# Patient Record
Sex: Female | Born: 1950
Health system: Southern US, Community
[De-identification: ages and names within clinical notes are randomized; demographics above are authoritative.]

## PROBLEM LIST (undated history)

## (undated) DIAGNOSIS — K209 Esophagitis, unspecified without bleeding: Secondary | ICD-10-CM

## (undated) DIAGNOSIS — K2100 Gastro-esophageal reflux disease with esophagitis, without bleeding: Secondary | ICD-10-CM

## (undated) DIAGNOSIS — N3000 Acute cystitis without hematuria: Secondary | ICD-10-CM

## (undated) DIAGNOSIS — Z860100 Personal history of colon polyps, unspecified: Secondary | ICD-10-CM

## (undated) DIAGNOSIS — R42 Dizziness and giddiness: Secondary | ICD-10-CM

## (undated) DIAGNOSIS — T7840XA Allergy, unspecified, initial encounter: Secondary | ICD-10-CM

## (undated) DIAGNOSIS — J9600 Acute respiratory failure, unspecified whether with hypoxia or hypercapnia: Secondary | ICD-10-CM

## (undated) DIAGNOSIS — J189 Pneumonia, unspecified organism: Secondary | ICD-10-CM

## (undated) DIAGNOSIS — F32A Depression, unspecified: Secondary | ICD-10-CM

## (undated) DIAGNOSIS — K8050 Calculus of bile duct without cholangitis or cholecystitis without obstruction: Secondary | ICD-10-CM

## (undated) DIAGNOSIS — M549 Dorsalgia, unspecified: Secondary | ICD-10-CM

## (undated) DIAGNOSIS — I10 Essential (primary) hypertension: Secondary | ICD-10-CM

## (undated) DIAGNOSIS — K264 Chronic or unspecified duodenal ulcer with hemorrhage: Secondary | ICD-10-CM

## (undated) DIAGNOSIS — Z79899 Other long term (current) drug therapy: Secondary | ICD-10-CM

## (undated) DIAGNOSIS — IMO0001 Reserved for inherently not codable concepts without codable children: Secondary | ICD-10-CM

## (undated) DIAGNOSIS — F419 Anxiety disorder, unspecified: Secondary | ICD-10-CM

## (undated) DIAGNOSIS — I509 Heart failure, unspecified: Secondary | ICD-10-CM

## (undated) DIAGNOSIS — D649 Anemia, unspecified: Secondary | ICD-10-CM

## (undated) DIAGNOSIS — K589 Irritable bowel syndrome without diarrhea: Secondary | ICD-10-CM

## (undated) DIAGNOSIS — K2970 Gastritis, unspecified, without bleeding: Secondary | ICD-10-CM

## (undated) DIAGNOSIS — M5431 Sciatica, right side: Secondary | ICD-10-CM

## (undated) DIAGNOSIS — K5730 Diverticulosis of large intestine without perforation or abscess without bleeding: Secondary | ICD-10-CM

## (undated) DIAGNOSIS — F329 Major depressive disorder, single episode, unspecified: Secondary | ICD-10-CM

## (undated) DIAGNOSIS — M1990 Unspecified osteoarthritis, unspecified site: Secondary | ICD-10-CM

## (undated) DIAGNOSIS — E538 Deficiency of other specified B group vitamins: Secondary | ICD-10-CM

## (undated) DIAGNOSIS — K219 Gastro-esophageal reflux disease without esophagitis: Secondary | ICD-10-CM

## (undated) DIAGNOSIS — F341 Dysthymic disorder: Secondary | ICD-10-CM

## (undated) DIAGNOSIS — G894 Chronic pain syndrome: Secondary | ICD-10-CM

## (undated) DIAGNOSIS — Z87898 Personal history of other specified conditions: Secondary | ICD-10-CM

## (undated) DIAGNOSIS — Z5189 Encounter for other specified aftercare: Secondary | ICD-10-CM

## (undated) DIAGNOSIS — M8580 Other specified disorders of bone density and structure, unspecified site: Secondary | ICD-10-CM

## (undated) DIAGNOSIS — F1020 Alcohol dependence, uncomplicated: Secondary | ICD-10-CM

## (undated) DIAGNOSIS — J42 Unspecified chronic bronchitis: Secondary | ICD-10-CM

## (undated) DIAGNOSIS — Z973 Presence of spectacles and contact lenses: Secondary | ICD-10-CM

## (undated) DIAGNOSIS — F17200 Nicotine dependence, unspecified, uncomplicated: Secondary | ICD-10-CM

## (undated) HISTORY — PX: UPPER GASTROINTESTINAL ENDOSCOPY: SHX188

## (undated) HISTORY — PX: APPENDECTOMY: SHX54

## (undated) HISTORY — DX: Gastritis, unspecified, without bleeding: K29.70

## (undated) HISTORY — PX: TUBAL LIGATION: SHX77

## (undated) HISTORY — DX: Gastro-esophageal reflux disease with esophagitis: K21.0

## (undated) HISTORY — DX: Unspecified osteoarthritis, unspecified site: M19.90

## (undated) HISTORY — DX: Depression, unspecified: F32.A

## (undated) HISTORY — DX: Major depressive disorder, single episode, unspecified: F32.9

## (undated) HISTORY — DX: Nicotine dependence, unspecified, uncomplicated: F17.200

## (undated) HISTORY — DX: Sciatica, right side: M54.31

## (undated) HISTORY — PX: COLONOSCOPY: SHX174

## (undated) HISTORY — DX: Chronic pain syndrome: G89.4

## (undated) HISTORY — DX: Encounter for other specified aftercare: Z51.89

## (undated) HISTORY — DX: Dysthymic disorder: F34.1

## (undated) HISTORY — DX: Calculus of bile duct without cholangitis or cholecystitis without obstruction: K80.50

## (undated) HISTORY — DX: Reserved for inherently not codable concepts without codable children: IMO0001

## (undated) HISTORY — DX: Anemia, unspecified: D64.9

## (undated) HISTORY — PX: TONSILLECTOMY: SUR1361

## (undated) HISTORY — DX: Unspecified chronic bronchitis: J42

## (undated) HISTORY — DX: Dizziness and giddiness: R42

## (undated) HISTORY — DX: Pneumonia, unspecified organism: J18.9

## (undated) HISTORY — DX: Gastro-esophageal reflux disease without esophagitis: K21.9

## (undated) HISTORY — DX: Allergy, unspecified, initial encounter: T78.40XA

## (undated) HISTORY — DX: Irritable bowel syndrome, unspecified: K58.9

## (undated) HISTORY — DX: Chronic or unspecified duodenal ulcer with hemorrhage: K26.4

## (undated) HISTORY — DX: Anxiety disorder, unspecified: F41.9

## (undated) HISTORY — DX: Personal history of colon polyps, unspecified: Z86.0100

## (undated) HISTORY — DX: Dorsalgia, unspecified: M54.9

## (undated) HISTORY — DX: Other specified disorders of bone density and structure, unspecified site: M85.80

## (undated) HISTORY — DX: Deficiency of other specified B group vitamins: E53.8

## (undated) HISTORY — DX: Gastro-esophageal reflux disease with esophagitis, without bleeding: K21.00

## (undated) HISTORY — DX: Diverticulosis of large intestine without perforation or abscess without bleeding: K57.30

## (undated) HISTORY — DX: Essential (primary) hypertension: I10

## (undated) HISTORY — DX: Personal history of colonic polyps: Z86.010

## (undated) HISTORY — DX: Acute cystitis without hematuria: N30.00

---

## 1999-05-27 ENCOUNTER — Other Ambulatory Visit: Admission: RE | Admit: 1999-05-27 | Discharge: 1999-05-27 | Payer: Self-pay | Admitting: Obstetrics and Gynecology

## 1999-09-04 ENCOUNTER — Encounter (INDEPENDENT_AMBULATORY_CARE_PROVIDER_SITE_OTHER): Payer: Self-pay

## 1999-09-04 ENCOUNTER — Other Ambulatory Visit: Admission: RE | Admit: 1999-09-04 | Discharge: 1999-09-04 | Payer: Self-pay | Admitting: Obstetrics and Gynecology

## 1999-11-11 ENCOUNTER — Other Ambulatory Visit: Admission: RE | Admit: 1999-11-11 | Discharge: 1999-11-11 | Payer: Self-pay | Admitting: Obstetrics and Gynecology

## 2000-02-09 ENCOUNTER — Emergency Department (HOSPITAL_COMMUNITY): Admission: EM | Admit: 2000-02-09 | Discharge: 2000-02-09 | Payer: Self-pay | Admitting: *Deleted

## 2000-02-09 ENCOUNTER — Encounter: Payer: Self-pay | Admitting: Emergency Medicine

## 2000-12-22 ENCOUNTER — Other Ambulatory Visit: Admission: RE | Admit: 2000-12-22 | Discharge: 2000-12-22 | Payer: Self-pay | Admitting: Obstetrics and Gynecology

## 2001-04-24 ENCOUNTER — Emergency Department (HOSPITAL_COMMUNITY): Admission: EM | Admit: 2001-04-24 | Discharge: 2001-04-24 | Payer: Self-pay | Admitting: Emergency Medicine

## 2001-06-20 ENCOUNTER — Other Ambulatory Visit: Admission: RE | Admit: 2001-06-20 | Discharge: 2001-06-20 | Payer: Self-pay | Admitting: Obstetrics and Gynecology

## 2001-06-20 ENCOUNTER — Encounter (INDEPENDENT_AMBULATORY_CARE_PROVIDER_SITE_OTHER): Payer: Self-pay | Admitting: Specialist

## 2002-04-05 ENCOUNTER — Other Ambulatory Visit: Admission: RE | Admit: 2002-04-05 | Discharge: 2002-04-05 | Payer: Self-pay | Admitting: Obstetrics and Gynecology

## 2003-04-17 ENCOUNTER — Emergency Department (HOSPITAL_COMMUNITY): Admission: EM | Admit: 2003-04-17 | Discharge: 2003-04-17 | Payer: Self-pay | Admitting: Emergency Medicine

## 2003-04-19 ENCOUNTER — Ambulatory Visit (HOSPITAL_COMMUNITY): Admission: RE | Admit: 2003-04-19 | Discharge: 2003-04-19 | Payer: Self-pay | Admitting: Pulmonary Disease

## 2003-04-19 ENCOUNTER — Encounter: Payer: Self-pay | Admitting: Pulmonary Disease

## 2003-04-23 ENCOUNTER — Encounter: Payer: Self-pay | Admitting: Pulmonary Disease

## 2003-04-23 ENCOUNTER — Inpatient Hospital Stay (HOSPITAL_COMMUNITY): Admission: EM | Admit: 2003-04-23 | Discharge: 2003-04-26 | Payer: Self-pay | Admitting: Pulmonary Disease

## 2003-04-25 ENCOUNTER — Encounter: Payer: Self-pay | Admitting: Gastroenterology

## 2003-05-08 ENCOUNTER — Other Ambulatory Visit: Admission: RE | Admit: 2003-05-08 | Discharge: 2003-05-08 | Payer: Self-pay | Admitting: Obstetrics and Gynecology

## 2003-09-18 ENCOUNTER — Encounter: Payer: Self-pay | Admitting: Gastroenterology

## 2003-09-18 ENCOUNTER — Ambulatory Visit (HOSPITAL_COMMUNITY): Admission: RE | Admit: 2003-09-18 | Discharge: 2003-09-18 | Payer: Self-pay | Admitting: Gastroenterology

## 2004-07-09 ENCOUNTER — Other Ambulatory Visit: Admission: RE | Admit: 2004-07-09 | Discharge: 2004-07-09 | Payer: Self-pay | Admitting: Obstetrics and Gynecology

## 2004-09-23 ENCOUNTER — Encounter: Admission: RE | Admit: 2004-09-23 | Discharge: 2004-09-23 | Payer: Self-pay | Admitting: Family Medicine

## 2004-09-24 ENCOUNTER — Inpatient Hospital Stay (HOSPITAL_COMMUNITY): Admission: AD | Admit: 2004-09-24 | Discharge: 2004-09-26 | Payer: Self-pay | Admitting: Gastroenterology

## 2004-09-25 ENCOUNTER — Encounter: Payer: Self-pay | Admitting: Internal Medicine

## 2005-02-09 ENCOUNTER — Ambulatory Visit: Payer: Self-pay | Admitting: Gastroenterology

## 2005-02-10 ENCOUNTER — Encounter: Admission: RE | Admit: 2005-02-10 | Discharge: 2005-02-10 | Payer: Self-pay | Admitting: Family Medicine

## 2005-02-16 ENCOUNTER — Ambulatory Visit: Payer: Self-pay | Admitting: Gastroenterology

## 2005-02-17 ENCOUNTER — Ambulatory Visit: Payer: Self-pay | Admitting: Gastroenterology

## 2005-03-23 ENCOUNTER — Ambulatory Visit: Payer: Self-pay | Admitting: Gastroenterology

## 2005-06-08 IMAGING — CT CT PELVIS W/ CM
1 of 4 series · 14 of 32 positions shown, 19 images · IV contrast (omnipaque)
Comparison: none

FINDINGS
CLINICAL DATA: PATIENT WITH ABDOMINAL PAIN.
CT SCAN OF THE ABDOMEN AND PELVIS WITH CONTRAST
COMPARISON IS MADE WITH A PRIOR EXAMINATION DATED 04/25/03.
CT SCAN OF THE ABDOMEN WITH CONTRAST
MULTIPLE SPIRAL IMAGES WERE MADE THROUGH THE ABDOMEN AFTER INTRAVENOUS INJECTION OF 100 CC OF
OMNIPAQUE 300.  ORAL CONTRAST WAS ALSO USED.  THE LUNG BASES ARE NORMAL.  THERE IS NO BONE
ABNORMALITY DEMONSTRATED.  THERE IS A SMALL CYST IN THE DOME OF THE LIVER.  ALSO DEMONSTRATED ARE
SMALL SUBCENTIMETER BILATERAL RENAL CYSTS.  THIS 9MM LOW-DENSITY AREA WAS IN THE PANCREAS AT THE
JUNCTION BETWEEN THE BODY AND TAIL.  IT WAS MINIMALLY SEEN ON THE PRIOR EXAMINATION OF 04/25/03 AND
BARELY EVIDENT ON THE SCAN OF 04/19/03.  THERE IS NO ADENOPATHY, INFLAMMATORY CHANGE, OR FREE FLUID.
IMPRESSION
1.  NO ACUTE ABNORMALITY WITHIN THE ABDOMEN WITH HEPATIC AND RENAL CYSTS SEEN PREVIOUSLY.
2.  THERE IS A 9MM LOW-DENSITY LESION OF THE PANCREAS AT THE JUNCTION BETWEEN THE BODY AND TAIL.
IT IS MORE PROMINENT THAN SEEN ON THE PRIOR EXAMS.  THIS VERY WELL COULD REPRESENT A SMALL CYST,
BUT IS TOO SMALL TO CHARACTERIZE.  OPTIONS FOR FOLLOW-UP WOULD BE A FOUR TO SIX-MONTH FOLLOW-UP CT
SCAN VERSUS AN MR SCAN OF THE PANCREAS.
CT PELVIS WITH CONTRAST
ADDITIONAL IMAGES THROUGH THE PELVIS AFTER ORAL AND INTRAVENOUS CONTRAST DEMONSTRATE NO
INFLAMMATORY CHANGE OR FREE FLUID.  THERE IS NO MASS OR ADENOPATHY.
NO EVIDENCE FOR DIVERTICULITIS OR ACUTE ABNORMALITY.

[Series 2: abd/pelvis 5.0 b30f · axial · 0.62mm/px · z∈[-334,+16]mm · 14 of 81 slices shown, 19 images]
[im 6/81  soft-tissue]
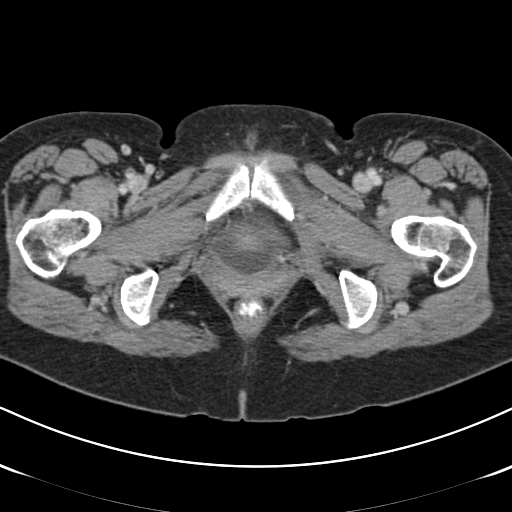
[im 6/81  bone]
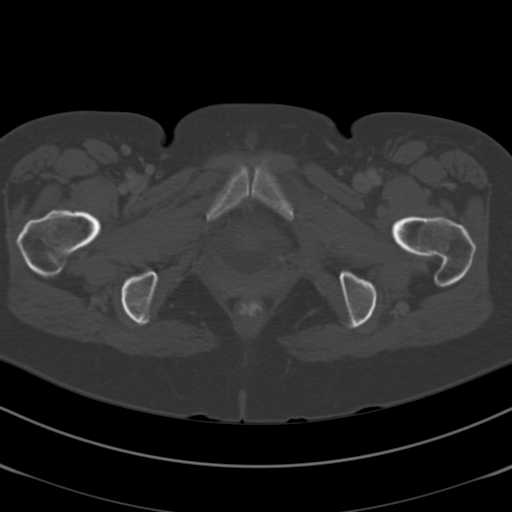
[im 11/81  soft-tissue]
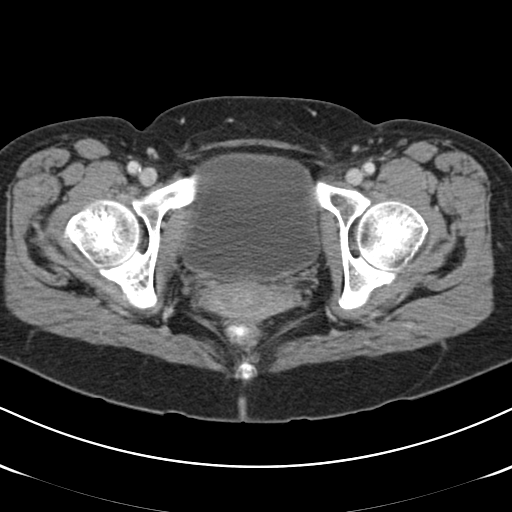
[im 16/81  soft-tissue]
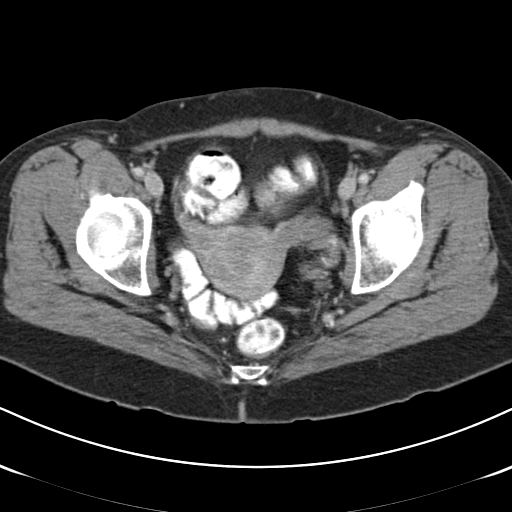
[im 26/81  soft-tissue]
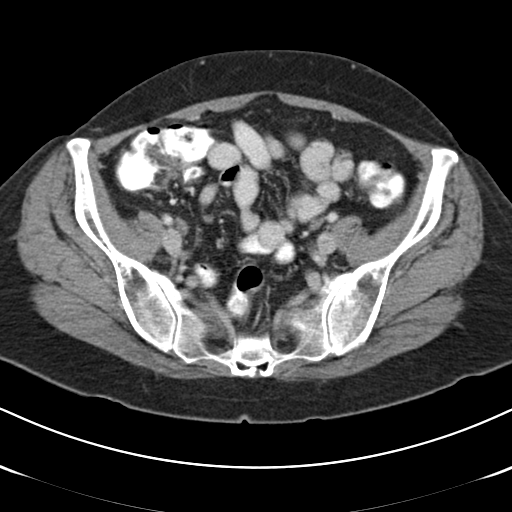
[im 31/81  soft-tissue]
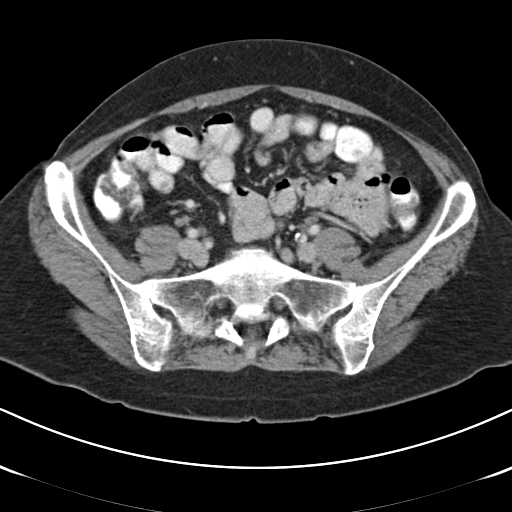
[im 36/81  soft-tissue]
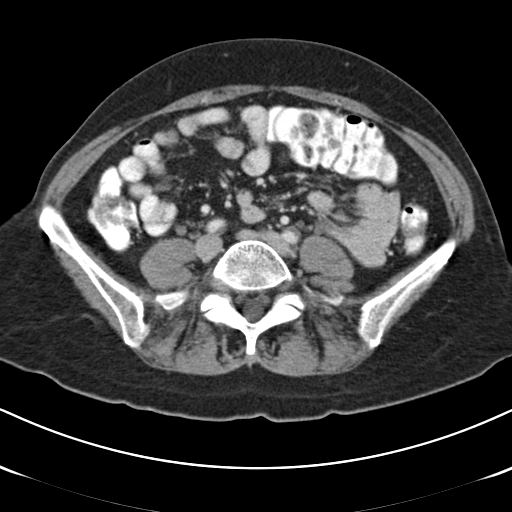
[im 41/81  soft-tissue]
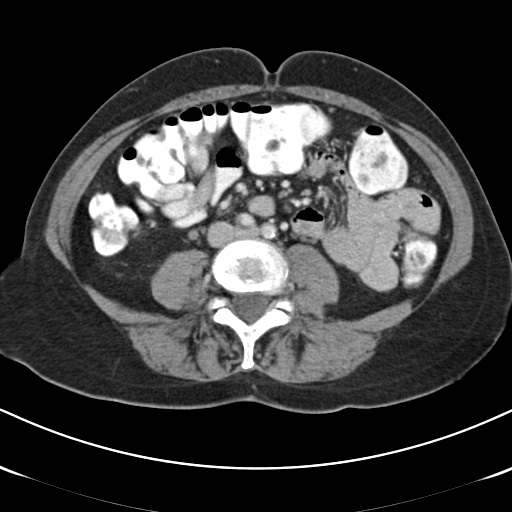
[im 46/81  soft-tissue]
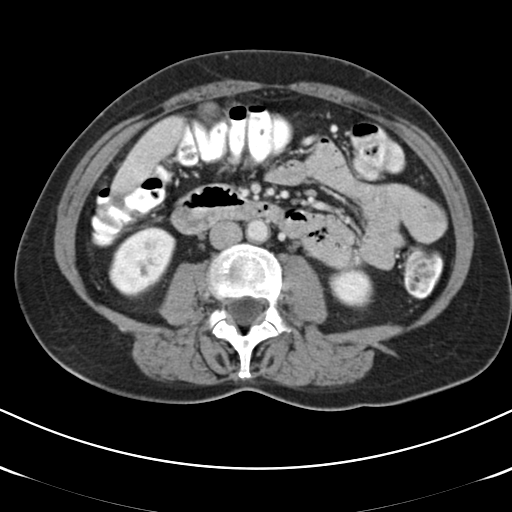
[im 51/81  soft-tissue]
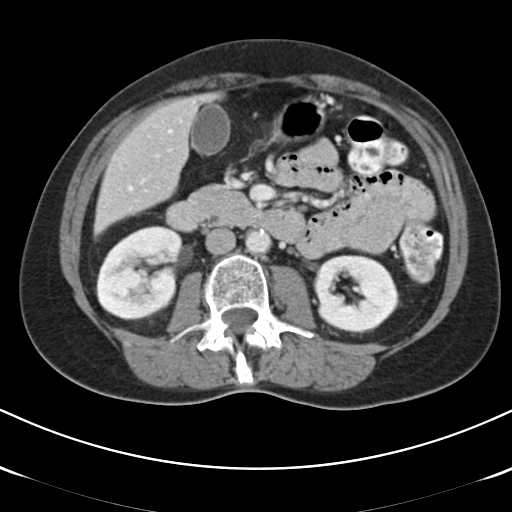
[im 51/81  bone]
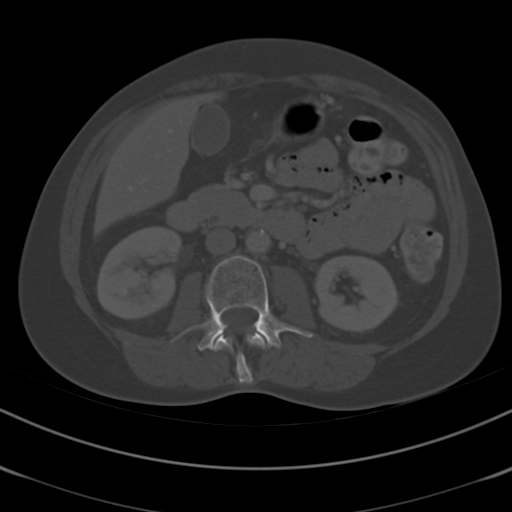
[im 56/81  soft-tissue]
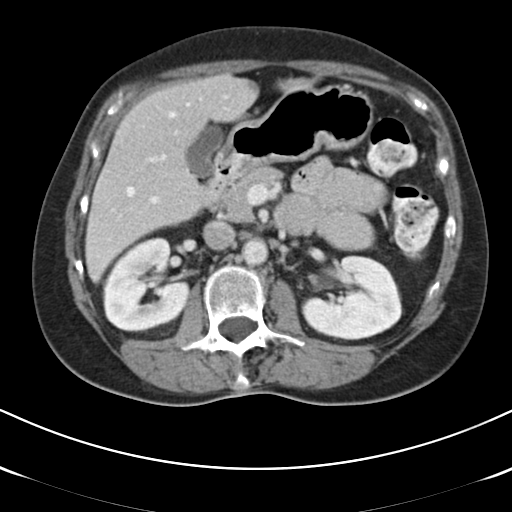
[im 61/81  lung]
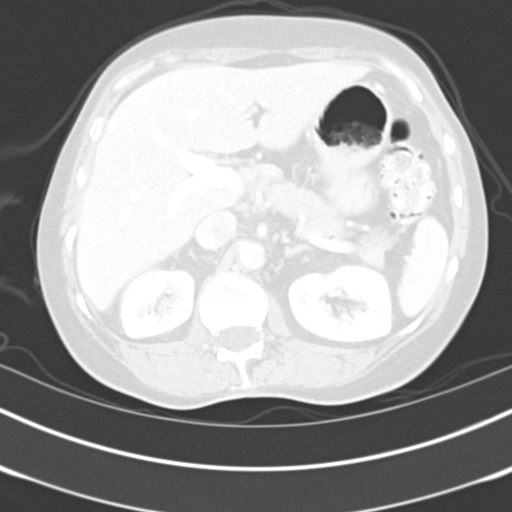
[im 66/81  soft-tissue]
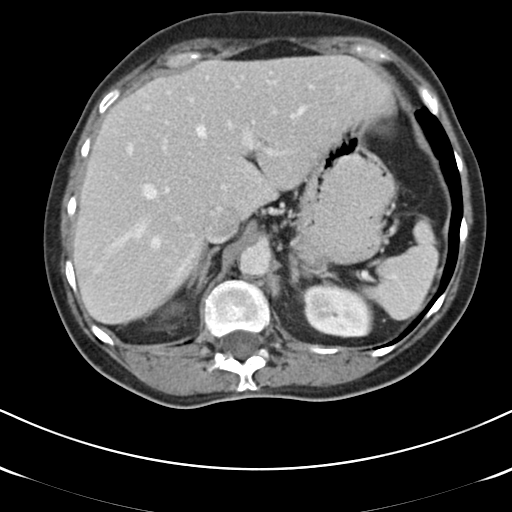
[im 66/81  lung]
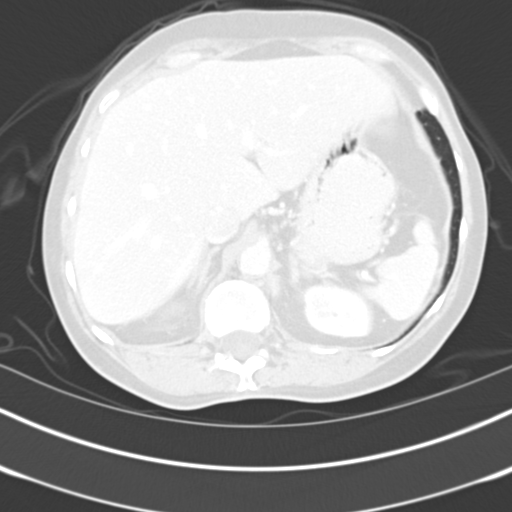
[im 71/81  soft-tissue]
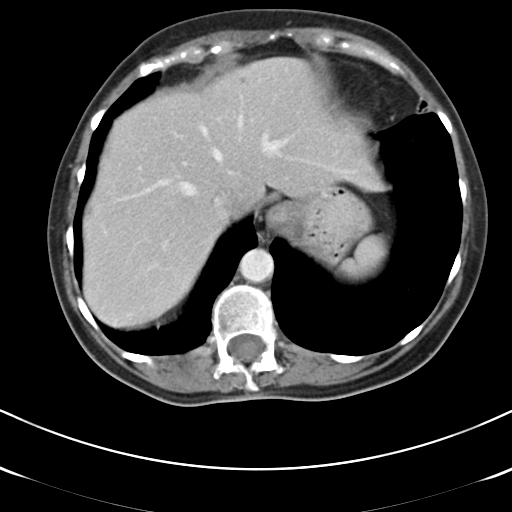
[im 71/81  lung]
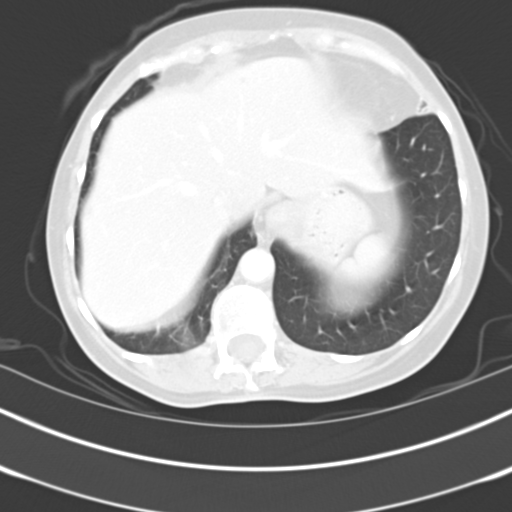
[im 76/81  soft-tissue]
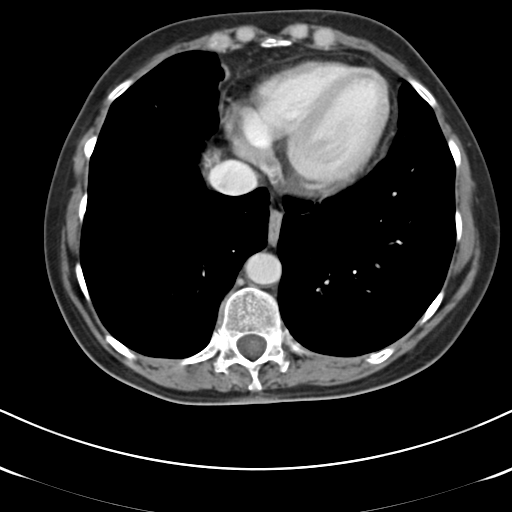
[im 76/81  lung]
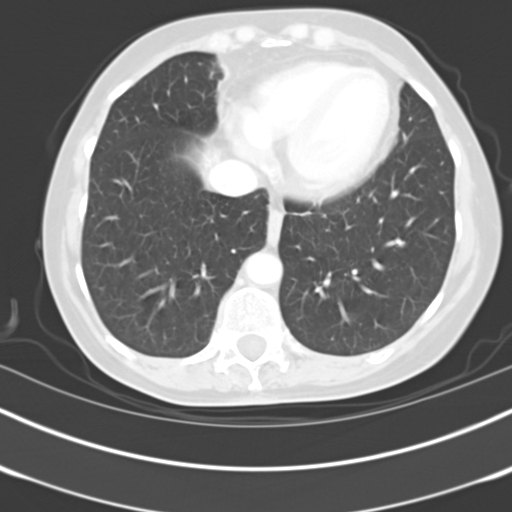

[14 of 32 positions shown; findings below may reference images not displayed]

## 2005-07-27 ENCOUNTER — Ambulatory Visit: Payer: Self-pay | Admitting: Pulmonary Disease

## 2005-09-25 ENCOUNTER — Other Ambulatory Visit: Admission: RE | Admit: 2005-09-25 | Discharge: 2005-09-25 | Payer: Self-pay | Admitting: Obstetrics and Gynecology

## 2005-12-18 ENCOUNTER — Ambulatory Visit: Payer: Self-pay | Admitting: Pulmonary Disease

## 2006-06-14 IMAGING — CT CT PELVIS W/ CM
1 of 2 series · 15 of 32 positions shown, 19 images · IV contrast (GASTROGRAFIN & [ID] OMNI 300)
Comparison: 09/18/03.

CLINICAL DATA: Left sided abdominal pain.
 CT ABDOMEN AND PELVIS WITH CONTRAST ? 09/23/04

[Series 2: a&p w/ · axial · 0.59mm/px · z∈[-363,-43]mm · 15 of 72 slices shown, 19 images]
[im 4/72  soft-tissue]
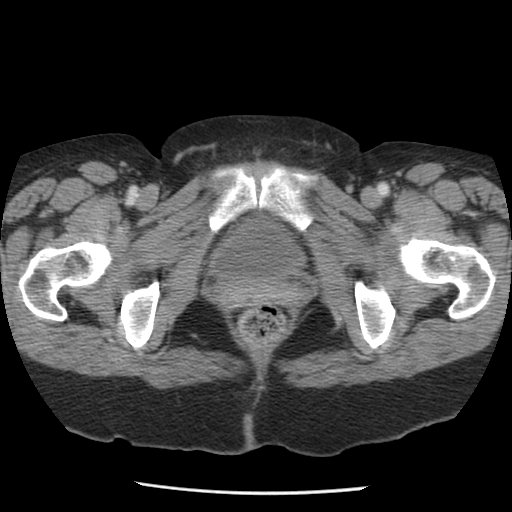
[im 4/72  bone]
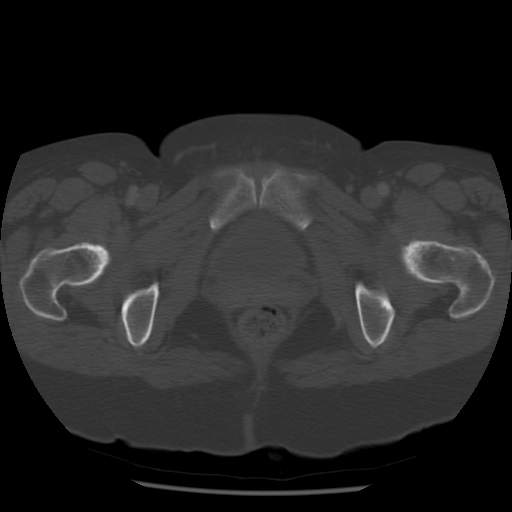
[im 10/72  soft-tissue]
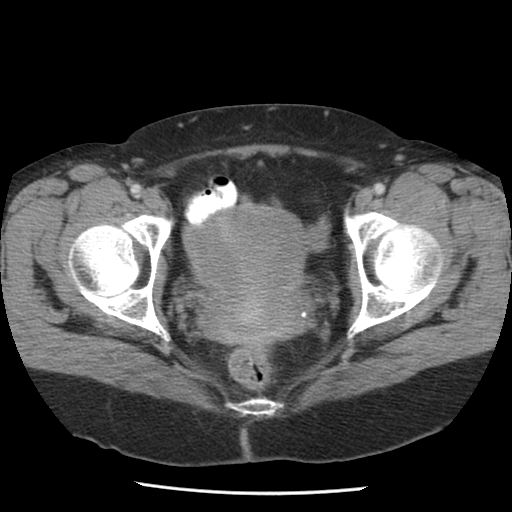
[im 17/72  soft-tissue]
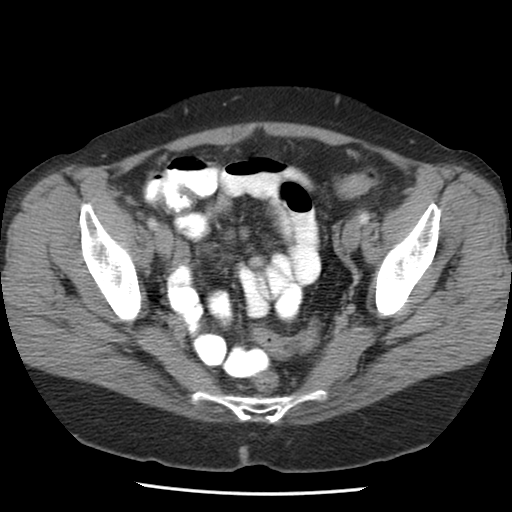
[im 20/72  soft-tissue]
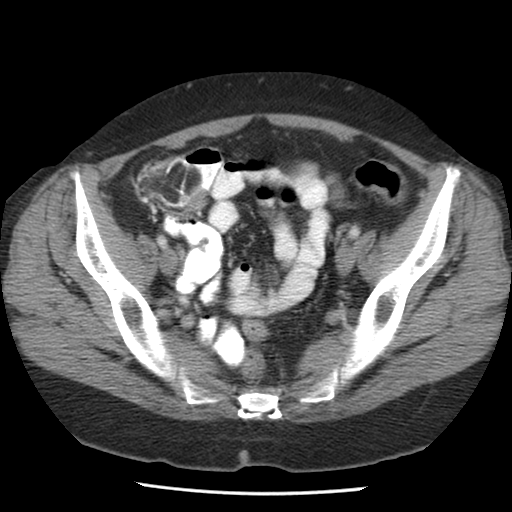
[im 26/72  soft-tissue]
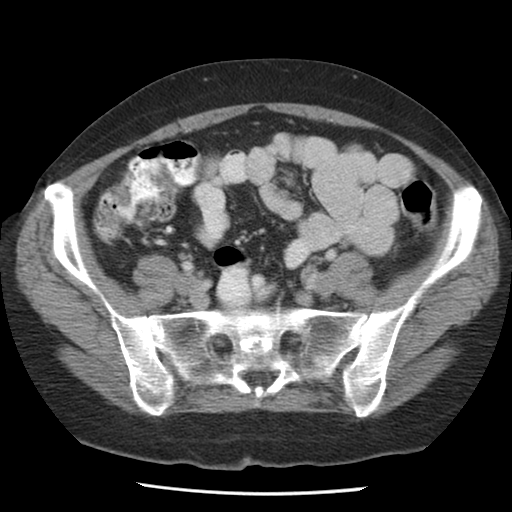
[im 30/72  soft-tissue]
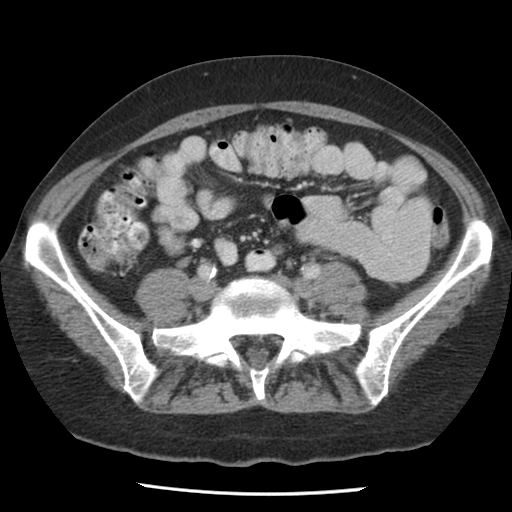
[im 36/72  soft-tissue]
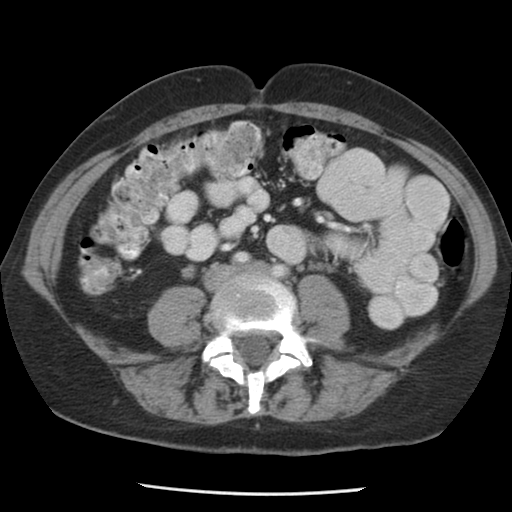
[im 42/72  soft-tissue]
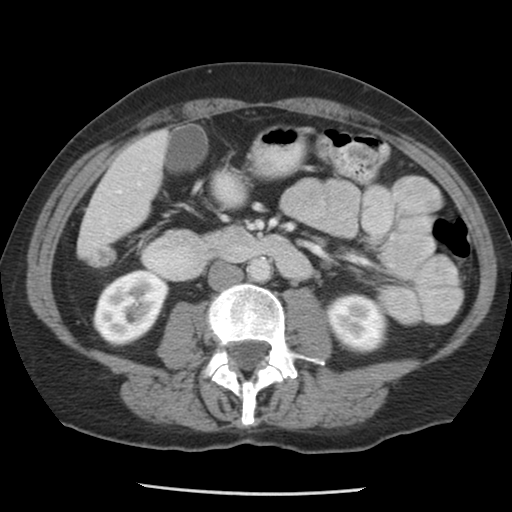
[im 46/72  soft-tissue]
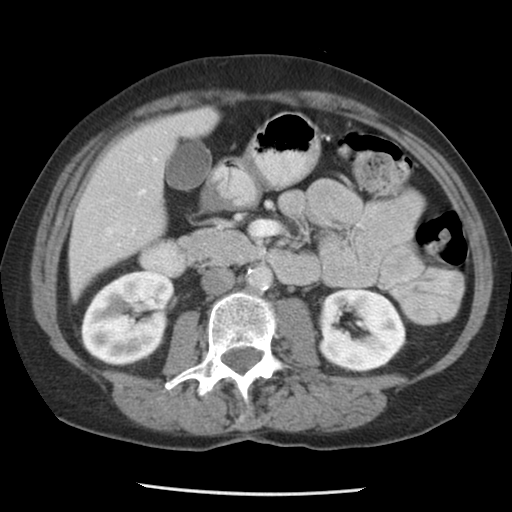
[im 46/72  bone]
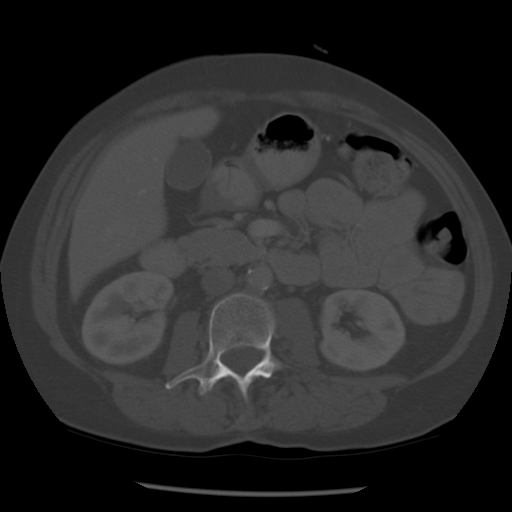
[im 52/72  soft-tissue]
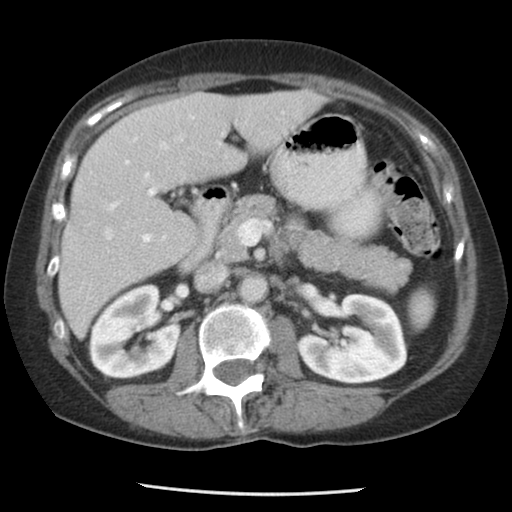
[im 55/72  soft-tissue]
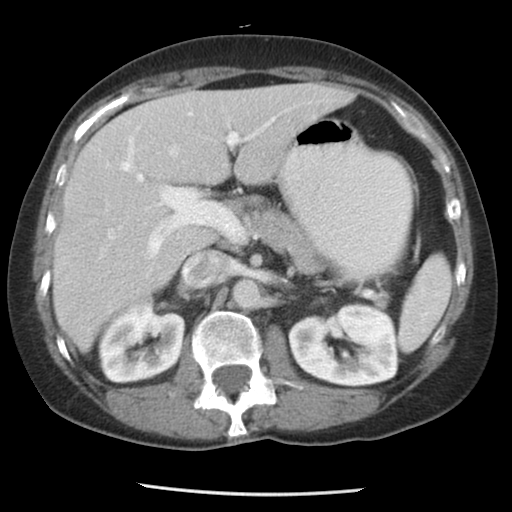
[im 59/72  lung]
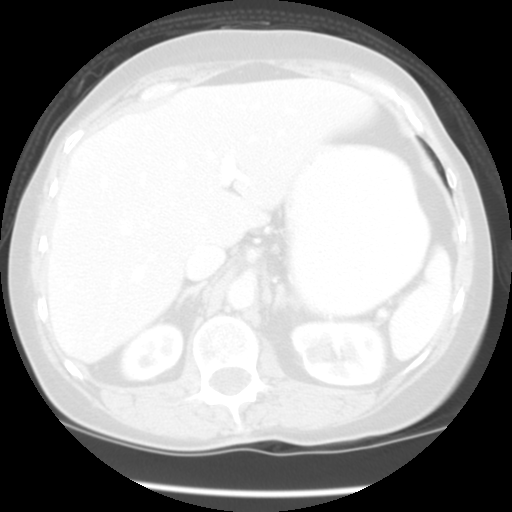
[im 62/72  soft-tissue]
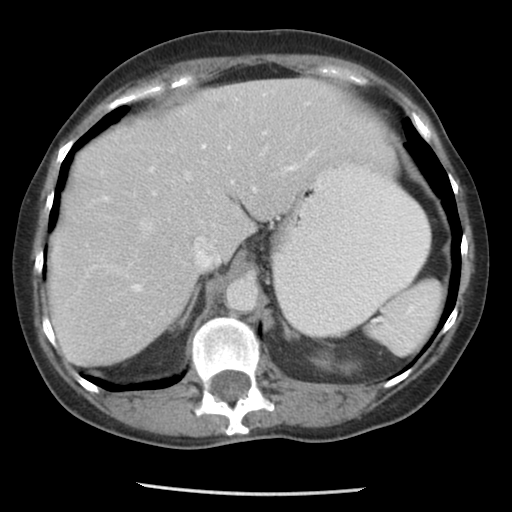
[im 62/72  lung]
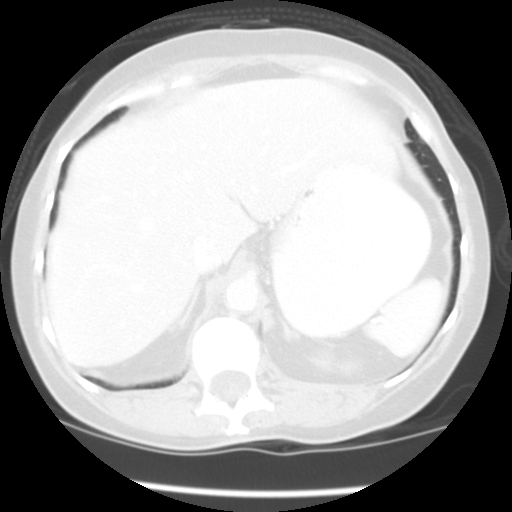
[im 65/72  lung]
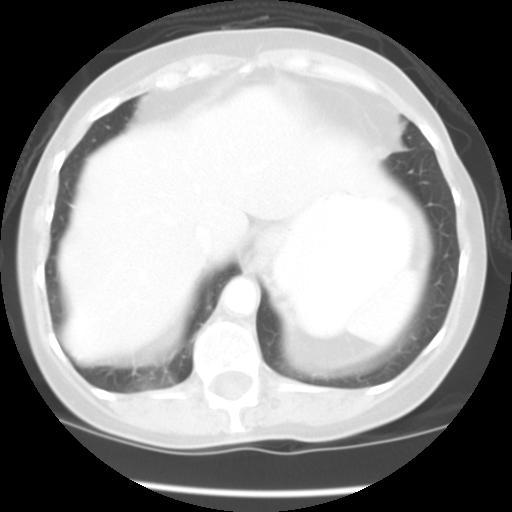
[im 68/72  soft-tissue]
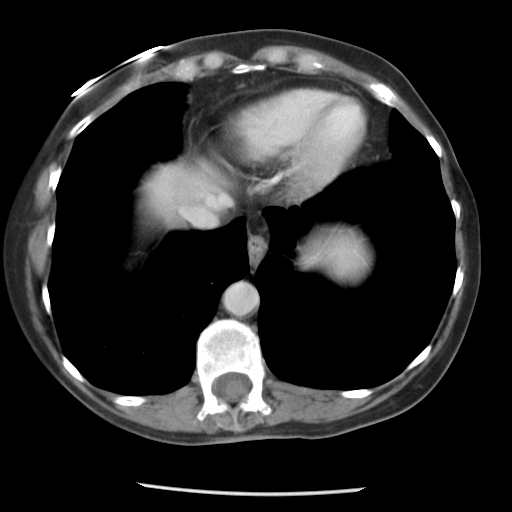
[im 68/72  lung]
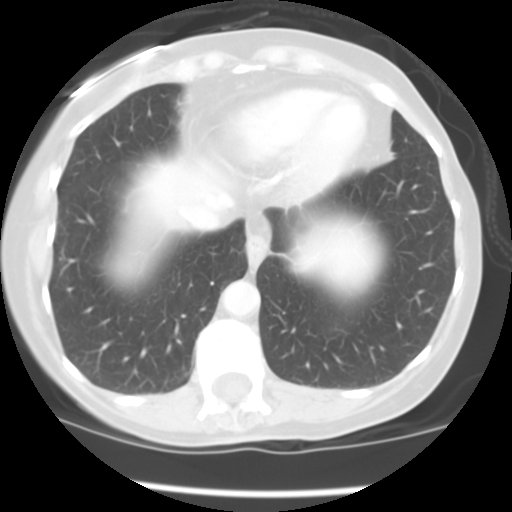

[15 of 32 positions shown; findings below may reference images not displayed]

Multidetector helical CT images performed through the abdomen and pelvis following dilute oral contrast and 100 cc Omnipaque 300 IV.  
 CT ABDOMEN
 There are multiple small cysts within the liver and kidneys.  Previously described small low density lesion within the body and tail of the pancreas is again noted, unchanged.  The pancreas is otherwise unremarkable.  Spleen and adrenal glands are unremarkable.  
 Gallbladder is grossly unremarkable.  There is wall thickening noted in the region of the pylorus and duodenal bulb, question mild gastritis/duodenitis.  Otherwise, bowel is grossly unremarkable.  No free fluid, free air, or adenopathy.
 IMPRESSION 
 1.  Pyloric/duodenal bulb thickening concerning for gastritis/duodenitis.
 2. Stable small low density lesion within the body and tail of the pancreas, most likely a cyst.
 3.  Stable hepatic and renal cysts.
 CT PELVIS
 Uterus and adnexa unremarkable.  No free fluid, free air, or adenopathy.  Bowel grossly unremarkable.
 IMPRESSION
 No acute abnormality in the pelvis.

## 2006-06-15 IMAGING — CR DG ABDOMEN 2V
2 series · 2 of 2 positions shown · non-contrast
Comparison: none

CLINICAL DATA: Abdominal pain, nausea, and vomiting.

[view not recorded (1 of 2)]
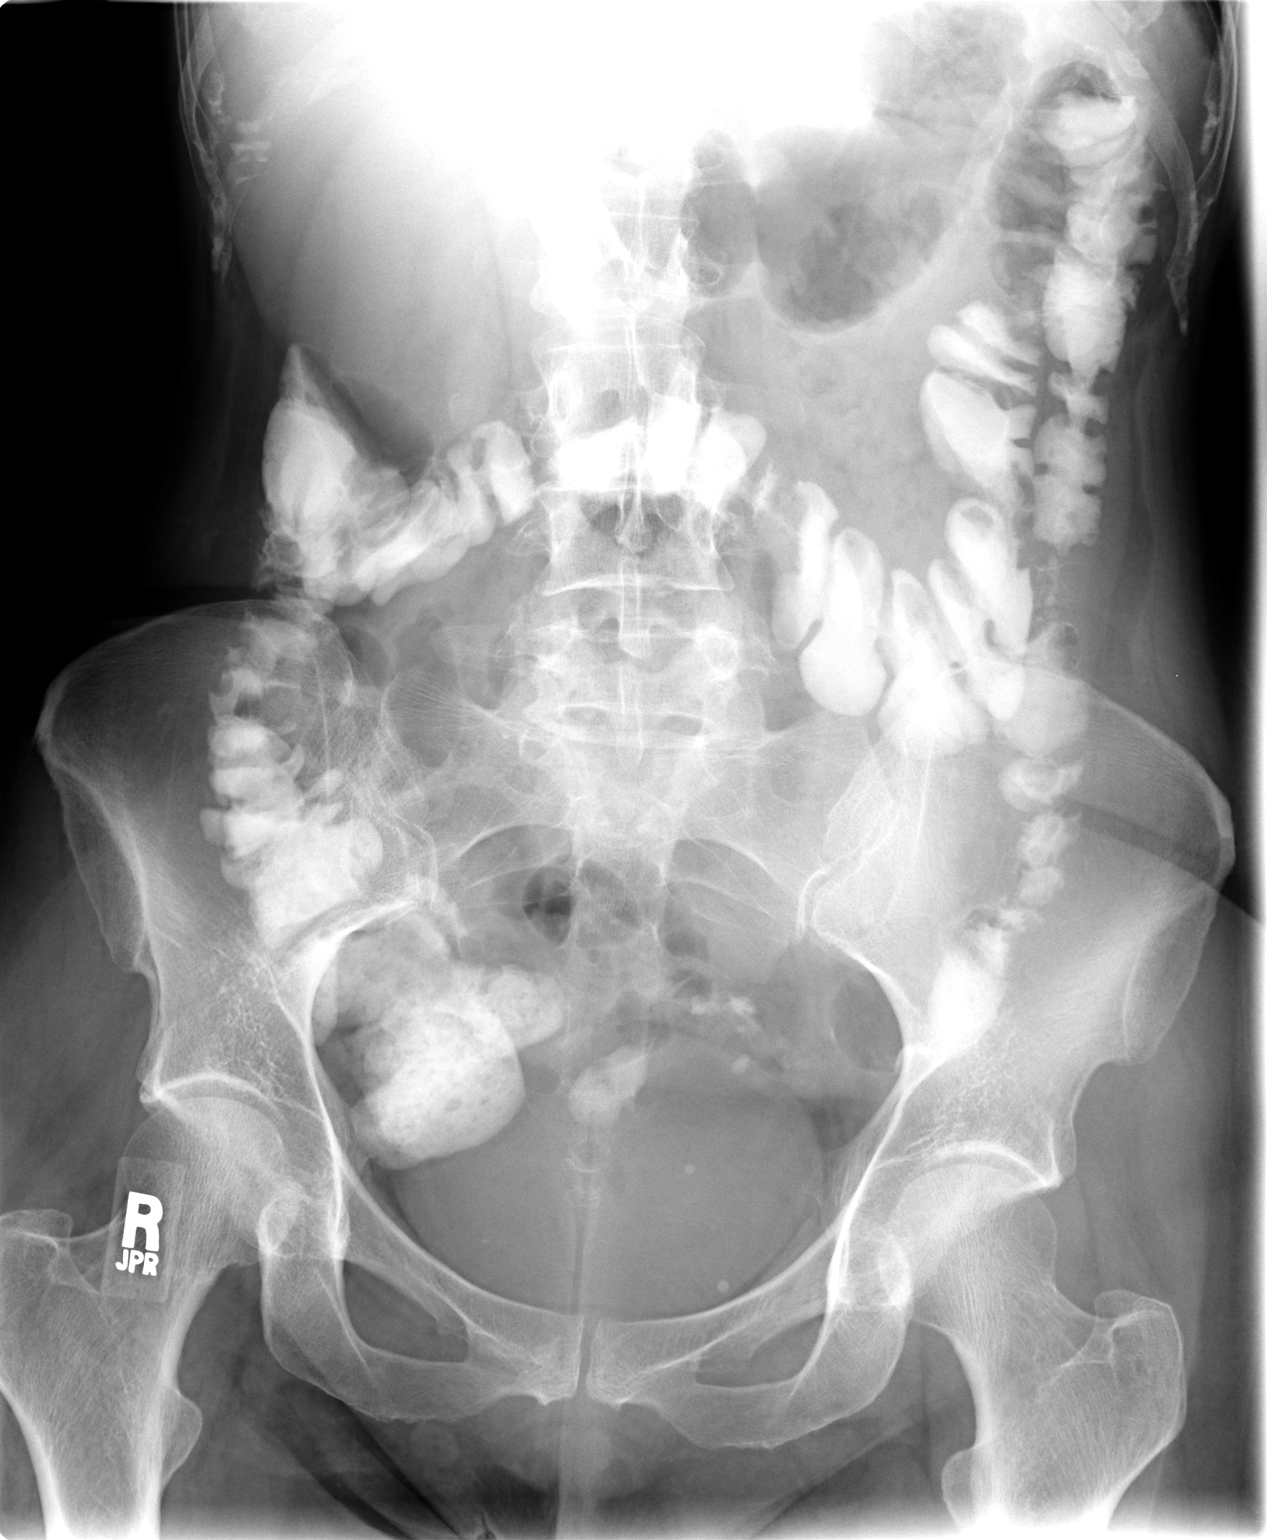

[view not recorded (2 of 2)]
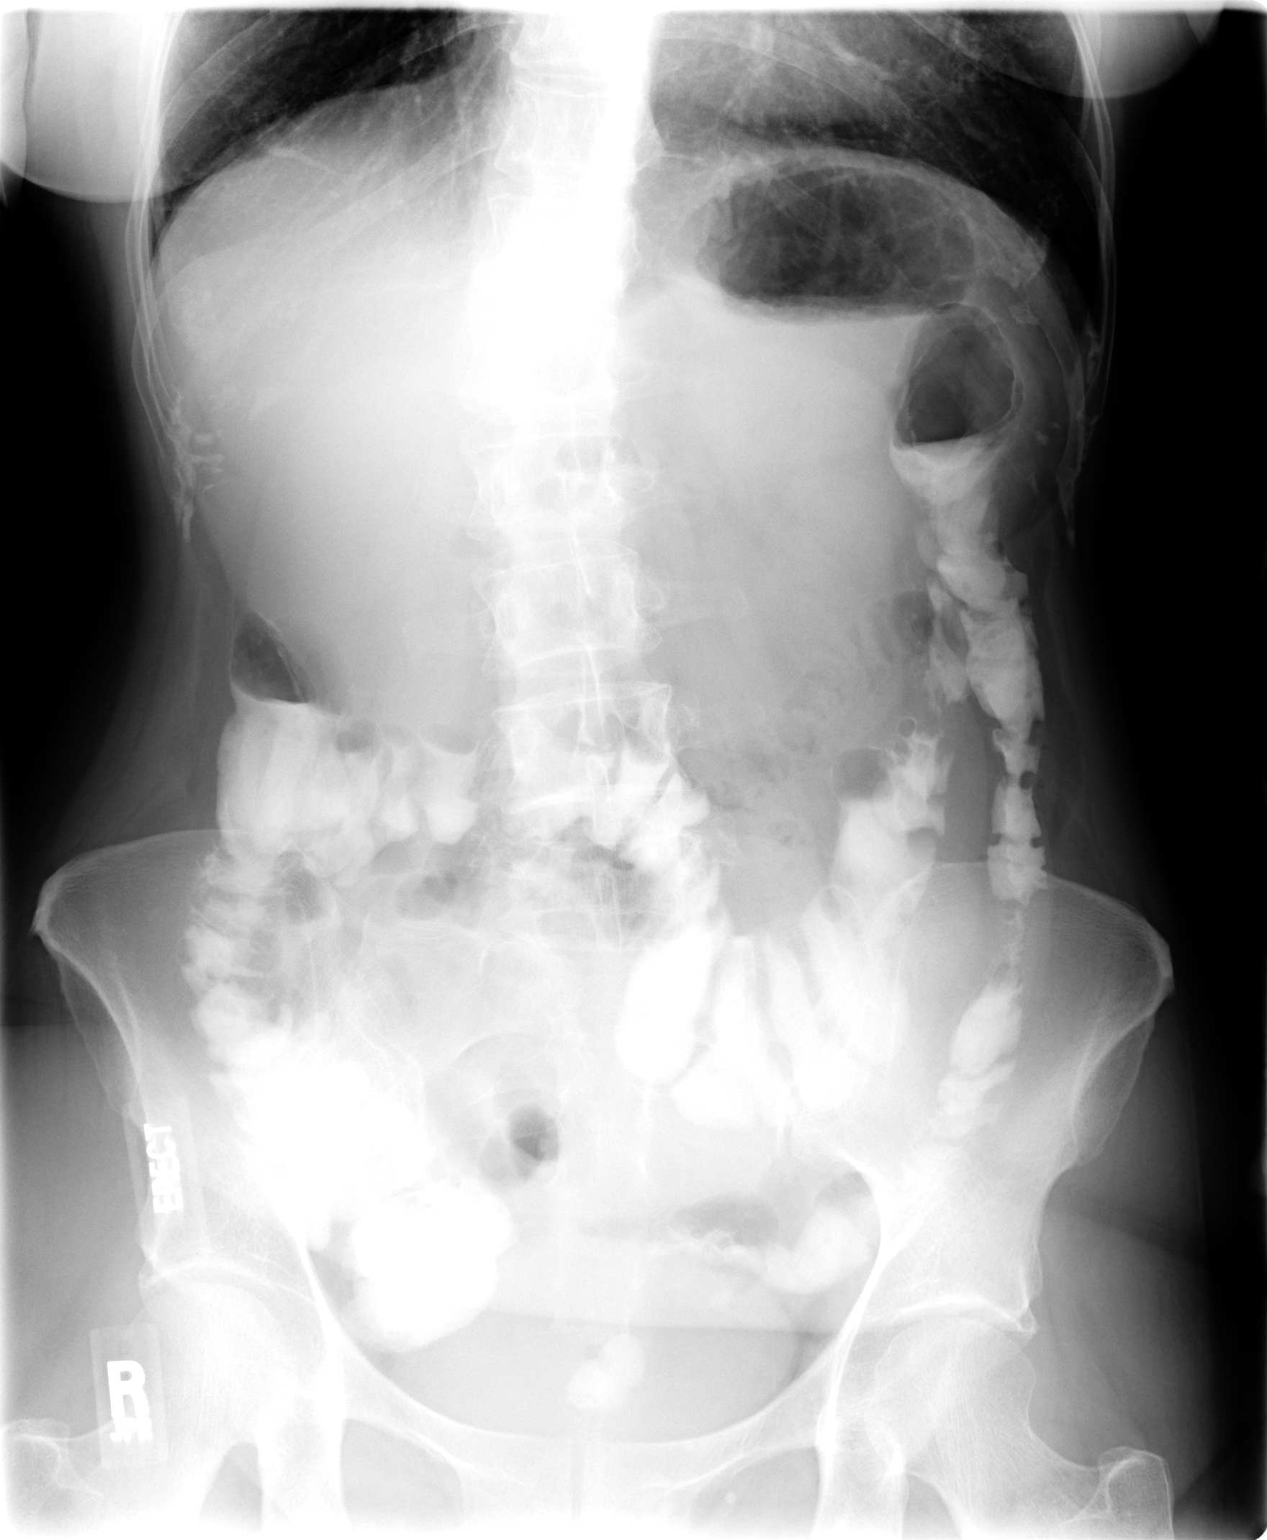

[2 of 2 positions shown; findings below may reference images not displayed]

TWO-VIEW ABDOMEN:

Supine and erect views of the abdomen are correlated with a CT performed 09/23/2004. There is oral
contrast within the colon related to the recent CT scan. No small bowel distention is demonstrated.
There is no free intraperitoneal air. Bilateral pelvic calcifications are stable from the CT and
compatible with phleboliths. The stomach is partially fluid filled.
IMPRESSION: No evidence of bowel obstruction or free intraperitoneal. The stomach remains partially fluid filled.

## 2006-06-15 IMAGING — US US ABDOMEN COMPLETE
1 series · 14 of 25 positions shown · non-contrast
Comparison: none

CLINICAL DATA: Abdominal pain nausea and vomiting.

ABDOMEN ULTRASOUND
TECHNIQUE: Complete abdominal ultrasound examination was performed including evaluation of the
liver, gallbladder, bile ducts, pancreas, kidneys, spleen, IVC, and abdominal aorta.

[Series 1: unknown · 0.33mm/px · 14 of 63 slices shown]
[im 1/63]
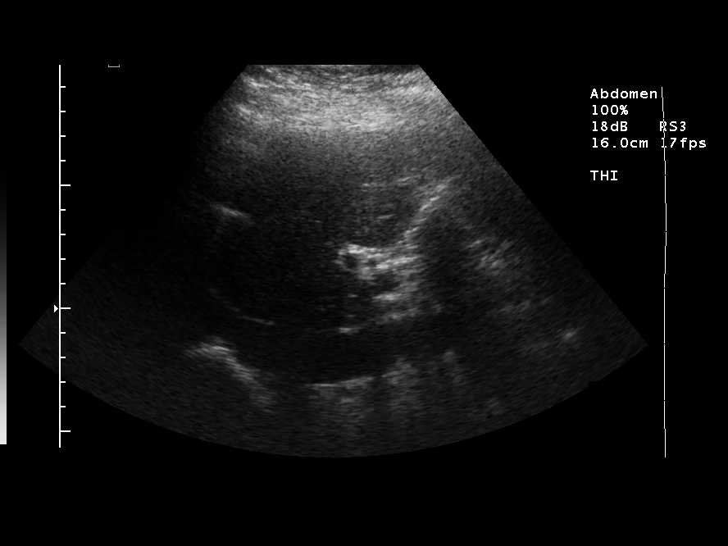
[im 6/63]
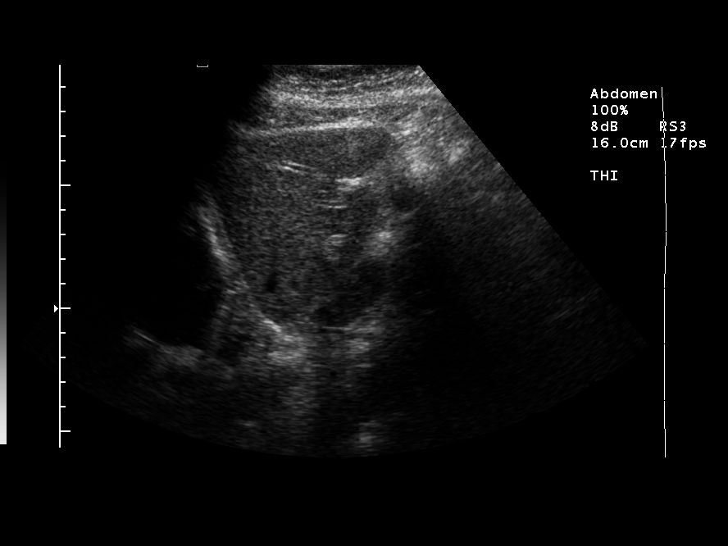
[im 11/63]
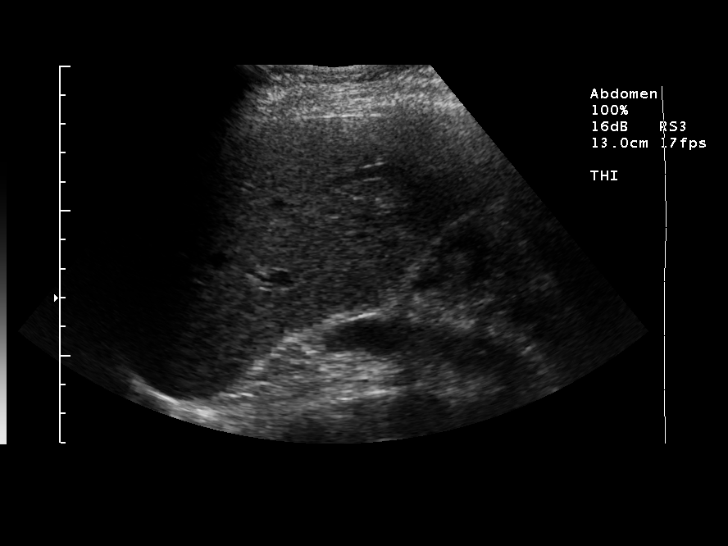
[im 16/63]
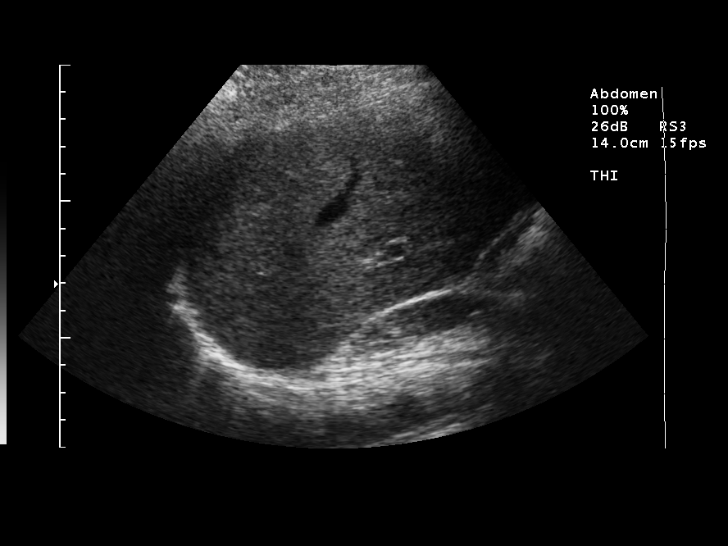
[im 21/63]
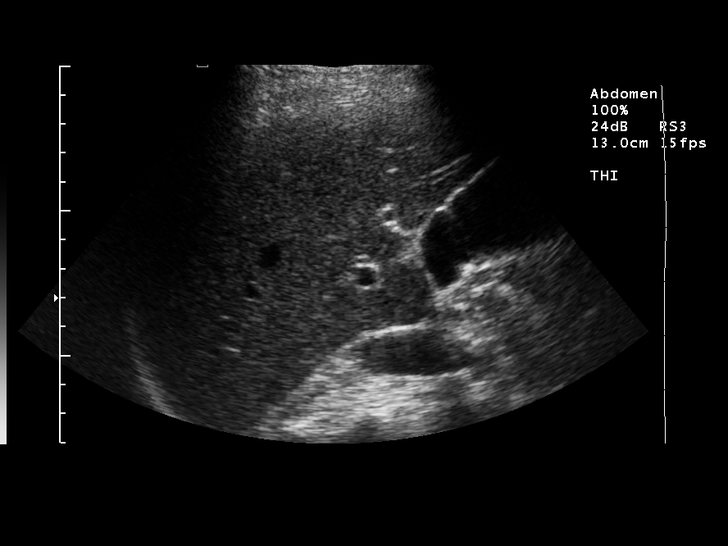
[im 24/63]
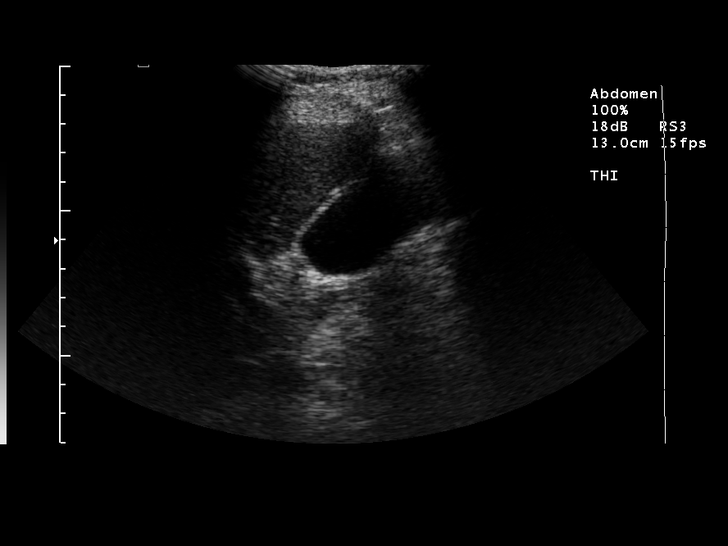
[im 29/63]
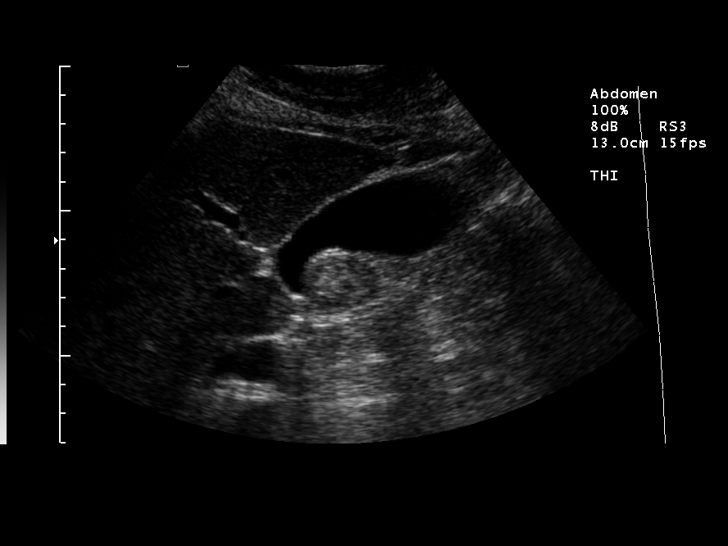
[im 34/63]
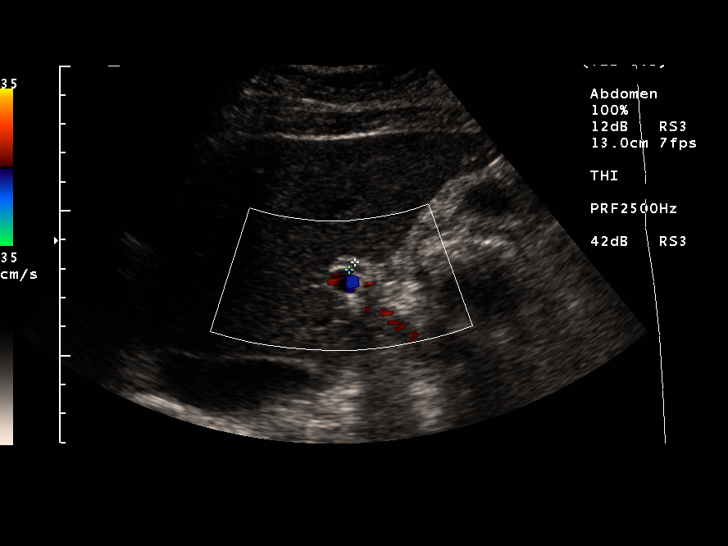
[im 39/63]
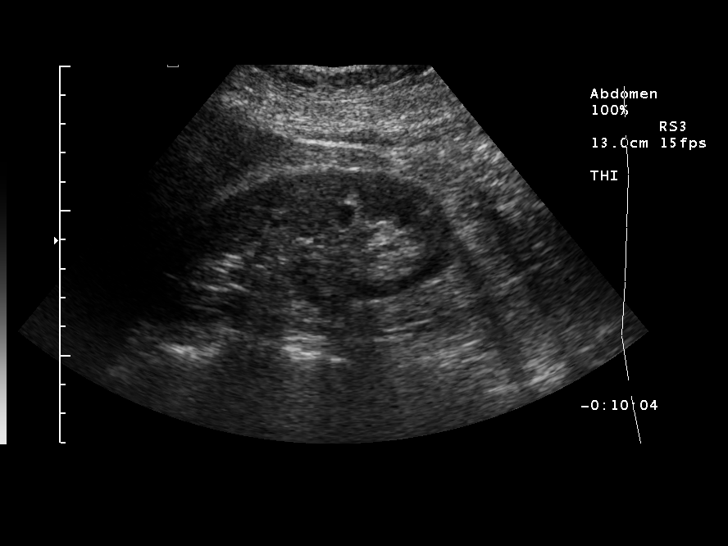
[im 42/63]
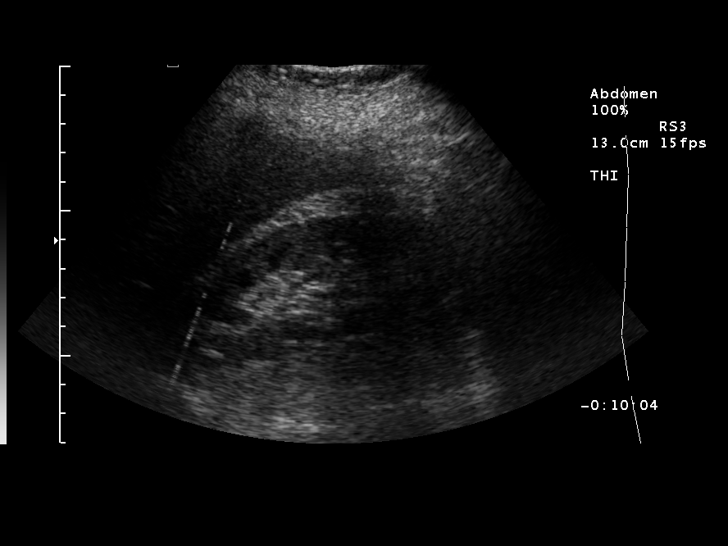
[im 47/63]
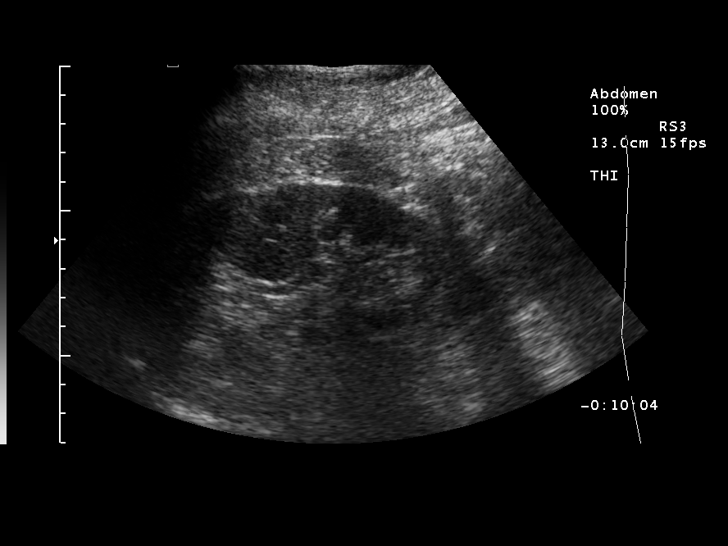
[im 52/63]
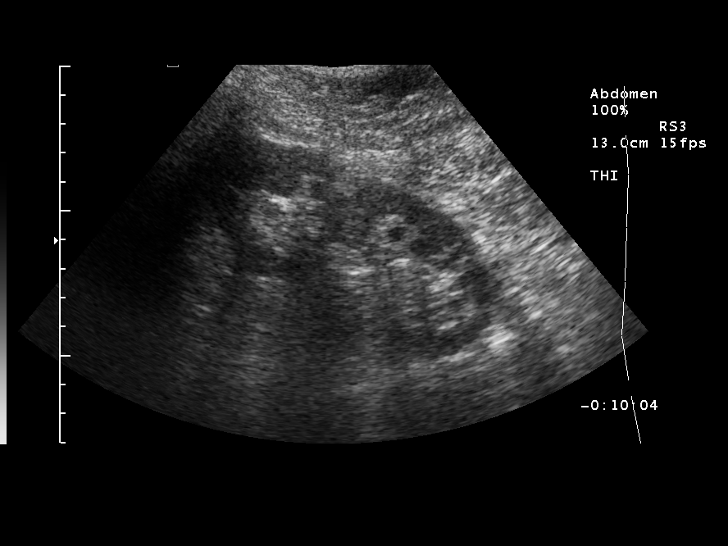
[im 57/63]
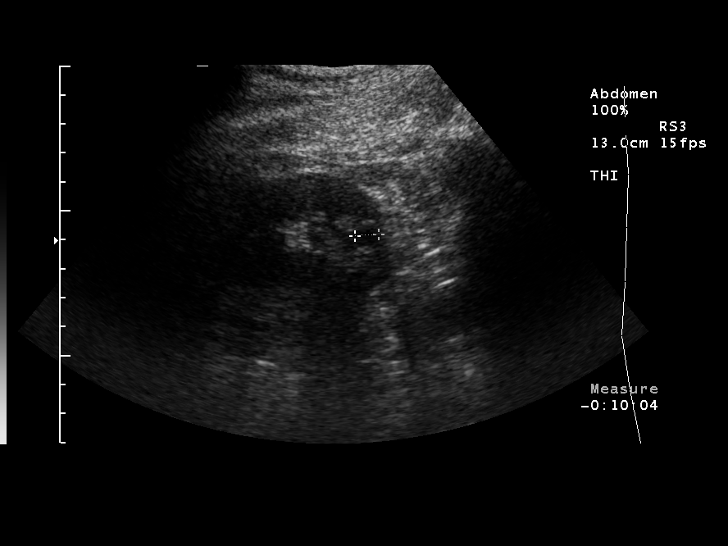
[im 63/63]
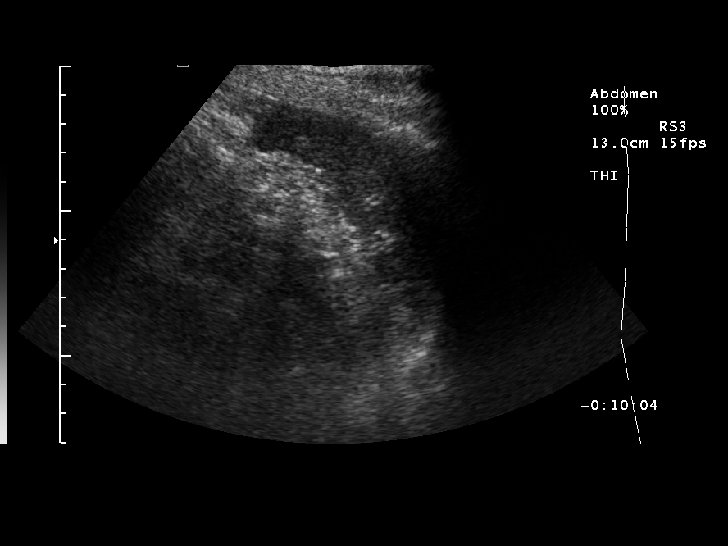

[14 of 25 positions shown; findings below may reference images not displayed]

FINDINGS: There is no evidence of gallstones or biliary ductal dilatation.  The liver is within
normal limits in echogenicity, and no focal liver lesions are seen.  The visualized portions of the
IVC and pancreas are unremarkable.

There is no evidence of splenomegaly.  Subcentimeter renal cysts are present bilaterally. The
kidneys are otherwise unremarkable, and there is no evidence of hydronephrosis.  The right kidney
measures 11.4 cm in length and the left kidney 11.5 cm in length. The abdominal aorta is
non-dilated.

IMPRESSION

Small renal cysts. Otherwise negative abdominal ultrasound.

## 2006-07-23 ENCOUNTER — Ambulatory Visit: Payer: Self-pay | Admitting: Pulmonary Disease

## 2006-08-02 ENCOUNTER — Ambulatory Visit: Payer: Self-pay | Admitting: Pulmonary Disease

## 2006-09-28 ENCOUNTER — Ambulatory Visit: Payer: Self-pay | Admitting: Gastroenterology

## 2006-10-04 ENCOUNTER — Emergency Department (HOSPITAL_COMMUNITY): Admission: EM | Admit: 2006-10-04 | Discharge: 2006-10-04 | Payer: Self-pay | Admitting: Emergency Medicine

## 2006-10-07 ENCOUNTER — Ambulatory Visit (HOSPITAL_COMMUNITY): Admission: RE | Admit: 2006-10-07 | Discharge: 2006-10-07 | Payer: Self-pay | Admitting: Gastroenterology

## 2006-10-07 ENCOUNTER — Encounter: Payer: Self-pay | Admitting: Gastroenterology

## 2006-10-08 ENCOUNTER — Ambulatory Visit: Payer: Self-pay | Admitting: Gastroenterology

## 2006-10-29 ENCOUNTER — Ambulatory Visit: Payer: Self-pay | Admitting: Gastroenterology

## 2006-11-01 IMAGING — CT CT PELVIS W/ CM
1 of 2 series · 15 of 32 positions shown, 19 images · IV contrast (GASTROGRAFIN & [ID] OMNI 300)
Comparison: none

CLINICAL DATA: Three days left lower quadrant pain, nausea/vomiting, with constipation.  History diverticulitis, appendectomy. 
 CT ABDOMEN W/CONTRAST: 
 Following oral contrast and IV administration of 077cc of Omnipaque 300 multidetector spiral axial images were obtained through the abdomen and comparison made with [REDACTED] abdominal CT 09/23/04.  Since prior study there is no longer present circumferential thickening at the pylorus duodenal bulb region with no persistent inflammatory change.  4mm dome of liver (image 11), 9mm low density probable slightly complex cyst at the pancreatic body tail junction (images 20 and 11 dynamic and delayed), and bilateral renal cortical cystic foci are stable.  The remaining abdominal organs appear normal with no inflammation nor adenopathy.

[Series 2: a&p w/ · axial · 0.59mm/px · z∈[-395,-60]mm · 15 of 74 slices shown, 19 images]
[im 4/74  soft-tissue]
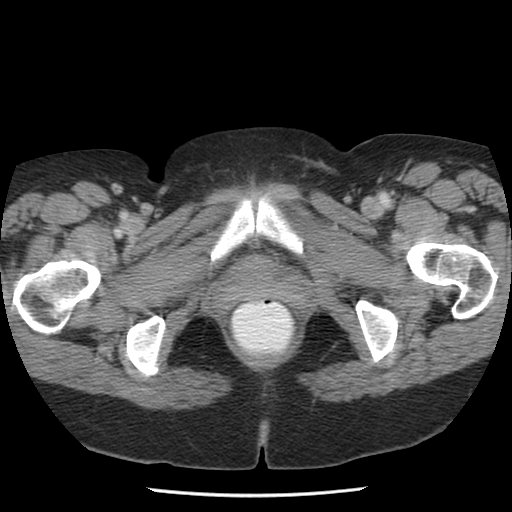
[im 4/74  bone]
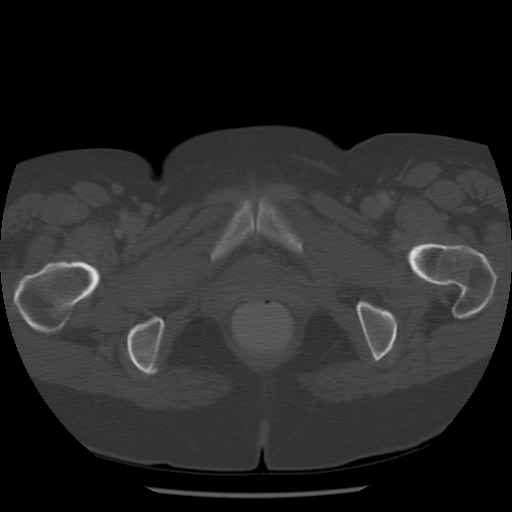
[im 10/74  soft-tissue]
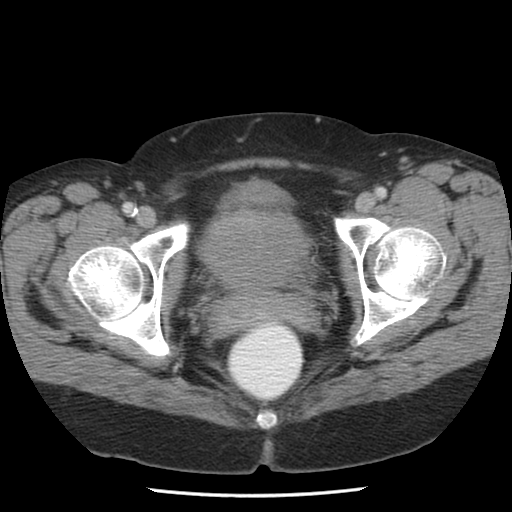
[im 17/74  soft-tissue]
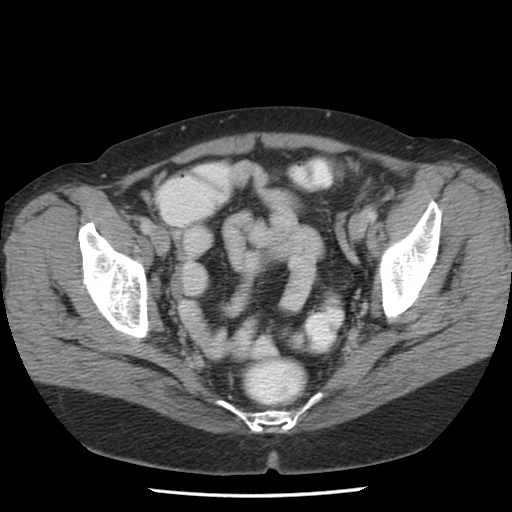
[im 20/74  soft-tissue]
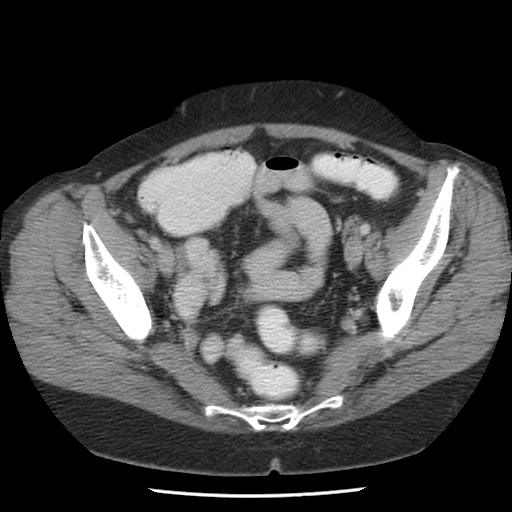
[im 27/74  soft-tissue]
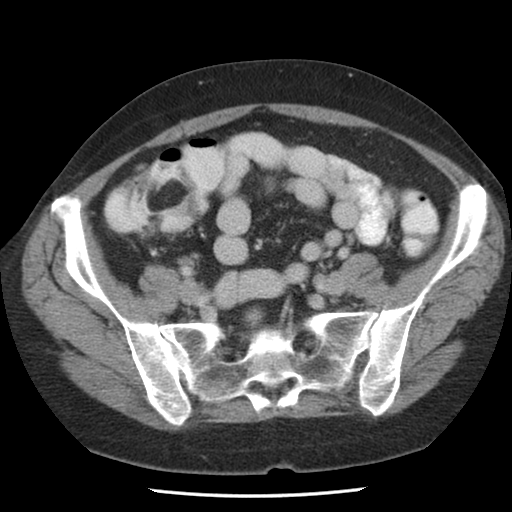
[im 30/74  soft-tissue]
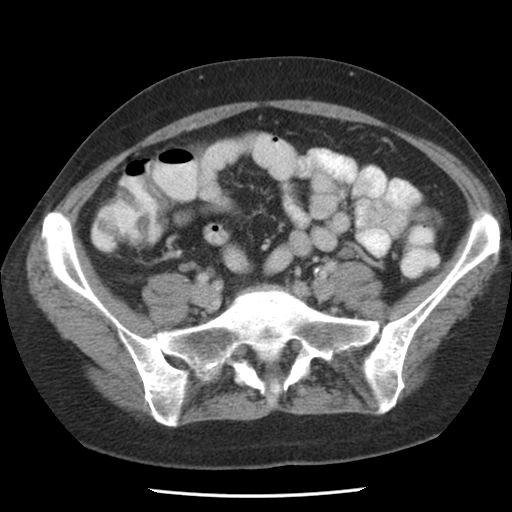
[im 37/74  soft-tissue]
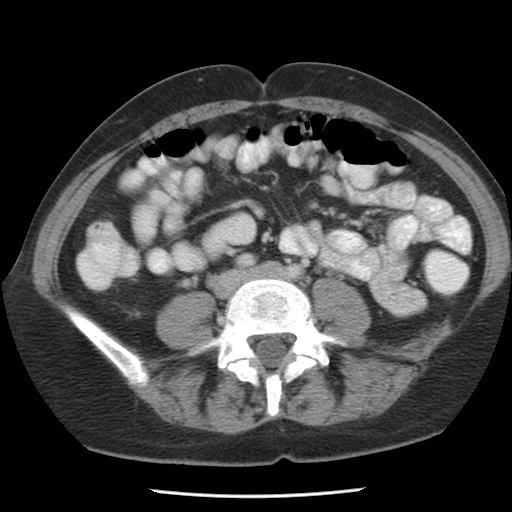
[im 44/74  soft-tissue]
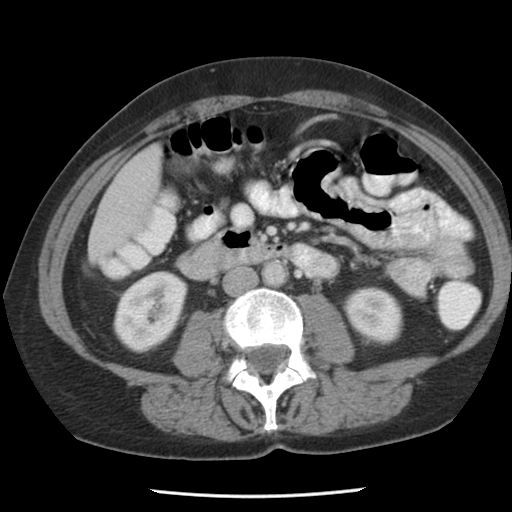
[im 47/74  soft-tissue]
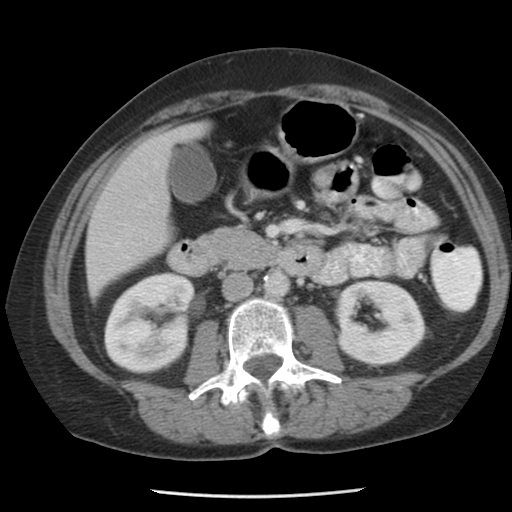
[im 47/74  bone]
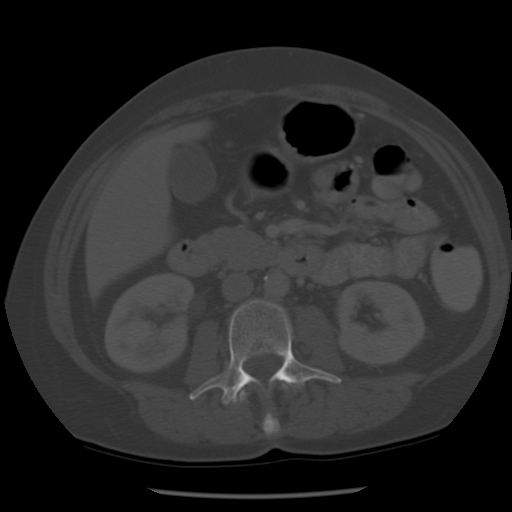
[im 54/74  soft-tissue]
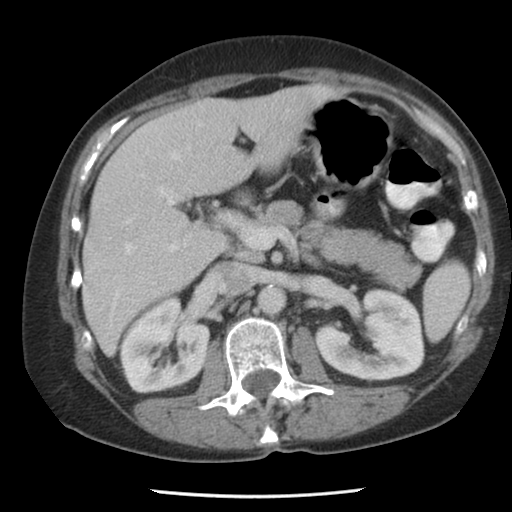
[im 57/74  soft-tissue]
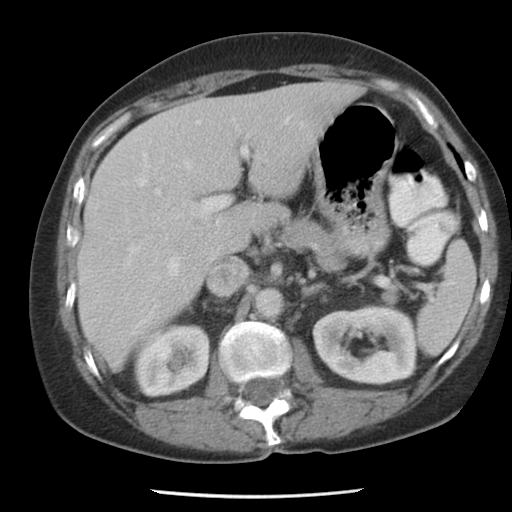
[im 60/74  lung]
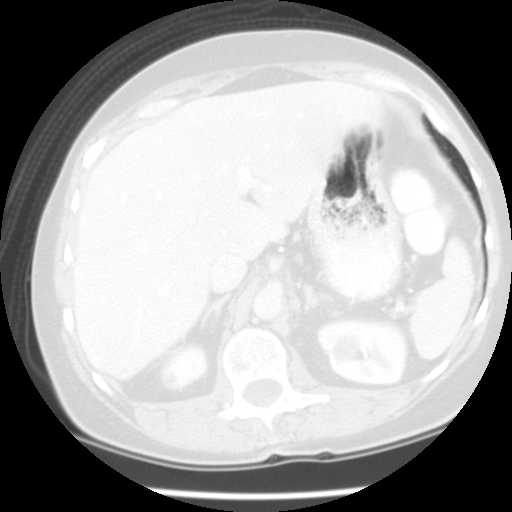
[im 64/74  soft-tissue]
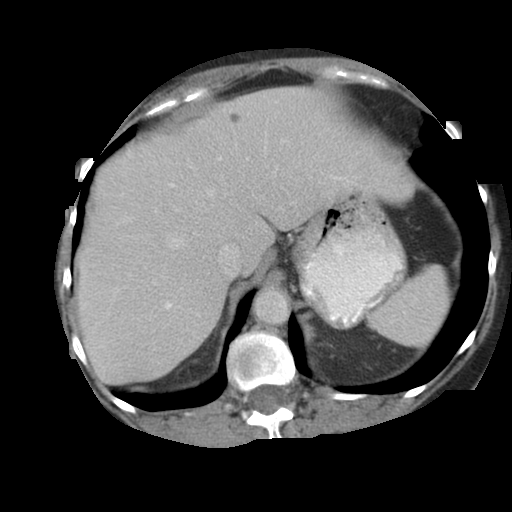
[im 64/74  lung]
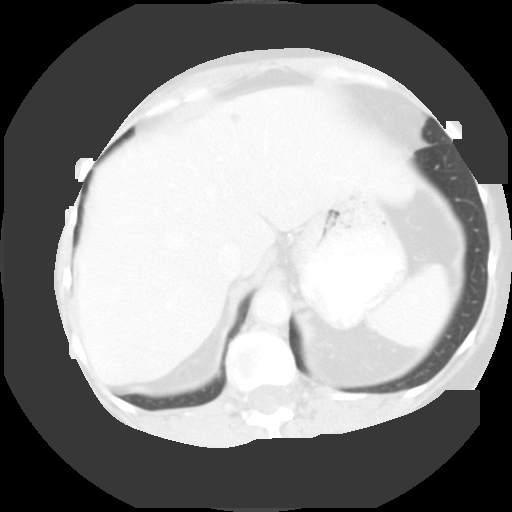
[im 67/74  lung]
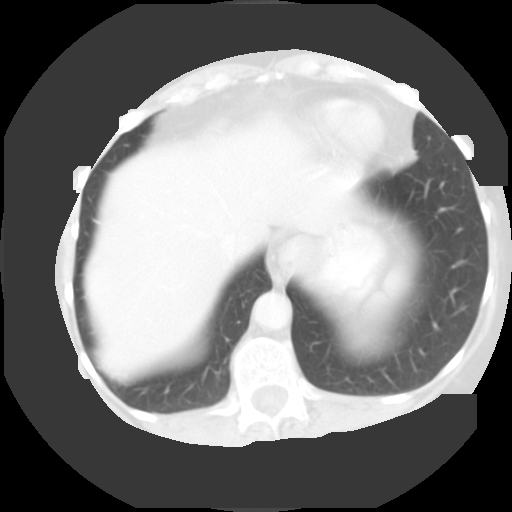
[im 70/74  soft-tissue]
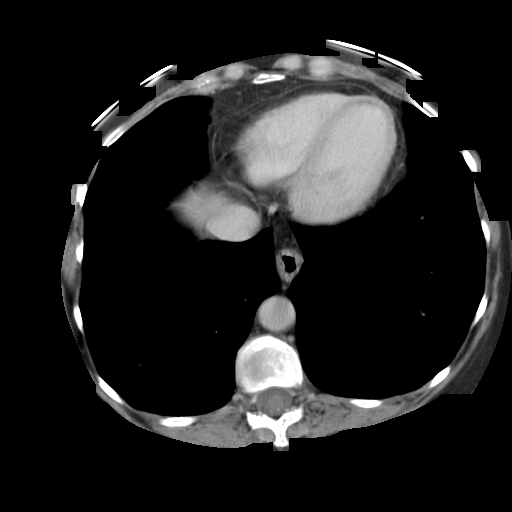
[im 70/74  lung]
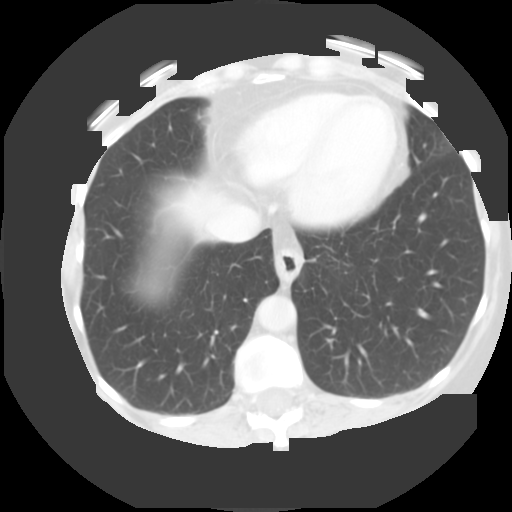

[15 of 32 positions shown; findings below may reference images not displayed]

IMPRESSION: Since 09/23/04: 
 [DATE].  Stable likely benign cystic changes of liver, pancreas, and bilateral kidneys. 
 2.  Otherwise negative. 
 CT PELVIS W/CONTRAST: 
 Following oral and IV contrast multidetector spiral axial images were obtained through the pelvis and comparison made with previous [REDACTED] pelvic CT 09/23/04.  
 Routine spiral CT of the pelvis was performed. Omnipaque intravenous contrast and oral contrast were administered. 

 The pelvic structures are normal in appearance. There is no evidence of masses, adenopathy, inflammatory process, or abnormal fluid collections.
IMPRESSION: Since [REDACTED] pelvic CT 09/23/04 no interval change ? negative.

## 2007-01-04 ENCOUNTER — Ambulatory Visit: Payer: Self-pay | Admitting: Pulmonary Disease

## 2007-01-27 ENCOUNTER — Ambulatory Visit: Payer: Self-pay | Admitting: Gastroenterology

## 2007-01-31 ENCOUNTER — Ambulatory Visit (HOSPITAL_COMMUNITY): Admission: RE | Admit: 2007-01-31 | Discharge: 2007-01-31 | Payer: Self-pay | Admitting: Gastroenterology

## 2007-03-09 ENCOUNTER — Ambulatory Visit: Payer: Self-pay | Admitting: Pulmonary Disease

## 2007-08-01 ENCOUNTER — Ambulatory Visit: Payer: Self-pay | Admitting: Pulmonary Disease

## 2007-08-01 LAB — CONVERTED CEMR LAB
BUN: 5 mg/dL — ABNORMAL LOW (ref 6–23)
Basophils Absolute: 0.1 10*3/uL (ref 0.0–0.1)
Basophils Relative: 0.6 % (ref 0.0–1.0)
CO2: 30 meq/L (ref 19–32)
Calcium: 9.5 mg/dL (ref 8.4–10.5)
Chloride: 103 meq/L (ref 96–112)
Creatinine, Ser: 0.5 mg/dL (ref 0.4–1.2)
Eosinophils Absolute: 0.1 10*3/uL (ref 0.0–0.6)
Eosinophils Relative: 0.8 % (ref 0.0–5.0)
GFR calc Af Amer: 165 mL/min
GFR calc non Af Amer: 137 mL/min
Glucose, Bld: 109 mg/dL — ABNORMAL HIGH (ref 70–99)
HCT: 46.1 % — ABNORMAL HIGH (ref 36.0–46.0)
Hemoglobin: 16 g/dL — ABNORMAL HIGH (ref 12.0–15.0)
Lymphocytes Relative: 42.5 % (ref 12.0–46.0)
MCHC: 34.8 g/dL (ref 30.0–36.0)
MCV: 101.6 fL — ABNORMAL HIGH (ref 78.0–100.0)
Monocytes Absolute: 0.6 10*3/uL (ref 0.2–0.7)
Monocytes Relative: 6.3 % (ref 3.0–11.0)
Neutro Abs: 5 10*3/uL (ref 1.4–7.7)
Neutrophils Relative %: 49.8 % (ref 43.0–77.0)
Platelets: 357 10*3/uL (ref 150–400)
Potassium: 3.6 meq/L (ref 3.5–5.1)
RBC: 4.54 M/uL (ref 3.87–5.11)
RDW: 12.2 % (ref 11.5–14.6)
Sodium: 142 meq/L (ref 135–145)
WBC: 10.1 10*3/uL (ref 4.5–10.5)

## 2007-08-02 ENCOUNTER — Ambulatory Visit: Payer: Self-pay | Admitting: Pulmonary Disease

## 2007-08-02 ENCOUNTER — Ambulatory Visit (HOSPITAL_COMMUNITY): Admission: RE | Admit: 2007-08-02 | Discharge: 2007-08-02 | Payer: Self-pay | Admitting: Pulmonary Disease

## 2007-08-02 ENCOUNTER — Observation Stay (HOSPITAL_COMMUNITY): Admission: AD | Admit: 2007-08-02 | Discharge: 2007-08-04 | Payer: Self-pay | Admitting: Pulmonary Disease

## 2007-08-11 ENCOUNTER — Ambulatory Visit: Payer: Self-pay | Admitting: Pulmonary Disease

## 2007-08-16 ENCOUNTER — Encounter: Admission: RE | Admit: 2007-08-16 | Discharge: 2007-09-08 | Payer: Self-pay | Admitting: Pulmonary Disease

## 2007-10-07 DIAGNOSIS — R42 Dizziness and giddiness: Secondary | ICD-10-CM | POA: Insufficient documentation

## 2007-10-07 DIAGNOSIS — N200 Calculus of kidney: Secondary | ICD-10-CM | POA: Insufficient documentation

## 2007-10-07 DIAGNOSIS — R519 Headache, unspecified: Secondary | ICD-10-CM | POA: Insufficient documentation

## 2007-10-07 DIAGNOSIS — R51 Headache: Secondary | ICD-10-CM | POA: Insufficient documentation

## 2007-11-11 ENCOUNTER — Ambulatory Visit: Payer: Self-pay | Admitting: Pulmonary Disease

## 2007-11-11 LAB — CONVERTED CEMR LAB
Bilirubin Urine: NEGATIVE
Crystals: NEGATIVE
Ketones, ur: NEGATIVE mg/dL
Mucus, UA: NEGATIVE
Nitrite: NEGATIVE
Specific Gravity, Urine: 1.01 (ref 1.000–1.03)
Urine Glucose: NEGATIVE mg/dL
Urobilinogen, UA: 0.2 (ref 0.0–1.0)
pH: 8 (ref 5.0–8.0)

## 2007-11-17 ENCOUNTER — Ambulatory Visit: Payer: Self-pay | Admitting: Pulmonary Disease

## 2007-12-06 ENCOUNTER — Ambulatory Visit: Payer: Self-pay | Admitting: Pulmonary Disease

## 2007-12-06 DIAGNOSIS — K2100 Gastro-esophageal reflux disease with esophagitis, without bleeding: Secondary | ICD-10-CM | POA: Insufficient documentation

## 2007-12-06 DIAGNOSIS — J42 Unspecified chronic bronchitis: Secondary | ICD-10-CM | POA: Insufficient documentation

## 2007-12-06 DIAGNOSIS — M949 Disorder of cartilage, unspecified: Secondary | ICD-10-CM

## 2007-12-06 DIAGNOSIS — Z86010 Personal history of colonic polyps: Secondary | ICD-10-CM | POA: Insufficient documentation

## 2007-12-06 DIAGNOSIS — K5730 Diverticulosis of large intestine without perforation or abscess without bleeding: Secondary | ICD-10-CM | POA: Insufficient documentation

## 2007-12-06 DIAGNOSIS — M4187 Other forms of scoliosis, lumbosacral region: Secondary | ICD-10-CM | POA: Insufficient documentation

## 2007-12-06 DIAGNOSIS — M899 Disorder of bone, unspecified: Secondary | ICD-10-CM | POA: Insufficient documentation

## 2007-12-20 ENCOUNTER — Encounter: Admission: RE | Admit: 2007-12-20 | Discharge: 2007-12-20 | Payer: Self-pay | Admitting: Orthopedic Surgery

## 2008-01-04 ENCOUNTER — Encounter: Admission: RE | Admit: 2008-01-04 | Discharge: 2008-01-04 | Payer: Self-pay | Admitting: Orthopedic Surgery

## 2008-01-12 ENCOUNTER — Encounter: Payer: Self-pay | Admitting: Pulmonary Disease

## 2008-02-06 ENCOUNTER — Encounter: Admission: RE | Admit: 2008-02-06 | Discharge: 2008-02-06 | Payer: Self-pay | Admitting: Orthopedic Surgery

## 2008-03-06 ENCOUNTER — Ambulatory Visit: Payer: Self-pay | Admitting: Pulmonary Disease

## 2008-03-06 DIAGNOSIS — F17200 Nicotine dependence, unspecified, uncomplicated: Secondary | ICD-10-CM | POA: Insufficient documentation

## 2008-03-12 LAB — CONVERTED CEMR LAB
ALT: 15 units/L (ref 0–35)
AST: 23 units/L (ref 0–37)
Albumin: 3.9 g/dL (ref 3.5–5.2)
Alkaline Phosphatase: 56 units/L (ref 39–117)
BUN: 7 mg/dL (ref 6–23)
Bacteria, UA: NEGATIVE
Basophils Absolute: 0.1 10*3/uL (ref 0.0–0.1)
Basophils Relative: 0.8 % (ref 0.0–1.0)
Bilirubin Urine: NEGATIVE
Bilirubin, Direct: 0.1 mg/dL (ref 0.0–0.3)
CO2: 29 meq/L (ref 19–32)
Calcium: 9.5 mg/dL (ref 8.4–10.5)
Chloride: 108 meq/L (ref 96–112)
Cholesterol: 250 mg/dL (ref 0–200)
Creatinine, Ser: 0.7 mg/dL (ref 0.4–1.2)
Crystals: NEGATIVE
Direct LDL: 157.5 mg/dL
Eosinophils Absolute: 0.1 10*3/uL (ref 0.0–0.6)
Eosinophils Relative: 1.2 % (ref 0.0–5.0)
GFR calc Af Amer: 112 mL/min
GFR calc non Af Amer: 93 mL/min
Glucose, Bld: 93 mg/dL (ref 70–99)
HCT: 39.7 % (ref 36.0–46.0)
HDL: 72.6 mg/dL (ref >39.0)
Hemoglobin: 13.3 g/dL (ref 12.0–15.0)
Ketones, ur: NEGATIVE mg/dL
Leukocytes, UA: NEGATIVE
Lymphocytes Relative: 34.8 % (ref 12.0–46.0)
MCHC: 33.6 g/dL (ref 30.0–36.0)
MCV: 101.9 fL — ABNORMAL HIGH (ref 78.0–100.0)
Monocytes Absolute: 0.7 10*3/uL (ref 0.2–0.7)
Monocytes Relative: 7.5 % (ref 3.0–11.0)
Mucus, UA: NEGATIVE
Neutro Abs: 5.2 10*3/uL (ref 1.4–7.7)
Neutrophils Relative %: 55.7 % (ref 43.0–77.0)
Nitrite: NEGATIVE
Platelets: 385 10*3/uL (ref 150–400)
Potassium: 4.4 meq/L (ref 3.5–5.1)
RBC / HPF: NONE SEEN
RBC: 3.9 M/uL (ref 3.87–5.11)
RDW: 13.4 % (ref 11.5–14.6)
Sodium: 142 meq/L (ref 135–145)
Specific Gravity, Urine: <=1.005 (ref 1.000–1.03)
TSH: 0.63 microintl units/mL (ref 0.35–5.50)
Total Bilirubin: 0.8 mg/dL (ref 0.3–1.2)
Total CHOL/HDL Ratio: 3.4
Total Protein, Urine: NEGATIVE mg/dL
Total Protein: 6.7 g/dL (ref 6.0–8.3)
Triglycerides: 119 mg/dL (ref 0–149)
Urine Glucose: NEGATIVE mg/dL
Urobilinogen, UA: 0.2 (ref 0.0–1.0)
VLDL: 24 mg/dL (ref 0–40)
Vit D, 1,25-Dihydroxy: 12 — ABNORMAL LOW (ref 30–89)
WBC, UA: NONE SEEN cells/hpf
WBC: 9.3 10*3/uL (ref 4.5–10.5)
pH: 5.5 (ref 5.0–8.0)

## 2008-03-16 ENCOUNTER — Encounter
Admission: RE | Admit: 2008-03-16 | Discharge: 2008-06-14 | Payer: Self-pay | Admitting: Physical Medicine & Rehabilitation

## 2008-03-16 ENCOUNTER — Ambulatory Visit: Payer: Self-pay | Admitting: Physical Medicine & Rehabilitation

## 2008-04-02 ENCOUNTER — Ambulatory Visit: Payer: Self-pay | Admitting: Physical Medicine & Rehabilitation

## 2008-04-12 ENCOUNTER — Ambulatory Visit: Payer: Self-pay | Admitting: Pulmonary Disease

## 2008-04-13 LAB — CONVERTED CEMR LAB
BUN: 6 mg/dL (ref 6–23)
CO2: 30 meq/L (ref 19–32)
Calcium: 8.7 mg/dL (ref 8.4–10.5)
Chloride: 103 meq/L (ref 96–112)
Creatinine, Ser: 0.6 mg/dL (ref 0.4–1.2)
GFR calc Af Amer: 134 mL/min
GFR calc non Af Amer: 111 mL/min
Glucose, Bld: 94 mg/dL (ref 70–99)
Potassium: 3.7 meq/L (ref 3.5–5.1)
Pro B Natriuretic peptide (BNP): 30 pg/mL (ref 0.0–100.0)
Sodium: 140 meq/L (ref 135–145)

## 2008-04-26 ENCOUNTER — Ambulatory Visit: Payer: Self-pay | Admitting: Internal Medicine

## 2008-04-27 ENCOUNTER — Ambulatory Visit: Payer: Self-pay

## 2008-04-27 ENCOUNTER — Encounter: Payer: Self-pay | Admitting: Pulmonary Disease

## 2008-04-30 ENCOUNTER — Ambulatory Visit: Payer: Self-pay | Admitting: Physical Medicine & Rehabilitation

## 2008-05-07 ENCOUNTER — Ambulatory Visit: Payer: Self-pay | Admitting: Physical Medicine & Rehabilitation

## 2008-05-15 ENCOUNTER — Telehealth (INDEPENDENT_AMBULATORY_CARE_PROVIDER_SITE_OTHER): Payer: Self-pay | Admitting: *Deleted

## 2008-05-15 ENCOUNTER — Ambulatory Visit: Payer: Self-pay | Admitting: Internal Medicine

## 2008-05-15 LAB — CONVERTED CEMR LAB
Crystals: NEGATIVE
Hemoglobin, Urine: NEGATIVE
Ketones, ur: 15 mg/dL — AB
Nitrite: NEGATIVE
Specific Gravity, Urine: 1.025 (ref 1.000–1.03)
Urine Glucose: NEGATIVE mg/dL
Urobilinogen, UA: 1 (ref 0.0–1.0)
pH: 5 (ref 5.0–8.0)

## 2008-05-16 ENCOUNTER — Encounter: Payer: Self-pay | Admitting: Adult Health

## 2008-05-17 ENCOUNTER — Telehealth (INDEPENDENT_AMBULATORY_CARE_PROVIDER_SITE_OTHER): Payer: Self-pay | Admitting: *Deleted

## 2008-05-18 ENCOUNTER — Telehealth: Payer: Self-pay | Admitting: Adult Health

## 2008-05-24 ENCOUNTER — Emergency Department (HOSPITAL_COMMUNITY): Admission: EM | Admit: 2008-05-24 | Discharge: 2008-05-25 | Payer: Self-pay | Admitting: Emergency Medicine

## 2008-05-24 ENCOUNTER — Ambulatory Visit: Payer: Self-pay | Admitting: Internal Medicine

## 2008-05-25 ENCOUNTER — Encounter: Payer: Self-pay | Admitting: Pulmonary Disease

## 2008-05-25 ENCOUNTER — Telehealth (INDEPENDENT_AMBULATORY_CARE_PROVIDER_SITE_OTHER): Payer: Self-pay | Admitting: *Deleted

## 2008-05-26 ENCOUNTER — Ambulatory Visit (HOSPITAL_COMMUNITY): Admission: RE | Admit: 2008-05-26 | Discharge: 2008-05-26 | Payer: Self-pay | Admitting: Orthopedic Surgery

## 2008-05-28 LAB — CONVERTED CEMR LAB
ALT: 14 units/L (ref 0–35)
AST: 21 units/L (ref 0–37)
Albumin: 3.5 g/dL (ref 3.5–5.2)
Alkaline Phosphatase: 60 units/L (ref 39–117)
BUN: 9 mg/dL (ref 6–23)
Bilirubin, Direct: 0.1 mg/dL (ref 0.0–0.3)
CO2: 31 meq/L (ref 19–32)
Calcium: 9 mg/dL (ref 8.4–10.5)
Chloride: 100 meq/L (ref 96–112)
Cholesterol: 205 mg/dL (ref 0–200)
Creatinine, Ser: 0.6 mg/dL (ref 0.4–1.2)
Direct LDL: 102.4 mg/dL
GFR calc Af Amer: 133 mL/min
GFR calc non Af Amer: 110 mL/min
Glucose, Bld: 95 mg/dL (ref 70–99)
HDL: 61.6 mg/dL (ref >39.0)
Potassium: 3.3 meq/L — ABNORMAL LOW (ref 3.5–5.1)
Sodium: 141 meq/L (ref 135–145)
Total Bilirubin: 0.8 mg/dL (ref 0.3–1.2)
Total CHOL/HDL Ratio: 3.3
Total Protein: 6.6 g/dL (ref 6.0–8.3)
Triglycerides: 141 mg/dL (ref 0–149)
VLDL: 28 mg/dL (ref 0–40)

## 2008-06-11 ENCOUNTER — Encounter: Admission: RE | Admit: 2008-06-11 | Discharge: 2008-06-11 | Payer: Self-pay | Admitting: Orthopaedic Surgery

## 2008-06-19 ENCOUNTER — Encounter
Admission: RE | Admit: 2008-06-19 | Discharge: 2008-06-21 | Payer: Self-pay | Admitting: Physical Medicine & Rehabilitation

## 2008-06-21 ENCOUNTER — Ambulatory Visit: Payer: Self-pay | Admitting: Physical Medicine & Rehabilitation

## 2008-06-24 IMAGING — CR DG ABDOMEN ACUTE W/ 1V CHEST
3 series · 3 of 3 positions shown · non-contrast
Comparison: 02/10/05 and 09/24/04.

CLINICAL DATA: Left abdominal pain, nausea, and vomiting.  
ACUTE ABDOMINAL SERIES WITH CHEST ? 3 VIEW:

[w chest pa]
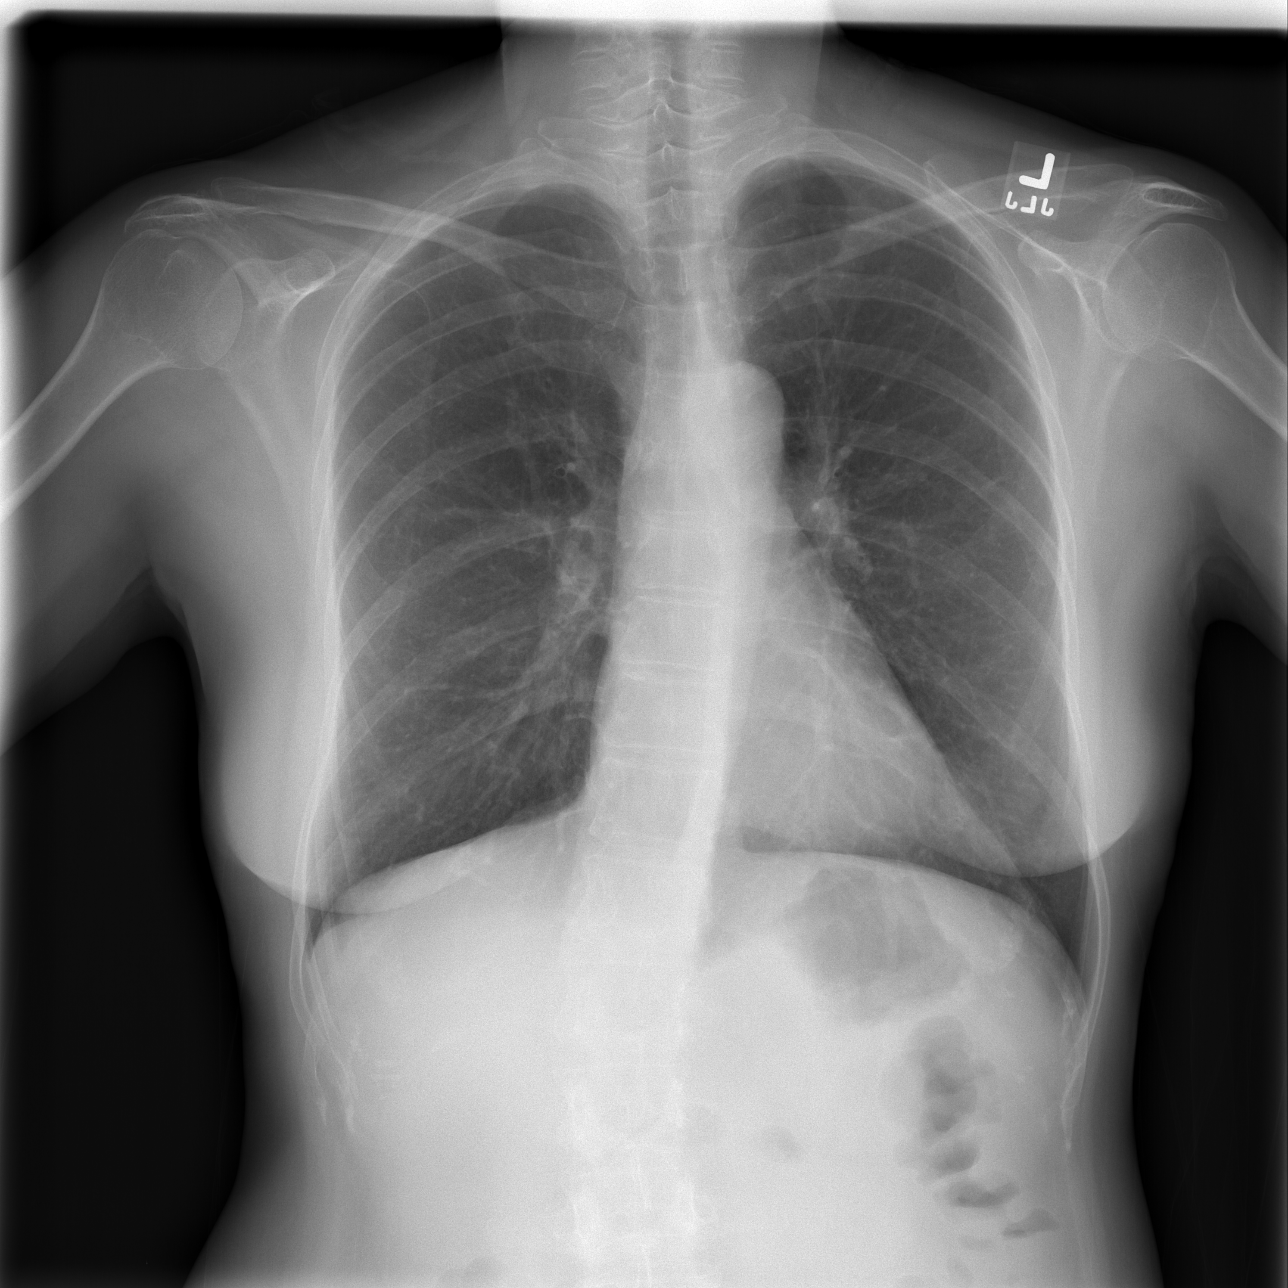

[w abdomen upright]
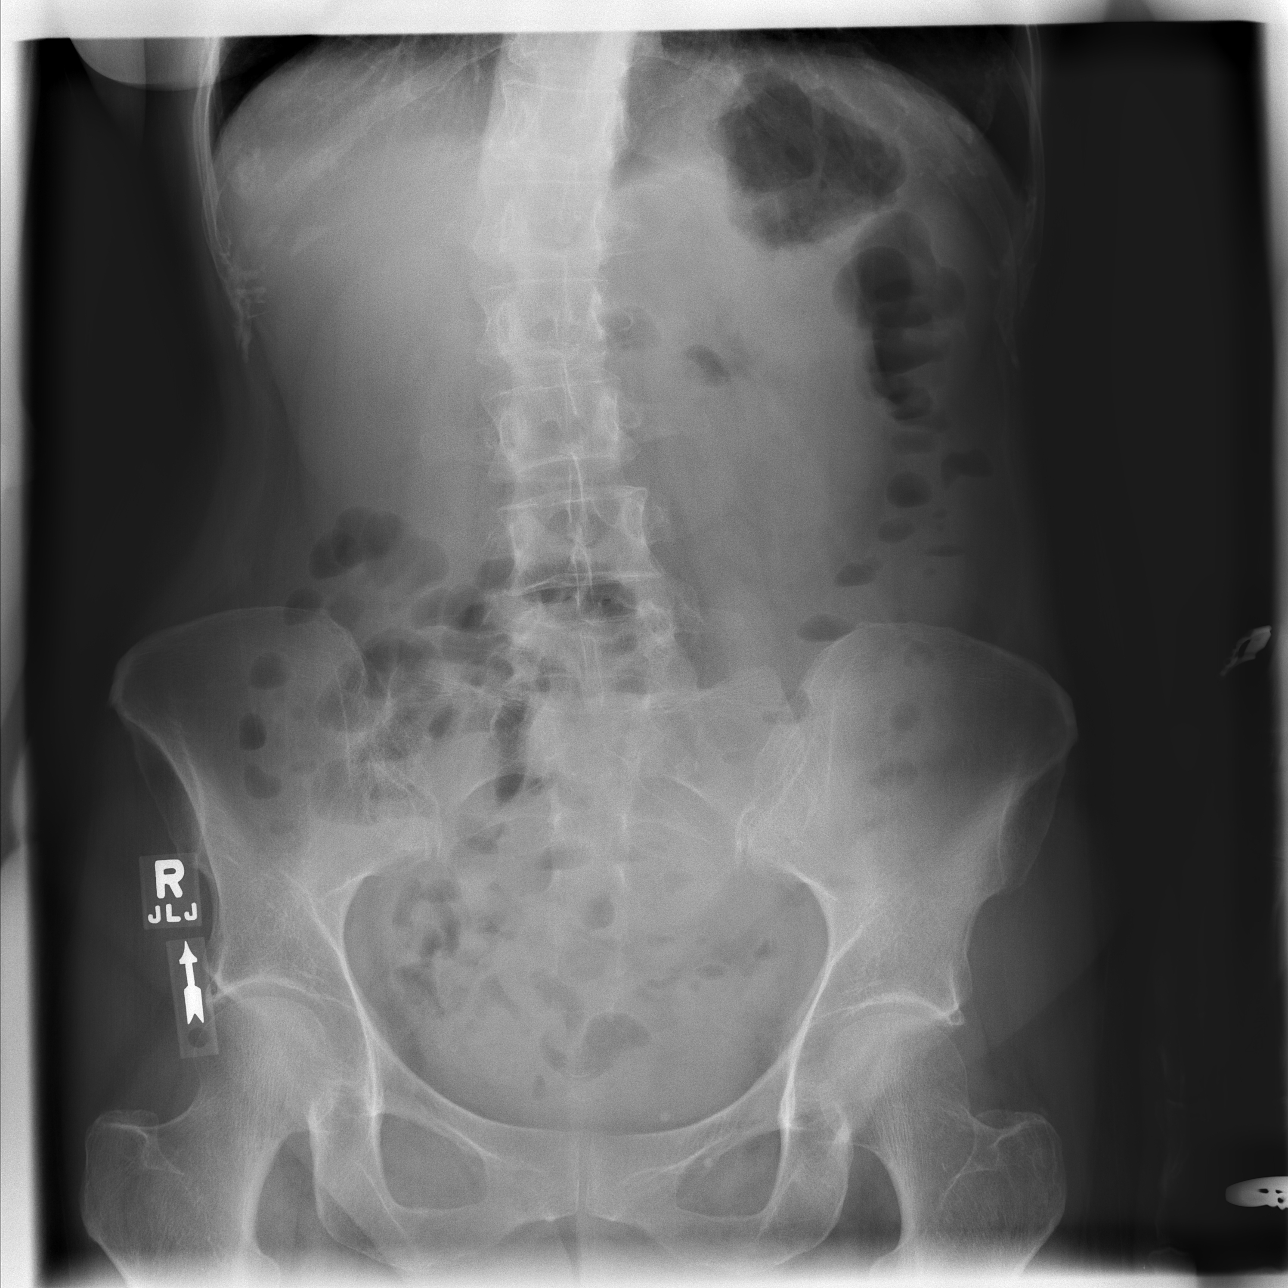

[t abdomen supine]
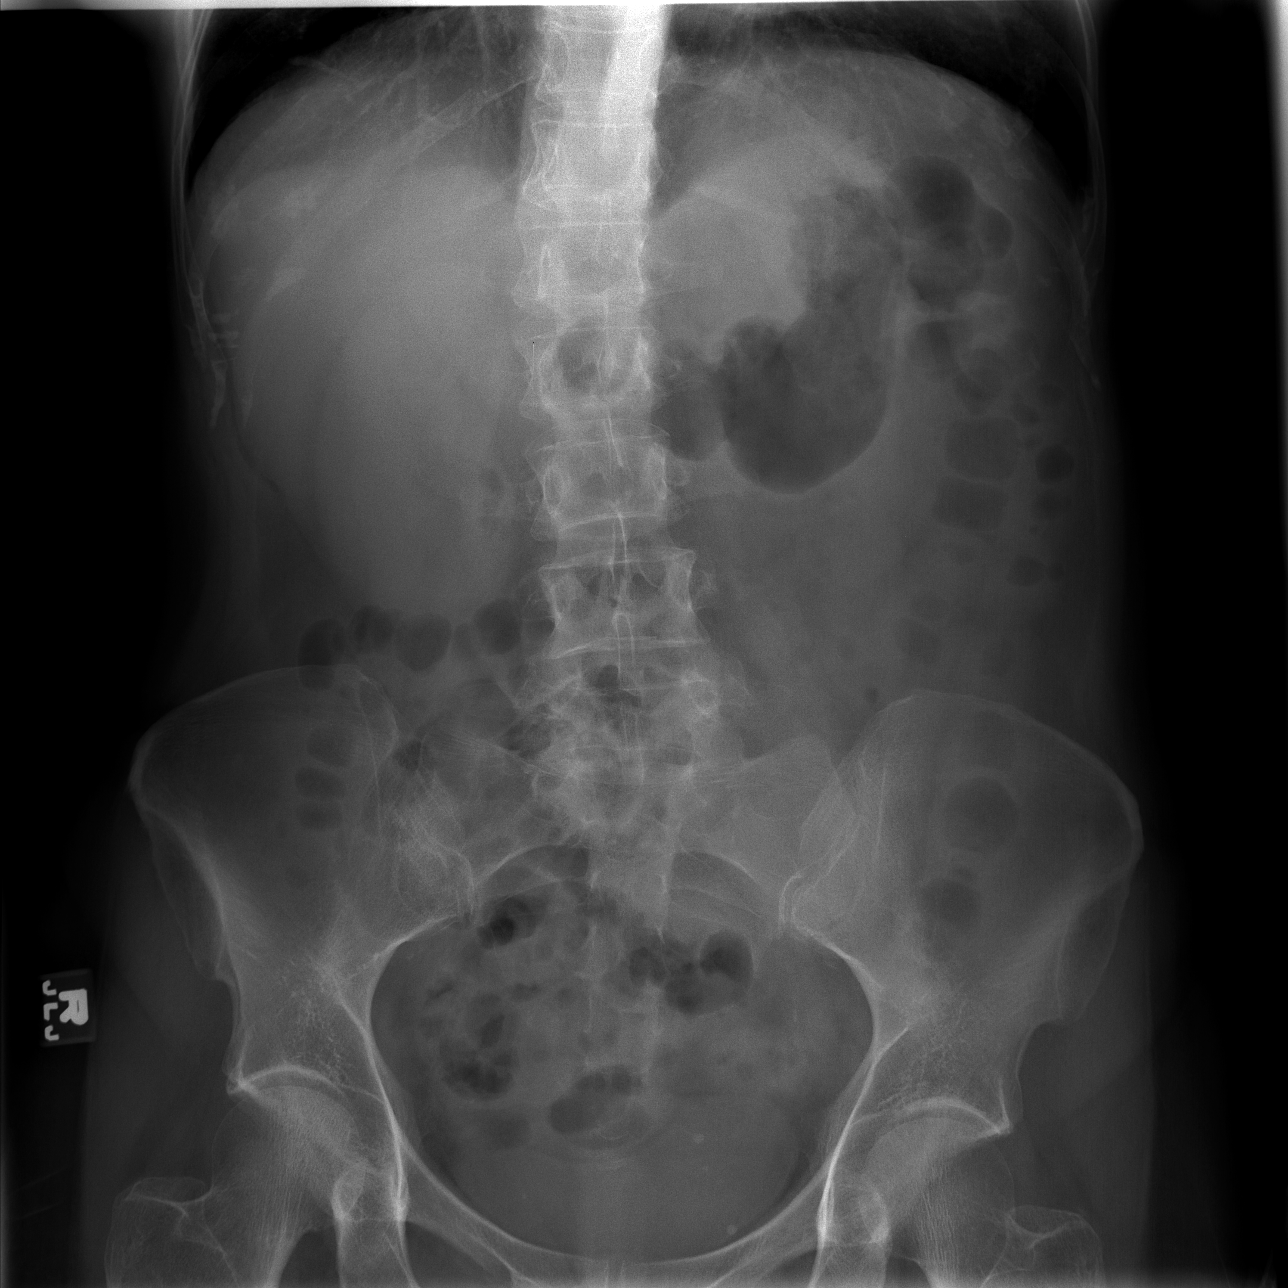

[3 of 3 positions shown; findings below may reference images not displayed]

FINDINGS: Chest: Mild scoliosis of the thoracolumbar spine.  Normal heart size and vascularity.  No acute pneumonia, edema, effusion, or pneumothorax.  Nipple shadows project over the lower chest bilaterally.  No free air.  
Abdomen: Scattered air is evident throughout the small and large bowel without dilatation.  Diffuse air fluid levels are present in the small bowel and colon.  The appearance is nonspecific.  No obstruction or definite ileus.
IMPRESSION: 1.  No acute chest disease. 
2.  No obstruction or free air. 
3.  Multiple small bowel and colon air fluid levels without dilatation, nonspecific, but can be seen with gastroenteritis.

## 2008-06-24 IMAGING — CT CT ABDOMEN W/ CM
1 of 3 series · 14 of 32 positions shown, 19 images · IV contrast (omnipaque)
Comparison: 02/10/05.

CLINICAL DATA: Left-sided abdominal pain, nausea and vomiting.  
 ABDOMEN CT WITH CONTRAST:
TECHNIQUE: Multidetector CT imaging of the abdomen was performed following the standard protocol during bolus administration of intravenous contrast.
 Contrast:  125 cc Omnipaque 300 and oral contrast.
TECHNIQUE: Multidetector CT imaging of the pelvis was performed following the standard protocol during bolus administration of intravenous contrast.

[Series 2: abd_pel 5.0 b40f st · axial · 0.59mm/px · z∈[-412,-42]mm · 14 of 84 slices shown, 19 images]
[im 5/84  soft-tissue]
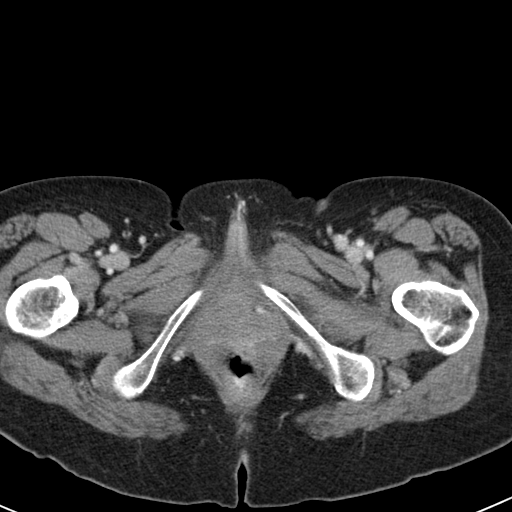
[im 5/84  bone]
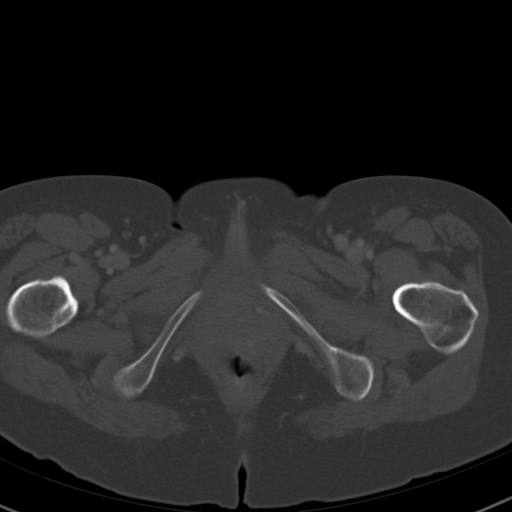
[im 14/84  soft-tissue]
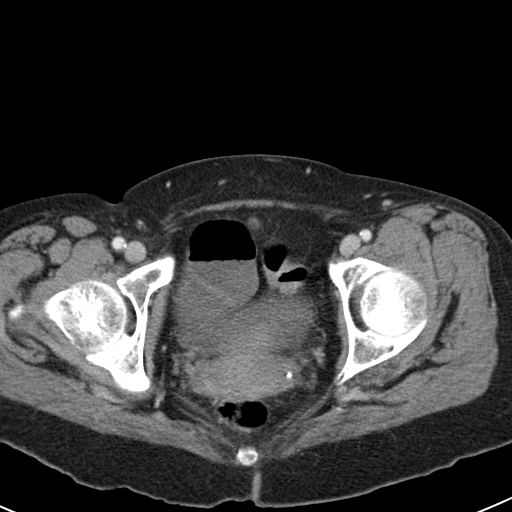
[im 18/84  soft-tissue]
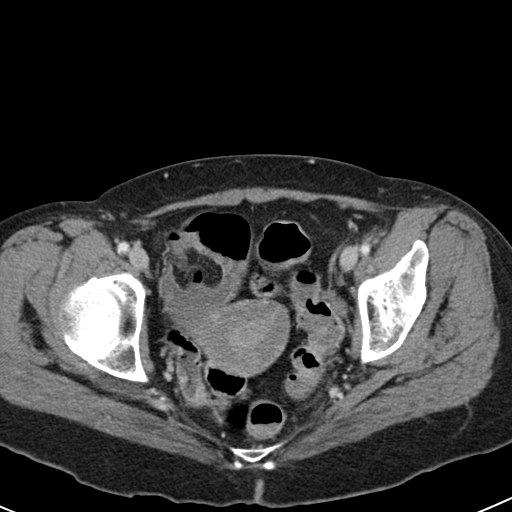
[im 22/84  soft-tissue]
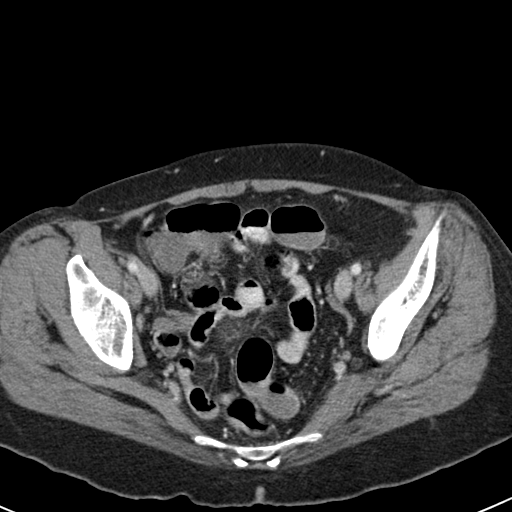
[im 31/84  soft-tissue]
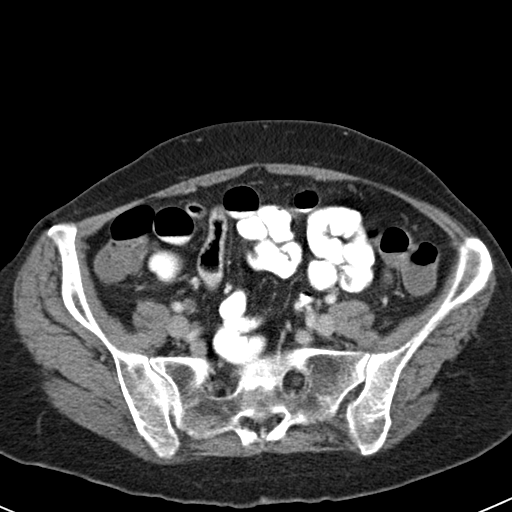
[im 35/84  soft-tissue]
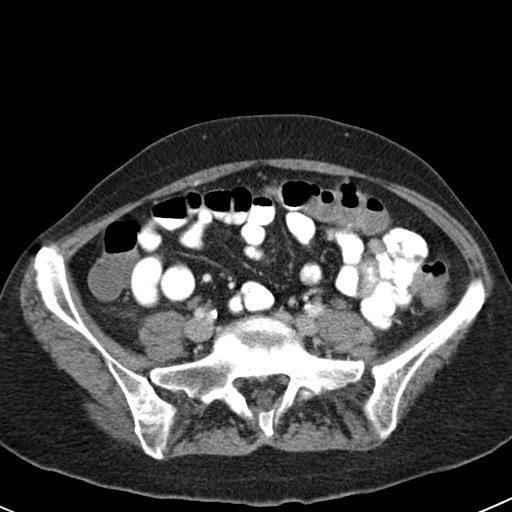
[im 44/84  soft-tissue]
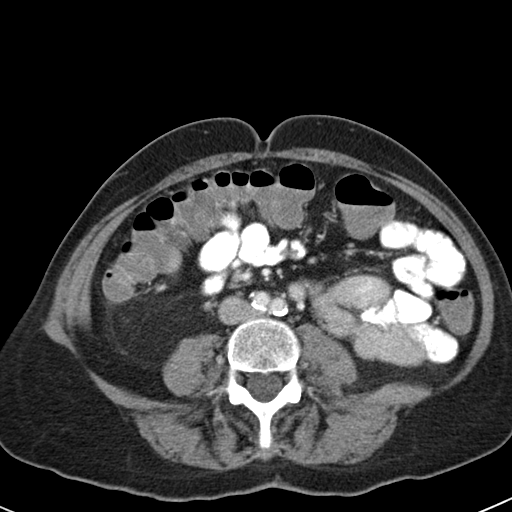
[im 49/84  soft-tissue]
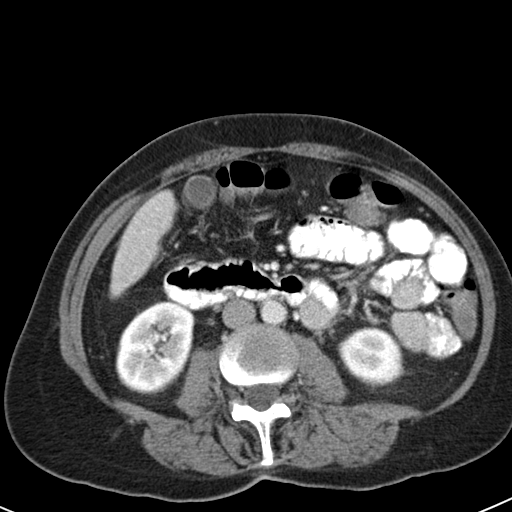
[im 53/84  soft-tissue]
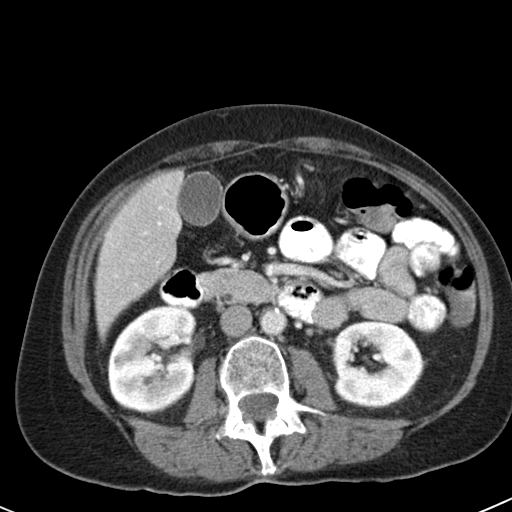
[im 53/84  bone]
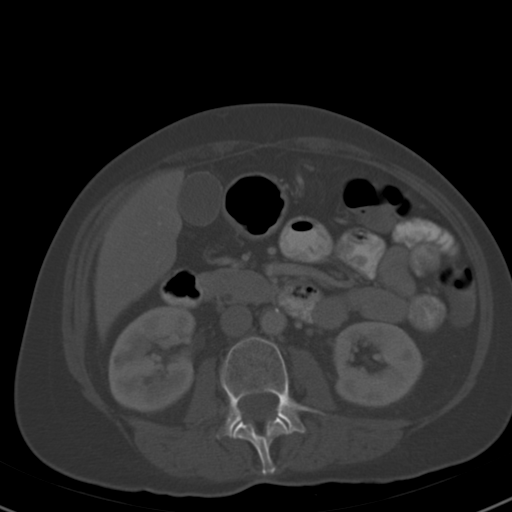
[im 62/84  soft-tissue]
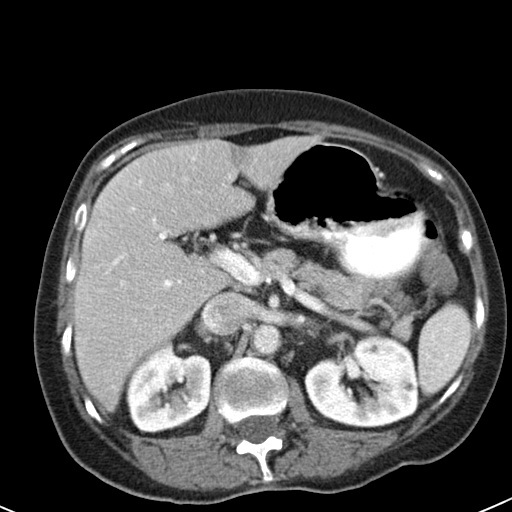
[im 66/84  soft-tissue]
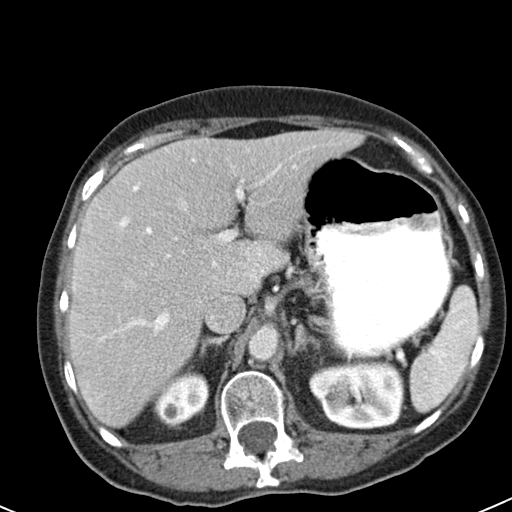
[im 66/84  lung]
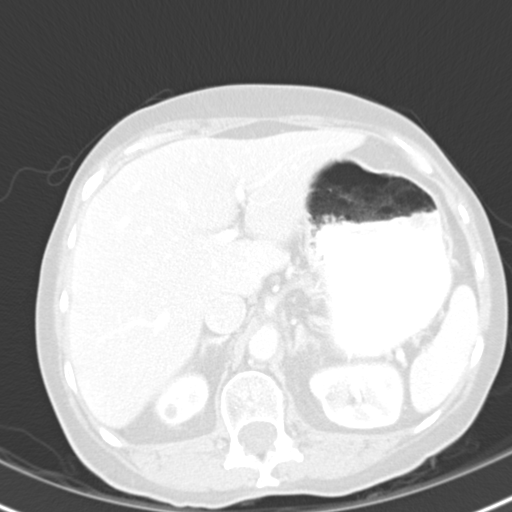
[im 70/84  soft-tissue]
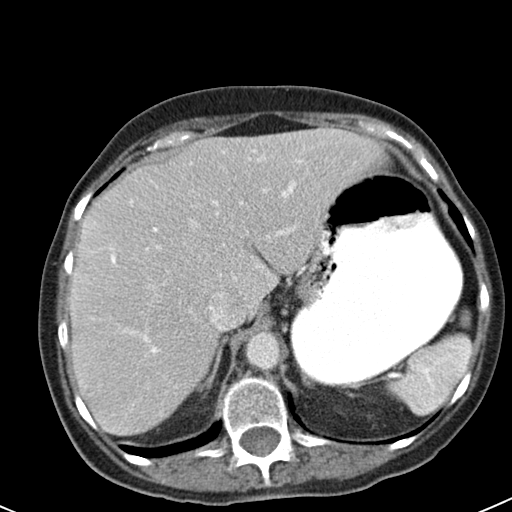
[im 70/84  lung]
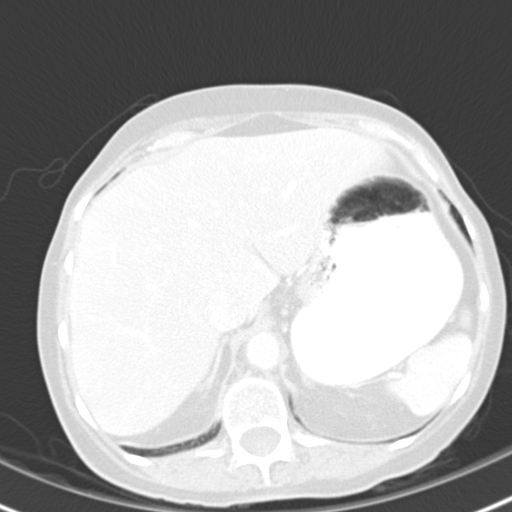
[im 75/84  lung]
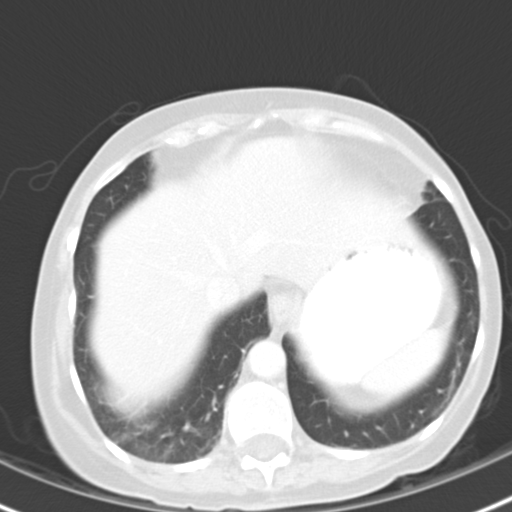
[im 79/84  soft-tissue]
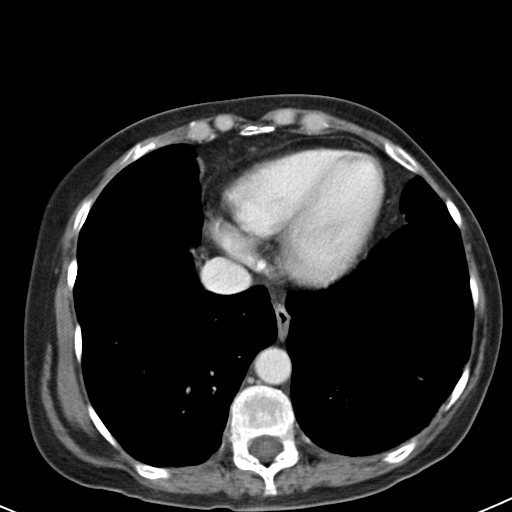
[im 79/84  lung]
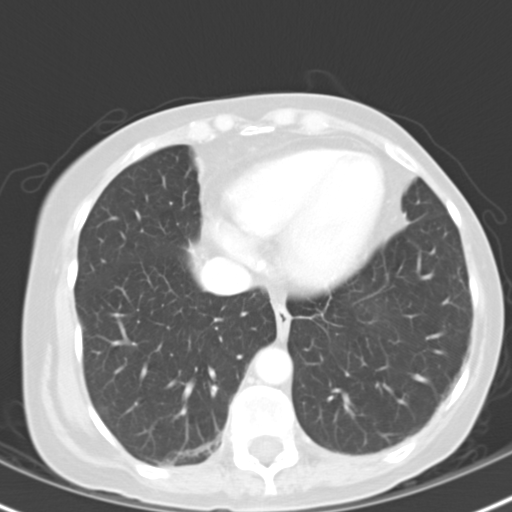

[14 of 32 positions shown; findings below may reference images not displayed]

FINDINGS: Tiny less than 5 mm cyst in the left hepatic lobe remains stable.  Tiny less than 1 cm low density in the pancreatic body is also unchanged.  Tiny renal cysts are also stable.  There is no evidence of mass or lymphadenopathy within the abdomen.  There is no evidence of inflammatory process or abnormal fluid collections.  Gallbladder is unremarkable as well as the abdominal bowel loops.
IMPRESSION: Stable abdomen CT.  No acute findings.
 PELVIS CT WITH CONTRAST:
FINDINGS: There is no evidence of pelvic mass or adenopathy.  There is no evidence of inflammatory process or abnormal fluid collections. The pelvic bowel loops and other soft tissue structures are unremarkable.  No hernia identified.
IMPRESSION: Negative pelvis CT.  No significant change compared with prior study.

## 2008-07-31 ENCOUNTER — Ambulatory Visit: Payer: Self-pay | Admitting: Internal Medicine

## 2008-07-31 ENCOUNTER — Ambulatory Visit: Payer: Self-pay | Admitting: Pulmonary Disease

## 2008-08-01 LAB — CONVERTED CEMR LAB
BUN: 2 mg/dL — ABNORMAL LOW (ref 6–23)
Basophils Absolute: 0 10*3/uL (ref 0.0–0.1)
Basophils Relative: 0 % (ref 0.0–3.0)
CO2: 29 meq/L (ref 19–32)
Calcium: 8.7 mg/dL (ref 8.4–10.5)
Chloride: 104 meq/L (ref 96–112)
Creatinine, Ser: 0.6 mg/dL (ref 0.4–1.2)
Eosinophils Absolute: 0.1 10*3/uL (ref 0.0–0.7)
Eosinophils Relative: 0.6 % (ref 0.0–5.0)
GFR calc Af Amer: 133 mL/min
GFR calc non Af Amer: 110 mL/min
Glucose, Bld: 94 mg/dL (ref 70–99)
HCT: 42.3 % (ref 36.0–46.0)
Hemoglobin: 15 g/dL (ref 12.0–15.0)
Lymphocytes Relative: 30.3 % (ref 12.0–46.0)
MCHC: 35.3 g/dL (ref 30.0–36.0)
MCV: 100.5 fL — ABNORMAL HIGH (ref 78.0–100.0)
Monocytes Absolute: 0.6 10*3/uL (ref 0.1–1.0)
Monocytes Relative: 5.8 % (ref 3.0–12.0)
Neutro Abs: 6.8 10*3/uL (ref 1.4–7.7)
Neutrophils Relative %: 63.3 % (ref 43.0–77.0)
Platelets: 365 10*3/uL (ref 150–400)
Potassium: 4.1 meq/L (ref 3.5–5.1)
RBC: 4.21 M/uL (ref 3.87–5.11)
RDW: 12.5 % (ref 11.5–14.6)
Sodium: 140 meq/L (ref 135–145)
WBC: 10.7 10*3/uL — ABNORMAL HIGH (ref 4.5–10.5)

## 2008-08-07 ENCOUNTER — Telehealth (INDEPENDENT_AMBULATORY_CARE_PROVIDER_SITE_OTHER): Payer: Self-pay | Admitting: *Deleted

## 2008-08-10 ENCOUNTER — Ambulatory Visit: Payer: Self-pay | Admitting: Pulmonary Disease

## 2008-08-10 DIAGNOSIS — F329 Major depressive disorder, single episode, unspecified: Secondary | ICD-10-CM | POA: Insufficient documentation

## 2008-08-10 DIAGNOSIS — F32A Depression, unspecified: Secondary | ICD-10-CM | POA: Insufficient documentation

## 2008-08-10 DIAGNOSIS — K589 Irritable bowel syndrome without diarrhea: Secondary | ICD-10-CM | POA: Insufficient documentation

## 2008-08-10 DIAGNOSIS — I872 Venous insufficiency (chronic) (peripheral): Secondary | ICD-10-CM | POA: Insufficient documentation

## 2008-08-10 DIAGNOSIS — M549 Dorsalgia, unspecified: Secondary | ICD-10-CM | POA: Insufficient documentation

## 2008-08-10 DIAGNOSIS — F341 Dysthymic disorder: Secondary | ICD-10-CM | POA: Insufficient documentation

## 2008-09-27 HISTORY — PX: BACK SURGERY: SHX140

## 2008-10-21 IMAGING — RF DG SMALL BOWEL
15 of 24 series · 15 of 24 positions shown · non-contrast
Comparison: none

CLINICAL DATA: Diarrhea.  Weight loss.
SMALL BOWEL FOLLOWTHROUGH:

[Series 1: run · 1 of 1 slices shown (1 of 15)]
[im 1/1]
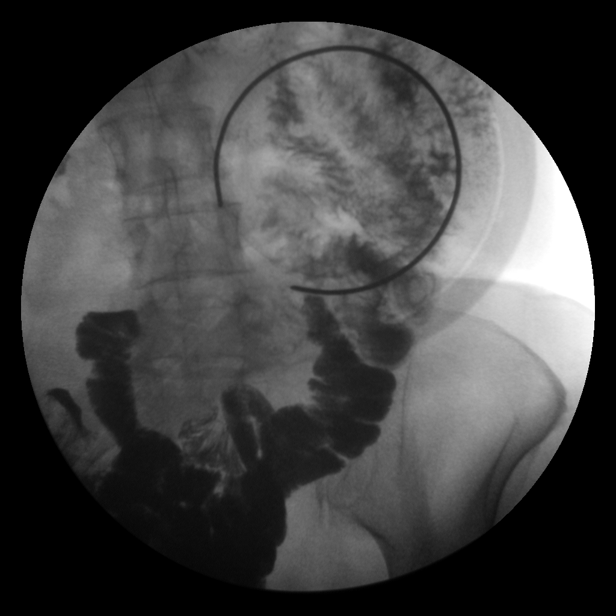

[Series 3: run · 1 of 1 slices shown (2 of 15)]
[im 1/1]
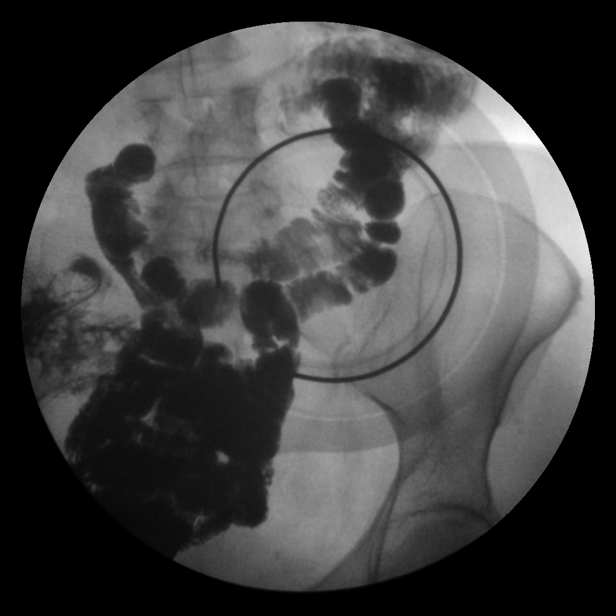

[Series 5: run · 1 of 1 slices shown (3 of 15)]
[im 1/1]
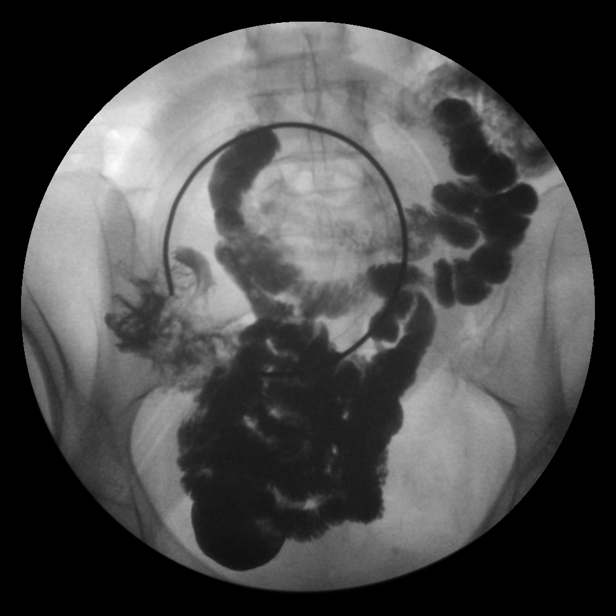

[Series 6: run · 1 of 1 slices shown (4 of 15)]
[im 1/1]
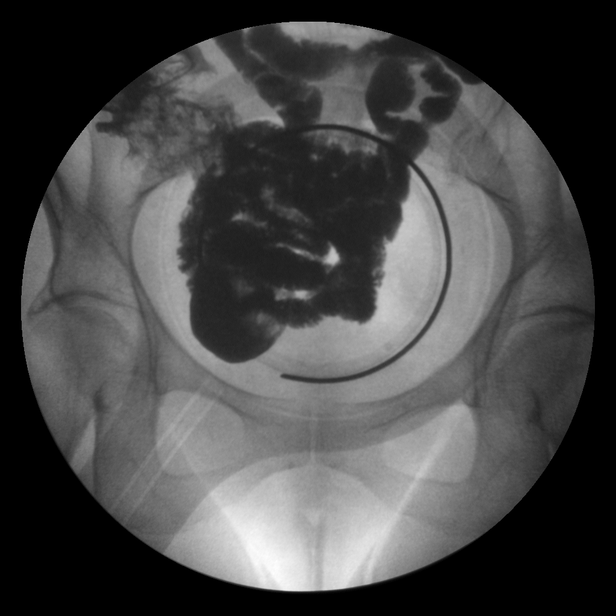

[Series 8: run · 1 of 1 slices shown (5 of 15)]
[im 1/1]
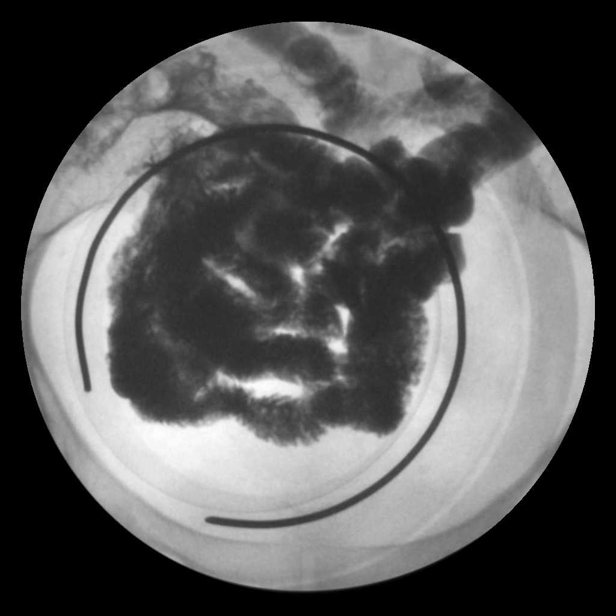

[Series 9: run · 1 of 1 slices shown (6 of 15)]
[im 1/1]
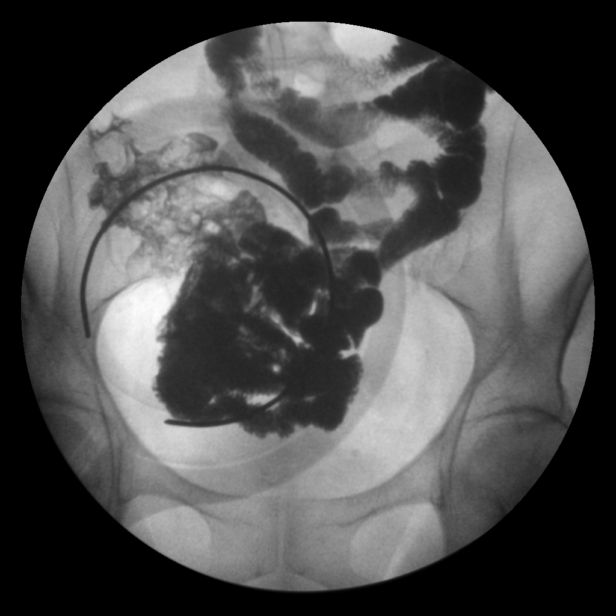

[Series 12: run · 1 of 1 slices shown (7 of 15)]
[im 1/1]
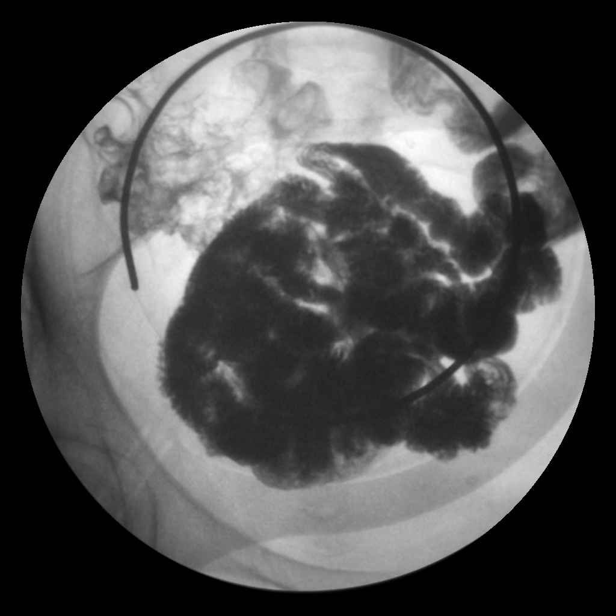

[Series 14: run · 1 of 1 slices shown (8 of 15)]
[im 1/1]
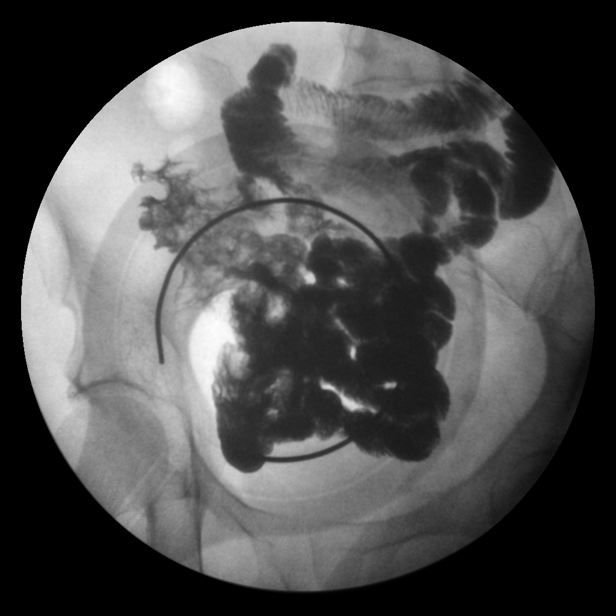

[Series 15: run · 1 of 1 slices shown (9 of 15)]
[im 1/1]
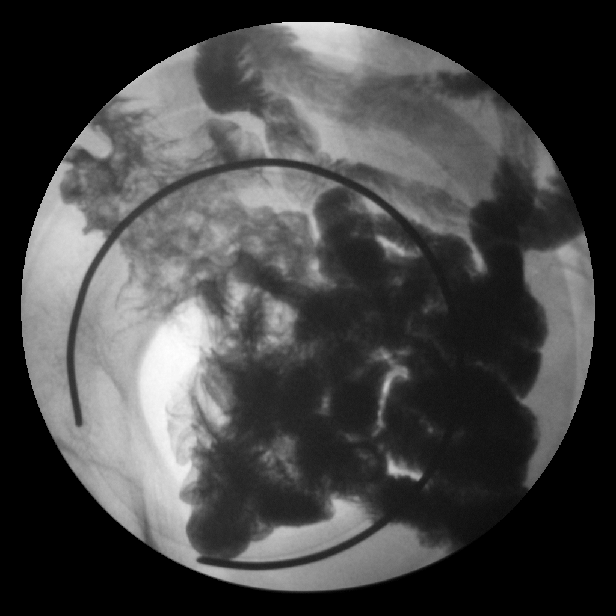

[Series 17: run · 1 of 1 slices shown (10 of 15)]
[im 1/1]
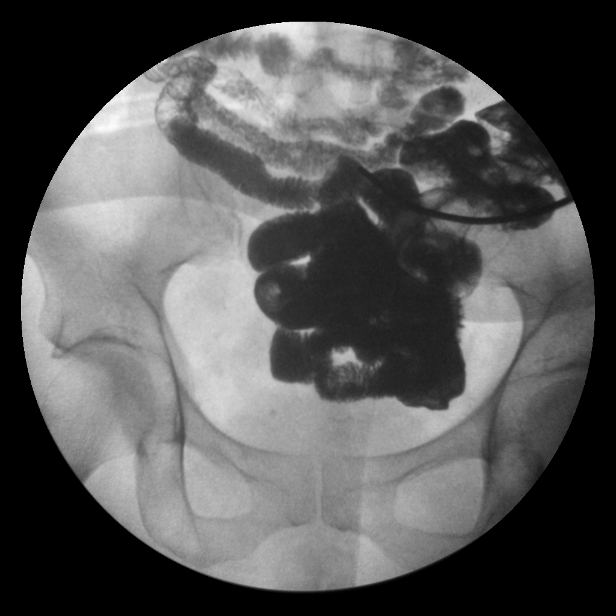

[Series 18: run · 1 of 1 slices shown (11 of 15)]
[im 1/1]
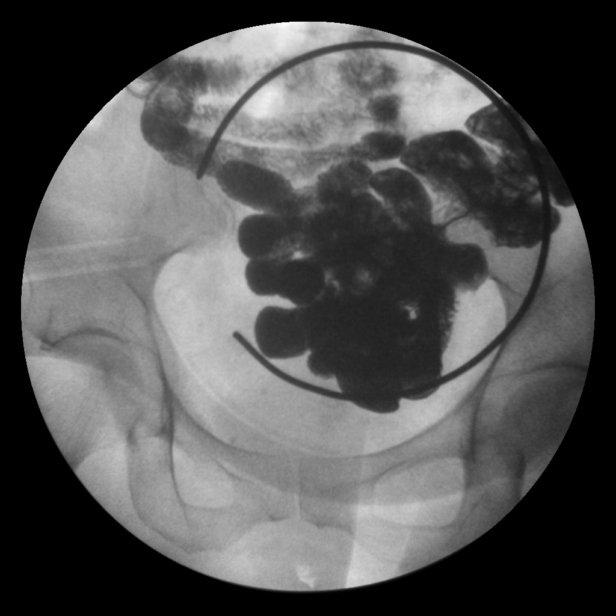

[Series 20: run · 1 of 1 slices shown (12 of 15)]
[im 1/1]
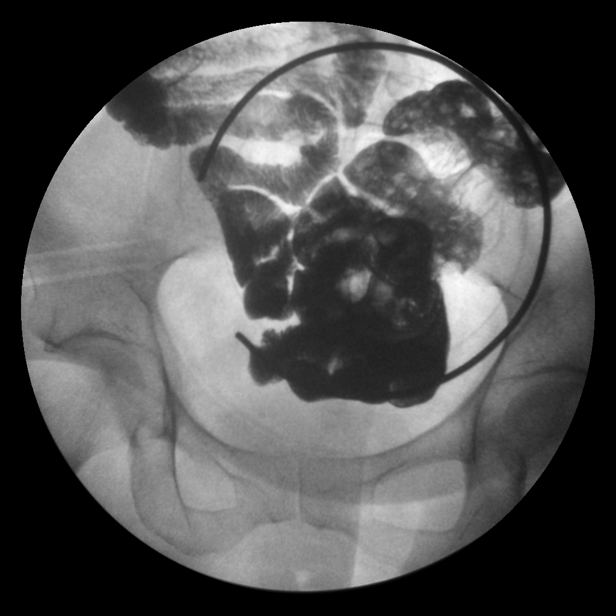

[Series 22: run · 1 of 1 slices shown (13 of 15)]
[im 1/1]
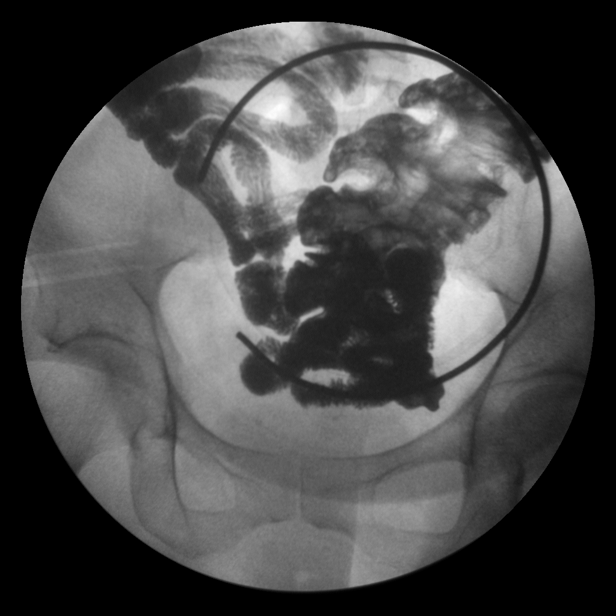

[Series 23: run · 1 of 1 slices shown (14 of 15)]
[im 1/1]
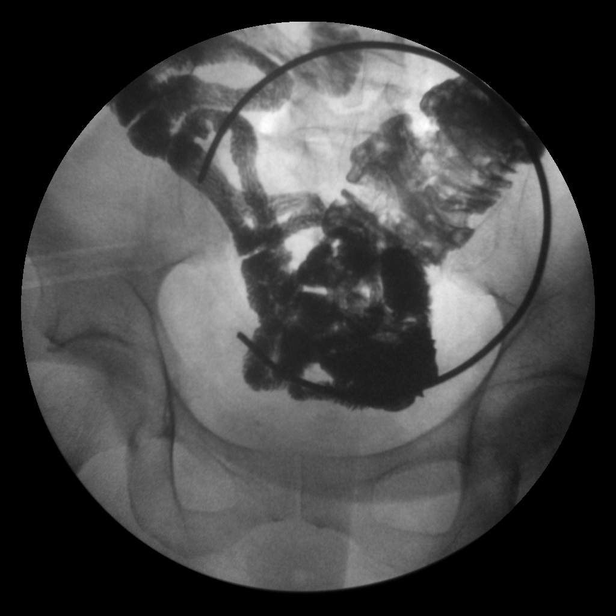

[Series 25: run · 1 of 1 slices shown (15 of 15)]
[im 1/1]
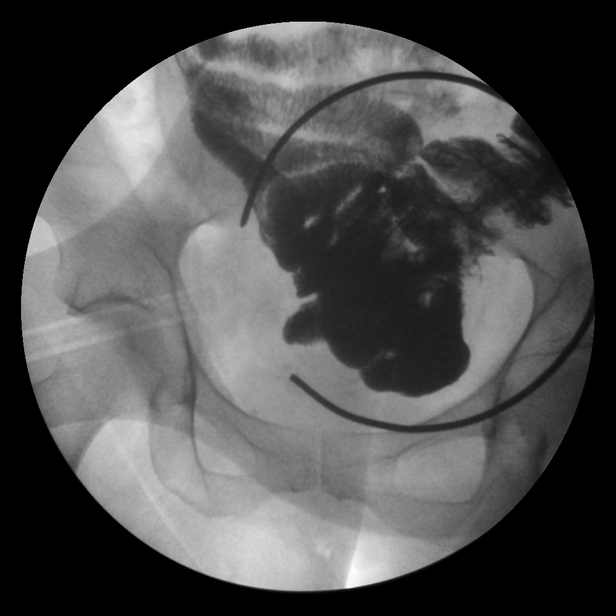

[15 of 24 positions shown; findings below may reference images not displayed]

FINDINGS: The patient ingested barium which traveled throughout the small bowel, entering the colon by 1 hour and 5 minutes after beginning the examination.  No evidence of small bowel obstructing or constricting lesion.  No focal area of ulceration or dilatation.  Terminal ileum was initially difficult to visualize, but subsequently was visualized and appears to be within normal limits.  Mild dextroscoliosis of the lumbar spine is noted.
IMPRESSION: No small bowel abnormality noted.

## 2008-11-05 ENCOUNTER — Telehealth (INDEPENDENT_AMBULATORY_CARE_PROVIDER_SITE_OTHER): Payer: Self-pay | Admitting: *Deleted

## 2008-11-06 ENCOUNTER — Ambulatory Visit: Payer: Self-pay | Admitting: Pulmonary Disease

## 2008-11-07 ENCOUNTER — Telehealth: Payer: Self-pay | Admitting: Pulmonary Disease

## 2008-11-09 ENCOUNTER — Ambulatory Visit: Payer: Self-pay | Admitting: Internal Medicine

## 2008-11-09 LAB — CONVERTED CEMR LAB
BUN: 4 mg/dL — ABNORMAL LOW (ref 6–23)
Basophils Absolute: 0.1 10*3/uL (ref 0.0–0.1)
Basophils Relative: 1 % (ref 0.0–3.0)
CO2: 30 meq/L (ref 19–32)
Calcium: 9.1 mg/dL (ref 8.4–10.5)
Chloride: 100 meq/L (ref 96–112)
Creatinine, Ser: 0.6 mg/dL (ref 0.4–1.2)
Eosinophils Absolute: 0.1 10*3/uL (ref 0.0–0.7)
Eosinophils Relative: 1.2 % (ref 0.0–5.0)
GFR calc Af Amer: 133 mL/min
GFR calc non Af Amer: 110 mL/min
Glucose, Bld: 117 mg/dL — ABNORMAL HIGH (ref 70–99)
HCT: 35.5 % — ABNORMAL LOW (ref 36.0–46.0)
Hemoglobin: 12.4 g/dL (ref 12.0–15.0)
Lymphocytes Relative: 36.5 % (ref 12.0–46.0)
MCHC: 34.9 g/dL (ref 30.0–36.0)
MCV: 98 fL (ref 78.0–100.0)
Monocytes Absolute: 0.9 10*3/uL (ref 0.1–1.0)
Monocytes Relative: 10 % (ref 3.0–12.0)
Neutro Abs: 4.3 10*3/uL (ref 1.4–7.7)
Neutrophils Relative %: 51.3 % (ref 43.0–77.0)
Platelets: 485 10*3/uL — ABNORMAL HIGH (ref 150–400)
Potassium: 3.9 meq/L (ref 3.5–5.1)
RBC: 3.62 M/uL — ABNORMAL LOW (ref 3.87–5.11)
RDW: 12.4 % (ref 11.5–14.6)
Sodium: 138 meq/L (ref 135–145)
TSH: 0.56 microintl units/mL (ref 0.35–5.50)
WBC: 8.5 10*3/uL (ref 4.5–10.5)

## 2008-11-16 ENCOUNTER — Encounter: Admission: RE | Admit: 2008-11-16 | Discharge: 2008-11-16 | Payer: Self-pay | Admitting: Orthopaedic Surgery

## 2008-11-29 LAB — CONVERTED CEMR LAB
Bilirubin Urine: NEGATIVE
Hemoglobin, Urine: NEGATIVE
Ketones, ur: NEGATIVE mg/dL
Leukocytes, UA: NEGATIVE
Nitrite: NEGATIVE
Specific Gravity, Urine: <=1.005 (ref 1.000–1.03)
Total Protein, Urine: NEGATIVE mg/dL
Urine Glucose: NEGATIVE mg/dL
Urobilinogen, UA: 0.2 (ref 0.0–1.0)
pH: 6 (ref 5.0–8.0)

## 2008-12-11 ENCOUNTER — Ambulatory Visit: Payer: Self-pay | Admitting: Internal Medicine

## 2009-01-03 ENCOUNTER — Telehealth: Payer: Self-pay | Admitting: Gastroenterology

## 2009-01-04 ENCOUNTER — Ambulatory Visit: Payer: Self-pay | Admitting: Internal Medicine

## 2009-01-04 DIAGNOSIS — K219 Gastro-esophageal reflux disease without esophagitis: Secondary | ICD-10-CM | POA: Insufficient documentation

## 2009-01-04 DIAGNOSIS — R1013 Epigastric pain: Secondary | ICD-10-CM | POA: Insufficient documentation

## 2009-01-08 ENCOUNTER — Telehealth: Payer: Self-pay | Admitting: Gastroenterology

## 2009-01-08 ENCOUNTER — Ambulatory Visit: Payer: Self-pay | Admitting: Gastroenterology

## 2009-01-11 ENCOUNTER — Ambulatory Visit: Payer: Self-pay | Admitting: Cardiology

## 2009-01-14 ENCOUNTER — Ambulatory Visit: Payer: Self-pay | Admitting: Gastroenterology

## 2009-01-17 ENCOUNTER — Ambulatory Visit (HOSPITAL_COMMUNITY): Admission: RE | Admit: 2009-01-17 | Discharge: 2009-01-17 | Payer: Self-pay | Admitting: Gastroenterology

## 2009-01-22 ENCOUNTER — Telehealth: Payer: Self-pay | Admitting: Gastroenterology

## 2009-02-18 ENCOUNTER — Telehealth: Payer: Self-pay | Admitting: Gastroenterology

## 2009-03-06 ENCOUNTER — Encounter: Admission: RE | Admit: 2009-03-06 | Discharge: 2009-03-06 | Payer: Self-pay | Admitting: Orthopaedic Surgery

## 2009-03-06 ENCOUNTER — Telehealth: Payer: Self-pay | Admitting: Gastroenterology

## 2009-04-22 IMAGING — MR MR MRA HEAD W/O CM
8 of 12 series · 24 of 48 positions shown · IV contrast (magnevist)
Comparison: None.

CLINICAL DATA: 56-year-old female, severe vertigo and nausea.  Colon cancer.  Evaluate for mets.  
MRI BRAIN WITHOUT AND WITH CONTRAST:
TECHNIQUE: Multiplanar and multiecho pulse sequences of the brain and surrounding structures were obtained according to standard protocol before and after administration of intravenous contrast.
Contrast:  10 cc Magnevist.
TECHNIQUE: 3-D time of flight pulse sequence was performed to examine the cerebral vasculature, centered at the circle of Willis, without IV contrast.  Multiplanar MR image reconstructions were generated to evaluate the vascular anatomy.

[Series 2: DWI · axial · 5.0mm · 0.94mm/px · z∈[-42,+101]mm · 5 of 54 slices shown (1 of 2)]
[im 1/54]
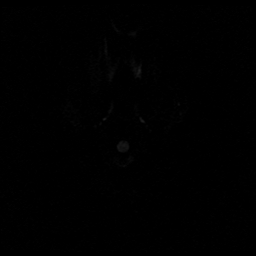
[im 14/54]
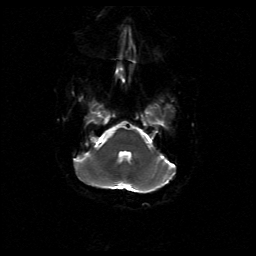
[im 27/54]
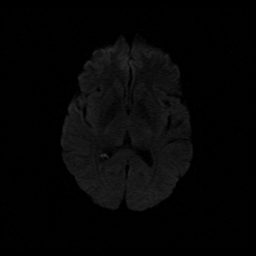
[im 40/54]
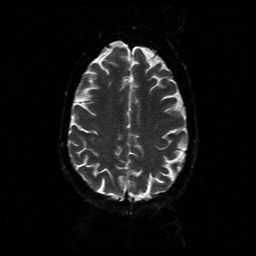
[im 54/54]
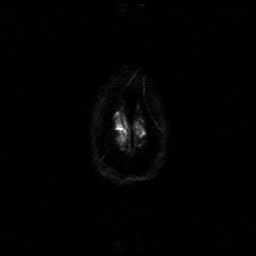

[Series 4: T1 · sagittal · 5.0mm · 0.47mm/px · 2 of 22 slices shown]
[im 1/22]
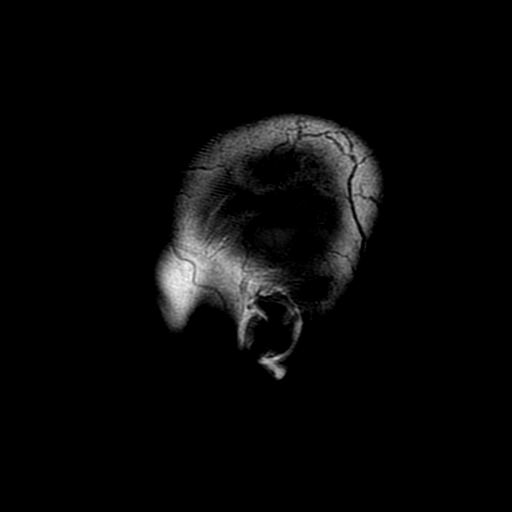
[im 22/22]
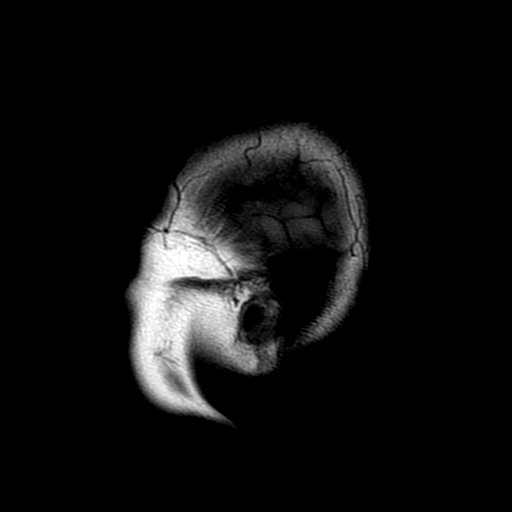

[Series 5: T2 · axial · 5.0mm · 0.47mm/px · z∈[-32,+105]mm · 3 of 24 slices shown (1 of 2)]
[im 1/24]
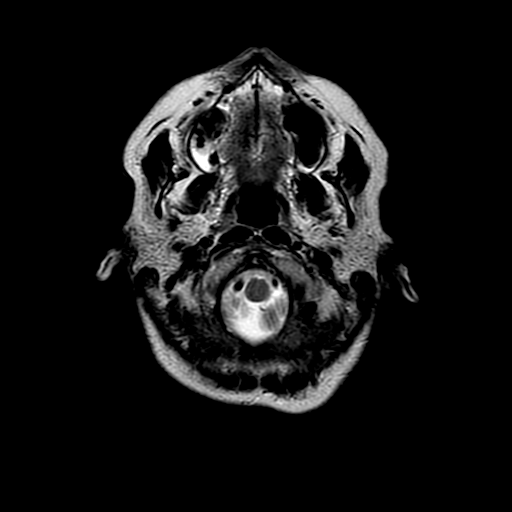
[im 12/24]
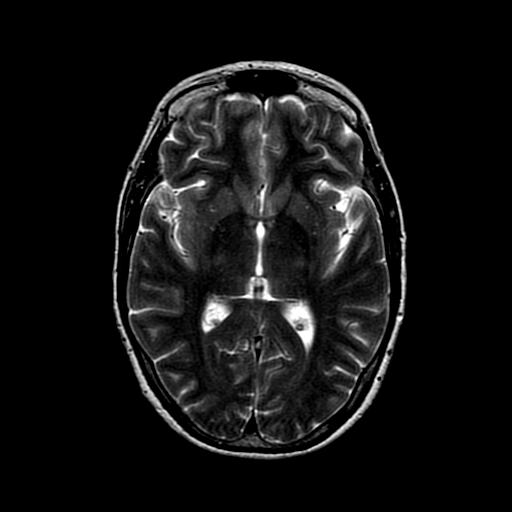
[im 24/24]
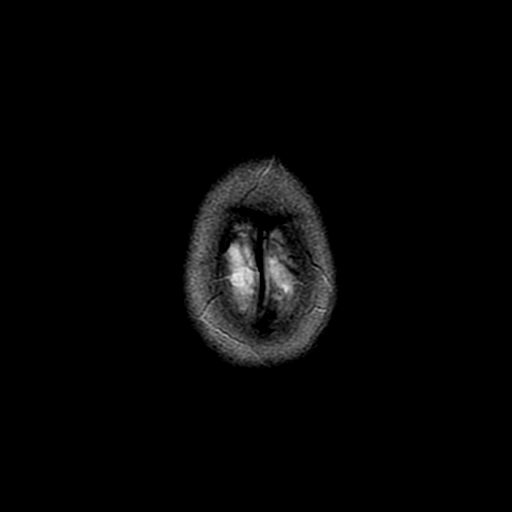

[Series 6: FLAIR · axial · 5.0mm · 0.47mm/px · z∈[-32,+105]mm · 3 of 24 slices shown]
[im 1/24]
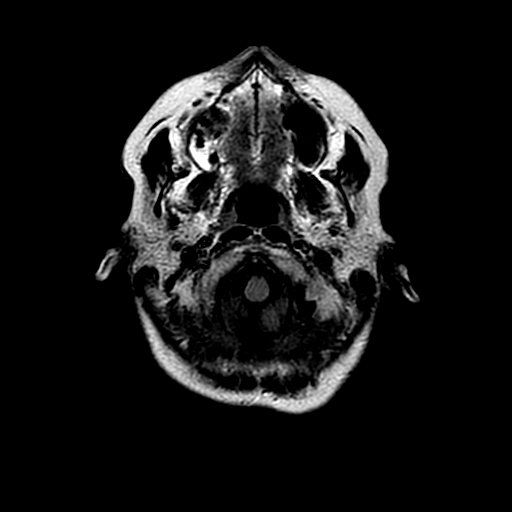
[im 12/24]
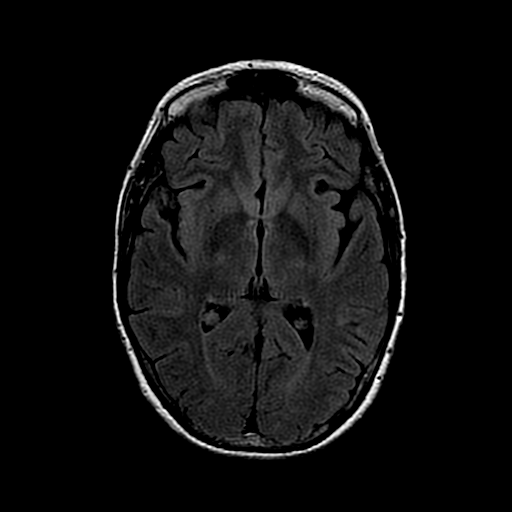
[im 24/24]
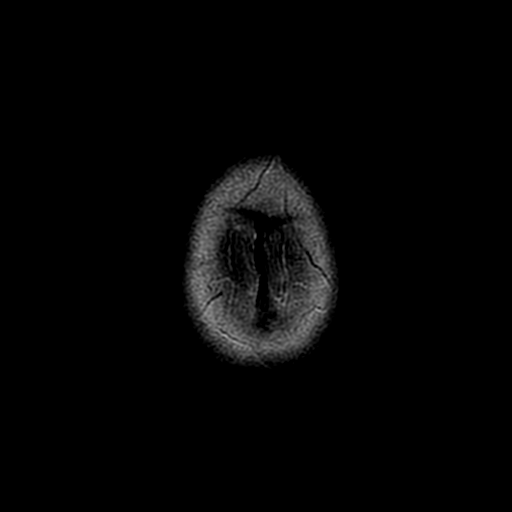

[Series 9: T2 · coronal · 5.0mm · 0.51mm/px · 3 of 24 slices shown (2 of 2)]
[im 1/24]
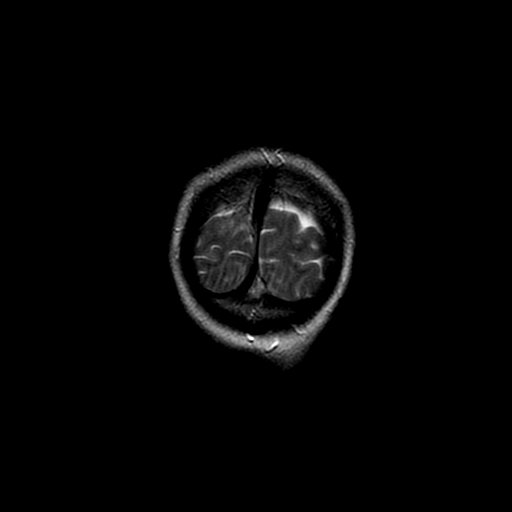
[im 12/24]
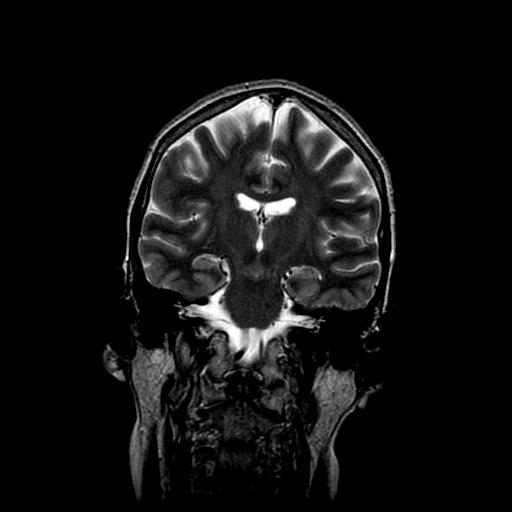
[im 24/24]
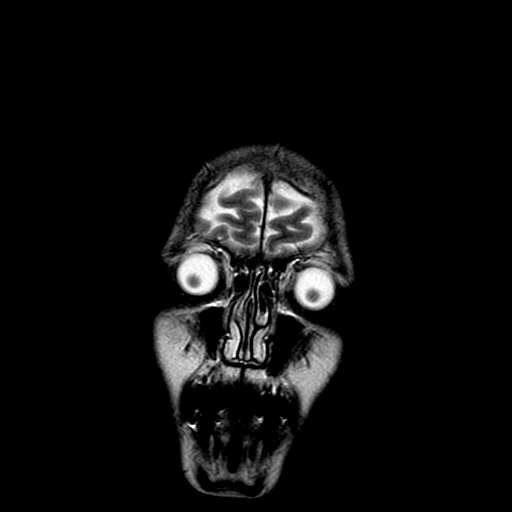

[Series 11: T1 post-contrast · coronal · 5.0mm · 0.51mm/px · 3 of 24 slices shown (1 of 2)]
[im 1/24]
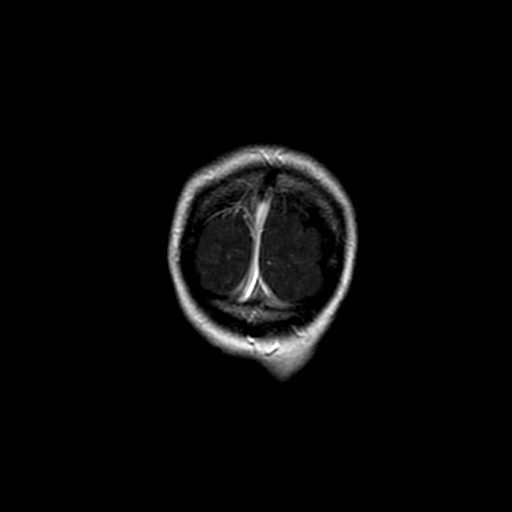
[im 12/24]
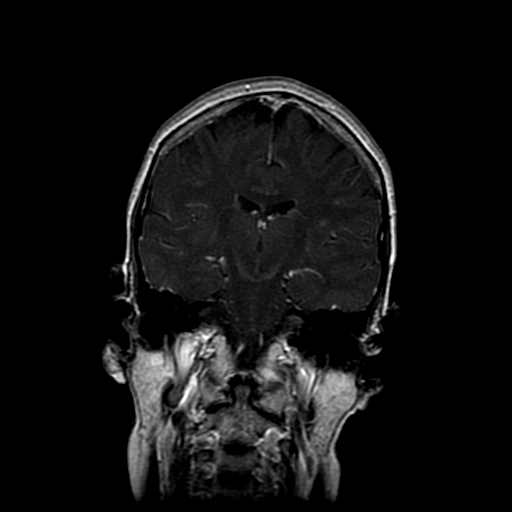
[im 24/24]
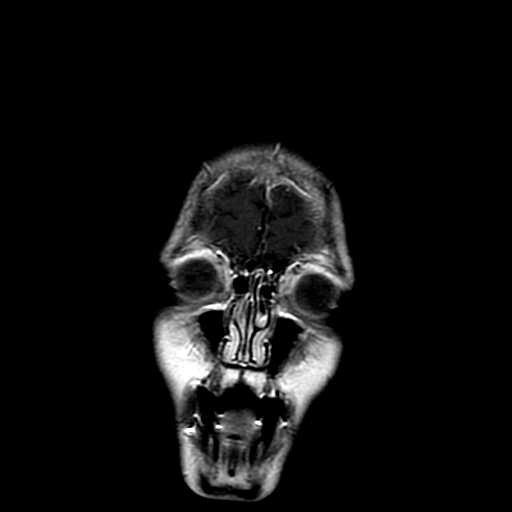

[Series 12: T1 post-contrast · sagittal · 5.0mm · 0.47mm/px · 2 of 22 slices shown (2 of 2)]
[im 1/22]
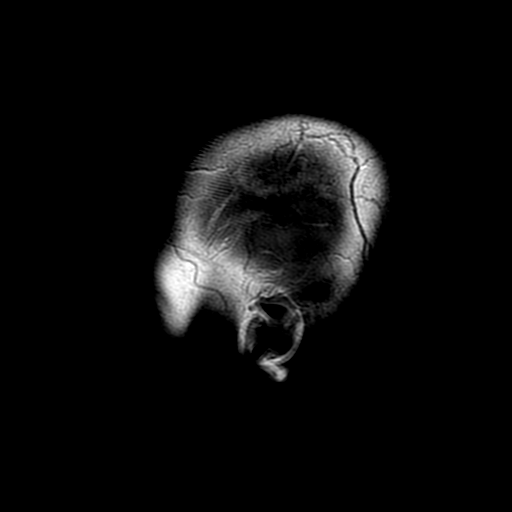
[im 22/22]
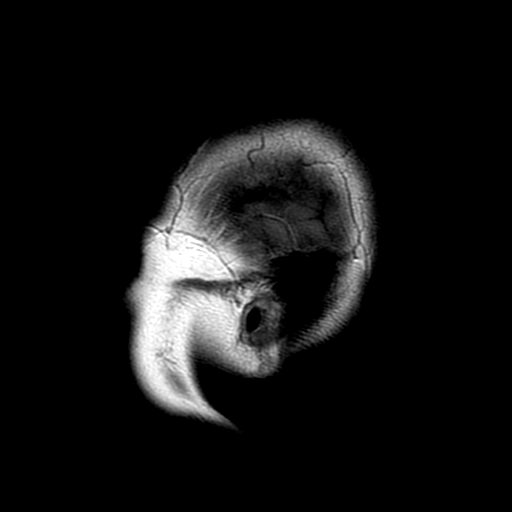

[Series 532: DWI · axial · 5.0mm · 0.94mm/px · z∈[-42,+101]mm · 3 of 27 slices shown (2 of 2)]
[im 1/27]
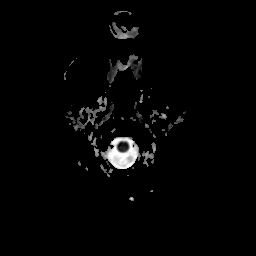
[im 14/27]
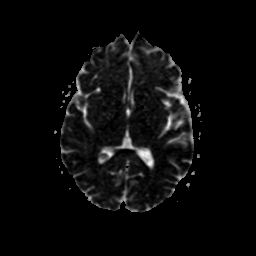
[im 27/27]
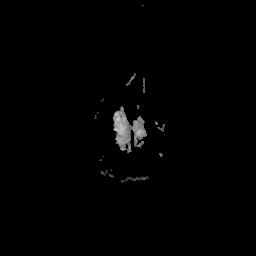

[24 of 48 positions shown; findings below may reference images not displayed]

FINDINGS: Postcontrast images demonstrate no evidence for metastatic disease to the brain or calvarium.  Four to five scattered subcortical T2- hyperintensities are at the upper limit of normal for age.  Diffusion-weighted images are unremarkable.  No acute intracranial abnormalities are present.  Specifically, there is no evidence for acute infarct, hemorrhage, mass, hydrocephalus, or extraaxial fluid collection.  The paranasal sinuses and mastoid air cells are clear.  Flow is present in the major intracranial arteries.  Globes and orbits are intact.
IMPRESSION: 1.   No acute intracranial abnormality.
2.  No evidence for metastatic disease to the brain. 
MR ANGIOGRAPHY OF HEAD:
FINDINGS: The right internal carotid artery is within normal limits from the high cervical segment through the ICA terminus.  A duplicated right middle cerebral artery is evident, a normal variant.  The A1 segment is within normal limits. 
The right vertebral artery is dominant to the left.  The right superior cerebral artery is duplicated, a normal variant.  Both posterior cerebral arteries originate from the basilar tip.  There are no significant posterior communicating arteries.
IMPRESSION: Normal variant MRA circle of Willis.

## 2009-04-22 IMAGING — CR DG CHEST 1V PORT
1 series · 1 of 1 positions shown · non-contrast
Comparison: none

CLINICAL DATA: Weakness and dizziness

Portable chest at 9092:
Comparison 10/04/2006. Lungs are clear, slightly hyperinflated. Heart size
normal. No effusion.

[view not recorded]
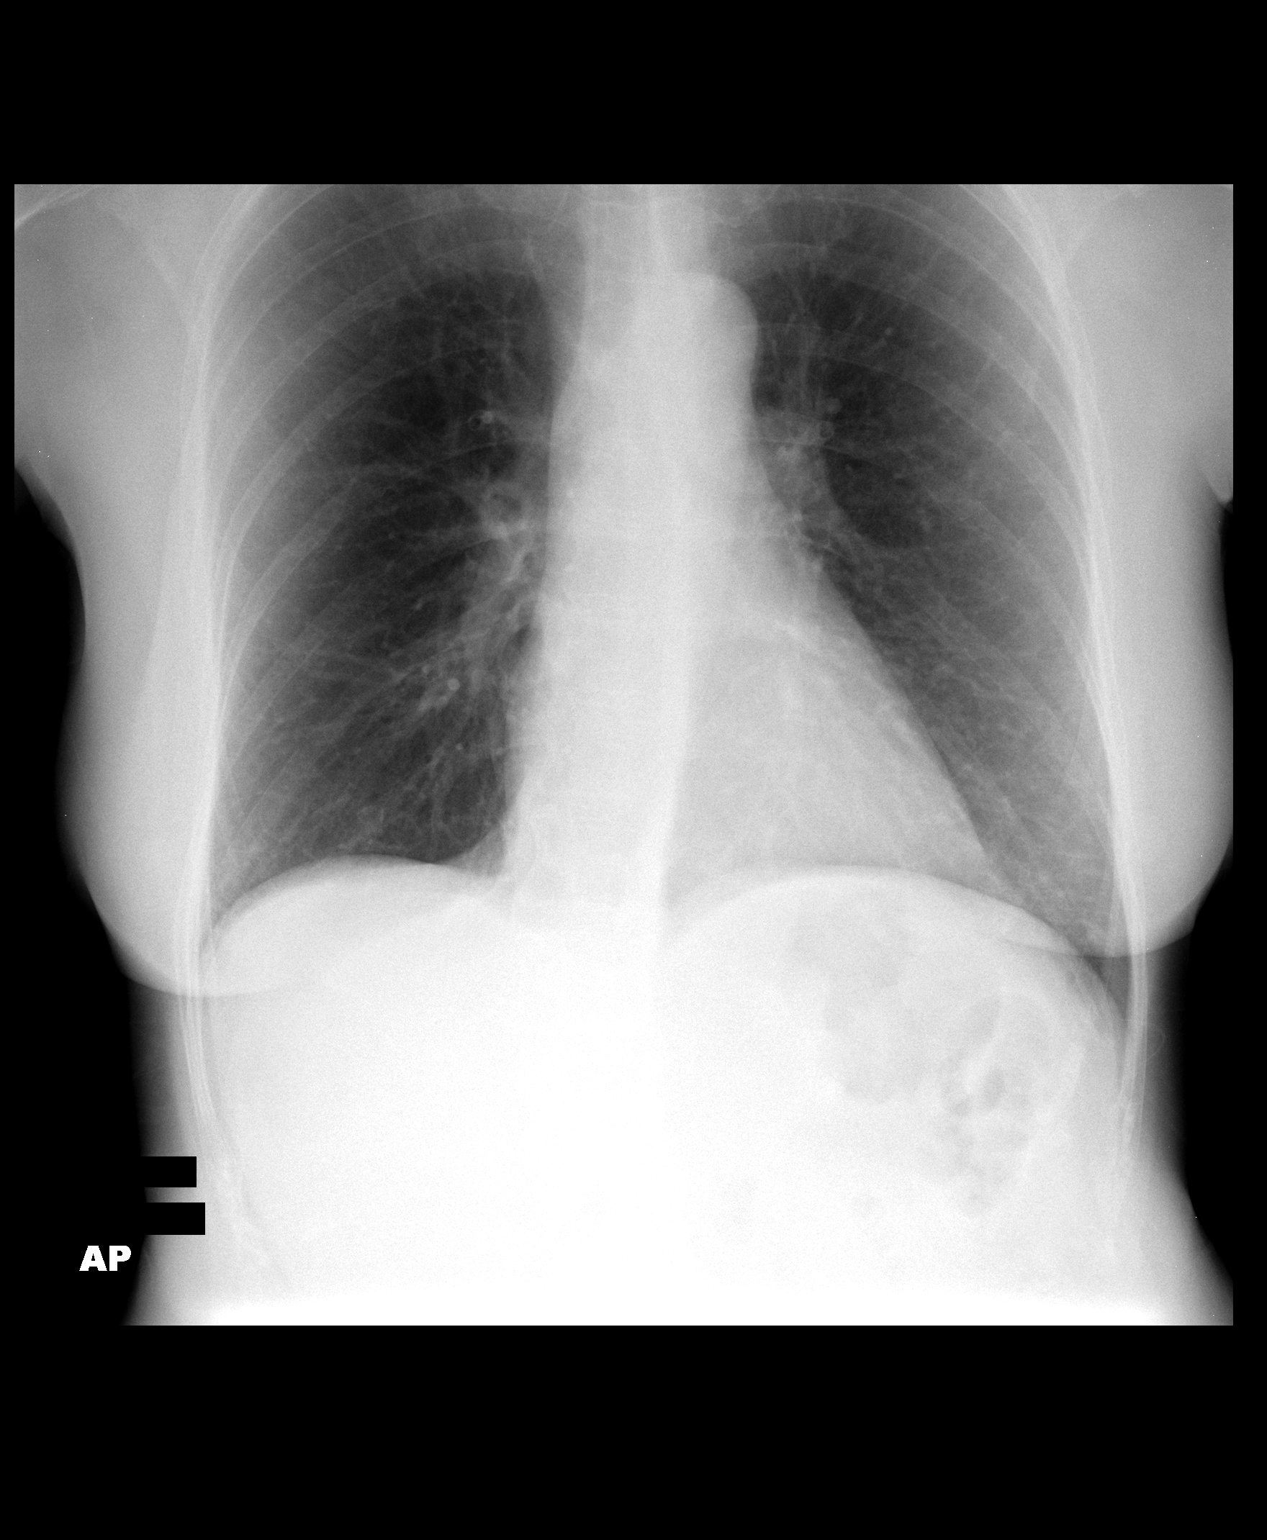

[1 of 1 positions shown; findings below may reference images not displayed]

IMPRESSION: 1. No acute disease

## 2009-06-03 ENCOUNTER — Encounter: Payer: Self-pay | Admitting: Adult Health

## 2009-06-03 ENCOUNTER — Ambulatory Visit: Payer: Self-pay | Admitting: Internal Medicine

## 2009-06-03 LAB — CONVERTED CEMR LAB: Vit D, 25-Hydroxy: 49 ng/mL (ref 30–89)

## 2009-06-07 LAB — CONVERTED CEMR LAB
ALT: 15 units/L (ref 0–35)
AST: 34 units/L (ref 0–37)
Albumin: 3.9 g/dL (ref 3.5–5.2)
Alkaline Phosphatase: 113 units/L (ref 39–117)
BUN: 7 mg/dL (ref 6–23)
Basophils Relative: 5.4 % — ABNORMAL HIGH (ref 0.0–3.0)
Bilirubin, Direct: 0.3 mg/dL (ref 0.0–0.3)
CO2: 33 meq/L — ABNORMAL HIGH (ref 19–32)
Calcium: 9.6 mg/dL (ref 8.4–10.5)
Chloride: 104 meq/L (ref 96–112)
Creatinine, Ser: 0.6 mg/dL (ref 0.4–1.2)
Eosinophils Absolute: 0.1 10*3/uL (ref 0.0–0.7)
Eosinophils Relative: 0.9 % (ref 0.0–5.0)
GFR calc non Af Amer: 109.9 mL/min (ref >60)
Glucose, Bld: 91 mg/dL (ref 70–99)
HCT: 47.5 % — ABNORMAL HIGH (ref 36.0–46.0)
Hemoglobin: 16.6 g/dL — ABNORMAL HIGH (ref 12.0–15.0)
Lymphocytes Relative: 43.6 % (ref 12.0–46.0)
Lymphs Abs: 4.9 10*3/uL — ABNORMAL HIGH (ref 0.7–4.0)
MCHC: 35 g/dL (ref 30.0–36.0)
MCV: 107.7 fL — ABNORMAL HIGH (ref 78.0–100.0)
Monocytes Absolute: 0.8 10*3/uL (ref 0.1–1.0)
Monocytes Relative: 6.9 % (ref 3.0–12.0)
Neutro Abs: 4.8 10*3/uL (ref 1.4–7.7)
Neutrophils Relative %: 43.2 % (ref 43.0–77.0)
Platelets: 358 10*3/uL (ref 150.0–400.0)
Potassium: 4.8 meq/L (ref 3.5–5.1)
RBC: 4.41 M/uL (ref 3.87–5.11)
RDW: 14.7 % — ABNORMAL HIGH (ref 11.5–14.6)
Sodium: 142 meq/L (ref 135–145)
TSH: 0.46 microintl units/mL (ref 0.35–5.50)
Total Bilirubin: 0.8 mg/dL (ref 0.3–1.2)
Total Protein: 7.4 g/dL (ref 6.0–8.3)
WBC: 11.2 10*3/uL — ABNORMAL HIGH (ref 4.5–10.5)

## 2009-06-12 ENCOUNTER — Encounter: Admission: RE | Admit: 2009-06-12 | Discharge: 2009-06-12 | Payer: Self-pay | Admitting: Orthopaedic Surgery

## 2009-07-02 ENCOUNTER — Telehealth (INDEPENDENT_AMBULATORY_CARE_PROVIDER_SITE_OTHER): Payer: Self-pay | Admitting: *Deleted

## 2009-07-18 ENCOUNTER — Encounter: Payer: Self-pay | Admitting: Adult Health

## 2009-08-06 ENCOUNTER — Ambulatory Visit: Payer: Self-pay | Admitting: Pulmonary Disease

## 2009-08-07 IMAGING — CR DG CHEST 2V
2 series · 2 of 2 positions shown · non-contrast
Comparison: 08/02/07.

CLINICAL DATA: Cough and shortness of breath.
 CHEST - 2 VIEWS:

[view not recorded (1 of 2)]
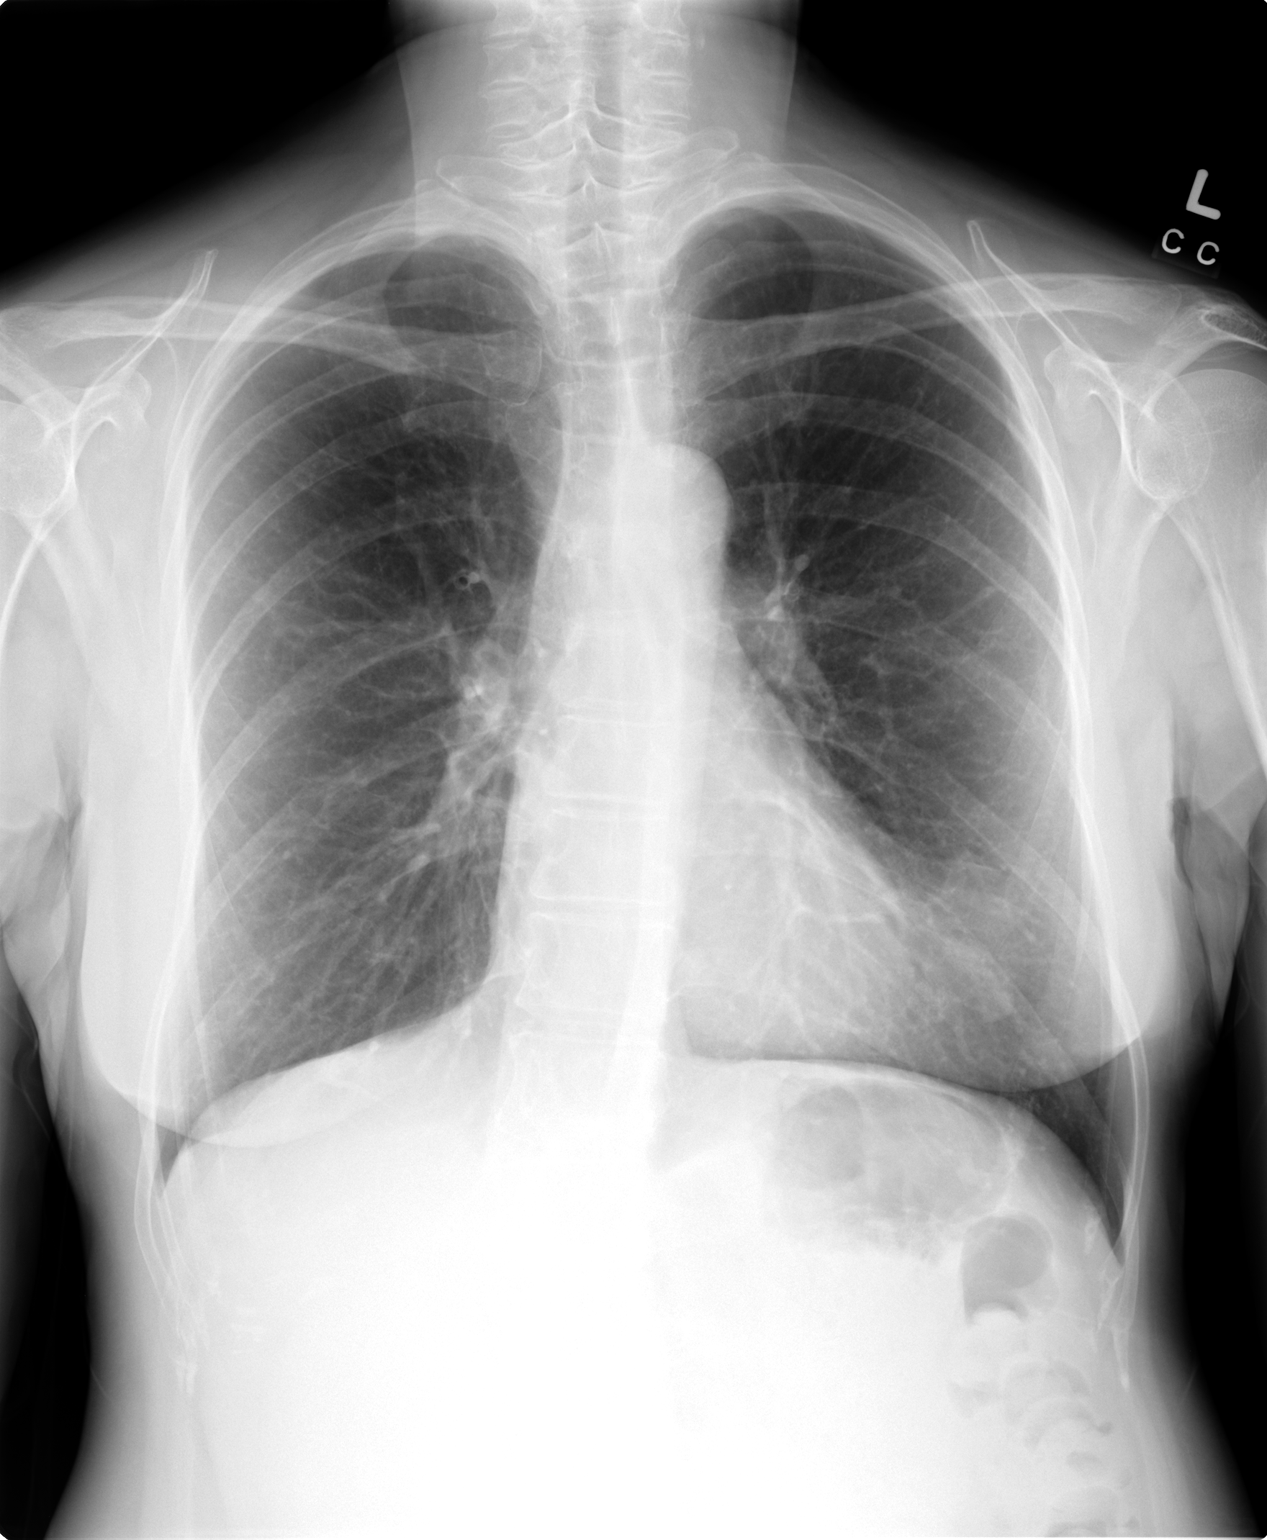

[view not recorded (2 of 2)]
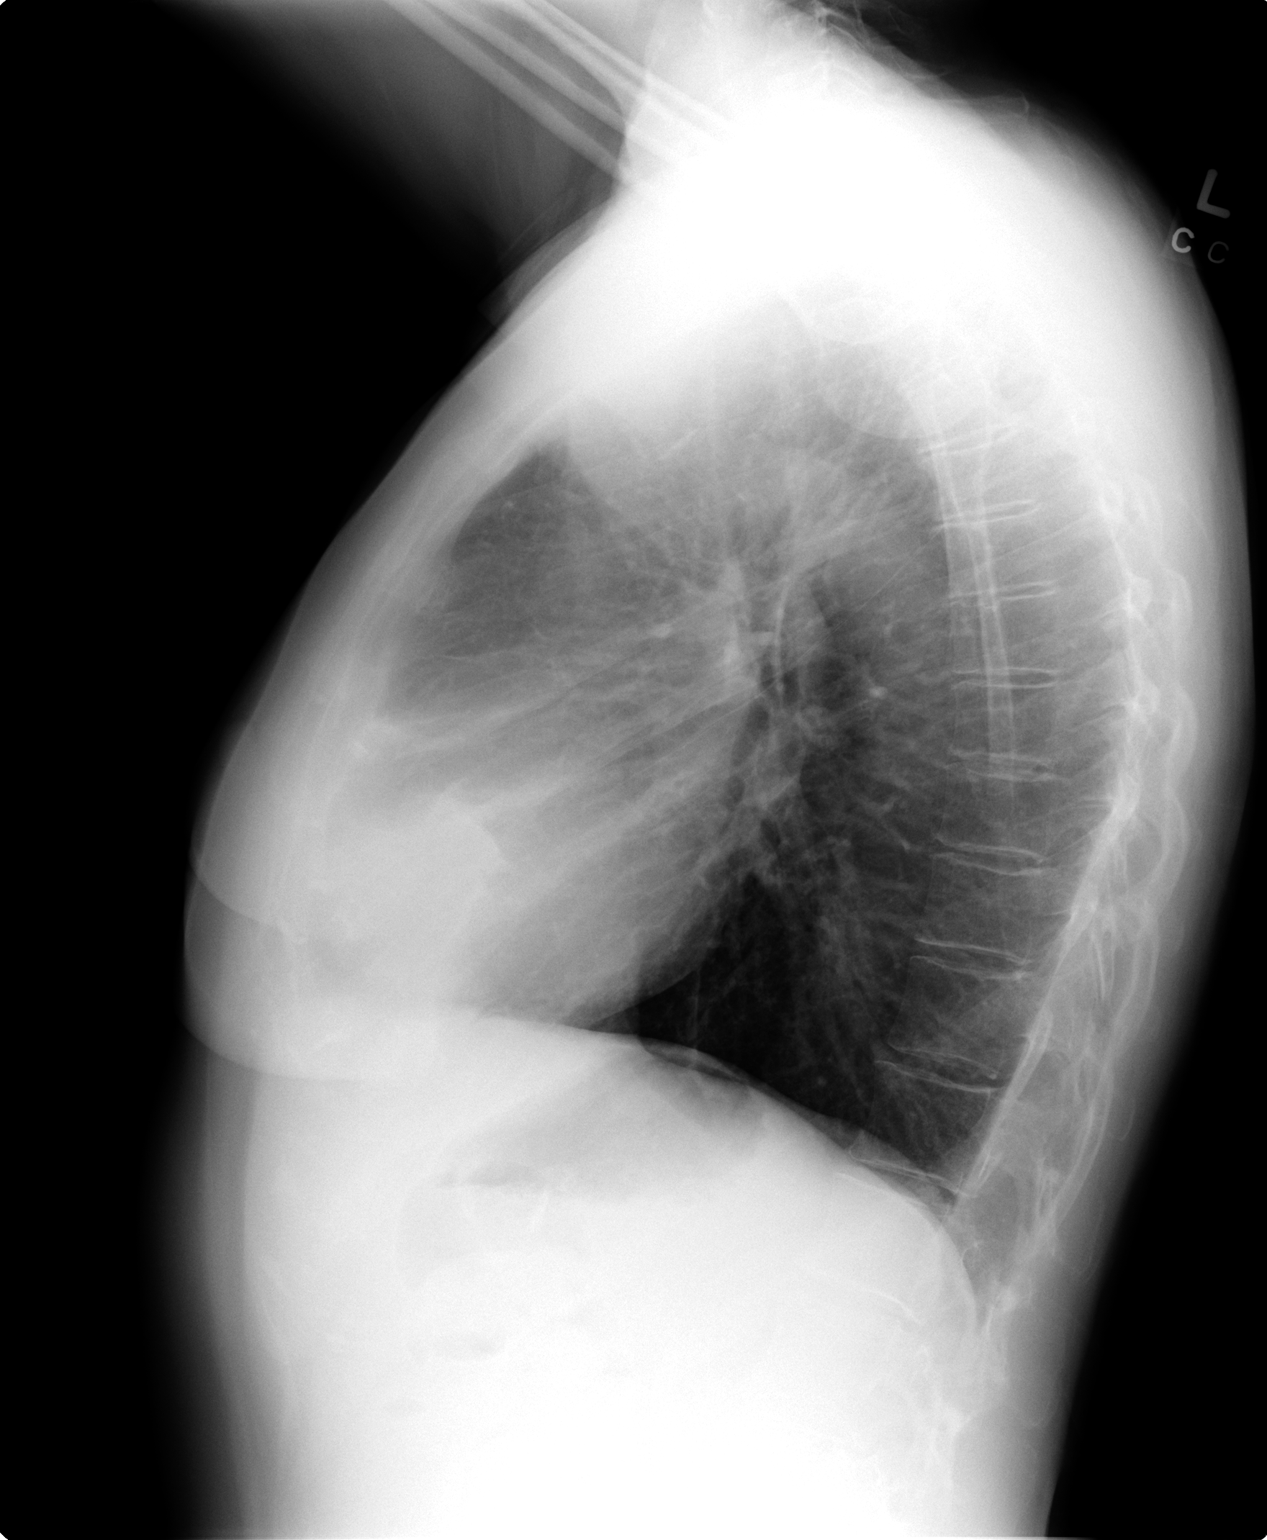

[2 of 2 positions shown; findings below may reference images not displayed]

FINDINGS: Focal density in the lingular segment of the left upper lobe which is compatible with pneumonia.  The lungs are hyperaerated with diaphragmatic flattening.  Normal cardiomediastinal silhouette size and contours.  Mild thoracolumbar scoliosis convex right.
IMPRESSION: Lingular pneumonia/atelectasis.  COPD/emphysema.

## 2009-09-30 ENCOUNTER — Encounter: Payer: Self-pay | Admitting: Pulmonary Disease

## 2009-10-02 ENCOUNTER — Ambulatory Visit: Payer: Self-pay | Admitting: Pulmonary Disease

## 2009-10-04 DIAGNOSIS — E538 Deficiency of other specified B group vitamins: Secondary | ICD-10-CM | POA: Insufficient documentation

## 2009-10-04 LAB — CONVERTED CEMR LAB
Folate: 8.1 ng/mL
Vitamin B-12: 118 pg/mL — ABNORMAL LOW (ref 211–911)

## 2009-10-07 ENCOUNTER — Encounter: Admission: RE | Admit: 2009-10-07 | Discharge: 2009-10-07 | Payer: Self-pay | Admitting: Orthopaedic Surgery

## 2009-10-08 ENCOUNTER — Telehealth (INDEPENDENT_AMBULATORY_CARE_PROVIDER_SITE_OTHER): Payer: Self-pay | Admitting: *Deleted

## 2009-10-11 ENCOUNTER — Ambulatory Visit: Payer: Self-pay | Admitting: Pulmonary Disease

## 2009-10-25 ENCOUNTER — Ambulatory Visit: Payer: Self-pay | Admitting: Pulmonary Disease

## 2009-11-05 ENCOUNTER — Ambulatory Visit: Payer: Self-pay | Admitting: Pulmonary Disease

## 2009-11-05 ENCOUNTER — Encounter: Payer: Self-pay | Admitting: Adult Health

## 2009-12-13 ENCOUNTER — Ambulatory Visit: Payer: Self-pay | Admitting: Pulmonary Disease

## 2009-12-17 ENCOUNTER — Encounter: Payer: Self-pay | Admitting: Pulmonary Disease

## 2010-01-13 ENCOUNTER — Ambulatory Visit: Payer: Self-pay | Admitting: Pulmonary Disease

## 2010-01-19 ENCOUNTER — Emergency Department (HOSPITAL_COMMUNITY): Admission: EM | Admit: 2010-01-19 | Discharge: 2010-01-19 | Payer: Self-pay | Admitting: Emergency Medicine

## 2010-01-19 ENCOUNTER — Encounter (INDEPENDENT_AMBULATORY_CARE_PROVIDER_SITE_OTHER): Payer: Self-pay | Admitting: *Deleted

## 2010-01-24 ENCOUNTER — Encounter: Payer: Self-pay | Admitting: Pulmonary Disease

## 2010-01-27 ENCOUNTER — Ambulatory Visit: Payer: Self-pay | Admitting: Internal Medicine

## 2010-01-27 ENCOUNTER — Encounter: Payer: Self-pay | Admitting: Physician Assistant

## 2010-01-27 ENCOUNTER — Telehealth: Payer: Self-pay | Admitting: Gastroenterology

## 2010-01-29 ENCOUNTER — Encounter: Payer: Self-pay | Admitting: Physician Assistant

## 2010-01-30 LAB — CONVERTED CEMR LAB
BUN: 3 mg/dL — ABNORMAL LOW (ref 6–23)
Basophils Absolute: 0.1 10*3/uL (ref 0.0–0.1)
Basophils Relative: 0.6 % (ref 0.0–3.0)
CO2: 31 meq/L (ref 19–32)
Calcium: 8.9 mg/dL (ref 8.4–10.5)
Chloride: 101 meq/L (ref 96–112)
Creatinine, Ser: 0.5 mg/dL (ref 0.4–1.2)
Eosinophils Absolute: 0.1 10*3/uL (ref 0.0–0.7)
Eosinophils Relative: 1.4 % (ref 0.0–5.0)
GFR calc non Af Amer: 135.32 mL/min (ref >60)
Glucose, Bld: 93 mg/dL (ref 70–99)
HCT: 43.9 % (ref 36.0–46.0)
Hemoglobin: 15.2 g/dL — ABNORMAL HIGH (ref 12.0–15.0)
Lymphocytes Relative: 33.4 % (ref 12.0–46.0)
Lymphs Abs: 3.5 10*3/uL (ref 0.7–4.0)
MCHC: 34.7 g/dL (ref 30.0–36.0)
MCV: 99.6 fL (ref 78.0–100.0)
Monocytes Absolute: 0.6 10*3/uL (ref 0.1–1.0)
Monocytes Relative: 5.6 % (ref 3.0–12.0)
Neutro Abs: 6.1 10*3/uL (ref 1.4–7.7)
Neutrophils Relative %: 59 % (ref 43.0–77.0)
Platelets: 428 10*3/uL — ABNORMAL HIGH (ref 150.0–400.0)
Potassium: 3.2 meq/L — ABNORMAL LOW (ref 3.5–5.1)
RBC: 4.41 M/uL (ref 3.87–5.11)
RDW: 12.4 % (ref 11.5–14.6)
Sodium: 140 meq/L (ref 135–145)
WBC: 10.4 10*3/uL (ref 4.5–10.5)

## 2010-02-03 ENCOUNTER — Emergency Department (HOSPITAL_COMMUNITY): Admission: EM | Admit: 2010-02-03 | Discharge: 2010-02-03 | Payer: Self-pay | Admitting: Emergency Medicine

## 2010-02-03 ENCOUNTER — Telehealth: Payer: Self-pay | Admitting: Physician Assistant

## 2010-02-05 ENCOUNTER — Ambulatory Visit: Payer: Self-pay | Admitting: Pulmonary Disease

## 2010-02-05 ENCOUNTER — Telehealth (INDEPENDENT_AMBULATORY_CARE_PROVIDER_SITE_OTHER): Payer: Self-pay | Admitting: *Deleted

## 2010-02-07 ENCOUNTER — Telehealth: Payer: Self-pay | Admitting: Physician Assistant

## 2010-02-08 DIAGNOSIS — G8929 Other chronic pain: Secondary | ICD-10-CM | POA: Insufficient documentation

## 2010-02-08 DIAGNOSIS — G894 Chronic pain syndrome: Secondary | ICD-10-CM | POA: Insufficient documentation

## 2010-02-08 LAB — CONVERTED CEMR LAB
ALT: 14 units/L (ref 0–35)
AST: 23 units/L (ref 0–37)
Albumin: 3.7 g/dL (ref 3.5–5.2)
Alkaline Phosphatase: 66 units/L (ref 39–117)
BUN: 4 mg/dL — ABNORMAL LOW (ref 6–23)
Basophils Absolute: 0 10*3/uL (ref 0.0–0.1)
Basophils Relative: 0.2 % (ref 0.0–3.0)
Bilirubin, Direct: 0.1 mg/dL (ref 0.0–0.3)
CO2: 32 meq/L (ref 19–32)
Calcium: 9.3 mg/dL (ref 8.4–10.5)
Chloride: 102 meq/L (ref 96–112)
Cholesterol: 142 mg/dL (ref 0–200)
Creatinine, Ser: 0.6 mg/dL (ref 0.4–1.2)
Eosinophils Absolute: 0.1 10*3/uL (ref 0.0–0.7)
Eosinophils Relative: 0.9 % (ref 0.0–5.0)
Folate: 8.7 ng/mL
GFR calc non Af Amer: 109.63 mL/min (ref >60)
Glucose, Bld: 95 mg/dL (ref 70–99)
HCT: 42.1 % (ref 36.0–46.0)
HDL: 49.7 mg/dL (ref >39.00)
Hemoglobin: 13.9 g/dL (ref 12.0–15.0)
Iron: 121 ug/dL (ref 42–145)
LDL Cholesterol: 76 mg/dL (ref 0–99)
Lymphocytes Relative: 35 % (ref 12.0–46.0)
Lymphs Abs: 4 10*3/uL (ref 0.7–4.0)
MCHC: 33.1 g/dL (ref 30.0–36.0)
MCV: 100.6 fL — ABNORMAL HIGH (ref 78.0–100.0)
Monocytes Absolute: 0.7 10*3/uL (ref 0.1–1.0)
Monocytes Relative: 6.3 % (ref 3.0–12.0)
Neutro Abs: 6.7 10*3/uL (ref 1.4–7.7)
Neutrophils Relative %: 57.6 % (ref 43.0–77.0)
Platelets: 399 10*3/uL (ref 150.0–400.0)
Potassium: 3.9 meq/L (ref 3.5–5.1)
RBC: 4.18 M/uL (ref 3.87–5.11)
RDW: 12.2 % (ref 11.5–14.6)
Saturation Ratios: 52.8 % — ABNORMAL HIGH (ref 20.0–50.0)
Sodium: 140 meq/L (ref 135–145)
TSH: 0.31 microintl units/mL — ABNORMAL LOW (ref 0.35–5.50)
Total Bilirubin: 0.3 mg/dL (ref 0.3–1.2)
Total CHOL/HDL Ratio: 3
Total Protein: 7.1 g/dL (ref 6.0–8.3)
Transferrin: 163.6 mg/dL — ABNORMAL LOW (ref 212.0–360.0)
Triglycerides: 84 mg/dL (ref 0.0–149.0)
VLDL: 16.8 mg/dL (ref 0.0–40.0)
Vit D, 25-Hydroxy: 30 ng/mL (ref 30–89)
Vitamin B-12: >1500 pg/mL — ABNORMAL HIGH (ref 211–911)
WBC: 11.5 10*3/uL — ABNORMAL HIGH (ref 4.5–10.5)

## 2010-02-12 IMAGING — CR DG THORACIC SPINE 2V
2 series · 2 of 2 positions shown · non-contrast
Comparison: 03/06/2008

CLINICAL DATA: Back pain.

THORACIC SPINE - 2 VIEW

[t t-spine a.p. *]
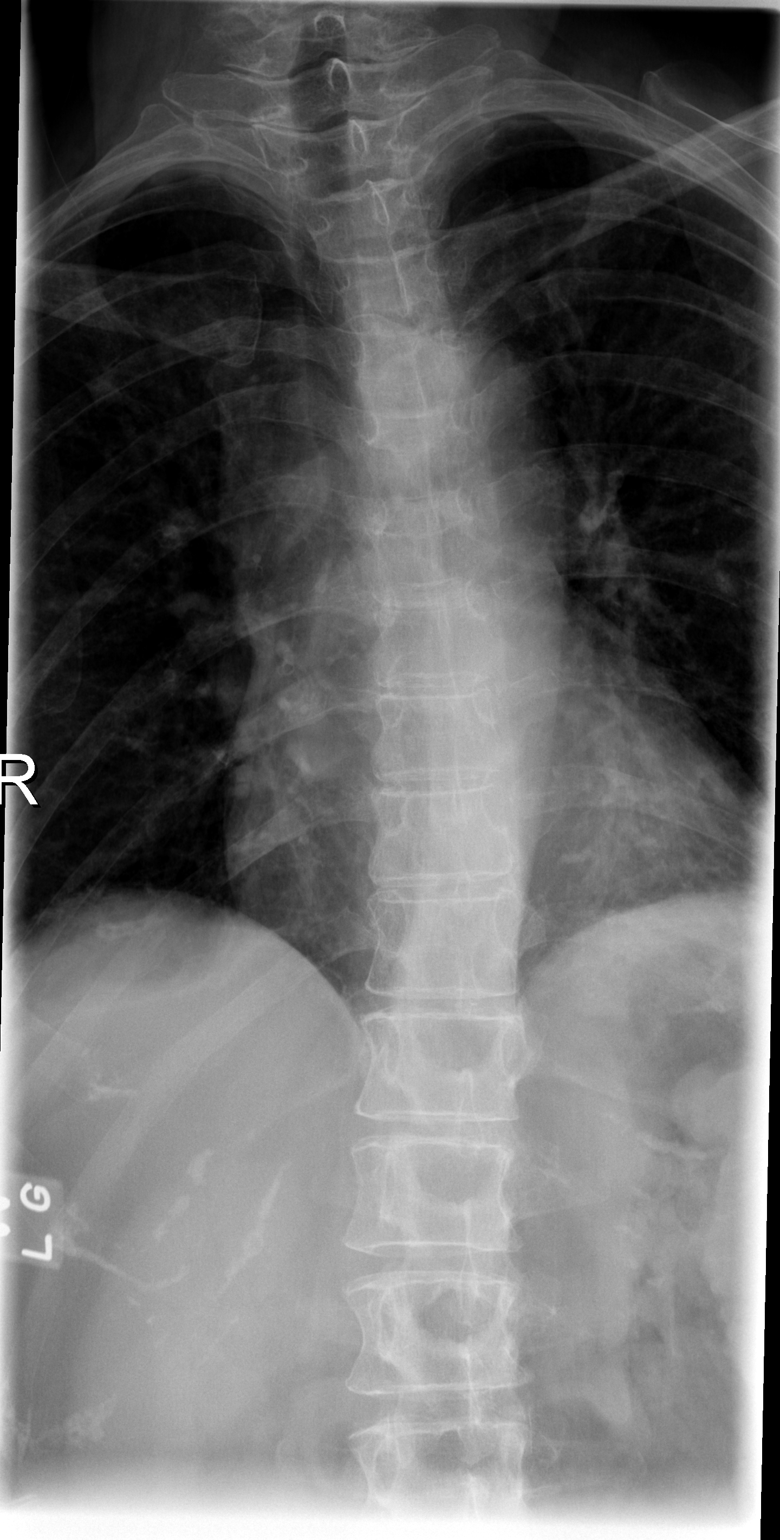

[t t-spine lat]
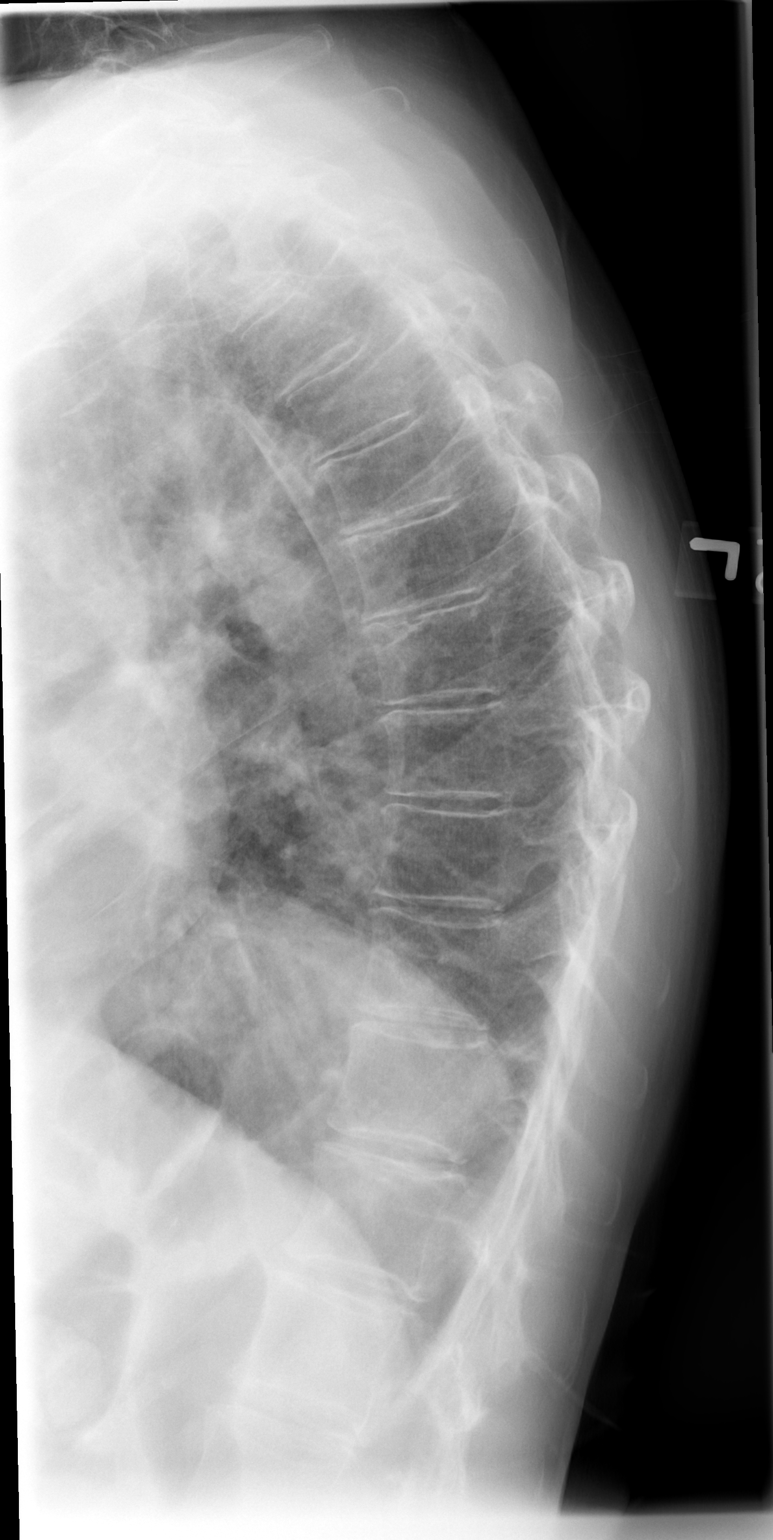

[2 of 2 positions shown; findings below may reference images not displayed]

FINDINGS: There is mild levoconvex thoracic scoliosis which may be
positional in nature.  Mild thoracic kyphosis is present.

No cortical discontinuity or subluxation is identified to suggest
fracture or acute instability.
IMPRESSION: 1.  No acute thoracic findings.  If pain persists despite
conservative therapy, MRI follow-up may be warranted.

## 2010-02-14 IMAGING — MR MR THORACIC SPINE W/O CM
3 of 7 series · 7 of 48 positions shown · non-contrast
Comparison: Chest radiograph 05/24/2008.

CLINICAL DATA: 57-year-old female with mid back pain between the
shoulder blades.  The patient fell/heard a pop on [REDACTED].

MRI THORACIC SPINE WITHOUT CONTRAST
TECHNIQUE: Multiplanar and multiecho pulse sequences of the
thoracic spine were obtained without intravenous contrast.

[Series 4: T2 · sagittal · 4.0mm · 0.35mm/px · 3 of 12 slices shown (1 of 2)]
[im 3/12]
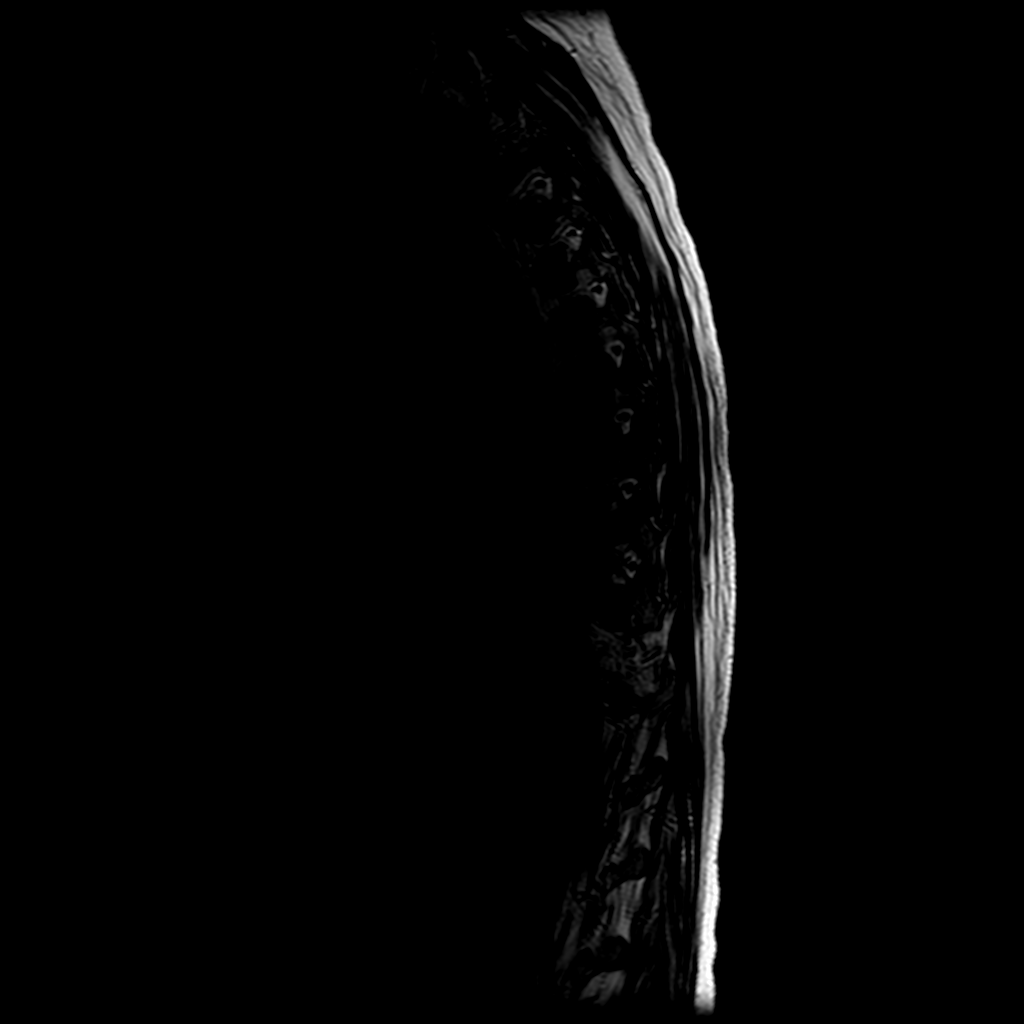
[im 7/12]
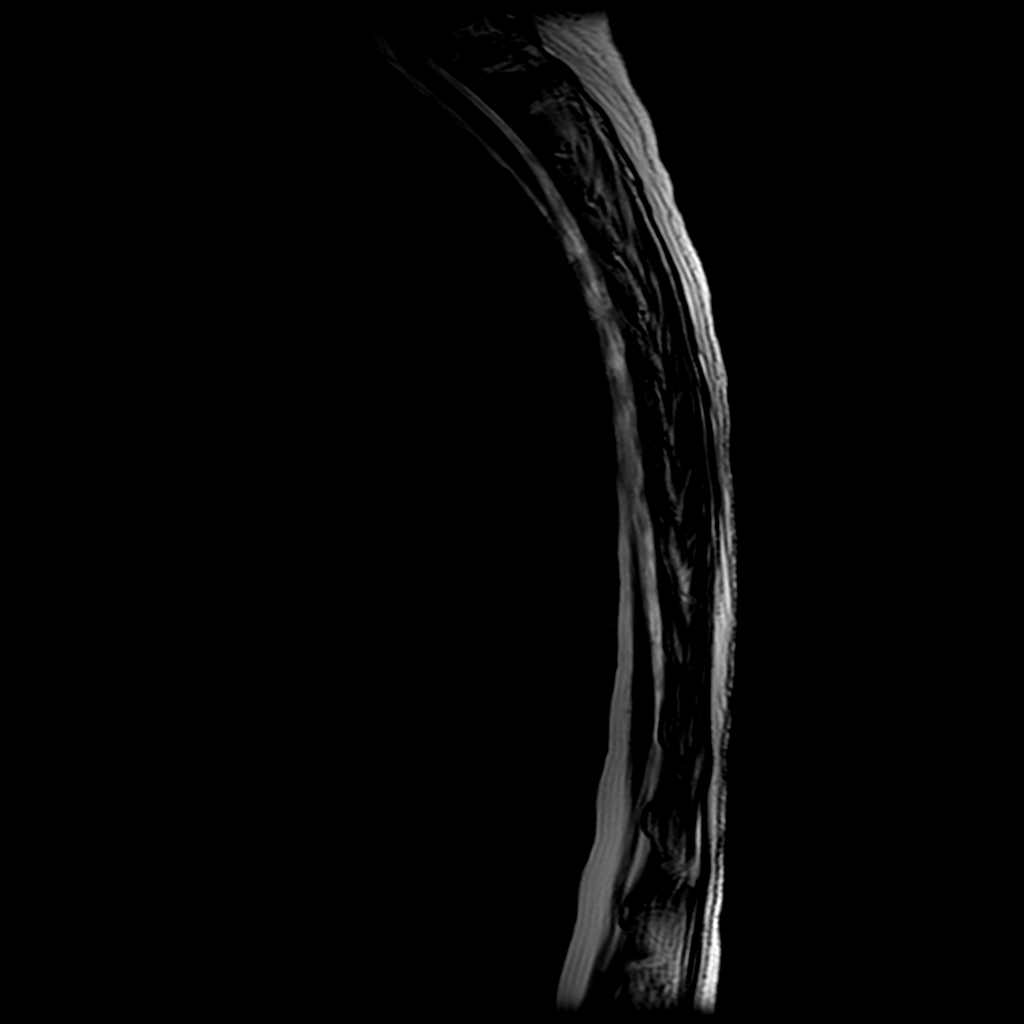
[im 12/12]
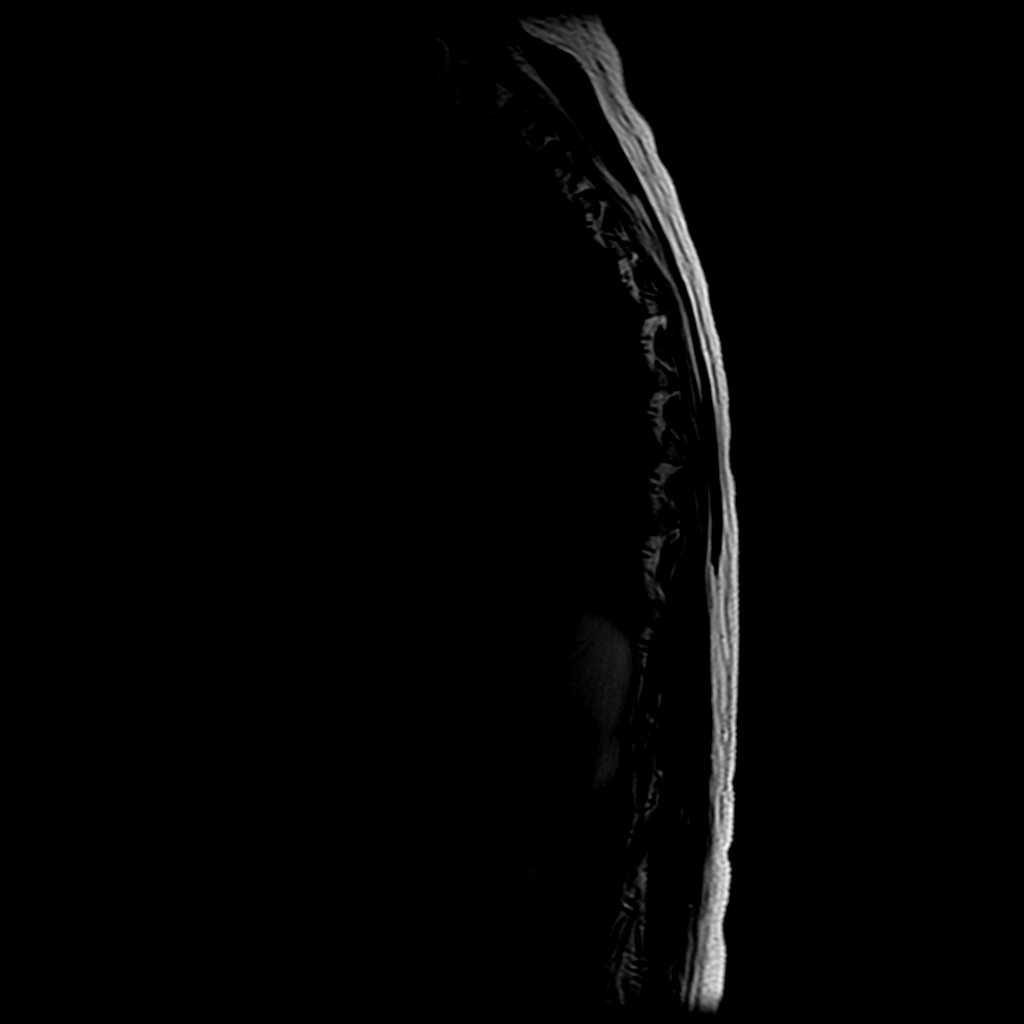

[Series 6: T1 · sagittal · 4.0mm · 0.35mm/px · 1 of 12 slices shown]
[im 2/12]
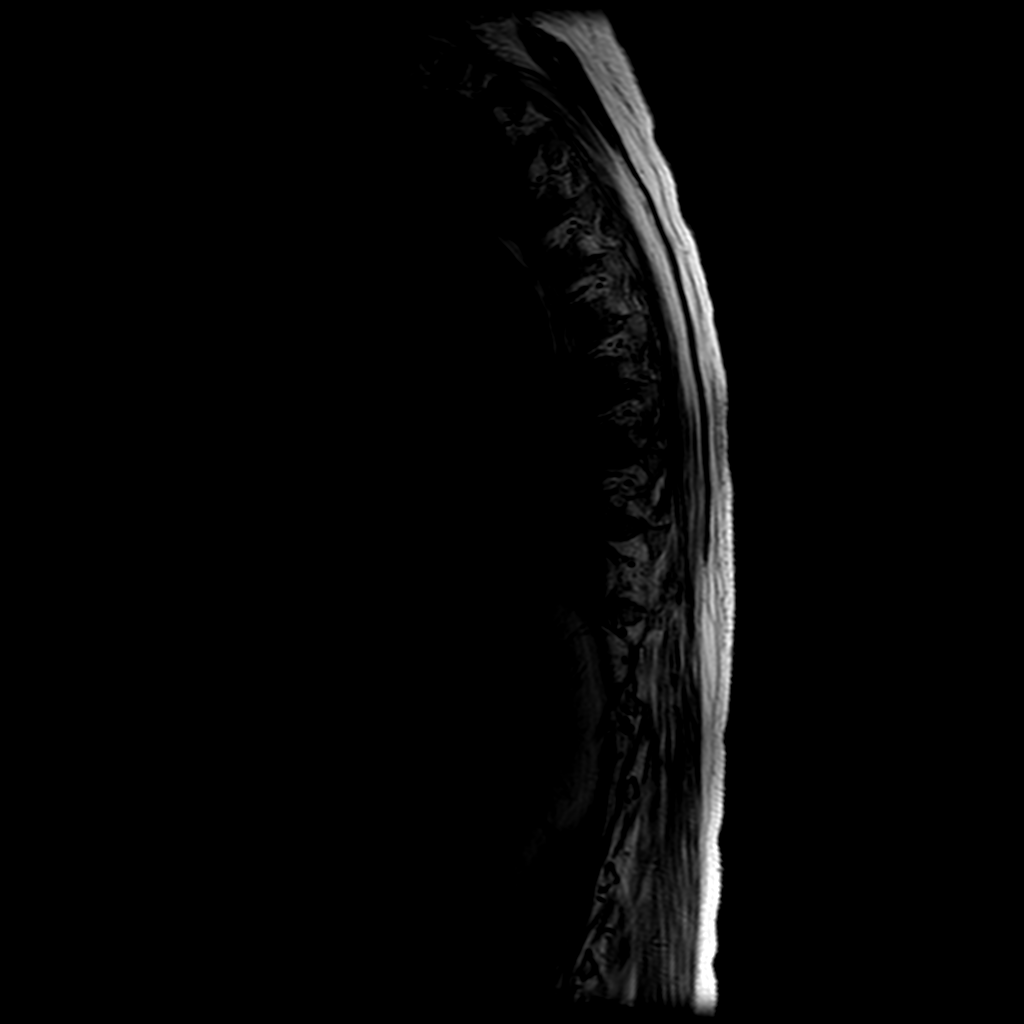

[Series 9: T2 · axial · 3.0mm · 0.39mm/px · z∈[-204,-82]mm · 3 of 12 slices shown (2 of 2)]
[im 2/12]
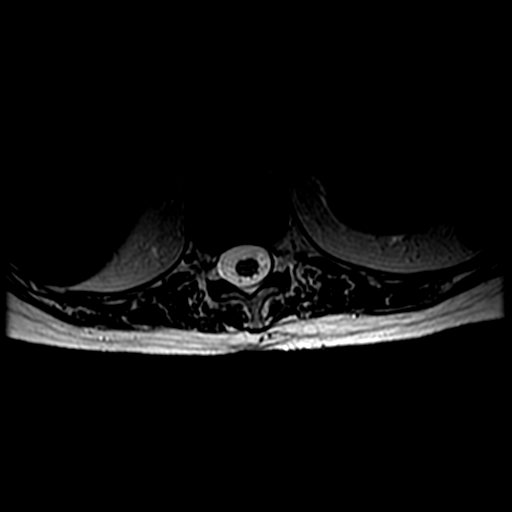
[im 6/12]
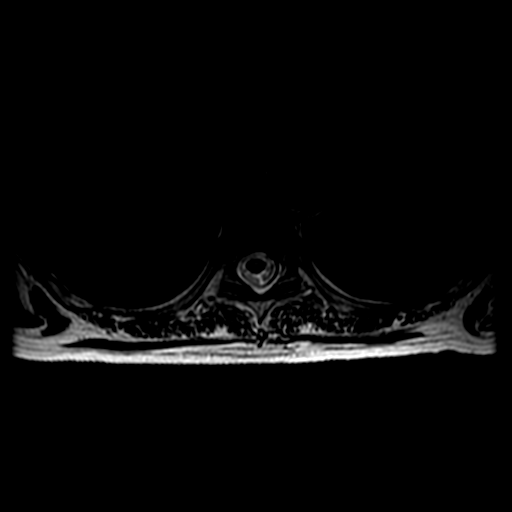
[im 10/12]
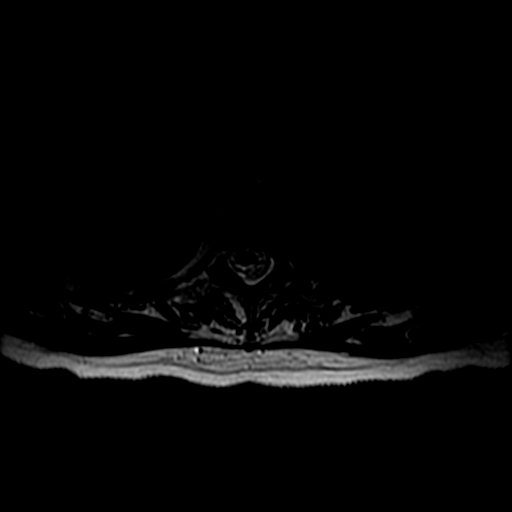

[7 of 48 positions shown; findings below may reference images not displayed]

FINDINGS: Limited sagittal imaging of the cervical spine
demonstrates exaggerated cervical lordosis.  Mild disc degeneration
at C5-C6, otherwise no significant spinal or neural foraminal
stenosis suspected.  Cervical spinal cord and cervicomedullary
junction appear within normal limits.  Cervicothoracic junction
alignment is normal.

Visualized paraspinal soft tissues, thoracic and abdominal viscera
are within normal limits.  Thoracic spinal canal is widely patent
at all levels.  Thoracic spinal cord signal and morphology is
normal at all levels.  The conus medularis is located at T12-L1.
No neural foraminal stenosis.  There is mildly exaggerated upper
thoracic kyphosis.  Otherwise thoracic and visualized upper lumbar
vertebrae demonstrate normal height, alignment and marrow signal.
Incidental benign hemangiomas at T8, L1 and L2.  No marrow edema to
suggest acute osseous abnormality.  Intervertebral disc spaces are
within normal limits.
IMPRESSION: Normal thoracic spine for age.

## 2010-02-18 ENCOUNTER — Ambulatory Visit: Payer: Self-pay | Admitting: Gastroenterology

## 2010-02-18 DIAGNOSIS — K7689 Other specified diseases of liver: Secondary | ICD-10-CM | POA: Insufficient documentation

## 2010-02-19 ENCOUNTER — Telehealth: Payer: Self-pay | Admitting: Pulmonary Disease

## 2010-02-19 ENCOUNTER — Ambulatory Visit: Payer: Self-pay | Admitting: Internal Medicine

## 2010-02-20 ENCOUNTER — Telehealth: Payer: Self-pay | Admitting: Gastroenterology

## 2010-02-21 ENCOUNTER — Ambulatory Visit: Payer: Self-pay | Admitting: Gastroenterology

## 2010-03-02 IMAGING — CT CT L SPINE W/ CM
3 of 7 series · 8 of 20 positions shown, 9 images · non-contrast
Comparison: none

CLINICAL DATA: She has severe low back pain lumbosacral region.
This is accentuated following a recent fall.

[Series 2: l-spine helical · axial · 0.27mm/px · z∈[-42,+26]mm · 4 of 47 slices shown, 5 images]
[im 10/47  soft-tissue]
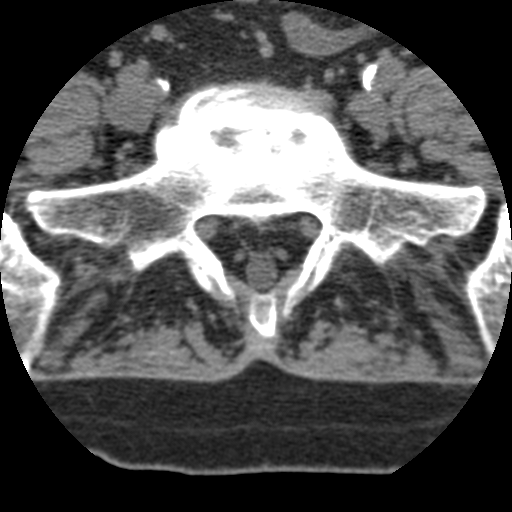
[im 10/47  bone]
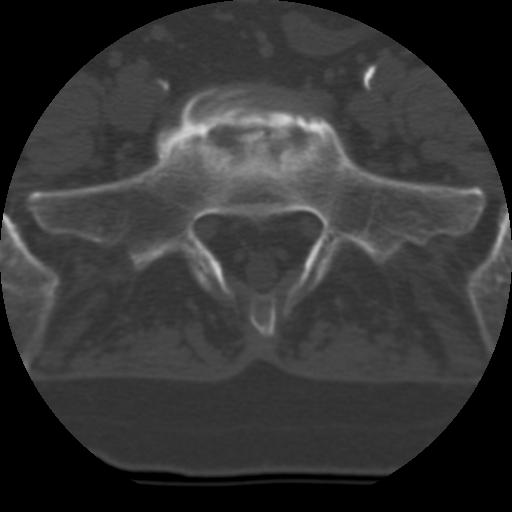
[im 19/47  bone]
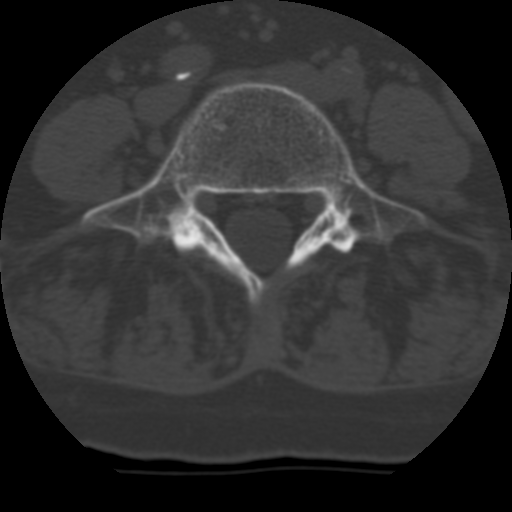
[im 28/47  bone]
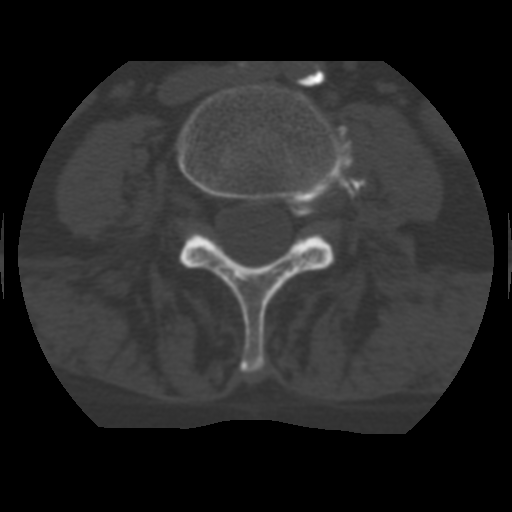
[im 37/47  bone]
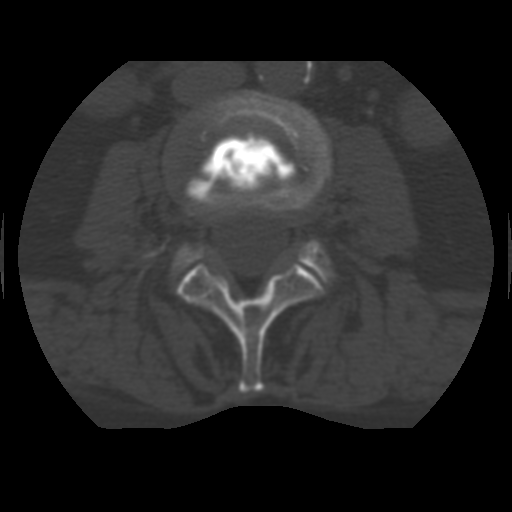

[Series 3: bone windows · axial · 0.27mm/px · z∈[-36,+21]mm · 3 of 47 slices shown]
[im 12/47  bone]
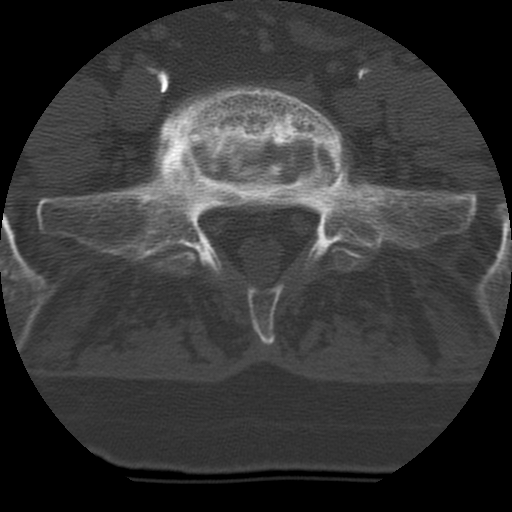
[im 24/47  bone]
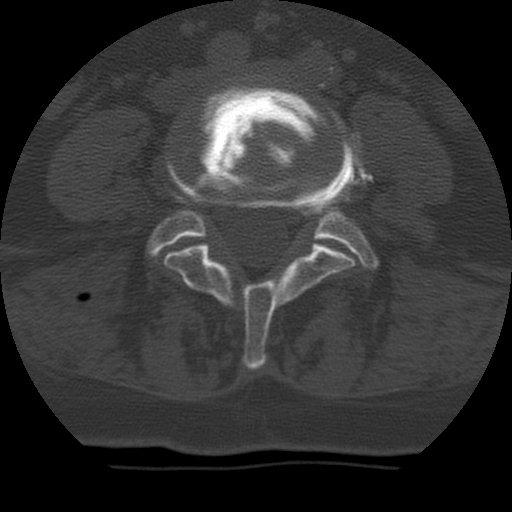
[im 35/47  bone]
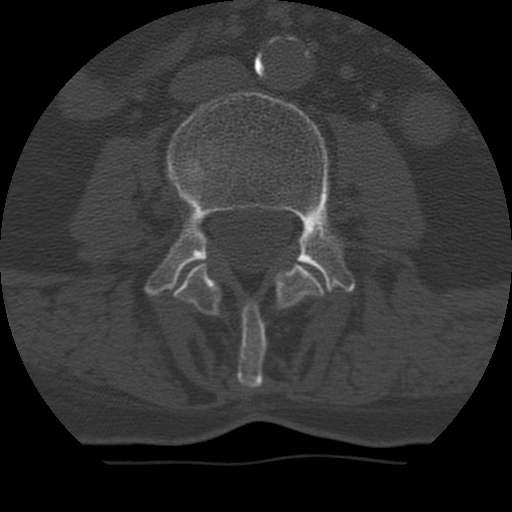

[Series 400: sagittal · sagittal · 0.27mm/px · 1 of 40 slices shown]
[im 20/40  bone]
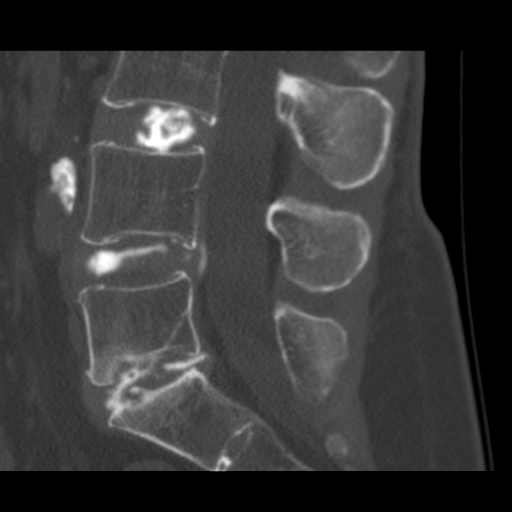

[8 of 20 positions shown; findings below may reference images not displayed]

Fluoroscopy Time: 4.54 minutes

DIAGNOSTIC LUMBAR DISK INJECTIONS

The procedure was discussed in depth with the patient including the
potential risk of infection. She received 1 gram of Ancef
intravenously prior to the procedure with a small amount withdrawn
and added to the contrast for injection.  She received 2 mg of
Versed intravenously prior to the procedure.  She was placed prone
on the fluoroscopic table.  A Betadine scrub of the low back was
performed and the patient was draped in a sterile fashion.  Skin
anesthesia was carried [DATE]% Lidocaine. 15 cm 22 gauge Noel
Klever were directed into the nuclear regions of the disks at L3-
4, L4-5, and L5-S1.  Omnipaque 180 mixed with Ancef was injected
using a pressure monitoring syringe.  Spot radiographs were taken.
The patient's symptoms were recorded.  Following the procedure, she
was treated with intravenous Toradol and Fentanyl.  She the was
taken to CT scan in good condition.  She was observed for an hour
after the CT scan and discharged in good condition.

L3-L4:  Opening pressure 20, closing pressure 180 with a firm end
point elicited.  There is no pain response noted.  Contrast
collects centrally within the nucleus.

L4-L5:  Opening pressure 20, an ending pressure of 80. No end point
was noted.  The injection does provoke discomfort into her right
hip however, the pain response is indeterminate.

L5-S1:  Opening pressure less than five.  Closing pressure
approximately 20.  During injection of contrast, we provoke intense
pain concordant with that which she has on a regular basis which
she scores as [DATE].
IMPRESSION: A concordant pain response is elicited during injection of the L5-
S1 disc.  The disc is chemically sensitive.

Three - L4 disc is normal.

L4-L5 discs morphologically abnormal.  There is no concordant pain
response elicited during injection of the [DATE] disc.

CT lumbar post intradiskal contrast injection:

The spine is anatomic.  Annular tears are appreciated at the L4-L5
and L5-S1 level.  Marked disc height loss of [DATE] with reactive
endplate changes noted.

Axial imaging:  L3-L4:  Normal

L4-L5:  Disc bulge left with broad annular tear extending from the
3 o'clock to the 5 o'clock position.  There is narrowing of the
left L4 foramen without encroachment on the L4 root.  No central
stenosis.

L5-S1:  Diffuse disc bulge with broad annular tear.  On the right,
this extends from the 11 o'clock to the 7 o'clock position and on
the left, the annular tear extends from the 2 o'clock to the 4
o'clock position.  There is multifactorial narrowing of the right
L5 foramen.  No definite encroachment on the L5 root.  No
appreciable central stenosis.  Facet degenerative changes
bilaterally.
IMPRESSION: diffuse degenerative changes as described above.

## 2010-04-02 ENCOUNTER — Ambulatory Visit: Payer: Self-pay | Admitting: Pulmonary Disease

## 2010-04-21 IMAGING — CR DG CHEST 2V
2 series · 2 of 2 positions shown · non-contrast
Comparison: Chest x-ray of 03/06/2008

CLINICAL DATA: Short of breath, chest pain, cough, former smoker

CHEST - 2 VIEW

[view not recorded (1 of 2)]
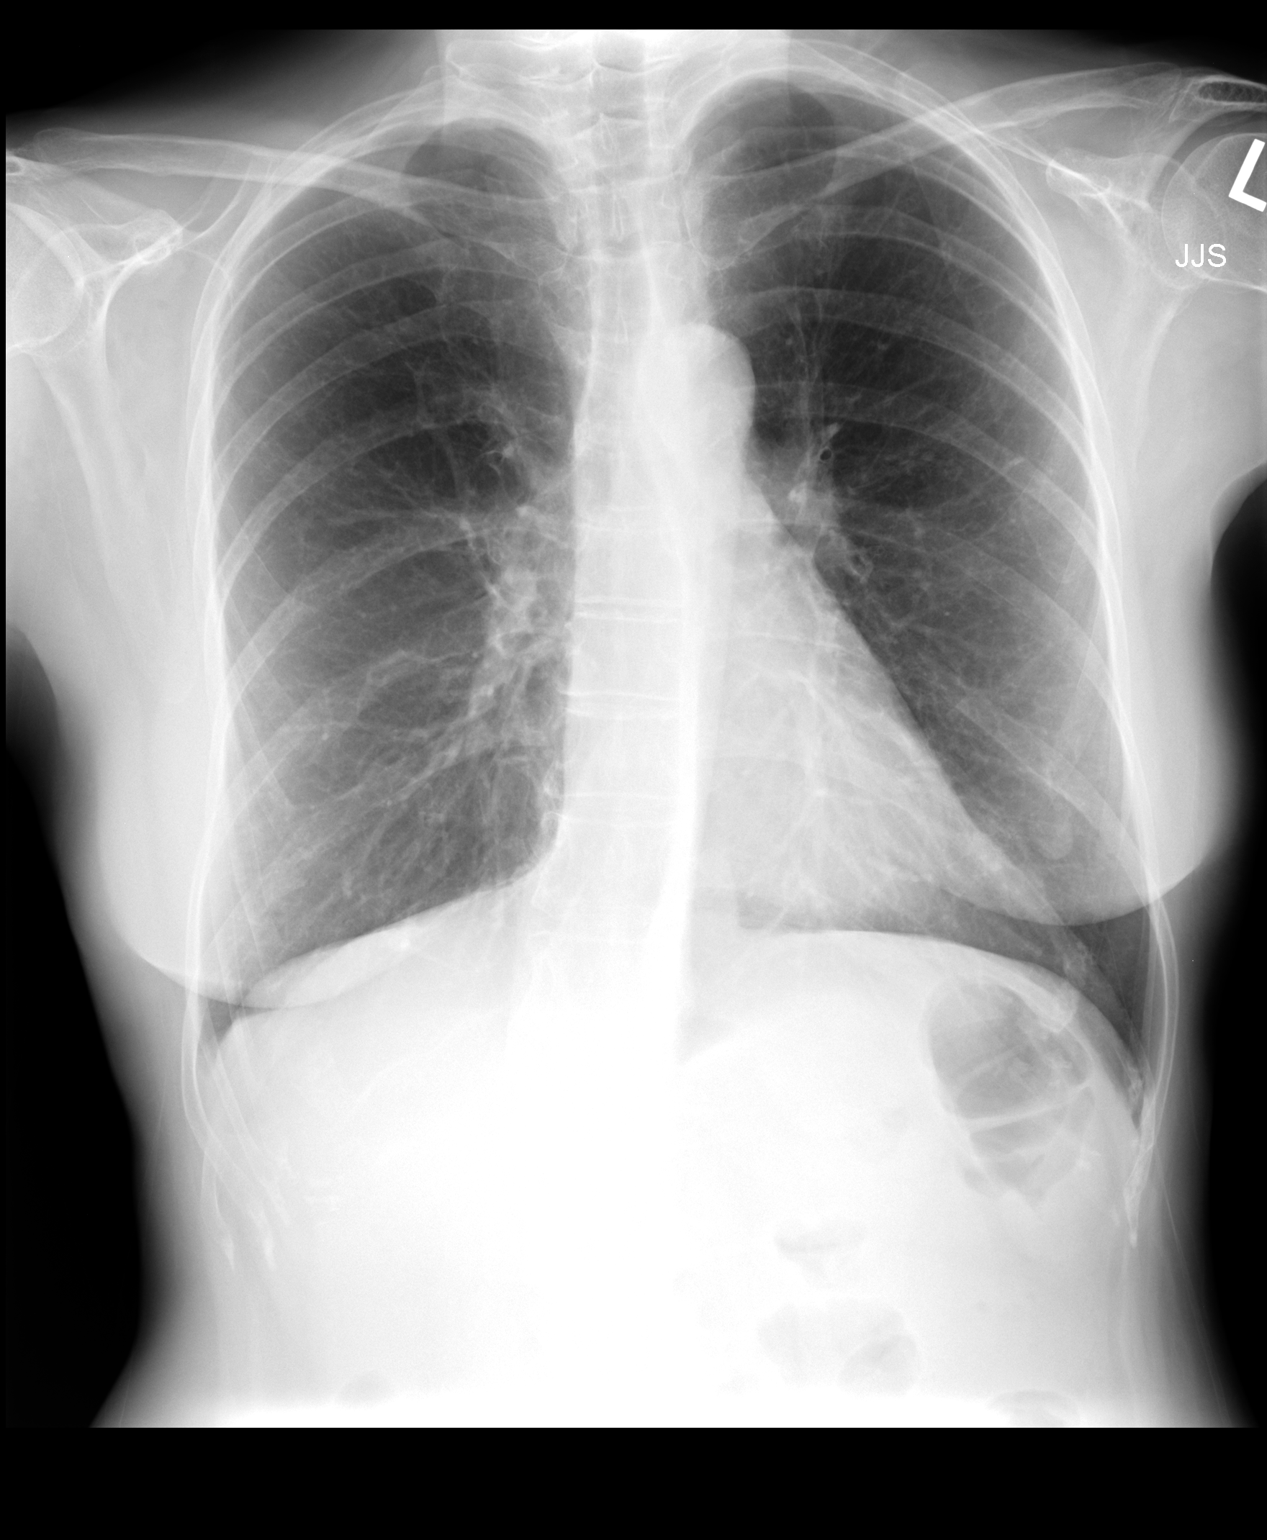

[view not recorded (2 of 2)]
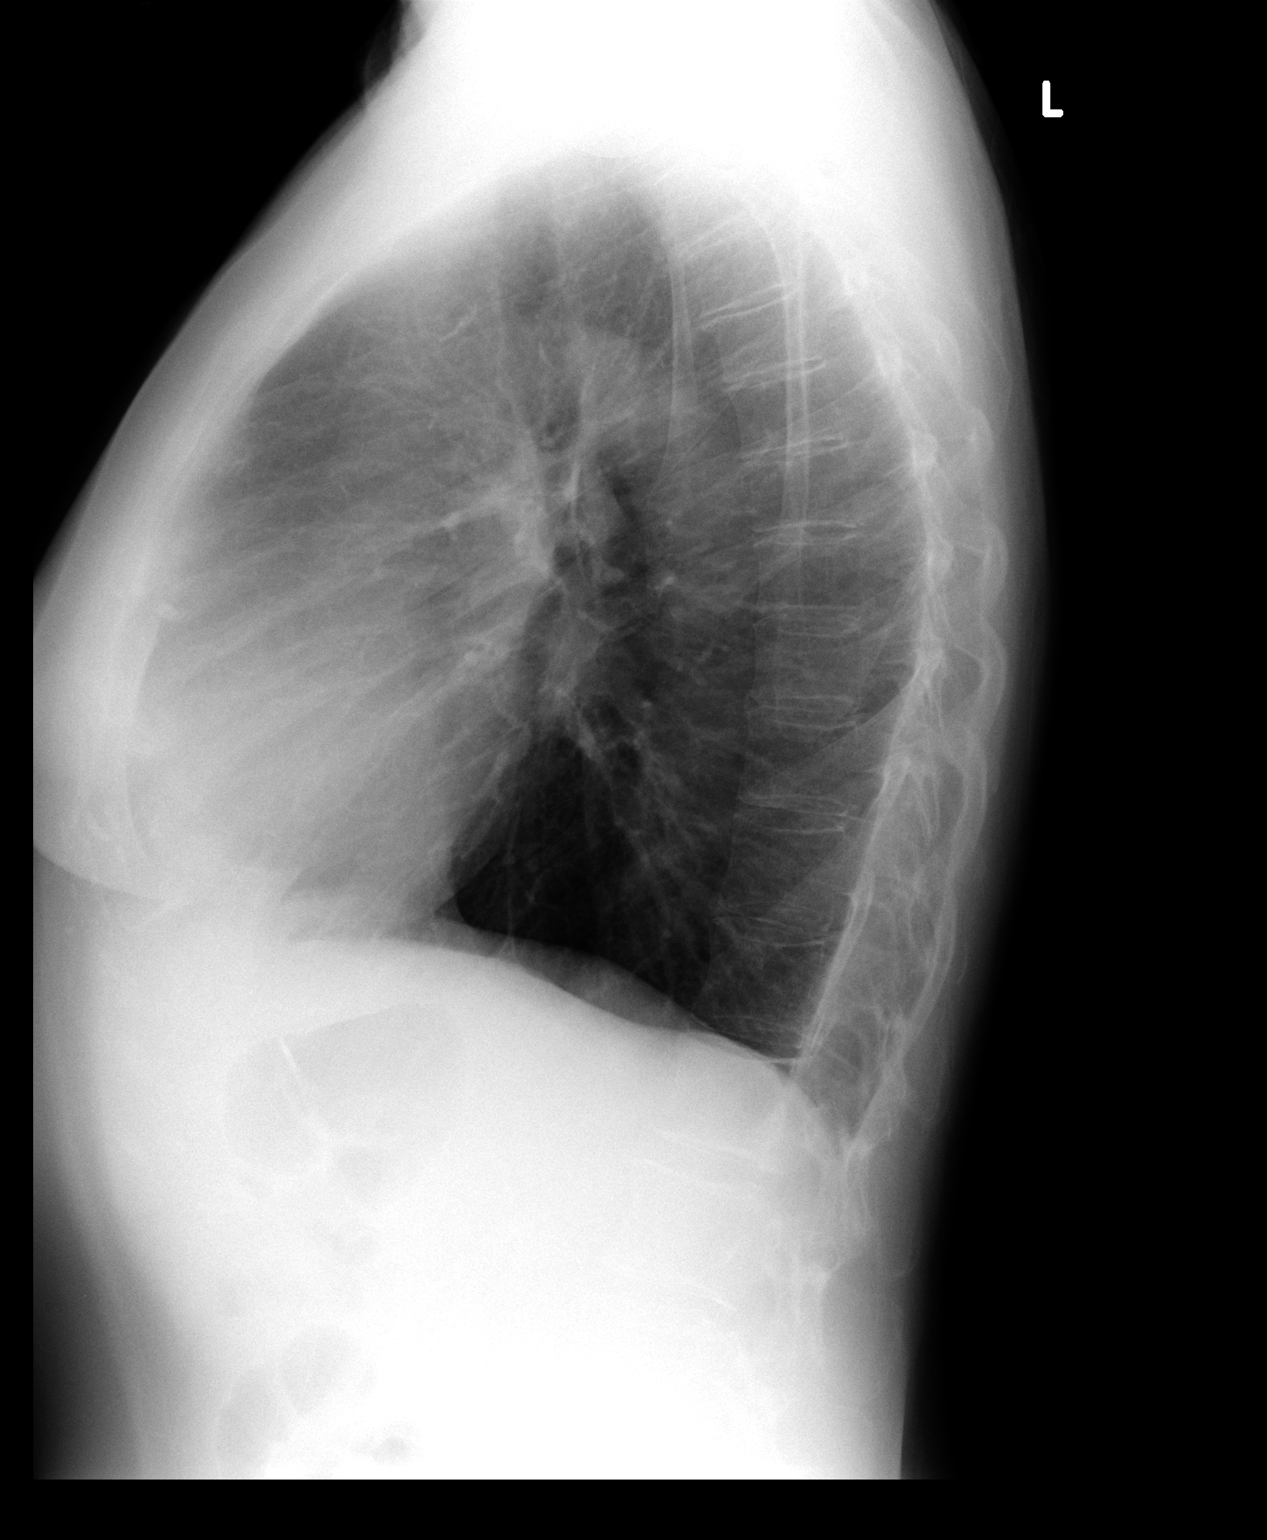

[2 of 2 positions shown; findings below may reference images not displayed]

FINDINGS: The lungs are clear and hyperaerated consistent with
COPD.  No infiltrate or effusion is seen.  Heart is within normal
limits in size.  No bony abnormality is noted.
IMPRESSION: COPD.  No active lung disease.

## 2010-05-19 ENCOUNTER — Telehealth: Payer: Self-pay | Admitting: Pulmonary Disease

## 2010-07-16 ENCOUNTER — Emergency Department (HOSPITAL_COMMUNITY): Admission: EM | Admit: 2010-07-16 | Discharge: 2010-07-16 | Payer: Self-pay | Admitting: Emergency Medicine

## 2010-08-06 ENCOUNTER — Telehealth: Payer: Self-pay | Admitting: Pulmonary Disease

## 2010-08-07 IMAGING — CT CT L SPINE W/O CM
4 of 10 series · 12 of 33 positions shown, 14 images · non-contrast
Comparison: Discography CT 06/11/2008.

CLINICAL DATA: Low back pain.  Right buttock and thigh pain.
Surgery 8 weeks ago.

CT LUMBAR SPINE WITHOUT CONTRAST
TECHNIQUE: Multidetector CT imaging of the lumbar spine was
performed without intravenous contrast administration. Multiplanar
CT image reconstructions were also generated.

[Series 2: l spine · axial · 0.27mm/px · z∈[-128,-56]mm · 2 of 87 slices shown, 3 images]
[im 29/87  soft-tissue]
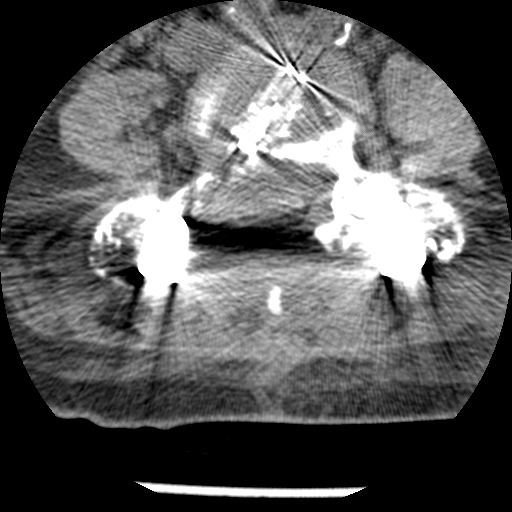
[im 29/87  bone]
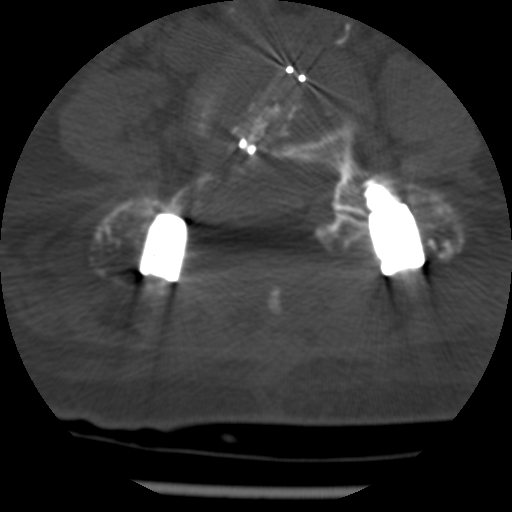
[im 58/87  bone]
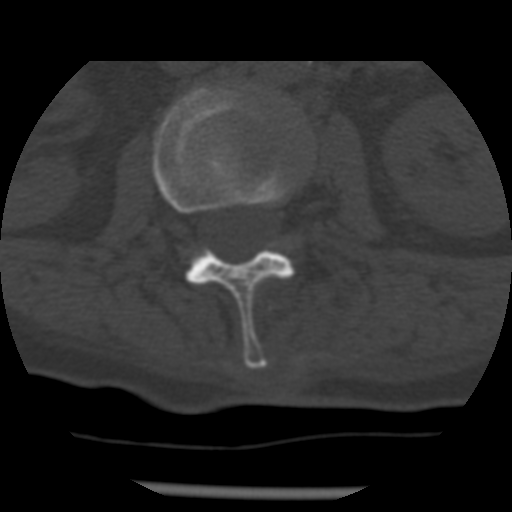

[Series 3: bone windows · axial · 0.27mm/px · z∈[-128,-56]mm · 2 of 87 slices shown]
[im 29/87  bone]
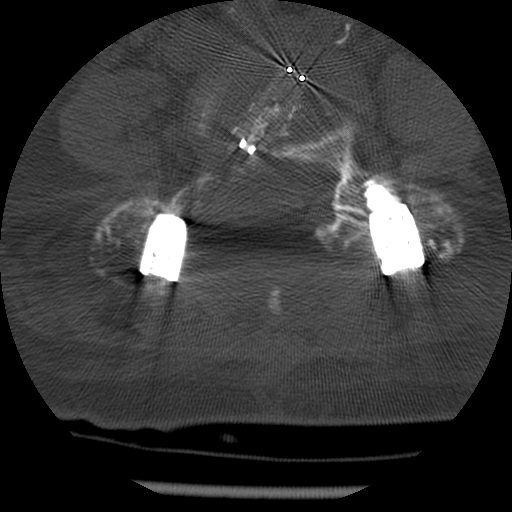
[im 58/87  bone]
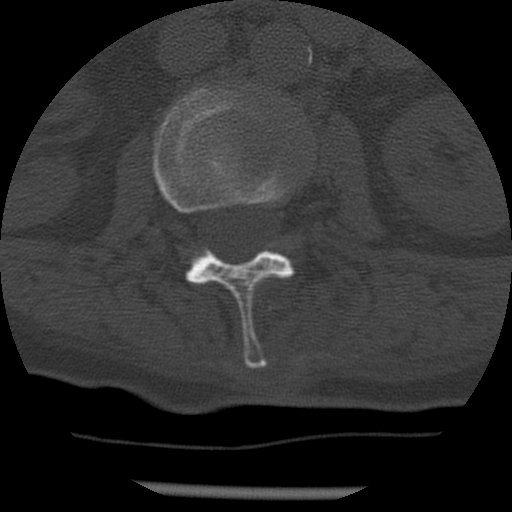

[Series 400: cor l-spine · coronal · 0.43mm/px · 3 of 51 slices shown]
[im 11/51  bone]
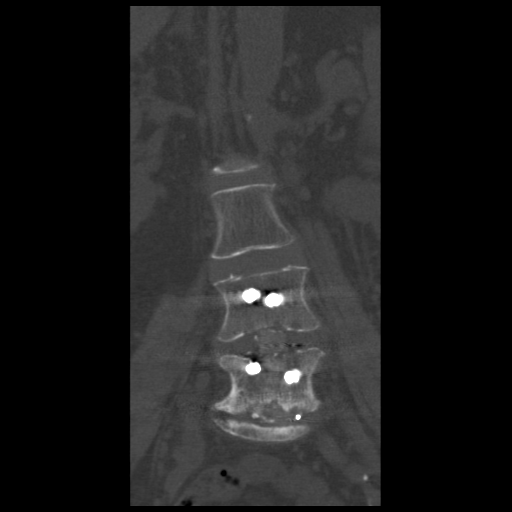
[im 21/51  bone]
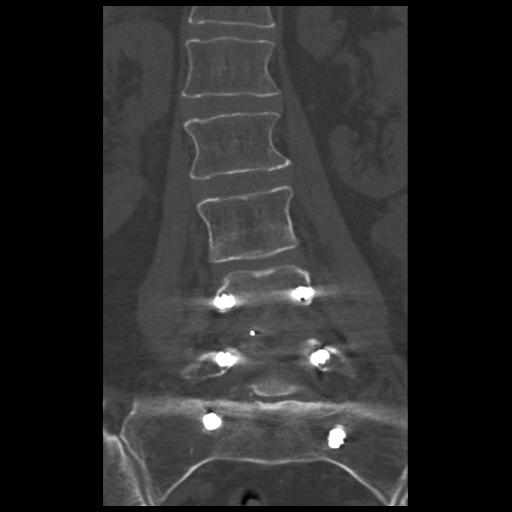
[im 31/51  bone]
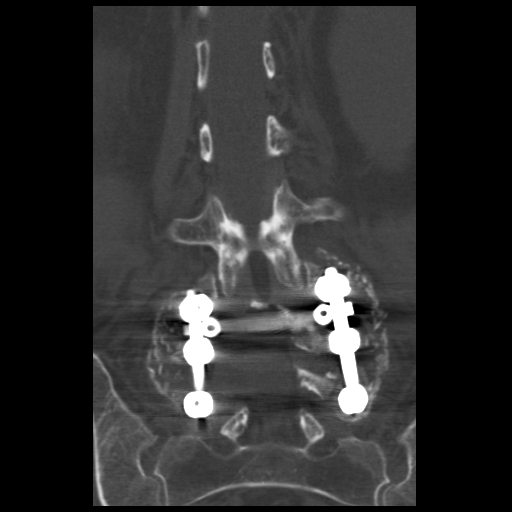

[Series 401: sag l-spine · sagittal · 0.43mm/px · 5 of 54 slices shown, 6 images]
[im 18/54  bone]
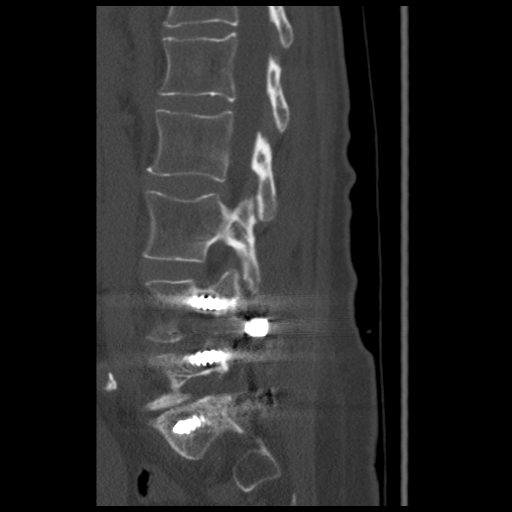
[im 23/54  bone]
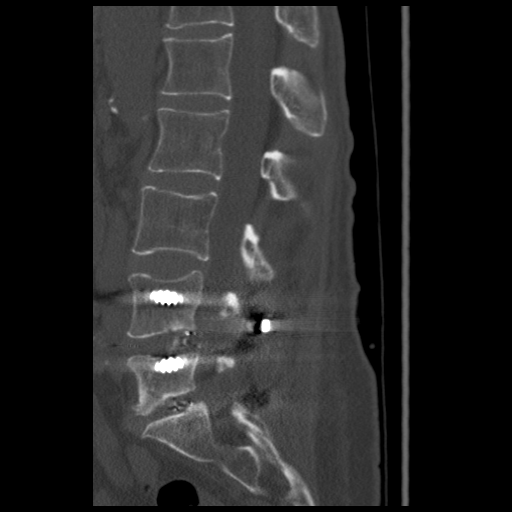
[im 27/54  soft-tissue]
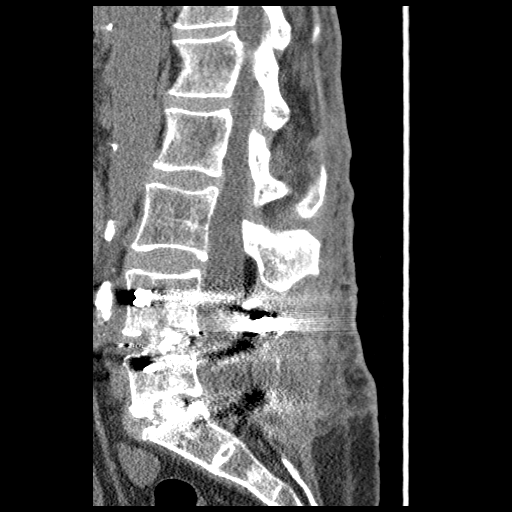
[im 27/54  bone]
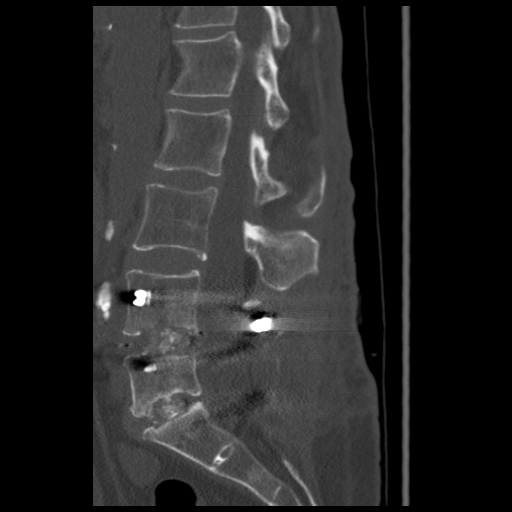
[im 31/54  bone]
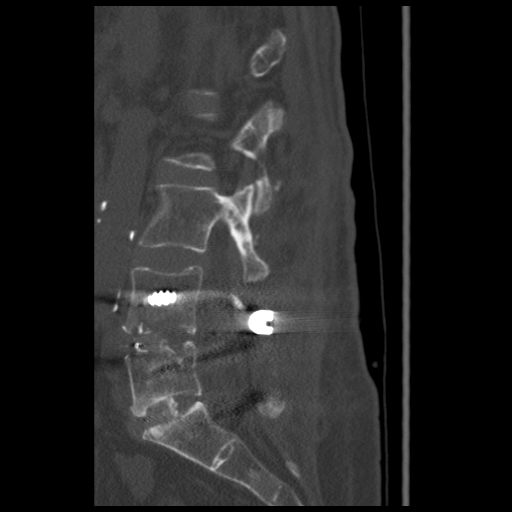
[im 36/54  bone]
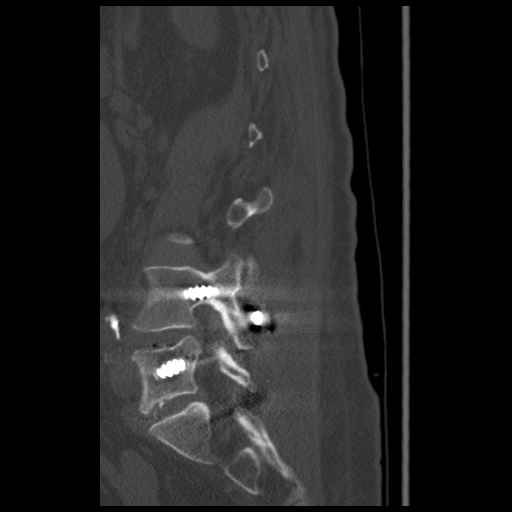

[12 of 33 positions shown; findings below may reference images not displayed]

FINDINGS: T12-L1:  Normal

L1-2:  Normal

L2-3:  Normal

L3-4:  Minimal disc bulge.  No significant pathology suspected.

L4 and S1:  Previous posterior decompression and fusion.  Pedicle
screws are grossly well positioned.  Interbody fusion material
appears well positioned.  Spinal canal and foramina appear widely
patent.  I cannot establish that solid fusion has occurred at this
point.  There is no sign of screw loosening or nitrogen gas to
suggest further motion however.
IMPRESSION: No significant finding above the fusion segment.

Grossly satisfactory appearance of the fusion segment from L4, S1.
I cannot establish that definite solid fusion has occurred at this
point, but there are no complicating features evident.  See above
discussion.

## 2010-09-02 ENCOUNTER — Telehealth: Payer: Self-pay | Admitting: Gastroenterology

## 2010-09-04 ENCOUNTER — Ambulatory Visit: Payer: Self-pay | Admitting: Gastroenterology

## 2010-09-04 ENCOUNTER — Telehealth: Payer: Self-pay | Admitting: Gastroenterology

## 2010-09-05 ENCOUNTER — Telehealth: Payer: Self-pay | Admitting: Physician Assistant

## 2010-09-08 ENCOUNTER — Telehealth: Payer: Self-pay | Admitting: Physician Assistant

## 2010-09-08 LAB — CONVERTED CEMR LAB
BUN: 6 mg/dL (ref 6–23)
Basophils Absolute: 0.1 10*3/uL (ref 0.0–0.1)
Basophils Relative: 0.6 % (ref 0.0–3.0)
Bilirubin Urine: NEGATIVE
CO2: 32 meq/L (ref 19–32)
CRP, High Sensitivity: 4.97 (ref 0.00–5.00)
Calcium: 9.5 mg/dL (ref 8.4–10.5)
Chloride: 103 meq/L (ref 96–112)
Creatinine, Ser: 0.7 mg/dL (ref 0.4–1.2)
Eosinophils Absolute: 0.1 10*3/uL (ref 0.0–0.7)
Eosinophils Relative: 1 % (ref 0.0–5.0)
GFR calc non Af Amer: 99.75 mL/min (ref >60)
Glucose, Bld: 108 mg/dL — ABNORMAL HIGH (ref 70–99)
HCT: 41 % (ref 36.0–46.0)
Hemoglobin: 14.5 g/dL (ref 12.0–15.0)
Ketones, ur: NEGATIVE mg/dL
Leukocytes, UA: NEGATIVE
Lymphocytes Relative: 46.3 % — ABNORMAL HIGH (ref 12.0–46.0)
Lymphs Abs: 5.4 10*3/uL — ABNORMAL HIGH (ref 0.7–4.0)
MCHC: 35.4 g/dL (ref 30.0–36.0)
MCV: 99.7 fL (ref 78.0–100.0)
Monocytes Absolute: 0.9 10*3/uL (ref 0.1–1.0)
Monocytes Relative: 7.8 % (ref 3.0–12.0)
Neutro Abs: 5.2 10*3/uL (ref 1.4–7.7)
Neutrophils Relative %: 44.3 % (ref 43.0–77.0)
Nitrite: NEGATIVE
Platelets: 295 10*3/uL (ref 150.0–400.0)
Potassium: 4.4 meq/L (ref 3.5–5.1)
RBC: 4.11 M/uL (ref 3.87–5.11)
RDW: 13.3 % (ref 11.5–14.6)
Sodium: 144 meq/L (ref 135–145)
Specific Gravity, Urine: <=1.005 (ref 1.000–1.030)
Total Protein, Urine: NEGATIVE mg/dL
Urine Glucose: NEGATIVE mg/dL
Urobilinogen, UA: 0.2 (ref 0.0–1.0)
WBC: 11.7 10*3/uL — ABNORMAL HIGH (ref 4.5–10.5)
pH: 6 (ref 5.0–8.0)

## 2010-09-10 ENCOUNTER — Ambulatory Visit: Payer: Self-pay | Admitting: Gastroenterology

## 2010-10-02 IMAGING — CT CT PELVIS W/ CM
2 of 5 series · 16 of 46 positions shown, 18 images · IV contrast (Omnipaque 300)
Comparison: 10/04/2006

CT ABDOMEN

CLINICAL DATA: Abdominal pain.  Weight loss and loss of appetite.

CT ABDOMEN AND PELVIS WITH CONTRAST
TECHNIQUE: Multidetector CT imaging of the abdomen and pelvis was
performed using the standard protocol following bolus
administration of intravenous contrast.
Contrast: 90 ml Emnipaque-UGG.

[Series 2: abd_pel 5.0 b30f st · axial · 0.65mm/px · z∈[-428,-74]mm · 13 of 81 slices shown, 15 images]
[im 5/81  soft-tissue]
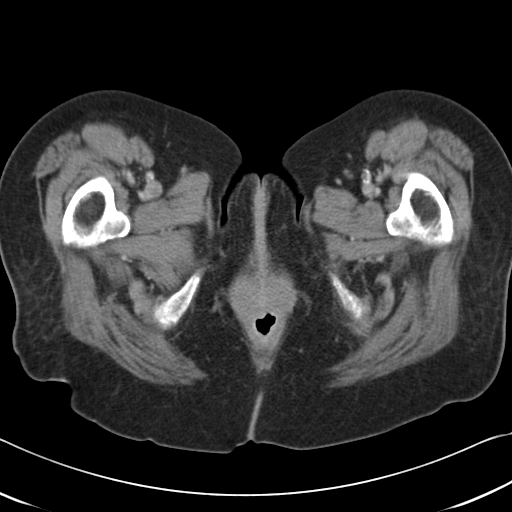
[im 5/81  bone]
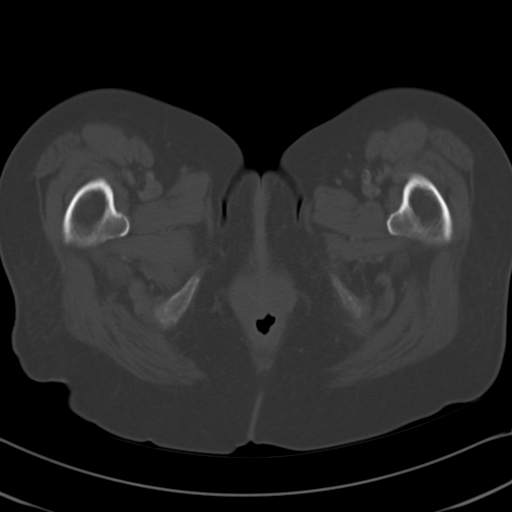
[im 10/81  soft-tissue]
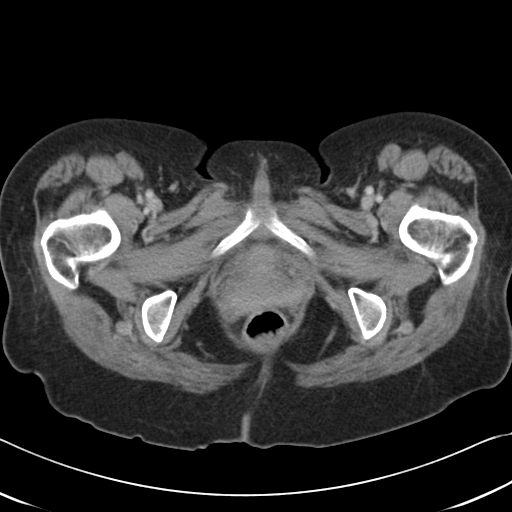
[im 19/81  soft-tissue]
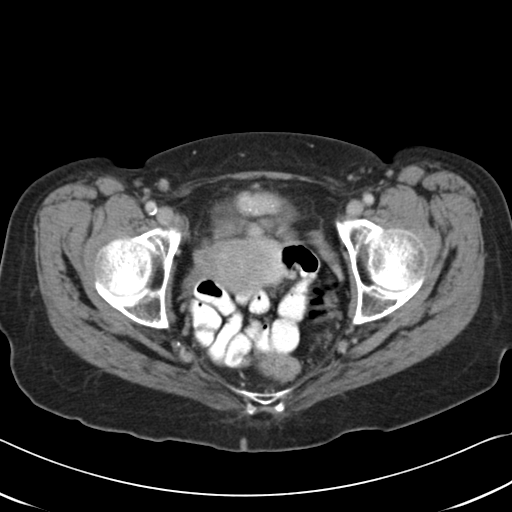
[im 24/81  soft-tissue]
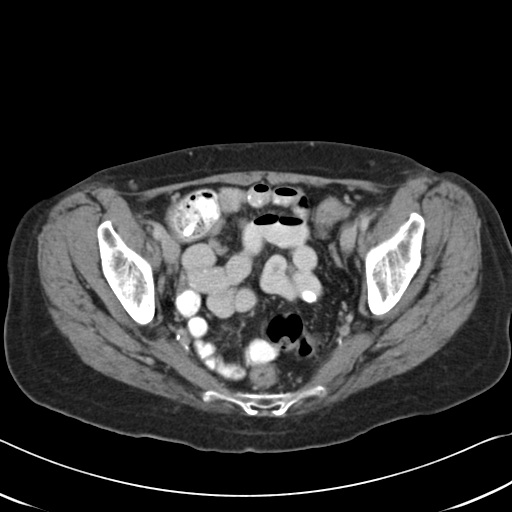
[im 29/81  soft-tissue]
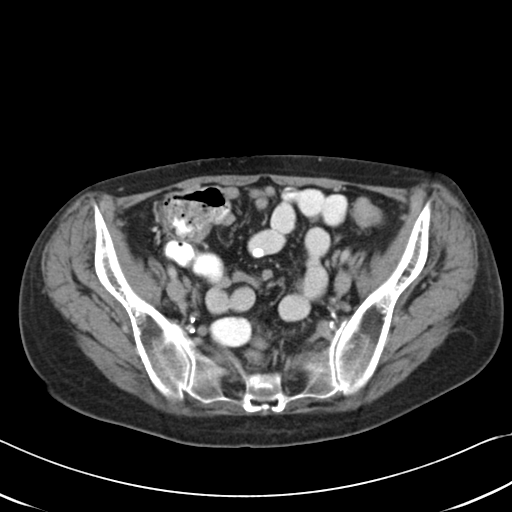
[im 33/81  soft-tissue]
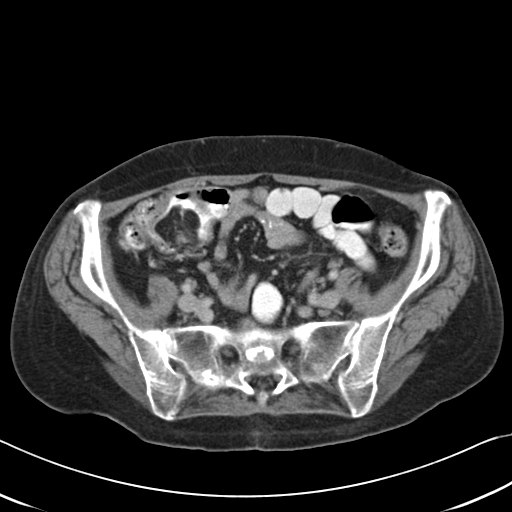
[im 43/81  soft-tissue]
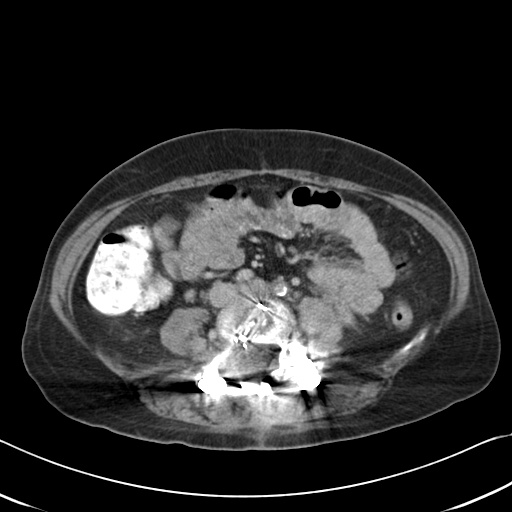
[im 48/81  soft-tissue]
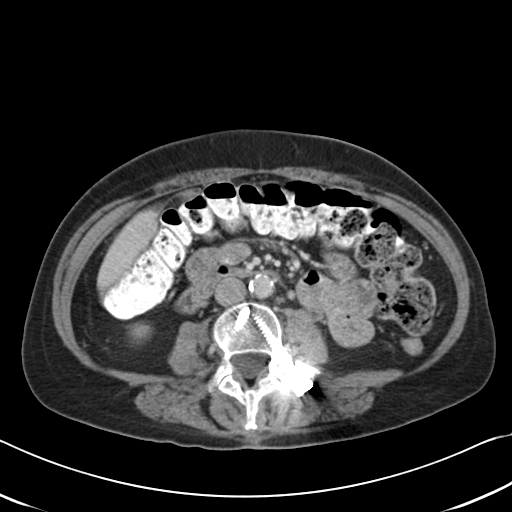
[im 52/81  soft-tissue]
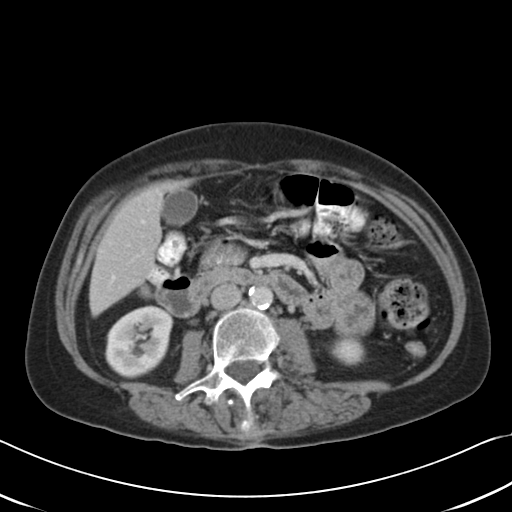
[im 52/81  bone]
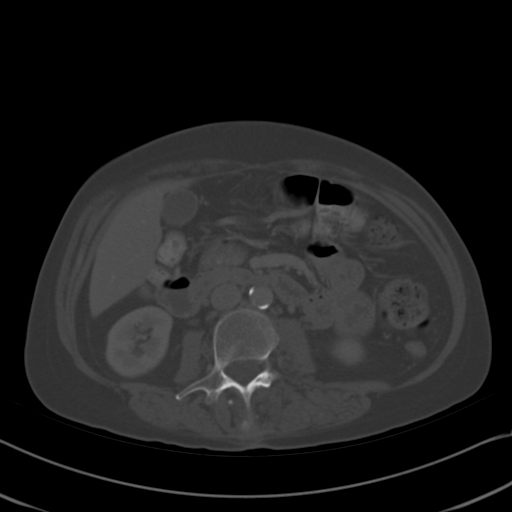
[im 57/81  soft-tissue]
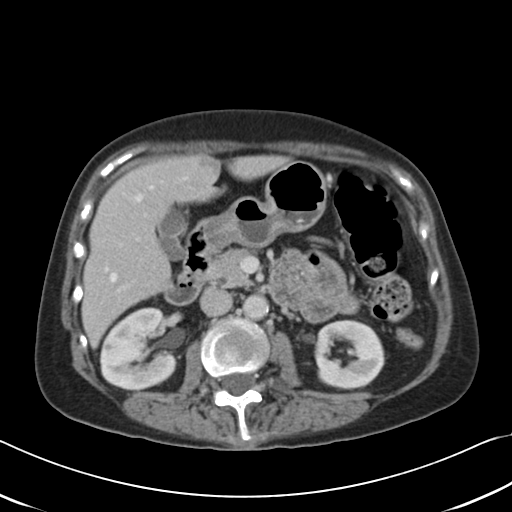
[im 62/81  soft-tissue]
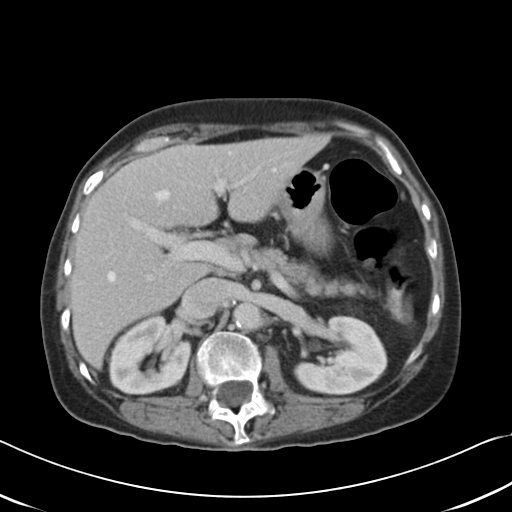
[im 71/81  soft-tissue]
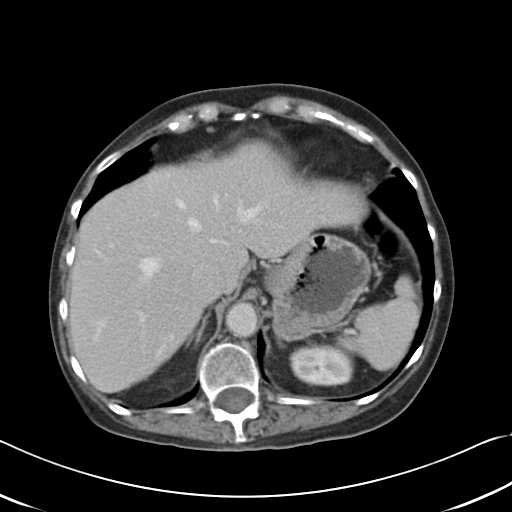
[im 76/81  soft-tissue]
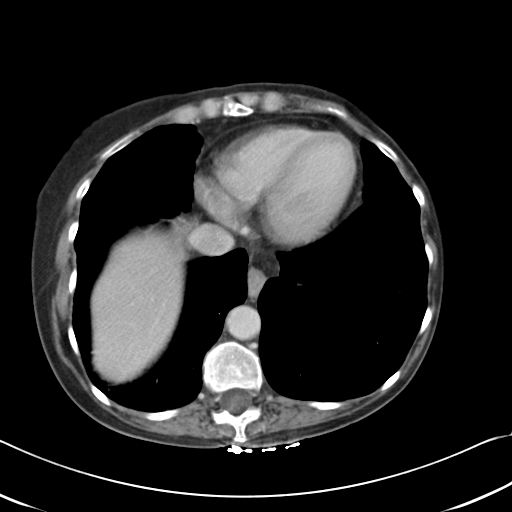

[Series 602: <mpr thick range> · coronal · 0.80mm/px · 3 of 76 slices shown]
[im 26/76  soft-tissue]
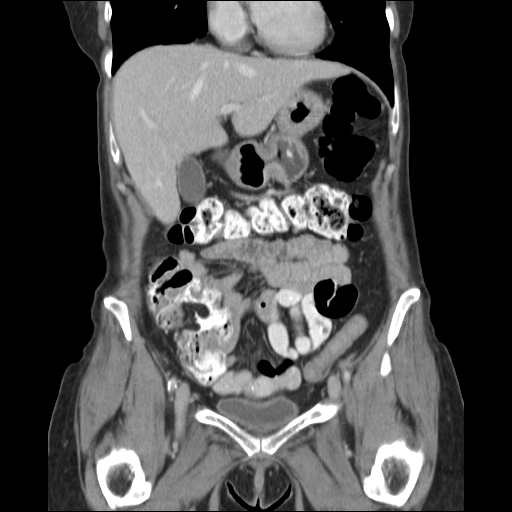
[im 34/76  soft-tissue]
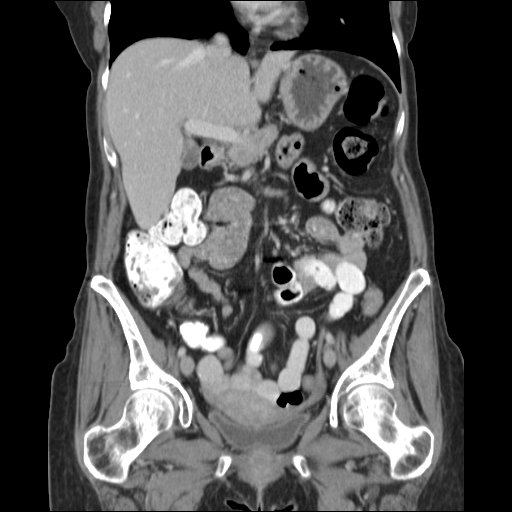
[im 42/76  soft-tissue]
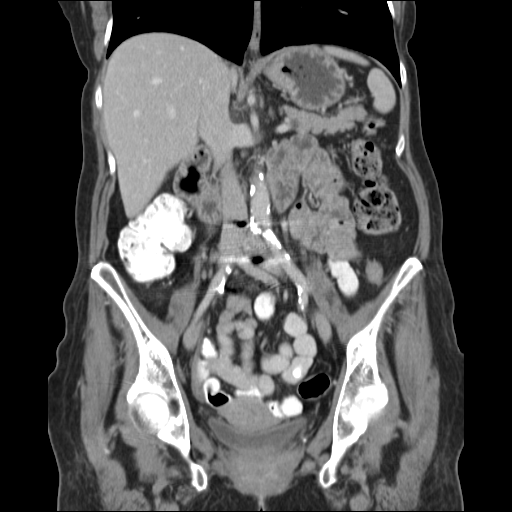

[16 of 46 positions shown; findings below may reference images not displayed]

FINDINGS: The lung bases are clear.  The heart is normal in size.
No pericardial effusion.  The distal esophagus is grossly normal.

There is a stable low attenuation lesion in the left hepatic lobe
which is likely a benign cyst.  No biliary dilatation.  The
gallbladder appears normal.  The spleen is normal in size.  The
pancreas is unremarkable. The adrenal glands and kidneys
demonstrate no significant findings.  Small renal cysts are noted.

The stomach, duodenum, small bowel and colon demonstrate no
significant findings.  There is moderate stool throughout the right
and transverse colon.  No colonic mass lesion or inflammatory
change.

The aorta is normal in caliber.  Stable atherosclerotic changes.
The major branch vessels are intact.  No mesenteric or
retroperitoneal masses or adenopathy.  A small scattered nodes are
noted.

The bony structures are unremarkable.  Surgical changes are noted
in the lumbar spine from a lumbar fusion.
IMPRESSION: 1.  Stable small low attenuation lesion in the left hepatic lobe is
likely a benign cyst.
2.  Small bilateral renal cysts.
3.  Moderate stool in the right and transverse colon.
4.  No acute abdominal findings, mass lesions or adenopathy.
5.  Stable atherosclerotic changes involving the aorta.
6.  Surgical changes from a lumbar fusion L4-S1.

CT PELVIS
FINDINGS: The rectum, sigmoid colon and visualized small bowel
loops unremarkable.  The terminal ileum appears normal.  The uterus
and ovaries are unremarkable.  Probable pill fragments in small
bowel loops.  The bladder appears normal.  No pelvic mass,
adenopathy or free pelvic fluid collections.  The bony pelvis is
intact.
IMPRESSION: No acute pelvic findings, mass lesions or adenopathy.

## 2010-10-03 ENCOUNTER — Telehealth: Payer: Self-pay | Admitting: Gastroenterology

## 2010-10-10 ENCOUNTER — Telehealth: Payer: Self-pay | Admitting: Gastroenterology

## 2010-10-27 ENCOUNTER — Ambulatory Visit (HOSPITAL_COMMUNITY): Admission: RE | Admit: 2010-10-27 | Discharge: 2010-10-27 | Payer: Self-pay | Admitting: Obstetrics and Gynecology

## 2010-11-13 ENCOUNTER — Telehealth: Payer: Self-pay | Admitting: Gastroenterology

## 2010-12-31 ENCOUNTER — Telehealth: Payer: Self-pay | Admitting: Gastroenterology

## 2011-01-01 ENCOUNTER — Encounter: Payer: Self-pay | Admitting: Gastroenterology

## 2011-01-01 ENCOUNTER — Ambulatory Visit
Admission: RE | Admit: 2011-01-01 | Discharge: 2011-01-01 | Payer: Self-pay | Source: Home / Self Care | Attending: Gastroenterology | Admitting: Gastroenterology

## 2011-01-07 ENCOUNTER — Ambulatory Visit
Admission: RE | Admit: 2011-01-07 | Discharge: 2011-01-07 | Payer: Self-pay | Source: Home / Self Care | Attending: Gastroenterology | Admitting: Gastroenterology

## 2011-01-07 ENCOUNTER — Encounter: Payer: Self-pay | Admitting: Gastroenterology

## 2011-01-08 ENCOUNTER — Encounter: Payer: Self-pay | Admitting: Gastroenterology

## 2011-01-13 ENCOUNTER — Encounter: Payer: Self-pay | Admitting: Gastroenterology

## 2011-01-27 NOTE — Progress Notes (Signed)
Summary: labs  Phone Note Call from Patient Call back at [phone]   Caller: Patient Call For: tammy p Reason for Call: Talk to Nurse, Lab or Test Results Summary of Call: call w/lab results on her cell Initial call taken by: Eugene Gavia,  May 17, 2008 3:14 PM  Follow-up for Phone Call        Labs are still unsigned.  Please advise.  Thanks :) Follow-up by: Vernie Murders,  May 17, 2008 3:33 PM  Additional Follow-up for Phone Call Additional follow up Details #1::        cx still pending called spetrum will check in am Additional Follow-up by: Rubye Oaks NP,  May 17, 2008 4:34 PM    Additional Follow-up for Phone Call Additional follow up Details #2::    Spoke with pt and made aware that we will call tommorrow with results. Vernie Murders  May 17, 2008 4:37 PM

## 2011-01-27 NOTE — Letter (Signed)
Summary: [facility]  [facility]   Imported By: Sherian Rein 10/21/2009 10:24:04  _____________________________________________________________________  External Attachment:    Type:   Image     Comment:   External Document

## 2011-01-27 NOTE — Assessment & Plan Note (Signed)
Summary: 4 weeks/apc   Chief Complaint:  follow up - pt fell in [place], landed on middle back area, a lot of bruising and pain, and states "left leg gave out".  History of Present Illness: follow up   60 year old female with known history of [name], chronic low back pain with DDD followed by Dr. Jethro Bolus  and Hyperlipidemia .Recently seen for extremity edema   worse in afternoons. Given Lasix with some improvement. Sent for venous dopplers that were negative for DVT.  Also felt  Lexapro is not working, Changed to Cymbalta and does feel some better.  Recently traveled to [facility], stood up and left leg gave away with subsequent fall into bench/ newspapaer stand striking back and hip. Went to [facility] ER. Reports xray negative. Currently seeing ortho/neuro/pain spec for DDD/DJD, considering surgery.  Recent UTI tx with cipro, sx resolving. Denies chest pain, dyspnea, orthopnea, hemoptysis, fever, n/v/d, edema. Had stopped chol meds due to nausea. need repeat labs.        Prior Medication List:  PROTONIX 40 MG  TBEC (PANTOPRAZOLE SODIUM) Take 1 tablet by mouth once a day PROMETHAZINE HCL 25 MG  TABS (PROMETHAZINE HCL) 1 tab by mouth Q6H as needed for nausea... LEVBID 0.375 MG  TB12 (HYOSCYAMINE SULFATE) 1 tab by mouth two times a day as needed for abd cramping... EVISTA 60 MG  TABS (RALOXIFENE HCL) Take 1 tablet by mouth once a day MULTIVITAMINS   TABS (MULTIPLE VITAMIN) 1 tab daily... CYMBALTA 30 MG  CPEP (DULOXETINE HCL) 1 by mouth once daily ALPRAZOLAM 0.5 MG  TABS (ALPRAZOLAM) Take 1 tab by mouth at bedtime VICODIN 5-500 MG  TABS (HYDROCODONE-ACETAMINOPHEN) 1 tab by mouth Q6H as needed for severe pain... VITAMIN D 40981 UNIT  CAPS (ERGOCALCIFEROL) 1 by mouth EVERY WEEK LASIX 20 MG  TABS (FUROSEMIDE) 1 by mouth once daily as needed leg swelling PREDNISONE 10 MG  TABS (PREDNISONE) 4 tabs for 2 days, then 3 tabs for 2 days, 2 tabs for 2 days, then 1 tab for 2 days, then stop CIPRO 250 MG  TABS  (CIPROFLOXACIN HCL) 1 by mouth two times a day   Current Allergies (reviewed today): ! SULFA ! * TEQUIN ! LIPITOR (ATORVASTATIN CALCIUM)  Past Medical History:    Reviewed history from 04/12/2008 and no changes required:       BRONCHITIS, RECURRENT (ICD-491.9)       CIGARETTE SMOKER (ICD-305.1)       REFLUX ESOPHAGITIS (ICD-530.11)       DIVERTICULOSIS OF COLON (ICD-562.10)       [name] (ICD-564.1)       COLONIC POLYPS (ICD-211.3)       CALCULUS, KIDNEY (ICD-592.0)       BACK PAIN, LUMBAR (ICD-724.2)       OSTEOPENIA (ICD-733.90)       HEADACHE (ICD-784.0)       VERTIGO (ICD-780.4)       ANXIETY (ICD-300.00)          Family History:    Reviewed history from 12/06/2007 and no changes required:       Father:  died age 65 w/ colon ca & lung mets; hx prostate ca aswell.       Mother:  [name], age 67 w/ hx heart dz & prev MVR; also has DJD.       3 Sibs:  2Bro & 1Sis - 1Bro w/ colon ca recently dx'd & awaiting surgin [place]; sis w/ hx skin squam cell ca's.  Social  History:    Reviewed history from 12/06/2007 and no changes required:       Married to husb for 30 yrs         4 children - all in good health       Works for [employer]       She has BS degreein Horiculture from [place].              +Smoker - starting in 20's til present, up to 1&1/2 ppd for a 30+ pack year hx.       Social Etoh, no drugs.   Risk Factors: Tobacco use:  current    Cigarettes:  Yes -- 1 pack(s) per day   Review of Systems      See HPI   Vital Signs:  Patient Profile:   60 Years Old Female Weight:      131 pounds O2 Sat:      99 % O2 treatment:    Room Air Temp:     97.6 degrees F oral Pulse rate:   107 / minute BP sitting:   116 / 80  (left arm) Cuff size:   regular  Vitals Entered By: Boone Master CNA (May 24, 2008 10:26 AM)             Is Patient Diabetic? No Comments Medications reviewed with patient Boone Master CNA  May 24, 2008 10:31 AM      Physical Exam   GENERAL:  A/Ox3; pleasant & cooperative.NAD HEENT:  [place]/AT, EOM-wnl, PERRLA, EACs-clear, TMs-wnl, NOSE-clear, THROAT-clear & wnl. NECK:  Supple w/ fair ROM; no JVD; normal carotid impulses w/o bruits; no thyromegaly or nodules palpated; no lymphadenopathy. CHEST:  Clear to P & A; w/o, wheezes/ rales/ or rhonchi. HEART:  RRR, no m/r/g  heard ABDOMEN:  Soft & nt; nml bowel sounds; no organomegaly or masses detected. EXT: Warm bilat,  no calf pain, edema, clubbing, pulses intact Skin: bruising along post thoracic, no deformity noted.      Impression & Recommendations:  Problem # 1:  HYPERCHOLESTEROLEMIA (ICD-272.0) call phone tree for labs low fat chol diet follow up 1 month Dr. Kriste Basque  Please contact office for sooner follow up if symptoms do not improve or worsen  Orders: TLB-BMP (Basic Metabolic Panel-BMET) (80048-METABOL) TLB-Hepatic/Liver Function Pnl (80076-HEPATIC) TLB-Lipid Panel (80061-LIPID)  Labs Reviewed: Chol: 250 (03/06/2008)   HDL: 72.6 (03/06/2008)   LDL: DEL (03/06/2008)   TG: 119 (03/06/2008) SGOT: 23 (03/06/2008)   SGPT: 15 (03/06/2008)   Problem # 2:  EDEMA (ICD-782.3) improved lasix as needed  Orders: TLB-BMP (Basic Metabolic Panel-BMET) (80048-METABOL) Est. Patient Level IV (16109)   Problem # 3:  BACK PAIN, LUMBAR (ICD-724.2) Warm heat to back  call phone tree for labs low fat chol diet follow up 1 month Dr. Kriste Basque  Please contact office for sooner follow up if symptoms do not improve or worsen  follow up neurosurgeon tod Orders: Est. Patient Level IV (60454)   Medications Added to Medication List This Visit: 1)  Percocet 10-650 Mg Tabs (Oxycodone-acetaminophen) .... Use as directed when needed  Other Orders: T-Vitamin D (25-Hydroxy) ([phone])   Patient Instructions: 1)  Warm heat to back  2)  Finish Cipro  3)  call phone tree for labs 4)  low fat chol diet 5)  follow up 1 month Dr. Kriste Basque  6)  Please contact office for sooner follow up  if symptoms do not improve or worsen  7)  follow up  neurosurgeon today.    ]

## 2011-01-27 NOTE — Assessment & Plan Note (Signed)
Summary: b12 inj/jd   Nurse Visit   Allergies: 1)  ! Sulfa 2)  ! * Tequin 3)  ! Lipitor (Atorvastatin Calcium)  Medication Administration  Injection # 1:    Medication: Vit B12 1000 mcg    Diagnosis: VITAMIN B12 DEFICIENCY (ICD-266.2)    Route: SQ    Site: L deltoid    Exp Date: 08/31/2011    Lot #: [id number]    Mfr: American Regent    Comments: B12 1000MG     Patient tolerated injection without complications    Given by: TAMMY Sherlene Rickel IN [facility]  Orders Added: 1)  Vit B12 1000 mcg [J3420] 2)  Admin of Therapeutic Inj  intramuscular or subcutaneous [96372]   Medication Administration  Injection # 1:    Medication: Vit B12 1000 mcg    Diagnosis: VITAMIN B12 DEFICIENCY (ICD-266.2)    Route: SQ    Site: L deltoid    Exp Date: 08/31/2011    Lot #: [id number]    Mfr: American Regent    Comments: B12 1000MG     Patient tolerated injection without complications    Given by: TAMMY Daquarius Dubeau IN [facility]  Orders Added: 1)  Vit B12 1000 mcg [J3420] 2)  Admin of Therapeutic Inj  intramuscular or subcutaneous [21308]

## 2011-01-27 NOTE — Progress Notes (Signed)
Summary: nos appt  Phone Note Call from Patient   Caller: [email or web address]  Call For: Airyn Ellzey Summary of Call: LMTCB x2 to rsc nos from 8/9. Initial call taken by: Darletta Moll,  August 06, 2010 3:14 PM

## 2011-01-27 NOTE — Assessment & Plan Note (Signed)
Summary: F/U ER Visit, saw Denise Esterwood PA-C    History of Present Illness Visit Type: Follow-up Visit Primary GI MD: Melvia Heaps MD [facility] Primary Provider: Alroy Dust, MD Requesting Provider: n/a Chief Complaint: F/u for worsening diarrhea.  Pt states diarrhea is better but having abd cramping and fever that started Sunday  History of Present Illness:   Denise King has returned again complaining of abdominal pain.  She was seen approximately 10 days ago for abdominal pain and diarrhea.  She was evaluated in the emergency room subsequent to that time for recurrent pain and diarrhea.  Stool studies were negative.  CT Scan from January, 2011 was unremarkable.  She was treated empirically with Flagyl.  She claims her diarrhea subsided with Lomotil.  Over the last few days severe left lower quadrant pain history returned.  She has pain in her lower back which radiates high.  She claims to have low-grade fevers as well.  She has diverticulosis.  She denies urinary frequency or dysuria.   GI Review of Systems      Denies abdominal pain, acid reflux, belching, bloating, chest pain, dysphagia with liquids, dysphagia with solids, heartburn, loss of appetite, nausea, vomiting, vomiting blood, weight loss, and  weight gain.        Denies anal fissure, black tarry stools, change in bowel habit, constipation, diarrhea, diverticulosis, fecal incontinence, heme positive stool, hemorrhoids, irritable bowel syndrome, jaundice, light color stool, liver problems, rectal bleeding, and  rectal pain.    Current Medications (verified): 1)  Lasix 20 Mg  Tabs (Furosemide) .Marland Kitchen.. 1 By Mouth Once Daily As Needed Leg Swelling 2)  K-Tabs 10 Meq  Tbcr (Potassium Chloride) .Marland Kitchen.. 1 By Mouth Once Daily  With Lasix As Needed For Leg Swelling. 3)  Protonix 40 Mg Tbec (Pantoprazole Sodium) .... Take 1 Tablet By Mouth Once A Day 4)  Glycopyrrolate 2 Mg Tabs (Glycopyrrolate) .... Take One By Mouth Two Times A Day 5)   Promethazine Hcl 25 Mg  Tabs (Promethazine Hcl) .Marland Kitchen.. 1 Tab By Mouth Q6h As Needed For Nausea.Marland KitchenMarland Kitchen 6)  Cymbalta 60 Mg Cpep (Duloxetine Hcl) .... Take 1 Tab By Mouth Once Daily.Marland KitchenMarland Kitchen 7)  Opana Er 5 Mg Xr12h-Tab (Oxymorphone Hcl) .... Take 1-2 Tablets By Mouth Every 6 Hours As Needed For Breakthough Pain 8)  Multivitamins   Tabs (Multiple Vitamin) .Marland Kitchen.. 1 Tab Daily.Marland KitchenMarland Kitchen 9)  Vitamin D 1000 Unit Tabs (Cholecalciferol) .... Take 1 Tab By Mouth Once Daily... 10)  Cyanocobalamin 1000 Mcg/ml Soln (Cyanocobalamin) .... Injection Once A Month 11)  Lunesta 2 Mg Tabs (Eszopiclone) .... Take 1 Tab At Bedtime As Needed For Sleep 12)  Chantix Starting Month Pak 0.5 Mg X 11 & 1 Mg X 42 Tabs (Varenicline Tartrate) .... Take As Directed  Allergies (verified): 1)  ! Sulfa 2)  ! * Tequin 3)  ! Lipitor (Atorvastatin Calcium)  Past History:  Past Medical History: Reviewed history from 02/05/2010 and no changes required.  BRONCHITIS, RECURRENT (ICD-491.9) CIGARETTE SMOKER (ICD-305.1) VENOUS INSUFFICIENCY (ICD-459.81) GERD (ICD-530.81) REFLUX ESOPHAGITIS (ICD-530.11) DIVERTICULOSIS OF COLON (ICD-562.10) IRRITABLE BOWEL SYNDROME (ICD-564.1) COLONIC POLYPS (ICD-211.3) CALCULUS, KIDNEY (ICD-592.0) BACK PAIN (ICD-724.5) CHRONIC PAIN SYNDROME (ICD-338.4) OSTEOPENIA (ICD-733.90) HEADACHE (ICD-784.0) VERTIGO (ICD-780.4) DYSTHYMIA (ICD-300.4) DEPRESSION (ICD-311) VITAMIN B12 DEFICIENCY (ICD-266.2)  Past Surgical History: Reviewed history from 02/05/2010 and no changes required. Appendectomy Tubal ligation - DrHolland Back Surgery- 10/09 by DrCohen Tonsillectomy  Family History: Reviewed history from 01/27/2010 and no changes required. Father:  died age 71 w/ colon ca & lung  mets; hx prostate ca aswell. Mother:  Denise King, age 41 w/ hx heart dz & prev MVR; also has DJD. 3 Sibs:  2Bro & 1Sis -  1Bro w/ colon ca recently dx'd & awaiting surgin [place];  sis w/ hx skin squam cell ca's. Family History of Colon Polyps:  Brother  Social History: Reviewed history from 01/27/2010 and no changes required. 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 +Smoker - starting in 20's til present, up to 1&1/2 ppd for a 30+ pack year hx. no drugs. Alcohol Use - no Illicit Drug Use - no  Review of Systems       The patient complains of fatigue and fever.  The patient denies allergy/sinus, anemia, anxiety-new, arthritis/joint pain, back pain, blood in urine, breast changes/lumps, change in vision, confusion, cough, coughing up blood, depression-new, fainting, headaches-new, hearing problems, heart murmur, heart rhythm changes, itching, menstrual pain, muscle pains/cramps, night sweats, nosebleeds, pregnancy symptoms, shortness of breath, skin rash, sleeping problems, sore throat, swelling of feet/legs, swollen lymph glands, thirst - excessive , urination - excessive , urination changes/pain, urine leakage, vision changes, and voice change.    Vital Signs:  Patient profile:   60 year old female Height:      64 inches Weight:      116 pounds BMI:     19.98 BSA:     1.55 Temp:     97.7 degrees F oral Pulse rate:   96 / minute Pulse rhythm:   regular BP sitting:   120 / 64  (left arm) Cuff size:   regular  Vitals Entered By: Ok Anis CMA (February 18, 2010 3:49 PM)  Physical Exam  Additional Exam:  On physical exam she isa chronically ill-appearing female  skin: anicteric HEENT: normocephalic; PEERLA; no nasal or pharyngeal abnormalities neck: supple nodes: no cervical lymphadenopathy chest: clear to ausculatation and percussion heart: no murmurs, gallops, or rubs abd: soft, nontender; BS normoactive; no abdominal masses,  organomegaly; there is moderate tenderness to palpation left lower quadrant rectal: deferred ext: no cynanosis, clubbing, edema skeletal: no deformities neuro: oriented x 3; no focal  abnormalities    Impression & Recommendations:  Problem # 1:  ABDOMINAL PAIN, LEFT LOWER QUADRANT (ICD-789.04)  Symptoms are suspicious for acute, recurrent diverticulitis.  This is despite a CT scan from one month ago that was negative.    Recommendations #1 repeat CT of the abdomen and pelvis #2 to consider sigmoidoscopy if CT is entirely normal  Orders: CT Abdomen/Pelvis with Contrast (CT Abd/Pelvis w/con)  Problem # 2:  DIARRHEA (ICD-787.91) Assessment: Improved  Patient Instructions: 1)  Your CT is scheduled for 02/19/2010 at 2pm at [facility] CT 2)  The medication list was reviewed and reconciled.  All changed / newly prescribed medications were explained.  A complete medication list was provided to the patient / caregiver.

## 2011-01-27 NOTE — Assessment & Plan Note (Signed)
Summary: 6 months/bloodwork/apc   Primary Care Provider:  Alroy Dust, MD  CC:  6 month ROV & review of mult medical problems....  History of Present Illness: 60 y/o WF here for a follow up visit... she has multiple medical problems as noted below...    ~  Mar09: 1)  FLP was abn w/ TChol 250, LDL 156... Simvastatin 20mg /d started, but she reports intolerant... therefore she went on low chol/ low fat diet and repeat FLP 5/09 showed TChol 205, LDL 102 = much improved, continue diet efforts... and 2) VitD level was low at 12 (30-90) so VitD 50K per week was started... tol well, continue same Rx for now...   ~  seen 4/09 for edema- Rx Lasix with some improvement... venous dopplers that were negative for DVT...  Lexapro was not working & changed to Cymbalta (does feel some better)...  traveled to [facility], stood up and left leg gave away with subsequent fall into bench/ newspapaer stand striking back and hip- went to [facility] ER & reports xray negative... initiated w/u w/ DrVoytek, Ortho & Pain Management, DrKirstens...    ~  eval DrVoytek w/ DDD and shots w/o help... she's had MRI and discogram w/ review by DrCohen and surg is planned at [facility] in Oct... she tells me she will need to be off work an additional 3 months after the surg... she prev saw DrNudelman who didn't think surg would help her- but this was before the discogram... I have encouraged her to f/u w/ neurosurg for their review of the additional studies before she proceeds...   ~  August 06, 2009:  had back surg w/ fusion by DrCohen at [facility] hosp in Oct09... much difficulty after surg- out of work til 4/10 (ret part time) and 8/10 (just ret full time)... she is seeing DrPhillips for pain management now on EMBEDA 50-2 Bid + MSIR for breakthrough pain...   ~  February 08, 2010:  recent GI bug & [name] flair w/ left flank pain- stool studies neg & Rx w/ Levsin, empiric Flagyl, Florastor, & Lomotil...  she continues regular f/u in pain clinic w/  DrPhillips- now on OPANA & Cymbalta... f/u labs today.    Current Problem List:  VERTIGO (ICD-780.4) - Hosp 8/08 w/ neg eval and eventually referred to Neurology w/ nothing found; Rx'd w/ Antivert, Phenergan, vestib therapy...  BRONCHITIS, RECURRENT (ICD-491.9) - 1ppd smoker w/ hx recurrent bronchitis... counselled to quit.  VENOUS INSUFFICIENCY (ICD-459.81) - she knows to avoid sodium, elevate legs, wear support hose... has LASIX 20mg  Prn & KCl .  REFLUX ESOPHAGITIS (ICD-530.11) - EGD 9/05 by DrPerry showed mod severe reflux esoph w/ erosions,  Rx PROTONIX 40mg /d regularly w/ good control, but states she needs it Bid...  DIVERTICULOSIS OF COLON (ICD-562.10), &  [name] (ICD-564.1) - she takes LEVBID Bid prn...  ~  extensive GI eval by DrKaplan 1/10 for abd pain, [name], etc... neg EGD, neg CTAbd, neg Emptying scan... believed secondary to narcotic analgesics...  COLONIC POLYPS (ICD-211.3) - Last colonoscopy 2/06 by [name] showed Divertic, otherw neg; f/u 47yrs w/ +fam hx colon ca.  CALCULUS, KIDNEY (ICD-592.0) - Hx of Kidney Stones & small renal cysts on CT scan & sonar - followed by DrWrenn.  BACK PAIN, LUMBAR (ICD-724.2) - initial eval and rx by [facility] w/ DDD and no surg possible... she's had series of epidural shots from DrStrong without relief... additional opinion from [facility]- surg not recommended... referred to a chronic pain clinic...  ~  fall  w/ injury 2009 & further eval from DrCohen- she decided to proceed w/ spinal surgery & fusion by DrCohen 10/09 at [facility] hosp... much difficulty post op & now followed in the pain management clinic by DrPhillips.  ~  she reports another opinion from DrBranch at [facility]- "he is considering additional surg"...  CHRONIC PAIN SYNDROME (ICD-338.4) - followed by drPhillips pain clinic... currently on OPANA 10mg  every 4-6H & using 4-6 daily... also on CYMBALTA 60mg /d...  OSTEOPENIA (ICD-733.90) - she was prev on EVISTA, Ca++, Vits (w/ BMD per GYN)...  she stopped the Evista due to nausea which she feels is better off this med.  ~  labs 3/09 showed Vit d level = 12... started on Vit D 50000 u weekly.  ~  labs 6/10 showed Vit D level = 49... OK to switch to 1000 u OTC daily.  ~  8/10:  due for f/u GYN eval and BMD at [facility] office.  HEADACHE (ICD-784.0)  ANXIETY (ICD-300.00) - prev on Lexapro & Xanax, but these were stopped in favor of CYMBALTA 60mg /d, and LUNESTA 2mg  Qhs...  VITAMIN B12 DEFICIENCY (ICD-266.2) - Vit B12 level 10/10 = 118 & started on B 12 shots monthly... f/u B 12 level 2/11 = >1500...  HEALTH MAINTENANCE:  GYN= DrHolland & PAP, Mammogram, etc... all up-to-date; she is due a BMD in 1/10; and colon 10/11...   Allergies: 1)  ! Sulfa 2)  ! * Tequin 3)  ! Lipitor (Atorvastatin Calcium)  Comments:  Nurse/Medical Assistant: The patient's medications and allergies were reviewed with the patient and were updated in the Medication and Allergy Lists.  Past History:  Past Medical History:  BRONCHITIS, RECURRENT (ICD-491.9) CIGARETTE SMOKER (ICD-305.1) VENOUS INSUFFICIENCY (ICD-459.81) GERD (ICD-530.81) REFLUX ESOPHAGITIS (ICD-530.11) DIVERTICULOSIS OF COLON (ICD-562.10) IRRITABLE BOWEL SYNDROME (ICD-564.1) COLONIC POLYPS (ICD-211.3) CALCULUS, KIDNEY (ICD-592.0) BACK PAIN (ICD-724.5) CHRONIC PAIN SYNDROME (ICD-338.4) OSTEOPENIA (ICD-733.90) HEADACHE (ICD-784.0) VERTIGO (ICD-780.4) DYSTHYMIA (ICD-300.4) DEPRESSION (ICD-311) VITAMIN B12 DEFICIENCY (ICD-266.2)  Past Surgical History: Appendectomy Tubal ligation - DrHolland Back Surgery- 10/09 by DrCohen Tonsillectomy  Family History: Reviewed history from 01/27/2010 and no changes required. Father:  died age 103 w/ colon ca & lung mets; hx prostate ca aswell. Mother:  [name], age 18 w/ hx heart dz & prev MVR; also has DJD. 3 Sibs:  2Bro & 1Sis -  1Bro w/ colon ca recently dx'd & awaiting surgin [place];  sis w/ hx skin squam cell ca's. Family History of  Colon Polyps: Brother  Social History: Reviewed history from 01/27/2010 and no changes required. 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 +Smoker - starting in 20's til present, up to 1&1/2 ppd for a 30+ pack year hx. no drugs. Alcohol Use - no Illicit Drug Use - no  Review of Systems      See HPI       The patient complains of dyspnea on exertion, headaches, abdominal pain, and depression.  The patient denies anorexia, fever, weight loss, weight gain, vision loss, decreased hearing, hoarseness, chest pain, syncope, peripheral edema, prolonged cough, hemoptysis, melena, hematochezia, severe indigestion/heartburn, hematuria, incontinence, muscle weakness, suspicious skin lesions, transient blindness, difficulty walking, unusual weight change, abnormal bleeding, enlarged lymph nodes, and angioedema.    Vital Signs:  Patient profile:   60 year old female Height:      64 inches Weight:      122.13 pounds O2 Sat:  93 % on Room air Temp:     97.0 degrees F oral Pulse rate:   101 / minute BP sitting:   128 / 82  (left arm) Cuff size:   regular  Vitals Entered By: Randell Loop CMA (February 05, 2010 9:53 AM)  O2 Sat at Rest %:  93 O2 Flow:  Room air CC: 6 month ROV & review of mult medical problems... Is Patient Diabetic? No Pain Assessment Patient in pain? yes     Location: abdomen Onset of pain  pain x 2 wks in the llq Comments meds updated today    Physical Exam  Additional Exam:  WD, WN, 60 y/o WF in NAD... GENERAL:  Alert & oriented; pleasant & cooperative... HEENT:  [place]/AT,  EACs-clear, TMs-wnl, NOSE-clear, THROAT-clear & wnl. NECK:  Supple w/ fairROM; no JVD; normal carotid impulses w/o bruits; no thyromegaly or nodules palpated; no lymphadenopathy. CHEST:  Clear to P & A; without wheezes/ rales/ or rhonchi heard... HEART:  Regular Rhythm; without murmurs/ rubs/ or  gallops detected... ABDOMEN:  Soft & nontender; normal bowel sounds; no organomegaly or masses palpated... EXT: without deformities, mild arthritic changes; no varicose veins/ +venous insuffic/ no edema.,  limp w/ walking.  BACK:  c/o mod severe back pain... NEURO:  no focal neuro deficits... DERM:  no lesions see...   MISC. Report  Procedure date:  02/05/2010  Findings:      Lipid Panel (LIPID)   Cholesterol               142 mg/dL                   4-098   Triglycerides             84.0 mg/dL                  1.1-914.7   HDL                       82.95 mg/dL                 >62.13   LDL Cholesterol           76 mg/dL                    0-86  BMP (METABOL)   Sodium                    140 mEq/L                   135-145   Potassium                 3.9 mEq/L                   3.5-5.1   Chloride                  102 mEq/L                   96-112   Carbon Dioxide            32 mEq/L                    19-32   Glucose                   95 mg/dL  70-99   BUN                  [L]  4 mg/dL                     7-82   Creatinine                0.6 mg/dL                   9.5-6.2   Calcium                   9.3 mg/dL                   1.3-08.6   GFR                       109.63 mL/min               >60  Hepatic/Liver Function Panel (HEPATIC)   Total Bilirubin           0.3 mg/dL                   5.7-8.4   Direct Bilirubin          0.1 mg/dL                   6.9-6.2   Alkaline Phosphatase      66 U/L                      39-117   AST                       23 U/L                      0-37   ALT                       14 U/L                      0-35   Total Protein             7.1 g/dL                    9.5-2.8   Albumin                   3.7 g/dL                    4.1-3.2  Comments:      CBC Platelet w/Diff (CBCD)   White Cell Count     [H]  11.5 K/uL                   4.5-10.5   Red Cell Count            4.18 Mil/uL                 3.87-5.11   Hemoglobin                 13.9 g/dL                   44.0-10.2   Hematocrit                42.1 %  36.0-46.0   MCV                  [H]  100.6 fl                    78.0-100.0   Platelet Count            399.0 K/uL                  150.0-400.0   Neutrophil %              57.6 %                      43.0-77.0   Lymphocyte %              35.0 %                      12.0-46.0   Monocyte %                6.3 %                       3.0-12.0   Eosinophils%              0.9 %                       0.0-5.0   Basophils %               0.2 %                       0.0-3.0  TSH (TSH)   FastTSH              [L]  0.31 uIU/mL                 0.35-5.50  B12 + Folate Panel (B12/FOL)   Vitamin B12          [H]  >1500 pg/mL                 211-911   Folate                    8.7 ng/mL   IBC Panel (IBC)   Iron                      121 ug/dL                   16-109   Transferrin          [L]  163.6 mg/dL                 604.5-409.8   Iron Saturation      [H]  52.8 %                      20.0-50.0  Vitamin D (25-Hydroxy) (11914)  Vitamin D (25-Hydroxy)                             30 ng/mL                    30-89   Impression & Recommendations:  Problem # 1:  BRONCHITIS, RECURRENT (ICD-491.9) She must quit all smoking... she will try Chantix again...  Problem # 2:  GERD (  ICD-530.81) Stable on the PPI therapy... Her updated medication list for this problem includes:    Protonix 40 Mg Tbec (Pantoprazole sodium) .Marland Kitchen... Take 1 tablet by mouth once a day    Glycopyrrolate 2 Mg Tabs (Glycopyrrolate) .Marland Kitchen... Take one by mouth two times a day  Problem # 3:  IRRITABLE BOWEL SYNDROME (ICD-564.1) She has recent diarrhea treated by GI & underlying [name] on Rx w/ Levsin, etc...  Problem # 4:  BACK PAIN (ICD-724.5) She has severe back problems & chr pain syndrome- followedby drPhillips pain clinic.. The following medications were removed from the medication list:    Orphenadrine Citrate Cr 100 Mg Xr12h-tab  (Orphenadrine citrate) .Marland Kitchen... Take 1 tab by mouth at bedtime for muscle relaxation Her updated medication list for this problem includes:    Opana Er 5 Mg Xr12h-tab (Oxymorphone hcl) .Marland Kitchen... Take 1-2 tablets by mouth every 6 hours as needed for breakthough pain  Problem # 5:  DYSTHYMIA (ICD-300.4) She is on Cymbalta & uses Lunesta to help[ w/ sleep...  Problem # 6:  VITAMIN B12 DEFICIENCY (ICD-266.2) B12 level has improved on the monthly shots...  Problem # 7:  OTHER MEDICAL PROBLEMS AS NOTED... F/u labs look good today...  Complete Medication List: 1)  Lasix 20 Mg Tabs (Furosemide) .Marland Kitchen.. 1 by mouth once daily as needed leg swelling 2)  K-tabs 10 Meq Tbcr (Potassium chloride) .Marland Kitchen.. 1 by mouth once daily  with lasix as needed for leg swelling. 3)  Protonix 40 Mg Tbec (Pantoprazole sodium) .... Take 1 tablet by mouth once a day 4)  Glycopyrrolate 2 Mg Tabs (Glycopyrrolate) .... Take one by mouth two times a day 5)  Promethazine Hcl 25 Mg Tabs (Promethazine hcl) .Marland Kitchen.. 1 tab by mouth q6h as needed for nausea.Marland KitchenMarland Kitchen 6)  Cymbalta 60 Mg Cpep (Duloxetine hcl) .... Take 1 tab by mouth once daily.Marland KitchenMarland Kitchen 7)  Opana Er 5 Mg Xr12h-tab (Oxymorphone hcl) .... Take 1-2 tablets by mouth every 6 hours as needed for breakthough pain 8)  Multivitamins Tabs (Multiple vitamin) .Marland Kitchen.. 1 tab daily.Marland KitchenMarland Kitchen 9)  Vitamin D 1000 Unit Tabs (Cholecalciferol) .... Take 1 tab by mouth once daily... 10)  Cyanocobalamin 1000 Mcg/ml Soln (Cyanocobalamin) .... Injection once a month 11)  Lunesta 2 Mg Tabs (Eszopiclone) .... Take 1 tab at bedtime as needed for sleep 12)  Flagyl 250 Mg Tabs (Metronidazole) .... Take 1 tab 4 times daily x 14 days 13)  Florastor 250 Mg Caps (Saccharomyces boulardii) .... Take 1 tab twice daily x 14 days 14)  Chantix Starting Month Pak 0.5 Mg X 11 & 1 Mg X 42 Tabs (Varenicline tartrate) .... Take as directed  Other Orders: TLB-Lipid Panel (80061-LIPID) TLB-BMP (Basic Metabolic Panel-BMET)  (80048-METABOL) TLB-Hepatic/Liver Function Pnl (80076-HEPATIC) TLB-CBC Platelet - w/Differential (85025-CBCD) TLB-TSH (Thyroid Stimulating Hormone) (84443-TSH) TLB-B12 + Folate Pnl (11914_78295-A21/HYQ) TLB-IBC Pnl (Iron/FE;Transferrin) (83550-IBC) T-Vitamin D (25-Hydroxy) (65784-69629) Vit B12 1000 mcg (J3420) Admin of Therapeutic Inj  intramuscular or subcutaneous (52841)  Patient Instructions: 1)  Today we updated your med list- see below.... 2)  Continue your current meds the same for now... 3)  Today we did your follow up FASTING blood work... please call the "phone tree" in a few days for your lab results.Marland KitchenMarland Kitchen 4)  Call for any questions.Marland KitchenMarland Kitchen 5)  Please schedule a follow-up appointment in 6 months, sooner as needed...   Medication Administration  Injection # 1:    Medication: Vit B12 1000 mcg    Diagnosis: UNSPECIFIED ANEMIA (ICD-285.9)    Route: IM  Site: L deltoid    Exp Date: 09/09/2011    Lot #: [id number]    Mfr: American Regent    Comments: injection given by Dimas Millin    Patient tolerated injection without complications  Orders Added: 1)  Est. Patient Level IV [96045] 2)  TLB-Lipid Panel [80061-LIPID] 3)  TLB-BMP (Basic Metabolic Panel-BMET) [80048-METABOL] 4)  TLB-Hepatic/Liver Function Pnl [80076-HEPATIC] 5)  TLB-CBC Platelet - w/Differential [85025-CBCD] 6)  TLB-TSH (Thyroid Stimulating Hormone) [84443-TSH] 7)  TLB-B12 + Folate Pnl [82746_82607-B12/FOL] 8)  TLB-IBC Pnl (Iron/FE;Transferrin) [83550-IBC] 9)  T-Vitamin D (25-Hydroxy) [40981-19147] 10)  Vit B12 1000 mcg [J3420] 11)  Admin of Therapeutic Inj  intramuscular or subcutaneous [82956]

## 2011-01-27 NOTE — Assessment & Plan Note (Signed)
Summary: Vit B12 injection MBW   Nurse Visit   Allergies: 1)  ! Sulfa 2)  ! * Tequin 3)  ! Lipitor (Atorvastatin Calcium)  Medication Administration  Injection # 1:    Medication: Vit B12 1000 mcg    Diagnosis: UNSPECIFIED ANEMIA (ICD-285.9)    Route: IM    Site: L deltoid    Exp Date: 07/2010    Lot #: [id number]    Mfr: American Regent    Comments: Injection given by Drucie Opitz, CMA in allergy lab.     Patient tolerated injection without complications  Orders Added: 1)  Admin of Therapeutic Inj  intramuscular or subcutaneous [96372] 2)  Vit B12 1000 mcg [J3420]

## 2011-01-27 NOTE — Procedures (Signed)
Summary: [facility] EGD 09/25/2004   EGD  Procedure date:  09/25/2004  Findings:      Location: [facility]    Patient Name: Denise King, Denise King MRN: [id number] Procedure Procedures: Panendoscopy (EGD) CPT: 43235.  Personnel: Endoscopist: Wilhemina Bonito. [name], MD.  Exam Location: Exam performed in Endoscopy Suite.  Patient Consent: Procedure, Alternatives, Risks and Benefits discussed, consent obtained,  Indications  Abnormal Exams, Studies: CT scan, abnormal.  Symptoms: Nausea. Vomiting. Abdominal pain, location: LLQ.  History  Current Medications: Patient is not currently taking Coumadin.  Pre-Exam Physical: Performed Jul 16, 2003  Cardio-pulmonary exam, HEENT exam, Abdominal exam, Extremity exam, Neurological exam, Mental status exam WNL.  Exam Exam Info: Maximum depth of insertion Duodenum, intended Duodenum. Patient position: on left side. Vocal cords visualized. Gastric retroflexion performed. Images taken. ASA Classification: I. Tolerance: excellent.  Sedation Meds: Demerol 50 mg. given IV. Versed 7 mg. given IV.  Monitoring: BP and pulse monitoring done. Oximetry used. Supplemental O2 given  Fluoroscopy: Fluoroscopy was not used.  Findings ESOPHAGEAL INFLAMMATION: as a result of reflux. Severity is moderate, erosions present.  Length of inflammation: 2 cm. [place] Classification: Grade A. ICD9: Esophagitis, Reflux: 530.11.    Comments: OTHERWISE NORMAL EGD TO D3 Assessment Abnormal examination, see findings above.  Diagnoses: 530.11: Esophagitis, Reflux.  530.81: GERD.   Events  Unplanned Intervention: No unplanned interventions were required.  Unplanned Events: There were no complications. Plans Medication(s): PPI: Pantoprazole/Protonix 40 mg QD, starting Sep 25, 2004   Disposition: After procedure patient sent to recovery. After recovery patient sent back to hospital.    cc: Dr Melvia Heaps  This report was created from the original  endoscopy report, which was reviewed and signed by the above listed endoscopist.

## 2011-01-27 NOTE — Assessment & Plan Note (Signed)
Summary: WORSENING DIARRHEA/ER VISIT LAST WEEK   (DR.KAPLAN PT)    DEB...    History of Present Illness Visit Type: Follow-up Visit Primary GI MD: Melvia Heaps MD [facility] Primary Provider: Alroy Dust, MD Requesting Provider: n/a Chief Complaint: Patient c/o 2 weeks severe diarrhea (8-10 bowel movements daily) and LLQ abdominal pain. She denies any brbpr or any nausea, vomiting or fever. History of Present Illness:   60 YO FEMALE KNOWN TO DR Arlyce King WITH HX OF [name],DIVERTICULOSIS AND GERD. LAST COLONOSCOPY;10/07 SHOWED LEFT FEW SIDED DIVERTICULI. SHE COMES IN TODAY  WITH C/O 2-3 WEEKS OF CONSTANT DIARRHEA,WITH 7-8 BMS EACH DAY AND USUALLY UP TWICE AT NIGHT WITH DIARRHEA. NO MELENA OR HEME,BUT HAS HAD A LOT OF MUCOUS AND MALODOROUS STOOL. NO FEVER,+SWEATS INTERMITTENTLY. SHE IS EATING FAIR,TRYING TO PUSH FLUIDS. SHE C/O PAIN ON HER LEFT SIDE DESPITE LEVBID TWICE DAILY. SHE LAST TOOK A COURSE OF ABX TOWARD THE END OF NOVEMBER-AUGMENTIN. ONLY NEW MED IS OPANA FOR PAIN,SAYS THE DIARRHEA PRECEEDED STARTING OPANA.   GI Review of Systems    Reports abdominal pain, acid reflux, and  nausea.     Location of  Abdominal pain: LLQ.    Denies belching, bloating, chest pain, dysphagia with liquids, dysphagia with solids, heartburn, loss of appetite, vomiting, vomiting blood, and  weight loss.      Reports change in bowel habits, diarrhea, diverticulosis, and  irritable bowel syndrome.     Denies anal fissure, black tarry stools, constipation, fecal incontinence, heme positive stool, hemorrhoids, jaundice, light color stool, liver problems, rectal bleeding, and  rectal pain. Preventive Screening-Counseling & Management      Drug Use:  no.      Current Medications (verified): 1)  Lasix 20 Mg  Tabs (Furosemide) .Marland Kitchen.. 1 By Mouth Once Daily As Needed Leg Swelling 2)  K-Tabs 10 Meq  Tbcr (Potassium Chloride) .Marland Kitchen.. 1 By Mouth Once Daily  With Lasix As Needed For Leg Swelling. 3)  Levbid 0.375 Mg  Tb12 (Hyoscyamine  Sulfate) .Marland Kitchen.. 1 Tab By Mouth Two Times A Day As Needed For Abd Cramping... 4)  Promethazine Hcl 25 Mg  Tabs (Promethazine Hcl) .Marland Kitchen.. 1 Tab By Mouth Q6h As Needed For Nausea.Marland KitchenMarland Kitchen 5)  Vitamin D 16109 Unit  Caps (Ergocalciferol) .Marland Kitchen.. 1 By Mouth Every Week 6)  Multivitamins   Tabs (Multiple Vitamin) .Marland Kitchen.. 1 Tab Daily.Marland KitchenMarland Kitchen 7)  Cymbalta 60 Mg Cpep (Duloxetine Hcl) .... Take 1 Tab By Mouth Once Daily.Marland KitchenMarland Kitchen 8)  Protonix 40 Mg Tbec (Pantoprazole Sodium) .... Take 1 Tablet By Mouth Once A Day 9)  Orphenadrine Citrate Cr 100 Mg Xr12h-Tab (Orphenadrine Citrate) .... Take 1 Tab By Mouth At Bedtime For Muscle Relaxation 10)  Cyanocobalamin 1000 Mcg/ml Soln (Cyanocobalamin) .... Injection Once A Month 11)  Opana Er 5 Mg Xr12h-Tab (Oxymorphone Hcl) .... Take 1-2 Tablets By Mouth Every 6 Hours As Needed For Breakthough Pain  Allergies (verified): 1)  ! Sulfa 2)  ! * Tequin 3)  ! Lipitor (Atorvastatin Calcium)  Past History:  Past Medical History: BRONCHITIS, RECURRENT (ICD-491.9) CIGARETTE SMOKER (ICD-305.1) VENOUS INSUFFICIENCY (ICD-459.81) HYPERCHOLESTEROLEMIA (ICD-272.0) REFLUX ESOPHAGITIS (ICD-530.11) DIVERTICULOSIS OF COLON (ICD-562.10) IRRITABLE BOWEL SYNDROME (ICD-564.1) DIVERTICULOSIS COLONIC POLYPS (ICD-211.3) CALCULUS, KIDNEY (ICD-592.0) BACK PAIN (ICD-724.5)/CHRONIC OSTEOPENIA (ICD-733.90) HEADACHE (ICD-784.0) VERTIGO (ICD-780.4) DYSTHYMIA (ICD-300.4)  Past Surgical History: Appendectomy Tubal ligation - DrHolland Back Surgery- 10/09 by DrCohen Tonsillectomy  Family History: Father:  died age 19 w/ colon ca & lung mets; hx prostate ca aswell. Mother:  [name], age 52 w/ hx heart  dz & prev MVR; also has DJD. 3 Sibs:  2Bro & 1Sis -  1Bro w/ colon ca recently dx'd & awaiting surgin [facility];  sis w/ hx skin squam cell ca's. 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 +Smoker - starting in 20's til present, up to 1&1/2 ppd for a 30+ pack year hx. no drugs. Alcohol Use - no Illicit Drug Use - no Drug Use:  no  Review of Systems       The patient complains of arthritis/joint pain, back pain, depression-new, fatigue, headaches-new, night sweats, shortness of breath, sleeping problems, thirst - excessive, urination - excessive, and urine leakage.         ROS OTHERWISE AS IN HPI-CONTINUES WITH CHRONIC BACK PAIN-FOLLOWED AT THE PAIN CLINIC  Vital Signs:  Patient profile:   60 year old female Height:      64 inches Weight:      117.25 pounds BMI:     20.20 BSA:     1.Denise Pulse rate:   96 / minute Pulse rhythm:   regular BP sitting:   132 / 70  (left arm)  Vitals Entered By: Hortense Ramal CMA Duncan Dull) (January 27, 2010 1:46 PM)  Physical Exam  General:  Well developed, well nourished, no acute distress.,THIN Head:  Normocephalic and atraumatic. Eyes:  PERRLA, no icterus. Lungs:  Clear throughout to auscultation. Heart:  Regular rate and rhythm; no murmurs, rubs,  or bruits. Abdomen:  SOFT, MILD TENDERNESS RIGHT AND LEFT LOWER ABDOMEN, NO GUARDING OR REBOUND,,NO MASS OR HSM Rectal:  NOT DONE Extremities:  No clubbing, cyanosis, edema or deformities noted. Neurologic:  Alert and  oriented x4;  grossly normal neurologically. Psych:  Alert and cooperative. Normal mood and affect.   Impression & Recommendations:  Problem # 1:  DIARRHEA (ICD-787.91) Assessment New 60 YO FEMALE WITH HX OF [name],MILD DIVERTICULAR DISEASE,WITH NEW C/O PERSISTENT DIARRHEA X 2-3 WEEKS;R/O C.DIFF,VS [name] EXACERBATION.  LABS AS BELOW STOOL CULTURES AS BELOW MAY INCREASE LEVSIN TO 3 X DAILY AS NEEDED FOR DISCOMFORT. START EMPIRIC FLAGYL 250 MG 4 X DAILY X 14 DAYS START FLORASTOR TWICE DAILY X 14 DAYS BLAND LOW RESIDUE DIET PT ADVISED TO CALL IF [facility] WORSEN FOLLOW UP WITH DR Arlyce King IN 3 WEEKS.  Problem # 2:  FAMILY HX COLON CANCER  (ICD-V16.0) Assessment: Comment Only UP TO DATE WITH COLONOSCOPY DUE 2016  Problem # 3:  DIVERTICULOSIS OF COLON (ICD-562.10) Assessment: Comment Only  Problem # 4:  GERD (ICD-530.81) Assessment: Comment Only  Problem # 5:  IRRITABLE BOWEL SYNDROME (ICD-564.1) Assessment: Comment Only SEE ABOVE  Problem # 6:  BACK PAIN (ICD-724.5) Assessment: Comment Only CHRONIC,NARCOTIC DEPENDENT  Other Orders: T-Culture, Stool ([id number]) T-Culture, C-Diff Toxin A/B ([id number]) T-Stool for O&P (09811-91478) T-Fecal WBC (29562-13086) TLB-BMP (Basic Metabolic Panel-BMET) (80048-METABOL) TLB-CBC Platelet - w/Differential (85025-CBCD)  Patient Instructions: 1)  Please go to the lab, basement level. 2)  We sent perscriptions for Flagyl and [facility]astor to [facility] [address]. 3)  You may increase the Levbid to 3 times daily if needed. 4)  We have faxed a perscription to your pharmacy for [facility].  5)  We made you a follow up appointment with Dr. Arlyce King for 02-18-10 at 3:45 PM.  6)  Copy sent to : Alroy Dust, MD 7)  The medication list was  reviewed and reconciled.  All changed / newly prescribed medications were explained.  A complete medication list was provided to the patient / caregiver. Prescriptions: LUNESTA 2 MG TABS (ESZOPICLONE) Take 1 tab at bedtime as needed for sleep  #30 x 0   Entered by:   Lowry Ram NCMA   Authorized by:   Sammuel Cooper PA-c   Signed by:   Lowry Ram NCMA on 01/27/2010   Method used:   Printed then faxed to ...       [facility]  [address] [id number])       [address]       [place], [place]  [address]       Ph: [phone]       Fax: [phone]   RxID:   [id number] FLORASTOR 250 MG CAPS (SACCHAROMYCES BOULARDII) Take 1 tab twice daily x 14 days  #28 x 0   Entered by:   Lowry Ram NCMA   Authorized by:   Sammuel Cooper PA-c   Signed by:   Lowry Ram NCMA on 01/27/2010   Method used:   Electronically to         [facility]  [address] [phone]* (retail)       [address]       [place], [place]  [address]       Ph: [phone]       Fax: [phone]   RxID:   [id number] FLAGYL 250 MG TABS (METRONIDAZOLE) Take 1 tab 4 times daily x 14 days  #Denise x 0   Entered by:   Lowry Ram NCMA   Authorized by:   Sammuel Cooper PA-c   Signed by:   Lowry Ram NCMA on 01/27/2010   Method used:   [facility]ron[facility]        [facility]  [address] [phone]* (retail)       [address]       [place], [place]  [address]       Ph: [phone]       Fax: [phone]   RxID:   [id number]

## 2011-01-27 NOTE — Miscellaneous (Signed)
Summary: [facility] Previsit/prep  Clinical Lists Changes Pt. decided to cancel her flex sig.  Her LUQ pain has subsided and she can't afford to miss work.

## 2011-01-27 NOTE — Progress Notes (Signed)
Summary: need prescript  Phone Note Call from Patient   Caller: Patient Call For: parrett Summary of Call: need cymbolta prescript call to pharmacy [facility] [address] Initial call taken by: Rickard Patience,  May 25, 2008 10:37 AM  Follow-up for Phone Call        [facility] and [facility] [address] lmom to make pt aware Follow-up by: Vernie Murders,  May 25, 2008 11:01 AM      Prescriptions: CYMBALTA 30 MG  CPEP (DULOXETINE HCL) 1 by mouth once daily  #30 x 11   Entered by:   Vernie Murders   Authorized by:   Michele Mcalpine MD   Signed by:   Vernie Murders on 05/25/2008   Method used:   Electronically [facility] to ...       [facility]  [address] [id number]       [address]       [place], [place]  [address]       Ph: [phone] or [phone]       Fax: [phone]   RxID:   [id number]

## 2011-01-27 NOTE — Progress Notes (Signed)
Summary: Xanax refill   Phone Note Refill Request Message from:  Fax from Pharmacy on October 10, 2010 4:06 PM  Refills Requested: Medication #1:  ALPRAZOLAM 0.5 MG TABS take one to 2 tabs q.6 h. p.r.n.Marland Kitchen   Dosage confirmed as above?Dosage Confirmed   Brand Name Necessary? No   Supply Requested: 1 month  Method Requested: Fax to Local Pharmacy Initial call taken by: Merri Ray CMA Duncan Dull),  October 10, 2010 4:06 PM

## 2011-01-27 NOTE — Assessment & Plan Note (Signed)
Summary: sEVERE EPIGASTRIC PAIN X1 WEEK. SEE TRIAGE FROM 01-03-09.   DEB...    History of Present Illness Visit Type: follow up Primary GI MD: Melvia Heaps MD [facility] Primary Provider: Lalla Brothers Requesting Provider: n/a Chief Complaint: Complains of Epigastric pain with n/v History of Present Illness:   Patient is worked in today for epigastric pain which started a couple of weeks ago. Has a remote history of this pain but nothing significant since starting Protonix in 2006. Epigastric burning is constant, non-radiating, not related to meals. Hyoscyamine helps for a few hours but then pain returns.  Has vomited several times since Christmas. No hematemesis. No black stools. As far as medications that could be causing the pain, the patient takes Ibuprofen 1-2 times a week. She has been on Oxycodone since having back surgery in September. She recently tried Cymbalta and Lyrica but stopped those medications the week before Christmas.  No black stools. No change in bowel habits except that patient's chronically loose stool have actually resolved since being on narcotics.     GI Review of Systems    Reports abdominal pain, belching, nausea, and  vomiting.     Location of  Abdominal pain: upper abdomen.    Denies acid reflux, bloating, chest pain, dysphagia with liquids, dysphagia with solids, heartburn, loss of appetite, vomiting blood, weight loss, and  weight gain.        Denies anal fissure, black tarry stools, change in bowel habit, constipation, diarrhea, diverticulosis, fecal incontinence, heme positive stool, hemorrhoids, irritable bowel syndrome, jaundice, light color stool, liver problems, rectal bleeding, and  rectal pain.     Prior Medications Reviewed Using: Medication Bottles  Updated Prior Medication List: LASIX 20 MG  TABS (FUROSEMIDE) 1 by mouth once daily as needed leg swelling K-TABS 10 MEQ  TBCR (POTASSIUM CHLORIDE) 1 by mouth once daily  with Lasix as needed for leg  swelling. PROTONIX 40 MG  TBEC (PANTOPRAZOLE SODIUM) Take 1 tablet by mouth once a day LEVBID 0.375 MG  TB12 (HYOSCYAMINE SULFATE) 1 tab by mouth two times a day as needed for abd cramping... PROMETHAZINE HCL 25 MG  TABS (PROMETHAZINE HCL) 1 tab by mouth Q6H as needed for nausea... EVISTA 60 MG  TABS (RALOXIFENE HCL) Take 1 tablet by mouth once a day VITAMIN D 09811 UNIT  CAPS (ERGOCALCIFEROL) 1 by mouth EVERY WEEK MULTIVITAMINS   TABS (MULTIPLE VITAMIN) 1 tab daily... ALPRAZOLAM 0.5 MG  TABS (ALPRAZOLAM) 1 at bedtime OXYCODONE-ACETAMINOPHEN 10-325 MG TABS (OXYCODONE-ACETAMINOPHEN) 1 tab every 4-6 hours as needed for pain  Current Allergies (reviewed today): ! SULFA ! * TEQUIN ! LIPITOR (ATORVASTATIN CALCIUM)  Past Medical History:    Reviewed history from 12/11/2008 and no changes required:       VERTIGO (ICD-780.4) - Hosp 8/08 w/ neg eval and eventually referred to Neurology w/ nothing found; Rx'd w/ Antivert, Phenergan, vestib therapy...              BRONCHITIS, RECURRENT (ICD-491.9) - 1ppd smoker w/ hx recurrent bronchitis... counselled to quit.              REFLUX ESOPHAGITIS (ICD-530.11) - EGD 9/05 by DrPerry showed mod severe reflux esoph w/ erosions, Rx PROTONIX regularly w/ good control, but states she needs it Bid...              DIVERTICULOSIS OF COLON (ICD-562.10), &  [name] (ICD-564.1) - she takes LEVBID Bidprn.Marland KitchenMarland Kitchen  COLONIC POLYPS (ICD-211.3) - Last colonoscopy 2/06 by [name] showed Divertic, otherw neg; f/u 75yrs w/ +fam hx colon ca.              CALCULUS, KIDNEY (ICD-592.0) - Hx of Kidney Stones & small renal cysts on CT scan & sonar - followed by DrWrenn.              BACK PAIN, LUMBAR (ICD-724.2) - eval and rx by DrVoytek w/ DDD and no surg possible... she's had series of epidural shots from DrStrong without relief... being referred to a chronic pain clinic...--s/p back surgery with Dr. Noel Gerold              OSTEOPENIA (ICD-733.90) - she is on EVISTA, Ca++,  Vits... w/ BMD per GYN              HEADACHE (ICD-784.0)              ANXIETY (ICD-300.00) - she is on LEXAPRO 10mg /d, and XANAX 0.5mg - 2tabQhs...              HEALTH MAINTENANCE:  GYN= DrHolland & PAP, Mammogram, etc... all up-to-date; she is due a BMD in 1/10; and colon 10/11...       H1N1 December 11, 2008                         Past Surgical History:    Reviewed history from 12/06/2007 and no changes required:       Appendectomy       Tubal ligation - DrHolland       Back Surgery   Family History:    Reviewed history from 12/06/2007 and no changes required:       Father:  died age 38 w/ colon ca & lung mets; hx prostate ca aswell.       Mother:  [name], age 75 w/ hx heart dz & prev MVR; also has DJD.       3 Sibs:  2Bro & 1Sis - 1Bro w/ colon ca recently dx'd & awaiting surgin [place]; sis w/ hx skin squam cell ca's.  Social History:    Reviewed history from 12/06/2007 and no changes required:       Married to husb for 30 yrs         4 children - all in good health       Works for [employer]       She has BS degreein Horiculture from [place].              +Smoker - starting in 20's til present, up to 1&1/2 ppd for a 30+ pack year hx.       Social Etoh, no drugs.    Review of Systems  The patient denies allergy/sinus, anemia, anxiety-new, arthritis/joint pain, back pain, blood in urine, breast changes/lumps, change in vision, confusion, cough, coughing up blood, depression-new, fainting, fatigue, fever, headaches-new, hearing problems, heart murmur, heart rhythm changes, itching, menstrual pain, muscle pains/cramps, night sweats, nosebleeds, pregnancy symptoms, shortness of breath, skin rash, sleeping problems, sore throat, swelling of feet/legs, swollen lymph glands, thirst - excessive , urination - excessive , urination changes/pain, urine leakage, vision changes, and voice change.     Vital Signs:  Patient Profile:   60 Years Old Female Height:     64  inches Weight:      121 pounds BMI:     20.84 Pulse rate:   78 /  minute Pulse rhythm:   regular BP sitting:   94 / 60  (right arm)  Vitals Entered By: Merri Ray CMA (January 04, 2009 2:12 PM)                  Physical Exam  General:     Well developed, well nourished, no acute distress. Head:     Normocephalic and atraumatic. Eyes:     Conjunctiva pink, no icterus.  Mouth:     No oral lesions. Tongue moist.  Neck:     Supple; no masses or thyromegaly. Lungs:     Clear throughout to auscultation. Heart:     Regular rate and rhythm; no murmurs, rubs,  or bruits. Abdomen:     Abdomen soft, nontender, nondistended. No obvious masses or hepatomegaly. No obvious hernias. Normal bowel sounds.  Msk:     Symmetrical with no gross deformities. Normal posture. Extremities:     No clubbing, cyanosis, edema or deformities noted. Neurologic:     Alert and  oriented x4;  grossly normal neurologically. Skin:     Intact without significant lesions or rashes. Cervical Nodes:     No significant cervical adenopathy. Psych:     Alert and cooperative. Normal mood and affect.    Impression & Recommendations:  Problem # 1:  GERD (ICD-530.81) No Barrett's seen on EGD 2005 but patient had moderate esophageal inflammation at the time. Takes daily PPI. See #2. Orders: EGD (EGD)   Problem # 2:  ABDOMINAL PAIN-EPIGASTRIC (ICD-789.06) Constant burning pain associated with nausea and vomiting. Patient taking narcotics since September for back surgery. Could have gastroparesis related to narcotics. Gastroparesis can exacerbate GERD and epigastric pain maybe secondary to distal esophagitis. Doubt biliary source given constant nature of pain. Advised patient to increase Nexium to two times a day. Will provide her with some Phenergan to use as needed. The patient will be scheduled for an EGD with biopsies ( if indicated).  The risks and benefits of the procedure, as well as alternatives  were discussed with the patient and she agrees to proceed.   Problem # 3:  IRRITABLE BOWEL SYNDROME (ICD-564.1) Diarrhea predominant. Not currently problematic.  Problem # 4:  FAMILY HX COLON CANCER (ICD-V16.0) [facility] to [facility]'s [facility], brother diagnosed with CRC recently. Patient's last colonoscopy Feb. 2006. Patient's next screening colonoscopy to be scheduled by Dr. [name].   Patient Instructions: 1)  Copy Sent To: Dr Alroy Dust 2)  We sent her Phenergan 25 mg to you pharmacy, [facility] [address]  3)  Scheduled your Endoscopy with Dr [name] on Tues 01-08-2009 @ 1:30. Please arrive @ 112:30 to our 4 th floor. 4)  Your instructions have been given to you. 5)  Increase your protonix to two times a day 6)   30 min prior to breakfast and 30 min prior to dinner.    Prescriptions: PROMETHAZINE HCL 25 MG TABS (PROMETHAZINE HCL) Take 1 tab every 4 hours as needed nausea  #30 x 0   Entered by:   Lowry Ram CMA   Authorized by:   Willette Cluster NP   Signed by:   Lowry Ram CMA on 01/04/2009   Method used:   [facility] to        [facility]  [address] [phone]* (retail)       [address]       [place], [place]  [address]  Ph: [phone] or [phone]       Fax: [phone]   RxID:   [id number]

## 2011-01-27 NOTE — Assessment & Plan Note (Signed)
Summary: ABD. PAIN  F/U ENDO AND CT          [facility]    History of Present Illness Visit Type: follow up Primary GI MD: Melvia Heaps MD [facility] Primary Provider: Lalla Brothers Requesting Provider: n/a Chief Complaint: abdominal pain  follow-up CT and ENDO History of Present Illness:   Denise King has returned for further evaluation of her abdominal pain.  Upper endoscopy was normal.  CT did not  demonstrate any abnormalities to explain her pain.  She continues to complain of burning midepigastric pain.  It  may be slightly exacerbated postprandially.  She has had frequent episodes of vomiting though no vomiting over the past week.  While she has been taking Vicodin chronically for at least 6 months, symptoms have only occurred over the past several weeks.   GI Review of Systems    Reports abdominal pain, acid reflux, belching, and  nausea.     Location of  Abdominal pain: upper abdomen.    Denies bloating, chest pain, dysphagia with liquids, dysphagia with solids, heartburn, loss of appetite, vomiting, vomiting blood, weight loss, and  weight gain.        Denies anal fissure, black tarry stools, change in bowel habit, constipation, diarrhea, diverticulosis, fecal incontinence, heme positive stool, hemorrhoids, irritable bowel syndrome, jaundice, light color stool, liver problems, rectal bleeding, and  rectal pain.     Updated Prior Medication List: LASIX 20 MG  TABS (FUROSEMIDE) 1 by mouth once daily as needed leg swelling K-TABS 10 MEQ  TBCR (POTASSIUM CHLORIDE) 1 by mouth once daily  with Lasix as needed for leg swelling. PROTONIX 40 MG  TBEC (PANTOPRAZOLE SODIUM) Take 1 tablet by mouth once a day LEVBID 0.375 MG  TB12 (HYOSCYAMINE SULFATE) 1 tab by mouth two times a day as needed for abd cramping... PROMETHAZINE HCL 25 MG  TABS (PROMETHAZINE HCL) 1 tab by mouth Q6H as needed for nausea... EVISTA 60 MG  TABS (RALOXIFENE HCL) Take 1 tablet by mouth once a day VITAMIN D 10175 UNIT   CAPS (ERGOCALCIFEROL) 1 by mouth EVERY WEEK MULTIVITAMINS   TABS (MULTIPLE VITAMIN) 1 tab daily... ALPRAZOLAM 0.5 MG  TABS (ALPRAZOLAM) 1 at bedtime OXYCODONE-ACETAMINOPHEN 10-325 MG TABS (OXYCODONE-ACETAMINOPHEN) 1 tab every 4-6 hours as needed for pain  Current Allergies (reviewed today): ! SULFA ! * TEQUIN ! LIPITOR (ATORVASTATIN CALCIUM)  Past Medical History:    Reviewed history from 12/11/2008 and no changes required:       VERTIGO (ICD-780.4) - Hosp 8/08 w/ neg eval and eventually referred to Neurology w/ nothing found; Rx'd w/ Antivert, Phenergan, vestib therapy...              BRONCHITIS, RECURRENT (ICD-491.9) - 1ppd smoker w/ hx recurrent bronchitis... counselled to quit.              REFLUX ESOPHAGITIS (ICD-530.11) - EGD 9/05 by DrPerry showed mod severe reflux esoph w/ erosions, Rx PROTONIX regularly w/ good control, but states she needs it Bid...              DIVERTICULOSIS OF COLON (ICD-562.10), &  [name] (ICD-564.1) - she takes LEVBID Bidprn...              COLONIC POLYPS (ICD-211.3) - Last colonoscopy 2/06 by [name] showed Divertic, otherw neg; f/u 71yrs w/ +fam hx colon ca.              CALCULUS, KIDNEY (ICD-592.0) - Hx of Kidney Stones & small renal cysts on CT  scan & sonar - followed by DrWrenn.              BACK PAIN, LUMBAR (ICD-724.2) - eval and rx by DrVoytek w/ DDD and no surg possible... she's had series of epidural shots from DrStrong without relief... being referred to a chronic pain clinic...--s/p back surgery with Dr. Noel Gerold              OSTEOPENIA (ICD-733.90) - she is on EVISTA, Ca++, Vits... w/ BMD per GYN              HEADACHE (ICD-784.0)              ANXIETY (ICD-300.00) - she is on LEXAPRO 10mg /d, and XANAX 0.5mg - 2tabQhs...              HEALTH MAINTENANCE:  GYN= DrHolland & PAP, Mammogram, etc... all up-to-date; she is due a BMD in 1/10; and colon 10/11...       H1N1 December 11, 2008                         Past Surgical History:    Reviewed  history from 01/04/2009 and no changes required:       Appendectomy       Tubal ligation - DrHolland       Back Surgery   Family History:    Reviewed history from 12/06/2007 and no changes required:       Father:  died age 18 w/ colon ca & lung mets; hx prostate ca aswell.       Mother:  [name], age 72 w/ hx heart dz & prev MVR; also has DJD.       3 Sibs:  2Bro & 1Sis - 1Bro w/ colon ca recently dx'd & awaiting surgin [place]; sis w/ hx skin squam cell ca's.  Social History:    Reviewed history from 12/06/2007 and no changes required:       Married to husb for 30 yrs         4 children - all in good health       Works for [employer]       She has BS degreein Horiculture from [place].              +Smoker - starting in 20's til present, up to 1&1/2 ppd for a 30+ pack year hx.       Social Etoh, no drugs.    Review of Systems       The patient complains of back pain.     Vital Signs:  Patient Profile:   60 Years Old Female Height:     64 inches Weight:      119.25 pounds BMI:     20.54 BSA:     1.57 Pulse rate:   96 / minute Pulse rhythm:   regular BP sitting:   108 / 78  (left arm)  Vitals Entered By: Milford Cage CMA (January 14, 2009 3:15 PM)                    Impression & Recommendations:  Problem # 1:  ABDOMINAL PAIN-EPIGASTRIC (ICD-789.06) Symptoms could be due to her narcotics are causing gastroparesis.  Alternatively, she could have primary gastroparesis.  Recommendations #1 gastric contents can  Problem # 2:  FAMILY HX COLON CANCER (ICD-V16.0) Plan followup colonoscopy in 2011      Appended Document: Orders Update  Clinical Lists Changes  Orders: Added new Test order of Gastric Emptying Scan (GES) - Signed

## 2011-01-27 NOTE — Progress Notes (Signed)
Summary: update   Phone Note Call from Patient Call back at [phone]   Caller: Patient Call For: Mike Gip Reason for Call: Talk to Nurse Summary of Call: pt reports she is "not feeling any better even after being on the abx" and doesnt "know what to do" Initial call taken by: Vallarie Mare,  February 03, 2010 8:14 AM  Follow-up for Phone Call        Called and had to leave message on pt's ans machine.  Asked pt to call me back. Follow-up by: Joselyn Glassman,  February 03, 2010 9:57 AM

## 2011-01-27 NOTE — Progress Notes (Signed)
Summary: chantix rx  Phone Note Call from Patient Call back at [phone]   Caller: Patient Call For: nadel Reason for Call: Talk to Nurse Summary of Call: Sn forgot to give pt her RX for Chantix.  Can this be called in? [facility] [address] Initial call taken by: Eugene Gavia,  February 05, 2010 11:50 AM  Follow-up for Phone Call        med not on pt's med list.  SN-please advise if ok to send rx.  Thanks! Crystal Jones RN  February 05, 2010 11:56 AM    per SN---ok to call in chantix  #1 starter pack  take as directed  and ok for refills of the continuation pack.  thanks Randell Loop CMA  February 05, 2010 3:00 PM   Additional Follow-up for Phone Call Additional follow up Details #1::        [facility] Gweneth Dimitri RN  February 05, 2010 3:13 PM  spoke with pt.  Pt informed chantix starter pack has been sent to [facility] and to call back once she needs the continuation packs-she verbalized understanding. Additional Follow-up by: Gweneth Dimitri RN,  February 05, 2010 3:20 PM    New/Updated Medications: CHANTIX STARTING MONTH PAK 0.5 MG X 11 & 1 MG X 42 TABS (VARENICLINE TARTRATE) take as directed Prescriptions: CHANTIX STARTING MONTH PAK 0.5 MG X 11 & 1 MG X 42 TABS (VARENICLINE TARTRATE) take as directed  #1start pak x 0   Entered by:   Gweneth Dimitri RN   Authorized by:   Michele Mcalpine MD   Signed by:   Gweneth Dimitri RN on 02/05/2010   Method used:   [facility]        [facility]  [address] [phone]* (retail)       [address]       [place], [place]  [address]       Ph: [phone]       Fax: [phone]   RxID:   [id number]

## 2011-01-27 NOTE — Assessment & Plan Note (Signed)
Summary: FOLLOW UP/KLW   Chief Complaint:  5 month ROV....  History of Present Illness: 60 y/o WF here for a follow up visit... she has multiple medical problems as noted below...   ~  when last seen 3/09: 1)  FLP was abn w/ TChol 250, LDL 156... Simvastatin 20mg /d started, but she reports intolerant... therefore she went on low chol/ low fat diet and repeat FLP 5/09 showed TChol 205, LDL 102 = much improved, therefore continue diet efforts... and 2) VitD level was low at 12 (30-90) so VitD 50K per week was started... tol well, continue same Rx for now...   ~  seen 4/09 for edema- Rx Lasix with some improvement... venous dopplers that were negative for DVT...  Lexapro was not working & changed to Cymbalta (does feel some better)...  traveled to [facility], stood up and left leg gave away with subsequent fall into bench/ newspapaer stand striking back and hip- went to [facility] ER & reports xray negative... initiated w/u w/ DrVoytek, Ortho [facility] Pain Management, DrKirstens...    ~  eval DrVoytek w/ DDD and shots w/o help... she's had MRI and discogram w/ review by DrCohen and surg is planned at [facility] in Oct... she tells me she will need to be off work an additional 3 months after the surg... she prev saw DrNudelman who didn't think surg would help her- but this was before the discogram... I have encouraged her to f/u w/ neurosurg for their review of the additional studies before she proceeds...   ~  seen 8/09 w/ acute URI/ gastroenteritis symptoms- CXR was clear & labs normal... Rx w/ Fluids, rest, Tylenol, Levsin, Phenergan, etc... she was out of work 2+weeks but finally better...   Current Problem List:  VERTIGO (ICD-780.4) - Hosp 8/08 w/ neg eval and eventually referred to Neurology w/ nothing found; Rx'd w/ Antivert, Phenergan, vestib therapy...  BRONCHITIS, RECURRENT (ICD-491.9) - 1ppd smoker w/ hx recurrent bronchitis... counselled to quit.  REFLUX ESOPHAGITIS (ICD-530.11) - EGD 9/05 by  DrPerry showed mod severe reflux esoph w/ erosions, Rx PROTONIX regularly w/ good control, but states she needs it Bid...  DIVERTICULOSIS OF COLON (ICD-562.10), &  [name] (ICD-564.1) - she takes LEVBID Bidprn...  COLONIC POLYPS (ICD-211.3) - Last colonoscopy 2/06 by [name] showed Divertic, otherw neg; f/u 88yrs w/ +fam hx colon ca.  CALCULUS, KIDNEY (ICD-592.0) - Hx of Kidney Stones & small renal cysts on CT scan & sonar - followed by DrWrenn.  BACK PAIN, LUMBAR (ICD-724.2) - eval and rx by DrVoytek w/ DDD and no surg possible... she's had series of epidural shots from DrStrong without relief... being referred to a chronic pain clinic...  OSTEOPENIA (ICD-733.90) - she is on EVISTA, Ca++, Vits... w/ BMD per GYN  HEADACHE (ICD-784.0)  ANXIETY (ICD-300.00) - she is on LEXAPRO 10mg /d, and XANAX 0.5mg - 2tabQhs...  HEALTH MAINTENANCE:  GYN= DrHolland & PAP, Mammogram, etc... all up-to-date; she is due a BMD in 1/10; and colon 10/11...      Current Allergies (reviewed today): ! SULFA ! * TEQUIN ! LIPITOR (ATORVASTATIN CALCIUM)  Past Medical History:        BRONCHITIS, RECURRENT (ICD-491.9)    CIGARETTE SMOKER (ICD-305.1)    VENOUS INSUFFICIENCY (ICD-459.81)    HYPERCHOLESTEROLEMIA (ICD-272.0)    REFLUX ESOPHAGITIS (ICD-530.11)    DIVERTICULOSIS OF COLON (ICD-562.10)    IRRITABLE BOWEL SYNDROME (ICD-564.1)    COLONIC POLYPS (ICD-211.3)    CALCULUS, KIDNEY (ICD-592.0)    BACK PAIN (ICD-724.5)  OSTEOPENIA (ICD-733.90)    HEADACHE (ICD-784.0)    VERTIGO (ICD-780.4)    DYSTHYMIA (ICD-300.4)       Family History:    Reviewed history from 12/06/2007 and no changes required:       Father:  died age 85 w/ colon ca & lung mets; hx prostate ca aswell.       Mother:  [name], age 55 w/ hx heart dz & prev MVR; also has DJD.       3 Sibs:  2Bro & 1Sis - 1Bro w/ colon ca recently dx'd & awaiting surgin [place]; sis w/ hx skin squam cell ca's.  Social History:    Reviewed history from  12/06/2007 and no changes required:       Married to husb for 30 yrs         4 children - all in good health       Works for [employer]       She has BS degreein Horiculture from [place].              +Smoker - starting in 20's til present, up to 1&1/2 ppd for a 30+ pack year hx.       Social Etoh, no drugs.    Review of Systems  The patient denies anorexia, fever, weight loss, weight gain, vision loss, decreased hearing, hoarseness, chest pain, syncope, dyspnea on exertion, peripheral edema, prolonged cough, headaches, hemoptysis, abdominal pain, melena, hematochezia, severe indigestion/heartburn, hematuria, incontinence, muscle weakness, suspicious skin lesions, transient blindness, difficulty walking, depression, unusual weight change, abnormal bleeding, enlarged lymph nodes, and angioedema.     Vital Signs:  Patient Profile:   60 Years Old Female Weight:      126.38 pounds O2 Sat:      99 % O2 treatment:    Oxygen Temp:     97.5 degrees F oral Pulse rate:   90 / minute BP sitting:   120 / 78  (right arm)  Vitals Entered By: Marijo File CMA (August 10, 2008 11:41 AM)                 Physical Exam  WD, WN, 60 y/o WF in NAD... GENERAL:  Alert & oriented; pleasant & cooperative... HEENT:  [place]/AT, EOM-wnl, PERRLA,  EACs-clear, TMs-wnl, NOSE-clear, THROAT-clear & wnl. NECK:  Supple w/ fairROM; no JVD; normal carotid impulses w/o bruits; no thyromegaly or nodules palpated; no lymphadenopathy. CHEST:  Clear to P & A; without wheezes/ rales/ or rhonchi heard... HEART:  Regular Rhythm; without murmurs/ rubs/ or gallops detected... ABDOMEN:  Soft & nontender; normal bowel sounds; no organomegaly or masses palpated... EXT: without deformities, mild arthritic changes; no varicose veins/ +venous insuffic/ no edema. NEURO:  CN's intact; no focal neuro deficits... DERM:  No lesions noted; no rash etc...      Impression & Recommendations:  Problem # 1:   BRONCHITIS, RECURRENT (ICD-491.9) Improved w/ resolution of recent symptoms... she must quit smoking before any planned back surg... she didn't like Chantix in the past- rec nicotine replacement therapy, nerve pills, counselling, etc...  Problem # 2:  VENOUS INSUFFICIENCY (ICD-459.81) We discussed no salt, elevation, TED's etc...  Problem # 3:  HYPERCHOLESTEROLEMIA (ICD-272.0) She was intol to Simvastatin, then improved her numbers on diet alone- she is motivated to continue diet Rx...  Problem # 4:  DIVERTICULOSIS OF COLON (ICD-562.10) GI is back to normal function after recent viral illness...  Problem # 5:  BACK PAIN (ICD-724.5) She has  seen Ortho (Voytek & Cohen), Pain (Kirstens), Neurosurg [facility])... we discussed her predicament and she is not sure what to do... I encouraged her to seek as many f/u opinions as she required before making her final decision... Her updated medication list for this problem includes:    Percocet 10-650 Mg Tabs (Oxycodone-acetaminophen) ..... Use as directed when needed    Vicodin 5-500 Mg Tabs (Hydrocodone-acetaminophen) .Marland Kitchen... 1 tab by mouth q6h as needed for severe pain...   Problem # 6:  DYSTHYMIA (ICD-300.4) She is doing better on the Cymbalta at present...  Complete Medication List: 1)  Lasix 20 Mg Tabs (Furosemide) .Marland Kitchen.. 1 by mouth once daily as needed leg swelling 2)  K-tabs 10 Meq Tbcr (Potassium chloride) .Marland Kitchen.. 1 by mouth once daily  with lasix as needed for leg swelling. 3)  Protonix 40 Mg Tbec (Pantoprazole sodium) .... Take 1 tablet by mouth once a day 4)  Levbid 0.375 Mg Tb12 (Hyoscyamine sulfate) .Marland Kitchen.. 1 tab by mouth two times a day as needed for abd cramping... 5)  Promethazine Hcl 25 Mg Tabs (Promethazine hcl) .Marland Kitchen.. 1 tab by mouth q6h as needed for nausea.Marland KitchenMarland Kitchen 6)  Evista 60 Mg Tabs (Raloxifene hcl) .... Take 1 tablet by mouth once a day 7)  Vitamin D 16109 Unit Caps (Ergocalciferol) .Marland Kitchen.. 1 by mouth every week 8)  Multivitamins Tabs (Multiple  vitamin) .Marland Kitchen.. 1 tab daily.Marland KitchenMarland Kitchen 9)  Cymbalta 30 Mg Cpep (Duloxetine hcl) .Marland Kitchen.. 1 by mouth once daily 10)  Alprazolam 0.5 Mg Tabs (Alprazolam) .... Take 1 tab by mouth at bedtime 11)  Percocet 10-650 Mg Tabs (Oxycodone-acetaminophen) .... Use as directed when needed 12)  Vicodin 5-500 Mg Tabs (Hydrocodone-acetaminophen) .Marland Kitchen.. 1 tab by mouth q6h as needed for severe pain...   Patient Instructions: 1)  Today we updated your med list- see below.... 2)  Good luck w/ the up-coming back surgery.Marland KitchenMarland Kitchen  3)  Please let me know if there is anything that we can do for you to help in the peri-operative period.Marland KitchenMarland Kitchen 4)  Try the Nicotine patch/ gum/ etc... and let me know if you want to try the Chantix again.Marland KitchenMarland Kitchen 5)  Please schedule a follow-up appointment in 6 months, sooner as needed.   ]

## 2011-01-27 NOTE — Progress Notes (Signed)
Summary: UTI  Phone Note Call from Patient   Caller: Patient Call For: NADEL Summary of Call: PT HAS FEVER THINK SHE MAY HAVE UTI CAN SHE SEND A URINE SAMPLE IN ? Initial call taken by: Rickard Patience,  November 05, 2008 10:50 AM  Follow-up for Phone Call        Pt had back surgery with fusion by dr cowen on 10-01-08.  Pt has had fever of 100 to 101 since surgery. dr cowen's office siad to call her primary md. pt has mild burning with urination and frequent urination.  What do you suggest? Follow-up by: Abigail Miyamoto RN,  November 05, 2008 10:58 AM  Additional Follow-up for Phone Call Additional follow up Details #1::        she can come in for udip and C&S please.  thanks, Marijo File CMA  November 05, 2008 11:02 AM     Additional Follow-up for Phone Call Additional follow up Details #2::    Order put in computer.  Pt aware to go to labs to leave urine specimen. Follow-up by: Abigail Miyamoto RN,  November 05, 2008 11:36 AM

## 2011-01-27 NOTE — Letter (Signed)
Summary: NP Eval/[facility]  NP Eval/[facility]   Imported By: Sherian Rein 03/26/2010 13:14:04  _____________________________________________________________________  External Attachment:    Type:   Image     Comment:   External Document

## 2011-01-27 NOTE — Assessment & Plan Note (Signed)
Summary: B12 AND BP CHECK ///kp   Primary Provider/Referring Provider:  Lorin Picket Nadel,MD   History of Present Illness: October 11, 2009 60yoF patient of Dr Kriste Basque- acute visit, Hypertensive and lightheaded in allergy lab, coming for B12 shot.  Complains of chronic back pain since surgery 1 year ago- working with Pain clinic. "2 sick daughtesrs and feeling overloaded with volunteer obligation for tomorrow"  No hx of HTN. Takes lasix very occasionally for fluid retention. No headache or confusion. Feeling berter after lying down. 166/98 initially, then 142/86 on repeat.    Allergies: 1)  ! Sulfa 2)  ! * Tequin 3)  ! Lipitor (Atorvastatin Calcium)  Past History:  Past Medical History: Last updated: 08/06/2009 BRONCHITIS, RECURRENT (ICD-491.9) CIGARETTE SMOKER (ICD-305.1) VENOUS INSUFFICIENCY (ICD-459.81) HYPERCHOLESTEROLEMIA (ICD-272.0) REFLUX ESOPHAGITIS (ICD-530.11) DIVERTICULOSIS OF COLON (ICD-562.10) IRRITABLE BOWEL SYNDROME (ICD-564.1) COLONIC POLYPS (ICD-211.3) CALCULUS, KIDNEY (ICD-592.0) BACK PAIN (ICD-724.5) OSTEOPENIA (ICD-733.90) HEADACHE (ICD-784.0) VERTIGO (ICD-780.4) DYSTHYMIA (ICD-300.4)  Past Surgical History: Last updated: 08/06/2009 Appendectomy Tubal ligation - DrHolland Back Surgery- 10/09 by DrCohen  Family History: Last updated: 12/28/2007 Father:  died age 74 w/ colon ca & lung mets; hx prostate ca aswell. Mother:  [name], age 24 w/ hx heart dz & prev MVR; also has DJD. 3 Sibs:  2Bro & 1Sis - 1Bro w/ colon ca recently dx'd & awaiting surgin [place]; sis w/ hx skin squam cell ca's.  Social History: Last updated: 12/28/07 Married to husb for 30 yrs   4 children - all in good health Works for [employer] Rec [employer] She has BS degreein Horiculture from [place].  +Smoker - starting in 20's til present, up to 1&1/2 ppd for a 30+ pack year hx. Social Etoh, no drugs.  Risk Factors: Smoking Status: current (10/07/2007) Packs/Day: 1.0 (10/02/2009)  Review of Systems      See HPI  The patient denies syncope, headaches, hemoptysis, melena, suspicious skin lesions, transient blindness, and depression.    Physical Exam  Additional Exam:  General: A/Ox3; pleasant and cooperative,lying down on right side- able to sit up, then climb down from exam table. SKIN: no rash, lesions NODES: no lymphadenopathy HEENT: [place]/AT, EOM- WNL, Conjuctivae- clear, PERRLASpeech clear NECK: Supple w/ fair ROM, JVD- none, normal carotid impulses w/o bruits Thyroid CHEST: Clear to P&A HEART: RRR, no m/g/r heard ABDOMEN:  CZY:SAYT, nl pulses, no edema  NEURO: Grossly intact to observation      Impression & Recommendations:  Problem # 1:  HYPERTENSION (ICD-401.9) Assessment Unchanged Dizzy light headed/ Stress with situational hypertension. Suggested return home, take it easy and make f/u with Dr Kriste Basque to recheck BP.  Her updated medication list for this problem includes:    Lasix 20 Mg Tabs (Furosemide) .Marland Kitchen... 1 by mouth once daily as needed leg swelling  Other Orders: Est. Patient Level II (01601) Admin of Therapeutic Inj  intramuscular or subcutaneous (09323) Vit B12 1000 mcg (F5732)  Patient Instructions: 1)  You need to relax some this afternoon. Take it easy driving home. 2)  Make appointment to follow-up with Dr Kriste Basque in the next week or two for BP check.   Medication Administration  Injection # 1:    Medication: Vit B12 1000 mcg    Diagnosis: VITAMIN B12 DEFICIENCY (ICD-266.2)    Route: SQ    Site: L deltoid    Exp Date: 07/2010    Lot #: [id number]    Mfr: [employer]    Comments: Injection given by Dimas Millin in allergy lab.  Patient tolerated injection without complications  Orders Added: 1)  Est. Patient Level II [47829] 2)  Admin of Therapeutic Inj  intramuscular or subcutaneous [96372] 3)  Vit B12 1000 mcg [J3420]

## 2011-01-27 NOTE — Progress Notes (Signed)
Summary: Med refill   Phone Note Refill Request Message from:  Fax from Pharmacy on November 13, 2010 10:54 AM  Refills Requested: Medication #1:  ALPRAZOLAM 0.5 MG TABS take one to 2 tabs q.6 h. p.r.n.Marland Kitchen   Dosage confirmed as above?Dosage Confirmed   Brand Name Necessary? No   Supply Requested: 1 month  Method Requested: Fax to Local Pharmacy Initial call taken by: Merri Ray CMA Duncan Dull),  November 13, 2010 10:54 AM

## 2011-01-27 NOTE — Medication Information (Signed)
Summary: Tax adviser   Imported By: Valinda Hoar 12/17/2009 08:39:25  _____________________________________________________________________  External Attachment:    Type:   Image     Comment:   External Document

## 2011-01-27 NOTE — Letter (Signed)
Summary: Results Letter  [facility]  [address] [place], [place] [address]   Phone: [phone]  Fax: [phone]        January 14, 2009 MRN: [id number]    Denise King [address] [place], [place]  [address]    Dear Denise King,  Your CT results did not show any remarkable findings.  Please continue with the recommendations previously discussed.  Should you have any further questions or immediate concers, feel free to contact me.  Sincerely,  Barbette Hair. [name], M.D., [facility]          Sincerely,  Louis Meckel MD  This letter has been electronically signed by your physician.  Appended Document: Results Letter letter mailed

## 2011-01-27 NOTE — Progress Notes (Signed)
Summary: CT AND OV SCHEDULED   ---- Converted from flag ---- ---- 01/08/2009 3:41 PM, Louis Meckel MD wrote: abdominal pain  ---- 01/08/2009 2:35 PM, Laureen Ochs LPN wrote: O.K. What is her diagnosis? Deb  ---- 01/08/2009 1:55 PM, Louis Meckel MD wrote: please schedule CT of abd/pelvis then OV ------------------------------  Phone Note Outgoing Call   Call placed by: Laureen Ochs LPN,  January 09, 2009 11:40 AM Call placed to: Patient Summary of Call: Pt. scheduled for a CT at [facility] on 01-11-09 at 10am and for return appointment with Dr.Sitara Cashwell for 01-14-09 at 3pm. Pt. instructed to call back as needed.  Initial call taken by: Laureen Ochs LPN,  January 09, 2009 11:42 AM

## 2011-01-27 NOTE — Progress Notes (Signed)
Summary: Lab results  Phone Note Call from Patient Call back at cell [phone]   Call For: Denise Gip, PA Reason for Call: Lab or Test Results Initial call taken by: Leanor Kail [facility],  September 05, 2010 3:24 PM  Follow-up for Phone Call        Notified pt that provider has not reviewed labs and we will call her next week with results and plan.  Pt states she is not going out of town as planned this weekend as she does not want to be far from home. Follow-up by: Francee Piccolo CMA [facility]ull),  September 05, 2010 5:03 PM

## 2011-01-27 NOTE — Assessment & Plan Note (Signed)
Summary: WORSENING ABD. PAIN,DIARRHEA       (DR.KAPLAN PT.)  Denise King    History of Present Illness Visit Type: Follow-up Visit Primary GI MD: Melvia Heaps MD [facility] Primary Provider: Alroy Dust, MD Requesting Provider: n/a Chief Complaint: left side abd pain and diarrhea  History of Present Illness:   Denise King  59 YO FEMALE KNOWN TO DR. KAPLAN. LAST COLON 2006 WITH DIVERTICULOSIS..SHE WAS TREATED FOR DIVERTICULITIS IN 2/11. SHE CALLED THE OFFICE EARLIER THIS WEEK AND WAS STARTED ON CIPRO AND FLAGYL BUT DOES NOT FEEL ANY BETTER. SHE FEELS BEST EARLY IN THE MORNING AND SAYS THE PAIN GETS WORSE AS THE DAY GOES ON , AND KEEPS HER UP AT NIGHT. NO FEVER,+NAUSEA,LOOSE URGENT STOOLS OVER THE PAST COUPLE MONTHS. HER CURRENT PAIN STARTED 9/4, AND HAS BEEN WORSE WITH by mouth INTAKE . SHE ADMITS TO  ALOT OF ONGOING STRESS AND WONDERS IF THAT IS CONTRIBUTING. 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    GI Review of Systems    Reports abdominal pain.     Location of  Abdominal pain: left side.    Denies acid reflux, belching, bloating, chest pain, dysphagia with liquids, dysphagia with solids, heartburn, loss of appetite, nausea, vomiting, vomiting blood, weight loss, and  weight gain.      Reports diarrhea.     Denies anal fissure, black tarry stools, change in bowel habit, constipation, diverticulosis, fecal incontinence, heme positive stool, hemorrhoids, irritable bowel syndrome, jaundice, light color stool, liver problems, rectal bleeding, and  rectal pain.    Current Medications (verified): 1)  Lasix 20 Mg  Tabs (Furosemide) .Marland Kitchen.. 1 By Mouth Once Daily As Needed Leg Swelling 2)  K-Tabs 10 Meq  Tbcr (Potassium Chloride) .Marland Kitchen.. 1 By Mouth Once Daily  With Lasix As Needed For Leg Swelling. 3)  Protonix 40 Mg Tbec (Pantoprazole Sodium) .... Take 1 Tablet By Mouth Once A Day 4)  Promethazine Hcl 25 Mg  Tabs (Promethazine Hcl) .Marland Kitchen.. 1 Tab By Mouth Q6h As Needed  For Nausea.Marland KitchenMarland Kitchen 5)  Cymbalta 60 Mg Cpep (Duloxetine Hcl) .... Take 1 Tab By Mouth Once Daily.Marland KitchenMarland Kitchen 6)  Opana Er 5 Mg Xr12h-Tab (Oxymorphone Hcl) .... Take 1-2 Tablets By Mouth Every 6 Hours As Needed For Breakthough Pain 7)  Multivitamins   Tabs (Multiple Vitamin) .Marland Kitchen.. 1 Tab Daily.Marland KitchenMarland Kitchen 8)  Vitamin D 1000 Unit Tabs (Cholecalciferol) .... Take 1 Tab By Mouth Once Daily.Marland KitchenMarland Kitchen 9)  Percocet 7.5-325 Mg Tabs (Oxycodone-Acetaminophen) .... One Tablet By Mouth Four Times Daily 10)  Hyoscyamine Sulfate Cr 0.375 Mg Xr12h-Tab (Hyoscyamine Sulfate) .... One Tablet By Mouth Two Times A Day As Needed For Cramps  Allergies (verified): 1)  ! Sulfa 2)  ! * Tequin 3)  ! Lipitor (Atorvastatin Calcium)  Past History:  Past Medical History: Reviewed history from 02/05/2010 and no changes required.  BRONCHITIS, RECURRENT (ICD-491.9) CIGARETTE SMOKER (ICD-305.1) VENOUS INSUFFICIENCY (ICD-459.81) GERD (ICD-530.81) REFLUX ESOPHAGITIS (ICD-530.11) DIVERTICULOSIS OF COLON (ICD-562.10) IRRITABLE BOWEL SYNDROME (ICD-564.1) COLONIC POLYPS (ICD-211.3) CALCULUS, KIDNEY (ICD-592.0) BACK PAIN (ICD-724.5) CHRONIC PAIN SYNDROME (ICD-338.4) OSTEOPENIA (ICD-733.90) HEADACHE (ICD-784.0) VERTIGO (ICD-780.4) DYSTHYMIA (ICD-300.4) DEPRESSION (ICD-311) VITAMIN B12 DEFICIENCY (ICD-266.2)  Past Surgical History: Reviewed history from 02/05/2010 and no changes required. Appendectomy Tubal ligation - DrHolland Back Surgery- 10/09 by DrCohen Tonsillectomy  Family History: Reviewed history from 01/27/2010 and no changes required. Father:  died age 80 w/ colon ca & lung mets; hx prostate ca aswell. Mother:  Denise King, age 43 w/  hx heart dz & prev MVR; also has DJD. 3 Sibs:  2Bro & 1Sis -  1Bro w/ colon ca recently dx'd & awaiting surgin [place];  sis w/ hx skin squam cell ca's. Family History of Colon Polyps: Brother  Social History: Reviewed history from 01/27/2010 and no changes required. 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 +Smoker - starting in 20's til present, up to 1&1/2 ppd for a 30+ pack year hx. no drugs. Alcohol Use - no Illicit Drug Use - no  Review of Systems       The patient complains of fatigue and urination - excessive.  The patient denies allergy/sinus, anemia, anxiety-new, arthritis/joint pain, back pain, blood in urine, breast changes/lumps, change in vision, confusion, cough, coughing up blood, depression-new, fainting, fever, headaches-new, hearing problems, heart murmur, heart rhythm changes, itching, menstrual pain, muscle pains/cramps, night sweats, nosebleeds, pregnancy symptoms, shortness of breath, skin rash, sleeping problems, sore throat, swelling of feet/legs, swollen lymph glands, thirst - excessive , urination - excessive , urination changes/pain, urine leakage, vision changes, and voice change.         SEE HPI  Vital Signs:  Patient profile:   60 year old female Height:      64 inches Weight:      126 pounds BMI:     21.71 BSA:     1.61 Temp:     98.6 degrees F oral Pulse rate:   88 / minute Pulse rhythm:   regular BP sitting:   122 / 74  (left arm) Cuff size:   regular  Vitals Entered By: Ok Anis CMA (September 04, 2010 3:28 PM)  Physical Exam  General:  Well developed, well nourished, no acute distress. Head:  Normocephalic and atraumatic. Eyes:  PERRLA, no icterus. Lungs:  Clear throughout to auscultation. Heart:  Regular rate and rhythm; no murmurs, rubs,  or bruits. Abdomen:  SOFT, MILD TENDERNESS LEFT MID, AND LOWER QUADRANT, NO GUARDING , NO  REBOUND, NO MASS OR HSM,B Rectal:  NOT DONE Extremities:  No clubbing, cyanosis, edema or deformities noted. Neurologic:  Alert and  oriented x4;  grossly normal neurologically. Psych:  Alert and cooperative. Normal mood and affect.anxious.     Impression & Recommendations:  Problem # 1:  ABDOMINAL PAIN, LEFT LOWER QUADRANT (ICD-789.04) Assessment New 59 YO FEMALE WITH LEFT ABDOMINAL PAIN, HX OF DIVERTICULITIS, AND [name]. PAIN ASSOCIATED WITH NAUSEA,ALTERNATING BOWEL HABITS. PT ON CIPRO AND FLAGYL  X 48 HOURS WITH PERSISTENT PAIN.  SUSPECT  PRIMARILY [name] EXACERBATION SECONDARY TO STRESS/ANXIETY. SHE MAY HAVE DIVERTICULITIS AS WELL. CHECK LABS AS BELOW CONTINUE CIPR AND FLAGYL X 10 DAYS. ADD BENTYL 10 MG 3 X DAILY  CONSIDER CT IF LEUKOCYTOSIS ETC. FOLLOW UP WITH DR. KAPLAN IN 3 WEEKS. Orders: TLB-BMP (Basic Metabolic Panel-BMET) (80048-METABOL) TLB-CRP-High Sensitivity (C-Reactive Protein) (86140-FCRP) TLB-CBC Platelet - w/Differential (85025-CBCD) TLB-Udip w/ Micro (81001-URINE)  Problem # 2:  FAMILY HX COLON CANCER (ICD-V16.0) Assessment: Comment Only BROTHER   PT WILL NEED FOLLOW UP COLONOSCOPY THIS YEAR  Problem # 3:  CIGARETTE SMOKER (ICD-305.1) Assessment: Comment Only  Problem # 4:  GERD (ICD-530.81) Assessment: Comment Only STABLE ON PROTONIX.  Problem # 5:  CHRONIC PAIN SYNDROME (ICD-338.4) Assessment: Comment Only CHRONIC OPIOD USE  Patient Instructions: 1)  Please go to lab, basement level. 2)  Stay on Cipro and Flagyl. 3)  We sent a prescription for Bentyl ( Dicyclomine ) to [facility] [place]. 4)  Copy sent to : Alroy Dust, MD 5)  The medication list was reviewed and reconciled.  All changed / newly prescribed medications were explained.  A complete medication list was provided to the patient / caregiver. Prescriptions: DICYCLOMINE HCL 10 MG CAPS (DICYCLOMINE HCL) Take 1 tab 3-4 times daily as needed for cramping and spasms  #90 x 1   Entered by:    Lowry Ram NCMA   Authorized by:   Sammuel Cooper PA-c   Signed by:   Lowry Ram NCMA on 09/04/2010   Method used:   [facility]        [facility] (retail)       [address]       [place], [place]  [address]       Ph: [phone] or [phone]       Fax: [phone]   RxID:   [id number]

## 2011-01-27 NOTE — Assessment & Plan Note (Signed)
Summary: ROV 2 WKS - CHECK BP ///KP   Primary Provider/Referring Provider:  Lorin Picket Nadel,MD  CC:  2 week follow up for BP.  states feels that her BP is better since the pain clinic increased her pain medications..  History of Present Illness: 60 year old female with known history of [name], chronic low back pain with DDD followed by Dr. Jethro Bolus  and Hyperlipidemia    3/09: 1)  FLP was abn w/ TChol 250, LDL 156... Simvastatin 20mg /d started, but she reports intolerant... therefore she went on low chol/ low fat diet and repeat FLP 5/09 showed TChol 205, LDL 102 = much improved, therefore continue diet efforts... and 2) VitD level was low at 12 (30-90) so VitD 50K per week   04/12/08--.Recently seen for extremity edema  worse in afternoons. Given Lasix with some improvement. Sent for venous dopplers that were negative for DVT.  Also felt  Lexapro is not working, Changed to Cymbalta and does feel some better. Recently traveled [facility], stood up and left leg gave away with subsequent fall into bench/ newspapaer stand striking back and hip. Went to [facility] ER. Reports xray negative. Currently seeing ortho/neuro/pain spec for DDD/DJD, scheduled for back surgery for fusion of L4-5.   05/12/08 Recent UTI tx with cipro,    ~  seen 8/09 w/ acute URI/ gastroenteritis symptoms- CXR was clear & labs normal... Rx w/ Fluids, rest, Tylenol, Levsin, Phenergan, etc... she was out of work 2+weeks but finally better...  11/09/2008--Had back surgery on 10/01/08."vrey slow on recovery" complains still pressure in back, pain  and has been having low grade temp at night. Complains she feels somewhat depressed, sad at times, low interest, mood swings. no suicidal ideation.  --cybalta increased to 60mg  once daily   December 11, 2008--returns for follow up. Pt is doing better. starting to get stronger but still has right much back pain, seen Dr. Noel Gerold last week with steroid injection and steroid taper.Not much help. Mood is  better but concerned that her libidio is so down. Husband very upset. She is considering tapering off xanax to see if this will help.      June 03, 2009-- Pt is here for a sick visit.  Pt c/o elevated BP, tremors, difficulty sleeping at night and waking up tired  Pt states her husband c/o wife "screaming" while sleeping at night. Dr. Noel Gerold is weaning off pain meds, was up to six oxycodone daily, now weaned down 4 tabs/day. However has not been able to wean down except to 5 tabs/ day. Back to work 6 hr /d. blood pressure has been up to 150/88, w/ increased pain, feeling real jittery. Only taking Cymbalta 30mg  once daily, did not increase to 60mg  . Not sleeping well, wakes up frequently.--Referred to psychology for counseling, Cymbalta increased 60mg     ~  August 06, 2009:  had back surg w/ fusion by DrCohen at [facility] hosp in Oct09... much difficulty after surg- out of work til 4/10 (ret part time) and 8/10 (just ret full time)... she is seeing DrPhillips for pain management now on EMBEDA 50-2 Bid + MSIR for breakthrough pain...  October 04, 2009 --Returns for follow up . Would like to discuss pt's cymbalta. She continues to have daily pain , followed at pain clinic. Recent labs in June this year were essiatally unremarkable. Would like folate/b12 levels checked. She is under alot of stress w/ having to move soon, pain level, husband is out of work. She did  go to counseling, with some help. Mood is better on Cymbalta 60mg .     October 25, 2009--Last visit found to have low B12 level at 118. Started on B12 shots monthly. She was in 2 weeks ago for her shot and b/p was up, she was seen by Dr. Maple Hudson and b/p returned down in 140s prior to leaving. She was experiencing alot of back pain that day.  Today she returns for follow up, feeling some better. She has been told she may need more back surgery. Denies chest pain, dyspnea, orthopnea, hemoptysis, fever, n/v/d, edema, headache.     Medications Prior to Update:  1)  Lasix 20 Mg  Tabs (Furosemide) .Marland Kitchen.. 1 By Mouth Once Daily As Needed Leg Swelling 2)  K-Tabs 10 Meq  Tbcr (Potassium Chloride) .Marland Kitchen.. 1 By Mouth Once Daily  With Lasix As Needed For Leg Swelling. 3)  Levbid 0.375 Mg  Tb12 (Hyoscyamine Sulfate) .Marland Kitchen.. 1 Tab By Mouth Two Times A Day As Needed For Abd Cramping... 4)  Promethazine Hcl 25 Mg  Tabs (Promethazine Hcl) .Marland Kitchen.. 1 Tab By Mouth Q6h As Needed For Nausea.Marland KitchenMarland Kitchen 5)  Vitamin D 29562 Unit  Caps (Ergocalciferol) .Marland Kitchen.. 1 By Mouth Every Week 6)  Multivitamins   Tabs (Multiple Vitamin) .Marland Kitchen.. 1 Tab Daily.Marland KitchenMarland Kitchen 7)  Lunesta 2 Mg Tabs (Eszopiclone) .Marland Kitchen.. 1 By Mouth At Bedtime As Needed Trouble Sleeping 8)  Cymbalta 60 Mg Cpep (Duloxetine Hcl) .... Take 1 Tab By Mouth Once Daily.Marland KitchenMarland Kitchen 9)  Protonix 40 Mg Pack (Pantoprazole Sodium) .... Take 1 Tablet By Mouth Once A Day 10)  Oxycodone Hcl 20 Mg Tabs (Oxycodone Hcl) .... Take 1 Tablet By Mouth Three Times A Day For Chronic Pain 11)  Orphenadrine Citrate Cr 100 Mg Xr12h-Tab (Orphenadrine Citrate) .... Take 1 Tab By Mouth At Bedtime For Muscle Relaxation 12)  Cyanocobalamin 1000 Mcg/ml Soln (Cyanocobalamin) .... Injection Once A Month  Current Medications (verified): 1)  Lasix 20 Mg  Tabs (Furosemide) .Marland Kitchen.. 1 By Mouth Once Daily As Needed Leg Swelling 2)  K-Tabs 10 Meq  Tbcr (Potassium Chloride) .Marland Kitchen.. 1 By Mouth Once Daily  With Lasix As Needed For Leg Swelling. 3)  Levbid 0.375 Mg  Tb12 (Hyoscyamine Sulfate) .Marland Kitchen.. 1 Tab By Mouth Two Times A Day As Needed For Abd Cramping... 4)  Promethazine Hcl 25 Mg  Tabs (Promethazine Hcl) .Marland Kitchen.. 1 Tab By Mouth Q6h As Needed For Nausea.Marland KitchenMarland Kitchen 5)  Vitamin D 13086 Unit  Caps (Ergocalciferol) .Marland Kitchen.. 1 By Mouth Every Week 6)  Multivitamins   Tabs (Multiple Vitamin) .Marland Kitchen.. 1 Tab Daily.Marland KitchenMarland Kitchen 7)  Lunesta 2 Mg Tabs (Eszopiclone) .Marland Kitchen.. 1 By Mouth At Bedtime As Needed Trouble Sleeping 8)  Cymbalta 60 Mg Cpep (Duloxetine Hcl) .... Take 1 Tab By Mouth Once Daily.Marland KitchenMarland Kitchen 9)  Protonix 40 Mg Pack (Pantoprazole Sodium) ....  Take 1 Tablet By Mouth Once A Day 10)  Oxycodone Hcl 30 Mg Tabs (Oxycodone Hcl) .... Take 1 Tablet By Mouth Three Times A Day As Needed 11)  Orphenadrine Citrate Cr 100 Mg Xr12h-Tab (Orphenadrine Citrate) .... Take 1 Tab By Mouth At Bedtime For Muscle Relaxation 12)  Cyanocobalamin 1000 Mcg/ml Soln (Cyanocobalamin) .... Injection Once A Month 13)  Oxycodone-Acetaminophen 5-325 Mg Tabs (Oxycodone-Acetaminophen) .... Take 1 Tablet By Mouth Three Times A Day As Needed  Allergies (verified): 1)  ! Sulfa 2)  ! * Tequin 3)  ! Lipitor (Atorvastatin Calcium)  Past History:  Past Medical History: Last updated: 08/06/2009 BRONCHITIS, RECURRENT (ICD-491.9) CIGARETTE SMOKER (ICD-305.1) VENOUS INSUFFICIENCY (  ICD-459.81) HYPERCHOLESTEROLEMIA (ICD-272.0) REFLUX ESOPHAGITIS (ICD-530.11) DIVERTICULOSIS OF COLON (ICD-562.10) IRRITABLE BOWEL SYNDROME (ICD-564.1) COLONIC POLYPS (ICD-211.3) CALCULUS, KIDNEY (ICD-592.0) BACK PAIN (ICD-724.5) OSTEOPENIA (ICD-733.90) HEADACHE (ICD-784.0) VERTIGO (ICD-780.4) DYSTHYMIA (ICD-300.4)  Past Surgical History: Last updated: 08/06/2009 Appendectomy Tubal ligation - DrHolland Back Surgery- 10/09 by DrCohen  Family History: Last updated: 2007/12/10 Father:  died age 78 w/ colon ca & lung mets; hx prostate ca aswell. Mother:  [name], age 58 w/ hx heart dz & prev MVR; also has DJD. 3 Sibs:  2Bro & 1Sis - 1Bro w/ colon ca recently dx'd & awaiting surgin [place]; sis w/ hx skin squam cell ca's.  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  +Smoker - starting in 20's til present, up to 1&1/2 ppd for a 30+ pack year hx. Social Etoh, no drugs.  Risk Factors: Smoking Status: current (10/07/2007) Packs/Day: 1.0 (10/02/2009)  Review of Systems      See HPI  Vital Signs:  Patient profile:   60 year old female Height:       64 inches Weight:      121 pounds BMI:     20.84 O2 Sat:      99 % on Room air Temp:     98.1 degrees F oral Pulse rate:   88 / minute BP sitting:   126 / 84  (left arm) Cuff size:   regular  Vitals Entered By: Boone Master CNA (October 25, 2009 2:36 PM)  O2 Flow:  Room air CC: 2 week follow up for BP.  states feels that her BP is better since the pain clinic increased her pain medications. Is Patient Diabetic? No Comments Medications reviewed with patient Daytime contact number verified with patient. Boone Master CNA  October 25, 2009 2:39 PM    Physical Exam  Additional Exam:  General: A/Ox3; pleasant and cooperative SKIN: no rash, lesions NODES: no lymphadenopathy HEENT: [place]/AT, EOM- WNL, Conjuctivae- clear, PERRLASpeech.  NECK: Supple w/ fair ROM, JVD- none, normal carotid impulses w/o bruits Thyroid CHEST: Clear to P&A HEART: RRR, no m/g/r heard ABDOMEN:  ZOX:WRUE, nl pulses, no edema  NEURO: Grossly intact to observation      Impression & Recommendations:  Problem # 1:  HYPERTENSION (ICD-401.9)  b/p improved.  advised on diet and low salt intake.  b/p log, bring to next visit.  Her updated medication list for this problem includes:    Lasix 20 Mg Tabs (Furosemide) .Marland Kitchen... 1 by mouth once daily as needed leg swelling  Orders: Est. Patient Level II (45409)  Problem # 2:  VITAMIN B12 DEFICIENCY (ICD-266.2)  cont w/ b12 shots monthly.   Orders: Est. Patient Level II (81191)  Medications Added to Medication List This Visit: 1)  Oxycodone Hcl 30 Mg Tabs (Oxycodone hcl) .... Take 1 tablet by mouth three times a day as needed 2)  Oxycodone-acetaminophen 5-325 Mg Tabs (Oxycodone-acetaminophen) .... Take 1 tablet by mouth three times a day as needed  Complete Medication List: 1)  Lasix 20 Mg Tabs (Furosemide) .Marland Kitchen.. 1 by mouth once daily as needed leg swelling 2)  K-tabs 10 Meq Tbcr (Potassium chloride) .Marland Kitchen.. 1 by mouth once daily  with lasix as needed for leg  swelling. 3)  Levbid 0.375 Mg Tb12 (Hyoscyamine sulfate) .Marland Kitchen.. 1 tab by mouth two times a day as needed for abd cramping... 4)  Promethazine  Hcl 25 Mg Tabs (Promethazine hcl) .Marland Kitchen.. 1 tab by mouth q6h as needed for nausea.Marland KitchenMarland Kitchen 5)  Vitamin D [address] Unit Caps (Ergocalciferol) .Marland Kitchen.. 1 by mouth every week 6)  Multivitamins Tabs (Multiple vitamin) .Marland Kitchen.. 1 tab daily.Marland KitchenMarland Kitchen 7)  Lunesta 2 Mg Tabs (Eszopiclone) .Marland Kitchen.. 1 by mouth at bedtime as needed trouble sleeping 8)  Cymbalta 60 Mg Cpep (Duloxetine hcl) .... Take 1 tab by mouth once daily.Marland KitchenMarland Kitchen 9)  Protonix 40 Mg Pack (Pantoprazole sodium) .... Take 1 tablet by mouth once a day 10)  Oxycodone Hcl 30 Mg Tabs (Oxycodone hcl) .... Take 1 tablet by mouth three times a day as needed 11)  Orphenadrine Citrate Cr 100 Mg Xr12h-tab (Orphenadrine citrate) .... Take 1 tab by mouth at bedtime for muscle relaxation 12)  Cyanocobalamin 1000 Mcg/ml Soln (Cyanocobalamin) .... Injection once a month 13)  Oxycodone-acetaminophen 5-325 Mg Tabs (Oxycodone-acetaminophen) .... Take 1 tablet by mouth three times a day as needed  Patient Instructions: 1)  Continue on B12 shots monthly.   2)  follow up Dr. Kriste Basque as scheduled.  3)  Please contact office for sooner follow up if symptoms do not improve or worsen

## 2011-01-27 NOTE — Assessment & Plan Note (Signed)
Summary: follow-up visit per TP/ewj   Primary Care Provider:  Lalla Brothers  CC:  Yearly ROV & review of mult medical problems....  History of Present Illness: 60 y/o WF here for a follow up visit... she has multiple medical problems as noted below...    ~  Mar09: 1)  FLP was abn w/ TChol 250, LDL 156... Simvastatin 20mg /d started, but she reports intolerant... therefore she went on low chol/ low fat diet and repeat FLP 5/09 showed TChol 205, LDL 102 = much improved, therefore continue diet efforts... and 2) VitD level was low at 12 (30-90) so VitD 50K per week was started... tol well, continue same Rx for now...   ~  seen 4/09 for edema- Rx Lasix with some improvement... venous dopplers that were negative for DVT...  Lexapro was not working & changed to Cymbalta (does feel some better)...  traveled [place] [facility], stood up and left leg gave away with subsequent fall into bench/ newspapaer stand striking back and hip- went to [facility] ER & reports xray negative... initiated w/u w/ DrVoytek, Ortho & Pain Management, DrKirstens...    ~  eval DrVoytek w/ DDD and shots w/o help... she's had MRI and discogram w/ review by DrCohen and surg is planned at [facility] in Oct... she tells me she will need to be off work an additional 3 months after the surg... she prev saw DrNudelman who didn't think surg would help her- but this was before the discogram... I have encouraged her to f/u w/ neurosurg for their review of the additional studies before she proceeds...   ~  August 06, 2009:  had back surg w/ fusion by DrCohen at [facility] hosp in Oct09... much difficulty after surg- out of work til 4/10 (ret part time) and 8/10 (just ret full time)... she is seeing DrPhillips for pain management now on EMBEDA 50-2 Bid + MSIR for breakthrough pain...    Current Problem List:  VERTIGO (ICD-780.4) - Hosp 8/08 w/ neg eval and eventually referred to Neurology w/ nothing found; Rx'd w/ Antivert, Phenergan, vestib therapy...  BRONCHITIS, RECURRENT (ICD-491.9) - 1ppd smoker w/ hx recurrent bronchitis... counselled to quit.  REFLUX ESOPHAGITIS (ICD-530.11) - EGD 9/05 by DrPerry showed mod severe reflux esoph w/ erosions,  Rx PROTONIX 40mg /d regularly w/ good control, but states she needs it Bid...  DIVERTICULOSIS OF COLON (ICD-562.10), &  [name] (ICD-564.1) - she takes LEVBID Bidprn...  ~  extensive GI eval by drKaplan 1/10 for abd pain, [name], etc... neg EGD, neg CTAbd, neg Emptying scan... believed secondary to narcotic analgesics...  COLONIC POLYPS (ICD-211.3) - Last colonoscopy 2/06 by [name] showed Divertic, otherw neg; f/u 78yrs w/ +fam hx colon ca.  CALCULUS, KIDNEY (ICD-592.0) - Hx of Kidney Stones & small renal cysts on CT scan & sonar - followed by DrWrenn.  BACK PAIN, LUMBAR (ICD-724.2) - eval and rx by DrVoytek w/ DDD and no surg possible... she's had series of epidural shots from DrStrong without relief... being referred to a chronic pain clinic...  ~  fall w/ injury 2009 & further eval from DrCohen- she decided to proceed w/ spinal surgery & fusion by DrCohen 10/09 at [facility] hosp... much difficulty post op & now followed in the pain management clinic by DrPhillips.  OSTEOPENIA (ICD-733.90) - she was prev on EVISTA, Ca++, Vits (w/ BMD per GYN)... she stopped the Evista due to nausea which she feels is better off this med.  ~  labs 3/09 showed Vit d level = 12.Marland KitchenMarland Kitchen  started on Vit D 50000 u weekly.  ~  labs 6/10 showed Vit D level = 49... OK to switch to 1000 u OTC daily.  ~  8/10:  due for f/u GYN eval and BMD at [facility] office.  HEADACHE (ICD-784.0)  ANXIETY (ICD-300.00) - prev on Lexapro & Xanax, but these were stopped in favor of CYMBALTA 60mg /d, and LUNESTA 2mg  Qhs...  HEALTH MAINTENANCE:  GYN= DrHolland & PAP, Mammogram, etc... all up-to-date; she is due a BMD in 1/10; and colon 10/11...    Allergies: 1)  ! Sulfa 2)  ! * Tequin 3)  ! Lipitor (Atorvastatin Calcium)  Comments:  Nurse/Medical  Assistant: The patient's medications and allergies were reviewed with the patient and were updated in the Medication and Allergy Lists.  Past History:  Past Medical History: BRONCHITIS, RECURRENT (ICD-491.9) CIGARETTE SMOKER (ICD-305.1) VENOUS INSUFFICIENCY (ICD-459.81) HYPERCHOLESTEROLEMIA (ICD-272.0) REFLUX ESOPHAGITIS (ICD-530.11) DIVERTICULOSIS OF COLON (ICD-562.10) IRRITABLE BOWEL SYNDROME (ICD-564.1) COLONIC POLYPS (ICD-211.3) CALCULUS, KIDNEY (ICD-592.0) BACK PAIN (ICD-724.5) OSTEOPENIA (ICD-733.90) HEADACHE (ICD-784.0) VERTIGO (ICD-780.4) DYSTHYMIA (ICD-300.4)  Past Surgical History: Appendectomy Tubal ligation - DrHolland Back Surgery- 10/09 by DrCohen  Family History: Reviewed history from 12/06/2007 and no changes required. Father:  died age 67 w/ colon ca & lung mets; hx prostate ca aswell. Mother:  [name], age 14 w/ hx heart dz & prev MVR; also has DJD. 3 Sibs:  2Bro & 1Sis - 1Bro w/ colon ca recently dx'd & awaiting surgin ; sis w/ hx skin squam cell ca's.  Social History: Reviewed history from 12/06/2007 and no changes required. 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.  +Smoker - starting in 20's til present, up to 1&1/2 ppd for a 30+ pack year hx. Social Etoh, no drugs.  Review of Systems      See HPI       The patient complains of dyspnea on exertion, muscle weakness, and difficulty walking.  The patient denies anorexia, fever, weight loss, weight gain, vision loss, decreased hearing, hoarseness, chest pain, syncope, peripheral edema, prolonged cough, headaches, hemoptysis, abdominal pain, melena, hematochezia, severe indigestion/heartburn, hematuria, incontinence, suspicious skin lesions, transient blindness, depression, unusual weight change, abnormal bleeding, enlarged lymph nodes, and angioedema.    Vital Signs:  Patient profile:   60 year old  female Height:      64 inches Weight:      118.25 pounds BMI:     20.37 O2 Sat:      97 % on Room air Temp:     98.2 degrees F oral Pulse rate:   80 / minute BP sitting:   100 / 74  (left arm) Cuff size:   regular  Vitals Entered By: Marijo File CMA (August 06, 2009 3:12 PM)  O2 Sat at Rest %:  97 O2 Flow:  Room air CC: Yearly ROV & review of mult medical problems... Comments meds updated today per pt   Physical Exam  Additional Exam:  WD, WN, 60 y/o WF in NAD... GENERAL:  Alert & oriented; pleasant & cooperative... HEENT:  [place]/AT,  EACs-clear, TMs-wnl, NOSE-clear, THROAT-clear & wnl. NECK:  Supple w/ fairROM; no JVD; normal carotid impulses w/o bruits; no thyromegaly or nodules palpated; no lymphadenopathy. CHEST:  Clear to P & A; without wheezes/ rales/ or rhonchi heard... HEART:  Regular Rhythm; without murmurs/ rubs/ or gallops detected... ABDOMEN:  Soft & nontender; normal bowel sounds; no  organomegaly or masses palpated... EXT: without deformities, mild arthritic changes; no varicose veins/ +venous insuffic/ no edema.,  limp w/ walking.  NEURO:  no focal neuro deficits... DERM:  no lesions see...     MISC. Report  Procedure date:  08/06/2009  Findings:      DATA Reviewed for this OV:   ~  EMR & EChart from 2009-2010...  ~  Ortho surg/ eval by DrCohen et al...  ~  GI eval by [name] et al...  Impression & Recommendations:  Problem # 1:  BRONCHITIS, RECURRENT (ICD-491.9) Breathing has been OK... must quit all smoking!!!  Problem # 2:  HYPERCHOLESTEROLEMIA (ICD-272.0) We discussed low chol/ low fat diet, and incr exercise as she is able...  Problem # 3:  REFLUX ESOPHAGITIS (ICD-530.11) Continue Protonix Rx... Her updated medication list for this problem includes:    Levbid 0.375 Mg Tb12 (Hyoscyamine sulfate) .Marland Kitchen... 1 tab by mouth two times a day as needed for abd cramping...    Protonix 40 Mg Pack (Pantoprazole sodium) .Marland Kitchen... Take 1 tablet by mouth once a  day  Problem # 4:  IRRITABLE BOWEL SYNDROME (ICD-564.1) Continue GI eval and Rx from DrKaplan...  Problem # 5:  BACK PAIN (ICD-724.5) S/p back surg from DrCohen, and pain management from DrPhillips... Her updated medication list for this problem includes:    Oxycodone-acetaminophen 10-325 Mg Tabs (Oxycodone-acetaminophen) .Marland Kitchen... 1 tab every 4-6 hours as needed for pain    Embeda 50-2 Mg Cr-caps (Morphine-naltrexone) .Marland Kitchen... Take one capsule by mouth two times a day    Morphine Sulfate 15 Mg Tabs (Morphine sulfate) .Marland Kitchen... As needed for break through pain  Problem # 6:  OSTEOPENIA (ICD-733.90) She is off the Evista due to nausea and due for f/u GYN eval DrHolland w/ BMD check... Her updated medication list for this problem includes:    Evista 60 Mg Tabs (Raloxifene hcl) .Marland Kitchen... Take 1 tablet by mouth once a day  Problem # 7:  OTHER MEDICAL PROBLEMS AS LISTED...  Complete Medication List: 1)  Lasix 20 Mg Tabs (Furosemide) .Marland Kitchen.. 1 by mouth once daily as needed leg swelling 2)  K-tabs 10 Meq Tbcr (Potassium chloride) .Marland Kitchen.. 1 by mouth once daily  with lasix as needed for leg swelling. 3)  Levbid 0.375 Mg Tb12 (Hyoscyamine sulfate) .Marland Kitchen.. 1 tab by mouth two times a day as needed for abd cramping... 4)  Promethazine Hcl 25 Mg Tabs (Promethazine hcl) .Marland Kitchen.. 1 tab by mouth q6h as needed for nausea.Marland KitchenMarland Kitchen 5)  Evista 60 Mg Tabs (Raloxifene hcl) .... Take 1 tablet by mouth once a day 6)  Vitamin D 78295 Unit Caps (Ergocalciferol) .Marland Kitchen.. 1 by mouth every week 7)  Multivitamins Tabs (Multiple vitamin) .Marland Kitchen.. 1 tab daily.Marland KitchenMarland Kitchen 8)  Lunesta 2 Mg Tabs (Eszopiclone) .Marland Kitchen.. 1 by mouth at bedtime as needed trouble sleeping 9)  Oxycodone-acetaminophen 10-325 Mg Tabs (Oxycodone-acetaminophen) .Marland Kitchen.. 1 tab every 4-6 hours as needed for pain 10)  Cymbalta 60 Mg Cpep (Duloxetine hcl) .... Take 1 tab by mouth once daily... 11)  Protonix 40 Mg Pack (Pantoprazole sodium) .... Take 1 tablet by mouth once a day 12)  Embeda 50-2 Mg Cr-caps  (Morphine-naltrexone) .... Take one capsule by mouth two times a day 13)  Morphine Sulfate 15 Mg Tabs (Morphine sulfate) .... As needed for break through pain  Other Orders: Prescription Created Electronically [phone])  Patient Instructions: 1)  Today we updated your med list- see below.... 2)  We refilled your CYMBALTA per request...  3)  OK  to finish out your Vit D 50000 u tabs once weekly then change to OTC 1000 u caps daily along w/ your multivit & calcium supplements... 4)  Call for any problems.Marland KitchenMarland Kitchen 5)  Please schedule a follow-up appointment in 6 months. Prescriptions: CYMBALTA 60 MG CPEP (DULOXETINE HCL) take 1 tab by mouth once daily...  #30 x prn   Entered and Authorized by:   Michele Mcalpine MD   Signed by:   Michele Mcalpine MD on 08/06/2009   Method used:   Print then Give to Patient   RxID:   [id number]

## 2011-01-27 NOTE — Progress Notes (Signed)
Summary: Condition Update   Phone Note Call from Patient Call back at [phone]   Caller: Patient Call For: Dr. [name] Reason for Call: Talk to Nurse Summary of Call: abx not helping abd pain... abd pain has actually increased Initial call taken by: Vallarie Mare,  September 04, 2010 10:48 AM  Follow-up for Phone Call        F/u from triage 09-02-10. Pt. has been taking antibiotics since tuesday. Pain is LLQ, more constant and severe. Some nausea. Some loose stools. Denies blood, black stools, fever.  Pt. will see [facility] today at 3:30pm.  Follow-up by: Laureen Ochs LPN,  September 04, 2010 12:33 PM

## 2011-01-27 NOTE — Progress Notes (Signed)
Summary: triage   Phone Note Call from Patient Call back at [phone]   Caller: Patient Call For: Dr [name] Reason for Call: Talk to Nurse Summary of Call: Patient states that she has a lot of pain on her left side and is taking her meds and is on a bland diet but it is not helping, wants to know what to do. Initial call taken by: Tawni Levy,  September 02, 2010 1:38 PM  Follow-up for Phone Call         Has had llq pain intermittent for months but pain has become constant x3 days and describes pain as a level 7-8.Had 10 loose stools on Sunday,0 on Monday and 3 today with lots of mucus but no blood.Is on Opano 20 mg. b.i.d.  and Percocet 7.5 q.i.d. for back pain and says those meds do not relieve the pain.Becomes nauseated prior to loose stools. No shaking chills. Follow-up by: Teryl Lucy RN,  September 02, 2010 3:21 PM  Additional Follow-up for Phone Call Additional follow up Details #1::        begin cipro 500mg  two times a day, flagyl 500mg  two times a day c/b if not improved next 2-3 days Additional Follow-up by: Louis Meckel MD,  September 02, 2010 3:53 PM    Additional Follow-up for Phone Call Additional follow up Details #2::    Orders for antibiotics are to be for 10 days per Dr.Estrella Alcaraz.. [facility] [phone].Will call back if no better. in 2-3 dyas. Follow-up by: Teryl Lucy RN,  September 02, 2010 4:20 PM

## 2011-01-27 NOTE — Progress Notes (Signed)
Summary: returned call  Phone Note Call from Patient Call back at [phone]   Caller: Patient Call For: tammy p Reason for Call: Talk to Nurse Summary of Call: calling back to get test results - returning call from jj Initial call taken by: Eugene Gavia,  October 08, 2009 8:38 AM  Follow-up for Phone Call        called spoke with patient.  advised of lab results as stated by TP.  pt verbalized her uderstanding. Follow-up by: Boone Master CNA,  October 08, 2009 9:23 AM

## 2011-01-27 NOTE — Progress Notes (Signed)
Summary: needs f/u appt w/ sn  Phone Note Call from Patient   Caller: Patient Call For: nadel Summary of Call: pt requests a f/u w/ sn. her f/u was bumped and avail sched is too far out per pt. [phone] Initial call taken by: Tivis Ringer,  July 02, 2009 10:22 AM  Follow-up for Phone Call        Please advise if/when we could work pt into the sched with SN thanks! Vernie Murders  July 02, 2009 10:25 AM   ok to add pt on to SN schedule for 8-10 at 3pm.  thanks Marijo File CMA  July 02, 2009 10:57 AM     Additional Follow-up for Phone Call Additional follow up Details #1::        Called spoke with pt advised of new appt date and time on 08/06/09 at 3p. Additional Follow-up by: Cloyde Reams RN,  July 02, 2009 11:05 AM

## 2011-01-27 NOTE — Procedures (Signed)
Summary: [facility] COLON 02/17/2005   Colonoscopy  Procedure date:  02/17/2005  Findings:      Location:  [facility].      Patient Name: Denise King, Denise King MRN: [id number] Procedure Procedures: Colonoscopy CPT: [phone].  Personnel: Endoscopist: Barbette Hair. [name], MD.  Patient Consent: Procedure, Alternatives, Risks and Benefits discussed, consent obtained, from patient.  Indications Symptoms: Abdominal pain / bloating.  History  Current Medications: Patient is not currently taking Coumadin.  Pre-Exam Physical: Performed Feb 17, 2005. Cardio-pulmonary exam, Cardio-pulmonary exam, HEENT exam , HEENT exam , Abdominal exam, Abdominal exam, Extremity exam, Neurological exam, Mental status exam WNL.  Exam Exam: Extent of exam reached: Cecum, extent intended: Descending Colon.  The cecum was identified by IC valve. Colon retroflexion performed. ASA Classification: I. Tolerance: good.  Monitoring: Pulse and BP monitoring, Oximetry used. Supplemental O2 given. at 2 Liters.  Colon Prep Used Fleets enema for colon prep. Prep results: excellent.  Sedation Meds: Patient assessed and found to be appropriate for moderate (conscious) sedation. Sedation was managed by the Endoscopist. Fentanyl 100 mcg. given IV. Versed 10 mg. given IV.  Findings - DIVERTICULOSIS: Descending Colon to Sigmoid Colon. ICD9: Diverticulosis: 562.10. Comments: Scattered tics.  - NORMAL EXAM: Cecum to Descending Colon.  NORMAL EXAM: Cecum.   Assessment Abnormal examination, see findings above.  Diagnoses: 562.10: Diverticulosis.   Events  Unplanned Interventions: No intervention was required.  Unplanned Events: There were no complications. Plans  Post Exam Instructions: Post sedation instructions given.  Medication Plan: Antispasmodics: Pamine Forte BID, starting Feb 17, 2005   Patient Education: Patient given standard instructions for: Diverticulosis. d/c antibiotics.   Scheduling/Referral: Office Visit, to [employer]. [name], MD, around Mar 17, 2005.    This report was created from the original endoscopy report, which was reviewed and signed by the above listed endoscopist.

## 2011-01-27 NOTE — Progress Notes (Signed)
Summary: rx for poss bladder infection/ pharmacy info  Phone Note Call from Patient   Caller: Patient Call For: [place] Summary of Call: pt [facility] [address] [place], [place]. pt did not have a phone # for this, however. pt# [phone]. this is in regards to recent urine culture.  Initial call taken by: Tivis Ringer,  May 18, 2008 3:52 PM  Follow-up for Phone Call        10,000  E.col send cipro for 7 days  follow up if no better  Follow-up by: Rubye Oaks NP,  May 18, 2008 4:14 PM    New/Updated Medications: CIPRO 250 MG  TABS (CIPROFLOXACIN HCL) 1 by mouth two times a day   Prescriptions: CIPRO 250 MG  TABS (CIPROFLOXACIN HCL) 1 by mouth two times a day  #14 x 0   Entered by:   Rubye Oaks NP   Authorized by:   Pulmonary Triage   Signed by:   Thania Woodlief NP on 05/18/2008   Method used:   [facility] ...       [facility]  [address] [id number]       [address]       [place], [place]  [address]       Ph: [phone] or [phone]       Fax: [phone]   RxID:   [id number]     Appended Document: rx for poss bladder infection/ pharmacy info [facility] [place] [facility] pt's rx.  called [phone], got number for [facility] in [place] [phone], called in cipro.  pt aware.

## 2011-01-27 NOTE — Assessment & Plan Note (Signed)
Summary: 2 weeks/apc   Chief Complaint:  2 week follow up - feet and ankles are some better and but still swelling.  History of Present Illness: 2 week follow up   60 year old female with known history of [name], chronic low back pain with DDD followed by Dr. Jethro Bolus  and Hyperlipidemia  presents for lower extremity edema   worse in afternoons. Given Lasix last OV with improved symtpoms for first week. Last few days swelling in ankles worse in evening after work, works 60 hrs /wk,  resolved in morning. Has a bruise for 1 month on left thigh, sore to touch. Hit on table. Denies chest pain, dyspnea, orthopnea, hemoptysis, fever, n/v/d,. calf pain, recent travel, redness.  complains Lexapro is not working, has chronic back pain takes vicodin daily, followed at pain clinic. Feels like things are not getting better. Feels sad alot. Denies suicidal ideations.  Complains that has nause frequently, none x 2 day. Stopped Simvastatin did not work. Denies abdominal pain, bloody stools, vomitting.        Prior Medication List:  PROTONIX 40 MG  TBEC (PANTOPRAZOLE SODIUM) Take 1 tablet by mouth once a day PROMETHAZINE HCL 25 MG  TABS (PROMETHAZINE HCL) 1 tab by mouth Q6H as needed for nausea... LEVBID 0.375 MG  TB12 (HYOSCYAMINE SULFATE) 1 tab by mouth two times a day as needed for abd cramping... EVISTA 60 MG  TABS (RALOXIFENE HCL) Take 1 tablet by mouth once a day MULTIVITAMINS   TABS (MULTIPLE VITAMIN) 1 tab daily... LEXAPRO 10 MG  TABS (ESCITALOPRAM OXALATE) Take 1 tablet by mouth once a day ALPRAZOLAM 0.5 MG  TABS (ALPRAZOLAM) Take 1 tab by mouth at bedtime VICODIN 5-500 MG  TABS (HYDROCODONE-ACETAMINOPHEN) 1 tab by mouth Q6H as needed for severe pain... VITAMIN D 16109 UNIT  CAPS (ERGOCALCIFEROL) 1 by mouth EVERY WEEK LASIX 20 MG  TABS (FUROSEMIDE) 1 by mouth once daily as needed leg swelling   Current Allergies (reviewed today): ! SULFA ! * TEQUIN ! LIPITOR (ATORVASTATIN CALCIUM)  Past  Medical History:    Reviewed history from 04/12/2008 and no changes required:       BRONCHITIS, RECURRENT (ICD-491.9)       CIGARETTE SMOKER (ICD-305.1)       REFLUX ESOPHAGITIS (ICD-530.11)       DIVERTICULOSIS OF COLON (ICD-562.10)       [name] (ICD-564.1)       COLONIC POLYPS (ICD-211.3)       CALCULUS, KIDNEY (ICD-592.0)       BACK PAIN, LUMBAR (ICD-724.2)       OSTEOPENIA (ICD-733.90)       HEADACHE (ICD-784.0)       VERTIGO (ICD-780.4)       ANXIETY (ICD-300.00)          Family History:    Reviewed history from 12/06/2007 and no changes required:       Father:  died age 31 w/ colon ca & lung mets; hx prostate ca aswell.       Mother:  [name], age 53 w/ hx heart dz & prev MVR; also has DJD.       3 Sibs:  2Bro & 1Sis - 1Bro w/ colon ca recently dx'd & awaiting surgin [place]; sis w/ hx skin squam cell ca's.  Social History:    Reviewed history from 12/06/2007 and no changes required:       Married to husb for 30 yrs         4 children -  all in good health       Works for [employer]       She has BS degreein Horiculture from [place].              +Smoker - starting in 20's til present, up to 1&1/2 ppd for a 30+ pack year hx.       Social Etoh, no drugs.   Risk Factors: Tobacco use:  current    Cigarettes:  Yes -- 1 pack(s) per day   Review of Systems      See HPI   Vital Signs:  Patient Profile:   60 Years Old Female Weight:      130 pounds O2 Sat:      98 % O2 treatment:    Room Air Temp:     97.2 degrees F oral Pulse rate:   95 / minute BP sitting:   108 / 72  (left arm) Cuff size:   regular  Vitals Entered By: Boone Master CNA (April 26, 2008 9:25 AM)             Is Patient Diabetic? No Comments Medications reviewed with patient Boone Master CNA  April 26, 2008 9:26 AM      Physical Exam  GENERAL:  A/Ox3; pleasant & cooperative.NAD HEENT:  [place]/AT, EOM-wnl, PERRLA, EACs-clear, TMs-wnl, NOSE-clear, THROAT-clear & wnl. NECK:  Supple w/  fair ROM; no JVD; normal carotid impulses w/o bruits; no thyromegaly or nodules palpated; no lymphadenopathy. CHEST:  Clear to P & A; w/o, wheezes/ rales/ or rhonchi. HEART:  RRR, no m/r/g  heard ABDOMEN:  Soft & nt; nml bowel sounds; no organomegaly or masses detected. EXT: Warm bilat,  no calf pain, tr  edema, mild venous insuff. changes, clubbing, pulses intact, neg homans sign small linear hematoma along left thigh Skin: no rash/lesion      Impression & Recommendations:  Problem # 1:  EDEMA (ICD-782.3) Suspect is venous insuffiency however will check venous doppler to r/o DVT. Low salt diet. Keep legs elevated in evenings, support stockings. follow up 1 month and as needed Labs reviewed with normal BMP/BNP.  Orders: Doppler Referral (Doppler) Est. Patient Level V (95621)   Problem # 2:  DEPRESSION (ICD-311) Not controlled on lexapro . Switch to Cymbalta 30mg  once daily samples given for 4 weeks,  follow up 1 month and increase to 60mg  as indicated.  hopefully will get some side benefit of pain relief as well.  advised on working less hours, stress management,  Orders: Est. Patient Level V (30865)   Problem # 3:  [name] (ICD-564.1) Intermittent nausea, pt takes meds on empty stomach advisted to take all med with food except PPI.  diet changes.  Orders: Est. Patient Level V (78469)   Problem # 4:  HYPERCHOLESTEROLEMIA (ICD-272.0) restart Simvastatin  follow up 1 month w/ labs.  Orders: Est. Patient Level V (62952)   Medications Added to Medication List This Visit: 1)  Cymbalta 30 Mg Cpep (Duloxetine hcl) .Marland Kitchen.. 1 by mouth once daily   Patient Instructions: 1)  follow up doppler as scheduled. 2)  Lasix 20mg  once daily as needed  3)  Low salt diet  4)  Legs elevated  5)  Stop Lexapro begin Cymbalta 30mg  once daily  6)  Change Multivitamin to at bedtime with small snack 7)  Take Vicodin with food.  8)  follow up 4 weeks and as needed  9)  Please contact office for  sooner follow up if  symptoms do not improve or worsen     ]

## 2011-01-27 NOTE — Letter (Signed)
Summary: [facility] Instructions  [facility]  [address] [place], [place] [address]   Phone: [phone]  Fax: [phone]       Denise King    Mar 08, 1951    MRN: [id number]        Procedure Day /Date:MONDAY 10/06/2010     Arrival Time:2PM     Procedure Time:3PM    Location of Procedure:                    X   [facility] (4th Floor)   PREPARATION FOR COLONOSCOPY WITH MOVIPREP   Starting 5 days prior to your procedure 10/5/2011do not eat nuts, seeds, popcorn, corn, beans, peas,  salads, or any raw vegetables.  Do not take any fiber supplements (e.g. Metamucil, Citrucel, and Benefiber).  THE DAY BEFORE YOUR PROCEDURE         DATE:10/05/2010 DAY: SUNDAY  1.  Drink clear liquids the entire day-NO SOLID FOOD  2.  Do not drink anything colored red or purple.  Avoid juices with pulp.  No orange juice.  3.  Drink at least 64 oz. (8 glasses) of fluid/clear liquids during the day to prevent dehydration and help the prep work efficiently.  CLEAR LIQUIDS INCLUDE: Water Jello Ice Popsicles Tea (sugar ok, no milk/cream) Powdered fruit flavored drinks Coffee (sugar ok, no milk/cream) Gatorade Juice: apple, white grape, white cranberry  Lemonade Clear bullion, consomm, broth Carbonated beverages (any kind) Strained chicken noodle soup Hard Candy                             4.  In the morning, mix first dose of MoviPrep solution:    Empty 1 Pouch A and 1 Pouch B into the disposable container    Add lukewarm drinking water to the top line of the container. Mix to dissolve    Refrigerate (mixed solution should be used within 24 hrs)  5.  Begin drinking the prep at 5:00 p.m. The MoviPrep container is divided by 4 marks.   Every 15 minutes drink the solution down to the next mark (approximately 8 oz) until the full liter is complete.   6.  Follow completed prep with 16 oz of clear liquid of your choice (Nothing red or purple).  Continue to  drink clear liquids until bedtime.  7.  Before going to bed, mix second dose of MoviPrep solution:    Empty 1 Pouch A and 1 Pouch B into the disposable container    Add lukewarm drinking water to the top line of the container. Mix to dissolve    Refrigerate  THE DAY OF YOUR PROCEDURE      DATE: 10/06/2010 DAY: MONDAY  Beginning at 10:00a.m. (5 hours before procedure):         1. Every 15 minutes, drink the solution down to the next mark (approx 8 oz) until the full liter is complete.  2. Follow completed prep with 16 oz. of clear liquid of your choice.    3. You may drink clear liquids until 1:00PM(2 HOURS BEFORE PROCEDURE).   MEDICATION INSTRUCTIONS  Unless otherwise instructed, you should take regular prescription medications with a small sip of water   as early as possible the morning of your procedure.           OTHER INSTRUCTIONS  You will need a responsible adult at least 58 years of age to accompany you and drive you  home.   This person must remain in the waiting room during your procedure.  Wear loose fitting clothing that is easily removed.  Leave jewelry and other valuables at home.  However, you may wish to bring a book to read or  an iPod/MP3 player to listen to music as you wait for your procedure to start.  Remove all body piercing jewelry and leave at home.  Total time from sign-in until discharge is approximately 2-3 hours.  You should go home directly after your procedure and rest.  You can resume normal activities the  day after your procedure.  The day of your procedure you should not:   Drive   Make legal decisions   Operate machinery   Drink alcohol   Return to work  You will receive specific instructions about eating, activities and medications before you leave.    The above instructions have been reviewed and explained to me by   _______________________    I fully understand and can verbalize these instructions  _____________________________ Date _________

## 2011-01-27 NOTE — Progress Notes (Signed)
Summary: Triage   Phone Note Call from Patient Call back at Home Phone [phone]   Caller: Patient Call For: Mike Gip Reason for Call: Talk to Nurse Summary of Call: Continues to have loose black stools, pain on left side Initial call taken by: Karna Christmas,  September 08, 2010 8:42 AM  Follow-up for Phone Call        The pt has not improved much.  She is still hurting under left rib cage, last rib area.  She is bloated and this is putting pressure on her rib cage.  I asked if she is taking the Bentyl and she report that his isn't helping and she didn't take it yasterday or today.  She feels weak, not able to eat. Anytime she tries to eat she starts hurting.  I told her to at least drink soup broths, Ensure, Boost.   Follow-up by: Joselyn Glassman,  September 08, 2010 10:44 AM  Additional Follow-up for Phone Call Additional follow up Details #1::        SPOKE WITH DR. KAPLAN. BELIEVE ALOT OF THIS IS ANXIETY/STRESS RELATED. CONTINUE CIPRO/FLAGYL,STOP BENYYL, HYOMAX IF NOT HELPING. ADD HER ON FOR [facility]. [facility]- WED    Additional Follow-up by: Peterson Ao,  September 08, 2010 3:34 PM    Additional Follow-up for Phone Call Additional follow up Details #2::    Above MD orders reviewed with patient. She will see Dr.Kaplan on 09-10-10 at 9am. If symptoms become worse call back immediately or go to ER.  Follow-up by: Laureen Ochs LPN,  September 08, 2010 3:45 PM

## 2011-01-27 NOTE — Progress Notes (Signed)
Summary: feet swelling  Phone Note Call from Patient Call back at [phone]   Caller: Patient Call For: Denise King Summary of Call: need lasix for swelling in her feet [facility] Initial call taken by: Rickard Patience,  May 19, 2010 11:47 AM  Follow-up for Phone Call        Pt c/o bilateral foot swelling, tightness since last Thursday, some hand swelling. Better after elevating legs and waking up in the AM's. Pt scheduled to see SN August 05, 2010. Pt requesting refill of Lasix and states does not need a refil on potassium at this time. Zackery Barefoot CMA  May 19, 2010 11:58 AM   90 day Rx sent electronically. Pt informed Zackery Barefoot CMA  May 19, 2010 12:04 PM     New/Updated Medications: LASIX 20 MG  TABS (FUROSEMIDE) 1 by mouth once daily as needed leg swelling Prescriptions: LASIX 20 MG  TABS (FUROSEMIDE) 1 by mouth once daily as needed leg swelling  #90 x 0   Entered by:   Zackery Barefoot CMA   Authorized by:   Michele Mcalpine MD   Signed by:   Zackery Barefoot CMA on 05/19/2010   Method used:   [facility]  [address] [phone][facility] (retail[facility]       [name]       [place], [place]  [address]       Ph: [phone]       Fax: [phone]   RxID:   [id number]

## 2011-01-27 NOTE — Consult Note (Signed)
Summary: [facility]  [facility]   Imported By: Esmeralda Links D'jimraou 02/10/2008 10:41:24  _____________________________________________________________________  External Attachment:    Type:   Image     Comment:   External Document

## 2011-01-27 NOTE — Progress Notes (Signed)
Summary: still sick  Phone Note Call from Patient Call back at [facility] Phone [phone]   Caller: Patient Call For: nadel/tammy p Reason for Call: Talk to Nurse Summary of Call: pt still sick and out of work since appt last week w/ Tammy P.  Congestion, waekness, no energy, no fever Initial call taken by: Eugene Gavia,  August 07, 2008 10:11 AM  Follow-up for Phone Call        Pt states she is feeling some better. she is still weak but has no N & V and no fever. She is scheduled to for a follow-up with Dr. Kriste Basque on Friday, 08-10-08 and will keep that appt. She doesn't feel she needs to be seen before then. Follow-up by: Michel Bickers CMA,  August 07, 2008 10:23 AM

## 2011-01-27 NOTE — Progress Notes (Signed)
Summary: abd pain   Phone Note Call from Patient Call back at [phone]   Caller: Patient Call For: Mike Gip Reason for Call: Talk to Nurse Summary of Call: pt reports still having a lot of pain in her abd and meds are not helping Initial call taken by: Vallarie Mare,  February 07, 2010 2:04 PM  Follow-up for Phone Call        patient states that she went to [facility] ER on 02/03/2010 and was given an rx for imoddium, that stopped the diarrhea. She is still taking the flagyl and florastor as well. She is taking the Levbid three times a day with out any relief. She is having LLQ pain that she rates a Constant 8 or 9. She denies any blood or mucous in her stools. Patient says she can not take the pain she would like something called in for the pain. I advised her I will send the note to the DOD and call her back once they have reviewed it.  Follow-up by: Harlow Mares CMA Duncan Dull),  February 07, 2010 2:20 PM  Additional Follow-up for Phone Call Additional follow up Details #1::        change hyoscyamine to glycopyrrolate 2 mg two times a day #60 no refill keep f/u Dr. [name] 2/22 (I have reviewwed 2/7 ED report and prior CT and our notes/labs) Additional Follow-up by: GESSNER    Additional Follow-up for Phone Call Additional follow up Details #2::    patient aware and rx sent Follow-up by: Harlow Mares CMA (AAMA),  February 07, 2010 3:03 PM  New/Updated Medications: GLYCOPYRROLATE 2 MG TABS (GLYCOPYRROLATE) take one by mouth two times a day Prescriptions: GLYCOPYRROLATE 2 MG TABS (GLYCOPYRROLATE) take one by mouth two times a day  #60 x 0   Entered by:   Harlow Mares CMA (AAMA)   Authorized by:   Iva Boop MD, [facility]   Signed by:   Harlow Mares CMA (AAMA) on 02/07/2010   Method used:   [facility]        [facility]  [address] [id number]       [address]       [place], [place]  [address]       Ph: [phone]       Fax: [phone]   RxID:    [id number]

## 2011-01-27 NOTE — Progress Notes (Signed)
Summary: Cx Colon Monday 10-10 3pm   Phone Note Call from Patient Call back at Home Phone [phone]   Call For: Dr [name] Summary of Call: Patient fell and broke her ankle and is unable to move. Is cancelling her Colon scheduled for Monday 10-06-10 3pm. Will call back to reschedule when she is able to walk again. Do you want to charge Cancel Fee? Initial call taken by: Leanor Kail [facility],  October 03, 2010 12:59 PM  Follow-up for Phone Call        no Follow-up by: Louis Meckel MD,  October 03, 2010 2:00 PM  Additional Follow-up for Phone Call Additional follow up Details #1::        Patient NOT BILLED.  Additional Follow-up by: Leanor Kail [facility],  October 08, 2010 12:45 PM

## 2011-01-27 NOTE — Assessment & Plan Note (Signed)
Summary: F/U FROM APPT. W/AMY AND CONTINUED SYMPTOMS.      [name]    History of Present Illness Visit Type: Follow-up Visit Primary GI MD: Melvia Heaps MD [facility] Primary Provider: Alroy Dust, MD Requesting Provider: n/a Chief Complaint: Follow up from seeing Amy and still having bloating  History of Present Illness:   Ms. Cheyney has returned for followup of her abdominal pain.  Despite taking Cipro and Flagyl  she continues to complain of abdominal pain.  It is primarily in the left upper quadrant and is rather persistent.  At baseline she has 2-3 loose stools daily accompanied by severe urgency.  She claims her stools are black.  Laboratory including CBC and basic metabolic profile were unremarkable.  She takes oxycodone and Percocet for chronic back pain.  She has not been taking Bentyl or hyoscyamine.  She complains of abdominal bloating.   GI Review of Systems    Reports bloating.      Denies abdominal pain, acid reflux, belching, chest pain, dysphagia with liquids, dysphagia with solids, heartburn, loss of appetite, nausea, vomiting, vomiting blood, weight loss, and  weight gain.        Denies anal fissure, black tarry stools, change in bowel habit, constipation, diarrhea, diverticulosis, fecal incontinence, heme positive stool, hemorrhoids, irritable bowel syndrome, jaundice, light color stool, liver problems, rectal bleeding, and  rectal pain.    Current Medications (verified): 1)  Lasix 20 Mg  Tabs (Furosemide) .Marland Kitchen.. 1 By Mouth Once Daily As Needed Leg Swelling 2)  K-Tabs 10 Meq  Tbcr (Potassium Chloride) .Marland Kitchen.. 1 By Mouth Once Daily  With Lasix As Needed For Leg Swelling. 3)  Protonix 40 Mg Tbec (Pantoprazole Sodium) .... Take 1 Tablet By Mouth Once A Day 4)  Promethazine Hcl 25 Mg  Tabs (Promethazine Hcl) .Marland Kitchen.. 1 Tab By Mouth Q6h As Needed For Nausea.Marland KitchenMarland Kitchen 5)  Cymbalta 60 Mg Cpep (Duloxetine Hcl) .... Take 1 Tab By Mouth Once Daily.Marland KitchenMarland Kitchen 6)  Opana Er 5 Mg Xr12h-Tab (Oxymorphone Hcl)  .... Take 1-2 Tablets By Mouth Every 6 Hours As Needed For Breakthough Pain 7)  Multivitamins   Tabs (Multiple Vitamin) .Marland Kitchen.. 1 Tab Daily.Marland KitchenMarland Kitchen 8)  Vitamin D 1000 Unit Tabs (Cholecalciferol) .... Take 1 Tab By Mouth Once Daily.Marland KitchenMarland Kitchen 9)  Percocet 7.5-325 Mg Tabs (Oxycodone-Acetaminophen) .... One Tablet By Mouth Four Times Daily 10)  Hyoscyamine Sulfate Cr 0.375 Mg Xr12h-Tab (Hyoscyamine Sulfate) .... One Tablet By Mouth Two Times A Day As Needed For Cramps 11)  Dicyclomine Hcl 10 Mg Caps (Dicyclomine Hcl) .... Take 1 Tab 3-4 Times Daily As Needed For Cramping and Spasms 12)  Unisom 25 Mg Tabs (Doxylamine Succinate (Sleep)) .Marland Kitchen.. 1 At Bedtime 13)  Alprazolam 0.5 Mg Tabs (Alprazolam) .... Take One To 2 Tabs Q.6 H. P.r.n. 14)  Moviprep 100 Gm  Solr (Peg-Kcl-Nacl-Nasulf-Na Asc-C) .... As Per Prep Instructions.  Allergies (verified): 1)  ! Sulfa 2)  ! * Tequin 3)  ! Lipitor (Atorvastatin Calcium)  Past History:  Past Medical History: Last updated: 02/05/2010  BRONCHITIS, RECURRENT (ICD-491.9) CIGARETTE SMOKER (ICD-305.1) VENOUS INSUFFICIENCY (ICD-459.81) GERD (ICD-530.81) REFLUX ESOPHAGITIS (ICD-530.11) DIVERTICULOSIS OF COLON (ICD-562.10) IRRITABLE BOWEL SYNDROME (ICD-564.1) COLONIC POLYPS (ICD-211.3) CALCULUS, KIDNEY (ICD-592.0) BACK PAIN (ICD-724.5) CHRONIC PAIN SYNDROME (ICD-338.4) OSTEOPENIA (ICD-733.90) HEADACHE (ICD-784.0) VERTIGO (ICD-780.4) DYSTHYMIA (ICD-300.4) DEPRESSION (ICD-311) VITAMIN B12 DEFICIENCY (ICD-266.2)  Past Surgical History: Last updated: 02/05/2010 Appendectomy Tubal ligation - DrHolland Back Surgery- 10/09 by DrCohen Tonsillectomy  Family History: Last updated: 2010-02-14 Father:  died age 59 w/ colon ca &  lung mets; hx prostate ca aswell. Mother:  [name], age 42 w/ hx heart dz & prev MVR; also has DJD. 3 Sibs:  2Bro & 1Sis -  1Bro w/ colon ca recently dx'd & awaiting surgin [place];  sis w/ hx skin squam cell ca's. 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 +Smoker - starting in 20's til present, up to 1&1/2 ppd for a 30+ pack year hx. no drugs. Alcohol Use - no Illicit Drug Use - no  Review of Systems  The patient denies allergy/sinus, anemia, anxiety-new, arthritis/joint pain, back pain, blood in urine, breast changes/lumps, change in vision, confusion, cough, coughing up blood, depression-new, fainting, fatigue, fever, headaches-new, hearing problems, heart murmur, heart rhythm changes, itching, menstrual pain, muscle pains/cramps, night sweats, nosebleeds, pregnancy symptoms, shortness of breath, skin rash, sleeping problems, sore throat, swelling of feet/legs, swollen lymph glands, thirst - excessive , urination - excessive , urination changes/pain, urine leakage, vision changes, and voice change.         Abdominal discomfort  Vital Signs:  Patient profile:   60 year old female Height:      64 inches Weight:      126 pounds BMI:     21.71 BSA:     1.61 Pulse rate:   80 / minute Pulse rhythm:   regular BP sitting:   110 / 70  (left arm)  Vitals Entered By: Merri Ray CMA Duncan Dull) (September 10, 2010 9:13 AM)  Physical Exam  Additional Exam:  On physical exam she appears uncomfortable due to abdominal discomfort  Abdomen is without masses, tenderness organomegaly   Impression & Recommendations:  Problem # 1:  ABDOMINAL PAIN, LEFT LOWER QUADRANT (ICD-789.04)  Persistant pain in the left upper quadrant despite antibiotics.  I think it is unlikely that she has diverticulitis.  She is under great deal of stress.  Pain is most likely related to [name].  Recommendations #1 trial of Xanax 0.5-1 mg q.6 h. p.r.n. #2  simethacone as needed #3 colonoscopy to rule out microscopic colitis  Orders: Colonoscopy (Colon)  Problem # 2:   DIARRHEA (ICD-787.91)  Plan colonoscopy as per assessment #1  Orders: Colonoscopy (Colon)  Problem # 3:  FAMILY HX COLON CANCER (ICD-V16.0) Last colonoscopy 2006.  N. followup colonoscopy  Patient Instructions: 1)  Copy sent to : Alroy Dust, MD 2)  #1 follow Xanax 0.5-1 mg q.6 h. p.r.n. 3)  #2 some simethacone as needed 4)  #3 colonoscopy to rule out microscopic colitis 5)  Colonoscopy and Flexible Sigmoidoscopy brochure given.  6)  Conscious Sedation brochure given.  7)  The medication list was reviewed and reconciled.  All changed / newly prescribed medications were explained.  A complete medication list was provided to the patient / caregiver. Prescriptions: PROMETHAZINE HCL 25 MG  TABS (PROMETHAZINE HCL) 1 tab by mouth Q6H as needed for nausea...  #30 x 2   Entered by:   Merri Ray CMA (AAMA)   Authorized by:   Louis Meckel MD   Signed by:   Merri Ray CMA (AAMA) on 09/10/2010   Method used:   [facility]        [facility]  [address] [id number]       [address]       [place]  [place], [place]  [address]       Ph: [phone]       Fax: [phone]   RxID:   [id number] MOVIPREP 100 GM  SOLR (PEG-KCL-NACL-NASULF-NA ASC-C) As per prep instructions.  #1 x 0   Entered by:   Merri Ray CMA (AAMA)   Authorized by:   Louis Meckel MD   Signed by:   Merri Ray CMA (AAMA) on 09/10/2010   Method used:   [facility]        [facility]  [address] [phone][facility] (retail)       [address]       [place], [place]  [address]       Ph: [phone]       Fax: [phone]   RxID:   [id number] ALPRAZOLAM 0.5 MG TABS (ALPRAZOLAM) take one to 2 tabs q.6 h. p.r.n.  #20 x 1   Entered and Authorized by:   Louis Meckel MD   Signed by:   Louis Meckel MD on 09/10/2010   Method used:   Print then Give to Patient   RxID:   [id number]

## 2011-01-27 NOTE — Assessment & Plan Note (Signed)
Summary: swollen feet and ankles,not going down/apc   Chief Complaint:  swellig in feet and ankles x4 days - pt discontinued her simvastatin due nausea.  History of Present Illness: 60 year old female with known history of [name] and Hyperlipidemai presents for 4 day of ankle edema worse in afternoons. Denies chest pain, dyspnea, orthopnea, hemoptysis, fever, n/v/d,. calf pain, recent travel, redness.      Prior Medication List:  PROTONIX 40 MG  TBEC (PANTOPRAZOLE SODIUM) 1 tab by mouth two times a day as directed... PROMETHAZINE HCL 25 MG  TABS (PROMETHAZINE HCL) 1 tab by mouth Q6H as needed for nausea... LEVBID 0.375 MG  TB12 (HYOSCYAMINE SULFATE) 1 tab by mouth two times a day as needed for abd cramping... EVISTA 60 MG  TABS (RALOXIFENE HCL) Take 1 tablet by mouth once a day CALTRATE 600+D 600-400 MG-UNIT  TABS (CALCIUM CARBONATE-VITAMIN D) 1 tab by mouth two times a day... MULTIVITAMINS   TABS (MULTIPLE VITAMIN) 1 tab daily... LEXAPRO 10 MG  TABS (ESCITALOPRAM OXALATE) Take 1 tablet by mouth once a day ALPRAZOLAM 0.5 MG  TABS (ALPRAZOLAM) Take 1 tab by mouth three times a day as needed for nerves... DARVOCET-N 100 100-650 MG  TABS (PROPOXYPHENE N-APAP) 1 tab by mouth Q4H as needed for pain... VICODIN 5-500 MG  TABS (HYDROCODONE-ACETAMINOPHEN) 1 tab by mouth Q6H as needed for severe pain... VITAMIN D 81191 UNIT  CAPS (ERGOCALCIFEROL) 1 by mouth EVERY WEEK SIMVASTATIN 20 MG  TABS (SIMVASTATIN) 1 by mouth at bedtime   Current Allergies (reviewed today): ! SULFA ! * TEQUIN ! LIPITOR (ATORVASTATIN CALCIUM)  Past Medical History:    Reviewed history from 03/06/2008 and no changes required:       BRONCHITIS, RECURRENT (ICD-491.9)       CIGARETTE SMOKER (ICD-305.1)       REFLUX ESOPHAGITIS (ICD-530.11)       DIVERTICULOSIS OF COLON (ICD-562.10)       [name] (ICD-564.1)       COLONIC POLYPS (ICD-211.3)       CALCULUS, KIDNEY (ICD-592.0)       BACK PAIN, LUMBAR (ICD-724.2)  OSTEOPENIA (ICD-733.90)       HEADACHE (ICD-784.0)       VERTIGO (ICD-780.4)       ANXIETY (ICD-300.00)          Family History:    Reviewed history from 12/06/2007 and no changes required:       Father:  died age 59 w/ colon ca & lung mets; hx prostate ca aswell.       Mother:  [name], age 45 w/ hx heart dz & prev MVR; also has DJD.       3 Sibs:  2Bro & 1Sis - 1Bro w/ colon ca recently dx'd & awaiting surgin [place]; sis w/ hx skin squam cell ca's.  Social History:    Reviewed history from 12/06/2007 and no changes required:       Married to husb for 30 yrs         4 children - all in good health       Works for [employer] Rec [employer]       She has BS degreein Horiculture from [employer].              +Smoker - starting in 20's til present, up to 1&1/2 ppd for a 30+ pack year hx.       Social Etoh, no drugs.   Risk Factors:  Tobacco use:  current  Cigarettes:  Yes -- 1 pack(s) per day   Review of Systems      See HPI   Vital Signs:  Patient Profile:   60 Years Old Female Weight:      134.25 pounds O2 Sat:      99 % O2 treatment:    Room Air Temp:     97.3 degrees F oral Pulse rate:   89 / minute BP sitting:   108 / 64  (right arm) Cuff size:   regular  Vitals Entered By: Boone Master CNA (April 12, 2008 10:47 AM)             Is Patient Diabetic? No Comments Medications reviewed with patient ..................................................................Marland KitchenBoone Master CNA  April 12, 2008 10:47 AM      Physical Exam  GENERAL:  A/Ox3; pleasant & cooperative.NAD HEENT:  [place]/AT, EOM-wnl, PERRLA, EACs-clear, TMs-wnl, NOSE-clear, THROAT-clear & wnl. NECK:  Supple w/ fair ROM; no JVD; normal carotid impulses w/o bruits; no thyromegaly or nodules palpated; no lymphadenopathy. CHEST:  Clear to P & A; w/o, wheezes/ rales/ or rhonchi. HEART:  RRR, no m/r/g  heard ABDOMEN:  Soft & nt; nml bowel sounds; no organomegaly or masses detected. EXT: Warm bilat,  no calf  pain, trace edema, clubbing, pulses intact, neg. homans sign Skin: no rash/lesion      Impression & Recommendations:  Problem # 1:  EDEMA (ICD-782.3) Suspect component of venous insufficiency.  Lasix 20mg  once daily for 3 days then once daily as needed  Low salt diet  Legs elevated  follow up 2 weeks and as needed  Please contact office for sooner follow up if symptoms do not improve or worsen  Orders: TLB-BMP (Basic Metabolic Panel-BMET) (80048-METABOL) TLB-BNP (B-Natriuretic Peptide) (83880-BNPR)   Medications Added to Medication List This Visit: 1)  Protonix 40 Mg Tbec (Pantoprazole sodium) .... Take 1 tablet by mouth once a day 2)  Alprazolam 0.5 Mg Tabs (Alprazolam) .... Take 1 tab by mouth at bedtime 3)  Lasix 20 Mg Tabs (Furosemide) .Marland Kitchen.. 1 by mouth once daily as needed leg swelling   Patient Instructions: 1)  Lasix 20mg  once daily for 3 days then once daily as needed  2)  Low salt diet  3)  Legs elevated  4)  follow up 2 weeks and as needed  5)  Please contact office for sooner follow up if symptoms do not improve or worsen     Prescriptions: LASIX 20 MG  TABS (FUROSEMIDE) 1 by mouth once daily as needed leg swelling  #30 x 5   Entered and Authorized by:   Rubye Oaks NP   Signed by:   Skyler Dusing NP on 04/12/2008   Method used:   Electron[facility] to ...       [facility]  [address] [id number]       [address]       [place], [place]  [address]       Ph: [phone] or [phone]       Fax: [phone]   RxID:   [id number]  ]

## 2011-01-27 NOTE — Progress Notes (Signed)
Summary: triage   Phone Note Call from Patient Call back at [phone]   Caller: Patient Call For: Dr. [name] Reason for Call: Talk to Nurse Summary of Call: pt reporting constant diarrhea for at least 2 weeks... has been to ER for fluids... pt doesnt want to wait until Feb 7th to see Dr. [name]... ok to see Amy or Gunnar Fusi Initial call taken by: Vallarie Mare,  January 27, 2010 8:28 AM  Follow-up for Phone Call        Pt. was seen in the [facility] ER 1 week ago. Continues w/diarrhea. She will see Mike Gip [facility] today at 2:30pm. Follow-up by: Laureen Ochs LPN,  January 27, 2010 9:42 AM

## 2011-01-27 NOTE — Letter (Signed)
Summary: Out of Work  [employer] [employer]  [address]   [place], [place] [address]   Phone: [phone]  Fax: [phone]    November 05, 2009   Employee:  SAMHITA KRETSCH    To Whom It May Concern:   For Medical reasons, please excuse the above named employee from work for the following dates:  Start:   Thursday October 31, 2009  End:   Thursday November 07, 2009  If you need additional information, please feel free to contact our office.         Sincerely,       Alekzander Cardell, N.P.

## 2011-01-27 NOTE — Progress Notes (Signed)
Summary: rx [place]   Medications Added KAPIDEX 60 MG CPDR (DEXLANSOPRAZOLE) 1 tablet by mouth once daily 30 minutes before 1st meal       Phone Note Call from Patient Call back at Home Phone [phone]   Caller: Patient Call For: Viva Gallaher Reason for Call: Talk to Nurse Summary of Call: Patient states she used all the samples given to her for kapidex and states that she woud [facility] an [facility] [address] Initial call taken by: Tawni Levy,  February 18, 2009 11:43 AM  Follow-up for Phone Call        called pt to inform I [facility] [address] Follow-up by: Merri Ray CMA,  February 18, 2009 1:13 PM    New/Updated Medications: KAPIDEX 60 MG CPDR (DEXLANSOPRAZOLE) 1 tablet by mouth once daily 30 minutes before 1st meal   Prescriptions: KAPIDEX 60 MG CPDR (DEXLANSOPRAZOLE) 1 tablet by mouth once daily 30 minutes before 1st meal  #30 x 6   Entered by:   Merri Ray CMA   Authorized by:   Louis Meckel MD   Signed by:   Merri Ray CMA on 02/18/2009   Method used:   [facility]  [address] [id number]       [address]       [place], [place]  [address]       Ph: [phone] or [phone]       Fax: [phone]   RxID:   [id number]

## 2011-01-27 NOTE — Assessment & Plan Note (Signed)
Summary: 6 MOS/VAC  Medications Added TRAMADOL HCL 50 MG TABS (TRAMADOL HCL)  NICODERM CQ 14 MG/24HR  PT24 (NICOTINE) use as directed EVISTA 60 MG  TABS (RALOXIFENE HCL) Take 1 tablet by mouth once a day PROMETHAZINE HCL 25 MG  TABS (PROMETHAZINE HCL) take one tablet once daily for nausea DARVOCET-N 100 100-650 MG  TABS (PROPOXYPHENE N-APAP) as needed        Vital Signs:  Patient Profile:   60 Years Old Female Weight:      135 pounds O2 Sat:      95 % Temp:     98.1 degrees F oral Pulse rate:   84 / minute BP sitting:   132 / 70  (right arm)  Vitals Entered By: Marijo File CMA (December 06, 2007 11:46 AM) Oxygen therapy Room Air                 Chief Complaint:  2 wk ROV for f/u of recnet Pneumonia and UTI.  History of Present Illness: 1.  75 WF known to me w/ mult med problems... Hospitalized 8/5-7/08 for severe weakness, nausea, & vertigo... Rx'd w/ Antivert & Phenergan... referred to Neurology w/ Eval by DrWillis Antony Haste... Pt reports nothing found during their eval and symptoms gradually improved... she went for outpt vestibular rehab as well... (no note from Neuro in chart).  2.  Seen 11/08 by NP-TammyParrett w/ UTI and Bronchitic exas... CXR showed Lingular infiltrate & Rx'd w/ Levaquin x10d, plus Pred taper & mucinex... symptoms improved slowly & she is +- back to baseline... Urine culture grew a sens EColi...   Unfortunately MrsMullane continues to smoke, but has cut down... she's been trying the patch, and states she didn't tol Chantix prev... we spent a good bitof time discussing a re-try on ChantixRx.  She notes min cough, and denies sputum, congestion, SOB, CP, etc...  3.  She also C/O a mild tremor... she thinks poss from the Pred Rx but this ended over a week ago... more likely anxiety related & she discussed her Brother's recent dx w/ colon ca & upcoming surg.  Current Allergies: ! SULFA ! * TEQUIN SULFAMETHOXAZOLE (SULFAMETHOXAZOLE)  Past Medical  History:    Current Problems:     1. VERTIGO (ICD-780.4) - Hosp 8/08 w/ neg eval and eventually referred to Neurology w/ nothing found;  Pt Rx w/ Antivert, Phenergan, vestib therapy.    2. BRONCHITIS, RECURRENT (ICD-491.9)    3. REFLUX ESOPHAGITIS (ICD-530.11) - EGD 9/05 by DrPerry showed mod severe reflux esoph w/ erosions, Rx Protonix regularly w/ good control.    4. DIVERTICULOSIS OF COLON (ICD-562.10)    5. [name] (ICD-564.1)    6. COLONIC POLYPS (ICD-211.3) - Last colonoscopy 2/06 by [name] showed Divertic, otherw neg; f/u 66yrs w/ +fam hx colon ca.    7. CALCULUS, KIDNEY (ICD-592.0) - Hx of Kidney Stones & small renal cysts on CT scan & sonar - followed by DrWrenn.    8. BACK PAIN, LUMBAR (ICD-724.2)    9. OSTEOPENIA (ICD-733.90)    10. HEADACHE (ICD-784.0)    11. ANXIETY (ICD-300.00)      Past Surgical History:    Appendectomy    Tubal ligation - DrHolland   Family History:    Father:  died age 96 w/ colon ca & lung mets; hx prostate ca aswell.    Mother:  [name], age 32 w/ hx heart dz & prev MVR; also has DJD.    3 Sibs:  2Bro & 1Sis -  1Bro w/ colon ca recently dx'd & awaiting surgin [facility]; sis w/ hx skin squam cell ca's.  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        +Smoker - starting in 20's til present, up to 1&1/2 ppd for a 30+ pack year hx.    Social Etoh, no drugs.    Review of Systems  General: Notes persistant weakness & malaise; Denies fever, chills, sweats, anorexia,  weight loss, and sleep disorder.  ENT: Denies earache, ear discharge, tinnitus, decreased hearing, nasal congestion, nosebleeds, sore throat, and hoarseness.  Cardiovascular: Denies chest pains, palpitations, syncope, dyspnea on exertion, orthopnea, PND, and peripheral edema.  Respiratory: Denies cough, dyspnea at rest, excessive sputum, hemoptysis, wheezing, and  pleurisy.  GI: Denies nausea, vomiting, diarrhea, constipation, change in bowel habits, abdominal pain, melena, hematochezia, jaundice, gas/bloating, indigestion/heartburn, dysphagia, and odynophagia.  GU: Denies dysuria, hematuria, discharge, urinary frequency, urinary hesitancy, nocturia, incontinence..  Musculoskeletal:Notes back pain,; but denies joint pain, joint swelling, muscle cramps, muscle weakness, stiffness, arthritis, sciatica, restless legs, leg pain at night, and leg pain with exertion.  Neuro: Notes some vertigo intermittently & tremor; Denies paralysis, paresthesias, seizures,  transient blindness, frequent falls, frequent headaches, and difficulty walking.    Physical Exam  WD, WN 56 WF in NAD GENERAL:  Alert & oriented; pleasant & cooperative. HEENT:  [place]/AT, EOM-wnl, PERRLA, Fundi-benign, EACs-clear, TMs-wnl, NOSE-clear, THROAT-clear & wnl. NECK:  Supple w/ full ROM; no JVD; normal carotid impulses w/o bruits; no thyromegaly or nodules palpated; no lymphadenopathy. CHEST:  Clear to P & A; without wheezes/ rales/ or rhonchi. HEART:  Regular Rhythm; without murmurs/ rubs/ or gallops. ABDOMEN:  Soft & nontender; normal bowel sounds; no organomegaly or masses detected. EXT: without deformities or arthritic changes; no varicose veins/ venous insuffic/ or edema. NEURO:  CN's intact; motor testing normal; sensory testing normal; gait normal & balance OK; no tremor noted. DERM:  No lesions noted; no rash etc...      Impression & Recommendations:  Problem # 1:  PNEUMONIA (ICD-486) She has finished a 10d course of Levaquin and responded nicely... appears to be back to baseline... we discussed smoking cessation strategies and she will try the Chantix again - starting w/ 1/2 tab per day and slowly increasing the dose... we plan f/u ov in the spring w/ CPX.  Problem # 2:  ANXIETY (ICD-300.00) She has considerable anxiety - made worse by her younger bro's dx of colon ca recently...  she has noted a tremor intermittently & I offered her a neuro referral but prefer to try incr Xanax to 1/2 tab Bid during the days & 1qhs... she will call if worse for referral. Her updated medication list for this problem includes:    Lexapro 10 Mg Tabs (Escitalopram oxalate) .Marland Kitchen... Take 1 tablet by mouth once a day    Alprazolam 0.5 Mg Tabs (Alprazolam) .Marland Kitchen... Take 1 tab by mouth at bedtime   Problem # 3:  BACK PAIN, LUMBAR (ICD-724.2) She notes mod LBP & has seen DrVoytek for full eval w/ Xray & MR (has 3 bulging & degenerated discs)... she's been told that surg & shots won't help and she's been taking DCN & Tramadol as needed... Pred helps but could make her osteopenia worse... I rec f/u w/ DrVoytek prn Her updated  medication list for this problem includes:    Tramadol Hcl 50 Mg Tabs (Tramadol hcl)    Darvocet-n 100 100-650 Mg Tabs (Propoxyphene n-apap) .Marland Kitchen... As needed   Complete Medication List: 1)  Protonix 40 Mg Tbec (Pantoprazole sodium) .... Take 1 tablet by mouth once a day 2)  Lexapro 10 Mg Tabs (Escitalopram oxalate) .... Take 1 tablet by mouth once a day 3)  Alprazolam 0.5 Mg Tabs (Alprazolam) .... Take 1 tab by mouth at bedtime 4)  Tramadol Hcl 50 Mg Tabs (Tramadol hcl) 5)  Nicoderm Cq 14 Mg/24hr Pt24 (Nicotine) .... Use as directed 6)  Evista 60 Mg Tabs (Raloxifene hcl) .... Take 1 tablet by mouth once a day 7)  Promethazine Hcl 25 Mg Tabs (Promethazine hcl) .... Take one tablet once daily for nausea 8)  Darvocet-n 100 100-650 Mg Tabs (Propoxyphene n-apap) .... As needed     ]

## 2011-01-27 NOTE — Progress Notes (Signed)
Summary: TRIAGE   Phone Note Call from Patient Call back at Home Phone [phone]   Caller: Patient Call For: Shanise Balch Reason for Call: Talk to Nurse, Lab or Test Results Summary of Call: pt wants to know emptying scan results Initial call taken by: Tawni Levy,  January 22, 2009 10:36 AM  Follow-up for Phone Call        Pt. notified that her GES is normal. Pt. c/o continued nausea, occ. vomiting,  heartburn,  and early satiety.Pt. also c/o fatigue.Takes Protonix once daily. 1) Eat smaller more frequent meals throught the day. 2) Soft,bland diet. No spicy,greasy,fried foods. 3) Add Ensure or Boost 1-2X daily I will callback after Dr.Daryon Remmert reviews and further advises. [facility] PLEASE ADVISE Follow-up by: Laureen Ochs LPN,  January 22, 2009 10:58 AM  Additional Follow-up for Phone Call Additional follow up Details #1::        Hold protonix.  Try kapidex 60mg  once daily c/b next week Additional Follow-up by: Louis Meckel MD,  January 23, 2009 8:44 AM      Appended Document: Domingo Dimes Above MD instructions reviewed w/pt. She will pick-up samples of Kapidex and callback with a condition update in one week. Pt. instructed to call back sooner,as needed.

## 2011-01-27 NOTE — Progress Notes (Signed)
Summary: TRIAGE-epigastric pain,n/v   Phone Note Call from Patient Call back at Home Phone [phone]   Caller: Patient Call For: Japhet Morgenthaler Reason for Call: Talk to Nurse Details for Reason: TRIAGE Summary of Call: Nausea and severe ABD PN  Initial call taken by: Guadlupe Spanish [facility],  January 03, 2009 11:38 AM  Follow-up for Phone Call        pt. c/o constant epigastric pain for 1 week. Also nausea and occ.vomiting. No fever, no diarrhea, no blood. Has been taking Phenergan for nausea. Hasn't tried Levbid 0.375mg . Declines appointment today, no transportation. 1) See Gunnar Fusi tomorrow at 2:30pm 2) liquids/soft, bland diet 3) continue Phenergan as needed 4) Begin Levbid q 12 hours 5) If symptoms become worse call back immediately or go to ER.  Follow-up by: Laureen Ochs LPN,  January 03, 2009 12:40 PM  Additional Follow-up for Phone Call Additional follow up Details #1::        ok Additional Follow-up by: Louis Meckel MD,  January 04, 2009 9:45 AM

## 2011-01-27 NOTE — Progress Notes (Signed)
Summary: rx req/ possible bronchitis per pt  Phone Note Call from Patient   Caller: Patient Call For: Denise King Summary of Call: pt c/o cough/ fever/ pressure in lungs- front and back x 4 days. believes she has bronchitis and [facility] [address] call pt at [phone] or later at home #.  Initial call taken by: Tivis Ringer, CNA,  February 19, 2010 2:44 PM  Follow-up for Phone Call        Please advise, pt last seen 02/05/10. Thanks allergic sulfa, tequin, lipitor Vernie Murders  February 19, 2010 2:47 PM   Additional Follow-up for Phone Call Additional follow up Details #1::        per SN---ok for pt to have zpak #1  take the mucinex max two times a day and increase fluids.    lmom for pt to make her aware and told her to call for any questions.  meds have been sent to her pharmacy Additional Follow-up by: Randell Loop CMA,  February 19, 2010 4:14 PM    New/Updated Medications: ZITHROMAX Z-PAK 250 MG TABS (AZITHROMYCIN) take as directed MUCINEX MAXIMUM STRENGTH 1200 MG XR12H-TAB (GUAIFENESIN) take one tablet by mouth two times a day with plenty of fluids Prescriptions: ZITHROMAX Z-PAK 250 MG TABS (AZITHROMYCIN) take as directed  #1 pak x 0   Entered by:   Randell Loop CMA   Authorized by:   Michele Mcalpine MD   Signed by:   Randell Loop CMA on 02/19/2010   Method used:   [facility]  [address] [phone]ail[facility]       [address]       [place], [place]  [address]       Ph: [phone]       Fax: [phone]   RxID:   [id number]

## 2011-01-27 NOTE — Progress Notes (Signed)
Summary: CT Scan Results   Phone Note Call from Patient Call back at Home Phone [phone]   Call For: Dr [name] Reason for Call: Lab or Test Results Summary of Call: CT scan is negative.  Schedule f. sig Initial call taken by: Leanor Kail [facility],  February 20, 2010 4:04 PM  Follow-up for Phone Call        DR.Moet Mikulski-Please review CT and advise. Follow-up by: Laureen Ochs LPN,  February 20, 2010 4:08 PM  Additional Follow-up for Phone Call Additional follow up Details #1::        CT is neg.  Schedule F.sig Additional Follow-up by: Louis Meckel MD,  February 20, 2010 4:34 PM     Appended Document: CT Scan Results Pt. is scheduled for a previsit on 02-21-10 at 3:30pm and Flex.Sig. in [facility] on 02-28-10 at 9am. Pt. instructed to call back as needed.

## 2011-01-27 NOTE — Procedures (Signed)
Summary: [facility] FLEX SIG 10/07/2006   Flexible Sigmoidoscopy  Procedure date:  10/07/2006  Comments:      Location: [facility].    Patient Name: Denise King, Denise King MRN: [id number] Procedure Procedures: Flexible Proctosigmoidoscopy CPT: [phone].  Personnel: Endoscopist: Barbette Hair. [name], MD.  Indications Symptoms: Diarrhea. Abdominal pain / bloating.  History  Current Medications: Patient is not currently taking Coumadin.  Pre-Exam Physical: Performed Oct 07, 2006. Entire physical exam was normal. Cardio- pulmonary exam  WNL. HEENT exam , Abdominal exam, Extremity exam, Neurological exam, Mental status exam WNL.  Exam Exam: Extent visualized: Descending Colon. Extent of exam: 60 cm. ASA Classification: II. Tolerance: fair, adequate exam.  Sedation Meds: Fentanyl 125 mcg. given IV. Versed 10 mg. given IV.  Findings - NORMAL EXAM: Descending Colon.  - DIVERTICULOSIS: Sigmoid Colon. ICD9: Diverticulosis: 562.10. Comments: Few diverticula.  - NORMAL EXAM: Sigmoid Colon to Rectum.   Assessment Normal examination.  Diagnoses: 562.10: Diverticulosis.   Events  Unplanned Intervention: No intervention was required.  Unplanned Events: There were no complications. Plans Patient Education: Patient given standard instructions for: a normal exam. Hold protonix.  Scheduling: Office Visit, to [employer]. [name], MD, around Oct 28, 2006.    This report was created from the original endoscopy report, which was reviewed and signed by the above listed endoscopist.

## 2011-01-27 NOTE — Letter (Signed)
Summary: Out of Work  [facility]  [address] [place], [place] [address]   Phone: [phone]  Fax: [phone]    09/10/2010  TO: WHOM IT MAY CONCERN  RE: Denise King [address] [place]       The above named individual is currently under my care and will be out of work    FROM: 09/07/2010   THROUGH:09/10/2010     MAY RETURN ON:09/11/2010     If you have any further questions or need additional information, please call.     Sincerely,    Creighton Longley,MD    typed by: Merri Ray CMA (AAMA)

## 2011-01-27 NOTE — Letter (Signed)
Summary: Out of Work  [facility]  [address] [place], [place] [address]   Phone: [phone]  Fax: [phone]    January 27, 2010   Employee:  KAIDAN SPENGLER    To Whom It May Concern:   For Medical reasons, please excuse the above named employee from work for the following dates:  Start: 01-27-10    End:   01-28-10    If you need additional information, please feel free to contact our office.         Sincerely,

## 2011-01-27 NOTE — Assessment & Plan Note (Signed)
Summary: congestion in throat and chest/ mbw   Primary Provider/Referring Provider:  Lorin Picket Nadel,MD  CC:  sinus pressure/congestion with PND, chest congestion with dry cough, increased SOB, wheezing, body aches, and increased fatigue x6days.  History of Present Illness: 60 year old female with known history of [name], chronic low back pain with DDD followed by Dr. Jethro Bolus  and Hyperlipidemia    3/09: 1)  FLP was abn w/ TChol 250, LDL 156... Simvastatin 20mg /d started, but she reports intolerant... therefore she went on low chol/ low fat diet and repeat FLP 5/09 showed TChol 205, LDL 102 = much improved, therefore continue diet efforts... and 2) VitD level was low at 12 (30-90) so VitD 50K per week   04/12/08--.Recently seen for extremity edema  worse in afternoons. Given Lasix with some improvement. Sent for venous dopplers that were negative for DVT.  Also felt  Lexapro is not working, Changed to Cymbalta and does feel some better. Recently traveled to [facility], stood up and left leg gave away with subsequent fall into bench/ newspapaer stand striking back and hip. Went to [facility] ER. Reports xray negative. Currently seeing ortho/neuro/pain spec for DDD/DJD, scheduled for back surgery for fusion of L4-5.   05/12/08 Recent UTI tx with cipro,    ~  seen 8/09 w/ acute URI/ gastroenteritis symptoms- CXR was clear & labs normal... Rx w/ Fluids, rest, Tylenol, Levsin, Phenergan, etc... she was out of work 2+weeks but finally better...  11/09/2008--Had back surgery on 10/01/08."vrey slow on recovery" complains still pressure in back, pain  and has been having low grade temp at night. Complains she feels somewhat depressed, sad at times, low interest, mood swings. no suicidal ideation.  --cybalta increased to 60mg  once daily   December 11, 2008--returns for follow up. Pt is doing better. starting to get stronger but still has right much back pain, seen Dr. Noel Gerold last week with steroid injection and steroid  taper.Not much help. Mood is better but concerned that her libidio is so down. Husband very upset. She is considering tapering off xanax to see if this will help.      June 03, 2009-- Pt is here for a sick visit.  Pt c/o elevated BP, tremors, difficulty sleeping at night and waking up tired  Pt states her husband c/o wife "screaming" while sleeping at night. Dr. Noel Gerold is weaning off pain meds, was up to six oxycodone daily, now weaned down 4 tabs/day. However has not been able to wean down except to 5 tabs/ day. Back to work 6 hr /d. blood pressure has been up to 150/88, w/ increased pain, feeling real jittery. Only taking Cymbalta 30mg  once daily, did not increase to 60mg  . Not sleeping well, wakes up frequently.--Referred to psychology for counseling, Cymbalta increased 60mg     ~  August 06, 2009:  had back surg w/ fusion by DrCohen at [facility] hosp in Oct09... much difficulty after surg- out of work til 4/10 (ret part time) and 8/10 (just ret full time)... she is seeing DrPhillips for pain management now on EMBEDA 50-2 Bid + MSIR for breakthrough pain...  October 04, 2009 --Returns for follow up . Would like to discuss pt's cymbalta. She continues to have daily pain , followed at pain clinic. Recent labs in June this year were essiatally unremarkable. Would like folate/b12 levels checked. She is under alot of stress w/ having to move soon, pain level, husband is out of work. She did go to counseling, with some  help. Mood is better on Cymbalta 60mg .     October 25, 2009--Last visit found to have low B12 level at 118. Started on B12 shots monthly. She was in 2 weeks ago for her shot and b/p was up, she was seen by Dr. Maple Hudson and b/p returned down in 140s prior to leaving. She was experiencing alot of back pain that day.  Today she returns for follow up, feeling some better. She has been told she may need more back surgery.  November 05, 2009 --Presents for work in visit. Complains of sinus pressure/congestion  with PND, chest congestion with dry cough, increased SOB, wheezing, body aches, increased fatigue x6days. Still smoking, we discussed several options to stop smoking. Denies chest pain, dyspnea, orthopnea, hemoptysis.  Medications Prior to Update: 1)  Lasix 20 Mg  Tabs (Furosemide) .Marland Kitchen.. 1 By Mouth Once Daily As Needed Leg Swelling 2)  K-Tabs 10 Meq  Tbcr (Potassium Chloride) .Marland Kitchen.. 1 By Mouth Once Daily  With Lasix As Needed For Leg Swelling. 3)  Levbid 0.375 Mg  Tb12 (Hyoscyamine Sulfate) .Marland Kitchen.. 1 Tab By Mouth Two Times A Day As Needed For Abd Cramping... 4)  Promethazine Hcl 25 Mg  Tabs (Promethazine Hcl) .Marland Kitchen.. 1 Tab By Mouth Q6h As Needed For Nausea.Marland KitchenMarland Kitchen 5)  Vitamin D 16109 Unit  Caps (Ergocalciferol) .Marland Kitchen.. 1 By Mouth Every Week 6)  Multivitamins   Tabs (Multiple Vitamin) .Marland Kitchen.. 1 Tab Daily.Marland KitchenMarland Kitchen 7)  Lunesta 2 Mg Tabs (Eszopiclone) .Marland Kitchen.. 1 By Mouth At Bedtime As Needed Trouble Sleeping 8)  Cymbalta 60 Mg Cpep (Duloxetine Hcl) .... Take 1 Tab By Mouth Once Daily.Marland KitchenMarland Kitchen 9)  Protonix 40 Mg Pack (Pantoprazole Sodium) .... Take 1 Tablet By Mouth Once A Day 10)  Oxycodone Hcl 30 Mg Tabs (Oxycodone Hcl) .... Take 1 Tablet By Mouth Three Times A Day As Needed 11)  Orphenadrine Citrate Cr 100 Mg Xr12h-Tab (Orphenadrine Citrate) .... Take 1 Tab By Mouth At Bedtime For Muscle Relaxation 12)  Cyanocobalamin 1000 Mcg/ml Soln (Cyanocobalamin) .... Injection Once A Month 13)  Oxycodone-Acetaminophen 5-325 Mg Tabs (Oxycodone-Acetaminophen) .... Take 1 Tablet By Mouth Three Times A Day As Needed  Current Medications (verified): 1)  Lasix 20 Mg  Tabs (Furosemide) .Marland Kitchen.. 1 By Mouth Once Daily As Needed Leg Swelling 2)  K-Tabs 10 Meq  Tbcr (Potassium Chloride) .Marland Kitchen.. 1 By Mouth Once Daily  With Lasix As Needed For Leg Swelling. 3)  Levbid 0.375 Mg  Tb12 (Hyoscyamine Sulfate) .Marland Kitchen.. 1 Tab By Mouth Two Times A Day As Needed For Abd Cramping... 4)  Promethazine Hcl 25 Mg  Tabs (Promethazine Hcl) .Marland Kitchen.. 1 Tab By Mouth Q6h As Needed For  Nausea.Marland KitchenMarland Kitchen 5)  Vitamin D 60454 Unit  Caps (Ergocalciferol) .Marland Kitchen.. 1 By Mouth Every Week 6)  Multivitamins   Tabs (Multiple Vitamin) .Marland Kitchen.. 1 Tab Daily.Marland KitchenMarland Kitchen 7)  Lunesta 2 Mg Tabs (Eszopiclone) .Marland Kitchen.. 1 By Mouth At Bedtime As Needed Trouble Sleeping 8)  Cymbalta 60 Mg Cpep (Duloxetine Hcl) .... Take 1 Tab By Mouth Once Daily.Marland KitchenMarland Kitchen 9)  Protonix 40 Mg Pack (Pantoprazole Sodium) .... Take 1 Tablet By Mouth Once A Day 10)  Oxycodone Hcl 30 Mg Tabs (Oxycodone Hcl) .... Take 1 Tablet By Mouth Three Times A Day As Needed 11)  Orphenadrine Citrate Cr 100 Mg Xr12h-Tab (Orphenadrine Citrate) .... Take 1 Tab By Mouth At Bedtime For Muscle Relaxation 12)  Cyanocobalamin 1000 Mcg/ml Soln (Cyanocobalamin) .... Injection Once A Month 13)  Oxycodone-Acetaminophen 5-325 Mg Tabs (Oxycodone-Acetaminophen) .... Take 1 Tablet  By Mouth Three Times A Day As Needed  Allergies (verified): 1)  ! Sulfa 2)  ! * Tequin 3)  ! Lipitor (Atorvastatin Calcium)  Past History:  Past Medical History: Last updated: 08/06/2009 BRONCHITIS, RECURRENT (ICD-491.9) CIGARETTE SMOKER (ICD-305.1) VENOUS INSUFFICIENCY (ICD-459.81) HYPERCHOLESTEROLEMIA (ICD-272.0) REFLUX ESOPHAGITIS (ICD-530.11) DIVERTICULOSIS OF COLON (ICD-562.10) IRRITABLE BOWEL SYNDROME (ICD-564.1) COLONIC POLYPS (ICD-211.3) CALCULUS, KIDNEY (ICD-592.0) BACK PAIN (ICD-724.5) OSTEOPENIA (ICD-733.90) HEADACHE (ICD-784.0) VERTIGO (ICD-780.4) DYSTHYMIA (ICD-300.4)  Past Surgical History: Last updated: 08/06/2009 Appendectomy Tubal ligation - DrHolland Back Surgery- 10/09 by DrCohen  Family History: Last updated: 2007/12/28 Father:  died age 40 w/ colon ca & lung mets; hx prostate ca aswell. Mother:  [name], age 42 w/ hx heart dz & prev MVR; also has DJD. 3 Sibs:  2Bro & 1Sis - 1Bro w/ colon ca recently dx'd & awaiting surgin [place]; sis w/ hx skin squam cell ca's.  Social History: Last updated: 12/28/07 Married to husb for 30 yrs   4 children - all in good health  Works for [employer] Rec [employer] She has BS degreein Horiculture from [place].  +Smoker - starting in 20's til present, up to 1&1/2 ppd for a 30+ pack year hx. Social Etoh, no drugs.  Risk Factors: Smoking Status: current (10/07/2007) Packs/Day: 1.0 (10/02/2009)  Review of Systems      See HPI  Vital Signs:  Patient profile:   60 year old female Height:      64 inches Weight:      118.13 pounds BMI:     20.35 O2 Sat:      92 % on Room air Temp:     97.7 degrees F oral Pulse rate:   78 / minute BP sitting:   128 / 78  (left arm) Cuff size:   regular  Vitals Entered By: Boone Master CNA (November 05, 2009 4:21 PM)  O2 Flow:  Room air CC: sinus pressure/congestion with PND, chest congestion with dry cough, increased SOB, wheezing, body aches, increased fatigue x6days Is Patient Diabetic? No Comments Medications reviewed with patient Daytime contact number verified with patient. Boone Master CNA  November 05, 2009 4:23 PM    Physical Exam  Additional Exam:  General: A/Ox3; pleasant and cooperative SKIN: no rash, lesions NODES: no lymphadenopathy HEENT: [place]/AT, EOM- WNL NECK: Supple w/ fair ROM, JVD- none, normal carotid impulses w/o bruits Thyroid CHEST: Coarse BS w/ no wheezing.  HEART: RRR, no m/g/r heard ABDOMEN:  OZH:YQMV, nl pulses, no edema  NEURO: Grossly intact to observation      Impression & Recommendations:  Problem # 1:  BRONCHITIS, RECURRENT (ICD-491.9)  Encouraged on smoking cesstation  REC:  Augmentin 875mg  two times a day  Mucinex DM two times a day as needed cough/congestion  Saline nasal rinses as needed  Please contact office for sooner follow up if symptoms do not improve or worsen   Orders: Est. Patient Level III (78469)  Medications Added to Medication List This Visit: 1)  Augmentin 875-125 Mg Tabs (Amoxicillin-pot clavulanate) .Marland Kitchen.. 1 by mouth two times a day 2)  Hydromet 5-1.5 Mg/69ml Syrp (Hydrocodone-homatropine) .Marland Kitchen.. 1-2 tsp  every 4-6 hrs as needed cough, may make you sleepy  Complete Medication List: 1)  Lasix 20 Mg Tabs (Furosemide) .Marland Kitchen.. 1 by mouth once daily as needed leg swelling 2)  K-tabs 10 Meq Tbcr (Potassium chloride) .Marland Kitchen.. 1 by mouth once daily  with lasix as needed for leg swelling. 3)  Levbid 0.375 Mg Tb12 (Hyoscyamine sulfate) .Marland KitchenMarland KitchenMarland Kitchen 1  tab by mouth two times a day as needed for abd cramping... 4)  Promethazine Hcl 25 Mg Tabs (Promethazine hcl) .Marland Kitchen.. 1 tab by mouth q6h as needed for nausea.Marland KitchenMarland Kitchen 5)  Vitamin D 16109 Unit Caps (Ergocalciferol) .Marland Kitchen.. 1 by mouth every week 6)  Multivitamins Tabs (Multiple vitamin) .Marland Kitchen.. 1 tab daily.Marland KitchenMarland Kitchen 7)  Lunesta 2 Mg Tabs (Eszopiclone) .Marland Kitchen.. 1 by mouth at bedtime as needed trouble sleeping 8)  Cymbalta 60 Mg Cpep (Duloxetine hcl) .... Take 1 tab by mouth once daily.Marland KitchenMarland Kitchen 9)  Protonix 40 Mg Pack (Pantoprazole sodium) .... Take 1 tablet by mouth once a day 10)  Oxycodone Hcl 30 Mg Tabs (Oxycodone hcl) .... Take 1 tablet by mouth three times a day as needed 11)  Orphenadrine Citrate Cr 100 Mg Xr12h-tab (Orphenadrine citrate) .... Take 1 tab by mouth at bedtime for muscle relaxation 12)  Cyanocobalamin 1000 Mcg/ml Soln (Cyanocobalamin) .... Injection once a month 13)  Oxycodone-acetaminophen 5-325 Mg Tabs (Oxycodone-acetaminophen) .... Take 1 tablet by mouth three times a day as needed 14)  Augmentin 875-125 Mg Tabs (Amoxicillin-pot clavulanate) .Marland Kitchen.. 1 by mouth two times a day 15)  Hydromet 5-1.5 Mg/58ml Syrp (Hydrocodone-homatropine) .Marland Kitchen.. 1-2 tsp every 4-6 hrs as needed cough, may make you sleepy  Patient Instructions: 1)  Augmentin 875mg  two times a day  2)  Mucinex DM two times a day as needed cough/congestion  3)  Saline nasal rinses as needed  4)  Please contact office for sooner follow up if symptoms do not improve or worsen  Prescriptions: HYDROMET 5-1.5 MG/5ML SYRP (HYDROCODONE-HOMATROPINE) 1-2 tsp every 4-6 hrs as needed cough, may make you sleepy  #8 oz x 0   Entered and  Authorized by:   Rubye Oaks NP   Signed by:   Hamza Empson NP on 11/05/2009   Method used:   Print then Give to Patient   RxID:   [id number] AUGMENTIN 875-125 MG TABS (AMOXICILLIN-POT CLAVULANATE) 1 by mouth two times a day  #14 x 0   Entered and Authorized by:   Rubye Oaks NP   Signed by:   Salaam Battershell NP on 11/05/2009   Method used:   Electronically to        [facility]  [address] [address] [id number]       [address]       [place], [place]  [address]       Ph: [phone]       Fax: [phone]   RxID:   [id number]   Appended Document: B12 injection documentation     Clinical Lists Changes  Orders: Added new Service order of Vit B12 1000 mcg (V9563) - Signed Added new Service order of Admin of Therapeutic Inj  intramuscular or subcutaneous (87564) - Signed       Medication Administration  Injection # 1:    Medication: Vit B12 1000 mcg    Diagnosis: VITAMIN B12 DEFICIENCY (ICD-266.2)    Route: IM    Site: L deltoid    Exp Date: 07-2010    Lot #: [id number]    Mfr: [employer]    Comments: pt seen by Rubye Oaks in the office for OV. injection administered by Dimas Millin in allergy lab.    Patient tolerated injection without complications  Orders Added: 1)  Vit B12 1000 mcg [J3420] 2)  Admin of Therapeutic Inj  intramuscular or subcutaneous [33295]

## 2011-01-27 NOTE — Assessment & Plan Note (Signed)
Summary: 4-6 weeks/apc   Chief Complaint:  follow up - co complaints.  History of Present Illness: 60 year old female with known history of [name], chronic low back pain with DDD followed by Dr. Jethro Bolus  and Hyperlipidemia    3/09: 1)  FLP was abn w/ TChol 250, LDL 156... Simvastatin 20mg /d started, but she reports intolerant... therefore she went on low chol/ low fat diet and repeat FLP 5/09 showed TChol 205, LDL 102 = much improved, therefore continue diet efforts... and 2) VitD level was low at 12 (30-90) so VitD 50K per week     04/12/08--.Recently seen for extremity edema  worse in afternoons. Given Lasix with some improvement. Sent for venous dopplers that were negative for DVT.  Also felt  Lexapro is not working, Changed to Cymbalta and does feel some better. Recently traveled to [facility], stood up and left leg gave away with subsequent fall into bench/ newspapaer stand striking back and hip. Went to [facility] ER. Reports xray negative. Currently seeing ortho/neuro/pain spec for DDD/DJD, scheduled for back surgery for fusion of L4-5.   05/12/08 Recent UTI tx with cipro,    ~  seen 8/09 w/ acute URI/ gastroenteritis symptoms- CXR was clear & labs normal... Rx w/ Fluids, rest, Tylenol, Levsin, Phenergan, etc... she was out of work 2+weeks but finally better...  11/09/2008--Had back surgery on 10/01/08."vrey slow on recovery" complains still pressure in back, pain  and has been having low grade temp at night. Complains she feels somewhat depressed, sad at times, low interest, mood swings. no suicidal ideation.  --cybalta increased to 60mg  once daily   December 11, 2008--returns for follow up. Pt is doing better. starting to get stronger but still has right much back pain, seen Dr. Noel Gerold last week with steroid injection and steroid taper.Not much help. Mood is better but concerned that her libidio is so down. Husband very upset. She is considering tapering off xanax to see if this will help. Denies  chest pain, dyspnea, orthopnea, hemoptysis, fever, n/v/d, edema, suicidal/homicidal ideations.             Prior Medications Reviewed Using: Patient Recall  Updated Prior Medication List: LASIX 20 MG  TABS (FUROSEMIDE) 1 by mouth once daily as needed leg swelling K-TABS 10 MEQ  TBCR (POTASSIUM CHLORIDE) 1 by mouth once daily  with Lasix as needed for leg swelling. PROTONIX 40 MG  TBEC (PANTOPRAZOLE SODIUM) Take 1 tablet by mouth once a day LEVBID 0.375 MG  TB12 (HYOSCYAMINE SULFATE) 1 tab by mouth two times a day as needed for abd cramping... PROMETHAZINE HCL 25 MG  TABS (PROMETHAZINE HCL) 1 tab by mouth Q6H as needed for nausea... EVISTA 60 MG  TABS (RALOXIFENE HCL) Take 1 tablet by mouth once a day VITAMIN D 66063 UNIT  CAPS (ERGOCALCIFEROL) 1 by mouth EVERY WEEK MULTIVITAMINS   TABS (MULTIPLE VITAMIN) 1 tab daily... CYMBALTA 60 MG CPEP (DULOXETINE HCL) 1 by mouth once daily ALPRAZOLAM 0.5 MG  TABS (ALPRAZOLAM) Take 1 tab by mouth three times a day as needed for nerves OXYCODONE-ACETAMINOPHEN 10-325 MG TABS (OXYCODONE-ACETAMINOPHEN) 1 tab every 4-6 hours as needed for pain METHOCARBAMOL 500 MG TABS (METHOCARBAMOL) three times a day LYRICA 75 MG CAPS (PREGABALIN) 1-2 capsules by mouth two times a day  Current Allergies (reviewed today): ! SULFA ! * TEQUIN ! LIPITOR (ATORVASTATIN CALCIUM)  Past Medical History:    VERTIGO (ICD-780.4) - Hosp 8/08 w/ neg eval and eventually referred to Neurology w/ nothing found;  Rx'd w/ Antivert, Phenergan, vestib therapy...        BRONCHITIS, RECURRENT (ICD-491.9) - 1ppd smoker w/ hx recurrent bronchitis... counselled to quit.        REFLUX ESOPHAGITIS (ICD-530.11) - EGD 9/05 by DrPerry showed mod severe reflux esoph w/ erosions, Rx PROTONIX regularly w/ good control, but states she needs it Bid...        DIVERTICULOSIS OF COLON (ICD-562.10), &  [name] (ICD-564.1) - she takes LEVBID Bidprn...        COLONIC POLYPS (ICD-211.3) - Last colonoscopy  2/06 by [name] showed Divertic, otherw neg; f/u 23yrs w/ +fam hx colon ca.        CALCULUS, KIDNEY (ICD-592.0) - Hx of Kidney Stones & small renal cysts on CT scan & sonar - followed by DrWrenn.        BACK PAIN, LUMBAR (ICD-724.2) - eval and rx by DrVoytek w/ DDD and no surg possible... she's had series of epidural shots from DrStrong without relief... being referred to a chronic pain clinic...--s/p back surgery with Dr. Noel Gerold        OSTEOPENIA (ICD-733.90) - she is on EVISTA, Ca++, Vits... w/ BMD per GYN        HEADACHE (ICD-784.0)        ANXIETY (ICD-300.00) - she is on LEXAPRO 10mg /d, and XANAX 0.5mg - 2tabQhs...        HEALTH MAINTENANCE:  GYN= DrHolland & PAP, Mammogram, etc... all up-to-date; she is due a BMD in 1/10; and colon 10/11...    H1N1 December 11, 2008                 Family History:    Reviewed history from 12/06/2007 and no changes required:       Father:  died age 42 w/ colon ca & lung mets; hx prostate ca aswell.       Mother:  [name], age 75 w/ hx heart dz & prev MVR; also has DJD.       3 Sibs:  2Bro & 1Sis - 1Bro w/ colon ca recently dx'd & awaiting surgin [place]; sis w/ hx skin squam cell ca's.  Social History:    Reviewed history from 12/06/2007 and no changes required:       Married to husb for 30 yrs         4 children - all in good health       Works for [employer]       She has BS degreein Horiculture from [employer].              +Smoker - starting in 20's til present, up to 1&1/2 ppd for a 30+ pack year hx.       Social Etoh, no drugs.   Risk Factors:  Tobacco use:  current    Cigarettes:  Yes -- 1/2 pack(s) per day   Review of Systems      See HPI   Vital Signs:  Patient Profile:   60 Years Old Female Weight:      128.56 pounds O2 Sat:      97 % O2 treatment:    Room Air Temp:     97.1 degrees F oral Pulse rate:   87 / minute BP sitting:   110 / 72  (left arm) Cuff size:   regular  Vitals Entered By: Boone Master CNA  (December 11, 2008 4:15 PM)             Is Patient Diabetic?  No Comments Medications reviewed with patient Boone Master CNA  December 11, 2008 4:15 PM      Physical Exam  WD, WN, 60 y/o WF in NAD... GENERAL:  Alert & oriented; pleasant & cooperative... HEENT:  [place]/AT, EOM-wnl, PERRLA,  EACs-clear, TMs-wnl, NOSE-clear, THROAT-clear & wnl. NECK:  Supple w/ fairROM; no JVD; normal carotid impulses w/o bruits; no thyromegaly or nodules palpated; no lymphadenopathy. CHEST:  Clear to P & A; without wheezes/ rales/ or rhonchi heard... HEART:  Regular Rhythm; without murmurs/ rubs/ or gallops detected... ABDOMEN:  Soft & nontender; normal bowel sounds; no organomegaly or masses palpated... EXT: without deformities, mild arthritic changes; no varicose veins/ +venous insuffic/ no edema.,   back brace in place        Impression & Recommendations:  Problem # 1:  DYSTHYMIA (ICD-300.4) Improved with cybalta.  advised on stress reducers. offered referral to counselor, marriage counseling.-pt declined at this time.  Please contact office for sooner follow up if symptoms do not improve or worsen  Orders: Est. Patient Level II (98119)   Problem # 2:  BACK PAIN (ICD-724.5) s/p surgery-continue follow up with Dr. Noel Gerold.  Orders: Est. Patient Level II (14782)   Medications Added to Medication List This Visit: 1)  Lyrica 75 Mg Caps (Pregabalin) .Marland Kitchen.. 1-2 capsules by mouth two times a day  Complete Medication List: 1)  Lasix 20 Mg Tabs (Furosemide) .Marland Kitchen.. 1 by mouth once daily as needed leg swelling 2)  K-tabs 10 Meq Tbcr (Potassium chloride) .Marland Kitchen.. 1 by mouth once daily  with lasix as needed for leg swelling. 3)  Protonix 40 Mg Tbec (Pantoprazole sodium) .... Take 1 tablet by mouth once a day 4)  Levbid 0.375 Mg Tb12 (Hyoscyamine sulfate) .Marland Kitchen.. 1 tab by mouth two times a day as needed for abd cramping... 5)  Promethazine Hcl 25 Mg Tabs (Promethazine hcl) .Marland Kitchen.. 1 tab by mouth q6h as needed for  nausea.Marland KitchenMarland Kitchen 6)  Evista 60 Mg Tabs (Raloxifene hcl) .... Take 1 tablet by mouth once a day 7)  Vitamin D 95621 Unit Caps (Ergocalciferol) .Marland Kitchen.. 1 by mouth every week 8)  Multivitamins Tabs (Multiple vitamin) .Marland Kitchen.. 1 tab daily.Marland KitchenMarland Kitchen 9)  Cymbalta 60 Mg Cpep (Duloxetine hcl) .Marland Kitchen.. 1 by mouth once daily 10)  Alprazolam 0.5 Mg Tabs (Alprazolam) .... Take 1 tab by mouth three times a day as needed for nerves 11)  Oxycodone-acetaminophen 10-325 Mg Tabs (Oxycodone-acetaminophen) .Marland Kitchen.. 1 tab every 4-6 hours as needed for pain 12)  Methocarbamol 500 Mg Tabs (Methocarbamol) .... Three times a day 13)  Lyrica 75 Mg Caps (Pregabalin) .Marland Kitchen.. 1-2 capsules by mouth two times a day  Other Orders: H1N1 vaccine (H0865) Influenza A (H1N1) w/ Phy couseling (H8469)   Patient Instructions: 1)  H1N1 today 2)  Continue on same meds.  3)  follow up Cohen as scheduled.  4)  May use Xanax as needed  5)  follow up 2-3 months Dr. Kriste Basque  6)  Please contact office for sooner follow up if symptoms do not improve or worsen        Flu Vaccine Consent Questions     Do you have a history of severe allergic reactions to this vaccine? no    Any prior history of allergic reactions to egg and/or gelatin? no    Do you have a sensitivity to the preservative Thimersol? no    Do you have a past history of Guillan-Barre Syndrome? no    Do you currently have an acute febrile illness?  no    Have you ever had a severe reaction to latex? no    Vaccine information given and explained to patient? yes    Are you currently pregnant? no   Do you have Asthma? no   Lot Number: [id number]   Exp Date:06/26/2009   Site Given Left Deltoid Boone Master CNA  December 11, 2008 5:21 PM  ]

## 2011-01-29 NOTE — Letter (Signed)
Summary: Appt Reminder 2  [facility]  [address] [place], [place] [address]   Phone: [phone]  Fax: [phone]        January 08, 2011 MRN: [id number]    Denise King [address] [place], [place]  [address]    Dear [name],   You have a return appointment with Dr. [name] on 02/11/11 at 9:15am.  Please remember to bring a complete list of the medicines you are taking, your insurance card and your co-pay.  If you have to cancel or reschedule this appointment, please call before 5:00 pm the evening before to avoid a cancellation fee.  If you have any questions or concerns, please call [phone].    Sincerely,    Selinda Michaels RN  Appended Document: Appt Reminder 2 Letter is mailed to the patient's home address

## 2011-01-29 NOTE — Progress Notes (Signed)
Summary: Triage   Phone Note Call from Patient Call back at Home Phone [phone] Call back at [phone]   Caller: Patient Call For: Dr. [name] Reason for Call: Talk to Nurse Summary of Call: constant diarrhea and sharps pain in left side just started yesterday, wants to speak with nurse Initial call taken by: [place] Johnson,  December 31, 2010 12:01 PM  Follow-up for Phone Call        Left message to call back Follow-up by: Selinda Michaels RN,  December 31, 2010 3:03 PM  Additional Follow-up for Phone Call Additional follow up Details #1::        Appt made for patient to see Mike Gip, PA 01/01/11 @9am . Additional Follow-up by: Selinda Michaels RN,  December 31, 2010 3:48 PM

## 2011-01-29 NOTE — Procedures (Signed)
Summary: Colonoscopy  Patient: Denise King Note: All result statuses are Final unless otherwise noted.  Tests: (1) Colonoscopy (COL)   COL Colonoscopy           DONE     [facility]     [address]     [place], [place]  [address]           COLONOSCOPY PROCEDURE REPORT           PATIENT:  Denise, King  MR#:  [id number]     BIRTHDATE:  [birth year], 59 yrs. old  GENDER:  female           ENDOSCOPIST:  Barbette Hair. [name], MD     Referred by:           PROCEDURE DATE:  01/07/2011     PROCEDURE:  Colonoscopy with biopsy     ASA CLASS:  Class II     INDICATIONS:  1) unexplained diarrhea           MEDICATIONS:   MAC sedation, administered by CRNA           DESCRIPTION OF PROCEDURE:   After the risks benefits and     alternatives of the procedure were thoroughly explained, informed     consent was obtained.  Digital rectal exam was performed and     revealed no abnormalities.   The [id number] endoscope was     introduced through the anus and advanced to the cecum, which was     identified by both the appendix and ileocecal valve, without     limitations.  The quality of the prep was good, using MoviPrep.     The instrument was then slowly withdrawn as the colon was fully     examined.     <<PROCEDUREIMAGES>>           FINDINGS:  Moderate diverticulosis was found in the sigmoid colon     (see image6).  This was otherwise a normal examination of the     colon (see image1, image2, image3, and image4).  Random biopsies     were taken throughout the colon to r/o microscopic colitis.     Unable to retroflex (small rectal vault).    The time to cecum =  4.50     minutes. The scope was then withdrawn (time =  6.25  min) from the     patient and the procedure completed.           COMPLICATIONS:  None           ENDOSCOPIC IMPRESSION:     1) Moderate diverticulosis in the sigmoid colon     2) Otherwise normal examination     RECOMMENDATIONS:     1) Await biopsy  results     2) call office next 1-3 days to schedule followup visit in 2     weeks           REPEAT EXAM:  No           ______________________________     Barbette Hair. [name], MD           CC: Michele Mcalpine, MD           n.     Denise King:   Barbette Hair. Kaplan at 01/07/2011 03:28 PM           Denise King, [id number]  Note: An exclamation mark (!) indicates a result that was not dispersed into  the flowsheet. Document Creation Date: 01/07/2011 3:28 PM _______________________________________________________________________  (1) Order result status: Final Collection or observation date-time: 01/07/2011 15:23 Requested date-time:  Receipt date-time:  Reported date-time:  Referring Physician:   Ordering Physician: Melvia Heaps [phone]) Specimen Source:  Source: Launa Grill Order Number: [id number] Lab site:

## 2011-01-29 NOTE — Letter (Signed)
Summary: Results Letter  [facility]  [address] [place], [place] [address]   Phone: [phone]  Fax: [phone]        January 13, 2011 MRN: [id number]    Denise King [address] [place], [place]  [address]    Dear Ms. Colan,  Your colon biopsy results did not show any remarkable findings.  Please continue with the recommendations previously discussed.  Should you have any further questions or immediate concers, feel free to contact me.  Sincerely,  Barbette Hair. [name], M.D., [facility]          Sincerely,  Louis Meckel MD  This letter has been electronically signed by your physician.  Appended Document: Results Letter Letter mailed

## 2011-01-29 NOTE — Letter (Signed)
Summary: [facility] Instructions  [facility]  [address] [place], [place] [address]   Phone: [phone]  Fax: [phone]       Denise King    17-Mar-1963    MRN: [id number]        Procedure Day /Date: 01-07-2011     Arrival Time:2:30 PM      Procedure Time: 3:30 PM     Location of Procedure:                    X     [facility] (4th Floor)   PREPARATION FOR COLONOSCOPY WITH MOVIPREP   Starting 5 days prior to your procedure 01-02-2011 do not eat nuts, seeds, popcorn, corn, beans, peas,  salads, or any raw vegetables.  Do not take any fiber supplements (e.g. Metamucil, Citrucel, and Benefiber).  THE DAY BEFORE YOUR PROCEDURE         DATE: 01-06-2011  DAY: Tuesday  1.  Drink clear liquids the entire day-NO SOLID FOOD  2.  Do not drink anything colored red or purple.  Avoid juices with pulp.  No orange juice.  3.  Drink at least 64 oz. (8 glasses) of fluid/clear liquids during the day to prevent dehydration and help the prep work efficiently.  CLEAR LIQUIDS INCLUDE: Water Jello Ice Popsicles Tea (sugar ok, no milk/cream) Powdered fruit flavored drinks Coffee (sugar ok, no milk/cream) Gatorade Juice: apple, white grape, white cranberry  Lemonade Clear bullion, consomm, broth Carbonated beverages (any kind) Strained chicken noodle soup Hard Candy                             4.  In the morning, mix first dose of MoviPrep solution:    Empty 1 Pouch A and 1 Pouch B into the disposable container    Add lukewarm drinking water to the top line of the container. Mix to dissolve    Refrigerate (mixed solution should be used within 24 hrs)  5.  Begin drinking the prep at 5:00 p.m. The MoviPrep container is divided by 4 marks.   Every 15 minutes drink the solution down to the next mark (approximately 8 oz) until the full liter is complete.   6.  Follow completed prep with 16 oz of clear liquid of your choice (Nothing red or purple).   Continue to drink clear liquids until bedtime.  7.  Before going to bed, mix second dose of MoviPrep solution:    Empty 1 Pouch A and 1 Pouch B into the disposable container    Add lukewarm drinking water to the top line of the container. Mix to dissolve    Refrigerate  THE DAY OF YOUR PROCEDURE      DATE: 01-07-2011 DAY: Wednesday  Beginning at 10:30 Am  (5 hours before procedure):         1. Every 15 minutes, drink the solution down to the next mark (approx 8 oz) until the full liter is complete.  2. Follow completed prep with 16 oz. of clear liquid of your choice.    3. You may drink clear liquids until 8:30 AM  (2 HOURS BEFORE PROCEDURE).   MEDICATION INSTRUCTIONS  Unless otherwise instructed, you should take regular prescription medications with a small sip of water   as early as possible the morning of your procedure.       OTHER INSTRUCTIONS  You will need a responsible adult at  least 58 years of age to accompany you and drive you home.   This person must remain in the waiting room during your procedure.  Wear loose fitting clothing that is easily removed.  Leave jewelry and other valuables at home.  However, you may wish to bring a book to read or  an iPod/MP3 player to listen to music as you wait for your procedure to start.  Remove all body piercing jewelry and leave at home.  Total time from sign-in until discharge is approximately 2-3 hours.  You should go home directly after your procedure and rest.  You can resume normal activities the  day after your procedure.  The day of your procedure you should not:   Drive   Make legal decisions   Operate machinery   Drink alcohol   Return to work  You will receive specific instructions about eating, activities and medications before you leave.    The above instructions have been reviewed and explained to me by   _______________________    I fully understand and can verbalize these instructions  _____________________________ Date _________

## 2011-01-29 NOTE — Assessment & Plan Note (Signed)
Summary: Diarrhea/pain/LRH    History of Present Illness Visit Type: Follow-up Visit Primary GI MD: Melvia Heaps MD [facility] Primary Provider: Alroy Dust, MD Requesting Provider: n/a Chief Complaint: LLQ sharp intermittant abd pains with some dull constant LLQ ache. Pt states her Nausea has increased and diarrhea has become more frequent the last several weeks with more mucus in her stool.  History of Present Illness:   60 YO FEMALE KNOWN TO DR. KAPLAN WITH HX OF [name],GERD, DIVERTICULOSIS, AND FAMILY HX OF BROTHER RECENTLY DX WITH COLON CANCER. SHE HAS A CHRONIC PAIN SYNDROME/BACK PAIN AND IS ON CHRONIC NARCOTICS. SHEWAS SEEN TWICE IN THE FALL 2011 WITH C/O LEFT SIDED ABDOMINAL PAIN, INTERMITTENT CRAMPING AND ONGOING LOOSE STOOLS. SHE GENERALLY HAS 3 BM'S /DAY WITH URGENCY. NO BLEEDING. SHE HAS BEEN TRIED ON ANTISPASMOTICS WITH LITTLE BENEFIT. SHE WAS GIVEN A TRIAL OF XANAX PER DR. KAPLAN LAST VISIT [facility] HELPS HER NERVES BUT DOES NOT HELP HER GI SXS. SHE SAYS SHE HAS MISSED WORK RECENTLY WITH STOMACH TROUBLE. SHE WAS SCHEDULED FOR A COLNOSCOPY IN 10/11 BUT HAD TO CANCEL AS SHE BROKE HER TIBIA. LAST PROCEDURE WAS A FLEX IN 2007 WHICH SHOWED DIVERTICULOSIS.   GI Review of Systems    Reports abdominal pain, bloating, and  nausea.     Location of  Abdominal pain: LLQ.    Denies acid reflux, belching, chest pain, dysphagia with liquids, dysphagia with solids, heartburn, loss of appetite, vomiting, vomiting blood, weight loss, and  weight gain.      Reports diarrhea.     Denies anal fissure, black tarry stools, constipation, diverticulosis, fecal incontinence, heme positive stool, hemorrhoids, irritable bowel syndrome, jaundice, light color stool, liver problems, rectal bleeding, and  rectal pain.    Current Medications (verified): 1)  Lasix 20 Mg  Tabs (Furosemide) .Marland Kitchen.. 1 By Mouth Once Daily As Needed Leg Swelling 2)  K-Tabs 10 Meq  Tbcr (Potassium Chloride) .Marland Kitchen.. 1 By Mouth Once Daily  With Lasix  As Needed For Leg Swelling. 3)  Protonix 40 Mg Tbec (Pantoprazole Sodium) .... Take 1 Tablet By Mouth Once A Day 4)  Promethazine Hcl 25 Mg  Tabs (Promethazine Hcl) .Marland Kitchen.. 1 Tab By Mouth Q6h As Needed For Nausea.Marland KitchenMarland Kitchen 5)  Cymbalta 60 Mg Cpep (Duloxetine Hcl) .... Take 1 Tab By Mouth Once Daily.Marland KitchenMarland Kitchen 6)  Opana Er 5 Mg Xr12h-Tab (Oxymorphone Hcl) .... Take 1-2 Tablets By Mouth Every 6 Hours As Needed For Breakthough Pain 7)  Multivitamins   Tabs (Multiple Vitamin) .Marland Kitchen.. 1 Tab Daily.Marland KitchenMarland Kitchen 8)  Vitamin D 1000 Unit Tabs (Cholecalciferol) .... Take 1 Tab By Mouth Once Daily.Marland KitchenMarland Kitchen 9)  Percocet 7.5-325 Mg Tabs (Oxycodone-Acetaminophen) .... One Tablet By Mouth Four Times Daily 10)  Hyoscyamine Sulfate Cr 0.375 Mg Xr12h-Tab (Hyoscyamine Sulfate) .... One Tablet By Mouth Two Times A Day As Needed For Cramps 11)  Unisom 25 Mg Tabs (Doxylamine Succinate (Sleep)) .Marland Kitchen.. 1 At Bedtime 12)  Alprazolam 0.5 Mg Tabs (Alprazolam) .... Take One To 2 Tabs Q.6 H. P.r.n. 13)  Reclast 5 Mg/111ml Soln (Zoledronic Acid) .... Once A Year For Osteoporosis 14)  Librax 2.5-5 Mg Caps (Clidinium-Chlordiazepoxide) .... Take 1 Tab 3 Times Daily 15)  Align  Caps (Probiotic Product) .... Take 1 Capsule Daily For 28 Days  Allergies (verified): 1)  ! Sulfa 2)  ! * Tequin 3)  ! Lipitor (Atorvastatin Calcium)  Past History:  Past Medical History: Reviewed history from 02/05/2010 and no changes required.  BRONCHITIS, RECURRENT (ICD-491.9) CIGARETTE SMOKER (ICD-305.1) VENOUS INSUFFICIENCY (  ICD-459.81) GERD (ICD-530.81) REFLUX ESOPHAGITIS (ICD-530.11) DIVERTICULOSIS OF COLON (ICD-562.10) IRRITABLE BOWEL SYNDROME (ICD-564.1) COLONIC POLYPS (ICD-211.3) CALCULUS, KIDNEY (ICD-592.0) BACK PAIN (ICD-724.5) CHRONIC PAIN SYNDROME (ICD-338.4) OSTEOPENIA (ICD-733.90) HEADACHE (ICD-784.0) VERTIGO (ICD-780.4) DYSTHYMIA (ICD-300.4) DEPRESSION (ICD-311) VITAMIN B12 DEFICIENCY (ICD-266.2)  Past Surgical History: Reviewed history from 02/05/2010 and  no changes required. Appendectomy Tubal ligation - DrHolland Back Surgery- 10/09 by DrCohen Tonsillectomy  Family History: Reviewed history from 01/27/2010 and no changes required. Father:  died age 22 w/ colon ca & lung mets; hx prostate ca aswell. Mother:  [name], age 41 w/ hx heart dz & prev MVR; also has DJD. 3 Sibs:  2Bro & 1Sis -  1Bro w/ colon ca recently dx'd & awaiting surgin Singac;  sis w/ hx skin squam cell ca's. Family History of Colon Polyps: Brother  Social History: Reviewed history from 01/27/2010 and no changes required. 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 +Smoker - starting in 20's til present, up to 1&1/2 ppd for a 30+ pack year hx. no drugs. Alcohol Use - no Illicit Drug Use - no  Review of Systems  The patient denies allergy/sinus, anemia, anxiety-new, arthritis/joint pain, back pain, blood in urine, breast changes/lumps, change in vision, confusion, cough, coughing up blood, depression-new, fainting, fatigue, fever, headaches-new, hearing problems, heart murmur, heart rhythm changes, itching, menstrual pain, muscle pains/cramps, night sweats, nosebleeds, pregnancy symptoms, shortness of breath, skin rash, sleeping problems, sore throat, swelling of feet/legs, swollen lymph glands, thirst - excessive , urination - excessive , urination changes/pain, urine leakage, vision changes, and voice change.         SEE HPI  Vital Signs:  Patient profile:   60 year old female Height:      64 inches Weight:      128.38 pounds BMI:     22.12 Pulse rate:   86 / minute Pulse rhythm:   regular BP sitting:   122 / 72  (left arm) Cuff size:   regular  Vitals Entered By: Christie Nottingham CMA Duncan Dull) (January 01, 2011 9:17 AM)  Physical Exam  General:  Well developed, well nourished, no acute distress., THIN Head:  Normocephalic and atraumatic. Eyes:  PERRLA, no  icterus. Lungs:  Clear throughout to auscultation. Heart:  Regular rate and rhythm; no murmurs, rubs,  or bruits. Abdomen:  SOFT, MILD TENDERNESS LMQ, NO GUARDING, NO MASS, NO HSM,,BS+ Rectal:  NOT DONE Extremities:  No clubbing, cyanosis, edema or deformities noted. Neurologic:  Alert and  oriented x4;  grossly normal neurologically. Psych:  anxious.     Impression & Recommendations:  Problem # 1:  ABDOMINAL PAIN, LEFT LOWER QUADRANT (ICD-789.04) Assessment Unchanged 59 YO FEMALE WITH LEFT SIDED ABDOMINAL P[AIN, AND DIARRHEA-CHRONIC. SXS ARE LIKELY SECONDARY TO [name]- R/O MICROSCOPIC  COLITIS /IBD,VS SYMPTOMATIC DIVERTICULOSIS.  SCHEDULE FOR COLONOSCOPY WITH DR. KAPLAN -PROCEDURE DISCUSSED IN DETAIL  WITH PT.PROPOFOL SEDATION GIVEN CHRONINC NARCOTIC USE  TRIAL OF ALIGN ONE DAILY X 30 DAYS TRIAL OF LIBRAX ON E  3 TIMES DAILY WITH MEALS  Orders: Colonoscopy (Colon)  Problem # 2:  FAMILY HX COLON CANCER (ICD-V16.0) Assessment: Comment Only BROTHER- SEE ABOVE  Problem # 3:  CIGARETTE SMOKER (ICD-305.1) Assessment: Comment Only  Problem # 4:  CHRONIC PAIN SYNDROME (ICD-338.4) Assessment: Comment Only CHRONIC NARCOTIC USE  Problem # 5:  DEPRESSION (ICD-311) Assessment: Comment Only /ANXIETY-PT ASKS FOR A REFILL OF Prudy Feeler -  WILL GIVE LIMITED SUPPLY-FURTHER REFILLS PER DR. KAPLAN  Patient Instructions: 1)  We scheduled the colonoscopy with Dr. [name] for 01-07-2011 w/ PROPOFOL. 2)  We sent perscriptions for Librax to [facility].  3)  We have given you samples of Align capsules to take 1 daily for 4 weeks.  4)  Copy sent to : Olean Ree, Md 5)  The medication list was reviewed and reconciled.  All changed / newly prescribed medications were explained.  A complete medication list was provided to the patient / caregiver. Prescriptions: ALPRAZOLAM 0.5 MG TABS (ALPRAZOLAM) take one to 2 tabs q.6 h. p.r.n.  #20 x 0   Entered by:   Lowry Ram NCMA   Authorized by:   Sammuel Cooper  PA-c   Signed by:   Lowry Ram NCMA on 01/01/2011   Method used:   Printed then faxed to ...       [id number] (retail)       [address]       [place], [place]  [address]       Ph: [phone] or [phone]       Fax: [phone]   RxID:   [id number] LIBRAX 2.5-5 MG CAPS (CLIDINIUM-CHLORDIAZEPOXIDE) Take 1 tab 3 times daily  #90 x 1   Entered by:   Lowry Ram NCMA   Authorized by:   Sammuel Cooper PA-c   Signed by:   Lowry Ram NCMA on 01/01/2011   Method used:   Electronically [facility]        [facility] (retail)       [address]       [place], [place]  [address]       Ph: [phone] or [phone]       Fax: [phone]   RxID:   [id number] MOVIPREP 100 GM  SOLR (PEG-KCL-NACL-NASULF-NA ASC-C) As per prep instructions.  #1 x 0   Entered by:   Lowry Ram NCMA   Authorized by:   Sammuel Cooper PA-c   Signed by:   Lowry Ram NCMA on 01/01/2011   Method used:   Elect[facility]ically [facility]        [facility] (retail)       [address]       [place], [place]  [address]       Ph: [phone] or [phone]       Fax: [phone]   RxID:   [id number]  The patient already has the prep at home .  I was unaware.

## 2011-01-30 NOTE — Letter (Signed)
Summary: Medical Psychotherapist  Medical Psychotherapist   Imported By: Sherian Rein 08/14/2009 08:09:19  _____________________________________________________________________  External Attachment:    Type:   Image     Comment:   External Document

## 2011-02-03 ENCOUNTER — Ambulatory Visit (INDEPENDENT_AMBULATORY_CARE_PROVIDER_SITE_OTHER): Payer: 59 | Admitting: Pulmonary Disease

## 2011-02-03 ENCOUNTER — Other Ambulatory Visit: Payer: Self-pay | Admitting: Pulmonary Disease

## 2011-02-03 ENCOUNTER — Ambulatory Visit (INDEPENDENT_AMBULATORY_CARE_PROVIDER_SITE_OTHER)
Admission: RE | Admit: 2011-02-03 | Discharge: 2011-02-03 | Disposition: A | Discharge status: Home / Self Care | Payer: 59 | Source: Ambulatory Visit | Attending: Pulmonary Disease | Admitting: Pulmonary Disease

## 2011-02-03 ENCOUNTER — Encounter: Payer: Self-pay | Admitting: Pulmonary Disease

## 2011-02-03 ENCOUNTER — Other Ambulatory Visit: Payer: 59

## 2011-02-03 DIAGNOSIS — J42 Unspecified chronic bronchitis: Secondary | ICD-10-CM

## 2011-02-03 DIAGNOSIS — K5730 Diverticulosis of large intestine without perforation or abscess without bleeding: Secondary | ICD-10-CM

## 2011-02-03 DIAGNOSIS — F17200 Nicotine dependence, unspecified, uncomplicated: Secondary | ICD-10-CM

## 2011-02-03 DIAGNOSIS — R1032 Left lower quadrant pain: Secondary | ICD-10-CM

## 2011-02-03 DIAGNOSIS — E538 Deficiency of other specified B group vitamins: Secondary | ICD-10-CM

## 2011-02-03 DIAGNOSIS — I872 Venous insufficiency (chronic) (peripheral): Secondary | ICD-10-CM

## 2011-02-03 DIAGNOSIS — F3289 Other specified depressive episodes: Secondary | ICD-10-CM

## 2011-02-03 DIAGNOSIS — R197 Diarrhea, unspecified: Secondary | ICD-10-CM

## 2011-02-03 DIAGNOSIS — M949 Disorder of cartilage, unspecified: Secondary | ICD-10-CM

## 2011-02-03 DIAGNOSIS — F329 Major depressive disorder, single episode, unspecified: Secondary | ICD-10-CM

## 2011-02-03 DIAGNOSIS — D126 Benign neoplasm of colon, unspecified: Secondary | ICD-10-CM

## 2011-02-03 DIAGNOSIS — M899 Disorder of bone, unspecified: Secondary | ICD-10-CM

## 2011-02-03 DIAGNOSIS — K589 Irritable bowel syndrome without diarrhea: Secondary | ICD-10-CM

## 2011-02-03 DIAGNOSIS — K219 Gastro-esophageal reflux disease without esophagitis: Secondary | ICD-10-CM

## 2011-02-03 LAB — HEPATIC FUNCTION PANEL
ALT: 13 U/L (ref 0–35)
AST: 30 U/L (ref 0–37)
Albumin: 3.6 g/dL (ref 3.5–5.2)
Alkaline Phosphatase: 80 U/L (ref 39–117)
Bilirubin, Direct: 0.2 mg/dL (ref 0.0–0.3)
Total Bilirubin: 0.7 mg/dL (ref 0.3–1.2)
Total Protein: 6.6 g/dL (ref 6.0–8.3)

## 2011-02-03 LAB — CBC WITH DIFFERENTIAL/PLATELET
Basophils Absolute: 0.1 10*3/uL (ref 0.0–0.1)
Basophils Relative: 0.6 % (ref 0.0–3.0)
Eosinophils Absolute: 0.1 10*3/uL (ref 0.0–0.7)
Eosinophils Relative: 0.9 % (ref 0.0–5.0)
HCT: 38.1 % (ref 36.0–46.0)
Hemoglobin: 13.2 g/dL (ref 12.0–15.0)
Lymphocytes Relative: 37.9 % (ref 12.0–46.0)
Lymphs Abs: 4.2 10*3/uL — ABNORMAL HIGH (ref 0.7–4.0)
MCHC: 34.5 g/dL (ref 30.0–36.0)
MCV: 105.7 fl — ABNORMAL HIGH (ref 78.0–100.0)
Monocytes Absolute: 0.7 10*3/uL (ref 0.1–1.0)
Monocytes Relative: 6.4 % (ref 3.0–12.0)
Neutro Abs: 6 10*3/uL (ref 1.4–7.7)
Neutrophils Relative %: 54.2 % (ref 43.0–77.0)
Platelets: 314 10*3/uL (ref 150.0–400.0)
RBC: 3.61 Mil/uL — ABNORMAL LOW (ref 3.87–5.11)
RDW: 14.2 % (ref 11.5–14.6)
WBC: 11.1 10*3/uL — ABNORMAL HIGH (ref 4.5–10.5)

## 2011-02-03 LAB — IBC PANEL
Iron: 157 ug/dL — ABNORMAL HIGH (ref 42–145)
Saturation Ratios: 48.3 % (ref 20.0–50.0)
Transferrin: 232 mg/dL (ref 212.0–360.0)

## 2011-02-03 LAB — SEDIMENTATION RATE: Sed Rate: 17 mm/hr (ref 0–22)

## 2011-02-03 LAB — BASIC METABOLIC PANEL
BUN: 6 mg/dL (ref 6–23)
CO2: 28 mEq/L (ref 19–32)
Calcium: 8.7 mg/dL (ref 8.4–10.5)
Chloride: 102 mEq/L (ref 96–112)
Creatinine, Ser: 0.5 mg/dL (ref 0.4–1.2)
GFR: 126.06 mL/min (ref >60.00)
Glucose, Bld: 74 mg/dL (ref 70–99)
Potassium: 4.2 mEq/L (ref 3.5–5.1)
Sodium: 140 mEq/L (ref 135–145)

## 2011-02-03 LAB — TSH: TSH: 0.85 u[IU]/mL (ref 0.35–5.50)

## 2011-02-03 LAB — B12 AND FOLATE PANEL
Folate: 7 ng/mL (ref >5.9)
Vitamin B-12: 213 pg/mL (ref 211–911)

## 2011-02-11 ENCOUNTER — Ambulatory Visit (INDEPENDENT_AMBULATORY_CARE_PROVIDER_SITE_OTHER): Payer: 59 | Admitting: Gastroenterology

## 2011-02-11 ENCOUNTER — Encounter: Payer: Self-pay | Admitting: Gastroenterology

## 2011-02-11 DIAGNOSIS — K589 Irritable bowel syndrome without diarrhea: Secondary | ICD-10-CM

## 2011-02-11 DIAGNOSIS — Z800 Family history of malignant neoplasm of digestive organs: Secondary | ICD-10-CM

## 2011-02-12 ENCOUNTER — Encounter: Payer: Self-pay | Admitting: Pulmonary Disease

## 2011-02-12 ENCOUNTER — Ambulatory Visit (INDEPENDENT_AMBULATORY_CARE_PROVIDER_SITE_OTHER): Payer: 59

## 2011-02-12 DIAGNOSIS — J301 Allergic rhinitis due to pollen: Secondary | ICD-10-CM

## 2011-02-12 DIAGNOSIS — E538 Deficiency of other specified B group vitamins: Secondary | ICD-10-CM

## 2011-02-18 NOTE — Assessment & Plan Note (Signed)
Summary: Denise King   Primary Care Provider:  Alroy Dust, MD  CC:  Yearly ROV & review of mult medical problems....  History of Present Illness: 60 y/o WF here for a follow up visit... she has multiple medical problems as noted below...    ~  August 06, 2009:  had back surg w/ fusion by DrCohen at [facility] hosp in Oct09... much difficulty after surg- out of work til 4/10 (ret part time) and 8/10 (just ret full time)... she is seeing DrPhillips for pain management now on EMBEDA 50-2 Bid + MSIR for breakthrough pain...   ~  February 08, 2010:  recent GI bug & [name] flair w/ left flank pain- stool studies neg & Rx w/ Levsin, empiric Flagyl, Florastor, & Lomotil...  she continues regular f/u in pain clinic w/ DrPhillips- now on OPANA & Cymbalta... f/u labs today.   ~  February 03, 2011:  1 yr ROV & she reports lots of GI problems- managed by DrKaplan; and chronic back pain- managed by DrPhillips on OPANA ER 20mg Bid & PERCOCET10- 4 per day...     She has GERD, [name], Divertics, & pos FamHx colon ca in brother;  chr abd pain, bloating, etc w/ mult tests> CT scan, Colonoscopy, Labs, medication trials, etc... on going Rx per DrKaplan.    She continues to smoke 1ppd- CXR w/ chr changes, NAD;  denies much cough, sputum, SOB but sedentary due to her back; offered smoking cessation help but she declines...    Osteoporosis eval & Rx by her GYN, DrHolland- treated w/ Reclast by her hx (we don't have records).    She has known B12 defic & she was on monthly B12 shots but for some reason she stopped> f/u B12 level is low at 213 today & she will re-start B12 shots monthly...   Current Problem List:  VERTIGO (ICD-780.4) - Hosp 8/08 w/ neg eval and eventually referred to Neurology w/ nothing found; Rx'd w/ Antivert, Phenergan, vestib therapy...  BRONCHITIS, RECURRENT (ICD-491.9) - 1ppd smoker w/ hx recurrent bronchitis... se reuses soking cessation counselling, Chantix, etc... notes min cough, sputum & SOB- sedentary due  to back prob...  VENOUS INSUFFICIENCY (ICD-459.81) - she knows to avoid sodium, elevate legs, wear support hose... has LASIX 20mg  Prn w/ KCl .  REFLUX ESOPHAGITIS (ICD-530.11) - EGD 9/05 by DrPerry showed mod severe reflux esoph w/ erosions,  Rx PROTONIX 40mg /d regularly w/ good control, but states she needs it Bid...  DIVERTICULOSIS OF COLON (ICD-562.10), &  [name] (ICD-564.1) - she takes LEVBID Bid as needed vs LIRAX id Prn...  ~  extensive GI eval by DrKaplan 1/10 for abd pain, [name], etc... neg EGD, neg CTAbd, neg Emptying scan... believed secondary to narcotic analgesics...  ~  on-going GI eval by DrKaplan w/ repeat CT Abd 2/11 (stable benign cysts liver/ pancreas/ renal), Colonoscopy 1/12 (sigm divertics & neg biopsies) etc...  COLONIC POLYPS (ICD-211.3) - prev colonoscopy 2/06 by [name] showed Divertic, otherw neg; f/u 84yrs w/ +fam hx colon ca.  ~  as above> f/u colon 1/12 w/ divertics otherw neg.  CALCULUS, KIDNEY (ICD-592.0) - Hx of Kidney Stones & small renal cysts on CT scan & sonar - followed by DrWrenn.  BACK PAIN, LUMBAR (ICD-724.2) - initial eval and rx by [facility] w/ DDD and no surg possible... she's had series of epidural shots from DrStrong without relief... additional opinion from [facility]- surg not recommended... referred to a chronic pain clinic...  ~  fall w/ injury 2009 & further  eval from DrCohen- she decided to proceed w/ spinal surgery & fusion by DrCohen 10/09 at [facility]... much difficulty post op & now followed in the pain management clinic by DrPhillips.  ~  she reports another opinion from DrBranch at [facility]- "he is considering additional surg"...  CHRONIC PAIN SYNDROME (ICD-338.4) - followed by drPhillips pain clinic... currently on OPANA ER 20mg  Bid & PERCOCET10- up to 4 daily... also on CYMBALTA 60mg /d...  OSTEOPENIA (ICD-733.90) - she was prev on EVISTA, Ca++, Vits (w/ BMD per GYN)... she stopped the Evista due to nausea which she feels is better off this  med... f/u by Drolland & pt rep[orts osteoporosis w/ RECLAST infusion...  ~  labs 3/09 showed Vit d level = 12... started on Vit D 50000 u weekly.  ~  labs 6/10 showed Vit D level = 49... OK to switch to 1000 u OTC daily.  ~  f/u GYN eval and BMD at [facility] office> she reports Rx w/ RECLAST for osteoposis (we don't have records).  HEADACHE (ICD-784.0)  ANXIETY (ICD-300.00) - prev on Lexapro & Xanax, but these were stopped in favor of CYMBALTA 60mg /d, and LUNESTA 2mg  Qhs...  VITAMIN B12 DEFICIENCY (ICD-266.2) -   ~  labs 1010 showed Vit B12 level = 118 & started on B 12 shots monthly...   ~  labs 2/11 showed Vit B12 level = >1500... for some reason she stopped the B12 shots in 2011.  ~  labs 2/12 showed Vit B12 level = 213... rec to resume B12 shots monthly.  HEALTH MAINTENANCE:  GYN= DrHolland & PAP, Mammogram, etc... all up-to-date; she is due a BMD in 1/10; and colon 10/11...   Current Medications (verified): 1)  Lasix 20 Mg  Tabs (Furosemide) .Marland Kitchen.. 1 By Mouth Once Daily As Needed Leg Swelling 2)  K-Tabs 10 Meq  Tbcr (Potassium Chloride) .Marland Kitchen.. 1 By Mouth Once Daily  With Lasix As Needed For Leg Swelling. 3)  Protonix 40 Mg Tbec (Pantoprazole Sodium) .... Take 1 Tablet By Mouth Once A Day 4)  Promethazine Hcl 25 Mg  Tabs (Promethazine Hcl) .Marland Kitchen.. 1 Tab By Mouth Q6h As Needed For Nausea.Marland KitchenMarland Kitchen 5)  Cymbalta 60 Mg Cpep (Duloxetine Hcl) .... Take 1 Tab By Mouth Once Daily.Marland KitchenMarland Kitchen 6)  Multivitamins   Tabs (Multiple Vitamin) .Marland Kitchen.. 1 Tab Daily.Marland KitchenMarland Kitchen 7)  Vitamin D 1000 Unit Tabs (Cholecalciferol) .... Take 1 Tab By Mouth Once Daily.Marland KitchenMarland Kitchen 8)  Percocet 7.5-325 Mg Tabs (Oxycodone-Acetaminophen) .... One Tablet By Mouth Four Times Daily 9)  Hyoscyamine Sulfate Cr 0.375 Mg Xr12h-Tab (Hyoscyamine Sulfate) .... One Tablet By Mouth Two Times A Day As Needed For Cramps 10)  Unisom 25 Mg Tabs (Doxylamine Succinate (Sleep)) .Marland Kitchen.. 1 At Bedtime As Needed 11)  Alprazolam 0.5 Mg Tabs (Alprazolam) .... Take One To 2 Tabs Q.6 H.  P.r.n. 12)  Reclast 5 Mg/159ml Soln (Zoledronic Acid) .... Once A Year For Osteoporosis 13)  Librax 2.5-5 Mg Caps (Clidinium-Chlordiazepoxide) .... Take 1 Tab 3 Times Daily As Needed 14)  Align  Caps (Probiotic Product) .... Take 1 Capsule Daily For 28 Days  Allergies (verified): 1)  ! Sulfa 2)  ! * Tequin 3)  ! Lipitor (Atorvastatin Calcium)  Past History:  Past Medical History: BRONCHITIS, RECURRENT (ICD-491.9) CIGARETTE SMOKER (ICD-305.1) VENOUS INSUFFICIENCY (ICD-459.81) GERD (ICD-530.81) REFLUX ESOPHAGITIS (ICD-530.11) DIVERTICULOSIS OF COLON (ICD-562.10) IRRITABLE BOWEL SYNDROME (ICD-564.1) COLONIC POLYPS (ICD-211.3) CALCULUS, KIDNEY (ICD-592.0) BACK PAIN (ICD-724.5) CHRONIC PAIN SYNDROME (ICD-338.4) OSTEOPENIA (ICD-733.90) HEADACHE (ICD-784.0) VERTIGO (ICD-780.4) DYSTHYMIA (ICD-300.4) DEPRESSION (ICD-311) VITAMIN B12  DEFICIENCY (ICD-266.2)  Past Surgical History: Appendectomy Tubal ligation - DrHolland Back Surgery- 10/09 by DrCohen Tonsillectomy  Family History: Reviewed history from 01/27/2010 and no changes required. Father:  died age 56 w/ colon ca & lung mets; hx prostate ca aswell. Mother:  [name], age 63 w/ hx heart dz & prev MVR; also has DJD. 3 Sibs:  2Bro & 1Sis -  1Bro w/ colon ca recently dx'd & awaiting surgin [place];  sis w/ hx skin squam cell ca's. Family History of Colon Polyps: Brother  Social History: Reviewed history from 01/27/2010 and no changes required. 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 +Smoker - starting in 20's til present, up to 1&1/2 ppd for a 30+ pack year hx. no drugs. Alcohol Use - no Illicit Drug Use - no  Review of Systems      See HPI  Vital Signs:  Patient profile:   60 year old female Height:      64 inches Weight:      130.50 pounds BMI:     22.48 O2 Sat:      99 % on Room air Temp:     97.2 degrees F  oral Pulse rate:   89 / minute BP sitting:   122 / 76  (left arm) Cuff size:   regular  Vitals Entered By: Carver Fila (February 03, 2011 9:39 AM)  O2 Flow:  Room air  CC: Yearly ROV & review of mult medical problems... Comments meds and allergies updated Phone number updated Carver Fila  February 03, 2011 9:39 AM    Physical Exam  Additional Exam:  WD, WN, 60 y/o WF in NAD... GENERAL:  Alert & oriented; pleasant & cooperative... HEENT:  [place]/AT,  EACs-clear, TMs-wnl, NOSE-clear, THROAT-clear & wnl. NECK:  Supple w/ fairROM; no JVD; normal carotid impulses w/o bruits; no thyromegaly or nodules palpated; no lymphadenopathy. CHEST:  Clear to P & A; without wheezes/ rales/ or rhonchi heard... HEART:  Regular Rhythm; without murmurs/ rubs/ or gallops detected... ABDOMEN:  Soft & nontender; normal bowel sounds; no organomegaly or masses palpated... EXT: without deformities, mild arthritic changes; no varicose veins/ +venous insuffic/ no edema.,  limp w/ walking.  BACK:  c/o mod severe back pain... NEURO:  no focal neuro deficits... DERM:  no lesions seen...    Impression & Recommendations:  Problem # 1:  BRONCHITIS, RECURRENT (ICD-491.9) She needs to quit smoking!!! but refuses smoking cessation help... Orders: T-2 View CXR (71020TC) TLB-BMP (Basic Metabolic Panel-BMET) (80048-METABOL) TLB-Hepatic/Liver Function Pnl (80076-HEPATIC) TLB-CBC Platelet - w/Differential (85025-CBCD) TLB-TSH (Thyroid Stimulating Hormone) (84443-TSH) TLB-B12 + Folate Pnl (04540_98119-J47/WGN) TLB-IBC Pnl (Iron/FE;Transferrin) (83550-IBC) TLB-Sedimentation Rate (ESR) (85652-ESR)  Problem # 2:  GI >>>  Mult issues all evaluated & followed by DrKaplan> revioewed w/ pt... continue his meds.  Problem # 3:  BACK PAIN (ICD-724.5) Severe back pain issues & chr pain syndrome treated by DrPhillips pain clinic... Her updated medication list for this problem includes:    Opana Er 20 Mg Xr12h-tab (Oxymorphone  hcl) .Marland Kitchen... Take 1 tab by mouth two times a day as directed by drphillips    Percocet 10-325 Mg Tabs (Oxycodone-acetaminophen) .Marland Kitchen... Take 1 tab by mouth up to 4 times daily as directed by drphillips  Problem # 4:  OSTEOPENIA (ICD-733.90) Evasl & Rx by [facility], GYN... Her updated medication list for this problem includes:  Reclast 5 Mg/130ml Soln (Zoledronic acid) ..... Once a year infusion for osteoporosis as directed by drholland  Problem # 5:  VITAMIN B12 DEFICIENCY (ICD-266.2) B12 level is 213 & she's been off the B12 shots... rec to start back on B12 shots monthly...  Problem # 6:  OTHER MEEICAL PROBLEMS AS NOTED>>>  Complete Medication List: 1)  Lasix 20 Mg Tabs (Furosemide) .Marland Kitchen.. 1 by mouth once daily as needed leg swelling 2)  K-tabs 10 Meq Tbcr (Potassium chloride) .Marland Kitchen.. 1 by mouth once daily  with lasix as needed for leg swelling. 3)  Protonix 40 Mg Tbec (Pantoprazole sodium) .... Take 1 tablet by mouth once a day 4)  Hyoscyamine Sulfate Cr 0.375 Mg Xr12h-tab (Hyoscyamine sulfate) .... One tablet by mouth two times a day as needed for cramps 5)  Librax 2.5-5 Mg Caps (Clidinium-chlordiazepoxide) .... Take 1 tab 3 times daily as needed 6)  Align Caps (Probiotic product) .... Take 1 capsule daily for 28 days 7)  Promethazine Hcl 25 Mg Tabs (Promethazine hcl) .Marland Kitchen.. 1 tab by mouth q6h as needed for nausea.Marland KitchenMarland Kitchen 8)  Opana Er 20 Mg Xr12h-tab (Oxymorphone hcl) .... Take 1 tab by mouth two times a day as directed by drphillips 9)  Percocet 10-325 Mg Tabs (Oxycodone-acetaminophen) .... Take 1 tab by mouth up to 4 times daily as directed by drphillips 10)  Cymbalta 60 Mg Cpep (Duloxetine hcl) .... Take 1 tab by mouth once daily... 11)  Alprazolam 0.5 Mg Tabs (Alprazolam) .... Take 1/2 to 1 tab by mouth three times a day as needed for anxiety... 12)  Unisom 25 Mg Tabs (Doxylamine succinate (sleep)) .Marland Kitchen.. 1 at bedtime as needed 13)  Multivitamins Tabs (Multiple vitamin) .Marland Kitchen.. 1 tab daily... 14)   Vitamin D 1000 Unit Tabs (Cholecalciferol) .... Take 1 tab by mouth once daily... 15)  Reclast 5 Mg/175ml Soln (Zoledronic acid) .... Once a year infusion for osteoporosis as directed by drholland  Patient Instructions: 1)  Today we updated your med list- see below.... 2)  We refilled the meds you requested for 2012... 3)  today we did your folow up CXR & blood work... please call the "phone tree" in a few days for your lab results.Marland KitchenMarland Kitchen 4)  Please cut back on the  smoking & work on smoking cessation (try the chantix).Marland KitchenMarland Kitchen 5)  Good luck w/ the pain management program for your back pain issues.Marland KitchenMarland Kitchen 6)  Try to stay as active as possible... 7)  Call for any questions... Prescriptions: ALPRAZOLAM 0.5 MG TABS (ALPRAZOLAM) take 1/2 to 1 tab by mouth three times a day as needed for anxiety...  #90 x 12   Entered and Authorized by:   Michele Mcalpine MD   Signed by:   Michele Mcalpine MD on 02/03/2011   Method used:   Print then Give to Patient   RxID:   [id number] CYMBALTA 60 MG CPEP (DULOXETINE HCL) take 1 tab by mouth once daily...  #30 x 12   Entered and Authorized by:   Michele Mcalpine MD   Signed by:   Michele Mcalpine MD on 02/03/2011   Method used:   Print then Give to Patient   RxID:   [id number] PROTONIX 40 MG TBEC (PANTOPRAZOLE SODIUM) Take 1 tablet by mouth once a day  #30 x 12   Entered and Authorized by:   Michele Mcalpine MD   Signed by:   Michele Mcalpine MD on 02/03/2011   Method used:   Print then  Give to Patient   RxID:   [id number]    Immunization History:  Influenza Immunization History:    Influenza:  historical (09/27/2010)

## 2011-02-18 NOTE — Assessment & Plan Note (Signed)
Summary: follow up after colonoscopy    History of Present Illness Visit Type: Follow-up Visit Primary GI MD: Melvia Heaps MD [facility] Primary Provider: Alroy Dust, MD Requesting Provider: n/a Chief Complaint: Patient here following up after her colonoscopy and she states that the past few weeks she has had constipation which is not normal for her. She complains of nausea and vomiting. History of Present Illness:   Ms. Cullinane has returned following colonoscopy.  This exam was entirely normal.  Biopsies were negative for microscopic colitis.  Since her colonoscopy she actually has been experiencing mild constipation.  There has been no change in her medicines or diet.  Family history is notable for a brother who recently died from colon cancer.   GI Review of Systems    Reports nausea and  vomiting.      Denies abdominal pain, acid reflux, belching, bloating, chest pain, dysphagia with liquids, dysphagia with solids, heartburn, loss of appetite, vomiting blood, weight loss, and  weight gain.      Reports constipation.     Denies anal fissure, black tarry stools, change in bowel habit, diarrhea, diverticulosis, fecal incontinence, heme positive stool, hemorrhoids, irritable bowel syndrome, jaundice, light color stool, liver problems, rectal bleeding, and  rectal pain.    Current Medications (verified): 1)  Lasix 20 Mg  Tabs (Furosemide) .Marland Kitchen.. 1 By Mouth Once Daily As Needed Leg Swelling 2)  K-Tabs 10 Meq  Tbcr (Potassium Chloride) .Marland Kitchen.. 1 By Mouth Once Daily  With Lasix As Needed For Leg Swelling. 3)  Protonix 40 Mg Tbec (Pantoprazole Sodium) .... Take 1 Tablet By Mouth Once A Day 4)  Hyoscyamine Sulfate Cr 0.375 Mg Xr12h-Tab (Hyoscyamine Sulfate) .... One Tablet By Mouth Two Times A Day As Needed For Cramps 5)  Librax 2.5-5 Mg Caps (Clidinium-Chlordiazepoxide) .... Take 1 Tab 3 Times Daily As Needed 6)  Align  Caps (Probiotic Product) .... Take 1 Capsule Daily For 28 Days 7)  Promethazine  Hcl 25 Mg  Tabs (Promethazine Hcl) .Marland Kitchen.. 1 Tab By Mouth Q6h As Needed For Nausea.Marland KitchenMarland Kitchen 8)  Opana Er 20 Mg Xr12h-Tab (Oxymorphone Hcl) .... Take 1 Tab By Mouth Two Times A Day As Directed By Drphillips 9)  Percocet 10-325 Mg Tabs (Oxycodone-Acetaminophen) .... Take 1 Tab By Mouth Up To 4 Times Daily As Directed By Drphillips 10)  Cymbalta 60 Mg Cpep (Duloxetine Hcl) .... Take 1 Tab By Mouth Once Daily... 11)  Alprazolam 0.5 Mg Tabs (Alprazolam) .... Take 1/2 To 1 Tab By Mouth Three Times A Day As Needed For Anxiety... 12)  Unisom 25 Mg Tabs (Doxylamine Succinate (Sleep)) .Marland Kitchen.. 1 At Bedtime As Needed 13)  Multivitamins   Tabs (Multiple Vitamin) .Marland Kitchen.. 1 Tab Daily... 14)  Vitamin D 1000 Unit Tabs (Cholecalciferol) .... Take 1 Tab By Mouth Once Daily... 15)  Reclast 5 Mg/161ml Soln (Zoledronic Acid) .... Once A Year Infusion For Osteoporosis As Directed By Drholland  Allergies (verified): 1)  ! Sulfa 2)  ! * Tequin 3)  ! Lipitor (Atorvastatin Calcium)  Past History:  Past Medical History: Reviewed history from 02/03/2011 and no changes required. BRONCHITIS, RECURRENT (ICD-491.9) CIGARETTE SMOKER (ICD-305.1) VENOUS INSUFFICIENCY (ICD-459.81) GERD (ICD-530.81) REFLUX ESOPHAGITIS (ICD-530.11) DIVERTICULOSIS OF COLON (ICD-562.10) IRRITABLE BOWEL SYNDROME (ICD-564.1) COLONIC POLYPS (ICD-211.3) CALCULUS, KIDNEY (ICD-592.0) BACK PAIN (ICD-724.5) CHRONIC PAIN SYNDROME (ICD-338.4) OSTEOPENIA (ICD-733.90) HEADACHE (ICD-784.0) VERTIGO (ICD-780.4) DYSTHYMIA (ICD-300.4) DEPRESSION (ICD-311) VITAMIN B12 DEFICIENCY (ICD-266.2)  Past Surgical History: Reviewed history from 02/03/2011 and no changes required. Appendectomy Tubal ligation - DrHolland Back  Surgery- 10/09 by DrCohen Tonsillectomy  Family History: Reviewed history from 01/27/2010 and no changes required. Father:  died age 66 w/ colon ca & lung mets; hx prostate ca aswell. Mother:  [name], age 89 w/ hx heart dz & prev MVR; also has  DJD. 3 Sibs:  2Bro & 1Sis -  1Bro w/ colon ca recently dx'd & awaiting surgin [place];  sis w/ hx skin squam cell ca's. Family History of Colon Polyps: Brother deceased 05/20/2010  Social History: Reviewed history from 01/27/2010 and no changes required. 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 +Smoker - starting in May 21, 2023 til present, up to 1&1/2 ppd for a 30+ pack year hx. no drugs. Alcohol Use - no Illicit Drug Use - no  Review of Systems  The patient denies allergy/sinus, anemia, anxiety-new, arthritis/joint pain, back pain, blood in urine, breast changes/lumps, change in vision, confusion, cough, coughing up blood, depression-new, fainting, fatigue, fever, headaches-new, hearing problems, heart murmur, heart rhythm changes, itching, menstrual pain, muscle pains/cramps, night sweats, nosebleeds, pregnancy symptoms, shortness of breath, skin rash, sleeping problems, sore throat, swelling of feet/legs, swollen lymph glands, thirst - excessive , urination - excessive , urination changes/pain, urine leakage, vision changes, and voice change.    Vital Signs:  Patient profile:   60 year old female Height:      64 inches Weight:      129.6 pounds BMI:     22.33 Pulse rate:   86 / minute Pulse rhythm:   regular BP sitting:   140 / 82  (left arm) Cuff size:   regular  Vitals Entered By: Harlow Mares CMA Duncan Dull) (February 11, 2011 9:39 AM)   Impression & Recommendations:  Problem # 1:  [name] (ICD-564.1) Tthe patient has [name] which is  diarrhea predominant.  Interestingly, she currently is in a quiescent faze.  Recommendations #1 continue fiber supplementation and hyomax for abdominal pain  Problem # 2:  FM HX MALIGNANT NEOPLASM GASTROINTESTINAL TRACT (ICD-V16.0) colonoscopy every 5 years  Patient Instructions: 1)  Copy sent to : Alroy Dust, MD 2)  Call back as needed  3)  The medication  list was reviewed and reconciled.  All changed / newly prescribed medications were explained.  A complete medication list was provided to the patient / caregiver.

## 2011-02-18 NOTE — Assessment & Plan Note (Signed)
Summary: B-12   Nurse Visit   Allergies: 1)  ! Sulfa 2)  ! * Tequin 3)  ! Lipitor (Atorvastatin Calcium)  Medication Administration  Injection # 1:    Medication: Vit B12 1000 mcg    Diagnosis: VITAMIN B12 DEFICIENCY (ICD-266.2)    Route: SQ    Site: R deltoid    Exp Date: 06/2012    Lot #: [id number]    Mfr: [employer]    Comments: 1000 MCG     Given by: TAMMY Donyetta Ogletree IN ALLERGY LAB  Orders Added: 1)  Vit B12 1000 mcg [J3420] 2)  Admin of Injection (IM/SQ) [45409]   Medication Administration  Injection # 1:    Medication: Vit B12 1000 mcg    Diagnosis: VITAMIN B12 DEFICIENCY (ICD-266.2)    Route: SQ    Site: R deltoid    Exp Date: 06/2012    Lot #: [id number]    Mfr: [employer]    Comments: 1000 MCG     Given by: TAMMY Shasha Buchbinder IN ALLERGY LAB  Orders Added: 1)  Vit B12 1000 mcg [J3420] 2)  Admin of Injection (IM/SQ) [82956]

## 2011-03-03 IMAGING — CT CT L SPINE W/O CM
3 of 11 series · 12 of 33 positions shown, 14 images · non-contrast
Comparison: 03/06/2009.

CLINICAL DATA: Lumbar pain.  Evaluate fusion.

CT LUMBAR SPINE WITHOUT CONTRAST
TECHNIQUE: Multidetector CT imaging of the lumbar spine was
performed without intravenous contrast administration. Multiplanar
CT image reconstructions were also generated.

[Series 4: thin recons · axial · 0.27mm/px · z∈[-29,+91]mm · 4 of 641 slices shown, 5 images]
[im 129/641  soft-tissue]
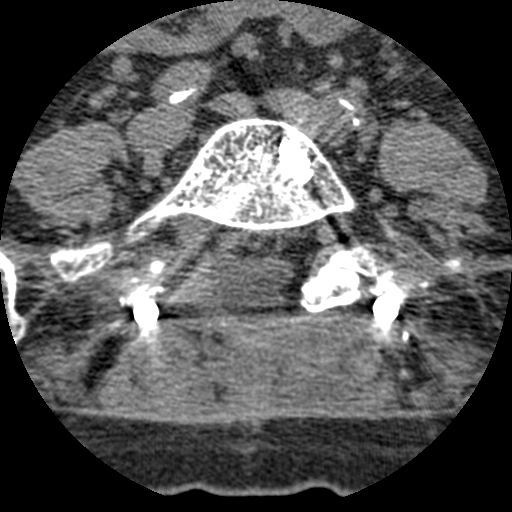
[im 129/641  bone]
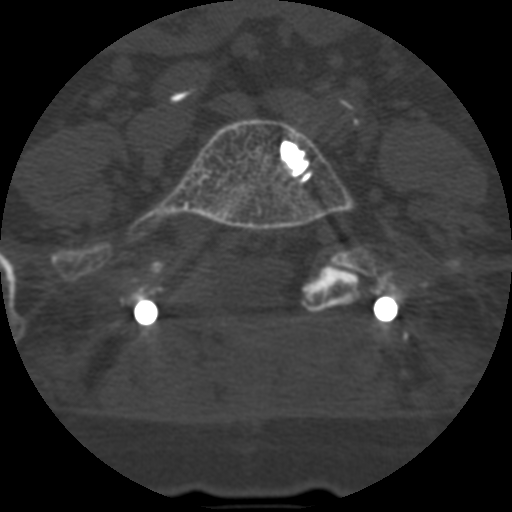
[im 257/641  bone]
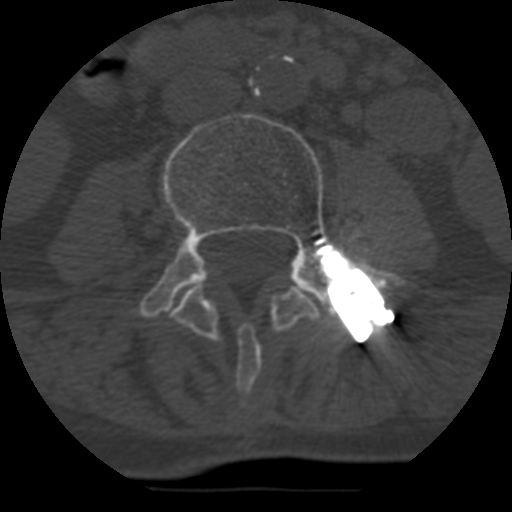
[im 385/641  bone]
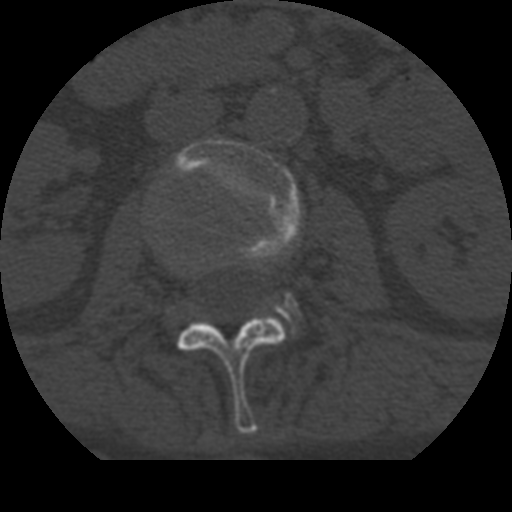
[im 513/641  bone]
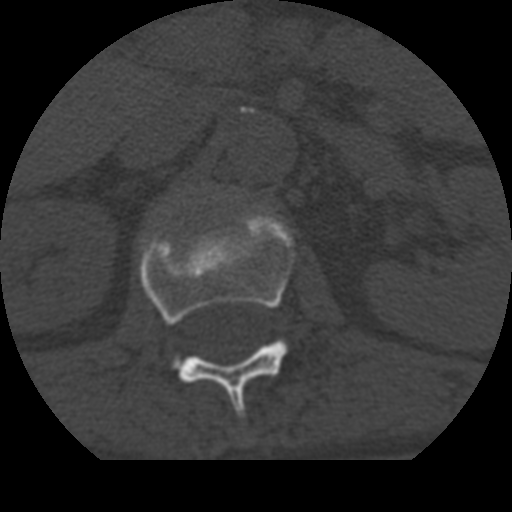

[Series 400: sag · sagittal · 0.40mm/px · 5 of 40 slices shown, 6 images]
[im 14/40  bone]
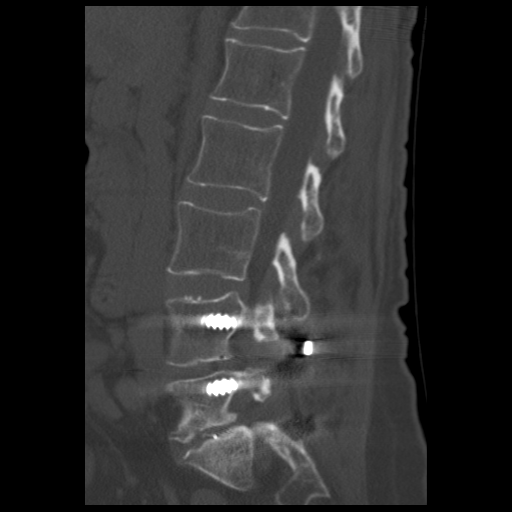
[im 17/40  bone]
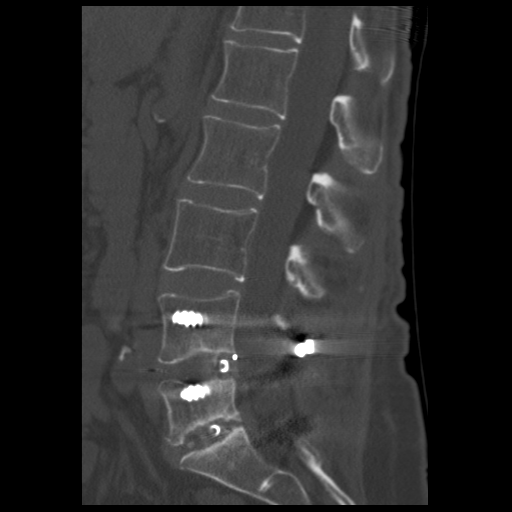
[im 20/40  soft-tissue]
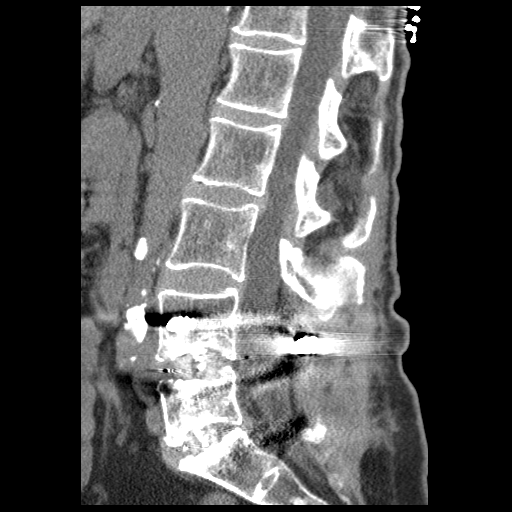
[im 20/40  bone]
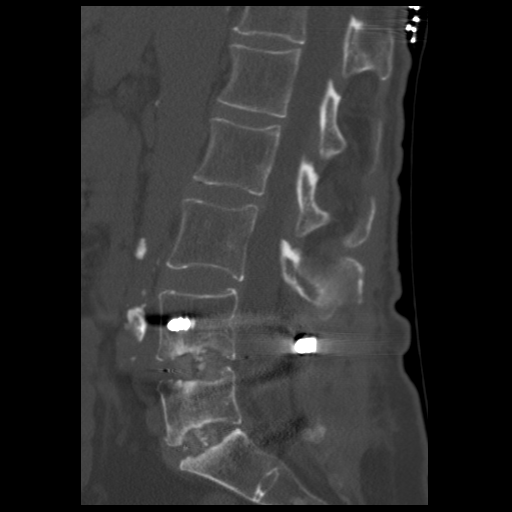
[im 23/40  bone]
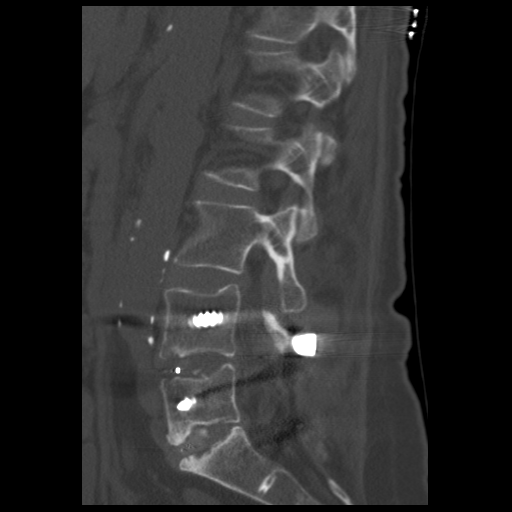
[im 27/40  bone]
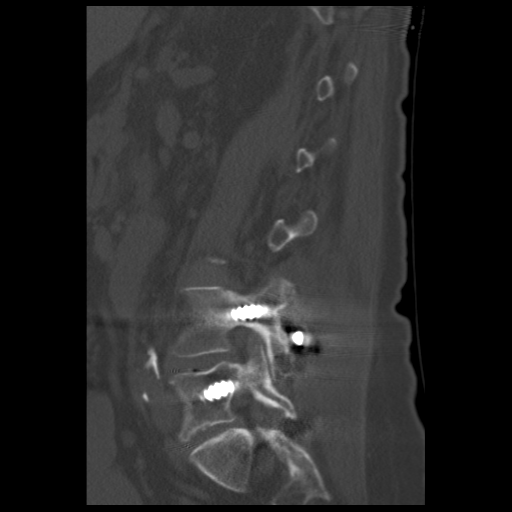

[Series 401: cor · coronal · 0.40mm/px · 3 of 40 slices shown]
[im 8/40  bone]
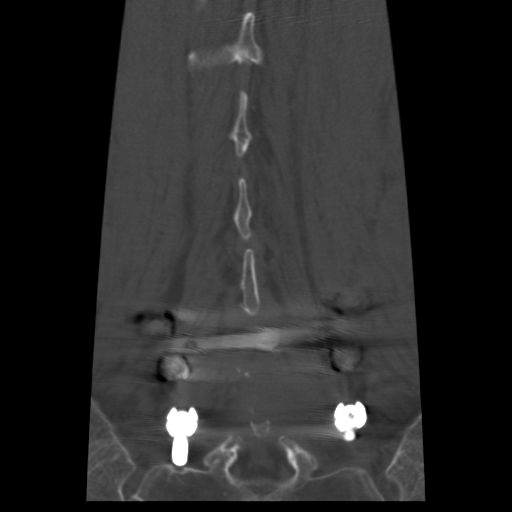
[im 16/40  bone]
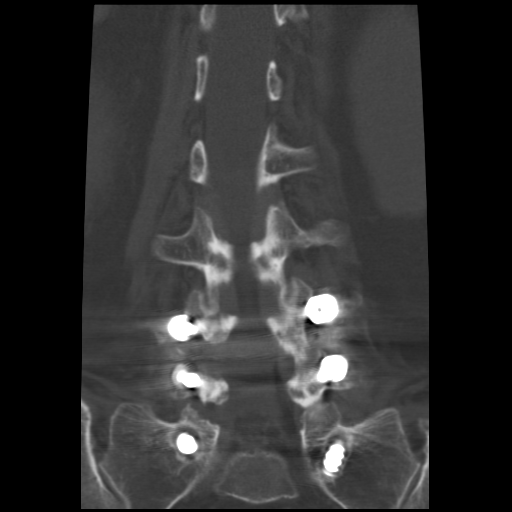
[im 24/40  bone]
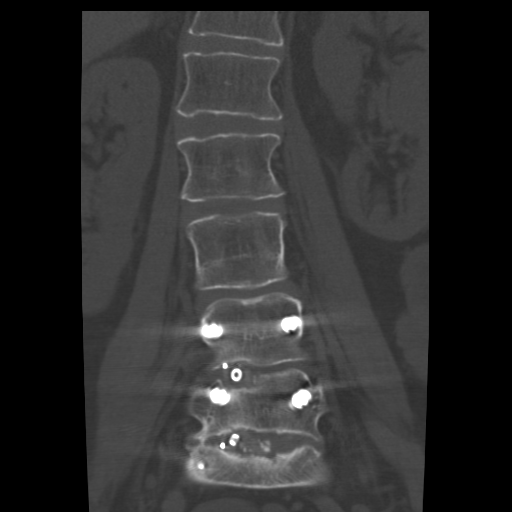

[12 of 33 positions shown; findings below may reference images not displayed]

FINDINGS: The patient is status post L4-S1 PLIF.  Levoconvex
curvature at L4-5 is stable with compensatory rightward curvature
centered at L2.  Mild atherosclerotic calcifications are noted
without aneurysm.  There is persistent lucency surrounding the S1
screws bilaterally.  Bridging bone at the L4-5 level is more
convincing on today's exam.  There is no gas in the disc space.  No
definite bridging bone is established at the L5-S1 level.  This is
not significantly changed since the prior exam.

There is no significant stenosis at the L2-3 level or above.

Leftward disc bulging at L3-4 is stable since the prior exam.
There is no significant stenosis.

The patient is status post wide laminectomy without osseous
stenosis at L4-5 or L5-S1.
IMPRESSION: 1.  Progression of osseous fusion at the L4-5 level.
2.  Lucency surrounding the screws at S1 suggests loosening.
3.  No definite fusion or interval change across the L5-S1 disc
level.
4.  Stable mild scoliosis.

## 2011-03-13 ENCOUNTER — Encounter: Payer: Self-pay | Admitting: Pulmonary Disease

## 2011-03-13 ENCOUNTER — Ambulatory Visit (INDEPENDENT_AMBULATORY_CARE_PROVIDER_SITE_OTHER): Payer: 59

## 2011-03-13 DIAGNOSIS — E538 Deficiency of other specified B group vitamins: Secondary | ICD-10-CM

## 2011-03-16 ENCOUNTER — Telehealth (INDEPENDENT_AMBULATORY_CARE_PROVIDER_SITE_OTHER): Payer: Self-pay | Admitting: *Deleted

## 2011-03-16 LAB — URINALYSIS, ROUTINE W REFLEX MICROSCOPIC
Bilirubin Urine: NEGATIVE
Glucose, UA: NEGATIVE mg/dL
Hgb urine dipstick: NEGATIVE
Ketones, ur: NEGATIVE mg/dL
Nitrite: NEGATIVE
Protein, ur: NEGATIVE mg/dL
Specific Gravity, Urine: 1.005 (ref 1.005–1.030)
Urobilinogen, UA: 0.2 mg/dL (ref 0.0–1.0)
pH: 6 (ref 5.0–8.0)

## 2011-03-16 LAB — CBC
HCT: 47.9 % — ABNORMAL HIGH (ref 36.0–46.0)
Hemoglobin: 16.7 g/dL — ABNORMAL HIGH (ref 12.0–15.0)
MCHC: 34.9 g/dL (ref 30.0–36.0)
MCV: 101.2 fL — ABNORMAL HIGH (ref 78.0–100.0)
Platelets: 338 10*3/uL (ref 150–400)
RBC: 4.74 MIL/uL (ref 3.87–5.11)
RDW: 13.1 % (ref 11.5–15.5)
WBC: 11 10*3/uL — ABNORMAL HIGH (ref 4.0–10.5)

## 2011-03-16 LAB — DIFFERENTIAL
Basophils Absolute: 0.1 10*3/uL (ref 0.0–0.1)
Basophils Relative: 1 % (ref 0–1)
Eosinophils Absolute: 0.2 10*3/uL (ref 0.0–0.7)
Eosinophils Relative: 2 % (ref 0–5)
Lymphocytes Relative: 41 % (ref 12–46)
Lymphs Abs: 4.5 10*3/uL — ABNORMAL HIGH (ref 0.7–4.0)
Monocytes Absolute: 0.9 10*3/uL (ref 0.1–1.0)
Monocytes Relative: 8 % (ref 3–12)
Neutro Abs: 5.4 10*3/uL (ref 1.7–7.7)
Neutrophils Relative %: 49 % (ref 43–77)

## 2011-03-16 LAB — COMPREHENSIVE METABOLIC PANEL
ALT: 13 U/L (ref 0–35)
AST: 25 U/L (ref 0–37)
Albumin: 4.1 g/dL (ref 3.5–5.2)
Alkaline Phosphatase: 76 U/L (ref 39–117)
BUN: 5 mg/dL — ABNORMAL LOW (ref 6–23)
CO2: 26 mEq/L (ref 19–32)
Calcium: 9.3 mg/dL (ref 8.4–10.5)
Chloride: 103 mEq/L (ref 96–112)
Creatinine, Ser: 0.52 mg/dL (ref 0.4–1.2)
GFR calc Af Amer: >60 mL/min (ref >60)
GFR calc non Af Amer: >60 mL/min (ref >60)
Glucose, Bld: 73 mg/dL (ref 70–99)
Potassium: 3 mEq/L — ABNORMAL LOW (ref 3.5–5.1)
Sodium: 139 mEq/L (ref 135–145)
Total Bilirubin: 0.8 mg/dL (ref 0.3–1.2)
Total Protein: 8.2 g/dL (ref 6.0–8.3)

## 2011-03-16 LAB — LIPASE, BLOOD: Lipase: 49 U/L (ref 11–59)

## 2011-03-17 NOTE — Assessment & Plan Note (Signed)
Summary: B12 INJECTION//SH   Nurse Visit   Allergies: 1)  ! Sulfa 2)  ! * Tequin 3)  ! Lipitor (Atorvastatin Calcium)  Medication Administration  Injection # 1:    Medication: Vit B12 1000 mcg    Diagnosis: VITAMIN B12 DEFICIENCY (ICD-266.2)    Route: IM    Site: L deltoid    Exp Date: 06/2012    Lot #: [id number]    Mfr: [employer]    Comments:    Given by: Babette Relic Josia Cueva IN [facility]ERGY LAB   Orders Added: 1)  Vit B12 1000 mcg [J3420] 2)  Admin of Injection (IM/SQ) [[id number]]   Medication Administration  Injection # 1:    Medication: Vit B12 1000 mcg    Diagnosis: VITAMIN B12 DEFICIENCY (ICD-266.2)    Route: IM    Site: L deltoid    Exp Date: 06/2012    Lot #: [id number]    Mfr: [employer]    Comments:    Given by: Babette Relic Leida Luton IN ALLERGY LAB   Orders Added: 1)  Vit B12 1000 mcg [J3420] 2)  Admin of Injection (IM/SQ) [[id number]]

## 2011-03-20 ENCOUNTER — Telehealth: Payer: Self-pay | Admitting: Pulmonary Disease

## 2011-03-20 LAB — CBC
HCT: 44.6 % (ref 36.0–46.0)
Hemoglobin: 15.3 g/dL — ABNORMAL HIGH (ref 12.0–15.0)
MCHC: 34.2 g/dL (ref 30.0–36.0)
MCV: 101.4 fL — ABNORMAL HIGH (ref 78.0–100.0)
Platelets: 390 10*3/uL (ref 150–400)
RBC: 4.4 MIL/uL (ref 3.87–5.11)
RDW: 12.7 % (ref 11.5–15.5)
WBC: 11.5 10*3/uL — ABNORMAL HIGH (ref 4.0–10.5)

## 2011-03-20 LAB — DIFFERENTIAL
Basophils Absolute: 0 10*3/uL (ref 0.0–0.1)
Basophils Relative: 0 % (ref 0–1)
Eosinophils Absolute: 0.1 10*3/uL (ref 0.0–0.7)
Eosinophils Relative: 1 % (ref 0–5)
Lymphocytes Relative: 32 % (ref 12–46)
Lymphs Abs: 3.6 10*3/uL (ref 0.7–4.0)
Monocytes Absolute: 0.7 10*3/uL (ref 0.1–1.0)
Monocytes Relative: 6 % (ref 3–12)
Neutro Abs: 7 10*3/uL (ref 1.7–7.7)
Neutrophils Relative %: 61 % (ref 43–77)

## 2011-03-20 LAB — URINALYSIS, ROUTINE W REFLEX MICROSCOPIC
Bilirubin Urine: NEGATIVE
Glucose, UA: NEGATIVE mg/dL
Hgb urine dipstick: NEGATIVE
Ketones, ur: NEGATIVE mg/dL
Nitrite: NEGATIVE
Protein, ur: NEGATIVE mg/dL
Specific Gravity, Urine: 1.003 — ABNORMAL LOW (ref 1.005–1.030)
Urobilinogen, UA: 0.2 mg/dL (ref 0.0–1.0)
pH: 7 (ref 5.0–8.0)

## 2011-03-20 LAB — COMPREHENSIVE METABOLIC PANEL
ALT: 15 U/L (ref 0–35)
AST: 32 U/L (ref 0–37)
Albumin: 3.6 g/dL (ref 3.5–5.2)
Alkaline Phosphatase: 71 U/L (ref 39–117)
BUN: 3 mg/dL — ABNORMAL LOW (ref 6–23)
CO2: 30 mEq/L (ref 19–32)
Calcium: 9.2 mg/dL (ref 8.4–10.5)
Chloride: 102 mEq/L (ref 96–112)
Creatinine, Ser: 0.59 mg/dL (ref 0.4–1.2)
GFR calc Af Amer: >60 mL/min (ref >60)
GFR calc non Af Amer: >60 mL/min (ref >60)
Glucose, Bld: 98 mg/dL (ref 70–99)
Potassium: 3.9 mEq/L (ref 3.5–5.1)
Sodium: 142 mEq/L (ref 135–145)
Total Bilirubin: 0.3 mg/dL (ref 0.3–1.2)
Total Protein: 7.1 g/dL (ref 6.0–8.3)

## 2011-03-20 LAB — LIPASE, BLOOD: Lipase: 31 U/L (ref 11–59)

## 2011-03-20 NOTE — Telephone Encounter (Signed)
Pt is aware of recs per Tammy Parrett and will stop levaquin today. She will start mucinex as needed for cough and congestion. She will call if her symptoms do not improve or if they get any worse. Will go to ER if necessary.

## 2011-03-20 NOTE — Telephone Encounter (Signed)
Yes levaquin can increase risk of achilles tendon rupture ,  Some abx can cause joint aches Today would be 5 day of abx -would stop  Use mucinex  As needed  For cough/congestion  Please contact office for sooner follow up if symptoms do not improve or worsen or seek emergency care

## 2011-03-20 NOTE — Telephone Encounter (Signed)
Pt started Levaquin 750mg  on 03/16/2011 and she is having pain in both ankles. She denies any falls or injuries to either foot. Pls advise. ALLERGIES:  Sulfa, Tequin and Lipitor.

## 2011-03-26 NOTE — Progress Notes (Signed)
Summary: sick > levaquin rx, mucinex  Phone Note Call from Patient Call back at [facility] Phone [phone]   Caller: Patient Call For: nadel Reason for Call: Talk to Nurse Summary of Call: Patient c/o head congestion, cough, no fever x 7 days.  [facility] Initial call taken by: Lehman Prom,  March 16, 2011 9:59 AM  Follow-up for Phone Call        called and spoke with pt.  symptoms started 6 days ago.  pt c/o hoarseness, PND, stomach ache and diarrhea after eating, head congestion, "feels weak," and non productive cough and increased sob.  denies facial pressure and headache, fever, chills or sweats.  allergies: sulfa, tequin, lipitor. Aundra Millet Reynolds LPN  March 16, 2011 10:45 AM   Additional Follow-up for Phone Call Additional follow up Details #1::        per SN---recs are for pt to take the levaquin 750mg    1 daily #7 and mucinex 2 two times a day with plenty of fluids.   thanks Randell Loop CMA  March 16, 2011 2:48 PM   Called, spoke with pt.  She was informed of above recs per SN and verbalized understanding.  [facility] Will call back if sxs do not improve or worsen.  Additional Follow-up by: Gweneth Dimitri RN,  March 16, 2011 2:54 PM    New/Updated Medications: LEVAQUIN 750 MG TABS (LEVOFLOXACIN) Take 1 tablet by mouth once a day Prescriptions: LEVAQUIN 750 MG TABS (LEVOFLOXACIN) Take 1 tablet by mouth once a day  #7 x 0   Entered by:   Gweneth Dimitri RN   Authorized by:   Michele Mcalpine MD   Signed by:   Gweneth Dimitri RN on 03/16/2011   Method used:   [facility]        [facility] [facility]ret[facility])       [address]       [place], [place]  [address]       Ph: [phone] or [phone]       Fax: [phone]   RxID:   [id number]

## 2011-04-13 ENCOUNTER — Ambulatory Visit (INDEPENDENT_AMBULATORY_CARE_PROVIDER_SITE_OTHER): Payer: 59

## 2011-04-13 ENCOUNTER — Ambulatory Visit (INDEPENDENT_AMBULATORY_CARE_PROVIDER_SITE_OTHER): Payer: 59 | Admitting: Adult Health

## 2011-04-13 ENCOUNTER — Encounter: Payer: Self-pay | Admitting: Adult Health

## 2011-04-13 VITALS — BP 124/76 | HR 99 | Temp 97.8°F | Ht 64.0 in | Wt 127.2 lb

## 2011-04-13 DIAGNOSIS — D649 Anemia, unspecified: Secondary | ICD-10-CM

## 2011-04-13 DIAGNOSIS — J42 Unspecified chronic bronchitis: Secondary | ICD-10-CM

## 2011-04-13 MED ORDER — LEVALBUTEROL HCL 0.63 MG/3ML IN NEBU
0.6300 mg | INHALATION_SOLUTION | Freq: Once | RESPIRATORY_TRACT | Status: AC
  Administered 2011-04-13: 0.63 mg via RESPIRATORY_TRACT

## 2011-04-13 MED ORDER — CYANOCOBALAMIN 1000 MCG/ML IJ SOLN
1000.0000 ug | Freq: Once | INTRAMUSCULAR | Status: AC
  Administered 2011-04-13: 1000 ug via INTRAMUSCULAR

## 2011-04-13 MED ORDER — PREDNISONE 10 MG PO TABS: ORAL_TABLET | ORAL | Status: AC

## 2011-04-13 NOTE — Patient Instructions (Signed)
Finish Zpack Mucinex DM Twice daily  As needed  Cough/congestion Fluids and rest  Prednisone taper over next week.  No smoking.  Please contact office for sooner follow up if symptoms do not improve or worsen or seek emergency care   follow up Dr. Kriste Basque  As planned

## 2011-04-13 NOTE — Progress Notes (Signed)
Addended by: Boone Master on: 04/13/2011 12:32 PM   Modules accepted: Orders

## 2011-04-13 NOTE — Progress Notes (Signed)
Subjective:    Patient ID: Denise King, female    DOB: 03-Jun-1951, 60 y.o.   MRN: [id number]  HPI ~ August 06, 2009: had back surg w/ fusion by DrCohen at [facility] hosp in Oct09... much difficulty after surg- out of work til 4/10 (ret part time) and 8/10 (just ret full time)... she is seeing DrPhillips for pain management now on EMBEDA 50-2 Bid + MSIR for breakthrough pain...   ~ February 08, 2010: recent GI bug & [name] flair w/ left flank pain- stool studies neg & Rx w/ Levsin, empiric Flagyl, Florastor, & Lomotil... she continues regular f/u in pain clinic w/ DrPhillips- now on OPANA & Cymbalta... f/u labs today.   ~ February 03, 2011: 1 yr ROV & she reports lots of GI problems- managed by DrKaplan; and chronic back pain- managed by DrPhillips on OPANA ER 20mg Bid & PERCOCET10- 4 per day...  She has GERD, [name], Divertics, & pos FamHx colon ca in brother; chr abd pain, bloating, etc w/ mult tests> CT scan, Colonoscopy, Labs, medication trials, etc... on going Rx per DrKaplan.  She continues to smoke 1ppd- CXR w/ chr changes, NAD; denies much cough, sputum, SOB but sedentary due to her back; offered smoking cessation help but she declines...  Osteoporosis eval & Rx by her GYN, DrHolland- treated w/ Reclast by her hx (we don't have records).  She has known B12 defic & she was on monthly B12 shots but for some reason she stopped> f/u B12 level is low at 213 today & she will re-start B12 shots monthly...  04/13/2011 Acute OV -walk in  Presents today for acute ov , complains  Fever (subjective) , chest congestion, prod cough clear mucus, runny nose, wheezing,  increased SOB x6days. Seen at Urgent care on 4/13 -given zpack. reports no better with abx. She is on day 4 of 5 of zpack.  She is still smoking. Has not started Chantix starter pack yet. Taking hydrocodone cough syrup without much help.    Review of Systems Constitutional:   No  weight loss, night sweats,     HEENT:   No headaches,  Difficulty  swallowing,  Tooth/dental problems, or  Sore throat,                + sneezing, itching, ear ache, nasal congestion, post nasal drip,   CV:  No chest pain,  Orthopnea, PND, swelling in lower extremities, anasarca, dizziness, palpitations, syncope.   GI  No heartburn, indigestion, abdominal pain, nausea, vomiting, diarrhea, change in bowel habits, loss of appetite, bloody stools.   Resp  No coughing up of blood.  No change in color of mucus.  No wheezing.  No chest wall deformity  Skin: no rash or lesions.  GU: no dysuria, change in color of urine, no urgency or frequency.  No flank pain, no hematuria   MS:  No joint pain or swelling.    Psych:  No change in mood or affect. No depression or anxiety.  No memory loss.   Objective:   Physical Exam GEN: A/Ox3; pleasant , NAD, well nourished   HEENT:  [place]/AT,  EACs-clear, TMs-wnl, NOSE-clear drainage , THROAT-clear, no lesions, no postnasal drip or exudate noted.   NECK:  Supple w/ fair ROM; no JVD; normal carotid impulses w/o bruits; no thyromegaly or nodules palpated; no lymphadenopathy.  RESP  Coarse BS with few exp wheezes   CARD:  RRR, no m/r/g  , no peripheral edema, pulses intact, no cyanosis or clubbing.  GI:   Soft & nt; nml bowel sounds; no organomegaly or masses detected.  Musco: Warm bil, no deformities or joint swelling noted.   Neuro: alert, no focal deficits noted.    Skin: Warm, no lesions or rashes          Assessment & Plan:

## 2011-04-13 NOTE — Assessment & Plan Note (Addendum)
Flare-slow to resolve in active smoker.  Albuterol neb in office  Plan:  Finish Zpack Mucinex DM Twice daily  As needed  Cough/congestion Fluids and rest  Prednisone taper over next week.  No smoking.  Please contact office for sooner follow up if symptoms do not improve or worsen or seek emergency care   follow up Dr. Kriste Basque  As planned

## 2011-05-12 NOTE — Assessment & Plan Note (Signed)
HISTORY:  Denise King is a 60 year old female who I initially saw on  March 20, 2008.  She has a several-year history of intermittent back  pain becoming more constant starting approximately 1 year ago.  She has  had prior MRIs, demonstrating L5-S1 degenerative disk as well as annular  tear at L4-L5 level.  She has had no nerve root impingement and has had  no lower extremity radicular discomfort.  In the past, she has had L4-L5  epidural injections in December 2008 and L4-L5, L5-S1 intra-articular  injections plus medial branch blocks which actually more more helpful  than the epidural injections.  Because of that repeated lumbar medial  branch block, she did have immediate reduction of pain from 7 to 3, but  states that her pain relief was really probably less than an hour.  She  has had repeat MRI of the spine dated March 30, 2008 showing annular rent  on left L4-L5, no neural compression.  She saw Dr. Newell Coral from  Neurosurgery who did not recommend surgical  intervention.  We then  performed sacroiliac injection on the left on May 07, 2008, and her pain  was reduced from 6 to 4, i.e., less than 50%.   In the interval time, she has seen Dr. Noel Gerold from Orthopedic Spine  Surgery as well as undergone diskogram which revealed concordant pain  with reduced pressure at L5-S1 with normal findings at L3-4 and L4-5.   She continues to work 40 hours a week in [employer].  Her pain  does interfere with meal prep, household duties, shopping, as well as  gardening as a hobby.  Her relief from meds is fair.  She is walking x10-  15 minutes.  She has had some bladder frequency recently, but no fevers.   PHYSICAL EXAMINATION:  VITAL SIGNS:  Blood pressure 120/87, pulse 109,  respiratory rate 20, and O2 sats 99% on room air.  GENERAL:  Anxious-appearing female in no acute distress.  Orientation  x3.   Gait, she is stiff when she first stands up and then she walks upright.  She is able to  toe walk and heel walk.  Her lower extremity strength is  normal.  Deep tendon reflexes are normal.  Lower extremity range of  motion is normal.  She has some tenderness in the lumbosacral  paraspinal.  Pain is worse with extension than with forward flexion.   IMPRESSION:  Lumbar pain appears to be diskogenic primarily.  She does  have a couple of signs and symtoms that also suggest facet-mediated  pain.  This includes at least a temporary respond to facet blocks, this  is likely from lidocaine phase.  In addition, her pain is worse with  extension than with flexion.   From an injection standpoint, the only other thing to do would be to  repeat medial branch blocks and possible radiofrequency.  However, she  is going to get surgical reevaluation in regards to her diskogenic pain.  I have given her one more month supply of Vicodin 7.5/325 q.i.d., #120.  She could follow up with Dr. Charlett Blake or Dr. Noel Gerold for further  medications.  I will see her back on a p.r.n. basis should she not  undergo surgery.      Denise King, M.D.  Electronically Signed     AEK/MedQ  D:  06/21/2008 12:45:47  T:  06/22/2008 04:24:30  Job #:  [id number]   cc:   Sharolyn Douglas, M.D.  Fax: [phone]  [name], M.D.  Fax: [phone]

## 2011-05-12 NOTE — Group Therapy Note (Signed)
REQUESTING PHYSICIAN:  [name], M.D.   Consult requested for the evaluation of low back pain and posterior  right hip pain.   HISTORY:  Patient has a several-year history of intermittent back pain,  becoming more constant, starting about a year ago.  She has seen Dr.  Charlett Blake, diagnosed with lumbar degenerative disk.  She had mild facet  joint changes in the lower lumbar area on x-ray.  She had no evidence of  compression fracture.  She was treated with Vicodin, Voltaren.  MRI of  the spine performed on April 13, 2007 demonstrated L5-S1 degenerative  disk, annular tear in L4-5 level, left foraminal.  No impingement on the  L4 nerve root.   She is fitted with a lumbar corset, switched to Percocet.  She had some  relief with the corset initially but then more recently feels like it is  aggravating her pain.  She has been trialed on a prednisone dose pack as  well as Darvocet, which have not been particularly helpful.  She had an  L4-5 epidural steroid injection on December 20, 2007, then an L4-5, L5-  S1 intra-articular injection plus medial branch blocks, L4 and L5.  She  had a total of 120 mg of Depo-Medrol during that injection, and then she  had another injection with another 120 mg of Depo-Medrol at L4-5  interlaminar.  Her facet injections actually helped the most but only  for a couple of weeks.  The L4-5 translaminar injections really did not  help.  She felt very shaky following the steroid injections.   Her pain source is walking, bending, standing.  Improves with rest,  heat, medications.  She can walk 20-30 minutes at a time.  She climbs  steps.  She drives.  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  She has some difficulty with household duties  such as vacuuming and gardening.  She has some problems with diarrhea,  spasms, anxiety.  Diagnosed with [name].   Past history is also significant for left elbow tendinitis.   SOCIAL HISTORY:   Married.  Lives with her husband and daughter.  Smokes  and drinks 1-2 drinks of alcoholic beverages per day.   PHYSICAL EXAMINATION:  Blood pressure 122/67, pulse 78, respirations 18,  O2 sat 96% on room air.  GENERAL:  No acute distress.  Mood and affect appropriate.  Her lumbar spine range of motion is 50% forward flexion and only 25%  extension.  The extension is more painful than flexion.  Her neck range  of motion is full.  She has tenderness to palpation along her  lumbosacral junction but no tenderness in the cervical or thoracic  spine.  She has 5/5 strength in bilateral deltoid, biceps, triceps,  grip, as well as hip flexors, knee extensors, ankle dorsiflexion.  She  is able to toe-walk, heel-walk. She has normal joint range of motion and  joint mobility in bilateral upper and lower extremities.  Normal tone  and normal deep tendon reflexes, bilateral upper and lower extremities.  She has no evidence of upper or lower extremity edema.  She has normal  peripheral pulses and normal coordination.  Sensation is normal to  pinprick, bilateral C5-6, C7-8, L2, L3, L4, L5, S1 dermatomes.   IMPRESSION:  Probable lumbar facet syndrome.  She has had what sounds  like an anesthetic phase response to facet injections.  Will repeat  using just local and if this confirmatory set of injections  is helpful,  at least temporarily, i.e., hours, would schedule for facet medial  branch radiofrequency ablation.  This can be accomplished without  steroids or at least with a very low dose per the RF.   She is having a repeat MRI.  I will be interested to see the findings.  She feels like she may be developing some increased lower extremity  symptomatology, but it is more cramping rather than numbness or  tingling.  Certainly, her neuro exam looks okay.   Thank you for this interesting consultation.  I may perform EMG  __________.   In terms of her pain medications, she does have some already.  We  will  check a urine drug screen and be able to take over her narcotic  analgesic medications should everything check out that way.      Erick Colace, M.D.  Electronically Signed     AEK/MedQ  D:  03/20/2008 17:46:54  T:  03/20/2008 22:51:06  Job #:  [id number]   cc:   [name], M.D.  Fax: [phone]

## 2011-05-12 NOTE — Assessment & Plan Note (Signed)
[facility]                             PULMONARY OFFICE NOTE   NAME:Umholtz, Denise King                  MRN:          [id number]  DATE:11/11/2007                            DOB:          03-Mar-1951    HISTORY OF PRESENT ILLNESS:  The patient is a 60 year old white female,  patient of Dr. Kriste Basque, who has a known history of asthmatic bronchitis  and active smoker, gastroesophageal reflux, and anxiety.  The patient  presents today complaining of a one week history of nasal congestion,  fever, chills, productive cough with thick mucus, and general malaise.  The patient did notice some burning this morning with urination.  The  patient denies any hemoptysis, orthopnea, PND, nausea, vomiting, neck  pain.   PAST MEDICAL HISTORY:  Reviewed.   CURRENT MEDICATIONS:  Reviewed.   PHYSICAL EXAMINATION:  GENERAL:  The patient is a pleasant female in no  acute distress.  VITAL SIGNS:  Temperature is 99.9, blood pressure 118/64, O2 saturation  98% on room air.  HEENT:  Unremarkable.  NECK:  Supple without cervical adenopathy.  No JVD.  RESPIRATORY:  Lung sounds are coarse with some scattered rhonchi and  expiratory wheezes.  CARDIAC:  Regular rate.  ABDOMEN:  Soft and nontender.  EXTREMITIES:  Warm without any edema.  SKIN:  Warm without rash.   IMPRESSION AND PLAN:  Acute exacerbation of asthmatic bronchitis.  The  patient is to begin Levaquin 500 mg daily x7 days, Mucinex DM twice  daily, prednisone taper the next week.  Xopenex nebulizer was given  today in the office.  The patient will return back with Dr. Kriste Basque in 2  weeks for followup or sooner if needed.  The patient is encouraged on  smoking cessation.      Rubye Oaks, NP  Electronically Signed      [name], MD  Electronically Signed   TP/MedQ  DD: 11/11/2007  DT: 11/11/2007  Job #: [id number]

## 2011-05-12 NOTE — Procedures (Signed)
Denise King, Denise King           ACCOUNT NO.:  [id number]   MEDICAL RECORD NO.:  [id number]          PATIENT TYPE:  REC   LOCATION:  [facility]                          FACILITY:  [facility]   PHYSICIAN:  Erick Colace, M.D.DATE OF BIRTH:  1951-03-08   DATE OF PROCEDURE:  04/02/2008  DATE OF DISCHARGE:                               OPERATIVE REPORT   PROCEDURE:  Bilateral L5 dorsal ramus injection, bilateral L4 medial  branch block, bilateral L3 medial branch block under fluoroscopic  guidance.  Pain is only partially responsive to medication management  including narcotic analgesics.  Pain is graded as 8/10, interfering with  shopping, household duties.  MRI showing mild facet disease but no  significant compressive lesions.  She does have L4-5 annular tear.   Informed consent was obtained after describing the risks and benefits of  the procedure to the patient.  These include bleeding, bruising,  infection.  She elects to proceed and has given written consent.   The patient placed prone on the fluoroscopy table.  Betadine prep,  sterile drape.  A 25 gauge inch and a half needle was used to  anesthetize skin and subcu tissue, 1% lidocaine x2 cc.  A 22 gauge three  and a half inch spinal needle was inserted under fluoroscopic guidance,  first starting left S1 SAP sacral ala junction.  Bone contact made.  Confirmed with lateral imaging.  Omnipaque 180 x 0.5 mL demonstrated no  intravascular uptake and 0.5 mL of  2% MPF lidocaine injected.  Then the  left L5 SAP transverse process junction targeted.  Bone contact made,  confirmed with lateral imaging.  Omnipaque 180 x 0.5 mL demonstrated no  intravascular uptake and 0.5 mL of the 2% lidocaine solution was  injected.  Then the left L4 SAP transverse process junction targeted.  Bone contact made, confirmed with lateral imaging.  Omnipaque 180 x 0.5  mL demonstrated no intravascular uptake and 0.5 mL of 2% lidocaine  injected.  The same  procedure was repeated on the right side  corresponding levels using same needle injectate and procedure.  The  patient tolerated the procedure well.  Post injection instructions  given.  Follow up in terms of pain outcome, will have to give a pain  diary.      Erick Colace, M.D.  Electronically Signed     AEK/MEDQ  D:  04/02/2008 14:53:59  T:  04/02/2008 16:19:20  Job:  [id number]   cc:   [name], M.D.  Fax: [phone]

## 2011-05-12 NOTE — Discharge Summary (Signed)
Denise King, SHONTZ           ACCOUNT NO.:  [id number]   MEDICAL RECORD NO.:  [id number]          PATIENT TYPE:  OBV   LOCATION:  6733                         FACILITY:  [facility]   PHYSICIAN:  [name], MD     DATE OF BIRTH:  1951/07/16   DATE OF ADMISSION:  08/02/2007  DATE OF DISCHARGE:  08/04/2007                               DISCHARGE SUMMARY   FINAL DIAGNOSES:  1. Admitted August 02, 2007 with severe weakness, nausea, and vertigo.      Evaluation negative, and diagnosis of labyrinthitis; treated with      Phenergan and Antivert with improvement.  2. History of asthmatic bronchitis; the patient is a 1 pack a day      smoker.  3. History of gastroesophageal reflux disease - last endoscopy 2005,      with reflux esophagitis; the patient takes Protonix daily.  4. History of irritable bowel syndrome, diverticulosis, and colon      polyps.  Last colonoscopy October 2007 by Dr. [name] showed      diverticulosis.  5. Known small renal cysts on computed tomography and ultrasound.  6. History of low back pain, with recent evaluation by Dr. Charlett Blake      showing degenerative disc disease.  7. History of osteoporosis, on Evista.  8. History of kidney stones in the past, followed by Dr. Annabell Howells.  9. History of anxiety, for which she takes Xanax as needed.  10.History of headaches, with evaluation by Dr. Meryl Crutch in the past.  11.Status post appendectomy and tubal ligation from Dr. Marcelle Overlie.  12.History of allergy to SULFA and TEQUIN.   BRIEF HISTORY AND PHYSICAL:  The patient is a 60 year old white female  who was admitted with severe weakness, nausea, and vertigo of 4 days  duration.  She states she has been in good general health, has  underlying asthmatic bronchitis, and continues to smoke a pack of  cigarettes a day.  She presented for an urgent office visit, with room-  spinning dizziness, nausea without vomiting, poor appetite, and  headache.  She denied fevers, chills, or  sweats.  She denied visual  symptoms or focal neurologic changes.  Exam was negative, except for  some nystagmus on lateral gaze, and she was diagnosed with likely  labyrinthitis.  She was sent for an outpatient MRI scan and placed on a  liquid diet, Antivert, and Phenergan.  The patient's husband called,  stating that she was worse, weaker, shaky, and unable to eat, insisting  on admission for further evaluation and treatment.  The MRI/MRA done  just prior to admission was negative, without acute changes.  Routine  labs were normal, including CBC and metabolic panel.  The patient was  admitted per her request for stabilization.   PAST MEDICAL HISTORY:  1. As noted, she has a history asthmatic bronchitis and continues to      smoke a pack of cigarettes a day.  Her last chest x-ray in October      2007 showed no acute changes.  She states she is intolerant to      [facility] therapy.  2. She  has a history of gastroesophageal reflux disease and endoscopy      in September 2005 by Dr. [name] that showed reflux esophagitis.      She has been on Protonix daily.  She has had several GI evaluations      in 2005 and 2007 for abdominal pain and diarrhea, compatible with      her bowel syndrome.  She has had persistent symptoms despite      therapy.  Her last hospitalization in 2005 by GI was for abdominal      pain, nausea, and vomiting of uncertain etiology.  She had a second      opinion in 2005 per Dr. Carman Ching, who concurred.  She had a      colonoscopy in October 2007 that showed diverticulosis only.  She      had a CT scan of the abdomen in October 2007 showing small renal      cysts, otherwise negative.  Her last abdominal ultrasound in      September 2005 showed small renal cysts, otherwise negative.  3. She has a history of low back pain, with a recent evaluation by Dr.      Charlett Blake showing degenerative disc disease in three discs,      according to the patient.  She was placed on  tramadol as needed.  4. She has known osteopenia and takes Evista.  She states she is      intolerant to CALCIUM.  She does take multivitamins with D.  5. She has a history of kidney stones in the past and has been seen by      Dr. Annabell Howells.  6. She has a history of headaches and had a previous evaluation by Dr.      Meryl Crutch.  He did a CT of the brain in 2001 which was negative.  7. She has had previous appendectomy and bilateral tubal ligation.   She is allergic to SULFA and TEQUIN.   PHYSICAL EXAMINATION:  Revealed a 60 year old white female in no acute  distress.  Blood pressure 120/80, pulse 80 and regular, respirations 20  per minute, not labored, temperature 98 degrees.  HEENT exam reveals  eyes fluttering.  There was no nystagmus at the time of admission, and  fundi were benign.  Neck exam showed no jugular venous distention, no  carotid bruits, no thyromegaly or lymphadenopathy.  Chest was clear to  percussion and auscultation.  Cardiac exam revealed regular rhythm,  grade 1/6 systolic ejection murmurs at the left sternal border, no rubs  or gallops heard.  Abdomen was soft, with minimal epigastric tenderness  on palpation.  No organomegaly or masses.  Intact bowel sounds.  Rectal  was deferred.  Extremities showed no cyanosis, clubbing, or edema.  Neurologic was intact, without focal abnormalities detected.  Skin exam  was negative.   LABORATORY DATA:  EKG showed normal sinus rhythm, and no significant  changes, within normal limits.  Chest x-ray was clear, without acute  abnormalities.  MRI/MRA of the brain showed no acute abnormalities and a  normal variant in the circle of Willis - see complete report.  Hemoglobin 15.4, hematocrit 44.8, white count 9400, with normal  differential.  Sed rate 4.  Protime 12.9, INR 1, PTT 31 seconds.  Sodium  139, potassium 3.4, chloride 106, CO2 26, BUN 6, creatinine 0.6, blood  sugar 107, calcium 9.2, total protein 7.0, albumin 3.9, AST 25,  ALT 18,  alk phos 74, total bilirubin 0.7.  CPK 40, with negative MB and negative  troponin.  BNP less than 30.  TSH 0.40. Clostridium difficile toxin  negative.   HOSPITAL COURSE:  The patient was admitted as requested due to severe  dizziness, weakness, poor appetite, etc.  She was placed on a liquid  diet, given Antivert and Phenergan for nausea and IV fluids.  The  patient improved on this regimen.  She was given alprazolam for nerves.  We obtained a nutrition consult.  The patient was seen by Dr. Sherlean Foot for  orthopedics in Dr. Eilleen Kempf absence, and they recommended no additional  therapy for her degenerative disc disease.  We started Lexapro 10 mg to  aid with depression.  The patient improved on this regimen, stated she  was better on August 04, 2007, and ready for discharge home.  We arranged  for home health PT and home health nurses to check on the patient.   MEDICATIONS AT DISCHARGE:  1. Antivert 25 mg p.o. q.i.d. as needed for dizziness.  2. Phenergan 25 mg p.o. q.i.d. as needed for nausea.  3. Lexapro 10 mg p.o. daily.  4. Xanax 0.5 mg p.o. t.i.d. as needed for nerves.  5. Protonix 30 mg p.o. daily.  6. Levbid 1 p.o. b.i.d.  7. Evista 60 mg p.o. daily.  8. Tramadol 1 tablet q.6 h. as needed for pain.   The patient will follow with me in the office in 1 week.   CONDITION ON DISCHARGE:  Improved.      [name], MD  Electronically Signed     SMN/MEDQ  D:  09/07/2007  T:  09/07/2007  Job:  [id number]

## 2011-05-12 NOTE — Letter (Signed)
August 30, 2007    C. Lesia Sago, M.D.  [address]  [address]  [place], [place] [address]   RE:  Denise King, Denise King  MRN:  [id number]  /  DOB:  [birth year]   Dear Mellody Dance:   Denise King is a 60 year old patient of mine followed for  general medical purposes.  She presented last month with severe  dizziness and nausea.  He admitted her for an observation admission and  evaluation was unrevealing.  Her MRI of the brain showed no acute  intracranial abnormality and an MRA was essentially negative except for  a normal variant, MRA of the circle of Willis.  Copies of these studies  are included for your records.  I diagnosed her with acute labyrinthitis  and placed her on Antivert and Phenergan.  She had continued symptoms  post hospital and I recommended some vestibular therapy which was done  at [facility]. [facility] Outpatient PT Department.   The patient called the other day and stated she would like to see a  neurologist for second opinion and further evaluation of her dizziness.  As always, I appreciate your agreeing to see her.   Denise King other medical problems include  1. Asthmatic bronchitis (she only recently quit smoking).  2. History of gastroesophageal reflux disease.  3. Diverticulosis.  4. Irritable bowel syndrome.  5. Colon polyps.  Her last colonoscopy in October 2007, by Dr. [name]      revealed only diverticulosis.  6. She has had kidney stones in the past.  7. She has a history of low back pain and has been seen by Dr. Charlett Blake      previously.  8. She has some osteopenia and takes Evista, calcium and vitamins.  9. She has a history of anxiety and depression and has been on Xanax      and Lexapro.   ALLERGIES:  She is allergic to SULFA and TEQUIN.   OTHER MEDICATIONS:  1. Protonix 40 mg daily.  2. Levbid twice a day.   I look forward to hearing your comments.    Sincerely,      [name], MD  Electronically  Signed    SMN/MedQ  DD: 08/30/2007  DT: 08/30/2007  Job #: [id number]

## 2011-05-12 NOTE — Assessment & Plan Note (Signed)
[facility]                             PULMONARY OFFICE NOTE   NAME:Lawther, CITLALY CAMPLIN                  MRN:          [id number]  DATE:11/17/2007                            DOB:          04-18-1951    HISTORY OF PRESENT ILLNESS:  The patient is a very pleasant 60 year old  female patient of Dr. Jodelle Green who has a known history of asthmatic  bronchitis in a current smoker and presents today related to persistent  cough and congestion.  The patient was seen one week ago for an acute  bronchitis and urinary tract infection.  She was treated with a 7 day  course of Levaquin and a prednisone taper.  The patient returns back  today reporting that she has had some improvement but continues to have  cough, congestion and general malaise.  The patient is finishing up her  last day of prednisone and Levaquin today.  The patient denies any  hemoptysis, orthopnea, PND or leg swelling.   PAST MEDICAL HISTORY:  Is reviewed.   CURRENT MEDICATIONS:  Reviewed.   PHYSICAL EXAMINATION:  GENERAL APPEARANCE:  The patient is a pleasant  female in no acute distress.  VITAL SIGNS:  She is afebrile with stable vital signs.  Oxygen  saturation is 98% on room air.  HEENT: Unremarkable.  NECK:  Supple without cervical adenopathy, no jugular venous distention.  LUNG:  Lung sounds reveal a few rhonchi.  No wheezing is noted.  CARDIAC:  Regular rate.  ABDOMEN:  Soft, nontender.  EXTREMITIES:  Warm without any edema.   DATA:  Chest x-ray reveals slight increased markings along the left  lower lobe lingular area.   IMPRESSION AND PLANS:  1. A chronic obstructive pulmonary disease exacerbation with probable      left lower lobe lingular pneumonia.  The patient is to extend      Levaquin out for an additional 3 days for a total of 10 days of      therapy.  To continue on Mucinex DM twice daily.  The patient will      return back here in two weeks for followup chest x-ray or  sooner if      needed.  2. Urinary tract infection.  The patient is to continue Levaquin as      recommended and increase fluid intake.      Rubye Oaks, NP  Electronically Signed      [name], MD  Electronically Signed   TP/MedQ  DD: 11/17/2007  DT: 11/18/2007  Job #: [id number]

## 2011-05-12 NOTE — Procedures (Signed)
Denise King, Denise King           ACCOUNT NO.:  [id number]   MEDICAL RECORD NO.:  [id number]          PATIENT TYPE:  REC   LOCATION:  TPC                          FACILITY:  [facility]   PHYSICIAN:  Erick Colace, M.D.DATE OF BIRTH:  1951-12-24   DATE OF PROCEDURE:  DATE OF DISCHARGE:                               OPERATIVE REPORT   PROCEDURE:  Left sacroiliac injection under fluoroscopic guidance.   INDICATIONS:  Left sacroiliac disorder.  Pain is only partially response  to medication management and other conservative care.  She has had no  significant relief after medial branch blocks per her report.   Informed consent was obtained after describing the risks and benefits of  the procedure with the patient.  These include bleeding, bruising,  infection, temporary or permanent lower extremity weakness.  She elects  to proceed and has given written consent.  The patient was placed prone  on the fluoroscopy table with Betadine prep and sterile drape.  A 25-  gauge 1-1/2-inch needle was used to anesthetize the skin and  subcutaneous tissue with 1% lidocaine x 2 mL.  Then a 25-gauge 3-inch  spinal needle was easily inserted into the left sacroiliac joint under  AP lateral and oblique imaging.  Omnipaque 180 under live fluoro  demonstrated good joint outline, no intravascular uptake, followed by  injection of 0.5 mL of 40 mL per mL Depo-Medrol and 1 mL of 2% MPF  lidocaine.  The patient tolerated the procedure well.  Pre and post  injection vitals were stable.  Post injection instructions given.   Pre injection 6/10, post injection 4/10.  Will monitor over the next  hours and days and if significantly improved, i.e. greater 50% on the  left side, will proceed with right side injection.      Erick Colace, M.D.  Electronically Signed     AEK/MEDQ  D:  05/07/2008 12:34:30  T:  05/07/2008 13:14:26  Job:  [id number]   cc:   [name], M.D.  Fax: [phone]

## 2011-05-12 NOTE — Assessment & Plan Note (Signed)
Denise King returns today.  She was to receive another set of medial  branch blocks.  She has in the interval time followed up with Dr.  Charlett Blake.  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  There was mild bulging at L5-S1.  She saw Dr. Newell Coral  from neurosurgery who did not recommend surgical intervention.  Her main  complaints are posterior right hip pain.  She has had prior good relief  with interarticular injections and medial branch blocks of the lumbar  spine.  Similarly, she had reduction of her pain from a 7 to a 3 when we  did bilateral medial branch blocks on April 02, 2008.  She states that  her pain relief was quite short-lived, however, and probably even by the  time she got home her pain had returned.   She has been placed on prednisone pack for about 6 days.  She has got  some tremors from this, but this seems to help her pain.  Her pain is  about 4/10 right now, averaging 7.  It interferes with activity at a  moderate level.  Sleep is fair.  Pain improves with heat and medication.  Relief from medications is fair.  She can walk 10-15 minutes at a time,  climbs steps, and drives.  Her functional status is independent.  She  works in [employer] 40+ hours a week.  She needs some help  with certain household duties and shopping.   REVIEW OF SYSTEMS:  Positive for depression and anxiety.  Tremors, as  noted above.  Nausea, diarrhea.  Lymph swelling.  She has irritable  bowel syndrome.   SOCIAL HISTORY:  Married.  Lives with her husband and daughter.   FAMILY HISTORY:  Heart disease.   PHYSICAL EXAMINATION:  VITAL SIGNS:  Blood pressure 137/82, pulse 100,  respirations 18, O2 saturation 100% on room air.  GENERAL:  No acute distress.  NEUROLOGIC:  Her strength is 5/5 bilateral lower extremities.  She has  normal hip, knee, and ankle range of motion.  She has  tenderness over  the right PSI greater than the left side.  She has some minimal pain  over the right trochanteric bursa.  Her knees show no evidence of  effusion.  Her spine range of motion is good but has pain at end range  forward flexion as well as end range extension.  Sensation normal to  pinprick.  Normal deep tendon reflexes.   IMPRESSION:  Lumbar, primarily axial pain.  She has had some conflicting  results on her diagnostic testing of her lumbar spine, and I did explain  the rationale behind examining the facets as a potential pain generator,  given the possible long-term benefits of radiofrequency neurotomy should  the facet be the actual etiology of her axial pain.  Given that she has  inconclusive findings, I would not recommend radiofrequency at this  time.   We went over the spine model and went over other potential pain  generators which include disks which certainly may be the case in her,  but also given the fact that her pain is more right-sided and her MRI  findings, in terms of the annular tear, are more left-sided, would think  that the sacroiliac joint may in fact be consistent with the pain  pattern and location that she is currently experiencing.   For  this reason, I will set her up with sacroiliac injections under  fluoroscopic guidance.  She cannot do that today because of not having a  driver and having heavy work schedule this afternoon.  We will set this  up in a week or so.   The patient's questions and concerns were answered, and I wrote an  additional prescription for Vicodin 10.5/325 1 p.o. q.i.d.  She does  have a pain contract with our office at this point.      Erick Colace, M.D.  Electronically Signed     AEK/MedQ  D:  04/30/2008 17:26:09  T:  04/30/2008 18:20:28  Job #:  [id number]   cc:   [name], M.D.  Fax: [phone]   [name], MD  [address]  [place]  [place] [address]

## 2011-05-15 NOTE — Letter (Signed)
February 14, 2007    Duke Salvia. Marcelle Overlie, M.D.  [address], [address]  [place],  [place] [address]   RE:  JENIYAH, MENOR  MRN:  [id number]  /  DOB:  1951/01/24   Dear Dr. Marcelle Overlie:   Mrs. Grandinetti has been under my care with severe irritable bowel  syndrome. She has frequent complaints of abdominal pain and diarrhea,  which I have attributed to irritable bowel syndrome. She has undergone  extensive workup in the past.   I suspect that any medication potentially can cause GI upset. I think it  is certainly reasonable to try daily Evista. Should she develop GI  complaints then I would to IV Boniva. I do not see any particular  concern for restarting calcium.   I hope this answers some questions for you. Do not hesitate to contact  me.    Sincerely,      Barbette Hair. [name], MD,FACG  Electronically Signed    RDK/MedQ  DD: 02/14/2007  DT: 02/14/2007  Job #: [id number]

## 2011-05-15 NOTE — H&P (Signed)
Denise King, Denise King           ACCOUNT NO.:  [id number]   MEDICAL RECORD NO.:  [id number]          PATIENT TYPE:  INP   LOCATION:  5727                         FACILITY:  [facility]   PHYSICIAN:  Wilhemina Bonito. [name], M.D. [facility] OF BIRTH:  1951-08-06   DATE OF ADMISSION:  09/24/2004  DATE OF DISCHARGE:  09/26/2004                                HISTORY & PHYSICAL   CHIEF COMPLAINT:  Nausea, intermittent vomiting and left-sided abdominal  pain.   HISTORY:  Denise King is a pleasant 60 year old white female status post  appendectomy and bilateral tubal ligation with history of previous  ureterolithiasis and hyperlipidemia.  At this time, she presents with 2 to 3-  week history of left mid quadrant abdominal pain, fairly constant, but  varying in intensity.  Also mid low back pain.  She has not had any  diarrhea, does complain of constipation.  No melena or hematochezia.  No  fever or chills.  She has had some nausea and intermittent episodes of  nausea and vomiting lasting around 24 hours.  The last episode was about a  week prior to this admission, and she says she has been able to eat in  between, just does not feel good generally.  She complains of feeling  bloated and tender in her abdomen, and this seems to be worse later in the  day.  She was seen in the office on September 23, 2004, per Dr. [name],  thought to have probable diverticulitis.  WBC was elevated at 13.6, and labs  were otherwise unremarkable.  CT of the abdomen and pelvis was done on  September 27 which was negative with the exception of some thickening of the  duodenum and a small cyst in the tale of the pancreas which is stable since  previous CT.  She was started on Cipro and Flagyl orally last evening in  addition to oral Vicodin but could not keep her medicines down secondary to  nausea and vomiting and is therefore admitted today.  Interestingly, she was  started on Lipitor about a month ago just prior to these  symptoms starting.  No regular aspirin or NSAID use.   CURRENT MEDICATIONS:  1.  Lipitor 20 p.o. daily.  2.  Prometrium 100 q.h.s.  3.  Estratest 1 p.o. daily.   ALLERGIES:  TEQUIN AND SULFA.  Both cause nausea and vomiting.   PAST HISTORY:  As outlined above.   SOCIAL HISTORY:  The patient is married.  She has 3 children and 1  stepchild.  She is a smoker.  Says she is quitting today.  No ETOH.  She is  employed with [employer].   FAMILY HISTORY:  Negative for GI disease.   REVIEW OF SYSTEMS:  CARDIOVASCULAR: Negative for chest pain or anginal  symptoms.  PULMONARY: Negative for cough, shortness of breath, sputum production.  GENITOURINARY:  Negative.  MUSCULOSKELETAL:  Pertinent for mild low back pain.   PHYSICAL EXAM:  GENERAL: Well-developed, thin white female in no acute  distress.  VITAL SIGNS:  Temp is 97, blood pressure 122/49, pulse is 73.  HEENT:  Nontraumatic, normocephalic.  EOMI, PERLA.  Sclerae anicteric.  CARDIOVASCULAR: Regular rate and rhythm with S1 and S2.  PULMONARY:  Clear to A&P.  ABDOMEN:  Soft.  Bowel sounds are active.  She is nondistended.  She is  mildly tender in the left mid quadrant.  No guarding or rebound.  No mass or  hepatosplenomegaly.  RECTAL:  Heme negative and without masses.  EXTREMITIES:  Without clubbing, cyanosis or edema.   IMPRESSION:  39.  A 60 year old white female with 18-month history of intermittent nausea      and vomiting, 2-week history of left mid quadrant pain, etiology not      clear with negative computed tomography scan and no evidence of      diverticulitis on computed tomography.  Question duodenal bulb ulcer or      duodenitis with duodenal thickening noted on CT.  Question if she could      be having an adverse reaction to Lipitor with nausea, vomiting as this      is a new drug for her. Also consider viral gastroenteritis.  2.  Status post appendectomy and bilateral tubal ligation.  3.  Leukocytosis secondary  to #1.   PLAN:  Patient is admitted to the service of Dr. Yancey Flemings for IV fluid  hydration, empiric IV Unasyn, antiemetics.  Will check plain films, check  abdominal ultrasound, schedule upper endoscopy on Thursday, September 29,  and for details please see the orders.       AE/MEDQ  D:  09/26/2004  T:  09/27/2004  Job:  [id number]

## 2011-05-15 NOTE — Assessment & Plan Note (Signed)
[facility]                           GASTROENTEROLOGY OFFICE NOTE   NAME:Paolo, JEMA DEEGAN                  MRN:          [id number]  DATE:10/29/2006                            DOB:          10/30/1951    PROBLEMS:  Abdominal pain and diarrhea.   HISTORY OF PRESENT ILLNESS:  Ms. Brutus has returned following colonoscopy.  This demonstrated diverticulosis only.  She continues to complain of  intermittent crampy lower abdominal pain and diarrhea.  There is no history  of melena or hematochezia.  She has been taking Levbid twice a day without  improvement.  Stool studies have been negative for infection.  CT of the  abdomen and pelvis on October 04, 2006 was normal.   PHYSICAL EXAMINATION:  VITAL SIGNS:  Pulse 84, blood pressure 130/78.  Weight 124.   IMPRESSION:  Persistent intermittent crampy abdominal pain with diarrhea.  Etiology is not clear.  I suspect that this is related to severe [name].   RECOMMENDATIONS:  Patient will consider enrollment in an [name] trial.  Failing  that, I would consider an empiric trial of Xifaxan.     Barbette Hair. [name], MD,FACG  Electronically Signed    RDK/MedQ  DD: 10/29/2006  DT: 10/29/2006  Job #: [id number]

## 2011-05-15 NOTE — Discharge Summary (Signed)
Denise King, Denise King           ACCOUNT NO.:  [id number]   MEDICAL RECORD NO.:  [id number]          PATIENT TYPE:  INP   LOCATION:  5727                         FACILITY:  [facility]   PHYSICIAN:  Wilhemina Bonito. [name], M.D. [facility] OF BIRTH:  1951-08-15   DATE OF ADMISSION:  09/24/2004  DATE OF DISCHARGE:  09/26/2004                                 DISCHARGE SUMMARY   ADMITTING DIAGNOSES:  1.  [age]-old white female with 12-month history of intermittent      nausea and vomiting and 2-week history of left sided abdominal      discomfort, etiology unclear, with no evidence for diverticulitis on CT      scan, ? duodenal bulb ulcer, ? adverse reaction to Lipitor, ?      gastroenteritis.  2.  Status post appendectomy and bilateral tubal ligation.  3.  Leukocytosis secondary to #1.   DISCHARGE DIAGNOSIS:  Nausea and vomiting, improved, etiology not clear;  abdominal discomfort improved, again, etiology not clear with negative  evaluation, with the exception of reflux esophagitis found on  esophagogastroduodenoscopy.  I suspect she has had a viral gastroenteritis  versus medication adverse reaction secondary to Lipitor which was started  approximately 1 month ago.   CONSULTATIONS:  None.   PROCEDURES:  1.  Upper abdominal ultrasound.  2.  Upper endoscopy.   HISTORY:  Denise King is a 60 year old white female known to Dr. Teena Irani.  Denise King, who presented with 2- to 3-week history of left mid-quadrant  abdominal discomfort, fairly constant, but varying in intensity, also mid  low back pain.  She has had nausea with intermittent episodes of vomiting  over the past month as well; the last episode was about a week prior to  admission.  She had been able to eat in between, but says she just does not  feel good.  She complains of feeling bloated and tender in her abdomen.  She  was seen in the office on September 23, 2004 and was started on Cipro and  Flagyl, underwent CT scan of the abdomen  and pelvis on September 23, 2004,  which was negative with the exception of some thickening of the duodenum.  There was no evidence of diverticulitis.  She called today stating that she  was still having pain and was unable to keep down the antibiotics due to  nausea and vomiting; she is therefore admitted for symptomatic treatment and  further diagnostic workup.   X-RAY STUDIES:  Two-view abdomen was negative, with the exception of some  partially fluid-filled stomach.   Abdominal ultrasound showed small renal cysts, otherwise, negative exam.   LABORATORY STUDIES:  Laboratory studies on admission showed WBC of 9.4,  hemoglobin 15, hematocrit of 43; pro time 12.6, INR of 0.9; electrolytes  within normal limits; BUN 5, creatinine 0.8; sed rate of 5.   HOSPITAL COURSE:  The patient was admitted to the service of Dr. Wilhemina Bonito.  [name], who was covering the hospital.  She was placed on a liquid diet, IV  Protonix, covered empirically with IV Unasyn and given Demerol and Phenergan  as needed for control of pain  and nausea.  She underwent plain abdominal  films, which were unremarkable, then abdominal ultrasound, which was also  negative for any acute etiology of her symptoms.  She had upper endoscopy on  September 25, 2004 with Dr. [name] which did show moderate esophagitis, felt  to be reflux-induced.  It is not clear whether this was causing all of her  symptoms, however, she was continued on Protonix and this was converted to  oral.  We gradually advanced her diet and on September 26, 2004, she was  felt stable for discharge to home with instructions to follow up with Dr.  Arlyce King in the office on October 07, 2004 and to call for any problems in the  interim.  She was asked to stop her Lipitor, as it was possible that this  was causing her an adverse reaction; this will need to be discussed with her  primary doctor regarding starting another agent.   DISCHARGE MEDICATIONS:  She was  discharged on:  1.  Protonix 40 mg daily.  2.  Phenergan 25 mg q.6 h. p.r.n. nausea and vomiting.  3.  Robinul Forte 2 mg daily for abdominal spasm.  4.  Resume her Prometrium and Estratest as previous.       AE/MEDQ  D:  09/26/2004  T:  09/27/2004  Job:  [id number]

## 2011-05-15 NOTE — Assessment & Plan Note (Signed)
[facility]                         GASTROENTEROLOGY OFFICE NOTE   NAME:Santellan, ERZA MOTHERSHEAD                  MRN:          [id number]  DATE:01/27/2007                            DOB:          January 04, 1951    PROBLEM:  Abdomen and diarrhea.   Ms. Basham has returned once again complaining of worsening abdominal  pain and diarrhea.  Pain is mostly in the left lower quadrant.  She has  had profuse diarrhea.  Both symptoms have been controlled with Levbid.   PHYSICAL EXAMINATION:  VITAL SIGNS:  Pulse 78, blood pressure 102/78,  weight 126.  ABDOMEN:  Without masses, tenderness, or organomegaly.   IMPRESSION:  Recurrent abdominal pain with diarrhea.  I believe this is  most likely due to irritable bowel syndrome.  Regional enteritis is a  consideration, albeit less likely though ought to be ruled out.   RECOMMENDATIONS:  Capsule endoscopy.     Barbette Hair. [name], MD,FACG  Electronically Signed    RDK/MedQ  DD: 01/27/2007  DT: 01/27/2007  Job #: [id number]

## 2011-05-15 NOTE — Assessment & Plan Note (Signed)
[facility]                           GASTROENTEROLOGY OFFICE NOTE   NAME:Denise King, Denise King                  MRN:          [id number]  DATE:09/28/2006                            DOB:          08/12/1951    PROBLEM:  Diarrhea and left sided abdominal crampy pain.   HISTORY:  Denise King is a pleasant 60 year old white female known to Dr.  [name] who had undergone colonoscopy in February 2006 for complaints of  abdominal pain and bloating.  She was found to have descending colon and  sigmoid colon diverticulosis, otherwise, normal exam.  She had had  hospitalization in 2005 with nausea and vomiting and abdominal discomfort.  She did not have a definite diagnosis at that time, possibly a viral  gastroenteritis, and her symptoms did resolve.  She has had some [name] type  symptoms in the past and has had complaints of left lower quadrant pain, as  well.  She was treated for diverticulitis in 2006, as well.  She comes in  today stating that she has been having diarrhea for the past 3-4 months, at  least, generally having 6-8 bowel movements a day, usually having loose  stools in the mornings and sometimes after lunch.  She has not noticed any  melena or hematochezia, she says that all of her stools have been loose and  she is not having normal bowel movements inbetween.  About one week ago, she  started with left sided abdominal crampy discomfort which has been fairly  constant.  She has not had any fever or chills.  She had called in and was  started back on Levbid twice a day which she says is still helping a bit  with the cramping.  Her appetite has also been off over the past couple of  months and she has had a subsequent weight loss of about 12 pounds.  She has  not been on any new medications or supplements.  She did take an antibiotic,  she says, about two months ago for cellulitis on her thigh and had a  reaction to that with a drug fever.  She says  she took the medicine for  about a week.  She reports that she had her diarrhea prior to taking that  antibiotic, however.   CURRENT MEDICATIONS:  Protonix 40 mg daily, Levbid one p.o. b.i.d.   ALLERGIES:  SULFA AND TEQUIN.   PHYSICAL EXAMINATION:  GENERAL:  Well developed white female in no acute distress.  VITAL SIGNS:  Blood pressure 110/70, pulse 86, weight 125.4, her weight in  March 2006 was 137.  CARDIOVASCULAR:  Regular rate and rhythm with S1 and S2.  PULMONARY:  Clear to A&P.  ABDOMEN:  Soft, bowel sounds active, she is mildly tender in the left lower  quadrant, there is no guarding or rebound, no mass or hepatosplenomegaly.  RECTAL:  Not done today.   IMPRESSION:  60 year old female with a several month history of diarrhea now  with associated crampy type left sided abdominal pain x1 week and weight  loss of 12 pounds over the past couple of months.  The etiology of  her  symptoms is not clear, question underlying infectious colitis or IBD, C.  diff is in the differential, though it is felt that this is less likely.  The symptoms are not consistent with diverticulitis given the several month  history of diarrhea.   PLAN:  1. Continue Levbid one p.o. b.i.d.  2. Check CBC, sed rate, stool for C. diff and C&S, and stool for WBCs      today.  3. Treat with course of Flagyl 250 q.i.d. x14 days, Flora-Q one p.o. daily      x10 days, and Cipro 500 b.i.d. x7 days.  4. Schedule flexible sigmoidoscopy within the next one week.  5. Return office visit in 2-3 weeks.      ______________________________  Mike Gip, PA-C    ______________________________  Barbette Hair. [name], MD,FACG     AE/MedQ  DD:  09/28/2006  DT:  09/29/2006  Job #:  [id number]

## 2011-05-15 NOTE — Discharge Summary (Signed)
Denise King, MOFFITT                     ACCOUNT NO.:  [id number]   MEDICAL RECORD NO.:  [id number]                   PATIENT TYPE:  INP   LOCATION:  0478                                 FACILITY:  [facility]   PHYSICIAN:  [name], M.D. [facility]            DATE OF BIRTH:  11-Dec-1951   DATE OF ADMISSION:  04/23/2003  DATE OF DISCHARGE:  04/26/2003                                 DISCHARGE SUMMARY   FINAL DIAGNOSES:  1. Admitted on 04/23/03, with persistent left lower quadrant pain and     associated nausea and vomiting.  Evaluation revealing probable small area     of diverticulitis in the sigmoid colon.  Gastrointestinal consultation     with Dr. Dara Hoyer this admission.  2. Probable irritable bowel syndrome with abdominal cramping and alternating     regularity for many years.  3. History of headaches with evaluation by Dr. Meryl Crutch in the past; the     patient takes imipramine and ________ for this.  4. Mild asthmatic bronchitis, the patient smokes one pack a day.  5. History of kidney stones in the past, evaluated by Dr. Annabell Howells.  6. Status post bilateral tubal ligation, Dr. Marcelle Overlie.   HISTORY OF PRESENT ILLNESS:  The patient is a 60 year old white female, new  patient to my practice, presented with probable diverticulitis.  She had a  two day history of abdominal pain awakening at 4 a.m. two days prior to  admission with left lower quadrant and mid abdominal discomfort associated  with some blood seen in her bowel movement.  She went to [facility]  where she was evaluated, felt to have diverticulitis, and started on Cipro.  Her white blood cell count was 12,500 at that time.  A CT scan was not  performed, and no further studies were obtained.  She came to see me in the  office not feeling any better on several days of Cipro.  Her examination  showed some mild diffuse tenderness localizing more to the left lower  quadrant, and a CT scan was obtained, felt to be  compatible with some mild  diverticulitis.  As the pain persisted and was associated with bloating,  gas, abdominal swelling, some nausea and vomiting, and more blood in the  stools, she was admitted for further evaluation.  There is a positive family  history of colon cancer in her father.  No previous history of known GI  problems.   PAST MEDICAL HISTORY:  1. History of irritable bowel syndrome with crampy lower abdominal pain for     years, on and off with alternating regularity.  She had never been     previously evaluated or been on medication for this.  2. She has a history of headaches in which she has seen Dr. Meryl Crutch in the     past.  She is on imipramine and ________.  3. She is a smoker, one pack per  day for many years, has mild cough and     sputum production, no acute respiratory problems.  4. History of kidney stones, was evaluated by Dr. Annabell Howells in the past.  5. Previous bilateral tubal ligation by Dr. Marcelle Overlie.   ALLERGIES:  SULFA.   PHYSICAL EXAMINATION:  GENERAL:  A healthy-appearing 60 year old white  female in no acute distress.  VITAL SIGNS:  Blood pressure 128/80, pulse 98 to 100 and regular,  respirations 22 per minute and not labored, temperature 98.9.  HEENT:  Unremarkable.  NECK:  Negative without jugular venous distention, carotid bruits,  thyromegaly, or lymphadenopathy.  CHEST:  Clear to auscultation.  CARDIAC:  Regular rhythm, normal S1 and S2 without murmurs, rubs, or gallops  heard.  ABDOMEN:  Slight decrease in bowel sounds and tenderness throughout the  lower quadrants, especially in the left with some voluntary guarding, but no  rebound.  This was diffusely tender there.  Stool was weakly Hematest  positive.  EXTREMITIES:  No cyanosis, clubbing, or edema.  NEUROLOGIC:  Intact without focal abnormalities detected.   LABORATORY DATA:  A 12-lead EKG showed normal sinus rhythm and some minor  nonspecific ST-T wave changes.  No acute abnormalities.   Three-way abdomen  showed no active disease within the chest, no evidence of free air, some CT  contrast present with non-dilated colon, and question of a non-obstructive  ileus in the small bowel.  Subsequent repeat CT of the abdomen showed small  hepatic and bilateral renal cysts which were unchanged;  minimal bowel wall  thickening within the sigmoid colon, and several small diverticula believed  to represent mild diverticulitis;  these changes appear to be less prominent  then on the study of 04/19/03.  Hemoglobin 14.8, hematocrit 42.7, white blood  cell count 8400, with 48% segs.  Sedimentation rate 10.  Prothrombin time  13, INR 0.9, PTT 34 seconds.  Sodium 137, potassium 3.6, chloride 105, CO2  24, BUN 3, creatinine 0.8, blood sugar 124.  Calcium 9, total protein 7.1,  albumin 3.8.  AST 20, ALT 13, alkaline phosphatase 44, total bilirubin 0.6.  TSH 0.62.  Urinalysis showed some white blood cells, and a trace of  leukocytes.  One stool for occult blood was sent and has returned negative.   HOSPITAL COURSE:  The patient was admitted with what appeared to be a  refractory case of diverticulitis that was unresponsive to her outpatient  antibiotics.  She was placed on IV Cipro, given IV fluids, and given some  Levsin sublingually as needed for abdominal cramping.  She was also given  Demerol and Phenergan for pain and nausea.  The patient was seen in  consultation by Dr. Melvia Heaps of the GI service.  He agreed that she  likely had a case of mild diverticulitis, but wondered about the possibility  of a segmental ischemic colitis as well.  He felt that repeat CT scan should  be performed.  This was done revealing changes above, and indicating some  improvement from the previous CT scan a week earlier.  With clear liquid  diet, she was slowly advanced to full liquids and given bowel rest.  This improved her condition in the hospital and Dr. [name] felt she was stable  enough for  discharge on oral Cipro to finish out a two week course.  He did  want to see her back in the office in about one month's time, at which time  a full diagnostic colonoscopy will be undertaken.  She  was instructed on a  low residue diet, felt to be MHB and ready for discharge on 04/26/03.   DISCHARGE MEDICATIONS:  1. Cipro 500 mg p.o. b.i.d. x5 days to finish out a two week course.  2. Levsin 0.125 mg sublingual tablets one tablet under the tongue q.6h.     p.r.n. abdominal cramping.  3. She will resume her home medications, including Estrogen, Prometrium,     imipramine, and ________tablets.  4. Tylenol p.r.n. pain.   DIET:  She will follow a low residue diet.   FOLLOWUP:  She will follow up with Dr. [name] on Thursday, 05/17/03, at 3:30  p.m., and keep her previously scheduled follow up appointment with me on  05/10/03.   CONDITION ON DISCHARGE:  Improved.                                               [name], M.D. [facility]    SMN/MEDQ  D:  04/26/2003  T:  04/26/2003  Job:  [id number]

## 2011-05-15 NOTE — Assessment & Plan Note (Signed)
[facility]                                   ON-CALL NOTE   NAME:Denise King, Denise King                  MRN:          [id number]  DATE:10/03/2006                            DOB:          September 11, 1951    CALLER:  Berneice Gandy.   TIME OF CALL:  9:45 p.m.   DATE OF CALL:  10/03/2006   The patient's husband, Denise King, called this evening concerned because his  wife complained that her heart was racing. Dr. [name] apparently saw the  patient about a week ago and diagnosed diverticulitis. He has treated her  with Ciprofloxacin, metronidazole and Levsin. She apparently was doing well  until this evening when she describes fast heart rate. No shortness of  breath. No chest pain. No other symptoms. Some anxiety according to the  husband. Still having some lower abdominal discomfort as well. No fevers. I  told him that I was not certain of the significance of the complaint. Told  him that the heart rate of 96 was normal. Did not think it was a medication  reaction. She is on no other medications and according to him has no other  medical problems. Did say that if they are concerned or if she feels poorly  that she should proceed to the emergency room for evaluation or contact Dr.  Kriste Basque for further advise. If her abdomen is bothering her at all- to contact  the office and let Dr. [name] know to see if he would like to do anything  differently. They understood and agreed.            ______________________________  Wilhemina Bonito. Eda Keys., MD      JNP/MedQ  DD:  10/03/2006  DT:  10/04/2006  Job #:  [id number]   cc:   Barbette Hair. [name], MD,FACG  [name], MD

## 2011-05-15 NOTE — Assessment & Plan Note (Signed)
[facility]                             PULMONARY OFFICE NOTE   NAME:Denise King, Denise King                  MRN:          [id number]  DATE:01/04/2007                            DOB:          [birth year]    HISTORY OF PRESENT ILLNESS:  The patient is a very pleasant 60 year old  white female patient of Dr. Jodelle Green who has a known history of asthmatic  bronchitis and a current smoker, irritable bowel syndrome and  diverticulosis who presents for acute onset and complaining of a 2-week  history of nasal congestion, sore throat and cough. The patient denies  any hemoptysis, orthopnea, PND, or leg swelling. The patient reports she  has been having some difficulty with her irritable bowel syndrome and  diarrhea over the last several months and in fact required  hospitalization by Dr. [name] for persistent diarrhea.   PAST MEDICAL HISTORY:  Reviewed.   CURRENT MEDICATIONS:  Reviewed.   PHYSICAL EXAMINATION:  The patient is a pleasant female in no acute  distress. She is afebrile with stable vital signs. O2 saturation is 99%  on room air.  HEENT: Nasal mucosa with mild erythema. Nontender sinuses. Posterior  pharynx is clear without any exudate.  NECK: Supple without adenopathy.  LUNGS: Lung sounds reveal coarse breath sounds without any wheezing.  CARDIAC: Is a regular rate.  ABDOMEN: Soft without any hepatosplenomegaly.  EXTREMITIES: Warm without edema.  SKIN: Is warm without rash.   IMPRESSION/PLAN:  Acute tracheal bronchitis. The patient is to begin  Omnicef x7 days, Mucinex DM twice daily, Endal HD #8 ounces 1 to 2  teaspoons every 4 to 6 hours as needed for cough. The patient is advised  on smoking cessation. The patient is to follow back up with Dr. Kriste Basque in  2 to 3 months for complete physical examination. The patient does report  she receives her gynecological care through Dr. Marcelle Overlie and is due for  her mammogram later today, which has already  been scheduled.     Rubye Oaks, NP  Electronically Signed      [name], MD  Electronically Signed   TP/MedQ  DD: 01/04/2007  DT: 01/04/2007  Job #: [id number]

## 2011-06-17 ENCOUNTER — Ambulatory Visit (INDEPENDENT_AMBULATORY_CARE_PROVIDER_SITE_OTHER): Payer: 59

## 2011-06-17 DIAGNOSIS — D649 Anemia, unspecified: Secondary | ICD-10-CM

## 2011-06-17 MED ORDER — CYANOCOBALAMIN 1000 MCG/ML IJ SOLN
1000.0000 ug | Freq: Once | INTRAMUSCULAR | Status: AC
  Administered 2011-06-17: 1000 ug via INTRAMUSCULAR

## 2011-06-28 IMAGING — CT CT L SPINE W/O CM
4 of 11 series · 10 of 33 positions shown, 12 images · non-contrast
Comparison: 06/12/2009 and 03/06/2009.

CLINICAL DATA: Back pain.

CT LUMBAR SPINE WITHOUT CONTRAST
TECHNIQUE: Multidetector CT imaging of the lumbar spine was
performed without intravenous contrast administration. Multiplanar
CT image reconstructions were also generated.

[Series 3: l spine bone · axial · 0.27mm/px · z∈[-158,-85]mm · 2 of 87 slices shown, 3 images]
[im 29/87  soft-tissue]
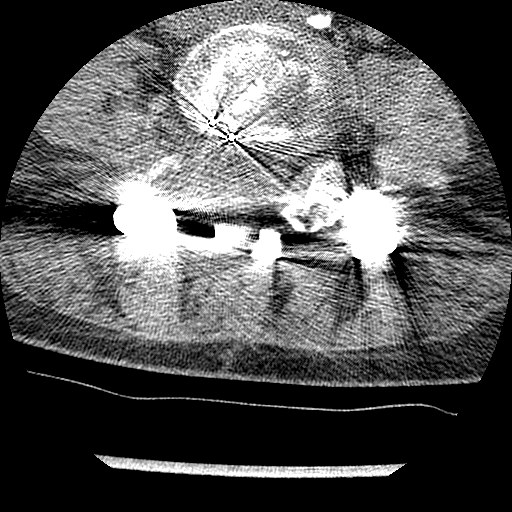
[im 29/87  bone]
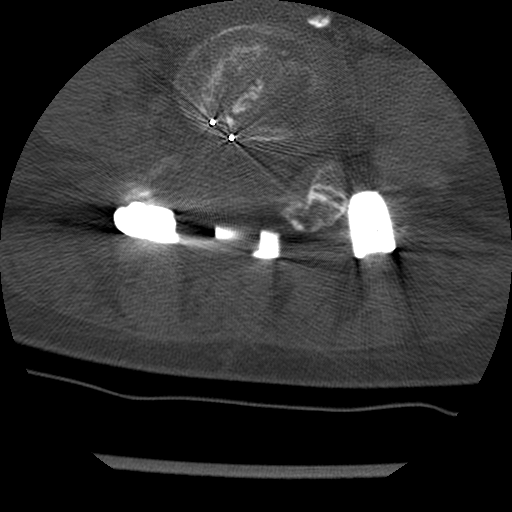
[im 58/87  bone]
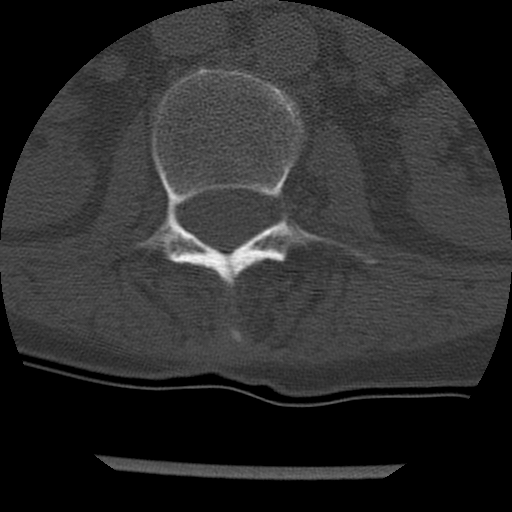

[Series 4: l spine soft · axial · 0.27mm/px · z∈[-158,-85]mm · 2 of 87 slices shown]
[im 29/87  soft-tissue]
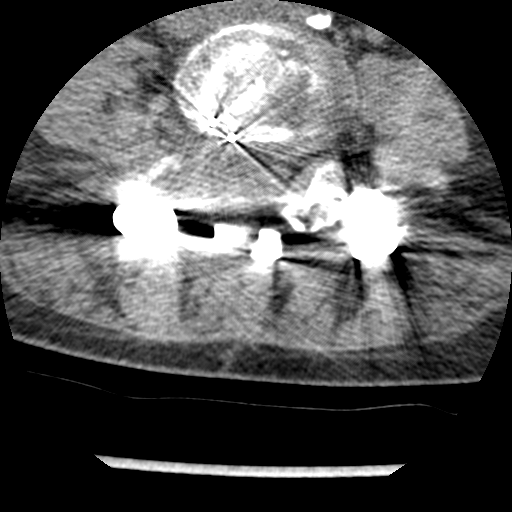
[im 58/87  soft-tissue]
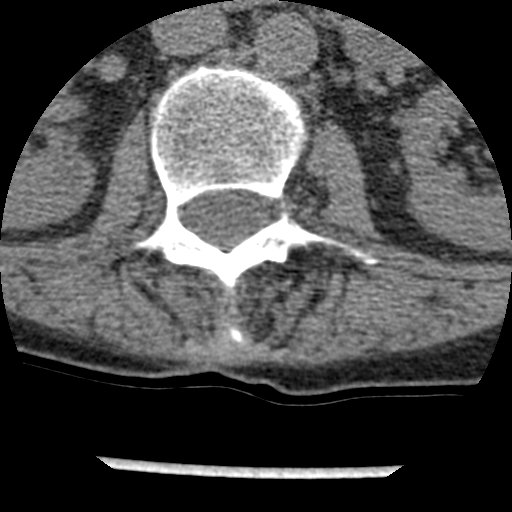

[Series 200: cor upper l-spine · coronal · 0.43mm/px · 1 of 40 slices shown]
[im 20/40  bone]
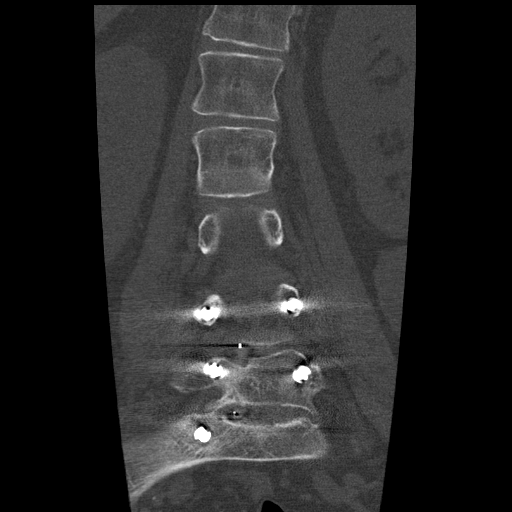

[Series 202: sag l-spine · sagittal · 0.43mm/px · 5 of 50 slices shown, 6 images]
[im 17/50  bone]
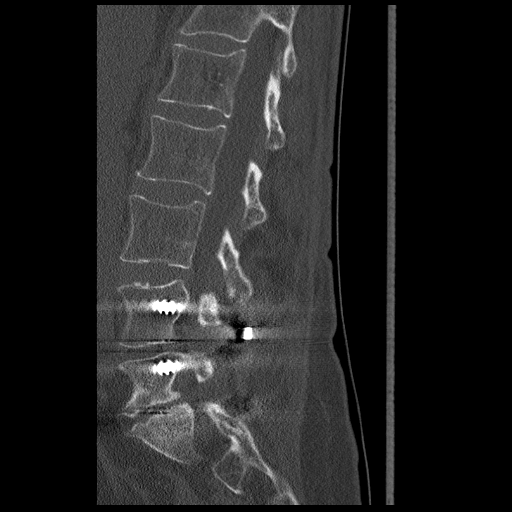
[im 21/50  bone]
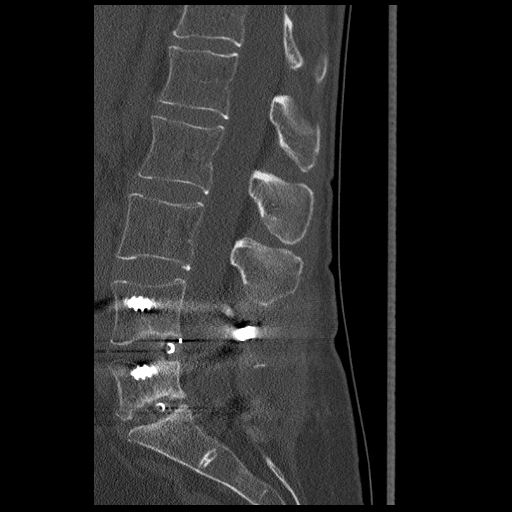
[im 25/50  soft-tissue]
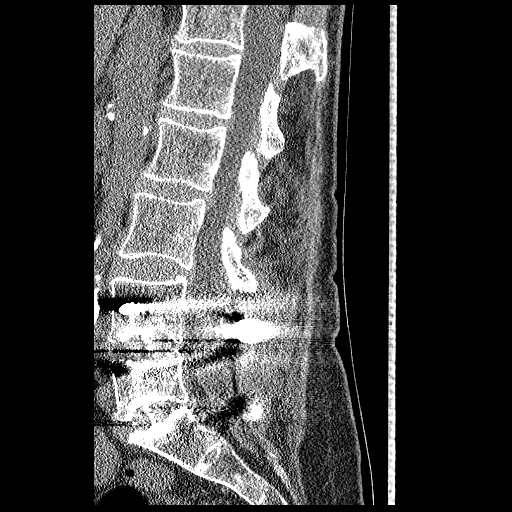
[im 25/50  bone]
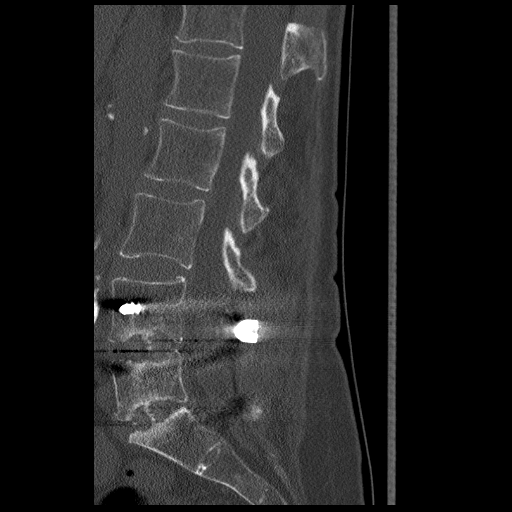
[im 29/50  bone]
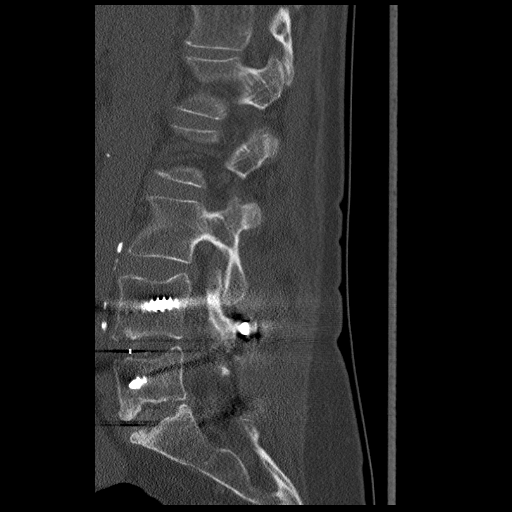
[im 33/50  bone]
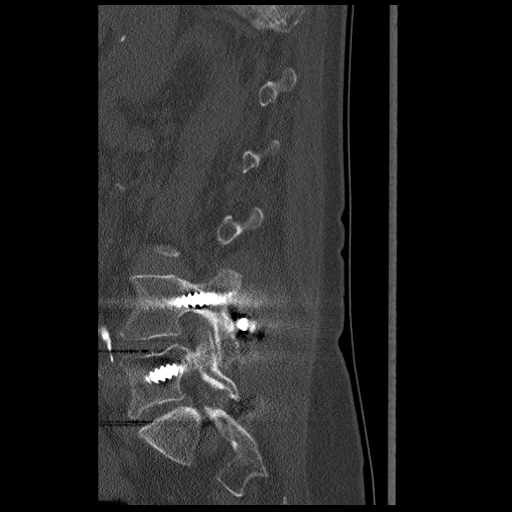

[10 of 33 positions shown; findings below may reference images not displayed]

FINDINGS: The sagittal reformatted images demonstrate normal
alignment of the lumbar vertebral bodies.  Stable mild right convex
scoliotic curvature.  There are stable surgical changes related to
lumbar fusion from L4-S1 with pedicle screws, posterior rods and
interbody bone spacers.  Stable mild lucency around the S1 pedicle
screws bilaterally.  The L4 and L5 screws appeared normal and
stable.

No significant interbody osseous fusion changes are demonstrated at
L4-5 or L5-S1.

Stable diffuse bulging annulus at L2-3.  No significant findings at
L1-2 or L3-4.
IMPRESSION: 1.  Stable mild lucency around the S1 pedicle screws bilaterally.
2.  No significant interbody osseous fusion changes are
demonstrated at L4-5 or L5-S1.  Three stable diffuse bulging
annulus at L2-3.

## 2011-07-16 ENCOUNTER — Ambulatory Visit (INDEPENDENT_AMBULATORY_CARE_PROVIDER_SITE_OTHER): Payer: 59

## 2011-07-16 DIAGNOSIS — D649 Anemia, unspecified: Secondary | ICD-10-CM

## 2011-07-17 DIAGNOSIS — D649 Anemia, unspecified: Secondary | ICD-10-CM

## 2011-07-17 MED ORDER — CYANOCOBALAMIN 1000 MCG/ML IJ SOLN
1000.0000 ug | Freq: Once | INTRAMUSCULAR | Status: AC
  Administered 2011-07-17: 1000 ug via INTRAMUSCULAR

## 2011-08-03 ENCOUNTER — Other Ambulatory Visit: Payer: Self-pay | Admitting: Dermatology

## 2011-08-04 ENCOUNTER — Other Ambulatory Visit (INDEPENDENT_AMBULATORY_CARE_PROVIDER_SITE_OTHER): Payer: 59

## 2011-08-04 ENCOUNTER — Telehealth: Payer: Self-pay | Admitting: Gastroenterology

## 2011-08-04 ENCOUNTER — Emergency Department (HOSPITAL_COMMUNITY)
Admission: EM | Admit: 2011-08-04 | Discharge: 2011-08-04 | Disposition: A | Discharge status: Home / Self Care | Payer: 59 | Attending: Emergency Medicine | Admitting: Emergency Medicine

## 2011-08-04 DIAGNOSIS — K589 Irritable bowel syndrome without diarrhea: Secondary | ICD-10-CM | POA: Insufficient documentation

## 2011-08-04 DIAGNOSIS — R109 Unspecified abdominal pain: Secondary | ICD-10-CM | POA: Insufficient documentation

## 2011-08-04 DIAGNOSIS — R112 Nausea with vomiting, unspecified: Secondary | ICD-10-CM | POA: Insufficient documentation

## 2011-08-04 DIAGNOSIS — K5792 Diverticulitis of intestine, part unspecified, without perforation or abscess without bleeding: Secondary | ICD-10-CM

## 2011-08-04 DIAGNOSIS — R197 Diarrhea, unspecified: Secondary | ICD-10-CM

## 2011-08-04 DIAGNOSIS — K5732 Diverticulitis of large intestine without perforation or abscess without bleeding: Secondary | ICD-10-CM

## 2011-08-04 LAB — COMPREHENSIVE METABOLIC PANEL
ALT: 18 U/L (ref 0–35)
AST: 46 U/L — ABNORMAL HIGH (ref 0–37)
Albumin: 4.3 g/dL (ref 3.5–5.2)
Alkaline Phosphatase: 87 U/L (ref 39–117)
BUN: 3 mg/dL — ABNORMAL LOW (ref 6–23)
CO2: 28 mEq/L (ref 19–32)
Calcium: 9.5 mg/dL (ref 8.4–10.5)
Chloride: 100 mEq/L (ref 96–112)
Creatinine, Ser: <0.47 mg/dL — ABNORMAL LOW (ref 0.50–1.10)
Glucose, Bld: 98 mg/dL (ref 70–99)
Potassium: 3.2 mEq/L — ABNORMAL LOW (ref 3.5–5.1)
Sodium: 138 mEq/L (ref 135–145)
Total Bilirubin: 0.7 mg/dL (ref 0.3–1.2)
Total Protein: 7.5 g/dL (ref 6.0–8.3)

## 2011-08-04 LAB — BASIC METABOLIC PANEL
BUN: 4 mg/dL — ABNORMAL LOW (ref 6–23)
CO2: 25 mEq/L (ref 19–32)
Calcium: 8.8 mg/dL (ref 8.4–10.5)
Chloride: 102 mEq/L (ref 96–112)
Creatinine, Ser: 0.4 mg/dL (ref 0.4–1.2)
GFR: 155.99 mL/min (ref >60.00)
Glucose, Bld: 120 mg/dL — ABNORMAL HIGH (ref 70–99)
Potassium: 3.4 mEq/L — ABNORMAL LOW (ref 3.5–5.1)
Sodium: 139 mEq/L (ref 135–145)

## 2011-08-04 LAB — CBC WITH DIFFERENTIAL/PLATELET
Basophils Absolute: 0.1 10*3/uL (ref 0.0–0.1)
Basophils Relative: 0.6 % (ref 0.0–3.0)
Eosinophils Absolute: 0 10*3/uL (ref 0.0–0.7)
Eosinophils Relative: 0.4 % (ref 0.0–5.0)
HCT: 42.3 % (ref 36.0–46.0)
Hemoglobin: 14.6 g/dL (ref 12.0–15.0)
Lymphocytes Relative: 47.1 % — ABNORMAL HIGH (ref 12.0–46.0)
Lymphs Abs: 4.7 10*3/uL — ABNORMAL HIGH (ref 0.7–4.0)
MCHC: 34.4 g/dL (ref 30.0–36.0)
MCV: 100.4 fl — ABNORMAL HIGH (ref 78.0–100.0)
Monocytes Absolute: 0.7 10*3/uL (ref 0.1–1.0)
Monocytes Relative: 7.2 % (ref 3.0–12.0)
Neutro Abs: 4.5 10*3/uL (ref 1.4–7.7)
Neutrophils Relative %: 44.7 % (ref 43.0–77.0)
Platelets: 274 10*3/uL (ref 150.0–400.0)
RBC: 4.22 Mil/uL (ref 3.87–5.11)
RDW: 12.7 % (ref 11.5–14.6)
WBC: 10.1 10*3/uL (ref 4.5–10.5)

## 2011-08-04 LAB — CBC
HCT: 42.5 % (ref 36.0–46.0)
Hemoglobin: 15.5 g/dL — ABNORMAL HIGH (ref 12.0–15.0)
MCH: 35.1 pg — ABNORMAL HIGH (ref 26.0–34.0)
MCHC: 36.5 g/dL — ABNORMAL HIGH (ref 30.0–36.0)
MCV: 96.2 fL (ref 78.0–100.0)
Platelets: 258 10*3/uL (ref 150–400)
RBC: 4.42 MIL/uL (ref 3.87–5.11)
RDW: 11.9 % (ref 11.5–15.5)
WBC: 10.9 10*3/uL — ABNORMAL HIGH (ref 4.0–10.5)

## 2011-08-04 LAB — DIFFERENTIAL
Basophils Absolute: 0 10*3/uL (ref 0.0–0.1)
Basophils Relative: 0 % (ref 0–1)
Eosinophils Absolute: 0.1 10*3/uL (ref 0.0–0.7)
Eosinophils Relative: 1 % (ref 0–5)
Lymphocytes Relative: 47 % — ABNORMAL HIGH (ref 12–46)
Lymphs Abs: 5.1 10*3/uL — ABNORMAL HIGH (ref 0.7–4.0)
Monocytes Absolute: 1 10*3/uL (ref 0.1–1.0)
Monocytes Relative: 9 % (ref 3–12)
Neutro Abs: 4.8 10*3/uL (ref 1.7–7.7)
Neutrophils Relative %: 44 % (ref 43–77)

## 2011-08-04 LAB — SEDIMENTATION RATE: Sed Rate: 9 mm/hr (ref 0–22)

## 2011-08-04 NOTE — Telephone Encounter (Signed)
Dr. [name] pt is complaining of LLQ abdominal pain and cramping. States she has had the pain and diarrhea for about 6 days. Pt states everything is going through her and she feels dehydrated. Pt has history of [name]/Diverticulosis. Dr. Christella Hartigan as doc of the day please advise.

## 2011-08-04 NOTE — Telephone Encounter (Signed)
Pt aware. Instructed pt to go to the ER if she felt that she needed IV fluids. Pt verbalized understanding.

## 2011-08-04 NOTE — Telephone Encounter (Signed)
We don't have ability to give IV fluids in the office.  If she really thinks she is dehyrated, should go to urgent clinic or ER.  Ask if she 's been on any antibiotics recently, sick contacts.  Has she tried anything (immodium) yet to slow the stools down?

## 2011-08-04 NOTE — Telephone Encounter (Signed)
Pt has not tried anything to stop or slow the diarrhea. States Dr. [name] has not given her anything to do that is the past. Pt states she has not been on any antibiotics recently or been around anyone that was sick.

## 2011-08-04 NOTE — Telephone Encounter (Signed)
Ok.  She should start once daily otc immodium every AM (only stop if she becomes constipated).  Can take a second one in early afternoon if needed.  Also should have cbc, bmet, esr, routine stool culture, c diff checked.

## 2011-08-10 NOTE — Progress Notes (Signed)
Ok, she needs rov with Dr. [name] or extender sometime this week.  thanks

## 2011-08-11 ENCOUNTER — Telehealth: Payer: Self-pay

## 2011-08-11 NOTE — Telephone Encounter (Signed)
Called pt and left a message with her husband to have her call back.

## 2011-08-11 NOTE — Telephone Encounter (Signed)
Spoke with pt and she states that she thought she was doing better but that the diarrhea started back today. Pt scheduled to see Willette Cluster NP 08/13/11@1 :30pm. Pt aware of appt date and time.

## 2011-08-11 NOTE — Telephone Encounter (Signed)
Message copied by Michele Mcalpine on Tue Aug 11, 2011  8:39 AM ------      Message from: Donata Duff      Created: Mon Aug 10, 2011  1:17 PM                   ----- Message -----         From: Rob Bunting, MD         Sent: 08/10/2011   1:07 PM           To: Chales Abrahams, CMA            Needs rov with Dr. [name] or an extender sometime this week            Thanks                        ----- Message -----         From: Chales Abrahams, CMA         Sent: 08/10/2011  10:37 AM           To: Rob Bunting, MD            Pt aware and the diarrhea has gotten much better but she still has the pain on the left side

## 2011-08-12 ENCOUNTER — Ambulatory Visit (INDEPENDENT_AMBULATORY_CARE_PROVIDER_SITE_OTHER): Payer: 59

## 2011-08-12 DIAGNOSIS — D649 Anemia, unspecified: Secondary | ICD-10-CM

## 2011-08-13 ENCOUNTER — Ambulatory Visit (INDEPENDENT_AMBULATORY_CARE_PROVIDER_SITE_OTHER): Payer: 59 | Admitting: Physician Assistant

## 2011-08-13 ENCOUNTER — Other Ambulatory Visit (INDEPENDENT_AMBULATORY_CARE_PROVIDER_SITE_OTHER): Payer: 59

## 2011-08-13 ENCOUNTER — Encounter: Payer: Self-pay | Admitting: Physician Assistant

## 2011-08-13 VITALS — BP 94/60 | HR 100 | Ht 64.0 in | Wt 125.0 lb

## 2011-08-13 DIAGNOSIS — R197 Diarrhea, unspecified: Secondary | ICD-10-CM

## 2011-08-13 DIAGNOSIS — D649 Anemia, unspecified: Secondary | ICD-10-CM

## 2011-08-13 DIAGNOSIS — R109 Unspecified abdominal pain: Secondary | ICD-10-CM

## 2011-08-13 LAB — CBC WITH DIFFERENTIAL/PLATELET
Basophils Absolute: 0.1 10*3/uL (ref 0.0–0.1)
Basophils Relative: 0.5 % (ref 0.0–3.0)
Eosinophils Absolute: 0.1 10*3/uL (ref 0.0–0.7)
Eosinophils Relative: 0.9 % (ref 0.0–5.0)
HCT: 40.5 % (ref 36.0–46.0)
Hemoglobin: 13.8 g/dL (ref 12.0–15.0)
Lymphocytes Relative: 45.7 % (ref 12.0–46.0)
Lymphs Abs: 4.3 10*3/uL — ABNORMAL HIGH (ref 0.7–4.0)
MCHC: 34.1 g/dL (ref 30.0–36.0)
MCV: 102.5 fl — ABNORMAL HIGH (ref 78.0–100.0)
Monocytes Absolute: 0.8 10*3/uL (ref 0.1–1.0)
Monocytes Relative: 8.7 % (ref 3.0–12.0)
Neutro Abs: 4.2 10*3/uL (ref 1.4–7.7)
Neutrophils Relative %: 44.2 % (ref 43.0–77.0)
Platelets: 329 10*3/uL (ref 150.0–400.0)
RBC: 3.95 Mil/uL (ref 3.87–5.11)
RDW: 13.1 % (ref 11.5–14.6)
WBC: 9.5 10*3/uL (ref 4.5–10.5)

## 2011-08-13 LAB — BASIC METABOLIC PANEL
BUN: 6 mg/dL (ref 6–23)
CO2: 30 mEq/L (ref 19–32)
Calcium: 8.7 mg/dL (ref 8.4–10.5)
Chloride: 99 mEq/L (ref 96–112)
Creatinine, Ser: 0.8 mg/dL (ref 0.4–1.2)
GFR: 81.77 mL/min (ref >60.00)
Glucose, Bld: 98 mg/dL (ref 70–99)
Potassium: 4 mEq/L (ref 3.5–5.1)
Sodium: 139 mEq/L (ref 135–145)

## 2011-08-13 MED ORDER — DICYCLOMINE HCL 10 MG PO CAPS: ORAL_CAPSULE | ORAL | Status: DC

## 2011-08-13 MED ORDER — ALIGN PO CAPS: 1.0000 | ORAL_CAPSULE | Freq: Every day | ORAL | Status: DC

## 2011-08-13 MED ORDER — CYANOCOBALAMIN 1000 MCG/ML IJ SOLN
1000.0000 ug | Freq: Once | INTRAMUSCULAR | Status: AC
  Administered 2011-08-13: 1000 ug via INTRAMUSCULAR

## 2011-08-13 NOTE — Patient Instructions (Addendum)
Go to the lab, basement level. Push fluids. We sent a prescription for Bentyl to your pharmacy, [facility]. Follow up with Dr. [name] in a few weeks.

## 2011-08-13 NOTE — Progress Notes (Signed)
Reviewed and agree with management. Carley Strickling T. Melissaann Dizdarevic MD FACG 

## 2011-08-13 NOTE — Progress Notes (Signed)
Subjective:    Patient ID: Denise King, female    DOB: 07/25/1951, 60 y.o.   MRN: [id number]  HPI Denise King is a 60 year old female known to Dr. [name] with history of [name], GERD, and diverticulosis. Her last colonoscopy was done earlier this year and was normal, she also had random biopsies to rule out microscopic colitis and these were negative.  She comes in today with complaints of diarrhea over the past 3 weeks. She says that she feels that this is similar to her [name] flareups though it is lasting longer and the diarrhea hasn't been more significant than she has had in the past. She last took antibiotics towards the end of April 2012. She says she generally does not feel well I has been feeling weak and fatigue. She has also had some nausea without vomiting no fever or chills. Initially in onset she was having 7-8 watery stools per day and actually went to the ER about a week and a half ago because she felt dehydrated. She did have labs done and was given Zofran and Lomotil for home. She was to do stool cultures but did not return these. She says the diarrhea is not as bad now but she still feels bloated and uncomfortable. Some daysays She Is Still Having Diarrhea 6-7 Times per Day but Yesterday and Today Has Not Had Any Bowel Movements. She Has Not Noted Any Melena or Hematochezia.  She Is Currently Out Of Work and Has Been Out Of Work over the Past Couple of Weeks Due To Problems with Her Back and Is in the Midst of Applying for Disability Due to Her Chronic Pain.    Review of Systems  Constitutional: Positive for activity change, appetite change and fatigue.  HENT: Negative.   Eyes: Negative.   Respiratory: Negative.   Cardiovascular: Negative.   Gastrointestinal: Positive for diarrhea.  Genitourinary: Negative.   Musculoskeletal: Positive for back pain.  Skin: Negative.   Neurological: Positive for weakness.  Hematological: Negative.   Psychiatric/Behavioral: Negative.     Outpatient Prescriptions Prior to Visit  Medication Sig Dispense Refill  . ALPRAZolam (XANAX) 0.5 MG tablet Take 1/2 to 1 tablet by mouth three times a days as needed for anxiety       . Cholecalciferol (VITAMIN D3) 1000 UNITS tablet Take 1,000 Units by mouth daily.        . clidinium-chlordiazePOXIDE (LIBRAX) 2.5-5 MG per capsule Take 1 capsule by mouth 3 (three) times daily as needed.        . Doxylamine Succinate, Sleep, (UNISOM) 25 MG tablet Take 25 mg by mouth at bedtime as needed.        . DULoxetine (CYMBALTA) 60 MG capsule Take 60 mg by mouth daily.        . furosemide (LASIX) 20 MG tablet Take 20 mg by mouth daily as needed.       . Multiple Vitamins-Minerals (MULTIVITAMIN WITH MINERALS) tablet Take 1 tablet by mouth daily.        Marland Kitchen oxyCODONE-acetaminophen (PERCOCET) 10-325 MG per tablet Take 1 tablet by mouth up to 4 times daily as directed by Dr. Vear Clock       . oxymorphone (OPANA ER) 20 MG 12 hr tablet Take 20 mg by mouth every 12 (twelve) hours. As directed by Dr. Vear Clock.       . pantoprazole (PROTONIX) 40 MG tablet Take 40 mg by mouth daily.        . promethazine (PHENERGAN) 25 MG tablet Take 25  mg by mouth every 6 (six) hours as needed.        . zoledronic acid (RECLAST) 5 MG/100ML SOLN Inject 5 mg into the vein once. yearly       . potassium chloride (KLOR-CON) 10 MEQ CR tablet Take 10 mEq by mouth daily as needed. Take with lasix      . Probiotic Product (ALIGN) 4 MG CAPS Take 1 capsule by mouth daily.        Marland Kitchen azithromycin (ZITHROMAX) 250 MG tablet Take 2 tablets by mouth on day 1, followed by 1 tablet by mouth daily for 4 days.      . Hydrocodone-Homatropine 5-1.5 MG TABS 1 tablet every 4 hours x5days      . Oxycodone HCl 10 MG TABS Take 10 mg by mouth 4 (four) times daily as needed.           Objective:   Physical Exam Well-developed white female in no acute distress, alert and oriented x3 HEENT; nontraumatic normocephalic EOMI PERRLA sclera anicteric buccal mucosa  moist, neck; supple no JVD, Cardiovascula;r regular rate and rhythm with S1-S2 slightly tachycardia with pulse 100, Pulmonary; clear bilaterally ,Abdomen; soft, bowel sounds active, no focal tenderness, no mass or palpable hepatosplenomegaly, Rectal; Hemoccult-negative, extremities; no clubbing cyanosis or edema, Psych; affect flat otherwise appropriate.        Assessment & Plan:  #1 60 yo female with history of [name] and diverticulosis, with 3 week history of diarrhea and generalized fatigue. Suspect this is her [name], however need to rule out infectious etiology i.e. C. difficile.  Plan; Will repeat CBC and Bmet  today  Obtain stool for C. difficile PCR selective ferritin stool culture Resume all lying one daily over the next 4-6 weeks Refill until 10 mg 3 times daily as needed for cramping or diarrhea Oral fluids and soft bland diet Followup with Dr. [name] in 2 weeks .

## 2011-08-18 ENCOUNTER — Telehealth: Payer: Self-pay | Admitting: Gastroenterology

## 2011-08-18 ENCOUNTER — Other Ambulatory Visit: Payer: 59

## 2011-08-18 DIAGNOSIS — R197 Diarrhea, unspecified: Secondary | ICD-10-CM

## 2011-08-18 NOTE — Telephone Encounter (Signed)
Pt wanted to let us know she is bringing in the stool specimen to the lab today.

## 2011-08-19 LAB — FECAL LACTOFERRIN, QUANT: Lactoferrin: NEGATIVE

## 2011-08-19 LAB — CLOSTRIDIUM DIFFICILE BY PCR: Toxigenic C. Difficile by PCR: NOT DETECTED

## 2011-08-21 ENCOUNTER — Telehealth: Payer: Self-pay | Admitting: Gastroenterology

## 2011-08-21 NOTE — Telephone Encounter (Signed)
Pt given the stool culture and cdiff results.

## 2011-08-22 LAB — STOOL CULTURE

## 2011-08-25 ENCOUNTER — Telehealth: Payer: Self-pay | Admitting: *Deleted

## 2011-08-25 NOTE — Telephone Encounter (Signed)
Left a message with husband for patient to call me. 

## 2011-08-25 NOTE — Telephone Encounter (Signed)
Message copied by Daphine Deutscher on Tue Aug 25, 2011  2:51 PM ------      Message from: [place], [place] S      Created: Tue Aug 25, 2011  2:17 PM       Please ask her to come back for b12, and folate levels, based on most recent labs

## 2011-08-26 ENCOUNTER — Other Ambulatory Visit (INDEPENDENT_AMBULATORY_CARE_PROVIDER_SITE_OTHER): Payer: 59

## 2011-08-26 ENCOUNTER — Other Ambulatory Visit: Payer: Self-pay | Admitting: *Deleted

## 2011-08-26 DIAGNOSIS — E538 Deficiency of other specified B group vitamins: Secondary | ICD-10-CM

## 2011-08-26 LAB — FOLATE: Folate: 6.1 ng/mL (ref >5.9)

## 2011-08-26 LAB — VITAMIN B12: Vitamin B-12: 517 pg/mL (ref 211–911)

## 2011-08-26 NOTE — Telephone Encounter (Signed)
Patient notified of Mike Gip, [place] recommendations and she will come for labs today.. She states she had another episode of almost fainting yesterday with the heart racing and shaking uncontrollable. She is calling Dr. Kriste Basque to set up and appointment to get checked out for this.

## 2011-08-27 ENCOUNTER — Telehealth: Payer: Self-pay | Admitting: *Deleted

## 2011-08-27 NOTE — Telephone Encounter (Signed)
Patient given results. She wants Mike Gip, PA to know she does take B12 injections per Dr. Kriste Basque. Also, states she is out of work again today due to constant diarrhea. States she has had 8-9 watery stools today and has cramping and LLQ abdominal pain. She is taking Imodium and Librax without relief.

## 2011-08-27 NOTE — Telephone Encounter (Signed)
Message copied by Daphine Deutscher on Thu Aug 27, 2011  2:26 PM ------      Message from: [place], [place] S      Created: Thu Aug 27, 2011  2:13 PM       b12 and folate are normal

## 2011-08-28 MED ORDER — DIPHENOXYLATE-ATROPINE 2.5-0.025 MG PO TABS: ORAL_TABLET | ORAL | Status: DC

## 2011-08-28 NOTE — Telephone Encounter (Signed)
Left a message for patient to call me. 

## 2011-08-28 NOTE — Telephone Encounter (Signed)
She may try lomotil,one every 4 hours as needed for diarrhea, instead of immodium-can call in 50/0 refills-she needs to follow up with dr [name], thanks

## 2011-08-28 NOTE — Telephone Encounter (Signed)
Addended by: Daphine Deutscher on: 08/28/2011 11:01 AM   Modules accepted: Orders

## 2011-09-01 NOTE — Telephone Encounter (Signed)
Spoke with patient and rx sent to [facility]

## 2011-09-03 ENCOUNTER — Other Ambulatory Visit: Payer: Self-pay | Admitting: Pulmonary Disease

## 2011-09-09 ENCOUNTER — Ambulatory Visit (INDEPENDENT_AMBULATORY_CARE_PROVIDER_SITE_OTHER): Payer: 59

## 2011-09-09 DIAGNOSIS — D649 Anemia, unspecified: Secondary | ICD-10-CM

## 2011-09-10 DIAGNOSIS — D649 Anemia, unspecified: Secondary | ICD-10-CM

## 2011-09-10 MED ORDER — CYANOCOBALAMIN 1000 MCG/ML IJ SOLN
1000.0000 ug | Freq: Once | INTRAMUSCULAR | Status: AC
  Administered 2011-09-10: 1000 ug via INTRAMUSCULAR

## 2011-09-21 ENCOUNTER — Encounter: Payer: Self-pay | Admitting: Gastroenterology

## 2011-09-21 ENCOUNTER — Ambulatory Visit (INDEPENDENT_AMBULATORY_CARE_PROVIDER_SITE_OTHER): Payer: 59 | Admitting: Gastroenterology

## 2011-09-21 VITALS — BP 148/80 | HR 88 | Ht 64.0 in | Wt 125.0 lb

## 2011-09-21 DIAGNOSIS — K589 Irritable bowel syndrome without diarrhea: Secondary | ICD-10-CM

## 2011-09-21 MED ORDER — ALOSETRON HCL 1 MG PO TABS: 1.0000 mg | ORAL_TABLET | Freq: Two times a day (BID) | ORAL | Status: DC

## 2011-09-21 NOTE — Progress Notes (Signed)
History of Present Illness:  Denise King has returned for followup of her diarrhea. Symptoms continue rather unabated. She may have 5-8 stools daily accompanied by urgency and occasional incontinence. She also complains of persistent left lower quadrant discomfort. Colonoscopy with random biopsies in January, 2012 was unremarkable.    Review of Systems: Pertinent positive and negative review of systems were noted in the above HPI section. All other review of systems were otherwise negative.    Current Medications, Allergies, Past Medical History, Past Surgical History, Family History and Social History were reviewed in [employer] electronic medical record  Vital signs were reviewed in today's medical record. Physical Exam: General: Well developed , well nourished, no acute distress

## 2011-09-21 NOTE — Patient Instructions (Addendum)
We have given you information  and a new prescription of Lotronex Please follow up in 4-5 weeks You have signed Lotronex patient acknowledgement form

## 2011-09-22 NOTE — Assessment & Plan Note (Signed)
She has diarrhea predominant [name] that, so far, has not been successfully treated.  Recommendations 1)  Trial of lotronex (warning were reviewed with the patient)

## 2011-09-28 ENCOUNTER — Telehealth: Payer: Self-pay | Admitting: Gastroenterology

## 2011-09-28 NOTE — Telephone Encounter (Signed)
She should HOLD lotronex.  C/b when she starts having BMs again. Call if abd pain worsens

## 2011-09-28 NOTE — Telephone Encounter (Signed)
Pt aware.

## 2011-09-28 NOTE — Telephone Encounter (Signed)
Pt was placed on Lotronex 09/21/11 for [name]-Diarrhea. Pt reports that she started having constipation on Saturday. She did not take her Lotronex yesterday or today. Dr. [name] please advise.

## 2011-10-06 ENCOUNTER — Telehealth: Payer: Self-pay | Admitting: Gastroenterology

## 2011-10-06 MED ORDER — HYOSCYAMINE SULFATE 0.125 MG SL SUBL: 0.1250 mg | SUBLINGUAL_TABLET | SUBLINGUAL | Status: DC | PRN

## 2011-10-06 NOTE — Telephone Encounter (Signed)
Clarified with Dr. [name] and hyomax 0.125 sent to the pharmacy.

## 2011-10-06 NOTE — Telephone Encounter (Signed)
hyomax 0.2mg  s.l. q4h prn #25

## 2011-10-06 NOTE — Telephone Encounter (Signed)
Pt states she was supposed to have another rx sent to her pharmacy and they have not received it. Pt states she was supposed to get something SL sent in for cramping. Dr. [name] please advise.

## 2011-10-10 IMAGING — CR DG ABDOMEN ACUTE W/ 1V CHEST
3 series · 3 of 3 positions shown · non-contrast
Comparison: None

CLINICAL DATA: Left-sided abdominal pain.  Nausea.  Diarrhea.
Shortness breath.

ACUTE ABDOMEN SERIES (ABDOMEN 2 VIEW & CHEST 1 VIEW)

[w chest pa]
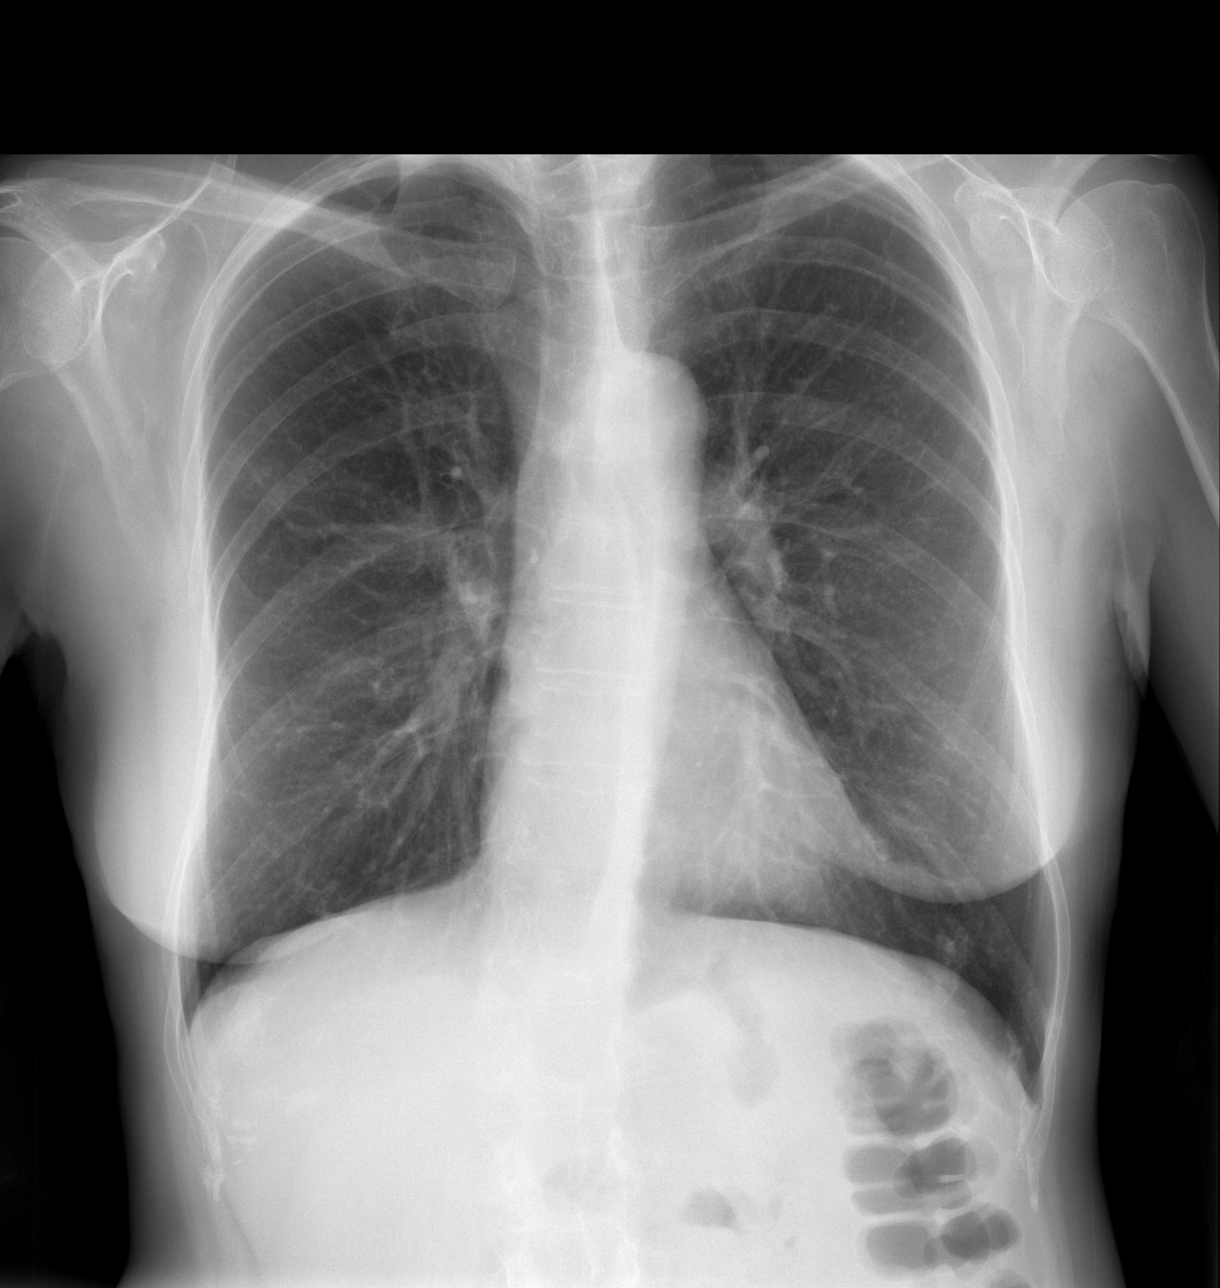

[w abdomen upright]
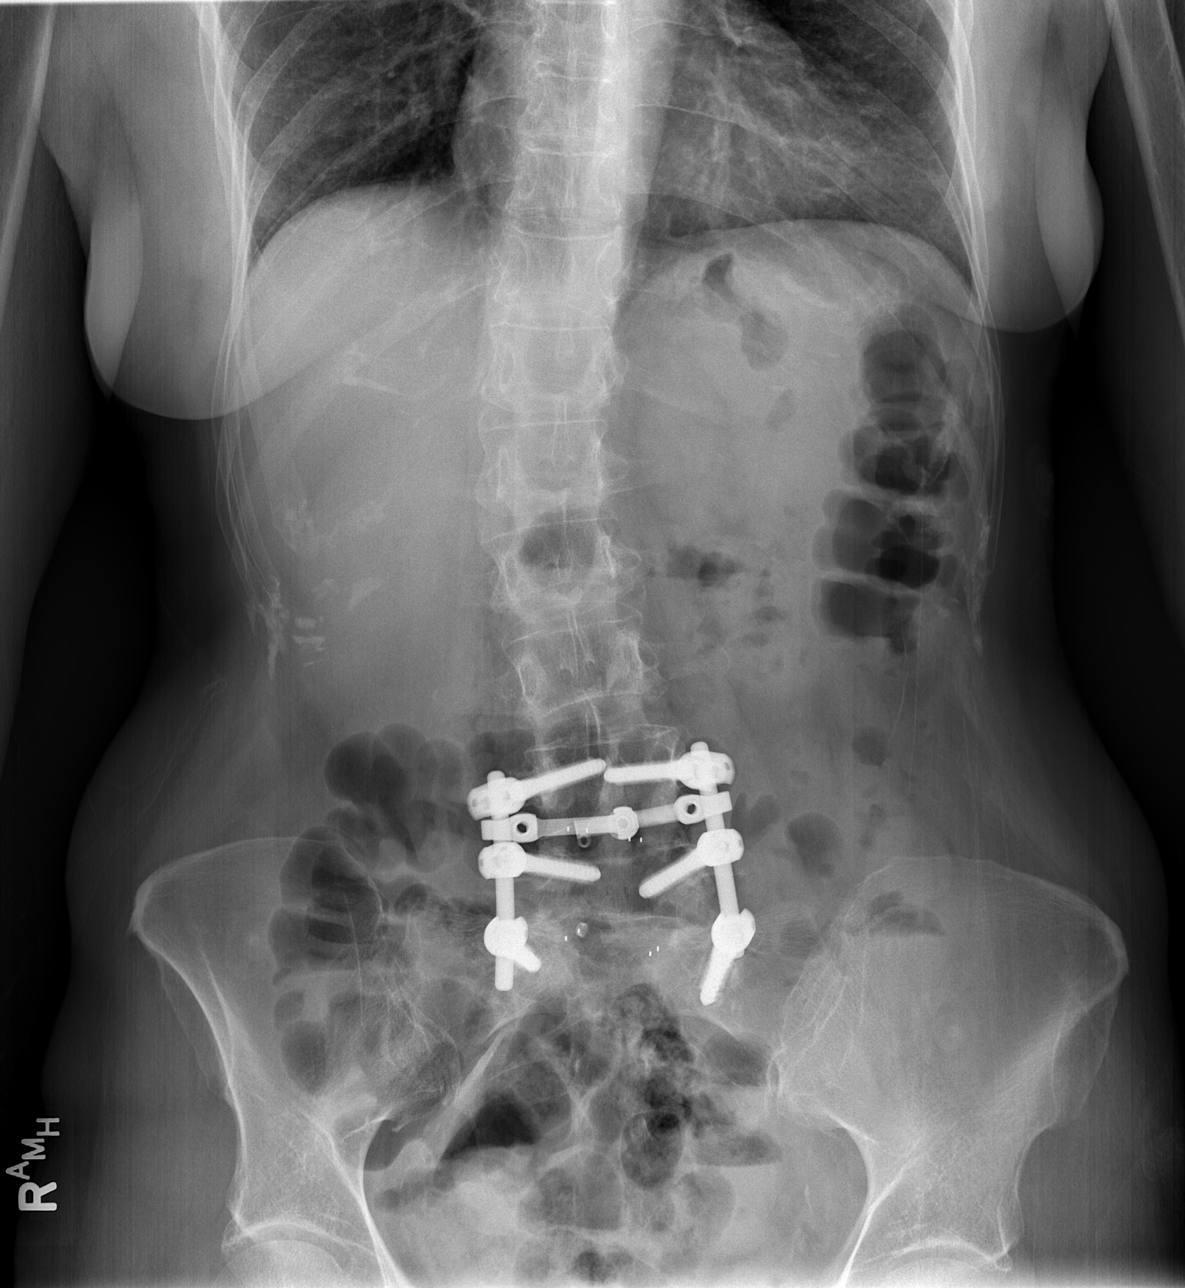

[t abdomen supine]
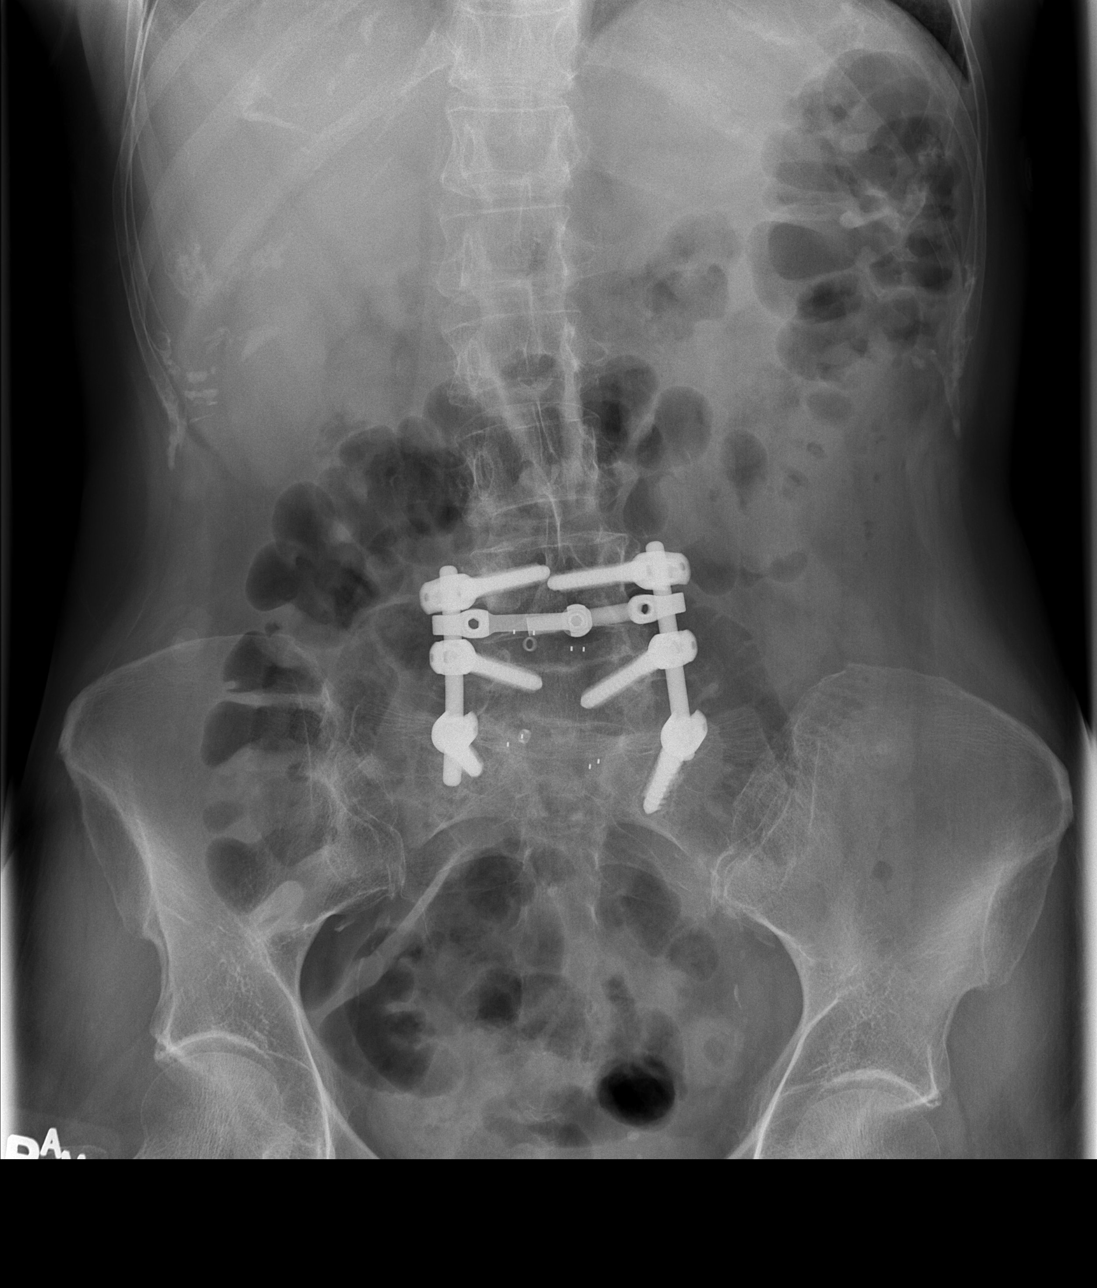

[3 of 3 positions shown; findings below may reference images not displayed]

FINDINGS: Nondilated small bowel loops are seen in the left lower
quadrant containing air fluid levels.  This is suspicious for a
focal ileus.  There is no evidence of bowel obstruction, with bowel
gas seen to the level the rectum.  There is no evidence of free
air.

Heart size and mediastinal contours are normal.  Both lungs are
clear. Pulmonary hyperinflation again seen, consistent with COPD.
IMPRESSION: 1.  Probable focal ileus in left lower quadrant, suspicious for
underlying inflammatory process.
2.  No evidence of bowel obstruction or free air.
3.  No active cardiopulmonary disease.

## 2011-10-10 IMAGING — CT CT ABD-PELV W/ CM
2 of 5 series · 17 of 46 positions shown, 19 images · IV contrast (APPLIED)
Comparison: 01/11/2009

CLINICAL DATA: Left lower quadrant pain.  Nausea vomiting.
Diarrhea.

CT ABDOMEN AND PELVIS WITH CONTRAST
TECHNIQUE: Multidetector CT imaging of the abdomen and pelvis was
performed following the standard protocol during bolus
administration of intravenous contrast.
Contrast: 100 ml Pmnipaque-D88 and oral contrast

[Series 2: abd/pelv with 5.0 b31f st · axial · 0.70mm/px · z∈[+874,+1229]mm · 14 of 81 slices shown, 16 images]
[im 5/81  soft-tissue]
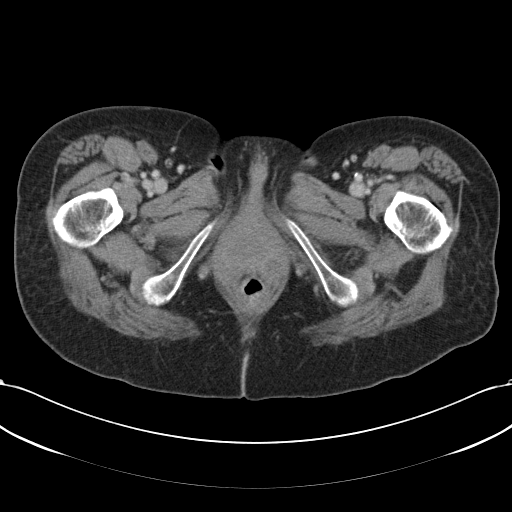
[im 5/81  bone]
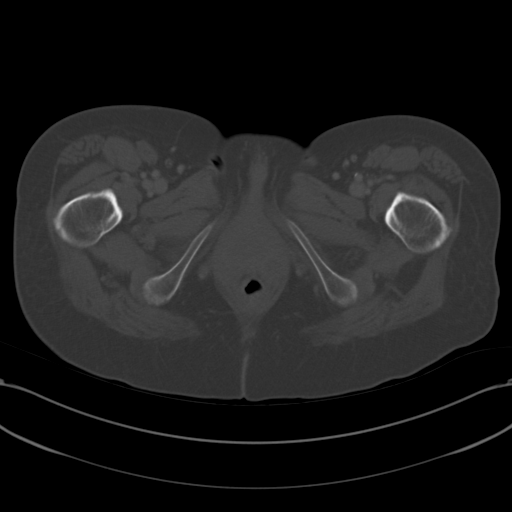
[im 9/81  soft-tissue]
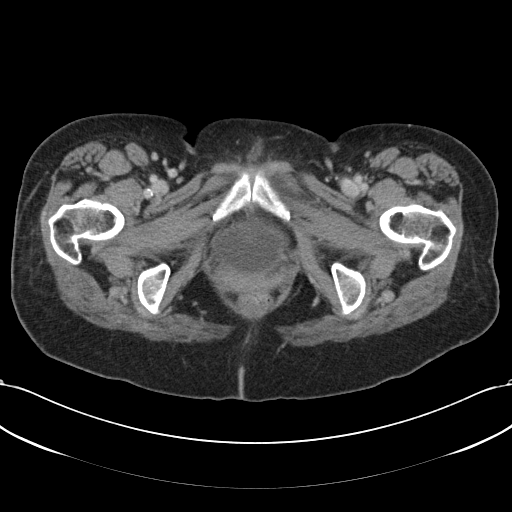
[im 18/81  soft-tissue]
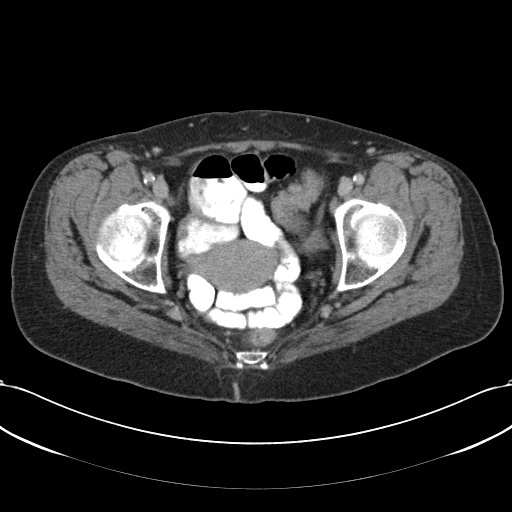
[im 23/81  soft-tissue]
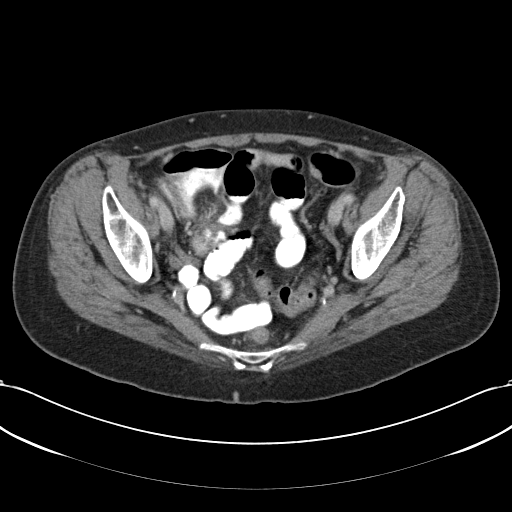
[im 27/81  soft-tissue]
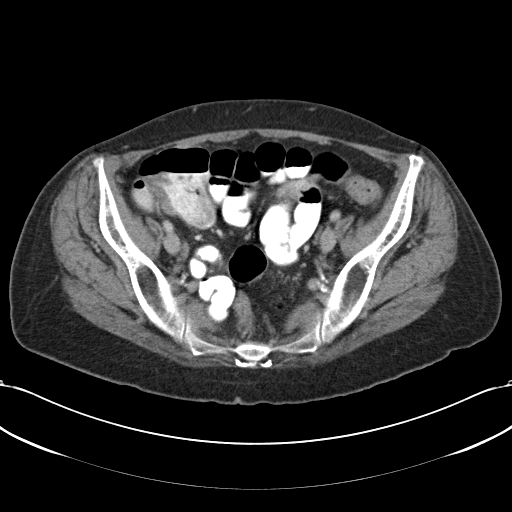
[im 32/81  soft-tissue]
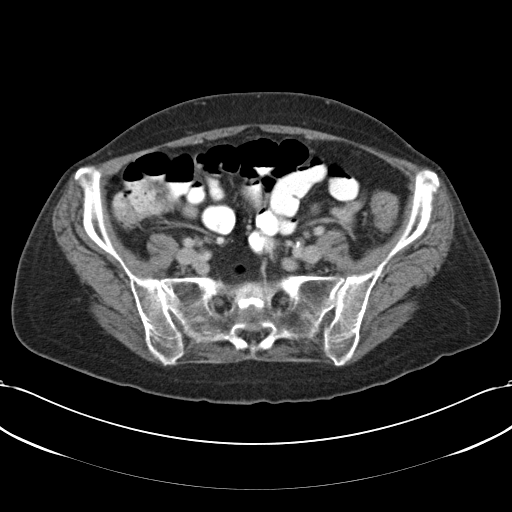
[im 36/81  soft-tissue]
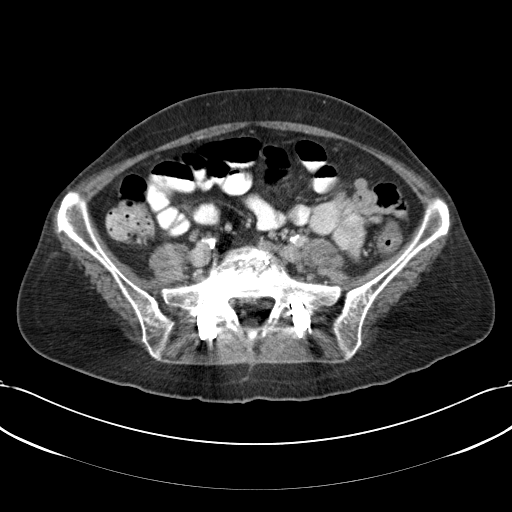
[im 45/81  soft-tissue]
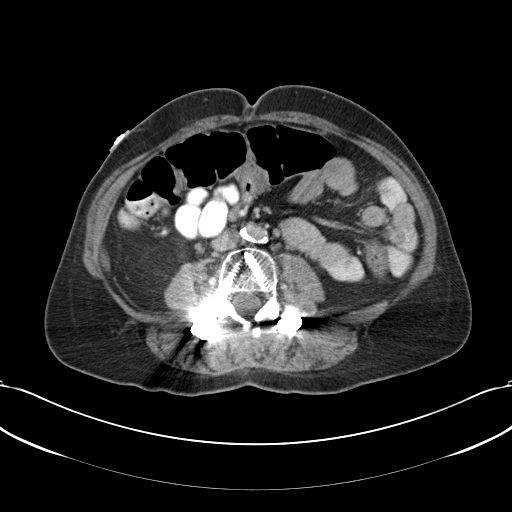
[im 49/81  soft-tissue]
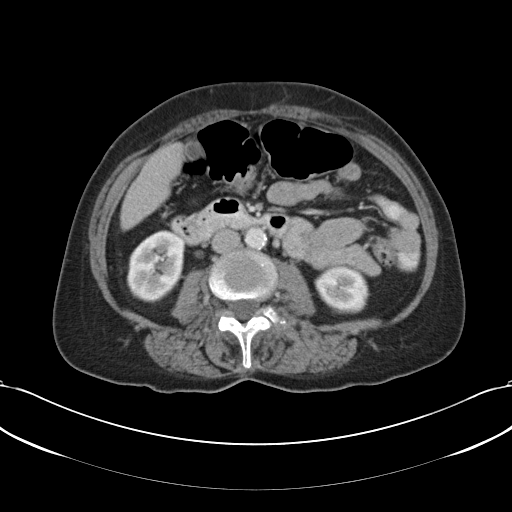
[im 49/81  bone]
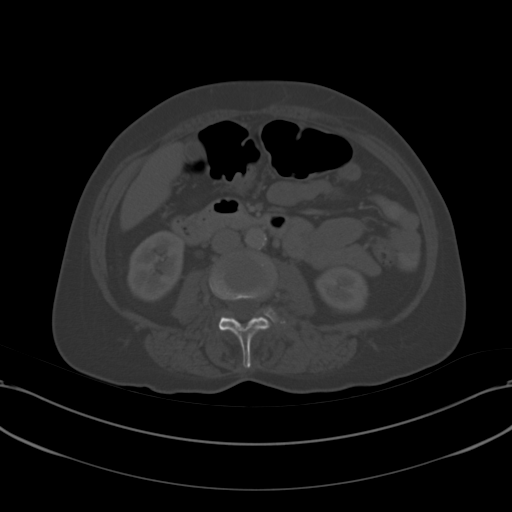
[im 54/81  soft-tissue]
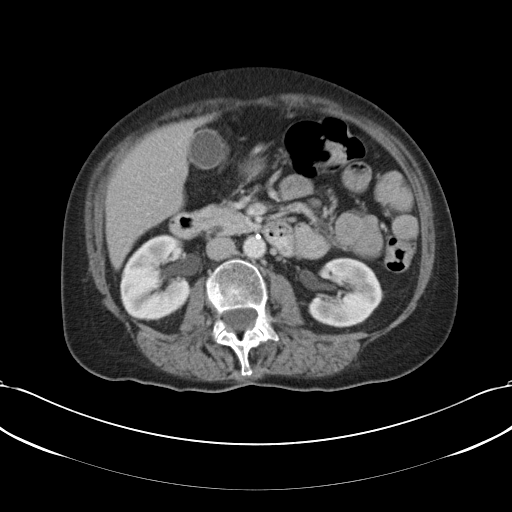
[im 58/81  soft-tissue]
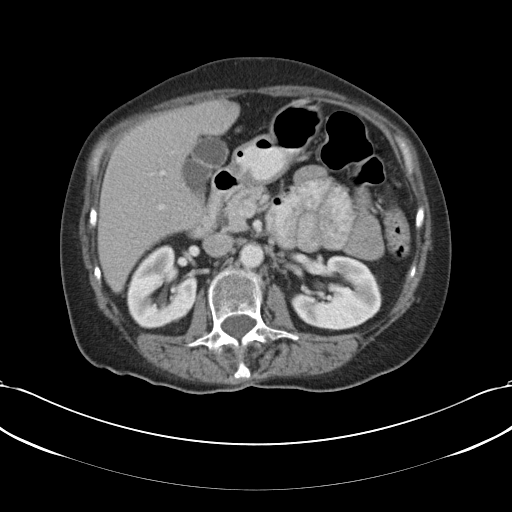
[im 63/81  soft-tissue]
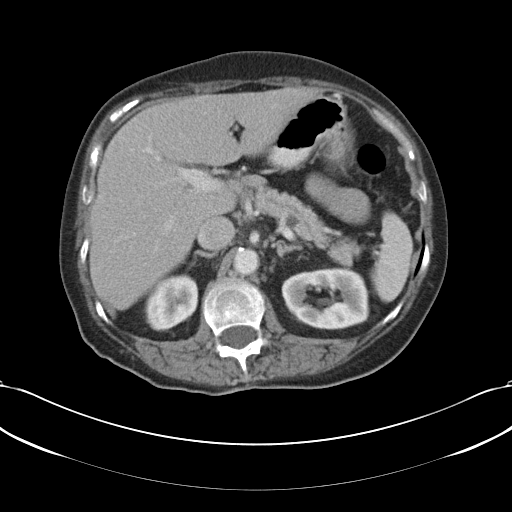
[im 72/81  soft-tissue]
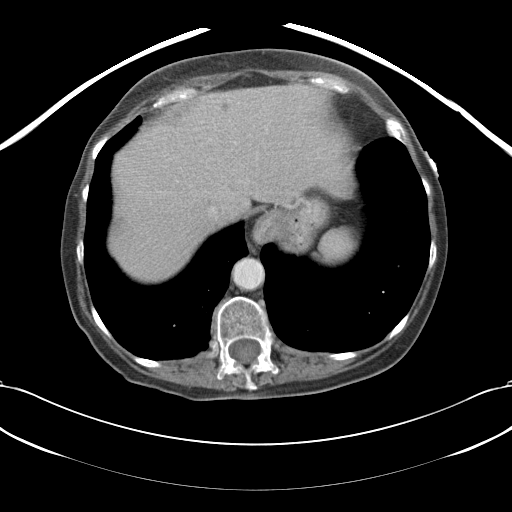
[im 76/81  soft-tissue]
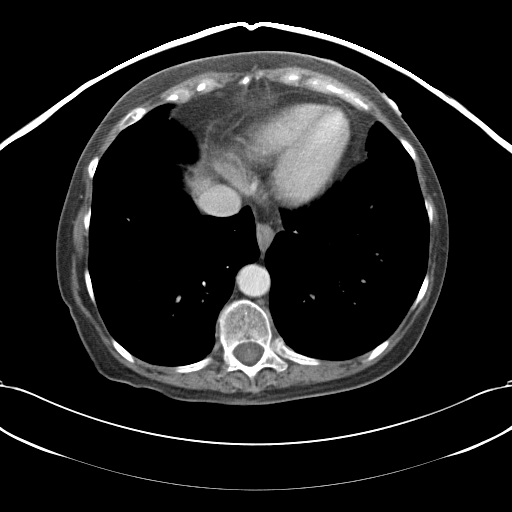

[Series 6: abd/pelv with 2.0 spo cor st · coronal · 0.89mm/px · 3 of 121 slices shown]
[im 41/121  soft-tissue]
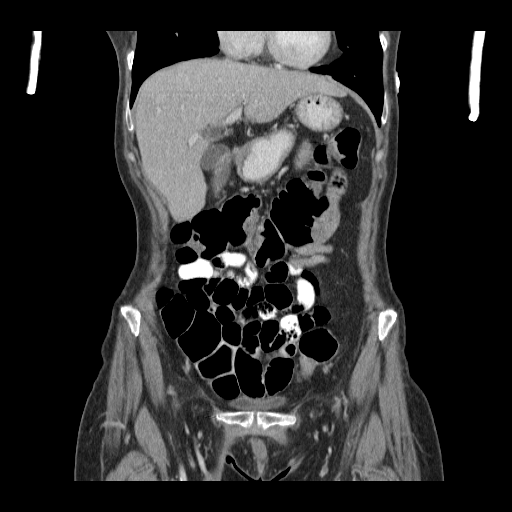
[im 54/121  soft-tissue]
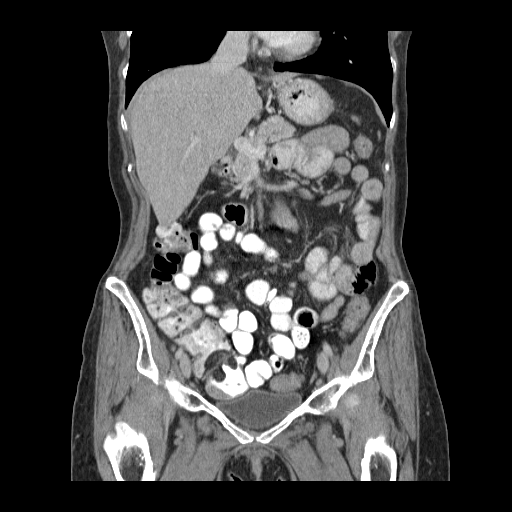
[im 67/121  soft-tissue]
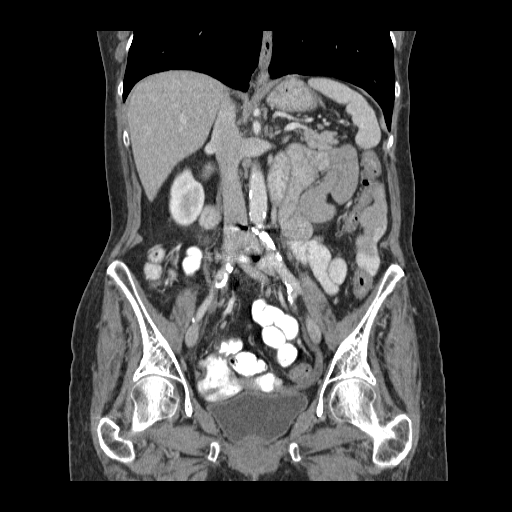

[17 of 46 positions shown; findings below may reference images not displayed]

FINDINGS: Tiny renal and hepatic cysts are stable.  The other
abdominal parenchymal organs are unremarkable appearance.  No
abdominal or pelvic soft tissue masses are identified.  No evidence
of lymphadenopathy.  The uterus and adnexa are unremarkable.  No
evidence of inflammatory process or abnormal fluid collections.  No
evidence of dilated bowel loops or bowel wall thickening.
IMPRESSION: Stable tiny renal and hepatic cysts.  No acute findings.

## 2011-10-12 LAB — DIFFERENTIAL
Basophils Absolute: 0
Basophils Relative: 0
Eosinophils Absolute: 0
Eosinophils Relative: 1
Lymphocytes Relative: 40
Lymphs Abs: 3.8 — ABNORMAL HIGH
Monocytes Absolute: 0.6
Monocytes Relative: 7
Neutro Abs: 4.9
Neutrophils Relative %: 52

## 2011-10-12 LAB — COMPREHENSIVE METABOLIC PANEL
ALT: 18
AST: 25
Albumin: 3.9
Alkaline Phosphatase: 74
BUN: 6
CO2: 26
Calcium: 9.2
Chloride: 106
Creatinine, Ser: 0.6
GFR calc Af Amer: >60
GFR calc non Af Amer: >60
Glucose, Bld: 107 — ABNORMAL HIGH
Potassium: 3.4 — ABNORMAL LOW
Sodium: 139
Total Bilirubin: 0.7
Total Protein: 7

## 2011-10-12 LAB — TSH: TSH: 0.403

## 2011-10-12 LAB — CARDIAC PANEL(CRET KIN+CKTOT+MB+TROPI)
CK, MB: 0.5
Relative Index: INVALID
Total CK: 40
Troponin I: 0.02

## 2011-10-12 LAB — SEDIMENTATION RATE: Sed Rate: 4

## 2011-10-12 LAB — CLOSTRIDIUM DIFFICILE EIA: C difficile Toxins A+B, EIA: NEGATIVE

## 2011-10-12 LAB — APTT: aPTT: 31

## 2011-10-12 LAB — PROTIME-INR
INR: 1
Prothrombin Time: 12.9

## 2011-10-12 LAB — CBC
HCT: 44.8
Hemoglobin: 15.4 — ABNORMAL HIGH
MCHC: 34.4
MCV: 101.9 — ABNORMAL HIGH
Platelets: 297
RBC: 4.39
RDW: 12.9
WBC: 9.4

## 2011-10-12 LAB — B-NATRIURETIC PEPTIDE (CONVERTED LAB): Pro B Natriuretic peptide (BNP): <30

## 2011-10-20 ENCOUNTER — Ambulatory Visit: Payer: 59 | Admitting: Gastroenterology

## 2011-10-26 ENCOUNTER — Ambulatory Visit (INDEPENDENT_AMBULATORY_CARE_PROVIDER_SITE_OTHER): Payer: 59

## 2011-10-26 DIAGNOSIS — D649 Anemia, unspecified: Secondary | ICD-10-CM

## 2011-10-28 DIAGNOSIS — D649 Anemia, unspecified: Secondary | ICD-10-CM

## 2011-10-28 MED ORDER — CYANOCOBALAMIN 1000 MCG/ML IJ SOLN
1000.0000 ug | Freq: Once | INTRAMUSCULAR | Status: AC
  Administered 2011-10-28: 1000 ug via INTRAMUSCULAR

## 2011-11-09 ENCOUNTER — Encounter: Payer: Self-pay | Admitting: Gastroenterology

## 2011-11-09 ENCOUNTER — Ambulatory Visit (INDEPENDENT_AMBULATORY_CARE_PROVIDER_SITE_OTHER): Payer: 59 | Admitting: Gastroenterology

## 2011-11-09 ENCOUNTER — Ambulatory Visit (INDEPENDENT_AMBULATORY_CARE_PROVIDER_SITE_OTHER)
Admission: RE | Admit: 2011-11-09 | Discharge: 2011-11-09 | Disposition: A | Discharge status: Home / Self Care | Payer: 59 | Source: Ambulatory Visit | Attending: Gastroenterology | Admitting: Gastroenterology

## 2011-11-09 DIAGNOSIS — R1013 Epigastric pain: Secondary | ICD-10-CM

## 2011-11-09 DIAGNOSIS — K589 Irritable bowel syndrome without diarrhea: Secondary | ICD-10-CM

## 2011-11-09 DIAGNOSIS — R634 Abnormal weight loss: Secondary | ICD-10-CM

## 2011-11-09 DIAGNOSIS — R109 Unspecified abdominal pain: Secondary | ICD-10-CM

## 2011-11-09 MED ORDER — IOHEXOL 300 MG/ML  SOLN
100.0000 mL | Freq: Once | INTRAMUSCULAR | Status: AC | PRN
  Administered 2011-11-09: 100 mL via INTRAVENOUS

## 2011-11-09 NOTE — Progress Notes (Signed)
[name] has return for followup of her diarrhea and abdominal pain. On Lotronix diarrhea has subsided. By day 3 she feels constipated and holds the medicine at which point the diarrhea occurs rather severely. Left periumbilical pain continues.  On exam there is mild tenderness to palpation left periumbilical area without guarding rebound. There are no abnormal masses or organomegaly

## 2011-11-09 NOTE — Assessment & Plan Note (Addendum)
She has had a good response to Lotronex but seems to overshoot and develop constipation.  Recommendations #1 the patient was instructed to take lotronex  every other day

## 2011-11-09 NOTE — Assessment & Plan Note (Addendum)
Pain is more periumbilical and is probably related to [name]. IBD is a possibility although this has not been seen by prior colonoscopy or CT scan.  Recommendations #1 CT enterography #2 continue hyomax

## 2011-11-09 NOTE — Patient Instructions (Addendum)
You have been given a separate informational sheet regarding your tobacco use, the importance of quitting and local resources to help you quit. Your CT is scheduled at [facility] CT today at 1pm They will give you your contrast to drink when you get there

## 2011-11-10 ENCOUNTER — Telehealth: Payer: Self-pay

## 2011-11-10 IMAGING — CT CT ABD-PELV W/ CM
2 of 5 series · 17 of 46 positions shown, 19 images · IV contrast (Omnipaque 300)
Comparison: 01/19/2010 and earlier.

CLINICAL DATA: 58-year-old female with left lower quadrant pain,
nausea, vomiting, diarrhea, fever.

CT ABDOMEN AND PELVIS WITH CONTRAST
TECHNIQUE: Multidetector CT imaging of the abdomen and pelvis was
performed following the standard protocol during bolus
administration of intravenous contrast.
Contrast: 90 ml Jmnipaque-S33.

[Series 2: abd/ pel · axial · 0.57mm/px · z∈[+740,+1050]mm · 14 of 70 slices shown, 16 images]
[im 4/70  soft-tissue]
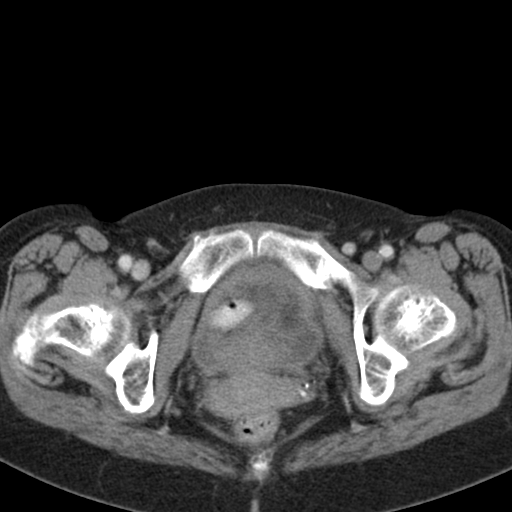
[im 4/70  bone]
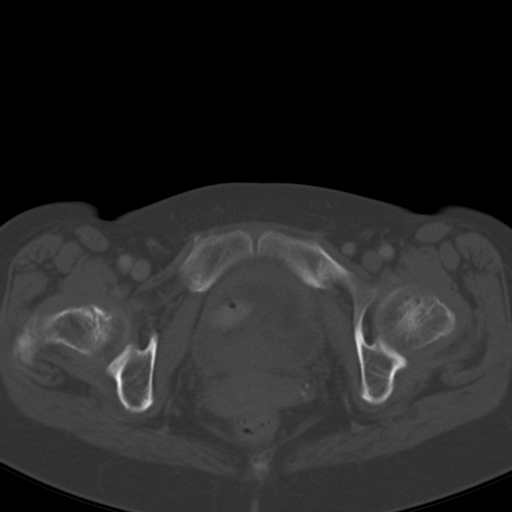
[im 11/70  soft-tissue]
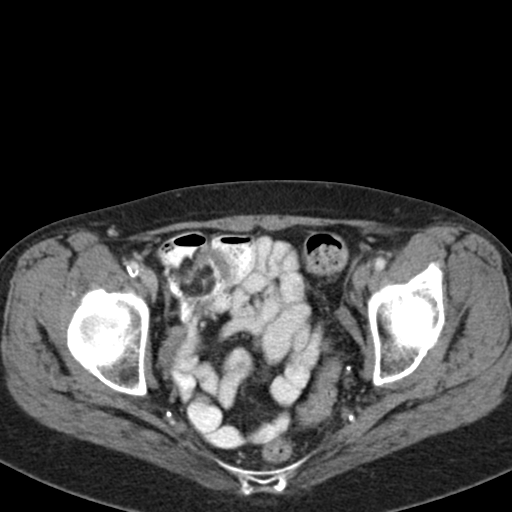
[im 14/70  soft-tissue]
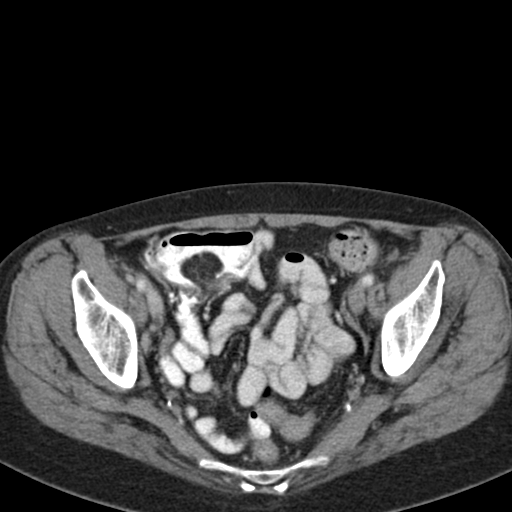
[im 18/70  soft-tissue]
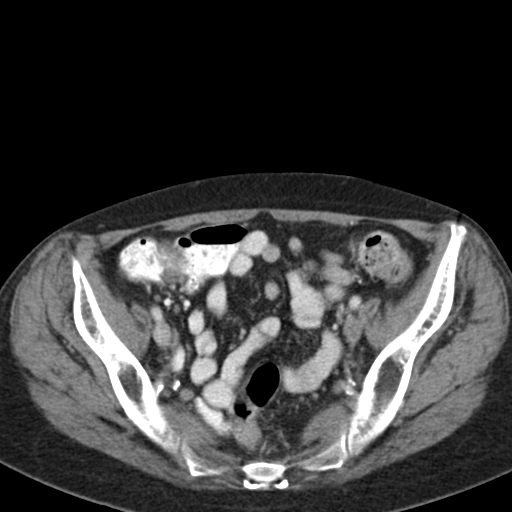
[im 25/70  soft-tissue]
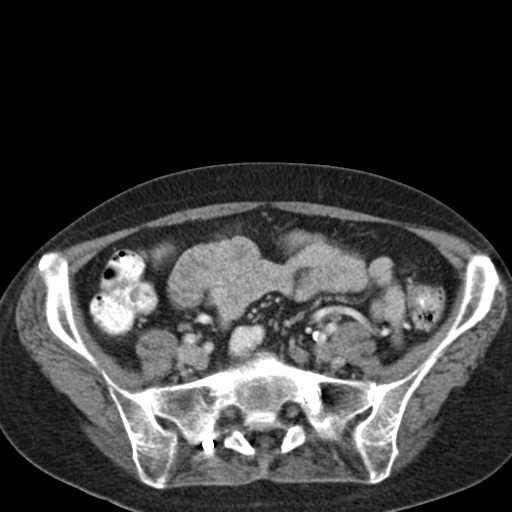
[im 28/70  soft-tissue]
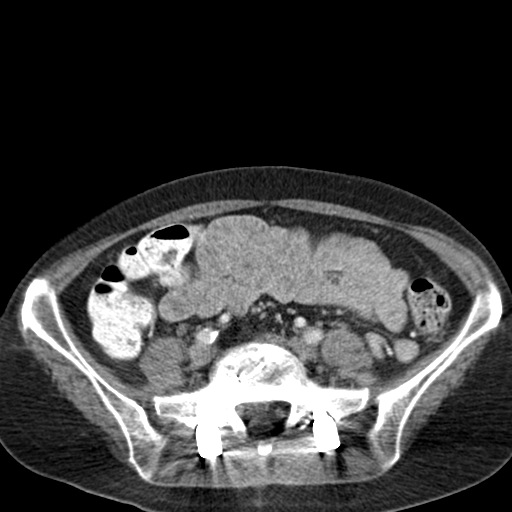
[im 32/70  soft-tissue]
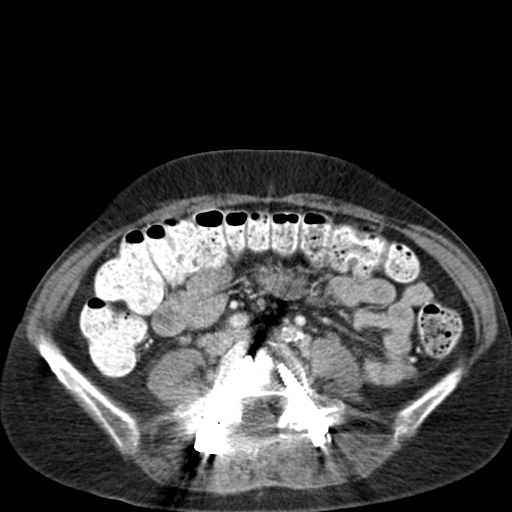
[im 38/70  soft-tissue]
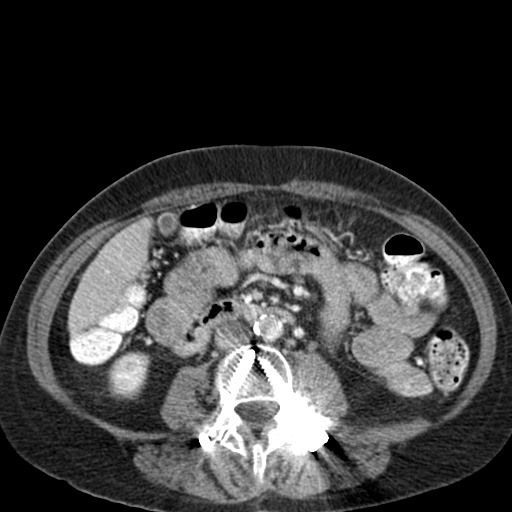
[im 42/70  soft-tissue]
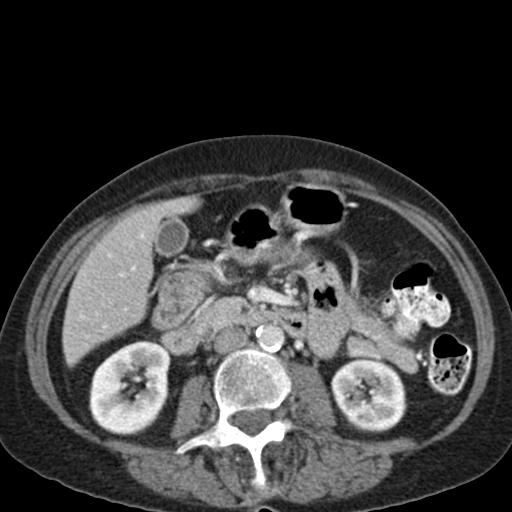
[im 42/70  bone]
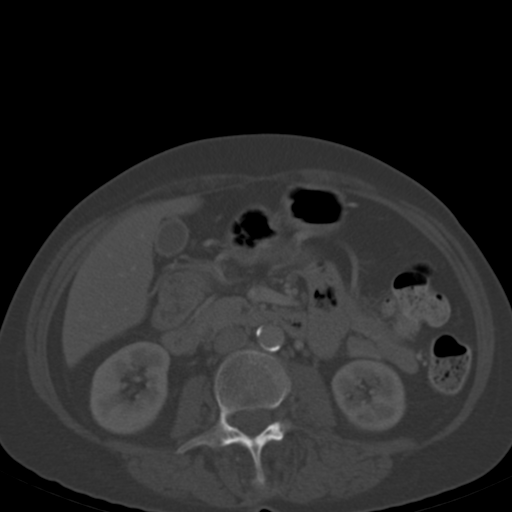
[im 45/70  soft-tissue]
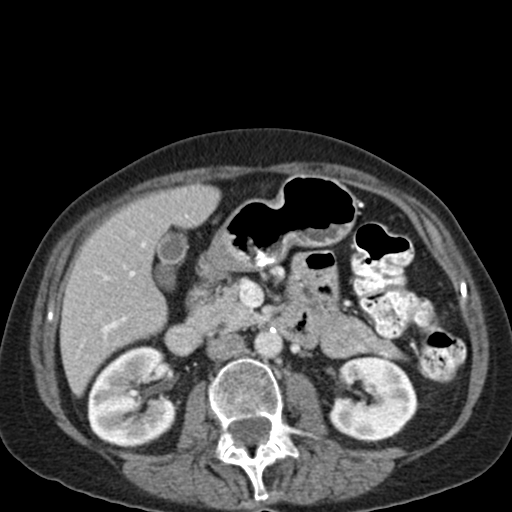
[im 52/70  soft-tissue]
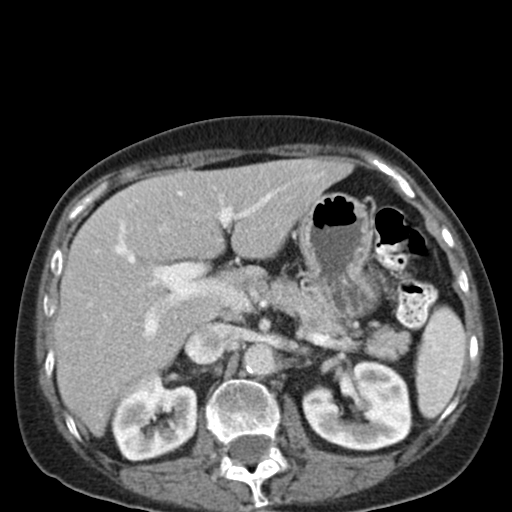
[im 56/70  soft-tissue]
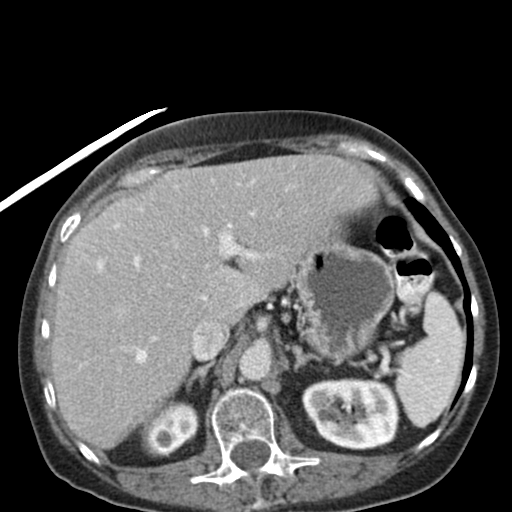
[im 59/70  soft-tissue]
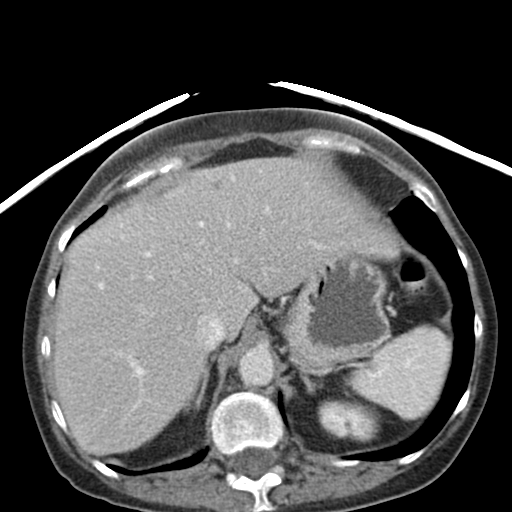
[im 66/70  soft-tissue]
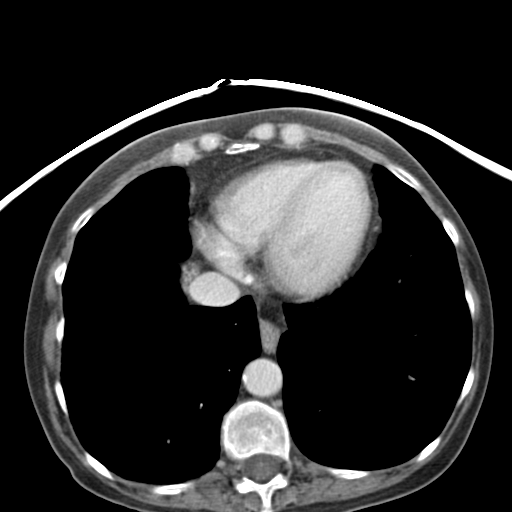

[Series 602: <mpr range> · coronal · 0.72mm/px · 3 of 95 slices shown]
[im 32/95  soft-tissue]
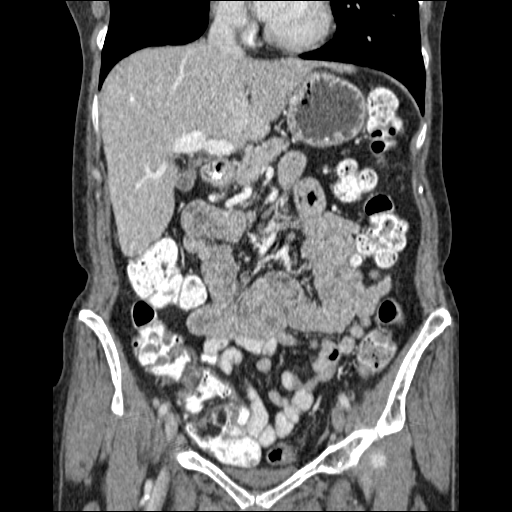
[im 42/95  soft-tissue]
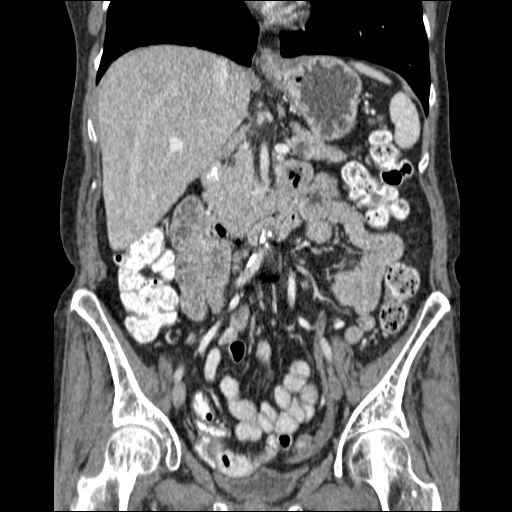
[im 53/95  soft-tissue]
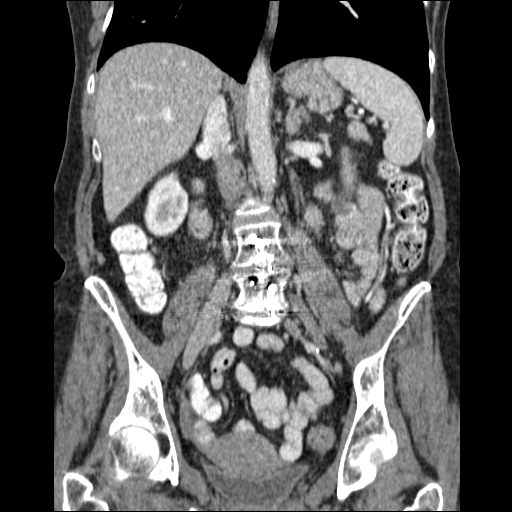

[17 of 46 positions shown; findings below may reference images not displayed]

FINDINGS: Minor dependent atelectasis at the right lung base.
Postoperative changes to the lower lumbar spine and sacrum.
Lucency about the S1 pedicle screws re-identified. No acute osseous
abnormality identified.  Oral contrast has reached the distal
descending colon.  There is some retained stool throughout the
colon.  No colon inflammatory changes.  No diverticular disease.
Normal ileocecal valve.  Normal small bowel loops.  Uterus and
adnexa are within normal limits.  Bladder is decompressed.  No
dilated bowel.  Possible small hiatal hernia.  Stomach and duodenum
are otherwise within normal limits.  Stable benign small left
hepatic cyst.  Liver, gallbladder, spleen, and adrenal glands are
normal.  Stable 8 mm cystic lesion of the ventral pancreatic body
on image 20 (unchanged since 7888).  Pancreas is otherwise normal.
Kidneys are stable with incidental small renal cyst.  Portal venous
system is within normal limits.  Major arterial structures are
normal aside from atherosclerosis.  No free fluid.
IMPRESSION: 1.  No acute or inflammatory findings.  No diverticular disease of
the colon.
2.  Stable benign hepatic, renal, and pancreatic cysts.

## 2011-11-10 NOTE — Progress Notes (Signed)
Quick Note:  Please inform the patient that CT was normal and to continue current plan of action ______ 

## 2011-11-10 NOTE — Telephone Encounter (Signed)
Message copied by Michele Mcalpine on Tue Nov 10, 2011  4:35 PM ------      Message from: Melvia Heaps D      Created: Tue Nov 10, 2011  4:09 PM       Please inform the patient that CT was normal and to continue current plan of action

## 2011-11-10 NOTE — Telephone Encounter (Signed)
Pt aware.

## 2011-11-12 ENCOUNTER — Ambulatory Visit (INDEPENDENT_AMBULATORY_CARE_PROVIDER_SITE_OTHER): Payer: 59

## 2011-11-12 ENCOUNTER — Telehealth: Payer: Self-pay

## 2011-11-12 DIAGNOSIS — E538 Deficiency of other specified B group vitamins: Secondary | ICD-10-CM

## 2011-11-12 NOTE — Telephone Encounter (Signed)
Pt called back regarding ct results. The results were good and pt state she is still having problems with abdominal pain. Pt scheduled to see Dr. [name] 12-01-11@3 :45pm. Pt aware of appt date and time.

## 2011-11-13 ENCOUNTER — Ambulatory Visit (INDEPENDENT_AMBULATORY_CARE_PROVIDER_SITE_OTHER): Payer: 59 | Admitting: Adult Health

## 2011-11-13 ENCOUNTER — Encounter: Payer: Self-pay | Admitting: Adult Health

## 2011-11-13 DIAGNOSIS — F3289 Other specified depressive episodes: Secondary | ICD-10-CM

## 2011-11-13 DIAGNOSIS — IMO0001 Reserved for inherently not codable concepts without codable children: Secondary | ICD-10-CM

## 2011-11-13 DIAGNOSIS — Z23 Encounter for immunization: Secondary | ICD-10-CM

## 2011-11-13 DIAGNOSIS — R030 Elevated blood-pressure reading, without diagnosis of hypertension: Secondary | ICD-10-CM

## 2011-11-13 DIAGNOSIS — F329 Major depressive disorder, single episode, unspecified: Secondary | ICD-10-CM

## 2011-11-13 MED ORDER — DULOXETINE HCL 30 MG PO CPEP: 30.0000 mg | ORAL_CAPSULE | Freq: Every day | ORAL | Status: DC

## 2011-11-13 NOTE — Assessment & Plan Note (Signed)
B/P is elevated - she declines medications  Advised to check b/p log and bring to next ov Low salt diet.  Avoid nsaids, decongestants.  follow up Dr. Kriste Basque  In 6 weeks

## 2011-11-13 NOTE — Patient Instructions (Addendum)
Keep blood pressure log , call if blood pressure if >160-bring log to next ov Low salt diet  Avoid decongestant, NSAIDS-ibuprofen, advil, motrin, aleve follow up Dr. Kriste Basque  In 6 weeks and As needed   Please contact office for sooner follow up if symptoms do not improve or worsen or seek emergency care

## 2011-11-13 NOTE — Progress Notes (Signed)
Addended by: Ozella Almond R on: 11/13/2011 11:35 AM   Modules accepted: Orders

## 2011-11-13 NOTE — Progress Notes (Signed)
Subjective:    Patient ID: Denise King, female    DOB: 1951-04-10, 60 y.o.   MRN: [id number]  HPI 60 yo female   ~ August 06, 2009: had back surg w/ fusion by [facility]Cohen at [facility] in Oct09... much difficulty after surg- out of work til 4/10 (ret part time) and 8/10 (just ret full time)... she is seeing DrPhillips for pain management now on EMBEDA 50-2 Bid + MSIR for breakthrough pain...   ~ February 08, 2010: recent GI bug & [name] flair w/ left flank pain- stool studies neg & Rx w/ Levsin, empiric Flagyl, Florastor, & Lomotil... she continues regular f/u in pain clinic w/ DrPhillips- now on OPANA & Cymbalta... f/u labs today.   ~ February 03, 2011: 1 yr ROV & she reports lots of GI problems- managed by DrKaplan; and chronic back pain- managed by DrPhillips on OPANA ER 20mg Bid & PERCOCET10- 4 per day...  She has GERD, [name], Divertics, & pos FamHx colon ca in brother; chr abd pain, bloating, etc w/ mult tests> CT scan, Colonoscopy, Labs, medication trials, etc... on going Rx per DrKaplan.  She continues to smoke 1ppd- CXR w/ chr changes, NAD; denies much cough, sputum, SOB but sedentary due to her back; offered smoking cessation help but she declines...  Osteoporosis eval & Rx by her GYN, DrHolland- treated w/ Reclast by her hx (we don't have records).  She has known B12 defic & she was on monthly B12 shots but for some reason she stopped> f/u B12 level is low at 213 today & she will re-start B12 shots monthly...  04/13/2011 Acute OV -walk in  Presents today for acute ov , complains  Fever (subjective) , chest congestion, prod cough clear mucus, runny nose, wheezing,  increased SOB x6days. Seen at Urgent care on 4/13 -given zpack. reports no better with abx. She is on day 4 of 5 of zpack.  She is still smoking. Has not started Chantix starter pack yet. Taking hydrocodone cough syrup without much help.  >>  11/13/2011 Acute OV Complains that blood pressure has been elevated ~150/100 over last  couple of weeks.  Does not feel that good. No decongestants, no NSAIDS, no new meds.  Denies headache , visual/speech changes, leg edema or dyspnea.  Continues at pain clinic with minimal pain relief, still trying to work. Does not want to file  For disability .  We discussed smoking cesstation.    Review of Systems  Constitutional:   No  weight loss, night sweats,     HEENT:   No headaches,  Difficulty swallowing,  Tooth/dental problems, or  Sore throat,               No  sneezing, itching, ear ache, nasal congestion, post nasal drip,   CV:  No chest pain,  Orthopnea, PND, swelling in lower extremities, anasarca, dizziness, palpitations, syncope.   GI  No heartburn, indigestion, abdominal pain, nausea, vomiting, diarrhea, change in bowel habits, loss of appetite, bloody stools.   Resp  No coughing up of blood.  No change in color of mucus.  No wheezing.  No chest wall deformity  Skin: no rash or lesions.  GU: no dysuria, change in color of urine, no urgency or frequency.  No flank pain, no hematuria   MS:  No joint pain or swelling.    Psych:  No change in mood or affect. No depression or anxiety.  No memory loss.   Objective:   Physical Exam  GEN: A/Ox3; pleasant , NAD, well nourished   HEENT:  [place]/AT,  EACs-clear, TMs-wnl, NOSE-clear drainage , THROAT-clear, no lesions, no postnasal drip or exudate noted.   NECK:  Supple w/ fair ROM; no JVD; normal carotid impulses w/o bruits; no thyromegaly or nodules palpated; no lymphadenopathy.  RESP  Coarse BS with few exp wheezes   CARD:  RRR, no m/r/g  , no peripheral edema, pulses intact, no cyanosis or clubbing.  GI:   Soft & nt; nml bowel sounds; no organomegaly or masses detected.  Musco: Warm bil, no deformities or joint swelling noted.   Neuro: alert, no focal deficits noted.  CN 2-12 intact   Skin: Warm, no lesions or rashes          Assessment & Plan:

## 2011-11-13 NOTE — Assessment & Plan Note (Signed)
Stress reducers encouraged Smoking cesstation discussed

## 2011-11-17 DIAGNOSIS — E538 Deficiency of other specified B group vitamins: Secondary | ICD-10-CM

## 2011-11-17 MED ORDER — CYANOCOBALAMIN 1000 MCG/ML IJ SOLN
1000.0000 ug | Freq: Once | INTRAMUSCULAR | Status: AC
  Administered 2011-11-17: 1000 ug via INTRAMUSCULAR

## 2011-12-01 ENCOUNTER — Encounter: Payer: Self-pay | Admitting: Gastroenterology

## 2011-12-01 ENCOUNTER — Ambulatory Visit (INDEPENDENT_AMBULATORY_CARE_PROVIDER_SITE_OTHER): Payer: 59 | Admitting: Gastroenterology

## 2011-12-01 VITALS — BP 138/80 | HR 94 | Ht 64.0 in | Wt 129.2 lb

## 2011-12-01 DIAGNOSIS — K589 Irritable bowel syndrome without diarrhea: Secondary | ICD-10-CM

## 2011-12-01 NOTE — Progress Notes (Signed)
History of Present Illness:  Denise King has returned for followup of [name]. Recent CT enterography was unremarkable. She actually is feeling well and is not having either abdominal pain or diarrhea. She is not taking Lotronix. She claims that this is typical for her course where she may have asymptomatic periods    Review of Systems: Pertinent positive and negative review of systems were noted in the above HPI section. All other review of systems were otherwise negative.    Current Medications, Allergies, Past Medical History, Past Surgical History, Family History and Social History were reviewed in [employer] electronic medical record  Vital signs were reviewed in today's medical record. Physical Exam: General: Well developed , well nourished, no acute distress

## 2011-12-01 NOTE — Patient Instructions (Signed)
Call back as needed 

## 2011-12-01 NOTE — Assessment & Plan Note (Signed)
She currently is in clinical remission. There is no evidence for underlying inflammatory bowel disease.  Recommendations #1 I instructed her to resume Lotronex as needed if diarrhea occurs. She should also resume her anticholinergics for abdominal pain if needed.

## 2012-01-01 ENCOUNTER — Ambulatory Visit (INDEPENDENT_AMBULATORY_CARE_PROVIDER_SITE_OTHER): Payer: 59 | Admitting: Pulmonary Disease

## 2012-01-01 ENCOUNTER — Encounter: Payer: Self-pay | Admitting: Pulmonary Disease

## 2012-01-01 DIAGNOSIS — D126 Benign neoplasm of colon, unspecified: Secondary | ICD-10-CM

## 2012-01-01 DIAGNOSIS — G894 Chronic pain syndrome: Secondary | ICD-10-CM

## 2012-01-01 DIAGNOSIS — K589 Irritable bowel syndrome without diarrhea: Secondary | ICD-10-CM

## 2012-01-01 DIAGNOSIS — I872 Venous insufficiency (chronic) (peripheral): Secondary | ICD-10-CM

## 2012-01-01 DIAGNOSIS — K5730 Diverticulosis of large intestine without perforation or abscess without bleeding: Secondary | ICD-10-CM

## 2012-01-01 DIAGNOSIS — J42 Unspecified chronic bronchitis: Secondary | ICD-10-CM

## 2012-01-01 DIAGNOSIS — M899 Disorder of bone, unspecified: Secondary | ICD-10-CM

## 2012-01-01 DIAGNOSIS — F341 Dysthymic disorder: Secondary | ICD-10-CM

## 2012-01-01 DIAGNOSIS — M949 Disorder of cartilage, unspecified: Secondary | ICD-10-CM

## 2012-01-01 DIAGNOSIS — M549 Dorsalgia, unspecified: Secondary | ICD-10-CM

## 2012-01-01 DIAGNOSIS — E538 Deficiency of other specified B group vitamins: Secondary | ICD-10-CM

## 2012-01-01 DIAGNOSIS — K219 Gastro-esophageal reflux disease without esophagitis: Secondary | ICD-10-CM

## 2012-01-01 MED ORDER — METOPROLOL TARTRATE 25 MG PO TABS: 25.0000 mg | ORAL_TABLET | Freq: Two times a day (BID) | ORAL | Status: DC

## 2012-01-01 MED ORDER — DULOXETINE HCL 60 MG PO CPEP: 60.0000 mg | ORAL_CAPSULE | Freq: Every day | ORAL | Status: DC

## 2012-01-01 MED ORDER — ONDANSETRON HCL 4 MG PO TABS: 4.0000 mg | ORAL_TABLET | ORAL | Status: DC | PRN

## 2012-01-01 NOTE — Patient Instructions (Addendum)
Today we updated your med list in our EPIC system...    Continue your current medications the same...    We refilled the meds you requested...  We decided to start METOPROLOL ER 25mg  one tab daily for BP 7 your pulse rate...    Continue to monitor your BP at home & call for questions...  Today we did your follow up CXR & reviewed your recent blood work...    Please call the PHONE TREE in a few days for your results...    Dial [id number] & when prompted enter your patient number followed by the # symbol...    Your patient number is:  [id number]#  Let me know if we can be of assistance in any way.Marland KitchenMarland Kitchen

## 2012-01-03 ENCOUNTER — Encounter: Payer: Self-pay | Admitting: Pulmonary Disease

## 2012-01-03 NOTE — Progress Notes (Signed)
Subjective:     Patient ID: Denise King, female   DOB: Nov 12, 1951, 62 y.o.   MRN: [id number]  HPI 61 y/o WF here for a follow up visit... she has multiple medical problems as noted below...   ~  August 06, 2009:  had back surg w/ fusion by DrCohen at [facility] hosp in Oct09... much difficulty after surg- out of work til 4/10 (ret part time) and 8/10 (just ret full time)... she is seeing DrPhillips for pain management now on EMBEDA 50-2 Bid + MSIR for breakthrough pain...  ~  February 08, 2010:  recent GI bug & [name] flair w/ left flank pain- stool studies neg & Rx w/ Levsin, empiric Flagyl, Florastor, & Lomotil...  she continues regular f/u in pain clinic w/ DrPhillips- now on OPANA & Cymbalta...  ~  February 03, 2011:  1 yr ROV & she reports lots of GI problems- managed by DrKaplan; and chronic back pain- managed by DrPhillips on OPANA ER 20mg Bid & PERCOCET10- 4 per day...     She has GERD, [name], Divertics, & pos FamHx colon ca in brother;  chr abd pain, bloating, etc w/ mult tests> CT scan, Colonoscopy, Labs, medication trials, etc...    She continues to smoke 1ppd- CXR w/ chr changes, NAD;  denies much cough, sputum, SOB but sedentary due to her back; declines smoking cessation help...    Osteoporosis eval & Rx by her GYN, DrHolland- treated w/ Reclast by her hx (we don't have records)...    She has known B12 defic & she was on monthly B12 shots but for some reason she stopped> f/u B12 level is low at 213 today & she will re-start B12 shots monthly...  ~  January 01, 2012:  5mo ROV & it's been a rough year for [facility] w/ problems in several areas as reviewed below;  She also notes incr BP recently but she believes due to her incr pain level, and notes her pulse is always rapid> we discussed trial BBlocker for this w/ TOPROL XL 25mg /d to start as she is scared of side effects of medications...    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    HBP> not currently on meds & she thinks hi readings at home are due to her pain; BP= 126/90 & pulse= 94 (readings higher at home); we decided on trial of low dose BBlocker ToprolXL 25mg /d as noted above...    GI> managed by DrKaplan & seen every 2-47mo, she is c/o constant pain & diarrhea; now on Lotronex which seems to help the diarrhea but not her pain...    Chr Pain> Hx severe back problems- DrCohen, & pain managed by DrPhillips on Opana20Bid & Percocet10- now taking 5/d; "they can't go higher & pain is getting worse"...    Nerves? Insomnia> DrKaplan started Xanax 1mg Qhs & she adds OTC Unisom25mg Qhs; we have filled her Cymbalta 60mg Qhs          Problem List:  VERTIGO (ICD-780.4) - Hosp 8/08 w/ neg eval and eventually referred to Neurology w/ nothing found; Rx'd w/ Antivert, Phenergan, vestib therapy...  BRONCHITIS, RECURRENT (ICD-491.9) - 1ppd smoker w/ hx recurrent bronchitis... she refuses smoking cessation counseling, Chantix, etc... notes min cough, sputum & SOB- sedentary due to back prob...  VENOUS INSUFFICIENCY (ICD-459.81) - she knows to avoid sodium, elevate legs, wear support hose... has LASIX 20mg  Prn  w/ KCl .  REFLUX ESOPHAGITIS (ICD-530.11) - EGD 9/05 by DrPerry showed mod severe reflux esoph w/ erosions,  Rx PROTONIX 40mg /d regularly w/ good control, but states she needs it Bid;  She uses ZOFRAN 4mg  as needed for nausea.  DIVERTICULOSIS OF COLON (ICD-562.10), &  [name] (ICD-564.1) - she takes LEVBID Bid as needed vs LIBRAX Tid Prn... ~  extensive GI eval by DrKaplan 1/10 for abd pain, [name], etc... neg EGD, neg CTAbd, neg Emptying scan... believed secondary to narcotic analgesics... ~  on-going GI eval by DrKaplan w/ repeat CT Abd 2/11 (stable benign cysts liver/ pancreas/ renal), Colonoscopy 1/12 (sigm divertics & neg biopsies) etc... ~  Continued GI evaluation by DrKaplan in 2012 w/ ER visit 8/12 for [name] w/ N/ V; & Labs/ CT  enterography, etc;  LOTRONEX helps her diarrhea + Probiotics.  COLONIC POLYPS (ICD-211.3) - prev colonoscopy 2/06 by [name] showed Divertic, otherw neg; f/u 39yrs w/ +fam hx colon ca. ~  as above> f/u colon 1/12 w/ divertics otherw neg.  CALCULUS, KIDNEY (ICD-592.0) - Hx of Kidney Stones & small renal cysts on CT scan & sonar - followed by DrWrenn.  BACK PAIN, LUMBAR (ICD-724.2) - initial eval and rx by [facility] w/ DDD and no surg possible... she's had series of epidural shots from DrStrong without relief... additional opinion from [facility]- surg not recommended... referred to a chronic pain clinic... ~  fall w/ injury 2009 & further eval from DrCohen- she decided to proceed w/ spinal surgery & fusion by DrCohen 10/09 at [facility] hosp... much difficulty post op & now followed in the pain management clinic by DrPhillips. ~  she reports another opinion from DrBranch at [facility]- "he is considering additional surg"...  CHRONIC PAIN SYNDROME (ICD-338.4) - followed by drPhillips pain clinic... currently on OPANA ER 20mg  Bid & PERCOCET10- up to 5 daily... also on CYMBALTA 60mg /d...  OSTEOPENIA (ICD-733.90) - she was prev on EVISTA, Ca++, Vits (w/ BMD per GYN)... she stopped the Evista due to nausea which she feels is better off this med... f/u by [facility] & pt reports osteoporosis w/ RECLAST infusion... ~  labs 3/09 showed Vit d level = 12... started on Vit D 50000 u weekly. ~  labs 6/10 showed Vit D level = 49... OK to switch to 1000 u OTC daily. ~  f/u GYN eval and BMD at [facility] office> she reports Rx w/ RECLAST for osteoposis (we don't have records).  HEADACHE (ICD-784.0)  ANXIETY (ICD-300.00) - prev on Lexapro & Xanax, but these were stopped in favor of CYMBALTA 60mg /d; prev on Lunesta but DrKaplan switched her to [facility] 1mg Qhs & she added Unisom 25mg  OTC...  VITAMIN B12 DEFICIENCY (ICD-266.2) -  ~  labs 1010 showed Vit B12 level = 118 & started on B 12 shots monthly...  ~  labs 2/11 showed Vit  B12 level = >1500... for some reason she stopped the B12 shots in 2011. ~  labs 2/12 showed Vit B12 level = 213... rec to resume B12 shots monthly. ~  Labs 8/12 by DrKaplan showed Vit B12 level = 517, Folate = 6.1  HEALTH MAINTENANCE:  GYN= DrHolland & PAP, Mammogram, etc... all up-to-date; she is due a BMD in 1/10; and colon 10/11...   Past Surgical History  Procedure Date  . Appendectomy   . Tubal ligation   . Back surgery 10-09    Dr. Noel Gerold  . Tonsillectomy     Outpatient Encounter Prescriptions as of 01/01/2012  Medication Sig Dispense Refill  .  alosetron (LOTRONEX) 1 MG tablet 1 tab by mouth twice daily every other day       . bifidobacterium infantis (ALIGN) capsule Take 1 capsule by mouth daily.  28 capsule  0  . Cholecalciferol (VITAMIN D3) 1000 UNITS tablet Take 1,000 Units by mouth daily.        . Doxylamine Succinate, Sleep, (UNISOM) 25 MG tablet Take 25 mg by mouth at bedtime as needed.        . DULoxetine (CYMBALTA) 60 MG capsule Take 1 capsule (60 mg total) by mouth daily.  30 capsule  11  . furosemide (LASIX) 20 MG tablet Take 20 mg by mouth daily as needed.       . hyoscyamine (LEVSIN SL) 0.125 MG SL tablet Place 0.125 mg under the tongue every 4 (four) hours as needed.        . Multiple Vitamins-Minerals (MULTIVITAMIN WITH MINERALS) tablet Take 1 tablet by mouth daily.        . ondansetron (ZOFRAN) 4 MG tablet Take 1 tablet (4 mg total) by mouth every 4 (four) hours as needed.  30 tablet  5  . oxyCODONE-acetaminophen (PERCOCET) 10-325 MG per tablet Take 1 tablet by mouth up to 5 times daily as directed by Dr. Vear Clock      . oxymorphone (OPANA ER) 20 MG 12 hr tablet Take 20 mg by mouth every 12 (twelve) hours. As directed by Dr. Vear Clock.       . pantoprazole (PROTONIX) 40 MG tablet Take 40 mg by mouth daily as needed.       . potassium chloride (KLOR-CON) 10 MEQ CR tablet Take 10 mEq by mouth daily as needed. Take with lasix      . XANAX 0.5 MG tablet TAKE 1/2-1 TABLET  3 TIMES A DAY AS NEEDED FOR ACUTE ANXIETY.  90 each  5  . zoledronic acid (RECLAST) 5 MG/100ML SOLN Inject 5 mg into the vein once. yearly       . DISCONTD: DULoxetine (CYMBALTA) 60 MG capsule Take 60 mg by mouth daily.        Marland Kitchen DISCONTD: ondansetron (ZOFRAN) 4 MG tablet Take 4 mg by mouth every 4 (four) hours as needed.        . metoprolol tartrate (LOPRESSOR) 25 MG tablet Take 1 tablet (25 mg total) by mouth 2 (two) times daily.  30 tablet  6    Allergies  Allergen Reactions  . Atorvastatin     REACTION: pt states "nausea \\T \ vomiting"  . Levaquin     Reaction: achilles tendon pain  . Sulfonamide Derivatives     REACTION: pt states "rash"  . Trazodone And Nefazodone     Current Medications, Allergies, Past Medical History, Past Surgical History, Family History, and Social History were reviewed in [employer] record.    Review of Systems        See HPI - all other systems neg except as noted...  The patient complains of dyspnea on exertion, headaches, abdominal pain, and depression.  The patient denies anorexia, fever, weight loss, weight gain, vision loss, decreased hearing, hoarseness, chest pain, syncope, peripheral edema, prolonged cough, hemoptysis, melena, hematochezia, severe indigestion/heartburn, hematuria, incontinence, muscle weakness, suspicious skin lesions, transient blindness, difficulty walking, unusual weight change, abnormal bleeding, enlarged lymph nodes, and angioedema.     Objective:   Physical Exam     WD, WN, 61 y/o WF in NAD... GENERAL:  Alert & oriented; pleasant & cooperative... HEENT:  [place]/AT,  EACs-clear,  TMs-wnl, NOSE-clear, THROAT-clear & wnl. NECK:  Supple w/ fairROM; no JVD; normal carotid impulses w/o bruits; no thyromegaly or nodules palpated; no lymphadenopathy. CHEST:  Clear to P & A; without wheezes/ rales/ or rhonchi heard... HEART:  Regular Rhythm; without murmurs/ rubs/ or gallops detected... ABDOMEN:  Soft &  nontender; normal bowel sounds; no organomegaly or masses palpated... EXT: without deformities, mild arthritic changes; no varicose veins/ +venous insuffic/ no edema.,  limp w/ walking.  BACK:  c/o mod severe back pain... NEURO:  no focal neuro deficits... DERM:  no lesions seen...  RADIOLOGY DATA:  Reviewed in the EPIC EMR & discussed w/ the patient...    >>Last CXR 2/12 showed underlying COPD w/ sl incr markings, NAD...    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  LABORATORY DATA:  Reviewed in the EPIC EMR & discussed w/ the patient...    >>LABS from 8/12 avail in EPIC & reviewed w/ the pt...   Assessment:     Smoker, Chr Bronchitis>  Must quit all smoking but she refuses smoking cessation help; doesn't feel her breathing is bad enough for chronic meds/ inhalers/ etc...  HBP>  New DX w/ BPs elev at home & borderline here, but also has tachycardia & she relates most of this to her chr pain despite pain meds; REC to start trial TOPROL XL 25mg /d 7 monitor BP & pulse at home...  Ven Insuffic>  On low sodium diet & has Lasix20 + K10 to use as needed...  GI> GERD, Divertics, [name], +FamHx colon ca>  On Protonix, Levsin vs Librax, & DrKaplan added Lotronex w/ some improvement...  Hx Kidney Stones>  Followed by DrWrenn...  DJD, LBP>  Followed by [facility] & DrCohen who did surg in [place] 10/09 (she has also seen DrNudelman & DrBranch at [facility])...  Chronic Pain Syndrome>  Managed by DrPhillips pain clinic on meds as noted...  Anxiety & Insomnia>  On meds listed...  Vit B12 Defic>  On B12 shots monthly...     Plan:     Patient's Medications  New Prescriptions   METOPROLOL TARTRATE (LOPRESSOR) 25 MG TABLET    Take 1 tablet (25 mg total) by mouth 2 (two) times daily.  Previous Medications   ALOSETRON (LOTRONEX) 1 MG TABLET    1 tab by mouth twice daily every other day    BIFIDOBACTERIUM INFANTIS (ALIGN) CAPSULE    Take 1 capsule by mouth daily.    CHOLECALCIFEROL (VITAMIN D3) 1000 UNITS TABLET    Take 1,000 Units by mouth daily.     DOXYLAMINE SUCCINATE, SLEEP, (UNISOM) 25 MG TABLET    Take 25 mg by mouth at bedtime as needed.     FUROSEMIDE (LASIX) 20 MG TABLET    Take 20 mg by mouth daily as needed.    HYOSCYAMINE (LEVSIN SL) 0.125 MG SL TABLET    Place 0.125 mg under the tongue every 4 (four) hours as needed.     MULTIPLE VITAMINS-MINERALS (MULTIVITAMIN WITH MINERALS) TABLET    Take 1 tablet by mouth daily.     OXYCODONE-ACETAMINOPHEN (PERCOCET) 10-325 MG PER TABLET    Take 1 tablet by mouth up to 5 times daily as directed by Dr. Vear Clock   OXYMORPHONE [facility]) 20 MG 12 HR TABLET    Take 20 mg by mouth every 12 (twelve) hours. As directed by Dr. Vear Clock.    PANTOPRAZOLE (PROTONIX) 40 MG TABLET    Take 40 mg by mouth daily  as needed.    POTASSIUM CHLORIDE (KLOR-CON) 10 MEQ CR TABLET    Take 10 mEq by mouth daily as needed. Take with lasix   XANAX 0.5 MG TABLET    TAKE 1/2-1 TABLET 3 TIMES A DAY AS NEEDED FOR ACUTE ANXIETY.   ZOLEDRONIC ACID (RECLAST) 5 MG/100ML SOLN    Inject 5 mg into the vein once. yearly   Modified Medications   Modified Medication Previous Medication   DULOXETINE (CYMBALTA) 60 MG CAPSULE DULoxetine (CYMBALTA) 60 MG capsule      Take 1 capsule (60 mg total) by mouth daily.    Take 60 mg by mouth daily.     ONDANSETRON (ZOFRAN) 4 MG TABLET ondansetron (ZOFRAN) 4 MG tablet      Take 1 tablet (4 mg total) by mouth every 4 (four) hours as needed.    Take 4 mg by mouth every 4 (four) hours as needed.    Discontinued Medications   No medications on file

## 2012-01-06 ENCOUNTER — Ambulatory Visit (INDEPENDENT_AMBULATORY_CARE_PROVIDER_SITE_OTHER): Payer: 59

## 2012-01-06 DIAGNOSIS — D649 Anemia, unspecified: Secondary | ICD-10-CM

## 2012-01-06 MED ORDER — CYANOCOBALAMIN 1000 MCG/ML IJ SOLN
1000.0000 ug | Freq: Once | INTRAMUSCULAR | Status: AC
  Administered 2012-01-06: 1000 ug via INTRAMUSCULAR

## 2012-03-04 ENCOUNTER — Ambulatory Visit (INDEPENDENT_AMBULATORY_CARE_PROVIDER_SITE_OTHER): Payer: 59

## 2012-03-04 DIAGNOSIS — D649 Anemia, unspecified: Secondary | ICD-10-CM

## 2012-03-08 DIAGNOSIS — D649 Anemia, unspecified: Secondary | ICD-10-CM

## 2012-03-08 MED ORDER — CYANOCOBALAMIN 1000 MCG/ML IJ SOLN
1000.0000 ug | Freq: Once | INTRAMUSCULAR | Status: AC
  Administered 2012-03-08: 1000 ug via INTRAMUSCULAR

## 2012-03-16 ENCOUNTER — Other Ambulatory Visit (HOSPITAL_COMMUNITY): Payer: Self-pay | Admitting: *Deleted

## 2012-03-18 ENCOUNTER — Other Ambulatory Visit: Payer: Self-pay | Admitting: Pulmonary Disease

## 2012-03-21 ENCOUNTER — Inpatient Hospital Stay (HOSPITAL_COMMUNITY): Admission: RE | Admit: 2012-03-21 | Payer: 59 | Source: Ambulatory Visit

## 2012-03-23 ENCOUNTER — Ambulatory Visit (INDEPENDENT_AMBULATORY_CARE_PROVIDER_SITE_OTHER): Payer: 59 | Admitting: Emergency Medicine

## 2012-03-23 VITALS — BP 167/87 | HR 73 | Temp 98.0°F | Resp 18 | Ht 64.0 in | Wt 129.0 lb

## 2012-03-23 DIAGNOSIS — M549 Dorsalgia, unspecified: Secondary | ICD-10-CM

## 2012-03-23 DIAGNOSIS — K5732 Diverticulitis of large intestine without perforation or abscess without bleeding: Secondary | ICD-10-CM

## 2012-03-23 DIAGNOSIS — K5792 Diverticulitis of intestine, part unspecified, without perforation or abscess without bleeding: Secondary | ICD-10-CM

## 2012-03-23 DIAGNOSIS — D649 Anemia, unspecified: Secondary | ICD-10-CM

## 2012-03-23 DIAGNOSIS — R109 Unspecified abdominal pain: Secondary | ICD-10-CM

## 2012-03-23 LAB — POCT CBC
Granulocyte percent: 48.2 %G (ref 37–80)
HCT, POC: 40.8 % (ref 37.7–47.9)
Hemoglobin: 13.1 g/dL (ref 12.2–16.2)
Lymph, poc: 4.9 — AB (ref 0.6–3.4)
MCH, POC: 32.9 pg — AB (ref 27–31.2)
MCHC: 32.1 g/dL (ref 31.8–35.4)
MCV: 102.6 fL — AB (ref 80–97)
MID (cbc): 0.6 (ref 0–0.9)
MPV: 7.4 fL (ref 0–99.8)
POC Granulocyte: 5.1 (ref 2–6.9)
POC LYMPH PERCENT: 46.4 %L (ref 10–50)
POC MID %: 5.4 %M (ref 0–12)
Platelet Count, POC: 386 10*3/uL (ref 142–424)
RBC: 3.98 M/uL — AB (ref 4.04–5.48)
RDW, POC: 13.4 %
WBC: 10.6 10*3/uL — AB (ref 4.6–10.2)

## 2012-03-23 LAB — POCT URINALYSIS DIPSTICK
Bilirubin, UA: NEGATIVE
Glucose, UA: NEGATIVE
Ketones, UA: NEGATIVE
Leukocytes, UA: NEGATIVE
Nitrite, UA: NEGATIVE
Protein, UA: NEGATIVE
Spec Grav, UA: <=1.005
Urobilinogen, UA: 0.2
pH, UA: 6.5

## 2012-03-23 LAB — COMPREHENSIVE METABOLIC PANEL
ALT: 10 U/L (ref 0–35)
AST: 26 U/L (ref 0–37)
Albumin: 4.2 g/dL (ref 3.5–5.2)
Alkaline Phosphatase: 91 U/L (ref 39–117)
BUN: 6 mg/dL (ref 6–23)
CO2: 30 mEq/L (ref 19–32)
Calcium: 9 mg/dL (ref 8.4–10.5)
Chloride: 103 mEq/L (ref 96–112)
Creat: 0.53 mg/dL (ref 0.50–1.10)
Glucose, Bld: 90 mg/dL (ref 70–99)
Potassium: 4.2 mEq/L (ref 3.5–5.3)
Sodium: 139 mEq/L (ref 135–145)
Total Bilirubin: 0.8 mg/dL (ref 0.3–1.2)
Total Protein: 6.9 g/dL (ref 6.0–8.3)

## 2012-03-23 LAB — AMYLASE: Amylase: 33 U/L (ref 0–105)

## 2012-03-23 LAB — POCT UA - MICROSCOPIC ONLY
Casts, Ur, LPF, POC: NEGATIVE
Crystals, Ur, HPF, POC: NEGATIVE
Epithelial cells, urine per micros: NEGATIVE
Mucus, UA: NEGATIVE
Yeast, UA: NEGATIVE

## 2012-03-23 LAB — LIPASE: Lipase: 23 U/L (ref 0–75)

## 2012-03-23 MED ORDER — CIPROFLOXACIN HCL 500 MG PO TABS: 500.0000 mg | ORAL_TABLET | Freq: Two times a day (BID) | ORAL | Status: DC

## 2012-03-23 MED ORDER — METRONIDAZOLE 500 MG PO TABS: ORAL_TABLET | ORAL | Status: DC

## 2012-03-23 NOTE — Patient Instructions (Signed)
Diverticulitis A diverticulum is a small pouch or sac on the colon. Diverticulosis is the presence of these diverticula on the colon. Diverticulitis is the irritation (inflammation) or infection of diverticula. CAUSES  The colon and its diverticula contain bacteria. If food particles block the tiny opening to a diverticulum, the bacteria inside can grow and cause an increase in pressure. This leads to infection and inflammation and is called diverticulitis. SYMPTOMS   Abdominal pain and tenderness. Usually, the pain is located on the left side of your abdomen. However, it could be located elsewhere.   Fever.   Bloating.   Feeling sick to your stomach (nausea).   Throwing up (vomiting).   Abnormal stools.  DIAGNOSIS  Your caregiver will take a history and perform a physical exam. Since many things can cause abdominal pain, other tests may be necessary. Tests may include:  Blood tests.   Urine tests.   X-ray of the abdomen.   CT scan of the abdomen.  Sometimes, surgery is needed to determine if diverticulitis or other conditions are causing your symptoms. TREATMENT  Most of the time, you can be treated without surgery. Treatment includes:  Resting the bowels by only having liquids for a few days. As you improve, you will need to eat a low-fiber diet.   Intravenous (IV) fluids if you are losing body fluids (dehydrated).   Antibiotic medicines that treat infections may be given.   Pain and nausea medicine, if needed.   Surgery if the inflamed diverticulum has burst.  HOME CARE INSTRUCTIONS   Try a clear liquid diet (broth, tea, or water for as long as directed by your caregiver). You may then gradually begin a low-fiber diet as tolerated. A low-fiber diet is a diet with less than 10 grams of fiber. Choose the foods below to reduce fiber in the diet:   White breads, cereals, rice, and pasta.   Cooked fruits and vegetables or soft fresh fruits and vegetables without the skin.     Ground or well-cooked tender beef, ham, veal, lamb, pork, or poultry.   Eggs and seafood.   After your diverticulitis symptoms have improved, your caregiver may put you on a high-fiber diet. A high-fiber diet includes 14 grams of fiber for every 1000 calories consumed. For a standard 2000 calorie diet, you would need 28 grams of fiber. Follow these diet guidelines to help you increase the fiber in your diet. It is important to slowly increase the amount fiber in your diet to avoid gas, constipation, and bloating.   Choose whole-grain breads, cereals, pasta, and brown rice.   Choose fresh fruits and vegetables with the skin on. Do not overcook vegetables because the more vegetables are cooked, the more fiber is lost.   Choose more nuts, seeds, legumes, dried peas, beans, and lentils.   Look for food products that have greater than 3 grams of fiber per serving on the Nutrition Facts label.   Take all medicine as directed by your caregiver.   If your caregiver has given you a follow-up appointment, it is very important that you go. Not going could result in lasting (chronic) or permanent injury, pain, and disability. If there is any problem keeping the appointment, call to reschedule.  SEEK MEDICAL CARE IF:   Your pain does not improve.   You have a hard time advancing your diet beyond clear liquids.   Your bowel movements do not return to normal.  SEEK IMMEDIATE MEDICAL CARE IF:   Your pain becomes   worse.   You have an oral temperature above 102 F (38.9 C), not controlled by medicine.   You have repeated vomiting.   You have bloody or black, tarry stools.   Symptoms that brought you to your caregiver become worse or are not getting better.  MAKE SURE YOU:   Understand these instructions.   Will watch your condition.   Will get help right away if you are not doing well or get worse.  Document Released: 09/23/2005 Document Revised: 12/03/2011 Document Reviewed:  01/19/2011 Exit[employer] Patient Information 2012 [employer]. 

## 2012-03-23 NOTE — Progress Notes (Signed)
  Subjective:    Patient ID: Denise King, female    DOB: [birth year], 61 y.o.   MRN: [id number]  HPI patient enters with long-standing chronic back pain. She is on multiple medications for this. She enters with approximately 4-5 day history of pain in her flank area. She has not any burning or stinging on urination. She did have an episode of diarrhea earlier in the week. She does have a history of treatment for diverticulitis in the past. She states she's had multiple colonoscopies.    Review of Systems she is not having definite fever or chills. She has pain in both flanks and back area.    Objective:   Physical Exam  Constitutional:       Patient appears that she does not feel well but nontoxic.  HENT:  Head: Normocephalic.  Eyes: Pupils are equal, round, and reactive to light.  Neck: No JVD present. No tracheal deviation present. No thyromegaly present.  Cardiovascular: Normal rate and regular rhythm.   Pulmonary/Chest: Breath sounds normal. No respiratory distress. She has no wheezes. She has no rales. She exhibits no tenderness.  Abdominal:       The abdomen is flat. There no masses palpable. There is tenderness in the midepigastrium which extends around to the left upper abdomen and left mid abdomen.  Lymphadenopathy:    She has no cervical adenopathy.          Assessment & Plan:   Her white count is slightly elevated however her urinalysis is completely normal. There are no signs of urinary tract infection. This could be an a low-grade diverticulitis. We'll cover her for that with Flagyl and Cipro. JES seem amylase lipase were done and pending the

## 2012-03-24 LAB — VITAMIN B12: Vitamin B-12: 361 pg/mL (ref 211–911)

## 2012-03-25 LAB — URINE CULTURE: Colony Count: 30000

## 2012-03-28 ENCOUNTER — Telehealth: Payer: Self-pay | Admitting: Internal Medicine

## 2012-03-28 ENCOUNTER — Telehealth: Payer: Self-pay | Admitting: Pulmonary Disease

## 2012-03-28 NOTE — Telephone Encounter (Signed)
I spoke with [facility] and was advised they just received rx for pt alprazolam that was called in today. I advised pt of this and needed nothing further

## 2012-03-28 NOTE — Telephone Encounter (Signed)
03/26/30 On Call- patient asking refill Xanax to help her cope w/ diverticulitis. Says she called earlier in week but got no response. I called Xanax 0.5 mg, # 4, 1 twice daily prn, to [facility]. She is to call office for further guidance.

## 2012-03-30 ENCOUNTER — Encounter: Payer: Self-pay | Admitting: Pulmonary Disease

## 2012-03-30 ENCOUNTER — Telehealth: Payer: Self-pay | Admitting: Gastroenterology

## 2012-03-30 ENCOUNTER — Ambulatory Visit (INDEPENDENT_AMBULATORY_CARE_PROVIDER_SITE_OTHER): Payer: 59 | Admitting: Pulmonary Disease

## 2012-03-30 VITALS — BP 130/90 | HR 72 | Temp 96.8°F | Ht 64.0 in | Wt 132.2 lb

## 2012-03-30 DIAGNOSIS — K5730 Diverticulosis of large intestine without perforation or abscess without bleeding: Secondary | ICD-10-CM

## 2012-03-30 DIAGNOSIS — R109 Unspecified abdominal pain: Secondary | ICD-10-CM | POA: Insufficient documentation

## 2012-03-30 DIAGNOSIS — F17200 Nicotine dependence, unspecified, uncomplicated: Secondary | ICD-10-CM

## 2012-03-30 DIAGNOSIS — J42 Unspecified chronic bronchitis: Secondary | ICD-10-CM

## 2012-03-30 DIAGNOSIS — G894 Chronic pain syndrome: Secondary | ICD-10-CM

## 2012-03-30 DIAGNOSIS — K589 Irritable bowel syndrome without diarrhea: Secondary | ICD-10-CM

## 2012-03-30 DIAGNOSIS — M549 Dorsalgia, unspecified: Secondary | ICD-10-CM

## 2012-03-30 DIAGNOSIS — M899 Disorder of bone, unspecified: Secondary | ICD-10-CM

## 2012-03-30 DIAGNOSIS — K2100 Gastro-esophageal reflux disease with esophagitis, without bleeding: Secondary | ICD-10-CM

## 2012-03-30 DIAGNOSIS — I10 Essential (primary) hypertension: Secondary | ICD-10-CM

## 2012-03-30 DIAGNOSIS — F341 Dysthymic disorder: Secondary | ICD-10-CM

## 2012-03-30 DIAGNOSIS — E538 Deficiency of other specified B group vitamins: Secondary | ICD-10-CM

## 2012-03-30 DIAGNOSIS — I872 Venous insufficiency (chronic) (peripheral): Secondary | ICD-10-CM

## 2012-03-30 DIAGNOSIS — R1084 Generalized abdominal pain: Secondary | ICD-10-CM

## 2012-03-30 MED ORDER — LOSARTAN POTASSIUM 50 MG PO TABS: 50.0000 mg | ORAL_TABLET | Freq: Every day | ORAL | Status: DC

## 2012-03-30 NOTE — Telephone Encounter (Signed)
Spoke with Almyra Free and scheduled patient on 04/04/12 at 9:30 AM with Willette Cluster, NP.

## 2012-03-30 NOTE — Patient Instructions (Signed)
Today we updated your med list in our EPIC system...    Continue your current medications the same...  For your abd pain & bloating:    You have LOTS of pain meds from DrPhillips...    Take the PROTONIX daily- 30 min before the 1st meal of the day...    Try SIMETHACONE for gas >> use MYLICON 4 times daily for upper gas, and PHAZYME 4 times daily for lower gas...     We will schedule a follow up CT scan of your abd & pelvis to further eval of your discomfort...    Due to the increased "stool burden" try to clean out by taking>>    1 bottle of Citrate of Magnesia followed by Lots of water...    Then take 4 Dulcolax tabs at bedtime...    If needed follow that w/ a Dulcolax suppository in the AM...  We will arrange for a follow up appt w/ your gastroenterologist ASAP.Marland KitchenMarland Kitchen

## 2012-03-30 NOTE — Progress Notes (Signed)
Subjective:     Patient ID: [facility], female   DOB: [birth year], 60 y.o.   MRN: [id number]  HPI 61 y/o WF here for a follow up visit... she has multiple medical problems as noted below...   ~  August 06, 2009:  had back surg w/ fusion by [facility]Cohen at [facility] in Oct09... much difficulty after surg- out of work til 4/10 (ret part time) and 8/10 (just ret full time)... she is seeing DrPhillips for pain management now on EMBEDA 50-2 Bid + MSIR for breakthrough pain...  ~  February 08, 2010:  recent GI bug & [name] flair w/ left flank pain- stool studies neg & Rx w/ Levsin, empiric Flagyl, Florastor, & Lomotil...  she continues regular f/u in pain clinic w/ DrPhillips- now on OPANA & Cymbalta...  ~  February 03, 2011:  1 yr ROV & she reports lots of GI problems- managed by DrKaplan; and chronic back pain- managed by DrPhillips on OPANA ER 20mg Bid & PERCOCET10- 4 per day...     She has GERD, [name], Divertics, & pos FamHx colon ca in brother;  chr abd pain, bloating, etc w/ mult tests> CT scan, Colonoscopy, Labs, medication trials, etc...    She continues to smoke 1ppd- CXR w/ chr changes, NAD;  denies much cough, sputum, SOB but sedentary due to her back; declines smoking cessation help...    Osteoporosis eval & Rx by her GYN, DrHolland- treated w/ Reclast by her hx (we don't have records)...    She has known B12 defic & she was on monthly B12 shots but for some reason she stopped> f/u B12 level is low at 213 today & she will re-start B12 shots monthly...  ~  January 01, 2012:  70mo ROV & it's been a rough year for [facility] w/ problems in several areas as reviewed below;  She also notes incr BP recently but she believes due to her incr pain level, and notes her pulse is always rapid> we discussed trial BBlocker for this w/ TOPROL XL 25mg /d to start as she is scared of side effects of medications...    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    HBP> not currently on meds & she thinks hi readings at home are due to her pain; BP= 126/90 & pulse= 94 (readings higher at home); we decided on trial of low dose BBlocker ToprolXL 25mg /d as noted above...    GI> managed by DrKaplan & seen every 2-52mo, she is c/o constant pain & diarrhea; now on Lotronex which seems to help the diarrhea but not her pain...    Chr Pain> Hx severe back problems- DrCohen, & pain managed by DrPhillips on Opana20Bid & Percocet10- now taking 5/d; "they can't go higher & pain is getting worse"...    Nerves/ Insomnia> DrKaplan started Xanax 1mg Qhs & she adds OTC Unisom25mg Qhs; we have filled her Rx for Cymbalta 60mg Qhs.  ~  March 30, 2012:  81mo ROV & here for recheck of her BP which is fair at 130/90 on Metoprolol25Bid, Lasix20/d, & K10/s; she notes that the BBlocker "adds another layer of tiredness" she says; we discussed adding LOSARTAN 50mg /d & backing off the Metoprolol to 25mg /d (depending on her response we may be able to incr the Losar & wean the Metop off)...  Her CC today however is abd pain & discomfort w/ nausea "mostly  on left side but really all over abd"; she states this is different than her [name] symptoms; she reports going to [facility] (see note 3/27 by DrDaub) & they felt she had prob diverticulitis & treated her w/ Cipro & Flagyl (CBC was normal w/ Hg=13.1, WBC=10.6 w/ 48% neutrophils, CMet-wnl, Amylase/Lipase-wnl, Urine-clear);  She reports NO BETTER after the antibiotics & reports bloating, constipated, w/ one sm BM daily ("I'm on the BRAT diet");  Past hx severe GERD w/ erosions on Protonix daily & last EGD 1/10 was neg;  Normal gastric emptying scan;  CTAbd 1/11 & 2/11 showed no acute dis just retained stool throughout colon & cyst in liver, kidney, & 8mm cystic lesion of the ventral pancreatic body (no change from 2006); symptoms felt to be secondary to narcotic analgesics she takes for her chronic back pain &  chronic pain syndrome... Note: no divertics on CT Abd, but DrKaplan saw some on colonoscopy 1/12 (otherw unremarkable);  Last procedure was a CT Enterography 11/12 w/o acute process identified (the 8mm pseudocyst in pancreas was seen);  She has PROTONIX, LEVSIN, LOTRONEX, ALIGN, & ZOFRAN...  We discussed Rx w/ Anti-gas medications MYLICON & PHAZYME, and to be sure she's not constip w/ her narcotic analgesics- rec to take Citrate of Magnesia + water, & Dulcolax if needed;  We will arrange for f/u w/ her gastroenterologist...    CT ABD 4/13 repeat==> neg for acute abn, but CBD is dil at 8.39mm (prev6.3mm)- no stones or obstructing mass seen; GB appears normal; incidental findings include some scarring at right base, 6mm left hep cyst, kidneys & pancreas appear normal, osteopenia & post op L4-5 laminectomy & fusion...          Problem List:  VERTIGO (ICD-780.4) - [facility] 8/08 w/ neg eval and eventually referred to Neurology w/ nothing found; Rx'd w/ Antivert, Phenergan, vestib therapy...  BRONCHITIS, RECURRENT (ICD-491.9) - 1ppd smoker w/ hx recurrent bronchitis... she refuses smoking cessation counseling, Chantix, etc... notes min cough, sputum & SOB- sedentary due to back prob... ~  CXR 2/12 showed normal heart size, COPD w/ sl incr markings, nipple shadows noted...  VENOUS INSUFFICIENCY (ICD-459.81) - she knows to avoid sodium, elevate legs, wear support hose... has LASIX 20mg  Prn w/ KCl .  REFLUX ESOPHAGITIS (ICD-530.11) - EGD 9/05 by DrPerry showed mod severe reflux esoph w/ erosions,  Rx PROTONIX 40mg /d regularly w/ good control, but states she needs it Bid;  She uses ZOFRAN 4mg  as needed for nausea.  DIVERTICULOSIS OF COLON (ICD-562.10), &  [name] (ICD-564.1) - on LEVSIN .125 SL Q4H prn> prev on Levbid & Librax... ~  extensive GI eval by DrKaplan 1/10 for abd pain, [name], etc... neg EGD, neg CTAbd, neg Emptying scan... believed secondary to narcotic analgesics... ~  on-going GI eval by DrKaplan w/  repeat CT Abd 2/11 (stable benign cysts liver/ pancreas/ renal), Colonoscopy 1/12 (sigm divertics & neg biopsies) etc... ~  Continued GI evaluation by DrKaplan in 2012 w/ ER visit 8/12 for [name] w/ N/ V; & Labs/ CT enterography, etc;  LOTRONEX helps her diarrhea + Probiotics. ~  4/13:  Recurrent severe abd pain, bloating, nausea, etc> SEE ABOVE & referred back to GI DrKaplan...  COLONIC POLYPS (ICD-211.3) - prev colonoscopy 2/06 by [name] showed Divertic, otherw neg; f/u 20yrs w/ +fam hx colon ca. ~  as above> f/u colon 1/12 w/ divertics otherw neg.  CALCULUS, KIDNEY (ICD-592.0) - Hx of Kidney Stones & small renal cysts on CT scan & sonar - followed by  DrWrenn.  BACK PAIN, LUMBAR (ICD-724.2) - initial eval and rx by [facility] w/ DDD and no surg possible... she's had series of epidural shots from DrStrong without relief... additional opinion from [facility]- surg not recommended... referred to a chronic pain clinic... ~  fall w/ injury 2009 & further eval from DrCohen- she decided to proceed w/ spinal surgery & fusion by DrCohen 10/09 at [facility]... much difficulty post op & now followed in the pain management clinic by DrPhillips. ~  she reports another opinion from DrBranch at [facility]- "he is considering additional surg"...  CHRONIC PAIN SYNDROME (ICD-338.4) - followed by drPhillips pain clinic... currently on OPANA ER 20mg  Bid & PERCOCET10- up to 5 daily... also on CYMBALTA 60mg /d...  OSTEOPENIA (ICD-733.90) - she was prev on EVISTA, Ca++, Vits (w/ BMD per GYN)... she stopped the Evista due to nausea which she feels is better off this med... f/u by [facility] & pt reports osteoporosis w/ RECLAST infusion... ~  labs 3/09 showed Vit d level = 12... started on Vit D 50000 u weekly. ~  labs 6/10 showed Vit D level = 49... OK to switch to 1000 u OTC daily. ~  f/u GYN eval and BMD at [facility]> she reports Rx w/ RECLAST for osteoposis (we don't have records).  HEADACHE (ICD-784.0)  ANXIETY  (ICD-300.00) - prev on Lexapro & Xanax, but these were stopped in favor of CYMBALTA 60mg /d; prev on Lunesta but DrKaplan switched her to [facility] 1mg Qhs & she added Unisom 25mg  OTC...  VITAMIN B12 DEFICIENCY (ICD-266.2) -  ~  labs 1010 showed Vit B12 level = 118 & started on B 12 shots monthly...  ~  labs 2/11 showed Vit B12 level = >1500... for some reason she stopped the B12 shots in 2011. ~  labs 2/12 showed Vit B12 level = 213... rec to resume B12 shots monthly. ~  Labs 8/12 by [name] showed Vit B12 level = 517, Folate = 6.1 ~  Labs 3/13 showed B12 level = 361  HEALTH MAINTENANCE:  GYN= DrHolland & PAP, Mammogram, etc... all up-to-date; she is due a BMD in 1/10; and colon 10/11...   Past Surgical History  Procedure Date  . Appendectomy   . Tubal ligation   . Back surgery 10-09    Dr. Noel Gerold  . Tonsillectomy     Outpatient Encounter Prescriptions as of 03/30/2012  Medication Sig Dispense Refill  . alosetron (LOTRONEX) 1 MG tablet 1 tab by mouth twice daily every other day       . bifidobacterium infantis (ALIGN) capsule Take 1 capsule by mouth daily.  28 capsule  0  . Cholecalciferol (VITAMIN D3) 1000 UNITS tablet Take 1,000 Units by mouth daily.        . cyanocobalamin (,VITAMIN B-12,) 1000 MCG/ML injection Inject 1,000 mcg into the muscle every 30 (thirty) days.      . Doxylamine Succinate, Sleep, (UNISOM) 25 MG tablet Take 25 mg by mouth at bedtime as needed.        . DULoxetine (CYMBALTA) 60 MG capsule Take 1 capsule (60 mg total) by mouth daily.  30 capsule  11  . furosemide (LASIX) 20 MG tablet Take 20 mg by mouth daily as needed.       . hyoscyamine (LEVSIN SL) 0.125 MG SL tablet Place 0.125 mg under the tongue every 4 (four) hours as needed.        . metoprolol tartrate (LOPRESSOR) 25 MG tablet Take 1 tablet (25 mg total) by mouth 2 (two)  times daily.  30 tablet  6  . Multiple Vitamins-Minerals (MULTIVITAMIN WITH MINERALS) tablet Take 1 tablet by mouth daily.        .  ondansetron (ZOFRAN) 4 MG tablet Take 1 tablet (4 mg total) by mouth every 4 (four) hours as needed.  30 tablet  5  . oxyCODONE-acetaminophen (PERCOCET) 10-325 MG per tablet Take 1 tablet by mouth up to 5 times daily as directed by Dr. Vear Clock      . oxymorphone (OPANA ER) 20 MG 12 hr tablet Take 20 mg by mouth every 12 (twelve) hours. As directed by Dr. Vear Clock.       . pantoprazole (PROTONIX) 40 MG tablet Take 40 mg by mouth daily as needed.       . potassium chloride (KLOR-CON) 10 MEQ CR tablet Take 10 mEq by mouth daily as needed. Take with lasix      . XANAX 0.5 MG tablet TAKE 1/2 TO 1 TABLET THREE TIMES DAILY AS NEEDED.  90 each  5  . zoledronic acid (RECLAST) 5 MG/100ML SOLN Inject 5 mg into the vein once. yearly       . hyoscyamine (LEVSIN SL) 0.125 MG SL tablet Place 1 tablet (0.125 mg total) under the tongue every 4 (four) hours as needed for cramping.  25 tablet  0  . DISCONTD: ciprofloxacin (CIPRO) 500 MG tablet Take 1 tablet (500 mg total) by mouth 2 (two) times daily.  14 tablet  0  . DISCONTD: metroNIDAZOLE (FLAGYL) 500 MG tablet Patient to take one tablet twice a day  14 tablet  0    Allergies  Allergen Reactions  . Atorvastatin     REACTION: pt states "nausea \\T \ vomiting"  . Levaquin     Reaction: achilles tendon pain  . Sulfonamide Derivatives     REACTION: pt states "rash"  . Trazodone And Nefazodone     Current Medications, Allergies, Past Medical History, Past Surgical History, Family History, and Social History were reviewed in [employer] record.    Review of Systems        See HPI - all other systems neg except as noted...  The patient complains of dyspnea on exertion, headaches, abdominal pain, and depression.  The patient denies anorexia, fever, weight loss, weight gain, vision loss, decreased hearing, hoarseness, chest pain, syncope, peripheral edema, prolonged cough, hemoptysis, melena, hematochezia, severe indigestion/heartburn,  hematuria, incontinence, muscle weakness, suspicious skin lesions, transient blindness, difficulty walking, unusual weight change, abnormal bleeding, enlarged lymph nodes, and angioedema.     Objective:   Physical Exam     WD, WN, 61 y/o WF in NAD... GENERAL:  Alert & oriented; pleasant & cooperative... HEENT:  [place]/AT,  EACs-clear, TMs-wnl, NOSE-clear, THROAT-clear but dryMMs. NECK:  Supple w/ fairROM; no JVD; normal carotid impulses w/o bruits; no thyromegaly or nodules palpated; no lymphadenopathy. CHEST:  Clear to P & A; without wheezes/ rales/ or rhonchi heard... HEART:  Regular Rhythm; without murmurs/ rubs/ or gallops detected... ABDOMEN:  Soft & mild tender diffusely in abd; normal bowel sounds; no organomegaly or masses palpated... EXT: without deformities, mild arthritic changes; no varicose veins/ +venous insuffic/ no edema.,  limp w/ walking.  BACK:  c/o mod severe back pain... NEURO:  no focal neuro deficits... DERM:  no lesions seen...  RADIOLOGY DATA:  Reviewed in the EPIC EMR & discussed w/ the patient...    >>Last CXR 2/12 showed underlying COPD w/ sl incr markings, NAD...    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  LABORATORY DATA:  Reviewed in the EPIC EMR & discussed w/ the patient...   Assessment:     Abd Pain, Bloating, etc>> GERD, Divertics, [name], +FamHx colon ca>  On Protonix, Levsin, Lotronex, Zofran, Align; we discussed trying Anti-gas medication w/ MYLICON & PHAZYME, plus r/o incr fecal burden/constipation w/ laxative regimen (MagCitrate + Dulcolax); with her degree of severity & complexity she needs subspecialty involvement I believe & we will set up appt w/ GI...  HBP>  BP improved on Metoprolol25Bid, Lasix20, K10; but she feels "extra tired" on the BBlocker; we discussed addition of LOSARTAN50 & slowly wean the Metop.   Smoker, Chr Bronchitis>  Must quit all smoking but she refuses smoking cessation help; doesn't feel her  breathing is bad enough for chronic meds/ inhalers/ etc...  Ven Insuffic>  On low sodium diet & has Lasix20 + K10 to use as needed...  Hx Kidney Stones>  Followed by DrWrenn...  DJD, LBP>  Followed by [facility] & DrCohen who did surg in [place] 10/09 (she has also seen DrNudelman & DrBranch at [facility])...  Chronic Pain Syndrome>  Managed by DrPhillips pain clinic on meds as noted...  Anxiety & Insomnia>  On meds listed...  Vit B12 Defic>  On B12 shots monthly...     Plan:     Patient's Medications  New Prescriptions   No medications on file  Previous Medications   ALOSETRON (LOTRONEX) 1 MG TABLET    1 tab by mouth twice daily every other day    BIFIDOBACTERIUM INFANTIS (ALIGN) CAPSULE    Take 1 capsule by mouth daily.   CHOLECALCIFEROL (VITAMIN D3) 1000 UNITS TABLET    Take 1,000 Units by mouth daily.     CYANOCOBALAMIN (,VITAMIN B-12,) 1000 MCG/ML INJECTION    Inject 1,000 mcg into the muscle every 30 (thirty) days.   DOXYLAMINE SUCCINATE, SLEEP, (UNISOM) 25 MG TABLET    Take 25 mg by mouth at bedtime as needed.     DULOXETINE (CYMBALTA) 60 MG CAPSULE    Take 1 capsule (60 mg total) by mouth daily.   FUROSEMIDE (LASIX) 20 MG TABLET    Take 20 mg by mouth daily as needed.    HYOSCYAMINE (LEVSIN SL) 0.125 MG SL TABLET    Place 1 tablet (0.125 mg total) under the tongue every 4 (four) hours as needed for cramping.   HYOSCYAMINE (LEVSIN SL) 0.125 MG SL TABLET    Place 0.125 mg under the tongue every 4 (four) hours as needed.     METOPROLOL TARTRATE (LOPRESSOR) 25 MG TABLET    Take 1 tablet (25 mg total) by mouth 2 (two) times daily.   MULTIPLE VITAMINS-MINERALS (MULTIVITAMIN WITH MINERALS) TABLET    Take 1 tablet by mouth daily.     ONDANSETRON (ZOFRAN) 4 MG TABLET    Take 1 tablet (4 mg total) by mouth every 4 (four) hours as needed.   OXYCODONE-ACETAMINOPHEN (PERCOCET) 10-325 MG PER TABLET    Take 1 tablet by mouth up to 5 times daily as directed by Dr. Vear Clock   OXYMORPHONE [facility] ER) 20 MG  12 HR TABLET    Take 20 mg by mouth every 12 (twelve) hours. As directed by Dr. Vear Clock.    PANTOPRAZOLE (PROTONIX) 40 MG TABLET    Take 40 mg by mouth daily as needed.    POTASSIUM CHLORIDE (KLOR-CON) 10 MEQ CR TABLET    Take 10 mEq by mouth daily as needed. Take  with lasix   XANAX 0.5 MG TABLET    TAKE 1/2 TO 1 TABLET THREE TIMES DAILY AS NEEDED.   ZOLEDRONIC ACID (RECLAST) 5 MG/100ML SOLN    Inject 5 mg into the vein once. yearly   Modified Medications   No medications on file  Discontinued Medications   CIPROFLOXACIN (CIPRO) 500 MG TABLET    Take 1 tablet (500 mg total) by mouth 2 (two) times daily.   METRONIDAZOLE (FLAGYL) 500 MG TABLET    Patient to take one tablet twice a day

## 2012-03-31 ENCOUNTER — Ambulatory Visit (INDEPENDENT_AMBULATORY_CARE_PROVIDER_SITE_OTHER)
Admission: RE | Admit: 2012-03-31 | Discharge: 2012-03-31 | Disposition: A | Discharge status: Home / Self Care | Payer: 59 | Source: Ambulatory Visit | Attending: Pulmonary Disease | Admitting: Pulmonary Disease

## 2012-03-31 DIAGNOSIS — R1084 Generalized abdominal pain: Secondary | ICD-10-CM

## 2012-03-31 MED ORDER — IOHEXOL 300 MG/ML  SOLN
80.0000 mL | Freq: Once | INTRAMUSCULAR | Status: AC | PRN
  Administered 2012-03-31: 80 mL via INTRAVENOUS

## 2012-04-04 ENCOUNTER — Encounter: Payer: Self-pay | Admitting: Internal Medicine

## 2012-04-04 ENCOUNTER — Ambulatory Visit (INDEPENDENT_AMBULATORY_CARE_PROVIDER_SITE_OTHER): Payer: 59 | Admitting: Nurse Practitioner

## 2012-04-04 ENCOUNTER — Encounter: Payer: Self-pay | Admitting: Nurse Practitioner

## 2012-04-04 VITALS — BP 120/80 | Ht 64.0 in | Wt 145.0 lb

## 2012-04-04 DIAGNOSIS — R1010 Upper abdominal pain, unspecified: Secondary | ICD-10-CM

## 2012-04-04 DIAGNOSIS — R110 Nausea: Secondary | ICD-10-CM

## 2012-04-04 DIAGNOSIS — R932 Abnormal findings on diagnostic imaging of liver and biliary tract: Secondary | ICD-10-CM

## 2012-04-04 DIAGNOSIS — R109 Unspecified abdominal pain: Secondary | ICD-10-CM

## 2012-04-04 NOTE — Progress Notes (Signed)
[facility] [id number] 10-Nov-1951   HISTORY OR PRESENT ILLNESS :  Patient is a 61 year old female known to [facility] for history of irritable bowel syndrome and a strong family history of colon cancer in brother and father.. She takes Lotronex and anticholinergics as needed. and was doing well when last seen December 2012.  Patient referred by [facility] for evaluation of abdominal pain and common bile duct dilation on recent CT scan. Over the last two weeks patient has had frequent nausea and left mid abdominal pain, worse after eating and worse in the evening. This is not her irritable bowel pain . Patient went to see [facility] on 3/27, her comprehensive metabolic profile was normal. CBC minimally elevated 10.6, normal hemoglobin 13.1. Amylase and lipase normal at  33 and 23 respectively. She was given Cipro and Flagyl empirically for diverticulitis. She was started on a BRAT diet . Patient had a followup appointment 4/3 with [facility] who appears to be her main caregiver . Pain did not improve with antibiotics. A CT scan of the abdomen and pelvis was done and revealed an increased common bile duct from 6.77mm atl last exam to 8.51mm. Currently patient is still having upper abdominal pain but the nausea is better with Zofran.Patient concerned about taking a proton pump inhibitor because of osteoporosis.  Current Medications, Allergies, Past Medical History, Past Surgical History, Family History and Social History were reviewed in [employer] record.   PHYSICAL EXAMINATION : General:  Well developed  female in no acute distress Head: Normocephalic and atraumatic Eyes:  sclerae anicteric,conjunctive pink. Ears: Normal auditory acuity Neck: Supple, no masses.  Lungs: Clear throughout to auscultation Heart: Regular rate and rhythm Abdomen: Soft, nondistended, mild mid upper abdominal tenderness.. No masses or hepatomegaly noted. Normal bowel sounds Rectal: not  done Musculoskeletal: Symmetrical with no gross deformities  Skin: No lesions on visible extremities Extremities: No edema or deformities noted Neurological: Oriented, grossly nonfocal Cervical Nodes:  No significant cervical adenopathy Psychological:  Alert and cooperative. Normal mood and affect  ASSESSMENT AND PLAN :  1. Mid upper abdominal pain and nausea worse after meals. Recent CTscan shows an increase in size of her common bile duct when compared with previous studies. (from 6.3 to 8.23mm). .Her pain is constant, worse with meals. CT scan findings may be incidental. I think we should start with an upper endoscopy for further evaluation.The benefits, risks, and potential complications of EGD with possible biopsies were discussed with the patient and she agrees to proceed. If EGD is negative she will probably need further abdominal imaging studies, see #2.   2. Increased size of CBD on recent CTscan. Gallbladder normal, no gallstones. No focal pancreatic abnormalities seen. Her LFTs are normal. She may need MRCP following EGD.  3. irritable bowel syndrome, stable.  4. Chronic back pain requiring chronic pain meds  5. [facility] of colon cancer, up to date on screenings.

## 2012-04-04 NOTE — Patient Instructions (Signed)
We scheduled the Endoscopy with Dr. Stan Head in Dr. Marzetta Board absence. Directions and brochure provided.

## 2012-04-05 ENCOUNTER — Encounter: Payer: Self-pay | Admitting: Nurse Practitioner

## 2012-04-05 IMAGING — CR DG THORACIC SPINE 2V
3 series · 3 of 3 positions shown · non-contrast
Comparison: Chest radiographs 07/31/2008.

CLINICAL DATA: Thoracic pain status post fall last night.

THORACIC SPINE - 2 VIEW

[t t-spine a.p.]
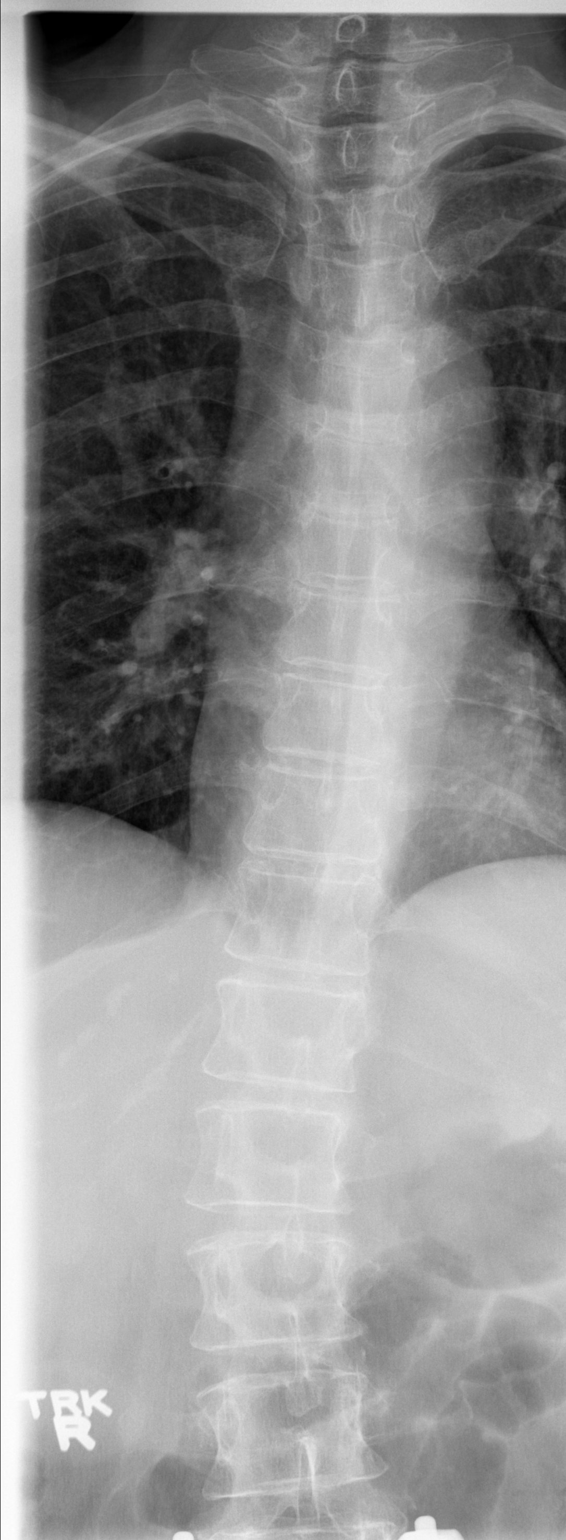

[t t-spine lat *]
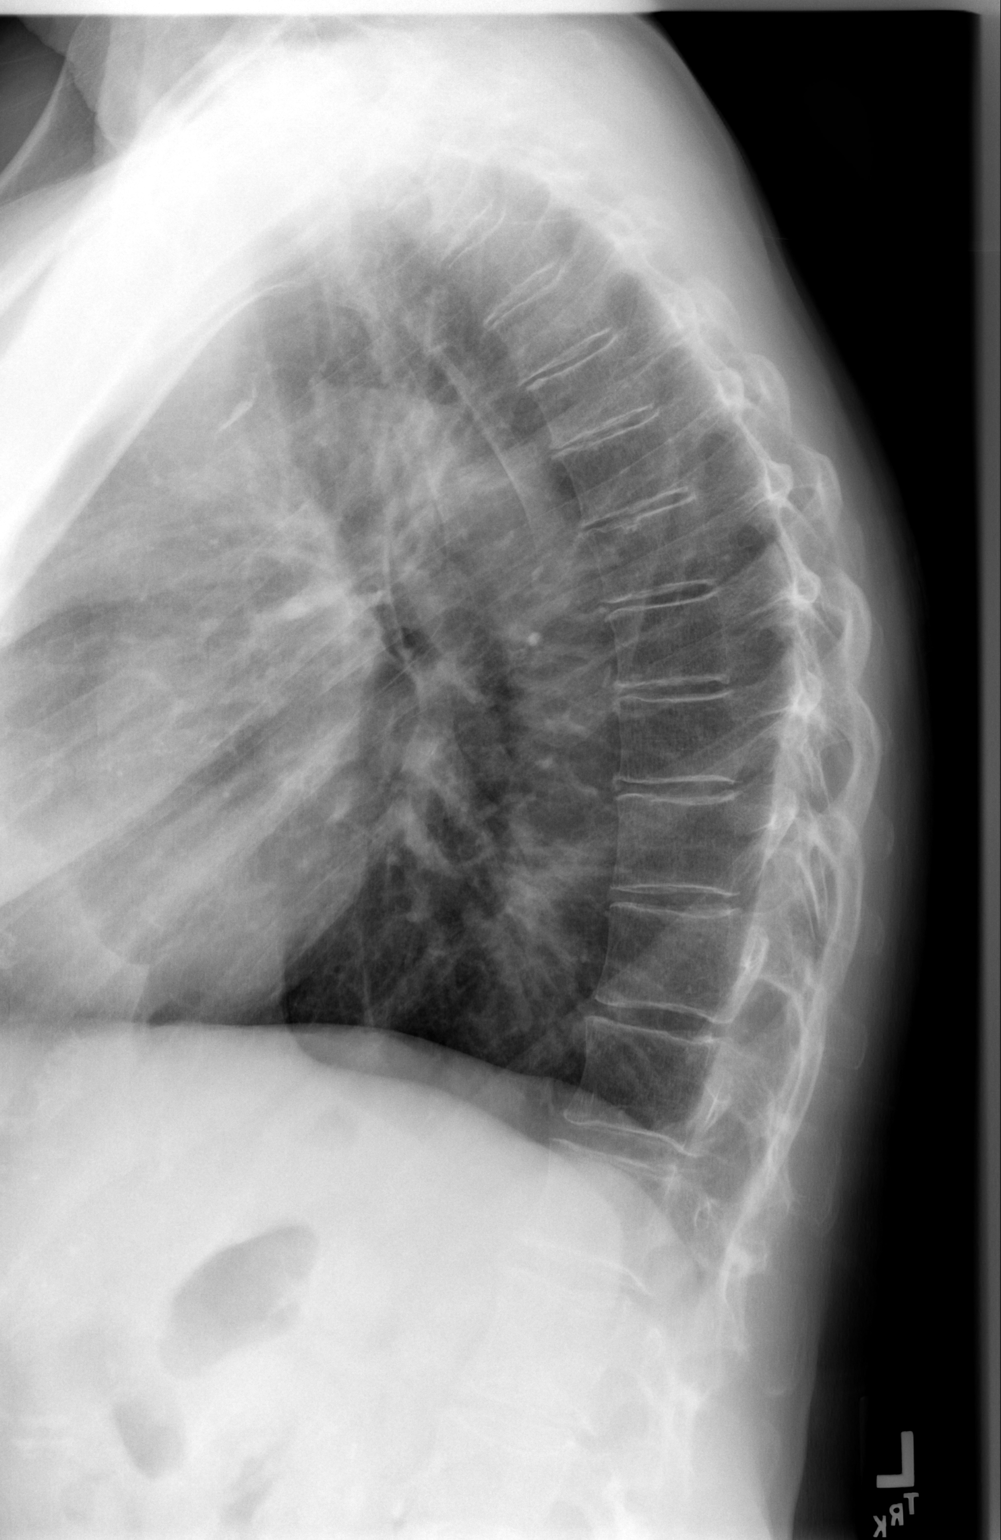

[w t-spine lat *]
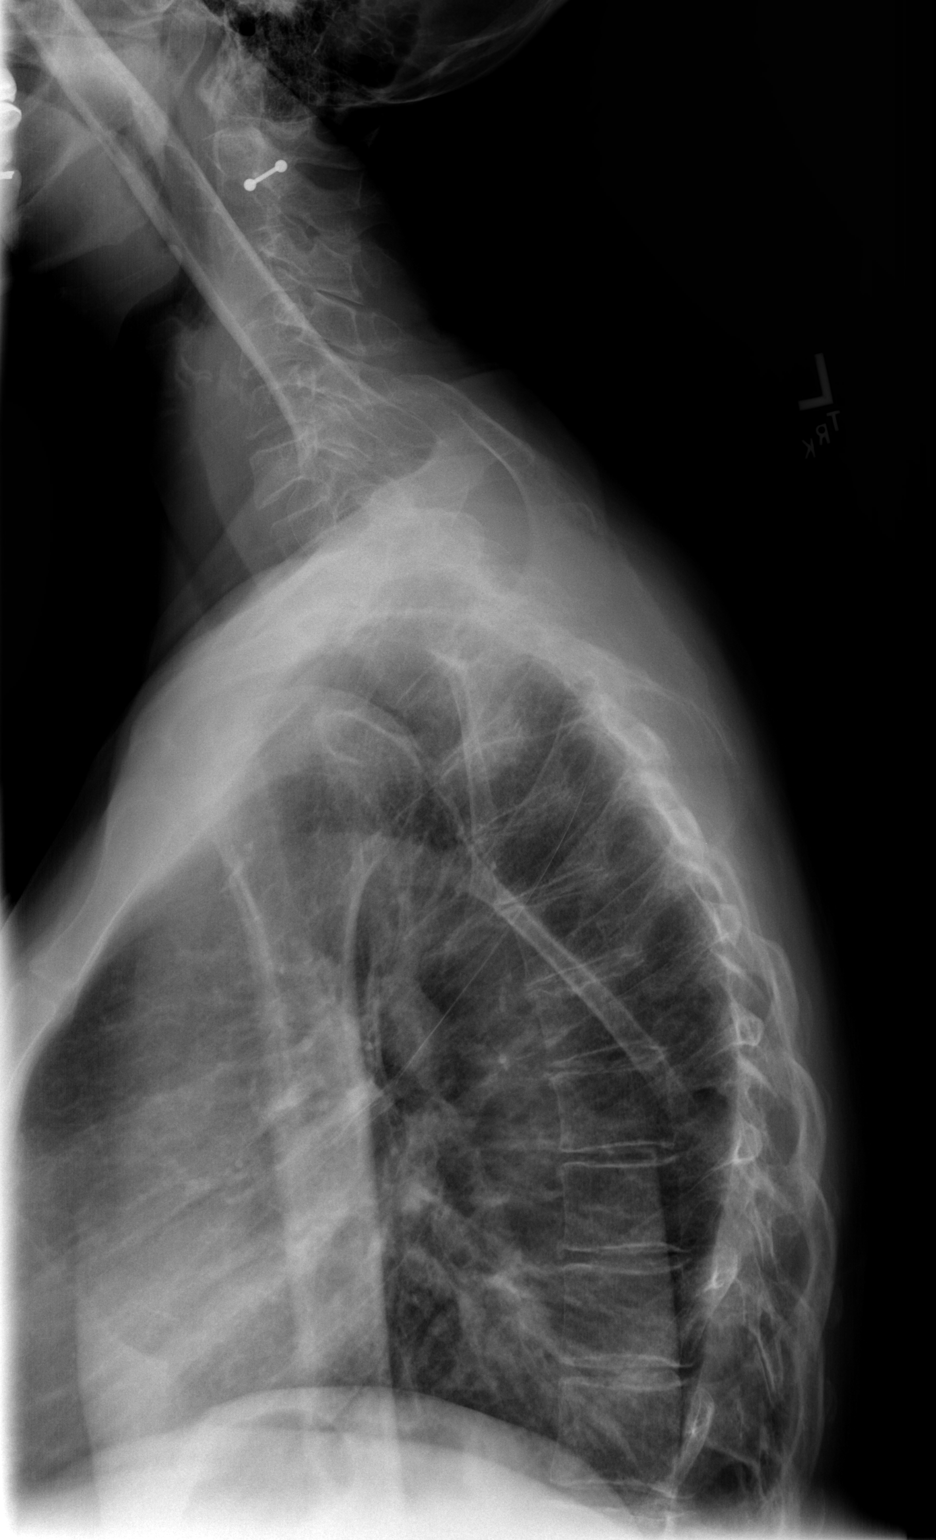

[3 of 3 positions shown; findings below may reference images not displayed]

FINDINGS: There is conventional anatomy with 12 rib-bearing
thoracic type vertebral bodies.  There is a mild thoracolumbar
scoliosis.  No fracture, paraspinal hematoma or widening of the
interpedicular distance is identified.
IMPRESSION: No acute osseous findings.  Mild scoliosis.

## 2012-04-05 NOTE — Progress Notes (Signed)
Agree with Ms. Guenther's assessment and plan. I have discussed with her. Iva Boop, MD, Clementeen Graham

## 2012-04-07 ENCOUNTER — Ambulatory Visit (AMBULATORY_SURGERY_CENTER): Payer: 59 | Admitting: Internal Medicine

## 2012-04-07 ENCOUNTER — Encounter: Payer: Self-pay | Admitting: Internal Medicine

## 2012-04-07 VITALS — BP 139/80 | HR 95 | Temp 98.4°F | Resp 22 | Ht 64.0 in | Wt 145.0 lb

## 2012-04-07 DIAGNOSIS — K2970 Gastritis, unspecified, without bleeding: Secondary | ICD-10-CM

## 2012-04-07 DIAGNOSIS — R109 Unspecified abdominal pain: Secondary | ICD-10-CM

## 2012-04-07 DIAGNOSIS — R932 Abnormal findings on diagnostic imaging of liver and biliary tract: Secondary | ICD-10-CM

## 2012-04-07 DIAGNOSIS — D131 Benign neoplasm of stomach: Secondary | ICD-10-CM

## 2012-04-07 DIAGNOSIS — R110 Nausea: Secondary | ICD-10-CM

## 2012-04-07 DIAGNOSIS — K2990 Gastroduodenitis, unspecified, without bleeding: Secondary | ICD-10-CM

## 2012-04-07 MED ORDER — ALOSETRON HCL 1 MG PO TABS: 1.0000 mg | ORAL_TABLET | Freq: Two times a day (BID) | ORAL | Status: DC | PRN

## 2012-04-07 MED ORDER — SODIUM CHLORIDE 0.9 % IV SOLN: 500.0000 mL | INTRAVENOUS | Status: DC

## 2012-04-07 MED ORDER — PANTOPRAZOLE SODIUM 40 MG PO TBEC: 40.0000 mg | DELAYED_RELEASE_TABLET | Freq: Every day | ORAL | Status: DC

## 2012-04-07 NOTE — Patient Instructions (Addendum)
The stomach lining looks irritated or inflamed - called gastritis. Please be sure to take Protonix every day - it was listed as needed on your list - I changed that. Also be sure to take the Lotronex daily and even twice a day when having abdominal pain and diarrhea. Once the biopsies of the stomach are back will decide if any other testing is needed. Iva Boop, MD, FACG  YOU HAD AN ENDOSCOPIC PROCEDURE TODAY AT THE [facility]: Refer to the procedure report that was given to you for any specific questions about what was found during the examination.  If the procedure report does not answer your questions, please call your gastroenterologist to clarify.  If you requested that your care partner not be given the details of your procedure findings, then the procedure report has been included in a sealed envelope for you to review at your convenience later.  YOU SHOULD EXPECT: Some feelings of bloating in the abdomen. Passage of more gas than usual.  Walking can help get rid of the air that was put into your GI tract during the procedure and reduce the bloating. If you had a lower endoscopy (such as a colonoscopy or flexible sigmoidoscopy) you may notice spotting of blood in your stool or on the toilet paper. If you underwent a bowel prep for your procedure, then you may not have a normal bowel movement for a few days.  DIET: Your first meal following the procedure should be a light meal and then it is ok to progress to your normal diet.  A half-sandwich or bowl of soup is an example of a good first meal.  Heavy or fried foods are harder to digest and may make you feel nauseous or bloated.  Likewise meals heavy in dairy and vegetables can cause extra gas to form and this can also increase the bloating.  Drink plenty of fluids but you should avoid alcoholic beverages for 24 hours.  ACTIVITY: Your care partner should take you home directly after the procedure.  You should plan to take it easy,  moving slowly for the rest of the day.  You can resume normal activity the day after the procedure however you should NOT DRIVE or use heavy machinery for 24 hours (because of the sedation medicines used during the test).    SYMPTOMS TO REPORT IMMEDIATELY: A gastroenterologist can be reached at any hour.  During normal business hours, 8:30 AM to 5:00 PM Monday through Friday, call [phone].  After hours and on weekends, please call the GI answering service at [phone] who will take a message and have the physician on call contact you.   Following lower endoscopy (colonoscopy or flexible sigmoidoscopy):  Excessive amounts of blood in the stool  Significant tenderness or worsening of abdominal pains  Swelling of the abdomen that is new, acute  Fever of 100F or higher  Following upper endoscopy (EGD)  Vomiting of blood or coffee ground material  New chest pain or pain under the shoulder blades  Painful or persistently difficult swallowing  New shortness of breath  Fever of 100F or higher  Black, tarry-looking stools  FOLLOW UP: If any biopsies were taken you will be contacted by phone or by letter within the next 1-3 weeks.  Call your gastroenterologist if you have not heard about the biopsies in 3 weeks.  Our staff will call the home number listed on your records the next business day following your procedure to check on  you and address any questions or concerns that you may have at that time regarding the information given to you following your procedure. This is a courtesy call and so if there is no answer at the home number and we have not heard from you through the emergency physician on call, we will assume that you have returned to your regular daily activities without incident.  SIGNATURES/CONFIDENTIALITY: You and/or your care partner have signed paperwork which will be entered into your electronic medical record.  These signatures attest to the fact that that the  information above on your After Visit Summary has been reviewed and is understood.  Full responsibility of the confidentiality of this discharge information lies with you and/or your care-partner.

## 2012-04-07 NOTE — Op Note (Addendum)
[facility] [address] [place], [place]  [address]  ENDOSCOPY PROCEDURE REPORT  PATIENT:  Denise King, Denise King  MR#:  [id number] BIRTHDATE:  05-03-1951, 60 yrs. old  GENDER:  female  ENDOSCOPIST:  Iva Boop, MD, [facility]  PROCEDURE DATE:  04/07/2012 PROCEDURE:  EGD with biopsy, [address] ASA CLASS:  Class II INDICATIONS:  abdominal pain upper abdominal pain, mostly in the LUQ  MEDICATIONS:   MAC sedation, administered by CRNA, propofol (Diprivan) 150 mcg IV TOPICAL ANESTHETIC:  none  DESCRIPTION OF PROCEDURE:   After the risks benefits and alternatives of the procedure were thoroughly explained, informed consent was obtained.  The LB GIF-H180 [id number] endoscope was introduced through the mouth and advanced to the second portion of the duodenum, without limitations.  The instrument was slowly withdrawn as the mucosa was fully examined. <<PROCEDUREIMAGES>>  Moderate gastritis was found in the antrum. Erythema and irregular mucosa with red spots. Multiple biopsies were obtained and sent to pathology.  Otherwise the examination was normal.    Retroflexed views revealed no abnormalities.    The scope was then withdrawn from the patient and the procedure completed.  COMPLICATIONS:  None  ENDOSCOPIC IMPRESSION: 1) Moderate gastritis in the antrum - biopsied 2) Otherwise normal examination RECOMMENDATIONS: 1) take Protonix every day, not just as needed 2) await biopsy results before further testing - could need MRCP for increase in CBD caliber as on CT but her symptoms were atypical for biliary pain and LFT's normal 3) ADDENDUM: she reports Lotronex usually relieves this pain so I advised she use that more regularly than she has also - she can avoid it if constipated but should use when having abdominal pain and diarrhea  Iva Boop, MD, [facility]  CC:  The Patient and Michele Mcalpine, MD  n. REVISED:  04/07/2012 04:36 PM eSIGNED:   Iva Boop at 04/07/2012  04:36 PM  Esmond Camper, [phone]

## 2012-04-07 NOTE — Progress Notes (Signed)
Patient did not experience any of the following events: a burn prior to discharge; a fall within the facility; wrong site/side/patient/procedure/implant event; or a hospital transfer or hospital admission upon discharge from the facility. (G8907) Patient did not have preoperative order for IV antibiotic SSI prophylaxis. (G8918)  

## 2012-04-08 ENCOUNTER — Telehealth: Payer: Self-pay

## 2012-04-08 NOTE — Telephone Encounter (Signed)
  Follow up Call-  Call back number 04/07/2012  Post procedure Call Back phone  # [phone] hm  Permission to leave phone message Yes     Patient questions:  Do you have a fever, pain , or abdominal swelling? no Pain Score  0 *  Have you tolerated food without any problems? yes  Have you been able to return to your normal activities? yes  Do you have any questions about your discharge instructions: Diet   no Medications  no Follow up visit  no  Do you have questions or concerns about your Care? no  Actions: * If pain score is 4 or above: No action needed, pain <4.

## 2012-04-13 NOTE — Progress Notes (Signed)
Quick Note:  Office - biopsies ok - still not certain where pain comes from but since Lotronex was relieving it suspect it is from her [name]  If she is doing ok using Lotronex now then would continue with that  If still having problems schedule REV with Dr. [name] - primary GI MD  [facility] - no recall and no letter ______

## 2012-04-19 ENCOUNTER — Other Ambulatory Visit: Payer: Self-pay | Admitting: Gastroenterology

## 2012-04-19 ENCOUNTER — Other Ambulatory Visit: Payer: Self-pay | Admitting: Pulmonary Disease

## 2012-04-20 ENCOUNTER — Other Ambulatory Visit: Payer: Self-pay | Admitting: Gastroenterology

## 2012-04-20 NOTE — Telephone Encounter (Signed)
Forwarded this message to Zella Ball. Rx needs to be printed out with sticker on it and Dr. [name] needs to sign the script. Pt then needs to pick up the script. Robin please take care of this while Dr. [name] is in the [facility] tomorrow.

## 2012-04-21 ENCOUNTER — Ambulatory Visit (INDEPENDENT_AMBULATORY_CARE_PROVIDER_SITE_OTHER): Payer: 59

## 2012-04-21 DIAGNOSIS — D649 Anemia, unspecified: Secondary | ICD-10-CM

## 2012-04-21 MED ORDER — ALOSETRON HCL 1 MG PO TABS: 1.0000 mg | ORAL_TABLET | Freq: Two times a day (BID) | ORAL | Status: DC

## 2012-04-21 NOTE — Telephone Encounter (Signed)
Have printed and put in Dr Nita Sells office for his signiture

## 2012-04-21 NOTE — Telephone Encounter (Signed)
Patient will be here tomorrow to pick up script

## 2012-04-22 MED ORDER — CYANOCOBALAMIN 1000 MCG/ML IJ SOLN
1000.0000 ug | Freq: Once | INTRAMUSCULAR | Status: AC
  Administered 2012-04-22: 1000 ug via INTRAMUSCULAR

## 2012-05-13 ENCOUNTER — Encounter: Payer: Self-pay | Admitting: Gastroenterology

## 2012-05-13 ENCOUNTER — Ambulatory Visit (INDEPENDENT_AMBULATORY_CARE_PROVIDER_SITE_OTHER): Payer: 59

## 2012-05-13 ENCOUNTER — Ambulatory Visit (INDEPENDENT_AMBULATORY_CARE_PROVIDER_SITE_OTHER): Payer: 59 | Admitting: Gastroenterology

## 2012-05-13 VITALS — BP 138/78 | HR 88 | Ht 64.0 in | Wt 130.0 lb

## 2012-05-13 DIAGNOSIS — K589 Irritable bowel syndrome without diarrhea: Secondary | ICD-10-CM

## 2012-05-13 DIAGNOSIS — D649 Anemia, unspecified: Secondary | ICD-10-CM

## 2012-05-13 DIAGNOSIS — R1013 Epigastric pain: Secondary | ICD-10-CM

## 2012-05-13 MED ORDER — CYANOCOBALAMIN 1000 MCG/ML IJ SOLN
1000.0000 ug | Freq: Once | INTRAMUSCULAR | Status: AC
  Administered 2012-05-13: 1000 ug via INTRAMUSCULAR

## 2012-05-13 NOTE — Progress Notes (Signed)
History of Present Illness:  Mrs. Heart returns for followup of abdominal pain and  diarrhea. Gastritis was seen at endoscopy. SShe takes Lotronex almost daily. She's had to reduce the frequency because of occasional constipation. Crampy abdominal pain has decreased as has her diarrhea. She expressed concern about taking protonix because of osteoporosis and because of pernicious anemia.    Review of Systems: Pertinent positive and negative review of systems were noted in the above HPI section. All other review of systems were otherwise negative.    Current Medications, Allergies, Past Medical History, Past Surgical History, Family History and Social History were reviewed in [employer] electronic medical record  Vital signs were reviewed in today's medical record. Physical Exam: General: Well developed , well nourished, no acute distress

## 2012-05-13 NOTE — Patient Instructions (Addendum)
You have been given a separate informational sheet regarding your tobacco use, the importance of quitting and local resources to help you quit.  Follow up as needed 

## 2012-05-13 NOTE — Assessment & Plan Note (Signed)
Reason endoscopy demonstrated mild gastritis. She currently has minimal to no bowel pain.  Recommendations #1 discontinue Protonix in view of her concerns about pernicious anemia and osteoporosis

## 2012-05-13 NOTE — Assessment & Plan Note (Addendum)
Symptoms including abdominal pain and diarrhea  are well controlled with Lotronex. She was advised not to necessarily take the medication daily, especially if she develops constipation.

## 2012-06-27 ENCOUNTER — Ambulatory Visit (INDEPENDENT_AMBULATORY_CARE_PROVIDER_SITE_OTHER): Payer: 59

## 2012-06-27 DIAGNOSIS — D649 Anemia, unspecified: Secondary | ICD-10-CM

## 2012-06-28 DIAGNOSIS — D649 Anemia, unspecified: Secondary | ICD-10-CM

## 2012-06-28 MED ORDER — CYANOCOBALAMIN 1000 MCG/ML IJ SOLN
1000.0000 ug | Freq: Once | INTRAMUSCULAR | Status: AC
  Administered 2012-06-28: 1000 ug via INTRAMUSCULAR

## 2012-07-15 ENCOUNTER — Ambulatory Visit (INDEPENDENT_AMBULATORY_CARE_PROVIDER_SITE_OTHER): Payer: 59 | Admitting: Pulmonary Disease

## 2012-07-15 ENCOUNTER — Encounter: Payer: Self-pay | Admitting: Pulmonary Disease

## 2012-07-15 VITALS — BP 130/90 | HR 88 | Temp 97.0°F | Ht 64.0 in | Wt 127.6 lb

## 2012-07-15 DIAGNOSIS — G894 Chronic pain syndrome: Secondary | ICD-10-CM

## 2012-07-15 DIAGNOSIS — M949 Disorder of cartilage, unspecified: Secondary | ICD-10-CM

## 2012-07-15 DIAGNOSIS — K5730 Diverticulosis of large intestine without perforation or abscess without bleeding: Secondary | ICD-10-CM

## 2012-07-15 DIAGNOSIS — K589 Irritable bowel syndrome without diarrhea: Secondary | ICD-10-CM

## 2012-07-15 DIAGNOSIS — K219 Gastro-esophageal reflux disease without esophagitis: Secondary | ICD-10-CM

## 2012-07-15 DIAGNOSIS — J42 Unspecified chronic bronchitis: Secondary | ICD-10-CM

## 2012-07-15 DIAGNOSIS — I10 Essential (primary) hypertension: Secondary | ICD-10-CM

## 2012-07-15 DIAGNOSIS — D126 Benign neoplasm of colon, unspecified: Secondary | ICD-10-CM

## 2012-07-15 DIAGNOSIS — E538 Deficiency of other specified B group vitamins: Secondary | ICD-10-CM

## 2012-07-15 DIAGNOSIS — F341 Dysthymic disorder: Secondary | ICD-10-CM

## 2012-07-15 DIAGNOSIS — I872 Venous insufficiency (chronic) (peripheral): Secondary | ICD-10-CM

## 2012-07-15 DIAGNOSIS — K2100 Gastro-esophageal reflux disease with esophagitis, without bleeding: Secondary | ICD-10-CM

## 2012-07-15 DIAGNOSIS — D649 Anemia, unspecified: Secondary | ICD-10-CM

## 2012-07-15 DIAGNOSIS — M899 Disorder of bone, unspecified: Secondary | ICD-10-CM

## 2012-07-15 DIAGNOSIS — M549 Dorsalgia, unspecified: Secondary | ICD-10-CM

## 2012-07-15 MED ORDER — CLOTRIMAZOLE-BETAMETHASONE 1-0.05 % EX CREA: TOPICAL_CREAM | CUTANEOUS | Status: DC

## 2012-07-15 MED ORDER — ATENOLOL 25 MG PO TABS: ORAL_TABLET | ORAL | Status: DC

## 2012-07-15 MED ORDER — CYANOCOBALAMIN 1000 MCG/ML IJ SOLN
1000.0000 ug | Freq: Once | INTRAMUSCULAR | Status: AC
  Administered 2012-07-15: 1000 ug via INTRAMUSCULAR

## 2012-07-15 NOTE — Patient Instructions (Addendum)
Today we updated your med list in our EPIC system...    Continue your current medications the same...  We added a low dose of Atenolol- 25mg  tab start 1/2 tab daily for the palpit & increased heart rate...    If necessary you may increase to 1 tab daily after several weeks...  We also wrote for a cream to use on your rash- apply a small amt twice daily...  Call for any questions...  Let's plan a follow up visit in another 4 month interval..Marland Kitchen

## 2012-07-17 ENCOUNTER — Encounter: Payer: Self-pay | Admitting: Pulmonary Disease

## 2012-07-17 NOTE — Progress Notes (Signed)
Subjective:     Patient ID: KAMAYAH PILLAY, female   DOB: 1951-08-07, 61 y.o.   MRN: [id number]  HPI 60 y/o WF here for a follow up visit... she has multiple medical problems as noted below...   ~  August 06, 2009:  had back surg w/ fusion by DrCohen at [facility] in Oct09... much difficulty after surg- out of work til 4/10 (ret part time) and 8/10 (just ret full time)... she is seeing DrPhillips for pain management now on EMBEDA 50-2 Bid + MSIR for breakthrough pain...  ~  February 08, 2010:  recent GI bug & [name] flair w/ left flank pain- stool studies neg & Rx w/ Levsin, empiric Flagyl, Florastor, & Lomotil...  she continues regular f/u in pain clinic w/ DrPhillips- now on OPANA & Cymbalta...  ~  February 03, 2011:  1 yr ROV & she reports lots of GI problems- managed by DrKaplan; and chronic back pain- managed by DrPhillips on OPANA ER 20mg Bid & PERCOCET10- 4 per day...     She has GERD, [name], Divertics, & pos FamHx colon ca in brother;  chr abd pain, bloating, etc w/ mult tests> CT scan, Colonoscopy, Labs, medication trials, etc...    She continues to smoke 1ppd- CXR w/ chr changes, NAD;  denies much cough, sputum, SOB but sedentary due to her back; declines smoking cessation help...    Osteoporosis eval & Rx by her GYN, DrHolland- treated w/ Reclast by her hx (we don't have records)...    She has known B12 defic & she was on monthly B12 shots but for some reason she stopped> f/u B12 level is low at 213 today & she will re-start B12 shots monthly...  ~  January 01, 2012:  778mo ROV & it's been a rough year for [facility] w/ problems in several areas as reviewed below;  She also notes incr BP recently but she believes due to her incr pain level, and notes her pulse is always rapid> we discussed trial BBlocker for this w/ TOPROL XL 25mg /d to start as she is scared of side effects of medications...    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    HBP> not currently on meds & she thinks hi readings at home are due to her pain; BP= 126/90 & pulse= 94 (readings higher at home); we decided on trial of low dose BBlocker ToprolXL 25mg /d as noted above...    GI> managed by DrKaplan & seen every 2-26mo, she is c/o constant pain & diarrhea; now on Lotronex which seems to help the diarrhea but not her pain...    Chr Pain> Hx severe back problems- DrCohen, & pain managed by DrPhillips on Opana20Bid & Percocet10- now taking 5/d; "they can't go higher & pain is getting worse"...    Nerves/ Insomnia> DrKaplan started Xanax 1mg Qhs & she adds OTC Unisom25mg Qhs; we have filled her Rx for Cymbalta 60mg Qhs.  ~  March 30, 2012:  78mo ROV & here for recheck of her BP which is fair at 130/90 on Metoprolol25Bid, Lasix20/d, & K10/s; she notes that the BBlocker "adds another layer of tiredness" she says; we discussed adding LOSARTAN 50mg /d & backing off the Metoprolol to 25mg /d (depending on her response we may be able to incr the Losar & wean the Metop off)...  Her CC today however is abd pain & discomfort w/ nausea "mostly  on left side but really all over abd"; she states this is different than her [name] symptoms; she reports going to [facility] (see note 3/27 by DrDaub) & they felt she had prob diverticulitis & treated her w/ Cipro & Flagyl (CBC was normal w/ Hg=13.1, WBC=10.6 w/ 48% neutrophils, CMet-wnl, Amylase/Lipase-wnl, Urine-clear);  She reports NO BETTER after the antibiotics & reports bloating, constipated, w/ one sm BM daily ("I'm on the BRAT diet");  Past hx severe GERD w/ erosions on Protonix daily & last EGD 1/10 was neg;  Normal gastric emptying scan;  CTAbd 1/11 & 2/11 showed no acute dis just retained stool throughout colon & cyst in liver, kidney, & 8mm cystic lesion of the ventral pancreatic body (no change from 2006); symptoms felt to be secondary to narcotic analgesics she takes for her chronic back pain &  chronic pain syndrome... Note: no divertics on CT Abd, but DrKaplan saw some on colonoscopy 1/12 (otherw unremarkable);  Last procedure was a CT Enterography 11/12 w/o acute process identified (the 8mm pseudocyst in pancreas was seen);  She has PROTONIX, LEVSIN, LOTRONEX, ALIGN, & ZOFRAN...  We discussed Rx w/ Anti-gas medications MYLICON & PHAZYME, and to be sure she's not constip w/ her narcotic analgesics- rec to take Citrate of Magnesia + water, & Dulcolax if needed;  We will arrange for f/u w/ her gastroenterologist...    CT ABD 4/13 repeat==> neg for acute abn, but CBD is dil at 8.36mm (prev6.3mm)- no stones or obstructing mass seen; GB appears normal; incidental findings include some scarring at right base, 6mm left hep cyst, kidneys & pancreas appear normal, osteopenia & post op L4-5 laminectomy & fusion...    EGD 4/13 showed gastritis, bx= neg for HPylori...  ~  July 15, 2012:  5mo ROV & [name] persists w/ mult somatic complaints> dizzy, sleepy, elevHR w/ palpit, nauseated, 4+stress (61y/o daugh w/ severe migraines); we decided to add low dose Atenolol 25mg - 1/2 to 1 tab for the HR & palpit...    She had seen DrDaub at [facility] w/ GI complaints> his eval is reviewed...    She then followed up w/ DrGessner for GI; followed by [facility] 5/13> Abd pain, diarrhea, gastritis on Protonix, [name] on Lotronex;  ?they stopped the Protonix because she was concerned about ospeopenia;     We reviewed prob list, meds, xrays and labs> see below>>          Problem List:  VERTIGO (ICD-780.4) - Hosp 8/08 w/ neg eval and eventually referred to Neurology w/ nothing found; Rx'd w/ Antivert, Phenergan, vestib therapy...  BRONCHITIS, RECURRENT (ICD-491.9) - 1ppd smoker w/ hx recurrent bronchitis... she refuses smoking cessation counseling, Chantix, etc... notes min cough, sputum & SOB- sedentary due to back prob... ~  CXR 2/12 showed normal heart size, COPD w/ sl incr markings, nipple shadows noted...  She c/o heart rate  too high >> we rec trial Aten 25mg - 1/2 to 1 tab daily 7/13...  VENOUS INSUFFICIENCY (ICD-459.81) - she knows to avoid sodium, elevate legs, wear support hose... has LASIX 20mg  Prn w/ KCl .  REFLUX ESOPHAGITIS (ICD-530.11) - EGD 9/05 by DrPerry showed mod severe reflux esoph w/ erosions,  Rx PROTONIX 40mg /d regularly w/ good control, but states she needs it Bid;  She uses ZOFRAN 4mg  as needed for nausea.  DIVERTICULOSIS OF COLON (ICD-562.10), &  [name] (ICD-564.1) - on LEVSIN .125 SL Q4H prn> prev on Levbid & Librax... ~  extensive GI eval by DrKaplan 1/10 for abd pain, [name], etc..Marland Kitchen  neg EGD, neg CTAbd, neg Emptying scan... believed secondary to narcotic analgesics... ~  on-going GI eval by DrKaplan w/ repeat CT Abd 2/11 (stable benign cysts liver/ pancreas/ renal), Colonoscopy 1/12 (sigm divertics & neg biopsies) etc... ~  Continued GI evaluation by DrKaplan in 2012 w/ ER visit 8/12 for [name] w/ N/ V; & Labs/ CT enterography, etc;  LOTRONEX helps her diarrhea + Probiotics. ~  4/13:  Recurrent severe abd pain, bloating, nausea, etc> SEE ABOVE & referred back to GI DrKaplan...  COLONIC POLYPS (ICD-211.3) - prev colonoscopy 2/06 by [name] showed Divertic, otherw neg; f/u 21yrs w/ +fam hx colon ca. ~  as above> f/u colon 1/12 w/ divertics otherw neg.  CALCULUS, KIDNEY (ICD-592.0) - Hx of Kidney Stones & small renal cysts on CT scan & sonar - followed by DrWrenn.  BACK PAIN, LUMBAR (ICD-724.2) - initial eval and rx by [facility] w/ DDD and no surg possible... she's had series of epidural shots from DrStrong without relief... additional opinion from [facility]- surg not recommended... referred to a chronic pain clinic... ~  fall w/ injury 2009 & further eval from DrCohen- she decided to proceed w/ spinal surgery & fusion by DrCohen 10/09 at [facility] hosp... much difficulty post op & now followed in the pain management clinic by DrPhillips. ~  she reports another opinion from DrBranch at [facility]- "he is considering  additional surg"...  CHRONIC PAIN SYNDROME (ICD-338.4) - followed by drPhillips pain clinic... currently on OPANA ER 20mg  Bid & PERCOCET10- up to 5 daily... also on CYMBALTA 60mg /d...  OSTEOPENIA (ICD-733.90) - she was prev on EVISTA, Ca++, Vits (w/ BMD per GYN)... she stopped the Evista due to nausea which she feels is better off this med... f/u by [facility] & pt reports osteoporosis w/ RECLAST infusion... ~  labs 3/09 showed Vit d level = 12... started on Vit D 50000 u weekly. ~  labs 6/10 showed Vit D level = 49... OK to switch to 1000 u OTC daily. ~  f/u GYN eval and BMD at [facility] office> she reports Rx w/ RECLAST for osteoposis (we don't have records).  HEADACHE (ICD-784.0)  ANXIETY (ICD-300.00) - prev on Lexapro & Xanax, but these were stopped in favor of CYMBALTA 60mg /d; prev on Lunesta but DrKaplan switched her to [facility] 1mg Qhs & she added Unisom 25mg  OTC...  VITAMIN B12 DEFICIENCY (ICD-266.2) -  ~  labs 1010 showed Vit B12 level = 118 & started on B 12 shots monthly...  ~  labs 2/11 showed Vit B12 level = >1500... for some reason she stopped the B12 shots in 2011. ~  labs 2/12 showed Vit B12 level = 213... rec to resume B12 shots monthly. ~  Labs 8/12 by [name] showed Vit B12 level = 517, Folate = 6.1 ~  Labs 3/13 showed B12 level = 361  HEALTH MAINTENANCE:  GYN= DrHolland & PAP, Mammogram, etc... all up-to-date; she is due a BMD in 1/10; and colon 10/11...   Past Surgical History  Procedure Date  . Appendectomy   . Tubal ligation   . Back surgery 10-09    Dr. Noel Gerold  . Tonsillectomy     Outpatient Encounter Prescriptions as of 07/15/2012  Medication Sig Dispense Refill  . alosetron (LOTRONEX) 1 MG tablet Take 1 mg by mouth daily.      . cyanocobalamin (,VITAMIN B-12,) 1000 MCG/ML injection Inject 1,000 mcg into the muscle every 30 (thirty) days.      . Doxylamine Succinate, Sleep, (UNISOM) 25 MG tablet Take 25  mg by mouth at bedtime as needed.        . DULoxetine  (CYMBALTA) 60 MG capsule Take 1 capsule (60 mg total) by mouth daily.  30 capsule  11  . furosemide (LASIX) 20 MG tablet Take 20 mg by mouth daily as needed.       Marland Kitchen losartan (COZAAR) 50 MG tablet Take 1 tablet (50 mg total) by mouth daily.  30 tablet  11  . Multiple Vitamins-Minerals (MULTIVITAMIN WITH MINERALS) tablet Take 1 tablet by mouth daily.        . ondansetron (ZOFRAN) 4 MG tablet Take 1 tablet (4 mg total) by mouth every 4 (four) hours as needed.  30 tablet  5  . oxyCODONE-acetaminophen (PERCOCET) 10-325 MG per tablet Take 1 tablet by mouth up to 5 times daily as directed by Dr. Vear Clock      . oxymorphone (OPANA ER) 20 MG 12 hr tablet Take 20 mg by mouth every 12 (twelve) hours. As directed by Dr. Vear Clock.       . potassium chloride (KLOR-CON) 10 MEQ CR tablet Take 10 mEq by mouth daily as needed. Take with lasix      . XANAX 0.5 MG tablet TAKE 1/2 TO 1 TABLET THREE TIMES DAILY AS NEEDED.  90 each  5  . zoledronic acid (RECLAST) 5 MG/100ML SOLN Inject 5 mg into the vein once. yearly       . atenolol (TENORMIN) 25 MG tablet Take 1/2 - 1  Tablet by mouth daily  30 tablet  6  . clotrimazole-betamethasone (LOTRISONE) cream Apply to affected area 2 times daily  45 g  6   Facility-Administered Encounter Medications as of 07/15/2012  Medication Dose Route Frequency Provider Last Rate Last Dose  . 0.9 %  sodium chloride infusion  500 mL Intravenous Continuous Iva Boop, MD      . cyanocobalamin ((VITAMIN B-12)) injection 1,000 mcg  1,000 mcg Intramuscular Once Michele Mcalpine, MD   1,000 mcg at 07/15/12 1438    Allergies  Allergen Reactions  . Atorvastatin     REACTION: pt states "nausea \\T \ vomiting"  . Levofloxacin     Reaction: achilles tendon pain  . Sulfonamide Derivatives     REACTION: pt states "rash"  . Trazodone And Nefazodone     "Felt like I was going to faint"    Current Medications, Allergies, Past Medical History, Past Surgical History, Family History, and Social  History were reviewed in [employer] record.    Review of Systems        See HPI - all other systems neg except as noted...  The patient complains of dyspnea on exertion, headaches, abdominal pain, and depression.  The patient denies anorexia, fever, weight loss, weight gain, vision loss, decreased hearing, hoarseness, chest pain, syncope, peripheral edema, prolonged cough, hemoptysis, melena, hematochezia, severe indigestion/heartburn, hematuria, incontinence, muscle weakness, suspicious skin lesions, transient blindness, difficulty walking, unusual weight change, abnormal bleeding, enlarged lymph nodes, and angioedema.     Objective:   Physical Exam     WD, WN, 61 y/o WF in NAD... GENERAL:  Alert & oriented; pleasant & cooperative... HEENT:  [place]/AT,  EACs-clear, TMs-wnl, NOSE-clear, THROAT-clear but dryMMs. NECK:  Supple w/ fairROM; no JVD; normal carotid impulses w/o bruits; no thyromegaly or nodules palpated; no lymphadenopathy. CHEST:  Clear to P & A; without wheezes/ rales/ or rhonchi heard... HEART:  Regular Rhythm; without murmurs/ rubs/ or gallops detected... ABDOMEN:  Soft & mild tender  diffusely in abd; normal bowel sounds; no organomegaly or masses palpated... EXT: without deformities, mild arthritic changes; no varicose veins/ +venous insuffic/ no edema.,  limp w/ walking.  BACK:  c/o mod severe back pain... NEURO:  no focal neuro deficits... DERM:  no lesions seen...  RADIOLOGY DATA:  Reviewed in the EPIC EMR & discussed w/ the patient...    >>Last CXR 2/12 showed underlying COPD w/ sl incr markings, NAD...    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  LABORATORY DATA:  Reviewed in the EPIC EMR & discussed w/ the patient...   Assessment:     Abd Pain, Bloating, etc>> GERD, Divertics, [name], +FamHx colon ca>  On Protonix, Levsin, Lotronex, Zofran, Align; we discussed trying Anti-gas medication w/ MYLICON & PHAZYME,  plus r/o incr fecal burden/constipation w/ laxative regimen (MagCitrate + Dulcolax); with her degree of severity & complexity she needs subspecialty involvement I believe & we will set up appt w/ GI ==> seen by DrGessner & DrKaplan...  HBP & elev HR>  We decided to try a diff BBlocker- Aten25mg  (1/2 to 1 tab daily)...  Smoker, Chr Bronchitis>  Must quit all smoking but she refuses smoking cessation help; doesn't feel her breathing is bad enough for chronic meds/ inhalers/ etc...  Ven Insuffic>  On low sodium diet & has Lasix20 + K10 to use as needed...  Hx Kidney Stones>  Followed by DrWrenn...  DJD, LBP>  Followed by [facility] & DrCohen who did surg in [place] 10/09 (she has also seen DrNudelman & DrBranch at [facility])...  Chronic Pain Syndrome>  Managed by DrPhillips pain clinic on meds as noted...  Anxiety & Insomnia>  On meds listed...  Vit B12 Defic>  On B12 shots monthly...     Plan:     Patient's Medications  New Prescriptions   ATENOLOL (TENORMIN) 25 MG TABLET    Take 1/2 - 1  Tablet by mouth daily   CLOTRIMAZOLE-BETAMETHASONE (LOTRISONE) CREAM    Apply to affected area 2 times daily  Previous Medications   ALOSETRON (LOTRONEX) 1 MG TABLET    Take 1 mg by mouth daily.   CYANOCOBALAMIN (,VITAMIN B-12,) 1000 MCG/ML INJECTION    Inject 1,000 mcg into the muscle every 30 (thirty) days.   DOXYLAMINE SUCCINATE, SLEEP, (UNISOM) 25 MG TABLET    Take 25 mg by mouth at bedtime as needed.     DULOXETINE (CYMBALTA) 60 MG CAPSULE    Take 1 capsule (60 mg total) by mouth daily.   FUROSEMIDE (LASIX) 20 MG TABLET    Take 20 mg by mouth daily as needed.    LOSARTAN (COZAAR) 50 MG TABLET    Take 1 tablet (50 mg total) by mouth daily.   MULTIPLE VITAMINS-MINERALS (MULTIVITAMIN WITH MINERALS) TABLET    Take 1 tablet by mouth daily.     ONDANSETRON (ZOFRAN) 4 MG TABLET    Take 1 tablet (4 mg total) by mouth every 4 (four) hours as needed.   OXYCODONE-ACETAMINOPHEN (PERCOCET) 10-325 MG PER TABLET    Take 1  tablet by mouth up to 5 times daily as directed by Dr. Vear Clock   OXYMORPHONE [facility] ER) 20 MG 12 HR TABLET    Take 20 mg by mouth every 12 (twelve) hours. As directed by Dr. Vear Clock.    POTASSIUM CHLORIDE (KLOR-CON) 10 MEQ CR TABLET    Take 10 mEq by mouth daily as needed. Take with lasix   XANAX 0.5 MG TABLET  TAKE 1/2 TO 1 TABLET THREE TIMES DAILY AS NEEDED.   ZOLEDRONIC ACID (RECLAST) 5 MG/100ML SOLN    Inject 5 mg into the vein once. yearly   Modified Medications   No medications on file  Discontinued Medications   No medications on file

## 2012-08-31 ENCOUNTER — Ambulatory Visit (INDEPENDENT_AMBULATORY_CARE_PROVIDER_SITE_OTHER): Payer: 59

## 2012-08-31 DIAGNOSIS — D649 Anemia, unspecified: Secondary | ICD-10-CM

## 2012-08-31 MED ORDER — CYANOCOBALAMIN 1000 MCG/ML IJ SOLN
1000.0000 ug | Freq: Once | INTRAMUSCULAR | Status: AC
  Administered 2012-08-31: 1000 ug via INTRAMUSCULAR

## 2012-09-29 ENCOUNTER — Ambulatory Visit (INDEPENDENT_AMBULATORY_CARE_PROVIDER_SITE_OTHER): Payer: 59

## 2012-09-29 DIAGNOSIS — D649 Anemia, unspecified: Secondary | ICD-10-CM

## 2012-09-29 DIAGNOSIS — Z23 Encounter for immunization: Secondary | ICD-10-CM

## 2012-09-30 DIAGNOSIS — Z23 Encounter for immunization: Secondary | ICD-10-CM

## 2012-09-30 MED ORDER — CYANOCOBALAMIN 1000 MCG/ML IJ SOLN
1000.0000 ug | Freq: Once | INTRAMUSCULAR | Status: AC
  Administered 2012-09-30: 1000 ug via INTRAMUSCULAR

## 2012-10-07 ENCOUNTER — Telehealth: Payer: Self-pay | Admitting: Pulmonary Disease

## 2012-10-07 MED ORDER — ALPRAZOLAM 0.5 MG PO TABS: ORAL_TABLET | ORAL | Status: DC

## 2012-10-07 NOTE — Telephone Encounter (Signed)
Pt aware RX has been called in. Nothing further was needed

## 2012-10-24 IMAGING — CR DG CHEST 2V
2 series · 2 of 2 positions shown · non-contrast
Comparison: 07/16/2010

CLINICAL DATA: Physical.  No chest complaints.  History of smoking.

CHEST - 2 VIEW

[view not recorded (1 of 2)]
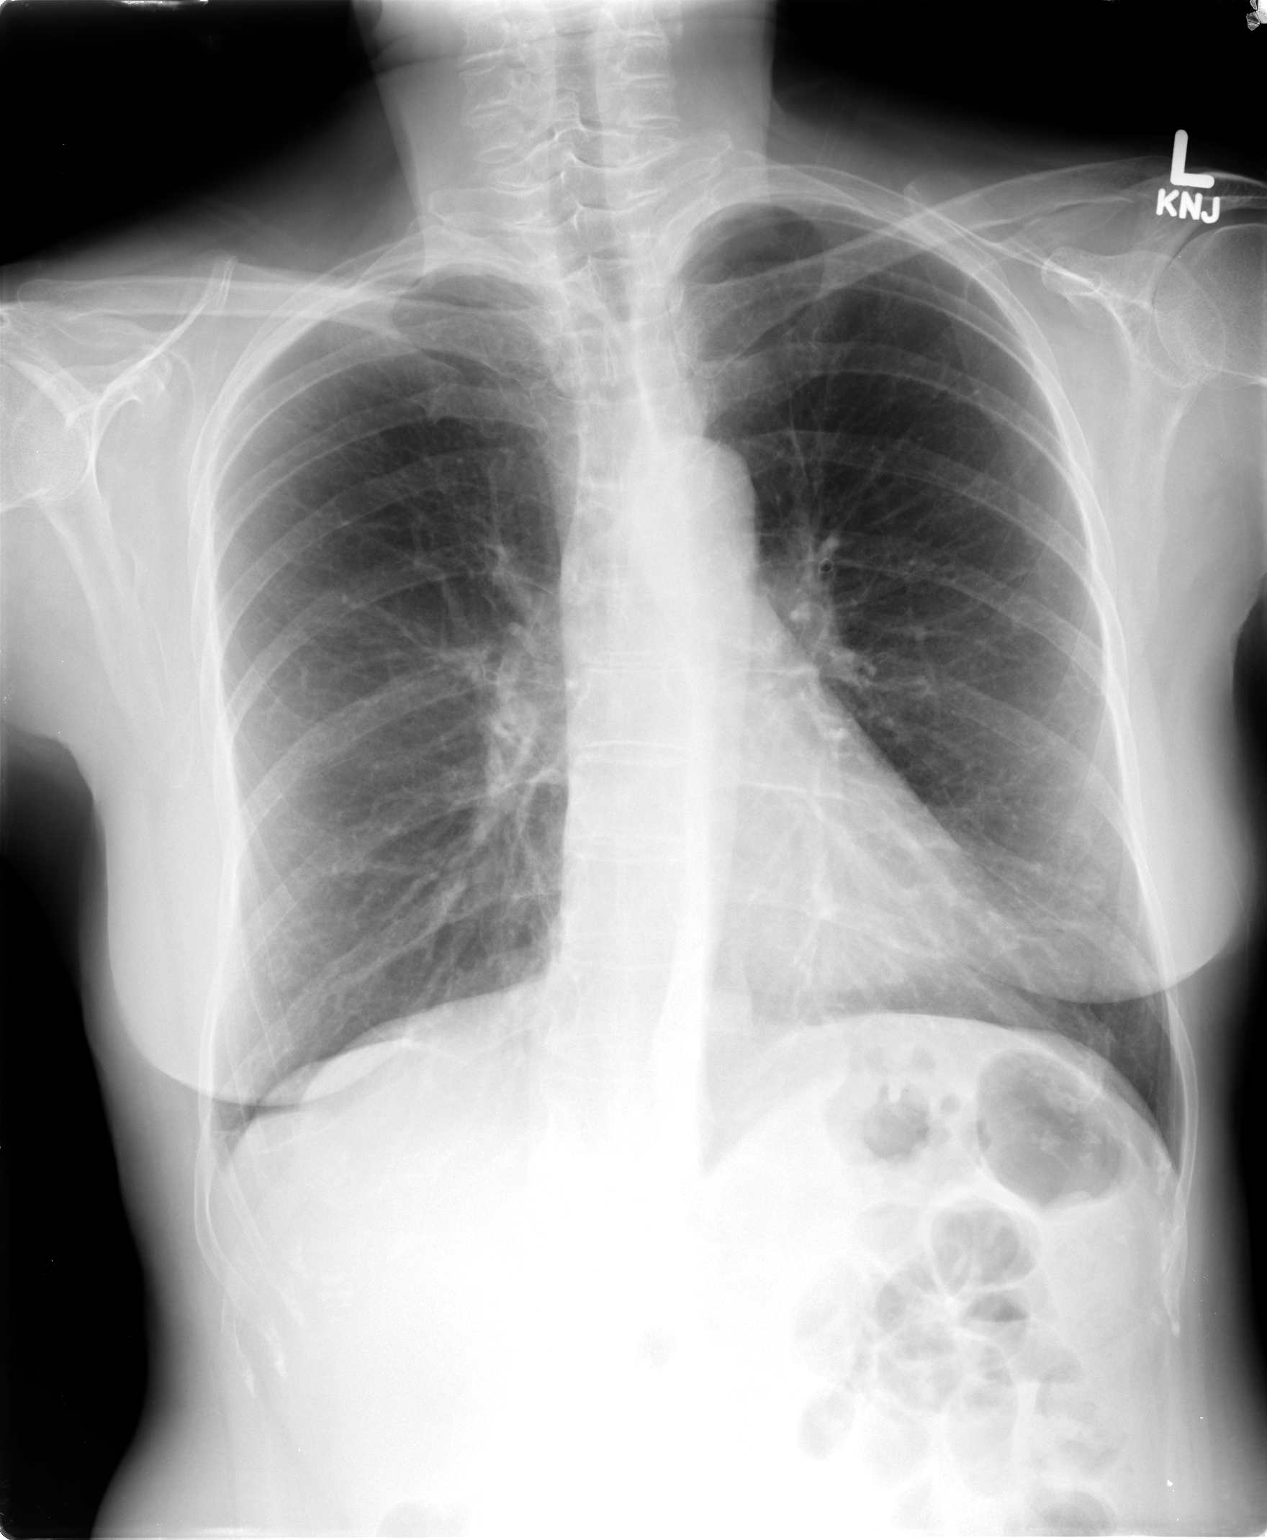

[view not recorded (2 of 2)]
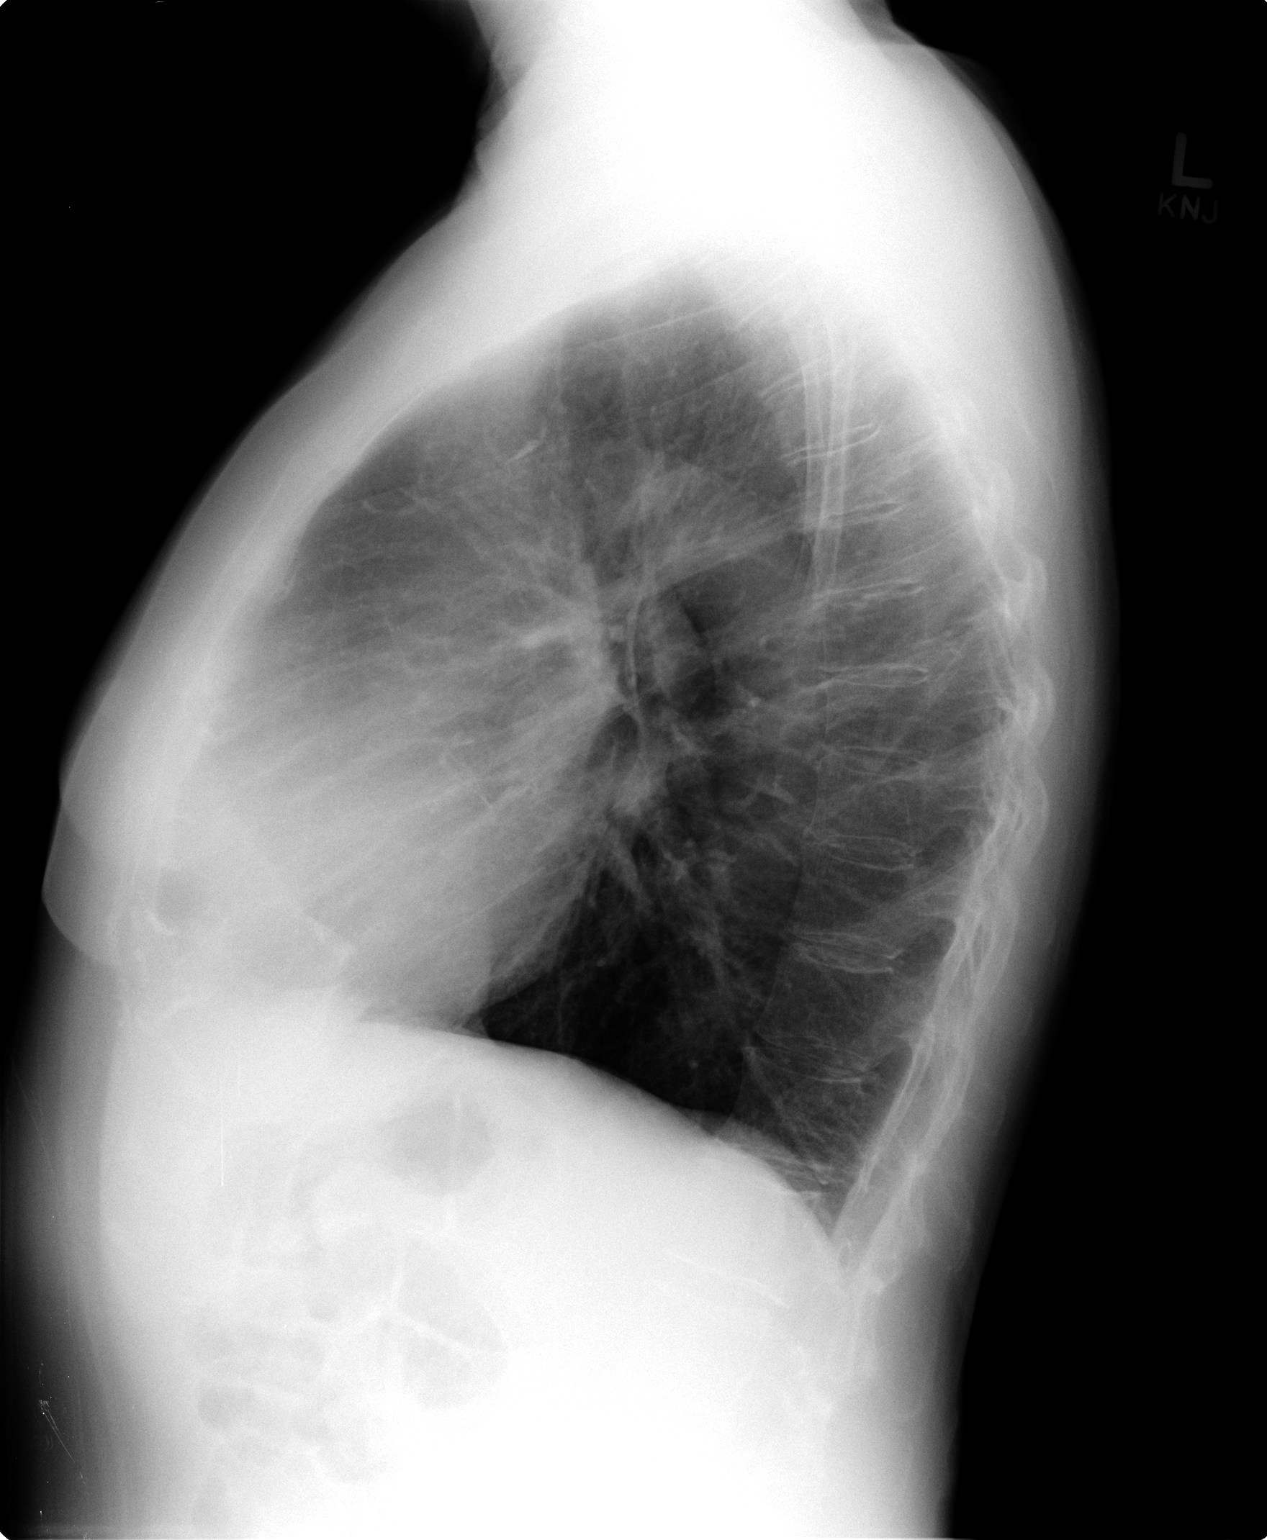

[2 of 2 positions shown; findings below may reference images not displayed]

FINDINGS: The lungs are hyperexpanded and there is mild, diffuse
bronchial wall thickening noted.  Prominent nipple  shadows are
seen bilaterally, unchanged.  No confluent airspace opacities,
edema or effusions are seen.  The heart is normal in size.  The
upper abdomen is normal.  Apex right scoliosis is seen centered in
the upper lumbar spine.
IMPRESSION: Hyperexpansion and bronchial wall thickening which can be seen with
acute or chronic bronchitis.

## 2012-10-26 ENCOUNTER — Telehealth: Payer: Self-pay | Admitting: Pulmonary Disease

## 2012-10-26 NOTE — Telephone Encounter (Signed)
ATC pt at number provided above -- rang several times then received a busy signal.  [facility]

## 2012-10-27 ENCOUNTER — Encounter: Payer: Self-pay | Admitting: *Deleted

## 2012-10-27 ENCOUNTER — Ambulatory Visit (INDEPENDENT_AMBULATORY_CARE_PROVIDER_SITE_OTHER): Payer: 59

## 2012-10-27 ENCOUNTER — Ambulatory Visit (INDEPENDENT_AMBULATORY_CARE_PROVIDER_SITE_OTHER): Payer: 59 | Admitting: Adult Health

## 2012-10-27 ENCOUNTER — Encounter: Payer: Self-pay | Admitting: Adult Health

## 2012-10-27 VITALS — BP 132/76 | HR 101 | Temp 97.6°F | Ht 64.0 in | Wt 127.4 lb

## 2012-10-27 DIAGNOSIS — H6690 Otitis media, unspecified, unspecified ear: Secondary | ICD-10-CM | POA: Insufficient documentation

## 2012-10-27 DIAGNOSIS — E538 Deficiency of other specified B group vitamins: Secondary | ICD-10-CM

## 2012-10-27 MED ORDER — CEFDINIR 300 MG PO CAPS: 300.0000 mg | ORAL_CAPSULE | Freq: Two times a day (BID) | ORAL | Status: DC

## 2012-10-27 NOTE — Patient Instructions (Addendum)
Omnicef 300mg  Twice daily  For 7 days  Zyrtec 10mg  daily for 1 week then As needed  Drainage  Fluids and rest  Please contact office for sooner follow up if symptoms do not improve or worsen or seek emergency care

## 2012-10-27 NOTE — Progress Notes (Signed)
Subjective:     Patient ID: Denise King, female   DOB: [birth year], 61 y.o.   MRN: [id number]  HPI 61 y/o WF    ~  August 06, 2009:  had back surg w/ fusion by DrCohen at [facility] in Oct09... much difficulty after surg- out of work til 4/10 (ret part time) and 8/10 (just ret full time)... she is seeing DrPhillips for pain management now on EMBEDA 50-2 Bid + MSIR for breakthrough pain...  ~  February 08, 2010:  recent GI bug & [name] flair w/ left flank pain- stool studies neg & Rx w/ Levsin, empiric Flagyl, Florastor, & Lomotil...  she continues regular f/u in pain clinic w/ DrPhillips- now on OPANA & Cymbalta...  ~  February 03, 2011:  1 yr ROV & she reports lots of GI problems- managed by DrKaplan; and chronic back pain- managed by DrPhillips on OPANA ER 20mg Bid & PERCOCET10- 4 per day...     She has GERD, [name], Divertics, & pos FamHx colon ca in brother;  chr abd pain, bloating, etc w/ mult tests> CT scan, Colonoscopy, Labs, medication trials, etc...    She continues to smoke 1ppd- CXR w/ chr changes, NAD;  denies much cough, sputum, SOB but sedentary due to her back; declines smoking cessation help...    Osteoporosis eval & Rx by her GYN, DrHolland- treated w/ Reclast by her hx (we don't have records)...    She has known B12 defic & she was on monthly B12 shots but for some reason she stopped> f/u B12 level is low at 213 today & she will re-start B12 shots monthly...  ~  January 01, 2012:  22mo ROV & it's been a rough year for Denise King w/ problems in several areas as reviewed below;  She also notes incr BP recently but she believes due to her incr pain level, and notes her pulse is always rapid> we discussed trial BBlocker for this w/ TOPROL XL 25mg /d to start as she is scared of side effects of medications...    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    HBP> not currently on  meds & she thinks hi readings at home are due to her pain; BP= 126/90 & pulse= 94 (readings higher at home); we decided on trial of low dose BBlocker ToprolXL 25mg /d as noted above...    GI> managed by DrKaplan & seen every 2-85mo, she is c/o constant pain & diarrhea; now on Lotronex which seems to help the diarrhea but not her pain...    Chr Pain> Hx severe back problems- DrCohen, & pain managed by DrPhillips on Opana20Bid & Percocet10- now taking 5/d; "they can't go higher & pain is getting worse"...    Nerves/ Insomnia> DrKaplan started Xanax 1mg Qhs & she adds OTC Unisom25mg Qhs; we have filled her Rx for Cymbalta 60mg Qhs.  ~  March 30, 2012:  63mo ROV & here for recheck of her BP which is fair at 130/90 on Metoprolol25Bid, Lasix20/d, & K10/s; she notes that the BBlocker "adds another layer of tiredness" she says; we discussed adding LOSARTAN 50mg /d & backing off the Metoprolol to 25mg /d (depending on her response we may be able to incr the Losar & wean the Metop off)...  Her CC today however is abd pain & discomfort w/ nausea "mostly on left side but really all over abd"; she states this is different  than her [name] symptoms; she reports going to [facility] (see note 3/27 by DrDaub) & they felt she had prob diverticulitis & treated her w/ Cipro & Flagyl (CBC was normal w/ Hg=13.1, WBC=10.6 w/ 48% neutrophils, CMet-wnl, Amylase/Lipase-wnl, Urine-clear);  She reports NO BETTER after the antibiotics & reports bloating, constipated, w/ one sm BM daily ("I'm on the BRAT diet");  Past hx severe GERD w/ erosions on Protonix daily & last EGD 1/10 was neg;  Normal gastric emptying scan;  CTAbd 1/11 & 2/11 showed no acute dis just retained stool throughout colon & cyst in liver, kidney, & 8mm cystic lesion of the ventral pancreatic body (no change from 2006); symptoms felt to be secondary to narcotic analgesics she takes for her chronic back pain & chronic pain syndrome... Note: no divertics on CT Abd, but DrKaplan saw some on  colonoscopy 1/12 (otherw unremarkable);  Last procedure was a CT Enterography 11/12 w/o acute process identified (the 8mm pseudocyst in pancreas was seen);  She has PROTONIX, LEVSIN, LOTRONEX, ALIGN, & ZOFRAN...  We discussed Rx w/ Anti-gas medications MYLICON & PHAZYME, and to be sure she's not constip w/ her narcotic analgesics- rec to take Citrate of Magnesia + water, & Dulcolax if needed;  We will arrange for f/u w/ her gastroenterologist...    CT ABD 4/13 repeat==> neg for acute abn, but CBD is dil at 8.23mm (prev6.3mm)- no stones or obstructing mass seen; GB appears normal; incidental findings include some scarring at right base, 6mm left hep cyst, kidneys & pancreas appear normal, osteopenia & post op L4-5 laminectomy & fusion...    EGD 4/13 showed gastritis, bx= neg for HPylori...  ~  July 15, 2012:  39mo ROV & Denise King persists w/ mult somatic complaints> dizzy, sleepy, elevHR w/ palpit, nauseated, 4+stress (61y/o daugh w/ severe migraines); we decided to add low dose Atenolol 25mg - 1/2 to 1 tab for the HR & palpit...    She had seen DrDaub at [facility] w/ GI complaints> his eval is reviewed...    She then followed up w/ DrGessner for GI; followed by [facility] 5/13> Abd pain, diarrhea, gastritis on Protonix, [name] on Lotronex;  ?they stopped the Protonix because she was concerned about ospeopenia;     We reviewed prob list, meds, xrays and labs> see below>>  10/27/2012 Acute OV  Complains of head congestion, sore throat, glands on left side hurt, left ear ache, burns when she swallows x1 week .  OTC  not helping .  Missed work x 4 days  Feels tired.  No fever or injury .  No rash .            Problem List:  VERTIGO (ICD-780.4) - Hosp 8/08 w/ neg eval and eventually referred to Neurology w/ nothing found; Rx'd w/ Antivert, Phenergan, vestib therapy...  BRONCHITIS, RECURRENT (ICD-491.9) - 1ppd smoker w/ hx recurrent bronchitis... she refuses smoking cessation counseling, Chantix, etc... notes min  cough, sputum & SOB- sedentary due to back prob... ~  CXR 2/12 showed normal heart size, COPD w/ sl incr markings, nipple shadows noted...  She c/o heart rate too high >> we rec trial Aten 25mg - 1/2 to 1 tab daily 7/13...  VENOUS INSUFFICIENCY (ICD-459.81) - she knows to avoid sodium, elevate legs, wear support hose... has LASIX 20mg  Prn w/ KCl .  REFLUX ESOPHAGITIS (ICD-530.11) - EGD 9/05 by DrPerry showed mod severe reflux esoph w/ erosions,  Rx PROTONIX 40mg /d regularly w/ good control, but states she needs it Bid;  She uses  ZOFRAN 4mg  as needed for nausea.  DIVERTICULOSIS OF COLON (ICD-562.10), &  [name] (ICD-564.1) - on LEVSIN .125 SL Q4H prn> prev on Levbid & Librax... ~  extensive GI eval by DrKaplan 1/10 for abd pain, [name], etc... neg EGD, neg CTAbd, neg Emptying scan... believed secondary to narcotic analgesics... ~  on-going GI eval by DrKaplan w/ repeat CT Abd 2/11 (stable benign cysts liver/ pancreas/ renal), Colonoscopy 1/12 (sigm divertics & neg biopsies) etc... ~  Continued GI evaluation by DrKaplan in 2012 w/ ER visit 8/12 for [name] w/ N/ V; & Labs/ CT enterography, etc;  LOTRONEX helps her diarrhea + Probiotics. ~  4/13:  Recurrent severe abd pain, bloating, nausea, etc> SEE ABOVE & referred back to GI DrKaplan...  COLONIC POLYPS (ICD-211.3) - prev colonoscopy 2/06 by [name] showed Divertic, otherw neg; f/u 16yrs w/ +fam hx colon ca. ~  as above> f/u colon 1/12 w/ divertics otherw neg.  CALCULUS, KIDNEY (ICD-592.0) - Hx of Kidney Stones & small renal cysts on CT scan & sonar - followed by DrWrenn.  BACK PAIN, LUMBAR (ICD-724.2) - initial eval and rx by [facility] w/ DDD and no surg possible... she's had series of epidural shots from DrStrong without relief... additional opinion from [facility]- surg not recommended... referred to a chronic pain clinic... ~  fall w/ injury 2009 & further eval from DrCohen- she decided to proceed w/ spinal surgery & fusion by DrCohen 10/09 at [facility]... much difficulty post op & now followed in the pain management clinic by DrPhillips. ~  she reports another opinion from DrBranch at [facility]- "he is considering additional surg"...  CHRONIC PAIN SYNDROME (ICD-338.4) - followed by drPhillips pain clinic... currently on OPANA ER 20mg  Bid & PERCOCET10- up to 5 daily... also on CYMBALTA 60mg /d...  OSTEOPENIA (ICD-733.90) - she was prev on EVISTA, Ca++, Vits (w/ BMD per GYN)... she stopped the Evista due to nausea which she feels is better off this med... f/u by [facility] & pt reports osteoporosis w/ RECLAST infusion... ~  labs 3/09 showed Vit d level = 12... started on Vit D 50000 u weekly. ~  labs 6/10 showed Vit D level = 49... OK to switch to 1000 u OTC daily. ~  f/u GYN eval and BMD at [facility] office> she reports Rx w/ RECLAST for osteoposis (we don't have records).  HEADACHE (ICD-784.0)  ANXIETY (ICD-300.00) - prev on Lexapro & Xanax, but these were stopped in favor of CYMBALTA 60mg /d; prev on Lunesta but DrKaplan switched her to [facility] 1mg Qhs & she added Unisom 25mg  OTC...  VITAMIN B12 DEFICIENCY (ICD-266.2) -  ~  labs 1010 showed Vit B12 level = 118 & started on B 12 shots monthly...  ~  labs 2/11 showed Vit B12 level = >1500... for some reason she stopped the B12 shots in 2011. ~  labs 2/12 showed Vit B12 level = 213... rec to resume B12 shots monthly. ~  Labs 8/12 by [name] showed Vit B12 level = 517, Folate = 6.1 ~  Labs 3/13 showed B12 level = 361  HEALTH MAINTENANCE:  GYN= DrHolland & PAP, Mammogram, etc... all up-to-date; she is due a BMD in 1/10; and colon 10/11...   Past Surgical History  Procedure Date  . Appendectomy   . Tubal ligation   . Back surgery 10-09    Dr. Noel Gerold  . Tonsillectomy     Outpatient Encounter Prescriptions as of 10/27/2012  Medication Sig Dispense Refill  . alosetron (LOTRONEX) 1 MG tablet Take 1 mg  by mouth daily.      Marland Kitchen ALPRAZolam (XANAX) 0.5 MG tablet 1/2-1 tablet 3 times a day as needed   90 tablet  1  . atenolol (TENORMIN) 25 MG tablet Take 1/2 - 1  Tablet by mouth daily  30 tablet  6  . chlorzoxazone (PARAFON) 500 MG tablet Take 1 tablet by mouth Three times daily as needed.      . clotrimazole-betamethasone (LOTRISONE) cream Apply to affected area 2 times daily  45 g  6  . cyanocobalamin (,VITAMIN B-12,) 1000 MCG/ML injection Inject 1,000 mcg into the muscle every 30 (thirty) days.      . Doxylamine Succinate, Sleep, (UNISOM) 25 MG tablet Take 25 mg by mouth at bedtime as needed.        . DULoxetine (CYMBALTA) 60 MG capsule Take 1 capsule (60 mg total) by mouth daily.  30 capsule  11  . furosemide (LASIX) 20 MG tablet Take 20 mg by mouth daily as needed.       . hyoscyamine (LEVSIN SL) 0.125 MG SL tablet Place 1 tablet under the tongue Every 4 hours as needed.      Marland Kitchen losartan (COZAAR) 50 MG tablet Take 1 tablet (50 mg total) by mouth daily.  30 tablet  11  . Multiple Vitamins-Minerals (MULTIVITAMIN WITH MINERALS) tablet Take 1 tablet by mouth daily.        . ondansetron (ZOFRAN) 4 MG tablet Take 1 tablet (4 mg total) by mouth every 4 (four) hours as needed.  30 tablet  5  . oxyCODONE-acetaminophen (PERCOCET) 10-325 MG per tablet Take 1 tablet by mouth up to 5 times daily as directed by Dr. Vear Clock      . oxymorphone (OPANA ER) 20 MG 12 hr tablet Take 20 mg by mouth every 12 (twelve) hours. As directed by Dr. Vear Clock.       . potassium chloride (KLOR-CON) 10 MEQ CR tablet Take 10 mEq by mouth daily as needed. Take with lasix      . zoledronic acid (RECLAST) 5 MG/100ML SOLN Inject 5 mg into the vein once. yearly       . cefdinir (OMNICEF) 300 MG capsule Take 1 capsule (300 mg total) by mouth 2 (two) times daily.  14 capsule  0   Facility-Administered Encounter Medications as of 10/27/2012  Medication Dose Route Frequency Provider Last Rate Last Dose  . DISCONTD: 0.9 %  sodium chloride infusion  500 mL Intravenous Continuous Iva Boop, MD        Allergies  Allergen  Reactions  . Atorvastatin     REACTION: pt states "nausea \\T \ vomiting"  . Levofloxacin     Reaction: achilles tendon pain  . Sulfonamide Derivatives     REACTION: pt states "rash"  . Trazodone And Nefazodone     "Felt like I was going to faint"    Current Medications, Allergies, Past Medical History, Past Surgical History, Family History, and Social History were reviewed in [employer] record.    Review of Systems        Constitutional:   No  weight loss, night sweats,  Fevers, chills,  +fatigue, or  lassitude.  HEENT:   No headaches,  Difficulty swallowing,  Tooth/dental problems,                 No sneezing, itching,  nasal congestion, post nasal drip,   CV:  No chest pain,  Orthopnea, PND, swelling in lower extremities, anasarca, dizziness, palpitations, syncope.  GI  No heartburn, indigestion, abdominal pain, nausea, vomiting, diarrhea, change in bowel habits, loss of appetite, bloody stools.   Resp:    No coughing up of blood.  No change in color of mucus.  No wheezing.  No chest wall deformity  Skin: no rash or lesions.  GU: no dysuria, change in color of urine, no urgency or frequency.  No flank pain, no hematuria   MS:  No joint pain or swelling.  No decreased range of motion.  No back pain.  Psych:  No change in mood or affect. No depression or anxiety.  No memory loss.       Objective:   Physical Exam     WD, WN, 61 y/o WF in NAD... GENERAL:  Alert & oriented; pleasant & cooperative... HEENT:  [place]/AT,  EACs-clear, TMs: full L>R  Swollen and red on left , NOSE-clear, THROAT-clear drainage  NECK:  Supple w/ fairROM; no JVD; normal carotid impulses w/o bruits; no thyromegaly or nodules palpated; cervical adenopathy on left  CHEST:  Clear to P & A; without wheezes/ rales/ or rhonchi heard... HEART:  Regular Rhythm; without murmurs/ rubs/ or gallops detected... ABDOMEN:  Soft & normal bowel sounds; no organomegaly or masses  palpated... EXT: without deformities, mild arthritic changes; no varicose veins/ +venous insuffic/ no edema.,  limp w/ walking.   NEURO:  no focal neuro deficits... DERM:  no lesions seen...    Assessment:

## 2012-10-27 NOTE — Telephone Encounter (Signed)
Verified with the pt's spouse that pt plans to see TP today at 10am.

## 2012-10-27 NOTE — Assessment & Plan Note (Signed)
Left OM and URI   Plan Omnicef 300mg  Twice daily  For 7 days  Zyrtec 10mg  daily for 1 week then As needed  Drainage  Fluids and rest  Please contact office for sooner follow up if symptoms do not improve or worsen or seek emergency care

## 2012-10-28 DIAGNOSIS — E538 Deficiency of other specified B group vitamins: Secondary | ICD-10-CM

## 2012-10-28 MED ORDER — CYANOCOBALAMIN 1000 MCG/ML IJ SOLN
1000.0000 ug | Freq: Once | INTRAMUSCULAR | Status: AC
  Administered 2012-10-28: 1000 ug via INTRAMUSCULAR

## 2012-10-31 ENCOUNTER — Telehealth: Payer: Self-pay | Admitting: Pulmonary Disease

## 2012-10-31 NOTE — Telephone Encounter (Signed)
Pt is aware of TP recs and verbalized understanding. She will call if her sxs do not improve or get worse.

## 2012-10-31 NOTE — Telephone Encounter (Signed)
I spoke with pt and she stated she is having an allergic reaction to omnicef giving to her her on 10/27/12. C/o severe diarrhea, severe abdominal pain, and has not eating in 3 days. Per pt this started Friday. She is even having diarrhea in her sleep/ she is requesting to have something else called in for her. She did not take any probiotic OTC. Please advise TP thanks  Allergies  Allergen Reactions  . Atorvastatin     REACTION: pt states "nausea \\T \ vomiting"  . Levofloxacin     Reaction: achilles tendon pain  . Sulfonamide Derivatives     REACTION: pt states "rash"  . Trazodone And Nefazodone     "Felt like I was going to faint"

## 2012-10-31 NOTE — Telephone Encounter (Signed)
Stop abx .  She has had almost 5 days of abx  Should cover ear infection Would stop all abx  Begin probiotic daily x 2 weeks  Eat yogurt daliy  Advance clear liquid diet .  Drink plenty of fluids w/ propel .  If not improving will need ov  Please contact office for sooner follow up if symptoms do not improve or worsen or seek emergency care

## 2012-11-27 DIAGNOSIS — Z87898 Personal history of other specified conditions: Secondary | ICD-10-CM

## 2012-11-27 HISTORY — DX: Personal history of other specified conditions: Z87.898

## 2012-12-26 ENCOUNTER — Ambulatory Visit (INDEPENDENT_AMBULATORY_CARE_PROVIDER_SITE_OTHER): Payer: 59

## 2012-12-26 DIAGNOSIS — E538 Deficiency of other specified B group vitamins: Secondary | ICD-10-CM

## 2012-12-27 DIAGNOSIS — E538 Deficiency of other specified B group vitamins: Secondary | ICD-10-CM

## 2012-12-27 MED ORDER — CYANOCOBALAMIN 1000 MCG/ML IJ SOLN
1000.0000 ug | Freq: Once | INTRAMUSCULAR | Status: AC
  Administered 2012-12-27: 1000 ug via INTRAMUSCULAR

## 2013-01-30 ENCOUNTER — Other Ambulatory Visit: Payer: Self-pay | Admitting: Pulmonary Disease

## 2013-02-01 ENCOUNTER — Other Ambulatory Visit: Payer: Self-pay | Admitting: Pulmonary Disease

## 2013-02-01 MED ORDER — ALPRAZOLAM 0.5 MG PO TABS: ORAL_TABLET | ORAL | Status: DC

## 2013-02-13 ENCOUNTER — Encounter (HOSPITAL_COMMUNITY): Payer: Self-pay | Admitting: Pharmacist

## 2013-02-13 NOTE — H&P (Signed)
Denise King  DICTATION # [id number] CSN# [id number]   Meriel Pica, MD 02/13/2013 1:14 PM

## 2013-02-13 NOTE — H&P (Signed)
Denise King, MARKS           ACCOUNT NO.:  [id number]  MEDICAL RECORD NO.:  [id number]  LOCATION:  [facility]IO                         FACILITY:  [facility]  PHYSICIAN:  Duke Salvia. Marcelle Overlie, M.D.DATE OF BIRTH:  [birth year]  DATE OF ADMISSION:  01/27/2013 DATE OF DISCHARGE:                             HISTORY & PHYSICAL   CHIEF COMPLAINT:  Acute and chronic left lower quadrant pain.  HISTORY OF PRESENT ILLNESS:  A 62 year old, G3, P3, prior tubal.  This patient for 6-12 months has had chronic left lower quadrant pain, generally midline, but to the left deep in the pelvis always in the same spot.  She is up-to-date on her colonoscopy, the most recent when they did remove a benign polyp, but no other abnormalities were noted. Because of the chronicity of the pain and the fact that it is getting worse over time, she is scheduled now for diagnostic laparoscopy to rule out GYN or other causes for her pain.  This procedure including specific risks related to bleeding, infection, or other complications that may require open additional surgery were reviewed with her along with her expected recovery time.  ALLERGIES:  SULFA and LIPITOR.  CURRENT MEDICATIONS:  Cymbalta, Zofran, Opana, atenolol, losartan, Percocet p.r.n., Protonix, Hyoscyamine p.r.n., and Xanax p.r.n.  SURGICAL HISTORY:  She has had back surgery in 2009, prior appendectomy, tonsillectomy, and 3 vaginal deliveries.  REVIEW OF SYSTEMS:  Significant for history of [name], mild anemia, headache, UTI, osteoporosis, diverticulosis, arthritis, and treated hypertension.  FAMILY HISTORY:  Significant for family history of colon cancer, headache, heart disease, [name], anemia, UTI, osteoporosis, diverticulosis, and arthritis.  SOCIAL HISTORY:  She does smoke 1 PPD, drinks 1 alcoholic drink per day. No history of drug use.  She is married.  Dr. Alroy Dust is her PCP.  PHYSICAL EXAMINATION:  VITAL SIGNS:  Temperature 98.9, blood  pressure 170/100. HEENT:  Unremarkable. NECK:  Supple without masses. LUNGS:  Clear. CARDIOVASCULAR:  Regular rate and rhythm without murmurs, rubs, or gallops. BREASTS:  Without masses or tenderness. ABDOMEN:  Soft, flat.  No rebound, evidence of hernia or other mass noted on exam. PELVIC:  Vulva, vagina, cervix normal.  Uterus mid position, normal size, nontender.  Slightly tender on the left, but no mass or nodularity noted. EXTREMITIES:  Unremarkable. NEUROLOGIC:  Unremarkable.  IMPRESSION:  Chronic left lower quadrant pain, certainly may be related to her irritable bowel syndrome, rule out gynecologic causes.  Procedure and risks discussed as above.     Allan Minotti M. Marcelle Overlie, M.D.     [facility]/MEDQ  D:  02/13/2013  T:  02/13/2013  Job:  [id number]

## 2013-02-14 ENCOUNTER — Encounter (HOSPITAL_COMMUNITY): Payer: Self-pay

## 2013-02-14 ENCOUNTER — Encounter (HOSPITAL_COMMUNITY)
Admission: RE | Admit: 2013-02-14 | Discharge: 2013-02-14 | Disposition: A | Discharge status: Home / Self Care | Payer: 59 | Source: Ambulatory Visit | Attending: Obstetrics and Gynecology | Admitting: Obstetrics and Gynecology

## 2013-02-14 HISTORY — DX: Personal history of other specified conditions: Z87.898

## 2013-02-14 LAB — SURGICAL PCR SCREEN
MRSA, PCR: NEGATIVE
Staphylococcus aureus: NEGATIVE

## 2013-02-14 LAB — BASIC METABOLIC PANEL
BUN: 6 mg/dL (ref 6–23)
CO2: 29 mEq/L (ref 19–32)
Calcium: 9 mg/dL (ref 8.4–10.5)
Chloride: 100 mEq/L (ref 96–112)
Creatinine, Ser: 0.51 mg/dL (ref 0.50–1.10)
GFR calc Af Amer: >90 mL/min (ref >90)
GFR calc non Af Amer: >90 mL/min (ref >90)
Glucose, Bld: 86 mg/dL (ref 70–99)
Potassium: 3.9 mEq/L (ref 3.5–5.1)
Sodium: 140 mEq/L (ref 135–145)

## 2013-02-14 LAB — CBC
HCT: 39.4 % (ref 36.0–46.0)
Hemoglobin: 13.6 g/dL (ref 12.0–15.0)
MCH: 34.3 pg — ABNORMAL HIGH (ref 26.0–34.0)
MCHC: 34.5 g/dL (ref 30.0–36.0)
MCV: 99.2 fL (ref 78.0–100.0)
Platelets: 319 10*3/uL (ref 150–400)
RBC: 3.97 MIL/uL (ref 3.87–5.11)
RDW: 11.8 % (ref 11.5–15.5)
WBC: 9.1 10*3/uL (ref 4.0–10.5)

## 2013-02-14 NOTE — Patient Instructions (Addendum)
Your procedure is scheduled on:02/21/13  Enter through the Main Entrance at :6am Pick up desk phone and dial [phone] and inform us of your arrival.  Please call [phone] if you have any problems the morning of surgery.  Remember: Do not eat or drink after midnight:Monday   Take these meds the morning of surgery with a sip of water:Losartan, Opana  DO NOT wear jewelry, eye make-up, lipstick,body lotion, or dark fingernail polish. Do not shave for 48 hours prior to surgery.  If you are to be admitted after surgery, leave suitcase in car until your room has been assigned. Patients discharged on the day of surgery will not be allowed to drive home.

## 2013-02-21 ENCOUNTER — Encounter (HOSPITAL_COMMUNITY): Payer: Self-pay | Admitting: *Deleted

## 2013-02-21 ENCOUNTER — Ambulatory Visit (HOSPITAL_COMMUNITY)
Admission: RE | Admit: 2013-02-21 | Discharge: 2013-02-21 | Disposition: A | Discharge status: Home / Self Care | Payer: 59 | Source: Ambulatory Visit | Attending: Obstetrics and Gynecology | Admitting: Obstetrics and Gynecology

## 2013-02-21 ENCOUNTER — Encounter (HOSPITAL_COMMUNITY): Payer: Self-pay | Admitting: Anesthesiology

## 2013-02-21 ENCOUNTER — Encounter (HOSPITAL_COMMUNITY)
Admission: RE | Disposition: A | Discharge status: Home / Self Care | Payer: Self-pay | Source: Ambulatory Visit | Attending: Obstetrics and Gynecology

## 2013-02-21 ENCOUNTER — Ambulatory Visit (HOSPITAL_COMMUNITY): Payer: 59 | Admitting: Anesthesiology

## 2013-02-21 DIAGNOSIS — R1032 Left lower quadrant pain: Secondary | ICD-10-CM | POA: Insufficient documentation

## 2013-02-21 DIAGNOSIS — K589 Irritable bowel syndrome without diarrhea: Secondary | ICD-10-CM | POA: Insufficient documentation

## 2013-02-21 HISTORY — PX: LAPAROSCOPY: SHX197

## 2013-02-21 SURGERY — LAPAROSCOPY OPERATIVE
Anesthesia: General | Site: Abdomen | Wound class: Clean

## 2013-02-21 MED ORDER — ONDANSETRON HCL 4 MG/2ML IJ SOLN
INTRAMUSCULAR | Status: DC | PRN
  Administered 2013-02-21 (×2): 4 mg via INTRAVENOUS

## 2013-02-21 MED ORDER — MEPERIDINE HCL 25 MG/ML IJ SOLN: 6.2500 mg | INTRAMUSCULAR | Status: DC | PRN

## 2013-02-21 MED ORDER — KETOROLAC TROMETHAMINE 30 MG/ML IJ SOLN: 15.0000 mg | Freq: Once | INTRAMUSCULAR | Status: DC | PRN

## 2013-02-21 MED ORDER — SODIUM CHLORIDE 0.9 % IJ SOLN
INTRAMUSCULAR | Status: DC | PRN
  Administered 2013-02-21: 10 mL

## 2013-02-21 MED ORDER — NEOSTIGMINE METHYLSULFATE 1 MG/ML IJ SOLN
INTRAMUSCULAR | Status: AC
  Filled 2013-02-21: qty 1

## 2013-02-21 MED ORDER — PHENYLEPHRINE HCL 10 MG/ML IJ SOLN
INTRAMUSCULAR | Status: DC | PRN
  Administered 2013-02-21: 80 ug via INTRAVENOUS

## 2013-02-21 MED ORDER — LACTATED RINGERS IV SOLN
INTRAVENOUS | Status: DC
  Administered 2013-02-21: 125 mL/h via INTRAVENOUS
  Administered 2013-02-21 (×2): via INTRAVENOUS

## 2013-02-21 MED ORDER — FENTANYL CITRATE 0.05 MG/ML IJ SOLN: 25.0000 ug | INTRAMUSCULAR | Status: DC | PRN

## 2013-02-21 MED ORDER — PHENYLEPHRINE 40 MCG/ML (10ML) SYRINGE FOR IV PUSH (FOR BLOOD PRESSURE SUPPORT)
PREFILLED_SYRINGE | INTRAVENOUS | Status: AC
  Filled 2013-02-21: qty 5

## 2013-02-21 MED ORDER — LIDOCAINE HCL (CARDIAC) 20 MG/ML IV SOLN
INTRAVENOUS | Status: DC | PRN
  Administered 2013-02-21: 50 mg via INTRAVENOUS

## 2013-02-21 MED ORDER — ONDANSETRON HCL 4 MG/2ML IJ SOLN
INTRAMUSCULAR | Status: AC
  Filled 2013-02-21: qty 2

## 2013-02-21 MED ORDER — ACETAMINOPHEN 10 MG/ML IV SOLN
INTRAVENOUS | Status: AC
  Administered 2013-02-21: 1000 mg via INTRAVENOUS
  Filled 2013-02-21: qty 100

## 2013-02-21 MED ORDER — PROMETHAZINE HCL 25 MG/ML IJ SOLN: 6.2500 mg | INTRAMUSCULAR | Status: DC | PRN

## 2013-02-21 MED ORDER — PROPOFOL 10 MG/ML IV EMUL
INTRAVENOUS | Status: DC | PRN
  Administered 2013-02-21: 160 mg via INTRAVENOUS
  Administered 2013-02-21: 40 mg via INTRAVENOUS

## 2013-02-21 MED ORDER — GLYCOPYRROLATE 0.2 MG/ML IJ SOLN
INTRAMUSCULAR | Status: DC | PRN
  Administered 2013-02-21: 0.4 mg via INTRAVENOUS

## 2013-02-21 MED ORDER — LIDOCAINE HCL (CARDIAC) 20 MG/ML IV SOLN
INTRAVENOUS | Status: AC
  Filled 2013-02-21: qty 5

## 2013-02-21 MED ORDER — FENTANYL CITRATE 0.05 MG/ML IJ SOLN
INTRAMUSCULAR | Status: DC | PRN
  Administered 2013-02-21 (×3): 50 ug via INTRAVENOUS
  Administered 2013-02-21: 100 ug via INTRAVENOUS

## 2013-02-21 MED ORDER — GLYCOPYRROLATE 0.2 MG/ML IJ SOLN
INTRAMUSCULAR | Status: AC
  Filled 2013-02-21: qty 3

## 2013-02-21 MED ORDER — FENTANYL CITRATE 0.05 MG/ML IJ SOLN
INTRAMUSCULAR | Status: AC
  Filled 2013-02-21: qty 5

## 2013-02-21 MED ORDER — ACETAMINOPHEN 10 MG/ML IV SOLN: 1000.0000 mg | Freq: Once | INTRAVENOUS | Status: DC

## 2013-02-21 MED ORDER — ROCURONIUM BROMIDE 100 MG/10ML IV SOLN
INTRAVENOUS | Status: DC | PRN
  Administered 2013-02-21: 10 mg via INTRAVENOUS
  Administered 2013-02-21: 20 mg via INTRAVENOUS

## 2013-02-21 MED ORDER — PROPOFOL 10 MG/ML IV EMUL
INTRAVENOUS | Status: AC
  Filled 2013-02-21: qty 20

## 2013-02-21 MED ORDER — NEOSTIGMINE METHYLSULFATE 1 MG/ML IJ SOLN
INTRAMUSCULAR | Status: DC | PRN
  Administered 2013-02-21: 2 mg via INTRAVENOUS

## 2013-02-21 MED ORDER — MIDAZOLAM HCL 2 MG/2ML IJ SOLN
INTRAMUSCULAR | Status: AC
  Filled 2013-02-21: qty 2

## 2013-02-21 MED ORDER — KETOROLAC TROMETHAMINE 30 MG/ML IJ SOLN
INTRAMUSCULAR | Status: DC | PRN
  Administered 2013-02-21: 30 mg via INTRAVENOUS

## 2013-02-21 MED ORDER — MIDAZOLAM HCL 5 MG/5ML IJ SOLN
INTRAMUSCULAR | Status: DC | PRN
  Administered 2013-02-21: 2 mg via INTRAVENOUS

## 2013-02-21 MED ORDER — HYDROMORPHONE HCL PF 1 MG/ML IJ SOLN
INTRAMUSCULAR | Status: AC
  Filled 2013-02-21: qty 1

## 2013-02-21 MED ORDER — BUPIVACAINE HCL (PF) 0.25 % IJ SOLN
INTRAMUSCULAR | Status: DC | PRN
  Administered 2013-02-21: 9 mL

## 2013-02-21 MED ORDER — BUPIVACAINE HCL (PF) 0.25 % IJ SOLN
INTRAMUSCULAR | Status: AC
  Filled 2013-02-21: qty 30

## 2013-02-21 MED ORDER — ROCURONIUM BROMIDE 50 MG/5ML IV SOLN
INTRAVENOUS | Status: AC
  Filled 2013-02-21: qty 1

## 2013-02-21 MED ORDER — KETOROLAC TROMETHAMINE 30 MG/ML IJ SOLN
INTRAMUSCULAR | Status: AC
  Filled 2013-02-21: qty 1

## 2013-02-21 MED ORDER — MIDAZOLAM HCL 2 MG/2ML IJ SOLN: 0.5000 mg | Freq: Once | INTRAMUSCULAR | Status: DC | PRN

## 2013-02-21 SURGICAL SUPPLY — 29 items
ADH SKN CLS APL DERMABOND .7 (GAUZE/BANDAGES/DRESSINGS) ×1
ADH SKN CLS LQ APL DERMABOND (GAUZE/BANDAGES/DRESSINGS) ×1
BAG SPEC RTRVL LRG 6X4 10 (ENDOMECHANICALS)
CABLE HIGH FREQUENCY MONO STRZ (ELECTRODE) IMPLANT
CATH ROBINSON RED A/P 16FR (CATHETERS) ×2 IMPLANT
CLOTH BEACON ORANGE TIMEOUT ST (SAFETY) ×2 IMPLANT
DERMABOND ADHESIVE PROPEN (GAUZE/BANDAGES/DRESSINGS) ×1
DERMABOND ADVANCED (GAUZE/BANDAGES/DRESSINGS) ×1
DERMABOND ADVANCED .7 DNX12 (GAUZE/BANDAGES/DRESSINGS) ×1 IMPLANT
DERMABOND ADVANCED .7 DNX6 (GAUZE/BANDAGES/DRESSINGS) IMPLANT
GLOVE BIO SURGEON STRL SZ7 (GLOVE) ×2 IMPLANT
GOWN PREVENTION PLUS LG XLONG (DISPOSABLE) ×4 IMPLANT
NEEDLE INSUFFLATION 120MM (ENDOMECHANICALS) ×2 IMPLANT
NS IRRIG 1000ML POUR BTL (IV SOLUTION) ×2 IMPLANT
PACK LAPAROSCOPY BASIN (CUSTOM PROCEDURE TRAY) ×2 IMPLANT
POUCH SPECIMEN RETRIEVAL 10MM (ENDOMECHANICALS) IMPLANT
PROTECTOR NERVE ULNAR (MISCELLANEOUS) ×2 IMPLANT
SEALER TISSUE G2 CVD JAW 35 (ENDOMECHANICALS) IMPLANT
SEALER TISSUE G2 CVD JAW 45CM (ENDOMECHANICALS) IMPLANT
SET IRRIG TUBING LAPAROSCOPIC (IRRIGATION / IRRIGATOR) IMPLANT
SUT MON AB 2-0 CT1 36 (SUTURE) ×6 IMPLANT
SUT VIC AB 2-0 CT1 18 (SUTURE) ×4 IMPLANT
SUT VICRYL 0 TIES 12 18 (SUTURE) ×2 IMPLANT
SUT VICRYL RAPIDE 4/0 PS 2 (SUTURE) ×4 IMPLANT
TOWEL OR 17X24 6PK STRL BLUE (TOWEL DISPOSABLE) ×4 IMPLANT
TROCAR OPTI TIP 5M 100M (ENDOMECHANICALS) ×4 IMPLANT
TROCAR XCEL DIL TIP R 11M (ENDOMECHANICALS) ×2 IMPLANT
WARMER LAPAROSCOPE (MISCELLANEOUS) ×2 IMPLANT
WATER STERILE IRR 1000ML POUR (IV SOLUTION) ×2 IMPLANT

## 2013-02-21 NOTE — Anesthesia Postprocedure Evaluation (Signed)
  Anesthesia Post Note  Patient: Denise King  Procedure(s) Performed: Procedure(s) (LRB): LAPAROSCOPY OPERATIVE (N/A)  Anesthesia type: GA  Patient location: PACU  Post pain: Pain level controlled  Post assessment: Post-op Vital signs reviewed  Last Vitals:  Filed Vitals:   02/21/13 1015  BP: 94/59  Pulse: 82  Temp:   Resp: 21    Post vital signs: Reviewed  Level of consciousness: sedated  Complications: No apparent anesthesia complications

## 2013-02-21 NOTE — Transfer of Care (Signed)
Immediate Anesthesia Transfer of Care Note  Patient: Denise King  Procedure(s) Performed: Procedure(s) with comments: LAPAROSCOPY OPERATIVE (N/A) - REQUESTING SCOPE WITH CAMERA  Patient Location: PACU  Anesthesia Type:General  Level of Consciousness: sedated  Airway & Oxygen Therapy: Patient Spontanous Breathing and Patient connected to nasal cannula oxygen  Post-op Assessment: Report given to PACU RN  Post vital signs: Reviewed and stable  Complications: No apparent anesthesia complications

## 2013-02-21 NOTE — Anesthesia Preprocedure Evaluation (Signed)
Anesthesia Evaluation  Patient identified by MRN, date of birth, ID band Patient awake    Reviewed: Allergy & Precautions, H&P , Patient's Chart, lab work & pertinent test results, reviewed documented beta blocker date and time   History of Anesthesia Complications Negative for: history of anesthetic complications  Airway Mallampati: II TM Distance: >3 FB Neck ROM: full    Dental no notable dental hx.    Pulmonary neg pulmonary ROS,  breath sounds clear to auscultation  Pulmonary exam normal       Cardiovascular Exercise Tolerance: Good hypertension, + Peripheral Vascular Disease negative cardio ROS  Rhythm:regular Rate:Normal     Neuro/Psych  Headaches, PSYCHIATRIC DISORDERS Anxiety Depression negative neurological ROS  negative psych ROS   GI/Hepatic negative GI ROS, Neg liver ROS, GERD-  Controlled,  Endo/Other  negative endocrine ROS  Renal/GU Renal diseasenegative Renal ROS     Musculoskeletal   Abdominal   Peds  Hematology negative hematology ROS (+) anemia ,   Anesthesia Other Findings  Unspecified chronic bronchitis     Tobacco use disorder        Reflux esophagitis     Diverticulosis of colon        Irritable bowel syndrome     Hx of colonic polyps        Acute cystitis     Back pain        Chronic pain syndrome     Osteopenia        Vertigo     Dysthymia        Depression     Vitamin B12 deficiency        Allergy   as a child grew out of them Anxiety        Arthritis     Blood transfusion        Hypertension     Osteoporosis        Gastritis     GERD (gastroesophageal reflux disease)        Anemia   pernicious anemia H/O chest pain Dec. 2013 no work up done    Reproductive/Obstetrics negative OB ROS                           Anesthesia Physical Anesthesia Plan  ASA: III  Anesthesia Plan: General ETT   Post-op Pain Management:    Induction:   Airway Management  Planned:   Additional Equipment:   Intra-op Plan:   Post-operative Plan:   Informed Consent: I have reviewed the patients History and Physical, chart, labs and discussed the procedure including the risks, benefits and alternatives for the proposed anesthesia with the patient or authorized representative who has indicated his/her understanding and acceptance.   Dental Advisory Given  Plan Discussed with: CRNA and Surgeon  Anesthesia Plan Comments:         Anesthesia Quick Evaluation

## 2013-02-21 NOTE — Op Note (Signed)
Preoperative diagnosis: Chronic left lower quadrant pelvic pain  Postoperative diagnosis: Same  Procedure: Diagnostic laparoscopy  Surgeon: Marcelle Overlie  EBL: Less than 10 cc  Complications: None  Anesthesia: Gen.  Procedure and findings:  Patient taken the operating room after an adequate level of general anesthesia was obtained with the patient's legs position prior to anesthesia for maximum comfort, an adequate level of general anesthesia was obtained. Appropriate timeout for taken at that point the abdomen perineum and vagina prepped and draped in usual fashion for laparoscopy EUA was carried out uterus was small mobile no masses or unusual nodularity noted. After the bladder was drained Hulka tenaculum was positioned. Attention directed to the abdomen where the subumbilical area was infiltrated with quarter percent Marcaine plain small incision was made in the varies needle was introduced without difficulty. After 2-1/2 L pneumoperitoneum syncopated lap scopic trocar and sleeve were then introduced, 5 mm trocar. There was no evidence of any bleeding or trauma. 3 finger breaths above the symphysis in the midline and another 5 mm trocar was inserted to place a manipulator. With the patient in Trendelenburg the findings were as follows:  The upper abdomen was unremarkable except for some omental adhesions into the area for prior appendectomy. Uterus itself was small the serosa appeared to be normal anterior posterior cul-de-sac were free and clear right tube and every were small otherwise normal the course of the ureter on the right pelvic sidewall along the peritoneum on that side was unremarkable. Was noted that there was some apparent slight dilatation of the course of the left ureter no areas of endometriosis adhesions or other abnormalities noted on that side to explain her pain. After these findings for further documented in terms removed gas allowed to escape the lower incision closed Dermabond  4-0 Vicryl repeat subcuticular suture for the umbilical incision. She tolerated this well went to recovery room in good condition.  Dictated with [facility]  Froilan Mclean M. Milana Obey.D.

## 2013-02-21 NOTE — Progress Notes (Signed)
The patient was re-examined with no change in status 

## 2013-02-22 ENCOUNTER — Encounter (HOSPITAL_COMMUNITY): Payer: Self-pay | Admitting: Obstetrics and Gynecology

## 2013-02-23 ENCOUNTER — Other Ambulatory Visit (HOSPITAL_COMMUNITY): Payer: Self-pay | Admitting: Obstetrics and Gynecology

## 2013-02-23 DIAGNOSIS — R102 Pelvic and perineal pain: Secondary | ICD-10-CM

## 2013-02-24 ENCOUNTER — Inpatient Hospital Stay (HOSPITAL_COMMUNITY): Payer: 59

## 2013-02-24 ENCOUNTER — Inpatient Hospital Stay (HOSPITAL_COMMUNITY)
Admission: AD | Admit: 2013-02-24 | Discharge: 2013-02-24 | Disposition: A | Discharge status: Home / Self Care | Payer: 59 | Source: Ambulatory Visit | Attending: Obstetrics and Gynecology | Admitting: Obstetrics and Gynecology

## 2013-02-24 ENCOUNTER — Encounter (HOSPITAL_COMMUNITY): Payer: Self-pay

## 2013-02-24 DIAGNOSIS — G8918 Other acute postprocedural pain: Secondary | ICD-10-CM

## 2013-02-24 DIAGNOSIS — K5900 Constipation, unspecified: Secondary | ICD-10-CM

## 2013-02-24 DIAGNOSIS — R1032 Left lower quadrant pain: Secondary | ICD-10-CM | POA: Insufficient documentation

## 2013-02-24 LAB — URINALYSIS, ROUTINE W REFLEX MICROSCOPIC
Bilirubin Urine: NEGATIVE
Glucose, UA: NEGATIVE mg/dL
Hgb urine dipstick: NEGATIVE
Ketones, ur: NEGATIVE mg/dL
Leukocytes, UA: NEGATIVE
Nitrite: NEGATIVE
Protein, ur: NEGATIVE mg/dL
Specific Gravity, Urine: 1.015 (ref 1.005–1.030)
Urobilinogen, UA: 0.2 mg/dL (ref 0.0–1.0)
pH: 7.5 (ref 5.0–8.0)

## 2013-02-24 LAB — CBC WITH DIFFERENTIAL/PLATELET
Basophils Absolute: 0 10*3/uL (ref 0.0–0.1)
Basophils Relative: 0 % (ref 0–1)
Eosinophils Absolute: 0.1 10*3/uL (ref 0.0–0.7)
Eosinophils Relative: 1 % (ref 0–5)
HCT: 36.4 % (ref 36.0–46.0)
Hemoglobin: 12.2 g/dL (ref 12.0–15.0)
Lymphocytes Relative: 44 % (ref 12–46)
Lymphs Abs: 3.9 10*3/uL (ref 0.7–4.0)
MCH: 33.2 pg (ref 26.0–34.0)
MCHC: 33.5 g/dL (ref 30.0–36.0)
MCV: 99.2 fL (ref 78.0–100.0)
Monocytes Absolute: 0.8 10*3/uL (ref 0.1–1.0)
Monocytes Relative: 8 % (ref 3–12)
Neutro Abs: 4.2 10*3/uL (ref 1.7–7.7)
Neutrophils Relative %: 47 % (ref 43–77)
Platelets: 249 10*3/uL (ref 150–400)
RBC: 3.67 MIL/uL — ABNORMAL LOW (ref 3.87–5.11)
RDW: 12.3 % (ref 11.5–15.5)
WBC: 8.9 10*3/uL (ref 4.0–10.5)

## 2013-02-24 MED ORDER — SODIUM CHLORIDE 0.9 % IV SOLN
INTRAVENOUS | Status: DC
  Administered 2013-02-24: 18:00:00 via INTRAVENOUS

## 2013-02-24 MED ORDER — ONDANSETRON 8 MG PO TBDP
8.0000 mg | ORAL_TABLET | Freq: Once | ORAL | Status: AC
  Administered 2013-02-24: 8 mg via ORAL
  Filled 2013-02-24: qty 1

## 2013-02-24 MED ORDER — IOHEXOL 300 MG/ML  SOLN
100.0000 mL | Freq: Once | INTRAMUSCULAR | Status: AC | PRN
  Administered 2013-02-24: 100 mL via INTRAVENOUS

## 2013-02-24 MED ORDER — KETOROLAC TROMETHAMINE 30 MG/ML IJ SOLN
30.0000 mg | Freq: Once | INTRAMUSCULAR | Status: AC
  Administered 2013-02-24: 30 mg via INTRAVENOUS
  Filled 2013-02-24: qty 1

## 2013-02-24 MED ORDER — IOHEXOL 300 MG/ML  SOLN
50.0000 mL | INTRAMUSCULAR | Status: AC
  Administered 2013-02-24: 50 mL via ORAL

## 2013-02-24 NOTE — MAU Note (Signed)
Diagnostic lap on Tues.  Lower abd seems to be getting more swollen, extremely tender to touch.  Bruising noted around umbilicus.  Sharp pain with last urination.  Has not had a bm since Tues, though this is not unusual for her.

## 2013-02-24 NOTE — MAU Provider Note (Signed)
History     CSN: [id number]  Arrival date and time: 02/24/13 1511   First Provider Initiated Contact with Patient 02/24/13 1625      Chief Complaint  Patient presents with  . Post-op Problem   HPI [name] is a 62 y.o. female who presents to MAU with complaint of lower abdominal pain 3 days post-op from exploratory laparoscopy. The patient called the office because she was still "swollen" and sore in her lower abdomen and wanted to ensure this was normal. They requested she come in for evaluation. She rates her pain at 6/10 and worse with ambulation. She has not had a BM since the surgery. She denies UTI symptoms and fever. Her pain is more prominent in the LLQ and suprapubic regions. She took 3 percocet yesterday and Opana this morning for pain. She receives chronic pain management for back pain. She was told that there was something abnormal seen during surgery, but unsure what and scheduled for a CT scan on 03/03/13.   OB History   Grav Para Term Preterm Abortions TAB SAB Ect Mult Living                  Past Medical History  Diagnosis Date  . Unspecified chronic bronchitis   . Tobacco use disorder   . Reflux esophagitis   . Diverticulosis of colon   . Irritable bowel syndrome   . Hx of colonic polyps   . Acute cystitis   . Back pain   . Chronic pain syndrome   . Osteopenia   . Vertigo   . Dysthymia   . Depression   . Vitamin B12 deficiency   . Allergy     as a child grew out of them  . Anxiety   . Arthritis   . Blood transfusion   . Hypertension   . Osteoporosis   . Gastritis   . GERD (gastroesophageal reflux disease)   . Anemia     pernicious anemia  . H/O chest pain Dec. 2013    no work up done    Past Surgical History  Procedure Laterality Date  . Appendectomy    . Tubal ligation    . Back surgery  10-09    Dr. Noel Gerold  . Tonsillectomy    . Laparoscopy N/A 02/21/2013    Procedure: LAPAROSCOPY OPERATIVE;  Surgeon: Meriel Pica, MD;   Location: [facility];  Service: Gynecology;  Laterality: N/A;  REQUESTING SCOPE WITH CAMERA    Family History  Problem Relation Age of Onset  . Colon cancer Father   . Prostate cancer Father   . Heart disease Mother     prev MVR, also has DJD  . Colon cancer Brother   . Skin cancer Sister   . Esophageal cancer Neg Hx   . Rectal cancer Neg Hx   . Stomach cancer Neg Hx     History  Substance Use Topics  . Smoking status: Current Every Day Smoker -- 1.00 packs/day for 30 years    Types: Cigarettes  . Smokeless tobacco: Never Used  . Alcohol Use: Yes     Comment: occasional    Allergies:  Allergies  Allergen Reactions  . Atorvastatin Nausea And Vomiting  . Levofloxacin     Reaction: achilles tendon pain  . Omnicef (Cefdinir) Diarrhea    Uncontrollable diarrhea  . Trazodone And Nefazodone     "Felt like I was going to faint"  . Sulfonamide Derivatives Rash  Prescriptions prior to admission  Medication Sig Dispense Refill  . alosetron (LOTRONEX) 1 MG tablet Take 1 mg by mouth daily as needed (for diarrhea).       . ALPRAZolam (XANAX) 0.5 MG tablet Take 0.25-0.5 mg by mouth at bedtime as needed for sleep or anxiety.      Marland Kitchen atenolol (TENORMIN) 25 MG tablet Take 12.5-25 mg by mouth daily.      . chlorzoxazone (PARAFON) 500 MG tablet Take 1 tablet by mouth Three times daily as needed for muscle spasms.       . cyanocobalamin (,VITAMIN B-12,) 1000 MCG/ML injection Inject 1,000 mcg into the muscle every 30 (thirty) days.      . Doxylamine Succinate, Sleep, (UNISOM) 25 MG tablet Take 25 mg by mouth at bedtime.       . DULoxetine (CYMBALTA) 60 MG capsule Take 60 mg by mouth daily.      . hyoscyamine (LEVSIN SL) 0.125 MG SL tablet Place 1 tablet under the tongue Every 4 hours as needed for cramping.       Marland Kitchen losartan (COZAAR) 50 MG tablet Take 1 tablet (50 mg total) by mouth daily.  30 tablet  11  . Multiple Vitamins-Minerals (MULTIVITAMIN WITH MINERALS) tablet Take 1 tablet by  mouth daily.        . ondansetron (ZOFRAN) 4 MG tablet Take 4 mg by mouth every 4 (four) hours as needed for nausea.      Marland Kitchen oxyCODONE-acetaminophen (PERCOCET) 7.5-325 MG per tablet Take 1 tablet by mouth every 8 (eight) hours as needed for pain.      Marland Kitchen oxymorphone (OPANA ER) 30 MG 12 hr tablet Take 30 mg by mouth every 12 (twelve) hours.      . zoledronic acid (RECLAST) 5 MG/100ML SOLN Inject 5 mg into the vein once. Patient is due for one when she feels better        Review of Systems  Constitutional: Negative for fever, chills and malaise/fatigue.  Gastrointestinal: Positive for abdominal pain and constipation. Negative for nausea, vomiting and diarrhea.  Genitourinary: Negative for dysuria, urgency and frequency.       Neg- vaginal bleeding Neg - vaginal discharge  Musculoskeletal: Positive for back pain.   Physical Exam   Blood pressure 164/104, pulse 96, temperature 98.1 F (36.7 C), temperature source Oral, resp. rate 20, SpO2 100.00%.  Physical Exam  Constitutional: She appears well-developed and well-nourished. No distress.  HENT:  Head: Normocephalic and atraumatic.  Cardiovascular: Normal rate, regular rhythm and normal heart sounds.   Respiratory: Effort normal and breath sounds normal. No respiratory distress.  GI: Soft. She exhibits no distension and no mass. Bowel sounds are increased. There is tenderness in the suprapubic area and left lower quadrant. There is no rebound and no guarding.  Large hematoma noted just below the umbilicus Two 3cm healing surgical wounds without edema, heat or surrounding erythema at the midline below the umbilicus.    Results for orders placed during the hospital encounter of 02/24/13 (from the past 24 hour(s))  URINALYSIS, ROUTINE W REFLEX MICROSCOPIC     Status: None   Collection Time    02/24/13  3:40 PM      Result Value Range   Color, Urine YELLOW  YELLOW   APPearance CLEAR  CLEAR   Specific Gravity, Urine 1.015  1.005 - 1.030    pH 7.5  5.0 - 8.0   Glucose, UA NEGATIVE  NEGATIVE mg/dL   Hgb urine dipstick NEGATIVE  NEGATIVE  Bilirubin Urine NEGATIVE  NEGATIVE   Ketones, ur NEGATIVE  NEGATIVE mg/dL   Protein, ur NEGATIVE  NEGATIVE mg/dL   Urobilinogen, UA 0.2  0.0 - 1.0 mg/dL   Nitrite NEGATIVE  NEGATIVE   Leukocytes, UA NEGATIVE  NEGATIVE  CBC WITH DIFFERENTIAL     Status: Abnormal   Collection Time    02/24/13  4:18 PM      Result Value Range   WBC 8.9  4.0 - 10.5 K/uL   RBC 3.67 (*) 3.87 - 5.11 MIL/uL   Hemoglobin 12.2  12.0 - 15.0 g/dL   HCT 14.7  82.9 - 56.2 %   MCV 99.2  78.0 - 100.0 fL   MCH 33.2  26.0 - 34.0 pg   MCHC 33.5  30.0 - 36.0 g/dL   RDW 13.0  86.5 - 78.4 %   Platelets 249  150 - 400 K/uL   Neutrophils Relative 47  43 - 77 %   Neutro Abs 4.2  1.7 - 7.7 K/uL   Lymphocytes Relative 44  12 - 46 %   Lymphs Abs 3.9  0.7 - 4.0 K/uL   Monocytes Relative 8  3 - 12 %   Monocytes Absolute 0.8  0.1 - 1.0 K/uL   Eosinophils Relative 1  0 - 5 %   Eosinophils Absolute 0.1  0.0 - 0.7 K/uL   Basophils Relative 0  0 - 1 %   Basophils Absolute 0.0  0.0 - 0.1 K/uL   *RADIOLOGY REPORT*  Clinical Data: Status status post diagnostic laparoscopy on  Tuesday, postop pain, dilatation of the left ureter on laparoscopic  exam, prior appendectomy  CT ABDOMEN AND PELVIS WITH CONTRAST  Technique: Multidetector CT imaging of the abdomen and pelvis was  performed following the standard protocol during bolus  administration of intravenous contrast.  Contrast: OMNIPAQUE IOHEXOL 300 MG/ML SOLN  Comparison: 03/31/2012  Findings: Mild scarring versus atelectasis at the right lung base.  Small hiatal hernia.  Increased subcapsular perfusion along the right hepatic lobe  (series 2/image 16), which normalizes on delayed imaging, favored  to reflect a perfusion anomaly. Stable 6 mm cyst in the left  hepatic lobe (series 4/image 1).  Spleen, pancreas, and adrenal glands are within normal limits.  Gallbladder  is unremarkable. Dilated common duct, measuring up to  9 mm but tapering at the ampulla (coronal image 49), unchanged.  Scattered small bilateral renal cysts measuring up to 9 mm in the  right upper pole (series 4/image 7) and 8 mm in the left upper pole  (series 4/image 8).  No evidence of bowel obstruction. Prior appendectomy. Moderate  stool in the right/transverse colon.  Atherosclerotic calcifications of the abdominal aorta and branch  vessels.  No abdominopelvic ascites.  No suspicious abdominopelvic lymphadenopathy.  Uterus and bilateral ovaries are unremarkable.  No ureteral or bladder calculi. Left ureter is nondilated.  Bladder is within normal limits.  Postsurgical changes in the anterior abdominal wall (series 2/image  40 one).  Status post PLIF from L4-S1.  IMPRESSION:  No evidence of bowel obstruction.  Left ureter is nondilated.  Moderate stool in the right/transverse colon, suggesting  constipation.  Original Report Authenticated By: Charline Bills, M.D.  MAU Course  Procedures  MDM Discussed patient with Dr. Marcelle Overlie. He would like to get CT abdomen/pelvis with contrast today to determine if there is a source for pain and also look at left ureter dilatation that was seen on laparoscopic exam on 02/21/13. The patient was scheduled  for CT on 03/03/13. IV NS started for contrast. IV Toradol 30 mg given and ODT Zofran given Discussed [place] results with Dr. Marcelle Overlie. He recommends patient be discharged home. She may have enema here or take Mirilax at home. Patient opts for home management of constipation  Assessment and Plan  A: 3 day S/P exploratory laparoscopic surgery Constipation  P: Discharge home Patient encouraged to use Miralax to relieve constipation as directed at home Patient encouraged to keep using pain medications as directed Patient will follow-up with Dr. Marcelle Overlie as scheduled Patient may return to MAU as needed or if her condition were to change or  worsen  Freddi Starr, PA-C  02/24/2013, 9:20 PM

## 2013-02-28 ENCOUNTER — Ambulatory Visit (HOSPITAL_COMMUNITY): Payer: 59

## 2013-03-03 ENCOUNTER — Ambulatory Visit (HOSPITAL_COMMUNITY): Admission: RE | Admit: 2013-03-03 | Payer: 59 | Source: Ambulatory Visit

## 2013-03-07 ENCOUNTER — Ambulatory Visit (INDEPENDENT_AMBULATORY_CARE_PROVIDER_SITE_OTHER): Payer: 59

## 2013-03-07 DIAGNOSIS — D649 Anemia, unspecified: Secondary | ICD-10-CM

## 2013-03-08 MED ORDER — CYANOCOBALAMIN 1000 MCG/ML IJ SOLN
1000.0000 ug | Freq: Once | INTRAMUSCULAR | Status: AC
  Administered 2013-03-08: 1000 ug via INTRAMUSCULAR

## 2013-03-30 ENCOUNTER — Other Ambulatory Visit (HOSPITAL_COMMUNITY): Payer: Self-pay | Admitting: *Deleted

## 2013-03-31 ENCOUNTER — Encounter (HOSPITAL_COMMUNITY)
Admission: RE | Admit: 2013-03-31 | Discharge: 2013-03-31 | Disposition: A | Discharge status: Home / Self Care | Payer: 59 | Source: Ambulatory Visit | Attending: Obstetrics and Gynecology | Admitting: Obstetrics and Gynecology

## 2013-03-31 DIAGNOSIS — M810 Age-related osteoporosis without current pathological fracture: Secondary | ICD-10-CM | POA: Insufficient documentation

## 2013-03-31 MED ORDER — ZOLEDRONIC ACID 5 MG/100ML IV SOLN
INTRAVENOUS | Status: AC
  Filled 2013-03-31: qty 100

## 2013-03-31 MED ORDER — ZOLEDRONIC ACID 5 MG/100ML IV SOLN
5.0000 mg | Freq: Once | INTRAVENOUS | Status: AC
Start: 2013-03-31 — End: 2013-03-31
  Administered 2013-03-31: 5 mg via INTRAVENOUS

## 2013-04-07 ENCOUNTER — Ambulatory Visit (INDEPENDENT_AMBULATORY_CARE_PROVIDER_SITE_OTHER): Payer: 59

## 2013-04-07 DIAGNOSIS — D649 Anemia, unspecified: Secondary | ICD-10-CM

## 2013-04-10 MED ORDER — CYANOCOBALAMIN 1000 MCG/ML IJ SOLN
1000.0000 ug | Freq: Once | INTRAMUSCULAR | Status: AC
  Administered 2013-04-10: 1000 ug via INTRAMUSCULAR

## 2013-04-27 ENCOUNTER — Telehealth: Payer: Self-pay | Admitting: Pulmonary Disease

## 2013-04-27 NOTE — Telephone Encounter (Signed)
Per SN---  Ok to refill x2.  Pt will need ov with SN for further refills. thanks

## 2013-04-27 NOTE — Telephone Encounter (Signed)
Fax request for patient Xanax 0.5mg -- Take 1/2 to 1 tablet three times daily as needed  Last Filled 02/01/13 #90 1 refill  Last OV: 10/27/12 w return prn  Dr. Kriste Basque Please advise if this is ok to fill, thank you!

## 2013-04-28 NOTE — Telephone Encounter (Signed)
rx for the alprazolam has been called to the pharmacy x 2 and pt will need ov for further refills.  Nothing further is needed.

## 2013-05-15 ENCOUNTER — Ambulatory Visit (INDEPENDENT_AMBULATORY_CARE_PROVIDER_SITE_OTHER): Payer: 59

## 2013-05-15 DIAGNOSIS — J309 Allergic rhinitis, unspecified: Secondary | ICD-10-CM

## 2013-05-15 DIAGNOSIS — D649 Anemia, unspecified: Secondary | ICD-10-CM

## 2013-05-17 MED ORDER — CYANOCOBALAMIN 1000 MCG/ML IJ SOLN
1000.0000 ug | Freq: Once | INTRAMUSCULAR | Status: AC
  Administered 2013-05-17: 1000 ug via INTRAMUSCULAR

## 2013-06-15 ENCOUNTER — Ambulatory Visit: Payer: 59

## 2013-07-04 ENCOUNTER — Ambulatory Visit (INDEPENDENT_AMBULATORY_CARE_PROVIDER_SITE_OTHER): Payer: 59

## 2013-07-04 DIAGNOSIS — D649 Anemia, unspecified: Secondary | ICD-10-CM

## 2013-07-05 MED ORDER — CYANOCOBALAMIN 1000 MCG/ML IJ SOLN
1000.0000 ug | Freq: Once | INTRAMUSCULAR | Status: AC
  Administered 2013-07-05: 1000 ug via INTRAMUSCULAR

## 2013-07-30 ENCOUNTER — Other Ambulatory Visit: Payer: Self-pay | Admitting: Pulmonary Disease

## 2013-07-30 IMAGING — CT CT ENTEROGRAPHY (ABD-PELV W/ CM)
2 of 6 series · 17 of 46 positions shown, 19 images · IV contrast (omnipaque)
Comparison: 02/19/2010

CLINICAL DATA: Abdominal pain for 2 weeks.  Weight loss.
Diarrhea.  Nausea vomiting.  History of irritable bowel syndrome.

CT ABDOMEN AND PELVIS WITH CONTRAST (CT ENTEROGRAPHY)
TECHNIQUE: Multidetector CT of the abdomen and pelvis during bolus
administration of intravenous contrast. Negative oral contrast
VoLumen was given.
Contrast: 100mL OMNIPAQUE IOHEXOL 300 MG/ML IV SOLN 900 ml Volumen
enteric.

[Series 4: enterography standard · axial · 0.61mm/px · z∈[-434,-4]mm · 14 of 241 slices shown, 16 images]
[im 13/241  soft-tissue]
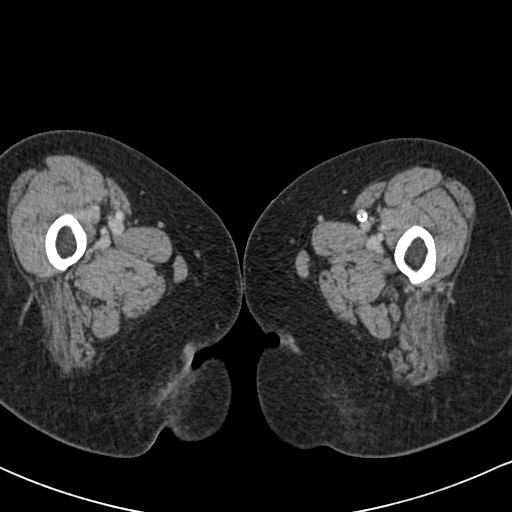
[im 13/241  bone]
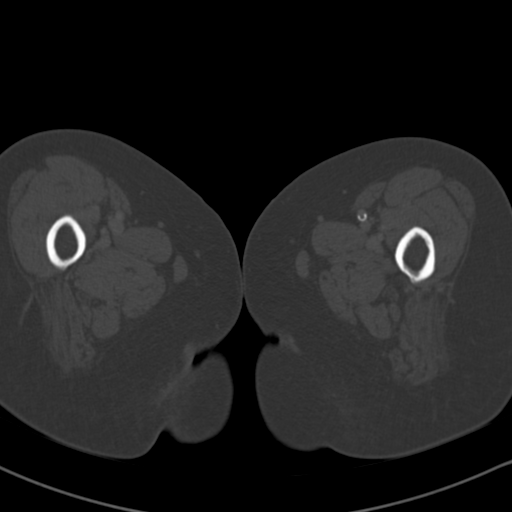
[im 26/241  soft-tissue]
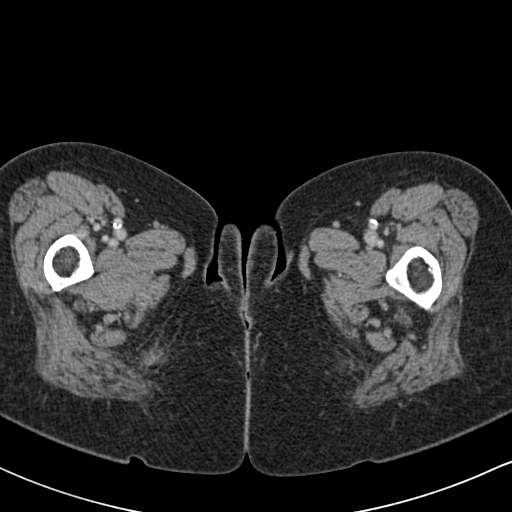
[im 51/241  soft-tissue]
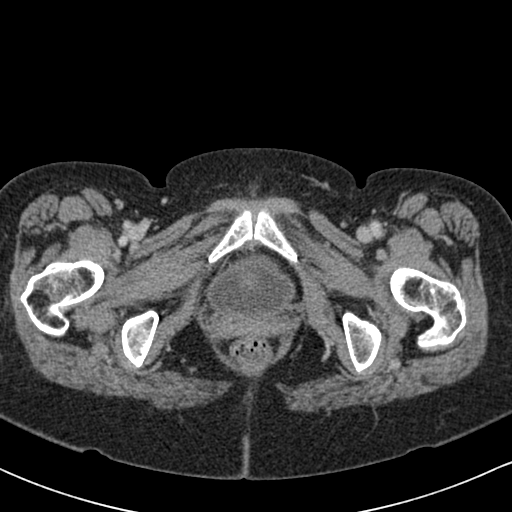
[im 64/241  soft-tissue]
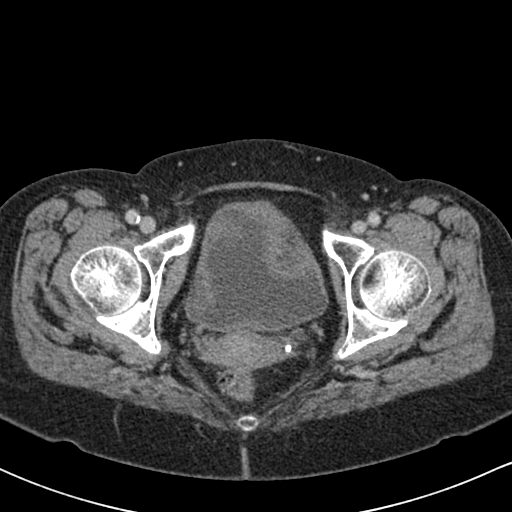
[im 76/241  soft-tissue]
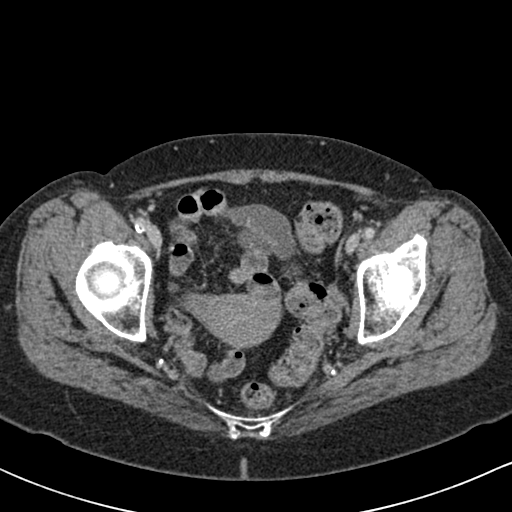
[im 102/241  soft-tissue]
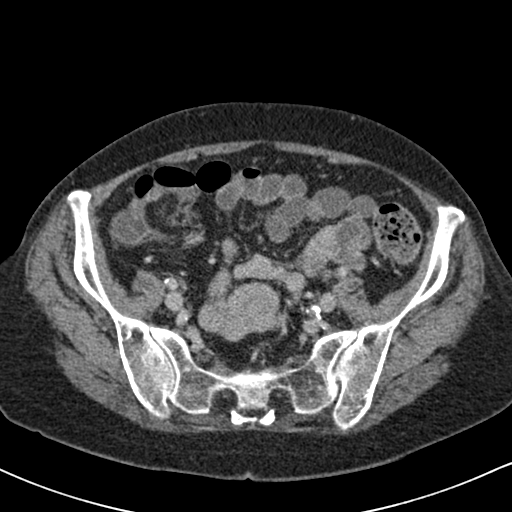
[im 114/241  soft-tissue]
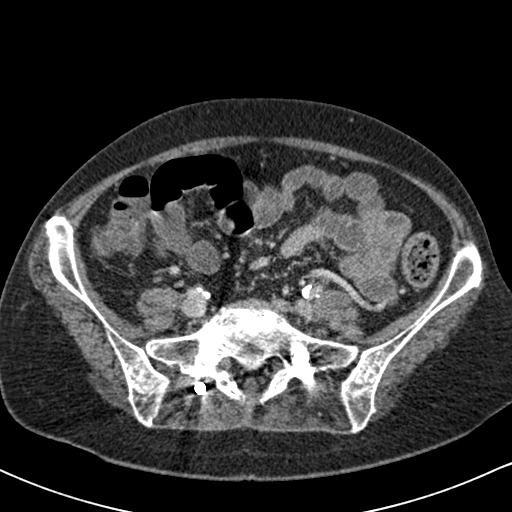
[im 127/241  soft-tissue]
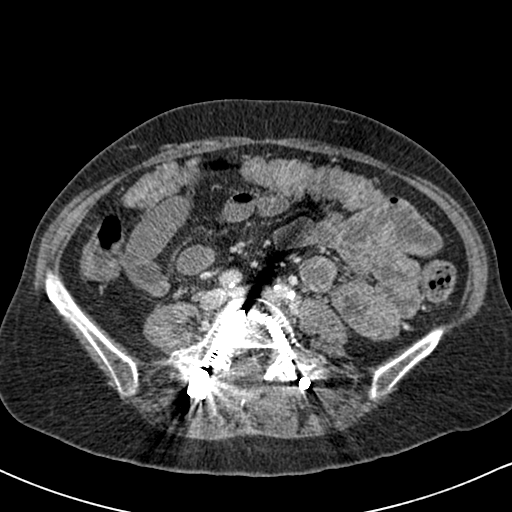
[im 139/241  soft-tissue]
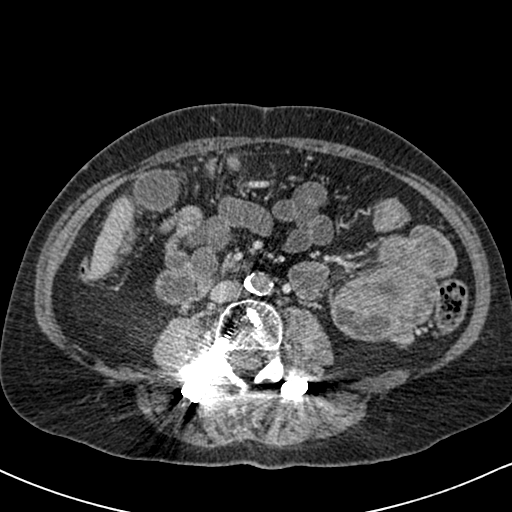
[im 139/241  bone]
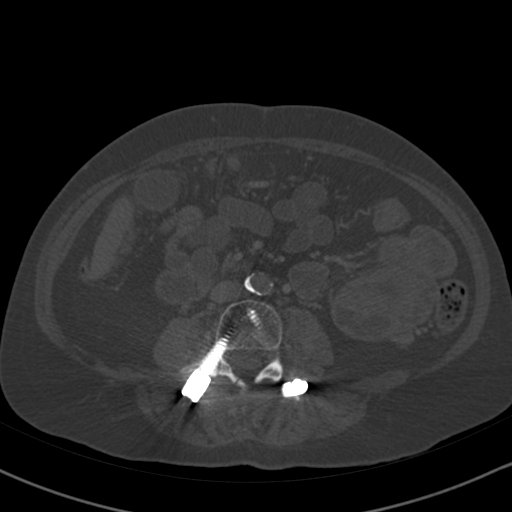
[im 165/241  soft-tissue]
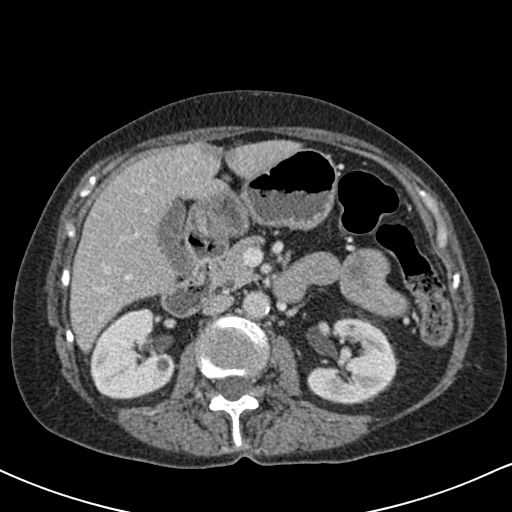
[im 177/241  soft-tissue]
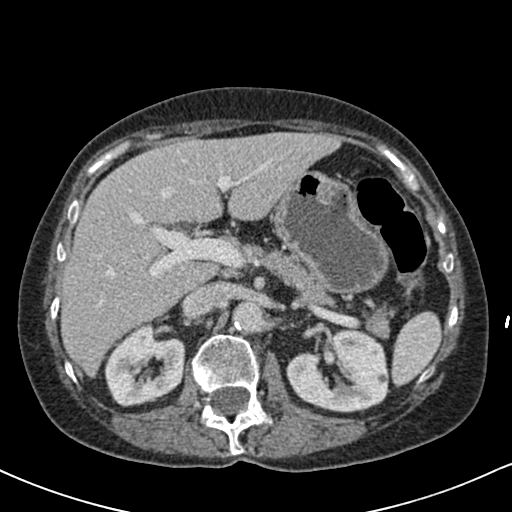
[im 190/241  soft-tissue]
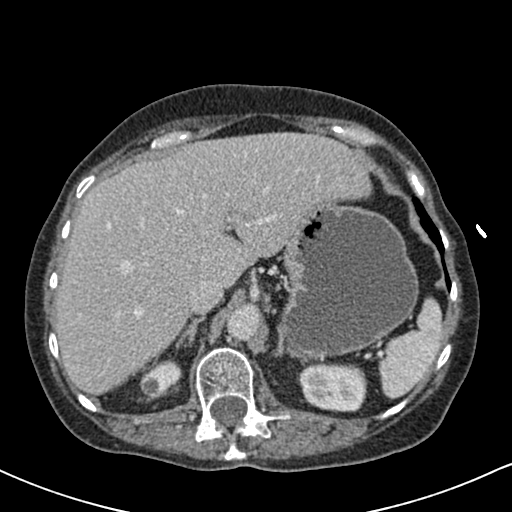
[im 215/241  soft-tissue]
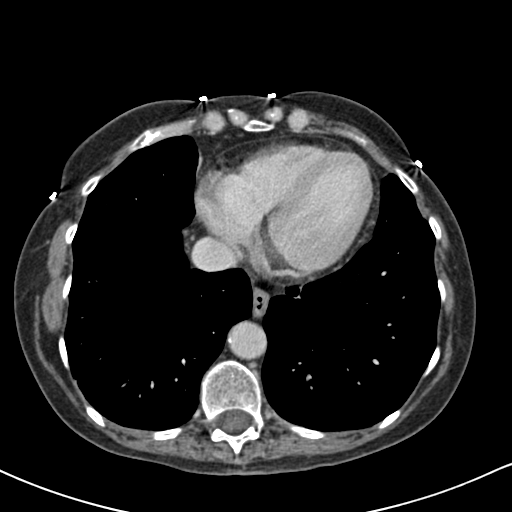
[im 228/241  soft-tissue]
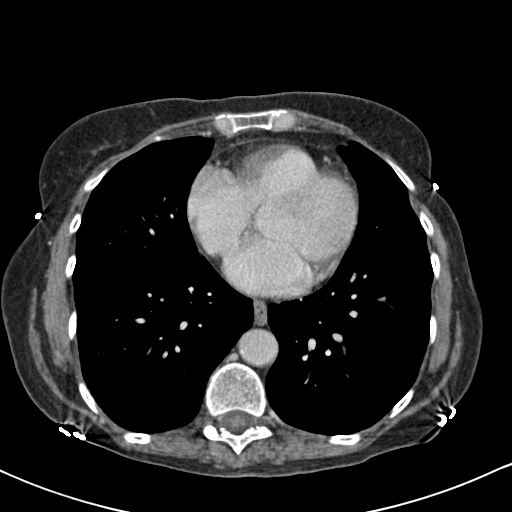

[Series 602: cor · coronal · 0.94mm/px · 3 of 96 slices shown]
[im 32/96  soft-tissue]
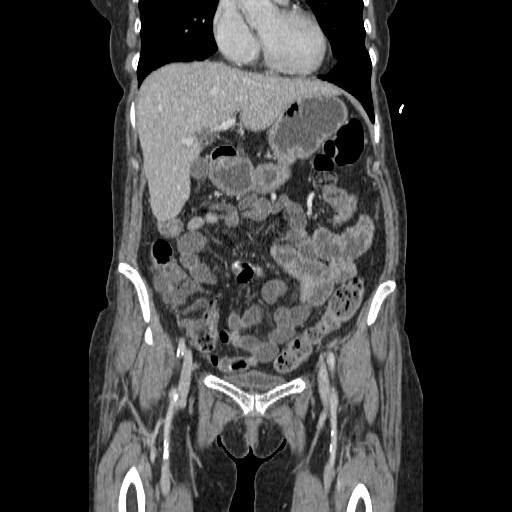
[im 43/96  soft-tissue]
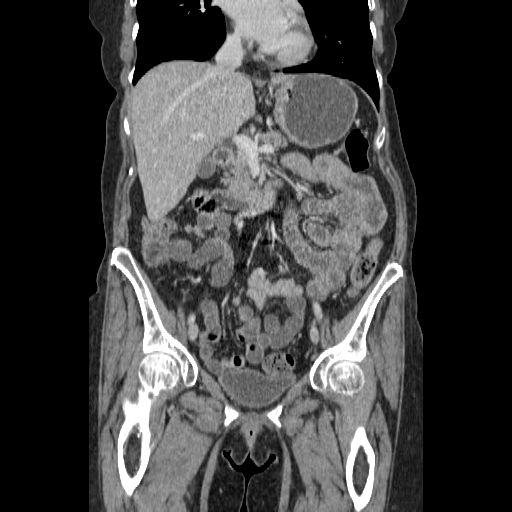
[im 53/96  soft-tissue]
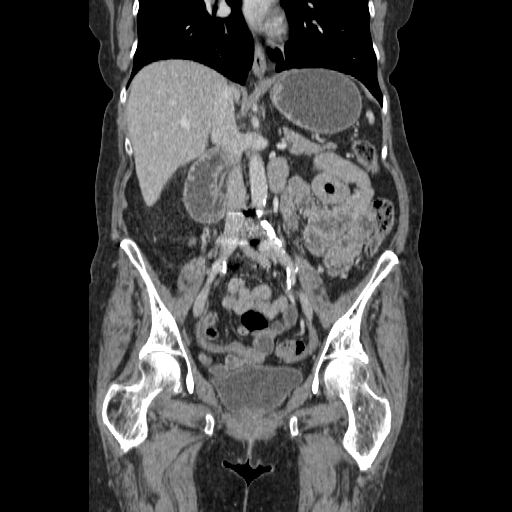

[17 of 46 positions shown; findings below may reference images not displayed]

FINDINGS: Clear lung bases.  Normal heart size without pericardial
or pleural effusion.  Tiny left hepatic lobe cyst. Focal steatosis
adjacent the falciform ligament.  Normal spleen, stomach.  A cystic
lesion in the dorsal pancreatic body measures 8 mm on image 26 and
is unchanged.  Normal gallbladder, biliary tract, adrenal glands.

Bilateral renal too small to characterize lesions.  Upper pole
right renal cyst.

No retroperitoneal or retrocrural adenopathy.

Moderate descending and sigmoid colonic stool burden.  The
transverse and hepatic flexure colon appear thick-walled.  Example
image 43 of series 2.  This is felt to be due to underdistension.
No surrounding edema.  Normal terminal ileum.  Mildly prominent
fatty ileocecal valve.

Small bowel distention with neutral contrast is moderate. No
evidence of small bowel dilatation, mass, wall thickening, or
mesenteric abnormality.

Age advanced aortic and branch vessel atherosclerosis. No pelvic
adenopathy.    Normal urinary bladder and uterus.  No adnexal mass.
No significant free fluid.  Prior lumbar spine fixation.
IMPRESSION: 1.  No acute process within small bowel.  No explanation for
patient's symptoms.
2.  Apparent hepatic flexure and proximal transverse colonic wall
thickening, felt to be due to underdistension.  Focal colitis
cannot be excluded but is felt less likely.
3.  Stable 8 mm cystic lesion in the pancreatic body.  Favor
pseudocyst.

## 2013-08-08 ENCOUNTER — Ambulatory Visit (INDEPENDENT_AMBULATORY_CARE_PROVIDER_SITE_OTHER): Payer: 59

## 2013-08-08 DIAGNOSIS — D649 Anemia, unspecified: Secondary | ICD-10-CM

## 2013-08-10 MED ORDER — CYANOCOBALAMIN 1000 MCG/ML IJ SOLN
1000.0000 ug | Freq: Once | INTRAMUSCULAR | Status: AC
  Administered 2013-08-10: 1000 ug via INTRAMUSCULAR

## 2013-08-15 ENCOUNTER — Ambulatory Visit (INDEPENDENT_AMBULATORY_CARE_PROVIDER_SITE_OTHER): Payer: 59 | Admitting: Pulmonary Disease

## 2013-08-15 ENCOUNTER — Other Ambulatory Visit (INDEPENDENT_AMBULATORY_CARE_PROVIDER_SITE_OTHER): Payer: 59

## 2013-08-15 ENCOUNTER — Ambulatory Visit (INDEPENDENT_AMBULATORY_CARE_PROVIDER_SITE_OTHER)
Admission: RE | Admit: 2013-08-15 | Discharge: 2013-08-15 | Disposition: A | Discharge status: Home / Self Care | Payer: 59 | Source: Ambulatory Visit | Attending: Pulmonary Disease | Admitting: Pulmonary Disease

## 2013-08-15 ENCOUNTER — Encounter: Payer: Self-pay | Admitting: Pulmonary Disease

## 2013-08-15 VITALS — BP 118/74 | HR 95 | Temp 98.5°F | Ht 64.0 in | Wt 121.2 lb

## 2013-08-15 DIAGNOSIS — K219 Gastro-esophageal reflux disease without esophagitis: Secondary | ICD-10-CM

## 2013-08-15 DIAGNOSIS — J42 Unspecified chronic bronchitis: Secondary | ICD-10-CM

## 2013-08-15 DIAGNOSIS — Z0000 Encounter for general adult medical examination without abnormal findings: Secondary | ICD-10-CM

## 2013-08-15 DIAGNOSIS — F17200 Nicotine dependence, unspecified, uncomplicated: Secondary | ICD-10-CM

## 2013-08-15 DIAGNOSIS — I872 Venous insufficiency (chronic) (peripheral): Secondary | ICD-10-CM

## 2013-08-15 DIAGNOSIS — F17210 Nicotine dependence, cigarettes, uncomplicated: Secondary | ICD-10-CM | POA: Insufficient documentation

## 2013-08-15 DIAGNOSIS — M549 Dorsalgia, unspecified: Secondary | ICD-10-CM

## 2013-08-15 DIAGNOSIS — E538 Deficiency of other specified B group vitamins: Secondary | ICD-10-CM

## 2013-08-15 DIAGNOSIS — M899 Disorder of bone, unspecified: Secondary | ICD-10-CM

## 2013-08-15 DIAGNOSIS — G894 Chronic pain syndrome: Secondary | ICD-10-CM

## 2013-08-15 DIAGNOSIS — F411 Generalized anxiety disorder: Secondary | ICD-10-CM

## 2013-08-15 DIAGNOSIS — K5730 Diverticulosis of large intestine without perforation or abscess without bleeding: Secondary | ICD-10-CM

## 2013-08-15 DIAGNOSIS — K589 Irritable bowel syndrome without diarrhea: Secondary | ICD-10-CM

## 2013-08-15 DIAGNOSIS — N200 Calculus of kidney: Secondary | ICD-10-CM

## 2013-08-15 LAB — URINALYSIS
Bilirubin Urine: NEGATIVE
Ketones, ur: NEGATIVE
Leukocytes, UA: NEGATIVE
Nitrite: NEGATIVE
Specific Gravity, Urine: <=1.005 (ref 1.000–1.030)
Total Protein, Urine: NEGATIVE
Urine Glucose: NEGATIVE
Urobilinogen, UA: 0.2 (ref 0.0–1.0)
pH: 6 (ref 5.0–8.0)

## 2013-08-15 LAB — HEPATIC FUNCTION PANEL
ALT: 15 U/L (ref 0–35)
AST: 27 U/L (ref 0–37)
Albumin: 4.2 g/dL (ref 3.5–5.2)
Alkaline Phosphatase: 86 U/L (ref 39–117)
Bilirubin, Direct: 0.1 mg/dL (ref 0.0–0.3)
Total Bilirubin: 0.8 mg/dL (ref 0.3–1.2)
Total Protein: 8.1 g/dL (ref 6.0–8.3)

## 2013-08-15 LAB — CBC WITH DIFFERENTIAL/PLATELET
Basophils Absolute: 0 10*3/uL (ref 0.0–0.1)
Basophils Relative: 0.4 % (ref 0.0–3.0)
Eosinophils Absolute: 0.1 10*3/uL (ref 0.0–0.7)
Eosinophils Relative: 1.4 % (ref 0.0–5.0)
HCT: 50.2 % — ABNORMAL HIGH (ref 36.0–46.0)
Hemoglobin: 17.3 g/dL — ABNORMAL HIGH (ref 12.0–15.0)
Lymphocytes Relative: 32.6 % (ref 12.0–46.0)
Lymphs Abs: 3.4 10*3/uL (ref 0.7–4.0)
MCHC: 34.4 g/dL (ref 30.0–36.0)
MCV: 97.9 fl (ref 78.0–100.0)
Monocytes Absolute: 0.8 10*3/uL (ref 0.1–1.0)
Monocytes Relative: 7.4 % (ref 3.0–12.0)
Neutro Abs: 6 10*3/uL (ref 1.4–7.7)
Neutrophils Relative %: 58.2 % (ref 43.0–77.0)
Platelets: 304 10*3/uL (ref 150.0–400.0)
RBC: 5.12 Mil/uL — ABNORMAL HIGH (ref 3.87–5.11)
RDW: 13.2 % (ref 11.5–14.6)
WBC: 10.4 10*3/uL (ref 4.5–10.5)

## 2013-08-15 LAB — LIPID PANEL
Cholesterol: 199 mg/dL (ref 0–200)
HDL: 77.4 mg/dL (ref >39.00)
LDL Cholesterol: 100 mg/dL — ABNORMAL HIGH (ref 0–99)
Total CHOL/HDL Ratio: 3
Triglycerides: 106 mg/dL (ref 0.0–149.0)
VLDL: 21.2 mg/dL (ref 0.0–40.0)

## 2013-08-15 LAB — BASIC METABOLIC PANEL
BUN: 7 mg/dL (ref 6–23)
CO2: 29 mEq/L (ref 19–32)
Calcium: 9.2 mg/dL (ref 8.4–10.5)
Chloride: 99 mEq/L (ref 96–112)
Creatinine, Ser: 0.6 mg/dL (ref 0.4–1.2)
GFR: 102.37 mL/min (ref >60.00)
Glucose, Bld: 78 mg/dL (ref 70–99)
Potassium: 4.2 mEq/L (ref 3.5–5.1)
Sodium: 135 mEq/L (ref 135–145)

## 2013-08-15 LAB — VITAMIN B12: Vitamin B-12: 612 pg/mL (ref 211–911)

## 2013-08-15 LAB — TSH: TSH: 0.92 u[IU]/mL (ref 0.35–5.50)

## 2013-08-15 MED ORDER — PANTOPRAZOLE SODIUM 40 MG PO TBEC: 40.0000 mg | DELAYED_RELEASE_TABLET | Freq: Every day | ORAL | Status: DC

## 2013-08-15 MED ORDER — DULOXETINE HCL 60 MG PO CPEP: ORAL_CAPSULE | ORAL | Status: DC

## 2013-08-15 MED ORDER — ALPRAZOLAM 0.5 MG PO TABS: 0.2500 mg | ORAL_TABLET | Freq: Every evening | ORAL | Status: DC | PRN

## 2013-08-15 NOTE — Progress Notes (Signed)
Subjective:     Patient ID: Denise King, female   DOB: May 14, 1951, 62 y.o.   MRN: [id number]  HPI 62 y/o WF here for a follow up visit... she has multiple medical problems as noted below...   ~  August 06, 2009:  had back surg w/ fusion by DrCohen at [facility] in Oct09... much difficulty after surg- out of work til 4/10 (ret part time) and 8/10 (just ret full time)... she is seeing DrPhillips for pain management now on EMBEDA 50-2 Bid + MSIR for breakthrough pain...  ~  January 01, 2012:  24moROV & it's been a [name] problems in several areas as reviewed below;  She also notes incr BP recently but she believes due to her incr pain level, and notes her pulse is always rapid> we discussed trial BBlocker for this w/ TOPROL XL 275md to start as she is scared of side effects of medications...    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    HBP> not currently on meds & she thinks hi readings at home are due to her pain; BP= 126/90 & pulse= 94 (readings higher at home); we decided on trial of low dose BBlocker ToprolXL 2580m as noted above...    GI> managed by DrKaplan & seen every 2-34mo234moe is c/o constant pain & diarrhea; now on Lotronex which seems to help the diarrhea but not her pain...    Chr Pain> Hx severe back problems- DrCohen, & pain managed by DrPhillips on Opana20Bid & Percocet10- now taking 5/d; "they can't go higher & pain is getting worse"...    Nerves/ Insomnia> DrKaplan started Xanax 1mgQ34m& she adds OTC Unisom25mgQ66mwe have filled her Rx for Cymbalta 60mgQh87m~  March 30, 2012:  34mo ROV79moere for recheck of her BP which is fair at 130/90 on Metoprolol25Bid, Lasix20/d, & K10/s; she notes that the BBlocker "adds another layer of tiredness" she says; we discussed adding LOSARTAN 50mg/d &70mking off the Metoprolol to 25mg/d (d12mding on her  response we may be able to incr the Losar & wean the Metop off)...  Her CC today however is abd pain & discomfort w/ nausea "mostly on left side but really all over abd"; she states this is different than her [name] symptoms; she reports going to [facility] (see [facility] 3/27 by DrDaub) & they felt she had prob diverticulitis & treated her w/ Cipro & Flagyl (CBC was normal w/ Hg=13.1, WBC=10.6 w/ 48% neutrophils, CMet-wnl, Amylase/Lipase-wnl, Urine-clear);  She reports NO BETTER after the antibiotics & reports bloating, constipated, w/ one sm BM daily ("I'm on the BRAT diet");  Past hx severe GERD w/ erosions on Protonix daily & last EGD 1/10 was neg;  Normal gastric emptying scan;  CTAbd 1/11 & 2/11 showed no acute dis just retained stool throughout colon & cyst in liver, kidney, & 8mm cystic12msion of the ventral pancreatic body (no change from 2006); symptoms felt to be secondary to narcotic analgesics she takes for her chronic back pain & chronic pain syndrome... Note: no divertics on CT Abd, but DrKaplan saw some on colonoscopy 1/12 (otherw unremarkable);  Last procedure was a CT Enterography 11/12 w/o acute process identified (the 8mm pseudoc59m in pancreas was seen);  She has PROTONIX, [place].  We discussed Rx w/ Anti-gas medications MYLICON &  PHAZYME, and to be sure she's not constip w/ her narcotic analgesics- rec to take Citrate of Magnesia + water, & Dulcolax if needed;  We will arrange for f/u w/ her gastroenterologist...    CT ABD 4/13 repeat==> neg for acute abn, but CBD is dil at 8.66mm (prev6.3mm)- no stones or obstructing mass seen; GB appears normal; incidental findings include some scarring at right base, 6mm left hep cyst, kidneys & pancreas appear normal, osteopenia & post op L4-5 laminectomy & fusion...    EGD 4/13 showed gastritis, bx= neg for HPylori...  ~  July 15, 2012:  12mo ROV & Denise King persists w/ mult somatic complaints> dizzy, sleepy, elevHR w/ palpit, nauseated,  4+stress (62y/o daugh w/ severe migraines); we decided to add low dose Atenolol 25mg - 1/2 to 1 tab for the HR & palpit...    She had seen [facility]Daub at [facility] w/ GI complaints> his eval is reviewed...    She then followed up w/ DrGessner for GI; followed by [facility] 5/13> Abd pain, diarrhea, gastritis on Protonix, [name] on Lotronex;  ?they stopped the Protonix because she was concerned about ospeopenia;     We reviewed prob list, meds, xrays and labs> see below>>  ~  August 15, 2013:  40yr ROV & CPX> Denise King is still smoking, denies resp difficulties & her CC remains her chr pain issues, managed by DrPhillips pain clinic;  We reviewed the following medical problems during today's office visit >>     Smoker> she continues smoking ~1ppd & denies any recent URIs, bronchitis, etc; CXR today showed COPD, bilat nipple shadows...    HBP> supposed to be on Aten25 & Cozaar50 but she stopped all meds; BP= 118/74 & she is encouraged to monitor BP at home, ok off meds at this point.    GI> managed by [name], she is c/o constant pain & diarrhea; now on Lotronex which seems to help the diarrhea, Levsin SL, Zofran prn nausea;  Rec to add Protonix40/d & f/u w/ GI...    Chr Pain> Hx severe back problems- DrCohen, & pain managed by DrPhillips on Opana30Bid & Percocet7.5; also on Cymbalta60; c/o right shoulder & knee today- offered Ortho eval.    Osteoporosis> followed by GYN (DrHolland) on Reclast infusion yearly; last BMD at [facility] showed TScore -3.4 in Spine...    Vit B12 Defic> on monthly B12 shots; Labs showed VitB12 level = 612...    Nerves/ Insomnia> on Xanax 0.5mg Tid & she adds OTC Unisom25mg Qhs; we have filled her Rx for Cymbalta 60mg Qhs; under incr stress caring for ill 11 y/o mother. We reviewed prob list, meds, xrays and labs> see below for updates >>  CXR 8/14 showed norm heart size, COPD/emphysema, nipple shadows bilat, mild scoliosis of spine => repeat w/ nipple markers. LABS 8/14:  FLP- at goals on diet  alone;  Chems- wnl;  CBC- ok x Hg=17.3;  TSH=0.92;  VitD=42;  VitB12=612;  UA=clear...            Problem List:  VERTIGO (ICD-780.4) - Hosp 8/08 w/ neg eval and eventually referred to Neurology w/ nothing found; Rx'd w/ Antivert, Phenergan, vestib therapy...  BRONCHITIS, RECURRENT (ICD-491.9) - 1ppd smoker w/ hx recurrent bronchitis... she refuses smoking cessation counseling, Chantix, etc... notes min cough, sputum & SOB- sedentary due to back prob... ~  CXR 2/12 showed normal heart size, COPD w/ sl incr markings, nipple shadows noted... 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 ~  CXR 8/14 showed norm heart size, COPD/emphysema, nipple shadows bilat, mild scoliosis of spine => repeat w/ nipple markers  She c/o heart rate too high >> we rec trial Aten 25mg - 1/2 to 1 tab daily 7/13... ~  EKG 2/14 showed NSR, rate84, wnl, NAD...  VENOUS INSUFFICIENCY (ICD-459.81) - she knows to avoid sodium, elevate legs, wear support hose... has LASIX 20mg  Prn w/ KCl .  REFLUX ESOPHAGITIS (ICD-530.11) >>  ~  EGD 9/05 by DrPerry showed mod severe reflux esoph w/ erosions,  Rx PROTONIX 40mg /d regularly w/ good control, but states she needs it Bid;  She uses ZOFRAN 4mg  as needed for nausea. ~  4/13:  She had EGD DrGessner w/ mod gastritis, Bx neg HPylori, Rx w/ Proptonix40...  DIVERTICULOSIS OF COLON (ICD-562.10), &  [name] (ICD-564.1) - on LEVSIN .125 SL Q4H prn> prev on Levbid & Librax... ~  extensive GI eval by DrKaplan 1/10 for abd pain, [name], etc... neg EGD, neg CTAbd, neg Emptying scan... believed secondary to narcotic analgesics... ~  on-going GI eval by DrKaplan w/ repeat CT Abd 2/11 (stable benign cysts liver/ pancreas/ renal), Colonoscopy 1/12 (sigm divertics & neg biopsies) etc... ~  Continued GI evaluation by DrKaplan in 2012 w/ ER visit 8/12 for [name] w/ N/ V; & Labs/ CT enterography, etc;  LOTRONEX helps her diarrhea + Probiotics. ~  4/13:  Recurrent severe abd  pain, bloating, nausea, etc> SEE ABOVE & referred back to GI DrKaplan... ~  2/14:  DrHolland did diagnostic laparoscopy to try to finf reason for her LLQ abd pain> few adhesions, otherw neg; remarkably pt noted decr pain for 63mo after the procedure (but upset that it cost her $1600)...  COLONIC POLYPS (ICD-211.3) >>  ~  prev colonoscopy 2/06 by [name] showed Divertic, otherw neg; f/u 5yrs w/ +fam hx colon ca. ~  as above> f/u colon 1/12 w/ divertics otherw neg... ~  CT Abd 2/14 showed mod stool burden, smHH, 6mm hep cyst on left, sm bilat renal cysts, prior appy, atherosclerotic calci Ao, spine surg....  CALCULUS, KIDNEY (ICD-592.0) - Hx of Kidney Stones & small renal cysts on CT scan & sonar - followed by DrWrenn.  BACK PAIN, LUMBAR (ICD-724.2) - initial eval and rx by [facility] w/ DDD and no surg possible... she's had series of epidural shots from DrStrong without relief... additional opinion from [facility]- surg not recommended... referred to a chronic pain clinic... ~  fall w/ injury 2009 & further eval from DrCohen- she decided to proceed w/ spinal surgery & fusion by DrCohen 10/09 at [facility] hosp... much difficulty post op & now followed in the pain management clinic by DrPhillips. ~  she reports another opinion from DrBranch at [facility]- "he is considering additional surg"...  CHRONIC PAIN SYNDROME (ICD-338.4) - followed by drPhillips pain clinic... currently on OPANA ER 20mg  Bid & PERCOCET10- up to 5 daily... also on CYMBALTA 60mg /d...  OSTEOPENIA (ICD-733.90) - she was prev on EVISTA, Ca++, Vits (w/ BMD per GYN)... she stopped the Evista due to nausea which she feels is better off this med... f/u by [facility] & pt reports osteoporosis w/ RECLAST infusion... ~  labs 3/09 showed Vit d level = 12... started on Vit D 50000 u weekly. ~  labs 6/10 showed Vit D level = 49... OK to switch to 1000 u OTC daily. ~  f/u GYN eval and BMD at [facility] office> she reports Rx w/ RECLAST for osteoposis (we  don't have records).  HEADACHE (ICD-784.0)  ANXIETY (ICD-300.00) - prev  on Lexapro & Xanax, but these were stopped in favor of CYMBALTA 60mg /d; prev on Lunesta but DrKaplan switched her to [facility] 1mg Qhs & she added Unisom 25mg  OTC...  VITAMIN B12 DEFICIENCY (ICD-266.2) -  ~  labs 1010 showed Vit B12 level = 118 & started on B 12 shots monthly...  ~  labs 2/11 showed Vit B12 level = >1500... for some reason she stopped the B12 shots in 2011. ~  labs 2/12 showed Vit B12 level = 213... rec to resume B12 shots monthly. ~  Labs 8/12 by [name] showed Vit B12 level = 517, Folate = 6.1 ~  Labs 3/13 showed B12 level = 361  HEALTH MAINTENANCE:  GYN= DrHolland & PAP, Mammogram, etc... all up-to-date; she is due a BMD in 1/10; and colon 10/11...   Past Surgical History  Procedure Laterality Date  . Appendectomy    . Tubal ligation    . Back surgery  10-09    Dr. Noel Gerold  . Tonsillectomy    . Laparoscopy N/A 02/21/2013    Procedure: LAPAROSCOPY OPERATIVE;  Surgeon: Meriel Pica, MD;  Location: [facility];  Service: Gynecology;  Laterality: N/A;  REQUESTING SCOPE WITH CAMERA    Outpatient Encounter Prescriptions as of 08/15/2013  Medication Sig Dispense Refill  . alosetron (LOTRONEX) 1 MG tablet Take 1 mg by mouth daily as needed (for diarrhea).       . ALPRAZolam (XANAX) 0.5 MG tablet Take 0.25-0.5 mg by mouth at bedtime as needed for sleep or anxiety.      Marland Kitchen atenolol (TENORMIN) 25 MG tablet Take 12.5-25 mg by mouth daily.      . chlorzoxazone (PARAFON) 500 MG tablet Take 1 tablet by mouth Three times daily as needed for muscle spasms.       . cyanocobalamin (,VITAMIN B-12,) 1000 MCG/ML injection Inject 1,000 mcg into the muscle every 30 (thirty) days.      . Doxylamine Succinate, Sleep, (UNISOM) 25 MG tablet Take 25 mg by mouth at bedtime.       . DULoxetine (CYMBALTA) 60 MG capsule TAKE 1 CAPSULE EVERY DAY.  30 capsule  1  . hyoscyamine (LEVSIN SL) 0.125 MG SL tablet Place 1 tablet under  the tongue Every 4 hours as needed for cramping.       Marland Kitchen losartan (COZAAR) 50 MG tablet Take 1 tablet (50 mg total) by mouth daily.  30 tablet  11  . Multiple Vitamins-Minerals (MULTIVITAMIN WITH MINERALS) tablet Take 1 tablet by mouth daily.        . ondansetron (ZOFRAN) 4 MG tablet Take 4 mg by mouth every 4 (four) hours as needed for nausea.      Marland Kitchen oxyCODONE-acetaminophen (PERCOCET) 7.5-325 MG per tablet Take 1 tablet by mouth every 8 (eight) hours as needed for pain.      Marland Kitchen oxymorphone (OPANA ER) 30 MG 12 hr tablet Take 30 mg by mouth every 12 (twelve) hours.      . zoledronic acid (RECLAST) 5 MG/100ML SOLN Inject 5 mg into the vein once. Patient is due for one when she feels better       No facility-administered encounter medications on file as of 08/15/2013.    Allergies  Allergen Reactions  . Atorvastatin Nausea And Vomiting  . Levofloxacin     Reaction: achilles tendon pain  . Omnicef [Cefdinir] Diarrhea    Uncontrollable diarrhea  . Trazodone And Nefazodone     "Felt like I was going to faint"  . Sulfonamide  Derivatives Rash    Current Medications, Allergies, Past Medical History, Past Surgical History, Family History, and Social History were reviewed in [employer] record.    Review of Systems        See HPI - all other systems neg except as noted...  The patient complains of dyspnea on exertion, headaches, abdominal pain, and depression.  The patient denies anorexia, fever, weight loss, weight gain, vision loss, decreased hearing, hoarseness, chest pain, syncope, peripheral edema, prolonged cough, hemoptysis, melena, hematochezia, severe indigestion/heartburn, hematuria, incontinence, muscle weakness, suspicious skin lesions, transient blindness, difficulty walking, unusual weight change, abnormal bleeding, enlarged lymph nodes, and angioedema.     Objective:   Physical Exam     WD, WN, 62 y/o WF in NAD... GENERAL:  Alert & oriented; pleasant &  cooperative... HEENT:  [place]/AT,  EACs-clear, TMs-wnl, NOSE-clear, THROAT-clear but dryMMs. NECK:  Supple w/ fairROM; no JVD; normal carotid impulses w/o bruits; no thyromegaly or nodules palpated; no lymphadenopathy. CHEST:  Clear to P & A; without wheezes/ rales/ or rhonchi heard... HEART:  Regular Rhythm; without murmurs/ rubs/ or gallops detected... ABDOMEN:  Soft & mild tender diffusely in abd; normal bowel sounds; no organomegaly or masses palpated... EXT: without deformities, mild arthritic changes; no varicose veins/ +venous insuffic/ no edema.,  limp w/ walking.  BACK:  c/o mod severe back pain... NEURO:  no focal neuro deficits... DERM:  no lesions seen...  RADIOLOGY DATA:  Reviewed in the EPIC EMR & discussed w/ the patient...    >>Last CXR 2/12 showed underlying COPD w/ sl incr markings, NAD...    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  LABORATORY DATA:  Reviewed in the EPIC EMR & discussed w/ the patient...   Assessment:     Abd Pain, Bloating, etc>> GERD, Divertics, [name], +FamHx colon ca>  On Protonix, Levsin, Lotronex, Zofran, Align; we discussed trying Anti-gas medication w/ MYLICON & PHAZYME, plus r/o incr fecal burden/constipation w/ laxative regimen (MagCitrate + Dulcolax); with her degree of severity & complexity she needs subspecialty involvement I believe & we will set up appt w/ GI ==> seen by DrGessner & DrKaplan...  HBP & elev HR>  We decided to try a diff BBlocker- Aten25mg  (1/2 to 1 tab daily) but she's stopped all BP meds and BP remains wnl on diet alone now...  Smoker, Chr Bronchitis>  Must quit all smoking but she refuses smoking cessation help; doesn't feel her breathing is bad enough for chronic meds/ inhalers/ etc...  Ven Insuffic>  On low sodium diet & has Lasix20 + K10 to use as needed...  Hx Kidney Stones>  Followed by DrWrenn...  DJD, LBP>  Followed by [facility] & DrCohen who did surg in [place] 10/09 (she has also seen  DrNudelman & DrBranch at [facility])...  Chronic Pain Syndrome>  Managed by DrPhillips pain clinic on meds as noted...  Anxiety & Insomnia>  On meds listed...  Vit B12 Defic>  On B12 shots monthly...     Plan:     Patient's Medications  New Prescriptions   No medications on file  Previous Medications   ALOSETRON (LOTRONEX) 1 MG TABLET    Take 1 mg by mouth daily as needed (for diarrhea).    ALPRAZOLAM (XANAX) 0.5 MG TABLET    Take 0.25-0.5 mg by mouth at bedtime as needed for sleep or anxiety.   ATENOLOL (TENORMIN) 25 MG TABLET    Take 12.5-25 mg by mouth daily.  CHLORZOXAZONE (PARAFON) 500 MG TABLET    Take 1 tablet by mouth Three times daily as needed for muscle spasms.    CYANOCOBALAMIN (,VITAMIN B-12,) 1000 MCG/ML INJECTION    Inject 1,000 mcg into the muscle every 30 (thirty) days.   DOXYLAMINE SUCCINATE, SLEEP, (UNISOM) 25 MG TABLET    Take 25 mg by mouth at bedtime.    DULOXETINE (CYMBALTA) 60 MG CAPSULE    TAKE 1 CAPSULE EVERY DAY.   HYOSCYAMINE (LEVSIN SL) 0.125 MG SL TABLET    Place 1 tablet under the tongue Every 4 hours as needed for cramping.    LOSARTAN (COZAAR) 50 MG TABLET    Take 1 tablet (50 mg total) by mouth daily.   MULTIPLE VITAMINS-MINERALS (MULTIVITAMIN WITH MINERALS) TABLET    Take 1 tablet by mouth daily.     ONDANSETRON (ZOFRAN) 4 MG TABLET    Take 4 mg by mouth every 4 (four) hours as needed for nausea.   OXYCODONE-ACETAMINOPHEN (PERCOCET) 7.5-325 MG PER TABLET    Take 1 tablet by mouth every 8 (eight) hours as needed for pain.   OXYMORPHONE (OPANA ER) 30 MG 12 HR TABLET    Take 30 mg by mouth every 12 (twelve) hours.   ZOLEDRONIC ACID (RECLAST) 5 MG/100ML SOLN    Inject 5 mg into the vein once. Patient is due for one when she feels better  Modified Medications   No medications on file  Discontinued Medications   No medications on file

## 2013-08-15 NOTE — Patient Instructions (Addendum)
Today we updated your med list in our EPIC system...    Continue your current medications the same...    We refilled your meds per request...  Today we did your follow up CXR & FASTING blood work...    We will contact you w/ the results when available...   Please work on smoking cessation, and increase your caloric intake to gain a few lbs...  We will sched a GI follow up appt for you...  Call for any questions...  Let's plan a follow up visit in 96yr, sooner if needed for any problems.Marland KitchenMarland Kitchen

## 2013-08-16 ENCOUNTER — Other Ambulatory Visit: Payer: Self-pay | Admitting: Pulmonary Disease

## 2013-08-16 DIAGNOSIS — J42 Unspecified chronic bronchitis: Secondary | ICD-10-CM

## 2013-08-16 LAB — VITAMIN D 25 HYDROXY (VIT D DEFICIENCY, FRACTURES): Vit D, 25-Hydroxy: 42 ng/mL (ref 30–89)

## 2013-10-02 ENCOUNTER — Other Ambulatory Visit: Payer: Self-pay | Admitting: Sports Medicine

## 2013-10-02 DIAGNOSIS — M549 Dorsalgia, unspecified: Secondary | ICD-10-CM

## 2013-10-03 ENCOUNTER — Ambulatory Visit
Admission: RE | Admit: 2013-10-03 | Discharge: 2013-10-03 | Disposition: A | Discharge status: Home / Self Care | Payer: 59 | Source: Ambulatory Visit | Attending: Sports Medicine | Admitting: Sports Medicine

## 2013-10-03 DIAGNOSIS — M549 Dorsalgia, unspecified: Secondary | ICD-10-CM

## 2013-10-06 ENCOUNTER — Telehealth: Payer: Self-pay | Admitting: Pulmonary Disease

## 2013-10-06 MED ORDER — AZITHROMYCIN 250 MG PO TABS: ORAL_TABLET | ORAL | Status: DC

## 2013-10-06 MED ORDER — NEOMYCIN-POLYMYXIN-HC 3.5-10000-1 OT SOLN: OTIC | Status: DC

## 2013-10-06 NOTE — Telephone Encounter (Signed)
Returning call.Denise King ° °

## 2013-10-06 NOTE — Telephone Encounter (Signed)
Spoke with the pt  She is c/o sore throat and ear pain- only on the left side x 1 wk  She denies f/c/s, cough, wheezing, SOB or any other co's  Would like something called in Declined appt  Please advise, thanks! Last ov 08/15/13 and no appt pending  Allergies  Allergen Reactions  . Atorvastatin Nausea And Vomiting  . Levofloxacin     Reaction: achilles tendon pain  . Omnicef [Cefdinir] Diarrhea    Uncontrollable diarrhea  . Trazodone And Nefazodone     "Felt like I was going to faint"  . Sulfonamide Derivatives Rash

## 2013-10-06 NOTE — Telephone Encounter (Signed)
Called, spoke with pt.  Informed her of below recs per Dr. Kriste Basque.  She verbalized understanding of this, is aware rx sent to [facility], and is to call back if symptoms do not improve or worsen.

## 2013-10-06 NOTE — Telephone Encounter (Signed)
Per SN---  zpak #1  Take as directed Cortisporin otic suspension 1 vial 2-3 drops in affected ear TID Called and lmomtcb for the pt.  i have sent these meds in for her.

## 2013-11-16 ENCOUNTER — Encounter: Payer: Self-pay | Admitting: Adult Health

## 2013-11-16 ENCOUNTER — Ambulatory Visit (INDEPENDENT_AMBULATORY_CARE_PROVIDER_SITE_OTHER): Payer: 59 | Admitting: Adult Health

## 2013-11-16 VITALS — BP 110/72 | HR 97 | Temp 98.9°F | Ht 64.0 in | Wt 121.0 lb

## 2013-11-16 DIAGNOSIS — F3289 Other specified depressive episodes: Secondary | ICD-10-CM

## 2013-11-16 DIAGNOSIS — E538 Deficiency of other specified B group vitamins: Secondary | ICD-10-CM

## 2013-11-16 DIAGNOSIS — Z23 Encounter for immunization: Secondary | ICD-10-CM

## 2013-11-16 DIAGNOSIS — F329 Major depressive disorder, single episode, unspecified: Secondary | ICD-10-CM

## 2013-11-16 MED ORDER — SERTRALINE HCL 50 MG PO TABS: 50.0000 mg | ORAL_TABLET | Freq: Every day | ORAL | Status: DC

## 2013-11-16 MED ORDER — CYANOCOBALAMIN 1000 MCG/ML IJ SOLN
1000.0000 ug | Freq: Once | INTRAMUSCULAR | Status: AC
  Administered 2013-11-16: 1000 ug via INTRAMUSCULAR

## 2013-11-16 NOTE — Assessment & Plan Note (Signed)
Begin Zoloft 50mg  daily .  Call back if you change your mind on referral to psychology .  Flu shot today.  Stress reducers.  Follow up Dr. Kriste Basque  In 6weeks and As needed

## 2013-11-16 NOTE — Progress Notes (Signed)
Subjective:     Patient ID: Denise King, female   DOB: 13-Nov-1951, 62 y.o.   MRN: [id number]  HPI 62 y/o WF    ~  August 06, 2009:  had back surg w/ fusion by DrCohen at [facility] in Oct09... much difficulty after surg- out of work til 4/10 (ret part time) and 8/10 (just ret full time)... she is seeing DrPhillips for pain management now on EMBEDA 50-2 Bid + MSIR for breakthrough pain...  ~  January 01, 2012:  247moROV & it's been a [facility]/ problems in several areas as reviewed below;  She also notes incr BP recently but she believes due to her incr pain level, and notes her pulse is always rapid> we discussed trial BBlocker for this w/ TOPROL XL 273md to start as she is scared of side effects of medications...    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    HBP> not currently on meds & she thinks hi readings at home are due to her pain; BP= 126/90 & pulse= 94 (readings higher at home); we decided on trial of low dose BBlocker ToprolXL 2550m as noted above...    GI> managed by DrKaplan & seen every 2-47mo82moe is c/o constant pain & diarrhea; now on Lotronex which seems to help the diarrhea but not her pain...    Chr Pain> Hx severe back problems- DrCohen, & pain managed by DrPhillips on Opana20Bid & Percocet10- now taking 5/d; "they can't go higher & pain is getting worse"...    Nerves/ Insomnia> DrKaplan started Xanax 1mgQ68m& she adds OTC Unisom25mgQ81mwe have filled her Rx for Cymbalta 60mgQh9m~  March 30, 2012:  47mo ROV35moere for recheck of her BP which is fair at 130/90 on Metoprolol25Bid, Lasix20/d, & K10/s; she notes that the BBlocker "adds another layer of tiredness" she says; we discussed adding LOSARTAN 50mg/d &21mking off the Metoprolol to 25mg/d (d42mding on her response we may be able to incr the Losar & wean the Metop off)...  Her CC today  however is abd pain & discomfort w/ nausea "mostly on left side but really all over abd"; she states this is different than her [name] symptoms; she reports going to [facility] (see [facility] 3/27 by DrDaub) & they felt she had prob diverticulitis & treated her w/ Cipro & Flagyl (CBC was normal w/ Hg=13.1, WBC=10.6 w/ 48% neutrophils, CMet-wnl, Amylase/Lipase-wnl, Urine-clear);  She reports NO BETTER after the antibiotics & reports bloating, constipated, w/ one sm BM daily ("I'm on the BRAT diet");  Past hx severe GERD w/ erosions on Protonix daily & last EGD 1/10 was neg;  Normal gastric emptying scan;  CTAbd 1/11 & 2/11 showed no acute dis just retained stool throughout colon & cyst in liver, kidney, & 8mm cystic70msion of the ventral pancreatic body (no change from 2006); symptoms felt to be secondary to narcotic analgesics she takes for her chronic back pain & chronic pain syndrome... Note: no divertics on CT Abd, but DrKaplan saw some on colonoscopy 1/12 (otherw unremarkable);  Last procedure was a CT Enterography 11/12 w/o acute process identified (the 8mm pseudoc51m in pancreas was seen);  She has PROTONIX, [place].  We discussed Rx w/ Anti-gas medications MYLICON & PHKenvilsure she's not constip w/ her narcotic analgesics- rec  to take Citrate of Magnesia + water, & Dulcolax if needed;  We will arrange for f/u w/ her gastroenterologist...    CT ABD 4/13 repeat==> neg for acute abn, but CBD is dil at 8.70mm (prev6.3mm)- no stones or obstructing mass seen; GB appears normal; incidental findings include some scarring at right base, 6mm left hep cyst, kidneys & pancreas appear normal, osteopenia & post op L4-5 laminectomy & fusion...    EGD 4/13 showed gastritis, bx= neg for HPylori...  ~  July 15, 2012:  77mo ROV & [name] persists w/ mult somatic complaints> dizzy, sleepy, elevHR w/ palpit, nauseated, 4+stress (62y/o daugh w/ severe migraines); we decided to add low dose Atenolol 25mg -  1/2 to 1 tab for the HR & palpit...    She had seen [facility]Daub at [facility] w/ GI complaints> his eval is reviewed...    She then followed up w/ DrGessner for GI; followed by [facility] 5/13> Abd pain, diarrhea, gastritis on Protonix, [name] on Lotronex;  ?they stopped the Protonix because she was concerned about ospeopenia;     We reviewed prob list, meds, xrays and labs> see below>>  ~  August 15, 2013:  66yr ROV & CPX> [name] is still smoking, denies resp difficulties & her CC remains her chr pain issues, managed by DrPhillips pain clinic;  We reviewed the following medical problems during today's office visit >>     Smoker> she continues smoking ~1ppd & denies any recent URIs, bronchitis, etc; CXR today showed COPD, bilat nipple shadows...    HBP> supposed to be on Aten25 & Cozaar50 but she stopped all meds; BP= 118/74 & she is encouraged to monitor BP at home, ok off meds at this point.    GI> managed by [name], she is c/o constant pain & diarrhea; now on Lotronex which seems to help the diarrhea, Levsin SL, Zofran prn nausea;  Rec to add Protonix40/d & f/u w/ GI...    Chr Pain> Hx severe back problems- DrCohen, & pain managed by DrPhillips on Opana30Bid & Percocet7.5; also on Cymbalta60; c/o right shoulder & knee today- offered Ortho eval.    Osteoporosis> followed by GYN (DrHolland) on Reclast infusion yearly; last BMD at [facility] showed TScore -3.4 in Spine...    Vit B12 Defic> on monthly B12 shots; Labs showed VitB12 level = 612...    Nerves/ Insomnia> on Xanax 0.5mg Tid & she adds OTC Unisom25mg Qhs; we have filled her Rx for Cymbalta 60mg Qhs; under incr stress caring for ill 79 y/o mother. We reviewed prob list, meds, xrays and labs> see below for updates >>  CXR 8/14 showed norm heart size, COPD/emphysema, nipple shadows bilat, mild scoliosis of spine => repeat w/ nipple markers. LABS 8/14:  FLP- at goals on diet alone;  Chems- wnl;  CBC- ok x Hg=17.3;  TSH=0.92;  VitD=42;  VitB12=612;   UA=clear...   11/16/2013 Acute OV  Complains of depression x5 weeks - doesn't feel like leaving home, no energy, decreased appetite, not sleeping well.  does admit to some suicidal thoughts.   constant pain wears on her.  Had recent fall at home-tripped  with T3-4 spinous process fx.  Seen by Ortho. Out of work.  Feeling sad a lot. Husband is supportive. Kids supportive.  Has loss of interest in activities Some anxiety , takes xanax on/off. Uses it for sleep issues as well .  Mother is NH.  No suicidal ideations.  Has chronic pain - followed at pain clinic  On Opana, Percocet and cymbalta .  Needs B12 shot today.              Problem List:  VERTIGO (ICD-780.4) - Hosp 8/08 w/ neg eval and eventually referred to Neurology w/ nothing found; Rx'd w/ Antivert, Phenergan, vestib therapy...  BRONCHITIS, RECURRENT (ICD-491.9) - 1ppd smoker w/ hx recurrent bronchitis... she refuses smoking cessation counseling, Chantix, etc... notes min cough, sputum & SOB- sedentary due to back prob... ~  CXR 2/12 showed normal heart size, COPD w/ sl incr markings, nipple shadows noted... 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 ~  CXR 8/14 showed norm heart size, COPD/emphysema, nipple shadows bilat, mild scoliosis of spine => repeat w/ nipple markers  She c/o heart rate too high >> we rec trial Aten 25mg - 1/2 to 1 tab daily 7/13... ~  EKG 2/14 showed NSR, rate84, wnl, NAD...  VENOUS INSUFFICIENCY (ICD-459.81) - she knows to avoid sodium, elevate legs, wear support hose... has LASIX 20mg  Prn w/ KCl .  REFLUX ESOPHAGITIS (ICD-530.11) >>  ~  EGD 9/05 by DrPerry showed mod severe reflux esoph w/ erosions,  Rx PROTONIX 40mg /d regularly w/ good control, but states she needs it Bid;  She uses ZOFRAN 4mg  as needed for nausea. ~  4/13:  She had EGD DrGessner w/ mod gastritis, Bx neg HPylori, Rx w/ Proptonix40...  DIVERTICULOSIS OF COLON (ICD-562.10), &  [name] (ICD-564.1) -  on LEVSIN .125 SL Q4H prn> prev on Levbid & Librax... ~  extensive GI eval by DrKaplan 1/10 for abd pain, [name], etc... neg EGD, neg CTAbd, neg Emptying scan... believed secondary to narcotic analgesics... ~  on-going GI eval by DrKaplan w/ repeat CT Abd 2/11 (stable benign cysts liver/ pancreas/ renal), Colonoscopy 1/12 (sigm divertics & neg biopsies) etc... ~  Continued GI evaluation by DrKaplan in 2012 w/ ER visit 8/12 for [name] w/ N/ V; & Labs/ CT enterography, etc;  LOTRONEX helps her diarrhea + Probiotics. ~  4/13:  Recurrent severe abd pain, bloating, nausea, etc> SEE ABOVE & referred back to GI DrKaplan... ~  2/14:  DrHolland did diagnostic laparoscopy to try to finf reason for her LLQ abd pain> few adhesions, otherw neg; remarkably pt noted decr pain for 59mo after the procedure (but upset that it cost her $1600)...  COLONIC POLYPS (ICD-211.3) >>  ~  prev colonoscopy 2/06 by [name] showed Divertic, otherw neg; f/u 51yrs w/ +fam hx colon ca. ~  as above> f/u colon 1/12 w/ divertics otherw neg... ~  CT Abd 2/14 showed mod stool burden, smHH, 6mm hep cyst on left, sm bilat renal cysts, prior appy, atherosclerotic calci Ao, spine surg....  CALCULUS, KIDNEY (ICD-592.0) - Hx of Kidney Stones & small renal cysts on CT scan & sonar - followed by DrWrenn.  BACK PAIN, LUMBAR (ICD-724.2) - initial eval and rx by [facility] w/ DDD and no surg possible... she's had series of epidural shots from DrStrong without relief... additional opinion from [facility]- surg not recommended... referred to a chronic pain clinic... ~  fall w/ injury 2009 & further eval from DrCohen- she decided to proceed w/ spinal surgery & fusion by DrCohen 10/09 at [facility]... much difficulty post op & now followed in the pain management clinic by DrPhillips. ~  she reports another opinion from DrBranch at [facility]- "he is considering additional surg"...  CHRONIC PAIN SYNDROME (ICD-338.4) - followed by drPhillips pain clinic... currently on  OPANA ER 20mg  Bid & PERCOCET10- up to 5 daily... also on  CYMBALTA 60mg /d...  OSTEOPENIA (ICD-733.90) - she was prev on EVISTA, Ca++, Vits (w/ BMD per GYN)... she stopped the Evista due to nausea which she feels is better off this med... f/u by [facility] & pt reports osteoporosis w/ RECLAST infusion... ~  labs 3/09 showed Vit d level = 12... started on Vit D 50000 u weekly. ~  labs 6/10 showed Vit D level = 49... OK to switch to 1000 u OTC daily. ~  f/u GYN eval and BMD at [facility] office> she reports Rx w/ RECLAST for osteoposis (we don't have records).  HEADACHE (ICD-784.0)  ANXIETY (ICD-300.00) - prev on Lexapro & Xanax, but these were stopped in favor of CYMBALTA 60mg /d; prev on Lunesta but DrKaplan switched her to [facility] 1mg Qhs & she added Unisom 25mg  OTC...  VITAMIN B12 DEFICIENCY (ICD-266.2) -  ~  labs 1010 showed Vit B12 level = 118 & started on B 12 shots monthly...  ~  labs 2/11 showed Vit B12 level = >1500... for some reason she stopped the B12 shots in 2011. ~  labs 2/12 showed Vit B12 level = 213... rec to resume B12 shots monthly. ~  Labs 8/12 by [name] showed Vit B12 level = 517, Folate = 6.1 ~  Labs 3/13 showed B12 level = 361  HEALTH MAINTENANCE:  GYN= DrHolland & PAP, Mammogram, etc... all up-to-date; she is due a BMD in 1/10; and colon 10/11...   Past Surgical History  Procedure Laterality Date  . Appendectomy    . Tubal ligation    . Back surgery  10-09    Dr. Noel Gerold  . Tonsillectomy    . Laparoscopy N/A 02/21/2013    Procedure: LAPAROSCOPY OPERATIVE;  Surgeon: Meriel Pica, MD;  Location: [facility];  Service: Gynecology;  Laterality: N/A;  REQUESTING SCOPE WITH CAMERA    Outpatient Encounter Prescriptions as of 11/16/2013  Medication Sig  . alosetron (LOTRONEX) 1 MG tablet Take 1 mg by mouth daily as needed (for diarrhea).   . ALPRAZolam (XANAX) 0.5 MG tablet Take 1/2 to 1 tablet by mouth three times daily as needed.  . cyanocobalamin (,VITAMIN B-12,)  1000 MCG/ML injection Inject 1,000 mcg into the muscle every 30 (thirty) days.  . Doxylamine Succinate, Sleep, (UNISOM) 25 MG tablet Take 25 mg by mouth at bedtime.   . DULoxetine (CYMBALTA) 60 MG capsule TAKE 1 CAPSULE EVERY DAY.  . hyoscyamine (LEVSIN SL) 0.125 MG SL tablet Place 1 tablet under the tongue Every 4 hours as needed for cramping.   . lidocaine (LIDODERM) 5 % Place 1 patch onto the skin daily.  . Multiple Vitamins-Minerals (MULTIVITAMIN WITH MINERALS) tablet Take 1 tablet by mouth daily.    . ondansetron (ZOFRAN) 4 MG tablet Take 4 mg by mouth every 4 (four) hours as needed for nausea.  Marland Kitchen oxyCODONE-acetaminophen (PERCOCET) 7.5-325 MG per tablet Take 1 tablet by mouth every 8 (eight) hours as needed for pain.  Marland Kitchen oxymorphone (OPANA ER) 30 MG 12 hr tablet Take 30 mg by mouth every 12 (twelve) hours.  . pantoprazole (PROTONIX) 40 MG tablet Take 1 tablet (40 mg total) by mouth daily. 30 minutes  before  the first meal of the day  . zoledronic acid (RECLAST) 5 MG/100ML SOLN Inject 5 mg into the vein once. Patient is due for one when she feels better  . atenolol (TENORMIN) 25 MG tablet Take 12.5-25 mg by mouth daily.  Marland Kitchen losartan (COZAAR) 50 MG tablet Take 1 tablet (50 mg total) by mouth daily.  . [  DISCONTINUED] azithromycin (ZITHROMAX) 250 MG tablet Take as directed  . [DISCONTINUED] chlorzoxazone (PARAFON) 500 MG tablet Take 1 tablet by mouth Three times daily as needed for muscle spasms.   . [DISCONTINUED] neomycin-polymyxin-hydrocortisone (CORTISPORIN) otic solution Use 2-3 drops in affected ear three times daily as needed    Allergies  Allergen Reactions  . Atorvastatin Nausea And Vomiting  . Levofloxacin     Reaction: achilles tendon pain  . Omnicef [Cefdinir] Diarrhea    Uncontrollable diarrhea  . Trazodone And Nefazodone     "Felt like I was going to faint"  . Sulfonamide Derivatives Rash    Current Medications, Allergies, Past Medical History, Past Surgical History,  Family History, and Social History were reviewed in [employer] record.    Review of Systems        See HPI - all other systems neg except as noted...  The patient complains of dyspnea on exertion, headaches, abdominal pain, and depression.  The patient denies anorexia, fever, weight loss, weight gain, vision loss, decreased hearing, hoarseness, chest pain, syncope, peripheral edema, prolonged cough, hemoptysis, melena, hematochezia, severe indigestion/heartburn, hematuria, incontinence, muscle weakness, suspicious skin lesions, transient blindness, difficulty walking, unusual weight change, abnormal bleeding, enlarged lymph nodes, and angioedema.     Objective:   Physical Exam     WD, WN, 62 y/o WF in NAD... GENERAL:  Alert & oriented; pleasant & cooperative... HEENT:  [place]/AT,  EACs-clear, TMs-wnl, NOSE-clear, THROAT-clear but dryMMs. NECK:  Supple w/ fairROM; no JVD; normal carotid impulses w/o bruits; no thyromegaly or nodules palpated; no lymphadenopathy. CHEST:  Clear to P & A; without wheezes/ rales/ or rhonchi heard... HEART:  Regular Rhythm; without murmurs/ rubs/ or gallops detected... ABDOMEN:  Soft &  normal bowel sounds; no organomegaly or masses palpated... EXT: without deformities, mild arthritic changes; no varicose veins/ +venous insuffic/ no edema.,  limp w/ walking.   NEURO:  no focal neuro deficits... DERM:  no lesions seen...    Assessment:

## 2013-11-16 NOTE — Patient Instructions (Signed)
Begin Zoloft 50mg daily .  Call back if you change your mind on referral to psychology .  Flu shot today.  Stress reducers.  Follow up Dr. Nadel  In 6weeks and As needed     

## 2013-11-16 NOTE — Addendum Note (Signed)
Addended by: Boone Master E on: 11/16/2013 05:09 PM   Modules accepted: Orders

## 2013-12-20 IMAGING — CT CT ABD-PELV W/ CM
2 of 5 series · 17 of 46 positions shown, 19 images · IV contrast (Omnipaque 300)
Comparison: 11/09/2011

CLINICAL DATA: Abdominal pain

CT ABDOMEN AND PELVIS WITH CONTRAST
TECHNIQUE: Multidetector CT imaging of the abdomen and pelvis was
performed following the standard protocol during bolus
administration of intravenous contrast.
Contrast:  80 ml of omni 300

[Series 2: abd/ pel 5mm · axial · 0.70mm/px · z∈[-500,-180]mm · 14 of 74 slices shown, 16 images]
[im 5/74  soft-tissue]
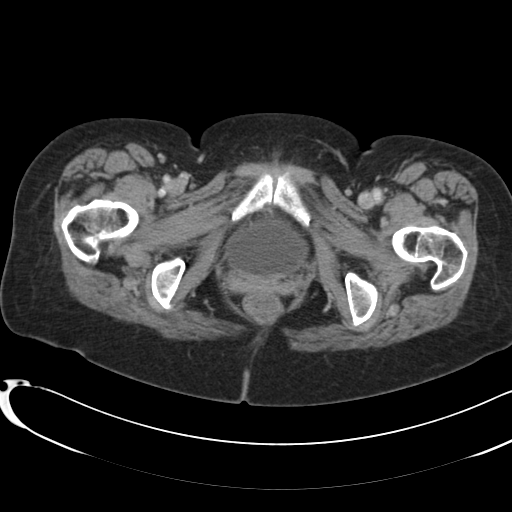
[im 5/74  bone]
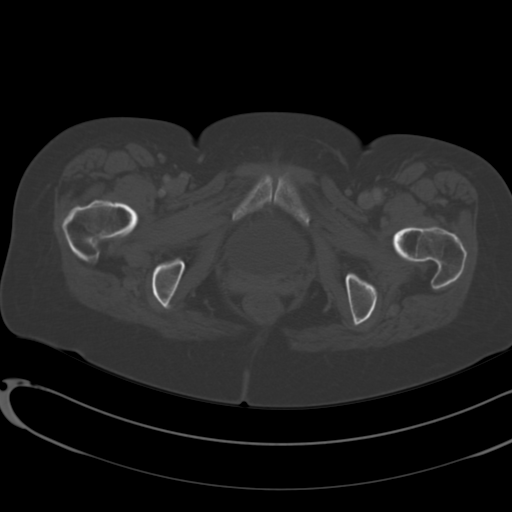
[im 9/74  soft-tissue]
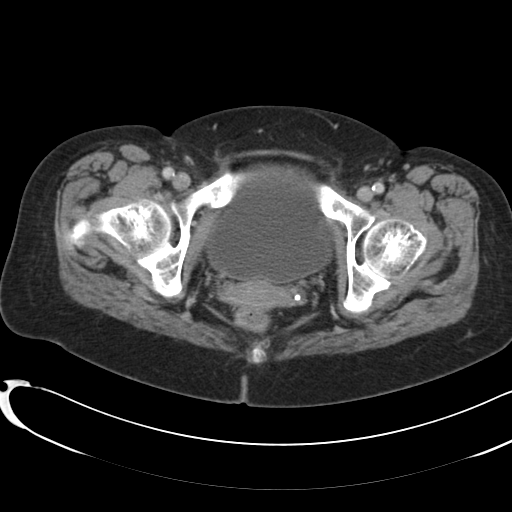
[im 17/74  soft-tissue]
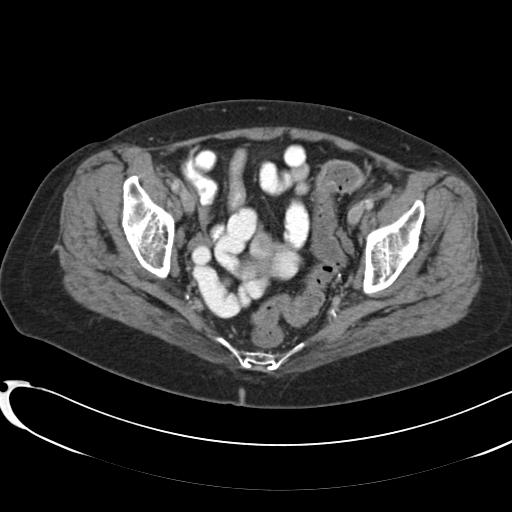
[im 21/74  soft-tissue]
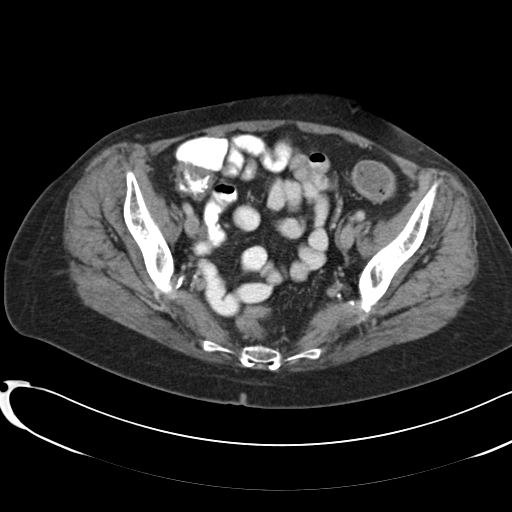
[im 25/74  soft-tissue]
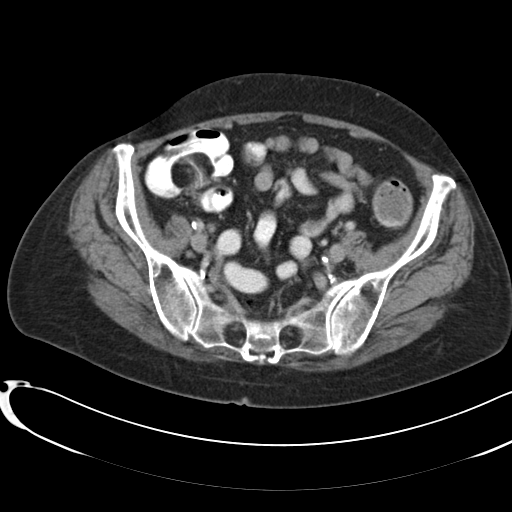
[im 29/74  soft-tissue]
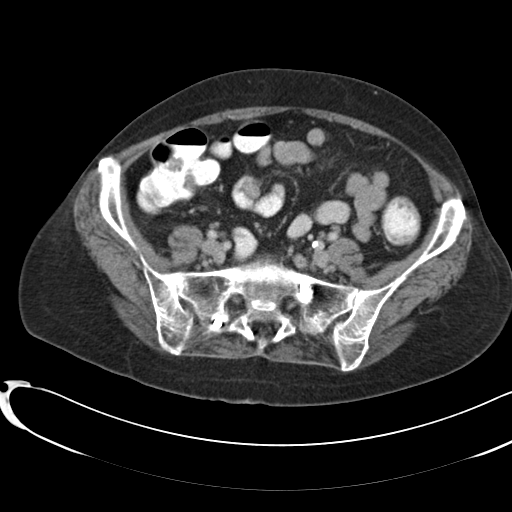
[im 33/74  soft-tissue]
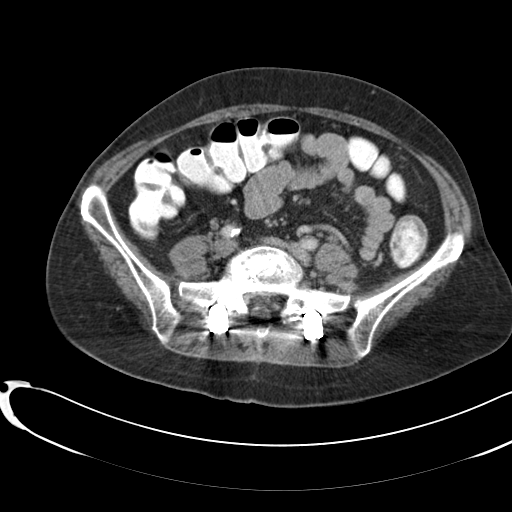
[im 41/74  soft-tissue]
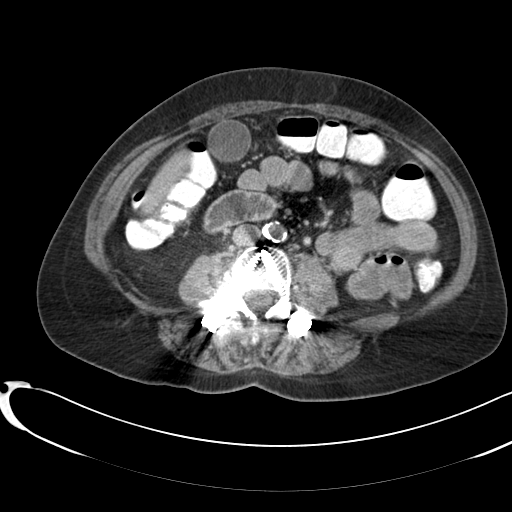
[im 45/74  soft-tissue]
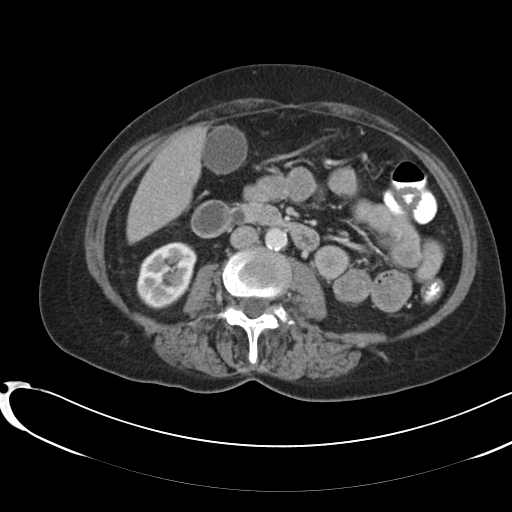
[im 45/74  bone]
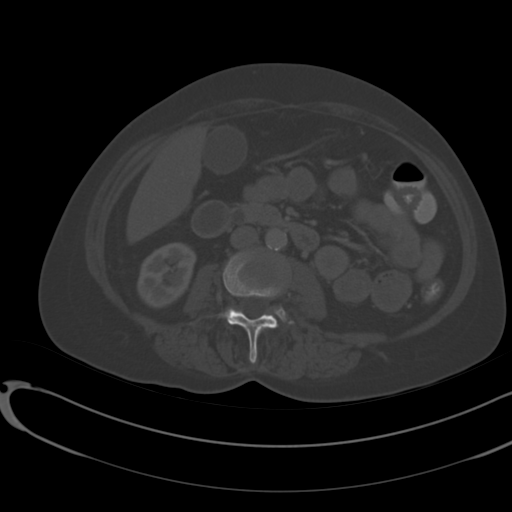
[im 49/74  soft-tissue]
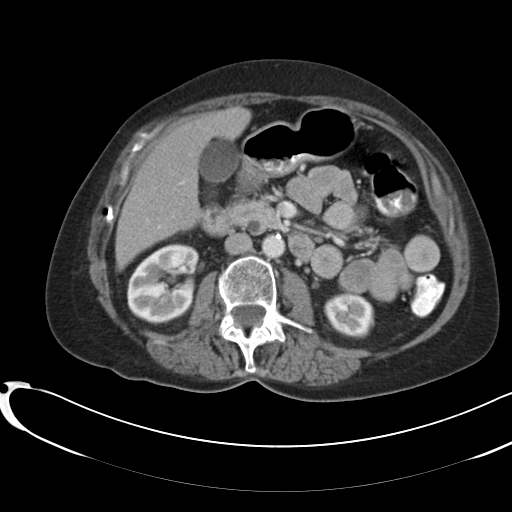
[im 53/74  soft-tissue]
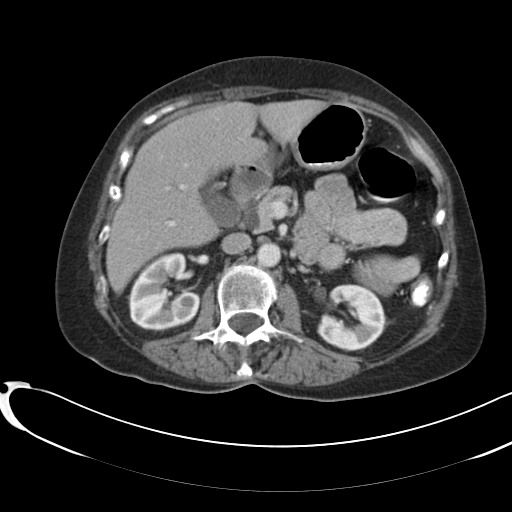
[im 57/74  soft-tissue]
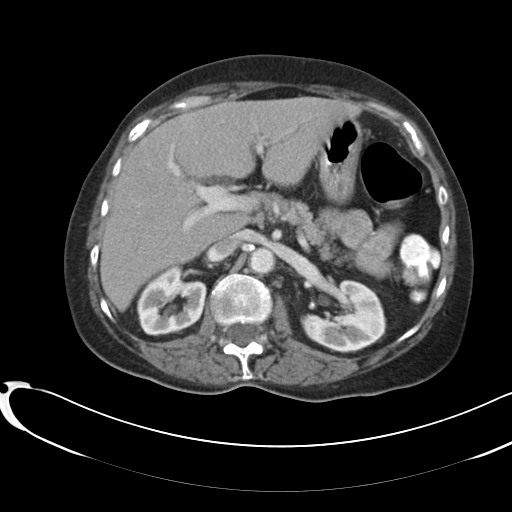
[im 65/74  soft-tissue]
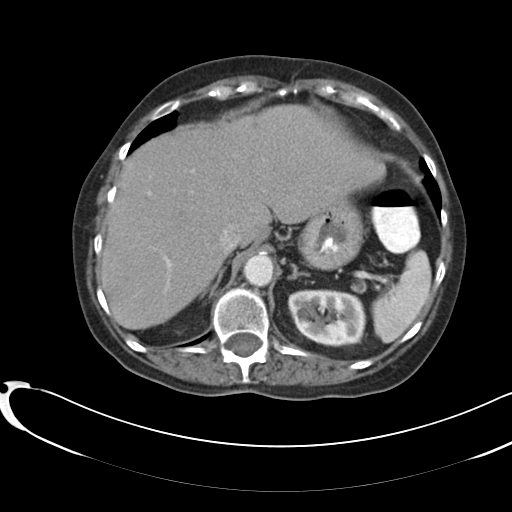
[im 69/74  soft-tissue]
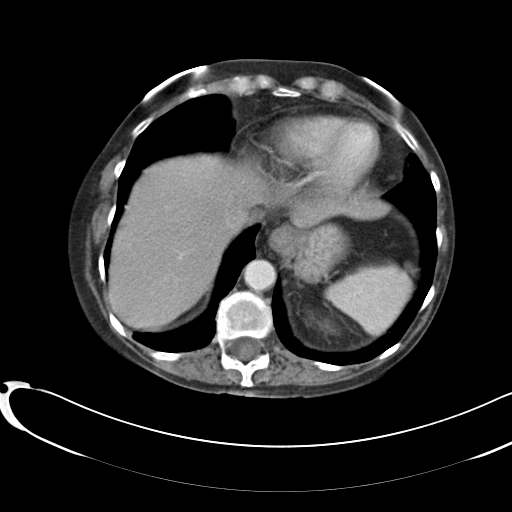

[Series 602: coronals · coronal · 0.75mm/px · 3 of 117 slices shown]
[im 39/117  soft-tissue]
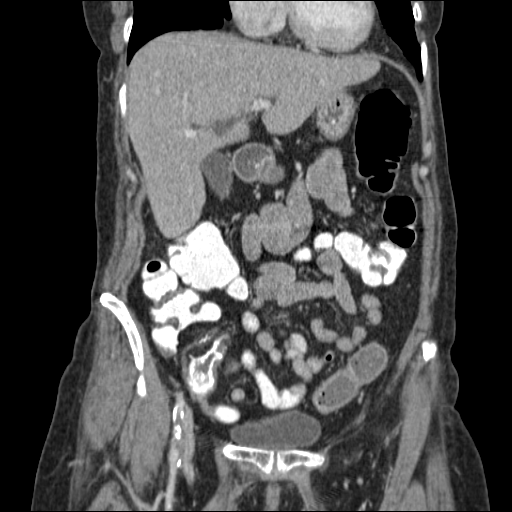
[im 52/117  soft-tissue]
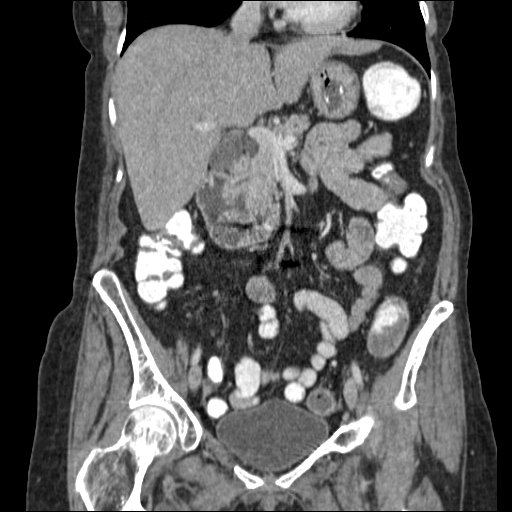
[im 65/117  soft-tissue]
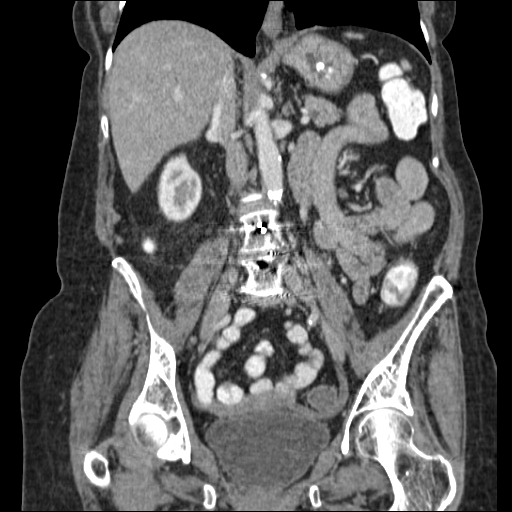

[17 of 46 positions shown; findings below may reference images not displayed]

FINDINGS: Scarring noted within the right base.  No pericardial or
pleural effusion.

6.3 mm hypodensity in the left hepatic lobe is unchanged from
previous exam.

The spleen appears within normal limits.

The adrenal glands are both normal.

Gallbladder has a normal appearance the common bile duct measures
up to 8.3 mm, image 57.  Previously 6.3 mm.

No focal pancreatic abnormalities.

Pancreatic duct has a normal caliber.  No masses seen within the
head of pancreas.

Normal appearance of both kidneys.  No enlarged upper abdominal
lymph nodes.

There is no pelvic or inguinal adenopathy.  The urinary bladder
appears normal.

The uterus and the adnexal structures are unremarkable.

The stomach and the small bowel loops are unremarkable.

The colon appears normal.

Uterus and adnexal structures are negative.

Review of the visualized bony structures is significant for diffuse
osteopenia.

Postop changes from L4-5 laminectomy and posterior fusion
identified.
IMPRESSION: 1.  There has been increase in caliber of the common bile duct
since the previous exam.  Currently 8.3 mm, previously 6.3 mm.  No
definite evidence for choledocholithiasis or obstructing mass
noted.
2. No acute findings noted.

## 2013-12-26 ENCOUNTER — Telehealth: Payer: Self-pay | Admitting: Pulmonary Disease

## 2013-12-26 MED ORDER — AZITHROMYCIN 250 MG PO TABS: 250.0000 mg | ORAL_TABLET | ORAL | Status: DC

## 2013-12-26 NOTE — Telephone Encounter (Signed)
Pt aware of rec's per SN. Nothing further needed. 

## 2013-12-26 NOTE — Telephone Encounter (Signed)
Spoke w/ pt. She reports Saturday and Sunday she had diarrhea and vomiting. HAs not had this since. Then x yesterday she has chest congestion, pred cough w/ clear phlem, fever of 100.0, wheezing, chest tx, chills, sweats. She is taking nyquil at bedtime and mucinex during the day. Please advise SN thanks  Allergies  Allergen Reactions  . Atorvastatin Nausea And Vomiting  . Levofloxacin     Reaction: achilles tendon pain  . Omnicef [Cefdinir] Diarrhea    Uncontrollable diarrhea  . Trazodone And Nefazodone     "Felt like I was going to faint"  . Sulfonamide Derivatives Rash

## 2013-12-26 NOTE — Telephone Encounter (Signed)
Per SN---  Rest  Fluids Tylenol zpak #1  Take as directed

## 2014-01-01 ENCOUNTER — Ambulatory Visit: Payer: 59 | Admitting: Adult Health

## 2014-01-03 ENCOUNTER — Ambulatory Visit (INDEPENDENT_AMBULATORY_CARE_PROVIDER_SITE_OTHER): Payer: 59 | Admitting: Adult Health

## 2014-01-03 ENCOUNTER — Ambulatory Visit (INDEPENDENT_AMBULATORY_CARE_PROVIDER_SITE_OTHER): Payer: 59

## 2014-01-03 ENCOUNTER — Encounter: Payer: Self-pay | Admitting: Adult Health

## 2014-01-03 VITALS — BP 106/74 | HR 90 | Temp 99.3°F | Ht 64.0 in | Wt 120.6 lb

## 2014-01-03 DIAGNOSIS — K589 Irritable bowel syndrome without diarrhea: Secondary | ICD-10-CM

## 2014-01-03 DIAGNOSIS — F3289 Other specified depressive episodes: Secondary | ICD-10-CM

## 2014-01-03 DIAGNOSIS — F329 Major depressive disorder, single episode, unspecified: Secondary | ICD-10-CM

## 2014-01-03 DIAGNOSIS — E538 Deficiency of other specified B group vitamins: Secondary | ICD-10-CM

## 2014-01-03 NOTE — Patient Instructions (Signed)
Increase Zoloft 100mg  daily .  Call back if you change your mind on referral to psychology .  Stress reducers.  Advance diet as tolerated.  Gas x with meals  Push fluids  Eat yogurt  Begin Probiotic daily  Follow up Dr. Lenna Gilford  In 3 months and As needed   Please contact office for sooner follow up if symptoms do not improve or worsen or seek emergency care

## 2014-01-04 MED ORDER — CYANOCOBALAMIN 1000 MCG/ML IJ SOLN
1000.0000 ug | Freq: Once | INTRAMUSCULAR | Status: AC
  Administered 2014-01-04: 1000 ug via INTRAMUSCULAR

## 2014-01-08 NOTE — Assessment & Plan Note (Signed)
Mild flare with recent gastroenteritis -resolving   Plan     Advance diet as tolerated.  Gas x with meals  Push fluids  Eat yogurt  Begin Probiotic daily  Follow up Dr. Lenna Gilford  In 3 months and As needed   Please contact office for sooner follow up if symptoms do not improve or worsen or seek emergency care

## 2014-01-08 NOTE — Progress Notes (Signed)
Subjective:     Patient ID: Denise King, female   DOB: 13-Nov-1951, 63 y.o.   MRN: [id number]  HPI 63 y/o WF    ~  August 06, 2009:  had back surg w/ fusion by DrCohen at [facility] in Oct09... much difficulty after surg- out of work til 4/10 (ret part time) and 8/10 (just ret full time)... she is seeing DrPhillips for pain management now on EMBEDA 50-2 Bid + MSIR for breakthrough pain...  ~  January 01, 2012:  247moROV & it's been a rough [facility]/ problems in several areas as reviewed below;  She also notes incr BP recently but she believes due to her incr pain level, and notes her pulse is always rapid> we discussed trial BBlocker for this w/ TOPROL XL 273md to start as she is scared of side effects of medications...    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    HBP> not currently on meds & she thinks hi readings at home are due to her pain; BP= 126/90 & pulse= 94 (readings higher at home); we decided on trial of low dose BBlocker ToprolXL 2550m as noted above...    GI> managed by DrKaplan & seen every 2-47mo82moe is c/o constant pain & diarrhea; now on Lotronex which seems to help the diarrhea but not her pain...    Chr Pain> Hx severe back problems- DrCohen, & pain managed by DrPhillips on Opana20Bid & Percocet10- now taking 5/d; "they can't go higher & pain is getting worse"...    Nerves/ Insomnia> DrKaplan started Xanax 1mgQ68m& she adds OTC Unisom25mgQ81mwe have filled her Rx for Cymbalta 60mgQh9m~  March 30, 2012:  47mo ROV35moere for recheck of her BP which is fair at 130/90 on Metoprolol25Bid, Lasix20/d, & K10/s; she notes that the BBlocker "adds another layer of tiredness" she says; we discussed adding LOSARTAN 50mg/d &21mking off the Metoprolol to 25mg/d (d42mding on her response we may be able to incr the Losar & wean the Metop off)...  Her CC today  however is abd pain & discomfort w/ nausea "mostly on left side but really all over abd"; she states this is different than her [name] symptoms; she reports going to [facility] (see [facility] 3/27 by DrDaub) & they felt she had prob diverticulitis & treated her w/ Cipro & Flagyl (CBC was normal w/ Hg=13.1, WBC=10.6 w/ 48% neutrophils, CMet-wnl, Amylase/Lipase-wnl, Urine-clear);  She reports NO BETTER after the antibiotics & reports bloating, constipated, w/ one sm BM daily ("I'm on the BRAT diet");  Past hx severe GERD w/ erosions on Protonix daily & last EGD 1/10 was neg;  Normal gastric emptying scan;  CTAbd 1/11 & 2/11 showed no acute dis just retained stool throughout colon & cyst in liver, kidney, & 8mm cystic70msion of the ventral pancreatic body (no change from 2006); symptoms felt to be secondary to narcotic analgesics she takes for her chronic back pain & chronic pain syndrome... Note: no divertics on CT Abd, but DrKaplan saw some on colonoscopy 1/12 (otherw unremarkable);  Last procedure was a CT Enterography 11/12 w/o acute process identified (the 8mm pseudoc51m in pancreas was seen);  She has PROTONIX, [place].  We discussed Rx w/ Anti-gas medications MYLICON & PHKenvilsure she's not constip w/ her narcotic analgesics- rec  to take Citrate of Magnesia + water, & Dulcolax if needed;  We will arrange for f/u w/ her gastroenterologist...    CT ABD 4/13 repeat==> neg for acute abn, but CBD is dil at 8.72m (prev6.331m- no stones or obstructing mass seen; GB appears normal; incidental findings include some scarring at right base, 27m64meft hep cyst, kidneys & pancreas appear normal, osteopenia & post op L4-5 laminectomy & fusion...    EGD 4/13 showed gastritis, bx= neg for HPylori...  ~  July 15, 2012:  33mo27mo & Connie persists w/ mult somatic complaints> dizzy, sleepy, elevHR w/ palpit, nauseated, 4+stress (63y/o daugh w/ severe migraines); we decided to add low dose Atenolol 25mg69m/2 to 1 tab for the HR & palpit...    [facility]> his eval is reviewed...    She then followed up w/ DrGessner for GI; [facility] Abd pain, diarrhea, gastritis on Protonix, [name] on Lotronex;  ?they stopped the Protonix because she was concerned about ospeopenia;     We reviewed prob list, meds, xrays and labs> see below>>  ~  August 15, 2013:  41yr R17yr CPX> [name] smoking, denies resp difficulties & her CC remains her chr pain issues, managed by DrPhillips pain clinic;  We reviewed the following medical problems during today's office visit >>     Smoker> she continues smoking ~1ppd & denies any recent URIs, bronchitis, etc; CXR today showed COPD, bilat nipple shadows...    HBP> supposed to be on Aten25Riverbankhe stopped all meds; BP= 118/74 & she is encouraged to monitor BP at home, ok off meds at this point.    GI> managed by DrKaplDemetra Shineris c/o constant pain & diarrhea; now on Lotronex which seems to help the diarrhea, Levsin SL, Zofran prn nausea;  Rec to add Protonix40/d & f/u w/ GI...    Chr Pain> Hx severe back problems- DrCohen, & pain managed by DrPhillips on Opana30Bid & Percocet7.5; also on Cymbalta60; c/o right shoulder & knee today- offered Ortho eval.    Osteoporosis> followed by GYN (DrHolland) on Reclast infusion yearly; [facility]...    Vit B12 Defic> on monthly B12 shots; Labs showed VitB12 level = 612...    Nerves/ Insomnia> on Xanax 0.5mgTid427mshe adds OTC Unisom25mgQhs67m have filled her Rx for Cymbalta 60mgQhs;327mer incr stress caring for ill 63 y/o mo45er. We reviewed prob list, meds, xrays and labs> see below for updates >>  CXR 8/14 showed norm heart size, COPD/emphysema, nipple shadows bilat, mild scoliosis of spine => repeat w/ nipple markers. LABS 8/14:  FLP- at goals on diet alone;  Chems- wnl;  CBC- ok x Hg=17.3;  TSH=0.92;  VitD=42;  VitB12=612;   UA=clear...   11/16/13 Acute OV  Complains of depression x5 weeks - doesn't feel like leaving home, no energy, decreased appetite, not sleeping well.  does admit to some suicidal thoughts.   constant pain wears on her.  Had recent fall at home-tripped  with T3-4 spinous process fx.  Seen by Ortho. Out of work.  Feeling sad a lot. Husband is supportive. Kids supportive.  Has loss of interest in activities Some anxiety , takes xanax on/off. Uses it for sleep issues as well .  Mother is NH.  No suicidal ideations.  Has chronic pain - followed at pain clinic  On Opana, Percocet and cymbalta .  Needs B12 shot today.  >rx Zoloft 32m daily   01/03/14 Follow up Depression  6 week follow up depression - reports feeling better on the Zoloft, "not as dark" as before. Last ov with increased depression symptoms. Under a lot of stress with chronic pain.  Discussed referral for counseling however she declines.  Does feel better on zoloft , does not feel as sad. No suicidial ideations.   Says she and her husband had a GI virus over the holidays with diarrhea , decreased appetite .  No fever, bloody stools or urinary symptoms Symptoms are getting better but not much appetite .             Problem List:  VERTIGO (ICD-780.4) - Hosp 8/08 w/ neg eval and eventually referred to Neurology w/ nothing found; Rx'd w/ Antivert, Phenergan, vestib therapy...  BRONCHITIS, RECURRENT (ICD-491.9) - 1ppd smoker w/ hx recurrent bronchitis... she refuses smoking cessation counseling, Chantix, etc... notes min cough, sputum & SOB- sedentary due to back prob... ~  CXR 2/12 showed normal heart size, COPD w/ sl incr markings, nipple shadows noted... 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 ~  CXR 8/14 showed norm heart size, COPD/emphysema, nipple shadows bilat, mild scoliosis of spine => repeat w/ nipple markers  She c/o heart rate too high >> we rec trial Aten 221m 1/2 to 1 tab  daily 7/13... ~  EKG 2/14 showed NSR, rate84, wnl, NAD...  VENOUS INSUFFICIENCY (ICD-459.81) - she knows to avoid sodium, elevate legs, wear support hose... has LASIX 2060mrn w/ KCl 76m77m REFLUX ESOPHAGITIS (ICD-530.11) >>  ~  EGD 9/05 by DrPerry showed mod severe reflux esoph w/ erosions,  Rx PROTONIX 40mg32megularly w/ good control, but states she needs it Bid;  She uses ZOFRAN 4mg a37meeded for nausea. ~  4/13:  She had EGD DrGessner w/ mod gastritis, Bx neg HPylori, Rx w/ Proptonix40...  DIVERTICULOSIS OF COLON (ICD-562.10), &  [name] (ICD-564.1) - on LEVSIN .125 SL Q4H prn> prev on Levbid & Librax... ~  extensive GI eval by DrKaplan 1/10 for abd pain, [name], etc... neg EGD, neg CTAbd, neg Emptying scan... believed secondary to narcotic analgesics... ~  on-going GI eval by DrKaplan w/ repeat CT Abd 2/11 (stable benign cysts liver/ pancreas/ renal), Colonoscopy 1/12 (sigm divertics & neg biopsies) etc... ~  Continued GI evaluation by DrKaplan in 2012 w/ ER visit 8/12 for [name] w/ N/ V; & Labs/ CT enterography, etc;  LOTRONEX helps her diarrhea + Probiotics. ~  4/13:  Recurrent severe abd pain, bloating, nausea, etc> SEE ABOVE & referred back to GI DrKaplan... ~  2/14:  DrHolland did diagnostic laparoscopy to try to finf reason for her LLQ abd pain> few adhesions, otherw neg; remarkably pt noted decr pain for 65mo af55mothe procedure (but upset that it cost her $1600)...  COLONIC POLYPS (ICD-211.3) >>  ~  prev colonoscopy 2/06 by DrKaplaDemetra Shiner Divertic, otherw neg; f/u 86yrs w/61yrm hx colon ca. ~  as above> f/u colon 1/12 w/ divertics otherw neg... ~  CT Abd 2/14 showed mod stool burden, smHH, 6mm hep 70mt on left, sm bilat renal cysts, prior appy, atherosclerotic calci Ao, spine surg....  CALCULUS, KIDNEY (ICD-592.0) - Hx of Kidney Stones & small renal cysts on CT scan & sonar - followed by DrWrenn.  BACK PAIN, LUMBAR (ICD-724.2) - [facility]  [facility]... she's  had series of epidural shots from DrStrong without relief... additional [facility] from [facility]- surg not recommended... referred to a chronic pain clinic... ~  fall w/ injury 2009 & further eval from [place]- she decided to proceed w/ spinal surgery & fusion by DrCohen 10/09 at [place]... much difficulty post op & now followed in the pain management clinic by DrPhillips. ~  she reports another opinion from DrBranch at [facility]- "he is considering additional surg"...  CHRONIC PAIN SYNDROME (ICD-338.4) - followed by drPhillips pain clinic... currently on OPANA ER 86m Bid & PERCOCET10- up to 5 daily... also on CYMBALTA 657md...  OSTEOPENIA (ICD-733.90) - she was prev on EVISTA, Ca++, Vits (w/ BMD per GYN)... she stopped the Evista due to nausea which she feels is better off this med... [facility] reports osteoporosis w/ RECLAST infusion... ~  labs 3/09 showed Vit d level = 12... started on Vit D 50000 u weekly. ~  labs 6/10 showed Vit D level = 49... OK to switch to 1000 u OTC daily. ~  [facility]> she reports Rx w/ RECLAST for osteoposis (we don't have records).  HEADACHE (ICD-784.0)  ANXIETY (ICD-300.00) - prev on Lexapro & Xanax, but these were stopped in favor of CYMBALTA 6065m; prev on Lunesta but DrKaplan switched her to XANAX 1mg9m & she added Unisom 25mg8m...  VITAMIN B12 DEFICIENCY (ICD-266.2) -  ~  labs 1010 showed Vit B12 level = 118 & started on B 12 shots monthly...  ~  labs 2/11 showed Vit B12 level = >1500... for some reason she stopped the B12 shots in 2011. ~  labs 2/12 showed Vit B12 level = 213... rec to resume B12 shots monthly. ~  Labs 8/12 by DrKapDemetra Shinered Vit B12 level = 517, Folate = 6.1 ~  Labs 3/13 showed B12 level = 361  HEALTH MAINTENANCE:  GYN= DrHolland & PAP, Mammogram, etc... all up-to-date; she is due a BMD in 1/10; and colon 10/11...   Past Surgical History  Procedure Laterality Date  .  Appendectomy    . Tubal ligation    . Back surgery  10-09    Dr. CohenPatrice Paradiseonsillectomy    . Laparoscopy N/A 02/21/2013    Procedure: LAPAROSCOPY OPERATIVE;  Surgeon: RichaMargarette Asal  [facility]  Service: Gynecology;  Laterality: N/A;  REQUESTING 5MM SCOPE WITH CAMERA    Outpatient Encounter Prescriptions as of 01/03/2014  Medication Sig  . alosetron (LOTRONEX) 1 MG tablet Take 1 mg by mouth daily as needed (for diarrhea).   . ALPRAZolam (XANAX) 0.5 MG tablet Take 1/2 to 1 tablet by mouth three times daily as needed.  . ateMarland Kitchenolol (TENORMIN) 25 MG tablet Take 12.5-25 mg by mouth daily.  . aziMarland Kitchenhromycin (ZITHROMAX) 250 MG tablet Take 1 tablet (250 mg total) by mouth as directed.  . cyanocobalamin (,VITAMIN B-12,) 1000 MCG/ML injection Inject 1,000 mcg into the muscle every 30 (thirty) days.  . Doxylamine Succinate, Sleep, (UNISOM) 25 MG tablet Take 25 mg by mouth at bedtime.   . DULoxetine (CYMBALTA) 60 MG capsule TAKE 1 CAPSULE EVERY DAY.  . hyoscyamine (LEVSIN SL) 0.125 MG SL tablet Place 1 tablet under the tongue Every 4 hours as needed for cramping.   . lidocaine (LIDODERM) 5 % Place 1 patch onto the skin daily.  . Multiple Vitamins-Minerals (MULTIVITAMIN WITH MINERALS) tablet Take 1 tablet by mouth daily.    . ondansetron (ZOFRAN) 4 MG tablet Take  4 mg by mouth every 4 (four) hours as needed for nausea.  Marland Kitchen oxyCODONE-acetaminophen (PERCOCET/ROXICET) 5-325 MG per tablet 1 every 4-6 hours as needed for pain  . oxymorphone (OPANA ER) 20 MG 12 hr tablet Take 20 mg by mouth every 12 (twelve) hours.  . pantoprazole (PROTONIX) 40 MG tablet Take 1 tablet (40 mg total) by mouth daily. 30 minutes  before  the first meal of the day  . sertraline (ZOLOFT) 50 MG tablet Take 1 tablet (50 mg total) by mouth daily.  . zoledronic acid (RECLAST) 5 MG/100ML SOLN Inject 5 mg into the vein once. Patient is due for one when she feels better  . losartan (COZAAR) 50 MG tablet Take 1 tablet (50 mg  total) by mouth daily.  . [DISCONTINUED] oxyCODONE-acetaminophen (PERCOCET) 7.5-325 MG per tablet Take 1 tablet by mouth every 8 (eight) hours as needed for pain.  . [DISCONTINUED] oxymorphone (OPANA ER) 30 MG 12 hr tablet Take 30 mg by mouth every 12 (twelve) hours.    Allergies  Allergen Reactions  . Atorvastatin Nausea And Vomiting  . Levofloxacin     Reaction: achilles tendon pain  . Omnicef [Cefdinir] Diarrhea    Uncontrollable diarrhea  . Trazodone And Nefazodone     "Felt like I was going to faint"  . Sulfonamide Derivatives Rash    Current Medications, Allergies, Past Medical History, Past Surgical History, Family History, and Social History were reviewed in [employer] record.    Review of Systems        See HPI - all other systems neg except as noted...    The patient denies anorexia, fever, weight loss, weight gain, vision loss, decreased hearing, hoarseness, chest pain, syncope, peripheral edema, prolonged cough, hemoptysis, melena, hematochezia, severe indigestion/heartburn, hematuria, incontinence, muscle weakness, suspicious skin lesions, transient blindness, difficulty walking, unusual weight change, abnormal bleeding, enlarged lymph nodes, and angioedema.     Objective:   Physical Exam     WD, WN, 63 y/o WF in NAD... GENERAL:  Alert & oriented; pleasant & cooperative... HEENT:  [place]/AT,  EACs-clear, TMs-wnl, NOSE-clear, THROAT-clear but dryMMs. NECK:  Supple w/ fairROM; no JVD; normal carotid impulses w/o bruits; no thyromegaly or nodules palpated; no lymphadenopathy. CHEST:  Clear to P & A; without wheezes/ rales/ or rhonchi heard... HEART:  Regular Rhythm; without murmurs/ rubs/ or gallops detected... ABDOMEN:  Soft &  normal bowel sounds; no organomegaly or masses palpated., no guarding or rebound EXT: without deformities, mild arthritic changes; no varicose veins/ +venous insuffic/ no edema.,  limp w/ walking.   NEURO:  no focal neuro  deficits... DERM:  no lesions seen...    Assessment:

## 2014-01-08 NOTE — Assessment & Plan Note (Signed)
Improving with zoloft , will increase dosage for better control .  rec referral for counseling -declined   Plan  ncrease Zoloft 100mg  daily .  Call back if you change your mind on referral to psychology .  Stress reducers.   Follow up Dr. Lenna Gilford  In 3 months and As needed   Please contact office for sooner follow up if symptoms do not improve or worsen or seek emergency care

## 2014-01-10 ENCOUNTER — Telehealth: Payer: Self-pay | Admitting: Adult Health

## 2014-01-10 MED ORDER — HYOSCYAMINE SULFATE 0.125 MG SL SUBL: 0.1250 mg | SUBLINGUAL_TABLET | SUBLINGUAL | Status: DC | PRN

## 2014-01-10 MED ORDER — SERTRALINE HCL 100 MG PO TABS: 100.0000 mg | ORAL_TABLET | Freq: Every day | ORAL | Status: DC

## 2014-01-10 NOTE — Telephone Encounter (Signed)
Pt calling requesting rx for the increased dose of Zoloft 100mg  and Levsin  Pt was last seen on 1.7.15 by TP and her Zoloft was increased at that time from 50mg  daily to 100mg  Rx sent to [facility] as requested Will sign off

## 2014-01-16 ENCOUNTER — Encounter: Payer: Self-pay | Admitting: Physician Assistant

## 2014-01-16 ENCOUNTER — Other Ambulatory Visit (INDEPENDENT_AMBULATORY_CARE_PROVIDER_SITE_OTHER): Payer: 59

## 2014-01-16 ENCOUNTER — Ambulatory Visit (INDEPENDENT_AMBULATORY_CARE_PROVIDER_SITE_OTHER): Payer: 59 | Admitting: Physician Assistant

## 2014-01-16 VITALS — BP 140/90 | HR 64 | Ht 62.6 in | Wt 120.0 lb

## 2014-01-16 DIAGNOSIS — R197 Diarrhea, unspecified: Secondary | ICD-10-CM

## 2014-01-16 LAB — BASIC METABOLIC PANEL
BUN: 7 mg/dL (ref 6–23)
CO2: 32 mEq/L (ref 19–32)
Calcium: 8.7 mg/dL (ref 8.4–10.5)
Chloride: 97 mEq/L (ref 96–112)
Creatinine, Ser: 0.8 mg/dL (ref 0.4–1.2)
GFR: 78.73 mL/min (ref >60.00)
Glucose, Bld: 92 mg/dL (ref 70–99)
Potassium: 3.3 mEq/L — ABNORMAL LOW (ref 3.5–5.1)
Sodium: 138 mEq/L (ref 135–145)

## 2014-01-16 LAB — CBC WITH DIFFERENTIAL/PLATELET
Basophils Absolute: 0 10*3/uL (ref 0.0–0.1)
Basophils Relative: 0.5 % (ref 0.0–3.0)
Eosinophils Absolute: 0 10*3/uL (ref 0.0–0.7)
Eosinophils Relative: 0.3 % (ref 0.0–5.0)
HCT: 41.5 % (ref 36.0–46.0)
Hemoglobin: 14.1 g/dL (ref 12.0–15.0)
Lymphocytes Relative: 44.6 % (ref 12.0–46.0)
Lymphs Abs: 3.8 10*3/uL (ref 0.7–4.0)
MCHC: 34 g/dL (ref 30.0–36.0)
MCV: 101.3 fl — ABNORMAL HIGH (ref 78.0–100.0)
Monocytes Absolute: 0.7 10*3/uL (ref 0.1–1.0)
Monocytes Relative: 7.8 % (ref 3.0–12.0)
Neutro Abs: 4 10*3/uL (ref 1.4–7.7)
Neutrophils Relative %: 46.8 % (ref 43.0–77.0)
Platelets: 327 10*3/uL (ref 150.0–400.0)
RBC: 4.1 Mil/uL (ref 3.87–5.11)
RDW: 14.1 % (ref 11.5–14.6)
WBC: 8.6 10*3/uL (ref 4.5–10.5)

## 2014-01-16 MED ORDER — SACCHAROMYCES BOULARDII 250 MG PO CAPS: 250.0000 mg | ORAL_CAPSULE | Freq: Two times a day (BID) | ORAL | Status: DC

## 2014-01-16 MED ORDER — METRONIDAZOLE 250 MG PO TABS: 250.0000 mg | ORAL_TABLET | Freq: Four times a day (QID) | ORAL | Status: DC

## 2014-01-16 NOTE — Patient Instructions (Addendum)
Please go to the basement level to have your labs drawn.  We sent prescriptions to [facility]. You can take Imodium, several times a day for now.  1. Flagyl 2. Florastor 3. We have provided a note for work.

## 2014-01-16 NOTE — Progress Notes (Signed)
Subjective:    Patient ID: Denise King, female    DOB: 1951/03/06, 63 y.o.   MRN: [id number]  HPI  Denise King is a pleasant 63 year old white female known to Dr. Deatra Ina. She has history of [name] diarrhea predominant. Medical history also pertinent for a chronic pain syndrome and narcotic dependent, hypertension , and depression. She has chronic back pain with thoracic disease. Patient had previously been on Lotronex for her chronic diarrhea and said this had worked well. She was able to taper off of it and says that she had had no problems for many months. Last colonoscopy was done in January 2012 for diarrhea she had moderate diverticulosis and random biopsies were done to rule out microscopic colitis, these were unremarkable. She says her current symptoms started the last week of December and at that she has had diarrhea every day since. She is having 6-8 loose to liquid bowel movements per day with cramping and urgency. She is also having some nocturnal diarrhea. Stools are malodorous. Her appetite has been fair but she is eating  less because this exacerbates the diarrhea. Her weight is down about 7 pounds. She thinks she had some low-grade fever the first week of January but none since. No melena or hematochezia . She had been started on Zoloft within the past couple of months and had her dosage increased. She also took a course of Zithromax the last week of December and says that her symptoms definitely worsened after that.    Review of Systems  Constitutional: Positive for appetite change, fatigue and unexpected weight change.  HENT: Negative.   Eyes: Negative.   Respiratory: Negative.   Cardiovascular: Negative.   Gastrointestinal: Positive for abdominal pain and diarrhea.  Endocrine: Negative.   Genitourinary: Negative.   Musculoskeletal: Positive for back pain.  Allergic/Immunologic: Negative.   Neurological: Negative.   Hematological: Negative.   Psychiatric/Behavioral:  Negative.    Outpatient Prescriptions Prior to Visit  Medication Sig Dispense Refill  . alosetron (LOTRONEX) 1 MG tablet Take 1 mg by mouth daily as needed (for diarrhea).       . ALPRAZolam (XANAX) 0.5 MG tablet Take 1/2 to 1 tablet by mouth three times daily as needed.      Marland Kitchen atenolol (TENORMIN) 25 MG tablet Take 12.5-25 mg by mouth daily.      Marland Kitchen azithromycin (ZITHROMAX) 250 MG tablet Take 1 tablet (250 mg total) by mouth as directed.  6 tablet  0  . cyanocobalamin (,VITAMIN B-12,) 1000 MCG/ML injection Inject 1,000 mcg into the muscle every 30 (thirty) days.      . Doxylamine Succinate, Sleep, (UNISOM) 25 MG tablet Take 25 mg by mouth at bedtime.       . DULoxetine (CYMBALTA) 60 MG capsule TAKE 1 CAPSULE EVERY DAY.  30 capsule  6  . hyoscyamine (LEVSIN SL) 0.125 MG SL tablet Place 1 tablet (0.125 mg total) under the tongue every 4 (four) hours as needed for cramping.  60 tablet  3  . lidocaine (LIDODERM) 5 % Place 1 patch onto the skin daily.      . Multiple Vitamins-Minerals (MULTIVITAMIN WITH MINERALS) tablet Take 1 tablet by mouth daily.        . ondansetron (ZOFRAN) 4 MG tablet Take 4 mg by mouth every 4 (four) hours as needed for nausea.      Marland Kitchen oxyCODONE-acetaminophen (PERCOCET/ROXICET) 5-325 MG per tablet 1 every 4-6 hours as needed for pain      . oxymorphone (OPANA ER)  20 MG 12 hr tablet Take 20 mg by mouth every 12 (twelve) hours.      . pantoprazole (PROTONIX) 40 MG tablet Take 1 tablet (40 mg total) by mouth daily. 30 minutes  before  the first meal of the day  30 tablet  11  . sertraline (ZOLOFT) 100 MG tablet Take 1 tablet (100 mg total) by mouth daily.  30 tablet  5  . zoledronic acid (RECLAST) 5 MG/100ML SOLN Inject 5 mg into the vein once. Patient is due for one when she feels better      . losartan (COZAAR) 50 MG tablet Take 1 tablet (50 mg total) by mouth daily.  30 tablet  11   No facility-administered medications prior to visit.   Allergies  Allergen Reactions  .  Atorvastatin Nausea And Vomiting  . Levofloxacin     Reaction: achilles tendon pain  . Omnicef [Cefdinir] Diarrhea    Uncontrollable diarrhea  . Trazodone And Nefazodone     "Felt like I was going to faint"  . Sulfonamide Derivatives Rash   Patient Active Problem List   Diagnosis Date Noted  . Cigarette smoker 08/15/2013  . Generalized anxiety disorder 08/15/2013  . Otitis media 10/27/2012  . Nausea alone 04/05/2012  . Upper abdominal pain 04/05/2012  . Nonspecific (abnormal) findings on radiological and other examination of biliary tract 04/04/2012  . Hypertension 03/31/2012  . Abdominal pain, generalized 03/30/2012  . HEPATIC CYST 02/18/2010  . CHRONIC PAIN SYNDROME 02/08/2010  . DEPRESSION 01/27/2010  . VITAMIN B12 DEFICIENCY 10/04/2009  . GERD 01/04/2009  . ABDOMINAL PAIN-EPIGASTRIC 01/04/2009  . DYSTHYMIA 08/10/2008  . VENOUS INSUFFICIENCY 08/10/2008  . Irritable bowel syndrome 08/10/2008  . BACK PAIN 08/10/2008  . CIGARETTE SMOKER 03/06/2008  . COLONIC POLYPS 12/06/2007  . BRONCHITIS, RECURRENT 12/06/2007  . REFLUX ESOPHAGITIS 12/06/2007  . DIVERTICULOSIS OF COLON 12/06/2007  . OSTEOPENIA 12/06/2007  . CALCULUS, KIDNEY 10/07/2007  . VERTIGO 10/07/2007  . HEADACHE 10/07/2007   History  Substance Use Topics  . Smoking status: Current Every Day Smoker -- 1.00 packs/day for 30 years    Types: Cigarettes  . Smokeless tobacco: Never Used  . Alcohol Use: Yes     Comment: occasional   family history includes Colon cancer in her brother and father; Heart disease in her mother; Prostate cancer in her father; Skin cancer in her sister. There is no history of Esophageal cancer, Rectal cancer, or Stomach cancer.     Objective:   Physical Exam  well-developed white female in no acute distress, blood pressure 140/90 pulse 64 height 5 foot 2 weight 120. HEENT; nontraumatic normocephalic EOMI PERRLA sclera anicteric, Supple; no JVD, Cardiovascular; regular rate and rhythm with  S1-S2 no murmur or gallop, Pulmonary; clear bilaterally, Abdomen; soft bowel sounds are hyperactive she is mild tenderness in the left lower abdomen no guarding or rebound no mass or hepatosplenomegaly, Rectal; exam not done, Extremities; no clubbing cyanosis or edema skin warm dry, Psych ;mood and affect appropriate        Assessment & Plan:  #44 63 year old female with [name]/diarrhea predominant. Symptoms have been quite hasn't for several months and patient was able to come off of Lotronex now with 4-5 week history of persistent diarrhea and decrease in appetite. Rule out infectious etiology i.e. C. differential versus exacerbation of [name]. Of also reviewed her records and do not see that she's been tested for celiac disease which should also be ruled out. #2 positive family history of colon cancer  in a sibling-last colonoscopy January 2012 will need fiber followup in 2017 #3 diverticulosis #4 chronic pain syndrome and narcotic dependent #5 depression-patient also recently started Zoloft and had a dosage increase-diarrhea can be a potential side effect  Plan; CBC with differential, BM ET, and stool pathogen panel. We'll also obtain a celiac panel. Hold Lotronex for now-he had taken for 4 days recently without any benefit Flagyl 250 mg by mouth 4 times daily x14 days Florastor one by mouth twice daily x3 weeks Low residue diet Note for work to be out of remainder of the week Further plans pending results of above

## 2014-01-17 ENCOUNTER — Other Ambulatory Visit: Payer: Self-pay | Admitting: *Deleted

## 2014-01-17 ENCOUNTER — Other Ambulatory Visit: Payer: 59

## 2014-01-17 DIAGNOSIS — R197 Diarrhea, unspecified: Secondary | ICD-10-CM

## 2014-01-17 LAB — CELIAC PANEL 10
Endomysial Screen: NEGATIVE
Gliadin IgA: 3.7 U/mL (ref <20)
Gliadin IgG: 32.1 U/mL — ABNORMAL HIGH (ref <20)
IgA: 343 mg/dL (ref 69–380)
Tissue Transglut Ab: 11.7 U/mL (ref <20)
Tissue Transglutaminase Ab, IgA: 4.4 U/mL (ref <20)

## 2014-01-17 MED ORDER — POTASSIUM CHLORIDE CRYS ER 20 MEQ PO TBCR: EXTENDED_RELEASE_TABLET | ORAL | Status: DC

## 2014-01-17 NOTE — Progress Notes (Signed)
Reviewed and agree with management. Robert D. Kaplan, M.D., FACG  

## 2014-01-18 LAB — GASTROINTESTINAL PATHOGEN PANEL PCR
C. difficile Tox A/B, PCR: NEGATIVE
Campylobacter, PCR: NEGATIVE
Cryptosporidium, PCR: NEGATIVE
E coli (ETEC) LT/ST PCR: NEGATIVE
E coli (STEC) stx1/stx2, PCR: NEGATIVE
E coli 0157, PCR: NEGATIVE
Giardia lamblia, PCR: NEGATIVE
Norovirus, PCR: NEGATIVE
Rotavirus A, PCR: NEGATIVE
Salmonella, PCR: NEGATIVE
Shigella, PCR: NEGATIVE

## 2014-02-09 ENCOUNTER — Ambulatory Visit (AMBULATORY_SURGERY_CENTER): Payer: Self-pay

## 2014-02-09 ENCOUNTER — Ambulatory Visit (INDEPENDENT_AMBULATORY_CARE_PROVIDER_SITE_OTHER): Payer: 59

## 2014-02-09 VITALS — Ht 64.0 in | Wt 116.0 lb

## 2014-02-09 DIAGNOSIS — R197 Diarrhea, unspecified: Secondary | ICD-10-CM

## 2014-02-09 DIAGNOSIS — E538 Deficiency of other specified B group vitamins: Secondary | ICD-10-CM

## 2014-02-13 ENCOUNTER — Encounter: Payer: Self-pay | Admitting: Internal Medicine

## 2014-02-14 DIAGNOSIS — M48061 Spinal stenosis, lumbar region without neurogenic claudication: Secondary | ICD-10-CM | POA: Insufficient documentation

## 2014-02-15 MED ORDER — CYANOCOBALAMIN 1000 MCG/ML IJ SOLN
1000.0000 ug | Freq: Once | INTRAMUSCULAR | Status: AC
  Administered 2014-02-15: 1000 ug via INTRAMUSCULAR

## 2014-02-20 ENCOUNTER — Ambulatory Visit (AMBULATORY_SURGERY_CENTER): Payer: 59 | Admitting: Internal Medicine

## 2014-02-20 ENCOUNTER — Encounter: Payer: Self-pay | Admitting: Internal Medicine

## 2014-02-20 VITALS — BP 129/72 | HR 92 | Temp 98.8°F | Resp 17 | Ht 64.0 in | Wt 116.0 lb

## 2014-02-20 DIAGNOSIS — K317 Polyp of stomach and duodenum: Secondary | ICD-10-CM

## 2014-02-20 DIAGNOSIS — K2970 Gastritis, unspecified, without bleeding: Secondary | ICD-10-CM

## 2014-02-20 DIAGNOSIS — K2990 Gastroduodenitis, unspecified, without bleeding: Secondary | ICD-10-CM

## 2014-02-20 DIAGNOSIS — D131 Benign neoplasm of stomach: Secondary | ICD-10-CM

## 2014-02-20 DIAGNOSIS — D1330 Benign neoplasm of unspecified part of small intestine: Secondary | ICD-10-CM

## 2014-02-20 DIAGNOSIS — R197 Diarrhea, unspecified: Secondary | ICD-10-CM

## 2014-02-20 MED ORDER — SODIUM CHLORIDE 0.9 % IV SOLN: 500.0000 mL | INTRAVENOUS | Status: DC

## 2014-02-20 NOTE — Op Note (Addendum)
[facility]  [address] [place], [address]   ENDOSCOPY PROCEDURE REPORT  PATIENT: Denise King, Denise King  MR#: [id number] BIRTHDATE: 1951/09/16 , 62  yrs. old GENDER: Female ENDOSCOPIST: Gatha Mayer, MD, [facility] PROCEDURE DATE:  02/20/2014 PROCEDURE:  EGD w/ biopsy ASA CLASS:     Class II INDICATIONS:  Diagnostic procedure.  diarrhea, + anti-gliadin ab ? celiac dz MEDICATIONS: propofol (Diprivan) 250mg  IV, MAC sedation, administered by CRNA, and These medications were titrated to patient response per physician's verbal order TOPICAL ANESTHETIC: none  DESCRIPTION OF PROCEDURE: After the risks benefits and alternatives of the procedure were thoroughly explained, informed consent was obtained.  The LB [id number] endoscope was introduced through the mouth and advanced to the second portion of the duodenum. Without limitations.  The instrument was slowly withdrawn as the mucosa was fully examined.        STOMACH: Moderate gastritis (inflammation) was found in the gastric antrum.  Multiple biopsies were performed using cold forceps. Sample sent for histology.   A diminutive gastric polyp was seen and removed with biopsy forceps in the antrum also.  The remainder of the upper endoscopy exam was otherwise normal. Duodenal biopsies taken to look for celiac disease. Retroflexed views revealed no abnormalities.     The scope was then withdrawn from the patient and the procedure completed.  COMPLICATIONS: There were no complications. ENDOSCOPIC IMPRESSION: 1.   Gastritis (inflammation) was found in the gastric antrum; multiple biopsies 2.   Diminutive gastric polyp removed from antrum with forceps 3.   The remainder of the upper endoscopy exam was otherwise normal - duodenal biopsies taken  RECOMMENDATIONS: 1.  Office will call with results 2.   If biopsies unhelpful suspect restarting Lotronex is next step.  eSigned:  Gatha Mayer, MD, [facility]  02/20/2014 3:19 PM Revised: 02/20/2014 3:19 PM CC:The Patient   (patient did not receive copy with gastric polyp noted - I will explain)

## 2014-02-20 NOTE — Patient Instructions (Addendum)
The stomach looked slightly irritated - "gastritis". I took biopsies and also took biopsies of the duodenum to look for celiac disease.  Our office will call you with the results and plans by next week.  I appreciate the opportunity to care for you. Gatha Mayer, MD, FACG  YOU HAD AN ENDOSCOPIC PROCEDURE TODAY AT [facility]: Refer to the procedure report that was given to you for any specific questions about what was found during the examination.  If the procedure report does not answer your questions, please call your gastroenterologist to clarify.  If you requested that your care partner not be given the details of your procedure findings, then the procedure report has been included in a sealed envelope for you to review at your convenience later.  YOU SHOULD EXPECT: Some feelings of bloating in the abdomen. Passage of more gas than usual.  Walking can help get rid of the air that was put into your GI tract during the procedure and reduce the bloating. If you had a lower endoscopy (such as a colonoscopy or flexible sigmoidoscopy) you may notice spotting of blood in your stool or on the toilet paper. If you underwent a bowel prep for your procedure, then you may not have a normal bowel movement for a few days.  DIET: Your first meal following the procedure should be a light meal and then it is ok to progress to your normal diet.  A half-sandwich or bowl of soup is an example of a good first meal.  Heavy or fried foods are harder to digest and may make you feel nauseous or bloated.  Likewise meals heavy in dairy and vegetables can cause extra gas to form and this can also increase the bloating.  Drink plenty of fluids but you should avoid alcoholic beverages for 24 hours.  ACTIVITY: Your care partner should take you home directly after the procedure.  You should plan to take it easy, moving slowly for the rest of the day.  You can resume normal activity the day after the procedure  however you should NOT DRIVE or use heavy machinery for 24 hours (because of the sedation medicines used during the test).    SYMPTOMS TO REPORT IMMEDIATELY: A gastroenterologist can be reached at any hour.  During normal business hours, 8:30 AM to 5:00 PM Monday through Friday, call [phone].  After hours and on weekends, please call the GI answering service at [phone] who will take a message and have the physician on call contact you.   Following upper endoscopy (EGD)  Vomiting of blood or coffee ground material  New chest pain or pain under the shoulder blades  Painful or persistently difficult swallowing  New shortness of breath  Fever of 100F or higher  Black, tarry-looking stools  FOLLOW UP: If any biopsies were taken you will be contacted by phone or by letter within the next 1-3 weeks.  Call your gastroenterologist if you have not heard about the biopsies in 3 weeks.  Our staff will call the home number listed on your records the next business day following your procedure to check on you and address any questions or concerns that you may have at that time regarding the information given to you following your procedure. This is a courtesy call and so if there is no answer at the home number and we have not heard from you through the emergency physician on call, we will assume that you have returned to  your regular daily activities without incident.  SIGNATURES/CONFIDENTIALITY: You and/or your care partner have signed paperwork which will be entered into your electronic medical record.  These signatures attest to the fact that that the information above on your After Visit Summary has been reviewed and is understood.  Full responsibility of the confidentiality of this discharge information lies with you and/or your care-partner.  Recommendations Office will call with results If biopsies unhelpful suspect restarting Lotronex is next step.

## 2014-02-20 NOTE — Progress Notes (Signed)
Report to pacu rn, vss, bbs=clear 

## 2014-02-20 NOTE — Progress Notes (Signed)
Called to room to assist during endoscopic procedure.  Patient ID and intended procedure confirmed with present staff. Received instructions for my participation in the procedure from the performing physician.  

## 2014-02-21 ENCOUNTER — Telehealth: Payer: Self-pay | Admitting: *Deleted

## 2014-02-21 NOTE — Telephone Encounter (Signed)
  Follow up Call-  Call back number 02/20/2014 04/07/2012  Post procedure Call Back phone  # [phone] hm  Permission to leave phone message Yes Yes     Patient questions:  Do you have a fever, pain , or abdominal swelling? no Pain Score  0 *  Have you tolerated food without any problems? yes  Have you been able to return to your normal activities? yes  Do you have any questions about your discharge instructions: Diet   no Medications  no Follow up visit  no  Do you have questions or concerns about your Care? no  Actions: * If pain score is 4 or above: No action needed, pain <4.

## 2014-02-28 ENCOUNTER — Other Ambulatory Visit: Payer: Self-pay

## 2014-02-28 MED ORDER — SUCRALFATE 1 G PO TABS: 1.0000 g | ORAL_TABLET | Freq: Four times a day (QID) | ORAL | Status: DC

## 2014-03-01 ENCOUNTER — Other Ambulatory Visit: Payer: Self-pay | Admitting: Pulmonary Disease

## 2014-03-01 MED ORDER — ALPRAZOLAM 0.5 MG PO TABS: ORAL_TABLET | ORAL | Status: DC

## 2014-03-06 ENCOUNTER — Ambulatory Visit: Payer: 59 | Admitting: Gastroenterology

## 2014-03-12 ENCOUNTER — Ambulatory Visit (INDEPENDENT_AMBULATORY_CARE_PROVIDER_SITE_OTHER): Payer: 59

## 2014-03-12 DIAGNOSIS — E538 Deficiency of other specified B group vitamins: Secondary | ICD-10-CM

## 2014-03-13 MED ORDER — CYANOCOBALAMIN 1000 MCG/ML IJ SOLN
1000.0000 ug | Freq: Once | INTRAMUSCULAR | Status: AC
  Administered 2014-03-13: 1000 ug via INTRAMUSCULAR

## 2014-04-03 ENCOUNTER — Ambulatory Visit (INDEPENDENT_AMBULATORY_CARE_PROVIDER_SITE_OTHER): Payer: 59 | Admitting: Pulmonary Disease

## 2014-04-03 ENCOUNTER — Encounter: Payer: Self-pay | Admitting: Pulmonary Disease

## 2014-04-03 ENCOUNTER — Ambulatory Visit (INDEPENDENT_AMBULATORY_CARE_PROVIDER_SITE_OTHER): Payer: 59

## 2014-04-03 VITALS — BP 120/84 | HR 102 | Temp 98.0°F | Ht 64.0 in | Wt 122.6 lb

## 2014-04-03 DIAGNOSIS — K5730 Diverticulosis of large intestine without perforation or abscess without bleeding: Secondary | ICD-10-CM

## 2014-04-03 DIAGNOSIS — E538 Deficiency of other specified B group vitamins: Secondary | ICD-10-CM

## 2014-04-03 DIAGNOSIS — F17200 Nicotine dependence, unspecified, uncomplicated: Secondary | ICD-10-CM

## 2014-04-03 DIAGNOSIS — G894 Chronic pain syndrome: Secondary | ICD-10-CM

## 2014-04-03 DIAGNOSIS — F341 Dysthymic disorder: Secondary | ICD-10-CM

## 2014-04-03 DIAGNOSIS — D126 Benign neoplasm of colon, unspecified: Secondary | ICD-10-CM

## 2014-04-03 DIAGNOSIS — F329 Major depressive disorder, single episode, unspecified: Secondary | ICD-10-CM

## 2014-04-03 DIAGNOSIS — K219 Gastro-esophageal reflux disease without esophagitis: Secondary | ICD-10-CM

## 2014-04-03 DIAGNOSIS — F3289 Other specified depressive episodes: Secondary | ICD-10-CM

## 2014-04-03 DIAGNOSIS — K589 Irritable bowel syndrome without diarrhea: Secondary | ICD-10-CM

## 2014-04-03 DIAGNOSIS — N200 Calculus of kidney: Secondary | ICD-10-CM

## 2014-04-03 DIAGNOSIS — J42 Unspecified chronic bronchitis: Secondary | ICD-10-CM

## 2014-04-03 DIAGNOSIS — I872 Venous insufficiency (chronic) (peripheral): Secondary | ICD-10-CM

## 2014-04-03 DIAGNOSIS — M949 Disorder of cartilage, unspecified: Secondary | ICD-10-CM

## 2014-04-03 DIAGNOSIS — M549 Dorsalgia, unspecified: Secondary | ICD-10-CM

## 2014-04-03 DIAGNOSIS — M899 Disorder of bone, unspecified: Secondary | ICD-10-CM

## 2014-04-03 MED ORDER — PANTOPRAZOLE SODIUM 40 MG PO TBEC: 40.0000 mg | DELAYED_RELEASE_TABLET | Freq: Every day | ORAL | Status: DC

## 2014-04-03 MED ORDER — CYANOCOBALAMIN 1000 MCG/ML IJ SOLN
1000.0000 ug | Freq: Once | INTRAMUSCULAR | Status: AC
  Administered 2014-04-03: 1000 ug via INTRAMUSCULAR

## 2014-04-03 MED ORDER — DULOXETINE HCL 60 MG PO CPEP: ORAL_CAPSULE | ORAL | Status: DC

## 2014-04-03 NOTE — Progress Notes (Signed)
Subjective:     Patient ID: Denise King, female   DOB: May 14, 1951, 63 y.o.   MRN: [id number]  HPI 63 y/o WF here for a follow up visit... she has multiple medical problems as noted below...   ~  August 06, 2009:  had back surg w/ fusion by Denise King at [facility] in Oct09... much difficulty after surg- out of work til 4/10 (ret part time) and 8/10 (just ret full time)... she is seeing Denise King for pain management now on EMBEDA 50-2 Bid + MSIR for breakthrough pain...  ~  January 01, 2012:  24moROV & it's been a [name] problems in several areas as reviewed below;  She also notes incr BP recently but she believes due to her incr pain level, and notes her pulse is always rapid> we discussed trial BBlocker for this w/ TOPROL XL 275md to start as she is scared of side effects of medications...    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    HBP> not currently on meds & she thinks hi readings at home are due to her pain; BP= 126/90 & pulse= 94 (readings higher at home); we decided on trial of low dose BBlocker ToprolXL 2580m as noted above...    GI> managed by Denise King & seen every 2-34mo234moe is c/o constant pain & diarrhea; now on Lotronex which seems to help the diarrhea but not her pain...    Chr Pain> Hx severe back problems- Denise King, & pain managed by Denise King on Opana20Bid & Percocet10- now taking 5/d; "they can't go higher & pain is getting worse"...    Nerves/ Insomnia> Denise King started Xanax 1mgQ34m& she adds OTC Unisom25mgQ66mwe have filled her Rx for Cymbalta 60mgQh87m~  March 30, 2012:  34mo ROV79moere for recheck of her BP which is fair at 130/90 on Metoprolol25Bid, Lasix20/d, & K10/s; she notes that the BBlocker "adds another layer of tiredness" she says; we discussed adding LOSARTAN 50mg/d &70mking off the Metoprolol to 25mg/d (d12mding on her  response we may be able to incr the Losar & wean the Metop off)...  Her CC today however is abd pain & discomfort w/ nausea "mostly on left side but really all over abd"; she states this is different than her [name] symptoms; she reports going to [facility] (see [facility] 3/27 by Denise King) & they felt she had prob diverticulitis & treated her w/ Cipro & Flagyl (CBC was normal w/ Hg=13.1, WBC=10.6 w/ 48% neutrophils, CMet-wnl, Amylase/Lipase-wnl, Urine-clear);  She reports NO BETTER after the antibiotics & reports bloating, constipated, w/ one sm BM daily ("I'm on the BRAT diet");  Past hx severe GERD w/ erosions on Protonix daily & last EGD 1/10 was neg;  Normal gastric emptying scan;  CTAbd 1/11 & 2/11 showed no acute dis just retained stool throughout colon & cyst in liver, kidney, & 8mm cystic12msion of the ventral pancreatic body (no change from 2006); symptoms felt to be secondary to narcotic analgesics she takes for her chronic back pain & chronic pain syndrome... Note: no divertics on CT Abd, but Denise King saw some on colonoscopy 1/12 (otherw unremarkable);  Last procedure was a CT Enterography 11/12 w/o acute process identified (the 8mm pseudoc59m in pancreas was seen);  She has PROTONIX, [place].  We discussed Rx w/ Anti-gas medications MYLICON &  PHAZYME, and to be sure she's not constip w/ her narcotic analgesics- rec to take Citrate of Magnesia + water, & Dulcolax if needed;  We will arrange for f/u w/ her gastroenterologist...  CT ABD 4/13 repeat==> neg for acute abn, but CBD is dil at 8.64m (prev6.3444m- no stones or obstructing mass seen; GB appears normal; incidental findings include some scarring at right base, 44m62meft hep cyst, kidneys & pancreas appear normal, osteopenia & post op L4-5 laminectomy & fusion...  EGD 4/13 showed gastritis, bx= neg for HPylori...  ~  July 15, 2012:  49mo40mo & Denise persists w/ mult somatic complaints> dizzy, sleepy, elevHR w/ palpit, nauseated,  4+stress (63y/o daugh w/ severe migraines); we decided to add low dose Atenolol 25mg37m2 to 1 tab for the HR & palpit...    [facility]> [facility] eval is reviewed...    She then followed up w/ Denise King for GI; [facility] Abd pain, diarrhea, gastritis on Protonix, [name] on Lotronex;  ?they stopped the Protonix because she was concerned about ospeopenia;     We reviewed prob list, meds, xrays and labs> see below>>  ~  August 15, 2013:  82yr R24yr CPX> [name] smoking, denies resp difficulties & her CC remains her chr pain issues, managed by Denise King pain clinic;  We reviewed the following medical problems during today's office visit >>     Smoker> she continues smoking ~1ppd & denies any recent URIs, bronchitis, etc; CXR today showed COPD, bilat nipple shadows...    HBP> supposed to be on Aten25Bliss Cornerhe stopped all meds; BP= 118/74 & she is encouraged to monitor BP at home, ok off meds at this point.    GI> managed by Denise King c/o constant pain & diarrhea; now on Lotronex which seems to help the diarrhea, Levsin SL, Zofran prn nausea;  Rec to add Protonix40/d & f/u w/ GI...    Chr Pain> Hx severe back problems- Denise King, & pain managed by Denise King on Opana30Bid & Percocet7.5; also on Cymbalta60; c/o right shoulder & knee today- offered Ortho eval.    Osteoporosis> followed by GYN (Denise King) on Reclast infusion yearly; [facility]...    Vit B12 Defic> on monthly B12 shots; Labs showed VitB12 level = 612...    Nerves/ Insomnia> on Xanax 0.5mgTid19mshe adds OTC Unisom25mgQhs70m have filled her Rx for Cymbalta 60mgQhs;644mer incr stress caring for ill 63 y/o mo82er. We reviewed prob list, meds, xrays and labs> see below for updates >>   CXR 8/14 showed norm heart size, COPD/emphysema, nipple shadows bilat, mild scoliosis of spine => repeat w/ nipple markers.  LABS 8/14:  FLP- at goals on  diet alone;  Chems- wnl;  CBC- ok x Hg=17.3;  TSH=0.92;  VitD=42;  VitB12=612;  UA=clear...  ~  April 03, 2014:  61mo ROV &4monie is Denise Kaysor f/u mult issues reviewed below> We reviewed prob list, meds, xrays and labs>     Smoker> not on breathing meds, she declines smoking cessation help; she continues smoking ~1ppd & denies any recent URIs, bronchitis, etc; last CXR was 8/14 showing COPD, bilat nipple shadows, norm heart size, scoliosis; she had fall 10/14 w/ back pain & CT Tspine showed an incidental 4mm pleura54mased nodule over the right apex- rec for f/u Chest CT in 80yr...    H280yr> prev on Aten25 &  Losar50- she stopped these in 2014 & BP has remained satis off meds since then; BP= 120/84 today; she denies CP, palpit, SOB, edema, and no cerebral ischemic symptoms...    GI> Hx GERD on Protonix40, Divertics and [name] w/ diarrhea on Lotonex50m, LevsinSL0.125, Zofran4, Florastor; followed by DDemetra Shiner she had EGD w/ bx 2/15 showing gastritis in the antrum & diminutive gastic polyp (+gastritis, neg HPylori, no villous atrophy); neg stool work-up for diarrhea and treated empirically w/ Flagyl; she notes "I still have diarrhea every day" and "they don't know what else to do for me"; we discussed adjusting the Lotronex to control diarrhea & avoid constip...    Chr Pain> Hx severe back problems- Denise King, & pain managed by Denise King on OpanaBid & Percocet prn; also on Cymbalta60; [facility] DrRichardson in Ortho- spinal stenosis, scoliosis, prior L4-sacrum fusion w/ some loosening of the sacral screws; he offered facet injections but didn't think further surg would help, pt states "nothing they could do for me"...    Osteoporosis> followed by GYN ([facility] on Reclast infusion yearly; [facility]; she is reminded to maintain regular folloow up w/ GYN...    Vit B12 Defic> on monthly B12 shots; Labs 8/14 showed VitB12 level = 612...     Nerves/ Depression/ Insomnia> on Xanax 0.510mid as needed & she adds OTC Unisom2537ms; we have filled her Rx for Cymbalta 53m73m; under incr stress caring for elderly mother & 2 daughters getting married in 2015; she was seen by TP 11/14 c/o depression and started on zoloft w/ improvement- dose titrated up to 100mg51mshe is now asking about adding Abilify as suggested to her by a friend- I offered referral to Psychiatrist for help w/ this...  LABS 1/15:  Chems- ok x K=3.3;  CBC- wnl;  Celiac dis panel was neg;  Stool pathogen panel was neg...            Problem List:  VERTIGO (ICD-780.4) - Hosp 8/08 w/ neg eval and eventually referred to Neurology w/ nothing found; Rx'd w/ Antivert, Phenergan, vestib therapy...  BRONCHITIS, RECURRENT (ICD-491.9) - 1ppd smoker w/ hx recurrent bronchitis... she refuses smoking cessation counseling, Chantix, etc... notes min cough, sputum & SOB- sedentary due to back prob... ~  CXR 2/12 showed normal heart size, COPD w/ sl incr markings, nipple shadows noted... 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 ~  CXR 8/14 showed norm heart size, COPD/emphysema, nipple shadows bilat, mild scoliosis of spine => repeat w/ nipple markers ~  NOTE> she had fall 10/14 w/ back pain & CT Tspine showed an incidental 4mm p4mral based nodule over the right apex- rec for f/u Chest CT in 86yr...74yre c/o heart rate too high >> we rec trial Aten 25mg- 153mo 1 tab daily 7/13... ~  EKG 2/14 showed NSR, rate84, wnl, NAD... ~  She stopped Aten25 &[place] & BP has remained well controlled on diet alone...  VENOUS INSUFFICIENCY (ICD-459.81) - she knows to avoid sodium, elevate legs, wear support hose... has LASIX 20mg Prn8mKCl 20mEq.  R53mX ESOPHAGITIS (ICD-530.11) >>  ~  EGD 9/05 by DrPerry showed mod severe reflux esoph w/ erosions,  Rx PROTONIX 40mg/d reg57mly w/ good control, but states she needs it Bid;  She uses ZOFRAN 4mg as need20m for nausea. ~  4/13:  She had EGD  Denise King w/ mod gastritis, Bx neg HPylori, Rx w/ Proptonix40... ~  2/15: she had EGD w/ bx 2/15 showing gastritis in the antrum & diminutive gastic polyp (+gastritis, neg HPylori, no villous atrophy)...  DIVERTICULOSIS OF COLON (ICD-562.10), &  [name] (ICD-564.1) - on LEVSIN .125 SL Q4H prn> prev on Levbid & Librax... ~  extensive GI eval by Denise King 1/10 for abd pain, [name], etc... neg EGD, neg CTAbd, neg Emptying scan... believed secondary to narcotic analgesics... ~  on-going GI eval by Denise King w/ repeat CT Abd 2/11 (stable benign cysts liver/ pancreas/ renal), Colonoscopy 1/12 (sigm divertics & neg biopsies) etc... ~  Continued GI evaluation by Denise King in 2012 w/ ER visit 8/12 for [name] w/ N/ V; & Labs/ CT enterography, etc;  LOTRONEX helps her diarrhea + Probiotics. ~  4/13:  Recurrent severe abd pain, bloating, nausea, etc> SEE ABOVE & referred back to GI Denise King... ~  CT Abd&Pelvis 2/14 showed mild scarring R base, smHH, 47m cyst L hepatic lobe, norm GB & dilated CBD unchanged from prev, bilat renal cysts, mod stool burden, s/p appy, atherosclerotic changes in Ao & branch vessels, no adenopathy, uterus & ovaries ok, s/p L4-S1 surg ~  2/14:  Denise King did diagnostic laparoscopy to try to find reason for her LLQ abd pain> few adhesions, otherw neg; remarkably pt noted decr pain for 677mofter the procedure (but upset that it cost her $1600)... ~  4/15: neg GI stool work-up for diarrhea and treated empirically w/ Flagyl; she notes "I still have diarrhea every day" and "they don't know what else to do for me"; we discussed adjusting the Lotronex to control diarrhea & avoid constip...  COLONIC POLYPS (ICD-211.3) >>  ~  prev colonoscopy 2/06 by DrDemetra Shinerhowed Divertic, otherw neg; f/u 5y34yr/ +fam hx colon ca. ~  as above> f/u colon 1/12 w/ divertics otherw neg... ~  CT Abd 2/14 showed mod stool burden, smHH, 6mm88mp cyst on left, sm bilat renal cysts, prior appy,  atherosclerotic calci Ao, spine surg....  CALCULUS, KIDNEY (ICD-592.0) - Hx of Kidney Stones & small renal cysts on CT scan & sonar - followed by DrWrenn.  BACK PAIN, LUMBAR (ICD-724.2) - initial eval and rx by [facility]DD[facility] and no surg possible... she's had series of epidural shots from DrStrong without relief... [facility]... referred to a chronic pain clinic... ~  fall w/ injury 2009 & further eval from DrCoPalisades Parke decided to proceed w/ spinal surgery & [place] & now followed in the pain management clinic by Denise King. ~  she [facility] additional surg"... ~  [facility]> spinal stenosis, scoliosis, prior L4-sacrum fusion w/ some loosening of the sacral screws; he offered facet injections but didn't think further surg would help, pt states "nothing they could do for me"...  CHRONIC PAIN SYNDROME (ICD-338.4) - followed by Denise King pain clinic... currently on OPANA ER 20mg43m & PERCOCET10- up to 5 daily... also on CYMBALTA 60mg/64m  OSTEOPENIA (ICD-733.90) - she was prev on EVISTA, Ca++, Vits (w/ BMD per GYN)... she stopped the Evista due to nausea which she feels is better off this med... [facility]/ RECLAST infusion... ~  labs 3/09 showed Vit d level = 12... started on Vit D 50000 u weekly. ~  labs 6/10 showed Vit D level =  49... OK to switch to 1000 u OTC daily. ~  f/u GYN eval and BMD at [facility] office> she reports Rx w/ RECLAST for osteoposis (we don't have records).  HEADACHE (ICD-784.0)  ANXIETY (ICD-300.00) - prev on Lexapro & Xanax, but these were stopped in favor of CYMBALTA 71m/d; prev on Lunesta but Denise King switched her to XANAX 168mhs & she added Unisom 2573mTC...  VITAMIN B12 DEFICIENCY (ICD-266.2) -  ~  labs 1010 showed Vit B12 level = 118 & started on  B 12 shots monthly...  ~  labs 2/11 showed Vit B12 level = >1500... for some reason she stopped the B12 shots in 2011. ~  labs 2/12 showed Vit B12 level = 213... rec to resume B12 shots monthly. ~  Labs 8/12 by Denise King showed Vit B12 level = 517, Folate = 6.1 ~  Labs 3/13 showed B12 level = 361 ~  Labs 8/14 showed VitB12 level = 616  HEALTH MAINTENANCE:  GYN= Denise King & PAP, Mammogram, etc... all up-to-date; she is due a BMD in 1/10; and colon 10/11...   Past Surgical History  Procedure Laterality Date  . Appendectomy    . Tubal ligation    . Back surgery  10-09    Dr. CohPatrice Paradise Tonsillectomy    . Laparoscopy N/A 02/21/2013    Procedure: LAPAROSCOPY OPERATIVE;  Surgeon: RicMargarette AsalD;  [facility] [facility] [place];  Service: Gynecology;  Laterality: N/A;  REQUESTING 5MM SCOPE WITH CAMERA  . Colonoscopy    . Upper gastrointestinal endoscopy      Outpatient Encounter Prescriptions as of 04/03/2014  Medication Sig  . alosetron (LOTRONEX) 1 MG tablet Take 1 mg by mouth daily as needed (for diarrhea).   . ALPRAZolam (XANAX) 0.5 MG tablet Take 1/2 to 1 tablet by mouth three times daily as needed.  . cyanocobalamin (,VITAMIN B-12,) 1000 MCG/ML injection Inject 1,000 mcg into the muscle every 30 (thirty) days.  . Doxylamine Succinate, Sleep, (UNISOM) 25 MG tablet Take 25 mg by mouth at bedtime.   . DULoxetine (CYMBALTA) 60 MG capsule TAKE 1 CAPSULE EVERY DAY.  . hyoscyamine (LEVSIN SL) 0.125 MG SL tablet Place 1 tablet (0.125 mg total) under the tongue every 4 (four) hours as needed for cramping.  . lidocaine (LIDODERM) 5 % As needed  . ondansetron (ZOFRAN) 4 MG tablet Take 4 mg by mouth every 4 (four) hours as needed for nausea.  . oMarland KitchenyCODONE-acetaminophen (PERCOCET/ROXICET) 5-325 MG per tablet 1 every 4-6 hours as needed for pain  . oxymorphone (OPANA ER) 20 MG 12 hr tablet Take 20 mg by mouth every 12 (twelve) hours.  . pantoprazole (PROTONIX) 40 MG tablet Take 1 tablet (40 mg total) by mouth  daily. 30 minutes  before  the first meal of the day  . saccharomyces boulardii (FLORASTOR) 250 MG capsule Take 1 capsule (250 mg total) by mouth 2 (two) times daily.  . sertraline (ZOLOFT) 100 MG tablet Take 1 tablet (100 mg total) by mouth daily.  . zoledronic acid (RECLAST) 5 MG/100ML SOLN Inject 5 mg into the vein once. Patient is due for one when she feels better  . [DISCONTINUED] DULoxetine (CYMBALTA) 60 MG capsule TAKE 1 CAPSULE EVERY DAY.  . [DISCONTINUED] pantoprazole (PROTONIX) 40 MG tablet Take 1 tablet (40 mg total) by mouth daily. 30 minutes  before  the first meal of the day  . [DISCONTINUED] atenolol (TENORMIN) 25 MG tablet Take 12.5-25 mg by mouth daily.  . [DISCONTINUED] losartan (COZAAR) 50 MG  tablet Take 1 tablet (50 mg total) by mouth daily.  . [DISCONTINUED] Multiple Vitamins-Minerals (MULTIVITAMIN WITH MINERALS) tablet Take 1 tablet by mouth daily.    . [DISCONTINUED] potassium chloride SA (K-DUR,KLOR-CON) 20 MEQ tablet Take one BID x 5 days  . [DISCONTINUED] sucralfate (CARAFATE) 1 G tablet Take 1 tablet (1 g total) by mouth 4 (four) times daily.    Allergies  Allergen Reactions  . Atorvastatin Nausea And Vomiting  . Levofloxacin     Reaction: achilles tendon pain  . Omnicef [Cefdinir] Diarrhea    Uncontrollable diarrhea  . Trazodone And Nefazodone     "Felt like I was going to faint"  . Sulfonamide Derivatives Rash    Current Medications, Allergies, Past Medical History, Past Surgical History, Family History, and Social History were reviewed in [employer] record.    Review of Systems        See HPI - all other systems neg except as noted...  The patient complains of dyspnea on exertion, headaches, abdominal pain, and depression.  The patient denies anorexia, fever, weight loss, weight gain, vision loss, decreased hearing, hoarseness, chest pain, syncope, peripheral edema, prolonged cough, hemoptysis, melena, hematochezia, severe  indigestion/heartburn, hematuria, incontinence, muscle weakness, suspicious skin lesions, transient blindness, difficulty walking, unusual weight change, abnormal bleeding, enlarged lymph nodes, and angioedema.     Objective:   Physical Exam     WD, Thin, 63 y/o WF in NAD... GENERAL:  Alert & oriented; pleasant & cooperative... HEENT:  [place]/AT,  EACs-clear, TMs-wnl, NOSE-clear, THROAT-clear but dryMMs. NECK:  Supple w/ fairROM; no JVD; normal carotid impulses w/o bruits; no thyromegaly or nodules palpated; no lymphadenopathy. CHEST:  Clear to P & A; without wheezes/ rales/ or rhonchi heard... HEART:  Regular Rhythm; without murmurs/ rubs/ or gallops detected... ABDOMEN:  Soft & mild tender diffusely in abd; normal bowel sounds; no organomegaly or masses palpated... EXT: without deformities, mild arthritic changes; no varicose veins/ +venous insuffic/ no edema.,  limp w/ walking.  BACK:  c/o mod severe back pain... NEURO:  no focal neuro deficits... DERM:  no lesions seen...  RADIOLOGY DATA:  Reviewed in the EPIC EMR & discussed w/ the patient...    LABORATORY DATA:  Reviewed in the EPIC EMR & discussed w/ the patient...   Assessment:      HxHBP & elev HR> she stopped all BP meds and BP remains wnl on diet alone now, denies tachycardia, palpit, dizzy, etc...  Smoker, Chr Bronchitis>  Must quit all smoking but she refuses smoking cessation help; doesn't feel her breathing is bad enough for chronic meds/ inhalers/ etc; CT of Tspine 10/14 after a fall showed 95m R apex nodule that will need f/u CXR/ CT 137yr.  Ven Insuffic>  On low sodium diet & no edema...  Abd Pain, Bloating, etc>> GERD, Divertics, [name], +FamHx colon ca>  On Protonix, Levsin, Lotronex, Zofran, Align; we discussed trying Anti-gas medication w/ MYLICON & PHAZYME; she has had extensive eval by GI- Denise King & AE...  Hx Kidney Stones>  Followed by DrWrenn...  DJD, LBP>  Followed by DrRichmondho did surg in [place]  10/09 (she has also seen DrPricilla Hol[facility]DrBranch [facility]  Chronic Pain Syndrome>  Managed by Denise King pain clinic on meds as noted...  Anxiety, Depression, & Insomnia>  On meds listed- she wants Abilify added but I am not comfortable w/ this & will refer to Psychiatry for their review...  Vit B12 Defic>  On B12 shots  monthly...     Plan:     Patient's Medications  New Prescriptions   No medications on file  Previous Medications   ALOSETRON (LOTRONEX) 1 MG TABLET    Take 1 mg by mouth daily as needed (for diarrhea).    ALPRAZOLAM (XANAX) 0.5 MG TABLET    Take 1/2 to 1 tablet by mouth three times daily as needed.   CYANOCOBALAMIN (,VITAMIN B-12,) 1000 MCG/ML INJECTION    Inject 1,000 mcg into the muscle every 30 (thirty) days.   DOXYLAMINE SUCCINATE, SLEEP, (UNISOM) 25 MG TABLET    Take 25 mg by mouth at bedtime.    HYOSCYAMINE (LEVSIN SL) 0.125 MG SL TABLET    Place 1 tablet (0.125 mg total) under the tongue every 4 (four) hours as needed for cramping.   LIDOCAINE (LIDODERM) 5 %    As needed   ONDANSETRON (ZOFRAN) 4 MG TABLET    Take 4 mg by mouth every 4 (four) hours as needed for nausea.   OXYCODONE-ACETAMINOPHEN (PERCOCET/ROXICET) 5-325 MG PER TABLET    1 every 4-6 hours as needed for pain   OXYMORPHONE (OPANA ER) 20 MG 12 HR TABLET    Take 20 mg by mouth every 12 (twelve) hours.   SACCHAROMYCES BOULARDII (FLORASTOR) 250 MG CAPSULE    Take 1 capsule (250 mg total) by mouth 2 (two) times daily.   SERTRALINE (ZOLOFT) 100 MG TABLET    Take 1 tablet (100 mg total) by mouth daily.   ZOLEDRONIC ACID (RECLAST) 5 MG/100ML SOLN    Inject 5 mg into the vein once. Patient is due for one when she feels better  Modified Medications   Modified Medication Previous Medication   DULOXETINE (CYMBALTA) 60 MG CAPSULE DULoxetine (CYMBALTA) 60 MG capsule      TAKE 1 CAPSULE EVERY DAY.    TAKE 1 CAPSULE EVERY DAY.   PANTOPRAZOLE (PROTONIX) 40 MG TABLET pantoprazole (PROTONIX) 40 MG  tablet      Take 1 tablet (40 mg total) by mouth daily. 30 minutes  before  the first meal of the day    Take 1 tablet (40 mg total) by mouth daily. 30 minutes  before  the first meal of the day  Discontinued Medications   ATENOLOL (TENORMIN) 25 MG TABLET    Take 12.5-25 mg by mouth daily.   LOSARTAN (COZAAR) 50 MG TABLET    Take 1 tablet (50 mg total) by mouth daily.   MULTIPLE VITAMINS-MINERALS (MULTIVITAMIN WITH MINERALS) TABLET    Take 1 tablet by mouth daily.     POTASSIUM CHLORIDE SA (K-DUR,KLOR-CON) 20 MEQ TABLET    Take one BID x 5 days   SUCRALFATE (CARAFATE) 1 G TABLET    Take 1 tablet (1 g total) by mouth 4 (four) times daily.

## 2014-04-03 NOTE — Patient Instructions (Signed)
Today we updated your med list in our EPIC system...    Continue your current medications the same...    We refilled the meds you requested...  We will arrange for a consultation w/ [facility] to see if they would recommend the addition of Abilify for your depression...  Call for any questions or if we can be of service in any way.Marland KitchenMarland Kitchen

## 2014-04-17 ENCOUNTER — Ambulatory Visit (INDEPENDENT_AMBULATORY_CARE_PROVIDER_SITE_OTHER): Payer: 59 | Admitting: Gastroenterology

## 2014-04-17 ENCOUNTER — Encounter: Payer: Self-pay | Admitting: Gastroenterology

## 2014-04-17 VITALS — BP 110/70 | HR 100 | Ht 63.0 in | Wt 123.4 lb

## 2014-04-17 DIAGNOSIS — K589 Irritable bowel syndrome without diarrhea: Secondary | ICD-10-CM

## 2014-04-17 MED ORDER — ALOSETRON HCL 0.5 MG PO TABS: 0.5000 mg | ORAL_TABLET | Freq: Two times a day (BID) | ORAL | Status: DC

## 2014-04-17 NOTE — Assessment & Plan Note (Signed)
Patient has severe [name] diarrhea predominant.  In the past she has responded to Lotronex.  There is no evidence for celiac disease.  Week on  Recommendations #1 restart Lotronex 0.5 mg twice a day every other day #2 to consider trial of a gluten-free diet or FODMAP diet

## 2014-04-17 NOTE — Patient Instructions (Addendum)
You have been given a separate informational sheet regarding your tobacco use, the importance of quitting and local resources to help you quit. Call back in 2 weeks to let us know how your doing Follow up in 6 weeks

## 2014-04-17 NOTE — Progress Notes (Signed)
          History of Present Illness:  The patient has returned for followup of diarrhea.  Unfortunately, symptoms continue.  Stool studies were negative.  Celiac panel was also negative except for an elevated IgG antigliadin antibody.  Biopsies of the small bowel were unremarkable.  She has taken Lotronex in the past with success but claims that daily dosage causes constipation.  Therapy was not successful in January in alleviating diarrhea.  The diarrhea she may have abdominal bloating and incontinence.    Review of Systems: Pertinent positive and negative review of systems were noted in the above HPI section. All other review of systems were otherwise negative.    Current Medications, Allergies, Past Medical History, Past Surgical History, Family History and Social History were reviewed in [facility] record  Vital signs were reviewed in today's medical record. Physical Exam: General: Well developed , well nourished, no acute distress   See Assessment and Plan under Problem List

## 2014-04-18 ENCOUNTER — Telehealth: Payer: Self-pay | Admitting: *Deleted

## 2014-04-18 DIAGNOSIS — K589 Irritable bowel syndrome without diarrhea: Secondary | ICD-10-CM

## 2014-04-18 MED ORDER — ALOSETRON HCL 0.5 MG PO TABS: 0.5000 mg | ORAL_TABLET | Freq: Two times a day (BID) | ORAL | Status: DC

## 2014-04-18 NOTE — Telephone Encounter (Signed)
Called pt to inform her she has to pick up the Lotronex rx and take it to her pharmacy to be filled. Dr Deatra Ina had submitted electronically

## 2014-04-26 ENCOUNTER — Other Ambulatory Visit: Payer: Self-pay | Admitting: Pulmonary Disease

## 2014-06-04 ENCOUNTER — Ambulatory Visit (INDEPENDENT_AMBULATORY_CARE_PROVIDER_SITE_OTHER): Payer: 59

## 2014-06-04 DIAGNOSIS — E538 Deficiency of other specified B group vitamins: Secondary | ICD-10-CM

## 2014-06-05 MED ORDER — CYANOCOBALAMIN 1000 MCG/ML IJ SOLN
1000.0000 ug | Freq: Once | INTRAMUSCULAR | Status: AC
  Administered 2014-06-05: 1000 ug via INTRAMUSCULAR

## 2014-07-19 ENCOUNTER — Other Ambulatory Visit: Payer: Self-pay | Admitting: Orthopedic Surgery

## 2014-07-20 ENCOUNTER — Ambulatory Visit (INDEPENDENT_AMBULATORY_CARE_PROVIDER_SITE_OTHER): Payer: 59

## 2014-07-20 DIAGNOSIS — E538 Deficiency of other specified B group vitamins: Secondary | ICD-10-CM

## 2014-07-24 MED ORDER — CYANOCOBALAMIN 1000 MCG/ML IJ SOLN
1000.0000 ug | Freq: Once | INTRAMUSCULAR | Status: AC
  Administered 2014-07-24: 1000 ug via INTRAMUSCULAR

## 2014-08-08 ENCOUNTER — Encounter (HOSPITAL_BASED_OUTPATIENT_CLINIC_OR_DEPARTMENT_OTHER): Payer: Self-pay | Admitting: *Deleted

## 2014-08-08 NOTE — Progress Notes (Signed)
No labs needed

## 2014-08-13 ENCOUNTER — Other Ambulatory Visit: Payer: Self-pay | Admitting: Orthopedic Surgery

## 2014-08-14 ENCOUNTER — Encounter (HOSPITAL_BASED_OUTPATIENT_CLINIC_OR_DEPARTMENT_OTHER): Payer: Self-pay | Admitting: Anesthesiology

## 2014-08-14 ENCOUNTER — Ambulatory Visit (HOSPITAL_BASED_OUTPATIENT_CLINIC_OR_DEPARTMENT_OTHER): Payer: 59 | Admitting: Anesthesiology

## 2014-08-14 ENCOUNTER — Encounter (HOSPITAL_BASED_OUTPATIENT_CLINIC_OR_DEPARTMENT_OTHER): Payer: 59 | Admitting: Anesthesiology

## 2014-08-14 ENCOUNTER — Encounter (HOSPITAL_BASED_OUTPATIENT_CLINIC_OR_DEPARTMENT_OTHER)
Admission: RE | Disposition: A | Discharge status: Home / Self Care | Payer: Self-pay | Source: Ambulatory Visit | Attending: Orthopedic Surgery

## 2014-08-14 ENCOUNTER — Ambulatory Visit (HOSPITAL_BASED_OUTPATIENT_CLINIC_OR_DEPARTMENT_OTHER)
Admission: RE | Admit: 2014-08-14 | Discharge: 2014-08-14 | Disposition: A | Discharge status: Home / Self Care | Payer: 59 | Source: Ambulatory Visit | Attending: Orthopedic Surgery | Admitting: Orthopedic Surgery

## 2014-08-14 DIAGNOSIS — I10 Essential (primary) hypertension: Secondary | ICD-10-CM | POA: Insufficient documentation

## 2014-08-14 DIAGNOSIS — Z860100 Personal history of colon polyps, unspecified: Secondary | ICD-10-CM | POA: Insufficient documentation

## 2014-08-14 DIAGNOSIS — K2100 Gastro-esophageal reflux disease with esophagitis, without bleeding: Secondary | ICD-10-CM | POA: Diagnosis not present

## 2014-08-14 DIAGNOSIS — F411 Generalized anxiety disorder: Secondary | ICD-10-CM | POA: Diagnosis not present

## 2014-08-14 DIAGNOSIS — Z888 Allergy status to other drugs, medicaments and biological substances status: Secondary | ICD-10-CM | POA: Insufficient documentation

## 2014-08-14 DIAGNOSIS — M810 Age-related osteoporosis without current pathological fracture: Secondary | ICD-10-CM | POA: Insufficient documentation

## 2014-08-14 DIAGNOSIS — E538 Deficiency of other specified B group vitamins: Secondary | ICD-10-CM | POA: Diagnosis not present

## 2014-08-14 DIAGNOSIS — G5600 Carpal tunnel syndrome, unspecified upper limb: Secondary | ICD-10-CM | POA: Insufficient documentation

## 2014-08-14 DIAGNOSIS — Z882 Allergy status to sulfonamides status: Secondary | ICD-10-CM | POA: Diagnosis not present

## 2014-08-14 DIAGNOSIS — G8929 Other chronic pain: Secondary | ICD-10-CM | POA: Diagnosis not present

## 2014-08-14 DIAGNOSIS — M899 Disorder of bone, unspecified: Secondary | ICD-10-CM | POA: Insufficient documentation

## 2014-08-14 DIAGNOSIS — F17200 Nicotine dependence, unspecified, uncomplicated: Secondary | ICD-10-CM | POA: Insufficient documentation

## 2014-08-14 DIAGNOSIS — K589 Irritable bowel syndrome without diarrhea: Secondary | ICD-10-CM | POA: Insufficient documentation

## 2014-08-14 DIAGNOSIS — M19049 Primary osteoarthritis, unspecified hand: Secondary | ICD-10-CM | POA: Diagnosis not present

## 2014-08-14 DIAGNOSIS — M949 Disorder of cartilage, unspecified: Secondary | ICD-10-CM

## 2014-08-14 HISTORY — DX: Presence of spectacles and contact lenses: Z97.3

## 2014-08-14 HISTORY — PX: CARPAL TUNNEL RELEASE: SHX101

## 2014-08-14 LAB — POCT HEMOGLOBIN-HEMACUE: Hemoglobin: 17.4 g/dL — ABNORMAL HIGH (ref 12.0–15.0)

## 2014-08-14 SURGERY — CARPAL TUNNEL RELEASE
Anesthesia: General | Site: Wrist | Laterality: Right

## 2014-08-14 MED ORDER — OXYCODONE HCL 5 MG PO TABS
5.0000 mg | ORAL_TABLET | Freq: Once | ORAL | Status: AC | PRN
  Administered 2014-08-14: 5 mg via ORAL

## 2014-08-14 MED ORDER — FENTANYL CITRATE 0.05 MG/ML IJ SOLN: 50.0000 ug | INTRAMUSCULAR | Status: DC | PRN

## 2014-08-14 MED ORDER — BETAMETHASONE SOD PHOS & ACET 6 (3-3) MG/ML IJ SUSP
INTRAMUSCULAR | Status: AC
  Filled 2014-08-14: qty 1

## 2014-08-14 MED ORDER — DEXAMETHASONE SODIUM PHOSPHATE 4 MG/ML IJ SOLN
INTRAMUSCULAR | Status: DC | PRN
  Administered 2014-08-14: 10 mg via INTRAVENOUS

## 2014-08-14 MED ORDER — LIDOCAINE HCL (PF) 1 % IJ SOLN
INTRAMUSCULAR | Status: DC | PRN
  Administered 2014-08-14: 1 mL

## 2014-08-14 MED ORDER — FENTANYL CITRATE 0.05 MG/ML IJ SOLN
INTRAMUSCULAR | Status: DC | PRN
  Administered 2014-08-14: 100 ug via INTRAVENOUS

## 2014-08-14 MED ORDER — MIDAZOLAM HCL 2 MG/2ML IJ SOLN
INTRAMUSCULAR | Status: AC
  Filled 2014-08-14: qty 2

## 2014-08-14 MED ORDER — OXYCODONE HCL 5 MG PO TABS
ORAL_TABLET | ORAL | Status: AC
  Filled 2014-08-14: qty 1

## 2014-08-14 MED ORDER — VANCOMYCIN HCL IN DEXTROSE 1-5 GM/200ML-% IV SOLN: 1000.0000 mg | INTRAVENOUS | Status: DC

## 2014-08-14 MED ORDER — ONDANSETRON HCL 4 MG/2ML IJ SOLN: 4.0000 mg | Freq: Once | INTRAMUSCULAR | Status: DC | PRN

## 2014-08-14 MED ORDER — BUPIVACAINE HCL (PF) 0.25 % IJ SOLN: INTRAMUSCULAR | Status: DC | PRN

## 2014-08-14 MED ORDER — BETAMETHASONE SOD PHOS & ACET 6 (3-3) MG/ML IJ SUSP
INTRAMUSCULAR | Status: DC | PRN
  Administered 2014-08-14: 1 mL via INTRA_ARTICULAR

## 2014-08-14 MED ORDER — LIDOCAINE HCL (CARDIAC) 20 MG/ML IV SOLN
INTRAVENOUS | Status: DC | PRN
  Administered 2014-08-14: 80 mg via INTRAVENOUS

## 2014-08-14 MED ORDER — PROPOFOL 10 MG/ML IV BOLUS
INTRAVENOUS | Status: DC | PRN
  Administered 2014-08-14: 200 mg via INTRAVENOUS

## 2014-08-14 MED ORDER — ONDANSETRON HCL 4 MG/2ML IJ SOLN
INTRAMUSCULAR | Status: DC | PRN
  Administered 2014-08-14: 4 mg via INTRAVENOUS

## 2014-08-14 MED ORDER — VANCOMYCIN HCL IN DEXTROSE 1-5 GM/200ML-% IV SOLN
INTRAVENOUS | Status: AC
  Filled 2014-08-14: qty 200

## 2014-08-14 MED ORDER — CHLORHEXIDINE GLUCONATE 4 % EX LIQD: 60.0000 mL | Freq: Once | CUTANEOUS | Status: DC

## 2014-08-14 MED ORDER — FENTANYL CITRATE 0.05 MG/ML IJ SOLN
INTRAMUSCULAR | Status: AC
  Filled 2014-08-14: qty 4

## 2014-08-14 MED ORDER — HYDROMORPHONE HCL PF 1 MG/ML IJ SOLN
INTRAMUSCULAR | Status: AC
  Filled 2014-08-14: qty 1

## 2014-08-14 MED ORDER — LACTATED RINGERS IV SOLN
INTRAVENOUS | Status: DC
  Administered 2014-08-14: 08:00:00 via INTRAVENOUS

## 2014-08-14 MED ORDER — BUPIVACAINE HCL (PF) 0.25 % IJ SOLN
INTRAMUSCULAR | Status: DC | PRN
  Administered 2014-08-14: 5 mL

## 2014-08-14 MED ORDER — KETOROLAC TROMETHAMINE 30 MG/ML IJ SOLN
INTRAMUSCULAR | Status: DC | PRN
  Administered 2014-08-14: 30 mg via INTRAVENOUS

## 2014-08-14 MED ORDER — VANCOMYCIN HCL IN DEXTROSE 1-5 GM/200ML-% IV SOLN
1000.0000 mg | INTRAVENOUS | Status: AC
  Administered 2014-08-14: 1000 mg via INTRAVENOUS

## 2014-08-14 MED ORDER — BUPIVACAINE HCL (PF) 0.25 % IJ SOLN
INTRAMUSCULAR | Status: AC
  Filled 2014-08-14: qty 30

## 2014-08-14 MED ORDER — HYDROMORPHONE HCL PF 1 MG/ML IJ SOLN
0.2500 mg | INTRAMUSCULAR | Status: DC | PRN
  Administered 2014-08-14 (×2): 0.5 mg via INTRAVENOUS

## 2014-08-14 MED ORDER — METHYLPREDNISOLONE ACETATE 40 MG/ML IJ SUSP
INTRAMUSCULAR | Status: AC
  Filled 2014-08-14: qty 1

## 2014-08-14 MED ORDER — MIDAZOLAM HCL 5 MG/5ML IJ SOLN
INTRAMUSCULAR | Status: DC | PRN
  Administered 2014-08-14: 2 mg via INTRAVENOUS

## 2014-08-14 MED ORDER — MIDAZOLAM HCL 2 MG/2ML IJ SOLN: 1.0000 mg | INTRAMUSCULAR | Status: DC | PRN

## 2014-08-14 MED ORDER — HYDROCODONE-ACETAMINOPHEN 5-325 MG PO TABS: 1.0000 | ORAL_TABLET | Freq: Four times a day (QID) | ORAL | Status: DC | PRN

## 2014-08-14 MED ORDER — LIDOCAINE HCL (PF) 1 % IJ SOLN
INTRAMUSCULAR | Status: AC
  Filled 2014-08-14: qty 5

## 2014-08-14 MED ORDER — OXYCODONE HCL 5 MG/5ML PO SOLN: 5.0000 mg | Freq: Once | ORAL | Status: AC | PRN

## 2014-08-14 SURGICAL SUPPLY — 37 items
BLADE SURG 15 STRL LF DISP TIS (BLADE) ×1 IMPLANT
BLADE SURG 15 STRL SS (BLADE) ×2
BNDG CMPR 9X4 STRL LF SNTH (GAUZE/BANDAGES/DRESSINGS) ×1
BNDG COHESIVE 3X5 TAN STRL LF (GAUZE/BANDAGES/DRESSINGS) ×2 IMPLANT
BNDG ESMARK 4X9 LF (GAUZE/BANDAGES/DRESSINGS) ×1 IMPLANT
BNDG GAUZE ELAST 4 BULKY (GAUZE/BANDAGES/DRESSINGS) ×2 IMPLANT
CHLORAPREP W/TINT 26ML (MISCELLANEOUS) ×2 IMPLANT
CORDS BIPOLAR (ELECTRODE) ×2 IMPLANT
COVER MAYO STAND STRL (DRAPES) ×2 IMPLANT
COVER TABLE BACK 60X90 (DRAPES) ×2 IMPLANT
CUFF TOURNIQUET SINGLE 18IN (TOURNIQUET CUFF) ×2 IMPLANT
DRAPE EXTREMITY T 121X128X90 (DRAPE) ×2 IMPLANT
DRAPE SURG 17X23 STRL (DRAPES) ×2 IMPLANT
DRSG PAD ABDOMINAL 8X10 ST (GAUZE/BANDAGES/DRESSINGS) ×2 IMPLANT
GAUZE SPONGE 4X4 12PLY STRL (GAUZE/BANDAGES/DRESSINGS) ×2 IMPLANT
GAUZE XEROFORM 1X8 LF (GAUZE/BANDAGES/DRESSINGS) ×2 IMPLANT
GLOVE BIOGEL PI IND STRL 7.0 (GLOVE) IMPLANT
GLOVE BIOGEL PI IND STRL 8.5 (GLOVE) ×1 IMPLANT
GLOVE BIOGEL PI INDICATOR 7.0 (GLOVE) ×1
GLOVE BIOGEL PI INDICATOR 8.5 (GLOVE) ×1
GLOVE ECLIPSE 6.5 STRL STRAW (GLOVE) ×1 IMPLANT
GLOVE EXAM NITRILE LRG STRL (GLOVE) ×1 IMPLANT
GLOVE SURG ORTHO 8.0 STRL STRW (GLOVE) ×2 IMPLANT
GOWN STRL REUS W/ TWL LRG LVL3 (GOWN DISPOSABLE) ×1 IMPLANT
GOWN STRL REUS W/TWL LRG LVL3 (GOWN DISPOSABLE) ×2
GOWN STRL REUS W/TWL XL LVL3 (GOWN DISPOSABLE) ×2 IMPLANT
NEEDLE 27GAX1X1/2 (NEEDLE) ×1 IMPLANT
NS IRRIG 1000ML POUR BTL (IV SOLUTION) ×2 IMPLANT
PACK BASIN DAY SURGERY FS (CUSTOM PROCEDURE TRAY) ×2 IMPLANT
STOCKINETTE 4X48 STRL (DRAPES) ×2 IMPLANT
SUT VICRYL 4-0 PS2 18IN ABS (SUTURE) IMPLANT
SUT VICRYL RAPIDE 4/0 PS 2 (SUTURE) ×2 IMPLANT
SYR 3ML 23GX1 SAFETY (SYRINGE) ×1 IMPLANT
SYR BULB 3OZ (MISCELLANEOUS) ×2 IMPLANT
SYR CONTROL 10ML LL (SYRINGE) ×1 IMPLANT
TOWEL OR 17X24 6PK STRL BLUE (TOWEL DISPOSABLE) ×2 IMPLANT
UNDERPAD 30X30 INCONTINENT (UNDERPADS AND DIAPERS) ×1 IMPLANT

## 2014-08-14 NOTE — Anesthesia Postprocedure Evaluation (Signed)
  Anesthesia Post-op Note  Patient: Denise King  Procedure(s) Performed: Procedure(s): RIGHT CARPAL TUNNEL RELEASE AND INJECT LEFT THUMB (Right)  Patient Location: PACU  Anesthesia Type: General   Level of Consciousness: awake, alert  and oriented  Airway and Oxygen Therapy: Patient Spontanous Breathing  Post-op Pain: mild  Post-op Assessment: Post-op Vital signs reviewed  Post-op Vital Signs: Reviewed  Last Vitals:  Filed Vitals:   08/14/14 0945  BP: 117/68  Pulse: 86  Temp:   Resp: 14    Complications: No apparent anesthesia complications

## 2014-08-14 NOTE — Anesthesia Preprocedure Evaluation (Addendum)
Anesthesia Evaluation  Patient identified by MRN, date of birth, ID band Patient awake    Reviewed: Allergy & Precautions, H&P , NPO status , Patient's Chart, lab work & pertinent test results  Airway Mallampati: I TM Distance: >3 FB Neck ROM: Full    Dental  (+) Teeth Intact, Dental Advisory Given   Pulmonary Current Smoker,  breath sounds clear to auscultation        Cardiovascular hypertension, Pt. on medications Rhythm:Regular Rate:Normal     Neuro/Psych    GI/Hepatic   Endo/Other    Renal/GU      Musculoskeletal   Abdominal   Peds  Hematology   Anesthesia Other Findings   Reproductive/Obstetrics                          Anesthesia Physical Anesthesia Plan  ASA: II  Anesthesia Plan: General   Post-op Pain Management:    Induction: Intravenous  Airway Management Planned: LMA  Additional Equipment:   Intra-op Plan:   Post-operative Plan: Extubation in OR  Informed Consent: I have reviewed the patients History and Physical, chart, labs and discussed the procedure including the risks, benefits and alternatives for the proposed anesthesia with the patient or authorized representative who has indicated his/her understanding and acceptance.   Dental advisory given  Plan Discussed with: CRNA, Anesthesiologist and Surgeon  Anesthesia Plan Comments:         Anesthesia Quick Evaluation

## 2014-08-14 NOTE — H&P (Signed)
Denise King is a 63 year old right hand dominant female with pain in her left thumb basilar area. She has been wearing a splint for this. This has been present for approximately 3 months. She complains of pain with gripping and pinching. She has no history of injury. She also has nocturnal symptoms with numbness and tingling especially to the right index, middle and ring finger waking him 4 out of 7 nights. She is complaining of pain at the MCP joint area of her index finger right hand. She has a history of arthritis. No history of diabetes, thyroid problems, or gout. There is a family history of arthritis. She has been on a Medrol Dosepak which gave her minimal relief. She is reluctant to take nonsteroidal anti-inflammatories due to other medical problems. She has been wearing a thumb spica splint on both sides which has given her some relief of symptoms. She has been on Percocet and Opana but this was recently changed to Victoria Vera along with the Percocet. She complains of constant, moderate, sharp, stabbing, throbbing and aching type pain with a feeling of swelling, numbness and weakness. She states it is getting worse. It does awaken her at night. Activity and work makes this worse. She has been wearing a hand brace. Ice seems to help also. She has had her nerve conductions done by Dr. Zebedee Iba revealing bilateral carpal tunnel syndrome, motor delay of 4.0/left and 6.4/right; sensory delay 2.4/left and 3.4/right.  Amplitude diminution to 33.3/left and 7.7/right.  She is not complaining of her CMC arthritis on her right side.  We would recommend serious consideration to carpal tunnel release on that side.  We would not recommend doing anything to the [facility] arthritis.    PAST MEDICAL HISTORY: She has the following allergies: Atorvastatin, levofloxacin, Omnicef, Trazodone, Nefazodone, and sulfonamide derivatives.  She is on the following medications: alosetron, alprazolam, cyanocobalamin, duloxetine,  hyoscyamine, lidocaine, ondansetron, oxycodone, Nucynta, pantoprazole, Reclast, saccharomyces boulardii, and Unisom. She has had an appendectomy and tubal ligation.  FAMILY H ISTORY: Positive for heart disease and arthritis.  SOCIAL HISTORY: She smokes 1 PPD and is advised to quit and the reasons behind this. She drinks socially. 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  REVIEW OF SYSTEMS: Positive for loss of appetite, glasses, high BP, stomach ulcer, blood in her urine, balance problems, nervousness, sleep disorder, easy bruising and anemia.  Denise King is an 63 y.o. female.   Chief Complaint: CTS right CMC arthritis left thumb HPI: see above  Past Medical History  Diagnosis Date  . Unspecified chronic bronchitis   . Tobacco use disorder   . Reflux esophagitis   . Diverticulosis of colon   . Irritable bowel syndrome   . Hx of colonic polyps   . Acute cystitis   . Back pain   . Chronic pain syndrome   . Osteopenia   . Vertigo   . Dysthymia   . Depression   . Vitamin B12 deficiency   . Allergy     as a child grew out of them  . Anxiety   . Arthritis   . Blood transfusion   . Hypertension   . Osteoporosis   . Gastritis   . GERD (gastroesophageal reflux disease)   . Anemia     pernicious anemia  . H/O chest pain Dec. 2013    no work up done  . Wears glasses     Past Surgical History  Procedure Laterality Date  . Appendectomy    .  Tubal ligation    . Back surgery  10-09    Dr. Patrice Paradise  . Tonsillectomy    . Laparoscopy N/A 02/21/2013    Procedure: LAPAROSCOPY OPERATIVE;  Surgeon: Margarette Asal, MD;  Location: [facility];  Service: Gynecology;  Laterality: N/A;  REQUESTING 5MM SCOPE WITH CAMERA  . Colonoscopy    . Upper gastrointestinal endoscopy      Family History  Problem Relation Age of Onset  . Colon cancer Father   . Prostate cancer Father   . Heart disease Mother     prev MVR, also has DJD  . Colon cancer Brother   . Skin cancer  Sister   . Esophageal cancer Neg Hx   . Rectal cancer Neg Hx   . Stomach cancer Neg Hx    Social History:  reports that she has been smoking Cigarettes.  She has a 30 pack-year smoking history. She has never used smokeless tobacco. She reports that she drinks alcohol. She reports that she does not use illicit drugs.  Allergies:  Allergies  Allergen Reactions  . Atorvastatin Nausea And Vomiting  . Levofloxacin     Reaction: achilles tendon pain  . Omnicef [Cefdinir] Diarrhea    Uncontrollable diarrhea  . Trazodone And Nefazodone     "Felt like I was going to faint"  . Sulfonamide Derivatives Rash    Medications Prior to Admission  Medication Sig Dispense Refill  . Tapentadol HCl (NUCYNTA ER) 250 MG TB12 Take by mouth 2 (two) times daily.      Marland Kitchen alosetron (LOTRONEX) 0.5 MG tablet Take 1 tablet (0.5 mg total) by mouth 2 (two) times daily.  60 tablet  5  . ALPRAZolam (XANAX) 0.5 MG tablet Take 1/2 to 1 tablet by mouth three times daily as needed.  90 tablet  4  . cyanocobalamin (,VITAMIN B-12,) 1000 MCG/ML injection Inject 1,000 mcg into the muscle every 30 (thirty) days.      . Doxylamine Succinate, Sleep, (UNISOM) 25 MG tablet Take 25 mg by mouth at bedtime.       . DULoxetine (CYMBALTA) 60 MG capsule TAKE 1 CAPSULE EVERY DAY.  30 capsule  4  . hyoscyamine (LEVSIN SL) 0.125 MG SL tablet Place 1 tablet (0.125 mg total) under the tongue every 4 (four) hours as needed for cramping.  60 tablet  3  . lidocaine (LIDODERM) 5 % As needed      . ondansetron (ZOFRAN) 4 MG tablet TAKE  (1)  TABLET  EVERY FOUR HOURS AS NEEDED.  30 tablet  0  . oxyCODONE-acetaminophen (PERCOCET/ROXICET) 5-325 MG per tablet 1 every 4-6 hours as needed for pain      . pantoprazole (PROTONIX) 40 MG tablet Take 1 tablet (40 mg total) by mouth daily. 30 minutes  before  the first meal of the day  30 tablet  5  . saccharomyces boulardii (FLORASTOR) 250 MG capsule Take 1 capsule (250 mg total) by mouth 2 (two) times  daily.  63 capsule  0  . zoledronic acid (RECLAST) 5 MG/100ML SOLN Inject 5 mg into the vein once. Patient is due for one when she feels better        No results found for this or any previous visit (from the past 48 hour(s)).  No results found.   Pertinent items are noted in HPI.  Blood pressure 148/81, pulse 97, temperature 98 F (36.7 C), temperature source Oral, resp. rate 18, height 5\' 3"  (1.6 m), weight 55.792 kg (123 lb),  SpO2 99.00%.  General appearance: alert, cooperative and appears stated age Head: Normocephalic, without obvious abnormality Neck: no JVD Resp: clear to auscultation bilaterally Cardio: regular rate and rhythm, S1, S2 normal, no murmur, click, rub or gallop GI: soft, non-tender; bowel sounds normal; no masses,  no organomegaly Extremities: arthritis theumbs CMC joints Pulses: 2+ and symmetric Skin: Skin color, texture, turgor normal. No rashes or lesions Neurologic: Grossly normal Incision/Wound: na  Assessment/Plan Diagnosis: (1) Degenerative arthritis and carpal tunnel syndrome right The pre, peri and postoperative course were discussed along with the risks and complications.  The patient is aware there is no guarantee with the surgery, possibility of infection, recurrence, injury to arteries, nerves, tendons, incomplete relief of symptoms and dystrophy.  She would like to proceed.  She is scheduled for right carpal tunnel release as an outpatient under regional anesthesia.  Shamell Hittle R 08/14/2014, 7:39 AM

## 2014-08-14 NOTE — Op Note (Signed)
Dictation Number [id number]

## 2014-08-14 NOTE — Transfer of Care (Signed)
Immediate Anesthesia Transfer of Care Note  Patient: Denise King  Procedure(s) Performed: Procedure(s): RIGHT CARPAL TUNNEL RELEASE AND INJECT LEFT THUMB (Right)  Patient Location: PACU  Anesthesia Type:General  Level of Consciousness: awake, alert  and oriented  Airway & Oxygen Therapy: Patient Spontanous Breathing and Patient connected to face mask oxygen  Post-op Assessment: Report given to PACU RN and Post -op Vital signs reviewed and stable  Post vital signs: Reviewed and stable  Complications: No apparent anesthesia complications

## 2014-08-14 NOTE — Discharge Instructions (Addendum)

## 2014-08-14 NOTE — Brief Op Note (Signed)
08/14/2014  9:09 AM  PATIENT:  Karie Mainland  63 y.o. female  PRE-OPERATIVE DIAGNOSIS:  RIGHT CARPAL TUNNEL SYNDROME, DEGENERATIVE JOINT DISEASE LEFT THUMB  POST-OPERATIVE DIAGNOSIS:  RIGHT CARPAL TUNNEL SYNDROME, DEGENERATIVE JOINT DISEASE LEFT THUMB  PROCEDURE:  Procedure(s): RIGHT CARPAL TUNNEL RELEASE AND INJECT LEFT THUMB (Right)  SURGEON:  Surgeon(s) and Role:    * Daryll Brod, MD - Primary  PHYSICIAN ASSISTANT:   ASSISTANTS: none   ANESTHESIA:   local and general  EBL:  Total I/O In: 1000 [I.V.:1000] Out: -   BLOOD ADMINISTERED:none  DRAINS: none   LOCAL MEDICATIONS USED:  BUPIVICAINE   SPECIMEN:  No Specimen  DISPOSITION OF SPECIMEN:  N/A  COUNTS:  YES  TOURNIQUET:   Total Tourniquet Time Documented: Upper Arm (Right) - 11 minutes Total: Upper Arm (Right) - 11 minutes   DICTATION: .Other Dictation: Dictation Number [id number]  PLAN OF CARE: Discharge to home after PACU  PATIENT DISPOSITION:  PACU - hemodynamically stable.

## 2014-08-14 NOTE — Op Note (Signed)
NAMECHASITTY, HEHL           ACCOUNT NO.:  [id number]  MEDICAL RECORD NO.:  [id number]  LOCATION:                                 FACILITY:  PHYSICIAN:  Daryll Brod, M.D.            DATE OF BIRTH:  DATE OF PROCEDURE:  08/14/2014 DATE OF DISCHARGE:                              OPERATIVE REPORT   PREOPERATIVE DIAGNOSES:  Carpal tunnel syndrome, carpometacarpal arthritis, carpal tunnel, right hand; carpometacarpal arthritis bilateral thumbs.  POSTOPERATIVE DIAGNOSES:  Carpal tunnel syndrome, carpometacarpal arthritis, carpal tunnel, right hand; carpometacarpal arthritis bilateral thumbs.  OPERATION:  Release of right carpal canal with injection of carpometacarpal thumb.  SURGEON:  Daryll Brod, M.D.  ANESTHESIA:  General with local infiltration.  ANESTHESIOLOGIST:  Lorrene Reid, M.D.  HISTORY:  The patient is a 63 year old female with a history of carpal tunnel syndrome.  Nerve conduction is positive, nonresponsive to conservative treatment with CMC arthritis bilaterally.  She has elected to undergo release of her carpal canal with injection to the [facility] joint of her left thumb.  Pre, peri, and postoperative course have been discussed along with risks and complications.  She is aware there is no guarantee with surgery, possibility of infection; recurrence of injury to arteries, nerves, tendons, incomplete relief of symptoms, and dystrophy.  In the preoperative area, the patient is seen, the extremity marked by both patient and surgeon.  Antibiotic given.  PROCEDURE IN DETAIL:  The patient was brought to the operating room. General anesthetic was carried out without difficulty under the direction Dr. Al Corpus.  She was prepped using ChloraPrep, supine position, right arm free.  A 3-minute dry time was allowed.  While this was occurring, the left thumb was injected after a sterile prep with Betadine and alcohol.  This was done with 1% Xylocaine without epinephrine and Celestone  approximately 0.5 mL 3/4 mL mixed of each was given without difficulty.  Following the time-out on her right side, the limb was exsanguinated with an Esmarch bandage.  Tourniquet placed high on the arm was inflated to 250 mmHg.  A longitudinal incision was made in the right palm, carried down through subcutaneous tissue.  Bleeders were electrocauterized.  Palmar fascia was split.  Superficial palmar arch identified.  The flexor tendon to the ring and little finger identified to the ulnar side of the median nerve.  Carpal retinaculum was incised with sharp dissection.  A right-angle and Sewall retractor were placed between skin and forearm fascia.  The fascia was released for approximately 2 cm proximal to the wrist crease under direct vision. Canal was explored.  Air compression to the nerve was apparent.  No further lesions were identified.  Motor branch entered into muscle.  The wound was copiously irrigated with saline.  The skin closed with interrupted 4-0 Vicryl Rapide sutures.  Local infiltration with 0.25% Marcaine without epinephrine was given, approximately 8 mL was used. Sterile compressive dressing with the fingers free was applied.  On deflation of the tourniquet, all fingers immediately pinked.  She was taken to the recovery room for observation in a satisfactory condition.  She will be discharged home to return to the [facility] in 1  week on Vicodin.          ______________________________ Daryll Brod, M.D.     GK/MEDQ  D:  08/14/2014  T:  08/14/2014  Job:  [id number]

## 2014-08-14 NOTE — Anesthesia Procedure Notes (Signed)
Procedure Name: LMA Insertion Performed by: Nyheem Binette W Pre-anesthesia Checklist: Patient identified, Timeout performed, Emergency Drugs available, Suction available and Patient being monitored Patient Re-evaluated:Patient Re-evaluated prior to inductionOxygen Delivery Method: Circle system utilized Preoxygenation: Pre-oxygenation with 100% oxygen Intubation Type: IV induction Ventilation: Mask ventilation without difficulty LMA: LMA inserted LMA Size: 4.0 Tube type: Oral Number of attempts: 1 Placement Confirmation: breath sounds checked- equal and bilateral and positive ETCO2 Tube secured with: Tape Dental Injury: Teeth and Oropharynx as per pre-operative assessment      

## 2014-08-15 ENCOUNTER — Encounter (HOSPITAL_BASED_OUTPATIENT_CLINIC_OR_DEPARTMENT_OTHER): Payer: Self-pay | Admitting: Orthopedic Surgery

## 2014-09-07 ENCOUNTER — Emergency Department (HOSPITAL_COMMUNITY): Payer: 59

## 2014-09-07 ENCOUNTER — Encounter (HOSPITAL_COMMUNITY): Payer: Self-pay | Admitting: Emergency Medicine

## 2014-09-07 ENCOUNTER — Emergency Department (HOSPITAL_COMMUNITY)
Admission: EM | Admit: 2014-09-07 | Discharge: 2014-09-08 | Disposition: A | Discharge status: Home / Self Care | Payer: 59 | Attending: Emergency Medicine | Admitting: Emergency Medicine

## 2014-09-07 DIAGNOSIS — K2990 Gastroduodenitis, unspecified, without bleeding: Principal | ICD-10-CM

## 2014-09-07 DIAGNOSIS — F3289 Other specified depressive episodes: Secondary | ICD-10-CM | POA: Diagnosis not present

## 2014-09-07 DIAGNOSIS — Z8669 Personal history of other diseases of the nervous system and sense organs: Secondary | ICD-10-CM | POA: Insufficient documentation

## 2014-09-07 DIAGNOSIS — F329 Major depressive disorder, single episode, unspecified: Secondary | ICD-10-CM | POA: Diagnosis not present

## 2014-09-07 DIAGNOSIS — Z862 Personal history of diseases of the blood and blood-forming organs and certain disorders involving the immune mechanism: Secondary | ICD-10-CM | POA: Diagnosis not present

## 2014-09-07 DIAGNOSIS — I10 Essential (primary) hypertension: Secondary | ICD-10-CM | POA: Insufficient documentation

## 2014-09-07 DIAGNOSIS — K2970 Gastritis, unspecified, without bleeding: Secondary | ICD-10-CM

## 2014-09-07 DIAGNOSIS — Z87448 Personal history of other diseases of urinary system: Secondary | ICD-10-CM | POA: Insufficient documentation

## 2014-09-07 DIAGNOSIS — Z8739 Personal history of other diseases of the musculoskeletal system and connective tissue: Secondary | ICD-10-CM | POA: Insufficient documentation

## 2014-09-07 DIAGNOSIS — R1110 Vomiting, unspecified: Secondary | ICD-10-CM | POA: Diagnosis present

## 2014-09-07 DIAGNOSIS — K589 Irritable bowel syndrome without diarrhea: Secondary | ICD-10-CM | POA: Insufficient documentation

## 2014-09-07 DIAGNOSIS — Z79899 Other long term (current) drug therapy: Secondary | ICD-10-CM | POA: Diagnosis not present

## 2014-09-07 DIAGNOSIS — Z9889 Other specified postprocedural states: Secondary | ICD-10-CM | POA: Diagnosis not present

## 2014-09-07 DIAGNOSIS — F17200 Nicotine dependence, unspecified, uncomplicated: Secondary | ICD-10-CM | POA: Insufficient documentation

## 2014-09-07 DIAGNOSIS — Z860100 Personal history of colon polyps, unspecified: Secondary | ICD-10-CM | POA: Insufficient documentation

## 2014-09-07 DIAGNOSIS — G8929 Other chronic pain: Secondary | ICD-10-CM | POA: Diagnosis not present

## 2014-09-07 DIAGNOSIS — F411 Generalized anxiety disorder: Secondary | ICD-10-CM | POA: Diagnosis not present

## 2014-09-07 DIAGNOSIS — Z8709 Personal history of other diseases of the respiratory system: Secondary | ICD-10-CM | POA: Insufficient documentation

## 2014-09-07 LAB — CBC WITH DIFFERENTIAL/PLATELET
Basophils Absolute: 0 10*3/uL (ref 0.0–0.1)
Basophils Relative: 0 % (ref 0–1)
Eosinophils Absolute: 0.1 10*3/uL (ref 0.0–0.7)
Eosinophils Relative: 1 % (ref 0–5)
HCT: 43.4 % (ref 36.0–46.0)
Hemoglobin: 14.8 g/dL (ref 12.0–15.0)
Lymphocytes Relative: 47 % — ABNORMAL HIGH (ref 12–46)
Lymphs Abs: 4 10*3/uL (ref 0.7–4.0)
MCH: 32.7 pg (ref 26.0–34.0)
MCHC: 34.1 g/dL (ref 30.0–36.0)
MCV: 96 fL (ref 78.0–100.0)
Monocytes Absolute: 0.7 10*3/uL (ref 0.1–1.0)
Monocytes Relative: 9 % (ref 3–12)
Neutro Abs: 3.6 10*3/uL (ref 1.7–7.7)
Neutrophils Relative %: 43 % (ref 43–77)
Platelets: 325 10*3/uL (ref 150–400)
RBC: 4.52 MIL/uL (ref 3.87–5.11)
RDW: 12.2 % (ref 11.5–15.5)
WBC: 8.5 10*3/uL (ref 4.0–10.5)

## 2014-09-07 LAB — COMPREHENSIVE METABOLIC PANEL
ALT: 10 U/L (ref 0–35)
AST: 19 U/L (ref 0–37)
Albumin: 4.1 g/dL (ref 3.5–5.2)
Alkaline Phosphatase: 80 U/L (ref 39–117)
Anion gap: 16 — ABNORMAL HIGH (ref 5–15)
BUN: 9 mg/dL (ref 6–23)
CO2: 27 mEq/L (ref 19–32)
Calcium: 9.6 mg/dL (ref 8.4–10.5)
Chloride: 93 mEq/L — ABNORMAL LOW (ref 96–112)
Creatinine, Ser: 0.51 mg/dL (ref 0.50–1.10)
GFR calc Af Amer: >90 mL/min (ref >90)
GFR calc non Af Amer: >90 mL/min (ref >90)
Glucose, Bld: 122 mg/dL — ABNORMAL HIGH (ref 70–99)
Potassium: 3.3 mEq/L — ABNORMAL LOW (ref 3.7–5.3)
Sodium: 136 mEq/L — ABNORMAL LOW (ref 137–147)
Total Bilirubin: 0.7 mg/dL (ref 0.3–1.2)
Total Protein: 7.7 g/dL (ref 6.0–8.3)

## 2014-09-07 LAB — LIPASE, BLOOD: Lipase: 23 U/L (ref 11–59)

## 2014-09-07 LAB — I-STAT TROPONIN, ED: Troponin i, poc: 0 ng/mL (ref 0.00–0.08)

## 2014-09-07 MED ORDER — SODIUM CHLORIDE 0.9 % IV BOLUS (SEPSIS)
2000.0000 mL | Freq: Once | INTRAVENOUS | Status: AC
  Administered 2014-09-07: 2000 mL via INTRAVENOUS

## 2014-09-07 MED ORDER — ONDANSETRON HCL 4 MG/2ML IJ SOLN
4.0000 mg | Freq: Once | INTRAMUSCULAR | Status: AC
  Administered 2014-09-07: 4 mg via INTRAVENOUS
  Filled 2014-09-07: qty 2

## 2014-09-07 MED ORDER — ONDANSETRON HCL 4 MG/2ML IJ SOLN
4.0000 mg | Freq: Once | INTRAMUSCULAR | Status: AC
  Administered 2014-09-08: 4 mg via INTRAVENOUS
  Filled 2014-09-07: qty 2

## 2014-09-07 NOTE — ED Provider Notes (Signed)
CSN: [id number]     Arrival date & time 09/07/14  2002 History   First MD Initiated Contact with Patient 09/07/14 2227     Chief Complaint  Patient presents with  . Emesis  . Chest Pain     (Consider location/radiation/quality/duration/timing/severity/associated sxs/prior Treatment) Patient is a 63 y.o. female presenting with vomiting.  Emesis Severity:  Moderate Duration:  1 day Timing:  Constant Quality:  Stomach contents Progression:  Unchanged Chronicity:  New Relieved by:  Nothing Worsened by:  Nothing tried Ineffective treatments:  None tried Associated symptoms: no abdominal pain, no chills, no cough, no diarrhea, no fever, no headaches and no sore throat     Past Medical History  Diagnosis Date  . Unspecified chronic bronchitis   . Tobacco use disorder   . Reflux esophagitis   . Diverticulosis of colon   . Irritable bowel syndrome   . Hx of colonic polyps   . Acute cystitis   . Back pain   . Chronic pain syndrome   . Osteopenia   . Vertigo   . Dysthymia   . Depression   . Vitamin B12 deficiency   . Allergy     as a child grew out of them  . Anxiety   . Arthritis   . Blood transfusion   . Hypertension   . Osteoporosis   . Gastritis   . GERD (gastroesophageal reflux disease)   . Anemia     pernicious anemia  . H/O chest pain Dec. 2013    no work up done  . Wears glasses    Past Surgical History  Procedure Laterality Date  . Appendectomy    . Tubal ligation    . Back surgery  10-09    Dr. Patrice Paradise  . Tonsillectomy    . Laparoscopy N/A 02/21/2013    Procedure: LAPAROSCOPY OPERATIVE;  Surgeon: Margarette Asal, MD;  Location: [facility];  Service: Gynecology;  Laterality: N/A;  REQUESTING 5MM SCOPE WITH CAMERA  . Colonoscopy    . Upper gastrointestinal endoscopy    . Carpal tunnel release Right 08/14/2014    Procedure: RIGHT CARPAL TUNNEL RELEASE AND INJECT LEFT THUMB;  Surgeon: Daryll Brod, MD;  Location: [facility];  Service: Orthopedics;   Laterality: Right;   Family History  Problem Relation Age of Onset  . Colon cancer Father   . Prostate cancer Father   . Heart disease Mother     prev MVR, also has DJD  . Colon cancer Brother   . Skin cancer Sister   . Esophageal cancer Neg Hx   . Rectal cancer Neg Hx   . Stomach cancer Neg Hx    History  Substance Use Topics  . Smoking status: Current Every Day Smoker -- 1.00 packs/day for 30 years    Types: Cigarettes  . Smokeless tobacco: Never Used  . Alcohol Use: Yes     Comment: occasional   OB History   Grav Para Term Preterm Abortions TAB SAB Ect Mult Living                 Review of Systems  Constitutional: Negative for fever and chills.  HENT: Negative for congestion, rhinorrhea and sore throat.   Eyes: Negative for photophobia and visual disturbance.  Respiratory: Negative for cough and shortness of breath.   Cardiovascular: Negative for chest pain and leg swelling.  Gastrointestinal: Positive for vomiting. Negative for nausea, abdominal pain, diarrhea and constipation.  Endocrine: Negative for polyphagia and polyuria.  Genitourinary: Negative for dysuria, flank pain, vaginal bleeding, vaginal discharge and enuresis.  Musculoskeletal: Negative for back pain and gait problem.  Skin: Negative for color change and rash.  Neurological: Negative for dizziness, syncope, light-headedness, numbness and headaches.  Hematological: Negative for adenopathy. Does not bruise/bleed easily.  All other systems reviewed and are negative.     Allergies  Atorvastatin; Levofloxacin; Omnicef; Trazodone and nefazodone; and Sulfonamide derivatives  Home Medications   Prior to Admission medications   Medication Sig Start Date End Date Taking? Authorizing Provider  alosetron (LOTRONEX) 0.5 MG tablet Take 0.5 mg by mouth 2 (two) times daily as needed (for diarrhea).   Yes Historical Provider, MD  ALPRAZolam Duanne Moron) 0.5 MG tablet Take 0.5 mg by mouth at bedtime.   Yes Historical  Provider, MD  cyanocobalamin (,VITAMIN B-12,) 1000 MCG/ML injection Inject 1,000 mcg into the muscle every 30 (thirty) days.   Yes Historical Provider, MD  DOXYCYCLINE PO Take 1 tablet by mouth 2 (two) times daily. For post carpal tunnel surgery. Unknown strength. 2 days left. 08/14/14 09/09/14 Yes Historical Provider, MD  Doxylamine Succinate, Sleep, (UNISOM) 25 MG tablet Take 25 mg by mouth at bedtime.    Yes Historical Provider, MD  DULoxetine (CYMBALTA) 60 MG capsule Take 60 mg by mouth daily.   Yes Historical Provider, MD  hyoscyamine (LEVSIN SL) 0.125 MG SL tablet Place 1 tablet (0.125 mg total) under the tongue every 4 (four) hours as needed for cramping. 01/10/14  Yes Tammy S Parrett, NP  ondansetron (ZOFRAN) 4 MG tablet Take 4 mg by mouth every 4 (four) hours as needed for nausea or vomiting.   Yes Historical Provider, MD  oxyCODONE-acetaminophen (PERCOCET/ROXICET) 5-325 MG per tablet Take 1 tablet by mouth every 4 (four) hours as needed for severe pain.   Yes Historical Provider, MD  Tapentadol HCl (NUCYNTA ER) 250 MG TB12 Take 1 tablet by mouth every 12 (twelve) hours.   Yes Historical Provider, MD  ondansetron (ZOFRAN ODT) 4 MG disintegrating tablet Take 1 tablet (4 mg total) by mouth every 8 (eight) hours as needed for nausea or vomiting. 09/08/14   Debby Freiberg, MD  promethazine (PHENERGAN) 25 MG suppository Place 1 suppository (25 mg total) rectally every 6 (six) hours as needed for nausea or vomiting. 7/67/34   Delora Fuel, MD  zoledronic acid (RECLAST) 5 MG/100ML SOLN Inject 5 mg into the vein once. Patient is due for one when she feels better    Historical Provider, MD   BP 129/70  Pulse 95  Temp(Src) 98.4 F (36.9 C) (Oral)  Resp 14  Ht 5\' 3"  (1.6 m)  Wt 120 lb (54.432 kg)  BMI 21.26 kg/m2  SpO2 95% Physical Exam  Vitals reviewed. Constitutional: She is oriented to person, place, and time. She appears well-developed and well-nourished.  HENT:  Head: Normocephalic and  atraumatic.  Right Ear: External ear normal.  Left Ear: External ear normal.  Eyes: Conjunctivae and EOM are normal. Pupils are equal, round, and reactive to light.  Neck: Normal range of motion. Neck supple.  Cardiovascular: Normal rate, regular rhythm, normal heart sounds and intact distal pulses.   Pulmonary/Chest: Effort normal and breath sounds normal.  Abdominal: Soft. Bowel sounds are normal. There is tenderness in the left upper quadrant.  Musculoskeletal: Normal range of motion.  Neurological: She is alert and oriented to person, place, and time.  Skin: Skin is warm and dry.    ED Course  Procedures (including critical care time) Labs Review Labs  Reviewed  CBC WITH DIFFERENTIAL - Abnormal; Notable for the following:    Lymphocytes Relative 47 (*)    All other components within normal limits  COMPREHENSIVE METABOLIC PANEL - Abnormal; Notable for the following:    Sodium 136 (*)    Potassium 3.3 (*)    Chloride 93 (*)    Glucose, Bld 122 (*)    Anion gap 16 (*)    All other components within normal limits  URINALYSIS, ROUTINE W REFLEX MICROSCOPIC - Abnormal; Notable for the following:    Ketones, ur 15 (*)    All other components within normal limits  LIPASE, BLOOD  I-STAT TROPOININ, ED    Imaging Review Dg Chest 2 View  09/07/2014   CLINICAL DATA:  Chest pain.  EXAM: CHEST  2 VIEW  COMPARISON:  08/15/2013.  FINDINGS: The cardiac silhouette, mediastinal and hilar contours are within normal limits and stable. The lungs are clear. No pleural effusion. The bony thorax is intact.  IMPRESSION: No acute cardiopulmonary findings.   Electronically Signed   By: Kalman Jewels M.D.   On: 09/07/2014 22:06     EKG Interpretation   Date/Time:  Friday September 07 2014 20:22:53 EDT Ventricular Rate:  95 PR Interval:  144 QRS Duration: 78 QT Interval:  380 QTC Calculation: 477 R Axis:   88 Text Interpretation:  Normal sinus rhythm Right atrial enlargement  Borderline ECG No  significant change was found Confirmed by Debby Freiberg [phone]) on 09/07/2014 10:49:48 PM      MDM   Final diagnoses:  Gastritis    63 y.o. female  with pertinent PMH of chronic back pain on nucyenta presents with vomiting and crampy abd pain x 1 day.  No fevers or diarrhea.  She presents today as she is intolerant of PO intake.  On arrival vitals and physical exam as above, isolated mild LUQ abd tenderness, no tenderness elsewhere, and pt feels as if she has felt this way in the past with all GI issues.  NS bolus x 2 l and zofran x 8 mg administered with relief of symptoms.  Also given dilaudid for back pain.  Labs and imaging as above reviewed. I reexamined her abd on multiple occasions and she was without focal tenderness on most of these examinations.  Her vitals were normal.  Unfortunately, she was unable to tolerate PO.  Nacl rate and reglan with benadryl ordered. Pt care to Dr. Roxanne Mins.    1. Gastritis         Debby Freiberg, MD 09/08/14 1452

## 2014-09-07 NOTE — ED Notes (Signed)
C/o vomiting x 24-36 hours with chills.  Reports L sided chest tightness x 2-3 hours and feeling like heart is racing.  Also reports sob.

## 2014-09-08 LAB — URINALYSIS, ROUTINE W REFLEX MICROSCOPIC
Bilirubin Urine: NEGATIVE
Glucose, UA: NEGATIVE mg/dL
Hgb urine dipstick: NEGATIVE
Ketones, ur: 15 mg/dL — AB
Leukocytes, UA: NEGATIVE
Nitrite: NEGATIVE
Protein, ur: NEGATIVE mg/dL
Specific Gravity, Urine: 1.008 (ref 1.005–1.030)
Urobilinogen, UA: 0.2 mg/dL (ref 0.0–1.0)
pH: 7 (ref 5.0–8.0)

## 2014-09-08 MED ORDER — SODIUM CHLORIDE 0.9 % IV BOLUS (SEPSIS)
1000.0000 mL | Freq: Once | INTRAVENOUS | Status: AC
  Administered 2014-09-08: 1000 mL via INTRAVENOUS

## 2014-09-08 MED ORDER — PROMETHAZINE HCL 25 MG RE SUPP: 25.0000 mg | Freq: Four times a day (QID) | RECTAL | Status: DC | PRN

## 2014-09-08 MED ORDER — DIPHENHYDRAMINE HCL 50 MG/ML IJ SOLN
25.0000 mg | Freq: Once | INTRAMUSCULAR | Status: AC
Start: 2014-09-08 — End: 2014-09-08
  Administered 2014-09-08: 25 mg via INTRAVENOUS
  Filled 2014-09-08: qty 1

## 2014-09-08 MED ORDER — HYDROMORPHONE HCL PF 1 MG/ML IJ SOLN
1.0000 mg | INTRAMUSCULAR | Status: DC | PRN
  Administered 2014-09-08: 1 mg via INTRAVENOUS
  Filled 2014-09-08: qty 1

## 2014-09-08 MED ORDER — SODIUM CHLORIDE 0.9 % IV SOLN
Freq: Once | INTRAVENOUS | Status: AC
  Administered 2014-09-08: 03:00:00 via INTRAVENOUS

## 2014-09-08 MED ORDER — HYDROMORPHONE HCL PF 1 MG/ML IJ SOLN
1.0000 mg | Freq: Once | INTRAMUSCULAR | Status: AC
  Administered 2014-09-08: 1 mg via INTRAVENOUS
  Filled 2014-09-08: qty 1

## 2014-09-08 MED ORDER — ONDANSETRON 4 MG PO TBDP: 4.0000 mg | ORAL_TABLET | Freq: Three times a day (TID) | ORAL | Status: DC | PRN

## 2014-09-08 MED ORDER — METOCLOPRAMIDE HCL 5 MG/ML IJ SOLN
10.0000 mg | Freq: Once | INTRAMUSCULAR | Status: AC
  Administered 2014-09-08: 10 mg via INTRAVENOUS
  Filled 2014-09-08: qty 2

## 2014-09-08 NOTE — ED Notes (Signed)
Husband is present at the bedside to drive the patient home.

## 2014-09-08 NOTE — Discharge Instructions (Signed)

## 2014-09-08 NOTE — ED Provider Notes (Signed)
Patient is examined and evaluated by Dr. Colin Rhein and diagnosed with gastritis. She was having difficulty with ongoing nausea and vomiting so was kept in the ED and given IV fluids. She states she is feeling better. She is taking oral fluids without additional vomiting. She is discharged with prescription for promethazine suppository.  Delora Fuel, MD 71/06/26 [id number]

## 2014-09-08 NOTE — ED Notes (Signed)
Patient requests ice water, reports no longer feeling nauseous. Will provide as fluid challenge.

## 2014-09-13 ENCOUNTER — Other Ambulatory Visit: Payer: Self-pay | Admitting: Pulmonary Disease

## 2014-09-17 ENCOUNTER — Telehealth: Payer: Self-pay | Admitting: Pulmonary Disease

## 2014-09-17 ENCOUNTER — Ambulatory Visit (INDEPENDENT_AMBULATORY_CARE_PROVIDER_SITE_OTHER): Payer: 59

## 2014-09-17 DIAGNOSIS — E538 Deficiency of other specified B group vitamins: Secondary | ICD-10-CM

## 2014-09-17 NOTE — Telephone Encounter (Signed)
I spoke with Leigh and she is calling in rx for the pt. Pt aware. [place] Bing, CMA

## 2014-09-17 NOTE — Telephone Encounter (Signed)
Pt calling needing to get prescript  Filled ASAP leaving going out of town called to [place] for [employer]

## 2014-09-17 NOTE — Telephone Encounter (Signed)
Pt calling again a/b prescript.Denise King

## 2014-09-21 MED ORDER — CYANOCOBALAMIN 1000 MCG/ML IJ SOLN
1000.0000 ug | Freq: Once | INTRAMUSCULAR | Status: AC
  Administered 2014-09-21: 1000 ug via INTRAMUSCULAR

## 2014-09-27 ENCOUNTER — Other Ambulatory Visit: Payer: Self-pay | Admitting: *Deleted

## 2014-09-27 MED ORDER — DULOXETINE HCL 60 MG PO CPEP: 60.0000 mg | ORAL_CAPSULE | Freq: Every day | ORAL | Status: DC

## 2014-10-17 ENCOUNTER — Ambulatory Visit: Payer: 59

## 2014-10-24 ENCOUNTER — Other Ambulatory Visit: Payer: Self-pay | Admitting: Pulmonary Disease

## 2014-11-08 ENCOUNTER — Ambulatory Visit (INDEPENDENT_AMBULATORY_CARE_PROVIDER_SITE_OTHER): Payer: 59

## 2014-11-08 ENCOUNTER — Ambulatory Visit (INDEPENDENT_AMBULATORY_CARE_PROVIDER_SITE_OTHER): Payer: 59 | Admitting: Gastroenterology

## 2014-11-08 ENCOUNTER — Other Ambulatory Visit: Payer: Self-pay

## 2014-11-08 ENCOUNTER — Other Ambulatory Visit: Payer: Self-pay | Admitting: Obstetrics and Gynecology

## 2014-11-08 ENCOUNTER — Encounter: Payer: Self-pay | Admitting: Gastroenterology

## 2014-11-08 VITALS — BP 100/68 | HR 88 | Ht 62.5 in | Wt 120.0 lb

## 2014-11-08 DIAGNOSIS — R112 Nausea with vomiting, unspecified: Secondary | ICD-10-CM

## 2014-11-08 DIAGNOSIS — E538 Deficiency of other specified B group vitamins: Secondary | ICD-10-CM

## 2014-11-08 DIAGNOSIS — R110 Nausea: Secondary | ICD-10-CM

## 2014-11-08 NOTE — Progress Notes (Signed)
      History of Present Illness:  [name] returned for evaluation of nausea and vomiting.  For the past 2-1/2 months she's been suffering from episodic episodes of severe, protracted nausea followed by multiple episodes of vomiting.  This may occur every other week lasting up to 6 hours at a time.  She denies abdominal pain except for soreness that she develops after protracted vomiting.  She began Nucynta at the time prior to onset of these symptoms.  She takes opiates for chronic back pain.    Review of Systems: Pertinent positive and negative review of systems were noted in the above HPI section. All other review of systems were otherwise negative.    Current Medications, Allergies, Past Medical History, Past Surgical History, Family History and Social History were reviewed in [facility] record  Vital signs were reviewed in today's medical record. Physical Exam: General: Well developed , well nourished, no acute distress Skin: anicteric Head: Normocephalic and atraumatic Eyes:  sclerae anicteric, EOMI Ears: Normal auditory acuity Mouth: No deformity or lesions Lungs: Clear throughout to auscultation Heart: Regular rate and rhythm; no murmurs, rubs or bruits Abdomen: Soft, non tender and non distended. No masses, hepatosplenomegaly or hernias noted. Normal Bowel sounds Rectal:deferred Musculoskeletal: Symmetrical with no gross deformities  Pulses:  Normal pulses noted Extremities: No clubbing, cyanosis, edema or deformities noted Neurological: Alert oriented x 4, grossly nonfocal Psychological:  Alert and cooperative. Normal mood and affect  See Assessment and Plan under Problem List

## 2014-11-08 NOTE — Assessment & Plan Note (Signed)
Two-month history of nausea and vomiting.  Symptoms seem to have started following education switched to [place].  I am suspicious that this problem is medication-related.  Recommendations #1  I have asked the patient to speak with her pain doctor, Dr. Hardin Negus, to see whether she can be switched to another narcotic at least temporarily.

## 2014-11-08 NOTE — Patient Instructions (Signed)
You will receive a FluShot today  Follow up as needed

## 2014-11-09 LAB — CYTOLOGY - PAP

## 2014-11-15 IMAGING — CT CT ABD-PELV W/ CM
1 of 3 series · 14 of 32 positions shown, 19 images · IV contrast (omnipaque)
Comparison: 03/31/2012

CLINICAL DATA: Status status post diagnostic laparoscopy on
[REDACTED], postop pain, dilatation of the left ureter on laparoscopic
exam, prior appendectomy

CT ABDOMEN AND PELVIS WITH CONTRAST
TECHNIQUE: Multidetector CT imaging of the abdomen and pelvis was
performed following the standard protocol during bolus
administration of intravenous contrast.
Contrast: 100mL OMNIPAQUE IOHEXOL 300 MG/ML  SOLN

[Series 2: routine abdomen/pelvis with · axial · 0.76mm/px · z∈[-451,-111]mm · 14 of 77 slices shown, 19 images]
[im 5/77  soft-tissue]
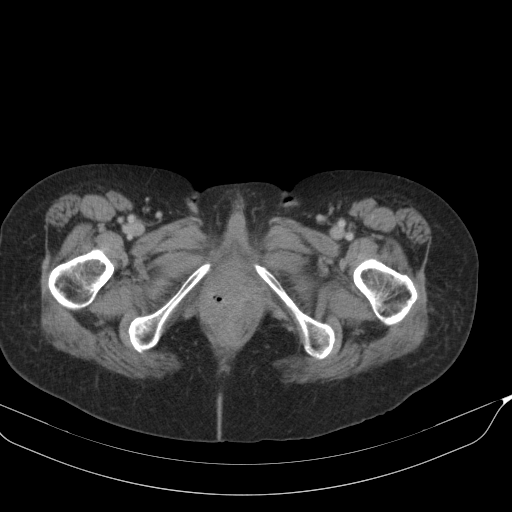
[im 5/77  bone]
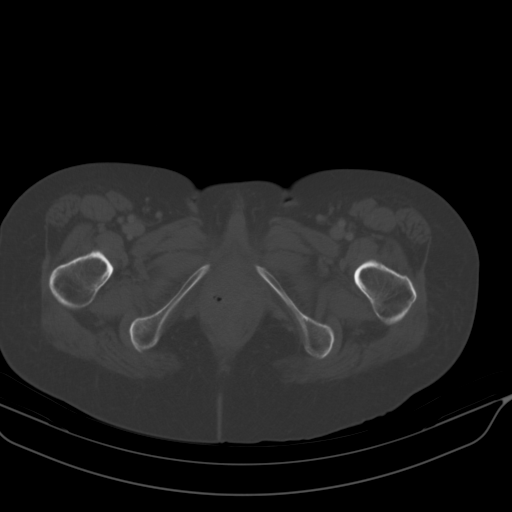
[im 13/77  soft-tissue]
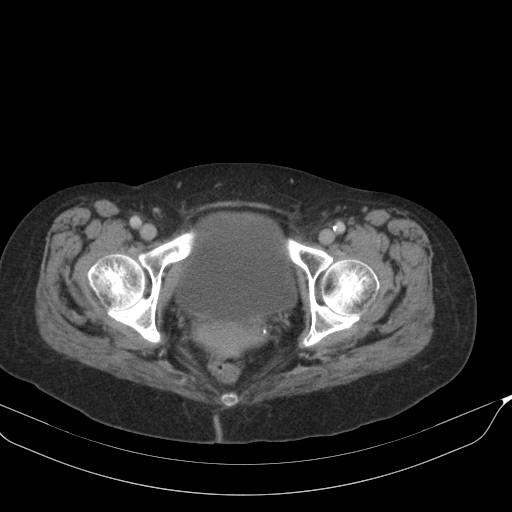
[im 17/77  soft-tissue]
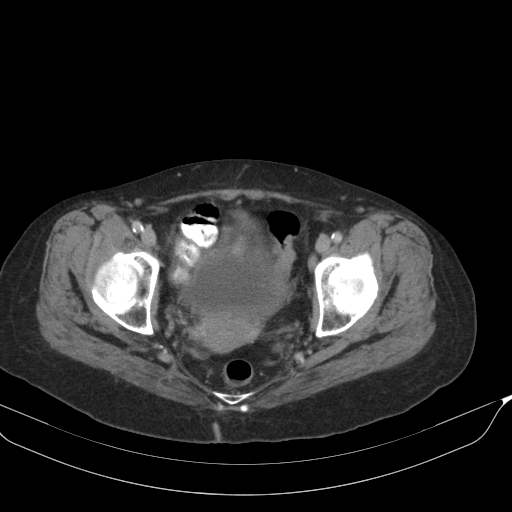
[im 21/77  soft-tissue]
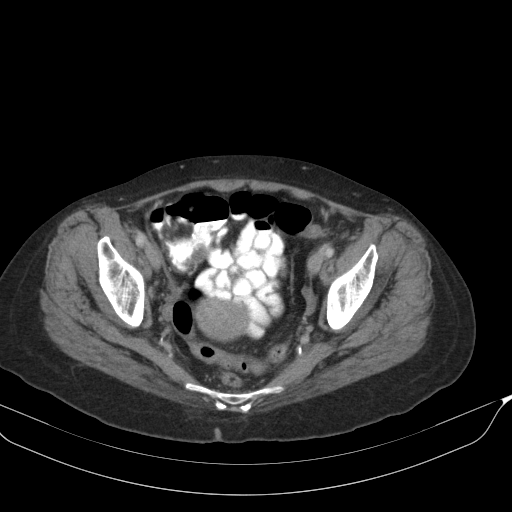
[im 29/77  soft-tissue]
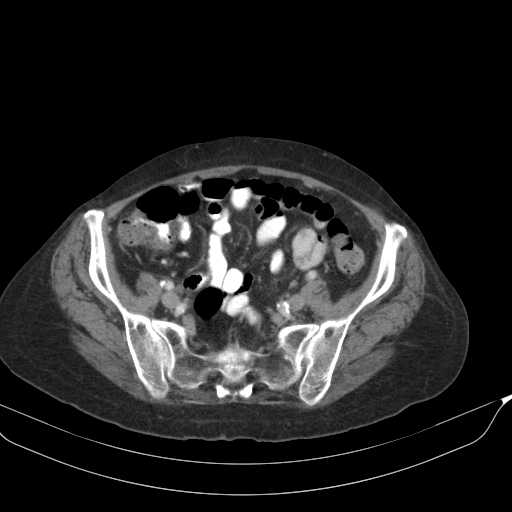
[im 33/77  soft-tissue]
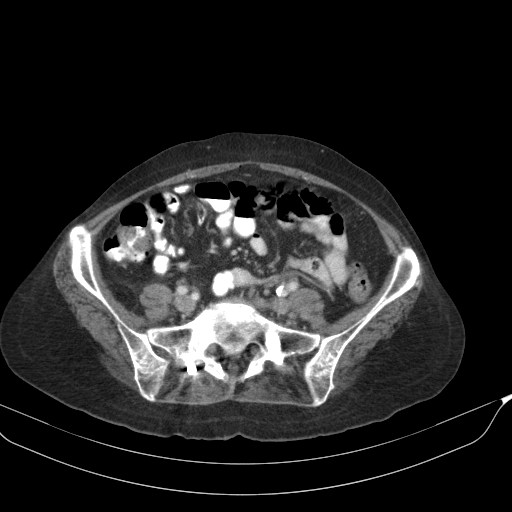
[im 41/77  soft-tissue]
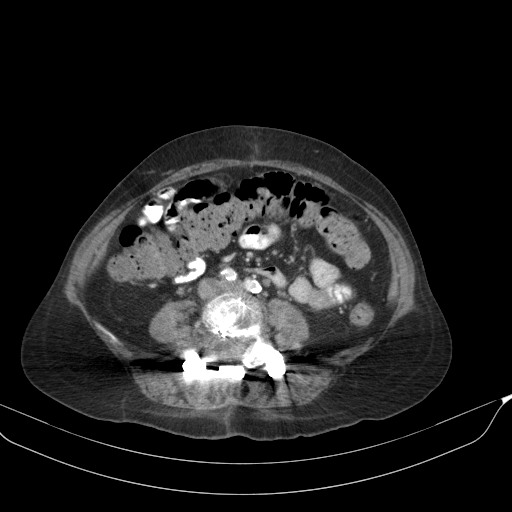
[im 45/77  soft-tissue]
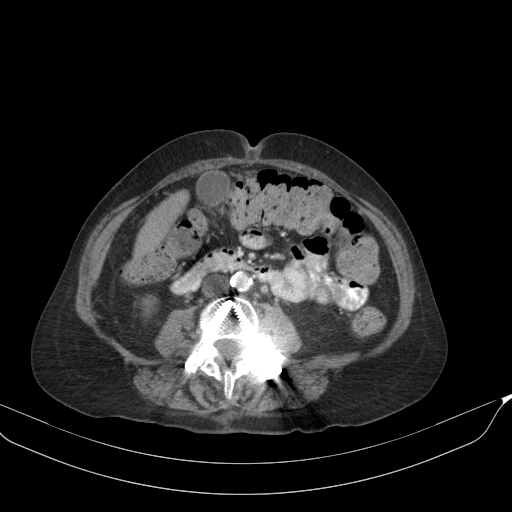
[im 49/77  soft-tissue]
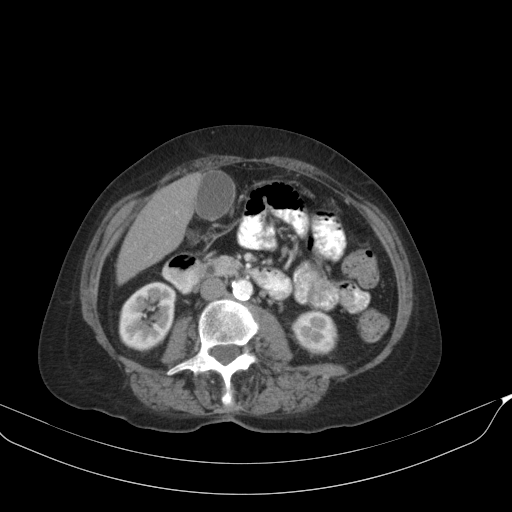
[im 49/77  bone]
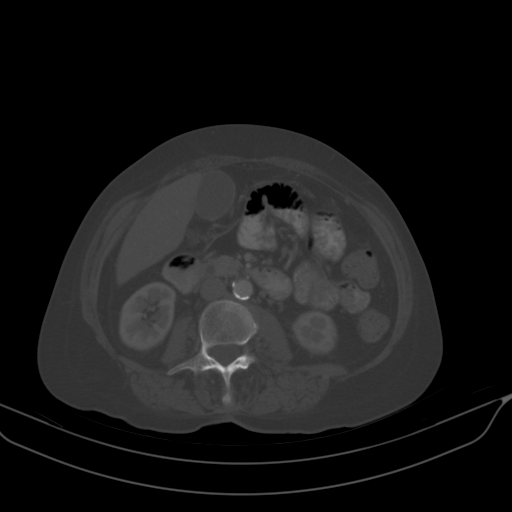
[im 57/77  soft-tissue]
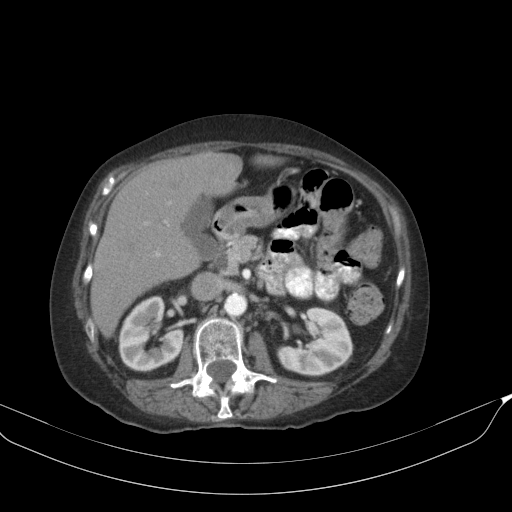
[im 61/77  soft-tissue]
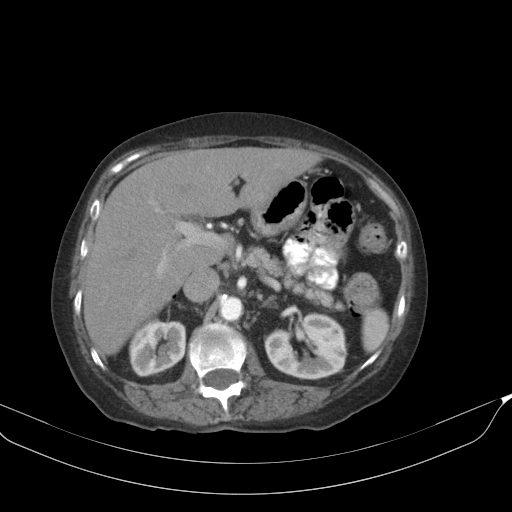
[im 61/77  lung]
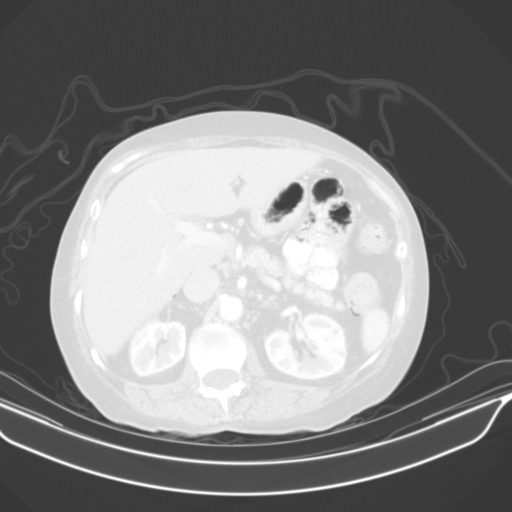
[im 65/77  soft-tissue]
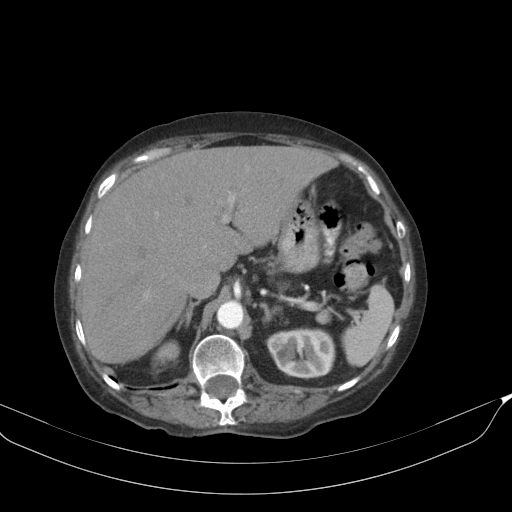
[im 65/77  lung]
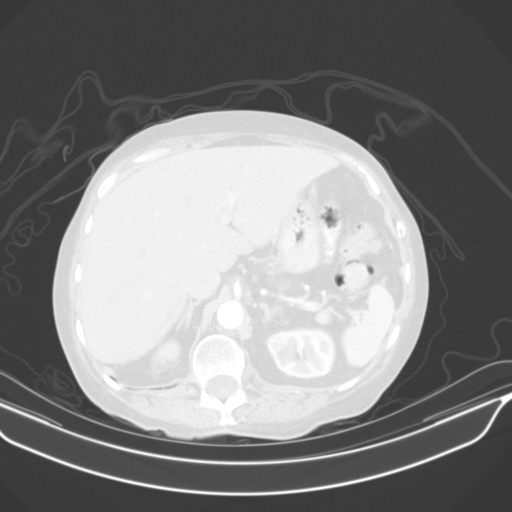
[im 69/77  lung]
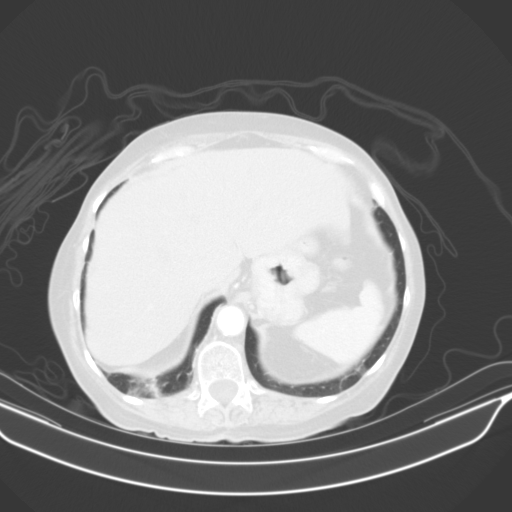
[im 73/77  soft-tissue]
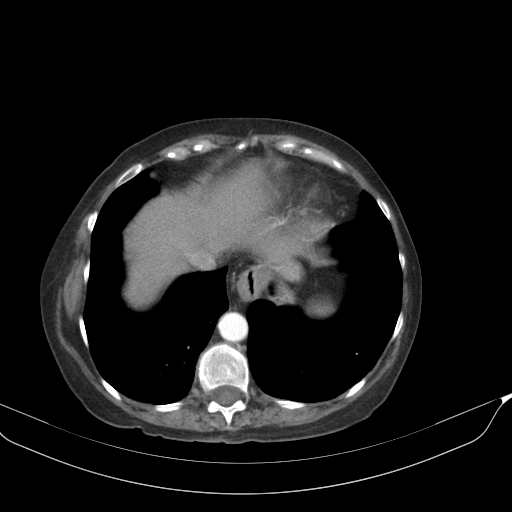
[im 73/77  lung]
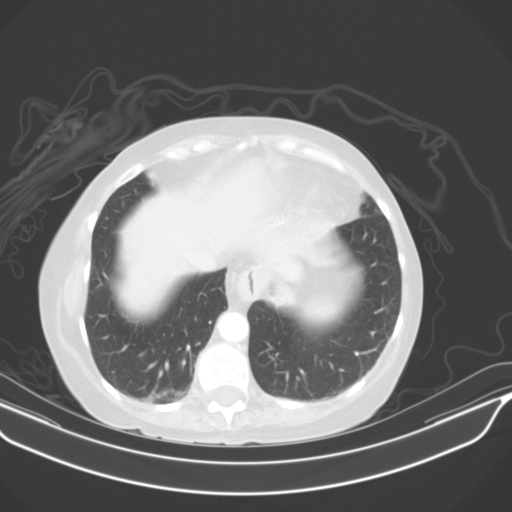

[14 of 32 positions shown; findings below may reference images not displayed]

FINDINGS: Mild scarring versus atelectasis at the right lung base.

Small hiatal hernia.

Increased subcapsular perfusion along the right hepatic lobe
(series 2/image 16), which normalizes on delayed imaging, favored
to reflect a perfusion anomaly.  Stable 6 mm cyst in the left
hepatic lobe (series 4/image 1).

Spleen, pancreas, and adrenal glands are within normal limits.

Gallbladder is unremarkable.  Dilated common duct, measuring up to
9 mm but tapering at the ampulla (coronal image 49), unchanged.

Scattered small bilateral renal cysts measuring up to 9 mm in the
right upper pole (series 4/image 7) and 8 mm in the left upper pole
(series 4/image 8).

No evidence of bowel obstruction.  Prior appendectomy.  Moderate
stool in the right/transverse colon.

Atherosclerotic calcifications of the abdominal aorta and branch
vessels.

No abdominopelvic ascites.

No suspicious abdominopelvic lymphadenopathy.

Uterus and bilateral ovaries are unremarkable.

No ureteral or bladder calculi.  Left ureter is nondilated.
Bladder is within normal limits.

Postsurgical changes in the anterior abdominal wall (series 2/image
40 one).

Status post PLIF from L4-S1.
IMPRESSION: No evidence of bowel obstruction.

Left ureter is nondilated.

Moderate stool in the right/transverse colon, suggesting
constipation.

## 2014-11-16 MED ORDER — CYANOCOBALAMIN 1000 MCG/ML IJ SOLN
1000.0000 ug | Freq: Once | INTRAMUSCULAR | Status: AC
  Administered 2014-11-08: 1000 ug via INTRAMUSCULAR

## 2014-11-23 ENCOUNTER — Other Ambulatory Visit (HOSPITAL_COMMUNITY): Payer: Self-pay | Admitting: *Deleted

## 2014-11-26 ENCOUNTER — Other Ambulatory Visit: Payer: Self-pay | Admitting: Pulmonary Disease

## 2014-11-26 ENCOUNTER — Ambulatory Visit (HOSPITAL_COMMUNITY)
Admission: RE | Admit: 2014-11-26 | Discharge: 2014-11-26 | Disposition: A | Discharge status: Home / Self Care | Payer: 59 | Source: Ambulatory Visit | Attending: Obstetrics and Gynecology | Admitting: Obstetrics and Gynecology

## 2014-11-26 DIAGNOSIS — M818 Other osteoporosis without current pathological fracture: Secondary | ICD-10-CM | POA: Diagnosis present

## 2014-11-26 DIAGNOSIS — Z5181 Encounter for therapeutic drug level monitoring: Secondary | ICD-10-CM | POA: Diagnosis not present

## 2014-11-26 MED ORDER — ZOLEDRONIC ACID 5 MG/100ML IV SOLN
5.0000 mg | Freq: Once | INTRAVENOUS | Status: AC
  Administered 2014-11-26: 5 mg via INTRAVENOUS

## 2014-11-26 MED ORDER — ZOLEDRONIC ACID 5 MG/100ML IV SOLN
INTRAVENOUS | Status: AC
  Administered 2014-11-26: 5 mg via INTRAVENOUS
  Filled 2014-11-26: qty 100

## 2014-12-10 ENCOUNTER — Ambulatory Visit: Payer: 59

## 2014-12-11 ENCOUNTER — Encounter: Payer: Self-pay | Admitting: Gastroenterology

## 2014-12-12 ENCOUNTER — Other Ambulatory Visit: Payer: Self-pay

## 2014-12-12 MED ORDER — ONDANSETRON HCL 4 MG PO TABS: ORAL_TABLET | ORAL | Status: DC

## 2014-12-14 ENCOUNTER — Telehealth: Payer: Self-pay | Admitting: Gastroenterology

## 2014-12-14 NOTE — Telephone Encounter (Signed)
Left message to call.

## 2014-12-14 NOTE — Telephone Encounter (Signed)
She is home after being in [place] with family. When there she had to go to the ER and receive fluids. She was put on Reglan and Carafate. She calls because she is still nauseous. States she is drinking water and can keep a peanut butter cracker and apple sauce down. She has a headache and feels very lethargic. She has not vomited today. Discussed taking zofran and how to re-introduce fluids by taking a swallow of broth, clear juice or other clear liquid every 30 minutes. Increase and advance as tolerated. Discussed symptoms of dehydration. Encouraged to return to the Er if she does not improve or acutely worsens. Call back on Monday with progress.

## 2014-12-16 NOTE — Telephone Encounter (Signed)
agree

## 2014-12-25 ENCOUNTER — Telehealth: Payer: Self-pay | Admitting: Pulmonary Disease

## 2014-12-25 NOTE — Telephone Encounter (Signed)
lmtcb for pt.   Per Leigh-ok to book pt on SN's schedule.

## 2014-12-25 NOTE — Telephone Encounter (Signed)
Appointment Request From: Karie Mainland      With Provider: Noralee Space, MD [-Primary Care Physician-]      Preferred Date Range: From 12/14/2014 To 12/18/2014      Preferred Times: Monday Afternoon, Tuesday Afternoon, Wednesday Afternoon, Thursday Afternoon, Friday Afternoon      Reason for visit: Office Visit      Comments:   I continue to have recurring nauseousness, frequent vomiting and stomach pain.

## 2014-12-26 NOTE — Telephone Encounter (Signed)
Called and spoke with pt and offered to put the referral in for her to get in with Dr. Deatra Ina per her insurance request.   She stated that she will call and try and get an appt with Dr. Doug Sou so they can refer her to GI.  Pt is to call back if anything further is needed.

## 2014-12-31 ENCOUNTER — Ambulatory Visit (INDEPENDENT_AMBULATORY_CARE_PROVIDER_SITE_OTHER): Payer: 59

## 2014-12-31 DIAGNOSIS — E538 Deficiency of other specified B group vitamins: Secondary | ICD-10-CM

## 2015-01-02 DIAGNOSIS — E538 Deficiency of other specified B group vitamins: Secondary | ICD-10-CM

## 2015-01-02 MED ORDER — CYANOCOBALAMIN 1000 MCG/ML IJ SOLN
1000.0000 ug | Freq: Once | INTRAMUSCULAR | Status: AC
  Administered 2015-01-02: 1000 ug via INTRAMUSCULAR

## 2015-01-14 ENCOUNTER — Encounter: Payer: Self-pay | Admitting: Family

## 2015-01-14 ENCOUNTER — Ambulatory Visit (INDEPENDENT_AMBULATORY_CARE_PROVIDER_SITE_OTHER): Payer: 59 | Admitting: Family

## 2015-01-14 ENCOUNTER — Telehealth: Payer: Self-pay | Admitting: Family

## 2015-01-14 VITALS — BP 150/84 | HR 93 | Temp 98.0°F | Resp 18 | Ht 63.0 in | Wt 112.0 lb

## 2015-01-14 DIAGNOSIS — F341 Dysthymic disorder: Secondary | ICD-10-CM

## 2015-01-14 DIAGNOSIS — M5440 Lumbago with sciatica, unspecified side: Secondary | ICD-10-CM

## 2015-01-14 MED ORDER — DULOXETINE HCL 30 MG PO CPEP: 30.0000 mg | ORAL_CAPSULE | Freq: Every day | ORAL | Status: DC

## 2015-01-14 NOTE — Progress Notes (Signed)
Subjective:    Patient ID: Denise King, female    DOB: 1951-09-12, 64 y.o.   MRN: [id number]  Chief Complaint  Patient presents with  . Establish Care    wants to change from cymbalta to abilify, has been on cymbalta so long she doesn't think its working anymore    HPI:  Denise King is a 64 y.o. female who presents today to establish care and discuss her depression medications.     1) Depression -previously prescribed cymbalta to help with the pain and depression. Has experienced an increase in the last 3-4 months. Denies any thoughts of suicide. Questioning possible addition of or change of medication to Abilify.   2) Back pain - chronic back pain from degenerative disc disease and had surgery in 2009. Associated symptoms currently experiencing are sharp midline lumbar spine with radiating pain into mainly her right leg. Currently attended the pain clinic, Dr. Hardin Negus who is managing.   3) GERD - stable on protonix   Allergies  Allergen Reactions  . Atorvastatin Nausea And Vomiting  . Levofloxacin     Reaction: achilles tendon pain  . Omnicef [Cefdinir] Diarrhea    Uncontrollable diarrhea  . Trazodone And Nefazodone     "Felt like I was going to faint"  . Sulfonamide Derivatives Rash    Current Outpatient Prescriptions on File Prior to Visit  Medication Sig Dispense Refill  . alosetron (LOTRONEX) 0.5 MG tablet Take 0.5 mg by mouth 2 (two) times daily as needed (for diarrhea).    . ALPRAZolam (XANAX) 0.5 MG tablet TAKE 1/2 TO 1 TABLET THREE TIMES DAILY AS NEEDED. 90 tablet 5  . cyanocobalamin (,VITAMIN B-12,) 1000 MCG/ML injection Inject 1,000 mcg into the muscle every 30 (thirty) days.    . Doxylamine Succinate, Sleep, (UNISOM) 25 MG tablet Take 25 mg by mouth at bedtime.     . DULoxetine (CYMBALTA) 60 MG capsule TAKE 1 CAPSULE DAILY. 30 capsule 3  . hyoscyamine (LEVSIN SL) 0.125 MG SL tablet Place 1 tablet (0.125 mg total) under the tongue every 4 (four)  hours as needed for cramping. 60 tablet 3  . ondansetron (ZOFRAN) 4 MG tablet 1 every 6 hours as needed for nausea 30 tablet 0  . oxyCODONE-acetaminophen (PERCOCET/ROXICET) 5-325 MG per tablet Take 1 tablet by mouth every 4 (four) hours as needed for severe pain.    . pantoprazole (PROTONIX) 40 MG tablet TAKE 1 TABLET 30 MINUTES BEFORE BREAKFAST 30 tablet 6  . promethazine (PHENERGAN) 25 MG suppository Place 1 suppository (25 mg total) rectally every 6 (six) hours as needed for nausea or vomiting. 12 suppository 0  . zoledronic acid (RECLAST) 5 MG/100ML SOLN Inject 5 mg into the vein once. Patient is due for one when she feels better     No current facility-administered medications on file prior to visit.    Past Medical History  Diagnosis Date  . Unspecified chronic bronchitis   . Tobacco use disorder   . Reflux esophagitis   . Diverticulosis of colon   . Irritable bowel syndrome   . Hx of colonic polyps   . Acute cystitis   . Back pain   . Chronic pain syndrome   . Osteopenia   . Vertigo   . Dysthymia   . Depression   . Vitamin B12 deficiency   . Allergy     as a child grew out of them  . Anxiety   . Arthritis   . Blood transfusion   .  Hypertension   . Osteoporosis   . Gastritis   . GERD (gastroesophageal reflux disease)   . Anemia     pernicious anemia  . H/O chest pain Dec. 2013    no work up done  . Wears glasses     Review of Systems  Musculoskeletal: Positive for back pain.  Neurological: Negative for numbness.  Psychiatric/Behavioral: Positive for dysphoric mood.      Objective:    BP 150/84 mmHg  Pulse 93  Temp(Src) 98 F (36.7 C) (Oral)  Resp 18  Ht 5\' 3"  (1.6 m)  Wt 112 lb (50.803 kg)  BMI 19.84 kg/m2  SpO2 97% Nursing note and vital signs reviewed.  Physical Exam  Constitutional: She is oriented to person, place, and time. She appears well-developed and well-nourished. No distress.  Cardiovascular: Normal rate, regular rhythm, normal heart  sounds and intact distal pulses.   Pulmonary/Chest: Effort normal and breath sounds normal.  Musculoskeletal:  No obvious deformity, discoloration or edema of lumbar spine noted. Palpable tenderness of thoracic spine around T8 and lower lumbar spine. Positive straight leg raise. Distal sensation and pulses are intact and appropriate.   Neurological: She is alert and oriented to person, place, and time.  Skin: Skin is warm and dry.  Psychiatric: She has a normal mood and affect. Her behavior is normal. Judgment and thought content normal.       Assessment & Plan:

## 2015-01-14 NOTE — Progress Notes (Signed)
Pre visit review using our clinic review tool, if applicable. No additional management support is needed unless otherwise documented below in the visit note. 

## 2015-01-14 NOTE — Telephone Encounter (Signed)
emmi mailed  °

## 2015-01-14 NOTE — Assessment & Plan Note (Signed)
Continues to experience dysthymia despite Cymbalta. Increase Cymbalta to 90 mg daily. Discussed importance of weaning off medication if changes need to be made. Discussed risks and benefits of Abilify and potential side effects. Patient will research medication. Follow up in about a month to determine effectiveness of dosage increase.

## 2015-01-14 NOTE — Patient Instructions (Signed)
Thank you for choosing [facility].  Summary/Instructions:  Your prescription(s) have been submitted to your pharmacy or been printed and provided for you. Please take as directed and contact our office if you believe you are having problem(s) with the medication(s) or have any questions.  If your symptoms worsen or fail to improve, please contact our office for further instruction, or in case of emergency go directly to the emergency room at the closest medical facility.    Aripiprazole tablets What is this medicine? ARIPIPRAZOLE (ay ri PIP ray zole) is an atypical antipsychotic. It is used to treat schizophrenia and bipolar disorder, also known as manic-depression. It is also used to treat Tourette's disorder and some symptoms of autism. This medicine may also be used in combination with antidepressants to treat major depressive disorder. This medicine may be used for other purposes; ask your health care provider or pharmacist if you have questions. COMMON BRAND NAME(S): Abilify What should I tell my health care provider before I take this medicine? They need to know if you have any of these conditions: -dehydration -dementia -diabetes -heart disease -history of stroke -low blood counts, like low white cell, platelet, or red cell counts -Parkinson's disease -seizures -suicidal thoughts, plans, or attempt; a previous suicide attempt by you or a family member -an unusual or allergic reaction to aripiprazole, other medicines, foods, dyes, or preservatives -pregnant or trying to get pregnant -breast-feeding How should I use this medicine? Take this medicine by mouth with a glass of water. Follow the directions on the prescription label. You can take this medicine with or without food. Take your doses at regular intervals. Do not take your medicine more often than directed. Do not stop taking except on the advice of your doctor or health care professional. A special MedGuide will be  given to you by the pharmacist with each prescription and refill. Be sure to read this information carefully each time. Talk to your pediatrician regarding the use of this medicine in children. While this drug may be prescribed for children as young as 56 years of age for selected conditions, precautions do apply. Overdosage: If you think you have taken too much of this medicine contact a poison control center or emergency room at once. NOTE: This medicine is only for you. Do not share this medicine with others. What if I miss a dose? If you miss a dose, take it as soon as you can. If it is almost time for your next dose, take only that dose. Do not take double or extra doses. What may interact with this medicine? Do not take this medicine with any of the following medications: -certain medicines for fungal infections like fluconazole, itraconazole, ketoconazole, posaconazole, voriconazole -cisapride -dofetilide -dronedarone -pimozide -thioridazine -ziprasidone This medicine may also interact with the following medications: -carbamazepine -certain medicines for blood pressure -erythromycin -fluoxetine -grapefruit juice -other medicines that prolong the QT interval (cause an abnormal heart rhythm) -paroxetine -quinidine -valproic acid This list may not describe all possible interactions. Give your health care provider a list of all the medicines, herbs, non-prescription drugs, or dietary supplements you use. Also tell them if you smoke, drink alcohol, or use illegal drugs. Some items may interact with your medicine. What should I watch for while using this medicine? Visit your doctor or health care professional for regular checks on your progress. It may be several weeks before you see the full effects of this medicine. Do not suddenly stop taking this medicine. You may need to  gradually reduce the dose. Patients and their families should watch out for worsening depression or thoughts of  suicide. Also watch out for sudden changes in feelings such as feeling anxious, agitated, panicky, irritable, hostile, aggressive, impulsive, severely restless, overly excited and hyperactive, or not being able to sleep. If this happens, especially at the beginning of antidepressant treatment or after a change in dose, call your health care professional. Dennis Bast may get dizzy or drowsy. Do not drive, use machinery, or do anything that needs mental alertness until you know how this medicine affects you. Do not stand or sit up quickly, especially if you are an older patient. This reduces the risk of dizzy or fainting spells. Alcohol can increase dizziness and drowsiness. Avoid alcoholic drinks. This medicine can reduce the response of your body to heat or cold. Dress warm in cold weather and stay hydrated in hot weather. If possible, avoid extreme temperatures like saunas, hot tubs, very hot or cold showers, or activities that can cause dehydration such as vigorous exercise. This medicine may cause dry eyes and blurred vision. If you wear contact lenses you may feel some discomfort. Lubricating drops may help. See your eye doctor if the problem does not go away or is severe. If you notice an increased hunger or thirst, different from your normal hunger or thirst, or if you find that you have to urinate more frequently, you should contact your health care provider as soon as possible. You may need to have your blood sugar monitored. This medicine may cause changes in your blood sugar levels. You should monitor you blood sugar frequently if you have diabetes. What side effects may I notice from receiving this medicine? Side effects that you should report to your doctor or health care professional as soon as possible: -allergic reactions like skin rash, itching or hives, swelling of the face, lips, or tongue -breathing problems -confusion -feeling faint or lightheaded, falls -fever or chills, sore  throat -increased hunger or thirst -increased urination -joint pain -muscles pain, spasms -problems with balance, talking, walking -restlessness or need to keep moving -seizures -suicidal thoughts or other mood changes -trouble swallowing -uncontrollable head, mouth, neck, arm, or leg movements -unusually weak or tired Side effects that usually do not require medical attention (report to your doctor or health care professional if they continue or are bothersome): -blurred vision -constipation -headache -nausea, vomiting -trouble sleeping -weight gain This list may not describe all possible side effects. Call your doctor for medical advice about side effects. You may report side effects to FDA at 1-800-FDA-1088. Where should I keep my medicine? Keep out of the reach of children. Store at room temperature between 15 and 30 degrees C (59 and 86 degrees F). Throw away any unused medicine after the expiration date. NOTE: This sheet is a summary. It may not cover all possible information. If you have questions about this medicine, talk to your doctor, pharmacist, or health care provider.  2015, Elsevier/Gold Standard. (2014-01-10 12:40:07)

## 2015-01-14 NOTE — Assessment & Plan Note (Signed)
Chronic back pain that is currently being managed by pain management. Continue current percocet and opana as previously prescribed.

## 2015-01-28 ENCOUNTER — Telehealth: Payer: Self-pay | Admitting: Family

## 2015-01-28 NOTE — Telephone Encounter (Signed)
Pt request [facility] referral to pain clinic, Dr. Melvenia Needles, on 01/29/15 at 1:30pm.

## 2015-01-30 NOTE — Telephone Encounter (Signed)
Ref # [id number] valid 01/29/15-07/30/15 for 6 visits

## 2015-01-31 ENCOUNTER — Ambulatory Visit: Payer: Self-pay

## 2015-02-08 ENCOUNTER — Ambulatory Visit (INDEPENDENT_AMBULATORY_CARE_PROVIDER_SITE_OTHER): Payer: 59

## 2015-02-08 DIAGNOSIS — E538 Deficiency of other specified B group vitamins: Secondary | ICD-10-CM

## 2015-02-11 ENCOUNTER — Encounter: Payer: Self-pay | Admitting: Family

## 2015-02-11 ENCOUNTER — Ambulatory Visit (INDEPENDENT_AMBULATORY_CARE_PROVIDER_SITE_OTHER): Payer: 59 | Admitting: Family

## 2015-02-11 VITALS — BP 130/84 | HR 100 | Temp 98.1°F | Ht 63.0 in | Wt 119.0 lb

## 2015-02-11 DIAGNOSIS — F341 Dysthymic disorder: Secondary | ICD-10-CM

## 2015-02-11 MED ORDER — ARIPIPRAZOLE 2 MG PO TABS: 2.0000 mg | ORAL_TABLET | Freq: Every day | ORAL | Status: DC

## 2015-02-11 NOTE — Progress Notes (Signed)
   Subjective:    Patient ID: Denise King, female    DOB: June 26, 1951, 64 y.o.   MRN: [id number]   Chief Complaint  Patient presents with  . Depression    HPI:  Denise King is a 64 y.o. female who presents today for follow up of her depression.    This is a chronic problem. She was previously taking 60 mg of Cymbalta and was increased to 90 mg as she felt her previous dose was not working. Indicates that she has felt a slight increase in her energy and mood, however believes there is still more improvements that are needed. She continues to experience the associated depression.    Allergies  Allergen Reactions  . Atorvastatin Nausea And Vomiting  . Levofloxacin     Reaction: achilles tendon pain  . Omnicef [Cefdinir] Diarrhea    Uncontrollable diarrhea  . Trazodone And Nefazodone     "Felt like I was going to faint"  . Sulfonamide Derivatives Rash    Current Outpatient Prescriptions on File Prior to Visit  Medication Sig Dispense Refill  . alosetron (LOTRONEX) 0.5 MG tablet Take 0.5 mg by mouth 2 (two) times daily as needed (for diarrhea).    . ALPRAZolam (XANAX) 0.5 MG tablet TAKE 1/2 TO 1 TABLET THREE TIMES DAILY AS NEEDED. 90 tablet 5  . cyanocobalamin (,VITAMIN B-12,) 1000 MCG/ML injection Inject 1,000 mcg into the muscle every 30 (thirty) days.    . Doxylamine Succinate, Sleep, (UNISOM) 25 MG tablet Take 25 mg by mouth at bedtime.     . DULoxetine (CYMBALTA) 30 MG capsule Take 1 capsule (30 mg total) by mouth daily. 30 capsule 3  . DULoxetine (CYMBALTA) 60 MG capsule TAKE 1 CAPSULE DAILY. 30 capsule 3  . hyoscyamine (LEVSIN SL) 0.125 MG SL tablet Place 1 tablet (0.125 mg total) under the tongue every 4 (four) hours as needed for cramping. 60 tablet 3  . ondansetron (ZOFRAN) 4 MG tablet 1 every 6 hours as needed for nausea 30 tablet 0  . pantoprazole (PROTONIX) 40 MG tablet TAKE 1 TABLET 30 MINUTES BEFORE BREAKFAST 30 tablet 6  . promethazine (PHENERGAN) 25  MG suppository Place 1 suppository (25 mg total) rectally every 6 (six) hours as needed for nausea or vomiting. 12 suppository 0  . zoledronic acid (RECLAST) 5 MG/100ML SOLN Inject 5 mg into the vein once. Patient is due for one when she feels better     No current facility-administered medications on file prior to visit.    Review of Systems  Psychiatric/Behavioral: Positive for dysphoric mood. Negative for suicidal ideas and sleep disturbance.      Objective:    BP 130/84 mmHg  Pulse 100  Temp(Src) 98.1 F (36.7 C) (Oral)  Ht 5\' 3"  (1.6 m)  Wt 119 lb (53.978 kg)  BMI 21.09 kg/m2  SpO2 96% Nursing note and vital signs reviewed.  Physical Exam  Constitutional: She is oriented to person, place, and time. She appears well-developed and well-nourished. No distress.  Cardiovascular: Normal rate, regular rhythm, normal heart sounds and intact distal pulses.   Pulmonary/Chest: Effort normal and breath sounds normal.  Neurological: She is alert and oriented to person, place, and time.  Skin: Skin is warm and dry.  Psychiatric: Her behavior is normal. Judgment and thought content normal. She exhibits a depressed mood.       Assessment & Plan:

## 2015-02-11 NOTE — Progress Notes (Signed)
Pre visit review using our clinic review tool, if applicable. No additional management support is needed unless otherwise documented below in the visit note. 

## 2015-02-11 NOTE — Assessment & Plan Note (Signed)
Notes some improvement with dysthymia from the increase in Cymbalta, however indicates she still feels depressed. Discussed options, and patient would like to start Abilify. Start Abilify 2 mg daily. Patient instructed to stop medication and seek emergency care if thoughts of suicide increase or thought of suicide and plan developed. Follow-up via my chart in 2 weeks to determine effectiveness.

## 2015-02-11 NOTE — Patient Instructions (Addendum)
Thank you for choosing [facility].  Summary/Instructions:  Please send a note via MyChart in 2 weeks to update status.   If thoughts of suicide develop please seek emergency medical care.     Your prescription(s) have been submitted to your pharmacy or been printed and provided for you. Please take as directed and contact our office if you believe you are having problem(s) with the medication(s) or have any questions.  If your symptoms worsen or fail to improve, please contact our office for further instruction, or in case of emergency go directly to the emergency room at the closest medical facility.    Aripiprazole tablets What is this medicine? ARIPIPRAZOLE (ay ri PIP ray zole) is an atypical antipsychotic. It is used to treat schizophrenia and bipolar disorder, also known as manic-depression. It is also used to treat Tourette's disorder and some symptoms of autism. This medicine may also be used in combination with antidepressants to treat major depressive disorder. This medicine may be used for other purposes; ask your health care provider or pharmacist if you have questions. COMMON BRAND NAME(S): Abilify What should I tell my health care provider before I take this medicine? They need to know if you have any of these conditions: -dehydration -dementia -diabetes -heart disease -history of stroke -low blood counts, like low white cell, platelet, or red cell counts -Parkinson's disease -seizures -suicidal thoughts, plans, or attempt; a previous suicide attempt by you or a family member -an unusual or allergic reaction to aripiprazole, other medicines, foods, dyes, or preservatives -pregnant or trying to get pregnant -breast-feeding How should I use this medicine? Take this medicine by mouth with a glass of water. Follow the directions on the prescription label. You can take this medicine with or without food. Take your doses at regular intervals. Do not take your  medicine more often than directed. Do not stop taking except on the advice of your doctor or health care professional. A special MedGuide will be given to you by the pharmacist with each prescription and refill. Be sure to read this information carefully each time. Talk to your pediatrician regarding the use of this medicine in children. While this drug may be prescribed for children as young as 60 years of age for selected conditions, precautions do apply. Overdosage: If you think you have taken too much of this medicine contact a poison control center or emergency room at once. NOTE: This medicine is only for you. Do not share this medicine with others. What if I miss a dose? If you miss a dose, take it as soon as you can. If it is almost time for your next dose, take only that dose. Do not take double or extra doses. What may interact with this medicine? Do not take this medicine with any of the following medications: -certain medicines for fungal infections like fluconazole, itraconazole, ketoconazole, posaconazole, voriconazole -cisapride -dofetilide -dronedarone -pimozide -thioridazine -ziprasidone This medicine may also interact with the following medications: -carbamazepine -certain medicines for blood pressure -erythromycin -fluoxetine -grapefruit juice -other medicines that prolong the QT interval (cause an abnormal heart rhythm) -paroxetine -quinidine -valproic acid This list may not describe all possible interactions. Give your health care provider a list of all the medicines, herbs, non-prescription drugs, or dietary supplements you use. Also tell them if you smoke, drink alcohol, or use illegal drugs. Some items may interact with your medicine. What should I watch for while using this medicine? Visit your doctor or health care professional for regular checks  on your progress. It may be several weeks before you see the full effects of this medicine. Do not suddenly stop taking  this medicine. You may need to gradually reduce the dose. Patients and their families should watch out for worsening depression or thoughts of suicide. Also watch out for sudden changes in feelings such as feeling anxious, agitated, panicky, irritable, hostile, aggressive, impulsive, severely restless, overly excited and hyperactive, or not being able to sleep. If this happens, especially at the beginning of antidepressant treatment or after a change in dose, call your health care professional. Dennis Bast may get dizzy or drowsy. Do not drive, use machinery, or do anything that needs mental alertness until you know how this medicine affects you. Do not stand or sit up quickly, especially if you are an older patient. This reduces the risk of dizzy or fainting spells. Alcohol can increase dizziness and drowsiness. Avoid alcoholic drinks. This medicine can reduce the response of your body to heat or cold. Dress warm in cold weather and stay hydrated in hot weather. If possible, avoid extreme temperatures like saunas, hot tubs, very hot or cold showers, or activities that can cause dehydration such as vigorous exercise. This medicine may cause dry eyes and blurred vision. If you wear contact lenses you may feel some discomfort. Lubricating drops may help. See your eye doctor if the problem does not go away or is severe. If you notice an increased hunger or thirst, different from your normal hunger or thirst, or if you find that you have to urinate more frequently, you should contact your health care provider as soon as possible. You may need to have your blood sugar monitored. This medicine may cause changes in your blood sugar levels. You should monitor you blood sugar frequently if you have diabetes. What side effects may I notice from receiving this medicine? Side effects that you should report to your doctor or health care professional as soon as possible: -allergic reactions like skin rash, itching or hives,  swelling of the face, lips, or tongue -breathing problems -confusion -feeling faint or lightheaded, falls -fever or chills, sore throat -increased hunger or thirst -increased urination -joint pain -muscles pain, spasms -problems with balance, talking, walking -restlessness or need to keep moving -seizures -suicidal thoughts or other mood changes -trouble swallowing -uncontrollable head, mouth, neck, arm, or leg movements -unusually weak or tired Side effects that usually do not require medical attention (report to your doctor or health care professional if they continue or are bothersome): -blurred vision -constipation -headache -nausea, vomiting -trouble sleeping -weight gain This list may not describe all possible side effects. Call your doctor for medical advice about side effects. You may report side effects to FDA at 1-800-FDA-1088. Where should I keep my medicine? Keep out of the reach of children. Store at room temperature between 15 and 30 degrees C (59 and 86 degrees F). Throw away any unused medicine after the expiration date. NOTE: This sheet is a summary. It may not cover all possible information. If you have questions about this medicine, talk to your doctor, pharmacist, or health care provider.  2015, Elsevier/Gold Standard. (2014-01-10 12:40:07)

## 2015-02-15 MED ORDER — CYANOCOBALAMIN 1000 MCG/ML IJ SOLN
1000.0000 ug | Freq: Once | INTRAMUSCULAR | Status: AC
  Administered 2015-02-08: 1000 ug via INTRAMUSCULAR

## 2015-03-08 ENCOUNTER — Ambulatory Visit: Payer: 59

## 2015-03-14 ENCOUNTER — Ambulatory Visit (INDEPENDENT_AMBULATORY_CARE_PROVIDER_SITE_OTHER): Payer: 59

## 2015-03-14 ENCOUNTER — Ambulatory Visit: Payer: 59

## 2015-03-14 DIAGNOSIS — E538 Deficiency of other specified B group vitamins: Secondary | ICD-10-CM

## 2015-03-20 MED ORDER — CYANOCOBALAMIN 1000 MCG/ML IJ SOLN
1000.0000 ug | Freq: Once | INTRAMUSCULAR | Status: AC
  Administered 2015-03-14: 1000 ug via INTRAMUSCULAR

## 2015-04-03 ENCOUNTER — Other Ambulatory Visit: Payer: Self-pay | Admitting: Pulmonary Disease

## 2015-04-15 ENCOUNTER — Ambulatory Visit (INDEPENDENT_AMBULATORY_CARE_PROVIDER_SITE_OTHER): Payer: 59

## 2015-04-15 DIAGNOSIS — E538 Deficiency of other specified B group vitamins: Secondary | ICD-10-CM | POA: Diagnosis not present

## 2015-04-16 MED ORDER — CYANOCOBALAMIN 1000 MCG/ML IJ SOLN
1000.0000 ug | Freq: Once | INTRAMUSCULAR | Status: AC
  Administered 2015-04-15: 1000 ug via INTRAMUSCULAR

## 2015-04-25 ENCOUNTER — Encounter: Payer: Self-pay | Admitting: Family

## 2015-04-25 ENCOUNTER — Ambulatory Visit (INDEPENDENT_AMBULATORY_CARE_PROVIDER_SITE_OTHER): Payer: 59 | Admitting: Family

## 2015-04-25 VITALS — Ht 63.0 in | Wt 117.8 lb

## 2015-04-25 DIAGNOSIS — F341 Dysthymic disorder: Secondary | ICD-10-CM | POA: Diagnosis not present

## 2015-04-25 DIAGNOSIS — R350 Frequency of micturition: Secondary | ICD-10-CM | POA: Insufficient documentation

## 2015-04-25 LAB — POCT URINALYSIS DIPSTICK
Bilirubin, UA: NEGATIVE
Glucose, UA: NEGATIVE
Ketones, UA: NEGATIVE
Leukocytes, UA: NEGATIVE
Nitrite, UA: NEGATIVE
Protein, UA: NEGATIVE
Spec Grav, UA: 1.015
Urobilinogen, UA: NEGATIVE
pH, UA: 6

## 2015-04-25 MED ORDER — BUPROPION HCL ER (XL) 150 MG PO TB24
150.0000 mg | ORAL_TABLET | Freq: Every day | ORAL | Status: DC
Start: 2015-04-25 — End: 2015-05-24

## 2015-04-25 MED ORDER — NITROFURANTOIN MONOHYD MACRO 100 MG PO CAPS
100.0000 mg | ORAL_CAPSULE | Freq: Two times a day (BID) | ORAL | Status: DC
Start: 2015-04-25 — End: 2015-11-25

## 2015-04-25 MED ORDER — ALPRAZOLAM 0.5 MG PO TABS: ORAL_TABLET | ORAL | Status: DC

## 2015-04-25 NOTE — Assessment & Plan Note (Signed)
In office urinalysis negative for nitrites and leukocytes, and positive for hematuria. Start Macrodantin. Urine sent for culture. Patient started to drink plenty of fluids and follow urinary tract infection instructions. Follow-up if symptoms worsen or fail to improve.

## 2015-04-25 NOTE — Patient Instructions (Addendum)
Thank you for choosing [facility].  Summary/Instructions:  Your prescription(s) have been submitted to your pharmacy or been printed and provided for you. Please take as directed and contact our office if you believe you are having problem(s) with the medication(s) or have any questions.  If your symptoms worsen or fail to improve, please contact our office for further instruction, or in case of emergency go directly to the emergency room at the closest medical facility.   Urinary Tract Infection Urinary tract infections (UTIs) can develop anywhere along your urinary tract. Your urinary tract is your body's drainage system for removing wastes and extra water. Your urinary tract includes two kidneys, two ureters, a bladder, and a urethra. Your kidneys are a pair of bean-shaped organs. Each kidney is about the size of your fist. They are located below your ribs, one on each side of your spine. CAUSES Infections are caused by microbes, which are microscopic organisms, including fungi, viruses, and bacteria. These organisms are so small that they can only be seen through a microscope. Bacteria are the microbes that most commonly cause UTIs. SYMPTOMS  Symptoms of UTIs may vary by age and gender of the patient and by the location of the infection. Symptoms in young women typically include a frequent and intense urge to urinate and a painful, burning feeling in the bladder or urethra during urination. Older women and men are more likely to be tired, shaky, and weak and have muscle aches and abdominal pain. A fever may mean the infection is in your kidneys. Other symptoms of a kidney infection include pain in your back or sides below the ribs, nausea, and vomiting. DIAGNOSIS To diagnose a UTI, your caregiver will ask you about your symptoms. Your caregiver also will ask to provide a urine sample. The urine sample will be tested for bacteria and white blood cells. White blood cells are made by your  body to help fight infection. TREATMENT  Typically, UTIs can be treated with medication. Because most UTIs are caused by a bacterial infection, they usually can be treated with the use of antibiotics. The choice of antibiotic and length of treatment depend on your symptoms and the type of bacteria causing your infection. HOME CARE INSTRUCTIONS  If you were prescribed antibiotics, take them exactly as your caregiver instructs you. Finish the medication even if you feel better after you have only taken some of the medication.  Drink enough water and fluids to keep your urine clear or pale yellow.  Avoid caffeine, tea, and carbonated beverages. They tend to irritate your bladder.  Empty your bladder often. Avoid holding urine for long periods of time.  Empty your bladder before and after sexual intercourse.  After a bowel movement, women should cleanse from front to back. Use each tissue only once. SEEK MEDICAL CARE IF:   You have back pain.  You develop a fever.  Your symptoms do not begin to resolve within 3 days. SEEK IMMEDIATE MEDICAL CARE IF:   You have severe back pain or lower abdominal pain.  You develop chills.  You have nausea or vomiting.  You have continued burning or discomfort with urination. MAKE SURE YOU:   Understand these instructions.  Will watch your condition.  Will get help right away if you are not doing well or get worse. Document Released: 09/23/2005 Document Revised: 06/14/2012 Document Reviewed: 01/22/2012 Exit[employer] Patient Information 2015 [employer]. This information is not intended to replace advice given to you by your health care provider.   Make sure you discuss any questions you have with your health care provider.   

## 2015-04-25 NOTE — Progress Notes (Signed)
Pre visit review using our clinic review tool, if applicable. No additional management support is needed unless otherwise documented below in the visit note. 

## 2015-04-25 NOTE — Progress Notes (Signed)
Subjective:    Patient ID: Denise King, female    DOB: 12-May-1951, 64 y.o.   MRN: [id number]  Chief Complaint  Patient presents with  . Urinary Tract Infection    x1 week, urinary frequency, lower back discomfort, nausea, and fatigue, also needs refill on xanax    HPI:  Denise King is a 64 y.o. female with a PMH of GERD, dysthmia, and chronic pain syndrome who presents today for an acute office visit.  1) Urinary frequency - Associated symptom of urinary frequency and urgency has been going on for about 7 days. Indicates there is also some bilateral flank pain with right being worse than the left. Denies any fevers or chills.   2) Depression - previously maintained on Cymbalta. Abilify was added to her regimen at previous office visit. She since discontinued the medication secondary to how it made her feel. She notes that there are increased feelings of depression and stress secondary to family and health concerns. Denies any suicidal ideation.   Allergies  Allergen Reactions  . Atorvastatin Nausea And Vomiting  . Levofloxacin     Reaction: achilles tendon pain  . Omnicef [Cefdinir] Diarrhea    Uncontrollable diarrhea  . Trazodone And Nefazodone     "Felt like I was going to faint"  . Sulfonamide Derivatives Rash    Current Outpatient Prescriptions on File Prior to Visit  Medication Sig Dispense Refill  . alosetron (LOTRONEX) 0.5 MG tablet Take 0.5 mg by mouth 2 (two) times daily as needed (for diarrhea).    . ALPRAZolam (XANAX) 0.5 MG tablet TAKE 1/2 TO 1 TABLET THREE TIMES DAILY AS NEEDED. 90 tablet 5  . cyanocobalamin (,VITAMIN B-12,) 1000 MCG/ML injection Inject 1,000 mcg into the muscle every 30 (thirty) days.    . Doxylamine Succinate, Sleep, (UNISOM) 25 MG tablet Take 25 mg by mouth at bedtime.     . DULoxetine (CYMBALTA) 30 MG capsule Take 1 capsule (30 mg total) by mouth daily. 30 capsule 3  . DULoxetine (CYMBALTA) 60 MG capsule TAKE 1 CAPSULE DAILY.  30 capsule 1  . fentaNYL (DURAGESIC - DOSED MCG/HR) 25 MCG/HR patch Place 25 mcg onto the skin every 3 (three) days.    . hyoscyamine (LEVSIN SL) 0.125 MG SL tablet Place 1 tablet (0.125 mg total) under the tongue every 4 (four) hours as needed for cramping. 60 tablet 3  . ondansetron (ZOFRAN) 4 MG tablet 1 every 6 hours as needed for nausea 30 tablet 0  . oxyCODONE-acetaminophen (PERCOCET) 10-325 MG per tablet Take 1 tablet by mouth every 4 (four) hours as needed for pain.    . pantoprazole (PROTONIX) 40 MG tablet TAKE 1 TABLET 30 MINUTES BEFORE BREAKFAST 30 tablet 6  . promethazine (PHENERGAN) 25 MG suppository Place 1 suppository (25 mg total) rectally every 6 (six) hours as needed for nausea or vomiting. 12 suppository 0  . zoledronic acid (RECLAST) 5 MG/100ML SOLN Inject 5 mg into the vein once. Patient is due for one when she feels better     No current facility-administered medications on file prior to visit.    Review of Systems  Constitutional: Negative for fever and chills.  Genitourinary: Positive for urgency, frequency, hematuria and flank pain. Negative for dysuria.      Objective:    Ht 5\' 3"  (1.6 m)  Wt 117 lb 12.8 oz (53.434 kg)  BMI 20.87 kg/m2 88Nursing note and vital signs reviewed.  Physical Exam  Constitutional: She is oriented to  person, place, and time. She appears well-developed and well-nourished. No distress.  Cardiovascular: Normal rate, regular rhythm, normal heart sounds and intact distal pulses.   Pulmonary/Chest: Effort normal and breath sounds normal.  Abdominal: There is no CVA tenderness.  Neurological: She is alert and oriented to person, place, and time.  Skin: Skin is warm and dry.  Psychiatric: Judgment and thought content normal. She is withdrawn. She exhibits a depressed mood.       Assessment & Plan:

## 2015-04-25 NOTE — Assessment & Plan Note (Signed)
Worsened dysthymia with current medication. Patient self discontinued the Abilify secondary to side effects. Start Wellbutrin. Follow-up in one month or sooner if needed. Discussed with patient possible referral to psychiatry for further management, she is hesitant at this time.

## 2015-04-30 ENCOUNTER — Telehealth: Payer: Self-pay | Admitting: Family

## 2015-04-30 NOTE — Telephone Encounter (Signed)
States Denise King prescribed macrobid for her but it is not working.  She would like to know if she needs something else called in.  States pain in left side has not let up.  Patient uses [facility]

## 2015-05-01 MED ORDER — FOSFOMYCIN TROMETHAMINE 3 G PO PACK: 3.0000 g | PACK | Freq: Once | ORAL | Status: DC

## 2015-05-01 NOTE — Telephone Encounter (Signed)
New medication sent to pharmacy

## 2015-05-01 NOTE — Telephone Encounter (Signed)
Left vm

## 2015-05-06 IMAGING — CR DG CHEST 2V
2 series · 2 of 2 positions shown · non-contrast
Comparison: February 03, 2011

CLINICAL DATA: Chest pain

CHEST - 2 VIEW

[view not recorded (1 of 2)]
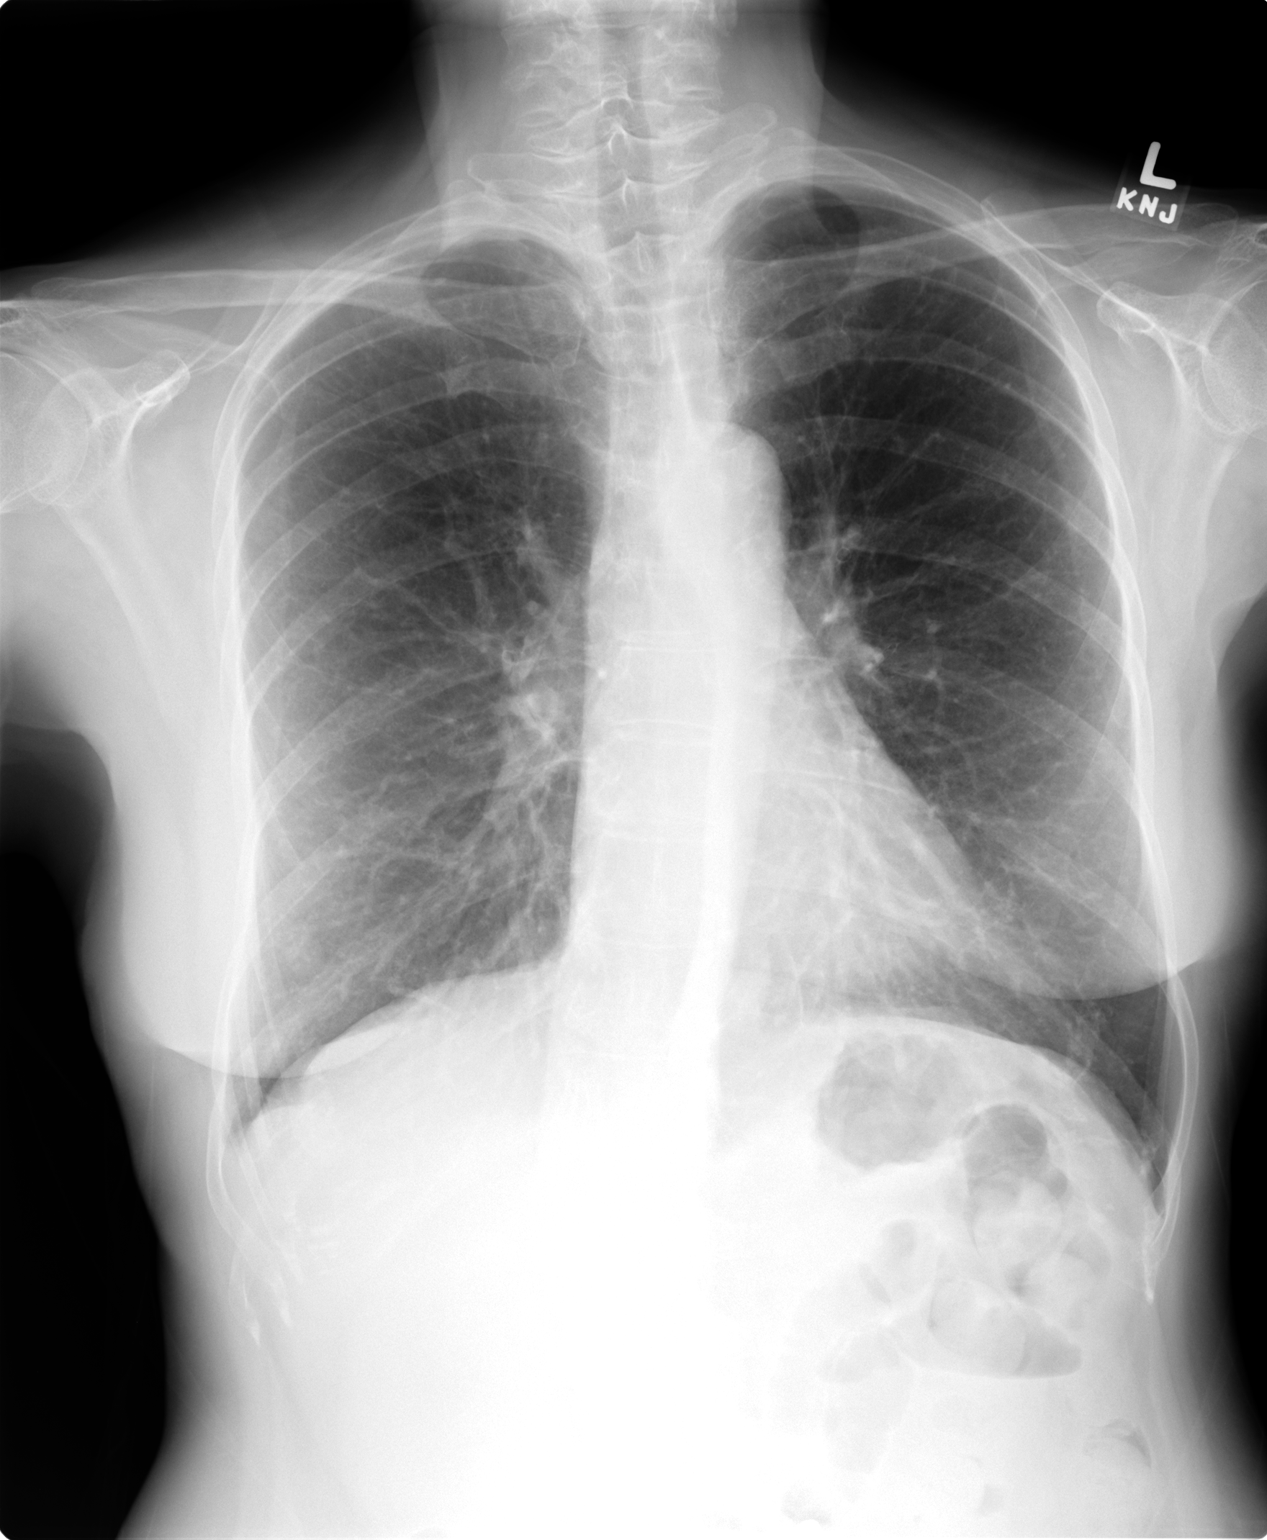

[view not recorded (2 of 2)]
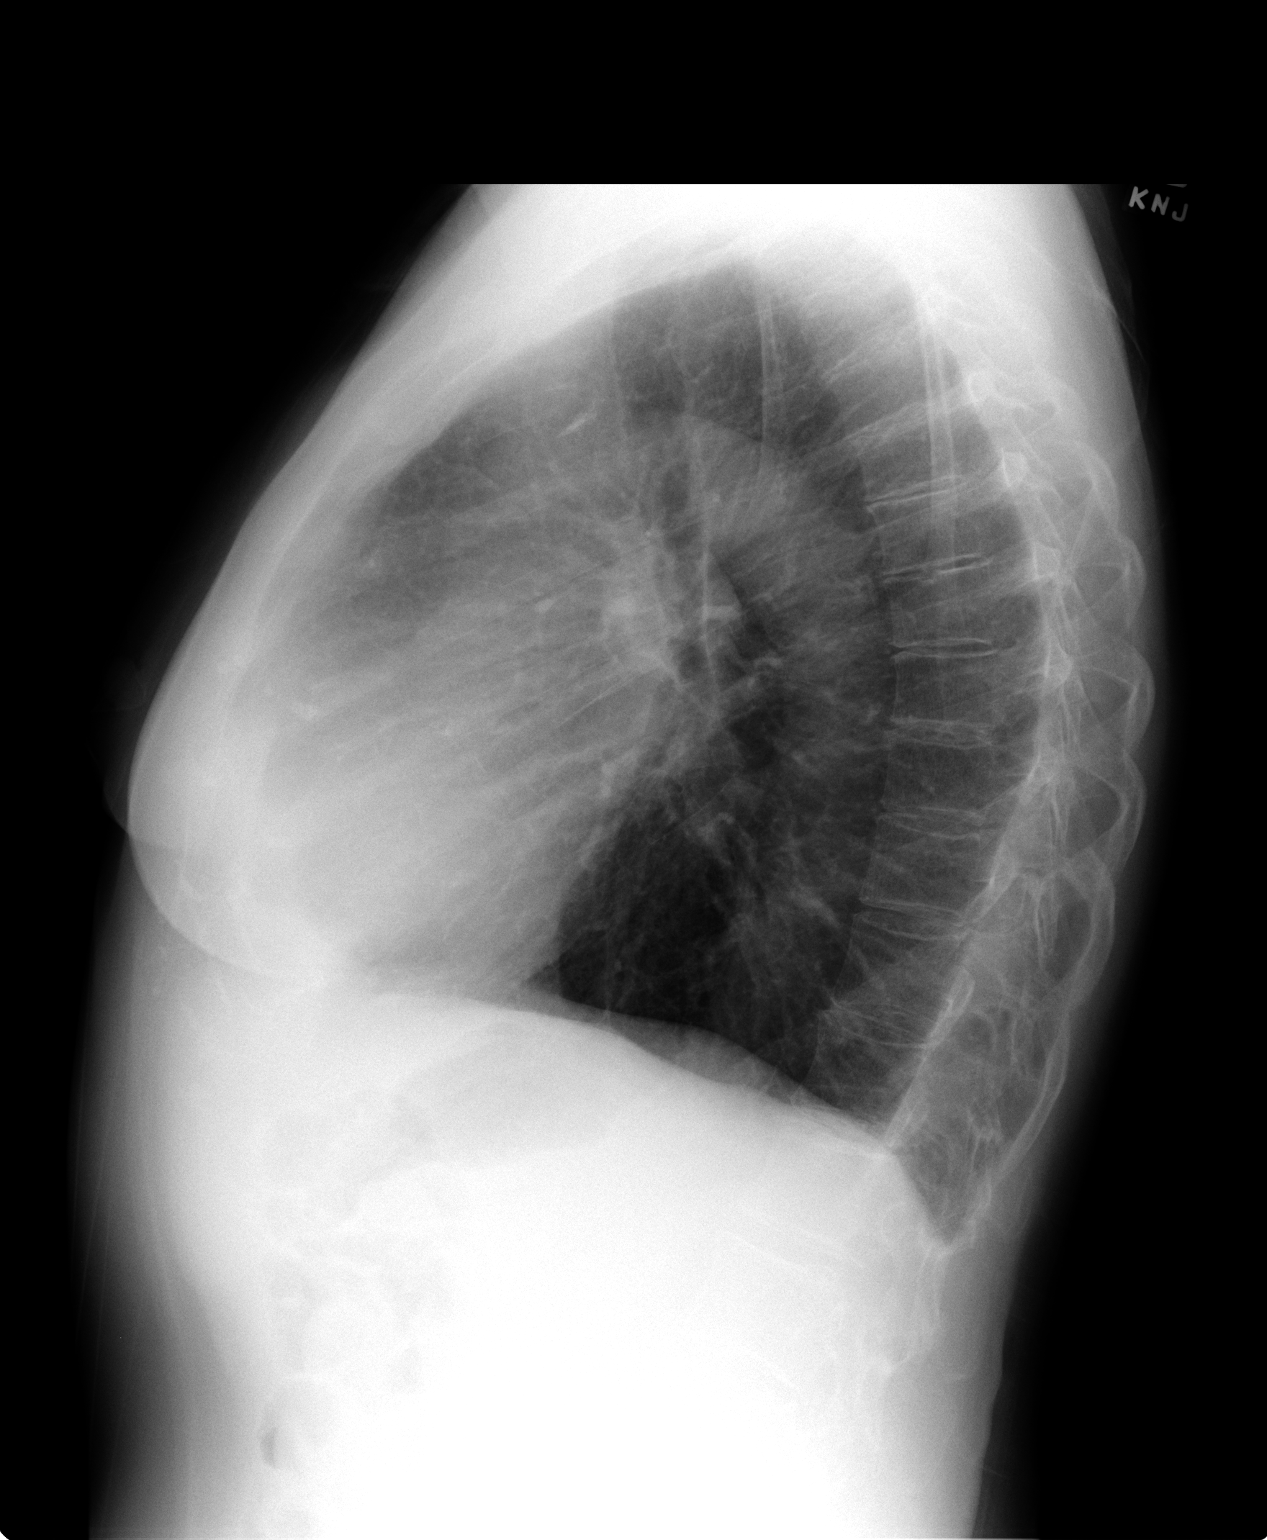

[2 of 2 positions shown; findings below may reference images not displayed]

FINDINGS: There is underlying emphysematous change.  There are
apparent nipple shadows bilaterally.  There is no edema or
consolidation.  The heart size is normal.  Pulmonary vascularity
reflects underlying emphysema. No adenopathy. No pneumothorax.

There is mid thoracic levoscoliosis with thoracolumbar
dextroscoliosis.
IMPRESSION: Underlying emphysema.  Apparent nipple shadows
bilaterally.  Repeat film with nipple markers to confirm with be
advisable.  There is no edema or consolidation.

## 2015-05-08 ENCOUNTER — Encounter: Payer: Self-pay | Admitting: Gastroenterology

## 2015-05-13 ENCOUNTER — Ambulatory Visit: Payer: 59

## 2015-05-16 ENCOUNTER — Other Ambulatory Visit: Payer: Self-pay | Admitting: Family

## 2015-05-24 ENCOUNTER — Telehealth: Payer: Self-pay | Admitting: Family

## 2015-05-24 ENCOUNTER — Other Ambulatory Visit: Payer: Self-pay | Admitting: Family

## 2015-05-24 MED ORDER — ALPRAZOLAM 0.5 MG PO TABS: ORAL_TABLET | ORAL | Status: DC

## 2015-05-24 NOTE — Telephone Encounter (Signed)
Denise King is requesting refill for ALPRAZolam (XANAX) 0.5 MG tablet [[id number] for patient.

## 2015-05-24 NOTE — Telephone Encounter (Signed)
Medication refilled

## 2015-05-27 ENCOUNTER — Other Ambulatory Visit: Payer: Self-pay | Admitting: Family

## 2015-06-06 ENCOUNTER — Other Ambulatory Visit: Payer: Self-pay | Admitting: Pulmonary Disease

## 2015-06-07 ENCOUNTER — Other Ambulatory Visit: Payer: Self-pay | Admitting: Family

## 2015-06-24 IMAGING — CT CT T SPINE W/O CM
3 of 8 series · 11 of 33 positions shown, 13 images · non-contrast
Comparison: Chest x-ray [DATE].

ADDENDUM:
4 mm pleural based nodule over the right apex. Recommend followup
chest CT 1 year.

This recommendation follows the consensus statement: Guidelines for
Management of Small Pulmonary Nodules Detected on CT Scans: A
Statement from the [HOSPITAL] as published in Radiology
6440; [DATE]. Online at:
[URL]
CLINICAL DATA: Mid back pain. Question fracture after fall
10/01/2013.
EXAM:
CT THORACIC SPINE WITHOUT CONTRAST
TECHNIQUE: Multidetector CT imaging of the thoracic spine was performed without
intravenous contrast administration. Multiplanar CT image
reconstructions were also generated.

[Series 3: t spine bone · axial · 0.31mm/px · z∈[-267,+50]mm · 3 of 128 slices shown, 4 images]
[im 1/128  soft-tissue]
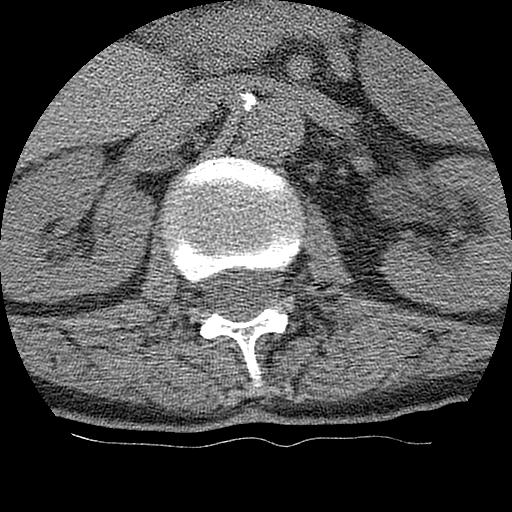
[im 1/128  bone]
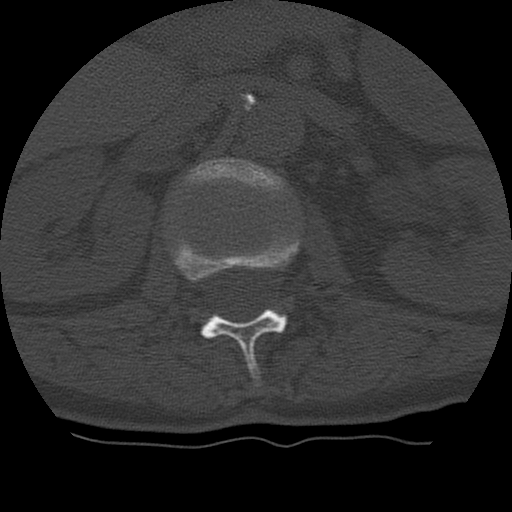
[im 64/128  bone]
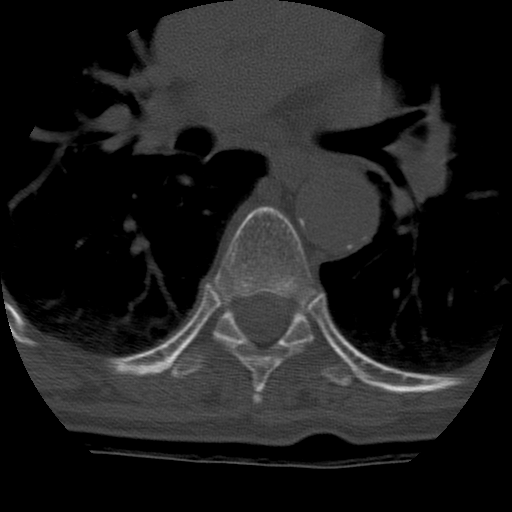
[im 128/128  bone]
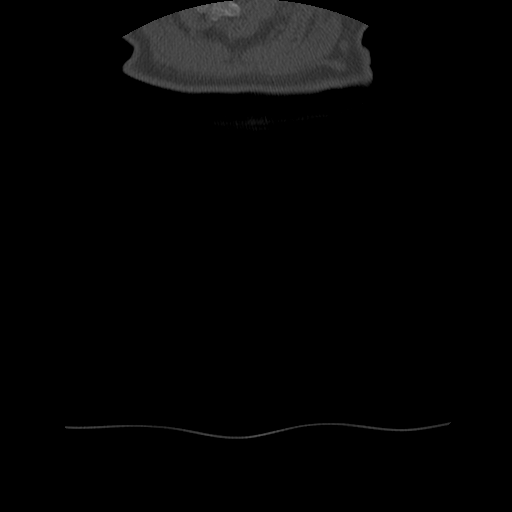

[Series 104: cor lower · coronal · 0.63mm/px · 5 of 60 slices shown, 6 images]
[im 20/60  bone]
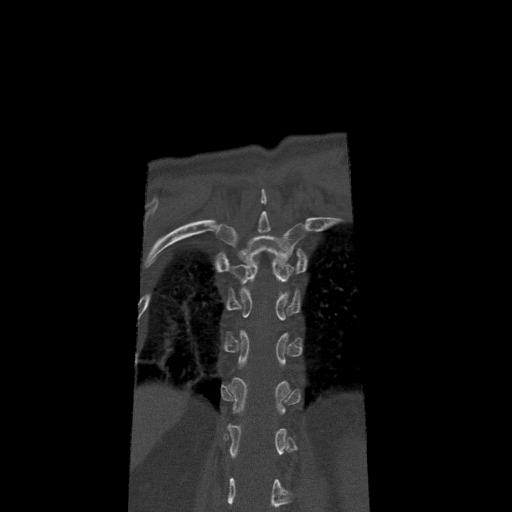
[im 25/60  bone]
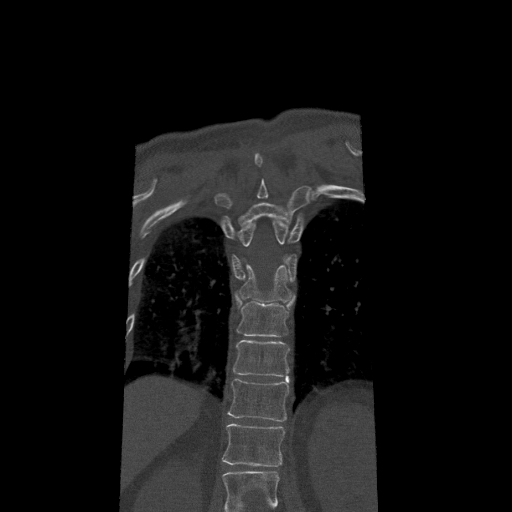
[im 30/60  soft-tissue]
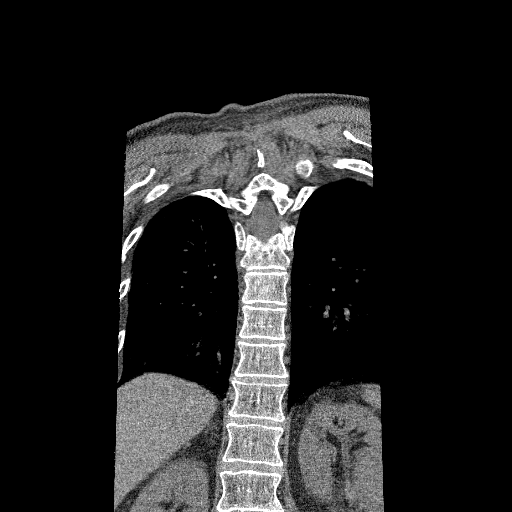
[im 30/60  bone]
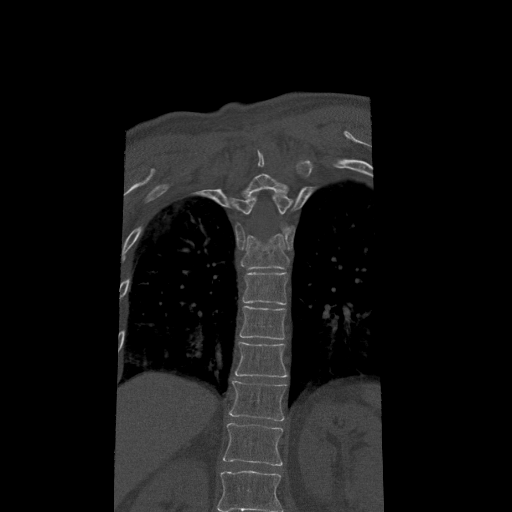
[im 35/60  bone]
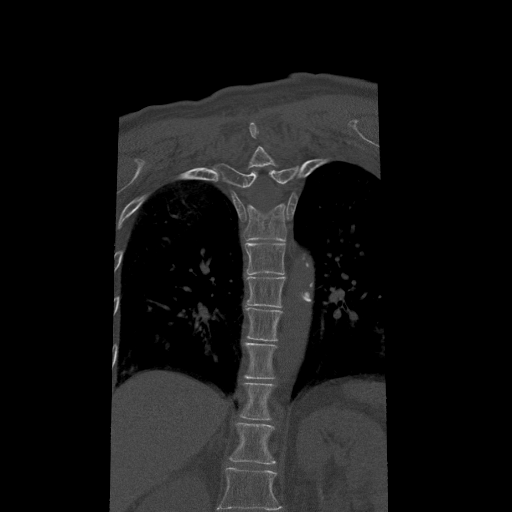
[im 40/60  bone]
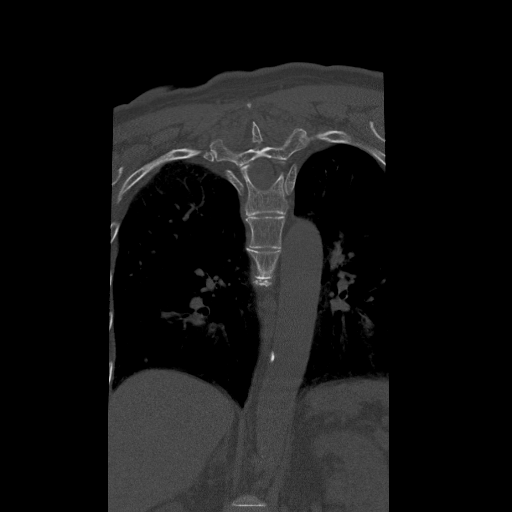

[Series 105: sag · sagittal · 0.63mm/px · 3 of 69 slices shown]
[im 14/69  bone]
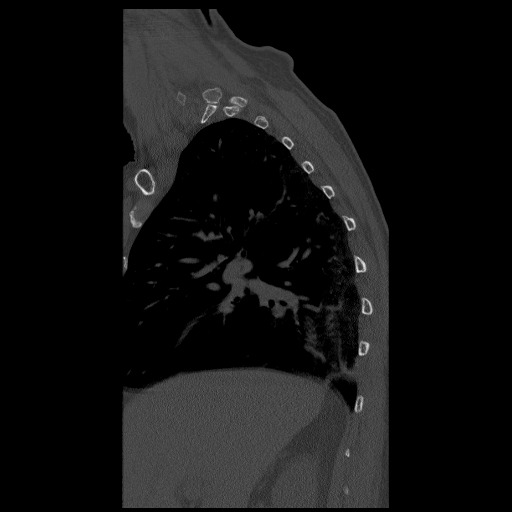
[im 28/69  bone]
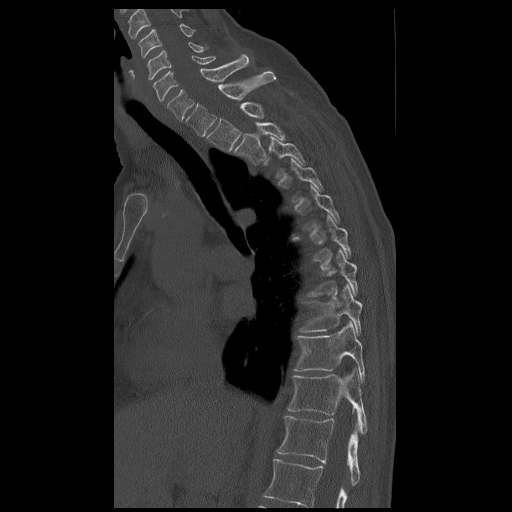
[im 41/69  bone]
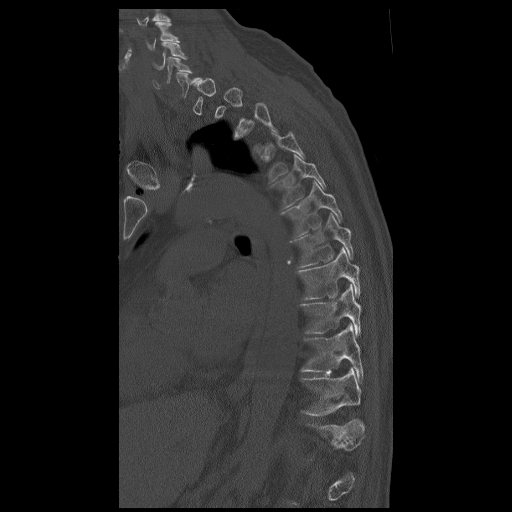

[11 of 33 positions shown; findings below may reference images not displayed]

FINDINGS: Examination demonstrates mild curvature of the thoracic spine convex
to the left. There is mild spondylosis throughout the thoracic
spine. The vertebral body heights are maintained as there is no
evidence of compression fracture or subluxation. The disc spaces are
unremarkable. The spinal canal and cord are unremarkable. There are
minimally displaced fractures of the spinous processes of
approximately the T3 and T4 levels.

There is a 4 mm pleural-based nodule over the right apex. Remainder
of the exam is unremarkable.
IMPRESSION: Minimally displaced fractures of the spinous processes of
approximately T3 and T4. No compression fracture or subluxation.

Mild spondylosis of the thoracic spine with mild curvature convex to
the left.

## 2015-07-03 ENCOUNTER — Other Ambulatory Visit: Payer: Self-pay | Admitting: Family

## 2015-07-04 ENCOUNTER — Ambulatory Visit (INDEPENDENT_AMBULATORY_CARE_PROVIDER_SITE_OTHER): Payer: 59

## 2015-07-04 DIAGNOSIS — E538 Deficiency of other specified B group vitamins: Secondary | ICD-10-CM

## 2015-07-04 MED ORDER — CYANOCOBALAMIN 1000 MCG/ML IJ SOLN
1000.0000 ug | Freq: Once | INTRAMUSCULAR | Status: DC
Start: 2015-07-04 — End: 2015-07-04

## 2015-07-04 NOTE — Telephone Encounter (Signed)
Has 2 different strengths of cymbalta listed on her medication list.

## 2015-07-26 ENCOUNTER — Encounter: Payer: Self-pay | Admitting: Family

## 2015-07-26 ENCOUNTER — Other Ambulatory Visit: Payer: Self-pay | Admitting: Family

## 2015-07-26 DIAGNOSIS — G894 Chronic pain syndrome: Secondary | ICD-10-CM

## 2015-07-29 ENCOUNTER — Other Ambulatory Visit: Payer: Self-pay

## 2015-07-29 MED ORDER — ALPRAZOLAM 0.5 MG PO TABS: ORAL_TABLET | ORAL | Status: DC

## 2015-07-29 NOTE — Addendum Note (Signed)
Addended by: Mauricio Po D on: 07/29/2015 11:56 AM   Modules accepted: Orders

## 2015-08-05 ENCOUNTER — Ambulatory Visit (INDEPENDENT_AMBULATORY_CARE_PROVIDER_SITE_OTHER): Payer: 59

## 2015-08-05 DIAGNOSIS — E538 Deficiency of other specified B group vitamins: Secondary | ICD-10-CM | POA: Diagnosis not present

## 2015-08-06 MED ORDER — CYANOCOBALAMIN 1000 MCG/ML IJ SOLN
1000.0000 ug | Freq: Once | INTRAMUSCULAR | Status: AC
  Administered 2015-08-05: 1000 ug via INTRAMUSCULAR

## 2015-08-30 ENCOUNTER — Encounter: Payer: Self-pay | Admitting: Gastroenterology

## 2015-08-30 DIAGNOSIS — R197 Diarrhea, unspecified: Secondary | ICD-10-CM

## 2015-08-30 NOTE — Telephone Encounter (Signed)
Patient notified of Dr. Celesta Aver recommendations.  She will come for stool studies this afternoon

## 2015-08-30 NOTE — Telephone Encounter (Signed)
Patient with a 10 day history of watery diarrhea.  She said the last 2 days have been slightly better, but still having multiple episodes a day of diarrhea.  She reports that lotronex causes constipation and abdominal pain when she takes it.  Daryll Brod this is a Dr. Deatra Ina patient and you are DOD.  Please advise

## 2015-09-03 ENCOUNTER — Other Ambulatory Visit: Payer: 59

## 2015-09-03 ENCOUNTER — Other Ambulatory Visit: Payer: Self-pay | Admitting: Gastroenterology

## 2015-09-03 DIAGNOSIS — R197 Diarrhea, unspecified: Secondary | ICD-10-CM

## 2015-09-04 LAB — CLOSTRIDIUM DIFFICILE BY PCR: Toxigenic C. Difficile by PCR: NOT DETECTED

## 2015-09-05 ENCOUNTER — Ambulatory Visit (INDEPENDENT_AMBULATORY_CARE_PROVIDER_SITE_OTHER): Payer: 59

## 2015-09-05 DIAGNOSIS — E538 Deficiency of other specified B group vitamins: Secondary | ICD-10-CM | POA: Diagnosis not present

## 2015-09-05 MED ORDER — CYANOCOBALAMIN 1000 MCG/ML IJ SOLN
1000.0000 ug | Freq: Once | INTRAMUSCULAR | Status: AC
  Administered 2015-09-05: 1000 ug via INTRAMUSCULAR

## 2015-09-06 ENCOUNTER — Encounter: Payer: Self-pay | Admitting: Gastroenterology

## 2015-09-07 LAB — STOOL CULTURE

## 2015-09-10 ENCOUNTER — Other Ambulatory Visit: Payer: Self-pay | Admitting: Family

## 2015-09-11 NOTE — Telephone Encounter (Signed)
Last refill of xanax was 8/1. Pt just picked up cymbalta on 9/7 and still has one more refill. Please advise

## 2015-09-12 ENCOUNTER — Other Ambulatory Visit: Payer: Self-pay | Admitting: Family

## 2015-10-03 ENCOUNTER — Other Ambulatory Visit: Payer: Self-pay | Admitting: Family

## 2015-10-04 MED ORDER — DULOXETINE HCL 30 MG PO CPEP: 30.0000 mg | ORAL_CAPSULE | Freq: Every day | ORAL | Status: DC

## 2015-10-04 NOTE — Addendum Note (Signed)
Addended by: Lowella Dandy on: 10/04/2015 10:51 AM   Modules accepted: Orders

## 2015-10-07 ENCOUNTER — Ambulatory Visit (INDEPENDENT_AMBULATORY_CARE_PROVIDER_SITE_OTHER): Payer: 59

## 2015-10-07 DIAGNOSIS — E538 Deficiency of other specified B group vitamins: Secondary | ICD-10-CM

## 2015-10-07 MED ORDER — CYANOCOBALAMIN 1000 MCG/ML IJ SOLN
1000.0000 ug | Freq: Once | INTRAMUSCULAR | Status: AC
  Administered 2015-10-07: 1000 ug via INTRAMUSCULAR

## 2015-10-10 ENCOUNTER — Ambulatory Visit (INDEPENDENT_AMBULATORY_CARE_PROVIDER_SITE_OTHER): Payer: 59

## 2015-10-10 DIAGNOSIS — Z23 Encounter for immunization: Secondary | ICD-10-CM

## 2015-10-25 ENCOUNTER — Ambulatory Visit (INDEPENDENT_AMBULATORY_CARE_PROVIDER_SITE_OTHER): Payer: 59 | Admitting: Emergency Medicine

## 2015-10-25 ENCOUNTER — Ambulatory Visit (INDEPENDENT_AMBULATORY_CARE_PROVIDER_SITE_OTHER): Payer: 59

## 2015-10-25 VITALS — BP 132/75 | HR 102 | Temp 98.3°F | Resp 16 | Ht 62.5 in | Wt 119.0 lb

## 2015-10-25 DIAGNOSIS — R0789 Other chest pain: Secondary | ICD-10-CM

## 2015-10-25 DIAGNOSIS — S2231XA Fracture of one rib, right side, initial encounter for closed fracture: Secondary | ICD-10-CM | POA: Diagnosis not present

## 2015-10-25 MED ORDER — HYDROCODONE-ACETAMINOPHEN 5-325 MG PO TABS: 1.0000 | ORAL_TABLET | ORAL | Status: DC | PRN

## 2015-10-25 NOTE — Patient Instructions (Signed)

## 2015-10-25 NOTE — Progress Notes (Signed)
Subjective:  Patient ID: Denise King, female    DOB: 05-14-1951  Age: 64 y.o. MRN: [id number]  CC: Flank Pain   HPI Denise King presents  with a history of osteopenia and injured her right chest wall. Anteriorly. Shortness breath wheezing or cough. No hemoptysis. No nausea vomiting or dizziness. She has no abdominal pain she had chest pain that is worse when she takes deep breath or coughs or sneezes. She on chronic fentanyl patches for back pain. She has no fever chills  History Denise King has a past medical history of Unspecified chronic bronchitis ([place]); Tobacco use disorder; Reflux esophagitis; Diverticulosis of colon; Irritable bowel syndrome; colonic polyps; Acute cystitis; Back pain; Chronic pain syndrome; Osteopenia; Vertigo; Dysthymia; Depression; Vitamin B12 deficiency; Allergy; Anxiety; Arthritis; Blood transfusion; Hypertension; Osteoporosis; Gastritis; GERD (gastroesophageal reflux disease); Anemia; H/O chest pain (Dec. 2013); and Wears glasses.   She has past surgical history that includes Appendectomy; Tubal ligation; Back surgery (10-09); Tonsillectomy; laparoscopy (N/A, 02/21/2013); Colonoscopy; Upper gastrointestinal endoscopy; and Carpal tunnel release (Right, 08/14/2014).   Her  family history includes Colon cancer in her brother and father; Heart disease in her mother; Prostate cancer in her father; Skin cancer in her sister. There is no history of Esophageal cancer, Rectal cancer, or Stomach cancer.  She   reports that she has been smoking Cigarettes.  She has a 30 pack-year smoking history. She has never used smokeless tobacco. She reports that she drinks alcohol. She reports that she does not use illicit drugs.  Outpatient Prescriptions Prior to Visit  Medication Sig Dispense Refill  . ALPRAZolam (XANAX) 0.5 MG tablet TAKE 1/2 TO 1 TABLET THREE TIMES DAILY AS NEEDED. 90 tablet 0  . cyanocobalamin (,VITAMIN B-12,) 1000 MCG/ML injection Inject 1,000 mcg  into the muscle every 30 (thirty) days.    . Doxylamine Succinate, Sleep, (UNISOM) 25 MG tablet Take 25 mg by mouth at bedtime.     . DULoxetine (CYMBALTA) 30 MG capsule Take 1 capsule (30 mg total) by mouth daily. 90 capsule 0  . DULoxetine (CYMBALTA) 60 MG capsule TAKE 1 CAPSULE DAILY. 30 capsule 3  . fentaNYL (DURAGESIC - DOSED MCG/HR) 25 MCG/HR patch Place 25 mcg onto the skin every 3 (three) days.    . hyoscyamine (LEVSIN SL) 0.125 MG SL tablet Place 1 tablet (0.125 mg total) under the tongue every 4 (four) hours as needed for cramping. 60 tablet 3  . ondansetron (ZOFRAN) 4 MG tablet 1 every 6 hours as needed for nausea 30 tablet 0  . pantoprazole (PROTONIX) 40 MG tablet TAKE 1 TABLET 30 MINUTES BEFORE BREAKFAST 30 tablet 6  . zoledronic acid (RECLAST) 5 MG/100ML SOLN Inject 5 mg into the vein once. Patient is due for one when she feels better    . alosetron (LOTRONEX) 0.5 MG tablet Take 0.5 mg by mouth 2 (two) times daily as needed (for diarrhea).    Marland Kitchen buPROPion (WELLBUTRIN XL) 150 MG 24 hr tablet TAKE 1 TABLET EACH DAY. (Patient not taking: Reported on 10/25/2015) 30 tablet 5  . fosfomycin (MONUROL) 3 G PACK Take 3 g by mouth once. (Patient not taking: Reported on 10/25/2015) 1 g 0  . nitrofurantoin, macrocrystal-monohydrate, (MACROBID) 100 MG capsule Take 1 capsule (100 mg total) by mouth 2 (two) times daily. (Patient not taking: Reported on 10/25/2015) 14 capsule 0  . oxyCODONE-acetaminophen (PERCOCET) 10-325 MG per tablet Take 1 tablet by mouth every 4 (four) hours as needed for pain.    . promethazine (  PHENERGAN) 25 MG suppository Place 1 suppository (25 mg total) rectally every 6 (six) hours as needed for nausea or vomiting. 12 suppository 0   No facility-administered medications prior to visit.    Social History   Social History  . Marital Status: Married    Spouse Name: N/A  . Number of Children: 4  . Years of Education: 16   Occupational History  . retired     disablility     Social History Main Topics  . Smoking status: Current Every Day Smoker -- 1.00 packs/day for 30 years    Types: Cigarettes  . Smokeless tobacco: Never Used  . Alcohol Use: 0.0 oz/week    0 Standard drinks or equivalent per week     Comment: occasional  . Drug Use: No  . Sexual Activity: Not Asked   Other Topics Concern  . None   Social History Narrative   Married x 38 yrs. Has BS degree in Horticulture from [employer]. Currently resides in a house with her husband. 3 cats.  Fun: used to like to do a lot of things.    Denies religious beliefs that would effect health care.      Review of Systems  Constitutional: Negative for fever, chills and appetite change.  HENT: Negative for congestion, ear pain, postnasal drip, sinus pressure and sore throat.   Eyes: Negative for pain and redness.  Respiratory: Negative for cough, shortness of breath and wheezing.   Cardiovascular: Positive for chest pain. Negative for leg swelling.  Gastrointestinal: Negative for nausea, vomiting, abdominal pain, diarrhea, constipation and blood in stool.  Endocrine: Negative for polyuria.  Genitourinary: Negative for dysuria, urgency, frequency and flank pain.  Musculoskeletal: Negative for gait problem.  Skin: Negative for rash.  Neurological: Negative for weakness and headaches.  Psychiatric/Behavioral: Negative for confusion and decreased concentration. The patient is not nervous/anxious.     Objective:  BP 132/75 mmHg  Pulse 102  Temp(Src) 98.3 F (36.8 C) (Oral)  Resp 16  Ht 5' 2.5" (1.588 m)  Wt 119 lb (53.978 kg)  BMI 21.41 kg/m2  SpO2 96%  Physical Exam  Constitutional: She is oriented to person, place, and time. She appears well-developed and well-nourished. No distress.  HENT:  Head: Normocephalic and atraumatic.  Right Ear: External ear normal.  Left Ear: External ear normal.  Nose: Nose normal.  Eyes: Conjunctivae and EOM are normal. Pupils are equal, round, and reactive to light.  No scleral icterus.  Neck: Normal range of motion. Neck supple. No tracheal deviation present.  Cardiovascular: Normal rate, regular rhythm and normal heart sounds.   Pulmonary/Chest: Effort normal. No respiratory distress. She has no wheezes. She has no rales. She exhibits tenderness and bony tenderness.  Abdominal: She exhibits no mass. There is no tenderness. There is no rebound and no guarding.  Musculoskeletal: She exhibits no edema.  Lymphadenopathy:    She has no cervical adenopathy.  Neurological: She is alert and oriented to person, place, and time. Coordination normal.  Skin: Skin is warm and dry. No rash noted.  Psychiatric: She has a normal mood and affect. Her behavior is normal.   She has very well localized right mid anterior chest wall   Assessment & Plan:   Bintou was seen today for flank pain.  Diagnoses and all orders for this visit:  Chest wall pain -     DG Chest 2 View; Future  Fracture, rib, right, closed, initial encounter  Other orders -  HYDROcodone-acetaminophen (NORCO) 5-325 MG tablet; Take 1-2 tablets by mouth every 4 (four) hours as needed.   I have discontinued Ms. Puebla's promethazine and oxyCODONE-acetaminophen. I am also having her start on HYDROcodone-acetaminophen. Additionally, I am having her maintain her zoledronic acid, doxylamine (Sleep), cyanocobalamin, hyoscyamine, alosetron, pantoprazole, ondansetron, fentaNYL, nitrofurantoin (macrocrystal-monohydrate), fosfomycin, buPROPion, DULoxetine, ALPRAZolam, and DULoxetine.  Meds ordered this encounter  Medications  . HYDROcodone-acetaminophen (NORCO) 5-325 MG tablet    Sig: Take 1-2 tablets by mouth every 4 (four) hours as needed.    Dispense:  30 tablet    Refill:  0    Appropriate red flag conditions were discussed with the patient as well as actions that should be taken.  Patient expressed his understanding.  Follow-up: Return if symptoms worsen or fail to improve.  Roselee Culver, MD   [facility] reading (PRIMARY) by  Dr. Ouida Sills negative for hemothorax or pneumothorax possible fracture seventh rib.

## 2015-10-28 ENCOUNTER — Encounter: Payer: Self-pay | Admitting: Family

## 2015-10-30 ENCOUNTER — Telehealth: Payer: Self-pay

## 2015-10-30 ENCOUNTER — Other Ambulatory Visit: Payer: Self-pay | Admitting: Family

## 2015-10-30 ENCOUNTER — Encounter: Payer: Self-pay | Admitting: Gastroenterology

## 2015-10-30 NOTE — Telephone Encounter (Signed)
error 

## 2015-10-30 NOTE — Telephone Encounter (Signed)
Called and left message with patient's husband for pt to call our office.

## 2015-10-31 NOTE — Telephone Encounter (Signed)
OK 

## 2015-10-31 NOTE — Telephone Encounter (Signed)
Pt is requesting Dr. Carlean Purl as her new physician as she has seen him in the past. Is this ok Dr. Carlean Purl? If so she is asking for refills of Zofran.

## 2015-10-31 NOTE — Telephone Encounter (Signed)
Returning your call. °

## 2015-11-02 ENCOUNTER — Other Ambulatory Visit: Payer: Self-pay | Admitting: Family

## 2015-11-04 ENCOUNTER — Other Ambulatory Visit: Payer: Self-pay | Admitting: Family

## 2015-11-04 MED ORDER — ALPRAZOLAM 0.5 MG PO TABS: 0.2500 mg | ORAL_TABLET | Freq: Three times a day (TID) | ORAL | Status: DC | PRN

## 2015-11-04 MED ORDER — ONDANSETRON HCL 4 MG PO TABS: ORAL_TABLET | ORAL | Status: DC

## 2015-11-04 NOTE — Addendum Note (Signed)
Addended by: Mauricio Po D on: 11/04/2015 12:52 PM   Modules accepted: Orders

## 2015-11-04 NOTE — Telephone Encounter (Signed)
Left vm for pt to callback 

## 2015-11-04 NOTE — Telephone Encounter (Signed)
Pt called and I informed her that zofran was sent to her pharmacy. I also scheduled an appointment for her to see Dr Carlean Purl in January.

## 2015-11-07 ENCOUNTER — Ambulatory Visit: Payer: 59

## 2015-11-08 ENCOUNTER — Ambulatory Visit (INDEPENDENT_AMBULATORY_CARE_PROVIDER_SITE_OTHER): Payer: 59

## 2015-11-08 DIAGNOSIS — E538 Deficiency of other specified B group vitamins: Secondary | ICD-10-CM | POA: Diagnosis not present

## 2015-11-08 MED ORDER — CYANOCOBALAMIN 1000 MCG/ML IJ SOLN
1000.0000 ug | Freq: Once | INTRAMUSCULAR | Status: AC
  Administered 2015-11-08: 1000 ug via INTRAMUSCULAR

## 2015-11-18 ENCOUNTER — Encounter: Payer: Self-pay | Admitting: Family

## 2015-11-19 ENCOUNTER — Other Ambulatory Visit: Payer: Self-pay | Admitting: Family

## 2015-11-19 DIAGNOSIS — M5440 Lumbago with sciatica, unspecified side: Secondary | ICD-10-CM

## 2015-11-19 DIAGNOSIS — G894 Chronic pain syndrome: Secondary | ICD-10-CM

## 2015-11-25 ENCOUNTER — Encounter: Payer: Self-pay | Admitting: Family

## 2015-11-25 ENCOUNTER — Ambulatory Visit (INDEPENDENT_AMBULATORY_CARE_PROVIDER_SITE_OTHER): Payer: 59 | Admitting: Family

## 2015-11-25 VITALS — BP 132/88 | HR 90 | Temp 98.5°F | Resp 18 | Ht 62.5 in | Wt 122.8 lb

## 2015-11-25 DIAGNOSIS — F411 Generalized anxiety disorder: Secondary | ICD-10-CM | POA: Diagnosis not present

## 2015-11-25 DIAGNOSIS — F341 Dysthymic disorder: Secondary | ICD-10-CM

## 2015-11-25 MED ORDER — DULOXETINE HCL 60 MG PO CPEP: 60.0000 mg | ORAL_CAPSULE | Freq: Every day | ORAL | Status: DC

## 2015-11-25 MED ORDER — DULOXETINE HCL 30 MG PO CPEP: 30.0000 mg | ORAL_CAPSULE | Freq: Every day | ORAL | Status: DC

## 2015-11-25 NOTE — Assessment & Plan Note (Signed)
Stable with current dosage of duloxetine although effects of the medication are questionable. Denies suicidal ideations or adverse side effects. Continue current dosage of duloxetine. Follow up in 6 months or sooner if needed.

## 2015-11-25 NOTE — Patient Instructions (Signed)
Thank you for choosing  HealthCare.  Summary/Instructions:  Your prescription(s) have been submitted to your pharmacy or been printed and provided for you. Please take as directed and contact our office if you believe you are having problem(s) with the medication(s) or have any questions.  If your symptoms worsen or fail to improve, please contact our office for further instruction, or in case of emergency go directly to the emergency room at the closest medical facility.   Please continue to take your medications as prescribed.  

## 2015-11-25 NOTE — Progress Notes (Signed)
Pre visit review using our clinic review tool, if applicable. No additional management support is needed unless otherwise documented below in the visit note. 

## 2015-11-25 NOTE — Assessment & Plan Note (Signed)
Symptoms of anxiety and insomnia are well controlled with current dosage of alprazolam. Denies adverse side effects. Getting adequate sleep. Continue current dosage of alprazolam. Follow up in 6 months.

## 2015-11-25 NOTE — Progress Notes (Signed)
Subjective:    Patient ID: Denise King, female    DOB: July 05, 1951, 64 y.o.   MRN: [id number]  Chief Complaint  Patient presents with  . Medication Management    wants to talk about medications    HPI:  Denise King is a 64 y.o. female who  has a past medical history of Unspecified chronic bronchitis ([place]); Tobacco use disorder; Reflux esophagitis; Diverticulosis of colon; Irritable bowel syndrome; colonic polyps; Acute cystitis; Back pain; Chronic pain syndrome; Osteopenia; Vertigo; Dysthymia; Depression; Vitamin B12 deficiency; Allergy; Anxiety; Arthritis; Blood transfusion; Hypertension; Osteoporosis; Gastritis; GERD (gastroesophageal reflux disease); Anemia; H/O chest pain (Dec. 2013); and Wears glasses. and presents today for a follow up office visit.  1.) Depression - Currently maintained on duloxetine. Takes the medication as prescribed and denies adverse side effects. Does not believe the duloxetine has been helping very much. Denies suicidal ideation. Continues to experience multifactorial stressors from family and life balance.  2.) Anxiety - Currently maintained on alprazolam that she is using primarily for sleep and anxiety. Takes the medication as prescribed and denies adverse side effects. Generally takes the medication nightly. Average about 6-7 hours of sleep per evening.   Allergies  Allergen Reactions  . Atorvastatin Nausea And Vomiting  . Levofloxacin     Reaction: achilles tendon pain  . Omnicef [Cefdinir] Diarrhea    Uncontrollable diarrhea  . Trazodone And Nefazodone     "Felt like I was going to faint"  . Sulfonamide Derivatives Rash     Current Outpatient Prescriptions on File Prior to Visit  Medication Sig Dispense Refill  . ALPRAZolam (XANAX) 0.5 MG tablet Take 0.5-1 tablets (0.25-0.5 mg total) by mouth 3 (three) times daily as needed for anxiety. 90 tablet 0  . cyanocobalamin (,VITAMIN B-12,) 1000 MCG/ML injection Inject 1,000 mcg into the  muscle every 30 (thirty) days.    . Doxylamine Succinate, Sleep, (UNISOM) 25 MG tablet Take 25 mg by mouth at bedtime.     Marland Kitchen HYDROcodone-acetaminophen (NORCO) 5-325 MG tablet Take 1-2 tablets by mouth every 4 (four) hours as needed. 30 tablet 0  . hyoscyamine (LEVSIN SL) 0.125 MG SL tablet Place 1 tablet (0.125 mg total) under the tongue every 4 (four) hours as needed for cramping. 60 tablet 3  . ondansetron (ZOFRAN) 4 MG tablet 1 every 6 hours as needed for nausea 30 tablet 0  . pantoprazole (PROTONIX) 40 MG tablet TAKE 1 TABLET 30 MINUTES BEFORE BREAKFAST 30 tablet 6  . zoledronic acid (RECLAST) 5 MG/100ML SOLN Inject 5 mg into the vein once. Patient is due for one when she feels better     No current facility-administered medications on file prior to visit.    Review of Systems  Constitutional: Negative for fever and chills.  Psychiatric/Behavioral: Positive for dysphoric mood. Negative for suicidal ideas and sleep disturbance. The patient is nervous/anxious.       Objective:    BP 132/88 mmHg  Pulse 90  Temp(Src) 98.5 F (36.9 C) (Oral)  Resp 18  Ht 5' 2.5" (1.588 m)  Wt 122 lb 12.8 oz (55.702 kg)  BMI 22.09 kg/m2  SpO2 99% Nursing note and vital signs reviewed.  Physical Exam  Constitutional: She is oriented to person, place, and time. She appears well-developed and well-nourished. No distress.  Cardiovascular: Normal rate, regular rhythm, normal heart sounds and intact distal pulses.   Pulmonary/Chest: Effort normal and breath sounds normal.  Neurological: She is alert and oriented to person, place, and time.  Skin: Skin is warm and dry.  Psychiatric: She has a normal mood and affect. Her behavior is normal. Judgment and thought content normal.       Assessment & Plan:   Problem List Items Addressed This Visit      Other   DYSTHYMIA - Primary    Stable with current dosage of duloxetine although effects of the medication are questionable. Denies suicidal ideations or  adverse side effects. Continue current dosage of duloxetine. Follow up in 6 months or sooner if needed.       Relevant Medications   DULoxetine (CYMBALTA) 60 MG capsule   DULoxetine (CYMBALTA) 30 MG capsule   Generalized anxiety disorder    Symptoms of anxiety and insomnia are well controlled with current dosage of alprazolam. Denies adverse side effects. Getting adequate sleep. Continue current dosage of alprazolam. Follow up in 6 months.

## 2015-12-10 ENCOUNTER — Telehealth: Payer: Self-pay | Admitting: Internal Medicine

## 2015-12-10 ENCOUNTER — Ambulatory Visit: Payer: 59

## 2015-12-10 MED ORDER — HYOSCYAMINE SULFATE 0.125 MG SL SUBL: 0.1250 mg | SUBLINGUAL_TABLET | SUBLINGUAL | Status: DC | PRN

## 2015-12-10 NOTE — Telephone Encounter (Signed)
rx sent Patient notified 

## 2015-12-12 ENCOUNTER — Ambulatory Visit (INDEPENDENT_AMBULATORY_CARE_PROVIDER_SITE_OTHER): Payer: 59

## 2015-12-12 DIAGNOSIS — E538 Deficiency of other specified B group vitamins: Secondary | ICD-10-CM | POA: Diagnosis not present

## 2015-12-12 MED ORDER — CYANOCOBALAMIN 1000 MCG/ML IJ SOLN
1000.0000 ug | Freq: Once | INTRAMUSCULAR | Status: AC
  Administered 2015-12-12: 1000 ug via INTRAMUSCULAR

## 2015-12-16 ENCOUNTER — Other Ambulatory Visit (HOSPITAL_COMMUNITY): Payer: Self-pay | Admitting: *Deleted

## 2015-12-17 ENCOUNTER — Ambulatory Visit (HOSPITAL_COMMUNITY)
Admission: RE | Admit: 2015-12-17 | Discharge: 2015-12-17 | Disposition: A | Discharge status: Home / Self Care | Payer: 59 | Source: Ambulatory Visit | Attending: Obstetrics and Gynecology | Admitting: Obstetrics and Gynecology

## 2015-12-17 DIAGNOSIS — M810 Age-related osteoporosis without current pathological fracture: Secondary | ICD-10-CM | POA: Insufficient documentation

## 2015-12-17 MED ORDER — ZOLEDRONIC ACID 5 MG/100ML IV SOLN
5.0000 mg | Freq: Once | INTRAVENOUS | Status: AC
  Administered 2015-12-17: 5 mg via INTRAVENOUS

## 2015-12-17 MED ORDER — ZOLEDRONIC ACID 5 MG/100ML IV SOLN
INTRAVENOUS | Status: AC
  Filled 2015-12-17: qty 100

## 2015-12-24 ENCOUNTER — Other Ambulatory Visit: Payer: Self-pay | Admitting: Family

## 2015-12-25 NOTE — Telephone Encounter (Signed)
Faxed script to [facility].../lmb 

## 2016-01-03 DIAGNOSIS — Z0271 Encounter for disability determination: Secondary | ICD-10-CM

## 2016-01-07 ENCOUNTER — Encounter: Payer: Self-pay | Admitting: Internal Medicine

## 2016-01-07 ENCOUNTER — Ambulatory Visit (INDEPENDENT_AMBULATORY_CARE_PROVIDER_SITE_OTHER): Payer: BLUE CROSS/BLUE SHIELD | Admitting: Internal Medicine

## 2016-01-07 ENCOUNTER — Encounter: Payer: Self-pay | Admitting: Family

## 2016-01-07 VITALS — BP 130/80 | HR 108 | Temp 98.8°F | Ht 63.0 in | Wt 123.2 lb

## 2016-01-07 DIAGNOSIS — T402X5A Adverse effect of other opioids, initial encounter: Secondary | ICD-10-CM

## 2016-01-07 DIAGNOSIS — J069 Acute upper respiratory infection, unspecified: Secondary | ICD-10-CM

## 2016-01-07 DIAGNOSIS — K5903 Drug induced constipation: Secondary | ICD-10-CM

## 2016-01-07 NOTE — Progress Notes (Signed)
   Subjective:    Patient ID: Denise King, female    DOB: Apr 29, 1951, 65 y.o.   MRN: [id number] Chief complaint: Nausea constipation worse on different narcotic HPI Patient is here, she was previously followed by Dr. Deatra Ina. She has GERD and [name]. This was originally routine follow-up, however she notes that she's having increasing difficulty defecating. She previously had diarrhea predominant irritable bowel syndrome. She is on MS Contin and Norco for chronic low back pain area since going on MS Contin she's had more problems defecating and moves her bowels once every other day or so with some straining and incomplete defecation sense and feels bloated. There is also associated nausea.  Medications, allergies, past medical history, past surgical history, family history and social history are reviewed and updated in the EMR.  Review of Systems As above, she is also having a few days of cough, sore throat, rhinorrhea and fever. Sputum is clear. Daughter had similar problem recently    Objective:   Physical Exam @BP  130/80 mmHg  Pulse 108  Temp(Src) 98.8 F (37.1 C)  Ht 5\' 3"  (1.6 m)  Wt 123 lb 4 oz (55.906 kg)  BMI 21.84 kg/m2@  General:  NAD Eyes:   Anicteric Pharynx:  is clear of exudates or lesions there is perhaps faint erythema Lungs:  clear Heart:: S1S2 no rubs, murmurs or gallops Abdomen:  soft and nontender, BS+ Ext:   no edema, cyanosis or clubbing    Data Reviewed:  Previous GI endoscopic procedures filed in the EMR    Assessment & Plan:   1. Constipation due to opioid therapy   2. Acute upper respiratory infection    She will try MiraLAX one or 2 doses daily. I explained that other options would be things like Amitiza, Linzess, movantik.   will see me in 1 year routinely sooner if needed.  Follow-up with PCP if URI does not resolve or sputum color changes  I will send a copy to Dr. Nicholaus Bloom

## 2016-01-07 NOTE — Patient Instructions (Signed)
   Take 1-2 doses of Miralax daily and if that doesn't help your constipation call us back.   Follow up with Dr Carlean Purl in a year or sooner if needed.     I appreciate the opportunity to care for you. Silvano Rusk, MD, [facility]

## 2016-01-10 ENCOUNTER — Ambulatory Visit (INDEPENDENT_AMBULATORY_CARE_PROVIDER_SITE_OTHER): Payer: BLUE CROSS/BLUE SHIELD

## 2016-01-10 DIAGNOSIS — E538 Deficiency of other specified B group vitamins: Secondary | ICD-10-CM | POA: Diagnosis not present

## 2016-01-10 MED ORDER — CYANOCOBALAMIN 1000 MCG/ML IJ SOLN
1000.0000 ug | Freq: Once | INTRAMUSCULAR | Status: AC
  Administered 2016-01-10: 1000 ug via INTRAMUSCULAR

## 2016-01-13 ENCOUNTER — Ambulatory Visit (INDEPENDENT_AMBULATORY_CARE_PROVIDER_SITE_OTHER): Payer: BLUE CROSS/BLUE SHIELD | Admitting: Family Medicine

## 2016-01-13 ENCOUNTER — Encounter: Payer: Self-pay | Admitting: Family Medicine

## 2016-01-13 VITALS — BP 124/70 | HR 99 | Temp 97.8°F | Ht 63.0 in | Wt 120.6 lb

## 2016-01-13 DIAGNOSIS — J069 Acute upper respiratory infection, unspecified: Secondary | ICD-10-CM

## 2016-01-13 MED ORDER — BENZONATATE 100 MG PO CAPS: 100.0000 mg | ORAL_CAPSULE | Freq: Three times a day (TID) | ORAL | Status: DC | PRN

## 2016-01-13 MED ORDER — PREDNISONE 20 MG PO TABS: 40.0000 mg | ORAL_TABLET | Freq: Every day | ORAL | Status: DC

## 2016-01-13 NOTE — Progress Notes (Signed)
Pre visit review using our clinic review tool, if applicable. No additional management support is needed unless otherwise documented below in the visit note. 

## 2016-01-13 NOTE — Patient Instructions (Signed)
Please take the medications as instructed.  If worsening or not improving please follow up with your doctor immediately.  I hope you feel better soon!

## 2016-01-13 NOTE — Progress Notes (Signed)
HPI:  Denise King is a pleasant 65 yo patient of Terri Piedra, here for an acute visit for:  Cough: -listed PMH sig for chronic bronchitis, chronic pain on chronic opoids, Tobacco use, allergies, anxiety, depression and GERD -started: 6 days ago -symptoms: clearnasal congestion, sore throat, cough, fever initially - reports now is better except for cough is persistent with some wheezing and cough is keeping her up, denies thick white or dark mucus production, reports nasal congestion is all clear and not coughing up sputum -denies:fever, SOB, NVD, tooth pain, sinus pain -sick contacts/travel/risks: denies flu exposure -she reports she does not want to take an antibiotic unless absolutely needed as they all upset her stomach  ROS: See pertinent positives and negatives per HPI.  Past Medical History  Diagnosis Date  . Unspecified chronic bronchitis ([place])   . Tobacco use disorder   . Reflux esophagitis   . Diverticulosis of colon   . Irritable bowel syndrome   . Hx of colonic polyps   . Acute cystitis   . Back pain   . Chronic pain syndrome   . Osteopenia   . Vertigo   . Dysthymia   . Depression   . Vitamin B12 deficiency   . Allergy     as a child grew out of them  . Anxiety   . Arthritis   . Blood transfusion   . Hypertension   . Osteoporosis   . Gastritis   . GERD (gastroesophageal reflux disease)   . Anemia     pernicious anemia  . H/O chest pain Dec. 2013    no work up done  . Wears glasses     Past Surgical History  Procedure Laterality Date  . Appendectomy    . Tubal ligation    . Back surgery  10-09    Dr. Patrice Paradise  . Tonsillectomy    . Laparoscopy N/A 02/21/2013    Procedure: LAPAROSCOPY OPERATIVE;  Surgeon: Margarette Asal, MD;  Location: [facility];  Service: Gynecology;  Laterality: N/A;  REQUESTING 5MM SCOPE WITH CAMERA  . Colonoscopy    . Upper gastrointestinal endoscopy    . Carpal tunnel release Right 08/14/2014    Procedure: RIGHT CARPAL  TUNNEL RELEASE AND INJECT LEFT THUMB;  Surgeon: Daryll Brod, MD;  Location: [facility];  Service: Orthopedics;  Laterality: Right;    Family History  Problem Relation Age of Onset  . Colon cancer Father   . Prostate cancer Father   . Heart disease Mother     prev MVR, also has DJD  . Colon cancer Brother   . Skin cancer Sister   . Esophageal cancer Neg Hx   . Rectal cancer Neg Hx   . Stomach cancer Neg Hx     Social History   Social History  . Marital Status: Married    Spouse Name: N/A  . Number of Children: 4  . Years of Education: 16   Occupational History  . retired     disablility   Social History Main Topics  . Smoking status: Current Every Day Smoker -- 1.00 packs/day for 30 years    Types: Cigarettes  . Smokeless tobacco: Never Used  . Alcohol Use: 0.0 oz/week    0 Standard drinks or equivalent per week     Comment: occasional  . Drug Use: No  . Sexual Activity: Not Asked   Other Topics Concern  . None   Social History Narrative   Married x  38 yrs. Has BS degree in Horticulture from [employer]. Currently resides in a house with her husband. 3 cats.  Fun: used to like to do a lot of things.    Denies religious beliefs that would effect health care.      Current outpatient prescriptions:  .  ALPRAZolam (XANAX) 0.5 MG tablet, TAKE 1/2 TO 1 TABLET THREE TIMES DAILY AS NEEDED., Disp: 90 tablet, Rfl: 0 .  cyanocobalamin (,VITAMIN B-12,) 1000 MCG/ML injection, Inject 1,000 mcg into the muscle every 30 (thirty) days., Disp: , Rfl:  .  Doxylamine Succinate, Sleep, (UNISOM) 25 MG tablet, Take 25 mg by mouth at bedtime. , Disp: , Rfl:  .  DULoxetine (CYMBALTA) 30 MG capsule, Take 1 capsule (30 mg total) by mouth daily., Disp: 30 capsule, Rfl: 3 .  DULoxetine (CYMBALTA) 60 MG capsule, Take 1 capsule (60 mg total) by mouth daily., Disp: 30 capsule, Rfl: 3 .  HYDROcodone-acetaminophen (NORCO) 5-325 MG tablet, Take 1-2 tablets by mouth every 4 (four) hours as  needed., Disp: 30 tablet, Rfl: 0 .  hyoscyamine (LEVSIN SL) 0.125 MG SL tablet, Place 1 tablet (0.125 mg total) under the tongue every 4 (four) hours as needed for cramping., Disp: 60 tablet, Rfl: 3 .  morphine (MS CONTIN) 60 MG 12 hr tablet, Take 75 mg by mouth every 12 (twelve) hours. , Disp: , Rfl:  .  ondansetron (ZOFRAN) 4 MG tablet, 1 every 6 hours as needed for nausea, Disp: 30 tablet, Rfl: 0 .  pantoprazole (PROTONIX) 40 MG tablet, TAKE 1 TABLET 30 MINUTES BEFORE BREAKFAST, Disp: 30 tablet, Rfl: 6 .  zoledronic acid (RECLAST) 5 MG/100ML SOLN, Inject 5 mg into the vein once. Patient is due for one when she feels better, Disp: , Rfl:  .  benzonatate (TESSALON) 100 MG capsule, Take 1 capsule (100 mg total) by mouth 3 (three) times daily as needed for cough., Disp: 20 capsule, Rfl: 0 .  predniSONE (DELTASONE) 20 MG tablet, Take 2 tablets (40 mg total) by mouth daily with breakfast., Disp: 8 tablet, Rfl: 0  EXAM:  Filed Vitals:   01/13/16 1414  BP: 124/70  Pulse: 99  Temp: 97.8 F (36.6 C)    Body mass index is 21.37 kg/(m^2).  GENERAL: vitals reviewed and listed above, alert, oriented, appears well hydrated and in no acute distress  HEENT: atraumatic, conjunttiva clear, no obvious abnormalities on inspection of external nose and ears, normal appearance of ear canals and TMs, clear nasal congestion, mild post oropharyngeal erythema with PND, no tonsillar edema or exudate, no sinus TTP  NECK: no obvious masses on inspection  LUNGS: clear to auscultation bilaterally, no wheezes, rales or rhonchi, good air movement  CV: HRRR, no peripheral edema  MS: moves all extremities without noticeable abnormality  PSYCH: pleasant and cooperative, no obvious depression or anxiety  ASSESSMENT AND PLAN:  Discussed the following assessment and plan:  Acute upper respiratory infection  -given HPI and exam findings today, a serious infection or illness is unlikely. We discussed potential  etiologies, with VURI being most likely with likely mild bronchitis. She opted for prednisone and tessalon. Advised if worsening or persistent symptoms prompt re-eval. We discussed treatment side effects, likely course, antibiotic misuse, transmission, and signs of developing a serious illness. -of course, we advised to return or notify a doctor immediately if symptoms worsen or persist or new concerns arise.    There are no Patient Instructions on file for this visit.   Colin Benton R.

## 2016-02-06 ENCOUNTER — Other Ambulatory Visit: Payer: Self-pay | Admitting: Family

## 2016-02-06 NOTE — Telephone Encounter (Signed)
Last refill was 12/25/15

## 2016-02-07 ENCOUNTER — Ambulatory Visit: Payer: BLUE CROSS/BLUE SHIELD | Admitting: Nurse Practitioner

## 2016-02-07 ENCOUNTER — Ambulatory Visit: Payer: BLUE CROSS/BLUE SHIELD

## 2016-02-07 ENCOUNTER — Ambulatory Visit (INDEPENDENT_AMBULATORY_CARE_PROVIDER_SITE_OTHER): Payer: BLUE CROSS/BLUE SHIELD | Admitting: Nurse Practitioner

## 2016-02-07 VITALS — BP 124/84 | HR 72 | Temp 98.2°F | Resp 18 | Wt 125.0 lb

## 2016-02-07 DIAGNOSIS — E538 Deficiency of other specified B group vitamins: Secondary | ICD-10-CM

## 2016-02-07 DIAGNOSIS — R350 Frequency of micturition: Secondary | ICD-10-CM

## 2016-02-07 DIAGNOSIS — G4700 Insomnia, unspecified: Secondary | ICD-10-CM

## 2016-02-07 LAB — POCT URINALYSIS DIPSTICK
Bilirubin, UA: NEGATIVE
Blood, UA: NEGATIVE
Glucose, UA: NEGATIVE
Ketones, UA: NEGATIVE
Nitrite, UA: NEGATIVE
Protein, UA: NEGATIVE
Spec Grav, UA: 1.015
Urobilinogen, UA: 0.2
pH, UA: 7

## 2016-02-07 MED ORDER — CYANOCOBALAMIN 1000 MCG/ML IJ SOLN
1000.0000 ug | Freq: Once | INTRAMUSCULAR | Status: AC
  Administered 2016-02-07: 1000 ug via INTRAMUSCULAR

## 2016-02-07 NOTE — Progress Notes (Signed)
Pre visit review using our clinic review tool, if applicable. No additional management support is needed unless otherwise documented below in the visit note. 

## 2016-02-07 NOTE — Progress Notes (Signed)
Patient ID: Denise King, female    DOB: March 11, 1951  Age: 65 y.o. MRN: [id number]  CC: No chief complaint on file.   HPI [name] presents for CC urinary frequency for 1.5 months.   1) Urinary frequency x 1.5 months ago   1+ leukocytes  Goes every 1.5 to 2 hrs  Pain mgmt doctor says it could be that medication so they cut back on morphine  Water   2) Insomnia- waking up multiple times at night  Not napping  Unisom- not helpful  Melatonin- not helpful  Currently on benzos, ms contin, and norco?   History Aryanna has a past medical history of Unspecified chronic bronchitis ([place]); Tobacco use disorder; Reflux esophagitis; Diverticulosis of colon; Irritable bowel syndrome; colonic polyps; Acute cystitis; Back pain; Chronic pain syndrome; Osteopenia; Vertigo; Dysthymia; Depression; Vitamin B12 deficiency; Allergy; Anxiety; Arthritis; Blood transfusion; Hypertension; Osteoporosis; Gastritis; GERD (gastroesophageal reflux disease); Anemia; H/O chest pain (Dec. 2013); and Wears glasses.   She has past surgical history that includes Appendectomy; Tubal ligation; Back surgery (10-09); Tonsillectomy; laparoscopy (N/A, 02/21/2013); Colonoscopy; Upper gastrointestinal endoscopy; and Carpal tunnel release (Right, 08/14/2014).   Her family history includes Colon cancer in her brother and father; Heart disease in her mother; Prostate cancer in her father; Skin cancer in her sister. There is no history of Esophageal cancer, Rectal cancer, or Stomach cancer.She reports that she has been smoking Cigarettes.  She has a 30 pack-year smoking history. She has never used smokeless tobacco. She reports that she drinks alcohol. She reports that she does not use illicit drugs.  Outpatient Prescriptions Prior to Visit  Medication Sig Dispense Refill  . ALPRAZolam (XANAX) 0.5 MG tablet TAKE 1/2 TO 1 TABLET THREE TIMES DAILY AS NEEDED. 90 tablet 0  . cyanocobalamin (,VITAMIN B-12,) 1000 MCG/ML  injection Inject 1,000 mcg into the muscle every 30 (thirty) days.    . Doxylamine Succinate, Sleep, (UNISOM) 25 MG tablet Take 25 mg by mouth at bedtime.     . DULoxetine (CYMBALTA) 30 MG capsule Take 1 capsule (30 mg total) by mouth daily. 30 capsule 3  . DULoxetine (CYMBALTA) 60 MG capsule Take 1 capsule (60 mg total) by mouth daily. 30 capsule 3  . HYDROcodone-acetaminophen (NORCO) 5-325 MG tablet Take 1-2 tablets by mouth every 4 (four) hours as needed. 30 tablet 0  . hyoscyamine (LEVSIN SL) 0.125 MG SL tablet Place 1 tablet (0.125 mg total) under the tongue every 4 (four) hours as needed for cramping. 60 tablet 3  . morphine (MS CONTIN) 60 MG 12 hr tablet Take 75 mg by mouth every 12 (twelve) hours.     . ondansetron (ZOFRAN) 4 MG tablet 1 every 6 hours as needed for nausea 30 tablet 0  . pantoprazole (PROTONIX) 40 MG tablet TAKE 1 TABLET 30 MINUTES BEFORE BREAKFAST 30 tablet 6  . zoledronic acid (RECLAST) 5 MG/100ML SOLN Inject 5 mg into the vein once. Patient is due for one when she feels better    . benzonatate (TESSALON) 100 MG capsule Take 1 capsule (100 mg total) by mouth 3 (three) times daily as needed for cough. 20 capsule 0  . predniSONE (DELTASONE) 20 MG tablet Take 2 tablets (40 mg total) by mouth daily with breakfast. 8 tablet 0   No facility-administered medications prior to visit.    ROS Review of Systems  Constitutional: Negative for fever, chills, diaphoresis and fatigue.  Genitourinary: Positive for frequency. Negative for dysuria, urgency, hematuria, flank pain, decreased urine volume,  difficulty urinating and pelvic pain.  Skin: Negative for rash.  Psychiatric/Behavioral: Positive for sleep disturbance.    Objective:  BP 124/84 mmHg  Pulse 72  Temp(Src) 98.2 F (36.8 C) (Oral)  Resp 18  Wt 125 lb (56.7 kg)  SpO2 98%  Physical Exam  Constitutional: She is oriented to person, place, and time. She appears well-developed and well-nourished. No distress.  HENT:   Head: Normocephalic and atraumatic.  Right Ear: External ear normal.  Left Ear: External ear normal.  Abdominal: There is no CVA tenderness.  Neurological: She is alert and oriented to person, place, and time.  Skin: Skin is warm and dry. No rash noted. She is not diaphoretic.  Psychiatric: She has a normal mood and affect. Her behavior is normal. Judgment and thought content normal.   Assessment & Plan:   Diagnoses and all orders for this visit:  Vitamin B12 deficiency -     cyanocobalamin ((VITAMIN B-12)) injection 1,000 mcg; Inject 1 mL (1,000 mcg total) into the muscle once.  Urinary frequency -     POCT urinalysis dipstick   I have discontinued Ms. Kolenda's predniSONE and benzonatate. I am also having her maintain her zoledronic acid, doxylamine (Sleep), cyanocobalamin, pantoprazole, HYDROcodone-acetaminophen, ondansetron, morphine, DULoxetine, DULoxetine, hyoscyamine, and ALPRAZolam. We administered cyanocobalamin.  Meds ordered this encounter  Medications  . cyanocobalamin ((VITAMIN B-12)) injection 1,000 mcg    Sig:      Follow-up: Return if symptoms worsen or fail to improve.

## 2016-02-07 NOTE — Patient Instructions (Addendum)
Azo over the counter  Stop Unisom for 2-3 days  Try melatonin  Insomnia Insomnia is a sleep disorder that makes it difficult to fall asleep or to stay asleep. Insomnia can cause tiredness (fatigue), low energy, difficulty concentrating, mood swings, and poor performance at work or school.  There are three different ways to classify insomnia:  Difficulty falling asleep.  Difficulty staying asleep.  Waking up too early in the morning. Any type of insomnia can be long-term (chronic) or short-term (acute). Both are common. Short-term insomnia usually lasts for three months or less. Chronic insomnia occurs at least three times a week for longer than three months. CAUSES  Insomnia may be caused by another condition, situation, or substance, such as:  Anxiety.  Certain medicines.  Gastroesophageal reflux disease (GERD) or other gastrointestinal conditions.  Asthma or other breathing conditions.  Restless legs syndrome, sleep apnea, or other sleep disorders.  Chronic pain.  Menopause. This may include hot flashes.  Stroke.  Abuse of alcohol, tobacco, or illegal drugs.  Depression.  Caffeine.   Neurological disorders, such as Alzheimer disease.  An overactive thyroid (hyperthyroidism). The cause of insomnia may not be known. RISK FACTORS Risk factors for insomnia include:  Gender. Women are more commonly affected than men.  Age. Insomnia is more common as you get older.  Stress. This may involve your professional or personal life.  Income. Insomnia is more common in people with lower income.  Lack of exercise.   Irregular work schedule or night shifts.  Traveling between different time zones. SIGNS AND SYMPTOMS If you have insomnia, trouble falling asleep or trouble staying asleep is the main symptom. This may lead to other symptoms, such as:  Feeling fatigued.  Feeling nervous about going to sleep.  Not feeling rested in the morning.  Having trouble  concentrating.  Feeling irritable, anxious, or depressed. TREATMENT  Treatment for insomnia depends on the cause. If your insomnia is caused by an underlying condition, treatment will focus on addressing the condition. Treatment may also include:   Medicines to help you sleep.  Counseling or therapy.  Lifestyle adjustments. HOME CARE INSTRUCTIONS   Take medicines only as directed by your health care provider.  Keep regular sleeping and waking hours. Avoid naps.  Keep a sleep diary to help you and your health care provider figure out what could be causing your insomnia. Include:   When you sleep.  When you wake up during the night.  How well you sleep.   How rested you feel the next day.  Any side effects of medicines you are taking.  What you eat and drink.   Make your bedroom a comfortable place where it is easy to fall asleep:  Put up shades or special blackout curtains to block light from outside.  Use a white noise machine to block noise.  Keep the temperature cool.   Exercise regularly as directed by your health care provider. Avoid exercising right before bedtime.  Use relaxation techniques to manage stress. Ask your health care provider to suggest some techniques that may work well for you. These may include:  Breathing exercises.  Routines to release muscle tension.  Visualizing peaceful scenes.  Cut back on alcohol, caffeinated beverages, and cigarettes, especially close to bedtime. These can disrupt your sleep.  Do not overeat or eat spicy foods right before bedtime. This can lead to digestive discomfort that can make it hard for you to sleep.  Limit screen use before bedtime. This includes:  Watching  TV.  Using your smartphone, tablet, and computer.  Stick to a routine. This can help you fall asleep faster. Try to do a quiet activity, brush your teeth, and go to bed at the same time each night.  Get out of bed if you are still awake after 15  minutes of trying to sleep. Keep the lights down, but try reading or doing a quiet activity. When you feel sleepy, go back to bed.  Make sure that you drive carefully. Avoid driving if you feel very sleepy.  Keep all follow-up appointments as directed by your health care provider. This is important. SEEK MEDICAL CARE IF:   You are tired throughout the day or have trouble in your daily routine due to sleepiness.  You continue to have sleep problems or your sleep problems get worse. SEEK IMMEDIATE MEDICAL CARE IF:   You have serious thoughts about hurting yourself or someone else.   This information is not intended to replace advice given to you by your health care provider. Make sure you discuss any questions you have with your health care provider.   Document Released: 12/11/2000 Document Revised: 09/04/2015 Document Reviewed: 09/14/2014 Elsevier Interactive Patient Education [employer].

## 2016-02-11 ENCOUNTER — Encounter: Payer: Self-pay | Admitting: Nurse Practitioner

## 2016-02-11 DIAGNOSIS — R350 Frequency of micturition: Secondary | ICD-10-CM | POA: Insufficient documentation

## 2016-02-11 DIAGNOSIS — G4700 Insomnia, unspecified: Secondary | ICD-10-CM | POA: Insufficient documentation

## 2016-02-11 NOTE — Assessment & Plan Note (Signed)
Pt on multiple medications that may increase CNS depression Asked her to stop unisom for 2-3 days to see if there is improvement and add melatonin.  Follow up with pain management or PCP for sleep disturbance in future.

## 2016-02-11 NOTE — Assessment & Plan Note (Signed)
Pt has been seen for this before New onset recently POCT urinalysis unremarkable Advised water and Azo- follow up with PCP for possible urology referral. Pt would like to wait on this to see if reduced MS contin will be helpful.  FU prn worsening

## 2016-03-02 ENCOUNTER — Encounter: Payer: Self-pay | Admitting: Internal Medicine

## 2016-03-06 ENCOUNTER — Ambulatory Visit (INDEPENDENT_AMBULATORY_CARE_PROVIDER_SITE_OTHER): Payer: BLUE CROSS/BLUE SHIELD | Admitting: *Deleted

## 2016-03-06 DIAGNOSIS — E538 Deficiency of other specified B group vitamins: Secondary | ICD-10-CM

## 2016-03-06 MED ORDER — CYANOCOBALAMIN 1000 MCG/ML IJ SOLN
1000.0000 ug | Freq: Once | INTRAMUSCULAR | Status: AC
  Administered 2016-03-06: 1000 ug via INTRAMUSCULAR

## 2016-03-10 ENCOUNTER — Other Ambulatory Visit (INDEPENDENT_AMBULATORY_CARE_PROVIDER_SITE_OTHER): Payer: BLUE CROSS/BLUE SHIELD

## 2016-03-10 ENCOUNTER — Encounter: Payer: Self-pay | Admitting: Internal Medicine

## 2016-03-10 ENCOUNTER — Ambulatory Visit (INDEPENDENT_AMBULATORY_CARE_PROVIDER_SITE_OTHER): Payer: BLUE CROSS/BLUE SHIELD | Admitting: Internal Medicine

## 2016-03-10 ENCOUNTER — Telehealth: Payer: Self-pay | Admitting: Internal Medicine

## 2016-03-10 VITALS — BP 122/80 | HR 100 | Ht 63.0 in | Wt 123.1 lb

## 2016-03-10 DIAGNOSIS — R197 Diarrhea, unspecified: Secondary | ICD-10-CM

## 2016-03-10 DIAGNOSIS — Z800 Family history of malignant neoplasm of digestive organs: Secondary | ICD-10-CM

## 2016-03-10 LAB — CBC WITH DIFFERENTIAL/PLATELET
Basophils Absolute: 0 10*3/uL (ref 0.0–0.1)
Basophils Relative: 0.4 % (ref 0.0–3.0)
Eosinophils Absolute: 0.1 10*3/uL (ref 0.0–0.7)
Eosinophils Relative: 0.8 % (ref 0.0–5.0)
HCT: 42.1 % (ref 36.0–46.0)
Hemoglobin: 14.1 g/dL (ref 12.0–15.0)
Lymphocytes Relative: 38.1 % (ref 12.0–46.0)
Lymphs Abs: 3.7 10*3/uL (ref 0.7–4.0)
MCHC: 33.5 g/dL (ref 30.0–36.0)
MCV: 97.6 fl (ref 78.0–100.0)
Monocytes Absolute: 0.8 10*3/uL (ref 0.1–1.0)
Monocytes Relative: 8.5 % (ref 3.0–12.0)
Neutro Abs: 5.1 10*3/uL (ref 1.4–7.7)
Neutrophils Relative %: 52.2 % (ref 43.0–77.0)
Platelets: 360 10*3/uL (ref 150.0–400.0)
RBC: 4.32 Mil/uL (ref 3.87–5.11)
RDW: 13.1 % (ref 11.5–15.5)
WBC: 9.8 10*3/uL (ref 4.0–10.5)

## 2016-03-10 LAB — COMPREHENSIVE METABOLIC PANEL
ALT: 14 U/L (ref 0–35)
AST: 29 U/L (ref 0–37)
Albumin: 3.8 g/dL (ref 3.5–5.2)
Alkaline Phosphatase: 83 U/L (ref 39–117)
BUN: 4 mg/dL — ABNORMAL LOW (ref 6–23)
CO2: 28 mEq/L (ref 19–32)
Calcium: 8.8 mg/dL (ref 8.4–10.5)
Chloride: 102 mEq/L (ref 96–112)
Creatinine, Ser: 0.55 mg/dL (ref 0.40–1.20)
GFR: 118.73 mL/min (ref >60.00)
Glucose, Bld: 106 mg/dL — ABNORMAL HIGH (ref 70–99)
Potassium: 3.3 mEq/L — ABNORMAL LOW (ref 3.5–5.1)
Sodium: 139 mEq/L (ref 135–145)
Total Bilirubin: 0.5 mg/dL (ref 0.2–1.2)
Total Protein: 6.9 g/dL (ref 6.0–8.3)

## 2016-03-10 MED ORDER — DIPHENOXYLATE-ATROPINE 2.5-0.025 MG PO TABS: 1.0000 | ORAL_TABLET | Freq: Four times a day (QID) | ORAL | Status: DC | PRN

## 2016-03-10 NOTE — Telephone Encounter (Signed)
Patient continues with diarrhea 15-16 times a day.  She reports only other symptom is lack of appetite.  She has been taking lotronex with no improvement.  No sick contacts or recent antibiotics.  Please advise

## 2016-03-10 NOTE — Telephone Encounter (Signed)
Pt has been scheduled to see Dr Carlean Purl at 415 pm today.  She is aware

## 2016-03-10 NOTE — Telephone Encounter (Signed)
See if she can come in this afternoon

## 2016-03-10 NOTE — Patient Instructions (Signed)
   We have sent the following medications to your pharmacy for you to pick up at your convenience: Lomotil  Your physician has requested that you go to the basement for the following lab work before leaving today: C-diff stool test, CBC/diff, CMET    I appreciate the opportunity to care for you. Silvano Rusk, MD, [facility]

## 2016-03-10 NOTE — Progress Notes (Signed)
   Subjective:    Patient ID: MELISHA PRISK, female    DOB: 1951/04/16, 65 y.o.   MRN: [id number] Cc: diarrhea HPI Pleasant woman with hx [name] - D - has responmded to alosetron in past but it did cause some HA's. She has been having constipation more recently on MS Contin but in recent weeks developed diarrhea again. Restarted alosetron but she has not been helped and actually worsened w/ 10+ stools a day and even when sleeping. That is different from prior [name] problems. No fever. No recent Abx and no new meds  Medications, allergies, past medical history, past surgical history, family history and social history are reviewed and updated in the EMR  Review of Systems Back pain worsening lately, had steroid and Toradol injections into buttocks today Also having urinary frequency - no LE weakness    Objective:   Physical Exam BP 122/80 mmHg  Pulse 100  Ht 5\' 3"  (1.6 m)  Wt 123 lb 2 oz (55.849 kg)  BMI 21.82 kg/m2 In mild distress - back pain - moving slow abd is soft and [place] Rectal exam w/ Jackolyn Confer present - heme neg scanty brown stool and no impaction.        Assessment & Plan:  Diarrhea, unspecified type - Plan: Fecal lactoferrin, Comprehensive metabolic panel, CBC with Differential/Platelet, Clostridium Difficile by PCR  Family history of colon cancer - brother and father  I had ? If she might be impacted but no  Nocturnal stools suggests possible infection vs inflammatory condition  She has had + antigliadin Abs but duodenal bx negative as have been colon bxs (no colitis). Tjose exams several yrs ago  Have rxed Lomotil prn Will stop alosetron If stool tests neg then needs colonoscopy unless resolves I know she has [name] but this seems different   She has a FHx Colon cancer also brother and father and is 5 yrs 2 months from last colonoscopy so is going to need that regardless

## 2016-03-11 ENCOUNTER — Other Ambulatory Visit: Payer: BLUE CROSS/BLUE SHIELD

## 2016-03-11 DIAGNOSIS — R197 Diarrhea, unspecified: Secondary | ICD-10-CM

## 2016-03-11 NOTE — Progress Notes (Signed)
Quick Note:  Mild hypokalemia ______

## 2016-03-12 LAB — FECAL LACTOFERRIN, QUANT: Lactoferrin: NEGATIVE

## 2016-03-12 LAB — CLOSTRIDIUM DIFFICILE BY PCR: Toxigenic C. Difficile by PCR: NOT DETECTED

## 2016-03-12 NOTE — Progress Notes (Signed)
Quick Note:  C diff and lactoferrin are negative Needs to schedule a colonoscopy re: diarrhea unless it has resolved (i.e. Stopped and is not taking Lomotil) - ______

## 2016-03-13 NOTE — Progress Notes (Signed)
Quick Note:  OK Good - I think its her [name]   Will hold off on a colonoscopy ______

## 2016-03-17 ENCOUNTER — Other Ambulatory Visit: Payer: Self-pay | Admitting: Family

## 2016-03-18 NOTE — Telephone Encounter (Signed)
Faxed script back to [place].../lmb 

## 2016-03-20 ENCOUNTER — Encounter: Payer: Self-pay | Admitting: Internal Medicine

## 2016-03-20 ENCOUNTER — Other Ambulatory Visit: Payer: Self-pay | Admitting: Orthopaedic Surgery

## 2016-03-20 ENCOUNTER — Ambulatory Visit
Admission: RE | Admit: 2016-03-20 | Discharge: 2016-03-20 | Disposition: A | Discharge status: Home / Self Care | Payer: BLUE CROSS/BLUE SHIELD | Source: Ambulatory Visit | Attending: Orthopaedic Surgery | Admitting: Orthopaedic Surgery

## 2016-03-20 DIAGNOSIS — G894 Chronic pain syndrome: Secondary | ICD-10-CM

## 2016-03-30 ENCOUNTER — Encounter: Payer: Self-pay | Admitting: Internal Medicine

## 2016-03-30 ENCOUNTER — Telehealth: Payer: Self-pay

## 2016-03-30 NOTE — Telephone Encounter (Signed)
Left message for patient to call back  

## 2016-03-30 NOTE — Telephone Encounter (Signed)
-----   Message from Gatha Mayer, MD sent at 03/30/2016  1:15 PM EDT ----- Regarding: needs colonoscopy Need to arrange for a colonoscopy - hopefully sometime 1-2 weeks?  There is a 3 PM 4/10

## 2016-03-31 NOTE — Telephone Encounter (Signed)
Left message for patient to call back  

## 2016-04-02 NOTE — Telephone Encounter (Signed)
I spoke with the patient this am.  She wants to wait to schedule colonoscopy until she sees her pain doctor.  She will call me back when she is ready to schedule.

## 2016-04-07 ENCOUNTER — Ambulatory Visit: Payer: BLUE CROSS/BLUE SHIELD

## 2016-04-17 ENCOUNTER — Encounter: Payer: Self-pay | Admitting: Internal Medicine

## 2016-04-20 ENCOUNTER — Other Ambulatory Visit: Payer: Self-pay | Admitting: Family

## 2016-04-22 ENCOUNTER — Ambulatory Visit (INDEPENDENT_AMBULATORY_CARE_PROVIDER_SITE_OTHER): Payer: BLUE CROSS/BLUE SHIELD | Admitting: Family

## 2016-04-22 ENCOUNTER — Ambulatory Visit: Payer: Self-pay | Admitting: Family

## 2016-04-22 ENCOUNTER — Encounter: Payer: Self-pay | Admitting: Family

## 2016-04-22 VITALS — BP 132/80 | HR 98 | Temp 98.4°F | Resp 16 | Ht 63.0 in | Wt 122.8 lb

## 2016-04-22 DIAGNOSIS — M5440 Lumbago with sciatica, unspecified side: Secondary | ICD-10-CM | POA: Diagnosis not present

## 2016-04-22 DIAGNOSIS — E538 Deficiency of other specified B group vitamins: Secondary | ICD-10-CM

## 2016-04-22 DIAGNOSIS — R197 Diarrhea, unspecified: Secondary | ICD-10-CM

## 2016-04-22 MED ORDER — CYANOCOBALAMIN 1000 MCG/ML IJ SOLN
1000.0000 ug | Freq: Once | INTRAMUSCULAR | Status: AC
  Administered 2016-04-22: 1000 ug via INTRAMUSCULAR

## 2016-04-22 NOTE — Progress Notes (Signed)
Pre visit review using our clinic review tool, if applicable. No additional management support is needed unless otherwise documented below in the visit note. 

## 2016-04-22 NOTE — Assessment & Plan Note (Signed)
Diarrhea of undetermined origin with negative C. Difficile and refractory to Lomotil. No abdominal cramping. She is scheduled for a colonoscopy on Friday. Discussed possibility of starting Viberzi, however patient declined and would like to wait a colonoscopy. Advised to seek care if symptoms continue to worsen.

## 2016-04-22 NOTE — Assessment & Plan Note (Signed)
Symptoms most likely related to low back pain. She is on significant pain medications as managed through pain management and will be seeing them in 24 hours. Back Specialist indicated potential for a ganglion cyst which may be pressing on a nerve. Previous MRI of pelvis negative. Advised to follow-up with Back Specialist for further evaluation.

## 2016-04-22 NOTE — Progress Notes (Signed)
Subjective:    Patient ID: [name], female    DOB: October 05, 1951, 65 y.o.   MRN: [id number]  Chief Complaint  Patient presents with  . Diarrhea    x2 months of on going diarrhea, up to 10-12 times a day, feels like a knife is sticking in right hip all the time    HPI:  Denise King is a 65 y.o. female who  has a past medical history of Unspecified chronic bronchitis ([place]); Tobacco use disorder; Reflux esophagitis; Diverticulosis of colon; Irritable bowel syndrome; colonic polyps; Acute cystitis; Back pain; Chronic pain syndrome; Osteopenia; Vertigo; Dysthymia; Depression; Vitamin B12 deficiency; Allergy; Anxiety; Arthritis; Blood transfusion; Hypertension; Osteoporosis; Gastritis; GERD (gastroesophageal reflux disease); Anemia; H/O chest pain (Dec. 2013); and Wears glasses. and presents today for an office visit.  1.) Diarrhea - This is a new problem. Associated symptom of diarrhea with bowel movement frequency occuring up to 10-12 times per day has been going on for about 2 months. Does have history of [name]-D with no cramping noted with current episodes. Modifying factors include Lomotil which has not helped with her symptoms. She is scheduled to see gastroenterology tomorrow for a colonoscopy on Friday.   2.) Hip pain - Associated symptom of pain located in her right hip described as sharp has been going on for about 2 months. Has had MRI of the pelvis which was negative. Her pain physician is concerned about the potential of a ganglion cyst that is pushing on a nerve in her spine. Severity of the pain causes her to lie in bed most of the day. Modifying factors include a prednisone taper which has not helped with her symptoms. She is scheduled to see pain management tomorrow.   Allergies  Allergen Reactions  . Atorvastatin Nausea And Vomiting  . Levofloxacin     Reaction: achilles tendon pain  . Omnicef [Cefdinir] Diarrhea    Uncontrollable diarrhea  . Trazodone And  Nefazodone     "Felt like I was going to faint"  . Sulfonamide Derivatives Rash     Current Outpatient Prescriptions on File Prior to Visit  Medication Sig Dispense Refill  . ALPRAZolam (XANAX) 0.5 MG tablet TAKE 1/2 TO 1 TABLET THREE TIMES DAILY AS NEEDED. 90 tablet 0  . cyanocobalamin (,VITAMIN B-12,) 1000 MCG/ML injection Inject 1,000 mcg into the muscle every 30 (thirty) days.    . diphenoxylate-atropine (LOMOTIL) 2.5-0.025 MG tablet Take 1 tablet by mouth 4 (four) times daily as needed for diarrhea or loose stools (may take 2). 90 tablet 0  . Doxylamine Succinate, Sleep, (UNISOM) 25 MG tablet Take 25 mg by mouth at bedtime.     . DULoxetine (CYMBALTA) 60 MG capsule TAKE 1 CAPSULE DAILY. 30 capsule 3  . HYDROcodone-acetaminophen (NORCO) 5-325 MG tablet Take 1-2 tablets by mouth every 4 (four) hours as needed. 30 tablet 0  . hyoscyamine (LEVSIN SL) 0.125 MG SL tablet Place 1 tablet (0.125 mg total) under the tongue every 4 (four) hours as needed for cramping. 60 tablet 3  . morphine (MS CONTIN) 15 MG 12 hr tablet Take 15 mg by mouth every 12 (twelve) hours. Take with 60 mg to make 75 mg    . morphine (MS CONTIN) 60 MG 12 hr tablet Take 60 mg by mouth every 12 (twelve) hours. Take with 15 mg to make 75 mg    . ondansetron (ZOFRAN) 4 MG tablet 1 every 6 hours as needed for nausea 30 tablet 0  . pantoprazole (  PROTONIX) 40 MG tablet TAKE 1 TABLET 30 MINUTES BEFORE BREAKFAST 30 tablet 6  . zoledronic acid (RECLAST) 5 MG/100ML SOLN Inject 5 mg into the vein once.      No current facility-administered medications on file prior to visit.     Past Surgical History  Procedure Laterality Date  . Appendectomy    . Tubal ligation    . Back surgery  10-09    Dr. Patrice Paradise  . Tonsillectomy    . Laparoscopy N/A 02/21/2013    Procedure: LAPAROSCOPY OPERATIVE;  Surgeon: Margarette Asal, MD;  Location: [facility];  Service: Gynecology;  Laterality: N/A;  REQUESTING 5MM SCOPE WITH CAMERA  . Colonoscopy      . Upper gastrointestinal endoscopy    . Carpal tunnel release Right 08/14/2014    Procedure: RIGHT CARPAL TUNNEL RELEASE AND INJECT LEFT THUMB;  Surgeon: Daryll Brod, MD;  Location: [facility];  Service: Orthopedics;  Laterality: Right;      Review of Systems  Constitutional: Negative for fever and chills.  Gastrointestinal: Positive for diarrhea. Negative for nausea, vomiting, abdominal pain and blood in stool.  Musculoskeletal:       Positive for right hip pain  Neurological: Negative for weakness and numbness.      Objective:    BP 132/80 mmHg  Pulse 98  Temp(Src) 98.4 F (36.9 C) (Oral)  Resp 16  Ht 5\' 3"  (1.6 m)  Wt 122 lb 12.8 oz (55.702 kg)  BMI 21.76 kg/m2  SpO2 97% Nursing note and vital signs reviewed.  Physical Exam  Constitutional: She is oriented to person, place, and time. She appears well-developed and well-nourished. No distress.  Cardiovascular: Normal rate, regular rhythm, normal heart sounds and intact distal pulses.   Pulmonary/Chest: Effort normal and breath sounds normal.  Abdominal: Normal appearance and bowel sounds are normal. There is tenderness. There is no rigidity, no rebound, no guarding, no tenderness at McBurney's point and negative Murphy's sign.  Musculoskeletal:  Low back/hip - no obvious deformity, discoloration, or edema noted. Tenderness elicited inferior and lateral to sacroiliac joint. No masses noted. Range of motion deferred secondary to patient discomfort. Distal pulses and sensation are intact and appropriate.  Neurological: She is alert and oriented to person, place, and time.  Skin: Skin is warm and dry.  Psychiatric: She has a normal mood and affect. Her behavior is normal. Judgment and thought content normal.       Assessment & Plan:   Problem List Items Addressed This Visit      Other   Back pain    Symptoms most likely related to low back pain. She is on significant pain medications as managed through pain  management and will be seeing them in 24 hours. Back Specialist indicated potential for a ganglion cyst which may be pressing on a nerve. Previous MRI of pelvis negative. Advised to follow-up with Back Specialist for further evaluation.      Diarrhea - Primary    Diarrhea of undetermined origin with negative C. Difficile and refractory to Lomotil. No abdominal cramping. She is scheduled for a colonoscopy on Friday. Discussed possibility of starting Viberzi, however patient declined and would like to wait a colonoscopy. Advised to seek care if symptoms continue to worsen.       Other Visit Diagnoses    B12 deficiency        Relevant Medications    cyanocobalamin ((VITAMIN B-12)) injection 1,000 mcg (Completed)        I  am having Ms. Isidro maintain her zoledronic acid, doxylamine (Sleep), cyanocobalamin, pantoprazole, HYDROcodone-acetaminophen, ondansetron, morphine, hyoscyamine, morphine, diphenoxylate-atropine, ALPRAZolam, and DULoxetine. We administered cyanocobalamin.   Meds ordered this encounter  Medications  . cyanocobalamin ((VITAMIN B-12)) injection 1,000 mcg    Sig:      Follow-up: No Follow-up on file.  Mauricio Po, FNP

## 2016-04-22 NOTE — Patient Instructions (Addendum)
Thank you for choosing [facility].  Summary/Instructions:  Please continue to take your medications.   Follow up with Gastroenterology and the [facility] .  If your symptoms worsen or fail to improve, please contact our office for further instruction, or in case of emergency go directly to the emergency room at the closest medical facility.

## 2016-04-23 ENCOUNTER — Ambulatory Visit (AMBULATORY_SURGERY_CENTER): Payer: Self-pay

## 2016-04-23 VITALS — Ht 63.0 in | Wt 122.8 lb

## 2016-04-23 DIAGNOSIS — R197 Diarrhea, unspecified: Secondary | ICD-10-CM

## 2016-04-23 MED ORDER — POLYETHYLENE GLYCOL 3350 17 GM/SCOOP PO POWD: 1.0000 | Freq: Once | ORAL | Status: DC

## 2016-04-23 NOTE — Progress Notes (Signed)
No allergies to eggs or soy No past problems with anesthesia No home oxygen No diet meds  Has email and internet; refused emmi 

## 2016-04-24 ENCOUNTER — Other Ambulatory Visit (HOSPITAL_COMMUNITY): Payer: Self-pay | Admitting: Anesthesiology

## 2016-04-24 ENCOUNTER — Encounter: Payer: Self-pay | Admitting: Internal Medicine

## 2016-04-24 ENCOUNTER — Ambulatory Visit (AMBULATORY_SURGERY_CENTER): Payer: BLUE CROSS/BLUE SHIELD | Admitting: Internal Medicine

## 2016-04-24 VITALS — BP 133/69 | HR 89 | Temp 98.9°F | Resp 13 | Ht 63.0 in | Wt 122.0 lb

## 2016-04-24 DIAGNOSIS — R197 Diarrhea, unspecified: Secondary | ICD-10-CM | POA: Diagnosis present

## 2016-04-24 DIAGNOSIS — D122 Benign neoplasm of ascending colon: Secondary | ICD-10-CM

## 2016-04-24 DIAGNOSIS — R1032 Left lower quadrant pain: Secondary | ICD-10-CM

## 2016-04-24 DIAGNOSIS — M533 Sacrococcygeal disorders, not elsewhere classified: Secondary | ICD-10-CM

## 2016-04-24 MED ORDER — SODIUM CHLORIDE 0.9 % IV SOLN: 500.0000 mL | INTRAVENOUS | Status: DC

## 2016-04-24 MED ORDER — FENTANYL CITRATE (PF) 100 MCG/2ML IJ SOLN
25.0000 ug | Freq: Once | INTRAMUSCULAR | Status: AC
  Administered 2016-04-24: 25 ug via INTRAVENOUS

## 2016-04-24 NOTE — Patient Instructions (Addendum)
I removed one tiny poly. Colon lining looks ok - I took biopsies. I will call with results and new treatment recommendations.   Gatha Mayer, MD, FACG  YOU HAD AN ENDOSCOPIC PROCEDURE TODAY AT [facility]:   Refer to the procedure report that was given to you for any specific questions about what was found during the examination.  If the procedure report does not answer your questions, please call your gastroenterologist to clarify.  If you requested that your care partner not be given the details of your procedure findings, then the procedure report has been included in a sealed envelope for you to review at your convenience later.  YOU SHOULD EXPECT: Some feelings of bloating in the abdomen. Passage of more gas than usual.  Walking can help get rid of the air that was put into your GI tract during the procedure and reduce the bloating. If you had a lower endoscopy (such as a colonoscopy or flexible sigmoidoscopy) you may notice spotting of blood in your stool or on the toilet paper. If you underwent a bowel prep for your procedure, you may not have a normal bowel movement for a few days.  Please Note:  You might notice some irritation and congestion in your nose or some drainage.  This is from the oxygen used during your procedure.  There is no need for concern and it should clear up in a day or so.  SYMPTOMS TO REPORT IMMEDIATELY:   Following lower endoscopy (colonoscopy or flexible sigmoidoscopy):  Excessive amounts of blood in the stool  Significant tenderness or worsening of abdominal pains  Swelling of the abdomen that is new, acute  Fever of 100F or higher   For urgent or emergent issues, a gastroenterologist can be reached at any hour by calling [phone].   DIET: Your first meal following the procedure should be a small meal and then it is ok to progress to your normal diet. Heavy or fried foods are harder to digest and may make you feel nauseous  or bloated.  Likewise, meals heavy in dairy and vegetables can increase bloating.  Drink plenty of fluids but you should avoid alcoholic beverages for 24 hours.  ACTIVITY:  You should plan to take it easy for the rest of today and you should NOT DRIVE or use heavy machinery until tomorrow (because of the sedation medicines used during the test).    FOLLOW UP: Our staff will call the number listed on your records the next business day following your procedure to check on you and address any questions or concerns that you may have regarding the information given to you following your procedure. If we do not reach you, we will leave a message.  However, if you are feeling well and you are not experiencing any problems, there is no need to return our call.  We will assume that you have returned to your regular daily activities without incident.  If any biopsies were taken you will be contacted by phone or by letter within the next 1-3 weeks.  Please call us at [phone] if you have not heard about the biopsies in 3 weeks.    SIGNATURES/CONFIDENTIALITY: You and/or your care partner have signed paperwork which will be entered into your electronic medical record.  These signatures attest to the fact that that the information above on your After Visit Summary has been reviewed and is understood.  Full responsibility of the confidentiality of this discharge information  lies with you and/or your care-partner.  Polyp, diverticulosis-handout given.

## 2016-04-24 NOTE — Progress Notes (Signed)
Patient awakening,vss,report to rn 

## 2016-04-24 NOTE — Progress Notes (Signed)
Called to room to assist during endoscopic procedure.  Patient ID and intended procedure confirmed with present staff. Received instructions for my participation in the procedure from the performing physician.  

## 2016-04-24 NOTE — Op Note (Signed)
[facility] Patient Name: Denise King Procedure Date: 04/24/2016 9:25 AM MRN: [id number] Endoscopist: Gatha Mayer , MD Age: 65 Date of Birth: Apr 03, 1951 Gender: Female Procedure:                Colonoscopy Indications:              Clinically significant diarrhea of unexplained                            origin, Family history of colon cancer in a                            first-degree relative Medicines:                Propofol per Anesthesia, Monitored Anesthesia Care Procedure:                Pre-Anesthesia Assessment:                           - Prior to the procedure, a History and Physical                            was performed, and patient medications and                            allergies were reviewed. The patient's tolerance of                            previous anesthesia was also reviewed. The risks                            and benefits of the procedure and the sedation                            options and risks were discussed with the patient.                            All questions were answered, and informed consent                            was obtained. Prior Anticoagulants: The patient has                            taken no previous anticoagulant or antiplatelet                            agents. ASA Grade Assessment: II - A patient with                            mild systemic disease. After reviewing the risks                            and benefits, the patient was deemed in  satisfactory condition to undergo the procedure.                           After obtaining informed consent, the colonoscope                            was passed under direct vision. Throughout the                            procedure, the patient's blood pressure, pulse, and                            oxygen saturations were monitored continuously. The                            Model PCF-[id number]) scope was introduced                         through the anus and advanced to the the terminal                            ileum, with identification of the appendiceal                            orifice and IC valve. The colonoscopy was performed                            without difficulty. The patient tolerated the                            procedure well. The quality of the bowel                            preparation was excellent. The bowel preparation                            used was Miralax. The ileocecal valve, appendiceal                            orifice, and rectum were photographed. Scope In: 9:40:28 AM Scope Out: 9:54:37 AM Scope Withdrawal Time: 0 hours 11 minutes 33 seconds  Total Procedure Duration: 0 hours 14 minutes 9 seconds  Findings:                 The perianal and digital rectal examinations were                            normal.                           The terminal ileum appeared normal.                           A 4 mm polyp was found in the ascending colon. The  polyp was sessile. The polyp was removed with a                            cold snare. Resection and retrieval were complete.                            Verification of patient identification for the                            specimen was done. Estimated blood loss was minimal.                           Normal mucosa was found in the entire colon.                            Biopsies for histology were taken with a cold                            forceps from the rectum and entire colon for                            evaluation of microscopic colitis. Verification of                            patient identification for the specimen was done.                            Estimated blood loss was minimal.                           Multiple diverticula were found in the sigmoid                            colon. There was no evidence of diverticular                            bleeding.                            No additional abnormalities were found on                            retroflexion. Complications:            No immediate complications. Estimated Blood Loss:     Estimated blood loss was minimal. Impression:               - The examined portion of the ileum was normal.                           - One 4 mm polyp in the ascending colon, removed                            with a cold snare. Resected and retrieved.                           -  Normal mucosa in the entire examined colon.                            Biopsied.                           - Mild diverticulosis in the sigmoid colon. There                            was no evidence of diverticular bleeding. Recommendation:           - Patient has a contact number available for                            emergencies. The signs and symptoms of potential                            delayed complications were discussed with the                            patient. Return to normal activities tomorrow.                            Written discharge instructions were provided to the                            patient.                           - Resume previous diet.                           - Patient has a contact number available for                            emergencies. The signs and symptoms of potential                            delayed complications were discussed with the                            patient. Return to normal activities tomorrow.                            Written discharge instructions were provided to the                            patient.                           - Resume previous diet.                           - Continue present medications.                           - Repeat colonoscopy is recommended  for                            surveillance. The colonoscopy date will be                            determined after pathology results from today's                            exam become available  for review.                           - WILL CONTACT WITH RESULTS - PROBABLY BAD [name] BUT                            RECHECKING FOR MICROSCOPIC COLITIS - NOT RESPONDING                            TO ALOSETRON AND IS ON CHRONIC NARCOTICS SO PUZZLING Gatha Mayer, MD 04/24/2016 10:04:22 AM This report has been signed electronically.

## 2016-04-27 ENCOUNTER — Telehealth: Payer: Self-pay | Admitting: *Deleted

## 2016-04-27 NOTE — Telephone Encounter (Signed)
  Follow up Call-  Call back number 04/24/2016 02/20/2014  Post procedure Call Back phone  # [phone]  Permission to leave phone message Yes Yes     Patient questions:  Do you have a fever, pain , or abdominal swelling? No. Pain Score  0 *  Have you tolerated food without any problems? Yes.    Have you been able to return to your normal activities? Yes.    Do you have any questions about your discharge instructions: Diet   No. Medications  No. Follow up visit  No.  Do you have questions or concerns about your Care? No.  Actions: * If pain score is 4 or above: No action needed, pain <4.

## 2016-04-30 ENCOUNTER — Telehealth: Payer: Self-pay

## 2016-04-30 ENCOUNTER — Telehealth: Payer: Self-pay | Admitting: Family

## 2016-04-30 NOTE — Telephone Encounter (Signed)
Requesting med refill of xanax. Last refill was 03/17/16

## 2016-05-01 ENCOUNTER — Encounter (HOSPITAL_COMMUNITY)
Admission: RE | Admit: 2016-05-01 | Discharge: 2016-05-01 | Disposition: A | Discharge status: Home / Self Care | Payer: BLUE CROSS/BLUE SHIELD | Source: Ambulatory Visit | Attending: Anesthesiology | Admitting: Anesthesiology

## 2016-05-01 DIAGNOSIS — M533 Sacrococcygeal disorders, not elsewhere classified: Secondary | ICD-10-CM | POA: Insufficient documentation

## 2016-05-01 MED ORDER — TECHNETIUM TC 99M MEDRONATE IV KIT
25.0000 | PACK | Freq: Once | INTRAVENOUS | Status: AC | PRN
  Administered 2016-05-01: 25 via INTRAVENOUS

## 2016-05-01 MED ORDER — ALPRAZOLAM 0.5 MG PO TABS: ORAL_TABLET | ORAL | Status: DC

## 2016-05-01 NOTE — Telephone Encounter (Signed)
Rx called into [name] had to leave on pharmacy vm...Johny Chess

## 2016-05-01 NOTE — Addendum Note (Signed)
Addended by: Earnstine Regal on: 05/01/2016 10:15 AM   Modules accepted: Orders

## 2016-05-01 NOTE — Telephone Encounter (Signed)
Please call medication in. Thank you.

## 2016-05-03 ENCOUNTER — Encounter: Payer: Self-pay | Admitting: Internal Medicine

## 2016-05-03 NOTE — Progress Notes (Signed)
Quick Note:  Calling from office needs 5 yr colon recall due to polyp ______

## 2016-05-03 NOTE — Progress Notes (Signed)
Quick Note:  Small adenoma Normal colon bxs Ask how diarrhea is please Will need 5 yrs colon recall - hx polyp ______

## 2016-05-18 ENCOUNTER — Encounter: Payer: Self-pay | Admitting: Family

## 2016-05-20 ENCOUNTER — Ambulatory Visit (INDEPENDENT_AMBULATORY_CARE_PROVIDER_SITE_OTHER): Payer: BLUE CROSS/BLUE SHIELD

## 2016-05-20 DIAGNOSIS — E538 Deficiency of other specified B group vitamins: Secondary | ICD-10-CM

## 2016-05-20 MED ORDER — CYANOCOBALAMIN 1000 MCG/ML IJ SOLN
1000.0000 ug | Freq: Once | INTRAMUSCULAR | Status: AC
  Administered 2016-05-20: 1000 ug via INTRAMUSCULAR

## 2016-05-28 IMAGING — CR DG CHEST 2V
2 series · 2 of 2 positions shown · non-contrast
Comparison: 08/15/2013.

CLINICAL DATA: Chest pain.

EXAM:
CHEST  2 VIEW

[w chest pa]
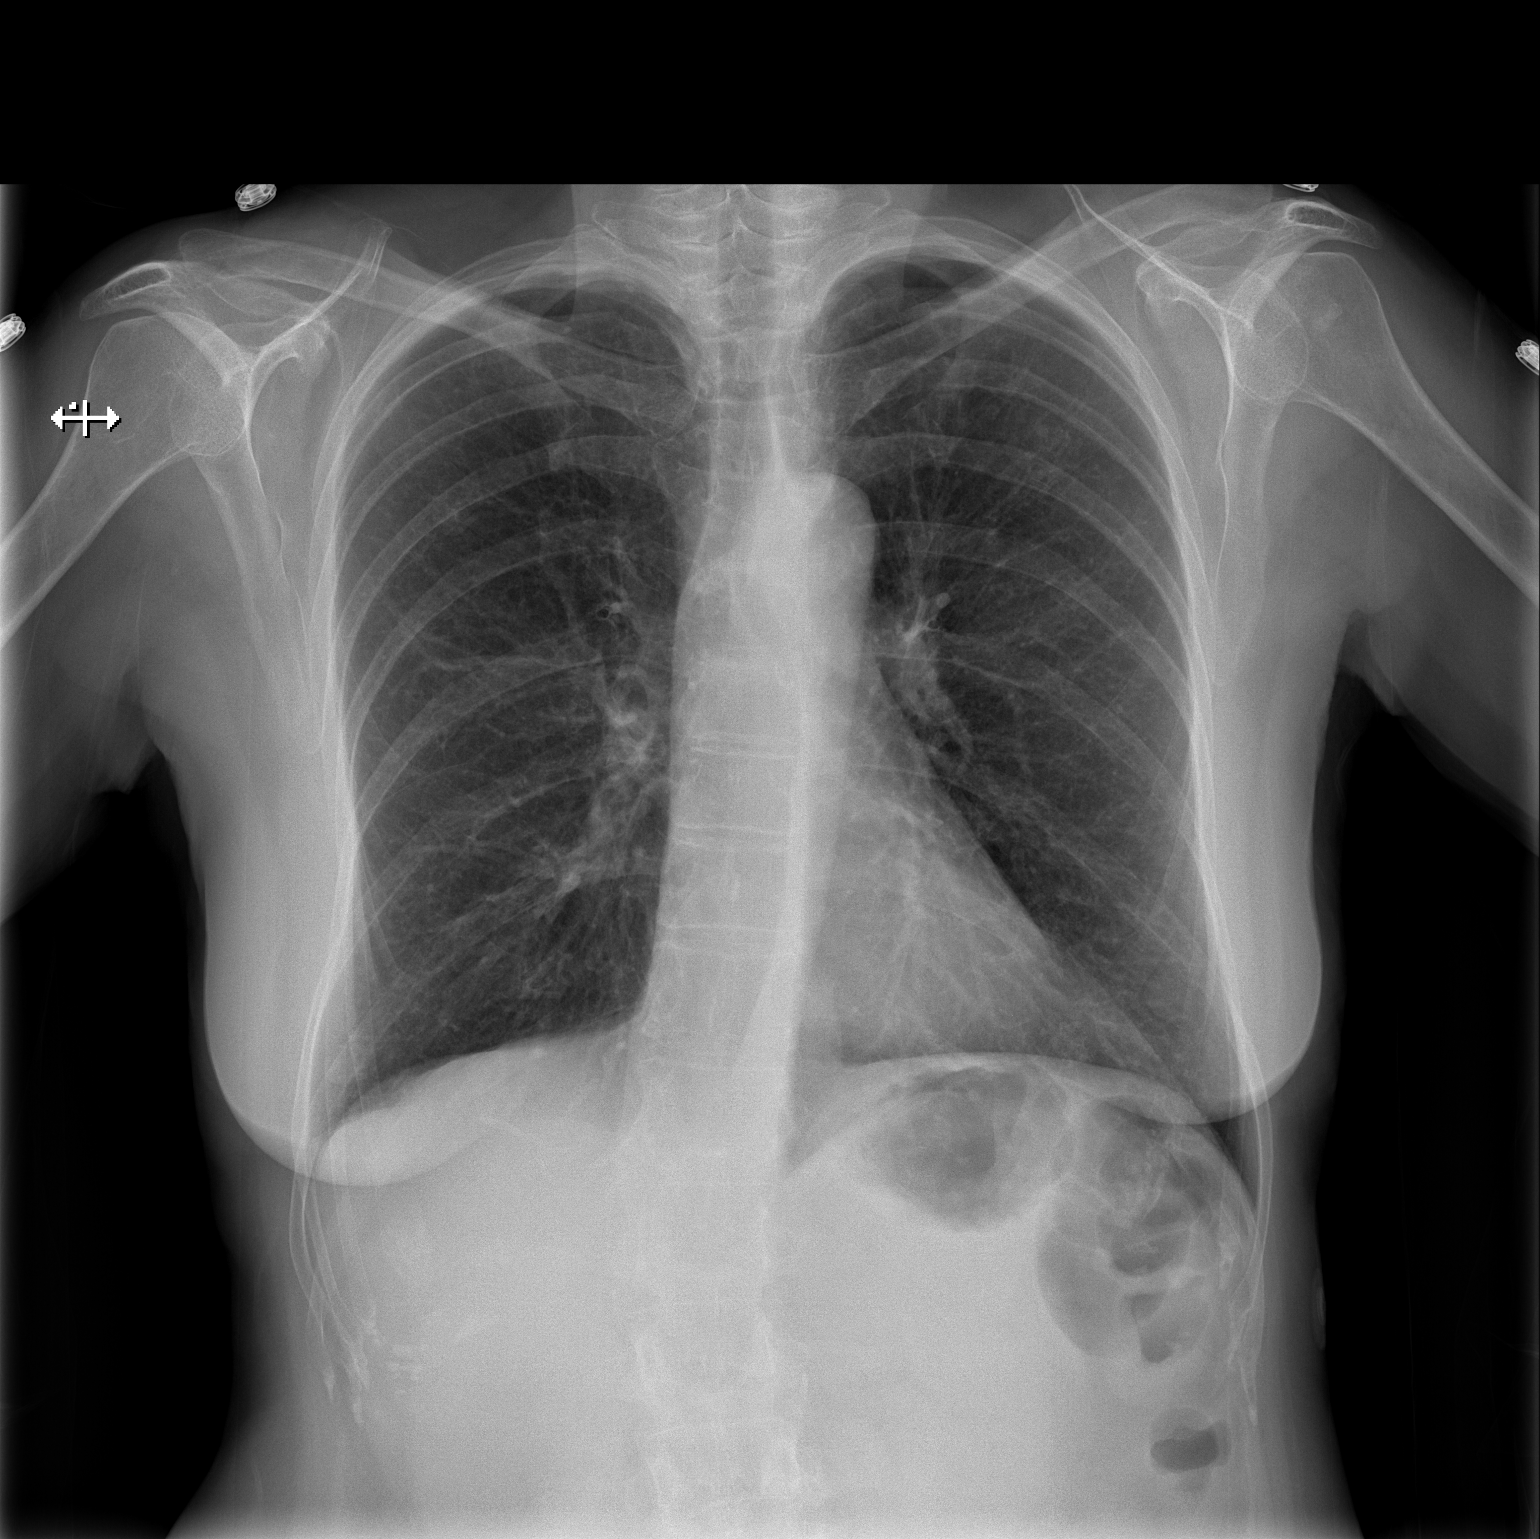

[w chest lat]
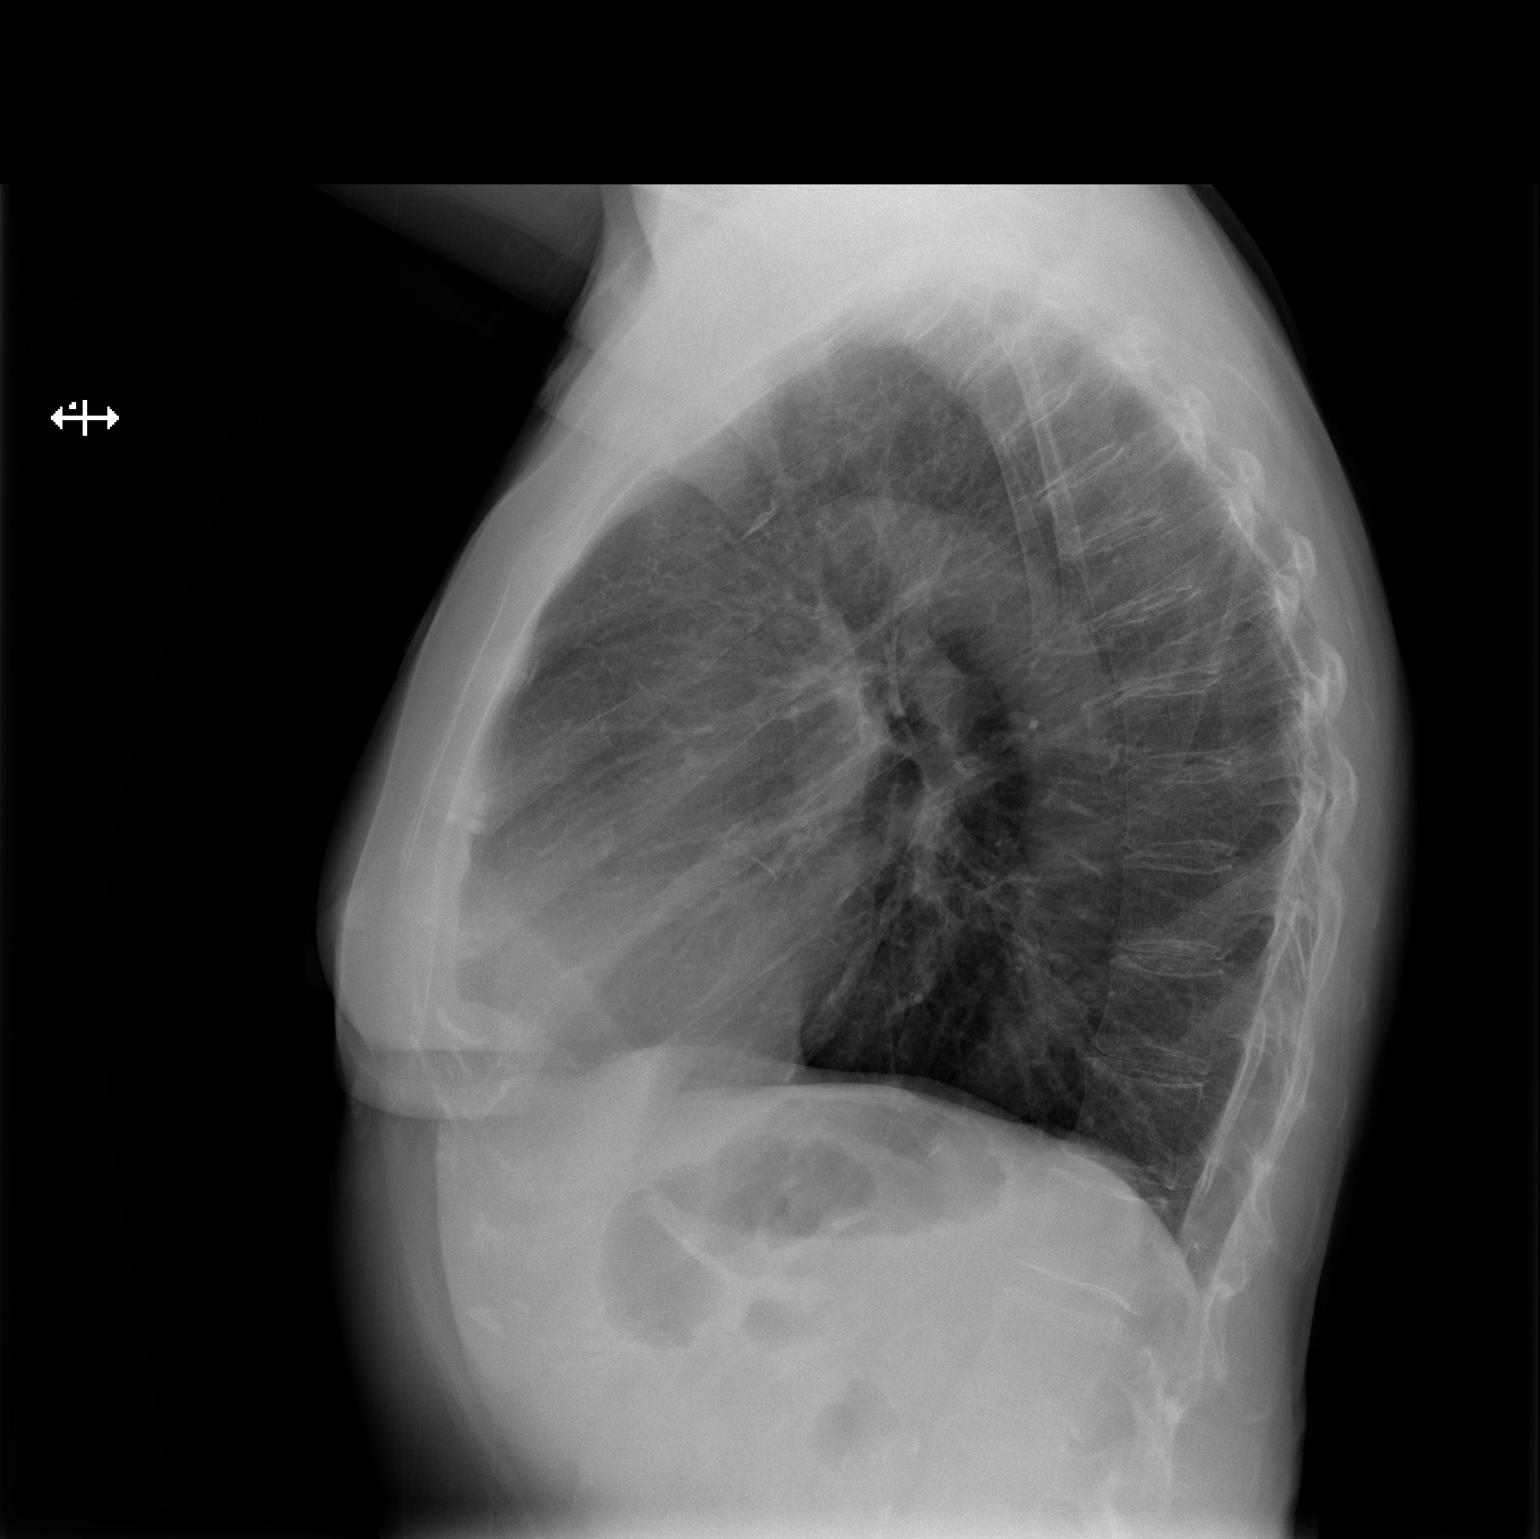

[2 of 2 positions shown; findings below may reference images not displayed]

FINDINGS: The cardiac silhouette, mediastinal and hilar contours are within
normal limits and stable. The lungs are clear. No pleural effusion.
The bony thorax is intact.
IMPRESSION: No acute cardiopulmonary findings.

## 2016-06-09 ENCOUNTER — Other Ambulatory Visit: Payer: Self-pay | Admitting: Family

## 2016-06-09 NOTE — Telephone Encounter (Signed)
Last refill was 05/01/16

## 2016-06-16 ENCOUNTER — Ambulatory Visit (INDEPENDENT_AMBULATORY_CARE_PROVIDER_SITE_OTHER): Payer: BLUE CROSS/BLUE SHIELD

## 2016-06-16 DIAGNOSIS — E538 Deficiency of other specified B group vitamins: Secondary | ICD-10-CM

## 2016-06-16 MED ORDER — CYANOCOBALAMIN 1000 MCG/ML IJ SOLN
1000.0000 ug | Freq: Once | INTRAMUSCULAR | Status: AC
  Administered 2016-06-16: 1000 ug via INTRAMUSCULAR

## 2016-06-17 ENCOUNTER — Ambulatory Visit: Payer: BLUE CROSS/BLUE SHIELD

## 2016-07-03 ENCOUNTER — Encounter (HOSPITAL_COMMUNITY): Payer: Self-pay | Admitting: Emergency Medicine

## 2016-07-03 ENCOUNTER — Emergency Department (HOSPITAL_COMMUNITY): Payer: BLUE CROSS/BLUE SHIELD

## 2016-07-03 ENCOUNTER — Emergency Department (HOSPITAL_COMMUNITY)
Admission: EM | Admit: 2016-07-03 | Discharge: 2016-07-03 | Disposition: A | Discharge status: Home / Self Care | Payer: BLUE CROSS/BLUE SHIELD | Attending: Emergency Medicine | Admitting: Emergency Medicine

## 2016-07-03 DIAGNOSIS — Z86010 Personal history of colonic polyps: Secondary | ICD-10-CM | POA: Diagnosis not present

## 2016-07-03 DIAGNOSIS — F17210 Nicotine dependence, cigarettes, uncomplicated: Secondary | ICD-10-CM | POA: Insufficient documentation

## 2016-07-03 DIAGNOSIS — M5136 Other intervertebral disc degeneration, lumbar region: Secondary | ICD-10-CM | POA: Diagnosis not present

## 2016-07-03 DIAGNOSIS — Z79899 Other long term (current) drug therapy: Secondary | ICD-10-CM | POA: Insufficient documentation

## 2016-07-03 DIAGNOSIS — I10 Essential (primary) hypertension: Secondary | ICD-10-CM | POA: Diagnosis not present

## 2016-07-03 DIAGNOSIS — F329 Major depressive disorder, single episode, unspecified: Secondary | ICD-10-CM | POA: Diagnosis not present

## 2016-07-03 DIAGNOSIS — M1990 Unspecified osteoarthritis, unspecified site: Secondary | ICD-10-CM | POA: Diagnosis not present

## 2016-07-03 DIAGNOSIS — M419 Scoliosis, unspecified: Secondary | ICD-10-CM | POA: Insufficient documentation

## 2016-07-03 DIAGNOSIS — M5431 Sciatica, right side: Secondary | ICD-10-CM

## 2016-07-03 DIAGNOSIS — M5441 Lumbago with sciatica, right side: Secondary | ICD-10-CM | POA: Diagnosis not present

## 2016-07-03 DIAGNOSIS — R109 Unspecified abdominal pain: Secondary | ICD-10-CM | POA: Diagnosis not present

## 2016-07-03 DIAGNOSIS — Z8719 Personal history of other diseases of the digestive system: Secondary | ICD-10-CM | POA: Insufficient documentation

## 2016-07-03 DIAGNOSIS — M810 Age-related osteoporosis without current pathological fracture: Secondary | ICD-10-CM | POA: Insufficient documentation

## 2016-07-03 DIAGNOSIS — M545 Low back pain: Secondary | ICD-10-CM | POA: Diagnosis present

## 2016-07-03 MED ORDER — DIAZEPAM 5 MG PO TABS
5.0000 mg | ORAL_TABLET | Freq: Once | ORAL | Status: AC
  Administered 2016-07-03: 5 mg via ORAL
  Filled 2016-07-03: qty 1

## 2016-07-03 MED ORDER — ACETAMINOPHEN 500 MG PO TABS
1000.0000 mg | ORAL_TABLET | Freq: Once | ORAL | Status: DC
Start: 2016-07-03 — End: 2016-07-03
  Filled 2016-07-03: qty 2

## 2016-07-03 MED ORDER — IBUPROFEN 800 MG PO TABS
800.0000 mg | ORAL_TABLET | Freq: Once | ORAL | Status: AC
  Administered 2016-07-03: 800 mg via ORAL
  Filled 2016-07-03: qty 1

## 2016-07-03 MED ORDER — HYDROMORPHONE HCL 1 MG/ML IJ SOLN
1.0000 mg | Freq: Once | INTRAMUSCULAR | Status: AC
  Administered 2016-07-03: 1 mg via INTRAMUSCULAR
  Filled 2016-07-03: qty 1

## 2016-07-03 MED ORDER — HYDROMORPHONE HCL 1 MG/ML IJ SOLN
1.0000 mg | Freq: Once | INTRAMUSCULAR | Status: AC
Start: 2016-07-03 — End: 2016-07-03
  Administered 2016-07-03: 1 mg via INTRAVENOUS
  Filled 2016-07-03: qty 1

## 2016-07-03 NOTE — ED Notes (Signed)
Patient c/o right hip pain x 2 nights ago. Patient states that she had previous back surgery and has been told that her hardware is going bad. Patient has had MRI nuclear bone scan, xrays but not Ct scan.  Patient took Morphine ER this morning around 7am.

## 2016-07-03 NOTE — Discharge Instructions (Signed)
You were given tylenol, toradol, dilaudid for your pain.  Discuss with your pain management doctor if they feel its appropriate for a change in your medication with this acute worsening of your chronic back pain.  Sciatica Sciatica is pain, weakness, numbness, or tingling along the path of the sciatic nerve. The nerve starts in the lower back and runs down the back of each leg. The nerve controls the muscles in the lower leg and in the back of the knee, while also providing sensation to the back of the thigh, lower leg, and the sole of your foot. Sciatica is a symptom of another medical condition. For instance, nerve damage or certain conditions, such as a herniated disk or bone spur on the spine, pinch or put pressure on the sciatic nerve. This causes the pain, weakness, or other sensations normally associated with sciatica. Generally, sciatica only affects one side of the body. CAUSES   Herniated or slipped disc.  Degenerative disk disease.  A pain disorder involving the narrow muscle in the buttocks (piriformis syndrome).  Pelvic injury or fracture.  Pregnancy.  Tumor (rare). SYMPTOMS  Symptoms can vary from mild to very severe. The symptoms usually travel from the low back to the buttocks and down the back of the leg. Symptoms can include:  Mild tingling or dull aches in the lower back, leg, or hip.  Numbness in the back of the calf or sole of the foot.  Burning sensations in the lower back, leg, or hip.  Sharp pains in the lower back, leg, or hip.  Leg weakness.  Severe back pain inhibiting movement. These symptoms may get worse with coughing, sneezing, laughing, or prolonged sitting or standing. Also, being overweight may worsen symptoms. DIAGNOSIS  Your caregiver will perform a physical exam to look for common symptoms of sciatica. He or she may ask you to do certain movements or activities that would trigger sciatic nerve pain. Other tests may be performed to find the cause of  the sciatica. These may include:  Blood tests.  X-rays.  Imaging tests, such as an MRI or CT scan. TREATMENT  Treatment is directed at the cause of the sciatic pain. Sometimes, treatment is not necessary and the pain and discomfort goes away on its own. If treatment is needed, your caregiver may suggest:  Over-the-counter medicines to relieve pain.  Prescription medicines, such as anti-inflammatory medicine, muscle relaxants, or narcotics.  Applying heat or ice to the painful area.  Steroid injections to lessen pain, irritation, and inflammation around the nerve.  Reducing activity during periods of pain.  Exercising and stretching to strengthen your abdomen and improve flexibility of your spine. Your caregiver may suggest losing weight if the extra weight makes the back pain worse.  Physical therapy.  Surgery to eliminate what is pressing or pinching the nerve, such as a bone spur or part of a herniated disk. HOME CARE INSTRUCTIONS   Only take over-the-counter or prescription medicines for pain or discomfort as directed by your caregiver.  Apply ice to the affected area for 20 minutes, 3-4 times a day for the first 48-72 hours. Then try heat in the same way.  Exercise, stretch, or perform your usual activities if these do not aggravate your pain.  Attend physical therapy sessions as directed by your caregiver.  Keep all follow-up appointments as directed by your caregiver.  Do not wear high heels or shoes that do not provide proper support.  Check your mattress to see if it is too  soft. A firm mattress may lessen your pain and discomfort. SEEK IMMEDIATE MEDICAL CARE IF:   You lose control of your bowel or bladder (incontinence).  You have increasing weakness in the lower back, pelvis, buttocks, or legs.  You have redness or swelling of your back.  You have a burning sensation when you urinate.  You have pain that gets worse when you lie down or awakens you at  night.  Your pain is worse than you have experienced in the past.  Your pain is lasting longer than 4 weeks.  You are suddenly losing weight without reason. MAKE SURE YOU:  Understand these instructions.  Will watch your condition.  Will get help right away if you are not doing well or get worse.   This information is not intended to replace advice given to you by your health care provider. Make sure you discuss any questions you have with your health care provider.   Document Released: 12/08/2001 Document Revised: 09/04/2015 Document Reviewed: 04/24/2012 Elsevier Interactive Patient Education [employer].

## 2016-07-03 NOTE — ED Provider Notes (Signed)
CSN: [id number]     Arrival date & time 07/03/16  0912 History   First MD Initiated Contact with Patient 07/03/16 0920     Chief Complaint  Patient presents with  . Hip Pain     (Consider location/radiation/quality/duration/timing/severity/associated sxs/prior Treatment) Patient is a 65 y.o. female presenting with hip pain. The history is provided by the patient.  Hip Pain This is a new problem. The current episode started less than 1 hour ago. The problem occurs constantly. The problem has not changed since onset.Pertinent negatives include no chest pain, no headaches and no shortness of breath. Nothing aggravates the symptoms. Nothing relieves the symptoms. She has tried nothing for the symptoms. The treatment provided no relief.    65 yo F with right lower back pain.  Going on for past two or three days.  Patient with hx of chronic back pain, sees spinal surgeon and pain mgmt for this.  Denies recent injury, denies fevers or chills.  Has recently been gardening more and thinks this may have worsening things.   Past Medical History  Diagnosis Date  . Unspecified chronic bronchitis ([place])   . Tobacco use disorder   . Reflux esophagitis   . Diverticulosis of colon   . Irritable bowel syndrome   . Hx of colonic polyps   . Acute cystitis   . Back pain   . Chronic pain syndrome   . Osteopenia   . Vertigo   . Dysthymia   . Depression   . Vitamin B12 deficiency   . Allergy     as a child grew out of them  . Anxiety   . Arthritis   . Blood transfusion   . Hypertension   . Osteoporosis   . Gastritis   . GERD (gastroesophageal reflux disease)   . Anemia     pernicious anemia  . H/O chest pain Dec. 2013    no work up done  . Wears glasses    Past Surgical History  Procedure Laterality Date  . Appendectomy    . Tubal ligation    . Back surgery  10-09    Dr. Patrice Paradise  . Tonsillectomy    . Laparoscopy N/A 02/21/2013    Procedure: LAPAROSCOPY OPERATIVE;  Surgeon: Margarette Asal, MD;  Location: [facility];  Service: Gynecology;  Laterality: N/A;  REQUESTING 5MM SCOPE WITH CAMERA  . Colonoscopy    . Upper gastrointestinal endoscopy    . Carpal tunnel release Right 08/14/2014    Procedure: RIGHT CARPAL TUNNEL RELEASE AND INJECT LEFT THUMB;  Surgeon: Daryll Brod, MD;  Location: [facility];  Service: Orthopedics;  Laterality: Right;   Family History  Problem Relation Age of Onset  . Colon cancer Father   . Prostate cancer Father   . Heart disease Mother     prev MVR, also has DJD  . Colon cancer Brother   . Skin cancer Sister   . Esophageal cancer Neg Hx   . Rectal cancer Neg Hx   . Stomach cancer Neg Hx    Social History  Substance Use Topics  . Smoking status: Current Every Day Smoker -- 1.00 packs/day for 30 years    Types: Cigarettes  . Smokeless tobacco: Never Used  . Alcohol Use: 0.0 oz/week    0 Standard drinks or equivalent per week     Comment: occasional   OB History    No data available     Review of Systems  Constitutional: Negative for  fever and chills.  HENT: Negative for congestion and rhinorrhea.   Eyes: Negative for redness and visual disturbance.  Respiratory: Negative for shortness of breath and wheezing.   Cardiovascular: Negative for chest pain and palpitations.  Gastrointestinal: Negative for nausea and vomiting.  Genitourinary: Negative for dysuria and urgency.  Musculoskeletal: Negative for myalgias and arthralgias.  Skin: Negative for pallor and wound.  Neurological: Negative for dizziness and headaches.      Allergies  Atorvastatin; Levofloxacin; Omnicef; Trazodone and nefazodone; and Sulfonamide derivatives  Home Medications   Prior to Admission medications   Medication Sig Start Date End Date Taking? Authorizing Provider  ALPRAZolam (XANAX) 0.5 MG tablet TAKE 1/2 TO 1 TABLET THREE TIMES DAILY AS NEEDED. Patient taking differently: TAKE 0.25mg -0.5mg  by mouth THREE TIMES DAILY AS NEEDED for anxiety  06/10/16  Yes Golden Circle, FNP  cyanocobalamin (,VITAMIN B-12,) 1000 MCG/ML injection Inject 1,000 mcg into the muscle every 30 (thirty) days.   Yes Historical Provider, MD  Doxylamine Succinate, Sleep, (UNISOM) 25 MG tablet Take 25 mg by mouth at bedtime.    Yes Historical Provider, MD  DULoxetine (CYMBALTA) 60 MG capsule TAKE 1 CAPSULE DAILY. Patient taking differently: TAKE 60 mg by mouth once DAILY. 04/21/16  Yes Golden Circle, FNP  hyoscyamine (LEVSIN SL) 0.125 MG SL tablet Place 1 tablet (0.125 mg total) under the tongue every 4 (four) hours as needed for cramping. 12/10/15  Yes Gatha Mayer, MD  morphine (MS CONTIN) 15 MG 12 hr tablet Take 15 mg by mouth every 12 (twelve) hours. Take with 60 mg to make 75 mg   Yes Historical Provider, MD  morphine (MS CONTIN) 60 MG 12 hr tablet Take 60 mg by mouth every 12 (twelve) hours. Take with 15 mg to make 75 mg   Yes Historical Provider, MD  ondansetron (ZOFRAN) 4 MG tablet 1 every 6 hours as needed for nausea Patient taking differently: Take 4 mg by mouth every 6 (six) hours as needed for nausea or vomiting. 1 every 6 hours as needed for nausea 11/04/15  Yes Gatha Mayer, MD  oxyCODONE-acetaminophen (PERCOCET) 7.5-325 MG tablet Take 1 tablet by mouth every 6 (six) hours as needed for moderate pain or severe pain.  02/12/14  Yes Historical Provider, MD  pantoprazole (PROTONIX) 40 MG tablet TAKE 1 TABLET 30 MINUTES BEFORE BREAKFAST Patient taking differently: TAKE 1 TABLET 30 MINUTES BEFORE BREAKFASt as needed for heartburn/acid reflux 09/17/14  Yes Noralee Space, MD  zoledronic acid (RECLAST) 5 MG/100ML SOLN Inject 5 mg into the vein as directed. Once a year...   Yes Historical Provider, MD  diphenoxylate-atropine (LOMOTIL) 2.5-0.025 MG tablet Take 1 tablet by mouth 4 (four) times daily as needed for diarrhea or loose stools (may take 2). Patient not taking: Reported on 04/24/2016 03/10/16   Gatha Mayer, MD  HYDROcodone-acetaminophen [facility])  5-325 MG tablet Take 1-2 tablets by mouth every 4 (four) hours as needed. Patient not taking: Reported on 04/24/2016 10/25/15   Roselee Culver, MD   BP 135/81 mmHg  Pulse 103  Temp(Src) 97.9 F (36.6 C) (Oral)  Resp 18  Ht 5\' 3"  (1.6 m)  Wt 117 lb (53.071 kg)  BMI 20.73 kg/m2  SpO2 96% Physical Exam  Constitutional: She is oriented to person, place, and time. She appears well-developed and well-nourished. No distress.  HENT:  Head: Normocephalic and atraumatic.  Eyes: EOM are normal. Pupils are equal, round, and reactive to light.  Neck: Normal range of  motion. Neck supple.  Cardiovascular: Normal rate and regular rhythm.  Exam reveals no gallop and no friction rub.   No murmur heard. Pulmonary/Chest: Effort normal. She has no wheezes. She has no rales.  Abdominal: Soft. She exhibits no distension. There is no tenderness. There is no rebound and no guarding.  Musculoskeletal: She exhibits tenderness (tenderness about the rightlower piriformis muscle.Pulse motor and sensation is intact distally.). She exhibits no edema.  Neurological: She is alert and oriented to person, place, and time.  Skin: Skin is warm and dry. She is not diaphoretic.  Psychiatric: She has a normal mood and affect. Her behavior is normal.  Nursing note and vitals reviewed.   ED Course  Procedures (including critical care time) Labs Review Labs Reviewed - No data to display  Imaging Review Ct Lumbar Spine Wo Contrast  07/03/2016  CLINICAL DATA:  Severe progressive right hip pain. Right flank pain. EXAM: CT LUMBAR SPINE WITHOUT CONTRAST TECHNIQUE: Multidetector CT imaging of the lumbar spine was performed without intravenous contrast administration. Multiplanar CT image reconstructions were also generated. COMPARISON:  Bones scan dated 05/01/2016, CT scan of the abdomen dated 02/24/2013 and CT scan of the lumbar spine dated 10/07/2009 FINDINGS: T12-L1 and L1-2:  Normal. L2-3: Small disc bulge into the left  neural foramen without focal neural impingement. L3-4: Broad-based disc bulge asymmetric into the left neural foramen slightly compressing the ventral aspect of the thecal sac, progressed since the prior study. L4-5: There is a partially healed fracture through the base of the right pedicle of L4. This is best seen on images 59 and 60 of series 4. This correlates with the area of increased activity on the bone scan of 05/01/2016. Pedicle screws are in good position at L4-5 and hardware is intact. Interbody fusion device is integrated with solid interbody fusion. No visible residual impingement. L5-S1: Chronic lucency around the pedicle screws with no solid interbody or posterior fusion. The hardware is intact and in good position. Slight scarring around the right side of the thecal sac and the right L5 and S1 nerve root sleeves. This is unchanged since study of 2010. Incidental note is made of aortic atherosclerosis. Minimal degenerative changes of the sacroiliac joints. IMPRESSION: 1. Partially healed fracture of the right pedicle of L3. 2. Chronic nonunion at L5-S1. 3. Progressive disc degeneration at L3-4 now with a broad-based disc bulge asymmetric to the left slightly compressing the thecal sac. Electronically Signed   By: Lorriane Shire M.D.   On: 07/03/2016 10:43   Ct Renal Stone Study  07/03/2016  CLINICAL DATA:  Right hip pain, right flank pain EXAM: CT ABDOMEN AND PELVIS WITHOUT CONTRAST TECHNIQUE: Multidetector CT imaging of the abdomen and pelvis was performed following the standard protocol without IV contrast. COMPARISON:  02/24/2013 FINDINGS: Lower chest:  The lung bases are unremarkable.  Small hiatal hernia. Hepatobiliary: Unenhanced liver is unremarkable. No calcified gallstones are noted within gallbladder. Mild distended gallbladder. CBD measures 7 mm in diameter. Pancreas: Unenhanced pancreas is unremarkable. Spleen: Unenhanced spleen is unremarkable. Adrenals/Urinary Tract: No adrenal gland  mass. Unenhanced kidneys are symmetrical in size. No nephrolithiasis. No hydronephrosis or hydroureter. Bilateral mild dilatation of extrarenal pelvis. No calcified ureteral calculi are noted. No calcified calculi are noted within urinary bladder. Stomach/Bowel: No small bowel obstruction. No thickened or dilated small bowel loops. No pericecal inflammation. The patient is status post appendectomy. Stable fatty prominence of ileocecal valve. Some liquid stool noted within right colon and transverse colon. Diarrhea cannot be excluded.  Clinical correlation is necessary. No distal colonic obstruction. No colitis or diverticulitis. Vascular/Lymphatic: Atherosclerotic calcifications of abdominal aorta and iliac arteries. No aortic aneurysm. No retroperitoneal or mesenteric adenopathy. Reproductive: The unenhanced uterus is normal size for age anteflexed. No adnexal masses noted. No pelvic free fluid. Other: There is no evidence of ascites or free abdominal air. Musculoskeletal: No destructive bony lesions are noted. There is mild dextroscoliosis upper lumbar spine. Posterior fusion noted lumbar spine L4-L5 and S1 level. The alignment is preserved. Mild disc space flattening with mild anterior and mild posterior spurring at L3-L4 level. Mild posterior disc bulge at L2-L3 level. IMPRESSION: 1. There is no evidence of nephrolithiasis. No hydronephrosis or hydroureter. 2. No calcified calculi are noted within urinary bladder. No calcified ureteral calculi. 3. Some liquid stool noted in right colon and transverse colon without colonic distention. Nonspecific diarrhea cannot be excluded. 4. No pericecal inflammation. Unremarkable terminal ileum. The patient is status post appendectomy. 5. No adnexal mass.  Unremarkable uterus. 6. Mild degenerative changes lumbar spine. Posterior metallic fusion [id number] and S1 level with alignment preserved Electronically Signed   By: Lahoma Crocker M.D.   On: 07/03/2016 10:35   I have personally  reviewed and evaluated these images and lab results as part of my medical decision-making.   EKG Interpretation None      MDM   Final diagnoses:  Sciatica associated with disorder of lumbar spine, right    65 yo F with chronic back pain, worsening over past couple days.  Thinks different than normal, will obtain Ct scan to evaluate.  No cauda equina s/sx.   CT with no acute findings.  Will follow up with her spinal surgeon, pain mgmt.   12:30 PM:  I have discussed the diagnosis/risks/treatment options with the patient and family and believe the pt to be eligible for discharge home to follow-up with PCP. We also discussed returning to the ED immediately if new or worsening sx occur. We discussed the sx which are most concerning (e.g., sudden worsening pain, fever, inability to tolerate by mouth) that necessitate immediate return. Medications administered to the patient during their visit and any new prescriptions provided to the patient are listed below.  Medications given during this visit Medications  acetaminophen (TYLENOL) tablet 1,000 mg (1,000 mg Oral Refused 07/03/16 0945)  HYDROmorphone (DILAUDID) injection 1 mg (1 mg Intramuscular Given 07/03/16 0942)  diazepam (VALIUM) tablet 5 mg (5 mg Oral Given 07/03/16 0940)  ibuprofen (ADVIL,MOTRIN) tablet 800 mg (800 mg Oral Given 07/03/16 0940)  HYDROmorphone (DILAUDID) injection 1 mg (1 mg Intravenous Given 07/03/16 1104)    Discharge Medication List as of 07/03/2016 10:53 AM      The patient appears reasonably screen and/or stabilized for discharge and I doubt any other medical condition or other [facility] requiring further screening, evaluation, or treatment in the ED at this time prior to discharge.    Deno Etienne, DO 07/03/16 1230

## 2016-07-14 ENCOUNTER — Other Ambulatory Visit: Payer: Self-pay | Admitting: Orthopaedic Surgery

## 2016-07-14 ENCOUNTER — Ambulatory Visit (INDEPENDENT_AMBULATORY_CARE_PROVIDER_SITE_OTHER): Payer: BLUE CROSS/BLUE SHIELD

## 2016-07-14 ENCOUNTER — Other Ambulatory Visit: Payer: Self-pay | Admitting: Family

## 2016-07-14 DIAGNOSIS — E538 Deficiency of other specified B group vitamins: Secondary | ICD-10-CM

## 2016-07-14 DIAGNOSIS — G894 Chronic pain syndrome: Secondary | ICD-10-CM

## 2016-07-14 MED ORDER — CYANOCOBALAMIN 1000 MCG/ML IJ SOLN
1000.0000 ug | Freq: Once | INTRAMUSCULAR | Status: AC
  Administered 2016-07-14: 1000 ug via INTRAMUSCULAR

## 2016-07-15 NOTE — Telephone Encounter (Signed)
Script fax back to [facility]...Johny Chess

## 2016-07-17 ENCOUNTER — Ambulatory Visit: Payer: BLUE CROSS/BLUE SHIELD

## 2016-07-22 ENCOUNTER — Ambulatory Visit
Admission: RE | Admit: 2016-07-22 | Discharge: 2016-07-22 | Disposition: A | Discharge status: Home / Self Care | Payer: BLUE CROSS/BLUE SHIELD | Source: Ambulatory Visit | Attending: Orthopaedic Surgery | Admitting: Orthopaedic Surgery

## 2016-07-22 DIAGNOSIS — G894 Chronic pain syndrome: Secondary | ICD-10-CM

## 2016-08-11 ENCOUNTER — Ambulatory Visit (INDEPENDENT_AMBULATORY_CARE_PROVIDER_SITE_OTHER): Payer: BLUE CROSS/BLUE SHIELD | Admitting: General Practice

## 2016-08-11 DIAGNOSIS — E539 Vitamin B deficiency, unspecified: Secondary | ICD-10-CM

## 2016-08-11 MED ORDER — CYANOCOBALAMIN 1000 MCG/ML IJ SOLN
1000.0000 ug | Freq: Once | INTRAMUSCULAR | Status: AC
  Administered 2016-08-11: 1000 ug via INTRAMUSCULAR

## 2016-08-19 ENCOUNTER — Encounter: Payer: Self-pay | Admitting: Family

## 2016-08-25 ENCOUNTER — Other Ambulatory Visit: Payer: Self-pay | Admitting: Family

## 2016-09-01 ENCOUNTER — Other Ambulatory Visit: Payer: Self-pay | Admitting: Family

## 2016-09-01 NOTE — Telephone Encounter (Signed)
Pts last OV 04/22/16, last refill 07/14/16. Please advise.

## 2016-09-02 ENCOUNTER — Encounter: Payer: Self-pay | Admitting: Internal Medicine

## 2016-09-02 ENCOUNTER — Other Ambulatory Visit: Payer: Self-pay | Admitting: Family

## 2016-09-02 MED ORDER — PROMETHAZINE HCL 25 MG RE SUPP: RECTAL | 0 refills | Status: DC

## 2016-09-02 NOTE — Telephone Encounter (Signed)
OK to send in promethazine 25 mg suppositories # 12 1 every 8-12 hrs  If this fails to work would need urgent care or ED   If persistent sxs until tomorrow would see pcp

## 2016-09-08 ENCOUNTER — Ambulatory Visit (INDEPENDENT_AMBULATORY_CARE_PROVIDER_SITE_OTHER): Payer: BLUE CROSS/BLUE SHIELD | Admitting: General Practice

## 2016-09-08 DIAGNOSIS — E538 Deficiency of other specified B group vitamins: Secondary | ICD-10-CM | POA: Diagnosis not present

## 2016-09-08 MED ORDER — CYANOCOBALAMIN 1000 MCG/ML IJ SOLN
1000.0000 ug | Freq: Once | INTRAMUSCULAR | Status: AC
  Administered 2016-09-08: 1000 ug via INTRAMUSCULAR

## 2016-10-09 ENCOUNTER — Other Ambulatory Visit: Payer: Self-pay | Admitting: Family

## 2016-10-12 NOTE — Telephone Encounter (Signed)
Faxed script back to [facility].../lmb 

## 2016-10-20 ENCOUNTER — Ambulatory Visit (INDEPENDENT_AMBULATORY_CARE_PROVIDER_SITE_OTHER): Payer: BLUE CROSS/BLUE SHIELD | Admitting: General Practice

## 2016-10-20 ENCOUNTER — Ambulatory Visit: Payer: Self-pay | Admitting: Family

## 2016-10-20 DIAGNOSIS — Z23 Encounter for immunization: Secondary | ICD-10-CM | POA: Diagnosis not present

## 2016-10-20 DIAGNOSIS — E538 Deficiency of other specified B group vitamins: Secondary | ICD-10-CM | POA: Diagnosis not present

## 2016-10-20 MED ORDER — CYANOCOBALAMIN 1000 MCG/ML IJ SOLN
1000.0000 ug | Freq: Once | INTRAMUSCULAR | Status: AC
  Administered 2016-10-20: 1000 ug via INTRAMUSCULAR

## 2016-11-02 ENCOUNTER — Other Ambulatory Visit (HOSPITAL_COMMUNITY): Payer: Self-pay | Admitting: *Deleted

## 2016-11-03 ENCOUNTER — Ambulatory Visit (HOSPITAL_COMMUNITY)
Admission: RE | Admit: 2016-11-03 | Discharge: 2016-11-03 | Disposition: A | Discharge status: Home / Self Care | Payer: BLUE CROSS/BLUE SHIELD | Source: Ambulatory Visit | Attending: Obstetrics and Gynecology | Admitting: Obstetrics and Gynecology

## 2016-11-03 DIAGNOSIS — M818 Other osteoporosis without current pathological fracture: Secondary | ICD-10-CM | POA: Insufficient documentation

## 2016-11-03 MED ORDER — ZOLEDRONIC ACID 5 MG/100ML IV SOLN
INTRAVENOUS | Status: AC
  Filled 2016-11-03: qty 100

## 2016-11-03 MED ORDER — ZOLEDRONIC ACID 5 MG/100ML IV SOLN
5.0000 mg | Freq: Once | INTRAVENOUS | Status: AC
Start: 2016-11-03 — End: 2016-11-03
  Administered 2016-11-03: 5 mg via INTRAVENOUS

## 2016-11-11 ENCOUNTER — Other Ambulatory Visit: Payer: Self-pay

## 2016-11-11 ENCOUNTER — Other Ambulatory Visit: Payer: Self-pay | Admitting: Family

## 2016-11-12 NOTE — Telephone Encounter (Signed)
Last refill was 10/12/16.

## 2016-11-13 ENCOUNTER — Ambulatory Visit (INDEPENDENT_AMBULATORY_CARE_PROVIDER_SITE_OTHER): Payer: BLUE CROSS/BLUE SHIELD

## 2016-11-13 DIAGNOSIS — E538 Deficiency of other specified B group vitamins: Secondary | ICD-10-CM | POA: Diagnosis not present

## 2016-11-13 DIAGNOSIS — Z23 Encounter for immunization: Secondary | ICD-10-CM | POA: Diagnosis not present

## 2016-11-13 MED ORDER — ALPRAZOLAM 0.5 MG PO TABS
ORAL_TABLET | ORAL | 0 refills | Status: DC
Start: 2016-11-13 — End: 2016-12-09

## 2016-11-13 MED ORDER — CYANOCOBALAMIN 1000 MCG/ML IJ SOLN
1000.0000 ug | Freq: Once | INTRAMUSCULAR | Status: AC
  Administered 2016-11-13: 1000 ug via INTRAMUSCULAR

## 2016-11-13 NOTE — Telephone Encounter (Signed)
Faxed

## 2016-12-09 ENCOUNTER — Other Ambulatory Visit: Payer: Self-pay | Admitting: Family

## 2016-12-10 NOTE — Telephone Encounter (Signed)
Last refill was 11/13/16

## 2016-12-11 NOTE — Telephone Encounter (Signed)
Rx faxed

## 2017-01-02 ENCOUNTER — Other Ambulatory Visit: Payer: Self-pay | Admitting: Family

## 2017-01-10 ENCOUNTER — Other Ambulatory Visit: Payer: Self-pay | Admitting: Family

## 2017-01-10 ENCOUNTER — Other Ambulatory Visit: Payer: Self-pay | Admitting: Pulmonary Disease

## 2017-01-11 ENCOUNTER — Other Ambulatory Visit: Payer: Self-pay | Admitting: Family

## 2017-01-11 NOTE — Telephone Encounter (Signed)
Last refill was 12/10/16 

## 2017-01-12 ENCOUNTER — Telehealth: Payer: Self-pay | Admitting: Family

## 2017-01-12 ENCOUNTER — Other Ambulatory Visit: Payer: Self-pay | Admitting: Family

## 2017-01-12 NOTE — Telephone Encounter (Signed)
Routing to greg calone, please advise, thanks

## 2017-01-12 NOTE — Telephone Encounter (Signed)
Patient went to pharmacy to pick up xanax.  Pharmacy does not have script.  Please follow up in regard.

## 2017-01-12 NOTE — Telephone Encounter (Signed)
Faxed

## 2017-01-12 NOTE — Telephone Encounter (Signed)
Rx resent.

## 2017-01-28 DIAGNOSIS — Z79891 Long term (current) use of opiate analgesic: Secondary | ICD-10-CM | POA: Diagnosis not present

## 2017-01-28 DIAGNOSIS — G894 Chronic pain syndrome: Secondary | ICD-10-CM | POA: Diagnosis not present

## 2017-01-28 DIAGNOSIS — M961 Postlaminectomy syndrome, not elsewhere classified: Secondary | ICD-10-CM | POA: Diagnosis not present

## 2017-01-28 DIAGNOSIS — M546 Pain in thoracic spine: Secondary | ICD-10-CM | POA: Diagnosis not present

## 2017-02-02 ENCOUNTER — Other Ambulatory Visit: Payer: Self-pay | Admitting: Family

## 2017-02-05 DIAGNOSIS — M4326 Fusion of spine, lumbar region: Secondary | ICD-10-CM | POA: Diagnosis not present

## 2017-02-05 DIAGNOSIS — M5106 Intervertebral disc disorders with myelopathy, lumbar region: Secondary | ICD-10-CM | POA: Diagnosis not present

## 2017-02-05 DIAGNOSIS — M791 Myalgia: Secondary | ICD-10-CM | POA: Diagnosis not present

## 2017-02-05 DIAGNOSIS — G894 Chronic pain syndrome: Secondary | ICD-10-CM | POA: Diagnosis not present

## 2017-02-15 ENCOUNTER — Other Ambulatory Visit: Payer: Self-pay | Admitting: Family

## 2017-02-15 NOTE — Telephone Encounter (Signed)
Patient advised of gregs note, she will call back to schedule appt

## 2017-02-15 NOTE — Telephone Encounter (Signed)
Routing to greg, please advise, thanks 

## 2017-02-15 NOTE — Telephone Encounter (Signed)
Medication refilled and will be due for office visit at next refill.

## 2017-02-18 DIAGNOSIS — M961 Postlaminectomy syndrome, not elsewhere classified: Secondary | ICD-10-CM | POA: Diagnosis not present

## 2017-02-18 DIAGNOSIS — M546 Pain in thoracic spine: Secondary | ICD-10-CM | POA: Diagnosis not present

## 2017-02-18 DIAGNOSIS — Z79891 Long term (current) use of opiate analgesic: Secondary | ICD-10-CM | POA: Diagnosis not present

## 2017-02-18 DIAGNOSIS — G894 Chronic pain syndrome: Secondary | ICD-10-CM | POA: Diagnosis not present

## 2017-03-02 ENCOUNTER — Emergency Department (HOSPITAL_COMMUNITY)
Admission: EM | Admit: 2017-03-02 | Discharge: 2017-03-02 | Disposition: A | Discharge status: Home / Self Care | Payer: Medicare HMO | Attending: Emergency Medicine | Admitting: Emergency Medicine

## 2017-03-02 ENCOUNTER — Encounter (HOSPITAL_COMMUNITY): Payer: Self-pay | Admitting: Emergency Medicine

## 2017-03-02 ENCOUNTER — Emergency Department (HOSPITAL_COMMUNITY): Payer: Medicare HMO

## 2017-03-02 DIAGNOSIS — F17210 Nicotine dependence, cigarettes, uncomplicated: Secondary | ICD-10-CM | POA: Insufficient documentation

## 2017-03-02 DIAGNOSIS — I10 Essential (primary) hypertension: Secondary | ICD-10-CM | POA: Insufficient documentation

## 2017-03-02 DIAGNOSIS — M545 Low back pain: Secondary | ICD-10-CM | POA: Diagnosis not present

## 2017-03-02 DIAGNOSIS — R69 Illness, unspecified: Secondary | ICD-10-CM | POA: Diagnosis not present

## 2017-03-02 DIAGNOSIS — G8929 Other chronic pain: Secondary | ICD-10-CM | POA: Diagnosis not present

## 2017-03-02 MED ORDER — HYDROMORPHONE HCL 1 MG/ML IJ SOLN
1.0000 mg | Freq: Once | INTRAMUSCULAR | Status: AC
  Administered 2017-03-02: 1 mg via INTRAMUSCULAR
  Filled 2017-03-02: qty 1

## 2017-03-02 NOTE — ED Provider Notes (Signed)
[facility] DEPT Provider Note    By signing my name below, I, Bea Graff, attest that this documentation has been prepared under the direction and in the presence of Harlene Ramus, PA-C. Electronically Signed: Bea Graff, ED Scribe. 03/02/17. 4:45 PM.    History   Chief Complaint Chief Complaint  Patient presents with  . Back Pain    chronic    The history is provided by the patient and medical records. No language interpreter was used.    Denise King is a 66 y.o. female with PMHx of chronic back pain, arthritis, anemia, HTN, osteoporosis and chronic pain syndrome who presents to the Emergency Department complaining of worsening constant lower back pain that began about 4-5 days ago. Patient reports pain is consistent with her typical chronic low back pain but notes it has gotten worse in severity over the past 5 days. She reports associated pain radiating into her right hip. Pt is currently in pain management and received 90 Morphine tablets four days ago from Dr. Hardin Negus. She states she called her pain management clinic and cannot be seen for another two days. She has been taking her normally prescribed Morphine, Oxycodone and Flexeril for pain with no significant relief. Movements increase her pain. She denies alleviating factors. She denies fever, chills, nausea, vomiting, abdominal pain, numbness, tingling or weakness of the lower extremities, bowel or bladder incontinence, saddle anesthesia. She reports having a lumbar fusion nine years ago with Dr. Patrice Paradise. She denies any trauma, injury or fall.   Past Medical History:  Diagnosis Date  . Acute cystitis   . Allergy    as a child grew out of them  . Anemia    pernicious anemia  . Anxiety   . Arthritis   . Back pain   . Blood transfusion   . Chronic pain syndrome   . Depression   . Diverticulosis of colon   . Dysthymia   . Gastritis   . GERD (gastroesophageal reflux disease)   . H/O chest pain Dec.  2013   no work up done  . Hx of colonic polyps   . Hypertension   . Irritable bowel syndrome   . Osteopenia   . Osteoporosis   . Reflux esophagitis   . Tobacco use disorder   . Unspecified chronic bronchitis   . Vertigo   . Vitamin B12 deficiency   . Wears glasses     Patient Active Problem List   Diagnosis Date Noted  . Diarrhea 04/22/2016  . Family history of colon cancer - brother and father 03/10/2016  . Insomnia 02/11/2016  . Generalized anxiety disorder 11/25/2015  . Urinary frequency 04/25/2015  . Nausea and vomiting in adult 11/08/2014  . Cigarette smoker 08/15/2013  . Otitis media 10/27/2012  . Abdominal pain, generalized 03/30/2012  . HEPATIC CYST 02/18/2010  . CHRONIC PAIN SYNDROME 02/08/2010  . Vitamin B12 deficiency 10/04/2009  . GERD 01/04/2009  . ABDOMINAL PAIN-EPIGASTRIC 01/04/2009  . DYSTHYMIA 08/10/2008  . VENOUS INSUFFICIENCY 08/10/2008  . Irritable bowel syndrome 08/10/2008  . Back pain 08/10/2008  . CIGARETTE SMOKER 03/06/2008  . Hx of adenomatous polyp of colon 12/06/2007  . BRONCHITIS, RECURRENT 12/06/2007  . REFLUX ESOPHAGITIS 12/06/2007  . DIVERTICULOSIS OF COLON 12/06/2007  . OSTEOPENIA 12/06/2007  . CALCULUS, KIDNEY 10/07/2007  . VERTIGO 10/07/2007  . HEADACHE 10/07/2007    Past Surgical History:  Procedure Laterality Date  . APPENDECTOMY    . BACK SURGERY  10-09   Dr. Patrice Paradise  .  CARPAL TUNNEL RELEASE Right 08/14/2014   Procedure: RIGHT CARPAL TUNNEL RELEASE AND INJECT LEFT THUMB;  Surgeon: Daryll Brod, MD;  Location: [facility];  Service: Orthopedics;  Laterality: Right;  . COLONOSCOPY    . LAPAROSCOPY N/A 02/21/2013   Procedure: LAPAROSCOPY OPERATIVE;  Surgeon: Margarette Asal, MD;  Location: [facility];  Service: Gynecology;  Laterality: N/A;  REQUESTING 5MM SCOPE WITH CAMERA  . TONSILLECTOMY    . TUBAL LIGATION    . UPPER GASTROINTESTINAL ENDOSCOPY      OB History    No data available       Home Medications     Prior to Admission medications   Medication Sig Start Date End Date Taking? Authorizing Provider  ALPRAZolam Duanne Moron) 0.5 MG tablet TAKE 1/2 TO 1 TABLET THREE TIMES DAILY AS NEEDED. 02/15/17   Golden Circle, FNP  cyanocobalamin (,VITAMIN B-12,) 1000 MCG/ML injection Inject 1,000 mcg into the muscle every 30 (thirty) days.    Historical Provider, MD  diphenoxylate-atropine (LOMOTIL) 2.5-0.025 MG tablet Take 1 tablet by mouth 4 (four) times daily as needed for diarrhea or loose stools (may take 2). Patient not taking: Reported on 04/24/2016 03/10/16   Gatha Mayer, MD  Doxylamine Succinate, Sleep, (UNISOM) 25 MG tablet Take 25 mg by mouth at bedtime.     Historical Provider, MD  DULoxetine (CYMBALTA) 60 MG capsule TAKE 1 CAPSULE DAILY. 02/03/17   Golden Circle, FNP  HYDROcodone-acetaminophen (NORCO) 5-325 MG tablet Take 1-2 tablets by mouth every 4 (four) hours as needed. Patient not taking: Reported on 04/24/2016 10/25/15   Roselee Culver, MD  hyoscyamine (LEVSIN SL) 0.125 MG SL tablet Place 1 tablet (0.125 mg total) under the tongue every 4 (four) hours as needed for cramping. 12/10/15   Gatha Mayer, MD  morphine (MS CONTIN) 15 MG 12 hr tablet Take 15 mg by mouth every 12 (twelve) hours. Take with 60 mg to make 75 mg    Historical Provider, MD  morphine (MS CONTIN) 60 MG 12 hr tablet Take 60 mg by mouth every 12 (twelve) hours. Take with 15 mg to make 75 mg    Historical Provider, MD  ondansetron (ZOFRAN) 4 MG tablet 1 every 6 hours as needed for nausea Patient taking differently: Take 4 mg by mouth every 6 (six) hours as needed for nausea or vomiting. 1 every 6 hours as needed for nausea 11/04/15   Gatha Mayer, MD  oxyCODONE-acetaminophen (PERCOCET) 7.5-325 MG tablet Take 1 tablet by mouth every 6 (six) hours as needed for moderate pain or severe pain.  02/12/14   Historical Provider, MD  pantoprazole (PROTONIX) 40 MG tablet TAKE 1 TABLET 30 MINUTES BEFORE BREAKFAST Patient taking  differently: TAKE 1 TABLET 30 MINUTES BEFORE BREAKFASt as needed for heartburn/acid reflux 09/17/14   Noralee Space, MD  promethazine (PHENERGAN) 25 MG suppository Insert unwrapped suppository every 8-12 hours 09/02/16   Gatha Mayer, MD  zoledronic acid (RECLAST) 5 MG/100ML SOLN Inject 5 mg into the vein as directed. Once a year...    Historical Provider, MD    Family History Family History  Problem Relation Age of Onset  . Colon cancer Father   . Prostate cancer Father   . Heart disease Mother     prev MVR, also has DJD  . Colon cancer Brother   . Skin cancer Sister   . Esophageal cancer Neg Hx   . Rectal cancer Neg Hx   . Stomach cancer  Neg Hx     Social History Social History  Substance Use Topics  . Smoking status: Current Every Day Smoker    Packs/day: 1.00    Years: 30.00    Types: Cigarettes  . Smokeless tobacco: Never Used  . Alcohol use 0.0 oz/week     Comment: occasional     Allergies   Atorvastatin; Levofloxacin; Omnicef [cefdinir]; Trazodone and nefazodone; and Sulfonamide derivatives   Review of Systems Review of Systems  Constitutional: Negative for chills and fever.  Gastrointestinal: Negative for abdominal pain, nausea and vomiting.       No bowel or bladder incontinence  Musculoskeletal: Positive for back pain.  Skin: Negative for color change and wound.  Neurological: Negative for weakness and numbness.     Physical Exam Updated Vital Signs BP 150/90   Pulse 105   Temp 97.6 F (36.4 C) (Oral)   Resp 18   SpO2 99%   Physical Exam  Constitutional: She is oriented to person, place, and time. She appears well-developed and well-nourished.  HENT:  Head: Normocephalic and atraumatic.  Eyes: Conjunctivae and EOM are normal. Right eye exhibits no discharge. Left eye exhibits no discharge. No scleral icterus.  Neck: Normal range of motion. Neck supple.  Cardiovascular: Normal rate, regular rhythm, normal heart sounds and intact distal pulses.   Exam reveals no gallop and no friction rub.   No murmur heard. Pulmonary/Chest: Effort normal and breath sounds normal. No respiratory distress. She has no wheezes. She has no rales.  Abdominal: Soft. Bowel sounds are normal. There is no tenderness.  Musculoskeletal: She exhibits tenderness. She exhibits no edema or deformity.  No midline C or T tenderness. Lumbar midline spine tender to palpation. Tenderness over right lumbar paraspinal muscles. Full range of motion of neck and decreased ROM of back due to pain. Full range of motion of bilateral upper and lower extremities, with 5/5 strength. Sensation intact. 2+ radial and PT pulses. Cap refill <2 seconds. Patient able to stand and ambulate without assistance with reported pain.  Neurological: She is alert and oriented to person, place, and time. She displays normal reflexes. No sensory deficit.  Skin: Skin is warm and dry.  Nursing note and vitals reviewed.    ED Treatments / Results  DIAGNOSTIC STUDIES: Oxygen Saturation is 99% on RA, normal by my interpretation.   COORDINATION OF CARE: 3:47 PM- Will X-Ray lumbar spine. Will order injection of Dilaudid. Pt verbalizes understanding and agrees to plan.  Medications  HYDROmorphone (DILAUDID) injection 1 mg (not administered)  HYDROmorphone (DILAUDID) injection 1 mg (1 mg Intramuscular Given 03/02/17 1606)    Labs (all labs ordered are listed, but only abnormal results are displayed) Labs Reviewed - No data to display  EKG  EKG Interpretation None       Radiology Dg Lumbar Spine Complete  Result Date: 03/02/2017 CLINICAL DATA:  Chronic low back pain, worsening for 4 days. Pain radiates to RIGHT hip. EXAM: LUMBAR SPINE - COMPLETE 4+ VIEW COMPARISON:  CT lumbar spine July 22, 2016 FINDINGS: Status post L4 through S1 PLIF, similar lucency about the S1 pedicle screws. L4-5 and L5-S1 laminectomies. Hardware is intact. Arthrodesis demonstrate on prior CT. New L3 inferior endplate  sclerosis with disc height loss. No fracture deformity. Mild thoracolumbar dextroscoliosis. Minimal L3-4 retrolisthesis. Osteopenia. Mild calcific atherosclerosis. IMPRESSION: No acute fracture deformity. Stable minimal grade 1 L3-4 retrolisthesis. Progressed L3-4 degenerative discs suggesting adjacent segment disease, increased sclerosis L3 inferior endplate is likely degenerative, less likely representing nondisplaced  fracture. Status post L4 through S1 PLIF with stable S1 pedicle screw loosening. Electronically Signed   By: Elon Alas M.D.   On: 03/02/2017 16:30    Procedures Procedures (including critical care time)  Medications Ordered in ED Medications  HYDROmorphone (DILAUDID) injection 1 mg (not administered)  HYDROmorphone (DILAUDID) injection 1 mg (1 mg Intramuscular Given 03/02/17 1606)     Initial Impression / Assessment and Plan / ED Course  I have reviewed the triage vital signs and the nursing notes.  Pertinent labs & imaging results that were available during my care of the patient were reviewed by me and considered in my medical decision making (see chart for details).     Patient is a 66 y.o. female with PMHx of chronic pain syndrome who presents to the ED with acute on chronic low back pain. No neurological deficits appreciated. Patient is ambulatory. No warning symptoms of back pain including: fecal incontinence, urinary retention or overflow incontinence, night sweats, waking from sleep with back pain, unexplained fevers or weight loss, h/o cancer, IVDU, recent trauma. No concern for cauda equina, epidural abscess, or other serious cause of back pain warranting further workup or evaluation. Lumbar xray showed no acute fx, stable minimal grade L3-4 retrolisthesis, progresses L3-4 degenerative discs. On reevaluation patient reports mild improvement of pain while in the ED. Patient able to stand and ambulate but endorses pain. Suspect patient's symptom is likely related to  her chronic low back pain. Conservative measures such as rest, ice/heat and pain medicine indicated with pain management follow-up. Advised patient to follow up with her pain clinic at her scheduled appointment in 2 days. Discussed strict return precautions.  I personally performed the services described in this documentation, which was scribed in my presence. The recorded information has been reviewed and is accurate.   Final Clinical Impressions(s) / ED Diagnoses   Final diagnoses:  Chronic low back pain without sciatica, unspecified back pain laterality    New Prescriptions New Prescriptions   No medications on file     Nona Dell, PA-C 03/02/17 [place], MD 03/03/17 [phone]

## 2017-03-02 NOTE — Discharge Instructions (Signed)
Continue taking your home medications as prescribed. I recommend refraining from doing any heavy lifting, squatting or repetitive movement that exacerbates her symptoms for the next 2 days. Follow-up with your pain clinic physician at your scheduled appointment in 2 days. Please return to the Emergency Department if symptoms worsen or new onset of fever, numbness, tingling, groin anesthesia, loss of bowel or bladder, weakness.

## 2017-03-02 NOTE — ED Triage Notes (Signed)
Pt report back pain , chronic pain yet today's pain is worse and no relief despite pain meds. denies new injury.

## 2017-03-04 ENCOUNTER — Ambulatory Visit: Payer: BLUE CROSS/BLUE SHIELD | Admitting: Family

## 2017-03-04 DIAGNOSIS — M961 Postlaminectomy syndrome, not elsewhere classified: Secondary | ICD-10-CM | POA: Diagnosis not present

## 2017-03-04 DIAGNOSIS — Z79891 Long term (current) use of opiate analgesic: Secondary | ICD-10-CM | POA: Diagnosis not present

## 2017-03-04 DIAGNOSIS — S32009A Unspecified fracture of unspecified lumbar vertebra, initial encounter for closed fracture: Secondary | ICD-10-CM | POA: Diagnosis not present

## 2017-03-04 DIAGNOSIS — G894 Chronic pain syndrome: Secondary | ICD-10-CM | POA: Diagnosis not present

## 2017-03-09 ENCOUNTER — Ambulatory Visit (INDEPENDENT_AMBULATORY_CARE_PROVIDER_SITE_OTHER): Payer: Medicare HMO | Admitting: Family

## 2017-03-09 ENCOUNTER — Encounter: Payer: Self-pay | Admitting: Family

## 2017-03-09 ENCOUNTER — Other Ambulatory Visit (INDEPENDENT_AMBULATORY_CARE_PROVIDER_SITE_OTHER): Payer: Medicare HMO

## 2017-03-09 VITALS — BP 122/82 | HR 100 | Temp 98.1°F | Resp 16 | Ht 63.0 in | Wt 118.0 lb

## 2017-03-09 DIAGNOSIS — F341 Dysthymic disorder: Secondary | ICD-10-CM

## 2017-03-09 DIAGNOSIS — K219 Gastro-esophageal reflux disease without esophagitis: Secondary | ICD-10-CM

## 2017-03-09 DIAGNOSIS — E538 Deficiency of other specified B group vitamins: Secondary | ICD-10-CM | POA: Diagnosis not present

## 2017-03-09 DIAGNOSIS — R69 Illness, unspecified: Secondary | ICD-10-CM | POA: Diagnosis not present

## 2017-03-09 DIAGNOSIS — K580 Irritable bowel syndrome with diarrhea: Secondary | ICD-10-CM

## 2017-03-09 LAB — COMPREHENSIVE METABOLIC PANEL
ALT: 10 U/L (ref 0–35)
AST: 23 U/L (ref 0–37)
Albumin: 3.8 g/dL (ref 3.5–5.2)
Alkaline Phosphatase: 98 U/L (ref 39–117)
BUN: 7 mg/dL (ref 6–23)
CO2: 30 mEq/L (ref 19–32)
Calcium: 9 mg/dL (ref 8.4–10.5)
Chloride: 98 mEq/L (ref 96–112)
Creatinine, Ser: 0.57 mg/dL (ref 0.40–1.20)
GFR: 113.57 mL/min (ref >60.00)
Glucose, Bld: 90 mg/dL (ref 70–99)
Potassium: 4.1 mEq/L (ref 3.5–5.1)
Sodium: 134 mEq/L — ABNORMAL LOW (ref 135–145)
Total Bilirubin: 0.5 mg/dL (ref 0.2–1.2)
Total Protein: 6.9 g/dL (ref 6.0–8.3)

## 2017-03-09 LAB — VITAMIN B12: Vitamin B-12: 592 pg/mL (ref 211–911)

## 2017-03-09 LAB — CBC
HCT: 52.8 % — ABNORMAL HIGH (ref 36.0–46.0)
Hemoglobin: 17.9 g/dL — ABNORMAL HIGH (ref 12.0–15.0)
MCHC: 33.9 g/dL (ref 30.0–36.0)
MCV: 102.9 fl — ABNORMAL HIGH (ref 78.0–100.0)
Platelets: 299 10*3/uL (ref 150.0–400.0)
RBC: 5.13 Mil/uL — ABNORMAL HIGH (ref 3.87–5.11)
RDW: 13.5 % (ref 11.5–15.5)
WBC: 7.7 10*3/uL (ref 4.0–10.5)

## 2017-03-09 MED ORDER — PANTOPRAZOLE SODIUM 40 MG PO TBEC: DELAYED_RELEASE_TABLET | ORAL | 1 refills | Status: DC

## 2017-03-09 MED ORDER — DULOXETINE HCL 60 MG PO CPEP: 60.0000 mg | ORAL_CAPSULE | Freq: Every day | ORAL | 2 refills | Status: DC

## 2017-03-09 MED ORDER — HYOSCYAMINE SULFATE 0.125 MG SL SUBL: 0.1250 mg | SUBLINGUAL_TABLET | SUBLINGUAL | 3 refills | Status: DC | PRN

## 2017-03-09 MED ORDER — ALPRAZOLAM 0.5 MG PO TABS: ORAL_TABLET | ORAL | 1 refills | Status: DC

## 2017-03-09 MED ORDER — ONDANSETRON HCL 4 MG PO TABS: ORAL_TABLET | ORAL | 0 refills | Status: DC

## 2017-03-09 MED ORDER — CYANOCOBALAMIN 1000 MCG/ML IJ SOLN
1000.0000 ug | Freq: Once | INTRAMUSCULAR | Status: AC
  Administered 2017-03-09: 1000 ug via INTRAMUSCULAR

## 2017-03-09 MED ORDER — ARIPIPRAZOLE 2 MG PO TABS: 2.0000 mg | ORAL_TABLET | Freq: Every day | ORAL | 1 refills | Status: DC

## 2017-03-09 NOTE — Assessment & Plan Note (Signed)
Obtain B12. Continue injections pending B12 results.

## 2017-03-09 NOTE — Patient Instructions (Signed)
Thank you for choosing [facility].  SUMMARY AND INSTRUCTIONS:  Please continue to take your medications as prescribed.   Start the Abilify daily. If thoughts of suicide develop please seek emergency care.  Medication:  Your prescription(s) have been submitted to your pharmacy or been printed and provided for you. Please take as directed and contact our office if you believe you are having problem(s) with the medication(s) or have any questions.  Labs:  Please stop by the lab on the lower level of the building for your blood work. Your results will be released to [facility] (or called to you) after review, usually within 72 hours after test completion. If any changes need to be made, you will be notified at that same time.  1.) The lab is open from 7:30am to 5:30 pm Monday-Friday 2.) No appointment is necessary 3.) Fasting (if needed) is 6-8 hours after food and drink; black coffee and water are okay   Follow up:  If your symptoms worsen or fail to improve, please contact our office for further instruction, or in case of emergency go directly to the emergency room at the closest medical facility.

## 2017-03-09 NOTE — Assessment & Plan Note (Signed)
Stable with current dosage of pantoprazole with no adverse side effects. Continue current dosage of pantoprazole. Recommend following current related diet. Continue to monitor.

## 2017-03-09 NOTE — Progress Notes (Signed)
Subjective:    Patient ID: Denise King, female    DOB: 1951-06-23, 66 y.o.   MRN: [id number]  Chief Complaint  Patient presents with  . Medication Refill    HPI:  Denise King is a 66 y.o. female who  has a past medical history of Acute cystitis; Allergy; Anemia; Anxiety; Arthritis; Back pain; Blood transfusion; Chronic pain syndrome; Depression; Diverticulosis of colon; Dysthymia; Gastritis; GERD (gastroesophageal reflux disease); H/O chest pain (Dec. 2013); colonic polyps; Hypertension; Irritable bowel syndrome; Osteopenia; Osteoporosis; Reflux esophagitis; Tobacco use disorder; Unspecified chronic bronchitis; Vertigo; Vitamin B12 deficiency; and Wears glasses. and presents today for a follow up office visit.   1. Depression -currently maintained on fluoxetine and alprazolam. Reports taking medications as prescribed and denies adverse side effects. Notes that her symptoms of depression have been worsening over the past 6-8 months. She does have several personal and family stressors that are contributing to her current situation. The alprazolam does help to maintain her anxiety. Denies suicidal ideations.   2.) [name] - Currently maintained on hyoscyamine and ondansetron. Reports taking the medication as prescribed and denies adverse side effects. Symptoms are generally well controlled with the current medication.   3.) GERD - Currently maintained on pantoprazole. Reports taking the medication as prescribed and denies adverse side effects.    4.) B12 Deficiency - Previously diagnosed with B12 deficiency and maintained on monthly B12 injections for supplementation without side effect. Has not had an injection in about 4 months. Does have a significant amount of fatigue with no numbness or tingling.     Allergies  Allergen Reactions  . Atorvastatin Nausea And Vomiting  . Levofloxacin     Reaction: achilles tendon pain  . Omnicef [Cefdinir] Diarrhea    Uncontrollable  diarrhea  . Trazodone And Nefazodone     "Felt like I was going to faint"  . Sulfonamide Derivatives Rash      Outpatient Medications Prior to Visit  Medication Sig Dispense Refill  . cyanocobalamin (,VITAMIN B-12,) 1000 MCG/ML injection Inject 1,000 mcg into the muscle every 30 (thirty) days.    . diphenoxylate-atropine (LOMOTIL) 2.5-0.025 MG tablet Take 1 tablet by mouth 4 (four) times daily as needed for diarrhea or loose stools (may take 2). 90 tablet 0  . Doxylamine Succinate, Sleep, (UNISOM) 25 MG tablet Take 25 mg by mouth at bedtime.     Marland Kitchen morphine (MS CONTIN) 15 MG 12 hr tablet Take 15 mg by mouth every 12 (twelve) hours. Take with 60 mg to make 75 mg    . morphine (MS CONTIN) 60 MG 12 hr tablet Take 60 mg by mouth every 12 (twelve) hours. Take with 15 mg to make 75 mg    . promethazine (PHENERGAN) 25 MG suppository Insert unwrapped suppository every 8-12 hours 12 each 0  . zoledronic acid (RECLAST) 5 MG/100ML SOLN Inject 5 mg into the vein as directed. Once a year...    . ALPRAZolam (XANAX) 0.5 MG tablet TAKE 1/2 TO 1 TABLET THREE TIMES DAILY AS NEEDED. 90 tablet 0  . DULoxetine (CYMBALTA) 60 MG capsule TAKE 1 CAPSULE DAILY. 30 capsule 0  . HYDROcodone-acetaminophen (NORCO) 5-325 MG tablet Take 1-2 tablets by mouth every 4 (four) hours as needed. 30 tablet 0  . hyoscyamine (LEVSIN SL) 0.125 MG SL tablet Place 1 tablet (0.125 mg total) under the tongue every 4 (four) hours as needed for cramping. 60 tablet 3  . ondansetron (ZOFRAN) 4 MG tablet 1 every 6 hours as  needed for nausea (Patient taking differently: Take 4 mg by mouth every 6 (six) hours as needed for nausea or vomiting. 1 every 6 hours as needed for nausea) 30 tablet 0  . oxyCODONE-acetaminophen (PERCOCET) 7.5-325 MG tablet Take 1 tablet by mouth every 6 (six) hours as needed for moderate pain or severe pain.     . pantoprazole (PROTONIX) 40 MG tablet TAKE 1 TABLET 30 MINUTES BEFORE BREAKFAST (Patient taking differently:  TAKE 1 TABLET 30 MINUTES BEFORE BREAKFASt as needed for heartburn/acid reflux) 30 tablet 6   No facility-administered medications prior to visit.       Past Surgical History:  Procedure Laterality Date  . APPENDECTOMY    . BACK SURGERY  10-09   Dr. Patrice Paradise  . CARPAL TUNNEL RELEASE Right 08/14/2014   Procedure: RIGHT CARPAL TUNNEL RELEASE AND INJECT LEFT THUMB;  Surgeon: Daryll Brod, MD;  Location: [facility];  Service: Orthopedics;  Laterality: Right;  . COLONOSCOPY    . LAPAROSCOPY N/A 02/21/2013   Procedure: LAPAROSCOPY OPERATIVE;  Surgeon: Margarette Asal, MD;  Location: [facility];  Service: Gynecology;  Laterality: N/A;  REQUESTING 5MM SCOPE WITH CAMERA  . TONSILLECTOMY    . TUBAL LIGATION    . UPPER GASTROINTESTINAL ENDOSCOPY        Past Medical History:  Diagnosis Date  . Acute cystitis   . Allergy    as a child grew out of them  . Anemia    pernicious anemia  . Anxiety   . Arthritis   . Back pain   . Blood transfusion   . Chronic pain syndrome   . Depression   . Diverticulosis of colon   . Dysthymia   . Gastritis   . GERD (gastroesophageal reflux disease)   . H/O chest pain Dec. 2013   no work up done  . Hx of colonic polyps   . Hypertension   . Irritable bowel syndrome   . Osteopenia   . Osteoporosis   . Reflux esophagitis   . Tobacco use disorder   . Unspecified chronic bronchitis   . Vertigo   . Vitamin B12 deficiency   . Wears glasses       Review of Systems  Constitutional: Negative for chills and fever.  Respiratory: Negative for chest tightness and shortness of breath.   Cardiovascular: Negative for chest pain, palpitations and leg swelling.  Gastrointestinal: Negative for abdominal pain, blood in stool, constipation, diarrhea, nausea and vomiting.  Psychiatric/Behavioral: Positive for dysphoric mood. Negative for agitation, decreased concentration, self-injury, sleep disturbance and suicidal ideas. The patient is nervous/anxious.        Objective:    BP 122/82 (BP Location: Left Arm, Patient Position: Sitting, Cuff Size: Normal)   Pulse 100   Temp 98.1 F (36.7 C) (Oral)   Resp 16   Ht '5\' 3"'  (1.6 m)   Wt 118 lb (53.5 kg)   SpO2 92%   BMI 20.90 kg/m  Nursing note and vital signs reviewed.  Physical Exam  Constitutional: She is oriented to person, place, and time. She appears well-developed and well-nourished. No distress.  Cardiovascular: Normal rate, regular rhythm, normal heart sounds and intact distal pulses.   Pulmonary/Chest: Effort normal and breath sounds normal.  Abdominal: Normal appearance. She exhibits no mass. There is no tenderness. There is no rigidity, no rebound, no guarding, no tenderness at McBurney's point and negative Murphy's sign.  Neurological: She is alert and oriented to person, place, and time.  Skin: Skin  is warm and dry.  Psychiatric: Her behavior is normal. Judgment and thought content normal. She exhibits a depressed mood.       Assessment & Plan:   Problem List Items Addressed This Visit      Digestive   GERD    Stable with current dosage of pantoprazole with no adverse side effects. Continue current dosage of pantoprazole. Recommend following current related diet. Continue to monitor.      Relevant Medications   pantoprazole (PROTONIX) 40 MG tablet   hyoscyamine (LEVSIN SL) 0.125 MG SL tablet   ondansetron (ZOFRAN) 4 MG tablet   Other Relevant Orders   Comp Met (CMET) (Completed)   CBC (Completed)   Irritable bowel syndrome    Stable and currently managed by gastroenterology. No current symptoms upon exam. Continue current dosage of hyoscyamine and ondansetron. Continue to monitor.       Relevant Medications   pantoprazole (PROTONIX) 40 MG tablet   hyoscyamine (LEVSIN SL) 0.125 MG SL tablet   ondansetron (ZOFRAN) 4 MG tablet   Other Relevant Orders   Comp Met (CMET) (Completed)   CBC (Completed)     Other   Vitamin B12 deficiency    Obtain B12. Continue  injections pending B12 results.       Relevant Medications   cyanocobalamin ((VITAMIN B-12)) injection 1,000 mcg (Completed)   Other Relevant Orders   B12 (Completed)   DYSTHYMIA - Primary    Increased depression secondary to multiple stressors and continued back pain. Denies suicidal ideations. Discussed changing medications. Given increased symptoms recommend counseling and continued dosage of Cymbalta. Start Abilify. Dosage reduced with other medications. Consider changing medications following exacerbation.       Relevant Medications   DULoxetine (CYMBALTA) 60 MG capsule   ALPRAZolam (XANAX) 0.5 MG tablet   Other Relevant Orders   Comp Met (CMET) (Completed)   CBC (Completed)       I have discontinued Ms. Villela's HYDROcodone-acetaminophen and oxyCODONE-acetaminophen. I have also changed her DULoxetine. Additionally, I am having her start on ARIPiprazole. Lastly, I am having her maintain her zoledronic acid, doxylamine (Sleep), cyanocobalamin, morphine, morphine, diphenoxylate-atropine, promethazine, pantoprazole, hyoscyamine, ondansetron, and ALPRAZolam. We administered cyanocobalamin.   Meds ordered this encounter  Medications  . ARIPiprazole (ABILIFY) 2 MG tablet    Sig: Take 1 tablet (2 mg total) by mouth daily.    Dispense:  30 tablet    Refill:  1    Order Specific Question:   Supervising Provider    Answer:   Pricilla Holm A [[id number]]  . pantoprazole (PROTONIX) 40 MG tablet    Sig: TAKE 1 TABLET 30 MINUTES BEFORE BREAKFAST    Dispense:  90 tablet    Refill:  1    Order Specific Question:   Supervising Provider    Answer:   Pricilla Holm A [[id number]]  . hyoscyamine (LEVSIN SL) 0.125 MG SL tablet    Sig: Place 1 tablet (0.125 mg total) under the tongue every 4 (four) hours as needed for cramping.    Dispense:  60 tablet    Refill:  3    Order Specific Question:   Supervising Provider    Answer:   Pricilla Holm A [[id number]]  . ondansetron (ZOFRAN) 4 MG  tablet    Sig: 1 every 6 hours as needed for nausea    Dispense:  30 tablet    Refill:  0    Order Specific Question:   Supervising Provider    Answer:   Sharlet Salina,  ELIZABETH A [[id number]]  . DULoxetine (CYMBALTA) 60 MG capsule    Sig: Take 1 capsule (60 mg total) by mouth daily.    Dispense:  30 capsule    Refill:  2    Order Specific Question:   Supervising Provider    Answer:   Pricilla Holm A [[id number]]  . ALPRAZolam (XANAX) 0.5 MG tablet    Sig: TAKE 1/2 TO 1 TABLET THREE TIMES DAILY AS NEEDED.    Dispense:  90 tablet    Refill:  1    Order Specific Question:   Supervising Provider    Answer:   Pricilla Holm A [[id number]]  . cyanocobalamin ((VITAMIN B-12)) injection 1,000 mcg     Follow-up: Return in about 1 month (around 04/09/2017), or if symptoms worsen or fail to improve.  Mauricio Po, FNP

## 2017-03-09 NOTE — Assessment & Plan Note (Signed)
Increased depression secondary to multiple stressors and continued back pain. Denies suicidal ideations. Discussed changing medications. Given increased symptoms recommend counseling and continued dosage of Cymbalta. Start Abilify. Dosage reduced with other medications. Consider changing medications following exacerbation.

## 2017-03-09 NOTE — Assessment & Plan Note (Signed)
Stable and currently managed by gastroenterology. No current symptoms upon exam. Continue current dosage of hyoscyamine and ondansetron. Continue to monitor.

## 2017-03-10 DIAGNOSIS — M791 Myalgia: Secondary | ICD-10-CM | POA: Diagnosis not present

## 2017-03-10 DIAGNOSIS — M4326 Fusion of spine, lumbar region: Secondary | ICD-10-CM | POA: Diagnosis not present

## 2017-03-15 ENCOUNTER — Other Ambulatory Visit: Payer: Self-pay | Admitting: Orthopaedic Surgery

## 2017-03-15 DIAGNOSIS — M4326 Fusion of spine, lumbar region: Secondary | ICD-10-CM

## 2017-03-17 DIAGNOSIS — Z79891 Long term (current) use of opiate analgesic: Secondary | ICD-10-CM | POA: Diagnosis not present

## 2017-03-17 DIAGNOSIS — M150 Primary generalized (osteo)arthritis: Secondary | ICD-10-CM | POA: Diagnosis not present

## 2017-03-17 DIAGNOSIS — G894 Chronic pain syndrome: Secondary | ICD-10-CM | POA: Diagnosis not present

## 2017-03-17 DIAGNOSIS — M961 Postlaminectomy syndrome, not elsewhere classified: Secondary | ICD-10-CM | POA: Diagnosis not present

## 2017-03-26 ENCOUNTER — Ambulatory Visit
Admission: RE | Admit: 2017-03-26 | Discharge: 2017-03-26 | Disposition: A | Discharge status: Home / Self Care | Payer: Medicare HMO | Source: Ambulatory Visit | Attending: Orthopaedic Surgery | Admitting: Orthopaedic Surgery

## 2017-03-26 DIAGNOSIS — M4326 Fusion of spine, lumbar region: Secondary | ICD-10-CM

## 2017-03-26 DIAGNOSIS — M47816 Spondylosis without myelopathy or radiculopathy, lumbar region: Secondary | ICD-10-CM | POA: Diagnosis not present

## 2017-04-15 DIAGNOSIS — G894 Chronic pain syndrome: Secondary | ICD-10-CM | POA: Diagnosis not present

## 2017-04-15 DIAGNOSIS — M5136 Other intervertebral disc degeneration, lumbar region: Secondary | ICD-10-CM | POA: Diagnosis not present

## 2017-04-15 DIAGNOSIS — M4326 Fusion of spine, lumbar region: Secondary | ICD-10-CM | POA: Diagnosis not present

## 2017-04-22 DIAGNOSIS — G894 Chronic pain syndrome: Secondary | ICD-10-CM | POA: Diagnosis not present

## 2017-04-22 DIAGNOSIS — M961 Postlaminectomy syndrome, not elsewhere classified: Secondary | ICD-10-CM | POA: Diagnosis not present

## 2017-04-22 DIAGNOSIS — M150 Primary generalized (osteo)arthritis: Secondary | ICD-10-CM | POA: Diagnosis not present

## 2017-04-22 DIAGNOSIS — Z79891 Long term (current) use of opiate analgesic: Secondary | ICD-10-CM | POA: Diagnosis not present

## 2017-04-23 DIAGNOSIS — L820 Inflamed seborrheic keratosis: Secondary | ICD-10-CM | POA: Diagnosis not present

## 2017-04-23 DIAGNOSIS — L570 Actinic keratosis: Secondary | ICD-10-CM | POA: Diagnosis not present

## 2017-05-19 DIAGNOSIS — S70311A Abrasion, right thigh, initial encounter: Secondary | ICD-10-CM | POA: Diagnosis not present

## 2017-05-19 DIAGNOSIS — M150 Primary generalized (osteo)arthritis: Secondary | ICD-10-CM | POA: Diagnosis not present

## 2017-05-19 DIAGNOSIS — Z79891 Long term (current) use of opiate analgesic: Secondary | ICD-10-CM | POA: Diagnosis not present

## 2017-05-19 DIAGNOSIS — M961 Postlaminectomy syndrome, not elsewhere classified: Secondary | ICD-10-CM | POA: Diagnosis not present

## 2017-05-19 DIAGNOSIS — M62830 Muscle spasm of back: Secondary | ICD-10-CM | POA: Diagnosis not present

## 2017-05-19 DIAGNOSIS — G894 Chronic pain syndrome: Secondary | ICD-10-CM | POA: Diagnosis not present

## 2017-05-19 DIAGNOSIS — K5900 Constipation, unspecified: Secondary | ICD-10-CM | POA: Diagnosis not present

## 2017-05-20 DIAGNOSIS — G894 Chronic pain syndrome: Secondary | ICD-10-CM | POA: Diagnosis not present

## 2017-05-20 DIAGNOSIS — Z79891 Long term (current) use of opiate analgesic: Secondary | ICD-10-CM | POA: Diagnosis not present

## 2017-05-20 DIAGNOSIS — M150 Primary generalized (osteo)arthritis: Secondary | ICD-10-CM | POA: Diagnosis not present

## 2017-05-20 DIAGNOSIS — M961 Postlaminectomy syndrome, not elsewhere classified: Secondary | ICD-10-CM | POA: Diagnosis not present

## 2017-06-01 DIAGNOSIS — K589 Irritable bowel syndrome without diarrhea: Secondary | ICD-10-CM | POA: Diagnosis not present

## 2017-06-01 DIAGNOSIS — M545 Low back pain: Secondary | ICD-10-CM | POA: Diagnosis not present

## 2017-06-01 DIAGNOSIS — Z79899 Other long term (current) drug therapy: Secondary | ICD-10-CM | POA: Diagnosis not present

## 2017-06-01 DIAGNOSIS — M4800 Spinal stenosis, site unspecified: Secondary | ICD-10-CM | POA: Diagnosis not present

## 2017-06-01 DIAGNOSIS — R69 Illness, unspecified: Secondary | ICD-10-CM | POA: Diagnosis not present

## 2017-06-01 DIAGNOSIS — K219 Gastro-esophageal reflux disease without esophagitis: Secondary | ICD-10-CM | POA: Diagnosis not present

## 2017-06-01 DIAGNOSIS — Z6821 Body mass index (BMI) 21.0-21.9, adult: Secondary | ICD-10-CM | POA: Diagnosis not present

## 2017-06-01 DIAGNOSIS — Z0000 Encounter for general adult medical examination without abnormal findings: Secondary | ICD-10-CM | POA: Diagnosis not present

## 2017-06-01 DIAGNOSIS — R0989 Other specified symptoms and signs involving the circulatory and respiratory systems: Secondary | ICD-10-CM | POA: Diagnosis not present

## 2017-06-01 DIAGNOSIS — G479 Sleep disorder, unspecified: Secondary | ICD-10-CM | POA: Diagnosis not present

## 2017-06-01 DIAGNOSIS — Z79891 Long term (current) use of opiate analgesic: Secondary | ICD-10-CM | POA: Diagnosis not present

## 2017-06-02 DIAGNOSIS — R202 Paresthesia of skin: Secondary | ICD-10-CM | POA: Diagnosis not present

## 2017-06-02 DIAGNOSIS — L910 Hypertrophic scar: Secondary | ICD-10-CM | POA: Diagnosis not present

## 2017-06-08 ENCOUNTER — Other Ambulatory Visit: Payer: Self-pay | Admitting: Family

## 2017-06-10 ENCOUNTER — Ambulatory Visit (INDEPENDENT_AMBULATORY_CARE_PROVIDER_SITE_OTHER): Payer: Medicare HMO | Admitting: Internal Medicine

## 2017-06-10 ENCOUNTER — Encounter: Payer: Self-pay | Admitting: Internal Medicine

## 2017-06-10 VITALS — BP 144/90 | HR 96 | Ht 63.0 in | Wt 117.0 lb

## 2017-06-10 DIAGNOSIS — R05 Cough: Secondary | ICD-10-CM | POA: Diagnosis not present

## 2017-06-10 DIAGNOSIS — R059 Cough, unspecified: Secondary | ICD-10-CM

## 2017-06-10 MED ORDER — HYDROCODONE-HOMATROPINE 5-1.5 MG/5ML PO SYRP: 5.0000 mL | ORAL_SOLUTION | Freq: Four times a day (QID) | ORAL | 0 refills | Status: AC | PRN

## 2017-06-10 MED ORDER — AMOXICILLIN-POT CLAVULANATE 875-125 MG PO TABS: 1.0000 | ORAL_TABLET | Freq: Two times a day (BID) | ORAL | 0 refills | Status: DC

## 2017-06-10 NOTE — Patient Instructions (Signed)
Please take all new medication as prescribed - the antibiotic, and cough medicine if needed  Please continue all other medications as before, and refills have been done if requested.  Please have the pharmacy call with any other refills you may need.  Please keep your appointments with your specialists as you may have planned  Please go to the XRAY Department in the Basement (go straight as you get off the elevator) for the x-ray testing tomorrow  You will be contacted by phone if any changes need to be made immediately.  Otherwise, you will receive a letter about your results with an explanation, but please check with MyChart first.  Please remember to sign up for MyChart if you have not done so, as this will be important to you in the future with finding out test results, communicating by private email, and scheduling acute appointments online when needed.

## 2017-06-10 NOTE — Progress Notes (Signed)
Subjective:    Patient ID: Denise King, female    DOB: March 30, 1951, 66 y.o.   MRN: [id number]  HPI  Here with acute onset mild to mod 2-3 days ST, HA, general weakness and malaise, with prod cough greenish sputum, but Pt denies chest pain, increased sob or doe, wheezing, orthopnea, PND, increased LE swelling, palpitations, dizziness or syncope. Pt denies new neurological symptoms such as new headache, or facial or extremity weakness or numbness   Pt denies polydipsia, polyuria.   Past Medical History:  Diagnosis Date  . Acute cystitis   . Allergy    as a child grew out of them  . Anemia    pernicious anemia  . Anxiety   . Arthritis   . Back pain   . Blood transfusion   . Chronic pain syndrome   . Depression   . Diverticulosis of colon   . Dysthymia   . Gastritis   . GERD (gastroesophageal reflux disease)   . H/O chest pain Dec. 2013   no work up done  . Hx of colonic polyps   . Hypertension   . Irritable bowel syndrome   . Osteopenia   . Osteoporosis   . Reflux esophagitis   . Tobacco use disorder   . Unspecified chronic bronchitis ([place])   . Vertigo   . Vitamin B12 deficiency   . Wears glasses    Past Surgical History:  Procedure Laterality Date  . APPENDECTOMY    . BACK SURGERY  10-09   Dr. Patrice Paradise  . CARPAL TUNNEL RELEASE Right 08/14/2014   Procedure: RIGHT CARPAL TUNNEL RELEASE AND INJECT LEFT THUMB;  Surgeon: Daryll Brod, MD;  Location: [facility];  Service: Orthopedics;  Laterality: Right;  . COLONOSCOPY    . LAPAROSCOPY N/A 02/21/2013   Procedure: LAPAROSCOPY OPERATIVE;  Surgeon: Margarette Asal, MD;  Location: [facility];  Service: Gynecology;  Laterality: N/A;  REQUESTING 5MM SCOPE WITH CAMERA  . TONSILLECTOMY    . TUBAL LIGATION    . UPPER GASTROINTESTINAL ENDOSCOPY      reports that she has been smoking Cigarettes.  She has a 30.00 pack-year smoking history. She has never used smokeless tobacco. She reports that she drinks alcohol. She  reports that she does not use drugs. family history includes Colon cancer in her brother and father; Heart disease in her mother; Prostate cancer in her father; Skin cancer in her sister. Allergies  Allergen Reactions  . Atorvastatin Nausea And Vomiting  . Levofloxacin     Reaction: achilles tendon pain  . Omnicef [Cefdinir] Diarrhea    Uncontrollable diarrhea  . Trazodone And Nefazodone     "Felt like I was going to faint"  . Sulfonamide Derivatives Rash   Current Outpatient Prescriptions on File Prior to Visit  Medication Sig Dispense Refill  . ALPRAZolam (XANAX) 0.5 MG tablet TAKE 1/2 TO 1 TABLET THREE TIMES DAILY AS NEEDED. 90 tablet 1  . ARIPiprazole (ABILIFY) 2 MG tablet Take 1 tablet (2 mg total) by mouth daily. 30 tablet 1  . cyanocobalamin (,VITAMIN B-12,) 1000 MCG/ML injection Inject 1,000 mcg into the muscle every 30 (thirty) days.    . diphenoxylate-atropine (LOMOTIL) 2.5-0.025 MG tablet Take 1 tablet by mouth 4 (four) times daily as needed for diarrhea or loose stools (may take 2). 90 tablet 0  . Doxylamine Succinate, Sleep, (UNISOM) 25 MG tablet Take 25 mg by mouth at bedtime.     . DULoxetine (CYMBALTA) 60 MG capsule TAKE  1 CAPSULE DAILY. 30 capsule 2  . hyoscyamine (LEVSIN SL) 0.125 MG SL tablet Place 1 tablet (0.125 mg total) under the tongue every 4 (four) hours as needed for cramping. 60 tablet 3  . morphine (MS CONTIN) 15 MG 12 hr tablet Take 15 mg by mouth every 12 (twelve) hours. Take with 60 mg to make 75 mg    . morphine (MS CONTIN) 60 MG 12 hr tablet Take 60 mg by mouth every 12 (twelve) hours. Take with 15 mg to make 75 mg    . ondansetron (ZOFRAN) 4 MG tablet 1 every 6 hours as needed for nausea 30 tablet 0  . pantoprazole (PROTONIX) 40 MG tablet TAKE 1 TABLET 30 MINUTES BEFORE BREAKFAST 90 tablet 1  . promethazine (PHENERGAN) 25 MG suppository Insert unwrapped suppository every 8-12 hours 12 each 0  . zoledronic acid (RECLAST) 5 MG/100ML SOLN Inject 5 mg into  the vein as directed. Once a year...     No current facility-administered medications on file prior to visit.    Review of Systems All otherwise neg per pt    Objective:   Physical Exam BP (!) 144/90   Pulse 96   Ht 5\' 3"  (1.6 m)   Wt 117 lb (53.1 kg)   SpO2 97%   BMI 20.73 kg/m  VS noted, mild ill Constitutional: Pt appears in NAD HENT: Head: NCAT.  Right Ear: External ear normal.  Left Ear: External ear normal.  Eyes: . Pupils are equal, round, and reactive to light. Conjunctivae and EOM are normal Bilat tm's with mild erythema.  Max sinus areas non tender.  Pharynx with mild erythema, no exudate Nose: without d/c or deformity Neck: Neck supple. Gross normal ROM Cardiovascular: Normal rate and regular rhythm.   Pulmonary/Chest: Effort normal and breath sounds decreased without rales or wheezing.  Neurological: Pt is alert. At baseline orientation, motor grossly intact Skin: Skin is warm. No rashes, other new lesions, no LE edema Psychiatric: Pt behavior is normal without agitation  No other exam findings    Assessment & Plan:

## 2017-06-13 NOTE — Assessment & Plan Note (Addendum)
Mild to mod, c/w bronchitis vs pna, declines cxr tonight but will return in AM, for antibx course,  to f/u any worsening symptoms or concerns

## 2017-06-17 DIAGNOSIS — G894 Chronic pain syndrome: Secondary | ICD-10-CM | POA: Diagnosis not present

## 2017-06-17 DIAGNOSIS — M961 Postlaminectomy syndrome, not elsewhere classified: Secondary | ICD-10-CM | POA: Diagnosis not present

## 2017-06-17 DIAGNOSIS — M150 Primary generalized (osteo)arthritis: Secondary | ICD-10-CM | POA: Diagnosis not present

## 2017-06-17 DIAGNOSIS — Z79891 Long term (current) use of opiate analgesic: Secondary | ICD-10-CM | POA: Diagnosis not present

## 2017-06-18 ENCOUNTER — Telehealth: Payer: Self-pay | Admitting: Family

## 2017-06-18 MED ORDER — AZITHROMYCIN 250 MG PO TABS: ORAL_TABLET | ORAL | 0 refills | Status: DC

## 2017-06-18 NOTE — Telephone Encounter (Signed)
She can try a pro-biotic or immodium to help with the diarrhea. We will send in azithromycin for change in antibiotic.

## 2017-06-18 NOTE — Telephone Encounter (Signed)
Pt is having an allergic reaction to some antibiotic that she was given.  She would like a nurse to give her a call

## 2017-06-18 NOTE — Telephone Encounter (Signed)
Pt.notified

## 2017-06-18 NOTE — Telephone Encounter (Signed)
Patient sates she was given Amoxicillin on the 15th. She took 1 that night and 2 the next day and started to get really bad loose diarrhea after that and stopped taking the medication. She would like to know what she needs to do. Whether its a different antibiotic or something to help with the diarrhea. Please advise.

## 2017-07-07 ENCOUNTER — Other Ambulatory Visit: Payer: Self-pay | Admitting: Family

## 2017-07-08 NOTE — Telephone Encounter (Signed)
Faxed script back to [facility].../lmb 

## 2017-07-15 IMAGING — CR DG CHEST 2V
2 series · 2 of 2 positions shown · non-contrast
Comparison: None.

CLINICAL DATA: Patient with right flank pain for 2-3 days. No known
injury.

EXAM:
CHEST  2 VIEW

[lateral]
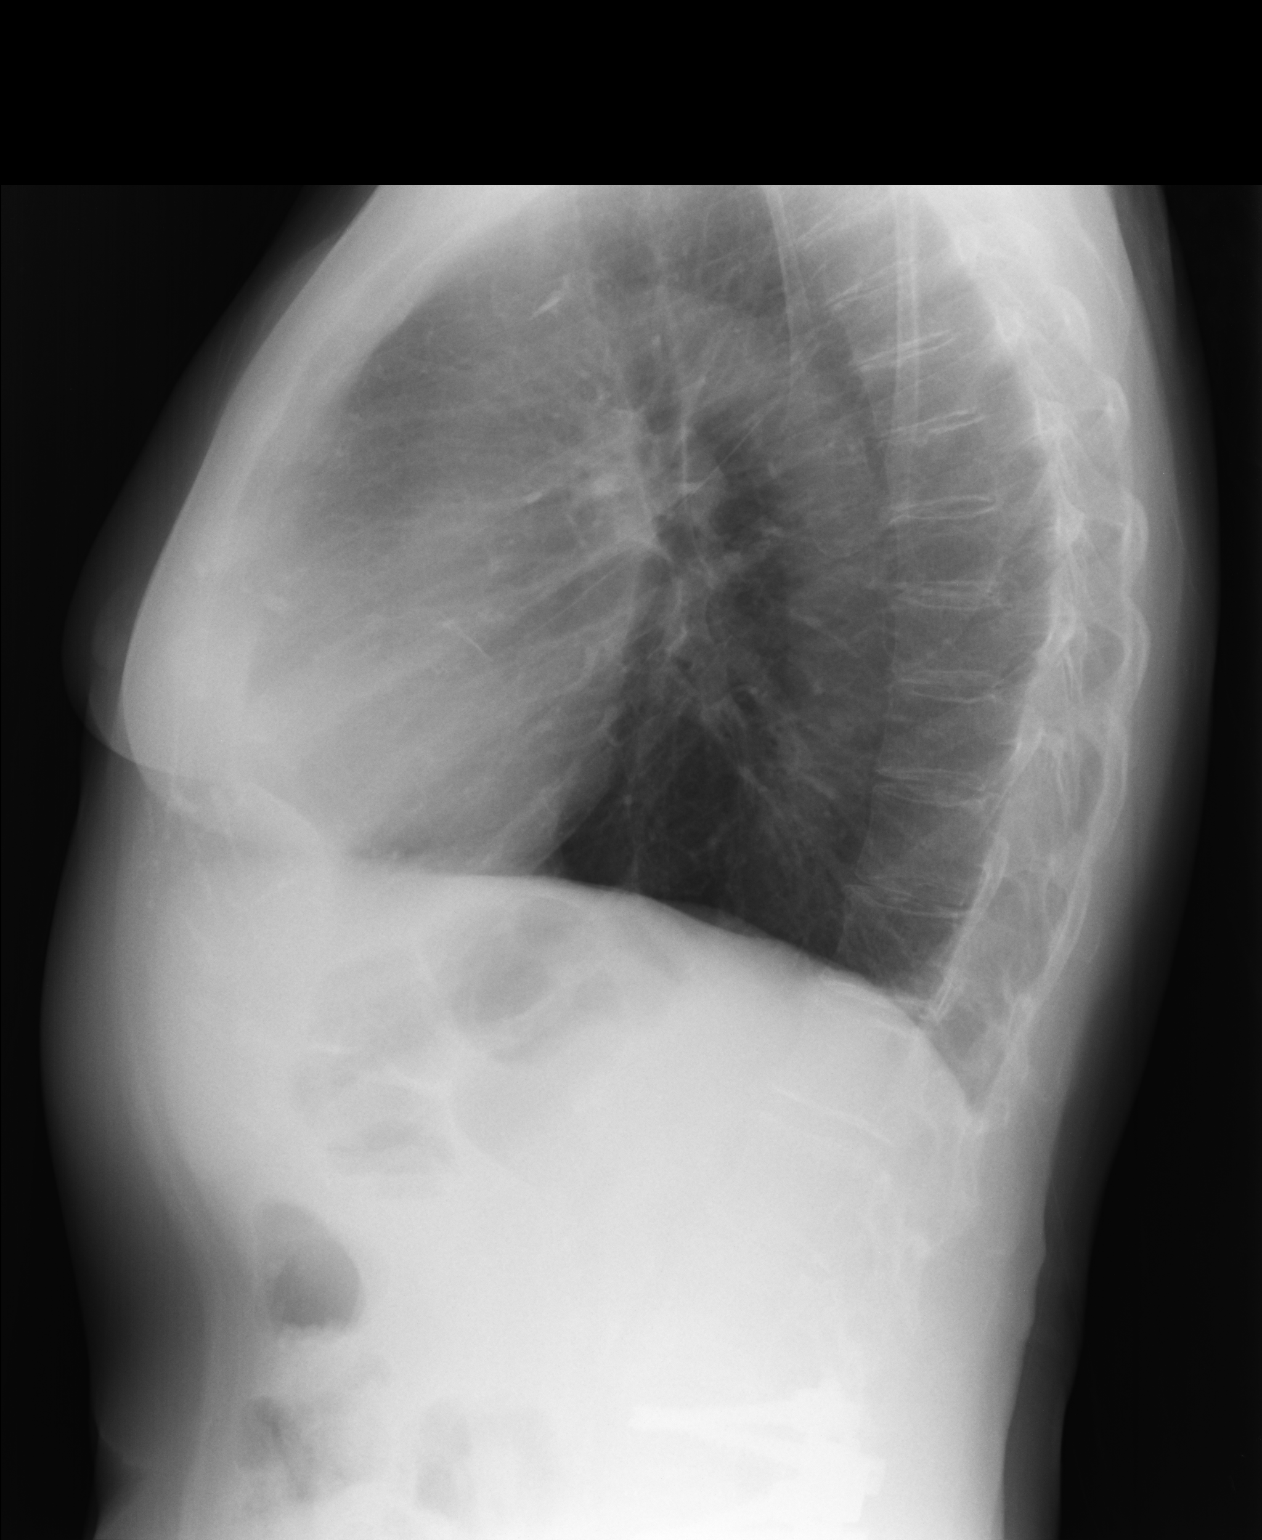

[PA]
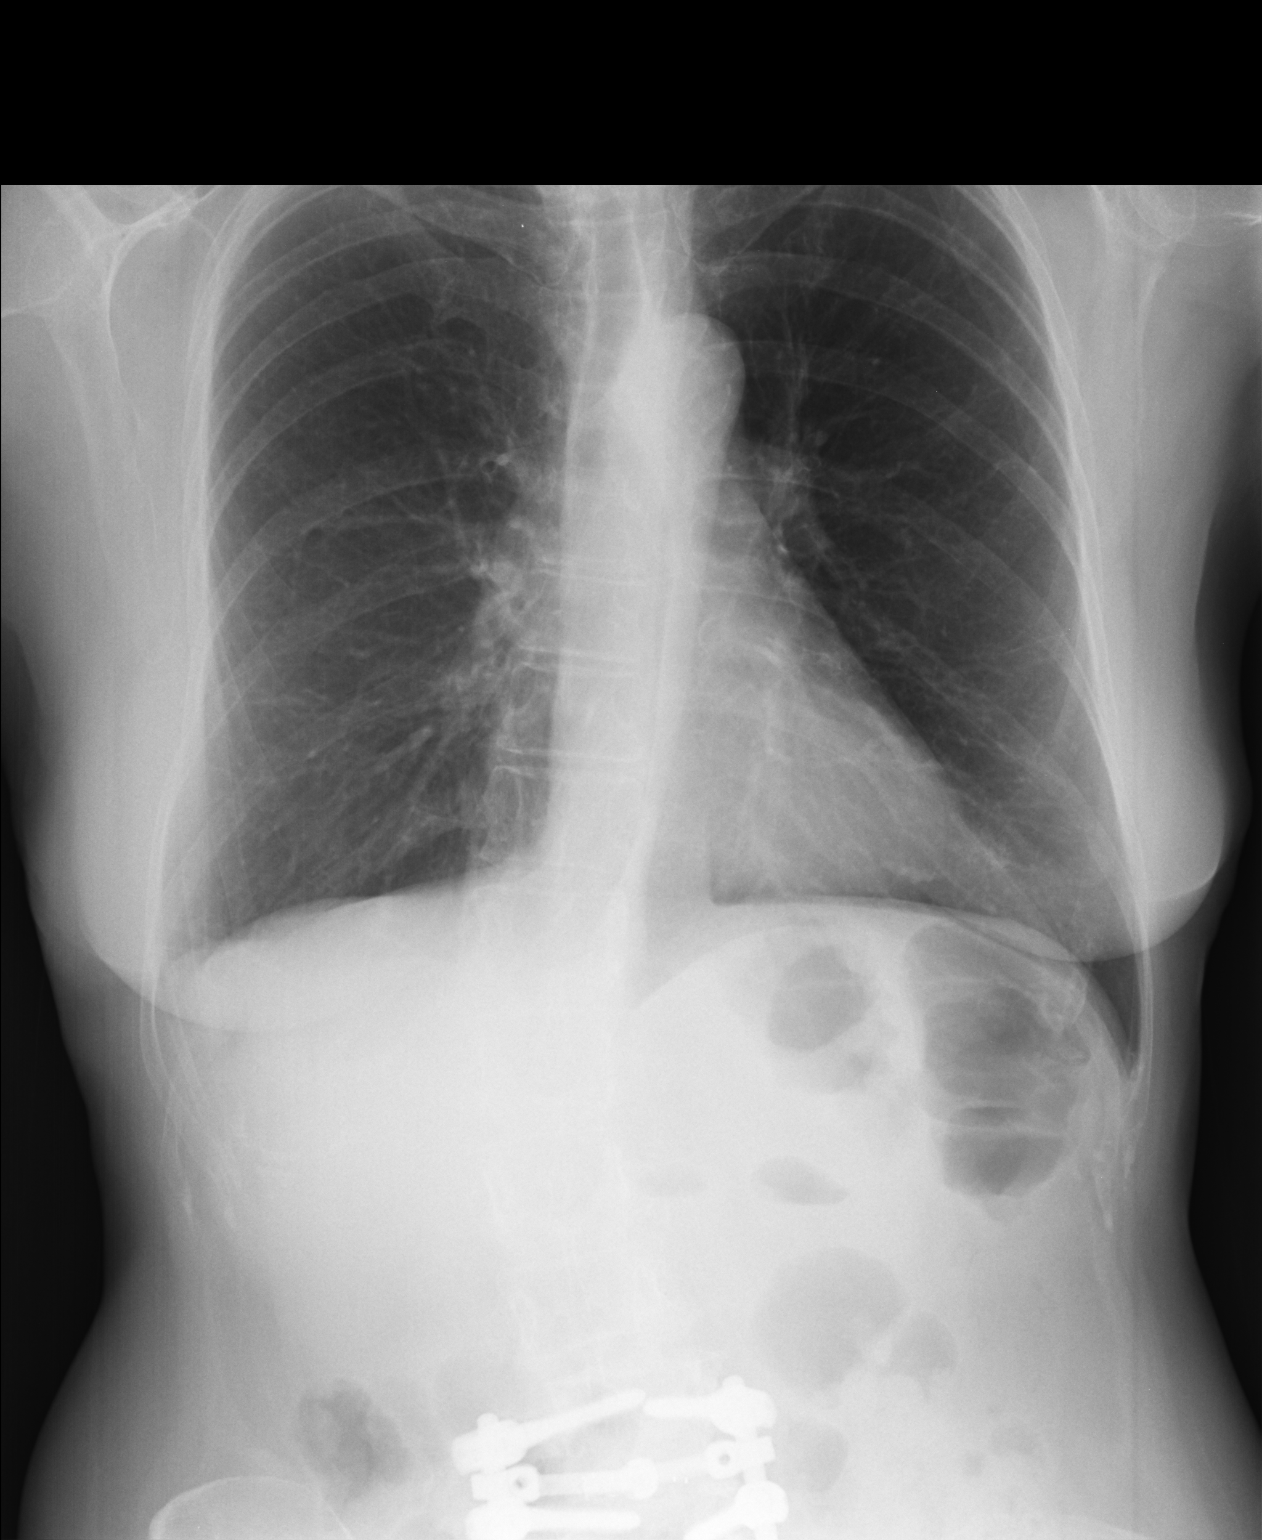

[2 of 2 positions shown; findings below may reference images not displayed]

FINDINGS: Normal cardiac contours. No consolidative pulmonary opacities. No
pleural effusion or pneumothorax. Lumbar spinal fusion hardware.
Rightward curvature of the lumbar spine. Cannot exclude nondisplaced
right lateral ninth rib fracture.
IMPRESSION: Cannot exclude right lateral nondisplaced ninth rib fracture.

No acute cardiopulmonary process.

## 2017-07-17 ENCOUNTER — Emergency Department (HOSPITAL_COMMUNITY): Payer: Medicare HMO

## 2017-07-17 ENCOUNTER — Ambulatory Visit (INDEPENDENT_AMBULATORY_CARE_PROVIDER_SITE_OTHER): Payer: Medicare HMO | Admitting: Family Medicine

## 2017-07-17 ENCOUNTER — Emergency Department (HOSPITAL_COMMUNITY)
Admission: EM | Admit: 2017-07-17 | Discharge: 2017-07-17 | Disposition: A | Discharge status: Home / Self Care | Payer: Medicare HMO | Attending: Emergency Medicine | Admitting: Emergency Medicine

## 2017-07-17 ENCOUNTER — Encounter: Payer: Self-pay | Admitting: Family Medicine

## 2017-07-17 ENCOUNTER — Encounter (HOSPITAL_COMMUNITY): Payer: Self-pay | Admitting: Emergency Medicine

## 2017-07-17 VITALS — BP 140/88 | HR 89 | Temp 98.3°F

## 2017-07-17 DIAGNOSIS — R1110 Vomiting, unspecified: Secondary | ICD-10-CM | POA: Diagnosis not present

## 2017-07-17 DIAGNOSIS — R112 Nausea with vomiting, unspecified: Secondary | ICD-10-CM | POA: Diagnosis not present

## 2017-07-17 DIAGNOSIS — R69 Illness, unspecified: Secondary | ICD-10-CM | POA: Diagnosis not present

## 2017-07-17 DIAGNOSIS — R1033 Periumbilical pain: Secondary | ICD-10-CM | POA: Diagnosis not present

## 2017-07-17 DIAGNOSIS — Z79899 Other long term (current) drug therapy: Secondary | ICD-10-CM | POA: Diagnosis not present

## 2017-07-17 DIAGNOSIS — R197 Diarrhea, unspecified: Secondary | ICD-10-CM | POA: Diagnosis not present

## 2017-07-17 DIAGNOSIS — K529 Noninfective gastroenteritis and colitis, unspecified: Secondary | ICD-10-CM | POA: Diagnosis not present

## 2017-07-17 DIAGNOSIS — F17210 Nicotine dependence, cigarettes, uncomplicated: Secondary | ICD-10-CM | POA: Diagnosis not present

## 2017-07-17 DIAGNOSIS — R110 Nausea: Secondary | ICD-10-CM | POA: Diagnosis present

## 2017-07-17 LAB — COMPREHENSIVE METABOLIC PANEL
ALT: 18 U/L (ref 14–54)
AST: 26 U/L (ref 15–41)
Albumin: 3.5 g/dL (ref 3.5–5.0)
Alkaline Phosphatase: 90 U/L (ref 38–126)
Anion gap: 10 (ref 5–15)
BUN: <5 mg/dL — ABNORMAL LOW (ref 6–20)
CO2: 31 mmol/L (ref 22–32)
Calcium: 8.8 mg/dL — ABNORMAL LOW (ref 8.9–10.3)
Chloride: 95 mmol/L — ABNORMAL LOW (ref 101–111)
Creatinine, Ser: 0.6 mg/dL (ref 0.44–1.00)
GFR calc Af Amer: >60 mL/min (ref >60)
GFR calc non Af Amer: >60 mL/min (ref >60)
Glucose, Bld: 116 mg/dL — ABNORMAL HIGH (ref 65–99)
Potassium: 3.6 mmol/L (ref 3.5–5.1)
Sodium: 136 mmol/L (ref 135–145)
Total Bilirubin: 0.8 mg/dL (ref 0.3–1.2)
Total Protein: 6.9 g/dL (ref 6.5–8.1)

## 2017-07-17 LAB — URINALYSIS, ROUTINE W REFLEX MICROSCOPIC
Bacteria, UA: NONE SEEN
Bilirubin Urine: NEGATIVE
Glucose, UA: NEGATIVE mg/dL
Ketones, ur: NEGATIVE mg/dL
Leukocytes, UA: NEGATIVE
Nitrite: NEGATIVE
Protein, ur: NEGATIVE mg/dL
Specific Gravity, Urine: 1.005 (ref 1.005–1.030)
pH: 7 (ref 5.0–8.0)

## 2017-07-17 LAB — CBC
HCT: 53.6 % — ABNORMAL HIGH (ref 36.0–46.0)
Hemoglobin: 19.6 g/dL — ABNORMAL HIGH (ref 12.0–15.0)
MCH: 36.3 pg — ABNORMAL HIGH (ref 26.0–34.0)
MCHC: 36.6 g/dL — ABNORMAL HIGH (ref 30.0–36.0)
MCV: 99.3 fL (ref 78.0–100.0)
Platelets: 203 10*3/uL (ref 150–400)
RBC: 5.4 MIL/uL — ABNORMAL HIGH (ref 3.87–5.11)
RDW: 13.2 % (ref 11.5–15.5)
WBC: 6.7 10*3/uL (ref 4.0–10.5)

## 2017-07-17 LAB — LIPASE, BLOOD: Lipase: 16 U/L (ref 11–51)

## 2017-07-17 MED ORDER — PROMETHAZINE HCL 12.5 MG PO TABS: 12.5000 mg | ORAL_TABLET | Freq: Four times a day (QID) | ORAL | 0 refills | Status: DC | PRN

## 2017-07-17 MED ORDER — ONDANSETRON HCL 4 MG/2ML IJ SOLN
4.0000 mg | Freq: Once | INTRAMUSCULAR | Status: AC
  Administered 2017-07-17: 4 mg via INTRAVENOUS
  Filled 2017-07-17: qty 2

## 2017-07-17 MED ORDER — PROMETHAZINE HCL 25 MG/ML IJ SOLN
25.0000 mg | Freq: Once | INTRAMUSCULAR | Status: AC
  Administered 2017-07-17: 25 mg via INTRAVENOUS
  Filled 2017-07-17: qty 1

## 2017-07-17 MED ORDER — SODIUM CHLORIDE 0.9 % IV BOLUS (SEPSIS)
1000.0000 mL | Freq: Once | INTRAVENOUS | Status: AC
  Administered 2017-07-17: 1000 mL via INTRAVENOUS

## 2017-07-17 MED ORDER — MORPHINE SULFATE (PF) 4 MG/ML IV SOLN
4.0000 mg | Freq: Once | INTRAVENOUS | Status: AC
  Administered 2017-07-17: 4 mg via INTRAVENOUS
  Filled 2017-07-17: qty 1

## 2017-07-17 MED ORDER — IOPAMIDOL (ISOVUE-300) INJECTION 61%
INTRAVENOUS | Status: AC
  Administered 2017-07-17: 100 mL via INTRAVENOUS
  Filled 2017-07-17: qty 100

## 2017-07-17 NOTE — Discharge Instructions (Signed)
Please read attached information. If you experience any new or worsening signs or symptoms please return to the emergency room for evaluation. Please follow-up with your primary care provider or specialist as discussed. Please use medication prescribed only as directed and discontinue taking if you have any concerning signs or symptoms.   °

## 2017-07-17 NOTE — ED Provider Notes (Signed)
65 year old female signed out to me at shift change pending fluid challenge.  Please see previous providers note for full H&P.  Patient presents with nausea vomiting diarrhea likely gastroenteritis.  She had reassuring workup with no significant electrolyte abnormalities, no signs of significant infection.  She had a CT scan that was reassuring.  Patient was given Phenergan which significantly improved her symptoms.  She is tolerating p.o.  Patient will be discharged home with Phenergan, care instructions and return precautions.  She verbalized understanding and agreement to today's plan had no further questions or concerns the time discharge.  Vitals:   07/17/17 1311 07/17/17 1516  BP: (!) 143/93 (!) 143/75  Pulse: 84 83  Resp: 16 16  Temp: 98.2 F (36.8 C) 97.7 F (36.5 C)      Francee Gentile 07/17/17 1655    Gareth Morgan, MD 07/17/17 2307

## 2017-07-17 NOTE — ED Triage Notes (Signed)
Pt comes in after being sent by her PCP for complaints of throwing up since Tuesday.  Had one episode of diarrhea on Wednesday. Pt endorses some abdominal cramping.  States one of her coworkers recently had a GI bug for a week and she is not sure if she picked it up from her.

## 2017-07-17 NOTE — Progress Notes (Signed)
Subjective:    Patient ID: Denise King, female    DOB: [birth year], 66 y.o.   MRN: [id number]  HPI Here for n/v   Pt suspect she has gb problems   Last Tuesday stomach was bloated and hurt all over  Thought it was her ibs  Wednesday had some loose stool/ dark in color   (no iron supplementation) no pepto  Has not had a bm since then  Throwing up (no dark material or coffee ground emesis)   Wt Readings from Last 3 Encounters:  06/10/17 117 lb (53.1 kg)  03/09/17 118 lb (53.5 kg)  11/03/16 115 lb (52.2 kg)   zofran 4 mg - is not helping   She takes pain medications for chronic back pain  These are not new and do not make he nauseated   protonix - only take prn  Started it when her symptoms started   No hx of PUD / has had esophagitis in the past   Unable to keep anything down   Still has gallbladder No longer has appendix    Patient Active Problem List   Diagnosis Date Noted  . Nausea & vomiting 07/17/2017  . Cough 06/10/2017  . Diarrhea 04/22/2016  . Family history of colon cancer - brother and father 03/10/2016  . Insomnia 02/11/2016  . Generalized anxiety disorder 11/25/2015  . Urinary frequency 04/25/2015  . Nausea and vomiting in adult 11/08/2014  . Cigarette smoker 08/15/2013  . Otitis media 10/27/2012  . Abdominal pain, generalized 03/30/2012  . HEPATIC CYST 02/18/2010  . Chronic pain syndrome 02/08/2010  . Vitamin B12 deficiency 10/04/2009  . GERD 01/04/2009  . ABDOMINAL PAIN-EPIGASTRIC 01/04/2009  . DYSTHYMIA 08/10/2008  . VENOUS INSUFFICIENCY 08/10/2008  . Irritable bowel syndrome 08/10/2008  . Back pain 08/10/2008  . CIGARETTE SMOKER 03/06/2008  . Hx of adenomatous polyp of colon 12/06/2007  . BRONCHITIS, RECURRENT 12/06/2007  . Reflux esophagitis 12/06/2007  . DIVERTICULOSIS OF COLON 12/06/2007  . OSTEOPENIA 12/06/2007  . CALCULUS, KIDNEY 10/07/2007  . VERTIGO 10/07/2007  . Headache(784.0) 10/07/2007   Past Medical History:    Diagnosis Date  . Acute cystitis   . Allergy    as a child grew out of them  . Anemia    pernicious anemia  . Anxiety   . Arthritis   . Back pain   . Blood transfusion   . Chronic pain syndrome   . Depression   . Diverticulosis of colon   . Dysthymia   . Gastritis   . GERD (gastroesophageal reflux disease)   . H/O chest pain Dec. 2013   no work up done  . Hx of colonic polyps   . Hypertension   . Irritable bowel syndrome   . Osteopenia   . Osteoporosis   . Reflux esophagitis   . Tobacco use disorder   . Unspecified chronic bronchitis ([place])   . Vertigo   . Vitamin B12 deficiency   . Wears glasses    Past Surgical History:  Procedure Laterality Date  . APPENDECTOMY    . BACK SURGERY  10-09   Dr. Patrice Paradise  . CARPAL TUNNEL RELEASE Right 08/14/2014   Procedure: RIGHT CARPAL TUNNEL RELEASE AND INJECT LEFT THUMB;  Surgeon: Daryll Brod, MD;  Location: [facility];  Service: Orthopedics;  Laterality: Right;  . COLONOSCOPY    . LAPAROSCOPY N/A 02/21/2013   Procedure: LAPAROSCOPY OPERATIVE;  Surgeon: Margarette Asal, MD;  Location: [facility];  Service: Gynecology;  Laterality:  N/A;  REQUESTING 5MM SCOPE WITH CAMERA  . TONSILLECTOMY    . TUBAL LIGATION    . UPPER GASTROINTESTINAL ENDOSCOPY     Social History  Substance Use Topics  . Smoking status: Current Every Day Smoker    Packs/day: 1.00    Years: 30.00    Types: Cigarettes  . Smokeless tobacco: Never Used  . Alcohol use 0.0 oz/week     Comment: occasional   Family History  Problem Relation Age of Onset  . Colon cancer Father   . Prostate cancer Father   . Heart disease Mother        prev MVR, also has DJD  . Colon cancer Brother   . Skin cancer Sister   . Esophageal cancer Neg Hx   . Rectal cancer Neg Hx   . Stomach cancer Neg Hx    Allergies  Allergen Reactions  . Atorvastatin Nausea And Vomiting  . Levofloxacin     Reaction: achilles tendon pain  . Omnicef [Cefdinir] Diarrhea     Uncontrollable diarrhea  . Trazodone And Nefazodone     "Felt like I was going to faint"  . Sulfonamide Derivatives Rash   Current Outpatient Prescriptions on File Prior to Visit  Medication Sig Dispense Refill  . ALPRAZolam (XANAX) 0.5 MG tablet TAKE 1/2 TO 1 TABLET THREE TIMES DAILY AS NEEDED. 90 tablet 0  . ARIPiprazole (ABILIFY) 2 MG tablet Take 1 tablet (2 mg total) by mouth daily. 30 tablet 1  . cyanocobalamin (,VITAMIN B-12,) 1000 MCG/ML injection Inject 1,000 mcg into the muscle every 30 (thirty) days.    . diphenoxylate-atropine (LOMOTIL) 2.5-0.025 MG tablet Take 1 tablet by mouth 4 (four) times daily as needed for diarrhea or loose stools (may take 2). 90 tablet 0  . Doxylamine Succinate, Sleep, (UNISOM) 25 MG tablet Take 25 mg by mouth at bedtime.     . DULoxetine (CYMBALTA) 60 MG capsule TAKE 1 CAPSULE DAILY. 30 capsule 2  . hyoscyamine (LEVSIN SL) 0.125 MG SL tablet Place 1 tablet (0.125 mg total) under the tongue every 4 (four) hours as needed for cramping. 60 tablet 3  . morphine (MS CONTIN) 15 MG 12 hr tablet Take 15 mg by mouth every 12 (twelve) hours. Take with 60 mg to make 75 mg    . morphine (MS CONTIN) 60 MG 12 hr tablet Take 60 mg by mouth every 12 (twelve) hours. Take with 15 mg to make 75 mg    . ondansetron (ZOFRAN) 4 MG tablet TAKE (1) TABLET EVERY SIX HOURS AS NEEDED FOR NAUSEA. 30 tablet 0  . pantoprazole (PROTONIX) 40 MG tablet TAKE 1 TABLET 30 MINUTES BEFORE BREAKFAST 90 tablet 1  . promethazine (PHENERGAN) 25 MG suppository Insert unwrapped suppository every 8-12 hours 12 each 0  . zoledronic acid (RECLAST) 5 MG/100ML SOLN Inject 5 mg into the vein as directed. Once a year...     No current facility-administered medications on file prior to visit.     Review of Systems    Review of Systems  Constitutional: Negative for fever, appetite change,  and unexpected weight change.  Eyes: Negative for pain and visual disturbance.  ENT neg for st or rhinorrhea    Respiratory: Negative for cough and shortness of breath.   Cardiovascular: Negative for cp or palpitations    Gastrointestinal: pos for n/v and black stool and epigastric pain / neg for brb per rectum .  Genitourinary: Negative for urgency and frequency. neg for dysuria or hematuria  Skin: Negative for pallor or rash   Neurological: Negative for weakness, light-headedness, numbness and headaches.  Hematological: Negative for adenopathy. Does not bruise/bleed easily.  Psychiatric/Behavioral: Negative for dysphoric mood. The patient is not nervous/anxious.      Objective:   Physical Exam  Constitutional: She appears well-developed and well-nourished. No distress.  Ill appearing female/with generalized weakness  HENT:  Head: Normocephalic and atraumatic.  Mouth/Throat: Oropharynx is clear and moist.  MMM are dry  Eyes: Pupils are equal, round, and reactive to light. Conjunctivae and EOM are normal. No scleral icterus.  Neck: Normal range of motion. Neck supple.  Cardiovascular: Normal rate, regular rhythm and normal heart sounds.   Pulmonary/Chest: Effort normal and breath sounds normal. No respiratory distress. She has no wheezes. She has no rales.  Harsh bs with good air exch  Abdominal: Soft. Bowel sounds are normal. She exhibits no distension, no abdominal bruit, no pulsatile midline mass and no mass. There is no hepatosplenomegaly. There is tenderness in the epigastric area and periumbilical area. There is no rebound, no guarding, no CVA tenderness, no tenderness at McBurney's point and negative Murphy's sign.  Exam done sitting - too weak to get up to table   Lymphadenopathy:    She has no cervical adenopathy.  Neurological: She is alert.  Skin: Skin is warm and dry. No erythema. No pallor.  Slow capillary refill   Psychiatric: She has a normal mood and affect.          Assessment & Plan:   Problem List Items Addressed This Visit      Digestive   Nausea & vomiting     Progressive since Wednesday with inability to keep any food or fluids down  Exam consistent with dehydration  Epigastric pain worrisome in setting of reported black /tarry stool for upper GI bleed  Sent directly to ED at [facility] by car across the street and triage called to alert of her arrival  Will likely need IV fluids/ stat labs and poss imaging or GI input   Pt in agreement and husband took to ED

## 2017-07-17 NOTE — Patient Instructions (Signed)
To to the ED at [facility] for further evaluation and treatment of dehydration

## 2017-07-17 NOTE — Assessment & Plan Note (Signed)
Progressive since Wednesday with inability to keep any food or fluids down  Exam consistent with dehydration  Epigastric pain worrisome in setting of reported black /tarry stool for upper GI bleed  Sent directly to ED at [facility] by car across the street and triage called to alert of her arrival  Will likely need IV fluids/ stat labs and poss imaging or GI input   Pt in agreement and husband took to ED

## 2017-07-17 NOTE — ED Notes (Signed)
Pt reports taking zofran without relief.

## 2017-07-17 NOTE — ED Notes (Signed)
Patient transported to CT 

## 2017-07-17 NOTE — ED Notes (Signed)
Gave pt ginger ale.  

## 2017-07-17 NOTE — ED Provider Notes (Signed)
[facility] DEPT Provider Note   CSN: [id number] Arrival date & time: 07/17/17  1302     History   Chief Complaint Chief Complaint  Patient presents with  . Emesis  . Abdominal Pain    HPI Denise King is a 66 y.o. female.  HPI Patient presents to the emergency department with nausea, vomiting, diarrhea and abdominal discomfort that started Tuesday evening.  The patient states that she has had persistent nausea, vomiting with diarrhea that stopped on Wednesday.  The patient states that she has been around someone that was ill with similar symptoms.  The patient states the pain is mainly mid abdomen.  She states her is no radiation of her pain.  She states she did not take any medications prior to arrival for her symptoms.  Patient states that nothing  seems make her condition better.  Palpation makes the pain worseThe patient denies chest pain, shortness of breath, headache,blurred vision, neck pain, fever, cough, weakness, numbness, dizziness, anorexia, edema,  rash, back pain, dysuria, hematemesis, bloody stool, near syncope, or syncope. Past Medical History:  Diagnosis Date  . Acute cystitis   . Allergy    as a child grew out of them  . Anemia    pernicious anemia  . Anxiety   . Arthritis   . Back pain   . Blood transfusion   . Chronic pain syndrome   . Depression   . Diverticulosis of colon   . Dysthymia   . Gastritis   . GERD (gastroesophageal reflux disease)   . H/O chest pain Dec. 2013   no work up done  . Hx of colonic polyps   . Hypertension   . Irritable bowel syndrome   . Osteopenia   . Osteoporosis   . Reflux esophagitis   . Tobacco use disorder   . Unspecified chronic bronchitis ([place])   . Vertigo   . Vitamin B12 deficiency   . Wears glasses     Patient Active Problem List   Diagnosis Date Noted  . Cough 06/10/2017  . Diarrhea 04/22/2016  . Family history of colon cancer - brother and father 03/10/2016  . Insomnia 02/11/2016  .  Generalized anxiety disorder 11/25/2015  . Urinary frequency 04/25/2015  . Nausea and vomiting in adult 11/08/2014  . Cigarette smoker 08/15/2013  . Otitis media 10/27/2012  . Abdominal pain, generalized 03/30/2012  . HEPATIC CYST 02/18/2010  . Chronic pain syndrome 02/08/2010  . Vitamin B12 deficiency 10/04/2009  . GERD 01/04/2009  . ABDOMINAL PAIN-EPIGASTRIC 01/04/2009  . DYSTHYMIA 08/10/2008  . VENOUS INSUFFICIENCY 08/10/2008  . Irritable bowel syndrome 08/10/2008  . Back pain 08/10/2008  . CIGARETTE SMOKER 03/06/2008  . Hx of adenomatous polyp of colon 12/06/2007  . BRONCHITIS, RECURRENT 12/06/2007  . Reflux esophagitis 12/06/2007  . DIVERTICULOSIS OF COLON 12/06/2007  . OSTEOPENIA 12/06/2007  . CALCULUS, KIDNEY 10/07/2007  . VERTIGO 10/07/2007  . Headache(784.0) 10/07/2007    Past Surgical History:  Procedure Laterality Date  . APPENDECTOMY    . BACK SURGERY  10-09   Dr. Patrice Paradise  . CARPAL TUNNEL RELEASE Right 08/14/2014   Procedure: RIGHT CARPAL TUNNEL RELEASE AND INJECT LEFT THUMB;  Surgeon: Daryll Brod, MD;  Location: [facility];  Service: Orthopedics;  Laterality: Right;  . COLONOSCOPY    . LAPAROSCOPY N/A 02/21/2013   Procedure: LAPAROSCOPY OPERATIVE;  Surgeon: Margarette Asal, MD;  Location: [facility];  Service: Gynecology;  Laterality: N/A;  REQUESTING 5MM SCOPE WITH CAMERA  . TONSILLECTOMY    .  TUBAL LIGATION    . UPPER GASTROINTESTINAL ENDOSCOPY      OB History    No data available       Home Medications    Prior to Admission medications   Medication Sig Start Date End Date Taking? Authorizing Provider  ALPRAZolam Duanne Moron) 0.5 MG tablet TAKE 1/2 TO 1 TABLET THREE TIMES DAILY AS NEEDED. 07/07/17   Golden Circle, FNP  ARIPiprazole (ABILIFY) 2 MG tablet Take 1 tablet (2 mg total) by mouth daily. 03/09/17   Golden Circle, FNP  cyanocobalamin (,VITAMIN B-12,) 1000 MCG/ML injection Inject 1,000 mcg into the muscle every 30 (thirty) days.     [provider]  diphenoxylate-atropine (LOMOTIL) 2.5-0.025 MG tablet Take 1 tablet by mouth 4 (four) times daily as needed for diarrhea or loose stools (may take 2). 03/10/16   Gatha Mayer, MD  Doxylamine Succinate, Sleep, (UNISOM) 25 MG tablet Take 25 mg by mouth at bedtime.     [provider]  DULoxetine (CYMBALTA) 60 MG capsule TAKE 1 CAPSULE DAILY. 06/08/17   Golden Circle, FNP  hyoscyamine (LEVSIN SL) 0.125 MG SL tablet Place 1 tablet (0.125 mg total) under the tongue every 4 (four) hours as needed for cramping. 03/09/17   Golden Circle, FNP  morphine (MS CONTIN) 15 MG 12 hr tablet Take 15 mg by mouth every 12 (twelve) hours. Take with 60 mg to make 75 mg    [provider]  morphine (MS CONTIN) 60 MG 12 hr tablet Take 60 mg by mouth every 12 (twelve) hours. Take with 15 mg to make 75 mg    [provider]  ondansetron (ZOFRAN) 4 MG tablet TAKE (1) TABLET EVERY SIX HOURS AS NEEDED FOR NAUSEA. 07/07/17   Golden Circle, FNP  pantoprazole (PROTONIX) 40 MG tablet TAKE 1 TABLET 30 MINUTES BEFORE BREAKFAST 03/09/17   Golden Circle, FNP  promethazine (PHENERGAN) 25 MG suppository Insert unwrapped suppository every 8-12 hours 09/02/16   Gatha Mayer, MD  zoledronic acid (RECLAST) 5 MG/100ML SOLN Inject 5 mg into the vein as directed. Once a year...    [provider]    Family History Family History  Problem Relation Age of Onset  . Colon cancer Father   . Prostate cancer Father   . Heart disease Mother        prev MVR, also has DJD  . Colon cancer Brother   . Skin cancer Sister   . Esophageal cancer Neg Hx   . Rectal cancer Neg Hx   . Stomach cancer Neg Hx     Social History Social History  Substance Use Topics  . Smoking status: Current Every Day Smoker    Packs/day: 1.00    Years: 30.00    Types: Cigarettes  . Smokeless tobacco: Never Used  . Alcohol use 0.0 oz/week     Comment: occasional     Allergies     Atorvastatin; Levofloxacin; Omnicef [cefdinir]; Trazodone and nefazodone; and Sulfonamide derivatives   Review of Systems Review of Systems All other systems negative except as documented in the HPI. All pertinent positives and negatives as reviewed in the HPI.  Physical Exam Updated Vital Signs BP (!) 143/75 (BP Location: Right Arm)   Pulse 83   Temp 97.7 F (36.5 C) (Oral)   Resp 16   Ht 5\' 3"  (1.6 m)   Wt 52.2 kg (115 lb)   SpO2 95%   BMI 20.37 kg/m   Physical Exam  Constitutional: She is oriented to person, place, and time. She appears well-developed and well-nourished. No distress.  HENT:  Head: Normocephalic and atraumatic.  Mouth/Throat: Oropharynx is clear and moist.  Eyes: Pupils are equal, round, and reactive to light.  Neck: Normal range of motion. Neck supple.  Cardiovascular: Normal rate, regular rhythm and normal heart sounds.  Exam reveals no gallop and no friction rub.   No murmur heard. Pulmonary/Chest: Effort normal and breath sounds normal. No respiratory distress. She has no wheezes.  Abdominal: Soft. Bowel sounds are normal. She exhibits no distension and no mass. There is tenderness. There is no rebound and no guarding.    Neurological: She is alert and oriented to person, place, and time. She exhibits normal muscle tone. Coordination normal.  Skin: Skin is warm and dry. Capillary refill takes less than 2 seconds. No rash noted. No erythema.  Psychiatric: She has a normal mood and affect. Her behavior is normal.  Nursing note and vitals reviewed.    ED Treatments / Results  Labs (all labs ordered are listed, but only abnormal results are displayed) Labs Reviewed  COMPREHENSIVE METABOLIC PANEL - Abnormal; Notable for the following:       Result Value   Chloride 95 (*)    Glucose, Bld 116 (*)    BUN <5 (*)    Calcium 8.8 (*)    All other components within normal limits  CBC - Abnormal; Notable for the following:    RBC 5.40 (*)    Hemoglobin  19.6 (*)    HCT 53.6 (*)    MCH 36.3 (*)    MCHC 36.6 (*)    All other components within normal limits  URINALYSIS, ROUTINE W REFLEX MICROSCOPIC - Abnormal; Notable for the following:    Hgb urine dipstick SMALL (*)    Squamous Epithelial / LPF 6-30 (*)    All other components within normal limits  LIPASE, BLOOD    EKG  EKG Interpretation None       Radiology Ct Abdomen Pelvis W Contrast  Result Date: 07/17/2017 CLINICAL DATA:  Vomiting 4 days ago with 1 episode of diarrhea 3 days ago. Abdominal cramping. EXAM: CT ABDOMEN AND PELVIS WITH CONTRAST TECHNIQUE: Multidetector CT imaging of the abdomen and pelvis was performed using the standard protocol following bolus administration of intravenous contrast. CONTRAST:  < 100 mL > ISOVUE-300 IOPAMIDOL (ISOVUE-300) INJECTION 61% COMPARISON:  07/03/2016 and 02/24/2013 FINDINGS: Lower chest: Lung bases are within normal. Couple tiny subcentimeter right pericardiophrenic lymph nodes unchanged. Hepatobiliary: Subcentimeter hypodensity over the left lobe of the liver without significant change likely a cyst but too small to characterize. Gallbladder is within normal. Mild stable prominence of the common bowel duct measuring 8 mm. Pancreas: Within normal. Spleen: Within normal. Adrenals/Urinary Tract: Adrenal glands are normal. Kidneys are normal in size without hydronephrosis or nephrolithiasis. There are a few small subcentimeter bilateral renal cortical hypodensities too small to characterize but likely cysts without significant change. Bladder and ureters are within normal. Stomach/Bowel: Stomach and small bowel are unremarkable. Previous appendectomy. Colon is within normal. Vascular/Lymphatic: Moderate calcified plaque over the abdominal aorta and iliac arteries. No significant adenopathy. Reproductive: Within normal. Other: No free fluid or focal inflammatory change visualized. Musculoskeletal: Degenerative changes of the spine with significant disc  space narrowing at the L3-4 level. Posterior fusion hardware intact at the L4-S1 level. IMPRESSION: No acute findings in the abdomen/pelvis. Bilateral subcentimeter renal cortical hypodensities unchanged and too small to characterize but likely cysts. Subcentimeter  hypodensity over the left lobe of the liver unchanged too small to characterize but likely a cyst. Aortic Atherosclerosis (ICD10-I70.0). Electronically Signed   By: Marin Olp M.D.   On: 07/17/2017 15:07    Procedures Procedures (including critical care time)  Medications Ordered in ED Medications  sodium chloride 0.9 % bolus 1,000 mL (1,000 mLs Intravenous New Bag/Given 07/17/17 1458)  ondansetron (ZOFRAN) injection 4 mg (4 mg Intravenous Given 07/17/17 1458)  iopamidol (ISOVUE-300) 61 % injection (100 mLs Intravenous Contrast Given 07/17/17 1441)  morphine 4 MG/ML injection 4 mg (4 mg Intravenous Given 07/17/17 1506)     Initial Impression / Assessment and Plan / ED Course  I have reviewed the triage vital signs and the nursing notes.  Pertinent labs & imaging results that were available during my care of the patient were reviewed by me and considered in my medical decision making (see chart for details).   patient be treated for gastroenteritis.  Told to return here as needed.  Patient agrees the plan and all questions were answered.  This is based off of her physical exam findings, along with her laboratory and imaging studies  Final Clinical Impressions(s) / ED Diagnoses   Final diagnoses:  None    New Prescriptions New Prescriptions   No medications on file     Rebeca Allegra 07/17/17 [place]    Tanna Furry, MD 07/18/17 [phone]

## 2017-07-21 DIAGNOSIS — G894 Chronic pain syndrome: Secondary | ICD-10-CM | POA: Diagnosis not present

## 2017-07-21 DIAGNOSIS — M961 Postlaminectomy syndrome, not elsewhere classified: Secondary | ICD-10-CM | POA: Diagnosis not present

## 2017-07-21 DIAGNOSIS — Z79891 Long term (current) use of opiate analgesic: Secondary | ICD-10-CM | POA: Diagnosis not present

## 2017-07-21 DIAGNOSIS — M150 Primary generalized (osteo)arthritis: Secondary | ICD-10-CM | POA: Diagnosis not present

## 2017-08-18 DIAGNOSIS — M150 Primary generalized (osteo)arthritis: Secondary | ICD-10-CM | POA: Diagnosis not present

## 2017-08-18 DIAGNOSIS — M961 Postlaminectomy syndrome, not elsewhere classified: Secondary | ICD-10-CM | POA: Diagnosis not present

## 2017-08-18 DIAGNOSIS — G894 Chronic pain syndrome: Secondary | ICD-10-CM | POA: Diagnosis not present

## 2017-08-18 DIAGNOSIS — Z79891 Long term (current) use of opiate analgesic: Secondary | ICD-10-CM | POA: Diagnosis not present

## 2017-08-19 ENCOUNTER — Ambulatory Visit
Admission: RE | Admit: 2017-08-19 | Discharge: 2017-08-19 | Disposition: A | Discharge status: Home / Self Care | Payer: Medicare HMO | Source: Ambulatory Visit | Attending: Anesthesiology | Admitting: Anesthesiology

## 2017-08-19 ENCOUNTER — Other Ambulatory Visit: Payer: Self-pay | Admitting: Anesthesiology

## 2017-08-19 DIAGNOSIS — M47816 Spondylosis without myelopathy or radiculopathy, lumbar region: Secondary | ICD-10-CM | POA: Diagnosis not present

## 2017-08-19 DIAGNOSIS — M545 Low back pain, unspecified: Secondary | ICD-10-CM

## 2017-08-26 DIAGNOSIS — G894 Chronic pain syndrome: Secondary | ICD-10-CM | POA: Diagnosis not present

## 2017-08-26 DIAGNOSIS — M150 Primary generalized (osteo)arthritis: Secondary | ICD-10-CM | POA: Diagnosis not present

## 2017-08-26 DIAGNOSIS — M961 Postlaminectomy syndrome, not elsewhere classified: Secondary | ICD-10-CM | POA: Diagnosis not present

## 2017-08-26 DIAGNOSIS — Z79891 Long term (current) use of opiate analgesic: Secondary | ICD-10-CM | POA: Diagnosis not present

## 2017-09-09 ENCOUNTER — Other Ambulatory Visit: Payer: Self-pay | Admitting: Family

## 2017-09-09 DIAGNOSIS — M4326 Fusion of spine, lumbar region: Secondary | ICD-10-CM | POA: Diagnosis not present

## 2017-09-09 DIAGNOSIS — G894 Chronic pain syndrome: Secondary | ICD-10-CM | POA: Diagnosis not present

## 2017-09-09 DIAGNOSIS — M960 Pseudarthrosis after fusion or arthrodesis: Secondary | ICD-10-CM | POA: Diagnosis not present

## 2017-09-24 DIAGNOSIS — M961 Postlaminectomy syndrome, not elsewhere classified: Secondary | ICD-10-CM | POA: Diagnosis not present

## 2017-09-24 DIAGNOSIS — G894 Chronic pain syndrome: Secondary | ICD-10-CM | POA: Diagnosis not present

## 2017-09-24 DIAGNOSIS — M150 Primary generalized (osteo)arthritis: Secondary | ICD-10-CM | POA: Diagnosis not present

## 2017-09-24 DIAGNOSIS — Z79891 Long term (current) use of opiate analgesic: Secondary | ICD-10-CM | POA: Diagnosis not present

## 2017-10-07 DIAGNOSIS — G894 Chronic pain syndrome: Secondary | ICD-10-CM | POA: Diagnosis not present

## 2017-10-07 DIAGNOSIS — M961 Postlaminectomy syndrome, not elsewhere classified: Secondary | ICD-10-CM | POA: Diagnosis not present

## 2017-10-07 DIAGNOSIS — M150 Primary generalized (osteo)arthritis: Secondary | ICD-10-CM | POA: Diagnosis not present

## 2017-10-07 DIAGNOSIS — Z79891 Long term (current) use of opiate analgesic: Secondary | ICD-10-CM | POA: Diagnosis not present

## 2017-10-14 DIAGNOSIS — S335XXA Sprain of ligaments of lumbar spine, initial encounter: Secondary | ICD-10-CM | POA: Diagnosis not present

## 2017-10-18 ENCOUNTER — Other Ambulatory Visit: Payer: Self-pay | Admitting: Family

## 2017-10-18 ENCOUNTER — Telehealth: Payer: Self-pay | Admitting: Family

## 2017-10-18 NOTE — Telephone Encounter (Signed)
ALPRAZolam (XANAX) 0.5 MG tablet   Patient is requesting a refill on this medication. She has a est care appointment with [facility] on Dec 10. She has been informed she may not be able to get a refill until she is seen. That was why we sent the letters out to get est care with a pcp.

## 2017-10-21 NOTE — Telephone Encounter (Signed)
No refills of medication will be made until discussed at [facility]. Sent my chart message letting pt know.

## 2017-10-22 ENCOUNTER — Ambulatory Visit (INDEPENDENT_AMBULATORY_CARE_PROVIDER_SITE_OTHER): Payer: Medicare HMO

## 2017-10-22 DIAGNOSIS — Z23 Encounter for immunization: Secondary | ICD-10-CM

## 2017-10-22 NOTE — Telephone Encounter (Signed)
Pt called stating that she is needing this refill. She said that she has been off of it for a week now and feels bad without it. Her appointment is not until December 11th. Any advise on what she should do?

## 2017-10-27 DIAGNOSIS — F4542 Pain disorder with related psychological factors: Secondary | ICD-10-CM | POA: Diagnosis not present

## 2017-10-27 DIAGNOSIS — Z981 Arthrodesis status: Secondary | ICD-10-CM | POA: Diagnosis not present

## 2017-10-27 DIAGNOSIS — F411 Generalized anxiety disorder: Secondary | ICD-10-CM | POA: Diagnosis not present

## 2017-10-27 DIAGNOSIS — M5136 Other intervertebral disc degeneration, lumbar region: Secondary | ICD-10-CM | POA: Diagnosis not present

## 2017-10-27 DIAGNOSIS — F332 Major depressive disorder, recurrent severe without psychotic features: Secondary | ICD-10-CM | POA: Diagnosis not present

## 2017-10-29 ENCOUNTER — Emergency Department (HOSPITAL_COMMUNITY)
Admission: EM | Admit: 2017-10-29 | Discharge: 2017-10-30 | Disposition: A | Discharge status: Home / Self Care | Payer: Medicare HMO | Attending: Emergency Medicine | Admitting: Emergency Medicine

## 2017-10-29 ENCOUNTER — Encounter (HOSPITAL_COMMUNITY): Payer: Self-pay | Admitting: Emergency Medicine

## 2017-10-29 ENCOUNTER — Emergency Department (HOSPITAL_COMMUNITY): Payer: Medicare HMO

## 2017-10-29 DIAGNOSIS — W0110XA Fall on same level from slipping, tripping and stumbling with subsequent striking against unspecified object, initial encounter: Secondary | ICD-10-CM | POA: Diagnosis not present

## 2017-10-29 DIAGNOSIS — Y999 Unspecified external cause status: Secondary | ICD-10-CM | POA: Diagnosis not present

## 2017-10-29 DIAGNOSIS — F17210 Nicotine dependence, cigarettes, uncomplicated: Secondary | ICD-10-CM | POA: Diagnosis not present

## 2017-10-29 DIAGNOSIS — S0990XA Unspecified injury of head, initial encounter: Secondary | ICD-10-CM | POA: Diagnosis not present

## 2017-10-29 DIAGNOSIS — Y9301 Activity, walking, marching and hiking: Secondary | ICD-10-CM | POA: Diagnosis not present

## 2017-10-29 DIAGNOSIS — S060X0A Concussion without loss of consciousness, initial encounter: Secondary | ICD-10-CM | POA: Diagnosis not present

## 2017-10-29 DIAGNOSIS — Y929 Unspecified place or not applicable: Secondary | ICD-10-CM | POA: Diagnosis not present

## 2017-10-29 DIAGNOSIS — Z79899 Other long term (current) drug therapy: Secondary | ICD-10-CM | POA: Insufficient documentation

## 2017-10-29 DIAGNOSIS — I10 Essential (primary) hypertension: Secondary | ICD-10-CM | POA: Diagnosis not present

## 2017-10-29 DIAGNOSIS — R69 Illness, unspecified: Secondary | ICD-10-CM | POA: Diagnosis not present

## 2017-10-29 DIAGNOSIS — R51 Headache: Secondary | ICD-10-CM | POA: Diagnosis present

## 2017-10-29 MED ORDER — SODIUM CHLORIDE 0.9 % IV BOLUS (SEPSIS)
500.0000 mL | Freq: Once | INTRAVENOUS | Status: AC
  Administered 2017-10-29: 500 mL via INTRAVENOUS

## 2017-10-29 MED ORDER — SODIUM CHLORIDE 0.9 % IV BOLUS (SEPSIS): 500.0000 mL | Freq: Once | INTRAVENOUS | Status: DC

## 2017-10-29 MED ORDER — OXYCODONE-ACETAMINOPHEN 5-325 MG PO TABS
1.0000 | ORAL_TABLET | Freq: Once | ORAL | Status: AC
  Administered 2017-10-29: 1 via ORAL
  Filled 2017-10-29: qty 1

## 2017-10-29 MED ORDER — METOCLOPRAMIDE HCL 5 MG/ML IJ SOLN
10.0000 mg | Freq: Once | INTRAMUSCULAR | Status: AC
  Administered 2017-10-29: 10 mg via INTRAVENOUS
  Filled 2017-10-29: qty 2

## 2017-10-29 NOTE — ED Notes (Signed)
Pt vomited immediately after drinking water with Oxycodone

## 2017-10-29 NOTE — ED Triage Notes (Signed)
Patient reports falling last night and hit her head. Worsening headache and nausea all day. Fall was from tripping over dog, no LOC or blood thinners. Alert and orientated x 4.

## 2017-10-29 NOTE — ED Provider Notes (Signed)
[facility] DEPT Provider Note   CSN: [id number] Arrival date & time: 10/29/17  1916     History   Chief Complaint Chief Complaint  Patient presents with  . Fall  . Headache    HPI Denise King is a 66 y.o. female.  The history is provided by the patient and medical records. No language interpreter was used.   Denise King is a 66 y.o. female  with a PMH of HTN, chronic pain, [name], vertigo who presents to the Emergency Department complaining of progressively worsening headache since yesterday.  Patient states that she fell last night and hit the right side of her head.  She states that she got up in the middle of the night to use the restroom when she tripped over her dog.  She did not lose consciousness.  This morning, she awoke with a right-sided headache.  It has progressively worsened throughout the day.  She has had associated nausea, but no emesis, however after evaluation in the ER, she did have one episode of emesis here.  Denies visual changes, slurred speech, difficulty ambulating or muscle weakness.  She is not on anticoagulation.   Past Medical History:  Diagnosis Date  . Acute cystitis   . Allergy    as a child grew out of them  . Anemia    pernicious anemia  . Anxiety   . Arthritis   . Back pain   . Blood transfusion   . Chronic pain syndrome   . Depression   . Diverticulosis of colon   . Dysthymia   . Gastritis   . GERD (gastroesophageal reflux disease)   . H/O chest pain Dec. 2013   no work up done  . Hx of colonic polyps   . Hypertension   . Irritable bowel syndrome   . Osteopenia   . Osteoporosis   . Reflux esophagitis   . Tobacco use disorder   . Unspecified chronic bronchitis ([place])   . Vertigo   . Vitamin B12 deficiency   . Wears glasses     Patient Active Problem List   Diagnosis Date Noted  . Nausea & vomiting 07/17/2017  . Cough 06/10/2017  . Diarrhea 04/22/2016  . Family history of colon  cancer - brother and father 03/10/2016  . Insomnia 02/11/2016  . Generalized anxiety disorder 11/25/2015  . Urinary frequency 04/25/2015  . Nausea and vomiting in adult 11/08/2014  . Cigarette smoker 08/15/2013  . Otitis media 10/27/2012  . Abdominal pain, generalized 03/30/2012  . HEPATIC CYST 02/18/2010  . Chronic pain syndrome 02/08/2010  . Vitamin B12 deficiency 10/04/2009  . GERD 01/04/2009  . ABDOMINAL PAIN-EPIGASTRIC 01/04/2009  . DYSTHYMIA 08/10/2008  . VENOUS INSUFFICIENCY 08/10/2008  . Irritable bowel syndrome 08/10/2008  . Back pain 08/10/2008  . CIGARETTE SMOKER 03/06/2008  . Hx of adenomatous polyp of colon 12/06/2007  . BRONCHITIS, RECURRENT 12/06/2007  . Reflux esophagitis 12/06/2007  . DIVERTICULOSIS OF COLON 12/06/2007  . OSTEOPENIA 12/06/2007  . CALCULUS, KIDNEY 10/07/2007  . VERTIGO 10/07/2007  . Headache(784.0) 10/07/2007    Past Surgical History:  Procedure Laterality Date  . APPENDECTOMY    . BACK SURGERY  10-09   Dr. Patrice Paradise  . CARPAL TUNNEL RELEASE Right 08/14/2014   Procedure: RIGHT CARPAL TUNNEL RELEASE AND INJECT LEFT THUMB;  Surgeon: Daryll Brod, MD;  Location: [facility];  Service: Orthopedics;  Laterality: Right;  . COLONOSCOPY    . LAPAROSCOPY N/A 02/21/2013   Procedure:  LAPAROSCOPY OPERATIVE;  Surgeon: Margarette Asal, MD;  Location: [facility];  Service: Gynecology;  Laterality: N/A;  REQUESTING 5MM SCOPE WITH CAMERA  . TONSILLECTOMY    . TUBAL LIGATION    . UPPER GASTROINTESTINAL ENDOSCOPY      OB History    No data available       Home Medications    Prior to Admission medications   Medication Sig Start Date End Date Taking? Authorizing Provider  Doxylamine Succinate, Sleep, (UNISOM) 25 MG tablet Take 25 mg by mouth at bedtime.    Yes [provider]  DULoxetine (CYMBALTA) 30 MG capsule Take 90 mg by mouth daily. 10/07/17  Yes [provider]  fentaNYL (DURAGESIC - DOSED MCG/HR) 50 MCG/HR Place 50 mcg  onto the skin every 3 (three) days. 10/14/17  Yes [provider]  gabapentin (NEURONTIN) 100 MG capsule Take 200 mg by mouth at bedtime. 10/13/17  Yes [provider]  hyoscyamine (LEVSIN SL) 0.125 MG SL tablet Place 1 tablet (0.125 mg total) under the tongue every 4 (four) hours as needed for cramping. 03/09/17  Yes Golden Circle, FNP  ondansetron (ZOFRAN) 4 MG tablet TAKE (1) TABLET EVERY SIX HOURS AS NEEDED FOR NAUSEA. Patient taking differently: TAKE 4MG  BY MOUTH EVERY SIX HOURS AS NEEDED FOR NAUSEA. 07/07/17  Yes Golden Circle, FNP  Oxycodone HCl 10 MG TABS Take 10 mg by mouth every 6 (six) hours as needed for pain. 10/21/17  Yes [provider]  pantoprazole (PROTONIX) 40 MG tablet TAKE 1 TABLET 30 MINUTES BEFORE BREAKFAST Patient taking differently: Take 40 mg by mouth daily.  03/09/17  Yes Golden Circle, FNP  zoledronic acid (RECLAST) 5 MG/100ML SOLN Inject 5 mg into the vein as directed. Once a year...   Yes [provider]  ALPRAZolam (XANAX) 0.5 MG tablet TAKE 1/2 TO 1 TABLET THREE TIMES DAILY AS NEEDED. Patient not taking: Reported on 10/29/2017 07/07/17   Golden Circle, FNP  ARIPiprazole (ABILIFY) 2 MG tablet Take 1 tablet (2 mg total) by mouth daily. Patient not taking: Reported on 10/29/2017 03/09/17   Golden Circle, FNP  DULoxetine (CYMBALTA) 60 MG capsule TAKE 1 CAPSULE DAILY. Patient not taking: Reported on 10/29/2017 09/10/17   Golden Circle, FNP  ondansetron (ZOFRAN ODT) 4 MG disintegrating tablet Take 1 tablet (4 mg total) by mouth every 8 (eight) hours as needed for nausea or vomiting. 10/30/17   Ermie Glendenning, Ozella Almond, PA-C  promethazine (PHENERGAN) 12.5 MG tablet Take 1 tablet (12.5 mg total) by mouth every 6 (six) hours as needed for nausea or vomiting. Patient not taking: Reported on 10/30/2017 07/17/17   Okey Regal, PA-C  promethazine (PHENERGAN) 25 MG suppository Insert unwrapped suppository every 8-12 hours Patient not  taking: Reported on 10/30/2017 09/02/16   Gatha Mayer, MD    Family History Family History  Problem Relation Age of Onset  . Colon cancer Father   . Prostate cancer Father   . Heart disease Mother        prev MVR, also has DJD  . Colon cancer Brother   . Skin cancer Sister   . Esophageal cancer Neg Hx   . Rectal cancer Neg Hx   . Stomach cancer Neg Hx     Social History Social History  Substance Use Topics  . Smoking status: Current Every Day Smoker    Packs/day: 1.00    Years: 30.00    Types: Cigarettes  . Smokeless tobacco: Never  Used  . Alcohol use 0.0 oz/week     Comment: occasional     Allergies   Atorvastatin; Levofloxacin; Omnicef [cefdinir]; Trazodone and nefazodone; and Sulfonamide derivatives   Review of Systems Review of Systems  Gastrointestinal: Positive for nausea and vomiting. Negative for abdominal pain.  Neurological: Positive for headaches. Negative for dizziness, syncope, speech difficulty, weakness and numbness.  All other systems reviewed and are negative.    Physical Exam Updated Vital Signs BP 133/77 (BP Location: Left Arm)   Pulse (!) 104   Temp 98 F (36.7 C) (Oral)   Resp 18   SpO2 (!) 87%   Physical Exam  Constitutional: She is oriented to person, place, and time. She appears well-developed and well-nourished. No distress.  HENT:  Head: Normocephalic and atraumatic.  Cardiovascular: Normal rate, regular rhythm and normal heart sounds.   No murmur heard. Pulmonary/Chest: Effort normal and breath sounds normal. No respiratory distress.  Abdominal: Soft. She exhibits no distension. There is no tenderness.  Musculoskeletal: She exhibits no edema.  Neurological: She is alert and oriented to person, place, and time.  Alert, oriented, thought content appropriate, able to give a coherent history. Speech is clear and goal oriented, able to follow commands.  Cranial Nerves:  II:  Peripheral visual fields grossly normal, pupils equal,  round, reactive to light III, IV, VI: EOM intact bilaterally, ptosis not present V,VII: smile symmetric, eyes kept closed tightly against resistance, facial light touch sensation equal VIII: hearing grossly normal IX, X: symmetric soft palate movement, uvula elevates symmetrically  XI: bilateral shoulder shrug symmetric and strong XII: midline tongue extension 5/5 muscle strength in upper and lower extremities bilaterally including strong and equal grip strength and dorsiflexion/plantar flexion Sensory to light touch normal in all four extremities.  Normal finger-to-nose and rapid alternating movements; normal gait and balance. No drift.  Skin: Skin is warm and dry.  Nursing note and vitals reviewed.    ED Treatments / Results  Labs (all labs ordered are listed, but only abnormal results are displayed) Labs Reviewed - No data to display  EKG  EKG Interpretation None       Radiology Ct Head Wo Contrast  Result Date: 10/30/2017 CLINICAL DATA:  Head trauma, minor, GCS>=13, high clinical risk, initial exam. Trip over dog and fall. EXAM: CT HEAD WITHOUT CONTRAST TECHNIQUE: Contiguous axial images were obtained from the base of the skull through the vertex without intravenous contrast. COMPARISON:  None. FINDINGS: Brain: Normal for age atrophy. Mild chronic small vessel ischemia. No intracranial hemorrhage, mass effect, or midline shift. No hydrocephalus. The basilar cisterns are patent. No evidence of territorial infarct or acute ischemia. No extra-axial or intracranial fluid collection. Vascular: Atherosclerosis of skullbase vasculature without hyperdense vessel or abnormal calcification. Skull: No fracture or focal lesion. Sinuses/Orbits: Paranasal sinuses and mastoid air cells are clear. The visualized orbits are unremarkable. Other: None. IMPRESSION: No acute intracranial abnormality. Electronically Signed   By: Jeb Levering M.D.   On: 10/30/2017 00:27    Procedures Procedures  (including critical care time)  Medications Ordered in ED Medications  metoCLOPramide (REGLAN) injection 10 mg (10 mg Intravenous Given 10/29/17 2327)  oxyCODONE-acetaminophen (PERCOCET/ROXICET) 5-325 MG per tablet 1 tablet (1 tablet Oral Given 10/29/17 2331)  sodium chloride 0.9 % bolus 500 mL (0 mLs Intravenous Stopped 10/30/17 0038)  ketorolac (TORADOL) 30 MG/ML injection 30 mg (30 mg Intravenous Given 10/30/17 0038)     Initial Impression / Assessment and Plan / ED Course  I have  reviewed the triage vital signs and the nursing notes.  Pertinent labs & imaging results that were available during my care of the patient were reviewed by me and considered in my medical decision making (see chart for details).    CORINNE GOUCHER is a 66 y.o. female who presents to ED for right-sided headache which is been progressively worsening since tripping over her dog and striking the right side of her head last night.  No focal neuro deficits on exam.  Having associated photophobia nausea and one episode of vomiting.  CT head with no acute findings.  Likely concussive symptoms.  Feels improved after symptomatic management in the ED.  She is now tolerating p.o. without difficulty.  Of note, last documented SPO2 of 87%.  I personally evaluated the patient after this who has clear lungs, speaking in full sentences, SpO2 of 93-96% throughout evaluation. Evaluation does not show pathology that would require ongoing emergent intervention or inpatient treatment.  Symptomatic home care instructions and   Head injury home care instructions discussed.  Reasons to return to ED discussed and all questions answered.     Final Clinical Impressions(s) / ED Diagnoses   Final diagnoses:  Injury of head, initial encounter  Concussion without loss of consciousness, initial encounter    New Prescriptions Discharge Medication List as of 10/30/2017  1:00 AM    START taking these medications   Details  ondansetron  (ZOFRAN ODT) 4 MG disintegrating tablet Take 1 tablet (4 mg total) by mouth every 8 (eight) hours as needed for nausea or vomiting., Starting Sat 10/30/2017, Print         Lars Jeziorski, Ozella Almond, PA-C 10/30/17 0119    Duffy Bruce, MD 10/31/17 0150

## 2017-10-30 DIAGNOSIS — Z79899 Other long term (current) drug therapy: Secondary | ICD-10-CM | POA: Diagnosis not present

## 2017-10-30 DIAGNOSIS — R69 Illness, unspecified: Secondary | ICD-10-CM | POA: Diagnosis not present

## 2017-10-30 DIAGNOSIS — W0110XA Fall on same level from slipping, tripping and stumbling with subsequent striking against unspecified object, initial encounter: Secondary | ICD-10-CM | POA: Diagnosis not present

## 2017-10-30 DIAGNOSIS — S060X0A Concussion without loss of consciousness, initial encounter: Secondary | ICD-10-CM | POA: Diagnosis not present

## 2017-10-30 DIAGNOSIS — I10 Essential (primary) hypertension: Secondary | ICD-10-CM | POA: Diagnosis not present

## 2017-10-30 DIAGNOSIS — S0990XA Unspecified injury of head, initial encounter: Secondary | ICD-10-CM | POA: Diagnosis not present

## 2017-10-30 MED ORDER — KETOROLAC TROMETHAMINE 30 MG/ML IJ SOLN
30.0000 mg | Freq: Once | INTRAMUSCULAR | Status: AC
  Administered 2017-10-30: 30 mg via INTRAVENOUS
  Filled 2017-10-30: qty 1

## 2017-10-30 MED ORDER — ONDANSETRON 4 MG PO TBDP: 4.0000 mg | ORAL_TABLET | Freq: Three times a day (TID) | ORAL | 0 refills | Status: DC | PRN

## 2017-10-30 NOTE — Discharge Instructions (Signed)
Ibuprofen or Tylenol for pain. Zofran as needed for nausea. Rest, ice on head.  Stay in a quiet, non-simulating, dark environment. No TV, computer use, screens until headache is resolved completely. Follow up with your primary care physician if headache persists.  Return to the emergency department for new or worsening symptoms, vomiting not controlled with medication or other change in mental status.

## 2017-11-04 DIAGNOSIS — M150 Primary generalized (osteo)arthritis: Secondary | ICD-10-CM | POA: Diagnosis not present

## 2017-11-04 DIAGNOSIS — G894 Chronic pain syndrome: Secondary | ICD-10-CM | POA: Diagnosis not present

## 2017-11-04 DIAGNOSIS — M961 Postlaminectomy syndrome, not elsewhere classified: Secondary | ICD-10-CM | POA: Diagnosis not present

## 2017-11-04 DIAGNOSIS — Z79891 Long term (current) use of opiate analgesic: Secondary | ICD-10-CM | POA: Diagnosis not present

## 2017-12-02 DIAGNOSIS — Z79891 Long term (current) use of opiate analgesic: Secondary | ICD-10-CM | POA: Diagnosis not present

## 2017-12-02 DIAGNOSIS — M961 Postlaminectomy syndrome, not elsewhere classified: Secondary | ICD-10-CM | POA: Diagnosis not present

## 2017-12-02 DIAGNOSIS — G894 Chronic pain syndrome: Secondary | ICD-10-CM | POA: Diagnosis not present

## 2017-12-02 DIAGNOSIS — M150 Primary generalized (osteo)arthritis: Secondary | ICD-10-CM | POA: Diagnosis not present

## 2017-12-07 ENCOUNTER — Ambulatory Visit: Payer: Medicare HMO | Admitting: Nurse Practitioner

## 2017-12-09 IMAGING — MR MR PELVIS W/O CM
4 of 5 series · 29 of 48 positions shown · non-contrast
Comparison: CT abdomen and pelvis 02/22/2013.

CLINICAL DATA: Severe buttock pain for 12 days. No known injury.
Initial encounter. History of prior lumbar surgery.

EXAM:
MRI PELVIS WITHOUT CONTRAST
TECHNIQUE: Multiplanar multisequence MR imaging of the pelvis was performed. No
intravenous contrast was administered.

[Series 4: T1 · axial · 6.0mm · 0.74mm/px · z∈[-73,+111]mm · 8 of 23 slices shown]
[im 1/23]
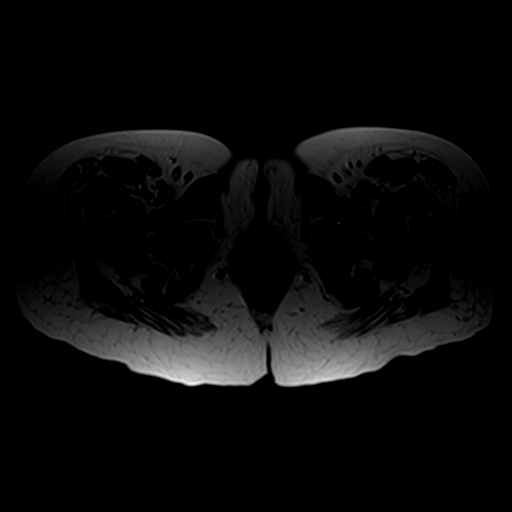
[im 4/23]
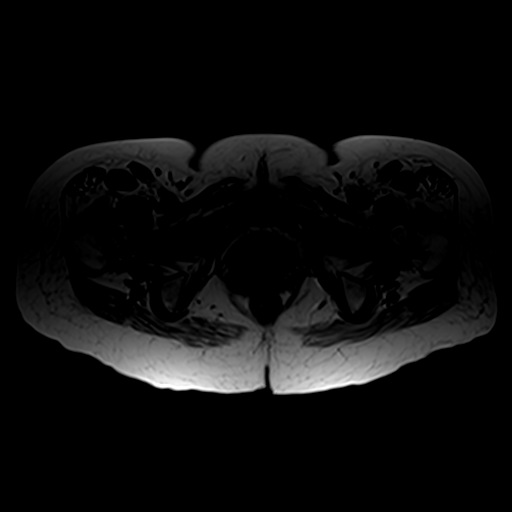
[im 7/23]
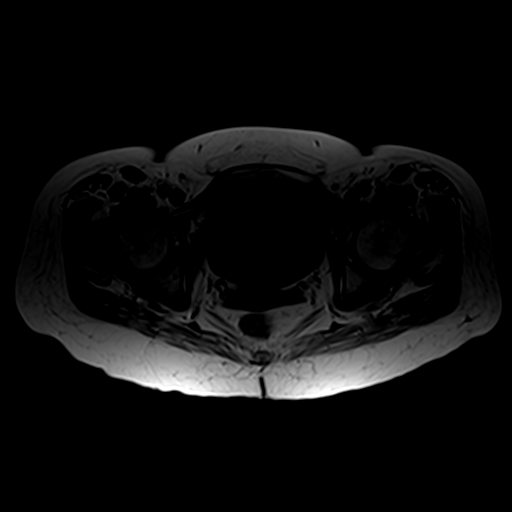
[im 10/23]
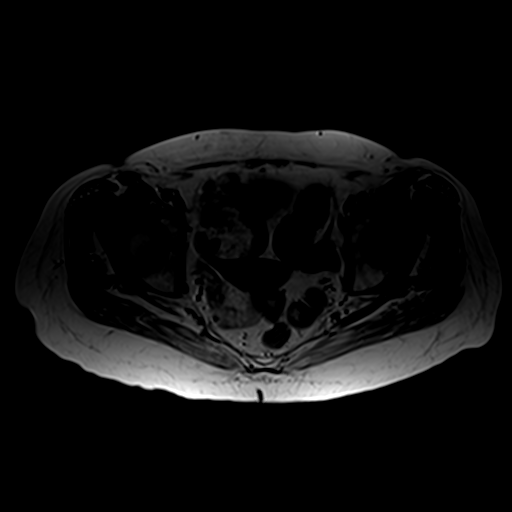
[im 13/23]
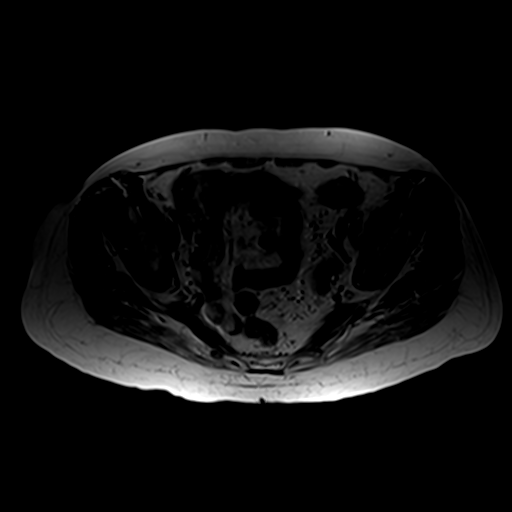
[im 16/23]
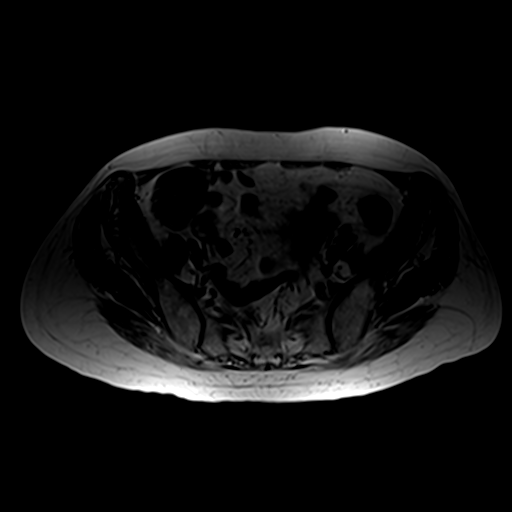
[im 19/23]
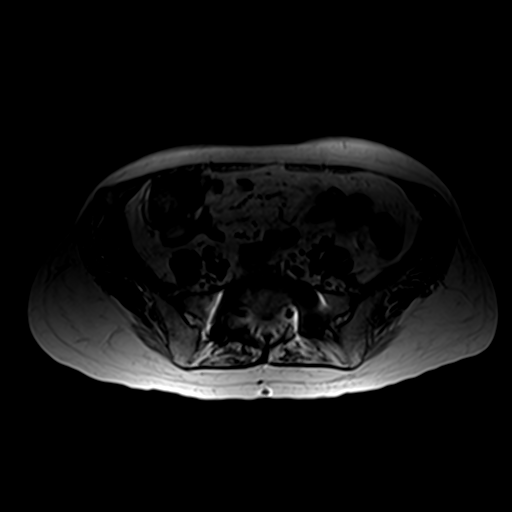
[im 23/23]
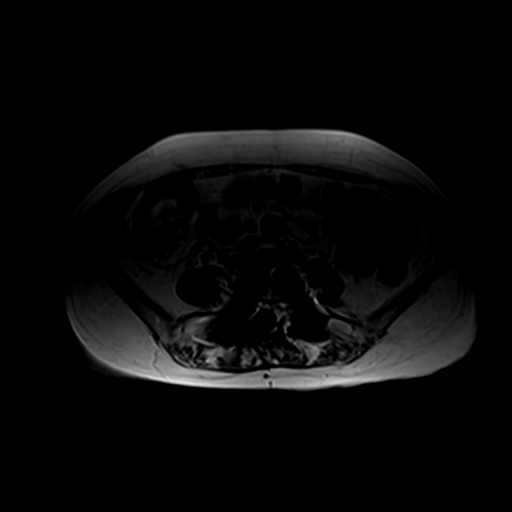

[Series 5: T2 fat-sat · axial · 6.0mm · 0.74mm/px · z∈[-73,+111]mm · 8 of 23 slices shown (1 of 2)]
[im 1/23]
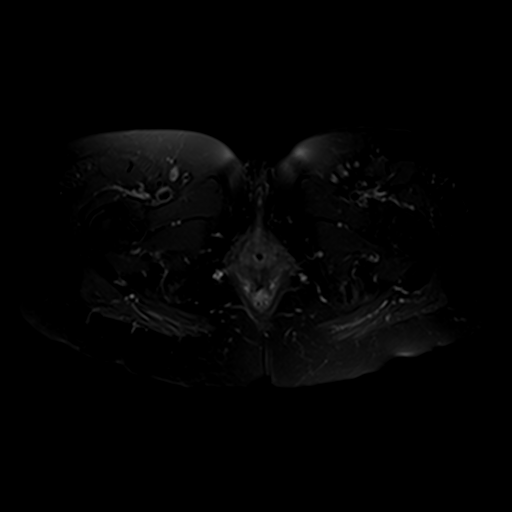
[im 4/23]
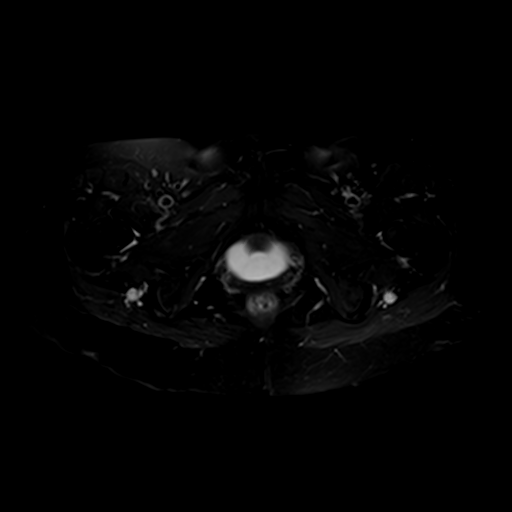
[im 7/23]
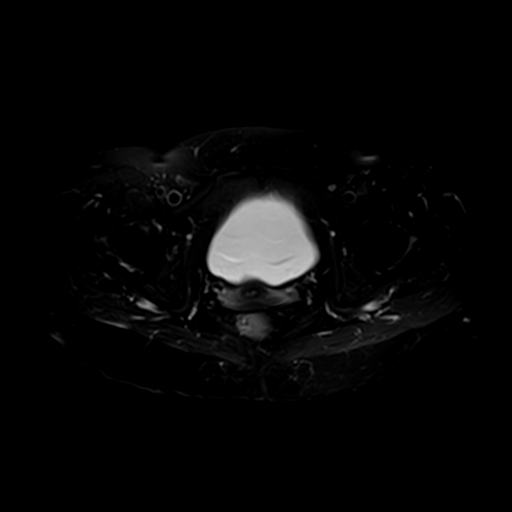
[im 10/23]
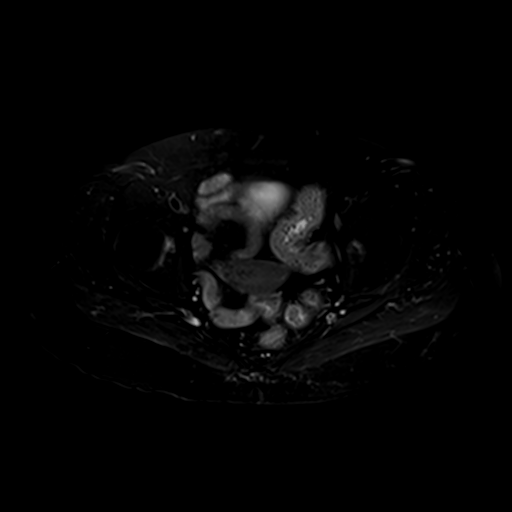
[im 13/23]
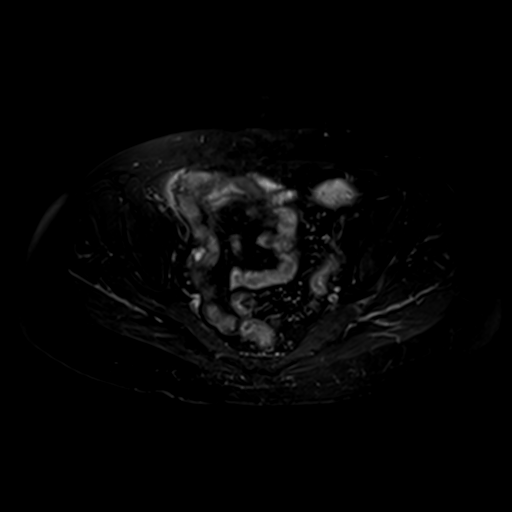
[im 16/23]
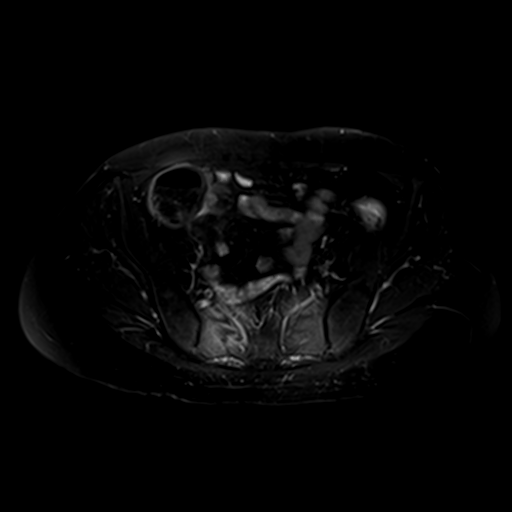
[im 19/23]
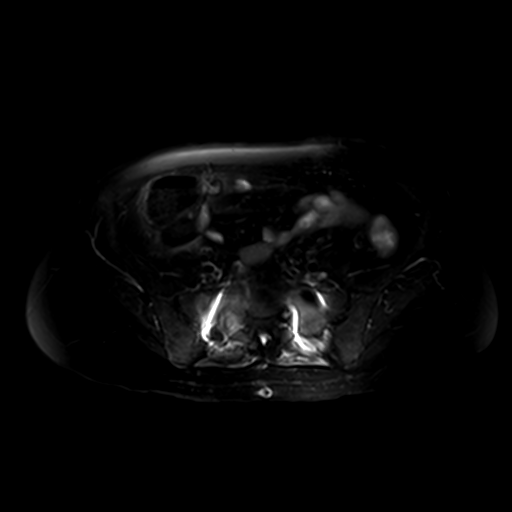
[im 23/23]
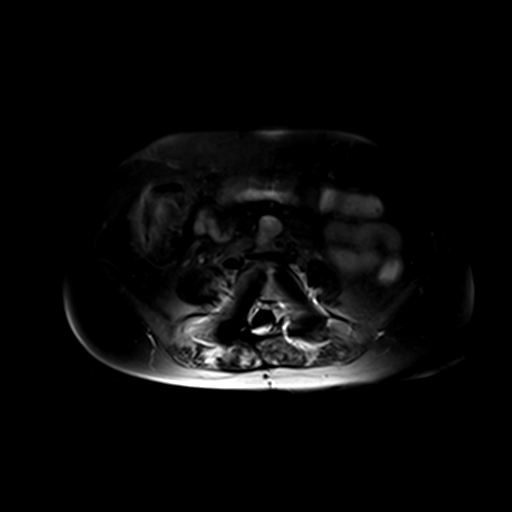

[Series 6: T2 fat-sat · sagittal · 6.0mm · 1.17mm/px · 8 of 38 slices shown (2 of 2)]
[im 1/38]
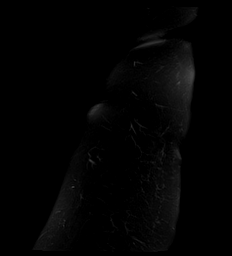
[im 6/38]
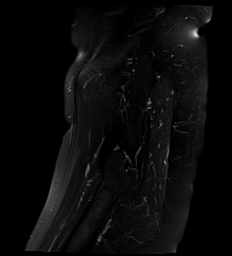
[im 12/38]
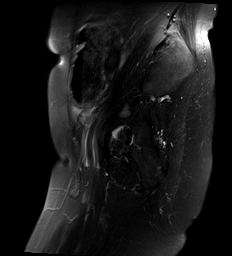
[im 18/38]
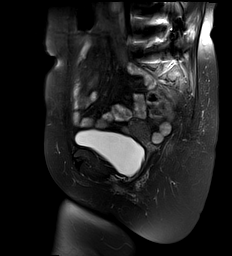
[im 20/38]
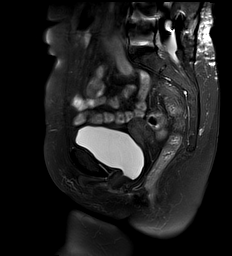
[im 26/38]
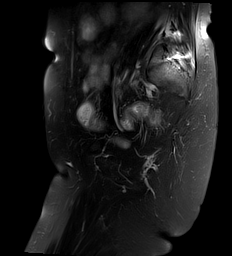
[im 32/38]
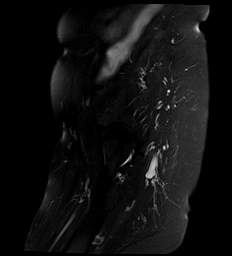
[im 38/38]
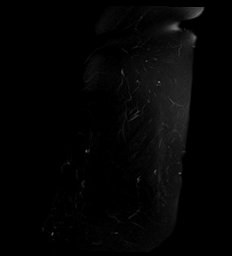

[Series 7: STIR · coronal · 5.0mm · 1.41mm/px · 5 of 24 slices shown]
[im 1/24]
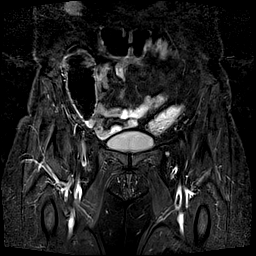
[im 3/24]
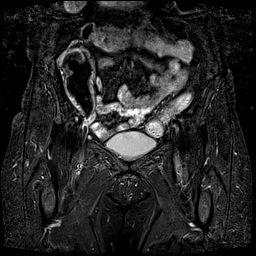
[im 6/24]
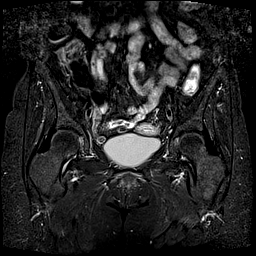
[im 12/24]
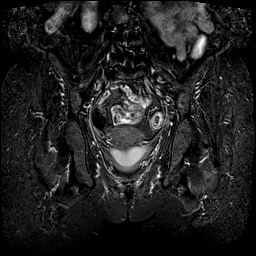
[im 21/24]
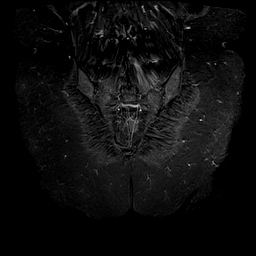

[29 of 48 positions shown; findings below may reference images not displayed]

FINDINGS: There is some artifact from fusion hardware from L4-S1. Bone marrow
signal is normal throughout without fracture or stress change. There
is no avascular necrosis of the femoral heads. The sacroiliac joints
and symphysis pubis appear normal. No hip joint effusion or evidence
of bursitis is identified. No mass is seen. All imaged pelvic
musculature is intact and normal in appearance. Imaged intrapelvic
contents are unremarkable.
IMPRESSION: Negative examination.  No finding to explain the patient's symptoms.

Status post L4-S1 fusion.

## 2017-12-15 DIAGNOSIS — R69 Illness, unspecified: Secondary | ICD-10-CM | POA: Diagnosis not present

## 2017-12-22 ENCOUNTER — Other Ambulatory Visit (INDEPENDENT_AMBULATORY_CARE_PROVIDER_SITE_OTHER): Payer: Medicare HMO

## 2017-12-22 ENCOUNTER — Encounter: Payer: Self-pay | Admitting: Internal Medicine

## 2017-12-22 ENCOUNTER — Ambulatory Visit (INDEPENDENT_AMBULATORY_CARE_PROVIDER_SITE_OTHER): Payer: Medicare HMO | Admitting: Internal Medicine

## 2017-12-22 VITALS — BP 122/88 | HR 92 | Temp 98.3°F | Ht 63.0 in | Wt 125.0 lb

## 2017-12-22 DIAGNOSIS — Z114 Encounter for screening for human immunodeficiency virus [HIV]: Secondary | ICD-10-CM

## 2017-12-22 DIAGNOSIS — F329 Major depressive disorder, single episode, unspecified: Secondary | ICD-10-CM

## 2017-12-22 DIAGNOSIS — F32A Depression, unspecified: Secondary | ICD-10-CM | POA: Insufficient documentation

## 2017-12-22 DIAGNOSIS — Z1159 Encounter for screening for other viral diseases: Secondary | ICD-10-CM

## 2017-12-22 DIAGNOSIS — E538 Deficiency of other specified B group vitamins: Secondary | ICD-10-CM

## 2017-12-22 DIAGNOSIS — R739 Hyperglycemia, unspecified: Secondary | ICD-10-CM | POA: Insufficient documentation

## 2017-12-22 DIAGNOSIS — K580 Irritable bowel syndrome with diarrhea: Secondary | ICD-10-CM

## 2017-12-22 DIAGNOSIS — D751 Secondary polycythemia: Secondary | ICD-10-CM

## 2017-12-22 DIAGNOSIS — Z0000 Encounter for general adult medical examination without abnormal findings: Secondary | ICD-10-CM | POA: Insufficient documentation

## 2017-12-22 DIAGNOSIS — E559 Vitamin D deficiency, unspecified: Secondary | ICD-10-CM | POA: Diagnosis not present

## 2017-12-22 DIAGNOSIS — F411 Generalized anxiety disorder: Secondary | ICD-10-CM

## 2017-12-22 DIAGNOSIS — R69 Illness, unspecified: Secondary | ICD-10-CM | POA: Diagnosis not present

## 2017-12-22 DIAGNOSIS — Z0001 Encounter for general adult medical examination with abnormal findings: Secondary | ICD-10-CM

## 2017-12-22 DIAGNOSIS — G894 Chronic pain syndrome: Secondary | ICD-10-CM | POA: Diagnosis not present

## 2017-12-22 DIAGNOSIS — M5431 Sciatica, right side: Secondary | ICD-10-CM

## 2017-12-22 DIAGNOSIS — F17200 Nicotine dependence, unspecified, uncomplicated: Secondary | ICD-10-CM

## 2017-12-22 HISTORY — DX: Sciatica, right side: M54.31

## 2017-12-22 LAB — CBC WITH DIFFERENTIAL/PLATELET
Basophils Absolute: 0.1 10*3/uL (ref 0.0–0.1)
Basophils Relative: 1.2 % (ref 0.0–3.0)
Eosinophils Absolute: 0 10*3/uL (ref 0.0–0.7)
Eosinophils Relative: 0.6 % (ref 0.0–5.0)
HCT: 39.6 % (ref 36.0–46.0)
Hemoglobin: 13.3 g/dL (ref 12.0–15.0)
Lymphocytes Relative: 41.2 % (ref 12.0–46.0)
Lymphs Abs: 2.9 10*3/uL (ref 0.7–4.0)
MCHC: 33.6 g/dL (ref 30.0–36.0)
MCV: 106 fl — ABNORMAL HIGH (ref 78.0–100.0)
Monocytes Absolute: 0.6 10*3/uL (ref 0.1–1.0)
Monocytes Relative: 7.8 % (ref 3.0–12.0)
Neutro Abs: 3.5 10*3/uL (ref 1.4–7.7)
Neutrophils Relative %: 49.2 % (ref 43.0–77.0)
Platelets: 325 10*3/uL (ref 150.0–400.0)
RBC: 3.74 Mil/uL — ABNORMAL LOW (ref 3.87–5.11)
RDW: 13.6 % (ref 11.5–15.5)
WBC: 7.1 10*3/uL (ref 4.0–10.5)

## 2017-12-22 LAB — IBC PANEL
Iron: 194 ug/dL — ABNORMAL HIGH (ref 42–145)
Saturation Ratios: 62.1 % — ABNORMAL HIGH (ref 20.0–50.0)
Transferrin: 223 mg/dL (ref 212.0–360.0)

## 2017-12-22 LAB — BASIC METABOLIC PANEL
BUN: 6 mg/dL (ref 6–23)
CO2: 30 mEq/L (ref 19–32)
Calcium: 8.8 mg/dL (ref 8.4–10.5)
Chloride: 99 mEq/L (ref 96–112)
Creatinine, Ser: 0.71 mg/dL (ref 0.40–1.20)
GFR: 87.92 mL/min (ref >60.00)
Glucose, Bld: 92 mg/dL (ref 70–99)
Potassium: 3.9 mEq/L (ref 3.5–5.1)
Sodium: 136 mEq/L (ref 135–145)

## 2017-12-22 LAB — HEPATIC FUNCTION PANEL
ALT: 10 U/L (ref 0–35)
AST: 21 U/L (ref 0–37)
Albumin: 4.1 g/dL (ref 3.5–5.2)
Alkaline Phosphatase: 82 U/L (ref 39–117)
Bilirubin, Direct: 0.1 mg/dL (ref 0.0–0.3)
Total Bilirubin: 0.6 mg/dL (ref 0.2–1.2)
Total Protein: 7.1 g/dL (ref 6.0–8.3)

## 2017-12-22 LAB — LIPID PANEL
Cholesterol: 159 mg/dL (ref 0–200)
HDL: 60 mg/dL (ref >39.00)
LDL Cholesterol: 78 mg/dL (ref 0–99)
NonHDL: 99
Total CHOL/HDL Ratio: 3
Triglycerides: 103 mg/dL (ref 0.0–149.0)
VLDL: 20.6 mg/dL (ref 0.0–40.0)

## 2017-12-22 LAB — HEMOGLOBIN A1C: Hgb A1c MFr Bld: 4.9 % (ref 4.6–6.5)

## 2017-12-22 MED ORDER — CLONAZEPAM 1 MG PO TABS: 0.5000 mg | ORAL_TABLET | Freq: Every evening | ORAL | 1 refills | Status: DC | PRN

## 2017-12-22 MED ORDER — HYOSCYAMINE SULFATE 0.125 MG SL SUBL: 0.1250 mg | SUBLINGUAL_TABLET | SUBLINGUAL | 3 refills | Status: DC | PRN

## 2017-12-22 NOTE — Assessment & Plan Note (Signed)
Mild uncontrolled, for klonopin prn,  to f/u any worsening symptoms or concerns

## 2017-12-22 NOTE — Assessment & Plan Note (Addendum)
Etiology unclear, for lab f/u, consider heme referral  In addition to the time spent performing CPE, I spent an additional 25 minutes face to face,in which greater than 50% of this time was spent in counseling and coordination of care for patient's acute illness as documented, including the differential dx, treatment, further evaluation and other management of polycythemia, depression, anxiety, tobacco dependence, chronic pain, hyperglycemia, B12 deficiency and [name]

## 2017-12-22 NOTE — Patient Instructions (Signed)
Please take all new medication as prescribed - the klonopin  Please continue all other medications as before, and refills have been done if requested.  Please have the pharmacy call with any other refills you may need.  Please continue your efforts at being more active, low cholesterol diet, and weight control.  You are otherwise up to date with prevention measures today.  Please keep your appointments with your specialists as you may have planned  Please go to the LAB in the Basement (turn left off the elevator) for the tests to be done today  You will be contacted by phone if any changes need to be made immediately.  Otherwise, you will receive a letter about your results with an explanation, but please check with MyChart first.  Please remember to sign up for MyChart if you have not done so, as this will be important to you in the future with finding out test results, communicating by private email, and scheduling acute appointments online when needed.  Please return in 6 months, or sooner if needed

## 2017-12-22 NOTE — Assessment & Plan Note (Signed)
Also for b12 lab f/u,  to f/u any worsening symptoms or concerns

## 2017-12-22 NOTE — Assessment & Plan Note (Signed)

## 2017-12-22 NOTE — Assessment & Plan Note (Signed)
Stable, for hyoscyamine prn refill,  to f/u any worsening symptoms or concerns

## 2017-12-22 NOTE — Progress Notes (Signed)
Subjective:    Patient ID: Denise King, female    DOB: 1951-02-23, 66 y.o.   MRN: [id number]  HPI  Here for wellness and f/u;  Overall doing ok;  Pt denies Chest pain, worsening SOB, DOE, wheezing, orthopnea, PND, worsening LE edema, palpitations, dizziness or syncope.  Pt denies neurological change such as new headache, facial or extremity weakness.  Pt denies polydipsia, polyuria, or low sugar symptoms. Pt states overall good compliance with treatment and medications, good tolerability, and has been trying to follow appropriate diet.  Pt denies worsening depressive symptoms, suicidal ideation or panic. No fever, night sweats, wt loss, loss of appetite, or other constitutional symptoms.  Pt states good ability with ADL's, has low fall risk, home safety reviewed and adequate, no other significant changes in hearing or vision, and only occasionally active with exercise.  Plans to see GYN for pap and mammogram. Also, pt was seen at ER a few months ago with dizziness and fall, CT head.  Has been eval for spinal stimulator for LBP but not cleared by psychiatry. Sees pain management, also now on cymbalta 90 mg.  Denies worsening depressive symptoms, suicidal ideation, or panic; has ongoing anxiety, and went through what sounds like xanax withdrawal. Daughter with bipolar, and wont see psychiatry, causes much stress in the family.   Stil smokes about 1 ppd, not ready to quit.  Had significant polycytemia in July 2018.   Past Medical History:  Diagnosis Date  . Acute cystitis   . Allergy    as a child grew out of them  . Anemia    pernicious anemia  . Anxiety   . Arthritis   . Back pain   . Blood transfusion   . Chronic pain syndrome   . Depression   . Diverticulosis of colon   . Dysthymia   . Gastritis   . GERD (gastroesophageal reflux disease)   . H/O chest pain Dec. 2013   no work up done  . Hx of colonic polyps   . Hypertension   . Irritable bowel syndrome   . Osteopenia   .  Osteoporosis   . Reflux esophagitis   . Right sided sciatica 12/22/2017  . Tobacco use disorder   . Unspecified chronic bronchitis ([place])   . Vertigo   . Vitamin B12 deficiency   . Wears glasses    Past Surgical History:  Procedure Laterality Date  . APPENDECTOMY    . BACK SURGERY  10-09   Dr. Patrice Paradise  . CARPAL TUNNEL RELEASE Right 08/14/2014   Procedure: RIGHT CARPAL TUNNEL RELEASE AND INJECT LEFT THUMB;  Surgeon: Daryll Brod, MD;  Location: [facility];  Service: Orthopedics;  Laterality: Right;  . COLONOSCOPY    . LAPAROSCOPY N/A 02/21/2013   Procedure: LAPAROSCOPY OPERATIVE;  Surgeon: Margarette Asal, MD;  Location: [facility];  Service: Gynecology;  Laterality: N/A;  REQUESTING 5MM SCOPE WITH CAMERA  . TONSILLECTOMY    . TUBAL LIGATION    . UPPER GASTROINTESTINAL ENDOSCOPY      reports that she has been smoking cigarettes.  She has a 30.00 pack-year smoking history. she has never used smokeless tobacco. She reports that she drinks alcohol. She reports that she does not use drugs. family history includes Colon cancer in her brother and father; Heart disease in her mother; Prostate cancer in her father; Skin cancer in her sister. Allergies  Allergen Reactions  . Atorvastatin Nausea And Vomiting  . Levofloxacin  Reaction: achilles tendon pain  . Omnicef [Cefdinir] Diarrhea    Uncontrollable diarrhea  . Trazodone And Nefazodone     "Felt like I was going to faint"  . Sulfonamide Derivatives Rash   Current Outpatient Medications on File Prior to Visit  Medication Sig Dispense Refill  . ARIPiprazole (ABILIFY) 2 MG tablet Take 1 tablet (2 mg total) by mouth daily. 30 tablet 1  . Doxylamine Succinate, Sleep, (UNISOM) 25 MG tablet Take 25 mg by mouth at bedtime.     . DULoxetine (CYMBALTA) 30 MG capsule Take 90 mg by mouth daily.    . fentaNYL (DURAGESIC - DOSED MCG/HR) 50 MCG/HR Place 50 mcg onto the skin every 3 (three) days. 1 patch ever 48 hrs    . gabapentin  (NEURONTIN) 100 MG capsule Take 200 mg by mouth at bedtime.    . ondansetron (ZOFRAN ODT) 4 MG disintegrating tablet Take 1 tablet (4 mg total) by mouth every 8 (eight) hours as needed for nausea or vomiting. 20 tablet 0  . ondansetron (ZOFRAN) 4 MG tablet TAKE (1) TABLET EVERY SIX HOURS AS NEEDED FOR NAUSEA. (Patient taking differently: TAKE 4MG  BY MOUTH EVERY SIX HOURS AS NEEDED FOR NAUSEA.) 30 tablet 0  . Oxycodone HCl 10 MG TABS Take 10 mg by mouth every 6 (six) hours as needed for pain.    . pantoprazole (PROTONIX) 40 MG tablet TAKE 1 TABLET 30 MINUTES BEFORE BREAKFAST (Patient taking differently: Take 40 mg by mouth daily. ) 90 tablet 1  . promethazine (PHENERGAN) 12.5 MG tablet Take 1 tablet (12.5 mg total) by mouth every 6 (six) hours as needed for nausea or vomiting. 30 tablet 0  . promethazine (PHENERGAN) 25 MG suppository Insert unwrapped suppository every 8-12 hours 12 each 0  . zoledronic acid (RECLAST) 5 MG/100ML SOLN Inject 5 mg into the vein as directed. Once a year...     No current facility-administered medications on file prior to visit.    Review of Systems Constitutional: Negative for other unusual diaphoresis, sweats, appetite or weight changes HENT: Negative for other worsening hearing loss, ear pain, facial swelling, mouth sores or neck stiffness.   Eyes: Negative for other worsening pain, redness or other visual disturbance.  Respiratory: Negative for other stridor or swelling Cardiovascular: Negative for other palpitations or other chest pain  Gastrointestinal: Negative for worsening diarrhea or loose stools, blood in stool, distention or other pain Genitourinary: Negative for hematuria, flank pain or other change in urine volume.  Musculoskeletal: Negative for myalgias or other joint swelling.  Skin: Negative for other color change, or other wound or worsening drainage.  Neurological: Negative for other syncope or numbness. Hematological: Negative for other adenopathy  or swelling Psychiatric/Behavioral: Negative for hallucinations, other worsening agitation, SI, self-injury, or new decreased concentration All other system neg per pt    Objective:   Physical Exam BP 122/88   Pulse 92   Temp 98.3 F (36.8 C) (Oral)   Ht 5\' 3"  (1.6 m)   Wt 125 lb (56.7 kg)   SpO2 97%   BMI 22.14 kg/m  VS noted,  Constitutional: Pt is oriented to person, place, and time. Appears well-developed and well-nourished, in no significant distress and comfortable Head: Normocephalic and atraumatic  Eyes: Conjunctivae and EOM are normal. Pupils are equal, round, and reactive to light Right Ear: External ear normal without discharge Left Ear: External ear normal without discharge Nose: Nose without discharge or deformity Mouth/Throat: Oropharynx is without other ulcerations and moist  Neck:  Normal range of motion. Neck supple. No JVD present. No tracheal deviation present or significant neck LA or mass Cardiovascular: Normal rate, regular rhythm, normal heart sounds and intact distal pulses.   Pulmonary/Chest: WOB normal and breath sounds without rales or wheezing  Abdominal: Soft. Bowel sounds are normal. NT. No HSM  Musculoskeletal: Normal range of motion. Exhibits no edema Lymphadenopathy: Has no other cervical adenopathy.  Neurological: Pt is alert and oriented to person, place, and time. Pt has normal reflexes. No cranial nerve deficit. Motor grossly intact, Gait intact Skin: Skin is warm and dry. No rash noted or new ulcerations Psychiatric:  Has depressed mood and affect. Behavior is normal without agitation No other exam findings  Lab Results  Component Value Date   WBC 6.7 07/17/2017   HGB 19.6 (H) 07/17/2017   HCT 53.6 (H) 07/17/2017   PLT 203 07/17/2017   GLUCOSE 116 (H) 07/17/2017   CHOL 199 08/15/2013   TRIG 106.0 08/15/2013   HDL 77.40 08/15/2013   LDLDIRECT 102.4 05/24/2008   LDLCALC 100 (H) 08/15/2013   ALT 18 07/17/2017   AST 26 07/17/2017   NA  136 07/17/2017   K 3.6 07/17/2017   CL 95 (L) 07/17/2017   CREATININE 0.60 07/17/2017   BUN <5 (L) 07/17/2017   CO2 31 07/17/2017   TSH 0.92 08/15/2013   INR 1.0 08/02/2007       Assessment & Plan:

## 2017-12-22 NOTE — Assessment & Plan Note (Signed)
Counseled to quit, pt not ready 

## 2017-12-22 NOTE — Assessment & Plan Note (Signed)
Unable for spinal stimulator due to depression per pt; to cont pain management, cymbalta 90 qd,

## 2017-12-22 NOTE — Assessment & Plan Note (Signed)
stable overall by history and exam, denies SI or HI, and pt to continue medical treatment as before,  to f/u any worsening symptoms or concerns, declines counseling referral or psychiatry

## 2017-12-22 NOTE — Assessment & Plan Note (Signed)
stable overall by history and exam, for f/u lab today, and pt to continue medical treatment as before,  to f/u any worsening symptoms or concerns 

## 2017-12-23 ENCOUNTER — Telehealth: Payer: Self-pay

## 2017-12-23 ENCOUNTER — Other Ambulatory Visit: Payer: Self-pay | Admitting: Internal Medicine

## 2017-12-23 LAB — HIV ANTIBODY (ROUTINE TESTING W REFLEX): HIV 1&2 Ab, 4th Generation: NONREACTIVE

## 2017-12-23 LAB — URINALYSIS, ROUTINE W REFLEX MICROSCOPIC
Bilirubin Urine: NEGATIVE
Hgb urine dipstick: NEGATIVE
Ketones, ur: NEGATIVE
Leukocytes, UA: NEGATIVE
Nitrite: NEGATIVE
Specific Gravity, Urine: 1.015 (ref 1.000–1.030)
Total Protein, Urine: NEGATIVE
Urine Glucose: NEGATIVE
Urobilinogen, UA: 0.2 (ref 0.0–1.0)
pH: 7 (ref 5.0–8.0)

## 2017-12-23 LAB — VITAMIN D 25 HYDROXY (VIT D DEFICIENCY, FRACTURES): VITD: 14.69 ng/mL — ABNORMAL LOW (ref 30.00–100.00)

## 2017-12-23 LAB — HEPATITIS C ANTIBODY
Hepatitis C Ab: NONREACTIVE
SIGNAL TO CUT-OFF: 0.13 (ref <1.00)

## 2017-12-23 LAB — VITAMIN B12: Vitamin B-12: 317 pg/mL (ref 211–911)

## 2017-12-23 LAB — TSH: TSH: 0.45 u[IU]/mL (ref 0.35–4.50)

## 2017-12-23 LAB — FERRITIN: Ferritin: 216.8 ng/mL (ref 10.0–291.0)

## 2017-12-23 MED ORDER — VITAMIN D3 1.25 MG (50000 UT) PO CAPS: ORAL_CAPSULE | ORAL | 0 refills | Status: DC

## 2017-12-23 NOTE — Telephone Encounter (Signed)
-----   Message from Biagio Borg, MD sent at 12/23/2017 12:11 PM EST ----- Left message on MyChart, pt to cont same tx except  The test results show that your current treatment is OK, except the Vitamin D is low.  Please take Vit D prescription I will send to the pharmacy for 3 months, then keep going with Vit D 2000 units per day OTC after that indefinitely.  [name] to please inform pt, I will do rx

## 2017-12-23 NOTE — Telephone Encounter (Signed)
Pt has been informed and expressed understanding.  

## 2017-12-28 DIAGNOSIS — J189 Pneumonia, unspecified organism: Secondary | ICD-10-CM

## 2017-12-28 HISTORY — DX: Pneumonia, unspecified organism: J18.9

## 2018-01-04 DIAGNOSIS — G894 Chronic pain syndrome: Secondary | ICD-10-CM | POA: Diagnosis not present

## 2018-01-04 DIAGNOSIS — Z79891 Long term (current) use of opiate analgesic: Secondary | ICD-10-CM | POA: Diagnosis not present

## 2018-01-04 DIAGNOSIS — M150 Primary generalized (osteo)arthritis: Secondary | ICD-10-CM | POA: Diagnosis not present

## 2018-01-04 DIAGNOSIS — M961 Postlaminectomy syndrome, not elsewhere classified: Secondary | ICD-10-CM | POA: Diagnosis not present

## 2018-01-20 IMAGING — NM NM BONE WHOLE BODY
4 series · 4 of 4 positions shown · non-contrast
Comparison: Pelvic MRI 03/20/2016

CLINICAL DATA: Sacroiliac joint pain, especially on the right, with
no history of trauma. No history of cancer.

EXAM:
NUCLEAR MEDICINE WHOLE BODY BONE SCAN
TECHNIQUE: Whole body anterior and posterior images were obtained approximately
3 hours after intravenous injection of radiopharmaceutical.
RADIOPHARMACEUTICALS:  25.2 mCi Cechnetium-99m MDP IV

[Series 1: whole body · 2.66mm/px · 1 of 1 slices shown (1 of 2)]
[im 1/1]
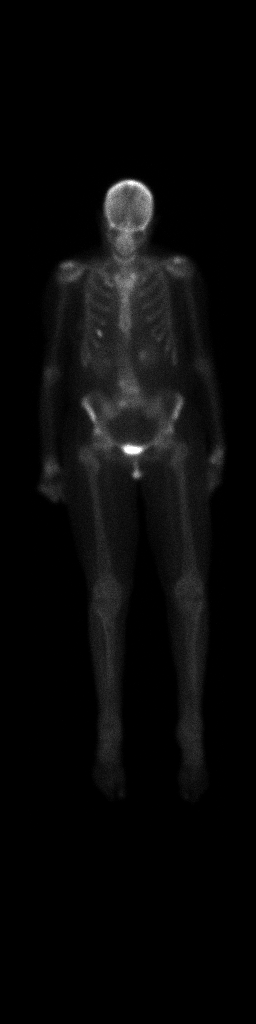

[Series 1: whole body · 2.66mm/px · 1 of 1 slices shown (2 of 2)]
[im 1/1]
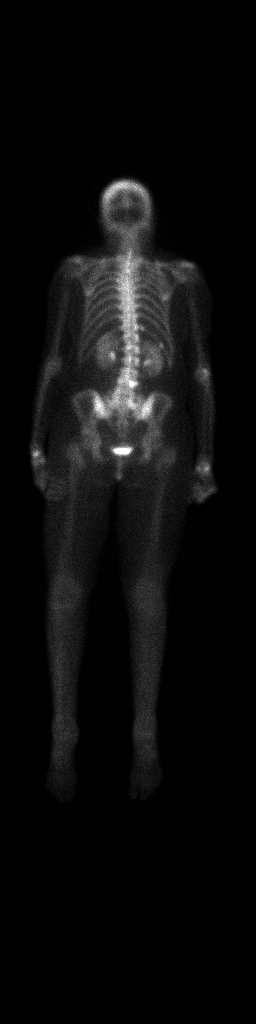

[Series 2: bone static · 2.07mm/px · 1 of 1 slices shown (1 of 2)]
[im 1/1]
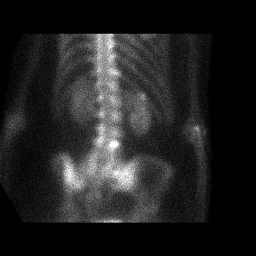

[Series 2: bone static · 2.07mm/px · 1 of 1 slices shown (2 of 2)]
[im 1/1]
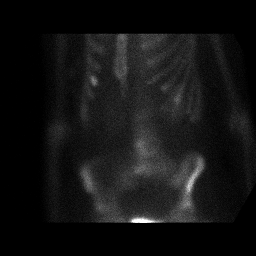

[4 of 4 positions shown; findings below may reference images not displayed]

FINDINGS: No definitive explanation for sacroiliac pain. No typical activity
for sacroiliitis or sacral insufficiency fracture. Dextroscoliosis
with long-standing L4-S1 posterior fixation. There is activity on
the right near the L4 and S1 screws. The patient has undergone
spinal fusion previously. Anterior right fifth and posterior left
eleventh rib activity is focal and likely posttraumatic.

Unremarkable soft tissue activity. Symmetric renal activity with
visible bladder.
IMPRESSION: 1. Right-sided activity at L4 and S1 is near long-standing spinal
fixation hardware. Consider CT to evaluate for hardware
loosening/pseudoarthrosis.
2. Bilateral rib activity which is likely posttraumatic.

## 2018-01-27 DIAGNOSIS — S93491A Sprain of other ligament of right ankle, initial encounter: Secondary | ICD-10-CM | POA: Diagnosis not present

## 2018-02-01 DIAGNOSIS — M79671 Pain in right foot: Secondary | ICD-10-CM | POA: Diagnosis not present

## 2018-02-01 DIAGNOSIS — G894 Chronic pain syndrome: Secondary | ICD-10-CM | POA: Diagnosis not present

## 2018-02-01 DIAGNOSIS — S93491A Sprain of other ligament of right ankle, initial encounter: Secondary | ICD-10-CM | POA: Diagnosis not present

## 2018-02-01 DIAGNOSIS — Z79891 Long term (current) use of opiate analgesic: Secondary | ICD-10-CM | POA: Diagnosis not present

## 2018-02-10 ENCOUNTER — Other Ambulatory Visit: Payer: Self-pay | Admitting: Internal Medicine

## 2018-02-10 MED ORDER — CLONAZEPAM 1 MG PO TABS: 1.0000 mg | ORAL_TABLET | Freq: Every evening | ORAL | 1 refills | Status: DC | PRN

## 2018-02-17 DIAGNOSIS — M4326 Fusion of spine, lumbar region: Secondary | ICD-10-CM | POA: Diagnosis not present

## 2018-02-17 DIAGNOSIS — G894 Chronic pain syndrome: Secondary | ICD-10-CM | POA: Diagnosis not present

## 2018-02-17 DIAGNOSIS — M960 Pseudarthrosis after fusion or arthrodesis: Secondary | ICD-10-CM | POA: Diagnosis not present

## 2018-03-01 DIAGNOSIS — Z79891 Long term (current) use of opiate analgesic: Secondary | ICD-10-CM | POA: Diagnosis not present

## 2018-03-01 DIAGNOSIS — G894 Chronic pain syndrome: Secondary | ICD-10-CM | POA: Diagnosis not present

## 2018-03-01 DIAGNOSIS — S93491A Sprain of other ligament of right ankle, initial encounter: Secondary | ICD-10-CM | POA: Diagnosis not present

## 2018-03-01 DIAGNOSIS — M79671 Pain in right foot: Secondary | ICD-10-CM | POA: Diagnosis not present

## 2018-03-10 DIAGNOSIS — R69 Illness, unspecified: Secondary | ICD-10-CM | POA: Diagnosis not present
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
## 2018-03-24 IMAGING — CT CT L SPINE W/O CM
3 of 4 series · 13 of 33 positions shown, 16 images · non-contrast
Comparison: Bones scan dated 05/01/2016, CT scan of the abdomen
dated 02/24/2013 and CT scan of the lumbar spine dated 10/07/2009

CLINICAL DATA: Severe progressive right hip pain. Right flank pain.

EXAM:
CT LUMBAR SPINE WITHOUT CONTRAST
TECHNIQUE: Multidetector CT imaging of the lumbar spine was performed without
intravenous contrast administration. Multiplanar CT image
reconstructions were also generated.

[Series 3: l-spine · axial · 0.27mm/px · z∈[+1180,+1356]mm · 7 of 106 slices shown, 9 images]
[im 9/106  soft-tissue]
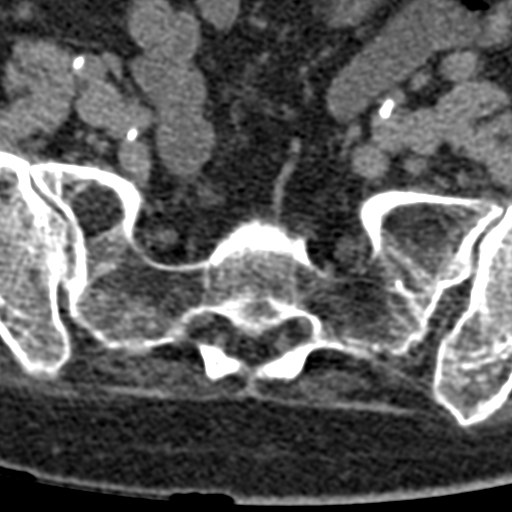
[im 9/106  bone]
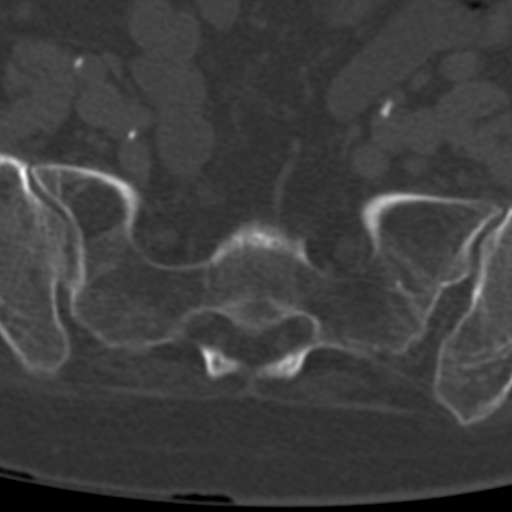
[im 25/106  bone]
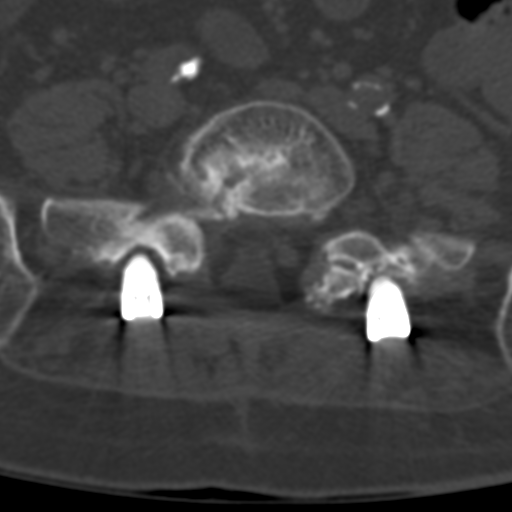
[im 41/106  bone]
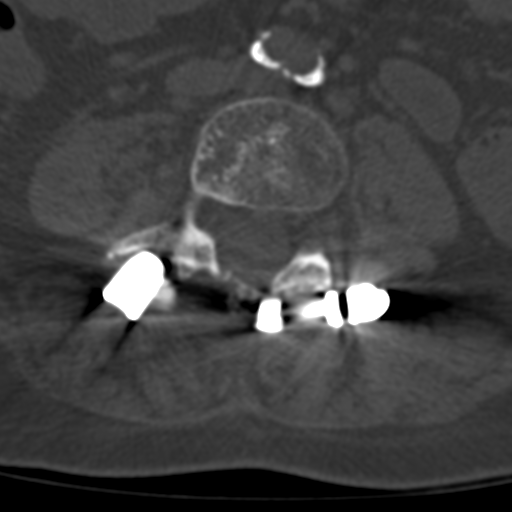
[im 57/106  bone]
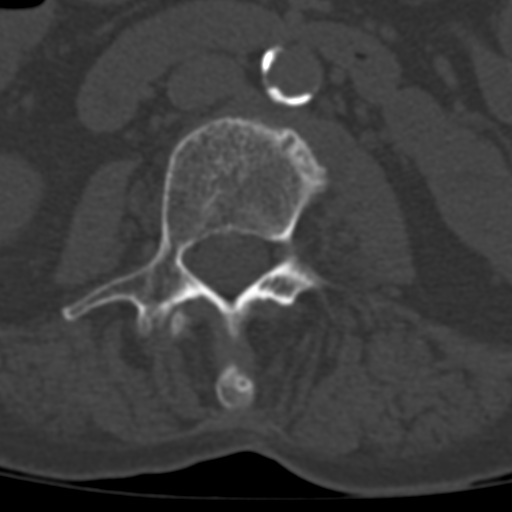
[im 65/106  soft-tissue]
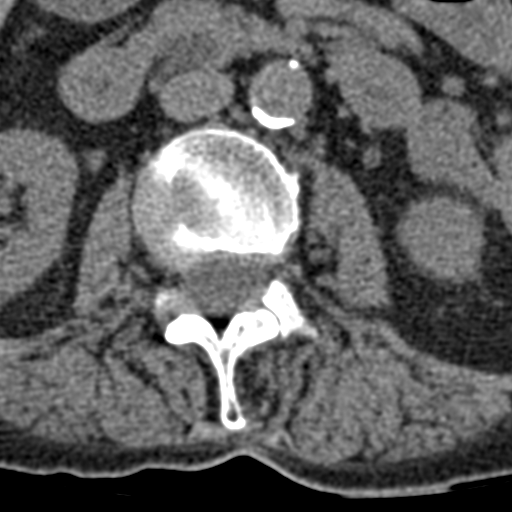
[im 65/106  bone]
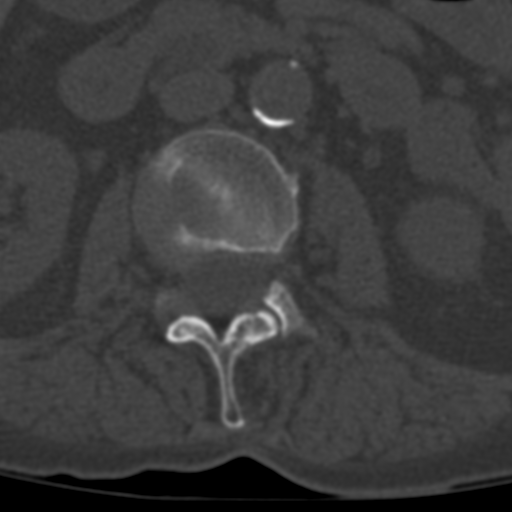
[im 81/106  bone]
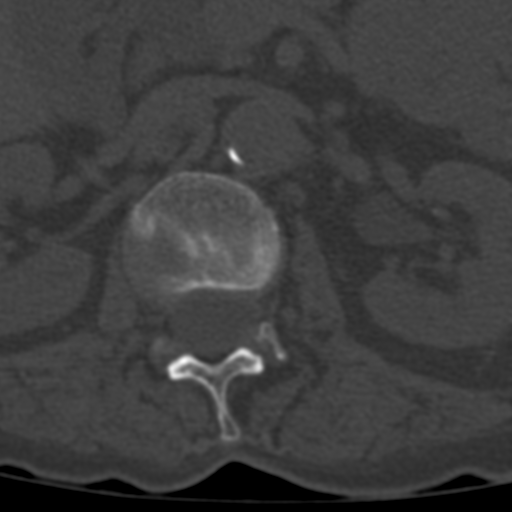
[im 97/106  bone]
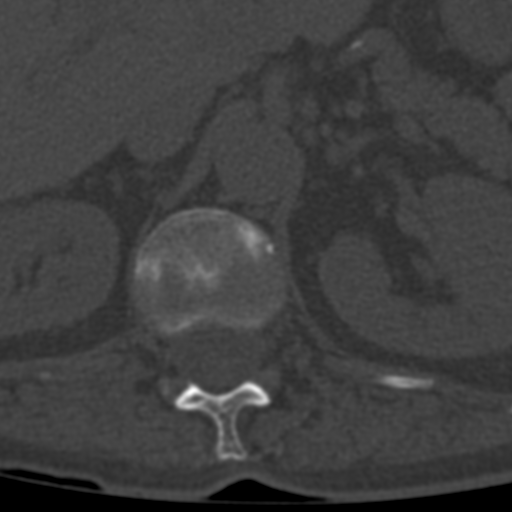

[Series 5: coronal · coronal · 0.33mm/px · 1 of 51 slices shown]
[im 26/51  bone]
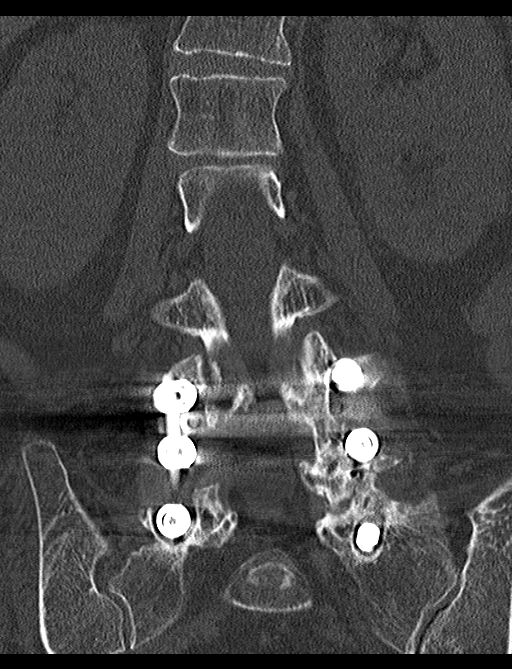

[Series 8: sagittal st · sagittal · 0.31mm/px · 5 of 54 slices shown, 6 images]
[im 18/54  bone]
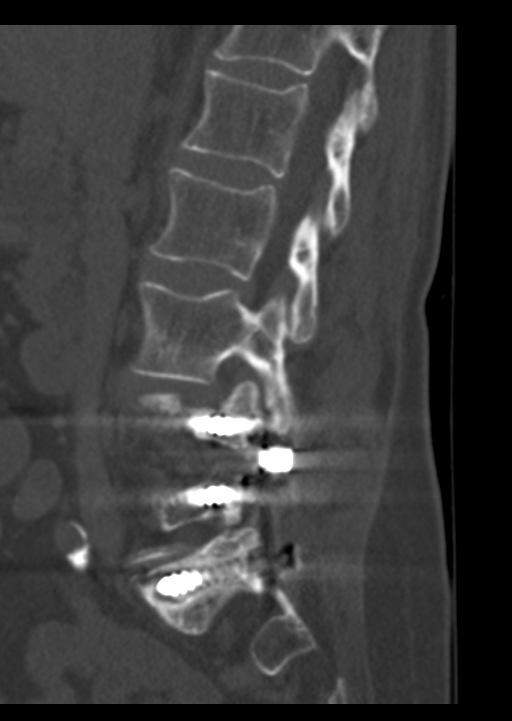
[im 23/54  bone]
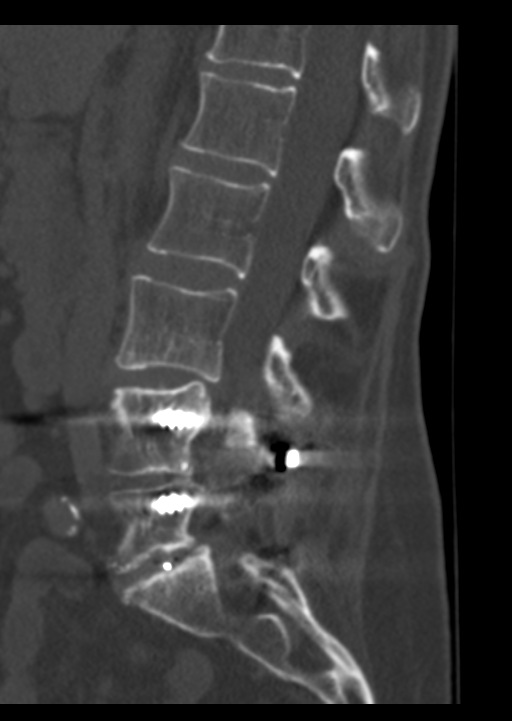
[im 27/54  soft-tissue]
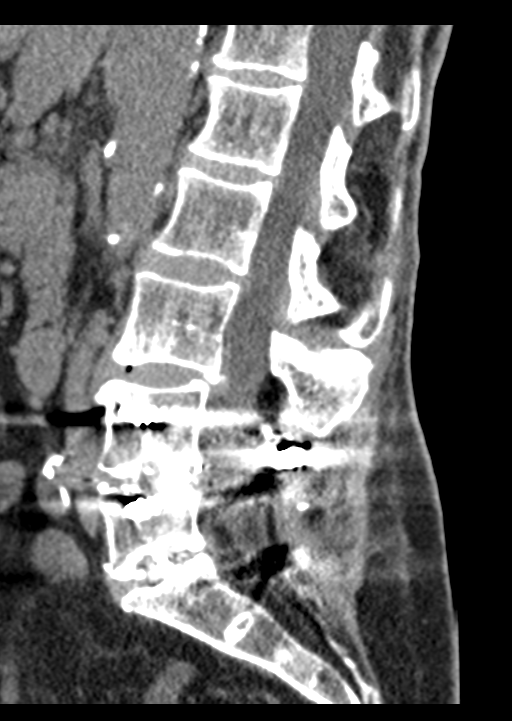
[im 27/54  bone]
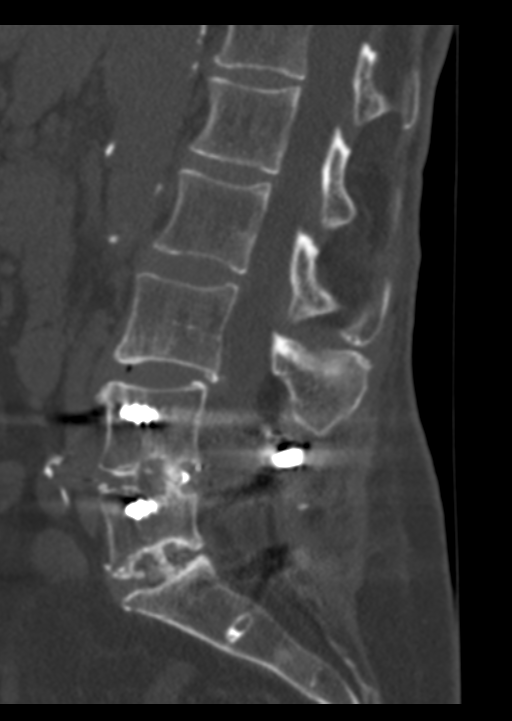
[im 31/54  bone]
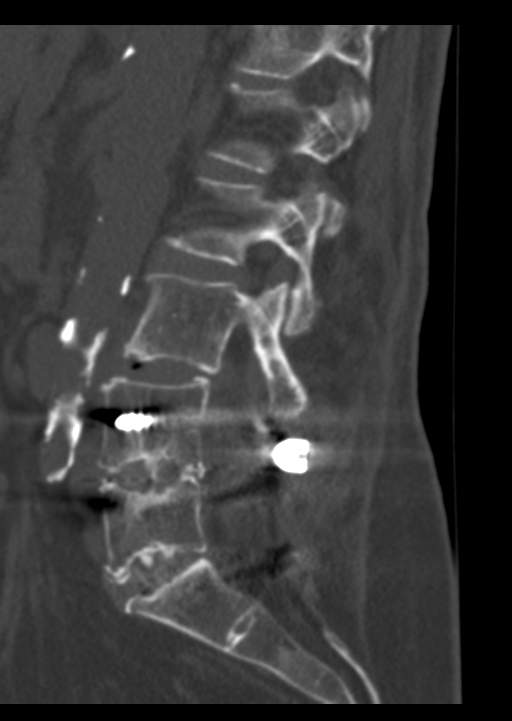
[im 36/54  bone]
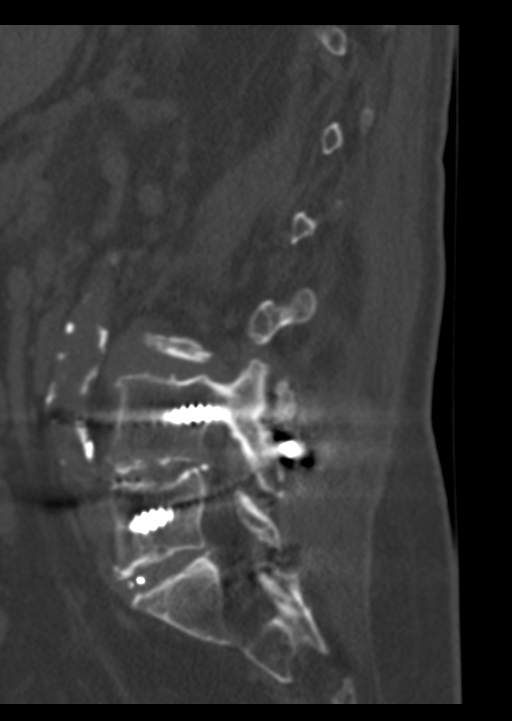

[13 of 33 positions shown; findings below may reference images not displayed]

FINDINGS: T12-L1 and L1-2:  Normal.

L2-3: Small disc bulge into the left neural foramen without focal
neural impingement.

L3-4: Broad-based disc bulge asymmetric into the left neural foramen
slightly compressing the ventral aspect of the thecal sac,
progressed since the prior study.

L4-5: There is a partially healed fracture through the base of the
right pedicle of L4. This is best seen on images 59 and 60 of series
4. This correlates with the area of increased activity on the bone
scan of 05/01/2016. Pedicle screws are in good position at L4-5 and
hardware is intact. Interbody fusion device is integrated with solid
interbody fusion. No visible residual impingement.

L5-S1: Chronic lucency around the pedicle screws with no solid
interbody or posterior fusion. The hardware is intact and in good
position. Slight scarring around the right side of the thecal sac
and the right L5 and S1 nerve root sleeves. This is unchanged since
study of 3474.

Incidental note is made of aortic atherosclerosis.

Minimal degenerative changes of the sacroiliac joints.
IMPRESSION: 1. Partially healed fracture of the right pedicle of L3.
2. Chronic nonunion at L5-S1.
3. Progressive disc degeneration at L3-4 now with a broad-based disc
bulge asymmetric to the left slightly compressing the thecal sac.

## 2018-03-30 DIAGNOSIS — S93491A Sprain of other ligament of right ankle, initial encounter: Secondary | ICD-10-CM | POA: Diagnosis not present

## 2018-03-30 DIAGNOSIS — Z79891 Long term (current) use of opiate analgesic: Secondary | ICD-10-CM | POA: Diagnosis not present

## 2018-03-30 DIAGNOSIS — M79671 Pain in right foot: Secondary | ICD-10-CM | POA: Diagnosis not present

## 2018-03-30 DIAGNOSIS — G894 Chronic pain syndrome: Secondary | ICD-10-CM | POA: Diagnosis not present

## 2018-04-12 IMAGING — CT CT L SPINE W/O CM
3 of 8 series · 12 of 33 positions shown, 14 images · non-contrast
Comparison: CT scan lumbar spine 07/03/2016

CLINICAL DATA: Chronic low back pain. History of lumbar fusion
5996.

EXAM:
CT LUMBAR SPINE WITHOUT CONTRAST
TECHNIQUE: Multidetector CT imaging of the lumbar spine was performed without
intravenous contrast administration. Multiplanar CT image
reconstructions were also generated.

[Series 2: l spine soft · axial · 0.25mm/px · z∈[-507,-357]mm · 4 of 72 slices shown, 5 images]
[im 11/72  soft-tissue]
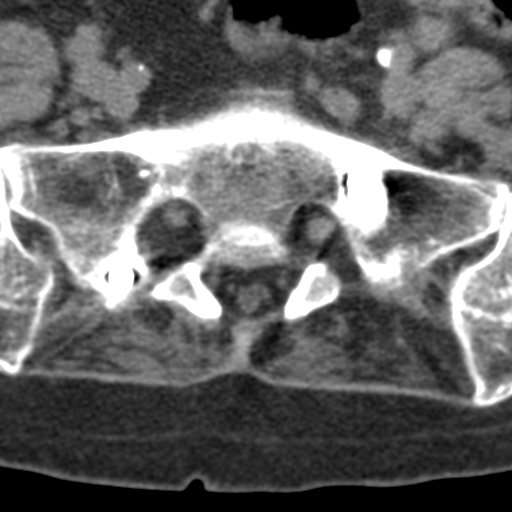
[im 11/72  bone]
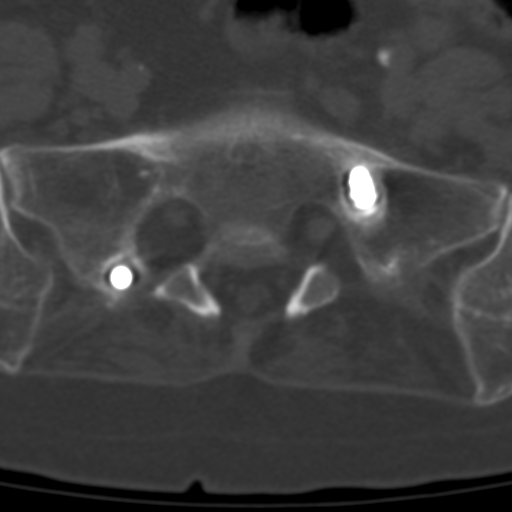
[im 31/72  bone]
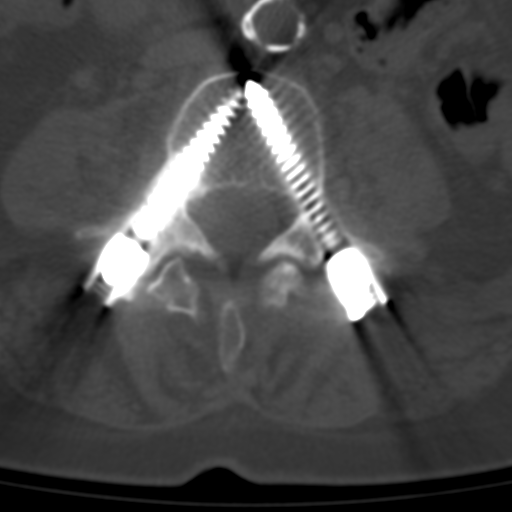
[im 41/72  bone]
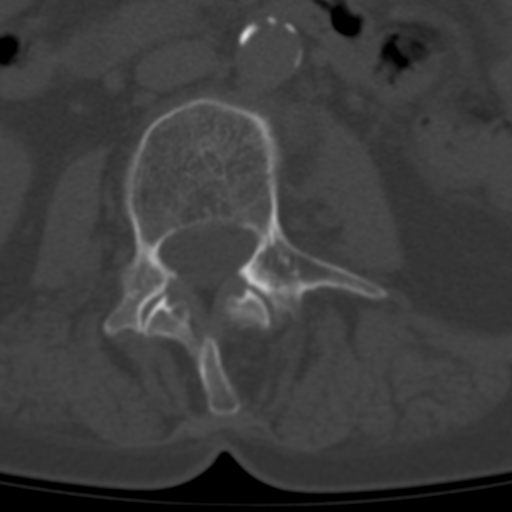
[im 61/72  bone]
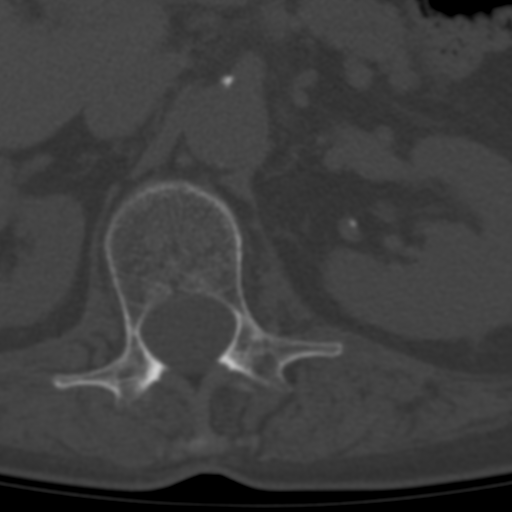

[Series 6: bone cor · coronal · 0.26mm/px · 3 of 39 slices shown]
[im 8/39  bone]
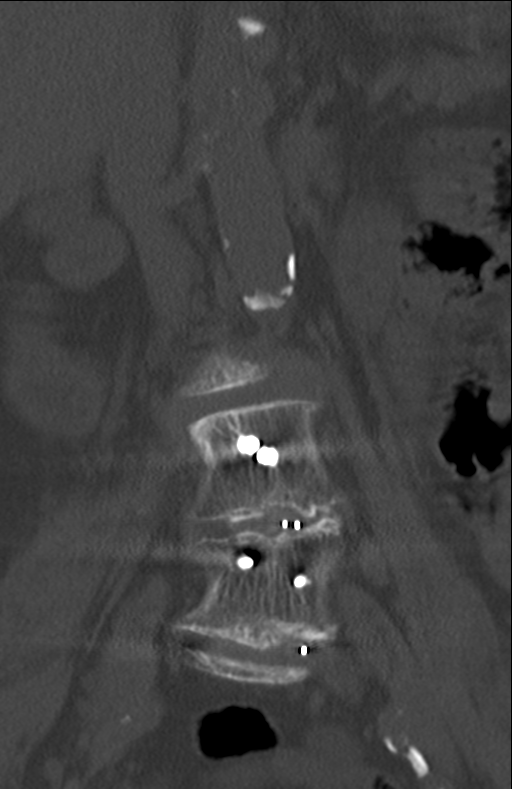
[im 16/39  bone]
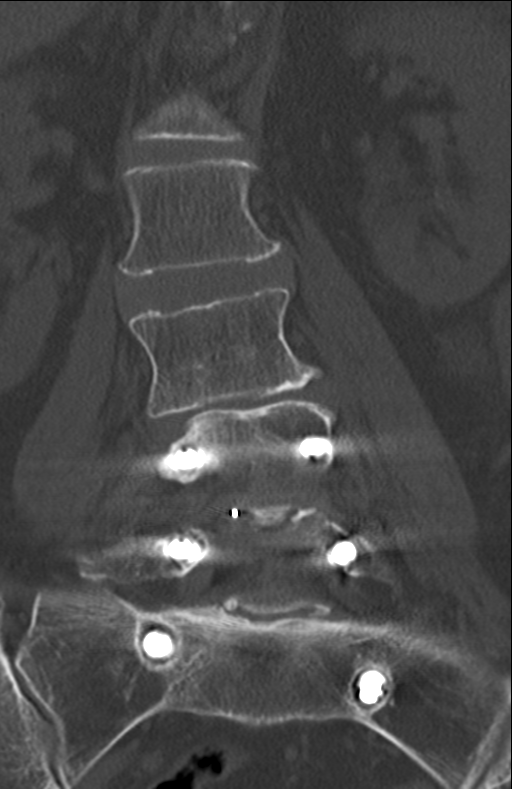
[im 23/39  bone]
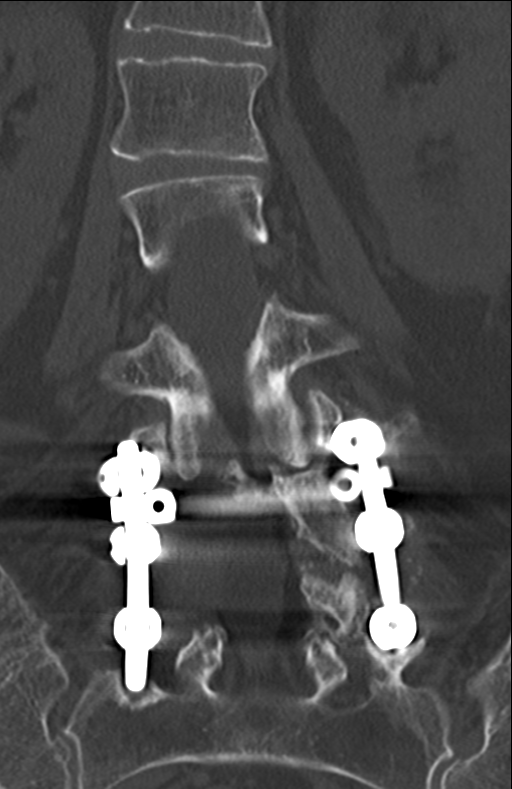

[Series 7: sag bone · sagittal · 0.25mm/px · 5 of 42 slices shown, 6 images]
[im 14/42  bone]
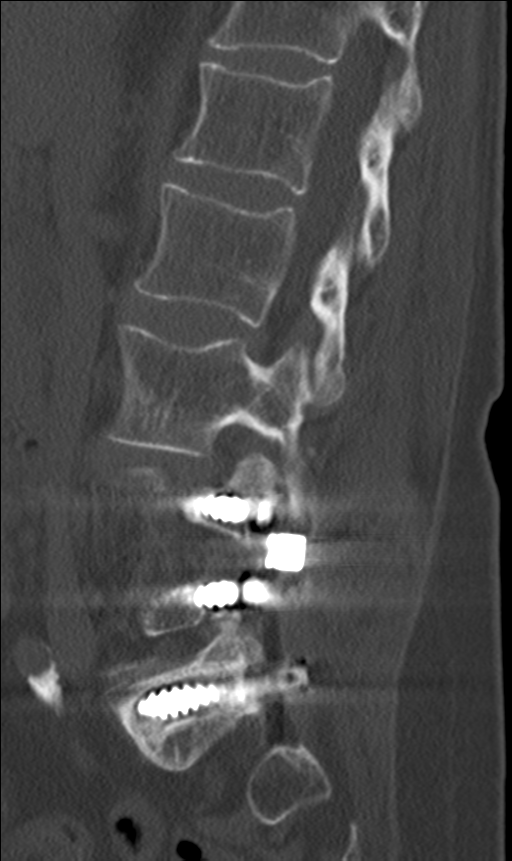
[im 18/42  bone]
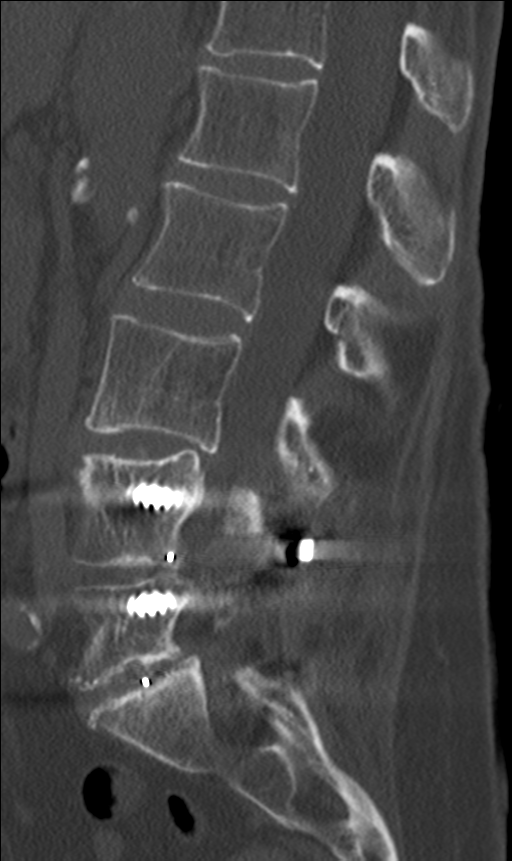
[im 21/42  soft-tissue]
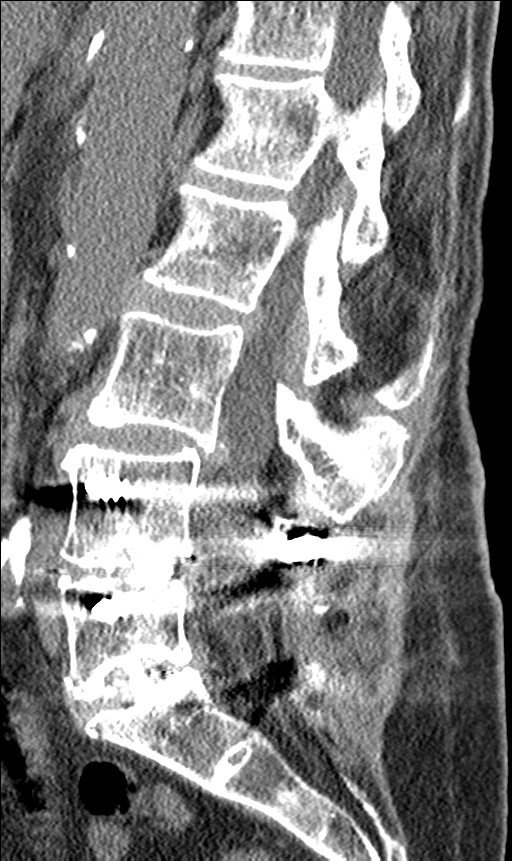
[im 21/42  bone]
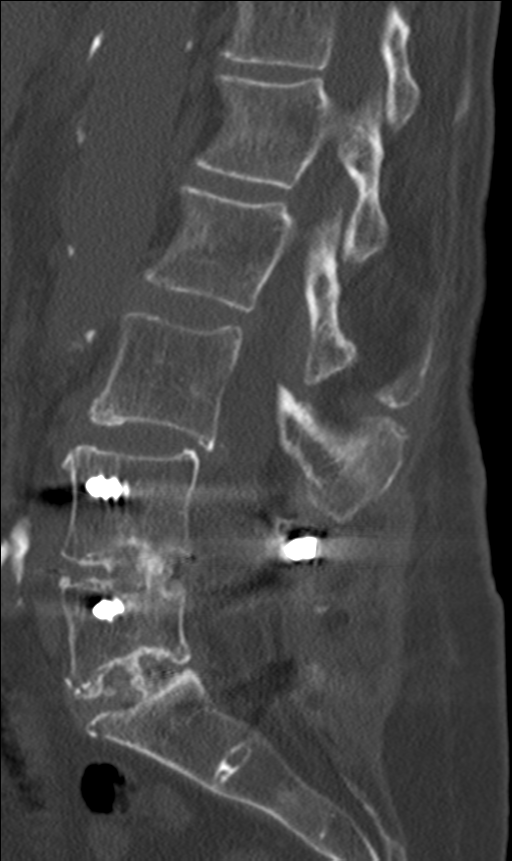
[im 24/42  bone]
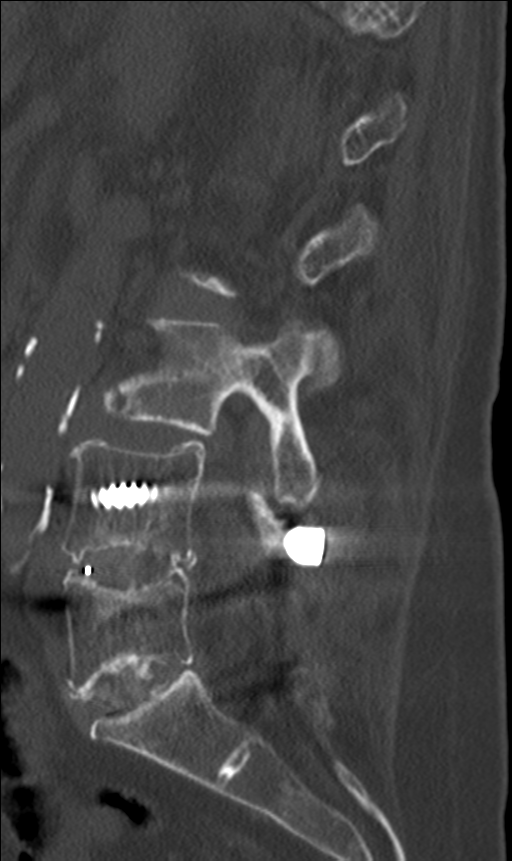
[im 28/42  bone]
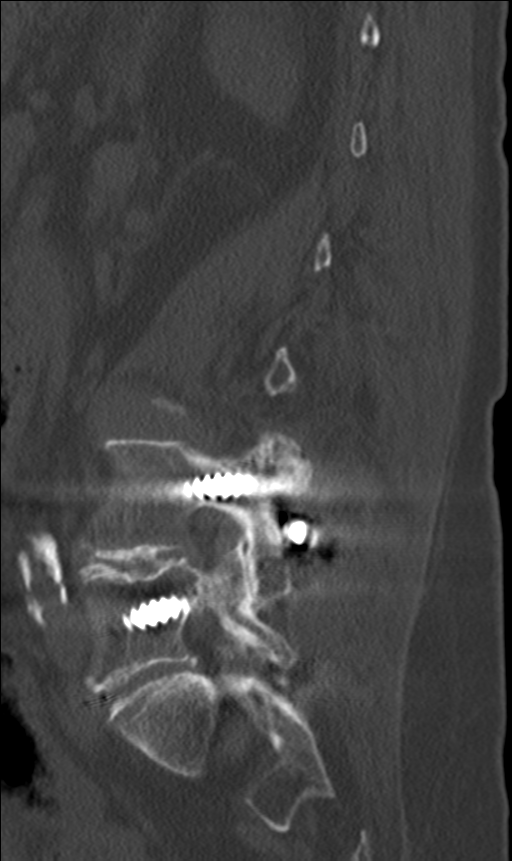

[12 of 33 positions shown; findings below may reference images not displayed]

FINDINGS: Stable old fusion hardware at L4-5 and L5-S1 with pedicle screws,
posterior rods and interbody fusion devices. No significant change
since the recent prior study. There are areas of solid interbody
fusion. There is a partially healed right pedicle fracture at L4.
Stable loose S1 pedicle screws bilaterally.

Stable mild degenerative retrolisthesis of L3

L1-2, L2-3 and L3-4 do not demonstrate any significant findings.
Stable diffuse bulging annulus at L3-4. The spinal canal is quite
generous and there is no significant spinal, lateral recess or
foraminal stenosis.
IMPRESSION: No significant change since recent prior CT scan.

Stable fusion hardware at L4-5 and L5-S1. The S1 pedicle screws are
loose.

Minimal but definite areas of interbody fusion at L4-5 and L5-S1.

Partially healed right L3 pedicle fracture.

## 2018-04-27 DIAGNOSIS — M961 Postlaminectomy syndrome, not elsewhere classified: Secondary | ICD-10-CM | POA: Diagnosis not present

## 2018-04-27 DIAGNOSIS — M79671 Pain in right foot: Secondary | ICD-10-CM | POA: Diagnosis not present

## 2018-04-27 DIAGNOSIS — G894 Chronic pain syndrome: Secondary | ICD-10-CM | POA: Diagnosis not present

## 2018-04-27 DIAGNOSIS — Z79891 Long term (current) use of opiate analgesic: Secondary | ICD-10-CM | POA: Diagnosis not present

## 2018-05-07 ENCOUNTER — Encounter (HOSPITAL_COMMUNITY): Payer: Self-pay

## 2018-05-07 ENCOUNTER — Emergency Department (HOSPITAL_COMMUNITY): Payer: Medicare HMO

## 2018-05-07 ENCOUNTER — Emergency Department (HOSPITAL_COMMUNITY)
Admission: EM | Admit: 2018-05-07 | Discharge: 2018-05-07 | Disposition: A | Discharge status: Home / Self Care | Payer: Medicare HMO | Attending: Emergency Medicine | Admitting: Emergency Medicine

## 2018-05-07 DIAGNOSIS — Z041 Encounter for examination and observation following transport accident: Secondary | ICD-10-CM | POA: Diagnosis present

## 2018-05-07 DIAGNOSIS — I10 Essential (primary) hypertension: Secondary | ICD-10-CM | POA: Insufficient documentation

## 2018-05-07 DIAGNOSIS — R0789 Other chest pain: Secondary | ICD-10-CM | POA: Diagnosis not present

## 2018-05-07 DIAGNOSIS — R69 Illness, unspecified: Secondary | ICD-10-CM | POA: Diagnosis not present

## 2018-05-07 DIAGNOSIS — F17210 Nicotine dependence, cigarettes, uncomplicated: Secondary | ICD-10-CM | POA: Diagnosis not present

## 2018-05-07 DIAGNOSIS — Z79899 Other long term (current) drug therapy: Secondary | ICD-10-CM | POA: Diagnosis not present

## 2018-05-07 DIAGNOSIS — S299XXA Unspecified injury of thorax, initial encounter: Secondary | ICD-10-CM | POA: Diagnosis not present

## 2018-05-07 DIAGNOSIS — S3690XA Unspecified injury of unspecified intra-abdominal organ, initial encounter: Secondary | ICD-10-CM | POA: Diagnosis not present

## 2018-05-07 DIAGNOSIS — R079 Chest pain, unspecified: Secondary | ICD-10-CM | POA: Diagnosis not present

## 2018-05-07 MED ORDER — OXYCODONE-ACETAMINOPHEN 5-325 MG PO TABS: 1.0000 | ORAL_TABLET | ORAL | 0 refills | Status: DC | PRN

## 2018-05-07 MED ORDER — DIAZEPAM 2 MG PO TABS
2.0000 mg | ORAL_TABLET | Freq: Once | ORAL | Status: AC
  Administered 2018-05-07: 2 mg via ORAL
  Filled 2018-05-07: qty 1

## 2018-05-07 MED ORDER — OXYCODONE-ACETAMINOPHEN 5-325 MG PO TABS
1.0000 | ORAL_TABLET | Freq: Once | ORAL | Status: AC
  Administered 2018-05-07: 1 via ORAL
  Filled 2018-05-07: qty 1

## 2018-05-07 MED ORDER — METHOCARBAMOL 500 MG PO TABS: 500.0000 mg | ORAL_TABLET | Freq: Two times a day (BID) | ORAL | 0 refills | Status: DC
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
## 2018-05-07 NOTE — ED Notes (Signed)
Verbalized understanding discharge instructions, prescriptions, and follow-up. In no acute distress.   
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
## 2018-05-12 ENCOUNTER — Encounter (HOSPITAL_COMMUNITY): Payer: Self-pay | Admitting: Emergency Medicine

## 2018-05-12 ENCOUNTER — Other Ambulatory Visit: Payer: Self-pay

## 2018-05-12 ENCOUNTER — Inpatient Hospital Stay (HOSPITAL_COMMUNITY)
Admission: EM | Admit: 2018-05-12 | Discharge: 2018-05-16 | DRG: 189 | Disposition: A | Discharge status: Home Health | Payer: Medicare HMO | Attending: Internal Medicine | Admitting: Internal Medicine

## 2018-05-12 ENCOUNTER — Emergency Department (HOSPITAL_COMMUNITY): Payer: Medicare HMO

## 2018-05-12 DIAGNOSIS — J189 Pneumonia, unspecified organism: Secondary | ICD-10-CM | POA: Diagnosis not present

## 2018-05-12 DIAGNOSIS — J441 Chronic obstructive pulmonary disease with (acute) exacerbation: Secondary | ICD-10-CM | POA: Diagnosis present

## 2018-05-12 DIAGNOSIS — R0602 Shortness of breath: Secondary | ICD-10-CM

## 2018-05-12 DIAGNOSIS — Z7983 Long term (current) use of bisphosphonates: Secondary | ICD-10-CM

## 2018-05-12 DIAGNOSIS — Z881 Allergy status to other antibiotic agents status: Secondary | ICD-10-CM

## 2018-05-12 DIAGNOSIS — T40601A Poisoning by unspecified narcotics, accidental (unintentional), initial encounter: Secondary | ICD-10-CM | POA: Diagnosis present

## 2018-05-12 DIAGNOSIS — R0789 Other chest pain: Secondary | ICD-10-CM

## 2018-05-12 DIAGNOSIS — G8929 Other chronic pain: Secondary | ICD-10-CM | POA: Diagnosis present

## 2018-05-12 DIAGNOSIS — J9601 Acute respiratory failure with hypoxia: Secondary | ICD-10-CM | POA: Diagnosis not present

## 2018-05-12 DIAGNOSIS — Z8249 Family history of ischemic heart disease and other diseases of the circulatory system: Secondary | ICD-10-CM

## 2018-05-12 DIAGNOSIS — F1020 Alcohol dependence, uncomplicated: Secondary | ICD-10-CM | POA: Diagnosis present

## 2018-05-12 DIAGNOSIS — K210 Gastro-esophageal reflux disease with esophagitis: Secondary | ICD-10-CM | POA: Diagnosis present

## 2018-05-12 DIAGNOSIS — R079 Chest pain, unspecified: Secondary | ICD-10-CM | POA: Diagnosis not present

## 2018-05-12 DIAGNOSIS — Z882 Allergy status to sulfonamides status: Secondary | ICD-10-CM

## 2018-05-12 DIAGNOSIS — Z888 Allergy status to other drugs, medicaments and biological substances status: Secondary | ICD-10-CM

## 2018-05-12 DIAGNOSIS — F17210 Nicotine dependence, cigarettes, uncomplicated: Secondary | ICD-10-CM | POA: Diagnosis present

## 2018-05-12 DIAGNOSIS — F17200 Nicotine dependence, unspecified, uncomplicated: Secondary | ICD-10-CM | POA: Diagnosis present

## 2018-05-12 DIAGNOSIS — W57XXXA Bitten or stung by nonvenomous insect and other nonvenomous arthropods, initial encounter: Secondary | ICD-10-CM | POA: Diagnosis present

## 2018-05-12 DIAGNOSIS — M549 Dorsalgia, unspecified: Secondary | ICD-10-CM | POA: Diagnosis present

## 2018-05-12 DIAGNOSIS — G894 Chronic pain syndrome: Secondary | ICD-10-CM | POA: Diagnosis not present

## 2018-05-12 DIAGNOSIS — J42 Unspecified chronic bronchitis: Secondary | ICD-10-CM | POA: Diagnosis present

## 2018-05-12 DIAGNOSIS — I10 Essential (primary) hypertension: Secondary | ICD-10-CM | POA: Diagnosis present

## 2018-05-12 DIAGNOSIS — J9600 Acute respiratory failure, unspecified whether with hypoxia or hypercapnia: Secondary | ICD-10-CM | POA: Diagnosis present

## 2018-05-12 DIAGNOSIS — K209 Esophagitis, unspecified without bleeding: Secondary | ICD-10-CM | POA: Diagnosis present

## 2018-05-12 DIAGNOSIS — E860 Dehydration: Secondary | ICD-10-CM | POA: Diagnosis present

## 2018-05-12 DIAGNOSIS — K219 Gastro-esophageal reflux disease without esophagitis: Secondary | ICD-10-CM | POA: Diagnosis present

## 2018-05-12 DIAGNOSIS — R000 Tachycardia, unspecified: Secondary | ICD-10-CM | POA: Diagnosis present

## 2018-05-12 DIAGNOSIS — J440 Chronic obstructive pulmonary disease with acute lower respiratory infection: Secondary | ICD-10-CM | POA: Diagnosis present

## 2018-05-12 DIAGNOSIS — F411 Generalized anxiety disorder: Secondary | ICD-10-CM | POA: Diagnosis present

## 2018-05-12 DIAGNOSIS — Z79899 Other long term (current) drug therapy: Secondary | ICD-10-CM

## 2018-05-12 HISTORY — DX: Pneumonia, unspecified organism: J18.9

## 2018-05-12 HISTORY — DX: Esophagitis, unspecified without bleeding: K20.90

## 2018-05-12 HISTORY — DX: Acute respiratory failure, unspecified whether with hypoxia or hypercapnia: J96.00

## 2018-05-12 HISTORY — DX: Esophagitis, unspecified: K20.9

## 2018-05-12 HISTORY — DX: Other long term (current) drug therapy: Z79.899

## 2018-05-12 HISTORY — DX: Alcohol dependence, uncomplicated: F10.20

## 2018-05-12 LAB — BASIC METABOLIC PANEL
Anion gap: 12 (ref 5–15)
BUN: <5 mg/dL — ABNORMAL LOW (ref 6–20)
CO2: 27 mmol/L (ref 22–32)
Calcium: 8.8 mg/dL — ABNORMAL LOW (ref 8.9–10.3)
Chloride: 96 mmol/L — ABNORMAL LOW (ref 101–111)
Creatinine, Ser: 0.61 mg/dL (ref 0.44–1.00)
GFR calc Af Amer: >60 mL/min (ref >60)
GFR calc non Af Amer: >60 mL/min (ref >60)
Glucose, Bld: 107 mg/dL — ABNORMAL HIGH (ref 65–99)
Potassium: 3.8 mmol/L (ref 3.5–5.1)
Sodium: 135 mmol/L (ref 135–145)

## 2018-05-12 LAB — I-STAT TROPONIN, ED: Troponin i, poc: 0 ng/mL (ref 0.00–0.08)
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
## 2018-05-13 ENCOUNTER — Emergency Department (HOSPITAL_COMMUNITY): Payer: Medicare HMO

## 2018-05-13 ENCOUNTER — Inpatient Hospital Stay: Payer: Medicare HMO | Admitting: Internal Medicine

## 2018-05-13 ENCOUNTER — Other Ambulatory Visit: Payer: Self-pay

## 2018-05-13 ENCOUNTER — Encounter (HOSPITAL_COMMUNITY): Payer: Self-pay | Admitting: Internal Medicine

## 2018-05-13 DIAGNOSIS — J189 Pneumonia, unspecified organism: Secondary | ICD-10-CM | POA: Diagnosis not present

## 2018-05-13 DIAGNOSIS — K209 Esophagitis, unspecified without bleeding: Secondary | ICD-10-CM | POA: Diagnosis present

## 2018-05-13 DIAGNOSIS — R0602 Shortness of breath: Secondary | ICD-10-CM | POA: Diagnosis not present

## 2018-05-13 DIAGNOSIS — E860 Dehydration: Secondary | ICD-10-CM | POA: Diagnosis present

## 2018-05-13 DIAGNOSIS — Z8249 Family history of ischemic heart disease and other diseases of the circulatory system: Secondary | ICD-10-CM | POA: Diagnosis not present

## 2018-05-13 DIAGNOSIS — M549 Dorsalgia, unspecified: Secondary | ICD-10-CM | POA: Diagnosis present

## 2018-05-13 DIAGNOSIS — Z881 Allergy status to other antibiotic agents status: Secondary | ICD-10-CM | POA: Diagnosis not present

## 2018-05-13 DIAGNOSIS — Z79899 Other long term (current) drug therapy: Secondary | ICD-10-CM | POA: Diagnosis not present

## 2018-05-13 DIAGNOSIS — J9601 Acute respiratory failure with hypoxia: Secondary | ICD-10-CM | POA: Diagnosis not present

## 2018-05-13 DIAGNOSIS — R0789 Other chest pain: Secondary | ICD-10-CM | POA: Diagnosis not present

## 2018-05-13 DIAGNOSIS — J440 Chronic obstructive pulmonary disease with acute lower respiratory infection: Secondary | ICD-10-CM | POA: Diagnosis present

## 2018-05-13 DIAGNOSIS — R079 Chest pain, unspecified: Secondary | ICD-10-CM | POA: Diagnosis not present

## 2018-05-13 DIAGNOSIS — J9600 Acute respiratory failure, unspecified whether with hypoxia or hypercapnia: Secondary | ICD-10-CM | POA: Diagnosis present

## 2018-05-13 DIAGNOSIS — W57XXXA Bitten or stung by nonvenomous insect and other nonvenomous arthropods, initial encounter: Secondary | ICD-10-CM | POA: Diagnosis present

## 2018-05-13 DIAGNOSIS — S299XXA Unspecified injury of thorax, initial encounter: Secondary | ICD-10-CM | POA: Diagnosis not present

## 2018-05-13 DIAGNOSIS — R69 Illness, unspecified: Secondary | ICD-10-CM | POA: Diagnosis not present

## 2018-05-13 DIAGNOSIS — F17210 Nicotine dependence, cigarettes, uncomplicated: Secondary | ICD-10-CM | POA: Diagnosis present

## 2018-05-13 DIAGNOSIS — G894 Chronic pain syndrome: Secondary | ICD-10-CM | POA: Diagnosis not present

## 2018-05-13 DIAGNOSIS — K210 Gastro-esophageal reflux disease with esophagitis: Secondary | ICD-10-CM | POA: Diagnosis present

## 2018-05-13 DIAGNOSIS — T40601A Poisoning by unspecified narcotics, accidental (unintentional), initial encounter: Secondary | ICD-10-CM | POA: Diagnosis present

## 2018-05-13 DIAGNOSIS — I10 Essential (primary) hypertension: Secondary | ICD-10-CM | POA: Diagnosis present

## 2018-05-13 DIAGNOSIS — R000 Tachycardia, unspecified: Secondary | ICD-10-CM | POA: Diagnosis present

## 2018-05-13 DIAGNOSIS — Z888 Allergy status to other drugs, medicaments and biological substances status: Secondary | ICD-10-CM | POA: Diagnosis not present

## 2018-05-13 DIAGNOSIS — F1020 Alcohol dependence, uncomplicated: Secondary | ICD-10-CM | POA: Diagnosis present

## 2018-05-13 DIAGNOSIS — J441 Chronic obstructive pulmonary disease with (acute) exacerbation: Secondary | ICD-10-CM | POA: Diagnosis present

## 2018-05-13 DIAGNOSIS — Z882 Allergy status to sulfonamides status: Secondary | ICD-10-CM | POA: Diagnosis not present

## 2018-05-13 DIAGNOSIS — Z7983 Long term (current) use of bisphosphonates: Secondary | ICD-10-CM | POA: Diagnosis not present

## 2018-05-13 DIAGNOSIS — F411 Generalized anxiety disorder: Secondary | ICD-10-CM | POA: Diagnosis present

## 2018-05-13 LAB — CBC
HCT: 50.4 % — ABNORMAL HIGH (ref 36.0–46.0)
Hemoglobin: 16.7 g/dL — ABNORMAL HIGH (ref 12.0–15.0)
MCH: 33.3 pg (ref 26.0–34.0)
MCHC: 33.1 g/dL (ref 30.0–36.0)
MCV: 100.6 fL — ABNORMAL HIGH (ref 78.0–100.0)
Platelets: 309 10*3/uL (ref 150–400)
RBC: 5.01 MIL/uL (ref 3.87–5.11)
RDW: 13.2 % (ref 11.5–15.5)
WBC: 10.5 10*3/uL (ref 4.0–10.5)

## 2018-05-13 LAB — TROPONIN I
Troponin I: <0.03 ng/mL (ref <0.03)
Troponin I: <0.03 ng/mL (ref <0.03)
Troponin I: <0.03 ng/mL (ref <0.03)

## 2018-05-13 LAB — I-STAT CG4 LACTIC ACID, ED: Lactic Acid, Venous: 0.67 mmol/L (ref 0.5–1.9)

## 2018-05-13 LAB — HIV ANTIBODY (ROUTINE TESTING W REFLEX): HIV Screen 4th Generation wRfx: NONREACTIVE

## 2018-05-13 LAB — TSH: TSH: 0.718 u[IU]/mL (ref 0.350–4.500)

## 2018-05-13 MED ORDER — LORAZEPAM 1 MG PO TABS
1.0000 mg | ORAL_TABLET | Freq: Four times a day (QID) | ORAL | Status: DC | PRN
  Administered 2018-05-13: 1 mg via ORAL
  Filled 2018-05-13: qty 1

## 2018-05-13 MED ORDER — ADULT MULTIVITAMIN W/MINERALS CH
1.0000 | ORAL_TABLET | Freq: Every day | ORAL | Status: DC
  Administered 2018-05-13 – 2018-05-16 (×4): 1 via ORAL
  Filled 2018-05-13 (×4): qty 1

## 2018-05-13 MED ORDER — ALBUTEROL SULFATE (2.5 MG/3ML) 0.083% IN NEBU
2.5000 mg | INHALATION_SOLUTION | Freq: Four times a day (QID) | RESPIRATORY_TRACT | Status: DC
  Administered 2018-05-13 – 2018-05-14 (×4): 2.5 mg via RESPIRATORY_TRACT
  Filled 2018-05-13 (×4): qty 3

## 2018-05-13 MED ORDER — OXYCODONE-ACETAMINOPHEN 5-325 MG PO TABS: 1.0000 | ORAL_TABLET | ORAL | Status: DC | PRN

## 2018-05-13 MED ORDER — CLONAZEPAM 0.5 MG PO TABS: 0.2500 mg | ORAL_TABLET | Freq: Every day | ORAL | Status: DC

## 2018-05-13 MED ORDER — IOPAMIDOL (ISOVUE-370) INJECTION 76%
100.0000 mL | Freq: Once | INTRAVENOUS | Status: AC | PRN
  Administered 2018-05-13: 100 mL via INTRAVENOUS

## 2018-05-13 MED ORDER — HYDROMORPHONE HCL 2 MG/ML IJ SOLN
1.0000 mg | Freq: Once | INTRAMUSCULAR | Status: AC
  Administered 2018-05-13: 1 mg via INTRAVENOUS
  Filled 2018-05-13: qty 1

## 2018-05-13 MED ORDER — FENTANYL 12 MCG/HR TD PT72
25.0000 ug | MEDICATED_PATCH | TRANSDERMAL | Status: DC
  Administered 2018-05-13: 25 ug via TRANSDERMAL
  Filled 2018-05-13: qty 1

## 2018-05-13 MED ORDER — GABAPENTIN 300 MG PO CAPS: 300.0000 mg | ORAL_CAPSULE | Freq: Three times a day (TID) | ORAL | Status: DC

## 2018-05-13 MED ORDER — DULOXETINE HCL 60 MG PO CPEP
90.0000 mg | ORAL_CAPSULE | Freq: Every day | ORAL | Status: DC
  Administered 2018-05-13 – 2018-05-16 (×4): 90 mg via ORAL
  Filled 2018-05-13 (×5): qty 1

## 2018-05-13 MED ORDER — ENOXAPARIN SODIUM 40 MG/0.4ML ~~LOC~~ SOLN
40.0000 mg | SUBCUTANEOUS | Status: DC
Start: 2018-05-13 — End: 2018-05-16
  Administered 2018-05-13 – 2018-05-15 (×3): 40 mg via SUBCUTANEOUS
  Filled 2018-05-13 (×4): qty 0.4

## 2018-05-13 MED ORDER — SODIUM CHLORIDE 0.9 % IV SOLN
INTRAVENOUS | Status: AC
  Administered 2018-05-13 – 2018-05-14 (×3): via INTRAVENOUS

## 2018-05-13 MED ORDER — VITAMIN B-1 100 MG PO TABS
100.0000 mg | ORAL_TABLET | Freq: Every day | ORAL | Status: DC
  Administered 2018-05-14 – 2018-05-16 (×3): 100 mg via ORAL
  Filled 2018-05-13 (×3): qty 1

## 2018-05-13 MED ORDER — THIAMINE HCL 100 MG/ML IJ SOLN
100.0000 mg | Freq: Every day | INTRAMUSCULAR | Status: DC
  Administered 2018-05-13: 100 mg via INTRAVENOUS
  Filled 2018-05-13: qty 2

## 2018-05-13 MED ORDER — LORAZEPAM 2 MG/ML IJ SOLN
1.0000 mg | Freq: Once | INTRAMUSCULAR | Status: AC
  Administered 2018-05-13: 1 mg via INTRAVENOUS
  Filled 2018-05-13: qty 1

## 2018-05-13 MED ORDER — SENNOSIDES-DOCUSATE SODIUM 8.6-50 MG PO TABS: 1.0000 | ORAL_TABLET | Freq: Every evening | ORAL | Status: DC | PRN

## 2018-05-13 MED ORDER — ONDANSETRON HCL 4 MG/2ML IJ SOLN
4.0000 mg | Freq: Four times a day (QID) | INTRAMUSCULAR | Status: DC | PRN
  Administered 2018-05-14: 4 mg via INTRAVENOUS
  Filled 2018-05-13: qty 2

## 2018-05-13 MED ORDER — MORPHINE SULFATE 15 MG PO TABS
15.0000 mg | ORAL_TABLET | Freq: Three times a day (TID) | ORAL | Status: DC
Start: 2018-05-13 — End: 2018-05-13

## 2018-05-13 MED ORDER — SODIUM CHLORIDE 0.9 % IV SOLN
100.0000 mg | Freq: Once | INTRAVENOUS | Status: AC
  Administered 2018-05-13: 100 mg via INTRAVENOUS
  Filled 2018-05-13: qty 100

## 2018-05-13 MED ORDER — FOLIC ACID 1 MG PO TABS
1.0000 mg | ORAL_TABLET | Freq: Every day | ORAL | Status: DC
  Administered 2018-05-13 – 2018-05-16 (×4): 1 mg via ORAL
  Filled 2018-05-13 (×4): qty 1

## 2018-05-13 MED ORDER — MORPHINE SULFATE 15 MG PO TABS
15.0000 mg | ORAL_TABLET | Freq: Two times a day (BID) | ORAL | Status: DC
Start: 2018-05-14 — End: 2018-05-14

## 2018-05-13 MED ORDER — CLONAZEPAM 0.5 MG PO TABS: 1.0000 mg | ORAL_TABLET | Freq: Every day | ORAL | Status: DC

## 2018-05-13 MED ORDER — CLONAZEPAM 0.25 MG PO TBDP
0.2500 mg | ORAL_TABLET | Freq: Every day | ORAL | Status: DC
  Administered 2018-05-13 – 2018-05-15 (×3): 0.25 mg via ORAL
  Filled 2018-05-13 (×3): qty 1

## 2018-05-13 MED ORDER — ONDANSETRON HCL 4 MG PO TABS: 4.0000 mg | ORAL_TABLET | Freq: Four times a day (QID) | ORAL | Status: DC | PRN

## 2018-05-13 MED ORDER — KETOROLAC TROMETHAMINE 15 MG/ML IJ SOLN
15.0000 mg | Freq: Four times a day (QID) | INTRAMUSCULAR | Status: DC | PRN
  Administered 2018-05-13 – 2018-05-14 (×4): 15 mg via INTRAVENOUS
  Filled 2018-05-13 (×4): qty 1

## 2018-05-13 MED ORDER — SODIUM CHLORIDE 0.9 % IV BOLUS
500.0000 mL | Freq: Once | INTRAVENOUS | Status: AC
  Administered 2018-05-13: 500 mL via INTRAVENOUS

## 2018-05-13 MED ORDER — PANTOPRAZOLE SODIUM 40 MG PO TBEC: 40.0000 mg | DELAYED_RELEASE_TABLET | Freq: Every day | ORAL | Status: DC | PRN

## 2018-05-13 MED ORDER — FENTANYL 25 MCG/HR TD PT72: 50.0000 ug | MEDICATED_PATCH | TRANSDERMAL | Status: DC

## 2018-05-13 MED ORDER — SODIUM CHLORIDE 0.9 % IV SOLN
1.0000 g | INTRAVENOUS | Status: DC
  Administered 2018-05-13 – 2018-05-16 (×4): 1 g via INTRAVENOUS
  Filled 2018-05-13 (×4): qty 10

## 2018-05-13 MED ORDER — LORAZEPAM 2 MG/ML IJ SOLN: 1.0000 mg | Freq: Four times a day (QID) | INTRAMUSCULAR | Status: DC | PRN

## 2018-05-13 MED ORDER — IOPAMIDOL (ISOVUE-370) INJECTION 76%
INTRAVENOUS | Status: AC
  Filled 2018-05-13: qty 100

## 2018-05-13 MED ORDER — GI COCKTAIL ~~LOC~~: 30.0000 mL | Freq: Two times a day (BID) | ORAL | Status: DC | PRN

## 2018-05-13 MED ORDER — ONDANSETRON HCL 4 MG/2ML IJ SOLN
4.0000 mg | Freq: Once | INTRAMUSCULAR | Status: AC
Start: 2018-05-13 — End: 2018-05-13
  Administered 2018-05-13: 4 mg via INTRAVENOUS
  Filled 2018-05-13: qty 2
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
## 2018-05-14 DIAGNOSIS — R0789 Other chest pain: Secondary | ICD-10-CM

## 2018-05-14 DIAGNOSIS — J189 Pneumonia, unspecified organism: Secondary | ICD-10-CM

## 2018-05-14 DIAGNOSIS — G894 Chronic pain syndrome: Secondary | ICD-10-CM

## 2018-05-14 LAB — CBC
HCT: 41.1 % (ref 36.0–46.0)
Hemoglobin: 13.4 g/dL (ref 12.0–15.0)
MCH: 33.9 pg (ref 26.0–34.0)
MCHC: 32.6 g/dL (ref 30.0–36.0)
MCV: 104.1 fL — ABNORMAL HIGH (ref 78.0–100.0)
Platelets: 207 10*3/uL (ref 150–400)
RBC: 3.95 MIL/uL (ref 3.87–5.11)
RDW: 13.3 % (ref 11.5–15.5)
WBC: 8.2 10*3/uL (ref 4.0–10.5)

## 2018-05-14 LAB — BASIC METABOLIC PANEL
Anion gap: 9 (ref 5–15)
BUN: 6 mg/dL (ref 6–20)
CO2: 24 mmol/L (ref 22–32)
Calcium: 7.8 mg/dL — ABNORMAL LOW (ref 8.9–10.3)
Chloride: 103 mmol/L (ref 101–111)
Creatinine, Ser: 0.53 mg/dL (ref 0.44–1.00)
GFR calc Af Amer: >60 mL/min (ref >60)
GFR calc non Af Amer: >60 mL/min (ref >60)
Glucose, Bld: 88 mg/dL (ref 65–99)
Potassium: 3.8 mmol/L (ref 3.5–5.1)
Sodium: 136 mmol/L (ref 135–145)

## 2018-05-14 MED ORDER — LIDOCAINE 5 % EX PTCH
1.0000 | MEDICATED_PATCH | CUTANEOUS | Status: DC
  Administered 2018-05-14 – 2018-05-16 (×3): 1 via TRANSDERMAL
  Filled 2018-05-14 (×3): qty 1

## 2018-05-14 MED ORDER — NICOTINE 21 MG/24HR TD PT24
21.0000 mg | MEDICATED_PATCH | Freq: Every day | TRANSDERMAL | Status: DC
  Administered 2018-05-15: 21 mg via TRANSDERMAL
  Filled 2018-05-14: qty 1

## 2018-05-14 MED ORDER — FENTANYL 50 MCG/HR TD PT72
50.0000 ug | MEDICATED_PATCH | TRANSDERMAL | Status: DC
  Administered 2018-05-14: 50 ug via TRANSDERMAL
  Filled 2018-05-14: qty 4

## 2018-05-14 MED ORDER — NICOTINE 21 MG/24HR TD PT24
21.0000 mg | MEDICATED_PATCH | Freq: Every day | TRANSDERMAL | Status: DC
  Administered 2018-05-14 (×2): 21 mg via TRANSDERMAL
  Filled 2018-05-14 (×2): qty 1

## 2018-05-14 MED ORDER — IPRATROPIUM-ALBUTEROL 0.5-2.5 (3) MG/3ML IN SOLN
3.0000 mL | Freq: Four times a day (QID) | RESPIRATORY_TRACT | Status: DC
  Administered 2018-05-14 – 2018-05-16 (×5): 3 mL via RESPIRATORY_TRACT
  Filled 2018-05-14 (×8): qty 3

## 2018-05-14 MED ORDER — ALBUTEROL SULFATE (2.5 MG/3ML) 0.083% IN NEBU: 2.5000 mg | INHALATION_SOLUTION | RESPIRATORY_TRACT | Status: DC | PRN

## 2018-05-14 MED ORDER — MORPHINE SULFATE 15 MG PO TABS
15.0000 mg | ORAL_TABLET | Freq: Four times a day (QID) | ORAL | Status: DC | PRN
  Administered 2018-05-14 – 2018-05-15 (×3): 15 mg via ORAL
  Filled 2018-05-14 (×3): qty 1
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
## 2018-05-15 ENCOUNTER — Inpatient Hospital Stay (HOSPITAL_COMMUNITY): Payer: Medicare HMO

## 2018-05-15 LAB — CBC
HCT: 41.5 % (ref 36.0–46.0)
Hemoglobin: 13.6 g/dL (ref 12.0–15.0)
MCH: 33.7 pg (ref 26.0–34.0)
MCHC: 32.8 g/dL (ref 30.0–36.0)
MCV: 102.7 fL — ABNORMAL HIGH (ref 78.0–100.0)
Platelets: 221 10*3/uL (ref 150–400)
RBC: 4.04 MIL/uL (ref 3.87–5.11)
RDW: 13.1 % (ref 11.5–15.5)
WBC: 8.5 10*3/uL (ref 4.0–10.5)

## 2018-05-15 LAB — BASIC METABOLIC PANEL
Anion gap: 8 (ref 5–15)
BUN: 10 mg/dL (ref 6–20)
CO2: 26 mmol/L (ref 22–32)
Calcium: 8.4 mg/dL — ABNORMAL LOW (ref 8.9–10.3)
Chloride: 102 mmol/L (ref 101–111)
Creatinine, Ser: 0.6 mg/dL (ref 0.44–1.00)
GFR calc Af Amer: >60 mL/min (ref >60)
GFR calc non Af Amer: >60 mL/min (ref >60)
Glucose, Bld: 93 mg/dL (ref 65–99)
Potassium: 3.2 mmol/L — ABNORMAL LOW (ref 3.5–5.1)
Sodium: 136 mmol/L (ref 135–145)

## 2018-05-15 MED ORDER — NAPROXEN 375 MG PO TABS
375.0000 mg | ORAL_TABLET | Freq: Two times a day (BID) | ORAL | Status: DC
  Administered 2018-05-15 – 2018-05-16 (×3): 375 mg via ORAL
  Filled 2018-05-15 (×4): qty 1

## 2018-05-15 MED ORDER — POTASSIUM CHLORIDE CRYS ER 20 MEQ PO TBCR
40.0000 meq | EXTENDED_RELEASE_TABLET | Freq: Once | ORAL | Status: DC
  Filled 2018-05-15: qty 2

## 2018-05-15 MED ORDER — NAPROXEN 250 MG PO TABS: 375.0000 mg | ORAL_TABLET | Freq: Two times a day (BID) | ORAL | Status: DC

## 2018-05-15 MED ORDER — POTASSIUM CHLORIDE 20 MEQ PO PACK
40.0000 meq | PACK | Freq: Once | ORAL | Status: AC
  Administered 2018-05-15: 40 meq via ORAL
  Filled 2018-05-15: qty 2

## 2018-05-15 MED ORDER — MORPHINE SULFATE 15 MG PO TABS
15.0000 mg | ORAL_TABLET | ORAL | Status: DC | PRN
  Administered 2018-05-15 – 2018-05-16 (×5): 15 mg via ORAL
  Filled 2018-05-15 (×6): qty 1

## 2018-05-15 MED ORDER — MORPHINE SULFATE 15 MG PO TABS: 15.0000 mg | ORAL_TABLET | Freq: Three times a day (TID) | ORAL | Status: DC | PRN

## 2018-05-15 NOTE — Progress Notes (Signed)
SATURATION QUALIFICATIONS: (This note is used to comply with regulatory documentation for home oxygen)  Patient Saturations on Room Air at Rest = 90%  Patient Saturations on Room Air while Ambulating = 86%  Patient Saturations on 3 Liters of oxygen while Ambulating = 94%  Please briefly explain why patient needs home oxygen: Patient desaturated to 86% on RA while ambulating
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
## 2018-05-16 MED ORDER — IPRATROPIUM-ALBUTEROL 0.5-2.5 (3) MG/3ML IN SOLN
3.0000 mL | Freq: Three times a day (TID) | RESPIRATORY_TRACT | Status: DC
  Filled 2018-05-16 (×2): qty 3

## 2018-05-16 MED ORDER — ALBUTEROL SULFATE HFA 108 (90 BASE) MCG/ACT IN AERS: 2.0000 | INHALATION_SPRAY | Freq: Four times a day (QID) | RESPIRATORY_TRACT | 2 refills | Status: DC | PRN

## 2018-05-16 MED ORDER — NAPROXEN 375 MG PO TABS: 375.0000 mg | ORAL_TABLET | Freq: Two times a day (BID) | ORAL | 0 refills | Status: DC

## 2018-05-16 MED ORDER — AMOXICILLIN-POT CLAVULANATE 875-125 MG PO TABS: 1.0000 | ORAL_TABLET | Freq: Two times a day (BID) | ORAL | 0 refills | Status: DC

## 2018-05-16 NOTE — Progress Notes (Signed)
Patient to D/C today. Orders received at 8am. Still waiting on DME oxygen delivery to room.   English as a second language teacher

## 2018-05-16 NOTE — Plan of Care (Signed)
Discussed plan of care with patient.  Stressed the importance of not smoking ans using the incentive spirometer.  Patient displayed some teach back.
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
## 2018-05-16 NOTE — Progress Notes (Signed)
OT Note  - Addendum    05/16/18 1400  OT Visit Information  Last OT Received On 05/16/18  OT Time Calculation  OT Start Time (ACUTE ONLY) 0848  OT Stop Time (ACUTE ONLY) 0917  OT Time Calculation (min) 29 min  OT General Charges  $OT Visit 1 Visit  OT Evaluation  $OT Eval Low Complexity 1 Low  OT Treatments  $Self Care/Home Management  8-22 mins  Maurie Boettcher, OT/L  OT Clinical Specialist [phone]
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
## 2018-05-16 NOTE — Care Management Note (Signed)
Case Management Note  Patient Details  Name: Denise King MRN: [id number] Date of Birth: July 06, 1951  Subjective/Objective:   From home with spouse and daughter, pta indep,  Presents with early pneumonia , chest wall pain, chronic pain syndrome, suspected copd, alcohol, anxiety d/o.  She will need HHRN and home oxygen.  NCM spoke to patient regarding HHRN and Home oxygen , she chose [facility] from [employer].  Referral made to Butch Penny with [facility].  Awaiting call back to make sure they can take patient.                 Action/Plan: DC home with HHRN, and home oxygen when set up.  Expected Discharge Date:  05/16/18               Expected Discharge Plan:  [place]  In-House Referral:     Discharge planning Services  CM Consult  Post Acute Care Choice:  Home Health, Durable Medical Equipment Choice offered to:  Patient  DME Arranged:  Oxygen DME Agency:  [place]:  RN [facility] Agency:  [place]  Status of Service:  Completed, signed off  If discussed at [place] of Stay Meetings, dates discussed:    Additional Comments:  Zenon Mayo, RN 05/16/2018, 8:47 AM

## 2018-05-16 NOTE — Progress Notes (Signed)
Per patient, she stated she had three bowel movements on Saturday, May 14, 2018.

## 2018-05-18 DIAGNOSIS — J9600 Acute respiratory failure, unspecified whether with hypoxia or hypercapnia: Secondary | ICD-10-CM | POA: Diagnosis not present

## 2018-05-18 LAB — CULTURE, BLOOD (ROUTINE X 2)
Culture: NO GROWTH
Culture: NO GROWTH
Special Requests: ADEQUATE
Special Requests: ADEQUATE

## 2018-05-20 ENCOUNTER — Emergency Department (HOSPITAL_BASED_OUTPATIENT_CLINIC_OR_DEPARTMENT_OTHER): Payer: Medicare HMO

## 2018-05-20 ENCOUNTER — Emergency Department (HOSPITAL_BASED_OUTPATIENT_CLINIC_OR_DEPARTMENT_OTHER)
Admission: EM | Admit: 2018-05-20 | Discharge: 2018-05-20 | Disposition: A | Discharge status: Home / Self Care | Payer: Medicare HMO | Attending: Emergency Medicine | Admitting: Emergency Medicine

## 2018-05-20 ENCOUNTER — Ambulatory Visit (INDEPENDENT_AMBULATORY_CARE_PROVIDER_SITE_OTHER): Payer: Medicare HMO | Admitting: Internal Medicine

## 2018-05-20 ENCOUNTER — Encounter: Payer: Self-pay | Admitting: Internal Medicine

## 2018-05-20 ENCOUNTER — Other Ambulatory Visit: Payer: Self-pay

## 2018-05-20 DIAGNOSIS — I10 Essential (primary) hypertension: Secondary | ICD-10-CM | POA: Diagnosis not present

## 2018-05-20 DIAGNOSIS — M7989 Other specified soft tissue disorders: Secondary | ICD-10-CM | POA: Diagnosis not present

## 2018-05-20 DIAGNOSIS — Z79899 Other long term (current) drug therapy: Secondary | ICD-10-CM | POA: Insufficient documentation

## 2018-05-20 DIAGNOSIS — F17210 Nicotine dependence, cigarettes, uncomplicated: Secondary | ICD-10-CM | POA: Diagnosis not present

## 2018-05-20 DIAGNOSIS — R600 Localized edema: Secondary | ICD-10-CM | POA: Insufficient documentation

## 2018-05-20 DIAGNOSIS — R300 Dysuria: Secondary | ICD-10-CM

## 2018-05-20 DIAGNOSIS — F411 Generalized anxiety disorder: Secondary | ICD-10-CM

## 2018-05-20 DIAGNOSIS — R079 Chest pain, unspecified: Secondary | ICD-10-CM | POA: Diagnosis not present

## 2018-05-20 DIAGNOSIS — R69 Illness, unspecified: Secondary | ICD-10-CM | POA: Diagnosis not present

## 2018-05-20 LAB — URINALYSIS, ROUTINE W REFLEX MICROSCOPIC
Bilirubin Urine: NEGATIVE
Glucose, UA: NEGATIVE mg/dL
Hgb urine dipstick: NEGATIVE
Ketones, ur: NEGATIVE mg/dL
Leukocytes, UA: NEGATIVE
Nitrite: NEGATIVE
Protein, ur: NEGATIVE mg/dL
Specific Gravity, Urine: <1.005 — ABNORMAL LOW (ref 1.005–1.030)
pH: 6.5 (ref 5.0–8.0)

## 2018-05-20 MED ORDER — NITROFURANTOIN MACROCRYSTAL 50 MG PO CAPS: 50.0000 mg | ORAL_CAPSULE | Freq: Four times a day (QID) | ORAL | 0 refills | Status: DC

## 2018-05-20 MED ORDER — PHENAZOPYRIDINE HCL 200 MG PO TABS: 200.0000 mg | ORAL_TABLET | Freq: Three times a day (TID) | ORAL | 0 refills | Status: DC
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
## 2018-05-20 NOTE — ED Notes (Signed)
ED Provider at bedside. 
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
## 2018-05-20 NOTE — Assessment & Plan Note (Signed)
stable overall by history and exam, and pt to continue medical treatment as before,  to f/u any worsening symptoms or concerns 

## 2018-05-20 NOTE — Discharge Instructions (Addendum)
As discussed, your Ultrasound showed no evidence of a blood clot.  Follow up with your Primary care provider. Return if symptoms worsen, chest pain, shortness of breath or other new concerning symptoms in the meantime.

## 2018-05-20 NOTE — Assessment & Plan Note (Signed)
Likely uti, for which I have done nitrofurantoin erx, but pt instructed to not take before going to ED now, as ideally would also have urine cx obtained prior

## 2018-05-20 NOTE — Patient Instructions (Addendum)
Please go to the ER now as you will need an urgent evaluation of the right leg for blood clot (DVT)  Please take all new medication as prescribed - the antibiotic (but only to start after seen in the ER)  Please continue all other medications as before, and refills have been done if requested.  Please have the pharmacy call with any other refills you may need.  Please keep your appointments with your specialists as you may have planned
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
## 2018-05-20 NOTE — Assessment & Plan Note (Signed)
Mild but significant RLE diffuse swelling worse at the foot and ankle but extending some above the knee to below the hip, without tender cords or erythema; cant r/o DVT; given her hx pt should ideally present to ED for RLE venous doppler, as we cannot obtain one urgently at this time

## 2018-05-24 ENCOUNTER — Ambulatory Visit: Payer: Medicare HMO | Admitting: Internal Medicine

## 2018-05-24 ENCOUNTER — Other Ambulatory Visit: Payer: Self-pay | Admitting: *Deleted

## 2018-05-24 NOTE — Patient Outreach (Signed)
[facility]) Care Management  05/24/2018  Denise King 10-Jan-1951 [id number]   EMMI-  RED ON EMMI ALERT Day # 4, 5 and 6 Date: 05/21/18, 05/22/18 05/23/18 Red Alert Reason:   05/21/18 wheezing more than yesterday? Yes  05/22/18 & 05/23/18: had diarrhea or felt sick to stomach? Yes And Selling in hand/feet or changes in weight? Yes 5//27/19 smoker? Yes Feeling better overall? No   Outreach attempt #1  No answer. RN CM left HIPAA compliant voicemail message along with contact info.  Plan: RN CM will return a call within 4 business days RN CM will send unsuccessful outreach letter to patient    Joelene Millin L. Lavina Hamman, RN, BSN, CCM [facility] Telephonic Care Management Care Coordinator Direct number [phone]  [facility] number [phone] Fax number [phone]
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
## 2018-05-26 ENCOUNTER — Other Ambulatory Visit: Payer: Self-pay | Admitting: *Deleted

## 2018-05-26 DIAGNOSIS — G894 Chronic pain syndrome: Secondary | ICD-10-CM | POA: Diagnosis not present

## 2018-05-26 DIAGNOSIS — M961 Postlaminectomy syndrome, not elsewhere classified: Secondary | ICD-10-CM | POA: Diagnosis not present

## 2018-05-26 DIAGNOSIS — Z79891 Long term (current) use of opiate analgesic: Secondary | ICD-10-CM | POA: Diagnosis not present

## 2018-05-26 DIAGNOSIS — M79671 Pain in right foot: Secondary | ICD-10-CM | POA: Diagnosis not present

## 2018-05-26 NOTE — Patient Outreach (Signed)
[facility]) Care Management  05/26/2018  Denise King Jul 13, 1951 [id number]   EMMI-  RED ON EMMI ALERT Day # 4, 5,  6, 8 Date: 05/21/18, 05/22/18 05/23/18, 05/25/18 Red Alert Reason:   05/21/18 wheezing more than yesterday? Yes  05/22/18 & 05/23/18: had diarrhea or felt sick to stomach? Yes And Selling in hand/feet or changes in weight? Yes 5//27/19 smoker? Yes Feeling better overall? No 05/25/18 swelling in hands/feet or changes in weight? Yes    Outreach attempt #2 No answer. RN CM left HIPAA compliant voicemail message along with contact info.  Plan: RN CM will return a call within 4 business days RN CM was sent an unsuccessful outreach letter on 05/24/18   Joelene Millin L. Lavina Hamman, RN, BSN, CCM [facility] Telephonic Care Management Care Coordinator Direct number [phone]  Main [facility] number [phone] Fax number [phone]

## 2018-05-27 ENCOUNTER — Other Ambulatory Visit: Payer: Self-pay | Admitting: *Deleted

## 2018-05-27 ENCOUNTER — Encounter: Payer: Self-pay | Admitting: Internal Medicine

## 2018-05-27 ENCOUNTER — Ambulatory Visit (INDEPENDENT_AMBULATORY_CARE_PROVIDER_SITE_OTHER): Payer: Medicare HMO | Admitting: Internal Medicine

## 2018-05-27 VITALS — BP 118/84 | HR 94 | Temp 98.0°F | Ht 63.0 in | Wt 131.0 lb

## 2018-05-27 DIAGNOSIS — Z0000 Encounter for general adult medical examination without abnormal findings: Secondary | ICD-10-CM

## 2018-05-27 DIAGNOSIS — F411 Generalized anxiety disorder: Secondary | ICD-10-CM | POA: Diagnosis not present

## 2018-05-27 DIAGNOSIS — F329 Major depressive disorder, single episode, unspecified: Secondary | ICD-10-CM

## 2018-05-27 DIAGNOSIS — F32A Depression, unspecified: Secondary | ICD-10-CM

## 2018-05-27 DIAGNOSIS — R739 Hyperglycemia, unspecified: Secondary | ICD-10-CM | POA: Diagnosis not present

## 2018-05-27 DIAGNOSIS — R69 Illness, unspecified: Secondary | ICD-10-CM | POA: Diagnosis not present

## 2018-05-27 MED ORDER — DULOXETINE HCL 60 MG PO CPEP: 60.0000 mg | ORAL_CAPSULE | Freq: Every day | ORAL | 3 refills | Status: DC
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
## 2018-05-27 NOTE — Patient Instructions (Addendum)
Ok to increase the cymbalta to 60 mg per day  Please continue all other medications as before, and refills have been done if requested.  Please have the pharmacy call with any other refills you may need.  Please keep your appointments with your specialists as you may have planned  Please return in 6 months, or sooner if needed, with Lab testing done 3-5 days before
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
## 2018-05-27 NOTE — Telephone Encounter (Signed)
This encounter was created in error - please disregard.

## 2018-05-27 NOTE — Patient Outreach (Signed)
[facility]) Care Management  05/27/2018  LAVRA IMLER 1951/06/12 [id number]    Opened in error  [place] L. Lavina Hamman, RN, BSN, CCM [facility] Telephonic Care Management Care Coordinator Direct number [phone]  Main [facility] number [phone] Fax number [phone]

## 2018-05-27 NOTE — Addendum Note (Signed)
Addended by: Barbaraann Faster on: 05/27/2018 02:30 PM   Modules accepted: Level of Service, SmartSet

## 2018-05-28 NOTE — Assessment & Plan Note (Signed)
Also for increased cymbalta, hold other tx fo rnow, declines referral for counselin

## 2018-05-28 NOTE — Assessment & Plan Note (Signed)
Mild to mod worsening, for increase cymbalta to 60 qd

## 2018-05-28 NOTE — Assessment & Plan Note (Signed)
stable overall by history and exam, recent data reviewed with pt, and pt to continue medical treatment as before,  to f/u any worsening symptoms or concerns Lab Results  Component Value Date   HGBA1C 4.9 12/22/2017

## 2018-05-30 ENCOUNTER — Telehealth: Payer: Self-pay | Admitting: Internal Medicine

## 2018-05-30 ENCOUNTER — Other Ambulatory Visit: Payer: Self-pay | Admitting: *Deleted

## 2018-05-30 NOTE — Telephone Encounter (Signed)
Verbal orders given  

## 2018-05-30 NOTE — Telephone Encounter (Signed)
Ok for verbals 

## 2018-05-30 NOTE — Telephone Encounter (Signed)
Copied from [place] [phone]. Topic: Referral - Request >> May 30, 2018 11:11 AM Robina Ade, Helene Kelp D wrote: Reason for CRM: Joelene Millin with [place] with [facility]) called and would like to have patient started on home health for skill nurse. She wants to talk to someone about this, her call back number is [phone].

## 2018-05-30 NOTE — Telephone Encounter (Signed)
Is this ok? I can give verbal orders if so.
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
## 2018-05-31 ENCOUNTER — Other Ambulatory Visit: Payer: Self-pay | Admitting: *Deleted

## 2018-05-31 NOTE — Telephone Encounter (Signed)
Caller name: Joelene Millin  Relation to pt: [facility]  Call back number:  [phone]    Reason for call:  Joelene Millin from [place] wanted to inform "[name]" please call [place] with verbal orders.

## 2018-05-31 NOTE — Patient Outreach (Signed)
[facility]) Care Management  05/31/2018  Denise King [birth year] [id number]   RED ON EMMI ALERT Day # 10 Date: 05/27/18 Red Alert Reason: feeling better overall? NO Fever or chills? Yes Had diarrhea or felt sick to stomach? Yes Swelling in hands/feet or changes in weight? Yes   Outreach attempt # 2 No answer. RN CM left HIPAA compliant voicemail message along with CM's contact info.  Today Cm is attempting to follow up on EMMI materials for fatigue,.swelling and [name] sent via e-mail and home health. Red alerts were addressed on 05/30/18  [facility] RN CM received a return call from [place] at Dr Jenny Reichmann office on 05/30/18 about initiation of home health services. Cm left a message today 05/31/18 to clarify that [name] needs to call in orders to [place] care not [facility] CM  No return calls from [facility] received since message left on 05/30/18  Plan: RN CM will follow up within 4 business days and unable to outreach letter to be sent today  Joelene Millin L. Lavina Hamman, RN, BSN, CCM [facility] Telephonic Care Management Care Coordinator Direct number [phone]  Main [facility] number [phone] Fax number [phone]

## 2018-06-03 ENCOUNTER — Other Ambulatory Visit: Payer: Self-pay | Admitting: *Deleted
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
## 2018-06-09 ENCOUNTER — Other Ambulatory Visit: Payer: Self-pay

## 2018-06-09 NOTE — Patient Outreach (Signed)
[place]) Care Management  06/09/2018  Denise King 11/10/1951 [id number]   EMMI- Pneumonia RED ON EMMI ALERT Day # 22 Date: 06/08/18 Red Alert Reason:  Smoked or been around smoke? Yes   Outreach attempt # 1 spoke with patient.  She is able to verify HIPAA. She reports she is doing better.  Discussed with patient reason for phone call. Patient reports that she is exposed to smoke.  Advised patient on the dangers of second hand smoke and possibility of having the person go to another room or outside to prevent exposure.  Also discussed with patient how smoke affects her recovery from pneumonia.  She verbalized understanding.   Discussed with patient [place].  Patient declined stating she has no needs presently.     Plan: RN CM will close case.    Jone Baseman, RN, MSN [facility] Care Management Care Management Coordinator Direct Line [phone] Toll Free: [phone]  Fax: [phone]

## 2018-06-14 ENCOUNTER — Encounter: Payer: Self-pay | Admitting: Internal Medicine

## 2018-06-18 DIAGNOSIS — J9600 Acute respiratory failure, unspecified whether with hypoxia or hypercapnia: Secondary | ICD-10-CM | POA: Diagnosis not present

## 2018-06-23 DIAGNOSIS — Z79891 Long term (current) use of opiate analgesic: Secondary | ICD-10-CM | POA: Diagnosis not present

## 2018-06-23 DIAGNOSIS — G894 Chronic pain syndrome: Secondary | ICD-10-CM | POA: Diagnosis not present

## 2018-06-23 DIAGNOSIS — M961 Postlaminectomy syndrome, not elsewhere classified: Secondary | ICD-10-CM | POA: Diagnosis not present

## 2018-06-23 DIAGNOSIS — M150 Primary generalized (osteo)arthritis: Secondary | ICD-10-CM | POA: Diagnosis not present

## 2018-06-24 ENCOUNTER — Ambulatory Visit (INDEPENDENT_AMBULATORY_CARE_PROVIDER_SITE_OTHER): Payer: Medicare HMO | Admitting: Internal Medicine

## 2018-06-24 ENCOUNTER — Encounter: Payer: Self-pay | Admitting: Internal Medicine

## 2018-06-24 VITALS — BP 110/68 | HR 110 | Temp 97.7°F | Ht 63.0 in | Wt 130.0 lb

## 2018-06-24 DIAGNOSIS — I872 Venous insufficiency (chronic) (peripheral): Secondary | ICD-10-CM

## 2018-06-24 DIAGNOSIS — M5440 Lumbago with sciatica, unspecified side: Secondary | ICD-10-CM | POA: Diagnosis not present

## 2018-06-24 DIAGNOSIS — K219 Gastro-esophageal reflux disease without esophagitis: Secondary | ICD-10-CM

## 2018-06-24 DIAGNOSIS — J189 Pneumonia, unspecified organism: Secondary | ICD-10-CM

## 2018-06-24 DIAGNOSIS — R0789 Other chest pain: Secondary | ICD-10-CM | POA: Diagnosis not present

## 2018-06-24 MED ORDER — PANTOPRAZOLE SODIUM 40 MG PO TBEC
DELAYED_RELEASE_TABLET | ORAL | 3 refills | Status: DC
Start: 2018-06-24 — End: 2019-04-07

## 2018-06-24 MED ORDER — HYDROCHLOROTHIAZIDE 12.5 MG PO CAPS: 12.5000 mg | ORAL_CAPSULE | Freq: Every day | ORAL | 3 refills | Status: DC

## 2018-06-24 MED ORDER — LIDOCAINE 5 % EX PTCH: 1.0000 | MEDICATED_PATCH | CUTANEOUS | 1 refills | Status: DC

## 2018-06-24 NOTE — Assessment & Plan Note (Signed)
Resolving, ok to follow

## 2018-06-24 NOTE — Patient Instructions (Addendum)
OK to increase the protonix to 40 mg per day  Please take all new medication as prescribed - the mild fluid pill  Please continue all other medications as before, and refills have been done if requested - the lidoderm patches  Please have the pharmacy call with any other refills you may need.  Please continue your efforts at being more active, low cholesterol diet, and weight control.  Please keep your appointments with your specialists as you may have planned

## 2018-06-24 NOTE — Assessment & Plan Note (Signed)
[place] for hct 12.5 qd prn,  to f/u any worsening symptoms or concerns, leg elevation, low salt diet, wt control and compression stockings

## 2018-06-24 NOTE — Assessment & Plan Note (Signed)
Resolved, no further tx needed 

## 2018-06-24 NOTE — Assessment & Plan Note (Signed)
[place] for lidoderm patch prn asd, f/u with pain management

## 2018-06-24 NOTE — Assessment & Plan Note (Signed)
[place] for increased PPI to bid, declines GI referral
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
## 2018-07-14 DIAGNOSIS — M545 Low back pain: Secondary | ICD-10-CM | POA: Diagnosis not present

## 2018-07-14 DIAGNOSIS — M25551 Pain in right hip: Secondary | ICD-10-CM | POA: Diagnosis not present

## 2018-07-20 DIAGNOSIS — M961 Postlaminectomy syndrome, not elsewhere classified: Secondary | ICD-10-CM | POA: Diagnosis not present

## 2018-07-20 DIAGNOSIS — Z79891 Long term (current) use of opiate analgesic: Secondary | ICD-10-CM | POA: Diagnosis not present

## 2018-07-20 DIAGNOSIS — M150 Primary generalized (osteo)arthritis: Secondary | ICD-10-CM | POA: Diagnosis not present

## 2018-07-20 DIAGNOSIS — G894 Chronic pain syndrome: Secondary | ICD-10-CM | POA: Diagnosis not present

## 2018-08-15 ENCOUNTER — Telehealth: Payer: Self-pay | Admitting: Internal Medicine

## 2018-08-15 MED ORDER — DIPHENOXYLATE-ATROPINE 2.5-0.025 MG PO TABS: 1.0000 | ORAL_TABLET | Freq: Four times a day (QID) | ORAL | 0 refills | Status: DC | PRN

## 2018-08-15 NOTE — Telephone Encounter (Signed)
I sent an Rx for Lomotil in She can use until visit

## 2018-08-15 NOTE — Telephone Encounter (Signed)
Patient reports [name] flare diarrhea for last several weeks.  She reports diarrhea multiple times a day.  She states she has tried a bland diet and imodium with little success. She has an old supply of lomotil, but that is gone now.  She denies recent antibiotics, travel, or sick contacts. She is scheduled to see Tye Savoy RNP 08/31/18 (first APP appt available). Please advise tx until ov 08/31/18

## 2018-08-16 NOTE — Telephone Encounter (Signed)
Patient notified.  She is asked to keep the appt for 08/31/18 with Tye Savoy RNP

## 2018-08-18 DIAGNOSIS — M961 Postlaminectomy syndrome, not elsewhere classified: Secondary | ICD-10-CM | POA: Diagnosis not present

## 2018-08-18 DIAGNOSIS — Z79891 Long term (current) use of opiate analgesic: Secondary | ICD-10-CM | POA: Diagnosis not present

## 2018-08-18 DIAGNOSIS — G894 Chronic pain syndrome: Secondary | ICD-10-CM | POA: Diagnosis not present

## 2018-08-18 DIAGNOSIS — M150 Primary generalized (osteo)arthritis: Secondary | ICD-10-CM | POA: Diagnosis not present

## 2018-08-22 ENCOUNTER — Telehealth: Payer: Self-pay | Admitting: Internal Medicine

## 2018-08-22 NOTE — Telephone Encounter (Signed)
Done erx 

## 2018-08-31 ENCOUNTER — Other Ambulatory Visit (INDEPENDENT_AMBULATORY_CARE_PROVIDER_SITE_OTHER): Payer: Medicare HMO

## 2018-08-31 ENCOUNTER — Ambulatory Visit: Payer: Medicare HMO | Admitting: Nurse Practitioner

## 2018-08-31 ENCOUNTER — Encounter: Payer: Self-pay | Admitting: Nurse Practitioner

## 2018-08-31 VITALS — BP 98/70 | HR 64 | Ht 63.0 in | Wt 121.2 lb

## 2018-08-31 DIAGNOSIS — D529 Folate deficiency anemia, unspecified: Secondary | ICD-10-CM | POA: Diagnosis not present

## 2018-08-31 DIAGNOSIS — R634 Abnormal weight loss: Secondary | ICD-10-CM

## 2018-08-31 DIAGNOSIS — D7589 Other specified diseases of blood and blood-forming organs: Secondary | ICD-10-CM | POA: Diagnosis not present

## 2018-08-31 DIAGNOSIS — R197 Diarrhea, unspecified: Secondary | ICD-10-CM

## 2018-08-31 LAB — FOLATE: Folate: 4.9 ng/mL — ABNORMAL LOW (ref >5.9)

## 2018-08-31 LAB — VITAMIN B12: Vitamin B-12: 298 pg/mL (ref 211–911)

## 2018-08-31 NOTE — Patient Instructions (Signed)
If you are age 65 or older, your body mass index should be between 23-30. Your Body mass index is 21.48 kg/m. If this is out of the aforementioned range listed, please consider follow up with your Primary Care Provider.  If you are age 5 or younger, your body mass index should be between 19-25. Your Body mass index is 21.48 kg/m. If this is out of the aformentioned range listed, please consider follow up with your Primary Care Provider.   Your provider has requested that you go to the basement level for lab work before leaving today. Press "B" on the elevator. The lab is located at the first door on the left as you exit the elevator.  HOLD LOMOTIL. If recurrent diarrhea, get C-Diff test done at lab.  Thank you for choosing me and [facility].   Tye Savoy, NP

## 2018-08-31 NOTE — Progress Notes (Signed)
GI Provider:  Primary GI:   Silvano Rusk, MD              Chief Complaint: diarrhea, hx of [name]      ASSESSMENT AND PLAN;   33. 67 yo female with [name]. Here for follow up on recent onset of diarrhea. Improving on Lomotil prescribed ~ 2 weeks ago. No BM today.  -Hold lomotil for now and let's see where we are with the diarrhea as she hasn't even had a BM today. . If has recurrent diarrhea then check stool for c-diff given antibiotic use in May. Seems unlikely given this far out but should check.  -she is worried about electrolytes, will check BMET  2. COPD?  Ongoing tobacco  3. Unitentional weight loss of 10 pounds since late June. I called this to her attention and advised to monitor for ongoing weight loss.   4. Macrocytosis, normal hgb. Says she rarely drinks but has a hx of B12 deficiency. Was getting B12 injections until a couple of years ago. Last B12 level as 317 in Dec 2018. -will check B12 and folate    HPI:    Patient is a 67 yo female with tobacco abuse, GERD, [place] colon cancer in first-degree relative.  In April 2017 she underwent colonoscopy with TI intubation for evaluation of diarrhea. Ileum appeared normal.  A 4 mm polyp adenoma was removed from the ascending colon.  Normal mucosa throughout the entire colon and random biopsies were normal.  Multiple left sided diverticula found in the sigmoid colon.  She is on 5-year recall list.   Patient called the office on 8/19 with an "[name] flare" . At the time she was having LLQ cramping and multiple loose, non-bloody stools a day despite bland diet and imodium.  No recent dietary or medication changes. We sent in Rx for Lomotil and she is here for follow up. At onset she was having ~ 10 loose, non-bloody stools .  No fevers. The cramping has improved and diarrhea slowly been getting better on BID Lomotil. Today she hasn't had any BMs today. She had antibiotics in May. No fevers. No blood in BM. Appetite is improving.. Feels diarrhea  was due in part to stress. She hasn't started any new meds at home. She last had antibiotics in May.     Past Medical History:  Diagnosis Date  . Acute cystitis   . Acute respiratory failure ([place])   . Allergy    as a child grew out of them  . Anemia    pernicious anemia  . Anxiety   . Arthritis   . Back pain   . Blood transfusion   . CAP (community acquired pneumonia)   . Chronic pain syndrome   . Depression   . Diverticulosis of colon   . Dysthymia   . Esophagitis   . EtOH dependence ([place])   . Gastritis   . GERD (gastroesophageal reflux disease)   . H/O chest pain Dec. 2013   no work up done  . Hx of colonic polyps   . Hypertension   . Irritable bowel syndrome   . Osteopenia   . Osteoporosis   . Pneumonia 2019  . Polypharmacy   . Reflux esophagitis   . Right sided sciatica 12/22/2017  . Tobacco use disorder   . Unspecified chronic bronchitis ([place])   . Vertigo   . Vitamin B12 deficiency   . Wears glasses      Past Surgical History:  Procedure Laterality Date  . APPENDECTOMY    . BACK SURGERY  10-09   Dr. Patrice Paradise  . CARPAL TUNNEL RELEASE Right 08/14/2014   Procedure: RIGHT CARPAL TUNNEL RELEASE AND INJECT LEFT THUMB;  Surgeon: Daryll Brod, MD;  Location: [facility];  Service: Orthopedics;  Laterality: Right;  . COLONOSCOPY    . LAPAROSCOPY N/A 02/21/2013   Procedure: LAPAROSCOPY OPERATIVE;  Surgeon: Margarette Asal, MD;  Location: [facility];  Service: Gynecology;  Laterality: N/A;  REQUESTING 5MM SCOPE WITH CAMERA  . TONSILLECTOMY    . TUBAL LIGATION    . UPPER GASTROINTESTINAL ENDOSCOPY     Family History  Problem Relation Age of Onset  . Colon cancer Father   . Prostate cancer Father   . Heart disease Mother        prev MVR, also has DJD  . Colon cancer Brother   . Skin cancer Sister   . Esophageal cancer Neg Hx   . Rectal cancer Neg Hx   . Stomach cancer Neg Hx    Social History   Tobacco Use  . Smoking status: Current Every Day  Smoker    Packs/day: 1.00    Years: 30.00    Pack years: 30.00    Types: Cigarettes  . Smokeless tobacco: Never Used  Substance Use Topics  . Alcohol use: Yes    Alcohol/week: 0.0 standard drinks    Comment: occasional  . Drug use: No   Current Outpatient Medications  Medication Sig Dispense Refill  . clonazePAM (KLONOPIN) 1 MG tablet TAKE 1 TABLET AT BEDTIME AS NEEDED FOR ANXIETY. 90 tablet 1  . diphenoxylate-atropine (LOMOTIL) 2.5-0.025 MG tablet Take 1 tablet by mouth 4 (four) times daily as needed for diarrhea or loose stools. 60 tablet 0  . DULoxetine (CYMBALTA) 60 MG capsule Take 1 capsule (60 mg total) by mouth daily. 90 capsule 3  . gabapentin (NEURONTIN) 300 MG capsule Take 300 mg by mouth 3 (three) times daily.    Marland Kitchen lidocaine (LIDODERM) 5 % Place 1 patch onto the skin daily. Remove & Discard patch within 12 hours or as directed by MD 60 patch 1  . morphine (MSIR) 15 MG tablet Take 15 mg by mouth 3 (three) times daily.    . pantoprazole (PROTONIX) 40 MG tablet TAKE 1 TABLET 30 MINUTES twice per day 180 tablet 3  . zoledronic acid (RECLAST) 5 MG/100ML SOLN Inject 5 mg into the vein as directed. Once a year...     No current facility-administered medications for this visit.    Allergies  Allergen Reactions  . Atorvastatin Nausea And Vomiting  . Levofloxacin     Reaction: achilles tendon pain  . Omnicef [Cefdinir] Diarrhea    Uncontrollable diarrhea  . Trazodone And Nefazodone     "Felt like I was going to faint"  . Sulfonamide Derivatives Rash     Review of Systems: All systems reviewed and negative except where noted in HPI.   Creatinine clearance cannot be calculated (Patient's most recent lab result is older than the maximum 21 days allowed.)   Physical Exam:    Wt Readings from Last 3 Encounters:  08/31/18 121 lb 4 oz (55 kg)  06/24/18 130 lb (59 kg)  05/27/18 131 lb (59.4 kg)    BP 98/70   Pulse 64   Ht 5\' 3"  (1.6 m)   Wt 121 lb 4 oz (55 kg)   BMI  21.48 kg/m   Constitutional:  Pleasant female in no  acute distress. Psychiatric: Normal mood and affect. Behavior is normal. EENT: Pupils normal.  Conjunctivae are normal. No scleral icterus. Neck supple.  Cardiovascular: Normal rate, regular rhythm. No edema Pulmonary/chest: Effort normal and breath sounds normal. No wheezing, rales or rhonchi. Abdominal: Soft, nondistended, nontender. Bowel sounds active throughout. There are no masses palpable. No hepatomegaly. Neurological: Alert and oriented to person place and time. Skin: Skin is warm and dry. No rashes noted.  Tye Savoy, NP  08/31/2018, 2:59 PM

## 2018-09-04 ENCOUNTER — Encounter: Payer: Self-pay | Admitting: Nurse Practitioner

## 2018-09-05 MED ORDER — FOLIC ACID 1 MG PO TABS: 1.0000 mg | ORAL_TABLET | Freq: Every day | ORAL | 3 refills | Status: DC

## 2018-09-05 NOTE — Telephone Encounter (Signed)
Ok for folic acid 1 mg daily - done erx  shirron to inform pt

## 2018-09-05 NOTE — Telephone Encounter (Signed)
-----   Message from Greggory Keen, LPN sent at 5/0/7573  4:47 PM EDT ----- Preferred communication is My Chart. Message sent. Labs routed to Dr Jenny Reichmann.

## 2018-09-15 DIAGNOSIS — Z79891 Long term (current) use of opiate analgesic: Secondary | ICD-10-CM | POA: Diagnosis not present

## 2018-09-15 DIAGNOSIS — M961 Postlaminectomy syndrome, not elsewhere classified: Secondary | ICD-10-CM | POA: Diagnosis not present

## 2018-09-15 DIAGNOSIS — G894 Chronic pain syndrome: Secondary | ICD-10-CM | POA: Diagnosis not present

## 2018-09-15 DIAGNOSIS — M150 Primary generalized (osteo)arthritis: Secondary | ICD-10-CM | POA: Diagnosis not present

## 2018-10-01 ENCOUNTER — Other Ambulatory Visit: Payer: Self-pay

## 2018-10-01 ENCOUNTER — Encounter
Admission: EM | Disposition: A | Discharge status: Skilled Nursing Facility | Payer: Self-pay | Source: Home / Self Care | Attending: Internal Medicine

## 2018-10-01 ENCOUNTER — Inpatient Hospital Stay
Admission: EM | Admit: 2018-10-01 | Discharge: 2018-10-05 | DRG: 481 | Disposition: A | Discharge status: Skilled Nursing Facility | Payer: Medicare HMO | Attending: Internal Medicine | Admitting: Internal Medicine

## 2018-10-01 ENCOUNTER — Emergency Department: Payer: Medicare HMO

## 2018-10-01 ENCOUNTER — Encounter: Payer: Self-pay | Admitting: *Deleted

## 2018-10-01 ENCOUNTER — Inpatient Hospital Stay: Payer: Medicare HMO | Admitting: Certified Registered Nurse Anesthetist

## 2018-10-01 ENCOUNTER — Inpatient Hospital Stay: Payer: Medicare HMO

## 2018-10-01 DIAGNOSIS — D751 Secondary polycythemia: Secondary | ICD-10-CM | POA: Diagnosis present

## 2018-10-01 DIAGNOSIS — S299XXA Unspecified injury of thorax, initial encounter: Secondary | ICD-10-CM | POA: Diagnosis not present

## 2018-10-01 DIAGNOSIS — Y9289 Other specified places as the place of occurrence of the external cause: Secondary | ICD-10-CM

## 2018-10-01 DIAGNOSIS — Z8249 Family history of ischemic heart disease and other diseases of the circulatory system: Secondary | ICD-10-CM

## 2018-10-01 DIAGNOSIS — S72001A Fracture of unspecified part of neck of right femur, initial encounter for closed fracture: Secondary | ICD-10-CM

## 2018-10-01 DIAGNOSIS — K5909 Other constipation: Secondary | ICD-10-CM | POA: Diagnosis not present

## 2018-10-01 DIAGNOSIS — S72009A Fracture of unspecified part of neck of unspecified femur, initial encounter for closed fracture: Secondary | ICD-10-CM | POA: Diagnosis present

## 2018-10-01 DIAGNOSIS — S7291XA Unspecified fracture of right femur, initial encounter for closed fracture: Secondary | ICD-10-CM | POA: Diagnosis not present

## 2018-10-01 DIAGNOSIS — Z23 Encounter for immunization: Secondary | ICD-10-CM | POA: Diagnosis not present

## 2018-10-01 DIAGNOSIS — E876 Hypokalemia: Secondary | ICD-10-CM | POA: Diagnosis present

## 2018-10-01 DIAGNOSIS — R718 Other abnormality of red blood cells: Secondary | ICD-10-CM

## 2018-10-01 DIAGNOSIS — W010XXA Fall on same level from slipping, tripping and stumbling without subsequent striking against object, initial encounter: Secondary | ICD-10-CM | POA: Diagnosis present

## 2018-10-01 DIAGNOSIS — F1120 Opioid dependence, uncomplicated: Secondary | ICD-10-CM | POA: Diagnosis present

## 2018-10-01 DIAGNOSIS — I10 Essential (primary) hypertension: Secondary | ICD-10-CM | POA: Diagnosis present

## 2018-10-01 DIAGNOSIS — F10229 Alcohol dependence with intoxication, unspecified: Secondary | ICD-10-CM | POA: Diagnosis present

## 2018-10-01 DIAGNOSIS — R0902 Hypoxemia: Secondary | ICD-10-CM | POA: Diagnosis not present

## 2018-10-01 DIAGNOSIS — S72141A Displaced intertrochanteric fracture of right femur, initial encounter for closed fracture: Secondary | ICD-10-CM | POA: Diagnosis not present

## 2018-10-01 DIAGNOSIS — Z720 Tobacco use: Secondary | ICD-10-CM | POA: Diagnosis not present

## 2018-10-01 DIAGNOSIS — F17210 Nicotine dependence, cigarettes, uncomplicated: Secondary | ICD-10-CM | POA: Diagnosis present

## 2018-10-01 DIAGNOSIS — K589 Irritable bowel syndrome without diarrhea: Secondary | ICD-10-CM | POA: Diagnosis not present

## 2018-10-01 DIAGNOSIS — K219 Gastro-esophageal reflux disease without esophagitis: Secondary | ICD-10-CM | POA: Diagnosis present

## 2018-10-01 DIAGNOSIS — G894 Chronic pain syndrome: Secondary | ICD-10-CM | POA: Diagnosis present

## 2018-10-01 DIAGNOSIS — R52 Pain, unspecified: Secondary | ICD-10-CM | POA: Diagnosis not present

## 2018-10-01 DIAGNOSIS — Z7401 Bed confinement status: Secondary | ICD-10-CM | POA: Diagnosis not present

## 2018-10-01 DIAGNOSIS — M25551 Pain in right hip: Secondary | ICD-10-CM | POA: Diagnosis not present

## 2018-10-01 DIAGNOSIS — S72141D Displaced intertrochanteric fracture of right femur, subsequent encounter for closed fracture with routine healing: Secondary | ICD-10-CM | POA: Diagnosis not present

## 2018-10-01 DIAGNOSIS — R69 Illness, unspecified: Secondary | ICD-10-CM | POA: Diagnosis not present

## 2018-10-01 DIAGNOSIS — R000 Tachycardia, unspecified: Secondary | ICD-10-CM | POA: Diagnosis not present

## 2018-10-01 DIAGNOSIS — K210 Gastro-esophageal reflux disease with esophagitis: Secondary | ICD-10-CM | POA: Diagnosis not present

## 2018-10-01 DIAGNOSIS — M810 Age-related osteoporosis without current pathological fracture: Secondary | ICD-10-CM | POA: Diagnosis not present

## 2018-10-01 DIAGNOSIS — M6281 Muscle weakness (generalized): Secondary | ICD-10-CM | POA: Diagnosis not present

## 2018-10-01 DIAGNOSIS — F10129 Alcohol abuse with intoxication, unspecified: Secondary | ICD-10-CM

## 2018-10-01 HISTORY — PX: INTRAMEDULLARY (IM) NAIL INTERTROCHANTERIC: SHX5875

## 2018-10-01 LAB — BASIC METABOLIC PANEL
Anion gap: 17 — ABNORMAL HIGH (ref 5–15)
BUN: 8 mg/dL (ref 8–23)
CO2: 30 mmol/L (ref 22–32)
Calcium: 9 mg/dL (ref 8.9–10.3)
Chloride: 84 mmol/L — ABNORMAL LOW (ref 98–111)
Creatinine, Ser: 0.58 mg/dL (ref 0.44–1.00)
GFR calc Af Amer: >60 mL/min (ref >60)
GFR calc non Af Amer: >60 mL/min (ref >60)
Glucose, Bld: 89 mg/dL (ref 70–99)
Potassium: 2.8 mmol/L — ABNORMAL LOW (ref 3.5–5.1)
Sodium: 131 mmol/L — ABNORMAL LOW (ref 135–145)

## 2018-10-01 LAB — CBC WITH DIFFERENTIAL/PLATELET
Basophils Absolute: 0.1 10*3/uL (ref 0–0.1)
Basophils Relative: 1 %
Eosinophils Absolute: 0.1 10*3/uL (ref 0–0.7)
Eosinophils Relative: 1 %
HCT: 61.3 % — ABNORMAL HIGH (ref 35.0–47.0)
Hemoglobin: 20.7 g/dL — ABNORMAL HIGH (ref 12.0–16.0)
Lymphocytes Relative: 51 %
Lymphs Abs: 3.6 10*3/uL (ref 1.0–3.6)
MCH: 34.7 pg — ABNORMAL HIGH (ref 26.0–34.0)
MCHC: 33.8 g/dL (ref 32.0–36.0)
MCV: 102.5 fL — ABNORMAL HIGH (ref 80.0–100.0)
Monocytes Absolute: 0.7 10*3/uL (ref 0.2–0.9)
Monocytes Relative: 9 %
Neutro Abs: 2.8 10*3/uL (ref 1.4–6.5)
Neutrophils Relative %: 38 %
Platelets: 212 10*3/uL (ref 150–440)
RBC: 5.98 MIL/uL — ABNORMAL HIGH (ref 3.80–5.20)
RDW: 16.8 % — ABNORMAL HIGH (ref 11.5–14.5)
WBC: 7.2 10*3/uL (ref 3.6–11.0)

## 2018-10-01 LAB — MAGNESIUM: Magnesium: 2.1 mg/dL (ref 1.7–2.4)

## 2018-10-01 LAB — TYPE AND SCREEN
ABO/RH(D): A POS
Antibody Screen: NEGATIVE

## 2018-10-01 LAB — TSH: TSH: 1.108 u[IU]/mL (ref 0.350–4.500)

## 2018-10-01 LAB — URINALYSIS, ROUTINE W REFLEX MICROSCOPIC
Bacteria, UA: NONE SEEN
Bilirubin Urine: NEGATIVE
Glucose, UA: NEGATIVE mg/dL
Ketones, ur: NEGATIVE mg/dL
Leukocytes, UA: NEGATIVE
Nitrite: NEGATIVE
Protein, ur: NEGATIVE mg/dL
Specific Gravity, Urine: 1.005 (ref 1.005–1.030)
pH: 5 (ref 5.0–8.0)

## 2018-10-01 LAB — ETHANOL: Alcohol, Ethyl (B): 109 mg/dL — ABNORMAL HIGH (ref <10)

## 2018-10-01 LAB — POTASSIUM: Potassium: 3.5 mmol/L (ref 3.5–5.1)

## 2018-10-01 LAB — PROTIME-INR
INR: 0.88
Prothrombin Time: 11.9 seconds (ref 11.4–15.2)

## 2018-10-01 LAB — SURGICAL PCR SCREEN
MRSA, PCR: NEGATIVE
Staphylococcus aureus: NEGATIVE

## 2018-10-01 SURGERY — FIXATION, FRACTURE, INTERTROCHANTERIC, WITH INTRAMEDULLARY ROD
Anesthesia: General | Laterality: Right

## 2018-10-01 MED ORDER — OXYCODONE HCL 5 MG PO TABS
10.0000 mg | ORAL_TABLET | ORAL | Status: DC | PRN
  Administered 2018-10-01 – 2018-10-03 (×4): 10 mg via ORAL
  Filled 2018-10-01: qty 3

## 2018-10-01 MED ORDER — POTASSIUM CHLORIDE IN NACL 20-0.9 MEQ/L-% IV SOLN
INTRAVENOUS | Status: DC
  Administered 2018-10-01: 19:00:00 via INTRAVENOUS
  Filled 2018-10-01 (×11): qty 1000

## 2018-10-01 MED ORDER — SUCCINYLCHOLINE CHLORIDE 20 MG/ML IJ SOLN
INTRAMUSCULAR | Status: DC | PRN
  Administered 2018-10-01: 80 mg via INTRAVENOUS

## 2018-10-01 MED ORDER — PANTOPRAZOLE SODIUM 40 MG PO TBEC
40.0000 mg | DELAYED_RELEASE_TABLET | Freq: Two times a day (BID) | ORAL | Status: DC
  Administered 2018-10-01 – 2018-10-05 (×8): 40 mg via ORAL
  Filled 2018-10-01 (×9): qty 1

## 2018-10-01 MED ORDER — ONDANSETRON HCL 4 MG/2ML IJ SOLN
INTRAMUSCULAR | Status: DC | PRN
  Administered 2018-10-01: 4 mg via INTRAVENOUS

## 2018-10-01 MED ORDER — VITAMIN B-1 100 MG PO TABS
100.0000 mg | ORAL_TABLET | Freq: Every day | ORAL | Status: DC
  Administered 2018-10-02 – 2018-10-05 (×4): 100 mg via ORAL
  Filled 2018-10-01 (×4): qty 1

## 2018-10-01 MED ORDER — FENTANYL CITRATE (PF) 100 MCG/2ML IJ SOLN
INTRAMUSCULAR | Status: AC
  Filled 2018-10-01: qty 2

## 2018-10-01 MED ORDER — VANCOMYCIN HCL IN DEXTROSE 1-5 GM/200ML-% IV SOLN
1000.0000 mg | Freq: Two times a day (BID) | INTRAVENOUS | Status: AC
  Administered 2018-10-02: 1000 mg via INTRAVENOUS
  Filled 2018-10-01: qty 200

## 2018-10-01 MED ORDER — MORPHINE SULFATE (PF) 2 MG/ML IV SOLN
2.0000 mg | INTRAVENOUS | Status: DC | PRN
  Administered 2018-10-01 (×2): 2 mg via INTRAVENOUS
  Filled 2018-10-01: qty 1

## 2018-10-01 MED ORDER — ACETAMINOPHEN 325 MG PO TABS
325.0000 mg | ORAL_TABLET | Freq: Four times a day (QID) | ORAL | Status: DC | PRN
  Administered 2018-10-04: 325 mg via ORAL
  Filled 2018-10-01 (×2): qty 2

## 2018-10-01 MED ORDER — OXYCODONE HCL 5 MG PO TABS
5.0000 mg | ORAL_TABLET | Freq: Four times a day (QID) | ORAL | Status: DC | PRN
  Administered 2018-10-01: 5 mg via ORAL
  Filled 2018-10-01: qty 1

## 2018-10-01 MED ORDER — ACETAMINOPHEN 325 MG PO TABS: 650.0000 mg | ORAL_TABLET | Freq: Four times a day (QID) | ORAL | Status: DC | PRN

## 2018-10-01 MED ORDER — DIPHENOXYLATE-ATROPINE 2.5-0.025 MG PO TABS: 1.0000 | ORAL_TABLET | Freq: Four times a day (QID) | ORAL | Status: DC | PRN

## 2018-10-01 MED ORDER — ACETAMINOPHEN 500 MG PO TABS
1000.0000 mg | ORAL_TABLET | Freq: Four times a day (QID) | ORAL | Status: AC
  Administered 2018-10-02 (×2): 1000 mg via ORAL
  Filled 2018-10-01 (×2): qty 2

## 2018-10-01 MED ORDER — KETOROLAC TROMETHAMINE 15 MG/ML IJ SOLN
7.5000 mg | Freq: Four times a day (QID) | INTRAMUSCULAR | Status: AC
  Administered 2018-10-01 – 2018-10-02 (×3): 7.5 mg via INTRAVENOUS
  Filled 2018-10-01 (×3): qty 1

## 2018-10-01 MED ORDER — ONDANSETRON HCL 4 MG/2ML IJ SOLN: 4.0000 mg | Freq: Four times a day (QID) | INTRAMUSCULAR | Status: DC | PRN

## 2018-10-01 MED ORDER — PROPOFOL 10 MG/ML IV BOLUS
INTRAVENOUS | Status: AC
  Filled 2018-10-01: qty 20

## 2018-10-01 MED ORDER — POTASSIUM CHLORIDE 10 MEQ/100ML IV SOLN
10.0000 meq | INTRAVENOUS | Status: AC
  Administered 2018-10-01 (×4): 10 meq via INTRAVENOUS
  Filled 2018-10-01 (×4): qty 100

## 2018-10-01 MED ORDER — OXYCODONE HCL 5 MG PO TABS: 15.0000 mg | ORAL_TABLET | Freq: Once | ORAL | Status: DC | PRN

## 2018-10-01 MED ORDER — NEOMYCIN-POLYMYXIN B GU 40-200000 IR SOLN
Status: AC
  Filled 2018-10-01: qty 4

## 2018-10-01 MED ORDER — METOCLOPRAMIDE HCL 5 MG/ML IJ SOLN: 5.0000 mg | Freq: Three times a day (TID) | INTRAMUSCULAR | Status: DC | PRN

## 2018-10-01 MED ORDER — HYDROMORPHONE HCL 1 MG/ML IJ SOLN
INTRAMUSCULAR | Status: AC
  Filled 2018-10-01: qty 1

## 2018-10-01 MED ORDER — ENOXAPARIN SODIUM 40 MG/0.4ML ~~LOC~~ SOLN
40.0000 mg | SUBCUTANEOUS | Status: DC
  Administered 2018-10-02 – 2018-10-05 (×4): 40 mg via SUBCUTANEOUS
  Filled 2018-10-01 (×4): qty 0.4

## 2018-10-01 MED ORDER — GABAPENTIN 300 MG PO CAPS
300.0000 mg | ORAL_CAPSULE | Freq: Three times a day (TID) | ORAL | Status: DC
  Administered 2018-10-01 – 2018-10-05 (×10): 300 mg via ORAL
  Filled 2018-10-01 (×11): qty 1

## 2018-10-01 MED ORDER — LIDOCAINE 5 % EX PTCH
1.0000 | MEDICATED_PATCH | CUTANEOUS | Status: DC
  Administered 2018-10-01 – 2018-10-04 (×4): 1 via TRANSDERMAL
  Filled 2018-10-01 (×5): qty 1

## 2018-10-01 MED ORDER — KETAMINE HCL 10 MG/ML IJ SOLN
INTRAMUSCULAR | Status: DC | PRN
  Administered 2018-10-01: 50 mg via INTRAVENOUS
  Administered 2018-10-01: 25 mg via INTRAVENOUS

## 2018-10-01 MED ORDER — THIAMINE HCL 100 MG/ML IJ SOLN: 100.0000 mg | Freq: Every day | INTRAMUSCULAR | Status: DC

## 2018-10-01 MED ORDER — ONDANSETRON HCL 4 MG PO TABS: 4.0000 mg | ORAL_TABLET | Freq: Four times a day (QID) | ORAL | Status: DC | PRN

## 2018-10-01 MED ORDER — CLONAZEPAM 1 MG PO TABS
1.0000 mg | ORAL_TABLET | Freq: Every evening | ORAL | Status: DC | PRN
  Administered 2018-10-03 – 2018-10-04 (×2): 1 mg via ORAL
  Filled 2018-10-01 (×2): qty 1

## 2018-10-01 MED ORDER — ACETAMINOPHEN 650 MG RE SUPP: 650.0000 mg | Freq: Four times a day (QID) | RECTAL | Status: DC | PRN

## 2018-10-01 MED ORDER — MAGNESIUM CITRATE PO SOLN
1.0000 | Freq: Once | ORAL | Status: DC | PRN
  Filled 2018-10-01: qty 296

## 2018-10-01 MED ORDER — ADULT MULTIVITAMIN W/MINERALS CH
1.0000 | ORAL_TABLET | Freq: Every day | ORAL | Status: DC
  Administered 2018-10-02 – 2018-10-05 (×3): 1 via ORAL
  Filled 2018-10-01 (×4): qty 1

## 2018-10-01 MED ORDER — MORPHINE SULFATE (PF) 2 MG/ML IV SOLN
INTRAVENOUS | Status: AC
  Administered 2018-10-01: 2 mg via INTRAVENOUS
  Filled 2018-10-01: qty 1

## 2018-10-01 MED ORDER — MENTHOL 3 MG MT LOZG
1.0000 | LOZENGE | OROMUCOSAL | Status: DC | PRN
  Filled 2018-10-01: qty 9

## 2018-10-01 MED ORDER — DULOXETINE HCL 60 MG PO CPEP
60.0000 mg | ORAL_CAPSULE | Freq: Every day | ORAL | Status: DC
  Administered 2018-10-02 – 2018-10-04 (×3): 60 mg via ORAL
  Filled 2018-10-01 (×4): qty 1

## 2018-10-01 MED ORDER — FENTANYL CITRATE (PF) 100 MCG/2ML IJ SOLN
INTRAMUSCULAR | Status: DC | PRN
  Administered 2018-10-01 (×8): 50 ug via INTRAVENOUS

## 2018-10-01 MED ORDER — OXYCODONE HCL 5 MG PO TABS
5.0000 mg | ORAL_TABLET | ORAL | Status: DC | PRN
  Administered 2018-10-02 – 2018-10-03 (×4): 10 mg via ORAL
  Filled 2018-10-01 (×8): qty 2

## 2018-10-01 MED ORDER — PROMETHAZINE HCL 25 MG/ML IJ SOLN: 6.2500 mg | INTRAMUSCULAR | Status: DC | PRN

## 2018-10-01 MED ORDER — ACETAMINOPHEN 10 MG/ML IV SOLN
INTRAVENOUS | Status: AC
  Filled 2018-10-01: qty 100

## 2018-10-01 MED ORDER — LIDOCAINE HCL (CARDIAC) PF 100 MG/5ML IV SOSY
PREFILLED_SYRINGE | INTRAVENOUS | Status: DC | PRN
  Administered 2018-10-01: 80 mg via INTRAVENOUS

## 2018-10-01 MED ORDER — FERROUS SULFATE 325 (65 FE) MG PO TABS
325.0000 mg | ORAL_TABLET | Freq: Three times a day (TID) | ORAL | Status: DC
  Administered 2018-10-02 – 2018-10-04 (×8): 325 mg via ORAL
  Filled 2018-10-01 (×10): qty 1

## 2018-10-01 MED ORDER — PROPOFOL 10 MG/ML IV BOLUS
INTRAVENOUS | Status: DC | PRN
  Administered 2018-10-01: 120 mg via INTRAVENOUS

## 2018-10-01 MED ORDER — DEXAMETHASONE SODIUM PHOSPHATE 10 MG/ML IJ SOLN
INTRAMUSCULAR | Status: DC | PRN
  Administered 2018-10-01: 5 mg via INTRAVENOUS

## 2018-10-01 MED ORDER — POTASSIUM CHLORIDE IN NACL 40-0.9 MEQ/L-% IV SOLN
INTRAVENOUS | Status: DC
  Administered 2018-10-01: 100 mL/h via INTRAVENOUS
  Filled 2018-10-01 (×4): qty 1000

## 2018-10-01 MED ORDER — METOCLOPRAMIDE HCL 10 MG PO TABS: 5.0000 mg | ORAL_TABLET | Freq: Three times a day (TID) | ORAL | Status: DC | PRN

## 2018-10-01 MED ORDER — KETAMINE HCL 10 MG/ML IJ SOLN: 0.1500 mg/kg | Freq: Once | INTRAMUSCULAR | Status: DC

## 2018-10-01 MED ORDER — LORAZEPAM 2 MG/ML IJ SOLN: 1.0000 mg | Freq: Four times a day (QID) | INTRAMUSCULAR | Status: AC | PRN

## 2018-10-01 MED ORDER — TRAMADOL HCL 50 MG PO TABS
50.0000 mg | ORAL_TABLET | Freq: Four times a day (QID) | ORAL | Status: DC
  Administered 2018-10-02 – 2018-10-05 (×11): 50 mg via ORAL
  Filled 2018-10-01 (×13): qty 1

## 2018-10-01 MED ORDER — FOLIC ACID 1 MG PO TABS
1.0000 mg | ORAL_TABLET | Freq: Every day | ORAL | Status: DC
  Administered 2018-10-02 – 2018-10-05 (×4): 1 mg via ORAL
  Filled 2018-10-01 (×4): qty 1

## 2018-10-01 MED ORDER — KETAMINE HCL 10 MG/ML IJ SOLN
0.3000 mg/kg | Freq: Once | INTRAMUSCULAR | Status: AC
  Administered 2018-10-01: 16 mg via INTRAVENOUS

## 2018-10-01 MED ORDER — PHENOL 1.4 % MT LIQD
1.0000 | OROMUCOSAL | Status: DC | PRN
  Filled 2018-10-01: qty 177

## 2018-10-01 MED ORDER — GABAPENTIN 300 MG PO CAPS
ORAL_CAPSULE | ORAL | Status: AC
  Filled 2018-10-01: qty 2

## 2018-10-01 MED ORDER — ACETAMINOPHEN 10 MG/ML IV SOLN
INTRAVENOUS | Status: DC | PRN
  Administered 2018-10-01: 1000 mg via INTRAVENOUS

## 2018-10-01 MED ORDER — HYDROMORPHONE HCL 1 MG/ML IJ SOLN
0.5000 mg | INTRAMUSCULAR | Status: DC | PRN
  Administered 2018-10-01 (×5): 0.5 mg via INTRAVENOUS
  Filled 2018-10-01: qty 1

## 2018-10-01 MED ORDER — DOCUSATE SODIUM 100 MG PO CAPS
100.0000 mg | ORAL_CAPSULE | Freq: Two times a day (BID) | ORAL | Status: DC
  Administered 2018-10-01 – 2018-10-05 (×6): 100 mg via ORAL
  Filled 2018-10-01 (×8): qty 1

## 2018-10-01 MED ORDER — ALUM & MAG HYDROXIDE-SIMETH 200-200-20 MG/5ML PO SUSP
30.0000 mL | ORAL | Status: DC | PRN
  Administered 2018-10-02: 30 mL via ORAL
  Filled 2018-10-01: qty 30

## 2018-10-01 MED ORDER — KETAMINE HCL 10 MG/ML IJ SOLN
10.0000 mg | Freq: Once | INTRAMUSCULAR | Status: AC
  Administered 2018-10-01: 10 mg via INTRAVENOUS

## 2018-10-01 MED ORDER — POLYETHYLENE GLYCOL 3350 17 G PO PACK: 17.0000 g | PACK | Freq: Every day | ORAL | Status: DC | PRN

## 2018-10-01 MED ORDER — MIDAZOLAM HCL 2 MG/2ML IJ SOLN
INTRAMUSCULAR | Status: DC | PRN
  Administered 2018-10-01: 2 mg via INTRAVENOUS

## 2018-10-01 MED ORDER — METHOCARBAMOL 1000 MG/10ML IJ SOLN
500.0000 mg | Freq: Four times a day (QID) | INTRAVENOUS | Status: DC | PRN
  Filled 2018-10-01: qty 5

## 2018-10-01 MED ORDER — METHOCARBAMOL 500 MG PO TABS
500.0000 mg | ORAL_TABLET | Freq: Four times a day (QID) | ORAL | Status: DC | PRN
  Administered 2018-10-03 – 2018-10-05 (×7): 500 mg via ORAL
  Filled 2018-10-01 (×8): qty 1

## 2018-10-01 MED ORDER — LORAZEPAM 2 MG/ML IJ SOLN: 0.0000 mg | Freq: Two times a day (BID) | INTRAMUSCULAR | Status: AC

## 2018-10-01 MED ORDER — LORAZEPAM 2 MG/ML IJ SOLN: 0.0000 mg | Freq: Four times a day (QID) | INTRAMUSCULAR | Status: AC

## 2018-10-01 MED ORDER — BISACODYL 10 MG RE SUPP: 10.0000 mg | Freq: Every day | RECTAL | Status: DC | PRN

## 2018-10-01 MED ORDER — VANCOMYCIN HCL IN DEXTROSE 1-5 GM/200ML-% IV SOLN
1000.0000 mg | Freq: Once | INTRAVENOUS | Status: AC
  Administered 2018-10-01: 1000 mg via INTRAVENOUS
  Filled 2018-10-01: qty 200

## 2018-10-01 MED ORDER — KETAMINE HCL 50 MG/ML IJ SOLN
INTRAMUSCULAR | Status: AC
  Filled 2018-10-01: qty 10

## 2018-10-01 MED ORDER — LACTATED RINGERS IV SOLN
INTRAVENOUS | Status: DC | PRN
  Administered 2018-10-01: 13:00:00 via INTRAVENOUS

## 2018-10-01 MED ORDER — KETAMINE HCL 10 MG/ML IJ SOLN
INTRAMUSCULAR | Status: AC
  Filled 2018-10-01: qty 1

## 2018-10-01 MED ORDER — MIDAZOLAM HCL 2 MG/2ML IJ SOLN
INTRAMUSCULAR | Status: AC
  Filled 2018-10-01: qty 2

## 2018-10-01 MED ORDER — SODIUM CHLORIDE 0.9 % IV BOLUS
1000.0000 mL | Freq: Once | INTRAVENOUS | Status: AC
  Administered 2018-10-01: 1000 mL via INTRAVENOUS

## 2018-10-01 MED ORDER — SODIUM CHLORIDE 0.9 % IV SOLN
INTRAVENOUS | Status: DC
  Administered 2018-10-01: 04:00:00 via INTRAVENOUS

## 2018-10-01 MED ORDER — DOCUSATE SODIUM 100 MG PO CAPS: 100.0000 mg | ORAL_CAPSULE | Freq: Two times a day (BID) | ORAL | Status: DC

## 2018-10-01 MED ORDER — GABAPENTIN 300 MG PO CAPS: 600.0000 mg | ORAL_CAPSULE | Freq: Once | ORAL | Status: DC | PRN

## 2018-10-01 MED ORDER — LORAZEPAM 1 MG PO TABS: 1.0000 mg | ORAL_TABLET | Freq: Four times a day (QID) | ORAL | Status: AC | PRN

## 2018-10-01 MED ORDER — HYDROMORPHONE HCL 1 MG/ML IJ SOLN: 0.5000 mg | INTRAMUSCULAR | Status: DC | PRN

## 2018-10-01 MED ORDER — HYDROMORPHONE HCL 1 MG/ML IJ SOLN
1.0000 mg | INTRAMUSCULAR | Status: DC | PRN
  Administered 2018-10-01: 1 mg via INTRAVENOUS

## 2018-10-01 MED ORDER — MORPHINE SULFATE ER 30 MG PO TBCR
30.0000 mg | EXTENDED_RELEASE_TABLET | Freq: Two times a day (BID) | ORAL | Status: DC
  Filled 2018-10-01: qty 1

## 2018-10-01 MED ORDER — FOLIC ACID 1 MG PO TABS: 1.0000 mg | ORAL_TABLET | Freq: Every day | ORAL | Status: DC

## 2018-10-01 SURGICAL SUPPLY — 35 items
BIT DRILL CANN LG 4.3MM (BIT) IMPLANT
BNDG COHESIVE 6X5 TAN STRL LF (GAUZE/BANDAGES/DRESSINGS) ×4 IMPLANT
CANISTER SUCT 1200ML W/VALVE (MISCELLANEOUS) ×2 IMPLANT
DRAPE SHEET LG 3/4 BI-LAMINATE (DRAPES) ×4 IMPLANT
DRAPE SURG 17X11 SM STRL (DRAPES) ×4 IMPLANT
DRAPE U-SHAPE 47X51 STRL (DRAPES) ×2 IMPLANT
DRILL BIT CANN LG 4.3MM (BIT) ×2
DRSG OPSITE POSTOP 4X14 (GAUZE/BANDAGES/DRESSINGS) ×2 IMPLANT
DURAPREP 26ML APPLICATOR (WOUND CARE) ×4 IMPLANT
ELECT REM PT RETURN 9FT ADLT (ELECTROSURGICAL) ×2
ELECTRODE REM PT RTRN 9FT ADLT (ELECTROSURGICAL) ×1 IMPLANT
GLOVE BIOGEL PI IND STRL 9 (GLOVE) ×1 IMPLANT
GLOVE BIOGEL PI INDICATOR 9 (GLOVE) ×1
GLOVE SURG 9.0 ORTHO LTXF (GLOVE) ×4 IMPLANT
GOWN STRL REUS TWL 2XL XL LVL4 (GOWN DISPOSABLE) ×2 IMPLANT
GOWN STRL REUS W/ TWL LRG LVL3 (GOWN DISPOSABLE) ×1 IMPLANT
GOWN STRL REUS W/TWL LRG LVL3 (GOWN DISPOSABLE) ×2
GUIDEPIN VERSANAIL DSP 3.2X444 (ORTHOPEDIC DISPOSABLE SUPPLIES) ×2 IMPLANT
HEMOVAC 400CC 10FR (MISCELLANEOUS) ×2 IMPLANT
KIT TURNOVER CYSTO (KITS) ×2 IMPLANT
MAT ABSORB  FLUID 56X50 GRAY (MISCELLANEOUS) ×1
MAT ABSORB FLUID 56X50 GRAY (MISCELLANEOUS) ×1 IMPLANT
NAIL HIP FRACT 130D 11X180 (Screw) ×1 IMPLANT
NS IRRIG 1000ML POUR BTL (IV SOLUTION) ×2 IMPLANT
PACK HIP COMPR (MISCELLANEOUS) ×2 IMPLANT
SCREW BONE CORTICAL 5.0X32 (Screw) ×1 IMPLANT
SCREW LAG HIP NAIL 10.5X95 (Screw) ×1 IMPLANT
STAPLER SKIN PROX 35W (STAPLE) ×2 IMPLANT
SUCTION FRAZIER HANDLE 10FR (MISCELLANEOUS) ×1
SUCTION TUBE FRAZIER 10FR DISP (MISCELLANEOUS) ×1 IMPLANT
SUT VIC AB 0 CT1 36 (SUTURE) ×4 IMPLANT
SUT VIC AB 2-0 CT1 27 (SUTURE) ×2
SUT VIC AB 2-0 CT1 TAPERPNT 27 (SUTURE) ×1 IMPLANT
SUT VICRYL 0 AB UR-6 (SUTURE) ×2 IMPLANT
SYR 30ML LL (SYRINGE) ×2 IMPLANT

## 2018-10-01 NOTE — Anesthesia Procedure Notes (Signed)
Procedure Name: Intubation Performed by: Demetrius Charity, CRNA Pre-anesthesia Checklist: Patient identified, Patient being monitored, Timeout performed, Emergency Drugs available and Suction available Patient Re-evaluated:Patient Re-evaluated prior to induction Oxygen Delivery Method: Circle system utilized Preoxygenation: Pre-oxygenation with 100% oxygen Induction Type: IV induction Ventilation: Mask ventilation without difficulty Laryngoscope Size: Mac and 3 Grade View: Grade I Tube type: Oral Tube size: 7.0 mm Number of attempts: 1 Airway Equipment and Method: Stylet Placement Confirmation: ETT inserted through vocal cords under direct vision,  positive ETCO2 and breath sounds checked- equal and bilateral Secured at: 21 cm Tube secured with: Tape Dental Injury: Teeth and Oropharynx as per pre-operative assessment

## 2018-10-01 NOTE — ED Triage Notes (Signed)
Pt presents post fall at home x 1.5 hrs ago. Pt c/o R hip pain. Pt has had 250 mg fentanyl per EMS. Pt is a chronic user of narcotic pain meds for her back pain. Pt has has 2+ palpable pulses distal to injury. Pt is having difficulty tolerating pain and is difficult to position for good visualization of RLE

## 2018-10-01 NOTE — Progress Notes (Signed)
Spoke with Dr. Estanislado Pandy pt drowsy after receiving oxycodone and has 30mg  of extended release morphine ordered. Per MD may hold morphine dose tonight.

## 2018-10-01 NOTE — ED Provider Notes (Signed)
[facility] Emergency Department Provider Note  ____________________________________________   First MD Initiated Contact with Patient 10/01/18 [phone]     (approximate)  I have reviewed the triage vital signs and the nursing notes.   HISTORY  Chief Complaint Hip Pain  Level 5 caveat:  history/ROS limited by acute intoxication and severe pain  HPI Denise King is a 67 y.o. female with medical history as listed below which includes chronic pain syndrome and narcotic dependence.  She presents by EMS for evaluation after a fall which resulted in acute onset severe pain in her right hip.  The pain is constant and worse with any movement of her right leg.  Nothing makes it better.  She reports that she was walking in her greenhouse in the middle of the night and is unclear on why she was doing that.  She reports that she must have slipped and fallen.  She does not believe she hit her head and she denies headache and neck pain.  She has no chest pain or shortness of breath.  She has a hard time concentrating on anything else because of the severe pain in her hip.  She is not on any anticoagulation.  She reports having 1 drink earlier tonight.  Past Medical History:  Diagnosis Date  . Acute cystitis   . Acute respiratory failure ([place])   . Allergy    as a child grew out of them  . Anemia    pernicious anemia  . Anxiety   . Arthritis   . Back pain   . Blood transfusion   . CAP (community acquired pneumonia)   . Chronic pain syndrome   . Depression   . Diverticulosis of colon   . Dysthymia   . Esophagitis   . EtOH dependence ([place])   . Gastritis   . GERD (gastroesophageal reflux disease)   . H/O chest pain Dec. 2013   no work up done  . Hx of colonic polyps   . Hypertension   . Irritable bowel syndrome   . Osteopenia   . Osteoporosis   . Pneumonia 2019  . Polypharmacy   . Reflux esophagitis   . Right sided sciatica 12/22/2017  . Tobacco use  disorder   . Unspecified chronic bronchitis ([place])   . Vertigo   . Vitamin B12 deficiency   . Wears glasses     Patient Active Problem List   Diagnosis Date Noted  . Hip fracture ([place]) 10/01/2018  . Dysuria 05/20/2018  . Right leg swelling 05/20/2018  . Acute respiratory failure ([place]) 05/13/2018  . CAP (community acquired pneumonia) 05/13/2018  . Tachycardia 05/13/2018  . Chest wall pain 05/13/2018  . Narcotic poisoning ([place]) 05/13/2018  . EtOH dependence ([place])   . Polypharmacy   . Esophagitis   . Encounter for well adult exam with abnormal findings 12/22/2017  . Polycythemia 12/22/2017  . Depression 12/22/2017  . Hyperglycemia 12/22/2017  . Right sided sciatica 12/22/2017  . Nausea & vomiting 07/17/2017  . Cough 06/10/2017  . Diarrhea 04/22/2016  . Family history of colon cancer - brother and father 03/10/2016  . Insomnia 02/11/2016  . Generalized anxiety disorder 11/25/2015  . Urinary frequency 04/25/2015  . Nausea and vomiting in adult 11/08/2014  . Cigarette smoker 08/15/2013  . Otitis media 10/27/2012  . Abdominal pain, generalized 03/30/2012  . HEPATIC CYST 02/18/2010  . Chronic pain syndrome 02/08/2010  . Vitamin B12 deficiency 10/04/2009  . GERD 01/04/2009  . ABDOMINAL PAIN-EPIGASTRIC  01/04/2009  . DYSTHYMIA 08/10/2008  . Venous (peripheral) insufficiency 08/10/2008  . Irritable bowel syndrome 08/10/2008  . Back pain 08/10/2008  . CIGARETTE SMOKER 03/06/2008  . Hx of adenomatous polyp of colon 12/06/2007  . BRONCHITIS, RECURRENT 12/06/2007  . DIVERTICULOSIS OF COLON 12/06/2007  . OSTEOPENIA 12/06/2007  . CALCULUS, KIDNEY 10/07/2007  . VERTIGO 10/07/2007  . Headache(784.0) 10/07/2007    Past Surgical History:  Procedure Laterality Date  . APPENDECTOMY    . BACK SURGERY  10-09   Dr. Patrice Paradise  . CARPAL TUNNEL RELEASE Right 08/14/2014   Procedure: RIGHT CARPAL TUNNEL RELEASE AND INJECT LEFT THUMB;  Surgeon: Daryll Brod, MD;  Location: [place];  Service: Orthopedics;  Laterality: Right;  . COLONOSCOPY    . LAPAROSCOPY N/A 02/21/2013   Procedure: LAPAROSCOPY OPERATIVE;  Surgeon: Margarette Asal, MD;  Location: [facility];  Service: Gynecology;  Laterality: N/A;  REQUESTING 5MM SCOPE WITH CAMERA  . TONSILLECTOMY    . TUBAL LIGATION    . UPPER GASTROINTESTINAL ENDOSCOPY      Prior to Admission medications   Medication Sig Start Date End Date Taking? Authorizing Provider  clonazePAM (KLONOPIN) 1 MG tablet TAKE 1 TABLET AT BEDTIME AS NEEDED FOR ANXIETY. Patient taking differently: Take 1 mg by mouth at bedtime as needed for anxiety.  08/22/18   Biagio Borg, MD  diphenoxylate-atropine (LOMOTIL) 2.5-0.025 MG tablet Take 1 tablet by mouth 4 (four) times daily as needed for diarrhea or loose stools. 08/15/18   Gatha Mayer, MD  DULoxetine (CYMBALTA) 60 MG capsule Take 1 capsule (60 mg total) by mouth daily. 05/27/18 05/27/19  Biagio Borg, MD  folic acid (FOLVITE) 1 MG tablet Take 1 tablet (1 mg total) by mouth daily. 09/05/18   Biagio Borg, MD  gabapentin (NEURONTIN) 300 MG capsule Take 300 mg by mouth 3 (three) times daily. 04/27/18   [provider]  lidocaine (LIDODERM) 5 % Place 1 patch onto the skin daily. Remove & Discard patch within 12 hours or as directed by MD 06/24/18   Biagio Borg, MD  morphine (MSIR) 15 MG tablet Take 15 mg by mouth 3 (three) times daily. 05/12/18   [provider]  pantoprazole (PROTONIX) 40 MG tablet TAKE 1 TABLET 30 MINUTES twice per day 06/24/18   Biagio Borg, MD  zoledronic acid (RECLAST) 5 MG/100ML SOLN Inject 5 mg into the vein as directed. Once a year...    [provider]    Allergies Atorvastatin; Levofloxacin; Omnicef [cefdinir]; Trazodone and nefazodone; and Sulfonamide derivatives  Family History  Problem Relation Age of Onset  . Colon cancer Father   . Prostate cancer Father   . Heart disease Mother        prev MVR, also has DJD  . Colon cancer Brother   .  Skin cancer Sister   . Esophageal cancer Neg Hx   . Rectal cancer Neg Hx   . Stomach cancer Neg Hx     Social History Social History   Tobacco Use  . Smoking status: Current Every Day Smoker    Packs/day: 1.00    Years: 30.00    Pack years: 30.00    Types: Cigarettes  . Smokeless tobacco: Never Used  Substance Use Topics  . Alcohol use: Yes    Alcohol/week: 0.0 standard drinks    Comment: occasional  . Drug use: No    Review of Systems Level 5 caveat:  history/ROS limited by acute intoxication  and severe pain  Constitutional: No fever/chills Eyes: No visual changes. ENT: No sore throat. Cardiovascular: Denies chest pain. Respiratory: Denies shortness of breath. Gastrointestinal: No abdominal pain.  No nausea, no vomiting.  No diarrhea.  No constipation. Genitourinary: Negative for dysuria. Musculoskeletal: Severe pain in right hip as described above. Integumentary: Negative for rash. Neurological: Negative for headaches, focal weakness or numbness.   ____________________________________________   PHYSICAL EXAM:  VITAL SIGNS: ED Triage Vitals  Enc Vitals Group     BP 10/01/18 0209 (!) 138/99     Pulse Rate 10/01/18 0209 87     Resp 10/01/18 0209 (!) 22     Temp 10/01/18 0209 99.3 F (37.4 C)     Temp Source 10/01/18 0209 Oral     SpO2 10/01/18 0209 97 %     Weight 10/01/18 0211 54.4 kg (120 lb)     Height 10/01/18 0211 1.6 m (5\' 3" )     Head Circumference --      Peak Flow --      Pain Score 10/01/18 0211 9     Pain Loc --      Pain Edu? --      Excl. in [place]? --     Constitutional: Alert and oriented.  Severe distress from right hip pain Eyes: Conjunctivae are normal. PERRL. EOMI. Head: Atraumatic. Nose: No congestion/rhinnorhea. Mouth/Throat: Mucous membranes are moist. Neck: No stridor.  No meningeal signs.  No cervical spine tenderness to palpation. Cardiovascular: Normal rate, regular rhythm. Good peripheral circulation. Grossly normal heart  sounds. Respiratory: Normal respiratory effort.  No retractions. Lungs CTAB. Gastrointestinal: Soft and nontender. No distention.  Musculoskeletal: The right lower extremity is slightly externally rotated.  Exam is limited by the patient's pain and tenderness.  Neurovascularly intact and the patient is able to wiggle her toes without difficulty. Neurologic:  Normal speech and language. No gross focal neurologic deficits are appreciated.  Skin:  Skin is warm, dry and intact. No rash noted.   ____________________________________________   LABS (all labs ordered are listed, but only abnormal results are displayed)  Labs Reviewed  BASIC METABOLIC PANEL - Abnormal; Notable for the following components:      Result Value   Sodium 131 (*)    Potassium 2.8 (*)    Chloride 84 (*)    Anion gap 17 (*)    All other components within normal limits  CBC WITH DIFFERENTIAL/PLATELET - Abnormal; Notable for the following components:   RBC 5.98 (*)    Hemoglobin 20.7 (*)    HCT 61.3 (*)    MCV 102.5 (*)    MCH 34.7 (*)    RDW 16.8 (*)    All other components within normal limits  URINALYSIS, ROUTINE W REFLEX MICROSCOPIC - Abnormal; Notable for the following components:   Color, Urine YELLOW (*)    APPearance CLEAR (*)    Hgb urine dipstick SMALL (*)    All other components within normal limits  ETHANOL - Abnormal; Notable for the following components:   Alcohol, Ethyl (B) 109 (*)    All other components within normal limits  SURGICAL PCR SCREEN  PROTIME-INR  MAGNESIUM  TSH  HEMOGLOBIN A1C  TYPE AND SCREEN   ____________________________________________  EKG  ED ECG REPORT I, Hinda Kehr, the attending physician, personally viewed and interpreted this ECG.  Date: 10/01/2018 EKG Time: 3:33 AM Rate: 107 Rhythm: Sinus tachycardia QRS Axis: normal Intervals: normal ST/T Wave abnormalities: Non-specific ST segment / T-wave changes, but no evidence  of acute ischemia. Narrative  Interpretation: no evidence of acute ischemia   ____________________________________________  RADIOLOGY I, Hinda Kehr, personally viewed and evaluated these images (plain radiographs) as part of my medical decision making, as well as reviewing the written report by the radiologist.  ED MD interpretation:  R intertrochanteric femur fracture.  No acute abnormalities on chest x-ray  Official radiology report(s): Dg Chest 1 View  Result Date: 10/01/2018 CLINICAL DATA:  68 y/o F; right hip pain after fall. History of hypertension and pneumonia. EXAM: CHEST  1 VIEW COMPARISON:  05/15/2018 chest radiograph. FINDINGS: Normal cardiac silhouette. Aortic atherosclerosis with calcification. Pulmonary venous hypertension. No focal consolidation. No pleural effusion or pneumothorax. No acute osseous abnormality is evident. IMPRESSION: Pulmonary venous hypertension. No focal consolidation. Aortic Atherosclerosis (ICD10-I70.0). Electronically Signed   By: Kristine Garbe M.D.   On: 10/01/2018 03:01   Dg Hip Unilat  With Pelvis 2-3 Views Right  Result Date: 10/01/2018 CLINICAL DATA:  67 y/o  F; right hip pain after fall. EXAM: DG HIP (WITH OR WITHOUT PELVIS) 2-3V RIGHT COMPARISON:  None. FINDINGS: Acute intertrochanteric mildly displaced and angulated fracture of the right proximal femur. No hip joint dislocation. No pelvic fracture or diastasis identified. Left proximal femur and hip joint is well maintained. Partially visualized lower lumbar spine fusion hardware. Vascular calcifications noted. IMPRESSION: Acute intertrochanteric mildly displaced and angulated fracture of the right proximal femur. Electronically Signed   By: Kristine Garbe M.D.   On: 10/01/2018 02:58    ____________________________________________   PROCEDURES  Critical Care performed: No   Procedure(s) performed:   Procedures   ____________________________________________   INITIAL IMPRESSION / ASSESSMENT  AND PLAN / ED COURSE  As part of my medical decision making, I reviewed the following data within the [facility] notes reviewed and incorporated, Labs reviewed , EKG interpreted , Old chart reviewed, Radiograph reviewed , Discussed by phone with orthpedics (Dr. Mack Guise), Discussed with admitting physician  and Notes from prior ED visits    Differential diagnosis includes, but is not limited to, femur fracture, pelvic fracture, hip dislocation.  I strongly feel that the patient is going to have a femur fracture but she is neurovascularly intact.  Given her chronic opioid use I will administer ketamine at analgesic doses to help with her severe pain while we proceed with the standard work-up.  Lab work is pending and radiographs are pending.  Clinical Course as of Oct 01 821  Sat Oct 01, 2018  0258 Discussed case with Dr. Mack Guise by phone.  Will discuss with hospitalist once workup is more complete.   [CF]  0258 Ordering ketamine 0.15 mg/kg IV (analgesic dosing) in opioid-dependent patient (due to her chronic back pain).  Will monitor during administration.   [CF]  [id number] I personally drew up 10 mg (1 mL) ketamine and administered it as a slow IV push, drawn up into a 18mL saline flush and pushed slowly.  I will reassess in about 15 minutes for the need for additional medication.   [CF]  0320 Of note, I am keeping the bottle of ketamine on me for safekeeping because I believe the patient will need additional doses.   [CF]  0321 No acute abnormalities on CXR (radiologist describes pulmonary venous hypertension).  Right intertrochanteric hip fracture.  Will discuss with hospitalist.   [CF]  [id number] The lab called to tell me that the patient's hematocrit is very high at 61.3 which will likely affect the results of the pro time  INR.  I am ordering a liter of fluids to rehydrate the patient because this could be secondary to dehydration.  However her creatinine is normal but her  potassium is low at 2.8 and her sodium is little bit low at 131.  Her anion gap is slightly elevated at 17.  These results will likely partially corrected with normal saline IV bolus but I have also ordered IV potassium given that the patient should remain n.p.o. for now.  I have also added on an ethanol level and a magnesium level.  Foley catheter is being placed at this time.   [CF]  [id number] Patient still in severe pain.  Personally administered 2nd dose of ketamine (0.3 mg/kg) as slow IV push.  Nurse Wells Guiles present in room during administration.  Afterwards I gave the bottle of ketamine to [place] for Pyxis wasting.     [CF]  [phone] Discussed case with hospitalist who will admit.  Patient also with alcohol intoxication   [CF]    Clinical Course User Index [CF] Hinda Kehr, MD    ____________________________________________  FINAL CLINICAL IMPRESSION(S) / ED DIAGNOSES  Final diagnoses:  Closed intertrochanteric fracture of hip, right, initial encounter ([place])  Chronic pain syndrome  Narcotic dependence ([place])  Hypokalemia  Elevated hematocrit  Alcohol abuse with intoxication ([place])     MEDICATIONS GIVEN DURING THIS VISIT:  Medications  0.9 %  sodium chloride infusion ( Intravenous Transfusing/Transfer 10/01/18 0441)  potassium chloride 10 mEq in 100 mL IVPB (10 mEq Intravenous New Bag/Given 10/01/18 0816)  DULoxetine (CYMBALTA) DR capsule 60 mg (has no administration in time range)  pantoprazole (PROTONIX) EC tablet 40 mg (has no administration in time range)  clonazePAM (KLONOPIN) tablet 1 mg (has no administration in time range)  gabapentin (NEURONTIN) capsule 300 mg (has no administration in time range)  morphine (MS CONTIN) 12 hr tablet 30 mg (has no administration in time range)  morphine 2 MG/ML injection 2 mg (2 mg Intravenous Given 10/01/18 0438)  HYDROmorphone (DILAUDID) injection 0.5 mg (0.5 mg Intravenous Given 10/01/18 0513)  oxyCODONE (Oxy IR/ROXICODONE) immediate release  tablet 5 mg (5 mg Oral Given 10/01/18 0659)  0.9 % NaCl with KCl 40 mEq / L  infusion (100 mL/hr Intravenous New Bag/Given 10/01/18 0626)  acetaminophen (TYLENOL) tablet 650 mg (has no administration in time range)    Or  acetaminophen (TYLENOL) suppository 650 mg (has no administration in time range)  docusate sodium (COLACE) capsule 100 mg (has no administration in time range)  ondansetron (ZOFRAN) tablet 4 mg (has no administration in time range)    Or  ondansetron (ZOFRAN) injection 4 mg (has no administration in time range)  LORazepam (ATIVAN) tablet 1 mg (has no administration in time range)    Or  LORazepam (ATIVAN) injection 1 mg (has no administration in time range)  thiamine (VITAMIN B-1) tablet 100 mg (has no administration in time range)    Or  thiamine (B-1) injection 100 mg (has no administration in time range)  folic acid (FOLVITE) tablet 1 mg (has no administration in time range)  multivitamin with minerals tablet 1 tablet (has no administration in time range)  LORazepam (ATIVAN) injection 0-4 mg (has no administration in time range)    Followed by  LORazepam (ATIVAN) injection 0-4 mg (has no administration in time range)  ketamine (KETALAR) injection 10 mg (10 mg Intravenous Given by Other 10/01/18 0356)  sodium chloride 0.9 % bolus 1,000 mL (1,000 mLs Intravenous Transfusing/Transfer 10/01/18 0440)  ketamine (KETALAR) injection  16 mg (16 mg Intravenous Given by Other 10/01/18 0431)     ED Discharge Orders    None       Note:  This document was prepared using Dragon voice recognition software and may include unintentional dictation errors.    Hinda Kehr, MD 10/01/18 [phone]

## 2018-10-01 NOTE — Progress Notes (Signed)
Pts repeat potasium 3.5. MD Mack Guise notified.

## 2018-10-01 NOTE — Anesthesia Postprocedure Evaluation (Signed)
Anesthesia Post Note  Patient: Denise King  Procedure(s) Performed: INTRAMEDULLARY (IM) NAIL INTERTROCHANTRIC (Right )  Patient location during evaluation: PACU Anesthesia Type: General Level of consciousness: awake and alert Pain management: pain level controlled Vital Signs Assessment: post-procedure vital signs reviewed and stable Respiratory status: spontaneous breathing, nonlabored ventilation, respiratory function stable and patient connected to face mask oxygen Cardiovascular status: blood pressure returned to baseline and stable Postop Assessment: no apparent nausea or vomiting Anesthetic complications: no     Last Vitals:  Vitals:   10/01/18 1450 10/01/18 1506  BP: 118/76 133/85  Pulse: (!) 107 (!) 107  Resp: 10 16  Temp: 36.8 C   SpO2: 97% 98%    Last Pain:  Vitals:   10/01/18 1450  TempSrc:   PainSc: [place]

## 2018-10-01 NOTE — Consult Note (Signed)
ORTHOPAEDIC CONSULTATION  REQUESTING PHYSICIAN: Dustin Flock, MD  Chief Complaint: Right hip pain status post fall  HPI: Denise King is a 67 y.o. female who complains of right hip pain status post fall at home.  Patient is reported to have been in her greenhouse in the middle the night and sustained a fall.  Patient denies other injuries or loss of consciousness.  Patient is a blood alcohol level of 109 mg/dL.  Patient has a history of chronic pain secondary to back pain.  Patient has had a previous lumbar instrumented fusion.  Past Medical History:  Diagnosis Date  . Acute cystitis   . Acute respiratory failure ([place])   . Allergy    as a child grew out of them  . Anemia    pernicious anemia  . Anxiety   . Arthritis   . Back pain   . Blood transfusion   . CAP (community acquired pneumonia)   . Chronic pain syndrome   . Depression   . Diverticulosis of colon   . Dysthymia   . Esophagitis   . EtOH dependence ([place])   . Gastritis   . GERD (gastroesophageal reflux disease)   . H/O chest pain Dec. 2013   no work up done  . Hx of colonic polyps   . Hypertension   . Irritable bowel syndrome   . Osteopenia   . Osteoporosis   . Pneumonia 2019  . Polypharmacy   . Reflux esophagitis   . Right sided sciatica 12/22/2017  . Tobacco use disorder   . Unspecified chronic bronchitis ([place])   . Vertigo   . Vitamin B12 deficiency   . Wears glasses    Past Surgical History:  Procedure Laterality Date  . APPENDECTOMY    . BACK SURGERY  10-09   Dr. Patrice Paradise  . CARPAL TUNNEL RELEASE Right 08/14/2014   Procedure: RIGHT CARPAL TUNNEL RELEASE AND INJECT LEFT THUMB;  Surgeon: Daryll Brod, MD;  Location: [facility];  Service: Orthopedics;  Laterality: Right;  . COLONOSCOPY    . LAPAROSCOPY N/A 02/21/2013   Procedure: LAPAROSCOPY OPERATIVE;  Surgeon: Margarette Asal, MD;  Location: [facility];  Service: Gynecology;  Laterality: N/A;  REQUESTING 5MM SCOPE WITH CAMERA  .  TONSILLECTOMY    . TUBAL LIGATION    . UPPER GASTROINTESTINAL ENDOSCOPY     Social History   Socioeconomic History  . Marital status: Married    Spouse name: Not on file  . Number of children: 4  . Years of education: 55  . Highest education level: Not on file  Occupational History  . Occupation: retired    Comment: disablility  Social Needs  . Financial resource strain: Not on file  . Food insecurity:    Worry: Not on file    Inability: Not on file  . Transportation needs:    Medical: Not on file    Non-medical: Not on file  Tobacco Use  . Smoking status: Current Every Day Smoker    Packs/day: 1.00    Years: 30.00    Pack years: 30.00    Types: Cigarettes  . Smokeless tobacco: Never Used  Substance and Sexual Activity  . Alcohol use: Yes    Alcohol/week: 0.0 standard drinks    Comment: occasional  . Drug use: No  . Sexual activity: Not on file  Lifestyle  . Physical activity:    Days per week: Not on file    Minutes per session: Not on file  .  Stress: Not on file  Relationships  . Social connections:    Talks on phone: Not on file    Gets together: Not on file    Attends religious service: Not on file    Active member of club or organization: Not on file    Attends meetings of clubs or organizations: Not on file    Relationship status: Not on file  Other Topics Concern  . Not on file  Social History Narrative   Married x 38 yrs. Has BS degree in Horticulture from [employer]. Currently resides in a house with her husband. 3 cats.  Fun: used to like to do a lot of things.    Denies religious beliefs that would effect health care.    Family History  Problem Relation Age of Onset  . Colon cancer Father   . Prostate cancer Father   . Heart disease Mother        prev MVR, also has DJD  . Colon cancer Brother   . Skin cancer Sister   . Esophageal cancer Neg Hx   . Rectal cancer Neg Hx   . Stomach cancer Neg Hx    Allergies  Allergen Reactions  . Atorvastatin  Nausea And Vomiting  . Levofloxacin     Reaction: achilles tendon pain  . Omnicef [Cefdinir] Diarrhea    Uncontrollable diarrhea  . Trazodone And Nefazodone     "Felt like I was going to faint"  . Sulfonamide Derivatives Rash   Prior to Admission medications   Medication Sig Start Date End Date Taking? Authorizing Provider  clonazePAM (KLONOPIN) 1 MG tablet TAKE 1 TABLET AT BEDTIME AS NEEDED FOR ANXIETY. Patient taking differently: Take 1 mg by mouth at bedtime as needed for anxiety.  08/22/18   Biagio Borg, MD  diphenoxylate-atropine (LOMOTIL) 2.5-0.025 MG tablet Take 1 tablet by mouth 4 (four) times daily as needed for diarrhea or loose stools. 08/15/18   Gatha Mayer, MD  DULoxetine (CYMBALTA) 60 MG capsule Take 1 capsule (60 mg total) by mouth daily. 05/27/18 05/27/19  Biagio Borg, MD  folic acid (FOLVITE) 1 MG tablet Take 1 tablet (1 mg total) by mouth daily. 09/05/18   Biagio Borg, MD  gabapentin (NEURONTIN) 300 MG capsule Take 300 mg by mouth 3 (three) times daily. 04/27/18   [provider]  lidocaine (LIDODERM) 5 % Place 1 patch onto the skin daily. Remove & Discard patch within 12 hours or as directed by MD 06/24/18   Biagio Borg, MD  morphine (MSIR) 15 MG tablet Take 15 mg by mouth 3 (three) times daily. 05/12/18   [provider]  pantoprazole (PROTONIX) 40 MG tablet TAKE 1 TABLET 30 MINUTES twice per day 06/24/18   Biagio Borg, MD  zoledronic acid (RECLAST) 5 MG/100ML SOLN Inject 5 mg into the vein as directed. Once a year...    [provider]   Dg Chest 1 View  Result Date: 10/01/2018 CLINICAL DATA:  67 y/o F; right hip pain after fall. History of hypertension and pneumonia. EXAM: CHEST  1 VIEW COMPARISON:  05/15/2018 chest radiograph. FINDINGS: Normal cardiac silhouette. Aortic atherosclerosis with calcification. Pulmonary venous hypertension. No focal consolidation. No pleural effusion or pneumothorax. No acute osseous abnormality is evident.  IMPRESSION: Pulmonary venous hypertension. No focal consolidation. Aortic Atherosclerosis (ICD10-I70.0). Electronically Signed   By: Kristine Garbe M.D.   On: 10/01/2018 03:01   Dg Hip Unilat  With Pelvis 2-3 Views Right  Result Date:  10/01/2018 CLINICAL DATA:  66 y/o  F; right hip pain after fall. EXAM: DG HIP (WITH OR WITHOUT PELVIS) 2-3V RIGHT COMPARISON:  None. FINDINGS: Acute intertrochanteric mildly displaced and angulated fracture of the right proximal femur. No hip joint dislocation. No pelvic fracture or diastasis identified. Left proximal femur and hip joint is well maintained. Partially visualized lower lumbar spine fusion hardware. Vascular calcifications noted. IMPRESSION: Acute intertrochanteric mildly displaced and angulated fracture of the right proximal femur. Electronically Signed   By: Kristine Garbe M.D.   On: 10/01/2018 02:58    Positive ROS: All other systems have been reviewed and were otherwise negative with the exception of those mentioned in the HPI and as above.  Physical Exam: General: Alert, patient complaining of significant right hip pain.  Patient is seen in her hospital room with her daughter at the bedside.  MUSCULOSKELETAL: Right lower extremity: Patient skin is intact.  There is no erythema ecchymosis or swelling.  Compartments are soft and compressible.  She has palpable pedal pulses, intact sensation to light touch throughout the right lower extremity and intact motor function.  She can flex and extend her toes and dorsiflex and plantarflex her ankle.  Right lower extremity is externally rotated and slightly shortened.  Assessment: Right intertrochanteric hip fracture  Plan: I explained to the patient and her daughter that she has sustained a fracture of the right hip.  I am recommending intramedullary fixation for this right displaced intertrochanteric hip fracture.  I discussed the details of the operation as well as the postoperative  course.  With the patient's permission I have also called her daughter-in-law and explained the nature of the injury and the surgical treatment and hospital course.  I have also discussed the risks and benefits of surgery.  Patient understands these risks include infection, bleeding requiring blood transfusion, nerve or blood vessel injury which may lead to weakness or numbness in the right lower extremity, leg length discrepancy, change in lower extremity rotation, persistent hip pain, hardware failure and the need for further surgery.  Medical risks include but are not limited to DVT and pulmonary embolism, myocardial infarction, stroke, pneumonia, respiratory failure and death.  Patient understood these risks and wished to proceed.  Patient has hypokalemia and is currently receiving IV potassium repletion.  Her potassium was 2.8.  The minimum for surgery is 3.0.  She will have her potassium rechecked.  Once potassium is over 3.0 she can be brought to the OR for surgical fixation.  Reviewed the patient's radiographs and laboratory studies in preparation for this case.  The patient has been n.p.o.    Thornton Park, MD    10/01/2018 10:53 AM

## 2018-10-01 NOTE — Op Note (Signed)
DATE OF SURGERY:  10/01/2018  TIME: 2:59 PM  PATIENT NAME:  Denise King  AGE: 67 y.o.  PRE-OPERATIVE DIAGNOSIS:  right intertrochanteric hip fracture  POST-OPERATIVE DIAGNOSIS:  SAME  PROCEDURE:  INTRAMEDULLARY (IM) NAIL INTERTROCHANTRIC  SURGEON:  Dariyah Garduno  OPERATIVE IMPLANTS: Biomet short Affixus nail 11 x 180 mm, 95 mm lag screw with a 32 mm distal interlocking screw  PREOPERATIVE INDICATIONS:  Denise King is a 67 y.o. year old who fell and suffered a hip fracture. She was brought into the ER and then admitted and medically cleared for surgical intervention.    The risks, benefits and alternatives were discussed with the patient and their family.  The risks include but are not limited to infection, bleeding, nerve or blood vessel injury, malunion, nonunion, hardware prominence, hardware failure, change in leg lengths or lower extremity rotation need for further surgery including hardware removal with conversion to a total hip arthroplasty. Medical risks include but are not limited to DVT and pulmonary embolism, myocardial infarction, stroke, pneumonia, respiratory failure and death. The patient and their family understood these risks and wished to proceed with surgery.  OPERATIVE PROCEDURE:  The patient was brought to the operating room and placed in the supine position on the fracture table. General anesthesia was administered given the patient's history of spinal instrumentation.  A closed reduction was performed under C-arm guidance.  The fracture reduction was confirmed on both AP and lateral views. A time out was performed to verify the patient's name, date of birth, medical record number, correct site of surgery correct procedure to be performed. The timeout was also used to verify the patient received antibiotics and all appropriate instruments, implants and radiographic studies were available in the room. Once all in attendance were in agreement, the case  began. The patient was prepped and draped in a sterile fashion. She received preoperative antibiotics with vancomycin given her allergy to cephalosporins..  An incision was made proximal to the greater trochanter in line with the femur. A guidewire was placed over the tip of the greater trochanter and advanced into the proximal femur to the level of the lesser trochanter.  Confirmation of the drill pin position was made on AP and lateral C-arm images.  The threaded guidepin was then overdrilled with the proximal femoral drill.  The nail was then inserted into the proximal femur, across the fracture site and into the femoral shaft. Its position was confirmed on AP and lateral C-arm images.   Once the nail was completely seated, the drill guide for the lag screw was placed through the guide arm for the Affixus nail. A guidepin was then placed through this drill guide and advanced through the lateral cortex of the femur, across the fracture site and into the femoral head achieving a tip apex distance of less than 25 mm. The length of the drill pin was measured, and then the drill for the lag screw was advanced through the lateral cortex, across the fracture site and up into the femoral head to the depth of the lag screw..  The lag screw was then advanced by hand into position across the fracture site into the femoral head. Its final position was confirmed on AP and lateral C-arm images. Compression was applied as traction was carefully released. The set screw in the top of the intramedullary rod was tightened by hand using a screwdriver. It was backed off a quarter turn to allow for compression at the fracture site.  The drill  sleeve for the distal interlocking screw was then placed through the Affixus guide arm. A small stab incision was made to allow the drill guide to approximate the lateral cortex of the femur. The drill for the distal interlocking screw was then advanced bicortically. The depth of this drill  was measured. A distal interlocking screw with the length measured was then inserted by hand through the guide arm. Final C-arm images of the entire intramedullary construct were taken in both the AP and lateral planes.   The wounds were irrigated copiously and closed with 0 Vicryl for closure of the deep fascia and 2-0 Vicryl for subcutaneous closure. The skin was approximated with staples. A dry sterile dressing was applied. I was scrubbed and present the entire case and all sharp and instrument counts were correct at the conclusion of the case. Patient was transferred to hospital bed and brought to PACU in stable condition. I spoke with the patient's family in the postop consultation room to let them know the case was performed without complication and the patient was stable in the recovery room.     Timoteo Gaul, MD

## 2018-10-01 NOTE — Anesthesia Post-op Follow-up Note (Signed)
Anesthesia QCDR form completed.        

## 2018-10-01 NOTE — H&P (Signed)
Denise King is an 67 y.o. female.   Chief Complaint: Fall HPI: The patient with past medical history of anemia, hypertension, osteopenia presents to the emergency department with a fall.  The patient cannot explain exacerbation since of her fall but it seems to be mechanical.  X-ray of the hip showed fracture which prompted the emergency department staff called the hospitalist service for admission.  Past Medical History:  Diagnosis Date  . Acute cystitis   . Acute respiratory failure ([place])   . Allergy    as a child grew out of them  . Anemia    pernicious anemia  . Anxiety   . Arthritis   . Back pain   . Blood transfusion   . CAP (community acquired pneumonia)   . Chronic pain syndrome   . Depression   . Diverticulosis of colon   . Dysthymia   . Esophagitis   . EtOH dependence ([place])   . Gastritis   . GERD (gastroesophageal reflux disease)   . H/O chest pain Dec. 2013   no work up done  . Hx of colonic polyps   . Hypertension   . Irritable bowel syndrome   . Osteopenia   . Osteoporosis   . Pneumonia 2019  . Polypharmacy   . Reflux esophagitis   . Right sided sciatica 12/22/2017  . Tobacco use disorder   . Unspecified chronic bronchitis ([place])   . Vertigo   . Vitamin B12 deficiency   . Wears glasses     Past Surgical History:  Procedure Laterality Date  . APPENDECTOMY    . BACK SURGERY  10-09   Dr. Patrice Paradise  . CARPAL TUNNEL RELEASE Right 08/14/2014   Procedure: RIGHT CARPAL TUNNEL RELEASE AND INJECT LEFT THUMB;  Surgeon: Daryll Brod, MD;  Location: [facility];  Service: Orthopedics;  Laterality: Right;  . COLONOSCOPY    . LAPAROSCOPY N/A 02/21/2013   Procedure: LAPAROSCOPY OPERATIVE;  Surgeon: Margarette Asal, MD;  Location: [facility];  Service: Gynecology;  Laterality: N/A;  REQUESTING 5MM SCOPE WITH CAMERA  . TONSILLECTOMY    . TUBAL LIGATION    . UPPER GASTROINTESTINAL ENDOSCOPY      Family History  Problem Relation Age of Onset  . Colon  cancer Father   . Prostate cancer Father   . Heart disease Mother        prev MVR, also has DJD  . Colon cancer Brother   . Skin cancer Sister   . Esophageal cancer Neg Hx   . Rectal cancer Neg Hx   . Stomach cancer Neg Hx    Social History:  reports that she has been smoking cigarettes. She has a 30.00 pack-year smoking history. She has never used smokeless tobacco. She reports that she drinks alcohol. She reports that she does not use drugs.  Allergies:  Allergies  Allergen Reactions  . Atorvastatin Nausea And Vomiting  . Levofloxacin     Reaction: achilles tendon pain  . Omnicef [Cefdinir] Diarrhea    Uncontrollable diarrhea  . Trazodone And Nefazodone     "Felt like I was going to faint"  . Sulfonamide Derivatives Rash    Medications Prior to Admission  Medication Sig Dispense Refill  . clonazePAM (KLONOPIN) 1 MG tablet TAKE 1 TABLET AT BEDTIME AS NEEDED FOR ANXIETY. (Patient taking differently: Take 1 mg by mouth at bedtime as needed for anxiety. ) 90 tablet 1  . diphenoxylate-atropine (LOMOTIL) 2.5-0.025 MG tablet Take 1 tablet by mouth 4 (  four) times daily as needed for diarrhea or loose stools. 60 tablet 0  . DULoxetine (CYMBALTA) 60 MG capsule Take 1 capsule (60 mg total) by mouth daily. 90 capsule 3  . folic acid (FOLVITE) 1 MG tablet Take 1 tablet (1 mg total) by mouth daily. 90 tablet 3  . gabapentin (NEURONTIN) 300 MG capsule Take 300 mg by mouth 3 (three) times daily.    Marland Kitchen lidocaine (LIDODERM) 5 % Place 1 patch onto the skin daily. Remove & Discard patch within 12 hours or as directed by MD 60 patch 1  . morphine (MSIR) 15 MG tablet Take 15 mg by mouth 3 (three) times daily.    . pantoprazole (PROTONIX) 40 MG tablet TAKE 1 TABLET 30 MINUTES twice per day 180 tablet 3  . zoledronic acid (RECLAST) 5 MG/100ML SOLN Inject 5 mg into the vein as directed. Once a year...      Results for orders placed or performed during the hospital encounter of 10/01/18 (from the past  48 hour(s))  Basic metabolic panel     Status: Abnormal   Collection Time: 10/01/18  2:53 AM  Result Value Ref Range   Sodium 131 (L) 135 - 145 mmol/L   Potassium 2.8 (L) 3.5 - 5.1 mmol/L   Chloride 84 (L) 98 - 111 mmol/L   CO2 30 22 - 32 mmol/L   Glucose, Bld 89 70 - 99 mg/dL   BUN 8 8 - 23 mg/dL   Creatinine, Ser 0.58 0.44 - 1.00 mg/dL   Calcium 9.0 8.9 - 10.3 mg/dL   GFR calc non Af Amer >60 >60 mL/min   GFR calc Af Amer >60 >60 mL/min    Comment: (NOTE) The eGFR has been calculated using the CKD EPI equation. This calculation has not been validated in all clinical situations. eGFR's persistently <60 mL/min signify possible Chronic Kidney Disease.    Anion gap 17 (H) 5 - 15    Comment: Performed at [facility], [address] [place], [place] [address]  CBC WITH DIFFERENTIAL     Status: Abnormal   Collection Time: 10/01/18  2:53 AM  Result Value Ref Range   WBC 7.2 3.6 - 11.0 K/uL   RBC 5.98 (H) 3.80 - 5.20 MIL/uL   Hemoglobin 20.7 (H) 12.0 - 16.0 g/dL   HCT 61.3 (H) 35.0 - 47.0 %   MCV 102.5 (H) 80.0 - 100.0 fL   MCH 34.7 (H) 26.0 - 34.0 pg   MCHC 33.8 32.0 - 36.0 g/dL   RDW 16.8 (H) 11.5 - 14.5 %   Platelets 212 150 - 440 K/uL   Neutrophils Relative % 38 %   Neutro Abs 2.8 1.4 - 6.5 K/uL   Lymphocytes Relative 51 %   Lymphs Abs 3.6 1.0 - 3.6 K/uL   Monocytes Relative 9 %   Monocytes Absolute 0.7 0.2 - 0.9 K/uL   Eosinophils Relative 1 %   Eosinophils Absolute 0.1 0 - 0.7 K/uL   Basophils Relative 1 %   Basophils Absolute 0.1 0 - 0.1 K/uL    Comment: Performed at [facility], [address] [place],  [address]  Protime-INR     Status: None   Collection Time: 10/01/18  2:53 AM  Result Value Ref Range   Prothrombin Time 11.9 11.4 - 15.2 seconds    Comment: A HCT value >55% may artifactually increase the PT.   INR 0.88     Comment: Performed at [facility], [address] [place], [place] [address]  Type and screen  [place]     Status: None   Collection Time: 10/01/18  2:53 AM  Result Value Ref Range   ABO/RH(D) A POS    Antibody Screen NEG    Sample Expiration      10/04/2018 Performed at [facility], [address] [place], [place] [address]   Urinalysis, Routine w reflex microscopic     Status: Abnormal   Collection Time: 10/01/18  2:53 AM  Result Value Ref Range   Color, Urine YELLOW (A) YELLOW   APPearance CLEAR (A) CLEAR   Specific Gravity, Urine 1.005 1.005 - 1.030   pH 5.0 5.0 - 8.0   Glucose, UA NEGATIVE NEGATIVE mg/dL   Hgb urine dipstick SMALL (A) NEGATIVE   Bilirubin Urine NEGATIVE NEGATIVE   Ketones, ur NEGATIVE NEGATIVE mg/dL   Protein, ur NEGATIVE NEGATIVE mg/dL   Nitrite NEGATIVE NEGATIVE   Leukocytes, UA NEGATIVE NEGATIVE   RBC / HPF 0-5 0 - 5 RBC/hpf   WBC, UA 0-5 0 - 5 WBC/hpf   Bacteria, UA NONE SEEN NONE SEEN   Squamous Epithelial / LPF 0-5 0 - 5   Mucus PRESENT     Comment: Performed at [facility], [address], [place], [place] [address]  Magnesium     Status: None   Collection Time: 10/01/18  2:53 AM  Result Value Ref Range   Magnesium 2.1 1.7 - 2.4 mg/dL    Comment: Performed at [facility], [address] [place], [place] [address]  TSH     Status: None   Collection Time: 10/01/18  2:53 AM  Result Value Ref Range   TSH 1.108 0.350 - 4.500 uIU/mL    Comment: Performed by a 3rd Generation assay with a functional sensitivity of <=0.01 uIU/mL. Performed at [facility], [address] [place], [place] [address]   Ethanol     Status: Abnormal   Collection Time: 10/01/18  3:35 AM  Result Value Ref Range   Alcohol, Ethyl (B) 109 (H) <10 mg/dL    Comment: CRITICAL RESULT CALLED TO, READ BACK BY AND VERIFIED WITH BECCA LYNN ON 10/01/18 AT 0422 [place] (NOTE) Lowest detectable limit for serum alcohol is 10 mg/dL. For medical purposes only. Performed at [facility], [address], [place], [place] [address]    Dg Chest 1 View  Result Date: 10/01/2018 CLINICAL DATA:  67 y/o F; right hip pain after fall. History of hypertension and pneumonia. EXAM: CHEST  1 VIEW COMPARISON:  05/15/2018 chest radiograph. FINDINGS: Normal cardiac silhouette. Aortic atherosclerosis with calcification. Pulmonary venous hypertension. No focal consolidation. No pleural effusion or pneumothorax. No acute osseous abnormality is evident. IMPRESSION: Pulmonary venous hypertension. No focal consolidation. Aortic Atherosclerosis (ICD10-I70.0). Electronically Signed   By: Kristine Garbe M.D.   On: 10/01/2018 03:01   Dg Hip Unilat  With Pelvis 2-3 Views Right  Result Date: 10/01/2018 CLINICAL DATA:  67 y/o  F; right hip pain after fall. EXAM: DG HIP (WITH OR WITHOUT PELVIS) 2-3V RIGHT COMPARISON:  None. FINDINGS: Acute intertrochanteric mildly displaced and angulated fracture of the right proximal femur. No hip joint dislocation. No pelvic fracture or diastasis identified. Left proximal femur and hip joint is well maintained. Partially visualized lower lumbar spine fusion hardware. Vascular calcifications noted. IMPRESSION: Acute intertrochanteric mildly displaced and angulated fracture of the right proximal femur. Electronically Signed   By: Kristine Garbe M.D.   On: 10/01/2018  02:58    Review of Systems  Constitutional: Negative for chills and fever.  HENT: Negative for sore throat and tinnitus.   Eyes: Negative for blurred vision and redness.  Respiratory: Negative for cough and shortness of breath.   Cardiovascular: Negative for chest pain, palpitations, orthopnea and PND.  Gastrointestinal: Negative for abdominal pain, diarrhea, nausea and vomiting.  Genitourinary: Negative for dysuria, frequency and urgency.  Musculoskeletal: Positive for falls. Negative for joint pain and myalgias.  Skin: Negative for rash.       No lesions  Neurological: Negative for speech change, focal  weakness and weakness.  Endo/Heme/Allergies: Does not bruise/bleed easily.       No temperature intolerance  Psychiatric/Behavioral: Negative for depression and suicidal ideas.    Blood pressure (!) 152/89, pulse (!) 108, temperature 98.2 F (36.8 C), temperature source Oral, resp. rate 18, height '5\' 3"'  (1.6 m), weight 57 kg, SpO2 (!) 89 %. Physical Exam  Vitals reviewed. Constitutional: She is oriented to person, place, and time. She appears well-developed and well-nourished. No distress.  HENT:  Head: Normocephalic and atraumatic.  Mouth/Throat: Oropharynx is clear and moist.  Eyes: Pupils are equal, round, and reactive to light. Conjunctivae and EOM are normal. No scleral icterus.  Neck: Normal range of motion. Neck supple. No JVD present. No tracheal deviation present. No thyromegaly present.  Cardiovascular: Normal rate, regular rhythm and normal heart sounds. Exam reveals no gallop and no friction rub.  No murmur heard. Respiratory: Effort normal and breath sounds normal.  GI: Soft. Bowel sounds are normal. She exhibits no distension. There is no tenderness.  Genitourinary:  Genitourinary Comments: Deferred  Musculoskeletal: She exhibits deformity. She exhibits no edema.  Lymphadenopathy:    She has no cervical adenopathy.  Neurological: She is alert and oriented to person, place, and time. No cranial nerve deficit. She exhibits normal muscle tone.  Skin: Skin is warm and dry. No rash noted. No erythema.  Psychiatric: She has a normal mood and affect. Her behavior is normal. Judgment and thought content normal.     Assessment/Plan This is a 67 year old female admitted for hip fracture. 1.  Hip fracture: Right intertrochanteric regulated fracture of the proximal femur.  N.p.o. for surgery.  Manage pain with long-acting narcotics per the patient's home regimen in addition to IV morphine as needed for severe pain. 2.  Hypokalemia: Replete potassium.  No arrhythmias. 3.   Polycythemia: Possible etiology is mild hypoxemia due to chronic lung disease.  4.  Tobacco abuse: Counseled on smoking cessation.  NicoDerm patch while hospitalized. 5.  Possible alcohol abuse: Ethanol level pending.  CIWA scale as needed 6.  DVT prophylaxis: SCDs 7.  GI prophylaxis: None The patient is a full code.  Time spent on admission orders and patient care proximally 45 minutes  Harrie Foreman, MD 10/01/2018, 7:08 AM
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
## 2018-10-01 NOTE — Progress Notes (Signed)
[facility]                                                                                                                                                                                  Patient Demographics   Denise King, is a 67 y.o. female, DOB - 1951-10-10, WNU:[id number]  Admit date - 10/01/2018   Admitting Physician Harrie Foreman, MD  Outpatient Primary MD for the patient is Biagio Borg, MD   LOS - 0  Subjective: Patient admitted with right hip fracture Noticed to have hypokalemia and polycythemia Patient complains of pain in her hip   Review of Systems:   CONSTITUTIONAL: No documented fever. No fatigue, weakness. No weight gain, no weight loss.  EYES: No blurry or double vision.  ENT: No tinnitus. No postnasal drip. No redness of the oropharynx.  RESPIRATORY: No cough, no wheeze, no hemoptysis. No dyspnea.  CARDIOVASCULAR: No chest pain. No orthopnea. No palpitations. No syncope.  GASTROINTESTINAL: No nausea, no vomiting or diarrhea. No abdominal pain. No melena or hematochezia.  GENITOURINARY: No dysuria or hematuria.  ENDOCRINE: No polyuria or nocturia. No heat or cold intolerance.  HEMATOLOGY: No anemia. No bruising. No bleeding.  INTEGUMENTARY: No rashes. No lesions.  MUSCULOSKELETAL: No arthritis. No swelling. No gout.  NEUROLOGIC: No numbness, tingling, or ataxia. No seizure-type activity.  PSYCHIATRIC: No anxiety. No insomnia. No ADD.    Vitals:   Vitals:   10/01/18 0430 10/01/18 0505 10/01/18 0511 10/01/18 0750  BP: 135/83 (!) 152/89  (!) 149/82  Pulse:  (!) 108  (!) 106  Resp: (!) 21 18    Temp:  98.2 F (36.8 C)  98.2 F (36.8 C)  TempSrc:  Oral  Oral  SpO2:  (!) 89%  95%  Weight:   57 kg   Height:        Wt Readings from Last 3 Encounters:  10/01/18 57 kg  08/31/18 55 kg  06/24/18 59 kg     Intake/Output Summary (Last 24 hours) at 10/01/2018 1306 Last data filed at 10/01/2018 0914 Gross per  24 hour  Intake -  Output 900 ml  Net -900 ml    Physical Exam:   GENERAL: Pleasant-appearing in no apparent distress.  HEAD, EYES, EARS, NOSE AND THROAT: Atraumatic, normocephalic. Extraocular muscles are intact. Pupils equal and reactive to light. Sclerae anicteric. No conjunctival injection. No oro-pharyngeal erythema.  NECK: Supple. There is no jugular venous distention. No bruits, no lymphadenopathy, no thyromegaly.  HEART: Regular rate and rhythm,. No murmurs, no rubs, no clicks.  LUNGS: Clear to auscultation bilaterally. No rales or rhonchi. No wheezes.  ABDOMEN: Soft, flat, nontender,  nondistended. Has good bowel sounds. No hepatosplenomegaly appreciated.  EXTREMITIES: No evidence of any cyanosis, clubbing, or peripheral edema.  +2 pedal and radial pulses bilaterally.  NEUROLOGIC: The patient is alert, awake, and oriented x3 with no focal motor or sensory deficits appreciated bilaterally.  SKIN: Moist and warm with no rashes appreciated.  Psych: Not anxious, depressed LN: No inguinal LN enlargement    Antibiotics   Anti-infectives (From admission, onward)   Start     Dose/Rate Route Frequency Ordered Stop   10/01/18 1030  vancomycin (VANCOCIN) IVPB 1000 mg/200 mL premix     1,000 mg 200 mL/hr over 60 Minutes Intravenous  Once 10/01/18 1028        Medications   Scheduled Meds: . docusate sodium  100 mg Oral BID  . DULoxetine  60 mg Oral Daily  . folic acid  1 mg Oral Daily  . gabapentin  300 mg Oral TID  . LORazepam  0-4 mg Intravenous Q6H   Followed by  . [START ON 10/03/2018] LORazepam  0-4 mg Intravenous Q12H  . morphine  30 mg Oral Q12H  . multivitamin with minerals  1 tablet Oral Daily  . pantoprazole  40 mg Oral BID  . thiamine  100 mg Oral Daily   Or  . thiamine  100 mg Intravenous Daily   Continuous Infusions: . 0.9 % NaCl with KCl 40 mEq / L 100 mL/hr (10/01/18 0626)  . vancomycin     PRN Meds:.acetaminophen **OR** acetaminophen, clonazePAM,  HYDROmorphone (DILAUDID) injection, LORazepam **OR** LORazepam, morphine injection, ondansetron **OR** ondansetron (ZOFRAN) IV, oxyCODONE   Data Review:   Micro Results Recent Results (from the past 240 hour(s))  Surgical pcr screen     Status: None   Collection Time: 10/01/18  7:07 AM  Result Value Ref Range Status   MRSA, PCR NEGATIVE NEGATIVE Final   Staphylococcus aureus NEGATIVE NEGATIVE Final    Comment: (NOTE) The Xpert SA Assay (FDA approved for NASAL specimens in patients 38 years of age and older), is one component of a comprehensive surveillance program. It is not intended to diagnose infection nor to guide or monitor treatment. Performed at [facility], [address] [place], [place] [address]     Radiology Reports Dg Chest 1 View  Result Date: 10/01/2018 CLINICAL DATA:  67 y/o F; right hip pain after fall. History of hypertension and pneumonia. EXAM: CHEST  1 VIEW COMPARISON:  05/15/2018 chest radiograph. FINDINGS: Normal cardiac silhouette. Aortic atherosclerosis with calcification. Pulmonary venous hypertension. No focal consolidation. No pleural effusion or pneumothorax. No acute osseous abnormality is evident. IMPRESSION: Pulmonary venous hypertension. No focal consolidation. Aortic Atherosclerosis (ICD10-I70.0). Electronically Signed   By: Kristine Garbe M.D.   On: 10/01/2018 03:01   Dg Hip Unilat  With Pelvis 2-3 Views Right  Result Date: 10/01/2018 CLINICAL DATA:  67 y/o  F; right hip pain after fall. EXAM: DG HIP (WITH OR WITHOUT PELVIS) 2-3V RIGHT COMPARISON:  None. FINDINGS: Acute intertrochanteric mildly displaced and angulated fracture of the right proximal femur. No hip joint dislocation. No pelvic fracture or diastasis identified. Left proximal femur and hip joint is well maintained. Partially visualized lower lumbar spine fusion hardware. Vascular calcifications noted. IMPRESSION: Acute intertrochanteric mildly displaced and angulated  fracture of the right proximal femur. Electronically Signed   By: Kristine Garbe M.D.   On: 10/01/2018 02:58     CBC Recent Labs  Lab 10/01/18 0253  WBC 7.2  HGB 20.7*  HCT 61.3*  PLT  212  MCV 102.5*  MCH 34.7*  MCHC 33.8  RDW 16.8*  LYMPHSABS 3.6  MONOABS 0.7  EOSABS 0.1  BASOSABS 0.1    Chemistries  Recent Labs  Lab 10/01/18 0253 10/01/18 1047  NA 131*  --   K 2.8* 3.5  CL 84*  --   CO2 30  --   GLUCOSE 89  --   BUN 8  --   CREATININE 0.58  --   CALCIUM 9.0  --   MG 2.1  --    ------------------------------------------------------------------------------------------------------------------ estimated creatinine clearance is 58 mL/min (by C-G formula based on SCr of 0.58 mg/dL). ------------------------------------------------------------------------------------------------------------------ No results for input(s): HGBA1C in the last 72 hours. ------------------------------------------------------------------------------------------------------------------ No results for input(s): CHOL, HDL, LDLCALC, TRIG, CHOLHDL, LDLDIRECT in the last 72 hours. ------------------------------------------------------------------------------------------------------------------ Recent Labs    10/01/18 0253  TSH 1.108   ------------------------------------------------------------------------------------------------------------------ No results for input(s): VITAMINB12, FOLATE, FERRITIN, TIBC, IRON, RETICCTPCT in the last 72 hours.  Coagulation profile Recent Labs  Lab 10/01/18 0253  INR 0.88    No results for input(s): DDIMER in the last 72 hours.  Cardiac Enzymes No results for input(s): CKMB, TROPONINI, MYOGLOBIN in the last 168 hours.  Invalid input(s): CK ------------------------------------------------------------------------------------------------------------------ Invalid input(s): [place]   This is a 67 year old female  admitted for hip fracture. 1.  Hip fracture: Right intertrochanteric regulated fracture of the proximal femur.  Plan for today N.p.o. for surgery.    Continue pain control 2.  Hypokalemia: Potassium was checked potassium now 3.5  3.  Polycythemia: Possible etiology is mild hypoxemia due to chronic lung disease.  And/or dehydration continue IV fluids  4.  Tobacco abuse: Counseled on smoking cessation.  NicoDerm patch while hospitalized.  Patient recommended strongly to stop smoking 4 minutes spent 5.  Possible alcohol abuse: Ethanol level pending.  CIWA scale as needed 6.  DVT prophylaxis: SCDs 7.  GI prophylaxis: None       Code Status Orders  (From admission, onward)         Start     Ordered   10/01/18 0459  Full code  Continuous     10/01/18 0458        Code Status History    Date Active Date Inactive Code Status Order ID Comments User Context   05/13/2018 0653 05/16/2018 1626 Full Code [id number]  Radene Gunning, NP ED           Consults orthopedic  DVT Prophylaxis SCDs  Lab Results  Component Value Date   PLT 212 10/01/2018     Time Spent in minutes 45 minutes 8 AM to 8:45 AM  Greater than 50% of time spent in care coordination and counseling patient regarding the condition and plan of care.   Dustin Flock M.D on 10/01/2018 at 1:06 PM  Between 7am to 6pm - Pager - [phone]  After 6pm go to [email or web address] - Proofreader  [employer]   Office  [phone]

## 2018-10-01 NOTE — Anesthesia Preprocedure Evaluation (Addendum)
Anesthesia Evaluation  Patient identified by MRN, date of birth, ID band Patient awake    Reviewed: Allergy & Precautions, H&P , NPO status , Patient's Chart, lab work & pertinent test results  Airway Mallampati: III  TM Distance: >3 FB Neck ROM: limited    Dental  (+) Poor Dentition   Pulmonary pneumonia (May 2019), resolved, Current Smoker,    breath sounds clear to auscultation       Cardiovascular hypertension,  Rhythm:regular Rate:Normal     Neuro/Psych  Headaches, PSYCHIATRIC DISORDERS Anxiety Depression  Neuromuscular disease (chronic low back pain on opioid therapy, sciatica) negative psych ROS   GI/Hepatic negative GI ROS, Neg liver ROS, GERD  Controlled,  Endo/Other  negative endocrine ROS  Renal/GU Renal disease (stone)     Musculoskeletal  (+) Arthritis ,   Abdominal   Peds  Hematology negative hematology ROS (+) anemia ,   Anesthesia Other Findings Multiple previous low back surgeries - pt reports fusion from at least L3-S1 Chronic pain - pt reports she takes morphine 30 mg ER BID and morphine 15 mg IR 5x/day for her low back pain +ETOH abuse +tob abuse  Past Medical History: No date: Acute cystitis No date: Acute respiratory failure (HCC) No date: Allergy     Comment:  as a child grew out of them No date: Anemia     Comment:  pernicious anemia No date: Anxiety No date: Arthritis No date: Back pain No date: Blood transfusion No date: CAP (community acquired pneumonia) No date: Chronic pain syndrome No date: Depression No date: Diverticulosis of colon No date: Dysthymia No date: Esophagitis No date: EtOH dependence (HCC) No date: Gastritis No date: GERD (gastroesophageal reflux disease) Dec. 2013: H/O chest pain     Comment:  no work up done No date: Hx of colonic polyps No date: Hypertension No date: Irritable bowel syndrome No date: Osteopenia No date: Osteoporosis 2019:  Pneumonia No date: Polypharmacy No date: Reflux esophagitis 12/22/2017: Right sided sciatica No date: Tobacco use disorder No date: Unspecified chronic bronchitis (HCC) No date: Vertigo No date: Vitamin B12 deficiency No date: Wears glasses  Past Surgical History: No date: APPENDECTOMY 10-09: BACK SURGERY     Comment:  Dr. Patrice Paradise 08/14/2014: CARPAL TUNNEL RELEASE; Right     Comment:  Procedure: RIGHT CARPAL TUNNEL RELEASE AND INJECT LEFT               THUMB;  Surgeon: Daryll Brod, MD;  Location: [place];  Service: Orthopedics;  Laterality:               Right; No date: COLONOSCOPY 02/21/2013: LAPAROSCOPY; N/A     Comment:  Procedure: LAPAROSCOPY OPERATIVE;  Surgeon: Margarette Asal, MD;  Location: [facility];  Service: Gynecology;                Laterality: N/A;  REQUESTING 5MM SCOPE WITH CAMERA No date: TONSILLECTOMY No date: TUBAL LIGATION No date: UPPER GASTROINTESTINAL ENDOSCOPY  BMI    Body Mass Index:  22.26 kg/m      Reproductive/Obstetrics negative OB ROS                            Anesthesia Physical Anesthesia Plan  ASA: III  Anesthesia Plan: General ETT   Post-op  Pain Management:    Induction:   PONV Risk Score and Plan: Ondansetron, Dexamethasone and Midazolam  Airway Management Planned:   Additional Equipment:   Intra-op Plan:   Post-operative Plan:   Informed Consent: I have reviewed the patients History and Physical, chart, labs and discussed the procedure including the risks, benefits and alternatives for the proposed anesthesia with the patient or authorized representative who has indicated his/her understanding and acceptance.   Dental Advisory Given  Plan Discussed with: Anesthesiologist, CRNA and Surgeon  Anesthesia Plan Comments:        Anesthesia Quick Evaluation

## 2018-10-01 NOTE — Transfer of Care (Signed)
Immediate Anesthesia Transfer of Care Note  Patient: Denise King  Procedure(s) Performed: INTRAMEDULLARY (IM) NAIL INTERTROCHANTRIC (Right )  Patient Location: PACU  Anesthesia Type:General  Level of Consciousness: drowsy  Airway & Oxygen Therapy: Patient Spontanous Breathing and Patient connected to face mask oxygen  Post-op Assessment: Report given to RN and Post -op Vital signs reviewed and stable  Post vital signs: Reviewed and stable  Last Vitals:  Vitals Value Taken Time  BP 118/76 10/01/2018  2:51 PM  Temp 36.8 C 10/01/2018  2:50 PM  Pulse 106 10/01/2018  2:52 PM  Resp 13 10/01/2018  2:52 PM  SpO2 98 % 10/01/2018  2:52 PM  Vitals shown include unvalidated device data.  Last Pain:  Vitals:   10/01/18 0933  TempSrc:   PainSc: 10-Worst pain ever         Complications: No apparent anesthesia complications

## 2018-10-01 NOTE — Progress Notes (Signed)
Patient is 67 year old with history of tobacco use disorder, essential hypertension,, GERD, alcohol dependence who presents after a fall.  She is noted to have severe hypokalemia. Patient has no cardiopulmonary symptoms.  Once her potassium is stable and threshold for potassium is reach per anesthesiology due to procedure patient may be able to proceed to surgery.

## 2018-10-02 LAB — BASIC METABOLIC PANEL
Anion gap: 7 (ref 5–15)
BUN: 10 mg/dL (ref 8–23)
CO2: 29 mmol/L (ref 22–32)
Calcium: 7.1 mg/dL — ABNORMAL LOW (ref 8.9–10.3)
Chloride: 98 mmol/L (ref 98–111)
Creatinine, Ser: 0.7 mg/dL (ref 0.44–1.00)
GFR calc Af Amer: >60 mL/min (ref >60)
GFR calc non Af Amer: >60 mL/min (ref >60)
Glucose, Bld: 128 mg/dL — ABNORMAL HIGH (ref 70–99)
Potassium: 3.5 mmol/L (ref 3.5–5.1)
Sodium: 134 mmol/L — ABNORMAL LOW (ref 135–145)

## 2018-10-02 LAB — CBC
HCT: 49 % — ABNORMAL HIGH (ref 35.0–47.0)
Hemoglobin: 16.4 g/dL — ABNORMAL HIGH (ref 12.0–16.0)
MCH: 34.5 pg — ABNORMAL HIGH (ref 26.0–34.0)
MCHC: 33.6 g/dL (ref 32.0–36.0)
MCV: 102.6 fL — ABNORMAL HIGH (ref 80.0–100.0)
Platelets: 156 10*3/uL (ref 150–440)
RBC: 4.77 MIL/uL (ref 3.80–5.20)
RDW: 16.2 % — ABNORMAL HIGH (ref 11.5–14.5)
WBC: 9.5 10*3/uL (ref 3.6–11.0)

## 2018-10-02 LAB — HEMOGLOBIN A1C
Hgb A1c MFr Bld: 5.2 % (ref 4.8–5.6)
Mean Plasma Glucose: 103 mg/dL

## 2018-10-02 MED ORDER — HYOSCYAMINE SULFATE 0.125 MG PO TBDP
0.1250 mg | ORAL_TABLET | ORAL | Status: DC | PRN
  Administered 2018-10-03 – 2018-10-04 (×3): 0.125 mg via ORAL
  Filled 2018-10-02 (×4): qty 1

## 2018-10-02 MED ORDER — NICOTINE 21 MG/24HR TD PT24
21.0000 mg | MEDICATED_PATCH | Freq: Every day | TRANSDERMAL | Status: DC
  Administered 2018-10-02 – 2018-10-05 (×4): 21 mg via TRANSDERMAL
  Filled 2018-10-02 (×4): qty 1

## 2018-10-02 MED ORDER — MORPHINE SULFATE ER 15 MG PO TBCR
15.0000 mg | EXTENDED_RELEASE_TABLET | Freq: Three times a day (TID) | ORAL | Status: DC
  Administered 2018-10-02 – 2018-10-03 (×2): 15 mg via ORAL
  Filled 2018-10-02 (×2): qty 1

## 2018-10-02 MED ORDER — MORPHINE SULFATE ER 30 MG PO TBCR: 30.0000 mg | EXTENDED_RELEASE_TABLET | Freq: Three times a day (TID) | ORAL | Status: DC

## 2018-10-02 NOTE — Progress Notes (Signed)
Subjective:  POD #1 s/p intramedullary fixation of right intertrochanteric hip fracture.  Patient reports right hip pain as mild to moderate at rest.  Pain increases with movement.  Objective:   VITALS:   Vitals:   10/01/18 1953 10/01/18 2356 10/02/18 0524 10/02/18 0743  BP: 134/78 128/77 105/73 114/66  Pulse: 94 93 99 98  Resp: 16 16 16    Temp: 98.8 F (37.1 C) 99.1 F (37.3 C) 98.9 F (37.2 C) 98.7 F (37.1 C)  TempSrc: Oral Oral Oral Oral  SpO2: 97% 98% 97% 95%  Weight:      Height:        PHYSICAL EXAM: Right hip: Neurovascular intact Sensation intact distally Intact pulses distally Dorsiflexion/Plantar flexion intact Incision: scant drainage on bandage No cellulitis present Compartment soft  LABS  Results for orders placed or performed during the hospital encounter of 10/01/18 (from the past 24 hour(s))  Potassium     Status: None   Collection Time: 10/01/18 10:47 AM  Result Value Ref Range   Potassium 3.5 3.5 - 5.1 mmol/L  Basic metabolic panel     Status: Abnormal   Collection Time: 10/02/18  3:46 AM  Result Value Ref Range   Sodium 134 (L) 135 - 145 mmol/L   Potassium 3.5 3.5 - 5.1 mmol/L   Chloride 98 98 - 111 mmol/L   CO2 29 22 - 32 mmol/L   Glucose, Bld 128 (H) 70 - 99 mg/dL   BUN 10 8 - 23 mg/dL   Creatinine, Ser 0.70 0.44 - 1.00 mg/dL   Calcium 7.1 (L) 8.9 - 10.3 mg/dL   GFR calc non Af Amer >60 >60 mL/min   GFR calc Af Amer >60 >60 mL/min   Anion gap 7 5 - 15  CBC     Status: Abnormal   Collection Time: 10/02/18  3:46 AM  Result Value Ref Range   WBC 9.5 3.6 - 11.0 K/uL   RBC 4.77 3.80 - 5.20 MIL/uL   Hemoglobin 16.4 (H) 12.0 - 16.0 g/dL   HCT 49.0 (H) 35.0 - 47.0 %   MCV 102.6 (H) 80.0 - 100.0 fL   MCH 34.5 (H) 26.0 - 34.0 pg   MCHC 33.6 32.0 - 36.0 g/dL   RDW 16.2 (H) 11.5 - 14.5 %   Platelets 156 150 - 440 K/uL    Dg Chest 1 View  Result Date: 10/01/2018 CLINICAL DATA:  67 y/o F; right hip pain after fall. History of  hypertension and pneumonia. EXAM: CHEST  1 VIEW COMPARISON:  05/15/2018 chest radiograph. FINDINGS: Normal cardiac silhouette. Aortic atherosclerosis with calcification. Pulmonary venous hypertension. No focal consolidation. No pleural effusion or pneumothorax. No acute osseous abnormality is evident. IMPRESSION: Pulmonary venous hypertension. No focal consolidation. Aortic Atherosclerosis (ICD10-I70.0). Electronically Signed   By: Kristine Garbe M.D.   On: 10/01/2018 03:01   Dg Hip Port Unilat With Pelvis 1v Right  Result Date: 10/01/2018 CLINICAL DATA:  Right hip ORIF EXAM: DG HIP (WITH OR WITHOUT PELVIS) 1V PORT RIGHT COMPARISON:  10/01/2018 FINDINGS: Internal fixation across the right femoral intertrochanteric fracture. Anatomic alignment. No hardware complicating feature. IMPRESSION: Internal fixation across the right femoral intertrochanteric fracture with anatomic alignment and no visible complicating feature. Electronically Signed   By: Rolm Baptise M.D.   On: 10/01/2018 15:44   Dg Hip Operative Unilat W Or W/o Pelvis Right  Result Date: 10/01/2018 CLINICAL DATA:  IM nailing RIGHT hip EXAM: OPERATIVE RIGHT HIP (WITH PELVIS IF PERFORMED) 4 VIEWS TECHNIQUE: Fluoroscopic  spot image(s) were submitted for interpretation post-operatively. COMPARISON:  10/01/2018 RIGHT hip radiographs 1 minutes 8 seconds Images obtained: 4 Dose: 9.09 mGy FINDINGS: IM nail with compression screw placed across reduced intertrochanteric fracture of RIGHT femur. Bones demineralized. No new fracture, dislocation or bone destruction. Scattered atherosclerotic calcifications. IMPRESSION: Post ORIF of a reduced intertrochanteric fracture of the RIGHT femur. Electronically Signed   By: Lavonia Dana M.D.   On: 10/01/2018 15:08   Dg Hip Unilat  With Pelvis 2-3 Views Right  Result Date: 10/01/2018 CLINICAL DATA:  67 y/o  F; right hip pain after fall. EXAM: DG HIP (WITH OR WITHOUT PELVIS) 2-3V RIGHT COMPARISON:  None.  FINDINGS: Acute intertrochanteric mildly displaced and angulated fracture of the right proximal femur. No hip joint dislocation. No pelvic fracture or diastasis identified. Left proximal femur and hip joint is well maintained. Partially visualized lower lumbar spine fusion hardware. Vascular calcifications noted. IMPRESSION: Acute intertrochanteric mildly displaced and angulated fracture of the right proximal femur. Electronically Signed   By: Kristine Garbe M.D.   On: 10/01/2018 02:58    Assessment/Plan: 1 Day Post-Op   Active Problems:   Hip fracture (HCC)  Patient stable post-op.  Hemaglobin is 16.4 and hct is 49.  WBC is 9.5.  Patient will begin PT today.  She will start lovenox today for DVT prophylaxis.  Foley out on POD #1.  Continue current pain management.    Thornton Park , MD 10/02/2018, 8:47 AM

## 2018-10-02 NOTE — Progress Notes (Signed)
[place] at [facility]                                                                                                                                                                                  Patient Demographics   Denise King, is a 67 y.o. female, DOB - 1951-04-08, JXB:[id number]  Admit date - 10/01/2018   Admitting Physician Harrie Foreman, MD  Outpatient Primary MD for the patient is Biagio Borg, MD   LOS - 1  Subjective: Pt c/o pain in hip No sob, no polycythemia   Review of Systems:   CONSTITUTIONAL: No documented fever. No fatigue, weakness. No weight gain, no weight loss.  EYES: No blurry or double vision.  ENT: No tinnitus. No postnasal drip. No redness of the oropharynx.  RESPIRATORY: No cough, no wheeze, no hemoptysis. No dyspnea.  CARDIOVASCULAR: No chest pain. No orthopnea. No palpitations. No syncope.  GASTROINTESTINAL: No nausea, no vomiting or diarrhea. No abdominal pain. No melena or hematochezia.  GENITOURINARY: No dysuria or hematuria.  ENDOCRINE: No polyuria or nocturia. No heat or cold intolerance.  HEMATOLOGY: No anemia. No bruising. No bleeding.  INTEGUMENTARY: No rashes. No lesions.  MUSCULOSKELETAL: No arthritis. No swelling. No gout.  NEUROLOGIC: No numbness, tingling, or ataxia. No seizure-type activity.  PSYCHIATRIC: No anxiety. No insomnia. No ADD.    Vitals:   Vitals:   10/02/18 0524 10/02/18 0743 10/02/18 0948 10/02/18 1129  BP: 105/73 114/66  123/76  Pulse: 99 98 (!) 101 98  Resp: 16     Temp: 98.9 F (37.2 C) 98.7 F (37.1 C)  98.4 F (36.9 C)  TempSrc: Oral Oral  Oral  SpO2: 97% 95% 92% 96%  Weight:      Height:        Wt Readings from Last 3 Encounters:  10/01/18 57 kg  08/31/18 55 kg  06/24/18 59 kg     Intake/Output Summary (Last 24 hours) at 10/02/2018 1331 Last data filed at 10/02/2018 0900 Gross per 24 hour  Intake 1040 ml  Output 1255 ml  Net -215 ml    Physical Exam:    GENERAL: Pleasant-appearing in no apparent distress.  HEAD, EYES, EARS, NOSE AND THROAT: Atraumatic, normocephalic. Extraocular muscles are intact. Pupils equal and reactive to light. Sclerae anicteric. No conjunctival injection. No oro-pharyngeal erythema.  NECK: Supple. There is no jugular venous distention. No bruits, no lymphadenopathy, no thyromegaly.  HEART: Regular rate and rhythm,. No murmurs, no rubs, no clicks.  LUNGS: Clear to auscultation bilaterally. No rales or rhonchi. No wheezes.  ABDOMEN: Soft, flat, nontender, nondistended. Has good bowel sounds. No hepatosplenomegaly appreciated.  EXTREMITIES: No evidence  of any cyanosis, clubbing, or peripheral edema.  +2 pedal and radial pulses bilaterally.  NEUROLOGIC: The patient is alert, awake, and oriented x3 with no focal motor or sensory deficits appreciated bilaterally.  SKIN: Moist and warm with no rashes appreciated.  Psych: Not anxious, depressed LN: No inguinal LN enlargement    Antibiotics   Anti-infectives (From admission, onward)   Start     Dose/Rate Route Frequency Ordered Stop   10/02/18 0130  vancomycin (VANCOCIN) IVPB 1000 mg/200 mL premix     1,000 mg 200 mL/hr over 60 Minutes Intravenous Every 12 hours 10/01/18 1557 10/02/18 0217   10/01/18 1030  vancomycin (VANCOCIN) IVPB 1000 mg/200 mL premix     1,000 mg 200 mL/hr over 60 Minutes Intravenous  Once 10/01/18 1028 10/01/18 1437      Medications   Scheduled Meds: . acetaminophen  1,000 mg Oral Q6H  . docusate sodium  100 mg Oral BID  . DULoxetine  60 mg Oral Daily  . enoxaparin (LOVENOX) injection  40 mg Subcutaneous Q24H  . ferrous sulfate  325 mg Oral TID PC  . folic acid  1 mg Oral Daily  . gabapentin  300 mg Oral TID  . ketorolac  7.5 mg Intravenous Q6H  . lidocaine  1 patch Transdermal Q24H  . LORazepam  0-4 mg Intravenous Q6H   Followed by  . [START ON 10/03/2018] LORazepam  0-4 mg Intravenous Q12H  . morphine  15 mg Oral Q8H  . multivitamin  with minerals  1 tablet Oral Daily  . pantoprazole  40 mg Oral BID  . thiamine  100 mg Oral Daily   Or  . thiamine  100 mg Intravenous Daily  . traMADol  50 mg Oral Q6H   Continuous Infusions: . 0.9 % NaCl with KCl 20 mEq / L 75 mL/hr at 10/01/18 1847  . methocarbamol (ROBAXIN) IV     PRN Meds:.acetaminophen, alum & mag hydroxide-simeth, bisacodyl, clonazePAM, diphenoxylate-atropine, HYDROmorphone (DILAUDID) injection, LORazepam **OR** LORazepam, magnesium citrate, menthol-cetylpyridinium **OR** phenol, methocarbamol **OR** methocarbamol (ROBAXIN) IV, metoCLOPramide **OR** metoCLOPramide (REGLAN) injection, ondansetron **OR** ondansetron (ZOFRAN) IV, oxyCODONE, oxyCODONE, polyethylene glycol   Data Review:   Micro Results Recent Results (from the past 240 hour(s))  Surgical pcr screen     Status: None   Collection Time: 10/01/18  7:07 AM  Result Value Ref Range Status   MRSA, PCR NEGATIVE NEGATIVE Final   Staphylococcus aureus NEGATIVE NEGATIVE Final    Comment: (NOTE) The Xpert SA Assay (FDA approved for NASAL specimens in patients 37 years of age and older), is one component of a comprehensive surveillance program. It is not intended to diagnose infection nor to guide or monitor treatment. Performed at [facility], [address], [place], [place] [address]     Radiology Reports Dg Chest 1 View  Result Date: 10/01/2018 CLINICAL DATA:  67 y/o F; right hip pain after fall. History of hypertension and pneumonia. EXAM: CHEST  1 VIEW COMPARISON:  05/15/2018 chest radiograph. FINDINGS: Normal cardiac silhouette. Aortic atherosclerosis with calcification. Pulmonary venous hypertension. No focal consolidation. No pleural effusion or pneumothorax. No acute osseous abnormality is evident. IMPRESSION: Pulmonary venous hypertension. No focal consolidation. Aortic Atherosclerosis (ICD10-I70.0). Electronically Signed   By: Kristine Garbe M.D.   On: 10/01/2018 03:01   Dg  Hip Port Unilat With Pelvis 1v Right  Result Date: 10/01/2018 CLINICAL DATA:  Right hip ORIF EXAM: DG HIP (WITH OR WITHOUT PELVIS) 1V PORT RIGHT COMPARISON:  10/01/2018 FINDINGS: Internal fixation  across the right femoral intertrochanteric fracture. Anatomic alignment. No hardware complicating feature. IMPRESSION: Internal fixation across the right femoral intertrochanteric fracture with anatomic alignment and no visible complicating feature. Electronically Signed   By: Rolm Baptise M.D.   On: 10/01/2018 15:44   Dg Hip Operative Unilat W Or W/o Pelvis Right  Result Date: 10/01/2018 CLINICAL DATA:  IM nailing RIGHT hip EXAM: OPERATIVE RIGHT HIP (WITH PELVIS IF PERFORMED) 4 VIEWS TECHNIQUE: Fluoroscopic spot image(s) were submitted for interpretation post-operatively. COMPARISON:  10/01/2018 RIGHT hip radiographs 1 minutes 8 seconds Images obtained: 4 Dose: 9.09 mGy FINDINGS: IM nail with compression screw placed across reduced intertrochanteric fracture of RIGHT femur. Bones demineralized. No new fracture, dislocation or bone destruction. Scattered atherosclerotic calcifications. IMPRESSION: Post ORIF of a reduced intertrochanteric fracture of the RIGHT femur. Electronically Signed   By: Lavonia Dana M.D.   On: 10/01/2018 15:08   Dg Hip Unilat  With Pelvis 2-3 Views Right  Result Date: 10/01/2018 CLINICAL DATA:  67 y/o  F; right hip pain after fall. EXAM: DG HIP (WITH OR WITHOUT PELVIS) 2-3V RIGHT COMPARISON:  None. FINDINGS: Acute intertrochanteric mildly displaced and angulated fracture of the right proximal femur. No hip joint dislocation. No pelvic fracture or diastasis identified. Left proximal femur and hip joint is well maintained. Partially visualized lower lumbar spine fusion hardware. Vascular calcifications noted. IMPRESSION: Acute intertrochanteric mildly displaced and angulated fracture of the right proximal femur. Electronically Signed   By: Kristine Garbe M.D.   On: 10/01/2018 02:58      CBC Recent Labs  Lab 10/01/18 0253 10/02/18 0346  WBC 7.2 9.5  HGB 20.7* 16.4*  HCT 61.3* 49.0*  PLT 212 156  MCV 102.5* 102.6*  MCH 34.7* 34.5*  MCHC 33.8 33.6  RDW 16.8* 16.2*  LYMPHSABS 3.6  --   MONOABS 0.7  --   EOSABS 0.1  --   BASOSABS 0.1  --     Chemistries  Recent Labs  Lab 10/01/18 0253 10/01/18 1047 10/02/18 0346  NA 131*  --  134*  K 2.8* 3.5 3.5  CL 84*  --  98  CO2 30  --  29  GLUCOSE 89  --  128*  BUN 8  --  10  CREATININE 0.58  --  0.70  CALCIUM 9.0  --  7.1*  MG 2.1  --   --    ------------------------------------------------------------------------------------------------------------------ estimated creatinine clearance is 58 mL/min (by C-G formula based on SCr of 0.7 mg/dL). ------------------------------------------------------------------------------------------------------------------ Recent Labs    10/01/18 0253  HGBA1C 5.2   ------------------------------------------------------------------------------------------------------------------ No results for input(s): CHOL, HDL, LDLCALC, TRIG, CHOLHDL, LDLDIRECT in the last 72 hours. ------------------------------------------------------------------------------------------------------------------ Recent Labs    10/01/18 0253  TSH 1.108   ------------------------------------------------------------------------------------------------------------------ No results for input(s): VITAMINB12, FOLATE, FERRITIN, TIBC, IRON, RETICCTPCT in the last 72 hours.  Coagulation profile Recent Labs  Lab 10/01/18 0253  INR 0.88    No results for input(s): DDIMER in the last 72 hours.  Cardiac Enzymes No results for input(s): CKMB, TROPONINI, MYOGLOBIN in the last 168 hours.  Invalid input(s): CK ------------------------------------------------------------------------------------------------------------------ Invalid input(s): [facility]   This is a 67 year old female  admitted for hip fracture. 1.  Hip fracture: Right intertrochanteric regulated fracture of the proximal femur.  S/p repair 2.  Hypokalemia: Potassium was checked potassium now 3.5  3.  Polycythemia:improved with iv hydration  4.  Tobacco abuse: Counseled on smoking cessation.  NicoDerm patch while hospitalized.  Patient recommended strongly to  stop smoking 4 minutes spent 5.  Possible alcohol abuse: Ethanol level pending.  CIWA scale as needed 6.  DVT prophylaxis: SCDs 7.  GI prophylaxis: None       Code Status Orders  (From admission, onward)         Start     Ordered   10/01/18 0459  Full code  Continuous     10/01/18 0458        Code Status History    Date Active Date Inactive Code Status Order ID Comments User Context   05/13/2018 0653 05/16/2018 1626 Full Code [id number]  Radene Gunning, NP ED           Consults orthopedic  DVT Prophylaxis SCDs  Lab Results  Component Value Date   PLT 156 10/02/2018     Time Spent in minutes 28min  Greater than 50% of time spent in care coordination and counseling patient regarding the condition and plan of care.   Dustin Flock M.D on 10/02/2018 at 1:31 PM  Between 7am to 6pm - Pager - [phone]  After 6pm go to [email or web address] - Proofreader  [employer]   Office  [phone]

## 2018-10-02 NOTE — Progress Notes (Signed)
Pt complaining of stomach cramps from her [name] she states that hycosamine is the only thing that helps.  Spoke with Dr. Jannifer Franklin MD to place orders.

## 2018-10-02 NOTE — Progress Notes (Signed)
Pt remaining alert and oriented. Pain control with oral pain medication. Iv infusing without difficulty. Scant amount of drainage to surgical dressing. Foley patent and draining urine. CIWA assessment remaining negative.
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
## 2018-10-02 NOTE — Evaluation (Signed)
Physical Therapy Evaluation Patient Details Name: Denise King MRN: [id number] DOB: 19-May-1951 Today's Date: 10/02/2018   History of Present Illness  Denise King is a 67yo female who comes to [facility] on 10/5 after sustaining a fall at home, and right hip pain. Pt underwent Rt hip ORIF c IM nail on 10/5. PMH: tobacco use, chronic pain c opite dependence, depression, vertigo, GERD, and lumbar fusion L4-S1. PTA pt AMB mostly limited household distances without AD, reports 2 falls in past 3 months.   Clinical Impression  Pt admitted with above diagnosis. Pt currently with functional limitations due to the deficits listed below (see "PT Problem List"). Upon entry, pt in bed, daughter present. The pt is easily made awake and agreeable to participate. The pt is alert and oriented x3, pleasant, conversational, and following simple commands consistently. Resting pain is 8/10, increased to 9/10 with gentle exercises in bed. ROM of the operative limb is extremely limited at this time. Functional mobility not tolerated very well d/t pain at this time, with nursing demonstrates poor tolerance and need for physical assistance, whereas the patient performed these at a higher level of independence PTA. Pt will benefit from skilled PT intervention to increase independence and safety with basic mobility in preparation for discharge to the venue listed below.       Follow Up Recommendations SNF;Supervision/Assistance - 24 hour    Equipment Recommendations       Recommendations for Other Services       Precautions / Restrictions Precautions Precautions: Fall Restrictions Weight Bearing Restrictions: Yes RLE Weight Bearing: Weight bearing as tolerated      Mobility  Bed Mobility               General bed mobility comments: pt refuses OOB, 9/10 pain with gentle short-arc AA/ROM in bed.   Transfers                    Ambulation/Gait                Stairs             Wheelchair Mobility    Modified Rankin (Stroke Patients Only)       Balance                                             Pertinent Vitals/Pain Pain Assessment: 0-10 Pain Score: 8  Pain Location: Right hip, increases to 9/10 with gentle ROM  Pain Descriptors / Indicators: Burning Pain Intervention(s): Limited activity within patient's tolerance;Monitored during session;Patient requesting pain meds-RN notified;Repositioned    Home Living Family/patient expects to be discharged to:: Private residence Living Arrangements: Spouse/significant other Available Help at Discharge: Family;Available 24 hours/day Type of Home: House Home Access: Stairs to enter Entrance Stairs-Rails: None Entrance Stairs-Number of Steps: 3 Home Layout: One level Home Equipment: Walker - 2 wheels;Cane - single point;Shower seat;Other (comment)      Prior Function Level of Independence: Needs assistance      ADL's / Homemaking Assistance Needed: Husband was helping with cooking/cleaning/groceries due to pain and decreased endurance; Independent with dressing/bathing/grooming; difficulty with LB dressing        Hand Dominance   Dominant Hand: Right    Extremity/Trunk Assessment        Lower Extremity Assessment Lower Extremity Assessment: Generalized weakness       Communication  Cognition Arousal/Alertness: Lethargic Behavior During Therapy: WFL for tasks assessed/performed Overall Cognitive Status: Within Functional Limits for tasks assessed                                        General Comments      Exercises General Exercises - Lower Extremity Ankle Circles/Pumps: AROM;20 reps;Supine Short Arc Quad: 10 reps;PROM;Right;Left;AROM;Supine Heel Slides: (attempted, but not tolerated on Right) Hip ABduction/ADduction: AAROM;Both;10 reps(poor tolerance on RLE, limited to 15 degrees or less)   Assessment/Plan    PT Assessment Patient  needs continued PT services  PT Problem List Decreased activity tolerance;Decreased mobility;Decreased knowledge of precautions;Cardiopulmonary status limiting activity;Pain       PT Treatment Interventions DME instruction;Gait training;Stair training;Functional mobility training;Therapeutic activities;Therapeutic exercise;Patient/family education    PT Goals (Current goals can be found in the Care Plan section)  Acute Rehab PT Goals Patient Stated Goal: regain independent mobility, reduce pain PT Goal Formulation: With patient Time For Goal Achievement: 10/16/18 Potential to Achieve Goals: Good    Frequency BID   Barriers to discharge Decreased caregiver support;Inaccessible home environment 3 steps to enter, and both husband/daughter work during the day    Co-evaluation               AM-PAC PT "6 Clicks" Daily Activity  Outcome Measure Difficulty turning over in bed (including adjusting bedclothes, sheets and blankets)?: Unable Difficulty moving from lying on back to sitting on the side of the bed? : Unable Difficulty sitting down on and standing up from a chair with arms (e.g., wheelchair, bedside commode, etc,.)?: Unable Help needed moving to and from a bed to chair (including a wheelchair)?: Total Help needed walking in hospital room?: Total Help needed climbing 3-5 steps with a railing? : Total 6 Click Score: 6    End of Session Equipment Utilized During Treatment: Oxygen Activity Tolerance: Patient limited by lethargy;Patient limited by pain Patient left: in bed;with family/visitor present;with call bell/phone within reach;with SCD's reapplied(chaired out position. ) Nurse Communication: Mobility status PT Visit Diagnosis: Difficulty in walking, not elsewhere classified (R26.2)    Time: 3299-2426 PT Time Calculation (min) (ACUTE ONLY): 26 min   Charges:   PT Evaluation $PT Eval Moderate Complexity: 1 Mod PT Treatments $Therapeutic Exercise: 8-22 mins        11:17 AM, 10/02/18 Etta Grandchild, PT, DPT Physical Therapist - [facility]  [phone] ([place])     Bernestine Holsapple C 10/02/2018, 11:15 AM

## 2018-10-02 NOTE — NC FL2 (Signed)
Denise LEVEL OF CARE SCREENING TOOL     King  Patient Name: Denise King Birthdate: 1951/05/03 Sex: female Admission Date (Current Location): 10/01/2018  [place] and [place] Number:  Engineering geologist and Address:  [facility], [address], [place], [place] [address]      Provider Number: [id number]  Attending Physician Name and Address:  Dustin Flock, MD  Relative Name and Phone Number:  Kahlia Lagunes III (Spouse) [phone] or Doreene Burke (Daughter) [phone]    Current Level of Care: Hospital Recommended Level of Care: [place] Prior Approval Number:    Date Approved/Denied:   PASRR Number: [id number]  Discharge Plan: SNF    Current Diagnoses: Patient Active Problem List   Diagnosis Date Noted  . Hip fracture ([place]) 10/01/2018  . Dysuria 05/20/2018  . Right leg swelling 05/20/2018  . Acute respiratory failure ([place]) 05/13/2018  . CAP (community acquired pneumonia) 05/13/2018  . Tachycardia 05/13/2018  . Chest wall pain 05/13/2018  . Narcotic poisoning ([place]) 05/13/2018  . EtOH dependence ([place])   . Polypharmacy   . Esophagitis   . Encounter for well adult exam with abnormal findings 12/22/2017  . Polycythemia 12/22/2017  . Depression 12/22/2017  . Hyperglycemia 12/22/2017  . Right sided sciatica 12/22/2017  . Nausea & vomiting 07/17/2017  . Cough 06/10/2017  . Diarrhea 04/22/2016  . Family history of colon cancer - brother and father 03/10/2016  . Insomnia 02/11/2016  . Generalized anxiety disorder 11/25/2015  . Urinary frequency 04/25/2015  . Nausea and vomiting in adult 11/08/2014  . Cigarette smoker 08/15/2013  . Otitis media 10/27/2012  . Abdominal pain, generalized 03/30/2012  . HEPATIC CYST 02/18/2010  . Chronic pain syndrome 02/08/2010  . Vitamin B12 deficiency 10/04/2009  . GERD 01/04/2009  . ABDOMINAL PAIN-EPIGASTRIC 01/04/2009  . DYSTHYMIA 08/10/2008  . Venous  (peripheral) insufficiency 08/10/2008  . Irritable bowel syndrome 08/10/2008  . Back pain 08/10/2008  . CIGARETTE SMOKER 03/06/2008  . Hx of adenomatous polyp of colon 12/06/2007  . BRONCHITIS, RECURRENT 12/06/2007  . DIVERTICULOSIS OF COLON 12/06/2007  . OSTEOPENIA 12/06/2007  . CALCULUS, KIDNEY 10/07/2007  . VERTIGO 10/07/2007  . Headache(784.0) 10/07/2007    Orientation RESPIRATION BLADDER Height & Weight     Self, Situation, Time, Place  Normal Continent Weight: 125 lb 10.6 oz (57 kg) Height:  5\' 3"  (160 cm)  BEHAVIORAL SYMPTOMS/MOOD NEUROLOGICAL BOWEL NUTRITION STATUS      Continent    AMBULATORY STATUS COMMUNICATION OF NEEDS Skin   Extensive Assist Verbally Surgical wounds                       Personal Care Assistance Level of Assistance  Bathing, Feeding, Dressing Bathing Assistance: Limited assistance Feeding assistance: Independent Dressing Assistance: Limited assistance     Functional Limitations Info  Sight, Hearing, Speech Sight Info: Adequate Hearing Info: Adequate Speech Info: Adequate    SPECIAL CARE FACTORS FREQUENCY  PT (By licensed PT)     PT Frequency: Up to 5X per week              Contractures Contractures Info: Not present    Additional Factors Info  Code Status, Allergies, Psychotropic Code Status Info: Full Allergies Info: Atorvastatin, Levofloxacin, Omnicef Cefdinir, Trazodone And Nefazodone, Sulfonamide Derivatives Psychotropic Info: Klonopin, Cymbalta         Current Medications (10/02/2018):  This is the current hospital active medication list Current Facility-Administered Medications  Medication Dose  Route Frequency Provider Last Rate Last Dose  . 0.9 % NaCl with KCl 20 mEq/ L  infusion   Intravenous Continuous Thornton Park, MD   Stopped at 10/02/18 1411  . acetaminophen (TYLENOL) tablet 1,000 mg  1,000 mg Oral Q6H Thornton Park, MD   1,000 mg at 10/02/18 1208  . acetaminophen (TYLENOL) tablet 325-650 mg  325-650  mg Oral Q6H PRN Thornton Park, MD      . alum & mag hydroxide-simeth (MAALOX/MYLANTA) 200-200-20 MG/5ML suspension 30 mL  30 mL Oral Q4H PRN Thornton Park, MD      . bisacodyl (DULCOLAX) suppository 10 mg  10 mg Rectal Daily PRN Thornton Park, MD      . clonazePAM Bobbye [place]) tablet 1 mg  1 mg Oral QHS PRN Thornton Park, MD      . diphenoxylate-atropine (LOMOTIL) 2.5-0.025 MG per tablet 1 tablet  1 tablet Oral QID PRN Thornton Park, MD      . docusate sodium (COLACE) capsule 100 mg  100 mg Oral BID Thornton Park, MD   100 mg at 10/02/18 1028  . DULoxetine (CYMBALTA) DR capsule 60 mg  60 mg Oral Daily Thornton Park, MD   60 mg at 10/02/18 1026  . enoxaparin (LOVENOX) injection 40 mg  40 mg Subcutaneous Q24H Thornton Park, MD   40 mg at 10/02/18 0853  . ferrous sulfate tablet 325 mg  325 mg Oral TID Lorre Munroe, MD   325 mg at 10/02/18 1208  . folic acid (FOLVITE) tablet 1 mg  1 mg Oral Daily Thornton Park, MD   1 mg at 10/02/18 1030  . gabapentin (NEURONTIN) capsule 300 mg  300 mg Oral TID Thornton Park, MD   300 mg at 10/02/18 1029  . HYDROmorphone (DILAUDID) injection 0.5-1 mg  0.5-1 mg Intravenous Q4H PRN Thornton Park, MD      . ketorolac (TORADOL) 15 MG/ML injection 7.5 mg  7.5 mg Intravenous Q6H Thornton Park, MD   7.5 mg at 10/02/18 1208  . lidocaine (LIDODERM) 5 % 1 patch  1 patch Transdermal Q24H Thornton Park, MD   1 patch at 10/01/18 1802  . LORazepam (ATIVAN) injection 0-4 mg  0-4 mg Intravenous Q6H Thornton Park, MD       Followed by  . [START ON 10/03/2018] LORazepam (ATIVAN) injection 0-4 mg  0-4 mg Intravenous Q12H Thornton Park, MD      . LORazepam (ATIVAN) tablet 1 mg  1 mg Oral Q6H PRN Thornton Park, MD       Or  . LORazepam (ATIVAN) injection 1 mg  1 mg Intravenous Q6H PRN Thornton Park, MD      . magnesium citrate solution 1 Bottle  1 Bottle Oral Once PRN Thornton Park, MD      . menthol-cetylpyridinium (CEPACOL)  lozenge 3 mg  1 lozenge Oral PRN Thornton Park, MD       Or  . phenol (CHLORASEPTIC) mouth spray 1 spray  1 spray Mouth/Throat PRN Thornton Park, MD      . methocarbamol (ROBAXIN) tablet 500 mg  500 mg Oral Q6H PRN Thornton Park, MD       Or  . methocarbamol (ROBAXIN) 500 mg in dextrose 5 % 50 mL IVPB  500 mg Intravenous Q6H PRN Thornton Park, MD      . metoCLOPramide (REGLAN) tablet 5-10 mg  5-10 mg Oral Q8H PRN Thornton Park, MD       Or  . metoCLOPramide (REGLAN) injection 5-10 mg  5-10 mg Intravenous  Q8H PRN Thornton Park, MD      . morphine (MS CONTIN) 12 hr tablet 15 mg  15 mg Oral Q8H Dustin Flock, MD      . multivitamin with minerals tablet 1 tablet  1 tablet Oral Daily Thornton Park, MD   1 tablet at 10/02/18 1030  . ondansetron (ZOFRAN) tablet 4 mg  4 mg Oral Q6H PRN Thornton Park, MD       Or  . ondansetron [facility]) injection 4 mg  4 mg Intravenous Q6H PRN Thornton Park, MD      . oxyCODONE (Oxy IR/ROXICODONE) immediate release tablet 10-15 mg  10-15 mg Oral Q4H PRN Thornton Park, MD   10 mg at 10/02/18 0116  . oxyCODONE (Oxy IR/ROXICODONE) immediate release tablet 5-10 mg  5-10 mg Oral Q4H PRN Thornton Park, MD   10 mg at 10/02/18 1453  . pantoprazole (PROTONIX) EC tablet 40 mg  40 mg Oral BID Thornton Park, MD   40 mg at 10/02/18 1029  . polyethylene glycol (MIRALAX / GLYCOLAX) packet 17 g  17 g Oral Daily PRN Thornton Park, MD      . thiamine (VITAMIN B-1) tablet 100 mg  100 mg Oral Daily Thornton Park, MD   100 mg at 10/02/18 1028   Or  . thiamine (B-1) injection 100 mg  100 mg Intravenous Daily Thornton Park, MD      . traMADol Veatrice Bourbon) tablet 50 mg  50 mg Oral Q6H Thornton Park, MD   50 mg at 10/02/18 1208     Discharge Medications: Please see discharge summary for a list of discharge medications.  Relevant Imaging Results:  Relevant Lab Results:   Additional Information SS#[id number]  Zettie Pho, LCSW

## 2018-10-02 NOTE — Progress Notes (Signed)
Pts bladder scan now 310 ml. Prime doc paged. MD Pyreddy notified. Orders received for in and out X1 for bladder scan greater than 350 ml. Order placed.

## 2018-10-02 NOTE — Progress Notes (Signed)
Pts foley catheter was removed early this am by night shift, Pt has not voided all day. Bladder scan 210 ml. MD notified.

## 2018-10-03 ENCOUNTER — Encounter: Payer: Self-pay | Admitting: Orthopedic Surgery

## 2018-10-03 LAB — CBC
HCT: 46.6 % (ref 35.0–47.0)
Hemoglobin: 15.7 g/dL (ref 12.0–16.0)
MCH: 35.1 pg — ABNORMAL HIGH (ref 26.0–34.0)
MCHC: 33.7 g/dL (ref 32.0–36.0)
MCV: 104.3 fL — ABNORMAL HIGH (ref 80.0–100.0)
Platelets: 165 10*3/uL (ref 150–440)
RBC: 4.47 MIL/uL (ref 3.80–5.20)
RDW: 16.5 % — ABNORMAL HIGH (ref 11.5–14.5)
WBC: 9.1 10*3/uL (ref 3.6–11.0)

## 2018-10-03 MED ORDER — MORPHINE SULFATE 15 MG PO TABS
15.0000 mg | ORAL_TABLET | ORAL | Status: DC | PRN
  Administered 2018-10-03 – 2018-10-05 (×8): 15 mg via ORAL
  Filled 2018-10-03 (×8): qty 1

## 2018-10-03 MED ORDER — MORPHINE SULFATE 15 MG PO TABS: 15.0000 mg | ORAL_TABLET | ORAL | Status: DC | PRN

## 2018-10-03 MED ORDER — MORPHINE SULFATE ER 30 MG PO TBCR
30.0000 mg | EXTENDED_RELEASE_TABLET | Freq: Three times a day (TID) | ORAL | Status: DC
  Administered 2018-10-03 – 2018-10-05 (×6): 30 mg via ORAL
  Filled 2018-10-03 (×6): qty 1

## 2018-10-03 NOTE — Evaluation (Signed)
Occupational Therapy Evaluation Patient Details Name: Denise King MRN: [id number] DOB: [birth year] Today's Date: 10/03/2018    History of Present Illness Denise King is a 67yo female who comes to [facility] on 10/5 after sustaining a fall at home, and right hip pain. Pt underwent Rt hip ORIF c IM nail on 10/5. PMH: tobacco use, chronic pain c opite dependence, depression, vertigo, GERD, and lumbar fusion L4-S1. PTA pt AMB mostly limited household distances without AD, reports 2 falls in past 3 months.    Clinical Impression   Pt seen for OT evaluation this date. Pt is a 67 y/o female with a history of falls 2/2 hip and back pain. Pt agreeable to therapy however, limited to participation due to pain. 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 Pt recalled her weight bearing precaution. Currently, pt demonstrates impairments in pain, ROM, strength, and balance requiring MAX A for bed mobility which was limited by pain this session. Pt educated in falls prevention strategies and the use of DME and AE to improve pt participation in her ADLs. Pt nodded in understanding of this education. Pt will benefit from skilled OT services to address noted impairments and functional deficits in order to maximize falls risks and minimize caregiver burden. OT recommends STR upon discharge.      Follow Up Recommendations  SNF    Equipment Recommendations  3 in 1 bedside commode    Recommendations for Other Services       Precautions / Restrictions Precautions Precautions: Fall Restrictions Weight Bearing Restrictions: Yes      Mobility Bed Mobility               General bed mobility comments: Pt attempted but did not complete EOB sitting due to pain  Transfers Overall transfer level: Needs assistance Equipment used: Rolling walker (2 wheeled)                  Balance Overall balance  assessment: History of Falls                                         ADL either performed or assessed with clinical judgement   ADL Overall ADL's : Needs assistance/impaired Eating/Feeding: Independent   Grooming: Modified independent;Bed level   Upper Body Bathing: Bed level;Minimal assistance   Lower Body Bathing: Maximal assistance;Bed level   Upper Body Dressing : Bed level;Minimal assistance   Lower Body Dressing: Bed level;Maximal assistance Lower Body Dressing Details (indicate cue type and reason): pt educated with AE for LB dressing    Toilet Transfer Details (indicate cue type and reason): Unable to transfer would be bed level assist for transfer       Tub/Shower Transfer Details (indicate cue type and reason): Pt unable to transfer requires bed level assistance    General ADL Comments: Pt very limited by pain expect functional independence for ADL/mobility to improve with better pain control.      Vision Baseline Vision/History: Wears glasses Wears Glasses: At all times Patient Visual Report: No change from baseline       Perception     Praxis      Pertinent Vitals/Pain Pain Assessment: 0-10 Pain Score: 6  Pain Location: RIght hip pain and back pain. Pain level of 6 due to back pain while sitting in bed.  Pain  Descriptors / Indicators: Grimacing;Sharp Pain Intervention(s): Limited activity within patient's tolerance;Monitored during session;Premedicated before session;Repositioned     Hand Dominance     Extremity/Trunk Assessment Upper Extremity Assessment Upper Extremity Assessment: Overall WFL for tasks assessed   Lower Extremity Assessment Lower Extremity Assessment: RLE deficits/detail RLE: Unable to fully assess due to pain       Communication Communication Communication: No difficulties   Cognition Arousal/Alertness: Awake/alert Behavior During Therapy: WFL for tasks assessed/performed Overall Cognitive Status: Within  Functional Limits for tasks assessed                                     General Comments  Pt stated has history of falls due to rushing, inappropriate footwear and pets.    Exercises  Other Exercises Other Exercises: Pt educated in compression sock mgmt including donning and doffing, positioning and wear schedule..   Other Exercises: Pt educated on safety strategies to utilize when performing ADL tasks. Other Exercises: Pt instructed in LB dressing strategies using AE and DME.   Shoulder Instructions      Home Living Family/patient expects to be discharged to:: Private residence Living Arrangements: Spouse/significant other Available Help at Discharge: Family;Available PRN/intermittently Type of Home: House Home Access: Stairs to enter [employer] of Steps: 3 Entrance Stairs-Rails: None Home Layout: One level     Bathroom Shower/Tub: Occupational psychologist: Standard Bathroom Accessibility: Yes How Accessible: Accessible via walker Home Equipment: Cane - single point;Walker - 2 wheels;Hand held shower head;Shower seat;Grab bars - tub/shower          Prior Functioning/Environment Level of Independence: Needs assistance    ADL's / Homemaking Assistance Needed: As to prior PT note on 10/01/2018, husband was helping with cooking/cleaning/groceries due to pain and decreased endurance; Independent with dressing/bathing/grooming; difficulty with LB dressing            OT Problem List: Decreased strength;Pain;Decreased range of motion;Decreased activity tolerance;Decreased knowledge of use of DME or AE      OT Treatment/Interventions: Self-care/ADL training;Therapeutic activities;Therapeutic exercise;Patient/family education;DME and/or AE instruction    OT Goals(Current goals can be found in the care plan section) Acute Rehab OT Goals Patient Stated Goal: Reduce pain and get back to making flower arrangements OT Goal Formulation: With  patient Time For Goal Achievement: 10/17/18 Potential to Achieve Goals: Good ADL Goals Pt Will Perform Lower Body Dressing: with mod assist;with adaptive equipment(Pt will complete LB dressing while sitting with MOD A) Pt Will Transfer to Toilet: with mod assist;ambulating;regular height toilet(Pt will use LRAD to ambulate to regular height toilet with MOD A) Additional ADL Goal #1: Pt will demonstrate 1 to 2 fall safety strategies while completing ADL tasks for increased safety.  OT Frequency: Min 2X/week   Barriers to D/C:    Pt has 3 steps to climb without railing       Co-evaluation              AM-PAC PT "6 Clicks" Daily Activity     Outcome Measure Help from another person eating meals?: None Help from another person taking care of personal grooming?: None Help from another person toileting, which includes using toliet, bedpan, or urinal?: A Lot Help from another person bathing (including washing, rinsing, drying)?: A Lot Help from another person to put on and taking off regular upper body clothing?: None Help from another person to put on and taking off  regular lower body clothing?: A Lot 6 Click Score: 18   End of Session    Activity Tolerance: Patient limited by pain Patient left: in bed;with call bell/phone within reach;with bed alarm set;with SCD's reapplied  OT Visit Diagnosis: History of falling (Z91.81);Other abnormalities of gait and mobility (R26.89);Pain Pain - Right/Left: Right Pain - part of body: Hip                Time: 8867-7373 OT Time Calculation (min): 34 min Charges:     Jadene Pierini OTS  10/03/2018, 12:37 PM

## 2018-10-03 NOTE — Progress Notes (Addendum)
[place] [facility]                                                                                                                                                                                  Patient Demographics   Denise King, is a 67 y.o. female, DOB - 1951/06/21, XTG:[id number]  Admit date - 10/01/2018   Admitting Physician Harrie Foreman, MD  Outpatient Primary MD for the patient is Biagio Borg, MD   LOS - 2  Subjective: Patient complains of pain in the hip and back   Review of Systems:   CONSTITUTIONAL: No documented fever. No fatigue, weakness. No weight gain, no weight loss.  EYES: No blurry or double vision.  ENT: No tinnitus. No postnasal drip. No redness of the oropharynx.  RESPIRATORY: No cough, no wheeze, no hemoptysis. No dyspnea.  CARDIOVASCULAR: No chest pain. No orthopnea. No palpitations. No syncope.  GASTROINTESTINAL: No nausea, no vomiting or diarrhea. No abdominal pain. No melena or hematochezia.  GENITOURINARY: No dysuria or hematuria.  ENDOCRINE: No polyuria or nocturia. No heat or cold intolerance.  HEMATOLOGY: No anemia. No bruising. No bleeding.  INTEGUMENTARY: No rashes. No lesions.  MUSCULOSKELETAL: No arthritis. No swelling. No gout.  Positive hip pain and back pain NEUROLOGIC: No numbness, tingling, or ataxia. No seizure-type activity.  PSYCHIATRIC: No anxiety. No insomnia. No ADD.    Vitals:   Vitals:   10/02/18 1747 10/02/18 2349 10/03/18 0819 10/03/18 1140  BP:  133/77 128/71 129/78  Pulse:  (!) 102 97 91  Resp:  16 18 16   Temp:  98.4 F (36.9 C) 98.2 F (36.8 C) 98 F (36.7 C)  TempSrc:  Oral Oral Oral  SpO2: 94% 93% 93% 96%  Weight:      Height:        Wt Readings from Last 3 Encounters:  10/01/18 57 kg  08/31/18 55 kg  06/24/18 59 kg     Intake/Output Summary (Last 24 hours) at 10/03/2018 1240 Last data filed at 10/03/2018 0115 Gross per 24 hour  Intake 120 ml  Output 850 ml   Net -730 ml    Physical Exam:   GENERAL: Pleasant-appearing in no apparent distress.  HEAD, EYES, EARS, NOSE AND THROAT: Atraumatic, normocephalic. Extraocular muscles are intact. Pupils equal and reactive to light. Sclerae anicteric. No conjunctival injection. No oro-pharyngeal erythema.  NECK: Supple. There is no jugular venous distention. No bruits, no lymphadenopathy, no thyromegaly.  HEART: Regular rate and rhythm,. No murmurs, no rubs, no clicks.  LUNGS: Clear to auscultation bilaterally. No rales or rhonchi. No wheezes.  ABDOMEN: Soft, flat, nontender, nondistended. Has good bowel sounds. No  hepatosplenomegaly appreciated.  EXTREMITIES: No evidence of any cyanosis, clubbing, or peripheral edema.  +2 pedal and radial pulses bilaterally.  NEUROLOGIC: The patient is alert, awake, and oriented x3 with no focal motor or sensory deficits appreciated bilaterally.  SKIN: Moist and warm with no rashes appreciated.  Psych: Not anxious, depressed LN: No inguinal LN enlargement    Antibiotics   Anti-infectives (From admission, onward)   Start     Dose/Rate Route Frequency Ordered Stop   10/02/18 0130  vancomycin (VANCOCIN) IVPB 1000 mg/200 mL premix     1,000 mg 200 mL/hr over 60 Minutes Intravenous Every 12 hours 10/01/18 1557 10/02/18 0217   10/01/18 1030  vancomycin (VANCOCIN) IVPB 1000 mg/200 mL premix     1,000 mg 200 mL/hr over 60 Minutes Intravenous  Once 10/01/18 1028 10/01/18 1437      Medications   Scheduled Meds: . docusate sodium  100 mg Oral BID  . DULoxetine  60 mg Oral Daily  . enoxaparin (LOVENOX) injection  40 mg Subcutaneous Q24H  . ferrous sulfate  325 mg Oral TID PC  . folic acid  1 mg Oral Daily  . gabapentin  300 mg Oral TID  . lidocaine  1 patch Transdermal Q24H  . LORazepam  0-4 mg Intravenous Q12H  . morphine  30 mg Oral Q8H  . multivitamin with minerals  1 tablet Oral Daily  . nicotine  21 mg Transdermal Daily  . pantoprazole  40 mg Oral BID  .  thiamine  100 mg Oral Daily   Or  . thiamine  100 mg Intravenous Daily  . traMADol  50 mg Oral Q6H   Continuous Infusions: . 0.9 % NaCl with KCl 20 mEq / L 100 mL/hr at 10/02/18 1754  . methocarbamol (ROBAXIN) IV     PRN Meds:.acetaminophen, alum & mag hydroxide-simeth, bisacodyl, clonazePAM, diphenoxylate-atropine, HYDROmorphone (DILAUDID) injection, hyoscyamine, LORazepam **OR** LORazepam, magnesium citrate, menthol-cetylpyridinium **OR** phenol, methocarbamol **OR** methocarbamol (ROBAXIN) IV, metoCLOPramide **OR** metoCLOPramide (REGLAN) injection, morphine, ondansetron **OR** ondansetron (ZOFRAN) IV, polyethylene glycol   Data Review:   Micro Results Recent Results (from the past 240 hour(s))  Surgical pcr screen     Status: None   Collection Time: 10/01/18  7:07 AM  Result Value Ref Range Status   MRSA, PCR NEGATIVE NEGATIVE Final   Staphylococcus aureus NEGATIVE NEGATIVE Final    Comment: (NOTE) The Xpert SA Assay (FDA approved for NASAL specimens in patients 60 years of age and older), is one component of a comprehensive surveillance program. It is not intended to diagnose infection nor to guide or monitor treatment. Performed at [facility], [address], [place], [place] [address]     Radiology Reports Dg Chest 1 View  Result Date: 10/01/2018 CLINICAL DATA:  67 y/o F; right hip pain after fall. History of hypertension and pneumonia. EXAM: CHEST  1 VIEW COMPARISON:  05/15/2018 chest radiograph. FINDINGS: Normal cardiac silhouette. Aortic atherosclerosis with calcification. Pulmonary venous hypertension. No focal consolidation. No pleural effusion or pneumothorax. No acute osseous abnormality is evident. IMPRESSION: Pulmonary venous hypertension. No focal consolidation. Aortic Atherosclerosis (ICD10-I70.0). Electronically Signed   By: Kristine Garbe M.D.   On: 10/01/2018 03:01   Dg Hip Port Unilat With Pelvis 1v Right  Result Date:  10/01/2018 CLINICAL DATA:  Right hip ORIF EXAM: DG HIP (WITH OR WITHOUT PELVIS) 1V PORT RIGHT COMPARISON:  10/01/2018 FINDINGS: Internal fixation across the right femoral intertrochanteric fracture. Anatomic alignment. No hardware complicating feature. IMPRESSION: Internal fixation across the  right femoral intertrochanteric fracture with anatomic alignment and no visible complicating feature. Electronically Signed   By: Rolm Baptise M.D.   On: 10/01/2018 15:44   Dg Hip Operative Unilat W Or W/o Pelvis Right  Result Date: 10/01/2018 CLINICAL DATA:  IM nailing RIGHT hip EXAM: OPERATIVE RIGHT HIP (WITH PELVIS IF PERFORMED) 4 VIEWS TECHNIQUE: Fluoroscopic spot image(s) were submitted for interpretation post-operatively. COMPARISON:  10/01/2018 RIGHT hip radiographs 1 minutes 8 seconds Images obtained: 4 Dose: 9.09 mGy FINDINGS: IM nail with compression screw placed across reduced intertrochanteric fracture of RIGHT femur. Bones demineralized. No new fracture, dislocation or bone destruction. Scattered atherosclerotic calcifications. IMPRESSION: Post ORIF of a reduced intertrochanteric fracture of the RIGHT femur. Electronically Signed   By: Lavonia Dana M.D.   On: 10/01/2018 15:08   Dg Hip Unilat  With Pelvis 2-3 Views Right  Result Date: 10/01/2018 CLINICAL DATA:  67 y/o  F; right hip pain after fall. EXAM: DG HIP (WITH OR WITHOUT PELVIS) 2-3V RIGHT COMPARISON:  None. FINDINGS: Acute intertrochanteric mildly displaced and angulated fracture of the right proximal femur. No hip joint dislocation. No pelvic fracture or diastasis identified. Left proximal femur and hip joint is well maintained. Partially visualized lower lumbar spine fusion hardware. Vascular calcifications noted. IMPRESSION: Acute intertrochanteric mildly displaced and angulated fracture of the right proximal femur. Electronically Signed   By: Kristine Garbe M.D.   On: 10/01/2018 02:58     CBC Recent Labs  Lab 10/01/18 0253  10/02/18 0346 10/03/18 0314  WBC 7.2 9.5 9.1  HGB 20.7* 16.4* 15.7  HCT 61.3* 49.0* 46.6  PLT 212 156 165  MCV 102.5* 102.6* 104.3*  MCH 34.7* 34.5* 35.1*  MCHC 33.8 33.6 33.7  RDW 16.8* 16.2* 16.5*  LYMPHSABS 3.6  --   --   MONOABS 0.7  --   --   EOSABS 0.1  --   --   BASOSABS 0.1  --   --     Chemistries  Recent Labs  Lab 10/01/18 0253 10/01/18 1047 10/02/18 0346  NA 131*  --  134*  K 2.8* 3.5 3.5  CL 84*  --  98  CO2 30  --  29  GLUCOSE 89  --  128*  BUN 8  --  10  CREATININE 0.58  --  0.70  CALCIUM 9.0  --  7.1*  MG 2.1  --   --    ------------------------------------------------------------------------------------------------------------------ estimated creatinine clearance is 58 mL/min (by C-G formula based on SCr of 0.7 mg/dL). ------------------------------------------------------------------------------------------------------------------ Recent Labs    10/01/18 0253  HGBA1C 5.2   ------------------------------------------------------------------------------------------------------------------ No results for input(s): CHOL, HDL, LDLCALC, TRIG, CHOLHDL, LDLDIRECT in the last 72 hours. ------------------------------------------------------------------------------------------------------------------ Recent Labs    10/01/18 0253  TSH 1.108   ------------------------------------------------------------------------------------------------------------------ No results for input(s): VITAMINB12, FOLATE, FERRITIN, TIBC, IRON, RETICCTPCT in the last 72 hours.  Coagulation profile Recent Labs  Lab 10/01/18 0253  INR 0.88    No results for input(s): DDIMER in the last 72 hours.  Cardiac Enzymes No results for input(s): CKMB, TROPONINI, MYOGLOBIN in the last 168 hours.  Invalid input(s): CK ------------------------------------------------------------------------------------------------------------------ Invalid input(s): [place]    This is a 67 year old female admitted for hip fracture. 1.  Hip fracture: Right intertrochanteric regulated fracture of the proximal femur.  S/p repair 2.  Hypokalemia: Replaced  3.  Polycythemia:improved with iv hydration  will need outpatient hematology follow-up 4.  Tobacco abuse: Counseled on smoking cessation.  NicoDerm patch while hospitalized.  5.  Possible alcohol abuse: No evidence of withdrawal  6.  DVT prophylaxis: SCDs 7.  GI prophylaxis: None       Code Status Orders  (From admission, onward)         Start     Ordered   10/01/18 0459  Full code  Continuous     10/01/18 0458        Code Status History    Date Active Date Inactive Code Status Order ID Comments User Context   05/13/2018 0653 05/16/2018 1626 Full Code [id number]  Radene Gunning, NP ED           Consults orthopedic  DVT Prophylaxis SCDs  Lab Results  Component Value Date   PLT 165 10/03/2018     Time Spent in minutes 85min  Greater than 50% of time spent in care coordination and counseling patient regarding the condition and plan of care.   Dustin Flock M.D on 10/03/2018 at 12:40 PM  Between 7am to 6pm - Pager - [phone]  After 6pm go to [email or web address] - Proofreader  [employer]   Office  [phone]

## 2018-10-03 NOTE — Progress Notes (Signed)
OT Cancellation Note  Patient Details Name: Denise King MRN: [id number] DOB: 1951-01-21   Cancelled Treatment:    Reason Eval/Treat Not Completed: Pain limiting ability to participate. Order received, chart reviewed. Pt with 8/10 pain, waiting for pain medicine. RN aware. Will re-attempt at later date/time as pt is able to participate.   Jeni Salles, MPH, MS, OTR/L ascom [phone] 10/03/18, 9:10 AM

## 2018-10-03 NOTE — Progress Notes (Signed)
  Subjective:  POD #2 s/p IM fixation of right IT hip fracture.  Patient reports right hip pain as mild.  Patient has chronic low back pain and this is more bothersome to her now.  Patient has not yet been able to get out of bed with PT.  Objective:   VITALS:   Vitals:   10/02/18 1747 10/02/18 2349 10/03/18 0819 10/03/18 1140  BP:  133/77 128/71 129/78  Pulse:  (!) 102 97 91  Resp:  16 18 16   Temp:  98.4 F (36.9 C) 98.2 F (36.8 C) 98 F (36.7 C)  TempSrc:  Oral Oral Oral  SpO2: 94% 93% 93% 96%  Weight:      Height:        PHYSICAL EXAM: Right lower extremity:   Neurovascular intact Sensation intact distally Intact pulses distally Dorsiflexion/Plantar flexion intact Incision: scant drainage on dressing.  Unchanged from yesterday. No cellulitis present Compartment soft  LABS  Results for orders placed or performed during the hospital encounter of 10/01/18 (from the past 24 hour(s))  CBC     Status: Abnormal   Collection Time: 10/03/18  3:14 AM  Result Value Ref Range   WBC 9.1 3.6 - 11.0 K/uL   RBC 4.47 3.80 - 5.20 MIL/uL   Hemoglobin 15.7 12.0 - 16.0 g/dL   HCT 46.6 35.0 - 47.0 %   MCV 104.3 (H) 80.0 - 100.0 fL   MCH 35.1 (H) 26.0 - 34.0 pg   MCHC 33.7 32.0 - 36.0 g/dL   RDW 16.5 (H) 11.5 - 14.5 %   Platelets 165 150 - 440 K/uL    Dg Hip Port Unilat With Pelvis 1v Right  Result Date: 10/01/2018 CLINICAL DATA:  Right hip ORIF EXAM: DG HIP (WITH OR WITHOUT PELVIS) 1V PORT RIGHT COMPARISON:  10/01/2018 FINDINGS: Internal fixation across the right femoral intertrochanteric fracture. Anatomic alignment. No hardware complicating feature. IMPRESSION: Internal fixation across the right femoral intertrochanteric fracture with anatomic alignment and no visible complicating feature. Electronically Signed   By: Rolm Baptise M.D.   On: 10/01/2018 15:44   Dg Hip Operative Unilat W Or W/o Pelvis Right  Result Date: 10/01/2018 CLINICAL DATA:  IM nailing RIGHT hip EXAM:  OPERATIVE RIGHT HIP (WITH PELVIS IF PERFORMED) 4 VIEWS TECHNIQUE: Fluoroscopic spot image(s) were submitted for interpretation post-operatively. COMPARISON:  10/01/2018 RIGHT hip radiographs 1 minutes 8 seconds Images obtained: 4 Dose: 9.09 mGy FINDINGS: IM nail with compression screw placed across reduced intertrochanteric fracture of RIGHT femur. Bones demineralized. No new fracture, dislocation or bone destruction. Scattered atherosclerotic calcifications. IMPRESSION: Post ORIF of a reduced intertrochanteric fracture of the RIGHT femur. Electronically Signed   By: Lavonia Dana M.D.   On: 10/01/2018 15:08    Assessment/Plan: 2 Days Post-Op   Active Problems:   Hip fracture ([place])  Patient is improving postop.  Continue physical therapy.  Awaiting approval for skilled nursing facility.    [place] , MD 10/03/2018, 1:05 PM

## 2018-10-03 NOTE — Progress Notes (Signed)
Pt remaining alert. Pt able to sleep in between care. Medicated for pain through the night. Pt with Chronic Back pain. Pt was able to void on own during the night.

## 2018-10-03 NOTE — Clinical Social Work Placement (Signed)
   CLINICAL SOCIAL WORK PLACEMENT  NOTE  Date:  10/03/2018  Patient Details  Name: Denise King MRN: [id number] Date of Birth: 1951/10/21  Clinical Social Work is seeking post-discharge placement for this patient at the [facility] level of care (*CSW will initial, date and re-position this form in  chart as items are completed):  Yes   Patient/family provided with [facility] Department's list of facilities offering this level of care within the geographic area requested by the patient (or if unable, by the patient's family).  Yes   Patient/family informed of their freedom to choose among providers that offer the needed level of care, that participate in Medicare, Medicaid or managed care program needed by the patient, have an available bed and are willing to accept the patient.  Yes   Patient/family informed of [facility]'s ownership interest in [facility] and [facility], as well as of the fact that they are under no obligation to receive care at these facilities.  PASRR submitted to EDS on 10/02/18     PASRR number received on 10/02/18     Existing PASRR number confirmed on       FL2 transmitted to all facilities in geographic area requested by pt/family on 10/02/18     FL2 transmitted to all facilities within larger geographic area on       Patient informed that his/her managed care company has contracts with or will negotiate with certain facilities, including the following:        Yes   Patient/family informed of bed offers received.  Patient chooses bed at ([facility] )     Physician recommends and patient chooses bed at      Patient to be transferred to   on  .  Patient to be transferred to facility by       Patient family notified on   of transfer.  Name of family member notified:        PHYSICIAN Please sign FL2     Additional Comment:    _______________________________________________ Phillipe Clemon, Veronia Beets,  LCSW 10/03/2018, 9:22 AM

## 2018-10-03 NOTE — Progress Notes (Signed)
Physical Therapy Treatment Patient Details Name: RHENDA OREGON MRN: [id number] DOB: 1951-08-16 Today's Date: 10/03/2018    History of Present Illness Dynver 'Delrae Hagey is a 67yo female who comes to [facility] on 10/5 after sustaining a fall at home, and right hip pain. Pt underwent Rt hip ORIF c IM nail on 10/5. PMH: tobacco use, chronic pain c opite dependence, depression, vertigo, GERD, and lumbar fusion L4-S1. PTA pt AMB mostly limited household distances without AD, reports 2 falls in past 3 months.     PT Comments    Pt agreeable to PT for supine bed exercises only. Pt had just participated in OT with attempts to edge of bed; pt fatigued and rates LB and R hip/LE pain 7/10. Participates in BLEs with assistance as needed on RLE. Continue PT to progress participation, strength and endurance as pt able to improve all functional mobility.    Follow Up Recommendations  SNF     Equipment Recommendations       Recommendations for Other Services       Precautions / Restrictions Precautions Precautions: Fall Restrictions Weight Bearing Restrictions: Yes RLE Weight Bearing: Weight bearing as tolerated    Mobility  Bed Mobility               General bed mobility comments: Not tested; pt had just attempted with OT and wishes to remain in bed.   Transfers Overall transfer level: Needs assistance Equipment used: Rolling walker (2 wheeled)                Ambulation/Gait                 Stairs             Wheelchair Mobility    Modified Rankin (Stroke Patients Only)       Balance Overall balance assessment: History of Falls                                          Cognition Arousal/Alertness: Awake/alert Behavior During Therapy: WFL for tasks assessed/performed Overall Cognitive Status: Within Functional Limits for tasks assessed                                        Exercises General Exercises -  Lower Extremity Ankle Circles/Pumps: AROM;20 reps;Supine Quad Sets: Strengthening;Both;10 reps;Supine Gluteal Sets: Strengthening;Both;10 reps;Supine Short Arc Quad: AROM;Both;10 reps;Supine Heel Slides: AAROM;Right;10 reps;Supine(AROM L) Hip ABduction/ADduction: AAROM;Right;10 reps;Supine(AROM L) Other Exercises Other Exercises: Pt received and was receptive to information regarding hip precautions and compression sock mgmt including donning and doffing, positioning and wear schedule..   Other Exercises: Pt educated on safety strategies to utilize when performing ADL tasks. Other Exercises: OT provided demonstration in LB dressing strategies using AE and DME.    General Comments General comments (skin integrity, edema, etc.): Pt stated has history of falls due to rushing, inappropriate footwear and pets.      Pertinent Vitals/Pain Pain Assessment: 0-10 Pain Score: 7  Pain Location: LB and R hip; goes to below knee with movement Pain Descriptors / Indicators: Grimacing;Sharp Pain Intervention(s): Limited activity within patient's tolerance;Monitored during session;Premedicated before session;Repositioned    Home Living Family/patient expects to be discharged to:: Private residence Living Arrangements: Spouse/significant other Available Help at Discharge: Family;Available PRN/intermittently Type of Home:  House Home Access: Stairs to enter Entrance Stairs-Rails: None Home Layout: One level Home Equipment: Cane - single point;Walker - 2 wheels;Hand held shower head;Shower seat;Grab bars - tub/shower      Prior Function Level of Independence: Needs assistance    ADL's / Homemaking Assistance Needed: As to prior PT note on 10/01/2018, husband was helping with cooking/cleaning/groceries due to pain and decreased endurance; Independent with dressing/bathing/grooming; difficulty with LB dressing     PT Goals (current goals can now be found in the care plan section) Acute Rehab PT  Goals Patient Stated Goal: Reduce pain and get back to making flower arrangements Progress towards PT goals: Not progressing toward goals - comment    Frequency    BID      PT Plan Current plan remains appropriate    Co-evaluation              AM-PAC PT "6 Clicks" Daily Activity  Outcome Measure  Difficulty turning over in bed (including adjusting bedclothes, sheets and blankets)?: Unable Difficulty moving from lying on back to sitting on the side of the bed? : Unable Difficulty sitting down on and standing up from a chair with arms (e.g., wheelchair, bedside commode, etc,.)?: Unable Help needed moving to and from a bed to chair (including a wheelchair)?: Total Help needed walking in hospital room?: Total Help needed climbing 3-5 steps with a railing? : Total 6 Click Score: 6    End of Session   Activity Tolerance: Patient limited by pain Patient left: in bed;with call bell/phone within reach;with bed alarm set   PT Visit Diagnosis: Difficulty in walking, not elsewhere classified (R26.2)     Time: 0102-7253 PT Time Calculation (min) (ACUTE ONLY): 18 min  Charges:  $Therapeutic Exercise: 8-22 mins                      Larae Grooms, PTA 10/03/2018, 1:59 PM
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
## 2018-10-03 NOTE — Progress Notes (Signed)
Physical Therapy Treatment Patient Details Name: Denise King MRN: [id number] DOB: August 21, 1951 Today's Date: 10/03/2018    History of Present Illness Denise King is a 67yo female who comes to [facility] on 10/5 after sustaining a fall at home, and right hip pain. Pt underwent Rt hip ORIF c IM nail on 10/5. PMH: tobacco use, chronic pain c opite dependence, depression, vertigo, GERD, and lumbar fusion L4-S1. PTA pt AMB mostly limited household distances without AD, reports 2 falls in past 3 months.     PT Comments    Pt agreeable to PT for exercises again. Continue to have significant pain RLE and is fatigued. Pt did increased repetitions with all exercises; continues to require assist on R with limited range. Continue PT to progress strength and endurance as tolerated to improve functional mobility and work toward up in the chair.    Follow Up Recommendations  SNF     Equipment Recommendations       Recommendations for Other Services       Precautions / Restrictions Precautions Precautions: Fall Restrictions Weight Bearing Restrictions: Yes RLE Weight Bearing: Weight bearing as tolerated    Mobility  Bed Mobility               General bed mobility comments: Not tested  Transfers Overall transfer level: Needs assistance Equipment used: Rolling walker (2 wheeled)                Ambulation/Gait                 Stairs             Wheelchair Mobility    Modified Rankin (Stroke Patients Only)       Balance Overall balance assessment: History of Falls                                          Cognition Arousal/Alertness: Awake/alert Behavior During Therapy: WFL for tasks assessed/performed Overall Cognitive Status: Within Functional Limits for tasks assessed                                        Exercises General Exercises - Lower Extremity Ankle Circles/Pumps: AROM;20 reps;Supine Quad  Sets: Strengthening;Both;Supine;15 reps Gluteal Sets: Strengthening;Both;Supine;15 reps Short Arc Quad: AROM;Both;Supine;15 reps Heel Slides: AAROM;Right;Supine;15 reps(AROM L) Hip ABduction/ADduction: AAROM;Right;Supine;15 reps(AROM L) Straight Leg Raises: AAROM;PROM;Both;5 reps;Supine(2 sets) Other Exercises Other Exercises: Pt educated in compression sock mgmt including donning and doffing, positioning and wear schedule..   Other Exercises: Pt educated on safety strategies to utilize when performing ADL tasks. Other Exercises: Pt instructed in LB dressing strategies using AE and DME.    General Comments General comments (skin integrity, edema, etc.): Pt stated has history of falls due to rushing, inappropriate footwear and pets.      Pertinent Vitals/Pain Pain Assessment: 0-10 Pain Score: 7  Pain Location: LB and R hip; goes to below knee with movement Pain Descriptors / Indicators: Grimacing;Sharp Pain Intervention(s): Limited activity within patient's tolerance;Monitored during session;Premedicated before session;Repositioned    Home Living Family/patient expects to be discharged to:: Private residence Living Arrangements: Spouse/significant other Available Help at Discharge: Family;Available PRN/intermittently Type of Home: House Home Access: Stairs to enter Entrance Stairs-Rails: None Home Layout: One level Home Equipment: Cane - single point;Walker -  2 wheels;Hand held shower head;Shower seat;Grab bars - tub/shower      Prior Function Level of Independence: Needs assistance    ADL's / Homemaking Assistance Needed: As to prior PT note on 10/01/2018, husband was helping with cooking/cleaning/groceries due to pain and decreased endurance; Independent with dressing/bathing/grooming; difficulty with LB dressing     PT Goals (current goals can now be found in the care plan section) Acute Rehab PT Goals Patient Stated Goal: Reduce pain and get back to making flower  arrangements Progress towards PT goals: Not progressing toward goals - comment    Frequency    BID      PT Plan Current plan remains appropriate    Co-evaluation              AM-PAC PT "6 Clicks" Daily Activity  Outcome Measure  Difficulty turning over in bed (including adjusting bedclothes, sheets and blankets)?: Unable Difficulty moving from lying on back to sitting on the side of the bed? : Unable Difficulty sitting down on and standing up from a chair with arms (e.g., wheelchair, bedside commode, etc,.)?: Unable Help needed moving to and from a bed to chair (including a wheelchair)?: Total Help needed walking in hospital room?: Total Help needed climbing 3-5 steps with a railing? : Total 6 Click Score: 6    End of Session   Activity Tolerance: Patient limited by pain Patient left: in bed;with call bell/phone within reach;with bed alarm set   PT Visit Diagnosis: Difficulty in walking, not elsewhere classified (R26.2)     Time: 4008-6761 PT Time Calculation (min) (ACUTE ONLY): 28 min  Charges:  $Therapeutic Exercise: 23-37 mins                      Larae Grooms, PTA 10/03/2018, 3:51 PM

## 2018-10-03 NOTE — Plan of Care (Signed)

## 2018-10-04 ENCOUNTER — Encounter: Payer: Self-pay | Admitting: Orthopedic Surgery

## 2018-10-04 LAB — CBC
HCT: 48.9 % — ABNORMAL HIGH (ref 35.0–47.0)
Hemoglobin: 16.5 g/dL — ABNORMAL HIGH (ref 12.0–16.0)
MCH: 35.7 pg — ABNORMAL HIGH (ref 26.0–34.0)
MCHC: 33.8 g/dL (ref 32.0–36.0)
MCV: 105.8 fL — ABNORMAL HIGH (ref 80.0–100.0)
Platelets: 184 10*3/uL (ref 150–440)
RBC: 4.62 MIL/uL (ref 3.80–5.20)
RDW: 16.2 % — ABNORMAL HIGH (ref 11.5–14.5)
WBC: 8 10*3/uL (ref 3.6–11.0)

## 2018-10-04 LAB — POTASSIUM: Potassium: 3.7 mmol/L (ref 3.5–5.1)

## 2018-10-04 MED ORDER — DULOXETINE HCL 60 MG PO CPEP
90.0000 mg | ORAL_CAPSULE | Freq: Every day | ORAL | Status: DC
  Administered 2018-10-05: 90 mg via ORAL
  Filled 2018-10-04: qty 1

## 2018-10-04 MED ORDER — INFLUENZA VAC SPLIT HIGH-DOSE 0.5 ML IM SUSY
0.5000 mL | PREFILLED_SYRINGE | INTRAMUSCULAR | Status: AC
  Administered 2018-10-05: 0.5 mL via INTRAMUSCULAR
  Filled 2018-10-04: qty 0.5

## 2018-10-04 MED ORDER — ENSURE ENLIVE PO LIQD: 237.0000 mL | Freq: Two times a day (BID) | ORAL | Status: DC

## 2018-10-04 NOTE — Progress Notes (Signed)
Physical Therapy Treatment Patient Details Name: Denise King MRN: [id number] DOB: [birth year] Today's Date: 10/04/2018    History of Present Illness 67yo female who comes to [facility] on 10/5 after sustaining a fall at home, and right hip pain. Pt underwent R hip ORIF IM nail on 10/5. PMH: tobacco use, chronic pain w/ opite dependence, depression, vertigo, GERD, and lumbar fusion L4-S1. PTA pt AMB mostly limited household distances without AD, reports 2 falls in past 3 months.     PT Comments    Pt continues to c/o considerable pain, has a lot of guarding and hesitation, has inconsistent but frequent c/o spasming in the R hip but was motivated to keep pushing herself with PT.  She is pain and generally limited but did show good effort and was able to push herself to slowly walk the 15 ft to the door with a lot of cuing, some physical assist and very labored effort.  She is having some new onset proximal tib anterior pain but was able to DF feet and clear toe during swing phase when she deliberately worked on this.  Pt continues to have more pain and functional limitations than typical at this point post-op, but did show significantly improved mobility with PT sessions today.   Follow Up Recommendations  SNF     Equipment Recommendations       Recommendations for Other Services       Precautions / Restrictions Precautions Precautions: Fall Restrictions RLE Weight Bearing: Weight bearing as tolerated    Mobility  Bed Mobility Overal bed mobility: Needs Assistance Bed Mobility: Supine to Sit     Supine to sit: Min assist     General bed mobility comments: Assist to get R LE back up and over in to bed, but pt did well with shifting hips and generally in getting back to supine  Transfers Overall transfer level: Needs assistance Equipment used: Rolling walker (2 wheeled) Transfers: Sit to/from Stand Sit to Stand: Min assist         General transfer comment: Pt still  needing light assist to fully shift weight forward, but was eager to do all she could despite pain, weakness and general hesitancy  Ambulation/Gait Ambulation/Gait assistance: Min assist Gait Distance (Feet): 15 Feet Assistive device: Rolling walker (2 wheeled)       General Gait Details: Pt again with very guarded/slow ambulation but was able to do much more than any previous PT session.  She showed good effort despite c/o pain with WBing, difficulty with clearing/advancing R LE and heavy reliance on the walker.    Stairs             Wheelchair Mobility    Modified Rankin (Stroke Patients Only)       Balance Overall balance assessment: Needs assistance Sitting-balance support: Bilateral upper extremity supported Sitting balance-Leahy Scale: Fair     Standing balance support: Bilateral upper extremity supported Standing balance-Leahy Scale: Poor                              Cognition Arousal/Alertness: Awake/alert Behavior During Therapy: WFL for tasks assessed/performed Overall Cognitive Status: Within Functional Limits for tasks assessed                                        Exercises General Exercises - Lower Extremity Ankle Circles/Pumps:  AROM;10 reps Quad Sets: Strengthening;10 reps Short Arc Quad: AROM;Strengthening;15 reps Heel Slides: AROM;10 reps Hip ABduction/ADduction: AROM;10 reps;Strengthening    General Comments        Pertinent Vitals/Pain Pain Assessment: 0-10 Pain Score: 6  Pain Location: lateral R hip pain and some newer onset tub anterior soreness    Home Living                      Prior Function            PT Goals (current goals can now be found in the care plan section) Progress towards PT goals: Progressing toward goals    Frequency    BID      PT Plan Current plan remains appropriate    Co-evaluation              AM-PAC PT "6 Clicks" Daily Activity  Outcome  Measure  Difficulty turning over in bed (including adjusting bedclothes, sheets and blankets)?: A Lot Difficulty moving from lying on back to sitting on the side of the bed? : Unable Difficulty sitting down on and standing up from a chair with arms (e.g., wheelchair, bedside commode, etc,.)?: Unable Help needed moving to and from a bed to chair (including a wheelchair)?: A Lot Help needed walking in hospital room?: Total Help needed climbing 3-5 steps with a railing? : Total 6 Click Score: 8    End of Session Equipment Utilized During Treatment: Gait belt Activity Tolerance: Patient limited by pain Patient left: with chair alarm set;with call bell/phone within reach;with family/visitor present Nurse Communication: Mobility status PT Visit Diagnosis: Difficulty in walking, not elsewhere classified (R26.2)     Time: 1410-1438 PT Time Calculation (min) (ACUTE ONLY): 28 min  Charges:  $Gait Training: 8-22 mins $Therapeutic Exercise: 8-22 mins                     Kreg Shropshire, DPT 10/04/2018, 3:44 PM

## 2018-10-04 NOTE — Progress Notes (Signed)
Physical Therapy Treatment Patient Details Name: Denise King MRN: [id number] DOB: February 23, 1951 Today's Date: 10/04/2018    History of Present Illness 67yo female who comes to [facility] on 10/5 after sustaining a fall at home, and right hip pain. Pt underwent R hip ORIF IM nail on 10/5. PMH: tobacco use, chronic pain w/ opite dependence, depression, vertigo, GERD, and lumbar fusion L4-S1. PTA pt AMB mostly limited household distances without AD, reports 2 falls in past 3 months.     PT Comments    Pt was able to rise to EOB and take a few small steps but is still very pain limited, guarded and needed a lot of encouragement to motivate getting up to sitting and to try a few steps.  She was able to rise to standing w/o heavy assist but did need a lot of cuing, encouragement and direct assist to initiate standing and then to get "fully" upright.  She c/o pain t/o the effort and though she was able to do some AROM with the R hip she needed AAROM/cuing for exercises and with getting toward EOB very slowly.     Follow Up Recommendations  SNF     Equipment Recommendations       Recommendations for Other Services       Precautions / Restrictions Precautions Precautions: Fall Restrictions RLE Weight Bearing: Weight bearing as tolerated    Mobility  Bed Mobility Overal bed mobility: Needs Assistance Bed Mobility: Supine to Sit     Supine to sit: Min assist     General bed mobility comments: Pt showed great effort with getting to EOB with very slow, guarded, cautious transition.  Pt did need light assist to control R LE off EOB and get trunk to upright  Transfers Overall transfer level: Needs assistance Equipment used: Rolling walker (2 wheeled) Transfers: Sit to/from Stand Sit to Stand: Min assist         General transfer comment: Pt initially very hesitant, but with heavy use of the walker was able to get to and maintain standing  Ambulation/Gait Ambulation/Gait assistance:  Mod assist Gait Distance (Feet): 6 Feet Assistive device: Rolling walker (2 wheeled)       General Gait Details: Pt extremely slow and hesitant with mobility and needed a lot of extra cuing, encourgement and assist to insure safety and heavy guarding with each attempt at R WBing.   Stairs             Wheelchair Mobility    Modified Rankin (Stroke Patients Only)       Balance Overall balance assessment: Needs assistance Sitting-balance support: Bilateral upper extremity supported Sitting balance-Leahy Scale: Fair     Standing balance support: Bilateral upper extremity supported Standing balance-Leahy Scale: Poor                              Cognition Arousal/Alertness: Awake/alert Behavior During Therapy: WFL for tasks assessed/performed Overall Cognitive Status: Within Functional Limits for tasks assessed                                        Exercises General Exercises - Lower Extremity Ankle Circles/Pumps: AROM;10 reps Quad Sets: Strengthening;10 reps Gluteal Sets: Strengthening;10 reps Short Arc Quad: AROM;Strengthening;15 reps Heel Slides: AROM;10 reps Hip ABduction/ADduction: AROM;10 reps;Strengthening    General Comments  Pertinent Vitals/Pain Pain Assessment: 0-10 Pain Score: 7  Pain Location: reports LBP is as bad as hip, very hesitant and guarded with all mobility     Home Living                      Prior Function            PT Goals (current goals can now be found in the care plan section) Progress towards PT goals: Progressing toward goals    Frequency    BID      PT Plan Current plan remains appropriate    Co-evaluation              AM-PAC PT "6 Clicks" Daily Activity  Outcome Measure  Difficulty turning over in bed (including adjusting bedclothes, sheets and blankets)?: A Lot Difficulty moving from lying on back to sitting on the side of the bed? : Unable Difficulty  sitting down on and standing up from a chair with arms (e.g., wheelchair, bedside commode, etc,.)?: Unable Help needed moving to and from a bed to chair (including a wheelchair)?: A Lot Help needed walking in hospital room?: Total Help needed climbing 3-5 steps with a railing? : Total 6 Click Score: 8    End of Session Equipment Utilized During Treatment: Gait belt Activity Tolerance: Patient limited by pain Patient left: with chair alarm set;with call bell/phone within reach;with family/visitor present Nurse Communication: Mobility status PT Visit Diagnosis: Difficulty in walking, not elsewhere classified (R26.2)     Time: 1045-1110 PT Time Calculation (min) (ACUTE ONLY): 25 min  Charges:  $Gait Training: 8-22 mins $Therapeutic Exercise: 8-22 mins                     Kreg Shropshire, DPT 10/04/2018, 2:08 PM

## 2018-10-04 NOTE — Progress Notes (Signed)
Initial Nutrition Assessment  DOCUMENTATION CODES:   Non-severe (moderate) malnutrition in context of social or environmental circumstances  INTERVENTION:  Provide Ensure Enlive po BID, each supplement provides 350 kcal and 20 grams of protein.  Continue MVI daily, thiamine 161 mg daily, folic acid 1 mg daily.  Discussed increased needs for calories and protein for adequate healing. Encouraged intake of small, frequent meals throughout the day in setting of early satiety.  NUTRITION DIAGNOSIS:   Moderate Malnutrition related to social / environmental circumstances(EtOH dependence, inadequate oral intake) as evidenced by moderate fat depletion, moderate muscle depletion.  GOAL:   Patient will meet greater than or equal to 90% of their needs  MONITOR:   PO intake, Supplement acceptance, Labs, Weight trends, I & O's  REASON FOR ASSESSMENT:   Consult(verbal consult) Assessment of nutrition requirement/status  ASSESSMENT:   67 year old female with PMHx of chronic bronchitis, reflux esophagitis, diverticulosis, [name], OP, depression, vitamin B12 deficiency, arthritis, HTN, GERD, EtOH dependence admitted with right intertrochanteric hip fracture s/p IM nailing on 10/5.   Met with patient at bedside. She reports she has had a poor appetite for years now. She endorses anorexia (absence of hunger) and early satiety. She only eats one meal per day and that is usually part of a hamburger or chicken sandwich at lunch. She may have a small snack of crackers or carrots  With dinner. She does not take any vitamins/minerals at home because they give her nausea and she has trouble swallowing them.   Patient reports her UBW was 130 lbs a few years ago and she has lost down to about 119 lbs now. Unsure if current weight is accurate.  Medications reviewed and include: Colace, ferrous sulfate 096 mg TID, folic acid 1 mg daily, gabapentin, MVI, pantoprazole, thiamine 100 mg daily, tramadol,  methocarbamol.  Labs reviewed: Sodium 134.  Discussed with RN.  NUTRITION - FOCUSED PHYSICAL EXAM:    Most Recent Value  Orbital Region  Moderate depletion  Upper Arm Region  Severe depletion  Thoracic and Lumbar Region  Moderate depletion  Buccal Region  Moderate depletion  Temple Region  Severe depletion  Clavicle Bone Region  Moderate depletion  Clavicle and Acromion Bone Region  Moderate depletion  Scapular Bone Region  Moderate depletion  Dorsal Hand  Moderate depletion  Patellar Region  Mild depletion  Anterior Thigh Region  Mild depletion  Posterior Calf Region  Moderate depletion  Edema (RD Assessment)  None  Hair  Reviewed  Eyes  Reviewed  Mouth  Reviewed  Skin  Reviewed  Nails  Reviewed     Diet Order:   Diet Order            Diet regular Room service appropriate? Yes; Fluid consistency: Thin  Diet effective now              EDUCATION NEEDS:   Education needs have been addressed  Skin:  Skin Assessment: Reviewed RN Assessment  Last BM:  10/03/2018 - medium type 4  Height:   Ht Readings from Last 1 Encounters:  10/01/18 '5\' 3"'$  (1.6 m)    Weight:   Wt Readings from Last 1 Encounters:  10/01/18 57 kg    Ideal Body Weight:  52.3 kg  BMI:  Body mass index is 22.26 kg/m.  Estimated Nutritional Needs:   Kcal:  1425-1710 (25-30 kcal/kg)  Protein:  75-85 grams (1.3-1.5 grams/kg)  Fluid:  1.4-1.7 L/day (1 mL/kcal)  Willey Blade, MS, RD, LDN Office: [phone] Pager:  [phone] After Hours/Weekend Pager: [phone]

## 2018-10-04 NOTE — Progress Notes (Signed)
  Subjective:  Postop day #3 status post IM fixation for right intertrochanteric hip fracture.  Patient reports right hip pain as mild.  Patient was able to get up out of bed with PT today.  Objective:   VITALS:   Vitals:   10/03/18 1748 10/03/18 2340 10/04/18 0756 10/04/18 1122  BP: 139/86 131/82 135/82 98/70  Pulse: (!) 101 98 91 87  Resp: 18 17 18 18   Temp: 98.7 F (37.1 C) 98.4 F (36.9 C) 98.2 F (36.8 C) 98.1 F (36.7 C)  TempSrc: Oral Oral Oral Oral  SpO2: 92% 90% 91% 92%  Weight:      Height:        PHYSICAL EXAM: Right lower extremity: Neurovascular intact Sensation intact distally Intact pulses distally Dorsiflexion/Plantar flexion intact Incision: no drainage No cellulitis present Compartment soft  LABS  Results for orders placed or performed during the hospital encounter of 10/01/18 (from the past 24 hour(s))  CBC     Status: Abnormal   Collection Time: 10/04/18  5:46 AM  Result Value Ref Range   WBC 8.0 3.6 - 11.0 K/uL   RBC 4.62 3.80 - 5.20 MIL/uL   Hemoglobin 16.5 (H) 12.0 - 16.0 g/dL   HCT 48.9 (H) 35.0 - 47.0 %   MCV 105.8 (H) 80.0 - 100.0 fL   MCH 35.7 (H) 26.0 - 34.0 pg   MCHC 33.8 32.0 - 36.0 g/dL   RDW 16.2 (H) 11.5 - 14.5 %   Platelets 184 150 - 440 K/uL  Potassium     Status: None   Collection Time: 10/04/18  5:46 AM  Result Value Ref Range   Potassium 3.7 3.5 - 5.1 mmol/L    No results found.  Assessment/Plan: 3 Days Post-Op   Active Problems:   Hip fracture [facility])  Patient is making progress with physical therapy.  Skilled nursing facility authorization is still pending.  Continue physical therapy.  Continue Lovenox for DVT prophylaxis.    Thornton Park , MD 10/04/2018, 5:55 PM

## 2018-10-04 NOTE — Progress Notes (Addendum)
[place] at [facility]                                                                                                                                                                                  Patient Demographics   Denise King, is a 67 y.o. female, DOB - [birth year], [place] date - 10/01/2018   Admitting Physician Harrie Foreman, MD  Outpatient Primary MD for the patient is Biagio Borg, MD   LOS - 3  Subjective: Pain under control   Review of Systems:   CONSTITUTIONAL: No documented fever. No fatigue, weakness. No weight gain, no weight loss.  EYES: No blurry or double vision.  ENT: No tinnitus. No postnasal drip. No redness of the oropharynx.  RESPIRATORY: No cough, no wheeze, no hemoptysis. No dyspnea.  CARDIOVASCULAR: No chest pain. No orthopnea. No palpitations. No syncope.  GASTROINTESTINAL: No nausea, no vomiting or diarrhea. No abdominal pain. No melena or hematochezia.  GENITOURINARY: No dysuria or hematuria.  ENDOCRINE: No polyuria or nocturia. No heat or cold intolerance.  HEMATOLOGY: No anemia. No bruising. No bleeding.  INTEGUMENTARY: No rashes. No lesions.  MUSCULOSKELETAL: No arthritis. No swelling. No gout.  Positive hip pain and back pain NEUROLOGIC: No numbness, tingling, or ataxia. No seizure-type activity.  PSYCHIATRIC: No anxiety. No insomnia. No ADD.    Vitals:   Vitals:   10/03/18 1748 10/03/18 2340 10/04/18 0756 10/04/18 1122  BP: 139/86 131/82 135/82 98/70  Pulse: (!) 101 98 91 87  Resp: 18 17 18 18   Temp: 98.7 F (37.1 C) 98.4 F (36.9 C) 98.2 F (36.8 C) 98.1 F (36.7 C)  TempSrc: Oral Oral Oral Oral  SpO2: 92% 90% 91% 92%  Weight:      Height:        Wt Readings from Last 3 Encounters:  10/01/18 57 kg  08/31/18 55 kg  06/24/18 59 kg     Intake/Output Summary (Last 24 hours) at 10/04/2018 1455 Last data filed at 10/04/2018 1300 Gross per 24 hour  Intake 240 ml  Output 1850  ml  Net -1610 ml    Physical Exam:   GENERAL: Pleasant-appearing in no apparent distress.  HEAD, EYES, EARS, NOSE AND THROAT: Atraumatic, normocephalic. Extraocular muscles are intact. Pupils equal and reactive to light. Sclerae anicteric. No conjunctival injection. No oro-pharyngeal erythema.  NECK: Supple. There is no jugular venous distention. No bruits, no lymphadenopathy, no thyromegaly.  HEART: Regular rate and rhythm,. No murmurs, no rubs, no clicks.  LUNGS: Clear to auscultation bilaterally. No rales or rhonchi. No wheezes.  ABDOMEN: Soft, flat, nontender, nondistended. Has good bowel sounds. No hepatosplenomegaly appreciated.  EXTREMITIES: No evidence of any cyanosis, clubbing, or peripheral edema.  +2 pedal and radial pulses bilaterally.  NEUROLOGIC: The patient is alert, awake, and oriented x3 with no focal motor or sensory deficits appreciated bilaterally.  SKIN: Moist and warm with no rashes appreciated.  Psych: Not anxious, depressed LN: No inguinal LN enlargement    Antibiotics   Anti-infectives (From admission, onward)   Start     Dose/Rate Route Frequency Ordered Stop   10/02/18 0130  vancomycin (VANCOCIN) IVPB 1000 mg/200 mL premix     1,000 mg 200 mL/hr over 60 Minutes Intravenous Every 12 hours 10/01/18 1557 10/02/18 0217   10/01/18 1030  vancomycin (VANCOCIN) IVPB 1000 mg/200 mL premix     1,000 mg 200 mL/hr over 60 Minutes Intravenous  Once 10/01/18 1028 10/01/18 1437      Medications   Scheduled Meds: . docusate sodium  100 mg Oral BID  . [START ON 10/05/2018] DULoxetine  90 mg Oral Daily  . enoxaparin (LOVENOX) injection  40 mg Subcutaneous Q24H  . ferrous sulfate  325 mg Oral TID PC  . folic acid  1 mg Oral Daily  . gabapentin  300 mg Oral TID  . [START ON 10/05/2018] Influenza vac split quadrivalent PF  0.5 mL Intramuscular Tomorrow-1000  . lidocaine  1 patch Transdermal Q24H  . LORazepam  0-4 mg Intravenous Q12H  . morphine  30 mg Oral Q8H  .  multivitamin with minerals  1 tablet Oral Daily  . nicotine  21 mg Transdermal Daily  . pantoprazole  40 mg Oral BID  . thiamine  100 mg Oral Daily   Or  . thiamine  100 mg Intravenous Daily  . traMADol  50 mg Oral Q6H   Continuous Infusions: . 0.9 % NaCl with KCl 20 mEq / L 100 mL/hr at 10/02/18 1754  . methocarbamol (ROBAXIN) IV     PRN Meds:.acetaminophen, alum & mag hydroxide-simeth, bisacodyl, clonazePAM, diphenoxylate-atropine, HYDROmorphone (DILAUDID) injection, hyoscyamine, magnesium citrate, menthol-cetylpyridinium **OR** phenol, methocarbamol **OR** methocarbamol (ROBAXIN) IV, metoCLOPramide **OR** metoCLOPramide (REGLAN) injection, morphine, ondansetron **OR** ondansetron (ZOFRAN) IV, polyethylene glycol   Data Review:   Micro Results Recent Results (from the past 240 hour(s))  Surgical pcr screen     Status: None   Collection Time: 10/01/18  7:07 AM  Result Value Ref Range Status   MRSA, PCR NEGATIVE NEGATIVE Final   Staphylococcus aureus NEGATIVE NEGATIVE Final    Comment: (NOTE) The Xpert SA Assay (FDA approved for NASAL specimens in patients 65 years of age and older), is one component of a comprehensive surveillance program. It is not intended to diagnose infection nor to guide or monitor treatment. Performed at [facility], [address] [place], [place] [address]     Radiology Reports Dg Chest 1 View  Result Date: 10/01/2018 CLINICAL DATA:  67 y/o F; right hip pain after fall. History of hypertension and pneumonia. EXAM: CHEST  1 VIEW COMPARISON:  05/15/2018 chest radiograph. FINDINGS: Normal cardiac silhouette. Aortic atherosclerosis with calcification. Pulmonary venous hypertension. No focal consolidation. No pleural effusion or pneumothorax. No acute osseous abnormality is evident. IMPRESSION: Pulmonary venous hypertension. No focal consolidation. Aortic Atherosclerosis (ICD10-I70.0). Electronically Signed   By: Kristine Garbe M.D.    On: 10/01/2018 03:01   Dg Hip Port Unilat With Pelvis 1v Right  Result Date: 10/01/2018 CLINICAL DATA:  Right hip ORIF EXAM: DG HIP (WITH OR WITHOUT PELVIS) 1V PORT RIGHT COMPARISON:  10/01/2018 FINDINGS: Internal fixation across the right femoral intertrochanteric  fracture. Anatomic alignment. No hardware complicating feature. IMPRESSION: Internal fixation across the right femoral intertrochanteric fracture with anatomic alignment and no visible complicating feature. Electronically Signed   By: Rolm Baptise M.D.   On: 10/01/2018 15:44   Dg Hip Operative Unilat W Or W/o Pelvis Right  Result Date: 10/01/2018 CLINICAL DATA:  IM nailing RIGHT hip EXAM: OPERATIVE RIGHT HIP (WITH PELVIS IF PERFORMED) 4 VIEWS TECHNIQUE: Fluoroscopic spot image(s) were submitted for interpretation post-operatively. COMPARISON:  10/01/2018 RIGHT hip radiographs 1 minutes 8 seconds Images obtained: 4 Dose: 9.09 mGy FINDINGS: IM nail with compression screw placed across reduced intertrochanteric fracture of RIGHT femur. Bones demineralized. No new fracture, dislocation or bone destruction. Scattered atherosclerotic calcifications. IMPRESSION: Post ORIF of a reduced intertrochanteric fracture of the RIGHT femur. Electronically Signed   By: Lavonia Dana M.D.   On: 10/01/2018 15:08   Dg Hip Unilat  With Pelvis 2-3 Views Right  Result Date: 10/01/2018 CLINICAL DATA:  67 y/o  F; right hip pain after fall. EXAM: DG HIP (WITH OR WITHOUT PELVIS) 2-3V RIGHT COMPARISON:  None. FINDINGS: Acute intertrochanteric mildly displaced and angulated fracture of the right proximal femur. No hip joint dislocation. No pelvic fracture or diastasis identified. Left proximal femur and hip joint is well maintained. Partially visualized lower lumbar spine fusion hardware. Vascular calcifications noted. IMPRESSION: Acute intertrochanteric mildly displaced and angulated fracture of the right proximal femur. Electronically Signed   By: Kristine Garbe  M.D.   On: 10/01/2018 02:58     CBC Recent Labs  Lab 10/01/18 0253 10/02/18 0346 10/03/18 0314 10/04/18 0546  WBC 7.2 9.5 9.1 8.0  HGB 20.7* 16.4* 15.7 16.5*  HCT 61.3* 49.0* 46.6 48.9*  PLT 212 156 165 184  MCV 102.5* 102.6* 104.3* 105.8*  MCH 34.7* 34.5* 35.1* 35.7*  MCHC 33.8 33.6 33.7 33.8  RDW 16.8* 16.2* 16.5* 16.2*  LYMPHSABS 3.6  --   --   --   MONOABS 0.7  --   --   --   EOSABS 0.1  --   --   --   BASOSABS 0.1  --   --   --     Chemistries  Recent Labs  Lab 10/01/18 0253 10/01/18 1047 10/02/18 0346 10/04/18 0546  NA 131*  --  134*  --   K 2.8* 3.5 3.5 3.7  CL 84*  --  98  --   CO2 30  --  29  --   GLUCOSE 89  --  128*  --   BUN 8  --  10  --   CREATININE 0.58  --  0.70  --   CALCIUM 9.0  --  7.1*  --   MG 2.1  --   --   --    ------------------------------------------------------------------------------------------------------------------ estimated creatinine clearance is 58 mL/min (by C-G formula based on SCr of 0.7 mg/dL). ------------------------------------------------------------------------------------------------------------------ No results for input(s): HGBA1C in the last 72 hours. ------------------------------------------------------------------------------------------------------------------ No results for input(s): CHOL, HDL, LDLCALC, TRIG, CHOLHDL, LDLDIRECT in the last 72 hours. ------------------------------------------------------------------------------------------------------------------ No results for input(s): TSH, T4TOTAL, T3FREE, THYROIDAB in the last 72 hours.  Invalid input(s): FREET3 ------------------------------------------------------------------------------------------------------------------ No results for input(s): VITAMINB12, FOLATE, FERRITIN, TIBC, IRON, RETICCTPCT in the last 72 hours.  Coagulation profile Recent Labs  Lab 10/01/18 0253  INR 0.88    No results for input(s): DDIMER in the last 72 hours.  Cardiac  Enzymes No results for input(s): CKMB, TROPONINI, MYOGLOBIN in the last 168 hours.  Invalid input(s): CK ------------------------------------------------------------------------------------------------------------------ Invalid  input(s): POCBNP    Assessment & Plan   This is a 66 year old female admitted for hip fracture. 1.  Hip fracture: Right intertrochanteric regulated fracture of the proximal femur.  S/p repair 2.  Hypokalemia: Replaced  3.  Polycythemia:improved with iv hydration  will need outpatient hematology follow-up 4.  Tobacco abuse: Counseled on smoking cessation.  NicoDerm patch while hospitalized.   5.  Possible alcohol abuse: No evidence of withdrawal  6.  DVT prophylaxis: SCDs 7.  GI prophylaxis: None  Discharge to snf when authorized      Code Status Orders  (From admission, onward)         Start     Ordered   10/01/18 0459  Full code  Continuous     10/01/18 0458        Code Status History    Date Active Date Inactive Code Status Order ID Comments User Context   05/13/2018 0653 05/16/2018 1626 Full Code [id number]  Radene Gunning, NP ED           Consults orthopedic  DVT Prophylaxis SCDs  Lab Results  Component Value Date   PLT 184 10/04/2018     Time Spent in minutes 55min  Greater than 50% of time spent in care coordination and counseling patient regarding the condition and plan of care.   Dustin Flock M.D on 10/04/2018 at 2:55 PM  Between 7am to 6pm - Pager - [phone]  After 6pm go to [email or web address] - Proofreader  [employer]   Office  [phone]

## 2018-10-05 DIAGNOSIS — S72001D Fracture of unspecified part of neck of right femur, subsequent encounter for closed fracture with routine healing: Secondary | ICD-10-CM | POA: Diagnosis not present

## 2018-10-05 DIAGNOSIS — E538 Deficiency of other specified B group vitamins: Secondary | ICD-10-CM | POA: Diagnosis not present

## 2018-10-05 DIAGNOSIS — K5909 Other constipation: Secondary | ICD-10-CM | POA: Diagnosis not present

## 2018-10-05 DIAGNOSIS — W010XXA Fall on same level from slipping, tripping and stumbling without subsequent striking against object, initial encounter: Secondary | ICD-10-CM | POA: Diagnosis not present

## 2018-10-05 DIAGNOSIS — Z720 Tobacco use: Secondary | ICD-10-CM | POA: Diagnosis not present

## 2018-10-05 DIAGNOSIS — Z8719 Personal history of other diseases of the digestive system: Secondary | ICD-10-CM | POA: Diagnosis not present

## 2018-10-05 DIAGNOSIS — Z8042 Family history of malignant neoplasm of prostate: Secondary | ICD-10-CM | POA: Diagnosis not present

## 2018-10-05 DIAGNOSIS — D649 Anemia, unspecified: Secondary | ICD-10-CM | POA: Diagnosis not present

## 2018-10-05 DIAGNOSIS — K5900 Constipation, unspecified: Secondary | ICD-10-CM | POA: Diagnosis not present

## 2018-10-05 DIAGNOSIS — R600 Localized edema: Secondary | ICD-10-CM | POA: Diagnosis not present

## 2018-10-05 DIAGNOSIS — M810 Age-related osteoporosis without current pathological fracture: Secondary | ICD-10-CM | POA: Diagnosis not present

## 2018-10-05 DIAGNOSIS — N390 Urinary tract infection, site not specified: Secondary | ICD-10-CM | POA: Diagnosis not present

## 2018-10-05 DIAGNOSIS — Y9289 Other specified places as the place of occurrence of the external cause: Secondary | ICD-10-CM | POA: Diagnosis not present

## 2018-10-05 DIAGNOSIS — Z7401 Bed confinement status: Secondary | ICD-10-CM | POA: Diagnosis not present

## 2018-10-05 DIAGNOSIS — Z79899 Other long term (current) drug therapy: Secondary | ICD-10-CM | POA: Diagnosis not present

## 2018-10-05 DIAGNOSIS — N39498 Other specified urinary incontinence: Secondary | ICD-10-CM | POA: Diagnosis not present

## 2018-10-05 DIAGNOSIS — M6281 Muscle weakness (generalized): Secondary | ICD-10-CM | POA: Diagnosis not present

## 2018-10-05 DIAGNOSIS — F1020 Alcohol dependence, uncomplicated: Secondary | ICD-10-CM | POA: Diagnosis not present

## 2018-10-05 DIAGNOSIS — Z743 Need for continuous supervision: Secondary | ICD-10-CM | POA: Diagnosis not present

## 2018-10-05 DIAGNOSIS — E876 Hypokalemia: Secondary | ICD-10-CM | POA: Diagnosis not present

## 2018-10-05 DIAGNOSIS — M7989 Other specified soft tissue disorders: Secondary | ICD-10-CM | POA: Diagnosis not present

## 2018-10-05 DIAGNOSIS — R69 Illness, unspecified: Secondary | ICD-10-CM | POA: Diagnosis not present

## 2018-10-05 DIAGNOSIS — G4700 Insomnia, unspecified: Secondary | ICD-10-CM | POA: Diagnosis not present

## 2018-10-05 DIAGNOSIS — S72001A Fracture of unspecified part of neck of right femur, initial encounter for closed fracture: Secondary | ICD-10-CM | POA: Diagnosis not present

## 2018-10-05 DIAGNOSIS — K219 Gastro-esophageal reflux disease without esophagitis: Secondary | ICD-10-CM | POA: Diagnosis not present

## 2018-10-05 DIAGNOSIS — D7589 Other specified diseases of blood and blood-forming organs: Secondary | ICD-10-CM | POA: Diagnosis not present

## 2018-10-05 DIAGNOSIS — M25551 Pain in right hip: Secondary | ICD-10-CM | POA: Diagnosis not present

## 2018-10-05 DIAGNOSIS — F329 Major depressive disorder, single episode, unspecified: Secondary | ICD-10-CM | POA: Diagnosis not present

## 2018-10-05 DIAGNOSIS — G894 Chronic pain syndrome: Secondary | ICD-10-CM | POA: Diagnosis not present

## 2018-10-05 DIAGNOSIS — F419 Anxiety disorder, unspecified: Secondary | ICD-10-CM | POA: Diagnosis not present

## 2018-10-05 DIAGNOSIS — Z23 Encounter for immunization: Secondary | ICD-10-CM | POA: Diagnosis not present

## 2018-10-05 DIAGNOSIS — D751 Secondary polycythemia: Secondary | ICD-10-CM | POA: Diagnosis not present

## 2018-10-05 DIAGNOSIS — I10 Essential (primary) hypertension: Secondary | ICD-10-CM | POA: Diagnosis not present

## 2018-10-05 DIAGNOSIS — Z800 Family history of malignant neoplasm of digestive organs: Secondary | ICD-10-CM | POA: Diagnosis not present

## 2018-10-05 DIAGNOSIS — R609 Edema, unspecified: Secondary | ICD-10-CM | POA: Diagnosis not present

## 2018-10-05 DIAGNOSIS — R2241 Localized swelling, mass and lump, right lower limb: Secondary | ICD-10-CM | POA: Diagnosis present

## 2018-10-05 DIAGNOSIS — F17210 Nicotine dependence, cigarettes, uncomplicated: Secondary | ICD-10-CM | POA: Diagnosis not present

## 2018-10-05 DIAGNOSIS — G8929 Other chronic pain: Secondary | ICD-10-CM | POA: Diagnosis not present

## 2018-10-05 DIAGNOSIS — S72141D Displaced intertrochanteric fracture of right femur, subsequent encounter for closed fracture with routine healing: Secondary | ICD-10-CM | POA: Diagnosis not present

## 2018-10-05 DIAGNOSIS — K589 Irritable bowel syndrome without diarrhea: Secondary | ICD-10-CM | POA: Diagnosis not present

## 2018-10-05 DIAGNOSIS — Z7982 Long term (current) use of aspirin: Secondary | ICD-10-CM | POA: Diagnosis not present

## 2018-10-05 MED ORDER — DOCUSATE SODIUM 100 MG PO CAPS: 100.0000 mg | ORAL_CAPSULE | Freq: Two times a day (BID) | ORAL | 0 refills | Status: DC

## 2018-10-05 MED ORDER — ACETAMINOPHEN 325 MG PO TABS: 325.0000 mg | ORAL_TABLET | Freq: Four times a day (QID) | ORAL | Status: DC | PRN

## 2018-10-05 MED ORDER — POLYETHYLENE GLYCOL 3350 17 G PO PACK: 17.0000 g | PACK | Freq: Every day | ORAL | 0 refills | Status: DC | PRN

## 2018-10-05 MED ORDER — CLONAZEPAM 1 MG PO TABS: 1.0000 mg | ORAL_TABLET | Freq: Every evening | ORAL | 0 refills | Status: DC | PRN

## 2018-10-05 MED ORDER — MORPHINE SULFATE ER 30 MG PO TBCR: 30.0000 mg | EXTENDED_RELEASE_TABLET | Freq: Three times a day (TID) | ORAL | 0 refills | Status: DC

## 2018-10-05 MED ORDER — MORPHINE SULFATE 15 MG PO TABS: 15.0000 mg | ORAL_TABLET | ORAL | 0 refills | Status: DC

## 2018-10-05 MED ORDER — ENOXAPARIN SODIUM 40 MG/0.4ML ~~LOC~~ SOLN: 40.0000 mg | SUBCUTANEOUS | Status: DC

## 2018-10-05 NOTE — Progress Notes (Signed)
  Subjective:  POD #4 s/p IM fixation for left IT hip fracture.  Patient reports left hip pain as mild.  Patient OOB to chair.  Objective:   VITALS:   Vitals:   10/04/18 2321 10/05/18 0628 10/05/18 0734 10/05/18 0925  BP: 116/64  118/72 (!) 96/57  Pulse: (!) 103  84 100  Resp: 16  16   Temp: 99.2 F (37.3 C)  98.6 F (37 C)   TempSrc: Oral  Oral   SpO2: 93%  (!) 88% 96%  Weight:  52.2 kg    Height:        PHYSICAL EXAM: Left lower extremity: Neurovascular intact Sensation intact distally Intact pulses distally Dorsiflexion/Plantar flexion intact Incision: scant drainage No cellulitis present Compartment soft  LABS  No results found for this or any previous visit (from the past 24 hour(s)).  No results found.  Assessment/Plan: 4 Days Post-Op   Active Problems:   Hip fracture [facility])   Patient will be discharged to SNF today.   Continue lovenox for 2 weeks.  Follow up in our office in 10-14 days.   Thornton Park , MD 10/05/2018, 2:13 PM

## 2018-10-05 NOTE — Progress Notes (Addendum)
Physical Therapy Treatment Patient Details Name: Denise King MRN: [id number] DOB: Sep 19, 1951 Today's Date: 10/05/2018    History of Present Illness 67yo female who comes [facility] on 10/5 after sustaining a fall at home, and right hip pain. Pt underwent R hip ORIF IM nail on 10/5. PMH: tobacco use, chronic pain w/ opite dependence, depression, vertigo, GERD, and lumbar fusion L4-S1. PTA pt AMB mostly limited household distances without AD, reports 2 falls in past 3 months.     PT Comments    Pt continues to make progress with therapy but experiences high levels of pain with transfers and ambulation. She is able to complete all seated exercises as instructed by therapist. Pt is again able to ambulate short distance but with guarded gait, decreased weight shifting to RLE, and increase in pain. Pt provided cues for proper sequencing with walker. After short distance pt complains of feeling dizzy and lightheaded. Pt assisted into recliner that is being transported by PT tech behind pt. Vitals obtained and BP is low. RN notified and vitals documented in flow sheet.  Pt will benefit from PT services to address deficits in strength, balance, and mobility in order to return to full function at home after SNF placement.    Follow Up Recommendations  SNF     Equipment Recommendations       Recommendations for Other Services       Precautions / Restrictions Precautions Precautions: Fall Restrictions Weight Bearing Restrictions: Yes RLE Weight Bearing: Weight bearing as tolerated    Mobility  Bed Mobility               General bed mobility comments: Received and left upright in recliner  Transfers Overall transfer level: Needs assistance Equipment used: Rolling walker (2 wheeled) Transfers: Sit to/from Stand Sit to Stand: Min assist         General transfer comment: Pt still needing light assist to fully shift weight forward and stabilize once standing.    Ambulation/Gait Ambulation/Gait assistance: Min assist Gait Distance (Feet): 12 Feet Assistive device: Rolling walker (2 wheeled)       General Gait Details: Pt is again able to ambulate short distance but with guarded gait, decreased weight shifting to RLE, and increase in pain. Pt provided cues for proper sequencing with walker. After short distance pt complains of feeling dizzy and lightheaded. Pt assisted into recliner that is being transported by PT tech behind pt. Vitals obtained and BP is low. RN notified and vitals documented in flow sheet   Stairs             Wheelchair Mobility    Modified Rankin (Stroke Patients Only)       Balance Overall balance assessment: Needs assistance Sitting-balance support: Bilateral upper extremity supported Sitting balance-Leahy Scale: Fair     Standing balance support: Bilateral upper extremity supported Standing balance-Leahy Scale: Poor                              Cognition Arousal/Alertness: Awake/alert Behavior During Therapy: WFL for tasks assessed/performed Overall Cognitive Status: Within Functional Limits for tasks assessed                                        Exercises General Exercises - Lower Extremity Ankle Circles/Pumps: AROM;10 reps Quad Sets: Strengthening;10 reps Gluteal Sets: Strengthening;10 reps Long  Arc Quad: Right;10 reps Heel Slides: Right;10 reps;Seated Hip ABduction/ADduction: Right;10 reps Hip Flexion/Marching: Both;10 reps    General Comments        Pertinent Vitals/Pain Pain Assessment: 0-10 Pain Score: 8  Pain Location: lateral R hip pain and some newer onset tub anterior soreness Pain Descriptors / Indicators: Grimacing;Sharp Pain Intervention(s): Premedicated before session    Home Living                      Prior Function            PT Goals (current goals can now be found in the care plan section) Acute Rehab PT Goals Patient  Stated Goal: Reduce pain and get back to making flower arrangements PT Goal Formulation: With patient Time For Goal Achievement: 10/16/18 Potential to Achieve Goals: Good Progress towards PT goals: Progressing toward goals    Frequency    BID      PT Plan Current plan remains appropriate    Co-evaluation              AM-PAC PT "6 Clicks" Daily Activity  Outcome Measure  Difficulty turning over in bed (including adjusting bedclothes, sheets and blankets)?: A Lot Difficulty moving from lying on back to sitting on the side of the bed? : Unable Difficulty sitting down on and standing up from a chair with arms (e.g., wheelchair, bedside commode, etc,.)?: Unable Help needed moving to and from a bed to chair (including a wheelchair)?: A Lot Help needed walking in hospital room?: A Lot Help needed climbing 3-5 steps with a railing? : Total 6 Click Score: 9    End of Session Equipment Utilized During Treatment: Gait belt Activity Tolerance: Patient limited by pain Patient left: with chair alarm set;with call bell/phone within reach;with family/visitor present Nurse Communication: Other (comment)(Low BP) PT Visit Diagnosis: Difficulty in walking, not elsewhere classified (R26.2)     Time: 7615-1834 PT Time Calculation (min) (ACUTE ONLY): 23 min  Charges:  $Gait Training: 8-22 mins $Therapeutic Exercise: 8-22 mins                     Denise King PT, DPT, GCS    Denise King 10/05/2018, 10:23 AM

## 2018-10-05 NOTE — Discharge Summary (Signed)
[place] at [facility], 67 y.o., DOB 1951-07-13, MRN [id number]. Admission date: 10/01/2018 Discharge Date 10/05/2018 Primary MD Biagio Borg, MD Admitting Physician Harrie Foreman, MD  Admission Diagnosis  Hypokalemia [E87.6] Narcotic dependence [facility]) [F11.20] Chronic pain syndrome [G89.4] Elevated hematocrit [R71.8] Closed intertrochanteric fracture of hip, right, initial encounter [facility]) [S72.141A]  Discharge Diagnosis   Active Problems: Right intertrochanteric regulated fracture Hypokalemia Polycythemia Tobacco abuse Alcohol use   Hospital Course  Patient is 67 year old admitted after a fall was noted to have a hip fracture.  She was seen by orthopedic surgery underwent repair of the fracture.  Patient tolerated the procedure without any complications.  Now she is in need of rehab which is currently being arranged.            Consults  orthopedic surgery  Significant Tests:  See full reports for all details     Dg Chest 1 View  Result Date: 10/01/2018 CLINICAL DATA:  67 y/o F; right hip pain after fall. History of hypertension and pneumonia. EXAM: CHEST  1 VIEW COMPARISON:  05/15/2018 chest radiograph. FINDINGS: Normal cardiac silhouette. Aortic atherosclerosis with calcification. Pulmonary venous hypertension. No focal consolidation. No pleural effusion or pneumothorax. No acute osseous abnormality is evident. IMPRESSION: Pulmonary venous hypertension. No focal consolidation. Aortic Atherosclerosis (ICD10-I70.0). Electronically Signed   By: Kristine Garbe M.D.   On: 10/01/2018 03:01   Dg Hip Port Unilat With Pelvis 1v Right  Result Date: 10/01/2018 CLINICAL DATA:  Right hip ORIF EXAM: DG HIP (WITH OR WITHOUT PELVIS) 1V PORT RIGHT COMPARISON:  10/01/2018 FINDINGS: Internal fixation across the right femoral intertrochanteric fracture. Anatomic alignment. No hardware complicating feature. IMPRESSION: Internal  fixation across the right femoral intertrochanteric fracture with anatomic alignment and no visible complicating feature. Electronically Signed   By: Rolm Baptise M.D.   On: 10/01/2018 15:44   Dg Hip Operative Unilat W Or W/o Pelvis Right  Result Date: 10/01/2018 CLINICAL DATA:  IM nailing RIGHT hip EXAM: OPERATIVE RIGHT HIP (WITH PELVIS IF PERFORMED) 4 VIEWS TECHNIQUE: Fluoroscopic spot image(s) were submitted for interpretation post-operatively. COMPARISON:  10/01/2018 RIGHT hip radiographs 1 minutes 8 seconds Images obtained: 4 Dose: 9.09 mGy FINDINGS: IM nail with compression screw placed across reduced intertrochanteric fracture of RIGHT femur. Bones demineralized. No new fracture, dislocation or bone destruction. Scattered atherosclerotic calcifications. IMPRESSION: Post ORIF of a reduced intertrochanteric fracture of the RIGHT femur. Electronically Signed   By: Lavonia Dana M.D.   On: 10/01/2018 15:08   Dg Hip Unilat  With Pelvis 2-3 Views Right  Result Date: 10/01/2018 CLINICAL DATA:  67 y/o  F; right hip pain after fall. EXAM: DG HIP (WITH OR WITHOUT PELVIS) 2-3V RIGHT COMPARISON:  None. FINDINGS: Acute intertrochanteric mildly displaced and angulated fracture of the right proximal femur. No hip joint dislocation. No pelvic fracture or diastasis identified. Left proximal femur and hip joint is well maintained. Partially visualized lower lumbar spine fusion hardware. Vascular calcifications noted. IMPRESSION: Acute intertrochanteric mildly displaced and angulated fracture of the right proximal femur. Electronically Signed   By: Kristine Garbe M.D.   On: 10/01/2018 02:58       Today   Subjective:   Zachery Conch patient currently denies any complaints pain under control Objective:   Blood pressure (!) 96/57, pulse 100, temperature 98.6 F (37 C), temperature source Oral, resp. rate 16, height 5\' 3"  (1.6 m), weight 52.2 kg, SpO2 96 %.  .  Intake/Output Summary (Last  24  hours) at 10/05/2018 1242 Last data filed at 10/05/2018 0900 Gross per 24 hour  Intake 720 ml  Output -  Net 720 ml    Exam VITAL SIGNS: Blood pressure (!) 96/57, pulse 100, temperature 98.6 F (37 C), temperature source Oral, resp. rate 16, height 5\' 3"  (1.6 m), weight 52.2 kg, SpO2 96 %.  GENERAL:  67 y.o.-year-old patient lying in the bed with no acute distress.  EYES: Pupils equal, round, reactive to light and accommodation. No scleral icterus. Extraocular muscles intact.  HEENT: Head atraumatic, normocephalic. Oropharynx and nasopharynx clear.  NECK:  Supple, no jugular venous distention. No thyroid enlargement, no tenderness.  LUNGS: Normal breath sounds bilaterally, no wheezing, rales,rhonchi or crepitation. No use of accessory muscles of respiration.  CARDIOVASCULAR: S1, S2 normal. No murmurs, rubs, or gallops.  ABDOMEN: Soft, nontender, nondistended. Bowel sounds present. No organomegaly or mass.  EXTREMITIES: No pedal edema, cyanosis, or clubbing.  NEUROLOGIC: Cranial nerves II through XII are intact. Muscle strength 5/5 in all extremities. Sensation intact. Gait not checked.  PSYCHIATRIC: The patient is alert and oriented x 3.  SKIN: No obvious rash, lesion, or ulcer.   Data Review     CBC w Diff:  Lab Results  Component Value Date   WBC 8.0 10/04/2018   HGB 16.5 (H) 10/04/2018   HCT 48.9 (H) 10/04/2018   PLT 184 10/04/2018   LYMPHOPCT 51 10/01/2018   MONOPCT 9 10/01/2018   EOSPCT 1 10/01/2018   BASOPCT 1 10/01/2018   CMP:  Lab Results  Component Value Date   NA 134 (L) 10/02/2018   K 3.7 10/04/2018   CL 98 10/02/2018   CO2 29 10/02/2018   BUN 10 10/02/2018   CREATININE 0.70 10/02/2018   CREATININE 0.53 03/23/2012   PROT 7.1 12/22/2017   ALBUMIN 4.1 12/22/2017   BILITOT 0.6 12/22/2017   ALKPHOS 82 12/22/2017   AST 21 12/22/2017   ALT 10 12/22/2017  .  Micro Results Recent Results (from the past 240 hour(s))  Surgical pcr screen     Status: None    Collection Time: 10/01/18  7:07 AM  Result Value Ref Range Status   MRSA, PCR NEGATIVE NEGATIVE Final   Staphylococcus aureus NEGATIVE NEGATIVE Final    Comment: (NOTE) The Xpert SA Assay (FDA approved for NASAL specimens in patients 42 years of age and older), is one component of a comprehensive surveillance program. It is not intended to diagnose infection nor to guide or monitor treatment. Performed at [facility], [address], [place], [place] [address]         Code Status Orders  (From admission, onward)         Start     Ordered   10/01/18 0459  Full code  Continuous     10/01/18 0458        Code Status History    Date Active Date Inactive Code Status Order ID Comments User Context   05/13/2018 0653 05/16/2018 1626 Full Code [id number]  Radene Gunning, NP ED           Contact information for follow-up providers    Cammie Sickle, MD Follow up in 3 week(s).   Specialties:  Internal Medicine, Oncology Why:  polycythemia Contact information: [place] [address] [phone]        Thornton Park, MD Follow up in 2 week(s).   Specialty:  Orthopedic Surgery Contact information: [place]  [address] [phone]            Contact information for after-discharge care    Destination    [facility] Preferred SNF .   Service:  Skilled Nursing Contact information: [address] [place] [phone]                  Discharge Medications   Allergies as of 10/05/2018      Reactions   Atorvastatin Nausea And Vomiting   Levofloxacin    Reaction: achilles tendon pain   Omnicef [cefdinir] Diarrhea   Uncontrollable diarrhea   Trazodone And Nefazodone    "Felt like I was going to faint"   Sulfonamide Derivatives Rash      Medication List    STOP taking these medications   lidocaine 5 % Commonly known as:  LIDODERM     TAKE these  medications   acetaminophen 325 MG tablet Commonly known as:  TYLENOL Take 1-2 tablets (325-650 mg total) by mouth every 6 (six) hours as needed for mild pain (pain score 1-3 or temp > 100.5).   clonazePAM 1 MG tablet Commonly known as:  KLONOPIN Take 1 tablet (1 mg total) by mouth at bedtime as needed for anxiety. What changed:  See the new instructions.   cyclobenzaprine 5 MG tablet Commonly known as:  FLEXERIL Take 5 mg by mouth at bedtime as needed for muscle spasms.   diphenoxylate-atropine 2.5-0.025 MG tablet Commonly known as:  LOMOTIL Take 1 tablet by mouth 4 (four) times daily as needed for diarrhea or loose stools.   docusate sodium 100 MG capsule Commonly known as:  COLACE Take 1 capsule (100 mg total) by mouth 2 (two) times daily.   DULoxetine 30 MG capsule Commonly known as:  CYMBALTA Take 90 mg by mouth daily. What changed:  Another medication with the same name was removed. Continue taking this medication, and follow the directions you see here.   enoxaparin 40 MG/0.4ML injection Commonly known as:  LOVENOX Inject 0.4 mLs (40 mg total) into the skin daily for 21 days.   folic acid 1 MG tablet Commonly known as:  FOLVITE Take 1 tablet (1 mg total) by mouth daily.   gabapentin 300 MG capsule Commonly known as:  NEURONTIN Take 300 mg by mouth 3 (three) times daily.   morphine 30 MG 12 hr tablet Commonly known as:  MS CONTIN Take 1 tablet (30 mg total) by mouth every 8 (eight) hours.   morphine 15 MG tablet Commonly known as:  MSIR Take 1 tablet (15 mg total) by mouth every 4 (four) hours.   pantoprazole 40 MG tablet Commonly known as:  PROTONIX TAKE 1 TABLET 30 MINUTES twice per day What changed:    how much to take  when to take this  additional instructions   polyethylene glycol packet Commonly known as:  MIRALAX / GLYCOLAX Take 17 g by mouth daily as needed for mild constipation.          Total Time in preparing paper work, data  evaluation and todays exam - 11 minutes  Dustin Flock M.D on 10/05/2018 at 12:42 PM [place]  [phone]

## 2018-10-05 NOTE — Progress Notes (Signed)
Per [facility] liaison [facility] authorization is still pending. MD and RN aware of above.   [employer], LCSW [phone]

## 2018-10-05 NOTE — Care Management Important Message (Signed)
Important Message  Patient Details  Name: Denise King MRN: [id number] Date of Birth: 10-Feb-1951   Medicare Important Message Given:  Yes    Juliann Pulse A Iden Stripling 10/05/2018, 11:34 AM

## 2018-10-05 NOTE — Clinical Social Work Placement (Signed)
   CLINICAL SOCIAL WORK PLACEMENT  NOTE  Date:  10/05/2018  Patient Details  Name: Denise King MRN: [id number] Date of Birth: April 13, 1951  Clinical Social Work is seeking post-discharge placement for this patient at the [place] level of care (*CSW will initial, date and re-position this form in  chart as items are completed):  Yes   Patient/family provided with [facility] Work Department's list of facilities offering this level of care within the geographic area requested by the patient (or if unable, by the patient's family).  Yes   Patient/family informed of their freedom to choose among providers that offer the needed level of care, that participate in Medicare, Medicaid or managed care program needed by the patient, have an available bed and are willing to accept the patient.  Yes   Patient/family informed of [facility]'s ownership interest in [facility] and [facility], as well as of the fact that they are under no obligation to receive care at these facilities.  PASRR submitted to EDS on 10/02/18     PASRR number received on 10/02/18     Existing PASRR number confirmed on       FL2 transmitted to all facilities in geographic area requested by pt/family on 10/02/18     FL2 transmitted to all facilities within larger geographic area on       Patient informed that his/her managed care company has contracts with or will negotiate with certain facilities, including the following:        Yes   Patient/family informed of bed offers received.  [facility] at [facility] )     Physician recommends and patient chooses bed at      Patient to be transferred to ([facility] ) on 10/05/18.  Patient to be transferred to facility by [facility] )     Patient family notified on 10/05/18 of transfer.  Name of family member notified:  (Patient's daughter Raquel Sarna and daughter in law Minette Headland are aware of D/C today.  )     PHYSICIAN      Additional Comment:    _______________________________________________ Mekiah Cambridge, Veronia Beets, LCSW 10/05/2018, 2:10 PM

## 2018-10-05 NOTE — Progress Notes (Signed)
Patient is medically stable for D/C to [facility] today. Per [facility] liaison [facility] authorization has been received and patient can come today to room 810. RN will call report and arrange EMS for transport. Clinical Education officer, museum (CSW) sent D/C orders to [facility] via [facility]. Patient is aware of above. CSW contacted patient's daughter Raquel Sarna and made her aware of above. CSW also contacted patient's daughter in law Minette Headland and made her aware of above. Please reconsult if future social work needs arise. CSW signing off.   [employer], LCSW [phone]

## 2018-10-06 ENCOUNTER — Telehealth: Payer: Self-pay | Admitting: *Deleted

## 2018-10-06 DIAGNOSIS — K219 Gastro-esophageal reflux disease without esophagitis: Secondary | ICD-10-CM | POA: Diagnosis not present

## 2018-10-06 DIAGNOSIS — S72001D Fracture of unspecified part of neck of right femur, subsequent encounter for closed fracture with routine healing: Secondary | ICD-10-CM | POA: Diagnosis not present

## 2018-10-06 DIAGNOSIS — G8929 Other chronic pain: Secondary | ICD-10-CM | POA: Diagnosis not present

## 2018-10-06 DIAGNOSIS — Z8719 Personal history of other diseases of the digestive system: Secondary | ICD-10-CM | POA: Diagnosis not present

## 2018-10-06 DIAGNOSIS — R69 Illness, unspecified: Secondary | ICD-10-CM | POA: Diagnosis not present

## 2018-10-06 NOTE — Telephone Encounter (Signed)
Pt was on TCM report admitted 10/01/18 after a fall was noted to have a hip fracture. Pt underwent surgery to repair hip fracture. Pt D/C 10/05/18 to SNF for rehab. Will follow-up w/orthopedic MD in 2 weeks.Marland KitchenJohny Chess

## 2018-10-13 DIAGNOSIS — R69 Illness, unspecified: Secondary | ICD-10-CM | POA: Diagnosis not present

## 2018-10-13 DIAGNOSIS — K5900 Constipation, unspecified: Secondary | ICD-10-CM | POA: Diagnosis not present

## 2018-10-13 DIAGNOSIS — Z8719 Personal history of other diseases of the digestive system: Secondary | ICD-10-CM | POA: Diagnosis not present

## 2018-10-13 DIAGNOSIS — G8929 Other chronic pain: Secondary | ICD-10-CM | POA: Diagnosis not present

## 2018-10-13 DIAGNOSIS — K219 Gastro-esophageal reflux disease without esophagitis: Secondary | ICD-10-CM | POA: Diagnosis not present

## 2018-10-17 DIAGNOSIS — S72009A Fracture of unspecified part of neck of unspecified femur, initial encounter for closed fracture: Secondary | ICD-10-CM | POA: Insufficient documentation

## 2018-10-17 DIAGNOSIS — S72001D Fracture of unspecified part of neck of right femur, subsequent encounter for closed fracture with routine healing: Secondary | ICD-10-CM | POA: Diagnosis not present

## 2018-10-19 ENCOUNTER — Emergency Department: Payer: Medicare HMO

## 2018-10-19 ENCOUNTER — Emergency Department
Admission: EM | Admit: 2018-10-19 | Discharge: 2018-10-19 | Disposition: A | Discharge status: Home / Self Care | Payer: Medicare HMO | Attending: Emergency Medicine | Admitting: Emergency Medicine

## 2018-10-19 ENCOUNTER — Other Ambulatory Visit: Payer: Self-pay

## 2018-10-19 DIAGNOSIS — F1020 Alcohol dependence, uncomplicated: Secondary | ICD-10-CM | POA: Insufficient documentation

## 2018-10-19 DIAGNOSIS — R600 Localized edema: Secondary | ICD-10-CM

## 2018-10-19 DIAGNOSIS — R69 Illness, unspecified: Secondary | ICD-10-CM | POA: Diagnosis not present

## 2018-10-19 DIAGNOSIS — Z7982 Long term (current) use of aspirin: Secondary | ICD-10-CM | POA: Insufficient documentation

## 2018-10-19 DIAGNOSIS — R609 Edema, unspecified: Secondary | ICD-10-CM

## 2018-10-19 DIAGNOSIS — I10 Essential (primary) hypertension: Secondary | ICD-10-CM | POA: Diagnosis not present

## 2018-10-19 DIAGNOSIS — Z79899 Other long term (current) drug therapy: Secondary | ICD-10-CM | POA: Insufficient documentation

## 2018-10-19 DIAGNOSIS — F329 Major depressive disorder, single episode, unspecified: Secondary | ICD-10-CM | POA: Insufficient documentation

## 2018-10-19 DIAGNOSIS — F419 Anxiety disorder, unspecified: Secondary | ICD-10-CM | POA: Insufficient documentation

## 2018-10-19 DIAGNOSIS — M7989 Other specified soft tissue disorders: Secondary | ICD-10-CM | POA: Diagnosis not present

## 2018-10-19 DIAGNOSIS — F17210 Nicotine dependence, cigarettes, uncomplicated: Secondary | ICD-10-CM | POA: Insufficient documentation

## 2018-10-19 LAB — CBC WITH DIFFERENTIAL/PLATELET
Abs Immature Granulocytes: 0.01 10*3/uL (ref 0.00–0.07)
Basophils Absolute: 0 10*3/uL (ref 0.0–0.1)
Basophils Relative: 1 %
Eosinophils Absolute: 0.1 10*3/uL (ref 0.0–0.5)
Eosinophils Relative: 1 %
HCT: 47.1 % — ABNORMAL HIGH (ref 36.0–46.0)
Hemoglobin: 15.4 g/dL — ABNORMAL HIGH (ref 12.0–15.0)
Immature Granulocytes: 0 %
Lymphocytes Relative: 45 %
Lymphs Abs: 2.8 10*3/uL (ref 0.7–4.0)
MCH: 33.8 pg (ref 26.0–34.0)
MCHC: 32.7 g/dL (ref 30.0–36.0)
MCV: 103.3 fL — ABNORMAL HIGH (ref 80.0–100.0)
Monocytes Absolute: 0.8 10*3/uL (ref 0.1–1.0)
Monocytes Relative: 12 %
Neutro Abs: 2.5 10*3/uL (ref 1.7–7.7)
Neutrophils Relative %: 41 %
Platelets: 423 10*3/uL — ABNORMAL HIGH (ref 150–400)
RBC: 4.56 MIL/uL (ref 3.87–5.11)
RDW: 13 % (ref 11.5–15.5)
WBC: 6.2 10*3/uL (ref 4.0–10.5)
nRBC: 0 % (ref 0.0–0.2)

## 2018-10-19 LAB — BASIC METABOLIC PANEL
Anion gap: 8 (ref 5–15)
BUN: 11 mg/dL (ref 8–23)
CO2: 31 mmol/L (ref 22–32)
Calcium: 8.7 mg/dL — ABNORMAL LOW (ref 8.9–10.3)
Chloride: 97 mmol/L — ABNORMAL LOW (ref 98–111)
Creatinine, Ser: 0.62 mg/dL (ref 0.44–1.00)
GFR calc Af Amer: >60 mL/min (ref >60)
GFR calc non Af Amer: >60 mL/min (ref >60)
Glucose, Bld: 90 mg/dL (ref 70–99)
Potassium: 4.2 mmol/L (ref 3.5–5.1)
Sodium: 136 mmol/L (ref 135–145)

## 2018-10-19 LAB — FIBRIN DERIVATIVES D-DIMER (ARMC ONLY): Fibrin derivatives D-dimer (ARMC): 1997.04 ng/mL (FEU) — ABNORMAL HIGH (ref 0.00–499.00)

## 2018-10-19 LAB — PROTIME-INR
INR: 0.82
Prothrombin Time: 11.2 seconds — ABNORMAL LOW (ref 11.4–15.2)

## 2018-10-19 MED ORDER — MORPHINE SULFATE 15 MG PO TABS
15.0000 mg | ORAL_TABLET | Freq: Once | ORAL | Status: AC
  Administered 2018-10-19: 15 mg via ORAL

## 2018-10-19 NOTE — ED Notes (Signed)
Patient transported to Ultrasound 

## 2018-10-19 NOTE — ED Provider Notes (Signed)
[facility] Emergency Department Provider Note ____________________________________________   First MD Initiated Contact with Patient 10/19/18 1547     (approximate)  I have reviewed the triage vital signs and the nursing notes.   HISTORY  Chief Complaint Leg Swelling    HPI Denise King is a 67 y.o. female with PMH as noted below and status post right hip surgery 2 weeks ago who presents with right lower leg swelling over the last several days, gradual onset, associated pain mainly behind her knee, and not associated with any new trauma.  No numbness or weakness.  No associated chest pain or shortness of breath.  Past Medical History:  Diagnosis Date  . Acute cystitis   . Acute respiratory failure ([place])   . Allergy    as a child grew out of them  . Anemia    pernicious anemia  . Anxiety   . Arthritis   . Back pain   . Blood transfusion   . CAP (community acquired pneumonia)   . Chronic pain syndrome   . Depression   . Diverticulosis of colon   . Dysthymia   . Esophagitis   . EtOH dependence ([place])   . Gastritis   . GERD (gastroesophageal reflux disease)   . H/O chest pain Dec. 2013   no work up done  . Hx of colonic polyps   . Hypertension   . Irritable bowel syndrome   . Osteopenia   . Osteoporosis   . Pneumonia 2019  . Polypharmacy   . Reflux esophagitis   . Right sided sciatica 12/22/2017  . Tobacco use disorder   . Unspecified chronic bronchitis ([place])   . Vertigo   . Vitamin B12 deficiency   . Wears glasses     Patient Active Problem List   Diagnosis Date Noted  . Hip fracture ([place]) 10/01/2018  . Dysuria 05/20/2018  . Right leg swelling 05/20/2018  . Acute respiratory failure ([place]) 05/13/2018  . CAP (community acquired pneumonia) 05/13/2018  . Tachycardia 05/13/2018  . Chest wall pain 05/13/2018  . Narcotic poisoning ([place]) 05/13/2018  . EtOH dependence ([place])   . Polypharmacy   . Esophagitis   . Encounter for  well adult exam with abnormal findings 12/22/2017  . Polycythemia 12/22/2017  . Depression 12/22/2017  . Hyperglycemia 12/22/2017  . Right sided sciatica 12/22/2017  . Nausea & vomiting 07/17/2017  . Cough 06/10/2017  . Diarrhea 04/22/2016  . Family history of colon cancer - brother and father 03/10/2016  . Insomnia 02/11/2016  . Generalized anxiety disorder 11/25/2015  . Urinary frequency 04/25/2015  . Nausea and vomiting in adult 11/08/2014  . Cigarette smoker 08/15/2013  . Otitis media 10/27/2012  . Abdominal pain, generalized 03/30/2012  . HEPATIC CYST 02/18/2010  . Chronic pain syndrome 02/08/2010  . Vitamin B12 deficiency 10/04/2009  . GERD 01/04/2009  . ABDOMINAL PAIN-EPIGASTRIC 01/04/2009  . DYSTHYMIA 08/10/2008  . Venous (peripheral) insufficiency 08/10/2008  . Irritable bowel syndrome 08/10/2008  . Back pain 08/10/2008  . CIGARETTE SMOKER 03/06/2008  . Hx of adenomatous polyp of colon 12/06/2007  . BRONCHITIS, RECURRENT 12/06/2007  . DIVERTICULOSIS OF COLON 12/06/2007  . OSTEOPENIA 12/06/2007  . CALCULUS, KIDNEY 10/07/2007  . VERTIGO 10/07/2007  . Headache(784.0) 10/07/2007    Past Surgical History:  Procedure Laterality Date  . APPENDECTOMY    . BACK SURGERY  10-09   Dr. Patrice Paradise  . CARPAL TUNNEL RELEASE Right 08/14/2014   Procedure: RIGHT CARPAL TUNNEL RELEASE AND INJECT  LEFT THUMB;  Surgeon: Daryll Brod, MD;  Location: [place];  Service: Orthopedics;  Laterality: Right;  . COLONOSCOPY    . INTRAMEDULLARY (IM) NAIL INTERTROCHANTERIC Right 10/01/2018   Procedure: INTRAMEDULLARY (IM) NAIL INTERTROCHANTRIC;  Surgeon: Thornton Park, MD;  Location: [facility];  Service: Orthopedics;  Laterality: Right;  . LAPAROSCOPY N/A 02/21/2013   Procedure: LAPAROSCOPY OPERATIVE;  Surgeon: Margarette Asal, MD;  Location: [facility];  Service: Gynecology;  Laterality: N/A;  REQUESTING 5MM SCOPE WITH CAMERA  . TONSILLECTOMY    . TUBAL LIGATION    . UPPER  GASTROINTESTINAL ENDOSCOPY      Prior to Admission medications   Medication Sig Start Date End Date Taking? Authorizing Provider  acetaminophen (TYLENOL) 325 MG tablet Take 1-2 tablets (325-650 mg total) by mouth every 6 (six) hours as needed for mild pain (pain score 1-3 or temp > 100.5). 10/05/18  Yes Dustin Flock, MD  aspirin 325 MG EC tablet Take 325 mg by mouth daily.   Yes [provider]  clonazePAM (KLONOPIN) 1 MG tablet Take 1 tablet (1 mg total) by mouth at bedtime as needed for anxiety. 10/05/18  Yes Dustin Flock, MD  cyclobenzaprine (FLEXERIL) 5 MG tablet Take 5 mg by mouth at bedtime as needed for muscle spasms.   Yes [provider]  diphenoxylate-atropine (LOMOTIL) 2.5-0.025 MG tablet Take 1 tablet by mouth 4 (four) times daily as needed for diarrhea or loose stools. 08/15/18  Yes Gatha Mayer, MD  docusate sodium (COLACE) 100 MG capsule Take 1 capsule (100 mg total) by mouth 2 (two) times daily. 10/05/18  Yes Dustin Flock, MD  DULoxetine (CYMBALTA) 30 MG capsule Take 90 mg by mouth daily.   Yes [provider]  folic acid (FOLVITE) 1 MG tablet Take 1 tablet (1 mg total) by mouth daily. 09/05/18  Yes Biagio Borg, MD  gabapentin (NEURONTIN) 300 MG capsule Take 300 mg by mouth 4 (four) times daily.  04/27/18  Yes [provider]  hyoscyamine (ANASPAZ) 0.125 MG TBDP disintergrating tablet Place 0.125 mg under the tongue every 6 (six) hours as needed.   Yes [provider]  morphine (MS CONTIN) 30 MG 12 hr tablet Take 1 tablet (30 mg total) by mouth every 8 (eight) hours. 10/05/18  Yes Dustin Flock, MD  morphine (MSIR) 15 MG tablet Take 1 tablet (15 mg total) by mouth every 4 (four) hours. 10/05/18  Yes Dustin Flock, MD  polyethylene glycol (MIRALAX / GLYCOLAX) packet Take 17 g by mouth daily as needed for mild constipation. 10/05/18  Yes Dustin Flock, MD  senna (SENOKOT) 8.6 MG TABS tablet Take 1 tablet by mouth 2 (two) times  daily as needed for mild constipation.   Yes [provider]  enoxaparin (LOVENOX) 40 MG/0.4ML injection Inject 0.4 mLs (40 mg total) into the skin daily for 21 days. Patient not taking: Reported on 10/19/2018 10/05/18 10/26/18  Dustin Flock, MD  pantoprazole (PROTONIX) 40 MG tablet TAKE 1 TABLET 30 MINUTES twice per day Patient not taking: Reported on 10/19/2018 06/24/18   Biagio Borg, MD    Allergies Atorvastatin; Levofloxacin; Omnicef [cefdinir]; Trazodone and nefazodone; and Sulfonamide derivatives  Family History  Problem Relation Age of Onset  . Colon cancer Father   . Prostate cancer Father   . Heart disease Mother        prev MVR, also has DJD  . Colon cancer Brother   . Skin cancer Sister   . Esophageal cancer Neg  Hx   . Rectal cancer Neg Hx   . Stomach cancer Neg Hx     Social History Social History   Tobacco Use  . Smoking status: Current Every Day Smoker    Packs/day: 1.00    Years: 30.00    Pack years: 30.00    Types: Cigarettes  . Smokeless tobacco: Never Used  Substance Use Topics  . Alcohol use: Yes    Alcohol/week: 0.0 standard drinks    Comment: occasional  . Drug use: No    Review of Systems  Constitutional: No fever. Eyes: No redness. ENT: No sore throat. Cardiovascular: Denies chest pain. Respiratory: Denies shortness of breath. Gastrointestinal: No vomiting.  Genitourinary: Negative for flank pain.  Musculoskeletal: Negative for back pain.  Positive for right leg pain. Skin: Negative for rash. Neurological: Negative for focal weakness or numbness.   ____________________________________________   PHYSICAL EXAM:  VITAL SIGNS: ED Triage Vitals [10/19/18 1544]  Enc Vitals Group     BP (!) 141/85     Pulse Rate 92     Resp 18     Temp 98.2 F (36.8 C)     Temp Source Oral     SpO2 96 %     Weight 119 lb (54 kg)     Height 5\' 3"  (1.6 m)     Head Circumference      [place] Flow      Pain Score 3     Pain Loc      Pain  Edu?      Excl. in [place]?     Constitutional: Alert and oriented.  Relatively well appearing and in no acute distress. Eyes: Conjunctivae are normal.  Head: Atraumatic. Nose: No congestion/rhinnorhea. Mouth/Throat: Mucous membranes are moist.   Neck: Normal range of motion.  Cardiovascular: Normal rate, regular rhythm. Good peripheral circulation. Respiratory: Normal respiratory effort.  No retractions.  Gastrointestinal: No distention.  Musculoskeletal: Trace right lower extremity edema.  Right popliteal area mild tenderness.  Extremities warm and well perfused.  Neurologic:  Normal speech and language. No gross focal neurologic deficits are appreciated.  Skin:  Skin is warm and dry. No rash noted. Psychiatric: Mood and affect are normal. Speech and behavior are normal.  ____________________________________________   LABS (all labs ordered are listed, but only abnormal results are displayed)  Labs Reviewed  BASIC METABOLIC PANEL - Abnormal; Notable for the following components:      Result Value   Chloride 97 (*)    Calcium 8.7 (*)    All other components within normal limits  CBC WITH DIFFERENTIAL/PLATELET - Abnormal; Notable for the following components:   Hemoglobin 15.4 (*)    HCT 47.1 (*)    MCV 103.3 (*)    Platelets 423 (*)    All other components within normal limits  PROTIME-INR - Abnormal; Notable for the following components:   Prothrombin Time 11.2 (*)    All other components within normal limits  FIBRIN DERIVATIVES D-DIMER ([facility] ONLY) - Abnormal; Notable for the following components:   Fibrin derivatives D-dimer [facility]) 7,062.37 (*)    All other components within normal limits   ____________________________________________  EKG   ____________________________________________  RADIOLOGY  [place] RLE: No acute DVT  ____________________________________________   PROCEDURES  Procedure(s) performed: No  Procedures  Critical Care performed:  No ____________________________________________   INITIAL IMPRESSION / ASSESSMENT AND PLAN / ED COURSE  Pertinent labs & imaging results that were available during my care of the patient were reviewed by  me and considered in my medical decision making (see chart for details).  67 year old female with PMH as noted above and status post right hip IM nail surgery 2 weeks ago presents with new onset right leg swelling and pain over the last several days, mainly concentrated in the popliteal region.  She denies associated chest pain or shortness of breath.  On exam the patient's vital signs are normal.  She has mild swelling to the right lower leg and some tenderness in the popliteal and distal posterior thigh area.  Overall I am concerned for DVT versus dependent edema.  We will obtain labs and ultrasound to rule out DVT.  ----------------------------------------- 5:50 PM on 10/19/2018 -----------------------------------------  The patient's lab work-up is unremarkable except for elevated d-dimer, however this is is nonspecific and not unusual in a patient after recent surgery.  The DVT study is negative.  Overall I suspect peripheral edema related to her surgery.  I advised that she should attempt to elevate the leg whenever possible.  She should follow-up with her regular doctor.  She is stable for discharge back to her rehab at this time.  Return precautions given, and she expresses understanding.  ____________________________________________   FINAL CLINICAL IMPRESSION(S) / ED DIAGNOSES  Final diagnoses:  Peripheral edema      NEW MEDICATIONS STARTED DURING THIS VISIT:  New Prescriptions   No medications on file     Note:  This document was prepared using Dragon voice recognition software and may include unintentional dictation errors.    Arta Silence, MD 10/19/18 1751

## 2018-10-19 NOTE — ED Triage Notes (Signed)
To ER via [facility] from [facility] c/o right leg swelling. Pt had femur fracture earlier this month. Leg swelling began yesterday. Pt alert and oriented X4, active, cooperative, pt in NAD. RR even and unlabored, color WNL.

## 2018-10-19 NOTE — Discharge Instructions (Addendum)
Your ultrasound shows no signs of a blood clot.  Keep the leg elevated above the heart when you are at rest.  Return to the ER for new, worsening, persistent severe swelling, worsening pain, weakness or numbness, chest pain or difficulty breathing, or any other new or worsening symptoms that concern you.

## 2018-10-20 DIAGNOSIS — R69 Illness, unspecified: Secondary | ICD-10-CM | POA: Diagnosis not present

## 2018-10-20 DIAGNOSIS — G8929 Other chronic pain: Secondary | ICD-10-CM | POA: Diagnosis not present

## 2018-10-20 DIAGNOSIS — K219 Gastro-esophageal reflux disease without esophagitis: Secondary | ICD-10-CM | POA: Diagnosis not present

## 2018-10-20 DIAGNOSIS — S72001D Fracture of unspecified part of neck of right femur, subsequent encounter for closed fracture with routine healing: Secondary | ICD-10-CM | POA: Diagnosis not present

## 2018-10-20 DIAGNOSIS — Z8719 Personal history of other diseases of the digestive system: Secondary | ICD-10-CM | POA: Diagnosis not present

## 2018-10-26 ENCOUNTER — Inpatient Hospital Stay: Payer: Medicare HMO

## 2018-10-26 ENCOUNTER — Telehealth: Payer: Self-pay | Admitting: Internal Medicine

## 2018-10-26 ENCOUNTER — Inpatient Hospital Stay: Payer: Medicare HMO | Attending: Internal Medicine | Admitting: Internal Medicine

## 2018-10-26 ENCOUNTER — Encounter: Payer: Self-pay | Admitting: Internal Medicine

## 2018-10-26 VITALS — BP 128/79 | HR 76 | Temp 97.8°F | Resp 16

## 2018-10-26 DIAGNOSIS — Z79899 Other long term (current) drug therapy: Secondary | ICD-10-CM | POA: Diagnosis not present

## 2018-10-26 DIAGNOSIS — Z8042 Family history of malignant neoplasm of prostate: Secondary | ICD-10-CM | POA: Insufficient documentation

## 2018-10-26 DIAGNOSIS — I10 Essential (primary) hypertension: Secondary | ICD-10-CM | POA: Diagnosis not present

## 2018-10-26 DIAGNOSIS — Z800 Family history of malignant neoplasm of digestive organs: Secondary | ICD-10-CM | POA: Insufficient documentation

## 2018-10-26 DIAGNOSIS — D7589 Other specified diseases of blood and blood-forming organs: Secondary | ICD-10-CM | POA: Insufficient documentation

## 2018-10-26 DIAGNOSIS — M810 Age-related osteoporosis without current pathological fracture: Secondary | ICD-10-CM | POA: Diagnosis not present

## 2018-10-26 DIAGNOSIS — Z7982 Long term (current) use of aspirin: Secondary | ICD-10-CM | POA: Insufficient documentation

## 2018-10-26 DIAGNOSIS — E538 Deficiency of other specified B group vitamins: Secondary | ICD-10-CM | POA: Diagnosis not present

## 2018-10-26 DIAGNOSIS — D751 Secondary polycythemia: Secondary | ICD-10-CM | POA: Insufficient documentation

## 2018-10-26 DIAGNOSIS — K219 Gastro-esophageal reflux disease without esophagitis: Secondary | ICD-10-CM | POA: Diagnosis not present

## 2018-10-26 DIAGNOSIS — F17210 Nicotine dependence, cigarettes, uncomplicated: Secondary | ICD-10-CM | POA: Insufficient documentation

## 2018-10-26 DIAGNOSIS — R69 Illness, unspecified: Secondary | ICD-10-CM | POA: Diagnosis not present

## 2018-10-26 LAB — COMPREHENSIVE METABOLIC PANEL
ALT: 11 U/L (ref 0–44)
AST: 23 U/L (ref 15–41)
Albumin: 3.5 g/dL (ref 3.5–5.0)
Alkaline Phosphatase: 85 U/L (ref 38–126)
Anion gap: 9 (ref 5–15)
BUN: 11 mg/dL (ref 8–23)
CO2: 31 mmol/L (ref 22–32)
Calcium: 9.1 mg/dL (ref 8.9–10.3)
Chloride: 96 mmol/L — ABNORMAL LOW (ref 98–111)
Creatinine, Ser: 0.76 mg/dL (ref 0.44–1.00)
GFR calc Af Amer: >60 mL/min (ref >60)
GFR calc non Af Amer: >60 mL/min (ref >60)
Glucose, Bld: 91 mg/dL (ref 70–99)
Potassium: 4.4 mmol/L (ref 3.5–5.1)
Sodium: 136 mmol/L (ref 135–145)
Total Bilirubin: 0.8 mg/dL (ref 0.3–1.2)
Total Protein: 7.4 g/dL (ref 6.5–8.1)

## 2018-10-26 LAB — CBC WITH DIFFERENTIAL/PLATELET
Abs Immature Granulocytes: 0.01 10*3/uL (ref 0.00–0.07)
Basophils Absolute: 0.1 10*3/uL (ref 0.0–0.1)
Basophils Relative: 1 %
Eosinophils Absolute: 0.1 10*3/uL (ref 0.0–0.5)
Eosinophils Relative: 2 %
HCT: 44.6 % (ref 36.0–46.0)
Hemoglobin: 14.6 g/dL (ref 12.0–15.0)
Immature Granulocytes: 0 %
Lymphocytes Relative: 40 %
Lymphs Abs: 2.8 10*3/uL (ref 0.7–4.0)
MCH: 33.1 pg (ref 26.0–34.0)
MCHC: 32.7 g/dL (ref 30.0–36.0)
MCV: 101.1 fL — ABNORMAL HIGH (ref 80.0–100.0)
Monocytes Absolute: 0.8 10*3/uL (ref 0.1–1.0)
Monocytes Relative: 11 %
Neutro Abs: 3.2 10*3/uL (ref 1.7–7.7)
Neutrophils Relative %: 46 %
Platelets: 261 10*3/uL (ref 150–400)
RBC: 4.41 MIL/uL (ref 3.87–5.11)
RDW: 12.6 % (ref 11.5–15.5)
WBC: 7 10*3/uL (ref 4.0–10.5)
nRBC: 0 % (ref 0.0–0.2)

## 2018-10-26 LAB — RETICULOCYTES
Immature Retic Fract: 4 % (ref 2.3–15.9)
RBC.: 4.41 MIL/uL (ref 3.87–5.11)
Retic Count, Absolute: 58.2 10*3/uL (ref 19.0–186.0)
Retic Ct Pct: 1.3 % (ref 0.4–3.1)

## 2018-10-26 LAB — VITAMIN B12: Vitamin B-12: 284 pg/mL (ref 180–914)

## 2018-10-26 LAB — LACTATE DEHYDROGENASE: LDH: 176 U/L (ref 98–192)

## 2018-10-26 NOTE — Telephone Encounter (Signed)
My chart message sent.   #Colette/[place]-please schedule appointment in 6 months labs/CBC BMP. Thx

## 2018-10-26 NOTE — Progress Notes (Signed)
[facility] NOTE  Patient Care Team: Biagio Borg, MD as PCP - General (Internal Medicine)  CHIEF COMPLAINTS/PURPOSE OF CONSULTATION:  Erythrocytosis  #  ERYTHROCYTOSIS [name] since 2009]  # Hx of Pernicious anemia  # Smoker- quit oct 2019;   No history exists.     HISTORY OF PRESENTING ILLNESS:  Denise King 67 y.o.  female long-standing history of smoking currently referred to [place] for further evaluation recommendations for erythrocytosis.  Patient is currently wheelchair; rehab after recent fall right lower extremity fracture/ORIF.  Most recent blood work shows hemoglobin 16-18 and hematocrit 47 also.  However patient noted to have erythrocytosis for the last 9 years.  Also noted to have mild macrocytosis.  Denies any alcohol abuse.  Blood clots/DVT PEs or strokes- none. Smoking-chronic smoker; quit this month.  Patient denies any unusual headaches.  Denies any unusual fatigue.  No weight loss.  Review of Systems  Constitutional: Negative for chills, diaphoresis, fever, malaise/fatigue and weight loss.  HENT: Negative for nosebleeds and sore throat.   Eyes: Negative for double vision.  Respiratory: Negative for cough, hemoptysis, sputum production, shortness of breath and wheezing.   Cardiovascular: Negative for chest pain, palpitations, orthopnea and leg swelling.  Gastrointestinal: Negative for abdominal pain, blood in stool, constipation, diarrhea, heartburn, melena, nausea and vomiting.  Genitourinary: Negative for dysuria, frequency and urgency.  Musculoskeletal: Negative for back pain and joint pain.  Skin: Negative.  Negative for itching and rash.  Neurological: Negative for dizziness, tingling, focal weakness, weakness and headaches.  Endo/Heme/Allergies: Does not bruise/bleed easily.  Psychiatric/Behavioral: Negative for depression. The patient is not nervous/anxious and does not have insomnia.      MEDICAL HISTORY:  Past Medical  History:  Diagnosis Date  . Acute cystitis   . Acute respiratory failure ([place])   . Allergy    as a child grew out of them  . Anemia    pernicious anemia  . Anxiety   . Arthritis   . Back pain   . Blood transfusion   . CAP (community acquired pneumonia)   . Chronic pain syndrome   . Depression   . Diverticulosis of colon   . Dysthymia   . Esophagitis   . EtOH dependence ([place])   . Gastritis   . GERD (gastroesophageal reflux disease)   . H/O chest pain Dec. 2013   no work up done  . Hx of colonic polyps   . Hypertension   . Irritable bowel syndrome   . Osteopenia   . Osteoporosis   . Pneumonia 2019  . Polypharmacy   . Reflux esophagitis   . Right sided sciatica 12/22/2017  . Tobacco use disorder   . Unspecified chronic bronchitis ([place])   . Vertigo   . Vitamin B12 deficiency   . Wears glasses     SURGICAL HISTORY: Past Surgical History:  Procedure Laterality Date  . APPENDECTOMY    . BACK SURGERY  10-09   Dr. Patrice Paradise  . CARPAL TUNNEL RELEASE Right 08/14/2014   Procedure: RIGHT CARPAL TUNNEL RELEASE AND INJECT LEFT THUMB;  Surgeon: Daryll Brod, MD;  Location: [facility];  Service: Orthopedics;  Laterality: Right;  . COLONOSCOPY    . INTRAMEDULLARY (IM) NAIL INTERTROCHANTERIC Right 10/01/2018   Procedure: INTRAMEDULLARY (IM) NAIL INTERTROCHANTRIC;  Surgeon: Thornton Park, MD;  Location: [facility];  Service: Orthopedics;  Laterality: Right;  . LAPAROSCOPY N/A 02/21/2013   Procedure: LAPAROSCOPY OPERATIVE;  Surgeon: Margarette Asal, MD;  Location:  [facility];  Service: Gynecology;  Laterality: N/A;  REQUESTING 5MM SCOPE WITH CAMERA  . TONSILLECTOMY    . TUBAL LIGATION    . UPPER GASTROINTESTINAL ENDOSCOPY      SOCIAL HISTORY: Social History   Socioeconomic History  . Marital status: Married    Spouse name: Not on file  . Number of children: 4  . Years of education: 13  . Highest education level: Not on file  Occupational History  . Occupation:  retired    Comment: disablility  Social Needs  . Financial resource strain: Not on file  . Food insecurity:    Worry: Not on file    Inability: Not on file  . Transportation needs:    Medical: Not on file    Non-medical: Not on file  Tobacco Use  . Smoking status: Current Every Day Smoker    Packs/day: 1.00    Years: 30.00    Pack years: 30.00    Types: Cigarettes  . Smokeless tobacco: Never Used  Substance and Sexual Activity  . Alcohol use: Yes    Alcohol/week: 0.0 standard drinks    Comment: occasional  . Drug use: No  . Sexual activity: Not on file  Lifestyle  . Physical activity:    Days per week: Not on file    Minutes per session: Not on file  . Stress: Not on file  Relationships  . Social connections:    Talks on phone: Not on file    Gets together: Not on file    Attends religious service: Not on file    Active member of club or organization: Not on file    Attends meetings of clubs or organizations: Not on file    Relationship status: Not on file  . Intimate partner violence:    Fear of current or ex partner: Not on file    Emotionally abused: Not on file    Physically abused: Not on file    Forced sexual activity: Not on file  Other Topics Concern  . Not on file  Social History Narrative   Married x 38 yrs. Has BS degree in Horticulture from [place]. Currently resides in a house with her husband. 3 cats.  Fun: used to like to do a lot of things.    Denies religious beliefs that would effect health care.        Currently in rehab; lives with husband [place]; quit smoking- October 6th, 2019. ocasional alcohol. [employer] [2009] on disability sec to back issues.     FAMILY HISTORY: Family History  Problem Relation Age of Onset  . Colon cancer Father   . Prostate cancer Father   . Heart disease Mother        prev MVR, also has DJD  . Colon cancer Brother   . Skin cancer Sister   . Esophageal cancer Neg Hx   . Rectal cancer Neg Hx   .  Stomach cancer Neg Hx     ALLERGIES:  is allergic to atorvastatin; levofloxacin; omnicef [cefdinir]; trazodone and nefazodone; and sulfonamide derivatives.  MEDICATIONS:  Current Outpatient Medications  Medication Sig Dispense Refill  . acetaminophen (TYLENOL) 325 MG tablet Take 1-2 tablets (325-650 mg total) by mouth every 6 (six) hours as needed for mild pain (pain score 1-3 or temp > 100.5).    Marland Kitchen aspirin 325 MG EC tablet Take 325 mg by mouth daily.    . clonazePAM (KLONOPIN) 1 MG tablet Take 1 tablet (1 mg total) by  mouth at bedtime as needed for anxiety. 20 tablet 0  . cyclobenzaprine (FLEXERIL) 5 MG tablet Take 5 mg by mouth at bedtime as needed for muscle spasms.    . diphenoxylate-atropine (LOMOTIL) 2.5-0.025 MG tablet Take 1 tablet by mouth 4 (four) times daily as needed for diarrhea or loose stools. 60 tablet 0  . docusate sodium (COLACE) 100 MG capsule Take 1 capsule (100 mg total) by mouth 2 (two) times daily. 10 capsule 0  . DULoxetine (CYMBALTA) 30 MG capsule Take 90 mg by mouth daily.    Marland Kitchen enoxaparin (LOVENOX) 40 MG/0.4ML injection Inject 0.4 mLs (40 mg total) into the skin daily for 21 days. (Patient not taking: Reported on 10/19/2018)    . folic acid (FOLVITE) 1 MG tablet Take 1 tablet (1 mg total) by mouth daily. 90 tablet 3  . gabapentin (NEURONTIN) 300 MG capsule Take 300 mg by mouth 4 (four) times daily.     . hyoscyamine (ANASPAZ) 0.125 MG TBDP disintergrating tablet Place 0.125 mg under the tongue every 6 (six) hours as needed.    Marland Kitchen morphine (MS CONTIN) 30 MG 12 hr tablet Take 1 tablet (30 mg total) by mouth every 8 (eight) hours. 20 tablet 0  . morphine (MSIR) 15 MG tablet Take 1 tablet (15 mg total) by mouth every 4 (four) hours. 30 tablet 0  . pantoprazole (PROTONIX) 40 MG tablet TAKE 1 TABLET 30 MINUTES twice per day (Patient not taking: Reported on 10/19/2018) 180 tablet 3  . polyethylene glycol (MIRALAX / GLYCOLAX) packet Take 17 g by mouth daily as needed for mild  constipation. 14 each 0  . senna (SENOKOT) 8.6 MG TABS tablet Take 1 tablet by mouth 2 (two) times daily as needed for mild constipation.     No current facility-administered medications for this visit.       Marland Kitchen  PHYSICAL EXAMINATION:  Vitals:   10/26/18 1133  BP: 128/79  Pulse: 76  Resp: 16  Temp: 97.8 F (36.6 C)   There were no vitals filed for this visit.  Physical Exam  Constitutional: She is oriented to person, place, and time and well-developed, well-nourished, and in no distress.  She is in a wheelchair.  Secondary to recent right hip surgery.  Alone.  HENT:  Head: Normocephalic and atraumatic.  Mouth/Throat: Oropharynx is clear and moist. No oropharyngeal exudate.  Eyes: Pupils are equal, round, and reactive to light.  Neck: Normal range of motion. Neck supple.  Cardiovascular: Normal rate and regular rhythm.  Pulmonary/Chest: No respiratory distress. She has no wheezes.  Abdominal: Soft. Bowel sounds are normal. She exhibits no distension and no mass. There is no tenderness. There is no rebound and no guarding.  Musculoskeletal: Normal range of motion. She exhibits no edema or tenderness.  Neurological: She is alert and oriented to person, place, and time.  Skin: Skin is warm.  Psychiatric: Affect normal.     LABORATORY DATA:  I have reviewed the data as listed Lab Results  Component Value Date   WBC 7.0 10/26/2018   HGB 14.6 10/26/2018   HCT 44.6 10/26/2018   MCV 101.1 (H) 10/26/2018   PLT 261 10/26/2018   Recent Labs    12/22/17 1650  10/01/18 0253  10/02/18 0346 10/04/18 0546 10/19/18 1612  NA 136   < > 131*  --  134*  --  136  K 3.9   < > 2.8*   < > 3.5 3.7 4.2  CL 99   < >  84*  --  98  --  97*  CO2 30   < > 30  --  29  --  31  GLUCOSE 92   < > 89  --  128*  --  90  BUN 6   < > 8  --  10  --  11  CREATININE 0.71   < > 0.58  --  0.70  --  0.62  CALCIUM 8.8   < > 9.0  --  7.1*  --  8.7*  GFRNONAA  --    < > >60  --  >60  --  >60  GFRAA  --     < > >60  --  >60  --  >60  PROT 7.1  --   --   --   --   --   --   ALBUMIN 4.1  --   --   --   --   --   --   AST 21  --   --   --   --   --   --   ALT 10  --   --   --   --   --   --   ALKPHOS 82  --   --   --   --   --   --   BILITOT 0.6  --   --   --   --   --   --   BILIDIR 0.1  --   --   --   --   --   --    < > = values in this interval not displayed.    RADIOGRAPHIC STUDIES: I have personally reviewed the radiological images as listed and agreed with the findings in the report. Dg Chest 1 View  Result Date: 10/01/2018 CLINICAL DATA:  67 y/o F; right hip pain after fall. History of hypertension and pneumonia. EXAM: CHEST  1 VIEW COMPARISON:  05/15/2018 chest radiograph. FINDINGS: Normal cardiac silhouette. Aortic atherosclerosis with calcification. Pulmonary venous hypertension. No focal consolidation. No pleural effusion or pneumothorax. No acute osseous abnormality is evident. IMPRESSION: Pulmonary venous hypertension. No focal consolidation. Aortic Atherosclerosis (ICD10-I70.0). Electronically Signed   By: Kristine Garbe M.D.   On: 10/01/2018 03:01   US Venous Img Lower Unilateral Right  Result Date: 10/19/2018 CLINICAL DATA:  67 year old with right leg swelling. Evaluate for DVT. Recent right femur fracture. EXAM: RIGHT LOWER EXTREMITY VENOUS DOPPLER ULTRASOUND TECHNIQUE: Gray-scale sonography with graded compression, as well as color Doppler and duplex ultrasound were performed to evaluate the lower extremity deep venous systems from the level of the common femoral vein and including the common femoral, femoral, profunda femoral, popliteal and calf veins including the posterior tibial, peroneal and gastrocnemius veins when visible. The superficial great saphenous vein was also interrogated. Spectral Doppler was utilized to evaluate flow at rest and with distal augmentation maneuvers in the common femoral, femoral and popliteal veins. COMPARISON:  None. FINDINGS:  Contralateral Common Femoral Vein: Respiratory phasicity is normal and symmetric with the symptomatic side. No evidence of thrombus. Normal compressibility. Common Femoral Vein: No evidence of thrombus. Normal compressibility, respiratory phasicity and response to augmentation. Saphenofemoral Junction: No evidence of thrombus. Normal compressibility and flow on color Doppler imaging. Profunda Femoral Vein: No evidence of thrombus. Normal compressibility and flow on color Doppler imaging. Femoral Vein: No evidence of thrombus. Normal compressibility, respiratory phasicity and response to augmentation. Popliteal Vein: No evidence of thrombus. Normal compressibility, respiratory phasicity  and response to augmentation. Calf Veins: No evidence of thrombus. Normal compressibility and flow on color Doppler imaging. Venous Reflux:  None. Other Findings:  None. IMPRESSION: Negative for deep venous thrombosis in right lower extremity. Electronically Signed   By: Markus Daft M.D.   On: 10/19/2018 17:19   Dg Hip Port Unilat With Pelvis 1v Right  Result Date: 10/01/2018 CLINICAL DATA:  Right hip ORIF EXAM: DG HIP (WITH OR WITHOUT PELVIS) 1V PORT RIGHT COMPARISON:  10/01/2018 FINDINGS: Internal fixation across the right femoral intertrochanteric fracture. Anatomic alignment. No hardware complicating feature. IMPRESSION: Internal fixation across the right femoral intertrochanteric fracture with anatomic alignment and no visible complicating feature. Electronically Signed   By: Rolm Baptise M.D.   On: 10/01/2018 15:44   Dg Hip Operative Unilat W Or W/o Pelvis Right  Result Date: 10/01/2018 CLINICAL DATA:  IM nailing RIGHT hip EXAM: OPERATIVE RIGHT HIP (WITH PELVIS IF PERFORMED) 4 VIEWS TECHNIQUE: Fluoroscopic spot image(s) were submitted for interpretation post-operatively. COMPARISON:  10/01/2018 RIGHT hip radiographs 1 minutes 8 seconds Images obtained: 4 Dose: 9.09 mGy FINDINGS: IM nail with compression screw placed  across reduced intertrochanteric fracture of RIGHT femur. Bones demineralized. No new fracture, dislocation or bone destruction. Scattered atherosclerotic calcifications. IMPRESSION: Post ORIF of a reduced intertrochanteric fracture of the RIGHT femur. Electronically Signed   By: Lavonia Dana M.D.   On: 10/01/2018 15:08   Dg Hip Unilat  With Pelvis 2-3 Views Right  Result Date: 10/01/2018 CLINICAL DATA:  67 y/o  F; right hip pain after fall. EXAM: DG HIP (WITH OR WITHOUT PELVIS) 2-3V RIGHT COMPARISON:  None. FINDINGS: Acute intertrochanteric mildly displaced and angulated fracture of the right proximal femur. No hip joint dislocation. No pelvic fracture or diastasis identified. Left proximal femur and hip joint is well maintained. Partially visualized lower lumbar spine fusion hardware. Vascular calcifications noted. IMPRESSION: Acute intertrochanteric mildly displaced and angulated fracture of the right proximal femur. Electronically Signed   By: Kristine Garbe M.D.   On: 10/01/2018 02:58    ASSESSMENT & PLAN:   Erythrocytosis # Erythrocytosis-isolated hematocrit/hemoglobin-elevated for the last 9 years.  Suspect secondary causes; rather than primary.  #I had a long discussion with patient regarding multiple causes of elevated hemoglobin-primary bone marrow process versus secondary causes.  Discussed that phlebotomy is usually recommended for polycythemia vera to avoid risk of stroke/thromboembolic events. Role phlebotomy in secondary polycythemia is controversial.  Phlebotomy could be offered for symptom control, however the goal should be to identify the underlying cause/and treat the cause.   #Check CBC CMP LDH, Jak-2 mutation; erythropoietin levels.   # Macrocytosis- MCV 103; Normal Hb- Hx of b12 def- reocmmend checking b12/MMA.  Recommend sublingual B12.  # Smoker-currently quit/ discussed re: GQQP; patient is interested.  Will refer to the lung cancer screening program nurse  navigator's.  Thank you for allowing me to participate in the care of your pleasant patient. Please do not hesitate to contact me with questions or concerns in the interim.   # DISPOSITION:  # Blood work today # Follow up based on above blood work- Dr.B  Cc; [facility]ley/Perkins     All questions were answered. The patient knows to call the clinic with any problems, questions or concerns.    Cammie Sickle, MD 10/26/2018 1:01 PM

## 2018-10-26 NOTE — Assessment & Plan Note (Addendum)
#   Erythrocytosis-isolated hematocrit/hemoglobin-elevated for the last 9 years.  Suspect secondary causes; rather than primary.  #I had a long discussion with patient regarding multiple causes of elevated hemoglobin-primary bone marrow process versus secondary causes.  Discussed that phlebotomy is usually recommended for polycythemia vera to avoid risk of stroke/thromboembolic events. Role phlebotomy in secondary polycythemia is controversial.  Phlebotomy could be offered for symptom control, however the goal should be to identify the underlying cause/and treat the cause.   #Check CBC CMP LDH, Jak-2 mutation; erythropoietin levels.   # Macrocytosis- MCV 103; Normal Hb- Hx of b12 def- reocmmend checking b12/MMA.  Recommend sublingual B12.  # Smoker-currently quit/ discussed re: [facility]; patient is interested.  Will refer to the lung cancer screening program nurse navigator's.  Thank you for allowing me to participate in the care of your pleasant patient. Please do not hesitate to contact me with questions or concerns in the interim.   # DISPOSITION:  # Blood work today # Follow up based on above blood work- Dr.B  Cc; Hayley/Perkins

## 2018-10-27 LAB — FOLATE: Folate: 68 ng/mL (ref >5.9)

## 2018-10-27 LAB — ERYTHROPOIETIN: Erythropoietin: 5.3 m[IU]/mL (ref 2.6–18.5)

## 2018-10-28 DIAGNOSIS — G8929 Other chronic pain: Secondary | ICD-10-CM | POA: Diagnosis not present

## 2018-10-28 DIAGNOSIS — S72001D Fracture of unspecified part of neck of right femur, subsequent encounter for closed fracture with routine healing: Secondary | ICD-10-CM | POA: Diagnosis not present

## 2018-10-28 DIAGNOSIS — Z8719 Personal history of other diseases of the digestive system: Secondary | ICD-10-CM | POA: Diagnosis not present

## 2018-10-28 DIAGNOSIS — R69 Illness, unspecified: Secondary | ICD-10-CM | POA: Diagnosis not present

## 2018-10-28 DIAGNOSIS — K219 Gastro-esophageal reflux disease without esophagitis: Secondary | ICD-10-CM | POA: Diagnosis not present

## 2018-10-29 LAB — METHYLMALONIC ACID, SERUM: Methylmalonic Acid, Quantitative: 983 nmol/L — ABNORMAL HIGH (ref 0–378)

## 2018-10-31 DIAGNOSIS — R69 Illness, unspecified: Secondary | ICD-10-CM | POA: Diagnosis not present

## 2018-10-31 DIAGNOSIS — G4700 Insomnia, unspecified: Secondary | ICD-10-CM | POA: Diagnosis not present

## 2018-10-31 LAB — JAK2 GENOTYPR

## 2018-11-01 DIAGNOSIS — M6281 Muscle weakness (generalized): Secondary | ICD-10-CM | POA: Diagnosis not present

## 2018-11-01 DIAGNOSIS — Z9181 History of falling: Secondary | ICD-10-CM | POA: Diagnosis not present

## 2018-11-01 DIAGNOSIS — M810 Age-related osteoporosis without current pathological fracture: Secondary | ICD-10-CM | POA: Diagnosis not present

## 2018-11-01 DIAGNOSIS — K219 Gastro-esophageal reflux disease without esophagitis: Secondary | ICD-10-CM | POA: Diagnosis not present

## 2018-11-01 DIAGNOSIS — R69 Illness, unspecified: Secondary | ICD-10-CM | POA: Diagnosis not present

## 2018-11-01 DIAGNOSIS — G894 Chronic pain syndrome: Secondary | ICD-10-CM | POA: Diagnosis not present

## 2018-11-01 DIAGNOSIS — K589 Irritable bowel syndrome without diarrhea: Secondary | ICD-10-CM | POA: Diagnosis not present

## 2018-11-01 DIAGNOSIS — S72141D Displaced intertrochanteric fracture of right femur, subsequent encounter for closed fracture with routine healing: Secondary | ICD-10-CM | POA: Diagnosis not present

## 2018-11-03 ENCOUNTER — Encounter: Payer: Self-pay | Admitting: Internal Medicine

## 2018-11-03 ENCOUNTER — Ambulatory Visit (INDEPENDENT_AMBULATORY_CARE_PROVIDER_SITE_OTHER): Payer: Medicare HMO | Admitting: Internal Medicine

## 2018-11-03 VITALS — BP 102/68 | HR 108 | Temp 97.9°F | Ht 63.0 in | Wt 122.0 lb

## 2018-11-03 DIAGNOSIS — F32A Depression, unspecified: Secondary | ICD-10-CM

## 2018-11-03 DIAGNOSIS — Z0000 Encounter for general adult medical examination without abnormal findings: Secondary | ICD-10-CM | POA: Diagnosis not present

## 2018-11-03 DIAGNOSIS — F411 Generalized anxiety disorder: Secondary | ICD-10-CM

## 2018-11-03 DIAGNOSIS — R69 Illness, unspecified: Secondary | ICD-10-CM | POA: Diagnosis not present

## 2018-11-03 DIAGNOSIS — F329 Major depressive disorder, single episode, unspecified: Secondary | ICD-10-CM

## 2018-11-03 MED ORDER — VITAMIN B-12 100 MCG PO TABS: 100.0000 ug | ORAL_TABLET | Freq: Every day | ORAL | 3 refills | Status: DC

## 2018-11-03 NOTE — Patient Instructions (Addendum)
Your B12 prescription was sent to the pharmacy  Please stop smoking  Please continue all other medications as before, and refills have been done if requested.  Please have the pharmacy call with any other refills you may need.  Please continue your efforts at being more active, low cholesterol diet, and weight control.  You are otherwise up to date with prevention measures today.  Please keep your appointments with your specialists as you may have planned - hematology  You will be contacted regarding the referral for: psychiatry  We can hold on further lab work today  OK to cancel the Dec 3 appt here with me  Please return in 6 months, or sooner if needed

## 2018-11-03 NOTE — Progress Notes (Signed)
Subjective:    Patient ID: Denise King, female    DOB: Jul 17, 1951, 67 y.o.   MRN: [id number]  HPI  Here for wellness and f/u;  Overall doing ok;  Pt denies Chest pain, worsening SOB, DOE, wheezing, orthopnea, PND, worsening LE edema, palpitations, dizziness or syncope.  Pt denies neurological change such as new headache, facial or extremity weakness.  Pt denies polydipsia, polyuria, or low sugar symptoms. Pt states overall good compliance with treatment and medications, good tolerability, and has been trying to follow appropriate diet.  Pt has had mild to mod worsening depressive symptoms, but no suicidal ideation or panic. No fever, night sweats, wt loss, loss of appetite, or other constitutional symptoms.  Pt states good ability with ADL's, has low fall risk, home safety reviewed and adequate, no other significant changes in hearing or vision, and only occasionally active with exercise. S/p femur fx RLE now home from rehab since Nov 4, no further falls.  Plans to get mammogram and pap later, does not want now.  Conts to take b12 sl daily.  Still smoking, not ready to quit Past Medical History:  Diagnosis Date  . Acute cystitis   . Acute respiratory failure ([place])   . Allergy    as a child grew out of them  . Anemia    pernicious anemia  . Anxiety   . Arthritis   . Back pain   . Blood transfusion   . CAP (community acquired pneumonia)   . Chronic pain syndrome   . Depression   . Diverticulosis of colon   . Dysthymia   . Esophagitis   . EtOH dependence ([place])   . Gastritis   . GERD (gastroesophageal reflux disease)   . H/O chest pain Dec. 2013   no work up done  . Hx of colonic polyps   . Hypertension   . Irritable bowel syndrome   . Osteopenia   . Osteoporosis   . Pneumonia 2019  . Polypharmacy   . Reflux esophagitis   . Right sided sciatica 12/22/2017  . Tobacco use disorder   . Unspecified chronic bronchitis ([place])   . Vertigo   . Vitamin B12 deficiency   . Wears  glasses    Past Surgical History:  Procedure Laterality Date  . APPENDECTOMY    . BACK SURGERY  10-09   Dr. Patrice Paradise  . CARPAL TUNNEL RELEASE Right 08/14/2014   Procedure: RIGHT CARPAL TUNNEL RELEASE AND INJECT LEFT THUMB;  Surgeon: Daryll Brod, MD;  Location: [facility];  Service: Orthopedics;  Laterality: Right;  . COLONOSCOPY    . INTRAMEDULLARY (IM) NAIL INTERTROCHANTERIC Right 10/01/2018   Procedure: INTRAMEDULLARY (IM) NAIL INTERTROCHANTRIC;  Surgeon: Thornton Park, MD;  Location: [facility];  Service: Orthopedics;  Laterality: Right;  . LAPAROSCOPY N/A 02/21/2013   Procedure: LAPAROSCOPY OPERATIVE;  Surgeon: Margarette Asal, MD;  Location: [facility];  Service: Gynecology;  Laterality: N/A;  REQUESTING 5MM SCOPE WITH CAMERA  . TONSILLECTOMY    . TUBAL LIGATION    . UPPER GASTROINTESTINAL ENDOSCOPY      reports that she has been smoking cigarettes. She has a 30.00 pack-year smoking history. She has never used smokeless tobacco. She reports that she drinks alcohol. She reports that she does not use drugs. family history includes Colon cancer in her brother and father; Heart disease in her mother; Prostate cancer in her father; Skin cancer in her sister. Allergies  Allergen Reactions  . Atorvastatin Nausea And Vomiting  .  Levofloxacin     Reaction: achilles tendon pain  . Omnicef [Cefdinir] Diarrhea    Uncontrollable diarrhea  . Trazodone And Nefazodone     "Felt like I was going to faint"  . Sulfonamide Derivatives Rash   Current Outpatient Medications on File Prior to Visit  Medication Sig Dispense Refill  . acetaminophen (TYLENOL) 325 MG tablet Take 1-2 tablets (325-650 mg total) by mouth every 6 (six) hours as needed for mild pain (pain score 1-3 or temp > 100.5).    Marland Kitchen aspirin 325 MG EC tablet Take 325 mg by mouth daily.    . clonazePAM (KLONOPIN) 1 MG tablet Take 1 tablet (1 mg total) by mouth at bedtime as needed for anxiety. 20 tablet 0  . cyclobenzaprine  (FLEXERIL) 5 MG tablet Take 5 mg by mouth at bedtime as needed for muscle spasms.    . diphenoxylate-atropine (LOMOTIL) 2.5-0.025 MG tablet Take 1 tablet by mouth 4 (four) times daily as needed for diarrhea or loose stools. 60 tablet 0  . docusate sodium (COLACE) 100 MG capsule Take 1 capsule (100 mg total) by mouth 2 (two) times daily. 10 capsule 0  . DULoxetine (CYMBALTA) 30 MG capsule Take 90 mg by mouth daily.    . folic acid (FOLVITE) 1 MG tablet Take 1 tablet (1 mg total) by mouth daily. 90 tablet 3  . gabapentin (NEURONTIN) 300 MG capsule Take 300 mg by mouth 4 (four) times daily.     . hyoscyamine (ANASPAZ) 0.125 MG TBDP disintergrating tablet Place 0.125 mg under the tongue every 6 (six) hours as needed.    Marland Kitchen morphine (MS CONTIN) 30 MG 12 hr tablet Take 1 tablet (30 mg total) by mouth every 8 (eight) hours. 20 tablet 0  . morphine (MSIR) 15 MG tablet Take 1 tablet (15 mg total) by mouth every 4 (four) hours. 30 tablet 0  . pantoprazole (PROTONIX) 40 MG tablet TAKE 1 TABLET 30 MINUTES twice per day 180 tablet 3  . polyethylene glycol (MIRALAX / GLYCOLAX) packet Take 17 g by mouth daily as needed for mild constipation. 14 each 0  . senna (SENOKOT) 8.6 MG TABS tablet Take 1 tablet by mouth 2 (two) times daily as needed for mild constipation.    . enoxaparin (LOVENOX) 40 MG/0.4ML injection Inject 0.4 mLs (40 mg total) into the skin daily for 21 days. (Patient not taking: Reported on 10/19/2018)     No current facility-administered medications on file prior to visit.    Review of Systems Constitutional: Negative for other unusual diaphoresis, sweats, appetite or weight changes HENT: Negative for other worsening hearing loss, ear pain, facial swelling, mouth sores or neck stiffness.   Eyes: Negative for other worsening pain, redness or other visual disturbance.  Respiratory: Negative for other stridor or swelling Cardiovascular: Negative for other palpitations or other chest pain    Gastrointestinal: Negative for worsening diarrhea or loose stools, blood in stool, distention or other pain Genitourinary: Negative for hematuria, flank pain or other change in urine volume.  Musculoskeletal: Negative for myalgias or other joint swelling.  Skin: Negative for other color change, or other wound or worsening drainage.  Neurological: Negative for other syncope or numbness. Hematological: Negative for other adenopathy or swelling Psychiatric/Behavioral: Negative for hallucinations, other worsening agitation, SI, self-injury, or new decreased concentration All other system neg per pt    Objective:   Physical Exam BP 102/68   Pulse (!) 108   Temp 97.9 F (36.6 C) (Oral)   Ht  5\' 3"  (1.6 m)   Wt 122 lb (55.3 kg)   SpO2 94%   BMI 21.61 kg/m  VS noted,  Constitutional: Pt is oriented to person, place, and time. Appears well-developed and well-nourished, in no significant distress and comfortable Head: Normocephalic and atraumatic  Eyes: Conjunctivae and EOM are normal. Pupils are equal, round, and reactive to light Right Ear: External ear normal without discharge Left Ear: External ear normal without discharge Nose: Nose without discharge or deformity Mouth/Throat: Oropharynx is without other ulcerations and moist  Neck: Normal range of motion. Neck supple. No JVD present. No tracheal deviation present or significant neck LA or mass Cardiovascular: Normal rate, regular rhythm, normal heart sounds and intact distal pulses.   Pulmonary/Chest: WOB normal and breath sounds without rales or wheezing  Abdominal: Soft. Bowel sounds are normal. NT. No HSM  Musculoskeletal: Normal range of motion. Exhibits no edema Lymphadenopathy: Has no other cervical adenopathy.  Neurological: Pt is alert and oriented to person, place, and time. Pt has normal reflexes. No cranial nerve deficit. Motor grossly intact, Gait intact Skin: Skin is warm and dry. No rash noted or new  ulcerations Psychiatric:  Has depressed mood and affect. Behavior is normal without agitation No other exam findings Lab Results  Component Value Date   WBC 7.0 10/26/2018   HGB 14.6 10/26/2018   HCT 44.6 10/26/2018   PLT 261 10/26/2018   GLUCOSE 91 10/26/2018   CHOL 159 12/22/2017   TRIG 103.0 12/22/2017   HDL 60.00 12/22/2017   LDLDIRECT 102.4 05/24/2008   LDLCALC 78 12/22/2017   ALT 11 10/26/2018   AST 23 10/26/2018   NA 136 10/26/2018   K 4.4 10/26/2018   CL 96 (L) 10/26/2018   CREATININE 0.76 10/26/2018   BUN 11 10/26/2018   CO2 31 10/26/2018   TSH 1.108 10/01/2018   INR 0.82 10/19/2018   HGBA1C 5.2 10/01/2018     Declines further lab today    Assessment & Plan:

## 2018-11-04 ENCOUNTER — Telehealth: Payer: Self-pay | Admitting: Internal Medicine

## 2018-11-04 DIAGNOSIS — M6281 Muscle weakness (generalized): Secondary | ICD-10-CM | POA: Diagnosis not present

## 2018-11-04 DIAGNOSIS — S72141D Displaced intertrochanteric fracture of right femur, subsequent encounter for closed fracture with routine healing: Secondary | ICD-10-CM | POA: Diagnosis not present

## 2018-11-04 DIAGNOSIS — K219 Gastro-esophageal reflux disease without esophagitis: Secondary | ICD-10-CM | POA: Diagnosis not present

## 2018-11-04 DIAGNOSIS — R69 Illness, unspecified: Secondary | ICD-10-CM | POA: Diagnosis not present

## 2018-11-04 DIAGNOSIS — Z9181 History of falling: Secondary | ICD-10-CM | POA: Diagnosis not present

## 2018-11-04 DIAGNOSIS — G894 Chronic pain syndrome: Secondary | ICD-10-CM | POA: Diagnosis not present

## 2018-11-04 DIAGNOSIS — S72141A Displaced intertrochanteric fracture of right femur, initial encounter for closed fracture: Secondary | ICD-10-CM | POA: Diagnosis not present

## 2018-11-04 DIAGNOSIS — M810 Age-related osteoporosis without current pathological fracture: Secondary | ICD-10-CM | POA: Diagnosis not present

## 2018-11-04 DIAGNOSIS — K589 Irritable bowel syndrome without diarrhea: Secondary | ICD-10-CM | POA: Diagnosis not present

## 2018-11-04 DIAGNOSIS — J9600 Acute respiratory failure, unspecified whether with hypoxia or hypercapnia: Secondary | ICD-10-CM | POA: Diagnosis not present

## 2018-11-04 NOTE — Telephone Encounter (Signed)
Copied from [place] [id number]. Topic: Quick Communication - Home Health Verbal Orders >> Nov 04, 2018  3:33 PM Sheran Luz wrote: Caller/Agency: [facility] Number: [phone] Requesting OT/PT/Skilled Nursing/Social Work: PT Frequency: 2w6

## 2018-11-05 NOTE — Assessment & Plan Note (Signed)
stable overall by history and exam, recent data reviewed with pt, and pt to continue medical treatment as before,  to f/u any worsening symptoms or concerns  

## 2018-11-05 NOTE — Assessment & Plan Note (Signed)
Chronic persistent despite good med compliance, will need refer psychiatry

## 2018-11-05 NOTE — Assessment & Plan Note (Signed)

## 2018-11-07 NOTE — Telephone Encounter (Signed)
Ok for verbals 

## 2018-11-07 NOTE — Telephone Encounter (Signed)
Notified [name] w/ MD response../lmb ?

## 2018-11-09 DIAGNOSIS — M6281 Muscle weakness (generalized): Secondary | ICD-10-CM | POA: Diagnosis not present

## 2018-11-09 DIAGNOSIS — S72141D Displaced intertrochanteric fracture of right femur, subsequent encounter for closed fracture with routine healing: Secondary | ICD-10-CM | POA: Diagnosis not present

## 2018-11-09 DIAGNOSIS — K219 Gastro-esophageal reflux disease without esophagitis: Secondary | ICD-10-CM | POA: Diagnosis not present

## 2018-11-09 DIAGNOSIS — R69 Illness, unspecified: Secondary | ICD-10-CM | POA: Diagnosis not present

## 2018-11-09 DIAGNOSIS — K589 Irritable bowel syndrome without diarrhea: Secondary | ICD-10-CM | POA: Diagnosis not present

## 2018-11-09 DIAGNOSIS — G894 Chronic pain syndrome: Secondary | ICD-10-CM | POA: Diagnosis not present

## 2018-11-09 DIAGNOSIS — Z9181 History of falling: Secondary | ICD-10-CM | POA: Diagnosis not present

## 2018-11-09 DIAGNOSIS — M810 Age-related osteoporosis without current pathological fracture: Secondary | ICD-10-CM | POA: Diagnosis not present

## 2018-11-11 ENCOUNTER — Telehealth: Payer: Self-pay | Admitting: Internal Medicine

## 2018-11-11 DIAGNOSIS — G894 Chronic pain syndrome: Secondary | ICD-10-CM | POA: Diagnosis not present

## 2018-11-11 DIAGNOSIS — S72141D Displaced intertrochanteric fracture of right femur, subsequent encounter for closed fracture with routine healing: Secondary | ICD-10-CM | POA: Diagnosis not present

## 2018-11-11 DIAGNOSIS — R69 Illness, unspecified: Secondary | ICD-10-CM | POA: Diagnosis not present

## 2018-11-11 DIAGNOSIS — F17210 Nicotine dependence, cigarettes, uncomplicated: Secondary | ICD-10-CM

## 2018-11-11 DIAGNOSIS — K219 Gastro-esophageal reflux disease without esophagitis: Secondary | ICD-10-CM

## 2018-11-11 DIAGNOSIS — M6281 Muscle weakness (generalized): Secondary | ICD-10-CM | POA: Diagnosis not present

## 2018-11-11 DIAGNOSIS — F329 Major depressive disorder, single episode, unspecified: Secondary | ICD-10-CM

## 2018-11-11 DIAGNOSIS — K589 Irritable bowel syndrome without diarrhea: Secondary | ICD-10-CM

## 2018-11-11 DIAGNOSIS — F419 Anxiety disorder, unspecified: Secondary | ICD-10-CM

## 2018-11-11 DIAGNOSIS — M810 Age-related osteoporosis without current pathological fracture: Secondary | ICD-10-CM | POA: Diagnosis not present

## 2018-11-11 DIAGNOSIS — Z9181 History of falling: Secondary | ICD-10-CM

## 2018-11-11 NOTE — Telephone Encounter (Signed)
Copied from [place] [id number]. Topic: Quick Communication - Home Health Verbal Orders >> Nov 11, 2018  3:28 PM Yvette Rack wrote: Caller/Agency: Donalynn Furlong with [facility] Number: [phone] Requesting OT/PT/Skilled Nursing/Social Work: OT evaluation Frequency: evaluation

## 2018-11-14 NOTE — Telephone Encounter (Signed)
Ok for verbals 

## 2018-11-14 NOTE — Telephone Encounter (Signed)
Called Mickel Baas no answer LMOM w/MD response.Marland KitchenJohny Chess

## 2018-11-15 DIAGNOSIS — S72141D Displaced intertrochanteric fracture of right femur, subsequent encounter for closed fracture with routine healing: Secondary | ICD-10-CM | POA: Diagnosis not present

## 2018-11-15 DIAGNOSIS — M810 Age-related osteoporosis without current pathological fracture: Secondary | ICD-10-CM | POA: Diagnosis not present

## 2018-11-15 DIAGNOSIS — K589 Irritable bowel syndrome without diarrhea: Secondary | ICD-10-CM | POA: Diagnosis not present

## 2018-11-15 DIAGNOSIS — K219 Gastro-esophageal reflux disease without esophagitis: Secondary | ICD-10-CM | POA: Diagnosis not present

## 2018-11-15 DIAGNOSIS — R69 Illness, unspecified: Secondary | ICD-10-CM | POA: Diagnosis not present

## 2018-11-15 DIAGNOSIS — M6281 Muscle weakness (generalized): Secondary | ICD-10-CM | POA: Diagnosis not present

## 2018-11-15 DIAGNOSIS — Z9181 History of falling: Secondary | ICD-10-CM | POA: Diagnosis not present

## 2018-11-15 DIAGNOSIS — G894 Chronic pain syndrome: Secondary | ICD-10-CM | POA: Diagnosis not present

## 2018-11-17 DIAGNOSIS — K589 Irritable bowel syndrome without diarrhea: Secondary | ICD-10-CM | POA: Diagnosis not present

## 2018-11-17 DIAGNOSIS — S72141D Displaced intertrochanteric fracture of right femur, subsequent encounter for closed fracture with routine healing: Secondary | ICD-10-CM | POA: Diagnosis not present

## 2018-11-17 DIAGNOSIS — M6281 Muscle weakness (generalized): Secondary | ICD-10-CM | POA: Diagnosis not present

## 2018-11-17 DIAGNOSIS — Z9181 History of falling: Secondary | ICD-10-CM | POA: Diagnosis not present

## 2018-11-17 DIAGNOSIS — M810 Age-related osteoporosis without current pathological fracture: Secondary | ICD-10-CM | POA: Diagnosis not present

## 2018-11-17 DIAGNOSIS — G894 Chronic pain syndrome: Secondary | ICD-10-CM | POA: Diagnosis not present

## 2018-11-17 DIAGNOSIS — R69 Illness, unspecified: Secondary | ICD-10-CM | POA: Diagnosis not present

## 2018-11-17 DIAGNOSIS — K219 Gastro-esophageal reflux disease without esophagitis: Secondary | ICD-10-CM | POA: Diagnosis not present

## 2018-11-18 DIAGNOSIS — M961 Postlaminectomy syndrome, not elsewhere classified: Secondary | ICD-10-CM | POA: Diagnosis not present

## 2018-11-18 DIAGNOSIS — M150 Primary generalized (osteo)arthritis: Secondary | ICD-10-CM | POA: Diagnosis not present

## 2018-11-18 DIAGNOSIS — G894 Chronic pain syndrome: Secondary | ICD-10-CM | POA: Diagnosis not present

## 2018-11-18 DIAGNOSIS — Z79891 Long term (current) use of opiate analgesic: Secondary | ICD-10-CM | POA: Diagnosis not present

## 2018-11-21 IMAGING — CR DG LUMBAR SPINE COMPLETE 4+V
5 series · 5 of 5 positions shown · non-contrast
Comparison: CT lumbar spine July 22, 2016

CLINICAL DATA: Chronic low back pain, worsening for 4 days. Pain
radiates to RIGHT hip.

EXAM:
LUMBAR SPINE - COMPLETE 4+ VIEW

[t lumbar spine ap]
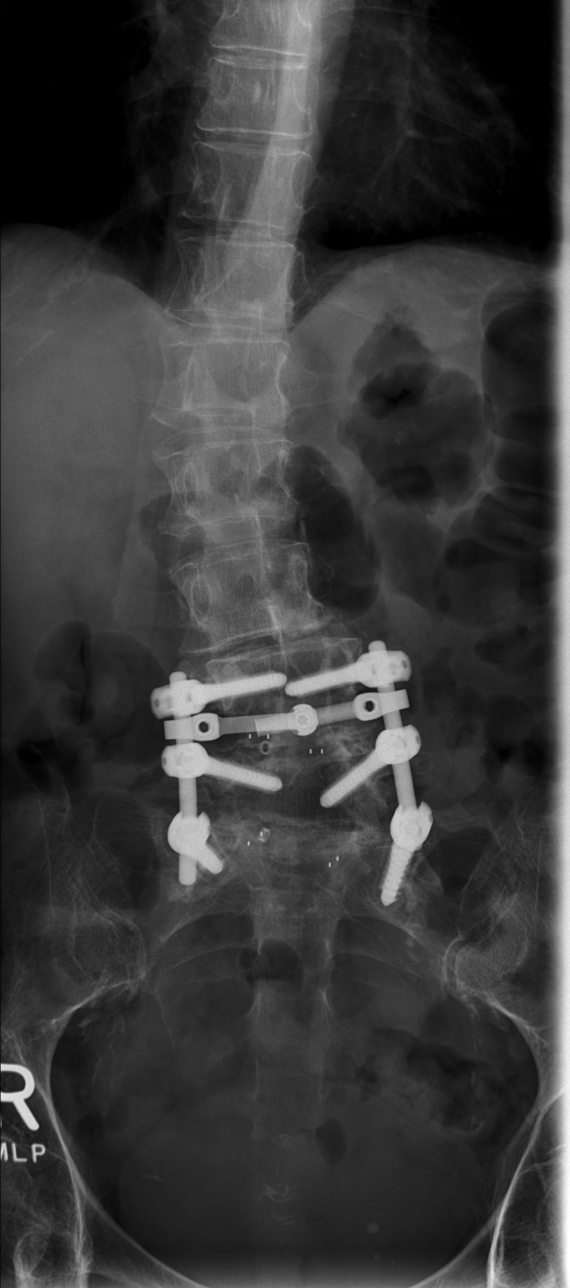

[t lumbar spine obl (1 of 2)]
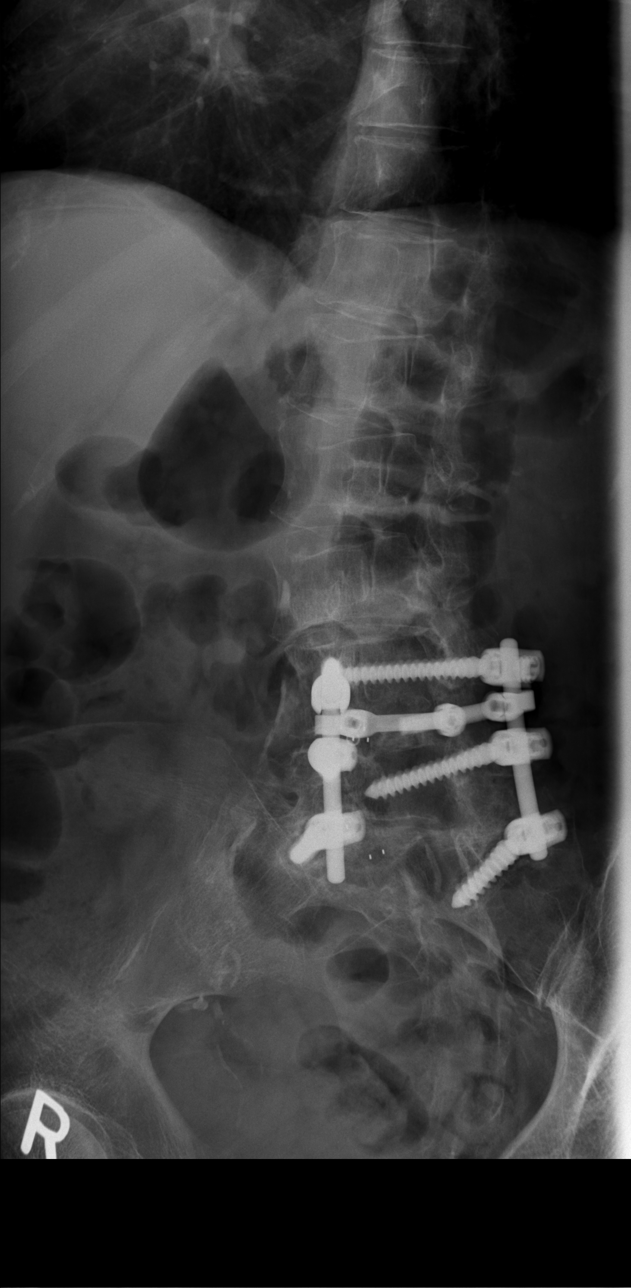

[t lumbar spine obl (2 of 2)]
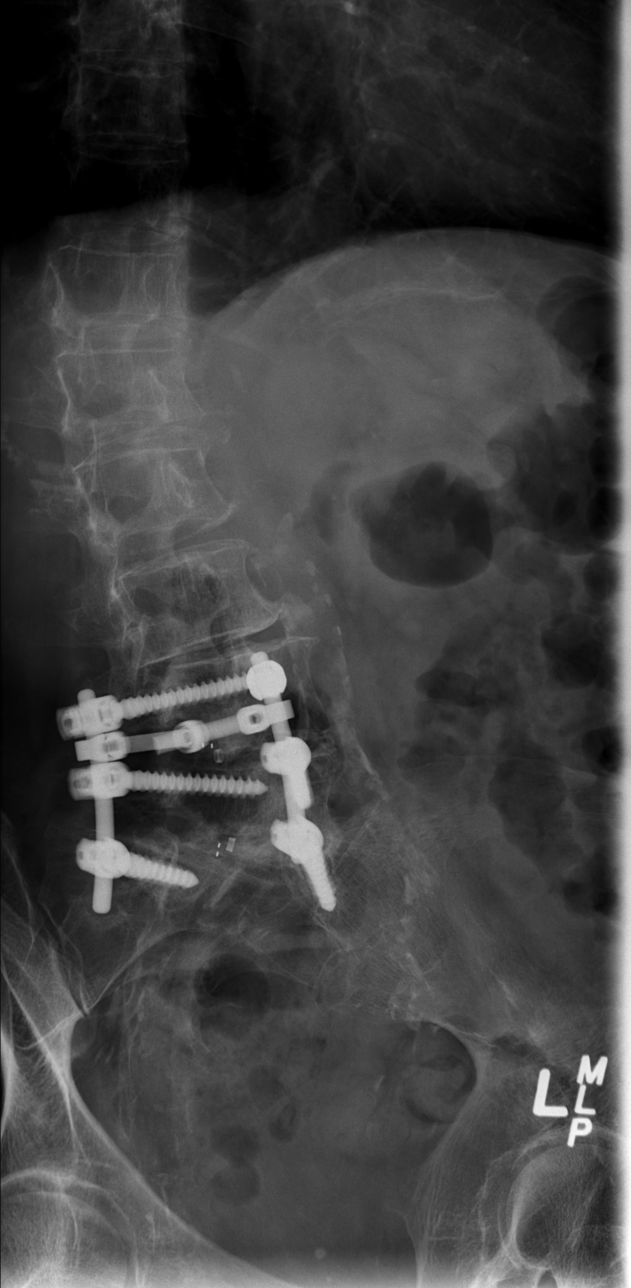

[t lumbar spine lat]
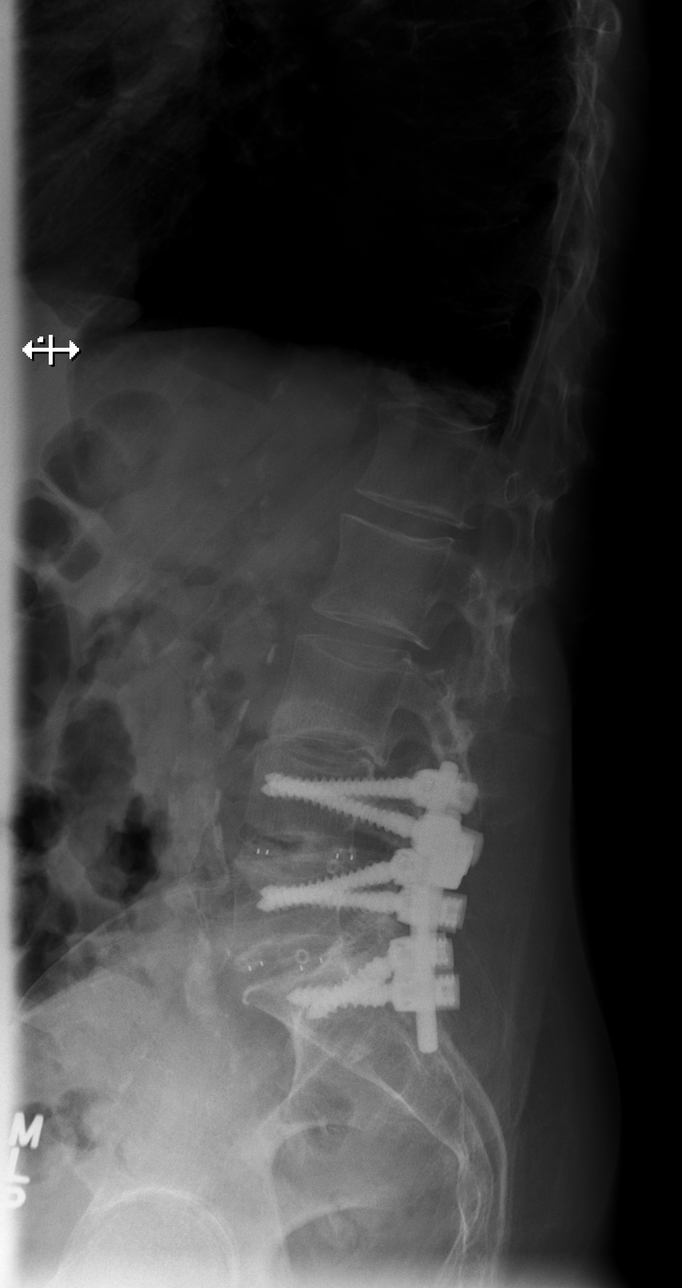

[t lumbar l-5 s-1 spot]
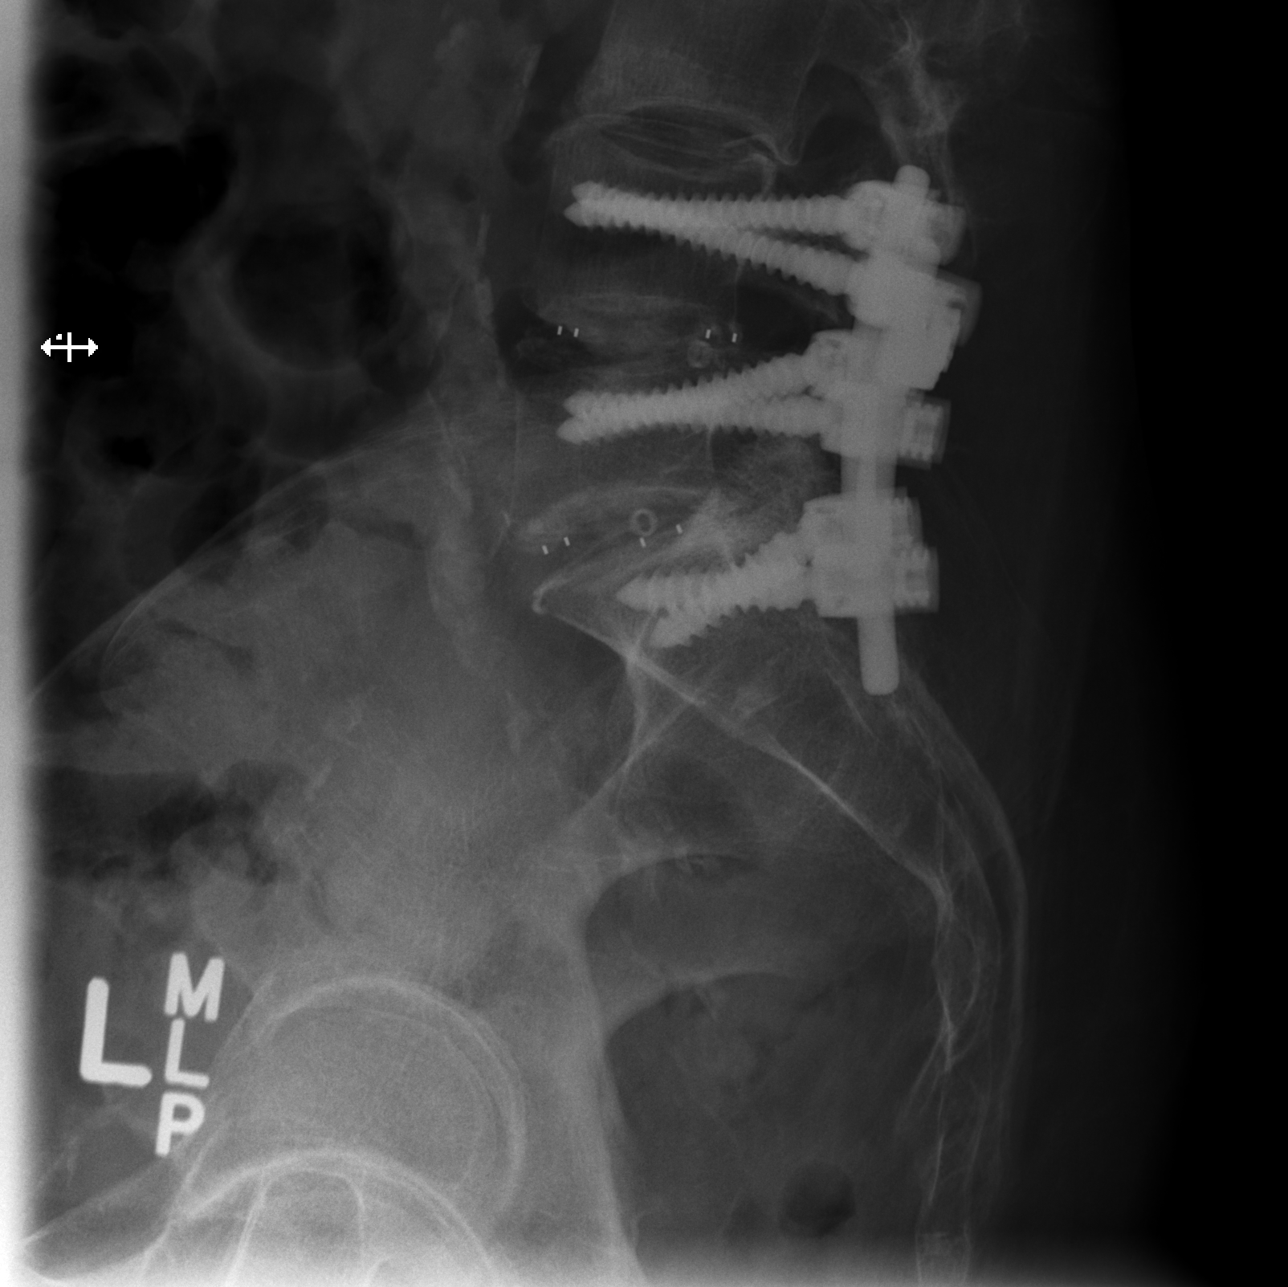

[5 of 5 positions shown; findings below may reference images not displayed]

FINDINGS: Status post L4 through S1 PLIF, similar lucency about the S1 pedicle
screws. L4-5 and L5-S1 laminectomies. Hardware is intact.
Arthrodesis demonstrate on prior CT. New L3 inferior endplate
sclerosis with disc height loss. No fracture deformity. Mild
thoracolumbar dextroscoliosis. Minimal L3-4 retrolisthesis.
Osteopenia. Mild calcific atherosclerosis.
IMPRESSION: No acute fracture deformity. Stable minimal grade 1 L3-4
retrolisthesis.

Progressed L3-4 degenerative discs suggesting adjacent segment
disease, increased sclerosis L3 inferior endplate is likely
degenerative, less likely representing nondisplaced fracture.

Status post L4 through S1 PLIF with stable S1 pedicle screw
loosening.

## 2018-11-22 ENCOUNTER — Telehealth: Payer: Self-pay

## 2018-11-22 DIAGNOSIS — K589 Irritable bowel syndrome without diarrhea: Secondary | ICD-10-CM | POA: Diagnosis not present

## 2018-11-22 DIAGNOSIS — K219 Gastro-esophageal reflux disease without esophagitis: Secondary | ICD-10-CM | POA: Diagnosis not present

## 2018-11-22 DIAGNOSIS — Z9181 History of falling: Secondary | ICD-10-CM | POA: Diagnosis not present

## 2018-11-22 DIAGNOSIS — M810 Age-related osteoporosis without current pathological fracture: Secondary | ICD-10-CM | POA: Diagnosis not present

## 2018-11-22 DIAGNOSIS — R69 Illness, unspecified: Secondary | ICD-10-CM | POA: Diagnosis not present

## 2018-11-22 DIAGNOSIS — M6281 Muscle weakness (generalized): Secondary | ICD-10-CM | POA: Diagnosis not present

## 2018-11-22 DIAGNOSIS — S72141D Displaced intertrochanteric fracture of right femur, subsequent encounter for closed fracture with routine healing: Secondary | ICD-10-CM | POA: Diagnosis not present

## 2018-11-22 DIAGNOSIS — G894 Chronic pain syndrome: Secondary | ICD-10-CM | POA: Diagnosis not present

## 2018-11-22 NOTE — Telephone Encounter (Signed)
Copied from [place]. Topic: General - Other >> Nov 22, 2018 12:05 PM Rayann Heman wrote: Reason for CRM: esther calling with [facility] home health called and stated that pt declined OT evaluation

## 2018-11-29 ENCOUNTER — Ambulatory Visit: Payer: Medicare HMO | Admitting: Internal Medicine

## 2018-11-29 DIAGNOSIS — G894 Chronic pain syndrome: Secondary | ICD-10-CM | POA: Diagnosis not present

## 2018-11-29 DIAGNOSIS — R69 Illness, unspecified: Secondary | ICD-10-CM | POA: Diagnosis not present

## 2018-11-29 DIAGNOSIS — M6281 Muscle weakness (generalized): Secondary | ICD-10-CM | POA: Diagnosis not present

## 2018-11-29 DIAGNOSIS — K219 Gastro-esophageal reflux disease without esophagitis: Secondary | ICD-10-CM | POA: Diagnosis not present

## 2018-11-29 DIAGNOSIS — K589 Irritable bowel syndrome without diarrhea: Secondary | ICD-10-CM | POA: Diagnosis not present

## 2018-11-29 DIAGNOSIS — M810 Age-related osteoporosis without current pathological fracture: Secondary | ICD-10-CM | POA: Diagnosis not present

## 2018-11-29 DIAGNOSIS — S72141D Displaced intertrochanteric fracture of right femur, subsequent encounter for closed fracture with routine healing: Secondary | ICD-10-CM | POA: Diagnosis not present

## 2018-11-29 DIAGNOSIS — Z9181 History of falling: Secondary | ICD-10-CM | POA: Diagnosis not present

## 2018-12-01 ENCOUNTER — Telehealth: Payer: Self-pay | Admitting: Internal Medicine

## 2018-12-01 DIAGNOSIS — K219 Gastro-esophageal reflux disease without esophagitis: Secondary | ICD-10-CM | POA: Diagnosis not present

## 2018-12-01 DIAGNOSIS — K589 Irritable bowel syndrome without diarrhea: Secondary | ICD-10-CM | POA: Diagnosis not present

## 2018-12-01 DIAGNOSIS — M6281 Muscle weakness (generalized): Secondary | ICD-10-CM | POA: Diagnosis not present

## 2018-12-01 DIAGNOSIS — Z9181 History of falling: Secondary | ICD-10-CM | POA: Diagnosis not present

## 2018-12-01 DIAGNOSIS — G894 Chronic pain syndrome: Secondary | ICD-10-CM | POA: Diagnosis not present

## 2018-12-01 DIAGNOSIS — M810 Age-related osteoporosis without current pathological fracture: Secondary | ICD-10-CM | POA: Diagnosis not present

## 2018-12-01 DIAGNOSIS — S72141D Displaced intertrochanteric fracture of right femur, subsequent encounter for closed fracture with routine healing: Secondary | ICD-10-CM | POA: Diagnosis not present

## 2018-12-01 DIAGNOSIS — R69 Illness, unspecified: Secondary | ICD-10-CM | POA: Diagnosis not present

## 2018-12-01 NOTE — Telephone Encounter (Signed)
Verbal orders given  

## 2018-12-01 NOTE — Telephone Encounter (Signed)
Copied from [place] [id number]. Topic: Quick Communication - Home Health Verbal Orders >> Dec 01, 2018  4:05 PM Yvette Rack wrote: Caller/Agency: Donalynn Furlong with [facility] Number: [phone] Requesting OT/PT/Skilled Nursing/Social Work: PT Frequency:  2 times a week for 4 weeks  Mickel Baas stated a message can be left.

## 2018-12-06 DIAGNOSIS — K589 Irritable bowel syndrome without diarrhea: Secondary | ICD-10-CM | POA: Diagnosis not present

## 2018-12-06 DIAGNOSIS — Z9181 History of falling: Secondary | ICD-10-CM | POA: Diagnosis not present

## 2018-12-06 DIAGNOSIS — G894 Chronic pain syndrome: Secondary | ICD-10-CM | POA: Diagnosis not present

## 2018-12-06 DIAGNOSIS — K219 Gastro-esophageal reflux disease without esophagitis: Secondary | ICD-10-CM | POA: Diagnosis not present

## 2018-12-06 DIAGNOSIS — M6281 Muscle weakness (generalized): Secondary | ICD-10-CM | POA: Diagnosis not present

## 2018-12-06 DIAGNOSIS — S72141D Displaced intertrochanteric fracture of right femur, subsequent encounter for closed fracture with routine healing: Secondary | ICD-10-CM | POA: Diagnosis not present

## 2018-12-06 DIAGNOSIS — M810 Age-related osteoporosis without current pathological fracture: Secondary | ICD-10-CM | POA: Diagnosis not present

## 2018-12-06 DIAGNOSIS — R69 Illness, unspecified: Secondary | ICD-10-CM | POA: Diagnosis not present

## 2018-12-08 DIAGNOSIS — K589 Irritable bowel syndrome without diarrhea: Secondary | ICD-10-CM | POA: Diagnosis not present

## 2018-12-08 DIAGNOSIS — Z9181 History of falling: Secondary | ICD-10-CM | POA: Diagnosis not present

## 2018-12-08 DIAGNOSIS — S72001A Fracture of unspecified part of neck of right femur, initial encounter for closed fracture: Secondary | ICD-10-CM | POA: Diagnosis not present

## 2018-12-08 DIAGNOSIS — R69 Illness, unspecified: Secondary | ICD-10-CM | POA: Diagnosis not present

## 2018-12-08 DIAGNOSIS — M6281 Muscle weakness (generalized): Secondary | ICD-10-CM | POA: Diagnosis not present

## 2018-12-08 DIAGNOSIS — M810 Age-related osteoporosis without current pathological fracture: Secondary | ICD-10-CM | POA: Diagnosis not present

## 2018-12-08 DIAGNOSIS — G894 Chronic pain syndrome: Secondary | ICD-10-CM | POA: Diagnosis not present

## 2018-12-08 DIAGNOSIS — S72141D Displaced intertrochanteric fracture of right femur, subsequent encounter for closed fracture with routine healing: Secondary | ICD-10-CM | POA: Diagnosis not present

## 2018-12-08 DIAGNOSIS — K219 Gastro-esophageal reflux disease without esophagitis: Secondary | ICD-10-CM | POA: Diagnosis not present

## 2018-12-13 DIAGNOSIS — K219 Gastro-esophageal reflux disease without esophagitis: Secondary | ICD-10-CM | POA: Diagnosis not present

## 2018-12-13 DIAGNOSIS — G894 Chronic pain syndrome: Secondary | ICD-10-CM | POA: Diagnosis not present

## 2018-12-13 DIAGNOSIS — Z9181 History of falling: Secondary | ICD-10-CM | POA: Diagnosis not present

## 2018-12-13 DIAGNOSIS — S72141D Displaced intertrochanteric fracture of right femur, subsequent encounter for closed fracture with routine healing: Secondary | ICD-10-CM | POA: Diagnosis not present

## 2018-12-13 DIAGNOSIS — R69 Illness, unspecified: Secondary | ICD-10-CM | POA: Diagnosis not present

## 2018-12-13 DIAGNOSIS — M810 Age-related osteoporosis without current pathological fracture: Secondary | ICD-10-CM | POA: Diagnosis not present

## 2018-12-13 DIAGNOSIS — M6281 Muscle weakness (generalized): Secondary | ICD-10-CM | POA: Diagnosis not present

## 2018-12-13 DIAGNOSIS — K589 Irritable bowel syndrome without diarrhea: Secondary | ICD-10-CM | POA: Diagnosis not present

## 2018-12-15 DIAGNOSIS — M150 Primary generalized (osteo)arthritis: Secondary | ICD-10-CM | POA: Diagnosis not present

## 2018-12-15 DIAGNOSIS — Z79891 Long term (current) use of opiate analgesic: Secondary | ICD-10-CM | POA: Diagnosis not present

## 2018-12-15 DIAGNOSIS — G894 Chronic pain syndrome: Secondary | ICD-10-CM | POA: Diagnosis not present

## 2018-12-15 DIAGNOSIS — M961 Postlaminectomy syndrome, not elsewhere classified: Secondary | ICD-10-CM | POA: Diagnosis not present

## 2018-12-15 IMAGING — MR MR LUMBAR SPINE W/O CM
5 series · 42 of 48 positions shown · non-contrast
Comparison: Multiple exams, including 03/02/2017 and 07/22/2016

CLINICAL DATA: Mid back pain for 3 weeks.  Prior fusion in 6006.

EXAM:
MRI LUMBAR SPINE WITHOUT CONTRAST
TECHNIQUE: Multiplanar, multisequence MR imaging of the lumbar spine was
performed. No intravenous contrast was administered.

[Series 3: T2 · sagittal · 4.0mm · 0.94mm/px · 6 of 16 slices shown (1 of 2)]
[im 1/16]
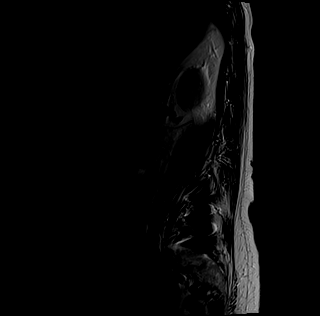
[im 4/16]
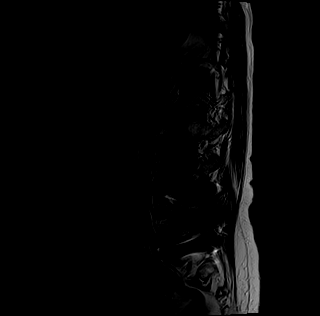
[im 7/16]
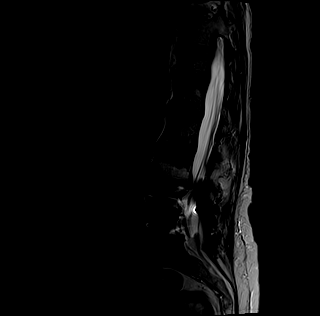
[im 10/16]
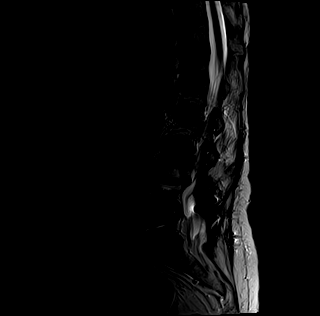
[im 13/16]
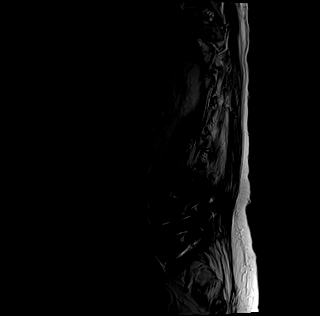
[im 16/16]
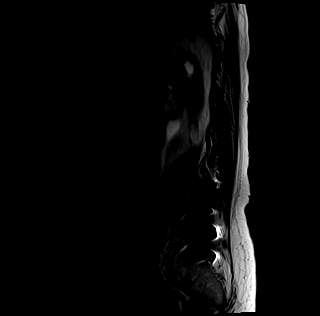

[Series 4: T1 · sagittal · 4.0mm · 0.88mm/px · 7 of 16 slices shown (1 of 2)]
[im 1/16]
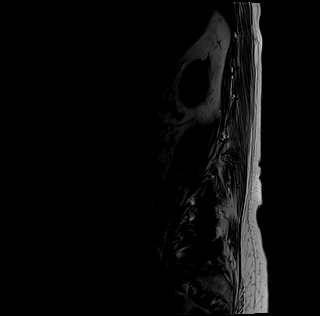
[im 3/16]
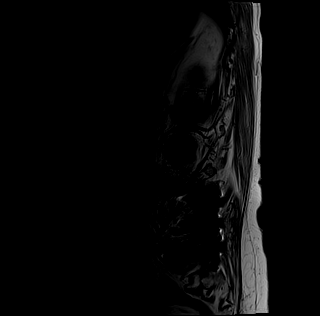
[im 6/16]
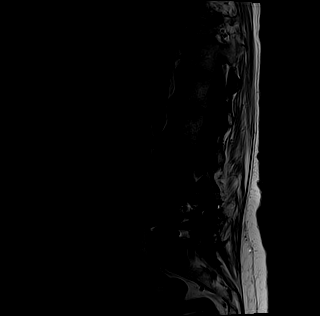
[im 8/16]
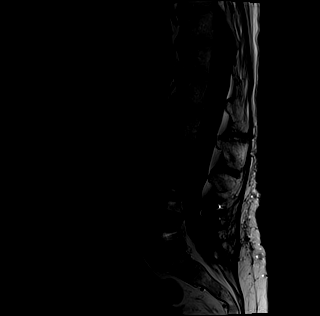
[im 11/16]
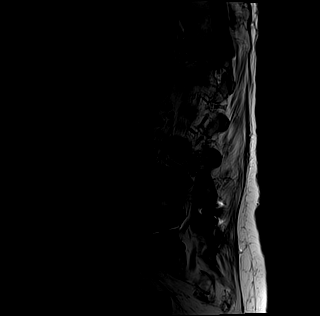
[im 13/16]
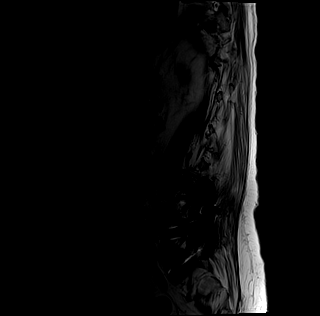
[im 16/16]
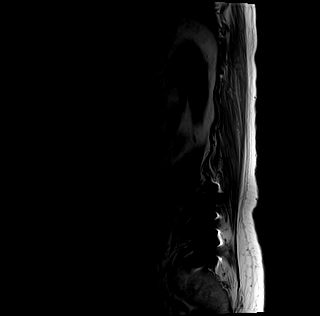

[Series 5: tirm sag · sagittal · 4.0mm · 0.55mm/px · 7 of 16 slices shown]
[im 1/16]
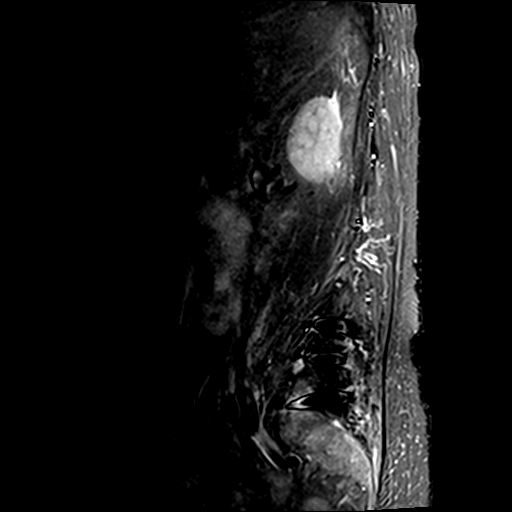
[im 3/16]
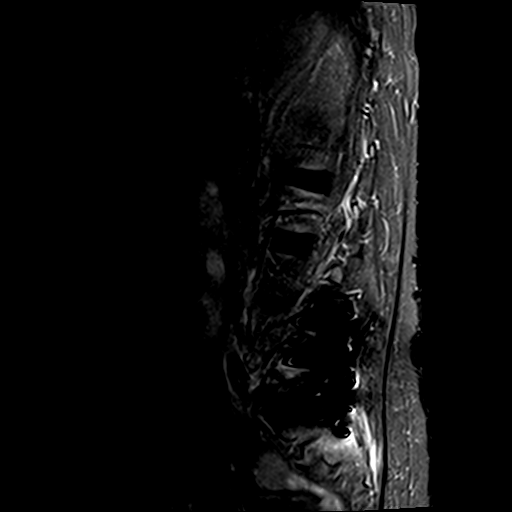
[im 6/16]
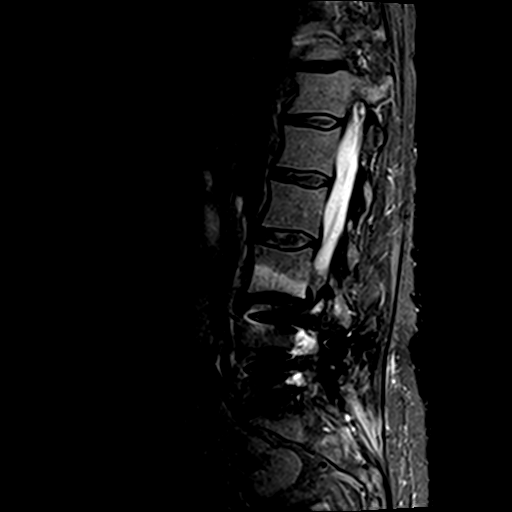
[im 8/16]
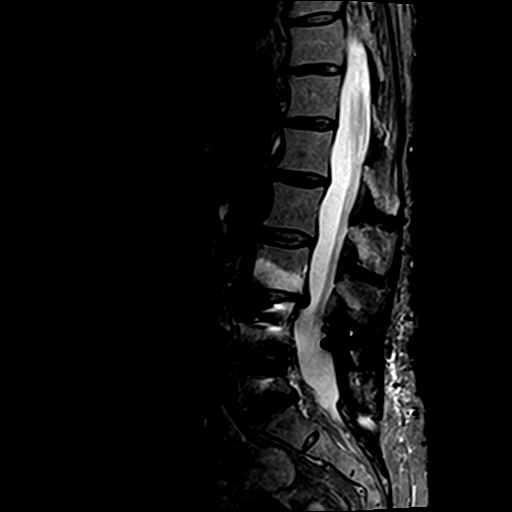
[im 11/16]
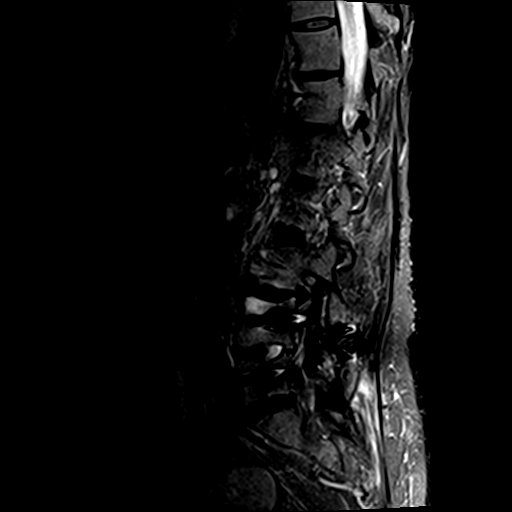
[im 13/16]
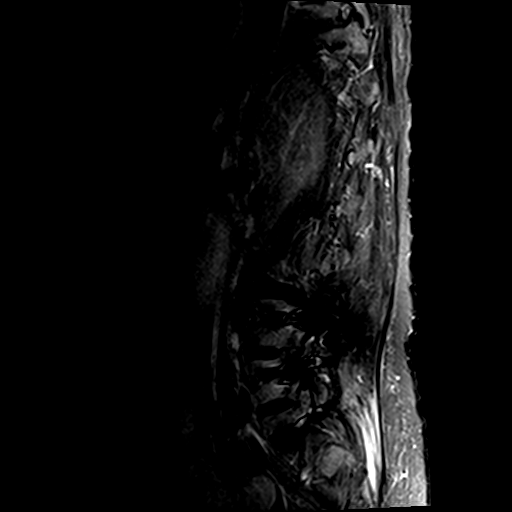
[im 16/16]
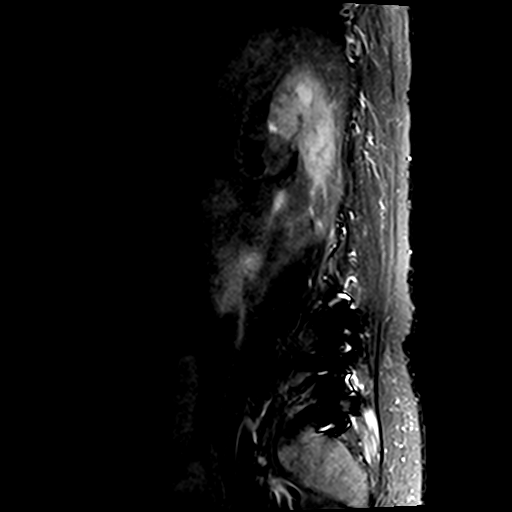

[Series 6: T2 · axial · 4.0mm · 0.70mm/px · z∈[-81,+73]mm · 14 of 31 slices shown (2 of 2)]
[im 1/31]
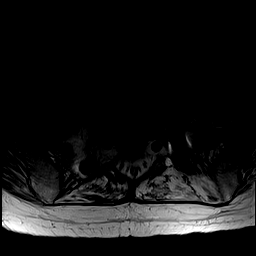
[im 3/31]
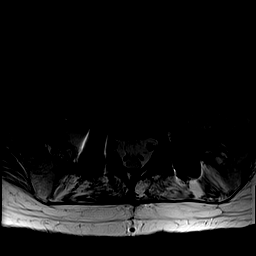
[im 5/31]
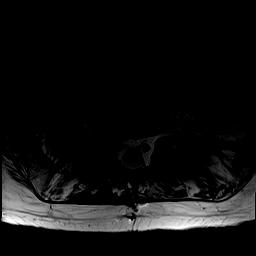
[im 7/31]
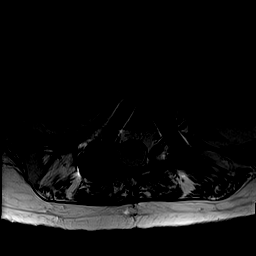
[im 10/31]
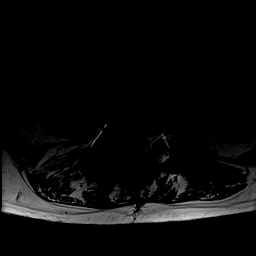
[im 12/31]
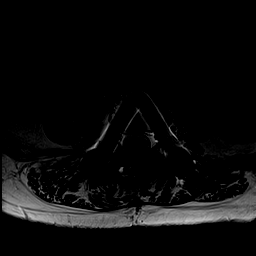
[im 14/31]
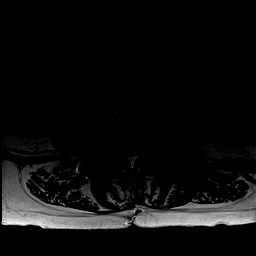
[im 17/31]
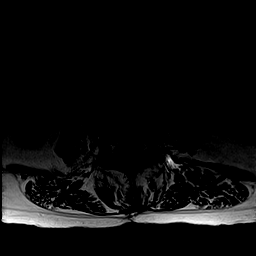
[im 19/31]
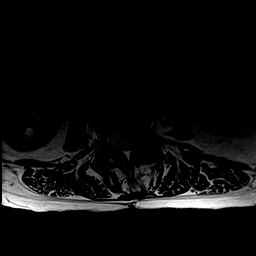
[im 21/31]
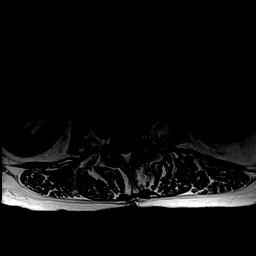
[im 24/31]
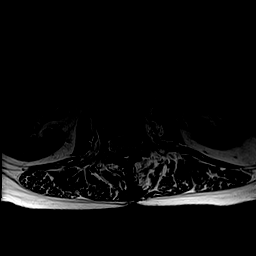
[im 26/31]
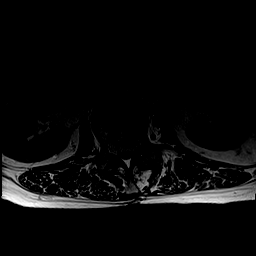
[im 28/31]
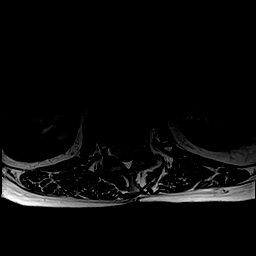
[im 31/31]
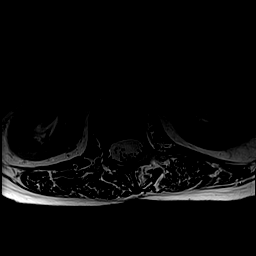

[Series 7: T1 · axial · 4.0mm · 0.94mm/px · z∈[-81,+73]mm · 8 of 31 slices shown (2 of 2)]
[im 1/31]
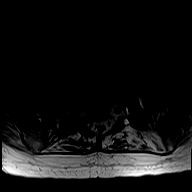
[im 5/31]
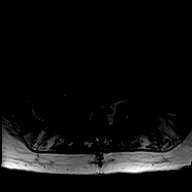
[im 10/31]
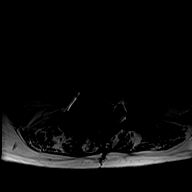
[im 14/31]
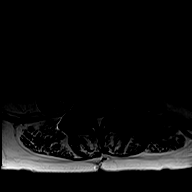
[im 17/31]
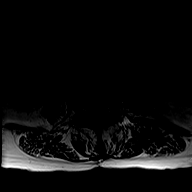
[im 21/31]
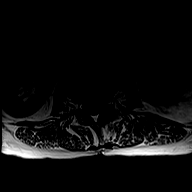
[im 26/31]
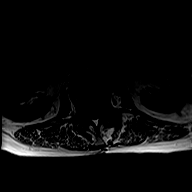
[im 31/31]
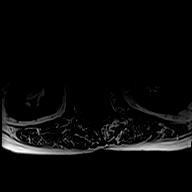

[42 of 48 positions shown; findings below may reference images not displayed]

FINDINGS: Segmentation: The lowest lumbar type non-rib-bearing vertebra is
labeled as L5.

Alignment: Moderate dextroconvex rotary lumbar scoliosis in the
upper lumbar spine centered at L2, with mild levoconvex lower lumbar
scoliosis centered at the L4-5 level. 4 mm degenerative
retrolisthesis at L3-4, not changed from 07/22/2016.

Vertebrae: Type 1 degenerative endplate findings are present at the
L3-4 level. posterolateral rod and pedicle screw fixation at
L4-L5-S1 bilaterally. Solid interbody fusion at the L4-5 and L5-S1
levels. Previously shown on the prior CT was a partially healed
right pedicle fracture at L4 as well as lucency around the pedicle
screws at S1, knee. These findings are able to be readily assessed
on today' s exam due to the metal artifact. There has been prior
posterior decompression from the L3-4 through the S1 levels.

Conus medullaris: Extends to the L1 level and appears normal. No
significant visualized clumping or thickening of nerve roots to
suggest arachnoiditis.

Paraspinal and other soft tissues: Several right-sided T2
hyperintense lesions are present. One of these measuring 8 mm in the
right mid kidney has low T1 signal along its posterior margin and is
probably a small complex cyst. Distal CBD seems to measure up to
about 1.0 cm, prominent for age, on image [DATE].

Disc levels:

L1-2:  No impingement, minimal disc bulge.

L2- 3:  No impingement, minimal disc bulge.

L3-4: Mild to moderate right and mild left foraminal stenosis with
mild bilateral subarticular lateral recess stenosis and mild central
narrowing of the thecal sac due to disc bulge, facet arthropathy,
and left foraminal intervertebral spurring.

L4-5:  No impingement.  Posterior decompression at this level.

L5-S1: Mild right and borderline left foraminal stenosis due to
intervertebral spurring and left facet arthropathy.
IMPRESSION: 1. Lumbar scoliosis, spondylosis, and degenerative disc disease,
causing mild to moderate impingement at L3-4 and mild impingement at
L5-S1, as detailed above.
2. Prior posterior decompression and fusion at L4-L5-S1.
3. Several small right renal cysts are present, including a small
likely Bosniak category 2 cyst of the right mid kidney measuring 7
mm in diameter. Looking back at the prior CT abdomen from 10/04/2016
this also. To measure about 7 mm in diameter, and accordingly the
likelihood of this being a significant lesion is very low, and
further workup of this renal lesion is probably not warranted.

## 2018-12-20 DIAGNOSIS — G894 Chronic pain syndrome: Secondary | ICD-10-CM | POA: Diagnosis not present

## 2018-12-20 DIAGNOSIS — S72141D Displaced intertrochanteric fracture of right femur, subsequent encounter for closed fracture with routine healing: Secondary | ICD-10-CM | POA: Diagnosis not present

## 2018-12-20 DIAGNOSIS — M810 Age-related osteoporosis without current pathological fracture: Secondary | ICD-10-CM | POA: Diagnosis not present

## 2018-12-20 DIAGNOSIS — K589 Irritable bowel syndrome without diarrhea: Secondary | ICD-10-CM | POA: Diagnosis not present

## 2018-12-20 DIAGNOSIS — M6281 Muscle weakness (generalized): Secondary | ICD-10-CM | POA: Diagnosis not present

## 2018-12-20 DIAGNOSIS — K219 Gastro-esophageal reflux disease without esophagitis: Secondary | ICD-10-CM | POA: Diagnosis not present

## 2018-12-20 DIAGNOSIS — R69 Illness, unspecified: Secondary | ICD-10-CM | POA: Diagnosis not present

## 2018-12-20 DIAGNOSIS — Z9181 History of falling: Secondary | ICD-10-CM | POA: Diagnosis not present

## 2019-01-03 DIAGNOSIS — M816 Localized osteoporosis [Lequesne]: Secondary | ICD-10-CM | POA: Diagnosis not present

## 2019-01-03 DIAGNOSIS — M810 Age-related osteoporosis without current pathological fracture: Secondary | ICD-10-CM | POA: Insufficient documentation

## 2019-01-03 DIAGNOSIS — Z6822 Body mass index (BMI) 22.0-22.9, adult: Secondary | ICD-10-CM | POA: Diagnosis not present

## 2019-01-03 DIAGNOSIS — Z124 Encounter for screening for malignant neoplasm of cervix: Secondary | ICD-10-CM | POA: Diagnosis not present

## 2019-01-03 DIAGNOSIS — Z1231 Encounter for screening mammogram for malignant neoplasm of breast: Secondary | ICD-10-CM | POA: Diagnosis not present

## 2019-01-03 DIAGNOSIS — N958 Other specified menopausal and perimenopausal disorders: Secondary | ICD-10-CM | POA: Diagnosis not present

## 2019-01-10 ENCOUNTER — Encounter (HOSPITAL_COMMUNITY): Payer: Self-pay | Admitting: Psychiatry

## 2019-01-10 ENCOUNTER — Ambulatory Visit (HOSPITAL_COMMUNITY): Payer: Self-pay | Admitting: Psychiatry

## 2019-01-10 ENCOUNTER — Ambulatory Visit (INDEPENDENT_AMBULATORY_CARE_PROVIDER_SITE_OTHER): Payer: Medicare HMO | Admitting: Psychiatry

## 2019-01-10 VITALS — BP 118/72 | HR 80 | Ht 62.0 in | Wt 120.0 lb

## 2019-01-10 DIAGNOSIS — F341 Dysthymic disorder: Secondary | ICD-10-CM | POA: Diagnosis not present

## 2019-01-10 DIAGNOSIS — R69 Illness, unspecified: Secondary | ICD-10-CM | POA: Diagnosis not present

## 2019-01-10 DIAGNOSIS — F411 Generalized anxiety disorder: Secondary | ICD-10-CM | POA: Diagnosis not present

## 2019-01-10 MED ORDER — SERTRALINE HCL 100 MG PO TABS: ORAL_TABLET | ORAL | 0 refills | Status: DC

## 2019-01-10 NOTE — Patient Instructions (Signed)
1. Please take only evening dose of Cymbalta (60 mg) for 10 days then stop.  2. At the same time start taking half of 100 mg tablet of Zoloft in the morning (with or after food). Do this for 10 days then start taking a whole 100 mg tablet.  3. Continue clonazepam at bedtime as per previous plan.  Please come back to see me in a month.
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
## 2019-01-12 DIAGNOSIS — M961 Postlaminectomy syndrome, not elsewhere classified: Secondary | ICD-10-CM | POA: Diagnosis not present

## 2019-01-12 DIAGNOSIS — M150 Primary generalized (osteo)arthritis: Secondary | ICD-10-CM | POA: Diagnosis not present

## 2019-01-12 DIAGNOSIS — Z79891 Long term (current) use of opiate analgesic: Secondary | ICD-10-CM | POA: Diagnosis not present

## 2019-01-12 DIAGNOSIS — G894 Chronic pain syndrome: Secondary | ICD-10-CM | POA: Diagnosis not present

## 2019-01-18 ENCOUNTER — Ambulatory Visit (INDEPENDENT_AMBULATORY_CARE_PROVIDER_SITE_OTHER): Payer: Medicare HMO | Admitting: Psychiatry

## 2019-01-18 ENCOUNTER — Encounter (HOSPITAL_COMMUNITY): Payer: Self-pay | Admitting: Psychiatry

## 2019-01-18 DIAGNOSIS — F411 Generalized anxiety disorder: Secondary | ICD-10-CM | POA: Diagnosis not present

## 2019-01-18 DIAGNOSIS — F341 Dysthymic disorder: Secondary | ICD-10-CM | POA: Diagnosis not present

## 2019-01-18 DIAGNOSIS — R69 Illness, unspecified: Secondary | ICD-10-CM | POA: Diagnosis not present
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
## 2019-01-19 DIAGNOSIS — S60012A Contusion of left thumb without damage to nail, initial encounter: Secondary | ICD-10-CM | POA: Diagnosis not present

## 2019-01-19 DIAGNOSIS — M7061 Trochanteric bursitis, right hip: Secondary | ICD-10-CM | POA: Diagnosis not present

## 2019-01-24 DIAGNOSIS — S72001A Fracture of unspecified part of neck of right femur, initial encounter for closed fracture: Secondary | ICD-10-CM | POA: Diagnosis not present

## 2019-01-24 DIAGNOSIS — M545 Low back pain: Secondary | ICD-10-CM | POA: Diagnosis not present

## 2019-01-24 DIAGNOSIS — M25551 Pain in right hip: Secondary | ICD-10-CM | POA: Diagnosis not present

## 2019-01-25 ENCOUNTER — Ambulatory Visit (INDEPENDENT_AMBULATORY_CARE_PROVIDER_SITE_OTHER): Payer: Medicare HMO | Admitting: Psychiatry

## 2019-01-25 DIAGNOSIS — R69 Illness, unspecified: Secondary | ICD-10-CM | POA: Diagnosis not present

## 2019-01-25 DIAGNOSIS — F411 Generalized anxiety disorder: Secondary | ICD-10-CM

## 2019-01-25 DIAGNOSIS — F341 Dysthymic disorder: Secondary | ICD-10-CM | POA: Diagnosis not present

## 2019-01-26 ENCOUNTER — Encounter (HOSPITAL_COMMUNITY): Payer: Self-pay | Admitting: Psychiatry
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
## 2019-01-31 ENCOUNTER — Other Ambulatory Visit: Payer: Self-pay | Admitting: Orthopedic Surgery

## 2019-01-31 DIAGNOSIS — M25551 Pain in right hip: Secondary | ICD-10-CM

## 2019-02-03 ENCOUNTER — Ambulatory Visit
Admission: RE | Admit: 2019-02-03 | Discharge: 2019-02-03 | Disposition: A | Discharge status: Home / Self Care | Payer: Medicare HMO | Source: Ambulatory Visit | Attending: Orthopedic Surgery | Admitting: Orthopedic Surgery

## 2019-02-03 DIAGNOSIS — M25551 Pain in right hip: Secondary | ICD-10-CM | POA: Insufficient documentation

## 2019-02-06 DIAGNOSIS — M961 Postlaminectomy syndrome, not elsewhere classified: Secondary | ICD-10-CM | POA: Diagnosis not present

## 2019-02-06 DIAGNOSIS — Z79891 Long term (current) use of opiate analgesic: Secondary | ICD-10-CM | POA: Diagnosis not present

## 2019-02-06 DIAGNOSIS — M150 Primary generalized (osteo)arthritis: Secondary | ICD-10-CM | POA: Diagnosis not present

## 2019-02-06 DIAGNOSIS — G894 Chronic pain syndrome: Secondary | ICD-10-CM | POA: Diagnosis not present

## 2019-02-07 ENCOUNTER — Encounter (HOSPITAL_COMMUNITY): Payer: Self-pay | Admitting: Psychiatry

## 2019-02-07 ENCOUNTER — Ambulatory Visit (INDEPENDENT_AMBULATORY_CARE_PROVIDER_SITE_OTHER): Payer: Medicare HMO | Admitting: Psychiatry

## 2019-02-07 VITALS — BP 148/81 | HR 98 | Ht 62.0 in | Wt 120.0 lb

## 2019-02-07 DIAGNOSIS — F341 Dysthymic disorder: Secondary | ICD-10-CM | POA: Diagnosis not present

## 2019-02-07 DIAGNOSIS — F411 Generalized anxiety disorder: Secondary | ICD-10-CM

## 2019-02-07 DIAGNOSIS — R69 Illness, unspecified: Secondary | ICD-10-CM | POA: Diagnosis not present

## 2019-02-07 MED ORDER — CLONAZEPAM 1 MG PO TABS: 1.0000 mg | ORAL_TABLET | Freq: Every day | ORAL | 0 refills | Status: DC

## 2019-02-07 MED ORDER — SERTRALINE HCL 100 MG PO TABS: 150.0000 mg | ORAL_TABLET | Freq: Every day | ORAL | 0 refills | Status: DC

## 2019-02-07 NOTE — Progress Notes (Signed)
BH MD/PA/NP OP Progress Note  02/07/2019 3:16 PM Denise King  MRN:  [id number]  Chief Complaint: Anxiety, depression, constant pain HPI: 68 yo married female with chronic pain, depression, generalized anxiety. She has fallen in October, broke her rt leg. Now apparently has a collapsed L3 vertebra (already has L4-5 fused) due to osteoporosis. In constant pain. Furthermore she has problems with the leg she broke and her surgeon does not exclude possibility of needing another operation. We have changed  Cymbalta to Zoloft - she is now on 100 mg and tolerates it well. It is difficult to judge hoe effective it is given constant pain and new health setbacks. Denise King is also on narcotic analgesics, gabapentin. The latter could be increased further as she only takes 1200 mg daily (300 mg qid). Klonopin at night but she only gets 15 tabs per month - difficult to sleep with it and practically impossible without it. Daughter with alcohol/drug abuse (and untreated bipolar disorder) is going to 60 day rehab program (after getting DUI) and Denise King is relieved she may get help she urgently needs.  Visit Diagnosis:    ICD-10-CM   1. Generalized anxiety disorder F41.1   2. DYSTHYMIA F34.1     Past Psychiatric History: See original H&P, no updates required   Past Medical History:  Past Medical History:  Diagnosis Date  . Acute cystitis   . Acute respiratory failure ([place])   . Allergy    as a child grew out of them  . Anemia    pernicious anemia  . Anxiety   . Arthritis   . Back pain   . Blood transfusion   . CAP (community acquired pneumonia)   . Chronic pain syndrome   . Depression   . Diverticulosis of colon   . Dysthymia   . Esophagitis   . EtOH dependence ([place])   . Gastritis   . GERD (gastroesophageal reflux disease)   . H/O chest pain Dec. 2013   no work up done  . Hx of colonic polyps   . Hypertension   . Irritable bowel syndrome   . Osteopenia   . Osteoporosis   . Pneumonia  2019  . Polypharmacy   . Reflux esophagitis   . Right sided sciatica 12/22/2017  . Tobacco use disorder   . Unspecified chronic bronchitis ([place])   . Vertigo   . Vitamin B12 deficiency   . Wears glasses     Past Surgical History:  Procedure Laterality Date  . APPENDECTOMY    . BACK SURGERY  10-09   Dr. Patrice Paradise  . CARPAL TUNNEL RELEASE Right 08/14/2014   Procedure: RIGHT CARPAL TUNNEL RELEASE AND INJECT LEFT THUMB;  Surgeon: Daryll Brod, MD;  Location: [facility];  Service: Orthopedics;  Laterality: Right;  . COLONOSCOPY    . INTRAMEDULLARY (IM) NAIL INTERTROCHANTERIC Right 10/01/2018   Procedure: INTRAMEDULLARY (IM) NAIL INTERTROCHANTRIC;  Surgeon: Thornton Park, MD;  Location: [facility];  Service: Orthopedics;  Laterality: Right;  . LAPAROSCOPY N/A 02/21/2013   Procedure: LAPAROSCOPY OPERATIVE;  Surgeon: Margarette Asal, MD;  Location: [facility];  Service: Gynecology;  Laterality: N/A;  REQUESTING 5MM SCOPE WITH CAMERA  . TONSILLECTOMY    . TUBAL LIGATION    . UPPER GASTROINTESTINAL ENDOSCOPY      Family Psychiatric History: reviewed  Family History:  Family History  Problem Relation Age of Onset  . Colon cancer Father   . Prostate cancer Father   . Heart disease Mother  prev MVR, also has DJD  . Colon cancer Brother   . Skin cancer Sister   . Alcohol abuse Daughter   . Bipolar disorder Daughter   . Drug abuse Daughter   . Esophageal cancer Neg Hx   . Rectal cancer Neg Hx   . Stomach cancer Neg Hx     Social History:  Social History   Socioeconomic History  . Marital status: Married    Spouse name: Not on file  . Number of children: 4  . Years of education: 12  . Highest education level: Not on file  Occupational History  . Occupation: retired    Comment: disablility  Social Needs  . Financial resource strain: Not on file  . Food insecurity:    Worry: Not on file    Inability: Not on file  . Transportation needs:    Medical: Not on file     Non-medical: Not on file  Tobacco Use  . Smoking status: Current Every Day Smoker    Packs/day: 1.00    Years: 30.00    Pack years: 30.00    Types: Cigarettes  . Smokeless tobacco: Never Used  Substance and Sexual Activity  . Alcohol use: Yes    Alcohol/week: 3.0 standard drinks    Types: 3 Standard drinks or equivalent per week    Comment: occasional  . Drug use: No  . Sexual activity: Not on file  Lifestyle  . Physical activity:    Days per week: Not on file    Minutes per session: Not on file  . Stress: Not on file  Relationships  . Social connections:    Talks on phone: Not on file    Gets together: Not on file    Attends religious service: Not on file    Active member of club or organization: Not on file    Attends meetings of clubs or organizations: Not on file    Relationship status: Not on file  Other Topics Concern  . Not on file  Social History Narrative   Married x 38 yrs. Has BS degree in Horticulture from [employer]. Currently resides in a house with her husband. 3 cats.  Fun: used to like to do a lot of things.    Denies religious beliefs that would effect health care.        Currently in rehab; lives with husband [place]; quit smoking- October 6th, 2019. ocasional alcohol. [employer] [2009] on disability sec to back issues.     Allergies:  Allergies  Allergen Reactions  . Atorvastatin Nausea And Vomiting  . Levofloxacin     Reaction: achilles tendon pain  . Omnicef [Cefdinir] Diarrhea    Uncontrollable diarrhea  . Trazodone And Nefazodone     "Felt like I was going to faint"  . Sulfonamide Derivatives Rash    Metabolic Disorder Labs: Lab Results  Component Value Date   HGBA1C 5.2 10/01/2018   MPG 103 10/01/2018   No results found for: PROLACTIN Lab Results  Component Value Date   CHOL 159 12/22/2017   TRIG 103.0 12/22/2017   HDL 60.00 12/22/2017   CHOLHDL 3 12/22/2017   VLDL 20.6 12/22/2017   LDLCALC 78 12/22/2017    LDLCALC 100 (H) 08/15/2013   Lab Results  Component Value Date   TSH 1.108 10/01/2018   TSH 0.718 05/13/2018    Therapeutic Level Labs: No results found for: LITHIUM No results found for: VALPROATE No components found for:  CBMZ  Current Medications: Current  Outpatient Medications  Medication Sig Dispense Refill  . oxyCODONE-acetaminophen (PERCOCET) 10-325 MG tablet Take 1 tablet by mouth every 4 (four) hours as needed for pain.    Marland Kitchen acetaminophen (TYLENOL) 325 MG tablet Take 1-2 tablets (325-650 mg total) by mouth every 6 (six) hours as needed for mild pain (pain score 1-3 or temp > 100.5).    Marland Kitchen aspirin 325 MG EC tablet Take 325 mg by mouth daily.    . clonazePAM (KLONOPIN) 1 MG tablet Take 1 tablet (1 mg total) by mouth at bedtime. 90 tablet 0  . cyclobenzaprine (FLEXERIL) 5 MG tablet Take 5 mg by mouth at bedtime as needed for muscle spasms.    . diphenoxylate-atropine (LOMOTIL) 2.5-0.025 MG tablet Take 1 tablet by mouth 4 (four) times daily as needed for diarrhea or loose stools. 60 tablet 0  . docusate sodium (COLACE) 100 MG capsule Take 1 capsule (100 mg total) by mouth 2 (two) times daily. 10 capsule 0  . enoxaparin (LOVENOX) 40 MG/0.4ML injection Inject 0.4 mLs (40 mg total) into the skin daily for 21 days. (Patient not taking: Reported on 10/19/2018)    . folic acid (FOLVITE) 1 MG tablet Take 1 tablet (1 mg total) by mouth daily. 90 tablet 3  . gabapentin (NEURONTIN) 300 MG capsule Take 300 mg by mouth 4 (four) times daily.     . hyoscyamine (ANASPAZ) 0.125 MG TBDP disintergrating tablet Place 0.125 mg under the tongue every 6 (six) hours as needed.    Marland Kitchen morphine (MS CONTIN) 30 MG 12 hr tablet Take 1 tablet (30 mg total) by mouth every 8 (eight) hours. 20 tablet 0  . pantoprazole (PROTONIX) 40 MG tablet TAKE 1 TABLET 30 MINUTES twice per day (Patient not taking: Reported on 01/10/2019) 180 tablet 3  . polyethylene glycol (MIRALAX / GLYCOLAX) packet Take 17 g by mouth daily as  needed for mild constipation. 14 each 0  . senna (SENOKOT) 8.6 MG TABS tablet Take 1 tablet by mouth 2 (two) times daily as needed for mild constipation.    . sertraline (ZOLOFT) 100 MG tablet Take 1.5 tablets (150 mg total) by mouth daily. 135 tablet 0  . vitamin B-12 (CYANOCOBALAMIN) 100 MCG tablet Take 1 tablet (100 mcg total) by mouth daily. 90 tablet 3   No current facility-administered medications for this visit.      Musculoskeletal: Strength & Muscle Tone: within normal limits Gait & Station: unsteady Patient leans: Front  Psychiatric Specialty Exam: Review of Systems  Constitutional: Negative.   HENT: Negative.   Eyes: Negative.   Respiratory: Negative.   Cardiovascular: Negative.   Gastrointestinal: Negative.   Genitourinary: Negative.   Musculoskeletal: Positive for back pain.  Skin: Negative.   Neurological: Negative.   Endo/Heme/Allergies: Negative.   Psychiatric/Behavioral: Positive for depression. The patient is nervous/anxious and has insomnia.     Blood pressure (!) 148/81, pulse 98, height 5\' 2"  (1.575 m), weight 120 lb (54.4 kg).Body mass index is 21.95 kg/m.  General Appearance: Well Groomed  Eye Contact:  Good  Speech:  Clear and Coherent  Volume:  Normal  Mood:  Anxious and Depressed  Affect:  Congruent and Constricted  Thought Process:  Goal Directed  Orientation:  Full (Time, Place, and Person)  Thought Content: Logical   Suicidal Thoughts:  No  Homicidal Thoughts:  No  Memory:  Immediate;   Good Recent;   Good Remote;   Good  Judgement:  Good  Insight:  Fair  Psychomotor Activity:  Decreased  Concentration:  Concentration: Good  Recall:  Good  Fund of Knowledge: Good  Language: Good  Akathisia:  Negative  Handed:  Right  AIMS (if indicated): not done  Assets:  Communication Skills Desire for Improvement Housing  ADL's:  Intact  Cognition: WNL  Sleep:  Fair   Screenings: PHQ2-9     Office Visit from 10/25/2015 [facility] at  [facility] Total Score  3  PHQ-9 Total Score  18       Assessment and Plan: 68 yo female with chronic anxiety/depression which worsened with increased physical problems/pain. She has been for a long time on Cymbalta - we have cross tapered it to sertraline which she tolerates well. I will increase the dose further and also adjust dosing of clonazepam in such a way that she can take it everty night. She should possibly also have gabapentin dose increased but I will leave it to her MD managing her pain meds. Patient had two psychotherapy sessions so far - enjoys them and finds them helpful.  Dx GAD; Dysthymic disorder; Chronic pain disorder  Plan: Increase sertraline dose to 150 mg daily, change clonazepam 1 mg at HS from prion to scheduled. 90 day Rx for both given.  Patient will make an appointment with Lise Auer for counseling and will see me back in 6 weeks.   Stephanie Acre, MD 02/07/2019, 3:16 PM

## 2019-02-08 DIAGNOSIS — S72001A Fracture of unspecified part of neck of right femur, initial encounter for closed fracture: Secondary | ICD-10-CM | POA: Diagnosis not present

## 2019-02-13 DIAGNOSIS — M25551 Pain in right hip: Secondary | ICD-10-CM | POA: Diagnosis not present

## 2019-02-15 ENCOUNTER — Ambulatory Visit (HOSPITAL_COMMUNITY): Payer: Medicare HMO | Admitting: Psychiatry

## 2019-02-21 ENCOUNTER — Ambulatory Visit (INDEPENDENT_AMBULATORY_CARE_PROVIDER_SITE_OTHER): Payer: Medicare HMO | Admitting: Psychiatry

## 2019-02-21 DIAGNOSIS — R69 Illness, unspecified: Secondary | ICD-10-CM | POA: Diagnosis not present

## 2019-02-21 DIAGNOSIS — F341 Dysthymic disorder: Secondary | ICD-10-CM

## 2019-02-23 ENCOUNTER — Encounter (HOSPITAL_COMMUNITY): Payer: Self-pay | Admitting: Psychiatry
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
## 2019-03-06 ENCOUNTER — Ambulatory Visit (HOSPITAL_COMMUNITY): Payer: Medicare HMO | Admitting: Psychiatry

## 2019-03-06 DIAGNOSIS — M961 Postlaminectomy syndrome, not elsewhere classified: Secondary | ICD-10-CM | POA: Diagnosis not present

## 2019-03-06 DIAGNOSIS — G894 Chronic pain syndrome: Secondary | ICD-10-CM | POA: Diagnosis not present

## 2019-03-06 DIAGNOSIS — M150 Primary generalized (osteo)arthritis: Secondary | ICD-10-CM | POA: Diagnosis not present

## 2019-03-06 DIAGNOSIS — Z79891 Long term (current) use of opiate analgesic: Secondary | ICD-10-CM | POA: Diagnosis not present

## 2019-03-06 DIAGNOSIS — R102 Pelvic and perineal pain: Secondary | ICD-10-CM | POA: Diagnosis not present

## 2019-03-08 DIAGNOSIS — S72001A Fracture of unspecified part of neck of right femur, initial encounter for closed fracture: Secondary | ICD-10-CM | POA: Diagnosis not present

## 2019-03-09 ENCOUNTER — Ambulatory Visit (HOSPITAL_COMMUNITY): Payer: Medicare HMO | Admitting: Psychiatry

## 2019-03-13 DIAGNOSIS — M5416 Radiculopathy, lumbar region: Secondary | ICD-10-CM | POA: Diagnosis not present

## 2019-03-13 DIAGNOSIS — M25551 Pain in right hip: Secondary | ICD-10-CM | POA: Diagnosis not present

## 2019-03-14 DIAGNOSIS — L080 Pyoderma: Secondary | ICD-10-CM | POA: Diagnosis not present

## 2019-03-14 DIAGNOSIS — L0291 Cutaneous abscess, unspecified: Secondary | ICD-10-CM | POA: Diagnosis not present

## 2019-03-20 ENCOUNTER — Other Ambulatory Visit: Payer: Self-pay

## 2019-03-20 ENCOUNTER — Telehealth: Payer: Self-pay | Admitting: Internal Medicine

## 2019-03-20 ENCOUNTER — Encounter (HOSPITAL_COMMUNITY): Payer: Self-pay | Admitting: Psychiatry

## 2019-03-20 ENCOUNTER — Ambulatory Visit (INDEPENDENT_AMBULATORY_CARE_PROVIDER_SITE_OTHER): Payer: Medicare HMO | Admitting: Psychiatry

## 2019-03-20 DIAGNOSIS — F411 Generalized anxiety disorder: Secondary | ICD-10-CM

## 2019-03-20 DIAGNOSIS — R69 Illness, unspecified: Secondary | ICD-10-CM | POA: Diagnosis not present

## 2019-03-20 DIAGNOSIS — F341 Dysthymic disorder: Secondary | ICD-10-CM

## 2019-03-20 NOTE — Telephone Encounter (Signed)
Ordinarily would have her come in but let's set up a telehealth visit for this week please  Tomorrow if she can
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
## 2019-03-20 NOTE — Telephone Encounter (Signed)
Pt scheduled for telephone visit tomorrow at 3:15pm. Pt aware.

## 2019-03-20 NOTE — Telephone Encounter (Signed)
Pt states she has been having diarrhea for the past 3 weeks. Reports she is taking lomotil 4 times a day and it is not working. She has stopped her stool softeners/miralax. Pt started on doxycycline last week for a boil that she had lanced. Please advise.

## 2019-03-21 ENCOUNTER — Other Ambulatory Visit: Payer: Self-pay

## 2019-03-21 ENCOUNTER — Ambulatory Visit (INDEPENDENT_AMBULATORY_CARE_PROVIDER_SITE_OTHER): Payer: Medicare HMO | Admitting: Psychiatry

## 2019-03-21 ENCOUNTER — Encounter: Payer: Self-pay | Admitting: Internal Medicine

## 2019-03-21 ENCOUNTER — Telehealth (INDEPENDENT_AMBULATORY_CARE_PROVIDER_SITE_OTHER): Payer: Medicare HMO | Admitting: Internal Medicine

## 2019-03-21 DIAGNOSIS — K580 Irritable bowel syndrome with diarrhea: Secondary | ICD-10-CM

## 2019-03-21 DIAGNOSIS — F329 Major depressive disorder, single episode, unspecified: Secondary | ICD-10-CM

## 2019-03-21 DIAGNOSIS — F341 Dysthymic disorder: Secondary | ICD-10-CM

## 2019-03-21 DIAGNOSIS — Z79891 Long term (current) use of opiate analgesic: Secondary | ICD-10-CM | POA: Diagnosis not present

## 2019-03-21 DIAGNOSIS — G8929 Other chronic pain: Secondary | ICD-10-CM | POA: Diagnosis not present

## 2019-03-21 DIAGNOSIS — M791 Myalgia, unspecified site: Secondary | ICD-10-CM | POA: Diagnosis not present

## 2019-03-21 DIAGNOSIS — F411 Generalized anxiety disorder: Secondary | ICD-10-CM

## 2019-03-21 DIAGNOSIS — M549 Dorsalgia, unspecified: Secondary | ICD-10-CM

## 2019-03-21 DIAGNOSIS — R69 Illness, unspecified: Secondary | ICD-10-CM | POA: Diagnosis not present

## 2019-03-21 DIAGNOSIS — Z79899 Other long term (current) drug therapy: Secondary | ICD-10-CM

## 2019-03-21 DIAGNOSIS — R197 Diarrhea, unspecified: Secondary | ICD-10-CM | POA: Diagnosis not present

## 2019-03-21 MED ORDER — COLESTIPOL HCL 5 G PO GRAN: 5.0000 g | GRANULES | Freq: Two times a day (BID) | ORAL | 2 refills | Status: DC

## 2019-03-21 MED ORDER — ARIPIPRAZOLE 2 MG PO TABS: 2.0000 mg | ORAL_TABLET | Freq: Every day | ORAL | 0 refills | Status: DC

## 2019-03-21 MED ORDER — SERTRALINE HCL 100 MG PO TABS: 150.0000 mg | ORAL_TABLET | Freq: Every day | ORAL | 0 refills | Status: DC

## 2019-03-21 NOTE — Assessment & Plan Note (Signed)
This sounds like a flare of her [name].  It is unusual but not impossible to have this in the face of her narcotics.  I do not think there are any great risk factors for infection here.  I am going to treat her with colestipol 5 g twice daily with lunch and supper.  If that fails to help she is to contact me.  She is to hold her Lomotil at this point and use the colestipol.  Further plans pending clinical course.

## 2019-03-21 NOTE — Patient Instructions (Signed)
As we discussed I think this is a flare of your irritable bowel syndrome.  I have sent the prescription for colestipol to [facility].  I hope that makes things okay for you.  If it fails to relieve your problems please contact me.  I would give it at least a few days to see if it works.  Hang in there and I hope the stress level gets better.  I appreciate the opportunity to care for this patient. Gatha Mayer, MD, [facility]

## 2019-03-21 NOTE — Progress Notes (Signed)
BH MD/PA/NP OP Progress Note  03/21/2019 2:36 PM Denise King  MRN:  [id number]  Interview was conducted using WebEx teleconferencing application and I verified that I was speaking with the correct person using two identifiers. I discussed the limitations of evaluation and management by telemedicine and  the availability of in person appointments. Patient expressed understanding and agreed to proceed.  Chief Complaint: Depressed mood  HPI: 68 yo female with chronic anxiety/depression which worsened with increased physical problems/pain. She has been for a long time on Cymbalta - we have cross tapered it to sertraline which she tolerated well and dose has been increased to 150 mg as she continued to reports feeling anxious and depressed. Patient had additional two psychotherapy sessions - enjoys them and finds them helpful. She admits to still being depressed and having low energy. She is back on fentanyl patch fo chronic pain. And takes 1 mg of clonazepam at HS only for sleep. Her daughter is still at [facility] and they are encouraging her to go to IP rehab facility from there. [name] hopes that she will go.  Visit Diagnosis:    ICD-10-CM   1. DYSTHYMIA F34.1   2. Generalized anxiety disorder F41.1     Past Psychiatric History: Please see past note's H&P  Past Medical History:  Past Medical History:  Diagnosis Date  . Acute cystitis   . Acute respiratory failure ([place])   . Allergy    as a child grew out of them  . Anemia    pernicious anemia  . Anxiety   . Arthritis   . Back pain   . Blood transfusion   . CAP (community acquired pneumonia)   . Chronic pain syndrome   . Depression   . Diverticulosis of colon   . Dysthymia   . Esophagitis   . EtOH dependence ([place])   . Gastritis   . GERD (gastroesophageal reflux disease)   . H/O chest pain Dec. 2013   no work up done  . Hx of colonic polyps   . Hypertension   . Irritable bowel syndrome   . Osteopenia   .  Osteoporosis   . Pneumonia 2019  . Polypharmacy   . Reflux esophagitis   . Right sided sciatica 12/22/2017  . Tobacco use disorder   . Unspecified chronic bronchitis ([place])   . Vertigo   . Vitamin B12 deficiency   . Wears glasses     Past Surgical History:  Procedure Laterality Date  . APPENDECTOMY    . BACK SURGERY  10-09   Dr. Patrice Paradise  . CARPAL TUNNEL RELEASE Right 08/14/2014   Procedure: RIGHT CARPAL TUNNEL RELEASE AND INJECT LEFT THUMB;  Surgeon: Daryll Brod, MD;  Location: [place];  Service: Orthopedics;  Laterality: Right;  . COLONOSCOPY    . INTRAMEDULLARY (IM) NAIL INTERTROCHANTERIC Right 10/01/2018   Procedure: INTRAMEDULLARY (IM) NAIL INTERTROCHANTRIC;  Surgeon: Thornton Park, MD;  Location: [facility];  Service: Orthopedics;  Laterality: Right;  . LAPAROSCOPY N/A 02/21/2013   Procedure: LAPAROSCOPY OPERATIVE;  Surgeon: Margarette Asal, MD;  Location: [facility];  Service: Gynecology;  Laterality: N/A;  REQUESTING 5MM SCOPE WITH CAMERA  . TONSILLECTOMY    . TUBAL LIGATION    . UPPER GASTROINTESTINAL ENDOSCOPY      Family Psychiatric History: Rveiwed  Family History:  Family History  Problem Relation Age of Onset  . Colon cancer Father   . Prostate cancer Father   . Heart disease Mother  prev MVR, also has DJD  . Colon cancer Brother   . Skin cancer Sister   . Alcohol abuse Daughter   . Bipolar disorder Daughter   . Drug abuse Daughter   . Esophageal cancer Neg Hx   . Rectal cancer Neg Hx   . Stomach cancer Neg Hx     Social History:  Social History   Socioeconomic History  . Marital status: Married    Spouse name: Not on file  . Number of children: 4  . Years of education: 95  . Highest education level: Not on file  Occupational History  . Occupation: retired    Comment: disablility  Social Needs  . Financial resource strain: Not on file  . Food insecurity:    Worry: Not on file    Inability: Not on file  . Transportation  needs:    Medical: Not on file    Non-medical: Not on file  Tobacco Use  . Smoking status: Current Every Day Smoker    Packs/day: 1.00    Years: 30.00    Pack years: 30.00    Types: Cigarettes  . Smokeless tobacco: Never Used  Substance and Sexual Activity  . Alcohol use: Yes    Alcohol/week: 3.0 standard drinks    Types: 3 Standard drinks or equivalent per week    Comment: occasional  . Drug use: No  . Sexual activity: Not on file  Lifestyle  . Physical activity:    Days per week: Not on file    Minutes per session: Not on file  . Stress: Not on file  Relationships  . Social connections:    Talks on phone: Not on file    Gets together: Not on file    Attends religious service: Not on file    Active member of club or organization: Not on file    Attends meetings of clubs or organizations: Not on file    Relationship status: Not on file  Other Topics Concern  . Not on file  Social History Narrative   Married x 38 yrs. Has BS degree in Horticulture from [place]. Currently resides in a house with her husband. 3 cats.  Fun: used to like to do a lot of things.    Denies religious beliefs that would effect health care.        Currently in rehab; lives with husband [place]; quit smoking- October 6th, 2019. ocasional alcohol. [employer] [2009] on disability sec to back issues.     Allergies:  Allergies  Allergen Reactions  . Atorvastatin Nausea And Vomiting  . Levofloxacin     Reaction: achilles tendon pain  . Omnicef [Cefdinir] Diarrhea    Uncontrollable diarrhea  . Trazodone And Nefazodone     "Felt like I was going to faint"  . Sulfonamide Derivatives Rash    Metabolic Disorder Labs: Lab Results  Component Value Date   HGBA1C 5.2 10/01/2018   MPG 103 10/01/2018   No results found for: PROLACTIN Lab Results  Component Value Date   CHOL 159 12/22/2017   TRIG 103.0 12/22/2017   HDL 60.00 12/22/2017   CHOLHDL 3 12/22/2017   VLDL 20.6 12/22/2017    LDLCALC 78 12/22/2017   LDLCALC 100 (H) 08/15/2013   Lab Results  Component Value Date   TSH 1.108 10/01/2018   TSH 0.718 05/13/2018    Therapeutic Level Labs: No results found for: LITHIUM No results found for: VALPROATE No components found for:  CBMZ  Current Medications: Current  Outpatient Medications  Medication Sig Dispense Refill  . acetaminophen (TYLENOL) 325 MG tablet Take 1-2 tablets (325-650 mg total) by mouth every 6 (six) hours as needed for mild pain (pain score 1-3 or temp > 100.5).    Marland Kitchen ARIPiprazole (ABILIFY) 2 MG tablet Take 1 tablet (2 mg total) by mouth daily for 30 days. 30 tablet 0  . aspirin 325 MG EC tablet Take 325 mg by mouth daily.    . clonazePAM (KLONOPIN) 1 MG tablet Take 1 tablet (1 mg total) by mouth at bedtime. 90 tablet 0  . cyclobenzaprine (FLEXERIL) 5 MG tablet Take 5 mg by mouth at bedtime as needed for muscle spasms.    . diphenoxylate-atropine (LOMOTIL) 2.5-0.025 MG tablet Take 1 tablet by mouth 4 (four) times daily as needed for diarrhea or loose stools. 60 tablet 0  . docusate sodium (COLACE) 100 MG capsule Take 1 capsule (100 mg total) by mouth 2 (two) times daily. 10 capsule 0  . enoxaparin (LOVENOX) 40 MG/0.4ML injection Inject 0.4 mLs (40 mg total) into the skin daily for 21 days. (Patient not taking: Reported on 10/19/2018)    . folic acid (FOLVITE) 1 MG tablet Take 1 tablet (1 mg total) by mouth daily. 90 tablet 3  . gabapentin (NEURONTIN) 300 MG capsule Take 300 mg by mouth 4 (four) times daily.     . hyoscyamine (ANASPAZ) 0.125 MG TBDP disintergrating tablet Place 0.125 mg under the tongue every 6 (six) hours as needed.    Marland Kitchen morphine (MS CONTIN) 30 MG 12 hr tablet Take 1 tablet (30 mg total) by mouth every 8 (eight) hours. 20 tablet 0  . oxyCODONE-acetaminophen (PERCOCET) 10-325 MG tablet Take 1 tablet by mouth every 4 (four) hours as needed for pain.    . pantoprazole (PROTONIX) 40 MG tablet TAKE 1 TABLET 30 MINUTES twice per day (Patient  not taking: Reported on 01/10/2019) 180 tablet 3  . polyethylene glycol (MIRALAX / GLYCOLAX) packet Take 17 g by mouth daily as needed for mild constipation. 14 each 0  . senna (SENOKOT) 8.6 MG TABS tablet Take 1 tablet by mouth 2 (two) times daily as needed for mild constipation.    . sertraline (ZOLOFT) 100 MG tablet Take 1.5 tablets (150 mg total) by mouth daily. 135 tablet 0  . vitamin B-12 (CYANOCOBALAMIN) 100 MCG tablet Take 1 tablet (100 mcg total) by mouth daily. 90 tablet 3   No current facility-administered medications for this visit.      Musculoskeletal: Strength & Muscle Tone: within normal limits Gait & Station: normal Patient leans: N/A  Psychiatric Specialty Exam: Review of Systems  Gastrointestinal: Positive for diarrhea.  Genitourinary: Negative.   Musculoskeletal: Positive for back pain and myalgias.  Neurological: Negative.   Endo/Heme/Allergies: Negative.   Psychiatric/Behavioral: Positive for depression.    There were no vitals taken for this visit.There is no height or weight on file to calculate BMI.  General Appearance: Casual  Eye Contact:  Good  Speech:  Clear and Coherent  Volume:  Normal  Mood:  Anxious and Depressed  Affect:  Full Range  Thought Process:  Goal Directed  Orientation:  Full (Time, Place, and Person)  Thought Content: Logical   Suicidal Thoughts:  No  Homicidal Thoughts:  No  Memory:  Immediate;   Good Recent;   Good Remote;   Good  Judgement:  Fair  Insight:  Fair  Psychomotor Activity:  Normal  Concentration:  Concentration: Good  Recall:  Good  Fund of  Knowledge: Good  Language: Good  Akathisia:  Negative  Handed:  Right  AIMS (if indicated): not done  Assets:  Communication Skills Desire for Improvement Housing Social Support  ADL's:  Intact  Cognition: WNL  Sleep:  Fair   Screenings: PHQ2-9     [facility] 10/25/[facility] Total Score  3  PHQ-9 Total Score  18        Assessment and Plan: 68 yo female with chronic anxiety/depression which worsened with increased physical problems/pain. She has been for a long time on Cymbalta - we have cross tapered it to sertraline which she tolerated well and dose has been increased to 150 mg as she continued to reports feeling anxious and depressed. Patient had additional two psychotherapy sessions - enjoys them and finds them helpful. She admits to still being depressed and having low energy. She is back on fentanyl patch fo chronic pain. And takes 1 mg of clonazepam at HS only for sleep. Her daughter is still at [facility] and they are encouraging her to go to IP rehab facility from there. [name] hopes that she will go.  Dx; Dysthymic disorder; Chronic pain disorder  Plan; Continue Zoloft 150 mg and clonazepam 1 mg both at bedtime. Add Abilify 2 mg in AM too augment Zoloft. She reports diarrhea and headaches on and off and I worry that increasing Zoloft dose may make both those symptoms worse. Next appointment in 4 weeks.     Stephanie Acre, MD 03/21/2019, 2:36 PM

## 2019-03-21 NOTE — Progress Notes (Signed)
TELEHEALTH ENCOUNTER IN SETTING OF COVID-19 PANDEMIC SERVICE PROVIDED BY TELEMEDECINE - TYPE: Phone PATIENT LOCATION: Home PATIENT HAS CONSENTED TO TELEHEALTH VISIT PROVIDER LOCATION: OFFICE  PARTICIPANTS OTHER THAN PATIENT:None TIME SPENT ON CALL:15 minutes    Denise King 68 y.o. Sep 15, 1951 [id number]  Assessment & Plan:   Irritable bowel syndrome This sounds like a flare of her [name].  It is unusual but not impossible to have this in the face of her narcotics.  I do not think there are any great risk factors for infection here.  I am going to treat her with colestipol 5 g twice daily with lunch and supper.  If that fails to help she is to contact me.  She is to hold her Lomotil at this point and use the colestipol.  Further plans pending clinical course.     Subjective:   Chief Complaint: Diarrhea as per HPI  HPI Denise King reports a 3-week history of diarrhea.  She had been using some stool softeners and occasional laxatives, she has a history of [name] with diarrhea the last flare was in 2017 at which point a colonoscopy and random biopsies was negative.  She has had to place her daughter in a psychiatric hospital and there is been a terrible amount of stress in the diarrhea coincides with that.  She has had some urge incontinence.  It is mostly postprandial watery and up to 7 or 8 times a day.  She was on some doxycycline for a "boil" recently but the symptoms started before that, she says it is similar to her [name] problems in the past.  A number of years ago she tried Lotronex but it gave her headaches and we had to stop it.  It did stop her diarrhea.  She had Lomotil at home and tried 1 4 times a day but it does not seem to be helping.  She actually is on oxycodone and a fentanyl patch for chronic pain.  There is been no travel or sick contacts.  She is not having abdominal pain.  She is fearful of becoming dehydrated. Allergies  Allergen Reactions  . Atorvastatin Nausea And  Vomiting  . Levofloxacin     Reaction: achilles tendon pain  . Omnicef [Cefdinir] Diarrhea    Uncontrollable diarrhea  . Trazodone And Nefazodone     "Felt like I was going to faint"  . Sulfonamide Derivatives Rash   Current Meds  Medication Sig  . clonazePAM (KLONOPIN) 1 MG tablet Take 1 tablet (1 mg total) by mouth at bedtime.  . diphenoxylate-atropine (LOMOTIL) 2.5-0.025 MG tablet Take 1 tablet by mouth 4 (four) times daily as needed for diarrhea or loose stools.  . fentaNYL (DURAGESIC) 25 MCG/HR Place 1 patch onto the skin every other day.  . hyoscyamine (ANASPAZ) 0.125 MG TBDP disintergrating tablet Place 0.125 mg under the tongue every 6 (six) hours as needed.  . Oxycodone HCl 10 MG TABS Take 10 mg by mouth every 4 (four) hours as needed.  . [DISCONTINUED] oxyCODONE-acetaminophen (PERCOCET) 10-325 MG tablet Take 1 tablet by mouth every 4 (four) hours as needed for pain.   Past Medical History:  Diagnosis Date  . Acute cystitis   . Acute respiratory failure ([place])   . Allergy    as a child grew out of them  . Anemia    pernicious anemia  . Anxiety   . Arthritis   . Back pain   . Blood transfusion   . CAP (community acquired pneumonia)   .  Chronic pain syndrome   . Depression   . Diverticulosis of colon   . Dysthymia   . Esophagitis   . EtOH dependence ([place])   . Gastritis   . GERD (gastroesophageal reflux disease)   . H/O chest pain Dec. 2013   no work up done  . Hx of colonic polyps   . Hypertension   . Irritable bowel syndrome   . Osteopenia   . Osteoporosis   . Pneumonia 2019  . Polypharmacy   . Reflux esophagitis   . Right sided sciatica 12/22/2017  . Tobacco use disorder   . Unspecified chronic bronchitis ([place])   . Vertigo   . Vitamin B12 deficiency   . Wears glasses    Past Surgical History:  Procedure Laterality Date  . APPENDECTOMY    . BACK SURGERY  10-09   Dr. Patrice Paradise  . CARPAL TUNNEL RELEASE Right 08/14/2014   Procedure: RIGHT CARPAL TUNNEL  RELEASE AND INJECT LEFT THUMB;  Surgeon: Daryll Brod, MD;  Location: [place];  Service: Orthopedics;  Laterality: Right;  . COLONOSCOPY    . INTRAMEDULLARY (IM) NAIL INTERTROCHANTERIC Right 10/01/2018   Procedure: INTRAMEDULLARY (IM) NAIL INTERTROCHANTRIC;  Surgeon: Thornton Park, MD;  Location: [facility];  Service: Orthopedics;  Laterality: Right;  . LAPAROSCOPY N/A 02/21/2013   Procedure: LAPAROSCOPY OPERATIVE;  Surgeon: Margarette Asal, MD;  Location: [facility];  Service: Gynecology;  Laterality: N/A;  REQUESTING 5MM SCOPE WITH CAMERA  . TONSILLECTOMY    . TUBAL LIGATION    . UPPER GASTROINTESTINAL ENDOSCOPY     Social History   Social History Narrative   Married x 38 yrs. Has BS degree in Horticulture from [place]. Currently resides in a house with her husband. 3 cats.  Fun: used to like to do a lot of things.    Denies religious beliefs that would effect health care.        Currently in rehab; lives with husband [place]; quit smoking- October 6th, 2019. ocasional alcohol. [employer] [2009] on disability sec to back issues.    family history includes Alcohol abuse in her daughter; Bipolar disorder in her daughter; Colon cancer in her brother and father; Drug abuse in her daughter; Heart disease in her mother; Prostate cancer in her father; Skin cancer in her sister.   Review of Systems As per HPI

## 2019-03-24 ENCOUNTER — Other Ambulatory Visit: Payer: Self-pay | Admitting: Internal Medicine

## 2019-03-24 MED ORDER — CHOLESTYRAMINE 4 GM/DOSE PO POWD: 4.0000 g | Freq: Two times a day (BID) | ORAL | 12 refills | Status: DC

## 2019-04-03 ENCOUNTER — Encounter (HOSPITAL_COMMUNITY): Payer: Self-pay | Admitting: Psychiatry

## 2019-04-03 ENCOUNTER — Other Ambulatory Visit: Payer: Self-pay

## 2019-04-03 ENCOUNTER — Ambulatory Visit (INDEPENDENT_AMBULATORY_CARE_PROVIDER_SITE_OTHER): Payer: Medicare HMO | Admitting: Psychiatry

## 2019-04-03 DIAGNOSIS — R69 Illness, unspecified: Secondary | ICD-10-CM | POA: Diagnosis not present

## 2019-04-03 DIAGNOSIS — F411 Generalized anxiety disorder: Secondary | ICD-10-CM

## 2019-04-03 DIAGNOSIS — F341 Dysthymic disorder: Secondary | ICD-10-CM | POA: Diagnosis not present

## 2019-04-03 NOTE — Progress Notes (Signed)
Virtual Visit via Video Note  I connected with Denise King on 04/03/19 at  2:30 PM EDT by a video enabled telemedicine application and verified that I am speaking with the correct person using two identifiers.   I discussed the limitations of evaluation and management by telemedicine and the availability of in person appointments. The patient expressed understanding and agreed to proceed.  History of Present Illness: Dysthymia and GAD related to chronic marital and familial problems, and recent onset of chronic medical conditions.   Observations/Objective: Counselor met with Denise King via [employer] for individual therapy. Counselor assessed psychiatric symptoms and life stressors. Denise King reported that she is in the bed most days for the whole day other than for meals and to use the rest room. She reports major stress related to her daughter's treatment/constant calls to get her out of treatment. Denise King continues to have depression, anxiety and extreme pain for medical conditions. Counselor processed ways she can advocate for her health, safety and treatment, vs focusing on others needs. Counselor discussed additional coping skills for Denise King to consider. Counselor shared psychoeducation on navigating the [facility] health system. Counselor and [place] spent time identifying healthy and positive aspects of her progress and care. Counselor challenged [place] to take time for herself daily to focus on her individual goals.    Assessment and Plan: Denise King appreciated being able to meet via Webex today and would like to continue doing so.   Follow Up Instructions: Counselor will set next appointment for 2 weeks and Denise King will prioritize using self-care skills between sessions.   I discussed the assessment and treatment plan with the patient. The patient was provided an opportunity to ask questions and all were answered. The patient agreed with the plan and demonstrated an understanding of the  instructions.   The patient was advised to call back or seek an in-person evaluation if the symptoms worsen or if the condition fails to improve as anticipated.  I provided 45 minutes of non-face-to-face time during this encounter.   Lise Auer, LCSW

## 2019-04-04 DIAGNOSIS — M546 Pain in thoracic spine: Secondary | ICD-10-CM | POA: Diagnosis not present

## 2019-04-04 DIAGNOSIS — G894 Chronic pain syndrome: Secondary | ICD-10-CM | POA: Diagnosis not present

## 2019-04-04 DIAGNOSIS — M961 Postlaminectomy syndrome, not elsewhere classified: Secondary | ICD-10-CM | POA: Diagnosis not present

## 2019-04-04 DIAGNOSIS — M150 Primary generalized (osteo)arthritis: Secondary | ICD-10-CM | POA: Diagnosis not present

## 2019-04-04 DIAGNOSIS — M25551 Pain in right hip: Secondary | ICD-10-CM | POA: Diagnosis not present

## 2019-04-04 DIAGNOSIS — Z79891 Long term (current) use of opiate analgesic: Secondary | ICD-10-CM | POA: Diagnosis not present

## 2019-04-04 DIAGNOSIS — R69 Illness, unspecified: Secondary | ICD-10-CM | POA: Diagnosis not present

## 2019-04-07 ENCOUNTER — Emergency Department (HOSPITAL_COMMUNITY): Payer: Medicare HMO

## 2019-04-07 ENCOUNTER — Ambulatory Visit: Payer: Self-pay | Admitting: Internal Medicine

## 2019-04-07 ENCOUNTER — Ambulatory Visit: Payer: Self-pay

## 2019-04-07 ENCOUNTER — Inpatient Hospital Stay (HOSPITAL_COMMUNITY)
Admission: EM | Admit: 2019-04-07 | Discharge: 2019-04-11 | DRG: 642 | Disposition: A | Discharge status: Home / Self Care | Payer: Medicare HMO | Attending: Internal Medicine | Admitting: Internal Medicine

## 2019-04-07 ENCOUNTER — Other Ambulatory Visit: Payer: Self-pay

## 2019-04-07 DIAGNOSIS — R079 Chest pain, unspecified: Secondary | ICD-10-CM

## 2019-04-07 DIAGNOSIS — E869 Volume depletion, unspecified: Secondary | ICD-10-CM | POA: Diagnosis present

## 2019-04-07 DIAGNOSIS — G894 Chronic pain syndrome: Secondary | ICD-10-CM | POA: Diagnosis present

## 2019-04-07 DIAGNOSIS — Z1159 Encounter for screening for other viral diseases: Secondary | ICD-10-CM

## 2019-04-07 DIAGNOSIS — J441 Chronic obstructive pulmonary disease with (acute) exacerbation: Secondary | ICD-10-CM | POA: Diagnosis present

## 2019-04-07 DIAGNOSIS — K30 Functional dyspepsia: Secondary | ICD-10-CM | POA: Diagnosis present

## 2019-04-07 DIAGNOSIS — G8929 Other chronic pain: Secondary | ICD-10-CM | POA: Diagnosis present

## 2019-04-07 DIAGNOSIS — E876 Hypokalemia: Secondary | ICD-10-CM | POA: Diagnosis not present

## 2019-04-07 DIAGNOSIS — F411 Generalized anxiety disorder: Secondary | ICD-10-CM | POA: Diagnosis present

## 2019-04-07 DIAGNOSIS — K589 Irritable bowel syndrome without diarrhea: Secondary | ICD-10-CM | POA: Diagnosis present

## 2019-04-07 DIAGNOSIS — R7989 Other specified abnormal findings of blood chemistry: Secondary | ICD-10-CM

## 2019-04-07 DIAGNOSIS — J9601 Acute respiratory failure with hypoxia: Secondary | ICD-10-CM | POA: Diagnosis not present

## 2019-04-07 DIAGNOSIS — R0602 Shortness of breath: Secondary | ICD-10-CM | POA: Diagnosis present

## 2019-04-07 DIAGNOSIS — D539 Nutritional anemia, unspecified: Secondary | ICD-10-CM | POA: Diagnosis not present

## 2019-04-07 DIAGNOSIS — Z888 Allergy status to other drugs, medicaments and biological substances status: Secondary | ICD-10-CM | POA: Diagnosis not present

## 2019-04-07 DIAGNOSIS — R197 Diarrhea, unspecified: Secondary | ICD-10-CM | POA: Diagnosis not present

## 2019-04-07 DIAGNOSIS — K219 Gastro-esophageal reflux disease without esophagitis: Secondary | ICD-10-CM | POA: Diagnosis present

## 2019-04-07 DIAGNOSIS — F419 Anxiety disorder, unspecified: Secondary | ICD-10-CM | POA: Diagnosis not present

## 2019-04-07 DIAGNOSIS — R0989 Other specified symptoms and signs involving the circulatory and respiratory systems: Secondary | ICD-10-CM | POA: Diagnosis not present

## 2019-04-07 DIAGNOSIS — K580 Irritable bowel syndrome with diarrhea: Secondary | ICD-10-CM | POA: Diagnosis not present

## 2019-04-07 DIAGNOSIS — F17210 Nicotine dependence, cigarettes, uncomplicated: Secondary | ICD-10-CM | POA: Diagnosis present

## 2019-04-07 DIAGNOSIS — R778 Other specified abnormalities of plasma proteins: Secondary | ICD-10-CM

## 2019-04-07 DIAGNOSIS — Z20828 Contact with and (suspected) exposure to other viral communicable diseases: Secondary | ICD-10-CM | POA: Diagnosis present

## 2019-04-07 DIAGNOSIS — I2584 Coronary atherosclerosis due to calcified coronary lesion: Secondary | ICD-10-CM | POA: Diagnosis not present

## 2019-04-07 DIAGNOSIS — Z720 Tobacco use: Secondary | ICD-10-CM

## 2019-04-07 DIAGNOSIS — R0600 Dyspnea, unspecified: Secondary | ICD-10-CM

## 2019-04-07 DIAGNOSIS — Z882 Allergy status to sulfonamides status: Secondary | ICD-10-CM | POA: Diagnosis not present

## 2019-04-07 DIAGNOSIS — R6889 Other general symptoms and signs: Secondary | ICD-10-CM | POA: Diagnosis not present

## 2019-04-07 DIAGNOSIS — Z881 Allergy status to other antibiotic agents status: Secondary | ICD-10-CM | POA: Diagnosis not present

## 2019-04-07 DIAGNOSIS — R69 Illness, unspecified: Secondary | ICD-10-CM | POA: Diagnosis not present

## 2019-04-07 DIAGNOSIS — B349 Viral infection, unspecified: Secondary | ICD-10-CM

## 2019-04-07 DIAGNOSIS — I2510 Atherosclerotic heart disease of native coronary artery without angina pectoris: Secondary | ICD-10-CM | POA: Diagnosis not present

## 2019-04-07 DIAGNOSIS — F329 Major depressive disorder, single episode, unspecified: Secondary | ICD-10-CM | POA: Diagnosis present

## 2019-04-07 DIAGNOSIS — Z79899 Other long term (current) drug therapy: Secondary | ICD-10-CM

## 2019-04-07 DIAGNOSIS — R05 Cough: Secondary | ICD-10-CM | POA: Diagnosis not present

## 2019-04-07 LAB — CBC WITH DIFFERENTIAL/PLATELET
Abs Immature Granulocytes: 0.04 10*3/uL (ref 0.00–0.07)
Basophils Absolute: 0 10*3/uL (ref 0.0–0.1)
Basophils Relative: 0 %
Eosinophils Absolute: 0 10*3/uL (ref 0.0–0.5)
Eosinophils Relative: 0 %
HCT: 42.2 % (ref 36.0–46.0)
Hemoglobin: 13.4 g/dL (ref 12.0–15.0)
Immature Granulocytes: 0 %
Lymphocytes Relative: 31 %
Lymphs Abs: 3.3 10*3/uL (ref 0.7–4.0)
MCH: 32.6 pg (ref 26.0–34.0)
MCHC: 31.8 g/dL (ref 30.0–36.0)
MCV: 102.7 fL — ABNORMAL HIGH (ref 80.0–100.0)
Monocytes Absolute: 1.2 10*3/uL — ABNORMAL HIGH (ref 0.1–1.0)
Monocytes Relative: 11 %
Neutro Abs: 6.1 10*3/uL (ref 1.7–7.7)
Neutrophils Relative %: 58 %
Platelets: 295 10*3/uL (ref 150–400)
RBC: 4.11 MIL/uL (ref 3.87–5.11)
RDW: 13.5 % (ref 11.5–15.5)
WBC: 10.7 10*3/uL — ABNORMAL HIGH (ref 4.0–10.5)
nRBC: 0 % (ref 0.0–0.2)

## 2019-04-07 LAB — URINALYSIS, ROUTINE W REFLEX MICROSCOPIC
Bilirubin Urine: NEGATIVE
Glucose, UA: NEGATIVE mg/dL
Hgb urine dipstick: NEGATIVE
Ketones, ur: NEGATIVE mg/dL
Leukocytes,Ua: NEGATIVE
Nitrite: NEGATIVE
Protein, ur: NEGATIVE mg/dL
Specific Gravity, Urine: 1.03 (ref 1.005–1.030)
pH: 6 (ref 5.0–8.0)

## 2019-04-07 LAB — COMPREHENSIVE METABOLIC PANEL
ALT: 11 U/L (ref 0–44)
AST: 25 U/L (ref 15–41)
Albumin: 3.9 g/dL (ref 3.5–5.0)
Alkaline Phosphatase: 95 U/L (ref 38–126)
Anion gap: 13 (ref 5–15)
BUN: 5 mg/dL — ABNORMAL LOW (ref 8–23)
CO2: 29 mmol/L (ref 22–32)
Calcium: 9.1 mg/dL (ref 8.9–10.3)
Chloride: 93 mmol/L — ABNORMAL LOW (ref 98–111)
Creatinine, Ser: 1 mg/dL (ref 0.44–1.00)
GFR calc Af Amer: >60 mL/min (ref >60)
GFR calc non Af Amer: 59 mL/min — ABNORMAL LOW (ref >60)
Glucose, Bld: 114 mg/dL — ABNORMAL HIGH (ref 70–99)
Potassium: 3.5 mmol/L (ref 3.5–5.1)
Sodium: 135 mmol/L (ref 135–145)
Total Bilirubin: 0.6 mg/dL (ref 0.3–1.2)
Total Protein: 6.7 g/dL (ref 6.5–8.1)

## 2019-04-07 LAB — D-DIMER, QUANTITATIVE: D-Dimer, Quant: 0.84 ug/mL-FEU — ABNORMAL HIGH (ref 0.00–0.50)

## 2019-04-07 LAB — TROPONIN I
Troponin I: 1.07 ng/mL (ref <0.03)
Troponin I: 1.11 ng/mL (ref <0.03)

## 2019-04-07 LAB — LIPASE, BLOOD: Lipase: 25 U/L (ref 11–51)

## 2019-04-07 LAB — LACTIC ACID, PLASMA: Lactic Acid, Venous: 1 mmol/L (ref 0.5–1.9)

## 2019-04-07 IMAGING — CT CT ABD-PELV W/ CM
2 of 5 series · 16 of 46 positions shown, 18 images · IV contrast (iopamidol)
Comparison: 07/03/2016 and 02/24/2013

CLINICAL DATA: Vomiting 4 days ago with 1 episode of diarrhea 3
days ago. Abdominal cramping.

EXAM:
CT ABDOMEN AND PELVIS WITH CONTRAST
TECHNIQUE: Multidetector CT imaging of the abdomen and pelvis was performed
using the standard protocol following bolus administration of
intravenous contrast.
CONTRAST:  < 100 mL > CA1TEX-MOO IOPAMIDOL (CA1TEX-MOO) INJECTION
61%

[Series 2: abd/pel with · axial · 0.74mm/px · z∈[-542,-182]mm · 13 of 84 slices shown, 15 images]
[im 6/84  soft-tissue]
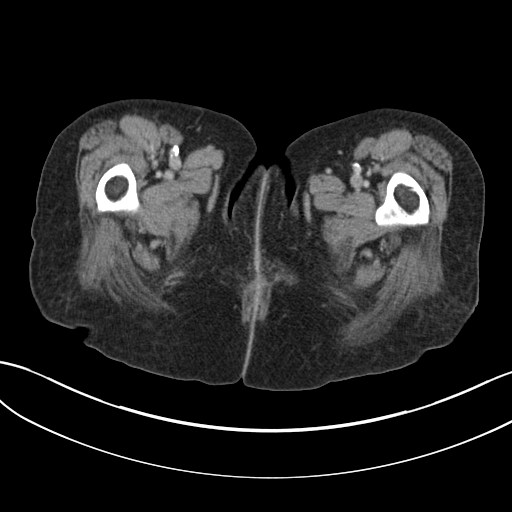
[im 6/84  bone]
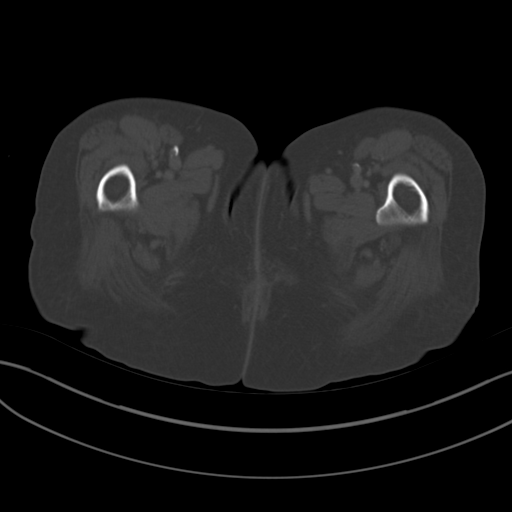
[im 11/84  soft-tissue]
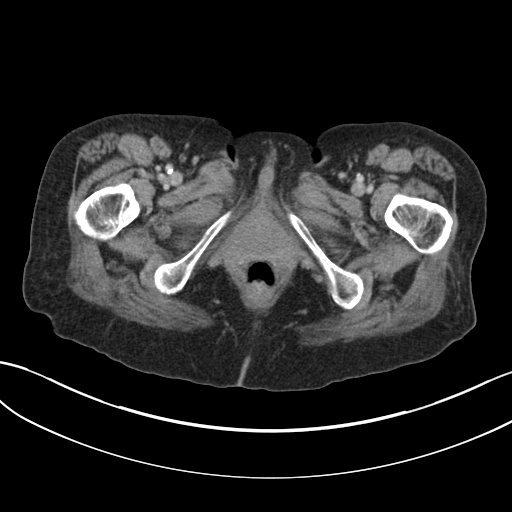
[im 16/84  soft-tissue]
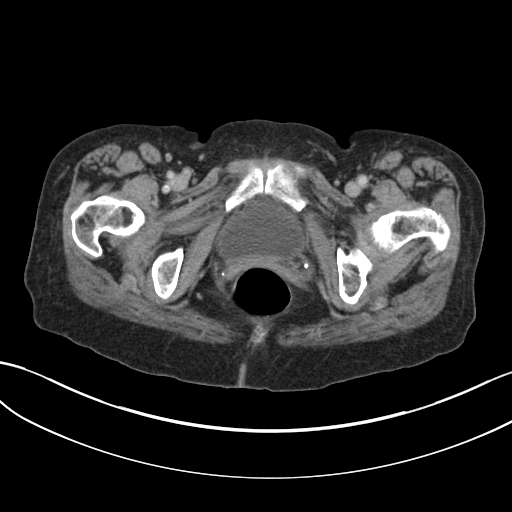
[im 26/84  soft-tissue]
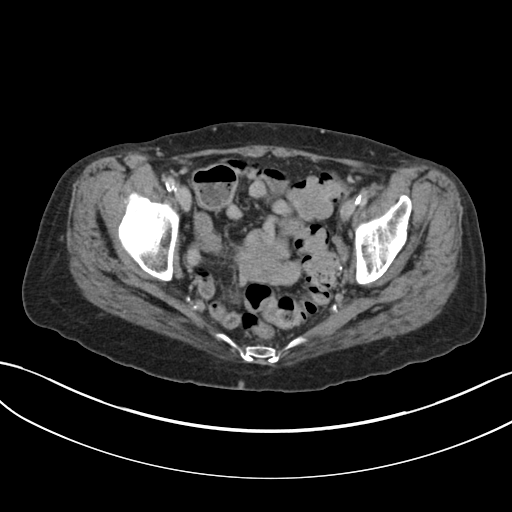
[im 32/84  soft-tissue]
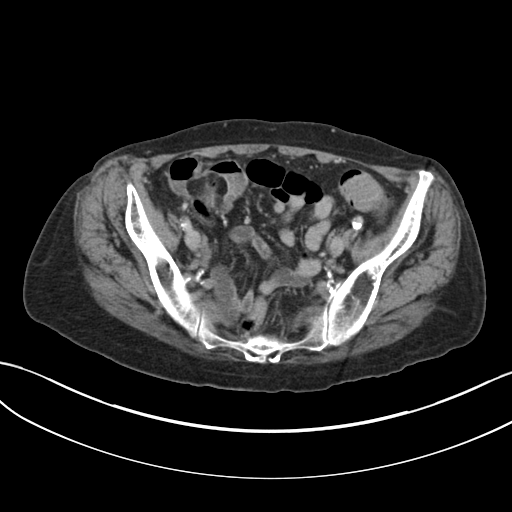
[im 37/84  soft-tissue]
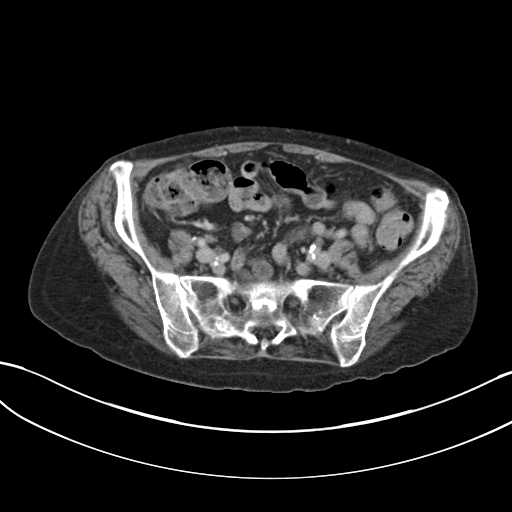
[im 42/84  soft-tissue]
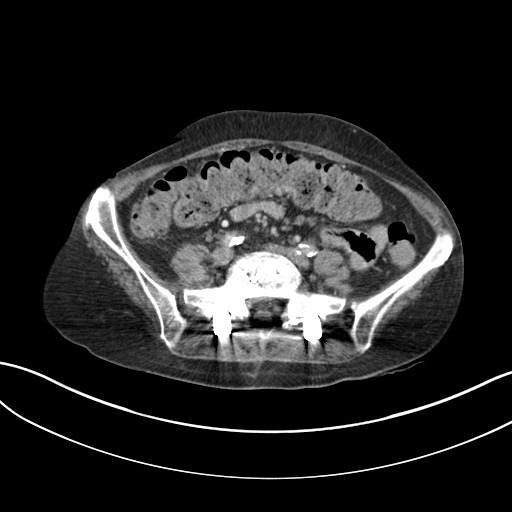
[im 47/84  soft-tissue]
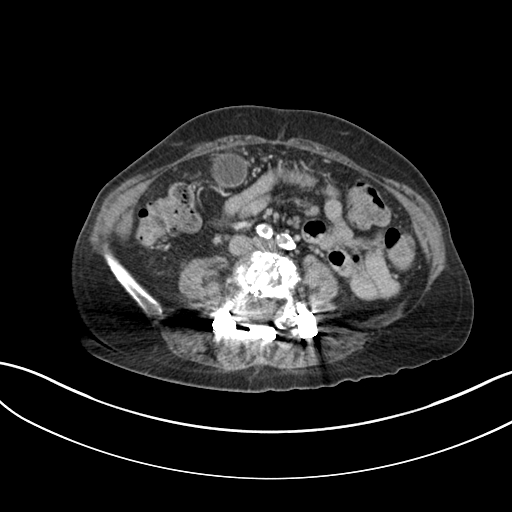
[im 52/84  soft-tissue]
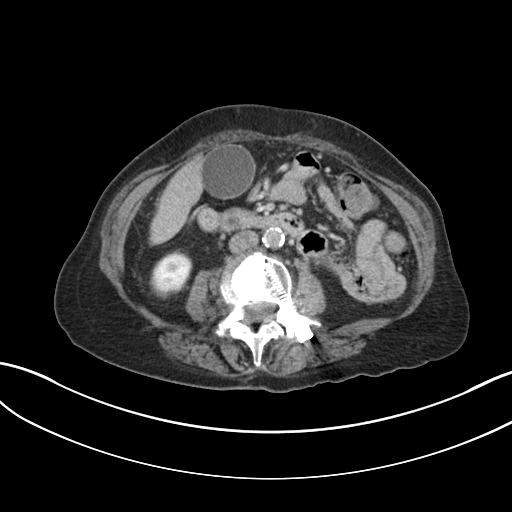
[im 52/84  bone]
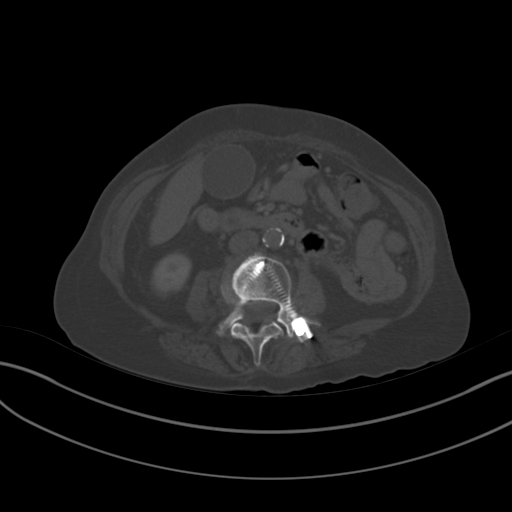
[im 58/84  soft-tissue]
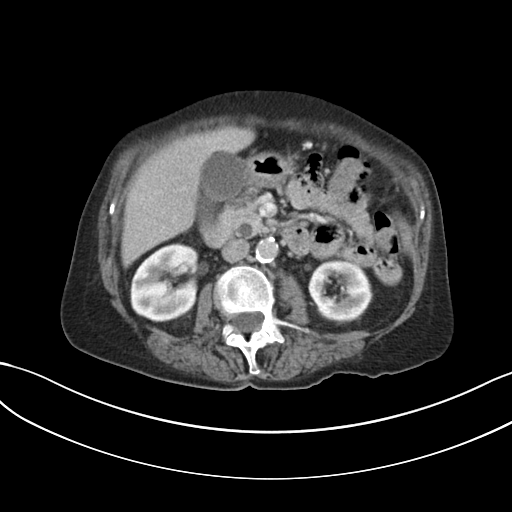
[im 68/84  soft-tissue]
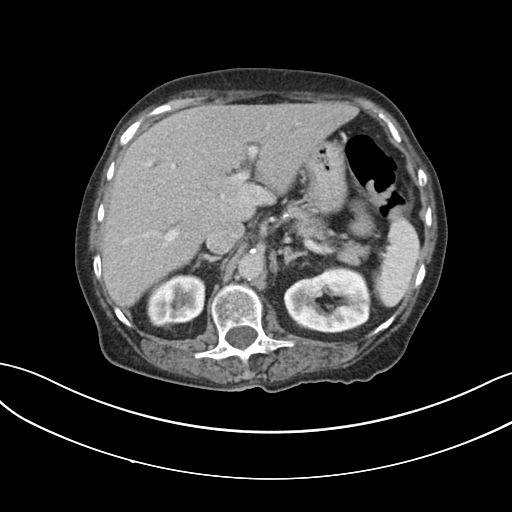
[im 73/84  soft-tissue]
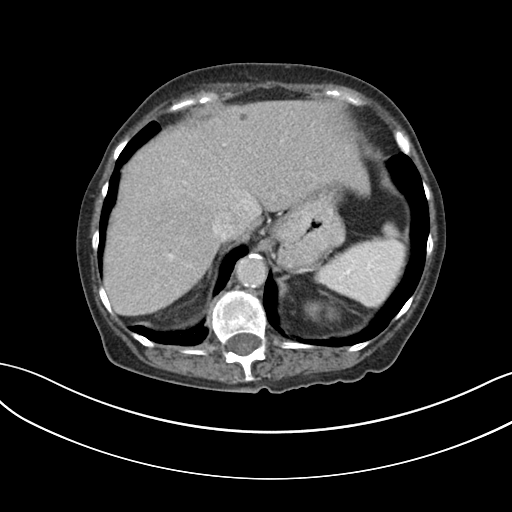
[im 78/84  soft-tissue]
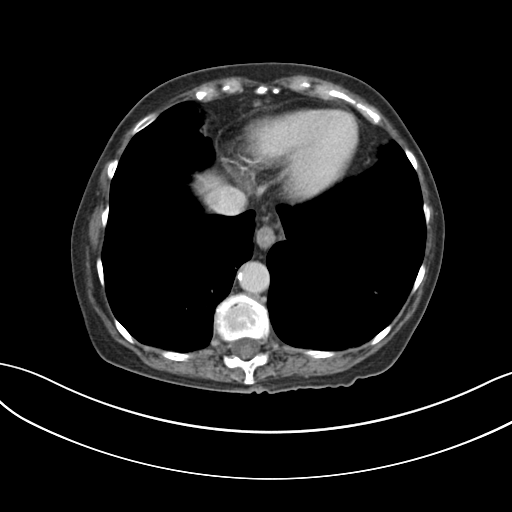

[Series 4: coronal a/|p · coronal · 0.94mm/px · 3 of 134 slices shown]
[im 45/134  soft-tissue]
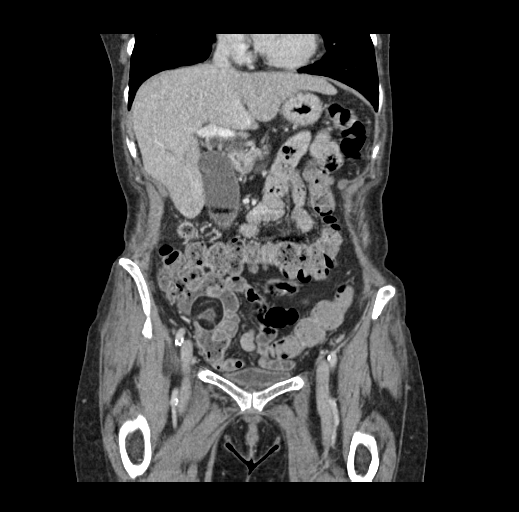
[im 60/134  soft-tissue]
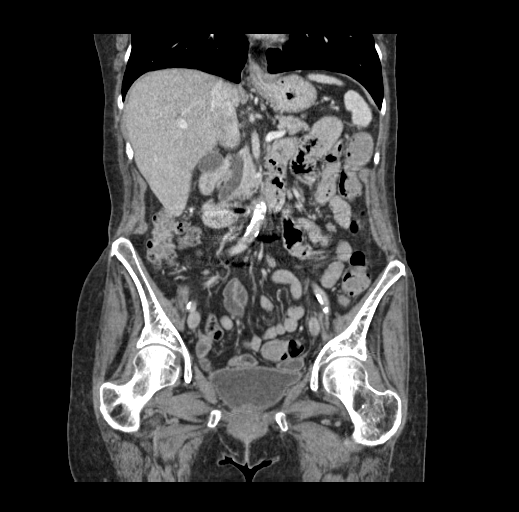
[im 74/134  soft-tissue]
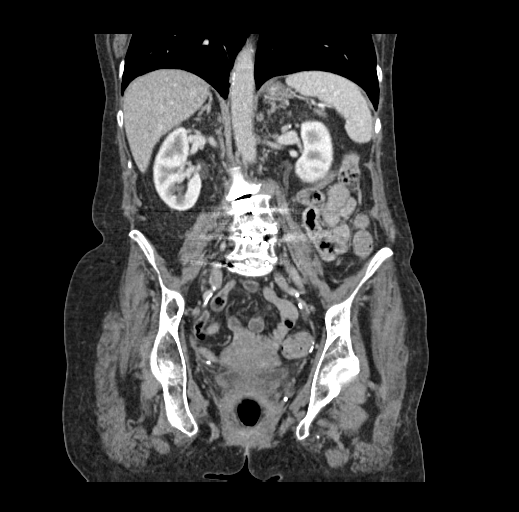

[16 of 46 positions shown; findings below may reference images not displayed]

FINDINGS: Lower chest: Lung bases are within normal. Couple tiny subcentimeter
right pericardiophrenic lymph nodes unchanged.

Hepatobiliary: Subcentimeter hypodensity over the left lobe of the
liver without significant change likely a cyst but too small to
characterize. Gallbladder is within normal. Mild stable prominence
of the common bowel duct measuring 8 mm.

Pancreas: Within normal.

Spleen: Within normal.

Adrenals/Urinary Tract: Adrenal glands are normal. Kidneys are
normal in size without hydronephrosis or nephrolithiasis. There are
a few small subcentimeter bilateral renal cortical hypodensities too
small to characterize but likely cysts without significant change.
Bladder and ureters are within normal.

Stomach/Bowel: Stomach and small bowel are unremarkable. Previous
appendectomy. Colon is within normal.

Vascular/Lymphatic: Moderate calcified plaque over the abdominal
aorta and iliac arteries. No significant adenopathy.

Reproductive: Within normal.

Other: No free fluid or focal inflammatory change visualized.

Musculoskeletal: Degenerative changes of the spine with significant
disc space narrowing at the L3-4 level. Posterior fusion hardware
intact at the L4-S1 level.
IMPRESSION: No acute findings in the abdomen/pelvis.

Bilateral subcentimeter renal cortical hypodensities unchanged and
too small to characterize but likely cysts.

Subcentimeter hypodensity over the left lobe of the liver unchanged
too small to characterize but likely a cyst.

Aortic Atherosclerosis ([id number]).

## 2019-04-07 MED ORDER — SODIUM CHLORIDE 0.9 % IV BOLUS
500.0000 mL | Freq: Once | INTRAVENOUS | Status: AC
  Administered 2019-04-07: 500 mL via INTRAVENOUS

## 2019-04-07 MED ORDER — POTASSIUM CHLORIDE CRYS ER 20 MEQ PO TBCR
40.0000 meq | EXTENDED_RELEASE_TABLET | Freq: Once | ORAL | Status: AC
  Administered 2019-04-08: 40 meq via ORAL
  Filled 2019-04-07: qty 2

## 2019-04-07 MED ORDER — ACETAMINOPHEN 325 MG PO TABS
650.0000 mg | ORAL_TABLET | Freq: Once | ORAL | Status: AC
  Administered 2019-04-07: 17:00:00 650 mg via ORAL
  Filled 2019-04-07: qty 2

## 2019-04-07 MED ORDER — BUDESONIDE 0.25 MG/2ML IN SUSP
0.2500 mg | Freq: Two times a day (BID) | RESPIRATORY_TRACT | Status: DC
  Filled 2019-04-07 (×2): qty 2

## 2019-04-07 MED ORDER — SODIUM CHLORIDE 0.9 % IV SOLN
500.0000 mg | Freq: Once | INTRAVENOUS | Status: AC
  Administered 2019-04-07: 500 mg via INTRAVENOUS
  Filled 2019-04-07: qty 500

## 2019-04-07 MED ORDER — ASPIRIN 81 MG PO CHEW
324.0000 mg | CHEWABLE_TABLET | Freq: Once | ORAL | Status: AC
  Administered 2019-04-07: 324 mg via ORAL
  Filled 2019-04-07: qty 4

## 2019-04-07 MED ORDER — IPRATROPIUM-ALBUTEROL 0.5-2.5 (3) MG/3ML IN SOLN: 3.0000 mL | Freq: Four times a day (QID) | RESPIRATORY_TRACT | Status: DC

## 2019-04-07 MED ORDER — IOHEXOL 350 MG/ML SOLN
100.0000 mL | Freq: Once | INTRAVENOUS | Status: AC | PRN
  Administered 2019-04-07: 100 mL via INTRAVENOUS

## 2019-04-07 MED ORDER — ALBUTEROL SULFATE HFA 108 (90 BASE) MCG/ACT IN AERS
4.0000 | INHALATION_SPRAY | Freq: Once | RESPIRATORY_TRACT | Status: AC
  Administered 2019-04-07: 17:00:00 4 via RESPIRATORY_TRACT
  Filled 2019-04-07: qty 6.7

## 2019-04-07 MED ORDER — HEPARIN (PORCINE) 25000 UT/250ML-% IV SOLN
1100.0000 [IU]/h | INTRAVENOUS | Status: AC
  Administered 2019-04-07: 600 [IU]/h via INTRAVENOUS
  Administered 2019-04-09: 800 [IU]/h via INTRAVENOUS
  Administered 2019-04-10 (×2): 1100 [IU]/h via INTRAVENOUS
  Filled 2019-04-07 (×3): qty 250

## 2019-04-07 MED ORDER — HEPARIN BOLUS VIA INFUSION
2500.0000 [IU] | Freq: Once | INTRAVENOUS | Status: AC
  Administered 2019-04-07: 20:00:00 2500 [IU] via INTRAVENOUS
  Filled 2019-04-07: qty 2500

## 2019-04-07 MED ORDER — SODIUM CHLORIDE 0.9 % IV BOLUS
1000.0000 mL | Freq: Once | INTRAVENOUS | Status: AC
  Administered 2019-04-07: 17:00:00 1000 mL via INTRAVENOUS

## 2019-04-07 MED ORDER — IPRATROPIUM-ALBUTEROL 0.5-2.5 (3) MG/3ML IN SOLN: 3.0000 mL | Freq: Four times a day (QID) | RESPIRATORY_TRACT | Status: DC | PRN

## 2019-04-07 MED ORDER — IPRATROPIUM-ALBUTEROL 20-100 MCG/ACT IN AERS
1.0000 | INHALATION_SPRAY | Freq: Four times a day (QID) | RESPIRATORY_TRACT | Status: DC
  Administered 2019-04-08 – 2019-04-11 (×13): 1 via RESPIRATORY_TRACT
  Filled 2019-04-07: qty 4

## 2019-04-07 MED ORDER — IPRATROPIUM-ALBUTEROL 20-100 MCG/ACT IN AERS: 1.0000 | INHALATION_SPRAY | Freq: Four times a day (QID) | RESPIRATORY_TRACT | Status: DC | PRN

## 2019-04-07 MED ORDER — SODIUM CHLORIDE 0.9 % IV SOLN
1.0000 g | Freq: Once | INTRAVENOUS | Status: AC
  Administered 2019-04-07: 1 g via INTRAVENOUS
  Filled 2019-04-07: qty 10

## 2019-04-07 MED ORDER — BUDESONIDE 180 MCG/ACT IN AEPB
1.0000 | INHALATION_SPRAY | Freq: Two times a day (BID) | RESPIRATORY_TRACT | Status: DC
  Administered 2019-04-08 – 2019-04-10 (×6): 1 via RESPIRATORY_TRACT
  Filled 2019-04-07: qty 1

## 2019-04-07 MED ORDER — ONDANSETRON HCL 4 MG/2ML IJ SOLN: 4.0000 mg | Freq: Once | INTRAMUSCULAR | Status: DC

## 2019-04-07 NOTE — Progress Notes (Signed)
ANTICOAGULATION CONSULT NOTE - Initial Consult  Pharmacy Consult:  Heparin Indication: chest pain/ACS  Allergies  Allergen Reactions  . Atorvastatin Nausea And Vomiting  . Levofloxacin     Reaction: achilles tendon pain  . Omnicef [Cefdinir] Diarrhea    Uncontrollable diarrhea  . Trazodone And Nefazodone     "Felt like I was going to faint"  . Sulfonamide Derivatives Rash    Patient Measurements: Height: 5\' 3"  (160 cm) Weight: 117 lb (53.1 kg) IBW/kg (Calculated) : 52.4 Heparin Dosing Weight: 53 kg  Vital Signs: Temp: 100.2 F (37.9 C) (04/10 1532) Temp Source: Oral (04/10 1532) BP: 114/67 (04/10 1818) Pulse Rate: 95 (04/10 1821)  Labs: Recent Labs    04/07/19 1700  HGB 13.4  HCT 42.2  PLT 295  CREATININE 1.00  TROPONINI 1.07*    Estimated Creatinine Clearance: 46.4 mL/min (by C-G formula based on SCr of 1 mg/dL).   Medical History: Past Medical History:  Diagnosis Date  . Acute cystitis   . Acute respiratory failure ([place])   . Allergy    as a child grew out of them  . Anemia    pernicious anemia  . Anxiety   . Arthritis   . Back pain   . Blood transfusion   . CAP (community acquired pneumonia)   . Chronic pain syndrome   . Depression   . Diverticulosis of colon   . Dysthymia   . Esophagitis   . EtOH dependence ([place])   . Gastritis   . GERD (gastroesophageal reflux disease)   . H/O chest pain Dec. 2013   no work up done  . Hx of colonic polyps   . Hypertension   . Irritable bowel syndrome   . Osteopenia   . Osteoporosis   . Pneumonia 2019  . Polypharmacy   . Reflux esophagitis   . Right sided sciatica 12/22/2017  . Tobacco use disorder   . Unspecified chronic bronchitis ([place])   . Vertigo   . Vitamin B12 deficiency   . Wears glasses      Assessment: 69 YOF presented with SOB and back pain.  Found to have elevated troponin and Pharmacy consulted to initiate IV heparin for ACS.  Baseline labs reviewed.  Goal of Therapy:  Heparin  level 0.3-0.7 units/ml Monitor platelets by anticoagulation protocol: Yes   Plan:  Heparin 2500 units IV bolus, then Heparin gtt at 600 units/hr Check 6 hr heparin level Daily heparin level and CBC  Chabeli Barsamian D. Mina Marble, PharmD, BCPS, [place] 04/07/2019, 6:43 PM

## 2019-04-07 NOTE — Consult Note (Signed)
Cardiology Consultation:   Patient ID: Denise King MRN: [id number]; DOB: 10/19/1951  Admit date: 04/07/2019 Date of Consult: 04/07/2019  Primary Care Provider: Biagio Borg, MD Primary Cardiologist: No primary care provider on file. New/Masud Holub Primary Electrophysiologist:  None      History of Present Illness:   Denise King is a 68 y.o. female with a hx of smoking who is being seen today for the evaluation of elevated troponin at the request of Cortni Couture PA-C  No history of cardiac disease 48 hours dry cough and dyspnea then had nausea and vomiting Has had weeks of diarrhea ? Due to [name]. After vomiting had mild central chest pressure now gone. She is weak with malaise Has back, rib and flank pain Denies dysuria She is a ppd smoker active ove r40 years. No sick contacts or current fever CXR and CT in ER ok. ECG NSR rate 92 totally normal with no signs of ischemia or pericarditis Troponin 1.07 WBC mildly elevated 10.7   Past Medical History:  Diagnosis Date   Acute cystitis    Acute respiratory failure ([place])    Allergy    as a child grew out of them   Anemia    pernicious anemia   Anxiety    Arthritis    Back pain    Blood transfusion    CAP (community acquired pneumonia)    Chronic pain syndrome    Depression    Diverticulosis of colon    Dysthymia    Esophagitis    EtOH dependence ([place])    Gastritis    GERD (gastroesophageal reflux disease)    H/O chest pain Dec. 2013   no work up done   Hx of colonic polyps    Hypertension    Irritable bowel syndrome    Osteopenia    Osteoporosis    Pneumonia 2019   Polypharmacy    Reflux esophagitis    Right sided sciatica 12/22/2017   Tobacco use disorder    Unspecified chronic bronchitis (HCC)    Vertigo    Vitamin B12 deficiency    Wears glasses     Past Surgical History:  Procedure Laterality Date   APPENDECTOMY     BACK SURGERY  10-09   Dr. Patrice Paradise   CARPAL  TUNNEL RELEASE Right 08/14/2014   Procedure: RIGHT CARPAL TUNNEL RELEASE AND INJECT LEFT THUMB;  Surgeon: Daryll Brod, MD;  Location: [facility];  Service: Orthopedics;  Laterality: Right;   COLONOSCOPY     INTRAMEDULLARY (IM) NAIL INTERTROCHANTERIC Right 10/01/2018   Procedure: INTRAMEDULLARY (IM) NAIL INTERTROCHANTRIC;  Surgeon: Thornton Park, MD;  Location: [facility];  Service: Orthopedics;  Laterality: Right;   LAPAROSCOPY N/A 02/21/2013   Procedure: LAPAROSCOPY OPERATIVE;  Surgeon: Margarette Asal, MD;  Location: [facility];  Service: Gynecology;  Laterality: N/A;  REQUESTING 5MM SCOPE WITH CAMERA   TONSILLECTOMY     TUBAL LIGATION     UPPER GASTROINTESTINAL ENDOSCOPY       Home Medications:  Prior to Admission medications   Medication Sig Start Date End Date Taking? Authorizing Provider  acetaminophen (TYLENOL) 325 MG tablet Take 1-2 tablets (325-650 mg total) by mouth every 6 (six) hours as needed for mild pain (pain score 1-3 or temp > 100.5). Patient not taking: Reported on 03/21/2019 10/05/18   Dustin Flock, MD  ARIPiprazole (ABILIFY) 2 MG tablet Take 1 tablet (2 mg total) by mouth daily for 30 days. Patient not taking: Reported on 03/21/2019 03/21/19 04/20/19  Pucilowski, Marchia Bond, MD  cholestyramine Lucrezia Starch) 4 GM/DOSE powder Take 1 packet (4 g total) by mouth 2 (two) times daily with a meal. 03/24/19   Gatha Mayer, MD  clonazePAM (KLONOPIN) 1 MG tablet Take 1 tablet (1 mg total) by mouth at bedtime. 02/07/19 05/08/19  Pucilowski, Marchia Bond, MD  diphenoxylate-atropine (LOMOTIL) 2.5-0.025 MG tablet Take 1 tablet by mouth 4 (four) times daily as needed for diarrhea or loose stools. 08/15/18   Gatha Mayer, MD  docusate sodium (COLACE) 100 MG capsule Take 1 capsule (100 mg total) by mouth 2 (two) times daily. Patient not taking: Reported on 03/21/2019 10/05/18   Dustin Flock, MD  fentaNYL (DURAGESIC) 25 MCG/HR Place 1 patch onto the skin every other day. 03/06/19    [provider]  folic acid (FOLVITE) 1 MG tablet Take 1 tablet (1 mg total) by mouth daily. Patient not taking: Reported on 03/21/2019 09/05/18   Biagio Borg, MD  hyoscyamine (ANASPAZ) 0.125 MG TBDP disintergrating tablet Place 0.125 mg under the tongue every 6 (six) hours as needed.    [provider]  Oxycodone HCl 10 MG TABS Take 10 mg by mouth every 4 (four) hours as needed. 03/06/19   [provider]  pantoprazole (PROTONIX) 40 MG tablet TAKE 1 TABLET 30 MINUTES twice per day Patient not taking: Reported on 01/10/2019 06/24/18   Biagio Borg, MD  polyethylene glycol [facility] Floria Raveling) packet Take 17 g by mouth daily as needed for mild constipation. 10/05/18   Dustin Flock, MD  senna (SENOKOT) 8.6 MG TABS tablet Take 1 tablet by mouth 2 (two) times daily as needed for mild constipation.    [provider]  sertraline (ZOLOFT) 100 MG tablet Take 1.5 tablets (150 mg total) by mouth daily. 03/21/19 06/19/19  Pucilowski, Marchia Bond, MD  triamcinolone cream (KENALOG) 0.1 % Apply 1 application topically 2 (two) times daily as needed. 03/14/19   [provider]  vitamin B-12 (CYANOCOBALAMIN) 100 MCG tablet Take 1 tablet (100 mcg total) by mouth daily. 11/03/18   Biagio Borg, MD    Inpatient Medications: Scheduled Meds:  Continuous Infusions:  azithromycin     heparin 600 Units/hr (04/07/19 1932)   sodium chloride     PRN Meds:   Allergies:    Allergies  Allergen Reactions   Atorvastatin Nausea And Vomiting   Levofloxacin     Reaction: achilles tendon pain   Omnicef [Cefdinir] Diarrhea    Uncontrollable diarrhea   Trazodone And Nefazodone     "Felt like I was going to faint"   Sulfonamide Derivatives Rash    Social History:   Social History   Socioeconomic History   Marital status: Married    Spouse name: Not on file   Number of children: 4   Years of education: 16   Highest education level: Not on file  Occupational  History   Occupation: retired    Comment: Armed forces technical officer strain: Not on file   Food insecurity:    Worry: Not on file    Inability: Not on file   Transportation needs:    Medical: Not on file    Non-medical: Not on file  Tobacco Use   Smoking status: Current Every Day Smoker    Packs/day: 1.00    Years: 30.00    Pack years: 30.00    Types: Cigarettes   Smokeless tobacco: Never Used  Substance and Sexual Activity   Alcohol  use: Yes    Alcohol/week: 3.0 standard drinks    Types: 3 Standard drinks or equivalent per week    Comment: occasional   Drug use: No   Sexual activity: Not on file  Lifestyle   Physical activity:    Days per week: Not on file    Minutes per session: Not on file   Stress: Not on file  Relationships   Social connections:    Talks on phone: Not on file    Gets together: Not on file    Attends religious service: Not on file    Active member of club or organization: Not on file    Attends meetings of clubs or organizations: Not on file    Relationship status: Not on file   Intimate partner violence:    Fear of current or ex partner: Not on file    Emotionally abused: Not on file    Physically abused: Not on file    Forced sexual activity: Not on file  Other Topics Concern   Not on file  Social History Narrative   Married x 38 yrs. Has BS degree in Horticulture from [employer]. Currently resides in a house with her husband. 3 cats.  Fun: used to like to do a lot of things.    Denies religious beliefs that would effect health care.    lives with husband [name]; quit smoking- October 6th, 2019. ocasional alcohol. [employer] [2009] on disability sec to back issues.     Family History:    Family History  Problem Relation Age of Onset   Colon cancer Father    Prostate cancer Father    Heart disease Mother        prev MVR, also has DJD   Colon cancer Brother    Skin cancer Sister     Alcohol abuse Daughter    Bipolar disorder Daughter    Drug abuse Daughter    Esophageal cancer Neg Hx    Rectal cancer Neg Hx    Stomach cancer Neg Hx      ROS:  Please see the history of present illness.   All other ROS reviewed and negative.     Physical Exam/Data:   Vitals:   04/07/19 1818 04/07/19 1820 04/07/19 1821 04/07/19 1933  BP: 114/67   93/60  Pulse: (!) 108 99 95 94  Resp:    19  Temp:      TempSrc:      SpO2:  95% 98% 97%  Weight:      Height:        Intake/Output Summary (Last 24 hours) at 04/07/2019 2000 Last data filed at 04/07/2019 1820 Gross per 24 hour  Intake 1000 ml  Output --  Net 1000 ml   Last 3 Weights 04/07/2019 11/03/2018 10/19/2018  Weight (lbs) 117 lb 122 lb 119 lb  Weight (kg) 53.071 kg 55.339 kg 53.978 kg  Some encounter information is confidential and restricted. Go to Review Flowsheets activity to see all data.     Body mass index is 20.73 kg/m.  General:  Well nourished, well developed, in no acute distress  HEENT: normal Lymph: no adenopathy Neck: no JVD Endocrine:  No thryomegaly Vascular: No carotid bruits; FA pulses 2+ bilaterally without bruits  Cardiac:  normal S1, S2; RRR; no murmur   Lungs:  clear to auscultation bilaterally, no wheezing, rhonchi or rales  Abd: soft, nontender, no hepatomegaly  Ext: no edema Musculoskeletal:  No deformities, BUE and BLE strength  normal and equal Skin: warm and dry  Neuro:  CNs 2-12 intact, no focal abnormalities noted Psych:  Normal affect   EKG:  NSR rate 92 normal  Telemetry:  Telemetry was personally reviewed and demonstrates:  NSR no arrhythmia  Relevant CV Studies: None   Laboratory Data:  Chemistry Recent Labs  Lab 04/07/19 1700  NA 135  K 3.5  CL 93*  CO2 29  GLUCOSE 114*  BUN 5*  CREATININE 1.00  CALCIUM 9.1  GFRNONAA 59*  GFRAA >60  ANIONGAP 13    Recent Labs  Lab 04/07/19 1700  PROT 6.7  ALBUMIN 3.9  AST 25  ALT 11  ALKPHOS 95  BILITOT 0.6    Hematology Recent Labs  Lab 04/07/19 1700  WBC 10.7*  RBC 4.11  HGB 13.4  HCT 42.2  MCV 102.7*  MCH 32.6  MCHC 31.8  RDW 13.5  PLT 295   Cardiac Enzymes Recent Labs  Lab 04/07/19 1700  TROPONINI 1.07*   No results for input(s): TROPIPOC in the last 168 hours.  BNPNo results for input(s): BNP, PROBNP in the last 168 hours.  DDimer  Recent Labs  Lab 04/07/19 1700  DDIMER 0.84*    Radiology/Studies:  Ct Angio Chest Pe W And/or Wo Contrast  Result Date: 04/07/2019 CLINICAL DATA:  Shortness of breath. Cough. Fever. PE suspected, intermediate prob, neg D-dimer EXAM: CT ANGIOGRAPHY CHEST WITH CONTRAST TECHNIQUE: Multidetector CT imaging of the chest was performed using the standard protocol during bolus administration of intravenous contrast. Multiplanar CT image reconstructions and MIPs were obtained to evaluate the vascular anatomy. CONTRAST:  125mL OMNIPAQUE IOHEXOL 350 MG/ML SOLN COMPARISON:  Chest radiograph earlier this day. Chest CT 05/13/2018 FINDINGS: Cardiovascular: There are no filling defects within the pulmonary arteries to suggest pulmonary embolus. Atherosclerosis of the thoracic aorta without aneurysm or dissection. No evidence of acute aortic syndrome. Coronary artery calcifications. Heart is normal in size. No pericardial effusion. Mediastinum/Nodes: Small mediastinal nodes are not enlarged by size criteria. No hilar adenopathy. Improved esophageal wall thickening from prior. No dominant thyroid nodule. Lungs/Pleura: Dependent linear opacities may be combination of atelectasis and scarring. Mild emphysema. Pulmonary edema or acute airspace disease. No pleural fluid. Trachea and mainstem bronchi are patent. Upper Abdomen: No acute findings. Musculoskeletal: Remote lower sternal fracture. There are no acute or suspicious osseous abnormalities. Review of the MIP images confirms the above findings. IMPRESSION: 1. No pulmonary embolus. Bibasilar atelectasis or scarring. No  acute intrathoracic abnormality. 2. Coronary artery calcifications. Aortic Atherosclerosis (ICD10-I70.0). Electronically Signed   By: Keith Rake M.D.   On: 04/07/2019 19:24   Dg Chest Portable 1 View  Result Date: 04/07/2019 CLINICAL DATA:  Patient reports shortness of breath, non-productive cough, fever onset yesterday and over the night. EXAM: PORTABLE CHEST 1 VIEW COMPARISON:  Chest x-ray dated 10/01/2018 FINDINGS: Heart size and mediastinal contours are within normal limits. Lungs are clear. No pleural effusion or pneumothorax seen. Osseous structures about the chest are unremarkable. IMPRESSION: No active disease. No evidence of pneumonia or pulmonary edema. Electronically Signed   By: Franki Cabot M.D.   On: 04/07/2019 17:53    Assessment and Plan:   1. Elevated Troponin. Smoker with no previous coronary history and totally normal ECG Pain atypical started after vomiting. Doubt coronary ischemia ER has already started on heparin which is ok until 2nd troponin comes back Echo will need to wait until COVID testing comes back and negative 2. Dyspnea: etiology not clear but clinically URI Encouraging  that CXR/CT ok check BNP If elevated and troponin goes higher would be reasonable to proceed with TTE before COVID testing back 3. Nausea/Vomiting: abdomen is benign WBC mildly elevated history of [name] observe per primary service  4. Smoking: counseled on smoking cessation for less than 10 minutes CXR ok       For questions or updates, please contact [place] Please consult [email or web address] for contact info under     Signed, Jenkins Rouge, MD  04/07/2019 8:00 PM

## 2019-04-07 NOTE — Telephone Encounter (Signed)
Pt. Reports she started feeling bad last night. A little cough, achy all over. Hurts to take a deep breath.Feels short of breath. Temp. 100. Reports she had pneumonia last year. Instructed pt. To go to UC or Ed for evaluation.Verbalizes understanding.   Reason for Disposition . Patient sounds very sick or weak to the triager  Answer Assessment - Initial Assessment Questions 1. RESPIRATORY STATUS: "Describe your breathing?" (e.g., wheezing, shortness of breath, unable to speak, severe coughing)      Shortness of breath 2. ONSET: "When did this breathing problem begin?"      Started feeling bad last night 3. PATTERN "Does the difficult breathing come and go, or has it been constant since it started?"      Comes and goes 4. SEVERITY: "How bad is your breathing?" (e.g., mild, moderate, severe)    - MILD: No SOB at rest, mild SOB with walking, speaks normally in sentences, can lay down, no retractions, pulse < 100.    - MODERATE: SOB at rest, SOB with minimal exertion and prefers to sit, cannot lie down flat, speaks in phrases, mild retractions, audible wheezing, pulse 100-120.    - SEVERE: Very SOB at rest, speaks in single words, struggling to breathe, sitting hunched forward, retractions, pulse > 120      Moderate 5. RECURRENT SYMPTOM: "Have you had difficulty breathing before?" If so, ask: "When was the last time?" and "What happened that time?"      No 6. CARDIAC HISTORY: "Do you have any history of heart disease?" (e.g., heart attack, angina, bypass surgery, angioplasty)      No 7. LUNG HISTORY: "Do you have any history of lung disease?"  (e.g., pulmonary embolus, asthma, emphysema)     Pneumonia last year 106. CAUSE: "What do you think is causing the breathing problem?"      Unsure 9. OTHER SYMPTOMS: "Do you have any other symptoms? (e.g., dizziness, runny nose, cough, chest pain, fever)     Temp. 100, achy 10. PREGNANCY: "Is there any chance you are pregnant?" "When was your last  menstrual period?"       No 11. TRAVEL: "Have you traveled out of the country in the last month?" (e.g., travel history, exposures)       No  Protocols used: BREATHING DIFFICULTY-A-AH

## 2019-04-07 NOTE — ED Notes (Signed)
ED TO INPATIENT HANDOFF REPORT  ED Nurse Name and Phone #:  [phone] Patty, RN  S Name/Age/Gender Denise King 68 y.o. female Room/Bed: 023C/023C  Code Status   Code Status: Prior  Home/SNF/Other Home Patient oriented to: self, place, time and situation Is this baseline? Yes   Triage Complete: Triage complete  Chief Complaint SOB,Back Pain   Triage Note Patient reports shortness of breath, non-productive cough, fever onset yesterday and over the night. She also endorses lower back pain (hx chronic back pain, but this pain is new) that she is more concerned about. Resp e/u.    Allergies Allergies  Allergen Reactions  . Atorvastatin Nausea And Vomiting  . Levofloxacin Other (See Comments)    Reaction: achilles tendon pain  . Omnicef [Cefdinir] Diarrhea    Uncontrollable diarrhea  . Trazodone And Nefazodone Other (See Comments)    "Felt like I was going to faint"  . Sulfa Antibiotics Rash  . Sulfonamide Derivatives Rash    Level of Care/Admitting Diagnosis ED Disposition    ED Disposition Condition [facility] Area: [place] [[id number]]  Level of Care: Telemetry Medical [104]  Diagnosis: SOB (shortness of breath) [[id number]]  Admitting Physician: Shirlean Mylar  Attending Physician: Dana Allan I [[id number]]  Estimated length of stay: past midnight tomorrow  Certification:: I certify this patient will need inpatient services for at least 2 midnights  Bed request comments: 2 west - Low to moderate risk  PT Class (Do Not Modify): Inpatient [101]  PT Acc Code (Do Not Modify): Private [1]       B Medical/Surgery History Past Medical History:  Diagnosis Date  . Acute cystitis   . Acute respiratory failure ([place])   . Allergy    as a child grew out of them  . Anemia    pernicious anemia  . Anxiety   . Arthritis   . Back pain   . Blood transfusion   . CAP (community acquired pneumonia)   . Chronic pain syndrome    . Depression   . Diverticulosis of colon   . Dysthymia   . Esophagitis   . EtOH dependence ([place])   . Gastritis   . GERD (gastroesophageal reflux disease)   . H/O chest pain Dec. 2013   no work up done  . Hx of colonic polyps   . Hypertension   . Irritable bowel syndrome   . Osteopenia   . Osteoporosis   . Pneumonia 2019  . Polypharmacy   . Reflux esophagitis   . Right sided sciatica 12/22/2017  . Tobacco use disorder   . Unspecified chronic bronchitis ([place])   . Vertigo   . Vitamin B12 deficiency   . Wears glasses    Past Surgical History:  Procedure Laterality Date  . APPENDECTOMY    . BACK SURGERY  10-09   Dr. Patrice Paradise  . CARPAL TUNNEL RELEASE Right 08/14/2014   Procedure: RIGHT CARPAL TUNNEL RELEASE AND INJECT LEFT THUMB;  Surgeon: Daryll Brod, MD;  Location: [facility];  Service: Orthopedics;  Laterality: Right;  . COLONOSCOPY    . INTRAMEDULLARY (IM) NAIL INTERTROCHANTERIC Right 10/01/2018   Procedure: INTRAMEDULLARY (IM) NAIL INTERTROCHANTRIC;  Surgeon: Thornton Park, MD;  Location: [facility];  Service: Orthopedics;  Laterality: Right;  . LAPAROSCOPY N/A 02/21/2013   Procedure: LAPAROSCOPY OPERATIVE;  Surgeon: Margarette Asal, MD;  Location: [facility];  Service: Gynecology;  Laterality: N/A;  REQUESTING 5MM SCOPE WITH CAMERA  .  TONSILLECTOMY    . TUBAL LIGATION    . UPPER GASTROINTESTINAL ENDOSCOPY       A IV Location/Drains/Wounds Patient Lines/Drains/Airways Status   Active Line/Drains/Airways    Name:   Placement date:   Placement time:   Site:   Days:   Peripheral IV 04/07/19 Right Antecubital   04/07/19    1655    Antecubital   less than 1   Peripheral IV 04/07/19 Left Forearm   04/07/19    1929    Forearm   less than 1   Incision - 3 Ports Other (Comment) Right Mid Left   10/01/18    1422     188          Intake/Output Last 24 hours  Intake/Output Summary (Last 24 hours) at 04/07/2019 2142 Last data filed at 04/07/2019 2000 Gross per 24  hour  Intake 1100 ml  Output -  Net 1100 ml    Labs/Imaging Results for orders placed or performed during the hospital encounter of 04/07/19 (from the past 48 hour(s))  CBC with Differential     Status: Abnormal   Collection Time: 04/07/19  5:00 PM  Result Value Ref Range   WBC 10.7 (H) 4.0 - 10.5 K/uL   RBC 4.11 3.87 - 5.11 MIL/uL   Hemoglobin 13.4 12.0 - 15.0 g/dL   HCT 42.2 36.0 - 46.0 %   MCV 102.7 (H) 80.0 - 100.0 fL   MCH 32.6 26.0 - 34.0 pg   MCHC 31.8 30.0 - 36.0 g/dL   RDW 13.5 11.5 - 15.5 %   Platelets 295 150 - 400 K/uL   nRBC 0.0 0.0 - 0.2 %   Neutrophils Relative % 58 %   Neutro Abs 6.1 1.7 - 7.7 K/uL   Lymphocytes Relative 31 %   Lymphs Abs 3.3 0.7 - 4.0 K/uL   Monocytes Relative 11 %   Monocytes Absolute 1.2 (H) 0.1 - 1.0 K/uL   Eosinophils Relative 0 %   Eosinophils Absolute 0.0 0.0 - 0.5 K/uL   Basophils Relative 0 %   Basophils Absolute 0.0 0.0 - 0.1 K/uL   Immature Granulocytes 0 %   Abs Immature Granulocytes 0.04 0.00 - 0.07 K/uL    Comment: Performed at [facility], [address], [place], [place] [address]  Comprehensive metabolic panel     Status: Abnormal   Collection Time: 04/07/19  5:00 PM  Result Value Ref Range   Sodium 135 135 - 145 mmol/L   Potassium 3.5 3.5 - 5.1 mmol/L   Chloride 93 (L) 98 - 111 mmol/L   CO2 29 22 - 32 mmol/L   Glucose, Bld 114 (H) 70 - 99 mg/dL   BUN 5 (L) 8 - 23 mg/dL   Creatinine, Ser 1.00 0.44 - 1.00 mg/dL   Calcium 9.1 8.9 - 10.3 mg/dL   Total Protein 6.7 6.5 - 8.1 g/dL   Albumin 3.9 3.5 - 5.0 g/dL   AST 25 15 - 41 U/L   ALT 11 0 - 44 U/L   Alkaline Phosphatase 95 38 - 126 U/L   Total Bilirubin 0.6 0.3 - 1.2 mg/dL   GFR calc non Af Amer 59 (L) >60 mL/min   GFR calc Af Amer >60 >60 mL/min   Anion gap 13 5 - 15    Comment: Performed at [facility] [address] [place], [place] [address]  Lipase, blood     Status: None   Collection Time: 04/07/19  5:00 PM  Result Value Ref Range   Lipase 25  11 - 51 U/L    Comment: Performed at [facility], [place] [address], [place], [place] [address]  Troponin I - ONCE - STAT     Status: Abnormal   Collection Time: 04/07/19  5:00 PM  Result Value Ref Range   Troponin I 1.07 (HH) <0.03 ng/mL    Comment: CRITICAL RESULT CALLED TO, READ BACK BY AND VERIFIED WITH: Mali BROWN,RN 1808 04/07/2019 WBOND Mali GROSE,RN Performed at [facility], [place] [address], [place], [place] [address] CORRECTED ON 04/10 AT 6644: PREVIOUSLY REPORTED AS 1.07 CRITICAL RESULT CALLED TO, READ BACK BY AND VERIFIED WITH: Mali BROWN,RN 1808 04/07/2019 WBOND   D-dimer, quantitative (not at [facility])     Status: Abnormal   Collection Time: 04/07/19  5:00 PM  Result Value Ref Range   D-Dimer, Quant 0.84 (H) 0.00 - 0.50 ug/mL-FEU    Comment: (NOTE) At the manufacturer cut-off of 0.50 ug/mL FEU, this assay has been documented to exclude PE with a sensitivity and negative predictive value of 97 to 99%.  At this time, this assay has not been approved by the FDA to exclude DVT/VTE. Results should be correlated with clinical presentation. Performed at [facility], [place] [address] [place], [place] [address]   Lactic acid, plasma     Status: None   Collection Time: 04/07/19  5:00 PM  Result Value Ref Range   Lactic Acid, Venous 1.0 0.5 - 1.9 mmol/L    Comment: Performed at [facility] [address], [place], [place] [address]  Troponin I - ONCE - STAT     Status: Abnormal   Collection Time: 04/07/19  8:07 PM  Result Value Ref Range   Troponin I 1.11 (HH) <0.03 ng/mL    Comment: CRITICAL VALUE NOTED.  VALUE IS CONSISTENT WITH PREVIOUSLY REPORTED AND CALLED VALUE. Performed at [facility], [place] [address] [place],  [address]   Urinalysis, Routine w reflex microscopic     Status: Abnormal   Collection Time: 04/07/19  8:40 PM  Result Value Ref Range   Color, Urine YELLOW YELLOW   APPearance HAZY (A) CLEAR   Specific Gravity, Urine 1.030  1.005 - 1.030   pH 6.0 5.0 - 8.0   Glucose, UA NEGATIVE NEGATIVE mg/dL   Hgb urine dipstick NEGATIVE NEGATIVE   Bilirubin Urine NEGATIVE NEGATIVE   Ketones, ur NEGATIVE NEGATIVE mg/dL   Protein, ur NEGATIVE NEGATIVE mg/dL   Nitrite NEGATIVE NEGATIVE   Leukocytes,Ua NEGATIVE NEGATIVE    Comment: Performed at [facility] [address], [place], [place] [address]   Ct Angio Chest Pe W And/or Wo Contrast  Result Date: 04/07/2019 CLINICAL DATA:  Shortness of breath. Cough. Fever. PE suspected, intermediate prob, neg D-dimer EXAM: CT ANGIOGRAPHY CHEST WITH CONTRAST TECHNIQUE: Multidetector CT imaging of the chest was performed using the standard protocol during bolus administration of intravenous contrast. Multiplanar CT image reconstructions and MIPs were obtained to evaluate the vascular anatomy. CONTRAST:  131mL OMNIPAQUE IOHEXOL 350 MG/ML SOLN COMPARISON:  Chest radiograph earlier this day. Chest CT 05/13/2018 FINDINGS: Cardiovascular: There are no filling defects within the pulmonary arteries to suggest pulmonary embolus. Atherosclerosis of the thoracic aorta without aneurysm or dissection. No evidence of acute aortic syndrome. Coronary artery calcifications. Heart is normal in size. No pericardial effusion. Mediastinum/Nodes: Small mediastinal nodes are not enlarged by size criteria. No hilar adenopathy. Improved esophageal  wall thickening from prior. No dominant thyroid nodule. Lungs/Pleura: Dependent linear opacities may be combination of atelectasis and scarring. Mild emphysema. Pulmonary edema or acute airspace disease. No pleural fluid. Trachea and mainstem bronchi are patent. Upper Abdomen: No acute findings. Musculoskeletal: Remote lower sternal fracture. There are no acute or suspicious osseous abnormalities. Review of the MIP images confirms the above findings. IMPRESSION: 1. No pulmonary embolus. Bibasilar atelectasis or scarring. No acute intrathoracic abnormality. 2. Coronary artery  calcifications. Aortic Atherosclerosis (ICD10-I70.0). Electronically Signed   By: Keith Rake M.D.   On: 04/07/2019 19:24   Dg Chest Portable 1 View  Result Date: 04/07/2019 CLINICAL DATA:  Patient reports shortness of breath, non-productive cough, fever onset yesterday and over the night. EXAM: PORTABLE CHEST 1 VIEW COMPARISON:  Chest x-ray dated 10/01/2018 FINDINGS: Heart size and mediastinal contours are within normal limits. Lungs are clear. No pleural effusion or pneumothorax seen. Osseous structures about the chest are unremarkable. IMPRESSION: No active disease. No evidence of pneumonia or pulmonary edema. Electronically Signed   By: Franki Cabot M.D.   On: 04/07/2019 17:53    Pending Labs Unresulted Labs (From admission, onward)    Start     Ordered   04/09/19 0500  Heparin level (unfractionated)  Daily,   R     04/07/19 1841   04/08/19 0500  CBC  Daily,   R     04/07/19 1841   04/08/19 0200  Heparin level (unfractionated)  Once-Timed,   R     04/07/19 1841   04/07/19 2143  Novel Coronavirus, NAA (hospital order; send-out to ref lab)  (Novel Coronavirus, NAA [facility] Order; send-out to ref lab) with precautions panel)  Once,   R    Question Answer Comment  Current symptoms Fever and Cough   Excluded other viral illnesses No (testing not indicated)   Patient immune status Normal      04/07/19 2142   04/07/19 2137  Sedimentation rate  Once,   R     04/07/19 2136   04/07/19 2137  C-reactive protein  Once,   R     04/07/19 2136   04/07/19 2122  Brain natriuretic peptide  Once,   R     04/07/19 2121   04/07/19 1621  Blood culture (routine x 2)  BLOOD CULTURE X 2,   STAT     04/07/19 1620   Signed and Held  Magnesium  Once,   R     Signed and Held   Signed and Held  Phosphorus  Once,   R     Signed and Held   Signed and Held  TSH  Once,   R     Signed and Held   Signed and [employer], sputum-assessment  Once,   R    Question:  Patient immune status  Answer:  Normal    Signed and Held   Signed and Held  Comprehensive metabolic panel  Tomorrow morning,   R     Signed and Held   Visual merchandiser and Held  CBC  Tomorrow morning,   R     Signed and Held   Signed and Held  Respiratory Panel by PCR  (Respiratory virus panel with precautions)  Once,   R    Question:  Patient immune status  Answer:  Normal   Signed and Held   Signed and Held  Influenza panel by PCR (type A & B)  (Influenza PCR Panel)  Once,   R  Signed and Held   Signed and Held  Troponin I - Once  Once,   R     Signed and Held          Vitals/Pain Today's Vitals   04/07/19 1945 04/07/19 2015 04/07/19 2030 04/07/19 2045  BP: 114/62 97/70 93/62  (!) 93/57  Pulse: (!) 103 94 82 86  Resp: 18     Temp:      TempSrc:      SpO2:  99% 100% 100%  Weight:      Height:      PainSc:        Isolation Precautions Droplet and Contact precautions  Medications Medications  heparin ADULT infusion 100 units/mL (25000 units/246mL sodium chloride 0.45%) (600 Units/hr Intravenous New Bag/Given 04/07/19 1932)  Ipratropium-Albuterol (COMBIVENT) respimat 1 puff (has no administration in time range)  Ipratropium-Albuterol (COMBIVENT) respimat 1 puff (has no administration in time range)  budesonide (PULMICORT) 180 MCG/ACT inhaler 1 puff (has no administration in time range)  albuterol (PROVENTIL HFA;VENTOLIN HFA) 108 (90 Base) MCG/ACT inhaler 4 puff (4 puffs Inhalation Given 04/07/19 1658)  sodium chloride 0.9 % bolus 1,000 mL (0 mLs Intravenous Stopped 04/07/19 1820)  acetaminophen (TYLENOL) tablet 650 mg (650 mg Oral Given 04/07/19 1657)  aspirin chewable tablet 324 mg (324 mg Oral Given 04/07/19 1824)  cefTRIAXone (ROCEPHIN) 1 g in sodium chloride 0.9 % 100 mL IVPB (0 g Intravenous Stopped 04/07/19 2000)  azithromycin (ZITHROMAX) 500 mg in sodium chloride 0.9 % 250 mL IVPB (500 mg Intravenous New Bag/Given 04/07/19 2004)  heparin bolus via infusion 2,500 Units (2,500 Units Intravenous Bolus from Bag 04/07/19  1932)  iohexol (OMNIPAQUE) 350 MG/ML injection 100 mL (100 mLs Intravenous Contrast Given 04/07/19 1850)  sodium chloride 0.9 % bolus 500 mL (500 mLs Intravenous New Bag/Given 04/07/19 2001)    Mobility walks Low fall risk   Focused Assessments Cardiac Assessment Handoff:  Cardiac Rhythm: Normal sinus rhythm Lab Results  Component Value Date   CKTOTAL 40 08/02/2007   CKMB 0.5 08/02/2007   TROPONINI 1.11 (HH) 04/07/2019   Lab Results  Component Value Date   DDIMER 0.84 (H) 04/07/2019   Does the Patient currently have chest pain? No     R Recommendations: See Admitting Provider Note  Report given to:   Additional Notes:

## 2019-04-07 NOTE — ED Provider Notes (Signed)
[facility]ER[facility]ENC[facility] DEPARTMENT Provider Note   CSN: [id number] Arrival date & time: 04/07/19  1517    History   Chief Complaint Chief Complaint  Patient presents with   Back Pain   Shortness of Breath    HPI Denise King is a 68 y.o. female.     HPI  Pt is a 68 y/o female with h/o anemia, anxiety, back pain, CAP, depression, [name], who presents to the ED today c/o sob.  States she developed a dry cough and sob last night. She later woke up in the middle of the night with nausea and vomiting. States she had some mild chest heaviness in the center of her chest that began after she had episodes of nausea and vomiting.  This pain has since resolved. Has pain with inspiration.  No abdominal pain, dysuria, hematuria or frequency.  No bloody stools or hematemesis.  States she has had diarrhea for the last 6 to 8 weeks that she attributes to her [name].  She also complains of feeling weak and fatigued.  Is also complaining of back pain near the left flank and bilateral lower posterior ribs.  Reports history of tobacco use.  Smokes slightly less than a pack per day for last 40 years.  No known history of COPD.  No known sick contacts or known COVID exposures.  No recent foreign travel.  No recent admissions to the hospital or surgeries.  No lower extremity swelling or calf pain.  No hemoptysis.  No history of VTE or cancer.  Past Medical History:  Diagnosis Date   Acute cystitis    Acute respiratory failure ([place])    Allergy    as a child grew out of them   Anemia    pernicious anemia   Anxiety    Arthritis    Back pain    Blood transfusion    CAP (community acquired pneumonia)    Chronic pain syndrome    Depression    Diverticulosis of colon    Dysthymia    Esophagitis    EtOH dependence ([place])    Gastritis    GERD (gastroesophageal reflux disease)    H/O chest pain Dec. 2013   no work up done   Hx of colonic polyps     Hypertension    Irritable bowel syndrome    Osteopenia    Osteoporosis    Pneumonia 2019   Polypharmacy    Reflux esophagitis    Right sided sciatica 12/22/2017   Tobacco use disorder    Unspecified chronic bronchitis (HCC)    Vertigo    Vitamin B12 deficiency    Wears glasses     Patient Active Problem List   Diagnosis Date Noted   SOB (shortness of breath) 04/07/2019   Hip fracture ([place]) 10/01/2018   Dysuria 05/20/2018   Right leg swelling 05/20/2018   CAP (community acquired pneumonia) 05/13/2018   Tachycardia 05/13/2018   Chest wall pain 05/13/2018   Narcotic poisoning ([place]) 05/13/2018   EtOH dependence ([place])    Polypharmacy    Esophagitis    Preventative health care 12/22/2017   Erythrocytosis 12/22/2017   Depression 12/22/2017   Hyperglycemia 12/22/2017   Right sided sciatica 12/22/2017   Nausea & vomiting 07/17/2017   Cough 06/10/2017   Diarrhea 04/22/2016   Family history of colon cancer - brother and father 03/10/2016   Insomnia 02/11/2016   Generalized anxiety disorder 11/25/2015   Urinary frequency 04/25/2015   Nausea and vomiting in  adult 11/08/2014   Cigarette smoker 08/15/2013   Otitis media 10/27/2012   Abdominal pain, generalized 03/30/2012   HEPATIC CYST 02/18/2010   Chronic pain syndrome 02/08/2010   Vitamin B12 deficiency 10/04/2009   GERD 01/04/2009   ABDOMINAL PAIN-EPIGASTRIC 01/04/2009   DYSTHYMIA 08/10/2008   Venous (peripheral) insufficiency 08/10/2008   Irritable bowel syndrome 08/10/2008   Back pain 08/10/2008   CIGARETTE SMOKER 03/06/2008   Hx of adenomatous polyp of colon 12/06/2007   BRONCHITIS, RECURRENT 12/06/2007   DIVERTICULOSIS OF COLON 12/06/2007   OSTEOPENIA 12/06/2007   CALCULUS, KIDNEY 10/07/2007   VERTIGO 10/07/2007   Headache(784.0) 10/07/2007    Past Surgical History:  Procedure Laterality Date   APPENDECTOMY     BACK SURGERY  10-09   Dr. Ricke Hey TUNNEL RELEASE Right 08/14/2014   Procedure: RIGHT CARPAL TUNNEL RELEASE AND INJECT LEFT THUMB;  Surgeon: Daryll Brod, MD;  Location: [facility];  Service: Orthopedics;  Laterality: Right;   COLONOSCOPY     INTRAMEDULLARY (IM) NAIL INTERTROCHANTERIC Right 10/01/2018   Procedure: INTRAMEDULLARY (IM) NAIL INTERTROCHANTRIC;  Surgeon: Thornton Park, MD;  Location: [facility];  Service: Orthopedics;  Laterality: Right;   LAPAROSCOPY N/A 02/21/2013   Procedure: LAPAROSCOPY OPERATIVE;  Surgeon: Margarette Asal, MD;  Location: [facility];  Service: Gynecology;  Laterality: N/A;  REQUESTING 5MM SCOPE WITH CAMERA   TONSILLECTOMY     TUBAL LIGATION     UPPER GASTROINTESTINAL ENDOSCOPY       OB History   No obstetric history on file.      Home Medications    Prior to Admission medications   Medication Sig Start Date End Date Taking? Authorizing Provider  ARIPiprazole (ABILIFY) 2 MG tablet Take 1 tablet (2 mg total) by mouth daily for 30 days. 03/21/19 04/20/19 Yes Pucilowski, Marchia Bond, MD  cholestyramine (QUESTRAN) 4 GM/DOSE powder Take 1 packet (4 g total) by mouth 2 (two) times daily with a meal. Patient taking differently: Take 4 g by mouth 2 (two) times daily as needed (diarrhea).  03/24/19  Yes Gatha Mayer, MD  clonazePAM (KLONOPIN) 1 MG tablet Take 1 tablet (1 mg total) by mouth at bedtime. Patient taking differently: Take 1 mg by mouth daily as needed for anxiety.  02/07/19 05/08/19 Yes Pucilowski, Olgierd A, MD  folic acid (FOLVITE) 1 MG tablet Take 1 tablet (1 mg total) by mouth daily. 09/05/18  Yes Biagio Borg, MD  hyoscyamine (ANASPAZ) 0.125 MG TBDP disintergrating tablet Place 0.125 mg under the tongue every 6 (six) hours as needed ([name] cramps).    Yes [provider]  morphine (MS CONTIN) 30 MG 12 hr tablet Take 30 mg by mouth every 8 (eight) hours.   Yes [provider]  Oxycodone HCl 10 MG TABS Take 10 mg by mouth every 4 (four) hours.  03/06/19   Yes [provider]  sertraline (ZOLOFT) 100 MG tablet Take 1.5 tablets (150 mg total) by mouth daily. Patient taking differently: Take 150 mg by mouth at bedtime.  03/21/19 06/19/19 Yes Pucilowski, Olgierd A, MD  triamcinolone cream (KENALOG) 0.1 % Apply 1 application topically 2 (two) times daily as needed (itching).  03/14/19  Yes [provider]  vitamin B-12 (CYANOCOBALAMIN) 100 MCG tablet Take 1 tablet (100 mcg total) by mouth daily. 11/03/18  Yes Biagio Borg, MD  denosumab (PROLIA) 60 MG/ML SOSY injection Inject 60 mg into the skin every 6 (six) months.    [provider]  Family History Family History  Problem Relation Age of Onset   Colon cancer Father    Prostate cancer Father    Heart disease Mother        prev MVR, also has DJD   Colon cancer Brother    Skin cancer Sister    Alcohol abuse Daughter    Bipolar disorder Daughter    Drug abuse Daughter    Esophageal cancer Neg Hx    Rectal cancer Neg Hx    Stomach cancer Neg Hx     Social History Social History   Tobacco Use   Smoking status: Current Every Day Smoker    Packs/day: 1.00    Years: 30.00    Pack years: 30.00    Types: Cigarettes   Smokeless tobacco: Never Used  Substance Use Topics   Alcohol use: Yes    Alcohol/week: 3.0 standard drinks    Types: 3 Standard drinks or equivalent per week    Comment: occasional   Drug use: No     Allergies   Atorvastatin; Levofloxacin; Omnicef [cefdinir]; Trazodone and nefazodone; Sulfa antibiotics; and Sulfonamide derivatives   Review of Systems Review of Systems  Constitutional: Positive for chills, diaphoresis, fatigue and fever.  HENT: Negative for congestion, rhinorrhea and sore throat.   Eyes: Negative for visual disturbance.  Respiratory: Positive for cough and shortness of breath. Negative for wheezing.   Cardiovascular: Positive for chest pain (heaviness). Negative for palpitations and leg swelling.    Gastrointestinal: Positive for diarrhea, nausea and vomiting. Negative for abdominal pain and constipation.  Genitourinary: Negative for dysuria, flank pain, hematuria and urgency.  Musculoskeletal: Positive for back pain. Negative for myalgias.  Skin: Negative for rash.  Neurological: Negative for headaches.     Physical Exam Updated Vital Signs BP 95/68    Pulse 91    Temp 100.2 F (37.9 C) (Oral)    Resp 18    Ht 5\' 3"  (1.6 m)    Wt 53.1 kg    SpO2 97%    BMI 20.73 kg/m   Physical Exam Vitals signs and nursing note reviewed.  Constitutional:      General: She is not in acute distress.    Appearance: She is well-developed. She is not ill-appearing.  HENT:     Head: Normocephalic and atraumatic.     Nose: Nose normal.     Mouth/Throat:     Mouth: Mucous membranes are dry.     Pharynx: No oropharyngeal exudate or posterior oropharyngeal erythema.  Eyes:     Conjunctiva/sclera: Conjunctivae normal.  Neck:     Musculoskeletal: Neck supple.  Cardiovascular:     Rate and Rhythm: Normal rate and regular rhythm.     Heart sounds: No murmur.  Pulmonary:     Effort: Pulmonary effort is normal. No tachypnea, bradypnea or respiratory distress.     Breath sounds: Normal breath sounds. No decreased breath sounds, wheezing or rhonchi.  Abdominal:     General: Bowel sounds are normal.     Palpations: Abdomen is soft.     Tenderness: There is left CVA tenderness. There is no right CVA tenderness, guarding or rebound.     Comments: Minimal left lower quadrant tenderness (chronic and unchanged per patient)  Musculoskeletal: Normal range of motion.        General: No tenderness.     Right lower leg: No edema.     Left lower leg: No edema.  Skin:    General: Skin is warm and dry.  Neurological:     Mental Status: She is alert and oriented to person, place, and time.  Psychiatric:        Mood and Affect: Mood normal.      ED Treatments / Results  Labs (all labs ordered are  listed, but only abnormal results are displayed) Labs Reviewed  CBC WITH DIFFERENTIAL/PLATELET - Abnormal; Notable for the following components:      Result Value   WBC 10.7 (*)    MCV 102.7 (*)    Monocytes Absolute 1.2 (*)    All other components within normal limits  COMPREHENSIVE METABOLIC PANEL - Abnormal; Notable for the following components:   Chloride 93 (*)    Glucose, Bld 114 (*)    BUN 5 (*)    GFR calc non Af Amer 59 (*)    All other components within normal limits  URINALYSIS, ROUTINE W REFLEX MICROSCOPIC - Abnormal; Notable for the following components:   APPearance HAZY (*)    All other components within normal limits  TROPONIN I - Abnormal; Notable for the following components:   Troponin I 1.07 (*)    All other components within normal limits  D-DIMER, QUANTITATIVE (NOT AT [facility] - Abnormal; Notable for the following components:   D-Dimer, Quant 0.84 (*)    All other components within normal limits  TROPONIN I - Abnormal; Notable for the following components:   Troponin I 1.11 (*)    All other components within normal limits  CULTURE, BLOOD (ROUTINE X 2)  CULTURE, BLOOD (ROUTINE X 2)  NOVEL CORONAVIRUS, NAA (HOSPITAL ORDER, SEND-OUT TO REF LAB)  LIPASE, BLOOD  LACTIC ACID, PLASMA  HEPARIN LEVEL (UNFRACTIONATED)  CBC  BRAIN NATRIURETIC PEPTIDE  SEDIMENTATION RATE  C-REACTIVE PROTEIN    EKG EKG Interpretation  Date/Time:  Friday April 07 2019 18:33:00 EDT Ventricular Rate:  92 PR Interval:    QRS Duration: 97 QT Interval:  397 QTC Calculation: 492 R Axis:   87 Text Interpretation:  Sinus rhythm Borderline right axis deviation Borderline prolonged QT interval No STEMI.  Confirmed by Nanda Quinton [phone]) on 04/07/2019 6:44:55 PM   Radiology Ct Angio Chest Pe W And/or Wo Contrast  Result Date: 04/07/2019 CLINICAL DATA:  Shortness of breath. Cough. Fever. PE suspected, intermediate prob, neg D-dimer EXAM: CT ANGIOGRAPHY CHEST WITH CONTRAST TECHNIQUE:  Multidetector CT imaging of the chest was performed using the standard protocol during bolus administration of intravenous contrast. Multiplanar CT image reconstructions and MIPs were obtained to evaluate the vascular anatomy. CONTRAST:  19mL OMNIPAQUE IOHEXOL 350 MG/ML SOLN COMPARISON:  Chest radiograph earlier this day. Chest CT 05/13/2018 FINDINGS: Cardiovascular: There are no filling defects within the pulmonary arteries to suggest pulmonary embolus. Atherosclerosis of the thoracic aorta without aneurysm or dissection. No evidence of acute aortic syndrome. Coronary artery calcifications. Heart is normal in size. No pericardial effusion. Mediastinum/Nodes: Small mediastinal nodes are not enlarged by size criteria. No hilar adenopathy. Improved esophageal wall thickening from prior. No dominant thyroid nodule. Lungs/Pleura: Dependent linear opacities may be combination of atelectasis and scarring. Mild emphysema. Pulmonary edema or acute airspace disease. No pleural fluid. Trachea and mainstem bronchi are patent. Upper Abdomen: No acute findings. Musculoskeletal: Remote lower sternal fracture. There are no acute or suspicious osseous abnormalities. Review of the MIP images confirms the above findings. IMPRESSION: 1. No pulmonary embolus. Bibasilar atelectasis or scarring. No acute intrathoracic abnormality. 2. Coronary artery calcifications. Aortic Atherosclerosis (ICD10-I70.0). Electronically Signed   By: Keith Rake M.D.   On:  04/07/2019 19:24   Dg Chest Portable 1 View  Result Date: 04/07/2019 CLINICAL DATA:  Patient reports shortness of breath, non-productive cough, fever onset yesterday and over the night. EXAM: PORTABLE CHEST 1 VIEW COMPARISON:  Chest x-ray dated 10/01/2018 FINDINGS: Heart size and mediastinal contours are within normal limits. Lungs are clear. No pleural effusion or pneumothorax seen. Osseous structures about the chest are unremarkable. IMPRESSION: No active disease. No evidence  of pneumonia or pulmonary edema. Electronically Signed   By: Franki Cabot M.D.   On: 04/07/2019 17:53    Procedures Procedures (including critical care time) CRITICAL CARE Performed by: Rodney Booze   Total critical care time: 37 minutes  Critical care time was exclusive of separately billable procedures and treating other patients.  Critical care was necessary to treat or prevent imminent or life-threatening deterioration.  Critical care was time spent personally by me on the following activities: development of treatment plan with patient and/or surrogate as well as nursing, discussions with consultants, evaluation of patient's response to treatment, examination of patient, obtaining history from patient or surrogate, ordering and performing treatments and interventions, ordering and review of laboratory studies, ordering and review of radiographic studies, pulse oximetry and re-evaluation of patient's condition.   Medications Ordered in ED Medications  heparin ADULT infusion 100 units/mL (25000 units/292mL sodium chloride 0.45%) (600 Units/hr Intravenous New Bag/Given 04/07/19 1932)  Ipratropium-Albuterol (COMBIVENT) respimat 1 puff (has no administration in time range)  Ipratropium-Albuterol (COMBIVENT) respimat 1 puff (has no administration in time range)  budesonide (PULMICORT) 180 MCG/ACT inhaler 1 puff (has no administration in time range)  potassium chloride SA (K-DUR,KLOR-CON) CR tablet 40 mEq (has no administration in time range)  albuterol (PROVENTIL HFA;VENTOLIN HFA) 108 (90 Base) MCG/ACT inhaler 4 puff (4 puffs Inhalation Given 04/07/19 1658)  sodium chloride 0.9 % bolus 1,000 mL (0 mLs Intravenous Stopped 04/07/19 1820)  acetaminophen (TYLENOL) tablet 650 mg (650 mg Oral Given 04/07/19 1657)  aspirin chewable tablet 324 mg (324 mg Oral Given 04/07/19 1824)  cefTRIAXone (ROCEPHIN) 1 g in sodium chloride 0.9 % 100 mL IVPB (0 g Intravenous Stopped 04/07/19 2000)  azithromycin  (ZITHROMAX) 500 mg in sodium chloride 0.9 % 250 mL IVPB (0 mg Intravenous Stopped 04/07/19 2147)  heparin bolus via infusion 2,500 Units (2,500 Units Intravenous Bolus from Bag 04/07/19 1932)  iohexol (OMNIPAQUE) 350 MG/ML injection 100 mL (100 mLs Intravenous Contrast Given 04/07/19 1850)  sodium chloride 0.9 % bolus 500 mL (0 mLs Intravenous Stopped 04/07/19 2148)     Initial Impression / Assessment and Plan / ED Course  I have reviewed the triage vital signs and the nursing notes.  Pertinent labs & imaging results that were available during my care of the patient were reviewed by me and considered in my medical decision making (see chart for details).   Final Clinical Impressions(s) / ED Diagnoses   Final diagnoses:  Acute respiratory failure with hypoxia (HCC)  Troponin level elevated   Patient presenting with cough, fever, shortness of breath starting last night.  Also with chest heaviness that began last night but has since resolved.  On arrival she is borderline febrile, marginally tachycardic and hypotensive with blood pressure of 90 systolic.  Also hypoxic 87% on room air, placed on 2 L nasal cannula and sats improved.  CBC reveals a mild leukocytosis.  No anemia.  CMP is grossly unremarkable.  Lipase is negative.  D-dimer is marginally elevated.  Troponin is elevated above 1.  Lactic acid is negative.  Blood cultures were obtained.  Urinalysis is negative.  Portable chest was obtained and does not show any evidence of pneumonia.  EKG is unremarkable.  Given elevated d-dimer, CTA obtained to rule out pulmonary embolus given pleuritic chest pain and hypoxia.  CTA did not show any evidence of PE or other abnormality.  Given clinical concern for upper respiratory infection, patient treated with ceftriaxone and azithromycin for suspected community-acquired pneumonia however this may be viral infection especially in the setting of recent COVID pandemic.  I do feel this patient does require  COVID testing.  Given patient's elevated troponin at 1.07, aspirin was given.  In setting of chest pressure with associated nausea and vomiting heparin drip was initiated.  This is an unclear picture for ACS as she does not have any EKG changes she does not have any current chest pain.  She will require trending of her troponins.  Patient seen in conjunction with Dr. Ralene Bathe who personally evaluated the patient and provided guidance in terms of patient's work-up and management.  7:49 PM discussed case with Dr. Johnsie Cancel with cardiology who advised admission to hospitalist service.  Advised to trend troponins and follow EKGs for changes.  Advised to continue heparin at this time.  Does agree with plan to work-up patient for COVID.  He will see patient in the ED and provide recommendations.  8:07 PM Discussed case with Dr. Marthenia Rolling who recommended stat troponin and to repage him.  Discussed case with Dr. Marthenia Rolling who is aware of troponin and will admit the patient for further treatment.   Denise King was evaluated in Emergency Department on 04/08/2019 for the symptoms described in the history of present illness. She was evaluated in the context of the global COVID-19 pandemic, which necessitated consideration that the patient might be at risk for infection with the SARS-CoV-2 virus that causes COVID-19. Institutional protocols and algorithms that pertain to the evaluation of patients at risk for COVID-19 are in a state of rapid change based on information released by regulatory bodies including the CDC and federal and state organizations. These policies and algorithms were followed during the patient's care in the ED.   ED Discharge Orders    None       Bishop Dublin 04/08/19 Bradd Burner, MD 04/08/19 1122

## 2019-04-07 NOTE — Telephone Encounter (Signed)
Pt daughter called for different location to say her mother had symptoms of the COVID-19 . She states that her mother is at home and able to speak with me.  Call placed to patient. Left VM to return call to office  Returned call and this time the patient answered and stated that she has already spoke to someone.

## 2019-04-07 NOTE — H&P (Signed)
History and Physical  Denise King QJJ:[id number] DOB: 11/02/1951 DOA: 04/07/2019  Referring physician: ER physician PCP: Biagio Borg, MD  Outpatient Specialists:    Patient coming from: Home  Chief Complaint: Shortness of breath and fever.  HPI: Patient is a 68 year old female with past medical history significant for irritable bowel syndrome, tobacco use disorder (over 40 pack years), chronic bronchitis, alcohol dependence, chronic pain amongst other medical problems.  Patient presents with 6-week history of diarrhea.  Patient reports daily bowel movement that is up to 8 times daily, and said to be watery.  Last diarrhea stool was 2 days ago.  According to the patient, she has not had appetite, has not eaten much in the last few days and has been feeling generally unwell.  She developed nausea and vomiting in the last 24 hours, as well as fever, chills, shortness of breath, sore throat and some URI symptoms.  Patient developed chest pressure as well, said to be constant, and only got relief to him patient was put on oxygen on presentation to the hospital.  Currently, patient is chest pain-free.  First troponin is greater than 1.  EKG is not very revealing.  No headache, no neck pain, no urinary symptoms.  CTA of the chest is negative for pulmonary embolism, however, it revealed bibasilar atelectasis.  Patient will be admitted for further assessment and management.  ED Course: Patient is currently on heparin drip.  Cardiology team is been consulted due to elevated troponin.  Patient is currently on contact precaution as there is need to rule out COVID-19/coronavirus.  Pertinent labs: CBC reveals WBC of 10.7, hemoglobin of 13.4, hematocrit of 40.2, MCV of 102.7 with platelet count of 295.  Sodium is 135, potassium of 3.5, chloride of 93, CO2 29, BUN of 5 and creatinine of 1 with blood sugar of 114.  Troponin is 1.07.  Lactic acid is 1.  CTA chest was negative for pulmonary embolism, but  revealed bibasilar atelectasis  . EKG: Independently reviewed.   Imaging: independently reviewed.   Review of Systems:  Negative for fever, visual changes, sore throat, rash, new muscle aches, chest pain, dysuria, bleeding.  Past Medical History:  Diagnosis Date   Acute cystitis    Acute respiratory failure ([place])    Allergy    as a child grew out of them   Anemia    pernicious anemia   Anxiety    Arthritis    Back pain    Blood transfusion    CAP (community acquired pneumonia)    Chronic pain syndrome    Depression    Diverticulosis of colon    Dysthymia    Esophagitis    EtOH dependence ([place])    Gastritis    GERD (gastroesophageal reflux disease)    H/O chest pain Dec. 2013   no work up done   Hx of colonic polyps    Hypertension    Irritable bowel syndrome    Osteopenia    Osteoporosis    Pneumonia 2019   Polypharmacy    Reflux esophagitis    Right sided sciatica 12/22/2017   Tobacco use disorder    Unspecified chronic bronchitis (HCC)    Vertigo    Vitamin B12 deficiency    Wears glasses     Past Surgical History:  Procedure Laterality Date   APPENDECTOMY     BACK SURGERY  10-09   Dr. Patrice Paradise   CARPAL TUNNEL RELEASE Right 08/14/2014   Procedure: RIGHT CARPAL TUNNEL RELEASE  AND INJECT LEFT THUMB;  Surgeon: Daryll Brod, MD;  Location: [facility];  Service: Orthopedics;  Laterality: Right;   COLONOSCOPY     INTRAMEDULLARY (IM) NAIL INTERTROCHANTERIC Right 10/01/2018   Procedure: INTRAMEDULLARY (IM) NAIL INTERTROCHANTRIC;  Surgeon: Thornton Park, MD;  Location: [facility];  Service: Orthopedics;  Laterality: Right;   LAPAROSCOPY N/A 02/21/2013   Procedure: LAPAROSCOPY OPERATIVE;  Surgeon: Margarette Asal, MD;  Location: [facility];  Service: Gynecology;  Laterality: N/A;  REQUESTING 5MM SCOPE WITH CAMERA   TONSILLECTOMY     TUBAL LIGATION     UPPER GASTROINTESTINAL ENDOSCOPY       reports that she has  been smoking cigarettes. She has a 30.00 pack-year smoking history. She has never used smokeless tobacco. She reports current alcohol use of about 3.0 standard drinks of alcohol per week. She reports that she does not use drugs.  Allergies  Allergen Reactions   Atorvastatin Nausea And Vomiting   Levofloxacin Other (See Comments)    Reaction: achilles tendon pain   Omnicef [Cefdinir] Diarrhea    Uncontrollable diarrhea   Trazodone And Nefazodone Other (See Comments)    "Felt like I was going to faint"   Sulfa Antibiotics Rash   Sulfonamide Derivatives Rash    Family History  Problem Relation Age of Onset   Colon cancer Father    Prostate cancer Father    Heart disease Mother        prev MVR, also has DJD   Colon cancer Brother    Skin cancer Sister    Alcohol abuse Daughter    Bipolar disorder Daughter    Drug abuse Daughter    Esophageal cancer Neg Hx    Rectal cancer Neg Hx    Stomach cancer Neg Hx      Prior to Admission medications   Medication Sig Start Date End Date Taking? Authorizing Provider  acetaminophen (TYLENOL) 325 MG tablet Take 1-2 tablets (325-650 mg total) by mouth every 6 (six) hours as needed for mild pain (pain score 1-3 or temp > 100.5). Patient not taking: Reported on 03/21/2019 10/05/18   Dustin Flock, MD  ARIPiprazole (ABILIFY) 2 MG tablet Take 1 tablet (2 mg total) by mouth daily for 30 days. Patient not taking: Reported on 03/21/2019 03/21/19 04/20/19  Pucilowski, Marchia Bond, MD  cholestyramine Lucrezia Starch) 4 GM/DOSE powder Take 1 packet (4 g total) by mouth 2 (two) times daily with a meal. 03/24/19   Gatha Mayer, MD  clonazePAM (KLONOPIN) 1 MG tablet Take 1 tablet (1 mg total) by mouth at bedtime. 02/07/19 05/08/19  Pucilowski, Marchia Bond, MD  diphenoxylate-atropine (LOMOTIL) 2.5-0.025 MG tablet Take 1 tablet by mouth 4 (four) times daily as needed for diarrhea or loose stools. Patient not taking: Reported on 04/07/2019 08/15/18   Gatha Mayer, MD  docusate sodium (COLACE) 100 MG capsule Take 1 capsule (100 mg total) by mouth 2 (two) times daily. Patient not taking: Reported on 03/21/2019 10/05/18   Dustin Flock, MD  fentaNYL (DURAGESIC) 25 MCG/HR Place 1 patch onto the skin every other day. 03/06/19   [provider]  folic acid (FOLVITE) 1 MG tablet Take 1 tablet (1 mg total) by mouth daily. Patient not taking: Reported on 03/21/2019 09/05/18   Biagio Borg, MD  hyoscyamine (ANASPAZ) 0.125 MG TBDP disintergrating tablet Place 0.125 mg under the tongue every 6 (six) hours as needed.    [provider]  Oxycodone HCl 10 MG TABS Take 10 mg  by mouth every 4 (four) hours as needed. 03/06/19   [provider]  pantoprazole (PROTONIX) 40 MG tablet TAKE 1 TABLET 30 MINUTES twice per day Patient not taking: Reported on 01/10/2019 06/24/18   Biagio Borg, MD  polyethylene glycol [facility] / Floria Raveling) packet Take 17 g by mouth daily as needed for mild constipation. 10/05/18   Dustin Flock, MD  senna (SENOKOT) 8.6 MG TABS tablet Take 1 tablet by mouth 2 (two) times daily as needed for mild constipation.    [provider]  sertraline (ZOLOFT) 100 MG tablet Take 1.5 tablets (150 mg total) by mouth daily. 03/21/19 06/19/19  Pucilowski, Marchia Bond, MD  triamcinolone cream (KENALOG) 0.1 % Apply 1 application topically 2 (two) times daily as needed. 03/14/19   [provider]  vitamin B-12 (CYANOCOBALAMIN) 100 MCG tablet Take 1 tablet (100 mcg total) by mouth daily. 11/03/18   Biagio Borg, MD    Physical Exam: Vitals:   04/07/19 1945 04/07/19 2015 04/07/19 2030 04/07/19 2045  BP: 1'14/62 97/70 93/62 ' (!) 93/57  Pulse: (!) 103 94 82 86  Resp: 18     Temp:      TempSrc:      SpO2:  99% 100% 100%  Weight:      Height:        Constitutional:   Appears calm and comfortable.  Thin lady. Eyes:   Mild pallor. No jaundice.  ENMT:   external ears, nose appear normal Neck:   Neck is supple. No  JVD Respiratory:   Decreased air entry with bibasilar mild crackles posteriorly  Cardiovascular:   S1S2  No LE extremity edema   Abdomen:   Abdomen is soft and non tender. Organs are difficult to assess. Neurologic:   Awake and alert.  Moves all limbs.  Wt Readings from Last 3 Encounters:  04/07/19 53.1 kg  11/03/18 55.3 kg  10/19/18 54 kg    I have personally reviewed following labs and imaging studies  Labs on Admission:  CBC: Recent Labs  Lab 04/07/19 1700  WBC 10.7*  NEUTROABS 6.1  HGB 13.4  HCT 42.2  MCV 102.7*  PLT 628   Basic Metabolic Panel: Recent Labs  Lab 04/07/19 1700  NA 135  K 3.5  CL 93*  CO2 29  GLUCOSE 114*  BUN 5*  CREATININE 1.00  CALCIUM 9.1   Liver Function Tests: Recent Labs  Lab 04/07/19 1700  AST 25  ALT 11  ALKPHOS 95  BILITOT 0.6  PROT 6.7  ALBUMIN 3.9   Recent Labs  Lab 04/07/19 1700  LIPASE 25   No results for input(s): AMMONIA in the last 168 hours. Coagulation Profile: No results for input(s): INR, PROTIME in the last 168 hours. Cardiac Enzymes: Recent Labs  Lab 04/07/19 1700 04/07/19 2007  TROPONINI 1.07* 1.11*   BNP (last 3 results) No results for input(s): PROBNP in the last 8760 hours. HbA1C: No results for input(s): HGBA1C in the last 72 hours. CBG: No results for input(s): GLUCAP in the last 168 hours. Lipid Profile: No results for input(s): CHOL, HDL, LDLCALC, TRIG, CHOLHDL, LDLDIRECT in the last 72 hours. Thyroid Function Tests: No results for input(s): TSH, T4TOTAL, FREET4, T3FREE, THYROIDAB in the last 72 hours. Anemia Panel: No results for input(s): VITAMINB12, FOLATE, FERRITIN, TIBC, IRON, RETICCTPCT in the last 72 hours. Urine analysis:    Component Value Date/Time   COLORURINE YELLOW 04/07/2019 2040   APPEARANCEUR HAZY (A) 04/07/2019 2040   LABSPEC 1.030 04/07/2019  2040   PHURINE 6.0 04/07/2019 2040   GLUCOSEU NEGATIVE 04/07/2019 2040   GLUCOSEU NEGATIVE 12/22/2017 1650    HGBUR NEGATIVE 04/07/2019 2040   BILIRUBINUR NEGATIVE 04/07/2019 2040   BILIRUBINUR neg 02/07/2016 1627   KETONESUR NEGATIVE 04/07/2019 2040   PROTEINUR NEGATIVE 04/07/2019 2040   UROBILINOGEN 0.2 12/22/2017 1650   NITRITE NEGATIVE 04/07/2019 2040   LEUKOCYTESUR NEGATIVE 04/07/2019 2040   Sepsis Labs: '@LABRCNTIP' (procalcitonin:4,lacticidven:4) )No results found for this or any previous visit (from the past 240 hour(s)).    Radiological Exams on Admission: Ct Angio Chest Pe W And/or Wo Contrast  Result Date: 04/07/2019 CLINICAL DATA:  Shortness of breath. Cough. Fever. PE suspected, intermediate prob, neg D-dimer EXAM: CT ANGIOGRAPHY CHEST WITH CONTRAST TECHNIQUE: Multidetector CT imaging of the chest was performed using the standard protocol during bolus administration of intravenous contrast. Multiplanar CT image reconstructions and MIPs were obtained to evaluate the vascular anatomy. CONTRAST:  127m OMNIPAQUE IOHEXOL 350 MG/ML SOLN COMPARISON:  Chest radiograph earlier this day. Chest CT 05/13/2018 FINDINGS: Cardiovascular: There are no filling defects within the pulmonary arteries to suggest pulmonary embolus. Atherosclerosis of the thoracic aorta without aneurysm or dissection. No evidence of acute aortic syndrome. Coronary artery calcifications. Heart is normal in size. No pericardial effusion. Mediastinum/Nodes: Small mediastinal nodes are not enlarged by size criteria. No hilar adenopathy. Improved esophageal wall thickening from prior. No dominant thyroid nodule. Lungs/Pleura: Dependent linear opacities may be combination of atelectasis and scarring. Mild emphysema. Pulmonary edema or acute airspace disease. No pleural fluid. Trachea and mainstem bronchi are patent. Upper Abdomen: No acute findings. Musculoskeletal: Remote lower sternal fracture. There are no acute or suspicious osseous abnormalities. Review of the MIP images confirms the above findings. IMPRESSION: 1. No pulmonary embolus.  Bibasilar atelectasis or scarring. No acute intrathoracic abnormality. 2. Coronary artery calcifications. Aortic Atherosclerosis (ICD10-I70.0). Electronically Signed   By: MKeith RakeM.D.   On: 04/07/2019 19:24   Dg Chest Portable 1 View  Result Date: 04/07/2019 CLINICAL DATA:  Patient reports shortness of breath, non-productive cough, fever onset yesterday and over the night. EXAM: PORTABLE CHEST 1 VIEW COMPARISON:  Chest x-ray dated 10/01/2018 FINDINGS: Heart size and mediastinal contours are within normal limits. Lungs are clear. No pleural effusion or pneumothorax seen. Osseous structures about the chest are unremarkable. IMPRESSION: No active disease. No evidence of pneumonia or pulmonary edema. Electronically Signed   By: SFranki CabotM.D.   On: 04/07/2019 17:53    EKG: Independently reviewed.   Active Problems:   SOB (shortness of breath)   Assessment/Plan Acute respiratory failure: Possible etiology includes viral syndrome (including possible coronavirus) Cannot rule possible chest infection (primary bacteremia versus superimposed bacterial). Possible COPD with exacerbation May be related to cardiac etiology We will also check ESR and CRP We will start patient on nebs DuoNeb and Pulmicort  Chest pressure/elevated troponin: Chest pressure/tightness improved with supplemental oxygen. Troponin is elevated Trend troponin EKG is not particularly revealing Cardiology input is appreciated Patient is on heparin for now. Further management will depend on hospital course  Viral syndrome: Pursue respiratory viral panel Check influenza PCR Send blood for COVID-19 if above is negative (as per protocol) Contact precaution Further management will depend on hospital course.  Tobacco abuse/COPD with possible exacerbation: DuoNeb and Pulmicort Counseled to quit tobacco use   Diarrhea: Patient reported 6 weeks of diarrhea No diarrhea in the last 48 hours Patient has a history  of [name] Continue to monitor closely  Mild hypokalemia: Likely secondary to  the diarrhea Continue to monitor and replete  Volume depletion: IV fluids.  Further management will depend on hospital course.  DVT prophylaxis: Heparin drip Code Status: Full Family Communication:  Disposition Plan: Will depend on hospital course Consults called: ER provider consulted cardiology Admission status: Inpatient  Time spent: 65 minutes  Dana Allan, MD  [facility] Pager #: [phone] 7PM-7AM contact night coverage as above  04/07/2019, 9:36 PM

## 2019-04-07 NOTE — ED Triage Notes (Signed)
Patient reports shortness of breath, non-productive cough, fever onset yesterday and over the night. She also endorses lower back pain (hx chronic back pain, but this pain is new) that she is more concerned about. Resp e/u.

## 2019-04-08 ENCOUNTER — Encounter (HOSPITAL_COMMUNITY): Payer: Self-pay

## 2019-04-08 DIAGNOSIS — R079 Chest pain, unspecified: Secondary | ICD-10-CM

## 2019-04-08 DIAGNOSIS — I2584 Coronary atherosclerosis due to calcified coronary lesion: Secondary | ICD-10-CM

## 2019-04-08 DIAGNOSIS — I2510 Atherosclerotic heart disease of native coronary artery without angina pectoris: Secondary | ICD-10-CM

## 2019-04-08 DIAGNOSIS — R6889 Other general symptoms and signs: Secondary | ICD-10-CM

## 2019-04-08 DIAGNOSIS — D539 Nutritional anemia, unspecified: Secondary | ICD-10-CM

## 2019-04-08 LAB — SEDIMENTATION RATE: Sed Rate: 4 mm/hr (ref 0–22)

## 2019-04-08 LAB — RESPIRATORY PANEL BY PCR

## 2019-04-08 LAB — PHOSPHORUS: Phosphorus: 3.5 mg/dL (ref 2.5–4.6)

## 2019-04-08 LAB — CBC
HCT: 33.1 % — ABNORMAL LOW (ref 36.0–46.0)
Hemoglobin: 10.6 g/dL — ABNORMAL LOW (ref 12.0–15.0)
MCH: 32.2 pg (ref 26.0–34.0)
MCHC: 32 g/dL (ref 30.0–36.0)
MCV: 100.6 fL — ABNORMAL HIGH (ref 80.0–100.0)
Platelets: 230 10*3/uL (ref 150–400)
RBC: 3.29 MIL/uL — ABNORMAL LOW (ref 3.87–5.11)
RDW: 13.5 % (ref 11.5–15.5)
WBC: 10.7 10*3/uL — ABNORMAL HIGH (ref 4.0–10.5)
nRBC: 0 % (ref 0.0–0.2)

## 2019-04-08 LAB — PROCALCITONIN: Procalcitonin: <0.1 ng/mL

## 2019-04-08 LAB — BRAIN NATRIURETIC PEPTIDE: B Natriuretic Peptide: 449.8 pg/mL — ABNORMAL HIGH (ref 0.0–100.0)

## 2019-04-08 LAB — COMPREHENSIVE METABOLIC PANEL
ALT: 8 U/L (ref 0–44)
AST: 20 U/L (ref 15–41)
Albumin: 2.8 g/dL — ABNORMAL LOW (ref 3.5–5.0)
Alkaline Phosphatase: 66 U/L (ref 38–126)
Anion gap: 11 (ref 5–15)
BUN: 6 mg/dL — ABNORMAL LOW (ref 8–23)
CO2: 24 mmol/L (ref 22–32)
Calcium: 7.6 mg/dL — ABNORMAL LOW (ref 8.9–10.3)
Chloride: 103 mmol/L (ref 98–111)
Creatinine, Ser: 0.87 mg/dL (ref 0.44–1.00)
GFR calc Af Amer: >60 mL/min (ref >60)
GFR calc non Af Amer: >60 mL/min (ref >60)
Glucose, Bld: 96 mg/dL (ref 70–99)
Potassium: 3.4 mmol/L — ABNORMAL LOW (ref 3.5–5.1)
Sodium: 138 mmol/L (ref 135–145)
Total Bilirubin: 0.6 mg/dL (ref 0.3–1.2)
Total Protein: 5 g/dL — ABNORMAL LOW (ref 6.5–8.1)

## 2019-04-08 LAB — MRSA PCR SCREENING: MRSA by PCR: NEGATIVE

## 2019-04-08 LAB — D-DIMER, QUANTITATIVE: D-Dimer, Quant: 0.63 ug/mL-FEU — ABNORMAL HIGH (ref 0.00–0.50)

## 2019-04-08 LAB — HEPARIN LEVEL (UNFRACTIONATED)
Heparin Unfractionated: 0.23 IU/mL — ABNORMAL LOW (ref 0.30–0.70)
Heparin Unfractionated: 0.34 IU/mL (ref 0.30–0.70)
Heparin Unfractionated: 0.3 IU/mL (ref 0.30–0.70)

## 2019-04-08 LAB — C-REACTIVE PROTEIN: CRP: <0.8 mg/dL (ref <1.0)

## 2019-04-08 LAB — TSH: TSH: 0.499 u[IU]/mL (ref 0.350–4.500)

## 2019-04-08 LAB — TROPONIN I: Troponin I: 0.83 ng/mL (ref <0.03)

## 2019-04-08 LAB — FERRITIN
Ferritin: 223 ng/mL (ref 11–307)
Ferritin: 212 ng/mL (ref 11–307)

## 2019-04-08 LAB — MAGNESIUM: Magnesium: 1.9 mg/dL (ref 1.7–2.4)

## 2019-04-08 LAB — LACTATE DEHYDROGENASE: LDH: 131 U/L (ref 98–192)

## 2019-04-08 LAB — CK: Total CK: 72 U/L (ref 38–234)

## 2019-04-08 MED ORDER — CHOLESTYRAMINE 4 G PO PACK
4.0000 g | PACK | Freq: Two times a day (BID) | ORAL | Status: DC
  Administered 2019-04-08 – 2019-04-11 (×4): 4 g via ORAL
  Filled 2019-04-08 (×9): qty 1

## 2019-04-08 MED ORDER — HYOSCYAMINE SULFATE 0.125 MG SL SUBL
0.1250 mg | SUBLINGUAL_TABLET | Freq: Four times a day (QID) | SUBLINGUAL | Status: DC | PRN
  Filled 2019-04-08: qty 1

## 2019-04-08 MED ORDER — ASPIRIN 81 MG PO CHEW
81.0000 mg | CHEWABLE_TABLET | Freq: Every day | ORAL | Status: DC
  Administered 2019-04-08 – 2019-04-11 (×4): 81 mg via ORAL
  Filled 2019-04-08 (×5): qty 1

## 2019-04-08 MED ORDER — ARIPIPRAZOLE 2 MG PO TABS
2.0000 mg | ORAL_TABLET | Freq: Every day | ORAL | Status: DC
  Administered 2019-04-08 – 2019-04-11 (×4): 2 mg via ORAL
  Filled 2019-04-08 (×4): qty 1

## 2019-04-08 MED ORDER — OXYCODONE HCL 5 MG PO TABS
10.0000 mg | ORAL_TABLET | ORAL | Status: DC | PRN
  Administered 2019-04-08 – 2019-04-11 (×8): 10 mg via ORAL
  Filled 2019-04-08 (×8): qty 2

## 2019-04-08 MED ORDER — HYOSCYAMINE SULFATE 0.125 MG PO TBDP: 0.1250 mg | ORAL_TABLET | Freq: Four times a day (QID) | ORAL | Status: DC | PRN

## 2019-04-08 MED ORDER — CLONAZEPAM 1 MG PO TABS
1.0000 mg | ORAL_TABLET | Freq: Every day | ORAL | Status: DC
  Administered 2019-04-08 – 2019-04-10 (×4): 1 mg via ORAL
  Filled 2019-04-08 (×4): qty 1

## 2019-04-08 MED ORDER — SODIUM CHLORIDE 0.9 % IV BOLUS
500.0000 mL | Freq: Once | INTRAVENOUS | Status: AC
  Administered 2019-04-08: 500 mL via INTRAVENOUS

## 2019-04-08 MED ORDER — SERTRALINE HCL 50 MG PO TABS
150.0000 mg | ORAL_TABLET | Freq: Every day | ORAL | Status: DC
  Administered 2019-04-08 – 2019-04-11 (×4): 150 mg via ORAL
  Filled 2019-04-08 (×5): qty 3

## 2019-04-08 MED ORDER — VITAMIN B-12 100 MCG PO TABS
100.0000 ug | ORAL_TABLET | Freq: Every day | ORAL | Status: DC
  Administered 2019-04-08 – 2019-04-11 (×4): 100 ug via ORAL
  Filled 2019-04-08 (×4): qty 1

## 2019-04-08 MED ORDER — ORAL CARE MOUTH RINSE
15.0000 mL | Freq: Two times a day (BID) | OROMUCOSAL | Status: DC
  Administered 2019-04-09 – 2019-04-10 (×4): 15 mL via OROMUCOSAL

## 2019-04-08 MED ORDER — POTASSIUM CHLORIDE IN NACL 20-0.9 MEQ/L-% IV SOLN
INTRAVENOUS | Status: DC
  Administered 2019-04-08 – 2019-04-09 (×2): via INTRAVENOUS
  Filled 2019-04-08 (×4): qty 1000

## 2019-04-08 MED ORDER — FENTANYL 25 MCG/HR TD PT72: 1.0000 | MEDICATED_PATCH | TRANSDERMAL | Status: DC

## 2019-04-08 MED ORDER — MORPHINE SULFATE ER 30 MG PO TBCR
30.0000 mg | EXTENDED_RELEASE_TABLET | Freq: Three times a day (TID) | ORAL | Status: DC
  Administered 2019-04-08 – 2019-04-11 (×11): 30 mg via ORAL
  Filled 2019-04-08 (×11): qty 1

## 2019-04-08 MED ORDER — FOLIC ACID 1 MG PO TABS
1.0000 mg | ORAL_TABLET | Freq: Every day | ORAL | Status: DC
  Administered 2019-04-08 – 2019-04-11 (×4): 1 mg via ORAL
  Filled 2019-04-08 (×4): qty 1

## 2019-04-08 MED ORDER — POTASSIUM CHLORIDE IN NACL 20-0.9 MEQ/L-% IV SOLN
INTRAVENOUS | Status: DC
  Administered 2019-04-08: 11:00:00 via INTRAVENOUS
  Administered 2019-04-08: 1000 mL via INTRAVENOUS
  Filled 2019-04-08: qty 1000

## 2019-04-08 NOTE — Progress Notes (Signed)
ANTICOAGULATION CONSULT NOTE - Initial Consult  Pharmacy Consult:  Heparin Indication: chest pain/ACS  Allergies  Allergen Reactions  . Atorvastatin Nausea And Vomiting  . Levofloxacin Other (See Comments)    Reaction: achilles tendon pain  . Omnicef [Cefdinir] Diarrhea    Uncontrollable diarrhea  . Trazodone And Nefazodone Other (See Comments)    "Felt like I was going to faint"  . Sulfa Antibiotics Rash  . Sulfonamide Derivatives Rash    Patient Measurements: Height: 5\' 3"  (160 cm) Weight: 125 lb 14.4 oz (57.1 kg) IBW/kg (Calculated) : 52.4 Heparin Dosing Weight: 53 kg  Vital Signs: Temp: 97.9 F (36.6 C) (04/11 0851) Temp Source: Oral (04/11 0851) BP: 88/71 (04/11 0851) Pulse Rate: 83 (04/11 0851)  Labs: Recent Labs    04/07/19 1700 04/07/19 2007 04/08/19 0238 04/08/19 1111  HGB 13.4  --  10.6*  --   HCT 42.2  --  33.1*  --   PLT 295  --  230  --   HEPARINUNFRC  --   --  0.23* 0.34  CREATININE 1.00  --  0.87  --   CKTOTAL  --   --   --  72  TROPONINI 1.07* 1.11* 0.83*  --     Estimated Creatinine Clearance: 53.3 mL/min (by C-G formula based on SCr of 0.87 mg/dL).   Medical History: Past Medical History:  Diagnosis Date  . Acute cystitis   . Acute respiratory failure ([place])   . Allergy    as a child grew out of them  . Anemia    pernicious anemia  . Anxiety   . Arthritis   . Back pain   . Blood transfusion   . CAP (community acquired pneumonia)   . Chronic pain syndrome   . Depression   . Diverticulosis of colon   . Dysthymia   . Esophagitis   . EtOH dependence ([place])   . Gastritis   . GERD (gastroesophageal reflux disease)   . H/O chest pain Dec. 2013   no work up done  . Hx of colonic polyps   . Hypertension   . Irritable bowel syndrome   . Osteopenia   . Osteoporosis   . Pneumonia 2019  . Polypharmacy   . Reflux esophagitis   . Right sided sciatica 12/22/2017  . Tobacco use disorder   . Unspecified chronic bronchitis ([place])   .  Vertigo   . Vitamin B12 deficiency   . Wears glasses      Assessment: 20 YOF presented with SOB and back pain.  Found to have elevated troponin and Pharmacy consulted to initiate IV heparin for ACS.  Baseline labs reviewed.  4/11: Heparin level this afternoon therapeutic at 0.34 on 750 units/hr. No bleeding noted. CBC WNL, with hgb 13.4 to 10.6, likely dilutional. Continue at current rate.  Goal of Therapy:  Heparin level 0.3-0.7 units/ml Monitor platelets by anticoagulation protocol: Yes   Plan:  Continue heparin gtt at 750 units/hr Check 6 hr confirmatory heparin level Daily heparin level and CBC  Thank you for involving pharmacy in this patient's care.  Janae Bridgeman, PharmD PGY1 Pharmacy Resident Phone: [phone] 04/08/2019 1:06 PM

## 2019-04-08 NOTE — Plan of Care (Signed)
  Problem: Health Behavior/Discharge Planning: Goal: Ability to manage health-related needs will improve Outcome: Progressing   Problem: Clinical Measurements: Goal: Respiratory complications will improve Outcome: Progressing   

## 2019-04-08 NOTE — Progress Notes (Signed)
Patient resting comfortably, no complaints or concerns at this time. Will continue to monitor.

## 2019-04-08 NOTE — Progress Notes (Signed)
NSR on telemetry. ECG non-ischemic. Troponin elevation is mild and "flat", not consistent with true acute coronary sd. Significant coronary calcification is seen, particularly in the proximal-mid LAD artery.  COVID-19 testing pending.  No indication for urgent coronary evaluation, but risk stratification will be necessary once she recovers from the acute illness.. If COVID-19 negative, next step is an echocardiogram. If that study does not show regional wall motion abnormalities or decreased LVEF, further coronary evaluation can be delayed until we can perform outpatient coronary CT angiography (preferred) or nuclear stress testing. If the echo shows clear-cut regional function abnormalities, will plan coronary angiography. Continue IV heparin for now, but no more than 72 hours.  Sanda Klein, MD, [facility] [phone] office [phone] pager

## 2019-04-08 NOTE — Progress Notes (Signed)
ANTICOAGULATION CONSULT NOTE  Pharmacy Consult:  Heparin Indication: chest pain/ACS  Allergies  Allergen Reactions  . Atorvastatin Nausea And Vomiting  . Levofloxacin Other (See Comments)    Reaction: achilles tendon pain  . Omnicef [Cefdinir] Diarrhea    Uncontrollable diarrhea  . Trazodone And Nefazodone Other (See Comments)    "Felt like I was going to faint"  . Sulfa Antibiotics Rash  . Sulfonamide Derivatives Rash    Patient Measurements: Height: 5\' 3"  (160 cm) Weight: 125 lb 14.4 oz (57.1 kg) IBW/kg (Calculated) : 52.4 Heparin Dosing Weight: 53 kg  Vital Signs: Temp: 97.9 F (36.6 C) (04/10 2215) Temp Source: Oral (04/10 2215) BP: 102/64 (04/10 2215) Pulse Rate: 89 (04/10 2215)  Labs: Recent Labs    04/07/19 1700 04/07/19 2007 04/08/19 0238  HGB 13.4  --  10.6*  HCT 42.2  --  33.1*  PLT 295  --  230  HEPARINUNFRC  --   --  0.23*  CREATININE 1.00  --   --   TROPONINI 1.07* 1.11*  --     Estimated Creatinine Clearance: 46.4 mL/min (by C-G formula based on SCr of 1 mg/dL).  Assessment: 68 y.o. female with elevated cardiac markers for heparin  Goal of Therapy:  Heparin level 0.3-0.7 units/ml Monitor platelets by anticoagulation protocol: Yes   Plan:  Increase Heparin 750 units/hr Check heparin level in 8 hours.  Phillis Knack, PharmD, BCPS  04/08/2019, 3:14 AM

## 2019-04-08 NOTE — Progress Notes (Signed)
ANTICOAGULATION CONSULT NOTE  Pharmacy Consult:  Heparin Indication: chest pain/ACS  Allergies  Allergen Reactions  . Atorvastatin Nausea And Vomiting  . Levofloxacin Other (See Comments)    Reaction: achilles tendon pain  . Omnicef [Cefdinir] Diarrhea    Uncontrollable diarrhea  . Trazodone And Nefazodone Other (See Comments)    "Felt like I was going to faint"  . Sulfa Antibiotics Rash  . Sulfonamide Derivatives Rash    Patient Measurements: Height: 5\' 3"  (160 cm) Weight: 125 lb 14.4 oz (57.1 kg) IBW/kg (Calculated) : 52.4 Heparin Dosing Weight: 53 kg  Vital Signs: Temp: 98 F (36.7 C) (04/11 2100) Temp Source: Oral (04/11 2100) BP: 118/65 (04/11 2100) Pulse Rate: 90 (04/11 2100)  Labs: Recent Labs    04/07/19 1700 04/07/19 2007 04/08/19 0238 04/08/19 1111 04/08/19 2114  HGB 13.4  --  10.6*  --   --   HCT 42.2  --  33.1*  --   --   PLT 295  --  230  --   --   HEPARINUNFRC  --   --  0.23* 0.34 0.30  CREATININE 1.00  --  0.87  --   --   CKTOTAL  --   --   --  72  --   TROPONINI 1.07* 1.11* 0.83*  --   --     Estimated Creatinine Clearance: 53.3 mL/min (by C-G formula based on SCr of 0.87 mg/dL).   Medical History: Past Medical History:  Diagnosis Date  . Acute cystitis   . Acute respiratory failure ([place])   . Allergy    as a child grew out of them  . Anemia    pernicious anemia  . Anxiety   . Arthritis   . Back pain   . Blood transfusion   . CAP (community acquired pneumonia)   . Chronic pain syndrome   . Depression   . Diverticulosis of colon   . Dysthymia   . Esophagitis   . EtOH dependence ([place])   . Gastritis   . GERD (gastroesophageal reflux disease)   . H/O chest pain Dec. 2013   no work up done  . Hx of colonic polyps   . Hypertension   . Irritable bowel syndrome   . Osteopenia   . Osteoporosis   . Pneumonia 2019  . Polypharmacy   . Reflux esophagitis   . Right sided sciatica 12/22/2017  . Tobacco use disorder   . Unspecified  chronic bronchitis ([place])   . Vertigo   . Vitamin B12 deficiency   . Wears glasses      Assessment: 82 YOF presented with SOB and back pain.  Found to have elevated troponin and Pharmacy consulted to initiate IV heparin for ACS.    Heparin level therapeutic at 0.3 but at bottom of range on 750 units/hr. Hgb 13.4 to 10.6, likely dilutional; plt stable wnl. No bleeding or issues with infusion per discussion with RN.    Goal of Therapy:  Heparin level 0.3-0.7 units/ml Monitor platelets by anticoagulation protocol: Yes   Plan:  Increase heparin to 800 units/hr to ensure stays in range Monitor daily heparin level and CBC, s/sx bleeding  Elicia Lamp, PharmD, BCPS Please check AMION for all [place] contact numbers Clinical Pharmacist 04/08/2019 9:56 PM

## 2019-04-08 NOTE — Progress Notes (Signed)
PROGRESS NOTE    Denise King  QIH:[id number] DOB: Aug 11, 1951 DOA: 04/07/2019 PCP: Biagio Borg, MD   Brief Narrative:  HPI per Dr. Dana Allan on 04/07/2019 Patient is a 68 year old female with past medical history significant for irritable bowel syndrome, tobacco use disorder (over 40 pack years), chronic bronchitis, alcohol dependence, chronic pain amongst other medical problems.  Patient presents with 6-week history of diarrhea.  Patient reports daily bowel movement that is up to 8 times daily, and said to be watery.  Last diarrhea stool was 2 days ago.  According to the patient, she has not had appetite, has not eaten much in the last few days and has been feeling generally unwell.  She developed nausea and vomiting in the last 24 hours, as well as fever, chills, shortness of breath, sore throat and some URI symptoms.  Patient developed chest pressure as well, said to be constant, and only got relief to him patient was put on oxygen on presentation to the hospital.  Currently, patient is chest pain-free.  First troponin is greater than 1.  EKG is not very revealing.  No headache, no neck pain, no urinary symptoms.  CTA of the chest is negative for pulmonary embolism, however, it revealed bibasilar atelectasis.  Patient will be admitted for further assessment and management.  **Interim History Still complaining of some chest discomfort.  Cardiology evaluating and she is on a heparin drip and feel that her Troponin level is flat and not consistent with true Acute Coronary Symptoms. Per Dr. Sallyanne Kuster no urgent cornary evaluation and if Negative for COVID19 Disease will need ECHOCardiogram and then possible Coronary CT Angiography vs. Nuclear Stress Testing. For now will continue Heparin gtt.   Assessment & Plan:   Active Problems:   SOB (shortness of breath)  Chest pressure/Elevated Troponin r/o ACS -Chest pressure/tightness improved with supplemental oxygen. -Troponin is elevated  and trended up to 1.11 and is now trending down -C/w ASA -EKG is not particularly revealing -Cardiology input is appreciated and recommending ECHOCardiogram if she rules out for COVID -BNP was 449.8 -Patient is on heparin for now and will continue -Further management will depend on hospital course and if she rules out for COVID  Acute Viral Syndrome and URI Symptomsand Concern for COVID-19 -CTA PE showed "No pulmonary embolus. Bibasilar atelectasis or scarring. No acute intrathoracic abnormality. Coronary artery calcifications." -CXR showed "Heart size and mediastinal contours are within normal limits. Lungs are clear. No pleural effusion or pneumothorax seen. Osseous structures about the chest are unremarkable" -Empirically started on Abx with IV Azithromycin and IV Ceftriaxone which we will continue  -Respiratory Virus Panel pending -Will check Influenza Panel  -SARS-CoV-2, NAA Sent and pending  -C/w Breathing Tx as Below -Check COVID Labs and ESR was 4, D-Dimer was 0.63, PCT was <0.10, CK was 72, LDH was 131, Ferritin was 223, and CRP was <0.8 -WBC was slightly elevated and was 10.7 -Continue to Monitor Daily COVID Labs  Tobacco Abuse -Smoking Cessation Counseling Given -Will ask Patient if she would like a Nicotine Patch   COPD with Possible Exacerbation: -C/w Azithromycin 500 mg IV  -C/w Ipratropium-Albuterol Respimat 1 puff IH q6h and q6hprn -C/w Budesonide 180 mcg/ACT 1 puff IH BID  Diarrhea -Patient reported 6 weeks of diarrhea -No diarrhea in the last 48 hours -Patient has a history of [name] -C/w Cholestyramine 4 gra po BID and Hyoscyamine 0.125 mg SL q6h -Continue to monitor closely  Mild Hypokalemia -Likely secondary to the diarrhea; Was  3.5 on Admission and now 3.4 -Replete with po KCl 40 mEQ x1 -Continue to monitor and replete as necessary -Checked Mag Level and was 1.9 -Continue to Monitor and Repeat CMP in AM   Volume Depletion -Given IVF and had a a  bolus this AM; C/w IVF with 0.9 % NaCl + 20 mEW at 100 mL/hr and reduced rate to 75 mL/hr x 1 day  -Continue to Monitor Volume Status in AM  Macrocytic Anemia -Patient's Hgb/Hct went from 13.4/42.2 -> 10.6/33.1 -In the setting of IVF Resuscitation and ? Dilutional Drop -Check Anemia Panel in AM -Continue to Monitor for S/Sx of Bleeding now that patient is on Anticoagulation -Repeat CBC in AM   Anxiety and Depression -C/w Clonazeapm 1 mg po qHS and Aripiprazole 2 mg po Daily and Sertraline 150 mg po Daily   Chronic Pain Syndrome -C/w Morphine 30 mg po q8h and Oxycodone 10 mg po q4hprn  DVT prophylaxis: Anticoagulated with Heparin gtt Code Status: FULL CODE  Family Communication: No family present at bedside  Disposition Plan: Remain Inpatient for current workup and Treatment  Consultants:   Cardiology   Procedures:  None  Antimicrobials:  Anti-infectives (From admission, onward)   Start     Dose/Rate Route Frequency Ordered Stop   04/07/19 1830  cefTRIAXone (ROCEPHIN) 1 g in sodium chloride 0.9 % 100 mL IVPB     1 g 200 mL/hr over 30 Minutes Intravenous  Once 04/07/19 1821 04/07/19 2000   04/07/19 1830  azithromycin (ZITHROMAX) 500 mg in sodium chloride 0.9 % 250 mL IVPB     500 mg 250 mL/hr over 60 Minutes Intravenous  Once 04/07/19 1821 04/07/19 2147     Subjective: Seen and examined and was still having some chest discomfort.  No lightheadedness or dizziness.  States she has some shortness of breath.  No lightheadedness or dizziness and has had diarrhea recently but none now.   Objective: Vitals:   04/07/19 2145 04/07/19 2215 04/08/19 0851 04/08/19 1633  BP: 93/70 102/64 (!) 88/71 113/78  Pulse: 90 89 83 86  Resp:  _0 Temp:  97.9 F (36.6 C) 97.9 F (36.6 C) 98.4 F (36.9 C)  TempSrc:  Oral Oral Oral  SpO2: 96% 97% 93% 93%  Weight:  57.1 kg    Height:        Intake/Output Summary (Last 24 hours) at 04/08/2019 1820 Last data filed at 04/08/2019 1700  Gross per 24 hour  Intake 2430.2 ml  Output -  Net 2430.2 ml   Filed Weights   04/07/19 1532 04/07/19 2215  Weight: 53.1 kg 57.1 kg   Examination: Physical Exam:  Constitutional: WN/WD thin Caucasian female in NAD and appears calm but a little uncomfortable Eyes: Lids and conjunctivae normal, sclerae anicteric  ENMT: External Ears, Nose appear normal. Grossly normal hearing.  Neck: Appears normal, supple, no cervical masses, normal ROM, no appreciable thyromegaly; no JVD Respiratory: Diminished to auscultation bilaterally, no wheezing, rales, rhonchi or crackles. Normal respiratory effort and patient is not tachypenic. No accessory muscle use.  Cardiovascular: RRR, no murmurs / rubs / gallops. S1 and S2 auscultated.  Abdomen: Soft, non-tender, non-distended. No masses palpated. No appreciable hepatosplenomegaly. Bowel sounds positive x4.  GU: Deferred. Musculoskeletal: No clubbing / cyanosis of digits/nails. No joint deformity upper and lower extremities.   Skin: No rashes, lesions, ulcers on a limited skin evaluation. No induration; Warm and dry.  Neurologic: CN 2-12 grossly intact with no focal deficits.  Romberg sign  and cerebellar reflexes not assessed.  Psychiatric: Normal judgment and insight. Alert and oriented x 3. Slightly Anxious mood and appropriate affect.   Data Reviewed: I have personally reviewed following labs and imaging studies  CBC: Recent Labs  Lab 04/07/19 1700 04/08/19 0238  WBC 10.7* 10.7*  NEUTROABS 6.1  --   HGB 13.4 10.6*  HCT 42.2 33.1*  MCV 102.7* 100.6*  PLT 295 222   Basic Metabolic Panel: Recent Labs  Lab 04/07/19 1700 04/08/19 0238  NA 135 138  K 3.5 3.4*  CL 93* 103  CO2 29 24  GLUCOSE 114* 96  BUN 5* 6*  CREATININE 1.00 0.87  CALCIUM 9.1 7.6*  MG  --  1.9  PHOS  --  3.5   GFR: Estimated Creatinine Clearance: 53.3 mL/min (by C-G formula based on SCr of 0.87 mg/dL). Liver Function Tests: Recent Labs  Lab 04/07/19 1700  04/08/19 0238  AST 25 20  ALT 11 8  ALKPHOS 95 66  BILITOT 0.6 0.6  PROT 6.7 5.0*  ALBUMIN 3.9 2.8*   Recent Labs  Lab 04/07/19 1700  LIPASE 25   No results for input(s): AMMONIA in the last 168 hours. Coagulation Profile: No results for input(s): INR, PROTIME in the last 168 hours. Cardiac Enzymes: Recent Labs  Lab 04/07/19 1700 04/07/19 2007 04/08/19 0238 04/08/19 1111  CKTOTAL  --   --   --  72  TROPONINI 1.07* 1.11* 0.83*  --    BNP (last 3 results) No results for input(s): PROBNP in the last 8760 hours. HbA1C: No results for input(s): HGBA1C in the last 72 hours. CBG: No results for input(s): GLUCAP in the last 168 hours. Lipid Profile: No results for input(s): CHOL, HDL, LDLCALC, TRIG, CHOLHDL, LDLDIRECT in the last 72 hours. Thyroid Function Tests: Recent Labs    04/08/19 0238  TSH 0.499   Anemia Panel: Recent Labs    04/08/19 1111  FERRITIN 223   Sepsis Labs: Recent Labs  Lab 04/07/19 1700 04/08/19 1111  PROCALCITON  --  <0.10  LATICACIDVEN 1.0  --     Recent Results (from the past 240 hour(s))  Blood culture (routine x 2)     Status: None (Preliminary result)   Collection Time: 04/07/19  5:00 PM  Result Value Ref Range Status   Specimen Description BLOOD LEFT WRIST  Final   Special Requests   Final    BOTTLES DRAWN AEROBIC AND ANAEROBIC Blood Culture adequate volume   Culture   Final    NO GROWTH < 24 HOURS Performed at [facility], [place] [address], [place], [place] [address]    Report Status PENDING  Incomplete  Blood culture (routine x 2)     Status: None (Preliminary result)   Collection Time: 04/07/19  5:00 PM  Result Value Ref Range Status   Specimen Description BLOOD RIGHT ANTECUBITAL  Final   Special Requests   Final    BOTTLES DRAWN AEROBIC AND ANAEROBIC Blood Culture results may not be optimal due to an inadequate volume of blood received in culture bottles   Culture   Final    NO GROWTH < 24 HOURS Performed at [facility], [place] [address], [place], [place] [address]    Report Status PENDING  Incomplete    Radiology Studies: Ct Angio Chest Pe W And/or Wo Contrast  Result Date: 04/07/2019 CLINICAL DATA:  Shortness of breath. Cough. Fever. PE suspected, intermediate prob, neg D-dimer EXAM: CT ANGIOGRAPHY CHEST WITH  CONTRAST TECHNIQUE: Multidetector CT imaging of the chest was performed using the standard protocol during bolus administration of intravenous contrast. Multiplanar CT image reconstructions and MIPs were obtained to evaluate the vascular anatomy. CONTRAST:  116m OMNIPAQUE IOHEXOL 350 MG/ML SOLN COMPARISON:  Chest radiograph earlier this day. Chest CT 05/13/2018 FINDINGS: Cardiovascular: There are no filling defects within the pulmonary arteries to suggest pulmonary embolus. Atherosclerosis of the thoracic aorta without aneurysm or dissection. No evidence of acute aortic syndrome. Coronary artery calcifications. Heart is normal in size. No pericardial effusion. Mediastinum/Nodes: Small mediastinal nodes are not enlarged by size criteria. No hilar adenopathy. Improved esophageal wall thickening from prior. No dominant thyroid nodule. Lungs/Pleura: Dependent linear opacities may be combination of atelectasis and scarring. Mild emphysema. Pulmonary edema or acute airspace disease. No pleural fluid. Trachea and mainstem bronchi are patent. Upper Abdomen: No acute findings. Musculoskeletal: Remote lower sternal fracture. There are no acute or suspicious osseous abnormalities. Review of the MIP images confirms the above findings. IMPRESSION: 1. No pulmonary embolus. Bibasilar atelectasis or scarring. No acute intrathoracic abnormality. 2. Coronary artery calcifications. Aortic Atherosclerosis (ICD10-I70.0). Electronically Signed   By: MKeith RakeM.D.   On: 04/07/2019 19:24   Dg Chest Portable 1 View  Result Date: 04/07/2019 CLINICAL DATA:  Patient reports shortness of breath, non-productive cough, fever  onset yesterday and over the night. EXAM: PORTABLE CHEST 1 VIEW COMPARISON:  Chest x-ray dated 10/01/2018 FINDINGS: Heart size and mediastinal contours are within normal limits. Lungs are clear. No pleural effusion or pneumothorax seen. Osseous structures about the chest are unremarkable. IMPRESSION: No active disease. No evidence of pneumonia or pulmonary edema. Electronically Signed   By: SFranki CabotM.D.   On: 04/07/2019 17:53   Scheduled Meds: . ARIPiprazole  2 mg Oral Daily  . aspirin  81 mg Oral Daily  . budesonide  1 puff Inhalation BID  . cholestyramine  4 g Oral BID WC  . clonazePAM  1 mg Oral QHS  . folic acid  1 mg Oral Daily  . Ipratropium-Albuterol  1 puff Inhalation Q6H  . morphine  30 mg Oral Q8H  . sertraline  150 mg Oral Daily  . vitamin B-12  100 mcg Oral Daily   Continuous Infusions: . 0.9 % NaCl with KCl 20 mEq / L 100 mL/hr at 04/08/19 1054  . heparin 750 Units/hr (04/08/19 0500)    LOS: 1 day   OKerney Elbe DO [facility] PAGER is on AMION  If 7PM-7AM, please contact night-coverage [email or web address] Password TAtlantic Gastro [facility]icenter LLC4/10/2019, 6:20 PM

## 2019-04-09 ENCOUNTER — Inpatient Hospital Stay (HOSPITAL_COMMUNITY): Payer: Medicare HMO

## 2019-04-09 LAB — COMPREHENSIVE METABOLIC PANEL
ALT: 8 U/L (ref 0–44)
AST: 18 U/L (ref 15–41)
Albumin: 2.7 g/dL — ABNORMAL LOW (ref 3.5–5.0)
Alkaline Phosphatase: 59 U/L (ref 38–126)
Anion gap: 9 (ref 5–15)
BUN: <5 mg/dL — ABNORMAL LOW (ref 8–23)
CO2: 24 mmol/L (ref 22–32)
Calcium: 7.9 mg/dL — ABNORMAL LOW (ref 8.9–10.3)
Chloride: 107 mmol/L (ref 98–111)
Creatinine, Ser: 0.56 mg/dL (ref 0.44–1.00)
GFR calc Af Amer: >60 mL/min (ref >60)
GFR calc non Af Amer: >60 mL/min (ref >60)
Glucose, Bld: 85 mg/dL (ref 70–99)
Potassium: 3.8 mmol/L (ref 3.5–5.1)
Sodium: 140 mmol/L (ref 135–145)
Total Bilirubin: 0.4 mg/dL (ref 0.3–1.2)
Total Protein: 4.8 g/dL — ABNORMAL LOW (ref 6.5–8.1)

## 2019-04-09 LAB — CBC WITH DIFFERENTIAL/PLATELET
Abs Immature Granulocytes: 0.03 10*3/uL (ref 0.00–0.07)
Basophils Absolute: 0 10*3/uL (ref 0.0–0.1)
Basophils Relative: 0 %
Eosinophils Absolute: 0 10*3/uL (ref 0.0–0.5)
Eosinophils Relative: 1 %
HCT: 33.6 % — ABNORMAL LOW (ref 36.0–46.0)
Hemoglobin: 10.4 g/dL — ABNORMAL LOW (ref 12.0–15.0)
Immature Granulocytes: 0 %
Lymphocytes Relative: 46 %
Lymphs Abs: 4 10*3/uL (ref 0.7–4.0)
MCH: 31.9 pg (ref 26.0–34.0)
MCHC: 31 g/dL (ref 30.0–36.0)
MCV: 103.1 fL — ABNORMAL HIGH (ref 80.0–100.0)
Monocytes Absolute: 0.8 10*3/uL (ref 0.1–1.0)
Monocytes Relative: 9 %
Neutro Abs: 3.9 10*3/uL (ref 1.7–7.7)
Neutrophils Relative %: 44 %
Platelets: 224 10*3/uL (ref 150–400)
RBC: 3.26 MIL/uL — ABNORMAL LOW (ref 3.87–5.11)
RDW: 13.7 % (ref 11.5–15.5)
WBC: 8.8 10*3/uL (ref 4.0–10.5)
nRBC: 0 % (ref 0.0–0.2)

## 2019-04-09 LAB — LIPID PANEL
Cholesterol: 128 mg/dL (ref 0–200)
HDL: 47 mg/dL (ref >40)
LDL Cholesterol: 71 mg/dL (ref 0–99)
Total CHOL/HDL Ratio: 2.7 RATIO
Triglycerides: 52 mg/dL (ref <150)
VLDL: 10 mg/dL (ref 0–40)

## 2019-04-09 LAB — MAGNESIUM: Magnesium: 1.7 mg/dL (ref 1.7–2.4)

## 2019-04-09 LAB — PROCALCITONIN: Procalcitonin: <0.1 ng/mL

## 2019-04-09 LAB — HEPARIN LEVEL (UNFRACTIONATED)
Heparin Unfractionated: 0.25 IU/mL — ABNORMAL LOW (ref 0.30–0.70)
Heparin Unfractionated: 0.28 IU/mL — ABNORMAL LOW (ref 0.30–0.70)

## 2019-04-09 LAB — D-DIMER, QUANTITATIVE: D-Dimer, Quant: 0.53 ug/mL-FEU — ABNORMAL HIGH (ref 0.00–0.50)

## 2019-04-09 LAB — SEDIMENTATION RATE: Sed Rate: 7 mm/hr (ref 0–22)

## 2019-04-09 LAB — PHOSPHORUS: Phosphorus: 2.2 mg/dL — ABNORMAL LOW (ref 2.5–4.6)

## 2019-04-09 MED ORDER — K PHOS MONO-SOD PHOS DI & MONO 155-852-130 MG PO TABS
500.0000 mg | ORAL_TABLET | Freq: Two times a day (BID) | ORAL | Status: AC
  Administered 2019-04-09 – 2019-04-10 (×2): 500 mg via ORAL
  Filled 2019-04-09 (×2): qty 2

## 2019-04-09 MED ORDER — ALUM & MAG HYDROXIDE-SIMETH 200-200-20 MG/5ML PO SUSP
30.0000 mL | Freq: Four times a day (QID) | ORAL | Status: DC | PRN
  Administered 2019-04-09: 30 mL via ORAL
  Filled 2019-04-09: qty 30

## 2019-04-09 NOTE — Progress Notes (Signed)
ANTICOAGULATION CONSULT NOTE  Pharmacy Consult:  Heparin Indication: chest pain/ACS  Patient Measurements: Height: 5\' 3"  (160 cm) Weight: 125 lb 14.4 oz (57.1 kg) IBW/kg (Calculated) : 52.4 Heparin Dosing Weight: 53 kg  Vital Signs: Temp: 97.9 F (36.6 C) (04/12 0725) Temp Source: Oral (04/12 0725) BP: 120/70 (04/12 0725) Pulse Rate: 71 (04/12 0725)  Labs: Recent Labs    04/07/19 1700 04/07/19 2007  04/08/19 0238 04/08/19 1111 04/08/19 2114 04/09/19 0615  HGB 13.4  --   --  10.6*  --   --  10.4*  HCT 42.2  --   --  33.1*  --   --  33.6*  PLT 295  --   --  230  --   --  224  HEPARINUNFRC  --   --    < > 0.23* 0.34 0.30 0.25*  CREATININE 1.00  --   --  0.87  --   --  0.56  CKTOTAL  --   --   --   --  72  --   --   TROPONINI 1.07* 1.11*  --  0.83*  --   --   --    < > = values in this interval not displayed.     Assessment: 19 YOF presented with SOB and back pain.  Found to have elevated troponin and Pharmacy consulted to initiate IV heparin for ACS.    Heparin level is subtherapeutic this morning. hgb 10.4, plts wnl.  Goal of Therapy:  Heparin level 0.3-0.7 units/ml Monitor platelets by anticoagulation protocol: Yes   Plan:  -Increase heparin to 1000 units/hr -Daily HL, CBC -Check level this evening + monitor duration of heparin    Harvel Quale 04/09/2019 10:41 AM

## 2019-04-09 NOTE — Progress Notes (Signed)
PROGRESS NOTE    Denise King  JOA:[id number] DOB: 08/12/1951 DOA: 04/07/2019 PCP: Biagio Borg, MD   Brief Narrative:  HPI per Dr. Dana Allan on 04/07/2019 Patient is a 68 year old female with past medical history significant for irritable bowel syndrome, tobacco use disorder (over 40 pack years), chronic bronchitis, alcohol dependence, chronic pain amongst other medical problems.  Patient presents with 6-week history of diarrhea.  Patient reports daily bowel movement that is up to 8 times daily, and said to be watery.  Last diarrhea stool was 2 days ago.  According to the patient, she has not had appetite, has not eaten much in the last few days and has been feeling generally unwell.  She developed nausea and vomiting in the last 24 hours, as well as fever, chills, shortness of breath, sore throat and some URI symptoms.  Patient developed chest pressure as well, said to be constant, and only got relief to him patient was put on oxygen on presentation to the hospital.  Currently, patient is chest pain-free.  First troponin is greater than 1.  EKG is not very revealing.  No headache, no neck pain, no urinary symptoms.  CTA of the chest is negative for pulmonary embolism, however, it revealed bibasilar atelectasis.  Patient will be admitted for further assessment and management.  **Interim History Still complaining of some chest discomfort again today.  Cardiology evaluating and she is on a heparin drip and feel that her Troponin level is flat and not consistent with true Acute Coronary Symptoms. Per Dr. Sallyanne Kuster no urgent cornary evaluation and if Negative for COVID19 Disease will need ECHOCardiogram and then possible Coronary CT Angiography vs. Nuclear Stress Testing. For now will continue Heparin gtt and likely D/C Heparin in the AM.    Assessment & Plan:   Active Problems:   SOB (shortness of breath)  Chest pressure/Elevated Troponin r/o ACS -Chest pressure/tightness improved with  supplemental oxygen. -Troponin is elevated and trended up to 1.11 and is now trending down aqnd went to 0.83 -C/w ASA -EKG is not particularly revealing -Cardiology input is appreciated and recommending ECHOCardiogram if she rules out for COVID (Still pending) -BNP was 449.8 -Lipid Panel total cholesterol level of 28, HDL 47, LDL 71, triglycerides of 52, VLDL 10 -Patient is on heparin for now and will continue -Further management will depend on hospital course and if she rules out for COVID  Acute Viral Syndrome and URI Symptomsand Concern for COVID-19 -CTA PE showed "No pulmonary embolus. Bibasilar atelectasis or scarring. No acute intrathoracic abnormality. Coronary artery calcifications." -CXR showed "Heart size and mediastinal contours are within normal limits. Lungs are clear. No pleural effusion or pneumothorax seen. Osseous structures about the chest are unremarkable" -Empirically started on Abx with IV Azithromycin and IV Ceftriaxone which we will continue  -Respiratory Virus Panel Negative -Will check Influenza Panel (Not done yet) -SARS-CoV-2, NAA Sent and pending  -C/w Breathing Tx as Below -Initial COVID Labs and ESR was 4, D-Dimer was 0.63, PCT was <0.10, CK was 72, LDH was 131, Ferritin was 223, and CRP was <0.8 -Repeat COVID Labs showed PCT of <0.10, Ferritin of 212, ESR of 7, D-Dimer of 0.53 -WBC was slightly elevated and was 10.7 and now trended down to 8.8 -Continue to Monitor Daily COVID Labs  Tobacco Abuse -Smoking Cessation Counseling Given -Will ask Patient if she would like a Nicotine Patch   COPD with Possible Exacerbation: -C/w Azithromycin 500 mg IV for now and also has IV Ceftriaxone  -  C/w Ipratropium-Albuterol Respimat 1 puff IH q6h and q6hprn -C/w Budesonide 180 mcg/ACT 1 puff IH BID  Diarrhea -Patient reported 6 weeks of diarrhea -No diarrhea in the last 48 hours -Patient has a history of [name] -C/w Cholestyramine 4 gra po BID and Hyoscyamine 0.125  mg SL q6h -Continue to monitor closely  Mild Hypokalemia -Likely secondary to the diarrhea; Was 3.5 on Admission and replete and now 3.8 -Replete with po KCl 40 mEQ x1 yesterday  -Continue to monitor and replete as necessary -Checked Mag Level and was 1.7 -Continue to Monitor and Repeat CMP in AM   Volume Depletion -Given IVF and had a a bolus this AM; C/w IVF with 0.9 % NaCl + 20 mEW at 100 mL/hr and reduced rate to 75 mL/hr x 1 day and now stopped  -Continue to Monitor Volume Status in AM  Macrocytic Anemia -Patient's Hgb/Hct went from 13.4/42.2 -> 10.6/33.1 -> 10.4/33.6 -In the setting of IVF Resuscitation and ? Dilutional Drop -Check Anemia Panel in AM -Continue to Monitor for S/Sx of Bleeding now that patient is on Anticoagulation -Repeat CBC in AM   Anxiety and Depression -C/w Clonazeapm 1 mg po qHS and Aripiprazole 2 mg po Daily and Sertraline 150 mg po Daily   Chronic Pain Syndrome -C/w Morphine 30 mg po q8h and Oxycodone 10 mg po q4hprn  GERD/Indigestion -Started Maalox  Hypophosphatemia  -Patient's phosphorus level this morning was 2.2 -Replete with p.o. K-Phos Neutral 500 mg po BID x2 doses -Continue to Monitor and Replete as Necessary -Repeat Phos Level in AM   DVT prophylaxis: Anticoagulated with Heparin gtt Code Status: FULL CODE  Family Communication: No family present at bedside  Disposition Plan: Remain Inpatient for current workup and Treatment with Heparin gtt.   Consultants:   Cardiology   Procedures:  None  Antimicrobials:  Anti-infectives (From admission, onward)   Start     Dose/Rate Route Frequency Ordered Stop   04/07/19 1830  cefTRIAXone (ROCEPHIN) 1 g in sodium chloride 0.9 % 100 mL IVPB     1 g 200 mL/hr over 30 Minutes Intravenous  Once 04/07/19 1821 04/07/19 2000   04/07/19 1830  azithromycin (ZITHROMAX) 500 mg in sodium chloride 0.9 % 250 mL IVPB     500 mg 250 mL/hr over 60 Minutes Intravenous  Once 04/07/19 1821 04/07/19 2147      Subjective: Seen and examined and was still having some chest discomfort was worsened with coughing and deep inspiration has no real shortness of breath and is saturating well on room air.  Denied any lightheadedness or dizziness.  No other concerns or complaints at this time.   Objective: Vitals:   04/08/19 1633 04/08/19 2100 04/09/19 0507 04/09/19 0725  BP: 113/78 118/65 118/65 120/70  Pulse: 86 90 75 71  Resp: _0 Temp: 98.4 F (36.9 C) 98 F (36.7 C) 98.3 F (36.8 C) 97.9 F (36.6 C)  TempSrc: Oral Oral Oral Oral  SpO2: 93% 93% 94% 97%  Weight:      Height:        Intake/Output Summary (Last 24 hours) at 04/09/2019 1618 Last data filed at 04/09/2019 1230 Gross per 24 hour  Intake 3987.19 ml  Output -  Net 3987.19 ml   Filed Weights   04/07/19 1532 04/07/19 2215  Weight: 53.1 kg 57.1 kg   Examination: Physical Exam:  Constitutional: Well-nourished, well-developed thin Caucasian female currently no acute distress appears calm and slightly uncomfortable Eyes: There is a conjunctive  are normal.  Sclera anicteric ENMT: External ears and nose appear normal.  Grossly normal hearing Neck: Appears supple no JVD Respiratory: Slight diminished auscultation bilaterally.  No appreciable wheezing, rales, rhonchi.  No accessory muscle use and is not wearing any supplemental oxygen via nasal cannula Cardiovascular: Regular rate and rhythm.  No appreciable murmurs, rubs, gallops. Abdomen: Soft, nontender, nondistended.  Bowel sounds present GU: Deferred Musculoskeletal: No contractures.  No joint deformities in the upper lower extremities. Skin: No appreciable rashes or lesions on limited skin evaluation. Neurologic: Cranial nerves II through XII grossly intact no appreciable focal deficits.  Romberg sign and cerebellar reflexes were not assessed. Psychiatric: Normal judgment and insight.  She is awake, alert, and oriented x3.  Slightly depressed and anxious and has a  mildly flat affect.  Data Reviewed: I have personally reviewed following labs and imaging studies  CBC: Recent Labs  Lab 04/07/19 1700 04/08/19 0238 04/09/19 0615  WBC 10.7* 10.7* 8.8  NEUTROABS 6.1  --  3.9  HGB 13.4 10.6* 10.4*  HCT 42.2 33.1* 33.6*  MCV 102.7* 100.6* 103.1*  PLT 295 230 161   Basic Metabolic Panel: Recent Labs  Lab 04/07/19 1700 04/08/19 0238 04/09/19 0615  NA 135 138 140  K 3.5 3.4* 3.8  CL 93* 103 107  CO2 _0 GLUCOSE 114* 96 85  BUN 5* 6* <5*  CREATININE 1.00 0.87 0.56  CALCIUM 9.1 7.6* 7.9*  MG  --  1.9 1.7  PHOS  --  3.5 2.2*   GFR: Estimated Creatinine Clearance: 58 mL/min (by C-G formula based on SCr of 0.56 mg/dL). Liver Function Tests: Recent Labs  Lab 04/07/19 1700 04/08/19 0238 04/09/19 0615  AST _1 ALT _2 ALKPHOS 95 66 59  BILITOT 0.6 0.6 0.4  PROT 6.7 5.0* 4.8*  ALBUMIN 3.9 2.8* 2.7*   Recent Labs  Lab 04/07/19 1700  LIPASE 25   No results for input(s): AMMONIA in the last 168 hours. Coagulation Profile: No results for input(s): INR, PROTIME in the last 168 hours. Cardiac Enzymes: Recent Labs  Lab 04/07/19 1700 04/07/19 2007 04/08/19 0238 04/08/19 1111  CKTOTAL  --   --   --  72  TROPONINI 1.07* 1.11* 0.83*  --    BNP (last 3 results) No results for input(s): PROBNP in the last 8760 hours. HbA1C: No results for input(s): HGBA1C in the last 72 hours. CBG: No results for input(s): GLUCAP in the last 168 hours. Lipid Profile: Recent Labs    04/09/19 0615  CHOL 128  HDL 47  LDLCALC 71  TRIG 52  CHOLHDL 2.7   Thyroid Function Tests: Recent Labs    04/08/19 0238  TSH 0.499   Anemia Panel: Recent Labs    04/08/19 1111 04/08/19 2114  FERRITIN 223 212   Sepsis Labs: Recent Labs  Lab 04/07/19 1700 04/08/19 1111 04/09/19 0615  PROCALCITON  --  <0.10 <0.10  LATICACIDVEN 1.0  --   --     Recent Results (from the past 240 hour(s))  Blood culture (routine x 2)     Status:  None (Preliminary result)   Collection Time: 04/07/19  5:00 PM  Result Value Ref Range Status   Specimen Description BLOOD LEFT WRIST  Final   Special Requests   Final    BOTTLES DRAWN AEROBIC AND ANAEROBIC Blood Culture adequate volume   Culture   Final    NO GROWTH 2 DAYS Performed at  [facility]  Lab, [address], [place], [place] [address]    Report Status PENDING  Incomplete  Blood culture (routine x 2)     Status: None (Preliminary result)   Collection Time: 04/07/19  5:00 PM  Result Value Ref Range Status   Specimen Description BLOOD RIGHT ANTECUBITAL  Final   Special Requests   Final    BOTTLES DRAWN AEROBIC AND ANAEROBIC Blood Culture results may not be optimal due to an inadequate volume of blood received in culture bottles   Culture   Final    NO GROWTH 2 DAYS Performed at [facility], [place] [address] [place], [place] [address]    Report Status PENDING  Incomplete  Respiratory Panel by PCR     Status: None   Collection Time: 04/08/19  4:36 PM  Result Value Ref Range Status   Adenovirus NOT DETECTED NOT DETECTED Final   Coronavirus 229E NOT DETECTED NOT DETECTED Final    Comment: (NOTE) The Coronavirus on the Respiratory Panel, DOES NOT test for the novel  Coronavirus (2019 nCoV)    Coronavirus HKU1 NOT DETECTED NOT DETECTED Final   Coronavirus NL63 NOT DETECTED NOT DETECTED Final   Coronavirus OC43 NOT DETECTED NOT DETECTED Final   Metapneumovirus NOT DETECTED NOT DETECTED Final   Rhinovirus / Enterovirus NOT DETECTED NOT DETECTED Final   Influenza A NOT DETECTED NOT DETECTED Final   Influenza B NOT DETECTED NOT DETECTED Final   Parainfluenza Virus 1 NOT DETECTED NOT DETECTED Final   Parainfluenza Virus 2 NOT DETECTED NOT DETECTED Final   Parainfluenza Virus 3 NOT DETECTED NOT DETECTED Final   Parainfluenza Virus 4 NOT DETECTED NOT DETECTED Final   Respiratory Syncytial Virus NOT DETECTED NOT DETECTED Final   Bordetella pertussis NOT DETECTED NOT  DETECTED Final   Chlamydophila pneumoniae NOT DETECTED NOT DETECTED Final   Mycoplasma pneumoniae NOT DETECTED NOT DETECTED Final    Comment: Performed at [facility], [place] [address] [place], [place] [address]  MRSA PCR Screening     Status: None   Collection Time: 04/08/19  6:22 PM  Result Value Ref Range Status   MRSA by PCR NEGATIVE NEGATIVE Final    Comment:        The GeneXpert MRSA Assay (FDA approved for NASAL specimens only), is one component of a comprehensive MRSA colonization surveillance program. It is not intended to diagnose MRSA infection nor to guide or monitor treatment for MRSA infections. Performed at [facility], [place] [address], [place],  [address]     Radiology Studies: Ct Angio Chest Pe W And/or Wo Contrast  Result Date: 04/07/2019 CLINICAL DATA:  Shortness of breath. Cough. Fever. PE suspected, intermediate prob, neg D-dimer EXAM: CT ANGIOGRAPHY CHEST WITH CONTRAST TECHNIQUE: Multidetector CT imaging of the chest was performed using the standard protocol during bolus administration of intravenous contrast. Multiplanar CT image reconstructions and MIPs were obtained to evaluate the vascular anatomy. CONTRAST:  116m OMNIPAQUE IOHEXOL 350 MG/ML SOLN COMPARISON:  Chest radiograph earlier this day. Chest CT 05/13/2018 FINDINGS: Cardiovascular: There are no filling defects within the pulmonary arteries to suggest pulmonary embolus. Atherosclerosis of the thoracic aorta without aneurysm or dissection. No evidence of acute aortic syndrome. Coronary artery calcifications. Heart is normal in size. No pericardial effusion. Mediastinum/Nodes: Small mediastinal nodes are not enlarged by size criteria. No hilar adenopathy. Improved esophageal wall thickening from prior. No dominant thyroid nodule. Lungs/Pleura: Dependent linear opacities may be combination of atelectasis and scarring. Mild emphysema.  Pulmonary edema or acute airspace disease. No pleural fluid.  Trachea and mainstem bronchi are patent. Upper Abdomen: No acute findings. Musculoskeletal: Remote lower sternal fracture. There are no acute or suspicious osseous abnormalities. Review of the MIP images confirms the above findings. IMPRESSION: 1. No pulmonary embolus. Bibasilar atelectasis or scarring. No acute intrathoracic abnormality. 2. Coronary artery calcifications. Aortic Atherosclerosis (ICD10-I70.0). Electronically Signed   By: Keith Rake M.D.   On: 04/07/2019 19:24   Dg Chest Port 1 View  Result Date: 04/09/2019 CLINICAL DATA:  Short of breath EXAM: PORTABLE CHEST 1 VIEW COMPARISON:  04/07/2019 FINDINGS: Progression of mild right lower lobe airspace disease. Left lower lobe atelectasis slightly progressive. Small right effusion. Negative for heart failure. IMPRESSION: Mild progression of bibasilar airspace disease right greater than left and small right effusion. Possible pneumonia versus atelectasis. Electronically Signed   By: Franchot Gallo M.D.   On: 04/09/2019 10:53   Dg Chest Portable 1 View  Result Date: 04/07/2019 CLINICAL DATA:  Patient reports shortness of breath, non-productive cough, fever onset yesterday and over the night. EXAM: PORTABLE CHEST 1 VIEW COMPARISON:  Chest x-ray dated 10/01/2018 FINDINGS: Heart size and mediastinal contours are within normal limits. Lungs are clear. No pleural effusion or pneumothorax seen. Osseous structures about the chest are unremarkable. IMPRESSION: No active disease. No evidence of pneumonia or pulmonary edema. Electronically Signed   By: Franki Cabot M.D.   On: 04/07/2019 17:53   Scheduled Meds: . ARIPiprazole  2 mg Oral Daily  . aspirin  81 mg Oral Daily  . budesonide  1 puff Inhalation BID  . cholestyramine  4 g Oral BID WC  . clonazePAM  1 mg Oral QHS  . folic acid  1 mg Oral Daily  . Ipratropium-Albuterol  1 puff Inhalation Q6H  . mouth rinse  15 mL Mouth Rinse BID  . morphine  30 mg Oral Q8H  . sertraline  150 mg Oral Daily   . vitamin B-12  100 mcg Oral Daily   Continuous Infusions: . 0.9 % NaCl with KCl 20 mEq / L 75 mL/hr at 04/09/19 0752  . heparin 1,000 Units/hr (04/09/19 1043)    LOS: 2 days   Kerney Elbe, DO [facility] PAGER is on AMION  If 7PM-7AM, please contact night-coverage [email or web address] Password [facility] 04/09/2019, 4:18 PM

## 2019-04-09 NOTE — Progress Notes (Signed)
ANTICOAGULATION CONSULT NOTE  Pharmacy Consult:  Heparin Indication: chest pain/ACS  Patient Measurements: Height: 5\' 3"  (160 cm) Weight: 125 lb 14.4 oz (57.1 kg) IBW/kg (Calculated) : 52.4 Heparin Dosing Weight: 53 kg  Vital Signs: Temp: 97.9 F (36.6 C) (04/12 0725) Temp Source: Oral (04/12 0725) BP: 120/70 (04/12 0725) Pulse Rate: 71 (04/12 0725)  Labs: Recent Labs    04/07/19 1700 04/07/19 2007  04/08/19 0238 04/08/19 1111 04/08/19 2114 04/09/19 0615 04/09/19 1750  HGB 13.4  --   --  10.6*  --   --  10.4*  --   HCT 42.2  --   --  33.1*  --   --  33.6*  --   PLT 295  --   --  230  --   --  224  --   HEPARINUNFRC  --   --    < > 0.23* 0.34 0.30 0.25* 0.28*  CREATININE 1.00  --   --  0.87  --   --  0.56  --   CKTOTAL  --   --   --   --  72  --   --   --   TROPONINI 1.07* 1.11*  --  0.83*  --   --   --   --    < > = values in this interval not displayed.     Assessment: 52 YOF presented with SOB and back pain.  Found to have elevated troponin and Pharmacy consulted to initiate IV heparin for ACS.    Heparin level remains slightly subtherapeutic, increased to 0.28. CBC stable. No bleeding or issues with infusion reported.  Goal of Therapy:  Heparin level 0.3-0.7 units/ml Monitor platelets by anticoagulation protocol: Yes   Plan:  Increase heparin to 1100 units/hr 6h heparin level Monitor daily heparin level and CBC, s/sx bleeding F/u duration of heparin  Elicia Lamp, PharmD, BCPS Please check AMION for all [place] contact numbers Clinical Pharmacist 04/09/2019 7:25 PM

## 2019-04-09 NOTE — Progress Notes (Signed)
Patient has been on room air all night, O2 sats in mid 90s (93-94%). No SHOB or trouble breathing. Still has a dry cough.

## 2019-04-10 ENCOUNTER — Encounter (HOSPITAL_COMMUNITY): Payer: Self-pay

## 2019-04-10 DIAGNOSIS — F329 Major depressive disorder, single episode, unspecified: Secondary | ICD-10-CM

## 2019-04-10 DIAGNOSIS — F419 Anxiety disorder, unspecified: Secondary | ICD-10-CM

## 2019-04-10 DIAGNOSIS — R0989 Other specified symptoms and signs involving the circulatory and respiratory systems: Secondary | ICD-10-CM

## 2019-04-10 LAB — PHOSPHORUS: Phosphorus: 2.9 mg/dL (ref 2.5–4.6)

## 2019-04-10 LAB — CBC
HCT: 35.4 % — ABNORMAL LOW (ref 36.0–46.0)
Hemoglobin: 11.2 g/dL — ABNORMAL LOW (ref 12.0–15.0)
MCH: 31.9 pg (ref 26.0–34.0)
MCHC: 31.6 g/dL (ref 30.0–36.0)
MCV: 100.9 fL — ABNORMAL HIGH (ref 80.0–100.0)
Platelets: 225 10*3/uL (ref 150–400)
RBC: 3.51 MIL/uL — ABNORMAL LOW (ref 3.87–5.11)
RDW: 13.7 % (ref 11.5–15.5)
WBC: 9.6 10*3/uL (ref 4.0–10.5)
nRBC: 0 % (ref 0.0–0.2)

## 2019-04-10 LAB — COMPREHENSIVE METABOLIC PANEL
ALT: 8 U/L (ref 0–44)
AST: 17 U/L (ref 15–41)
Albumin: 3 g/dL — ABNORMAL LOW (ref 3.5–5.0)
Alkaline Phosphatase: 70 U/L (ref 38–126)
Anion gap: 9 (ref 5–15)
BUN: <5 mg/dL — ABNORMAL LOW (ref 8–23)
CO2: 25 mmol/L (ref 22–32)
Calcium: 8.4 mg/dL — ABNORMAL LOW (ref 8.9–10.3)
Chloride: 106 mmol/L (ref 98–111)
Creatinine, Ser: 0.6 mg/dL (ref 0.44–1.00)
GFR calc Af Amer: >60 mL/min (ref >60)
GFR calc non Af Amer: >60 mL/min (ref >60)
Glucose, Bld: 93 mg/dL (ref 70–99)
Potassium: 3.7 mmol/L (ref 3.5–5.1)
Sodium: 140 mmol/L (ref 135–145)
Total Bilirubin: 0.7 mg/dL (ref 0.3–1.2)
Total Protein: 5.5 g/dL — ABNORMAL LOW (ref 6.5–8.1)

## 2019-04-10 LAB — PROCALCITONIN: Procalcitonin: <0.1 ng/mL

## 2019-04-10 LAB — HEPARIN LEVEL (UNFRACTIONATED)
Heparin Unfractionated: 0.35 IU/mL (ref 0.30–0.70)
Heparin Unfractionated: 0.49 IU/mL (ref 0.30–0.70)

## 2019-04-10 LAB — MAGNESIUM: Magnesium: 1.8 mg/dL (ref 1.7–2.4)

## 2019-04-10 LAB — INTERLEUKIN-6, PLASMA: Interleukin-6, Plasma: 12.5 pg/mL — ABNORMAL HIGH (ref 0.0–12.2)

## 2019-04-10 LAB — D-DIMER, QUANTITATIVE (NOT AT ARMC): D-Dimer, Quant: 0.44 ug/mL-FEU (ref 0.00–0.50)

## 2019-04-10 MED ORDER — ZINC SULFATE 220 (50 ZN) MG PO CAPS
220.0000 mg | ORAL_CAPSULE | Freq: Every day | ORAL | Status: DC
  Administered 2019-04-10 – 2019-04-11 (×2): 220 mg via ORAL
  Filled 2019-04-10 (×2): qty 1

## 2019-04-10 MED ORDER — VITAMIN C 500 MG PO TABS
500.0000 mg | ORAL_TABLET | Freq: Every day | ORAL | Status: DC
  Administered 2019-04-10 – 2019-04-11 (×2): 500 mg via ORAL
  Filled 2019-04-10 (×2): qty 1

## 2019-04-10 MED ORDER — PANTOPRAZOLE SODIUM 40 MG PO TBEC
40.0000 mg | DELAYED_RELEASE_TABLET | Freq: Every day | ORAL | Status: DC
  Administered 2019-04-10 – 2019-04-11 (×2): 40 mg via ORAL
  Filled 2019-04-10 (×2): qty 1

## 2019-04-10 MED ORDER — ONDANSETRON HCL 4 MG/2ML IJ SOLN
4.0000 mg | Freq: Four times a day (QID) | INTRAMUSCULAR | Status: DC | PRN
  Administered 2019-04-10: 4 mg via INTRAVENOUS
  Filled 2019-04-10: qty 2

## 2019-04-10 MED ORDER — MORPHINE SULFATE (PF) 2 MG/ML IV SOLN
2.0000 mg | Freq: Once | INTRAVENOUS | Status: AC
  Administered 2019-04-10: 2 mg via INTRAVENOUS
  Filled 2019-04-10: qty 1

## 2019-04-10 NOTE — Progress Notes (Signed)
Incentive spirometer performed with return demonstration. Patient level during exercise: 1,000. Patient encouraged to perform the incentive spirometer exercise during commercial breaks.

## 2019-04-10 NOTE — Progress Notes (Signed)
Pt given two oxycodone for a pain rating of 7 to her back. Pt immediately vomited after swallowing two pills. Provider notified for nausea med and a dose of IV pain medication. Orders rec'd

## 2019-04-10 NOTE — Progress Notes (Signed)
ANTICOAGULATION CONSULT NOTE  Pharmacy Consult:  Heparin Indication: chest pain/ACS  Patient Measurements: Height: 5\' 3"  (160 cm) Weight: 125 lb 14.4 oz (57.1 kg) IBW/kg (Calculated) : 52.4 Heparin Dosing Weight: 53 kg  Vital Signs: Temp: 98.1 F (36.7 C) (04/12 2206) Temp Source: Oral (04/13 0000) BP: 148/93 (04/12 2206) Pulse Rate: 83 (04/13 0000)  Labs: Recent Labs    04/07/19 1700 04/07/19 2007  04/08/19 0238 04/08/19 1111  04/09/19 0615 04/09/19 1750 04/10/19 0245  HGB 13.4  --   --  10.6*  --   --  10.4*  --  11.2*  HCT 42.2  --   --  33.1*  --   --  33.6*  --  35.4*  PLT 295  --   --  230  --   --  224  --  225  HEPARINUNFRC  --   --    < > 0.23* 0.34   < > 0.25* 0.28* 0.35  CREATININE 1.00  --   --  0.87  --   --  0.56  --   --   CKTOTAL  --   --   --   --  72  --   --   --   --   TROPONINI 1.07* 1.11*  --  0.83*  --   --   --   --   --    < > = values in this interval not displayed.     Assessment: 62 YOF presented with SOB and back pain.  Found to have elevated troponin and Pharmacy consulted to initiate IV heparin for ACS.    4/13 AM update: heparin level therapeutic x 1   Goal of Therapy:  Heparin level 0.3-0.7 units/ml Monitor platelets by anticoagulation protocol: Yes   Plan:  Cont heparin at 1100 units/hr Confirmatory heparin level at [place], PharmD, [place] Pharmacist Phone: [phone]

## 2019-04-10 NOTE — Progress Notes (Addendum)
PROGRESS NOTE    MIREILLE LACOMBE  WJX:[id number] DOB: 1951-01-11 DOA: 04/07/2019 PCP: Biagio Borg, MD   Brief Narrative:  HPI per Dr. Dana Allan on 04/07/2019 Patient is a 68 year old female with past medical history significant for irritable bowel syndrome, tobacco use disorder (over 40 pack years), chronic bronchitis, alcohol dependence, chronic pain amongst other medical problems.  Patient presents with 6-week history of diarrhea.  Patient reports daily bowel movement that is up to 8 times daily, and said to be watery.  Last diarrhea stool was 2 days ago.  According to the patient, she has not had appetite, has not eaten much in the last few days and has been feeling generally unwell.  She developed nausea and vomiting in the last 24 hours, as well as fever, chills, shortness of breath, sore throat and some URI symptoms.  Patient developed chest pressure as well, said to be constant, and only got relief to him patient was put on oxygen on presentation to the hospital.  Currently, patient is chest pain-free.  First troponin is greater than 1.  EKG is not very revealing.  No headache, no neck pain, no urinary symptoms.  CTA of the chest is negative for pulmonary embolism, however, it revealed bibasilar atelectasis.  Patient will be admitted for further assessment and management.  **Interim History Still complaining of some chest discomfort again today.  Cardiology evaluating and she is on a heparin drip and feel that her Troponin level is flat and not consistent with true Acute Coronary Symptoms. Per Dr. Sallyanne Kuster no urgent cornary evaluation and if Negative for COVID19 Disease will need ECHOCardiogram and then possible Coronary CT Angiography vs. Nuclear Stress Testing. For now will continue Heparin gtt and likely D/C Heparin later this evening (66 Hours). Further Care to be delineated once COVID Testing results and if ECHOCardiogram can be done.   Assessment & Plan:   Active Problems:  SOB (shortness of breath)  Chest pressure/Elevated Troponin r/o ACS -Chest pressure/tightness improved with supplemental oxygen. -Troponin is elevated and trended up to 1.11 and is now trending down aqnd went to 0.83 -C/w ASA 81 mg po Daily  for now  -EKG is not particularly revealing -Cardiology input is appreciated and recommending ECHOCardiogram if she rules out for COVID (Still pending today) and further care based on ECHO Results.  -BNP was 449.8 -Lipid Panel total cholesterol level of 28, HDL 47, LDL 71, triglycerides of 52, VLDL 10 -Patient is on Heparin for now and will be discontinued tonight  -Further management will depend on hospital course and if she rules out for COVID  Acute Viral Syndrome and URI Symptoms and Concern for COVID-19 -CTA PE showed "No pulmonary embolus. Bibasilar atelectasis or scarring. No acute intrathoracic abnormality. Coronary artery calcifications." -Initial CXR showed "Heart size and mediastinal contours are within normal limits. Lungs are clear. No pleural effusion or pneumothorax seen. Osseous structures about the chest are unremarkable" -Repeat CXR 04/09/2019 showed "Mild progression of bibasilar airspace disease right greater than left and small right effusion. Possible pneumonia versus atelectasis." -Empirically started on Abx with IV Azithromycin and IV Ceftriaxone but was not continued  -Respiratory Virus Panel Negative -Will check Influenza Panel (Not done yet) -SARS-CoV-2, NAA Sent and pending  -C/w Breathing Tx as Below -Initial COVID Labs and ESR was 4, D-Dimer was 0.63, PCT was <0.10, CK was 72, LDH was 131, Ferritin was 223, and CRP was <0.8 -Repeat COVID Labs showed PCT of <0.10 x3, Ferritin of 212, ESR of  7, D-Dimer of 0.53 -> 0.44 -WBC was slightly elevated and was 10.7 and now trended down to 8.8 -> 9.6 -Started Zinc Sulfate 220 mg po Daily and Vitamin C 500 mg po Daily  -Continue to Monitor Daily COVID Labs  Tobacco Abuse -Smoking  Cessation Counseling Given -Will ask Patient if she would like a Nicotine Patch   COPD with Possible Exacerbation: -Azithromycin 500 mg IV given x1 in the ED -C/w Ipratropium-Albuterol Respimat 1 puff IH q6h and q6hprn -C/w Budesonide 180 mcg/ACT 1 puff IH BID  Diarrhea -Patient reported 6 weeks of diarrhea -No diarrhea in the last 48 hours -Patient has a history of [name] -C/w Cholestyramine 4 gra po BID and Hyoscyamine 0.125 mg SL q6h -Continue to monitor closely  Mild Hypokalemia -Likely secondary to the diarrhea; Was 3.5 on Admission and replete and now 3.7 -Continue to monitor and replete as necessary -Checked Mag Level and was 1.8 -Continue to Monitor and Repeat CMP in AM   Volume Depletion -Given IVF and had a a bolus this AM; C/w IVF with 0.9 % NaCl + 20 mEW at 100 mL/hr and reduced rate to 75 mL/hr x 1 day and now stopped  -Continue to Monitor Volume Status in AM  Macrocytic Anemia -Patient's Hgb/Hct went from 13.4/42.2 -> 10.6/33.1 -> 10.4/33.6 -> 11.2/35.4 -In the setting of IVF Resuscitation and ? Dilutional Drop -Check Anemia Panel in AM -Continue to Monitor for S/Sx of Bleeding now that patient is on Anticoagulation -Repeat CBC in AM   Anxiety and Depression -C/w Clonazeapm 1 mg po qHS and Aripiprazole 2 mg po Daily and Sertraline 150 mg po Daily   Chronic Pain Syndrome -C/w Morphine 30 mg po q8h and Oxycodone 10 mg po q4hprn  GERD/Indigestion -Started Maalox yesterday -Started Pantoprazole 40 mg po Daily today  Hypophosphatemia  -Patient's phosphorus level this morning was 2.9 -Continue to Monitor and Replete as Necessary -Repeat Phos Level in AM   DVT prophylaxis: Anticoagulated with Heparin gtt and this is to stop tonight  Code Status: FULL CODE  Family Communication: No family present at bedside  Disposition Plan: Remain Inpatient for current workup and Treatment with Heparin gtt to stop today; COVID Testing should result later today and then  further plan of care be be delineated.   Consultants:   Cardiology   Procedures:  None but ECHOCARDIOGRAM will be ordered if she is COVID Negative and if that study does not show Regional Wall Motion Abnormalities or Decreased LVEF, then further coronary evaluation per Cardiology can be delayed until they can perform outpatient Coronary CT Angiography or NST.   Antimicrobials:  Anti-infectives (From admission, onward)   Start     Dose/Rate Route Frequency Ordered Stop   04/07/19 1830  cefTRIAXone (ROCEPHIN) 1 g in sodium chloride 0.9 % 100 mL IVPB     1 g 200 mL/hr over 30 Minutes Intravenous  Once 04/07/19 1821 04/07/19 2000   04/07/19 1830  azithromycin (ZITHROMAX) 500 mg in sodium chloride 0.9 % 250 mL IVPB     500 mg 250 mL/hr over 60 Minutes Intravenous  Once 04/07/19 1821 04/07/19 2147     Subjective: Seen and examined and doing okay.  Denies any chest pain but states that it hurt when she took a deep inspiratory breaths.  No lightheadedness or dizziness.  No leg edema and no nausea or vomiting.  No other concerns or complaints at this time and happy that she is to get off a heparin drip today.  Objective:  Vitals:   04/09/19 0846 04/09/19 2206 04/10/19 0000 04/10/19 0804  BP:  (!) 148/93  127/79  Pulse:   83 65  Resp:   18 16  Temp:  98.1 F (36.7 C)  98.7 F (37.1 C)  TempSrc:  Oral Oral Oral  SpO2: 96% 97% 97% 98%  Weight:      Height:        Intake/Output Summary (Last 24 hours) at 04/10/2019 1437 Last data filed at 04/10/2019 0500 Gross per 24 hour  Intake 875.05 ml  Output -  Net 875.05 ml   Filed Weights   04/07/19 1532 04/07/19 2215  Weight: 53.1 kg 57.1 kg   Examination: Physical Exam:  Constitutional: Well-nourished, well-developed thin Caucasian female currently no acute distress but appears slightly frustrated. Eyes: Lids and conjunctive are normal.  Sclera anicteric ENMT: External ears and nose appear normal.  Grossly normal hearing. Neck:  Appears supple no JVD Respiratory: Mildly diminished auscultation bilaterally with no appreciable wheezing, rales, capital patient not tachypneic wheezing any accessory muscles to breathe. Cardiovascular: Regular rate and rhythm.  No appreciable murmurs, rubs, gallops. Abdomen: Soft, nontender, nondistended.  Bowel sounds present GU: Deferred Musculoskeletal: No contractures or cyanosis.  No joint deformities in upper or lower extremities Skin: No appreciable rashes or lesions on to skin evaluation Neurologic: Cranial nerves II through XII grossly intact no appreciable focal deficits.  Romberg sign cerebellar reflexes were not assessed Psychiatric: Normal judgment and insight.  She is awake, alert, oriented x3.  Slightly depressed appearing and not as anxious today but has a flat affect  Data Reviewed: I have personally reviewed following labs and imaging studies  CBC: Recent Labs  Lab 04/07/19 1700 04/08/19 0238 04/09/19 0615 04/10/19 0245  WBC 10.7* 10.7* 8.8 9.6  NEUTROABS 6.1  --  3.9  --   HGB 13.4 10.6* 10.4* 11.2*  HCT 42.2 33.1* 33.6* 35.4*  MCV 102.7* 100.6* 103.1* 100.9*  PLT 295 230 224 326   Basic Metabolic Panel: Recent Labs  Lab 04/07/19 1700 04/08/19 0238 04/09/19 0615 04/10/19 1035  NA 135 138 140 140  K 3.5 3.4* 3.8 3.7  CL 93* 103 107 106  CO2 '29 24 24 25  ' GLUCOSE 114* 96 85 93  BUN 5* 6* <5* <5*  CREATININE 1.00 0.87 0.56 0.60  CALCIUM 9.1 7.6* 7.9* 8.4*  MG  --  1.9 1.7 1.8  PHOS  --  3.5 2.2* 2.9   GFR: Estimated Creatinine Clearance: 58 mL/min (by C-G formula based on SCr of 0.6 mg/dL). Liver Function Tests: Recent Labs  Lab 04/07/19 1700 04/08/19 0238 04/09/19 0615 04/10/19 1035  AST '25 20 18 17  ' ALT '11 8 8 8  ' ALKPHOS 95 66 59 70  BILITOT 0.6 0.6 0.4 0.7  PROT 6.7 5.0* 4.8* 5.5*  ALBUMIN 3.9 2.8* 2.7* 3.0*   Recent Labs  Lab 04/07/19 1700  LIPASE 25   No results for input(s): AMMONIA in the last 168 hours. Coagulation Profile:  No results for input(s): INR, PROTIME in the last 168 hours. Cardiac Enzymes: Recent Labs  Lab 04/07/19 1700 04/07/19 2007 04/08/19 0238 04/08/19 1111  CKTOTAL  --   --   --  72  TROPONINI 1.07* 1.11* 0.83*  --    BNP (last 3 results) No results for input(s): PROBNP in the last 8760 hours. HbA1C: No results for input(s): HGBA1C in the last 72 hours. CBG: No results for input(s): GLUCAP in the last 168 hours. Lipid Profile: Recent Labs  04/09/19 0615  CHOL 128  HDL 47  LDLCALC 71  TRIG 52  CHOLHDL 2.7   Thyroid Function Tests: Recent Labs    04/08/19 0238  TSH 0.499   Anemia Panel: Recent Labs    04/08/19 1111 04/08/19 2114  FERRITIN 223 212   Sepsis Labs: Recent Labs  Lab 04/07/19 1700 04/08/19 1111 04/09/19 0615 04/10/19 0245  PROCALCITON  --  <0.10 <0.10 <0.10  LATICACIDVEN 1.0  --   --   --     Recent Results (from the past 240 hour(s))  Blood culture (routine x 2)     Status: None (Preliminary result)   Collection Time: 04/07/19  5:00 PM  Result Value Ref Range Status   Specimen Description BLOOD LEFT WRIST  Final   Special Requests   Final    BOTTLES DRAWN AEROBIC AND ANAEROBIC Blood Culture adequate volume   Culture   Final    NO GROWTH 3 DAYS Performed at [facility], [place] [address] [place], [place] [address]    Report Status PENDING  Incomplete  Blood culture (routine x 2)     Status: None (Preliminary result)   Collection Time: 04/07/19  5:00 PM  Result Value Ref Range Status   Specimen Description BLOOD RIGHT ANTECUBITAL  Final   Special Requests   Final    BOTTLES DRAWN AEROBIC AND ANAEROBIC Blood Culture results may not be optimal due to an inadequate volume of blood received in culture bottles   Culture   Final    NO GROWTH 3 DAYS Performed at [facility], [place] [address] [place], [place] [address]    Report Status PENDING  Incomplete  Respiratory Panel by PCR     Status: None   Collection Time: 04/08/19  4:36  PM  Result Value Ref Range Status   Adenovirus NOT DETECTED NOT DETECTED Final   Coronavirus 229E NOT DETECTED NOT DETECTED Final    Comment: (NOTE) The Coronavirus on the Respiratory Panel, DOES NOT test for the novel  Coronavirus (2019 nCoV)    Coronavirus HKU1 NOT DETECTED NOT DETECTED Final   Coronavirus NL63 NOT DETECTED NOT DETECTED Final   Coronavirus OC43 NOT DETECTED NOT DETECTED Final   Metapneumovirus NOT DETECTED NOT DETECTED Final   Rhinovirus / Enterovirus NOT DETECTED NOT DETECTED Final   Influenza A NOT DETECTED NOT DETECTED Final   Influenza B NOT DETECTED NOT DETECTED Final   Parainfluenza Virus 1 NOT DETECTED NOT DETECTED Final   Parainfluenza Virus 2 NOT DETECTED NOT DETECTED Final   Parainfluenza Virus 3 NOT DETECTED NOT DETECTED Final   Parainfluenza Virus 4 NOT DETECTED NOT DETECTED Final   Respiratory Syncytial Virus NOT DETECTED NOT DETECTED Final   Bordetella pertussis NOT DETECTED NOT DETECTED Final   Chlamydophila pneumoniae NOT DETECTED NOT DETECTED Final   Mycoplasma pneumoniae NOT DETECTED NOT DETECTED Final    Comment: Performed at [facility], [place] [name] [place],  [address]  MRSA PCR Screening     Status: None   Collection Time: 04/08/19  6:22 PM  Result Value Ref Range Status   MRSA by PCR NEGATIVE NEGATIVE Final    Comment:        The GeneXpert MRSA Assay (FDA approved for NASAL specimens only), is one component of a comprehensive MRSA colonization surveillance program. It is not intended to diagnose MRSA infection nor to guide or monitor treatment for MRSA infections. Performed at [facility], [place]  [address], [place], [place] [address]     Radiology Studies: Dg Chest Port 1 View  Result Date: 04/09/2019 CLINICAL DATA:  Short of breath EXAM: PORTABLE CHEST 1 VIEW COMPARISON:  04/07/2019 FINDINGS: Progression of mild right lower lobe airspace disease. Left lower lobe atelectasis slightly progressive. Small right  effusion. Negative for heart failure. IMPRESSION: Mild progression of bibasilar airspace disease right greater than left and small right effusion. Possible pneumonia versus atelectasis. Electronically Signed   By: Franchot Gallo M.D.   On: 04/09/2019 10:53   Scheduled Meds: . ARIPiprazole  2 mg Oral Daily  . aspirin  81 mg Oral Daily  . budesonide  1 puff Inhalation BID  . cholestyramine  4 g Oral BID WC  . clonazePAM  1 mg Oral QHS  . folic acid  1 mg Oral Daily  . Ipratropium-Albuterol  1 puff Inhalation Q6H  . mouth rinse  15 mL Mouth Rinse BID  . morphine  30 mg Oral Q8H  . sertraline  150 mg Oral Daily  . vitamin B-12  100 mcg Oral Daily   Continuous Infusions: . heparin 1,100 Units/hr (04/10/19 0653)    LOS: 3 days   Kerney Elbe, DO [facility] PAGER is on [place]  If 7PM-7AM, please contact night-coverage [email or web address] Password [facility] 04/10/2019, 2:37 PM

## 2019-04-10 NOTE — Telephone Encounter (Signed)
Noted  

## 2019-04-10 NOTE — Progress Notes (Signed)
Alert and oriented  X 4. Ambulating in room with minimal assistance. On room air O2 SAT 95-96% Call bell within reach encouraged to report signs and symptoms to staff. Continues on heparin therapy at 11 ml/hr with no adverse reactions.

## 2019-04-10 NOTE — Progress Notes (Addendum)
ANTICOAGULATION CONSULT NOTE  Pharmacy Consult:  Heparin Indication: chest pain/ACS  Patient Measurements: Height: 5\' 3"  (160 cm) Weight: 125 lb 14.4 oz (57.1 kg) IBW/kg (Calculated) : 52.4 Heparin Dosing Weight: 53 kg  Vital Signs: Temp: 98.7 F (37.1 C) (04/13 0804) Temp Source: Oral (04/13 0804) BP: 127/79 (04/13 0804) Pulse Rate: 65 (04/13 0804)  Labs: Recent Labs    04/07/19 1700 04/07/19 2007  04/08/19 0238 04/08/19 1111  04/09/19 0615 04/09/19 1750 04/10/19 0245 04/10/19 1035 04/10/19 1043  HGB 13.4  --   --  10.6*  --   --  10.4*  --  11.2*  --   --   HCT 42.2  --   --  33.1*  --   --  33.6*  --  35.4*  --   --   PLT 295  --   --  230  --   --  224  --  225  --   --   HEPARINUNFRC  --   --    < > 0.23* 0.34   < > 0.25* 0.28* 0.35  --  0.49  CREATININE 1.00  --   --  0.87  --   --  0.56  --   --  0.60  --   CKTOTAL  --   --   --   --  72  --   --   --   --   --   --   TROPONINI 1.07* 1.11*  --  0.83*  --   --   --   --   --   --   --    < > = values in this interval not displayed.     Assessment: 40 YOF presented with SOB and back pain.  Found to have elevated troponin and Pharmacy consulted to initiate IV heparin for ACS.    Heparin therapeutic again this AM. Heparin may be stopped today per Dr Georgina Quint.   Addendum: Will stop IV heparin tonight at 1800 for 72hr course. Ok per Dr. Georgina Quint  Goal of Therapy:  Heparin level 0.3-0.7 units/ml Monitor platelets by anticoagulation protocol: Yes   Plan:  Cont heparin at 1100 units/hr Daily heparin level and CBC  Onnie Boer, PharmD, BCIDP, AAHIVP, CPP Infectious Disease Pharmacist 04/10/2019 12:44 PM  Stop heparin at 1800.  Onnie Boer, PharmD, BCIDP, AAHIVP, CPP Infectious Disease Pharmacist 04/10/2019 2:31 PM

## 2019-04-10 NOTE — Care Management Important Message (Signed)
Important Message  Patient Details  Name: Denise King MRN: [id number] Date of Birth: February 03, 1951   Medicare Important Message Given:  Yes    Gerrianne Scale Daniel Ritthaler, LCSW 04/10/2019, 2:52 PM

## 2019-04-11 ENCOUNTER — Inpatient Hospital Stay (HOSPITAL_COMMUNITY): Payer: Medicare HMO

## 2019-04-11 ENCOUNTER — Encounter (HOSPITAL_COMMUNITY): Payer: Self-pay | Admitting: Family

## 2019-04-11 DIAGNOSIS — Z720 Tobacco use: Secondary | ICD-10-CM

## 2019-04-11 DIAGNOSIS — R197 Diarrhea, unspecified: Secondary | ICD-10-CM

## 2019-04-11 DIAGNOSIS — G894 Chronic pain syndrome: Secondary | ICD-10-CM

## 2019-04-11 DIAGNOSIS — F411 Generalized anxiety disorder: Secondary | ICD-10-CM

## 2019-04-11 DIAGNOSIS — K580 Irritable bowel syndrome with diarrhea: Secondary | ICD-10-CM

## 2019-04-11 DIAGNOSIS — R079 Chest pain, unspecified: Secondary | ICD-10-CM

## 2019-04-11 DIAGNOSIS — K219 Gastro-esophageal reflux disease without esophagitis: Secondary | ICD-10-CM

## 2019-04-11 DIAGNOSIS — B349 Viral infection, unspecified: Secondary | ICD-10-CM

## 2019-04-11 LAB — COMPREHENSIVE METABOLIC PANEL
ALT: 8 U/L (ref 0–44)
AST: 17 U/L (ref 15–41)
Albumin: 3.1 g/dL — ABNORMAL LOW (ref 3.5–5.0)
Alkaline Phosphatase: 63 U/L (ref 38–126)
Anion gap: 12 (ref 5–15)
BUN: <5 mg/dL — ABNORMAL LOW (ref 8–23)
CO2: 28 mmol/L (ref 22–32)
Calcium: 8.7 mg/dL — ABNORMAL LOW (ref 8.9–10.3)
Chloride: 101 mmol/L (ref 98–111)
Creatinine, Ser: 0.74 mg/dL (ref 0.44–1.00)
GFR calc Af Amer: >60 mL/min (ref >60)
GFR calc non Af Amer: >60 mL/min (ref >60)
Glucose, Bld: 91 mg/dL (ref 70–99)
Potassium: 3.7 mmol/L (ref 3.5–5.1)
Sodium: 141 mmol/L (ref 135–145)
Total Bilirubin: 0.7 mg/dL (ref 0.3–1.2)
Total Protein: 5.9 g/dL — ABNORMAL LOW (ref 6.5–8.1)

## 2019-04-11 LAB — RETICULOCYTES
Immature Retic Fract: 18.3 % — ABNORMAL HIGH (ref 2.3–15.9)
RBC.: 3.62 MIL/uL — ABNORMAL LOW (ref 3.87–5.11)
Retic Count, Absolute: 145.9 10*3/uL (ref 19.0–186.0)
Retic Ct Pct: 4 % — ABNORMAL HIGH (ref 0.4–3.1)

## 2019-04-11 LAB — VITAMIN B12: Vitamin B-12: 933 pg/mL — ABNORMAL HIGH (ref 180–914)

## 2019-04-11 LAB — CBC WITH DIFFERENTIAL/PLATELET
Abs Immature Granulocytes: 0.01 10*3/uL (ref 0.00–0.07)
Basophils Absolute: 0 10*3/uL (ref 0.0–0.1)
Basophils Relative: 0 %
Eosinophils Absolute: 0.1 10*3/uL (ref 0.0–0.5)
Eosinophils Relative: 1 %
HCT: 36.8 % (ref 36.0–46.0)
Hemoglobin: 11.9 g/dL — ABNORMAL LOW (ref 12.0–15.0)
Immature Granulocytes: 0 %
Lymphocytes Relative: 46 %
Lymphs Abs: 3.2 10*3/uL (ref 0.7–4.0)
MCH: 32.9 pg (ref 26.0–34.0)
MCHC: 32.3 g/dL (ref 30.0–36.0)
MCV: 101.7 fL — ABNORMAL HIGH (ref 80.0–100.0)
Monocytes Absolute: 0.7 10*3/uL (ref 0.1–1.0)
Monocytes Relative: 10 %
Neutro Abs: 3 10*3/uL (ref 1.7–7.7)
Neutrophils Relative %: 43 %
Platelets: 231 10*3/uL (ref 150–400)
RBC: 3.62 MIL/uL — ABNORMAL LOW (ref 3.87–5.11)
RDW: 13.7 % (ref 11.5–15.5)
WBC: 6.9 10*3/uL (ref 4.0–10.5)
nRBC: 0 % (ref 0.0–0.2)

## 2019-04-11 LAB — NOVEL CORONAVIRUS, NAA (HOSP ORDER, SEND-OUT TO REF LAB; TAT 18-24 HRS): SARS-CoV-2, NAA: NOT DETECTED

## 2019-04-11 LAB — ECHOCARDIOGRAM LIMITED
Height: 63 in
Weight: 2014.4 oz

## 2019-04-11 LAB — IRON AND TIBC
Iron: 123 ug/dL (ref 28–170)
Saturation Ratios: 55 % — ABNORMAL HIGH (ref 10.4–31.8)
TIBC: 224 ug/dL — ABNORMAL LOW (ref 250–450)
UIBC: 101 ug/dL

## 2019-04-11 LAB — D-DIMER, QUANTITATIVE: D-Dimer, Quant: 0.58 ug/mL-FEU — ABNORMAL HIGH (ref 0.00–0.50)

## 2019-04-11 LAB — SEDIMENTATION RATE: Sed Rate: 13 mm/hr (ref 0–22)

## 2019-04-11 LAB — FOLATE: Folate: 27 ng/mL (ref >5.9)

## 2019-04-11 LAB — PHOSPHORUS: Phosphorus: 4.9 mg/dL — ABNORMAL HIGH (ref 2.5–4.6)

## 2019-04-11 LAB — MAGNESIUM: Magnesium: 2 mg/dL (ref 1.7–2.4)

## 2019-04-11 LAB — C-REACTIVE PROTEIN: CRP: 0.8 mg/dL (ref <1.0)

## 2019-04-11 LAB — FERRITIN: Ferritin: 206 ng/mL (ref 11–307)

## 2019-04-11 MED ORDER — PANTOPRAZOLE SODIUM 40 MG PO TBEC: 40.0000 mg | DELAYED_RELEASE_TABLET | Freq: Every day | ORAL | 0 refills | Status: DC

## 2019-04-11 MED ORDER — ASPIRIN 81 MG PO CHEW: 81.0000 mg | CHEWABLE_TABLET | Freq: Every day | ORAL | 0 refills | Status: DC

## 2019-04-11 MED ORDER — IPRATROPIUM-ALBUTEROL 20-100 MCG/ACT IN AERS: 1.0000 | INHALATION_SPRAY | Freq: Four times a day (QID) | RESPIRATORY_TRACT | 0 refills | Status: DC | PRN

## 2019-04-11 MED FILL — ASPIRIN LOW DOSE 81 MG CHEW: 81 | 30 days supply | Qty: 30 | Fill #0

## 2019-04-11 MED FILL — PANTOPRAZOLE SOD DR 40 MG T: 40 | 30 days supply | Qty: 30 | Fill #0

## 2019-04-11 NOTE — Progress Notes (Signed)
  Echocardiogram 2D Echocardiogram has been performed.  Jennette Dubin 04/11/2019, 12:12 PM

## 2019-04-11 NOTE — Discharge Summary (Signed)
Physician Discharge Summary  Denise King TJQ:[id number] DOB: 1951/08/17 DOA: 04/07/2019  PCP: Biagio Borg, MD  Admit date: 04/07/2019 Discharge date: 04/11/2019  Admitted From: Home Disposition: Home  Recommendations for Outpatient Follow-up:  1. Follow up with PCP in 1-2 weeks 2. Follow up with Cardiology Dr. Johnsie Cancel within 1-2 weeks;  3. Please obtain CMP/CBC, Mag, Phos in one week 4. Repeat CXR in 3-6 weeks 5. Please follow up on the following pending results: [place]: No Equipment/Devices: None    Discharge Condition: Stable CODE STATUS: FULL CODE  Diet recommendation: Heart Healthy Diet Low Sodium Diet  Brief/Interim Summary: HPI per Dr. Dana Allan on 04/07/2019 Patient is a 68 year old female with past medical history significant for irritable bowel syndrome, tobacco use disorder (over 40 pack years), chronic bronchitis, alcohol dependence, chronic pain amongst other medical problems. Patient presents with 6-week history of diarrhea. Patient reports daily bowel movement that is up to 8 times daily, and said to be watery. Last diarrhea stool was 2 days ago. According to the patient, she has not had appetite, has not eaten much in the last few days and has been feeling generally unwell. She developed nausea and vomiting in the last 24 hours, as well as fever, chills, shortness of breath, sore throat and some URI symptoms. Patient developed chest pressure as well, said to be constant, and only got relief to him patient was put on oxygen on presentation to the hospital. Currently, patient is chest pain-free. First troponin is greater than 1. EKG is not very revealing. No headache, no neck pain, no urinary symptoms. CTA of the chest is negative for pulmonary embolism, however, it revealed bibasilar atelectasis. Patient will be admitted for further assessment and management.  **Interim History Still was complaining of some chest discomfort but  now improved today.  Cardiology was evaluating and she is on a heparin drip and feel that her Troponin level is flat and not consistent with true Acute Coronary Symptoms. Per Dr. Sallyanne Kuster no urgent cornary evaluation is neede and if Negative for COVID19 Disease will need ECHOCardiogram and then possible Coronary CT Angiography vs. Nuclear Stress Testing as an outpatient. Heparin gtt was continued for 72 hours. Further Care to be delineated once COVID Testing results but Cardiology felt as if ECHO could be done regardless of COVID results and so a Limited ECHOCardiogram was done which showed Normal EF and mild G1DD. She was deemed medically stable to discharge as her Respiratory Status improved and she no longer was complaining of Chest Discomfort. Patien'ts only complaint was being fatigued. COVID-19 SARS-CoV-2 test is pending still and PCP will need to follow up results. She was told to socially isolate and distance and HomeCare Guidelines were provided since she was deemed Medically Stable.   Discharge Diagnoses:  Active Problems:   Chronic pain syndrome   GERD   Irritable bowel syndrome   Generalized anxiety disorder   Diarrhea   SOB (shortness of breath)   Chest pain   Acute viral syndrome   Tobacco abuse   Hyperphosphatemia  Chest pressure/Elevated Troponin r/o ACS -Chest pressure/tightness improved with supplemental oxygen. -Troponin is elevated and trended up to 1.11 and is now trending down aqnd went to 0.83 -C/w ASA 81 mg po Daily  for now at D/C; Discussed with Dr. Meda Coffee and she recommended ASA daily and Statin but patient's Lipid Panel was normal so will defer to Cariology to start as an outpatient  -EKG is not particularly revealing -Cardiologyinput  is appreciated and recommending ECHOCardiogram if she rules out for COVID (Still pending still today) and further care based on ECHO Results. Dr. Meda Coffee of Cardiology ordered ECHO regardless of her ruling out for COVID -ECHOCardiogram  showed "The left ventricle has normal systolic function, with an ejection fraction of 60-65%. The cavity size was normal. Left ventricular diastolic Doppler parameters are consistent with impaired relaxation. The mitral valve is grossly normal. The tricuspid valve was grossly normal. The aortic valve is tricuspid Aortic valve regurgitation was not assessed by color flow Doppler. No stenosis of the aortic valve.Limited study for LV function; full doppler not performed; normal LV function; mild diastolic dysfunction." -BNP was 449.8 -Lipid Panel total cholesterol level of 28, HDL 47, LDL 71, triglycerides of 52, VLDL 10 -Heparin gtt now stopped   -Further management will depended on hospital course and if she rules out for COVID but Cardiology ordered ECHO regardless -Per Cardiology Further Coronary evaluation can be delayed until they can perform outpatient Coronary CT Angiography or Nuclear Stress Testing -The case with Dr. Ena Dawley who recommends discharging the patient home and following up with Cardiology within 1 week  Acute Viral Syndrome and URI Symptoms and Concern for COVID-19 -CTA PE showed "No pulmonary embolus. Bibasilar atelectasis or scarring. No acute intrathoracic abnormality. Coronary artery calcifications." -Initial CXR showed "Heart size and mediastinal contours are within normal limits. Lungs are clear. No pleural effusion or pneumothorax seen. Osseous structures about the chest are unremarkable" -Repeat CXR 04/09/2019 showed "Mild progression of bibasilar airspace disease right greater than left and small right effusion. Possible pneumonia versus atelectasis." -CXR this AM showed "The heart size and mediastinal contours are within normal limits. Normal pulmonary vascularity. Atherosclerotic calcification of the aortic arch. Improved aeration of the right lower lobe with trace residual pleural effusion. Bandlike atelectasis at the peripheral left lung base. No consolidation or  pneumothorax. No acute osseous abnormality." -Patient no longer having Respiratory Symptoms but still complaining of Fatigue  -Blood Cx x2 Showed NGTD at 4 Days  -Empirically started on Abx with IV Azithromycin and IV Ceftriaxone in the ED but was not continued  -Respiratory Virus Panel Negative -Influenza Panel (Not done yet) was ordered and never done -SARS-CoV-2, NAA Sent and pending still -C/w Breathing Tx as Below -Initial COVID Labs and ESR was 4, D-Dimer was 0.63, PCT was <0.10, CK was 72, LDH was 131, Ferritin was 223, and CRP was <0.8 -Repeat COVID Labs showed PCT of <0.10 x3, Ferritin of 212, ESR of 7, D-Dimer of 0.53 -> 0.44 -> 0.58 -WBC was slightly elevated and was 10.7 and now trended down to 8.8 -> 9.6 -> 6.9 -Respiratory Symptoms are improved  -Started Zinc Sulfate 220 mg po Daily and Vitamin C 500 mg po Daily but will now D/C -Home COVID Isolation Guidelines given  -Continue to Monitor and follow up with PCP   Tobacco Abuse -Smoking Cessation Counseling Given  COPD with Possible Exacerbation, improved -Azithromycin 500 mg IV given x1 in the ED -C/w Ipratropium-Albuterol Respimat 1 puff IH  q6hprn at D/C -C/w Budesonide 180 mcg/ACT 1 puff IH BID while hospitalized and will not Continue at D/C  Diarrhea -Patient reported 6 weeks of diarrhea -No diarrhea in the last 48-72 hours -Patient has a history of [name] and had some Nausea and Vomiting yesterday -C/w Cholestyramine 4 gra po BID and Hyoscyamine 0.125 mg SL q6h -Continue to monitor closely; Follow up with GI as an outpatient   Mild Hypokalemia -Likely secondary to the  diarrhea; Was 3.5 on Admission and replete and now 3.7 -Continue to monitor and replete as necessary -Checked Mag Level and was 2.0 -Continue to Monitor and Repeat CMP in AM   Volume Depletion -Given IVF and had a a bolus this AM; C/w IVF with 0.9 % NaCl + 20 mEW at 100 mL/hr and reduced rate to 75 mL/hr x 1 day and now stopped  -Continue to  Monitor Volume Status as an outpatient   Macrocytic Anemia -Patient's Hgb/Hct went from 13.4/42.2 -> 10.6/33.1 -> 10.4/33.6 -> 11.2/35.4 -In the setting of IVF Resuscitation and ? Dilutional Drop -Checked Anemia Panel and showed iron level of 123, U IBC of 101, TIBC of 224, saturation ratios of 55%, ferritin level 206, folate level 27.0, and vitamin B12 level of 933 -Continue to Monitor for S/Sx of Bleeding now that patient is on Anticoagulation -Repeat CBC as an outpatient   Anxiety and Depression -C/w Clonazeapm 1 mg po qHS and Aripiprazole 2 mg po Daily and Sertraline 150 mg po Daily   Chronic Pain Syndrome -C/w Morphine 30 mg po q8h and Oxycodone 10 mg po q4hprn  GERD/Indigestion -Started Maalox yesterday -Started Pantoprazole 40 mg po Daily and will continue at D/C   Hypophosphatemia -> Hyperphosphatemia  -Patient's Phosphorus Level was 2.2 -> 2.9 -> 4.9 -Continue to Monitor and Replete as Necessary -Repeat Phos Level as an outpatient   Discharge Instructions  Discharge Instructions    Call MD for:   Complete by:  As directed    Call MD for:  difficulty breathing, headache or visual disturbances   Complete by:  As directed    Call MD for:  extreme fatigue   Complete by:  As directed    Call MD for:  hives   Complete by:  As directed    Call MD for:  persistant dizziness or light-headedness   Complete by:  As directed    Call MD for:  persistant nausea and vomiting   Complete by:  As directed    Call MD for:  redness, tenderness, or signs of infection (pain, swelling, redness, odor or green/yellow discharge around incision site)   Complete by:  As directed    Call MD for:  severe uncontrolled pain   Complete by:  As directed    Call MD for:  temperature >100.4   Complete by:  As directed    Diet - low sodium heart healthy   Complete by:  As directed    Discharge instructions   Complete by:  As directed    You were cared for by a hospitalist during your  hospital stay. If you have any questions about your discharge medications or the care you received while you were in the hospital after you are discharged, you can call the unit and ask to speak with the hospitalist on call if the hospitalist that took care of you is not available. Once you are discharged, your primary care physician will handle any further medical issues. Please note that NO REFILLS for any discharge medications will be authorized once you are discharged, as it is imperative that you return to your primary care physician (or establish a relationship with a primary care physician if you do not have one) for your aftercare needs so that they can reassess your need for medications and monitor your lab values.  Follow up with PCP and Cardiology as an outpatient . Take all medications as prescribed. If symptoms change or worsen please return to the ED  for evaluation   Person Under Monitoring Name: Denise King  Location: [place] [address]   Infection Prevention Recommendations for Individuals Confirmed to have, or Being Evaluated for, 2019 Novel Coronavirus (COVID-19) Infection Who Receive Care at Home  Individuals who are confirmed to have, or are being evaluated for, COVID-19 should follow the prevention steps below until a healthcare provider or local or state health department says they can return to normal activities.  Stay home except to get medical care You should restrict activities outside your home, except for getting medical care. Do not go to work, school, or public areas, and do not use public transportation or taxis.  Call ahead before visiting your doctor Before your medical appointment, call the healthcare provider and tell them that you have, or are being evaluated for, COVID-19 infection. This will help the healthcare provider's office take steps to keep other people from getting infected. Ask your healthcare provider to call the  local or state health department.  Monitor your symptoms Seek prompt medical attention if your illness is worsening (e.g., difficulty breathing). Before going to your medical appointment, call the healthcare provider and tell them that you have, or are being evaluated for, COVID-19 infection. Ask your healthcare provider to call the local or state health department.  Wear a facemask You should wear a facemask that covers your nose and mouth when you are in the same room with other people and when you visit a healthcare provider. People who live with or visit you should also wear a facemask while they are in the same room with you.  Separate yourself from other people in your home As much as possible, you should stay in a different room from other people in your home. Also, you should use a separate bathroom, if available.  Avoid sharing household items You should not share dishes, drinking glasses, cups, eating utensils, towels, bedding, or other items with other people in your home. After using these items, you should wash them thoroughly with soap and water.  Cover your coughs and sneezes Cover your mouth and nose with a tissue when you cough or sneeze, or you can cough or sneeze into your sleeve. Throw used tissues in a lined trash can, and immediately wash your hands with soap and water for at least 20 seconds or use an alcohol-based hand rub.  Wash your [facility] your hands often and thoroughly with soap and water for at least 20 seconds. You can use an alcohol-based hand sanitizer if soap and water are not available and if your hands are not visibly dirty. Avoid touching your eyes, nose, and mouth with unwashed hands.   Prevention Steps for Caregivers and Household Members of Individuals Confirmed to have, or Being Evaluated for, COVID-19 Infection Being Cared for in the Home  If you live with, or provide care at home for, a person confirmed to have, or being evaluated for,  COVID-19 infection please follow these guidelines to prevent infection:  Follow healthcare provider's instructions Make sure that you understand and can help the patient follow any healthcare provider instructions for all care.  Provide for the patient's basic needs You should help the patient with basic needs in the home and provide support for getting groceries, prescriptions, and other personal needs.  Monitor the patient's symptoms If they are getting sicker, call his or her medical provider and tell them that the patient has, or is being evaluated for, COVID-19 infection. This will help  the healthcare provider's office take steps to keep other people from getting infected. Ask the healthcare provider to call the local or state health department.  Limit the number of people who have contact with the patient If possible, have only one caregiver for the patient. Other household members should stay in another home or place of residence. If this is not possible, they should stay in another room, or be separated from the patient as much as possible. Use a separate bathroom, if available. Restrict visitors who do not have an essential need to be in the home.  Keep older adults, very young children, and other sick people away from the patient Keep older adults, very young children, and those who have compromised immune systems or chronic health conditions away from the patient. This includes people with chronic heart, lung, or kidney conditions, diabetes, and cancer.  Ensure good ventilation Make sure that shared spaces in the home have good air flow, such as from an air conditioner or an opened window, weather permitting.  Wash your hands often Wash your hands often and thoroughly with soap and water for at least 20 seconds. You can use an alcohol based hand sanitizer if soap and water are not available and if your hands are not visibly dirty. Avoid touching your eyes, nose, and mouth  with unwashed hands. Use disposable paper towels to dry your hands. If not available, use dedicated cloth towels and replace them when they become wet.  Wear a facemask and gloves Wear a disposable facemask at all times in the room and gloves when you touch or have contact with the patient's blood, body fluids, and/or secretions or excretions, such as sweat, saliva, sputum, nasal mucus, vomit, urine, or feces.  Ensure the mask fits over your nose and mouth tightly, and do not touch it during use. Throw out disposable facemasks and gloves after using them. Do not reuse. Wash your hands immediately after removing your facemask and gloves. If your personal clothing becomes contaminated, carefully remove clothing and launder. Wash your hands after handling contaminated clothing. Place all used disposable facemasks, gloves, and other waste in a lined container before disposing them with other household waste. Remove gloves and wash your hands immediately after handling these items.  Do not share dishes, glasses, or other household items with the patient Avoid sharing household items. You should not share dishes, drinking glasses, cups, eating utensils, towels, bedding, or other items with a patient who is confirmed to have, or being evaluated for, COVID-19 infection. After the person uses these items, you should wash them thoroughly with soap and water.  Wash laundry thoroughly Immediately remove and wash clothes or bedding that have blood, body fluids, and/or secretions or excretions, such as sweat, saliva, sputum, nasal mucus, vomit, urine, or feces, on them. Wear gloves when handling laundry from the patient. Read and follow directions on labels of laundry or clothing items and detergent. In general, wash and dry with the warmest temperatures recommended on the label.  Clean all areas the individual has used often Clean all touchable surfaces, such as counters, tabletops, doorknobs, bathroom  fixtures, toilets, phones, keyboards, tablets, and bedside tables, every day. Also, clean any surfaces that may have blood, body fluids, and/or secretions or excretions on them. Wear gloves when cleaning surfaces the patient has come in contact with. Use a diluted bleach solution (e.g., dilute bleach with 1 part bleach and 10 parts water) or a household disinfectant with a label that says EPA-registered for coronaviruses.  To make a bleach solution at home, add 1 tablespoon of bleach to 1 quart (4 cups) of water. For a larger supply, add  cup of bleach to 1 gallon (16 cups) of water. Read labels of cleaning products and follow recommendations provided on product labels. Labels contain instructions for safe and effective use of the cleaning product including precautions you should take when applying the product, such as wearing gloves or eye protection and making sure you have good ventilation during use of the product. Remove gloves and wash hands immediately after cleaning.  Monitor yourself for signs and symptoms of illness Caregivers and household members are considered close contacts, should monitor their health, and will be asked to limit movement outside of the home to the extent possible. Follow the monitoring steps for close contacts listed on the symptom monitoring form.   ? If you have additional questions, contact your local health department or call the epidemiologist on call at [phone] (available 24/7). ? This guidance is subject to change. For the most up-to-date guidance from [facility], please refer to their website: YouBlogs.pl   Increase activity slowly   Complete by:  As directed      Allergies as of 04/11/2019      Reactions   Atorvastatin Nausea And Vomiting   Levofloxacin Other (See Comments)   Reaction: achilles tendon pain   Omnicef [cefdinir] Diarrhea   Uncontrollable diarrhea   Trazodone And Nefazodone  Other (See Comments)   "Felt like I was going to faint"   Sulfa Antibiotics Rash   Sulfonamide Derivatives Rash      Medication List    TAKE these medications   ARIPiprazole 2 MG tablet Commonly known as:  ABILIFY Take 1 tablet (2 mg total) by mouth daily for 30 days.   aspirin 81 MG chewable tablet Chew 1 tablet (81 mg total) by mouth daily. Start taking on:  April 12, 2019   cholestyramine 4 GM/DOSE powder Commonly known as:  QUESTRAN Take 1 packet (4 g total) by mouth 2 (two) times daily with a meal. What changed:    when to take this  reasons to take this   clonazePAM 1 MG tablet Commonly known as:  KLONOPIN Take 1 tablet (1 mg total) by mouth at bedtime. What changed:    when to take this  reasons to take this   denosumab 60 MG/ML Sosy injection Commonly known as:  PROLIA Inject 60 mg into the skin every 6 (six) months.   folic acid 1 MG tablet Commonly known as:  FOLVITE Take 1 tablet (1 mg total) by mouth daily.   hyoscyamine 0.125 MG Tbdp disintergrating tablet Commonly known as:  ANASPAZ Place 0.125 mg under the tongue every 6 (six) hours as needed ([name] cramps).   Ipratropium-Albuterol 20-100 MCG/ACT Aers respimat Commonly known as:  COMBIVENT Inhale 1 puff into the lungs every 6 (six) hours as needed for wheezing or shortness of breath.   morphine 30 MG 12 hr tablet Commonly known as:  MS CONTIN Take 30 mg by mouth every 8 (eight) hours.   Oxycodone HCl 10 MG Tabs Take 10 mg by mouth every 4 (four) hours.   pantoprazole 40 MG tablet Commonly known as:  PROTONIX Take 1 tablet (40 mg total) by mouth daily. Start taking on:  April 12, 2019   sertraline 100 MG tablet Commonly known as:  ZOLOFT Take 1.5 tablets (150 mg total) by mouth daily. What changed:  when to take this   triamcinolone cream  0.1 % Commonly known as:  KENALOG Apply 1 application topically 2 (two) times daily as needed (itching).   vitamin B-12 100 MCG tablet Commonly  known as:  CYANOCOBALAMIN Take 1 tablet (100 mcg total) by mouth daily.      Follow-up Information    Biagio Borg, MD. Call.   Specialties:  Internal Medicine, Radiology Why:  Follow up within 1 week Contact information: [place] [address] [phone]        Josue Hector, MD. Call.   Specialty:  Cardiology Why:  Follow up in the outpatient for further Cardiac Evaluation Contact information: [address] [place] [address] [phone]          Allergies  Allergen Reactions  . Atorvastatin Nausea And Vomiting  . Levofloxacin Other (See Comments)    Reaction: achilles tendon pain  . Omnicef [Cefdinir] Diarrhea    Uncontrollable diarrhea  . Trazodone And Nefazodone Other (See Comments)    "Felt like I was going to faint"  . Sulfa Antibiotics Rash  . Sulfonamide Derivatives Rash   Consultations:  Cardiology  Procedures/Studies: Ct Angio Chest Pe W And/or Wo Contrast  Result Date: 04/07/2019 CLINICAL DATA:  Shortness of breath. Cough. Fever. PE suspected, intermediate prob, neg D-dimer EXAM: CT ANGIOGRAPHY CHEST WITH CONTRAST TECHNIQUE: Multidetector CT imaging of the chest was performed using the standard protocol during bolus administration of intravenous contrast. Multiplanar CT image reconstructions and MIPs were obtained to evaluate the vascular anatomy. CONTRAST:  149m OMNIPAQUE IOHEXOL 350 MG/ML SOLN COMPARISON:  Chest radiograph earlier this day. Chest CT 05/13/2018 FINDINGS: Cardiovascular: There are no filling defects within the pulmonary arteries to suggest pulmonary embolus. Atherosclerosis of the thoracic aorta without aneurysm or dissection. No evidence of acute aortic syndrome. Coronary artery calcifications. Heart is normal in size. No pericardial effusion. Mediastinum/Nodes: Small mediastinal nodes are not enlarged by size criteria. No hilar adenopathy. Improved esophageal wall thickening from prior. No  dominant thyroid nodule. Lungs/Pleura: Dependent linear opacities may be combination of atelectasis and scarring. Mild emphysema. Pulmonary edema or acute airspace disease. No pleural fluid. Trachea and mainstem bronchi are patent. Upper Abdomen: No acute findings. Musculoskeletal: Remote lower sternal fracture. There are no acute or suspicious osseous abnormalities. Review of the MIP images confirms the above findings. IMPRESSION: 1. No pulmonary embolus. Bibasilar atelectasis or scarring. No acute intrathoracic abnormality. 2. Coronary artery calcifications. Aortic Atherosclerosis (ICD10-I70.0). Electronically Signed   By: MKeith RakeM.D.   On: 04/07/2019 19:24   Dg Chest Port 1 View  Result Date: 04/11/2019 CLINICAL DATA:  Shortness of breath. EXAM: PORTABLE CHEST 1 VIEW COMPARISON:  Chest x-ray dated April 09, 2019. FINDINGS: The heart size and mediastinal contours are within normal limits. Normal pulmonary vascularity. Atherosclerotic calcification of the aortic arch. Improved aeration of the right lower lobe with trace residual pleural effusion. Bandlike atelectasis at the peripheral left lung base. No consolidation or pneumothorax. No acute osseous abnormality. IMPRESSION: 1. Improved aeration of the right lower lobe with trace residual pleural effusion. 2. Persistent left basilar subsegmental atelectasis. Electronically Signed   By: WTitus DubinM.D.   On: 04/11/2019 08:15   Dg Chest Port 1 View  Result Date: 04/09/2019 CLINICAL DATA:  Short of breath EXAM: PORTABLE CHEST 1 VIEW COMPARISON:  04/07/2019 FINDINGS: Progression of mild right lower lobe airspace disease. Left lower lobe atelectasis slightly progressive. Small right effusion. Negative for heart failure. IMPRESSION: Mild progression of bibasilar airspace disease  right greater than left and small right effusion. Possible pneumonia versus atelectasis. Electronically Signed   By: Franchot Gallo M.D.   On: 04/09/2019 10:53   Dg Chest  Portable 1 View  Result Date: 04/07/2019 CLINICAL DATA:  Patient reports shortness of breath, non-productive cough, fever onset yesterday and over the night. EXAM: PORTABLE CHEST 1 VIEW COMPARISON:  Chest x-ray dated 10/01/2018 FINDINGS: Heart size and mediastinal contours are within normal limits. Lungs are clear. No pleural effusion or pneumothorax seen. Osseous structures about the chest are unremarkable. IMPRESSION: No active disease. No evidence of pneumonia or pulmonary edema. Electronically Signed   By: Franki Cabot M.D.   On: 04/07/2019 17:53   ECHOCARDIOGRAM IMPRESSIONS    1. The left ventricle has normal systolic function, with an ejection fraction of 60-65%. The cavity size was normal. Left ventricular diastolic Doppler parameters are consistent with impaired relaxation.  2. The mitral valve is grossly normal.  3. The tricuspid valve was grossly normal.  4. The aortic valve is tricuspid Aortic valve regurgitation was not assessed by color flow Doppler. No stenosis of the aortic valve.  5. Limited study for LV function; full doppler not performed; normal LV function; mild diastolic dysfunction.  FINDINGS  Left Ventricle: The left ventricle has normal systolic function, with an ejection fraction of 60-65%. The cavity size was normal. There is no increase in left ventricular wall thickness. Left ventricular diastolic Doppler parameters are consistent with  impaired relaxation (grade I).   Right Ventricle: The right ventricle has normal systolic function. The cavity was normal. There is right vetricular wall thickness was not assessed. Left Atrium: Left atrial size was normal in size. Right Atrium: Right atrial size was normal in size. Right atrial pressure is estimated at 10 mmHg.   Pericardium: There is no evidence of pericardial effusion. Mitral Valve: The mitral valve is grossly normal. Mitral valve regurgitation was not assessed by color flow Doppler. Tricuspid Valve: The  tricuspid valve was grossly normal. Tricuspid valve regurgitation is trivial by color flow Doppler. Aortic Valve: The aortic valve is tricuspid Aortic valve regurgitation was not assessed by color flow Doppler. There is No stenosis of the aortic valve. Pulmonic Valve: The pulmonic valve was grossly normal. Pulmonic valve regurgitation was not assessed by color flow Doppler. Venous: The inferior vena cava is normal in size with greater than 50% respiratory variability.   LEFT VENTRICLE PLAX 2D                Diastology LVIDd:         3.70 cm LV e' lateral:   11.00 cm/s LVIDs:         2.40 cm LV E/e' lateral: 6.5 LV SV:         38 ml   LV e' medial:    6.20 cm/s LV SV Index:   23.77   LV E/e' medial:  11.6  LEFT ATRIUM             Index LA diam:        3.50 cm 2.20 cm/m LA Vol (A2C):   29.9 ml 18.82 ml/m LA Vol (A4C):   14.8 ml 9.32 ml/m LA Biplane Vol: 21.0 ml 13.22 ml/m    AORTA Ao Root diam: 2.80 cm  MITRAL VALVE MV Area (PHT): 2.76 cm MV PHT:        79.75 msec MV Decel Time: 275 msec MV E velocity: 72.00 cm/s MV A velocity: 94.70 cm/s MV E/A ratio:  0.76  Subjective: Seen And examined at bedside and her respiratory symptoms have improved.  Denied any chest pain, lightheadedness or dizziness.  No shortness of breath today.  Her only complaint was some fatigue some back pain when she ambulated.  No other concerns or complaints at this time and I discussed the case with cardiology Dr. Katha Cabal who states the patient is stable from a cardiac standpoint to be discharged home and they will follow her up in outpatient setting within 1 to 2 weeks.  Patient is to have an outpatient coronary CTA or a stress test once her COVID Testing results.  Currently her COVID-19 testing is still pending.  She Needs to follow-up with PCP and Cardiology in the outpatient setting and the plan of care was discussed with the patient and the patient is understanding and agreeable with the plan and  she states that she would go home and isolate.  Discharge Exam: Vitals:   04/10/19 2343 04/11/19 0748  BP: 118/69 103/68  Pulse: 92   Resp:  18  Temp: 98.2 F (36.8 C)   SpO2: 94% 93%   Vitals:   04/10/19 0000 04/10/19 0804 04/10/19 2343 04/11/19 0748  BP:  127/79 118/69 103/68  Pulse: 83 65 92   Resp: '18 16  18  ' Temp:  98.7 F (37.1 C) 98.2 F (36.8 C)   TempSrc: Oral Oral Oral   SpO2: 97% 98% 94% 93%  Weight:      Height:       General: Pt is alert, awake, not in acute distress Cardiovascular: RRR, S1/S2 +, no rubs, no gallops Respiratory: Diminished bilaterally, no wheezing, no rhonchi; Unlabored Breathing  Abdominal: Soft, NT, ND, bowel sounds + Extremities: no LE edema, no cyanosis  The results of significant diagnostics from this hospitalization (including imaging, microbiology, ancillary and laboratory) are listed below for reference.    Microbiology: Recent Results (from the past 240 hour(s))  Blood culture (routine x 2)     Status: None (Preliminary result)   Collection Time: 04/07/19  5:00 PM  Result Value Ref Range Status   Specimen Description BLOOD LEFT WRIST  Final   Special Requests   Final    BOTTLES DRAWN AEROBIC AND ANAEROBIC Blood Culture adequate volume   Culture   Final    NO GROWTH 4 DAYS Performed at [facility], [address], [place], [place] [address]    Report Status PENDING  Incomplete  Blood culture (routine x 2)     Status: None (Preliminary result)   Collection Time: 04/07/19  5:00 PM  Result Value Ref Range Status   Specimen Description BLOOD RIGHT ANTECUBITAL  Final   Special Requests   Final    BOTTLES DRAWN AEROBIC AND ANAEROBIC Blood Culture results may not be optimal due to an inadequate volume of blood received in culture bottles   Culture   Final    NO GROWTH 4 DAYS Performed at [facility], [place] [address], [place], [place] [address]    Report Status PENDING  Incomplete  Respiratory Panel by PCR     Status:  None   Collection Time: 04/08/19  4:36 PM  Result Value Ref Range Status   Adenovirus NOT DETECTED NOT DETECTED Final   Coronavirus 229E NOT DETECTED NOT DETECTED Final    Comment: (NOTE) The Coronavirus on the Respiratory Panel, DOES NOT test for the novel  Coronavirus (2019 nCoV)    Coronavirus HKU1 NOT DETECTED NOT DETECTED Final   Coronavirus  NL63 NOT DETECTED NOT DETECTED Final   Coronavirus OC43 NOT DETECTED NOT DETECTED Final   Metapneumovirus NOT DETECTED NOT DETECTED Final   Rhinovirus / Enterovirus NOT DETECTED NOT DETECTED Final   Influenza A NOT DETECTED NOT DETECTED Final   Influenza B NOT DETECTED NOT DETECTED Final   Parainfluenza Virus 1 NOT DETECTED NOT DETECTED Final   Parainfluenza Virus 2 NOT DETECTED NOT DETECTED Final   Parainfluenza Virus 3 NOT DETECTED NOT DETECTED Final   Parainfluenza Virus 4 NOT DETECTED NOT DETECTED Final   Respiratory Syncytial Virus NOT DETECTED NOT DETECTED Final   Bordetella pertussis NOT DETECTED NOT DETECTED Final   Chlamydophila pneumoniae NOT DETECTED NOT DETECTED Final   Mycoplasma pneumoniae NOT DETECTED NOT DETECTED Final    Comment: Performed at [facility], [place] [address] [place], [place] [address]  MRSA PCR Screening     Status: None   Collection Time: 04/08/19  6:22 PM  Result Value Ref Range Status   MRSA by PCR NEGATIVE NEGATIVE Final    Comment:        The GeneXpert MRSA Assay (FDA approved for NASAL specimens only), is one component of a comprehensive MRSA colonization surveillance program. It is not intended to diagnose MRSA infection nor to guide or monitor treatment for MRSA infections. Performed at [facility], [place] [address], [place], [place] [address]     Labs: BNP (last 3 results) Recent Labs    04/08/19 0238  BNP 329.1*   Basic Metabolic Panel: Recent Labs  Lab 04/07/19 1700 04/08/19 0238 04/09/19 0615 04/10/19 1035 04/11/19 0518  NA 135 138 140 140 141  K 3.5 3.4* 3.8 3.7  3.7  CL 93* 103 107 106 101  CO2 '29 24 24 25 28  ' GLUCOSE 114* 96 85 93 91  BUN 5* 6* <5* <5* <5*  CREATININE 1.00 0.87 0.56 0.60 0.74  CALCIUM 9.1 7.6* 7.9* 8.4* 8.7*  MG  --  1.9 1.7 1.8 2.0  PHOS  --  3.5 2.2* 2.9 4.9*   Liver Function Tests: Recent Labs  Lab 04/07/19 1700 04/08/19 0238 04/09/19 0615 04/10/19 1035 04/11/19 0518  AST '25 20 18 17 17  ' ALT '11 8 8 8 8  ' ALKPHOS 95 66 59 70 63  BILITOT 0.6 0.6 0.4 0.7 0.7  PROT 6.7 5.0* 4.8* 5.5* 5.9*  ALBUMIN 3.9 2.8* 2.7* 3.0* 3.1*   Recent Labs  Lab 04/07/19 1700  LIPASE 25   No results for input(s): AMMONIA in the last 168 hours. CBC: Recent Labs  Lab 04/07/19 1700 04/08/19 0238 04/09/19 0615 04/10/19 0245 04/11/19 0518  WBC 10.7* 10.7* 8.8 9.6 6.9  NEUTROABS 6.1  --  3.9  --  3.0  HGB 13.4 10.6* 10.4* 11.2* 11.9*  HCT 42.2 33.1* 33.6* 35.4* 36.8  MCV 102.7* 100.6* 103.1* 100.9* 101.7*  PLT 295 230 224 225 231   Cardiac Enzymes: Recent Labs  Lab 04/07/19 1700 04/07/19 2007 04/08/19 0238 04/08/19 1111  CKTOTAL  --   --   --  72  TROPONINI 1.07* 1.11* 0.83*  --    BNP: Invalid input(s): POCBNP CBG: No results for input(s): GLUCAP in the last 168 hours. D-Dimer Recent Labs    04/10/19 1035 04/11/19 0518  DDIMER 0.44 0.58*   Hgb A1c No results for input(s): HGBA1C in the last 72 hours. Lipid Profile Recent Labs    04/09/19 0615  CHOL 128  HDL 47  LDLCALC 71  TRIG 52  CHOLHDL  2.7   Thyroid function studies No results for input(s): TSH, T4TOTAL, T3FREE, THYROIDAB in the last 72 hours.  Invalid input(s): FREET3 Anemia work up Recent Labs    04/08/19 2114 04/11/19 0518  VITAMINB12  --  933*  FOLATE  --  27.0  FERRITIN 212 206  TIBC  --  224*  IRON  --  123  RETICCTPCT  --  4.0*   Urinalysis    Component Value Date/Time   COLORURINE YELLOW 04/07/2019 2040   APPEARANCEUR HAZY (A) 04/07/2019 2040   LABSPEC 1.030 04/07/2019 2040   PHURINE 6.0 04/07/2019 2040   GLUCOSEU NEGATIVE  04/07/2019 2040   GLUCOSEU NEGATIVE 12/22/2017 1650   HGBUR NEGATIVE 04/07/2019 2040   BILIRUBINUR NEGATIVE 04/07/2019 2040   BILIRUBINUR neg 02/07/2016 1627   KETONESUR NEGATIVE 04/07/2019 2040   PROTEINUR NEGATIVE 04/07/2019 2040   UROBILINOGEN 0.2 12/22/2017 1650   NITRITE NEGATIVE 04/07/2019 2040   LEUKOCYTESUR NEGATIVE 04/07/2019 2040   Sepsis Labs Invalid input(s): PROCALCITONIN,  WBC,  LACTICIDVEN Microbiology Recent Results (from the past 240 hour(s))  Blood culture (routine x 2)     Status: None (Preliminary result)   Collection Time: 04/07/19  5:00 PM  Result Value Ref Range Status   Specimen Description BLOOD LEFT WRIST  Final   Special Requests   Final    BOTTLES DRAWN AEROBIC AND ANAEROBIC Blood Culture adequate volume   Culture   Final    NO GROWTH 4 DAYS Performed at [facility], [place] [address] [place], [place] [address]    Report Status PENDING  Incomplete  Blood culture (routine x 2)     Status: None (Preliminary result)   Collection Time: 04/07/19  5:00 PM  Result Value Ref Range Status   Specimen Description BLOOD RIGHT ANTECUBITAL  Final   Special Requests   Final    BOTTLES DRAWN AEROBIC AND ANAEROBIC Blood Culture results may not be optimal due to an inadequate volume of blood received in culture bottles   Culture   Final    NO GROWTH 4 DAYS Performed at [facility], [place] [address] [place], [place] [address]    Report Status PENDING  Incomplete  Respiratory Panel by PCR     Status: None   Collection Time: 04/08/19  4:36 PM  Result Value Ref Range Status   Adenovirus NOT DETECTED NOT DETECTED Final   Coronavirus 229E NOT DETECTED NOT DETECTED Final    Comment: (NOTE) The Coronavirus on the Respiratory Panel, DOES NOT test for the novel  Coronavirus (2019 nCoV)    Coronavirus HKU1 NOT DETECTED NOT DETECTED Final   Coronavirus NL63 NOT DETECTED NOT DETECTED Final   Coronavirus OC43 NOT DETECTED NOT DETECTED Final   Metapneumovirus  NOT DETECTED NOT DETECTED Final   Rhinovirus / Enterovirus NOT DETECTED NOT DETECTED Final   Influenza A NOT DETECTED NOT DETECTED Final   Influenza B NOT DETECTED NOT DETECTED Final   Parainfluenza Virus 1 NOT DETECTED NOT DETECTED Final   Parainfluenza Virus 2 NOT DETECTED NOT DETECTED Final   Parainfluenza Virus 3 NOT DETECTED NOT DETECTED Final   Parainfluenza Virus 4 NOT DETECTED NOT DETECTED Final   Respiratory Syncytial Virus NOT DETECTED NOT DETECTED Final   Bordetella pertussis NOT DETECTED NOT DETECTED Final   Chlamydophila pneumoniae NOT DETECTED NOT DETECTED Final   Mycoplasma pneumoniae NOT DETECTED NOT DETECTED Final    Comment: Performed at [facility], [place] [address] [place], [place] [address]  MRSA PCR Screening     Status: None   Collection Time: 04/08/19  6:22 PM  Result Value Ref Range Status   MRSA by PCR NEGATIVE NEGATIVE Final    Comment:        The GeneXpert MRSA Assay (FDA approved for NASAL specimens only), is one component of a comprehensive MRSA colonization surveillance program. It is not intended to diagnose MRSA infection nor to guide or monitor treatment for MRSA infections. Performed at [facility], [place] [address], [place],  [address]    Time coordinating discharge: 35 minutes  SIGNED:  Kerney Elbe, DO [facility] 04/11/2019, 1:32 PM Pager is on [place]  If 7PM-7AM, please contact night-coverage [email or web address] Password [email or web address]

## 2019-04-11 NOTE — Consult Note (Signed)
   [facility] Inpatient Consult   04/11/2019  Denise King [birth year] [id number]    Patient reviewed for potential [facility] Care Management services as benefit from [facility] plan witha16% (medium) unplanned readmission and hospitalization risk score. Noted that she was previously outreached by [facility] telephonic care management but not currently active.  Patient's chartreviewedandrevealedas follows: Patient was admitted for further assessment and management of 6- week history of diarrhea, loss of appetite, nausea/vomiting as well as fever, chills, shortness of breath, sore throat and some URI symptoms (with Covid-19 result still pending); developed chest pressure and seen by cardiology; with CT of chest revealing bibasilar atelectasis.  Per history and physical on 04/07/19, patient is a 68 year old female with past medical history significant for irritable bowel syndrome ([name]), tobacco use disorder (over 40 pack years), chronic bronchitis, alcohol dependence, chronic pain amongst other medical problems.   Her primary care provider is Dr. Cathlean Cower with [facility] [place] care provider's office is listed as providing transition of care.  Called and spoke to transition of care CM for possible discharge needs but states that currently has none identified. Was able to talk to patient over the phone and denied barriers for medication, pharmacy, transportation. She reportsliving at home with husband and daughter who provide support and assistance which also includes other family members.    Patient agreed for [facility] to follow-up with hercontinued recovery post hospitalization. Referral made for [facility]MI COPD calls to follow-up after discharge.   For questions and referrals, please contact:  Edwena Felty A. Danta Baumgardner, BSN, RN-BC [facility] Liaison Cell: [phone]

## 2019-04-12 ENCOUNTER — Telehealth: Payer: Self-pay | Admitting: *Deleted

## 2019-04-12 LAB — CULTURE, BLOOD (ROUTINE X 2)
Culture: NO GROWTH
Culture: NO GROWTH
Special Requests: ADEQUATE

## 2019-04-12 NOTE — Telephone Encounter (Signed)
Transition Care Management Follow-up Telephone Call   Date discharged? 04/11/19   How have you been since you were released from the hospital? Pt states she is feeling ok   Do you understand why you were in the hospital? YES   Do you understand the discharge instructions? YES   Where were you discharged to? Home   Items Reviewed:  Medications reviewed: YES, no changes  Allergies reviewed: YES  Dietary changes reviewed: YES, heart healthy and low salt diet  Referrals reviewed: YES, pt states she still need to make f/u appt w/her cardiologist will try to call tomorrow   Functional Questionnaire:   Activities of Daily Living (ADLs):   She states she are independent in the following: ambulation, bathing and hygiene, feeding, continence, grooming, toileting and dressing States she doesn't require assistance    Any transportation issues/concerns?: NO   Any patient concerns? YES, pt is aking for the results back from the COVID test. Inform pt per her chart results has not came back. Can take up to 5-7 days before getting the results back.    Confirmed importance and date/time of follow-up visits scheduled YES,(virtual)  04/14/19  Provider Appointment booked with Dr Jenny Reichmann   Confirmed with patient if condition begins to worsen call PCP or go to the ER.  Patient was given the office number and encouraged to call back with question or concerns.  : YES

## 2019-04-14 ENCOUNTER — Ambulatory Visit (INDEPENDENT_AMBULATORY_CARE_PROVIDER_SITE_OTHER): Payer: Medicare HMO | Admitting: Internal Medicine

## 2019-04-14 ENCOUNTER — Encounter: Payer: Self-pay | Admitting: Internal Medicine

## 2019-04-14 DIAGNOSIS — B349 Viral infection, unspecified: Secondary | ICD-10-CM

## 2019-04-14 DIAGNOSIS — R079 Chest pain, unspecified: Secondary | ICD-10-CM | POA: Diagnosis not present

## 2019-04-14 DIAGNOSIS — R0602 Shortness of breath: Secondary | ICD-10-CM

## 2019-04-14 NOTE — Progress Notes (Signed)
Patient ID: Denise King, female   DOB: 08/01/1951, 68 y.o.   MRN: [id number]  Virtual Visit via Video Note  I connected with Denise King on 04/14/19 at  1:00 PM EDT by a video enabled telemedicine application and verified that I am speaking with the correct person using two identifiers.   I discussed the limitations of evaluation and management by telemedicine and the availability of in person appointments. The patient expressed understanding and agreed to proceed.  History of Present Illness: Patient is a 68 year old female with past medical history significant for irritable bowel syndrome, tobacco use disorder (over 40 pack years), chronic bronchitis, alcohol dependence, chronic pain amongst other medical problems. Patient presents with 6-week history of diarrhea. Patient reports daily bowel movement that is up to 8 times daily, and said to be watery.  According to the patient, she has not had appetite, has not eaten much in the last few days and has been feeling generally unwell. She developed nausea and vomiting, as well as fever, chills, shortness of breath, sore throat and some URI symptoms. Patient developed chest pressure as well, said to be constant, and only got relief to him when patient was put on oxygen on presentation to the hospital.  Pt with extensive lab and imaging (see d/c summary), also tx briefly only with azithro/rocephin.  Blood cx neg, resp panel neg, covid19 testing still pending.   Recommendations for Outpatient Follow-up:  1. Follow up with PCP in 1-2 weeks 2. Follow up with Cardiology Dr. Johnsie Cancel within 1-2 weeks;  3. Please obtain CMP/CBC, Mag, Phos in one week 4. Repeat CXR in 3-6 weeks 5. Please follow up on the following pending results: COVID-19 TESTING Today, Pt denies chest pain, increased sob or doe, wheezing, orthopnea, PND, increased LE swelling, palpitations, dizziness or syncope.  Pt denies new neurological symptoms such as new headache, or  facial or extremity weakness or numbness   Pt denies polydipsia, polyuria,.   Pt denies fever, wt loss, night sweats, or other constitutional symptoms except for loss of appetite   Does c/o ongoing fatigue, but denies signficant daytime hypersomnolence.  No diarrhea.  No new  Complaints Past Medical History:  Diagnosis Date  . Acute cystitis   . Acute respiratory failure ([place])   . Allergy    as a child grew out of them  . Anemia    pernicious anemia  . Anxiety   . Arthritis   . Back pain   . Blood transfusion   . CAP (community acquired pneumonia)   . Chronic pain syndrome   . Depression   . Diverticulosis of colon   . Dysthymia   . Esophagitis   . EtOH dependence ([place])   . Gastritis   . GERD (gastroesophageal reflux disease)   . H/O chest pain Dec. 2013   no work up done  . Hx of colonic polyps   . Hypertension   . Irritable bowel syndrome   . Osteopenia   . Osteoporosis   . Pneumonia 2019  . Polypharmacy   . Reflux esophagitis   . Right sided sciatica 12/22/2017  . Tobacco use disorder   . Unspecified chronic bronchitis ([place])   . Vertigo   . Vitamin B12 deficiency   . Wears glasses    Past Surgical History:  Procedure Laterality Date  . APPENDECTOMY    . BACK SURGERY  10-09   Dr. Patrice Paradise  . CARPAL TUNNEL RELEASE Right 08/14/2014   Procedure: RIGHT CARPAL TUNNEL RELEASE  AND INJECT LEFT THUMB;  Surgeon: Daryll Brod, MD;  Location: [facility];  Service: Orthopedics;  Laterality: Right;  . COLONOSCOPY    . INTRAMEDULLARY (IM) NAIL INTERTROCHANTERIC Right 10/01/2018   Procedure: INTRAMEDULLARY (IM) NAIL INTERTROCHANTRIC;  Surgeon: Thornton Park, MD;  Location: [facility];  Service: Orthopedics;  Laterality: Right;  . LAPAROSCOPY N/A 02/21/2013   Procedure: LAPAROSCOPY OPERATIVE;  Surgeon: Margarette Asal, MD;  Location: [facility];  Service: Gynecology;  Laterality: N/A;  REQUESTING 5MM SCOPE WITH CAMERA  . TONSILLECTOMY    . TUBAL LIGATION    . UPPER  GASTROINTESTINAL ENDOSCOPY      reports that she has been smoking cigarettes. She has a 30.00 pack-year smoking history. She has never used smokeless tobacco. She reports current alcohol use of about 3.0 standard drinks of alcohol per week. She reports that she does not use drugs. family history includes Alcohol abuse in her daughter; Bipolar disorder in her daughter; Colon cancer in her brother and father; Drug abuse in her daughter; Heart disease in her mother; Prostate cancer in her father; Skin cancer in her sister. Allergies  Allergen Reactions  . Atorvastatin Nausea And Vomiting  . Levofloxacin Other (See Comments)    Reaction: achilles tendon pain  . Omnicef [Cefdinir] Diarrhea    Uncontrollable diarrhea  . Trazodone And Nefazodone Other (See Comments)    "Felt like I was going to faint"  . Sulfa Antibiotics Rash  . Sulfonamide Derivatives Rash   Current Outpatient Medications on File Prior to Visit  Medication Sig Dispense Refill  . ARIPiprazole (ABILIFY) 2 MG tablet Take 1 tablet (2 mg total) by mouth daily for 30 days. 30 tablet 0  . aspirin 81 MG chewable tablet Chew 1 tablet (81 mg total) by mouth daily. 30 tablet 0  . cholestyramine (QUESTRAN) 4 GM/DOSE powder Take 1 packet (4 g total) by mouth 2 (two) times daily with a meal. (Patient taking differently: Take 4 g by mouth 2 (two) times daily as needed (diarrhea). ) 378 g 12  . clonazePAM (KLONOPIN) 1 MG tablet Take 1 tablet (1 mg total) by mouth at bedtime. (Patient taking differently: Take 1 mg by mouth daily as needed for anxiety. ) 90 tablet 0  . denosumab (PROLIA) 60 MG/ML SOSY injection Inject 60 mg into the skin every 6 (six) months.    . folic acid (FOLVITE) 1 MG tablet Take 1 tablet (1 mg total) by mouth daily. 90 tablet 3  . hyoscyamine (ANASPAZ) 0.125 MG TBDP disintergrating tablet Place 0.125 mg under the tongue every 6 (six) hours as needed ([name] cramps).     . Ipratropium-Albuterol (COMBIVENT) 20-100 MCG/ACT AERS  respimat Inhale 1 puff into the lungs every 6 (six) hours as needed for wheezing or shortness of breath. 1 Inhaler 0  . morphine (MS CONTIN) 30 MG 12 hr tablet Take 30 mg by mouth every 8 (eight) hours.    . Oxycodone HCl 10 MG TABS Take 10 mg by mouth every 4 (four) hours.     . pantoprazole (PROTONIX) 40 MG tablet Take 1 tablet (40 mg total) by mouth daily. 30 tablet 0  . sertraline (ZOLOFT) 100 MG tablet Take 1.5 tablets (150 mg total) by mouth daily. (Patient taking differently: Take 150 mg by mouth at bedtime. ) 135 tablet 0  . triamcinolone cream (KENALOG) 0.1 % Apply 1 application topically 2 (two) times daily as needed (itching).     . vitamin B-12 (CYANOCOBALAMIN) 100 MCG tablet Take 1  tablet (100 mcg total) by mouth daily. 90 tablet 3   No current facility-administered medications on file prior to visit.    Observations/Objective: Alert, mentating well, fatigued, non toxic, resp normal, cn 2-12 intact, moves all 4s, no visible rash or swelling Lab Results  Component Value Date   WBC 6.9 04/11/2019   HGB 11.9 (L) 04/11/2019   HCT 36.8 04/11/2019   PLT 231 04/11/2019   GLUCOSE 91 04/11/2019   CHOL 128 04/09/2019   TRIG 52 04/09/2019   HDL 47 04/09/2019   LDLDIRECT 102.4 05/24/2008   LDLCALC 71 04/09/2019   ALT 8 04/11/2019   AST 17 04/11/2019   NA 141 04/11/2019   K 3.7 04/11/2019   CL 101 04/11/2019   CREATININE 0.74 04/11/2019   BUN <5 (L) 04/11/2019   CO2 28 04/11/2019   TSH 0.499 04/08/2019   INR 0.82 10/19/2018   HGBA1C 5.2 10/01/2018   Assessment and Plan: See notes  Follow Up Instructions: See notes   I discussed the assessment and treatment plan with the patient. The patient was provided an opportunity to ask questions and all were answered. The patient agreed with the plan and demonstrated an understanding of the instructions.   The patient was advised to call back or seek an in-person evaluation if the symptoms worsen or if the condition fails to improve  as anticipated.   Cathlean Cower, MD

## 2019-04-14 NOTE — Assessment & Plan Note (Addendum)
Back to baseline per pt,  to f/u any worsening symptoms or concerns, cont same tx, for f/u cxr in 3 wks

## 2019-04-14 NOTE — Assessment & Plan Note (Signed)
Overall much improved with residual weakness only, f/u covid 19 results

## 2019-04-14 NOTE — Assessment & Plan Note (Signed)
Also for lab testing early next week

## 2019-04-14 NOTE — Patient Instructions (Signed)
Please continue all other medications as before, and refills have been done if requested.  Please have the pharmacy call with any other refills you may need.  Please continue your efforts at being more active, low cholesterol diet, and weight control.  You are otherwise up to date with prevention measures today.  Please keep your appointments with your specialists as you may have planned  Please go to the LAB in the Basement (turn left off the elevator) for the tests to be done early next week  Please go to the XRAY Department in the Basement (go straight as you get off the elevator) for the x-ray testing in 3 weeks  You will be contacted by phone if any changes need to be made immediately.  Otherwise, you will receive a letter about your results with an explanation, but please check with MyChart first.  Please remember to sign up for MyChart if you have not done so, as this will be important to you in the future with finding out test results, communicating by private email, and scheduling acute appointments online when needed.

## 2019-04-14 NOTE — Assessment & Plan Note (Signed)
Pt not yet heard from cardiology, asking for referral, will likely need further evaluation

## 2019-04-17 ENCOUNTER — Ambulatory Visit (INDEPENDENT_AMBULATORY_CARE_PROVIDER_SITE_OTHER): Payer: Medicare HMO | Admitting: Psychiatry

## 2019-04-17 ENCOUNTER — Encounter (HOSPITAL_COMMUNITY): Payer: Self-pay | Admitting: Psychiatry

## 2019-04-17 ENCOUNTER — Other Ambulatory Visit: Payer: Self-pay

## 2019-04-17 DIAGNOSIS — R69 Illness, unspecified: Secondary | ICD-10-CM | POA: Diagnosis not present

## 2019-04-17 DIAGNOSIS — F411 Generalized anxiety disorder: Secondary | ICD-10-CM | POA: Diagnosis not present

## 2019-04-17 DIAGNOSIS — F341 Dysthymic disorder: Secondary | ICD-10-CM

## 2019-04-17 NOTE — Progress Notes (Signed)
Virtual Visit via Video Note  I connected with Denise King on 04/17/19 at  2:30 PM EDT by a video enabled telemedicine application and verified that I am speaking with the correct person using two identifiers.   I discussed the limitations of evaluation and management by telemedicine and the availability of in person appointments. The patient expressed understanding and agreed to proceed.  History of Present Illness: Dysthymia and Generalized Anxiety Disorder due to complex medical issues, forced retirement, and marital/familial relationship conflicts.   Observations/Objective: Counselor met with Denise King for individual therapy via Webex. Counselor assessed Denise King mental health symptoms. Denise King reported that she had been hospitalized for a heart attack last week. Counselor processed the experience with her as she expressed her anxieties and concerns. Denise King reported that things have calmed down now, but that she was having anxiety over what this means for her overall health moving forward. Counselor discussed coping skills and promoted self-care. We discussed her daughter moving back home and how that has impacted her stress level.  Assessment and Plan: Counselor and Denise King will meet again in 2 weeks to follow up on progress on treatment plan goals. Counselor is recommending that Denise King focuses on basic care needs, such as eating, drinking fluids, resting, sleeping, expressing boundaries with daughter and communicating health concerns with daughters/nurses.   Follow Up Instructions: Counselor will set up Webex for next session.    I discussed the assessment and treatment plan with the patient. The patient was provided an opportunity to ask questions and all were answered. The patient agreed with the plan and demonstrated an understanding of the instructions.   The patient was advised to call back or seek an in-person evaluation if the symptoms worsen or if the condition fails to improve as  anticipated.  I provided 40 minutes of non-face-to-face time during this encounter.   Lise Auer, LCSW

## 2019-04-19 ENCOUNTER — Telehealth (HOSPITAL_COMMUNITY): Payer: Self-pay

## 2019-04-19 ENCOUNTER — Other Ambulatory Visit (HOSPITAL_COMMUNITY): Payer: Self-pay

## 2019-04-19 ENCOUNTER — Other Ambulatory Visit (INDEPENDENT_AMBULATORY_CARE_PROVIDER_SITE_OTHER): Payer: Medicare HMO

## 2019-04-19 DIAGNOSIS — R079 Chest pain, unspecified: Secondary | ICD-10-CM

## 2019-04-19 DIAGNOSIS — R0602 Shortness of breath: Secondary | ICD-10-CM

## 2019-04-19 DIAGNOSIS — B349 Viral infection, unspecified: Secondary | ICD-10-CM | POA: Diagnosis not present

## 2019-04-19 LAB — CBC WITH DIFFERENTIAL/PLATELET
Basophils Absolute: 0.1 10*3/uL (ref 0.0–0.1)
Basophils Relative: 1.2 % (ref 0.0–3.0)
Eosinophils Absolute: 0.1 10*3/uL (ref 0.0–0.7)
Eosinophils Relative: 1 % (ref 0.0–5.0)
HCT: 43.4 % (ref 36.0–46.0)
Hemoglobin: 14.4 g/dL (ref 12.0–15.0)
Lymphocytes Relative: 37.5 % (ref 12.0–46.0)
Lymphs Abs: 3.6 10*3/uL (ref 0.7–4.0)
MCHC: 33.3 g/dL (ref 30.0–36.0)
MCV: 99.9 fl (ref 78.0–100.0)
Monocytes Absolute: 0.8 10*3/uL (ref 0.1–1.0)
Monocytes Relative: 7.8 % (ref 3.0–12.0)
Neutro Abs: 5.1 10*3/uL (ref 1.4–7.7)
Neutrophils Relative %: 52.5 % (ref 43.0–77.0)
Platelets: 423 10*3/uL — ABNORMAL HIGH (ref 150.0–400.0)
RBC: 4.34 Mil/uL (ref 3.87–5.11)
RDW: 13.8 % (ref 11.5–15.5)
WBC: 9.7 10*3/uL (ref 4.0–10.5)

## 2019-04-19 LAB — HEPATIC FUNCTION PANEL
ALT: 7 U/L (ref 0–35)
AST: 14 U/L (ref 0–37)
Albumin: 3.7 g/dL (ref 3.5–5.2)
Alkaline Phosphatase: 98 U/L (ref 39–117)
Bilirubin, Direct: 0.1 mg/dL (ref 0.0–0.3)
Total Bilirubin: 0.5 mg/dL (ref 0.2–1.2)
Total Protein: 7 g/dL (ref 6.0–8.3)

## 2019-04-19 LAB — BASIC METABOLIC PANEL
BUN: 4 mg/dL — ABNORMAL LOW (ref 6–23)
CO2: 31 mEq/L (ref 19–32)
Calcium: 8.9 mg/dL (ref 8.4–10.5)
Chloride: 100 mEq/L (ref 96–112)
Creatinine, Ser: 0.67 mg/dL (ref 0.40–1.20)
GFR: 88.09 mL/min (ref >60.00)
Glucose, Bld: 102 mg/dL — ABNORMAL HIGH (ref 70–99)
Potassium: 4.2 mEq/L (ref 3.5–5.1)
Sodium: 139 mEq/L (ref 135–145)

## 2019-04-19 LAB — MAGNESIUM: Magnesium: 2.2 mg/dL (ref 1.5–2.5)

## 2019-04-19 LAB — PHOSPHORUS: Phosphorus: 3.9 mg/dL (ref 2.3–4.6)

## 2019-04-19 MED ORDER — ARIPIPRAZOLE 2 MG PO TABS: 2.0000 mg | ORAL_TABLET | Freq: Every day | ORAL | 0 refills | Status: DC

## 2019-04-25 NOTE — Telephone Encounter (Signed)
Error

## 2019-04-26 ENCOUNTER — Ambulatory Visit: Payer: Medicare HMO | Admitting: Internal Medicine

## 2019-04-26 ENCOUNTER — Other Ambulatory Visit: Payer: Self-pay

## 2019-04-26 ENCOUNTER — Other Ambulatory Visit: Payer: Medicare HMO

## 2019-04-26 ENCOUNTER — Telehealth (INDEPENDENT_AMBULATORY_CARE_PROVIDER_SITE_OTHER): Payer: Medicare HMO | Admitting: Cardiology

## 2019-04-26 ENCOUNTER — Encounter: Payer: Self-pay | Admitting: Cardiology

## 2019-04-26 VITALS — HR 95 | Ht 63.0 in | Wt 121.0 lb

## 2019-04-26 DIAGNOSIS — I2510 Atherosclerotic heart disease of native coronary artery without angina pectoris: Secondary | ICD-10-CM

## 2019-04-26 DIAGNOSIS — R0789 Other chest pain: Secondary | ICD-10-CM

## 2019-04-26 DIAGNOSIS — E782 Mixed hyperlipidemia: Secondary | ICD-10-CM

## 2019-04-26 DIAGNOSIS — R079 Chest pain, unspecified: Secondary | ICD-10-CM

## 2019-04-26 DIAGNOSIS — B349 Viral infection, unspecified: Secondary | ICD-10-CM

## 2019-04-26 DIAGNOSIS — J449 Chronic obstructive pulmonary disease, unspecified: Secondary | ICD-10-CM

## 2019-04-26 NOTE — Progress Notes (Signed)
Virtual Visit via Telephone Note   This visit type was conducted due to national recommendations for restrictions regarding the COVID-19 Pandemic (e.g. social distancing) in an effort to limit this patient's exposure and mitigate transmission in our community.  Due to her co-morbid illnesses, this patient is at least at moderate risk for complications without adequate follow up.  This format is felt to be most appropriate for this patient at this time.  The patient did not have access to video technology/had technical difficulties with video requiring transitioning to audio format only (telephone).  All issues noted in this document were discussed and addressed.  No physical exam could be performed with this format.  Please refer to the patient's chart for her  consent to telehealth for [facility].   Evaluation Performed: [facility]   Date:  04/26/2019   ID:  Denise King, DOB 1951-06-28, MRN [id number]  Patient Location: Home Provider Location: Office  PCP:  Biagio Borg, MD  Cardiologist:  Jenkins Rouge, MD  Electrophysiologist:  None   Chief Complaint:  Post hospital with elevated troponins  History of Present Illness:    Denise King is a 68 y.o. female with hospitalization for acute Viral syndrome and URI symptoms and neg COVID lab test.  Resp panel neg.    She had no history of cardiac disease admitted with 48 hours dry cough and dyspnea then had nausea and vomiting Has had weeks of diarrhea ? Due to [name]. After vomiting had mild central chest pressure now gone. She is weak with malaise Has back, rib and flank pain Denies dysuria She is a ppd smoker active over 40 years. No sick contacts or fever CXR and CT in ER ok. ECG NSR rate 92 totally normal with no signs of ischemia or pericarditis Troponin 1.07 WBC mildly elevated 10.7  Troponin remained flat and echo without RWMA.  She was on IV heparin for 48 hours and neg PE on CTA of chest but + CAD.  In mLAD.  Lipids  were normal in hospital, pt placed on ASA.   No statins yet.   Today Ms. Hush is doing pretty well.  She is on the ASA 81 mg. She is doing some walking and di go to [employer] to walk and buy, did not wear a mask but stayed 6 feet away from others.  She is trying to eat better and not become dehydrated.  She has had virtual visit with her PCP and labs were stable.  She continues to smoke but does wish to stop.  She will call the 1-800 Quit number she has. To see if they can help her.    She does have chest tightness more at rest.  When walking at [employer] she had no heaviness. No associated diaphoresis or nausea.   The patient does not have symptoms concerning for COVID-19 infection (fever, chills, cough, or new shortness of breath).    Past Medical History:  Diagnosis Date   Acute cystitis    Acute respiratory failure ([place])    Allergy    as a child grew out of them   Anemia    pernicious anemia   Anxiety    Arthritis    Back pain    Blood transfusion    CAP (community acquired pneumonia)    Chronic pain syndrome    Depression    Diverticulosis of colon    Dysthymia    Esophagitis    EtOH dependence ([place])  Gastritis    GERD (gastroesophageal reflux disease)    H/O chest pain Dec. 2013   no work up done   Hx of colonic polyps    Hypertension    Irritable bowel syndrome    Osteopenia    Osteoporosis    Pneumonia 2019   Polypharmacy    Reflux esophagitis    Right sided sciatica 12/22/2017   Tobacco use disorder    Unspecified chronic bronchitis ([facility])    Vertigo    Vitamin B12 deficiency    Wears glasses    Past Surgical History:  Procedure Laterality Date   APPENDECTOMY     BACK SURGERY  10-09   Dr. Ricke Hey TUNNEL RELEASE Right 08/14/2014   Procedure: RIGHT CARPAL TUNNEL RELEASE AND INJECT LEFT THUMB;  Surgeon: Daryll Brod, MD;  Location: [place];  Service: Orthopedics;  Laterality: Right;   COLONOSCOPY      INTRAMEDULLARY (IM) NAIL INTERTROCHANTERIC Right 10/01/2018   Procedure: INTRAMEDULLARY (IM) NAIL INTERTROCHANTRIC;  Surgeon: Thornton Park, MD;  Location: [facility];  Service: Orthopedics;  Laterality: Right;   LAPAROSCOPY N/A 02/21/2013   Procedure: LAPAROSCOPY OPERATIVE;  Surgeon: Margarette Asal, MD;  Location: [facility];  Service: Gynecology;  Laterality: N/A;  REQUESTING 5MM SCOPE WITH CAMERA   TONSILLECTOMY     TUBAL LIGATION     UPPER GASTROINTESTINAL ENDOSCOPY       Current Meds  Medication Sig   ARIPiprazole (ABILIFY) 2 MG tablet Take 1 tablet (2 mg total) by mouth daily for 30 days.   aspirin 81 MG chewable tablet Chew 1 tablet (81 mg total) by mouth daily.   cholestyramine (QUESTRAN) 4 GM/DOSE powder Take 1 packet (4 g total) by mouth 2 (two) times daily with a meal.   clonazePAM (KLONOPIN) 1 MG tablet Take 1 mg by mouth daily as needed. daily at bedtime PRN   folic acid (FOLVITE) 1 MG tablet Take 1 tablet (1 mg total) by mouth daily.   hyoscyamine (ANASPAZ) 0.125 MG TBDP disintergrating tablet Place 0.125 mg under the tongue every 6 (six) hours as needed ([name] cramps).    Ipratropium-Albuterol (COMBIVENT) 20-100 MCG/ACT AERS respimat Inhale 1 puff into the lungs every 6 (six) hours as needed for wheezing or shortness of breath.   morphine (MS CONTIN) 30 MG 12 hr tablet Take 30 mg by mouth every 8 (eight) hours.   Oxycodone HCl 10 MG TABS Take 10 mg by mouth every 4 (four) hours.    pantoprazole (PROTONIX) 40 MG tablet Take 1 tablet (40 mg total) by mouth daily.   sertraline (ZOLOFT) 100 MG tablet Take 1.5 tablets (150 mg total) by mouth daily.   triamcinolone cream (KENALOG) 0.1 % Apply 1 application topically 2 (two) times daily as needed (itching).    vitamin B-12 (CYANOCOBALAMIN) 100 MCG tablet Take 1 tablet (100 mcg total) by mouth daily.     Allergies:   Atorvastatin; Levofloxacin; Omnicef [cefdinir]; Trazodone and nefazodone; Sulfa antibiotics; and  Sulfonamide derivatives   Social History   Tobacco Use   Smoking status: Current Every Day Smoker    Packs/day: 1.00    Years: 30.00    Pack years: 30.00    Types: Cigarettes   Smokeless tobacco: Never Used  Substance Use Topics   Alcohol use: Yes    Alcohol/week: 3.0 standard drinks    Types: 3 Standard drinks or equivalent per week    Comment: occasional   Drug use: No     Family  Hx: The patient's family history includes Alcohol abuse in her daughter; Bipolar disorder in her daughter; Colon cancer in her brother and father; Drug abuse in her daughter; Heart disease in her mother; Prostate cancer in her father; Skin cancer in her sister. There is no history of Esophageal cancer, Rectal cancer, or Stomach cancer.  ROS:   Please see the history of present illness.    General:no colds or fevers, no weight changes Skin:no rashes or ulcers HEENT:no blurred vision, no congestion CV:see HPI PUL:see HPI GI:no diarrhea now but hx of [name].  no constipation or melena, no indigestion GU:no hematuria, no dysuria MS:no joint pain, no claudication Neuro:no syncope, no lightheadedness Endo:no diabetes, no thyroid disease hemoc followed by Hematology visit in July.     All other systems reviewed and are negative.   Prior CV studies:   The following studies were reviewed today:  Limited Echo 04/11/19 IMPRESSIONS    1. The left ventricle has normal systolic function, with an ejection fraction of 60-65%. The cavity size was normal. Left ventricular diastolic Doppler parameters are consistent with impaired relaxation.  2. The mitral valve is grossly normal.  3. The tricuspid valve was grossly normal.  4. The aortic valve is tricuspid Aortic valve regurgitation was not assessed by color flow Doppler. No stenosis of the aortic valve.  5. Limited study for LV function; full doppler not performed; normal LV function; mild diastolic dysfunction.  FINDINGS  Left Ventricle: The left  ventricle has normal systolic function, with an ejection fraction of 60-65%. The cavity size was normal. There is no increase in left ventricular wall thickness. Left ventricular diastolic Doppler parameters are consistent with  impaired relaxation (grade I).   Right Ventricle: The right ventricle has normal systolic function. The cavity was normal. There is right vetricular wall thickness was not assessed. Left Atrium: Left atrial size was normal in size. Right Atrium: Right atrial size was normal in size. Right atrial pressure is estimated at 10 mmHg.   Pericardium: There is no evidence of pericardial effusion. Mitral Valve: The mitral valve is grossly normal. Mitral valve regurgitation was not assessed by color flow Doppler. Tricuspid Valve: The tricuspid valve was grossly normal. Tricuspid valve regurgitation is trivial by color flow Doppler. Aortic Valve: The aortic valve is tricuspid Aortic valve regurgitation was not assessed by color flow Doppler. There is No stenosis of the aortic valve. Pulmonic Valve: The pulmonic valve was grossly normal. Pulmonic valve regurgitation was not assessed by color flow Doppler. Venous: The inferior vena cava is normal in size with greater than 50% respiratory variability.     Labs/Other Tests and Data Reviewed:    EKG:  An ECG dated 04/09/19 was personally reviewed today and demonstrated:  SR with T wave inversions V 1 and 2 and some into V3, new from admit EKG on 04/07/19.  Recent Labs: 04/08/2019: B Natriuretic Peptide 449.8; TSH 0.499 04/19/2019: ALT 7; BUN 4; Creatinine, Ser 0.67; Hemoglobin 14.4; Magnesium 2.2; Platelets 423.0; Potassium 4.2; Sodium 139   Recent Lipid Panel Lab Results  Component Value Date/Time   CHOL 128 04/09/2019 06:15 AM   TRIG 52 04/09/2019 06:15 AM   HDL 47 04/09/2019 06:15 AM   CHOLHDL 2.7 04/09/2019 06:15 AM   LDLCALC 71 04/09/2019 06:15 AM   LDLDIRECT 102.4 05/24/2008 10:37 AM    Wt Readings from Last 3  Encounters:  04/26/19 121 lb (54.9 kg)  04/07/19 125 lb 14.4 oz (57.1 kg)  11/03/18 122 lb (55.3 kg)  Objective:    Vital Signs:  Pulse 95    Ht 5\' 3"  (1.6 m)    Wt 121 lb (54.9 kg)    SpO2 96%    BMI 21.43 kg/m    VITAL SIGNS:  reviewed  General strong voice Neuro alert and oriented X 3  Psych: pleasant affect     ASSESSMENT & PLAN:    1. Chest pain, with recent acute viral syndrome and troponin up 1.07; 1.11 and 0.83, fairly flat.  Treated with IV heparin and neg CTA of chest for PE.  Still with chest heaviness at rest at times.  Echo with no RWMA, on CTA of chest she did have coronary calcification, mLAD.  She is better, she did have abnormal EKG.  Will discuss with Dr. Johnsie Cancel on Cardiac CTA vs lexiscan stress test.  And timing. Continue ASA for now 2.  HDL was stable in hospital but this was with diarrhea.  Would add statin for now lipitor 20 mg if Dr. Johnsie Cancel agrees. 3. Acute viral syndrome.  Neg COVID  4. Tobacco use, she would like to stop discussed some ways to stop 5. COPD given ABX and nebs 6. dehydration now stable.. 7. Chronic pain per PCP 8. GERD per PCP   COVID-19 Education: The signs and symptoms of COVID-19 were discussed with the patient and how to seek care for testing (follow up with PCP or arrange E-visit).  The importance of social distancing was discussed today.  Time:   Today, I have spent 15 minutes with the patient with telehealth technology discussing the above problems.     Medication Adjustments/Labs and Tests Ordered: Current medicines are reviewed at length with the patient today.  Concerns regarding medicines are outlined above.   Tests Ordered: No orders of the defined types were placed in this encounter.   Medication Changes: No orders of the defined types were placed in this encounter.   Disposition:  Follow up in 1 month(s) with Dr. Johnsie Cancel   Signed, Cecilie Kicks, NP  04/26/2019 5:03 PM    [facility]

## 2019-04-26 NOTE — Patient Instructions (Addendum)
Medication Instructions:  Your physician recommends that you continue on your current medications as directed. Please refer to the Current Medication list given to you today.  If you need a refill on your cardiac medications before your next appointment, please call your pharmacy.   Lab work: None ordered  If you have labs (blood work) drawn today and your tests are completely normal, you will receive your results only by: Marland Kitchen MyChart Message (if you have MyChart) OR . A paper copy in the mail If you have any lab test that is abnormal or we need to change your treatment, we will call you to review the results.  Testing/Procedures: None ordered  Follow-Up: We will contact you regarding follow-up when we speak with Dr. Johnsie Cancel.  Any Other Special Instructions Will Be Listed Below (If Applicable). Always wear a mask when you go out. It is recommended that you STOP SMOKING.  See helpful information below:  Steps to Quit Smoking  Smoking tobacco can be bad for your health. It can also affect almost every organ in your body. Smoking puts you and people around you at risk for many serious long-lasting (chronic) diseases. Quitting smoking is hard, but it is one of the best things that you can do for your health. It is never too late to quit. What are the benefits of quitting smoking? When you quit smoking, you lower your risk for getting serious diseases and conditions. They can include:  Lung cancer or lung disease.  Heart disease.  Stroke.  Heart attack.  Not being able to have children (infertility).  Weak bones (osteoporosis) and broken bones (fractures). If you have coughing, wheezing, and shortness of breath, those symptoms may get better when you quit. You may also get sick less often. If you are pregnant, quitting smoking can help to lower your chances of having a baby of low birth weight. What can I do to help me quit smoking? Talk with your doctor about what can help you quit  smoking. Some things you can do (strategies) include:  Quitting smoking totally, instead of slowly cutting back how much you smoke over a period of time.  Going to in-person counseling. You are more likely to quit if you go to many counseling sessions.  Using resources and support systems, such as: ? Database administrator with a Social worker. ? Phone quitlines. ? Careers information officer. ? Support groups or group counseling. ? Text messaging programs. ? Mobile phone apps or applications.  Taking medicines. Some of these medicines may have nicotine in them. If you are pregnant or breastfeeding, do not take any medicines to quit smoking unless your doctor says it is okay. Talk with your doctor about counseling or other things that can help you. Talk with your doctor about using more than one strategy at the same time, such as taking medicines while you are also going to in-person counseling. This can help make quitting easier. What things can I do to make it easier to quit? Quitting smoking might feel very hard at first, but there is a lot that you can do to make it easier. Take these steps:  Talk to your family and friends. Ask them to support and encourage you.  Call phone quitlines, reach out to support groups, or work with a Social worker.  Ask people who smoke to not smoke around you.  Avoid places that make you want (trigger) to smoke, such as: ? Bars. ? Parties. ? Smoke-break areas at work.  Spend time with  people who do not smoke.  Lower the stress in your life. Stress can make you want to smoke. Try these things to help your stress: ? Getting regular exercise. ? Deep-breathing exercises. ? Yoga. ? Meditating. ? Doing a body scan. To do this, close your eyes, focus on one area of your body at a time from head to toe, and notice which parts of your body are tense. Try to relax the muscles in those areas.  Download or buy apps on your mobile phone or tablet that can help you stick to  your quit plan. There are many free apps, such as QuitGuide from the [employer] Office manager for Disease Control and Prevention). You can find more support from smokefree.gov and other websites. This information is not intended to replace advice given to you by your health care provider. Make sure you discuss any questions you have with your health care provider. Document Released: 10/10/2009 Document Revised: 08/11/2016 Document Reviewed: 04/30/2015 Elsevier Interactive Patient Education  2019 [place] physician has requested that you have cardiac CT. Cardiac computed tomography (CT) is a painless test that uses an x-ray machine to take clear, detailed pictures of your heart. For further information please visit HugeFiesta.tn. Please follow instruction sheet as given.  Please arrive at the [facility] main entrance of [facility] at xx:xx AM (30-45 minutes prior to test start time)  [facility] [address], [place] [address] [phone]  Proceed to the [facility] Radiology Department (First Floor).  Please follow these instructions carefully (unless otherwise directed):  On the Night Before the Test: . Be sure to Drink plenty of water. . Do not consume any caffeinated/decaffeinated beverages or chocolate 12 hours prior to your test. . Do not take any antihistamines 12 hours prior to your test.   On the Day of the Test: . Drink plenty of water. Do not drink any water within one hour of the test. . Do not eat any food 4 hours prior to the test. . You may take your regular medications prior to the test.  . Take metoprolol (Lopressor) two hours prior to test.   *For Clinical Staff only. Please instruct patient the following:*        -Drink plenty of water       -Take metoprolol (Lopressor) 2 hours prior to test (if applicable).                  -If HR is less than 55 BPM- No Beta Blocker                -IF HR is greater than 55 BPM and  patient is less than or equal to 51 yrs old Lopressor 100mg  x1.                -If HR is greater than 55 BPM and patient is greater than 93 yrs old Lopressor 50 mg x1.     Do not give Lopressor to patients with an allergy to lopressor or anyone with asthma or active COPD symptoms (currently taking steroids).       After the Test: . Drink plenty of water. . After receiving IV contrast, you may experience a mild flushed feeling. This is normal. . On occasion, you may experience a mild rash up to 24 hours after the test. This is not dangerous. If this occurs, you can take Benadryl 25 mg and increase your fluid intake. . If you experience  trouble breathing, this can be serious. If it is severe call 911 IMMEDIATELY. If it is mild, please call our office.

## 2019-04-26 NOTE — H&P (View-Only) (Signed)
Virtual Visit via Telephone Note   This visit type was conducted due to national recommendations for restrictions regarding the COVID-19 Pandemic (e.g. social distancing) in an effort to limit this patient's exposure and mitigate transmission in our community.  Due to her co-morbid illnesses, this patient is at least at moderate risk for complications without adequate follow up.  This format is felt to be most appropriate for this patient at this time.  The patient did not have access to video technology/had technical difficulties with video requiring transitioning to audio format only (telephone).  All issues noted in this document were discussed and addressed.  No physical exam could be performed with this format.  Please refer to the patient's chart for her  consent to telehealth for [facility].   Evaluation Performed: [facility]   Date:  04/26/2019   ID:  Denise King, DOB Aug 20, 1951, MRN [id number]  Patient Location: Home Provider Location: Office  PCP:  Biagio Borg, MD  Cardiologist:  Jenkins Rouge, MD  Electrophysiologist:  None   Chief Complaint:  Post hospital with elevated troponins  History of Present Illness:    Denise King is a 68 y.o. female with hospitalization for acute Viral syndrome and URI symptoms and neg COVID lab test.  Resp panel neg.    She had no history of cardiac disease admitted with 48 hours dry cough and dyspnea then had nausea and vomiting Has had weeks of diarrhea ? Due to [name]. After vomiting had mild central chest pressure now gone. She is weak with malaise Has back, rib and flank pain Denies dysuria She is a ppd smoker active over 40 years. No sick contacts or fever CXR and CT in ER ok. ECG NSR rate 92 totally normal with no signs of ischemia or pericarditis Troponin 1.07 WBC mildly elevated 10.7  Troponin remained flat and echo without RWMA.  She was on IV heparin for 48 hours and neg PE on CTA of chest but + CAD.  In mLAD.  Lipids  were normal in hospital, pt placed on ASA.   No statins yet.   Today Denise King is doing pretty well.  She is on the ASA 81 mg. She is doing some walking and di go to [employer] to walk and buy, did not wear a mask but stayed 6 feet away from others.  She is trying to eat better and not become dehydrated.  She has had virtual visit with her PCP and labs were stable.  She continues to smoke but does wish to stop.  She will call the 1-800 Quit number she has. To see if they can help her.    She does have chest tightness more at rest.  When walking at [employer] she had no heaviness. No associated diaphoresis or nausea.   The patient does not have symptoms concerning for COVID-19 infection (fever, chills, cough, or new shortness of breath).    Past Medical History:  Diagnosis Date   Acute cystitis    Acute respiratory failure ([place])    Allergy    as a child grew out of them   Anemia    pernicious anemia   Anxiety    Arthritis    Back pain    Blood transfusion    CAP (community acquired pneumonia)    Chronic pain syndrome    Depression    Diverticulosis of colon    Dysthymia    Esophagitis    EtOH dependence ([place])  Gastritis    GERD (gastroesophageal reflux disease)    H/O chest pain Dec. 2013   no work up done   Hx of colonic polyps    Hypertension    Irritable bowel syndrome    Osteopenia    Osteoporosis    Pneumonia 2019   Polypharmacy    Reflux esophagitis    Right sided sciatica 12/22/2017   Tobacco use disorder    Unspecified chronic bronchitis (HCC)    Vertigo    Vitamin B12 deficiency    Wears glasses    Past Surgical History:  Procedure Laterality Date   APPENDECTOMY     BACK SURGERY  10-09   Dr. Ricke Hey TUNNEL RELEASE Right 08/14/2014   Procedure: RIGHT CARPAL TUNNEL RELEASE AND INJECT LEFT THUMB;  Surgeon: Daryll Brod, MD;  Location: [facility];  Service: Orthopedics;  Laterality: Right;   COLONOSCOPY      INTRAMEDULLARY (IM) NAIL INTERTROCHANTERIC Right 10/01/2018   Procedure: INTRAMEDULLARY (IM) NAIL INTERTROCHANTRIC;  Surgeon: Thornton Park, MD;  Location: [facility];  Service: Orthopedics;  Laterality: Right;   LAPAROSCOPY N/A 02/21/2013   Procedure: LAPAROSCOPY OPERATIVE;  Surgeon: Margarette Asal, MD;  Location: [facility];  Service: Gynecology;  Laterality: N/A;  REQUESTING 5MM SCOPE WITH CAMERA   TONSILLECTOMY     TUBAL LIGATION     UPPER GASTROINTESTINAL ENDOSCOPY       Current Meds  Medication Sig   ARIPiprazole (ABILIFY) 2 MG tablet Take 1 tablet (2 mg total) by mouth daily for 30 days.   aspirin 81 MG chewable tablet Chew 1 tablet (81 mg total) by mouth daily.   cholestyramine (QUESTRAN) 4 GM/DOSE powder Take 1 packet (4 g total) by mouth 2 (two) times daily with a meal.   clonazePAM (KLONOPIN) 1 MG tablet Take 1 mg by mouth daily as needed. daily at bedtime PRN   folic acid (FOLVITE) 1 MG tablet Take 1 tablet (1 mg total) by mouth daily.   hyoscyamine (ANASPAZ) 0.125 MG TBDP disintergrating tablet Place 0.125 mg under the tongue every 6 (six) hours as needed ([name] cramps).    Ipratropium-Albuterol (COMBIVENT) 20-100 MCG/ACT AERS respimat Inhale 1 puff into the lungs every 6 (six) hours as needed for wheezing or shortness of breath.   morphine (MS CONTIN) 30 MG 12 hr tablet Take 30 mg by mouth every 8 (eight) hours.   Oxycodone HCl 10 MG TABS Take 10 mg by mouth every 4 (four) hours.    pantoprazole (PROTONIX) 40 MG tablet Take 1 tablet (40 mg total) by mouth daily.   sertraline (ZOLOFT) 100 MG tablet Take 1.5 tablets (150 mg total) by mouth daily.   triamcinolone cream (KENALOG) 0.1 % Apply 1 application topically 2 (two) times daily as needed (itching).    vitamin B-12 (CYANOCOBALAMIN) 100 MCG tablet Take 1 tablet (100 mcg total) by mouth daily.     Allergies:   Atorvastatin; Levofloxacin; Omnicef [cefdinir]; Trazodone and nefazodone; Sulfa antibiotics; and  Sulfonamide derivatives   Social History   Tobacco Use   Smoking status: Current Every Day Smoker    Packs/day: 1.00    Years: 30.00    Pack years: 30.00    Types: Cigarettes   Smokeless tobacco: Never Used  Substance Use Topics   Alcohol use: Yes    Alcohol/week: 3.0 standard drinks    Types: 3 Standard drinks or equivalent per week    Comment: occasional   Drug use: No     Family  Hx: The patient's family history includes Alcohol abuse in her daughter; Bipolar disorder in her daughter; Colon cancer in her brother and father; Drug abuse in her daughter; Heart disease in her mother; Prostate cancer in her father; Skin cancer in her sister. There is no history of Esophageal cancer, Rectal cancer, or Stomach cancer.  ROS:   Please see the history of present illness.    General:no colds or fevers, no weight changes Skin:no rashes or ulcers HEENT:no blurred vision, no congestion CV:see HPI PUL:see HPI GI:no diarrhea now but hx of [name].  no constipation or melena, no indigestion GU:no hematuria, no dysuria MS:no joint pain, no claudication Neuro:no syncope, no lightheadedness Endo:no diabetes, no thyroid disease hemoc followed by Hematology visit in July.     All other systems reviewed and are negative.   Prior CV studies:   The following studies were reviewed today:  Limited Echo 04/11/19 IMPRESSIONS    1. The left ventricle has normal systolic function, with an ejection fraction of 60-65%. The cavity size was normal. Left ventricular diastolic Doppler parameters are consistent with impaired relaxation.  2. The mitral valve is grossly normal.  3. The tricuspid valve was grossly normal.  4. The aortic valve is tricuspid Aortic valve regurgitation was not assessed by color flow Doppler. No stenosis of the aortic valve.  5. Limited study for LV function; full doppler not performed; normal LV function; mild diastolic dysfunction.  FINDINGS  Left Ventricle: The left  ventricle has normal systolic function, with an ejection fraction of 60-65%. The cavity size was normal. There is no increase in left ventricular wall thickness. Left ventricular diastolic Doppler parameters are consistent with  impaired relaxation (grade I).   Right Ventricle: The right ventricle has normal systolic function. The cavity was normal. There is right vetricular wall thickness was not assessed. Left Atrium: Left atrial size was normal in size. Right Atrium: Right atrial size was normal in size. Right atrial pressure is estimated at 10 mmHg.   Pericardium: There is no evidence of pericardial effusion. Mitral Valve: The mitral valve is grossly normal. Mitral valve regurgitation was not assessed by color flow Doppler. Tricuspid Valve: The tricuspid valve was grossly normal. Tricuspid valve regurgitation is trivial by color flow Doppler. Aortic Valve: The aortic valve is tricuspid Aortic valve regurgitation was not assessed by color flow Doppler. There is No stenosis of the aortic valve. Pulmonic Valve: The pulmonic valve was grossly normal. Pulmonic valve regurgitation was not assessed by color flow Doppler. Venous: The inferior vena cava is normal in size with greater than 50% respiratory variability.     Labs/Other Tests and Data Reviewed:    EKG:  An ECG dated 04/09/19 was personally reviewed today and demonstrated:  SR with T wave inversions V 1 and 2 and some into V3, new from admit EKG on 04/07/19.  Recent Labs: 04/08/2019: B Natriuretic Peptide 449.8; TSH 0.499 04/19/2019: ALT 7; BUN 4; Creatinine, Ser 0.67; Hemoglobin 14.4; Magnesium 2.2; Platelets 423.0; Potassium 4.2; Sodium 139   Recent Lipid Panel Lab Results  Component Value Date/Time   CHOL 128 04/09/2019 06:15 AM   TRIG 52 04/09/2019 06:15 AM   HDL 47 04/09/2019 06:15 AM   CHOLHDL 2.7 04/09/2019 06:15 AM   LDLCALC 71 04/09/2019 06:15 AM   LDLDIRECT 102.4 05/24/2008 10:37 AM    Wt Readings from Last 3  Encounters:  04/26/19 121 lb (54.9 kg)  04/07/19 125 lb 14.4 oz (57.1 kg)  11/03/18 122 lb (55.3 kg)  Objective:    Vital Signs:  Pulse 95    Ht 5\' 3"  (1.6 m)    Wt 121 lb (54.9 kg)    SpO2 96%    BMI 21.43 kg/m    VITAL SIGNS:  reviewed  General strong voice Neuro alert and oriented X 3  Psych: pleasant affect     ASSESSMENT & PLAN:    1. Chest pain, with recent acute viral syndrome and troponin up 1.07; 1.11 and 0.83, fairly flat.  Treated with IV heparin and neg CTA of chest for PE.  Still with chest heaviness at rest at times.  Echo with no RWMA, on CTA of chest she did have coronary calcification, mLAD.  She is better, she did have abnormal EKG.  Will discuss with Dr. Johnsie Cancel on Cardiac CTA vs lexiscan stress test.  And timing. Continue ASA for now 2.  HDL was stable in hospital but this was with diarrhea.  Would add statin for now lipitor 20 mg if Dr. Johnsie Cancel agrees. 3. Acute viral syndrome.  Neg COVID  4. Tobacco use, she would like to stop discussed some ways to stop 5. COPD given ABX and nebs 6. dehydration now stable.. 7. Chronic pain per PCP 8. GERD per PCP   COVID-19 Education: The signs and symptoms of COVID-19 were discussed with the patient and how to seek care for testing (follow up with PCP or arrange E-visit).  The importance of social distancing was discussed today.  Time:   Today, I have spent 15 minutes with the patient with telehealth technology discussing the above problems.     Medication Adjustments/Labs and Tests Ordered: Current medicines are reviewed at length with the patient today.  Concerns regarding medicines are outlined above.   Tests Ordered: No orders of the defined types were placed in this encounter.   Medication Changes: No orders of the defined types were placed in this encounter.   Disposition:  Follow up in 1 month(s) with Dr. Johnsie Cancel   Signed, Cecilie Kicks, NP  04/26/2019 5:03 PM    [facility]

## 2019-04-27 MED ORDER — METOPROLOL TARTRATE 100 MG PO TABS: ORAL_TABLET | ORAL | 3 refills | Status: DC

## 2019-04-27 NOTE — Addendum Note (Signed)
Addended by: Gaetano Net on: 04/27/2019 04:14 PM   Modules accepted: Orders

## 2019-04-27 NOTE — Progress Notes (Signed)
Can schedule cardiac CTA as soon as possible

## 2019-04-28 ENCOUNTER — Telehealth: Payer: Self-pay | Admitting: *Deleted

## 2019-04-28 NOTE — Telephone Encounter (Signed)
Error

## 2019-05-01 ENCOUNTER — Other Ambulatory Visit: Payer: Self-pay

## 2019-05-01 ENCOUNTER — Ambulatory Visit (HOSPITAL_COMMUNITY): Payer: Medicare HMO | Admitting: Psychiatry

## 2019-05-02 DIAGNOSIS — M150 Primary generalized (osteo)arthritis: Secondary | ICD-10-CM | POA: Diagnosis not present

## 2019-05-02 DIAGNOSIS — M25551 Pain in right hip: Secondary | ICD-10-CM | POA: Diagnosis not present

## 2019-05-02 DIAGNOSIS — M961 Postlaminectomy syndrome, not elsewhere classified: Secondary | ICD-10-CM | POA: Diagnosis not present

## 2019-05-02 DIAGNOSIS — R69 Illness, unspecified: Secondary | ICD-10-CM | POA: Diagnosis not present

## 2019-05-02 DIAGNOSIS — M546 Pain in thoracic spine: Secondary | ICD-10-CM | POA: Diagnosis not present

## 2019-05-02 DIAGNOSIS — Z79891 Long term (current) use of opiate analgesic: Secondary | ICD-10-CM | POA: Diagnosis not present

## 2019-05-02 DIAGNOSIS — G894 Chronic pain syndrome: Secondary | ICD-10-CM | POA: Diagnosis not present

## 2019-05-03 ENCOUNTER — Telehealth: Payer: Self-pay | Admitting: Cardiovascular Disease

## 2019-05-03 NOTE — Telephone Encounter (Signed)
Left message to call and schedule cardiac ct

## 2019-05-03 NOTE — Telephone Encounter (Signed)
Follow up  ° ° °Patient is returning your call. °

## 2019-05-04 ENCOUNTER — Other Ambulatory Visit: Payer: Self-pay

## 2019-05-04 ENCOUNTER — Ambulatory Visit (INDEPENDENT_AMBULATORY_CARE_PROVIDER_SITE_OTHER)
Admission: RE | Admit: 2019-05-04 | Discharge: 2019-05-04 | Disposition: A | Discharge status: Home / Self Care | Payer: Medicare HMO | Source: Ambulatory Visit | Attending: Internal Medicine | Admitting: Internal Medicine

## 2019-05-04 ENCOUNTER — Ambulatory Visit: Payer: Medicare HMO | Admitting: Internal Medicine

## 2019-05-04 DIAGNOSIS — R079 Chest pain, unspecified: Secondary | ICD-10-CM

## 2019-05-04 DIAGNOSIS — R0602 Shortness of breath: Secondary | ICD-10-CM | POA: Diagnosis not present

## 2019-05-04 DIAGNOSIS — B349 Viral infection, unspecified: Secondary | ICD-10-CM

## 2019-05-08 ENCOUNTER — Telehealth (HOSPITAL_COMMUNITY): Payer: Self-pay | Admitting: Emergency Medicine

## 2019-05-08 NOTE — Telephone Encounter (Signed)
pt returning phone call regarding upcoming cardiac imaging study; pt verbalizes understanding of appt date/time, parking situation and where to check in, pre-test NPO status and medications ordered, and verified current allergies; name and call back number provided for further questions should they arise Denise Bond RN [facility] [phone] office [phone] cell  Pt denies COVD 19 symptoms, verbalized understanding of visitor policy

## 2019-05-08 NOTE — Telephone Encounter (Signed)
Left message on voicemail with name and callback number Conya Ellinwood RN Navigator Cardiac Imaging [facility] [phone] Office [phone] Cell  

## 2019-05-09 ENCOUNTER — Other Ambulatory Visit: Payer: Self-pay

## 2019-05-09 ENCOUNTER — Telehealth: Payer: Self-pay

## 2019-05-09 ENCOUNTER — Ambulatory Visit (HOSPITAL_COMMUNITY)
Admission: RE | Admit: 2019-05-09 | Discharge: 2019-05-09 | Disposition: A | Discharge status: Home / Self Care | Payer: Medicare HMO | Source: Ambulatory Visit | Attending: Cardiovascular Disease | Admitting: Cardiovascular Disease

## 2019-05-09 ENCOUNTER — Other Ambulatory Visit: Payer: Medicare HMO

## 2019-05-09 ENCOUNTER — Other Ambulatory Visit: Payer: Self-pay | Admitting: Cardiovascular Disease

## 2019-05-09 DIAGNOSIS — R079 Chest pain, unspecified: Secondary | ICD-10-CM

## 2019-05-09 DIAGNOSIS — Z006 Encounter for examination for normal comparison and control in clinical research program: Secondary | ICD-10-CM

## 2019-05-09 DIAGNOSIS — R9439 Abnormal result of other cardiovascular function study: Secondary | ICD-10-CM

## 2019-05-09 DIAGNOSIS — I2510 Atherosclerotic heart disease of native coronary artery without angina pectoris: Secondary | ICD-10-CM | POA: Diagnosis not present

## 2019-05-09 DIAGNOSIS — I700 Atherosclerosis of aorta: Secondary | ICD-10-CM

## 2019-05-09 DIAGNOSIS — R0789 Other chest pain: Secondary | ICD-10-CM | POA: Diagnosis present

## 2019-05-09 LAB — CBC WITH DIFFERENTIAL/PLATELET
Basophils Absolute: 0 10*3/uL (ref 0.0–0.2)
Basos: 0 %
EOS (ABSOLUTE): 0 10*3/uL (ref 0.0–0.4)
Eos: 0 %
Hematocrit: 46.9 % — ABNORMAL HIGH (ref 34.0–46.6)
Hemoglobin: 15.9 g/dL (ref 11.1–15.9)
Lymphocytes Absolute: 3.3 10*3/uL — ABNORMAL HIGH (ref 0.7–3.1)
Lymphs: 36 %
MCH: 32.5 pg (ref 26.6–33.0)
MCHC: 33.9 g/dL (ref 31.5–35.7)
MCV: 96 fL (ref 79–97)
Monocytes Absolute: 0.7 10*3/uL (ref 0.1–0.9)
Monocytes: 7 %
Neutrophils Absolute: 5.2 10*3/uL (ref 1.4–7.0)
Neutrophils: 57 %
Platelets: 245 10*3/uL (ref 150–450)
RBC: 4.89 x10E6/uL (ref 3.77–5.28)
RDW: 13.6 % (ref 11.7–15.4)
WBC: 9.3 10*3/uL (ref 3.4–10.8)

## 2019-05-09 LAB — BASIC METABOLIC PANEL
BUN/Creatinine Ratio: 10 — ABNORMAL LOW (ref 12–28)
BUN: 6 mg/dL — ABNORMAL LOW (ref 8–27)
CO2: 28 mmol/L (ref 20–29)
Calcium: 9.1 mg/dL (ref 8.7–10.3)
Chloride: 97 mmol/L (ref 96–106)
Creatinine, Ser: 0.6 mg/dL (ref 0.57–1.00)
GFR calc Af Amer: 111 mL/min/{1.73_m2} (ref >59)
GFR calc non Af Amer: 96 mL/min/{1.73_m2} (ref >59)
Glucose: 113 mg/dL — ABNORMAL HIGH (ref 65–99)
Potassium: 4.3 mmol/L (ref 3.5–5.2)
Sodium: 134 mmol/L (ref 134–144)

## 2019-05-09 MED ORDER — SODIUM CHLORIDE 0.9% FLUSH: 3.0000 mL | Freq: Two times a day (BID) | INTRAVENOUS | Status: DC

## 2019-05-09 MED ORDER — NITROGLYCERIN 0.4 MG SL SUBL
0.8000 mg | SUBLINGUAL_TABLET | SUBLINGUAL | Status: DC | PRN
  Administered 2019-05-09: 0.8 mg via SUBLINGUAL

## 2019-05-09 MED ORDER — IOHEXOL 350 MG/ML SOLN
100.0000 mL | Freq: Once | INTRAVENOUS | Status: AC | PRN
  Administered 2019-05-09: 100 mL via INTRAVENOUS

## 2019-05-09 MED ORDER — NITROGLYCERIN 0.4 MG SL SUBL
SUBLINGUAL_TABLET | SUBLINGUAL | Status: AC
  Filled 2019-05-09: qty 2

## 2019-05-09 NOTE — Telephone Encounter (Signed)
Spoke with her at length including risks of cath

## 2019-05-09 NOTE — Research (Signed)
Denise King met inclusion and exclusion criteria.  The informed consent form, study requirements and expectations were reviewed with the subject and questions and concerns were addressed prior to the signing of the consent form.  The subject verbalized understanding of the trial requirements.  The subject agreed to participate in the CADFEM trial and signed the informed consent.  The informed consent was obtained prior to performance of any protocol-specific procedures for the subject.  A copy of the signed informed consent was given to the subject and a copy was placed in the subject's medical record.   [name]. [employer]

## 2019-05-09 NOTE — Telephone Encounter (Signed)
-----   Message from Josue Hector, MD sent at 05/09/2019  2:25 PM EDT ----- Significant CAD likely obstructive in RCA/LAD. Needs cath see if we can arrange for Thursday or Friday orders written

## 2019-05-09 NOTE — Telephone Encounter (Signed)
Patient is scheduled for heart cath on 05/11/19 with Dr. Burt Knack. Patient is getting lab work now and will have rapid screening covid test on Wednesday. Went over instructions with patient on the phone and sent letter through [place]. Will call testing site tomorrow, to let them know test is rapid, tried to call today, no answer. [phone]

## 2019-05-10 ENCOUNTER — Telehealth: Payer: Self-pay | Admitting: *Deleted

## 2019-05-10 ENCOUNTER — Other Ambulatory Visit (HOSPITAL_COMMUNITY)
Admission: RE | Admit: 2019-05-10 | Discharge: 2019-05-10 | Disposition: A | Discharge status: Home / Self Care | Payer: Medicare HMO | Source: Ambulatory Visit | Attending: Cardiovascular Disease | Admitting: Cardiovascular Disease

## 2019-05-10 DIAGNOSIS — Z1159 Encounter for screening for other viral diseases: Secondary | ICD-10-CM | POA: Diagnosis not present

## 2019-05-10 LAB — SARS CORONAVIRUS 2 BY RT PCR (HOSPITAL ORDER, PERFORMED IN ~~LOC~~ HOSPITAL LAB): SARS Coronavirus 2: NEGATIVE

## 2019-05-10 IMAGING — CR DG LUMBAR SPINE COMPLETE 4+V
5 series · 5 of 5 positions shown · non-contrast
Comparison: MRI lumbar spine 03/26/2017. CT abdomen and pelvis
07/17/2017. CT lumbar spine 07/22/2016.

CLINICAL DATA: Worsening right-side sciatica for several months.
History of prior lumbar fusion.

EXAM:
LUMBAR SPINE - COMPLETE 4+ VIEW

[w lumbar spine ap]
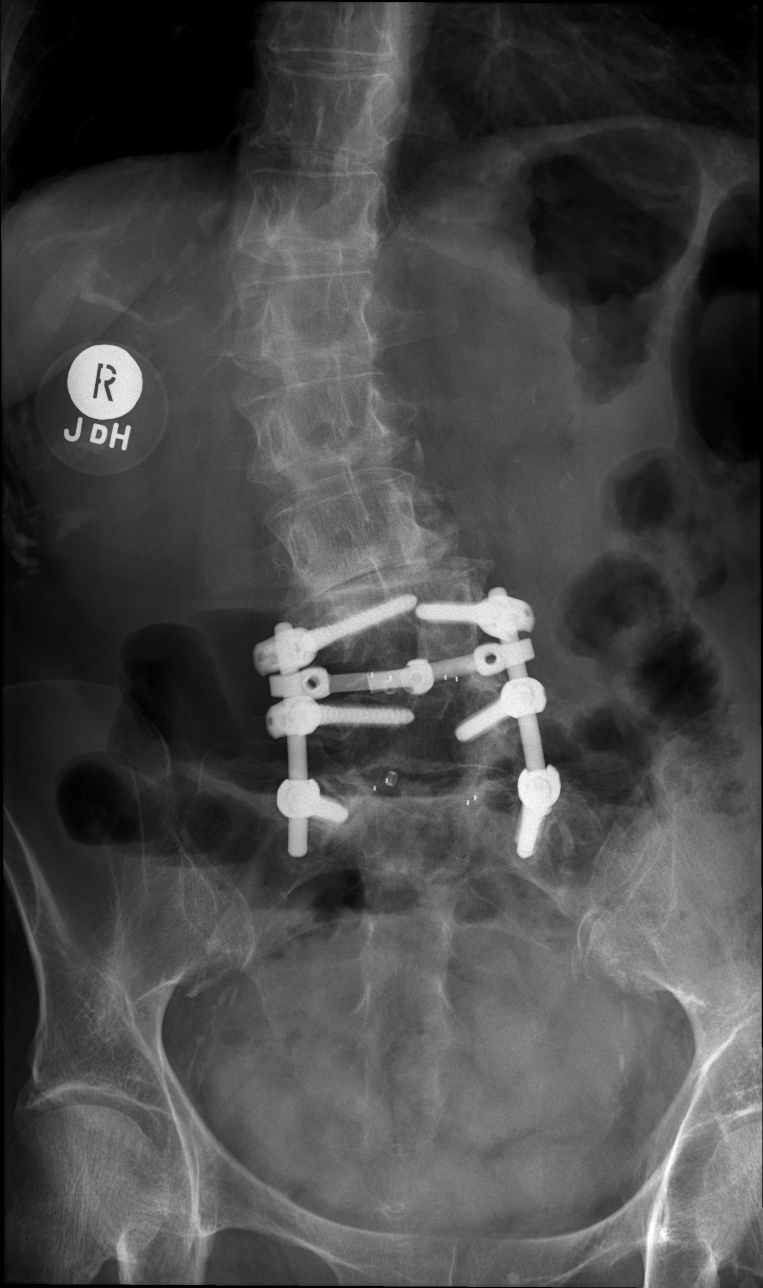

[w lumbar spine obl (1 of 2)]
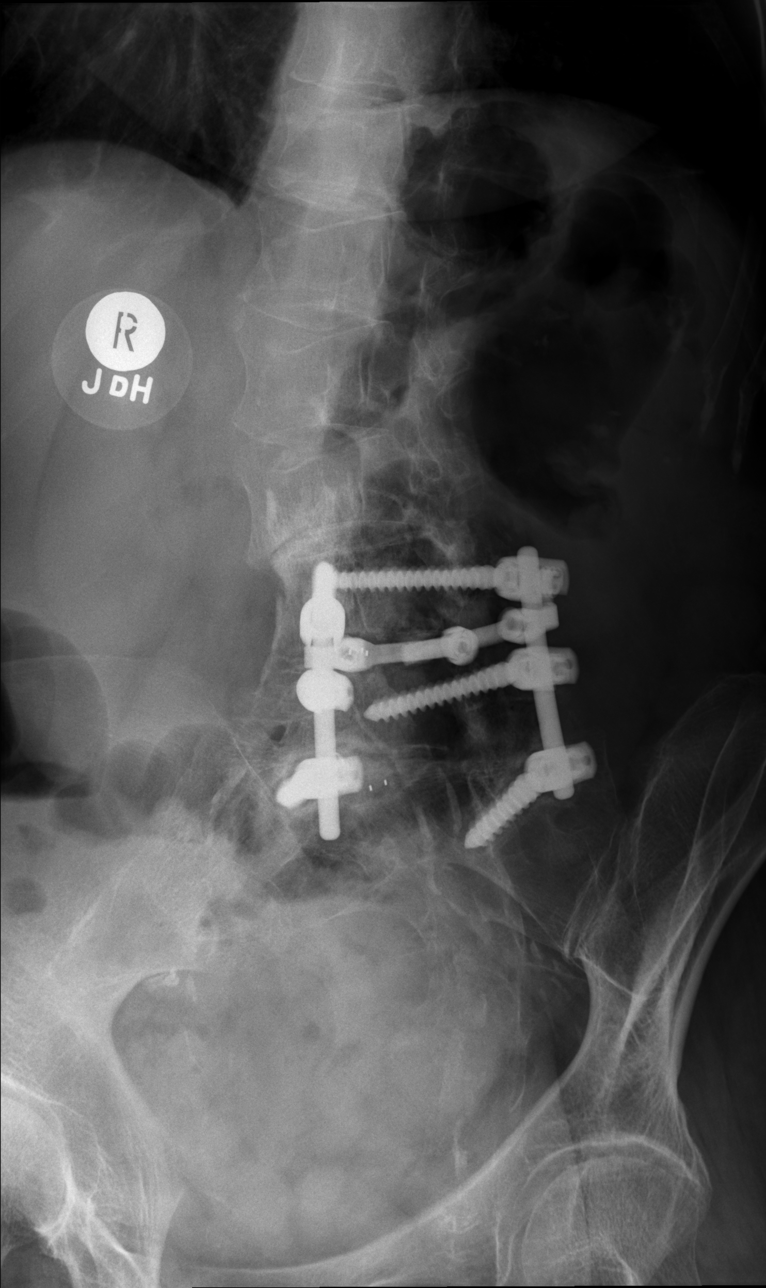

[w lumbar spine obl (2 of 2)]
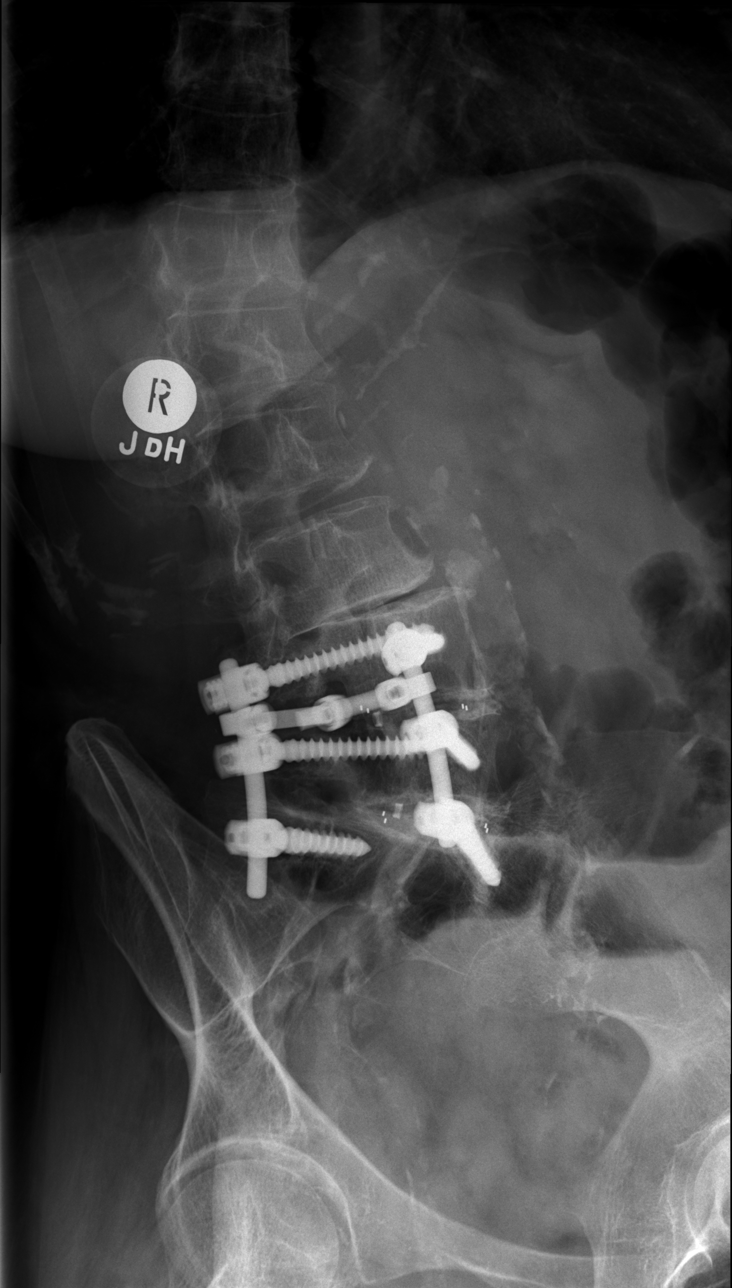

[w lumbar spine lat]
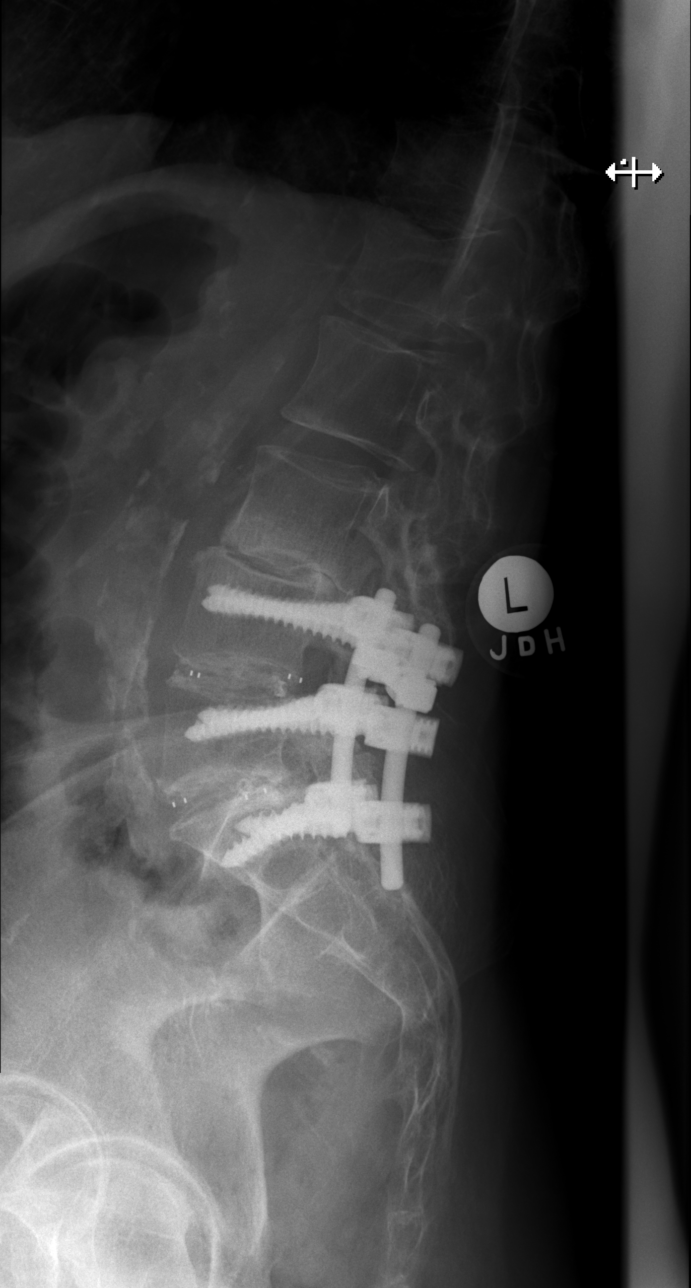

[w lumbar l-5 s-1 spot]
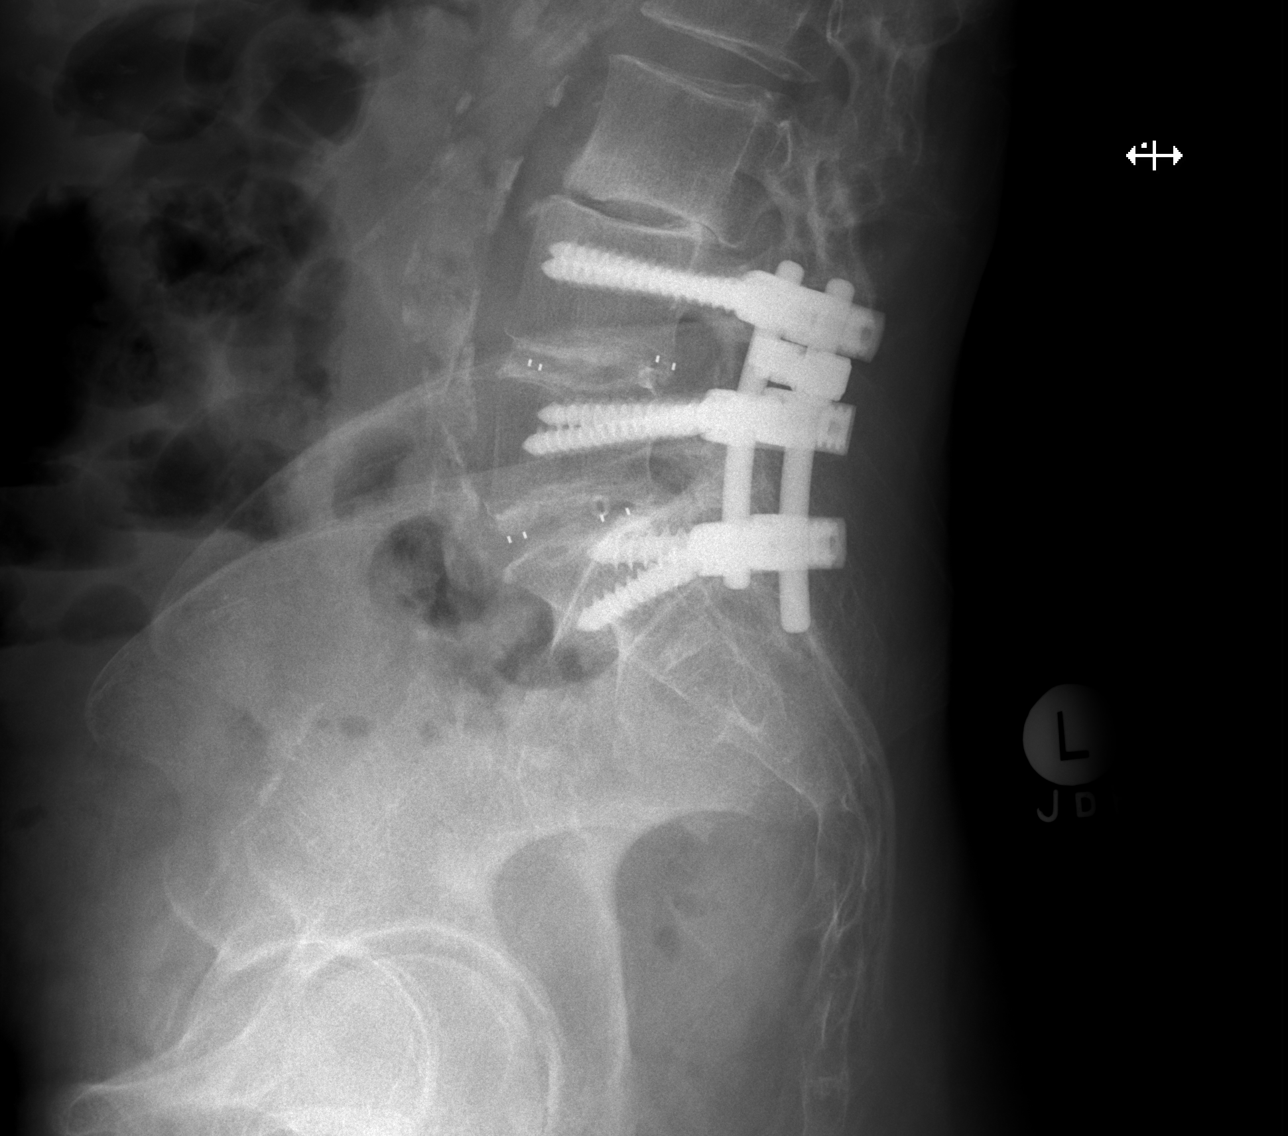

[5 of 5 positions shown; findings below may reference images not displayed]

FINDINGS: Convex right thoracolumbar scoliosis with the apex at T12-L1 is
again seen. The patient is status post L4-S1 fusion. Hardware is
intact. There is lucency about the S1 screws, chronic. Severe loss
of disc space height is present at L3-4 and appears worse than the
prior exams. There is also approximately 0.5 cm retrolisthesis L3 on
L4. Atherosclerosis is noted.
IMPRESSION: No acute abnormality.

Severe degenerative change at L3-4 appears worse than on the prior
exams.

Status post L4-S1 fusion. Lucency about the S1 screws is consistent
with loosening and chronic.

## 2019-05-10 NOTE — Telephone Encounter (Signed)
Pt contacted pre-catheterization scheduled at [facility] for: Thursday May 11, 2019 9 AM Verified arrival time and place: [place] Entrance A at:  7 AM Covid-19-[facility] 05/10/19 11:25 AM  No solid food after midnight prior to cath, clear liquids until 5 AM day of procedure. Contrast allergy: no   AM meds can be  taken pre-cath with sip of water including: ASA 81 mg   Confirmed patient has responsible person to drive home post procedure and observe 24 hours after arriving home: yes   Pt advised due to Covid-19 pandemic, [facility] is restricting visitors and only patients should present for check-in prior to their procedure. People will not be allowed to enter [facility] with the patient. At this time [facility] is not allowing visitors to all [facility] campuses.    Per Dr Leanne Chang Daily Covid Update 05/09/19: Universal masks: Effective immediately, we are requiring all ED patients and all ambulatory care patients to wear a universal mask, which we will provide. In-patients will have a mask at their bedside and will only be asked to put it on when a caregiver comes into their room.       COVID-19 Pre-Screening Questions:  . In the past 7 to 10 days have you had a cough,  shortness of breath, headache, congestion, fever, body aches, chills, sore throat, or sudden loss of taste or sense of smell? no . Have you been around anyone with known Covid 19? no . Have you been around anyone who is awaiting Covid 19 test results in the past 7 to 10 days? no . Have you been around anyone who has been exposed to Covid 19, or has mentioned symptoms of Covid 19 within the past 7 to 10 days? no  I reviewed procedure instructions, visitor/mask, and Covid- 19 screening questions with patient, she verbalized understanding,

## 2019-05-10 NOTE — Telephone Encounter (Signed)
Testing has been called, and they are aware patient's test will be a rapid.

## 2019-05-11 ENCOUNTER — Ambulatory Visit (HOSPITAL_COMMUNITY)
Admission: RE | Admit: 2019-05-11 | Discharge: 2019-05-11 | Disposition: A | Discharge status: Home / Self Care | Payer: Medicare HMO | Attending: Cardiovascular Disease | Admitting: Cardiovascular Disease

## 2019-05-11 ENCOUNTER — Encounter (HOSPITAL_COMMUNITY): Payer: Self-pay | Admitting: Cardiovascular Disease

## 2019-05-11 ENCOUNTER — Other Ambulatory Visit: Payer: Self-pay

## 2019-05-11 ENCOUNTER — Encounter (HOSPITAL_COMMUNITY)
Admission: RE | Disposition: A | Discharge status: Home / Self Care | Payer: Medicare HMO | Source: Home / Self Care | Attending: Cardiovascular Disease

## 2019-05-11 DIAGNOSIS — R7989 Other specified abnormal findings of blood chemistry: Secondary | ICD-10-CM | POA: Diagnosis not present

## 2019-05-11 DIAGNOSIS — I10 Essential (primary) hypertension: Secondary | ICD-10-CM | POA: Insufficient documentation

## 2019-05-11 DIAGNOSIS — Z888 Allergy status to other drugs, medicaments and biological substances status: Secondary | ICD-10-CM | POA: Diagnosis not present

## 2019-05-11 DIAGNOSIS — Z79899 Other long term (current) drug therapy: Secondary | ICD-10-CM | POA: Insufficient documentation

## 2019-05-11 DIAGNOSIS — R69 Illness, unspecified: Secondary | ICD-10-CM | POA: Diagnosis not present

## 2019-05-11 DIAGNOSIS — M8580 Other specified disorders of bone density and structure, unspecified site: Secondary | ICD-10-CM | POA: Insufficient documentation

## 2019-05-11 DIAGNOSIS — Z03818 Encounter for observation for suspected exposure to other biological agents ruled out: Secondary | ICD-10-CM | POA: Diagnosis not present

## 2019-05-11 DIAGNOSIS — R931 Abnormal findings on diagnostic imaging of heart and coronary circulation: Secondary | ICD-10-CM | POA: Diagnosis present

## 2019-05-11 DIAGNOSIS — M810 Age-related osteoporosis without current pathological fracture: Secondary | ICD-10-CM | POA: Insufficient documentation

## 2019-05-11 DIAGNOSIS — K589 Irritable bowel syndrome without diarrhea: Secondary | ICD-10-CM | POA: Diagnosis not present

## 2019-05-11 DIAGNOSIS — Z8249 Family history of ischemic heart disease and other diseases of the circulatory system: Secondary | ICD-10-CM | POA: Diagnosis not present

## 2019-05-11 DIAGNOSIS — I2510 Atherosclerotic heart disease of native coronary artery without angina pectoris: Secondary | ICD-10-CM | POA: Diagnosis not present

## 2019-05-11 DIAGNOSIS — Z882 Allergy status to sulfonamides status: Secondary | ICD-10-CM | POA: Diagnosis not present

## 2019-05-11 DIAGNOSIS — I2584 Coronary atherosclerosis due to calcified coronary lesion: Secondary | ICD-10-CM | POA: Diagnosis not present

## 2019-05-11 DIAGNOSIS — F17210 Nicotine dependence, cigarettes, uncomplicated: Secondary | ICD-10-CM | POA: Insufficient documentation

## 2019-05-11 DIAGNOSIS — Z881 Allergy status to other antibiotic agents status: Secondary | ICD-10-CM | POA: Insufficient documentation

## 2019-05-11 DIAGNOSIS — G894 Chronic pain syndrome: Secondary | ICD-10-CM | POA: Diagnosis not present

## 2019-05-11 DIAGNOSIS — K219 Gastro-esophageal reflux disease without esophagitis: Secondary | ICD-10-CM | POA: Insufficient documentation

## 2019-05-11 DIAGNOSIS — J449 Chronic obstructive pulmonary disease, unspecified: Secondary | ICD-10-CM | POA: Insufficient documentation

## 2019-05-11 DIAGNOSIS — Z7982 Long term (current) use of aspirin: Secondary | ICD-10-CM | POA: Diagnosis not present

## 2019-05-11 DIAGNOSIS — Z9851 Tubal ligation status: Secondary | ICD-10-CM | POA: Insufficient documentation

## 2019-05-11 HISTORY — PX: LEFT HEART CATH AND CORONARY ANGIOGRAPHY: CATH118249

## 2019-05-11 SURGERY — LEFT HEART CATH AND CORONARY ANGIOGRAPHY
Anesthesia: LOCAL

## 2019-05-11 MED ORDER — LIDOCAINE HCL (PF) 1 % IJ SOLN
INTRAMUSCULAR | Status: DC | PRN
  Administered 2019-05-11: 2 mL via INTRADERMAL

## 2019-05-11 MED ORDER — VERAPAMIL HCL 2.5 MG/ML IV SOLN
INTRAVENOUS | Status: AC
  Filled 2019-05-11: qty 2

## 2019-05-11 MED ORDER — HEPARIN (PORCINE) IN NACL 1000-0.9 UT/500ML-% IV SOLN
INTRAVENOUS | Status: DC | PRN
  Administered 2019-05-11 (×2): 500 mL

## 2019-05-11 MED ORDER — SODIUM CHLORIDE 0.9 % WEIGHT BASED INFUSION
3.0000 mL/kg/h | INTRAVENOUS | Status: AC
  Administered 2019-05-11: 3 mL/kg/h via INTRAVENOUS

## 2019-05-11 MED ORDER — HEPARIN SODIUM (PORCINE) 1000 UNIT/ML IJ SOLN
INTRAMUSCULAR | Status: DC | PRN
  Administered 2019-05-11: 3000 [IU] via INTRAVENOUS

## 2019-05-11 MED ORDER — LIDOCAINE HCL (PF) 1 % IJ SOLN
INTRAMUSCULAR | Status: AC
  Filled 2019-05-11: qty 30

## 2019-05-11 MED ORDER — IOHEXOL 350 MG/ML SOLN
INTRAVENOUS | Status: DC | PRN
  Administered 2019-05-11: 10:00:00 35 mL via INTRA_ARTERIAL

## 2019-05-11 MED ORDER — FENTANYL CITRATE (PF) 100 MCG/2ML IJ SOLN
INTRAMUSCULAR | Status: DC | PRN
  Administered 2019-05-11 (×2): 25 ug via INTRAVENOUS

## 2019-05-11 MED ORDER — SODIUM CHLORIDE 0.9 % IV SOLN: 250.0000 mL | INTRAVENOUS | Status: DC | PRN

## 2019-05-11 MED ORDER — SODIUM CHLORIDE 0.9 % IV SOLN: INTRAVENOUS | Status: DC

## 2019-05-11 MED ORDER — SODIUM CHLORIDE 0.9% FLUSH: 3.0000 mL | INTRAVENOUS | Status: DC | PRN

## 2019-05-11 MED ORDER — ACETAMINOPHEN 325 MG PO TABS: 650.0000 mg | ORAL_TABLET | ORAL | Status: DC | PRN

## 2019-05-11 MED ORDER — SODIUM CHLORIDE 0.9 % IV BOLUS: 250.0000 mL | Freq: Once | INTRAVENOUS | Status: DC

## 2019-05-11 MED ORDER — ASPIRIN 81 MG PO CHEW: 81.0000 mg | CHEWABLE_TABLET | ORAL | Status: DC

## 2019-05-11 MED ORDER — SODIUM CHLORIDE 0.9 % IV SOLN
250.0000 mL | INTRAVENOUS | Status: DC | PRN
  Administered 2019-05-11: 250 mL via INTRAVENOUS

## 2019-05-11 MED ORDER — MIDAZOLAM HCL 2 MG/2ML IJ SOLN
INTRAMUSCULAR | Status: AC
  Filled 2019-05-11: qty 2

## 2019-05-11 MED ORDER — VERAPAMIL HCL 2.5 MG/ML IV SOLN
INTRAVENOUS | Status: DC | PRN
  Administered 2019-05-11: 10:00:00 10 mL via INTRA_ARTERIAL

## 2019-05-11 MED ORDER — SODIUM CHLORIDE 0.9 % WEIGHT BASED INFUSION: 1.0000 mL/kg/h | INTRAVENOUS | Status: DC

## 2019-05-11 MED ORDER — FENTANYL CITRATE (PF) 100 MCG/2ML IJ SOLN
INTRAMUSCULAR | Status: AC
  Filled 2019-05-11: qty 2

## 2019-05-11 MED ORDER — HYDRALAZINE HCL 20 MG/ML IJ SOLN: 10.0000 mg | INTRAMUSCULAR | Status: DC | PRN

## 2019-05-11 MED ORDER — HEPARIN (PORCINE) IN NACL 1000-0.9 UT/500ML-% IV SOLN
INTRAVENOUS | Status: AC
  Filled 2019-05-11: qty 1000

## 2019-05-11 MED ORDER — ONDANSETRON HCL 4 MG/2ML IJ SOLN: 4.0000 mg | Freq: Four times a day (QID) | INTRAMUSCULAR | Status: DC | PRN

## 2019-05-11 MED ORDER — HEPARIN SODIUM (PORCINE) 1000 UNIT/ML IJ SOLN
INTRAMUSCULAR | Status: AC
  Filled 2019-05-11: qty 1

## 2019-05-11 MED ORDER — MIDAZOLAM HCL 2 MG/2ML IJ SOLN
INTRAMUSCULAR | Status: DC | PRN
  Administered 2019-05-11 (×2): 1 mg via INTRAVENOUS
  Administered 2019-05-11: 2 mg via INTRAVENOUS

## 2019-05-11 MED ORDER — LABETALOL HCL 5 MG/ML IV SOLN: 10.0000 mg | INTRAVENOUS | Status: DC | PRN

## 2019-05-11 MED ORDER — SODIUM CHLORIDE 0.9 % IV BOLUS
250.0000 mL | Freq: Once | INTRAVENOUS | Status: AC
  Administered 2019-05-11: 12:00:00 via INTRAVENOUS

## 2019-05-11 MED ORDER — MORPHINE SULFATE (PF) 10 MG/ML IV SOLN: 2.0000 mg | INTRAVENOUS | Status: DC | PRN

## 2019-05-11 MED ORDER — OXYCODONE HCL 5 MG PO TABS: 5.0000 mg | ORAL_TABLET | ORAL | Status: DC | PRN

## 2019-05-11 MED ORDER — SODIUM CHLORIDE 0.9% FLUSH: 3.0000 mL | Freq: Two times a day (BID) | INTRAVENOUS | Status: DC

## 2019-05-11 SURGICAL SUPPLY — 11 items
CATH INFINITI MULTIPACK ANG 4F (CATHETERS) ×1 IMPLANT
DEVICE RAD TR BAND REGULAR (VASCULAR PRODUCTS) ×1 IMPLANT
GLIDESHEATH SLEND SS 6F .021 (SHEATH) ×1 IMPLANT
GUIDEWIRE INQWIRE 1.5J.035X260 (WIRE) IMPLANT
INQWIRE 1.5J .035X260CM (WIRE) ×2
KIT HEART LEFT (KITS) ×2 IMPLANT
PACK CARDIAC CATHETERIZATION (CUSTOM PROCEDURE TRAY) ×2 IMPLANT
SHEATH GLIDE SLENDER 4/5FR (SHEATH) ×1 IMPLANT
TRANSDUCER W/STOPCOCK (MISCELLANEOUS) ×2 IMPLANT
TUBING CIL FLEX 10 FLL-RA (TUBING) ×2 IMPLANT
WIRE HI TORQ VERSACORE-J 145CM (WIRE) ×1 IMPLANT

## 2019-05-11 NOTE — Progress Notes (Addendum)
Ambulated pt to the bathroom Tol well Bp taken after walking- 136/72  Discharge instructions reviewed via telephone with pt husband. Voices understanding. Reviewed with pt.

## 2019-05-11 NOTE — Progress Notes (Signed)
Pt out of   trendelenburg

## 2019-05-11 NOTE — Progress Notes (Signed)
Manual bp [place], [place] in to see pt

## 2019-05-11 NOTE — Discharge Instructions (Signed)
Drink plenty of fluids °Keep right arm at or above heart level  °Radial Site Care ° °This sheet gives you information about how to care for yourself after your procedure. Your health care provider may also give you more specific instructions. If you have problems or questions, contact your health care provider. °What can I expect after the procedure? °After the procedure, it is common to have: °· Bruising and tenderness at the catheter insertion area. °Follow these instructions at home: °Medicines °· Take over-the-counter and prescription medicines only as told by your health care provider. °Insertion site care °· Follow instructions from your health care provider about how to take care of your insertion site. Make sure you: °? Wash your hands with soap and water before you change your bandage (dressing). If soap and water are not available, use hand sanitizer. °? Change your dressing as told by your health care provider. °? Leave stitches (sutures), skin glue, or adhesive strips in place. These skin closures may need to stay in place for 2 weeks or longer. If adhesive strip edges start to loosen and curl up, you may trim the loose edges. Do not remove adhesive strips completely unless your health care provider tells you to do that. °· Check your insertion site every day for signs of infection. Check for: °? Redness, swelling, or pain. °? Fluid or blood. °? Pus or a bad smell. °? Warmth. °· Do not take baths, swim, or use a hot tub until your health care provider approves. °· You may shower 24-48 hours after the procedure, or as directed by your health care provider. °? Remove the dressing and gently wash the site with plain soap and water. °? Pat the area dry with a clean towel. °? Do not rub the site. That could cause bleeding. °· Do not apply powder or lotion to the site. °Activity ° °· For 24 hours after the procedure, or as directed by your health care provider: °? Do not flex or bend the affected arm. °? Do  not push or pull heavy objects with the affected arm. °? Do not drive yourself home from the hospital or clinic. You may drive 24 hours after the procedure unless your health care provider tells you not to. °? Do not operate machinery or power tools. °· Do not lift anything that is heavier than 10 lb (4.5 kg), or the limit that you are told, until your health care provider says that it is safe. °· Ask your health care provider when it is okay to: °? Return to work or school. °? Resume usual physical activities or sports. °? Resume sexual activity. °General instructions °· If the catheter site starts to bleed, raise your arm and put firm pressure on the site. If the bleeding does not stop, get help right away. This is a medical emergency. °· If you went home on the same day as your procedure, a responsible adult should be with you for the first 24 hours after you arrive home. °· Keep all follow-up visits as told by your health care provider. This is important. °Contact a health care provider if: °· You have a fever. °· You have redness, swelling, or yellow drainage around your insertion site. °Get help right away if: °· You have unusual pain at the radial site. °· The catheter insertion area swells very fast. °· The insertion area is bleeding, and the bleeding does not stop when you hold steady pressure on the area. °· Your arm or hand   hand becomes pale, cool, tingly, or numb. °These symptoms may represent a serious problem that is an emergency. Do not wait to see if the symptoms will go away. Get medical help right away. Call your local emergency services (911 in the [place] Do not drive yourself to the hospital. °Summary °· After the procedure, it is common to have bruising and tenderness at the site. °· Follow instructions from your health care provider about how to take care of your radial site wound. Check the wound every day for signs of infection. °· Do not lift anything that is heavier than 10 lb (4.5 kg), or the  limit that you are told, until your health care provider says that it is safe. °This information is not intended to replace advice given to you by your health care provider. Make sure you discuss any questions you have with your health care provider. °Document Released: 01/16/2011 Document Revised: 01/19/2018 Document Reviewed: 01/19/2018 °Elsevier Interactive Patient Education © 2019 [employer] ° °

## 2019-05-11 NOTE — Progress Notes (Addendum)
[place] called about BP (see flowsheet) placed pt in [place]

## 2019-05-11 NOTE — Progress Notes (Addendum)
BP 83/63 o2 sat 87% pt denies pain or discomfort. Denies dizziness. Reino Bellis, [place] called and informed new orders noted  Bolus started as ordered o2 applied at 2 liters

## 2019-05-11 NOTE — Progress Notes (Signed)
Ria Comment, [place] called and informed of last few BP readings. States ok to D/C pt home if she can get up and move around, feels ok, and bp stays in the 90'S

## 2019-05-11 NOTE — Interval H&P Note (Signed)
Cath Lab Visit (complete for each Cath Lab visit)  Clinical Evaluation Leading to the Procedure:   ACS: No.  Non-ACS:    Anginal Classification: CCS II  Anti-ischemic medical therapy: No Therapy  Non-Invasive Test Results: Intermediate-risk stress test findings: cardiac mortality 1-3%/year  Prior CABG: No previous CABG      History and Physical Interval Note:  05/11/2019 9:09 AM  Denise King  has presented today for surgery, with the diagnosis of abnormal ct.  The various methods of treatment have been discussed with the patient and family. After consideration of risks, benefits and other options for treatment, the patient has consented to  Procedure(s): LEFT HEART CATH AND CORONARY ANGIOGRAPHY (N/A) as a surgical intervention.  The patient's history has been reviewed, patient examined, no change in status, stable for surgery.  I have reviewed the patient's chart and labs.  Questions were answered to the patient's satisfaction.     Sherren Mocha

## 2019-05-12 ENCOUNTER — Telehealth: Payer: Self-pay | Admitting: Cardiovascular Disease

## 2019-05-12 NOTE — Telephone Encounter (Addendum)
Left message for patient to call back. Patient had cath yesterday with Dr. Burt Knack.

## 2019-05-12 NOTE — Telephone Encounter (Signed)
New Message   Patient is calling because she just had a heart cath yesterday. She states that right below the injection site she has some bruising and a knot has developed. She states that the knot came about a hour ago.

## 2019-05-12 NOTE — Telephone Encounter (Signed)
Called patient again. Patient complaining about a quarter size knot that has recently appeared in the middle of her bruise on her right wrist at cath site. Patient denies any pain, redness, numbness, or heat to the area. Encouraged patient not to lift anything heavy or use this arm a lot. Informed her to follow discharge instructions from the cath lab and to hold pressure and seek medical attention if she has any bleeding from site. Will forward to Dr. Burt Knack for further advisement.

## 2019-05-12 NOTE — Telephone Encounter (Signed)
Called the patient and reviewed her symptoms.  Sounds like she just has some bruising primarily.  She denies any pain.  She denies a large knot over the site.  She will keep [place] over the weekend.  I advised her to lightly wrap an Ace bandage of the area.  She understands that if the size increases or she develops pain she should be evaluated even if it is over the weekend.

## 2019-05-15 ENCOUNTER — Encounter (HOSPITAL_COMMUNITY): Payer: Self-pay | Admitting: Psychiatry

## 2019-05-15 ENCOUNTER — Other Ambulatory Visit: Payer: Self-pay

## 2019-05-15 ENCOUNTER — Ambulatory Visit (INDEPENDENT_AMBULATORY_CARE_PROVIDER_SITE_OTHER): Payer: Medicare HMO | Admitting: Psychiatry

## 2019-05-15 DIAGNOSIS — M545 Low back pain: Secondary | ICD-10-CM | POA: Diagnosis not present

## 2019-05-15 DIAGNOSIS — F341 Dysthymic disorder: Secondary | ICD-10-CM

## 2019-05-15 DIAGNOSIS — R69 Illness, unspecified: Secondary | ICD-10-CM | POA: Diagnosis not present

## 2019-05-15 DIAGNOSIS — F411 Generalized anxiety disorder: Secondary | ICD-10-CM | POA: Diagnosis not present

## 2019-05-15 NOTE — Progress Notes (Signed)
Virtual Visit via Video Note  I connected with Denise King on 05/15/19 at  2:30 PM EDT by a video enabled telemedicine application and verified that I am speaking with the correct person using two identifiers.  Location: Patient: Denise King Provider: Lise Auer, LCSW   I discussed the limitations of evaluation and management by telemedicine and the availability of in person appointments. The patient expressed understanding and agreed to proceed.  History of Present Illness: Dysthymia and GAD due to complex medical conditions, family conflict, stage of life issues and adverse life experiences.    Observations/Objective: Counselor met with Denise King for individual therapy via Webex. Counselor assessed MH symptoms and progress on treatment plan goals.  Denise King denied suicidal ideation or self-harm behaviors. Denise King shared that she had a difficult week last week because she had to have an emergency procedure related to her heart. She was assured to find out the results were positive and will not result in a major surgery. Denise King spoke positively about her relationship with her daughter and her daughters functioning. Counselor processed basic needs and daily routine to ensure Denise King is following healthy habits to prevent medical or mental health services. Denise King expressed wanted to focus on her relationship with her husband in future sessions. Overall Brooks symptoms and behaviors have improved and have been stable over the past two weeks.   Assessment and Plan: Counselor will continue to meet with Denise King to address treatment plan goals. Denise King will continue to follow recommendations of providers and implement skills learned in session.  Follow Up Instructions: Counselor will send information for next session via Webex.    I discussed the assessment and treatment plan with the patient. The patient was provided an opportunity to ask questions and all were answered. The patient  agreed with the plan and demonstrated an understanding of the instructions.   The patient was advised to call back or seek an in-person evaluation if the symptoms worsen or if the condition fails to improve as anticipated.  I provided 45 minutes of non-face-to-face time during this encounter.   Lise Auer, LCSW

## 2019-05-16 ENCOUNTER — Telehealth: Payer: Self-pay | Admitting: Internal Medicine

## 2019-05-16 MED ORDER — CLONAZEPAM 1 MG PO TABS: 1.0000 mg | ORAL_TABLET | Freq: Every day | ORAL | 1 refills | Status: DC

## 2019-05-16 NOTE — Telephone Encounter (Signed)
Done erx 

## 2019-05-16 NOTE — Addendum Note (Signed)
Addended by: Biagio Borg on: 05/16/2019 05:02 PM   Modules accepted: Orders

## 2019-05-16 NOTE — Telephone Encounter (Signed)
Copied from [place] [id number]. Topic: Quick Communication - Rx Refill/Question >> May 16, 2019  1:06 PM Selinda Flavin B, NT wrote: Medication: clonazePAM (KLONOPIN) 1 MG tablet  Has the patient contacted their pharmacy? Yes, to call the office (Agent: If no, request that the patient contact the pharmacy for the refill.) (Agent: If yes, when and what did the pharmacy advise?)  Preferred Pharmacy (with phone number or street name): [place], [place].  Agent: Please be advised that RX refills may take up to 3 business days. We ask that you follow-up with your pharmacy.

## 2019-05-25 ENCOUNTER — Other Ambulatory Visit (HOSPITAL_COMMUNITY): Payer: Self-pay | Admitting: Psychiatry

## 2019-05-29 ENCOUNTER — Ambulatory Visit (INDEPENDENT_AMBULATORY_CARE_PROVIDER_SITE_OTHER): Payer: Medicare HMO | Admitting: Psychiatry

## 2019-05-29 ENCOUNTER — Other Ambulatory Visit: Payer: Self-pay

## 2019-05-29 DIAGNOSIS — F411 Generalized anxiety disorder: Secondary | ICD-10-CM

## 2019-05-29 DIAGNOSIS — F341 Dysthymic disorder: Secondary | ICD-10-CM

## 2019-05-29 DIAGNOSIS — R69 Illness, unspecified: Secondary | ICD-10-CM | POA: Diagnosis not present

## 2019-05-30 ENCOUNTER — Encounter (HOSPITAL_COMMUNITY): Payer: Self-pay | Admitting: Psychiatry

## 2019-05-30 NOTE — Progress Notes (Signed)
Virtual Visit via Video Note  I connected with SHANDELLE BORRELLI on 05/30/19 at  2:00 PM EDT by a video enabled telemedicine application and verified that I am speaking with the correct person using two identifiers.  Location: Patient: Denise King [facility] Provider: Lise Auer, LCSW   I discussed the limitations of evaluation and management by telemedicine and the availability of in person appointments. The patient expressed understanding and agreed to proceed.  History of Present Illness: Dysthymia and GAD due to adverse life experiences and marital/family problems.    Observations/Objective: Counselor met with [name] for individual therapy via Webex. Counselor assessed MH symptoms and progress on treatment plan goals. [name] denied suicidal ideation or self-harm behaviors. [name] shared that she was experiencing increased anxiety due to her daughters complications with a recent brain surgery. She reported her depression has increased with being indoors for so long with COVID-19 and expressed concerns related to her relationships with family members in the home. She was unable to elaborate due to privacy issues at the moment in the home. Counselor and [name] explored more helpful strategies to use to address her anxiety and depression. Counselor explored grief and loss issues associated with the loss of her job due to health concerns a few years ago. [name] reported that she is concerned about finances because her monthly income has decreased by $1000 a month due to eligibility status upon her most recent birthday. Counselor and [name] explored alternative ways to gain income from home. Counselor assigned therapy homework and we planned to meet in 2 weeks.   Assessment and Plan: Counselor will continue to meet with [name] to address treatment plan goals. [name] will continue to follow recommendations of providers and implement skills learned in session.  Follow Up  Instructions: Counselor will send information for next session via Webex.     I discussed the assessment and treatment plan with the patient. The patient was provided an opportunity to ask questions and all were answered. The patient agreed with the plan and demonstrated an understanding of the instructions.   The patient was advised to call back or seek an in-person evaluation if the symptoms worsen or if the condition fails to improve as anticipated.  I provided 55 minutes of non-face-to-face time during this encounter.   Lise Auer, LCSW

## 2019-05-31 DIAGNOSIS — M150 Primary generalized (osteo)arthritis: Secondary | ICD-10-CM | POA: Diagnosis not present

## 2019-05-31 DIAGNOSIS — Z79891 Long term (current) use of opiate analgesic: Secondary | ICD-10-CM | POA: Diagnosis not present

## 2019-05-31 DIAGNOSIS — M25551 Pain in right hip: Secondary | ICD-10-CM | POA: Diagnosis not present

## 2019-05-31 DIAGNOSIS — G894 Chronic pain syndrome: Secondary | ICD-10-CM | POA: Diagnosis not present

## 2019-05-31 DIAGNOSIS — M546 Pain in thoracic spine: Secondary | ICD-10-CM | POA: Diagnosis not present

## 2019-05-31 DIAGNOSIS — M961 Postlaminectomy syndrome, not elsewhere classified: Secondary | ICD-10-CM | POA: Diagnosis not present

## 2019-05-31 DIAGNOSIS — R69 Illness, unspecified: Secondary | ICD-10-CM | POA: Diagnosis not present

## 2019-06-06 DIAGNOSIS — M810 Age-related osteoporosis without current pathological fracture: Secondary | ICD-10-CM | POA: Diagnosis not present

## 2019-06-12 ENCOUNTER — Other Ambulatory Visit: Payer: Self-pay

## 2019-06-12 ENCOUNTER — Ambulatory Visit (INDEPENDENT_AMBULATORY_CARE_PROVIDER_SITE_OTHER): Payer: Medicare HMO | Admitting: Psychiatry

## 2019-06-12 DIAGNOSIS — R69 Illness, unspecified: Secondary | ICD-10-CM | POA: Diagnosis not present

## 2019-06-12 DIAGNOSIS — F341 Dysthymic disorder: Secondary | ICD-10-CM | POA: Diagnosis not present

## 2019-06-12 DIAGNOSIS — F411 Generalized anxiety disorder: Secondary | ICD-10-CM

## 2019-06-13 DIAGNOSIS — M818 Other osteoporosis without current pathological fracture: Secondary | ICD-10-CM | POA: Diagnosis not present

## 2019-06-14 ENCOUNTER — Encounter (HOSPITAL_COMMUNITY): Payer: Self-pay | Admitting: Psychiatry

## 2019-06-14 NOTE — Progress Notes (Signed)
Virtual Visit via Video Note  I connected with Denise King on 06/14/19 at  2:00 PM EDT by a video enabled telemedicine application and verified that I am speaking with the correct person using two identifiers.  Location: Patient: Denise King Provider: Lise Auer, LCSW   I discussed the limitations of evaluation and management by telemedicine and the availability of in person appointments. The patient expressed understanding and agreed to proceed.  History of Present Illness: Dysthymia and GAD   Observations/Objective: Counselor met with  for individual therapy via Webex. Counselor assessed MH symptoms and progress on treatment plan goals.Denise King denied suicidal ideation or self-harm behaviors. Denise King shared that she continues to experience anxiety due to financial issues, marital issues, and family challenges due to mental health and health issues of her children. Counselor processed her thoughts and feelings related to these issues. Counselor used CBT interventions to help challenge unhelpful thought patterns and to reduce anxiety. Counselor and Denise King explored ways she can focus more of her energy on her health and well-being, providing ideas and resources for her to look into. Denise King requested more information on resources for her children.   Assessment and Plan: Counselor will continue to meet with Denise King to address treatment plan goals. Denise King will continue to follow recommendations of providers and implement skills learned in session.  Follow Up Instructions: Counselor will send information for next session via Webex.   I provided 55 minutes of non-face-to-face time during this encounter.   Lise Auer, LCSW

## 2019-06-26 ENCOUNTER — Ambulatory Visit (HOSPITAL_COMMUNITY): Payer: Medicare HMO | Admitting: Psychiatry

## 2019-06-28 DIAGNOSIS — G894 Chronic pain syndrome: Secondary | ICD-10-CM | POA: Diagnosis not present

## 2019-06-28 DIAGNOSIS — M150 Primary generalized (osteo)arthritis: Secondary | ICD-10-CM | POA: Diagnosis not present

## 2019-06-28 DIAGNOSIS — M961 Postlaminectomy syndrome, not elsewhere classified: Secondary | ICD-10-CM | POA: Diagnosis not present

## 2019-06-28 DIAGNOSIS — Z79891 Long term (current) use of opiate analgesic: Secondary | ICD-10-CM | POA: Diagnosis not present

## 2019-06-29 DIAGNOSIS — M25551 Pain in right hip: Secondary | ICD-10-CM | POA: Diagnosis not present

## 2019-06-29 DIAGNOSIS — S72001D Fracture of unspecified part of neck of right femur, subsequent encounter for closed fracture with routine healing: Secondary | ICD-10-CM | POA: Diagnosis not present

## 2019-07-06 IMAGING — US US EXTREM LOW VENOUS*R*
1 series · 13 of 24 positions shown · non-contrast
Comparison: None.

CLINICAL DATA: 67-year-old with right leg swelling. Evaluate for
DVT. Recent right femur fracture.



[Series 1: us extrem low venous*right* · 0.07mm/px · 13 of 34 slices shown]
[im 1/34]
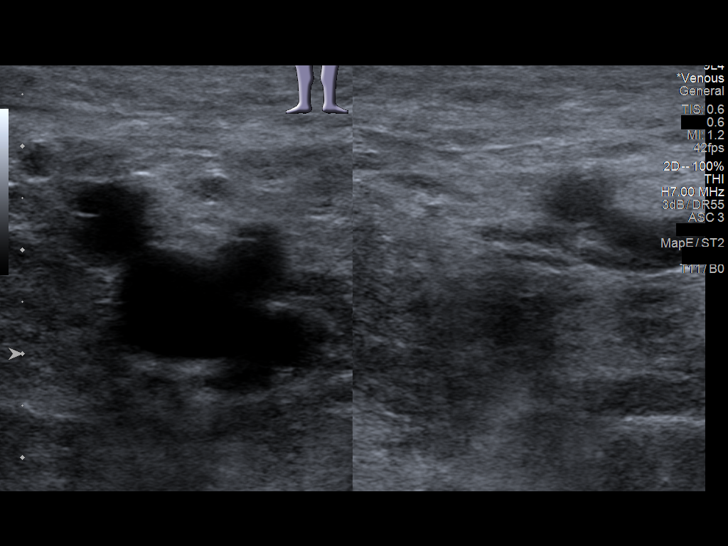
[im 3/34]
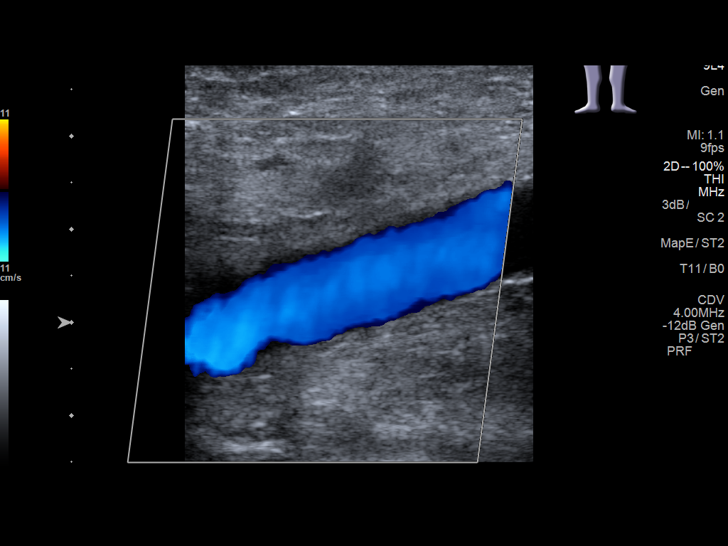
[im 6/34]
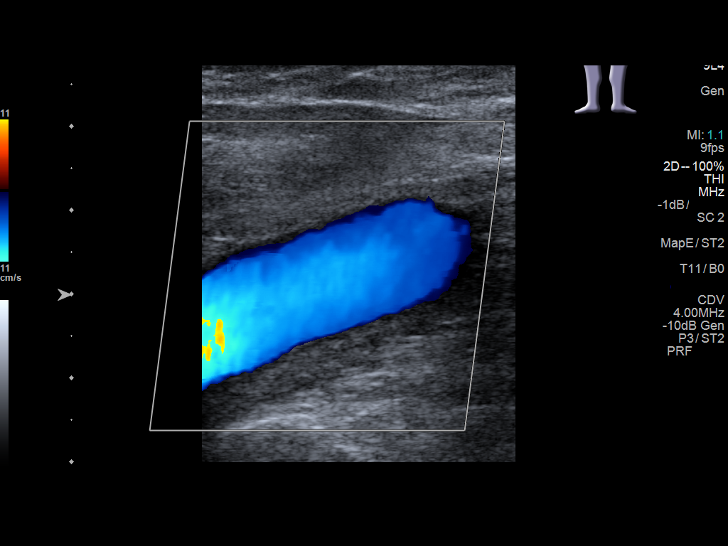
[im 9/34]
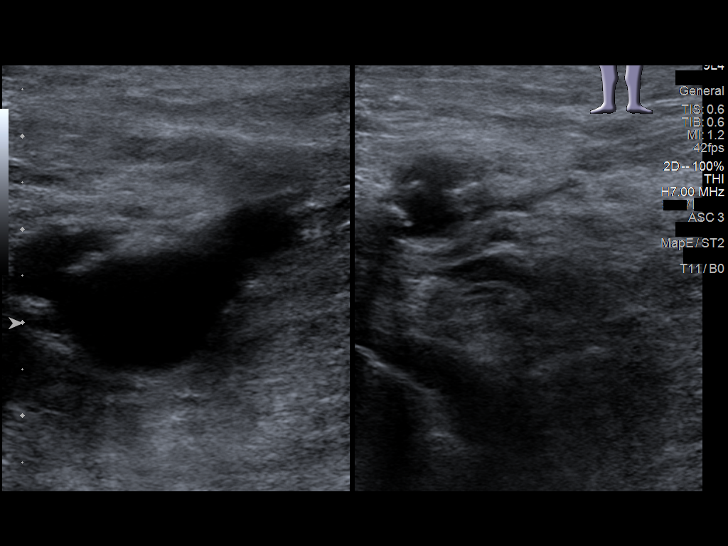
[im 12/34]
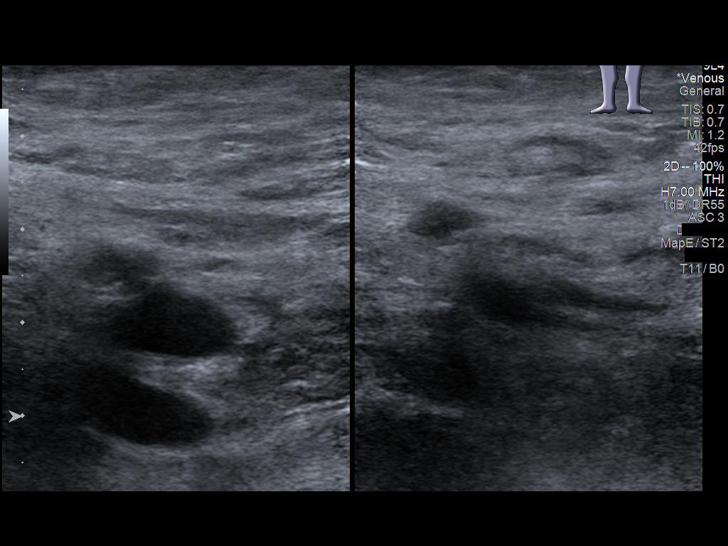
[im 15/34]
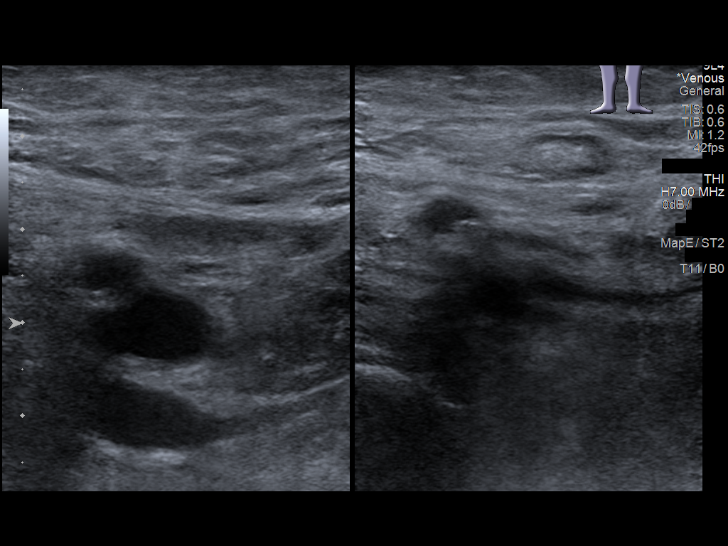
[im 18/34]
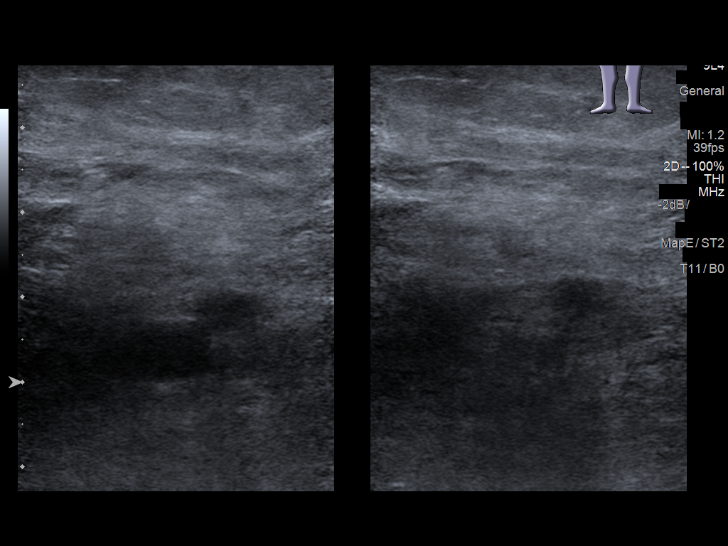
[im 19/34]
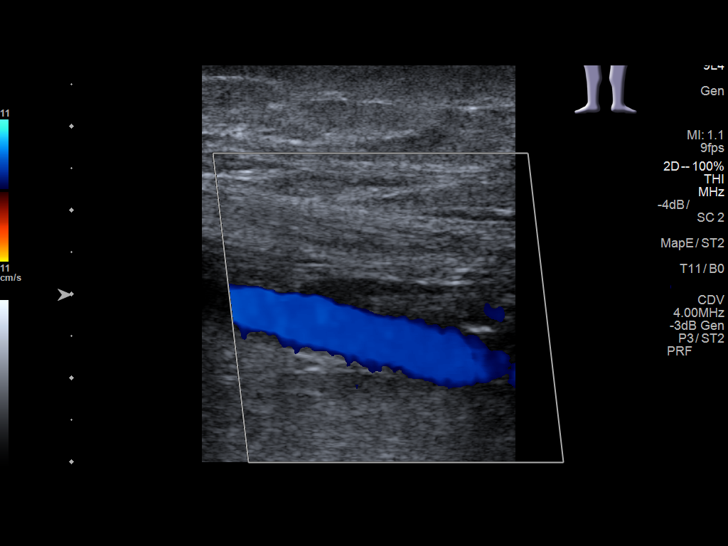
[im 22/34]
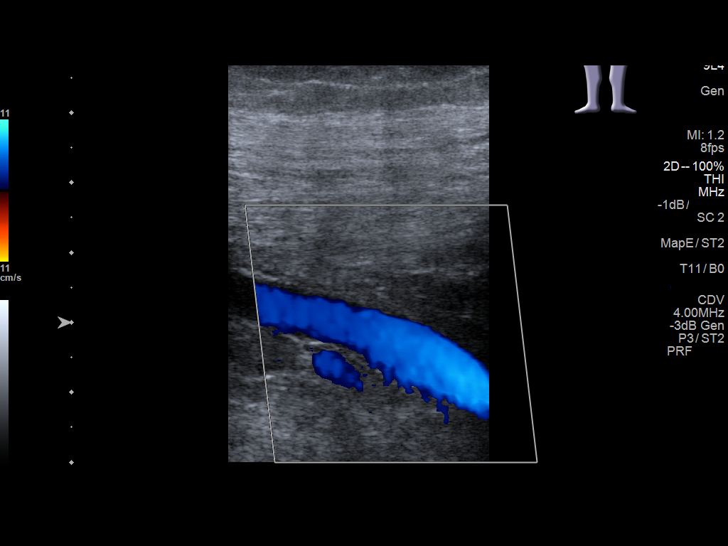
[im 25/34]
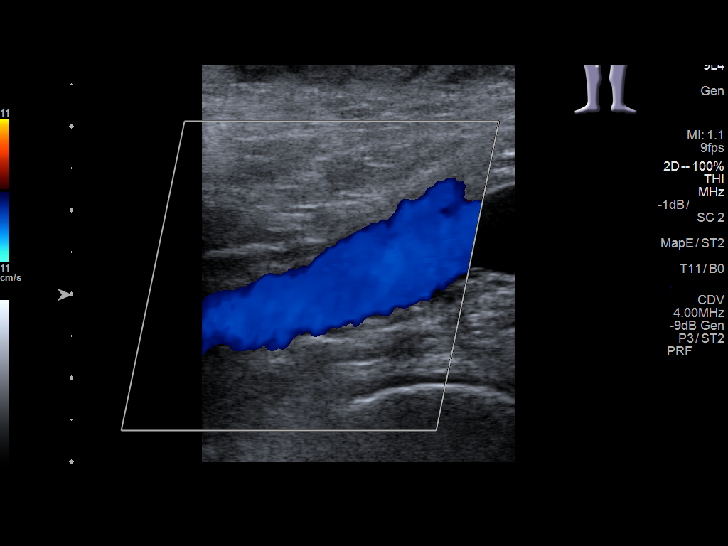
[im 28/34]
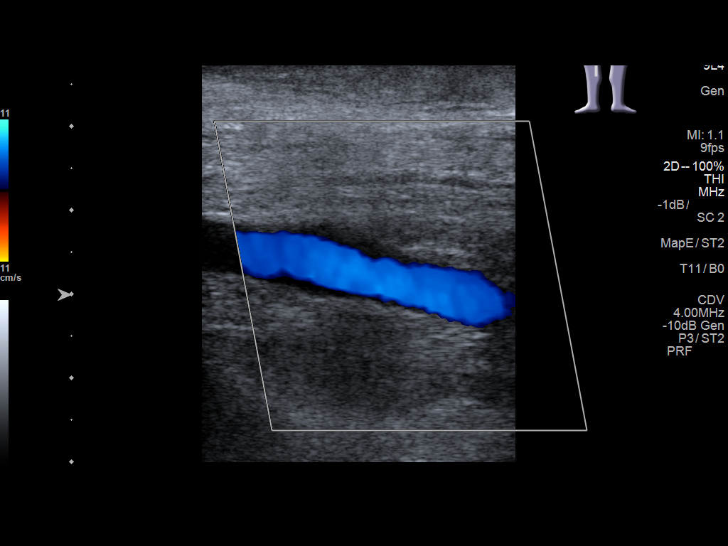
[im 31/34]
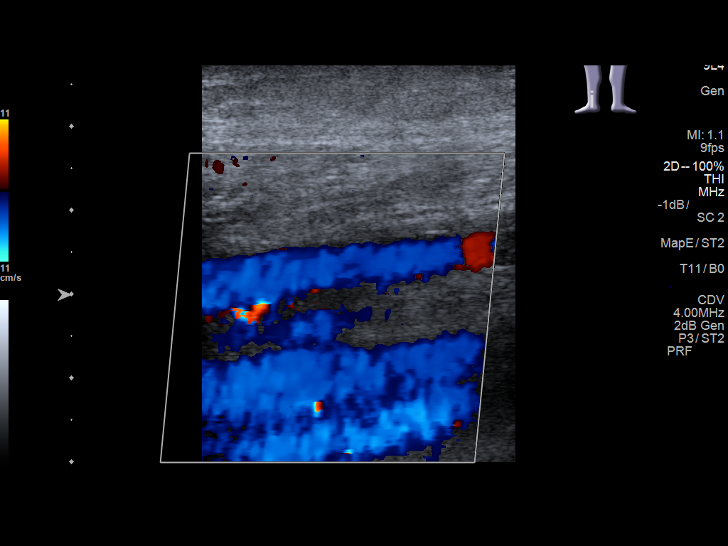
[im 34/34]
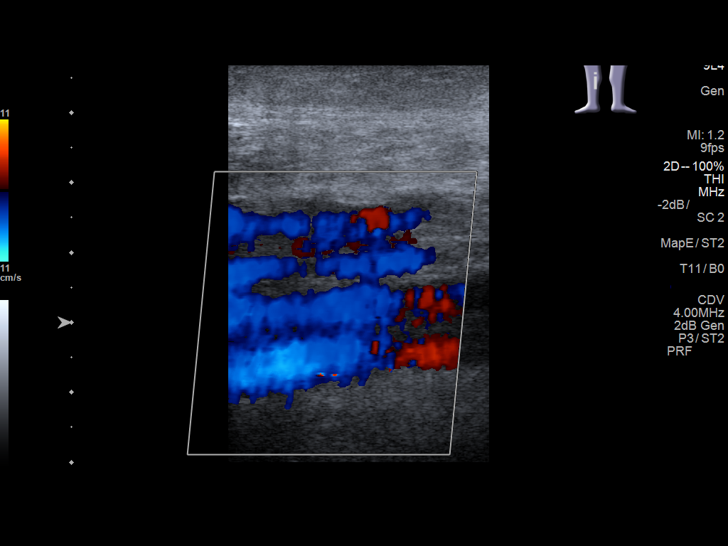

[13 of 24 positions shown; findings below may reference images not displayed]

FINDINGS: Contralateral Common Femoral Vein: Respiratory phasicity is normal
and symmetric with the symptomatic side. No evidence of thrombus.
Normal compressibility.

Common Femoral Vein: No evidence of thrombus. Normal
compressibility, respiratory phasicity and response to augmentation.

Saphenofemoral Junction: No evidence of thrombus. Normal
compressibility and flow on color Doppler imaging.

Profunda Femoral Vein: No evidence of thrombus. Normal
compressibility and flow on color Doppler imaging.

Femoral Vein: No evidence of thrombus. Normal compressibility,
respiratory phasicity and response to augmentation.

Popliteal Vein: No evidence of thrombus. Normal compressibility,
respiratory phasicity and response to augmentation.

Calf Veins: No evidence of thrombus. Normal compressibility and flow
on color Doppler imaging.

Venous Reflux:  None.

Other Findings:  None.
IMPRESSION: Negative for deep venous thrombosis in right lower extremity.

## 2019-07-12 ENCOUNTER — Ambulatory Visit (INDEPENDENT_AMBULATORY_CARE_PROVIDER_SITE_OTHER): Payer: Medicare HMO | Admitting: Psychiatry

## 2019-07-12 ENCOUNTER — Other Ambulatory Visit: Payer: Self-pay

## 2019-07-12 DIAGNOSIS — F341 Dysthymic disorder: Secondary | ICD-10-CM | POA: Diagnosis not present

## 2019-07-12 DIAGNOSIS — R69 Illness, unspecified: Secondary | ICD-10-CM | POA: Diagnosis not present

## 2019-07-12 DIAGNOSIS — F411 Generalized anxiety disorder: Secondary | ICD-10-CM | POA: Diagnosis not present

## 2019-07-13 ENCOUNTER — Encounter (HOSPITAL_COMMUNITY): Payer: Self-pay | Admitting: Psychiatry

## 2019-07-13 NOTE — Progress Notes (Signed)
Virtual Visit via Video Note  I connected with Denise King on 07/13/19 at  4:00 PM EDT by a video enabled telemedicine application and verified that I am speaking with the correct person using two identifiers.  Location: Patient: Denise King Provider: Lise Auer, LCSW   I discussed the limitations of evaluation and management by telemedicine and the availability of in person appointments. The patient expressed understanding and agreed to proceed.  History of Present Illness: Dysthymia and GAD   Observations/Objective: Counselor met with Denise King for individual therapy via Webex. Counselor assessed MH symptoms and progress on treatment plan goals. Denise King denied suicidal ideation or self-harm behaviors. Denise King shared that her depression and anxiety have increased. Counselor encouraged Denise King to contact her psychiatrist to address medication levels. Denise King shared that the daughter who lives with her and her husband has started using drugs again, has not secured employment, is not treating her mental health and has become aggressive with them in the home. Brooks expressed feelings of helplessness and hopelessness with this situation. Denise King shared that she is seeking medical attention for ongoing pain in her legs that is debilitating, causing significant difficulties in walking and getting around. Counselor provided [employer] with cognitive coping skills, community resources, and promoted boundary setting and self-care. Denise King will follow up with doctors and will look into community resources.   Assessment and Plan: Counselor will continue to meet with Denise King to address treatment plan goals. Denise King will continue to follow recommendations of providers and implement skills learned in session.  Follow Up Instructions: Counselor will send information for next session via Webex.     I discussed the assessment and treatment plan with the patient. The patient was provided an opportunity to ask  questions and all were answered. The patient agreed with the plan and demonstrated an understanding of the instructions.   The patient was advised to call back or seek an in-person evaluation if the symptoms worsen or if the condition fails to improve as anticipated.  I provided 38 minutes of non-face-to-face time during this encounter.   Lise Auer, LCSW

## 2019-07-19 ENCOUNTER — Other Ambulatory Visit: Payer: Self-pay | Admitting: *Deleted

## 2019-07-19 DIAGNOSIS — E538 Deficiency of other specified B group vitamins: Secondary | ICD-10-CM

## 2019-07-19 DIAGNOSIS — M25551 Pain in right hip: Secondary | ICD-10-CM | POA: Diagnosis not present

## 2019-07-19 DIAGNOSIS — D751 Secondary polycythemia: Secondary | ICD-10-CM

## 2019-07-19 DIAGNOSIS — M5416 Radiculopathy, lumbar region: Secondary | ICD-10-CM | POA: Diagnosis not present

## 2019-07-19 DIAGNOSIS — S72001A Fracture of unspecified part of neck of right femur, initial encounter for closed fracture: Secondary | ICD-10-CM | POA: Diagnosis not present

## 2019-07-20 ENCOUNTER — Other Ambulatory Visit: Payer: Self-pay

## 2019-07-20 IMAGING — CT CT HEAD W/O CM
3 of 4 series · 15 of 47 positions shown, 18 images · non-contrast
Comparison: None.

CLINICAL DATA: Head trauma, minor, GCS>=13, high clinical risk,
initial exam. Trip over dog and fall.

EXAM:
CT HEAD WITHOUT CONTRAST
TECHNIQUE: Contiguous axial images were obtained from the base of the skull
through the vertex without intravenous contrast.

[Series 2: head w/o · axial · non-contrast · 0.45mm/px · z∈[-174,-59]mm · 9 of 29 slices shown, 12 images]
[im 3/29  brain]
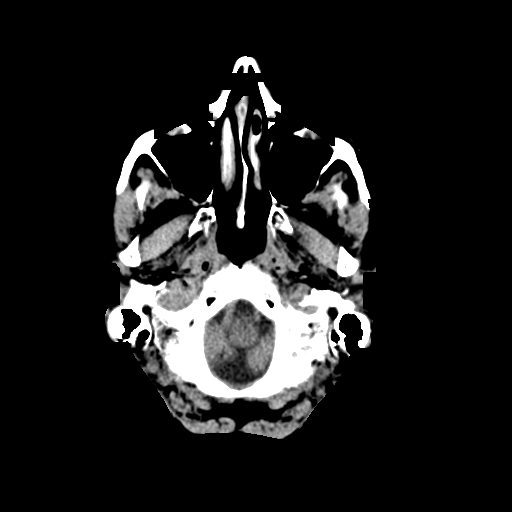
[im 3/29  bone]
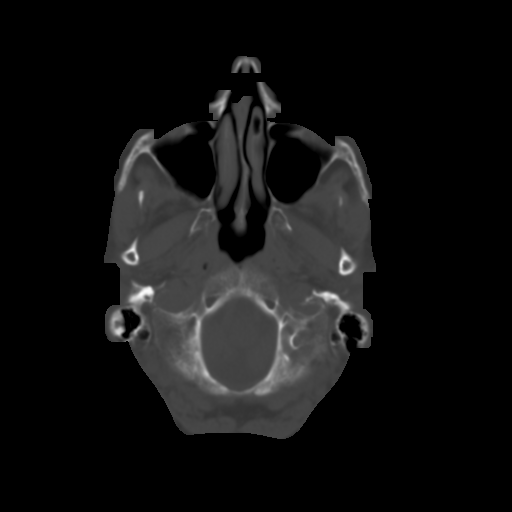
[im 6/29  brain]
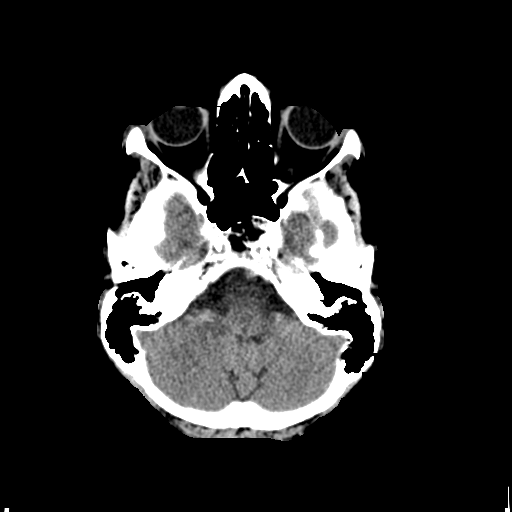
[im 9/29  brain]
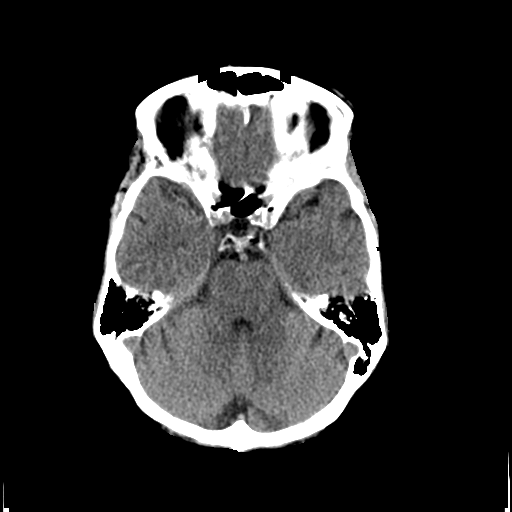
[im 12/29  brain]
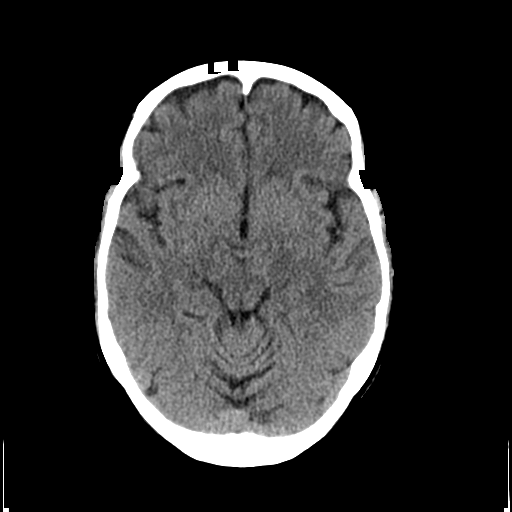
[im 15/29  brain]
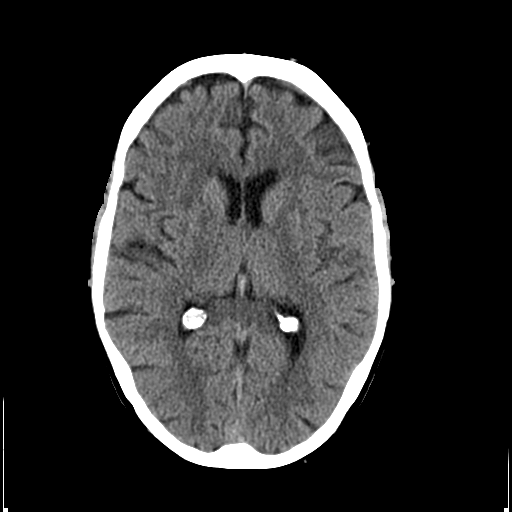
[im 15/29  bone]
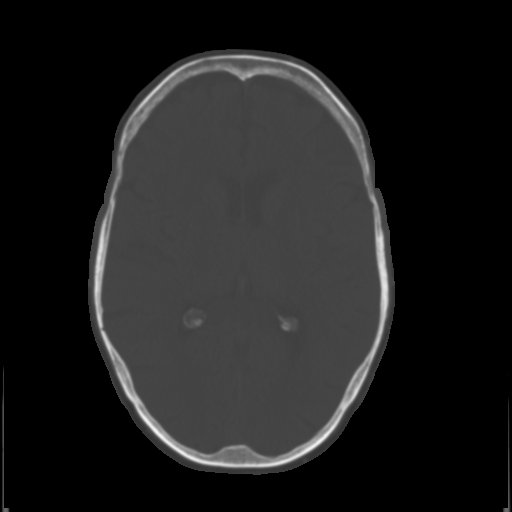
[im 17/29  brain]
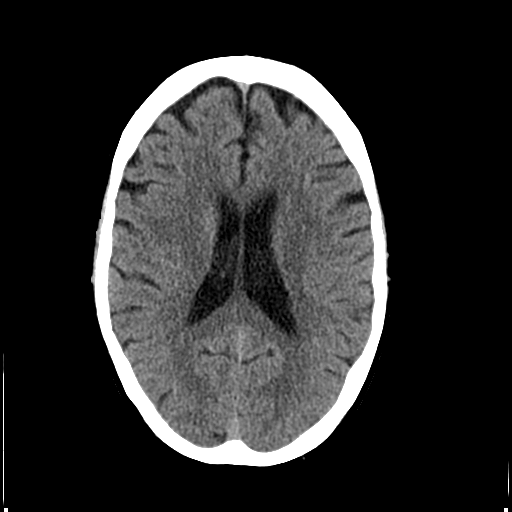
[im 20/29  brain]
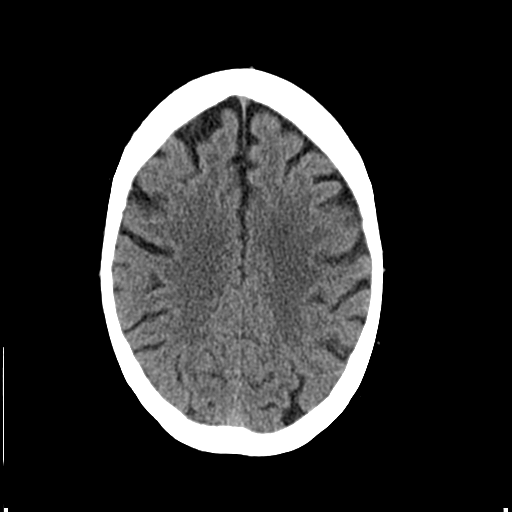
[im 23/29  brain]
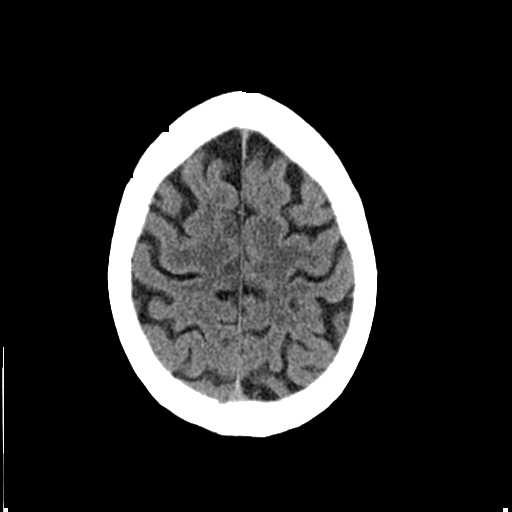
[im 26/29  brain]
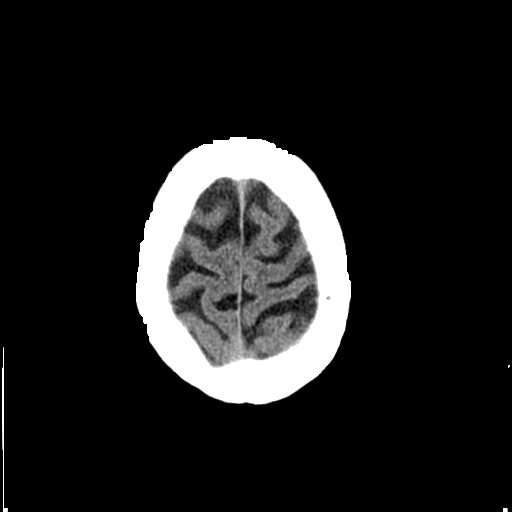
[im 26/29  bone]
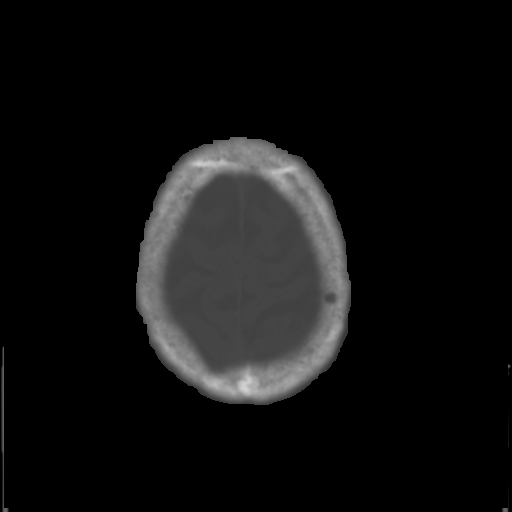

[Series 5: coronal · coronal · 0.28mm/px · 3 of 63 slices shown]
[im 21/63  brain]
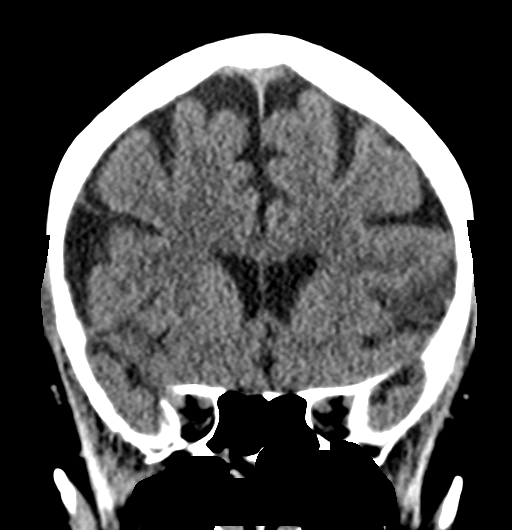
[im 28/63  brain]
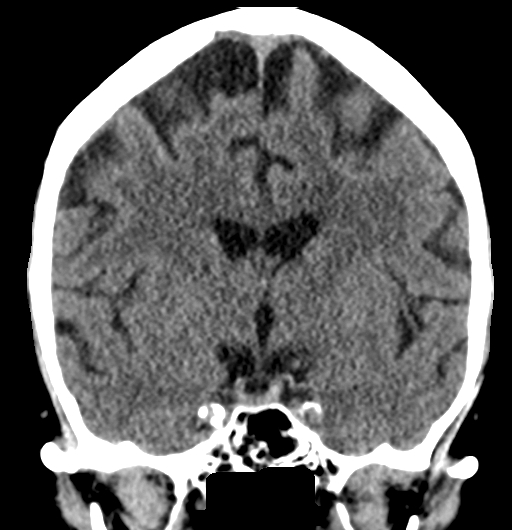
[im 35/63  brain]
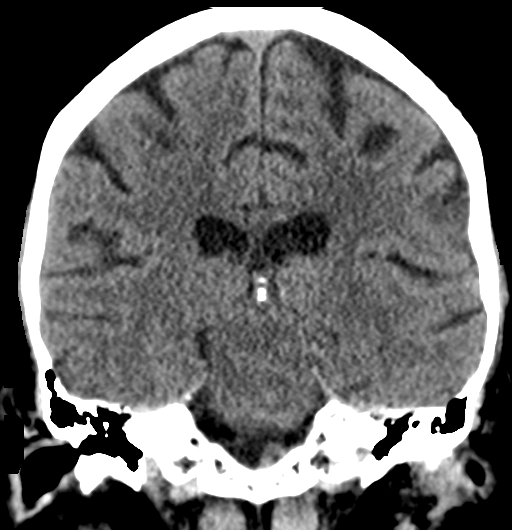

[Series 6: sagittal · sagittal · 0.32mm/px · 3 of 46 slices shown]
[im 16/46  brain]
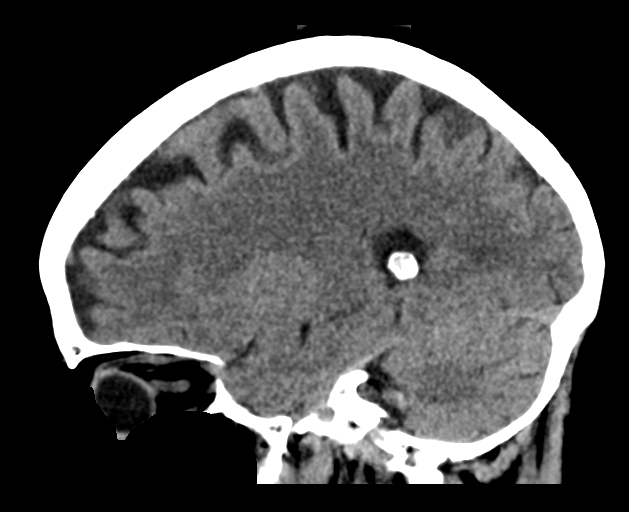
[im 23/46  brain]
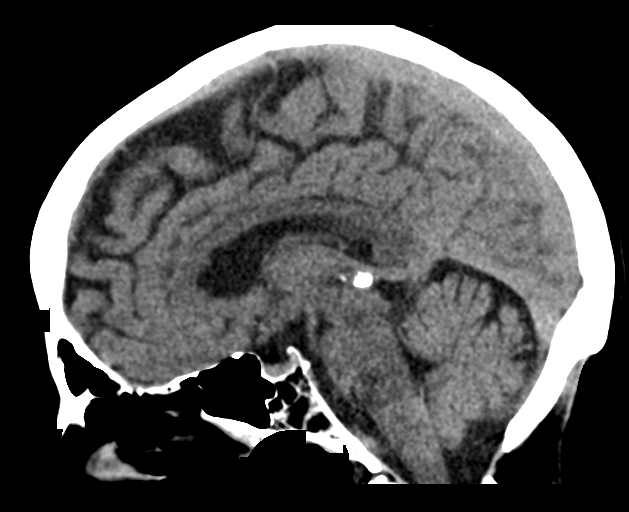
[im 31/46  brain]
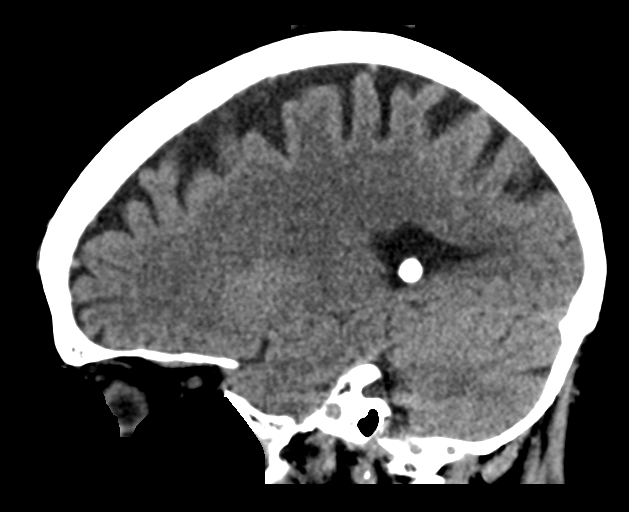

[15 of 47 positions shown; findings below may reference images not displayed]

FINDINGS: Brain: Normal for age atrophy. Mild chronic small vessel ischemia.
No intracranial hemorrhage, mass effect, or midline shift. No
hydrocephalus. The basilar cisterns are patent. No evidence of
territorial infarct or acute ischemia. No extra-axial or
intracranial fluid collection.

Vascular: Atherosclerosis of skullbase vasculature without
hyperdense vessel or abnormal calcification.

Skull: No fracture or focal lesion.

Sinuses/Orbits: Paranasal sinuses and mastoid air cells are clear.
The visualized orbits are unremarkable.

Other: None.
IMPRESSION: No acute intracranial abnormality.

## 2019-07-21 ENCOUNTER — Inpatient Hospital Stay (HOSPITAL_BASED_OUTPATIENT_CLINIC_OR_DEPARTMENT_OTHER): Payer: Medicare HMO | Admitting: Internal Medicine

## 2019-07-21 ENCOUNTER — Encounter: Payer: Self-pay | Admitting: Internal Medicine

## 2019-07-21 ENCOUNTER — Other Ambulatory Visit: Payer: Self-pay

## 2019-07-21 ENCOUNTER — Inpatient Hospital Stay: Payer: Medicare HMO | Attending: Internal Medicine

## 2019-07-21 DIAGNOSIS — Z79899 Other long term (current) drug therapy: Secondary | ICD-10-CM

## 2019-07-21 DIAGNOSIS — R69 Illness, unspecified: Secondary | ICD-10-CM | POA: Diagnosis not present

## 2019-07-21 DIAGNOSIS — F17210 Nicotine dependence, cigarettes, uncomplicated: Secondary | ICD-10-CM | POA: Diagnosis not present

## 2019-07-21 DIAGNOSIS — D751 Secondary polycythemia: Secondary | ICD-10-CM | POA: Diagnosis not present

## 2019-07-21 DIAGNOSIS — Z7982 Long term (current) use of aspirin: Secondary | ICD-10-CM

## 2019-07-21 DIAGNOSIS — E538 Deficiency of other specified B group vitamins: Secondary | ICD-10-CM

## 2019-07-21 DIAGNOSIS — M25551 Pain in right hip: Secondary | ICD-10-CM | POA: Diagnosis not present

## 2019-07-21 LAB — CBC WITH DIFFERENTIAL/PLATELET
Abs Immature Granulocytes: 0.01 10*3/uL (ref 0.00–0.07)
Basophils Absolute: 0 10*3/uL (ref 0.0–0.1)
Basophils Relative: 0 %
Eosinophils Absolute: 0 10*3/uL (ref 0.0–0.5)
Eosinophils Relative: 0 %
HCT: 54.1 % — ABNORMAL HIGH (ref 36.0–46.0)
Hemoglobin: 17.6 g/dL — ABNORMAL HIGH (ref 12.0–15.0)
Immature Granulocytes: 0 %
Lymphocytes Relative: 36 %
Lymphs Abs: 3.2 10*3/uL (ref 0.7–4.0)
MCH: 31.7 pg (ref 26.0–34.0)
MCHC: 32.5 g/dL (ref 30.0–36.0)
MCV: 97.5 fL (ref 80.0–100.0)
Monocytes Absolute: 0.7 10*3/uL (ref 0.1–1.0)
Monocytes Relative: 8 %
Neutro Abs: 5 10*3/uL (ref 1.7–7.7)
Neutrophils Relative %: 56 %
Platelets: 250 10*3/uL (ref 150–400)
RBC: 5.55 MIL/uL — ABNORMAL HIGH (ref 3.87–5.11)
RDW: 15.9 % — ABNORMAL HIGH (ref 11.5–15.5)
WBC: 9 10*3/uL (ref 4.0–10.5)
nRBC: 0 % (ref 0.0–0.2)

## 2019-07-21 LAB — BASIC METABOLIC PANEL
Anion gap: 9 (ref 5–15)
BUN: 5 mg/dL — ABNORMAL LOW (ref 8–23)
CO2: 28 mmol/L (ref 22–32)
Calcium: 8.6 mg/dL — ABNORMAL LOW (ref 8.9–10.3)
Chloride: 101 mmol/L (ref 98–111)
Creatinine, Ser: 0.64 mg/dL (ref 0.44–1.00)
GFR calc Af Amer: >60 mL/min (ref >60)
GFR calc non Af Amer: >60 mL/min (ref >60)
Glucose, Bld: 94 mg/dL (ref 70–99)
Potassium: 4.1 mmol/L (ref 3.5–5.1)
Sodium: 138 mmol/L (ref 135–145)

## 2019-07-21 NOTE — Progress Notes (Signed)
[facility] NOTE  Patient Care Team: Biagio Borg, MD as PCP - General (Internal Medicine) Josue Hector, MD as PCP - Cardiology (Cardiology)  CHIEF COMPLAINTS/PURPOSE OF CONSULTATION: Erythrocytosis  #  ERYTHROCYTOSIS Denise King since 2009]-October 2019-Jak 2/erythropoietin level-normal.   # Hx of Pernicious anemia  # Smoker-active smoker   Oncology History   No history exists.     HISTORY OF PRESENTING ILLNESS:  Denise King 68 y.o.  female history of erythrocytosis thought to be secondary is here for follow-up.  Patient continues to complain of right lower extremity status post fracture status post ORIF.  Unfortunately continues to smoke. Patient also had cardiac work-up CTA-that showed coronary lesions.  However patient had previous cath.  She denies any headaches but denies any strokes.  Chronic mild fatigue.  Chronic shortness of breath.  Review of Systems  Constitutional: Positive for malaise/fatigue. Negative for chills, diaphoresis, fever and weight loss.  HENT: Negative for nosebleeds and sore throat.   Eyes: Negative for double vision.  Respiratory: Positive for cough and shortness of breath. Negative for hemoptysis, sputum production and wheezing.   Cardiovascular: Negative for chest pain, palpitations, orthopnea and leg swelling.  Gastrointestinal: Negative for abdominal pain, blood in stool, constipation, diarrhea, heartburn, melena, nausea and vomiting.  Genitourinary: Negative for dysuria, frequency and urgency.  Musculoskeletal: Positive for back pain and joint pain.  Skin: Negative.  Negative for itching and rash.  Neurological: Negative for dizziness, tingling, focal weakness, weakness and headaches.  Endo/Heme/Allergies: Does not bruise/bleed easily.  Psychiatric/Behavioral: Negative for depression. The patient is not nervous/anxious and does not have insomnia.      MEDICAL HISTORY:  Past Medical History:  Diagnosis Date   . Acute cystitis   . Acute respiratory failure ([place])   . Allergy    as a child grew out of them  . Anemia    pernicious anemia  . Anxiety   . Arthritis   . Back pain   . Blood transfusion   . CAP (community acquired pneumonia)   . Chronic pain syndrome   . Depression   . Diverticulosis of colon   . Dysthymia   . Esophagitis   . EtOH dependence ([employer])   . Gastritis   . GERD (gastroesophageal reflux disease)   . H/O chest pain Dec. 2013   no work up done  . Hx of colonic polyps   . Hypertension   . Irritable bowel syndrome   . Osteopenia   . Osteoporosis   . Pneumonia 2019  . Polypharmacy   . Reflux esophagitis   . Right sided sciatica 12/22/2017  . Tobacco use disorder   . Unspecified chronic bronchitis ([place])   . Vertigo   . Vitamin B12 deficiency   . Wears glasses     SURGICAL HISTORY: Past Surgical History:  Procedure Laterality Date  . APPENDECTOMY    . BACK SURGERY  10-09   Dr. Patrice Paradise  . CARPAL TUNNEL RELEASE Right 08/14/2014   Procedure: RIGHT CARPAL TUNNEL RELEASE AND INJECT LEFT THUMB;  Surgeon: Daryll Brod, MD;  Location: [facility];  Service: Orthopedics;  Laterality: Right;  . COLONOSCOPY    . INTRAMEDULLARY (IM) NAIL INTERTROCHANTERIC Right 10/01/2018   Procedure: INTRAMEDULLARY (IM) NAIL INTERTROCHANTRIC;  Surgeon: Thornton Park, MD;  Location: [facility];  Service: Orthopedics;  Laterality: Right;  . LAPAROSCOPY N/A 02/21/2013   Procedure: LAPAROSCOPY OPERATIVE;  Surgeon: Margarette Asal, MD;  Location: [facility];  Service: Gynecology;  Laterality:  N/A;  REQUESTING 5MM SCOPE WITH CAMERA  . LEFT HEART CATH AND CORONARY ANGIOGRAPHY N/A 05/11/2019   Procedure: LEFT HEART CATH AND CORONARY ANGIOGRAPHY;  Surgeon: Sherren Mocha, MD;  Location: [facility];  Service: Cardiovascular;  Laterality: N/A;  . TONSILLECTOMY    . TUBAL LIGATION    . UPPER GASTROINTESTINAL ENDOSCOPY      SOCIAL HISTORY: Social History   Socioeconomic History   . Marital status: Married    Spouse name: Not on file  . Number of children: 4  . Years of education: 65  . Highest education level: Not on file  Occupational History  . Occupation: retired    Comment: disablility  Social Needs  . Financial resource strain: Not very hard  . Food insecurity    Worry: Never true    Inability: Never true  . Transportation needs    Medical: No    Non-medical: No  Tobacco Use  . Smoking status: Current Every Day Smoker    Packs/day: 1.00    Years: 30.00    Pack years: 30.00    Types: Cigarettes  . Smokeless tobacco: Never Used  Substance and Sexual Activity  . Alcohol use: Yes    Alcohol/week: 3.0 standard drinks    Types: 3 Standard drinks or equivalent per week    Comment: occasional  . Drug use: No  . Sexual activity: Yes  Lifestyle  . Physical activity    Days per week: 0 days    Minutes per session: 0 min  . Stress: Rather much  Relationships  . Social connections    Talks on phone: More than three times a week    Gets together: Once a week    Attends religious service: Never    Active member of club or organization: No    Attends meetings of clubs or organizations: Never    Relationship status: Married  . Intimate partner violence    Fear of current or ex partner: No    Emotionally abused: No    Physically abused: No    Forced sexual activity: No  Other Topics Concern  . Not on file  Social History Narrative   Married x 38 yrs. Has BS degree in Horticulture from [employer]. Currently resides in a house with her husband. 3 cats.  Fun: used to like to do a lot of things.    Denies religious beliefs that would effect health care.    lives with husband [name]; quit smoking- October 6th, 2019. ocasional alcohol. [employer] [2009] on disability sec to back issues.     FAMILY HISTORY: Family History  Problem Relation Age of Onset  . Colon cancer Father   . Prostate cancer Father   . Heart disease Mother         prev MVR, also has DJD  . Colon cancer Brother   . Skin cancer Sister   . Alcohol abuse Daughter   . Bipolar disorder Daughter   . Drug abuse Daughter   . Esophageal cancer Neg Hx   . Rectal cancer Neg Hx   . Stomach cancer Neg Hx     ALLERGIES:  is allergic to atorvastatin; levofloxacin; omnicef [cefdinir]; trazodone and nefazodone; sulfa antibiotics; and sulfonamide derivatives.  MEDICATIONS:  Current Outpatient Medications  Medication Sig Dispense Refill  . ARIPiprazole (ABILIFY) 2 MG tablet TAKE 1 TABLET BY MOUTH DAILY. 30 tablet 0  . aspirin 81 MG chewable tablet Chew 1 tablet (81 mg total) by mouth daily. [place]  tablet 0  . cholestyramine (QUESTRAN) 4 GM/DOSE powder Take 1 packet (4 g total) by mouth 2 (two) times daily with a meal. (Patient taking differently: Take 4 g by mouth 2 (two) times daily as needed ([name]). With meals) 378 g 12  . clonazePAM (KLONOPIN) 1 MG tablet Take 1 tablet (1 mg total) by mouth at bedtime. daily at bedtime PRN 90 tablet 1  . doxylamine, Sleep, (UNISOM) 25 MG tablet Take 25 mg by mouth at bedtime as needed for sleep.    . folic acid (FOLVITE) 1 MG tablet Take 1 tablet (1 mg total) by mouth daily. 90 tablet 3  . hyoscyamine (ANASPAZ) 0.125 MG TBDP disintergrating tablet Place 0.125 mg under the tongue every 6 (six) hours as needed ([name] cramps).     . metoprolol tartrate (LOPRESSOR) 100 MG tablet Take 1 tablet by mouth 2 hours prior to the ct 1 tablet 3  . morphine (MS CONTIN) 60 MG 12 hr tablet Take 60 mg by mouth every 12 (twelve) hours.    . Oxycodone HCl 10 MG TABS Take 10 mg by mouth every 4 (four) hours.     . pantoprazole (PROTONIX) 40 MG tablet Take 1 tablet (40 mg total) by mouth daily. 30 tablet 0  . sertraline (ZOLOFT) 100 MG tablet Take 1.5 tablets (150 mg total) by mouth daily. 135 tablet 0  . triamcinolone cream (KENALOG) 0.1 % Apply 1 application topically 2 (two) times daily as needed (itching).     . valACYclovir (VALTREX) 500 MG tablet Take  500 mg by mouth 2 (two) times daily as needed (flare ups).    . vitamin B-12 (CYANOCOBALAMIN) 100 MCG tablet Take 1 tablet (100 mcg total) by mouth daily. 90 tablet 3   No current facility-administered medications for this visit.    Facility-Administered Medications Ordered in Other Visits  Medication Dose Route Frequency Provider Last Rate Last Dose  . sodium chloride flush (NS) 0.9 % injection 3 mL  3 mL Intravenous Q12H Josue Hector, MD          .  PHYSICAL EXAMINATION:  Vitals:   07/21/19 1010  BP: 114/76  Pulse: 88  Resp: 18  Temp: 98.8 F (37.1 C)   Filed Weights   07/21/19 1010  Weight: 113 lb (51.3 kg)    Physical Exam  Constitutional: She is oriented to person, place, and time and well-developed, well-nourished, and in no distress.  Thin built Caucasian female patient.  Walking with a limp because of right hip pain.  HENT:  Head: Normocephalic and atraumatic.  Mouth/Throat: Oropharynx is clear and moist. No oropharyngeal exudate.  Eyes: Pupils are equal, round, and reactive to light.  Neck: Normal range of motion. Neck supple.  Cardiovascular: Normal rate and regular rhythm.  Pulmonary/Chest: No respiratory distress. She has no wheezes.  Decreased air entry bilaterally.  No wheeze or crackles  Abdominal: Soft. Bowel sounds are normal. She exhibits no distension and no mass. There is no abdominal tenderness. There is no rebound and no guarding.  Musculoskeletal: Normal range of motion.        General: No tenderness or edema.  Neurological: She is alert and oriented to person, place, and time.  Skin: Skin is warm.  Psychiatric: Affect normal.     LABORATORY DATA:  I have reviewed the data as listed Lab Results  Component Value Date   WBC 9.0 07/21/2019   HGB 17.6 (H) 07/21/2019   HCT 54.1 (H) 07/21/2019   MCV 97.5  07/21/2019   PLT 250 07/21/2019   Recent Labs    04/10/19 1035 04/11/19 0518 04/19/19 1347 05/09/19 1551 07/21/19 0957  NA 140 141  139 134 138  K 3.7 3.7 4.2 4.3 4.1  CL 106 101 100 97 101  CO2 25 28 31 28 28   GLUCOSE 93 91 102* 113* 94  BUN <5* <5* 4* 6* 5*  CREATININE 0.60 0.74 0.67 0.60 0.64  CALCIUM 8.4* 8.7* 8.9 9.1 8.6*  GFRNONAA >60 >60  --  96 >60  GFRAA >60 >60  --  111 >60  PROT 5.5* 5.9* 7.0  --   --   ALBUMIN 3.0* 3.1* 3.7  --   --   AST 17 17 14   --   --   ALT 8 8 7   --   --   ALKPHOS 70 63 98  --   --   BILITOT 0.7 0.7 0.5  --   --   BILIDIR  --   --  0.1  --   --     RADIOGRAPHIC STUDIES: I have personally reviewed the radiological images as listed and agreed with the findings in the report. No results found.  ASSESSMENT & PLAN:   Erythrocytosis # Erythrocytosis-isolated hematocrit/hemoglobin-elevated for the last 9 years.  Suspect secondary causes [smoking]; rather than primary [jak-2 NEG/Normal EPO.  Recommend checking Jak-2 exon 14.  #Today hemoglobin is 17 /hematocrit 54.  Discussed regarding phlebotomies.;  However role for phlebotomy for secondary erythrocytosis is unclear.  Hold off phlebotomy today.   # Smoker-Discussed with the patient regarding the ill effects of smoking- including but not limited to cardiac lung and vascular diseases and malignancies. Counseled against smoking.  #Discussed lung cancer screening program-patient declines/states that she would want to follow-up with her PCP.  # DISPOSITION:  # labs- cbc/Jak- exon12 [ordered]; possible phlebotomy  # Follow up in 6 months- MD/ cbc/bmp/possible Phlebotomy- Dr.B  # 25 minutes face-to-face with the patient discussing the above plan of care; more than 50% of time spent on prognosis/ natural history; counseling and coordination.     All questions were answered. The patient knows to call the clinic with any problems, questions or concerns.    Cammie Sickle, MD 07/21/2019 11:43 AM

## 2019-07-21 NOTE — Assessment & Plan Note (Addendum)
#   Erythrocytosis-isolated hematocrit/hemoglobin-elevated for the last 9 years.  Suspect secondary causes [smoking]; rather than primary [jak-2 NEG/Normal EPO.  Recommend checking Jak-2 exon 14.  #Today hemoglobin is 17 /hematocrit 54.  Discussed regarding phlebotomies.;  However role for phlebotomy for secondary erythrocytosis is unclear.  Hold off phlebotomy today.   # Smoker-Discussed with the patient regarding the ill effects of smoking- including but not limited to cardiac lung and vascular diseases and malignancies. Counseled against smoking.  #Discussed lung cancer screening program-patient declines/states that she would want to follow-up with her PCP.  # DISPOSITION:  # labs- cbc/Jak- exon12 [ordered]; possible phlebotomy  # Follow up in 6 months- MD/ cbc/bmp/possible Phlebotomy- Dr.B  # 25 minutes face-to-face with the patient discussing the above plan of care; more than 50% of time spent on prognosis/ natural history; counseling and coordination.

## 2019-07-23 ENCOUNTER — Encounter: Payer: Self-pay | Admitting: Internal Medicine

## 2019-07-24 DIAGNOSIS — G894 Chronic pain syndrome: Secondary | ICD-10-CM | POA: Diagnosis not present

## 2019-07-24 DIAGNOSIS — M961 Postlaminectomy syndrome, not elsewhere classified: Secondary | ICD-10-CM | POA: Diagnosis not present

## 2019-07-24 DIAGNOSIS — Z79891 Long term (current) use of opiate analgesic: Secondary | ICD-10-CM | POA: Diagnosis not present

## 2019-07-24 DIAGNOSIS — M25551 Pain in right hip: Secondary | ICD-10-CM | POA: Diagnosis not present

## 2019-07-24 MED ORDER — BUSPIRONE HCL 10 MG PO TABS: 10.0000 mg | ORAL_TABLET | Freq: Three times a day (TID) | ORAL | 5 refills | Status: DC

## 2019-07-25 ENCOUNTER — Other Ambulatory Visit: Payer: Self-pay | Admitting: Orthopedic Surgery

## 2019-07-25 DIAGNOSIS — M5416 Radiculopathy, lumbar region: Secondary | ICD-10-CM

## 2019-07-28 DIAGNOSIS — M25551 Pain in right hip: Secondary | ICD-10-CM | POA: Diagnosis not present

## 2019-07-31 ENCOUNTER — Other Ambulatory Visit: Payer: Self-pay | Admitting: Orthopedic Surgery

## 2019-07-31 ENCOUNTER — Ambulatory Visit
Admission: RE | Admit: 2019-07-31 | Discharge: 2019-07-31 | Disposition: A | Discharge status: Home / Self Care | Payer: Medicare HMO | Source: Ambulatory Visit | Attending: Orthopedic Surgery | Admitting: Orthopedic Surgery

## 2019-07-31 ENCOUNTER — Other Ambulatory Visit: Payer: Self-pay

## 2019-07-31 DIAGNOSIS — M25551 Pain in right hip: Secondary | ICD-10-CM | POA: Insufficient documentation

## 2019-07-31 DIAGNOSIS — S72101D Unspecified trochanteric fracture of right femur, subsequent encounter for closed fracture with routine healing: Secondary | ICD-10-CM | POA: Diagnosis not present

## 2019-08-03 DIAGNOSIS — M545 Low back pain: Secondary | ICD-10-CM | POA: Diagnosis not present

## 2019-08-03 DIAGNOSIS — M4326 Fusion of spine, lumbar region: Secondary | ICD-10-CM | POA: Diagnosis not present

## 2019-08-03 DIAGNOSIS — M5136 Other intervertebral disc degeneration, lumbar region: Secondary | ICD-10-CM | POA: Diagnosis not present

## 2019-08-03 DIAGNOSIS — G894 Chronic pain syndrome: Secondary | ICD-10-CM | POA: Diagnosis not present

## 2019-08-03 DIAGNOSIS — M810 Age-related osteoporosis without current pathological fracture: Secondary | ICD-10-CM | POA: Diagnosis not present

## 2019-08-06 ENCOUNTER — Encounter: Payer: Self-pay | Admitting: Internal Medicine

## 2019-08-06 DIAGNOSIS — R69 Illness, unspecified: Secondary | ICD-10-CM | POA: Diagnosis not present

## 2019-08-06 DIAGNOSIS — M129 Arthropathy, unspecified: Secondary | ICD-10-CM | POA: Diagnosis not present

## 2019-08-06 DIAGNOSIS — Z1159 Encounter for screening for other viral diseases: Secondary | ICD-10-CM | POA: Diagnosis not present

## 2019-08-06 DIAGNOSIS — M25561 Pain in right knee: Secondary | ICD-10-CM | POA: Diagnosis not present

## 2019-08-07 NOTE — Telephone Encounter (Signed)
Staff to contact pt - needs OV for knee pain

## 2019-08-08 ENCOUNTER — Ambulatory Visit: Payer: Medicare HMO | Admitting: Internal Medicine

## 2019-08-09 ENCOUNTER — Other Ambulatory Visit: Payer: Self-pay

## 2019-08-09 ENCOUNTER — Ambulatory Visit (INDEPENDENT_AMBULATORY_CARE_PROVIDER_SITE_OTHER): Payer: Medicare HMO | Admitting: Internal Medicine

## 2019-08-09 ENCOUNTER — Encounter: Payer: Self-pay | Admitting: Internal Medicine

## 2019-08-09 VITALS — BP 116/68 | HR 89 | Ht 63.0 in | Wt 116.0 lb

## 2019-08-09 DIAGNOSIS — Z0000 Encounter for general adult medical examination without abnormal findings: Secondary | ICD-10-CM

## 2019-08-09 DIAGNOSIS — M25561 Pain in right knee: Secondary | ICD-10-CM

## 2019-08-09 DIAGNOSIS — S7011XA Contusion of right thigh, initial encounter: Secondary | ICD-10-CM

## 2019-08-09 DIAGNOSIS — R739 Hyperglycemia, unspecified: Secondary | ICD-10-CM

## 2019-08-09 DIAGNOSIS — E559 Vitamin D deficiency, unspecified: Secondary | ICD-10-CM | POA: Diagnosis not present

## 2019-08-09 DIAGNOSIS — E611 Iron deficiency: Secondary | ICD-10-CM | POA: Diagnosis not present

## 2019-08-09 DIAGNOSIS — R69 Illness, unspecified: Secondary | ICD-10-CM | POA: Diagnosis not present

## 2019-08-09 DIAGNOSIS — E538 Deficiency of other specified B group vitamins: Secondary | ICD-10-CM

## 2019-08-09 DIAGNOSIS — S7010XA Contusion of unspecified thigh, initial encounter: Secondary | ICD-10-CM | POA: Insufficient documentation

## 2019-08-09 DIAGNOSIS — F329 Major depressive disorder, single episode, unspecified: Secondary | ICD-10-CM | POA: Diagnosis not present

## 2019-08-09 DIAGNOSIS — F32A Depression, unspecified: Secondary | ICD-10-CM

## 2019-08-09 NOTE — Assessment & Plan Note (Signed)
Large bruising nontender noted to lateral thigh I suspect related to the probable rupturing at the lateral knee, cont to follow

## 2019-08-09 NOTE — Progress Notes (Signed)
Subjective:    Patient ID: Denise King, female    DOB: 18-Mar-1951, 68 y.o.   MRN: [id number]  HPI  Here to f/u with c/o 4 days right knee pain, primarily to the lateral right knee, associated with marked bruising to the lateral right thigh as well, seemingly happening spontaneously without fall or other trauma.  No prior hx, does not take an anticoagulant  Pain now 7/10 but declines pain med due to pain contract.  Walking with cane today as pain much worse with walking. Was seen per UC recently where pt mentioned right knee swelling, and per pt was felt to not have acute gouty arthritis as uric acid was normal.   Pt denies chest pain, increased sob or doe, wheezing, orthopnea, PND, increased LE swelling, palpitations, dizziness or syncope. Pt denies new neurological symptoms such as new headache, or facial or extremity weakness or numbness    Pt denies polydipsia, polyuria  Denies worsening depressive symptoms, suicidal ideation, or panic Past Medical History:  Diagnosis Date  . Acute cystitis   . Acute respiratory failure ([place])   . Allergy    as a child grew out of them  . Anemia    pernicious anemia  . Anxiety   . Arthritis   . Back pain   . Blood transfusion   . CAP (community acquired pneumonia)   . Chronic pain syndrome   . Depression   . Diverticulosis of colon   . Dysthymia   . Esophagitis   . EtOH dependence ([place])   . Gastritis   . GERD (gastroesophageal reflux disease)   . H/O chest pain Dec. 2013   no work up done  . Hx of colonic polyps   . Hypertension   . Irritable bowel syndrome   . Osteopenia   . Osteoporosis   . Pneumonia 2019  . Polypharmacy   . Reflux esophagitis   . Right sided sciatica 12/22/2017  . Tobacco use disorder   . Unspecified chronic bronchitis ([place])   . Vertigo   . Vitamin B12 deficiency   . Wears glasses    Past Surgical History:  Procedure Laterality Date  . APPENDECTOMY    . BACK SURGERY  10-09   Dr. Patrice Paradise  . CARPAL TUNNEL  RELEASE Right 08/14/2014   Procedure: RIGHT CARPAL TUNNEL RELEASE AND INJECT LEFT THUMB;  Surgeon: Daryll Brod, MD;  Location: [facility];  Service: Orthopedics;  Laterality: Right;  . COLONOSCOPY    . INTRAMEDULLARY (IM) NAIL INTERTROCHANTERIC Right 10/01/2018   Procedure: INTRAMEDULLARY (IM) NAIL INTERTROCHANTRIC;  Surgeon: Thornton Park, MD;  Location: [facility];  Service: Orthopedics;  Laterality: Right;  . LAPAROSCOPY N/A 02/21/2013   Procedure: LAPAROSCOPY OPERATIVE;  Surgeon: Margarette Asal, MD;  Location: [facility];  Service: Gynecology;  Laterality: N/A;  REQUESTING 5MM SCOPE WITH CAMERA  . LEFT HEART CATH AND CORONARY ANGIOGRAPHY N/A 05/11/2019   Procedure: LEFT HEART CATH AND CORONARY ANGIOGRAPHY;  Surgeon: Sherren Mocha, MD;  Location: [facility];  Service: Cardiovascular;  Laterality: N/A;  . TONSILLECTOMY    . TUBAL LIGATION    . UPPER GASTROINTESTINAL ENDOSCOPY      reports that she has been smoking cigarettes. She has a 30.00 pack-year smoking history. She has never used smokeless tobacco. She reports current alcohol use of about 3.0 standard drinks of alcohol per week. She reports that she does not use drugs. family history includes Alcohol abuse in her daughter; Bipolar disorder in her daughter;  Colon cancer in her brother and father; Drug abuse in her daughter; Heart disease in her mother; Prostate cancer in her father; Skin cancer in her sister. Allergies  Allergen Reactions  . Atorvastatin Nausea And Vomiting  . Levofloxacin Other (See Comments)    Reaction: achilles tendon pain  . Omnicef [Cefdinir] Diarrhea    Uncontrollable diarrhea  . Trazodone And Nefazodone Other (See Comments)    "Felt like I was going to faint"  . Sulfa Antibiotics Rash  . Sulfonamide Derivatives Rash   Current Outpatient Medications on File Prior to Visit  Medication Sig Dispense Refill  . ARIPiprazole (ABILIFY) 2 MG tablet TAKE 1 TABLET BY MOUTH DAILY. 30 tablet 0  .  aspirin 81 MG chewable tablet Chew 1 tablet (81 mg total) by mouth daily. 30 tablet 0  . busPIRone (BUSPAR) 10 MG tablet Take 1 tablet (10 mg total) by mouth 3 (three) times daily. 90 tablet 5  . cholestyramine (QUESTRAN) 4 GM/DOSE powder Take 1 packet (4 g total) by mouth 2 (two) times daily with a meal. (Patient taking differently: Take 4 g by mouth 2 (two) times daily as needed ([name]). With meals) 378 g 12  . doxylamine, Sleep, (UNISOM) 25 MG tablet Take 25 mg by mouth at bedtime as needed for sleep.    . folic acid (FOLVITE) 1 MG tablet Take 1 tablet (1 mg total) by mouth daily. 90 tablet 3  . hyoscyamine (ANASPAZ) 0.125 MG TBDP disintergrating tablet Place 0.125 mg under the tongue every 6 (six) hours as needed ([name] cramps).     . metoprolol tartrate (LOPRESSOR) 100 MG tablet Take 1 tablet by mouth 2 hours prior to the ct 1 tablet 3  . morphine (MS CONTIN) 60 MG 12 hr tablet Take 60 mg by mouth every 12 (twelve) hours.    . Oxycodone HCl 10 MG TABS Take 10 mg by mouth every 4 (four) hours.     . pantoprazole (PROTONIX) 40 MG tablet Take 1 tablet (40 mg total) by mouth daily. 30 tablet 0  . sertraline (ZOLOFT) 100 MG tablet Take 1.5 tablets (150 mg total) by mouth daily. 135 tablet 0  . triamcinolone cream (KENALOG) 0.1 % Apply 1 application topically 2 (two) times daily as needed (itching).     . valACYclovir (VALTREX) 500 MG tablet Take 500 mg by mouth 2 (two) times daily as needed (flare ups).    . vitamin B-12 (CYANOCOBALAMIN) 100 MCG tablet Take 1 tablet (100 mcg total) by mouth daily. 90 tablet 3   Current Facility-Administered Medications on File Prior to Visit  Medication Dose Route Frequency Provider Last Rate Last Dose  . sodium chloride flush (NS) 0.9 % injection 3 mL  3 mL Intravenous Q12H Josue Hector, MD       Review of Systems  Constitutional: Negative for other unusual diaphoresis or sweats HENT: Negative for ear discharge or swelling Eyes: Negative for other worsening  visual disturbances Respiratory: Negative for stridor or other swelling  Gastrointestinal: Negative for worsening distension or other blood Genitourinary: Negative for retention or other urinary change Musculoskeletal: Negative for other MSK pain or swelling Skin: Negative for color change or other new lesions Neurological: Negative for worsening tremors and other numbness  Psychiatric/Behavioral: Negative for worsening agitation or other fatigue All other system neg per pt    Objective:   Physical Exam BP 116/68   Pulse 89   Ht 5\' 3"  (1.6 m)   Wt 116 lb (52.6 kg)  SpO2 94%   BMI 20.55 kg/m  VS noted, not ill appearing Constitutional: Pt appears in NAD HENT: Head: NCAT.  Right Ear: External ear normal.  Left Ear: External ear normal.  Eyes: . Pupils are equal, round, and reactive to light. Conjunctivae and EOM are normal Nose: without d/c or deformity Neck: Neck supple. Gross normal ROM Cardiovascular: Normal rate and regular rhythm.   Pulmonary/Chest: Effort normal and breath sounds without rales or wheezing.  Right lateral thigh with nontender but large area of bruising without other overlying skin change Right knee with 1-2+ effusion, but has warmth and tenderness only over the lateral quadricep insertion site area Neurological: Pt is alert. At baseline orientation, motor grossly intact Skin: Skin is warm. No rashes, other new lesions, no LE edema Psychiatric: Pt behavior is normal without agitation  No other exam findings Lab Results  Component Value Date   WBC 9.0 07/21/2019   HGB 17.6 (H) 07/21/2019   HCT 54.1 (H) 07/21/2019   PLT 250 07/21/2019   GLUCOSE 94 07/21/2019   CHOL 128 04/09/2019   TRIG 52 04/09/2019   HDL 47 04/09/2019   LDLDIRECT 102.4 05/24/2008   LDLCALC 71 04/09/2019   ALT 7 04/19/2019   AST 14 04/19/2019   NA 138 07/21/2019   K 4.1 07/21/2019   CL 101 07/21/2019   CREATININE 0.64 07/21/2019   BUN 5 (L) 07/21/2019   CO2 28 07/21/2019   TSH  0.499 04/08/2019   INR 0.82 10/19/2018   HGBA1C 5.2 10/01/2018       Assessment & Plan:

## 2019-08-09 NOTE — Assessment & Plan Note (Signed)
stable overall by history and exam, recent data reviewed with pt, and pt to continue medical treatment as before,  to f/u any worsening symptoms or concerns  

## 2019-08-09 NOTE — Patient Instructions (Addendum)
Please continue all other medications as before, and refills have been done if requested.  Please have the pharmacy call with any other refills you may need.  Please continue your efforts at being more active, low cholesterol diet, and weight control.  Please keep your appointments with your specialists as you may have planned  You will be contacted regarding the referral for: Dr Tamala Julian for possible quadricep muscle tear  Please return in 1 month for your yearly visit, or sooner if needed, with Lab testing done 3-5 days before

## 2019-08-09 NOTE — Assessment & Plan Note (Signed)
Most likely this is lateral quadricep tendon rupture given the hx and exam, cont same pain med, cane asd, and urgent referral Dr Tamala Julian, may need ortho if confirmed for surgical repair

## 2019-08-15 NOTE — Progress Notes (Signed)
[facility] [place], [place] [address] Phone: [phone] Subjective:   [name], am serving as a scribe for Dr. Hulan Saas.     CC: Knee pain  Denise King  Denise King is a 68 y.o. female coming in with complaint of right knee pain. Patient states that she woke up 2 weeks ago with bruising throughout the right leg. Knee was swollen and sore. Pain over quad tendon. Pain has not improved. Pain today is sharp if she flexes knee and is 8/10. The day prior to ecchymosis patient had a physical therapy session for her back. Rode the bike and did table exercises as well as some stair walking.  Patient is able to bear weight but has difficulty.    Past Medical History:  Diagnosis Date   Acute cystitis    Acute respiratory failure ([place])    Allergy    as a child grew out of them   Anemia    pernicious anemia   Anxiety    Arthritis    Back pain    Blood transfusion    CAP (community acquired pneumonia)    Chronic pain syndrome    Depression    Diverticulosis of colon    Dysthymia    Esophagitis    EtOH dependence ([place])    Gastritis    GERD (gastroesophageal reflux disease)    H/O chest pain Dec. 2013   no work up done   Hx of colonic polyps    Hypertension    Irritable bowel syndrome    Osteopenia    Osteoporosis    Pneumonia 2019   Polypharmacy    Reflux esophagitis    Right sided sciatica 12/22/2017   Tobacco use disorder    Unspecified chronic bronchitis (H[facility])    Vertigo    Vitamin B12 deficiency    Wears glasses    Past Surgical History:  Procedure Laterality Date   APPENDECTOMY     BACK SURGERY  10-09   Dr. Patrice Paradise   CARPAL TUNNEL RELEASE Right 08/14/2014   Procedure: RIGHT CARPAL TUNNEL RELEASE AND INJECT LEFT THUMB;  Surgeon: Daryll Brod, MD;  Location: [facility];  Service: Orthopedics;  Laterality: Right;   COLONOSCOPY     INTRAMEDULLARY (IM) NAIL  INTERTROCHANTERIC Right 10/01/2018   Procedure: INTRAMEDULLARY (IM) NAIL INTERTROCHANTRIC;  Surgeon: Thornton Park, MD;  Location: [facility];  Service: Orthopedics;  Laterality: Right;   LAPAROSCOPY N/A 02/21/2013   Procedure: LAPAROSCOPY OPERATIVE;  Surgeon: Margarette Asal, MD;  Location: [facility];  Service: Gynecology;  Laterality: N/A;  REQUESTING 5MM SCOPE WITH CAMERA   LEFT HEART CATH AND CORONARY ANGIOGRAPHY N/A 05/11/2019   Procedure: LEFT HEART CATH AND CORONARY ANGIOGRAPHY;  Surgeon: Sherren Mocha, MD;  Location: [facility];  Service: Cardiovascular;  Laterality: N/A;   TONSILLECTOMY     TUBAL LIGATION     UPPER GASTROINTESTINAL ENDOSCOPY     Social History   Socioeconomic History   Marital status: Married    Spouse name: Not on file   Number of children: 4   Years of education: 16   Highest education level: Not on file  Occupational History   Occupation: retired    Comment: Armed forces technical officer strain: Not very hard   Food insecurity    Worry: Never true    Inability: Never true   Transportation needs    Medical: No    Non-medical:  No  Tobacco Use   Smoking status: Current Every Day Smoker    Packs/day: 1.00    Years: 30.00    Pack years: 30.00    Types: Cigarettes   Smokeless tobacco: Never Used  Substance and Sexual Activity   Alcohol use: Yes    Alcohol/week: 3.0 standard drinks    Types: 3 Standard drinks or equivalent per week    Comment: occasional   Drug use: No   Sexual activity: Yes  Lifestyle   Physical activity    Days per week: 0 days    Minutes per session: 0 min   Stress: Rather much  Relationships   Social connections    Talks on phone: More than three times a week    Gets together: Once a week    Attends religious service: Never    Active member of club or organization: No    Attends meetings of clubs or organizations: Never    Relationship status: Married  Other Topics Concern     Not on file  Social History Narrative   Married x 38 yrs. Has BS degree in Horticulture from [employer]. Currently resides in a house with her husband. 3 cats.  Fun: used to like to do a lot of things.    Denies religious beliefs that would effect health care.    lives with husband Denise King; quit smoking- October 6th, 2019. ocasional alcohol. [employer] [2009] on disability sec to back issues.    Allergies  Allergen Reactions   Atorvastatin Nausea And Vomiting   Levofloxacin Other (See Comments)    Reaction: achilles tendon pain   Omnicef [Cefdinir] Diarrhea    Uncontrollable diarrhea   Trazodone And Nefazodone Other (See Comments)    "Felt like I was going to faint"   Sulfa Antibiotics Rash   Sulfonamide Derivatives Rash   Family History  Problem Relation Age of Onset   Colon cancer Father    Prostate cancer Father    Heart disease Mother        prev MVR, also has DJD   Colon cancer Brother    Skin cancer Sister    Alcohol abuse Daughter    Bipolar disorder Daughter    Drug abuse Daughter    Esophageal cancer Neg Hx    Rectal cancer Neg Hx    Stomach cancer Neg Hx       Current Outpatient Medications (Cardiovascular):    cholestyramine (QUESTRAN) 4 GM/DOSE powder, Take 1 packet (4 g total) by mouth 2 (two) times daily with a meal. (Patient taking differently: Take 4 g by mouth 2 (two) times daily as needed ([name]). With meals)   metoprolol tartrate (LOPRESSOR) 100 MG tablet, Take 1 tablet by mouth 2 hours prior to the ct     Current Outpatient Medications (Analgesics):    aspirin 81 MG chewable tablet, Chew 1 tablet (81 mg total) by mouth daily.   morphine (MS CONTIN) 60 MG 12 hr tablet, Take 60 mg by mouth every 12 (twelve) hours.   Oxycodone HCl 10 MG TABS, Take 10 mg by mouth every 4 (four) hours.    Current Outpatient Medications (Hematological):    folic acid (FOLVITE) 1 MG tablet, Take 1 tablet (1 mg total) by mouth daily.    vitamin B-12 (CYANOCOBALAMIN) 100 MCG tablet, Take 1 tablet (100 mcg total) by mouth daily.   Current Outpatient Medications (Other):    ARIPiprazole (ABILIFY) 2 MG tablet, TAKE 1 TABLET BY MOUTH DAILY.   busPIRone (BUSPAR)  10 MG tablet, Take 1 tablet (10 mg total) by mouth 3 (three) times daily.   doxylamine, Sleep, (UNISOM) 25 MG tablet, Take 25 mg by mouth at bedtime as needed for sleep.   hyoscyamine (ANASPAZ) 0.125 MG TBDP disintergrating tablet, Place 0.125 mg under the tongue every 6 (six) hours as needed ([name] cramps).    pantoprazole (PROTONIX) 40 MG tablet, Take 1 tablet (40 mg total) by mouth daily.   sertraline (ZOLOFT) 100 MG tablet, Take 1.5 tablets (150 mg total) by mouth daily.   triamcinolone cream (KENALOG) 0.1 %, Apply 1 application topically 2 (two) times daily as needed (itching).    valACYclovir (VALTREX) 500 MG tablet, Take 500 mg by mouth 2 (two) times daily as needed (flare ups).   Facility-Administered Medications Ordered in Other Visits (Other):    sodium chloride flush (NS) 0.9 % injection 3 mL No current facility-administered medications for this visit.     Past medical history, social, surgical and family history all reviewed in electronic medical record.  No pertanent information unless stated regarding to the chief complaint.   Review of Systems:  No headache, visual changes, nausea, vomiting, diarrhea, constipation, dizziness, abdominal pain, skin rash, fevers, chills, night sweats, weight loss, swollen lymph nodes, body aches, joint swelling, , chest pain, shortness of breath, mood changes.  Positive muscle aches  Objective  Blood pressure 102/82, pulse 90, height 5\' 3"  (1.6 m), weight 118 lb (53.5 kg), SpO2 94 %.    General: No apparent distress alert and oriented x3 mood and affect normal, dressed appropriately.  HEENT: Pupils equal, extraocular movements intact  Respiratory: Patient's speak in full sentences and does not appear short of  breath  Cardiovascular: Trace lower extremity edema, non tender, no erythema   Abdomen: Soft nontender  Neuro: Cranial nerves II through XII are intact, neurovascularly intact in all extremities with 2+ DTRs and 2+ pulses.  Lymph: No lymphadenopathy of posterior or anterior cervical chain or axillae bilaterally.  Gait severely antalgic MSK:  Non tender with full range of motion and good stability and symmetric strength and tone of shoulders, elbows, wrist, hip, and ankles bilaterally.  Knee: Right knee Severe bruising noted.  Seems to be across the leg mostly on the lateral aspect about mid thigh down to mid calf.  Does seem to be resolving a little bit. Palpation normal with no warmth, joint line tenderness, patellar tenderness, or condyle tenderness. Unable to flex knee greater than 90 degrees.  Does have full extension noted. Negative Mcmurray's, Apley's, and Thessalonian tests. painful patellar compression. Pain to palpation mid thigh mostly lateral.. Hamstring and quadriceps strength is normal.  MSK US performed of: Right hip This study was ordered, performed, and interpreted by Charlann Boxer D.O.  Knee: Significant hematoma noted of approximately 6 cm distal to the greater trochanteric area on the lateral aspect of the hip and extending 7 cm distally.  Seems to be under the vastus lateralis muscle. Patient's knee has no significant effusion.  Quadricep tendon is intact.  IMPRESSION: Hematoma deep to the vastus lateralis muscle with mild to moderate knee arthritis   Impression and Recommendations:     This case required medical decision making of moderate complexity. The above documentation has been reviewed and is accurate and complete Lyndal Pulley, DO       Note: This dictation was prepared with Dragon dictation along with smaller phrase technology. Any transcriptional errors that result from this process are unintentional.

## 2019-08-16 ENCOUNTER — Other Ambulatory Visit: Payer: Self-pay

## 2019-08-16 ENCOUNTER — Ambulatory Visit: Payer: Medicare HMO | Admitting: Family Medicine

## 2019-08-16 ENCOUNTER — Ambulatory Visit: Payer: Self-pay

## 2019-08-16 ENCOUNTER — Ambulatory Visit (INDEPENDENT_AMBULATORY_CARE_PROVIDER_SITE_OTHER)
Admission: RE | Admit: 2019-08-16 | Discharge: 2019-08-16 | Disposition: A | Discharge status: Home / Self Care | Payer: Medicare HMO | Source: Ambulatory Visit | Attending: Family Medicine | Admitting: Family Medicine

## 2019-08-16 VITALS — BP 102/82 | HR 90 | Ht 63.0 in | Wt 118.0 lb

## 2019-08-16 DIAGNOSIS — M7989 Other specified soft tissue disorders: Secondary | ICD-10-CM | POA: Diagnosis not present

## 2019-08-16 DIAGNOSIS — M25561 Pain in right knee: Secondary | ICD-10-CM | POA: Diagnosis not present

## 2019-08-16 DIAGNOSIS — R2241 Localized swelling, mass and lump, right lower limb: Secondary | ICD-10-CM | POA: Diagnosis not present

## 2019-08-16 DIAGNOSIS — M79651 Pain in right thigh: Secondary | ICD-10-CM | POA: Diagnosis not present

## 2019-08-16 DIAGNOSIS — G8929 Other chronic pain: Secondary | ICD-10-CM

## 2019-08-16 NOTE — Patient Instructions (Addendum)
Thigh compression sleeve Hinged knee brace throughout the day Vitamin D 2000IU daily Arnica lotion for brusing See me again in 2-3 weeks

## 2019-08-16 NOTE — Progress Notes (Signed)
Evaluation Performed: [facility]   Date:  08/29/2019   ID:  Denise King, DOB 1951/10/15, MRN [id number]  Provider Location: Office  PCP:  Biagio Borg, MD  Cardiologist:  [facility] Rouge, MD  Electrophysiologist:  None   Chief Complaint:  Post cath CAD   History of Present Illness:    Denise King is a 68 y.o. female with hospitalization for acute Viral syndrome and URI symptoms and neg COVID lab test.  Resp panel neg.  First seen in consult 04/07/19   She had no history of cardiac disease admitted with 48 hours dry cough and dyspnea then had nausea and vomiting Has had weeks of diarrhea ? Due to [name]. After vomiting had mild central chest pressure  She is a ppd smoker active over 40 years.  Troponin 1.07 WBC mildly elevated 10.7  Troponin remained flat and echo without RWMA.  She was on IV heparin for 48 hours and neg PE on CTA of chest but + CAD.  In mLAD.  Lipids were normal in hospital at 71    Cardiac CTA done 05/09/19 calcium score 1304 99 th percentile with positive FFR CT in RCA/LAD Cath 05/11/19 with only 50% mid RCA and 50% prox/mid LAD Medical Rx recommended   Had PT and got hematoma right thigh and knee Has held aspirin  No motivation to quit smoking   The patient does not have symptoms concerning for COVID-19 infection (fever, chills, cough, or new shortness of breath).    Past Medical History:  Diagnosis Date  . Acute cystitis   . Acute respiratory failure ([place])   . Allergy    as a child grew out of them  . Anemia    pernicious anemia  . Anxiety   . Arthritis   . Back pain   . Blood transfusion   . CAP (community acquired pneumonia)   . Chronic pain syndrome   . Depression   . Diverticulosis of colon   . Dysthymia   . Esophagitis   . EtOH dependence ([place])   . Gastritis   . GERD (gastroesophageal reflux disease)   . H/O chest pain Dec. 2013   no work up done  . Hx of colonic polyps   . Hypertension   . Irritable bowel syndrome   .  Osteopenia   . Osteoporosis   . Pneumonia 2019  . Polypharmacy   . Reflux esophagitis   . Right sided sciatica 12/22/2017  . Tobacco use disorder   . Unspecified chronic bronchitis ([place])   . Vertigo   . Vitamin B12 deficiency   . Wears glasses    Past Surgical History:  Procedure Laterality Date  . APPENDECTOMY    . BACK SURGERY  10-09   Dr. Patrice Paradise  . CARPAL TUNNEL RELEASE Right 08/14/2014   Procedure: RIGHT CARPAL TUNNEL RELEASE AND INJECT LEFT THUMB;  Surgeon: Daryll Brod, MD;  Location: [facility];  Service: Orthopedics;  Laterality: Right;  . COLONOSCOPY    . INTRAMEDULLARY (IM) NAIL INTERTROCHANTERIC Right 10/01/2018   Procedure: INTRAMEDULLARY (IM) NAIL INTERTROCHANTRIC;  Surgeon: Thornton Park, MD;  Location: [facility];  Service: Orthopedics;  Laterality: Right;  . LAPAROSCOPY N/A 02/21/2013   Procedure: LAPAROSCOPY OPERATIVE;  Surgeon: Margarette Asal, MD;  Location: [facility];  Service: Gynecology;  Laterality: N/A;  REQUESTING 5MM SCOPE WITH CAMERA  . LEFT HEART CATH AND CORONARY ANGIOGRAPHY N/A 05/11/2019   Procedure: LEFT HEART CATH AND CORONARY ANGIOGRAPHY;  Surgeon: Burt Knack,  Legrand Como, MD;  Location: [facility];  Service: Cardiovascular;  Laterality: N/A;  . TONSILLECTOMY    . TUBAL LIGATION    . UPPER GASTROINTESTINAL ENDOSCOPY       Current Meds  Medication Sig  . aspirin 81 MG chewable tablet Chew 1 tablet (81 mg total) by mouth daily.  . busPIRone (BUSPAR) 10 MG tablet Take 1 tablet (10 mg total) by mouth 3 (three) times daily.  . cholestyramine (QUESTRAN) 4 GM/DOSE powder Take 1 packet (4 g total) by mouth 2 (two) times daily with a meal. (Patient taking differently: Take 4 g by mouth 2 (two) times daily as needed ([name]). With meals)  . doxylamine, Sleep, (UNISOM) 25 MG tablet Take 25 mg by mouth at bedtime as needed for sleep.  . folic acid (FOLVITE) 1 MG tablet Take 1 tablet (1 mg total) by mouth daily.  . hyoscyamine (ANASPAZ) 0.125 MG TBDP  disintergrating tablet Place 0.125 mg under the tongue every 6 (six) hours as needed ([name] cramps).   . morphine (MS CONTIN) 60 MG 12 hr tablet Take 60 mg by mouth every 12 (twelve) hours.  . Oxycodone HCl 10 MG TABS Take 10 mg by mouth every 4 (four) hours.   . pantoprazole (PROTONIX) 40 MG tablet Take 1 tablet (40 mg total) by mouth daily.  . sertraline (ZOLOFT) 100 MG tablet TAKE 1 & 1/2 TABLETS ONCE DAILY.  Marland Kitchen triamcinolone cream (KENALOG) 0.1 % Apply 1 application topically 2 (two) times daily as needed (itching).   . valACYclovir (VALTREX) 500 MG tablet Take 500 mg by mouth 2 (two) times daily as needed (flare ups).  . vitamin B-12 (CYANOCOBALAMIN) 100 MCG tablet Take 1 tablet (100 mcg total) by mouth daily.     Allergies:   Atorvastatin, Levofloxacin, Omnicef [cefdinir], Trazodone and nefazodone, Sulfa antibiotics, and Sulfonamide derivatives   Social History   Tobacco Use  . Smoking status: Current Every Day Smoker    Packs/day: 1.00    Years: 30.00    Pack years: 30.00    Types: Cigarettes  . Smokeless tobacco: Never Used  Substance Use Topics  . Alcohol use: Yes    Alcohol/week: 3.0 standard drinks    Types: 3 Standard drinks or equivalent per week    Comment: occasional  . Drug use: No     Family Hx: The patient's family history includes Alcohol abuse in her daughter; Bipolar disorder in her daughter; Colon cancer in her brother and father; Drug abuse in her daughter; Heart disease in her mother; Prostate cancer in her father; Skin cancer in her sister. There is no history of Esophageal cancer, Rectal cancer, or Stomach cancer.  ROS:   Please see the history of present illness.    General:no colds or fevers, no weight changes Skin:no rashes or ulcers HEENT:no blurred vision, no congestion CV:see HPI PUL:see HPI GI:no diarrhea now but hx of [name].  no constipation or melena, no indigestion GU:no hematuria, no dysuria MS:no joint pain, no claudication Neuro:no syncope,  no lightheadedness Endo:no diabetes, no thyroid disease hemoc followed by Hematology visit in July.     All other systems reviewed and are negative.   Prior CV studies:   The following studies were reviewed today:  Limited Echo 04/11/19 IMPRESSIONS    1. The left ventricle has normal systolic function, with an ejection fraction of 60-65%. The cavity size was normal. Left ventricular diastolic Doppler parameters are consistent with impaired relaxation.  2. The mitral valve is grossly normal.  3. The tricuspid valve was grossly normal.  4. The aortic valve is tricuspid Aortic valve regurgitation was not assessed by color flow Doppler. No stenosis of the aortic valve.  5. Limited study for LV function; full doppler not performed; normal LV function; mild diastolic dysfunction.  .Cath 05/11/19  Conclusion    Mid RCA lesion is 50% stenosed.  Prox Cx to Mid Cx lesion is 30% stenosed.  Prox LAD to Mid LAD lesion is 50% stenosed.  LV end diastolic pressure is low.   1.  Mild to moderate diffuse calcified stenosis of the proximal to mid LAD 2.  Widely patent left main and left circumflex with calcified nonobstructive disease 3.  Mild to moderate focal stenosis of the mid RCA, with lesion no greater than 50% does not appear flow-limiting 4.  Low LVEDP of 4 mmHg  Recommendations: Recommend medical therapy.  There is no target lesion for PCI.  The patient is a heavy smoker and tobacco cessation is recommended.        Labs/Other Tests and Data Reviewed:    EKG:   SR rate 75 normal 05/11/19   Recent Labs: 04/08/2019: B Natriuretic Peptide 449.8; TSH 0.499 04/19/2019: ALT 7; Magnesium 2.2 07/21/2019: BUN 5; Creatinine, Ser 0.64; Hemoglobin 17.6; Platelets 250; Potassium 4.1; Sodium 138   Recent Lipid Panel Lab Results  Component Value Date/Time   CHOL 128 04/09/2019 06:15 AM   TRIG 52 04/09/2019 06:15 AM   HDL 47 04/09/2019 06:15 AM   CHOLHDL 2.7 04/09/2019 06:15 AM    LDLCALC 71 04/09/2019 06:15 AM   LDLDIRECT 102.4 05/24/2008 10:37 AM    Wt Readings from Last 3 Encounters:  08/29/19 114 lb (51.7 kg)  08/16/19 118 lb (53.5 kg)  08/09/19 116 lb (52.6 kg)     Objective:    Vital Signs:  BP 110/62   Pulse 92   Ht 5\' 3"  (1.6 m)   Wt 114 lb (51.7 kg)   SpO2 96%   BMI 20.19 kg/m    Affect appropriate Healthy:  appears stated age HEENT: normal Neck supple with no adenopathy JVP normal no bruits no thyromegaly Lungs clear with no wheezing and good diaphragmatic motion Heart:  S1/S2 no murmur, no rub, gallop or click PMI normal Abdomen: benighn, BS positve, no tenderness, no AAA no bruit.  No HSM or HJR Distal pulses intact with no bruits No edema Neuro non-focal Skin warm and dry No muscular weakness     ASSESSMENT & PLAN:    1. CAD:  Moderate RCA/LAD by cath 05/11/19 continue ASA beta blocker  2. Chol:  Given high calcium score and moderate CAD should be on statin start crestor 5 mg M/W/F labs in 3 months  3. Acute viral syndrome.  Neg COVID resolved  4. Smoking:  Counseled on cessation lung cancer screening CT per primary  5. GERD per PCP on protonix   [id number] Education: The signs and symptoms of COVID-19 were discussed with the patient and how to seek care for testing (follow up with PCP or arrange E-visit).  The importance of social distancing was discussed today.  Time:   Today, I have spent  30 minutes with patient     Medication Adjustments/Labs and Tests Ordered: Current medicines are reviewed at length with the patient today.  Concerns regarding medicines are outlined above.   Tests Ordered: Orders Placed This Encounter  Procedures  . Hepatic function panel  . Lipid panel    Medication Changes: Meds ordered this encounter  Medications  . rosuvastatin (CRESTOR) 5 MG tablet    Sig: Take 1 tablet (5 mg total) by mouth every Monday, Wednesday, and Friday at 6 PM.    Dispense:  36 tablet    Refill:  3     Disposition:  Follow up in 6 months   Signed, [place] Rouge, MD  08/29/2019 3:47 PM    [place]

## 2019-08-17 ENCOUNTER — Encounter: Payer: Self-pay | Admitting: Family Medicine

## 2019-08-17 NOTE — Assessment & Plan Note (Signed)
Patient's right knee has decreased in flexion but has full extension.  Quadricep tendon appears to be intact but likely a deep fibers tear of the quadricep muscle noted with hematoma.  Patient has some pain in all directions.  Past medical history is significant for a hip fracture but most recent CT scan done did not show any type of instability.  We will repeat x-rays to make sure there has been no other recent injury.  Patient is already on chronic narcotics for other problems including her back pain.  We discussed a compression sleeve and topical anti-inflammatories.  Discussed the possibility of low-dose gabapentin but with patient's other chronic pain medications she would like to avoid it.  Follow-up again in 2 to 3 weeks

## 2019-08-19 ENCOUNTER — Telehealth: Payer: Medicare HMO

## 2019-08-22 DIAGNOSIS — M25551 Pain in right hip: Secondary | ICD-10-CM | POA: Diagnosis not present

## 2019-08-22 DIAGNOSIS — G894 Chronic pain syndrome: Secondary | ICD-10-CM | POA: Diagnosis not present

## 2019-08-22 DIAGNOSIS — Z79891 Long term (current) use of opiate analgesic: Secondary | ICD-10-CM | POA: Diagnosis not present

## 2019-08-22 DIAGNOSIS — M961 Postlaminectomy syndrome, not elsewhere classified: Secondary | ICD-10-CM | POA: Diagnosis not present

## 2019-08-29 ENCOUNTER — Encounter: Payer: Self-pay | Admitting: Cardiovascular Disease

## 2019-08-29 ENCOUNTER — Ambulatory Visit (INDEPENDENT_AMBULATORY_CARE_PROVIDER_SITE_OTHER): Payer: Medicare HMO | Admitting: Cardiovascular Disease

## 2019-08-29 ENCOUNTER — Other Ambulatory Visit (HOSPITAL_COMMUNITY): Payer: Self-pay | Admitting: Psychiatry

## 2019-08-29 ENCOUNTER — Other Ambulatory Visit: Payer: Self-pay

## 2019-08-29 VITALS — BP 110/62 | HR 92 | Ht 63.0 in | Wt 114.0 lb

## 2019-08-29 DIAGNOSIS — I2510 Atherosclerotic heart disease of native coronary artery without angina pectoris: Secondary | ICD-10-CM

## 2019-08-29 DIAGNOSIS — E785 Hyperlipidemia, unspecified: Secondary | ICD-10-CM

## 2019-08-29 MED ORDER — ROSUVASTATIN CALCIUM 5 MG PO TABS: 5.0000 mg | ORAL_TABLET | ORAL | 3 refills | Status: DC

## 2019-08-29 NOTE — Patient Instructions (Signed)
Your physician has recommended you make the following change in your medication:  ROSUVASTATIN 5 MG EVERY   MWF  Your physician recommends that you return for lab work in:  [place] LIVER 3 MONTHS  BEGINNING OF December  Your physician wants you to follow-up in:  [place] will receive a reminder letter in the mail two months in advance. If you don't receive a letter, please call our office to schedule the follow-up appointment.

## 2019-08-31 ENCOUNTER — Ambulatory Visit: Payer: Medicare HMO | Admitting: Family Medicine

## 2019-08-31 NOTE — Progress Notes (Deleted)
[name] [place], [place] [address] Phone: [phone] Subjective:    I'm seeing this patient by the request  of:    CC:   [id number]  Denise King is a 67 y.o. female coming in with complaint of ***  Onset-  Location Duration-  Character- Aggravating factors- Reliving factors-  Therapies tried-  Severity-     Past Medical History:  Diagnosis Date  . Acute cystitis   . Acute respiratory failure ([place])   . Allergy    as a child grew out of them  . Anemia    pernicious anemia  . Anxiety   . Arthritis   . Back pain   . Blood transfusion   . CAP (community acquired pneumonia)   . Chronic pain syndrome   . Depression   . Diverticulosis of colon   . Dysthymia   . Esophagitis   . EtOH dependence ([place])   . Gastritis   . GERD (gastroesophageal reflux disease)   . H/O chest pain Dec. 2013   no work up done  . Hx of colonic polyps   . Hypertension   . Irritable bowel syndrome   . Osteopenia   . Osteoporosis   . Pneumonia 2019  . Polypharmacy   . Reflux esophagitis   . Right sided sciatica 12/22/2017  . Tobacco use disorder   . Unspecified chronic bronchitis ([place])   . Vertigo   . Vitamin B12 deficiency   . Wears glasses    Past Surgical History:  Procedure Laterality Date  . APPENDECTOMY    . BACK SURGERY  10-09   Dr. Patrice Paradise  . CARPAL TUNNEL RELEASE Right 08/14/2014   Procedure: RIGHT CARPAL TUNNEL RELEASE AND INJECT LEFT THUMB;  Surgeon: Daryll Brod, MD;  Location: [facility];  Service: Orthopedics;  Laterality: Right;  . COLONOSCOPY    . INTRAMEDULLARY (IM) NAIL INTERTROCHANTERIC Right 10/01/2018   Procedure: INTRAMEDULLARY (IM) NAIL INTERTROCHANTRIC;  Surgeon: Thornton Park, MD;  Location: [facility];  Service: Orthopedics;  Laterality: Right;  . LAPAROSCOPY N/A 02/21/2013   Procedure: LAPAROSCOPY OPERATIVE;  Surgeon: Margarette Asal, MD;  Location: [facility];  Service: Gynecology;   Laterality: N/A;  REQUESTING 5MM SCOPE WITH CAMERA  . LEFT HEART CATH AND CORONARY ANGIOGRAPHY N/A 05/11/2019   Procedure: LEFT HEART CATH AND CORONARY ANGIOGRAPHY;  Surgeon: Sherren Mocha, MD;  Location: [facility];  Service: Cardiovascular;  Laterality: N/A;  . TONSILLECTOMY    . TUBAL LIGATION    . UPPER GASTROINTESTINAL ENDOSCOPY     Social History   Socioeconomic History  . Marital status: Married    Spouse name: Not on file  . Number of children: 4  . Years of education: 30  . Highest education level: Not on file  Occupational History  . Occupation: retired    Comment: disablility  Social Needs  . Financial resource strain: Not very hard  . Food insecurity    Worry: Never true    Inability: Never true  . Transportation needs    Medical: No    Non-medical: No  Tobacco Use  . Smoking status: Current Every Day Smoker    Packs/day: 1.00    Years: 30.00    Pack years: 30.00    Types: Cigarettes  . Smokeless tobacco: Never Used  Substance and Sexual Activity  . Alcohol use: Yes    Alcohol/week: 3.0 standard drinks    Types: 3 Standard drinks or equivalent  per week    Comment: occasional  . Drug use: No  . Sexual activity: Yes  Lifestyle  . Physical activity    Days per week: 0 days    Minutes per session: 0 min  . Stress: Rather much  Relationships  . Social connections    Talks on phone: More than three times a week    Gets together: Once a week    Attends religious service: Never    Active member of club or organization: No    Attends meetings of clubs or organizations: Never    Relationship status: Married  Other Topics Concern  . Not on file  Social History Narrative   Married x 38 yrs. Has BS degree in Horticulture from [employer]. Currently resides in a house with her husband. 3 cats.  Fun: used to like to do a lot of things.    Denies religious beliefs that would effect health care.    lives with husband [name]; quit smoking- October 6th,  2019. ocasional alcohol. [employer] [2009] on disability sec to back issues.    Allergies  Allergen Reactions  . Atorvastatin Nausea And Vomiting  . Levofloxacin Other (See Comments)    Reaction: achilles tendon pain  . Omnicef [Cefdinir] Diarrhea    Uncontrollable diarrhea  . Trazodone And Nefazodone Other (See Comments)    "Felt like I was going to faint"  . Sulfa Antibiotics Rash  . Sulfonamide Derivatives Rash   Family History  Problem Relation Age of Onset  . Colon cancer Father   . Prostate cancer Father   . Heart disease Mother        prev MVR, also has DJD  . Colon cancer Brother   . Skin cancer Sister   . Alcohol abuse Daughter   . Bipolar disorder Daughter   . Drug abuse Daughter   . Esophageal cancer Neg Hx   . Rectal cancer Neg Hx   . Stomach cancer Neg Hx       Current Outpatient Medications (Cardiovascular):  .  cholestyramine (QUESTRAN) 4 GM/DOSE powder, Take 1 packet (4 g total) by mouth 2 (two) times daily with a meal. (Patient taking differently: Take 4 g by mouth 2 (two) times daily as needed ([name]). With meals) .  metoprolol tartrate (LOPRESSOR) 100 MG tablet, Take 1 tablet by mouth 2 hours prior to the ct .  rosuvastatin (CRESTOR) 5 MG tablet, Take 1 tablet (5 mg total) by mouth every Monday, Wednesday, and Friday at 6 PM.     Current Outpatient Medications (Analgesics):  .  aspirin 81 MG chewable tablet, Chew 1 tablet (81 mg total) by mouth daily. Marland Kitchen  morphine (MS CONTIN) 60 MG 12 hr tablet, Take 60 mg by mouth every 12 (twelve) hours. .  Oxycodone HCl 10 MG TABS, Take 10 mg by mouth every 4 (four) hours.    Current Outpatient Medications (Hematological):  .  folic acid (FOLVITE) 1 MG tablet, Take 1 tablet (1 mg total) by mouth daily. .  vitamin B-12 (CYANOCOBALAMIN) 100 MCG tablet, Take 1 tablet (100 mcg total) by mouth daily.   Current Outpatient Medications (Other):  Marland Kitchen  ARIPiprazole (ABILIFY) 2 MG tablet, TAKE 1 TABLET BY MOUTH  DAILY. .  busPIRone (BUSPAR) 10 MG tablet, Take 1 tablet (10 mg total) by mouth 3 (three) times daily. Marland Kitchen  doxylamine, Sleep, (UNISOM) 25 MG tablet, Take 25 mg by mouth at bedtime as needed for sleep. .  hyoscyamine (ANASPAZ) 0.125 MG TBDP disintergrating tablet,  Place 0.125 mg under the tongue every 6 (six) hours as needed ([name] cramps).  .  pantoprazole (PROTONIX) 40 MG tablet, Take 1 tablet (40 mg total) by mouth daily. .  sertraline (ZOLOFT) 100 MG tablet, TAKE 1 & 1/2 TABLETS ONCE DAILY. Marland Kitchen  triamcinolone cream (KENALOG) 0.1 %, Apply 1 application topically 2 (two) times daily as needed (itching).  .  valACYclovir (VALTREX) 500 MG tablet, Take 500 mg by mouth 2 (two) times daily as needed (flare ups).   Facility-Administered Medications Ordered in Other Visits (Other):  .  sodium chloride flush (NS) 0.9 % injection 3 mL No current facility-administered medications for this visit.     Past medical history, social, surgical and family history all reviewed in electronic medical record.  No pertanent information unless stated regarding to the chief complaint.   Review of Systems:  No headache, visual changes, nausea, vomiting, diarrhea, constipation, dizziness, abdominal pain, skin rash, fevers, chills, night sweats, weight loss, swollen lymph nodes, body aches, joint swelling, muscle aches, chest pain, shortness of breath, mood changes.   Objective  There were no vitals taken for this visit. Systems examined below as of    General: No apparent distress alert and oriented x3 mood and affect normal, dressed appropriately.  HEENT: Pupils equal, extraocular movements intact  Respiratory: Patient's speak in full sentences and does not appear short of breath  Cardiovascular: No lower extremity edema, non tender, no erythema  Skin: Warm dry intact with no signs of infection or rash on extremities or on axial skeleton.  Abdomen: Soft nontender  Neuro: Cranial nerves II through XII are intact,  neurovascularly intact in all extremities with 2+ DTRs and 2+ pulses.  Lymph: No lymphadenopathy of posterior or anterior cervical chain or axillae bilaterally.  Gait normal with good balance and coordination.  MSK:  Non tender with full range of motion and good stability and symmetric strength and tone of shoulders, elbows, wrist, hip, knee and ankles bilaterally.     Impression and Recommendations:     This case required medical decision making of moderate complexity. The above documentation has been reviewed and is accurate and complete Lyndal Pulley, DO       Note: This dictation was prepared with Dragon dictation along with smaller phrase technology. Any transcriptional errors that result from this process are unintentional.

## 2019-09-10 ENCOUNTER — Emergency Department (HOSPITAL_COMMUNITY): Payer: Medicare HMO

## 2019-09-10 ENCOUNTER — Encounter (HOSPITAL_COMMUNITY): Payer: Self-pay | Admitting: *Deleted

## 2019-09-10 ENCOUNTER — Other Ambulatory Visit: Payer: Self-pay

## 2019-09-10 ENCOUNTER — Emergency Department (HOSPITAL_COMMUNITY)
Admission: EM | Admit: 2019-09-10 | Discharge: 2019-09-11 | Disposition: A | Discharge status: Home / Self Care | Payer: Medicare HMO | Attending: Emergency Medicine | Admitting: Emergency Medicine

## 2019-09-10 DIAGNOSIS — Y929 Unspecified place or not applicable: Secondary | ICD-10-CM | POA: Insufficient documentation

## 2019-09-10 DIAGNOSIS — F1010 Alcohol abuse, uncomplicated: Secondary | ICD-10-CM | POA: Insufficient documentation

## 2019-09-10 DIAGNOSIS — W19XXXA Unspecified fall, initial encounter: Secondary | ICD-10-CM | POA: Diagnosis not present

## 2019-09-10 DIAGNOSIS — Y998 Other external cause status: Secondary | ICD-10-CM | POA: Diagnosis not present

## 2019-09-10 DIAGNOSIS — S0003XA Contusion of scalp, initial encounter: Secondary | ICD-10-CM | POA: Diagnosis not present

## 2019-09-10 DIAGNOSIS — R0902 Hypoxemia: Secondary | ICD-10-CM | POA: Diagnosis not present

## 2019-09-10 DIAGNOSIS — S0990XA Unspecified injury of head, initial encounter: Secondary | ICD-10-CM | POA: Diagnosis not present

## 2019-09-10 DIAGNOSIS — R9431 Abnormal electrocardiogram [ECG] [EKG]: Secondary | ICD-10-CM | POA: Diagnosis not present

## 2019-09-10 DIAGNOSIS — R110 Nausea: Secondary | ICD-10-CM | POA: Diagnosis not present

## 2019-09-10 DIAGNOSIS — F17210 Nicotine dependence, cigarettes, uncomplicated: Secondary | ICD-10-CM | POA: Diagnosis not present

## 2019-09-10 DIAGNOSIS — Y9301 Activity, walking, marching and hiking: Secondary | ICD-10-CM | POA: Diagnosis not present

## 2019-09-10 DIAGNOSIS — R112 Nausea with vomiting, unspecified: Secondary | ICD-10-CM | POA: Insufficient documentation

## 2019-09-10 DIAGNOSIS — R52 Pain, unspecified: Secondary | ICD-10-CM | POA: Diagnosis not present

## 2019-09-10 DIAGNOSIS — R51 Headache: Secondary | ICD-10-CM | POA: Diagnosis not present

## 2019-09-10 DIAGNOSIS — Z79899 Other long term (current) drug therapy: Secondary | ICD-10-CM | POA: Diagnosis not present

## 2019-09-10 DIAGNOSIS — Y904 Blood alcohol level of 80-99 mg/100 ml: Secondary | ICD-10-CM | POA: Insufficient documentation

## 2019-09-10 DIAGNOSIS — I10 Essential (primary) hypertension: Secondary | ICD-10-CM | POA: Diagnosis not present

## 2019-09-10 DIAGNOSIS — R69 Illness, unspecified: Secondary | ICD-10-CM | POA: Diagnosis not present

## 2019-09-10 DIAGNOSIS — M542 Cervicalgia: Secondary | ICD-10-CM | POA: Diagnosis not present

## 2019-09-10 LAB — CBC WITH DIFFERENTIAL/PLATELET
Abs Immature Granulocytes: 0.01 10*3/uL (ref 0.00–0.07)
Basophils Absolute: 0 10*3/uL (ref 0.0–0.1)
Basophils Relative: 1 %
Eosinophils Absolute: 0.1 10*3/uL (ref 0.0–0.5)
Eosinophils Relative: 1 %
HCT: 50.7 % — ABNORMAL HIGH (ref 36.0–46.0)
Hemoglobin: 16.7 g/dL — ABNORMAL HIGH (ref 12.0–15.0)
Immature Granulocytes: 0 %
Lymphocytes Relative: 50 %
Lymphs Abs: 3.4 10*3/uL (ref 0.7–4.0)
MCH: 33.4 pg (ref 26.0–34.0)
MCHC: 32.9 g/dL (ref 30.0–36.0)
MCV: 101.4 fL — ABNORMAL HIGH (ref 80.0–100.0)
Monocytes Absolute: 0.6 10*3/uL (ref 0.1–1.0)
Monocytes Relative: 8 %
Neutro Abs: 2.7 10*3/uL (ref 1.7–7.7)
Neutrophils Relative %: 40 %
Platelets: 263 10*3/uL (ref 150–400)
RBC: 5 MIL/uL (ref 3.87–5.11)
RDW: 14.1 % (ref 11.5–15.5)
WBC: 6.8 10*3/uL (ref 4.0–10.5)
nRBC: 0 % (ref 0.0–0.2)

## 2019-09-10 LAB — BASIC METABOLIC PANEL
Anion gap: 14 (ref 5–15)
BUN: 7 mg/dL — ABNORMAL LOW (ref 8–23)
CO2: 25 mmol/L (ref 22–32)
Calcium: 8.9 mg/dL (ref 8.9–10.3)
Chloride: 95 mmol/L — ABNORMAL LOW (ref 98–111)
Creatinine, Ser: 0.85 mg/dL (ref 0.44–1.00)
GFR calc Af Amer: >60 mL/min (ref >60)
GFR calc non Af Amer: >60 mL/min (ref >60)
Glucose, Bld: 90 mg/dL (ref 70–99)
Potassium: 3.9 mmol/L (ref 3.5–5.1)
Sodium: 134 mmol/L — ABNORMAL LOW (ref 135–145)

## 2019-09-10 LAB — PROTIME-INR
INR: 0.9 (ref 0.8–1.2)
Prothrombin Time: 11.7 seconds (ref 11.4–15.2)

## 2019-09-10 LAB — ETHANOL: Alcohol, Ethyl (B): 85 mg/dL — ABNORMAL HIGH (ref <10)

## 2019-09-10 NOTE — Discharge Instructions (Addendum)
Continue your regular medications for comfort at home. Ice to the sore areas to reduce swelling.   Follow up with your doctor for recheck if any soreness persists or worsens. Return to the ED as needed.

## 2019-09-10 NOTE — ED Notes (Signed)
Patient transported to X-ray 

## 2019-09-10 NOTE — ED Provider Notes (Signed)
[facility] EMERGENCY DEPARTMENT Provider Note   CSN: [id number] Arrival date & time: 09/10/19  2204     History   Chief Complaint Chief Complaint  Patient presents with  . Fall    HPI Denise King is a 68 y.o. female.     Patient to ED for evaluation after a fall where she reports she is not sure but thinks she was walking on uneven surface and lost her balance. She fell backward striking the back of her head and upper back. She was alone at the time and unable to get herself up off the ground. She denies LOC. She reports nausea with vomiting. No chest pain or dizziness prior to or after the fall. She had her phone and called for help so does not feel she was on the ground for a long period of time. No other discomfort other than to the back of her head and upper back.   The history is provided by the patient and the EMS personnel.  Fall Associated symptoms include headaches. Pertinent negatives include no chest pain, no abdominal pain and no shortness of breath.    Past Medical History:  Diagnosis Date  . Acute cystitis   . Acute respiratory failure ([place])   . Allergy    as a child grew out of them  . Anemia    pernicious anemia  . Anxiety   . Arthritis   . Back pain   . Blood transfusion   . CAP (community acquired pneumonia)   . Chronic pain syndrome   . Depression   . Diverticulosis of colon   . Dysthymia   . Esophagitis   . EtOH dependence ([place])   . Gastritis   . GERD (gastroesophageal reflux disease)   . H/O chest pain Dec. 2013   no work up done  . Hx of colonic polyps   . Hypertension   . Irritable bowel syndrome   . Osteopenia   . Osteoporosis   . Pneumonia 2019  . Polypharmacy   . Reflux esophagitis   . Right sided sciatica 12/22/2017  . Tobacco use disorder   . Unspecified chronic bronchitis ([place])   . Vertigo   . Vitamin B12 deficiency   . Wears glasses     Patient Active Problem List   Diagnosis Date Noted  .  Right knee pain 08/09/2019  . Quadriceps contusion 08/09/2019  . Abnormal cardiac CT angiography 05/11/2019  . Chest pain 04/11/2019  . Acute viral syndrome 04/11/2019  . Tobacco abuse 04/11/2019  . Hyperphosphatemia 04/11/2019  . SOB (shortness of breath) 04/07/2019  . Hip fracture ([place]) 10/01/2018  . Dysuria 05/20/2018  . Right leg swelling 05/20/2018  . CAP (community acquired pneumonia) 05/13/2018  . Tachycardia 05/13/2018  . Chest wall pain 05/13/2018  . Narcotic poisoning ([place]) 05/13/2018  . EtOH dependence ([place])   . Polypharmacy   . Esophagitis   . Preventative health care 12/22/2017  . Erythrocytosis 12/22/2017  . Depression 12/22/2017  . Hyperglycemia 12/22/2017  . Right sided sciatica 12/22/2017  . Nausea & vomiting 07/17/2017  . Cough 06/10/2017  . Diarrhea 04/22/2016  . Family history of colon cancer - brother and father 03/10/2016  . Insomnia 02/11/2016  . Generalized anxiety disorder 11/25/2015  . Urinary frequency 04/25/2015  . Nausea and vomiting in adult 11/08/2014  . Cigarette smoker 08/15/2013  . Otitis media 10/27/2012  . Abdominal pain, generalized 03/30/2012  . HEPATIC CYST 02/18/2010  . Chronic pain syndrome 02/08/2010  .  Vitamin B12 deficiency 10/04/2009  . GERD 01/04/2009  . ABDOMINAL PAIN-EPIGASTRIC 01/04/2009  . DYSTHYMIA 08/10/2008  . Venous (peripheral) insufficiency 08/10/2008  . Irritable bowel syndrome 08/10/2008  . Back pain 08/10/2008  . CIGARETTE SMOKER 03/06/2008  . Hx of adenomatous polyp of colon 12/06/2007  . BRONCHITIS, RECURRENT 12/06/2007  . DIVERTICULOSIS OF COLON 12/06/2007  . OSTEOPENIA 12/06/2007  . CALCULUS, KIDNEY 10/07/2007  . VERTIGO 10/07/2007  . Headache(784.0) 10/07/2007    Past Surgical History:  Procedure Laterality Date  . APPENDECTOMY    . BACK SURGERY  10-09   Dr. Patrice Paradise  . CARPAL TUNNEL RELEASE Right 08/14/2014   Procedure: RIGHT CARPAL TUNNEL RELEASE AND INJECT LEFT THUMB;  Surgeon: Daryll Brod, MD;   Location: [place];  Service: Orthopedics;  Laterality: Right;  . COLONOSCOPY    . INTRAMEDULLARY (IM) NAIL INTERTROCHANTERIC Right 10/01/2018   Procedure: INTRAMEDULLARY (IM) NAIL INTERTROCHANTRIC;  Surgeon: Thornton Park, MD;  Location: [facility];  Service: Orthopedics;  Laterality: Right;  . LAPAROSCOPY N/A 02/21/2013   Procedure: LAPAROSCOPY OPERATIVE;  Surgeon: Margarette Asal, MD;  Location: [place] [facility];  Service: Gynecology;  Laterality: N/A;  REQUESTING 5MM SCOPE WITH CAMERA  . LEFT HEART CATH AND CORONARY ANGIOGRAPHY N/A 05/11/2019   Procedure: LEFT HEART CATH AND CORONARY ANGIOGRAPHY;  Surgeon: Sherren Mocha, MD;  Location: [place] [facility];  Service: Cardiovascular;  Laterality: N/A;  . TONSILLECTOMY    . TUBAL LIGATION    . UPPER GASTROINTESTINAL ENDOSCOPY       OB History   No obstetric history on file.      Home Medications    Prior to Admission medications   Medication Sig Start Date End Date Taking? Authorizing Provider  busPIRone (BUSPAR) 10 MG tablet Take 1 tablet (10 mg total) by mouth 3 (three) times daily. 07/24/19  Yes Biagio Borg, MD  Cholecalciferol (VITAMIN D) 50 MCG (2000 UT) tablet Take 2,000 Units by mouth daily after lunch.   Yes [provider]  cholestyramine (QUESTRAN) 4 GM/DOSE powder Take 1 packet (4 g total) by mouth 2 (two) times daily with a meal. Patient taking differently: Take 4 g by mouth 2 (two) times daily as needed ([name] (take with meals)).  03/24/19  Yes Gatha Mayer, MD  denosumab (PROLIA) 60 MG/ML SOSY injection Inject 60 mg into the skin every 6 (six) months.   Yes [provider]  diphenhydramine-acetaminophen (TYLENOL PM) 25-500 MG TABS tablet Take 2 tablets by mouth at bedtime as needed (sleep).   Yes [provider]  doxylamine, Sleep, (UNISOM) 25 MG tablet Take 25 mg by mouth at bedtime.    Yes [provider]  folic acid (FOLVITE) 1 MG tablet Take 1 tablet (1 mg total) by mouth  daily. Patient taking differently: Take 1 mg by mouth daily after lunch.  09/05/18  Yes Biagio Borg, MD  hyoscyamine (ANASPAZ) 0.125 MG TBDP disintergrating tablet Place 0.125 mg under the tongue every 6 (six) hours as needed ([name] cramps).    Yes [provider]  morphine (MS CONTIN) 60 MG 12 hr tablet Take 60 mg by mouth every 12 (twelve) hours. 05/02/19  Yes [provider]  Oxycodone HCl 10 MG TABS Take 10 mg by mouth 3 (three) times daily.  03/06/19  Yes [provider]  pantoprazole (PROTONIX) 40 MG tablet Take 1 tablet (40 mg total) by mouth daily. Patient taking differently: Take 40 mg by mouth at bedtime.  04/12/19  Yes Sheikh,  Bertram Savin, DO  rosuvastatin (CRESTOR) 5 MG tablet Take 1 tablet (5 mg total) by mouth every Monday, Wednesday, and Friday at 6 PM. 08/30/19 11/28/19 Yes Josue Hector, MD  sertraline (ZOLOFT) 100 MG tablet TAKE 1 & 1/2 TABLETS ONCE DAILY. Patient taking differently: Take 150 mg by mouth at bedtime.  08/29/19  Yes Pucilowski, Olgierd A, MD  triamcinolone cream (KENALOG) 0.1 % Apply 1 application topically 2 (two) times daily as needed (itching).  03/14/19  Yes [provider]  valACYclovir (VALTREX) 500 MG tablet Take 500 mg by mouth 2 (two) times daily as needed (flare ups).   Yes [provider]  vitamin B-12 (CYANOCOBALAMIN) 100 MCG tablet Take 1 tablet (100 mcg total) by mouth daily. Patient taking differently: Take 100 mcg by mouth daily after lunch.  11/03/18  Yes Biagio Borg, MD  aspirin 81 MG chewable tablet Chew 1 tablet (81 mg total) by mouth daily. 04/12/19   Kerney Elbe, DO    Family History Family History  Problem Relation Age of Onset  . Colon cancer Father   . Prostate cancer Father   . Heart disease Mother        prev MVR, also has DJD  . Colon cancer Brother   . Skin cancer Sister   . Alcohol abuse Daughter   . Bipolar disorder Daughter   . Drug abuse Daughter   . Esophageal cancer Neg Hx   .  Rectal cancer Neg Hx   . Stomach cancer Neg Hx     Social History Social History   Tobacco Use  . Smoking status: Current Every Day Smoker    Packs/day: 1.00    Years: 30.00    Pack years: 30.00    Types: Cigarettes  . Smokeless tobacco: Never Used  Substance Use Topics  . Alcohol use: Yes    Alcohol/week: 3.0 standard drinks    Types: 3 Standard drinks or equivalent per week    Comment: occasional  . Drug use: No     Allergies   Atorvastatin, Levofloxacin, Omnicef [cefdinir], Trazodone and nefazodone, Sulfa antibiotics, and Sulfonamide derivatives   Review of Systems Review of Systems  Constitutional: Negative for diaphoresis.  Eyes: Negative for visual disturbance.  Respiratory: Negative.  Negative for cough and shortness of breath.   Cardiovascular: Negative.  Negative for chest pain.  Gastrointestinal: Positive for nausea. Negative for abdominal pain and vomiting.  Musculoskeletal: Negative.   Skin: Negative.   Neurological: Positive for headaches. Negative for syncope.     Physical Exam Updated Vital Signs BP 139/75   Pulse 76   Temp 98.4 F (36.9 C) (Oral)   Resp 14   Ht 5\' 3"  (1.6 m)   Wt 51.3 kg   SpO2 91%   BMI 20.02 kg/m   Physical Exam Constitutional:      General: She is not in acute distress.    Appearance: She is well-developed.  HENT:     Head: Normocephalic.     Comments: Hematoma to occipital scalp without wound or bleeding.    Nose: Nose normal.  Eyes:     Pupils: Pupils are equal, round, and reactive to light.  Neck:     Musculoskeletal: Normal range of motion and neck supple.  Cardiovascular:     Rate and Rhythm: Normal rate and regular rhythm.     Heart sounds: No murmur.  Pulmonary:     Effort: Pulmonary effort is normal.     Breath sounds: Normal breath  sounds. No wheezing, rhonchi or rales.  Chest:     Chest wall: No tenderness.  Abdominal:     Palpations: Abdomen is soft.     Tenderness: There is no abdominal  tenderness. There is no guarding or rebound.  Musculoskeletal: Normal range of motion.     Comments: No midline or paraspinal tenderness to cervical, thoracic or lumbar spine.   Skin:    General: Skin is warm and dry.     Findings: No rash.  Neurological:     Mental Status: She is alert.     Comments: CN's 3-12 grossly intact. Speech is slightly slurred but oriented, focused. No facial asymmetry. No lateralizing weakness. Reflexes are equal. No deficits of coordination. Gait deferred until imaging complete        ED Treatments / Results  Labs (all labs ordered are listed, but only abnormal results are displayed) Labs Reviewed  CBC WITH DIFFERENTIAL/PLATELET - Abnormal; Notable for the following components:      Result Value   Hemoglobin 16.7 (*)    HCT 50.7 (*)    MCV 101.4 (*)    All other components within normal limits  BASIC METABOLIC PANEL - Abnormal; Notable for the following components:   Sodium 134 (*)    Chloride 95 (*)    BUN 7 (*)    All other components within normal limits  ETHANOL - Abnormal; Notable for the following components:   Alcohol, Ethyl (B) 85 (*)    All other components within normal limits  PROTIME-INR    EKG None EKG: normal EKG, normal sinus rhythm,  Radiology Ct Head Wo Contrast  Result Date: 09/10/2019 CLINICAL DATA:  Recent slip and fall with headaches and neck pain, initial encounter EXAM: CT HEAD WITHOUT CONTRAST CT CERVICAL SPINE WITHOUT CONTRAST TECHNIQUE: Multidetector CT imaging of the head and cervical spine was performed following the standard protocol without intravenous contrast. Multiplanar CT image reconstructions of the cervical spine were also generated. COMPARISON:  10/29/2017 FINDINGS: CT HEAD FINDINGS Brain: Mild atrophic and chronic white matter ischemic changes are noted stable from the prior exam. No findings to suggest acute hemorrhage, acute infarction or space-occupying mass lesion are noted. Vascular: No hyperdense  vessel or unexpected calcification. Skull: Normal. Negative for fracture or focal lesion. Sinuses/Orbits: No acute finding. Other: None. CT CERVICAL SPINE FINDINGS Alignment: Within normal limits. Skull base and vertebrae: 7 cervical segments are well visualized. Vertebral body height is well maintained. Disc space narrowing is noted at C5-6 with mild vacuum disc phenomenon osteophytic changes. Multilevel facet hypertrophic changes are noted left slightly greater than right. No acute fracture or acute facet abnormality is noted. Soft tissues and spinal canal: The surrounding soft tissue structures demonstrate some heterogeneity of the thyroid gland without definitive lesion. Carotid calcifications are noted. Upper chest: Visualized lung apices are within normal limits. Other: None IMPRESSION: CT of the head: Chronic atrophic and ischemic changes without acute abnormality. CT of the cervical spine: Degenerative changes are seen without acute abnormality. Electronically Signed   By: Inez Catalina M.D.   On: 09/10/2019 23:20   Ct Cervical Spine Wo Contrast  Result Date: 09/10/2019 CLINICAL DATA:  Recent slip and fall with headaches and neck pain, initial encounter EXAM: CT HEAD WITHOUT CONTRAST CT CERVICAL SPINE WITHOUT CONTRAST TECHNIQUE: Multidetector CT imaging of the head and cervical spine was performed following the standard protocol without intravenous contrast. Multiplanar CT image reconstructions of the cervical spine were also generated. COMPARISON:  10/29/2017 FINDINGS: CT HEAD  FINDINGS Brain: Mild atrophic and chronic white matter ischemic changes are noted stable from the prior exam. No findings to suggest acute hemorrhage, acute infarction or space-occupying mass lesion are noted. Vascular: No hyperdense vessel or unexpected calcification. Skull: Normal. Negative for fracture or focal lesion. Sinuses/Orbits: No acute finding. Other: None. CT CERVICAL SPINE FINDINGS Alignment: Within normal limits.  Skull base and vertebrae: 7 cervical segments are well visualized. Vertebral body height is well maintained. Disc space narrowing is noted at C5-6 with mild vacuum disc phenomenon osteophytic changes. Multilevel facet hypertrophic changes are noted left slightly greater than right. No acute fracture or acute facet abnormality is noted. Soft tissues and spinal canal: The surrounding soft tissue structures demonstrate some heterogeneity of the thyroid gland without definitive lesion. Carotid calcifications are noted. Upper chest: Visualized lung apices are within normal limits. Other: None IMPRESSION: CT of the head: Chronic atrophic and ischemic changes without acute abnormality. CT of the cervical spine: Degenerative changes are seen without acute abnormality. Electronically Signed   By: Inez Catalina M.D.   On: 09/10/2019 23:20    Procedures Procedures (including critical care time)  Medications Ordered in ED Medications - No data to display   Initial Impression / Assessment and Plan / ED Course  I have reviewed the triage vital signs and the nursing notes.  Pertinent labs & imaging results that were available during my care of the patient were reviewed by me and considered in my medical decision making (see chart for details).        Patient to ED after unwitnessed fall after, per patient, losing balance on uneven surface. No LOC. C/O posterior head pain.  The patient has some slurring of words. She admits to drinking some wine tonight and is noted to be on high dose opiates chronically. Husband now at bedside who reports patient is at her baseline.   CT head and neck negative. Collar removed. Patient is not on blood thinners, PT/INR normal. Labs otherwise unremarkable. ETOH 85.   The patient is ambulated to the bathroom and is steady, requiring minimal assist. Independent back to room/bed.   She is felt appropriate for discharge home with husband. She has adequate pain relief at home and  requires no additional medications. Return precautions discussed.   Final Clinical Impressions(s) / ED Diagnoses   Final diagnoses:  Fall, initial encounter  Minor head injury, initial encounter   1. Fall 2. Minor head injury 3. Scalp contusion  ED Discharge Orders    None       Charlann Lange, Hershal Coria 09/11/19 Marrion Coy, MD 09/12/19 [phone]

## 2019-09-10 NOTE — ED Triage Notes (Signed)
Pt arrives via [facility] from home. She slipped on rocks and fell, striking the back of her head on the rocks. No LOC, no blood thinners. C collar was placed en route. VSS.

## 2019-09-11 ENCOUNTER — Ambulatory Visit (INDEPENDENT_AMBULATORY_CARE_PROVIDER_SITE_OTHER): Payer: Medicare HMO | Admitting: Internal Medicine

## 2019-09-11 ENCOUNTER — Encounter: Payer: Self-pay | Admitting: Internal Medicine

## 2019-09-11 ENCOUNTER — Other Ambulatory Visit: Payer: Self-pay

## 2019-09-11 VITALS — BP 116/76 | HR 90 | Temp 98.2°F | Ht 63.0 in | Wt 114.0 lb

## 2019-09-11 DIAGNOSIS — D751 Secondary polycythemia: Secondary | ICD-10-CM | POA: Diagnosis not present

## 2019-09-11 DIAGNOSIS — R739 Hyperglycemia, unspecified: Secondary | ICD-10-CM | POA: Diagnosis not present

## 2019-09-11 DIAGNOSIS — Z0000 Encounter for general adult medical examination without abnormal findings: Secondary | ICD-10-CM

## 2019-09-11 DIAGNOSIS — Z23 Encounter for immunization: Secondary | ICD-10-CM

## 2019-09-11 NOTE — Patient Instructions (Addendum)
You had the flu shot today  .Please continue all other medications as before, and refills have been done if requested.  Please have the pharmacy call with any other refills you may need.  Please continue your efforts at being more active, low cholesterol diet, and weight control.  You are otherwise up to date with prevention measures today.  Please keep your appointments with your specialists as you may have planned  Please go to the LAB in the Basement (turn left off the elevator) for the tests to be done at your convenience  You will be contacted by phone if any changes need to be made immediately.  Otherwise, you will receive a letter about your results with an explanation, but please check with MyChart first.  Please remember to sign up for MyChart if you have not done so, as this will be important to you in the future with finding out test results, communicating by private email, and scheduling acute appointments online when needed.  Please return in 6 months, or sooner if needed

## 2019-09-11 NOTE — Progress Notes (Signed)
Subjective:    Patient ID: Denise King, female    DOB: 11-16-1951, 68 y.o.   MRN: [id number]  HPI   Here for wellness and f/u;  Overall doing ok;  Pt denies Chest pain, worsening SOB, DOE, wheezing, orthopnea, PND, worsening LE edema, palpitations, dizziness or syncope.  Pt denies neurological change such as new headache, facial or extremity weakness.  Pt denies polydipsia, polyuria, or low sugar symptoms. Pt states overall good compliance with treatment and medications, good tolerability, and has been trying to follow appropriate diet.  Pt denies worsening depressive symptoms, suicidal ideation or panic. No fever, night sweats, wt loss, loss of appetite, or other constitutional symptoms.  Pt states good ability with ADL's, has low to mod fall risk, home safety reviewed and adequate, no other significant changes in hearing or vision, and only occasionally active with exercise. Seen in ED yesterday after fall with head to a rock, CT head neg in ED.  Has worsening ? Scoliosis with rightward leaning and off balance, d/w pain management but has also already seen per Dr Smith/sport med for right knee and leg as well.  Pt does not want f/u for now.  Due for flu shot  Had elevate ETOH level twice recently including yesterday, but denies ETOH problem Past Medical History:  Diagnosis Date   Acute cystitis    Acute respiratory failure ([place])    Allergy    as a child grew out of them   Anemia    pernicious anemia   Anxiety    Arthritis    Back pain    Blood transfusion    CAP (community acquired pneumonia)    Chronic pain syndrome    Depression    Diverticulosis of colon    Dysthymia    Esophagitis    EtOH dependence ([place])    Gastritis    GERD (gastroesophageal reflux disease)    H/O chest pain Dec. 2013   no work up done   Hx of colonic polyps    Hypertension    Irritable bowel syndrome    Osteopenia    Osteoporosis    Pneumonia 2019   Polypharmacy     Reflux esophagitis    Right sided sciatica 12/22/2017   Tobacco use disorder    Unspecified chronic bronchitis (HCC)    Vertigo    Vitamin B12 deficiency    Wears glasses    Past Surgical History:  Procedure Laterality Date   APPENDECTOMY     BACK SURGERY  10-09   Dr. Patrice Paradise   CARPAL TUNNEL RELEASE Right 08/14/2014   Procedure: RIGHT CARPAL TUNNEL RELEASE AND INJECT LEFT THUMB;  Surgeon: Daryll Brod, MD;  Location: [place];  Service: Orthopedics;  Laterality: Right;   COLONOSCOPY     INTRAMEDULLARY (IM) NAIL INTERTROCHANTERIC Right 10/01/2018   Procedure: INTRAMEDULLARY (IM) NAIL INTERTROCHANTRIC;  Surgeon: Thornton Park, MD;  Location: [facility];  Service: Orthopedics;  Laterality: Right;   LAPAROSCOPY N/A 02/21/2013   Procedure: LAPAROSCOPY OPERATIVE;  Surgeon: Margarette Asal, MD;  Location: [facility] [facility];  Service: Gynecology;  Laterality: N/A;  REQUESTING 5MM SCOPE WITH CAMERA   LEFT HEART CATH AND CORONARY ANGIOGRAPHY N/A 05/11/2019   Procedure: LEFT HEART CATH AND CORONARY ANGIOGRAPHY;  Surgeon: Sherren Mocha, MD;  Location: [place] [facility];  Service: Cardiovascular;  Laterality: N/A;   TONSILLECTOMY     TUBAL LIGATION     UPPER GASTROINTESTINAL ENDOSCOPY      reports that she has been  smoking cigarettes. She has a 30.00 pack-year smoking history. She has never used smokeless tobacco. She reports current alcohol use of about 3.0 standard drinks of alcohol per week. She reports that she does not use drugs. family history includes Alcohol abuse in her daughter; Bipolar disorder in her daughter; Colon cancer in her brother and father; Drug abuse in her daughter; Heart disease in her mother; Prostate cancer in her father; Skin cancer in her sister. Allergies  Allergen Reactions   Atorvastatin Nausea And Vomiting   Levofloxacin Other (See Comments)    Reaction: achilles tendon pain   Omnicef [Cefdinir] Diarrhea    Uncontrollable diarrhea    Trazodone And Nefazodone Other (See Comments)    "Felt like I was going to faint"   Sulfa Antibiotics Rash   Sulfonamide Derivatives Rash   Current Outpatient Medications on File Prior to Visit  Medication Sig Dispense Refill   aspirin 81 MG chewable tablet Chew 1 tablet (81 mg total) by mouth daily. 30 tablet 0   busPIRone (BUSPAR) 10 MG tablet Take 1 tablet (10 mg total) by mouth 3 (three) times daily. 90 tablet 5   Cholecalciferol (VITAMIN D) 50 MCG (2000 UT) tablet Take 2,000 Units by mouth daily after lunch.     cholestyramine (QUESTRAN) 4 GM/DOSE powder Take 1 packet (4 g total) by mouth 2 (two) times daily with a meal. (Patient taking differently: Take 4 g by mouth 2 (two) times daily as needed ([name] (take with meals)). ) 378 g 12   denosumab (PROLIA) 60 MG/ML SOSY injection Inject 60 mg into the skin every 6 (six) months.     diphenhydramine-acetaminophen (TYLENOL PM) 25-500 MG TABS tablet Take 2 tablets by mouth at bedtime as needed (sleep).     doxylamine, Sleep, (UNISOM) 25 MG tablet Take 25 mg by mouth at bedtime.      folic acid (FOLVITE) 1 MG tablet Take 1 tablet (1 mg total) by mouth daily. (Patient taking differently: Take 1 mg by mouth daily after lunch. ) 90 tablet 3   hyoscyamine (ANASPAZ) 0.125 MG TBDP disintergrating tablet Place 0.125 mg under the tongue every 6 (six) hours as needed ([name] cramps).      morphine (MS CONTIN) 60 MG 12 hr tablet Take 60 mg by mouth every 12 (twelve) hours.     Oxycodone HCl 10 MG TABS Take 10 mg by mouth 3 (three) times daily.      pantoprazole (PROTONIX) 40 MG tablet Take 1 tablet (40 mg total) by mouth daily. (Patient taking differently: Take 40 mg by mouth at bedtime. ) 30 tablet 0   rosuvastatin (CRESTOR) 5 MG tablet Take 1 tablet (5 mg total) by mouth every Monday, Wednesday, and Friday at 6 PM. 36 tablet 3   sertraline (ZOLOFT) 100 MG tablet TAKE 1 & 1/2 TABLETS ONCE DAILY. (Patient taking differently: Take 150 mg by mouth  at bedtime. ) 135 tablet 0   triamcinolone cream (KENALOG) 0.1 % Apply 1 application topically 2 (two) times daily as needed (itching).      valACYclovir (VALTREX) 500 MG tablet Take 500 mg by mouth 2 (two) times daily as needed (flare ups).     vitamin B-12 (CYANOCOBALAMIN) 100 MCG tablet Take 1 tablet (100 mcg total) by mouth daily. (Patient taking differently: Take 100 mcg by mouth daily after lunch. ) 90 tablet 3   Current Facility-Administered Medications on File Prior to Visit  Medication Dose Route Frequency Provider Last Rate Last Dose   sodium chloride  flush (NS) 0.9 % injection 3 mL  3 mL Intravenous Q12H Josue Hector, MD       Review of Systems Constitutional: Negative for other unusual diaphoresis, sweats, appetite or weight changes HENT: Negative for other worsening hearing loss, ear pain, facial swelling, mouth sores or neck stiffness.   Eyes: Negative for other worsening pain, redness or other visual disturbance.  Respiratory: Negative for other stridor or swelling Cardiovascular: Negative for other palpitations or other chest pain  Gastrointestinal: Negative for worsening diarrhea or loose stools, blood in stool, distention or other pain Genitourinary: Negative for hematuria, flank pain or other change in urine volume.  Musculoskeletal: Negative for myalgias or other joint swelling.  Skin: Negative for other color change, or other wound or worsening drainage.  Neurological: Negative for other syncope or numbness. Hematological: Negative for other adenopathy or swelling Psychiatric/Behavioral: Negative for hallucinations, other worsening agitation, SI, self-injury, or new decreased concentration All otherwise neg per pt    Objective:   Physical Exam BP 116/76    Pulse 90    Temp 98.2 F (36.8 C) (Oral)    Ht 5\' 3"  (1.6 m)    Wt 114 lb (51.7 kg)    SpO2 93%    BMI 20.19 kg/m  VS noted,  Constitutional: Pt is oriented to person, place, and time. Appears  well-developed and well-nourished, in no significant distress and comfortable Head: Normocephalic and atraumatic  Eyes: Conjunctivae and EOM are normal. Pupils are equal, round, and reactive to light Right Ear: External ear normal without discharge Left Ear: External ear normal without discharge Nose: Nose without discharge or deformity Mouth/Throat: Oropharynx is without other ulcerations and moist  Neck: Normal range of motion. Neck supple. No JVD present. No tracheal deviation present or significant neck LA or mass Cardiovascular: Normal rate, regular rhythm, normal heart sounds and intact distal pulses.   Pulmonary/Chest: WOB normal and breath sounds without rales or wheezing  Abdominal: Soft. Bowel sounds are normal. NT. No HSM  Musculoskeletal: Normal range of motion. Exhibits no edema Lymphadenopathy: Has no other cervical adenopathy.  Neurological: Pt is alert and oriented to person, place, and time. Pt has normal reflexes. No cranial nerve deficit. Motor grossly intact, Gait intact Skin: Skin is warm and dry. No rash noted or new ulcerations Psychiatric:  Has normal mood and affect. Behavior is normal without agitation All otherwise neg per pt Lab Results  Component Value Date   WBC 6.8 09/10/2019   HGB 16.7 (H) 09/10/2019   HCT 50.7 (H) 09/10/2019   PLT 263 09/10/2019   GLUCOSE 90 09/10/2019   CHOL 128 04/09/2019   TRIG 52 04/09/2019   HDL 47 04/09/2019   LDLDIRECT 102.4 05/24/2008   LDLCALC 71 04/09/2019   ALT 7 04/19/2019   AST 14 04/19/2019   NA 134 (L) 09/10/2019   K 3.9 09/10/2019   CL 95 (L) 09/10/2019   CREATININE 0.85 09/10/2019   BUN 7 (L) 09/10/2019   CO2 25 09/10/2019   TSH 0.499 04/08/2019   INR 0.9 09/10/2019   HGBA1C 5.2 10/01/2018      Assessment & Plan:

## 2019-09-11 NOTE — Assessment & Plan Note (Signed)
conts to see heme in [place], may need phlebotomy per pt

## 2019-09-14 ENCOUNTER — Ambulatory Visit (INDEPENDENT_AMBULATORY_CARE_PROVIDER_SITE_OTHER): Payer: Medicare HMO | Admitting: Psychiatry

## 2019-09-14 ENCOUNTER — Other Ambulatory Visit: Payer: Self-pay

## 2019-09-14 ENCOUNTER — Encounter: Payer: Self-pay | Admitting: Internal Medicine

## 2019-09-14 DIAGNOSIS — F341 Dysthymic disorder: Secondary | ICD-10-CM

## 2019-09-14 DIAGNOSIS — F411 Generalized anxiety disorder: Secondary | ICD-10-CM | POA: Diagnosis not present

## 2019-09-14 DIAGNOSIS — R69 Illness, unspecified: Secondary | ICD-10-CM | POA: Diagnosis not present

## 2019-09-14 MED ORDER — HYDROXYZINE PAMOATE 25 MG PO CAPS: 25.0000 mg | ORAL_CAPSULE | Freq: Three times a day (TID) | ORAL | 1 refills | Status: DC | PRN

## 2019-09-14 MED ORDER — SERTRALINE HCL 100 MG PO TABS: 200.0000 mg | ORAL_TABLET | Freq: Every day | ORAL | 1 refills | Status: DC

## 2019-09-14 NOTE — Assessment & Plan Note (Signed)

## 2019-09-14 NOTE — Assessment & Plan Note (Signed)
stable overall by history and exam, recent data reviewed with pt, and pt to continue medical treatment as before,  to f/u any worsening symptoms or concerns  

## 2019-09-14 NOTE — Progress Notes (Signed)
BH MD/PA/NP OP Progress Note  09/14/2019 4:28 PM Denise King  MRN:  [id number] Interview was conducted by phone and I verified that I was speaking with the correct person using two identifiers. I discussed the limitations of evaluation and management by telemedicine and  the availability of in person appointments. Patient expressed understanding and agreed to proceed.  Chief Complaint: Anxiety.  HPI: 68 yo female with chronic anxiety/depression which worsened with increased physical problems/pain. She has not follow up in a month after last visit with me and it has been 6 months since her last visit. She has been for a long time on Cymbalta - we have cross tapered it to sertraline which she tolerated well and dose has been increased to 150 mg as she continued to reports feeling anxious and depressed.  She admits to still being depressed and having low energy. She is on narcotic analgesics and gabapentin for chronic pain. She was taken off clonazepam out of concern about combining benzodiazepines with narcotic analgesics and instead buspirone was added. She finds it completely ineffective for anxiety. She takes Unisom for insomnia instead of previously prescribed  1 mg of clonazepam. She and her husband are very concerned about their daughter Zaelee Biddix) mental health - she has been diagnosed with bipolar disorder and was hospitalized but is resistant to accepting she has a disorder, is noncompliant with meds and has charges against her for aggressive behavior. They will try to persuade her to call and schedule a visit with one of our psychiatrists.   Visit Diagnosis:    ICD-10-CM   1. DYSTHYMIA  F34.1   2. Generalized anxiety disorder  F41.1     Past Psychiatric History: Please see intake H&P.  Past Medical History:  Past Medical History:  Diagnosis Date  . Acute cystitis   . Acute respiratory failure ([place])   . Allergy    as a child grew out of them  . Anemia    pernicious  anemia  . Anxiety   . Arthritis   . Back pain   . Blood transfusion   . CAP (community acquired pneumonia)   . Chronic pain syndrome   . Depression   . Diverticulosis of colon   . Dysthymia   . Esophagitis   . EtOH dependence ([place])   . Gastritis   . GERD (gastroesophageal reflux disease)   . H/O chest pain Dec. 2013   no work up done  . Hx of colonic polyps   . Hypertension   . Irritable bowel syndrome   . Osteopenia   . Osteoporosis   . Pneumonia 2019  . Polypharmacy   . Reflux esophagitis   . Right sided sciatica 12/22/2017  . Tobacco use disorder   . Unspecified chronic bronchitis ([place])   . Vertigo   . Vitamin B12 deficiency   . Wears glasses     Past Surgical History:  Procedure Laterality Date  . APPENDECTOMY    . BACK SURGERY  10-09   Dr. Patrice Paradise  . CARPAL TUNNEL RELEASE Right 08/14/2014   Procedure: RIGHT CARPAL TUNNEL RELEASE AND INJECT LEFT THUMB;  Surgeon: Daryll Brod, MD;  Location: [place];  Service: Orthopedics;  Laterality: Right;  . COLONOSCOPY    . INTRAMEDULLARY (IM) NAIL INTERTROCHANTERIC Right 10/01/2018   Procedure: INTRAMEDULLARY (IM) NAIL INTERTROCHANTRIC;  Surgeon: Thornton Park, MD;  Location: [facility];  Service: Orthopedics;  Laterality: Right;  . LAPAROSCOPY N/A 02/21/2013   Procedure: LAPAROSCOPY OPERATIVE;  Surgeon: Delfino Lovett  Garry Heater, MD;  Location: [place] [facility];  Service: Gynecology;  Laterality: N/A;  REQUESTING 5MM SCOPE WITH CAMERA  . LEFT HEART CATH AND CORONARY ANGIOGRAPHY N/A 05/11/2019   Procedure: LEFT HEART CATH AND CORONARY ANGIOGRAPHY;  Surgeon: Sherren Mocha, MD;  Location: [place] [facility];  Service: Cardiovascular;  Laterality: N/A;  . TONSILLECTOMY    . TUBAL LIGATION    . UPPER GASTROINTESTINAL ENDOSCOPY      Family Psychiatric History: Reviewed.  Family History:  Family History  Problem Relation Age of Onset  . Colon cancer Father   . Prostate cancer Father   . Heart disease Mother        prev MVR,  also has DJD  . Colon cancer Brother   . Skin cancer Sister   . Alcohol abuse Daughter   . Bipolar disorder Daughter   . Drug abuse Daughter   . Esophageal cancer Neg Hx   . Rectal cancer Neg Hx   . Stomach cancer Neg Hx     Social History:  Social History   Socioeconomic History  . Marital status: Married    Spouse name: Not on file  . Number of children: 4  . Years of education: 38  . Highest education level: Not on file  Occupational History  . Occupation: retired    Comment: disablility  Social Needs  . Financial resource strain: Not very hard  . Food insecurity    Worry: Never true    Inability: Never true  . Transportation needs    Medical: No    Non-medical: No  Tobacco Use  . Smoking status: Current Every Day Smoker    Packs/day: 1.00    Years: 30.00    Pack years: 30.00    Types: Cigarettes  . Smokeless tobacco: Never Used  Substance and Sexual Activity  . Alcohol use: Yes    Alcohol/week: 3.0 standard drinks    Types: 3 Standard drinks or equivalent per week    Comment: occasional  . Drug use: No  . Sexual activity: Yes  Lifestyle  . Physical activity    Days per week: 0 days    Minutes per session: 0 min  . Stress: Rather much  Relationships  . Social connections    Talks on phone: More than three times a week    Gets together: Once a week    Attends religious service: Never    Active member of club or organization: No    Attends meetings of clubs or organizations: Never    Relationship status: Married  Other Topics Concern  . Not on file  Social History Narrative   Married x 38 yrs. Has BS degree in Horticulture from [employer]. Currently resides in a house with her husband. 3 cats.  Fun: used to like to do a lot of things.    Denies religious beliefs that would effect health care.    lives with husband [name]; quit smoking- October 6th, 2019. ocasional alcohol. [employer] [2009] on disability sec to back issues.     Allergies:   Allergies  Allergen Reactions  . Atorvastatin Nausea And Vomiting  . Levofloxacin Other (See Comments)    Reaction: achilles tendon pain  . Omnicef [Cefdinir] Diarrhea    Uncontrollable diarrhea  . Trazodone And Nefazodone Other (See Comments)    "Felt like I was going to faint"  . Sulfa Antibiotics Rash  . Sulfonamide Derivatives Rash    Metabolic Disorder Labs: Lab Results  Component Value Date  HGBA1C 5.2 10/01/2018   MPG 103 10/01/2018   No results found for: PROLACTIN Lab Results  Component Value Date   CHOL 128 04/09/2019   TRIG 52 04/09/2019   HDL 47 04/09/2019   CHOLHDL 2.7 04/09/2019   VLDL 10 04/09/2019   LDLCALC 71 04/09/2019   LDLCALC 78 12/22/2017   Lab Results  Component Value Date   TSH 0.499 04/08/2019   TSH 1.108 10/01/2018    Therapeutic Level Labs: No results found for: LITHIUM No results found for: VALPROATE No components found for:  CBMZ  Current Medications: Current Outpatient Medications  Medication Sig Dispense Refill  . aspirin 81 MG chewable tablet Chew 1 tablet (81 mg total) by mouth daily. 30 tablet 0  . busPIRone (BUSPAR) 10 MG tablet Take 1 tablet (10 mg total) by mouth 3 (three) times daily. 90 tablet 5  . Cholecalciferol (VITAMIN D) 50 MCG (2000 UT) tablet Take 2,000 Units by mouth daily after lunch.    . cholestyramine (QUESTRAN) 4 GM/DOSE powder Take 1 packet (4 g total) by mouth 2 (two) times daily with a meal. (Patient taking differently: Take 4 g by mouth 2 (two) times daily as needed ([name] (take with meals)). ) 378 g 12  . denosumab (PROLIA) 60 MG/ML SOSY injection Inject 60 mg into the skin every 6 (six) months.    . diphenhydramine-acetaminophen (TYLENOL PM) 25-500 MG TABS tablet Take 2 tablets by mouth at bedtime as needed (sleep).    Marland Kitchen doxylamine, Sleep, (UNISOM) 25 MG tablet Take 25 mg by mouth at bedtime.     . folic acid (FOLVITE) 1 MG tablet Take 1 tablet (1 mg total) by mouth daily. (Patient taking differently: Take 1  mg by mouth daily after lunch. ) 90 tablet 3  . hydrOXYzine (VISTARIL) 25 MG capsule Take 1 capsule (25 mg total) by mouth every 8 (eight) hours as needed for anxiety. 90 capsule 1  . hyoscyamine (ANASPAZ) 0.125 MG TBDP disintergrating tablet Place 0.125 mg under the tongue every 6 (six) hours as needed ([name] cramps).     . morphine (MS CONTIN) 60 MG 12 hr tablet Take 60 mg by mouth every 12 (twelve) hours.    . Oxycodone HCl 10 MG TABS Take 10 mg by mouth 3 (three) times daily.     . pantoprazole (PROTONIX) 40 MG tablet Take 1 tablet (40 mg total) by mouth daily. (Patient taking differently: Take 40 mg by mouth at bedtime. ) 30 tablet 0  . rosuvastatin (CRESTOR) 5 MG tablet Take 1 tablet (5 mg total) by mouth every Monday, Wednesday, and Friday at 6 PM. 36 tablet 3  . sertraline (ZOLOFT) 100 MG tablet Take 2 tablets (200 mg total) by mouth at bedtime. 60 tablet 1  . triamcinolone cream (KENALOG) 0.1 % Apply 1 application topically 2 (two) times daily as needed (itching).     . valACYclovir (VALTREX) 500 MG tablet Take 500 mg by mouth 2 (two) times daily as needed (flare ups).    . vitamin B-12 (CYANOCOBALAMIN) 100 MCG tablet Take 1 tablet (100 mcg total) by mouth daily. (Patient taking differently: Take 100 mcg by mouth daily after lunch. ) 90 tablet 3   No current facility-administered medications for this visit.    Facility-Administered Medications Ordered in Other Visits  Medication Dose Route Frequency Provider Last Rate Last Dose  . sodium chloride flush (NS) 0.9 % injection 3 mL  3 mL Intravenous Q12H Josue Hector, MD  Psychiatric Specialty Exam: Review of Systems  Constitutional: Positive for malaise/fatigue.  Gastrointestinal: Positive for abdominal pain and diarrhea.  Musculoskeletal: Positive for myalgias.  Psychiatric/Behavioral: The patient is nervous/anxious.   All other systems reviewed and are negative.   There were no vitals taken for this visit.There is no  height or weight on file to calculate BMI.  General Appearance: NA  Eye Contact:  NA  Speech:  Clear and Coherent and Normal Rate  Volume:  Normal  Mood:  Anxious and Depressed  Affect:  NA  Thought Process:  Goal Directed  Orientation:  Full (Time, Place, and Person)  Thought Content: Logical   Suicidal Thoughts:  No  Homicidal Thoughts:  No  Memory:  Immediate;   Good Recent;   Good Remote;   Good  Judgement:  Good  Insight:  Good  Psychomotor Activity:  NA  Concentration:  Concentration: Good  Recall:  Good  Fund of Knowledge: Good  Language: Good  Akathisia:  Negative  Handed:  Right  AIMS (if indicated): not done  Assets:  Communication Skills Desire for Improvement Financial Resources/Insurance Housing Resilience Social Support  ADL's:  Intact  Cognition: WNL  Sleep:  Fair   Screenings: PHQ2-9     Office Visit from 09/11/2019 in [place] Office Visit from 10/25/2015 in [facility] at [facility] Total Score  1  3  PHQ-9 Total Score  -  18       Assessment and Plan: 68 yo female with chronic anxiety/depression which worsened with increased physical problems/pain. She has not follow up in a month after last visit with me and it has been 6 months since her last visit. She has been for a long time on Cymbalta - we have cross tapered it to sertraline which she tolerated well and dose has been increased to 150 mg as she continued to reports feeling anxious and depressed.  She admits to still being depressed and having low energy. She is on narcotic analgesics and gabapentin for chronic pain. She was taken off clonazepam out of concern about combining benzodiazepines with narcotic analgesics and instead buspirone was added. She finds it completely ineffective for anxiety. She takes Unisom for insomnia instead of previously prescribed  1 mg of clonazepam. She and her husband are very concerned about their daughter Shaunelle Brys) mental health  - she has been diagnosed with bipolar disorder and was hospitalized but is resistant to accepting she has a disorder, is noncompliant with meds and has charges against her for aggressive behavior. They will try to persuade her to call and schedule a visit with one of our psychiatrists.   Dx; Dysthymic disorder; Chronic pain disorder  Plan: Increase Zoloft to 200 mg at HS and add hydroxyzine 25 mg tid prn anxiety (she should dc buspirone). We could again try adding Abilify 2 mg in AM to augment Zoloft.  Next appointment in 8 weeks. The plan was discussed with patient who had an opportunity to ask questions and these were all answered. I spend 25 minutes in phone consultation with the patient.    Stephanie Acre, MD 09/14/2019, 4:28 PM

## 2019-09-15 ENCOUNTER — Telehealth (HOSPITAL_COMMUNITY): Payer: Self-pay

## 2019-09-15 NOTE — Telephone Encounter (Signed)
Medication management - Telephone call with [employer] representative to complete a prior authorization for patient's prescribed Hydroxyzine.  Medication approved through until 12/28/19, case # [id number].  [place] to inform patient's Hydroxyzine was approved by her insurance.

## 2019-09-19 DIAGNOSIS — G894 Chronic pain syndrome: Secondary | ICD-10-CM | POA: Diagnosis not present

## 2019-09-19 DIAGNOSIS — M961 Postlaminectomy syndrome, not elsewhere classified: Secondary | ICD-10-CM | POA: Diagnosis not present

## 2019-09-19 DIAGNOSIS — Z79891 Long term (current) use of opiate analgesic: Secondary | ICD-10-CM | POA: Diagnosis not present

## 2019-09-19 DIAGNOSIS — M25551 Pain in right hip: Secondary | ICD-10-CM | POA: Diagnosis not present

## 2019-10-17 DIAGNOSIS — M961 Postlaminectomy syndrome, not elsewhere classified: Secondary | ICD-10-CM | POA: Diagnosis not present

## 2019-10-17 DIAGNOSIS — Z79891 Long term (current) use of opiate analgesic: Secondary | ICD-10-CM | POA: Diagnosis not present

## 2019-10-17 DIAGNOSIS — G894 Chronic pain syndrome: Secondary | ICD-10-CM | POA: Diagnosis not present

## 2019-10-17 DIAGNOSIS — M25551 Pain in right hip: Secondary | ICD-10-CM | POA: Diagnosis not present

## 2019-10-19 ENCOUNTER — Other Ambulatory Visit: Payer: Self-pay

## 2019-10-20 ENCOUNTER — Other Ambulatory Visit
Admission: RE | Admit: 2019-10-20 | Discharge: 2019-10-20 | Disposition: A | Discharge status: Home / Self Care | Payer: Medicare HMO | Attending: Internal Medicine | Admitting: Internal Medicine

## 2019-10-20 ENCOUNTER — Other Ambulatory Visit: Payer: Self-pay

## 2019-10-20 ENCOUNTER — Inpatient Hospital Stay: Payer: Medicare HMO

## 2019-10-20 ENCOUNTER — Inpatient Hospital Stay: Payer: Medicare HMO | Attending: Internal Medicine

## 2019-10-20 DIAGNOSIS — D751 Secondary polycythemia: Secondary | ICD-10-CM | POA: Insufficient documentation

## 2019-10-20 LAB — CBC WITH DIFFERENTIAL/PLATELET
Abs Immature Granulocytes: 0.02 10*3/uL (ref 0.00–0.07)
Basophils Absolute: 0 10*3/uL (ref 0.0–0.1)
Basophils Relative: 0 %
Eosinophils Absolute: 0.1 10*3/uL (ref 0.0–0.5)
Eosinophils Relative: 2 %
HCT: 45 % (ref 36.0–46.0)
Hemoglobin: 15 g/dL (ref 12.0–15.0)
Immature Granulocytes: 0 %
Lymphocytes Relative: 41 %
Lymphs Abs: 2.9 10*3/uL (ref 0.7–4.0)
MCH: 32.3 pg (ref 26.0–34.0)
MCHC: 33.3 g/dL (ref 30.0–36.0)
MCV: 97 fL (ref 80.0–100.0)
Monocytes Absolute: 0.7 10*3/uL (ref 0.1–1.0)
Monocytes Relative: 9 %
Neutro Abs: 3.4 10*3/uL (ref 1.7–7.7)
Neutrophils Relative %: 48 %
Platelets: 192 10*3/uL (ref 150–400)
RBC: 4.64 MIL/uL (ref 3.87–5.11)
RDW: 12.5 % (ref 11.5–15.5)
WBC: 7.2 10*3/uL (ref 4.0–10.5)
nRBC: 0 % (ref 0.0–0.2)

## 2019-10-24 ENCOUNTER — Encounter: Payer: Self-pay | Admitting: Internal Medicine

## 2019-10-30 LAB — JAK2 EXONS 12-15

## 2019-11-02 ENCOUNTER — Ambulatory Visit (HOSPITAL_COMMUNITY): Payer: Medicare HMO | Admitting: Psychiatry

## 2019-11-02 ENCOUNTER — Other Ambulatory Visit: Payer: Self-pay

## 2019-11-04 ENCOUNTER — Encounter: Payer: Self-pay | Admitting: Physician Assistant

## 2019-11-04 ENCOUNTER — Telehealth: Payer: Self-pay | Admitting: Physician Assistant

## 2019-11-04 NOTE — Telephone Encounter (Signed)
  Patient called the answering service. I called her back. For about 3 weeks she has been having watery, nonbloody diarrhea with some nausea but no vomiting. Some umbilical area abdominal pain. The symptoms are similar to what she has had before but are more prolonged and more severe than in the past. She is urinating well and has managed to keep well-hydrated. She had not been on any antibiotics in last several months. She was started on rosuvastatin about a week prior to the onset of her symptoms. Using hyoscyamine and her usual PPI. Not tried using any antidiarrheals. I advised her to start using Imodium as directed on the package alternatively she could also use Pepto-Bismol but advised her that this could turn her stools dark. Also suggested she stop her rosuvastatin for a few days and see if the diarrhea calmed down. She is to call Dr. Celesta Aver office early next week to arrange an appointment. If stopping the statin helps the diarrhea, she should alert the MD who ordered this as they may want to use alternative statin therapy. She should not permanently stop the statin without alerting the MD.   Routing comment

## 2019-11-06 ENCOUNTER — Ambulatory Visit (INDEPENDENT_AMBULATORY_CARE_PROVIDER_SITE_OTHER): Payer: Medicare HMO | Admitting: Psychiatry

## 2019-11-06 ENCOUNTER — Other Ambulatory Visit: Payer: Self-pay

## 2019-11-06 DIAGNOSIS — F341 Dysthymic disorder: Secondary | ICD-10-CM

## 2019-11-06 DIAGNOSIS — R69 Illness, unspecified: Secondary | ICD-10-CM | POA: Diagnosis not present

## 2019-11-06 MED ORDER — SERTRALINE HCL 100 MG PO TABS: 200.0000 mg | ORAL_TABLET | Freq: Every day | ORAL | 1 refills | Status: DC

## 2019-11-06 MED ORDER — ARIPIPRAZOLE 2 MG PO TABS: 2.0000 mg | ORAL_TABLET | Freq: Every day | ORAL | 1 refills | Status: DC

## 2019-11-06 MED ORDER — HYDROXYZINE PAMOATE 25 MG PO CAPS: 25.0000 mg | ORAL_CAPSULE | Freq: Three times a day (TID) | ORAL | 1 refills | Status: DC | PRN
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
## 2019-11-10 ENCOUNTER — Telehealth: Payer: Medicare HMO

## 2019-11-10 ENCOUNTER — Other Ambulatory Visit: Payer: Self-pay

## 2019-11-14 DIAGNOSIS — M961 Postlaminectomy syndrome, not elsewhere classified: Secondary | ICD-10-CM | POA: Diagnosis not present

## 2019-11-14 DIAGNOSIS — Z79891 Long term (current) use of opiate analgesic: Secondary | ICD-10-CM | POA: Diagnosis not present

## 2019-11-14 DIAGNOSIS — M25551 Pain in right hip: Secondary | ICD-10-CM | POA: Diagnosis not present

## 2019-11-14 DIAGNOSIS — G894 Chronic pain syndrome: Secondary | ICD-10-CM | POA: Diagnosis not present

## 2019-11-28 ENCOUNTER — Other Ambulatory Visit: Payer: Medicare HMO

## 2019-12-04 ENCOUNTER — Other Ambulatory Visit: Payer: Medicare HMO | Admitting: *Deleted

## 2019-12-04 ENCOUNTER — Other Ambulatory Visit: Payer: Self-pay

## 2019-12-04 DIAGNOSIS — E785 Hyperlipidemia, unspecified: Secondary | ICD-10-CM

## 2019-12-04 DIAGNOSIS — Z0100 Encounter for examination of eyes and vision without abnormal findings: Secondary | ICD-10-CM | POA: Diagnosis not present

## 2019-12-04 LAB — HEPATIC FUNCTION PANEL
ALT: 6 IU/L (ref 0–32)
AST: 20 IU/L (ref 0–40)
Albumin: 3.9 g/dL (ref 3.8–4.8)
Alkaline Phosphatase: 94 IU/L (ref 39–117)
Bilirubin Total: 0.4 mg/dL (ref 0.0–1.2)
Bilirubin, Direct: 0.11 mg/dL (ref 0.00–0.40)
Total Protein: 6.5 g/dL (ref 6.0–8.5)

## 2019-12-04 LAB — LIPID PANEL
Chol/HDL Ratio: 2 ratio (ref 0.0–4.4)
Cholesterol, Total: 126 mg/dL (ref 100–199)
HDL: 62 mg/dL (ref >39)
LDL Chol Calc (NIH): 47 mg/dL (ref 0–99)
Triglycerides: 93 mg/dL (ref 0–149)
VLDL Cholesterol Cal: 17 mg/dL (ref 5–40)

## 2019-12-06 ENCOUNTER — Telehealth: Payer: Self-pay | Admitting: *Deleted

## 2019-12-06 DIAGNOSIS — I2510 Atherosclerotic heart disease of native coronary artery without angina pectoris: Secondary | ICD-10-CM

## 2019-12-06 DIAGNOSIS — E785 Hyperlipidemia, unspecified: Secondary | ICD-10-CM

## 2019-12-06 NOTE — Telephone Encounter (Signed)
Pt has been notified of lab results by phone with verbal understanding. Pt is agreeable to repeat labs in 6 months 06/05/20 FLP/LFT. Pt thanked me for the call. Orders and have been placed. Results have been sent to [facility]. Patient notified of result.  Please refer to phone note from today for complete details.   Julaine Hua, [facility] 12/06/2019 2:29 PM

## 2019-12-11 ENCOUNTER — Telehealth: Payer: Self-pay | Admitting: Internal Medicine

## 2019-12-11 NOTE — Telephone Encounter (Signed)
Left message for patient to call back  

## 2019-12-13 NOTE — Telephone Encounter (Signed)
Patient's appt has been rescheduled to 12/20/19 3:00

## 2019-12-18 DIAGNOSIS — Z79891 Long term (current) use of opiate analgesic: Secondary | ICD-10-CM | POA: Diagnosis not present

## 2019-12-18 DIAGNOSIS — G894 Chronic pain syndrome: Secondary | ICD-10-CM | POA: Diagnosis not present

## 2019-12-18 DIAGNOSIS — M961 Postlaminectomy syndrome, not elsewhere classified: Secondary | ICD-10-CM | POA: Diagnosis not present

## 2019-12-18 DIAGNOSIS — M25551 Pain in right hip: Secondary | ICD-10-CM | POA: Diagnosis not present

## 2019-12-19 ENCOUNTER — Ambulatory Visit (INDEPENDENT_AMBULATORY_CARE_PROVIDER_SITE_OTHER): Payer: Medicare HMO | Admitting: Psychiatry

## 2019-12-19 ENCOUNTER — Other Ambulatory Visit: Payer: Self-pay

## 2019-12-19 DIAGNOSIS — G8929 Other chronic pain: Secondary | ICD-10-CM

## 2019-12-19 DIAGNOSIS — F341 Dysthymic disorder: Secondary | ICD-10-CM | POA: Diagnosis not present

## 2019-12-19 DIAGNOSIS — R69 Illness, unspecified: Secondary | ICD-10-CM | POA: Diagnosis not present

## 2019-12-19 MED ORDER — HYDROXYZINE PAMOATE 25 MG PO CAPS: 25.0000 mg | ORAL_CAPSULE | Freq: Three times a day (TID) | ORAL | 2 refills | Status: DC | PRN

## 2019-12-19 MED ORDER — SERTRALINE HCL 100 MG PO TABS: 200.0000 mg | ORAL_TABLET | Freq: Every day | ORAL | 0 refills | Status: DC

## 2019-12-19 MED ORDER — BUSPIRONE HCL 10 MG PO TABS: 10.0000 mg | ORAL_TABLET | Freq: Three times a day (TID) | ORAL | 5 refills | Status: DC

## 2019-12-19 MED ORDER — ARIPIPRAZOLE 2 MG PO TABS: 2.0000 mg | ORAL_TABLET | Freq: Every day | ORAL | 1 refills | Status: DC
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
## 2019-12-20 ENCOUNTER — Ambulatory Visit: Payer: Medicare HMO | Admitting: Physician Assistant

## 2019-12-23 ENCOUNTER — Emergency Department (HOSPITAL_COMMUNITY): Payer: Medicare HMO

## 2019-12-23 ENCOUNTER — Encounter (HOSPITAL_COMMUNITY): Payer: Self-pay | Admitting: Emergency Medicine

## 2019-12-23 ENCOUNTER — Inpatient Hospital Stay (HOSPITAL_COMMUNITY)
Admission: EM | Admit: 2019-12-23 | Discharge: 2020-01-08 | DRG: 444 | Disposition: A | Discharge status: Home Health | Payer: Medicare HMO | Attending: Internal Medicine | Admitting: Internal Medicine

## 2019-12-23 ENCOUNTER — Other Ambulatory Visit: Payer: Self-pay

## 2019-12-23 DIAGNOSIS — J439 Emphysema, unspecified: Secondary | ICD-10-CM | POA: Diagnosis not present

## 2019-12-23 DIAGNOSIS — Z20828 Contact with and (suspected) exposure to other viral communicable diseases: Secondary | ICD-10-CM | POA: Diagnosis not present

## 2019-12-23 DIAGNOSIS — K8010 Calculus of gallbladder with chronic cholecystitis without obstruction: Secondary | ICD-10-CM | POA: Diagnosis present

## 2019-12-23 DIAGNOSIS — I110 Hypertensive heart disease with heart failure: Secondary | ICD-10-CM | POA: Diagnosis present

## 2019-12-23 DIAGNOSIS — Z800 Family history of malignant neoplasm of digestive organs: Secondary | ICD-10-CM

## 2019-12-23 DIAGNOSIS — E875 Hyperkalemia: Secondary | ICD-10-CM | POA: Diagnosis present

## 2019-12-23 DIAGNOSIS — Z79891 Long term (current) use of opiate analgesic: Secondary | ICD-10-CM

## 2019-12-23 DIAGNOSIS — I959 Hypotension, unspecified: Secondary | ICD-10-CM | POA: Diagnosis not present

## 2019-12-23 DIAGNOSIS — D696 Thrombocytopenia, unspecified: Secondary | ICD-10-CM | POA: Diagnosis not present

## 2019-12-23 DIAGNOSIS — I509 Heart failure, unspecified: Secondary | ICD-10-CM | POA: Diagnosis not present

## 2019-12-23 DIAGNOSIS — Z79899 Other long term (current) drug therapy: Secondary | ICD-10-CM

## 2019-12-23 DIAGNOSIS — Z888 Allergy status to other drugs, medicaments and biological substances status: Secondary | ICD-10-CM

## 2019-12-23 DIAGNOSIS — F319 Bipolar disorder, unspecified: Secondary | ICD-10-CM | POA: Diagnosis present

## 2019-12-23 DIAGNOSIS — D62 Acute posthemorrhagic anemia: Secondary | ICD-10-CM | POA: Diagnosis not present

## 2019-12-23 DIAGNOSIS — G894 Chronic pain syndrome: Secondary | ICD-10-CM | POA: Diagnosis present

## 2019-12-23 DIAGNOSIS — J9622 Acute and chronic respiratory failure with hypercapnia: Secondary | ICD-10-CM | POA: Diagnosis not present

## 2019-12-23 DIAGNOSIS — K760 Fatty (change of) liver, not elsewhere classified: Secondary | ICD-10-CM | POA: Diagnosis present

## 2019-12-23 DIAGNOSIS — J9602 Acute respiratory failure with hypercapnia: Secondary | ICD-10-CM | POA: Diagnosis present

## 2019-12-23 DIAGNOSIS — M1990 Unspecified osteoarthritis, unspecified site: Secondary | ICD-10-CM | POA: Diagnosis present

## 2019-12-23 DIAGNOSIS — J9621 Acute and chronic respiratory failure with hypoxia: Secondary | ICD-10-CM | POA: Diagnosis not present

## 2019-12-23 DIAGNOSIS — R1084 Generalized abdominal pain: Secondary | ICD-10-CM | POA: Diagnosis not present

## 2019-12-23 DIAGNOSIS — K580 Irritable bowel syndrome with diarrhea: Secondary | ICD-10-CM | POA: Diagnosis present

## 2019-12-23 DIAGNOSIS — J9600 Acute respiratory failure, unspecified whether with hypoxia or hypercapnia: Secondary | ICD-10-CM | POA: Diagnosis not present

## 2019-12-23 DIAGNOSIS — K269 Duodenal ulcer, unspecified as acute or chronic, without hemorrhage or perforation: Secondary | ICD-10-CM | POA: Diagnosis not present

## 2019-12-23 DIAGNOSIS — Z8719 Personal history of other diseases of the digestive system: Secondary | ICD-10-CM

## 2019-12-23 DIAGNOSIS — I5033 Acute on chronic diastolic (congestive) heart failure: Secondary | ICD-10-CM | POA: Diagnosis not present

## 2019-12-23 DIAGNOSIS — K810 Acute cholecystitis: Secondary | ICD-10-CM | POA: Diagnosis present

## 2019-12-23 DIAGNOSIS — Z811 Family history of alcohol abuse and dependence: Secondary | ICD-10-CM

## 2019-12-23 DIAGNOSIS — F1021 Alcohol dependence, in remission: Secondary | ICD-10-CM | POA: Diagnosis present

## 2019-12-23 DIAGNOSIS — Z681 Body mass index (BMI) 19 or less, adult: Secondary | ICD-10-CM

## 2019-12-23 DIAGNOSIS — K8062 Calculus of gallbladder and bile duct with acute cholecystitis without obstruction: Secondary | ICD-10-CM | POA: Diagnosis not present

## 2019-12-23 DIAGNOSIS — G9341 Metabolic encephalopathy: Secondary | ICD-10-CM | POA: Diagnosis present

## 2019-12-23 DIAGNOSIS — K5730 Diverticulosis of large intestine without perforation or abscess without bleeding: Secondary | ICD-10-CM | POA: Diagnosis present

## 2019-12-23 DIAGNOSIS — E440 Moderate protein-calorie malnutrition: Secondary | ICD-10-CM | POA: Insufficient documentation

## 2019-12-23 DIAGNOSIS — R1013 Epigastric pain: Secondary | ICD-10-CM

## 2019-12-23 DIAGNOSIS — E538 Deficiency of other specified B group vitamins: Secondary | ICD-10-CM | POA: Diagnosis present

## 2019-12-23 DIAGNOSIS — K219 Gastro-esophageal reflux disease without esophagitis: Secondary | ICD-10-CM | POA: Diagnosis not present

## 2019-12-23 DIAGNOSIS — K921 Melena: Secondary | ICD-10-CM | POA: Diagnosis not present

## 2019-12-23 DIAGNOSIS — D649 Anemia, unspecified: Secondary | ICD-10-CM | POA: Diagnosis present

## 2019-12-23 DIAGNOSIS — K264 Chronic or unspecified duodenal ulcer with hemorrhage: Secondary | ICD-10-CM

## 2019-12-23 DIAGNOSIS — K8050 Calculus of bile duct without cholangitis or cholecystitis without obstruction: Secondary | ICD-10-CM | POA: Diagnosis present

## 2019-12-23 DIAGNOSIS — F1010 Alcohol abuse, uncomplicated: Secondary | ICD-10-CM | POA: Diagnosis present

## 2019-12-23 DIAGNOSIS — J9601 Acute respiratory failure with hypoxia: Secondary | ICD-10-CM | POA: Diagnosis not present

## 2019-12-23 DIAGNOSIS — K8051 Calculus of bile duct without cholangitis or cholecystitis with obstruction: Secondary | ICD-10-CM | POA: Diagnosis not present

## 2019-12-23 DIAGNOSIS — I2510 Atherosclerotic heart disease of native coronary artery without angina pectoris: Secondary | ICD-10-CM | POA: Diagnosis present

## 2019-12-23 DIAGNOSIS — R935 Abnormal findings on diagnostic imaging of other abdominal regions, including retroperitoneum: Secondary | ICD-10-CM | POA: Diagnosis present

## 2019-12-23 DIAGNOSIS — F17210 Nicotine dependence, cigarettes, uncomplicated: Secondary | ICD-10-CM | POA: Diagnosis present

## 2019-12-23 DIAGNOSIS — E872 Acidosis: Secondary | ICD-10-CM | POA: Diagnosis not present

## 2019-12-23 DIAGNOSIS — R1010 Upper abdominal pain, unspecified: Secondary | ICD-10-CM | POA: Diagnosis not present

## 2019-12-23 DIAGNOSIS — I10 Essential (primary) hypertension: Secondary | ICD-10-CM | POA: Diagnosis not present

## 2019-12-23 DIAGNOSIS — R0902 Hypoxemia: Secondary | ICD-10-CM

## 2019-12-23 DIAGNOSIS — Z86010 Personal history of colonic polyps: Secondary | ICD-10-CM

## 2019-12-23 DIAGNOSIS — J449 Chronic obstructive pulmonary disease, unspecified: Secondary | ICD-10-CM | POA: Diagnosis present

## 2019-12-23 DIAGNOSIS — F419 Anxiety disorder, unspecified: Secondary | ICD-10-CM | POA: Diagnosis present

## 2019-12-23 DIAGNOSIS — R0602 Shortness of breath: Secondary | ICD-10-CM | POA: Diagnosis not present

## 2019-12-23 DIAGNOSIS — Z7982 Long term (current) use of aspirin: Secondary | ICD-10-CM

## 2019-12-23 DIAGNOSIS — T4275XA Adverse effect of unspecified antiepileptic and sedative-hypnotic drugs, initial encounter: Secondary | ICD-10-CM | POA: Diagnosis present

## 2019-12-23 DIAGNOSIS — G8929 Other chronic pain: Secondary | ICD-10-CM | POA: Diagnosis present

## 2019-12-23 DIAGNOSIS — Y92238 Other place in hospital as the place of occurrence of the external cause: Secondary | ICD-10-CM | POA: Diagnosis not present

## 2019-12-23 DIAGNOSIS — K8020 Calculus of gallbladder without cholecystitis without obstruction: Secondary | ICD-10-CM

## 2019-12-23 DIAGNOSIS — R69 Illness, unspecified: Secondary | ICD-10-CM | POA: Diagnosis not present

## 2019-12-23 DIAGNOSIS — Z20822 Contact with and (suspected) exposure to covid-19: Secondary | ICD-10-CM | POA: Diagnosis present

## 2019-12-23 DIAGNOSIS — Z882 Allergy status to sulfonamides status: Secondary | ICD-10-CM

## 2019-12-23 DIAGNOSIS — K8000 Calculus of gallbladder with acute cholecystitis without obstruction: Secondary | ICD-10-CM | POA: Diagnosis not present

## 2019-12-23 DIAGNOSIS — K838 Other specified diseases of biliary tract: Secondary | ICD-10-CM | POA: Diagnosis not present

## 2019-12-23 DIAGNOSIS — N289 Disorder of kidney and ureter, unspecified: Secondary | ICD-10-CM | POA: Diagnosis not present

## 2019-12-23 DIAGNOSIS — M810 Age-related osteoporosis without current pathological fracture: Secondary | ICD-10-CM | POA: Diagnosis present

## 2019-12-23 DIAGNOSIS — Z8042 Family history of malignant neoplasm of prostate: Secondary | ICD-10-CM

## 2019-12-23 DIAGNOSIS — F17200 Nicotine dependence, unspecified, uncomplicated: Secondary | ICD-10-CM | POA: Diagnosis present

## 2019-12-23 DIAGNOSIS — J984 Other disorders of lung: Secondary | ICD-10-CM | POA: Diagnosis not present

## 2019-12-23 DIAGNOSIS — Z8249 Family history of ischemic heart disease and other diseases of the circulatory system: Secondary | ICD-10-CM

## 2019-12-23 DIAGNOSIS — R112 Nausea with vomiting, unspecified: Secondary | ICD-10-CM | POA: Diagnosis present

## 2019-12-23 DIAGNOSIS — R109 Unspecified abdominal pain: Secondary | ICD-10-CM | POA: Diagnosis present

## 2019-12-23 DIAGNOSIS — E876 Hypokalemia: Secondary | ICD-10-CM | POA: Diagnosis present

## 2019-12-23 DIAGNOSIS — Z808 Family history of malignant neoplasm of other organs or systems: Secondary | ICD-10-CM

## 2019-12-23 LAB — COMPREHENSIVE METABOLIC PANEL
ALT: 12 U/L (ref 0–44)
AST: 36 U/L (ref 15–41)
Albumin: 3.3 g/dL — ABNORMAL LOW (ref 3.5–5.0)
Alkaline Phosphatase: 97 U/L (ref 38–126)
Anion gap: 11 (ref 5–15)
BUN: <5 mg/dL — ABNORMAL LOW (ref 8–23)
CO2: 32 mmol/L (ref 22–32)
Calcium: 8.6 mg/dL — ABNORMAL LOW (ref 8.9–10.3)
Chloride: 92 mmol/L — ABNORMAL LOW (ref 98–111)
Creatinine, Ser: 0.85 mg/dL (ref 0.44–1.00)
GFR calc Af Amer: >60 mL/min (ref >60)
GFR calc non Af Amer: >60 mL/min (ref >60)
Glucose, Bld: 121 mg/dL — ABNORMAL HIGH (ref 70–99)
Potassium: 3.9 mmol/L (ref 3.5–5.1)
Sodium: 135 mmol/L (ref 135–145)
Total Bilirubin: 0.5 mg/dL (ref 0.3–1.2)
Total Protein: 5.8 g/dL — ABNORMAL LOW (ref 6.5–8.1)

## 2019-12-23 LAB — URINALYSIS, ROUTINE W REFLEX MICROSCOPIC
Bacteria, UA: NONE SEEN
Bilirubin Urine: NEGATIVE
Glucose, UA: NEGATIVE mg/dL
Hgb urine dipstick: NEGATIVE
Ketones, ur: NEGATIVE mg/dL
Leukocytes,Ua: NEGATIVE
Nitrite: NEGATIVE
Protein, ur: NEGATIVE mg/dL
Specific Gravity, Urine: 1.044 — ABNORMAL HIGH (ref 1.005–1.030)
pH: 6 (ref 5.0–8.0)

## 2019-12-23 LAB — CBC
HCT: 49.9 % — ABNORMAL HIGH (ref 36.0–46.0)
Hemoglobin: 15.8 g/dL — ABNORMAL HIGH (ref 12.0–15.0)
MCH: 32.2 pg (ref 26.0–34.0)
MCHC: 31.7 g/dL (ref 30.0–36.0)
MCV: 101.6 fL — ABNORMAL HIGH (ref 80.0–100.0)
Platelets: 181 10*3/uL (ref 150–400)
RBC: 4.91 MIL/uL (ref 3.87–5.11)
RDW: 13.7 % (ref 11.5–15.5)
WBC: 6.4 10*3/uL (ref 4.0–10.5)
nRBC: 0 % (ref 0.0–0.2)

## 2019-12-23 LAB — POC SARS CORONAVIRUS 2 AG -  ED: SARS Coronavirus 2 Ag: NEGATIVE

## 2019-12-23 LAB — LIPASE, BLOOD: Lipase: 20 U/L (ref 11–51)

## 2019-12-23 LAB — HIV ANTIBODY (ROUTINE TESTING W REFLEX): HIV Screen 4th Generation wRfx: NONREACTIVE

## 2019-12-23 LAB — SARS CORONAVIRUS 2 (TAT 6-24 HRS): SARS Coronavirus 2: NEGATIVE

## 2019-12-23 MED ORDER — FOLIC ACID 1 MG PO TABS
1.0000 mg | ORAL_TABLET | Freq: Every day | ORAL | Status: DC
  Administered 2019-12-24 – 2020-01-07 (×12): 1 mg via ORAL
  Filled 2019-12-23 (×13): qty 1

## 2019-12-23 MED ORDER — SODIUM CHLORIDE 0.9 % IV BOLUS: 1000.0000 mL | Freq: Once | INTRAVENOUS | Status: DC

## 2019-12-23 MED ORDER — GABAPENTIN 300 MG PO CAPS
300.0000 mg | ORAL_CAPSULE | Freq: Three times a day (TID) | ORAL | Status: DC
  Administered 2019-12-24 – 2020-01-08 (×44): 300 mg via ORAL
  Filled 2019-12-23 (×44): qty 1

## 2019-12-23 MED ORDER — SERTRALINE HCL 100 MG PO TABS
200.0000 mg | ORAL_TABLET | Freq: Every day | ORAL | Status: DC
  Administered 2019-12-24 – 2020-01-07 (×14): 200 mg via ORAL
  Filled 2019-12-23 (×16): qty 2

## 2019-12-23 MED ORDER — SODIUM CHLORIDE 0.9 % IV BOLUS
1000.0000 mL | Freq: Once | INTRAVENOUS | Status: AC
  Administered 2019-12-23: 1000 mL via INTRAVENOUS

## 2019-12-23 MED ORDER — CHOLESTYRAMINE 4 G PO PACK: 4.0000 g | PACK | Freq: Two times a day (BID) | ORAL | Status: DC | PRN

## 2019-12-23 MED ORDER — ROSUVASTATIN CALCIUM 5 MG PO TABS
5.0000 mg | ORAL_TABLET | ORAL | Status: DC
  Administered 2019-12-25 – 2020-01-05 (×6): 5 mg via ORAL
  Filled 2019-12-23 (×6): qty 1

## 2019-12-23 MED ORDER — SODIUM CHLORIDE 0.9 % IV SOLN: INTRAVENOUS | Status: DC

## 2019-12-23 MED ORDER — VITAMIN B-12 100 MCG PO TABS
100.0000 ug | ORAL_TABLET | Freq: Every day | ORAL | Status: DC
  Administered 2019-12-24 – 2020-01-07 (×11): 100 ug via ORAL
  Filled 2019-12-23 (×18): qty 1

## 2019-12-23 MED ORDER — SODIUM CHLORIDE 0.9% FLUSH
3.0000 mL | Freq: Once | INTRAVENOUS | Status: AC
  Administered 2019-12-23: 3 mL via INTRAVENOUS

## 2019-12-23 MED ORDER — HYDROXYZINE PAMOATE 25 MG PO CAPS
25.0000 mg | ORAL_CAPSULE | Freq: Three times a day (TID) | ORAL | Status: DC | PRN
  Filled 2019-12-23: qty 1

## 2019-12-23 MED ORDER — PANTOPRAZOLE SODIUM 40 MG PO TBEC
40.0000 mg | DELAYED_RELEASE_TABLET | Freq: Every day | ORAL | Status: DC
  Administered 2019-12-24 – 2020-01-04 (×11): 40 mg via ORAL
  Filled 2019-12-23 (×12): qty 1

## 2019-12-23 MED ORDER — VALACYCLOVIR HCL 500 MG PO TABS
500.0000 mg | ORAL_TABLET | Freq: Two times a day (BID) | ORAL | Status: DC | PRN
  Filled 2019-12-23: qty 1

## 2019-12-23 MED ORDER — OXYCODONE HCL ER 10 MG PO T12A
10.0000 mg | EXTENDED_RELEASE_TABLET | Freq: Once | ORAL | Status: AC
  Administered 2019-12-23: 10 mg via ORAL
  Filled 2019-12-23: qty 1

## 2019-12-23 MED ORDER — ACETAMINOPHEN 650 MG RE SUPP: 650.0000 mg | Freq: Four times a day (QID) | RECTAL | Status: DC | PRN

## 2019-12-23 MED ORDER — IOHEXOL 300 MG/ML  SOLN
100.0000 mL | Freq: Once | INTRAMUSCULAR | Status: AC | PRN
  Administered 2019-12-23: 100 mL via INTRAVENOUS

## 2019-12-23 MED ORDER — SODIUM CHLORIDE 0.9 % IV SOLN
3.0000 g | Freq: Four times a day (QID) | INTRAVENOUS | Status: DC
  Administered 2019-12-23 – 2020-01-08 (×60): 3 g via INTRAVENOUS
  Filled 2019-12-23 (×2): qty 3
  Filled 2019-12-23: qty 8
  Filled 2019-12-23: qty 3
  Filled 2019-12-23: qty 8
  Filled 2019-12-23 (×3): qty 3
  Filled 2019-12-23: qty 8
  Filled 2019-12-23: qty 3
  Filled 2019-12-23: qty 8
  Filled 2019-12-23 (×2): qty 3
  Filled 2019-12-23: qty 8
  Filled 2019-12-23 (×2): qty 3
  Filled 2019-12-23: qty 8
  Filled 2019-12-23: qty 3
  Filled 2019-12-23: qty 8
  Filled 2019-12-23: qty 3
  Filled 2019-12-23: qty 8
  Filled 2019-12-23: qty 3
  Filled 2019-12-23: qty 8
  Filled 2019-12-23 (×14): qty 3
  Filled 2019-12-23: qty 8
  Filled 2019-12-23: qty 3
  Filled 2019-12-23: qty 8
  Filled 2019-12-23 (×6): qty 3
  Filled 2019-12-23: qty 8
  Filled 2019-12-23: qty 3
  Filled 2019-12-23 (×2): qty 8
  Filled 2019-12-23: qty 3
  Filled 2019-12-23 (×2): qty 8
  Filled 2019-12-23 (×3): qty 3
  Filled 2019-12-23: qty 8
  Filled 2019-12-23 (×9): qty 3
  Filled 2019-12-23 (×2): qty 8
  Filled 2019-12-23 (×2): qty 3

## 2019-12-23 MED ORDER — VITAMIN D 25 MCG (1000 UNIT) PO TABS
2000.0000 [IU] | ORAL_TABLET | Freq: Every day | ORAL | Status: DC
  Administered 2019-12-24 – 2020-01-07 (×13): 2000 [IU] via ORAL
  Filled 2019-12-23 (×14): qty 2

## 2019-12-23 MED ORDER — ONDANSETRON HCL 4 MG/2ML IJ SOLN
4.0000 mg | Freq: Four times a day (QID) | INTRAMUSCULAR | Status: DC | PRN
  Administered 2019-12-26 – 2020-01-03 (×3): 4 mg via INTRAVENOUS
  Filled 2019-12-23 (×4): qty 2

## 2019-12-23 MED ORDER — OXYCODONE HCL 5 MG PO TABS
10.0000 mg | ORAL_TABLET | Freq: Four times a day (QID) | ORAL | Status: DC | PRN
  Administered 2019-12-24 – 2020-01-08 (×36): 10 mg via ORAL
  Filled 2019-12-23 (×37): qty 2

## 2019-12-23 MED ORDER — MORPHINE SULFATE ER 15 MG PO TBCR
60.0000 mg | EXTENDED_RELEASE_TABLET | Freq: Two times a day (BID) | ORAL | Status: DC
  Administered 2019-12-24 – 2020-01-08 (×30): 60 mg via ORAL
  Filled 2019-12-23 (×6): qty 4
  Filled 2019-12-23: qty 1
  Filled 2019-12-23 (×7): qty 4
  Filled 2019-12-23 (×2): qty 1
  Filled 2019-12-23 (×14): qty 4

## 2019-12-23 MED ORDER — HYDROXYZINE HCL 25 MG PO TABS: 25.0000 mg | ORAL_TABLET | Freq: Three times a day (TID) | ORAL | Status: DC | PRN

## 2019-12-23 MED ORDER — ACETAMINOPHEN 325 MG PO TABS
650.0000 mg | ORAL_TABLET | Freq: Four times a day (QID) | ORAL | Status: DC | PRN
  Administered 2019-12-25 – 2020-01-06 (×4): 650 mg via ORAL
  Filled 2019-12-23 (×4): qty 2

## 2019-12-23 MED ORDER — ARIPIPRAZOLE 2 MG PO TABS
2.0000 mg | ORAL_TABLET | Freq: Every day | ORAL | Status: DC
  Administered 2019-12-24 – 2020-01-07 (×14): 2 mg via ORAL
  Filled 2019-12-23 (×19): qty 1

## 2019-12-23 MED ORDER — BUSPIRONE HCL 5 MG PO TABS
10.0000 mg | ORAL_TABLET | Freq: Three times a day (TID) | ORAL | Status: DC
  Administered 2019-12-24 – 2020-01-08 (×44): 10 mg via ORAL
  Filled 2019-12-23 (×8): qty 2
  Filled 2019-12-23 (×2): qty 1
  Filled 2019-12-23 (×5): qty 2
  Filled 2019-12-23: qty 1
  Filled 2019-12-23: qty 2
  Filled 2019-12-23: qty 1
  Filled 2019-12-23 (×4): qty 2
  Filled 2019-12-23: qty 1
  Filled 2019-12-23 (×4): qty 2
  Filled 2019-12-23: qty 1
  Filled 2019-12-23 (×10): qty 2
  Filled 2019-12-23 (×2): qty 1
  Filled 2019-12-23 (×2): qty 2
  Filled 2019-12-23: qty 1
  Filled 2019-12-23 (×2): qty 2

## 2019-12-23 MED ORDER — ONDANSETRON HCL 4 MG/2ML IJ SOLN
4.0000 mg | Freq: Once | INTRAMUSCULAR | Status: AC
  Administered 2019-12-23: 4 mg via INTRAVENOUS
  Filled 2019-12-23: qty 2

## 2019-12-23 MED ORDER — ONDANSETRON HCL 4 MG PO TABS
4.0000 mg | ORAL_TABLET | Freq: Four times a day (QID) | ORAL | Status: DC | PRN
  Administered 2019-12-30: 4 mg via ORAL
  Filled 2019-12-23: qty 1

## 2019-12-23 NOTE — ED Notes (Signed)
Pt returned from [place], nad noted

## 2019-12-23 NOTE — ED Notes (Signed)
Patient transported to CT 

## 2019-12-23 NOTE — ED Notes (Signed)
GOT PATIENT INTO A GOWN PATIENT IS RESTING WITH CALL BELL IN REACH

## 2019-12-23 NOTE — H&P (Signed)
History and Physical    AARNAVI King [id number] DOB: 10/31/1951 DOA: 12/23/2019  PCP: Biagio Borg, MD  Patient coming from: Home  I have personally briefly reviewed patient's old medical records in [place]  Chief Complaint: [name], abd pain  HPI: Denise King is a 68 y.o. female with medical history significant of chronic pain syndrome, [name].  Patient has had chronic diarrhea for several months with LLQ abd pain that she associates with her [name].  For the past 24H however she has had [name] and periumbilical / epigastric abd pain that is new.  Tm 99.5 with EMS.  Patient has had exposure to COVID-19 positive family member about 10 days ago.  Vomiting is non-bloody, no melena, no CP, syncope.  Does complain of cough and SOB at time of transport but satting >90% on RA.   ED Course: Satting 100% on 2L [place] for comfort.  CT chest is neg for PE, shows B basilar PNA vs actelectasis.  WBC nl, no SIRS.  CT abd pelvis shows 29mm CBD, cholelithiasis, and flow void in CBD suspicious for duct stone.  [place] RUQ shows gallstones in gallbladder with pericholecystic fluid, no Murphy sign, 10.42mm duct, equivocal findings for acute cholecystitis.  LFTs are normal.   Review of Systems: As per HPI, otherwise all review of systems negative.  Past Medical History:  Diagnosis Date  . Acute cystitis   . Acute respiratory failure ([place])   . Allergy    as a child grew out of them  . Anemia    pernicious anemia  . Anxiety   . Arthritis   . Back pain   . Blood transfusion   . CAP (community acquired pneumonia)   . Chronic pain syndrome   . Depression   . Diverticulosis of colon   . Dysthymia   . Esophagitis   . EtOH dependence ([place])   . Gastritis   . GERD (gastroesophageal reflux disease)   . H/O chest pain Dec. 2013   no work up done  . Hx of colonic polyps   . Hypertension   . Irritable bowel syndrome   . Osteopenia   . Osteoporosis   . Pneumonia 2019  .  Polypharmacy   . Reflux esophagitis   . Right sided sciatica 12/22/2017  . Tobacco use disorder   . Unspecified chronic bronchitis ([place])   . Vertigo   . Vitamin B12 deficiency   . Wears glasses     Past Surgical History:  Procedure Laterality Date  . APPENDECTOMY    . BACK SURGERY  10-09   Dr. Patrice Paradise  . CARPAL TUNNEL RELEASE Right 08/14/2014   Procedure: RIGHT CARPAL TUNNEL RELEASE AND INJECT LEFT THUMB;  Surgeon: Daryll Brod, MD;  Location: [facility];  Service: Orthopedics;  Laterality: Right;  . COLONOSCOPY    . INTRAMEDULLARY (IM) NAIL INTERTROCHANTERIC Right 10/01/2018   Procedure: INTRAMEDULLARY (IM) NAIL INTERTROCHANTRIC;  Surgeon: Thornton Park, MD;  Location: [facility];  Service: Orthopedics;  Laterality: Right;  . LAPAROSCOPY N/A 02/21/2013   Procedure: LAPAROSCOPY OPERATIVE;  Surgeon: Margarette Asal, MD;  Location: [facility];  Service: Gynecology;  Laterality: N/A;  REQUESTING 5MM SCOPE WITH CAMERA  . LEFT HEART CATH AND CORONARY ANGIOGRAPHY N/A 05/11/2019   Procedure: LEFT HEART CATH AND CORONARY ANGIOGRAPHY;  Surgeon: Sherren Mocha, MD;  Location: [facility];  Service: Cardiovascular;  Laterality: N/A;  . TONSILLECTOMY    . TUBAL LIGATION    . UPPER GASTROINTESTINAL  ENDOSCOPY       reports that she has been smoking cigarettes. She has a 30.00 pack-year smoking history. She has never used smokeless tobacco. She reports current alcohol use of about 3.0 standard drinks of alcohol per week. She reports that she does not use drugs.  Allergies  Allergen Reactions  . Atorvastatin Nausea And Vomiting  . Levofloxacin Other (See Comments)    Reaction: achilles tendon pain  . Omnicef [Cefdinir] Diarrhea    Uncontrollable diarrhea  . Trazodone And Nefazodone Other (See Comments)    "Felt like I was going to faint"  . Sulfa Antibiotics Rash  . Sulfonamide Derivatives Rash    Family History  Problem Relation Age of Onset  . Colon cancer Father   .  Prostate cancer Father   . Heart disease Mother        prev MVR, also has DJD  . Colon cancer Brother   . Skin cancer Sister   . Alcohol abuse Daughter   . Bipolar disorder Daughter   . Drug abuse Daughter   . Esophageal cancer Neg Hx   . Rectal cancer Neg Hx   . Stomach cancer Neg Hx      Prior to Admission medications   Medication Sig Start Date End Date Taking? Authorizing Provider  gabapentin (NEURONTIN) 300 MG capsule Take 300 mg by mouth 3 (three) times daily.   Yes [provider]  morphine (MS CONTIN) 60 MG 12 hr tablet Take 60 mg by mouth every 12 (twelve) hours. 05/02/19  Yes [provider]  Oxycodone HCl 10 MG TABS Take 10 mg by mouth 4 (four) times daily.  03/06/19  Yes [provider]  ARIPiprazole (ABILIFY) 2 MG tablet Take 1 tablet (2 mg total) by mouth daily. 12/19/19 02/17/20  Pucilowski, Marchia Bond, MD  aspirin 81 MG chewable tablet Chew 1 tablet (81 mg total) by mouth daily. 04/12/19   Sheikh, Georgina Quint Latif, DO  busPIRone (BUSPAR) 10 MG tablet Take 1 tablet (10 mg total) by mouth 3 (three) times daily. 12/19/19   Pucilowski, Marchia Bond, MD  Cholecalciferol (VITAMIN D) 50 MCG (2000 UT) tablet Take 2,000 Units by mouth daily after lunch.    [provider]  cholestyramine Lucrezia Starch) 4 GM/DOSE powder Take 1 packet (4 g total) by mouth 2 (two) times daily with a meal. Patient taking differently: Take 4 g by mouth 2 (two) times daily as needed ([name] (take with meals)).  03/24/19   Gatha Mayer, MD  denosumab (PROLIA) 60 MG/ML SOSY injection Inject 60 mg into the skin every 6 (six) months.    [provider]  diphenhydramine-acetaminophen (TYLENOL PM) 25-500 MG TABS tablet Take 2 tablets by mouth at bedtime as needed (sleep).    [provider]  doxylamine, Sleep, (UNISOM) 25 MG tablet Take 25 mg by mouth at bedtime.     [provider]  folic acid (FOLVITE) 1 MG tablet Take 1 tablet (1 mg total) by mouth daily. Patient  taking differently: Take 1 mg by mouth daily after lunch.  09/05/18   Biagio Borg, MD  hydrOXYzine (VISTARIL) 25 MG capsule Take 1 capsule (25 mg total) by mouth every 8 (eight) hours as needed for anxiety. 12/19/19 03/18/20  Pucilowski, Marchia Bond, MD  hyoscyamine (ANASPAZ) 0.125 MG TBDP disintergrating tablet Place 0.125 mg under the tongue every 6 (six) hours as needed ([name] cramps).     [provider]  pantoprazole (PROTONIX) 40 MG tablet Take 1 tablet (40  mg total) by mouth daily. Patient taking differently: Take 40 mg by mouth at bedtime.  04/12/19   Raiford Noble Latif, DO  rosuvastatin (CRESTOR) 5 MG tablet Take 1 tablet (5 mg total) by mouth every Monday, Wednesday, and Friday at 6 PM. 08/30/19 11/28/19  Josue Hector, MD  sertraline (ZOLOFT) 100 MG tablet Take 2 tablets (200 mg total) by mouth at bedtime. 12/19/19 03/18/20  Pucilowski, Marchia Bond, MD  triamcinolone cream (KENALOG) 0.1 % Apply 1 application topically 2 (two) times daily as needed (itching).  03/14/19   [provider]  valACYclovir (VALTREX) 500 MG tablet Take 500 mg by mouth 2 (two) times daily as needed (flare ups).    [provider]  vitamin B-12 (CYANOCOBALAMIN) 100 MCG tablet Take 1 tablet (100 mcg total) by mouth daily. Patient taking differently: Take 100 mcg by mouth daily after lunch.  11/03/18   Biagio Borg, MD    Physical Exam: Vitals:   12/23/19 1521 12/23/19 1622 12/23/19 1700 12/23/19 1800  BP: (!) 148/84 (!) 149/84 138/76 136/77  Pulse: 82 78 71 76  Resp: 12 13 18 12   Temp:      TempSrc:      SpO2: 100% 100% 100% 99%  Weight:      Height:        Constitutional: NAD, calm, comfortable Eyes: PERRL, lids and conjunctivae normal ENMT: Mucous membranes are moist. Posterior pharynx clear of any exudate or lesions.Normal dentition.  Neck: normal, supple, no masses, no thyromegaly Respiratory: clear to auscultation bilaterally, no wheezing, no crackles. Normal respiratory effort. No  accessory muscle use.  Cardiovascular: Regular rate and rhythm, no murmurs / rubs / gallops. No extremity edema. 2+ pedal pulses. No carotid bruits.  Abdomen: no tenderness, no masses palpated. No hepatosplenomegaly. Bowel sounds positive.  Musculoskeletal: no clubbing / cyanosis. No joint deformity upper and lower extremities. Good ROM, no contractures. Normal muscle tone.  Skin: no rashes, lesions, ulcers. No induration Neurologic: CN 2-12 grossly intact. Sensation intact, DTR normal. Strength 5/5 in all 4.  Psychiatric: Normal judgment and insight. Alert and oriented x 3. Normal mood.    Labs on Admission: I have personally reviewed following labs and imaging studies  CBC: Recent Labs  Lab 12/23/19 0948  WBC 6.4  HGB 15.8*  HCT 49.9*  MCV 101.6*  PLT 0000000   Basic Metabolic Panel: Recent Labs  Lab 12/23/19 0948  NA 135  K 3.9  CL 92*  CO2 32  GLUCOSE 121*  BUN <5*  CREATININE 0.85  CALCIUM 8.6*   GFR: Estimated Creatinine Clearance: 49.8 mL/min (by C-G formula based on SCr of 0.85 mg/dL). Liver Function Tests: Recent Labs  Lab 12/23/19 0948  AST 36  ALT 12  ALKPHOS 97  BILITOT 0.5  PROT 5.8*  ALBUMIN 3.3*   Recent Labs  Lab 12/23/19 0948  LIPASE 20   No results for input(s): AMMONIA in the last 168 hours. Coagulation Profile: No results for input(s): INR, PROTIME in the last 168 hours. Cardiac Enzymes: No results for input(s): CKTOTAL, CKMB, CKMBINDEX, TROPONINI in the last 168 hours. BNP (last 3 results) No results for input(s): PROBNP in the last 8760 hours. HbA1C: No results for input(s): HGBA1C in the last 72 hours. CBG: No results for input(s): GLUCAP in the last 168 hours. Lipid Profile: No results for input(s): CHOL, HDL, LDLCALC, TRIG, CHOLHDL, LDLDIRECT in the last 72 hours. Thyroid Function Tests: No results for input(s): TSH, T4TOTAL, FREET4, T3FREE, THYROIDAB in the  last 72 hours. Anemia Panel: No results for input(s): VITAMINB12,  FOLATE, FERRITIN, TIBC, IRON, RETICCTPCT in the last 72 hours. Urine analysis:    Component Value Date/Time   COLORURINE YELLOW 12/23/2019 1622   APPEARANCEUR CLEAR 12/23/2019 1622   LABSPEC 1.044 (H) 12/23/2019 1622   PHURINE 6.0 12/23/2019 1622   GLUCOSEU NEGATIVE 12/23/2019 1622   GLUCOSEU NEGATIVE 12/22/2017 1650   HGBUR NEGATIVE 12/23/2019 1622   BILIRUBINUR NEGATIVE 12/23/2019 1622   BILIRUBINUR neg 02/07/2016 1627   KETONESUR NEGATIVE 12/23/2019 1622   PROTEINUR NEGATIVE 12/23/2019 1622   UROBILINOGEN 0.2 12/22/2017 1650   NITRITE NEGATIVE 12/23/2019 1622   LEUKOCYTESUR NEGATIVE 12/23/2019 1622    Radiological Exams on Admission: CT Chest Wo Contrast  Result Date: 12/23/2019 CLINICAL DATA:  Shortness of breath and hypoxia. Abnormal chest x-ray. EXAM: CT CHEST WITHOUT CONTRAST TECHNIQUE: Multidetector CT imaging of the chest was performed following the standard protocol without IV contrast. COMPARISON:  One-view chest x-ray 01/22/2019 FINDINGS: Cardiovascular: The heart size is normal. Dense coronary artery calcifications are present. Atherosclerotic calcifications are present at the aortic arch and origins the great vessels. Mediastinum/Nodes: No significant mediastinal axillary, or hilar adenopathy is present. Lungs/Pleura: The lung fields are clear. There is no nodule or mass lesion. The nodular density corresponds with the anterior right rib. Bibasilar airspace disease is present, right greater than left. No effusions are present. There is no pneumothorax. Upper Abdomen: IV contrast is noted within the renal collecting systems. Limited imaging of the upper abdomen is otherwise unremarkable. Musculoskeletal: Leftward curvature is present in the upper thoracic spine. Vertebral body heights are maintained. Focal lytic or blastic lesions are present. IMPRESSION: 1. The nodular density on chest x-ray corresponds with the anterior right rib. 2. Bibasilar airspace disease, right greater  than left. While this may represent atelectasis, infection is not excluded. 3. Coronary artery disease. 4. Aortic Atherosclerosis (ICD10-I70.0). Electronically Signed   By: San Morelle M.D.   On: 12/23/2019 19:22   CT ABDOMEN PELVIS W CONTRAST  Result Date: 12/23/2019 CLINICAL DATA:  Abdominal pain, LEFT side abdominal tenderness, vomiting since 0100 hours, low-grade fever, chills, history of ethanol dependence, GERD, hypertension, irritable bowel syndrome, smoker EXAM: CT ABDOMEN AND PELVIS WITH CONTRAST TECHNIQUE: Multidetector CT imaging of the abdomen and pelvis was performed using the standard protocol following bolus administration of intravenous contrast. Sagittal and coronal MPR images reconstructed from axial data set. CONTRAST:  132mL OMNIPAQUE IOHEXOL 300 MG/ML SOLN IV. No oral contrast. COMPARISON:  07/17/2017 FINDINGS: Lower chest: Subsegmental atelectasis dependently at both lung bases greater on RIGHT Hepatobiliary: Fatty infiltration of liver. Tiny nonspecific low-attenuation focus anterior LEFT lobe likely 5 mm cyst unchanged. Dependent density within gallbladder likely reflects cholelithiasis. Mild pericholecystic edema. Dilated CBD 11 mm diameter. Filling defect within distal CBD axial image 38 highly suspicious for choledocholithiasis. Questionable additional subtle intraluminal density on coronal images. Pancreas: Unremarkable Spleen: Normal appearance Adrenals/Urinary Tract: Adrenal glands normal appearance. Cortical thinning of both kidneys without renal mass or hydronephrosis. No urinary tract calcification or ureteral dilatation. Bladder unremarkable. Stomach/Bowel: Appendix surgically absent by history. Bowel wall thickening of transverse colon compatible with colitis. Remainder of colon unremarkable. Small bowel loops normal appearance. Wall thickening of gastric antrum with slight hyperenhancement of mucosa question gastritis; no discrete ulcer identified.  Vascular/Lymphatic: Extensive atherosclerotic calcifications aorta, iliac arteries, femoral arteries, coronary arteries. Aorta normal caliber. No adenopathy. Reproductive: Atrophic uterus and ovaries Other: No free air or free fluid.  No hernia. Musculoskeletal: Osseous demineralization. Orthopedic hardware  proximal RIGHT femur. Prior lumbar fusion L4-S1. Degenerative disc disease changes L3-L4. IMPRESSION: Bowel wall thickening of the transverse colon consistent with colitis: consider inflammatory bowel disease and infection. Edema of gastric antrum with hyperenhancement of the mucosa raising question of gastritis, with no discrete ulcer identified. Cholelithiasis with pericholecystic fluid, cannot exclude acute cholecystitis. Extrahepatic biliary dilatation with at least 1 filling defect in distal CBD highly suspicious for choledocholithiasis; recommend correlation with LFTs and consider MRCP imaging. Fatty infiltration of liver. Bibasilar atelectasis Aortic Atherosclerosis (ICD10-I70.0). Electronically Signed   By: Lavonia Dana M.D.   On: 12/23/2019 14:28   DG Chest Portable 1 View  Result Date: 12/23/2019 CLINICAL DATA:  Cough and shortness of breath. EXAM: PORTABLE CHEST 1 VIEW COMPARISON:  05/04/2019 FINDINGS: Lungs are hyperexpanded. Interstitial markings are diffusely coarsened with chronic features. Mild patchy atelectasis or infiltrate noted at the bases. No pleural effusion. Nodular density noted right upper lobe, superimposed on the anterior end of the first rib. Cardiopericardial silhouette is at upper limits of normal for size. Bones are diffusely demineralized. Telemetry leads overlie the chest. IMPRESSION: 1. Emphysema with bibasilar atelectasis or infiltrate. 2. Nodular density in the right upper lobe may be related to the anterior right first rib and. Follow-up PA and lateral chest x-ray could be used to further assess. Alternatively, CT chest without contrast could be used to further evaluate  as clinically warranted. Electronically Signed   By: Misty Stanley M.D.   On: 12/23/2019 13:43   US Abdomen Limited RUQ  Result Date: 12/23/2019 CLINICAL DATA:  Abdomen pain and vomiting since last night EXAM: ULTRASOUND ABDOMEN LIMITED RIGHT UPPER QUADRANT COMPARISON:  September 24, 2004 FINDINGS: Gallbladder: Gallstones are identified. There is pericholecystic fluid. No sonographic Percell Miller sign is reported by the ultrasound technologist. The gallbladder wall measures 3.4 mm. Common bile duct: Diameter: 10.7 mm Liver: No focal lesion identified. Within normal limits in parenchymal echogenicity. Portal vein is patent on color Doppler imaging with normal direction of blood flow towards the liver. Other: None. IMPRESSION: Gallstones identified in the gallbladder with pericholecystic fluid. No sonographic Percell Miller sign is reported by the ultrasound technologist. The findings are equivocal for acute cholecystitis. Electronically Signed   By: Abelardo Diesel M.D.   On: 12/23/2019 15:56    EKG: Independently reviewed.  Assessment/Plan Principal Problem:   Nausea & vomiting Active Problems:   Chronic pain syndrome   Common bile duct stone   Acute cholecystitis    1. [name] - 1. DDx includes COVID vs choledocholithiasis / cholecystitis 2. Zofran PRN 3. NPO 4. IVF: NS at 50 cc/hr, got 1L bolus in ED 5. Repeat CBC/CMP in AM 6. COVID test pending 2. ? Acute cholecystitis / CBD stone - 1. GI to see in consult in AM 2. Starting Unasyn per their recs 3. Repeat CMP in AM 3. Chronic pain syndrome - 1. Continue home meds  DVT prophylaxis: SCDs - incase ERCP is needed Code Status: Full Family Communication: No family in room Disposition Plan: Home after admit Consults called: GI  Admission status: Admit to inpatient  Severity of Illness: The appropriate patient status for this patient is INPATIENT. Inpatient status is judged to be reasonable and necessary in order to provide the required intensity of  service to ensure the patient's safety. The patient's presenting symptoms, physical exam findings, and initial radiographic and laboratory data in the context of their chronic comorbidities is felt to place them at high risk for further clinical deterioration. Furthermore, it is not anticipated  that the patient will be medically stable for discharge from the hospital within 2 midnights of admission. The following factors support the patient status of inpatient.   IP status for [name], apparent CBD stone seen on imaging.   * I certify that at the point of admission it is my clinical judgment that the patient will require inpatient hospital care spanning beyond 2 midnights from the point of admission due to high intensity of service, high risk for further deterioration and high frequency of surveillance required.*    Brailen Macneal M. DO [facility]  How to contact the [facility] Attending or Consulting provider [place] or covering provider during after hours [place], for this patient?  1. Check the care team in [facility] and look for a) attending/consulting [facility] provider listed and b) the [facility] team listed 2. Log into [email or web address]  Amion Physician Scheduling and messaging for groups and whole hospitals  On call and physician scheduling software for group practices, residents, hospitalists and other medical providers for call, clinic, rotation and shift schedules. On[employer] is a hospital-wide system for scheduling doctors and paging doctors on call. EasyPlot is for scientific plotting and data analysis.  [email or web address]  and use [place]'s universal password to access. If you do not have the password, please contact the hospital operator.  3. Locate the [facility] provider you are looking for under [facility] and page to a number that you can be directly reached. 4. If you still have difficulty reaching the provider, please page the [facility] (Director on Call) for the Hospitalists listed on amion for  assistance.  12/23/2019, 8:40 PM

## 2019-12-23 NOTE — Consult Note (Signed)
CC: [name], abd pain  Requesting provider: Delia Heady, PA-C  HPI: Denise King is an 68 y.o. female with hx of chronic pain syndrome, [name], chronic diarrhea dating back several months, whom presents to the ED with nonradiating sharp MEG pain and n/v for the past 24hrs. This is new. Denies changes in her baseline bowel habits. Denies f/c.   Past Medical History:  Diagnosis Date  . Acute cystitis   . Acute respiratory failure ([place])   . Allergy    as a child grew out of them  . Anemia    pernicious anemia  . Anxiety   . Arthritis   . Back pain   . Blood transfusion   . CAP (community acquired pneumonia)   . Chronic pain syndrome   . Depression   . Diverticulosis of colon   . Dysthymia   . Esophagitis   . EtOH dependence ([place])   . Gastritis   . GERD (gastroesophageal reflux disease)   . H/O chest pain Dec. 2013   no work up done  . Hx of colonic polyps   . Hypertension   . Irritable bowel syndrome   . Osteopenia   . Osteoporosis   . Pneumonia 2019  . Polypharmacy   . Reflux esophagitis   . Right sided sciatica 12/22/2017  . Tobacco use disorder   . Unspecified chronic bronchitis ([place])   . Vertigo   . Vitamin B12 deficiency   . Wears glasses     Past Surgical History:  Procedure Laterality Date  . APPENDECTOMY    . BACK SURGERY  10-09   Dr. Patrice Paradise  . CARPAL TUNNEL RELEASE Right 08/14/2014   Procedure: RIGHT CARPAL TUNNEL RELEASE AND INJECT LEFT THUMB;  Surgeon: Daryll Brod, MD;  Location: [facility];  Service: Orthopedics;  Laterality: Right;  . COLONOSCOPY    . INTRAMEDULLARY (IM) NAIL INTERTROCHANTERIC Right 10/01/2018   Procedure: INTRAMEDULLARY (IM) NAIL INTERTROCHANTRIC;  Surgeon: Thornton Park, MD;  Location: [facility];  Service: Orthopedics;  Laterality: Right;  . LAPAROSCOPY N/A 02/21/2013   Procedure: LAPAROSCOPY OPERATIVE;  Surgeon: Margarette Asal, MD;  Location: [facility];  Service: Gynecology;  Laterality: N/A;  REQUESTING 5MM SCOPE  WITH CAMERA  . LEFT HEART CATH AND CORONARY ANGIOGRAPHY N/A 05/11/2019   Procedure: LEFT HEART CATH AND CORONARY ANGIOGRAPHY;  Surgeon: Sherren Mocha, MD;  Location: [facility];  Service: Cardiovascular;  Laterality: N/A;  . TONSILLECTOMY    . TUBAL LIGATION    . UPPER GASTROINTESTINAL ENDOSCOPY      Family History  Problem Relation Age of Onset  . Colon cancer Father   . Prostate cancer Father   . Heart disease Mother        prev MVR, also has DJD  . Colon cancer Brother   . Skin cancer Sister   . Alcohol abuse Daughter   . Bipolar disorder Daughter   . Drug abuse Daughter   . Esophageal cancer Neg Hx   . Rectal cancer Neg Hx   . Stomach cancer Neg Hx     Social:  reports that she has been smoking cigarettes. She has a 30.00 pack-year smoking history. She has never used smokeless tobacco. She reports current alcohol use of about 3.0 standard drinks of alcohol per week. She reports that she does not use drugs.  Allergies:  Allergies  Allergen Reactions  . Atorvastatin Nausea And Vomiting  . Levofloxacin Other (See Comments)    Reaction: achilles tendon pain  .  Omnicef [Cefdinir] Diarrhea    Uncontrollable diarrhea  . Trazodone And Nefazodone Other (See Comments)    "Felt like I was going to faint"  . Sulfa Antibiotics Rash  . Sulfonamide Derivatives Rash    Medications: I have reviewed the patient's current medications.  Results for orders placed or performed during the hospital encounter of 12/23/19 (from the past 48 hour(s))  Lipase, blood     Status: None   Collection Time: 12/23/19  9:48 AM  Result Value Ref Range   Lipase 20 11 - 51 U/L    Comment: Performed at [facility], [address] [place], [place] [address]  Comprehensive metabolic panel     Status: Abnormal   Collection Time: 12/23/19  9:48 AM  Result Value Ref Range   Sodium 135 135 - 145 mmol/L   Potassium 3.9 3.5 - 5.1 mmol/L   Chloride 92 (L) 98 - 111 mmol/L   CO2 32 22 - 32  mmol/L   Glucose, Bld 121 (H) 70 - 99 mg/dL   BUN <5 (L) 8 - 23 mg/dL   Creatinine, Ser 0.85 0.44 - 1.00 mg/dL   Calcium 8.6 (L) 8.9 - 10.3 mg/dL   Total Protein 5.8 (L) 6.5 - 8.1 g/dL   Albumin 3.3 (L) 3.5 - 5.0 g/dL   AST 36 15 - 41 U/L   ALT 12 0 - 44 U/L   Alkaline Phosphatase 97 38 - 126 U/L   Total Bilirubin 0.5 0.3 - 1.2 mg/dL   GFR calc non Af Amer >60 >60 mL/min   GFR calc Af Amer >60 >60 mL/min   Anion gap 11 5 - 15    Comment: Performed at [facility], [place] [address], [place], [place] [address]  CBC     Status: Abnormal   Collection Time: 12/23/19  9:48 AM  Result Value Ref Range   WBC 6.4 4.0 - 10.5 K/uL   RBC 4.91 3.87 - 5.11 MIL/uL   Hemoglobin 15.8 (H) 12.0 - 15.0 g/dL   HCT 49.9 (H) 36.0 - 46.0 %   MCV 101.6 (H) 80.0 - 100.0 fL   MCH 32.2 26.0 - 34.0 pg   MCHC 31.7 30.0 - 36.0 g/dL   RDW 13.7 11.5 - 15.5 %   Platelets 181 150 - 400 K/uL   nRBC 0.0 0.0 - 0.2 %    Comment: Performed at [facility], [place] [address] [place], [place] [address]  POC SARS Coronavirus 2 Ag-ED - Nasal Swab (BD Veritor Kit)     Status: None   Collection Time: 12/23/19 12:52 PM  Result Value Ref Range   SARS Coronavirus 2 Ag NEGATIVE NEGATIVE    Comment: (NOTE) SARS-CoV-2 antigen NOT DETECTED.  Negative results are presumptive.  Negative results do not preclude SARS-CoV-2 infection and should not be used as the sole basis for treatment or other patient management decisions, including infection  control decisions, particularly in the presence of clinical signs and  symptoms consistent with COVID-19, or in those who have been in contact with the virus.  Negative results must be combined with clinical observations, patient history, and epidemiological information. The expected result is Negative. Fact Sheet for Patients: PodPark.tn Fact Sheet for Healthcare Providers: GiftContent.is This test is not yet approved or  cleared by the [place] FDA and  has been authorized for detection and/or diagnosis of SARS-CoV-2 by FDA under an Emergency Use Authorization (EUA).  This EUA will remain in effect (meaning  this test can be used) for the duration of  the COVID-19 de claration under Section 564(b)(1) of the Act, 21 U.S.C. section 360bbb-3(b)(1), unless the authorization is terminated or revoked sooner.   SARS CORONAVIRUS 2 (TAT 6-24 HRS) Nasopharyngeal Nasopharyngeal Swab     Status: None   Collection Time: 12/23/19  3:21 PM   Specimen: Nasopharyngeal Swab  Result Value Ref Range   SARS Coronavirus 2 NEGATIVE NEGATIVE    Comment: (NOTE) SARS-CoV-2 target nucleic acids are NOT DETECTED. The SARS-CoV-2 RNA is generally detectable in upper and lower respiratory specimens during the acute phase of infection. Negative results do not preclude SARS-CoV-2 infection, do not rule out co-infections with other pathogens, and should not be used as the sole basis for treatment or other patient management decisions. Negative results must be combined with clinical observations, patient history, and epidemiological information. The expected result is Negative. Fact Sheet for Patients: SugarRoll.be Fact Sheet for Healthcare Providers: https://www.woods-mathews.com/ This test is not yet approved or cleared by the [place] FDA and  has been authorized for detection and/or diagnosis of SARS-CoV-2 by FDA under an Emergency Use Authorization (EUA). This EUA will remain  in effect (meaning this test can be used) for the duration of the COVID-19 declaration under Section 56 4(b)(1) of the Act, 21 U.S.C. section 360bbb-3(b)(1), unless the authorization is terminated or revoked sooner. Performed at [facility], [place] [address] [place], [place] [address]   Urinalysis, Routine w reflex microscopic     Status: Abnormal   Collection Time: 12/23/19  4:22 PM  Result Value  Ref Range   Color, Urine YELLOW YELLOW   APPearance CLEAR CLEAR   Specific Gravity, Urine 1.044 (H) 1.005 - 1.030   pH 6.0 5.0 - 8.0   Glucose, UA NEGATIVE NEGATIVE mg/dL   Hgb urine dipstick NEGATIVE NEGATIVE   Bilirubin Urine NEGATIVE NEGATIVE   Ketones, ur NEGATIVE NEGATIVE mg/dL   Protein, ur NEGATIVE NEGATIVE mg/dL   Nitrite NEGATIVE NEGATIVE   Leukocytes,Ua NEGATIVE NEGATIVE   RBC / HPF 0-5 0 - 5 RBC/hpf   WBC, UA 0-5 0 - 5 WBC/hpf   Bacteria, UA NONE SEEN NONE SEEN   Squamous Epithelial / LPF 0-5 0 - 5   Mucus PRESENT     Comment: Performed at [facility], [place] [address], [place], [place] [address]  HIV Antibody (routine testing w rflx)     Status: None   Collection Time: 12/23/19  8:25 PM  Result Value Ref Range   HIV Screen 4th Generation wRfx NON REACTIVE NON REACTIVE    Comment: Performed at [facility] [address], [place],  [address]    CT Chest Wo Contrast  Result Date: 12/23/2019 CLINICAL DATA:  Shortness of breath and hypoxia. Abnormal chest x-ray. EXAM: CT CHEST WITHOUT CONTRAST TECHNIQUE: Multidetector CT imaging of the chest was performed following the standard protocol without IV contrast. COMPARISON:  One-view chest x-ray 01/22/2019 FINDINGS: Cardiovascular: The heart size is normal. Dense coronary artery calcifications are present. Atherosclerotic calcifications are present at the aortic arch and origins the great vessels. Mediastinum/Nodes: No significant mediastinal axillary, or hilar adenopathy is present. Lungs/Pleura: The lung fields are clear. There is no nodule or mass lesion. The nodular density corresponds with the anterior right rib. Bibasilar airspace disease is present, right greater than left. No effusions are present. There is no pneumothorax. Upper Abdomen: IV contrast is noted within the renal collecting systems. Limited imaging of the upper  abdomen is otherwise unremarkable. Musculoskeletal: Leftward curvature is present in  the upper thoracic spine. Vertebral body heights are maintained. Focal lytic or blastic lesions are present. IMPRESSION: 1. The nodular density on chest x-ray corresponds with the anterior right rib. 2. Bibasilar airspace disease, right greater than left. While this may represent atelectasis, infection is not excluded. 3. Coronary artery disease. 4. Aortic Atherosclerosis (ICD10-I70.0). Electronically Signed   By: San Morelle M.D.   On: 12/23/2019 19:22   CT ABDOMEN PELVIS W CONTRAST  Result Date: 12/23/2019 CLINICAL DATA:  Abdominal pain, LEFT side abdominal tenderness, vomiting since 0100 hours, low-grade fever, chills, history of ethanol dependence, GERD, hypertension, irritable bowel syndrome, smoker EXAM: CT ABDOMEN AND PELVIS WITH CONTRAST TECHNIQUE: Multidetector CT imaging of the abdomen and pelvis was performed using the standard protocol following bolus administration of intravenous contrast. Sagittal and coronal MPR images reconstructed from axial data set. CONTRAST:  160m OMNIPAQUE IOHEXOL 300 MG/ML SOLN IV. No oral contrast. COMPARISON:  07/17/2017 FINDINGS: Lower chest: Subsegmental atelectasis dependently at both lung bases greater on RIGHT Hepatobiliary: Fatty infiltration of liver. Tiny nonspecific low-attenuation focus anterior LEFT lobe likely 5 mm cyst unchanged. Dependent density within gallbladder likely reflects cholelithiasis. Mild pericholecystic edema. Dilated CBD 11 mm diameter. Filling defect within distal CBD axial image 38 highly suspicious for choledocholithiasis. Questionable additional subtle intraluminal density on coronal images. Pancreas: Unremarkable Spleen: Normal appearance Adrenals/Urinary Tract: Adrenal glands normal appearance. Cortical thinning of both kidneys without renal mass or hydronephrosis. No urinary tract calcification or ureteral dilatation. Bladder unremarkable. Stomach/Bowel: Appendix surgically absent by history. Bowel wall thickening of  transverse colon compatible with colitis. Remainder of colon unremarkable. Small bowel loops normal appearance. Wall thickening of gastric antrum with slight hyperenhancement of mucosa question gastritis; no discrete ulcer identified. Vascular/Lymphatic: Extensive atherosclerotic calcifications aorta, iliac arteries, femoral arteries, coronary arteries. Aorta normal caliber. No adenopathy. Reproductive: Atrophic uterus and ovaries Other: No free air or free fluid.  No hernia. Musculoskeletal: Osseous demineralization. Orthopedic hardware proximal RIGHT femur. Prior lumbar fusion L4-S1. Degenerative disc disease changes L3-L4. IMPRESSION: Bowel wall thickening of the transverse colon consistent with colitis: consider inflammatory bowel disease and infection. Edema of gastric antrum with hyperenhancement of the mucosa raising question of gastritis, with no discrete ulcer identified. Cholelithiasis with pericholecystic fluid, cannot exclude acute cholecystitis. Extrahepatic biliary dilatation with at least 1 filling defect in distal CBD highly suspicious for choledocholithiasis; recommend correlation with LFTs and consider MRCP imaging. Fatty infiltration of liver. Bibasilar atelectasis Aortic Atherosclerosis (ICD10-I70.0). Electronically Signed   By: MLavonia DanaM.D.   On: 12/23/2019 14:28   DG Chest Portable 1 View  Result Date: 12/23/2019 CLINICAL DATA:  Cough and shortness of breath. EXAM: PORTABLE CHEST 1 VIEW COMPARISON:  05/04/2019 FINDINGS: Lungs are hyperexpanded. Interstitial markings are diffusely coarsened with chronic features. Mild patchy atelectasis or infiltrate noted at the bases. No pleural effusion. Nodular density noted right upper lobe, superimposed on the anterior end of the first rib. Cardiopericardial silhouette is at upper limits of normal for size. Bones are diffusely demineralized. Telemetry leads overlie the chest. IMPRESSION: 1. Emphysema with bibasilar atelectasis or infiltrate. 2.  Nodular density in the right upper lobe may be related to the anterior right first rib and. Follow-up PA and lateral chest x-ray could be used to further assess. Alternatively, CT chest without contrast could be used to further evaluate as clinically warranted. Electronically Signed   By: EMisty StanleyM.D.   On: 12/23/2019 13:43   UKoreaAbdomen  Limited RUQ  Result Date: 12/23/2019 CLINICAL DATA:  Abdomen pain and vomiting since last night EXAM: ULTRASOUND ABDOMEN LIMITED RIGHT UPPER QUADRANT COMPARISON:  September 24, 2004 FINDINGS: Gallbladder: Gallstones are identified. There is pericholecystic fluid. No sonographic Percell Miller sign is reported by the ultrasound technologist. The gallbladder wall measures 3.4 mm. Common bile duct: Diameter: 10.7 mm Liver: No focal lesion identified. Within normal limits in parenchymal echogenicity. Portal vein is patent on color Doppler imaging with normal direction of blood flow towards the liver. Other: None. IMPRESSION: Gallstones identified in the gallbladder with pericholecystic fluid. No sonographic Percell Miller sign is reported by the ultrasound technologist. The findings are equivocal for acute cholecystitis. Electronically Signed   By: Abelardo Diesel M.D.   On: 12/23/2019 15:56    ROS - all of the below systems have been reviewed with the patient and positives are indicated with bold text General: chills, fever or night sweats Eyes: blurry vision or double vision ENT: epistaxis or sore throat Allergy/Immunology: itchy/watery eyes or nasal congestion Hematologic/Lymphatic: bleeding problems, blood clots or swollen lymph nodes Endocrine: temperature intolerance or unexpected weight changes Breast: new or changing breast lumps or nipple discharge Resp: cough, shortness of breath, or wheezing CV: chest pain or dyspnea on exertion GI: as per HPI GU: dysuria, trouble voiding, or hematuria MSK: joint pain or joint stiffness Neuro: TIA or stroke symptoms Derm: pruritus  and skin lesion changes Psych: anxiety and depression  PE Blood pressure 131/68, pulse 87, temperature 99.1 F (37.3 C), temperature source Oral, resp. rate (!) 21, height '5\' 3"'  (1.6 m), weight 48.5 kg, SpO2 92 %. Constitutional: NAD; conversant; no deformities Eyes: Moist conjunctiva; no lid lag; anicteric; PERRL Neck: Trachea midline; no thyromegaly Lungs: Normal respiratory effort; no tactile fremitus CV: RRR; no palpable thrills; no pitting edema GI: Abd soft, currently not signficantly tender; no palpable hepatosplenomegaly MSK: Normal ROM; no clubbing/cyanosis Psychiatric: Appropriate affect; alert and oriented x3 Lymphatic: No palpable cervical or axillary lymphadenopathy  Results for orders placed or performed during the hospital encounter of 12/23/19 (from the past 48 hour(s))  Lipase, blood     Status: None   Collection Time: 12/23/19  9:48 AM  Result Value Ref Range   Lipase 20 11 - 51 U/L    Comment: Performed at [facility], [address], [place], [place] [address]  Comprehensive metabolic panel     Status: Abnormal   Collection Time: 12/23/19  9:48 AM  Result Value Ref Range   Sodium 135 135 - 145 mmol/L   Potassium 3.9 3.5 - 5.1 mmol/L   Chloride 92 (L) 98 - 111 mmol/L   CO2 32 22 - 32 mmol/L   Glucose, Bld 121 (H) 70 - 99 mg/dL   BUN <5 (L) 8 - 23 mg/dL   Creatinine, Ser 0.85 0.44 - 1.00 mg/dL   Calcium 8.6 (L) 8.9 - 10.3 mg/dL   Total Protein 5.8 (L) 6.5 - 8.1 g/dL   Albumin 3.3 (L) 3.5 - 5.0 g/dL   AST 36 15 - 41 U/L   ALT 12 0 - 44 U/L   Alkaline Phosphatase 97 38 - 126 U/L   Total Bilirubin 0.5 0.3 - 1.2 mg/dL   GFR calc non Af Amer >60 >60 mL/min   GFR calc Af Amer >60 >60 mL/min   Anion gap 11 5 - 15    Comment: Performed at [facility], [place] [address] [place], [place] [address]  CBC     Status:  Abnormal   Collection Time: 12/23/19  9:48 AM  Result Value Ref Range   WBC 6.4 4.0 - 10.5 K/uL   RBC 4.91 3.87 - 5.11 MIL/uL    Hemoglobin 15.8 (H) 12.0 - 15.0 g/dL   HCT 49.9 (H) 36.0 - 46.0 %   MCV 101.6 (H) 80.0 - 100.0 fL   MCH 32.2 26.0 - 34.0 pg   MCHC 31.7 30.0 - 36.0 g/dL   RDW 13.7 11.5 - 15.5 %   Platelets 181 150 - 400 K/uL   nRBC 0.0 0.0 - 0.2 %    Comment: Performed at [facility], [place] [address], [place], [place] [address]  POC SARS Coronavirus 2 Ag-ED - Nasal Swab (BD Veritor Kit)     Status: None   Collection Time: 12/23/19 12:52 PM  Result Value Ref Range   SARS Coronavirus 2 Ag NEGATIVE NEGATIVE    Comment: (NOTE) SARS-CoV-2 antigen NOT DETECTED.  Negative results are presumptive.  Negative results do not preclude SARS-CoV-2 infection and should not be used as the sole basis for treatment or other patient management decisions, including infection  control decisions, particularly in the presence of clinical signs and  symptoms consistent with COVID-19, or in those who have been in contact with the virus.  Negative results must be combined with clinical observations, patient history, and epidemiological information. The expected result is Negative. Fact Sheet for Patients: PodPark.tn Fact Sheet for Healthcare Providers: GiftContent.is This test is not yet approved or cleared by the [place] FDA and  has been authorized for detection and/or diagnosis of SARS-CoV-2 by FDA under an Emergency Use Authorization (EUA).  This EUA will remain in effect (meaning this test can be used) for the duration of  the COVID-19 de claration under Section 564(b)(1) of the Act, 21 U.S.C. section 360bbb-3(b)(1), unless the authorization is terminated or revoked sooner.   SARS CORONAVIRUS 2 (TAT 6-24 HRS) Nasopharyngeal Nasopharyngeal Swab     Status: None   Collection Time: 12/23/19  3:21 PM   Specimen: Nasopharyngeal Swab  Result Value Ref Range   SARS Coronavirus 2 NEGATIVE NEGATIVE    Comment: (NOTE) SARS-CoV-2 target nucleic acids are  NOT DETECTED. The SARS-CoV-2 RNA is generally detectable in upper and lower respiratory specimens during the acute phase of infection. Negative results do not preclude SARS-CoV-2 infection, do not rule out co-infections with other pathogens, and should not be used as the sole basis for treatment or other patient management decisions. Negative results must be combined with clinical observations, patient history, and epidemiological information. The expected result is Negative. Fact Sheet for Patients: SugarRoll.be Fact Sheet for Healthcare Providers: https://www.woods-mathews.com/ This test is not yet approved or cleared by the [place] FDA and  has been authorized for detection and/or diagnosis of SARS-CoV-2 by FDA under an Emergency Use Authorization (EUA). This EUA will remain  in effect (meaning this test can be used) for the duration of the COVID-19 declaration under Section 56 4(b)(1) of the Act, 21 U.S.C. section 360bbb-3(b)(1), unless the authorization is terminated or revoked sooner. Performed at [facility], [place] [address], [place], [place] [address]   Urinalysis, Routine w reflex microscopic     Status: Abnormal   Collection Time: 12/23/19  4:22 PM  Result Value Ref Range   Color, Urine YELLOW YELLOW   APPearance CLEAR CLEAR   Specific Gravity, Urine 1.044 (H) 1.005 - 1.030   pH 6.0 5.0 - 8.0   Glucose, UA NEGATIVE NEGATIVE mg/dL  Hgb urine dipstick NEGATIVE NEGATIVE   Bilirubin Urine NEGATIVE NEGATIVE   Ketones, ur NEGATIVE NEGATIVE mg/dL   Protein, ur NEGATIVE NEGATIVE mg/dL   Nitrite NEGATIVE NEGATIVE   Leukocytes,Ua NEGATIVE NEGATIVE   RBC / HPF 0-5 0 - 5 RBC/hpf   WBC, UA 0-5 0 - 5 WBC/hpf   Bacteria, UA NONE SEEN NONE SEEN   Squamous Epithelial / LPF 0-5 0 - 5   Mucus PRESENT     Comment: Performed at [facility], [place] [address], [place], [place] [address]  HIV Antibody (routine testing w rflx)      Status: None   Collection Time: 12/23/19  8:25 PM  Result Value Ref Range   HIV Screen 4th Generation wRfx NON REACTIVE NON REACTIVE    Comment: Performed at [facility] [address], [place], [place] [address]    CT Chest Wo Contrast  Result Date: 12/23/2019 CLINICAL DATA:  Shortness of breath and hypoxia. Abnormal chest x-ray. EXAM: CT CHEST WITHOUT CONTRAST TECHNIQUE: Multidetector CT imaging of the chest was performed following the standard protocol without IV contrast. COMPARISON:  One-view chest x-ray 01/22/2019 FINDINGS: Cardiovascular: The heart size is normal. Dense coronary artery calcifications are present. Atherosclerotic calcifications are present at the aortic arch and origins the great vessels. Mediastinum/Nodes: No significant mediastinal axillary, or hilar adenopathy is present. Lungs/Pleura: The lung fields are clear. There is no nodule or mass lesion. The nodular density corresponds with the anterior right rib. Bibasilar airspace disease is present, right greater than left. No effusions are present. There is no pneumothorax. Upper Abdomen: IV contrast is noted within the renal collecting systems. Limited imaging of the upper abdomen is otherwise unremarkable. Musculoskeletal: Leftward curvature is present in the upper thoracic spine. Vertebral body heights are maintained. Focal lytic or blastic lesions are present. IMPRESSION: 1. The nodular density on chest x-ray corresponds with the anterior right rib. 2. Bibasilar airspace disease, right greater than left. While this may represent atelectasis, infection is not excluded. 3. Coronary artery disease. 4. Aortic Atherosclerosis (ICD10-I70.0). Electronically Signed   By: San Morelle M.D.   On: 12/23/2019 19:22   CT ABDOMEN PELVIS W CONTRAST  Result Date: 12/23/2019 CLINICAL DATA:  Abdominal pain, LEFT side abdominal tenderness, vomiting since 0100 hours, low-grade fever, chills, history of ethanol dependence, GERD,  hypertension, irritable bowel syndrome, smoker EXAM: CT ABDOMEN AND PELVIS WITH CONTRAST TECHNIQUE: Multidetector CT imaging of the abdomen and pelvis was performed using the standard protocol following bolus administration of intravenous contrast. Sagittal and coronal MPR images reconstructed from axial data set. CONTRAST:  164m OMNIPAQUE IOHEXOL 300 MG/ML SOLN IV. No oral contrast. COMPARISON:  07/17/2017 FINDINGS: Lower chest: Subsegmental atelectasis dependently at both lung bases greater on RIGHT Hepatobiliary: Fatty infiltration of liver. Tiny nonspecific low-attenuation focus anterior LEFT lobe likely 5 mm cyst unchanged. Dependent density within gallbladder likely reflects cholelithiasis. Mild pericholecystic edema. Dilated CBD 11 mm diameter. Filling defect within distal CBD axial image 38 highly suspicious for choledocholithiasis. Questionable additional subtle intraluminal density on coronal images. Pancreas: Unremarkable Spleen: Normal appearance Adrenals/Urinary Tract: Adrenal glands normal appearance. Cortical thinning of both kidneys without renal mass or hydronephrosis. No urinary tract calcification or ureteral dilatation. Bladder unremarkable. Stomach/Bowel: Appendix surgically absent by history. Bowel wall thickening of transverse colon compatible with colitis. Remainder of colon unremarkable. Small bowel loops normal appearance. Wall thickening of gastric antrum with slight hyperenhancement of mucosa question gastritis; no discrete ulcer identified. Vascular/Lymphatic: Extensive atherosclerotic calcifications aorta, iliac  arteries, femoral arteries, coronary arteries. Aorta normal caliber. No adenopathy. Reproductive: Atrophic uterus and ovaries Other: No free air or free fluid.  No hernia. Musculoskeletal: Osseous demineralization. Orthopedic hardware proximal RIGHT femur. Prior lumbar fusion L4-S1. Degenerative disc disease changes L3-L4. IMPRESSION: Bowel wall thickening of the transverse  colon consistent with colitis: consider inflammatory bowel disease and infection. Edema of gastric antrum with hyperenhancement of the mucosa raising question of gastritis, with no discrete ulcer identified. Cholelithiasis with pericholecystic fluid, cannot exclude acute cholecystitis. Extrahepatic biliary dilatation with at least 1 filling defect in distal CBD highly suspicious for choledocholithiasis; recommend correlation with LFTs and consider MRCP imaging. Fatty infiltration of liver. Bibasilar atelectasis Aortic Atherosclerosis (ICD10-I70.0). Electronically Signed   By: Lavonia Dana M.D.   On: 12/23/2019 14:28   DG Chest Portable 1 View  Result Date: 12/23/2019 CLINICAL DATA:  Cough and shortness of breath. EXAM: PORTABLE CHEST 1 VIEW COMPARISON:  05/04/2019 FINDINGS: Lungs are hyperexpanded. Interstitial markings are diffusely coarsened with chronic features. Mild patchy atelectasis or infiltrate noted at the bases. No pleural effusion. Nodular density noted right upper lobe, superimposed on the anterior end of the first rib. Cardiopericardial silhouette is at upper limits of normal for size. Bones are diffusely demineralized. Telemetry leads overlie the chest. IMPRESSION: 1. Emphysema with bibasilar atelectasis or infiltrate. 2. Nodular density in the right upper lobe may be related to the anterior right first rib and. Follow-up PA and lateral chest x-ray could be used to further assess. Alternatively, CT chest without contrast could be used to further evaluate as clinically warranted. Electronically Signed   By: Misty Stanley M.D.   On: 12/23/2019 13:43   US Abdomen Limited RUQ  Result Date: 12/23/2019 CLINICAL DATA:  Abdomen pain and vomiting since last night EXAM: ULTRASOUND ABDOMEN LIMITED RIGHT UPPER QUADRANT COMPARISON:  September 24, 2004 FINDINGS: Gallbladder: Gallstones are identified. There is pericholecystic fluid. No sonographic Percell Miller sign is reported by the ultrasound technologist. The  gallbladder wall measures 3.4 mm. Common bile duct: Diameter: 10.7 mm Liver: No focal lesion identified. Within normal limits in parenchymal echogenicity. Portal vein is patent on color Doppler imaging with normal direction of blood flow towards the liver. Other: None. IMPRESSION: Gallstones identified in the gallbladder with pericholecystic fluid. No sonographic Percell Miller sign is reported by the ultrasound technologist. The findings are equivocal for acute cholecystitis. Electronically Signed   By: Abelardo Diesel M.D.   On: 12/23/2019 15:56    A/P: Denise King is an 68 y.o. female with hx of chronic pain syndrome, [name], chronic diarrhea and now probable CBD stone in addition to 2ndary findings suggestive of possible cholecystitis  -NPO, MIVF -IV Zosyn -GI consultation -Repeat LFTs in AM -We will follow with you  Sharon Mt. Dema Severin, M.D. [facility] Use [email or web address] to contact on call provider

## 2019-12-23 NOTE — ED Notes (Signed)
This RN attempted to call report to floor. Was told that floor nurse was just assigned, was looking at chart and would call back

## 2019-12-23 NOTE — ED Triage Notes (Signed)
C/o vomiting since 1am.  States EMS came out and she had a low grade fever.  Also reports chills.  Reports her usual chronic back pain but states she has been unable to get pain under control since yesterday.

## 2019-12-23 NOTE — ED Notes (Signed)
ED TO INPATIENT HANDOFF REPORT  ED Nurse Name and Phone #: Santiago Graf, [phone]  S Name/Age/Gender Denise King 68 y.o. female Room/Bed: 023C/023C  Code Status   Code Status: Full Code  Home/SNF/Other Home Patient oriented to: self, place, time and situation Is this baseline? Yes   Triage Complete: Triage complete  Chief Complaint Common bile duct stone [K80.50]  Triage Note C/o vomiting since 1am.  States EMS came out and she had a low grade fever.  Also reports chills.  Reports her usual chronic back pain but states she has been unable to get pain under control since yesterday.    Allergies Allergies  Allergen Reactions  . Atorvastatin Nausea And Vomiting  . Levofloxacin Other (See Comments)    Reaction: achilles tendon pain  . Omnicef [Cefdinir] Diarrhea    Uncontrollable diarrhea  . Trazodone And Nefazodone Other (See Comments)    "Felt like I was going to faint"  . Sulfa Antibiotics Rash  . Sulfonamide Derivatives Rash    Level of Care/Admitting Diagnosis ED Disposition    ED Disposition Condition Comment   Admit  Hospital Area: [facility] [[id number]]  Level of Care: Med-Surg [16]  Covid Evaluation: Confirmed COVID Negative  Diagnosis: Common bile duct stone [628366]  Admitting Physician: Etta Quill [[id number]]  Attending Physician: Etta Quill [[id number]]  Estimated length of stay: past midnight tomorrow  Certification:: I certify this patient will need inpatient services for at least 2 midnights       B Medical/Surgery History Past Medical History:  Diagnosis Date  . Acute cystitis   . Acute respiratory failure ([place])   . Allergy    as a child grew out of them  . Anemia    pernicious anemia  . Anxiety   . Arthritis   . Back pain   . Blood transfusion   . CAP (community acquired pneumonia)   . Chronic pain syndrome   . Depression   . Diverticulosis of colon   . Dysthymia   . Esophagitis   . EtOH dependence ([place])   .  Gastritis   . GERD (gastroesophageal reflux disease)   . H/O chest pain Dec. 2013   no work up done  . Hx of colonic polyps   . Hypertension   . Irritable bowel syndrome   . Osteopenia   . Osteoporosis   . Pneumonia 2019  . Polypharmacy   . Reflux esophagitis   . Right sided sciatica 12/22/2017  . Tobacco use disorder   . Unspecified chronic bronchitis ([place])   . Vertigo   . Vitamin B12 deficiency   . Wears glasses    Past Surgical History:  Procedure Laterality Date  . APPENDECTOMY    . BACK SURGERY  10-09   Dr. Patrice Paradise  . CARPAL TUNNEL RELEASE Right 08/14/2014   Procedure: RIGHT CARPAL TUNNEL RELEASE AND INJECT LEFT THUMB;  Surgeon: Daryll Brod, MD;  Location: [facility];  Service: Orthopedics;  Laterality: Right;  . COLONOSCOPY    . INTRAMEDULLARY (IM) NAIL INTERTROCHANTERIC Right 10/01/2018   Procedure: INTRAMEDULLARY (IM) NAIL INTERTROCHANTRIC;  Surgeon: Thornton Park, MD;  Location: [facility];  Service: Orthopedics;  Laterality: Right;  . LAPAROSCOPY N/A 02/21/2013   Procedure: LAPAROSCOPY OPERATIVE;  Surgeon: Margarette Asal, MD;  Location: [facility];  Service: Gynecology;  Laterality: N/A;  REQUESTING 5MM SCOPE WITH CAMERA  . LEFT HEART CATH AND CORONARY ANGIOGRAPHY N/A 05/11/2019   Procedure: LEFT HEART CATH AND CORONARY ANGIOGRAPHY;  Surgeon: Sherren Mocha, MD;  Location: [facility];  Service: Cardiovascular;  Laterality: N/A;  . TONSILLECTOMY    . TUBAL LIGATION    . UPPER GASTROINTESTINAL ENDOSCOPY       A IV Location/Drains/Wounds Patient Lines/Drains/Airways Status   Active Line/Drains/Airways    Name:   Placement date:   Placement time:   Site:   Days:   Peripheral IV 12/23/19 Right Antecubital   12/23/19    1225    Antecubital   less than 1   Incision - 3 Ports Other (Comment) Right Mid Left   10/01/18    1422     448          Intake/Output Last 24 hours  Intake/Output Summary (Last 24 hours) at 12/23/2019 2146 Last data filed at  12/23/2019 1731 Gross per 24 hour  Intake 1000 ml  Output --  Net 1000 ml    Labs/Imaging Results for orders placed or performed during the hospital encounter of 12/23/19 (from the past 48 hour(s))  Lipase, blood     Status: None   Collection Time: 12/23/19  9:48 AM  Result Value Ref Range   Lipase 20 11 - 51 U/L    Comment: Performed at [facility], [address] [place], [place] [address]  Comprehensive metabolic panel     Status: Abnormal   Collection Time: 12/23/19  9:48 AM  Result Value Ref Range   Sodium 135 135 - 145 mmol/L   Potassium 3.9 3.5 - 5.1 mmol/L   Chloride 92 (L) 98 - 111 mmol/L   CO2 32 22 - 32 mmol/L   Glucose, Bld 121 (H) 70 - 99 mg/dL   BUN <5 (L) 8 - 23 mg/dL   Creatinine, Ser 0.85 0.44 - 1.00 mg/dL   Calcium 8.6 (L) 8.9 - 10.3 mg/dL   Total Protein 5.8 (L) 6.5 - 8.1 g/dL   Albumin 3.3 (L) 3.5 - 5.0 g/dL   AST 36 15 - 41 U/L   ALT 12 0 - 44 U/L   Alkaline Phosphatase 97 38 - 126 U/L   Total Bilirubin 0.5 0.3 - 1.2 mg/dL   GFR calc non Af Amer >60 >60 mL/min   GFR calc Af Amer >60 >60 mL/min   Anion gap 11 5 - 15    Comment: Performed at [facility], [place] [address], [place], [place] [address]  CBC     Status: Abnormal   Collection Time: 12/23/19  9:48 AM  Result Value Ref Range   WBC 6.4 4.0 - 10.5 K/uL   RBC 4.91 3.87 - 5.11 MIL/uL   Hemoglobin 15.8 (H) 12.0 - 15.0 g/dL   HCT 49.9 (H) 36.0 - 46.0 %   MCV 101.6 (H) 80.0 - 100.0 fL   MCH 32.2 26.0 - 34.0 pg   MCHC 31.7 30.0 - 36.0 g/dL   RDW 13.7 11.5 - 15.5 %   Platelets 181 150 - 400 K/uL   nRBC 0.0 0.0 - 0.2 %    Comment: Performed at [facility], [place] [address], [place], [place] [address]  POC SARS Coronavirus 2 Ag-ED - Nasal Swab (BD Veritor Kit)     Status: None   Collection Time: 12/23/19 12:52 PM  Result Value Ref Range   SARS Coronavirus 2 Ag NEGATIVE NEGATIVE    Comment: (NOTE) SARS-CoV-2 antigen NOT DETECTED.  Negative results are presumptive.  Negative  results do not preclude SARS-CoV-2 infection and should not be  used as the sole basis for treatment or other patient management decisions, including infection  control decisions, particularly in the presence of clinical signs and  symptoms consistent with COVID-19, or in those who have been in contact with the virus.  Negative results must be combined with clinical observations, patient history, and epidemiological information. The expected result is Negative. Fact Sheet for Patients: PodPark.tn Fact Sheet for Healthcare Providers: GiftContent.is This test is not yet approved or cleared by the [place] FDA and  has been authorized for detection and/or diagnosis of SARS-CoV-2 by FDA under an Emergency Use Authorization (EUA).  This EUA will remain in effect (meaning this test can be used) for the duration of  the COVID-19 de claration under Section 564(b)(1) of the Act, 21 U.S.C. section 360bbb-3(b)(1), unless the authorization is terminated or revoked sooner.   SARS CORONAVIRUS 2 (TAT 6-24 HRS) Nasopharyngeal Nasopharyngeal Swab     Status: None   Collection Time: 12/23/19  3:21 PM   Specimen: Nasopharyngeal Swab  Result Value Ref Range   SARS Coronavirus 2 NEGATIVE NEGATIVE    Comment: (NOTE) SARS-CoV-2 target nucleic acids are NOT DETECTED. The SARS-CoV-2 RNA is generally detectable in upper and lower respiratory specimens during the acute phase of infection. Negative results do not preclude SARS-CoV-2 infection, do not rule out co-infections with other pathogens, and should not be used as the sole basis for treatment or other patient management decisions. Negative results must be combined with clinical observations, patient history, and epidemiological information. The expected result is Negative. Fact Sheet for Patients: SugarRoll.be Fact Sheet for Healthcare  Providers: https://www.woods-mathews.com/ This test is not yet approved or cleared by the [place] FDA and  has been authorized for detection and/or diagnosis of SARS-CoV-2 by FDA under an Emergency Use Authorization (EUA). This EUA will remain  in effect (meaning this test can be used) for the duration of the COVID-19 declaration under Section 56 4(b)(1) of the Act, 21 U.S.C. section 360bbb-3(b)(1), unless the authorization is terminated or revoked sooner. Performed at [facility], [place] [address] [place], [place] [address]   Urinalysis, Routine w reflex microscopic     Status: Abnormal   Collection Time: 12/23/19  4:22 PM  Result Value Ref Range   Color, Urine YELLOW YELLOW   APPearance CLEAR CLEAR   Specific Gravity, Urine 1.044 (H) 1.005 - 1.030   pH 6.0 5.0 - 8.0   Glucose, UA NEGATIVE NEGATIVE mg/dL   Hgb urine dipstick NEGATIVE NEGATIVE   Bilirubin Urine NEGATIVE NEGATIVE   Ketones, ur NEGATIVE NEGATIVE mg/dL   Protein, ur NEGATIVE NEGATIVE mg/dL   Nitrite NEGATIVE NEGATIVE   Leukocytes,Ua NEGATIVE NEGATIVE   RBC / HPF 0-5 0 - 5 RBC/hpf   WBC, UA 0-5 0 - 5 WBC/hpf   Bacteria, UA NONE SEEN NONE SEEN   Squamous Epithelial / LPF 0-5 0 - 5   Mucus PRESENT     Comment: Performed at [facility], [place] [address], [place], [place] [address]   CT Chest Wo Contrast  Result Date: 12/23/2019 CLINICAL DATA:  Shortness of breath and hypoxia. Abnormal chest x-ray. EXAM: CT CHEST WITHOUT CONTRAST TECHNIQUE: Multidetector CT imaging of the chest was performed following the standard protocol without IV contrast. COMPARISON:  One-view chest x-ray 01/22/2019 FINDINGS: Cardiovascular: The heart size is normal. Dense coronary artery calcifications are present. Atherosclerotic calcifications are present at the aortic arch and origins the great vessels. Mediastinum/Nodes: No significant mediastinal axillary, or hilar adenopathy is  present. Lungs/Pleura: The lung fields  are clear. There is no nodule or mass lesion. The nodular density corresponds with the anterior right rib. Bibasilar airspace disease is present, right greater than left. No effusions are present. There is no pneumothorax. Upper Abdomen: IV contrast is noted within the renal collecting systems. Limited imaging of the upper abdomen is otherwise unremarkable. Musculoskeletal: Leftward curvature is present in the upper thoracic spine. Vertebral body heights are maintained. Focal lytic or blastic lesions are present. IMPRESSION: 1. The nodular density on chest x-ray corresponds with the anterior right rib. 2. Bibasilar airspace disease, right greater than left. While this may represent atelectasis, infection is not excluded. 3. Coronary artery disease. 4. Aortic Atherosclerosis (ICD10-I70.0). Electronically Signed   By: San Morelle M.D.   On: 12/23/2019 19:22   CT ABDOMEN PELVIS W CONTRAST  Result Date: 12/23/2019 CLINICAL DATA:  Abdominal pain, LEFT side abdominal tenderness, vomiting since 0100 hours, low-grade fever, chills, history of ethanol dependence, GERD, hypertension, irritable bowel syndrome, smoker EXAM: CT ABDOMEN AND PELVIS WITH CONTRAST TECHNIQUE: Multidetector CT imaging of the abdomen and pelvis was performed using the standard protocol following bolus administration of intravenous contrast. Sagittal and coronal MPR images reconstructed from axial data set. CONTRAST:  142m OMNIPAQUE IOHEXOL 300 MG/ML SOLN IV. No oral contrast. COMPARISON:  07/17/2017 FINDINGS: Lower chest: Subsegmental atelectasis dependently at both lung bases greater on RIGHT Hepatobiliary: Fatty infiltration of liver. Tiny nonspecific low-attenuation focus anterior LEFT lobe likely 5 mm cyst unchanged. Dependent density within gallbladder likely reflects cholelithiasis. Mild pericholecystic edema. Dilated CBD 11 mm diameter. Filling defect within distal CBD axial image 38 highly suspicious for choledocholithiasis.  Questionable additional subtle intraluminal density on coronal images. Pancreas: Unremarkable Spleen: Normal appearance Adrenals/Urinary Tract: Adrenal glands normal appearance. Cortical thinning of both kidneys without renal mass or hydronephrosis. No urinary tract calcification or ureteral dilatation. Bladder unremarkable. Stomach/Bowel: Appendix surgically absent by history. Bowel wall thickening of transverse colon compatible with colitis. Remainder of colon unremarkable. Small bowel loops normal appearance. Wall thickening of gastric antrum with slight hyperenhancement of mucosa question gastritis; no discrete ulcer identified. Vascular/Lymphatic: Extensive atherosclerotic calcifications aorta, iliac arteries, femoral arteries, coronary arteries. Aorta normal caliber. No adenopathy. Reproductive: Atrophic uterus and ovaries Other: No free air or free fluid.  No hernia. Musculoskeletal: Osseous demineralization. Orthopedic hardware proximal RIGHT femur. Prior lumbar fusion L4-S1. Degenerative disc disease changes L3-L4. IMPRESSION: Bowel wall thickening of the transverse colon consistent with colitis: consider inflammatory bowel disease and infection. Edema of gastric antrum with hyperenhancement of the mucosa raising question of gastritis, with no discrete ulcer identified. Cholelithiasis with pericholecystic fluid, cannot exclude acute cholecystitis. Extrahepatic biliary dilatation with at least 1 filling defect in distal CBD highly suspicious for choledocholithiasis; recommend correlation with LFTs and consider MRCP imaging. Fatty infiltration of liver. Bibasilar atelectasis Aortic Atherosclerosis (ICD10-I70.0). Electronically Signed   By: MLavonia DanaM.D.   On: 12/23/2019 14:28   DG Chest Portable 1 View  Result Date: 12/23/2019 CLINICAL DATA:  Cough and shortness of breath. EXAM: PORTABLE CHEST 1 VIEW COMPARISON:  05/04/2019 FINDINGS: Lungs are hyperexpanded. Interstitial markings are diffusely  coarsened with chronic features. Mild patchy atelectasis or infiltrate noted at the bases. No pleural effusion. Nodular density noted right upper lobe, superimposed on the anterior end of the first rib. Cardiopericardial silhouette is at upper limits of normal for size. Bones are diffusely demineralized. Telemetry leads overlie the chest. IMPRESSION: 1. Emphysema with bibasilar atelectasis or infiltrate. 2. Nodular density in the right  upper lobe may be related to the anterior right first rib and. Follow-up PA and lateral chest x-ray could be used to further assess. Alternatively, CT chest without contrast could be used to further evaluate as clinically warranted. Electronically Signed   By: Misty Stanley M.D.   On: 12/23/2019 13:43   US Abdomen Limited RUQ  Result Date: 12/23/2019 CLINICAL DATA:  Abdomen pain and vomiting since last night EXAM: ULTRASOUND ABDOMEN LIMITED RIGHT UPPER QUADRANT COMPARISON:  September 24, 2004 FINDINGS: Gallbladder: Gallstones are identified. There is pericholecystic fluid. No sonographic Percell Miller sign is reported by the ultrasound technologist. The gallbladder wall measures 3.4 mm. Common bile duct: Diameter: 10.7 mm Liver: No focal lesion identified. Within normal limits in parenchymal echogenicity. Portal vein is patent on color Doppler imaging with normal direction of blood flow towards the liver. Other: None. IMPRESSION: Gallstones identified in the gallbladder with pericholecystic fluid. No sonographic Percell Miller sign is reported by the ultrasound technologist. The findings are equivocal for acute cholecystitis. Electronically Signed   By: Abelardo Diesel M.D.   On: 12/23/2019 15:56    Pending Labs Unresulted Labs (From admission, onward)    Start     Ordered   12/24/19 0500  Comprehensive metabolic panel  Tomorrow morning,   R     12/23/19 2008   12/24/19 0500  CBC  Tomorrow morning,   R     12/23/19 2008   12/23/19 2009  HIV Antibody (routine testing w rflx)  (HIV  Antibody (Routine testing w reflex) panel)  Once,   STAT     12/23/19 2012          Vitals/Pain Today's Vitals   12/23/19 1622 12/23/19 1700 12/23/19 1800 12/23/19 2144  BP: (!) 149/84 138/76 136/77 135/76  Pulse: 78 71 76 78  Resp: _0 (!) 21  Temp:    98.6 F (37 C)  TempSrc:    Oral  SpO2: 100% 100% 99% 95%  Weight:      Height:      PainSc:    6     Isolation Precautions No active isolations  Medications Medications  acetaminophen (TYLENOL) tablet 650 mg (has no administration in time range)    Or  acetaminophen (TYLENOL) suppository 650 mg (has no administration in time range)  ondansetron (ZOFRAN) tablet 4 mg (has no administration in time range)    Or  ondansetron (ZOFRAN) injection 4 mg (has no administration in time range)  0.9 %  sodium chloride infusion ( Intravenous New Bag/Given 12/23/19 2029)  Ampicillin-Sulbactam (UNASYN) 3 g in sodium chloride 0.9 % 100 mL IVPB (has no administration in time range)  morphine (MS CONTIN) 12 hr tablet 60 mg (has no administration in time range)  oxyCODONE (Oxy IR/ROXICODONE) immediate release tablet 10 mg (has no administration in time range)  rosuvastatin (CRESTOR) tablet 5 mg (has no administration in time range)  sertraline (ZOLOFT) tablet 200 mg (has no administration in time range)  valACYclovir (VALTREX) tablet 500 mg (has no administration in time range)  vitamin B-12 (CYANOCOBALAMIN) tablet 100 mcg (has no administration in time range)  busPIRone (BUSPAR) tablet 10 mg (has no administration in time range)  cholecalciferol (VITAMIN D3) tablet 2,000 Units (has no administration in time range)  cholestyramine (QUESTRAN) packet 4 g (has no administration in time range)  folic acid (FOLVITE) tablet 1 mg (has no administration in time range)  gabapentin (NEURONTIN) capsule 300 mg (has no administration in time range)  hydrOXYzine (VISTARIL) capsule 25  mg (has no administration in time range)  ARIPiprazole (ABILIFY)  tablet 2 mg (has no administration in time range)  pantoprazole (PROTONIX) EC tablet 40 mg (has no administration in time range)  sodium chloride flush (NS) 0.9 % injection 3 mL (3 mLs Intravenous Given 12/23/19 1227)  ondansetron (ZOFRAN) injection 4 mg (4 mg Intravenous Given 12/23/19 1227)  sodium chloride 0.9 % bolus 1,000 mL (0 mLs Intravenous Stopped 12/23/19 1731)  iohexol (OMNIPAQUE) 300 MG/ML solution 100 mL (100 mLs Intravenous Contrast Given 12/23/19 1359)  oxyCODONE (OXYCONTIN) 12 hr tablet 10 mg (10 mg Oral Given 12/23/19 2009)    Mobility walks Low fall risk   Focused Assessments     R Recommendations: See Admitting Provider Note  Report given to:   Additional Notes:

## 2019-12-23 NOTE — ED Notes (Signed)
Pt denies SOB at this time but oxygen saturations on room air down to 87%, 2L [place] applied and pt increased to 99%

## 2019-12-23 NOTE — ED Provider Notes (Addendum)
Physical Exam  BP 138/76   Pulse 71   Temp 98.7 F (37.1 C) (Oral)   Resp 18   Ht 5\' 3"  (1.6 m)   Wt 48.5 kg   SpO2 100%   BMI 18.95 kg/m   Physical Exam Vitals and nursing note reviewed.  Constitutional:      General: She is not in acute distress.    Appearance: She is well-developed. She is not diaphoretic.  HENT:     Head: Normocephalic and atraumatic.  Eyes:     General: No scleral icterus.    Conjunctiva/sclera: Conjunctivae normal.  Pulmonary:     Effort: Pulmonary effort is normal. No respiratory distress.  Musculoskeletal:     Cervical back: Normal range of motion.  Skin:    Findings: No rash.  Neurological:     Mental Status: She is alert.     ED Course/Procedures   Clinical Course as of Dec 22 1848  Sat Dec 23, 2019  1459 Reviewed imaging results with patient. She denies any current abdominal pain, no abdominal tenderness.   [SJ]    Clinical Course User Index [SJ] Joy, Shawn C, PA-C    Procedures  MDM   Care of patient assumed from [place] Joy at 3:30 PM.  Agree with history, physical exam and plan.  See their note for further details.  Briefly, 68 y.o. female with PMH/PSH as below who presents with the chief complaint of nausea and vomiting.  Symptoms began last night with several episodes of emesis.  Reports cough and shortness of breath for the past week.  Pain is located in her left lower quadrant and left middle quadrant.  She did have known COVID-19 exposure with family members about 10 days ago.  Work-up here shows normal lab work, CT showing colitis, gastritis and possible cholecystitis/choledocholithiasis.  LFTs and lipase have been normal. She has had oxygen saturations 91 to 87% on room air.  Patient has a history of COPD and states that her oxygen levels will be low 90s usually.  Chest x-ray with nonspecific sounding nodule. She appears overall well on exam per his provider, she does not appear short of breath, abdomen is nontender and she has not  had vomiting since being in the ED.  Past Medical History:  Diagnosis Date  . Acute cystitis   . Acute respiratory failure ([place])   . Allergy    as a child grew out of them  . Anemia    pernicious anemia  . Anxiety   . Arthritis   . Back pain   . Blood transfusion   . CAP (community acquired pneumonia)   . Chronic pain syndrome   . Depression   . Diverticulosis of colon   . Dysthymia   . Esophagitis   . EtOH dependence ([place])   . Gastritis   . GERD (gastroesophageal reflux disease)   . H/O chest pain Dec. 2013   no work up done  . Hx of colonic polyps   . Hypertension   . Irritable bowel syndrome   . Osteopenia   . Osteoporosis   . Pneumonia 2019  . Polypharmacy   . Reflux esophagitis   . Right sided sciatica 12/22/2017  . Tobacco use disorder   . Unspecified chronic bronchitis ([place])   . Vertigo   . Vitamin B12 deficiency   . Wears glasses    Past Surgical History:  Procedure Laterality Date  . APPENDECTOMY    . BACK SURGERY  10-09   Dr.  Cohen  . Wilmon Pali RELEASE Right 08/14/2014   Procedure: RIGHT CARPAL TUNNEL RELEASE AND INJECT LEFT THUMB;  Surgeon: Daryll Brod, MD;  Location: [facility];  Service: Orthopedics;  Laterality: Right;  . COLONOSCOPY    . INTRAMEDULLARY (IM) NAIL INTERTROCHANTERIC Right 10/01/2018   Procedure: INTRAMEDULLARY (IM) NAIL INTERTROCHANTRIC;  Surgeon: Thornton Park, MD;  Location: [facility];  Service: Orthopedics;  Laterality: Right;  . LAPAROSCOPY N/A 02/21/2013   Procedure: LAPAROSCOPY OPERATIVE;  Surgeon: Margarette Asal, MD;  Location: [facility];  Service: Gynecology;  Laterality: N/A;  REQUESTING 5MM SCOPE WITH CAMERA  . LEFT HEART CATH AND CORONARY ANGIOGRAPHY N/A 05/11/2019   Procedure: LEFT HEART CATH AND CORONARY ANGIOGRAPHY;  Surgeon: Sherren Mocha, MD;  Location: [facility];  Service: Cardiovascular;  Laterality: N/A;  . TONSILLECTOMY    . TUBAL LIGATION    . UPPER GASTROINTESTINAL ENDOSCOPY         Current Plan: We will obtain right upper quadrant ultrasound, CT of the chest and or specific Covid test to evaluate for her symptoms.  If right upper quadrant ultrasound shows any signs of cholecystitis, will need to consult surgery for next step. If workup is negative, she can be discharged home with Augmentin and PCP f/u.    MDM/ED Course: 4:39 PM Right upper quadrant ultrasound shows pericholecystic fluid and findings equivocal for acute cholecystitis.  Will consult general surgery for further recommendations.  6:29 PM Nurse states that Dr. Dema Severin is in the OR and he will call me back for recommendations.  6:53 PM Patient continues to rest comfortably.  Awaiting surgery recommendations and CT of the chest.  7:40 PM CT scan of the chest with possible bibasilar infiltrates.  Received a call back from general surgeon Dr. Dema Severin.  He feels that patient's imaging shows common bile duct stone which will need to be managed by medicine with GI consult. States patient is not a good candidate for outpatient management based on her u/s. Will consult for admission.  8:35 PM Spoke to Dr. Lyndel Safe of [facility].  He recommends starting Unasyn x1 dose, IV fluids, pain control and that he will see the patient in consult in the morning.  Appreciate the help of consults for management of this patient.  Consults: General surgery Hospitalist Gastroenterology   Significant labs/images: CT ABDOMEN PELVIS W CONTRAST  Result Date: 12/23/2019 CLINICAL DATA:  Abdominal pain, LEFT side abdominal tenderness, vomiting since 0100 hours, low-grade fever, chills, history of ethanol dependence, GERD, hypertension, irritable bowel syndrome, smoker EXAM: CT ABDOMEN AND PELVIS WITH CONTRAST TECHNIQUE: Multidetector CT imaging of the abdomen and pelvis was performed using the standard protocol following bolus administration of intravenous contrast. Sagittal and coronal MPR images reconstructed from axial data set.  CONTRAST:  182mL OMNIPAQUE IOHEXOL 300 MG/ML SOLN IV. No oral contrast. COMPARISON:  07/17/2017 FINDINGS: Lower chest: Subsegmental atelectasis dependently at both lung bases greater on RIGHT Hepatobiliary: Fatty infiltration of liver. Tiny nonspecific low-attenuation focus anterior LEFT lobe likely 5 mm cyst unchanged. Dependent density within gallbladder likely reflects cholelithiasis. Mild pericholecystic edema. Dilated CBD 11 mm diameter. Filling defect within distal CBD axial image 38 highly suspicious for choledocholithiasis. Questionable additional subtle intraluminal density on coronal images. Pancreas: Unremarkable Spleen: Normal appearance Adrenals/Urinary Tract: Adrenal glands normal appearance. Cortical thinning of both kidneys without renal mass or hydronephrosis. No urinary tract calcification or ureteral dilatation. Bladder unremarkable. Stomach/Bowel: Appendix surgically absent by history. Bowel wall thickening of transverse colon compatible with colitis. Remainder  of colon unremarkable. Small bowel loops normal appearance. Wall thickening of gastric antrum with slight hyperenhancement of mucosa question gastritis; no discrete ulcer identified. Vascular/Lymphatic: Extensive atherosclerotic calcifications aorta, iliac arteries, femoral arteries, coronary arteries. Aorta normal caliber. No adenopathy. Reproductive: Atrophic uterus and ovaries Other: No free air or free fluid.  No hernia. Musculoskeletal: Osseous demineralization. Orthopedic hardware proximal RIGHT femur. Prior lumbar fusion L4-S1. Degenerative disc disease changes L3-L4. IMPRESSION: Bowel wall thickening of the transverse colon consistent with colitis: consider inflammatory bowel disease and infection. Edema of gastric antrum with hyperenhancement of the mucosa raising question of gastritis, with no discrete ulcer identified. Cholelithiasis with pericholecystic fluid, cannot exclude acute cholecystitis. Extrahepatic biliary dilatation  with at least 1 filling defect in distal CBD highly suspicious for choledocholithiasis; recommend correlation with LFTs and consider MRCP imaging. Fatty infiltration of liver. Bibasilar atelectasis Aortic Atherosclerosis (ICD10-I70.0). Electronically Signed   By: Lavonia Dana M.D.   On: 12/23/2019 14:28   DG Chest Portable 1 View  Result Date: 12/23/2019 CLINICAL DATA:  Cough and shortness of breath. EXAM: PORTABLE CHEST 1 VIEW COMPARISON:  05/04/2019 FINDINGS: Lungs are hyperexpanded. Interstitial markings are diffusely coarsened with chronic features. Mild patchy atelectasis or infiltrate noted at the bases. No pleural effusion. Nodular density noted right upper lobe, superimposed on the anterior end of the first rib. Cardiopericardial silhouette is at upper limits of normal for size. Bones are diffusely demineralized. Telemetry leads overlie the chest. IMPRESSION: 1. Emphysema with bibasilar atelectasis or infiltrate. 2. Nodular density in the right upper lobe may be related to the anterior right first rib and. Follow-up PA and lateral chest x-ray could be used to further assess. Alternatively, CT chest without contrast could be used to further evaluate as clinically warranted. Electronically Signed   By: Misty Stanley M.D.   On: 12/23/2019 13:43   US Abdomen Limited RUQ  Result Date: 12/23/2019 CLINICAL DATA:  Abdomen pain and vomiting since last night EXAM: ULTRASOUND ABDOMEN LIMITED RIGHT UPPER QUADRANT COMPARISON:  September 24, 2004 FINDINGS: Gallbladder: Gallstones are identified. There is pericholecystic fluid. No sonographic Percell Miller sign is reported by the ultrasound technologist. The gallbladder wall measures 3.4 mm. Common bile duct: Diameter: 10.7 mm Liver: No focal lesion identified. Within normal limits in parenchymal echogenicity. Portal vein is patent on color Doppler imaging with normal direction of blood flow towards the liver. Other: None. IMPRESSION: Gallstones identified in the  gallbladder with pericholecystic fluid. No sonographic Percell Miller sign is reported by the ultrasound technologist. The findings are equivocal for acute cholecystitis. Electronically Signed   By: Abelardo Diesel M.D.   On: 12/23/2019 15:56     I personally reviewed and interpreted all labs.  The plan for this patient was discussed with Dr. Regenia Skeeter, who voiced agreement and who oversaw evaluation and treatment of this patient.     Delia Heady, PA-C 12/23/19 2036    Sherwood Gambler, MD 12/24/19 1520

## 2019-12-23 NOTE — ED Notes (Signed)
Patient transported to US 

## 2019-12-23 NOTE — ED Notes (Signed)
Pt unable to provide urine specimen at this time

## 2019-12-23 NOTE — ED Provider Notes (Signed)
[facility] EMERGENCY DEPARTMENT Provider Note   CSN: [id number] Arrival date & time: 12/23/19  [id number]     History Chief Complaint  Patient presents with  . Emesis  . Fever    Denise King is a 68 y.o. female.  HPI      Denise King is a 68 y.o. female, with a history of chronic pain syndrome, chronic back pain, anxiety, anemia, GERD, HTN, presenting to the ED with nausea and vomiting beginning last night. Patient states she was feeling overall unwell yesterday.  She has vomited multiple times since last night with the last episode this morning shortly prior to ED arrival. She was assessed by EMS around 8:30 AM this morning, who assessed her temperature at 99.5 F.  Patient declined transport at that time, preferring transport via her husband. She also complains of cough and shortness of breath for about the past week. She adds she has had intermittent diarrhea for the past several months that she has attributed to her [name], however, she has not had diarrhea in the last 24 hours. She typically has left lower quadrant abdominal pain with her diarrhea.  Over the last several days, she has had more central abdominal pain.  No abdominal pain present during the interview. She did take her chronic pain medication this morning. She had exposure to a COVID-19 positive family member about 10 days ago. Denies confirmed fever, hematemesis, hematochezia/melena, chest pain, syncope, urinary symptoms, or any other complaints.     Past Medical History:  Diagnosis Date  . Acute cystitis   . Acute respiratory failure ([place])   . Allergy    as a child grew out of them  . Anemia    pernicious anemia  . Anxiety   . Arthritis   . Back pain   . Blood transfusion   . CAP (community acquired pneumonia)   . Chronic pain syndrome   . Depression   . Diverticulosis of colon   . Dysthymia   . Esophagitis   . EtOH dependence ([place])   . Gastritis   . GERD  (gastroesophageal reflux disease)   . H/O chest pain Dec. 2013   no work up done  . Hx of colonic polyps   . Hypertension   . Irritable bowel syndrome   . Osteopenia   . Osteoporosis   . Pneumonia 2019  . Polypharmacy   . Reflux esophagitis   . Right sided sciatica 12/22/2017  . Tobacco use disorder   . Unspecified chronic bronchitis ([place])   . Vertigo   . Vitamin B12 deficiency   . Wears glasses     Patient Active Problem List   Diagnosis Date Noted  . Polycythemia 09/11/2019  . Right knee pain 08/09/2019  . Quadriceps contusion 08/09/2019  . Abnormal cardiac CT angiography 05/11/2019  . Chest pain 04/11/2019  . Acute viral syndrome 04/11/2019  . Tobacco abuse 04/11/2019  . Hyperphosphatemia 04/11/2019  . SOB (shortness of breath) 04/07/2019  . Hip fracture ([place]) 10/01/2018  . Dysuria 05/20/2018  . Right leg swelling 05/20/2018  . CAP (community acquired pneumonia) 05/13/2018  . Tachycardia 05/13/2018  . Chest wall pain 05/13/2018  . Narcotic poisoning ([place]) 05/13/2018  . EtOH dependence ([place])   . Polypharmacy   . Esophagitis   . Preventative health care 12/22/2017  . Erythrocytosis 12/22/2017  . Depression 12/22/2017  . Hyperglycemia 12/22/2017  . Right sided sciatica 12/22/2017  . Nausea & vomiting 07/17/2017  . Cough 06/10/2017  .  Diarrhea 04/22/2016  . Family history of colon cancer - brother and father 03/10/2016  . Insomnia 02/11/2016  . Generalized anxiety disorder 11/25/2015  . Urinary frequency 04/25/2015  . Nausea and vomiting in adult 11/08/2014  . Cigarette smoker 08/15/2013  . Otitis media 10/27/2012  . Abdominal pain, generalized 03/30/2012  . HEPATIC CYST 02/18/2010  . Chronic pain syndrome 02/08/2010  . Vitamin B12 deficiency 10/04/2009  . GERD 01/04/2009  . ABDOMINAL PAIN-EPIGASTRIC 01/04/2009  . DYSTHYMIA 08/10/2008  . Venous (peripheral) insufficiency 08/10/2008  . Irritable bowel syndrome 08/10/2008  . Back pain 08/10/2008  .  CIGARETTE SMOKER 03/06/2008  . Hx of adenomatous polyp of colon 12/06/2007  . BRONCHITIS, RECURRENT 12/06/2007  . DIVERTICULOSIS OF COLON 12/06/2007  . OSTEOPENIA 12/06/2007  . CALCULUS, KIDNEY 10/07/2007  . VERTIGO 10/07/2007  . Headache(784.0) 10/07/2007    Past Surgical History:  Procedure Laterality Date  . APPENDECTOMY    . BACK SURGERY  10-09   Dr. Patrice Paradise  . CARPAL TUNNEL RELEASE Right 08/14/2014   Procedure: RIGHT CARPAL TUNNEL RELEASE AND INJECT LEFT THUMB;  Surgeon: Daryll Brod, MD;  Location: [facility];  Service: Orthopedics;  Laterality: Right;  . COLONOSCOPY    . INTRAMEDULLARY (IM) NAIL INTERTROCHANTERIC Right 10/01/2018   Procedure: INTRAMEDULLARY (IM) NAIL INTERTROCHANTRIC;  Surgeon: Thornton Park, MD;  Location: [facility];  Service: Orthopedics;  Laterality: Right;  . LAPAROSCOPY N/A 02/21/2013   Procedure: LAPAROSCOPY OPERATIVE;  Surgeon: Margarette Asal, MD;  Location: [facility];  Service: Gynecology;  Laterality: N/A;  REQUESTING 5MM SCOPE WITH CAMERA  . LEFT HEART CATH AND CORONARY ANGIOGRAPHY N/A 05/11/2019   Procedure: LEFT HEART CATH AND CORONARY ANGIOGRAPHY;  Surgeon: Sherren Mocha, MD;  Location: [facility];  Service: Cardiovascular;  Laterality: N/A;  . TONSILLECTOMY    . TUBAL LIGATION    . UPPER GASTROINTESTINAL ENDOSCOPY       OB History   No obstetric history on file.     Family History  Problem Relation Age of Onset  . Colon cancer Father   . Prostate cancer Father   . Heart disease Mother        prev MVR, also has DJD  . Colon cancer Brother   . Skin cancer Sister   . Alcohol abuse Daughter   . Bipolar disorder Daughter   . Drug abuse Daughter   . Esophageal cancer Neg Hx   . Rectal cancer Neg Hx   . Stomach cancer Neg Hx     Social History   Tobacco Use  . Smoking status: Current Every Day Smoker    Packs/day: 1.00    Years: 30.00    Pack years: 30.00    Types: Cigarettes  . Smokeless tobacco: Never Used    Substance Use Topics  . Alcohol use: Yes    Alcohol/week: 3.0 standard drinks    Types: 3 Standard drinks or equivalent per week    Comment: occasional  . Drug use: No    Home Medications Prior to Admission medications   Medication Sig Start Date End Date Taking? Authorizing Provider  ARIPiprazole (ABILIFY) 2 MG tablet Take 1 tablet (2 mg total) by mouth daily. 12/19/19 02/17/20  Pucilowski, Marchia Bond, MD  aspirin 81 MG chewable tablet Chew 1 tablet (81 mg total) by mouth daily. 04/12/19   Sheikh, Georgina Quint Latif, DO  busPIRone (BUSPAR) 10 MG tablet Take 1 tablet (10 mg total) by mouth 3 (three) times daily. 12/19/19   Pucilowski, Olgierd A,  MD  Cholecalciferol (VITAMIN D) 50 MCG (2000 UT) tablet Take 2,000 Units by mouth daily after lunch.    [provider]  cholestyramine Lucrezia Starch) 4 GM/DOSE powder Take 1 packet (4 g total) by mouth 2 (two) times daily with a meal. Patient taking differently: Take 4 g by mouth 2 (two) times daily as needed ([name] (take with meals)).  03/24/19   Gatha Mayer, MD  denosumab (PROLIA) 60 MG/ML SOSY injection Inject 60 mg into the skin every 6 (six) months.    [provider]  diphenhydramine-acetaminophen (TYLENOL PM) 25-500 MG TABS tablet Take 2 tablets by mouth at bedtime as needed (sleep).    [provider]  doxylamine, Sleep, (UNISOM) 25 MG tablet Take 25 mg by mouth at bedtime.     [provider]  folic acid (FOLVITE) 1 MG tablet Take 1 tablet (1 mg total) by mouth daily. Patient taking differently: Take 1 mg by mouth daily after lunch.  09/05/18   Biagio Borg, MD  hydrOXYzine (VISTARIL) 25 MG capsule Take 1 capsule (25 mg total) by mouth every 8 (eight) hours as needed for anxiety. 12/19/19 03/18/20  Pucilowski, Marchia Bond, MD  hyoscyamine (ANASPAZ) 0.125 MG TBDP disintergrating tablet Place 0.125 mg under the tongue every 6 (six) hours as needed ([name] cramps).     [provider]  morphine (MS CONTIN) 60 MG 12 hr  tablet Take 60 mg by mouth every 12 (twelve) hours. 05/02/19   [provider]  Oxycodone HCl 10 MG TABS Take 10 mg by mouth 3 (three) times daily.  03/06/19   [provider]  pantoprazole (PROTONIX) 40 MG tablet Take 1 tablet (40 mg total) by mouth daily. Patient taking differently: Take 40 mg by mouth at bedtime.  04/12/19   Raiford Noble Latif, DO  rosuvastatin (CRESTOR) 5 MG tablet Take 1 tablet (5 mg total) by mouth every Monday, Wednesday, and Friday at 6 PM. 08/30/19 11/28/19  Josue Hector, MD  sertraline (ZOLOFT) 100 MG tablet Take 2 tablets (200 mg total) by mouth at bedtime. 12/19/19 03/18/20  Pucilowski, Marchia Bond, MD  triamcinolone cream (KENALOG) 0.1 % Apply 1 application topically 2 (two) times daily as needed (itching).  03/14/19   [provider]  valACYclovir (VALTREX) 500 MG tablet Take 500 mg by mouth 2 (two) times daily as needed (flare ups).    [provider]  vitamin B-12 (CYANOCOBALAMIN) 100 MCG tablet Take 1 tablet (100 mcg total) by mouth daily. Patient taking differently: Take 100 mcg by mouth daily after lunch.  11/03/18   Biagio Borg, MD    Allergies    Atorvastatin, Levofloxacin, Omnicef [cefdinir], Trazodone and nefazodone, Sulfa antibiotics, and Sulfonamide derivatives  Review of Systems   Review of Systems  Constitutional: Positive for fatigue. Negative for chills, diaphoresis and fever.  Respiratory: Positive for cough and shortness of breath.   Cardiovascular: Negative for chest pain and leg swelling.  Gastrointestinal: Positive for abdominal pain, nausea and vomiting. Negative for blood in stool and diarrhea (none acute).  Genitourinary: Negative for dysuria, flank pain, hematuria, vaginal bleeding and vaginal discharge.  Musculoskeletal: Negative for back pain.  Neurological: Negative for dizziness, syncope, weakness and light-headedness.  All other systems reviewed and are negative.   Physical Exam Updated Vital  Signs BP 135/67 (BP Location: Left Arm)   Pulse 98   Temp 98 F (36.7 C) (Oral)   Resp 20   SpO2 90%   Physical Exam Vitals and  nursing note reviewed.  Constitutional:      General: She is not in acute distress.    Appearance: She is well-developed. She is not diaphoretic.  HENT:     Head: Normocephalic and atraumatic.     Mouth/Throat:     Mouth: Mucous membranes are moist.     Pharynx: Oropharynx is clear.  Eyes:     Conjunctiva/sclera: Conjunctivae normal.  Cardiovascular:     Rate and Rhythm: Normal rate and regular rhythm.     Pulses: Normal pulses.          Radial pulses are 2+ on the right side and 2+ on the left side.       Posterior tibial pulses are 2+ on the right side and 2+ on the left side.     Heart sounds: Normal heart sounds.     Comments: Tactile temperature in the extremities appropriate and equal bilaterally. Pulmonary:     Effort: Pulmonary effort is normal. No respiratory distress.     Breath sounds: Normal breath sounds.  Abdominal:     General: Bowel sounds are normal.     Palpations: Abdomen is soft.     Tenderness: There is abdominal tenderness. There is no guarding.    Musculoskeletal:     Cervical back: Neck supple.     Right lower leg: No edema.     Left lower leg: No edema.  Lymphadenopathy:     Cervical: No cervical adenopathy.  Skin:    General: Skin is warm and dry.  Neurological:     Mental Status: She is alert.  Psychiatric:        Mood and Affect: Mood and affect normal.        Speech: Speech normal.        Behavior: Behavior normal.     ED Results / Procedures / Treatments   Labs (all labs ordered are listed, but only abnormal results are displayed) Labs Reviewed  COMPREHENSIVE METABOLIC PANEL - Abnormal; Notable for the following components:      Result Value   Chloride 92 (*)    Glucose, Bld 121 (*)    BUN <5 (*)    Calcium 8.6 (*)    Total Protein 5.8 (*)    Albumin 3.3 (*)    All other components within normal  limits  CBC - Abnormal; Notable for the following components:   Hemoglobin 15.8 (*)    HCT 49.9 (*)    MCV 101.6 (*)    All other components within normal limits  SARS CORONAVIRUS 2 (TAT 6-24 HRS)  LIPASE, BLOOD  URINALYSIS, ROUTINE W REFLEX MICROSCOPIC  POC SARS CORONAVIRUS 2 AG -  ED    EKG None  Radiology CT ABDOMEN PELVIS W CONTRAST  Result Date: 12/23/2019 CLINICAL DATA:  Abdominal pain, LEFT side abdominal tenderness, vomiting since 0100 hours, low-grade fever, chills, history of ethanol dependence, GERD, hypertension, irritable bowel syndrome, smoker EXAM: CT ABDOMEN AND PELVIS WITH CONTRAST TECHNIQUE: Multidetector CT imaging of the abdomen and pelvis was performed using the standard protocol following bolus administration of intravenous contrast. Sagittal and coronal MPR images reconstructed from axial data set. CONTRAST:  166mL OMNIPAQUE IOHEXOL 300 MG/ML SOLN IV. No oral contrast. COMPARISON:  07/17/2017 FINDINGS: Lower chest: Subsegmental atelectasis dependently at both lung bases greater on RIGHT Hepatobiliary: Fatty infiltration of liver. Tiny nonspecific low-attenuation focus anterior LEFT lobe likely 5 mm cyst unchanged. Dependent density within gallbladder likely reflects cholelithiasis. Mild pericholecystic edema. Dilated CBD 11 mm diameter.  Filling defect within distal CBD axial image 38 highly suspicious for choledocholithiasis. Questionable additional subtle intraluminal density on coronal images. Pancreas: Unremarkable Spleen: Normal appearance Adrenals/Urinary Tract: Adrenal glands normal appearance. Cortical thinning of both kidneys without renal mass or hydronephrosis. No urinary tract calcification or ureteral dilatation. Bladder unremarkable. Stomach/Bowel: Appendix surgically absent by history. Bowel wall thickening of transverse colon compatible with colitis. Remainder of colon unremarkable. Small bowel loops normal appearance. Wall thickening of gastric antrum with  slight hyperenhancement of mucosa question gastritis; no discrete ulcer identified. Vascular/Lymphatic: Extensive atherosclerotic calcifications aorta, iliac arteries, femoral arteries, coronary arteries. Aorta normal caliber. No adenopathy. Reproductive: Atrophic uterus and ovaries Other: No free air or free fluid.  No hernia. Musculoskeletal: Osseous demineralization. Orthopedic hardware proximal RIGHT femur. Prior lumbar fusion L4-S1. Degenerative disc disease changes L3-L4. IMPRESSION: Bowel wall thickening of the transverse colon consistent with colitis: consider inflammatory bowel disease and infection. Edema of gastric antrum with hyperenhancement of the mucosa raising question of gastritis, with no discrete ulcer identified. Cholelithiasis with pericholecystic fluid, cannot exclude acute cholecystitis. Extrahepatic biliary dilatation with at least 1 filling defect in distal CBD highly suspicious for choledocholithiasis; recommend correlation with LFTs and consider MRCP imaging. Fatty infiltration of liver. Bibasilar atelectasis Aortic Atherosclerosis (ICD10-I70.0). Electronically Signed   By: Lavonia Dana M.D.   On: 12/23/2019 14:28   DG Chest Portable 1 View  Result Date: 12/23/2019 CLINICAL DATA:  Cough and shortness of breath. EXAM: PORTABLE CHEST 1 VIEW COMPARISON:  05/04/2019 FINDINGS: Lungs are hyperexpanded. Interstitial markings are diffusely coarsened with chronic features. Mild patchy atelectasis or infiltrate noted at the bases. No pleural effusion. Nodular density noted right upper lobe, superimposed on the anterior end of the first rib. Cardiopericardial silhouette is at upper limits of normal for size. Bones are diffusely demineralized. Telemetry leads overlie the chest. IMPRESSION: 1. Emphysema with bibasilar atelectasis or infiltrate. 2. Nodular density in the right upper lobe may be related to the anterior right first rib and. Follow-up PA and lateral chest x-ray could be used to further  assess. Alternatively, CT chest without contrast could be used to further evaluate as clinically warranted. Electronically Signed   By: Misty Stanley M.D.   On: 12/23/2019 13:43    Procedures Procedures (including critical care time)  Medications Ordered in ED Medications  sodium chloride flush (NS) 0.9 % injection 3 mL (3 mLs Intravenous Given 12/23/19 1227)  ondansetron (ZOFRAN) injection 4 mg (4 mg Intravenous Given 12/23/19 1227)  sodium chloride 0.9 % bolus 1,000 mL (1,000 mLs Intravenous New Bag/Given 12/23/19 1227)  iohexol (OMNIPAQUE) 300 MG/ML solution 100 mL (100 mLs Intravenous Contrast Given 12/23/19 1359)    ED Course  I have reviewed the triage vital signs and the nursing notes.  Pertinent labs & imaging results that were available during my care of the patient were reviewed by me and considered in my medical decision making (see chart for details).  Clinical Course as of Dec 22 1526  Sat Dec 23, 2019  1459 Reviewed imaging results with patient. She denies any current abdominal pain, no abdominal tenderness.   [SJ]    Clinical Course User Index [SJ] Aayana Reinertsen, Helane Gunther, PA-C   MDM Rules/Calculators/A&P                      Patient presents with complaint of nausea and vomiting.  Some abdominal tenderness upon initial evaluation.  Patient is nontoxic appearing, afebrile, not tachycardic, not tachypneic, not hypotensive, and is in  no apparent distress. No leukocytosis.   Findings and plan of care discussed with Lajean Saver, MD. Dr. Ashok Cordia personally evaluated and examined this patient.  End of shift patient care handoff report given to [facility], PA-C. She was noted to have SPO2 of 87 to 91% on room air, though she had no increased work of breathing or tachypnea.  Questionable abnormalities on chest x-ray.  We will get chest CT.  Several abnormalities possibly noted on abdominal CT including colitis, gastritis, cholecystitis, choledocholithiasis.  Patient's LFTs and  lipase are normal.  She has no leukocytosis.  She has no pain or tenderness in the right abdomen or epigastric region.  We will obtain right upper quadrant ultrasound, if normal, reassess patient and likely discharge with antibiotics for colitis, if SPO2 in chest CT findings allow. If abnormal, may need to discuss patient's case with general surgery.     Vitals:   12/23/19 1225 12/23/19 1245 12/23/19 1300 12/23/19 1314  BP:   127/69   Pulse:  85 86   Resp:  18 20   Temp:    98.7 F (37.1 C)  TempSrc:    Oral  SpO2:  98% 98%   Weight: 48.5 kg     Height: 5\' 3"  (1.6 m)        Olla B Yaworski was evaluated in Emergency Department on 12/23/2019 for the symptoms described in the history of present illness. She was evaluated in the context of the global COVID-19 pandemic, which necessitated consideration that the patient might be at risk for infection with the SARS-CoV-2 virus that causes COVID-19. Institutional protocols and algorithms that pertain to the evaluation of patients at risk for COVID-19 are in a state of rapid change based on information released by regulatory bodies including the CDC and federal and state organizations. These policies and algorithms were followed during the patient's care in the ED.  Final Clinical Impression(s) / ED Diagnoses Final diagnoses:  Abdominal pain    Rx / DC Orders ED Discharge Orders    None       Layla Maw 12/23/19 1540    Lajean Saver, MD 12/24/19 1009

## 2019-12-24 ENCOUNTER — Inpatient Hospital Stay (HOSPITAL_COMMUNITY): Payer: Medicare HMO

## 2019-12-24 DIAGNOSIS — K810 Acute cholecystitis: Secondary | ICD-10-CM

## 2019-12-24 DIAGNOSIS — G894 Chronic pain syndrome: Secondary | ICD-10-CM

## 2019-12-24 DIAGNOSIS — K8050 Calculus of bile duct without cholangitis or cholecystitis without obstruction: Secondary | ICD-10-CM

## 2019-12-24 LAB — COMPREHENSIVE METABOLIC PANEL
ALT: 9 U/L (ref 0–44)
AST: 20 U/L (ref 15–41)
Albumin: 2.6 g/dL — ABNORMAL LOW (ref 3.5–5.0)
Alkaline Phosphatase: 80 U/L (ref 38–126)
Anion gap: 8 (ref 5–15)
BUN: <5 mg/dL — ABNORMAL LOW (ref 8–23)
CO2: 31 mmol/L (ref 22–32)
Calcium: 8 mg/dL — ABNORMAL LOW (ref 8.9–10.3)
Chloride: 99 mmol/L (ref 98–111)
Creatinine, Ser: 0.71 mg/dL (ref 0.44–1.00)
GFR calc Af Amer: >60 mL/min (ref >60)
GFR calc non Af Amer: >60 mL/min (ref >60)
Glucose, Bld: 83 mg/dL (ref 70–99)
Potassium: 3.2 mmol/L — ABNORMAL LOW (ref 3.5–5.1)
Sodium: 138 mmol/L (ref 135–145)
Total Bilirubin: 0.9 mg/dL (ref 0.3–1.2)
Total Protein: 5 g/dL — ABNORMAL LOW (ref 6.5–8.1)

## 2019-12-24 LAB — CBC
HCT: 45.6 % (ref 36.0–46.0)
Hemoglobin: 14.4 g/dL (ref 12.0–15.0)
MCH: 32.4 pg (ref 26.0–34.0)
MCHC: 31.6 g/dL (ref 30.0–36.0)
MCV: 102.5 fL — ABNORMAL HIGH (ref 80.0–100.0)
Platelets: 153 10*3/uL (ref 150–400)
RBC: 4.45 MIL/uL (ref 3.87–5.11)
RDW: 13.9 % (ref 11.5–15.5)
WBC: 6 10*3/uL (ref 4.0–10.5)
nRBC: 0 % (ref 0.0–0.2)

## 2019-12-24 MED ORDER — GUAIFENESIN-DM 100-10 MG/5ML PO SYRP
5.0000 mL | ORAL_SOLUTION | ORAL | Status: DC | PRN
  Administered 2019-12-24: 5 mL via ORAL
  Filled 2019-12-24: qty 10

## 2019-12-24 MED ORDER — MUPIROCIN 2 % EX OINT
1.0000 "application " | TOPICAL_OINTMENT | Freq: Two times a day (BID) | CUTANEOUS | Status: AC
  Administered 2019-12-24 – 2019-12-28 (×7): 1 via NASAL
  Filled 2019-12-24 (×5): qty 22

## 2019-12-24 MED ORDER — GADOBUTROL 1 MMOL/ML IV SOLN: 7.5000 mL/kg | Freq: Once | INTRAVENOUS | Status: DC | PRN

## 2019-12-24 MED ORDER — GADOBUTROL 1 MMOL/ML IV SOLN
5.0000 mL | Freq: Once | INTRAVENOUS | Status: AC | PRN
  Administered 2019-12-24: 5 mL via INTRAVENOUS
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
## 2019-12-24 NOTE — Progress Notes (Signed)
Subjective/Chief Complaint: Pt with some abd pain to RUQ    Objective: Vital signs in last 24 hours: Temp:  [98 F (36.7 C)-99.1 F (37.3 C)] 98.8 F (37.1 C) (12/27 0742) Pulse Rate:  [71-99] 83 (12/27 0742) Resp:  [12-21] 16 (12/27 0500) BP: (100-149)/(58-84) 116/63 (12/27 0742) SpO2:  [90 %-100 %] 93 % (12/27 0742) Weight:  [48.5 kg] 48.5 kg (12/26 1225)    Intake/Output from previous day: 12/26 0701 - 12/27 0700 In: 1000 [IV Piggyback:1000] Out: -  Intake/Output this shift: No intake/output data recorded.  PE: Constitutional: No acute distress, conversant, appears states age. Eyes: Anicteric sclerae, moist conjunctiva, no lid lag Lungs: Clear to auscultation bilaterally, normal respiratory effort CV: regular rate and rhythm, no murmurs, no peripheral edema, pedal pulses 2+ GI: Soft, no masses or hepatosplenomegaly, tender to deep palpation RUQ Skin: No rashes, palpation reveals normal turgor Psychiatric: appropriate judgment and insight, oriented to person, place, and time   Lab Results:  Recent Labs    12/23/19 0948 12/24/19 0500  WBC 6.4 6.0  HGB 15.8* 14.4  HCT 49.9* 45.6  PLT 181 153   BMET Recent Labs    12/23/19 0948 12/24/19 0500  NA 135 138  K 3.9 3.2*  CL 92* 99  CO2 32 31  GLUCOSE 121* 83  BUN <5* <5*  CREATININE 0.85 0.71  CALCIUM 8.6* 8.0*   PT/INR No results for input(s): LABPROT, INR in the last 72 hours. ABG No results for input(s): PHART, HCO3 in the last 72 hours.  Invalid input(s): PCO2, PO2  Studies/Results: CT Chest Wo Contrast  Result Date: 12/23/2019 CLINICAL DATA:  Shortness of breath and hypoxia. Abnormal chest x-ray. EXAM: CT CHEST WITHOUT CONTRAST TECHNIQUE: Multidetector CT imaging of the chest was performed following the standard protocol without IV contrast. COMPARISON:  One-view chest x-ray 01/22/2019 FINDINGS: Cardiovascular: The heart size is normal. Dense coronary artery calcifications are present.  Atherosclerotic calcifications are present at the aortic arch and origins the great vessels. Mediastinum/Nodes: No significant mediastinal axillary, or hilar adenopathy is present. Lungs/Pleura: The lung fields are clear. There is no nodule or mass lesion. The nodular density corresponds with the anterior right rib. Bibasilar airspace disease is present, right greater than left. No effusions are present. There is no pneumothorax. Upper Abdomen: IV contrast is noted within the renal collecting systems. Limited imaging of the upper abdomen is otherwise unremarkable. Musculoskeletal: Leftward curvature is present in the upper thoracic spine. Vertebral body heights are maintained. Focal lytic or blastic lesions are present. IMPRESSION: 1. The nodular density on chest x-ray corresponds with the anterior right rib. 2. Bibasilar airspace disease, right greater than left. While this may represent atelectasis, infection is not excluded. 3. Coronary artery disease. 4. Aortic Atherosclerosis (ICD10-I70.0). Electronically Signed   By: San Morelle M.D.   On: 12/23/2019 19:22   CT ABDOMEN PELVIS W CONTRAST  Result Date: 12/23/2019 CLINICAL DATA:  Abdominal pain, LEFT side abdominal tenderness, vomiting since 0100 hours, low-grade fever, chills, history of ethanol dependence, GERD, hypertension, irritable bowel syndrome, smoker EXAM: CT ABDOMEN AND PELVIS WITH CONTRAST TECHNIQUE: Multidetector CT imaging of the abdomen and pelvis was performed using the standard protocol following bolus administration of intravenous contrast. Sagittal and coronal MPR images reconstructed from axial data set. CONTRAST:  166mL OMNIPAQUE IOHEXOL 300 MG/ML SOLN IV. No oral contrast. COMPARISON:  07/17/2017 FINDINGS: Lower chest: Subsegmental atelectasis dependently at both lung bases greater on RIGHT Hepatobiliary: Fatty infiltration of liver. Tiny nonspecific low-attenuation focus  anterior LEFT lobe likely 5 mm cyst unchanged. Dependent  density within gallbladder likely reflects cholelithiasis. Mild pericholecystic edema. Dilated CBD 11 mm diameter. Filling defect within distal CBD axial image 38 highly suspicious for choledocholithiasis. Questionable additional subtle intraluminal density on coronal images. Pancreas: Unremarkable Spleen: Normal appearance Adrenals/Urinary Tract: Adrenal glands normal appearance. Cortical thinning of both kidneys without renal mass or hydronephrosis. No urinary tract calcification or ureteral dilatation. Bladder unremarkable. Stomach/Bowel: Appendix surgically absent by history. Bowel wall thickening of transverse colon compatible with colitis. Remainder of colon unremarkable. Small bowel loops normal appearance. Wall thickening of gastric antrum with slight hyperenhancement of mucosa question gastritis; no discrete ulcer identified. Vascular/Lymphatic: Extensive atherosclerotic calcifications aorta, iliac arteries, femoral arteries, coronary arteries. Aorta normal caliber. No adenopathy. Reproductive: Atrophic uterus and ovaries Other: No free air or free fluid.  No hernia. Musculoskeletal: Osseous demineralization. Orthopedic hardware proximal RIGHT femur. Prior lumbar fusion L4-S1. Degenerative disc disease changes L3-L4. IMPRESSION: Bowel wall thickening of the transverse colon consistent with colitis: consider inflammatory bowel disease and infection. Edema of gastric antrum with hyperenhancement of the mucosa raising question of gastritis, with no discrete ulcer identified. Cholelithiasis with pericholecystic fluid, cannot exclude acute cholecystitis. Extrahepatic biliary dilatation with at least 1 filling defect in distal CBD highly suspicious for choledocholithiasis; recommend correlation with LFTs and consider MRCP imaging. Fatty infiltration of liver. Bibasilar atelectasis Aortic Atherosclerosis (ICD10-I70.0). Electronically Signed   By: Lavonia Dana M.D.   On: 12/23/2019 14:28   DG Chest Portable 1  View  Result Date: 12/23/2019 CLINICAL DATA:  Cough and shortness of breath. EXAM: PORTABLE CHEST 1 VIEW COMPARISON:  05/04/2019 FINDINGS: Lungs are hyperexpanded. Interstitial markings are diffusely coarsened with chronic features. Mild patchy atelectasis or infiltrate noted at the bases. No pleural effusion. Nodular density noted right upper lobe, superimposed on the anterior end of the first rib. Cardiopericardial silhouette is at upper limits of normal for size. Bones are diffusely demineralized. Telemetry leads overlie the chest. IMPRESSION: 1. Emphysema with bibasilar atelectasis or infiltrate. 2. Nodular density in the right upper lobe may be related to the anterior right first rib and. Follow-up PA and lateral chest x-ray could be used to further assess. Alternatively, CT chest without contrast could be used to further evaluate as clinically warranted. Electronically Signed   By: Misty Stanley M.D.   On: 12/23/2019 13:43   US Abdomen Limited RUQ  Result Date: 12/23/2019 CLINICAL DATA:  Abdomen pain and vomiting since last night EXAM: ULTRASOUND ABDOMEN LIMITED RIGHT UPPER QUADRANT COMPARISON:  September 24, 2004 FINDINGS: Gallbladder: Gallstones are identified. There is pericholecystic fluid. No sonographic Percell Miller sign is reported by the ultrasound technologist. The gallbladder wall measures 3.4 mm. Common bile duct: Diameter: 10.7 mm Liver: No focal lesion identified. Within normal limits in parenchymal echogenicity. Portal vein is patent on color Doppler imaging with normal direction of blood flow towards the liver. Other: None. IMPRESSION: Gallstones identified in the gallbladder with pericholecystic fluid. No sonographic Percell Miller sign is reported by the ultrasound technologist. The findings are equivocal for acute cholecystitis. Electronically Signed   By: Abelardo Diesel M.D.   On: 12/23/2019 15:56    Anti-infectives: Anti-infectives (From admission, onward)   Start     Dose/Rate Route  Frequency Ordered Stop   12/23/19 2115  Ampicillin-Sulbactam (UNASYN) 3 g in sodium chloride 0.9 % 100 mL IVPB     3 g 200 mL/hr over 30 Minutes Intravenous Every 6 hours 12/23/19 2100     12/23/19 2103  valACYclovir (  VALTREX) tablet 500 mg     500 mg Oral 2 times daily PRN 12/23/19 2104        Assessment/Plan: 78F cholecystitis, choledochollithiasis -con't NPO -f/u MRCP -Con't abx   If has stone in CBD will likely need ERCP/Lap chole in next 1-2d  LOS: 1 day    Ralene Ok 12/24/2019

## 2019-12-24 NOTE — Progress Notes (Signed)
PROGRESS NOTE    LARAMIE VANWORMER  [id number] DOB: 27-Aug-1951 DOA: 12/23/2019 PCP: Biagio Borg, MD  Outpatient Specialists:   Brief Narrative:  Patient is a 68 year old Caucasian female with past medical history significant for irritable bowel syndrome, chronic pain syndrome, hypertension, GERD, alcohol], diverticulosis, anxiety and anemia.  Patient was admitted with gallstone cholecystitis.  MRI of the abdomen revealed gallstones in the common bile duct.  GI team has been consulted.  For possible ERCP tomorrow.  Surgical team has also been consulted.  Patient is currently pain-free.  No nausea or vomiting at the moment.  Further management depend on hospital course.  Assessment & Plan:   Principal Problem:   Nausea & vomiting Active Problems:   Chronic pain syndrome   Common bile duct stone   Acute cholecystitis  Acute gallstone cholecystitis: -MRI of the abdomen reveals common bile duct stones. -For ERCP in the morning. -Likely cholecystectomy afterwards. -Continue IV antibiotics.  Patient is currently on IV Unasyn.  Nausea and vomiting: -Likely related to above. -No nausea or vomiting at the moment. -Continue to manage expectantly.  Chronic pain syndrome: -Pain is currently controlled.  Documented hypertension: -Blood pressures controlled without antihypertensives at the moment.   DVT prophylaxis: Subcutaneous Lovenox Code Status: Full code Family Communication:  Disposition Plan: This will depend on hospital course   Consultants:   GI  Surgery  Procedures:   ERCP is planned for tomorrow  Antimicrobials:   IV Unasyn   Subjective: No fever or chills No nausea vomiting at the moment. No abdominal pain.  Objective: Vitals:   12/24/19 0421 12/24/19 0500 12/24/19 0742 12/24/19 1437  BP: (!) 100/58  116/63 110/60  Pulse: 99  83 84  Resp:  16    Temp: 99.1 F (37.3 C)  98.8 F (37.1 C) 98.3 F (36.8 C)  TempSrc: Oral  Oral Oral  SpO2:  92%  93% 97%  Weight:      Height:        Intake/Output Summary (Last 24 hours) at 12/24/2019 1651 Last data filed at 12/24/2019 1500 Gross per 24 hour  Intake 1114 ml  Output --  Net 1114 ml   Filed Weights   12/23/19 1225  Weight: 48.5 kg    Examination:  General exam: Appears calm and comfortable.  Patient is cachectic. Respiratory system: Clear to auscultation. Respiratory effort normal. Cardiovascular system: S1 & S2 heard Gastrointestinal system: Abdomen is nondistended, soft and nontender. No organomegaly or masses felt. Normal bowel sounds heard. Central nervous system: Alert and oriented.  Patient moves all extremities.   Extremities: No leg edema.  Data Reviewed: I have personally reviewed following labs and imaging studies  CBC: Recent Labs  Lab 12/23/19 0948 12/24/19 0500  WBC 6.4 6.0  HGB 15.8* 14.4  HCT 49.9* 45.6  MCV 101.6* 102.5*  PLT 181 0000000   Basic Metabolic Panel: Recent Labs  Lab 12/23/19 0948 12/24/19 0500  NA 135 138  K 3.9 3.2*  CL 92* 99  CO2 32 31  GLUCOSE 121* 83  BUN <5* <5*  CREATININE 0.85 0.71  CALCIUM 8.6* 8.0*   GFR: Estimated Creatinine Clearance: 53 mL/min (by C-G formula based on SCr of 0.71 mg/dL). Liver Function Tests: Recent Labs  Lab 12/23/19 0948 12/24/19 0500  AST 36 20  ALT 12 9  ALKPHOS 97 80  BILITOT 0.5 0.9  PROT 5.8* 5.0*  ALBUMIN 3.3* 2.6*   Recent Labs  Lab 12/23/19 0948  LIPASE 20   No  results for input(s): AMMONIA in the last 168 hours. Coagulation Profile: No results for input(s): INR, PROTIME in the last 168 hours. Cardiac Enzymes: No results for input(s): CKTOTAL, CKMB, CKMBINDEX, TROPONINI in the last 168 hours. BNP (last 3 results) No results for input(s): PROBNP in the last 8760 hours. HbA1C: No results for input(s): HGBA1C in the last 72 hours. CBG: No results for input(s): GLUCAP in the last 168 hours. Lipid Profile: No results for input(s): CHOL, HDL, LDLCALC, TRIG, CHOLHDL,  LDLDIRECT in the last 72 hours. Thyroid Function Tests: No results for input(s): TSH, T4TOTAL, FREET4, T3FREE, THYROIDAB in the last 72 hours. Anemia Panel: No results for input(s): VITAMINB12, FOLATE, FERRITIN, TIBC, IRON, RETICCTPCT in the last 72 hours. Urine analysis:    Component Value Date/Time   COLORURINE YELLOW 12/23/2019 1622   APPEARANCEUR CLEAR 12/23/2019 1622   LABSPEC 1.044 (H) 12/23/2019 1622   PHURINE 6.0 12/23/2019 1622   GLUCOSEU NEGATIVE 12/23/2019 1622   GLUCOSEU NEGATIVE 12/22/2017 1650   HGBUR NEGATIVE 12/23/2019 1622   BILIRUBINUR NEGATIVE 12/23/2019 1622   BILIRUBINUR neg 02/07/2016 1627   KETONESUR NEGATIVE 12/23/2019 1622   PROTEINUR NEGATIVE 12/23/2019 1622   UROBILINOGEN 0.2 12/22/2017 1650   NITRITE NEGATIVE 12/23/2019 1622   LEUKOCYTESUR NEGATIVE 12/23/2019 1622   Sepsis Labs: @LABRCNTIP (procalcitonin:4,lacticidven:4)  ) Recent Results (from the past 240 hour(s))  SARS CORONAVIRUS 2 (TAT 6-24 HRS) Nasopharyngeal Nasopharyngeal Swab     Status: None   Collection Time: 12/23/19  3:21 PM   Specimen: Nasopharyngeal Swab  Result Value Ref Range Status   SARS Coronavirus 2 NEGATIVE NEGATIVE Final    Comment: (NOTE) SARS-CoV-2 target nucleic acids are NOT DETECTED. The SARS-CoV-2 RNA is generally detectable in upper and lower respiratory specimens during the acute phase of infection. Negative results do not preclude SARS-CoV-2 infection, do not rule out co-infections with other pathogens, and should not be used as the sole basis for treatment or other patient management decisions. Negative results must be combined with clinical observations, patient history, and epidemiological information. The expected result is Negative. Fact Sheet for Patients: SugarRoll.be Fact Sheet for Healthcare Providers: https://www.woods-mathews.com/ This test is not yet approved or cleared by the [place] FDA and  has been  authorized for detection and/or diagnosis of SARS-CoV-2 by FDA under an Emergency Use Authorization (EUA). This EUA will remain  in effect (meaning this test can be used) for the duration of the COVID-19 declaration under Section 56 4(b)(1) of the Act, 21 U.S.C. section 360bbb-3(b)(1), unless the authorization is terminated or revoked sooner. Performed at [facility], [place] [address], [place], [place] [address]          Radiology Studies: CT Chest Wo Contrast  Result Date: 12/23/2019 CLINICAL DATA:  Shortness of breath and hypoxia. Abnormal chest x-ray. EXAM: CT CHEST WITHOUT CONTRAST TECHNIQUE: Multidetector CT imaging of the chest was performed following the standard protocol without IV contrast. COMPARISON:  One-view chest x-ray 01/22/2019 FINDINGS: Cardiovascular: The heart size is normal. Dense coronary artery calcifications are present. Atherosclerotic calcifications are present at the aortic arch and origins the great vessels. Mediastinum/Nodes: No significant mediastinal axillary, or hilar adenopathy is present. Lungs/Pleura: The lung fields are clear. There is no nodule or mass lesion. The nodular density corresponds with the anterior right rib. Bibasilar airspace disease is present, right greater than left. No effusions are present. There is no pneumothorax. Upper Abdomen: IV contrast is noted within the renal collecting systems. Limited imaging of the upper abdomen is  otherwise unremarkable. Musculoskeletal: Leftward curvature is present in the upper thoracic spine. Vertebral body heights are maintained. Focal lytic or blastic lesions are present. IMPRESSION: 1. The nodular density on chest x-ray corresponds with the anterior right rib. 2. Bibasilar airspace disease, right greater than left. While this may represent atelectasis, infection is not excluded. 3. Coronary artery disease. 4. Aortic Atherosclerosis (ICD10-I70.0). Electronically Signed   By: San Morelle M.D.    On: 12/23/2019 19:22   CT ABDOMEN PELVIS W CONTRAST  Result Date: 12/23/2019 CLINICAL DATA:  Abdominal pain, LEFT side abdominal tenderness, vomiting since 0100 hours, low-grade fever, chills, history of ethanol dependence, GERD, hypertension, irritable bowel syndrome, smoker EXAM: CT ABDOMEN AND PELVIS WITH CONTRAST TECHNIQUE: Multidetector CT imaging of the abdomen and pelvis was performed using the standard protocol following bolus administration of intravenous contrast. Sagittal and coronal MPR images reconstructed from axial data set. CONTRAST:  142mL OMNIPAQUE IOHEXOL 300 MG/ML SOLN IV. No oral contrast. COMPARISON:  07/17/2017 FINDINGS: Lower chest: Subsegmental atelectasis dependently at both lung bases greater on RIGHT Hepatobiliary: Fatty infiltration of liver. Tiny nonspecific low-attenuation focus anterior LEFT lobe likely 5 mm cyst unchanged. Dependent density within gallbladder likely reflects cholelithiasis. Mild pericholecystic edema. Dilated CBD 11 mm diameter. Filling defect within distal CBD axial image 38 highly suspicious for choledocholithiasis. Questionable additional subtle intraluminal density on coronal images. Pancreas: Unremarkable Spleen: Normal appearance Adrenals/Urinary Tract: Adrenal glands normal appearance. Cortical thinning of both kidneys without renal mass or hydronephrosis. No urinary tract calcification or ureteral dilatation. Bladder unremarkable. Stomach/Bowel: Appendix surgically absent by history. Bowel wall thickening of transverse colon compatible with colitis. Remainder of colon unremarkable. Small bowel loops normal appearance. Wall thickening of gastric antrum with slight hyperenhancement of mucosa question gastritis; no discrete ulcer identified. Vascular/Lymphatic: Extensive atherosclerotic calcifications aorta, iliac arteries, femoral arteries, coronary arteries. Aorta normal caliber. No adenopathy. Reproductive: Atrophic uterus and ovaries Other: No free air  or free fluid.  No hernia. Musculoskeletal: Osseous demineralization. Orthopedic hardware proximal RIGHT femur. Prior lumbar fusion L4-S1. Degenerative disc disease changes L3-L4. IMPRESSION: Bowel wall thickening of the transverse colon consistent with colitis: consider inflammatory bowel disease and infection. Edema of gastric antrum with hyperenhancement of the mucosa raising question of gastritis, with no discrete ulcer identified. Cholelithiasis with pericholecystic fluid, cannot exclude acute cholecystitis. Extrahepatic biliary dilatation with at least 1 filling defect in distal CBD highly suspicious for choledocholithiasis; recommend correlation with LFTs and consider MRCP imaging. Fatty infiltration of liver. Bibasilar atelectasis Aortic Atherosclerosis (ICD10-I70.0). Electronically Signed   By: Lavonia Dana M.D.   On: 12/23/2019 14:28   MR 3D Recon At Scanner  Result Date: 12/24/2019 CLINICAL DATA:  Gallstones with biliary dilatation. EXAM: MRI ABDOMEN WITHOUT AND WITH CONTRAST (INCLUDING MRCP) TECHNIQUE: Multiplanar multisequence MR imaging of the abdomen was performed both before and after the administration of intravenous contrast. Heavily T2-weighted images of the biliary and pancreatic ducts were obtained, and three-dimensional MRCP images were rendered by post processing. CONTRAST:  5mL GADAVIST GADOBUTROL 1 MMOL/ML IV SOLN COMPARISON:  Abdomen/pelvis CT 12/23/2019. Abdominal ultrasound 12/23/2019. FINDINGS: Lower chest: Dependent atelectasis with tiny bilateral effusions. Hepatobiliary: Tiny cyst noted in the anterior left liver. No suspicious enhancing liver abnormality. Gallbladder is distended with small volume pericholecystic fluid. Several tiny/granular stones are visualized in the gallbladder lumen (image 12 coronal T2 series 12). Common bile duct in the head of pancreas measures up to 8-9 mm diameter with some dependent sludge and probable tiny dependent stones (coronal T2 image 9 of  series 12). Common bile duct inserts low on the descending duodenum. Pancreas: Pancreas divisum ductal anatomy. No evidence for pancreatic mass lesion although assessment on postcontrast imaging is motion degraded. Spleen:  No splenomegaly. No focal mass lesion. Adrenals/Urinary Tract: No adrenal nodule or mass. Tiny T2 hyperintense lesions in the cortex of both kidney show no appreciable enhancement after IV contrast administration, but assessment on postcontrast imaging is markedly motion degraded. Stomach/Bowel: Stomach is unremarkable. No gastric wall thickening. No evidence of outlet obstruction. Duodenum is normally positioned as is the ligament of Treitz. No small bowel or colonic dilatation within the visualized abdomen. Wall thickening in the transverse colon described on the recent CT scan is not well demonstrated by MR. Vascular/Lymphatic: No abdominal aortic aneurysm. No abdominal lymphadenopathy. Other: Trace perihepatic fluid with trace fluid in the gallbladder fossa. Body wall edema noted. Musculoskeletal: Status post lumbosacral fusion. No suspicious marrow enhancement within the visualized bony anatomy. IMPRESSION: 1. Cholelithiasis with small volume pericholecystic fluid. Mild intra and extrahepatic biliary duct dilatation is associated with several apparent tiny granular stones in the common bile duct. 2. Pancreas divisum anatomy with major papilla low on the descending duodenum. 3. Probable tiny bilateral renal cysts incompletely characterized due to substantial motion degradation on postcontrast imaging today. Electronically Signed   By: Misty Stanley M.D.   On: 12/24/2019 10:43   DG Chest Portable 1 View  Result Date: 12/23/2019 CLINICAL DATA:  Cough and shortness of breath. EXAM: PORTABLE CHEST 1 VIEW COMPARISON:  05/04/2019 FINDINGS: Lungs are hyperexpanded. Interstitial markings are diffusely coarsened with chronic features. Mild patchy atelectasis or infiltrate noted at the bases. No  pleural effusion. Nodular density noted right upper lobe, superimposed on the anterior end of the first rib. Cardiopericardial silhouette is at upper limits of normal for size. Bones are diffusely demineralized. Telemetry leads overlie the chest. IMPRESSION: 1. Emphysema with bibasilar atelectasis or infiltrate. 2. Nodular density in the right upper lobe may be related to the anterior right first rib and. Follow-up PA and lateral chest x-ray could be used to further assess. Alternatively, CT chest without contrast could be used to further evaluate as clinically warranted. Electronically Signed   By: Misty Stanley M.D.   On: 12/23/2019 13:43   MR ABDOMEN MRCP W WO CONTAST  Result Date: 12/24/2019 CLINICAL DATA:  Gallstones with biliary dilatation. EXAM: MRI ABDOMEN WITHOUT AND WITH CONTRAST (INCLUDING MRCP) TECHNIQUE: Multiplanar multisequence MR imaging of the abdomen was performed both before and after the administration of intravenous contrast. Heavily T2-weighted images of the biliary and pancreatic ducts were obtained, and three-dimensional MRCP images were rendered by post processing. CONTRAST:  7mL GADAVIST GADOBUTROL 1 MMOL/ML IV SOLN COMPARISON:  Abdomen/pelvis CT 12/23/2019. Abdominal ultrasound 12/23/2019. FINDINGS: Lower chest: Dependent atelectasis with tiny bilateral effusions. Hepatobiliary: Tiny cyst noted in the anterior left liver. No suspicious enhancing liver abnormality. Gallbladder is distended with small volume pericholecystic fluid. Several tiny/granular stones are visualized in the gallbladder lumen (image 12 coronal T2 series 12). Common bile duct in the head of pancreas measures up to 8-9 mm diameter with some dependent sludge and probable tiny dependent stones (coronal T2 image 9 of series 12). Common bile duct inserts low on the descending duodenum. Pancreas: Pancreas divisum ductal anatomy. No evidence for pancreatic mass lesion although assessment on postcontrast imaging is motion  degraded. Spleen:  No splenomegaly. No focal mass lesion. Adrenals/Urinary Tract: No adrenal nodule or mass. Tiny T2 hyperintense lesions in the cortex of both kidney show no appreciable  enhancement after IV contrast administration, but assessment on postcontrast imaging is markedly motion degraded. Stomach/Bowel: Stomach is unremarkable. No gastric wall thickening. No evidence of outlet obstruction. Duodenum is normally positioned as is the ligament of Treitz. No small bowel or colonic dilatation within the visualized abdomen. Wall thickening in the transverse colon described on the recent CT scan is not well demonstrated by MR. Vascular/Lymphatic: No abdominal aortic aneurysm. No abdominal lymphadenopathy. Other: Trace perihepatic fluid with trace fluid in the gallbladder fossa. Body wall edema noted. Musculoskeletal: Status post lumbosacral fusion. No suspicious marrow enhancement within the visualized bony anatomy. IMPRESSION: 1. Cholelithiasis with small volume pericholecystic fluid. Mild intra and extrahepatic biliary duct dilatation is associated with several apparent tiny granular stones in the common bile duct. 2. Pancreas divisum anatomy with major papilla low on the descending duodenum. 3. Probable tiny bilateral renal cysts incompletely characterized due to substantial motion degradation on postcontrast imaging today. Electronically Signed   By: Misty Stanley M.D.   On: 12/24/2019 10:43   US Abdomen Limited RUQ  Result Date: 12/23/2019 CLINICAL DATA:  Abdomen pain and vomiting since last night EXAM: ULTRASOUND ABDOMEN LIMITED RIGHT UPPER QUADRANT COMPARISON:  September 24, 2004 FINDINGS: Gallbladder: Gallstones are identified. There is pericholecystic fluid. No sonographic Percell Miller sign is reported by the ultrasound technologist. The gallbladder wall measures 3.4 mm. Common bile duct: Diameter: 10.7 mm Liver: No focal lesion identified. Within normal limits in parenchymal echogenicity. Portal vein is  patent on color Doppler imaging with normal direction of blood flow towards the liver. Other: None. IMPRESSION: Gallstones identified in the gallbladder with pericholecystic fluid. No sonographic Percell Miller sign is reported by the ultrasound technologist. The findings are equivocal for acute cholecystitis. Electronically Signed   By: Abelardo Diesel M.D.   On: 12/23/2019 15:56        Scheduled Meds:  ARIPiprazole  2 mg Oral QHS   busPIRone  10 mg Oral TID   cholecalciferol  2,000 Units Oral QPC lunch   folic acid  1 mg Oral QPC lunch   gabapentin  300 mg Oral TID   morphine  60 mg Oral Q12H   mupirocin ointment  1 application Nasal BID   pantoprazole  40 mg Oral QHS   [START ON 12/25/2019] rosuvastatin  5 mg Oral Q M,W,F-1800   sertraline  200 mg Oral QHS   vitamin B-12  100 mcg Oral QPC lunch   Continuous Infusions:  sodium chloride 50 mL/hr at 12/23/19 2029   ampicillin-sulbactam (UNASYN) IV 3 g (12/24/19 1053)     LOS: 1 day    Time spent: 35 minutes    Dana Allan, MD  [facility] Pager #: [phone] 7PM-7AM contact night coverage as above

## 2019-12-24 NOTE — Consult Note (Addendum)
[facility] Consult: 12:17 PM 12/24/2019  LOS: 1 day    Referring Provider: Dr Marthenia Rolling  Primary Care Physician:  Biagio Borg, MD Primary Gastroenterologist:  Dr. Carlean Purl    Reason for Consultation: Choledocholithiasis.   HPI: Denise King is a 68 y.o. female.  See list below for patient's past medical history but significant or her history of [name] D and chronic pain syndrome, chronic narcotics. Several endoscopies and colonoscopies over the last 37 years.   Latest EGD 01/2014.  For diarrhea, positive antigliadin antibody, question celiac disease.  Antral gastritis (ulcerated gastric body and antral type mucosa).  Diminutive antral polyp removed (foveolar hyperplasia).  Otherwise normal studies.  Biopsies obtained of the gastric antrum and duodenum (No villous atrophy or other SB abnormalities.  No H. pylori, metaplasia, dysplasia, malignancy). Latest colonoscopy 03/2016 for diarrhea.  4 mm ascending colon polyp (tubular adenoma, no HGD) o/w normal colonic mucosa.  Random biopsies of colon (all normal).  Normal examined terminal ileum.    Chronic diarrhea again now for the last 4 months with anorexia.  Weight is down about 11 pounds.  She takes bismuth occasionally which helps but does not stop the diarrhea.  She is afraid to take bismuth on a regular basis due to labeling on the bottle though Dr. Carlean Purl has informed her it is okay to use bismuth.    Starting several hours after Christmas dinner she developed nausea and vomiting of partially digested food and then just bile but no blood.  No abdominal pain.  No fevers or sweats.  She says her family became concerned yesterday when she seemed less responsive and was not breathing that well.  EMS came to the house and suggested she go to the ED.  Her husband  brought her to the ED. Initial charting mentions that she was having some mid abdominal pain.  T bili 0.9.  Alk phos 80, AST/ALT 20/9. Potassium 3.2.  Normal renal function. Normal Hb 14.4.  Macrocytosis at 102.5. COVID-19 negative.  CTAP w contrast: Transverse bowel wall thickening consistent with colitis.  Edema and hyper enhancement of mucosa in the gastric antrum, ?  Gastritis.  Cholelithiasis, pericholecystic fluid.  11 mm diameter CBD with at least one filling defect in distal duct.  Fatty liver. Abdominal ultrasound: Gallstones, pericholecystic fluid, no sonographic Murphy sign.  Findings equivocal for acute cholecystitis. MRI/MRCP: Cholelithiasis, small pericholecystic fluid.  Mild intra and extrahepatic biliary ductal dilatation with several small granular stones in CBD.  CBD diameter 8 to 9 mm..  Pancreas divisum anatomy with major papilla low on descending duodenum.  Probable tiny renal cysts.  Fm Hx colon cancer in brother and father.  Drug and alcohol abuse, bipolar disorder in daughter.  Heart disease in mother.  Prostate cancer father, skin cancer sister.. Daughter status post cholecystectomy age 54.  Social history.  Disabled with chronic pain as well as due to her diarrhea and associated fecal incontinence.  She smokes 1 pack cigarettes a day.  Drinks [place] wine a glass or 2, 7 times a month  Past Medical History:  Diagnosis Date  . Acute cystitis   . Acute respiratory failure ([place])   . Allergy    as a child grew out of them  . Anemia    pernicious anemia  . Anxiety   . Arthritis   . Back pain   . Blood transfusion   . CAP (community acquired pneumonia)   . Chronic pain syndrome   . Depression   . Diverticulosis of colon   . Dysthymia   . Esophagitis   . EtOH dependence ([place])   . Gastritis   . GERD (gastroesophageal reflux disease)   . H/O chest pain Dec. 2013   no work up done  . Hx of colonic polyps   . Hypertension   . Irritable bowel syndrome   .  Osteopenia   . Osteoporosis   . Pneumonia 2019  . Polypharmacy   . Reflux esophagitis   . Right sided sciatica 12/22/2017  . Tobacco use disorder   . Unspecified chronic bronchitis ([place])   . Vertigo   . Vitamin B12 deficiency   . Wears glasses     Past Surgical History:  Procedure Laterality Date  . APPENDECTOMY    . BACK SURGERY  10-09   Dr. Patrice Paradise  . CARPAL TUNNEL RELEASE Right 08/14/2014   Procedure: RIGHT CARPAL TUNNEL RELEASE AND INJECT LEFT THUMB;  Surgeon: Daryll Brod, MD;  Location: [facility];  Service: Orthopedics;  Laterality: Right;  . COLONOSCOPY    . INTRAMEDULLARY (IM) NAIL INTERTROCHANTERIC Right 10/01/2018   Procedure: INTRAMEDULLARY (IM) NAIL INTERTROCHANTRIC;  Surgeon: Thornton Park, MD;  Location: [facility];  Service: Orthopedics;  Laterality: Right;  . LAPAROSCOPY N/A 02/21/2013   Procedure: LAPAROSCOPY OPERATIVE;  Surgeon: Margarette Asal, MD;  Location: [facility];  Service: Gynecology;  Laterality: N/A;  REQUESTING 5MM SCOPE WITH CAMERA  . LEFT HEART CATH AND CORONARY ANGIOGRAPHY N/A 05/11/2019   Procedure: LEFT HEART CATH AND CORONARY ANGIOGRAPHY;  Surgeon: Sherren Mocha, MD;  Location: [facility];  Service: Cardiovascular;  Laterality: N/A;  . TONSILLECTOMY    . TUBAL LIGATION    . UPPER GASTROINTESTINAL ENDOSCOPY      Prior to Admission medications   Medication Sig Start Date End Date Taking? Authorizing Provider  ARIPiprazole (ABILIFY) 2 MG tablet Take 1 tablet (2 mg total) by mouth daily. Patient taking differently: Take 2 mg by mouth at bedtime.  12/19/19 02/17/20 Yes Pucilowski, Marchia Bond, MD  aspirin 81 MG chewable tablet Chew 1 tablet (81 mg total) by mouth daily. 04/12/19  Yes Sheikh, Omair Latif, DO  busPIRone (BUSPAR) 10 MG tablet Take 1 tablet (10 mg total) by mouth 3 (three) times daily. 12/19/19  Yes Pucilowski, Olgierd A, MD  Cholecalciferol (VITAMIN D) 50 MCG (2000 UT) tablet Take 2,000 Units by mouth daily after lunch.    Yes [provider]  cholestyramine (QUESTRAN) 4 GM/DOSE powder Take 1 packet (4 g total) by mouth 2 (two) times daily with a meal. Patient taking differently: Take 4 g by mouth 2 (two) times daily as needed ([name] (take with meals)).  03/24/19  Yes Gatha Mayer, MD  denosumab (PROLIA) 60 MG/ML SOSY injection Inject 60 mg into the skin every 6 (six) months.   Yes [provider]  diphenhydramine-acetaminophen (TYLENOL PM) 25-500 MG TABS tablet Take 2 tablets by mouth at bedtime as needed (sleep).   Yes [provider]  doxylamine, Sleep, (UNISOM) 25 MG tablet Take 25 mg by mouth  at bedtime.    Yes [provider]  folic acid (FOLVITE) 1 MG tablet Take 1 tablet (1 mg total) by mouth daily. Patient taking differently: Take 1 mg by mouth daily after lunch.  09/05/18  Yes Biagio Borg, MD  gabapentin (NEURONTIN) 300 MG capsule Take 300 mg by mouth 3 (three) times daily.   Yes [provider]  hydrOXYzine (VISTARIL) 25 MG capsule Take 1 capsule (25 mg total) by mouth every 8 (eight) hours as needed for anxiety. 12/19/19 03/18/20 Yes Pucilowski, Olgierd A, MD  hyoscyamine (ANASPAZ) 0.125 MG TBDP disintergrating tablet Place 0.125 mg under the tongue every 6 (six) hours as needed ([name] cramps).    Yes [provider]  morphine (MS CONTIN) 60 MG 12 hr tablet Take 60 mg by mouth every 12 (twelve) hours. 05/02/19  Yes [provider]  Oxycodone HCl 10 MG TABS Take 10 mg by mouth 4 (four) times daily.  03/06/19  Yes [provider]  pantoprazole (PROTONIX) 40 MG tablet Take 1 tablet (40 mg total) by mouth daily. Patient taking differently: Take 40 mg by mouth at bedtime.  04/12/19  Yes Sheikh, Cornell, DO  rosuvastatin (CRESTOR) 5 MG tablet Take 1 tablet (5 mg total) by mouth every Monday, Wednesday, and Friday at 6 PM. 08/30/19 01/28/20 Yes Josue Hector, MD  sertraline (ZOLOFT) 100 MG tablet Take 2 tablets (200 mg total) by mouth at bedtime.  12/19/19 03/18/20 Yes Pucilowski, Olgierd A, MD  triamcinolone cream (KENALOG) 0.1 % Apply 1 application topically 2 (two) times daily as needed (itching).  03/14/19  Yes [provider]  valACYclovir (VALTREX) 500 MG tablet Take 500 mg by mouth 2 (two) times daily as needed (flare ups).   Yes [provider]  vitamin B-12 (CYANOCOBALAMIN) 100 MCG tablet Take 1 tablet (100 mcg total) by mouth daily. Patient taking differently: Take 100 mcg by mouth daily after lunch.  11/03/18  Yes Biagio Borg, MD    Scheduled Meds: . ARIPiprazole  2 mg Oral QHS  . busPIRone  10 mg Oral TID  . cholecalciferol  2,000 Units Oral QPC lunch  . folic acid  1 mg Oral QPC lunch  . gabapentin  300 mg Oral TID  . morphine  60 mg Oral Q12H  . mupirocin ointment  1 application Nasal BID  . pantoprazole  40 mg Oral QHS  . [START ON 12/25/2019] rosuvastatin  5 mg Oral Q M,W,F-1800  . sertraline  200 mg Oral QHS  . vitamin B-12  100 mcg Oral QPC lunch   Infusions: . sodium chloride 50 mL/hr at 12/23/19 2029  . ampicillin-sulbactam (UNASYN) IV 3 g (12/24/19 1053)   PRN Meds: acetaminophen **OR** acetaminophen, cholestyramine, gadobutrol, hydrOXYzine, ondansetron **OR** ondansetron (ZOFRAN) IV, oxyCODONE, valACYclovir   Allergies as of 12/23/2019 - Review Complete 12/23/2019  Allergen Reaction Noted  . Atorvastatin Nausea And Vomiting   . Levofloxacin Other (See Comments) 04/13/2011  . Omnicef [cefdinir] Diarrhea 02/13/2013  . Trazodone and nefazodone Other (See Comments) 09/21/2011  . Sulfa antibiotics Rash 04/07/2019  . Sulfonamide derivatives Rash     Family History  Problem Relation Age of Onset  . Colon cancer Father   . Prostate cancer Father   . Heart disease Mother        prev MVR, also has DJD  . Colon cancer Brother   . Skin cancer Sister   . Alcohol abuse Daughter   . Bipolar disorder Daughter   . Drug  abuse Daughter   . Esophageal cancer Neg Hx   . Rectal cancer Neg Hx     . Stomach cancer Neg Hx     Social History   Socioeconomic History  . Marital status: Married    Spouse name: Not on file  . Number of children: 4  . Years of education: 65  . Highest education level: Not on file  Occupational History  . Occupation: retired    Comment: disablility  Tobacco Use  . Smoking status: Current Every Day Smoker    Packs/day: 1.00    Years: 30.00    Pack years: 30.00    Types: Cigarettes  . Smokeless tobacco: Never Used  Substance and Sexual Activity  . Alcohol use: Yes    Alcohol/week: 3.0 standard drinks    Types: 3 Standard drinks or equivalent per week    Comment: occasional  . Drug use: No  . Sexual activity: Yes  Other Topics Concern  . Not on file  Social History Narrative   Married x 38 yrs. Has BS degree in Horticulture from [employer]. Currently resides in a house with her husband. 3 cats.  Fun: used to like to do a lot of things.    Denies religious beliefs that would effect health care.    lives with husband [name]; quit smoking- October 6th, 2019. ocasional alcohol. [employer] [2009] on disability sec to back issues.    Social Determinants of Health   Financial Resource Strain: Low Risk   . Difficulty of Paying Living Expenses: Not very hard  Food Insecurity: No Food Insecurity  . Worried About Charity fundraiser in the Last Year: Never true  . Ran Out of Food in the Last Year: Never true  Transportation Needs: No Transportation Needs  . Lack of Transportation (Medical): No  . Lack of Transportation (Non-Medical): No  Physical Activity: Inactive  . Days of Exercise per Week: 0 days  . Minutes of Exercise per Session: 0 min  Stress: Stress Concern Present  . Feeling of Stress : Rather much  Social Connections: Somewhat Isolated  . Frequency of Communication with Friends and Family: More than three times a week  . Frequency of Social Gatherings with Friends and Family: Once a week  . Attends Religious Services:  Never  . Active Member of Clubs or Organizations: No  . Attends Archivist Meetings: Never  . Marital Status: Married  Human resources officer Violence: Not At Risk  . Fear of Current or Ex-Partner: No  . Emotionally Abused: No  . Physically Abused: No  . Sexually Abused: No    REVIEW OF SYSTEMS: Constitutional: Weakness, fatigue. ENT:  No nose bleeds Pulm: Clear sputum with cough.  Smokes a pack of cigarettes a day.  No dyspnea. CV:  No palpitations, no LE edema.  No angina. GU:  No hematuria, no frequency.  No dark-colored urine. GI: See HPI. Heme: No unusual bleeding or bruising Transfusions: She does not recall prior transfusion with blood products. Neuro:  No headaches, no peripheral tingling or numbness Derm:  No itching, no rash or sores.  Endocrine:  No sweats or chills.  No polyuria or dysuria Immunization: Vaccination history reviewed, received flu shot in 08/2019 Travel:  None   PHYSICAL EXAM: Vital signs in last 24 hours: Vitals:   12/24/19 0500 12/24/19 0742  BP:  116/63  Pulse:  83  Resp: 16   Temp:  98.8 F (37.1 C)  SpO2:  93%   Wt Readings  from Last 3 Encounters:  12/23/19 48.5 kg  09/11/19 51.7 kg  09/10/19 51.3 kg    General: Thin, chronically ill-appearing. Head: No facial asymmetry or swelling.  No signs of head trauma. Eyes: No scleral icterus, no conjunctival pallor.  EOMI. Ears: No hearing loss Nose: Congestion but no drainage. Mouth: Moist, clear, pink oral mucosa.  Tongue midline.  Good dentition. Neck: No JVD, no masses, no thyromegaly. Lungs: Clear bilaterally.  Active cough with sounds of congested sputum. Heart: RRR.  No MRG.  S1, S2 present. Abdomen: Soft.  Slight tenderness in the right upper abdomen without guarding or rebound.  Active bowel sounds.  No distention.  No HSM, bruits, masses, hernias.   Rectal: Deferred Musc/Skeltl: No joint redness swelling or gross deformity. Extremities: No CCE. Neurologic: Oriented x3.   Drowsy but arousable.  Moves all 4 limbs.  No tremors. Skin: No rash, no sores, no large bruises. Tattoos: None observed Nodes: No inguinal or cervical adenopathy Psych: Flat affect, cooperative.    Intake/Output from previous day: 12/26 0701 - 12/27 0700 In: 1000 [IV Piggyback:1000] Out: -  Intake/Output this shift: Total I/O In: 114 [P.O.:114] Out: -   LAB RESULTS: Recent Labs    12/23/19 0948 12/24/19 0500  WBC 6.4 6.0  HGB 15.8* 14.4  HCT 49.9* 45.6  PLT 181 153   BMET Lab Results  Component Value Date   NA 138 12/24/2019   NA 135 12/23/2019   NA 134 (L) 09/10/2019   K 3.2 (L) 12/24/2019   K 3.9 12/23/2019   K 3.9 09/10/2019   CL 99 12/24/2019   CL 92 (L) 12/23/2019   CL 95 (L) 09/10/2019   CO2 31 12/24/2019   CO2 32 12/23/2019   CO2 25 09/10/2019   GLUCOSE 83 12/24/2019   GLUCOSE 121 (H) 12/23/2019   GLUCOSE 90 09/10/2019   BUN <5 (L) 12/24/2019   BUN <5 (L) 12/23/2019   BUN 7 (L) 09/10/2019   CREATININE 0.71 12/24/2019   CREATININE 0.85 12/23/2019   CREATININE 0.85 09/10/2019   CALCIUM 8.0 (L) 12/24/2019   CALCIUM 8.6 (L) 12/23/2019   CALCIUM 8.9 09/10/2019   LFT Recent Labs    12/23/19 0948 12/24/19 0500  PROT 5.8* 5.0*  ALBUMIN 3.3* 2.6*  AST 36 20  ALT 12 9  ALKPHOS 97 80  BILITOT 0.5 0.9   PT/INR Lab Results  Component Value Date   INR 0.9 09/10/2019   INR 0.82 10/19/2018   INR 0.88 10/01/2018   Hepatitis Panel No results for input(s): HEPBSAG, HCVAB, HEPAIGM, HEPBIGM in the last 72 hours. C-Diff No components found for: CDIFF Lipase     Component Value Date/Time   LIPASE 20 12/23/2019 0948    Drugs of Abuse  No results found for: LABOPIA, COCAINSCRNUR, LABBENZ, AMPHETMU, THCU, LABBARB   RADIOLOGY STUDIES: CT Chest Wo Contrast  Result Date: 12/23/2019 CLINICAL DATA:  Shortness of breath and hypoxia. Abnormal chest x-ray. EXAM: CT CHEST WITHOUT CONTRAST TECHNIQUE: Multidetector CT imaging of the chest was performed  following the standard protocol without IV contrast. COMPARISON:  One-view chest x-ray 01/22/2019 FINDINGS: Cardiovascular: The heart size is normal. Dense coronary artery calcifications are present. Atherosclerotic calcifications are present at the aortic arch and origins the great vessels. Mediastinum/Nodes: No significant mediastinal axillary, or hilar adenopathy is present. Lungs/Pleura: The lung fields are clear. There is no nodule or mass lesion. The nodular density corresponds with the anterior right rib. Bibasilar airspace disease is present, right greater than left.  No effusions are present. There is no pneumothorax. Upper Abdomen: IV contrast is noted within the renal collecting systems. Limited imaging of the upper abdomen is otherwise unremarkable. Musculoskeletal: Leftward curvature is present in the upper thoracic spine. Vertebral body heights are maintained. Focal lytic or blastic lesions are present. IMPRESSION: 1. The nodular density on chest x-ray corresponds with the anterior right rib. 2. Bibasilar airspace disease, right greater than left. While this may represent atelectasis, infection is not excluded. 3. Coronary artery disease. 4. Aortic Atherosclerosis (ICD10-I70.0). Electronically Signed   By: San Morelle M.D.   On: 12/23/2019 19:22   CT ABDOMEN PELVIS W CONTRAST  Result Date: 12/23/2019 CLINICAL DATA:  Abdominal pain, LEFT side abdominal tenderness, vomiting since 0100 hours, low-grade fever, chills, history of ethanol dependence, GERD, hypertension, irritable bowel syndrome, smoker EXAM: CT ABDOMEN AND PELVIS WITH CONTRAST TECHNIQUE: Multidetector CT imaging of the abdomen and pelvis was performed using the standard protocol following bolus administration of intravenous contrast. Sagittal and coronal MPR images reconstructed from axial data set. CONTRAST:  159m OMNIPAQUE IOHEXOL 300 MG/ML SOLN IV. No oral contrast. COMPARISON:  07/17/2017 FINDINGS: Lower chest: Subsegmental  atelectasis dependently at both lung bases greater on RIGHT Hepatobiliary: Fatty infiltration of liver. Tiny nonspecific low-attenuation focus anterior LEFT lobe likely 5 mm cyst unchanged. Dependent density within gallbladder likely reflects cholelithiasis. Mild pericholecystic edema. Dilated CBD 11 mm diameter. Filling defect within distal CBD axial image 38 highly suspicious for choledocholithiasis. Questionable additional subtle intraluminal density on coronal images. Pancreas: Unremarkable Spleen: Normal appearance Adrenals/Urinary Tract: Adrenal glands normal appearance. Cortical thinning of both kidneys without renal mass or hydronephrosis. No urinary tract calcification or ureteral dilatation. Bladder unremarkable. Stomach/Bowel: Appendix surgically absent by history. Bowel wall thickening of transverse colon compatible with colitis. Remainder of colon unremarkable. Small bowel loops normal appearance. Wall thickening of gastric antrum with slight hyperenhancement of mucosa question gastritis; no discrete ulcer identified. Vascular/Lymphatic: Extensive atherosclerotic calcifications aorta, iliac arteries, femoral arteries, coronary arteries. Aorta normal caliber. No adenopathy. Reproductive: Atrophic uterus and ovaries Other: No free air or free fluid.  No hernia. Musculoskeletal: Osseous demineralization. Orthopedic hardware proximal RIGHT femur. Prior lumbar fusion L4-S1. Degenerative disc disease changes L3-L4. IMPRESSION: Bowel wall thickening of the transverse colon consistent with colitis: consider inflammatory bowel disease and infection. Edema of gastric antrum with hyperenhancement of the mucosa raising question of gastritis, with no discrete ulcer identified. Cholelithiasis with pericholecystic fluid, cannot exclude acute cholecystitis. Extrahepatic biliary dilatation with at least 1 filling defect in distal CBD highly suspicious for choledocholithiasis; recommend correlation with LFTs and consider  MRCP imaging. Fatty infiltration of liver. Bibasilar atelectasis Aortic Atherosclerosis (ICD10-I70.0). Electronically Signed   By: MLavonia DanaM.D.   On: 12/23/2019 14:28   MR 3D Recon At Scanner  Result Date: 12/24/2019 CLINICAL DATA:  Gallstones with biliary dilatation. EXAM: MRI ABDOMEN WITHOUT AND WITH CONTRAST (INCLUDING MRCP) TECHNIQUE: Multiplanar multisequence MR imaging of the abdomen was performed both before and after the administration of intravenous contrast. Heavily T2-weighted images of the biliary and pancreatic ducts were obtained, and three-dimensional MRCP images were rendered by post processing. CONTRAST:  518mGADAVIST GADOBUTROL 1 MMOL/ML IV SOLN COMPARISON:  Abdomen/pelvis CT 12/23/2019. Abdominal ultrasound 12/23/2019. FINDINGS: Lower chest: Dependent atelectasis with tiny bilateral effusions. Hepatobiliary: Tiny cyst noted in the anterior left liver. No suspicious enhancing liver abnormality. Gallbladder is distended with small volume pericholecystic fluid. Several tiny/granular stones are visualized in the gallbladder lumen (image 12 coronal T2 series 12). Common bile duct  in the head of pancreas measures up to 8-9 mm diameter with some dependent sludge and probable tiny dependent stones (coronal T2 image 9 of series 12). Common bile duct inserts low on the descending duodenum. Pancreas: Pancreas divisum ductal anatomy. No evidence for pancreatic mass lesion although assessment on postcontrast imaging is motion degraded. Spleen:  No splenomegaly. No focal mass lesion. Adrenals/Urinary Tract: No adrenal nodule or mass. Tiny T2 hyperintense lesions in the cortex of both kidney show no appreciable enhancement after IV contrast administration, but assessment on postcontrast imaging is markedly motion degraded. Stomach/Bowel: Stomach is unremarkable. No gastric wall thickening. No evidence of outlet obstruction. Duodenum is normally positioned as is the ligament of Treitz. No small bowel or  colonic dilatation within the visualized abdomen. Wall thickening in the transverse colon described on the recent CT scan is not well demonstrated by MR. Vascular/Lymphatic: No abdominal aortic aneurysm. No abdominal lymphadenopathy. Other: Trace perihepatic fluid with trace fluid in the gallbladder fossa. Body wall edema noted. Musculoskeletal: Status post lumbosacral fusion. No suspicious marrow enhancement within the visualized bony anatomy. IMPRESSION: 1. Cholelithiasis with small volume pericholecystic fluid. Mild intra and extrahepatic biliary duct dilatation is associated with several apparent tiny granular stones in the common bile duct. 2. Pancreas divisum anatomy with major papilla low on the descending duodenum. 3. Probable tiny bilateral renal cysts incompletely characterized due to substantial motion degradation on postcontrast imaging today. Electronically Signed   By: Misty Stanley M.D.   On: 12/24/2019 10:43   DG Chest Portable 1 View  Result Date: 12/23/2019 CLINICAL DATA:  Cough and shortness of breath. EXAM: PORTABLE CHEST 1 VIEW COMPARISON:  05/04/2019 FINDINGS: Lungs are hyperexpanded. Interstitial markings are diffusely coarsened with chronic features. Mild patchy atelectasis or infiltrate noted at the bases. No pleural effusion. Nodular density noted right upper lobe, superimposed on the anterior end of the first rib. Cardiopericardial silhouette is at upper limits of normal for size. Bones are diffusely demineralized. Telemetry leads overlie the chest. IMPRESSION: 1. Emphysema with bibasilar atelectasis or infiltrate. 2. Nodular density in the right upper lobe may be related to the anterior right first rib and. Follow-up PA and lateral chest x-ray could be used to further assess. Alternatively, CT chest without contrast could be used to further evaluate as clinically warranted. Electronically Signed   By: Misty Stanley M.D.   On: 12/23/2019 13:43   MR ABDOMEN MRCP W WO  CONTAST  Result Date: 12/24/2019 CLINICAL DATA:  Gallstones with biliary dilatation. EXAM: MRI ABDOMEN WITHOUT AND WITH CONTRAST (INCLUDING MRCP) TECHNIQUE: Multiplanar multisequence MR imaging of the abdomen was performed both before and after the administration of intravenous contrast. Heavily T2-weighted images of the biliary and pancreatic ducts were obtained, and three-dimensional MRCP images were rendered by post processing. CONTRAST:  88m GADAVIST GADOBUTROL 1 MMOL/ML IV SOLN COMPARISON:  Abdomen/pelvis CT 12/23/2019. Abdominal ultrasound 12/23/2019. FINDINGS: Lower chest: Dependent atelectasis with tiny bilateral effusions. Hepatobiliary: Tiny cyst noted in the anterior left liver. No suspicious enhancing liver abnormality. Gallbladder is distended with small volume pericholecystic fluid. Several tiny/granular stones are visualized in the gallbladder lumen (image 12 coronal T2 series 12). Common bile duct in the head of pancreas measures up to 8-9 mm diameter with some dependent sludge and probable tiny dependent stones (coronal T2 image 9 of series 12). Common bile duct inserts low on the descending duodenum. Pancreas: Pancreas divisum ductal anatomy. No evidence for pancreatic mass lesion although assessment on postcontrast imaging is motion degraded. Spleen:  No  splenomegaly. No focal mass lesion. Adrenals/Urinary Tract: No adrenal nodule or mass. Tiny T2 hyperintense lesions in the cortex of both kidney show no appreciable enhancement after IV contrast administration, but assessment on postcontrast imaging is markedly motion degraded. Stomach/Bowel: Stomach is unremarkable. No gastric wall thickening. No evidence of outlet obstruction. Duodenum is normally positioned as is the ligament of Treitz. No small bowel or colonic dilatation within the visualized abdomen. Wall thickening in the transverse colon described on the recent CT scan is not well demonstrated by MR. Vascular/Lymphatic: No abdominal  aortic aneurysm. No abdominal lymphadenopathy. Other: Trace perihepatic fluid with trace fluid in the gallbladder fossa. Body wall edema noted. Musculoskeletal: Status post lumbosacral fusion. No suspicious marrow enhancement within the visualized bony anatomy. IMPRESSION: 1. Cholelithiasis with small volume pericholecystic fluid. Mild intra and extrahepatic biliary duct dilatation is associated with several apparent tiny granular stones in the common bile duct. 2. Pancreas divisum anatomy with major papilla low on the descending duodenum. 3. Probable tiny bilateral renal cysts incompletely characterized due to substantial motion degradation on postcontrast imaging today. Electronically Signed   By: Misty Stanley M.D.   On: 12/24/2019 10:43   US Abdomen Limited RUQ  Result Date: 12/23/2019 CLINICAL DATA:  Abdomen pain and vomiting since last night EXAM: ULTRASOUND ABDOMEN LIMITED RIGHT UPPER QUADRANT COMPARISON:  September 24, 2004 FINDINGS: Gallbladder: Gallstones are identified. There is pericholecystic fluid. No sonographic Percell Miller sign is reported by the ultrasound technologist. The gallbladder wall measures 3.4 mm. Common bile duct: Diameter: 10.7 mm Liver: No focal lesion identified. Within normal limits in parenchymal echogenicity. Portal vein is patent on color Doppler imaging with normal direction of blood flow towards the liver. Other: None. IMPRESSION: Gallstones identified in the gallbladder with pericholecystic fluid. No sonographic Percell Miller sign is reported by the ultrasound technologist. The findings are equivocal for acute cholecystitis. Electronically Signed   By: Abelardo Diesel M.D.   On: 12/23/2019 15:56     IMPRESSION:   *    Nausea with abdominal pain.  Multiple imaging modalities confirm what looks like cholecystitis, choledocholithiasis. Zosyn in place.  *   [name]-D.  Has been flaring for the last 4 months associated with anorexia and weight loss.  Current CT shows thickening in the  transverse colon suggesting colitis.  Has had previous colonoscopies which were unrevealing, the latest of these with random colon biopsies was 03/2016.  *    Chronic pain syndrome.  On MS Contin and oxycodone regularly.  Followed by pain management specialist   *  Covid 19 negative.    PLAN:     *   Will scheduled ERCP for tomorrow.  The procedure technicalities, risks, benefits discussed with patient. Not on any DVT prevention meds.    *   Patient is actually a bit hungry and would like to try eating something light, the idea of ice cream but feels to her so I ordered full liquids but she needs n.p.o. after midnight   Azucena Freed  12/24/2019, 12:17 PM Phone [phone]    Attending physician's note   I have taken an interval history, reviewed the chart and examined the patient. I agree with the Advanced Practitioner's note, impression and recommendations.   Biliary colic d/t CBD stones with normal LFTs.  No ascending cholangitis or pancreatitis. ?  Acute cholecystitis Incidental pancreas divisum on MRCP. IBD-D Chronic pain syndrome on narcotics Very poor historian.  Plan: -ERCP in AM.  I have discussed risks and benefits.  I  am not so sure how much she will retain.  May have to explain it again before procedure tomorrow. -Continue Unasyn. -Would need lap chole thereafter.  Surgery following.    Carmell Austria, MD Velora Heckler Fabienne Bruns [phone].

## 2019-12-24 NOTE — H&P (View-Only) (Signed)
[facility] Consult: 12:17 PM 12/24/2019  LOS: 1 day    Referring Provider: Dr Marthenia Rolling  Primary Care Physician:  Biagio Borg, MD Primary Gastroenterologist:  Dr. Carlean Purl    Reason for Consultation: Choledocholithiasis.   HPI: Denise King is a 68 y.o. female.  See list below for patient's past medical history but significant or her history of [name] D and chronic pain syndrome, chronic narcotics. Several endoscopies and colonoscopies over the last 37 years.   Latest EGD 01/2014.  For diarrhea, positive antigliadin antibody, question celiac disease.  Antral gastritis (ulcerated gastric body and antral type mucosa).  Diminutive antral polyp removed (foveolar hyperplasia).  Otherwise normal studies.  Biopsies obtained of the gastric antrum and duodenum (No villous atrophy or other SB abnormalities.  No H. pylori, metaplasia, dysplasia, malignancy). Latest colonoscopy 03/2016 for diarrhea.  4 mm ascending colon polyp (tubular adenoma, no HGD) o/w normal colonic mucosa.  Random biopsies of colon (all normal).  Normal examined terminal ileum.    Chronic diarrhea again now for the last 4 months with anorexia.  Weight is down about 11 pounds.  She takes bismuth occasionally which helps but does not stop the diarrhea.  She is afraid to take bismuth on a regular basis due to labeling on the bottle though Dr. Carlean Purl has informed her it is okay to use bismuth.    Starting several hours after Christmas dinner she developed nausea and vomiting of partially digested food and then just bile but no blood.  No abdominal pain.  No fevers or sweats.  She says her family became concerned yesterday when she seemed less responsive and was not breathing that well.  EMS came to the house and suggested she go to the ED.  Her husband  brought her to the ED. Initial charting mentions that she was having some mid abdominal pain.  T bili 0.9.  Alk phos 80, AST/ALT 20/9. Potassium 3.2.  Normal renal function. Normal Hb 14.4.  Macrocytosis at 102.5. COVID-19 negative.  CTAP w contrast: Transverse bowel wall thickening consistent with colitis.  Edema and hyper enhancement of mucosa in the gastric antrum, ?  Gastritis.  Cholelithiasis, pericholecystic fluid.  11 mm diameter CBD with at least one filling defect in distal duct.  Fatty liver. Abdominal ultrasound: Gallstones, pericholecystic fluid, no sonographic Murphy sign.  Findings equivocal for acute cholecystitis. MRI/MRCP: Cholelithiasis, small pericholecystic fluid.  Mild intra and extrahepatic biliary ductal dilatation with several small granular stones in CBD.  CBD diameter 8 to 9 mm..  Pancreas divisum anatomy with major papilla low on descending duodenum.  Probable tiny renal cysts.  Fm Hx colon cancer in brother and father.  Drug and alcohol abuse, bipolar disorder in daughter.  Heart disease in mother.  Prostate cancer father, skin cancer sister.. Daughter status post cholecystectomy age 54.  Social history.  Disabled with chronic pain as well as due to her diarrhea and associated fecal incontinence.  She smokes 1 pack cigarettes a day.  Drinks [place] wine a glass or 2, 7 times a month  Past Medical History:  Diagnosis Date  . Acute cystitis   . Acute respiratory failure ([place])   . Allergy    as a child grew out of them  . Anemia    pernicious anemia  . Anxiety   . Arthritis   . Back pain   . Blood transfusion   . CAP (community acquired pneumonia)   . Chronic pain syndrome   . Depression   . Diverticulosis of colon   . Dysthymia   . Esophagitis   . EtOH dependence ([place])   . Gastritis   . GERD (gastroesophageal reflux disease)   . H/O chest pain Dec. 2013   no work up done  . Hx of colonic polyps   . Hypertension   . Irritable bowel syndrome   .  Osteopenia   . Osteoporosis   . Pneumonia 2019  . Polypharmacy   . Reflux esophagitis   . Right sided sciatica 12/22/2017  . Tobacco use disorder   . Unspecified chronic bronchitis ([place])   . Vertigo   . Vitamin B12 deficiency   . Wears glasses     Past Surgical History:  Procedure Laterality Date  . APPENDECTOMY    . BACK SURGERY  10-09   Dr. Patrice Paradise  . CARPAL TUNNEL RELEASE Right 08/14/2014   Procedure: RIGHT CARPAL TUNNEL RELEASE AND INJECT LEFT THUMB;  Surgeon: Daryll Brod, MD;  Location: [facility];  Service: Orthopedics;  Laterality: Right;  . COLONOSCOPY    . INTRAMEDULLARY (IM) NAIL INTERTROCHANTERIC Right 10/01/2018   Procedure: INTRAMEDULLARY (IM) NAIL INTERTROCHANTRIC;  Surgeon: Thornton Park, MD;  Location: [facility];  Service: Orthopedics;  Laterality: Right;  . LAPAROSCOPY N/A 02/21/2013   Procedure: LAPAROSCOPY OPERATIVE;  Surgeon: Margarette Asal, MD;  Location: [facility];  Service: Gynecology;  Laterality: N/A;  REQUESTING 5MM SCOPE WITH CAMERA  . LEFT HEART CATH AND CORONARY ANGIOGRAPHY N/A 05/11/2019   Procedure: LEFT HEART CATH AND CORONARY ANGIOGRAPHY;  Surgeon: Sherren Mocha, MD;  Location: [facility];  Service: Cardiovascular;  Laterality: N/A;  . TONSILLECTOMY    . TUBAL LIGATION    . UPPER GASTROINTESTINAL ENDOSCOPY      Prior to Admission medications   Medication Sig Start Date End Date Taking? Authorizing Provider  ARIPiprazole (ABILIFY) 2 MG tablet Take 1 tablet (2 mg total) by mouth daily. Patient taking differently: Take 2 mg by mouth at bedtime.  12/19/19 02/17/20 Yes Pucilowski, Marchia Bond, MD  aspirin 81 MG chewable tablet Chew 1 tablet (81 mg total) by mouth daily. 04/12/19  Yes Sheikh, Omair Latif, DO  busPIRone (BUSPAR) 10 MG tablet Take 1 tablet (10 mg total) by mouth 3 (three) times daily. 12/19/19  Yes Pucilowski, Olgierd A, MD  Cholecalciferol (VITAMIN D) 50 MCG (2000 UT) tablet Take 2,000 Units by mouth daily after lunch.    Yes [provider]  cholestyramine (QUESTRAN) 4 GM/DOSE powder Take 1 packet (4 g total) by mouth 2 (two) times daily with a meal. Patient taking differently: Take 4 g by mouth 2 (two) times daily as needed ([name] (take with meals)).  03/24/19  Yes Gatha Mayer, MD  denosumab (PROLIA) 60 MG/ML SOSY injection Inject 60 mg into the skin every 6 (six) months.   Yes [provider]  diphenhydramine-acetaminophen (TYLENOL PM) 25-500 MG TABS tablet Take 2 tablets by mouth at bedtime as needed (sleep).   Yes [provider]  doxylamine, Sleep, (UNISOM) 25 MG tablet Take 25 mg by mouth  at bedtime.    Yes [provider]  folic acid (FOLVITE) 1 MG tablet Take 1 tablet (1 mg total) by mouth daily. Patient taking differently: Take 1 mg by mouth daily after lunch.  09/05/18  Yes Biagio Borg, MD  gabapentin (NEURONTIN) 300 MG capsule Take 300 mg by mouth 3 (three) times daily.   Yes [provider]  hydrOXYzine (VISTARIL) 25 MG capsule Take 1 capsule (25 mg total) by mouth every 8 (eight) hours as needed for anxiety. 12/19/19 03/18/20 Yes Pucilowski, Olgierd A, MD  hyoscyamine (ANASPAZ) 0.125 MG TBDP disintergrating tablet Place 0.125 mg under the tongue every 6 (six) hours as needed ([name] cramps).    Yes [provider]  morphine (MS CONTIN) 60 MG 12 hr tablet Take 60 mg by mouth every 12 (twelve) hours. 05/02/19  Yes [provider]  Oxycodone HCl 10 MG TABS Take 10 mg by mouth 4 (four) times daily.  03/06/19  Yes [provider]  pantoprazole (PROTONIX) 40 MG tablet Take 1 tablet (40 mg total) by mouth daily. Patient taking differently: Take 40 mg by mouth at bedtime.  04/12/19  Yes Sheikh, Cornell, DO  rosuvastatin (CRESTOR) 5 MG tablet Take 1 tablet (5 mg total) by mouth every Monday, Wednesday, and Friday at 6 PM. 08/30/19 01/28/20 Yes Josue Hector, MD  sertraline (ZOLOFT) 100 MG tablet Take 2 tablets (200 mg total) by mouth at bedtime.  12/19/19 03/18/20 Yes Pucilowski, Olgierd A, MD  triamcinolone cream (KENALOG) 0.1 % Apply 1 application topically 2 (two) times daily as needed (itching).  03/14/19  Yes [provider]  valACYclovir (VALTREX) 500 MG tablet Take 500 mg by mouth 2 (two) times daily as needed (flare ups).   Yes [provider]  vitamin B-12 (CYANOCOBALAMIN) 100 MCG tablet Take 1 tablet (100 mcg total) by mouth daily. Patient taking differently: Take 100 mcg by mouth daily after lunch.  11/03/18  Yes Biagio Borg, MD    Scheduled Meds: . ARIPiprazole  2 mg Oral QHS  . busPIRone  10 mg Oral TID  . cholecalciferol  2,000 Units Oral QPC lunch  . folic acid  1 mg Oral QPC lunch  . gabapentin  300 mg Oral TID  . morphine  60 mg Oral Q12H  . mupirocin ointment  1 application Nasal BID  . pantoprazole  40 mg Oral QHS  . [START ON 12/25/2019] rosuvastatin  5 mg Oral Q M,W,F-1800  . sertraline  200 mg Oral QHS  . vitamin B-12  100 mcg Oral QPC lunch   Infusions: . sodium chloride 50 mL/hr at 12/23/19 2029  . ampicillin-sulbactam (UNASYN) IV 3 g (12/24/19 1053)   PRN Meds: acetaminophen **OR** acetaminophen, cholestyramine, gadobutrol, hydrOXYzine, ondansetron **OR** ondansetron (ZOFRAN) IV, oxyCODONE, valACYclovir   Allergies as of 12/23/2019 - Review Complete 12/23/2019  Allergen Reaction Noted  . Atorvastatin Nausea And Vomiting   . Levofloxacin Other (See Comments) 04/13/2011  . Omnicef [cefdinir] Diarrhea 02/13/2013  . Trazodone and nefazodone Other (See Comments) 09/21/2011  . Sulfa antibiotics Rash 04/07/2019  . Sulfonamide derivatives Rash     Family History  Problem Relation Age of Onset  . Colon cancer Father   . Prostate cancer Father   . Heart disease Mother        prev MVR, also has DJD  . Colon cancer Brother   . Skin cancer Sister   . Alcohol abuse Daughter   . Bipolar disorder Daughter   . Drug  abuse Daughter   . Esophageal cancer Neg Hx   . Rectal cancer Neg Hx     . Stomach cancer Neg Hx     Social History   Socioeconomic History  . Marital status: Married    Spouse name: Not on file  . Number of children: 4  . Years of education: 7  . Highest education level: Not on file  Occupational History  . Occupation: retired    Comment: disablility  Tobacco Use  . Smoking status: Current Every Day Smoker    Packs/day: 1.00    Years: 30.00    Pack years: 30.00    Types: Cigarettes  . Smokeless tobacco: Never Used  Substance and Sexual Activity  . Alcohol use: Yes    Alcohol/week: 3.0 standard drinks    Types: 3 Standard drinks or equivalent per week    Comment: occasional  . Drug use: No  . Sexual activity: Yes  Other Topics Concern  . Not on file  Social History Narrative   Married x 38 yrs. Has BS degree in Horticulture from [place] [employer]. Currently resides in a house with her husband. 3 cats.  Fun: used to like to do a lot of things.    Denies religious beliefs that would effect health care.    lives with husband [name]; quit smoking- October 6th, 2019. ocasional alcohol. [employer] [2009] on disability sec to back issues.    Social Determinants of Health   Financial Resource Strain: Low Risk   . Difficulty of Paying Living Expenses: Not very hard  Food Insecurity: No Food Insecurity  . Worried About Charity fundraiser in the Last Year: Never true  . Ran Out of Food in the Last Year: Never true  Transportation Needs: No Transportation Needs  . Lack of Transportation (Medical): No  . Lack of Transportation (Non-Medical): No  Physical Activity: Inactive  . Days of Exercise per Week: 0 days  . Minutes of Exercise per Session: 0 min  Stress: Stress Concern Present  . Feeling of Stress : Rather much  Social Connections: Somewhat Isolated  . Frequency of Communication with Friends and Family: More than three times a week  . Frequency of Social Gatherings with Friends and Family: Once a week  . Attends Religious Services:  Never  . Active Member of Clubs or Organizations: No  . Attends Archivist Meetings: Never  . Marital Status: Married  Human resources officer Violence: Not At Risk  . Fear of Current or Ex-Partner: No  . Emotionally Abused: No  . Physically Abused: No  . Sexually Abused: No    REVIEW OF SYSTEMS: Constitutional: Weakness, fatigue. ENT:  No nose bleeds Pulm: Clear sputum with cough.  Smokes a pack of cigarettes a day.  No dyspnea. CV:  No palpitations, no LE edema.  No angina. GU:  No hematuria, no frequency.  No dark-colored urine. GI: See HPI. Heme: No unusual bleeding or bruising Transfusions: She does not recall prior transfusion with blood products. Neuro:  No headaches, no peripheral tingling or numbness Derm:  No itching, no rash or sores.  Endocrine:  No sweats or chills.  No polyuria or dysuria Immunization: Vaccination history reviewed, received flu shot in 08/2019 Travel:  None   PHYSICAL EXAM: Vital signs in last 24 hours: Vitals:   12/24/19 0500 12/24/19 0742  BP:  116/63  Pulse:  83  Resp: 16   Temp:  98.8 F (37.1 C)  SpO2:  93%   Wt Readings  from Last 3 Encounters:  12/23/19 48.5 kg  09/11/19 51.7 kg  09/10/19 51.3 kg    General: Thin, chronically ill-appearing. Head: No facial asymmetry or swelling.  No signs of head trauma. Eyes: No scleral icterus, no conjunctival pallor.  EOMI. Ears: No hearing loss Nose: Congestion but no drainage. Mouth: Moist, clear, pink oral mucosa.  Tongue midline.  Good dentition. Neck: No JVD, no masses, no thyromegaly. Lungs: Clear bilaterally.  Active cough with sounds of congested sputum. Heart: RRR.  No MRG.  S1, S2 present. Abdomen: Soft.  Slight tenderness in the right upper abdomen without guarding or rebound.  Active bowel sounds.  No distention.  No HSM, bruits, masses, hernias.   Rectal: Deferred Musc/Skeltl: No joint redness swelling or gross deformity. Extremities: No CCE. Neurologic: Oriented x3.   Drowsy but arousable.  Moves all 4 limbs.  No tremors. Skin: No rash, no sores, no large bruises. Tattoos: None observed Nodes: No inguinal or cervical adenopathy Psych: Flat affect, cooperative.    Intake/Output from previous day: 12/26 0701 - 12/27 0700 In: 1000 [IV Piggyback:1000] Out: -  Intake/Output this shift: Total I/O In: 114 [P.O.:114] Out: -   LAB RESULTS: Recent Labs    12/23/19 0948 12/24/19 0500  WBC 6.4 6.0  HGB 15.8* 14.4  HCT 49.9* 45.6  PLT 181 153   BMET Lab Results  Component Value Date   NA 138 12/24/2019   NA 135 12/23/2019   NA 134 (L) 09/10/2019   K 3.2 (L) 12/24/2019   K 3.9 12/23/2019   K 3.9 09/10/2019   CL 99 12/24/2019   CL 92 (L) 12/23/2019   CL 95 (L) 09/10/2019   CO2 31 12/24/2019   CO2 32 12/23/2019   CO2 25 09/10/2019   GLUCOSE 83 12/24/2019   GLUCOSE 121 (H) 12/23/2019   GLUCOSE 90 09/10/2019   BUN <5 (L) 12/24/2019   BUN <5 (L) 12/23/2019   BUN 7 (L) 09/10/2019   CREATININE 0.71 12/24/2019   CREATININE 0.85 12/23/2019   CREATININE 0.85 09/10/2019   CALCIUM 8.0 (L) 12/24/2019   CALCIUM 8.6 (L) 12/23/2019   CALCIUM 8.9 09/10/2019   LFT Recent Labs    12/23/19 0948 12/24/19 0500  PROT 5.8* 5.0*  ALBUMIN 3.3* 2.6*  AST 36 20  ALT 12 9  ALKPHOS 97 80  BILITOT 0.5 0.9   PT/INR Lab Results  Component Value Date   INR 0.9 09/10/2019   INR 0.82 10/19/2018   INR 0.88 10/01/2018   Hepatitis Panel No results for input(s): HEPBSAG, HCVAB, HEPAIGM, HEPBIGM in the last 72 hours. C-Diff No components found for: CDIFF Lipase     Component Value Date/Time   LIPASE 20 12/23/2019 0948    Drugs of Abuse  No results found for: LABOPIA, COCAINSCRNUR, LABBENZ, AMPHETMU, THCU, LABBARB   RADIOLOGY STUDIES: CT Chest Wo Contrast  Result Date: 12/23/2019 CLINICAL DATA:  Shortness of breath and hypoxia. Abnormal chest x-ray. EXAM: CT CHEST WITHOUT CONTRAST TECHNIQUE: Multidetector CT imaging of the chest was performed  following the standard protocol without IV contrast. COMPARISON:  One-view chest x-ray 01/22/2019 FINDINGS: Cardiovascular: The heart size is normal. Dense coronary artery calcifications are present. Atherosclerotic calcifications are present at the aortic arch and origins the great vessels. Mediastinum/Nodes: No significant mediastinal axillary, or hilar adenopathy is present. Lungs/Pleura: The lung fields are clear. There is no nodule or mass lesion. The nodular density corresponds with the anterior right rib. Bibasilar airspace disease is present, right greater than left.  No effusions are present. There is no pneumothorax. Upper Abdomen: IV contrast is noted within the renal collecting systems. Limited imaging of the upper abdomen is otherwise unremarkable. Musculoskeletal: Leftward curvature is present in the upper thoracic spine. Vertebral body heights are maintained. Focal lytic or blastic lesions are present. IMPRESSION: 1. The nodular density on chest x-ray corresponds with the anterior right rib. 2. Bibasilar airspace disease, right greater than left. While this may represent atelectasis, infection is not excluded. 3. Coronary artery disease. 4. Aortic Atherosclerosis (ICD10-I70.0). Electronically Signed   By: San Morelle M.D.   On: 12/23/2019 19:22   CT ABDOMEN PELVIS W CONTRAST  Result Date: 12/23/2019 CLINICAL DATA:  Abdominal pain, LEFT side abdominal tenderness, vomiting since 0100 hours, low-grade fever, chills, history of ethanol dependence, GERD, hypertension, irritable bowel syndrome, smoker EXAM: CT ABDOMEN AND PELVIS WITH CONTRAST TECHNIQUE: Multidetector CT imaging of the abdomen and pelvis was performed using the standard protocol following bolus administration of intravenous contrast. Sagittal and coronal MPR images reconstructed from axial data set. CONTRAST:  127m OMNIPAQUE IOHEXOL 300 MG/ML SOLN IV. No oral contrast. COMPARISON:  07/17/2017 FINDINGS: Lower chest: Subsegmental  atelectasis dependently at both lung bases greater on RIGHT Hepatobiliary: Fatty infiltration of liver. Tiny nonspecific low-attenuation focus anterior LEFT lobe likely 5 mm cyst unchanged. Dependent density within gallbladder likely reflects cholelithiasis. Mild pericholecystic edema. Dilated CBD 11 mm diameter. Filling defect within distal CBD axial image 38 highly suspicious for choledocholithiasis. Questionable additional subtle intraluminal density on coronal images. Pancreas: Unremarkable Spleen: Normal appearance Adrenals/Urinary Tract: Adrenal glands normal appearance. Cortical thinning of both kidneys without renal mass or hydronephrosis. No urinary tract calcification or ureteral dilatation. Bladder unremarkable. Stomach/Bowel: Appendix surgically absent by history. Bowel wall thickening of transverse colon compatible with colitis. Remainder of colon unremarkable. Small bowel loops normal appearance. Wall thickening of gastric antrum with slight hyperenhancement of mucosa question gastritis; no discrete ulcer identified. Vascular/Lymphatic: Extensive atherosclerotic calcifications aorta, iliac arteries, femoral arteries, coronary arteries. Aorta normal caliber. No adenopathy. Reproductive: Atrophic uterus and ovaries Other: No free air or free fluid.  No hernia. Musculoskeletal: Osseous demineralization. Orthopedic hardware proximal RIGHT femur. Prior lumbar fusion L4-S1. Degenerative disc disease changes L3-L4. IMPRESSION: Bowel wall thickening of the transverse colon consistent with colitis: consider inflammatory bowel disease and infection. Edema of gastric antrum with hyperenhancement of the mucosa raising question of gastritis, with no discrete ulcer identified. Cholelithiasis with pericholecystic fluid, cannot exclude acute cholecystitis. Extrahepatic biliary dilatation with at least 1 filling defect in distal CBD highly suspicious for choledocholithiasis; recommend correlation with LFTs and consider  MRCP imaging. Fatty infiltration of liver. Bibasilar atelectasis Aortic Atherosclerosis (ICD10-I70.0). Electronically Signed   By: MLavonia DanaM.D.   On: 12/23/2019 14:28   MR 3D Recon At Scanner  Result Date: 12/24/2019 CLINICAL DATA:  Gallstones with biliary dilatation. EXAM: MRI ABDOMEN WITHOUT AND WITH CONTRAST (INCLUDING MRCP) TECHNIQUE: Multiplanar multisequence MR imaging of the abdomen was performed both before and after the administration of intravenous contrast. Heavily T2-weighted images of the biliary and pancreatic ducts were obtained, and three-dimensional MRCP images were rendered by post processing. CONTRAST:  568mGADAVIST GADOBUTROL 1 MMOL/ML IV SOLN COMPARISON:  Abdomen/pelvis CT 12/23/2019. Abdominal ultrasound 12/23/2019. FINDINGS: Lower chest: Dependent atelectasis with tiny bilateral effusions. Hepatobiliary: Tiny cyst noted in the anterior left liver. No suspicious enhancing liver abnormality. Gallbladder is distended with small volume pericholecystic fluid. Several tiny/granular stones are visualized in the gallbladder lumen (image 12 coronal T2 series 12). Common bile duct  in the head of pancreas measures up to 8-9 mm diameter with some dependent sludge and probable tiny dependent stones (coronal T2 image 9 of series 12). Common bile duct inserts low on the descending duodenum. Pancreas: Pancreas divisum ductal anatomy. No evidence for pancreatic mass lesion although assessment on postcontrast imaging is motion degraded. Spleen:  No splenomegaly. No focal mass lesion. Adrenals/Urinary Tract: No adrenal nodule or mass. Tiny T2 hyperintense lesions in the cortex of both kidney show no appreciable enhancement after IV contrast administration, but assessment on postcontrast imaging is markedly motion degraded. Stomach/Bowel: Stomach is unremarkable. No gastric wall thickening. No evidence of outlet obstruction. Duodenum is normally positioned as is the ligament of Treitz. No small bowel or  colonic dilatation within the visualized abdomen. Wall thickening in the transverse colon described on the recent CT scan is not well demonstrated by MR. Vascular/Lymphatic: No abdominal aortic aneurysm. No abdominal lymphadenopathy. Other: Trace perihepatic fluid with trace fluid in the gallbladder fossa. Body wall edema noted. Musculoskeletal: Status post lumbosacral fusion. No suspicious marrow enhancement within the visualized bony anatomy. IMPRESSION: 1. Cholelithiasis with small volume pericholecystic fluid. Mild intra and extrahepatic biliary duct dilatation is associated with several apparent tiny granular stones in the common bile duct. 2. Pancreas divisum anatomy with major papilla low on the descending duodenum. 3. Probable tiny bilateral renal cysts incompletely characterized due to substantial motion degradation on postcontrast imaging today. Electronically Signed   By: Misty Stanley M.D.   On: 12/24/2019 10:43   DG Chest Portable 1 View  Result Date: 12/23/2019 CLINICAL DATA:  Cough and shortness of breath. EXAM: PORTABLE CHEST 1 VIEW COMPARISON:  05/04/2019 FINDINGS: Lungs are hyperexpanded. Interstitial markings are diffusely coarsened with chronic features. Mild patchy atelectasis or infiltrate noted at the bases. No pleural effusion. Nodular density noted right upper lobe, superimposed on the anterior end of the first rib. Cardiopericardial silhouette is at upper limits of normal for size. Bones are diffusely demineralized. Telemetry leads overlie the chest. IMPRESSION: 1. Emphysema with bibasilar atelectasis or infiltrate. 2. Nodular density in the right upper lobe may be related to the anterior right first rib and. Follow-up PA and lateral chest x-ray could be used to further assess. Alternatively, CT chest without contrast could be used to further evaluate as clinically warranted. Electronically Signed   By: Misty Stanley M.D.   On: 12/23/2019 13:43   MR ABDOMEN MRCP W WO  CONTAST  Result Date: 12/24/2019 CLINICAL DATA:  Gallstones with biliary dilatation. EXAM: MRI ABDOMEN WITHOUT AND WITH CONTRAST (INCLUDING MRCP) TECHNIQUE: Multiplanar multisequence MR imaging of the abdomen was performed both before and after the administration of intravenous contrast. Heavily T2-weighted images of the biliary and pancreatic ducts were obtained, and three-dimensional MRCP images were rendered by post processing. CONTRAST:  88m GADAVIST GADOBUTROL 1 MMOL/ML IV SOLN COMPARISON:  Abdomen/pelvis CT 12/23/2019. Abdominal ultrasound 12/23/2019. FINDINGS: Lower chest: Dependent atelectasis with tiny bilateral effusions. Hepatobiliary: Tiny cyst noted in the anterior left liver. No suspicious enhancing liver abnormality. Gallbladder is distended with small volume pericholecystic fluid. Several tiny/granular stones are visualized in the gallbladder lumen (image 12 coronal T2 series 12). Common bile duct in the head of pancreas measures up to 8-9 mm diameter with some dependent sludge and probable tiny dependent stones (coronal T2 image 9 of series 12). Common bile duct inserts low on the descending duodenum. Pancreas: Pancreas divisum ductal anatomy. No evidence for pancreatic mass lesion although assessment on postcontrast imaging is motion degraded. Spleen:  No  splenomegaly. No focal mass lesion. Adrenals/Urinary Tract: No adrenal nodule or mass. Tiny T2 hyperintense lesions in the cortex of both kidney show no appreciable enhancement after IV contrast administration, but assessment on postcontrast imaging is markedly motion degraded. Stomach/Bowel: Stomach is unremarkable. No gastric wall thickening. No evidence of outlet obstruction. Duodenum is normally positioned as is the ligament of Treitz. No small bowel or colonic dilatation within the visualized abdomen. Wall thickening in the transverse colon described on the recent CT scan is not well demonstrated by MR. Vascular/Lymphatic: No abdominal  aortic aneurysm. No abdominal lymphadenopathy. Other: Trace perihepatic fluid with trace fluid in the gallbladder fossa. Body wall edema noted. Musculoskeletal: Status post lumbosacral fusion. No suspicious marrow enhancement within the visualized bony anatomy. IMPRESSION: 1. Cholelithiasis with small volume pericholecystic fluid. Mild intra and extrahepatic biliary duct dilatation is associated with several apparent tiny granular stones in the common bile duct. 2. Pancreas divisum anatomy with major papilla low on the descending duodenum. 3. Probable tiny bilateral renal cysts incompletely characterized due to substantial motion degradation on postcontrast imaging today. Electronically Signed   By: Misty Stanley M.D.   On: 12/24/2019 10:43   US Abdomen Limited RUQ  Result Date: 12/23/2019 CLINICAL DATA:  Abdomen pain and vomiting since last night EXAM: ULTRASOUND ABDOMEN LIMITED RIGHT UPPER QUADRANT COMPARISON:  September 24, 2004 FINDINGS: Gallbladder: Gallstones are identified. There is pericholecystic fluid. No sonographic Percell Miller sign is reported by the ultrasound technologist. The gallbladder wall measures 3.4 mm. Common bile duct: Diameter: 10.7 mm Liver: No focal lesion identified. Within normal limits in parenchymal echogenicity. Portal vein is patent on color Doppler imaging with normal direction of blood flow towards the liver. Other: None. IMPRESSION: Gallstones identified in the gallbladder with pericholecystic fluid. No sonographic Percell Miller sign is reported by the ultrasound technologist. The findings are equivocal for acute cholecystitis. Electronically Signed   By: Abelardo Diesel M.D.   On: 12/23/2019 15:56     IMPRESSION:   *    Nausea with abdominal pain.  Multiple imaging modalities confirm what looks like cholecystitis, choledocholithiasis. Zosyn in place.  *   [name]-D.  Has been flaring for the last 4 months associated with anorexia and weight loss.  Current CT shows thickening in the  transverse colon suggesting colitis.  Has had previous colonoscopies which were unrevealing, the latest of these with random colon biopsies was 03/2016.  *    Chronic pain syndrome.  On MS Contin and oxycodone regularly.  Followed by pain management specialist   *  Covid 19 negative.    PLAN:     *   Will scheduled ERCP for tomorrow.  The procedure technicalities, risks, benefits discussed with patient. Not on any DVT prevention meds.    *   Patient is actually a bit hungry and would like to try eating something light, the idea of ice cream but feels to her so I ordered full liquids but she needs n.p.o. after midnight   Azucena Freed  12/24/2019, 12:17 PM Phone [phone]    Attending physician's note   I have taken an interval history, reviewed the chart and examined the patient. I agree with the Advanced Practitioner's note, impression and recommendations.   Biliary colic d/t CBD stones with normal LFTs.  No ascending cholangitis or pancreatitis. ?  Acute cholecystitis Incidental pancreas divisum on MRCP. IBD-D Chronic pain syndrome on narcotics Very poor historian.  Plan: -ERCP in AM.  I have discussed risks and benefits.  I  am not so sure how much she will retain.  May have to explain it again before procedure tomorrow. -Continue Unasyn. -Would need lap chole thereafter.  Surgery following.    Carmell Austria, MD Velora Heckler Fabienne Bruns [phone].

## 2019-12-24 NOTE — Plan of Care (Signed)

## 2019-12-25 ENCOUNTER — Inpatient Hospital Stay (HOSPITAL_COMMUNITY): Payer: Medicare HMO

## 2019-12-25 ENCOUNTER — Inpatient Hospital Stay (HOSPITAL_COMMUNITY): Payer: Medicare HMO | Admitting: Certified Registered Nurse Anesthetist

## 2019-12-25 ENCOUNTER — Encounter (HOSPITAL_COMMUNITY): Payer: Self-pay | Admitting: Internal Medicine

## 2019-12-25 ENCOUNTER — Encounter (HOSPITAL_COMMUNITY)
Admission: EM | Disposition: A | Discharge status: Home Health | Payer: Self-pay | Source: Home / Self Care | Attending: Internal Medicine

## 2019-12-25 DIAGNOSIS — R935 Abnormal findings on diagnostic imaging of other abdominal regions, including retroperitoneum: Secondary | ICD-10-CM | POA: Diagnosis present

## 2019-12-25 DIAGNOSIS — J9602 Acute respiratory failure with hypercapnia: Secondary | ICD-10-CM

## 2019-12-25 DIAGNOSIS — K8020 Calculus of gallbladder without cholecystitis without obstruction: Secondary | ICD-10-CM | POA: Diagnosis present

## 2019-12-25 HISTORY — PX: SPHINCTEROTOMY: SHX5544

## 2019-12-25 HISTORY — PX: ENDOSCOPIC RETROGRADE CHOLANGIOPANCREATOGRAPHY (ERCP) WITH PROPOFOL: SHX5810

## 2019-12-25 HISTORY — PX: REMOVAL OF STONES: SHX5545

## 2019-12-25 LAB — POCT I-STAT 7, (LYTES, BLD GAS, ICA,H+H)
Acid-Base Excess: 3 mmol/L — ABNORMAL HIGH (ref 0.0–2.0)
Acid-Base Excess: 5 mmol/L — ABNORMAL HIGH (ref 0.0–2.0)
Bicarbonate: 36.1 mmol/L — ABNORMAL HIGH (ref 20.0–28.0)
Bicarbonate: 33.4 mmol/L — ABNORMAL HIGH (ref 20.0–28.0)
Calcium, Ion: 1.16 mmol/L (ref 1.15–1.40)
Calcium, Ion: 1.12 mmol/L — ABNORMAL LOW (ref 1.15–1.40)
HCT: 54 % — ABNORMAL HIGH (ref 36.0–46.0)
HCT: 49 % — ABNORMAL HIGH (ref 36.0–46.0)
Hemoglobin: 18.4 g/dL — ABNORMAL HIGH (ref 12.0–15.0)
Hemoglobin: 16.7 g/dL — ABNORMAL HIGH (ref 12.0–15.0)
O2 Saturation: 86 %
O2 Saturation: 95 %
Potassium: 3.8 mmol/L (ref 3.5–5.1)
Potassium: 3.9 mmol/L (ref 3.5–5.1)
Sodium: 138 mmol/L (ref 135–145)
Sodium: 140 mmol/L (ref 135–145)
TCO2: 39 mmol/L — ABNORMAL HIGH (ref 22–32)
TCO2: 35 mmol/L — ABNORMAL HIGH (ref 22–32)
pCO2 arterial: 96 mmHg (ref 32.0–48.0)
pCO2 arterial: 59.8 mmHg — ABNORMAL HIGH (ref 32.0–48.0)
pH, Arterial: 7.183 — CL (ref 7.350–7.450)
pH, Arterial: 7.355 (ref 7.350–7.450)
pO2, Arterial: 68 mmHg — ABNORMAL LOW (ref 83.0–108.0)
pO2, Arterial: 81 mmHg — ABNORMAL LOW (ref 83.0–108.0)

## 2019-12-25 LAB — MAGNESIUM: Magnesium: 1.5 mg/dL — ABNORMAL LOW (ref 1.7–2.4)

## 2019-12-25 LAB — LACTIC ACID, PLASMA: Lactic Acid, Venous: 1.4 mmol/L (ref 0.5–1.9)

## 2019-12-25 LAB — MRSA PCR SCREENING: MRSA by PCR: NEGATIVE

## 2019-12-25 SURGERY — ENDOSCOPIC RETROGRADE CHOLANGIOPANCREATOGRAPHY (ERCP) WITH PROPOFOL
Anesthesia: General

## 2019-12-25 MED ORDER — GLUCAGON HCL RDNA (DIAGNOSTIC) 1 MG IJ SOLR
INTRAMUSCULAR | Status: DC | PRN
  Administered 2019-12-25 (×2): .25 mg via INTRAVENOUS

## 2019-12-25 MED ORDER — ORAL CARE MOUTH RINSE
15.0000 mL | Freq: Two times a day (BID) | OROMUCOSAL | Status: DC
  Administered 2019-12-25 – 2020-01-07 (×15): 15 mL via OROMUCOSAL

## 2019-12-25 MED ORDER — EPHEDRINE SULFATE-NACL 50-0.9 MG/10ML-% IV SOSY
PREFILLED_SYRINGE | INTRAVENOUS | Status: DC | PRN
  Administered 2019-12-25: 10 mg via INTRAVENOUS

## 2019-12-25 MED ORDER — DEXAMETHASONE SODIUM PHOSPHATE 10 MG/ML IJ SOLN
INTRAMUSCULAR | Status: DC | PRN
  Administered 2019-12-25: 4 mg via INTRAVENOUS

## 2019-12-25 MED ORDER — ONDANSETRON HCL 4 MG/2ML IJ SOLN
INTRAMUSCULAR | Status: DC | PRN
  Administered 2019-12-25: 4 mg via INTRAVENOUS

## 2019-12-25 MED ORDER — ONDANSETRON HCL 4 MG/2ML IJ SOLN: 4.0000 mg | Freq: Once | INTRAMUSCULAR | Status: DC | PRN

## 2019-12-25 MED ORDER — CHLORHEXIDINE GLUCONATE 0.12 % MT SOLN
15.0000 mL | Freq: Two times a day (BID) | OROMUCOSAL | Status: DC
  Administered 2019-12-25 – 2020-01-07 (×22): 15 mL via OROMUCOSAL
  Filled 2019-12-25 (×23): qty 15

## 2019-12-25 MED ORDER — MIDAZOLAM HCL (PF) 5 MG/ML IJ SOLN
INTRAMUSCULAR | Status: AC
  Filled 2019-12-25: qty 1

## 2019-12-25 MED ORDER — INDOMETHACIN 50 MG RE SUPP
RECTAL | Status: AC
  Filled 2019-12-25: qty 2

## 2019-12-25 MED ORDER — FENTANYL CITRATE (PF) 100 MCG/2ML IJ SOLN
INTRAMUSCULAR | Status: AC
  Filled 2019-12-25: qty 2

## 2019-12-25 MED ORDER — CHLORHEXIDINE GLUCONATE CLOTH 2 % EX PADS
6.0000 | MEDICATED_PAD | Freq: Every day | CUTANEOUS | Status: DC
  Administered 2019-12-25 – 2020-01-08 (×10): 6 via TOPICAL

## 2019-12-25 MED ORDER — INDOMETHACIN 50 MG RE SUPP: 100.0000 mg | Freq: Once | RECTAL | Status: DC

## 2019-12-25 MED ORDER — PROPOFOL 10 MG/ML IV BOLUS
INTRAVENOUS | Status: DC | PRN
  Administered 2019-12-25: 140 mg via INTRAVENOUS

## 2019-12-25 MED ORDER — SUCCINYLCHOLINE CHLORIDE 200 MG/10ML IV SOSY
PREFILLED_SYRINGE | INTRAVENOUS | Status: DC | PRN
  Administered 2019-12-25: 130 mg via INTRAVENOUS

## 2019-12-25 MED ORDER — SODIUM CHLORIDE 0.9 % IV SOLN
INTRAVENOUS | Status: DC | PRN
  Administered 2019-12-25: 30 mL

## 2019-12-25 MED ORDER — INDOMETHACIN 50 MG RE SUPP
RECTAL | Status: DC | PRN
  Administered 2019-12-25: 100 mg via RECTAL

## 2019-12-25 MED ORDER — LIDOCAINE 2% (20 MG/ML) 5 ML SYRINGE
INTRAMUSCULAR | Status: DC | PRN
  Administered 2019-12-25: 60 mg via INTRAVENOUS

## 2019-12-25 MED ORDER — POTASSIUM CHLORIDE CRYS ER 20 MEQ PO TBCR
40.0000 meq | EXTENDED_RELEASE_TABLET | Freq: Once | ORAL | Status: DC
  Filled 2019-12-25 (×2): qty 2

## 2019-12-25 MED ORDER — OXYCODONE HCL 5 MG/5ML PO SOLN: 5.0000 mg | Freq: Once | ORAL | Status: DC | PRN

## 2019-12-25 MED ORDER — FENTANYL CITRATE (PF) 100 MCG/2ML IJ SOLN: 25.0000 ug | INTRAMUSCULAR | Status: DC | PRN

## 2019-12-25 MED ORDER — ENOXAPARIN SODIUM 40 MG/0.4ML ~~LOC~~ SOLN
40.0000 mg | Freq: Every day | SUBCUTANEOUS | Status: DC
  Administered 2019-12-25 – 2020-01-04 (×11): 40 mg via SUBCUTANEOUS
  Filled 2019-12-25 (×11): qty 0.4

## 2019-12-25 MED ORDER — SODIUM CHLORIDE 0.9 % IV SOLN
3.0000 g | Freq: Once | INTRAVENOUS | Status: AC
  Administered 2019-12-25: 3 g via INTRAVENOUS
  Filled 2019-12-25: qty 8

## 2019-12-25 MED ORDER — LABETALOL HCL 5 MG/ML IV SOLN: 10.0000 mg | INTRAVENOUS | Status: DC | PRN

## 2019-12-25 MED ORDER — GLUCAGON HCL RDNA (DIAGNOSTIC) 1 MG IJ SOLR
INTRAMUSCULAR | Status: AC
  Filled 2019-12-25: qty 1

## 2019-12-25 MED ORDER — KCL-LACTATED RINGERS-D5W 20 MEQ/L IV SOLN
INTRAVENOUS | Status: DC
  Filled 2019-12-25 (×4): qty 1000

## 2019-12-25 MED ORDER — PROTAMINE SULFATE 10 MG/ML IV SOLN
INTRAVENOUS | Status: AC
  Filled 2019-12-25: qty 5

## 2019-12-25 MED ORDER — OXYCODONE HCL 5 MG PO TABS: 5.0000 mg | ORAL_TABLET | Freq: Once | ORAL | Status: DC | PRN

## 2019-12-25 MED ORDER — LACTATED RINGERS IV SOLN: INTRAVENOUS | Status: DC | PRN

## 2019-12-25 MED ORDER — LACTATED RINGERS IV BOLUS
1000.0000 mL | Freq: Once | INTRAVENOUS | Status: AC
  Administered 2019-12-25: 1000 mL via INTRAVENOUS

## 2019-12-25 NOTE — Anesthesia Postprocedure Evaluation (Signed)
Anesthesia Post Note  Patient: Denise King  Procedure(s) Performed: ENDOSCOPIC RETROGRADE CHOLANGIOPANCREATOGRAPHY (ERCP) WITH PROPOFOL (N/A ) SPHINCTEROTOMY REMOVAL OF STONES     Patient location during evaluation: PACU Anesthesia Type: General Level of consciousness: lethargic and responds to stimulation Pain management: pain level controlled Vital Signs Assessment: post-procedure vital signs reviewed and stable Respiratory status: spontaneous breathing and respiratory function unstable (Patient placed on BiPap after being unable to maintain adequate ventilation with non-rebreather in recovery) Cardiovascular status: blood pressure returned to baseline and stable Postop Assessment: no apparent nausea or vomiting Anesthetic complications: no Comments: Stat ABG with respiratory acidosis, CO2 ">97". Placed on BiPap with improvement in mental status. Critical care consulted for further inpatient management.    Last Vitals:  Vitals:   12/25/19 1115 12/25/19 1125  BP: (!) 153/70 140/74  Pulse: 82 86  Resp: 16 (!) 25  Temp:    SpO2: 92% 90%    Last Pain:  Vitals:   12/25/19 1125  TempSrc:   PainSc: 0-No pain                 Audry Pili

## 2019-12-25 NOTE — Anesthesia Preprocedure Evaluation (Addendum)
Anesthesia Evaluation  Patient identified by MRN, date of birth, ID bandGeneral Assessment Comment: Awake, sitting up right, vomiting. Answering questions appropriately, but slowly  Reviewed: Allergy & Precautions, NPO status , Patient's Chart, lab work & pertinent test results  History of Anesthesia Complications Negative for: history of anesthetic complications  Airway Mallampati: III  TM Distance: >3 FB Neck ROM: Full    Dental  (+) Dental Advisory Given   Pulmonary Current Smoker and Patient abstained from smoking.,    Pulmonary exam normal        Cardiovascular hypertension,  Rhythm:Regular Rate:Tachycardia   '20 Cath - Mid RCA lesion is 50% stenosed. Prox Cx to Mid Cx lesion is 30% stenosed. Prox LAD to Mid LAD lesion is 50% stenosed. LV end diastolic pressure is low.  '20 TTE - EF 60-65%. Left ventricular diastolic Doppler parameters are consistent with impaired relaxation. No valvulopathy identified.    Neuro/Psych  Headaches, PSYCHIATRIC DISORDERS Anxiety Depression  Vertigo     GI/Hepatic GERD  ,(+)     substance abuse  ,  [name] Actively vomiting in preop    Endo/Other  negative endocrine ROS  Renal/GU negative Renal ROS     Musculoskeletal  (+) Arthritis , narcotic dependent  Abdominal   Peds  Hematology negative hematology ROS (+)   Anesthesia Other Findings Covid neg 12/26   Reproductive/Obstetrics                           Anesthesia Physical Anesthesia Plan  ASA: III  Anesthesia Plan: General   Post-op Pain Management:    Induction: Intravenous, Rapid sequence and Cricoid pressure planned  PONV Risk Score and Plan: 3 and Treatment may vary due to age or medical condition, Ondansetron, Dexamethasone and Midazolam  Airway Management Planned: Oral ETT  Additional Equipment: None  Intra-op Plan:   Post-operative Plan: Extubation in OR  Informed  Consent: I have reviewed the patients History and Physical, chart, labs and discussed the procedure including the risks, benefits and alternatives for the proposed anesthesia with the patient or authorized representative who has indicated his/her understanding and acceptance.     Dental advisory given  Plan Discussed with: CRNA and Anesthesiologist  Anesthesia Plan Comments:        Anesthesia Quick Evaluation

## 2019-12-25 NOTE — Progress Notes (Signed)
She struggled to breathe on her own and required BiPap Had hi ETCO2 at start of procedure Resp acidosis To ICU

## 2019-12-25 NOTE — Interval H&P Note (Signed)
History and Physical Interval Note:  12/25/2019 9:08 AM  Denise King  has presented today for surgery, with the diagnosis of Choledocholithiasis..  The various methods of treatment have been discussed with the patient and family. After consideration of risks, benefits and other options for treatment, the patient has consented to  Procedure(s): ENDOSCOPIC RETROGRADE CHOLANGIOPANCREATOGRAPHY (ERCP) WITH PROPOFOL (N/A) as a surgical intervention.  The patient's history has been reviewed, patient examined, no change in status, stable for surgery.  I have reviewed the patient's chart and labs.  Questions were answered to the patient's satisfaction.     Silvano Rusk

## 2019-12-25 NOTE — Progress Notes (Signed)
   Patient was somnolent and not breathing on her own well post ERCP  Had high ET CO2 at start of case after intubation  Has pure resp acidosis  Dr. Vaughan Browner is seeing the patient and will assume care

## 2019-12-25 NOTE — Progress Notes (Signed)
Patient beginning to be more arousable. VT still a bit small, but breathing over BiPAP and following some commands. Waiting on bed in [facility]

## 2019-12-25 NOTE — Op Note (Addendum)
[facility] Patient Name: Denise King Procedure Date : 12/25/2019 MRN: [id number] Attending MD: Gatha Mayer , MD Date of Birth: [birth year] CSN: [id number] Age: 68 Admit Type: Inpatient Procedure:                ERCP Indications:              Suspected bile duct stone(s), For therapy of bile                            duct stone(s) Providers:                Gatha Mayer, MD, Carlyn Reichert, RN, Lina Sar, Technician, Claybon Jabs CRNA, CRNA Referring MD:             Ralene Ok Medicines:                General Anesthesia, Unasyn 3 g IV (running) Complications:            No immediate complications. Estimated Blood Loss:     Estimated blood loss: none. Procedure:                Pre-Anesthesia Assessment:                           - Prior to the procedure, a History and Physical                            was performed, and patient medications and                            allergies were reviewed. The patient's tolerance of                            previous anesthesia was also reviewed. The risks                            and benefits of the procedure and the sedation                            options and risks were discussed with the patient.                            All questions were answered, and informed consent                            was obtained. Prior Anticoagulants: The patient                            last took Lovenox (enoxaparin) 1 day prior to the                            procedure. ASA Grade Assessment: III - A patient  with severe systemic disease. After reviewing the                            risks and benefits, the patient was deemed in                            satisfactory condition to undergo the procedure.                           After obtaining informed consent, the scope was                            passed under direct vision. Throughout the             procedure, the patient's blood pressure, pulse, and                            oxygen saturations were monitored continuously. The                            [id number] Olympus duodenoscope was                            introduced through the mouth, and used to inject                            contrast into and used to inject contrast into the                            bile duct. The ERCP was somewhat difficult due to                            challenging cannulation because of abnormal anatomy                            - low lying cystic duct and also hardware in T                            spine (imaging interference). The patient tolerated                            the procedure well. Scope In: Scope Out: Findings:      The scout film was showed hardware in thoracic spine that overlapped       distal end of the scope in area of papilla.. The esophagus was       successfully intubated under direct vision. The scope was advanced to a       normal major papilla in the descending duodenum without detailed       examination of the pharynx, larynx and associated structures, and upper       GI tract. The upper GI tract was grossly normal. Prominent minor papilla       seen - she has divisum. Major papilla seen - cannulated with wire -       initially wire went into cystic  duct (contrast showed) but with position       changes was able to cannulate the CBD/CHD and get injection of biliary       tree. Mildly dilated 8-9 mm CBD/CHD. No obvious stones but given MRCP       indicating sludge and small CBD stones biliary sphincterotomy and       balloon sweeps done. No stones out - occluison cholangiogram negative.       Did not see clear cholelithisasis - gallbladder did fill.Scope       withdrawn. No pancreatic duct cannulation by intent. Indomethacin gicen       100 mg perrectum. Impression:               - The common bile duct was mildly dilated.                            - The examination was otherwise normal. Recommendation:           Admit to ICU vs step down                           Toradol would be a potential adjunct treatment                           Stay on clears today when alert enough - further                            diet per surgery re: timing of lap chole                           Continue Unasyn                           Labs in AM - to include lipase                           Spoke to husband w/ results - he thinks she "worn                            out from 2 weeks of diarrhea" and says her dose of                            pain meds has recently been reduced, i.e. not                            having issues with narcotics and lethargy                           I am changing to D5LR + K at 125 cc/hr and giving a                            LR bolus - to help rehydrate and reduce risk                            post-ERCP pancreatitis  She was not ventilating welkl in recovery and on                            BiPAP havce called CCM to see her - think she needs                            ICU cate until resp statrus better                           she was lethargic at start and had high end tidal                            CO2 also (90) after intubated Procedure Code(s):        --- Professional ---                           [phone], Endoscopic retrograde                            cholangiopancreatography (ERCP); with                            sphincterotomy/papillotomy Diagnosis Code(s):        --- Professional ---                           K80.50, Calculus of bile duct without cholangitis                            or cholecystitis without obstruction                           K83.8, Other specified diseases of biliary tract CPT copyright 2019 [employer]. All rights reserved. The codes documented in this report are preliminary and upon coder review may  be revised to meet current  compliance requirements. Gatha Mayer, MD 12/25/2019 10:29:59 AM This report has been signed electronically. Number of Addenda: 0

## 2019-12-25 NOTE — Progress Notes (Signed)
[facility] Progress Note  Day of Surgery  Subjective: CC-  Patient transferred to ICU after ERCP. She developed respiratory failure requiring Bipap post-procedure. CCM consulting.  Objective: Vital signs in last 24 hours: Temp:  [97 F (36.1 C)-99.1 F (37.3 C)] 97.9 F (36.6 C) (12/28 1400) Pulse Rate:  [62-117] 77 (12/28 1500) Resp:  [9-27] 27 (12/28 1500) BP: (106-187)/(50-95) 132/72 (12/28 1500) SpO2:  [75 %-99 %] 96 % (12/28 1500) FiO2 (%):  [50 %-100 %] 50 % (12/28 1400) Last BM Date: 12/25/19  Intake/Output from previous day: 12/27 0701 - 12/28 0700 In: 114 [P.O.:114] Out: -  Intake/Output this shift: Total I/O In: 2900.7 [I.V.:2194.9; IV Piggyback:705.9] Out: 500 [Urine:500]  PE: Gen:  Alert, NAD HEENT: EOM's intact, pupils equal and round Card:  RRR, no M/G/R heard Pulm:  On Bipap, diminished breath sounds, trace expiratory wheezing on the left, no rhonchi Abd: Soft, NT/ND, +BS, no HSM, no hernia Skin: no rashes noted, warm and dry  Lab Results:  Recent Labs    12/23/19 0948 12/24/19 0500 12/25/19 1148 12/25/19 1553  WBC 6.4 6.0  --   --   HGB 15.8* 14.4 18.4* 16.7*  HCT 49.9* 45.6 54.0* 49.0*  PLT 181 153  --   --    BMET Recent Labs    12/23/19 0948 12/24/19 0500 12/25/19 1148 12/25/19 1553  NA 135 138 138 140  K 3.9 3.2* 3.8 3.9  CL 92* 99  --   --   CO2 32 31  --   --   GLUCOSE 121* 83  --   --   BUN <5* <5*  --   --   CREATININE 0.85 0.71  --   --   CALCIUM 8.6* 8.0*  --   --    PT/INR No results for input(s): LABPROT, INR in the last 72 hours. CMP     Component Value Date/Time   NA 140 12/25/2019 1553   NA 134 05/09/2019 1551   K 3.9 12/25/2019 1553   CL 99 12/24/2019 0500   CO2 31 12/24/2019 0500   GLUCOSE 83 12/24/2019 0500   BUN <5 (L) 12/24/2019 0500   BUN 6 (L) 05/09/2019 1551   CREATININE 0.71 12/24/2019 0500   CREATININE 0.53 03/23/2012 1340   CALCIUM 8.0 (L) 12/24/2019 0500   PROT 5.0 (L) 12/24/2019  0500   PROT 6.5 12/04/2019 1117   ALBUMIN 2.6 (L) 12/24/2019 0500   ALBUMIN 3.9 12/04/2019 1117   AST 20 12/24/2019 0500   ALT 9 12/24/2019 0500   ALKPHOS 80 12/24/2019 0500   BILITOT 0.9 12/24/2019 0500   BILITOT 0.4 12/04/2019 1117   GFRNONAA >60 12/24/2019 0500   GFRAA >60 12/24/2019 0500   Lipase     Component Value Date/Time   LIPASE 20 12/23/2019 0948       Studies/Results: CT Chest Wo Contrast  Result Date: 12/23/2019 CLINICAL DATA:  Shortness of breath and hypoxia. Abnormal chest x-ray. EXAM: CT CHEST WITHOUT CONTRAST TECHNIQUE: Multidetector CT imaging of the chest was performed following the standard protocol without IV contrast. COMPARISON:  One-view chest x-ray 01/22/2019 FINDINGS: Cardiovascular: The heart size is normal. Dense coronary artery calcifications are present. Atherosclerotic calcifications are present at the aortic arch and origins the great vessels. Mediastinum/Nodes: No significant mediastinal axillary, or hilar adenopathy is present. Lungs/Pleura: The lung fields are clear. There is no nodule or mass lesion. The nodular density corresponds with the anterior right rib. Bibasilar airspace disease is present, right  greater than left. No effusions are present. There is no pneumothorax. Upper Abdomen: IV contrast is noted within the renal collecting systems. Limited imaging of the upper abdomen is otherwise unremarkable. Musculoskeletal: Leftward curvature is present in the upper thoracic spine. Vertebral body heights are maintained. Focal lytic or blastic lesions are present. IMPRESSION: 1. The nodular density on chest x-ray corresponds with the anterior right rib. 2. Bibasilar airspace disease, right greater than left. While this may represent atelectasis, infection is not excluded. 3. Coronary artery disease. 4. Aortic Atherosclerosis (ICD10-I70.0). Electronically Signed   By: San Morelle M.D.   On: 12/23/2019 19:22   MR 3D Recon At Scanner  Result  Date: 12/24/2019 CLINICAL DATA:  Gallstones with biliary dilatation. EXAM: MRI ABDOMEN WITHOUT AND WITH CONTRAST (INCLUDING MRCP) TECHNIQUE: Multiplanar multisequence MR imaging of the abdomen was performed both before and after the administration of intravenous contrast. Heavily T2-weighted images of the biliary and pancreatic ducts were obtained, and three-dimensional MRCP images were rendered by post processing. CONTRAST:  34mL GADAVIST GADOBUTROL 1 MMOL/ML IV SOLN COMPARISON:  Abdomen/pelvis CT 12/23/2019. Abdominal ultrasound 12/23/2019. FINDINGS: Lower chest: Dependent atelectasis with tiny bilateral effusions. Hepatobiliary: Tiny cyst noted in the anterior left liver. No suspicious enhancing liver abnormality. Gallbladder is distended with small volume pericholecystic fluid. Several tiny/granular stones are visualized in the gallbladder lumen (image 12 coronal T2 series 12). Common bile duct in the head of pancreas measures up to 8-9 mm diameter with some dependent sludge and probable tiny dependent stones (coronal T2 image 9 of series 12). Common bile duct inserts low on the descending duodenum. Pancreas: Pancreas divisum ductal anatomy. No evidence for pancreatic mass lesion although assessment on postcontrast imaging is motion degraded. Spleen:  No splenomegaly. No focal mass lesion. Adrenals/Urinary Tract: No adrenal nodule or mass. Tiny T2 hyperintense lesions in the cortex of both kidney show no appreciable enhancement after IV contrast administration, but assessment on postcontrast imaging is markedly motion degraded. Stomach/Bowel: Stomach is unremarkable. No gastric wall thickening. No evidence of outlet obstruction. Duodenum is normally positioned as is the ligament of Treitz. No small bowel or colonic dilatation within the visualized abdomen. Wall thickening in the transverse colon described on the recent CT scan is not well demonstrated by MR. Vascular/Lymphatic: No abdominal aortic aneurysm. No  abdominal lymphadenopathy. Other: Trace perihepatic fluid with trace fluid in the gallbladder fossa. Body wall edema noted. Musculoskeletal: Status post lumbosacral fusion. No suspicious marrow enhancement within the visualized bony anatomy. IMPRESSION: 1. Cholelithiasis with small volume pericholecystic fluid. Mild intra and extrahepatic biliary duct dilatation is associated with several apparent tiny granular stones in the common bile duct. 2. Pancreas divisum anatomy with major papilla low on the descending duodenum. 3. Probable tiny bilateral renal cysts incompletely characterized due to substantial motion degradation on postcontrast imaging today. Electronically Signed   By: Misty Stanley M.D.   On: 12/24/2019 10:43   DG CHEST PORT 1 VIEW  Result Date: 12/25/2019 CLINICAL DATA:  Acute respiratory failure, post ERCP, BIPAP EXAM: PORTABLE CHEST 1 VIEW COMPARISON:  CT chest 12/23/2019, chest radiograph 12/23/2019 FINDINGS: Heart size at the upper limits of normal, unchanged. Aortic atherosclerosis. Significantly increased from prior examinations, there is bibasilar opacity consistent with atelectasis and/or pneumonia with possible small effusions. No evidence of pneumothorax. Upper thoracic levocurvature. Overlying cardiac monitoring leads. IMPRESSION: Increasing bibasilar opacities consistent with atelectasis and/or pneumonia with possible small effusions. Electronically Signed   By: Kellie Simmering DO   On: 12/25/2019 11:10   DG  ERCP sphincterotomy  Result Date: 12/25/2019 CLINICAL DATA:  ERCP for choledocholithiasis. EXAM: ERCP TECHNIQUE: Multiple spot images obtained with the fluoroscopic device and submitted for interpretation post-procedure. FLUOROSCOPY TIME:  5 minutes, 39 seconds COMPARISON:  MRCP-12/24/2019 FINDINGS: Eight spot intraoperative fluoroscopic images of the right upper abdominal quadrant during ERCP are provided for review Initial image demonstrates an ERCP probe overlying the right  upper abdominal quadrant with selective cannulation opacification of the common bile duct which appears mildly dilated. Subsequent images demonstrate insufflation of a balloon within the mid aspect of the CBD with subsequent biliary sweeping and presumed sphincterotomy. There is passage of contrast through the cystic duct with opacification of the gallbladder. There is minimal opacification of the intrahepatic biliary tree which appears mildly dilated. IMPRESSION: ERCP with biliary sweeping and presumed sphincterotomy as above. These images were submitted for radiologic interpretation only. Please see the procedural report for the amount of contrast and the fluoroscopy time utilized. Electronically Signed   By: Sandi Mariscal M.D.   On: 12/25/2019 10:25   MR ABDOMEN MRCP W WO CONTAST  Result Date: 12/24/2019 CLINICAL DATA:  Gallstones with biliary dilatation. EXAM: MRI ABDOMEN WITHOUT AND WITH CONTRAST (INCLUDING MRCP) TECHNIQUE: Multiplanar multisequence MR imaging of the abdomen was performed both before and after the administration of intravenous contrast. Heavily T2-weighted images of the biliary and pancreatic ducts were obtained, and three-dimensional MRCP images were rendered by post processing. CONTRAST:  80mL GADAVIST GADOBUTROL 1 MMOL/ML IV SOLN COMPARISON:  Abdomen/pelvis CT 12/23/2019. Abdominal ultrasound 12/23/2019. FINDINGS: Lower chest: Dependent atelectasis with tiny bilateral effusions. Hepatobiliary: Tiny cyst noted in the anterior left liver. No suspicious enhancing liver abnormality. Gallbladder is distended with small volume pericholecystic fluid. Several tiny/granular stones are visualized in the gallbladder lumen (image 12 coronal T2 series 12). Common bile duct in the head of pancreas measures up to 8-9 mm diameter with some dependent sludge and probable tiny dependent stones (coronal T2 image 9 of series 12). Common bile duct inserts low on the descending duodenum. Pancreas: Pancreas  divisum ductal anatomy. No evidence for pancreatic mass lesion although assessment on postcontrast imaging is motion degraded. Spleen:  No splenomegaly. No focal mass lesion. Adrenals/Urinary Tract: No adrenal nodule or mass. Tiny T2 hyperintense lesions in the cortex of both kidney show no appreciable enhancement after IV contrast administration, but assessment on postcontrast imaging is markedly motion degraded. Stomach/Bowel: Stomach is unremarkable. No gastric wall thickening. No evidence of outlet obstruction. Duodenum is normally positioned as is the ligament of Treitz. No small bowel or colonic dilatation within the visualized abdomen. Wall thickening in the transverse colon described on the recent CT scan is not well demonstrated by MR. Vascular/Lymphatic: No abdominal aortic aneurysm. No abdominal lymphadenopathy. Other: Trace perihepatic fluid with trace fluid in the gallbladder fossa. Body wall edema noted. Musculoskeletal: Status post lumbosacral fusion. No suspicious marrow enhancement within the visualized bony anatomy. IMPRESSION: 1. Cholelithiasis with small volume pericholecystic fluid. Mild intra and extrahepatic biliary duct dilatation is associated with several apparent tiny granular stones in the common bile duct. 2. Pancreas divisum anatomy with major papilla low on the descending duodenum. 3. Probable tiny bilateral renal cysts incompletely characterized due to substantial motion degradation on postcontrast imaging today. Electronically Signed   By: Misty Stanley M.D.   On: 12/24/2019 10:43    Anti-infectives: Anti-infectives (From admission, onward)   Start     Dose/Rate Route Frequency Ordered Stop   12/25/19 0930  Ampicillin-Sulbactam (UNASYN) 3 g in sodium chloride 0.9 %  100 mL IVPB     3 g 200 mL/hr over 30 Minutes Intravenous  Once 12/25/19 0928 12/25/19 0958   12/23/19 2115  Ampicillin-Sulbactam (UNASYN) 3 g in sodium chloride 0.9 % 100 mL IVPB     3 g 200 mL/hr over 30  Minutes Intravenous Every 6 hours 12/23/19 2100     12/23/19 2103  valACYclovir (VALTREX) tablet 500 mg     500 mg Oral 2 times daily PRN 12/23/19 2104         Assessment/Plan 64F cholecystitis, choledocholithiasis - s/p ERCP 12/28, no common bile duct stones were found and sphincterotomy performed - transferred to ICU post-procedure for acute respiratory failure, CCM following - Likely will not be ready for laparoscopic cholecystectomy tomorrow from respiratory standpoint. Continue antibiotics. Will continue to follow.  ID - unasyn 12/26>> FEN - IVF, NPO VTE - SCDs, lovenox Foley - none Follow up - TBD   LOS: 2 days    Wellington Hampshire, [facility] 12/25/2019, 3:58 PM Please see Amion for pager number during day hours 7:00am-4:30pm

## 2019-12-25 NOTE — Transfer of Care (Signed)
Immediate Anesthesia Transfer of Care Note  Patient: Denise King  Procedure(s) Performed: ENDOSCOPIC RETROGRADE CHOLANGIOPANCREATOGRAPHY (ERCP) WITH PROPOFOL (N/A ) SPHINCTEROTOMY REMOVAL OF STONES  Patient Location: Endoscopy Unit  Anesthesia Type:General  Level of Consciousness: obtunded  Airway & Oxygen Therapy: Placed on BIPAP after unable to adequately ventilate on own  Post-op Assessment: Report given to RN and Post -op Vital signs reviewed and unstable, Anesthesiologist notified  Post vital signs: Reviewed and stable  Last Vitals:  Vitals Value Taken Time  BP 177/84 12/25/19 1028  Temp    Pulse 117 12/25/19 1035  Resp 19 12/25/19 1035  SpO2 93 % 12/25/19 1035  Vitals shown include unvalidated device data.  Last Pain:  Vitals:   12/25/19 [id number]  TempSrc: Oral  PainSc: 10-Worst pain ever         Complications: No apparent anesthesia complications

## 2019-12-25 NOTE — Progress Notes (Addendum)
0810 pt is sleepy, oriented x3. C/o back pain. NPO maint for ERCP this morning. To Endo via bed. 1150 Pt is not coming back to 5N13. No assigned room in ICU yet, will gather Pt's belongings and deliver it to her room when ready.

## 2019-12-25 NOTE — Progress Notes (Addendum)
PROGRESS NOTE    Denise King  [id number] DOB: [birth year] DOA: 12/23/2019 PCP: Biagio Borg, MD   Brief Narrative:  Patient is a 68 year old Caucasian female with past medical history significant for irritable bowel syndrome, chronic pain syndrome, hypertension, GERD, alcohol], diverticulosis, anxiety and anemia.  Patient was admitted with gallstone cholecystitis.  MRI of the abdomen revealed gallstones in the common bile duct.  GI team has been consulted.  For possible ERCP tomorrow.  Surgical team has also been consulted.  Patient is currently pain-free.  No nausea or vomiting at the moment.  Further management depend on hospital course.  12/28: Patient is noted to be quite sleepy this morning.  She is arousable and oriented x3 however.  Plans are for ERCP today and possible cholecystectomy thereafter.  She remains on Unasyn.  Assessment & Plan:   Principal Problem:   Nausea & vomiting Active Problems:   Chronic pain syndrome   Common bile duct stone   Acute cholecystitis   Acute gallstone cholecystitis: -MRI of the abdomen reveals common bile duct stones. -ERCP 12/28 per GI -Likely cholecystectomy afterwards. -Continue IV Unasyn  Nausea and vomiting: -Likely related to above. -No nausea or vomiting at the moment. -Continue to manage expectantly.  Mild hypokalemia: -Replete and recheck in a.m. along with magnesium  Chronic pain syndrome: -Pain is currently controlled, but patient is somnolent -Need to carefully monitor pain medication regimen and avoid for excessive somnolence  Documented hypertension: -Blood pressures beginning to elevate -We will order labetalol as needed   DVT prophylaxis: Lovenox Code Status: Full Family Communication: None at bedside Disposition Plan: Planned ERCP today with likely cholecystectomy at a later date.  Continue IV Unasyn.   Consultants:   GI  General surgery  Procedures:   Planned ERCP  12/28  Antimicrobials:  Anti-infectives (From admission, onward)   Start     Dose/Rate Route Frequency Ordered Stop   12/23/19 2115  [MAR Hold]  Ampicillin-Sulbactam (UNASYN) 3 g in sodium chloride 0.9 % 100 mL IVPB     (MAR Hold since Mon 12/25/2019 at 0819.Hold Reason: Transfer to a Procedural area.)   3 g 200 mL/hr over 30 Minutes Intravenous Every 6 hours 12/23/19 2100     12/23/19 2103  [MAR Hold]  valACYclovir (VALTREX) tablet 500 mg     (MAR Hold since Mon 12/25/2019 at 0819.Hold Reason: Transfer to a Procedural area.)   500 mg Oral 2 times daily PRN 12/23/19 2104          Subjective: Patient seen and evaluated today with excessive somnolence noted this morning.  No significant abdominal pain otherwise reported.  No acute concerns or events noted overnight.  Objective: Vitals:   12/24/19 1437 12/25/19 0409 12/25/19 0801 12/25/19 0822  BP: 110/60 121/70 (!) 166/86 (!) 171/95  Pulse: 84 88 75 (!) 104  Resp:   14 15  Temp: 98.3 F (36.8 C) 98.9 F (37.2 C) 99.1 F (37.3 C)   TempSrc: Oral Oral Oral   SpO2: 97% 94% 90% 93%  Weight:      Height:        Intake/Output Summary (Last 24 hours) at 12/25/2019 0827 Last data filed at 12/24/2019 1500 Gross per 24 hour  Intake 0 ml  Output --  Net 0 ml   Filed Weights   12/23/19 1225  Weight: 48.5 kg    Examination:  General exam: Appears calm and comfortable, somnolent Respiratory system: Clear to auscultation. Respiratory effort normal.  On 3 L nasal  cannula oxygen. Cardiovascular system: S1 & S2 heard, RRR. No JVD, murmurs, rubs, gallops or clicks. No pedal edema. Gastrointestinal system: Abdomen is nondistended, soft and nontender. No organomegaly or masses felt. Normal bowel sounds heard. Central nervous system: Somnolent, but arousable Extremities: No edema Skin: No rashes, lesions or ulcers Psychiatry: Difficult to evaluate.    Data Reviewed: I have personally reviewed following labs and imaging  studies  CBC: Recent Labs  Lab 12/23/19 0948 12/24/19 0500  WBC 6.4 6.0  HGB 15.8* 14.4  HCT 49.9* 45.6  MCV 101.6* 102.5*  PLT 181 0000000   Basic Metabolic Panel: Recent Labs  Lab 12/23/19 0948 12/24/19 0500  NA 135 138  K 3.9 3.2*  CL 92* 99  CO2 32 31  GLUCOSE 121* 83  BUN <5* <5*  CREATININE 0.85 0.71  CALCIUM 8.6* 8.0*   GFR: Estimated Creatinine Clearance: 53 mL/min (by C-G formula based on SCr of 0.71 mg/dL). Liver Function Tests: Recent Labs  Lab 12/23/19 0948 12/24/19 0500  AST 36 20  ALT 12 9  ALKPHOS 97 80  BILITOT 0.5 0.9  PROT 5.8* 5.0*  ALBUMIN 3.3* 2.6*   Recent Labs  Lab 12/23/19 0948  LIPASE 20   No results for input(s): AMMONIA in the last 168 hours. Coagulation Profile: No results for input(s): INR, PROTIME in the last 168 hours. Cardiac Enzymes: No results for input(s): CKTOTAL, CKMB, CKMBINDEX, TROPONINI in the last 168 hours. BNP (last 3 results) No results for input(s): PROBNP in the last 8760 hours. HbA1C: No results for input(s): HGBA1C in the last 72 hours. CBG: No results for input(s): GLUCAP in the last 168 hours. Lipid Profile: No results for input(s): CHOL, HDL, LDLCALC, TRIG, CHOLHDL, LDLDIRECT in the last 72 hours. Thyroid Function Tests: No results for input(s): TSH, T4TOTAL, FREET4, T3FREE, THYROIDAB in the last 72 hours. Anemia Panel: No results for input(s): VITAMINB12, FOLATE, FERRITIN, TIBC, IRON, RETICCTPCT in the last 72 hours. Sepsis Labs: No results for input(s): PROCALCITON, LATICACIDVEN in the last 168 hours.  Recent Results (from the past 240 hour(s))  SARS CORONAVIRUS 2 (TAT 6-24 HRS) Nasopharyngeal Nasopharyngeal Swab     Status: None   Collection Time: 12/23/19  3:21 PM   Specimen: Nasopharyngeal Swab  Result Value Ref Range Status   SARS Coronavirus 2 NEGATIVE NEGATIVE Final    Comment: (NOTE) SARS-CoV-2 target nucleic acids are NOT DETECTED. The SARS-CoV-2 RNA is generally detectable in upper and  lower respiratory specimens during the acute phase of infection. Negative results do not preclude SARS-CoV-2 infection, do not rule out co-infections with other pathogens, and should not be used as the sole basis for treatment or other patient management decisions. Negative results must be combined with clinical observations, patient history, and epidemiological information. The expected result is Negative. Fact Sheet for Patients: SugarRoll.be Fact Sheet for Healthcare Providers: https://www.woods-mathews.com/ This test is not yet approved or cleared by the [place] FDA and  has been authorized for detection and/or diagnosis of SARS-CoV-2 by FDA under an Emergency Use Authorization (EUA). This EUA will remain  in effect (meaning this test can be used) for the duration of the COVID-19 declaration under Section 56 4(b)(1) of the Act, 21 U.S.C. section 360bbb-3(b)(1), unless the authorization is terminated or revoked sooner. Performed at [facility], [place] [address], [place], [place] [address]          Radiology Studies: CT Chest Wo Contrast  Result Date: 12/23/2019 CLINICAL DATA:  Shortness of breath and  hypoxia. Abnormal chest x-ray. EXAM: CT CHEST WITHOUT CONTRAST TECHNIQUE: Multidetector CT imaging of the chest was performed following the standard protocol without IV contrast. COMPARISON:  One-view chest x-ray 01/22/2019 FINDINGS: Cardiovascular: The heart size is normal. Dense coronary artery calcifications are present. Atherosclerotic calcifications are present at the aortic arch and origins the great vessels. Mediastinum/Nodes: No significant mediastinal axillary, or hilar adenopathy is present. Lungs/Pleura: The lung fields are clear. There is no nodule or mass lesion. The nodular density corresponds with the anterior right rib. Bibasilar airspace disease is present, right greater than left. No effusions are present. There is no  pneumothorax. Upper Abdomen: IV contrast is noted within the renal collecting systems. Limited imaging of the upper abdomen is otherwise unremarkable. Musculoskeletal: Leftward curvature is present in the upper thoracic spine. Vertebral body heights are maintained. Focal lytic or blastic lesions are present. IMPRESSION: 1. The nodular density on chest x-ray corresponds with the anterior right rib. 2. Bibasilar airspace disease, right greater than left. While this may represent atelectasis, infection is not excluded. 3. Coronary artery disease. 4. Aortic Atherosclerosis (ICD10-I70.0). Electronically Signed   By: San Morelle M.D.   On: 12/23/2019 19:22   CT ABDOMEN PELVIS W CONTRAST  Result Date: 12/23/2019 CLINICAL DATA:  Abdominal pain, LEFT side abdominal tenderness, vomiting since 0100 hours, low-grade fever, chills, history of ethanol dependence, GERD, hypertension, irritable bowel syndrome, smoker EXAM: CT ABDOMEN AND PELVIS WITH CONTRAST TECHNIQUE: Multidetector CT imaging of the abdomen and pelvis was performed using the standard protocol following bolus administration of intravenous contrast. Sagittal and coronal MPR images reconstructed from axial data set. CONTRAST:  153mL OMNIPAQUE IOHEXOL 300 MG/ML SOLN IV. No oral contrast. COMPARISON:  07/17/2017 FINDINGS: Lower chest: Subsegmental atelectasis dependently at both lung bases greater on RIGHT Hepatobiliary: Fatty infiltration of liver. Tiny nonspecific low-attenuation focus anterior LEFT lobe likely 5 mm cyst unchanged. Dependent density within gallbladder likely reflects cholelithiasis. Mild pericholecystic edema. Dilated CBD 11 mm diameter. Filling defect within distal CBD axial image 38 highly suspicious for choledocholithiasis. Questionable additional subtle intraluminal density on coronal images. Pancreas: Unremarkable Spleen: Normal appearance Adrenals/Urinary Tract: Adrenal glands normal appearance. Cortical thinning of both kidneys  without renal mass or hydronephrosis. No urinary tract calcification or ureteral dilatation. Bladder unremarkable. Stomach/Bowel: Appendix surgically absent by history. Bowel wall thickening of transverse colon compatible with colitis. Remainder of colon unremarkable. Small bowel loops normal appearance. Wall thickening of gastric antrum with slight hyperenhancement of mucosa question gastritis; no discrete ulcer identified. Vascular/Lymphatic: Extensive atherosclerotic calcifications aorta, iliac arteries, femoral arteries, coronary arteries. Aorta normal caliber. No adenopathy. Reproductive: Atrophic uterus and ovaries Other: No free air or free fluid.  No hernia. Musculoskeletal: Osseous demineralization. Orthopedic hardware proximal RIGHT femur. Prior lumbar fusion L4-S1. Degenerative disc disease changes L3-L4. IMPRESSION: Bowel wall thickening of the transverse colon consistent with colitis: consider inflammatory bowel disease and infection. Edema of gastric antrum with hyperenhancement of the mucosa raising question of gastritis, with no discrete ulcer identified. Cholelithiasis with pericholecystic fluid, cannot exclude acute cholecystitis. Extrahepatic biliary dilatation with at least 1 filling defect in distal CBD highly suspicious for choledocholithiasis; recommend correlation with LFTs and consider MRCP imaging. Fatty infiltration of liver. Bibasilar atelectasis Aortic Atherosclerosis (ICD10-I70.0). Electronically Signed   By: Lavonia Dana M.D.   On: 12/23/2019 14:28   MR 3D Recon At Scanner  Result Date: 12/24/2019 CLINICAL DATA:  Gallstones with biliary dilatation. EXAM: MRI ABDOMEN WITHOUT AND WITH CONTRAST (INCLUDING MRCP) TECHNIQUE: Multiplanar multisequence MR imaging of the  abdomen was performed both before and after the administration of intravenous contrast. Heavily T2-weighted images of the biliary and pancreatic ducts were obtained, and three-dimensional MRCP images were rendered by post  processing. CONTRAST:  60mL GADAVIST GADOBUTROL 1 MMOL/ML IV SOLN COMPARISON:  Abdomen/pelvis CT 12/23/2019. Abdominal ultrasound 12/23/2019. FINDINGS: Lower chest: Dependent atelectasis with tiny bilateral effusions. Hepatobiliary: Tiny cyst noted in the anterior left liver. No suspicious enhancing liver abnormality. Gallbladder is distended with small volume pericholecystic fluid. Several tiny/granular stones are visualized in the gallbladder lumen (image 12 coronal T2 series 12). Common bile duct in the head of pancreas measures up to 8-9 mm diameter with some dependent sludge and probable tiny dependent stones (coronal T2 image 9 of series 12). Common bile duct inserts low on the descending duodenum. Pancreas: Pancreas divisum ductal anatomy. No evidence for pancreatic mass lesion although assessment on postcontrast imaging is motion degraded. Spleen:  No splenomegaly. No focal mass lesion. Adrenals/Urinary Tract: No adrenal nodule or mass. Tiny T2 hyperintense lesions in the cortex of both kidney show no appreciable enhancement after IV contrast administration, but assessment on postcontrast imaging is markedly motion degraded. Stomach/Bowel: Stomach is unremarkable. No gastric wall thickening. No evidence of outlet obstruction. Duodenum is normally positioned as is the ligament of Treitz. No small bowel or colonic dilatation within the visualized abdomen. Wall thickening in the transverse colon described on the recent CT scan is not well demonstrated by MR. Vascular/Lymphatic: No abdominal aortic aneurysm. No abdominal lymphadenopathy. Other: Trace perihepatic fluid with trace fluid in the gallbladder fossa. Body wall edema noted. Musculoskeletal: Status post lumbosacral fusion. No suspicious marrow enhancement within the visualized bony anatomy. IMPRESSION: 1. Cholelithiasis with small volume pericholecystic fluid. Mild intra and extrahepatic biliary duct dilatation is associated with several apparent tiny  granular stones in the common bile duct. 2. Pancreas divisum anatomy with major papilla low on the descending duodenum. 3. Probable tiny bilateral renal cysts incompletely characterized due to substantial motion degradation on postcontrast imaging today. Electronically Signed   By: Misty Stanley M.D.   On: 12/24/2019 10:43   DG Chest Portable 1 View  Result Date: 12/23/2019 CLINICAL DATA:  Cough and shortness of breath. EXAM: PORTABLE CHEST 1 VIEW COMPARISON:  05/04/2019 FINDINGS: Lungs are hyperexpanded. Interstitial markings are diffusely coarsened with chronic features. Mild patchy atelectasis or infiltrate noted at the bases. No pleural effusion. Nodular density noted right upper lobe, superimposed on the anterior end of the first rib. Cardiopericardial silhouette is at upper limits of normal for size. Bones are diffusely demineralized. Telemetry leads overlie the chest. IMPRESSION: 1. Emphysema with bibasilar atelectasis or infiltrate. 2. Nodular density in the right upper lobe may be related to the anterior right first rib and. Follow-up PA and lateral chest x-ray could be used to further assess. Alternatively, CT chest without contrast could be used to further evaluate as clinically warranted. Electronically Signed   By: Misty Stanley M.D.   On: 12/23/2019 13:43   MR ABDOMEN MRCP W WO CONTAST  Result Date: 12/24/2019 CLINICAL DATA:  Gallstones with biliary dilatation. EXAM: MRI ABDOMEN WITHOUT AND WITH CONTRAST (INCLUDING MRCP) TECHNIQUE: Multiplanar multisequence MR imaging of the abdomen was performed both before and after the administration of intravenous contrast. Heavily T2-weighted images of the biliary and pancreatic ducts were obtained, and three-dimensional MRCP images were rendered by post processing. CONTRAST:  23mL GADAVIST GADOBUTROL 1 MMOL/ML IV SOLN COMPARISON:  Abdomen/pelvis CT 12/23/2019. Abdominal ultrasound 12/23/2019. FINDINGS: Lower chest: Dependent atelectasis with tiny  bilateral  effusions. Hepatobiliary: Tiny cyst noted in the anterior left liver. No suspicious enhancing liver abnormality. Gallbladder is distended with small volume pericholecystic fluid. Several tiny/granular stones are visualized in the gallbladder lumen (image 12 coronal T2 series 12). Common bile duct in the head of pancreas measures up to 8-9 mm diameter with some dependent sludge and probable tiny dependent stones (coronal T2 image 9 of series 12). Common bile duct inserts low on the descending duodenum. Pancreas: Pancreas divisum ductal anatomy. No evidence for pancreatic mass lesion although assessment on postcontrast imaging is motion degraded. Spleen:  No splenomegaly. No focal mass lesion. Adrenals/Urinary Tract: No adrenal nodule or mass. Tiny T2 hyperintense lesions in the cortex of both kidney show no appreciable enhancement after IV contrast administration, but assessment on postcontrast imaging is markedly motion degraded. Stomach/Bowel: Stomach is unremarkable. No gastric wall thickening. No evidence of outlet obstruction. Duodenum is normally positioned as is the ligament of Treitz. No small bowel or colonic dilatation within the visualized abdomen. Wall thickening in the transverse colon described on the recent CT scan is not well demonstrated by MR. Vascular/Lymphatic: No abdominal aortic aneurysm. No abdominal lymphadenopathy. Other: Trace perihepatic fluid with trace fluid in the gallbladder fossa. Body wall edema noted. Musculoskeletal: Status post lumbosacral fusion. No suspicious marrow enhancement within the visualized bony anatomy. IMPRESSION: 1. Cholelithiasis with small volume pericholecystic fluid. Mild intra and extrahepatic biliary duct dilatation is associated with several apparent tiny granular stones in the common bile duct. 2. Pancreas divisum anatomy with major papilla low on the descending duodenum. 3. Probable tiny bilateral renal cysts incompletely characterized due to  substantial motion degradation on postcontrast imaging today. Electronically Signed   By: Misty Stanley M.D.   On: 12/24/2019 10:43   US Abdomen Limited RUQ  Result Date: 12/23/2019 CLINICAL DATA:  Abdomen pain and vomiting since last night EXAM: ULTRASOUND ABDOMEN LIMITED RIGHT UPPER QUADRANT COMPARISON:  September 24, 2004 FINDINGS: Gallbladder: Gallstones are identified. There is pericholecystic fluid. No sonographic Percell Miller sign is reported by the ultrasound technologist. The gallbladder wall measures 3.4 mm. Common bile duct: Diameter: 10.7 mm Liver: No focal lesion identified. Within normal limits in parenchymal echogenicity. Portal vein is patent on color Doppler imaging with normal direction of blood flow towards the liver. Other: None. IMPRESSION: Gallstones identified in the gallbladder with pericholecystic fluid. No sonographic Percell Miller sign is reported by the ultrasound technologist. The findings are equivocal for acute cholecystitis. Electronically Signed   By: Abelardo Diesel M.D.   On: 12/23/2019 15:56        Scheduled Meds: . [MAR Hold] ARIPiprazole  2 mg Oral QHS  . [MAR Hold] busPIRone  10 mg Oral TID  . [MAR Hold] cholecalciferol  2,000 Units Oral QPC lunch  . [MAR Hold] enoxaparin (LOVENOX) injection  40 mg Subcutaneous QHS  . [MAR Hold] folic acid  1 mg Oral QPC lunch  . [MAR Hold] gabapentin  300 mg Oral TID  . indomethacin  100 mg Rectal Once  . [MAR Hold] morphine  60 mg Oral Q12H  . [MAR Hold] mupirocin ointment  1 application Nasal BID  . [MAR Hold] pantoprazole  40 mg Oral QHS  . [MAR Hold] rosuvastatin  5 mg Oral Q M,W,F-1800  . [MAR Hold] sertraline  200 mg Oral QHS  . [MAR Hold] vitamin B-12  100 mcg Oral QPC lunch   Continuous Infusions: . sodium chloride 50 mL/hr at 12/24/19 2017  . [MAR Hold] ampicillin-sulbactam (UNASYN) IV 3 g (12/25/19 0412)  LOS: 2 days    Time spent: 30 minutes    Amie Cowens Darleen Crocker, DO [facility] Pager  [phone]  If 7PM-7AM, please contact night-coverage [email or web address] Password [facility] 12/25/2019, 8:27 AM

## 2019-12-25 NOTE — Plan of Care (Signed)
  Problem: Pain Managment: Goal: General experience of comfort will improve Outcome: Progressing   Problem: Safety: Goal: Ability to remain free from injury will improve Outcome: Progressing   Problem: Skin Integrity: Goal: Risk for impaired skin integrity will decrease Outcome: Progressing   

## 2019-12-25 NOTE — Progress Notes (Signed)
Patient transported on Bipap from PACU to [id number] without complications.

## 2019-12-25 NOTE — Consult Note (Signed)
NAME:  Denise King, MRN:  [id number], DOB:  06/28/1951, LOS: 2 ADMISSION DATE:  12/23/2019, CONSULTATION DATE:  12/25/2019 REFERRING MD: Silvano Rusk, MD, CHIEF COMPLAINT: Acute hypercarbic respiratory failure  Brief History   68 year old with irritable bowel syndrome, chronic pain syndrome, hypertension, GERD, diverticulosis Admitted with gallstone cholecystitis.  GI and surgery consulted She underwent ERCP on 12/28 under general anesthesia.  PCCM called as she was not breathing after extubation with ABG showing severe hypercarbia (Ph 7, Pco2 > 90, Po2 459) .  Of note end-tidal CO2 during ERCP was in the 90s  Past Medical History   has a past medical history of Acute cystitis, Acute respiratory failure ([place]), Allergy, Anemia, Anxiety, Arthritis, Back pain, Blood transfusion, CAP (community acquired pneumonia), Chronic pain syndrome, Depression, Diverticulosis of colon, Dysthymia, Esophagitis, EtOH dependence ([place]), Gastritis, GERD (gastroesophageal reflux disease), H/O chest pain (Dec. 2013), colonic polyps, Hypertension, Irritable bowel syndrome, Osteopenia, Osteoporosis, Pneumonia (2019), Polypharmacy, Reflux esophagitis, Right sided sciatica (12/22/2017), Tobacco use disorder, Unspecified chronic bronchitis ([place]), Vertigo, Vitamin B12 deficiency, and Wears glasses.  Significant Hospital Events   12/26 Admit 12/28 ERCP, hypercarbic resp failure  Consults:  PCCM, GI, surgery  Procedures:  12/28 ERCP- Common bile duct was mildly dilated. No stones seen  Significant Diagnostic Tests:  12/17 MRCP- Cholecystitis, Cholelithiasis with small volume pericholecystic fluid. Mild intra and extrahepatic biliary duct dilatation  Micro Data:  Bcx X 2 12/28 >>  Antimicrobials:  Unasyn 12/26 >>  Interim history/subjective:    Objective   Blood pressure (!) 157/72, pulse 90, temperature (!) 97 F (36.1 C), temperature source Axillary, resp. rate 20, height 5\' 3"  (1.6 m), weight 48.5  kg, SpO2 99 %.    FiO2 (%):  [100 %] 100 %   Intake/Output Summary (Last 24 hours) at 12/25/2019 1109 Last data filed at 12/25/2019 1013 Gross per 24 hour  Intake 750 ml  Output --  Net 750 ml   Filed Weights   12/23/19 1225  Weight: 48.5 kg    Examination: Gen:      No acute distress HEENT:  EOMI, sclera anicteric Neck:     No masses; no thyromegaly Lungs:    Diminished air entry CV:         Regular rate and rhythm; no murmurs Abd:      + bowel sounds; soft, non-tender; no palpable masses, no distension Ext:    No edema; adequate peripheral perfusion Skin:      Warm and dry; no rash Neuro: Unresponsive to stimuli  Resolved Hospital Problem list     Assessment & Plan:  Acute hypercarbic respiratory failure Likely secondary to sedation.  Patient is on multiple pain medications as an outpatient Continue on BiPAP.  Adjusted inspiratory pressure to ensure adequate tidal volume and minute ventilation We will repeat ABG in 1 to 2 hours Intubate if not improving Get chest x-ray now  Acute encephalopathy secondary to hypercarbia Hold all pain medication and sedating medication  Cholecystitis Status post ERCP Continue Unasyn Blood cultures  Best practice:  Diet: NPO Pain/Anxiety/Delirium protocol (if indicated): NA VAP protocol (if indicated): NA DVT prophylaxis: Lovenox GI prophylaxis: PPI Glucose control: Monitor sugars Mobility: Bed Code Status: Full Family Communication: Husband updated by Dr. Carlean Purl Disposition: ICU  Labs   CBC: Recent Labs  Lab 12/23/19 0948 12/24/19 0500  WBC 6.4 6.0  HGB 15.8* 14.4  HCT 49.9* 45.6  MCV 101.6* 102.5*  PLT 181 0000000    Basic Metabolic Panel: Recent Labs  Lab 12/23/19 0948 12/24/19 0500  NA 135 138  K 3.9 3.2*  CL 92* 99  CO2 32 31  GLUCOSE 121* 83  BUN <5* <5*  CREATININE 0.85 0.71  CALCIUM 8.6* 8.0*   GFR: Estimated Creatinine Clearance: 53 mL/min (by C-G formula based on SCr of 0.71 mg/dL). Recent  Labs  Lab 12/23/19 0948 12/24/19 0500  WBC 6.4 6.0    Liver Function Tests: Recent Labs  Lab 12/23/19 0948 12/24/19 0500  AST 36 20  ALT 12 9  ALKPHOS 97 80  BILITOT 0.5 0.9  PROT 5.8* 5.0*  ALBUMIN 3.3* 2.6*   Recent Labs  Lab 12/23/19 0948  LIPASE 20   No results for input(s): AMMONIA in the last 168 hours.  ABG No results found for: PHART, PCO2ART, PO2ART, HCO3, TCO2, ACIDBASEDEF, O2SAT   Coagulation Profile: No results for input(s): INR, PROTIME in the last 168 hours.  Cardiac Enzymes: No results for input(s): CKTOTAL, CKMB, CKMBINDEX, TROPONINI in the last 168 hours.  HbA1C: Hgb A1c MFr Bld  Date/Time Value Ref Range Status  10/01/2018 02:53 AM 5.2 4.8 - 5.6 % Final    Comment:    (NOTE)         Prediabetes: 5.7 - 6.4         Diabetes: >6.4         Glycemic control for adults with diabetes: <7.0   12/22/2017 04:50 PM 4.9 4.6 - 6.5 % Final    Comment:    Glycemic Control Guidelines for People with Diabetes:Non Diabetic:  <6%Goal of Therapy: <7%Additional Action Suggested:  >8%     CBG: No results for input(s): GLUCAP in the last 168 hours.  Review of Systems:   Unable to obtain due to altered mental status  Past Medical History  She,  has a past medical history of Acute cystitis, Acute respiratory failure ([place]), Allergy, Anemia, Anxiety, Arthritis, Back pain, Blood transfusion, CAP (community acquired pneumonia), Chronic pain syndrome, Depression, Diverticulosis of colon, Dysthymia, Esophagitis, EtOH dependence ([place]), Gastritis, GERD (gastroesophageal reflux disease), H/O chest pain (Dec. 2013), colonic polyps, Hypertension, Irritable bowel syndrome, Osteopenia, Osteoporosis, Pneumonia (2019), Polypharmacy, Reflux esophagitis, Right sided sciatica (12/22/2017), Tobacco use disorder, Unspecified chronic bronchitis ([place]), Vertigo, Vitamin B12 deficiency, and Wears glasses.   Surgical History    Past Surgical History:  Procedure Laterality Date  .  APPENDECTOMY    . BACK SURGERY  10-09   Dr. Patrice Paradise  . CARPAL TUNNEL RELEASE Right 08/14/2014   Procedure: RIGHT CARPAL TUNNEL RELEASE AND INJECT LEFT THUMB;  Surgeon: Daryll Brod, MD;  Location: [facility];  Service: Orthopedics;  Laterality: Right;  . COLONOSCOPY    . INTRAMEDULLARY (IM) NAIL INTERTROCHANTERIC Right 10/01/2018   Procedure: INTRAMEDULLARY (IM) NAIL INTERTROCHANTRIC;  Surgeon: Thornton Park, MD;  Location: [facility];  Service: Orthopedics;  Laterality: Right;  . LAPAROSCOPY N/A 02/21/2013   Procedure: LAPAROSCOPY OPERATIVE;  Surgeon: Margarette Asal, MD;  Location: [facility];  Service: Gynecology;  Laterality: N/A;  REQUESTING 5MM SCOPE WITH CAMERA  . LEFT HEART CATH AND CORONARY ANGIOGRAPHY N/A 05/11/2019   Procedure: LEFT HEART CATH AND CORONARY ANGIOGRAPHY;  Surgeon: Sherren Mocha, MD;  Location: [facility];  Service: Cardiovascular;  Laterality: N/A;  . TONSILLECTOMY    . TUBAL LIGATION    . UPPER GASTROINTESTINAL ENDOSCOPY       Social History   reports that she has been smoking cigarettes. She has a 30.00 pack-year smoking history. She has never used smokeless  tobacco. She reports current alcohol use of about 3.0 standard drinks of alcohol per week. She reports that she does not use drugs.   Family History   Her family history includes Alcohol abuse in her daughter; Bipolar disorder in her daughter; Colon cancer in her brother and father; Drug abuse in her daughter; Heart disease in her mother; Prostate cancer in her father; Skin cancer in her sister. There is no history of Esophageal cancer, Rectal cancer, or Stomach cancer.   Allergies Allergies  Allergen Reactions  . Atorvastatin Nausea And Vomiting  . Levofloxacin Other (See Comments)    Reaction: achilles tendon pain  . Omnicef [Cefdinir] Diarrhea    Uncontrollable diarrhea  . Trazodone And Nefazodone Other (See Comments)    "Felt like I was going to faint"  . Sulfa Antibiotics Rash  .  Sulfonamide Derivatives Rash     Home Medications  Prior to Admission medications   Medication Sig Start Date End Date Taking? Authorizing Provider  ARIPiprazole (ABILIFY) 2 MG tablet Take 1 tablet (2 mg total) by mouth daily. Patient taking differently: Take 2 mg by mouth at bedtime.  12/19/19 02/17/20 Yes Pucilowski, Marchia Bond, MD  aspirin 81 MG chewable tablet Chew 1 tablet (81 mg total) by mouth daily. 04/12/19  Yes Sheikh, Omair Latif, DO  busPIRone (BUSPAR) 10 MG tablet Take 1 tablet (10 mg total) by mouth 3 (three) times daily. 12/19/19  Yes Pucilowski, Olgierd A, MD  Cholecalciferol (VITAMIN D) 50 MCG (2000 UT) tablet Take 2,000 Units by mouth daily after lunch.   Yes [provider]  cholestyramine (QUESTRAN) 4 GM/DOSE powder Take 1 packet (4 g total) by mouth 2 (two) times daily with a meal. Patient taking differently: Take 4 g by mouth 2 (two) times daily as needed ([name] (take with meals)).  03/24/19  Yes Gatha Mayer, MD  denosumab (PROLIA) 60 MG/ML SOSY injection Inject 60 mg into the skin every 6 (six) months.   Yes [provider]  diphenhydramine-acetaminophen (TYLENOL PM) 25-500 MG TABS tablet Take 2 tablets by mouth at bedtime as needed (sleep).   Yes [provider]  doxylamine, Sleep, (UNISOM) 25 MG tablet Take 25 mg by mouth at bedtime.    Yes [provider]  folic acid (FOLVITE) 1 MG tablet Take 1 tablet (1 mg total) by mouth daily. Patient taking differently: Take 1 mg by mouth daily after lunch.  09/05/18  Yes Biagio Borg, MD  gabapentin (NEURONTIN) 300 MG capsule Take 300 mg by mouth 3 (three) times daily.   Yes [provider]  hydrOXYzine (VISTARIL) 25 MG capsule Take 1 capsule (25 mg total) by mouth every 8 (eight) hours as needed for anxiety. 12/19/19 03/18/20 Yes Pucilowski, Olgierd A, MD  hyoscyamine (ANASPAZ) 0.125 MG TBDP disintergrating tablet Place 0.125 mg under the tongue every 6 (six) hours as needed ([name] cramps).     Yes [provider]  morphine (MS CONTIN) 60 MG 12 hr tablet Take 60 mg by mouth every 12 (twelve) hours. 05/02/19  Yes [provider]  Oxycodone HCl 10 MG TABS Take 10 mg by mouth 4 (four) times daily.  03/06/19  Yes [provider]  pantoprazole (PROTONIX) 40 MG tablet Take 1 tablet (40 mg total) by mouth daily. Patient taking differently: Take 40 mg by mouth at bedtime.  04/12/19  Yes Sheikh, [place], DO  rosuvastatin (CRESTOR) 5 MG tablet Take 1 tablet (5 mg total) by mouth every Monday, Wednesday, and Friday  at 6 PM. 08/30/19 01/28/20 Yes Josue Hector, MD  sertraline (ZOLOFT) 100 MG tablet Take 2 tablets (200 mg total) by mouth at bedtime. 12/19/19 03/18/20 Yes Pucilowski, Olgierd A, MD  triamcinolone cream (KENALOG) 0.1 % Apply 1 application topically 2 (two) times daily as needed (itching).  03/14/19  Yes [provider]  valACYclovir (VALTREX) 500 MG tablet Take 500 mg by mouth 2 (two) times daily as needed (flare ups).   Yes [provider]  vitamin B-12 (CYANOCOBALAMIN) 100 MCG tablet Take 1 tablet (100 mcg total) by mouth daily. Patient taking differently: Take 100 mcg by mouth daily after lunch.  11/03/18  Yes Biagio Borg, MD     Critical care time:    The patient is critically ill with multiple organ system failure and requires high complexity decision making for assessment and support, frequent evaluation and titration of therapies, advanced monitoring, review of radiographic studies and interpretation of complex data.   Critical Care Time devoted to patient care services, exclusive of separately billable procedures, described in this note is 35 minutes.   Marshell Garfinkel MD [facility] Please see [email or web address] for pager details.  12/25/2019, 11:25 AM

## 2019-12-25 NOTE — Progress Notes (Signed)
RT called by CRNA to place patient on BiPAP. When I arrived, patient on NRB with oral airway in place, SAT 84%. BiPAP placed, not breathing much over set rate of 20. 20/6 used to achieve VT of 400. MD aware she is not breathing over. Wants to give her 1-2 hours and repeat ABG. He is satisfied with VE at this point and wants to give her a chance. RT wil continue to monitor.

## 2019-12-25 NOTE — Anesthesia Procedure Notes (Signed)
Procedure Name: Intubation Date/Time: 12/25/2019 9:17 AM Performed by: Moshe Salisbury, CRNA Pre-anesthesia Checklist: Patient identified, Emergency Drugs available, Suction available and Patient being monitored Patient Re-evaluated:Patient Re-evaluated prior to induction Oxygen Delivery Method: Circle System Utilized Preoxygenation: Pre-oxygenation with 100% oxygen Induction Type: IV induction, Rapid sequence and Cricoid Pressure applied Laryngoscope Size: Mac and 3 Grade View: Grade I Tube type: Oral Tube size: 7.5 mm Number of attempts: 1 Airway Equipment and Method: Stylet Placement Confirmation: ETT inserted through vocal cords under direct vision,  positive ETCO2 and breath sounds checked- equal and bilateral Secured at: 20 cm Tube secured with: Tape Dental Injury: Teeth and Oropharynx as per pre-operative assessment

## 2019-12-25 NOTE — Progress Notes (Signed)
Daughter updated regarding patient condition. 

## 2019-12-25 NOTE — Progress Notes (Signed)
ABG on record is the 1 hr post BiPAP ABG. Initial ABG did not cross over. Initial pH was 7.01 and CO2 was undetectable on istat. ABG is slightly improved, but CO2 still over 90.

## 2019-12-26 DIAGNOSIS — R1010 Upper abdominal pain, unspecified: Secondary | ICD-10-CM

## 2019-12-26 DIAGNOSIS — K8000 Calculus of gallbladder with acute cholecystitis without obstruction: Secondary | ICD-10-CM

## 2019-12-26 DIAGNOSIS — R112 Nausea with vomiting, unspecified: Secondary | ICD-10-CM

## 2019-12-26 LAB — COMPREHENSIVE METABOLIC PANEL
ALT: 10 U/L (ref 0–44)
AST: 17 U/L (ref 15–41)
Albumin: 2.5 g/dL — ABNORMAL LOW (ref 3.5–5.0)
Alkaline Phosphatase: 68 U/L (ref 38–126)
Anion gap: 8 (ref 5–15)
BUN: 5 mg/dL — ABNORMAL LOW (ref 8–23)
CO2: 33 mmol/L — ABNORMAL HIGH (ref 22–32)
Calcium: 8.2 mg/dL — ABNORMAL LOW (ref 8.9–10.3)
Chloride: 102 mmol/L (ref 98–111)
Creatinine, Ser: 0.68 mg/dL (ref 0.44–1.00)
GFR calc Af Amer: >60 mL/min (ref >60)
GFR calc non Af Amer: >60 mL/min (ref >60)
Glucose, Bld: 119 mg/dL — ABNORMAL HIGH (ref 70–99)
Potassium: 4.3 mmol/L (ref 3.5–5.1)
Sodium: 143 mmol/L (ref 135–145)
Total Bilirubin: 0.8 mg/dL (ref 0.3–1.2)
Total Protein: 5.2 g/dL — ABNORMAL LOW (ref 6.5–8.1)

## 2019-12-26 LAB — POCT I-STAT 7, (LYTES, BLD GAS, ICA,H+H)
Acid-Base Excess: 6 mmol/L — ABNORMAL HIGH (ref 0.0–2.0)
Bicarbonate: 37.6 mmol/L — ABNORMAL HIGH (ref 20.0–28.0)
Calcium, Ion: 1.18 mmol/L (ref 1.15–1.40)
HCT: 51 % — ABNORMAL HIGH (ref 36.0–46.0)
Hemoglobin: 17.3 g/dL — ABNORMAL HIGH (ref 12.0–15.0)
O2 Saturation: 100 %
Patient temperature: 99.8
Potassium: 4.2 mmol/L (ref 3.5–5.1)
Sodium: 138 mmol/L (ref 135–145)
TCO2: 40 mmol/L — ABNORMAL HIGH (ref 22–32)
pCO2 arterial: 91.1 mmHg (ref 32.0–48.0)
pH, Arterial: 7.227 — ABNORMAL LOW (ref 7.350–7.450)
pO2, Arterial: 217 mmHg — ABNORMAL HIGH (ref 83.0–108.0)

## 2019-12-26 LAB — CBC
HCT: 48.2 % — ABNORMAL HIGH (ref 36.0–46.0)
Hemoglobin: 14.7 g/dL (ref 12.0–15.0)
MCH: 32 pg (ref 26.0–34.0)
MCHC: 30.5 g/dL (ref 30.0–36.0)
MCV: 104.8 fL — ABNORMAL HIGH (ref 80.0–100.0)
Platelets: 134 10*3/uL — ABNORMAL LOW (ref 150–400)
RBC: 4.6 MIL/uL (ref 3.87–5.11)
RDW: 13.1 % (ref 11.5–15.5)
WBC: 7.8 10*3/uL (ref 4.0–10.5)
nRBC: 0 % (ref 0.0–0.2)

## 2019-12-26 LAB — SURGICAL PCR SCREEN
MRSA, PCR: NEGATIVE
Staphylococcus aureus: NEGATIVE

## 2019-12-26 LAB — LIPASE, BLOOD: Lipase: 24 U/L (ref 11–51)

## 2019-12-26 LAB — GLUCOSE, CAPILLARY: Glucose-Capillary: 104 mg/dL — ABNORMAL HIGH (ref 70–99)

## 2019-12-26 MED ORDER — LACTATED RINGERS IV SOLN: INTRAVENOUS | Status: DC

## 2019-12-26 MED ORDER — MAGNESIUM SULFATE 2 GM/50ML IV SOLN
2.0000 g | Freq: Once | INTRAVENOUS | Status: AC
  Administered 2019-12-26: 2 g via INTRAVENOUS
  Filled 2019-12-26: qty 50

## 2019-12-26 NOTE — Progress Notes (Addendum)
NAME:  Denise King, MRN:  [id number], DOB:  1951-10-19, LOS: 3 ADMISSION DATE:  12/23/2019, CONSULTATION DATE:  12/25/2019 REFERRING MD: Silvano Rusk, MD, CHIEF COMPLAINT: Acute hypercarbic respiratory failure  Brief History   68 year old with irritable bowel syndrome, chronic pain syndrome, hypertension, GERD, diverticulosis. Admitted with gallstone cholecystitis.  GI and surgery consulted She underwent ERCP on 12/28 under general anesthesia.  PCCM called as she was not breathing after extubation with ABG showing severe hypercarbia (Ph 7, Pco2 > 90, Po2 459) .  Of note end-tidal CO2 during ERCP was in the 90s  Past Medical History   has a past medical history of Acute cystitis, Acute respiratory failure ([place]), Allergy, Anemia, Anxiety, Arthritis, Back pain, Blood transfusion, CAP (community acquired pneumonia), Chronic pain syndrome, Depression, Diverticulosis of colon, Dysthymia, Esophagitis, EtOH dependence ([place]), Gastritis, GERD (gastroesophageal reflux disease), H/O chest pain (Dec. 2013), colonic polyps, Hypertension, Irritable bowel syndrome, Osteopenia, Osteoporosis, Pneumonia (2019), Polypharmacy, Reflux esophagitis, Right sided sciatica (12/22/2017), Tobacco use disorder, Unspecified chronic bronchitis ([place]), Vertigo, Vitamin B12 deficiency, and Wears glasses.  Significant Hospital Events   12/26 Admit 12/28 ERCP, hypercarbic resp failure  Consults:  PCCM, GI, surgery  Procedures:  12/28 ERCP- Common bile duct was mildly dilated. No stones seen  Significant Diagnostic Tests:  12/17 MRCP- Cholecystitis, Cholelithiasis with small volume pericholecystic fluid. Mild intra and extrahepatic biliary duct dilatation  Micro Data:  Bcx X 2 12/28 >>  Antimicrobials:  Unasyn 12/26 >>  Interim history/subjective:  Doing well this am on 4lpm O2, SpO2 98%.   Objective   Blood pressure 120/80, pulse 74, temperature 98.2 F (36.8 C), temperature source Oral, resp. rate (!) 27,  height 5\' 3"  (1.6 m), weight 48.5 kg, SpO2 100 %.    FiO2 (%):  [50 %-100 %] 50 %   Intake/Output Summary (Last 24 hours) at 12/26/2019 0810 Last data filed at 12/26/2019 0300 Gross per 24 hour  Intake 5160.05 ml  Output 500 ml  Net 4660.05 ml   Filed Weights   12/23/19 1225  Weight: 48.5 kg    Examination: General: Well-developed, well nourished. NAD HENT: Normocephalic, PERRL. Moist mucus membranes Neck: No JVD. Trachea midline. No thyromegaly, no lymphadenopathy CV: RRR. S1S2. No MRG. +2 distal pulses Lungs: BBS present, clear, FNL, symmetrical ABD: +BS x4. SNT/ND. No masses, guarding or rigidity GU: No Foley EXT: MAE well. Trace edema to hands Skin: PWD. In tact. No rashes or lesions Neuro: A&Ox3. CN II-XII in tact. No focal deficits Psych: Appropriate mood, insight and judgment for time and situation     Resolved Hospital Problem list     Assessment & Plan:  Acute hypercarbic respiratory failure-resolved  Likely secondary to sedation.  Patient is on multiple pain medications as an outpatient. Off BiPAP since yesterday afternoon. No respiratory distress.  Plan: Wean O2 for goal SpO2 >/=92% IS, bronchial hygiene   Acute encephalopathy secondary to hypercarbia-resolved.    Cholecystitis-per surgery  Status post ERCP Continue Unasyn Blood cultures  Best practice: per primary   Diet: NPO Pain/Anxiety/Delirium protocol (if indicated): NA VAP protocol (if indicated): NA DVT prophylaxis: Lovenox GI prophylaxis: PPI Glucose control: Monitor sugars Mobility: OOB  Code Status: Full Family Communication: Husband updated by Dr. Carlean Purl Disposition: may transfer out of ICU from PCCM perspective.     Labs   CBC: Recent Labs  Lab 12/23/19 0948 12/24/19 0500 12/25/19 1148 12/25/19 1553 12/26/19 0259  WBC 6.4 6.0  --   --  7.8  HGB 15.8* 14.4  18.4* 16.7* 14.7  HCT 49.9* 45.6 54.0* 49.0* 48.2*  MCV 101.6* 102.5*  --   --  104.8*  PLT 181 153  --   --   134*    Basic Metabolic Panel: Recent Labs  Lab 12/23/19 0948 12/24/19 0500 12/25/19 1148 12/25/19 1534 12/25/19 1553 12/26/19 0259  NA 135 138 138  --  140 143  K 3.9 3.2* 3.8  --  3.9 4.3  CL 92* 99  --   --   --  102  CO2 32 31  --   --   --  33*  GLUCOSE 121* 83  --   --   --  119*  BUN <5* <5*  --   --   --  5*  CREATININE 0.85 0.71  --   --   --  0.68  CALCIUM 8.6* 8.0*  --   --   --  8.2*  MG  --   --   --  1.5*  --   --    GFR: Estimated Creatinine Clearance: 53 mL/min (by C-G formula based on SCr of 0.68 mg/dL). Recent Labs  Lab 12/23/19 0948 12/24/19 0500 12/25/19 1534 12/26/19 0259  WBC 6.4 6.0  --  7.8  LATICACIDVEN  --   --  1.4  --     Liver Function Tests: Recent Labs  Lab 12/23/19 0948 12/24/19 0500 12/26/19 0259  AST 36 20 17  ALT 12 9 10   ALKPHOS 97 80 68  BILITOT 0.5 0.9 0.8  PROT 5.8* 5.0* 5.2*  ALBUMIN 3.3* 2.6* 2.5*   Recent Labs  Lab 12/23/19 0948 12/26/19 0259  LIPASE 20 24   No results for input(s): AMMONIA in the last 168 hours.  ABG    Component Value Date/Time   PHART 7.355 12/25/2019 1553   PCO2ART 59.8 (H) 12/25/2019 1553   PO2ART 81.0 (L) 12/25/2019 1553   HCO3 33.4 (H) 12/25/2019 1553   TCO2 35 (H) 12/25/2019 1553   O2SAT 95.0 12/25/2019 1553     Coagulation Profile: No results for input(s): INR, PROTIME in the last 168 hours.  Cardiac Enzymes: No results for input(s): CKTOTAL, CKMB, CKMBINDEX, TROPONINI in the last 168 hours.  HbA1C: Hgb A1c MFr Bld  Date/Time Value Ref Range Status  10/01/2018 02:53 AM 5.2 4.8 - 5.6 % Final    Comment:    (NOTE)         Prediabetes: 5.7 - 6.4         Diabetes: >6.4         Glycemic control for adults with diabetes: <7.0   12/22/2017 04:50 PM 4.9 4.6 - 6.5 % Final    Comment:    Glycemic Control Guidelines for People with Diabetes:Non Diabetic:  <6%Goal of Therapy: <7%Additional Action Suggested:  >8%     CBG: No results for input(s): GLUCAP in the last 168  hours.  Review of Systems:   Mild ABD pain, swelling   PCCM will sign off. Thank you for the opportunity to participate in this patient's care. Please contact if we can be of further assistance.  Francine Graven, MSN, AGACNP  [facility]

## 2019-12-26 NOTE — Progress Notes (Signed)
[facility] Progress Note Patient Name: Denise King DOB: 1951/09/09 MRN: [id number]   Date of Service  12/26/2019  HPI/Events of Note  PT with recurrence of hypercapnic respiratory failure this PM, CO2 91, she has been placed back on BIPAP.  eICU Interventions  Continue BIPAP overnight, repeat ABG in a.m.        Frederik Pear 12/26/2019, 9:33 PM

## 2019-12-26 NOTE — Progress Notes (Signed)
PROGRESS NOTE    Denise King  [id number] DOB: 08/15/1951 DOA: 12/23/2019 PCP: Biagio Borg, MD   Brief Narrative:  Patient is a 68 year old Caucasian female with past medical history significant for irritable bowel syndrome, chronic pain syndrome, hypertension, GERD, alcohol], diverticulosis, anxiety and anemia. Patient was admitted with gallstone cholecystitis. MRI of the abdomen revealed gallstones in the common bile duct. GI team has been consulted. For possible ERCP tomorrow. Surgical team has also been consulted. Patient is currently pain-free. No nausea or vomiting at the moment. Further management depend on hospital course.  12/28: Patient is noted to be quite sleepy this morning.  She is arousable and oriented x3 however.  Plans are for ERCP today and possible cholecystectomy thereafter.  She remains on Unasyn.  12/29: Patient is more awake and alert this morning and appears to be back to baseline.  She is still on 4-5 L nasal cannula oxygen and is no longer requiring BiPAP.  Her PCO2 levels have improved dramatically with use of BiPAP overnight.  General surgery following with plans for lap chole soon.  Continue Unasyn.  Assessment & Plan:   Principal Problem:   Nausea & vomiting Active Problems:   Chronic pain syndrome   Common bile duct stone   Acute cholecystitis   Abnormal MRI of abdomen   Cholelithiasis  Acute hypercapnic respiratory failure with associated encephalopathy-resolved -This has resolved after use of BiPAP overnight -Appreciate PCCM involvement -She appears to be back to her usual baseline and is currently on 4-5 L nasal cannula oxygen which I will order to wean -Transfer to telemetry today -Continue close monitoring of narcotic use; currently denies any excessive use  Acute gallstone cholecystitis: -MRI of the abdomen reveals common bile duct stones. -ERCP 12/28 per GI -Cholecystectomy planning per general surgery -Continue IV  Unasyn  Nausea and vomiting: -Likely related to above. -No nausea or vomiting at the moment. -Continue to manage expectantly.  Mild hypokalemia-resolved -Replete and recheck in a.m. along with magnesium  Chronic pain syndrome: -Pain is currently controlled, but patient is somnolent -Need to carefully monitor pain medication regimen and avoid for excessive somnolence  Documented hypertension: -Blood pressures beginning to elevate -We will order labetalol as needed   DVT prophylaxis: Lovenox Code Status: Full Family Communication: None at bedside Disposition Plan:  Plan to transfer to telemetry today.  Continue IV Unasyn.  Further planning of lap chole per general surgery   Consultants:   GI  General surgery  Procedures:   Planned ERCP 12/28  Antimicrobials:  Anti-infectives (From admission, onward)   Start     Dose/Rate Route Frequency Ordered Stop   12/25/19 0930  Ampicillin-Sulbactam (UNASYN) 3 g in sodium chloride 0.9 % 100 mL IVPB     3 g 200 mL/hr over 30 Minutes Intravenous  Once 12/25/19 0928 12/25/19 0958   12/23/19 2115  Ampicillin-Sulbactam (UNASYN) 3 g in sodium chloride 0.9 % 100 mL IVPB     3 g 200 mL/hr over 30 Minutes Intravenous Every 6 hours 12/23/19 2100     12/23/19 2103  valACYclovir (VALTREX) tablet 500 mg     500 mg Oral 2 times daily PRN 12/23/19 2104         Subjective: Patient seen and evaluated today with no new acute complaints or concerns. No acute concerns or events noted overnight.  She appears to be back to her usual baseline this morning and is certainly more awake and alert.  Objective: Vitals:   12/26/19 0100 12/26/19  0200 12/26/19 0300 12/26/19 0313  BP: 132/70 129/66 120/80   Pulse: 67 71 74   Resp: (!) 22 17 (!) 27   Temp:    98.2 F (36.8 C)  TempSrc:    Oral  SpO2: 95% 95% 100%   Weight:      Height:        Intake/Output Summary (Last 24 hours) at 12/26/2019 0824 Last data filed at 12/26/2019  0300 Gross per 24 hour  Intake 5160.05 ml  Output 500 ml  Net 4660.05 ml   Filed Weights   12/23/19 1225  Weight: 48.5 kg    Examination:  General exam: Appears calm and comfortable  Respiratory system: Clear to auscultation. Respiratory effort normal.  Currently on 4-5 L nasal cannula oxygen. Cardiovascular system: S1 & S2 heard, RRR. No JVD, murmurs, rubs, gallops or clicks. No pedal edema. Gastrointestinal system: Abdomen is nondistended, soft and nontender. No organomegaly or masses felt. Normal bowel sounds heard. Central nervous system: Alert and oriented. No focal neurological deficits. Extremities: Symmetric 5 x 5 power. Skin: No rashes, lesions or ulcers Psychiatry: Judgement and insight appear normal. Mood & affect appropriate.     Data Reviewed: I have personally reviewed following labs and imaging studies  CBC: Recent Labs  Lab 12/23/19 0948 12/24/19 0500 12/25/19 1148 12/25/19 1553 12/26/19 0259  WBC 6.4 6.0  --   --  7.8  HGB 15.8* 14.4 18.4* 16.7* 14.7  HCT 49.9* 45.6 54.0* 49.0* 48.2*  MCV 101.6* 102.5*  --   --  104.8*  PLT 181 153  --   --  [id number]*   Basic Metabolic Panel: Recent Labs  Lab 12/23/19 0948 12/24/19 0500 12/25/19 1148 12/25/19 1534 12/25/19 1553 12/26/19 0259  NA 135 138 138  --  140 143  K 3.9 3.2* 3.8  --  3.9 4.3  CL 92* 99  --   --   --  102  CO2 32 31  --   --   --  33*  GLUCOSE 121* 83  --   --   --  119*  BUN <5* <5*  --   --   --  5*  CREATININE 0.85 0.71  --   --   --  0.68  CALCIUM 8.6* 8.0*  --   --   --  8.2*  MG  --   --   --  1.5*  --   --    GFR: Estimated Creatinine Clearance: 53 mL/min (by C-G formula based on SCr of 0.68 mg/dL). Liver Function Tests: Recent Labs  Lab 12/23/19 0948 12/24/19 0500 12/26/19 0259  AST 36 20 17  ALT 12 9 10   ALKPHOS 97 80 68  BILITOT 0.5 0.9 0.8  PROT 5.8* 5.0* 5.2*  ALBUMIN 3.3* 2.6* 2.5*   Recent Labs  Lab 12/23/19 0948 12/26/19 0259  LIPASE 20 24   No results  for input(s): AMMONIA in the last 168 hours. Coagulation Profile: No results for input(s): INR, PROTIME in the last 168 hours. Cardiac Enzymes: No results for input(s): CKTOTAL, CKMB, CKMBINDEX, TROPONINI in the last 168 hours. BNP (last 3 results) No results for input(s): PROBNP in the last 8760 hours. HbA1C: No results for input(s): HGBA1C in the last 72 hours. CBG: No results for input(s): GLUCAP in the last 168 hours. Lipid Profile: No results for input(s): CHOL, HDL, LDLCALC, TRIG, CHOLHDL, LDLDIRECT in the last 72 hours. Thyroid Function Tests: No results for input(s): TSH, T4TOTAL, FREET4, T3FREE, THYROIDAB in  the last 72 hours. Anemia Panel: No results for input(s): VITAMINB12, FOLATE, FERRITIN, TIBC, IRON, RETICCTPCT in the last 72 hours. Sepsis Labs: Recent Labs  Lab 12/25/19 1534  LATICACIDVEN 1.4    Recent Results (from the past 240 hour(s))  SARS CORONAVIRUS 2 (TAT 6-24 HRS) Nasopharyngeal Nasopharyngeal Swab     Status: None   Collection Time: 12/23/19  3:21 PM   Specimen: Nasopharyngeal Swab  Result Value Ref Range Status   SARS Coronavirus 2 NEGATIVE NEGATIVE Final    Comment: (NOTE) SARS-CoV-2 target nucleic acids are NOT DETECTED. The SARS-CoV-2 RNA is generally detectable in upper and lower respiratory specimens during the acute phase of infection. Negative results do not preclude SARS-CoV-2 infection, do not rule out co-infections with other pathogens, and should not be used as the sole basis for treatment or other patient management decisions. Negative results must be combined with clinical observations, patient history, and epidemiological information. The expected result is Negative. Fact Sheet for Patients: SugarRoll.be Fact Sheet for Healthcare Providers: https://www.woods-mathews.com/ This test is not yet approved or cleared by the [place] FDA and  has been authorized for detection and/or diagnosis of  SARS-CoV-2 by FDA under an Emergency Use Authorization (EUA). This EUA will remain  in effect (meaning this test can be used) for the duration of the COVID-19 declaration under Section 56 4(b)(1) of the Act, 21 U.S.C. section 360bbb-3(b)(1), unless the authorization is terminated or revoked sooner. Performed at [facility], [place] [address], [place], [place] [address]   MRSA PCR Screening     Status: None   Collection Time: 12/25/19  2:12 PM   Specimen: Nasal Mucosa; Nasopharyngeal  Result Value Ref Range Status   MRSA by PCR NEGATIVE NEGATIVE Final    Comment:        The GeneXpert MRSA Assay (FDA approved for NASAL specimens only), is one component of a comprehensive MRSA colonization surveillance program. It is not intended to diagnose MRSA infection nor to guide or monitor treatment for MRSA infections. Performed at [facility], [place] [address] [place],  [address]          Radiology Studies: MR 3D Recon At Scanner  Result Date: 12/24/2019 CLINICAL DATA:  Gallstones with biliary dilatation. EXAM: MRI ABDOMEN WITHOUT AND WITH CONTRAST (INCLUDING MRCP) TECHNIQUE: Multiplanar multisequence MR imaging of the abdomen was performed both before and after the administration of intravenous contrast. Heavily T2-weighted images of the biliary and pancreatic ducts were obtained, and three-dimensional MRCP images were rendered by post processing. CONTRAST:  36mL GADAVIST GADOBUTROL 1 MMOL/ML IV SOLN COMPARISON:  Abdomen/pelvis CT 12/23/2019. Abdominal ultrasound 12/23/2019. FINDINGS: Lower chest: Dependent atelectasis with tiny bilateral effusions. Hepatobiliary: Tiny cyst noted in the anterior left liver. No suspicious enhancing liver abnormality. Gallbladder is distended with small volume pericholecystic fluid. Several tiny/granular stones are visualized in the gallbladder lumen (image 12 coronal T2 series 12). Common bile duct in the head of pancreas measures up to 8-9  mm diameter with some dependent sludge and probable tiny dependent stones (coronal T2 image 9 of series 12). Common bile duct inserts low on the descending duodenum. Pancreas: Pancreas divisum ductal anatomy. No evidence for pancreatic mass lesion although assessment on postcontrast imaging is motion degraded. Spleen:  No splenomegaly. No focal mass lesion. Adrenals/Urinary Tract: No adrenal nodule or mass. Tiny T2 hyperintense lesions in the cortex of both kidney show no appreciable enhancement after IV contrast administration, but assessment on postcontrast imaging is markedly motion degraded. Stomach/Bowel:  Stomach is unremarkable. No gastric wall thickening. No evidence of outlet obstruction. Duodenum is normally positioned as is the ligament of Treitz. No small bowel or colonic dilatation within the visualized abdomen. Wall thickening in the transverse colon described on the recent CT scan is not well demonstrated by MR. Vascular/Lymphatic: No abdominal aortic aneurysm. No abdominal lymphadenopathy. Other: Trace perihepatic fluid with trace fluid in the gallbladder fossa. Body wall edema noted. Musculoskeletal: Status post lumbosacral fusion. No suspicious marrow enhancement within the visualized bony anatomy. IMPRESSION: 1. Cholelithiasis with small volume pericholecystic fluid. Mild intra and extrahepatic biliary duct dilatation is associated with several apparent tiny granular stones in the common bile duct. 2. Pancreas divisum anatomy with major papilla low on the descending duodenum. 3. Probable tiny bilateral renal cysts incompletely characterized due to substantial motion degradation on postcontrast imaging today. Electronically Signed   By: Misty Stanley M.D.   On: 12/24/2019 10:43   DG CHEST PORT 1 VIEW  Result Date: 12/25/2019 CLINICAL DATA:  Acute respiratory failure, post ERCP, BIPAP EXAM: PORTABLE CHEST 1 VIEW COMPARISON:  CT chest 12/23/2019, chest radiograph 12/23/2019 FINDINGS: Heart size  at the upper limits of normal, unchanged. Aortic atherosclerosis. Significantly increased from prior examinations, there is bibasilar opacity consistent with atelectasis and/or pneumonia with possible small effusions. No evidence of pneumothorax. Upper thoracic levocurvature. Overlying cardiac monitoring leads. IMPRESSION: Increasing bibasilar opacities consistent with atelectasis and/or pneumonia with possible small effusions. Electronically Signed   By: Kellie Simmering DO   On: 12/25/2019 11:10   DG ERCP sphincterotomy  Result Date: 12/25/2019 CLINICAL DATA:  ERCP for choledocholithiasis. EXAM: ERCP TECHNIQUE: Multiple spot images obtained with the fluoroscopic device and submitted for interpretation post-procedure. FLUOROSCOPY TIME:  5 minutes, 39 seconds COMPARISON:  MRCP-12/24/2019 FINDINGS: Eight spot intraoperative fluoroscopic images of the right upper abdominal quadrant during ERCP are provided for review Initial image demonstrates an ERCP probe overlying the right upper abdominal quadrant with selective cannulation opacification of the common bile duct which appears mildly dilated. Subsequent images demonstrate insufflation of a balloon within the mid aspect of the CBD with subsequent biliary sweeping and presumed sphincterotomy. There is passage of contrast through the cystic duct with opacification of the gallbladder. There is minimal opacification of the intrahepatic biliary tree which appears mildly dilated. IMPRESSION: ERCP with biliary sweeping and presumed sphincterotomy as above. These images were submitted for radiologic interpretation only. Please see the procedural report for the amount of contrast and the fluoroscopy time utilized. Electronically Signed   By: Sandi Mariscal M.D.   On: 12/25/2019 10:25   MR ABDOMEN MRCP W WO CONTAST  Result Date: 12/24/2019 CLINICAL DATA:  Gallstones with biliary dilatation. EXAM: MRI ABDOMEN WITHOUT AND WITH CONTRAST (INCLUDING MRCP) TECHNIQUE: Multiplanar  multisequence MR imaging of the abdomen was performed both before and after the administration of intravenous contrast. Heavily T2-weighted images of the biliary and pancreatic ducts were obtained, and three-dimensional MRCP images were rendered by post processing. CONTRAST:  20mL GADAVIST GADOBUTROL 1 MMOL/ML IV SOLN COMPARISON:  Abdomen/pelvis CT 12/23/2019. Abdominal ultrasound 12/23/2019. FINDINGS: Lower chest: Dependent atelectasis with tiny bilateral effusions. Hepatobiliary: Tiny cyst noted in the anterior left liver. No suspicious enhancing liver abnormality. Gallbladder is distended with small volume pericholecystic fluid. Several tiny/granular stones are visualized in the gallbladder lumen (image 12 coronal T2 series 12). Common bile duct in the head of pancreas measures up to 8-9 mm diameter with some dependent sludge and probable tiny dependent stones (coronal T2 image 9 of series 12).  Common bile duct inserts low on the descending duodenum. Pancreas: Pancreas divisum ductal anatomy. No evidence for pancreatic mass lesion although assessment on postcontrast imaging is motion degraded. Spleen:  No splenomegaly. No focal mass lesion. Adrenals/Urinary Tract: No adrenal nodule or mass. Tiny T2 hyperintense lesions in the cortex of both kidney show no appreciable enhancement after IV contrast administration, but assessment on postcontrast imaging is markedly motion degraded. Stomach/Bowel: Stomach is unremarkable. No gastric wall thickening. No evidence of outlet obstruction. Duodenum is normally positioned as is the ligament of Treitz. No small bowel or colonic dilatation within the visualized abdomen. Wall thickening in the transverse colon described on the recent CT scan is not well demonstrated by MR. Vascular/Lymphatic: No abdominal aortic aneurysm. No abdominal lymphadenopathy. Other: Trace perihepatic fluid with trace fluid in the gallbladder fossa. Body wall edema noted. Musculoskeletal: Status post  lumbosacral fusion. No suspicious marrow enhancement within the visualized bony anatomy. IMPRESSION: 1. Cholelithiasis with small volume pericholecystic fluid. Mild intra and extrahepatic biliary duct dilatation is associated with several apparent tiny granular stones in the common bile duct. 2. Pancreas divisum anatomy with major papilla low on the descending duodenum. 3. Probable tiny bilateral renal cysts incompletely characterized due to substantial motion degradation on postcontrast imaging today. Electronically Signed   By: Misty Stanley M.D.   On: 12/24/2019 10:43        Scheduled Meds: . ARIPiprazole  2 mg Oral QHS  . busPIRone  10 mg Oral TID  . chlorhexidine  15 mL Mouth Rinse BID  . Chlorhexidine Gluconate Cloth  6 each Topical Daily  . cholecalciferol  2,000 Units Oral QPC lunch  . enoxaparin (LOVENOX) injection  40 mg Subcutaneous QHS  . folic acid  1 mg Oral QPC lunch  . gabapentin  300 mg Oral TID  . indomethacin  100 mg Rectal Once  . mouth rinse  15 mL Mouth Rinse q12n4p  . morphine  60 mg Oral Q12H  . mupirocin ointment  1 application Nasal BID  . pantoprazole  40 mg Oral QHS  . potassium chloride  40 mEq Oral Once  . rosuvastatin  5 mg Oral Q M,W,F-1800  . sertraline  200 mg Oral QHS  . vitamin B-12  100 mcg Oral QPC lunch   Continuous Infusions: . ampicillin-sulbactam (UNASYN) IV 3 g (12/26/19 0311)  . dextrose 5% lactated ringers with KCl 20 mEq/L 125 mL/hr at 12/26/19 0300     LOS: 3 days    Time spent: 30 minutes    Laycee Fitzsimmons Darleen Crocker, DO [facility] Pager [phone]  If 7PM-7AM, please contact night-coverage [email or web address] Password [facility] 12/26/2019, 8:24 AM

## 2019-12-26 NOTE — Progress Notes (Signed)
Upon arrival on shift, patient is somnolent, lethargic, on 10L NRB and A&O to only self. ABG taken at 1920. PH 7.22, PCO2 91, pO2 217, Bicarb 37.6 TCO2 40, Acid-Base excess 37.6. RN from day shift passed on to this RN per Dr. Wonda Amis to place patient back on BIPAP if PCO2 is elevated. [facility] called and notified of patient's ABG and on BIPAP now due to increased CO2.  Pt tolerating BIPAP at this moment. Will continue to monitor.

## 2019-12-26 NOTE — Progress Notes (Signed)
ABG drawn, critical Pc02 results.  Pt placed on Bipap per MD. Order.  Pt tolerating well. Will continue to monitor.

## 2019-12-26 NOTE — Progress Notes (Addendum)
Progress Note    ASSESSMENT AND PLAN:    68 yo female with cholecystitis / choledocholithiasis s/p ERCP with sphincterotomy and balloon sweep. No stones found. Occlusion cholangiogram negative.   --Respiratory failure requiring Bipap  post ERCP, resolved.  --Surgery following, plan is for cholecystectomy when stable from respiratory standpoint.  --On Unasyn --GI will sign off. Follow up appointment with Dr. Carlean Purl on 2/2 at 10:30am (mainly to follow up on some [name] complaints)    SUBJECTIVE    No abdominal pain. Breathing fine.    OBJECTIVE:     Vital signs in last 24 hours: Temp:  [97 F (36.1 C)-98.7 F (37.1 C)] 98.1 F (36.7 C) (12/29 0901) Pulse Rate:  [59-117] 74 (12/29 0300) Resp:  [9-69] 27 (12/29 0300) BP: (106-187)/(50-95) 120/80 (12/29 0300) SpO2:  [75 %-100 %] 100 % (12/29 0300) FiO2 (%):  [50 %-100 %] 50 % (12/28 1400) Last BM Date: 12/25/19 General:   Alert, well-developed female in NAD EENT:  Normal hearing, non icteric sclera   Heart:  Regular rate and rhythm;  No lower extremity edema   Pulm: Normal respiratory effort   Abdomen:  Soft, nondistended, nontender.  Normal bowel sounds.          Neurologic:  Alert and  oriented x4;  grossly normal neurologically. Psych:  Pleasant, cooperative.  Normal mood and affect.   Intake/Output from previous day: 12/28 0701 - 12/29 0700 In: 5160.1 [I.V.:3625.2; IV Piggyback:1534.9] Out: 500 [Urine:500] Intake/Output this shift: No intake/output data recorded.  Lab Results: Recent Labs    12/23/19 0948 12/24/19 0500 12/25/19 1148 12/25/19 1553 12/26/19 0259  WBC 6.4 6.0  --   --  7.8  HGB 15.8* 14.4 18.4* 16.7* 14.7  HCT 49.9* 45.6 54.0* 49.0* 48.2*  PLT 181 153  --   --  134*   BMET Recent Labs    12/23/19 0948 12/24/19 0500 12/25/19 1148 12/25/19 1553 12/26/19 0259  NA 135 138 138 140 143  K 3.9 3.2* 3.8 3.9 4.3  CL 92* 99  --   --  102  CO2 32 31  --   --  33*  GLUCOSE 121* 83  --    --  119*  BUN <5* <5*  --   --  5*  CREATININE 0.85 0.71  --   --  0.68  CALCIUM 8.6* 8.0*  --   --  8.2*   LFT Recent Labs    12/26/19 0259  PROT 5.2*  ALBUMIN 2.5*  AST 17  ALT 10  ALKPHOS 68  BILITOT 0.8   PT/INR No results for input(s): LABPROT, INR in the last 72 hours. Hepatitis Panel No results for input(s): HEPBSAG, HCVAB, HEPAIGM, HEPBIGM in the last 72 hours.  MR 3D Recon At Scanner  Result Date: 12/24/2019 CLINICAL DATA:  Gallstones with biliary dilatation. EXAM: MRI ABDOMEN WITHOUT AND WITH CONTRAST (INCLUDING MRCP) TECHNIQUE: Multiplanar multisequence MR imaging of the abdomen was performed both before and after the administration of intravenous contrast. Heavily T2-weighted images of the biliary and pancreatic ducts were obtained, and three-dimensional MRCP images were rendered by post processing. CONTRAST:  50mL GADAVIST GADOBUTROL 1 MMOL/ML IV SOLN COMPARISON:  Abdomen/pelvis CT 12/23/2019. Abdominal ultrasound 12/23/2019. FINDINGS: Lower chest: Dependent atelectasis with tiny bilateral effusions. Hepatobiliary: Tiny cyst noted in the anterior left liver. No suspicious enhancing liver abnormality. Gallbladder is distended with small volume pericholecystic fluid. Several tiny/granular stones are visualized in the gallbladder lumen (image 12 coronal T2 series  12). Common bile duct in the head of pancreas measures up to 8-9 mm diameter with some dependent sludge and probable tiny dependent stones (coronal T2 image 9 of series 12). Common bile duct inserts low on the descending duodenum. Pancreas: Pancreas divisum ductal anatomy. No evidence for pancreatic mass lesion although assessment on postcontrast imaging is motion degraded. Spleen:  No splenomegaly. No focal mass lesion. Adrenals/Urinary Tract: No adrenal nodule or mass. Tiny T2 hyperintense lesions in the cortex of both kidney show no appreciable enhancement after IV contrast administration, but assessment on postcontrast  imaging is markedly motion degraded. Stomach/Bowel: Stomach is unremarkable. No gastric wall thickening. No evidence of outlet obstruction. Duodenum is normally positioned as is the ligament of Treitz. No small bowel or colonic dilatation within the visualized abdomen. Wall thickening in the transverse colon described on the recent CT scan is not well demonstrated by MR. Vascular/Lymphatic: No abdominal aortic aneurysm. No abdominal lymphadenopathy. Other: Trace perihepatic fluid with trace fluid in the gallbladder fossa. Body wall edema noted. Musculoskeletal: Status post lumbosacral fusion. No suspicious marrow enhancement within the visualized bony anatomy. IMPRESSION: 1. Cholelithiasis with small volume pericholecystic fluid. Mild intra and extrahepatic biliary duct dilatation is associated with several apparent tiny granular stones in the common bile duct. 2. Pancreas divisum anatomy with major papilla low on the descending duodenum. 3. Probable tiny bilateral renal cysts incompletely characterized due to substantial motion degradation on postcontrast imaging today. Electronically Signed   By: Misty Stanley M.D.   On: 12/24/2019 10:43   DG CHEST PORT 1 VIEW  Result Date: 12/25/2019 CLINICAL DATA:  Acute respiratory failure, post ERCP, BIPAP EXAM: PORTABLE CHEST 1 VIEW COMPARISON:  CT chest 12/23/2019, chest radiograph 12/23/2019 FINDINGS: Heart size at the upper limits of normal, unchanged. Aortic atherosclerosis. Significantly increased from prior examinations, there is bibasilar opacity consistent with atelectasis and/or pneumonia with possible small effusions. No evidence of pneumothorax. Upper thoracic levocurvature. Overlying cardiac monitoring leads. IMPRESSION: Increasing bibasilar opacities consistent with atelectasis and/or pneumonia with possible small effusions. Electronically Signed   By: Kellie Simmering DO   On: 12/25/2019 11:10   DG ERCP sphincterotomy  Result Date: 12/25/2019 CLINICAL  DATA:  ERCP for choledocholithiasis. EXAM: ERCP TECHNIQUE: Multiple spot images obtained with the fluoroscopic device and submitted for interpretation post-procedure. FLUOROSCOPY TIME:  5 minutes, 39 seconds COMPARISON:  MRCP-12/24/2019 FINDINGS: Eight spot intraoperative fluoroscopic images of the right upper abdominal quadrant during ERCP are provided for review Initial image demonstrates an ERCP probe overlying the right upper abdominal quadrant with selective cannulation opacification of the common bile duct which appears mildly dilated. Subsequent images demonstrate insufflation of a balloon within the mid aspect of the CBD with subsequent biliary sweeping and presumed sphincterotomy. There is passage of contrast through the cystic duct with opacification of the gallbladder. There is minimal opacification of the intrahepatic biliary tree which appears mildly dilated. IMPRESSION: ERCP with biliary sweeping and presumed sphincterotomy as above. These images were submitted for radiologic interpretation only. Please see the procedural report for the amount of contrast and the fluoroscopy time utilized. Electronically Signed   By: Sandi Mariscal M.D.   On: 12/25/2019 10:25   MR ABDOMEN MRCP W WO CONTAST  Result Date: 12/24/2019 CLINICAL DATA:  Gallstones with biliary dilatation. EXAM: MRI ABDOMEN WITHOUT AND WITH CONTRAST (INCLUDING MRCP) TECHNIQUE: Multiplanar multisequence MR imaging of the abdomen was performed both before and after the administration of intravenous contrast. Heavily T2-weighted images of the biliary and pancreatic ducts were obtained,  and three-dimensional MRCP images were rendered by post processing. CONTRAST:  45mL GADAVIST GADOBUTROL 1 MMOL/ML IV SOLN COMPARISON:  Abdomen/pelvis CT 12/23/2019. Abdominal ultrasound 12/23/2019. FINDINGS: Lower chest: Dependent atelectasis with tiny bilateral effusions. Hepatobiliary: Tiny cyst noted in the anterior left liver. No suspicious enhancing liver  abnormality. Gallbladder is distended with small volume pericholecystic fluid. Several tiny/granular stones are visualized in the gallbladder lumen (image 12 coronal T2 series 12). Common bile duct in the head of pancreas measures up to 8-9 mm diameter with some dependent sludge and probable tiny dependent stones (coronal T2 image 9 of series 12). Common bile duct inserts low on the descending duodenum. Pancreas: Pancreas divisum ductal anatomy. No evidence for pancreatic mass lesion although assessment on postcontrast imaging is motion degraded. Spleen:  No splenomegaly. No focal mass lesion. Adrenals/Urinary Tract: No adrenal nodule or mass. Tiny T2 hyperintense lesions in the cortex of both kidney show no appreciable enhancement after IV contrast administration, but assessment on postcontrast imaging is markedly motion degraded. Stomach/Bowel: Stomach is unremarkable. No gastric wall thickening. No evidence of outlet obstruction. Duodenum is normally positioned as is the ligament of Treitz. No small bowel or colonic dilatation within the visualized abdomen. Wall thickening in the transverse colon described on the recent CT scan is not well demonstrated by MR. Vascular/Lymphatic: No abdominal aortic aneurysm. No abdominal lymphadenopathy. Other: Trace perihepatic fluid with trace fluid in the gallbladder fossa. Body wall edema noted. Musculoskeletal: Status post lumbosacral fusion. No suspicious marrow enhancement within the visualized bony anatomy. IMPRESSION: 1. Cholelithiasis with small volume pericholecystic fluid. Mild intra and extrahepatic biliary duct dilatation is associated with several apparent tiny granular stones in the common bile duct. 2. Pancreas divisum anatomy with major papilla low on the descending duodenum. 3. Probable tiny bilateral renal cysts incompletely characterized due to substantial motion degradation on postcontrast imaging today. Electronically Signed   By: Misty Stanley M.D.   On:  12/24/2019 10:43     Principal Problem:   Nausea & vomiting Active Problems:   Chronic pain syndrome   Common bile duct stone   Acute cholecystitis   Abnormal MRI of abdomen   Cholelithiasis     LOS: 3 days   Denise King ,NP 12/26/2019, 9:14 AM    Attending physician's note   I have taken an interval history, reviewed the chart and examined the patient. I agree with the Advanced Practitioner's note, impression and recommendations.   68 year old female with history of cholecystitis s/p ERCP yesterday with no CBD stones. She is off BiPAP. Currently on Unasyn No abdominal pain s/p ERCP.  Can slowly advance diet as tolerated Timing for cholecystectomy per surgical team We will sign off, available if have any questions or concerns  K. Denzil Magnuson , MD [phone]

## 2019-12-26 NOTE — Progress Notes (Signed)
1 Day Post-Op    CC: Nausea/vomiting and abdominal pain  Subjective: Patient is comfortable on nasal cannula this AM.  She still seems a little bit confused.  She has no abdominal pain this a.m.  Objective: Vital signs in last 24 hours: Temp:  [97 F (36.1 C)-98.7 F (37.1 C)] 98.2 F (36.8 C) (12/29 0313) Pulse Rate:  [59-117] 74 (12/29 0300) Resp:  [9-69] 27 (12/29 0300) BP: (106-187)/(50-95) 120/80 (12/29 0300) SpO2:  [75 %-100 %] 100 % (12/29 0300) FiO2 (%):  [50 %-100 %] 50 % (12/28 1400) Last BM Date: 12/25/19 5160 IV 500 urine BM x1 Afebrile vital signs are stable respiratory rate between 50 - 22 pH 7.13>> 7.35 yesterday. O2 sats 93 to 100% on 4 L nasal cannula currently Lipase 24 LFTs are normal WBC 7.8  H/H 14.7/48.2   Intake/Output from previous day: 12/28 0701 - 12/29 0700 In: 5160.1 [I.V.:3625.2; IV Piggyback:1534.9] Out: 500 [Urine:500] Intake/Output this shift: No intake/output data recorded.  General appearance: alert, cooperative and no distress Resp: clear to auscultation bilaterally and Comfortable on nasal cannula. GI: soft, non-tender; bowel sounds normal; no masses,  no organomegaly  Lab Results:  Recent Labs    12/24/19 0500 12/25/19 1553 12/26/19 0259  WBC 6.0  --  7.8  HGB 14.4 16.7* 14.7  HCT 45.6 49.0* 48.2*  PLT 153  --  134*    BMET Recent Labs    12/24/19 0500 12/25/19 1553 12/26/19 0259  NA 138 140 143  K 3.2* 3.9 4.3  CL 99  --  102  CO2 31  --  33*  GLUCOSE 83  --  119*  BUN <5*  --  5*  CREATININE 0.71  --  0.68  CALCIUM 8.0*  --  8.2*   PT/INR No results for input(s): LABPROT, INR in the last 72 hours.  Recent Labs  Lab 12/23/19 0948 12/24/19 0500 12/26/19 0259  AST 36 20 17  ALT 12 9 10   ALKPHOS 97 80 68  BILITOT 0.5 0.9 0.8  PROT 5.8* 5.0* 5.2*  ALBUMIN 3.3* 2.6* 2.5*     Lipase     Component Value Date/Time   LIPASE 24 12/26/2019 0259     Prior to Admission medications   Medication Sig  Start Date End Date Taking? Authorizing Provider  ARIPiprazole (ABILIFY) 2 MG tablet Take 1 tablet (2 mg total) by mouth daily. Patient taking differently: Take 2 mg by mouth at bedtime.  12/19/19 02/17/20 Yes Pucilowski, Marchia Bond, MD  aspirin 81 MG chewable tablet Chew 1 tablet (81 mg total) by mouth daily. 04/12/19  Yes Sheikh, Omair Latif, DO  busPIRone (BUSPAR) 10 MG tablet Take 1 tablet (10 mg total) by mouth 3 (three) times daily. 12/19/19  Yes Pucilowski, Olgierd A, MD  Cholecalciferol (VITAMIN D) 50 MCG (2000 UT) tablet Take 2,000 Units by mouth daily after lunch.   Yes [provider]  cholestyramine (QUESTRAN) 4 GM/DOSE powder Take 1 packet (4 g total) by mouth 2 (two) times daily with a meal. Patient taking differently: Take 4 g by mouth 2 (two) times daily as needed ([name] (take with meals)).  03/24/19  Yes Gatha Mayer, MD  denosumab (PROLIA) 60 MG/ML SOSY injection Inject 60 mg into the skin every 6 (six) months.   Yes [provider]  diphenhydramine-acetaminophen (TYLENOL PM) 25-500 MG TABS tablet Take 2 tablets by mouth at bedtime as needed (sleep).   Yes [provider]  doxylamine, Sleep, (UNISOM) 25 MG  tablet Take 25 mg by mouth at bedtime.    Yes [provider]  folic acid (FOLVITE) 1 MG tablet Take 1 tablet (1 mg total) by mouth daily. Patient taking differently: Take 1 mg by mouth daily after lunch.  09/05/18  Yes Biagio Borg, MD  gabapentin (NEURONTIN) 300 MG capsule Take 300 mg by mouth 3 (three) times daily.   Yes [provider]  hydrOXYzine (VISTARIL) 25 MG capsule Take 1 capsule (25 mg total) by mouth every 8 (eight) hours as needed for anxiety. 12/19/19 03/18/20 Yes Pucilowski, Olgierd A, MD  hyoscyamine (ANASPAZ) 0.125 MG TBDP disintergrating tablet Place 0.125 mg under the tongue every 6 (six) hours as needed ([name] cramps).    Yes [provider]  morphine (MS CONTIN) 60 MG 12 hr tablet Take 60 mg by mouth every 12  (twelve) hours. 05/02/19  Yes [provider]  Oxycodone HCl 10 MG TABS Take 10 mg by mouth 4 (four) times daily.  03/06/19  Yes [provider]  pantoprazole (PROTONIX) 40 MG tablet Take 1 tablet (40 mg total) by mouth daily. Patient taking differently: Take 40 mg by mouth at bedtime.  04/12/19  Yes Sheikh, McClure, DO  rosuvastatin (CRESTOR) 5 MG tablet Take 1 tablet (5 mg total) by mouth every Monday, Wednesday, and Friday at 6 PM. 08/30/19 01/28/20 Yes Josue Hector, MD  sertraline (ZOLOFT) 100 MG tablet Take 2 tablets (200 mg total) by mouth at bedtime. 12/19/19 03/18/20 Yes Pucilowski, Olgierd A, MD  triamcinolone cream (KENALOG) 0.1 % Apply 1 application topically 2 (two) times daily as needed (itching).  03/14/19  Yes [provider]  valACYclovir (VALTREX) 500 MG tablet Take 500 mg by mouth 2 (two) times daily as needed (flare ups).   Yes [provider]  vitamin B-12 (CYANOCOBALAMIN) 100 MCG tablet Take 1 tablet (100 mcg total) by mouth daily. Patient taking differently: Take 100 mcg by mouth daily after lunch.  11/03/18  Yes Biagio Borg, MD    Medications: . ARIPiprazole  2 mg Oral QHS  . busPIRone  10 mg Oral TID  . chlorhexidine  15 mL Mouth Rinse BID  . Chlorhexidine Gluconate Cloth  6 each Topical Daily  . cholecalciferol  2,000 Units Oral QPC lunch  . enoxaparin (LOVENOX) injection  40 mg Subcutaneous QHS  . folic acid  1 mg Oral QPC lunch  . gabapentin  300 mg Oral TID  . indomethacin  100 mg Rectal Once  . mouth rinse  15 mL Mouth Rinse q12n4p  . morphine  60 mg Oral Q12H  . mupirocin ointment  1 application Nasal BID  . pantoprazole  40 mg Oral QHS  . potassium chloride  40 mEq Oral Once  . rosuvastatin  5 mg Oral Q M,W,F-1800  . sertraline  200 mg Oral QHS  . vitamin B-12  100 mcg Oral QPC lunch    Assessment/Plan Acute hypercapnic respiratory failure/encephalopathy -resolving  Chronic pain syndrome -MS Contin/Neurontin at  home Hypertension Hx alcohol dependence ?  Deconditioned   41F acute cholecystitis, choledocholithiasis - s/p ERCP 12/28, no common bile duct stones were found and sphincterotomy performed - transferred to ICU post-procedure for acute respiratory failure, CCM following  -Respiratory failure improved/transfer to floor later today -LFT's stable/lipase 24  ID - unasyn 12/26>> day 4 FEN - IVF, NPO VTE - SCDs, lovenox Foley - none Follow up - TBD  Plan: Patient's exam shows she is nontender.  LFTs and WBC  are stable.  Would recommend placing her on clear liquids for now continue antibiotics.  We will continue to follow.  When she is in better condition will discuss laparoscopic cholecystectomy.       LOS: 3 days    Louvina Cleary 12/26/2019 Please see Amion

## 2019-12-27 DIAGNOSIS — G9341 Metabolic encephalopathy: Secondary | ICD-10-CM

## 2019-12-27 LAB — CBC
HCT: 49.2 % — ABNORMAL HIGH (ref 36.0–46.0)
Hemoglobin: 15 g/dL (ref 12.0–15.0)
MCH: 32.4 pg (ref 26.0–34.0)
MCHC: 30.5 g/dL (ref 30.0–36.0)
MCV: 106.3 fL — ABNORMAL HIGH (ref 80.0–100.0)
Platelets: 104 10*3/uL — ABNORMAL LOW (ref 150–400)
RBC: 4.63 MIL/uL (ref 3.87–5.11)
RDW: 13.2 % (ref 11.5–15.5)
WBC: 6.3 10*3/uL (ref 4.0–10.5)
nRBC: 0 % (ref 0.0–0.2)

## 2019-12-27 LAB — POCT I-STAT 7, (LYTES, BLD GAS, ICA,H+H)
Acid-Base Excess: 9 mmol/L — ABNORMAL HIGH (ref 0.0–2.0)
Bicarbonate: 35.4 mmol/L — ABNORMAL HIGH (ref 20.0–28.0)
Calcium, Ion: 1.15 mmol/L (ref 1.15–1.40)
HCT: 47 % — ABNORMAL HIGH (ref 36.0–46.0)
Hemoglobin: 16 g/dL — ABNORMAL HIGH (ref 12.0–15.0)
O2 Saturation: 98 %
Potassium: 4.4 mmol/L (ref 3.5–5.1)
Sodium: 139 mmol/L (ref 135–145)
TCO2: 37 mmol/L — ABNORMAL HIGH (ref 22–32)
pCO2 arterial: 53.1 mmHg — ABNORMAL HIGH (ref 32.0–48.0)
pH, Arterial: 7.432 (ref 7.350–7.450)
pO2, Arterial: 109 mmHg — ABNORMAL HIGH (ref 83.0–108.0)

## 2019-12-27 LAB — COMPREHENSIVE METABOLIC PANEL
ALT: 10 U/L (ref 0–44)
AST: 33 U/L (ref 15–41)
Albumin: 2.7 g/dL — ABNORMAL LOW (ref 3.5–5.0)
Alkaline Phosphatase: 69 U/L (ref 38–126)
Anion gap: 12 (ref 5–15)
BUN: 7 mg/dL — ABNORMAL LOW (ref 8–23)
CO2: 29 mmol/L (ref 22–32)
Calcium: 8.5 mg/dL — ABNORMAL LOW (ref 8.9–10.3)
Chloride: 99 mmol/L (ref 98–111)
Creatinine, Ser: 0.67 mg/dL (ref 0.44–1.00)
GFR calc Af Amer: >60 mL/min (ref >60)
GFR calc non Af Amer: >60 mL/min (ref >60)
Glucose, Bld: 85 mg/dL (ref 70–99)
Potassium: 5.7 mmol/L — ABNORMAL HIGH (ref 3.5–5.1)
Sodium: 140 mmol/L (ref 135–145)
Total Bilirubin: 1.7 mg/dL — ABNORMAL HIGH (ref 0.3–1.2)
Total Protein: 5.5 g/dL — ABNORMAL LOW (ref 6.5–8.1)

## 2019-12-27 LAB — MAGNESIUM: Magnesium: 2 mg/dL (ref 1.7–2.4)

## 2019-12-27 MED ORDER — FUROSEMIDE 10 MG/ML IJ SOLN
60.0000 mg | Freq: Once | INTRAMUSCULAR | Status: AC
  Administered 2019-12-27: 12:00:00 60 mg via INTRAVENOUS
  Filled 2019-12-27: qty 6

## 2019-12-27 NOTE — Progress Notes (Signed)
Patient ID: Denise King, female   DOB: 1951-11-07, 68 y.o.   MRN: [id number]  PROGRESS NOTE    Denise King  [id number] DOB: 1951-06-08 DOA: 12/23/2019 PCP: Biagio Borg, MD   Brief Narrative:  68 year old female with history of irritable bowel syndrome, chronic pain syndrome, hypertension, GERD, alcohol abuse, diverticulosis, anxiety and anemia was admitted with acute calculus cholecystitis and started on IV antibiotics.  MRI of the abdomen revealed choledocholithiasis.  General surgery and GI were consulted.  She underwent ERCP on 12/25/2019.  Subsequently, she became hypoxic and was transferred to ICU on BiPAP.  PCCM was consulted.  Assessment & Plan:   Principal Problem:   Nausea & vomiting Active Problems:   Chronic pain syndrome   Abdominal pain   Common bile duct stone   Acute cholecystitis   Abnormal MRI of abdomen   Cholelithiasis  Acute hypercapnic respiratory failure Acute metabolic encephalopathy -Patient became hypercapnic and encephalopathic after ERCP and required BiPAP and transferred to ICU.  PCCM was consulted but subsequently signed off -Mental status has much improved and is probably back to baseline -Currently off BiPAP and on 4 L oxygen via nasal cannula. -Positive balance of 7533.2 cc since admission.  Unclear if this is accurate representation of her intake and output.  We will give 1 dose of IV Lasix as well. -Incentive spirometry. -Monitor mental status.  Acute calculus cholecystitis with concern for choledocholithiasis -MRI of the abdomen showed common bile duct stones.  ERCP on 12/25/2019 showed no CBD stones -Continue IV Unasyn.  General surgery following and deciding timing of surgery  Hyperkalemia -Potassium 5.7.  Will repeat potassium level this morning.  Thrombocytopenia -Questionable cause.  Monitor.  No signs of bleeding  Chronic pain syndrome -Continue morphine.  We will have to be cautious with pain medication to avoid  excessive somnolence.  Outpatient follow-up  Hypertension -Blood pressure intermittently on the higher side.  Continue labetalol as needed  Generalized deconditioning -PT eval   DVT prophylaxis: Lovenox Code Status: Full Family Communication: Spoke to patient at bedside Disposition Plan: Depends on clinical outcome  Consultants: GI/general surgery/PCCM  Procedures: ERCP on 12/25/2019  Antimicrobials:  Anti-infectives (From admission, onward)   Start     Dose/Rate Route Frequency Ordered Stop   12/25/19 0930  Ampicillin-Sulbactam (UNASYN) 3 g in sodium chloride 0.9 % 100 mL IVPB     3 g 200 mL/hr over 30 Minutes Intravenous  Once 12/25/19 0928 12/25/19 0958   12/23/19 2115  Ampicillin-Sulbactam (UNASYN) 3 g in sodium chloride 0.9 % 100 mL IVPB     3 g 200 mL/hr over 30 Minutes Intravenous Every 6 hours 12/23/19 2100     12/23/19 2103  valACYclovir (VALTREX) tablet 500 mg     500 mg Oral 2 times daily PRN 12/23/19 2104         Subjective: Patient seen and examined at bedside sitting on chair.  Denies worsening abdominal pain, nausea or vomiting.  Poor historian.  Objective: Vitals:   12/27/19 0600 12/27/19 0737 12/27/19 0800 12/27/19 0900  BP: (!) 148/85 132/61 (!) 144/63 134/75  Pulse: 76 71 73 (!) 57  Resp: 18 (!) 23 (!) 30 14  Temp:  98.8 F (37.1 C) 98.8 F (37.1 C)   TempSrc:  Oral Oral   SpO2: 99% 98% 99% 98%  Weight:      Height:        Intake/Output Summary (Last 24 hours) at 12/27/2019 [id number] Last data filed at 12/27/2019 0400  Gross per 24 hour  Intake 2160.18 ml  Output 1 ml  Net 2159.18 ml   Filed Weights   12/23/19 1225 12/27/19 0500  Weight: 48.5 kg 59.2 kg    Examination:  General exam: Appears calm and comfortable.  Looks older than stated age.  Looks chronically ill.  Poor historian. Respiratory system: Bilateral decreased breath sounds at bases with some scattered crackles.  Intermittently tachypneic Cardiovascular system: S1 & S2 heard,  Rate controlled Gastrointestinal system: Abdomen is nondistended, soft and nontender. Normal bowel sounds heard. Extremities: No cyanosis, clubbing; trace lower extremity edema Central nervous system: Alert and oriented. No focal neurological deficits. Moving extremities Skin: No rashes, lesions or ulcers Psychiatry: Looks slightly anxious     Data Reviewed: I have personally reviewed following labs and imaging studies  CBC: Recent Labs  Lab 12/23/19 0948 12/24/19 0500 12/25/19 1553 12/26/19 0259 12/26/19 1920 12/27/19 0404 12/27/19 0426  WBC 6.4 6.0  --  7.8  --  6.3  --   HGB 15.8* 14.4 16.7* 14.7 17.3* 15.0 16.0*  HCT 49.9* 45.6 49.0* 48.2* 51.0* 49.2* 47.0*  MCV 101.6* 102.5*  --  104.8*  --  106.3*  --   PLT 181 153  --  134*  --  104*  --    Basic Metabolic Panel: Recent Labs  Lab 12/23/19 0948 12/24/19 0500 12/25/19 1534 12/25/19 1553 12/26/19 0259 12/26/19 1920 12/27/19 0426 12/27/19 0552  NA 135 138  --  140 143 138 139 140  K 3.9 3.2*  --  3.9 4.3 4.2 4.4 5.7*  CL 92* 99  --   --  102  --   --  99  CO2 32 31  --   --  33*  --   --  29  GLUCOSE 121* 83  --   --  119*  --   --  85  BUN <5* <5*  --   --  5*  --   --  7*  CREATININE 0.85 0.71  --   --  0.68  --   --  0.67  CALCIUM 8.6* 8.0*  --   --  8.2*  --   --  8.5*  MG  --   --  1.5*  --   --   --   --  2.0   GFR: Estimated Creatinine Clearance: 57.2 mL/min (by C-G formula based on SCr of 0.67 mg/dL). Liver Function Tests: Recent Labs  Lab 12/23/19 0948 12/24/19 0500 12/26/19 0259 12/27/19 0552  AST 36 20 17 33  ALT 12 9 10 10   ALKPHOS 97 80 68 69  BILITOT 0.5 0.9 0.8 1.7*  PROT 5.8* 5.0* 5.2* 5.5*  ALBUMIN 3.3* 2.6* 2.5* 2.7*   Recent Labs  Lab 12/23/19 0948 12/26/19 0259  LIPASE 20 24   No results for input(s): AMMONIA in the last 168 hours. Coagulation Profile: No results for input(s): INR, PROTIME in the last 168 hours. Cardiac Enzymes: No results for input(s): CKTOTAL, CKMB,  CKMBINDEX, TROPONINI in the last 168 hours. BNP (last 3 results) No results for input(s): PROBNP in the last 8760 hours. HbA1C: No results for input(s): HGBA1C in the last 72 hours. CBG: Recent Labs  Lab 12/26/19 1854  GLUCAP 104*   Lipid Profile: No results for input(s): CHOL, HDL, LDLCALC, TRIG, CHOLHDL, LDLDIRECT in the last 72 hours. Thyroid Function Tests: No results for input(s): TSH, T4TOTAL, FREET4, T3FREE, THYROIDAB in the last 72 hours. Anemia Panel: No results for input(s): VITAMINB12,  FOLATE, FERRITIN, TIBC, IRON, RETICCTPCT in the last 72 hours. Sepsis Labs: Recent Labs  Lab 12/25/19 1534  LATICACIDVEN 1.4    Recent Results (from the past 240 hour(s))  SARS CORONAVIRUS 2 (TAT 6-24 HRS) Nasopharyngeal Nasopharyngeal Swab     Status: None   Collection Time: 12/23/19  3:21 PM   Specimen: Nasopharyngeal Swab  Result Value Ref Range Status   SARS Coronavirus 2 NEGATIVE NEGATIVE Final    Comment: (NOTE) SARS-CoV-2 target nucleic acids are NOT DETECTED. The SARS-CoV-2 RNA is generally detectable in upper and lower respiratory specimens during the acute phase of infection. Negative results do not preclude SARS-CoV-2 infection, do not rule out co-infections with other pathogens, and should not be used as the sole basis for treatment or other patient management decisions. Negative results must be combined with clinical observations, patient history, and epidemiological information. The expected result is Negative. Fact Sheet for Patients: SugarRoll.be Fact Sheet for Healthcare Providers: https://www.woods-mathews.com/ This test is not yet approved or cleared by the [place] FDA and  has been authorized for detection and/or diagnosis of SARS-CoV-2 by FDA under an Emergency Use Authorization (EUA). This EUA will remain  in effect (meaning this test can be used) for the duration of the COVID-19 declaration under Section 56  4(b)(1) of the Act, 21 U.S.C. section 360bbb-3(b)(1), unless the authorization is terminated or revoked sooner. Performed at [facility], [place] [address], [place], [place] [address]   Surgical PCR screen     Status: None   Collection Time: 12/25/19  2:12 PM   Specimen: Nasal Mucosa; Nasal Swab  Result Value Ref Range Status   MRSA, PCR NEGATIVE NEGATIVE Final   Staphylococcus aureus NEGATIVE NEGATIVE Final    Comment: (NOTE) The Xpert SA Assay (FDA approved for NASAL specimens in patients 79 years of age and older), is one component of a comprehensive surveillance program. It is not intended to diagnose infection nor to guide or monitor treatment. Performed at [facility], [place] [address], [place], [place] [address]   MRSA PCR Screening     Status: None   Collection Time: 12/25/19  2:12 PM   Specimen: Nasal Mucosa; Nasopharyngeal  Result Value Ref Range Status   MRSA by PCR NEGATIVE NEGATIVE Final    Comment:        The GeneXpert MRSA Assay (FDA approved for NASAL specimens only), is one component of a comprehensive MRSA colonization surveillance program. It is not intended to diagnose MRSA infection nor to guide or monitor treatment for MRSA infections. Performed at [facility], [place] [address] [place], [place] [address]   Culture, blood (routine x 2)     Status: None (Preliminary result)   Collection Time: 12/25/19  3:26 PM   Specimen: BLOOD RIGHT HAND  Result Value Ref Range Status   Specimen Description BLOOD RIGHT HAND  Final   Special Requests   Final    BOTTLES DRAWN AEROBIC AND ANAEROBIC Blood Culture results may not be optimal due to an inadequate volume of blood received in culture bottles   Culture   Final    NO GROWTH < 24 HOURS Performed at [facility], [place] [address], [place], [place] [address]    Report Status PENDING  Incomplete  Culture, blood (routine x 2)     Status: None (Preliminary result)   Collection Time: 12/25/19   3:34 PM   Specimen: BLOOD LEFT ARM  Result Value Ref Range Status  Specimen Description BLOOD LEFT ARM LOWER  Final   Special Requests   Final    BOTTLES DRAWN AEROBIC AND ANAEROBIC Blood Culture results may not be optimal due to an inadequate volume of blood received in culture bottles   Culture   Final    NO GROWTH < 24 HOURS Performed at [facility] [address] [place], [place] [address]    Report Status PENDING  Incomplete         Radiology Studies: DG CHEST PORT 1 VIEW  Result Date: 12/25/2019 CLINICAL DATA:  Acute respiratory failure, post ERCP, BIPAP EXAM: PORTABLE CHEST 1 VIEW COMPARISON:  CT chest 12/23/2019, chest radiograph 12/23/2019 FINDINGS: Heart size at the upper limits of normal, unchanged. Aortic atherosclerosis. Significantly increased from prior examinations, there is bibasilar opacity consistent with atelectasis and/or pneumonia with possible small effusions. No evidence of pneumothorax. Upper thoracic levocurvature. Overlying cardiac monitoring leads. IMPRESSION: Increasing bibasilar opacities consistent with atelectasis and/or pneumonia with possible small effusions. Electronically Signed   By: Kellie Simmering DO   On: 12/25/2019 11:10   DG ERCP sphincterotomy  Result Date: 12/25/2019 CLINICAL DATA:  ERCP for choledocholithiasis. EXAM: ERCP TECHNIQUE: Multiple spot images obtained with the fluoroscopic device and submitted for interpretation post-procedure. FLUOROSCOPY TIME:  5 minutes, 39 seconds COMPARISON:  MRCP-12/24/2019 FINDINGS: Eight spot intraoperative fluoroscopic images of the right upper abdominal quadrant during ERCP are provided for review Initial image demonstrates an ERCP probe overlying the right upper abdominal quadrant with selective cannulation opacification of the common bile duct which appears mildly dilated. Subsequent images demonstrate insufflation of a balloon within the mid aspect of the CBD with subsequent biliary sweeping and  presumed sphincterotomy. There is passage of contrast through the cystic duct with opacification of the gallbladder. There is minimal opacification of the intrahepatic biliary tree which appears mildly dilated. IMPRESSION: ERCP with biliary sweeping and presumed sphincterotomy as above. These images were submitted for radiologic interpretation only. Please see the procedural report for the amount of contrast and the fluoroscopy time utilized. Electronically Signed   By: Sandi Mariscal M.D.   On: 12/25/2019 10:25        Scheduled Meds: . ARIPiprazole  2 mg Oral QHS  . busPIRone  10 mg Oral TID  . chlorhexidine  15 mL Mouth Rinse BID  . Chlorhexidine Gluconate Cloth  6 each Topical Daily  . cholecalciferol  2,000 Units Oral QPC lunch  . enoxaparin (LOVENOX) injection  40 mg Subcutaneous QHS  . folic acid  1 mg Oral QPC lunch  . gabapentin  300 mg Oral TID  . indomethacin  100 mg Rectal Once  . mouth rinse  15 mL Mouth Rinse q12n4p  . morphine  60 mg Oral Q12H  . mupirocin ointment  1 application Nasal BID  . pantoprazole  40 mg Oral QHS  . potassium chloride  40 mEq Oral Once  . rosuvastatin  5 mg Oral Q M,W,F-1800  . sertraline  200 mg Oral QHS  . vitamin B-12  100 mcg Oral QPC lunch   Continuous Infusions: . ampicillin-sulbactam (UNASYN) IV 3 g (12/27/19 0912)          Aline August, MD [facility] 12/27/2019, 9:27 AM

## 2019-12-27 NOTE — TOC Initial Note (Signed)
Transition of Care [facility]) - Initial/Assessment Note    Patient Details  Name: Denise King MRN: [id number] Date of Birth: 08/24/1951  Transition of Care [facility]) CM/SW Contact:    Marilu Favre, RN Phone Number: 12/27/2019, 2:38 PM  Clinical Narrative:                  Confirmed face sheet information with patient at bedside. Patient has a walker  At home and does not want a 3 in 1 .   Patient lives with husband who can provide supervision.   Provided choice for home health PT. Patient has had [facility] in the past , if they are unable to accept , she has no preference.   Spoke to R[place] with [facility] they are unable to accept due to insurance.   C[facility] with [facility] has accepted referral for HHPT. Patient aware.   Patient currently on oxygen , however does not have oxygen at home. WIll continue to follow. Will need ambulation pulse oxygenation prior to discharge. Expected Discharge Plan: [place] Barriers to Discharge: Continued Medical Work up   Patient Goals and CMS Choice Patient states their goals for this hospitalization and ongoing recovery are:: to go home CMS Medicare.gov Compare Post Acute Care list provided to:: Patient Choice offered to / list presented to : Patient  Expected Discharge Plan and Services Expected Discharge Plan: [place]   Discharge Planning Services: CM Consult Post Acute Care Choice: [place] arrangements for the past 2 months: Single Family Home                 DME Arranged: N/A         HH Arranged: PT HH Agency: [facility] Date [place]: 12/27/19 Time HH Agency Contacted: [phone] Representative spoke with at [place]: Tommi Rumps  Prior Living Arrangements/Services Living arrangements for the past 2 months: [place] Lives with:: Spouse Patient language and need for interpreter reviewed:: Yes Do you feel safe going back to the place where you  live?: Yes      Need for Family Participation in Patient Care: Yes (Comment) Care giver support system in place?: Yes (comment) Current home services: DME Criminal Activity/Legal Involvement Pertinent to Current Situation/Hospitalization: No - Comment as needed  Activities of Daily Living Home Assistive Devices/Equipment: None ADL Screening (condition at time of admission) Patient's cognitive ability adequate to safely complete daily activities?: No Is the patient deaf or have difficulty hearing?: No Does the patient have difficulty seeing, even when wearing glasses/contacts?: No Does the patient have difficulty concentrating, remembering, or making decisions?: No Patient able to express need for assistance with ADLs?: Yes Does the patient have difficulty dressing or bathing?: No Independently performs ADLs?: Yes (appropriate for developmental age) Does the patient have difficulty walking or climbing stairs?: No Weakness of Legs: None Weakness of Arms/Hands: None  Permission Sought/Granted   Permission granted to share information with : Yes, Verbal Permission Granted     Permission granted to share info w AGENCY: [facility]        Emotional Assessment Appearance:: Appears stated age Attitude/Demeanor/Rapport: Engaged Affect (typically observed): Accepting Orientation: : Oriented to Self, Oriented to Place, Oriented to  Time, Oriented to Situation Alcohol / Substance Use: Not Applicable Psych Involvement: No (comment)  Admission diagnosis:  Common bile duct stone [K80.50] Abdominal pain [R10.9] Patient Active Problem List   Diagnosis Date Noted  . Abnormal MRI  of abdomen   . Cholelithiasis   . Common bile duct stone 12/23/2019  . Acute cholecystitis 12/23/2019  . Polycythemia 09/11/2019  . Right knee pain 08/09/2019  . Quadriceps contusion 08/09/2019  . Abnormal cardiac CT angiography 05/11/2019  . Chest pain 04/11/2019  . Acute viral syndrome 04/11/2019  . Tobacco abuse  04/11/2019  . Hyperphosphatemia 04/11/2019  . SOB (shortness of breath) 04/07/2019  . Hip fracture ([place]) 10/01/2018  . Dysuria 05/20/2018  . Right leg swelling 05/20/2018  . CAP (community acquired pneumonia) 05/13/2018  . Tachycardia 05/13/2018  . Chest wall pain 05/13/2018  . Narcotic poisoning ([place]) 05/13/2018  . EtOH dependence ([place])   . Polypharmacy   . Esophagitis   . Preventative health care 12/22/2017  . Erythrocytosis 12/22/2017  . Depression 12/22/2017  . Hyperglycemia 12/22/2017  . Right sided sciatica 12/22/2017  . Nausea & vomiting 07/17/2017  . Cough 06/10/2017  . Diarrhea 04/22/2016  . Family history of colon cancer - brother and father 03/10/2016  . Insomnia 02/11/2016  . Generalized anxiety disorder 11/25/2015  . Urinary frequency 04/25/2015  . Nausea and vomiting in adult 11/08/2014  . Cigarette smoker 08/15/2013  . Otitis media 10/27/2012  . Abdominal pain 03/30/2012  . HEPATIC CYST 02/18/2010  . Chronic pain syndrome 02/08/2010  . Vitamin B12 deficiency 10/04/2009  . GERD 01/04/2009  . ABDOMINAL PAIN-EPIGASTRIC 01/04/2009  . DYSTHYMIA 08/10/2008  . Venous (peripheral) insufficiency 08/10/2008  . Irritable bowel syndrome 08/10/2008  . Back pain 08/10/2008  . CIGARETTE SMOKER 03/06/2008  . Hx of adenomatous polyp of colon 12/06/2007  . BRONCHITIS, RECURRENT 12/06/2007  . DIVERTICULOSIS OF COLON 12/06/2007  . OSTEOPENIA 12/06/2007  . CALCULUS, KIDNEY 10/07/2007  . VERTIGO 10/07/2007  . [id number]) 10/07/2007   PCP:  Biagio Borg, MD Pharmacy:   [facility], [place] [address] Phone: [phone] Fax: [place], [place] - [address] [place] [address] Phone: [phone] Fax: [phone]     Social Determinants of Health (SDOH) Interventions    Readmission Risk  Interventions No flowsheet data found.

## 2019-12-27 NOTE — Progress Notes (Signed)
Pt taken off Bipap per ABG results. Placed on 4L . Sp02 is 99%

## 2019-12-27 NOTE — Progress Notes (Signed)
Pt off BIPAP per ABG results (see results overview). Patient SpO2 at 95-99% on 4L [place]. Pt is also Alert and Oriented X4 and non-somnolent. Transferred pt from bed to [facility] then to chair will no issues. Will continue to monitor.

## 2019-12-27 NOTE — Progress Notes (Signed)
2 Days Post-Op    CC: Abdominal pain, nausea and vomiting  Subjective: Placed back on BiPAP last evening.  Lying in bed with a productive cough.  Objective: Vital signs in last 24 hours: Temp:  [98.7 F (37.1 C)-99.1 F (37.3 C)] 98.8 F (37.1 C) (12/30 0800) Pulse Rate:  [53-97] 57 (12/30 0900) Resp:  [14-44] 14 (12/30 0900) BP: (104-148)/(50-92) 134/75 (12/30 0900) SpO2:  [90 %-99 %] 98 % (12/30 0900) Weight:  [59.2 kg] 59.2 kg (12/30 0500) Last BM Date: 12/25/19 2160 IV 400 urine Emesis x1 BM x3 T-max 99.1 vital signs are stable. ABGs this a.m. 7.45, PCO2 53, PO2 109, bicarb 9, O2 sats 98%. K+ 5.7, bilirubin 1.7, AST 33, ALT 10, alk phos 69, H/H 16/47 No radiology exams today.  Film 12/25/2019 shows increasing basilar opacities consistent with atelectasis or pneumonia with possible small effusions. Intake/Output from previous day: 12/29 0701 - 12/30 0700 In: 2160.2 [I.V.:1653.2; IV Piggyback:507] Out: 401 [Urine:400; Emesis/NG output:1] Intake/Output this shift: No intake/output data recorded.  General appearance: alert, cooperative and no distress Resp: Currently on nasal cannula, she has coarse productive cough.  Sats are currently good on nasal cannula. GI: soft, non-tender; bowel sounds normal; no masses,  no organomegaly  Lab Results:  Recent Labs    12/26/19 0259 12/27/19 0404 12/27/19 0426  WBC 7.8 6.3  --   HGB 14.7 15.0 16.0*  HCT 48.2* 49.2* 47.0*  PLT 134* 104*  --     BMET Recent Labs    12/26/19 0259 12/27/19 0426 12/27/19 0552  NA 143 139 140  K 4.3 4.4 5.7*  CL 102  --  99  CO2 33*  --  29  GLUCOSE 119*  --  85  BUN 5*  --  7*  CREATININE 0.68  --  0.67  CALCIUM 8.2*  --  8.5*   PT/INR No results for input(s): LABPROT, INR in the last 72 hours.  Recent Labs  Lab 12/23/19 0948 12/24/19 0500 12/26/19 0259 12/27/19 0552  AST 36 20 17 33  ALT '12 9 10 10  ' ALKPHOS 97 80 68 69  BILITOT 0.5 0.9 0.8 1.7*  PROT 5.8* 5.0* 5.2*  5.5*  ALBUMIN 3.3* 2.6* 2.5* 2.7*     Lipase     Component Value Date/Time   LIPASE 24 12/26/2019 0259     Medications: . ARIPiprazole  2 mg Oral QHS  . busPIRone  10 mg Oral TID  . chlorhexidine  15 mL Mouth Rinse BID  . Chlorhexidine Gluconate Cloth  6 each Topical Daily  . cholecalciferol  2,000 Units Oral QPC lunch  . enoxaparin (LOVENOX) injection  40 mg Subcutaneous QHS  . folic acid  1 mg Oral QPC lunch  . gabapentin  300 mg Oral TID  . indomethacin  100 mg Rectal Once  . mouth rinse  15 mL Mouth Rinse q12n4p  . morphine  60 mg Oral Q12H  . mupirocin ointment  1 application Nasal BID  . pantoprazole  40 mg Oral QHS  . potassium chloride  40 mEq Oral Once  . rosuvastatin  5 mg Oral Q M,W,F-1800  . sertraline  200 mg Oral QHS  . vitamin B-12  100 mcg Oral QPC lunch    Assessment/Plan Acute hypercapnic respiratory failure/encephalopathy -resolving  Chronic pain syndrome -MS Contin/Neurontin at home Hypertension Hx alcohol dependence ?  Deconditioned   80F acute cholecystitis, choledocholithiasis -s/p ERCP 12/28, no common bile duct stones were found and sphincterotomy performed -  transferred to ICU post-procedure for acute respiratory failure, CCM following  -Respiratory failure improved/transfer to floor later today -LFT's stable/lipase 24  ID -unasyn 12/26>> day 5 FEN -IVF, NPO VTE -SCDs, lovenox Foley -none  Plan: Patient needs time to get over her pulmonary process.  I would start her on clear liquids and advance as tolerated.  She is not acutely ill from her gallbladder disease.  I would let her get over her pulmonary issues, before going forward with cholecystectomy.      LOS: 4 days    Denise King 12/27/2019 Please see Amion

## 2019-12-27 NOTE — Evaluation (Signed)
Physical Therapy Evaluation Patient Details Name: Denise King MRN: [id number] DOB: May 04, 1951 Today's Date: 12/27/2019   History of Present Illness  68 year old Caucasian female with past medical history significant for irritable bowel syndrome, chronic pain syndrome, hypertension, GERD, alcohol, diverticulosis, anxiety and anemia.  Patient was admitted with gallstone cholecystitis. Pt underwent ERCP on 12/28 with subsequent hypercarbic resp failure.  Clinical Impression  Pt presents to PT with deficits in functional mobility, gait, balance, endurance, strength, power, cognition, and cardiopulmonary function. Pt slightly limited by reports of lightheadedness during ambulation, but otherwise requires limited assistance to perform household level mobility. Pt reports her spouse, who is the primary caretaker available, went to the emergency department on the same day she was admitted and she has been unable to contact him recently. Spouse's health will need to be determined prior to discharge home. If pt does not have support from spouse then she may require temporary SNF placement due to reduced activity tolerance and falls history.    Follow Up Recommendations Home health PT;Supervision/Assistance - 24 hour(if no 24/7 assist may need SNF)    Equipment Recommendations  None recommended by PT(pt owns necessary DME)    Recommendations for Other Services       Precautions / Restrictions Precautions Precautions: Fall Restrictions Weight Bearing Restrictions: No      Mobility  Bed Mobility Overal bed mobility: Needs Assistance Bed Mobility: Sit to Supine       Sit to supine: Supervision      Transfers Overall transfer level: Needs assistance Equipment used: Rolling walker (2 wheeled) Transfers: Sit to/from Stand Sit to Stand: Min guard;Min assist(minA progressing to minG on 2nd attempt)            Ambulation/Gait Ambulation/Gait assistance: Min guard Gait Distance  (Feet): 35 Feet Assistive device: Rolling walker (2 wheeled) Gait Pattern/deviations: Step-to pattern Gait velocity: reduced Gait velocity interpretation: <1.8 ft/sec, indicate of risk for recurrent falls General Gait Details: pt with slow but steady step to gait pattern, reduced step length bilaterally  Stairs            Wheelchair Mobility    Modified Rankin (Stroke Patients Only)       Balance Overall balance assessment: Needs assistance Sitting-balance support: Feet supported;No upper extremity supported Sitting balance-Leahy Scale: Fair Sitting balance - Comments: close supervision   Standing balance support: Bilateral upper extremity supported Standing balance-Leahy Scale: Fair Standing balance comment: minG with BUE support of RW                             Pertinent Vitals/Pain Pain Assessment: Faces Faces Pain Scale: Hurts little more Pain Location: back Pain Descriptors / Indicators: Grimacing Pain Intervention(s): Limited activity within patient's tolerance    Home Living Family/patient expects to be discharged to:: Private residence Living Arrangements: Spouse/significant other;Children Available Help at Discharge: Family;Available PRN/intermittently(spouse with recent trip to ER per pt, need to check health) Type of Home: House Home Access: Stairs to enter Entrance Stairs-Rails: None Entrance Stairs-Number of Steps: 3 Home Layout: One level Home Equipment: Walker - 2 wheels;Walker - 4 wheels;Cane - single point;Shower seat      Prior Function Level of Independence: Needs assistance   Gait / Transfers Assistance Needed: household distances with support of furniture, limited community distances with Rollator           Hand Dominance   Dominant Hand: Right    Extremity/Trunk Assessment   Upper Extremity Assessment  Upper Extremity Assessment: Overall WFL for tasks assessed    Lower Extremity Assessment Lower Extremity  Assessment: Generalized weakness    Cervical / Trunk Assessment Cervical / Trunk Assessment: Normal  Communication   Communication: No difficulties  Cognition Arousal/Alertness: Awake/alert Behavior During Therapy: WFL for tasks assessed/performed Overall Cognitive Status: Impaired/Different from baseline Area of Impairment: Orientation                 Orientation Level: Disoriented to;Time                    General Comments General comments (skin integrity, edema, etc.): VSS, pt endorsing feeling lightheaded during OOB activity. Pt on 4L [place]    Exercises     Assessment/Plan    PT Assessment Patient needs continued PT services  PT Problem List Decreased strength;Decreased activity tolerance;Decreased balance;Decreased mobility;Decreased cognition;Decreased knowledge of use of DME;Decreased safety awareness;Decreased knowledge of precautions;Cardiopulmonary status limiting activity       PT Treatment Interventions DME instruction;Gait training;Stair training;Functional mobility training;Therapeutic activities;Therapeutic exercise;Balance training;Neuromuscular re-education;Patient/family education    PT Goals (Current goals can be found in the Care Plan section)  Acute Rehab PT Goals Patient Stated Goal: To return home and reduce falls risk PT Goal Formulation: With patient Time For Goal Achievement: 01/10/20 Potential to Achieve Goals: Good    Frequency Min 3X/week   Barriers to discharge        Co-evaluation               AM-PAC PT "6 Clicks" Mobility  Outcome Measure Help needed turning from your back to your side while in a flat bed without using bedrails?: None Help needed moving from lying on your back to sitting on the side of a flat bed without using bedrails?: None Help needed moving to and from a bed to a chair (including a wheelchair)?: A Little Help needed standing up from a chair using your arms (e.g., wheelchair or bedside chair)?: A  Little Help needed to walk in hospital room?: A Little Help needed climbing 3-5 steps with a railing? : A Little 6 Click Score: 20    End of Session Equipment Utilized During Treatment: Oxygen Activity Tolerance: Patient tolerated treatment well Patient left: in bed;with bed alarm set;with call bell/phone within reach Nurse Communication: Mobility status PT Visit Diagnosis: Muscle weakness (generalized) (M62.81)    Time: [id number] PT Time Calculation (min) (ACUTE ONLY): 28 min   Charges:   PT Evaluation $PT Eval High Complexity: 1 High          Zenaida Niece, PT, DPT Acute Rehabilitation Pager: [phone]   Zenaida Niece 12/27/2019, 12:31 PM

## 2019-12-28 DIAGNOSIS — J9602 Acute respiratory failure with hypercapnia: Secondary | ICD-10-CM | POA: Diagnosis not present

## 2019-12-28 DIAGNOSIS — I5033 Acute on chronic diastolic (congestive) heart failure: Secondary | ICD-10-CM

## 2019-12-28 DIAGNOSIS — F419 Anxiety disorder, unspecified: Secondary | ICD-10-CM | POA: Diagnosis present

## 2019-12-28 DIAGNOSIS — J9601 Acute respiratory failure with hypoxia: Secondary | ICD-10-CM

## 2019-12-28 DIAGNOSIS — F319 Bipolar disorder, unspecified: Secondary | ICD-10-CM

## 2019-12-28 DIAGNOSIS — F1010 Alcohol abuse, uncomplicated: Secondary | ICD-10-CM | POA: Diagnosis present

## 2019-12-28 LAB — CBC WITH DIFFERENTIAL/PLATELET
Abs Immature Granulocytes: 0.02 10*3/uL (ref 0.00–0.07)
Basophils Absolute: 0 10*3/uL (ref 0.0–0.1)
Basophils Relative: 1 %
Eosinophils Absolute: 0.1 10*3/uL (ref 0.0–0.5)
Eosinophils Relative: 1 %
HCT: 42.6 % (ref 36.0–46.0)
Hemoglobin: 13.7 g/dL (ref 12.0–15.0)
Immature Granulocytes: 0 %
Lymphocytes Relative: 37 %
Lymphs Abs: 2.3 10*3/uL (ref 0.7–4.0)
MCH: 32.4 pg (ref 26.0–34.0)
MCHC: 32.2 g/dL (ref 30.0–36.0)
MCV: 100.7 fL — ABNORMAL HIGH (ref 80.0–100.0)
Monocytes Absolute: 0.5 10*3/uL (ref 0.1–1.0)
Monocytes Relative: 8 %
Neutro Abs: 3.4 10*3/uL (ref 1.7–7.7)
Neutrophils Relative %: 53 %
Platelets: 103 10*3/uL — ABNORMAL LOW (ref 150–400)
RBC: 4.23 MIL/uL (ref 3.87–5.11)
RDW: 13 % (ref 11.5–15.5)
WBC: 6.3 10*3/uL (ref 4.0–10.5)
nRBC: 0 % (ref 0.0–0.2)

## 2019-12-28 LAB — COMPREHENSIVE METABOLIC PANEL
ALT: 10 U/L (ref 0–44)
AST: 24 U/L (ref 15–41)
Albumin: 2.3 g/dL — ABNORMAL LOW (ref 3.5–5.0)
Alkaline Phosphatase: 69 U/L (ref 38–126)
Anion gap: 9 (ref 5–15)
BUN: 8 mg/dL (ref 8–23)
CO2: 37 mmol/L — ABNORMAL HIGH (ref 22–32)
Calcium: 8 mg/dL — ABNORMAL LOW (ref 8.9–10.3)
Chloride: 92 mmol/L — ABNORMAL LOW (ref 98–111)
Creatinine, Ser: 0.68 mg/dL (ref 0.44–1.00)
GFR calc Af Amer: >60 mL/min (ref >60)
GFR calc non Af Amer: >60 mL/min (ref >60)
Glucose, Bld: 84 mg/dL (ref 70–99)
Potassium: 3.4 mmol/L — ABNORMAL LOW (ref 3.5–5.1)
Sodium: 138 mmol/L (ref 135–145)
Total Bilirubin: 1.2 mg/dL (ref 0.3–1.2)
Total Protein: 4.8 g/dL — ABNORMAL LOW (ref 6.5–8.1)

## 2019-12-28 LAB — BRAIN NATRIURETIC PEPTIDE: B Natriuretic Peptide: 893.7 pg/mL — ABNORMAL HIGH (ref 0.0–100.0)

## 2019-12-28 LAB — MAGNESIUM: Magnesium: 1.6 mg/dL — ABNORMAL LOW (ref 1.7–2.4)

## 2019-12-28 MED ORDER — MAGNESIUM SULFATE 2 GM/50ML IV SOLN
2.0000 g | Freq: Once | INTRAVENOUS | Status: AC
  Administered 2019-12-28: 2 g via INTRAVENOUS
  Filled 2019-12-28: qty 50

## 2019-12-28 MED ORDER — NICOTINE 21 MG/24HR TD PT24
21.0000 mg | MEDICATED_PATCH | Freq: Every day | TRANSDERMAL | Status: DC
  Administered 2019-12-28 – 2020-01-08 (×12): 21 mg via TRANSDERMAL
  Filled 2019-12-28 (×13): qty 1

## 2019-12-28 MED ORDER — FUROSEMIDE 10 MG/ML IJ SOLN
20.0000 mg | Freq: Two times a day (BID) | INTRAMUSCULAR | Status: DC
  Administered 2019-12-28 – 2020-01-04 (×13): 20 mg via INTRAVENOUS
  Filled 2019-12-28 (×15): qty 2

## 2019-12-28 NOTE — Progress Notes (Signed)
3 Days Post-Op    CC: Abdominal pain, nausea and vomiting  Subjective: Transferred to the floor yesterday.  Still has a productive cough and requiring [place] O2.  Had some mild abd pain with clears yesterday but resolved quickly  Objective: Vital signs in last 24 hours: Temp:  [98.2 F (36.8 C)-98.9 F (37.2 C)] 98.2 F (36.8 C) (12/31 0513) Pulse Rate:  [57-73] 67 (12/31 0513) Resp:  [14-18] 16 (12/30 2036) BP: (116-146)/(56-78) 146/78 (12/31 0513) SpO2:  [92 %-98 %] 97 % (12/31 0513) Last BM Date: 12/27/19  Intake/Output from previous day: 12/30 0701 - 12/31 0700 In: 167.5 [P.O.:120; IV Piggyback:47.5] Out: 2125 [Urine:2125] Intake/Output this shift: No intake/output data recorded.  General appearance: alert, cooperative and no distress Resp: Currently on nasal cannula, she has coarse productive cough.  Sats are currently good on nasal cannula. GI: soft, non-tender; bowel sounds normal; no masses,  no organomegaly  Lab Results:  Recent Labs    12/27/19 0404 12/27/19 0426 12/28/19 0336  WBC 6.3  --  6.3  HGB 15.0 16.0* 13.7  HCT 49.2* 47.0* 42.6  PLT 104*  --  103*    BMET Recent Labs    12/27/19 0552 12/28/19 0336  NA 140 138  K 5.7* 3.4*  CL 99 92*  CO2 29 37*  GLUCOSE 85 84  BUN 7* 8  CREATININE 0.67 0.68  CALCIUM 8.5* 8.0*   PT/INR No results for input(s): LABPROT, INR in the last 72 hours.  Recent Labs  Lab 12/23/19 0948 12/24/19 0500 12/26/19 0259 12/27/19 0552 12/28/19 0336  AST 36 20 17 33 24  ALT 12 9 10 10 10   ALKPHOS 97 80 68 69 69  BILITOT 0.5 0.9 0.8 1.7* 1.2  PROT 5.8* 5.0* 5.2* 5.5* 4.8*  ALBUMIN 3.3* 2.6* 2.5* 2.7* 2.3*     Lipase     Component Value Date/Time   LIPASE 24 12/26/2019 0259     Medications: . ARIPiprazole  2 mg Oral QHS  . busPIRone  10 mg Oral TID  . chlorhexidine  15 mL Mouth Rinse BID  . Chlorhexidine Gluconate Cloth  6 each Topical Daily  . cholecalciferol  2,000 Units Oral QPC lunch  . enoxaparin  (LOVENOX) injection  40 mg Subcutaneous QHS  . folic acid  1 mg Oral QPC lunch  . gabapentin  300 mg Oral TID  . indomethacin  100 mg Rectal Once  . mouth rinse  15 mL Mouth Rinse q12n4p  . morphine  60 mg Oral Q12H  . mupirocin ointment  1 application Nasal BID  . pantoprazole  40 mg Oral QHS  . potassium chloride  40 mEq Oral Once  . rosuvastatin  5 mg Oral Q M,W,F-1800  . sertraline  200 mg Oral QHS  . vitamin B-12  100 mcg Oral QPC lunch    Assessment/Plan Acute hypercapnic respiratory failure/encephalopathy -resolving  Chronic pain syndrome -MS Contin/Neurontin at home Hypertension Hx alcohol dependence ?  Deconditioned   36F acute cholecystitis, choledocholithiasis -s/p ERCP 12/28, no common bile duct stones were found and sphincterotomy performed - transferred to ICU post-procedure for acute respiratory failure, CCM following  -Respiratory failure improved/transfer to floor 12/30 -LFT's stable/lipase 24  ID -unasyn 12/26>> day 6, cont for 1 more day FEN -IVF, Fulls VTE -SCDs, lovenox Foley -none  Plan: Patient needs time to get over her pulmonary process.  We will advance her diet slowly.  She is not acutely ill from her gallbladder disease.  Plan to  proceed with lap chole as an outpatient if she tolerates a diet     LOS: 5 days    Denise King [phone] Please see Amion

## 2019-12-28 NOTE — Progress Notes (Signed)
Physical Therapy Treatment Patient Details Name: Denise King MRN: [id number] DOB: [birth year] Today's Date: 12/28/2019    History of Present Illness 68 year old Caucasian female with past medical history significant for irritable bowel syndrome, chronic pain syndrome, hypertension, GERD, alcohol, diverticulosis, anxiety and anemia.  Patient was admitted with gallstone cholecystitis. Pt underwent ERCP on 12/28 with subsequent hypercarbic resp failure.    PT Comments    Pt able to increase her ambulation distance today with RW while on 3 L/min o2. If she continues to progress then home with HHPT seems like a reasonable option, however it sounds as if her husband has been admitted to the hospital.  Will need to see if family can A her as needed.   Follow Up Recommendations  Home health PT;Supervision/Assistance - 24 hour;Other (comment)(may need SNF if no A at home)     Equipment Recommendations  None recommended by PT    Recommendations for Other Services       Precautions / Restrictions Precautions Precautions: Fall Restrictions Weight Bearing Restrictions: No    Mobility  Bed Mobility Overal bed mobility: Needs Assistance Bed Mobility: Supine to Sit     Supine to sit: Supervision     General bed mobility comments: HOB slightly elevated  Transfers Overall transfer level: Needs assistance Equipment used: Rolling walker (2 wheeled) Transfers: Sit to/from Stand Sit to Stand: Min guard         General transfer comment: cues for hand placement  Ambulation/Gait Ambulation/Gait assistance: Min guard Gait Distance (Feet): 80 Feet Assistive device: Rolling walker (2 wheeled) Gait Pattern/deviations: Step-through pattern;Decreased step length - right;Decreased step length - left;Drifts right/left;Trunk flexed Gait velocity: decreased   General Gait Details: Pt ambulated on 3 L/min o2 with step through pattern, but decreased step length. Cues for posture. At  times, running RW into items.   Stairs             Wheelchair Mobility    Modified Rankin (Stroke Patients Only)       Balance   Sitting-balance support: Feet supported;No upper extremity supported Sitting balance-Leahy Scale: Fair     Standing balance support: Bilateral upper extremity supported Standing balance-Leahy Scale: Fair                              Cognition Arousal/Alertness: Awake/alert Behavior During Therapy: WFL for tasks assessed/performed Overall Cognitive Status: Within Functional Limits for tasks assessed                                        Exercises      General Comments General comments (skin integrity, edema, etc.): Pt with congested cough during session, but unable to produce any phlegm.  Encouraged her to use her incentive spirometer.      Pertinent Vitals/Pain Pain Assessment: Faces Faces Pain Scale: Hurts little more Pain Location: back Pain Descriptors / Indicators: Grimacing Pain Intervention(s): Monitored during session;Repositioned;Limited activity within patient's tolerance    Home Living                      Prior Function            PT Goals (current goals can now be found in the care plan section) Acute Rehab PT Goals PT Goal Formulation: With patient Time For Goal Achievement: 01/10/20 Potential to Achieve Goals: Good  Progress towards PT goals: Progressing toward goals    Frequency    Min 3X/week      PT Plan Current plan remains appropriate    Co-evaluation              AM-PAC PT "6 Clicks" Mobility   Outcome Measure  Help needed turning from your back to your side while in a flat bed without using bedrails?: None Help needed moving from lying on your back to sitting on the side of a flat bed without using bedrails?: None Help needed moving to and from a bed to a chair (including a wheelchair)?: A Little Help needed standing up from a chair using your arms  (e.g., wheelchair or bedside chair)?: A Little Help needed to walk in hospital room?: A Little Help needed climbing 3-5 steps with a railing? : A Little 6 Click Score: 20    End of Session Equipment Utilized During Treatment: Oxygen;Gait belt Activity Tolerance: Patient tolerated treatment well Patient left: in chair;with call bell/phone within reach Nurse Communication: Mobility status PT Visit Diagnosis: Muscle weakness (generalized) (M62.81)     Time: 1120-1143(no charge for time getting o2 tank) PT Time Calculation (min) (ACUTE ONLY): 23 min  Charges:  $Gait Training: 8-22 mins                     Hendel Gatliff L. Tamala Julian, [place] Pager [phone] 12/28/2019    Galen Manila 12/28/2019, 1:44 PM

## 2019-12-28 NOTE — Progress Notes (Signed)
PROGRESS NOTE  Denise King [id number] DOB: 1951-01-26 DOA: 12/23/2019 PCP: Biagio Borg, MD  HPI/Recap of past 55 hours: 68 year old female with past medical history of anxiety, alcohol abuse, bipolar disorder, hypertension and chronic pain syndrome/on chronic narcotics presented to the emergency room on 12/26 with complaints of nausea and vomiting and abdominal pain who was admitted for acute cholecystitis/common bile duct stones.  General surgery and GI were consulted and patient went for ERCP on 12/28.  Postprocedure, she became hypoxic and hypercarbic and transferred to the ICU on BiPAP.  She was able to be weaned off the BiPAP and is currently on 4 L nasal cannula.  Surgery has been following up and plans to do cholecystectomy laparoscopically as outpatient following stabilization for breathing.  Patient doing okay, uses incentive spirometer somewhat.  Complains of some mild shortness of breath with activity and cough, nonproductive.  Assessment/Plan: Principal Problem:   Acute respiratory failure with hypoxia and hypercapnia ([place]): Initially set off by sedation although patient has not redefining factors including chronic smoking, sedation from her chronic pain medications and possibly even acute on chronic diastolic heart failure.  See below.  We will plan to watch for sedation now.  Encourage incentive spirometer use and work on weaning off oxygen.  Acute metabolic encephalopathy secondary to acute hypercarbic/hypoxic respiratory failure: Resolved. Active Problems:   CIGARETTE SMOKER/tobacco abuse: Nicotine patch.    Chronic pain syndrome: Continue patient's home medications and watching for sedation.   Abdominal pain  Acute on chronic diastolic heart failure: Checking stat BNP.  Patient is +5 L.  She had an echocardiogram done April of this year which noted preserved ejection fraction diastolic disease.  She is at 5 L and continue to follow her strict input/output and  daily weights.    Acute cholecystitis/choledocholithiasis causing nausea/vomiting and right upper quadrant abdominal pain: Status post ERCP with stent placement.  General surgery plans to do outpatient laparoscopic cholecystectomy once breathing is stabilized.    Bipolar disorder ([place]): Continue Abilify.    Alcohol abuse: Stable, watching for withdrawals.   Anxiety: Continue home medication   Code Status: Full code  Family Communication: Declined for me to call family  Disposition Plan: Discharge once weaned off of oxygen   Consultants:  General surgery  GI  Critical care  Procedures:  Status post ERCP done 12/27  Antimicrobials:  IV Unasyn 12/26-present  DVT prophylaxis: Lovenox   Objective: Vitals:   12/27/19 2036 12/28/19 0513  BP: (!) 116/58 (!) 146/78  Pulse: 65 67  Resp: 16   Temp: 98.8 F (37.1 C) 98.2 F (36.8 C)  SpO2: 92% 97%    Intake/Output Summary (Last 24 hours) at 12/28/2019 1319 Last data filed at 12/28/2019 [id number] Gross per 24 hour  Intake 440 ml  Output 2250 ml  Net -1810 ml   Filed Weights   12/23/19 1225 12/27/19 0500  Weight: 48.5 kg 59.2 kg   Body mass index is 23.12 kg/m.  Exam:   General: Alert and oriented x3, no acute distress  HEENT: Normocephalic, atraumatic, mucous membranes are moist  Neck: Supple, no JVD  Cardiovascular: Regular rate and rhythm, S1-S2  Respiratory: Decreased breath sounds throughout with rhonchi at the bases  Abdomen: Soft, mild tenderness in the right upper quadrant, nondistended, hypoactive bowel sounds  Musculoskeletal: No clubbing or cyanosis, trace pitting edema  Skin: No skin breaks, tears or lesions  Psychiatry: Appropriate, no evidence of psychoses  Neuro: No focal deficits   Data Reviewed: CBC: Recent  Labs  Lab 12/23/19 0948 12/24/19 0500 12/26/19 0259 12/26/19 1920 12/27/19 0404 12/27/19 0426 12/28/19 0336  WBC 6.4 6.0 7.8  --  6.3  --  6.3  NEUTROABS  --   --    --   --   --   --  3.4  HGB 15.8* 14.4 14.7 17.3* 15.0 16.0* 13.7  HCT 49.9* 45.6 48.2* 51.0* 49.2* 47.0* 42.6  MCV 101.6* 102.5* 104.8*  --  106.3*  --  100.7*  PLT 181 153 134*  --  104*  --  [id number]*   Basic Metabolic Panel: Recent Labs  Lab 12/23/19 0948 12/24/19 0500 12/25/19 1534 12/26/19 0259 12/26/19 1920 12/27/19 0426 12/27/19 0552 12/28/19 0336  NA 135 138  --  143 138 139 140 138  K 3.9 3.2*  --  4.3 4.2 4.4 5.7* 3.4*  CL 92* 99  --  102  --   --  99 92*  CO2 32 31  --  33*  --   --  29 37*  GLUCOSE 121* 83  --  119*  --   --  85 84  BUN <5* <5*  --  5*  --   --  7* 8  CREATININE 0.85 0.71  --  0.68  --   --  0.67 0.68  CALCIUM 8.6* 8.0*  --  8.2*  --   --  8.5* 8.0*  MG  --   --  1.5*  --   --   --  2.0 1.6*   GFR: Estimated Creatinine Clearance: 57.2 mL/min (by C-G formula based on SCr of 0.68 mg/dL). Liver Function Tests: Recent Labs  Lab 12/23/19 0948 12/24/19 0500 12/26/19 0259 12/27/19 0552 12/28/19 0336  AST 36 20 17 33 24  ALT 12 9 10 10 10   ALKPHOS 97 80 68 69 69  BILITOT 0.5 0.9 0.8 1.7* 1.2  PROT 5.8* 5.0* 5.2* 5.5* 4.8*  ALBUMIN 3.3* 2.6* 2.5* 2.7* 2.3*   Recent Labs  Lab 12/23/19 0948 12/26/19 0259  LIPASE 20 24   No results for input(s): AMMONIA in the last 168 hours. Coagulation Profile: No results for input(s): INR, PROTIME in the last 168 hours. Cardiac Enzymes: No results for input(s): CKTOTAL, CKMB, CKMBINDEX, TROPONINI in the last 168 hours. BNP (last 3 results) No results for input(s): PROBNP in the last 8760 hours. HbA1C: No results for input(s): HGBA1C in the last 72 hours. CBG: Recent Labs  Lab 12/26/19 1854  GLUCAP 104*   Lipid Profile: No results for input(s): CHOL, HDL, LDLCALC, TRIG, CHOLHDL, LDLDIRECT in the last 72 hours. Thyroid Function Tests: No results for input(s): TSH, T4TOTAL, FREET4, T3FREE, THYROIDAB in the last 72 hours. Anemia Panel: No results for input(s): VITAMINB12, FOLATE, FERRITIN, TIBC, IRON,  RETICCTPCT in the last 72 hours. Urine analysis:    Component Value Date/Time   COLORURINE YELLOW 12/23/2019 1622   APPEARANCEUR CLEAR 12/23/2019 1622   LABSPEC 1.044 (H) 12/23/2019 1622   PHURINE 6.0 12/23/2019 1622   GLUCOSEU NEGATIVE 12/23/2019 1622   GLUCOSEU NEGATIVE 12/22/2017 1650   HGBUR NEGATIVE 12/23/2019 1622   BILIRUBINUR NEGATIVE 12/23/2019 1622   BILIRUBINUR neg 02/07/2016 1627   KETONESUR NEGATIVE 12/23/2019 1622   PROTEINUR NEGATIVE 12/23/2019 1622   UROBILINOGEN 0.2 12/22/2017 1650   NITRITE NEGATIVE 12/23/2019 1622   LEUKOCYTESUR NEGATIVE 12/23/2019 1622   Sepsis Labs: @LABRCNTIP (procalcitonin:4,lacticidven:4)  ) Recent Results (from the past 240 hour(s))  SARS CORONAVIRUS 2 (TAT 6-24 HRS) Nasopharyngeal Nasopharyngeal Swab  Status: None   Collection Time: 12/23/19  3:21 PM   Specimen: Nasopharyngeal Swab  Result Value Ref Range Status   SARS Coronavirus 2 NEGATIVE NEGATIVE Final    Comment: (NOTE) SARS-CoV-2 target nucleic acids are NOT DETECTED. The SARS-CoV-2 RNA is generally detectable in upper and lower respiratory specimens during the acute phase of infection. Negative results do not preclude SARS-CoV-2 infection, do not rule out co-infections with other pathogens, and should not be used as the sole basis for treatment or other patient management decisions. Negative results must be combined with clinical observations, patient history, and epidemiological information. The expected result is Negative. Fact Sheet for Patients: SugarRoll.be Fact Sheet for Healthcare Providers: https://www.woods-mathews.com/ This test is not yet approved or cleared by the [place] FDA and  has been authorized for detection and/or diagnosis of SARS-CoV-2 by FDA under an Emergency Use Authorization (EUA). This EUA will remain  in effect (meaning this test can be used) for the duration of the COVID-19 declaration under  Section 56 4(b)(1) of the Act, 21 U.S.C. section 360bbb-3(b)(1), unless the authorization is terminated or revoked sooner. Performed at [facility], [place] [address], [place], [place] [address]   Surgical PCR screen     Status: None   Collection Time: 12/25/19  2:12 PM   Specimen: Nasal Mucosa; Nasal Swab  Result Value Ref Range Status   MRSA, PCR NEGATIVE NEGATIVE Final   Staphylococcus aureus NEGATIVE NEGATIVE Final    Comment: (NOTE) The Xpert SA Assay (FDA approved for NASAL specimens in patients 38 years of age and older), is one component of a comprehensive surveillance program. It is not intended to diagnose infection nor to guide or monitor treatment. Performed at [facility], [place] [address] [name], [place] [address]   MRSA PCR Screening     Status: None   Collection Time: 12/25/19  2:12 PM   Specimen: Nasal Mucosa; Nasopharyngeal  Result Value Ref Range Status   MRSA by PCR NEGATIVE NEGATIVE Final    Comment:        The GeneXpert MRSA Assay (FDA approved for NASAL specimens only), is one component of a comprehensive MRSA colonization surveillance program. It is not intended to diagnose MRSA infection nor to guide or monitor treatment for MRSA infections. Performed at [facility], [place] [address], [place], [place] [address]   Culture, blood (routine x 2)     Status: None (Preliminary result)   Collection Time: 12/25/19  3:26 PM   Specimen: BLOOD RIGHT HAND  Result Value Ref Range Status   Specimen Description BLOOD RIGHT HAND  Final   Special Requests   Final    BOTTLES DRAWN AEROBIC AND ANAEROBIC Blood Culture results may not be optimal due to an inadequate volume of blood received in culture bottles   Culture   Final    NO GROWTH 3 DAYS Performed at [facility], [place] [address], [place], [place] [address]    Report Status PENDING  Incomplete  Culture, blood (routine x 2)     Status: None (Preliminary result)   Collection Time:  12/25/19  3:34 PM   Specimen: BLOOD LEFT ARM  Result Value Ref Range Status   Specimen Description BLOOD LEFT ARM LOWER  Final   Special Requests   Final    BOTTLES DRAWN AEROBIC AND ANAEROBIC Blood Culture results may not be optimal due to an inadequate volume of blood received in culture bottles   Culture  Final    NO GROWTH 3 DAYS Performed at [facility], [place] [address] [place], [place] [address]    Report Status PENDING  Incomplete      Studies: No results found.  Scheduled Meds: . ARIPiprazole  2 mg Oral QHS  . busPIRone  10 mg Oral TID  . chlorhexidine  15 mL Mouth Rinse BID  . Chlorhexidine Gluconate Cloth  6 each Topical Daily  . cholecalciferol  2,000 Units Oral QPC lunch  . enoxaparin (LOVENOX) injection  40 mg Subcutaneous QHS  . folic acid  1 mg Oral QPC lunch  . gabapentin  300 mg Oral TID  . indomethacin  100 mg Rectal Once  . mouth rinse  15 mL Mouth Rinse q12n4p  . morphine  60 mg Oral Q12H  . mupirocin ointment  1 application Nasal BID  . nicotine  21 mg Transdermal Daily  . pantoprazole  40 mg Oral QHS  . potassium chloride  40 mEq Oral Once  . rosuvastatin  5 mg Oral Q M,W,F-1800  . sertraline  200 mg Oral QHS  . vitamin B-12  100 mcg Oral QPC lunch    Continuous Infusions: . ampicillin-sulbactam (UNASYN) IV 3 g (12/28/19 0835)     LOS: 5 days     Annita Brod, MD [facility]  To reach me or the doctor on call, go to: [email or web address] Password [email or web address]  12/28/2019, 1:19 PM

## 2019-12-29 LAB — COMPREHENSIVE METABOLIC PANEL
ALT: 7 U/L (ref 0–44)
AST: 17 U/L (ref 15–41)
Albumin: 2.3 g/dL — ABNORMAL LOW (ref 3.5–5.0)
Alkaline Phosphatase: 70 U/L (ref 38–126)
Anion gap: 14 (ref 5–15)
BUN: 6 mg/dL — ABNORMAL LOW (ref 8–23)
CO2: 38 mmol/L — ABNORMAL HIGH (ref 22–32)
Calcium: 8.1 mg/dL — ABNORMAL LOW (ref 8.9–10.3)
Chloride: 88 mmol/L — ABNORMAL LOW (ref 98–111)
Creatinine, Ser: 0.74 mg/dL (ref 0.44–1.00)
GFR calc Af Amer: >60 mL/min (ref >60)
GFR calc non Af Amer: >60 mL/min (ref >60)
Glucose, Bld: 81 mg/dL (ref 70–99)
Potassium: 2.9 mmol/L — ABNORMAL LOW (ref 3.5–5.1)
Sodium: 140 mmol/L (ref 135–145)
Total Bilirubin: 1 mg/dL (ref 0.3–1.2)
Total Protein: 5.1 g/dL — ABNORMAL LOW (ref 6.5–8.1)

## 2019-12-29 LAB — CBC
HCT: 45.3 % (ref 36.0–46.0)
Hemoglobin: 14.3 g/dL (ref 12.0–15.0)
MCH: 31.5 pg (ref 26.0–34.0)
MCHC: 31.6 g/dL (ref 30.0–36.0)
MCV: 99.8 fL (ref 80.0–100.0)
Platelets: 116 10*3/uL — ABNORMAL LOW (ref 150–400)
RBC: 4.54 MIL/uL (ref 3.87–5.11)
RDW: 12.7 % (ref 11.5–15.5)
WBC: 6.4 10*3/uL (ref 4.0–10.5)
nRBC: 0 % (ref 0.0–0.2)

## 2019-12-29 MED ORDER — POTASSIUM CHLORIDE CRYS ER 20 MEQ PO TBCR
40.0000 meq | EXTENDED_RELEASE_TABLET | Freq: Three times a day (TID) | ORAL | Status: AC
  Administered 2019-12-29 (×3): 40 meq via ORAL
  Filled 2019-12-29 (×3): qty 2

## 2019-12-29 MED ORDER — MAGNESIUM SULFATE 2 GM/50ML IV SOLN
2.0000 g | Freq: Once | INTRAVENOUS | Status: AC
  Administered 2019-12-29: 2 g via INTRAVENOUS
  Filled 2019-12-29: qty 50

## 2019-12-29 NOTE — Progress Notes (Signed)
4 Days Post-Op ERCP   CC: Abdominal pain, nausea and vomiting  Subjective:   Still requiring 3 L [place] O2.  No pain with liquids yesterday.  Eating solid foods this AM  Objective: Vital signs in last 24 hours: Temp:  [97.8 F (36.6 C)-98.2 F (36.8 C)] 97.8 F (36.6 C) (01/01 0553) Pulse Rate:  [62-65] 65 (01/01 0553) Resp:  [16] 16 (12/31 1515) BP: (119-141)/(65-73) 119/65 (01/01 0553) SpO2:  [94 %-95 %] 94 % (01/01 0553) Weight:  [58.1 kg-59 kg] 59 kg (01/01 0500) Last BM Date: 12/29/19  Intake/Output from previous day: 12/31 0701 - 01/01 0700 In: 1990 [P.O.:1040; IV Piggyback:950] Out: 4100 [Urine:4100] Intake/Output this shift: Total I/O In: -  Out: 600 [Urine:600]  General appearance: alert, cooperative and no distress Resp: Currently on nasal cannula, she has coarse productive cough.  Sats are currently good on nasal cannula. GI: soft, non-tender; bowel sounds normal; no masses,  no organomegaly  Lab Results:  Recent Labs    12/28/19 0336 12/29/19 0232  WBC 6.3 6.4  HGB 13.7 14.3  HCT 42.6 45.3  PLT 103* 116*    BMET Recent Labs    12/28/19 0336 12/29/19 0232  NA 138 140  K 3.4* 2.9*  CL 92* 88*  CO2 37* 38*  GLUCOSE 84 81  BUN 8 6*  CREATININE 0.68 0.74  CALCIUM 8.0* 8.1*   PT/INR No results for input(s): LABPROT, INR in the last 72 hours.  Recent Labs  Lab 12/24/19 0500 12/26/19 0259 12/27/19 0552 12/28/19 0336 12/29/19 0232  AST 20 17 33 24 17  ALT 9 10 10 10 7   ALKPHOS 80 68 69 69 70  BILITOT 0.9 0.8 1.7* 1.2 1.0  PROT 5.0* 5.2* 5.5* 4.8* 5.1*  ALBUMIN 2.6* 2.5* 2.7* 2.3* 2.3*     Lipase     Component Value Date/Time   LIPASE 24 12/26/2019 0259     Medications: . ARIPiprazole  2 mg Oral QHS  . busPIRone  10 mg Oral TID  . chlorhexidine  15 mL Mouth Rinse BID  . Chlorhexidine Gluconate Cloth  6 each Topical Daily  . cholecalciferol  2,000 Units Oral QPC lunch  . enoxaparin (LOVENOX) injection  40 mg Subcutaneous QHS  .  folic acid  1 mg Oral QPC lunch  . furosemide  20 mg Intravenous Q12H  . gabapentin  300 mg Oral TID  . indomethacin  100 mg Rectal Once  . mouth rinse  15 mL Mouth Rinse q12n4p  . morphine  60 mg Oral Q12H  . nicotine  21 mg Transdermal Daily  . pantoprazole  40 mg Oral QHS  . potassium chloride  40 mEq Oral Once  . rosuvastatin  5 mg Oral Q M,W,F-1800  . sertraline  200 mg Oral QHS  . vitamin B-12  100 mcg Oral QPC lunch    Assessment/Plan Acute hypercapnic respiratory failure/encephalopathy -resolving  Chronic pain syndrome -MS Contin/Neurontin at home Hypertension Hx alcohol dependence ?  Deconditioned   41F acute cholecystitis, choledocholithiasis -s/p ERCP 12/28, no common bile duct stones were found and sphincterotomy performed - transferred to ICU post-procedure for acute respiratory failure, CCM following  -Respiratory failure improved/transfer to floor 12/30 -LFT's stable/lipase 24  ID -unasyn 12/26>> day 7 (Ok to d/c from a surgical standpoint) FEN -IVF, low fat diet VTE -SCDs, lovenox Foley -none  Plan: Patient needs time to get over her pulmonary process.  She is not acutely ill from her gallbladder disease.  Plan to  proceed with lap chole as an outpatient as long as she continues to tolerate a diet    LOS: 6 days    Rosario Adie [phone] Please see Amion

## 2019-12-29 NOTE — Progress Notes (Addendum)
PROGRESS NOTE  Denise King [id number] DOB: [birth year] DOA: 12/23/2019 PCP: Biagio Borg, MD  HPI/Recap of past 61 hours:  69 year old female with past medical history of anxiety, alcohol abuse, bipolar disorder, hypertension and chronic pain syndrome/on chronic narcotics presented to the emergency room on 12/26 with complaints of nausea and vomiting and abdominal pain who was admitted for acute cholecystitis/common bile duct stones.  General surgery and GI were consulted and patient went for ERCP on 12/28.  Postprocedure, she became hypoxic and hypercarbic and transferred to the ICU on BiPAP.  She was able to be weaned off the BiPAP and is currently on 4 L nasal cannula.  Surgery has been following up and plans to do cholecystectomy laparoscopically as outpatient following stabilization for breathing.  12/29/19: Seen and examined.  Reports persistent productive cough.  Denies any chest pain or dyspnea at rest.  On 3 L with oxygen saturation of 94% in the room.    Assessment/Plan: Principal Problem:   Acute respiratory failure with hypoxia and hypercapnia (HCC) Active Problems:   CIGARETTE SMOKER   Chronic pain syndrome   Abdominal pain   Nausea & vomiting   Common bile duct stone   Acute cholecystitis   Abnormal MRI of abdomen   Cholelithiasis   Bipolar disorder (HCC)   Alcohol abuse   Anxiety   Acute on chronic diastolic CHF (congestive heart failure) (HCC)  Acute hypoxic hypercarbic respiratory failure Thought to be initially set off by sedation versus acute on chronic diastolic CHF. Now off sedation Not on oxygen supplementation at baseline Currently requiring 3 L to maintain O2 saturation greater than 92%. Continue bronchodilators and pulmonary toilet Personally reviewed chest x-ray done on 12/25/2019 which showed bibasilar pulmonary infiltrates and bilateral pleural effusion Continue diuretics  Acute on chronic diastolic CHF Elevated BNP greater than  800 Continue IV diuretics Lasix 20 mg twice daily, will discharge to control blood pressures Continue strict I's and O's and daily weight Net I&O +2.6L Continue to monitor electrolytes while on diuretics, urine output, and blood pressures  Acute metabolic encephalopathy secondary to acute hypercarbic/hypoxic respiratory failure Improving Reorient as needed Fall precautions  Acute cholecystitis/choledocholithiasis Status post ERCP with stent placement. On IV Unasyn Blood cultures x2 - to date. Will need to follow-up with general surgery outpatient for possible lap chole once breathing is stabilized  Bipolar disorder Continue home medications  Alcohol use disorder Stable at this time No signs of withdrawal at this time  Chronic anxiety Continue home medications   Hypokalemia Potassium 2.9 Repleted  Hypomagnesemia Magnesium 1.6 Repleted   Code Status: Full code  Family Communication: Declined for me to call family  Disposition Plan: Discharge once weaned off of oxygen   Consultants:  General surgery  GI  Critical care  Procedures:  Status post ERCP done 12/27  Antimicrobials:  IV Unasyn 12/26-present  DVT prophylaxis: Lovenox subcu daily.    Objective: Vitals:   12/28/19 1809 12/29/19 0500 12/29/19 0553 12/29/19 0839  BP:   119/65   Pulse:   65   Resp:      Temp:   97.8 F (36.6 C)   TempSrc:   Oral   SpO2:   94%   Weight: 58.1 kg 59 kg  58.4 kg  Height:        Intake/Output Summary (Last 24 hours) at 12/29/2019 0931 Last data filed at 12/29/2019 0839 Gross per 24 hour  Intake 2230 ml  Output 5050 ml  Net -2820 ml  Filed Weights   12/28/19 1809 12/29/19 0500 12/29/19 0839  Weight: 58.1 kg 59 kg 58.4 kg    Exam:  . General: 69 y.o. year-old female well developed well nourished in no acute distress.  Alert and interactive. . Cardiovascular: Regular rate and rhythm with no rubs or gallops.  No thyromegaly or JVD noted.    Marland Kitchen Respiratory: Diffuse rales bilaterally with poor inspiratory effort.  No wheezing noted. . Abdomen: Soft nontender nondistended with normal bowel sounds x4 quadrants. . Musculoskeletal: No lower extremity edema. 2/4 pulses in all 4 extremities. Marland Kitchen Psychiatry: Mood is appropriate for condition and setting   Data Reviewed: CBC: Recent Labs  Lab 12/24/19 0500 12/26/19 0259 12/26/19 1920 12/27/19 0404 12/27/19 0426 12/28/19 0336 12/29/19 0232  WBC 6.0 7.8  --  6.3  --  6.3 6.4  NEUTROABS  --   --   --   --   --  3.4  --   HGB 14.4 14.7 17.3* 15.0 16.0* 13.7 14.3  HCT 45.6 48.2* 51.0* 49.2* 47.0* 42.6 45.3  MCV 102.5* 104.8*  --  106.3*  --  100.7* 99.8  PLT 153 134*  --  104*  --  103* [id number]*   Basic Metabolic Panel: Recent Labs  Lab 12/24/19 0500 12/25/19 1534 12/26/19 0259 12/26/19 1920 12/27/19 0426 12/27/19 0552 12/28/19 0336 12/29/19 0232  NA 138  --  143 138 139 140 138 140  K 3.2*  --  4.3 4.2 4.4 5.7* 3.4* 2.9*  CL 99  --  102  --   --  99 92* 88*  CO2 31  --  33*  --   --  29 37* 38*  GLUCOSE 83  --  119*  --   --  85 84 81  BUN <5*  --  5*  --   --  7* 8 6*  CREATININE 0.71  --  0.68  --   --  0.67 0.68 0.74  CALCIUM 8.0*  --  8.2*  --   --  8.5* 8.0* 8.1*  MG  --  1.5*  --   --   --  2.0 1.6*  --    GFR: Estimated Creatinine Clearance: 57.2 mL/min (by C-G formula based on SCr of 0.74 mg/dL). Liver Function Tests: Recent Labs  Lab 12/24/19 0500 12/26/19 0259 12/27/19 0552 12/28/19 0336 12/29/19 0232  AST 20 17 33 24 17  ALT 9 10 10 10 7   ALKPHOS 80 68 69 69 70  BILITOT 0.9 0.8 1.7* 1.2 1.0  PROT 5.0* 5.2* 5.5* 4.8* 5.1*  ALBUMIN 2.6* 2.5* 2.7* 2.3* 2.3*   Recent Labs  Lab 12/23/19 0948 12/26/19 0259  LIPASE 20 24   No results for input(s): AMMONIA in the last 168 hours. Coagulation Profile: No results for input(s): INR, PROTIME in the last 168 hours. Cardiac Enzymes: No results for input(s): CKTOTAL, CKMB, CKMBINDEX, TROPONINI in the last  168 hours. BNP (last 3 results) No results for input(s): PROBNP in the last 8760 hours. HbA1C: No results for input(s): HGBA1C in the last 72 hours. CBG: Recent Labs  Lab 12/26/19 1854  GLUCAP 104*   Lipid Profile: No results for input(s): CHOL, HDL, LDLCALC, TRIG, CHOLHDL, LDLDIRECT in the last 72 hours. Thyroid Function Tests: No results for input(s): TSH, T4TOTAL, FREET4, T3FREE, THYROIDAB in the last 72 hours. Anemia Panel: No results for input(s): VITAMINB12, FOLATE, FERRITIN, TIBC, IRON, RETICCTPCT in the last 72 hours. Urine analysis:    Component Value Date/Time  COLORURINE YELLOW 12/23/2019 1622   APPEARANCEUR CLEAR 12/23/2019 1622   LABSPEC 1.044 (H) 12/23/2019 1622   PHURINE 6.0 12/23/2019 1622   GLUCOSEU NEGATIVE 12/23/2019 1622   GLUCOSEU NEGATIVE 12/22/2017 1650   HGBUR NEGATIVE 12/23/2019 1622   BILIRUBINUR NEGATIVE 12/23/2019 1622   BILIRUBINUR neg 02/07/2016 1627   KETONESUR NEGATIVE 12/23/2019 1622   PROTEINUR NEGATIVE 12/23/2019 1622   UROBILINOGEN 0.2 12/22/2017 1650   NITRITE NEGATIVE 12/23/2019 1622   LEUKOCYTESUR NEGATIVE 12/23/2019 1622   Sepsis Labs: @LABRCNTIP (procalcitonin:4,lacticidven:4)  ) Recent Results (from the past 240 hour(s))  SARS CORONAVIRUS 2 (TAT 6-24 HRS) Nasopharyngeal Nasopharyngeal Swab     Status: None   Collection Time: 12/23/19  3:21 PM   Specimen: Nasopharyngeal Swab  Result Value Ref Range Status   SARS Coronavirus 2 NEGATIVE NEGATIVE Final    Comment: (NOTE) SARS-CoV-2 target nucleic acids are NOT DETECTED. The SARS-CoV-2 RNA is generally detectable in upper and lower respiratory specimens during the acute phase of infection. Negative results do not preclude SARS-CoV-2 infection, do not rule out co-infections with other pathogens, and should not be used as the sole basis for treatment or other patient management decisions. Negative results must be combined with clinical observations, patient history, and  epidemiological information. The expected result is Negative. Fact Sheet for Patients: SugarRoll.be Fact Sheet for Healthcare Providers: https://www.woods-mathews.com/ This test is not yet approved or cleared by the [place] FDA and  has been authorized for detection and/or diagnosis of SARS-CoV-2 by FDA under an Emergency Use Authorization (EUA). This EUA will remain  in effect (meaning this test can be used) for the duration of the COVID-19 declaration under Section 56 4(b)(1) of the Act, 21 U.S.C. section 360bbb-3(b)(1), unless the authorization is terminated or revoked sooner. Performed at [facility], [place] [address] [place], [place] [address]   Surgical PCR screen     Status: None   Collection Time: 12/25/19  2:12 PM   Specimen: Nasal Mucosa; Nasal Swab  Result Value Ref Range Status   MRSA, PCR NEGATIVE NEGATIVE Final   Staphylococcus aureus NEGATIVE NEGATIVE Final    Comment: (NOTE) The Xpert SA Assay (FDA approved for NASAL specimens in patients 36 years of age and older), is one component of a comprehensive surveillance program. It is not intended to diagnose infection nor to guide or monitor treatment. Performed at [facility], [place] [address] [place], [place] [address]   MRSA PCR Screening     Status: None   Collection Time: 12/25/19  2:12 PM   Specimen: Nasal Mucosa; Nasopharyngeal  Result Value Ref Range Status   MRSA by PCR NEGATIVE NEGATIVE Final    Comment:        The GeneXpert MRSA Assay (FDA approved for NASAL specimens only), is one component of a comprehensive MRSA colonization surveillance program. It is not intended to diagnose MRSA infection nor to guide or monitor treatment for MRSA infections. Performed at [facility], [place] [address] [place], [place] [address]   Culture, blood (routine x 2)     Status: None (Preliminary result)   Collection Time: 12/25/19  3:26 PM   Specimen:  BLOOD RIGHT HAND  Result Value Ref Range Status   Specimen Description BLOOD RIGHT HAND  Final   Special Requests   Final    BOTTLES DRAWN AEROBIC AND ANAEROBIC Blood Culture results may not be optimal due to an inadequate volume of blood received in culture bottles   Culture  Final    NO GROWTH 3 DAYS Performed at [facility], [place] [address] [place], [place] [address]    Report Status PENDING  Incomplete  Culture, blood (routine x 2)     Status: None (Preliminary result)   Collection Time: 12/25/19  3:34 PM   Specimen: BLOOD LEFT ARM  Result Value Ref Range Status   Specimen Description BLOOD LEFT ARM LOWER  Final   Special Requests   Final    BOTTLES DRAWN AEROBIC AND ANAEROBIC Blood Culture results may not be optimal due to an inadequate volume of blood received in culture bottles   Culture   Final    NO GROWTH 3 DAYS Performed at [facility], [place] [address], [place], [place] [address]    Report Status PENDING  Incomplete      Studies: No results found.  Scheduled Meds: . ARIPiprazole  2 mg Oral QHS  . busPIRone  10 mg Oral TID  . chlorhexidine  15 mL Mouth Rinse BID  . Chlorhexidine Gluconate Cloth  6 each Topical Daily  . cholecalciferol  2,000 Units Oral QPC lunch  . enoxaparin (LOVENOX) injection  40 mg Subcutaneous QHS  . folic acid  1 mg Oral QPC lunch  . furosemide  20 mg Intravenous Q12H  . gabapentin  300 mg Oral TID  . indomethacin  100 mg Rectal Once  . mouth rinse  15 mL Mouth Rinse q12n4p  . morphine  60 mg Oral Q12H  . nicotine  21 mg Transdermal Daily  . pantoprazole  40 mg Oral QHS  . potassium chloride  40 mEq Oral TID  . rosuvastatin  5 mg Oral Q M,W,F-1800  . sertraline  200 mg Oral QHS  . vitamin B-12  100 mcg Oral QPC lunch    Continuous Infusions: . ampicillin-sulbactam (UNASYN) IV 3 g (12/29/19 0440)  . magnesium sulfate bolus IVPB       LOS: 6 days     Kayleen Memos, MD [facility] Pager [phone]  If  7PM-7AM, please contact night-coverage [email or web address] Password [facility] 12/29/2019, 9:31 AM

## 2019-12-30 ENCOUNTER — Inpatient Hospital Stay (HOSPITAL_COMMUNITY): Payer: Medicare HMO

## 2019-12-30 LAB — BASIC METABOLIC PANEL
Anion gap: 12 (ref 5–15)
BUN: <5 mg/dL — ABNORMAL LOW (ref 8–23)
CO2: 42 mmol/L — ABNORMAL HIGH (ref 22–32)
Calcium: 8.1 mg/dL — ABNORMAL LOW (ref 8.9–10.3)
Chloride: 88 mmol/L — ABNORMAL LOW (ref 98–111)
Creatinine, Ser: 0.7 mg/dL (ref 0.44–1.00)
GFR calc Af Amer: >60 mL/min (ref >60)
GFR calc non Af Amer: >60 mL/min (ref >60)
Glucose, Bld: 92 mg/dL (ref 70–99)
Potassium: 3.9 mmol/L (ref 3.5–5.1)
Sodium: 142 mmol/L (ref 135–145)

## 2019-12-30 LAB — CULTURE, BLOOD (ROUTINE X 2)
Culture: NO GROWTH
Culture: NO GROWTH

## 2019-12-30 LAB — CBC
HCT: 47.4 % — ABNORMAL HIGH (ref 36.0–46.0)
Hemoglobin: 14.7 g/dL (ref 12.0–15.0)
MCH: 32 pg (ref 26.0–34.0)
MCHC: 31 g/dL (ref 30.0–36.0)
MCV: 103 fL — ABNORMAL HIGH (ref 80.0–100.0)
Platelets: 112 10*3/uL — ABNORMAL LOW (ref 150–400)
RBC: 4.6 MIL/uL (ref 3.87–5.11)
RDW: 12.8 % (ref 11.5–15.5)
WBC: 7.1 10*3/uL (ref 4.0–10.5)
nRBC: 0 % (ref 0.0–0.2)

## 2019-12-30 LAB — MAGNESIUM: Magnesium: 2.1 mg/dL (ref 1.7–2.4)

## 2019-12-30 LAB — GLUCOSE, CAPILLARY: Glucose-Capillary: 125 mg/dL — ABNORMAL HIGH (ref 70–99)

## 2019-12-30 MED ORDER — PHENYLEPHRINE HCL 0.5 % NA SOLN
1.0000 [drp] | Freq: Four times a day (QID) | NASAL | Status: DC | PRN
  Administered 2019-12-30 – 2019-12-31 (×3): 1 [drp] via NASAL
  Filled 2019-12-30 (×2): qty 15

## 2019-12-30 NOTE — Progress Notes (Signed)
PROGRESS NOTE  Denise King [id number] DOB: 29-Dec-1950 DOA: 12/23/2019 PCP: Biagio Borg, MD  Brief History   69 year old female with past medical history of anxiety, alcohol abuse, bipolar disorder, hypertension and chronic pain syndrome/on chronic narcotics presented to the emergency room on 12/26 with complaints of nausea and vomiting and abdominal pain who was admitted for acute cholecystitis/common bile duct stones. General surgery and GI were consulted and patient went for ERCP on 12/28. Postprocedure, she became hypoxic and hypercarbic and transferred to the ICU on BiPAP. She was able to be weaned off the BiPAP and is currently on 4 L nasal cannula.Surgery has been following up and plans to do cholecystectomy laparoscopically as outpatient following stabilization for breathing.  Consultants  . General Surgery . PCCM . Gastroenterology  Procedures  . None  Antibiotics   Anti-infectives (From admission, onward)   Start     Dose/Rate Route Frequency Ordered Stop   12/25/19 0930  Ampicillin-Sulbactam (UNASYN) 3 g in sodium chloride 0.9 % 100 mL IVPB     3 g 200 mL/hr over 30 Minutes Intravenous  Once 12/25/19 0928 12/25/19 0958   12/23/19 2115  Ampicillin-Sulbactam (UNASYN) 3 g in sodium chloride 0.9 % 100 mL IVPB     3 g 200 mL/hr over 30 Minutes Intravenous Every 6 hours 12/23/19 2100     12/23/19 2103  valACYclovir (VALTREX) tablet 500 mg     500 mg Oral 2 times daily PRN 12/23/19 2104      .   Subjective  The patient is resting comfortably. No new complaints.   Objective   Vitals:  Vitals:   12/30/19 1607 12/30/19 2045  BP:  131/72  Pulse:  68  Resp:  15  Temp:  98.2 F (36.8 C)  SpO2: 96% 93%   Exam:  Constitutional:  . The patient is awake, alert, and oriented x 3. No acute distress. Respiratory:  . No increased work of breathing. . No wheezes, rales, or rhonchi . No tactile fremitus Cardiovascular:  . Regular rate and rhythm . No  murmurs, ectopy, or gallups. . No lateral PMI. No thrills. Abdomen:  . Abdomen is soft, non-tender, non-distended . No hernias, masses, or organomegaly . Normoactive bowel sounds.  Musculoskeletal:  . No cyanosis, clubbing, or edema Skin:  . No rashes, lesions, ulcers . palpation of skin: no induration or nodules Neurologic:  . CN 2-12 intact . Sensation all 4 extremities intact Psychiatric:  . Mental status o Mood, affect appropriate o Orientation to person, place, time  . judgment and insight appear intact  I have personally reviewed the following:   Today's Data  . Vitals, bmp, cbc  Imaging  . CXR  Scheduled Meds: . ARIPiprazole  2 mg Oral QHS  . busPIRone  10 mg Oral TID  . chlorhexidine  15 mL Mouth Rinse BID  . Chlorhexidine Gluconate Cloth  6 each Topical Daily  . cholecalciferol  2,000 Units Oral QPC lunch  . enoxaparin (LOVENOX) injection  40 mg Subcutaneous QHS  . folic acid  1 mg Oral QPC lunch  . furosemide  20 mg Intravenous Q12H  . gabapentin  300 mg Oral TID  . indomethacin  100 mg Rectal Once  . mouth rinse  15 mL Mouth Rinse q12n4p  . morphine  60 mg Oral Q12H  . nicotine  21 mg Transdermal Daily  . pantoprazole  40 mg Oral QHS  . rosuvastatin  5 mg Oral Q M,W,F-1800  . sertraline  200  mg Oral QHS  . vitamin B-12  100 mcg Oral QPC lunch   Continuous Infusions: . ampicillin-sulbactam (UNASYN) IV 3 g (12/30/19 1550)    Principal Problem:   Acute respiratory failure with hypoxia and hypercapnia (HCC) Active Problems:   CIGARETTE SMOKER   Chronic pain syndrome   Abdominal pain   Nausea & vomiting   Common bile duct stone   Acute cholecystitis   Abnormal MRI of abdomen   Cholelithiasis   Bipolar disorder (HCC)   Alcohol abuse   Anxiety   Acute on chronic diastolic CHF (congestive heart failure) ([place])   LOS: 7 days   A & P   Cheresa Siers, DO [facility] Direct contact: see [email or web address]  7PM-7AM contact night coverage as  above 12/30/2019, 8:58 PM  LOS: 7 days  Acute hypoxic hypercarbic respiratory failure Thought to be initially set off by sedation versus acute on chronic diastolic CHF. Now off sedation Not on oxygen supplementation at baseline Currently requiring 3 L to maintain O2 saturation greater than 92%. Continue bronchodilators and pulmonary toilet Personally reviewed chest x-ray done on 12/25/2019 which showed bibasilar pulmonary infiltrates and bilateral pleural effusion Continue diuretics  Acute on chronic diastolic CHF Elevated BNP greater than 800 Continue IV diuretics Lasix 20 mg twice daily, will discharge to control blood pressures Continue strict I's and O's and daily weight Net I&O +2.6L Continue to monitor electrolytes while on diuretics, urine output, and blood pressures  Acute metabolic encephalopathy secondary to acute hypercarbic/hypoxic respiratory failure Improving Reorient as needed Fall precautions  Acute cholecystitis/choledocholithiasis Status post ERCP with stent placement. On IV Unasyn Blood cultures x2 - to date. Will need to follow-up with general surgery outpatient for possible lap chole once breathing is stabilized  Bipolar disorder Continue home medications  Alcohol use disorder Stable at this time No signs of withdrawal at this time  Chronic anxiety Continue home medications   Hypokalemia Potassium 2.9 Repleted  Hypomagnesemia Magnesium 1.6 Repleted  I have seen and examined this patient myself. I have spent 34 minutes in her evaluation and care.  DVT prophylaxis: Lovenox Code Status:Full code Family Communication:Declined for me to call family Disposition Plan:Discharge once weaned off of oxygen

## 2019-12-30 NOTE — Progress Notes (Signed)
Called to patient's room, she is complaining of feeling congested and having difficulty breathing through her nose. Patient's O2 changed from 3L to 4L- O2 sats fluctuating 87-97%. MD notified, STAT chest Xray ordered along with Neo-synphrine nasal solution. Instructed patient to continue using IS. O2 at 95% on 4L. Will continue to monitor.

## 2019-12-30 NOTE — Final Consult Note (Addendum)
Consultant Final Sign-Off Note    Assessment/Final recommendations  Denise King is a 69 y.o. female followed by [facility] for cholecystitis.  She is doing well with her diet and we have recommended waiting for cholecystectomy until after her lungs have improved.     Wound care (if applicable): none   Diet at discharge: low fat   Activity at discharge: ad lib   Follow-up appointment:   With [facility] to discuss interval cholecystectomy  Pending results:  Unresulted Labs (From admission, onward)    Start     Ordered   12/30/19 [id number]  Basic metabolic panel  Daily,   R    Question:  Specimen collection method  Answer:  Lab=Lab collect   12/29/19 0930   12/30/19 0500  CBC  Daily,   R    Question:  Specimen collection method  Answer:  Lab=Lab collect   12/29/19 0930           Medication recommendations:   Other recommendations:    Thank you for allowing [place] to participate in the care of your patient!  Please consult [place] again if you have further needs for your patient.  Rosario Adie [id number] 8:06 AM    Subjective     Objective  Vital signs in last 24 hours: Temp:  [97.6 F (36.4 C)-98.1 F (36.7 C)] 97.9 F (36.6 C) (01/02 0534) Pulse Rate:  [62-70] 70 (01/02 0534) Resp:  [15-18] 15 (01/02 0534) BP: (102-138)/(54-76) 102/54 (01/02 0534) SpO2:  [93 %-98 %] 96 % (01/02 0534) Weight:  [58.4 kg] 58.4 kg (01/01 0839)  General: NAD Abd soft   Pertinent labs and Studies: Recent Labs    12/28/19 0336 12/29/19 0232 12/30/19 0611  WBC 6.3 6.4 7.1  HGB 13.7 14.3 14.7  HCT 42.6 45.3 47.4*   BMET Recent Labs    12/29/19 0232 12/30/19 0611  NA 140 142  K 2.9* 3.9  CL 88* 88*  CO2 38* 42*  GLUCOSE 81 92  BUN 6* <5*  CREATININE 0.74 0.70  CALCIUM 8.1* 8.1*   No results for input(s): LABURIN in the last 72 hours. Results for orders placed or performed during the hospital encounter of 12/23/19  SARS CORONAVIRUS 2 (TAT 6-24 HRS) Nasopharyngeal  Nasopharyngeal Swab     Status: None   Collection Time: 12/23/19  3:21 PM   Specimen: Nasopharyngeal Swab  Result Value Ref Range Status   SARS Coronavirus 2 NEGATIVE NEGATIVE Final    Comment: (NOTE) SARS-CoV-2 target nucleic acids are NOT DETECTED. The SARS-CoV-2 RNA is generally detectable in upper and lower respiratory specimens during the acute phase of infection. Negative results do not preclude SARS-CoV-2 infection, do not rule out co-infections with other pathogens, and should not be used as the sole basis for treatment or other patient management decisions. Negative results must be combined with clinical observations, patient history, and epidemiological information. The expected result is Negative. Fact Sheet for Patients: SugarRoll.be Fact Sheet for Healthcare Providers: https://www.woods-mathews.com/ This test is not yet approved or cleared by the [place] FDA and  has been authorized for detection and/or diagnosis of SARS-CoV-2 by FDA under an Emergency Use Authorization (EUA). This EUA will remain  in effect (meaning this test can be used) for the duration of the COVID-19 declaration under Section 56 4(b)(1) of the Act, 21 U.S.C. section 360bbb-3(b)(1), unless the authorization is terminated or revoked sooner. Performed at [facility], [place] [address], [place], [place] [address]   Surgical PCR  screen     Status: None   Collection Time: 12/25/19  2:12 PM   Specimen: Nasal Mucosa; Nasal Swab  Result Value Ref Range Status   MRSA, PCR NEGATIVE NEGATIVE Final   Staphylococcus aureus NEGATIVE NEGATIVE Final    Comment: (NOTE) The Xpert SA Assay (FDA approved for NASAL specimens in patients 30 years of age and older), is one component of a comprehensive surveillance program. It is not intended to diagnose infection nor to guide or monitor treatment. Performed at [facility], [place] [address], [place],  [place] [address]   MRSA PCR Screening     Status: None   Collection Time: 12/25/19  2:12 PM   Specimen: Nasal Mucosa; Nasopharyngeal  Result Value Ref Range Status   MRSA by PCR NEGATIVE NEGATIVE Final    Comment:        The GeneXpert MRSA Assay (FDA approved for NASAL specimens only), is one component of a comprehensive MRSA colonization surveillance program. It is not intended to diagnose MRSA infection nor to guide or monitor treatment for MRSA infections. Performed at [facility], [place] [address] [place], [place] [address]   Culture, blood (routine x 2)     Status: None (Preliminary result)   Collection Time: 12/25/19  3:26 PM   Specimen: BLOOD RIGHT HAND  Result Value Ref Range Status   Specimen Description BLOOD RIGHT HAND  Final   Special Requests   Final    BOTTLES DRAWN AEROBIC AND ANAEROBIC Blood Culture results may not be optimal due to an inadequate volume of blood received in culture bottles   Culture   Final    NO GROWTH 4 DAYS Performed at [facility], [place] [address], [place], [place] [address]    Report Status PENDING  Incomplete  Culture, blood (routine x 2)     Status: None (Preliminary result)   Collection Time: 12/25/19  3:34 PM   Specimen: BLOOD LEFT ARM  Result Value Ref Range Status   Specimen Description BLOOD LEFT ARM LOWER  Final   Special Requests   Final    BOTTLES DRAWN AEROBIC AND ANAEROBIC Blood Culture results may not be optimal due to an inadequate volume of blood received in culture bottles   Culture   Final    NO GROWTH 4 DAYS Performed at [facility], [place] [address] [place], [place] [address]    Report Status PENDING  Incomplete    Imaging: No results found.

## 2019-12-31 LAB — BASIC METABOLIC PANEL
Anion gap: 9 (ref 5–15)
BUN: <5 mg/dL — ABNORMAL LOW (ref 8–23)
CO2: 39 mmol/L — ABNORMAL HIGH (ref 22–32)
Calcium: 8.4 mg/dL — ABNORMAL LOW (ref 8.9–10.3)
Chloride: 89 mmol/L — ABNORMAL LOW (ref 98–111)
Creatinine, Ser: 0.66 mg/dL (ref 0.44–1.00)
GFR calc Af Amer: >60 mL/min (ref >60)
GFR calc non Af Amer: >60 mL/min (ref >60)
Glucose, Bld: 95 mg/dL (ref 70–99)
Potassium: 3.7 mmol/L (ref 3.5–5.1)
Sodium: 137 mmol/L (ref 135–145)

## 2019-12-31 LAB — CBC
HCT: 44.7 % (ref 36.0–46.0)
Hemoglobin: 14.1 g/dL (ref 12.0–15.0)
MCH: 31.7 pg (ref 26.0–34.0)
MCHC: 31.5 g/dL (ref 30.0–36.0)
MCV: 100.4 fL — ABNORMAL HIGH (ref 80.0–100.0)
Platelets: 102 10*3/uL — ABNORMAL LOW (ref 150–400)
RBC: 4.45 MIL/uL (ref 3.87–5.11)
RDW: 12.5 % (ref 11.5–15.5)
WBC: 6 10*3/uL (ref 4.0–10.5)
nRBC: 0 % (ref 0.0–0.2)

## 2019-12-31 NOTE — Progress Notes (Signed)
PROGRESS NOTE  Denise King [id number] DOB: [birth year] DOA: 12/23/2019 PCP: Biagio Borg, MD  Brief History   69 year old female with past medical history of anxiety, alcohol abuse, bipolar disorder, hypertension and chronic pain syndrome/on chronic narcotics presented to the emergency room on 12/26 with complaints of nausea and vomiting and abdominal pain who was admitted for acute cholecystitis/common bile duct stones. General surgery and GI were consulted and patient went for ERCP on 12/28. Postprocedure, she became hypoxic and hypercarbic and transferred to the ICU on BiPAP. She was able to be weaned off the BiPAP and is currently on 4 L nasal cannula.Surgery has been following up and plans to do cholecystectomy laparoscopically as outpatient following stabilization for breathing.  Consultants  . General Surgery . PCCM . Gastroenterology  Procedures  . None  Antibiotics   Anti-infectives (From admission, onward)   Start     Dose/Rate Route Frequency Ordered Stop   12/25/19 0930  Ampicillin-Sulbactam (UNASYN) 3 g in sodium chloride 0.9 % 100 mL IVPB     3 g 200 mL/hr over 30 Minutes Intravenous  Once 12/25/19 0928 12/25/19 0958   12/23/19 2115  Ampicillin-Sulbactam (UNASYN) 3 g in sodium chloride 0.9 % 100 mL IVPB     3 g 200 mL/hr over 30 Minutes Intravenous Every 6 hours 12/23/19 2100     12/23/19 2103  valACYclovir (VALTREX) tablet 500 mg     500 mg Oral 2 times daily PRN 12/23/19 2104        Subjective  The patient is resting comfortably. No new complaints.   Objective   Vitals:  Vitals:   12/31/19 0743 12/31/19 1447  BP:  125/66  Pulse:  67  Resp:  20  Temp:  98.4 F (36.9 C)  SpO2: 94% 94%   Exam:  Constitutional:  . The patient is awake, alert, and oriented x 3. No acute distress. Respiratory:  . No increased work of breathing. . No wheezes, rales, or rhonchi . No tactile fremitus Cardiovascular:  . Regular rate and rhythm . No  murmurs, ectopy, or gallups. . No lateral PMI. No thrills. Abdomen:  . Abdomen is soft, non-tender, non-distended . No hernias, masses, or organomegaly . Normoactive bowel sounds.  Musculoskeletal:  . No cyanosis, clubbing, or edema Skin:  . No rashes, lesions, ulcers . palpation of skin: no induration or nodules Neurologic:  . CN 2-12 intact . Sensation all 4 extremities intact Psychiatric:  . Mental status o Mood, affect appropriate o Orientation to person, place, time  . judgment and insight appear intact  I have personally reviewed the following:   Today's Data  . Vitals, bmp, cbc  Imaging  . CXR  Scheduled Meds: . ARIPiprazole  2 mg Oral QHS  . busPIRone  10 mg Oral TID  . chlorhexidine  15 mL Mouth Rinse BID  . Chlorhexidine Gluconate Cloth  6 each Topical Daily  . cholecalciferol  2,000 Units Oral QPC lunch  . enoxaparin (LOVENOX) injection  40 mg Subcutaneous QHS  . folic acid  1 mg Oral QPC lunch  . furosemide  20 mg Intravenous Q12H  . gabapentin  300 mg Oral TID  . indomethacin  100 mg Rectal Once  . mouth rinse  15 mL Mouth Rinse q12n4p  . morphine  60 mg Oral Q12H  . nicotine  21 mg Transdermal Daily  . pantoprazole  40 mg Oral QHS  . rosuvastatin  5 mg Oral Q M,W,F-1800  . sertraline  200 mg  Oral QHS  . vitamin B-12  100 mcg Oral QPC lunch   Continuous Infusions: . ampicillin-sulbactam (UNASYN) IV 3 g (12/31/19 1553)    Principal Problem:   Acute respiratory failure with hypoxia and hypercapnia (HCC) Active Problems:   CIGARETTE SMOKER   Chronic pain syndrome   Abdominal pain   Nausea & vomiting   Common bile duct stone   Acute cholecystitis   Abnormal MRI of abdomen   Cholelithiasis   Bipolar disorder (HCC)   Alcohol abuse   Anxiety   Acute on chronic diastolic CHF (congestive heart failure) ([place])   LOS: 8 days   A & P   Hinata Diener, DO [facility] Direct contact: see [email or web address]  7PM-7AM contact night coverage as  above 12/30/2019, 8:58 PM  LOS: 7 days  Acute hypoxic hypercarbic respiratory failure: Due to acute on chronic diastolic CHF. She is not on oxygen supplementation at baseline.Currently requiring 2.5 L to maintain O2 saturation greater than 92%. Continue bronchodilators and pulmonary toilet. Personally reviewed chest x-ray done on 12/25/2019 which showed bibasilar pulmonary infiltrates and bilateral pleural effusion. Continue diuretics and   Acute on chronic diastolic CHF: Elevated BNP greater than 800.Continue IV diuretics Lasix 20 mg twice daily, will discharge to control blood pressures. Continue strict I's and O's and daily weight. Net I&O - 2.465L Continue to monitor electrolytes while on diuretics, urine output, and blood pressures.  Acute metabolic encephalopathy secondary to acute hypercarbic/hypoxic respiratory failure: Resolved. Pt has evaluated the patient and recommended Home health PT.   Acute cholecystitis/choledocholithiasis: Status post ERCP with stent placement. She is receiving IV Unasyn. Blood cultures x2 no growth to date. The patient will need to follow-up with general surgery outpatient for possible lap chole once breathing is stabilized.  Bipolar disorder: Continue home medications.  Alcohol use disorder: Stable at this time. No signs of withdrawal.  Chronic anxiety: Continue home medications.  Hypokalemia: Resolved. Monitor and supplement.  Hypomagnesemia: Resolved. Monitor.   I have seen and examined this patient myself. I have spent 34 minutes in her evaluation and care.  DVT prophylaxis: Lovenox Code Status:Full code Family Communication:Declined for me to call family Disposition Plan:Discharge once weaned off of oxygen

## 2019-12-31 NOTE — Progress Notes (Signed)
Received call from patient's daughter Benjamine Mola, she has several questions regarding mother's care. Paged Dr. Benny Lennert to notify her of daughters request for a call from MD. Dr. Benny Lennert will call family in the morning.

## 2020-01-01 DIAGNOSIS — E440 Moderate protein-calorie malnutrition: Secondary | ICD-10-CM | POA: Insufficient documentation

## 2020-01-01 LAB — CBC
HCT: 45 % (ref 36.0–46.0)
Hemoglobin: 14.4 g/dL (ref 12.0–15.0)
MCH: 31.7 pg (ref 26.0–34.0)
MCHC: 32 g/dL (ref 30.0–36.0)
MCV: 99.1 fL (ref 80.0–100.0)
Platelets: 107 10*3/uL — ABNORMAL LOW (ref 150–400)
RBC: 4.54 MIL/uL (ref 3.87–5.11)
RDW: 12.5 % (ref 11.5–15.5)
WBC: 6 10*3/uL (ref 4.0–10.5)
nRBC: 0 % (ref 0.0–0.2)

## 2020-01-01 LAB — BASIC METABOLIC PANEL
Anion gap: 10 (ref 5–15)
BUN: 6 mg/dL — ABNORMAL LOW (ref 8–23)
CO2: 39 mmol/L — ABNORMAL HIGH (ref 22–32)
Calcium: 8.7 mg/dL — ABNORMAL LOW (ref 8.9–10.3)
Chloride: 89 mmol/L — ABNORMAL LOW (ref 98–111)
Creatinine, Ser: 0.83 mg/dL (ref 0.44–1.00)
GFR calc Af Amer: >60 mL/min (ref >60)
GFR calc non Af Amer: >60 mL/min (ref >60)
Glucose, Bld: 92 mg/dL (ref 70–99)
Potassium: 3.5 mmol/L (ref 3.5–5.1)
Sodium: 138 mmol/L (ref 135–145)

## 2020-01-01 MED ORDER — ADULT MULTIVITAMIN W/MINERALS CH
1.0000 | ORAL_TABLET | Freq: Every day | ORAL | Status: DC
  Administered 2020-01-01 – 2020-01-08 (×8): 1 via ORAL
  Filled 2020-01-01 (×8): qty 1

## 2020-01-01 MED ORDER — ENSURE ENLIVE PO LIQD
237.0000 mL | Freq: Two times a day (BID) | ORAL | Status: DC
  Administered 2020-01-01 – 2020-01-02 (×2): 237 mL via ORAL

## 2020-01-01 NOTE — Progress Notes (Signed)
Physical Therapy Treatment Patient Details Name: Denise King MRN: [id number] DOB: 06-16-1951 Today's Date: 01/01/2020    History of Present Illness 69 year old Caucasian female with past medical history significant for irritable bowel syndrome, chronic pain syndrome, hypertension, GERD, alcohol, diverticulosis, anxiety and anemia.  Patient was admitted with gallstone cholecystitis. Pt underwent ERCP on 12/28 with subsequent hypercarbic resp failure.    PT Comments    Pt reports her spouse is in the ICU. She demonstrates improved mobility and breathing this session. SpO2 90-92% throughout on 2L [place]. Pt able to progress to extended hall ambulation and perform stairs. Pt encouraged to walk with nursing daily as well. D/C plan appropriate and pt educated for HEP and progression.     Follow Up Recommendations  Home health PT;Supervision/Assistance - 24 hour     Equipment Recommendations  None recommended by PT    Recommendations for Other Services       Precautions / Restrictions Precautions Precautions: Fall    Mobility  Bed Mobility Overal bed mobility: Modified Independent Bed Mobility: Supine to Sit           General bed mobility comments: HOB elevated 35 degrees with use of rail  Transfers Overall transfer level: Needs assistance   Transfers: Sit to/from Stand Sit to Stand: Supervision         General transfer comment: supervision for lines  Ambulation/Gait Ambulation/Gait assistance: Min guard Gait Distance (Feet): 200 Feet Assistive device: Rolling walker (2 wheeled) Gait Pattern/deviations: Step-through pattern;Trunk flexed;Decreased stride length   Gait velocity interpretation: 1.31 - 2.62 ft/sec, indicative of limited community ambulator General Gait Details: pt with cues to correct posture as she has scoliosis with kyphotic posture superimposed and is able to partially correct with cueing. Slow cautious gait with use of RW   Stairs Stairs:  Yes Stairs assistance: Min guard Stair Management: Step to pattern;Sideways;Forwards;One rail Left Number of Stairs: 3 General stair comments: pt ascended stairs sideways with left rail and descended forward. Guarding for safety and lines with pt with good stability with stairs with use of rail and 0/4 DOE   Wheelchair Mobility    Modified Rankin (Stroke Patients Only)       Balance Overall balance assessment: Needs assistance   Sitting balance-Leahy Scale: Good     Standing balance support: Bilateral upper extremity supported Standing balance-Leahy Scale: Fair Standing balance comment: bil Ue support on RW                            Cognition Arousal/Alertness: Awake/alert Behavior During Therapy: WFL for tasks assessed/performed Overall Cognitive Status: Within Functional Limits for tasks assessed                                        Exercises General Exercises - Lower Extremity Long Arc Quad: AROM;Both;15 reps;Seated Hip Flexion/Marching: AROM;Both;15 reps;Seated    General Comments        Pertinent Vitals/Pain Faces Pain Scale: Hurts little more Pain Location: back Pain Descriptors / Indicators: Aching Pain Intervention(s): Limited activity within patient's tolerance;Monitored during session;Repositioned    Home Living                      Prior Function            PT Goals (current goals can now be found in the care plan  section) Progress towards PT goals: Progressing toward goals    Frequency    Min 3X/week      PT Plan Current plan remains appropriate    Co-evaluation              AM-PAC PT "6 Clicks" Mobility   Outcome Measure  Help needed turning from your back to your side while in a flat bed without using bedrails?: A Little Help needed moving from lying on your back to sitting on the side of a flat bed without using bedrails?: A Little Help needed moving to and from a bed to a chair  (including a wheelchair)?: A Little Help needed standing up from a chair using your arms (e.g., wheelchair or bedside chair)?: A Little Help needed to walk in hospital room?: A Little Help needed climbing 3-5 steps with a railing? : A Little 6 Click Score: 18    End of Session Equipment Utilized During Treatment: Oxygen;Gait belt Activity Tolerance: Patient tolerated treatment well Patient left: in chair;with call bell/phone within reach;with nursing/sitter in room Nurse Communication: Mobility status PT Visit Diagnosis: Muscle weakness (generalized) (M62.81);Other abnormalities of gait and mobility (R26.89)     Time: [id number] PT Time Calculation (min) (ACUTE ONLY): 25 min  Charges:  $Gait Training: 8-22 mins $Therapeutic Activity: 8-22 mins                     Pratik Dalziel P, PT Acute Rehabilitation Services Pager: [phone] Office: [place] 01/01/2020, 1:36 PM

## 2020-01-01 NOTE — Progress Notes (Signed)
Patient resting comfortably on 2L [place] with no respiratory distress noted. BIPAP not needed at this time. RT will monitor as needed. 

## 2020-01-01 NOTE — Progress Notes (Signed)
Initial Nutrition Assessment  DOCUMENTATION CODES:   Non-severe (moderate) malnutrition in context of chronic illness  INTERVENTION:   -Ensure Enlive po BID, each supplement provides 350 kcal and 20 grams of protein -MVI with minerals daily -Magic cup BID with meals, each supplement provides 290 kcal and 9 grams of protein  NUTRITION DIAGNOSIS:   Moderate Malnutrition related to chronic illness([name]) as evidenced by energy intake < or equal to 75% for > or equal to 1 month, mild fat depletion, moderate fat depletion, mild muscle depletion, moderate muscle depletion.  GOAL:   Patient will meet greater than or equal to 90% of their needs  MONITOR:   Supplement acceptance, PO intake, Labs, Weight trends, Skin, I & O's  REASON FOR ASSESSMENT:   Malnutrition Screening Tool    ASSESSMENT:   Denise King is a 69 y.o. female with medical history significant of chronic pain syndrome, [name].  Pt admitted with nausea, vomiting, and acute cholecystitis.   12/28- s/p ERCP- revealed mildly dilated common bile duct  Reviewed I/O's: -2.1 L x 24 hours and -3.5 L since admission  UOP: 2.6 L x 24 hours  Spoke with pt at bedside, who was in good spirits today. She reports that her appetite is gradually getting better. Observed breakfast meal tray- pt consumed about 60% of tray. Pt working on lunch- consumed 50% of soup, 50% of ice cream, and 75% of sandwich. Documented meal completion 50-95%/  Pt reports very poor appetite for 4 months PTA, which declined fairly quickly over the past time. Pt shares that she was experiencing constant diarrhea with eating, as well as abdominal pain after eating. She was offered 3 meals per day, but often consumed only bites and sips of these meals. The majority of intake was comprised of water and Pepsi. Due to poor oral intake, pt complains of weight loss and muscle atrophy. Her UBW is around 120-125# and she estimates losing about 20-30# within the past 4  months. Per documented wt hx, pt has experienced a 2% wt loss over the past 6 months, which is not significant for time frame.   Pt also expresses concern over weakness and support at home (pt's husband is currently hospitalized here in the ICU and daughter lives closeby but unable to stay with her around the clock). Discussed importance of good nutrition to promote healing. She is amenable to oral nutrition supplements. Provided pt with encouragement; she reports her biggest goal is to regain enough strength to maintain her garden and greenhouse.   Labs reviewed: CBGS: 125.  NUTRITION - FOCUSED PHYSICAL EXAM:    Most Recent Value  Orbital Region  Moderate depletion  Upper Arm Region  Mild depletion  Thoracic and Lumbar Region  No depletion  Buccal Region  Mild depletion  Temple Region  Mild depletion  Clavicle Bone Region  Moderate depletion  Clavicle and Acromion Bone Region  Moderate depletion  Scapular Bone Region  Moderate depletion  Dorsal Hand  Moderate depletion  Patellar Region  Mild depletion  Anterior Thigh Region  Mild depletion  Posterior Calf Region  Mild depletion  Edema (RD Assessment)  None  Hair  Reviewed  Eyes  Reviewed  Mouth  Reviewed  Skin  Reviewed  Nails  Reviewed       Diet Order:   Diet Order            DIET SOFT Room service appropriate? Yes; Fluid consistency: Thin  Diet effective now  EDUCATION NEEDS:   Education needs have been addressed  Skin:  Skin Assessment: Reviewed RN Assessment  Last BM:  12/31/19  Height:   Ht Readings from Last 1 Encounters:  12/23/19 5\' 3"  (1.6 m)    Weight:   Wt Readings from Last 1 Encounters:  01/01/20 51.8 kg    Ideal Body Weight:  52.3 kg  BMI:  Body mass index is 20.23 kg/m.  Estimated Nutritional Needs:   Kcal:  1600-1800  Protein:  80-95 grams  Fluid:  > 1.6 L    Eulogia Dismore A. Jimmye Norman, RD, LDN, [place] Registered Dietitian II Certified Diabetes Care and Education  Specialist Pager: [phone] After hours Pager: [phone]

## 2020-01-01 NOTE — Progress Notes (Signed)
Attempted to reach the patient's daughter, Benjamine Mola as requested yesterday evening at 1830 twice. Received message that "this phone call cannot be completed at this time" x2. Will continue to attempt to reach patient's daughter as requested.

## 2020-01-01 NOTE — Plan of Care (Signed)
  Problem: Education: Goal: Knowledge of General Education information will improve Description: Including pain rating scale, medication(s)/side effects and non-pharmacologic comfort measures Outcome: Progressing   Problem: Activity: Goal: Risk for activity intolerance will decrease Outcome: Progressing   Problem: Nutrition: Goal: Adequate nutrition will be maintained Outcome: Progressing   Problem: Elimination: Goal: Will not experience complications related to bowel motility Outcome: Progressing Goal: Will not experience complications related to urinary retention Outcome: Progressing   Problem: Pain Managment: Goal: General experience of comfort will improve Outcome: Progressing   

## 2020-01-01 NOTE — Progress Notes (Signed)
PROGRESS NOTE  Denise King [id number] DOB: 07-16-1951 DOA: 12/23/2019 PCP: Biagio Borg, MD  Brief History   69 year old female with past medical history of anxiety, alcohol abuse, bipolar disorder, hypertension and chronic pain syndrome/on chronic narcotics presented to the emergency room on 12/26 with complaints of nausea and vomiting and abdominal pain who was admitted for acute cholecystitis/common bile duct stones. General surgery and GI were consulted and patient went for ERCP on 12/28. Postprocedure, she became hypoxic and hypercarbic and transferred to the ICU on BiPAP. She was able to be weaned off the BiPAP and is currently on 4 L nasal cannula.Surgery has been following up and plans to do cholecystectomy laparoscopically as outpatient following stabilization for breathing.  Consultants  . General Surgery . PCCM . Gastroenterology  Procedures  . None  Antibiotics   Anti-infectives (From admission, onward)   Start     Dose/Rate Route Frequency Ordered Stop   12/25/19 0930  Ampicillin-Sulbactam (UNASYN) 3 g in sodium chloride 0.9 % 100 mL IVPB     3 g 200 mL/hr over 30 Minutes Intravenous  Once 12/25/19 0928 12/25/19 0958   12/23/19 2115  Ampicillin-Sulbactam (UNASYN) 3 g in sodium chloride 0.9 % 100 mL IVPB     3 g 200 mL/hr over 30 Minutes Intravenous Every 6 hours 12/23/19 2100     12/23/19 2103  valACYclovir (VALTREX) tablet 500 mg     500 mg Oral 2 times daily PRN 12/23/19 2104        Subjective  The patient is resting comfortably. No new complaints.   Objective   Vitals:  Vitals:   01/01/20 0527 01/01/20 1526  BP: 107/60 (!) 100/58  Pulse: 61 68  Resp: 18 18  Temp: (!) 97.4 F (36.3 C) 97.9 F (36.6 C)  SpO2: 94% 96%   Exam:  Constitutional:  . The patient is awake, alert, and oriented x 3. No acute distress. Respiratory:  . No increased work of breathing. . No wheezes, rales, or rhonchi . No tactile fremitus Cardiovascular:   . Regular rate and rhythm . No murmurs, ectopy, or gallups. . No lateral PMI. No thrills. Abdomen:  . Abdomen is soft, non-tender, non-distended . No hernias, masses, or organomegaly . Normoactive bowel sounds.  Musculoskeletal:  . No cyanosis, clubbing, or edema Skin:  . No rashes, lesions, ulcers . palpation of skin: no induration or nodules Neurologic:  . CN 2-12 intact . Sensation all 4 extremities intact Psychiatric:  . Mental status o Mood, affect appropriate o Orientation to person, place, time  . judgment and insight appear intact  I have personally reviewed the following:   Today's Data  . Vitals, bmp, cbc  Imaging  . CXR  Scheduled Meds: . ARIPiprazole  2 mg Oral QHS  . busPIRone  10 mg Oral TID  . chlorhexidine  15 mL Mouth Rinse BID  . Chlorhexidine Gluconate Cloth  6 each Topical Daily  . cholecalciferol  2,000 Units Oral QPC lunch  . enoxaparin (LOVENOX) injection  40 mg Subcutaneous QHS  . feeding supplement (ENSURE ENLIVE)  237 mL Oral BID BM  . folic acid  1 mg Oral QPC lunch  . furosemide  20 mg Intravenous Q12H  . gabapentin  300 mg Oral TID  . mouth rinse  15 mL Mouth Rinse q12n4p  . morphine  60 mg Oral Q12H  . multivitamin with minerals  1 tablet Oral Daily  . nicotine  21 mg Transdermal Daily  . pantoprazole  40 mg Oral QHS  . rosuvastatin  5 mg Oral Q M,W,F-1800  . sertraline  200 mg Oral QHS  . vitamin B-12  100 mcg Oral QPC lunch   Continuous Infusions: . ampicillin-sulbactam (UNASYN) IV 3 g (01/01/20 1636)    Principal Problem:   Acute respiratory failure with hypoxia and hypercapnia (HCC) Active Problems:   CIGARETTE SMOKER   Chronic pain syndrome   Abdominal pain   Nausea & vomiting   Common bile duct stone   Acute cholecystitis   Abnormal MRI of abdomen   Cholelithiasis   Bipolar disorder (HCC)   Alcohol abuse   Anxiety   Acute on chronic diastolic CHF (congestive heart failure) (HCC)   Malnutrition of moderate  degree   LOS: 9 days   A & P    Acute hypoxic hypercarbic respiratory failure: Due to acute on chronic diastolic CHF. She is not on oxygen supplementation at baseline.Currently requiring 2.5 L to maintain O2 saturation greater than 92%. Continue bronchodilators and pulmonary toilet. Personally reviewed chest x-ray done on 12/25/2019 which showed bibasilar pulmonary infiltrates and bilateral pleural effusion. Continue diuretics and to wean O2 down.   Acute on chronic diastolic CHF: Elevated BNP greater than 800.Continue IV diuretics Lasix 20 mg twice daily, will discharge to control blood pressures. Continue strict I's and O's and daily weight. Net I&O - 3.535.7 L. 0200 am dose of lasix has been discontinued to allow for better sleep. Continue to monitor electrolytes while on diuretics, urine output, and blood pressures.  Acute metabolic encephalopathy secondary to acute hypercarbic/hypoxic respiratory failure: Resolved. Pt has evaluated the patient and recommended Home health PT.   Acute cholecystitis/choledocholithiasis: Status post ERCP with stent placement. She is receiving IV Unasyn. Blood cultures x2 no growth to date. The patient will need to follow-up with general surgery outpatient for possible lap chole once breathing is stabilized.  Bipolar disorder: Continue home medications.  Alcohol use disorder: Stable at this time. No signs of withdrawal.  Chronic anxiety: Continue home medications.  Hypokalemia: Resolved. Monitor and supplement.  Hypomagnesemia: Resolved. Monitor.   I have seen and examined this patient myself. I have spent 32 minutes in her evaluation and care.  DVT prophylaxis: Lovenox Code Status:Full code Family Communication:I have attempted to reach the patient's daughter, Benjamine Mola twice, but received a message stating that my call could not be completed at that time.  Disposition Plan:Discharge once weaned off of oxygen or it is determined that the  patient will require O2 at home.  Collen Hostler, DO [facility] Direct contact: see [email or web address]  7PM-7AM contact night coverage as above 01/01/2020, 5:12 PM  LOS: 7 days

## 2020-01-02 LAB — URINALYSIS, ROUTINE W REFLEX MICROSCOPIC
Bilirubin Urine: NEGATIVE
Glucose, UA: NEGATIVE mg/dL
Hgb urine dipstick: NEGATIVE
Ketones, ur: NEGATIVE mg/dL
Leukocytes,Ua: NEGATIVE
Nitrite: NEGATIVE
Protein, ur: NEGATIVE mg/dL
Specific Gravity, Urine: 1.011 (ref 1.005–1.030)
pH: 8 (ref 5.0–8.0)

## 2020-01-02 NOTE — Progress Notes (Signed)
PROGRESS NOTE  Denise King [id number] DOB: [birth year] DOA: 12/23/2019 PCP: Biagio Borg, MD  Brief History   69 year old female with past medical history of anxiety, alcohol abuse, bipolar disorder, hypertension and chronic pain syndrome/on chronic narcotics presented to the emergency room on 12/26 with complaints of nausea and vomiting and abdominal pain who was admitted for acute cholecystitis/common bile duct stones. General surgery and GI were consulted and patient went for ERCP on 12/28. Postprocedure, she became hypoxic and hypercarbic and transferred to the ICU on BiPAP. She was able to be weaned off the BiPAP and is currently on 4 L nasal cannula.Surgery has been following up and plans to do cholecystectomy laparoscopically as outpatient following stabilization for breathing.  The patient is slowly being weaned off of supplementary O2 as possible. However, her saturations have been noted to drop with activity even with O2 on. She is likely to require O2 at home and has chronic hypoxic respiratory failure.  Consultants  . General Surgery . PCCM . Gastroenterology  Procedures  . None  Antibiotics   Anti-infectives (From admission, onward)   Start     Dose/Rate Route Frequency Ordered Stop   12/25/19 0930  Ampicillin-Sulbactam (UNASYN) 3 g in sodium chloride 0.9 % 100 mL IVPB     3 g 200 mL/hr over 30 Minutes Intravenous  Once 12/25/19 0928 12/25/19 0958   12/23/19 2115  Ampicillin-Sulbactam (UNASYN) 3 g in sodium chloride 0.9 % 100 mL IVPB     3 g 200 mL/hr over 30 Minutes Intravenous Every 6 hours 12/23/19 2100     12/23/19 2103  valACYclovir (VALTREX) tablet 500 mg     500 mg Oral 2 times daily PRN 12/23/19 2104        Subjective  The patient is resting comfortably. No new complaints.   Objective   Vitals:  Vitals:   01/02/20 0411 01/02/20 1454  BP: 112/62 110/70  Pulse: 67 67  Resp: 19 20  Temp: 97.9 F (36.6 C) 98.5 F (36.9 C)  SpO2: 94% 95%    Exam:  Constitutional:  . The patient is awake, alert, and oriented x 3. No acute distress. Respiratory:  . No increased work of breathing. . No wheezes, rales, or rhonchi . No tactile fremitus Cardiovascular:  . Regular rate and rhythm . No murmurs, ectopy, or gallups. . No lateral PMI. No thrills. Abdomen:  . Abdomen is soft, non-tender, non-distended . No hernias, masses, or organomegaly . Normoactive bowel sounds.  Musculoskeletal:  . No cyanosis, clubbing, or edema Skin:  . No rashes, lesions, ulcers . palpation of skin: no induration or nodules Neurologic:  . CN 2-12 intact . Sensation all 4 extremities intact Psychiatric:  . Mental status o Mood, affect appropriate o Orientation to person, place, time  . judgment and insight appear intact  I have personally reviewed the following:   Today's Data  . Vitals, bmp, cbc  Imaging  . CXR  Scheduled Meds: . ARIPiprazole  2 mg Oral QHS  . busPIRone  10 mg Oral TID  . chlorhexidine  15 mL Mouth Rinse BID  . Chlorhexidine Gluconate Cloth  6 each Topical Daily  . cholecalciferol  2,000 Units Oral QPC lunch  . enoxaparin (LOVENOX) injection  40 mg Subcutaneous QHS  . feeding supplement (ENSURE ENLIVE)  237 mL Oral BID BM  . folic acid  1 mg Oral QPC lunch  . furosemide  20 mg Intravenous Q12H  . gabapentin  300 mg Oral TID  .  mouth rinse  15 mL Mouth Rinse q12n4p  . morphine  60 mg Oral Q12H  . multivitamin with minerals  1 tablet Oral Daily  . nicotine  21 mg Transdermal Daily  . pantoprazole  40 mg Oral QHS  . rosuvastatin  5 mg Oral Q M,W,F-1800  . sertraline  200 mg Oral QHS  . vitamin B-12  100 mcg Oral QPC lunch   Continuous Infusions: . ampicillin-sulbactam (UNASYN) IV 3 g (01/02/20 1634)    Principal Problem:   Acute respiratory failure with hypoxia and hypercapnia (HCC) Active Problems:   CIGARETTE SMOKER   Chronic pain syndrome   Abdominal pain   Nausea & vomiting   Common bile duct stone    Acute cholecystitis   Abnormal MRI of abdomen   Cholelithiasis   Bipolar disorder (HCC)   Alcohol abuse   Anxiety   Acute on chronic diastolic CHF (congestive heart failure) (HCC)   Malnutrition of moderate degree   LOS: 10 days   A & P    Acute hypoxic hypercarbic respiratory failure: Due to acute on chronic diastolic CHF. She is not on oxygen supplementation at baseline.Currently requiring 2.5 L to maintain O2 saturation greater than 92%. Continue bronchodilators and pulmonary toilet. Personally reviewed chest x-ray done on 12/25/2019 which showed bibasilar pulmonary infiltrates and bilateral pleural effusion. Continue diuretics and to wean O2 down. The patient is slowly being weaned off of supplementary O2 as possible. However, her saturations have been noted to drop with activity even with O2 on. She is likely to require O2 at home and has chronic hypoxic respiratory failure.  Acute on chronic diastolic CHF: Elevated BNP greater than 800.Continue IV diuretics Lasix 20 mg twice daily, will discharge to control blood pressures. Continue strict I's and O's and daily weight. Net I&O - 3.535.7 L. 0200 am dose of lasix has been discontinued to allow for better sleep. Continue to monitor electrolytes while on diuretics, urine output, and blood pressures.  Acute metabolic encephalopathy secondary to acute hypercarbic/hypoxic respiratory failure: Resolved. Pt has evaluated the patient and recommended Home health PT.   Acute cholecystitis/choledocholithiasis: Status post ERCP with stent placement. She is receiving IV Unasyn. Blood cultures x2 no growth to date. The patient will need to follow-up with general surgery outpatient for possible lap chole once breathing is stabilized.  Bipolar disorder: Continue home medications.  Alcohol use disorder: Stable at this time. No signs of withdrawal.  Chronic anxiety: Continue home medications.  Hypokalemia: Resolved. Monitor and  supplement.  Hypomagnesemia: Resolved. Monitor.   I have seen and examined this patient myself. I have spent 45 minutes in her evaluation and care. More than 50% of this was spent in counseling with the patient's daughter Benjamine Mola.  DVT prophylaxis: Lovenox Code Status:Full code Family Communication:I have discussed the patient in detail with her daugher, Benjamine Mola. All questions answered to the best of my ability.  Disposition Plan:Discharge once weaned off of oxygen or it is determined that the patient will require O2 at home. She will also require PT/OT.  Riverlyn Kizziah, DO [facility] Direct contact: see [email or web address]  7PM-7AM contact night coverage as above 01/02/2020, 6:02 PM  LOS: 7 days

## 2020-01-02 NOTE — Progress Notes (Signed)
SATURATION QUALIFICATIONS: (This note is used to comply with regulatory documentation for home oxygen)  Patient Saturations on Room Air at Rest = 92% on 2L  Patient Saturations on Room Air while Ambulating = 80%  Patient Saturations on 2 Liters of oxygen while Ambulating = 85%  Please briefly explain why patient needs home oxygen:  Patient's 02 level drops when she is walking with no oxygen

## 2020-01-02 NOTE — Progress Notes (Signed)
Patient resting comfortably on 2L [place] with no respiratory distress noted. BIPAP not needed at this time. RT will monitor as needed. 

## 2020-01-02 NOTE — Plan of Care (Signed)

## 2020-01-03 MED ORDER — BOOST / RESOURCE BREEZE PO LIQD CUSTOM
1.0000 | Freq: Three times a day (TID) | ORAL | Status: DC
  Administered 2020-01-03 – 2020-01-08 (×9): 1 via ORAL

## 2020-01-03 NOTE — Progress Notes (Signed)
Patient taken off oxygen at roughly 2pm. Patient has been maintaining resting 02 sats between 90%-93%.

## 2020-01-03 NOTE — Care Management Important Message (Signed)
Important Message  Patient Details  Name: Denise King MRN: [id number] Date of Birth: 1951-01-03   Medicare Important Message Given:  Yes     Shelda Altes 01/03/2020, 3:00 PM

## 2020-01-03 NOTE — Progress Notes (Signed)
PROGRESS NOTE  Denise King [id number] DOB: 1951/08/03 DOA: 12/23/2019 PCP: Biagio Borg, MD  Brief History   69 year old female with past medical history of anxiety, alcohol abuse, bipolar disorder, hypertension and chronic pain syndrome/on chronic narcotics presented to the emergency room on 12/26 with complaints of nausea and vomiting and abdominal pain who was admitted for acute cholecystitis/common bile duct stones. General surgery and GI were consulted and patient went for ERCP on 12/28. Postprocedure, she became hypoxic and hypercarbic and transferred to the ICU on BiPAP. She was able to be weaned off the BiPAP and is currently on 4 L nasal cannula.Surgery has been following up and plans to do cholecystectomy laparoscopically as outpatient following stabilization for breathing.  The patient is slowly being weaned off of supplementary O2 as possible. Today the patient is seen sitting in bed with O2 off. She is maintaining SaO2 in the 90's for the most part with occasional dips into the 80's. Will qualify for O2 with ambulation tomorrow before discharge.  Consultants  . General Surgery . PCCM . Gastroenterology  Procedures  . None  Antibiotics   Anti-infectives (From admission, onward)   Start     Dose/Rate Route Frequency Ordered Stop   12/25/19 0930  Ampicillin-Sulbactam (UNASYN) 3 g in sodium chloride 0.9 % 100 mL IVPB     3 g 200 mL/hr over 30 Minutes Intravenous  Once 12/25/19 0928 12/25/19 0958   12/23/19 2115  Ampicillin-Sulbactam (UNASYN) 3 g in sodium chloride 0.9 % 100 mL IVPB     3 g 200 mL/hr over 30 Minutes Intravenous Every 6 hours 12/23/19 2100     12/23/19 2103  valACYclovir (VALTREX) tablet 500 mg     500 mg Oral 2 times daily PRN 12/23/19 2104        Subjective  The patient is resting comfortably. No new complaints.   Objective   Vitals:  Vitals:   01/03/20 0539 01/03/20 1425  BP: 102/64 105/61  Pulse: 68 71  Resp: 14 18  Temp: 98.4  F (36.9 C) 99.1 F (37.3 C)  SpO2: 97% 95%   Exam:  Constitutional:  . The patient is awake, alert, and oriented x 3. No acute distress. Respiratory:  . No increased work of breathing. . No wheezes, rales, or rhonchi . No tactile fremitus Cardiovascular:  . Regular rate and rhythm . No murmurs, ectopy, or gallups. . No lateral PMI. No thrills. Abdomen:  . Abdomen is soft, non-tender, non-distended . No hernias, masses, or organomegaly . Normoactive bowel sounds.  Musculoskeletal:  . No cyanosis, clubbing, or edema Skin:  . No rashes, lesions, ulcers . palpation of skin: no induration or nodules Neurologic:  . CN 2-12 intact . Sensation all 4 extremities intact Psychiatric:  . Mental status o Mood, affect appropriate o Orientation to person, place, time  . judgment and insight appear intact  I have personally reviewed the following:   Today's Data  . Vitals, bmp, cbc  Imaging  . CXR  Scheduled Meds: . ARIPiprazole  2 mg Oral QHS  . busPIRone  10 mg Oral TID  . chlorhexidine  15 mL Mouth Rinse BID  . Chlorhexidine Gluconate Cloth  6 each Topical Daily  . cholecalciferol  2,000 Units Oral QPC lunch  . enoxaparin (LOVENOX) injection  40 mg Subcutaneous QHS  . feeding supplement  1 Container Oral TID BM  . folic acid  1 mg Oral QPC lunch  . furosemide  20 mg Intravenous Q12H  .  gabapentin  300 mg Oral TID  . mouth rinse  15 mL Mouth Rinse q12n4p  . morphine  60 mg Oral Q12H  . multivitamin with minerals  1 tablet Oral Daily  . nicotine  21 mg Transdermal Daily  . pantoprazole  40 mg Oral QHS  . rosuvastatin  5 mg Oral Q M,W,F-1800  . sertraline  200 mg Oral QHS  . vitamin B-12  100 mcg Oral QPC lunch   Continuous Infusions: . ampicillin-sulbactam (UNASYN) IV 3 g (01/03/20 1715)    Principal Problem:   Acute respiratory failure with hypoxia and hypercapnia (HCC) Active Problems:   CIGARETTE SMOKER   Chronic pain syndrome   Abdominal pain   Nausea &  vomiting   Common bile duct stone   Acute cholecystitis   Abnormal MRI of abdomen   Cholelithiasis   Bipolar disorder (HCC)   Alcohol abuse   Anxiety   Acute on chronic diastolic CHF (congestive heart failure) (HCC)   Malnutrition of moderate degree   LOS: 11 days   A & P    Acute hypoxic hypercarbic respiratory failure: Due to acute on chronic diastolic CHF. She is not on oxygen supplementation at baseline.Currently requiring 2.5 L to maintain O2 saturation greater than 92%. Continue bronchodilators and pulmonary toilet. Personally reviewed chest x-ray done on 12/25/2019 which showed bibasilar pulmonary infiltrates and bilateral pleural effusion. Continue diuretics and to wean O2 down. Today the patient is seen sitting in bed with O2 off. She is maintaining SaO2 in the 90's for the most part with occasional dips into the 80's. Will qualify for O2 with ambulation tomorrow before discharge.  Acute on chronic diastolic CHF: Elevated BNP greater than 800.Continue IV diuretics Lasix 20 mg twice daily, will discharge to control blood pressures. Continue strict I's and O's and daily weight. Net I&O - 3.535.7 L. 0200 am dose of lasix has been discontinued to allow for better sleep. Continue to monitor electrolytes while on diuretics, urine output, and blood pressures.  Acute metabolic encephalopathy secondary to acute hypercarbic/hypoxic respiratory failure: Resolved. Pt has evaluated the patient and recommended Home health PT.   Acute cholecystitis/choledocholithiasis: Status post ERCP with stent placement. She is receiving IV Unasyn. She will need to follow up with general surgery as outpatient when her respiratory situation has improved.  Blood cultures x2 no growth to date. The patient will need to follow-up with general surgery outpatient for possible lap chole once breathing is stabilized.  Bipolar disorder: Continue home medications.  Alcohol use disorder: Stable at this time. No signs  of withdrawal.  Chronic anxiety: Continue home medications.  Hypokalemia: Resolved. Monitor and supplement.  Hypomagnesemia: Resolved. Monitor.   I have seen and examined this patient myself. I have spent 34 minutes in her evaluation and care.   DVT prophylaxis: Lovenox Code Status:Full code Family Communication:I have discussed the patient in detail with her daugher, Benjamine Mola. All questions answered to the best of my ability.  Disposition Plan:Pt will be qualified for O2 with ambulation tomorrow. I anticipate dc to home with [facility] PT/OT.  Ephrata Verville, DO [facility] Direct contact: see [email or web address]  7PM-7AM contact night coverage as above 01/03/2020, 5:41 PM  LOS: 7 days

## 2020-01-03 NOTE — Progress Notes (Signed)
Nutrition Follow-up  RD working remotely.  DOCUMENTATION CODES:   Non-severe (moderate) malnutrition in context of chronic illness  INTERVENTION:   -Boost Breeze po TID, each supplement provides 250 kcal and 9 grams of protein -D/c Ensure Enlive po BID, each supplement provides 350 kcal and 20 grams of protein -Continue MVI with minerals daily -Continue Magic cup TID with meals, each supplement provides 290 kcal and 9 grams of protein  NUTRITION DIAGNOSIS:   Moderate Malnutrition related to chronic illness([name]) as evidenced by energy intake < or equal to 75% for > or equal to 1 month, mild fat depletion, moderate fat depletion, mild muscle depletion, moderate muscle depletion.  Ongoing  GOAL:   Patient will meet greater than or equal to 90% of their needs  Progressing   MONITOR:   Supplement acceptance, PO intake, Labs, Weight trends, Skin, I & O's  REASON FOR ASSESSMENT:   Malnutrition Screening Tool    ASSESSMENT:   Denise King is a 69 y.o. female with medical history significant of chronic pain syndrome, [name].  Reviewed I/O's: +40 ml x 24 hours and -2.8 L since admission  UOP: 800 ml x 24 hours   Pt remains with poor to fair appetite. Noted meal completion 25-50%. Pt is refusing Ensure supplements per MAR.   Medications reviewed and include lasix and folic acid.  Therapies recommending home health services at discharge.   Labs reviewed: CBGS: 125.   Diet Order:   Diet Order            DIET SOFT Room service appropriate? Yes; Fluid consistency: Thin  Diet effective now              EDUCATION NEEDS:   Education needs have been addressed  Skin:  Skin Assessment: Reviewed RN Assessment  Last BM:  01/02/19  Height:   Ht Readings from Last 1 Encounters:  12/23/19 5\' 3"  (1.6 m)    Weight:   Wt Readings from Last 1 Encounters:  01/03/20 52.9 kg    Ideal Body Weight:  52.3 kg  BMI:  Body mass index is 20.66 kg/m.  Estimated  Nutritional Needs:   Kcal:  1600-1800  Protein:  80-95 grams  Fluid:  > 1.6 L    Amri Lien A. Jimmye Norman, RD, LDN, [place] Registered Dietitian II Certified Diabetes Care and Education Specialist Pager: [phone] After hours Pager: [phone]

## 2020-01-03 NOTE — Progress Notes (Signed)
Physical Therapy Treatment Patient Details Name: Denise King MRN: [id number] DOB: January 21, 1951 Today's Date: 01/03/2020    History of Present Illness Pt is a 69 y/o female with past medical history significant for irritable bowel syndrome, chronic pain syndrome, hypertension, GERD, alcohol, diverticulosis, anxiety and anemia.  Patient was admitted with gallstone cholecystitis. Pt underwent ERCP on 12/28 with subsequent hypercarbic resp failure.    PT Comments    Pt making steady progress towards achieving her current functional mobility goals. She ambulated in hallway with min guard with use of RW on RA with SPO2 maintaining >92%. Pt would continue to benefit from skilled physical therapy services at this time while admitted and after d/c to address the below listed limitations in order to improve overall safety and independence with functional mobility.    Follow Up Recommendations  Home health PT;Supervision/Assistance - 24 hour     Equipment Recommendations  None recommended by PT    Recommendations for Other Services       Precautions / Restrictions Precautions Precautions: Fall Restrictions Weight Bearing Restrictions: No    Mobility  Bed Mobility Overal bed mobility: Modified Independent                Transfers Overall transfer level: Needs assistance Equipment used: Rolling walker (2 wheeled) Transfers: Sit to/from Stand Sit to Stand: Supervision         General transfer comment: supervision for safety, cueing for safe hand placement  Ambulation/Gait Ambulation/Gait assistance: Min guard Gait Distance (Feet): 75 Feet Assistive device: Rolling walker (2 wheeled) Gait Pattern/deviations: Step-through pattern;Trunk flexed;Decreased stride length Gait velocity: decreased   General Gait Details: pt overall with slow, cautious gait; pt with consisten R lateral lean and R lateral drift secondary to h/o scoliosis. Pt able to minimally self  correct   Stairs             Wheelchair Mobility    Modified Rankin (Stroke Patients Only)       Balance Overall balance assessment: Needs assistance Sitting-balance support: Feet supported;No upper extremity supported Sitting balance-Leahy Scale: Good     Standing balance support: Bilateral upper extremity supported Standing balance-Leahy Scale: Poor                              Cognition Arousal/Alertness: Awake/alert Behavior During Therapy: WFL for tasks assessed/performed Overall Cognitive Status: Within Functional Limits for tasks assessed                                        Exercises      General Comments        Pertinent Vitals/Pain Pain Assessment: Faces Faces Pain Scale: Hurts little more Pain Location: back Pain Descriptors / Indicators: Aching Pain Intervention(s): Monitored during session;Repositioned    Home Living                      Prior Function            PT Goals (current goals can now be found in the care plan section) Acute Rehab PT Goals PT Goal Formulation: With patient Time For Goal Achievement: 01/10/20 Potential to Achieve Goals: Good Progress towards PT goals: Progressing toward goals    Frequency    Min 3X/week      PT Plan Current plan remains appropriate    Co-evaluation  AM-PAC PT "6 Clicks" Mobility   Outcome Measure  Help needed turning from your back to your side while in a flat bed without using bedrails?: None Help needed moving from lying on your back to sitting on the side of a flat bed without using bedrails?: None Help needed moving to and from a bed to a chair (including a wheelchair)?: A Little Help needed standing up from a chair using your arms (e.g., wheelchair or bedside chair)?: None Help needed to walk in hospital room?: A Little Help needed climbing 3-5 steps with a railing? : A Little 6 Click Score: 21    End of Session  Equipment Utilized During Treatment: Gait belt Activity Tolerance: Patient tolerated treatment well Patient left: in bed;with call bell/phone within reach Nurse Communication: Mobility status PT Visit Diagnosis: Muscle weakness (generalized) (M62.81);Other abnormalities of gait and mobility (R26.89)     Time: [id number] PT Time Calculation (min) (ACUTE ONLY): 17 min  Charges:  $Gait Training: 8-22 mins                     Anastasio Champion, DPT  Acute Rehabilitation Services Pager [phone] Office [place] 01/03/2020, 2:08 PM

## 2020-01-03 NOTE — Plan of Care (Signed)

## 2020-01-03 NOTE — TOC Progression Note (Signed)
Transition of Care [facility]) - Progression Note    Patient Details  Name: MERDITH ADAN MRN: [id number] Date of Birth: 05/22/1951  Transition of Care [facility]) CM/SW [place], [place] Phone Number: 01/03/2020, 4:51 PM  Clinical Narrative:    CSW spoke with pt and pt daughter Benjamine Mola on speakerphone with pt phone Benjamine [phone]). CSW introduced self, role, reason for call. We discussed recommendations for [facility] PT and 24/7 assistance. Pt had met with Magdalen Spatz, RNCM earlier in admission who was able to arrange HHPT through [facility]. Pt daughters plan on assisting pt at home as pt husband is currently hospitalized and planning on going to SNF. Pt daughter open to speaking with [facility] liaison, CSW to make that contact on her behalf. Pt and pt daughter open to [place] to complete medications and bring to room when pt stable.   Pt daughter Benjamine Mola called CSW after bedside discussion. We discussed differences between CIR, SNF, and home health. Family feels they can manage pt at home as a family since their father will be going to SNF.   TOC team will continue to follow.    Expected Discharge Plan: [place] Barriers to Discharge: Continued Medical Work up  Expected Discharge Plan and Services Expected Discharge Plan: [place]   Discharge Planning Services: CM Consult Post Acute Care Choice: [place] arrangements for the past 2 months: Single Family Home             DME Arranged: N/A HH Arranged: PT HH Agency: [place] Date [place]: 12/27/19 Time [place]: [place] Representative spoke with at [place]: [place]   Readmission Risk Interventions No flowsheet data found.

## 2020-01-04 ENCOUNTER — Ambulatory Visit: Payer: Medicare HMO | Admitting: Physician Assistant

## 2020-01-04 MED ORDER — POTASSIUM CHLORIDE ER 10 MEQ PO TBCR: 10.0000 meq | EXTENDED_RELEASE_TABLET | Freq: Every day | ORAL | 0 refills | Status: DC

## 2020-01-04 MED ORDER — NICOTINE 21 MG/24HR TD PT24: 21.0000 mg | MEDICATED_PATCH | Freq: Every day | TRANSDERMAL | 0 refills | Status: AC

## 2020-01-04 MED ORDER — ADULT MULTIVITAMIN W/MINERALS CH: 1.0000 | ORAL_TABLET | Freq: Every day | ORAL | 0 refills | Status: DC

## 2020-01-04 MED ORDER — SODIUM CHLORIDE 0.9 % IV BOLUS
500.0000 mL | Freq: Once | INTRAVENOUS | Status: AC
  Administered 2020-01-04: 500 mL via INTRAVENOUS

## 2020-01-04 MED ORDER — FUROSEMIDE 20 MG PO TABS: 20.0000 mg | ORAL_TABLET | Freq: Every day | ORAL | 11 refills | Status: DC

## 2020-01-04 NOTE — Progress Notes (Signed)
After 500cc bolus, BP 99/46. Ava Swayze, DO paged. New orders to follow. Will continue to monitor.

## 2020-01-04 NOTE — Plan of Care (Signed)

## 2020-01-04 NOTE — TOC Progression Note (Addendum)
Transition of Care [facility]) - Progression Note    Patient Details  Name: ZALEY MATSUSHITA MRN: [id number] Date of Birth: [birth year]  Transition of Care [facility]) CM/SW Contact  Jacalyn Lefevre Edson Snowball, RN Phone Number: 01/04/2020, 1:55 PM  Clinical Narrative:    [id number] oxygen has been ordered. Patient for discharge today .Messaged  Cory with [employer] .   Order for home oxygen. Have requested updated ambulatory saturation note from nurse. Once received will order oxygen with Zack with [place].  Expected Discharge Plan: [place] Barriers to Discharge: Continued Medical Work up  Expected Discharge Plan and Services Expected Discharge Plan: [place]   Discharge Planning Services: CM Consult Post Acute Care Choice: [place] arrangements for the past 2 months: Single Family Home Expected Discharge Date: 01/04/20               DME Arranged: N/A         HH Arranged: PT HH Agency: [place] Date [place]: 12/27/19 Time [place]: [id number] Representative spoke with at [place]: [place] ([place]) Interventions    Readmission Risk Interventions No flowsheet data found.

## 2020-01-04 NOTE — Progress Notes (Signed)
PROGRESS NOTE  Denise King [id number] DOB: 11-18-1951 DOA: 12/23/2019 PCP: Biagio Borg, MD  Brief History   69 year old female with past medical history of anxiety, alcohol abuse, bipolar disorder, hypertension and chronic pain syndrome/on chronic narcotics presented to the emergency room on 12/26 with complaints of nausea and vomiting and abdominal pain who was admitted for acute cholecystitis/common bile duct stones. General surgery and GI were consulted and patient went for ERCP on 12/28. Postprocedure, she became hypoxic and hypercarbic and transferred to the ICU on BiPAP. She was able to be weaned off the BiPAP and is currently on 4 L nasal cannula.Surgery has been following up and plans to do cholecystectomy laparoscopically as outpatient following stabilization for breathing.  The patient is slowly being weaned off of supplementary O2 as possible. Today the patient is seen sitting in bed with O2 off. She is maintaining SaO2 in the 90's for the most part with occasional dips into the 80's. Will qualify for O2 with ambulation tomorrow before discharge.  Patient had hypotension that did not immediately respond to IV fluid bolus today prior to discharge. Discharge was held and she will be given another bolus of IV fluid.  Consultants  . General Surgery . PCCM . Gastroenterology  Procedures  . None  Antibiotics   Anti-infectives (From admission, onward)   Start     Dose/Rate Route Frequency Ordered Stop   12/25/19 0930  Ampicillin-Sulbactam (UNASYN) 3 g in sodium chloride 0.9 % 100 mL IVPB     3 g 200 mL/hr over 30 Minutes Intravenous  Once 12/25/19 0928 12/25/19 0958   12/23/19 2115  Ampicillin-Sulbactam (UNASYN) 3 g in sodium chloride 0.9 % 100 mL IVPB     3 g 200 mL/hr over 30 Minutes Intravenous Every 6 hours 12/23/19 2100     12/23/19 2103  valACYclovir (VALTREX) tablet 500 mg     500 mg Oral 2 times daily PRN 12/23/19 2104        Subjective  The patient  is resting comfortably. No new complaints.   Objective   Vitals:  Vitals:   01/04/20 1329 01/04/20 1600  BP: (!) 94/53 (!) 99/46  Pulse: 75   Resp: (!) 24   Temp: 98.7 F (37.1 C)   SpO2: 91%    Exam:  Constitutional:  . The patient is awake, alert, and oriented x 3. No acute distress. Respiratory:  . No increased work of breathing. . No wheezes, rales, or rhonchi . No tactile fremitus Cardiovascular:  . Regular rate and rhythm . No murmurs, ectopy, or gallups. . No lateral PMI. No thrills. Abdomen:  . Abdomen is soft, non-tender, non-distended . No hernias, masses, or organomegaly . Normoactive bowel sounds.  Musculoskeletal:  . No cyanosis, clubbing, or edema Skin:  . No rashes, lesions, ulcers . palpation of skin: no induration or nodules Neurologic:  . CN 2-12 intact . Sensation all 4 extremities intact Psychiatric:  . Mental status o Mood, affect appropriate o Orientation to person, place, time  . judgment and insight appear intact  I have personally reviewed the following:   Today's Data  . Vitals, bmp, cbc  Imaging  . CXR  Scheduled Meds: . ARIPiprazole  2 mg Oral QHS  . busPIRone  10 mg Oral TID  . chlorhexidine  15 mL Mouth Rinse BID  . Chlorhexidine Gluconate Cloth  6 each Topical Daily  . cholecalciferol  2,000 Units Oral QPC lunch  . enoxaparin (LOVENOX) injection  40 mg Subcutaneous QHS  .  feeding supplement  1 Container Oral TID BM  . folic acid  1 mg Oral QPC lunch  . gabapentin  300 mg Oral TID  . mouth rinse  15 mL Mouth Rinse q12n4p  . morphine  60 mg Oral Q12H  . multivitamin with minerals  1 tablet Oral Daily  . nicotine  21 mg Transdermal Daily  . pantoprazole  40 mg Oral QHS  . rosuvastatin  5 mg Oral Q M,W,F-1800  . sertraline  200 mg Oral QHS  . vitamin B-12  100 mcg Oral QPC lunch   Continuous Infusions: . ampicillin-sulbactam (UNASYN) IV 3 g (01/04/20 1624)    Principal Problem:   Acute respiratory failure with  hypoxia and hypercapnia (HCC) Active Problems:   CIGARETTE SMOKER   Chronic pain syndrome   Abdominal pain   Nausea & vomiting   Common bile duct stone   Acute cholecystitis   Abnormal MRI of abdomen   Cholelithiasis   Bipolar disorder (HCC)   Alcohol abuse   Anxiety   Acute on chronic diastolic CHF (congestive heart failure) (HCC)   Malnutrition of moderate degree   LOS: 12 days   A & P    Acute hypoxic hypercarbic respiratory failure: Due to acute on chronic diastolic CHF. She is not on oxygen supplementation at baseline.Currently requiring 2.5 L to maintain O2 saturation greater than 92%. Continue bronchodilators and pulmonary toilet. Personally reviewed chest x-ray done on 12/25/2019 which showed bibasilar pulmonary infiltrates and bilateral pleural effusion. Continue diuretics and to wean O2 down. Today the patient is seen sitting in bed with O2 off. She is maintaining SaO2 in the 90's for the most part with occasional dips into the 80's. Will qualify for O2 with ambulation tomorrow before discharge.  Acute on chronic diastolic CHF: Elevated BNP greater than 800.Continue IV diuretics Lasix 20 mg twice daily, will discharge to control blood pressures. Continue strict I's and O's and daily weight. Net I&O - 3.535.7 L. 0200 am dose of lasix has been discontinued to allow for better sleep. Continue to monitor electrolytes while on diuretics, urine output, and blood pressures. She will be discharged on a lower dose of oral lasix. Her blood pressure was too low this afternoon for discharge. Will give another bolus and re-evaluate in the morning.  Acute metabolic encephalopathy secondary to acute hypercarbic/hypoxic respiratory failure: Resolved. Pt has evaluated the patient and recommended Home health PT.   Acute cholecystitis/choledocholithiasis: Status post ERCP with stent placement. She is receiving IV Unasyn. She will need to follow up with general surgery as outpatient when her  respiratory situation has improved.  Blood cultures x2 no growth to date. The patient will need to follow-up with general surgery outpatient for possible lap chole once breathing is stabilized.  Bipolar disorder: Continue home medications.  Alcohol use disorder: Stable at this time. No signs of withdrawal.  Chronic anxiety: Continue home medications.  Hypokalemia: Resolved. Monitor and supplement.  Hypomagnesemia: Resolved. Monitor.   I have seen and examined this patient myself. I have spent 38 minutes in her evaluation and care.   DVT prophylaxis: Lovenox Code Status:Full code Family Communication:I have discussed the patient in detail with her daugher, Benjamine Mola. All questions answered to the best of my ability.  Disposition Plan:Pt will be qualified for O2 with ambulation tomorrow. I anticipate dc to home with [facility] PT/OT.  Denise Couts, DO [facility] Direct contact: see [email or web address]  7PM-7AM contact night coverage as above 01/04/2020, 5:52 PM  LOS: 7 days

## 2020-01-04 NOTE — Progress Notes (Signed)
   01/04/20 1329  MEWS Score  Resp (!) 24  Pulse Rate 75  BP (!) 94/53  SpO2 91 %  O2 Device Room Air  MEWS Score  MEWS RR 1  MEWS Pulse 0  MEWS Systolic 1  MEWS LOC 0  MEWS Temp 0  MEWS Score 2  MEWS Score Color Yellow  MEWS Assessment  Is this an acute change? Yes  MEWS guidelines implemented *See Row Information* Yellow  Provider Notification  Provider Name/Title Karie Kirks, DO  Date Provider Notified 01/04/20  Time Provider Notified 1330  Notification Type Page  Notification Reason Other (Comment) (per protocol)  Response See new orders  Date of Provider Response 01/04/20  Time of Provider Response 1340

## 2020-01-04 NOTE — Progress Notes (Signed)
SATURATION QUALIFICATIONS: (This note is used to comply with regulatory documentation for home oxygen)  Patient Saturations on Room Air at Rest = 92%  Patient Saturations on Room Air while Ambulating = 78%  Patient Saturations on 2 Liters of oxygen while Ambulating = 92%

## 2020-01-05 DIAGNOSIS — D62 Acute posthemorrhagic anemia: Secondary | ICD-10-CM

## 2020-01-05 DIAGNOSIS — K921 Melena: Secondary | ICD-10-CM

## 2020-01-05 LAB — CBC
HCT: 38.5 % (ref 36.0–46.0)
HCT: 42.7 % (ref 36.0–46.0)
Hemoglobin: 12.3 g/dL (ref 12.0–15.0)
Hemoglobin: 13.5 g/dL (ref 12.0–15.0)
MCH: 31.3 pg (ref 26.0–34.0)
MCH: 31.6 pg (ref 26.0–34.0)
MCHC: 31.9 g/dL (ref 30.0–36.0)
MCHC: 31.6 g/dL (ref 30.0–36.0)
MCV: 98 fL (ref 80.0–100.0)
MCV: 100 fL (ref 80.0–100.0)
Platelets: 176 10*3/uL (ref 150–400)
Platelets: 201 10*3/uL (ref 150–400)
RBC: 3.93 MIL/uL (ref 3.87–5.11)
RBC: 4.27 MIL/uL (ref 3.87–5.11)
RDW: 12.3 % (ref 11.5–15.5)
RDW: 12.5 % (ref 11.5–15.5)
WBC: 7 10*3/uL (ref 4.0–10.5)
WBC: 7.5 10*3/uL (ref 4.0–10.5)
nRBC: 0 % (ref 0.0–0.2)
nRBC: 0 % (ref 0.0–0.2)

## 2020-01-05 LAB — HEMOGLOBIN AND HEMATOCRIT, BLOOD
HCT: 39.1 % (ref 36.0–46.0)
Hemoglobin: 12.6 g/dL (ref 12.0–15.0)

## 2020-01-05 MED ORDER — SODIUM CHLORIDE 0.9 % IV SOLN
80.0000 mg | Freq: Once | INTRAVENOUS | Status: AC
  Administered 2020-01-05: 80 mg via INTRAVENOUS
  Filled 2020-01-05: qty 80

## 2020-01-05 MED ORDER — SODIUM CHLORIDE 0.9 % IV SOLN
8.0000 mg/h | INTRAVENOUS | Status: DC
  Administered 2020-01-05 (×2): 8 mg/h via INTRAVENOUS
  Filled 2020-01-05 (×3): qty 80

## 2020-01-05 MED ORDER — ALPRAZOLAM 0.25 MG PO TABS
0.2500 mg | ORAL_TABLET | Freq: Three times a day (TID) | ORAL | Status: DC | PRN
  Administered 2020-01-05 – 2020-01-07 (×4): 0.25 mg via ORAL
  Filled 2020-01-05 (×4): qty 1

## 2020-01-05 NOTE — Progress Notes (Signed)
PROGRESS NOTE  Denise King [id number] DOB: [birth year] DOA: 12/23/2019 PCP: Biagio Borg, MD  Brief History   69 year old female with past medical history of anxiety, alcohol abuse, bipolar disorder, hypertension and chronic pain syndrome/on chronic narcotics presented to the emergency room on 12/26 with complaints of nausea and vomiting and abdominal pain who was admitted for acute cholecystitis/common bile duct stones. General surgery and GI were consulted and patient went for ERCP on 12/28. Postprocedure, she became hypoxic and hypercarbic and transferred to the ICU on BiPAP. She was able to be weaned off the BiPAP and is currently on 4 L nasal cannula.Surgery has been following up and plans to do cholecystectomy laparoscopically as outpatient following stabilization for breathing.  The patient is slowly being weaned off of supplementary O2 as possible. Today the patient is seen sitting in bed with O2 off. She is maintaining SaO2 in the 90's for the most part with occasional dips into the 80's. Will qualify for O2 with ambulation tomorrow before discharge.  Patient had hypotension that did not immediately respond to IV fluid bolus today prior to discharge. Discharge was held and she will be given another bolus of IV fluid.  On the evening of 01/04/2020 the patient had a black stool after an episode of hypotension earlier in the day. This morning the hemoglobin was 12.6, 1.5 gm less than it had been 5 days earlier. The patient has undergone ERCP with sphincterotomy. There is concern that bleeding could be coming from this site. Her heparin has been stopped. GI plans to do an EGD in the morning of 01/05/2020.  Consultants  . General Surgery . PCCM . Gastroenterology  Procedures  . None  Antibiotics   Anti-infectives (From admission, onward)   Start     Dose/Rate Route Frequency Ordered Stop   12/25/19 0930  Ampicillin-Sulbactam (UNASYN) 3 g in sodium chloride 0.9 % 100 mL IVPB      3 g 200 mL/hr over 30 Minutes Intravenous  Once 12/25/19 0928 12/25/19 0958   12/23/19 2115  Ampicillin-Sulbactam (UNASYN) 3 g in sodium chloride 0.9 % 100 mL IVPB     3 g 200 mL/hr over 30 Minutes Intravenous Every 6 hours 12/23/19 2100     12/23/19 2103  valACYclovir (VALTREX) tablet 500 mg     500 mg Oral 2 times daily PRN 12/23/19 2104        Subjective  The patient is resting comfortably. She does tell me of melanotic stool overnight.  Objective   Vitals:  Vitals:   01/05/20 1046 01/05/20 1345  BP: 124/68 123/72  Pulse: 83 73  Resp: 14 12  Temp: 98.4 F (36.9 C) 98.6 F (37 C)  SpO2: 97% 100%   Exam:  Constitutional:  . The patient is awake, alert, and oriented x 3. No acute distress. Respiratory:  . No increased work of breathing. . No wheezes, rales, or rhonchi . No tactile fremitus Cardiovascular:  . Regular rate and rhythm . No murmurs, ectopy, or gallups. . No lateral PMI. No thrills. Abdomen:  . Abdomen is soft, non-tender, non-distended . No hernias, masses, or organomegaly . Normoactive bowel sounds.  Musculoskeletal:  . No cyanosis, clubbing, or edema Skin:  . No rashes, lesions, ulcers . palpation of skin: no induration or nodules Neurologic:  . CN 2-12 intact . Sensation all 4 extremities intact Psychiatric:  . Mental status o Mood, affect appropriate o Orientation to person, place, time  . judgment and insight appear intact  I  have personally reviewed the following:   Today's Data  . Vitals, bmp, cbc  Imaging  . CXR  Scheduled Meds: . ARIPiprazole  2 mg Oral QHS  . busPIRone  10 mg Oral TID  . chlorhexidine  15 mL Mouth Rinse BID  . Chlorhexidine Gluconate Cloth  6 each Topical Daily  . cholecalciferol  2,000 Units Oral QPC lunch  . feeding supplement  1 Container Oral TID BM  . folic acid  1 mg Oral QPC lunch  . gabapentin  300 mg Oral TID  . mouth rinse  15 mL Mouth Rinse q12n4p  . morphine  60 mg Oral Q12H  .  multivitamin with minerals  1 tablet Oral Daily  . nicotine  21 mg Transdermal Daily  . rosuvastatin  5 mg Oral Q M,W,F-1800  . sertraline  200 mg Oral QHS  . vitamin B-12  100 mcg Oral QPC lunch   Continuous Infusions: . ampicillin-sulbactam (UNASYN) IV 3 g (01/05/20 1409)  . pantoprozole (PROTONIX) infusion 8 mg/hr (01/05/20 1301)    Principal Problem:   Acute respiratory failure with hypoxia and hypercapnia (HCC) Active Problems:   CIGARETTE SMOKER   Chronic pain syndrome   Abdominal pain   Nausea & vomiting   Common bile duct stone   Acute cholecystitis   Abnormal MRI of abdomen   Cholelithiasis   Bipolar disorder (HCC)   Alcohol abuse   Anxiety   Acute on chronic diastolic CHF (congestive heart failure) (HCC)   Malnutrition of moderate degree   LOS: 13 days   A & P    Acute hypoxic hypercarbic respiratory failure: Due to acute on chronic diastolic CHF. She is not on oxygen supplementation at baseline.Currently requiring 2.5 L to maintain O2 saturation greater than 92%. Continue bronchodilators and pulmonary toilet. Personally reviewed chest x-ray done on 12/25/2019 which showed bibasilar pulmonary infiltrates and bilateral pleural effusion. Continue diuretics and to wean O2 down. Today the patient is seen sitting in bed with O2 off. She is maintaining SaO2 in the 90's for the most part with occasional dips into the 80's. Will qualify for O2 with ambulation tomorrow before discharge.  Acute on chronic diastolic CHF: Elevated BNP greater than 800.Continue IV diuretics Lasix 20 mg twice daily, will discharge to control blood pressures. Continue strict I's and O's and daily weight. Net I&O - 3.535.7 L. 0200 am dose of lasix has been discontinued to allow for better sleep. Continue to monitor electrolytes while on diuretics, urine output, and blood pressures. She will be discharged on a lower dose of oral lasix. Her blood pressure was too low this afternoon for discharge. Will give  another bolus and re-evaluate in the morning.  Hypotension, melena, anemia: The patient had a melanotic stool last night after she had an episode of hypotension that led to the cancellation of her discharge yesterday. This morning this stool was followed by a drop in her hemoglobin from 14.1 5 days ago to 12.6 this morning. GI has been consulted again. She will undergo an EGD in the morning. There is concern that she may be bleeding from sphincerotomy performed in her ERCP.  Acute metabolic encephalopathy secondary to acute hypercarbic/hypoxic respiratory failure: Resolved. Pt has evaluated the patient and recommended Home health PT.   Acute cholecystitis/choledocholithiasis: Status post ERCP with stent placement. She is receiving IV Unasyn. She will need to follow up with general surgery as outpatient when her respiratory situation has improved.  Blood cultures x2 no growth to date. The  patient will need to follow-up with general surgery outpatient for possible lap chole once breathing is stabilized.  Bipolar disorder: Continue home medications.  Alcohol use disorder: Stable at this time. No signs of withdrawal.  Chronic anxiety: Continue home medications.  Hypokalemia: Resolved. Monitor and supplement.  Hypomagnesemia: Resolved. Monitor.   I have seen and examined this patient myself. I have spent 46 minutes in her evaluation and care.   DVT prophylaxis: Lovenox Code Status:Full code Family Communication:I have discussed the patient in detail with her daugher, Benjamine Mola. All questions answered to the best of my ability.  Disposition Plan:Unsafe to discharge at this time due to melanotic stool in the setting of hypotension and a 1.5 gm drop in hemoglobin. GI consulted.  Shanetra Blumenstock, DO [facility] Direct contact: see [email or web address]  7PM-7AM contact night coverage as above 01/05/2020, 2:27 PM  LOS: 7 days

## 2020-01-05 NOTE — Progress Notes (Signed)
Patient had a bowel movement using bedside commode, type 3 clay black sausage like medium stool was noted, this RN notified MD. Will continue to monitor patient with remainder of shift.

## 2020-01-05 NOTE — Progress Notes (Addendum)
Daily Rounding Note  01/05/2020, 11:54 AM  LOS: 13 days   SUBJECTIVE:   Chief complaint: Melena and dropping Hgb.     Known to Dr. Arelia Longest for [name]-d.  History adenomatous colon polyps on latest colonoscopy 03/2016.  Antral gastritis and benign antral polyp on EGD 01/2014.  Initial consult this admission on 12/24/2023 choledocholithiasis.  CTAP showed thickening in the transverse colon, edema, hyperenhancement of gastric antral mucosa.  Cholelithiasis, pericholecystic fluid.  11 mm diameter CBD with at least 1 filling defect.  Fatty liver. Abdominal ultrasound showed gallstones, pericholecystic fluid.  Equivocal for acute cholecystitis. MRI/MRCP showed cholelithiasis, small pericholecystic fluid.  8 to 9 mm CBD with mild intra-/extrahepatic biliary ductal dilatation with several small granular stones in CBD.  Pancreas divisum anatomy with major papilla low on descending duodenum. 12/25/2019 ERCP. Dr. Carlean Purl.  Mildly dilated CBD, no stones, cholangiogram negative.  No clear cholelithiasis.  MD did perform sphincterotomy and balloon sweeps but no debris removed. Following ERCP developed hypoxia, hypercarbia requiring BiPAP and ICU transfer.   Surgeons note of 12/30/2019 at which point Dr. Leighton Ruff signed off and recommended postponing cholecystectomy until patient's lungs had improved. Today she is normotensive.  Having sats well into the 90s on both room air and 1 L nasal cannula oxygen.  For a couple of days patient has been having darker, black stools.  Yesterday she had hypotension requiring IV fluid bolus which postponed her plan to discharge home.. Hgb dropped 14.4 (1/4) >> 12.6 (1/8).  She has been receiving DVT prevention doses of Lovenox. Platelets have been declining as of 12/29: 134 >> 107 (01/01/20)  Her main complaint is frequent stools, imodium slows these down.  Did not tolerate questran in past.  Occasional nausea,  some mild postprandial abdominal discomfort.     OBJECTIVE:         Vital signs in last 24 hours:    Temp:  [97.6 F (36.4 C)-99.8 F (37.7 C)] 98.4 F (36.9 C) (01/08 1046) Pulse Rate:  [67-83] 83 (01/08 1046) Resp:  [12-24] 14 (01/08 1046) BP: (94-130)/(46-69) 124/68 (01/08 1046) SpO2:  [91 %-98 %] 97 % (01/08 1046) Weight:  [52.9 kg] 52.9 kg (01/08 0513) Last BM Date: 01/04/20 Filed Weights   01/03/20 0928 01/04/20 0500 01/05/20 0513  Weight: 52.9 kg 53 kg 52.9 kg   General: Thin, moderately unwell but not acutely ill-appearing or uncomfortable WF. Heart: RRR. Chest: Clear bilaterally Abdomen: Soft.  Mild upper abdominal tenderness without guarding or rebound.  Active bowel sounds.  No distention. Extremities: No CCE. Neuro/Psych: Alert.  Oriented x3.  Intake/Output from previous day: 01/07 0701 - 01/08 0700 In: 1939.6 [P.O.:480; IV Piggyback:1459.6] Out: -   Intake/Output this shift: No intake/output data recorded.  Lab Results: Recent Labs    01/05/20 1103  HGB 12.6  HCT 39.1     ASSESMENT:   *    Melenic stool.  Hypotension.  2 gm Hgb drop c/w 5 days ago.    Wonder if she is bleeding from the sphincterotomy site.  *     Cholecystitis, cholelithiasis.  Continues on Unasyn.  *    Chronic diarrhea, longstanding history of this.  Currently with a 4+ month run of diarrhea.  Random colon biopsies at colonoscopy 2017 were benign and no evidence of microscopic/collagenous colitis.  [name] D diagnosis.  *      Chronic pain.  On chronic narcotics.  *     Hypoxia, hypercarbia  following ERCP which landed her on BiPAP and in the ICU.  This has resolved and she is satting well on room air.  PLAN   *    Changed oral Protonix to bolus and infusion.  *    CBC at 5 PM and in a.m. tomorrow.  *    Orders placed for EGD for tomorrow given that she has had breakfast already today.  Last solid food was around 930 this morning.  *     Lovenox is now discontinued.  Denise King  01/05/2020, 11:54 AM Phone [phone]  GI ATTENDING  History, laboratories, x-rays, recent endoscopy report reviewed.  Patient seen and examined.  Seems to had transient upper GI bleed with transient hypotension.  Leading suspicion is possible bleeding at the sphincterotomy site.  Plan upper endoscopy.  However, the patient had full meal earlier.  Thankfully, she is stable.  We will plan endoscopy in the morning.  Sooner if there is meaningful clinical deterioration. The nature of the procedure, as well as the risks, benefits, and alternatives were carefully and thoroughly reviewed with the patient. Ample time for discussion and questions allowed. The patient understood, was satisfied, and agreed to proceed. Continue PPI discontinuation of Lovenox noted.  Docia Chuck. Geri Seminole., M.D. [facility] Division of Gastroenterology

## 2020-01-05 NOTE — Progress Notes (Signed)
Physical Therapy Treatment Patient Details Name: Denise King MRN: [id number] DOB: [birth year] Today's Date: 01/05/2020    History of Present Illness Pt is a 69 y/o female with past medical history significant for irritable bowel syndrome, chronic pain syndrome, hypertension, GERD, alcohol, diverticulosis, anxiety and anemia.  Patient was admitted with gallstone cholecystitis. Pt underwent ERCP on 12/28 with subsequent hypercarbic resp failure.    PT Comments    Pt pleasant and eager to return home. Pt maintained sat >92% throughout on RW with ability to return to long hall ambulation this session. Pt educated for transfers and activity for home as well as continued HEP and increased gait.     Follow Up Recommendations  Home health PT;Supervision/Assistance - 24 hour     Equipment Recommendations  None recommended by PT    Recommendations for Other Services       Precautions / Restrictions Precautions Precautions: Fall    Mobility  Bed Mobility Overal bed mobility: Modified Independent Bed Mobility: Supine to Sit           General bed mobility comments: HOB 30 degrees  Transfers Overall transfer level: Needs assistance   Transfers: Sit to/from Stand Sit to Stand: Supervision         General transfer comment: cues for hand placement  Ambulation/Gait Ambulation/Gait assistance: Supervision Gait Distance (Feet): 220 Feet Assistive device: Rolling walker (2 wheeled) Gait Pattern/deviations: Step-through pattern;Trunk flexed;Decreased stride length   Gait velocity interpretation: 1.31 - 2.62 ft/sec, indicative of limited community ambulator General Gait Details: cues for posture and to monitor fatigue. pt with SpO2 92-99% throughout on RA   Stairs             Wheelchair Mobility    Modified Rankin (Stroke Patients Only)       Balance Overall balance assessment: Needs assistance   Sitting balance-Leahy Scale: Good     Standing balance  support: Bilateral upper extremity supported Standing balance-Leahy Scale: Poor Standing balance comment: bil Ue support on RW                            Cognition Arousal/Alertness: Awake/alert Behavior During Therapy: WFL for tasks assessed/performed Overall Cognitive Status: Within Functional Limits for tasks assessed                                        Exercises General Exercises - Lower Extremity Long Arc Quad: AROM;Both;Seated;10 reps Hip ABduction/ADduction: AROM;Both;10 reps;Seated Hip Flexion/Marching: AROM;Both;Seated;10 reps    General Comments        Pertinent Vitals/Pain Faces Pain Scale: Hurts whole lot Pain Location: back Pain Descriptors / Indicators: Aching Pain Intervention(s): Limited activity within patient's tolerance;Monitored during session;Repositioned;Patient requesting pain meds-RN notified    Home Living                      Prior Function            PT Goals (current goals can now be found in the care plan section) Progress towards PT goals: Progressing toward goals    Frequency           PT Plan Current plan remains appropriate    Co-evaluation              AM-PAC PT "6 Clicks" Mobility   Outcome Measure  Help needed turning from your  back to your side while in a flat bed without using bedrails?: None Help needed moving from lying on your back to sitting on the side of a flat bed without using bedrails?: A Little Help needed moving to and from a bed to a chair (including a wheelchair)?: A Little Help needed standing up from a chair using your arms (e.g., wheelchair or bedside chair)?: None Help needed to walk in hospital room?: A Little Help needed climbing 3-5 steps with a railing? : A Little 6 Click Score: 20    End of Session   Activity Tolerance: Patient tolerated treatment well Patient left: in chair;with call bell/phone within reach Nurse Communication: Mobility status PT  Visit Diagnosis: Muscle weakness (generalized) (M62.81);Other abnormalities of gait and mobility (R26.89)     Time: [id number] PT Time Calculation (min) (ACUTE ONLY): 23 min  Charges:  $Gait Training: 8-22 mins $Therapeutic Exercise: 8-22 mins                     Gearld Kerstein P, PT Acute Rehabilitation Services Pager: [phone] Office: [place] 01/05/2020, 1:12 PM

## 2020-01-05 NOTE — H&P (View-Only) (Signed)
Daily Rounding Note  01/05/2020, 11:54 AM  LOS: 13 days   SUBJECTIVE:   Chief complaint: Melena and dropping Hgb.     Known to Dr. Arelia Longest for [name]-d.  History adenomatous colon polyps on latest colonoscopy 03/2016.  Antral gastritis and benign antral polyp on EGD 01/2014.  Initial consult this admission on 12/24/2023 choledocholithiasis.  CTAP showed thickening in the transverse colon, edema, hyperenhancement of gastric antral mucosa.  Cholelithiasis, pericholecystic fluid.  11 mm diameter CBD with at least 1 filling defect.  Fatty liver. Abdominal ultrasound showed gallstones, pericholecystic fluid.  Equivocal for acute cholecystitis. MRI/MRCP showed cholelithiasis, small pericholecystic fluid.  8 to 9 mm CBD with mild intra-/extrahepatic biliary ductal dilatation with several small granular stones in CBD.  Pancreas divisum anatomy with major papilla low on descending duodenum. 12/25/2019 ERCP. Dr. Carlean Purl.  Mildly dilated CBD, no stones, cholangiogram negative.  No clear cholelithiasis.  MD did perform sphincterotomy and balloon sweeps but no debris removed. Following ERCP developed hypoxia, hypercarbia requiring BiPAP and ICU transfer.   Surgeons note of 12/30/2019 at which point Dr. Leighton Ruff signed off and recommended postponing cholecystectomy until patient's lungs had improved. Today she is normotensive.  Having sats well into the 90s on both room air and 1 L nasal cannula oxygen.  For a couple of days patient has been having darker, black stools.  Yesterday she had hypotension requiring IV fluid bolus which postponed her plan to discharge home.. Hgb dropped 14.4 (1/4) >> 12.6 (1/8).  She has been receiving DVT prevention doses of Lovenox. Platelets have been declining as of 12/29: 134 >> 107 (01/01/20)  Her main complaint is frequent stools, imodium slows these down.  Did not tolerate questran in past.  Occasional nausea,  some mild postprandial abdominal discomfort.     OBJECTIVE:         Vital signs in last 24 hours:    Temp:  [97.6 F (36.4 C)-99.8 F (37.7 C)] 98.4 F (36.9 C) (01/08 1046) Pulse Rate:  [67-83] 83 (01/08 1046) Resp:  [12-24] 14 (01/08 1046) BP: (94-130)/(46-69) 124/68 (01/08 1046) SpO2:  [91 %-98 %] 97 % (01/08 1046) Weight:  [52.9 kg] 52.9 kg (01/08 0513) Last BM Date: 01/04/20 Filed Weights   01/03/20 0928 01/04/20 0500 01/05/20 0513  Weight: 52.9 kg 53 kg 52.9 kg   General: Thin, moderately unwell but not acutely ill-appearing or uncomfortable WF. Heart: RRR. Chest: Clear bilaterally Abdomen: Soft.  Mild upper abdominal tenderness without guarding or rebound.  Active bowel sounds.  No distention. Extremities: No CCE. Neuro/Psych: Alert.  Oriented x3.  Intake/Output from previous day: 01/07 0701 - 01/08 0700 In: 1939.6 [P.O.:480; IV Piggyback:1459.6] Out: -   Intake/Output this shift: No intake/output data recorded.  Lab Results: Recent Labs    01/05/20 1103  HGB 12.6  HCT 39.1     ASSESMENT:   *    Melenic stool.  Hypotension.  2 gm Hgb drop c/w 5 days ago.    Wonder if she is bleeding from the sphincterotomy site.  *     Cholecystitis, cholelithiasis.  Continues on Unasyn.  *    Chronic diarrhea, longstanding history of this.  Currently with a 4+ month run of diarrhea.  Random colon biopsies at colonoscopy 2017 were benign and no evidence of microscopic/collagenous colitis.  [name] D diagnosis.  *      Chronic pain.  On chronic narcotics.  *     Hypoxia, hypercarbia  following ERCP which landed her on BiPAP and in the ICU.  This has resolved and she is satting well on room air.  PLAN   *    Changed oral Protonix to bolus and infusion.  *    CBC at 5 PM and in a.m. tomorrow.  *    Orders placed for EGD for tomorrow given that she has had breakfast already today.  Last solid food was around 930 this morning.  *     Lovenox is now discontinued.  Azucena Freed  01/05/2020, 11:54 AM Phone [phone]  GI ATTENDING  History, laboratories, x-rays, recent endoscopy report reviewed.  Patient seen and examined.  Seems to had transient upper GI bleed with transient hypotension.  Leading suspicion is possible bleeding at the sphincterotomy site.  Plan upper endoscopy.  However, the patient had full meal earlier.  Thankfully, she is stable.  We will plan endoscopy in the morning.  Sooner if there is meaningful clinical deterioration. The nature of the procedure, as well as the risks, benefits, and alternatives were carefully and thoroughly reviewed with the patient. Ample time for discussion and questions allowed. The patient understood, was satisfied, and agreed to proceed. Continue PPI discontinuation of Lovenox noted.  Docia Chuck. Geri Seminole., M.D. [facility] Division of Gastroenterology

## 2020-01-06 ENCOUNTER — Inpatient Hospital Stay (HOSPITAL_COMMUNITY): Payer: Medicare HMO | Admitting: Anesthesiology

## 2020-01-06 ENCOUNTER — Encounter (HOSPITAL_COMMUNITY)
Admission: EM | Disposition: A | Discharge status: Home Health | Payer: Self-pay | Source: Home / Self Care | Attending: Internal Medicine

## 2020-01-06 ENCOUNTER — Encounter (HOSPITAL_COMMUNITY): Payer: Self-pay | Admitting: Internal Medicine

## 2020-01-06 DIAGNOSIS — K264 Chronic or unspecified duodenal ulcer with hemorrhage: Secondary | ICD-10-CM

## 2020-01-06 HISTORY — PX: SCLEROTHERAPY: SHX6841

## 2020-01-06 HISTORY — PX: HOT HEMOSTASIS: SHX5433

## 2020-01-06 HISTORY — PX: ESOPHAGOGASTRODUODENOSCOPY (EGD) WITH PROPOFOL: SHX5813

## 2020-01-06 LAB — BASIC METABOLIC PANEL
Anion gap: 8 (ref 5–15)
BUN: 9 mg/dL (ref 8–23)
CO2: 31 mmol/L (ref 22–32)
Calcium: 8.7 mg/dL — ABNORMAL LOW (ref 8.9–10.3)
Chloride: 100 mmol/L (ref 98–111)
Creatinine, Ser: 0.71 mg/dL (ref 0.44–1.00)
GFR calc Af Amer: >60 mL/min (ref >60)
GFR calc non Af Amer: >60 mL/min (ref >60)
Glucose, Bld: 93 mg/dL (ref 70–99)
Potassium: 3.7 mmol/L (ref 3.5–5.1)
Sodium: 139 mmol/L (ref 135–145)

## 2020-01-06 LAB — CBC WITH DIFFERENTIAL/PLATELET
Abs Immature Granulocytes: 0.01 10*3/uL (ref 0.00–0.07)
Basophils Absolute: 0.1 10*3/uL (ref 0.0–0.1)
Basophils Relative: 1 %
Eosinophils Absolute: 0.1 10*3/uL (ref 0.0–0.5)
Eosinophils Relative: 2 %
HCT: 40.3 % (ref 36.0–46.0)
Hemoglobin: 12.7 g/dL (ref 12.0–15.0)
Immature Granulocytes: 0 %
Lymphocytes Relative: 47 %
Lymphs Abs: 3 10*3/uL (ref 0.7–4.0)
MCH: 31.7 pg (ref 26.0–34.0)
MCHC: 31.5 g/dL (ref 30.0–36.0)
MCV: 100.5 fL — ABNORMAL HIGH (ref 80.0–100.0)
Monocytes Absolute: 0.6 10*3/uL (ref 0.1–1.0)
Monocytes Relative: 10 %
Neutro Abs: 2.5 10*3/uL (ref 1.7–7.7)
Neutrophils Relative %: 40 %
Platelets: 189 10*3/uL (ref 150–400)
RBC: 4.01 MIL/uL (ref 3.87–5.11)
RDW: 12.5 % (ref 11.5–15.5)
WBC: 6.3 10*3/uL (ref 4.0–10.5)
nRBC: 0 % (ref 0.0–0.2)

## 2020-01-06 LAB — OCCULT BLOOD X 1 CARD TO LAB, STOOL: Fecal Occult Bld: POSITIVE — AB

## 2020-01-06 SURGERY — ESOPHAGOGASTRODUODENOSCOPY (EGD) WITH PROPOFOL
Anesthesia: Monitor Anesthesia Care

## 2020-01-06 MED ORDER — ONDANSETRON HCL 4 MG/2ML IJ SOLN
INTRAMUSCULAR | Status: DC | PRN
  Administered 2020-01-06: 4 mg via INTRAVENOUS

## 2020-01-06 MED ORDER — LACTATED RINGERS IV SOLN: INTRAVENOUS | Status: DC

## 2020-01-06 MED ORDER — LIDOCAINE HCL (CARDIAC) PF 100 MG/5ML IV SOSY
PREFILLED_SYRINGE | INTRAVENOUS | Status: DC | PRN
  Administered 2020-01-06: 80 mg via INTRAVENOUS

## 2020-01-06 MED ORDER — SODIUM CHLORIDE (PF) 0.9 % IJ SOLN
PREFILLED_SYRINGE | INTRAMUSCULAR | Status: DC | PRN
  Administered 2020-01-06: 2 mL

## 2020-01-06 MED ORDER — PROPOFOL 10 MG/ML IV BOLUS
INTRAVENOUS | Status: DC | PRN
  Administered 2020-01-06: 30 mg via INTRAVENOUS

## 2020-01-06 MED ORDER — EPINEPHRINE 1 MG/10ML IJ SOSY
PREFILLED_SYRINGE | INTRAMUSCULAR | Status: AC
  Filled 2020-01-06: qty 10

## 2020-01-06 MED ORDER — PROPOFOL 500 MG/50ML IV EMUL
INTRAVENOUS | Status: DC | PRN
  Administered 2020-01-06: 75 ug/kg/min via INTRAVENOUS

## 2020-01-06 SURGICAL SUPPLY — 15 items

## 2020-01-06 NOTE — Progress Notes (Signed)
PROGRESS NOTE  Denise King [id number] DOB: 1951-07-14 DOA: 12/23/2019 PCP: Biagio Borg, MD  Brief History   69 year old female with past medical history of anxiety, alcohol abuse, bipolar disorder, hypertension and chronic pain syndrome/on chronic narcotics presented to the emergency room on 12/26 with complaints of nausea and vomiting and abdominal pain who was admitted for acute cholecystitis/common bile duct stones. General surgery and GI were consulted and patient went for ERCP on 12/28. Postprocedure, she became hypoxic and hypercarbic and transferred to the ICU on BiPAP. She was able to be weaned off the BiPAP and is currently on 4 L nasal cannula.Surgery has been following up and plans to do cholecystectomy laparoscopically as outpatient following stabilization for breathing.  The patient is slowly being weaned off of supplementary O2 as possible. Today the patient is seen sitting in bed with O2 off. She is maintaining SaO2 in the 90's for the most part with occasional dips into the 80's. Will qualify for O2 with ambulation tomorrow before discharge.  Patient had hypotension that did not immediately respond to IV fluid bolus today prior to discharge. Discharge was held and she will be given another bolus of IV fluid.  On the evening of 01/04/2020 the patient had a black stool after an episode of hypotension earlier in the day. This morning the hemoglobin was 12.6, 1.5 gm less than it had been 5 days earlier. The patient has undergone ERCP with sphincterotomy. There is concern that bleeding could be coming from this site. Her heparin has been stopped.   EGD was performed on the morning os 01/06/2020. It demonstrated a non-bleeding pigmented ulcer at the apex of the ampulla where sphincterotomy was performed. This was injected. Dr. Henrene Pastor recommended that we observe the patient overnight and discharge her to home in the morning if there is no evidence of further  bleeding.  Consultants  . General Surgery . PCCM . Gastroenterology  Procedures  . EGD with injection of ulcer at apex of ampulla at site of prior sphincterotomy.  Antibiotics   Anti-infectives (From admission, onward)   Start     Dose/Rate Route Frequency Ordered Stop   12/25/19 0930  Ampicillin-Sulbactam (UNASYN) 3 g in sodium chloride 0.9 % 100 mL IVPB     3 g 200 mL/hr over 30 Minutes Intravenous  Once 12/25/19 0928 12/25/19 0958   12/23/19 2115  Ampicillin-Sulbactam (UNASYN) 3 g in sodium chloride 0.9 % 100 mL IVPB     3 g 200 mL/hr over 30 Minutes Intravenous Every 6 hours 12/23/19 2100     12/23/19 2103  valACYclovir (VALTREX) tablet 500 mg     500 mg Oral 2 times daily PRN 12/23/19 2104        Subjective  The patient is resting comfortably. No new complaints.  Objective   Vitals:  Vitals:   01/06/20 1238 01/06/20 1300  BP: (!) 127/49 116/61  Pulse: 68 72  Resp: 12 14  Temp:  98.7 F (37.1 C)  SpO2: 98% 95%   Exam:  Constitutional:  . The patient is awake, alert, and oriented x 3. No acute distress. Respiratory:  . No increased work of breathing. . No wheezes, rales, or rhonchi . No tactile fremitus Cardiovascular:  . Regular rate and rhythm . No murmurs, ectopy, or gallups. . No lateral PMI. No thrills. Abdomen:  . Abdomen is soft, non-tender, non-distended . No hernias, masses, or organomegaly . Normoactive bowel sounds.  Musculoskeletal:  . No cyanosis, clubbing, or edema Skin:  .  No rashes, lesions, ulcers . palpation of skin: no induration or nodules Neurologic:  . CN 2-12 intact . Sensation all 4 extremities intact Psychiatric:  . Mental status o Mood, affect appropriate o Orientation to person, place, time  . judgment and insight appear intact  I have personally reviewed the following:   Today's Data  . Vitals, bmp, cbc  Imaging  . CXR  Scheduled Meds: . ARIPiprazole  2 mg Oral QHS  . busPIRone  10 mg Oral TID  .  chlorhexidine  15 mL Mouth Rinse BID  . Chlorhexidine Gluconate Cloth  6 each Topical Daily  . cholecalciferol  2,000 Units Oral QPC lunch  . feeding supplement  1 Container Oral TID BM  . folic acid  1 mg Oral QPC lunch  . gabapentin  300 mg Oral TID  . mouth rinse  15 mL Mouth Rinse q12n4p  . morphine  60 mg Oral Q12H  . multivitamin with minerals  1 tablet Oral Daily  . nicotine  21 mg Transdermal Daily  . rosuvastatin  5 mg Oral Q M,W,F-1800  . sertraline  200 mg Oral QHS  . vitamin B-12  100 mcg Oral QPC lunch   Continuous Infusions: . ampicillin-sulbactam (UNASYN) IV 3 g (01/06/20 0418)  . pantoprozole (PROTONIX) infusion 8 mg/hr (01/06/20 0300)    Principal Problem:   Acute respiratory failure with hypoxia and hypercapnia (HCC) Active Problems:   CIGARETTE SMOKER   Chronic pain syndrome   Abdominal pain   Nausea & vomiting   Common bile duct stone   Acute cholecystitis   Abnormal MRI of abdomen   Cholelithiasis   Bipolar disorder (HCC)   Alcohol abuse   Anxiety   Acute on chronic diastolic CHF (congestive heart failure) (HCC)   Malnutrition of moderate degree   Duodenal ulcer hemorrhagic   LOS: 14 days   A & P    Acute hypoxic hypercarbic respiratory failure: Due to acute on chronic diastolic CHF. She is not on oxygen supplementation at baseline.Currently requiring 2.5 L to maintain O2 saturation greater than 92%. Continue bronchodilators and pulmonary toilet. Personally reviewed chest x-ray done on 12/25/2019 which showed bibasilar pulmonary infiltrates and bilateral pleural effusion. Continue diuretics and to wean O2 down. Today the patient is seen sitting in bed with O2 off. She is maintaining SaO2 in the 90's for the most part with occasional dips into the 80's. Will qualify for O2 with ambulation tomorrow before discharge.  Acute on chronic diastolic CHF: Elevated BNP greater than 800.Continue IV diuretics Lasix 20 mg twice daily, will discharge to control  blood pressures. Continue strict I's and O's and daily weight. Net I&O - 3.535.7 L. 0200 am dose of lasix has been discontinued to allow for better sleep. Continue to monitor electrolytes while on diuretics, urine output, and blood pressures. She will be discharged on a lower dose of oral lasix. Her blood pressure was too low this afternoon for discharge. Will give another bolus and re-evaluate in the morning.  Hypotension, melena, anemia: The patient had a melanotic stool last night after she had an episode of hypotension that led to the cancellation of her discharge yesterday. This morning this stool was followed by a drop in her hemoglobin from 14.1 5 days ago to 12.6 this morning. GI has been consulted again.EGD was performed on the morning os 01/06/2020. It demonstrated a non-bleeding pigmented ulcer at the apex of the ampulla where sphincterotomy was performed. This was injected. Dr. Henrene Pastor recommended that we  observe the patient overnight and discharge her to home in the morning if there is no evidence of further bleeding.  Acute metabolic encephalopathy secondary to acute hypercarbic/hypoxic respiratory failure: Resolved. Pt has evaluated the patient and recommended Home health PT.   Acute cholecystitis/choledocholithiasis: Status post ERCP with stent placement. She is receiving IV Unasyn. She will need to follow up with general surgery as outpatient when her respiratory situation has improved.  Blood cultures x2 no growth to date. The patient will need to follow-up with general surgery outpatient for possible lap chole once breathing is stabilized.  Bipolar disorder: Continue home medications.  Alcohol use disorder: Stable at this time. No signs of withdrawal.  Chronic anxiety: Continue home medications.  Hypokalemia: Resolved. Monitor and supplement.  Hypomagnesemia: Resolved. Monitor.   I have seen and examined this patient myself. I have spent 35 minutes in her evaluation and care.    DVT prophylaxis: SCD's Code Status:Full code Family Communication:I have discussed the patient in detail with her daugher, Benjamine Mola. All questions answered to the best of my ability.  Disposition Plan:Unsafe to discharge at this time due to melanotic stool in the setting of hypotension and a 1.5 gm drop in hemoglobin. GI consulted.  Otila Starn, DO [facility] Direct contact: see [email or web address]  7PM-7AM contact night coverage as above 01/06/2020, 5:05 PM  LOS: 7 days

## 2020-01-06 NOTE — Anesthesia Postprocedure Evaluation (Signed)
Anesthesia Post Note  Patient: Denise King  Procedure(s) Performed: ESOPHAGOGASTRODUODENOSCOPY (EGD) WITH PROPOFOL (N/A ) HOT HEMOSTASIS (ARGON PLASMA COAGULATION/BICAP) (N/A ) SCLEROTHERAPY     Patient location during evaluation: Endoscopy Anesthesia Type: MAC Level of consciousness: awake Pain management: pain level controlled Vital Signs Assessment: post-procedure vital signs reviewed and stable Respiratory status: spontaneous breathing Cardiovascular status: stable Postop Assessment: no apparent nausea or vomiting Anesthetic complications: no    Last Vitals:  Vitals:   01/06/20 1238 01/06/20 1300  BP: (!) 127/49 116/61  Pulse: 68 72  Resp: 12 14  Temp:  37.1 C  SpO2: 98% 95%    Last Pain:  Vitals:   01/06/20 1300  TempSrc: Oral  PainSc:                  Detric Scalisi

## 2020-01-06 NOTE — Op Note (Signed)
[facility] Patient Name: Denise King Procedure Date : 01/06/2020 MRN: [id number] Attending MD: Docia Chuck. Henrene Pastor , MD Date of Birth: 11-09-1951 CSN: [id number] Age: 69 Admit Type: Inpatient Procedure:                Upper GI endoscopy with control of bleeding Indications:              Melena. The patient had ERCP with sphincterotomy                            December 25, 2019 Providers:                Docia Chuck. Henrene Pastor, MD, Vista Lawman, RN, Lina Sar,                            Technician, Raphael Gibney, CRNA Referring MD:             [facility] Medicines:                Monitored Anesthesia Care Complications:            No immediate complications. Estimated Blood Loss:     Estimated blood loss: none. Procedure:                Pre-Anesthesia Assessment:                           - Prior to the procedure, a History and Physical                            was performed, and patient medications and                            allergies were reviewed. The patient's tolerance of                            previous anesthesia was also reviewed. The risks                            and benefits of the procedure and the sedation                            options and risks were discussed with the patient.                            All questions were answered, and informed consent                            was obtained. Prior Anticoagulants: The patient has                            taken no previous anticoagulant or antiplatelet                            agents. ASA Grade Assessment: II - A patient with  mild systemic disease. After reviewing the risks                            and benefits, the patient was deemed in                            satisfactory condition to undergo the procedure.                           After obtaining informed consent, the endoscope was                            passed under direct vision. Throughout  the                            procedure, the patient's blood pressure, pulse, and                            oxygen saturations were monitored continuously. The                            [id number] Olympus gastroscope was                            introduced through the mouth, and advanced to the                            second part of duodenum. The [id number]                            Olympus duodenoscope was introduced through the                            mouth, and advanced to the second part of duodenum.                            The upper GI endoscopy was accomplished without                            difficulty. The patient tolerated the procedure                            well. Scope In: Scope Out: Findings:      NOTE: The examination was initiated with standard upper endoscope.       Subsequently changed to side-viewing endoscope to ensure thorough       examination of the entire upper GI tract.      The esophagus was normal. The stomach was normal.      One non-bleeding duodenal ulcer with pigmented material was found in the       ampulla, at the apex of prior sphincterotomy site. Area was successfully       injected with 1.5 mL of a 1:10,000 solution of epinephrine for       hemostasis. Coagulation for hemostasis using bipolar probe was  successful. See images.      The examined duodenum was otherwise normal.      The cardia and gastric fundus were normal on retroflexion. Impression:               1. Post sphincterotomy bleeding. Status post                            endoscopic hemostatic therapy                           2. Otherwise unremarkable EGD. Recommendation:           1. Regular diet                           2. Please observe overnight                           3. If doing well overnight without evidence of                            bleeding, then okay for discharge in a.m.                           I have reviewed this with the  patient and provided                            her a copy of her endoscopy report. Please call for                            questions or problems. We will sign off. Procedure Code(s):        --- Professional ---                           [phone], Esophagogastroduodenoscopy, flexible,                            transoral; with control of bleeding, any method Diagnosis Code(s):        --- Professional ---                           K26.9, Duodenal ulcer, unspecified as acute or                            chronic, without hemorrhage or perforation                           K92.1, Melena (includes Hematochezia) CPT copyright 2019 [employer]. All rights reserved. The codes documented in this report are preliminary and upon coder review may  be revised to meet current compliance requirements. Docia Chuck. Henrene Pastor, MD 01/06/2020 12:25:31 PM This report has been signed electronically. Number of Addenda: 0

## 2020-01-06 NOTE — Anesthesia Preprocedure Evaluation (Addendum)
Anesthesia Evaluation  Patient identified by MRN, date of birth, ID bandGeneral Assessment Comment: Awake, sitting up right, vomiting. Answering questions appropriately, but slowly  Reviewed: Allergy & Precautions, NPO status , Patient's Chart, lab work & pertinent test results  History of Anesthesia Complications Negative for: history of anesthetic complications  Airway Mallampati: II  TM Distance: >3 FB Neck ROM: Full    Dental no notable dental hx. (+) Dental Advisory Given   Pulmonary Current Smoker and Patient abstained from smoking.,    Pulmonary exam normal        Cardiovascular hypertension, Normal cardiovascular exam   '20 Cath - Mid RCA lesion is 50% stenosed. Prox Cx to Mid Cx lesion is 30% stenosed. Prox LAD to Mid LAD lesion is 50% stenosed. LV end diastolic pressure is low.  '20 TTE - EF 60-65%. Left ventricular diastolic Doppler parameters are consistent with impaired relaxation. No valvulopathy identified.    Neuro/Psych  Headaches, PSYCHIATRIC DISORDERS Anxiety Depression  Vertigo     GI/Hepatic GERD  ,(+)     substance abuse  ,  [name] Actively vomiting in preop    Endo/Other  negative endocrine ROS  Renal/GU negative Renal ROS     Musculoskeletal  (+) Arthritis , narcotic dependent  Abdominal   Peds  Hematology negative hematology ROS (+)   Anesthesia Other Findings Covid neg 12/26   Reproductive/Obstetrics                            Anesthesia Physical  Anesthesia Plan  ASA: III  Anesthesia Plan: MAC   Post-op Pain Management:    Induction:   PONV Risk Score and Plan: Treatment may vary due to age or medical condition, Ondansetron and Propofol infusion  Airway Management Planned: Natural Airway and Simple Face Mask  Additional Equipment: None  Intra-op Plan:   Post-operative Plan: Extubation in OR  Informed Consent: I have reviewed the  patients History and Physical, chart, labs and discussed the procedure including the risks, benefits and alternatives for the proposed anesthesia with the patient or authorized representative who has indicated his/her understanding and acceptance.     Dental advisory given  Plan Discussed with: CRNA, Anesthesiologist and Surgeon  Anesthesia Plan Comments:        Anesthesia Quick Evaluation

## 2020-01-06 NOTE — Interval H&P Note (Signed)
History and Physical Interval Note:  01/06/2020 10:27 AM  Denise King  has presented today for surgery, with the diagnosis of Melenic stool.  Sphincterotomy at ERCP (Endoscopicretrogradecholangiopancreatography) 01/24/2019.  2 g drop in Hb over 5 days..  The various methods of treatment have been discussed with the patient and family. After consideration of risks, benefits and other options for treatment, the patient has consented to  Procedure(s): ESOPHAGOGASTRODUODENOSCOPY (EGD) WITH PROPOFOL (N/A) as a surgical intervention.  The patient's history has been reviewed, patient examined, no change in status, stable for surgery.  I have reviewed the patient's chart and labs.  Questions were answered to the patient's satisfaction.     [name]

## 2020-01-06 NOTE — Transfer of Care (Signed)
Immediate Anesthesia Transfer of Care Note  Patient: ALISHAH SCHULTE  Procedure(s) Performed: ESOPHAGOGASTRODUODENOSCOPY (EGD) WITH PROPOFOL (N/A ) HOT HEMOSTASIS (ARGON PLASMA COAGULATION/BICAP) (N/A ) SCLEROTHERAPY  Patient Location: PACU and Endoscopy Unit  Anesthesia Type:MAC  Level of Consciousness: drowsy  Airway & Oxygen Therapy: Patient Spontanous Breathing and Patient connected to nasal cannula oxygen  Post-op Assessment: Report given to RN, Post -op Vital signs reviewed and stable and Patient moving all extremities  Post vital signs: Reviewed and stable  Last Vitals:  Vitals Value Taken Time  BP 153/64 01/06/20 1208  Temp 36.6 C 01/06/20 1208  Pulse 82 01/06/20 1214  Resp 16 01/06/20 1214  SpO2 95 % 01/06/20 1214  Vitals shown include unvalidated device data.  Last Pain:  Vitals:   01/06/20 1208  TempSrc: Oral  PainSc: 9       Patients Stated Pain Goal: 2 (39/53/20 2334)  Complications: No apparent anesthesia complications

## 2020-01-07 LAB — CBC WITH DIFFERENTIAL/PLATELET
Abs Immature Granulocytes: 0.01 10*3/uL (ref 0.00–0.07)
Basophils Absolute: 0.1 10*3/uL (ref 0.0–0.1)
Basophils Relative: 1 %
Eosinophils Absolute: 0.1 10*3/uL (ref 0.0–0.5)
Eosinophils Relative: 2 %
HCT: 37.3 % (ref 36.0–46.0)
Hemoglobin: 11.7 g/dL — ABNORMAL LOW (ref 12.0–15.0)
Immature Granulocytes: 0 %
Lymphocytes Relative: 44 %
Lymphs Abs: 2.7 10*3/uL (ref 0.7–4.0)
MCH: 31.2 pg (ref 26.0–34.0)
MCHC: 31.4 g/dL (ref 30.0–36.0)
MCV: 99.5 fL (ref 80.0–100.0)
Monocytes Absolute: 0.7 10*3/uL (ref 0.1–1.0)
Monocytes Relative: 11 %
Neutro Abs: 2.6 10*3/uL (ref 1.7–7.7)
Neutrophils Relative %: 42 %
Platelets: 191 10*3/uL (ref 150–400)
RBC: 3.75 MIL/uL — ABNORMAL LOW (ref 3.87–5.11)
RDW: 12.4 % (ref 11.5–15.5)
WBC: 6.2 10*3/uL (ref 4.0–10.5)
nRBC: 0 % (ref 0.0–0.2)

## 2020-01-07 LAB — BASIC METABOLIC PANEL
Anion gap: 6 (ref 5–15)
BUN: 8 mg/dL (ref 8–23)
CO2: 30 mmol/L (ref 22–32)
Calcium: 8.4 mg/dL — ABNORMAL LOW (ref 8.9–10.3)
Chloride: 101 mmol/L (ref 98–111)
Creatinine, Ser: 0.76 mg/dL (ref 0.44–1.00)
GFR calc Af Amer: >60 mL/min (ref >60)
GFR calc non Af Amer: >60 mL/min (ref >60)
Glucose, Bld: 98 mg/dL (ref 70–99)
Potassium: 4 mmol/L (ref 3.5–5.1)
Sodium: 137 mmol/L (ref 135–145)

## 2020-01-07 LAB — HEMOGLOBIN AND HEMATOCRIT, BLOOD
HCT: 37.4 % (ref 36.0–46.0)
Hemoglobin: 11.9 g/dL — ABNORMAL LOW (ref 12.0–15.0)

## 2020-01-07 NOTE — Progress Notes (Signed)
PROGRESS NOTE  Denise King [id number] DOB: 1951/10/04 DOA: 12/23/2019 PCP: Biagio Borg, MD  Brief History   69 year old female with past medical history of anxiety, alcohol abuse, bipolar disorder, hypertension and chronic pain syndrome/on chronic narcotics presented to the emergency room on 12/26 with complaints of nausea and vomiting and abdominal pain who was admitted for acute cholecystitis/common bile duct stones. General surgery and GI were consulted and patient went for ERCP on 12/28. Postprocedure, she became hypoxic and hypercarbic and transferred to the ICU on BiPAP. She was able to be weaned off the BiPAP and is currently on 4 L nasal cannula.Surgery has been following up and plans to do cholecystectomy laparoscopically as outpatient following stabilization for breathing.  The patient is slowly being weaned off of supplementary O2 as possible. Today the patient is seen sitting in bed with O2 off. She is maintaining SaO2 in the 90's for the most part with occasional dips into the 80's. Will qualify for O2 with ambulation tomorrow before discharge.  Patient had hypotension that did not immediately respond to IV fluid bolus today prior to discharge. Discharge was held and she will be given another bolus of IV fluid.  On the evening of 01/04/2020 the patient had a black stool after an episode of hypotension earlier in the day. This morning the hemoglobin was 12.6, 1.5 gm less than it had been 5 days earlier. The patient has undergone ERCP with sphincterotomy. There is concern that bleeding could be coming from this site. Her heparin has been stopped.   EGD was performed on the morning os 01/06/2020. It demonstrated a non-bleeding pigmented ulcer at the apex of the ampulla where sphincterotomy was performed. This was injected. Dr. Henrene Pastor recommended that we observe the patient overnight and discharge her to home in the morning if there is no evidence of further bleeding. She will  need to complete 72 hours of Q 8 hour IV protonix prior to discharge.  Consultants  . General Surgery . PCCM . Gastroenterology  Procedures  . EGD with injection of ulcer at apex of ampulla at site of prior sphincterotomy.  Antibiotics   Anti-infectives (From admission, onward)   Start     Dose/Rate Route Frequency Ordered Stop   12/25/19 0930  Ampicillin-Sulbactam (UNASYN) 3 g in sodium chloride 0.9 % 100 mL IVPB     3 g 200 mL/hr over 30 Minutes Intravenous  Once 12/25/19 0928 12/25/19 0958   12/23/19 2115  Ampicillin-Sulbactam (UNASYN) 3 g in sodium chloride 0.9 % 100 mL IVPB     3 g 200 mL/hr over 30 Minutes Intravenous Every 6 hours 12/23/19 2100     12/23/19 2103  valACYclovir (VALTREX) tablet 500 mg     500 mg Oral 2 times daily PRN 12/23/19 2104        Subjective  The patient is resting comfortably. No new complaints.  Objective   Vitals:  Vitals:   01/07/20 0438 01/07/20 1340  BP: 109/65 122/74  Pulse: 72 67  Resp: 16   Temp: 97.7 F (36.5 C) 98.6 F (37 C)  SpO2: 90% 93%   Exam:  Constitutional:  . The patient is awake, alert, and oriented x 3. No acute distress. Respiratory:  . No increased work of breathing. . No wheezes, rales, or rhonchi . No tactile fremitus Cardiovascular:  . Regular rate and rhythm . No murmurs, ectopy, or gallups. . No lateral PMI. No thrills. Abdomen:  . Abdomen is soft, non-tender, non-distended . No hernias,  masses, or organomegaly . Normoactive bowel sounds.  Musculoskeletal:  . No cyanosis, clubbing, or edema Skin:  . No rashes, lesions, ulcers . palpation of skin: no induration or nodules Neurologic:  . CN 2-12 intact . Sensation all 4 extremities intact Psychiatric:  . Mental status o Mood, affect appropriate o Orientation to person, place, time  . judgment and insight appear intact  I have personally reviewed the following:   Today's Data  . Vitals, bmp, cbc  Imaging  . CXR  Scheduled Meds: .  ARIPiprazole  2 mg Oral QHS  . busPIRone  10 mg Oral TID  . chlorhexidine  15 mL Mouth Rinse BID  . Chlorhexidine Gluconate Cloth  6 each Topical Daily  . cholecalciferol  2,000 Units Oral QPC lunch  . feeding supplement  1 Container Oral TID BM  . folic acid  1 mg Oral QPC lunch  . gabapentin  300 mg Oral TID  . mouth rinse  15 mL Mouth Rinse q12n4p  . morphine  60 mg Oral Q12H  . multivitamin with minerals  1 tablet Oral Daily  . nicotine  21 mg Transdermal Daily  . rosuvastatin  5 mg Oral Q M,W,F-1800  . sertraline  200 mg Oral QHS  . vitamin B-12  100 mcg Oral QPC lunch   Continuous Infusions: . ampicillin-sulbactam (UNASYN) IV 3 g (01/07/20 0843)  . pantoprozole (PROTONIX) infusion 8 mg/hr (01/06/20 0300)    Principal Problem:   Acute respiratory failure with hypoxia and hypercapnia (HCC) Active Problems:   CIGARETTE SMOKER   Chronic pain syndrome   Abdominal pain   Nausea & vomiting   Common bile duct stone   Acute cholecystitis   Abnormal MRI of abdomen   Cholelithiasis   Bipolar disorder (HCC)   Alcohol abuse   Anxiety   Acute on chronic diastolic CHF (congestive heart failure) (HCC)   Malnutrition of moderate degree   Duodenal ulcer hemorrhagic   LOS: 15 days   A & P    Acute hypoxic hypercarbic respiratory failure: Due to acute on chronic diastolic CHF. She is not on oxygen supplementation at baseline.Currently requiring 2.5 L to maintain O2 saturation greater than 92%. Continue bronchodilators and pulmonary toilet. Personally reviewed chest x-ray done on 12/25/2019 which showed bibasilar pulmonary infiltrates and bilateral pleural effusion. Continue diuretics and to wean O2 down. Today the patient is seen sitting in bed with O2 off. She is maintaining SaO2 in the 90's for the most part with occasional dips into the 80's. Will qualify for O2 with ambulation tomorrow before discharge.  Acute on chronic diastolic CHF: Elevated BNP greater than 800.Continue IV  diuretics Lasix 20 mg twice daily, will discharge to control blood pressures. Continue strict I's and O's and daily weight. Net I&O - 3.535.7 L. 0200 am dose of lasix has been discontinued to allow for better sleep. Continue to monitor electrolytes while on diuretics, urine output, and blood pressures. She will be discharged on a lower dose of oral lasix. Her blood pressure was too low this afternoon for discharge. Will give another bolus and re-evaluate in the morning.  Hypotension, melena, anemia: The patient had a melanotic stool last night after she had an episode of hypotension that led to the cancellation of her discharge yesterday. This morning this stool was followed by a drop in her hemoglobin from 14.1 5 days ago to 12.6 this morning. GI has been consulted again.EGD was performed on the morning os 01/06/2020. It demonstrated a non-bleeding pigmented  ulcer at the apex of the ampulla where sphincterotomy was performed. This was injected. The patient must complete 72 hours of iV protonix. Per Dr. Henrene Pastor the patient may then be discharged to home if there is no further evidence of bleeding.   Acute metabolic encephalopathy secondary to acute hypercarbic/hypoxic respiratory failure: Resolved. Pt has evaluated the patient and recommended Home health PT.   Acute cholecystitis/choledocholithiasis: Status post ERCP with stent placement. She is receiving IV Unasyn. She will need to follow up with general surgery as outpatient when her respiratory situation has improved.  Blood cultures x2 no growth to date. The patient will need to follow-up with general surgery outpatient for possible lap chole once breathing is stabilized.  Bipolar disorder: Continue home medications.  Alcohol use disorder: Stable at this time. No signs of withdrawal.  Chronic anxiety: Continue home medications.  Hypokalemia: Resolved. Monitor and supplement.  Hypomagnesemia: Resolved. Monitor.   I have seen and examined  this patient myself. I have spent 32 minutes in her evaluation and care.   DVT prophylaxis: SCD's Code Status:Full code Family Communication:I have discussed the patient in detail with her daugher, Benjamine Mola. All questions answered to the best of my ability.  Disposition Plan:Discharge to home tomorrow after completion of 72 hours of protonix if there is no evidence of ongoing bleeding.  Josphine Laffey, DO [facility] Direct contact: see [email or web address]  7PM-7AM contact night coverage as above 01/07/2020, 2:21 PM  LOS: 7 days

## 2020-01-07 NOTE — Plan of Care (Signed)

## 2020-01-07 NOTE — Progress Notes (Signed)
Pt. ambulated approximately 300 feet with front wheel walker. Pt. was able to ambulate without the use of oxygen. Her O2 sats never went below 91% and pt. tolerated the ambulation well.

## 2020-01-08 LAB — CBC
HCT: 37.3 % (ref 36.0–46.0)
Hemoglobin: 11.7 g/dL — ABNORMAL LOW (ref 12.0–15.0)
MCH: 31.5 pg (ref 26.0–34.0)
MCHC: 31.4 g/dL (ref 30.0–36.0)
MCV: 100.3 fL — ABNORMAL HIGH (ref 80.0–100.0)
Platelets: 199 10*3/uL (ref 150–400)
RBC: 3.72 MIL/uL — ABNORMAL LOW (ref 3.87–5.11)
RDW: 12.4 % (ref 11.5–15.5)
WBC: 6 10*3/uL (ref 4.0–10.5)
nRBC: 0 % (ref 0.0–0.2)

## 2020-01-08 MED ORDER — PANTOPRAZOLE SODIUM 40 MG PO TBEC: 40.0000 mg | DELAYED_RELEASE_TABLET | Freq: Two times a day (BID) | ORAL | 0 refills | Status: DC

## 2020-01-08 MED ORDER — ALPRAZOLAM 0.25 MG PO TABS: 0.2500 mg | ORAL_TABLET | Freq: Three times a day (TID) | ORAL | 0 refills | Status: DC | PRN

## 2020-01-08 NOTE — TOC Progression Note (Signed)
Transition of Care [facility]) - Progression Note    Patient Details  Name: Denise King MRN: [id number] Date of Birth: October 04, 1951  Transition of [facility]) CM/SW Contact  Jacalyn Lefevre Edson Snowball, RN Phone Number: 01/08/2020, 10:25 AM  Clinical Narrative:     Tommi Rumps with [facility] aware of discharge today.   [facility] brought portable oxygen tank to room last week.  Expected Discharge Plan: [place] Barriers to Discharge: Continued Medical Work up  Expected Discharge Plan and Services Expected Discharge Plan: [place]   Discharge Planning Services: CM Consult Post Acute Care Choice: [place] arrangements for the past 2 months: Single Family Home Expected Discharge Date: 01/08/20               DME Arranged: N/A         HH Arranged: PT HH Agency: [facility] Date [place]: 12/27/19 Time [place]: [id number] Representative spoke with at [place]: [place] Interventions    Readmission Risk Interventions No flowsheet data found.

## 2020-01-08 NOTE — Progress Notes (Signed)
Physical Therapy Treatment Patient Details Name: Denise King MRN: [id number] DOB: 07-Dec-1951 Today's Date: 01/08/2020    History of Present Illness Pt is a 69 y/o female with past medical history significant for irritable bowel syndrome, chronic pain syndrome, hypertension, GERD, alcohol, diverticulosis, anxiety and anemia.  Patient was admitted with gallstone cholecystitis. Pt underwent ERCP on 12/28 with subsequent hypercarbic resp failure.    PT Comments    Pt moving well with ability to perform hall ambulation without RW and increased ability with stairs. Pt educated for HEP and encouraged to continue to perform at home for strengthening and ease with stairs and gait. Pt eager for D/C without concern for mobility for return home.     Follow Up Recommendations  Home health PT;Supervision/Assistance - 24 hour     Equipment Recommendations  None recommended by PT    Recommendations for Other Services       Precautions / Restrictions Precautions Precautions: Fall Restrictions Weight Bearing Restrictions: No    Mobility  Bed Mobility Overal bed mobility: Modified Independent             General bed mobility comments: HOB 20 degrees  Transfers       Sit to Stand: Modified independent (Device/Increase time)         General transfer comment: pt able to stand from bed and to chair  Ambulation/Gait Ambulation/Gait assistance: Supervision Gait Distance (Feet): 250 Feet Assistive device: None Gait Pattern/deviations: Step-through pattern;Trunk flexed;Decreased stride length   Gait velocity interpretation: 1.31 - 2.62 ft/sec, indicative of limited community ambulator General Gait Details: pt with good stability with narrow BOS and short strides, cues for posture   Stairs Stairs: Yes Stairs assistance: Min guard Stair Management: Step to pattern;Forwards;One rail Right Number of Stairs: 3 General stair comments: pt able to ascend/descend forward this  session with one rail   Wheelchair Mobility    Modified Rankin (Stroke Patients Only)       Balance Overall balance assessment: Needs assistance   Sitting balance-Leahy Scale: Good       Standing balance-Leahy Scale: Good                              Cognition Arousal/Alertness: Awake/alert Behavior During Therapy: WFL for tasks assessed/performed Overall Cognitive Status: Within Functional Limits for tasks assessed                                        Exercises General Exercises - Lower Extremity Long Arc Quad: AROM;Both;Seated;20 reps Hip ABduction/ADduction: AROM;Both;Seated;20 reps Hip Flexion/Marching: AROM;Both;Seated;20 reps    General Comments        Pertinent Vitals/Pain Faces Pain Scale: Hurts little more Pain Location: back Pain Descriptors / Indicators: Aching Pain Intervention(s): Limited activity within patient's tolerance;Monitored during session;Repositioned    Home Living                      Prior Function            PT Goals (current goals can now be found in the care plan section) Progress towards PT goals: Progressing toward goals    Frequency           PT Plan Current plan remains appropriate    Co-evaluation              AM-PAC PT "  6 Clicks" Mobility   Outcome Measure  Help needed turning from your back to your side while in a flat bed without using bedrails?: None Help needed moving from lying on your back to sitting on the side of a flat bed without using bedrails?: A Little Help needed moving to and from a bed to a chair (including a wheelchair)?: A Little Help needed standing up from a chair using your arms (e.g., wheelchair or bedside chair)?: None Help needed to walk in hospital room?: A Little Help needed climbing 3-5 steps with a railing? : A Little 6 Click Score: 20    End of Session   Activity Tolerance: Patient tolerated treatment well Patient left: in  chair;with call bell/phone within reach Nurse Communication: Mobility status PT Visit Diagnosis: Muscle weakness (generalized) (M62.81);Other abnormalities of gait and mobility (R26.89)     Time: [id number] PT Time Calculation (min) (ACUTE ONLY): 11 min  Charges:  $Gait Training: 8-22 mins                     Bayard Males, PT Acute Rehabilitation Services Pager: [phone] Office: [place] 01/08/2020, 11:22 AM

## 2020-01-08 NOTE — Discharge Summary (Signed)
Physician Discharge Summary  Denise King [id number] DOB: April 26, 1951 DOA: 12/23/2019  PCP: Biagio Borg, MD  Admit date: 12/23/2019 Discharge date: 01/08/2020  Recommendations for Outpatient Follow-up:  1. Have CBC drawn on 01/12/2020. Report results to PCP. 2. Follow up with PCP in 7-10 days. 3. Follow up with general surgery in 2-4 weeks for cholecystitis. 4. Follow up with gastroenterology as directed.  Follow-up Information    Gatha Mayer, MD Follow up on 01/30/2020.   Specialty: Gastroenterology Why: at 10:30am.  Contact information: [address] [place] [address] [phone]        Care, [facility] Follow up.   Specialty: Home Health Services Contact information: [place] [id number] [phone]        Surgery, [place] Follow up.   Specialty: General Surgery Why: Call for follow up and gallbladder surgery after you are off Oxygen at home. Contact information: [place] [address] [phone]          Discharge Diagnoses: Principal diagnosis is #1 1. GI Bleed with bleeding from sphincterotomy site 2. Anemia - symptomatic with hypotension 3. Acute hypoxic and hypercarbic respiratory failure 4. Acute on chronic diastolic CHG 5. Choledocholithiasis 6. Cholecystitis 7. Bipolar disorder 8. Anxiety  Discharge Condition: Fair  Disposition: Home  Diet recommendation: Heart healthy  Filed Weights   01/06/20 0420 01/07/20 0500 01/08/20 0500  Weight: 53 kg 53.3 kg 53.6 kg    History of present illness:  Denise King is a 69 y.o. female with medical history significant of chronic pain syndrome, [name]. Patient has had chronic diarrhea for several months with LLQ abd pain that she associates with her [name]. For the past 24H however she has had [name] and periumbilical / epigastric abd pain that is new. Patient has had exposure to COVID-19 positive family member  about 10 days ago. Vomiting is non-bloody, no melena, no CP, syncope. Does complain of cough and SOB at time of transport but satting >90% on RA.  In the emergency room the patient had oxygen saturations of 100% on 2L [place] for comfort.  CT chest is neg for PE, shows B basilar PNA vs actelectasis. WBC nl, no SIRS. CT abd pelvis shows 48mm CBD, cholelithiasis, and flow void in CBD suspicious for duct stone. [place] RUQ shows gallstones in gallbladder with pericholecystic fluid, no Murphy sign, 10.35mm duct, equivocal findings for acute cholecystitis. LFTs are normal.  [facility] were consulted to admit the patient for further evaluation and management.  Hospital Course: 69 year old female with past medical history of anxiety, alcohol abuse, bipolar disorder, hypertension and chronic pain syndrome/on chronic narcotics presented to the emergency room on 12/26 with complaints of nausea and vomiting and abdominal pain who was admitted for acute cholecystitis/common bile duct stones. General surgery and GI were consulted and patient went for ERCP on 12/28. Postprocedure, she became hypoxic and hypercarbic and transferred to the ICU on BiPAP. She was able to be weaned off the BiPAP and is currently on 4 L nasal cannula.Surgery has been following up and plans to do cholecystectomy laparoscopically as outpatient following stabilization for breathing.  The patient is slowly being weaned off of supplementary O2 as possible. Today the patient is seen sitting in bed with O2 off. She is maintaining SaO2 in the 90's for the most part with occasional dips into the 80's. Will qualify for O2 with ambulation tomorrow before discharge.  Patient had hypotension  that did not immediately respond to IV fluid bolus today prior to discharge. Discharge was held and she will be given another bolus of IV fluid.  On the evening of 01/04/2020 the patient had a black stool after an episode of hypotension earlier in the  day. This morning the hemoglobin was 12.6, 1.5 gm less than it had been 5 days earlier. The patient has undergone ERCP with sphincterotomy. There is concern that bleeding could be coming from this site. Her heparin has been stopped.   EGD was performed on the morning os 01/06/2020. It demonstrated a non-bleeding pigmented ulcer at the apex of the ampulla where sphincterotomy was performed. This was injected. Dr. Henrene Pastor recommended that we observe the patient overnight and discharge her to home in the morning if there is no evidence of further bleeding. She will need to complete 72 hours of Q 8 hour IV protonix prior to discharge.  Today's assessment: S: The patient is resting comfortably. No further melena or hemoatochezia. No new complaints. O: Vitals:  Vitals:   01/07/20 1340 01/08/20 0508  BP: 122/74 128/68  Pulse: 67 74  Resp:  15  Temp: 98.6 F (37 C) 99.2 F (37.3 C)  SpO2: 93% 91%   Constitutional:   The patient is awake, alert, and oriented x 3. No acute distress. Respiratory:   No increased work of breathing.  No wheezes, rales, or rhonchi  No tactile fremitus Cardiovascular:   Regular rate and rhythm  No murmurs, ectopy, or gallups.  No lateral PMI. No thrills. Abdomen:   Abdomen is soft, non-tender, non-distended  No hernias, masses, or organomegaly  Normoactive bowel sounds.  Musculoskeletal:   No cyanosis, clubbing, or edema Skin:   No rashes, lesions, ulcers  palpation of skin: no induration or nodules Neurologic:   CN 2-12 intact  Sensation all 4 extremities intact Psychiatric:   Mental status ? Mood, affect appropriate ? Orientation to person, place, time   judgment and insight appear intact  Discharge Instructions  Discharge Instructions    Activity as tolerated - No restrictions   Complete by: As directed    Activity as tolerated - No restrictions   Complete by: As directed    Activity as tolerated - No restrictions   Complete by:  As directed    Call MD for:  difficulty breathing, headache or visual disturbances   Complete by: As directed    Call MD for:  difficulty breathing, headache or visual disturbances   Complete by: As directed    Call MD for:  persistant dizziness or light-headedness   Complete by: As directed    Call MD for:  persistant nausea and vomiting   Complete by: As directed    Call MD for:  persistant nausea and vomiting   Complete by: As directed    Call MD for:  persistant nausea and vomiting   Complete by: As directed    Call MD for:  severe uncontrolled pain   Complete by: As directed    Call MD for:  severe uncontrolled pain   Complete by: As directed    Call MD for:  severe uncontrolled pain   Complete by: As directed    Diet - low sodium heart healthy   Complete by: As directed    Discharge instructions   Complete by: As directed    Follow up with PCP in 7-10 days after discharge. Follow up with Pulmonology as outpatient. Follow up with general surgery as outpatient. Home health PT/OT.  DME Oxygen 2L by [place] continuous. Have BMP drawn on 01/08/2020 and reported to PCP.   Discharge instructions   Complete by: As directed    Have CBC drawn on 01/12/2020. Report results to PCP. Follow up with PCP in 7-10 days. Follow up with general surgery in 2-4 weeks for cholecystitis. Follow up with gastroenterology as directed.   Increase activity slowly   Complete by: As directed    Increase activity slowly   Complete by: As directed    Increase activity slowly   Complete by: As directed      Allergies as of 01/08/2020      Reactions   Atorvastatin Nausea And Vomiting   Levofloxacin Other (See Comments)   Reaction: achilles tendon pain   Omnicef [cefdinir] Diarrhea   Uncontrollable diarrhea   Trazodone And Nefazodone Other (See Comments)   "Felt like I was going to faint"   Sulfa Antibiotics Rash   Sulfonamide Derivatives Rash      Medication List    STOP taking these medications    aspirin 81 MG chewable tablet   diphenhydramine-acetaminophen 25-500 MG Tabs tablet Commonly known as: TYLENOL PM   doxylamine (Sleep) 25 MG tablet Commonly known as: UNISOM   hydrOXYzine 25 MG capsule Commonly known as: VISTARIL   triamcinolone cream 0.1 % Commonly known as: KENALOG     TAKE these medications   ALPRAZolam 0.25 MG tablet Commonly known as: XANAX Take 1 tablet (0.25 mg total) by mouth 3 (three) times daily as needed for anxiety.   ARIPiprazole 2 MG tablet Commonly known as: ABILIFY Take 1 tablet (2 mg total) by mouth daily. What changed: when to take this   busPIRone 10 MG tablet Commonly known as: BUSPAR Take 1 tablet (10 mg total) by mouth 3 (three) times daily.   cholestyramine 4 GM/DOSE powder Commonly known as: QUESTRAN Take 1 packet (4 g total) by mouth 2 (two) times daily with a meal. What changed:   when to take this  reasons to take this   denosumab 60 MG/ML Sosy injection Commonly known as: PROLIA Inject 60 mg into the skin every 6 (six) months.   folic acid 1 MG tablet Commonly known as: FOLVITE Take 1 tablet (1 mg total) by mouth daily. What changed: when to take this Notes to patient: tomorrow   furosemide 20 MG tablet Commonly known as: Lasix Take 1 tablet (20 mg total) by mouth daily. Notes to patient: tomorrow   gabapentin 300 MG capsule Commonly known as: NEURONTIN Take 300 mg by mouth 3 (three) times daily. Notes to patient: This afternoon at 4pm   hyoscyamine 0.125 MG Tbdp disintergrating tablet Commonly known as: ANASPAZ Place 0.125 mg under the tongue every 6 (six) hours as needed ([name] cramps).   morphine 60 MG 12 hr tablet Commonly known as: MS CONTIN Take 60 mg by mouth every 12 (twelve) hours. Notes to patient: tonight   multivitamin with minerals Tabs tablet Take 1 tablet by mouth daily. Notes to patient: tomorrow   nicotine 21 mg/24hr patch Commonly known as: NICODERM CQ - dosed in mg/24 hours Place  1 patch (21 mg total) onto the skin daily. Notes to patient: tomorrow   Oxycodone HCl 10 MG Tabs Take 10 mg by mouth 4 (four) times daily. Notes to patient: Per home dose   pantoprazole 40 MG tablet Commonly known as: PROTONIX Take 1 tablet (40 mg total) by mouth 2 (two) times daily. What changed: when to take this Notes to patient:  Per home dose   potassium chloride 10 MEQ tablet Commonly known as: KLOR-CON Take 1 tablet (10 mEq total) by mouth daily. Notes to patient: In am   rosuvastatin 5 MG tablet Commonly known as: CRESTOR Take 1 tablet (5 mg total) by mouth every Monday, Wednesday, and Friday at 6 PM. Notes to patient: Tonight at 6pm   sertraline 100 MG tablet Commonly known as: ZOLOFT Take 2 tablets (200 mg total) by mouth at bedtime. Notes to patient: tonight   valACYclovir 500 MG tablet Commonly known as: VALTREX Take 500 mg by mouth 2 (two) times daily as needed (flare ups).   vitamin B-12 100 MCG tablet Commonly known as: CYANOCOBALAMIN Take 1 tablet (100 mcg total) by mouth daily. What changed: when to take this Notes to patient: Today at 3pm   Vitamin D 50 MCG (2000 UT) tablet Take 2,000 Units by mouth daily after lunch. Notes to patient: Today at [place]  (From admission, onward)         Start     Ordered   01/04/20 1258  DME Oxygen  Once    Question Answer Comment  Length of Need Lifetime   Mode or (Route) Nasal cannula   Liters per Minute 2   Frequency Continuous (stationary and portable oxygen unit needed)   Oxygen conserving device Yes   Oxygen delivery system Gas      01/04/20 1300         Allergies  Allergen Reactions  . Atorvastatin Nausea And Vomiting  . Levofloxacin Other (See Comments)    Reaction: achilles tendon pain  . Omnicef [Cefdinir] Diarrhea    Uncontrollable diarrhea  . Trazodone And Nefazodone Other (See Comments)    "Felt like I was going to faint"  . Sulfa Antibiotics Rash   . Sulfonamide Derivatives Rash    The results of significant diagnostics from this hospitalization (including imaging, microbiology, ancillary and laboratory) are listed below for reference.    Significant Diagnostic Studies: CT Chest Wo Contrast  Result Date: 12/23/2019 CLINICAL DATA:  Shortness of breath and hypoxia. Abnormal chest x-ray. EXAM: CT CHEST WITHOUT CONTRAST TECHNIQUE: Multidetector CT imaging of the chest was performed following the standard protocol without IV contrast. COMPARISON:  One-view chest x-ray 01/22/2019 FINDINGS: Cardiovascular: The heart size is normal. Dense coronary artery calcifications are present. Atherosclerotic calcifications are present at the aortic arch and origins the great vessels. Mediastinum/Nodes: No significant mediastinal axillary, or hilar adenopathy is present. Lungs/Pleura: The lung fields are clear. There is no nodule or mass lesion. The nodular density corresponds with the anterior right rib. Bibasilar airspace disease is present, right greater than left. No effusions are present. There is no pneumothorax. Upper Abdomen: IV contrast is noted within the renal collecting systems. Limited imaging of the upper abdomen is otherwise unremarkable. Musculoskeletal: Leftward curvature is present in the upper thoracic spine. Vertebral body heights are maintained. Focal lytic or blastic lesions are present. IMPRESSION: 1. The nodular density on chest x-ray corresponds with the anterior right rib. 2. Bibasilar airspace disease, right greater than left. While this may represent atelectasis, infection is not excluded. 3. Coronary artery disease. 4. Aortic Atherosclerosis (ICD10-I70.0). Electronically Signed   By: San Morelle M.D.   On: 12/23/2019 19:22   CT ABDOMEN PELVIS W CONTRAST  Result Date: 12/23/2019 CLINICAL DATA:  Abdominal pain, LEFT side abdominal tenderness, vomiting since 0100 hours, low-grade fever, chills, history of ethanol  dependence,  GERD, hypertension, irritable bowel syndrome, smoker EXAM: CT ABDOMEN AND PELVIS WITH CONTRAST TECHNIQUE: Multidetector CT imaging of the abdomen and pelvis was performed using the standard protocol following bolus administration of intravenous contrast. Sagittal and coronal MPR images reconstructed from axial data set. CONTRAST:  146mL OMNIPAQUE IOHEXOL 300 MG/ML SOLN IV. No oral contrast. COMPARISON:  07/17/2017 FINDINGS: Lower chest: Subsegmental atelectasis dependently at both lung bases greater on RIGHT Hepatobiliary: Fatty infiltration of liver. Tiny nonspecific low-attenuation focus anterior LEFT lobe likely 5 mm cyst unchanged. Dependent density within gallbladder likely reflects cholelithiasis. Mild pericholecystic edema. Dilated CBD 11 mm diameter. Filling defect within distal CBD axial image 38 highly suspicious for choledocholithiasis. Questionable additional subtle intraluminal density on coronal images. Pancreas: Unremarkable Spleen: Normal appearance Adrenals/Urinary Tract: Adrenal glands normal appearance. Cortical thinning of both kidneys without renal mass or hydronephrosis. No urinary tract calcification or ureteral dilatation. Bladder unremarkable. Stomach/Bowel: Appendix surgically absent by history. Bowel wall thickening of transverse colon compatible with colitis. Remainder of colon unremarkable. Small bowel loops normal appearance. Wall thickening of gastric antrum with slight hyperenhancement of mucosa question gastritis; no discrete ulcer identified. Vascular/Lymphatic: Extensive atherosclerotic calcifications aorta, iliac arteries, femoral arteries, coronary arteries. Aorta normal caliber. No adenopathy. Reproductive: Atrophic uterus and ovaries Other: No free air or free fluid.  No hernia. Musculoskeletal: Osseous demineralization. Orthopedic hardware proximal RIGHT femur. Prior lumbar fusion L4-S1. Degenerative disc disease changes L3-L4. IMPRESSION: Bowel wall thickening of the  transverse colon consistent with colitis: consider inflammatory bowel disease and infection. Edema of gastric antrum with hyperenhancement of the mucosa raising question of gastritis, with no discrete ulcer identified. Cholelithiasis with pericholecystic fluid, cannot exclude acute cholecystitis. Extrahepatic biliary dilatation with at least 1 filling defect in distal CBD highly suspicious for choledocholithiasis; recommend correlation with LFTs and consider MRCP imaging. Fatty infiltration of liver. Bibasilar atelectasis Aortic Atherosclerosis (ICD10-I70.0). Electronically Signed   By: Lavonia Dana M.D.   On: 12/23/2019 14:28   MR 3D Recon At Scanner  Result Date: 12/24/2019 CLINICAL DATA:  Gallstones with biliary dilatation. EXAM: MRI ABDOMEN WITHOUT AND WITH CONTRAST (INCLUDING MRCP) TECHNIQUE: Multiplanar multisequence MR imaging of the abdomen was performed both before and after the administration of intravenous contrast. Heavily T2-weighted images of the biliary and pancreatic ducts were obtained, and three-dimensional MRCP images were rendered by post processing. CONTRAST:  60mL GADAVIST GADOBUTROL 1 MMOL/ML IV SOLN COMPARISON:  Abdomen/pelvis CT 12/23/2019. Abdominal ultrasound 12/23/2019. FINDINGS: Lower chest: Dependent atelectasis with tiny bilateral effusions. Hepatobiliary: Tiny cyst noted in the anterior left liver. No suspicious enhancing liver abnormality. Gallbladder is distended with small volume pericholecystic fluid. Several tiny/granular stones are visualized in the gallbladder lumen (image 12 coronal T2 series 12). Common bile duct in the head of pancreas measures up to 8-9 mm diameter with some dependent sludge and probable tiny dependent stones (coronal T2 image 9 of series 12). Common bile duct inserts low on the descending duodenum. Pancreas: Pancreas divisum ductal anatomy. No evidence for pancreatic mass lesion although assessment on postcontrast imaging is motion degraded. Spleen:  No  splenomegaly. No focal mass lesion. Adrenals/Urinary Tract: No adrenal nodule or mass. Tiny T2 hyperintense lesions in the cortex of both kidney show no appreciable enhancement after IV contrast administration, but assessment on postcontrast imaging is markedly motion degraded. Stomach/Bowel: Stomach is unremarkable. No gastric wall thickening. No evidence of outlet obstruction. Duodenum is normally positioned as is the ligament of Treitz. No small bowel or colonic dilatation within the visualized abdomen. Wall thickening  in the transverse colon described on the recent CT scan is not well demonstrated by MR. Vascular/Lymphatic: No abdominal aortic aneurysm. No abdominal lymphadenopathy. Other: Trace perihepatic fluid with trace fluid in the gallbladder fossa. Body wall edema noted. Musculoskeletal: Status post lumbosacral fusion. No suspicious marrow enhancement within the visualized bony anatomy. IMPRESSION: 1. Cholelithiasis with small volume pericholecystic fluid. Mild intra and extrahepatic biliary duct dilatation is associated with several apparent tiny granular stones in the common bile duct. 2. Pancreas divisum anatomy with major papilla low on the descending duodenum. 3. Probable tiny bilateral renal cysts incompletely characterized due to substantial motion degradation on postcontrast imaging today. Electronically Signed   By: Misty Stanley M.D.   On: 12/24/2019 10:43   DG CHEST PORT 1 VIEW  Result Date: 12/30/2019 CLINICAL DATA:  Fever, emesis. Acute respiratory failure and hypoxia. EXAM: PORTABLE CHEST 1 VIEW COMPARISON:  12/25/2019 FINDINGS: Mild cardiac enlargement. Moderate bilateral pleural effusions are identified. No interstitial edema. Subsegmental atelectasis identified in the right lower lobe. IMPRESSION: 1. Moderate bilateral pleural effusions. 2. Right lower lobe subsegmental atelectasis. Electronically Signed   By: Kerby Moors M.D.   On: 12/30/2019 10:19   DG CHEST PORT 1  VIEW  Result Date: 12/25/2019 CLINICAL DATA:  Acute respiratory failure, post ERCP, BIPAP EXAM: PORTABLE CHEST 1 VIEW COMPARISON:  CT chest 12/23/2019, chest radiograph 12/23/2019 FINDINGS: Heart size at the upper limits of normal, unchanged. Aortic atherosclerosis. Significantly increased from prior examinations, there is bibasilar opacity consistent with atelectasis and/or pneumonia with possible small effusions. No evidence of pneumothorax. Upper thoracic levocurvature. Overlying cardiac monitoring leads. IMPRESSION: Increasing bibasilar opacities consistent with atelectasis and/or pneumonia with possible small effusions. Electronically Signed   By: Kellie Simmering DO   On: 12/25/2019 11:10   DG Chest Portable 1 View  Result Date: 12/23/2019 CLINICAL DATA:  Cough and shortness of breath. EXAM: PORTABLE CHEST 1 VIEW COMPARISON:  05/04/2019 FINDINGS: Lungs are hyperexpanded. Interstitial markings are diffusely coarsened with chronic features. Mild patchy atelectasis or infiltrate noted at the bases. No pleural effusion. Nodular density noted right upper lobe, superimposed on the anterior end of the first rib. Cardiopericardial silhouette is at upper limits of normal for size. Bones are diffusely demineralized. Telemetry leads overlie the chest. IMPRESSION: 1. Emphysema with bibasilar atelectasis or infiltrate. 2. Nodular density in the right upper lobe may be related to the anterior right first rib and. Follow-up PA and lateral chest x-ray could be used to further assess. Alternatively, CT chest without contrast could be used to further evaluate as clinically warranted. Electronically Signed   By: Misty Stanley M.D.   On: 12/23/2019 13:43   DG ERCP sphincterotomy  Result Date: 12/25/2019 CLINICAL DATA:  ERCP for choledocholithiasis. EXAM: ERCP TECHNIQUE: Multiple spot images obtained with the fluoroscopic device and submitted for interpretation post-procedure. FLUOROSCOPY TIME:  5 minutes, 39 seconds  COMPARISON:  MRCP-12/24/2019 FINDINGS: Eight spot intraoperative fluoroscopic images of the right upper abdominal quadrant during ERCP are provided for review Initial image demonstrates an ERCP probe overlying the right upper abdominal quadrant with selective cannulation opacification of the common bile duct which appears mildly dilated. Subsequent images demonstrate insufflation of a balloon within the mid aspect of the CBD with subsequent biliary sweeping and presumed sphincterotomy. There is passage of contrast through the cystic duct with opacification of the gallbladder. There is minimal opacification of the intrahepatic biliary tree which appears mildly dilated. IMPRESSION: ERCP with biliary sweeping and presumed sphincterotomy as above. These images were submitted  for radiologic interpretation only. Please see the procedural report for the amount of contrast and the fluoroscopy time utilized. Electronically Signed   By: Sandi Mariscal M.D.   On: 12/25/2019 10:25   MR ABDOMEN MRCP W WO CONTAST  Result Date: 12/24/2019 CLINICAL DATA:  Gallstones with biliary dilatation. EXAM: MRI ABDOMEN WITHOUT AND WITH CONTRAST (INCLUDING MRCP) TECHNIQUE: Multiplanar multisequence MR imaging of the abdomen was performed both before and after the administration of intravenous contrast. Heavily T2-weighted images of the biliary and pancreatic ducts were obtained, and three-dimensional MRCP images were rendered by post processing. CONTRAST:  74mL GADAVIST GADOBUTROL 1 MMOL/ML IV SOLN COMPARISON:  Abdomen/pelvis CT 12/23/2019. Abdominal ultrasound 12/23/2019. FINDINGS: Lower chest: Dependent atelectasis with tiny bilateral effusions. Hepatobiliary: Tiny cyst noted in the anterior left liver. No suspicious enhancing liver abnormality. Gallbladder is distended with small volume pericholecystic fluid. Several tiny/granular stones are visualized in the gallbladder lumen (image 12 coronal T2 series 12). Common bile duct in the head  of pancreas measures up to 8-9 mm diameter with some dependent sludge and probable tiny dependent stones (coronal T2 image 9 of series 12). Common bile duct inserts low on the descending duodenum. Pancreas: Pancreas divisum ductal anatomy. No evidence for pancreatic mass lesion although assessment on postcontrast imaging is motion degraded. Spleen:  No splenomegaly. No focal mass lesion. Adrenals/Urinary Tract: No adrenal nodule or mass. Tiny T2 hyperintense lesions in the cortex of both kidney show no appreciable enhancement after IV contrast administration, but assessment on postcontrast imaging is markedly motion degraded. Stomach/Bowel: Stomach is unremarkable. No gastric wall thickening. No evidence of outlet obstruction. Duodenum is normally positioned as is the ligament of Treitz. No small bowel or colonic dilatation within the visualized abdomen. Wall thickening in the transverse colon described on the recent CT scan is not well demonstrated by MR. Vascular/Lymphatic: No abdominal aortic aneurysm. No abdominal lymphadenopathy. Other: Trace perihepatic fluid with trace fluid in the gallbladder fossa. Body wall edema noted. Musculoskeletal: Status post lumbosacral fusion. No suspicious marrow enhancement within the visualized bony anatomy. IMPRESSION: 1. Cholelithiasis with small volume pericholecystic fluid. Mild intra and extrahepatic biliary duct dilatation is associated with several apparent tiny granular stones in the common bile duct. 2. Pancreas divisum anatomy with major papilla low on the descending duodenum. 3. Probable tiny bilateral renal cysts incompletely characterized due to substantial motion degradation on postcontrast imaging today. Electronically Signed   By: Misty Stanley M.D.   On: 12/24/2019 10:43   US Abdomen Limited RUQ  Result Date: 12/23/2019 CLINICAL DATA:  Abdomen pain and vomiting since last night EXAM: ULTRASOUND ABDOMEN LIMITED RIGHT UPPER QUADRANT COMPARISON:  September 24, 2004 FINDINGS: Gallbladder: Gallstones are identified. There is pericholecystic fluid. No sonographic Percell Miller sign is reported by the ultrasound technologist. The gallbladder wall measures 3.4 mm. Common bile duct: Diameter: 10.7 mm Liver: No focal lesion identified. Within normal limits in parenchymal echogenicity. Portal vein is patent on color Doppler imaging with normal direction of blood flow towards the liver. Other: None. IMPRESSION: Gallstones identified in the gallbladder with pericholecystic fluid. No sonographic Percell Miller sign is reported by the ultrasound technologist. The findings are equivocal for acute cholecystitis. Electronically Signed   By: Abelardo Diesel M.D.   On: 12/23/2019 15:56    Microbiology: No results found for this or any previous visit (from the past 240 hour(s)).   Labs: Basic Metabolic Panel: Recent Labs  Lab 01/06/20 0225 01/07/20 0219  NA 139 137  K 3.7 4.0  CL 100  101  CO2 31 30  GLUCOSE 93 98  BUN 9 8  CREATININE 0.71 0.76  CALCIUM 8.7* 8.4*   Liver Function Tests: No results for input(s): AST, ALT, ALKPHOS, BILITOT, PROT, ALBUMIN in the last 168 hours. No results for input(s): LIPASE, AMYLASE in the last 168 hours. No results for input(s): AMMONIA in the last 168 hours. CBC: Recent Labs  Lab 01/05/20 1236 01/05/20 1711 01/06/20 0225 01/07/20 0219 01/07/20 1005 01/08/20 0131  WBC 7.0 7.5 6.3 6.2  --  6.0  NEUTROABS  --   --  2.5 2.6  --   --   HGB 12.3 13.5 12.7 11.7* 11.9* 11.7*  HCT 38.5 42.7 40.3 37.3 37.4 37.3  MCV 98.0 100.0 100.5* 99.5  --  100.3*  PLT 176 201 189 191  --  199   Cardiac Enzymes: No results for input(s): CKTOTAL, CKMB, CKMBINDEX, TROPONINI in the last 168 hours. BNP: BNP (last 3 results) Recent Labs    04/08/19 0238 12/28/19 1414  BNP 449.8* 893.7*    ProBNP (last 3 results) No results for input(s): PROBNP in the last 8760 hours.  CBG: No results for input(s): GLUCAP in the last 168 hours.  Principal  Problem:   Acute respiratory failure with hypoxia and hypercapnia (HCC) Active Problems:   CIGARETTE SMOKER   Chronic pain syndrome   Abdominal pain   Nausea & vomiting   Common bile duct stone   Acute cholecystitis   Abnormal MRI of abdomen   Cholelithiasis   Bipolar disorder (HCC)   Alcohol abuse   Anxiety   Acute on chronic diastolic CHF (congestive heart failure) (HCC)   Malnutrition of moderate degree   Duodenal ulcer hemorrhagic   Time coordinating discharge: 38 minutes.  Signed:        Kable Haywood, DO [facility]  01/08/2020, 3:20 PM

## 2020-01-08 NOTE — Plan of Care (Signed)

## 2020-01-08 NOTE — Plan of Care (Signed)

## 2020-01-08 NOTE — Progress Notes (Signed)
Karie Mainland to be D/C'd  per MD order. Discussed with the patient and all questions fully answered.  VSS, Skin clean, dry and intact without evidence of skin break down, no evidence of skin tears noted.  IV catheter discontinued intact. Site without signs and symptoms of complications. Dressing and pressure applied.  An After Visit Summary was printed and given to the patient. Patient received prescription.  D/c education completed with patient/family including follow up instructions, medication list, d/c activities limitations if indicated, with other d/c instructions as indicated by MD - patient able to verbalize understanding, all questions fully answered.   Patient instructed to return to ED, call 911, or call MD for any changes in condition.   Patient to be escorted via [place], and D/C home via private auto.

## 2020-01-09 ENCOUNTER — Telehealth: Payer: Self-pay | Admitting: *Deleted

## 2020-01-09 NOTE — Telephone Encounter (Signed)
Pt was on TCM report admitted 12/22/20 for further evaluation and management for LLQ abd pain and [name]. Pt had CT which showed acute cholecystitis/common bile duct stones. P underwent a ERCP 12/28 in which she became hypoxic and hypercarbic. Pt D/C 01/08/20, and have hosp f/u w/[facility] 01/30/20.Marland KitchenJohny Chess

## 2020-01-10 ENCOUNTER — Ambulatory Visit: Payer: Self-pay | Admitting: *Deleted

## 2020-01-10 DIAGNOSIS — I509 Heart failure, unspecified: Secondary | ICD-10-CM | POA: Diagnosis not present

## 2020-01-10 NOTE — Telephone Encounter (Signed)
Rec'd call from pt stating she is having some swelling in her feet/ankles may need to see MD sooner. Completed TCM below.Marland Kitchenlmb  Transition Care Management Follow-up Telephone Call   Date discharged?  01/08/20   How have you been since you were released from the hospital? Pt states when she first came home she was doing ok, but started having swelling in her feet/ankles yesterday. Been elevating them today but is still swollen   Do you understand why you were in the hospital? YES   Do you understand the discharge instructions? YES   Where were you discharged to? Home   Items Reviewed:  Medications reviewed: YES, Verified meds that was stop on d/c summary. Pt states she was not taking them prior going to hospital. Meds have been removed from list  Allergies reviewed: YES  Dietary changes reviewed: YES  Referrals reviewed: YES, have appt w/her gastroenterology 01/30/20   Functional Questionnaire:   Activities of Daily Living (ADLs):   She states she are independent in the following: ambulation, bathing and hygiene, feeding, continence, grooming, toileting and dressing States she doesn't require assistance    Any transportation issues/concerns?: NO   Any patient concerns? NO, just swelling in her feet/ankles   Confirmed importance and date/time of follow-up visits scheduled YES, appt 01/10/19  Provider Appointment booked with Dr. Jenny Reichmann  Confirmed with patient if condition begins to worsen call PCP or go to the ER.  Patient was given the office number and encouraged to call back with question or concerns.  : YES

## 2020-01-10 NOTE — Telephone Encounter (Signed)
Pt called with having swelling in her legs and ankles. She stated that she had just gotten out of the hospital on Monday and is suppose to have follow up appointment with her provider in 7-10 days.  This swelling, she noticed on yesterday. She had tried elevating her legs but felt like that was not helping.  She thought that they were getting worst after having them  being propped. She has a hx of heart failure. Denies swelling in her feet. No shortness of breath, fever, red streaks or other symptoms. She is advised to elevate her legs 20- mins at a time about her heart, 3 times a day. And to call for worsening symptoms. Per protocol, she needs to be seen within 3 days. Prefers to be seen sooner. Advised to speak with the scheduler regarding a hospital follow up appointment also.  Notify LB at [facility] for an appointment.  Call conference in with the practice.  Reason for Disposition . Swollen ankle joint  (Exception: area of localized swelling which is itchy)  Answer Assessment - Initial Assessment Questions 1. LOCATION: "Which joint is swollen?"     Ankle and on up 2. ONSET: "When did the swelling start?"     yesterday 3. SIZE: "How large is the swelling?"     Just puffiness 4. PAIN: "Is there any pain?" If so, ask: "How bad is it?" (Scale 1-10; or mild, moderate, severe)     uncomfortable when she walks 5. CAUSE: "What do you think caused the swollen joint?"     Not sure  6. OTHER SYMPTOMS: "Do you have any other symptoms?" (e.g., fever, chest pain, difficulty breathing, calf pain)     Muscle tightening in her calves when walking 7. PREGNANCY: "Is there any chance you are pregnant?" "When was your last menstrual period?"     n/a  Protocols used: ANKLE SWELLING-A-AH

## 2020-01-11 ENCOUNTER — Other Ambulatory Visit: Payer: Self-pay

## 2020-01-11 ENCOUNTER — Encounter: Payer: Self-pay | Admitting: Internal Medicine

## 2020-01-11 ENCOUNTER — Encounter: Payer: Self-pay | Admitting: *Deleted

## 2020-01-11 ENCOUNTER — Ambulatory Visit (INDEPENDENT_AMBULATORY_CARE_PROVIDER_SITE_OTHER): Payer: Medicare HMO | Admitting: Internal Medicine

## 2020-01-11 ENCOUNTER — Other Ambulatory Visit: Payer: Self-pay | Admitting: *Deleted

## 2020-01-11 VITALS — BP 116/76 | HR 83 | Temp 98.3°F | Ht 63.0 in | Wt 112.0 lb

## 2020-01-11 DIAGNOSIS — R739 Hyperglycemia, unspecified: Secondary | ICD-10-CM

## 2020-01-11 DIAGNOSIS — F411 Generalized anxiety disorder: Secondary | ICD-10-CM

## 2020-01-11 DIAGNOSIS — R69 Illness, unspecified: Secondary | ICD-10-CM | POA: Diagnosis not present

## 2020-01-11 DIAGNOSIS — K810 Acute cholecystitis: Secondary | ICD-10-CM

## 2020-01-11 DIAGNOSIS — K264 Chronic or unspecified duodenal ulcer with hemorrhage: Secondary | ICD-10-CM | POA: Diagnosis not present

## 2020-01-11 LAB — CBC WITH DIFFERENTIAL/PLATELET
Basophils Absolute: 0.1 10*3/uL (ref 0.0–0.1)
Basophils Relative: 0.8 % (ref 0.0–3.0)
Eosinophils Absolute: 0.1 10*3/uL (ref 0.0–0.7)
Eosinophils Relative: 0.8 % (ref 0.0–5.0)
HCT: 40.2 % (ref 36.0–46.0)
Hemoglobin: 13.1 g/dL (ref 12.0–15.0)
Lymphocytes Relative: 37.6 % (ref 12.0–46.0)
Lymphs Abs: 2.7 10*3/uL (ref 0.7–4.0)
MCHC: 32.6 g/dL (ref 30.0–36.0)
MCV: 97.1 fl (ref 78.0–100.0)
Monocytes Absolute: 0.8 10*3/uL (ref 0.1–1.0)
Monocytes Relative: 11.2 % (ref 3.0–12.0)
Neutro Abs: 3.6 10*3/uL (ref 1.4–7.7)
Neutrophils Relative %: 49.6 % (ref 43.0–77.0)
Platelets: 341 10*3/uL (ref 150.0–400.0)
RBC: 4.14 Mil/uL (ref 3.87–5.11)
RDW: 13.2 % (ref 11.5–15.5)
WBC: 7.2 10*3/uL (ref 4.0–10.5)

## 2020-01-11 LAB — BASIC METABOLIC PANEL
BUN: 8 mg/dL (ref 6–23)
CO2: 31 mEq/L (ref 19–32)
Calcium: 9 mg/dL (ref 8.4–10.5)
Chloride: 99 mEq/L (ref 96–112)
Creatinine, Ser: 0.75 mg/dL (ref 0.40–1.20)
GFR: 77.16 mL/min (ref >60.00)
Glucose, Bld: 88 mg/dL (ref 70–99)
Potassium: 3.7 mEq/L (ref 3.5–5.1)
Sodium: 138 mEq/L (ref 135–145)

## 2020-01-11 NOTE — Progress Notes (Signed)
Subjective:    Patient ID: Denise King, female    DOB: [birth year], 69 y.o.   MRN: [id number]  HPI  Here to f/u recent hospn Dec 26 - Jan 11 with: 1. GI Bleed with bleeding from sphincterotomy site 2. Anemia - symptomatic with hypotension 3. Acute hypoxic and hypercarbic respiratory failure 4. Acute on chronic diastolic CHG 5. Choledocholithiasis 6. Cholecystitis 7. Bipolar disorder 8. Anxiety 69 y.o.femalewith medical history significant ofchronic pain syndrome, [name]. Patient has had chronic diarrhea for several months with LLQ abd pain that she associates with her [name]. For the past 24H however she has had [name] and periumbilical / epigastric abd pain that is new  Vomiting was non-bloody, no melena, no CP, syncope.  was admitted for acute cholecystitis/common bile duct stones. General surgery and GI were consulted and patient went for ERCP on 12/28. Postprocedure, she became hypoxic and hypercarbic and transferred to the ICU on BiPAP. She was able to be weaned off the BiPAP and later o2. Surgery followed up and plans to do cholecystectomy laparoscopically as outpatient   EGD was performed on the morning os 01/06/2020. It demonstrated a non-bleeding pigmented ulcer at the apex of the ampulla where sphincterotomy was performed. This was injected.  [name]: 1. Have CBC drawn on 01/12/2020. Report results to PCP. 2. Follow up with PCP in 7-10 days. 3. Follow up with general surgery in 2-4 weeks for cholecystitis. 4. Follow up with gastroenterology as directed.  [name], dysphagia, n/v, bowel change or blood or black stools, Denies dizzy, sob, [name] , but has persistent pain ruq, [name]  Plans to see pulmonary as well and trying to quit smoking.  No new complaints  Past Medical History:  Diagnosis Date  . Acute cystitis   . Acute respiratory failure ([place])   . Allergy    as a child grew  out of them  . Anemia    pernicious anemia  . Anxiety   . Arthritis   . Back pain   . Blood transfusion   . CAP (community acquired pneumonia)   . Chronic pain syndrome   . Depression   . Diverticulosis of colon   . Dysthymia   . Esophagitis   . EtOH dependence ([place])   . Gastritis   . GERD (gastroesophageal reflux disease)   . H/O chest pain Dec. 2013   no work up done  . Hx of colonic polyps   . Hypertension   . Irritable bowel syndrome   . Osteopenia   . Osteoporosis   . Pneumonia 2019  . Polypharmacy   . Reflux esophagitis   . Right sided sciatica 12/22/2017  . Tobacco use disorder   . Unspecified chronic bronchitis ([place])   . Vertigo   . Vitamin B12 deficiency   . Wears glasses    Past Surgical History:  Procedure Laterality Date  . APPENDECTOMY    . BACK SURGERY  10-09   Dr. Patrice Paradise  . CARPAL TUNNEL RELEASE Right 08/14/2014   Procedure: RIGHT CARPAL TUNNEL RELEASE AND INJECT LEFT THUMB;  Surgeon: Daryll Brod, MD;  Location: [place];  Service: Orthopedics;  Laterality: Right;  . COLONOSCOPY    . ENDOSCOPIC RETROGRADE CHOLANGIOPANCREATOGRAPHY (ERCP) WITH PROPOFOL N/A 12/25/2019   Procedure: ENDOSCOPIC RETROGRADE CHOLANGIOPANCREATOGRAPHY (ERCP) WITH PROPOFOL;  Surgeon: Gatha Mayer, MD;  Location: [place];  Service: Gastroenterology;  Laterality: N/A;  .  ESOPHAGOGASTRODUODENOSCOPY (EGD) WITH PROPOFOL N/A 01/06/2020   Procedure: ESOPHAGOGASTRODUODENOSCOPY (EGD) WITH PROPOFOL;  Surgeon: Irene Shipper, MD;  Location: [facility] ENDOSCOPY;  Service: Endoscopy;  Laterality: N/A;  . HOT HEMOSTASIS N/A 01/06/2020   Procedure: HOT HEMOSTASIS (ARGON PLASMA COAGULATION/BICAP);  Surgeon: Irene Shipper, MD;  Location: [facility] ENDOSCOPY;  Service: Endoscopy;  Laterality: N/A;  . INTRAMEDULLARY (IM) NAIL INTERTROCHANTERIC Right 10/01/2018   Procedure: INTRAMEDULLARY (IM) NAIL INTERTROCHANTRIC;  Surgeon: Thornton Park, MD;  Location: [facility] ORS;  Service: Orthopedics;   Laterality: Right;  . LAPAROSCOPY N/A 02/21/2013   Procedure: LAPAROSCOPY OPERATIVE;  Surgeon: Margarette Asal, MD;  Location: [place] ORS;  Service: Gynecology;  Laterality: N/A;  REQUESTING 5MM SCOPE WITH CAMERA  . LEFT HEART CATH AND CORONARY ANGIOGRAPHY N/A 05/11/2019   Procedure: LEFT HEART CATH AND CORONARY ANGIOGRAPHY;  Surgeon: Sherren Mocha, MD;  Location: [place] CV LAB;  Service: Cardiovascular;  Laterality: N/A;  . REMOVAL OF STONES  12/25/2019   Procedure: Karlyn Agee;  Surgeon: Gatha Mayer, MD;  Location: [facility] ENDOSCOPY;  Service: Gastroenterology;;  . Clide Deutscher  01/06/2020   Procedure: Clide Deutscher;  Surgeon: Irene Shipper, MD;  Location: [facility] ENDOSCOPY;  Service: Endoscopy;;  . Joan Mayans  12/25/2019   Procedure: SPHINCTEROTOMY;  Surgeon: Gatha Mayer, MD;  Location: [facility];  Service: Gastroenterology;;  . TONSILLECTOMY    . TUBAL LIGATION    . UPPER GASTROINTESTINAL ENDOSCOPY      reports that she has been smoking cigarettes. She has a 30.00 pack-year smoking history. She has never used smokeless tobacco. She reports current alcohol use of about 3.0 standard drinks of alcohol per week. She reports that she does not use drugs. family history includes Alcohol abuse in her daughter; Bipolar disorder in her daughter; Colon cancer in her brother and father; Drug abuse in her daughter; Heart disease in her mother; Prostate cancer in her father; Skin cancer in her sister. Allergies  Allergen Reactions  . Atorvastatin Nausea And Vomiting  . Levofloxacin Other (See Comments)    Reaction: achilles tendon pain  . Omnicef [Cefdinir] Diarrhea    Uncontrollable diarrhea  . Trazodone And Nefazodone Other (See Comments)    "Felt like I was going to faint"  . Sulfa Antibiotics Rash  . Sulfonamide Derivatives Rash   Current Outpatient Medications on File Prior to Visit  Medication Sig Dispense Refill  . ALPRAZolam (XANAX) 0.25 MG tablet Take 1 tablet (0.25 mg total)  by mouth 3 (three) times daily as needed for anxiety. 30 tablet 0  . ARIPiprazole (ABILIFY) 2 MG tablet Take 1 tablet (2 mg total) by mouth daily. (Patient taking differently: Take 2 mg by mouth at bedtime. ) 30 tablet 1  . busPIRone (BUSPAR) 10 MG tablet Take 1 tablet (10 mg total) by mouth 3 (three) times daily. 90 tablet 5  . Cholecalciferol (VITAMIN D) 50 MCG (2000 UT) tablet Take 2,000 Units by mouth daily after lunch.    . cholestyramine (QUESTRAN) 4 GM/DOSE powder Take 1 packet (4 g total) by mouth 2 (two) times daily with a meal. (Patient taking differently: Take 4 g by mouth 2 (two) times daily as needed ([name] (take with meals)). ) 378 g 12  . denosumab (PROLIA) 60 MG/ML SOSY injection Inject 60 mg into the skin every 6 (six) months.    . folic acid (FOLVITE) 1 MG tablet Take 1 tablet (1 mg total) by mouth daily. (Patient taking differently: Take 1 mg by mouth daily after lunch. ) 90 tablet  3  . furosemide (LASIX) 20 MG tablet Take 1 tablet (20 mg total) by mouth daily. 30 tablet 11  . gabapentin (NEURONTIN) 300 MG capsule Take 300 mg by mouth 3 (three) times daily.    . hyoscyamine (ANASPAZ) 0.125 MG TBDP disintergrating tablet Place 0.125 mg under the tongue every 6 (six) hours as needed ([name] cramps).     . morphine (MS CONTIN) 60 MG 12 hr tablet Take 60 mg by mouth every 12 (twelve) hours.    . Multiple Vitamin (MULTIVITAMIN WITH MINERALS) TABS tablet Take 1 tablet by mouth daily. 30 tablet 0  . nicotine (NICODERM CQ - DOSED IN MG/24 HOURS) 21 mg/24hr patch Place 1 patch (21 mg total) onto the skin daily. 28 patch 0  . Oxycodone HCl 10 MG TABS Take 10 mg by mouth 4 (four) times daily.     . pantoprazole (PROTONIX) 40 MG tablet Take 1 tablet (40 mg total) by mouth 2 (two) times daily. 60 tablet 0  . potassium chloride (KLOR-CON) 10 MEQ tablet Take 1 tablet (10 mEq total) by mouth daily. 30 tablet 0  . rosuvastatin (CRESTOR) 5 MG tablet Take 1 tablet (5 mg total) by mouth every Monday,  Wednesday, and Friday at 6 PM. 36 tablet 3  . sertraline (ZOLOFT) 100 MG tablet Take 2 tablets (200 mg total) by mouth at bedtime. 180 tablet 0  . valACYclovir (VALTREX) 500 MG tablet Take 500 mg by mouth 2 (two) times daily as needed (flare ups).    . vitamin B-12 (CYANOCOBALAMIN) 100 MCG tablet Take 1 tablet (100 mcg total) by mouth daily. (Patient taking differently: Take 100 mcg by mouth daily after lunch. ) 90 tablet 3   Current Facility-Administered Medications on File Prior to Visit  Medication Dose Route Frequency Provider Last Rate Last Admin  . sodium chloride flush (NS) 0.9 % injection 3 mL  3 mL Intravenous Q12H Josue Hector, MD       Review of Systems  Constitutional: Negative for other unusual diaphoresis or sweats HENT: Negative for ear discharge or swelling Eyes: Negative for other worsening visual disturbances Respiratory: Negative for stridor or other swelling  Gastrointestinal: Negative for worsening distension or other blood Genitourinary: Negative for retention or other urinary change Musculoskeletal: Negative for other MSK pain or swelling Skin: Negative for color change or other new lesions Neurological: Negative for worsening tremors and other numbness  Psychiatric/Behavioral: Negative for worsening agitation or other fatigue All otherwise neg per pt     Objective:   Physical Exam BP 116/76   Pulse 83   Temp 98.3 F (36.8 C) (Oral)   Ht 5\' 3"  (1.6 m)   Wt 112 lb (50.8 kg)   SpO2 98%   BMI 19.84 kg/m  VS noted,  Constitutional: Pt appears in NAD HENT: Head: NCAT.  Right Ear: External ear normal.  Left Ear: External ear normal.  Eyes: . Pupils are equal, round, and reactive to light. Conjunctivae and EOM are normal Nose: without d/c or deformity Neck: Neck supple. Gross normal ROM Cardiovascular: Normal rate and regular rhythm.   Pulmonary/Chest: Effort normal and breath sounds without rales or wheezing.  Abd:  Soft, NT, ND, + BS, no  organomegaly Neurological: Pt is alert. At baseline orientation, motor grossly intact Skin: Skin is warm. No rashes, other new lesions, no LE edema Psychiatric: Pt behavior is normal without agitation  All otherwise neg per pt  Lab Results  Component Value Date   WBC 7.2 01/11/2020  HGB 13.1 01/11/2020   HCT 40.2 01/11/2020   PLT 341.0 01/11/2020   GLUCOSE 88 01/11/2020   CHOL 126 12/04/2019   TRIG 93 12/04/2019   HDL 62 12/04/2019   LDLDIRECT 102.4 05/24/2008   LDLCALC 47 12/04/2019   ALT 7 12/29/2019   AST 17 12/29/2019   NA 138 01/11/2020   K 3.7 01/11/2020   CL 99 01/11/2020   CREATININE 0.75 01/11/2020   BUN 8 01/11/2020   CO2 31 01/11/2020   TSH 0.499 04/08/2019   INR 0.9 09/10/2019   HGBA1C 5.2 10/01/2018       Assessment & Plan:

## 2020-01-11 NOTE — Patient Outreach (Signed)
Telephone outreach for red flag on Emmi discharge call.  Denise King reports she does have a few questions. She asks how long is she to take the lasix, because it is making me urinate all through the night?  Chart review reveals she is to take furosemide 20 mg daily. This is new to her. Did medication reconciliation. Patient was recently discharged from hospital and all medications have been reviewed. Outpatient Encounter Medications as of 01/11/2020  Medication Sig Note  . ALPRAZolam (XANAX) 0.25 MG tablet Take 1 tablet (0.25 mg total) by mouth 3 (three) times daily as needed for anxiety.   . ARIPiprazole (ABILIFY) 2 MG tablet Take 1 tablet (2 mg total) by mouth daily. (Patient taking differently: Take 2 mg by mouth at bedtime. )   . busPIRone (BUSPAR) 10 MG tablet Take 1 tablet (10 mg total) by mouth 3 (three) times daily.   . Cholecalciferol (VITAMIN D) 50 MCG (2000 UT) tablet Take 2,000 Units by mouth daily after lunch.   . cholestyramine (QUESTRAN) 4 GM/DOSE powder Take 1 packet (4 g total) by mouth 2 (two) times daily with a meal. (Patient taking differently: Take 4 g by mouth 2 (two) times daily as needed ([name] (take with meals)). )   . denosumab (PROLIA) 60 MG/ML SOSY injection Inject 60 mg into the skin every 6 (six) months. 09/10/2019: Next injection due December [id number]  . folic acid (FOLVITE) 1 MG tablet Take 1 tablet (1 mg total) by mouth daily. (Patient taking differently: Take 1 mg by mouth daily after lunch. )   . furosemide (LASIX) 20 MG tablet Take 1 tablet (20 mg total) by mouth daily.   Marland Kitchen gabapentin (NEURONTIN) 300 MG capsule Take 300 mg by mouth 3 (three) times daily.   . hyoscyamine (ANASPAZ) 0.125 MG TBDP disintergrating tablet Place 0.125 mg under the tongue every 6 (six) hours as needed ([name] cramps).    . morphine (MS CONTIN) 60 MG 12 hr tablet Take 60 mg by mouth every 12 (twelve) hours. 12/23/2019: #60/30 days filled 12/18/19 [facility] per PMP AWARE  . Multiple Vitamin  (MULTIVITAMIN WITH MINERALS) TABS tablet Take 1 tablet by mouth daily.   . nicotine (NICODERM CQ - DOSED IN MG/24 HOURS) 21 mg/24hr patch Place 1 patch (21 mg total) onto the skin daily.   . Oxycodone HCl 10 MG TABS Take 10 mg by mouth 4 (four) times daily.  12/23/2019: #60/15 days filled 12/19/19 [facility] per PMP AWARE  . pantoprazole (PROTONIX) 40 MG tablet Take 1 tablet (40 mg total) by mouth 2 (two) times daily.   . potassium chloride (KLOR-CON) 10 MEQ tablet Take 1 tablet (10 mEq total) by mouth daily.   . rosuvastatin (CRESTOR) 5 MG tablet Take 1 tablet (5 mg total) by mouth every Monday, Wednesday, and Friday at 6 PM.   . sertraline (ZOLOFT) 100 MG tablet Take 2 tablets (200 mg total) by mouth at bedtime.   . valACYclovir (VALTREX) 500 MG tablet Take 500 mg by mouth 2 (two) times daily as needed (flare ups).   . vitamin B-12 (CYANOCOBALAMIN) 100 MCG tablet Take 1 tablet (100 mcg total) by mouth daily. (Patient taking differently: Take 100 mcg by mouth daily after lunch. )    Facility-Administered Encounter Medications as of 01/11/2020  Medication  . sodium chloride flush (NS) 0.9 % injection 3 mL   Pt reports she takes the furosmiide in the am. She does have LE edema that gets worse during the day. She says  she keeps her feet up from time to time.  Advised to continue taking her furosemide, avoid salty foods, reduce added salt, wear some minimal to moderate compression stockings, elevate her legs as much as possible.  She asked about seeing a pulmonologist as recommended by her attending but she does not have one.  Messaged Dr. Benny Lennert regarding her recommendation. Of note pt is not using O2 at home, she has a pulse oximeter and her levels in in the 90's  We discussed smoking cessation also. She is wearing her nicotine patch but has smoked some since she has been home.  I will send pt a [facility] information brochure. I have invited her to call me if she has any other issues she would like to  discuss in the future.  Denise King. Denise Neither, MSN, GNP-BC Gerontological Nurse Practitioner [facility] Care Management [phone] .

## 2020-01-11 NOTE — Patient Instructions (Signed)
Please continue all other medications as before, and refills have been done if requested.  Please have the pharmacy call with any other refills you may need.  Please continue your efforts at being more active, low cholesterol diet, and weight control..  Please keep your appointments with your specialists as you may have planned  Please go to the LAB at the blood drawing area for the tests to be done  You will be contacted by phone if any changes need to be made immediately.  Otherwise, you will receive a letter about your results with an explanation, but please check with MyChart first.  Please remember to sign up for MyChart if you have not done so, as this will be important to you in the future with finding out test results, communicating by private email, and scheduling acute appointments online when needed.  Please return in 6 months, or sooner if needed

## 2020-01-13 ENCOUNTER — Encounter: Payer: Self-pay | Admitting: Internal Medicine

## 2020-01-13 DIAGNOSIS — J439 Emphysema, unspecified: Secondary | ICD-10-CM | POA: Diagnosis not present

## 2020-01-13 DIAGNOSIS — K9189 Other postprocedural complications and disorders of digestive system: Secondary | ICD-10-CM | POA: Diagnosis not present

## 2020-01-13 DIAGNOSIS — J9601 Acute respiratory failure with hypoxia: Secondary | ICD-10-CM | POA: Diagnosis not present

## 2020-01-13 DIAGNOSIS — K8062 Calculus of gallbladder and bile duct with acute cholecystitis without obstruction: Secondary | ICD-10-CM | POA: Diagnosis not present

## 2020-01-13 DIAGNOSIS — K91840 Postprocedural hemorrhage and hematoma of a digestive system organ or structure following a digestive system procedure: Secondary | ICD-10-CM | POA: Diagnosis not present

## 2020-01-13 DIAGNOSIS — I2510 Atherosclerotic heart disease of native coronary artery without angina pectoris: Secondary | ICD-10-CM | POA: Diagnosis not present

## 2020-01-13 DIAGNOSIS — I5033 Acute on chronic diastolic (congestive) heart failure: Secondary | ICD-10-CM | POA: Diagnosis not present

## 2020-01-13 DIAGNOSIS — D510 Vitamin B12 deficiency anemia due to intrinsic factor deficiency: Secondary | ICD-10-CM | POA: Diagnosis not present

## 2020-01-13 DIAGNOSIS — J9602 Acute respiratory failure with hypercapnia: Secondary | ICD-10-CM | POA: Diagnosis not present

## 2020-01-13 DIAGNOSIS — I110 Hypertensive heart disease with heart failure: Secondary | ICD-10-CM | POA: Diagnosis not present

## 2020-01-13 NOTE — Assessment & Plan Note (Signed)
stable overall by history and exam, recent data reviewed with pt, and pt to continue medical treatment as before,  to f/u any worsening symptoms or concerns, for cbc and GI f/u

## 2020-01-13 NOTE — Assessment & Plan Note (Signed)
stable overall by history and exam, recent data reviewed with pt, and pt to continue medical treatment as before,  to f/u any worsening symptoms or concerns  

## 2020-01-13 NOTE — Assessment & Plan Note (Signed)
With mild discomfort, for general surgury f/u

## 2020-01-15 DIAGNOSIS — I110 Hypertensive heart disease with heart failure: Secondary | ICD-10-CM | POA: Diagnosis not present

## 2020-01-15 DIAGNOSIS — I2510 Atherosclerotic heart disease of native coronary artery without angina pectoris: Secondary | ICD-10-CM | POA: Diagnosis not present

## 2020-01-15 DIAGNOSIS — K8062 Calculus of gallbladder and bile duct with acute cholecystitis without obstruction: Secondary | ICD-10-CM | POA: Diagnosis not present

## 2020-01-15 DIAGNOSIS — J9602 Acute respiratory failure with hypercapnia: Secondary | ICD-10-CM | POA: Diagnosis not present

## 2020-01-15 DIAGNOSIS — J439 Emphysema, unspecified: Secondary | ICD-10-CM | POA: Diagnosis not present

## 2020-01-15 DIAGNOSIS — K91840 Postprocedural hemorrhage and hematoma of a digestive system organ or structure following a digestive system procedure: Secondary | ICD-10-CM | POA: Diagnosis not present

## 2020-01-15 DIAGNOSIS — J9601 Acute respiratory failure with hypoxia: Secondary | ICD-10-CM | POA: Diagnosis not present

## 2020-01-15 DIAGNOSIS — D510 Vitamin B12 deficiency anemia due to intrinsic factor deficiency: Secondary | ICD-10-CM | POA: Diagnosis not present

## 2020-01-15 DIAGNOSIS — I5033 Acute on chronic diastolic (congestive) heart failure: Secondary | ICD-10-CM | POA: Diagnosis not present

## 2020-01-15 DIAGNOSIS — K9189 Other postprocedural complications and disorders of digestive system: Secondary | ICD-10-CM | POA: Diagnosis not present

## 2020-01-16 DIAGNOSIS — Z79891 Long term (current) use of opiate analgesic: Secondary | ICD-10-CM | POA: Diagnosis not present

## 2020-01-16 DIAGNOSIS — G894 Chronic pain syndrome: Secondary | ICD-10-CM | POA: Diagnosis not present

## 2020-01-16 DIAGNOSIS — M25551 Pain in right hip: Secondary | ICD-10-CM | POA: Diagnosis not present

## 2020-01-16 DIAGNOSIS — M961 Postlaminectomy syndrome, not elsewhere classified: Secondary | ICD-10-CM | POA: Diagnosis not present

## 2020-01-18 ENCOUNTER — Encounter: Payer: Self-pay | Admitting: Internal Medicine

## 2020-01-19 DIAGNOSIS — I2510 Atherosclerotic heart disease of native coronary artery without angina pectoris: Secondary | ICD-10-CM | POA: Diagnosis not present

## 2020-01-19 DIAGNOSIS — K91840 Postprocedural hemorrhage and hematoma of a digestive system organ or structure following a digestive system procedure: Secondary | ICD-10-CM | POA: Diagnosis not present

## 2020-01-19 DIAGNOSIS — I110 Hypertensive heart disease with heart failure: Secondary | ICD-10-CM | POA: Diagnosis not present

## 2020-01-19 DIAGNOSIS — I5033 Acute on chronic diastolic (congestive) heart failure: Secondary | ICD-10-CM | POA: Diagnosis not present

## 2020-01-19 DIAGNOSIS — K9189 Other postprocedural complications and disorders of digestive system: Secondary | ICD-10-CM | POA: Diagnosis not present

## 2020-01-19 DIAGNOSIS — J9602 Acute respiratory failure with hypercapnia: Secondary | ICD-10-CM | POA: Diagnosis not present

## 2020-01-19 DIAGNOSIS — J439 Emphysema, unspecified: Secondary | ICD-10-CM | POA: Diagnosis not present

## 2020-01-19 DIAGNOSIS — K8062 Calculus of gallbladder and bile duct with acute cholecystitis without obstruction: Secondary | ICD-10-CM | POA: Diagnosis not present

## 2020-01-19 DIAGNOSIS — J9601 Acute respiratory failure with hypoxia: Secondary | ICD-10-CM | POA: Diagnosis not present

## 2020-01-19 DIAGNOSIS — D510 Vitamin B12 deficiency anemia due to intrinsic factor deficiency: Secondary | ICD-10-CM | POA: Diagnosis not present

## 2020-01-19 NOTE — Progress Notes (Signed)
Virtual Visit via Video Note   This visit type was conducted due to national recommendations for restrictions regarding the COVID-19 Pandemic (e.g. social distancing) in an effort to limit this patient's exposure and mitigate transmission in our community.  Due to her co-morbid illnesses, this patient is at least at moderate risk for complications without adequate follow up.  This format is felt to be most appropriate for this patient at this time.  All issues noted in this document were discussed and addressed.  A limited physical exam was performed with this format.  Please refer to the patient's chart for her consent to telehealth for [facility].   Patient Location: Home Physician Location : Office  History of Present Illness:    Denise King is a 69 y.o. female first seen 04/07/19  with hospitalization for acute Viral syndrome and URI symptoms and neg COVID lab test.  Resp panel neg.    She had no history of cardiac disease admitted with 48 hours dry cough and dyspnea then had nausea and vomiting Has had weeks of diarrhea ? Due to [name]. After vomiting had mild central chest pressure  She is a ppd smoker active over 40 years.  Troponin 1.07 WBC mildly elevated 10.7  Troponin remained flat and echo without RWMA.  She was on IV heparin for 48 hours and neg PE on CTA of chest but + CAD.  In mLAD.  Lipids were normal in hospital at 71    Cardiac CTA done 05/09/19 calcium score 1304 99 th percentile with positive FFR CT in RCA/LAD Cath 05/11/19 with only 50% mid RCA and 50% prox/mid LAD Medical Rx recommended   Had PT and got hematoma right thigh and knee Has held aspirin  No motivation to quit smoking   Hospitalized 12/26-1/11/21 had PNA with gallstones  Had ERCP 12/25/19 post procedure had hypercarbic hypoxemia and required bipap. Plans for outpatient laparoscopic GB surgery Then had black stool EGD done 01/06/20 ulcer at ampulla where sphincterotomy was done This was injected She  continues to smoke   CXR showed bilateral effusion with subsegmental atelectasis BNP 893 She was d/c with lasix 20 mg daily  Previous echo 04/11/19 showed EF 60-65% with no significant valve disease    She has appointment with general surgery Friday. No cardiac symptoms since d/c No CHF symptoms taking Daily diuretic Discussed need for echo to f/u EF  The patient does not have symptoms concerning for COVID-19 infection (fever, chills, cough, or new shortness of breath).    Past Medical History:  Diagnosis Date  . Acute cystitis   . Acute respiratory failure ([place])   . Allergy    as a child grew out of them  . Anemia    pernicious anemia  . Anxiety   . Arthritis   . Back pain   . Blood transfusion   . CAP (community acquired pneumonia)   . Chronic pain syndrome   . Depression   . Diverticulosis of colon   . Dysthymia   . Esophagitis   . EtOH dependence ([place])   . Gastritis   . GERD (gastroesophageal reflux disease)   . H/O chest pain Dec. 2013   no work up done  . Hx of colonic polyps   . Hypertension   . Irritable bowel syndrome   . Osteopenia   . Osteoporosis   . Pneumonia 2019  . Polypharmacy   . Reflux esophagitis   . Right sided sciatica 12/22/2017  . Tobacco  use disorder   . Unspecified chronic bronchitis ([place])   . Vertigo   . Vitamin B12 deficiency   . Wears glasses    Past Surgical History:  Procedure Laterality Date  . APPENDECTOMY    . BACK SURGERY  10-09   Dr. Patrice Paradise  . CARPAL TUNNEL RELEASE Right 08/14/2014   Procedure: RIGHT CARPAL TUNNEL RELEASE AND INJECT LEFT THUMB;  Surgeon: Daryll Brod, MD;  Location: [place];  Service: Orthopedics;  Laterality: Right;  . COLONOSCOPY    . ENDOSCOPIC RETROGRADE CHOLANGIOPANCREATOGRAPHY (ERCP) WITH PROPOFOL N/A 12/25/2019   Procedure: ENDOSCOPIC RETROGRADE CHOLANGIOPANCREATOGRAPHY (ERCP) WITH PROPOFOL;  Surgeon: Gatha Mayer, MD;  Location: [place];  Service: Gastroenterology;  Laterality:  N/A;  . ESOPHAGOGASTRODUODENOSCOPY (EGD) WITH PROPOFOL N/A 01/06/2020   Procedure: ESOPHAGOGASTRODUODENOSCOPY (EGD) WITH PROPOFOL;  Surgeon: Irene Shipper, MD;  Location: [facility] ENDOSCOPY;  Service: Endoscopy;  Laterality: N/A;  . HOT HEMOSTASIS N/A 01/06/2020   Procedure: HOT HEMOSTASIS (ARGON PLASMA COAGULATION/BICAP);  Surgeon: Irene Shipper, MD;  Location: [facility] ENDOSCOPY;  Service: Endoscopy;  Laterality: N/A;  . INTRAMEDULLARY (IM) NAIL INTERTROCHANTERIC Right 10/01/2018   Procedure: INTRAMEDULLARY (IM) NAIL INTERTROCHANTRIC;  Surgeon: Thornton Park, MD;  Location: [facility] ORS;  Service: Orthopedics;  Laterality: Right;  . LAPAROSCOPY N/A 02/21/2013   Procedure: LAPAROSCOPY OPERATIVE;  Surgeon: Margarette Asal, MD;  Location: [place] [facility]S;  Service: Gynecology;  Laterality: N/A;  REQUESTING 5MM SCOPE WITH CAMERA  . LEFT HEART CATH AND CORONARY ANGIOGRAPHY N/A 05/11/2019   Procedure: LEFT HEART CATH AND CORONARY ANGIOGRAPHY;  Surgeon: Sherren Mocha, MD;  Location: [place] CV LAB;  Service: Cardiovascular;  Laterality: N/A;  . REMOVAL OF STONES  12/25/2019   Procedure: Karlyn Agee;  Surgeon: Gatha Mayer, MD;  Location: [facility] ENDOSCOPY;  Service: Gastroenterology;;  . Clide Deutscher  01/06/2020   Procedure: Clide Deutscher;  Surgeon: Irene Shipper, MD;  Location: [facility] ENDOSCOPY;  Service: Endoscopy;;  . Joan Mayans  12/25/2019   Procedure: SPHINCTEROTOMY;  Surgeon: Gatha Mayer, MD;  Location: [place];  Service: Gastroenterology;;  . TONSILLECTOMY    . TUBAL LIGATION    . UPPER GASTROINTESTINAL ENDOSCOPY       No outpatient medications have been marked as taking for the 01/22/20 encounter (Telemedicine) with Josue Hector, MD.     Allergies:   Atorvastatin, Levofloxacin, Omnicef [cefdinir], Trazodone and nefazodone, Sulfa antibiotics, and Sulfonamide derivatives   Social History   Tobacco Use  . Smoking status: Current Every Day Smoker    Packs/day: 1.00    Years: 30.00    Pack years:  30.00    Types: Cigarettes  . Smokeless tobacco: Never Used  Substance Use Topics  . Alcohol use: Yes    Alcohol/week: 3.0 standard drinks    Types: 3 Standard drinks or equivalent per week    Comment: occasional  . Drug use: No     Family Hx: The patient's family history includes Alcohol abuse in her daughter; Bipolar disorder in her daughter; Colon cancer in her brother and father; Drug abuse in her daughter; Heart disease in her mother; Prostate cancer in her father; Skin cancer in her sister. There is no history of Esophageal cancer, Rectal cancer, or Stomach cancer.  ROS:   Please see the history of present illness.    General:no colds or fevers, no weight changes Skin:no rashes or ulcers HEENT:no blurred vision, no congestion CV:see HPI PUL:see HPI GI:no diarrhea now but hx of [name].  no constipation or melena, no  indigestion GU:no hematuria, no dysuria MS:no joint pain, no claudication Neuro:no syncope, no lightheadedness Endo:no diabetes, no thyroid disease hemoc followed by Hematology visit in July.     All other systems reviewed and are negative.   Prior CV studies:   The following studies were reviewed today:  Limited Echo 04/11/19 IMPRESSIONS    1. The left ventricle has normal systolic function, with an ejection fraction of 60-65%. The cavity size was normal. Left ventricular diastolic Doppler parameters are consistent with impaired relaxation.  2. The mitral valve is grossly normal.  3. The tricuspid valve was grossly normal.  4. The aortic valve is tricuspid Aortic valve regurgitation was not assessed by color flow Doppler. No stenosis of the aortic valve.  5. Limited study for LV function; full doppler not performed; normal LV function; mild diastolic dysfunction.  .Cath 05/11/19  Conclusion    Mid RCA lesion is 50% stenosed.  Prox Cx to Mid Cx lesion is 30% stenosed.  Prox LAD to Mid LAD lesion is 50% stenosed.  LV end diastolic pressure is  low.   1.  Mild to moderate diffuse calcified stenosis of the proximal to mid LAD 2.  Widely patent left main and left circumflex with calcified nonobstructive disease 3.  Mild to moderate focal stenosis of the mid RCA, with lesion no greater than 50% does not appear flow-limiting 4.  Low LVEDP of 4 mmHg  Recommendations: Recommend medical therapy.  There is no target lesion for PCI.  The patient is a heavy smoker and tobacco cessation is recommended.        Labs/Other Tests and Data Reviewed:    EKG:   SR rate 75 normal 05/11/19   Recent Labs: 04/08/2019: TSH 0.499 12/28/2019: B Natriuretic Peptide 893.7 12/29/2019: ALT 7 12/30/2019: Magnesium 2.1 01/11/2020: BUN 8; Creatinine, Ser 0.75; Hemoglobin 13.1; Platelets 341.0; Potassium 3.7; Sodium 138   Recent Lipid Panel Lab Results  Component Value Date/Time   CHOL 126 12/04/2019 11:17 AM   TRIG 93 12/04/2019 11:17 AM   HDL 62 12/04/2019 11:17 AM   CHOLHDL 2.0 12/04/2019 11:17 AM   CHOLHDL 2.7 04/09/2019 06:15 AM   LDLCALC 47 12/04/2019 11:17 AM   LDLDIRECT 102.4 05/24/2008 10:37 AM    Wt Readings from Last 3 Encounters:  01/22/20 110 lb 12.8 oz (50.3 kg)  01/11/20 112 lb (50.8 kg)  01/08/20 118 lb 2.7 oz (53.6 kg)     Objective:    Vital Signs:  BP 104/68   Pulse 90   Ht 5\' 3"  (1.6 m)   Wt 110 lb 12.8 oz (50.3 kg)   SpO2 94%   BMI 19.63 kg/m    No distress Skin warm and dry No JVP elevation No edema Wearing glasses      ASSESSMENT & PLAN:    1. CAD:  Moderate RCA/LAD by cath 05/11/19 continue ASA beta blocker  2. Chol:  Given high calcium score and moderate CAD should be on statin start crestor 5 mg M/W/F labs in 3 months  3. GI:  Biliary colic needs f/u with general surgery for ? Laparoscopic surgery No further GI bleeding post injection of sphincterotomy site Clear to have laparoscopic GB surgery Seeing surgeon Friday  4. Smoking:  Counseled on cessation lung cancer screening CT per primary recent  hypercarbic respiratory failure post ERCP procedure   CXR did show pleural effusion and elevated bNP will update echo to make sure EF still normal   5. GERD per PCP on protonix  COVID-19 Education: The signs and symptoms of COVID-19 were discussed with the patient and how to seek care for testing (follow up with PCP or arrange E-visit).  The importance of social distancing was discussed today.  Time:   Today, I have spent  30 minutes with patient     Medication Adjustments/Labs and Tests Ordered: Current medicines are reviewed at length with the patient today.  Concerns regarding medicines are outlined above.   Tests Ordered:  Echo ? Diastolic CHF   Medication Changes: No orders of the defined types were placed in this encounter.   Disposition:  Follow up in 3 months   Signed, Jenkins Rouge, MD  01/22/2020 8:38 AM    [facility]

## 2020-01-22 ENCOUNTER — Inpatient Hospital Stay: Payer: Medicare HMO | Admitting: Internal Medicine

## 2020-01-22 ENCOUNTER — Other Ambulatory Visit: Payer: Self-pay

## 2020-01-22 ENCOUNTER — Inpatient Hospital Stay: Payer: Medicare HMO

## 2020-01-22 ENCOUNTER — Telehealth (INDEPENDENT_AMBULATORY_CARE_PROVIDER_SITE_OTHER): Payer: Medicare HMO | Admitting: Cardiovascular Disease

## 2020-01-22 ENCOUNTER — Encounter: Payer: Self-pay | Admitting: Cardiovascular Disease

## 2020-01-22 VITALS — BP 104/68 | HR 90 | Ht 63.0 in | Wt 110.8 lb

## 2020-01-22 DIAGNOSIS — I5033 Acute on chronic diastolic (congestive) heart failure: Secondary | ICD-10-CM | POA: Diagnosis not present

## 2020-01-22 DIAGNOSIS — Z01810 Encounter for preprocedural cardiovascular examination: Secondary | ICD-10-CM

## 2020-01-22 DIAGNOSIS — K91840 Postprocedural hemorrhage and hematoma of a digestive system organ or structure following a digestive system procedure: Secondary | ICD-10-CM | POA: Diagnosis not present

## 2020-01-22 DIAGNOSIS — D510 Vitamin B12 deficiency anemia due to intrinsic factor deficiency: Secondary | ICD-10-CM | POA: Diagnosis not present

## 2020-01-22 DIAGNOSIS — I5030 Unspecified diastolic (congestive) heart failure: Secondary | ICD-10-CM

## 2020-01-22 DIAGNOSIS — J439 Emphysema, unspecified: Secondary | ICD-10-CM | POA: Diagnosis not present

## 2020-01-22 DIAGNOSIS — J9602 Acute respiratory failure with hypercapnia: Secondary | ICD-10-CM | POA: Diagnosis not present

## 2020-01-22 DIAGNOSIS — K8062 Calculus of gallbladder and bile duct with acute cholecystitis without obstruction: Secondary | ICD-10-CM | POA: Diagnosis not present

## 2020-01-22 DIAGNOSIS — K9189 Other postprocedural complications and disorders of digestive system: Secondary | ICD-10-CM | POA: Diagnosis not present

## 2020-01-22 DIAGNOSIS — I110 Hypertensive heart disease with heart failure: Secondary | ICD-10-CM | POA: Diagnosis not present

## 2020-01-22 DIAGNOSIS — J9601 Acute respiratory failure with hypoxia: Secondary | ICD-10-CM | POA: Diagnosis not present

## 2020-01-22 DIAGNOSIS — I2510 Atherosclerotic heart disease of native coronary artery without angina pectoris: Secondary | ICD-10-CM | POA: Diagnosis not present

## 2020-01-22 NOTE — Addendum Note (Signed)
Addended by: Aris Georgia, Firmin Belisle L on: 01/22/2020 08:52 AM   Modules accepted: Orders

## 2020-01-22 NOTE — Patient Instructions (Addendum)
Medication Instructions:   *If you need a refill on your cardiac medications before your next appointment, please call your pharmacy*  Lab Work:  If you have labs (blood work) drawn today and your tests are completely normal, you will receive your results only by: Marland Kitchen MyChart Message (if you have MyChart) OR . A paper copy in the mail If you have any lab test that is abnormal or we need to change your treatment, we will call you to review the results.  Testing/Procedures: Your physician has requested that you have an echocardiogram. Echocardiography is a painless test that uses sound waves to create images of your heart. It provides your doctor with information about the size and shape of your heart and how well your heart's chambers and valves are working. This procedure takes approximately one hour. There are no restrictions for this procedure.  Follow-Up: At [facility], you and your health needs are our priority.  As part of our continuing mission to provide you with exceptional heart care, we have created designated Provider Care Teams.  These Care Teams include your primary Cardiologist (physician) and Advanced Practice Providers (APPs -  Physician Assistants and Nurse Practitioners) who all work together to provide you with the care you need, when you need it.  Your next appointment:   3 month(s)  The format for your next appointment:   In Person  Provider:   You may see Jenkins Rouge, MD or one of the following Advanced Practice Providers on your designated Care Team:    Truitt Merle, NP  Cecilie Kicks, NP  Kathyrn Drown, NP

## 2020-01-26 DIAGNOSIS — K91840 Postprocedural hemorrhage and hematoma of a digestive system organ or structure following a digestive system procedure: Secondary | ICD-10-CM | POA: Diagnosis not present

## 2020-01-26 DIAGNOSIS — I110 Hypertensive heart disease with heart failure: Secondary | ICD-10-CM | POA: Diagnosis not present

## 2020-01-26 DIAGNOSIS — J9601 Acute respiratory failure with hypoxia: Secondary | ICD-10-CM | POA: Diagnosis not present

## 2020-01-26 DIAGNOSIS — K580 Irritable bowel syndrome with diarrhea: Secondary | ICD-10-CM | POA: Diagnosis not present

## 2020-01-26 DIAGNOSIS — J439 Emphysema, unspecified: Secondary | ICD-10-CM | POA: Diagnosis not present

## 2020-01-26 DIAGNOSIS — J9602 Acute respiratory failure with hypercapnia: Secondary | ICD-10-CM | POA: Diagnosis not present

## 2020-01-26 DIAGNOSIS — K8062 Calculus of gallbladder and bile duct with acute cholecystitis without obstruction: Secondary | ICD-10-CM | POA: Diagnosis not present

## 2020-01-26 DIAGNOSIS — D510 Vitamin B12 deficiency anemia due to intrinsic factor deficiency: Secondary | ICD-10-CM | POA: Diagnosis not present

## 2020-01-26 DIAGNOSIS — K9189 Other postprocedural complications and disorders of digestive system: Secondary | ICD-10-CM | POA: Diagnosis not present

## 2020-01-26 DIAGNOSIS — K264 Chronic or unspecified duodenal ulcer with hemorrhage: Secondary | ICD-10-CM | POA: Diagnosis not present

## 2020-01-26 DIAGNOSIS — K8050 Calculus of bile duct without cholangitis or cholecystitis without obstruction: Secondary | ICD-10-CM | POA: Diagnosis not present

## 2020-01-26 DIAGNOSIS — I5033 Acute on chronic diastolic (congestive) heart failure: Secondary | ICD-10-CM | POA: Diagnosis not present

## 2020-01-26 DIAGNOSIS — I2510 Atherosclerotic heart disease of native coronary artery without angina pectoris: Secondary | ICD-10-CM | POA: Diagnosis not present
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
## 2020-01-30 ENCOUNTER — Other Ambulatory Visit: Payer: Self-pay

## 2020-01-30 ENCOUNTER — Encounter: Payer: Self-pay | Admitting: Internal Medicine

## 2020-01-30 ENCOUNTER — Ambulatory Visit: Payer: Medicare HMO | Admitting: Internal Medicine

## 2020-01-30 ENCOUNTER — Ambulatory Visit: Payer: Self-pay | Admitting: General Surgery

## 2020-01-30 ENCOUNTER — Other Ambulatory Visit: Payer: Self-pay | Admitting: Internal Medicine

## 2020-01-30 VITALS — BP 106/60 | HR 84 | Temp 98.4°F | Ht 61.0 in | Wt 114.0 lb

## 2020-01-30 DIAGNOSIS — J9601 Acute respiratory failure with hypoxia: Secondary | ICD-10-CM | POA: Diagnosis not present

## 2020-01-30 DIAGNOSIS — K529 Noninfective gastroenteritis and colitis, unspecified: Secondary | ICD-10-CM

## 2020-01-30 DIAGNOSIS — K8062 Calculus of gallbladder and bile duct with acute cholecystitis without obstruction: Secondary | ICD-10-CM | POA: Diagnosis not present

## 2020-01-30 DIAGNOSIS — K580 Irritable bowel syndrome with diarrhea: Secondary | ICD-10-CM | POA: Diagnosis not present

## 2020-01-30 DIAGNOSIS — K91840 Postprocedural hemorrhage and hematoma of a digestive system organ or structure following a digestive system procedure: Secondary | ICD-10-CM | POA: Diagnosis not present

## 2020-01-30 DIAGNOSIS — I110 Hypertensive heart disease with heart failure: Secondary | ICD-10-CM | POA: Diagnosis not present

## 2020-01-30 DIAGNOSIS — J9602 Acute respiratory failure with hypercapnia: Secondary | ICD-10-CM | POA: Diagnosis not present

## 2020-01-30 DIAGNOSIS — J439 Emphysema, unspecified: Secondary | ICD-10-CM | POA: Diagnosis not present

## 2020-01-30 DIAGNOSIS — K9189 Other postprocedural complications and disorders of digestive system: Secondary | ICD-10-CM | POA: Diagnosis not present

## 2020-01-30 DIAGNOSIS — I5033 Acute on chronic diastolic (congestive) heart failure: Secondary | ICD-10-CM | POA: Diagnosis not present

## 2020-01-30 DIAGNOSIS — D510 Vitamin B12 deficiency anemia due to intrinsic factor deficiency: Secondary | ICD-10-CM | POA: Diagnosis not present

## 2020-01-30 DIAGNOSIS — I2510 Atherosclerotic heart disease of native coronary artery without angina pectoris: Secondary | ICD-10-CM | POA: Diagnosis not present

## 2020-01-30 MED ORDER — CLENPIQ 10-3.5-12 MG-GM -GM/160ML PO SOLN: 1.0000 | Freq: Once | ORAL | 0 refills | Status: AC

## 2020-01-30 NOTE — Progress Notes (Signed)
Denise King 69 y.o. 1951-12-06 [id number]  Assessment & Plan:   Encounter Diagnoses  Name Primary?  . Chronic diarrhea Yes  . Irritable bowel syndrome with diarrhea     Given the persistence of problems there is a small chance that something was missed, so I am going to repeat a colonoscopy and random biopsies at least.  This has to be done at the hospital because she is using oxygen at this time.  February 17 is her date.  She asked for a prep that was easier to take so will use Clen Piq  Consider retrying alosetron.  She has polypharmacy issues.  However she reports this diarrhea is severe.  It is puzzling given the amount of narcotics she takes.  Typically they are constipating.  Note I see in her problem list alcohol dependence and alcohol issues though the quantification of alcohol use we have does not fit with that.  I did not talk to her about that today but do need to discuss to clarify this issue.  Certainly excessive alcohol consumption could cause diarrhea.  Review of the flow sheets when she was hospitalized suggest that diarrhea abated in the hospitalization.  She is quite complicated in terms of all of her medical problems, I wonder if she would be a candidate for pelvic floor physical therapy to see if that would help.  I did not ask about urinary issues today we will do that and try to perform a functional rectal exam prior to sedation at the hospital for her colonoscopy.   CC: Biagio Borg, MD Dr. Gurney Maxin  Subjective:   Chief Complaint: Diarrhea  HPI Denise King is here for follow-up, she had a hospitalization related to diarrhea and dehydration and was discovered to have cholelithiasis and cholecystitis.  She is improved from that but still having diarrhea.  I had performed an ERCP as I was on hospital coverage which was negative for choledocholithiasis but she was quite lethargic after that and having trouble breathing and ended up going to the ICU  related to respiratory depression issues which might of been from pain medication.  Subsequently she had melena and had a post sphincterotomy ulcer found and treated by Dr. Henrene Pastor in early January.  Over the years she has had diarrhea that I thought was [name].  Random colon biopsies in 2017 were negative and she has not responded to antidiarrheals, colestipol.  At one point she seemed to respond to alosetron but got headaches so stopped it.  She does not remember that.  She is on chronic narcotics in significant doses and still has diarrhea.  She does also have a family history of colon cancer in 2 relatives and a history of colon polyps.  Routine colonoscopy is not due until 2022.  She has an elective cholecystectomy scheduled for late February.  She reports from 2-3 up to 8 or 10 stools a day generally softer loose.  Watery at times.  She is currently using oxygen as needed because she had desaturations with walking.  Prior to discharge.     Wt Readings from Last 3 Encounters:  01/30/20 114 lb (51.7 kg)  01/22/20 110 lb 12.8 oz (50.3 kg)  01/11/20 112 lb (50.8 kg)     Allergies  Allergen Reactions  . Atorvastatin Nausea And Vomiting  . Levofloxacin Other (See Comments)    Reaction: achilles tendon pain  . Omnicef [Cefdinir] Diarrhea    Uncontrollable diarrhea  . Trazodone And Nefazodone Other (See Comments)    "  Felt like I was going to faint"  . Sulfa Antibiotics Rash  . Sulfonamide Derivatives Rash   Current Meds  Medication Sig  . ALPRAZolam (XANAX) 0.25 MG tablet Take 1 tablet (0.25 mg total) by mouth 3 (three) times daily as needed for anxiety.  . ARIPiprazole (ABILIFY) 2 MG tablet Take 1 tablet (2 mg total) by mouth daily. (Patient taking differently: Take 2 mg by mouth at bedtime. )  . busPIRone (BUSPAR) 10 MG tablet Take 1 tablet (10 mg total) by mouth 3 (three) times daily.  . Cholecalciferol (VITAMIN D) 50 MCG (2000 UT) tablet Take 2,000 Units by mouth daily after lunch.  .  cholestyramine (QUESTRAN) 4 GM/DOSE powder Take 1 packet (4 g total) by mouth 2 (two) times daily with a meal. (Patient taking differently: Take 4 g by mouth 2 (two) times daily as needed ([name] (take with meals)). )  . denosumab (PROLIA) 60 MG/ML SOSY injection Inject 60 mg into the skin every 6 (six) months.  . folic acid (FOLVITE) 1 MG tablet Take 1 tablet (1 mg total) by mouth daily. (Patient taking differently: Take 1 mg by mouth daily after lunch. )  . furosemide (LASIX) 20 MG tablet Take 1 tablet (20 mg total) by mouth daily.  Marland Kitchen gabapentin (NEURONTIN) 300 MG capsule Take 300 mg by mouth 3 (three) times daily.  . hyoscyamine (ANASPAZ) 0.125 MG TBDP disintergrating tablet Place 0.125 mg under the tongue every 6 (six) hours as needed ([name] cramps).   . morphine (MS CONTIN) 60 MG 12 hr tablet Take 60 mg by mouth every 12 (twelve) hours.  . Multiple Vitamin (MULTIVITAMIN WITH MINERALS) TABS tablet Take 1 tablet by mouth daily.  . nicotine (NICODERM CQ - DOSED IN MG/24 HOURS) 21 mg/24hr patch Place 1 patch (21 mg total) onto the skin daily.  . Oxycodone HCl 10 MG TABS Take 10 mg by mouth 4 (four) times daily.   . pantoprazole (PROTONIX) 40 MG tablet TAKE 1 TABLET ONCE DAILY.  Marland Kitchen potassium chloride (KLOR-CON) 10 MEQ tablet TAKE 1 TABLET ONCE DAILY.  Marland Kitchen sertraline (ZOLOFT) 100 MG tablet Take 2 tablets (200 mg total) by mouth at bedtime.  . valACYclovir (VALTREX) 500 MG tablet Take 500 mg by mouth 2 (two) times daily as needed (flare ups).  . vitamin B-12 (CYANOCOBALAMIN) 100 MCG tablet Take 1 tablet (100 mcg total) by mouth daily. (Patient taking differently: Take 100 mcg by mouth daily after lunch. )   Past Medical History:  Diagnosis Date  . Acute cystitis   . Acute respiratory failure ([place])   . Allergy    as a child grew out of them  . Anemia    pernicious anemia  . Anxiety   . Arthritis   . Back pain   . Blood transfusion   . CAP (community acquired pneumonia)   . Chronic pain syndrome     . Common bile duct stone   . Depression   . Diverticulosis of colon   . Dysthymia   . Esophagitis   . EtOH dependence ([place])   . Gastritis   . GERD (gastroesophageal reflux disease)   . H/O chest pain Dec. 2013   no work up done  . Hx of colonic polyps   . Hypertension   . Irritable bowel syndrome   . Osteopenia   . Osteoporosis   . Pneumonia 2019  . Polypharmacy   . Reflux esophagitis   . Right sided sciatica 12/22/2017  . Tobacco use disorder   .  Unspecified chronic bronchitis ([place])   . Vertigo   . Vitamin B12 deficiency   . Wears glasses    Past Surgical History:  Procedure Laterality Date  . APPENDECTOMY    . BACK SURGERY  10-09   Dr. Patrice Paradise  . CARPAL TUNNEL RELEASE Right 08/14/2014   Procedure: RIGHT CARPAL TUNNEL RELEASE AND INJECT LEFT THUMB;  Surgeon: Daryll Brod, MD;  Location: [place];  Service: Orthopedics;  Laterality: Right;  . COLONOSCOPY    . ENDOSCOPIC RETROGRADE CHOLANGIOPANCREATOGRAPHY (ERCP) WITH PROPOFOL N/A 12/25/2019   Procedure: ENDOSCOPIC RETROGRADE CHOLANGIOPANCREATOGRAPHY (ERCP) WITH PROPOFOL;  Surgeon: Gatha Mayer, MD;  Location: [place];  Service: Gastroenterology;  Laterality: N/A;  . ESOPHAGOGASTRODUODENOSCOPY (EGD) WITH PROPOFOL N/A 01/06/2020   Procedure: ESOPHAGOGASTRODUODENOSCOPY (EGD) WITH PROPOFOL;  Surgeon: Irene Shipper, MD;  Location: [facility] ENDOSCOPY;  Service: Endoscopy;  Laterality: N/A;  . HOT HEMOSTASIS N/A 01/06/2020   Procedure: HOT HEMOSTASIS (ARGON PLASMA COAGULATION/BICAP);  Surgeon: Irene Shipper, MD;  Location: [facility] ENDOSCOPY;  Service: Endoscopy;  Laterality: N/A;  . INTRAMEDULLARY (IM) NAIL INTERTROCHANTERIC Right 10/01/2018   Procedure: INTRAMEDULLARY (IM) NAIL INTERTROCHANTRIC;  Surgeon: Thornton Park, MD;  Location: [facility] ORS;  Service: Orthopedics;  Laterality: Right;  . LAPAROSCOPY N/A 02/21/2013   Procedure: LAPAROSCOPY OPERATIVE;  Surgeon: Margarette Asal, MD;  Location: [place] [facility];  Service:  Gynecology;  Laterality: N/A;  REQUESTING 5MM SCOPE WITH CAMERA  . LEFT HEART CATH AND CORONARY ANGIOGRAPHY N/A 05/11/2019   Procedure: LEFT HEART CATH AND CORONARY ANGIOGRAPHY;  Surgeon: Sherren Mocha, MD;  Location: [place] CV LAB;  Service: Cardiovascular;  Laterality: N/A;  . REMOVAL OF STONES  12/25/2019   Procedure: Karlyn Agee;  Surgeon: Gatha Mayer, MD;  Location[facility]COPY;  Service: Gastroenterology;;  . Clide Deutscher  01/06/2020   Procedure: Clide Deutscher;  Surgeon: Irene Shipper, MD;  Location: [facility] ENDOSCOPY;  Service: Endoscopy;;  . Joan Mayans  12/25/2019   Procedure: SPHINCTEROTOMY;  Surgeon: Gatha Mayer, MD;  Location: [place];  Service: Gastroenterology;;  . TONSILLECTOMY    . TUBAL LIGATION    . UPPER GASTROINTESTINAL ENDOSCOPY     Social History   Social History Narrative   Married x 38 yrs. Has BS degree in Horticulture from [employer]. Currently resides in a house with her husband. 3 cats.  Fun: used to like to do a lot of things.    Denies religious beliefs that would effect health care.    lives with husband [name]; quit smoking- October 6th, 2019. ocasional alcohol. [employer] [2009] on disability sec to back issues.    family history includes Alcohol abuse in her daughter; Bipolar disorder in her daughter; Colon cancer in her brother and father; Drug abuse in her daughter; Heart disease in her mother; Prostate cancer in her father; Skin cancer in her sister.   Review of Systems See HPI Objective:   Physical Exam BP 106/60 (BP Location: Left Arm, Patient Position: Sitting, Cuff Size: Normal)   Pulse 84   Temp 98.4 F (36.9 C)   Ht 5\' 1"  (1.549 m) Comment: height measured without shoes  Wt 114 lb (51.7 kg)   BMI 21.54 kg/m  No acute distress

## 2020-01-30 NOTE — H&P (View-Only) (Signed)
CAROLYN SCHLEPP 69 y.o. [birth year] [id number]  Assessment & Plan:   Encounter Diagnoses  Name Primary?   Chronic diarrhea Yes   Irritable bowel syndrome with diarrhea     Given the persistence of problems there is a small chance that something was missed, so I am going to repeat a colonoscopy and random biopsies at least.  This has to be done at the hospital because she is using oxygen at this time.  February 17 is her date.  She asked for a prep that was easier to take so will use Clen Piq  Consider retrying alosetron.  She has polypharmacy issues.  However she reports this diarrhea is severe.  It is puzzling given the amount of narcotics she takes.  Typically they are constipating.  Note I see in her problem list alcohol dependence and alcohol issues though the quantification of alcohol use we have does not fit with that.  I did not talk to her about that today but do need to discuss to clarify this issue.  Certainly excessive alcohol consumption could cause diarrhea.  Review of the flow sheets when she was hospitalized suggest that diarrhea abated in the hospitalization.  She is quite complicated in terms of all of her medical problems, I wonder if she would be a candidate for pelvic floor physical therapy to see if that would help.  I did not ask about urinary issues today we will do that and try to perform a functional rectal exam prior to sedation at the hospital for her colonoscopy.   CC: Biagio Borg, MD Dr. Gurney Maxin  Subjective:   Chief Complaint: Diarrhea  HPI [name] is here for follow-up, she had a hospitalization related to diarrhea and dehydration and was discovered to have cholelithiasis and cholecystitis.  She is improved from that but still having diarrhea.  I had performed an ERCP as I was on hospital coverage which was negative for choledocholithiasis but she was quite lethargic after that and having trouble breathing and ended up going to the ICU  related to respiratory depression issues which might of been from pain medication.  Subsequently she had melena and had a post sphincterotomy ulcer found and treated by Dr. Henrene Pastor in early January.  Over the years she has had diarrhea that I thought was [name].  Random colon biopsies in 2017 were negative and she has not responded to antidiarrheals, colestipol.  At one point she seemed to respond to alosetron but got headaches so stopped it.  She does not remember that.  She is on chronic narcotics in significant doses and still has diarrhea.  She does also have a family history of colon cancer in 2 relatives and a history of colon polyps.  Routine colonoscopy is not due until 2022.  She has an elective cholecystectomy scheduled for late February.  She reports from 2-3 up to 8 or 10 stools a day generally softer loose.  Watery at times.  She is currently using oxygen as needed because she had desaturations with walking.  Prior to discharge.     Wt Readings from Last 3 Encounters:  01/30/20 114 lb (51.7 kg)  01/22/20 110 lb 12.8 oz (50.3 kg)  01/11/20 112 lb (50.8 kg)     Allergies  Allergen Reactions   Atorvastatin Nausea And Vomiting   Levofloxacin Other (See Comments)    Reaction: achilles tendon pain   Omnicef [Cefdinir] Diarrhea    Uncontrollable diarrhea   Trazodone And Nefazodone Other (See Comments)    "  Felt like I was going to faint"   Sulfa Antibiotics Rash   Sulfonamide Derivatives Rash   Current Meds  Medication Sig   ALPRAZolam (XANAX) 0.25 MG tablet Take 1 tablet (0.25 mg total) by mouth 3 (three) times daily as needed for anxiety.   ARIPiprazole (ABILIFY) 2 MG tablet Take 1 tablet (2 mg total) by mouth daily. (Patient taking differently: Take 2 mg by mouth at bedtime. )   busPIRone (BUSPAR) 10 MG tablet Take 1 tablet (10 mg total) by mouth 3 (three) times daily.   Cholecalciferol (VITAMIN D) 50 MCG (2000 UT) tablet Take 2,000 Units by mouth daily after lunch.    cholestyramine (QUESTRAN) 4 GM/DOSE powder Take 1 packet (4 g total) by mouth 2 (two) times daily with a meal. (Patient taking differently: Take 4 g by mouth 2 (two) times daily as needed ([name] (take with meals)). )   denosumab (PROLIA) 60 MG/ML SOSY injection Inject 60 mg into the skin every 6 (six) months.   folic acid (FOLVITE) 1 MG tablet Take 1 tablet (1 mg total) by mouth daily. (Patient taking differently: Take 1 mg by mouth daily after lunch. )   furosemide (LASIX) 20 MG tablet Take 1 tablet (20 mg total) by mouth daily.   gabapentin (NEURONTIN) 300 MG capsule Take 300 mg by mouth 3 (three) times daily.   hyoscyamine (ANASPAZ) 0.125 MG TBDP disintergrating tablet Place 0.125 mg under the tongue every 6 (six) hours as needed ([name] cramps).    morphine (MS CONTIN) 60 MG 12 hr tablet Take 60 mg by mouth every 12 (twelve) hours.   Multiple Vitamin (MULTIVITAMIN WITH MINERALS) TABS tablet Take 1 tablet by mouth daily.   nicotine (NICODERM CQ - DOSED IN MG/24 HOURS) 21 mg/24hr patch Place 1 patch (21 mg total) onto the skin daily.   Oxycodone HCl 10 MG TABS Take 10 mg by mouth 4 (four) times daily.    pantoprazole (PROTONIX) 40 MG tablet TAKE 1 TABLET ONCE DAILY.   potassium chloride (KLOR-CON) 10 MEQ tablet TAKE 1 TABLET ONCE DAILY.   sertraline (ZOLOFT) 100 MG tablet Take 2 tablets (200 mg total) by mouth at bedtime.   valACYclovir (VALTREX) 500 MG tablet Take 500 mg by mouth 2 (two) times daily as needed (flare ups).   vitamin B-12 (CYANOCOBALAMIN) 100 MCG tablet Take 1 tablet (100 mcg total) by mouth daily. (Patient taking differently: Take 100 mcg by mouth daily after lunch. )   Past Medical History:  Diagnosis Date   Acute cystitis    Acute respiratory failure ([place])    Allergy    as a child grew out of them   Anemia    pernicious anemia   Anxiety    Arthritis    Back pain    Blood transfusion    CAP (community acquired pneumonia)    Chronic pain syndrome      Common bile duct stone    Depression    Diverticulosis of colon    Dysthymia    Esophagitis    EtOH dependence ([place])    Gastritis    GERD (gastroesophageal reflux disease)    H/O chest pain Dec. 2013   no work up done   Hx of colonic polyps    Hypertension    Irritable bowel syndrome    Osteopenia    Osteoporosis    Pneumonia 2019   Polypharmacy    Reflux esophagitis    Right sided sciatica 12/22/2017   Tobacco use disorder  Unspecified chronic bronchitis ([facility])    Vertigo    Vitamin B12 deficiency    Wears glasses    Past Surgical History:  Procedure Laterality Date   APPENDECTOMY     BACK SURGERY  10-09   Dr. Patrice Paradise   CARPAL TUNNEL RELEASE Right 08/14/2014   Procedure: RIGHT CARPAL TUNNEL RELEASE AND INJECT LEFT THUMB;  Surgeon: Daryll Brod, MD;  Location: [place];  Service: Orthopedics;  Laterality: Right;   COLONOSCOPY     ENDOSCOPIC RETROGRADE CHOLANGIOPANCREATOGRAPHY (ERCP) WITH PROPOFOL N/A 12/25/2019   Procedure: ENDOSCOPIC RETROGRADE CHOLANGIOPANCREATOGRAPHY (ERCP) WITH PROPOFOL;  Surgeon: Gatha Mayer, MD;  Location: [place];  Service: Gastroenterology;  Laterality: N/A;   ESOPHAGOGASTRODUODENOSCOPY (EGD) WITH PROPOFOL N/A 01/06/2020   Procedure: ESOPHAGOGASTRODUODENOSCOPY (EGD) WITH PROPOFOL;  Surgeon: Irene Shipper, MD;  Location[facility]COPY;  Service: Endoscopy;  Laterality: N/A;   HOT HEMOSTASIS N/A 01/06/2020   Procedure: HOT HEMOSTASIS (ARGON PLASMA COAGULATION/BICAP);  Surgeon: Irene Shipper, MD;  Location[facility] ENDOSCOPY;  Service: Endoscopy;  Laterality: N/A;   INTRAMEDULLARY (IM) NAIL INTERTROCHANTERIC Right 10/01/2018   Procedure: INTRAMEDULLARY (IM) NAIL INTERTROCHANTRIC;  Surgeon: Thornton Park, MD;  Location: [facility] ORS;  Service: Orthopedics;  Laterality: Right;   LAPAROSCOPY N/A 02/21/2013   Procedure: LAPAROSCOPY OPERATIVE;  Surgeon: Margarette Asal, MD;  Location: [place] ORS;  Service:  Gynecology;  Laterality: N/A;  REQUESTING 5MM SCOPE WITH CAMERA   LEFT HEART CATH AND CORONARY ANGIOGRAPHY N/A 05/11/2019   Procedure: LEFT HEART CATH AND CORONARY ANGIOGRAPHY;  Surgeon: Sherren Mocha, MD;  Location: [place] CV LAB;  Service: Cardiovascular;  Laterality: N/A;   REMOVAL OF STONES  12/25/2019   Procedure: Karlyn Agee;  Surgeon: Gatha Mayer, MD;  Location: [facility] ENDOSCOPY;  Service: Gastroenterology;;   Clide Deutscher  01/06/2020   Procedure: Clide Deutscher;  Surgeon: Irene Shipper, MD;  Location: [facility] ENDOSCOPY;  Service: Endoscopy;;   SPHINCTEROTOMY  12/25/2019   Procedure: Joan Mayans;  Surgeon: Gatha Mayer, MD;  Location: [facility] ENDOSCOPY;  Service: Gastroenterology;;   TONSILLECTOMY     TUBAL LIGATION     UPPER GASTROINTESTINAL ENDOSCOPY     Social History   Social History Narrative   Married x 38 yrs. Has BS degree in Horticulture from [place]. Currently resides in a house with her husband. 3 cats.  Fun: used to like to do a lot of things.    Denies religious beliefs that would effect health care.    lives with husband [name]; quit smoking- October 6th, 2019. ocasional alcohol. [employer] [2009] on disability sec to back issues.    family history includes Alcohol abuse in her daughter; Bipolar disorder in her daughter; Colon cancer in her brother and father; Drug abuse in her daughter; Heart disease in her mother; Prostate cancer in her father; Skin cancer in her sister.   Review of Systems See HPI Objective:   Physical Exam BP 106/60 (BP Location: Left Arm, Patient Position: Sitting, Cuff Size: Normal)    Pulse 84    Temp 98.4 F (36.9 C)    Ht 5\' 1"  (1.549 m) Comment: height measured without shoes   Wt 114 lb (51.7 kg)    BMI 21.54 kg/m  No acute distress

## 2020-01-30 NOTE — Patient Instructions (Signed)
You have been scheduled for a colonoscopy. Please follow written instructions given to you at your visit today.  Please pick up your prep supplies at the pharmacy within the next 1-3 days. If you use inhalers (even only as needed), please bring them with you on the day of your procedure.   Do not take your questran while your prepping per Dr Carlean Purl.   I appreciate the opportunity to care for you. Silvano Rusk, MD, [facility]

## 2020-01-31 ENCOUNTER — Telehealth: Payer: Self-pay | Admitting: Internal Medicine
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
## 2020-01-31 MED ORDER — PANTOPRAZOLE SODIUM 40 MG PO TBEC: 40.0000 mg | DELAYED_RELEASE_TABLET | Freq: Two times a day (BID) | ORAL | 1 refills | Status: DC

## 2020-01-31 NOTE — Telephone Encounter (Signed)
Have her observe and monitor stools for further epsiodes and let us know

## 2020-01-31 NOTE — Telephone Encounter (Signed)
Patient was told during previous telephone call to continue to monitor and keep the clinic updated. She verbalized understanding.

## 2020-01-31 NOTE — Telephone Encounter (Signed)
     Pt c/o medication issue:  1. Name of Medication: pantoprazole (PROTONIX) 40 MG tablet  2. How are you currently taking this medication (dosage and times per day)? Once daily  3. Are you having a reaction (difficulty breathing--STAT)? no  4. What is your medication issue? Patient states med frequency was changed to once a day while in the hospital. Patient would like to go back to taking twice daily. Please advise

## 2020-01-31 NOTE — Telephone Encounter (Signed)
Spoke to the patient who reported this morning around 9:30 am, she produced 1 episode of liquid/watery black stool. She states her previous bowel movement which was a few hours before was normal in color and a little more firm than pudding. She also reported pain/tenderness right behind her umbilicus that in mild and seemingly constant. Denies SOB, dizziness, h/a or weakness. She wanted to make Dr. Carlean Purl aware.

## 2020-01-31 NOTE — Telephone Encounter (Signed)
Pt states that she takes pantoprazole 2 pills a day. She is requesting new prescription to be sent to her pharmacy. They have old prescription for once a day.

## 2020-01-31 NOTE — Telephone Encounter (Signed)
Pt states that she is having pain this morning, she had a bm that was black in color. She would like a call back with some advise.

## 2020-01-31 NOTE — Telephone Encounter (Signed)
Okay for BID sig Sir? Thank you.

## 2020-01-31 NOTE — Telephone Encounter (Signed)
Patient informed. 

## 2020-01-31 NOTE — Telephone Encounter (Signed)
Bid  # 60 1 refill  Explain to her I anticipate moving back to qd at some point so no 90 day

## 2020-02-01 ENCOUNTER — Ambulatory Visit (HOSPITAL_COMMUNITY): Payer: Medicare HMO | Attending: Cardiovascular Disease

## 2020-02-01 ENCOUNTER — Other Ambulatory Visit: Payer: Self-pay

## 2020-02-01 DIAGNOSIS — Z1231 Encounter for screening mammogram for malignant neoplasm of breast: Secondary | ICD-10-CM | POA: Diagnosis not present

## 2020-02-01 DIAGNOSIS — Z6820 Body mass index (BMI) 20.0-20.9, adult: Secondary | ICD-10-CM | POA: Diagnosis not present

## 2020-02-01 DIAGNOSIS — D510 Vitamin B12 deficiency anemia due to intrinsic factor deficiency: Secondary | ICD-10-CM | POA: Diagnosis not present

## 2020-02-01 DIAGNOSIS — I110 Hypertensive heart disease with heart failure: Secondary | ICD-10-CM | POA: Diagnosis not present

## 2020-02-01 DIAGNOSIS — I5033 Acute on chronic diastolic (congestive) heart failure: Secondary | ICD-10-CM | POA: Diagnosis not present

## 2020-02-01 DIAGNOSIS — I5030 Unspecified diastolic (congestive) heart failure: Secondary | ICD-10-CM | POA: Diagnosis not present

## 2020-02-01 DIAGNOSIS — J9601 Acute respiratory failure with hypoxia: Secondary | ICD-10-CM | POA: Diagnosis not present

## 2020-02-01 DIAGNOSIS — J439 Emphysema, unspecified: Secondary | ICD-10-CM | POA: Diagnosis not present

## 2020-02-01 DIAGNOSIS — Z01810 Encounter for preprocedural cardiovascular examination: Secondary | ICD-10-CM | POA: Diagnosis not present

## 2020-02-01 DIAGNOSIS — I2510 Atherosclerotic heart disease of native coronary artery without angina pectoris: Secondary | ICD-10-CM | POA: Diagnosis not present

## 2020-02-01 DIAGNOSIS — K91840 Postprocedural hemorrhage and hematoma of a digestive system organ or structure following a digestive system procedure: Secondary | ICD-10-CM | POA: Diagnosis not present

## 2020-02-01 DIAGNOSIS — J9602 Acute respiratory failure with hypercapnia: Secondary | ICD-10-CM | POA: Diagnosis not present

## 2020-02-01 DIAGNOSIS — Z01419 Encounter for gynecological examination (general) (routine) without abnormal findings: Secondary | ICD-10-CM | POA: Diagnosis not present

## 2020-02-01 DIAGNOSIS — K9189 Other postprocedural complications and disorders of digestive system: Secondary | ICD-10-CM | POA: Diagnosis not present

## 2020-02-01 DIAGNOSIS — K8062 Calculus of gallbladder and bile duct with acute cholecystitis without obstruction: Secondary | ICD-10-CM | POA: Diagnosis not present

## 2020-02-01 DIAGNOSIS — M816 Localized osteoporosis [Lequesne]: Secondary | ICD-10-CM | POA: Diagnosis not present
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
## 2020-02-01 NOTE — Telephone Encounter (Signed)
Pls advise if ok to go back taking twice a day.Marland KitchenJohny King

## 2020-02-02 ENCOUNTER — Telehealth: Payer: Self-pay

## 2020-02-02 MED ORDER — FUROSEMIDE 20 MG PO TABS: ORAL_TABLET | ORAL | 11 refills | Status: DC

## 2020-02-02 NOTE — Telephone Encounter (Signed)
Will send in prescription so patient can take extra lasix as needed.

## 2020-02-02 NOTE — Telephone Encounter (Signed)
-----   Message from Josue Hector, MD sent at 02/02/2020  9:23 AM EST ----- Echo is good just some diastolic dysfunction and she is on diuretic for this can take extra lasix for edema or dyspnea

## 2020-02-03 IMAGING — DX DG CHEST 1V PORT
1 series · 1 of 1 positions shown · non-contrast
Comparison: 05/12/2018

CLINICAL DATA: Shortness of breath.

EXAM:
PORTABLE CHEST 1 VIEW

[chest ap]
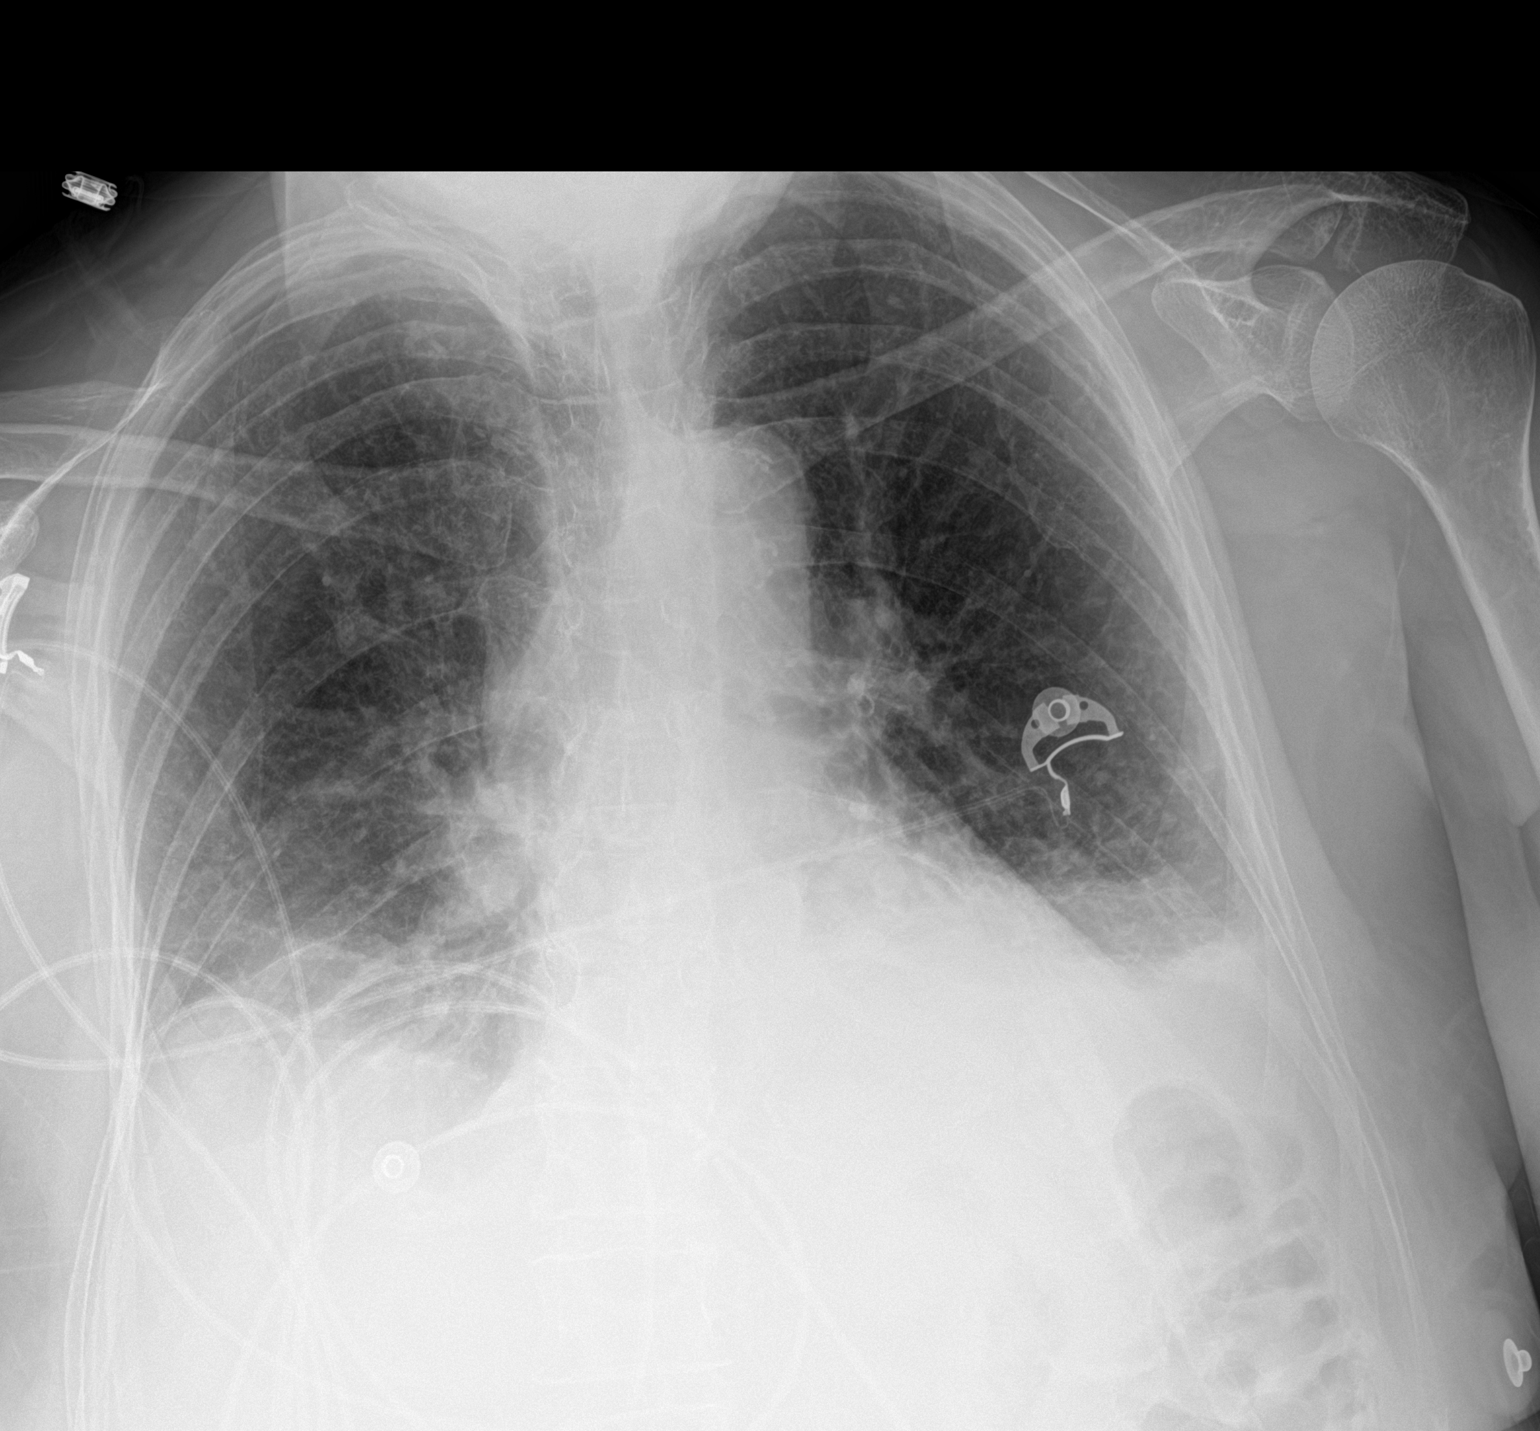

[1 of 1 positions shown; findings below may reference images not displayed]

FINDINGS: Lungs are hypoinflated with mild bibasilar opacification stable to
slightly worse which may be due to atelectasis or infection.
Cardiomediastinal silhouette and remainder of the exam is unchanged.
IMPRESSION: Stable to slightly worse mild bibasilar opacification which may be
due to atelectasis or infection.

## 2020-02-03 NOTE — Telephone Encounter (Signed)
I believe this was done intentionally per GI  Please ask pt to address this with GI if she has a concern about this

## 2020-02-04 DIAGNOSIS — I509 Heart failure, unspecified: Secondary | ICD-10-CM | POA: Diagnosis not present

## 2020-02-05 NOTE — Telephone Encounter (Signed)
Notified pt w/MD response. Per chart inform pt from her phone call w/GI Dr. Carlean Purl has sent in new rx for twice a day. For her to call [facility] and check status on rx.Denise KitchenJohny Chess

## 2020-02-09 DIAGNOSIS — K9189 Other postprocedural complications and disorders of digestive system: Secondary | ICD-10-CM | POA: Diagnosis not present

## 2020-02-09 DIAGNOSIS — K91840 Postprocedural hemorrhage and hematoma of a digestive system organ or structure following a digestive system procedure: Secondary | ICD-10-CM | POA: Diagnosis not present

## 2020-02-09 DIAGNOSIS — D510 Vitamin B12 deficiency anemia due to intrinsic factor deficiency: Secondary | ICD-10-CM | POA: Diagnosis not present

## 2020-02-09 DIAGNOSIS — I110 Hypertensive heart disease with heart failure: Secondary | ICD-10-CM | POA: Diagnosis not present

## 2020-02-09 DIAGNOSIS — K8062 Calculus of gallbladder and bile duct with acute cholecystitis without obstruction: Secondary | ICD-10-CM | POA: Diagnosis not present

## 2020-02-09 DIAGNOSIS — I2510 Atherosclerotic heart disease of native coronary artery without angina pectoris: Secondary | ICD-10-CM | POA: Diagnosis not present

## 2020-02-09 DIAGNOSIS — J9602 Acute respiratory failure with hypercapnia: Secondary | ICD-10-CM | POA: Diagnosis not present

## 2020-02-09 DIAGNOSIS — J439 Emphysema, unspecified: Secondary | ICD-10-CM | POA: Diagnosis not present

## 2020-02-09 DIAGNOSIS — I5033 Acute on chronic diastolic (congestive) heart failure: Secondary | ICD-10-CM | POA: Diagnosis not present

## 2020-02-09 DIAGNOSIS — J9601 Acute respiratory failure with hypoxia: Secondary | ICD-10-CM | POA: Diagnosis not present

## 2020-02-10 ENCOUNTER — Inpatient Hospital Stay (HOSPITAL_COMMUNITY): Admission: RE | Admit: 2020-02-10 | Payer: Medicare HMO | Source: Ambulatory Visit

## 2020-02-10 DIAGNOSIS — I509 Heart failure, unspecified: Secondary | ICD-10-CM | POA: Diagnosis not present

## 2020-02-12 ENCOUNTER — Other Ambulatory Visit (HOSPITAL_COMMUNITY)
Admission: RE | Admit: 2020-02-12 | Discharge: 2020-02-12 | Disposition: A | Discharge status: Home / Self Care | Payer: Medicare HMO | Source: Ambulatory Visit | Attending: Surgery | Admitting: Surgery

## 2020-02-12 ENCOUNTER — Telehealth: Payer: Self-pay | Admitting: Internal Medicine

## 2020-02-12 DIAGNOSIS — Z01812 Encounter for preprocedural laboratory examination: Secondary | ICD-10-CM | POA: Insufficient documentation

## 2020-02-12 DIAGNOSIS — Z20822 Contact with and (suspected) exposure to covid-19: Secondary | ICD-10-CM | POA: Insufficient documentation

## 2020-02-12 LAB — SARS CORONAVIRUS 2 (TAT 6-24 HRS): SARS Coronavirus 2: NEGATIVE

## 2020-02-12 NOTE — Telephone Encounter (Signed)
Patient was rescheduled for COVID testing today. Patient will go today, patient told her screen my not be back in time for her procedure scheduled on 2/17.

## 2020-02-12 NOTE — Telephone Encounter (Signed)
Pt is scheduled for a hospital colon 02/14/20 and stated that she was unable to get Covid test on Saturday due to weather conditions.  She would like to see if she can have it done today.

## 2020-02-13 DIAGNOSIS — M25551 Pain in right hip: Secondary | ICD-10-CM | POA: Diagnosis not present

## 2020-02-13 DIAGNOSIS — G894 Chronic pain syndrome: Secondary | ICD-10-CM | POA: Diagnosis not present

## 2020-02-13 DIAGNOSIS — M961 Postlaminectomy syndrome, not elsewhere classified: Secondary | ICD-10-CM | POA: Diagnosis not present

## 2020-02-13 DIAGNOSIS — Z79891 Long term (current) use of opiate analgesic: Secondary | ICD-10-CM | POA: Diagnosis not present

## 2020-02-14 ENCOUNTER — Other Ambulatory Visit: Payer: Self-pay

## 2020-02-14 ENCOUNTER — Encounter (HOSPITAL_COMMUNITY): Payer: Self-pay | Admitting: Internal Medicine

## 2020-02-14 ENCOUNTER — Ambulatory Visit (HOSPITAL_COMMUNITY)
Admission: RE | Admit: 2020-02-14 | Discharge: 2020-02-14 | Disposition: A | Discharge status: Home / Self Care | Payer: Medicare HMO | Attending: Internal Medicine | Admitting: Internal Medicine

## 2020-02-14 ENCOUNTER — Encounter (HOSPITAL_COMMUNITY)
Admission: RE | Disposition: A | Discharge status: Home / Self Care | Payer: Self-pay | Source: Home / Self Care | Attending: Internal Medicine

## 2020-02-14 ENCOUNTER — Ambulatory Visit (HOSPITAL_COMMUNITY): Payer: Medicare HMO | Admitting: Certified Registered Nurse Anesthetist

## 2020-02-14 DIAGNOSIS — K580 Irritable bowel syndrome with diarrhea: Secondary | ICD-10-CM | POA: Diagnosis not present

## 2020-02-14 DIAGNOSIS — Z79899 Other long term (current) drug therapy: Secondary | ICD-10-CM | POA: Insufficient documentation

## 2020-02-14 DIAGNOSIS — F1020 Alcohol dependence, uncomplicated: Secondary | ICD-10-CM | POA: Insufficient documentation

## 2020-02-14 DIAGNOSIS — F329 Major depressive disorder, single episode, unspecified: Secondary | ICD-10-CM | POA: Insufficient documentation

## 2020-02-14 DIAGNOSIS — Z800 Family history of malignant neoplasm of digestive organs: Secondary | ICD-10-CM | POA: Diagnosis not present

## 2020-02-14 DIAGNOSIS — K529 Noninfective gastroenteritis and colitis, unspecified: Secondary | ICD-10-CM | POA: Insufficient documentation

## 2020-02-14 DIAGNOSIS — K635 Polyp of colon: Secondary | ICD-10-CM

## 2020-02-14 DIAGNOSIS — K6289 Other specified diseases of anus and rectum: Secondary | ICD-10-CM | POA: Diagnosis not present

## 2020-02-14 DIAGNOSIS — M8580 Other specified disorders of bone density and structure, unspecified site: Secondary | ICD-10-CM | POA: Insufficient documentation

## 2020-02-14 DIAGNOSIS — M810 Age-related osteoporosis without current pathological fracture: Secondary | ICD-10-CM | POA: Insufficient documentation

## 2020-02-14 DIAGNOSIS — D120 Benign neoplasm of cecum: Secondary | ICD-10-CM | POA: Insufficient documentation

## 2020-02-14 DIAGNOSIS — F419 Anxiety disorder, unspecified: Secondary | ICD-10-CM | POA: Insufficient documentation

## 2020-02-14 DIAGNOSIS — Z87891 Personal history of nicotine dependence: Secondary | ICD-10-CM | POA: Diagnosis not present

## 2020-02-14 DIAGNOSIS — K219 Gastro-esophageal reflux disease without esophagitis: Secondary | ICD-10-CM | POA: Insufficient documentation

## 2020-02-14 DIAGNOSIS — R69 Illness, unspecified: Secondary | ICD-10-CM | POA: Diagnosis not present

## 2020-02-14 HISTORY — PX: COLONOSCOPY WITH PROPOFOL: SHX5780

## 2020-02-14 HISTORY — PX: BIOPSY: SHX5522

## 2020-02-14 SURGERY — COLONOSCOPY WITH PROPOFOL
Anesthesia: Monitor Anesthesia Care

## 2020-02-14 MED ORDER — PROPOFOL 10 MG/ML IV BOLUS
INTRAVENOUS | Status: AC
  Filled 2020-02-14: qty 20

## 2020-02-14 MED ORDER — PROPOFOL 10 MG/ML IV BOLUS
INTRAVENOUS | Status: DC | PRN
  Administered 2020-02-14: 125 ug/kg/min via INTRAVENOUS

## 2020-02-14 MED ORDER — PROPOFOL 10 MG/ML IV BOLUS
INTRAVENOUS | Status: DC | PRN
  Administered 2020-02-14: 20 mg via INTRAVENOUS
  Administered 2020-02-14: 30 mg via INTRAVENOUS

## 2020-02-14 MED ORDER — LACTATED RINGERS IV SOLN: INTRAVENOUS | Status: DC

## 2020-02-14 MED ORDER — SODIUM CHLORIDE 0.9 % IV SOLN: INTRAVENOUS | Status: DC

## 2020-02-14 MED ORDER — PHENYLEPHRINE 40 MCG/ML (10ML) SYRINGE FOR IV PUSH (FOR BLOOD PRESSURE SUPPORT)
PREFILLED_SYRINGE | INTRAVENOUS | Status: DC | PRN
  Administered 2020-02-14: 80 ug via INTRAVENOUS

## 2020-02-14 SURGICAL SUPPLY — 22 items

## 2020-02-14 NOTE — Anesthesia Postprocedure Evaluation (Signed)
Anesthesia Post Note  Patient: Denise King  Procedure(s) Performed: COLONOSCOPY WITH PROPOFOL (N/A ) BIOPSY     Patient location during evaluation: Endoscopy Anesthesia Type: MAC Level of consciousness: awake and alert Pain management: pain level controlled Vital Signs Assessment: post-procedure vital signs reviewed and stable Respiratory status: spontaneous breathing, nonlabored ventilation, respiratory function stable and patient connected to nasal cannula oxygen Cardiovascular status: stable and blood pressure returned to baseline Postop Assessment: no apparent nausea or vomiting Anesthetic complications: no    Last Vitals:  Vitals:   02/14/20 1500 02/14/20 1510  BP: 111/71   Pulse: 80 77  Resp: 16 13  Temp:    SpO2: 94% 93%    Last Pain:  Vitals:   02/14/20 1510  TempSrc:   PainSc: 0-No pain                 Catalina Gravel

## 2020-02-14 NOTE — Transfer of Care (Signed)
Immediate Anesthesia Transfer of Care Note  Patient: Denise King  Procedure(s) Performed: COLONOSCOPY WITH PROPOFOL (N/A ) BIOPSY  Patient Location: PACU  Anesthesia Type:MAC  Level of Consciousness: awake, alert , oriented and patient cooperative  Airway & Oxygen Therapy: Patient Spontanous Breathing and Patient connected to face mask  Post-op Assessment: Report given to RN and Post -op Vital signs reviewed and stable  Post vital signs: Reviewed and stable  Last Vitals:  Vitals Value Taken Time  BP    Temp    Pulse    Resp 19 02/14/20 1450  SpO2    Vitals shown include unvalidated device data.  Last Pain:  Vitals:   02/14/20 1326  TempSrc: Oral  PainSc: 7          Complications: No apparent anesthesia complications

## 2020-02-14 NOTE — Patient Instructions (Addendum)
DUE TO COVID-19 ONLY ONE VISITOR IS ALLOWED TO COME WITH YOU AND STAY IN THE WAITING ROOM ONLY DURING PRE OP AND PROCEDURE DAY OF SURGERY. THE 1 VISITOR MAY VISIT WITH YOU AFTER SURGERY IN YOUR PRIVATE ROOM DURING VISITING HOURS ONLY!  YOU NEED TO HAVE A COVID 19 TEST ON:02/19/20  @  2:30 PM      , THIS TEST MUST BE DONE BEFORE SURGERY, COME  [address], [place] , [address].  ([place]) ONCE YOUR COVID TEST IS COMPLETED, PLEASE BEGIN THE QUARANTINE INSTRUCTIONS AS OUTLINED IN YOUR HANDOUT.                KEONI NIEUWENHUIS     Your procedure is scheduled on: 02/22/20   Report to [facility] Main  Entrance   Report to admitting at: 7:00 AM     Call this number if you have problems the morning of surgery [phone]    Remember:    [place], NO [place].     Take these medicines the morning of surgery with A SIP OF WATER: ARIPIPRAZOLE,BUSPIRONE,GABAPENTIN,PANTOPRAZOLE,ROSUVASTATIN,SERTRALINE,VALACYCLOVIR,CHOLESTYRAMINE,XANAX AS NEEDED.                            You may not have any metal on your body including hair pins and              piercings  Do not wear jewelry, make-up, lotions, powders or perfumes, deodorant             Do not wear nail polish on your fingernails.  Do not shave  48 hours prior to surgery.                 Do not bring valuables to the hospital. [place].  Contacts, dentures or bridgework may not be worn into surgery.  Leave suitcase in the car. After surgery it may be brought to your room.     Patients discharged the day of surgery will not be allowed to drive home. IF YOU ARE HAVING SURGERY AND GOING HOME THE SAME DAY, YOU MUST HAVE AN ADULT TO DRIVE YOU HOME AND BE WITH YOU FOR 24 HOURS. YOU MAY GO HOME BY TAXI OR UBER OR ORTHERWISE, BUT AN ADULT MUST ACCOMPANY YOU HOME AND STAY WITH YOU FOR 24  HOURS.  Name and phone number of your driver:  Special Instructions: N/A              Please read over the following fact sheets you were given: _____________________________________________________________________             NO SOLID FOOD AFTER MIDNIGHT THE NIGHT PRIOR TO SURGERY. NOTHING BY MOUTH EXCEPT CLEAR LIQUIDS UNTIL: 6:00 AM . PLEASE FINISH ENSURE DRINK PER SURGEON ORDER  WHICH NEEDS TO BE COMPLETED AT: 6:00 AM .   CLEAR LIQUID DIET   Foods Allowed  Foods Excluded  Coffee and tea, regular and decaf                             liquids that you cannot  Plain Jell-O any favor except red or purple                                           see through such as: Fruit ices (not with fruit pulp)                                     milk, soups, orange juice  Iced Popsicles                                    All solid food Carbonated beverages, regular and diet                                    Cranberry, grape and apple juices Sports drinks like Gatorade Lightly seasoned clear broth or consume(fat free) Sugar, honey syrup  Sample Menu Breakfast                                Lunch                                     Supper Cranberry juice                    Beef broth                            Chicken broth Jell-O                                     Grape juice                           Apple juice Coffee or tea                        Jell-O                                      Popsicle                                                Coffee or tea                        Coffee or tea  _____________________________________________________________________  [facility] - Preparing for Surgery Before surgery, you can play an important role.  Because skin is not sterile, your skin  needs to be as free of germs as possible.  You can reduce the number of germs on your skin by washing with CHG (chlorahexidine gluconate)  soap before surgery.  CHG is an antiseptic cleaner which kills germs and bonds with the skin to continue killing germs even after washing. Please DO NOT use if you have an allergy to CHG or antibacterial soaps.  If your skin becomes reddened/irritated stop using the CHG and inform your nurse when you arrive at Short Stay. Do not shave (including legs and underarms) for at least 48 hours prior to the first CHG shower.  You may shave your face/neck. Please follow these instructions carefully:  1.  Shower with CHG Soap the night before surgery and the  morning of Surgery.  2.  If you choose to wash your hair, wash your hair first as usual with your  normal  shampoo.  3.  After you shampoo, rinse your hair and body thoroughly to remove the  shampoo.                           4.  Use CHG as you would any other liquid soap.  You can apply chg directly  to the skin and wash                       Gently with a scrungie or clean washcloth.  5.  Apply the CHG Soap to your body ONLY FROM THE NECK DOWN.   Do not use on face/ open                           Wound or open sores. Avoid contact with eyes, ears mouth and genitals (private parts).                       Wash face,  Genitals (private parts) with your normal soap.             6.  Wash thoroughly, paying special attention to the area where your surgery  will be performed.  7.  Thoroughly rinse your body with warm water from the neck down.  8.  DO NOT shower/wash with your normal soap after using and rinsing off  the CHG Soap.                9.  Pat yourself dry with a clean towel.            10.  Wear clean pajamas.            11.  Place clean sheets on your bed the night of your first shower and do not  sleep with pets. Day of Surgery : Do not apply any lotions/deodorants the morning of surgery.  Please wear clean clothes to the hospital/surgery center.  FAILURE TO FOLLOW THESE INSTRUCTIONS MAY RESULT IN THE CANCELLATION OF YOUR SURGERY PATIENT  SIGNATURE_________________________________  NURSE SIGNATURE__________________________________  ________________________________________________________________________

## 2020-02-14 NOTE — Op Note (Signed)
[facility] Patient Name: Denise King Procedure Date: 02/14/2020 MRN: [id number] Attending MD: Gatha Mayer , MD Date of Birth: 1951-11-02 CSN: [id number] Age: 69 Admit Type: Outpatient Procedure:                Colonoscopy Indications:              Chronic diarrhea, Clinically significant diarrhea                            of unexplained origin Providers:                Gatha Mayer, MD, Cleda Daub, RN, Theodora Blow, Technician Referring MD:              Medicines:                Propofol per Anesthesia, Monitored Anesthesia Care Complications:            No immediate complications. Estimated Blood Loss:     Estimated blood loss was minimal. Estimated blood                            loss was minimal. Procedure:                Pre-Anesthesia Assessment:                           - Prior to the procedure, a History and Physical                            was performed, and patient medications and                            allergies were reviewed. The patient's tolerance of                            previous anesthesia was also reviewed. The risks                            and benefits of the procedure and the sedation                            options and risks were discussed with the patient.                            All questions were answered, and informed consent                            was obtained. Prior Anticoagulants: The patient has                            taken no previous anticoagulant or antiplatelet                            agents. ASA  Grade Assessment: III - A patient with                            severe systemic disease. After reviewing the risks                            and benefits, the patient was deemed in                            satisfactory condition to undergo the procedure.                           After obtaining informed consent, the colonoscope                            was  passed under direct vision. Throughout the                            procedure, the patient's blood pressure, pulse, and                            oxygen saturations were monitored continuously. The                            CF-HQ190L [id number] Olympus colonoscope was                            introduced through the anus and advanced to the the                            terminal ileum, with identification of the                            appendiceal orifice and IC valve. The colonoscopy                            was performed without difficulty. The patient                            tolerated the procedure well. The quality of the                            bowel preparation was good. The bowel preparation                            used was Clenpiq via split dose instruction. The                            ileocecal valve, appendiceal orifice, and rectum                            were photographed. Scope In: 2:29:10 PM Scope Out: 2:43:50 PM Scope Withdrawal Time: 0 hours 9 minutes 53 seconds  Total Procedure Duration: 0 hours 14  minutes 40 seconds  Findings:      The perianal examination was normal.      The digital rectal exam findings include decreased sphincter tone.      A 1 mm polyp was found in the cecum. The polyp was sessile. The polyp       was removed with a cold biopsy forceps. Resection and retrieval were       complete. Verification of patient identification for the specimen was       done. Estimated blood loss was minimal.      The terminal ileum appeared normal.      The exam was otherwise without abnormality.      Biopsies for histology were taken with a cold forceps from the ascending       colon, transverse colon, descending colon, sigmoid colon and rectum for       evaluation of microscopic colitis. Estimated blood loss was minimal. Impression:               - Decreased sphincter tone found on digital rectal                            exam. significant and  may be part of her problem                           - One 1 mm polyp in the cecum, removed with a cold                            biopsy forceps. Resected and retrieved.                           - The examined portion of the ileum was normal.                           - The examination was otherwise normal.                           - Biopsies were taken with a cold forceps from the                            ascending colon, transverse colon, descending                            colon, sigmoid colon and rectum for evaluation of                            microscopic colitis. Moderate Sedation:      Not Applicable - Patient had care per Anesthesia. Recommendation:           - Patient has a contact number available for                            emergencies. The signs and symptoms of potential                            delayed complications were discussed with the  patient. Return to normal activities tomorrow.                            Written discharge instructions were provided to the                            patient.                           - Resume previous diet.                           - Continue present medications.                           - Repeat colonoscopy is recommended. The                            colonoscopy date will be determined after pathology                            results from today's exam become available for                            review.                           - consider pelvic floor PT to strenthen anal                            sphincter Procedure Code(s):        --- Professional ---                           [phone], Colonoscopy, flexible; with biopsy, single                            or multiple Diagnosis Code(s):        --- Professional ---                           K62.89, Other specified diseases of anus and rectum                           K63.5, Polyp of colon                           K52.9,  Noninfective gastroenteritis and colitis,                            unspecified                           R19.7, Diarrhea, unspecified CPT copyright 2019 [employer] Association. All rights reserved. The codes documented in this report are preliminary and upon coder review may  be revised to meet current compliance requirements. Gatha Mayer, MD 02/14/2020 2:58:06 PM This report has been signed electronically. Number  of Addenda: 0

## 2020-02-14 NOTE — Discharge Instructions (Signed)
I found and removed a tiny polyp - not causing a problem.  I tool colon biopsies as we discussed and once I get the results will let you know.  I appreciate the opportunity to care for you. Gatha Mayer, MD, FACG  YOU HAD AN ENDOSCOPIC PROCEDURE TODAY: Refer to the procedure report and other information in the discharge instructions given to you for any specific questions about what was found during the examination. If this information does not answer your questions, please call Dr. Celesta Aver office at [phone] to clarify.   YOU SHOULD EXPECT: Some feelings of bloating in the abdomen. Passage of more gas than usual. Walking can help get rid of the air that was put into your GI tract during the procedure and reduce the bloating. If you had a lower endoscopy (such as a colonoscopy or flexible sigmoidoscopy) you may notice spotting of blood in your stool or on the toilet paper. Some abdominal soreness may be present for a day or two, also.  DIET: Your first meal following the procedure should be a light meal and then it is ok to progress to your normal diet. A half-sandwich or bowl of soup is an example of a good first meal. Heavy or fried foods are harder to digest and may make you feel nauseous or bloated. Drink plenty of fluids but you should avoid alcoholic beverages for 24 hours.   ACTIVITY: Your care partner should take you home directly after the procedure. You should plan to take it easy, moving slowly for the rest of the day. You can resume normal activity the day after the procedure however YOU SHOULD NOT DRIVE, use power tools, machinery or perform tasks that involve climbing or major physical exertion for 24 hours (because of the sedation medicines used during the test).   SYMPTOMS TO REPORT IMMEDIATELY: A gastroenterologist can be reached at any hour. Please call [phone]  for any of the following symptoms:  Following lower endoscopy (colonoscopy, flexible  sigmoidoscopy) Excessive amounts of blood in the stool  Significant tenderness, worsening of abdominal pains  Swelling of the abdomen that is new, acute  Fever of 100 or higher

## 2020-02-14 NOTE — Anesthesia Preprocedure Evaluation (Addendum)
Anesthesia Evaluation  Patient identified by MRN, date of birth, ID band Patient awake    Reviewed: Allergy & Precautions, NPO status , Patient's Chart, lab work & pertinent test results  Airway Mallampati: II  TM Distance: >3 FB Neck ROM: Full    Dental  (+) Teeth Intact, Dental Advisory Given, Caps   Pulmonary Current SmokerPatient did not abstain from smoking.,    Pulmonary exam normal breath sounds clear to auscultation       Cardiovascular hypertension, +CHF  Normal cardiovascular exam Rhythm:Regular Rate:Normal  '20 Cath - Mid RCA lesion is 50% stenosed. Prox Cx to Mid Cx lesion is 30% stenosed. Prox LAD to Mid LAD lesion is 50% stenosed. LV end diastolic pressure is low.  '20 TTE - EF 60-65%. Left ventricular diastolic Doppler parameters are consistent with impaired relaxation. No valvulopathy identified.   Neuro/Psych  Headaches, PSYCHIATRIC DISORDERS Anxiety Depression Bipolar Disorder  Neuromuscular disease    GI/Hepatic PUD, GERD  Medicated,(+)     substance abuse  alcohol use,   Endo/Other  negative endocrine ROS  Renal/GU negative Renal ROS     Musculoskeletal  (+) Arthritis ,   Abdominal   Peds  Hematology negative hematology ROS (+)   Anesthesia Other Findings Day of surgery medications reviewed with the patient.  Reproductive/Obstetrics                            Anesthesia Physical Anesthesia Plan  ASA: III  Anesthesia Plan: MAC   Post-op Pain Management:    Induction: Intravenous  PONV Risk Score and Plan: 1 and Propofol infusion and Treatment may vary due to age or medical condition  Airway Management Planned: Nasal Cannula and Natural Airway  Additional Equipment:   Intra-op Plan:   Post-operative Plan:   Informed Consent: I have reviewed the patients History and Physical, chart, labs and discussed the procedure including the risks, benefits and  alternatives for the proposed anesthesia with the patient or authorized representative who has indicated his/her understanding and acceptance.     Dental advisory given  Plan Discussed with: CRNA and Anesthesiologist  Anesthesia Plan Comments:         Anesthesia Quick Evaluation

## 2020-02-14 NOTE — Interval H&P Note (Signed)
History and Physical Interval Note:  02/14/2020 1:42 PM  Denise King  has presented today for surgery, with the diagnosis of diarrhea.  The various methods of treatment have been discussed with the patient and family. After consideration of risks, benefits and other options for treatment, the patient has consented to  Procedure(s): COLONOSCOPY WITH PROPOFOL (N/A) as a surgical intervention.  The patient's history has been reviewed, patient examined, no change in status, stable for surgery.  I have reviewed the patient's chart and labs.  Questions were answered to the patient's satisfaction.     Silvano Rusk

## 2020-02-15 ENCOUNTER — Encounter (HOSPITAL_COMMUNITY)
Admission: RE | Admit: 2020-02-15 | Discharge: 2020-02-15 | Disposition: A | Discharge status: Home / Self Care | Payer: Medicare HMO | Source: Ambulatory Visit | Attending: General Surgery | Admitting: General Surgery

## 2020-02-15 ENCOUNTER — Encounter (HOSPITAL_COMMUNITY): Payer: Self-pay

## 2020-02-15 ENCOUNTER — Other Ambulatory Visit: Payer: Self-pay

## 2020-02-15 DIAGNOSIS — Z01818 Encounter for other preprocedural examination: Secondary | ICD-10-CM | POA: Diagnosis present

## 2020-02-15 HISTORY — DX: Heart failure, unspecified: I50.9

## 2020-02-15 LAB — SURGICAL PATHOLOGY

## 2020-02-15 NOTE — Progress Notes (Signed)
PCP - Dr. Cathlean Cower. LOV: 01/11/20. Cardiologist - Dr. Johnsie Cancel. LOV: 02/01/20. EPIC   Chest x-ray - 12/30/19. EPIC EKG - 09/11/19. EPIC. Stress Test -  ECHO - 02/01/20. EPIC Cardiac Cath -   Sleep Study -  CPAP -   Fasting Blood Sugar -  Checks Blood Sugar _____ times a day  Blood Thinner Instructions: Aspirin Instructions: Last Dose:  Anesthesia review: RN advised Pt. To stop smoking before surgery,pt. Verbalized understanding of this subject.  Patient denies shortness of breath, fever, cough and chest pain at PAT appointment   Patient verbalized understanding of instructions that were given to them at the PAT appointment. Patient was also instructed that they will need to review over the PAT instructions again at home before surgery.

## 2020-02-16 ENCOUNTER — Encounter: Payer: Self-pay | Admitting: Internal Medicine

## 2020-02-16 ENCOUNTER — Other Ambulatory Visit: Payer: Self-pay

## 2020-02-16 DIAGNOSIS — K52832 Lymphocytic colitis: Secondary | ICD-10-CM | POA: Insufficient documentation

## 2020-02-16 DIAGNOSIS — M25571 Pain in right ankle and joints of right foot: Secondary | ICD-10-CM | POA: Diagnosis not present

## 2020-02-16 DIAGNOSIS — M25572 Pain in left ankle and joints of left foot: Secondary | ICD-10-CM | POA: Diagnosis not present

## 2020-02-16 HISTORY — DX: Lymphocytic colitis: K52.832

## 2020-02-16 MED ORDER — BUDESONIDE 3 MG PO CPEP: 9.0000 mg | ORAL_CAPSULE | Freq: Every day | ORAL | 1 refills | Status: DC

## 2020-02-16 NOTE — Progress Notes (Signed)
Please let her know that the biopsies do show a microscopic colitis. Explain we treat that with budesonide  Rx budesonide 3 mg take 3 caps or 9 mg daily #90 1 refill  F/u me in 4-6 weeks  Polyp was benign but precancerous - 7 year colon recall from now - cancel any existing recalls

## 2020-02-19 ENCOUNTER — Other Ambulatory Visit: Payer: Self-pay

## 2020-02-19 ENCOUNTER — Ambulatory Visit (INDEPENDENT_AMBULATORY_CARE_PROVIDER_SITE_OTHER): Payer: Medicare HMO | Admitting: Psychiatry

## 2020-02-19 ENCOUNTER — Other Ambulatory Visit (HOSPITAL_COMMUNITY)
Admission: RE | Admit: 2020-02-19 | Discharge: 2020-02-19 | Disposition: A | Discharge status: Home / Self Care | Payer: Medicare HMO | Source: Ambulatory Visit | Attending: General Surgery | Admitting: General Surgery

## 2020-02-19 ENCOUNTER — Telehealth: Payer: Self-pay

## 2020-02-19 ENCOUNTER — Encounter (HOSPITAL_COMMUNITY)
Admission: RE | Admit: 2020-02-19 | Discharge: 2020-02-19 | Disposition: A | Discharge status: Home / Self Care | Payer: Medicare HMO | Source: Ambulatory Visit | Attending: General Surgery | Admitting: General Surgery

## 2020-02-19 DIAGNOSIS — I509 Heart failure, unspecified: Secondary | ICD-10-CM | POA: Diagnosis not present

## 2020-02-19 DIAGNOSIS — F329 Major depressive disorder, single episode, unspecified: Secondary | ICD-10-CM | POA: Insufficient documentation

## 2020-02-19 DIAGNOSIS — R69 Illness, unspecified: Secondary | ICD-10-CM | POA: Diagnosis not present

## 2020-02-19 DIAGNOSIS — Z01812 Encounter for preprocedural laboratory examination: Secondary | ICD-10-CM | POA: Diagnosis not present

## 2020-02-19 DIAGNOSIS — K219 Gastro-esophageal reflux disease without esophagitis: Secondary | ICD-10-CM | POA: Insufficient documentation

## 2020-02-19 DIAGNOSIS — F341 Dysthymic disorder: Secondary | ICD-10-CM | POA: Diagnosis not present

## 2020-02-19 DIAGNOSIS — R899 Unspecified abnormal finding in specimens from other organs, systems and tissues: Secondary | ICD-10-CM

## 2020-02-19 DIAGNOSIS — K8050 Calculus of bile duct without cholangitis or cholecystitis without obstruction: Secondary | ICD-10-CM | POA: Diagnosis not present

## 2020-02-19 DIAGNOSIS — Z20822 Contact with and (suspected) exposure to covid-19: Secondary | ICD-10-CM | POA: Diagnosis not present

## 2020-02-19 DIAGNOSIS — Z79899 Other long term (current) drug therapy: Secondary | ICD-10-CM | POA: Diagnosis not present

## 2020-02-19 DIAGNOSIS — F17210 Nicotine dependence, cigarettes, uncomplicated: Secondary | ICD-10-CM | POA: Insufficient documentation

## 2020-02-19 DIAGNOSIS — D510 Vitamin B12 deficiency anemia due to intrinsic factor deficiency: Secondary | ICD-10-CM | POA: Insufficient documentation

## 2020-02-19 DIAGNOSIS — I110 Hypertensive heart disease with heart failure: Secondary | ICD-10-CM | POA: Diagnosis not present

## 2020-02-19 DIAGNOSIS — M810 Age-related osteoporosis without current pathological fracture: Secondary | ICD-10-CM | POA: Insufficient documentation

## 2020-02-19 LAB — COMPREHENSIVE METABOLIC PANEL
ALT: 8 U/L (ref 0–44)
AST: 22 U/L (ref 15–41)
Albumin: 3.6 g/dL (ref 3.5–5.0)
Alkaline Phosphatase: 84 U/L (ref 38–126)
Anion gap: 11 (ref 5–15)
BUN: 6 mg/dL — ABNORMAL LOW (ref 8–23)
CO2: 37 mmol/L — ABNORMAL HIGH (ref 22–32)
Calcium: 8.7 mg/dL — ABNORMAL LOW (ref 8.9–10.3)
Chloride: 95 mmol/L — ABNORMAL LOW (ref 98–111)
Creatinine, Ser: 0.83 mg/dL (ref 0.44–1.00)
GFR calc Af Amer: >60 mL/min (ref >60)
GFR calc non Af Amer: >60 mL/min (ref >60)
Glucose, Bld: 104 mg/dL — ABNORMAL HIGH (ref 70–99)
Potassium: 2.5 mmol/L — CL (ref 3.5–5.1)
Sodium: 143 mmol/L (ref 135–145)
Total Bilirubin: 0.2 mg/dL — ABNORMAL LOW (ref 0.3–1.2)
Total Protein: 6.8 g/dL (ref 6.5–8.1)

## 2020-02-19 LAB — CBC WITH DIFFERENTIAL/PLATELET
Abs Immature Granulocytes: 0.02 10*3/uL (ref 0.00–0.07)
Basophils Absolute: 0 10*3/uL (ref 0.0–0.1)
Basophils Relative: 0 %
Eosinophils Absolute: 0.1 10*3/uL (ref 0.0–0.5)
Eosinophils Relative: 1 %
HCT: 43.2 % (ref 36.0–46.0)
Hemoglobin: 13.9 g/dL (ref 12.0–15.0)
Immature Granulocytes: 0 %
Lymphocytes Relative: 37 %
Lymphs Abs: 2.8 10*3/uL (ref 0.7–4.0)
MCH: 30.8 pg (ref 26.0–34.0)
MCHC: 32.2 g/dL (ref 30.0–36.0)
MCV: 95.8 fL (ref 80.0–100.0)
Monocytes Absolute: 0.7 10*3/uL (ref 0.1–1.0)
Monocytes Relative: 9 %
Neutro Abs: 3.9 10*3/uL (ref 1.7–7.7)
Neutrophils Relative %: 53 %
Platelets: 285 10*3/uL (ref 150–400)
RBC: 4.51 MIL/uL (ref 3.87–5.11)
RDW: 13.1 % (ref 11.5–15.5)
WBC: 7.5 10*3/uL (ref 4.0–10.5)
nRBC: 0 % (ref 0.0–0.2)

## 2020-02-19 LAB — SARS CORONAVIRUS 2 (TAT 6-24 HRS): SARS Coronavirus 2: NEGATIVE

## 2020-02-19 MED ORDER — ARIPIPRAZOLE 2 MG PO TABS: 2.0000 mg | ORAL_TABLET | Freq: Every day | ORAL | 0 refills | Status: DC

## 2020-02-19 MED ORDER — SERTRALINE HCL 100 MG PO TABS: 200.0000 mg | ORAL_TABLET | Freq: Every day | ORAL | 0 refills | Status: DC

## 2020-02-19 NOTE — Telephone Encounter (Signed)
This would normally be the responsibility of the provider who ordered the testing, which apparently was done preop, so I should not get involved

## 2020-02-19 NOTE — Telephone Encounter (Signed)
I will call pt in the morning to follow up with PCP. Pt surgery will probably be canceled due to abnormal lab.

## 2020-02-19 NOTE — Telephone Encounter (Signed)
Jessica PA at [facility] called with [facility], potassium was 2.5

## 2020-02-19 NOTE — Progress Notes (Signed)
BH MD/PA/NP OP Progress Note  02/19/2020 1:38 PM Denise King  MRN:  [id number] Interview was conducted by phone and I verified that I was speaking with the correct person using two identifiers. I discussed the limitations of evaluation and management by telemedicine and  the availability of in person appointments. Patient expressed understanding and agreed to proceed.  Chief Complaint: "I am doing fairly well".  HPI: 69yo female with chronic anxiety/depression which worsened with increased physical problems/pain. She has been for a long time on Cymbalta - we have cross tapered it to sertraline which she toleratedwell and dose has been increased to 150 mg as she continued to reports feeling anxious and depressed.She was taken off clonazepam out of concern about combining benzodiazepines with narcotic analgesics and instead buspirone was added. She finds it not very effective but continues to take it. We have added a low dose of aripiprazole to augment sertraline and [name] reports that her mood started to improve. While in the hospital in December she was taken off hydroxyzine - she takes melatonin now and her sleep is adequate. [name] will have laparoscopic cholecystectomy later this week.   Visit Diagnosis:    ICD-10-CM   1. DYSTHYMIA  F34.1     Past Psychiatric History: Please see intake H&P.  Past Medical History:  Past Medical History:  Diagnosis Date  . Acute cystitis   . Acute respiratory failure ([place])   . Allergy    as a child grew out of them  . Anemia    pernicious anemia  . Anxiety   . Arthritis   . Back pain   . Blood transfusion   . CAP (community acquired pneumonia)   . CHF (congestive heart failure) ([place])   . Chronic pain syndrome   . Common bile duct stone   . Depression   . Diverticulosis of colon   . Duodenal ulcer hemorrhagic-after biliary sphincterotomy   . Dysthymia   . Esophagitis   . EtOH dependence ([place])   . Gastritis   . GERD  (gastroesophageal reflux disease)   . H/O chest pain Dec. 2013   no work up done  . Hx of colonic polyps   . Hypertension   . Irritable bowel syndrome   . Lymphocytic colitis 02/16/2020  . Osteopenia   . Osteoporosis   . Pneumonia 2019  . Polypharmacy   . Reflux esophagitis   . Right sided sciatica 12/22/2017  . Tobacco use disorder   . Unspecified chronic bronchitis ([place])   . Vertigo   . Vitamin B12 deficiency   . Wears glasses     Past Surgical History:  Procedure Laterality Date  . APPENDECTOMY    . BACK SURGERY  10-09   Dr. Patrice Paradise  . BIOPSY  02/14/2020   Procedure: BIOPSY;  Surgeon: Gatha Mayer, MD;  Location: [facility];  Service: Endoscopy;;  . CARPAL TUNNEL RELEASE Right 08/14/2014   Procedure: RIGHT CARPAL TUNNEL RELEASE AND INJECT LEFT THUMB;  Surgeon: Daryll Brod, MD;  Location: [place];  Service: Orthopedics;  Laterality: Right;  . COLONOSCOPY    . COLONOSCOPY WITH PROPOFOL N/A 02/14/2020   Procedure: COLONOSCOPY WITH PROPOFOL;  Surgeon: Gatha Mayer, MD;  [facility];  Service: Endoscopy;  Laterality: N/A;  . ENDOSCOPIC RETROGRADE CHOLANGIOPANCREATOGRAPHY (ERCP) WITH PROPOFOL N/A 12/25/2019   Procedure: ENDOSCOPIC RETROGRADE CHOLANGIOPANCREATOGRAPHY (ERCP) WITH PROPOFOL;  Surgeon: Gatha Mayer, MD;  Location: [place];  Service: Gastroenterology;  Laterality: N/A;  . ESOPHAGOGASTRODUODENOSCOPY (EGD)  WITH PROPOFOL N/A 01/06/2020   Procedure: ESOPHAGOGASTRODUODENOSCOPY (EGD) WITH PROPOFOL;  Surgeon: Irene Shipper, MD;  Location: [facility] ENDOSCOPY;  Service: Endoscopy;  Laterality: N/A;  . HOT HEMOSTASIS N/A 01/06/2020   Procedure: HOT HEMOSTASIS (ARGON PLASMA COAGULATION/BICAP);  Surgeon: Irene Shipper, MD;  Location: [facility] ENDOSCOPY;  Service: Endoscopy;  Laterality: N/A;  . INTRAMEDULLARY (IM) NAIL INTERTROCHANTERIC Right 10/01/2018   Procedure: INTRAMEDULLARY (IM) NAIL INTERTROCHANTRIC;  Surgeon: Thornton Park, MD;  Location: [facility] ORS;   Service: Orthopedics;  Laterality: Right;  . LAPAROSCOPY N/A 02/21/2013   Procedure: LAPAROSCOPY OPERATIVE;  Surgeon: Margarette Asal, MD;  Location: [place] [facility];  Service: Gynecology;  Laterality: N/A;  REQUESTING 5MM SCOPE WITH CAMERA  . LEFT HEART CATH AND CORONARY ANGIOGRAPHY N/A 05/11/2019   Procedure: LEFT HEART CATH AND CORONARY ANGIOGRAPHY;  Surgeon: Sherren Mocha, MD;  Location: [place] CV LAB;  Service: Cardiovascular;  Laterality: N/A;  . REMOVAL OF STONES  12/25/2019   Procedure: Karlyn Agee;  Surgeon: Gatha Mayer, MD;  Location: [facility] ENDOSCOPY;  Service: Gastroenterology;;  . Clide Deutscher  01/06/2020   Procedure: Clide Deutscher;  Surgeon: Irene Shipper, MD;  Location: [facility] ENDOSCOPY;  Service: Endoscopy;;  . Joan Mayans  12/25/2019   Procedure: SPHINCTEROTOMY;  Surgeon: Gatha Mayer, MD;  Location: [place];  Service: Gastroenterology;;  . TONSILLECTOMY    . TUBAL LIGATION    . UPPER GASTROINTESTINAL ENDOSCOPY      Family Psychiatric History: Reviewed.  Family History:  Family History  Problem Relation Age of Onset  . Colon cancer Father   . Prostate cancer Father   . Heart disease Mother        prev MVR, also has DJD  . Colon cancer Brother   . Skin cancer Sister   . Alcohol abuse Daughter   . Bipolar disorder Daughter   . Drug abuse Daughter   . Esophageal cancer Neg Hx   . Rectal cancer Neg Hx   . Stomach cancer Neg Hx     Social History:  Social History   Socioeconomic History  . Marital status: Married    Spouse name: Not on file  . Number of children: 4  . Years of education: 43  . Highest education level: Not on file  Occupational History  . Occupation: retired    Comment: disablility  Tobacco Use  . Smoking status: Current Every Day Smoker    Packs/day: 1.00    Years: 30.00    Pack years: 30.00    Types: Cigarettes  . Smokeless tobacco: Never Used  Substance and Sexual Activity  . Alcohol use: Yes    Alcohol/week: 3.0 standard  drinks    Types: 3 Standard drinks or equivalent per week    Comment: occasional  . Drug use: No  . Sexual activity: Yes  Other Topics Concern  . Not on file  Social History Narrative   Married x 38 yrs. Has BS degree in Horticulture from [employer]. Currently resides in a house with her husband. 3 cats.  Fun: used to like to do a lot of things.    Denies religious beliefs that would effect health care.    lives with husband [name]; quit smoking- October 6th, 2019. ocasional alcohol. [employer] [2009] on disability sec to back issues.    Social Determinants of Health   Financial Resource Strain: Low Risk   . Difficulty of Paying Living Expenses: Not very hard  Food Insecurity: No Food Insecurity  . Worried About [employer] of  Food in the Last Year: Never true  . Ran Out of Food in the Last Year: Never true  Transportation Needs: No Transportation Needs  . Lack of Transportation (Medical): No  . Lack of Transportation (Non-Medical): No  Physical Activity: Inactive  . Days of Exercise per Week: 0 days  . Minutes of Exercise per Session: 0 min  Stress: Stress Concern Present  . Feeling of Stress : Rather much  Social Connections: Somewhat Isolated  . Frequency of Communication with Friends and Family: More than three times a week  . Frequency of Social Gatherings with Friends and Family: Once a week  . Attends Religious Services: Never  . Active Member of Clubs or Organizations: No  . Attends Archivist Meetings: Never  . Marital Status: Married    Allergies:  Allergies  Allergen Reactions  . Atorvastatin Nausea And Vomiting  . Levofloxacin Other (See Comments)    Reaction: achilles tendon pain  . Omnicef [Cefdinir] Diarrhea    Uncontrollable diarrhea  . Trazodone And Nefazodone Other (See Comments)    "Felt like I was going to faint"  . Sulfa Antibiotics Rash  . Sulfonamide Derivatives Rash    Metabolic Disorder Labs: Lab Results  Component  Value Date   HGBA1C 5.2 10/01/2018   MPG 103 10/01/2018   No results found for: PROLACTIN Lab Results  Component Value Date   CHOL 126 12/04/2019   TRIG 93 12/04/2019   HDL 62 12/04/2019   CHOLHDL 2.0 12/04/2019   VLDL 10 04/09/2019   LDLCALC 47 12/04/2019   LDLCALC 71 04/09/2019   Lab Results  Component Value Date   TSH 0.499 04/08/2019   TSH 1.108 10/01/2018    Therapeutic Level Labs: No results found for: LITHIUM No results found for: VALPROATE No components found for:  CBMZ  Current Medications: Current Outpatient Medications  Medication Sig Dispense Refill  . ALPRAZolam (XANAX) 0.25 MG tablet Take 1 tablet (0.25 mg total) by mouth 3 (three) times daily as needed for anxiety. 30 tablet 0  . ARIPiprazole (ABILIFY) 2 MG tablet Take 1 tablet (2 mg total) by mouth daily. 90 tablet 0  . budesonide (ENTOCORT EC) 3 MG 24 hr capsule Take 3 capsules (9 mg total) by mouth daily. 90 capsule 1  . busPIRone (BUSPAR) 10 MG tablet Take 1 tablet (10 mg total) by mouth 3 (three) times daily. 90 tablet 5  . Cholecalciferol (VITAMIN D) 50 MCG (2000 UT) tablet Take 2,000 Units by mouth daily after breakfast.     . cholestyramine (QUESTRAN) 4 GM/DOSE powder Take 1 packet (4 g total) by mouth 2 (two) times daily with a meal. (Patient taking differently: Take 4 g by mouth 2 (two) times daily as needed ([name] (take with meals)). ) 378 g 12  . denosumab (PROLIA) 60 MG/ML SOSY injection Inject 60 mg into the skin every 6 (six) months.    . folic acid (FOLVITE) 1 MG tablet Take 1 tablet (1 mg total) by mouth daily. (Patient taking differently: Take 1 mg by mouth daily after breakfast. ) 90 tablet 3  . furosemide (LASIX) 20 MG tablet Take 1 tablet by mouth daily, take extra tablet by mouth daily as needed for edema or shortness of breath. (Patient taking differently: Take 20 mg by mouth daily. Take 1 tablet by mouth daily, take extra tablet by mouth daily as needed for edema or shortness of breath.) 45  tablet 11  . gabapentin (NEURONTIN) 300 MG capsule Take 300 mg by  mouth 3 (three) times daily.    . hyoscyamine (ANASPAZ) 0.125 MG TBDP disintergrating tablet Place 0.125 mg under the tongue every 6 (six) hours as needed ([name] cramps).     . morphine (MS CONTIN) 60 MG 12 hr tablet Take 60 mg by mouth every 12 (twelve) hours.    . Multiple Vitamin (MULTIVITAMIN WITH MINERALS) TABS tablet Take 1 tablet by mouth daily. (Patient not taking: Reported on 02/08/2020) 30 tablet 0  . nicotine (NICODERM CQ - DOSED IN MG/24 HOURS) 21 mg/24hr patch Place 1 patch (21 mg total) onto the skin daily. 28 patch 0  . Oxycodone HCl 10 MG TABS Take 10 mg by mouth 4 (four) times daily.     . OXYGEN Inhale 2 L/min into the lungs as needed.    . pantoprazole (PROTONIX) 40 MG tablet Take 1 tablet (40 mg total) by mouth 2 (two) times daily before a meal. 60 tablet 1  . potassium chloride (KLOR-CON) 10 MEQ tablet TAKE 1 TABLET ONCE DAILY. (Patient taking differently: Take 10 mEq by mouth daily. ) 30 tablet 5  . rosuvastatin (CRESTOR) 5 MG tablet Take 1 tablet (5 mg total) by mouth every Monday, Wednesday, and Friday at 6 PM. 36 tablet 3  . sertraline (ZOLOFT) 100 MG tablet Take 2 tablets (200 mg total) by mouth at bedtime. 180 tablet 0  . valACYclovir (VALTREX) 500 MG tablet Take 500 mg by mouth 2 (two) times daily as needed (flare ups).    . vitamin B-12 (CYANOCOBALAMIN) 100 MCG tablet Take 1 tablet (100 mcg total) by mouth daily. (Patient taking differently: Take 100 mcg by mouth daily after breakfast. ) 90 tablet 3   No current facility-administered medications for this visit.   Facility-Administered Medications Ordered in Other Visits  Medication Dose Route Frequency Provider Last Rate Last Admin  . sodium chloride flush (NS) 0.9 % injection 3 mL  3 mL Intravenous Q12H Josue Hector, MD         Psychiatric Specialty Exam: Review of Systems  Constitutional: Positive for fatigue.  Musculoskeletal: Positive for back  pain.  All other systems reviewed and are negative.   There were no vitals taken for this visit.There is no height or weight on file to calculate BMI.  General Appearance: NA  Eye Contact:  NA  Speech:  Clear and Coherent and Normal Rate  Volume:  Normal  Mood:  Euthymic  Affect:  NA  Thought Process:  Goal Directed and Linear  Orientation:  Full (Time, Place, and Person)  Thought Content: Logical   Suicidal Thoughts:  No  Homicidal Thoughts:  No  Memory:  Immediate;   Good Recent;   Good Remote;   Good  Judgement:  Good  Insight:  Good  Psychomotor Activity:  NA  Concentration:  Concentration: Good  Recall:  Good  Fund of Knowledge: Good  Language: Good  Akathisia:  Negative  Handed:  Right  AIMS (if indicated): not done  Assets:  Communication Skills Desire for Improvement Financial Resources/Insurance Housing Social Support  ADL's:  Intact  Cognition: WNL  Sleep:  Fair   Screenings: PHQ2-9     Office Visit from 09/11/2019 in [place] Office Visit from 10/25/2015 in Primary [facility] at [facility] Total Score  1  3  PHQ-9 Total Score  --  18       Assessment and Plan: 69yo female with chronic anxiety/depression which worsened with increased physical problems/pain. She has been for a  long time on Cymbalta - we have cross tapered it to sertraline which she toleratedwell and dose has been increased to 150 mg as she continued to reports feeling anxious and depressed.She was taken off clonazepam out of concern about combining benzodiazepines with narcotic analgesics and instead buspirone was added. She finds it not very effective but continues to take it. We have added a low dose of aripiprazole to augment sertraline and [name] reports that her mood started to improve. While in the hospital in December she was taken off hydroxyzine - she takes melatonin now and her sleep is adequate. [name] will have laparoscopic cholecystectomy later this  week.   [id number] disorder; Chronic pain disorder  Plan:ContinueZoloftto 200 mg at HS, buspirone and Abilify 2 mg in AM. Next appointment in 2 months. The plan was discussed with patient who had an opportunity to ask questions and these were all answered. I spend25 minutes inphone consultation with the patient.   Stephanie Acre, MD 02/19/2020, 1:38 PM

## 2020-02-19 NOTE — Telephone Encounter (Signed)
LVM for pt to call back as soon as possible.   RE: critical lab -

## 2020-02-20 ENCOUNTER — Other Ambulatory Visit: Payer: Medicare HMO

## 2020-02-20 DIAGNOSIS — I739 Peripheral vascular disease, unspecified: Secondary | ICD-10-CM | POA: Diagnosis not present

## 2020-02-20 DIAGNOSIS — Z008 Encounter for other general examination: Secondary | ICD-10-CM | POA: Diagnosis not present

## 2020-02-20 DIAGNOSIS — E785 Hyperlipidemia, unspecified: Secondary | ICD-10-CM | POA: Diagnosis not present

## 2020-02-20 DIAGNOSIS — J449 Chronic obstructive pulmonary disease, unspecified: Secondary | ICD-10-CM | POA: Diagnosis not present

## 2020-02-20 DIAGNOSIS — I509 Heart failure, unspecified: Secondary | ICD-10-CM | POA: Diagnosis not present

## 2020-02-20 DIAGNOSIS — B0223 Postherpetic polyneuropathy: Secondary | ICD-10-CM | POA: Diagnosis not present

## 2020-02-20 DIAGNOSIS — R69 Illness, unspecified: Secondary | ICD-10-CM | POA: Diagnosis not present

## 2020-02-20 NOTE — Progress Notes (Signed)
Anesthesia Chart Review   Case: [id number] Date/Time: 02/22/20 0845   Procedure: LAPAROSCOPIC CHOLECYSTECTOMY WITH INTRAOPERATIVE CHOLANGIOGRAM (N/A )   Anesthesia type: General   Pre-op diagnosis: CHOLEDOCHOLITHIASIS   [facility]   Surgeons: Kinsinger, Arta Bruce, MD      DISCUSSION:68 y.o. current every day smoker (30 pack years) with h/o HTN, CHF, GERD, alcohol use, choledocholithiasis scheduled for above procedure 02/22/2020 with Dr. Gurney Maxin.   Potassium 2.5 at PAT visit 02/19/2020.  Surgeon informed.  PCP informed.  PCP, Dr. Cathlean Cower, responded, "This would normally be the responsibility of the provider who ordered the testing, which apparently was done preop, so I should not get involved."  I called Dr. Amie Portland office again to make sure that someone would manage this.  Will recheck Potassium DOS.   Pt last seen by cardiologist, Dr. Jenkins Rouge, 01/22/2020.  Per OV note, "Biliary colic needs f/u with general surgery for ? Laparoscopic surgery No further GI bleeding post injection of sphincterotomy site Clear to have laparoscopic GB surgery Seeing surgeon Friday."  Anticipate pt can proceed with planned procedure barring acute status change.   VS: BP 117/60   Pulse 83   Temp 36.9 C (Oral)   Resp (!) 156   Ht 5\' 1"  (1.549 m)   Wt 52.8 kg   SpO2 97%   BMI 21.97 kg/m   PROVIDERS: Biagio Borg, MD is PCP   Jenkins Rouge, MD is Cardiologist  LABS: Critical Potassium called into surgeon and PCP (all labs ordered are listed, but only abnormal results are displayed)  Labs Reviewed  COMPREHENSIVE METABOLIC PANEL - Abnormal; Notable for the following components:      Result Value   Potassium 2.5 (*)    Chloride 95 (*)    CO2 37 (*)    Glucose, Bld 104 (*)    BUN 6 (*)    Calcium 8.7 (*)    Total Bilirubin 0.2 (*)    All other components within normal limits  CBC WITH DIFFERENTIAL/PLATELET     IMAGES:   EKG: 09/11/2019 Rate 82 bpm Sinus  rhythm  Right atrial enlargement Right axis deviation   CV: Echo 02/01/2020 IMPRESSIONS    1. Left ventricular ejection fraction, by visual estimation, is 60 to  65%. The left ventricle has normal function. There is mildly increased  left ventricular hypertrophy.  2. Elevated left atrial pressure.  3. Left ventricular diastolic parameters are consistent with Grade II  diastolic dysfunction (pseudonormalization).  4. The left ventricle has no regional wall motion abnormalities.  5. Global right ventricle has normal systolic function.The right  ventricular size is normal. No increase in right ventricular wall  thickness.  6. Left atrial size was mildly dilated.  7. Right atrial size was normal.  8. The mitral valve is normal in structure. Mild mitral valve  regurgitation. No evidence of mitral stenosis.  9. The tricuspid valve is normal in structure.  10. The tricuspid valve is normal in structure. Tricuspid valve  regurgitation is mild.  11. The aortic valve is normal in structure. Aortic valve regurgitation is  not visualized. No evidence of aortic valve sclerosis or stenosis.  12. The pulmonic valve was normal in structure. Pulmonic valve  regurgitation is not visualized.  13. Normal pulmonary artery systolic pressure.  14. The inferior vena cava is normal in size with greater than 50%  respiratory variability, suggesting right atrial pressure of 3 mmHg.  15. The average left ventricular global  longitudinal strain is -20.3 %.  Cardiac Cath 05/11/2019  Mid RCA lesion is 50% stenosed.  Prox Cx to Mid Cx lesion is 30% stenosed.  Prox LAD to Mid LAD lesion is 50% stenosed.  LV end diastolic pressure is low.   1.  Mild to moderate diffuse calcified stenosis of the proximal to mid LAD 2.  Widely patent left main and left circumflex with calcified nonobstructive disease 3.  Mild to moderate focal stenosis of the mid RCA, with lesion no greater than 50% does not appear  flow-limiting 4.  Low LVEDP of 4 mmHg  Recommendations: Recommend medical therapy.  There is no target lesion for PCI.  The patient is a heavy smoker and tobacco cessation is recommended. Past Medical History:  Diagnosis Date  . Acute cystitis   . Acute respiratory failure ([place])   . Allergy    as a child grew out of them  . Anemia    pernicious anemia  . Anxiety   . Arthritis   . Back pain   . Blood transfusion   . CAP (community acquired pneumonia)   . CHF (congestive heart failure) ([place])   . Chronic pain syndrome   . Common bile duct stone   . Depression   . Diverticulosis of colon   . Duodenal ulcer hemorrhagic-after biliary sphincterotomy   . Dysthymia   . Esophagitis   . EtOH dependence ([place])   . Gastritis   . GERD (gastroesophageal reflux disease)   . H/O chest pain Dec. 2013   no work up done  . Hx of colonic polyps   . Hypertension   . Irritable bowel syndrome   . Lymphocytic colitis 02/16/2020  . Osteopenia   . Osteoporosis   . Pneumonia 2019  . Polypharmacy   . Reflux esophagitis   . Right sided sciatica 12/22/2017  . Tobacco use disorder   . Unspecified chronic bronchitis ([place])   . Vertigo   . Vitamin B12 deficiency   . Wears glasses     Past Surgical History:  Procedure Laterality Date  . APPENDECTOMY    . BACK SURGERY  10-09   Dr. Patrice Paradise  . BIOPSY  02/14/2020   Procedure: BIOPSY;  Surgeon: Gatha Mayer, MD;  Location: [facility];  Service: Endoscopy;;  . CARPAL TUNNEL RELEASE Right 08/14/2014   Procedure: RIGHT CARPAL TUNNEL RELEASE AND INJECT LEFT THUMB;  Surgeon: Daryll Brod, MD;  Location: [place];  Service: Orthopedics;  Laterality: Right;  . COLONOSCOPY    . COLONOSCOPY WITH PROPOFOL N/A 02/14/2020   Procedure: COLONOSCOPY WITH PROPOFOL;  Surgeon: Gatha Mayer, MD;  [facility];  Service: Endoscopy;  Laterality: N/A;  . ENDOSCOPIC RETROGRADE CHOLANGIOPANCREATOGRAPHY (ERCP) WITH PROPOFOL N/A 12/25/2019    Procedure: ENDOSCOPIC RETROGRADE CHOLANGIOPANCREATOGRAPHY (ERCP) WITH PROPOFOL;  Surgeon: Gatha Mayer, MD;  Location: [place];  Service: Gastroenterology;  Laterality: N/A;  . ESOPHAGOGASTRODUODENOSCOPY (EGD) WITH PROPOFOL N/A 01/06/2020   Procedure: ESOPHAGOGASTRODUODENOSCOPY (EGD) WITH PROPOFOL;  Surgeon: Irene Shipper, MD;  Location: [facility] ENDOSCOPY;  Service: Endoscopy;  Laterality: N/A;  . HOT HEMOSTASIS N/A 01/06/2020   Procedure: HOT HEMOSTASIS (ARGON PLASMA COAGULATION/BICAP);  Surgeon: Irene Shipper, MD;  Location: [facility] ENDOSCOPY;  Service: Endoscopy;  Laterality: N/A;  . INTRAMEDULLARY (IM) NAIL INTERTROCHANTERIC Right 10/01/2018   Procedure: INTRAMEDULLARY (IM) NAIL INTERTROCHANTRIC;  Surgeon: Thornton Park, MD;  Location: [facility] ORS;  Service: Orthopedics;  Laterality: Right;  . LAPAROSCOPY N/A 02/21/2013   Procedure: LAPAROSCOPY OPERATIVE;  Surgeon: Delfino Lovett  Garry Heater, MD;  Location: [facility];  Service: Gynecology;  Laterality: N/A;  REQUESTING 5MM SCOPE WITH CAMERA  . LEFT HEART CATH AND CORONARY ANGIOGRAPHY N/A 05/11/2019   Procedure: LEFT HEART CATH AND CORONARY ANGIOGRAPHY;  Surgeon: Sherren Mocha, MD;  Location: [place] CV LAB;  Service: Cardiovascular;  Laterality: N/A;  . REMOVAL OF STONES  12/25/2019   Procedure: Karlyn Agee;  Surgeon: Gatha Mayer, MD;  Location: [facility] ENDOSCOPY;  Service: Gastroenterology;;  . Clide Deutscher  01/06/2020   Procedure: Clide Deutscher;  Surgeon: Irene Shipper, MD;  Location: [facility] ENDOSCOPY;  Service: Endoscopy;;  . Joan Mayans  12/25/2019   Procedure: SPHINCTEROTOMY;  Surgeon: Gatha Mayer, MD;  Location: [place];  Service: Gastroenterology;;  . TONSILLECTOMY    . TUBAL LIGATION    . UPPER GASTROINTESTINAL ENDOSCOPY      MEDICATIONS: . ALPRAZolam (XANAX) 0.25 MG tablet  . ARIPiprazole (ABILIFY) 2 MG tablet  . budesonide (ENTOCORT EC) 3 MG 24 hr capsule  . busPIRone (BUSPAR) 10 MG tablet  . Cholecalciferol (VITAMIN D) 50 MCG (2000  UT) tablet  . cholestyramine (QUESTRAN) 4 GM/DOSE powder  . denosumab (PROLIA) 60 MG/ML SOSY injection  . folic acid (FOLVITE) 1 MG tablet  . furosemide (LASIX) 20 MG tablet  . gabapentin (NEURONTIN) 300 MG capsule  . hyoscyamine (ANASPAZ) 0.125 MG TBDP disintergrating tablet  . morphine (MS CONTIN) 60 MG 12 hr tablet  . Multiple Vitamin (MULTIVITAMIN WITH MINERALS) TABS tablet  . nicotine (NICODERM CQ - DOSED IN MG/24 HOURS) 21 mg/24hr patch  . Oxycodone HCl 10 MG TABS  . OXYGEN  . pantoprazole (PROTONIX) 40 MG tablet  . potassium chloride (KLOR-CON) 10 MEQ tablet  . rosuvastatin (CRESTOR) 5 MG tablet  . sertraline (ZOLOFT) 100 MG tablet  . valACYclovir (VALTREX) 500 MG tablet  . vitamin B-12 (CYANOCOBALAMIN) 100 MCG tablet   No current facility-administered medications for this encounter.   . sodium chloride flush (NS) 0.9 % injection 3 mL    Maia Plan [facility] Pre-Surgical Testing [id number] 02/20/20  10:23 AM

## 2020-02-20 NOTE — Progress Notes (Signed)
Subjective:    Patient ID: Denise King, female    DOB: Sep 23, 1951, 69 y.o.   MRN: [id number]  HPI The patient is here for an acute visit.  She is having her GB removed tomorrow.  She pain and was hospitalized for this and is eager to have surgery.  B/l leg edema:  This is new in the past two weeks.  Dx with chf in December and was hospitalized with cholecystitis and CHF.  She had no edema when she was discharged.  She was discharged on lasix 20 mg daily and an extra dose prn  - she is taking the medication daily, but has not taken an extra dose. Over the past month her weight has increased from 109 to 116 since out of hospital.  She has had increased SOB.   She is taking care of her husband, who just got out of rehab.    She had a home screening for PAD and it showed moderate PAD.  She does have claudication symptoms.    Medications and allergies reviewed with patient and updated if appropriate.  Patient Active Problem List   Diagnosis Date Noted  . Lymphocytic colitis 02/16/2020  . Duodenal ulcer hemorrhagic   . Malnutrition of moderate degree 01/01/2020  . Acute respiratory failure with hypoxia and hypercapnia ([place]) 12/28/2019  . Bipolar disorder ([place]) 12/28/2019  . Alcohol abuse 12/28/2019  . Anxiety 12/28/2019  . Acute on chronic diastolic CHF (congestive heart failure) ([place]) 12/28/2019  . Abnormal MRI of abdomen   . Cholelithiasis   . Common bile duct stone 12/23/2019  . Acute cholecystitis 12/23/2019  . Polycythemia 09/11/2019  . Right knee pain 08/09/2019  . Quadriceps contusion 08/09/2019  . Abnormal cardiac CT angiography 05/11/2019  . Chest pain 04/11/2019  . Acute viral syndrome 04/11/2019  . Tobacco abuse 04/11/2019  . Hyperphosphatemia 04/11/2019  . SOB (shortness of breath) 04/07/2019  . Hip fracture ([place]) 10/01/2018  . Dysuria 05/20/2018  . Right leg swelling 05/20/2018  . CAP (community acquired pneumonia) 05/13/2018  . Tachycardia 05/13/2018    . Chest wall pain 05/13/2018  . Narcotic poisoning ([place]) 05/13/2018  . EtOH dependence ([place])   . Polypharmacy   . Esophagitis   . Preventative health care 12/22/2017  . Erythrocytosis 12/22/2017  . Depression 12/22/2017  . Hyperglycemia 12/22/2017  . Right sided sciatica 12/22/2017  . Nausea & vomiting 07/17/2017  . Cough 06/10/2017  . Diarrhea 04/22/2016  . Family history of colon cancer - brother and father 03/10/2016  . Insomnia 02/11/2016  . Generalized anxiety disorder 11/25/2015  . Urinary frequency 04/25/2015  . Nausea and vomiting in adult 11/08/2014  . Cigarette smoker 08/15/2013  . Otitis media 10/27/2012  . Abdominal pain 03/30/2012  . HEPATIC CYST 02/18/2010  . Chronic pain syndrome 02/08/2010  . Vitamin B12 deficiency 10/04/2009  . GERD 01/04/2009  . ABDOMINAL PAIN-EPIGASTRIC 01/04/2009  . DYSTHYMIA 08/10/2008  . Venous (peripheral) insufficiency 08/10/2008  . Irritable bowel syndrome 08/10/2008  . Back pain 08/10/2008  . CIGARETTE SMOKER 03/06/2008  . Hx of adenomatous polyp of colon 12/06/2007  . BRONCHITIS, RECURRENT 12/06/2007  . DIVERTICULOSIS OF COLON 12/06/2007  . OSTEOPENIA 12/06/2007  . CALCULUS, KIDNEY 10/07/2007  . VERTIGO 10/07/2007  . Headache(784.0) 10/07/2007    Current Outpatient Medications on File Prior to Visit  Medication Sig Dispense Refill  . ALPRAZolam (XANAX) 0.25 MG tablet Take 1 tablet (0.25 mg total) by mouth 3 (three) times daily as needed for anxiety. [place]  tablet 0  . ARIPiprazole (ABILIFY) 2 MG tablet Take 1 tablet (2 mg total) by mouth daily. 90 tablet 0  . budesonide (ENTOCORT EC) 3 MG 24 hr capsule Take 3 capsules (9 mg total) by mouth daily. 90 capsule 1  . busPIRone (BUSPAR) 10 MG tablet Take 1 tablet (10 mg total) by mouth 3 (three) times daily. 90 tablet 5  . Cholecalciferol (VITAMIN D) 50 MCG (2000 UT) tablet Take 2,000 Units by mouth daily after breakfast.     . cholestyramine (QUESTRAN) 4 GM/DOSE powder Take 1 packet  (4 g total) by mouth 2 (two) times daily with a meal. (Patient taking differently: Take 4 g by mouth 2 (two) times daily as needed ([name] (take with meals)). ) 378 g 12  . denosumab (PROLIA) 60 MG/ML SOSY injection Inject 60 mg into the skin every 6 (six) months.    . folic acid (FOLVITE) 1 MG tablet Take 1 tablet (1 mg total) by mouth daily. (Patient taking differently: Take 1 mg by mouth daily after breakfast. ) 90 tablet 3  . furosemide (LASIX) 20 MG tablet Take 1 tablet by mouth daily, take extra tablet by mouth daily as needed for edema or shortness of breath. (Patient taking differently: Take 20 mg by mouth daily. Take 1 tablet by mouth daily, take extra tablet by mouth daily as needed for edema or shortness of breath.) 45 tablet 11  . gabapentin (NEURONTIN) 300 MG capsule Take 300 mg by mouth 3 (three) times daily.    . hyoscyamine (ANASPAZ) 0.125 MG TBDP disintergrating tablet Place 0.125 mg under the tongue every 6 (six) hours as needed ([name] cramps).     . morphine (MS CONTIN) 60 MG 12 hr tablet Take 60 mg by mouth every 12 (twelve) hours.    . Multiple Vitamin (MULTIVITAMIN WITH MINERALS) TABS tablet Take 1 tablet by mouth daily. 30 tablet 0  . nicotine (NICODERM CQ - DOSED IN MG/24 HOURS) 21 mg/24hr patch Place 1 patch (21 mg total) onto the skin daily. 28 patch 0  . Oxycodone HCl 10 MG TABS Take 10 mg by mouth 4 (four) times daily.     . OXYGEN Inhale 2 L/min into the lungs as needed.    . pantoprazole (PROTONIX) 40 MG tablet Take 1 tablet (40 mg total) by mouth 2 (two) times daily before a meal. 60 tablet 1  . potassium chloride (KLOR-CON) 10 MEQ tablet TAKE 1 TABLET ONCE DAILY. (Patient taking differently: Take 10 mEq by mouth daily. ) 30 tablet 5  . rosuvastatin (CRESTOR) 5 MG tablet Take 1 tablet (5 mg total) by mouth every Monday, Wednesday, and Friday at 6 PM. 36 tablet 3  . sertraline (ZOLOFT) 100 MG tablet Take 2 tablets (200 mg total) by mouth at bedtime. 180 tablet 0  .  valACYclovir (VALTREX) 500 MG tablet Take 500 mg by mouth 2 (two) times daily as needed (flare ups).    . vitamin B-12 (CYANOCOBALAMIN) 100 MCG tablet Take 1 tablet (100 mcg total) by mouth daily. (Patient taking differently: Take 100 mcg by mouth daily after breakfast. ) 90 tablet 3   Current Facility-Administered Medications on File Prior to Visit  Medication Dose Route Frequency Provider Last Rate Last Admin  . sodium chloride flush (NS) 0.9 % injection 3 mL  3 mL Intravenous Q12H Josue Hector, MD        Past Medical History:  Diagnosis Date  . Acute cystitis   . Acute respiratory failure ([place])   .  Allergy    as a child grew out of them  . Anemia    pernicious anemia  . Anxiety   . Arthritis   . Back pain   . Blood transfusion   . CAP (community acquired pneumonia)   . CHF (congestive heart failure) ([place])   . Chronic pain syndrome   . Common bile duct stone   . Depression   . Diverticulosis of colon   . Duodenal ulcer hemorrhagic-after biliary sphincterotomy   . Dysthymia   . Esophagitis   . EtOH dependence ([place])   . Gastritis   . GERD (gastroesophageal reflux disease)   . H/O chest pain Dec. 2013   no work up done  . Hx of colonic polyps   . Hypertension   . Irritable bowel syndrome   . Lymphocytic colitis 02/16/2020  . Osteopenia   . Osteoporosis   . Pneumonia 2019  . Polypharmacy   . Reflux esophagitis   . Right sided sciatica 12/22/2017  . Tobacco use disorder   . Unspecified chronic bronchitis ([place])   . Vertigo   . Vitamin B12 deficiency   . Wears glasses     Past Surgical History:  Procedure Laterality Date  . APPENDECTOMY    . BACK SURGERY  10-09   Dr. Patrice Paradise  . BIOPSY  02/14/2020   Procedure: BIOPSY;  Surgeon: Gatha Mayer, MD;  Location: [facility];  Service: Endoscopy;;  . CARPAL TUNNEL RELEASE Right 08/14/2014   Procedure: RIGHT CARPAL TUNNEL RELEASE AND INJECT LEFT THUMB;  Surgeon: Daryll Brod, MD;  Location: [facility];   Service: Orthopedics;  Laterality: Right;  . COLONOSCOPY    . COLONOSCOPY WITH PROPOFOL N/A 02/14/2020   Procedure: COLONOSCOPY WITH PROPOFOL;  Surgeon: Gatha Mayer, MD;  Location: [facility];  Service: Endoscopy;  Laterality: N/A;  . ENDOSCOPIC RETROGRADE CHOLANGIOPANCREATOGRAPHY (ERCP) WITH PROPOFOL N/A 12/25/2019   Procedure: ENDOSCOPIC RETROGRADE CHOLANGIOPANCREATOGRAPHY (ERCP) WITH PROPOFOL;  Surgeon: Gatha Mayer, MD;  Location: [facility];  Service: Gastroenterology;  Laterality: N/A;  . ESOPHAGOGASTRODUODENOSCOPY (EGD) WITH PROPOFOL N/A 01/06/2020   Procedure: ESOPHAGOGASTRODUODENOSCOPY (EGD) WITH PROPOFOL;  Surgeon: Irene Shipper, MD;  Location[facility] ENDOSCOPY;  Service: Endoscopy;  Laterality: N/A;  . HOT HEMOSTASIS N/A 01/06/2020   Procedure: HOT HEMOSTASIS (ARGON PLASMA COAGULATION/BICAP);  Surgeon: Irene Shipper, MD;  Location[facility];  Service: Endoscopy;  Laterality: N/A;  . INTRAMEDULLARY (IM) NAIL INTERTROCHANTERIC Right 10/01/2018   Procedure: INTRAMEDULLARY (IM) NAIL INTERTROCHANTRIC;  Surgeon: Thornton Park, MD;  Location: [facility] ORS;  Service: Orthopedics;  Laterality: Right;  . LAPAROSCOPY N/A 02/21/2013   Procedure: LAPAROSCOPY OPERATIVE;  Surgeon: Margarette Asal, MD;  Location: [facility];  Service: Gynecology;  Laterality: N/A;  REQUESTING 5MM SCOPE WITH CAMERA  . LEFT HEART CATH AND CORONARY ANGIOGRAPHY N/A 05/11/2019   Procedure: LEFT HEART CATH AND CORONARY ANGIOGRAPHY;  Surgeon: Sherren Mocha, MD;  Location: [facility] CV LAB;  Service: Cardiovascular;  Laterality: N/A;  . REMOVAL OF STONES  12/25/2019   Procedure: Karlyn Agee;  Surgeon: Gatha Mayer, MD;  Location: [facility] ENDOSCOPY;  Service: Gastroenterology;;  . Clide Deutscher  01/06/2020   Procedure: Clide Deutscher;  Surgeon: Irene Shipper, MD;  Location: [facility] ENDOSCOPY;  Service: Endoscopy;;  . Joan Mayans  12/25/2019   Procedure: SPHINCTEROTOMY;  Surgeon: Gatha Mayer, MD;  Location: [facility];   Service: Gastroenterology;;  . TONSILLECTOMY    . TUBAL LIGATION    . UPPER GASTROINTESTINAL ENDOSCOPY  Social History   Socioeconomic History  . Marital status: Married    Spouse name: Not on file  . Number of children: 4  . Years of education: 24  . Highest education level: Not on file  Occupational History  . Occupation: retired    Comment: disablility  Tobacco Use  . Smoking status: Current Every Day Smoker    Packs/day: 1.00    Years: 30.00    Pack years: 30.00    Types: Cigarettes  . Smokeless tobacco: Never Used  Substance and Sexual Activity  . Alcohol use: Yes    Alcohol/week: 3.0 standard drinks    Types: 3 Standard drinks or equivalent per week    Comment: occasional  . Drug use: No  . Sexual activity: Yes  Other Topics Concern  . Not on file  Social History Narrative   Married x 38 yrs. Has BS degree in Horticulture from [employer]. Currently resides in a house with her husband. 3 cats.  Fun: used to like to do a lot of things.    Denies religious beliefs that would effect health care.    lives with husband [name]; quit smoking- October 6th, 2019. ocasional alcohol. [employer] [2009] on disability sec to back issues.    Social Determinants of Health   Financial Resource Strain: Low Risk   . Difficulty of Paying Living Expenses: Not very hard  Food Insecurity: No Food Insecurity  . Worried About Charity fundraiser in the Last Year: Never true  . Ran Out of Food in the Last Year: Never true  Transportation Needs: No Transportation Needs  . Lack of Transportation (Medical): No  . Lack of Transportation (Non-Medical): No  Physical Activity: Inactive  . Days of Exercise per Week: 0 days  . Minutes of Exercise per Session: 0 min  Stress: Stress Concern Present  . Feeling of Stress : Rather much  Social Connections: Somewhat Isolated  . Frequency of Communication with Friends and Family: More than three times a week  . Frequency of Social  Gatherings with Friends and Family: Once a week  . Attends Religious Services: Never  . Active Member of Clubs or Organizations: No  . Attends Archivist Meetings: Never  . Marital Status: Married    Family History  Problem Relation Age of Onset  . Colon cancer Father   . Prostate cancer Father   . Heart disease Mother        prev MVR, also has DJD  . Colon cancer Brother   . Skin cancer Sister   . Alcohol abuse Daughter   . Bipolar disorder Daughter   . Drug abuse Daughter   . Esophageal cancer Neg Hx   . Rectal cancer Neg Hx   . Stomach cancer Neg Hx     Review of Systems  Constitutional: Negative for chills and fever.  Respiratory: Positive for shortness of breath (sometimes). Negative for cough and wheezing.   Cardiovascular: Positive for chest pain (a little tightness with swelling) and leg swelling. Negative for palpitations (heart racing at times).  Skin: Negative for color change.  Neurological: Negative for light-headedness and headaches.       Objective:   Vitals:   02/21/20 1506  BP: (!) 146/80  Pulse: 78  Resp: 16  Temp: 98.1 F (36.7 C)  SpO2: 94%   BP Readings from Last 3 Encounters:  02/21/20 (!) 146/80  02/19/20 117/60  02/14/20 111/71   Wt Readings from Last 3 Encounters:  02/21/20  117 lb (53.1 kg)  02/19/20 116 lb 4.8 oz (52.8 kg)  02/15/20 110 lb (49.9 kg)   Body mass index is 22.11 kg/m.   Physical Exam    Constitutional: Appears well-developed and well-nourished. No distress.  Head: Normocephalic and atraumatic.  Neck: Neck supple. No tracheal deviation present. No thyromegaly present.  No cervical lymphadenopathy Cardiovascular: Normal rate, regular rhythm and normal heart sounds.  No murmur heard. No carotid bruit .  1 + b.l LE edema Pulmonary/Chest: Effort normal and breath sounds normal. No respiratory distress. No has no wheezes. Minimal bibasilar rales.  Skin: Skin is warm and dry. Not diaphoretic.  Psychiatric:  Normal mood and affect. Behavior is normal.       Assessment & Plan:    See Problem List for Assessment and Plan of chronic medical problems.    This visit occurred during the SARS-CoV-2 public health emergency.  Safety protocols were in place, including screening questions prior to the visit, additional usage of staff PPE, and extensive cleaning of exam room while observing appropriate contact time as indicated for disinfecting solutions.

## 2020-02-20 NOTE — Telephone Encounter (Addendum)
Contacted patient and she stated that she is feeling fine except for pain in her back.

## 2020-02-20 NOTE — Telephone Encounter (Signed)
Pt called and stated that she needed to be seen for edema. Pt has been scheduled for a visit with Burns tomorrow.

## 2020-02-20 NOTE — Telephone Encounter (Signed)
Not able to schedule with Burns due to session limits.  Spoke to PCP and he gave verbal orders for: 40 meq daily for 3 days then go back to 10 meq daily. Repeat bmet in 1 week.   Pt informed of same. She has been scheduled for follow up labs on 02/27/2020

## 2020-02-20 NOTE — Anesthesia Preprocedure Evaluation (Addendum)
Anesthesia Evaluation  Patient identified by MRN, date of birth, ID band Patient awake    Reviewed: Allergy & Precautions, NPO status , Patient's Chart, lab work & pertinent test results  History of Anesthesia Complications Negative for: history of anesthetic complications  Airway Mallampati: I  TM Distance: >3 FB Neck ROM: Full    Dental   Pulmonary Current Smoker,    Pulmonary exam normal        Cardiovascular hypertension, +CHF (diastolic)  Normal cardiovascular exam   '21 TTE - EF 60 to 65%. Mildly increased left ventricular hypertrophy. Elevated left atrial pressure. Grade II diastolic dysfunction (pseudonormalization). LA was mildly dilated. Mild MR and TR.  '20 Cath - Mid RCA lesion is 50% stenosed. Prox Cx to Mid Cx lesion is 30% stenosed. Prox LAD to Mid LAD lesion is 50% stenosed. LV end diastolic pressure is low.    Neuro/Psych  Headaches, PSYCHIATRIC DISORDERS Anxiety Depression Bipolar Disorder  Vertigo   Neuromuscular disease (sciatica)    GI/Hepatic PUD, GERD  ,(+)     substance abuse  alcohol use,  [name]    Endo/Other   Hypokalemia   Renal/GU negative Renal ROS     Musculoskeletal  (+) Arthritis , narcotic dependent Chronic pain    Abdominal   Peds  Hematology negative hematology ROS (+)   Anesthesia Other Findings Covid neg 02/19/20   Reproductive/Obstetrics                           Anesthesia Physical Anesthesia Plan  ASA: III  Anesthesia Plan: General   Post-op Pain Management:    Induction: Intravenous  PONV Risk Score and Plan: 4 or greater and Treatment may vary due to age or medical condition, Ondansetron, Dexamethasone, Midazolam and Scopolamine patch - Pre-op  Airway Management Planned: Oral ETT  Additional Equipment: None  Intra-op Plan:   Post-operative Plan: Extubation in OR  Informed Consent:   Plan Discussed with: CRNA and  Surgeon  Anesthesia Plan Comments: (Will utilize ketamine intraop given chronic narcotic usage )      Anesthesia Quick Evaluation

## 2020-02-21 ENCOUNTER — Encounter: Payer: Self-pay | Admitting: Internal Medicine

## 2020-02-21 ENCOUNTER — Ambulatory Visit (INDEPENDENT_AMBULATORY_CARE_PROVIDER_SITE_OTHER): Payer: Medicare HMO | Admitting: Internal Medicine

## 2020-02-21 VITALS — BP 146/80 | HR 78 | Temp 98.1°F | Resp 16 | Ht 61.0 in | Wt 117.0 lb

## 2020-02-21 DIAGNOSIS — I739 Peripheral vascular disease, unspecified: Secondary | ICD-10-CM

## 2020-02-21 DIAGNOSIS — I5033 Acute on chronic diastolic (congestive) heart failure: Secondary | ICD-10-CM

## 2020-02-21 NOTE — Patient Instructions (Addendum)
Take 2 furosemide this afternoon and 2 furosemide daily in the morning from now on until you follow up with Dr Jenny Reichmann next week.    Continue the increase dose of potassium.    A referral was ordered for vascular surgery.     Please call with any questions.

## 2020-02-21 NOTE — Assessment & Plan Note (Signed)
Acute on chronic - not compensated Increased SOB, leg edema and weight slowly increasing Taking lasix 20 mg daily Increase lasix 40 mg daily Taking 40 mg tonight of lasix Continue KCL 40 meq daily Needs to f/u next week due to low potassium and chf Call with questions

## 2020-02-22 ENCOUNTER — Ambulatory Visit (HOSPITAL_COMMUNITY): Payer: Medicare HMO | Admitting: Physician Assistant

## 2020-02-22 ENCOUNTER — Observation Stay (HOSPITAL_COMMUNITY)
Admission: RE | Admit: 2020-02-22 | Discharge: 2020-02-23 | Disposition: A | Discharge status: Home / Self Care | Payer: Medicare HMO | Source: Other Acute Inpatient Hospital | Attending: General Surgery | Admitting: General Surgery

## 2020-02-22 ENCOUNTER — Encounter (HOSPITAL_COMMUNITY)
Admission: RE | Disposition: A | Discharge status: Home / Self Care | Payer: Self-pay | Source: Other Acute Inpatient Hospital | Attending: General Surgery

## 2020-02-22 ENCOUNTER — Encounter (HOSPITAL_COMMUNITY): Payer: Self-pay | Admitting: General Surgery

## 2020-02-22 ENCOUNTER — Other Ambulatory Visit: Payer: Self-pay

## 2020-02-22 ENCOUNTER — Ambulatory Visit (HOSPITAL_COMMUNITY): Payer: Medicare HMO

## 2020-02-22 DIAGNOSIS — I110 Hypertensive heart disease with heart failure: Secondary | ICD-10-CM | POA: Insufficient documentation

## 2020-02-22 DIAGNOSIS — F329 Major depressive disorder, single episode, unspecified: Secondary | ICD-10-CM | POA: Insufficient documentation

## 2020-02-22 DIAGNOSIS — Z86010 Personal history of colonic polyps: Secondary | ICD-10-CM | POA: Diagnosis not present

## 2020-02-22 DIAGNOSIS — K589 Irritable bowel syndrome without diarrhea: Secondary | ICD-10-CM | POA: Diagnosis not present

## 2020-02-22 DIAGNOSIS — M1990 Unspecified osteoarthritis, unspecified site: Secondary | ICD-10-CM | POA: Diagnosis not present

## 2020-02-22 DIAGNOSIS — F17210 Nicotine dependence, cigarettes, uncomplicated: Secondary | ICD-10-CM | POA: Diagnosis not present

## 2020-02-22 DIAGNOSIS — K8050 Calculus of bile duct without cholangitis or cholecystitis without obstruction: Secondary | ICD-10-CM | POA: Diagnosis not present

## 2020-02-22 DIAGNOSIS — K5730 Diverticulosis of large intestine without perforation or abscess without bleeding: Secondary | ICD-10-CM | POA: Insufficient documentation

## 2020-02-22 DIAGNOSIS — Z20822 Contact with and (suspected) exposure to covid-19: Secondary | ICD-10-CM | POA: Diagnosis not present

## 2020-02-22 DIAGNOSIS — K8010 Calculus of gallbladder with chronic cholecystitis without obstruction: Secondary | ICD-10-CM | POA: Diagnosis not present

## 2020-02-22 DIAGNOSIS — Z7952 Long term (current) use of systemic steroids: Secondary | ICD-10-CM | POA: Insufficient documentation

## 2020-02-22 DIAGNOSIS — I5033 Acute on chronic diastolic (congestive) heart failure: Secondary | ICD-10-CM

## 2020-02-22 DIAGNOSIS — K219 Gastro-esophageal reflux disease without esophagitis: Secondary | ICD-10-CM | POA: Diagnosis not present

## 2020-02-22 DIAGNOSIS — Z419 Encounter for procedure for purposes other than remedying health state, unspecified: Secondary | ICD-10-CM

## 2020-02-22 DIAGNOSIS — K915 Postcholecystectomy syndrome: Secondary | ICD-10-CM | POA: Diagnosis not present

## 2020-02-22 DIAGNOSIS — Z79899 Other long term (current) drug therapy: Secondary | ICD-10-CM | POA: Diagnosis not present

## 2020-02-22 DIAGNOSIS — G894 Chronic pain syndrome: Secondary | ICD-10-CM | POA: Diagnosis not present

## 2020-02-22 DIAGNOSIS — K8060 Calculus of gallbladder and bile duct with cholecystitis, unspecified, without obstruction: Principal | ICD-10-CM | POA: Insufficient documentation

## 2020-02-22 DIAGNOSIS — E876 Hypokalemia: Secondary | ICD-10-CM | POA: Diagnosis not present

## 2020-02-22 DIAGNOSIS — M8580 Other specified disorders of bone density and structure, unspecified site: Secondary | ICD-10-CM | POA: Insufficient documentation

## 2020-02-22 DIAGNOSIS — K8070 Calculus of gallbladder and bile duct without cholecystitis without obstruction: Secondary | ICD-10-CM

## 2020-02-22 DIAGNOSIS — F419 Anxiety disorder, unspecified: Secondary | ICD-10-CM | POA: Insufficient documentation

## 2020-02-22 DIAGNOSIS — R69 Illness, unspecified: Secondary | ICD-10-CM | POA: Diagnosis not present

## 2020-02-22 DIAGNOSIS — I509 Heart failure, unspecified: Secondary | ICD-10-CM | POA: Diagnosis not present

## 2020-02-22 HISTORY — PX: CHOLECYSTECTOMY: SHX55

## 2020-02-22 LAB — BASIC METABOLIC PANEL
Anion gap: 11 (ref 5–15)
Anion gap: 6 (ref 5–15)
BUN: <5 mg/dL — ABNORMAL LOW (ref 8–23)
BUN: <5 mg/dL — ABNORMAL LOW (ref 8–23)
CO2: 38 mmol/L — ABNORMAL HIGH (ref 22–32)
CO2: 36 mmol/L — ABNORMAL HIGH (ref 22–32)
Calcium: 7.5 mg/dL — ABNORMAL LOW (ref 8.9–10.3)
Calcium: 6.8 mg/dL — ABNORMAL LOW (ref 8.9–10.3)
Chloride: 93 mmol/L — ABNORMAL LOW (ref 98–111)
Chloride: 97 mmol/L — ABNORMAL LOW (ref 98–111)
Creatinine, Ser: 0.71 mg/dL (ref 0.44–1.00)
Creatinine, Ser: 0.61 mg/dL (ref 0.44–1.00)
GFR calc Af Amer: >60 mL/min (ref >60)
GFR calc Af Amer: >60 mL/min (ref >60)
GFR calc non Af Amer: >60 mL/min (ref >60)
GFR calc non Af Amer: >60 mL/min (ref >60)
Glucose, Bld: 62 mg/dL — ABNORMAL LOW (ref 70–99)
Glucose, Bld: 87 mg/dL (ref 70–99)
Potassium: 2.6 mmol/L — CL (ref 3.5–5.1)
Potassium: 3.3 mmol/L — ABNORMAL LOW (ref 3.5–5.1)
Sodium: 142 mmol/L (ref 135–145)
Sodium: 139 mmol/L (ref 135–145)

## 2020-02-22 LAB — CBC
HCT: 43.6 % (ref 36.0–46.0)
Hemoglobin: 13.9 g/dL (ref 12.0–15.0)
MCH: 30.9 pg (ref 26.0–34.0)
MCHC: 31.9 g/dL (ref 30.0–36.0)
MCV: 96.9 fL (ref 80.0–100.0)
Platelets: 265 10*3/uL (ref 150–400)
RBC: 4.5 MIL/uL (ref 3.87–5.11)
RDW: 13.1 % (ref 11.5–15.5)
WBC: 13 10*3/uL — ABNORMAL HIGH (ref 4.0–10.5)
nRBC: 0 % (ref 0.0–0.2)

## 2020-02-22 LAB — CREATININE, SERUM
Creatinine, Ser: 0.7 mg/dL (ref 0.44–1.00)
GFR calc Af Amer: >60 mL/min (ref >60)
GFR calc non Af Amer: >60 mL/min (ref >60)

## 2020-02-22 SURGERY — LAPAROSCOPIC CHOLECYSTECTOMY WITH INTRAOPERATIVE CHOLANGIOGRAM
Anesthesia: General | Site: Abdomen

## 2020-02-22 MED ORDER — OXYCODONE HCL 5 MG PO TABS
5.0000 mg | ORAL_TABLET | ORAL | Status: DC | PRN
  Administered 2020-02-22 – 2020-02-23 (×4): 10 mg via ORAL
  Filled 2020-02-22 (×4): qty 2

## 2020-02-22 MED ORDER — METOPROLOL TARTRATE 5 MG/5ML IV SOLN: 5.0000 mg | Freq: Four times a day (QID) | INTRAVENOUS | Status: DC | PRN

## 2020-02-22 MED ORDER — ACETAMINOPHEN 500 MG PO TABS
1000.0000 mg | ORAL_TABLET | ORAL | Status: AC
  Administered 2020-02-22: 08:00:00 1000 mg via ORAL
  Filled 2020-02-22: qty 2

## 2020-02-22 MED ORDER — IOHEXOL 300 MG/ML  SOLN
INTRAMUSCULAR | Status: DC | PRN
  Administered 2020-02-22: 17.5 mL

## 2020-02-22 MED ORDER — KETAMINE HCL 10 MG/ML IJ SOLN
INTRAMUSCULAR | Status: AC
  Filled 2020-02-22: qty 1

## 2020-02-22 MED ORDER — FENTANYL CITRATE (PF) 100 MCG/2ML IJ SOLN
25.0000 ug | INTRAMUSCULAR | Status: DC | PRN
  Administered 2020-02-22 (×3): 50 ug via INTRAVENOUS

## 2020-02-22 MED ORDER — POTASSIUM CHLORIDE 10 MEQ/100ML IV SOLN
10.0000 meq | INTRAVENOUS | Status: AC
  Administered 2020-02-22 (×4): 10 meq via INTRAVENOUS
  Filled 2020-02-22 (×4): qty 100

## 2020-02-22 MED ORDER — IBUPROFEN 800 MG PO TABS: 800.0000 mg | ORAL_TABLET | Freq: Three times a day (TID) | ORAL | 0 refills | Status: DC | PRN

## 2020-02-22 MED ORDER — FENTANYL CITRATE (PF) 250 MCG/5ML IJ SOLN
INTRAMUSCULAR | Status: AC
  Filled 2020-02-22: qty 5

## 2020-02-22 MED ORDER — ROCURONIUM BROMIDE 10 MG/ML (PF) SYRINGE
PREFILLED_SYRINGE | INTRAVENOUS | Status: AC
  Filled 2020-02-22: qty 10

## 2020-02-22 MED ORDER — BUPIVACAINE HCL (PF) 0.25 % IJ SOLN
INTRAMUSCULAR | Status: DC | PRN
  Administered 2020-02-22: 30 mL

## 2020-02-22 MED ORDER — ONDANSETRON HCL 4 MG/2ML IJ SOLN
INTRAMUSCULAR | Status: DC | PRN
  Administered 2020-02-22: 4 mg via INTRAVENOUS

## 2020-02-22 MED ORDER — ACETAMINOPHEN 500 MG PO TABS: 1000.0000 mg | ORAL_TABLET | Freq: Once | ORAL | Status: DC

## 2020-02-22 MED ORDER — KETOROLAC TROMETHAMINE 15 MG/ML IJ SOLN
15.0000 mg | Freq: Four times a day (QID) | INTRAMUSCULAR | Status: AC
  Administered 2020-02-22: 15 mg via INTRAVENOUS
  Filled 2020-02-22: qty 1

## 2020-02-22 MED ORDER — BUPIVACAINE HCL 0.25 % IJ SOLN
INTRAMUSCULAR | Status: AC
  Filled 2020-02-22: qty 1

## 2020-02-22 MED ORDER — FENTANYL CITRATE (PF) 100 MCG/2ML IJ SOLN
INTRAMUSCULAR | Status: AC
  Filled 2020-02-22: qty 2

## 2020-02-22 MED ORDER — DIPHENHYDRAMINE HCL 50 MG/ML IJ SOLN: 12.5000 mg | Freq: Four times a day (QID) | INTRAMUSCULAR | Status: DC | PRN

## 2020-02-22 MED ORDER — DEXAMETHASONE SODIUM PHOSPHATE 10 MG/ML IJ SOLN
INTRAMUSCULAR | Status: AC
  Filled 2020-02-22: qty 1

## 2020-02-22 MED ORDER — ONDANSETRON HCL 4 MG/2ML IJ SOLN: 4.0000 mg | Freq: Once | INTRAMUSCULAR | Status: DC | PRN

## 2020-02-22 MED ORDER — DIPHENHYDRAMINE HCL 12.5 MG/5ML PO ELIX: 12.5000 mg | ORAL_SOLUTION | Freq: Four times a day (QID) | ORAL | Status: DC | PRN

## 2020-02-22 MED ORDER — ONDANSETRON HCL 4 MG/2ML IJ SOLN: 4.0000 mg | Freq: Four times a day (QID) | INTRAMUSCULAR | Status: DC | PRN

## 2020-02-22 MED ORDER — MIDAZOLAM HCL 2 MG/2ML IJ SOLN
INTRAMUSCULAR | Status: AC
  Filled 2020-02-22: qty 2

## 2020-02-22 MED ORDER — PROPOFOL 10 MG/ML IV BOLUS
INTRAVENOUS | Status: DC | PRN
  Administered 2020-02-22: 130 mg via INTRAVENOUS

## 2020-02-22 MED ORDER — ROCURONIUM BROMIDE 10 MG/ML (PF) SYRINGE
PREFILLED_SYRINGE | INTRAVENOUS | Status: DC | PRN
  Administered 2020-02-22: 80 mg via INTRAVENOUS

## 2020-02-22 MED ORDER — LACTATED RINGERS IV SOLN: INTRAVENOUS | Status: DC

## 2020-02-22 MED ORDER — FENTANYL CITRATE (PF) 250 MCG/5ML IJ SOLN
INTRAMUSCULAR | Status: DC | PRN
  Administered 2020-02-22: 50 ug via INTRAVENOUS
  Administered 2020-02-22: 100 ug via INTRAVENOUS

## 2020-02-22 MED ORDER — MORPHINE SULFATE (PF) 4 MG/ML IV SOLN
INTRAVENOUS | Status: AC
  Filled 2020-02-22: qty 1

## 2020-02-22 MED ORDER — OXYCODONE HCL 5 MG PO TABS
ORAL_TABLET | ORAL | Status: AC
  Filled 2020-02-22: qty 2

## 2020-02-22 MED ORDER — EPHEDRINE 5 MG/ML INJ
INTRAVENOUS | Status: AC
  Filled 2020-02-22: qty 10

## 2020-02-22 MED ORDER — OXYCODONE HCL 5 MG PO TABS
10.0000 mg | ORAL_TABLET | Freq: Once | ORAL | Status: AC | PRN
  Administered 2020-02-22: 10 mg via ORAL

## 2020-02-22 MED ORDER — SODIUM CHLORIDE 0.9 % IV SOLN: INTRAVENOUS | Status: DC

## 2020-02-22 MED ORDER — GABAPENTIN 300 MG PO CAPS: 300.0000 mg | ORAL_CAPSULE | ORAL | Status: DC

## 2020-02-22 MED ORDER — ENSURE PRE-SURGERY PO LIQD
296.0000 mL | Freq: Once | ORAL | Status: DC
  Filled 2020-02-22: qty 296

## 2020-02-22 MED ORDER — 0.9 % SODIUM CHLORIDE (POUR BTL) OPTIME
TOPICAL | Status: DC | PRN
  Administered 2020-02-22: 1000 mL

## 2020-02-22 MED ORDER — ENOXAPARIN SODIUM 40 MG/0.4ML ~~LOC~~ SOLN
40.0000 mg | SUBCUTANEOUS | Status: DC
  Administered 2020-02-23: 40 mg via SUBCUTANEOUS
  Filled 2020-02-22: qty 0.4

## 2020-02-22 MED ORDER — PROPOFOL 10 MG/ML IV BOLUS
INTRAVENOUS | Status: AC
  Filled 2020-02-22: qty 20

## 2020-02-22 MED ORDER — ONDANSETRON HCL 4 MG/2ML IJ SOLN
INTRAMUSCULAR | Status: AC
  Filled 2020-02-22: qty 2

## 2020-02-22 MED ORDER — OXYCODONE HCL 5 MG/5ML PO SOLN: 5.0000 mg | Freq: Once | ORAL | Status: AC | PRN

## 2020-02-22 MED ORDER — KCL IN DEXTROSE-NACL 20-5-0.45 MEQ/L-%-% IV SOLN
INTRAVENOUS | Status: DC
  Filled 2020-02-22 (×2): qty 1000

## 2020-02-22 MED ORDER — EPHEDRINE SULFATE-NACL 50-0.9 MG/10ML-% IV SOSY
PREFILLED_SYRINGE | INTRAVENOUS | Status: DC | PRN
  Administered 2020-02-22: 10 mg via INTRAVENOUS

## 2020-02-22 MED ORDER — LIDOCAINE 20MG/ML (2%) 15 ML SYRINGE OPTIME
INTRAMUSCULAR | Status: DC | PRN
  Administered 2020-02-22: 1.5 mg/kg/h via INTRAVENOUS

## 2020-02-22 MED ORDER — OXYCODONE HCL 5 MG PO TABS: 5.0000 mg | ORAL_TABLET | Freq: Four times a day (QID) | ORAL | 0 refills | Status: DC | PRN

## 2020-02-22 MED ORDER — MIDAZOLAM HCL 5 MG/5ML IJ SOLN
INTRAMUSCULAR | Status: DC | PRN
  Administered 2020-02-22 (×2): 1 mg via INTRAVENOUS

## 2020-02-22 MED ORDER — ONDANSETRON 4 MG PO TBDP: 4.0000 mg | ORAL_TABLET | Freq: Four times a day (QID) | ORAL | Status: DC | PRN

## 2020-02-22 MED ORDER — MORPHINE SULFATE (PF) 4 MG/ML IV SOLN
4.0000 mg | INTRAVENOUS | Status: DC | PRN
  Administered 2020-02-22 (×2): 4 mg via INTRAVENOUS
  Filled 2020-02-22 (×2): qty 1

## 2020-02-22 MED ORDER — LACTATED RINGERS IR SOLN
Status: DC | PRN
  Administered 2020-02-22: 1000 mL

## 2020-02-22 MED ORDER — SUGAMMADEX SODIUM 200 MG/2ML IV SOLN
INTRAVENOUS | Status: DC | PRN
  Administered 2020-02-22: 200 mg via INTRAVENOUS

## 2020-02-22 MED ORDER — KETOROLAC TROMETHAMINE 15 MG/ML IJ SOLN
15.0000 mg | Freq: Four times a day (QID) | INTRAMUSCULAR | Status: DC | PRN
  Administered 2020-02-23: 09:00:00 15 mg via INTRAVENOUS
  Filled 2020-02-22: qty 1

## 2020-02-22 MED ORDER — CHLORHEXIDINE GLUCONATE CLOTH 2 % EX PADS: 6.0000 | MEDICATED_PAD | Freq: Once | CUTANEOUS | Status: DC

## 2020-02-22 MED ORDER — LIDOCAINE 2% (20 MG/ML) 5 ML SYRINGE
INTRAMUSCULAR | Status: DC | PRN
  Administered 2020-02-22: 100 mg via INTRAVENOUS

## 2020-02-22 MED ORDER — DEXAMETHASONE SODIUM PHOSPHATE 10 MG/ML IJ SOLN
INTRAMUSCULAR | Status: DC | PRN
  Administered 2020-02-22: 10 mg via INTRAVENOUS

## 2020-02-22 MED ORDER — CLINDAMYCIN PHOSPHATE 900 MG/50ML IV SOLN
900.0000 mg | INTRAVENOUS | Status: AC
  Administered 2020-02-22: 900 mg via INTRAVENOUS
  Filled 2020-02-22: qty 50

## 2020-02-22 SURGICAL SUPPLY — 49 items
ADH SKN CLS APL DERMABOND .7 (GAUZE/BANDAGES/DRESSINGS) ×1
APL PRP STRL LF DISP 70% ISPRP (MISCELLANEOUS) ×1
APL SKNCLS STERI-STRIP NONHPOA (GAUZE/BANDAGES/DRESSINGS) ×1
APPLIER CLIP ROT 10 11.4 M/L (STAPLE) ×2
APR CLP MED LRG 11.4X10 (STAPLE) ×1
BAG SPEC RTRVL 10 TROC 200 (ENDOMECHANICALS) ×1
BENZOIN TINCTURE PRP APPL 2/3 (GAUZE/BANDAGES/DRESSINGS) ×2 IMPLANT
BNDG ADH 1X3 SHEER STRL LF (GAUZE/BANDAGES/DRESSINGS) ×8 IMPLANT
BNDG ADH THN 3X1 STRL LF (GAUZE/BANDAGES/DRESSINGS) ×4
CABLE HIGH FREQUENCY MONO STRZ (ELECTRODE) ×2 IMPLANT
CATH CHOLANG 76X19 KUMAR (CATHETERS) ×2 IMPLANT
CHLORAPREP W/TINT 26 (MISCELLANEOUS) ×2 IMPLANT
CLIP APPLIE ROT 10 11.4 M/L (STAPLE) IMPLANT
CLIP VESOLOCK LG 6/CT PURPLE (CLIP) ×2 IMPLANT
CLIP VESOLOCK MED LG 6/CT (CLIP) IMPLANT
COVER MAYO STAND STRL (DRAPES) ×2 IMPLANT
COVER SURGICAL LIGHT HANDLE (MISCELLANEOUS) ×2 IMPLANT
COVER WAND RF STERILE (DRAPES) IMPLANT
DECANTER SPIKE VIAL GLASS SM (MISCELLANEOUS) ×2 IMPLANT
DERMABOND ADVANCED (GAUZE/BANDAGES/DRESSINGS) ×1
DERMABOND ADVANCED .7 DNX12 (GAUZE/BANDAGES/DRESSINGS) IMPLANT
DRAIN CHANNEL 19F RND (DRAIN) IMPLANT
DRAPE C-ARM 42X120 X-RAY (DRAPES) ×1 IMPLANT
EVACUATOR SILICONE 100CC (DRAIN) IMPLANT
GLOVE BIOGEL PI IND STRL 7.0 (GLOVE) ×1 IMPLANT
GLOVE BIOGEL PI INDICATOR 7.0 (GLOVE) ×1
GLOVE SURG SS PI 7.0 STRL IVOR (GLOVE) ×2 IMPLANT
GOWN STRL REUS W/TWL LRG LVL3 (GOWN DISPOSABLE) ×2 IMPLANT
GOWN STRL REUS W/TWL XL LVL3 (GOWN DISPOSABLE) ×4 IMPLANT
GRASPER SUT TROCAR 14GX15 (MISCELLANEOUS) IMPLANT
KIT BASIN OR (CUSTOM PROCEDURE TRAY) ×2 IMPLANT
KIT TURNOVER KIT A (KITS) IMPLANT
PENCIL SMOKE EVACUATOR (MISCELLANEOUS) IMPLANT
POUCH RETRIEVAL ECOSAC 10 (ENDOMECHANICALS) ×1 IMPLANT
POUCH RETRIEVAL ECOSAC 10MM (ENDOMECHANICALS) ×1
SCISSORS LAP 5X35 DISP (ENDOMECHANICALS) ×2 IMPLANT
SET IRRIG TUBING LAPAROSCOPIC (IRRIGATION / IRRIGATOR) ×2 IMPLANT
SET TUBE SMOKE EVAC HIGH FLOW (TUBING) ×2 IMPLANT
SLEEVE XCEL OPT CAN 5 100 (ENDOMECHANICALS) ×4 IMPLANT
STOPCOCK 4 WAY LG BORE MALE ST (IV SETS) ×1 IMPLANT
STRIP CLOSURE SKIN 1/2X4 (GAUZE/BANDAGES/DRESSINGS) ×2 IMPLANT
SUT ETHILON 2 0 PS N (SUTURE) IMPLANT
SUT MNCRL AB 4-0 PS2 18 (SUTURE) ×2 IMPLANT
SUT VICRYL 0 ENDOLOOP (SUTURE) IMPLANT
TOWEL OR 17X26 10 PK STRL BLUE (TOWEL DISPOSABLE) ×2 IMPLANT
TOWEL OR NON WOVEN STRL DISP B (DISPOSABLE) ×1 IMPLANT
TRAY LAPAROSCOPIC (CUSTOM PROCEDURE TRAY) ×2 IMPLANT
TROCAR BLADELESS OPT 5 100 (ENDOMECHANICALS) ×2 IMPLANT
TROCAR XCEL NON-BLD 11X100MML (ENDOMECHANICALS) ×2 IMPLANT

## 2020-02-22 NOTE — Transfer of Care (Signed)
Immediate Anesthesia Transfer of Care Note  Patient: Denise King  Procedure(s) Performed: LAPAROSCOPIC CHOLECYSTECTOMY WITH INTRAOPERATIVE CHOLANGIOGRAM (N/A Abdomen)  Patient Location: PACU  Anesthesia Type:General  Level of Consciousness: awake and alert   Airway & Oxygen Therapy: Patient Spontanous Breathing and Patient connected to face mask oxygen  Post-op Assessment: Report given to RN and Post -op Vital signs reviewed and stable  Post vital signs: Reviewed and stable  Last Vitals:  Vitals Value Taken Time  BP    Temp 36.5 C 02/22/20 1609  Pulse 65 02/22/20 1609  Resp 12 02/22/20 1609  SpO2 100 % 02/22/20 1609  Vitals shown include unvalidated device data.  Last Pain:  Vitals:   02/22/20 0757  TempSrc:   PainSc: 8       Patients Stated Pain Goal: 4 ([id number])  Complications: No apparent anesthesia complications

## 2020-02-22 NOTE — Anesthesia Procedure Notes (Signed)
Procedure Name: Intubation Date/Time: 02/22/2020 2:23 PM Performed by: West Pugh, CRNA Pre-anesthesia Checklist: Patient identified, Emergency Drugs available, Suction available, Patient being monitored and Timeout performed Patient Re-evaluated:Patient Re-evaluated prior to induction Oxygen Delivery Method: Circle system utilized Preoxygenation: Pre-oxygenation with 100% oxygen Induction Type: IV induction Ventilation: Mask ventilation without difficulty Laryngoscope Size: Mac and 3 Tube type: Oral Tube size: 7.0 mm Number of attempts: 1 Airway Equipment and Method: Stylet Placement Confirmation: ETT inserted through vocal cords under direct vision,  positive ETCO2,  CO2 detector and breath sounds checked- equal and bilateral Secured at: 20 cm Tube secured with: Tape Dental Injury: Teeth and Oropharynx as per pre-operative assessment

## 2020-02-22 NOTE — H&P (Signed)
Denise King is an 69 y.o. female.   Chief Complaint: abdominal pain HPI: 69 yo female with abdominal pain found to have gallstones and concern for choledocholithiasis at one point. She continues to have pain as well as diarrhea. She noted increased swelling in her legs and took an extra dose of lasix yesterday.  Past Medical History:  Diagnosis Date  . Acute cystitis   . Acute respiratory failure ([place])   . Allergy    as a child grew out of them  . Anemia    pernicious anemia  . Anxiety   . Arthritis   . Back pain   . Blood transfusion   . CAP (community acquired pneumonia)   . CHF (congestive heart failure) ([place])   . Chronic pain syndrome   . Common bile duct stone   . Depression   . Diverticulosis of colon   . Duodenal ulcer hemorrhagic-after biliary sphincterotomy   . Dysthymia   . Esophagitis   . EtOH dependence ([place])   . Gastritis   . GERD (gastroesophageal reflux disease)   . H/O chest pain Dec. 2013   no work up done  . Hx of colonic polyps   . Hypertension   . Irritable bowel syndrome   . Lymphocytic colitis 02/16/2020  . Osteopenia   . Osteoporosis   . Pneumonia 2019  . Polypharmacy   . Reflux esophagitis   . Right sided sciatica 12/22/2017  . Tobacco use disorder   . Unspecified chronic bronchitis ([place])   . Vertigo   . Vitamin B12 deficiency   . Wears glasses     Past Surgical History:  Procedure Laterality Date  . APPENDECTOMY    . BACK SURGERY  10-09   Dr. Patrice Paradise  . BIOPSY  02/14/2020   Procedure: BIOPSY;  Surgeon: Gatha Mayer, MD;  Location: [facility];  Service: Endoscopy;;  . CARPAL TUNNEL RELEASE Right 08/14/2014   Procedure: RIGHT CARPAL TUNNEL RELEASE AND INJECT LEFT THUMB;  Surgeon: Daryll Brod, MD;  Location: [place];  Service: Orthopedics;  Laterality: Right;  . COLONOSCOPY    . COLONOSCOPY WITH PROPOFOL N/A 02/14/2020   Procedure: COLONOSCOPY WITH PROPOFOL;  Surgeon: Gatha Mayer, MD;  Location: [facility];   Service: Endoscopy;  Laterality: N/A;  . ENDOSCOPIC RETROGRADE CHOLANGIOPANCREATOGRAPHY (ERCP) WITH PROPOFOL N/A 12/25/2019   Procedure: ENDOSCOPIC RETROGRADE CHOLANGIOPANCREATOGRAPHY (ERCP) WITH PROPOFOL;  Surgeon: Gatha Mayer, MD;  Location: [place];  Service: Gastroenterology;  Laterality: N/A;  . ESOPHAGOGASTRODUODENOSCOPY (EGD) WITH PROPOFOL N/A 01/06/2020   Procedure: ESOPHAGOGASTRODUODENOSCOPY (EGD) WITH PROPOFOL;  Surgeon: Irene Shipper, MD;  Location: [facility] END[facility]COPY;  Service: Endoscopy;  Laterality: N/A;  . HOT HEMOSTASIS N/A 01/06/2020   Procedure: HOT HEMOSTASIS (ARGON PLASMA COAGULATION/BICAP);  Surgeon: Irene Shipper, MD;  Location: [facility] ENDOSCOPY;  Service: Endoscopy;  Laterality: N/A;  . INTRAMEDULLARY (IM) NAIL INTERTROCHANTERIC Right 10/01/2018   Procedure: INTRAMEDULLARY (IM) NAIL INTERTROCHANTRIC;  Surgeon: Thornton Park, MD;  Location: [facility];  Service: Orthopedics;  Laterality: Right;  . LAPAROSCOPY N/A 02/21/2013   Procedure: LAPAROSCOPY OPERATIVE;  Surgeon: Margarette Asal, MD;  Location: [facility];  Service: Gynecology;  Laterality: N/A;  REQUESTING 5MM SCOPE WITH CAMERA  . LEFT HEART CATH AND CORONARY ANGIOGRAPHY N/A 05/11/2019   Procedure: LEFT HEART CATH AND CORONARY ANGIOGRAPHY;  Surgeon: Sherren Mocha, MD;  Location: [place] [facility];  Service: Cardiovascular;  Laterality: N/A;  . REMOVAL OF STONES  12/25/2019   Procedure: BALLOON SWEEP;  Surgeon:  Gatha Mayer, MD;  Location: [facility] ENDOSCOPY;  Service: Gastroenterology;;  . Clide Deutscher  01/06/2020   Procedure: Clide Deutscher;  Surgeon: Irene Shipper, MD;  Location: [facility] ENDOSCOPY;  Service: Endoscopy;;  . Joan Mayans  12/25/2019   Procedure: Joan Mayans;  Surgeon: Gatha Mayer, MD;  Location: [place];  Service: Gastroenterology;;  . TONSILLECTOMY    . TUBAL LIGATION    . UPPER GASTROINTESTINAL ENDOSCOPY      Family History  Problem Relation Age of Onset  . Colon cancer Father   . Prostate  cancer Father   . Heart disease Mother        prev MVR, also has DJD  . Colon cancer Brother   . Skin cancer Sister   . Alcohol abuse Daughter   . Bipolar disorder Daughter   . Drug abuse Daughter   . Esophageal cancer Neg Hx   . Rectal cancer Neg Hx   . Stomach cancer Neg Hx    Social History:  reports that she has been smoking cigarettes. She has a 30.00 pack-year smoking history. She has never used smokeless tobacco. She reports current alcohol use of about 3.0 standard drinks of alcohol per week. She reports that she does not use drugs.  Allergies:  Allergies  Allergen Reactions  . Atorvastatin Nausea And Vomiting  . Levofloxacin Other (See Comments)    Reaction: achilles tendon pain  . Omnicef [Cefdinir] Diarrhea    Uncontrollable diarrhea  . Trazodone And Nefazodone Other (See Comments)    "Felt like I was going to faint"  . Sulfa Antibiotics Rash  . Sulfonamide Derivatives Rash    Medications Prior to Admission  Medication Sig Dispense Refill  . ALPRAZolam (XANAX) 0.25 MG tablet Take 1 tablet (0.25 mg total) by mouth 3 (three) times daily as needed for anxiety. 30 tablet 0  . ARIPiprazole (ABILIFY) 2 MG tablet Take 1 tablet (2 mg total) by mouth daily. 90 tablet 0  . budesonide (ENTOCORT EC) 3 MG 24 hr capsule Take 3 capsules (9 mg total) by mouth daily. 90 capsule 1  . busPIRone (BUSPAR) 10 MG tablet Take 1 tablet (10 mg total) by mouth 3 (three) times daily. 90 tablet 5  . Cholecalciferol (VITAMIN D) 50 MCG (2000 UT) tablet Take 2,000 Units by mouth daily after breakfast.     . folic acid (FOLVITE) 1 MG tablet Take 1 tablet (1 mg total) by mouth daily. (Patient taking differently: Take 1 mg by mouth daily after breakfast. ) 90 tablet 3  . furosemide (LASIX) 20 MG tablet Take 20 mg by mouth daily.    Marland Kitchen gabapentin (NEURONTIN) 300 MG capsule Take 300 mg by mouth 3 (three) times daily.    . hyoscyamine (ANASPAZ) 0.125 MG TBDP disintergrating tablet Place 0.125 mg under the  tongue every 6 (six) hours as needed ([name] cramps).     . morphine (MS CONTIN) 60 MG 12 hr tablet Take 60 mg by mouth every 12 (twelve) hours.    . nicotine (NICODERM CQ - DOSED IN MG/24 HOURS) 21 mg/24hr patch Place 1 patch (21 mg total) onto the skin daily. 28 patch 0  . Oxycodone HCl 10 MG TABS Take 10 mg by mouth 4 (four) times daily.     . pantoprazole (PROTONIX) 40 MG tablet Take 1 tablet (40 mg total) by mouth 2 (two) times daily before a meal. 60 tablet 1  . potassium chloride (KLOR-CON) 10 MEQ tablet TAKE 1 TABLET ONCE DAILY. (Patient taking differently: Take 10 mEq  by mouth daily. ) 30 tablet 5  . rosuvastatin (CRESTOR) 5 MG tablet Take 1 tablet (5 mg total) by mouth every Monday, Wednesday, and Friday at 6 PM. 36 tablet 3  . sertraline (ZOLOFT) 100 MG tablet Take 2 tablets (200 mg total) by mouth at bedtime. 180 tablet 0  . vitamin B-12 (CYANOCOBALAMIN) 100 MCG tablet Take 1 tablet (100 mcg total) by mouth daily. (Patient taking differently: Take 100 mcg by mouth daily after breakfast. ) 90 tablet 3  . cholestyramine (QUESTRAN) 4 GM/DOSE powder Take 1 packet (4 g total) by mouth 2 (two) times daily with a meal. (Patient taking differently: Take 4 g by mouth 2 (two) times daily as needed ([name] (take with meals)). ) 378 g 12  . denosumab (PROLIA) 60 MG/ML SOSY injection Inject 60 mg into the skin every 6 (six) months.    . Multiple Vitamin (MULTIVITAMIN WITH MINERALS) TABS tablet Take 1 tablet by mouth daily. 30 tablet 0  . OXYGEN Inhale 2 L/min into the lungs as needed.    . valACYclovir (VALTREX) 500 MG tablet Take 500 mg by mouth 2 (two) times daily as needed (flare ups).      Results for orders placed or performed during the hospital encounter of 02/22/20 (from the past 48 hour(s))  Basic metabolic panel     Status: Abnormal   Collection Time: 02/22/20  7:18 AM  Result Value Ref Range   Sodium 142 135 - 145 mmol/L   Potassium 2.6 (LL) 3.5 - 5.1 mmol/L    Comment: CRITICAL RESULT  CALLED TO, READ BACK BY AND VERIFIED WITH: RUNYON,T. RN AT [id number] 02/22/20 MULLINS,T    Chloride 93 (L) 98 - 111 mmol/L   CO2 38 (H) 22 - 32 mmol/L   Glucose, Bld 62 (L) 70 - 99 mg/dL    Comment: Glucose reference range applies only to samples taken after fasting for at least 8 hours.   BUN <5 (L) 8 - 23 mg/dL   Creatinine, Ser 0.71 0.44 - 1.00 mg/dL   Calcium 7.5 (L) 8.9 - 10.3 mg/dL   GFR calc non Af Amer >60 >60 mL/min   GFR calc Af Amer >60 >60 mL/min   Anion gap 11 5 - 15    Comment: Performed at [facility], [place] [address] [place], [place] [address]   No results found.  Review of Systems  Constitutional: Negative for chills and fever.  HENT: Negative for hearing loss.   Respiratory: Negative for cough.   Cardiovascular: Negative for chest pain and palpitations.  Gastrointestinal: Negative for abdominal pain, nausea and vomiting.  Genitourinary: Negative for dysuria and urgency.  Musculoskeletal: Negative for myalgias and neck pain.  Skin: Negative for rash.  Neurological: Negative for dizziness and headaches.  Hematological: Does not bruise/bleed easily.  Psychiatric/Behavioral: Negative for suicidal ideas.    Blood pressure 127/76, pulse 82, temperature 98.6 F (37 C), temperature source Oral, resp. rate 18, SpO2 93 %. Physical Exam  Vitals reviewed. Constitutional: She is oriented to person, place, and time. She appears well-developed and well-nourished.  HENT:  Head: Normocephalic and atraumatic.  Eyes: Pupils are equal, round, and reactive to light. Conjunctivae and EOM are normal.  Cardiovascular: Normal rate and regular rhythm.  Respiratory: Effort normal and breath sounds normal.  GI: Soft. Bowel sounds are normal. She exhibits no distension. There is abdominal tenderness in the right upper quadrant and epigastric area.  Musculoskeletal:        General: Normal range  of motion.     Cervical back: Normal range of motion and neck supple.   Neurological: She is alert and oriented to person, place, and time.  Skin: Skin is warm and dry.  Psychiatric: She has a normal mood and affect. Her behavior is normal.     Assessment/Plan 69 yo female with chronic calculous cholecystitis. She has hypokalemia today -plan to infuse potassium IV today and recheck laboratory values. -plan for lap chole with IOC -ERAS protocol -may require observation or admission due to electrolyte abnormality  Mickeal Skinner, MD 02/22/2020, 9:24 AM

## 2020-02-22 NOTE — Progress Notes (Signed)
CRITICAL VALUE ALERT  Critical Value:  Potassium 2.6; glucose 62  Date & Time Notied:  2//25/2021 0840  Provider Notified: paged Dr Kieth Brightly and Dr Conrad Groton [place]   Orders Received/Actions taken: no orders at current time

## 2020-02-22 NOTE — Op Note (Signed)
PATIENT:  Denise King  69 y.o. female  PRE-OPERATIVE DIAGNOSIS:  CHOLEDOCHOLITHIASIS  POST-OPERATIVE DIAGNOSIS:  CHOLEDOCHOLITHIASIS  PROCEDURE:  Procedure(s): LAPAROSCOPIC CHOLECYSTECTOMY WITH INTRAOPERATIVE CHOLANGIOGRAM   SURGEON:  Surgeon(s): Melia Hopes, Arta Bruce, MD  ASSISTANT: none  ANESTHESIA:   local and general  Indications for procedure: [name] is a 69 y.o. female with symptoms of Abdominal pain and Nausea and vomiting consistent with gallbladder disease, Confirmed by Ultrasound.  Description of procedure: The patient was brought into the operative suite, placed supine. Anesthesia was administered with endotracheal tube. Patient was strapped in place and foot board was secured. All pressure points were offloaded by foam padding. The patient was prepped and draped in the usual sterile fashion.  A small incision was made to the right of the umbilicus. A 27mm trocar was inserted into the peritoneal cavity with optical entry. Pneumoperitoneum was applied with high flow low pressure. 2 34mm trocars were placed in the RUQ. A 92mm trocar was placed in the subxiphoid space. Marcaine was infused to the subxiphoid space and lateral upper right abdomen in the transversus abdominis plane. Next the patient was placed in reverse trendelenberg. The gallbladder was distended with small inflammatory changes.  The gallbladder was retracted cephalad and lateral. The peritoneum was reflected off the infundibulum working lateral to medial. The cystic duct and cystic artery were identified and further dissection revealed a critical view, due to concern for choledocholithiasis a cholangiogram was performed with [place]. There was a small amount of sediment within the CBD that seemed to flush through with additional contrast. The cystic duct and cystic artery were doubly clipped and ligated.   The gallbladder was removed off the liver bed with cautery. The Gallbladder was placed in  a specimen bag. The gallbladder fossa was irrigated and hemostasis was applied with cautery. The gallbladder was removed via the 72mm trocar. The fascial defect was closed with interrupted 0 vicryl suture via laparoscopic trans-fascial suture passer. Pneumoperitoneum was removed, all trocar were removed. All incisions were closed with 4-0 monocryl subcuticular stitch. The patient woke from anesthesia and was brought to PACU in stable condition. All counts were correct  Findings: small amount of sediment within the CBD that seemed to flush through with additional contrast.  Specimen: gallbladder  Blood loss: 20 ml  Local anesthesia: 30 ml marcaine  Complications: none  PLAN OF CARE: Discharge to home after PACU  PATIENT DISPOSITION:  PACU - hemodynamically stable.  Gurney Maxin, M.D. General, Bariatric, & Minimally Invasive Surgery [facility]

## 2020-02-22 NOTE — Anesthesia Postprocedure Evaluation (Signed)
Anesthesia Post Note  Patient: Denise King  Procedure(s) Performed: LAPAROSCOPIC CHOLECYSTECTOMY WITH INTRAOPERATIVE CHOLANGIOGRAM (N/A Abdomen)     Patient location during evaluation: PACU Anesthesia Type: General Level of consciousness: awake and alert Pain management: pain level controlled Vital Signs Assessment: post-procedure vital signs reviewed and stable Respiratory status: spontaneous breathing, nonlabored ventilation, respiratory function stable and patient connected to nasal cannula oxygen Cardiovascular status: blood pressure returned to baseline and stable Postop Assessment: no apparent nausea or vomiting Anesthetic complications: no    Last Vitals:  Vitals:   02/22/20 1709 02/22/20 1746  BP: 107/86 139/82  Pulse: 73 69  Resp: 14   Temp: 36.5 C   SpO2: 94% 94%    Last Pain:  Vitals:   02/22/20 1709  TempSrc: Oral  PainSc:                  Jaylen Claude DAVID

## 2020-02-23 DIAGNOSIS — K8060 Calculus of gallbladder and bile duct with cholecystitis, unspecified, without obstruction: Secondary | ICD-10-CM | POA: Diagnosis not present

## 2020-02-23 LAB — COMPREHENSIVE METABOLIC PANEL
ALT: 11 U/L (ref 0–44)
AST: 30 U/L (ref 15–41)
Albumin: 2.8 g/dL — ABNORMAL LOW (ref 3.5–5.0)
Alkaline Phosphatase: 67 U/L (ref 38–126)
Anion gap: 8 (ref 5–15)
BUN: <5 mg/dL — ABNORMAL LOW (ref 8–23)
CO2: 32 mmol/L (ref 22–32)
Calcium: 6.8 mg/dL — ABNORMAL LOW (ref 8.9–10.3)
Chloride: 100 mmol/L (ref 98–111)
Creatinine, Ser: 0.65 mg/dL (ref 0.44–1.00)
GFR calc Af Amer: >60 mL/min (ref >60)
GFR calc non Af Amer: >60 mL/min (ref >60)
Glucose, Bld: 128 mg/dL — ABNORMAL HIGH (ref 70–99)
Potassium: 3.4 mmol/L — ABNORMAL LOW (ref 3.5–5.1)
Sodium: 140 mmol/L (ref 135–145)
Total Bilirubin: 0.6 mg/dL (ref 0.3–1.2)
Total Protein: 5.5 g/dL — ABNORMAL LOW (ref 6.5–8.1)

## 2020-02-23 MED ORDER — MORPHINE SULFATE ER 30 MG PO TBCR
60.0000 mg | EXTENDED_RELEASE_TABLET | Freq: Two times a day (BID) | ORAL | Status: DC
  Administered 2020-02-23 (×2): 60 mg via ORAL
  Filled 2020-02-23 (×2): qty 2

## 2020-02-23 MED ORDER — MORPHINE SULFATE (PF) 2 MG/ML IV SOLN
2.0000 mg | INTRAVENOUS | Status: DC | PRN
  Administered 2020-02-23 (×2): 2 mg via INTRAVENOUS
  Filled 2020-02-23 (×3): qty 1

## 2020-02-23 NOTE — Discharge Summary (Signed)
Physician Discharge Summary  Denise King [id number] DOB: January 19, 1951 DOA: 02/22/2020  PCP: Biagio Borg, MD  Admit date: 02/22/2020 Discharge date: 02/23/2020  Recommendations for Outpatient Follow-up:  1. Labs 3/2 2. Follow with Cyleigh Massaro in 3 weeks   Discharge Diagnoses:  Active Problems:   Choledocholithiasis   Surgical Procedure: lap chole with IOC  Discharge Condition: Good Disposition: Home  Diet recommendation: low fat diet for 2 weeks   Hospital Course:  69 yo female underwent lap chole with IOC for choledocholithiasis. She had increased pain in pacu and was watched overnight. POD 1 her pain was manageable and she was discharged home.  We had a discussion about the IOC with small sediment and lab values today with options to stay in hospital for GI consult vs repeat labs in 1 week. We will repeat labs in 1 week.  Discharge Instructions  Discharge Instructions    Call MD for:  difficulty breathing, headache or visual disturbances   Complete by: As directed    Call MD for:  hives   Complete by: As directed    Call MD for:  persistant nausea and vomiting   Complete by: As directed    Call MD for:  redness, tenderness, or signs of infection (pain, swelling, redness, odor or green/yellow discharge around incision site)   Complete by: As directed    Call MD for:  severe uncontrolled pain   Complete by: As directed    Call MD for:  temperature >100.4   Complete by: As directed    Diet - low sodium heart healthy   Complete by: As directed    Discharge   Complete by: As directed    Discharge wound care:   Complete by: As directed    Ok to shower tomorrow. Glue will likely peel off in 1-3 weeks. No bandage required   Driving Restrictions   Complete by: As directed    No driving while on narcotics   Increase activity slowly   Complete by: As directed    Lifting restrictions   Complete by: As directed    No lifting greater than 20 pounds for 3 weeks      Allergies as of 02/23/2020      Reactions   Atorvastatin Nausea And Vomiting   Levofloxacin Other (See Comments)   Reaction: achilles tendon pain   Omnicef [cefdinir] Diarrhea   Uncontrollable diarrhea   Trazodone And Nefazodone Other (See Comments)   "Felt like I was going to faint"   Sulfa Antibiotics Rash   Sulfonamide Derivatives Rash      Medication List    TAKE these medications   ALPRAZolam 0.25 MG tablet Commonly known as: XANAX Take 1 tablet (0.25 mg total) by mouth 3 (three) times daily as needed for anxiety.   ARIPiprazole 2 MG tablet Commonly known as: ABILIFY Take 1 tablet (2 mg total) by mouth daily.   budesonide 3 MG 24 hr capsule Commonly known as: ENTOCORT EC Take 3 capsules (9 mg total) by mouth daily.   busPIRone 10 MG tablet Commonly known as: BUSPAR Take 1 tablet (10 mg total) by mouth 3 (three) times daily.   cholestyramine 4 GM/DOSE powder Commonly known as: QUESTRAN Take 1 packet (4 g total) by mouth 2 (two) times daily with a meal. What changed:   when to take this  reasons to take this   denosumab 60 MG/ML Sosy injection Commonly known as: PROLIA Inject 60 mg into the skin every 6 (six) months.  folic acid 1 MG tablet Commonly known as: FOLVITE Take 1 tablet (1 mg total) by mouth daily. What changed: when to take this   furosemide 20 MG tablet Commonly known as: LASIX Take 20 mg by mouth daily.   gabapentin 300 MG capsule Commonly known as: NEURONTIN Take 300 mg by mouth 3 (three) times daily.   hyoscyamine 0.125 MG Tbdp disintergrating tablet Commonly known as: ANASPAZ Place 0.125 mg under the tongue every 6 (six) hours as needed ([name] cramps).   ibuprofen 800 MG tablet Commonly known as: ADVIL Take 1 tablet (800 mg total) by mouth every 8 (eight) hours as needed.   morphine 60 MG 12 hr tablet Commonly known as: MS CONTIN Take 60 mg by mouth every 12 (twelve) hours.   multivitamin with minerals Tabs tablet Take 1  tablet by mouth daily.   nicotine 21 mg/24hr patch Commonly known as: NICODERM CQ - dosed in mg/24 hours Place 1 patch (21 mg total) onto the skin daily.   Oxycodone HCl 10 MG Tabs Take 10 mg by mouth 4 (four) times daily.   OXYGEN Inhale 2 L/min into the lungs as needed.   pantoprazole 40 MG tablet Commonly known as: PROTONIX Take 1 tablet (40 mg total) by mouth 2 (two) times daily before a meal.   potassium chloride 10 MEQ tablet Commonly known as: KLOR-CON TAKE 1 TABLET ONCE DAILY.   rosuvastatin 5 MG tablet Commonly known as: CRESTOR Take 1 tablet (5 mg total) by mouth every Monday, Wednesday, and Friday at 6 PM.   sertraline 100 MG tablet Commonly known as: ZOLOFT Take 2 tablets (200 mg total) by mouth at bedtime.   valACYclovir 500 MG tablet Commonly known as: VALTREX Take 500 mg by mouth 2 (two) times daily as needed (flare ups).   vitamin B-12 100 MCG tablet Commonly known as: CYANOCOBALAMIN Take 1 tablet (100 mcg total) by mouth daily. What changed: when to take this   Vitamin D 50 MCG (2000 UT) tablet Take 2,000 Units by mouth daily after breakfast.            Discharge Care Instructions  (From admission, onward)         Start     Ordered   02/22/20 0000  Discharge wound care:    Comments: Ok to shower tomorrow. Glue will likely peel off in 1-3 weeks. No bandage required   02/22/20 1515            The results of significant diagnostics from this hospitalization (including imaging, microbiology, ancillary and laboratory) are listed below for reference.    Significant Diagnostic Studies: DG Cholangiogram Operative  Result Date: 02/22/2020 CLINICAL DATA:  Intraoperative cholangiogram during laparoscopic cholecystectomy. EXAM: INTRAOPERATIVE CHOLANGIOGRAM FLUOROSCOPY TIME:  26 seconds (5.42 mGy) COMPARISON:  ERCP-12/25/2019; MRCP-12/24/2019 FINDINGS: Intraoperative cholangiographic images of the right upper abdominal quadrant during laparoscopic  cholecystectomy are provided for review. Surgical clips overlie the expected location of the gallbladder fossa. Contrast injection demonstrates selective cannulation of the central aspect of the cystic duct. There is passage of contrast through the central aspect of the cystic duct with filling of a non dilated common bile duct. There is passage of contrast though the CBD and into the descending portion of the duodenum. There is minimal reflux of injected contrast into the common hepatic duct and central aspect of the non dilated intrahepatic biliary system. There is a minimal amount of persistent nonocclusive debris within distal aspect of the CBD. IMPRESSION: Minimal amount of persistent  nonocclusive debris within the distal aspect of the CBD, worrisome for nonocclusive choledocholithiasis. Correlation with the operative report is advised. Electronically Signed   By: Sandi Mariscal M.D.   On: 02/22/2020 15:42   ECHOCARDIOGRAM COMPLETE  Result Date: 02/01/2020   ECHOCARDIOGRAM REPORT   Patient Name:   DELAINIE NEITZKE Date of Exam: 02/01/2020 Medical Rec #:  [id number]           Height:       61.0 in Accession #:    [id number]          Weight:       114.0 lb Date of Birth:  [birth year]           BSA:          1.49 m Patient Age:    64 years            BP:           104/68 mmHg Patient Gender: F                   HR:           74 bpm. Exam Location:  [address] Procedure: 2D Echo, 3D Echo, Cardiac Doppler, Color Doppler and Strain Analysis Indications:    [id number] Acute Diastolic CHF  History:        Patient has prior history of Echocardiogram examinations, most                 recent 04/11/2019. Risk Factors:Hypertension. Pneumonia.  Sonographer:    Wilford Sports Rodgers-Jones RDCS Referring Phys: [place]  1. Left ventricular ejection fraction, by visual estimation, is 60 to 65%. The left ventricle has normal function. There is mildly increased left ventricular hypertrophy.  2. Elevated left  atrial pressure.  3. Left ventricular diastolic parameters are consistent with Grade II diastolic dysfunction (pseudonormalization).  4. The left ventricle has no regional wall motion abnormalities.  5. Global right ventricle has normal systolic function.The right ventricular size is normal. No increase in right ventricular wall thickness.  6. Left atrial size was mildly dilated.  7. Right atrial size was normal.  8. The mitral valve is normal in structure. Mild mitral valve regurgitation. No evidence of mitral stenosis.  9. The tricuspid valve is normal in structure. 10. The tricuspid valve is normal in structure. Tricuspid valve regurgitation is mild. 11. The aortic valve is normal in structure. Aortic valve regurgitation is not visualized. No evidence of aortic valve sclerosis or stenosis. 12. The pulmonic valve was normal in structure. Pulmonic valve regurgitation is not visualized. 13. Normal pulmonary artery systolic pressure. 14. The inferior vena cava is normal in size with greater than 50% respiratory variability, suggesting right atrial pressure of 3 mmHg. 15. The average left ventricular global longitudinal strain is -20.3 %. FINDINGS  Left Ventricle: Left ventricular ejection fraction, by visual estimation, is 60 to 65%. The left ventricle has normal function. The average left ventricular global longitudinal strain is -20.3 %. The left ventricle has no regional wall motion abnormalities. There is mildly increased left ventricular hypertrophy. Concentric left ventricular hypertrophy. Left ventricular diastolic parameters are consistent with Grade II diastolic dysfunction (pseudonormalization). Elevated left atrial pressure. Right Ventricle: The right ventricular size is normal. No increase in right ventricular wall thickness. Global RV systolic function is has normal systolic function. Left Atrium: Left atrial size was mildly dilated. Right Atrium: Right atrial size was normal in size Pericardium: There  is no evidence  of pericardial effusion. Mitral Valve: The mitral valve is normal in structure. Mild mitral valve regurgitation. No evidence of mitral valve stenosis by observation. Tricuspid Valve: The tricuspid valve is normal in structure. Tricuspid valve regurgitation is mild. Aortic Valve: The aortic valve is normal in structure. Aortic valve regurgitation is not visualized. The aortic valve is structurally normal, with no evidence of sclerosis or stenosis. Pulmonic Valve: The pulmonic valve was normal in structure. Pulmonic valve regurgitation is not visualized. Pulmonic regurgitation is not visualized. Aorta: The aortic root, ascending aorta and aortic arch are all structurally normal, with no evidence of dilitation or obstruction. Venous: The inferior vena cava is normal in size with greater than 50% respiratory variability, suggesting right atrial pressure of 3 mmHg. IAS/Shunts: No atrial level shunt detected by color flow Doppler. There is no evidence of a patent foramen ovale. No ventricular septal defect is seen or detected. There is no evidence of an atrial septal defect.  LEFT VENTRICLE PLAX 2D LVIDd:         3.80 cm  Diastology LVIDs:         2.70 cm  LV e' lateral:   7.72 cm/s LV PW:         1.00 cm  LV E/e' lateral: 16.2 LV IVS:        1.00 cm  LV e' medial:    7.18 cm/s LVOT diam:     1.90 cm  LV E/e' medial:  17.4 LV SV:         35 ml LV SV Index:   23.37    2D Longitudinal Strain LVOT Area:     2.84 cm 2D Strain GLS (A2C):   -16.5 %                         2D Strain GLS (A3C):   -22.5 %                         2D Strain GLS (A4C):   -21.8 %                         2D Strain GLS Avg:     -20.3 %                          3D Volume EF:                         3D EF:        70 %                         LV EDV:       81 ml                         LV ESV:       24 ml                         LV SV:        57 ml RIGHT VENTRICLE RV Basal diam:  3.10 cm RV S prime:     13.70 cm/s TAPSE (M-mode): 2.5 cm  LEFT ATRIUM             Index       RIGHT  ATRIUM          Index LA diam:        4.00 cm 2.69 cm/m  RA Area:     8.51 cm LA Vol (A2C):   40.6 ml 27.30 ml/m RA Volume:   16.80 ml 11.30 ml/m LA Vol (A4C):   28.7 ml 19.30 ml/m LA Biplane Vol: 37.1 ml 24.94 ml/m  AORTIC VALVE LVOT Vmax:   120.00 cm/s LVOT Vmean:  81.900 cm/s LVOT VTI:    0.246 m  AORTA Ao Root diam: 3.00 cm MITRAL VALVE MV Area (PHT): 4.21 cm              SHUNTS MV PHT:        52.20 msec            Systemic VTI:  0.25 m MV Decel Time: 180 msec              Systemic Diam: 1.90 cm MV E velocity: 125.00 cm/s 103 cm/s MV A velocity: 96.70 cm/s  70.3 cm/s MV E/A ratio:  1.29        1.5  Ena Dawley MD Electronically signed by Ena Dawley MD Signature Date/Time: 02/01/2020/7:24:38 PM    Final     Labs: Basic Metabolic Panel: Recent Labs  Lab 02/19/20 1422 02/22/20 0718 02/22/20 1237 02/22/20 2030 02/23/20 0345  NA 143 142 139  --  140  K 2.5* 2.6* 3.3*  --  3.4*  CL 95* 93* 97*  --  100  CO2 37* 38* 36*  --  32  GLUCOSE 104* 62* 87  --  128*  BUN 6* <5* <5*  --  <5*  CREATININE 0.83 0.71 0.61 0.70 0.65  CALCIUM 8.7* 7.5* 6.8*  --  6.8*   Liver Function Tests: Recent Labs  Lab 02/19/20 1422 02/23/20 0345  AST 22 30  ALT 8 11  ALKPHOS 84 67  BILITOT 0.2* 0.6  PROT 6.8 5.5*  ALBUMIN 3.6 2.8*    CBC: Recent Labs  Lab 02/19/20 1422 02/22/20 2030  WBC 7.5 13.0*  NEUTROABS 3.9  --   HGB 13.9 13.9  HCT 43.2 43.6  MCV 95.8 96.9  PLT 285 265    CBG: No results for input(s): GLUCAP in the last 168 hours.  Active Problems:   Choledocholithiasis  Time coordinating discharge: 15 min

## 2020-02-23 NOTE — Progress Notes (Signed)
Dr Kieth Brightly called to check on the patient.  Her blood pressure is better, patient wants to go home. Will discharge her home with daughter.

## 2020-02-23 NOTE — Progress Notes (Signed)
  Dr Kieth Brightly notified about patients pain and blood pressure.  Will continue to monitor.

## 2020-02-26 LAB — SURGICAL PATHOLOGY

## 2020-02-27 ENCOUNTER — Other Ambulatory Visit (INDEPENDENT_AMBULATORY_CARE_PROVIDER_SITE_OTHER): Payer: Medicare HMO

## 2020-02-27 ENCOUNTER — Other Ambulatory Visit: Payer: Self-pay

## 2020-02-27 DIAGNOSIS — E611 Iron deficiency: Secondary | ICD-10-CM

## 2020-02-27 DIAGNOSIS — E559 Vitamin D deficiency, unspecified: Secondary | ICD-10-CM

## 2020-02-27 DIAGNOSIS — E538 Deficiency of other specified B group vitamins: Secondary | ICD-10-CM

## 2020-02-27 LAB — VITAMIN B12: Vitamin B-12: 434 pg/mL (ref 211–911)

## 2020-02-27 LAB — IBC PANEL
Iron: 47 ug/dL (ref 42–145)
Saturation Ratios: 20.3 % (ref 20.0–50.0)
Transferrin: 165 mg/dL — ABNORMAL LOW (ref 212.0–360.0)

## 2020-02-27 LAB — VITAMIN D 25 HYDROXY (VIT D DEFICIENCY, FRACTURES): VITD: 48.43 ng/mL (ref 30.00–100.00)

## 2020-02-29 ENCOUNTER — Ambulatory Visit: Payer: Medicare HMO | Attending: Internal Medicine

## 2020-02-29 DIAGNOSIS — Z23 Encounter for immunization: Secondary | ICD-10-CM | POA: Insufficient documentation

## 2020-02-29 NOTE — Progress Notes (Signed)
   Covid-[facility] Vaccination [facility]  Name:  REVONDA NOBLIN    MRN: [id number] DOB: [birth year]  02/29/2020  Ms. Piche was observed post Covid-19 immunization for 15 minutes without incident. She was provided with Vaccine Information Sheet and instruction to access the V-Safe system.   Ms. Boepple was instructed to call 911 with any severe reactions post vaccine: Marland Kitchen Difficulty breathing  . Swelling of face and throat  . A fast heartbeat  . A bad rash all over body  . Dizziness and weakness

## 2020-02-29 NOTE — Progress Notes (Signed)
Was informed that pt c/o " heart racing " during 15 min observation post COVID vaccination.  Spoke with pt and daughter, and was informed that pt had gallbladder surgery 1 week ago and had mild pain with taking deep breath.  VSs taken : BP  111/75 , P 84, R 18, O2 sat 100 RA. Pt stated she was feeling much better, stated feeling a bit anxious. Denied shortness of breath.  Water offered.  Pt stated she was feeling much better after drinking water. Pt was stable with daughter at chair side prior to nurse leaving.

## 2020-03-03 DIAGNOSIS — I509 Heart failure, unspecified: Secondary | ICD-10-CM | POA: Diagnosis not present

## 2020-03-09 DIAGNOSIS — I509 Heart failure, unspecified: Secondary | ICD-10-CM | POA: Diagnosis not present

## 2020-03-11 ENCOUNTER — Ambulatory Visit: Payer: Medicare HMO | Admitting: Internal Medicine

## 2020-03-12 DIAGNOSIS — G894 Chronic pain syndrome: Secondary | ICD-10-CM | POA: Diagnosis not present

## 2020-03-12 DIAGNOSIS — M25551 Pain in right hip: Secondary | ICD-10-CM | POA: Diagnosis not present

## 2020-03-12 DIAGNOSIS — Z79891 Long term (current) use of opiate analgesic: Secondary | ICD-10-CM | POA: Diagnosis not present

## 2020-03-12 DIAGNOSIS — M961 Postlaminectomy syndrome, not elsewhere classified: Secondary | ICD-10-CM | POA: Diagnosis not present

## 2020-03-14 ENCOUNTER — Ambulatory Visit (INDEPENDENT_AMBULATORY_CARE_PROVIDER_SITE_OTHER): Payer: Medicare HMO | Admitting: Internal Medicine

## 2020-03-14 ENCOUNTER — Other Ambulatory Visit: Payer: Self-pay

## 2020-03-14 ENCOUNTER — Encounter: Payer: Self-pay | Admitting: Internal Medicine

## 2020-03-14 VITALS — BP 126/74 | HR 82 | Temp 98.1°F | Ht 61.0 in | Wt 110.0 lb

## 2020-03-14 DIAGNOSIS — Z0000 Encounter for general adult medical examination without abnormal findings: Secondary | ICD-10-CM

## 2020-03-14 NOTE — Progress Notes (Signed)
Subjective:    Patient ID: Denise King, female    DOB: 29-Apr-1951, 69 y.o.   MRN: [id number]  HPI  Here for wellness and f/u;  Overall doing ok;  Pt denies Chest pain, worsening SOB, DOE, wheezing, orthopnea, PND, worsening LE edema, palpitations, dizziness or syncope.  Pt denies neurological change such as new headache, facial or extremity weakness.  Pt denies polydipsia, polyuria, or low sugar symptoms. Pt states overall good compliance with treatment and medications, good tolerability, and has been trying to follow appropriate diet.  Pt denies worsening depressive symptoms, suicidal ideation or panic. No fever, night sweats, wt loss, loss of appetite, or other constitutional symptoms.  Pt states good ability with ADL's, has low fall risk, home safety reviewed and adequate, no other significant changes in hearing or vision,  Has had recent surgury, hard to gain wt but working on ensure.  Denies worsening reflux, abd pain, dysphagia, n/v, bowel change or blood. Wt Readings from Last 3 Encounters:  03/14/20 110 lb (49.9 kg)  02/22/20 116 lb (52.6 kg)  02/21/20 117 lb (53.1 kg)   Past Medical History:  Diagnosis Date  . Acute cystitis   . Acute respiratory failure ([place])   . Allergy    as a child grew out of them  . Anemia    pernicious anemia  . Anxiety   . Arthritis   . Back pain   . Blood transfusion   . CAP (community acquired pneumonia)   . CHF (congestive heart failure) ([place])   . Chronic pain syndrome   . Common bile duct stone   . Depression   . Diverticulosis of colon   . Duodenal ulcer hemorrhagic-after biliary sphincterotomy   . Dysthymia   . Esophagitis   . EtOH dependence ([place])   . Gastritis   . GERD (gastroesophageal reflux disease)   . H/O chest pain Dec. 2013   no work up done  . Hx of colonic polyps   . Hypertension   . Irritable bowel syndrome   . Lymphocytic colitis 02/16/2020  . Osteopenia   . Osteoporosis   . Pneumonia 2019  . Polypharmacy   .  Reflux esophagitis   . Right sided sciatica 12/22/2017  . Tobacco use disorder   . Unspecified chronic bronchitis ([place])   . Vertigo   . Vitamin B12 deficiency   . Wears glasses    Past Surgical History:  Procedure Laterality Date  . APPENDECTOMY    . BACK SURGERY  10-09   Dr. Patrice Paradise  . BIOPSY  02/14/2020   Procedure: BIOPSY;  Surgeon: Gatha Mayer, MD;  Location: [facility];  Service: Endoscopy;;  . CARPAL TUNNEL RELEASE Right 08/14/2014   Procedure: RIGHT CARPAL TUNNEL RELEASE AND INJECT LEFT THUMB;  Surgeon: Daryll Brod, MD;  Location: [place];  Service: Orthopedics;  Laterality: Right;  . CHOLECYSTECTOMY N/A 02/22/2020   Procedure: LAPAROSCOPIC CHOLECYSTECTOMY WITH INTRAOPERATIVE CHOLANGIOGRAM;  Surgeon: Kieth Brightly Arta Bruce, MD;  [facility];  Service: General;  Laterality: N/A;  . COLONOSCOPY    . COLONOSCOPY WITH PROPOFOL N/A 02/14/2020   Procedure: COLONOSCOPY WITH PROPOFOL;  Surgeon: Gatha Mayer, MD;  [facility];  Service: Endoscopy;  Laterality: N/A;  . ENDOSCOPIC RETROGRADE CHOLANGIOPANCREATOGRAPHY (ERCP) WITH PROPOFOL N/A 12/25/2019   Procedure: ENDOSCOPIC RETROGRADE CHOLANGIOPANCREATOGRAPHY (ERCP) WITH PROPOFOL;  Surgeon: Gatha Mayer, MD;  Location: [place];  Service: Gastroenterology;  Laterality: N/A;  . ESOPHAGOGASTRODUODENOSCOPY (EGD) WITH PROPOFOL N/A 01/06/2020   Procedure:  ESOPHAGOGASTRODUODENOSCOPY (EGD) WITH PROPOFOL;  Surgeon: Irene Shipper, MD;  Location: [place];  Service: Endoscopy;  Laterality: N/A;  . HOT HEMOSTASIS N/A 01/06/2020   Procedure: HOT HEMOSTASIS (ARGON PLASMA COAGULATION/BICAP);  Surgeon: Irene Shipper, MD;  Location: [facility];  Service: Endoscopy;  Laterality: N/A;  . INTRAMEDULLARY (IM) NAIL INTERTROCHANTERIC Right 10/01/2018   Procedure: INTRAMEDULLARY (IM) NAIL INTERTROCHANTRIC;  Surgeon: Thornton Park, MD;  Location: [facility];  Service: Orthopedics;  Laterality: Right;  . LAPAROSCOPY N/A  02/21/2013   Procedure: LAPAROSCOPY OPERATIVE;  Surgeon: Margarette Asal, MD;  Location: [place] [facility];  Service: Gynecology;  Laterality: N/A;  REQUESTING 5MM SCOPE WITH CAMERA  . LEFT HEART CATH AND CORONARY ANGIOGRAPHY N/A 05/11/2019   Procedure: LEFT HEART CATH AND CORONARY ANGIOGRAPHY;  Surgeon: Sherren Mocha, MD;  Location: [place] CV LAB;  Service: Cardiovascular;  Laterality: N/A;  . REMOVAL OF STONES  12/25/2019   Procedure: Karlyn Agee;  Surgeon: Gatha Mayer, MD;  Location: [facility] ENDOSCOPY;  Service: Gastroenterology;;  . Clide Deutscher  01/06/2020   Procedure: Clide Deutscher;  Surgeon: Irene Shipper, MD;  Location: [facility] ENDOSCOPY;  Service: Endoscopy;;  . Joan Mayans  12/25/2019   Procedure: SPHINCTEROTOMY;  Surgeon: Gatha Mayer, MD;  Location: [place];  Service: Gastroenterology;;  . TONSILLECTOMY    . TUBAL LIGATION    . UPPER GASTROINTESTINAL ENDOSCOPY      reports that she has been smoking cigarettes. She has a 30.00 pack-year smoking history. She has never used smokeless tobacco. She reports current alcohol use of about 3.0 standard drinks of alcohol per week. She reports that she does not use drugs. family history includes Alcohol abuse in her daughter; Bipolar disorder in her daughter; Colon cancer in her brother and father; Drug abuse in her daughter; Heart disease in her mother; Prostate cancer in her father; Skin cancer in her sister. Allergies  Allergen Reactions  . Atorvastatin Nausea And Vomiting  . Levofloxacin Other (See Comments)    Reaction: achilles tendon pain  . Omnicef [Cefdinir] Diarrhea    Uncontrollable diarrhea  . Trazodone And Nefazodone Other (See Comments)    "Felt like I was going to faint"  . Sulfa Antibiotics Rash  . Sulfonamide Derivatives Rash   Current Outpatient Medications on File Prior to Visit  Medication Sig Dispense Refill  . ALPRAZolam (XANAX) 0.25 MG tablet Take 1 tablet (0.25 mg total) by mouth 3 (three) times daily as needed  for anxiety. 30 tablet 0  . ARIPiprazole (ABILIFY) 2 MG tablet Take 1 tablet (2 mg total) by mouth daily. 90 tablet 0  . budesonide (ENTOCORT EC) 3 MG 24 hr capsule Take 3 capsules (9 mg total) by mouth daily. 90 capsule 1  . busPIRone (BUSPAR) 10 MG tablet Take 1 tablet (10 mg total) by mouth 3 (three) times daily. 90 tablet 5  . Cholecalciferol (VITAMIN D) 50 MCG (2000 UT) tablet Take 2,000 Units by mouth daily after breakfast.     . denosumab (PROLIA) 60 MG/ML SOSY injection Inject 60 mg into the skin every 6 (six) months.    . folic acid (FOLVITE) 1 MG tablet Take 1 tablet (1 mg total) by mouth daily. (Patient taking differently: Take 1 mg by mouth daily after breakfast. ) 90 tablet 3  . furosemide (LASIX) 20 MG tablet Take 20 mg by mouth daily.    Marland Kitchen gabapentin (NEURONTIN) 300 MG capsule Take 300 mg by mouth 3 (three) times daily.    . hyoscyamine (ANASPAZ) 0.125 MG TBDP  disintergrating tablet Place 0.125 mg under the tongue every 6 (six) hours as needed ([name] cramps).     Marland Kitchen ibuprofen (ADVIL) 800 MG tablet Take 1 tablet (800 mg total) by mouth every 8 (eight) hours as needed. 30 tablet 0  . morphine (MS CONTIN) 60 MG 12 hr tablet Take 60 mg by mouth every 12 (twelve) hours.    . Multiple Vitamin (MULTIVITAMIN WITH MINERALS) TABS tablet Take 1 tablet by mouth daily. 30 tablet 0  . nicotine (NICODERM CQ - DOSED IN MG/24 HOURS) 21 mg/24hr patch Place 1 patch (21 mg total) onto the skin daily. 28 patch 0  . Oxycodone HCl 10 MG TABS Take 10 mg by mouth 4 (four) times daily.     . OXYGEN Inhale 2 L/min into the lungs as needed.    . pantoprazole (PROTONIX) 40 MG tablet Take 1 tablet (40 mg total) by mouth 2 (two) times daily before a meal. 60 tablet 1  . potassium chloride (KLOR-CON) 10 MEQ tablet TAKE 1 TABLET ONCE DAILY. (Patient taking differently: Take 10 mEq by mouth daily. ) 30 tablet 5  . rosuvastatin (CRESTOR) 5 MG tablet Take 1 tablet (5 mg total) by mouth every Monday, Wednesday, and Friday  at 6 PM. 36 tablet 3  . sertraline (ZOLOFT) 100 MG tablet Take 2 tablets (200 mg total) by mouth at bedtime. 180 tablet 0  . valACYclovir (VALTREX) 500 MG tablet Take 500 mg by mouth 2 (two) times daily as needed (flare ups).    . vitamin B-12 (CYANOCOBALAMIN) 100 MCG tablet Take 1 tablet (100 mcg total) by mouth daily. (Patient taking differently: Take 100 mcg by mouth daily after breakfast. ) 90 tablet 3  . cholestyramine (QUESTRAN) 4 GM/DOSE powder Take 1 packet (4 g total) by mouth 2 (two) times daily with a meal. (Patient not taking: Reported on 03/14/2020) 378 g 12   Current Facility-Administered Medications on File Prior to Visit  Medication Dose Route Frequency Provider Last Rate Last Admin  . sodium chloride flush (NS) 0.9 % injection 3 mL  3 mL Intravenous Q12H Josue Hector, MD       Review of Systems All otherwise neg per pt     Objective:   Physical Exam BP 126/74   Pulse 82   Temp 98.1 F (36.7 C)   Ht 5\' 1"  (1.549 m)   Wt 110 lb (49.9 kg)   SpO2 97%   BMI 20.78 kg/m  VS noted,  Constitutional: Pt appears in NAD HENT: Head: NCAT.  Right Ear: External ear normal.  Left Ear: External ear normal.  Eyes: . Pupils are equal, round, and reactive to light. Conjunctivae and EOM are normal Nose: without d/c or deformity Neck: Neck supple. Gross normal ROM Cardiovascular: Normal rate and regular rhythm.   Pulmonary/Chest: Effort normal and breath sounds without rales or wheezing.  Abd:  Soft, NT, ND, + BS, no organomegaly Neurological: Pt is alert. At baseline orientation, motor grossly intact Skin: Skin is warm. No rashes, other new lesions, no LE edema Psychiatric: Pt behavior is normal without agitation  All otherwise neg per pt Lab Results  Component Value Date   WBC 13.0 (H) 02/22/2020   HGB 13.9 02/22/2020   HCT 43.6 02/22/2020   PLT 265 02/22/2020   GLUCOSE 128 (H) 02/23/2020   CHOL 126 12/04/2019   TRIG 93 12/04/2019   HDL 62 12/04/2019   LDLDIRECT 102.4  05/24/2008   LDLCALC 47 12/04/2019   ALT 11 02/23/2020  AST 30 02/23/2020   NA 140 02/23/2020   K 3.4 (L) 02/23/2020   CL 100 02/23/2020   CREATININE 0.65 02/23/2020   BUN <5 (L) 02/23/2020   CO2 32 02/23/2020   TSH 0.499 04/08/2019   INR 0.9 09/10/2019   HGBA1C 5.2 10/01/2018   Declines further lab today    Assessment & Plan:

## 2020-03-14 NOTE — Patient Instructions (Signed)
Please continue all other medications as before, and refills have been done if requested.  Please have the pharmacy call with any other refills you may need.  Please continue your efforts at being more active, low cholesterol diet, and weight control.  You are otherwise up to date with prevention measures today.  Please keep your appointments with your specialists as you may have planned  No lab work is felt needed today  Please make an Appointment to return in 3 months, or sooner if needed

## 2020-03-14 NOTE — Assessment & Plan Note (Signed)

## 2020-03-20 ENCOUNTER — Encounter: Payer: Self-pay | Admitting: Internal Medicine

## 2020-03-20 ENCOUNTER — Ambulatory Visit: Payer: Medicare HMO | Admitting: Internal Medicine

## 2020-03-20 VITALS — BP 106/58 | HR 67 | Temp 98.4°F | Ht 61.0 in | Wt 112.0 lb

## 2020-03-20 DIAGNOSIS — K52832 Lymphocytic colitis: Secondary | ICD-10-CM | POA: Diagnosis not present

## 2020-03-20 DIAGNOSIS — Z86010 Personal history of colonic polyps: Secondary | ICD-10-CM

## 2020-03-20 DIAGNOSIS — R69 Illness, unspecified: Secondary | ICD-10-CM | POA: Diagnosis not present

## 2020-03-20 DIAGNOSIS — F419 Anxiety disorder, unspecified: Secondary | ICD-10-CM

## 2020-03-20 MED ORDER — PANTOPRAZOLE SODIUM 40 MG PO TBEC: 40.0000 mg | DELAYED_RELEASE_TABLET | Freq: Every day | ORAL | 1 refills | Status: DC

## 2020-03-20 MED ORDER — ALPRAZOLAM 0.25 MG PO TABS: 0.2500 mg | ORAL_TABLET | Freq: Three times a day (TID) | ORAL | 1 refills | Status: DC | PRN

## 2020-03-20 NOTE — Patient Instructions (Signed)
Stop your Budesonide per Dr Carlean Purl.   We have sent the following medications to your pharmacy for you to pick up at your convenience: Xanax to use as needed   Call us back with any diarrhea issues you develop.    I appreciate the opportunity to care for you. Silvano Rusk, MD, [facility]

## 2020-03-20 NOTE — Assessment & Plan Note (Addendum)
She had a great response to budesonide so we will stop it and see which clinical course she follows.  She may never require treatment again, she might need intermittent treatment or she might need chronic therapy.  She knows to call me or contact me when she has or if she has recurrent diarrhea problems and we will decide what to do.  I did explain that we always try to avoid using chronic steroids in people but this is the treatment for what she has.  This is in relation to her spine physician's recommendation to avoid steroids. Note that previous celiac testing has been negative.  Sertraline and PPI are risk factors for microscopic colitis but I do not think it makes sense to change her medication regimen at this time although she does not need twice daily PPI anymore and I told her she could go to daily.

## 2020-03-20 NOTE — Assessment & Plan Note (Signed)
I did refill her low-dose alprazolam No. 30 with 1 refill

## 2020-03-20 NOTE — Progress Notes (Signed)
DEYANEIRA BAUMHOVER 69 y.o. May 12, 1951 [id number]  Assessment & Plan:  Anxiety I did refill her low-dose alprazolam No. 30 with 1 refill  Hx of adenomatous polyp of colon She had another small adenoma and I placed a recall for 7 years but overlooked that she has a family history of colon cancer so we will change that for a 5-year recall  Lymphocytic colitis She had a great response to budesonide so we will stop it and see which clinical course she follows.  She may never require treatment again, she might need intermittent treatment or she might need chronic therapy.  She knows to call me or contact me when she has or if she has recurrent diarrhea problems and we will decide what to do.  I did explain that we always try to avoid using chronic steroids in people but this is the treatment for what she has.  This is in relation to her spine physician's recommendation to avoid steroids. Note that previous celiac testing has been negative.  Sertraline and PPI are risk factors for microscopic colitis but I do not think it makes sense to change her medication regimen at this time although she does not need twice daily PPI anymore and I told her she could go to daily.  CC: Biagio Borg, MD     Subjective:   Chief Complaint: Follow-up of microscopic colitis (lymphocytic)  HPI [name] is here for follow-up of recently diagnosed lymphocytic colitis and January or February, soon after starting budesonide she had significant improvement and is now having normal bowel movements and is very pleased.  She says Dr. Patrice Paradise her spine physician said she should not use steroids.  She says she uses 0.25 mg alprazolam sparingly throughout the year for anxiety and in the past her other physicians had provided this and she is asking if I would refill that for her. Allergies  Allergen Reactions  . Atorvastatin Nausea And Vomiting  . Levofloxacin Other (See Comments)    Reaction: achilles tendon pain  .  Omnicef [Cefdinir] Diarrhea    Uncontrollable diarrhea  . Trazodone And Nefazodone Other (See Comments)    "Felt like I was going to faint"  . Sulfa Antibiotics Rash  . Sulfonamide Derivatives Rash   Current Meds  Medication Sig  . ALPRAZolam (XANAX) 0.25 MG tablet Take 1 tablet (0.25 mg total) by mouth 3 (three) times daily as needed for anxiety.  . ARIPiprazole (ABILIFY) 2 MG tablet Take 1 tablet (2 mg total) by mouth daily.  . budesonide (ENTOCORT EC) 3 MG 24 hr capsule Take 3 capsules (9 mg total) by mouth daily.  . busPIRone (BUSPAR) 10 MG tablet Take 1 tablet (10 mg total) by mouth 3 (three) times daily.  . Cholecalciferol (VITAMIN D) 50 MCG (2000 UT) tablet Take 2,000 Units by mouth daily after breakfast.   . denosumab (PROLIA) 60 MG/ML SOSY injection Inject 60 mg into the skin every 6 (six) months.  . folic acid (FOLVITE) 1 MG tablet Take 1 tablet (1 mg total) by mouth daily. (Patient taking differently: Take 1 mg by mouth daily after breakfast. )  . furosemide (LASIX) 20 MG tablet Take 20 mg by mouth daily.  Marland Kitchen gabapentin (NEURONTIN) 300 MG capsule Take 300 mg by mouth 3 (three) times daily.  . hyoscyamine (ANASPAZ) 0.125 MG TBDP disintergrating tablet Place 0.125 mg under the tongue every 6 (six) hours as needed ([name] cramps).   Marland Kitchen ibuprofen (ADVIL) 800 MG tablet Take 1 tablet (800  mg total) by mouth every 8 (eight) hours as needed.  Marland Kitchen morphine (MS CONTIN) 60 MG 12 hr tablet Take 60 mg by mouth every 12 (twelve) hours.  . Multiple Vitamin (MULTIVITAMIN WITH MINERALS) TABS tablet Take 1 tablet by mouth daily.  . nicotine (NICODERM CQ - DOSED IN MG/24 HOURS) 21 mg/24hr patch Place 1 patch (21 mg total) onto the skin daily.  . Oxycodone HCl 10 MG TABS Take 10 mg by mouth 4 (four) times daily.   . OXYGEN Inhale 2 L/min into the lungs as needed.  . pantoprazole (PROTONIX) 40 MG tablet Take 1 tablet (40 mg total) by mouth 2 (two) times daily before a meal.  . potassium chloride (KLOR-CON)  10 MEQ tablet TAKE 1 TABLET ONCE DAILY. (Patient taking differently: Take 10 mEq by mouth daily. )  . rosuvastatin (CRESTOR) 5 MG tablet Take 1 tablet (5 mg total) by mouth every Monday, Wednesday, and Friday at 6 PM.  . sertraline (ZOLOFT) 100 MG tablet Take 2 tablets (200 mg total) by mouth at bedtime.  . valACYclovir (VALTREX) 500 MG tablet Take 500 mg by mouth 2 (two) times daily as needed (flare ups).  . vitamin B-12 (CYANOCOBALAMIN) 100 MCG tablet Take 1 tablet (100 mcg total) by mouth daily. (Patient taking differently: Take 100 mcg by mouth daily after breakfast. )  . [DISCONTINUED] ALPRAZolam (XANAX) 0.25 MG tablet Take 1 tablet (0.25 mg total) by mouth 3 (three) times daily as needed for anxiety.   Past Medical History:  Diagnosis Date  . Acute cystitis   . Acute respiratory failure ([place])   . Allergy    as a child grew out of them  . Anemia    pernicious anemia  . Anxiety   . Arthritis   . Back pain   . Blood transfusion   . CAP (community acquired pneumonia)   . CHF (congestive heart failure) ([place])   . Chronic pain syndrome   . Common bile duct stone   . Depression   . Diverticulosis of colon   . Duodenal ulcer hemorrhagic-after biliary sphincterotomy   . Dysthymia   . Esophagitis   . EtOH dependence ([place])   . Gastritis   . GERD (gastroesophageal reflux disease)   . H/O chest pain Dec. 2013   no work up done  . Hx of colonic polyps   . Hypertension   . Irritable bowel syndrome   . Lymphocytic colitis 02/16/2020  . Osteopenia   . Osteoporosis   . Pneumonia 2019  . Polypharmacy   . Reflux esophagitis   . Right sided sciatica 12/22/2017  . Tobacco use disorder   . Unspecified chronic bronchitis ([place])   . Vertigo   . Vitamin B12 deficiency   . Wears glasses    Past Surgical History:  Procedure Laterality Date  . APPENDECTOMY    . BACK SURGERY  10-09   Dr. Patrice Paradise  . BIOPSY  02/14/2020   Procedure: BIOPSY;  Surgeon: Gatha Mayer, MD;  Location: [facility]  END[facility];  Service: Endoscopy;;  . CARPAL TUNNEL RELEASE Right 08/14/2014   Procedure: RIGHT CARPAL TUNNEL RELEASE AND INJECT LEFT THUMB;  Surgeon: Daryll Brod, MD;  Location: [place];  Service: Orthopedics;  Laterality: Right;  . CHOLECYSTECTOMY N/A 02/22/2020   Procedure: LAPAROSCOPIC CHOLECYSTECTOMY WITH INTRAOPERATIVE CHOLANGIOGRAM;  Surgeon: Kieth Brightly Arta Bruce, MD;  [facility];  Service: General;  Laterality: N/A;  . COLONOSCOPY    . COLONOSCOPY WITH PROPOFOL N/A 02/14/2020   Procedure:  COLONOSCOPY WITH PROPOFOL;  Surgeon: Gatha Mayer, MD;  Location: [facility];  Service: Endoscopy;  Laterality: N/A;  . ENDOSCOPIC RETROGRADE CHOLANGIOPANCREATOGRAPHY (ERCP) WITH PROPOFOL N/A 12/25/2019   Procedure: ENDOSCOPIC RETROGRADE CHOLANGIOPANCREATOGRAPHY (ERCP) WITH PROPOFOL;  Surgeon: Gatha Mayer, MD;  Location: [place];  Service: Gastroenterology;  Laterality: N/A;  . ESOPHAGOGASTRODUODENOSCOPY (EGD) WITH PROPOFOL N/A 01/06/2020   Procedure: ESOPHAGOGASTRODUODENOSCOPY (EGD) WITH PROPOFOL;  Surgeon: Irene Shipper, MD;  Location: [facility] ENDOSCOPY;  Service: Endoscopy;  Laterality: N/A;  . HOT HEMOSTASIS N/A 01/06/2020   Procedure: HOT HEMOSTASIS (ARGON PLASMA COAGULATION/BICAP);  Surgeon: Irene Shipper, MD;  Location: [facility] ENDOSCOPY;  Service: Endoscopy;  Laterality: N/A;  . INTRAMEDULLARY (IM) NAIL INTERTROCHANTERIC Right 10/01/2018   Procedure: INTRAMEDULLARY (IM) NAIL INTERTROCHANTRIC;  Surgeon: Thornton Park, MD;  Location: [facility] ORS;  Service: Orthopedics;  Laterality: Right;  . LAPAROSCOPY N/A 02/21/2013   Procedure: LAPAROSCOPY OPERATIVE;  Surgeon: Margarette Asal, MD;  Location: [facility];  Service: Gynecology;  Laterality: N/A;  REQUESTING 5MM SCOPE WITH CAMERA  . LEFT HEART CATH AND CORONARY ANGIOGRAPHY N/A 05/11/2019   Procedure: LEFT HEART CATH AND CORONARY ANGIOGRAPHY;  Surgeon: Sherren Mocha, MD;  Location: [place] CV LAB;  Service: Cardiovascular;   Laterality: N/A;  . REMOVAL OF STONES  12/25/2019   Procedure: Karlyn Agee;  Surgeon: Gatha Mayer, MD;  Location: [facility] ENDOSCOPY;  Service: Gastroenterology;;  . Clide Deutscher  01/06/2020   Procedure: Clide Deutscher;  Surgeon: Irene Shipper, MD;  Location: [facility] ENDOSCOPY;  Service: Endoscopy;;  . Joan Mayans  12/25/2019   Procedure: SPHINCTEROTOMY;  Surgeon: Gatha Mayer, MD;  Location: [place];  Service: Gastroenterology;;  . TONSILLECTOMY    . TUBAL LIGATION    . UPPER GASTROINTESTINAL ENDOSCOPY     Social History   Social History Narrative   Married x 38 yrs. Has BS degree in Horticulture from [employer]. Currently resides in a house with her husband. 3 cats.  Fun: used to like to do a lot of things.    Denies religious beliefs that would effect health care.    lives with husband [name]; quit smoking- October 6th, 2019. ocasional alcohol. [employer] [2009] on disability sec to back issues.    family history includes Alcohol abuse in her daughter; Bipolar disorder in her daughter; Colon cancer in her brother and father; Drug abuse in her daughter; Heart disease in her mother; Prostate cancer in her father; Skin cancer in her sister.   Review of Systems As above  Objective:   Physical Exam BP (!) 106/58   Pulse 67   Temp 98.4 F (36.9 C)   Ht 5\' 1"  (1.549 m)   Wt 112 lb (50.8 kg)   BMI 21.16 kg/m

## 2020-03-20 NOTE — Assessment & Plan Note (Signed)
She had another small adenoma and I placed a recall for 7 years but overlooked that she has a family history of colon cancer so we will change that for a 5-year recall

## 2020-03-21 ENCOUNTER — Telehealth: Payer: Self-pay | Admitting: Internal Medicine

## 2020-03-21 NOTE — Telephone Encounter (Signed)
I spoke to [place] and the alprazolam is not violating her pain contract she said. I have informed [facility] and told them this was rx'ed for her anxiety.

## 2020-03-27 ENCOUNTER — Ambulatory Visit: Payer: Medicare HMO | Attending: Internal Medicine

## 2020-03-27 DIAGNOSIS — Z23 Encounter for immunization: Secondary | ICD-10-CM

## 2020-03-27 NOTE — Progress Notes (Signed)
   Covid-19 Vaccination Clinic  Name:  Denise King    MRN: [id number] DOB: [birth year]  03/27/2020  Denise King was observed post Covid-19 immunization for 15 minutes without incident. She was provided with Vaccine Information Sheet and instruction to access the V-Safe system.   Denise King was instructed to call 911 with any severe reactions post vaccine: Marland Kitchen Difficulty breathing  . Swelling of face and throat  . A fast heartbeat  . A bad rash all over body  . Dizziness and weakness   Immunizations Administered    Name Date Dose VIS Date Route   Pfizer COVID-19 Vaccine 03/27/2020 11:16 AM 0.3 mL 12/08/2019 Intramuscular   Manufacturer: [place]   Lot: [id number]   [place]: [id number]

## 2020-04-03 DIAGNOSIS — I509 Heart failure, unspecified: Secondary | ICD-10-CM | POA: Diagnosis not present

## 2020-04-09 ENCOUNTER — Other Ambulatory Visit: Payer: Self-pay | Admitting: *Deleted

## 2020-04-09 DIAGNOSIS — M961 Postlaminectomy syndrome, not elsewhere classified: Secondary | ICD-10-CM | POA: Diagnosis not present

## 2020-04-09 DIAGNOSIS — M25551 Pain in right hip: Secondary | ICD-10-CM | POA: Diagnosis not present

## 2020-04-09 DIAGNOSIS — Z79891 Long term (current) use of opiate analgesic: Secondary | ICD-10-CM | POA: Diagnosis not present

## 2020-04-09 DIAGNOSIS — G894 Chronic pain syndrome: Secondary | ICD-10-CM | POA: Diagnosis not present

## 2020-04-09 DIAGNOSIS — I872 Venous insufficiency (chronic) (peripheral): Secondary | ICD-10-CM

## 2020-04-09 DIAGNOSIS — I509 Heart failure, unspecified: Secondary | ICD-10-CM | POA: Diagnosis not present

## 2020-04-11 ENCOUNTER — Telehealth: Payer: Self-pay

## 2020-04-11 NOTE — Telephone Encounter (Signed)
Called patient to verify message, sent patient request to [place]

## 2020-04-11 NOTE — Telephone Encounter (Signed)
New message   The patient call needs a discontinue order fax to [phone]   Discontinue the oxygen order  Company name: [employer]   Phone # [phone]

## 2020-04-16 ENCOUNTER — Telehealth (INDEPENDENT_AMBULATORY_CARE_PROVIDER_SITE_OTHER): Payer: Medicare HMO | Admitting: Psychiatry

## 2020-04-16 ENCOUNTER — Ambulatory Visit (INDEPENDENT_AMBULATORY_CARE_PROVIDER_SITE_OTHER): Payer: Medicare HMO | Admitting: Vascular Surgery

## 2020-04-16 ENCOUNTER — Ambulatory Visit (HOSPITAL_COMMUNITY)
Admission: RE | Admit: 2020-04-16 | Discharge: 2020-04-16 | Disposition: A | Discharge status: Home / Self Care | Payer: Medicare HMO | Source: Ambulatory Visit | Attending: Vascular Surgery | Admitting: Vascular Surgery

## 2020-04-16 ENCOUNTER — Other Ambulatory Visit: Payer: Self-pay | Admitting: Vascular Surgery

## 2020-04-16 ENCOUNTER — Encounter: Payer: Self-pay | Admitting: Vascular Surgery

## 2020-04-16 ENCOUNTER — Other Ambulatory Visit: Payer: Self-pay

## 2020-04-16 VITALS — BP 111/67 | HR 81 | Temp 98.0°F | Resp 20 | Ht 61.0 in | Wt 112.8 lb

## 2020-04-16 DIAGNOSIS — I739 Peripheral vascular disease, unspecified: Secondary | ICD-10-CM | POA: Diagnosis not present

## 2020-04-16 DIAGNOSIS — F341 Dysthymic disorder: Secondary | ICD-10-CM | POA: Diagnosis not present

## 2020-04-16 DIAGNOSIS — F411 Generalized anxiety disorder: Secondary | ICD-10-CM | POA: Diagnosis not present

## 2020-04-16 DIAGNOSIS — G894 Chronic pain syndrome: Secondary | ICD-10-CM

## 2020-04-16 DIAGNOSIS — M25561 Pain in right knee: Secondary | ICD-10-CM | POA: Diagnosis not present

## 2020-04-16 DIAGNOSIS — R69 Illness, unspecified: Secondary | ICD-10-CM | POA: Diagnosis not present

## 2020-04-16 MED ORDER — SERTRALINE HCL 100 MG PO TABS: 200.0000 mg | ORAL_TABLET | Freq: Every day | ORAL | 0 refills | Status: DC

## 2020-04-16 MED ORDER — BUSPIRONE HCL 15 MG PO TABS: 15.0000 mg | ORAL_TABLET | Freq: Two times a day (BID) | ORAL | 0 refills | Status: DC

## 2020-04-16 MED ORDER — ARIPIPRAZOLE 2 MG PO TABS: 2.0000 mg | ORAL_TABLET | Freq: Every day | ORAL | 0 refills | Status: DC

## 2020-04-16 NOTE — Progress Notes (Signed)
History of Present Illness:    Denise King is a 69 y.o. female first seen 04/07/19  with hospitalization for acute Viral syndrome and URI symptoms and neg COVID lab test.  Resp panel neg.    She had no history of cardiac disease admitted with 48 hours dry cough and dyspnea then had nausea and vomiting Has had weeks of diarrhea ? Due to [name]. After vomiting had mild central chest pressure  She is a ppd smoker active over 40 years.  Troponin 1.07 WBC mildly elevated 10.7  Troponin remained flat and echo without RWMA.  She was on IV heparin for 48 hours and neg PE on CTA of chest but + CAD.  In mLAD.  Lipids were normal in hospital at 71    Cardiac CTA done 05/09/19 calcium score 1304 99 th percentile with positive FFR CT in RCA/LAD Cath 05/11/19 with only 50% mid RCA and 50% prox/mid LAD Medical Rx recommended   Had PT and got hematoma right thigh and knee Has held aspirin  No motivation to quit smoking   Hospitalized 12/26-1/11/21 had PNA with gallstones  Had ERCP 12/25/19 post procedure had hypercarbic hypoxemia and required bipap. Plans for outpatient laparoscopic GB surgery Then had black stool EGD done 01/06/20 ulcer at ampulla where sphincterotomy was done This was injected She continues to smoke   CXR showed bilateral effusion with subsegmental atelectasis BNP 893 She was d/c with lasix 20 mg daily  Previous echo 04/11/19 showed EF 60-65% with no significant valve disease    TTE 02/01/20 EF 60-65% mild LVH elevated EDP mild MR   Had definitive laparoscopic cholecystectomy on 02/22/20  Has some tightness in chest that does not sound anginal Worse when laying back and may be related To her recent surgery   Lots of stress at home Husband had a spinal abscess and complications with surgery in February and is "an invalid" now    The patient does not have symptoms concerning for COVID-19 infection (fever, chills, cough, or new shortness of breath).    Past Medical History:    Diagnosis Date  . Acute cystitis   . Acute respiratory failure ([place])   . Allergy    as a child grew out of them  . Anemia    pernicious anemia  . Anxiety   . Arthritis   . Back pain   . Blood transfusion   . CAP (community acquired pneumonia)   . CHF (congestive heart failure) ([place])   . Chronic pain syndrome   . Common bile duct stone   . Depression   . Diverticulosis of colon   . Duodenal ulcer hemorrhagic-after biliary sphincterotomy   . Dysthymia   . Esophagitis   . EtOH dependence ([place])   . Gastritis   . GERD (gastroesophageal reflux disease)   . H/O chest pain Dec. 2013   no work up done  . Hx of colonic polyps   . Hypertension   . Irritable bowel syndrome   . Lymphocytic colitis 02/16/2020  . Osteopenia   . Osteoporosis   . Pneumonia 2019  . Polypharmacy   . Reflux esophagitis   . Right sided sciatica 12/22/2017  . Tobacco use disorder   . Unspecified chronic bronchitis ([place])   . Vertigo   . Vitamin B12 deficiency   . Wears glasses    Past Surgical History:  Procedure Laterality Date  . APPENDECTOMY    . BACK SURGERY  10-09   Dr. Patrice Paradise  .  BIOPSY  02/14/2020   Procedure: BIOPSY;  Surgeon: Gatha Mayer, MD;  Location: [facility];  Service: Endoscopy;;  . CARPAL TUNNEL RELEASE Right 08/14/2014   Procedure: RIGHT CARPAL TUNNEL RELEASE AND INJECT LEFT THUMB;  Surgeon: Daryll Brod, MD;  Location: [facility];  Service: Orthopedics;  Laterality: Right;  . CHOLECYSTECTOMY N/A 02/22/2020   Procedure: LAPAROSCOPIC CHOLECYSTECTOMY WITH INTRAOPERATIVE CHOLANGIOGRAM;  Surgeon: Kieth Brightly Arta Bruce, MD;  Location: [facility];  Service: General;  Laterality: N/A;  . COLONOSCOPY    . COLONOSCOPY WITH PROPOFOL N/A 02/14/2020   Procedure: COLONOSCOPY WITH PROPOFOL;  Surgeon: Gatha Mayer, MD;  Location: [facility];  Service: Endoscopy;  Laterality: N/A;  . ENDOSCOPIC RETROGRADE CHOLANGIOPANCREATOGRAPHY (ERCP) WITH PROPOFOL N/A 12/25/2019   Procedure:  ENDOSCOPIC RETROGRADE CHOLANGIOPANCREATOGRAPHY (ERCP) WITH PROPOFOL;  Surgeon: Gatha Mayer, MD;  Location: [facility];  Service: Gastroenterology;  Laterality: N/A;  . ESOPHAGOGASTRODUODENOSCOPY (EGD) WITH PROPOFOL N/A 01/06/2020   Procedure: ESOPHAGOGASTRODUODENOSCOPY (EGD) WITH PROPOFOL;  Surgeon: Irene Shipper, MD;  Location: [facility] ENDOSCOPY;  Service: Endoscopy;  Laterality: N/A;  . HOT HEMOSTASIS N/A 01/06/2020   Procedure: HOT HEMOSTASIS (ARGON PLASMA COAGULATION/BICAP);  Surgeon: Irene Shipper, MD;  Location[facility];  Service: Endoscopy;  Laterality: N/A;  . INTRAMEDULLARY (IM) NAIL INTERTROCHANTERIC Right 10/01/2018   Procedure: INTRAMEDULLARY (IM) NAIL INTERTROCHANTRIC;  Surgeon: Thornton Park, MD;  Location: [facility];  Service: Orthopedics;  Laterality: Right;  . LAPAROSCOPY N/A 02/21/2013   Procedure: LAPAROSCOPY OPERATIVE;  Surgeon: Margarette Asal, MD;  Location: [facility];  Service: Gynecology;  Laterality: N/A;  REQUESTING 5MM SCOPE WITH CAMERA  . LEFT HEART CATH AND CORONARY ANGIOGRAPHY N/A 05/11/2019   Procedure: LEFT HEART CATH AND CORONARY ANGIOGRAPHY;  Surgeon: Sherren Mocha, MD;  Location: [facility];  Service: Cardiovascular;  Laterality: N/A;  . REMOVAL OF STONES  12/25/2019   Procedure: Karlyn Agee;  Surgeon: Gatha Mayer, MD;  Location: [facility] ENDOSCOPY;  Service: Gastroenterology;;  . Clide Deutscher  01/06/2020   Procedure: Clide Deutscher;  Surgeon: Irene Shipper, MD;  Location: [facility] ENDOSCOPY;  Service: Endoscopy;;  . Joan Mayans  12/25/2019   Procedure: SPHINCTEROTOMY;  Surgeon: Gatha Mayer, MD;  Location: [facility];  Service: Gastroenterology;;  . TONSILLECTOMY    . TUBAL LIGATION    . UPPER GASTROINTESTINAL ENDOSCOPY       Current Meds  Medication Sig  . ALPRAZolam (XANAX) 0.25 MG tablet Take 1 tablet (0.25 mg total) by mouth 3 (three) times daily as needed for anxiety.  Derrill Memo ON 05/17/2020] ARIPiprazole (ABILIFY) 2 MG tablet Take 1 tablet  (2 mg total) by mouth daily.  . budesonide (ENTOCORT EC) 3 MG 24 hr capsule Take 9 mg by mouth daily.  . busPIRone (BUSPAR) 15 MG tablet Take 1 tablet (15 mg total) by mouth 2 (two) times daily.  . Cholecalciferol (VITAMIN D) 50 MCG (2000 UT) tablet Take 2,000 Units by mouth daily after breakfast.   . denosumab (PROLIA) 60 MG/ML SOSY injection Inject 60 mg into the skin every 6 (six) months.  . folic acid (FOLVITE) 1 MG tablet Take 1 tablet (1 mg total) by mouth daily. (Patient taking differently: Take 1 mg by mouth daily after breakfast. )  . furosemide (LASIX) 20 MG tablet Take 20 mg by mouth daily.  Marland Kitchen gabapentin (NEURONTIN) 300 MG capsule Take 300 mg by mouth 3 (three) times daily.  . hyoscyamine (ANASPAZ) 0.125 MG TBDP disintergrating tablet Place 0.125 mg under the tongue every 6 (six)  hours as needed ([name] cramps).   Marland Kitchen ibuprofen (ADVIL) 800 MG tablet Take 1 tablet (800 mg total) by mouth every 8 (eight) hours as needed.  Marland Kitchen morphine (MS CONTIN) 60 MG 12 hr tablet Take 60 mg by mouth every 12 (twelve) hours.  . Multiple Vitamin (MULTIVITAMIN WITH MINERALS) TABS tablet Take 1 tablet by mouth daily.  . nicotine (NICODERM CQ - DOSED IN MG/24 HOURS) 21 mg/24hr patch Place 1 patch (21 mg total) onto the skin daily.  . Oxycodone HCl 10 MG TABS Take 10 mg by mouth 4 (four) times daily.   . OXYGEN Inhale 2 L/min into the lungs as needed.  . pantoprazole (PROTONIX) 40 MG tablet Take 1 tablet (40 mg total) by mouth daily before breakfast.  . potassium chloride (KLOR-CON) 10 MEQ tablet TAKE 1 TABLET ONCE DAILY.  Marland Kitchen potassium chloride SA (KLOR-CON) 20 MEQ tablet Take 20 mEq by mouth 2 (two) times daily.  . rosuvastatin (CRESTOR) 5 MG tablet Take 1 tablet (5 mg total) by mouth every Monday, Wednesday, and Friday at 6 PM.  . [START ON 05/17/2020] sertraline (ZOLOFT) 100 MG tablet Take 2 tablets (200 mg total) by mouth at bedtime.  . valACYclovir (VALTREX) 500 MG tablet Take 500 mg by mouth 2 (two) times daily  as needed (flare ups).  . vitamin B-12 (CYANOCOBALAMIN) 100 MCG tablet Take 1 tablet (100 mcg total) by mouth daily. (Patient taking differently: Take 100 mcg by mouth daily after breakfast. )     Allergies:   Atorvastatin, Levofloxacin, Omnicef [cefdinir], Trazodone and nefazodone, Sulfa antibiotics, and Sulfonamide derivatives   Social History   Tobacco Use  . Smoking status: Current Every Day Smoker    Packs/day: 1.00    Years: 30.00    Pack years: 30.00    Types: Cigarettes  . Smokeless tobacco: Never Used  Substance Use Topics  . Alcohol use: Yes    Alcohol/week: 3.0 standard drinks    Types: 3 Standard drinks or equivalent per week    Comment: occasional  . Drug use: No     Family Hx: The patient's family history includes Alcohol abuse in her daughter; Bipolar disorder in her daughter; Colon cancer in her brother and father; Drug abuse in her daughter; Heart disease in her mother; Prostate cancer in her father; Skin cancer in her sister. There is no history of Esophageal cancer, Rectal cancer, or Stomach cancer.  ROS:   Please see the history of present illness.    General:no colds or fevers, no weight changes Skin:no rashes or ulcers HEENT:no blurred vision, no congestion CV:see HPI PUL:see HPI GI:no diarrhea now but hx of [name].  no constipation or melena, no indigestion GU:no hematuria, no dysuria MS:no joint pain, no claudication Neuro:no syncope, no lightheadedness Endo:no diabetes, no thyroid disease hemoc followed by Hematology visit in July.     All other systems reviewed and are negative.   Prior CV studies:   The following studies were reviewed today:  Limited Echo 04/11/19 IMPRESSIONS    1. The left ventricle has normal systolic function, with an ejection fraction of 60-65%. The cavity size was normal. Left ventricular diastolic Doppler parameters are consistent with impaired relaxation.  2. The mitral valve is grossly normal.  3. The tricuspid  valve was grossly normal.  4. The aortic valve is tricuspid Aortic valve regurgitation was not assessed by color flow Doppler. No stenosis of the aortic valve.  5. Limited study for LV function; full doppler not performed; normal LV  function; mild diastolic dysfunction.  .Cath 05/11/19  Conclusion    Mid RCA lesion is 50% stenosed.  Prox Cx to Mid Cx lesion is 30% stenosed.  Prox LAD to Mid LAD lesion is 50% stenosed.  LV end diastolic pressure is low.   1.  Mild to moderate diffuse calcified stenosis of the proximal to mid LAD 2.  Widely patent left main and left circumflex with calcified nonobstructive disease 3.  Mild to moderate focal stenosis of the mid RCA, with lesion no greater than 50% does not appear flow-limiting 4.  Low LVEDP of 4 mmHg  Recommendations: Recommend medical therapy.  There is no target lesion for PCI.  The patient is a heavy smoker and tobacco cessation is recommended.        Labs/Other Tests and Data Reviewed:    EKG:   SR rate 75 normal 05/11/19   Recent Labs: 12/28/2019: B Natriuretic Peptide 893.7 12/30/2019: Magnesium 2.1 02/23/2020: ALT 11 04/22/2020: BUN 8; Creatinine, Ser 0.68; Hemoglobin 12.1; Platelets 255; Potassium 3.0; Sodium 140   Recent Lipid Panel Lab Results  Component Value Date/Time   CHOL 126 12/04/2019 11:17 AM   TRIG 93 12/04/2019 11:17 AM   HDL 62 12/04/2019 11:17 AM   CHOLHDL 2.0 12/04/2019 11:17 AM   CHOLHDL 2.7 04/09/2019 06:15 AM   LDLCALC 47 12/04/2019 11:17 AM   LDLDIRECT 102.4 05/24/2008 10:37 AM    Wt Readings from Last 3 Encounters:  04/22/20 113 lb (51.3 kg)  04/22/20 112 lb 6 oz (51 kg)  04/16/20 112 lb 12.8 oz (51.2 kg)     Objective:    Vital Signs:  BP (!) 116/58   Pulse 89   Ht 5\' 1"  (1.549 m)   Wt 113 lb (51.3 kg)   SpO2 93%   BMI 21.35 kg/m    Affect appropriate Healthy:  appears stated age HEENT: normal Neck supple with no adenopathy JVP normal no bruits no thyromegaly Lungs clear  with no wheezing and good diaphragmatic motion Heart:  S1/S2 no murmur, no rub, gallop or click PMI normal Abdomen: benighn, BS positve, no tenderness, no AAA no bruit.  No HSM or HJR post lap cholecystectomy  Distal pulses intact with no bruits No edema Neuro non-focal Skin warm and dry No muscular weakness    ASSESSMENT & PLAN:    1. CAD:  Moderate RCA/LAD by cath 05/11/19 continue ASA beta blocker  2. Chol:  Given high calcium score and moderate CAD continue crestor M/W/F LDL 47 12/04/19  3. GI:  Biliary colic now post definitive surgery improved  4. Smoking:  Counseled on cessation lung cancer screening CT per primary   5. GERD per PCP on protonix  6. Diastolic Dysfunction: K consistently low 3.0 now will d/c lasix/K start aldacone 25 mg daily   COVID-19 Education: The signs and symptoms of COVID-19 were discussed with the patient and how to seek care for testing (follow up with PCP or arrange E-visit).  The importance of social distancing was discussed today.     Medication Adjustments/Labs and Tests Ordered: Current medicines are reviewed at length with the patient today.  Concerns regarding medicines are outlined above.   Tests Ordered:  BMET/Lipid/Liver 3 weeks   Medication Changes:  D/c lasix and Kdur Start Aldactone 12.5 daily   Disposition:  Follow up in 3 months    Signed, Jenkins Rouge, MD  04/22/2020 3:38 PM    [place]

## 2020-04-16 NOTE — Progress Notes (Signed)
BH MD/PA/NP OP Progress Note  04/16/2020 2:16 PM Denise King  MRN:  [id number] Interview was conducted by phone and I verified that I was speaking with the correct person using two identifiers. I discussed the limitations of evaluation and management by telemedicine and  the availability of in person appointments. Patient expressed understanding and agreed to proceed.  Chief Complaint: Anxiety.  HPI:  69yo female with chronic anxiety/depression which worsened with increased physical problems/pain. She has been for a long time on Cymbalta - we have cross tapered it to sertraline which she toleratedwell and dose has been increased to 150 mg as she continued to reports feeling anxious and depressed.She was taken off clonazepam out of concern about combining benzodiazepines with narcotic analgesics and instead buspirone was added. She finds itnot very effective but continues to take it. It is prescribed tid but Denise King takes it bid. We have added a low dose of aripiprazole to augment sertraline and Denise King reports that her mood started to improve. While in the hospital in December she was taken off hydroxyzine - she takes melatonin now and her sleep is adequate. Dr. Carlean King recently restarted alprazolam low dose as needed for anxiety - she anticipates that her anxiety may increase as her daughter (with bipolar disorder) may return home next month. She is doing well and taking meds at this time but Denise King is worried how she will behave once she is back home.   Visit Diagnosis:    ICD-10-CM   1. Generalized anxiety disorder  F41.1   2. DYSTHYMIA  F34.1     Past Psychiatric History: Please see intake H&P.  Past Medical History:  Past Medical History:  Diagnosis Date  . Acute cystitis   . Acute respiratory failure ([place])   . Allergy    as a child grew out of them  . Anemia    pernicious anemia  . Anxiety   . Arthritis   . Back pain   . Blood transfusion   . CAP (community acquired  pneumonia)   . CHF (congestive heart failure) ([place])   . Chronic pain syndrome   . Common bile duct stone   . Depression   . Diverticulosis of colon   . Duodenal ulcer hemorrhagic-after biliary sphincterotomy   . Dysthymia   . Esophagitis   . EtOH dependence ([place])   . Gastritis   . GERD (gastroesophageal reflux disease)   . H/O chest pain Dec. 2013   no work up done  . Hx of colonic polyps   . Hypertension   . Irritable bowel syndrome   . Lymphocytic colitis 02/16/2020  . Osteopenia   . Osteoporosis   . Pneumonia 2019  . Polypharmacy   . Reflux esophagitis   . Right sided sciatica 12/22/2017  . Tobacco use disorder   . Unspecified chronic bronchitis ([place])   . Vertigo   . Vitamin B12 deficiency   . Wears glasses     Past Surgical History:  Procedure Laterality Date  . APPENDECTOMY    . BACK SURGERY  10-09   Dr. Patrice Paradise  . BIOPSY  02/14/2020   Procedure: BIOPSY;  Surgeon: Gatha Mayer, MD;  Location: [facility];  Service: Endoscopy;;  . CARPAL TUNNEL RELEASE Right 08/14/2014   Procedure: RIGHT CARPAL TUNNEL RELEASE AND INJECT LEFT THUMB;  Surgeon: Daryll Brod, MD;  Location: [facility];  Service: Orthopedics;  Laterality: Right;  . CHOLECYSTECTOMY N/A 02/22/2020   Procedure: LAPAROSCOPIC CHOLECYSTECTOMY WITH INTRAOPERATIVE CHOLANGIOGRAM;  Surgeon:  Kinsinger, Arta Bruce, MD;  Location: [facility];  Service: General;  Laterality: N/A;  . COLONOSCOPY    . COLONOSCOPY WITH PROPOFOL N/A 02/14/2020   Procedure: COLONOSCOPY WITH PROPOFOL;  Surgeon: Gatha Mayer, MD;  Location: [facility];  Service: Endoscopy;  Laterality: N/A;  . ENDOSCOPIC RETROGRADE CHOLANGIOPANCREATOGRAPHY (ERCP) WITH PROPOFOL N/A 12/25/2019   Procedure: ENDOSCOPIC RETROGRADE CHOLANGIOPANCREATOGRAPHY (ERCP) WITH PROPOFOL;  Surgeon: Gatha Mayer, MD;  Location: [facility];  Service: Gastroenterology;  Laterality: N/A;  . ESOPHAGOGASTRODUODENOSCOPY (EGD) WITH PROPOFOL N/A 01/06/2020   Procedure:  ESOPHAGOGASTRODUODENOSCOPY (EGD) WITH PROPOFOL;  Surgeon: Irene Shipper, MD;  Location: [facility];  Service: Endoscopy;  Laterality: N/A;  . HOT HEMOSTASIS N/A 01/06/2020   Procedure: HOT HEMOSTASIS (ARGON PLASMA COAGULATION/BICAP);  Surgeon: Irene Shipper, MD;  Location[facility] END[facility];  Service: Endoscopy;  Laterality: N/A;  . INTRAMEDULLARY (IM) NAIL INTERTROCHANTERIC Right 10/01/2018   Procedure: INTRAMEDULLARY (IM) NAIL INTERTROCHANTRIC;  Surgeon: Thornton Park, MD;  Location: [facility] ORS;  Service: Orthopedics;  Laterality: Right;  . LAPAROSCOPY N/A 02/21/2013   Procedure: LAPAROSCOPY OPERATIVE;  Surgeon: Margarette Asal, MD;  Location: [facility];  Service: Gynecology;  Laterality: N/A;  REQUESTING 5MM SCOPE WITH CAMERA  . LEFT HEART CATH AND CORONARY ANGIOGRAPHY N/A 05/11/2019   Procedure: LEFT HEART CATH AND CORONARY ANGIOGRAPHY;  Surgeon: Sherren Mocha, MD;  Location: [facility];  Service: Cardiovascular;  Laterality: N/A;  . REMOVAL OF STONES  12/25/2019   Procedure: Karlyn Agee;  Surgeon: Gatha Mayer, MD;  Location: [facility] ENDOSCOPY;  Service: Gastroenterology;;  . Clide Deutscher  01/06/2020   Procedure: Clide Deutscher;  Surgeon: Irene Shipper, MD;  Location: [facility] ENDOSCOPY;  Service: Endoscopy;;  . Joan Mayans  12/25/2019   Procedure: SPHINCTEROTOMY;  Surgeon: Gatha Mayer, MD;  Location: [facility];  Service: Gastroenterology;;  . TONSILLECTOMY    . TUBAL LIGATION    . UPPER GASTROINTESTINAL ENDOSCOPY      Family Psychiatric History: Reviewed.  Family History:  Family History  Problem Relation Age of Onset  . Colon cancer Father   . Prostate cancer Father   . Heart disease Mother        prev MVR, also has DJD  . Colon cancer Brother   . Skin cancer Sister   . Alcohol abuse Daughter   . Bipolar disorder Daughter   . Drug abuse Daughter   . Esophageal cancer Neg Hx   . Rectal cancer Neg Hx   . Stomach cancer Neg Hx     Social History:  Social History    Socioeconomic History  . Marital status: Married    Spouse name: Not on file  . Number of children: 4  . Years of education: 69  . Highest education level: Not on file  Occupational History  . Occupation: retired    Comment: disablility  Tobacco Use  . Smoking status: Current Every Day Smoker    Packs/day: 1.00    Years: 30.00    Pack years: 30.00    Types: Cigarettes  . Smokeless tobacco: Never Used  Substance and Sexual Activity  . Alcohol use: Yes    Alcohol/week: 3.0 standard drinks    Types: 3 Standard drinks or equivalent per week    Comment: occasional  . Drug use: No  . Sexual activity: Yes  Other Topics Concern  . Not on file  Social History Narrative   Married x 38 yrs. Has BS degree in Horticulture from [employer]. Currently resides in a house with  her husband. 3 cats.  Fun: used to like to do a lot of things.    Denies religious beliefs that would effect health care.    lives with husband [name]; quit smoking- October 6th, 2019. ocasional alcohol. [employer] [2009] on disability sec to back issues.    Social Determinants of Health   Financial Resource Strain:   . Difficulty of Paying Living Expenses:   Food Insecurity:   . Worried About Charity fundraiser in the Last Year:   . Arboriculturist in the Last Year:   Transportation Needs:   . Film/video editor (Medical):   Marland Kitchen Lack of Transportation (Non-Medical):   Physical Activity:   . Days of Exercise per Week:   . Minutes of Exercise per Session:   Stress:   . Feeling of Stress :   Social Connections:   . Frequency of Communication with Friends and Family:   . Frequency of Social Gatherings with Friends and Family:   . Attends Religious Services:   . Active Member of Clubs or Organizations:   . Attends Archivist Meetings:   Marland Kitchen Marital Status:     Allergies:  Allergies  Allergen Reactions  . Atorvastatin Nausea And Vomiting  . Levofloxacin Other (See Comments)     Reaction: achilles tendon pain  . Omnicef [Cefdinir] Diarrhea    Uncontrollable diarrhea  . Trazodone And Nefazodone Other (See Comments)    "Felt like I was going to faint"  . Sulfa Antibiotics Rash  . Sulfonamide Derivatives Rash    Metabolic Disorder Labs: Lab Results  Component Value Date   HGBA1C 5.2 10/01/2018   MPG 103 10/01/2018   No results found for: PROLACTIN Lab Results  Component Value Date   CHOL 126 12/04/2019   TRIG 93 12/04/2019   HDL 62 12/04/2019   CHOLHDL 2.0 12/04/2019   VLDL 10 04/09/2019   LDLCALC 47 12/04/2019   LDLCALC 71 04/09/2019   Lab Results  Component Value Date   TSH 0.499 04/08/2019   TSH 1.108 10/01/2018    Therapeutic Level Labs: No results found for: LITHIUM No results found for: VALPROATE No components found for:  CBMZ  Current Medications: Current Outpatient Medications  Medication Sig Dispense Refill  . ALPRAZolam (XANAX) 0.25 MG tablet Take 1 tablet (0.25 mg total) by mouth 3 (three) times daily as needed for anxiety. 30 tablet 1  . [START ON 05/17/2020] ARIPiprazole (ABILIFY) 2 MG tablet Take 1 tablet (2 mg total) by mouth daily. 90 tablet 0  . budesonide (ENTOCORT EC) 3 MG 24 hr capsule Take 9 mg by mouth daily.    . busPIRone (BUSPAR) 15 MG tablet Take 1 tablet (15 mg total) by mouth 2 (two) times daily. 180 tablet 0  . Cholecalciferol (VITAMIN D) 50 MCG (2000 UT) tablet Take 2,000 Units by mouth daily after breakfast.     . denosumab (PROLIA) 60 MG/ML SOSY injection Inject 60 mg into the skin every 6 (six) months.    . folic acid (FOLVITE) 1 MG tablet Take 1 tablet (1 mg total) by mouth daily. (Patient taking differently: Take 1 mg by mouth daily after breakfast. ) 90 tablet 3  . furosemide (LASIX) 20 MG tablet Take 20 mg by mouth daily.    Marland Kitchen gabapentin (NEURONTIN) 300 MG capsule Take 300 mg by mouth 3 (three) times daily.    . hyoscyamine (ANASPAZ) 0.125 MG TBDP disintergrating tablet Place 0.125 mg under the tongue every 6  (six)  hours as needed ([name] cramps).     Marland Kitchen ibuprofen (ADVIL) 800 MG tablet Take 1 tablet (800 mg total) by mouth every 8 (eight) hours as needed. 30 tablet 0  . morphine (MS CONTIN) 60 MG 12 hr tablet Take 60 mg by mouth every 12 (twelve) hours.    . Multiple Vitamin (MULTIVITAMIN WITH MINERALS) TABS tablet Take 1 tablet by mouth daily. (Patient not taking: Reported on 04/16/2020) 30 tablet 0  . nicotine (NICODERM CQ - DOSED IN MG/24 HOURS) 21 mg/24hr patch Place 1 patch (21 mg total) onto the skin daily. (Patient not taking: Reported on 04/16/2020) 28 patch 0  . Oxycodone HCl 10 MG TABS Take 10 mg by mouth 4 (four) times daily.     . OXYGEN Inhale 2 L/min into the lungs as needed.    . pantoprazole (PROTONIX) 40 MG tablet Take 1 tablet (40 mg total) by mouth daily before breakfast. 60 tablet 1  . potassium chloride (KLOR-CON) 10 MEQ tablet TAKE 1 TABLET ONCE DAILY. (Patient not taking: No sig reported) 30 tablet 5  . potassium chloride SA (KLOR-CON) 20 MEQ tablet Take 20 mEq by mouth 2 (two) times daily.    . rosuvastatin (CRESTOR) 5 MG tablet Take 1 tablet (5 mg total) by mouth every Monday, Wednesday, and Friday at 6 PM. 36 tablet 3  . [START ON 05/17/2020] sertraline (ZOLOFT) 100 MG tablet Take 2 tablets (200 mg total) by mouth at bedtime. 180 tablet 0  . valACYclovir (VALTREX) 500 MG tablet Take 500 mg by mouth 2 (two) times daily as needed (flare ups).    . vitamin B-12 (CYANOCOBALAMIN) 100 MCG tablet Take 1 tablet (100 mcg total) by mouth daily. (Patient taking differently: Take 100 mcg by mouth daily after breakfast. ) 90 tablet 3   No current facility-administered medications for this visit.     Psychiatric Specialty Exam: Review of Systems  Musculoskeletal: Positive for back pain.  Psychiatric/Behavioral: The patient is nervous/anxious.   All other systems reviewed and are negative.   There were no vitals taken for this visit.There is no height or weight on file to calculate BMI.   General Appearance: NA  Eye Contact:  NA  Speech:  Clear and Coherent and Normal Rate  Volume:  Normal  Mood:  Anxious  Affect:  NA  Thought Process:  Goal Directed and Linear  Orientation:  Full (Time, Place, and Person)  Thought Content: Logical   Suicidal Thoughts:  No  Homicidal Thoughts:  No  Memory:  Immediate;   Good Recent;   Good Remote;   Good  Judgement:  Good  Insight:  Good  Psychomotor Activity:  NA  Concentration:  Concentration: Good  Recall:  Good  Fund of Knowledge: Good  Language: Good  Akathisia:  Negative  Handed:  Right  AIMS (if indicated): not done  Assets:  Communication Skills Desire for Improvement Financial Resources/Insurance Housing Resilience  ADL's:  Intact  Cognition: WNL  Sleep:  Fair   Screenings: PHQ2-9     Office Visit from 03/14/2020 in [place] at [facility] Visit from 09/11/2019 in [place] Office Visit from 10/25/2015 in Primary Care at [facility] Total Score  1  1  3   PHQ-9 Total Score  --  --  18       Assessment and Plan: 69yo female with chronic anxiety/depression which worsened with increased physical problems/pain. She has been for a long time on Cymbalta - we have  cross tapered it to sertraline which she toleratedwell and dose has been increased to 150 mg as she continued to reports feeling anxious and depressed.She was taken off clonazepam out of concern about combining benzodiazepines with narcotic analgesics and instead buspirone was added. She finds itnot very effective but continues to take it. It is prescribed tid but Denise King takes it bid. We have added a low dose of aripiprazole to augment sertraline and Denise King reports that her mood started to improve. While in the hospital in December she was taken off hydroxyzine - she takes melatonin now and her sleep is adequate. Dr. Carlean King recently restarted alprazolam low dose as needed for anxiety - she anticipates that her  anxiety may increase as her daughter (with bipolar disorder) may return home next month. She is doing well and taking meds at this time but Denise King is worried how she will behave once she is back home. Denise King is taking care of her husband who has several medical problems.   [id number] disorder; GAD; Chronic pain disorder  Plan:ContinueZoloftto 200 mg at HS, buspirone(change to 15 mg bid) andAbilify 2 mg in AM. Next appointment in2 months. The plan was discussed with patient who had an opportunity to ask questions and these were all answered. I spend20 minutes inphone consultation with the patient.    Stephanie Acre, MD 04/16/2020, 2:16 PM

## 2020-04-16 NOTE — Progress Notes (Signed)
Vascular and Vein Specialist of   Patient name: Denise King MRN: [id number] DOB: Jul 10, 1951 Sex: female  REASON FOR CONSULT: Discuss positive screen for peripheral vascular occlusive disease  HPI: Denise King is a 69 y.o. female, who is here today for discussion of positive screen for peripheral vascular occlusive disease.  Her only lower extremity symptoms are of pain in her right thigh related to a right femur fracture in October 2019.  She also has bilateral sciatic discomfort.  She was admitted with congestive heart failure several months ago to the hospital and has been treated with diuretic therapy.  She has no history of lower extremity tissue loss.  She had a screen from insurance company suggesting possible peripheral vascular occlusive disease  Past Medical History:  Diagnosis Date  . Acute cystitis   . Acute respiratory failure ([place])   . Allergy    as a child grew out of them  . Anemia    pernicious anemia  . Anxiety   . Arthritis   . Back pain   . Blood transfusion   . CAP (community acquired pneumonia)   . CHF (congestive heart failure) ([place])   . Chronic pain syndrome   . Common bile duct stone   . Depression   . Diverticulosis of colon   . Duodenal ulcer hemorrhagic-after biliary sphincterotomy   . Dysthymia   . Esophagitis   . EtOH dependence ([place])   . Gastritis   . GERD (gastroesophageal reflux disease)   . H/O chest pain Dec. 2013   no work up done  . Hx of colonic polyps   . Hypertension   . Irritable bowel syndrome   . Lymphocytic colitis 02/16/2020  . Osteopenia   . Osteoporosis   . Pneumonia 2019  . Polypharmacy   . Reflux esophagitis   . Right sided sciatica 12/22/2017  . Tobacco use disorder   . Unspecified chronic bronchitis ([place])   . Vertigo   . Vitamin B12 deficiency   . Wears glasses     Family History  Problem Relation Age of Onset  . Colon cancer Father   . Prostate cancer  Father   . Heart disease Mother        prev MVR, also has DJD  . Colon cancer Brother   . Skin cancer Sister   . Alcohol abuse Daughter   . Bipolar disorder Daughter   . Drug abuse Daughter   . Esophageal cancer Neg Hx   . Rectal cancer Neg Hx   . Stomach cancer Neg Hx     SOCIAL HISTORY: Social History   Socioeconomic History  . Marital status: Married    Spouse name: Not on file  . Number of children: 4  . Years of education: 19  . Highest education level: Not on file  Occupational History  . Occupation: retired    Comment: disablility  Tobacco Use  . Smoking status: Current Every Day Smoker    Packs/day: 1.00    Years: 30.00    Pack years: 30.00    Types: Cigarettes  . Smokeless tobacco: Never Used  Substance and Sexual Activity  . Alcohol use: Yes    Alcohol/week: 3.0 standard drinks    Types: 3 Standard drinks or equivalent per week    Comment: occasional  . Drug use: No  . Sexual activity: Yes  Other Topics Concern  . Not on file  Social History Narrative   Married x 38 yrs. Has BS degree  in [place] from [place]. Currently resides in a house with her husband. 3 cats.  Fun: used to like to do a lot of things.    Denies religious beliefs that would effect health care.    lives with husband [name]; quit smoking- October 6th, 2019. ocasional alcohol. [employer] [2009] on disability sec to back issues.    Social Determinants of Health   Financial Resource Strain:   . Difficulty of Paying Living Expenses:   Food Insecurity:   . Worried About Charity fundraiser in the Last Year:   . Arboriculturist in the Last Year:   Transportation Needs:   . Film/video editor (Medical):   Marland Kitchen Lack of Transportation (Non-Medical):   Physical Activity:   . Days of Exercise per Week:   . Minutes of Exercise per Session:   Stress:   . Feeling of Stress :   Social Connections:   . Frequency of Communication with Friends and Family:   . Frequency of  Social Gatherings with Friends and Family:   . Attends Religious Services:   . Active Member of Clubs or Organizations:   . Attends Archivist Meetings:   Marland Kitchen Marital Status:   Intimate Partner Violence:   . Fear of Current or Ex-Partner:   . Emotionally Abused:   Marland Kitchen Physically Abused:   . Sexually Abused:     Allergies  Allergen Reactions  . Atorvastatin Nausea And Vomiting  . Levofloxacin Other (See Comments)    Reaction: achilles tendon pain  . Omnicef [Cefdinir] Diarrhea    Uncontrollable diarrhea  . Trazodone And Nefazodone Other (See Comments)    "Felt like I was going to faint"  . Sulfa Antibiotics Rash  . Sulfonamide Derivatives Rash    Current Outpatient Medications  Medication Sig Dispense Refill  . ALPRAZolam (XANAX) 0.25 MG tablet Take 1 tablet (0.25 mg total) by mouth 3 (three) times daily as needed for anxiety. 30 tablet 1  . ARIPiprazole (ABILIFY) 2 MG tablet Take 1 tablet (2 mg total) by mouth daily. 90 tablet 0  . budesonide (ENTOCORT EC) 3 MG 24 hr capsule Take 9 mg by mouth daily.    . busPIRone (BUSPAR) 10 MG tablet Take 1 tablet (10 mg total) by mouth 3 (three) times daily. 90 tablet 5  . Cholecalciferol (VITAMIN D) 50 MCG (2000 UT) tablet Take 2,000 Units by mouth daily after breakfast.     . denosumab (PROLIA) 60 MG/ML SOSY injection Inject 60 mg into the skin every 6 (six) months.    . folic acid (FOLVITE) 1 MG tablet Take 1 tablet (1 mg total) by mouth daily. (Patient taking differently: Take 1 mg by mouth daily after breakfast. ) 90 tablet 3  . furosemide (LASIX) 20 MG tablet Take 20 mg by mouth daily.    Marland Kitchen gabapentin (NEURONTIN) 300 MG capsule Take 300 mg by mouth 3 (three) times daily.    . hyoscyamine (ANASPAZ) 0.125 MG TBDP disintergrating tablet Place 0.125 mg under the tongue every 6 (six) hours as needed ([name] cramps).     Marland Kitchen ibuprofen (ADVIL) 800 MG tablet Take 1 tablet (800 mg total) by mouth every 8 (eight) hours as needed. 30 tablet 0  .  morphine (MS CONTIN) 60 MG 12 hr tablet Take 60 mg by mouth every 12 (twelve) hours.    . Oxycodone HCl 10 MG TABS Take 10 mg by mouth 4 (four) times daily.     . pantoprazole (PROTONIX) 40  MG tablet Take 1 tablet (40 mg total) by mouth daily before breakfast. 60 tablet 1  . potassium chloride SA (KLOR-CON) 20 MEQ tablet Take 20 mEq by mouth 2 (two) times daily.    . rosuvastatin (CRESTOR) 5 MG tablet Take 1 tablet (5 mg total) by mouth every Monday, Wednesday, and Friday at 6 PM. 36 tablet 3  . sertraline (ZOLOFT) 100 MG tablet Take 2 tablets (200 mg total) by mouth at bedtime. 180 tablet 0  . valACYclovir (VALTREX) 500 MG tablet Take 500 mg by mouth 2 (two) times daily as needed (flare ups).    . vitamin B-12 (CYANOCOBALAMIN) 100 MCG tablet Take 1 tablet (100 mcg total) by mouth daily. (Patient taking differently: Take 100 mcg by mouth daily after breakfast. ) 90 tablet 3  . Multiple Vitamin (MULTIVITAMIN WITH MINERALS) TABS tablet Take 1 tablet by mouth daily. (Patient not taking: Reported on 04/16/2020) 30 tablet 0  . nicotine (NICODERM CQ - DOSED IN MG/24 HOURS) 21 mg/24hr patch Place 1 patch (21 mg total) onto the skin daily. (Patient not taking: Reported on 04/16/2020) 28 patch 0  . OXYGEN Inhale 2 L/min into the lungs as needed.    . potassium chloride (KLOR-CON) 10 MEQ tablet TAKE 1 TABLET ONCE DAILY. (Patient not taking: No sig reported) 30 tablet 5   No current facility-administered medications for this visit.    REVIEW OF SYSTEMS:  [X]  denotes positive finding, [ ]  denotes negative finding Cardiac  Comments:  Chest pain or chest pressure: x   Shortness of breath upon exertion: x   Short of breath when lying flat:    Irregular heart rhythm:        Vascular    Pain in calf, thigh, or hip brought on by ambulation:    Pain in feet at night that wakes you up from your sleep:     Blood clot in your veins:    Leg swelling:  x       Pulmonary    Oxygen at home:    Productive cough:      Wheezing:         Neurologic    Sudden weakness in arms or legs:     Sudden numbness in arms or legs:     Sudden onset of difficulty speaking or slurred speech:    Temporary loss of vision in one eye:     Problems with dizziness:         Gastrointestinal    Blood in stool:     Vomited blood:         Genitourinary    Burning when urinating:     Blood in urine:        Psychiatric    Major depression:         Hematologic    Bleeding problems:    Problems with blood clotting too easily:        Skin    Rashes or ulcers:        Constitutional    Fever or chills:      PHYSICAL EXAM: Vitals:   04/16/20 1119  BP: 111/67  Pulse: 81  Resp: 20  Temp: 98 F (36.7 C)  SpO2: 91%  Weight: 112 lb 12.8 oz (51.2 kg)  Height: 5\' 1"  (1.549 m)    GENERAL: The patient is a well-nourished female, in no acute distress. The vital signs are documented above. CARDIOVASCULAR: 2+ radial and 2+ dorsalis pedis pulses bilaterally.  She has  no lower extremity swelling currently PULMONARY: There is good air exchange  ABDOMEN: Soft and non-tender  MUSCULOSKELETAL: There are no major deformities or cyanosis. NEUROLOGIC: No focal weakness or paresthesias are detected. SKIN: There are no ulcers or rashes noted. PSYCHIATRIC: The patient has a normal affect.  DATA:  Noninvasive studies in our office reveal normal ankle arm index and normal triphasic waveforms at the dorsalis pedis and posterior tibial level bilaterally  MEDICAL ISSUES: Discussed these findings at length with the patient.  She does not have any symptoms, physical exam or noninvasive studies to suggest peripheral vascular occlusive disease.  She was reassured with this discussion and will see [place] again on an as-needed basis   Rosetta Posner, MD [facility] Vascular and Vein Specialists of [facility] Tel [phone] Pager [phone]

## 2020-04-22 ENCOUNTER — Inpatient Hospital Stay: Payer: Medicare HMO | Attending: Internal Medicine

## 2020-04-22 ENCOUNTER — Inpatient Hospital Stay: Payer: Medicare HMO

## 2020-04-22 ENCOUNTER — Ambulatory Visit: Payer: Medicare HMO | Admitting: Cardiovascular Disease

## 2020-04-22 ENCOUNTER — Inpatient Hospital Stay (HOSPITAL_BASED_OUTPATIENT_CLINIC_OR_DEPARTMENT_OTHER): Payer: Medicare HMO | Admitting: Internal Medicine

## 2020-04-22 ENCOUNTER — Encounter: Payer: Self-pay | Admitting: Cardiovascular Disease

## 2020-04-22 ENCOUNTER — Other Ambulatory Visit: Payer: Self-pay

## 2020-04-22 VITALS — BP 135/72 | HR 85 | Temp 97.4°F | Wt 112.4 lb

## 2020-04-22 VITALS — BP 116/58 | HR 89 | Ht 61.0 in | Wt 113.0 lb

## 2020-04-22 DIAGNOSIS — Z800 Family history of malignant neoplasm of digestive organs: Secondary | ICD-10-CM | POA: Insufficient documentation

## 2020-04-22 DIAGNOSIS — Z8249 Family history of ischemic heart disease and other diseases of the circulatory system: Secondary | ICD-10-CM | POA: Diagnosis not present

## 2020-04-22 DIAGNOSIS — D751 Secondary polycythemia: Secondary | ICD-10-CM | POA: Diagnosis not present

## 2020-04-22 DIAGNOSIS — F17210 Nicotine dependence, cigarettes, uncomplicated: Secondary | ICD-10-CM | POA: Diagnosis not present

## 2020-04-22 DIAGNOSIS — R69 Illness, unspecified: Secondary | ICD-10-CM | POA: Diagnosis not present

## 2020-04-22 DIAGNOSIS — Z79899 Other long term (current) drug therapy: Secondary | ICD-10-CM | POA: Diagnosis not present

## 2020-04-22 DIAGNOSIS — E785 Hyperlipidemia, unspecified: Secondary | ICD-10-CM

## 2020-04-22 DIAGNOSIS — I2510 Atherosclerotic heart disease of native coronary artery without angina pectoris: Secondary | ICD-10-CM

## 2020-04-22 DIAGNOSIS — Z791 Long term (current) use of non-steroidal anti-inflammatories (NSAID): Secondary | ICD-10-CM | POA: Insufficient documentation

## 2020-04-22 DIAGNOSIS — Z8042 Family history of malignant neoplasm of prostate: Secondary | ICD-10-CM | POA: Diagnosis not present

## 2020-04-22 DIAGNOSIS — Z7952 Long term (current) use of systemic steroids: Secondary | ICD-10-CM | POA: Diagnosis not present

## 2020-04-22 LAB — BASIC METABOLIC PANEL
Anion gap: 11 (ref 5–15)
BUN: 8 mg/dL (ref 8–23)
CO2: 32 mmol/L (ref 22–32)
Calcium: 8.5 mg/dL — ABNORMAL LOW (ref 8.9–10.3)
Chloride: 97 mmol/L — ABNORMAL LOW (ref 98–111)
Creatinine, Ser: 0.68 mg/dL (ref 0.44–1.00)
GFR calc Af Amer: >60 mL/min (ref >60)
GFR calc non Af Amer: >60 mL/min (ref >60)
Glucose, Bld: 114 mg/dL — ABNORMAL HIGH (ref 70–99)
Potassium: 3 mmol/L — ABNORMAL LOW (ref 3.5–5.1)
Sodium: 140 mmol/L (ref 135–145)

## 2020-04-22 LAB — CBC WITH DIFFERENTIAL/PLATELET
Abs Immature Granulocytes: 0.03 10*3/uL (ref 0.00–0.07)
Basophils Absolute: 0 10*3/uL (ref 0.0–0.1)
Basophils Relative: 0 %
Eosinophils Absolute: 0.1 10*3/uL (ref 0.0–0.5)
Eosinophils Relative: 1 %
HCT: 37 % (ref 36.0–46.0)
Hemoglobin: 12.1 g/dL (ref 12.0–15.0)
Immature Granulocytes: 0 %
Lymphocytes Relative: 30 %
Lymphs Abs: 2.7 10*3/uL (ref 0.7–4.0)
MCH: 30.7 pg (ref 26.0–34.0)
MCHC: 32.7 g/dL (ref 30.0–36.0)
MCV: 93.9 fL (ref 80.0–100.0)
Monocytes Absolute: 0.8 10*3/uL (ref 0.1–1.0)
Monocytes Relative: 9 %
Neutro Abs: 5.2 10*3/uL (ref 1.7–7.7)
Neutrophils Relative %: 60 %
Platelets: 255 10*3/uL (ref 150–400)
RBC: 3.94 MIL/uL (ref 3.87–5.11)
RDW: 13.8 % (ref 11.5–15.5)
WBC: 8.9 10*3/uL (ref 4.0–10.5)
nRBC: 0 % (ref 0.0–0.2)

## 2020-04-22 LAB — LACTATE DEHYDROGENASE: LDH: 132 U/L (ref 98–192)

## 2020-04-22 MED ORDER — ASPIRIN EC 81 MG PO TBEC: 81.0000 mg | DELAYED_RELEASE_TABLET | Freq: Every day | ORAL | Status: DC

## 2020-04-22 MED ORDER — SPIRONOLACTONE 25 MG PO TABS: 12.5000 mg | ORAL_TABLET | Freq: Every day | ORAL | 3 refills | Status: DC

## 2020-04-22 MED ORDER — NITROGLYCERIN 0.4 MG SL SUBL: 0.4000 mg | SUBLINGUAL_TABLET | SUBLINGUAL | 3 refills | Status: DC | PRN

## 2020-04-22 NOTE — Progress Notes (Signed)
[facility] NOTE  Patient Care Team: Biagio Borg, MD as PCP - General (Internal Medicine) Josue Hector, MD as PCP - Cardiology (Cardiology)  CHIEF COMPLAINTS/PURPOSE OF CONSULTATION: Erythrocytosis  #  ERYTHROCYTOSIS [name] since 2009]-October 2019-Jak 2/erythropoietin level-normal.   # Hx of Pernicious anemia  # Smoker-active smoker- declines LCSP  Oncology History   No history exists.     HISTORY OF PRESENTING ILLNESS:  Denise King 69 y.o.  female history of erythrocytosis thought to be secondary is here for follow-up.  In the interim patient had a gallbladder surgery.  Postoperatively recovering well.  No nausea no vomiting.  Headaches.  Chronic shortness of breath not any worse.  Review of Systems  Constitutional: Positive for malaise/fatigue. Negative for chills, diaphoresis, fever and weight loss.  HENT: Negative for nosebleeds and sore throat.   Eyes: Negative for double vision.  Respiratory: Positive for cough and shortness of breath. Negative for hemoptysis, sputum production and wheezing.   Cardiovascular: Negative for chest pain, palpitations, orthopnea and leg swelling.  Gastrointestinal: Negative for abdominal pain, blood in stool, constipation, diarrhea, heartburn, melena, nausea and vomiting.  Genitourinary: Negative for dysuria, frequency and urgency.  Musculoskeletal: Positive for back pain and joint pain.  Skin: Negative.  Negative for itching and rash.  Neurological: Negative for dizziness, tingling, focal weakness, weakness and headaches.  Endo/Heme/Allergies: Does not bruise/bleed easily.  Psychiatric/Behavioral: Negative for depression. The patient is not nervous/anxious and does not have insomnia.      MEDICAL HISTORY:  Past Medical History:  Diagnosis Date  . Acute cystitis   . Acute respiratory failure ([place])   . Allergy    as a child grew out of them  . Anemia    pernicious anemia  . Anxiety   .  Arthritis   . Back pain   . Blood transfusion   . CAP (community acquired pneumonia)   . CHF (congestive heart failure) ([place])   . Chronic pain syndrome   . Common bile duct stone   . Depression   . Diverticulosis of colon   . Duodenal ulcer hemorrhagic-after biliary sphincterotomy   . Dysthymia   . Esophagitis   . EtOH dependence ([place])   . Gastritis   . GERD (gastroesophageal reflux disease)   . H/O chest pain Dec. 2013   no work up done  . Hx of colonic polyps   . Hypertension   . Irritable bowel syndrome   . Lymphocytic colitis 02/16/2020  . Osteopenia   . Osteoporosis   . Pneumonia 2019  . Polypharmacy   . Reflux esophagitis   . Right sided sciatica 12/22/2017  . Tobacco use disorder   . Unspecified chronic bronchitis ([place])   . Vertigo   . Vitamin B12 deficiency   . Wears glasses     SURGICAL HISTORY: Past Surgical History:  Procedure Laterality Date  . APPENDECTOMY    . BACK SURGERY  10-09   Dr. Patrice Paradise  . BIOPSY  02/14/2020   Procedure: BIOPSY;  Surgeon: Gatha Mayer, MD;  Location: [facility];  Service: Endoscopy;;  . CARPAL TUNNEL RELEASE Right 08/14/2014   Procedure: RIGHT CARPAL TUNNEL RELEASE AND INJECT LEFT THUMB;  Surgeon: Daryll Brod, MD;  Location: [facility];  Service: Orthopedics;  Laterality: Right;  . CHOLECYSTECTOMY N/A 02/22/2020   Procedure: LAPAROSCOPIC CHOLECYSTECTOMY WITH INTRAOPERATIVE CHOLANGIOGRAM;  Surgeon: Kieth Brightly Arta Bruce, MD;  Location: [facility];  Service: General;  Laterality: N/A;  . COLONOSCOPY    .  COLONOSCOPY WITH PROPOFOL N/A 02/14/2020   Procedure: COLONOSCOPY WITH PROPOFOL;  Surgeon: Gatha Mayer, MD;  Location: [facility];  Service: Endoscopy;  Laterality: N/A;  . ENDOSCOPIC RETROGRADE CHOLANGIOPANCREATOGRAPHY (ERCP) WITH PROPOFOL N/A 12/25/2019   Procedure: ENDOSCOPIC RETROGRADE CHOLANGIOPANCREATOGRAPHY (ERCP) WITH PROPOFOL;  Surgeon: Gatha Mayer, MD;  Location: [facility];  Service: Gastroenterology;   Laterality: N/A;  . ESOPHAGOGASTRODUODENOSCOPY (EGD) WITH PROPOFOL N/A 01/06/2020   Procedure: ESOPHAGOGASTRODUODENOSCOPY (EGD) WITH PROPOFOL;  Surgeon: Irene Shipper, MD;  Location: [facility];  Service: Endoscopy;  Laterality: N/A;  . HOT HEMOSTASIS N/A 01/06/2020   Procedure: HOT HEMOSTASIS (ARGON PLASMA COAGULATION/BICAP);  Surgeon: Irene Shipper, MD;  Location: [facility] ENDOSCOPY;  Service: Endoscopy;  Laterality: N/A;  . INTRAMEDULLARY (IM) NAIL INTERTROCHANTERIC Right 10/01/2018   Procedure: INTRAMEDULLARY (IM) NAIL INTERTROCHANTRIC;  Surgeon: Thornton Park, MD;  Location: [facility];  Service: Orthopedics;  Laterality: Right;  . LAPAROSCOPY N/A 02/21/2013   Procedure: LAPAROSCOPY OPERATIVE;  Surgeon: Margarette Asal, MD;  Location: [facility];  Service: Gynecology;  Laterality: N/A;  REQUESTING 5MM SCOPE WITH CAMERA  . LEFT HEART CATH AND CORONARY ANGIOGRAPHY N/A 05/11/2019   Procedure: LEFT HEART CATH AND CORONARY ANGIOGRAPHY;  Surgeon: Sherren Mocha, MD;  Location: [facility];  Service: Cardiovascular;  Laterality: N/A;  . REMOVAL OF STONES  12/25/2019   Procedure: Karlyn Agee;  Surgeon: Gatha Mayer, MD;  Location: [facility] END[facility]COPY;  Service: Gastroenterology;;  . Clide Deutscher  01/06/2020   Procedure: Clide Deutscher;  Surgeon: Irene Shipper, MD;  Location: [facility] ENDOSCOPY;  Service: Endoscopy;;  . Joan Mayans  12/25/2019   Procedure: SPHINCTEROTOMY;  Surgeon: Gatha Mayer, MD;  Location: [facility];  Service: Gastroenterology;;  . TONSILLECTOMY    . TUBAL LIGATION    . UPPER GASTROINTESTINAL ENDOSCOPY      SOCIAL HISTORY: Social History   Socioeconomic History  . Marital status: Married    Spouse name: Not on file  . Number of children: 4  . Years of education: 24  . Highest education level: Not on file  Occupational History  . Occupation: retired    Comment: disablility  Tobacco Use  . Smoking status: Current Every Day Smoker    Packs/day: 1.00    Years: 30.00     Pack years: 30.00    Types: Cigarettes  . Smokeless tobacco: Never Used  Substance and Sexual Activity  . Alcohol use: Yes    Alcohol/week: 3.0 standard drinks    Types: 3 Standard drinks or equivalent per week    Comment: occasional  . Drug use: No  . Sexual activity: Yes  Other Topics Concern  . Not on file  Social History Narrative   Married x 38 yrs. Has BS degree in Horticulture from [employer]. Currently resides in a house with her husband. 3 cats.  Fun: used to like to do a lot of things.    Denies religious beliefs that would effect health care.    lives with husband [name]; quit smoking- October 6th, 2019. ocasional alcohol. [employer] [2009] on disability sec to back issues.    Social Determinants of Health   Financial Resource Strain:   . Difficulty of Paying Living Expenses:   Food Insecurity:   . Worried About Charity fundraiser in the Last Year:   . Arboriculturist in the Last Year:   Transportation Needs:   . Film/video editor (Medical):   Marland Kitchen Lack of Transportation (Non-Medical):   Physical Activity:   .  Days of Exercise per Week:   . Minutes of Exercise per Session:   Stress:   . Feeling of Stress :   Social Connections:   . Frequency of Communication with Friends and Family:   . Frequency of Social Gatherings with Friends and Family:   . Attends Religious Services:   . Active Member of Clubs or Organizations:   . Attends Archivist Meetings:   Marland Kitchen Marital Status:   Intimate Partner Violence:   . Fear of Current or Ex-Partner:   . Emotionally Abused:   Marland Kitchen Physically Abused:   . Sexually Abused:     FAMILY HISTORY: Family History  Problem Relation Age of Onset  . Colon cancer Father   . Prostate cancer Father   . Heart disease Mother        prev MVR, also has DJD  . Colon cancer Brother   . Skin cancer Sister   . Alcohol abuse Daughter   . Bipolar disorder Daughter   . Drug abuse Daughter   . Esophageal cancer Neg Hx    . Rectal cancer Neg Hx   . Stomach cancer Neg Hx     ALLERGIES:  is allergic to atorvastatin; levofloxacin; omnicef [cefdinir]; trazodone and nefazodone; sulfa antibiotics; and sulfonamide derivatives.  MEDICATIONS:  Current Outpatient Medications  Medication Sig Dispense Refill  . ALPRAZolam (XANAX) 0.25 MG tablet Take 1 tablet (0.25 mg total) by mouth 3 (three) times daily as needed for anxiety. 30 tablet 1  . [START ON 05/17/2020] ARIPiprazole (ABILIFY) 2 MG tablet Take 1 tablet (2 mg total) by mouth daily. 90 tablet 0  . budesonide (ENTOCORT EC) 3 MG 24 hr capsule Take 9 mg by mouth daily.    . busPIRone (BUSPAR) 15 MG tablet Take 1 tablet (15 mg total) by mouth 2 (two) times daily. 180 tablet 0  . Cholecalciferol (VITAMIN D) 50 MCG (2000 UT) tablet Take 2,000 Units by mouth daily after breakfast.     . denosumab (PROLIA) 60 MG/ML SOSY injection Inject 60 mg into the skin every 6 (six) months.    . folic acid (FOLVITE) 1 MG tablet Take 1 tablet (1 mg total) by mouth daily. (Patient taking differently: Take 1 mg by mouth daily after breakfast. ) 90 tablet 3  . furosemide (LASIX) 20 MG tablet Take 20 mg by mouth daily.    Marland Kitchen gabapentin (NEURONTIN) 300 MG capsule Take 300 mg by mouth 3 (three) times daily.    . hyoscyamine (ANASPAZ) 0.125 MG TBDP disintergrating tablet Place 0.125 mg under the tongue every 6 (six) hours as needed ([name] cramps).     Marland Kitchen ibuprofen (ADVIL) 800 MG tablet Take 1 tablet (800 mg total) by mouth every 8 (eight) hours as needed. 30 tablet 0  . morphine (MS CONTIN) 60 MG 12 hr tablet Take 60 mg by mouth every 12 (twelve) hours.    . Multiple Vitamin (MULTIVITAMIN WITH MINERALS) TABS tablet Take 1 tablet by mouth daily. 30 tablet 0  . nicotine (NICODERM CQ - DOSED IN MG/24 HOURS) 21 mg/24hr patch Place 1 patch (21 mg total) onto the skin daily. 28 patch 0  . Oxycodone HCl 10 MG TABS Take 10 mg by mouth 4 (four) times daily.     . OXYGEN Inhale 2 L/min into the lungs as  needed.    . pantoprazole (PROTONIX) 40 MG tablet Take 1 tablet (40 mg total) by mouth daily before breakfast. 60 tablet 1  . potassium chloride (KLOR-CON) 10 MEQ  tablet TAKE 1 TABLET ONCE DAILY. 30 tablet 5  . potassium chloride SA (KLOR-CON) 20 MEQ tablet Take 20 mEq by mouth 2 (two) times daily.    . rosuvastatin (CRESTOR) 5 MG tablet Take 1 tablet (5 mg total) by mouth every Monday, Wednesday, and Friday at 6 PM. 36 tablet 3  . [START ON 05/17/2020] sertraline (ZOLOFT) 100 MG tablet Take 2 tablets (200 mg total) by mouth at bedtime. 180 tablet 0  . valACYclovir (VALTREX) 500 MG tablet Take 500 mg by mouth 2 (two) times daily as needed (flare ups).    . vitamin B-12 (CYANOCOBALAMIN) 100 MCG tablet Take 1 tablet (100 mcg total) by mouth daily. (Patient taking differently: Take 100 mcg by mouth daily after breakfast. ) 90 tablet 3   No current facility-administered medications for this visit.      Marland Kitchen  PHYSICAL EXAMINATION:  Vitals:   04/22/20 1343  BP: 135/72  Pulse: 85  Temp: (!) 97.4 F (36.3 C)  SpO2: 92%   Filed Weights   04/22/20 1343  Weight: 112 lb 6 oz (51 kg)    Physical Exam  Constitutional: She is oriented to person, place, and time and well-developed, well-nourished, and in no distress.  Thin built Caucasian female patient.  Walking with a limp because of right hip pain.  HENT:  Head: Normocephalic and atraumatic.  Mouth/Throat: Oropharynx is clear and moist. No oropharyngeal exudate.  Eyes: Pupils are equal, round, and reactive to light.  Cardiovascular: Normal rate and regular rhythm.  Pulmonary/Chest: No respiratory distress. She has no wheezes.  Decreased air entry bilaterally.  No wheeze or crackles  Abdominal: Soft. Bowel sounds are normal. She exhibits no distension and no mass. There is no abdominal tenderness. There is no rebound and no guarding.  Musculoskeletal:        General: No tenderness or edema. Normal range of motion.     Cervical back: Normal  range of motion and neck supple.  Neurological: She is alert and oriented to person, place, and time.  Skin: Skin is warm.  Psychiatric: Affect normal.     LABORATORY DATA:  I have reviewed the data as listed Lab Results  Component Value Date   WBC 8.9 04/22/2020   HGB 12.1 04/22/2020   HCT 37.0 04/22/2020   MCV 93.9 04/22/2020   PLT 255 04/22/2020   Recent Labs    12/04/19 1117 12/23/19 0948 12/29/19 0232 12/30/19 0611 02/19/20 1422 02/22/20 0718 02/22/20 1237 02/22/20 1237 02/22/20 2030 02/23/20 0345 04/22/20 1323  NA  --    < > 140   < > 143   < > 139  --   --  140 140  K  --    < > 2.9*   < > 2.5*   < > 3.3*  --   --  3.4* 3.0*  CL  --    < > 88*   < > 95*   < > 97*  --   --  100 97*  CO2  --    < > 38*   < > 37*   < > 36*  --   --  32 32  GLUCOSE  --    < > 81   < > 104*   < > 87  --   --  128* 114*  BUN  --    < > 6*   < > 6*   < > <5*  --   --  <5* 8  CREATININE  --    < > 0.74   < > 0.83   < > 0.61   < > 0.70 0.65 0.68  CALCIUM  --    < > 8.1*   < > 8.7*   < > 6.8*  --   --  6.8* 8.5*  GFRNONAA  --    < > >60   < > >60   < > >60   < > >60 >60 >60  GFRAA  --    < > >60   < > >60   < > >60   < > >60 >60 >60  PROT 6.5   < > 5.1*  --  6.8  --   --   --   --  5.5*  --   ALBUMIN 3.9   < > 2.3*  --  3.6  --   --   --   --  2.8*  --   AST 20   < > 17  --  22  --   --   --   --  30  --   ALT 6   < > 7  --  8  --   --   --   --  11  --   ALKPHOS 94   < > 70  --  84  --   --   --   --  67  --   BILITOT 0.4   < > 1.0  --  0.2*  --   --   --   --  0.6  --   BILIDIR 0.11  --   --   --   --   --   --   --   --   --   --    < > = values in this interval not displayed.    RADIOGRAPHIC STUDIES: I have personally reviewed the radiological images as listed and agreed with the findings in the report. VAS [place] ABI WITH/WO TBI  Result Date: 04/16/2020 LOWER EXTREMITY DOPPLER STUDY Indications: Moderate PAD found by home screening.              No claudication or rest pain.  Patient reports touch-sensitivity to              the front of her legs. High Risk Factors: Current smoker.  Performing Technologist: Ronal Fear RVS, RCS  Examination Guidelines: A complete evaluation includes at minimum, Doppler waveform signals and systolic blood pressure reading at the level of bilateral brachial, anterior tibial, and posterior tibial arteries, when vessel segments are accessible. Bilateral testing is considered an integral part of a complete examination. Photoelectric Plethysmograph (PPG) waveforms and toe systolic pressure readings are included as required and additional duplex testing as needed. Limited examinations for reoccurring indications may be performed as noted.  ABI Findings: +---------+------------------+-----+---------+--------+ Right    Rt Pressure (mmHg)IndexWaveform Comment  +---------+------------------+-----+---------+--------+ Brachial 130                                      +---------+------------------+-----+---------+--------+ PTA      128               0.98 triphasic         +---------+------------------+-----+---------+--------+ DP       132               1.01 triphasic         +---------+------------------+-----+---------+--------+  Great Toe93                0.71                   +---------+------------------+-----+---------+--------+ +---------+------------------+-----+---------+-------+ Left     Lt Pressure (mmHg)IndexWaveform Comment +---------+------------------+-----+---------+-------+ Brachial 131                                     +---------+------------------+-----+---------+-------+ PTA      122               0.93 triphasic        +---------+------------------+-----+---------+-------+ DP       127               0.97 triphasic        +---------+------------------+-----+---------+-------+ Great Toe99                0.76                  +---------+------------------+-----+---------+-------+  +-------+-----------+-----------+------------+------------+ ABI/TBIToday's ABIToday's TBIPrevious ABIPrevious TBI +-------+-----------+-----------+------------+------------+ Right  1.01       0.71                                +-------+-----------+-----------+------------+------------+ Left   0.97       0.76                                +-------+-----------+-----------+------------+------------+  Summary: Right: Resting right ankle-brachial index is within normal range. No evidence of significant right lower extremity arterial disease. The right toe-brachial index is normal. Left: Resting left ankle-brachial index is within normal range. No evidence of significant left lower extremity arterial disease. The left toe-brachial index is normal.  *See table(s) above for measurements and observations.  Electronically signed by Curt Jews MD on 04/16/2020 at 11:59:59 AM.   Final     ASSESSMENT & PLAN:   Erythrocytosis # Erythrocytosis-isolated hematocrit/hemoglobin-elevated for the last 9 years.  Suspect secondary causes [smoking]; rather than primary [jak-2 NEG/Normal EPO; JAK2 exon 12 pending today.  #Hemoglobin today is 12.5; [likely from recent hospitalization];hematocrit normal.  No phlebotomy.   #Hypocalcemia mild-8.5 calcium recommend calcium plus vitamin D.  # Hypokalemia- K-3.0; continue supplementation follow-up with PCP/cardiology  # DISPOSITION: print out of labs from today # NO phlebotomy # Follow up in 6 months- MD/ cbc/bmp/possible Phlebotomy- Dr.B      All questions were answered. The patient knows to call the clinic with any problems, questions or concerns.    Cammie Sickle, MD 04/22/2020 2:28 PM

## 2020-04-22 NOTE — Patient Instructions (Addendum)
Medication Instructions:  Your physician has recommended you make the following change in your medication:  1-START spirolactone 12.5 mg (1/2 tablet) by mouth daily  2-STOP furosemide 3-STOP potassium  *If you need a refill on your cardiac medications before your next appointment, please call your pharmacy*  Lab Work: Your physician recommends that you return for lab work in: 3 weeks for lipid and liver panel and BMET  If you have labs (blood work) drawn today and your tests are completely normal, you will receive your results only by: Marland Kitchen MyChart Message (if you have MyChart) OR . A paper copy in the mail If you have any lab test that is abnormal or we need to change your treatment, we will call you to review the results.  Testing/Procedures: None ordered today.  Follow-Up: At [facility], you and your health needs are our priority.  As part of our continuing mission to provide you with exceptional heart care, we have created designated Provider Care Teams.  These Care Teams include your primary Cardiologist (physician) and Advanced Practice Providers (APPs -  Physician Assistants and Nurse Practitioners) who all work together to provide you with the care you need, when you need it.  We recommend signing up for the patient portal called "MyChart".  Sign up information is provided on this After Visit Summary.  MyChart is used to connect with patients for Virtual Visits (Telemedicine).  Patients are able to view lab/test results, encounter notes, upcoming appointments, etc.  Non-urgent messages can be sent to your provider as well.   To learn more about what you can do with MyChart, go to [email or web address].    Your next appointment:   3 months  The format for your next appointment:   In Person  Provider:   You may see Jenkins Rouge, MD or one of the following Advanced Practice Providers on your designated Care Team:    Truitt Merle, NP  Cecilie Kicks, NP  Kathyrn Drown,  NP

## 2020-04-22 NOTE — Assessment & Plan Note (Addendum)
#   Erythrocytosis-isolated hematocrit/hemoglobin-elevated for the last 9 years.  Suspect secondary causes [smoking]; rather than primary [jak-2 NEG/Normal EPO; JAK2 exon 12 pending today.  #Hemoglobin today is 12.5; [likely from recent hospitalization];hematocrit normal.  No phlebotomy.   #Hypocalcemia mild-8.5 calcium recommend calcium plus vitamin D.  # Hypokalemia- K-3.0; continue supplementation follow-up with PCP/cardiology  # DISPOSITION: print out of labs from today # NO phlebotomy # Follow up in 6 months- MD/ cbc/bmp/possible Phlebotomy- Dr.B

## 2020-04-24 NOTE — Telephone Encounter (Signed)
    Patient calling to request d'c order for oxygen be faxed to [facility]. They have not picked up the tanks, stating the dont have the order Fax [phone]

## 2020-04-25 NOTE — Telephone Encounter (Signed)
I cannot do this without documentation of a health professional or some kind of normal o2 sat at rest and with exertion

## 2020-04-25 NOTE — Telephone Encounter (Signed)
Same answer as the o2 sats documented were not documented to say whether they were done on o2 or not, and with exercise or rest

## 2020-04-29 LAB — JAK2 EXONS 12-15

## 2020-05-02 ENCOUNTER — Other Ambulatory Visit: Payer: Self-pay | Admitting: Internal Medicine

## 2020-05-03 DIAGNOSIS — I509 Heart failure, unspecified: Secondary | ICD-10-CM | POA: Diagnosis not present

## 2020-05-07 DIAGNOSIS — G894 Chronic pain syndrome: Secondary | ICD-10-CM | POA: Diagnosis not present

## 2020-05-07 DIAGNOSIS — M25551 Pain in right hip: Secondary | ICD-10-CM | POA: Diagnosis not present

## 2020-05-07 DIAGNOSIS — Z79891 Long term (current) use of opiate analgesic: Secondary | ICD-10-CM | POA: Diagnosis not present

## 2020-05-07 DIAGNOSIS — M961 Postlaminectomy syndrome, not elsewhere classified: Secondary | ICD-10-CM | POA: Diagnosis not present

## 2020-05-09 DIAGNOSIS — I509 Heart failure, unspecified: Secondary | ICD-10-CM | POA: Diagnosis not present

## 2020-05-15 ENCOUNTER — Other Ambulatory Visit: Payer: Self-pay | Admitting: Internal Medicine

## 2020-05-15 NOTE — Telephone Encounter (Signed)
I spoke with Denise King and she said she had to go back on the budesonide 2 weeks after stopping it as you discussed at her last office visit in March. It keeps her diarrhea at [place] for the most part she said but still some days she has accidents. She is requesting a refill Sir?

## 2020-05-15 NOTE — Telephone Encounter (Signed)
I spoke with Denise King and she made a June 24th appointment at 11:10AM with Dr Carlean Purl. I have refilled her medicine.

## 2020-05-15 NOTE — Telephone Encounter (Signed)
Refill x 1 w/ 1 refill  She needs a visit with me to f/u if not set up - in about 1 month

## 2020-05-16 ENCOUNTER — Other Ambulatory Visit: Payer: Medicare HMO | Admitting: *Deleted

## 2020-05-16 ENCOUNTER — Other Ambulatory Visit: Payer: Self-pay

## 2020-05-16 DIAGNOSIS — I2510 Atherosclerotic heart disease of native coronary artery without angina pectoris: Secondary | ICD-10-CM

## 2020-05-16 DIAGNOSIS — E785 Hyperlipidemia, unspecified: Secondary | ICD-10-CM

## 2020-05-16 LAB — BASIC METABOLIC PANEL
BUN/Creatinine Ratio: 11 — ABNORMAL LOW (ref 12–28)
BUN: 8 mg/dL (ref 8–27)
CO2: 26 mmol/L (ref 20–29)
Calcium: 9.3 mg/dL (ref 8.7–10.3)
Chloride: 98 mmol/L (ref 96–106)
Creatinine, Ser: 0.76 mg/dL (ref 0.57–1.00)
GFR calc Af Amer: 95 mL/min/{1.73_m2} (ref >59)
GFR calc non Af Amer: 82 mL/min/{1.73_m2} (ref >59)
Glucose: 90 mg/dL (ref 65–99)
Potassium: 4.1 mmol/L (ref 3.5–5.2)
Sodium: 139 mmol/L (ref 134–144)

## 2020-05-16 LAB — HEPATIC FUNCTION PANEL
ALT: 10 IU/L (ref 0–32)
AST: 24 IU/L (ref 0–40)
Albumin: 4.4 g/dL (ref 3.8–4.8)
Alkaline Phosphatase: 59 IU/L (ref 48–121)
Bilirubin Total: 0.4 mg/dL (ref 0.0–1.2)
Bilirubin, Direct: 0.16 mg/dL (ref 0.00–0.40)
Total Protein: 7 g/dL (ref 6.0–8.5)

## 2020-05-16 LAB — LIPID PANEL
Chol/HDL Ratio: 2.6 ratio (ref 0.0–4.4)
Cholesterol, Total: 160 mg/dL (ref 100–199)
HDL: 61 mg/dL (ref >39)
LDL Chol Calc (NIH): 73 mg/dL (ref 0–99)
Triglycerides: 153 mg/dL — ABNORMAL HIGH (ref 0–149)
VLDL Cholesterol Cal: 26 mg/dL (ref 5–40)

## 2020-05-24 DIAGNOSIS — M25551 Pain in right hip: Secondary | ICD-10-CM | POA: Diagnosis not present

## 2020-05-24 DIAGNOSIS — M545 Low back pain: Secondary | ICD-10-CM | POA: Diagnosis not present

## 2020-06-03 DIAGNOSIS — I509 Heart failure, unspecified: Secondary | ICD-10-CM | POA: Diagnosis not present

## 2020-06-05 ENCOUNTER — Other Ambulatory Visit: Payer: Medicare HMO

## 2020-06-05 DIAGNOSIS — G894 Chronic pain syndrome: Secondary | ICD-10-CM | POA: Diagnosis not present

## 2020-06-05 DIAGNOSIS — Z79891 Long term (current) use of opiate analgesic: Secondary | ICD-10-CM | POA: Diagnosis not present

## 2020-06-05 DIAGNOSIS — M961 Postlaminectomy syndrome, not elsewhere classified: Secondary | ICD-10-CM | POA: Diagnosis not present

## 2020-06-05 DIAGNOSIS — M25551 Pain in right hip: Secondary | ICD-10-CM | POA: Diagnosis not present

## 2020-06-09 DIAGNOSIS — I509 Heart failure, unspecified: Secondary | ICD-10-CM | POA: Diagnosis not present

## 2020-06-11 ENCOUNTER — Emergency Department (HOSPITAL_COMMUNITY): Payer: Medicare HMO

## 2020-06-11 ENCOUNTER — Telehealth: Payer: Self-pay | Admitting: Internal Medicine

## 2020-06-11 ENCOUNTER — Encounter (HOSPITAL_COMMUNITY): Payer: Self-pay | Admitting: Emergency Medicine

## 2020-06-11 ENCOUNTER — Emergency Department (HOSPITAL_COMMUNITY)
Admission: EM | Admit: 2020-06-11 | Discharge: 2020-06-11 | Disposition: A | Discharge status: Home / Self Care | Payer: Medicare HMO | Attending: Emergency Medicine | Admitting: Emergency Medicine

## 2020-06-11 ENCOUNTER — Other Ambulatory Visit: Payer: Self-pay

## 2020-06-11 DIAGNOSIS — Z79899 Other long term (current) drug therapy: Secondary | ICD-10-CM | POA: Diagnosis not present

## 2020-06-11 DIAGNOSIS — J439 Emphysema, unspecified: Secondary | ICD-10-CM | POA: Insufficient documentation

## 2020-06-11 DIAGNOSIS — R197 Diarrhea, unspecified: Secondary | ICD-10-CM | POA: Diagnosis not present

## 2020-06-11 DIAGNOSIS — R110 Nausea: Secondary | ICD-10-CM | POA: Diagnosis not present

## 2020-06-11 DIAGNOSIS — F17210 Nicotine dependence, cigarettes, uncomplicated: Secondary | ICD-10-CM | POA: Insufficient documentation

## 2020-06-11 DIAGNOSIS — R1110 Vomiting, unspecified: Secondary | ICD-10-CM | POA: Diagnosis not present

## 2020-06-11 DIAGNOSIS — I5033 Acute on chronic diastolic (congestive) heart failure: Secondary | ICD-10-CM | POA: Insufficient documentation

## 2020-06-11 DIAGNOSIS — R1011 Right upper quadrant pain: Secondary | ICD-10-CM | POA: Diagnosis present

## 2020-06-11 DIAGNOSIS — R112 Nausea with vomiting, unspecified: Secondary | ICD-10-CM | POA: Diagnosis not present

## 2020-06-11 DIAGNOSIS — I110 Hypertensive heart disease with heart failure: Secondary | ICD-10-CM | POA: Insufficient documentation

## 2020-06-11 DIAGNOSIS — Z7982 Long term (current) use of aspirin: Secondary | ICD-10-CM | POA: Diagnosis not present

## 2020-06-11 DIAGNOSIS — R69 Illness, unspecified: Secondary | ICD-10-CM | POA: Diagnosis not present

## 2020-06-11 LAB — COMPREHENSIVE METABOLIC PANEL
ALT: 14 U/L (ref 0–44)
AST: 18 U/L (ref 15–41)
Albumin: 3.8 g/dL (ref 3.5–5.0)
Alkaline Phosphatase: 44 U/L (ref 38–126)
Anion gap: 10 (ref 5–15)
BUN: 9 mg/dL (ref 8–23)
CO2: 31 mmol/L (ref 22–32)
Calcium: 8.7 mg/dL — ABNORMAL LOW (ref 8.9–10.3)
Chloride: 95 mmol/L — ABNORMAL LOW (ref 98–111)
Creatinine, Ser: 0.74 mg/dL (ref 0.44–1.00)
GFR calc Af Amer: >60 mL/min (ref >60)
GFR calc non Af Amer: >60 mL/min (ref >60)
Glucose, Bld: 99 mg/dL (ref 70–99)
Potassium: 3.7 mmol/L (ref 3.5–5.1)
Sodium: 136 mmol/L (ref 135–145)
Total Bilirubin: 0.5 mg/dL (ref 0.3–1.2)
Total Protein: 6.8 g/dL (ref 6.5–8.1)

## 2020-06-11 LAB — CBC
HCT: 44.8 % (ref 36.0–46.0)
Hemoglobin: 14.7 g/dL (ref 12.0–15.0)
MCH: 30.9 pg (ref 26.0–34.0)
MCHC: 32.8 g/dL (ref 30.0–36.0)
MCV: 94.3 fL (ref 80.0–100.0)
Platelets: 278 10*3/uL (ref 150–400)
RBC: 4.75 MIL/uL (ref 3.87–5.11)
RDW: 12.5 % (ref 11.5–15.5)
WBC: 8 10*3/uL (ref 4.0–10.5)
nRBC: 0 % (ref 0.0–0.2)

## 2020-06-11 LAB — URINALYSIS, ROUTINE W REFLEX MICROSCOPIC
Bilirubin Urine: NEGATIVE
Glucose, UA: NEGATIVE mg/dL
Hgb urine dipstick: NEGATIVE
Ketones, ur: NEGATIVE mg/dL
Leukocytes,Ua: NEGATIVE
Nitrite: NEGATIVE
Protein, ur: NEGATIVE mg/dL
Specific Gravity, Urine: 1.016 (ref 1.005–1.030)
pH: 6 (ref 5.0–8.0)

## 2020-06-11 LAB — LIPASE, BLOOD: Lipase: 31 U/L (ref 11–51)

## 2020-06-11 MED ORDER — KETOROLAC TROMETHAMINE 15 MG/ML IJ SOLN
15.0000 mg | Freq: Once | INTRAMUSCULAR | Status: AC
  Administered 2020-06-11: 15 mg via INTRAVENOUS
  Filled 2020-06-11: qty 1

## 2020-06-11 MED ORDER — SODIUM CHLORIDE 0.9 % IV BOLUS (SEPSIS)
1000.0000 mL | Freq: Once | INTRAVENOUS | Status: AC
  Administered 2020-06-11: 1000 mL via INTRAVENOUS

## 2020-06-11 MED ORDER — ONDANSETRON 4 MG PO TBDP: 4.0000 mg | ORAL_TABLET | Freq: Three times a day (TID) | ORAL | 0 refills | Status: AC | PRN

## 2020-06-11 MED ORDER — SODIUM CHLORIDE 0.9% FLUSH: 3.0000 mL | Freq: Once | INTRAVENOUS | Status: DC

## 2020-06-11 MED ORDER — ONDANSETRON HCL 4 MG/2ML IJ SOLN
4.0000 mg | Freq: Once | INTRAMUSCULAR | Status: AC
  Administered 2020-06-11: 4 mg via INTRAVENOUS
  Filled 2020-06-11: qty 2

## 2020-06-11 MED ORDER — IOHEXOL 300 MG/ML  SOLN
100.0000 mL | Freq: Once | INTRAMUSCULAR | Status: AC | PRN
  Administered 2020-06-11: 100 mL via INTRAVENOUS

## 2020-06-11 NOTE — ED Notes (Signed)
Sent a urine culture with the urine specimen 

## 2020-06-11 NOTE — ED Notes (Signed)
Pt st's she feels much better after meds given

## 2020-06-11 NOTE — Telephone Encounter (Signed)
Patient is in the ED and waiting to be seen.  We scheduled follow up with Nicoletta Ba PA for 06/14/20.  She will call back to cancel depending on the course of her ED tx.

## 2020-06-11 NOTE — ED Triage Notes (Signed)
Patient arrives to ED with complaints of RUQ abdominal pain accompanied by nausea, vomiting, diarrhea for the last three days. Patient states she has not been able to eat or drink and now feels weak and has a headache.

## 2020-06-11 NOTE — Telephone Encounter (Signed)
Pt reported that she has been vomiting since 06/08/20.  Please advise.

## 2020-06-11 NOTE — Discharge Instructions (Addendum)
Thank you for allowing me to care for you today. Please return to the emergency department if you have new or worsening symptoms. Take your medications as instructed.  ° °

## 2020-06-11 NOTE — ED Provider Notes (Signed)
[facility] EMERGENCY DEPARTMENT Provider Note   CSN: [id number] Arrival date & time: 06/11/20  1219     History Chief Complaint  Patient presents with  . Abdominal Pain  . Emesis  . Diarrhea    Denise King is a 69 y.o. female.  Patient is a 69 year old female who has a history of CHF, hypertension, EtOH dependence, GERD, gastritis presenting to the emergency department for nausea, vomiting and abdominal pain.  Patient reports that this began Saturday morning briefly after waking up and prior to her having anything to eat.  Reports having bilious vomiting which is worse when she tries to eat.  She has right upper quadrant pain.  She has had a cholecystectomy as well as choledocholithiasis which was treated surgically in February.  She denies any fever, chills, dysuria.  One episode of nonbloody diarrhea.  Also has a history of Crohn's disease.        Past Medical History:  Diagnosis Date  . Acute cystitis   . Acute respiratory failure ([place])   . Allergy    as a child grew out of them  . Anemia    pernicious anemia  . Anxiety   . Arthritis   . Back pain   . Blood transfusion   . CAP (community acquired pneumonia)   . CHF (congestive heart failure) ([place])   . Chronic pain syndrome   . Common bile duct stone   . Depression   . Diverticulosis of colon   . Duodenal ulcer hemorrhagic-after biliary sphincterotomy   . Dysthymia   . Esophagitis   . EtOH dependence ([place])   . Gastritis   . GERD (gastroesophageal reflux disease)   . H/O chest pain Dec. 2013   no work up done  . Hx of colonic polyps   . Hypertension   . Irritable bowel syndrome   . Lymphocytic colitis 02/16/2020  . Osteopenia   . Osteoporosis   . Pneumonia 2019  . Polypharmacy   . Reflux esophagitis   . Right sided sciatica 12/22/2017  . Tobacco use disorder   . Unspecified chronic bronchitis ([place])   . Vertigo   . Vitamin B12 deficiency   . Wears glasses     Patient Active  Problem List   Diagnosis Date Noted  . Choledocholithiasis 02/22/2020  . Lymphocytic colitis 02/16/2020  . Duodenal ulcer hemorrhagic   . Malnutrition of moderate degree 01/01/2020  . Acute respiratory failure with hypoxia and hypercapnia ([place]) 12/28/2019  . Bipolar disorder ([place]) 12/28/2019  . Alcohol abuse 12/28/2019  . Anxiety 12/28/2019  . Acute on chronic diastolic CHF (congestive heart failure) ([place]) 12/28/2019  . Abnormal MRI of abdomen   . Cholelithiasis   . Common bile duct stone 12/23/2019  . Acute cholecystitis 12/23/2019  . Polycythemia 09/11/2019  . Right knee pain 08/09/2019  . Quadriceps contusion 08/09/2019  . Abnormal cardiac CT angiography 05/11/2019  . Chest pain 04/11/2019  . Acute viral syndrome 04/11/2019  . Tobacco abuse 04/11/2019  . Hyperphosphatemia 04/11/2019  . SOB (shortness of breath) 04/07/2019  . Hip fracture ([place]) 10/01/2018  . Dysuria 05/20/2018  . Right leg swelling 05/20/2018  . CAP (community acquired pneumonia) 05/13/2018  . Tachycardia 05/13/2018  . Chest wall pain 05/13/2018  . Narcotic poisoning ([place]) 05/13/2018  . EtOH dependence ([place])   . Polypharmacy   . Esophagitis   . Preventative health care 12/22/2017  . Erythrocytosis 12/22/2017  . Depression 12/22/2017  . Hyperglycemia 12/22/2017  . Right  sided sciatica 12/22/2017  . Cough 06/10/2017  . Family history of colon cancer - brother and father 03/10/2016  . Insomnia 02/11/2016  . Generalized anxiety disorder 11/25/2015  . Urinary frequency 04/25/2015  . Nausea and vomiting in adult 11/08/2014  . Cigarette smoker 08/15/2013  . Otitis media 10/27/2012  . Abdominal pain 03/30/2012  . HEPATIC CYST 02/18/2010  . Chronic pain syndrome 02/08/2010  . Vitamin B12 deficiency 10/04/2009  . GERD 01/04/2009  . ABDOMINAL PAIN-EPIGASTRIC 01/04/2009  . DYSTHYMIA 08/10/2008  . Venous (peripheral) insufficiency 08/10/2008  . Irritable bowel syndrome 08/10/2008  . Back pain 08/10/2008    . CIGARETTE SMOKER 03/06/2008  . Hx of adenomatous polyp of colon 12/06/2007  . BRONCHITIS, RECURRENT 12/06/2007  . DIVERTICULOSIS OF COLON 12/06/2007  . OSTEOPENIA 12/06/2007  . CALCULUS, KIDNEY 10/07/2007  . VERTIGO 10/07/2007  . Headache(784.0) 10/07/2007    Past Surgical History:  Procedure Laterality Date  . APPENDECTOMY    . BACK SURGERY  10-09   Dr. Patrice Paradise  . BIOPSY  02/14/2020   Procedure: BIOPSY;  Surgeon: Gatha Mayer, MD;  Location: [facility];  Service: Endoscopy;;  . CARPAL TUNNEL RELEASE Right 08/14/2014   Procedure: RIGHT CARPAL TUNNEL RELEASE AND INJECT LEFT THUMB;  Surgeon: Daryll Brod, MD;  Location: [facility];  Service: Orthopedics;  Laterality: Right;  . CHOLECYSTECTOMY N/A 02/22/2020   Procedure: LAPAROSCOPIC CHOLECYSTECTOMY WITH INTRAOPERATIVE CHOLANGIOGRAM;  Surgeon: Kieth Brightly Arta Bruce, MD;  Location: [facility];  Service: General;  Laterality: N/A;  . COLONOSCOPY    . COLONOSCOPY WITH PROPOFOL N/A 02/14/2020   Procedure: COLONOSCOPY WITH PROPOFOL;  Surgeon: Gatha Mayer, MD;  Location: [facility];  Service: Endoscopy;  Laterality: N/A;  . ENDOSCOPIC RETROGRADE CHOLANGIOPANCREATOGRAPHY (ERCP) WITH PROPOFOL N/A 12/25/2019   Procedure: ENDOSCOPIC RETROGRADE CHOLANGIOPANCREATOGRAPHY (ERCP) WITH PROPOFOL;  Surgeon: Gatha Mayer, MD;  Location: [facility];  Service: Gastroenterology;  Laterality: N/A;  . ESOPHAGOGASTRODUODENOSCOPY (EGD) WITH PROPOFOL N/A 01/06/2020   Procedure: ESOPHAGOGASTRODUODENOSCOPY (EGD) WITH PROPOFOL;  Surgeon: Irene Shipper, MD;  [facility];  Service: Endoscopy;  Laterality: N/A;  . HOT HEMOSTASIS N/A 01/06/2020   Procedure: HOT HEMOSTASIS (ARGON PLASMA COAGULATION/BICAP);  Surgeon: Irene Shipper, MD;  Location: [facility] ENDOSCOPY;  Service: Endoscopy;  Laterality: N/A;  . INTRAMEDULLARY (IM) NAIL INTERTROCHANTERIC Right 10/01/2018   Procedure: INTRAMEDULLARY (IM) NAIL INTERTROCHANTRIC;  Surgeon: [place] Park,  MD;  Location: [facility] ORS;  Service: Orthopedics;  Laterality: Right;  . LAPAROSCOPY N/A 02/21/2013   Procedure: LAPAROSCOPY OPERATIVE;  Surgeon: Margarette Asal, MD;  Location: [facility];  Service: Gynecology;  Laterality: N/A;  REQUESTING 5MM SCOPE WITH CAMERA  . LEFT HEART CATH AND CORONARY ANGIOGRAPHY N/A 05/11/2019   Procedure: LEFT HEART CATH AND CORONARY ANGIOGRAPHY;  Surgeon: Sherren Mocha, MD;  Location: [facility] CV LAB;  Service: Cardiovascular;  Laterality: N/A;  . REMOVAL OF STONES  12/25/2019   Procedure: Karlyn Agee;  Surgeon: Gatha Mayer, MD;  Location: [facility] ENDOSCOPY;  Service: Gastroenterology;;  . Clide Deutscher  01/06/2020   Procedure: Clide Deutscher;  Surgeon: Irene Shipper, MD;  Location: [facility] ENDOSCOPY;  Service: Endoscopy;;  . Joan Mayans  12/25/2019   Procedure: SPHINCTEROTOMY;  Surgeon: Gatha Mayer, MD;  Location: [facility];  Service: Gastroenterology;;  . TONSILLECTOMY    . TUBAL LIGATION    . UPPER GASTROINTESTINAL ENDOSCOPY       OB History   No obstetric history on file.     Family History  Problem Relation Age of Onset  .  Colon cancer Father   . Prostate cancer Father   . Heart disease Mother        prev MVR, also has DJD  . Colon cancer Brother   . Skin cancer Sister   . Alcohol abuse Daughter   . Bipolar disorder Daughter   . Drug abuse Daughter   . Esophageal cancer Neg Hx   . Rectal cancer Neg Hx   . Stomach cancer Neg Hx     Social History   Tobacco Use  . Smoking status: Current Every Day Smoker    Packs/day: 1.00    Years: 30.00    Pack years: 30.00    Types: Cigarettes  . Smokeless tobacco: Never Used  Vaping Use  . Vaping Use: Never used  Substance Use Topics  . Alcohol use: Yes    Alcohol/week: 3.0 standard drinks    Types: 3 Standard drinks or equivalent per week    Comment: occasional  . Drug use: No    Home Medications Prior to Admission medications   Medication Sig Start Date End Date Taking? Authorizing  Provider  ALPRAZolam (XANAX) 0.25 MG tablet Take 1 tablet (0.25 mg total) by mouth 3 (three) times daily as needed for anxiety. 03/20/20   Gatha Mayer, MD  ARIPiprazole (ABILIFY) 2 MG tablet Take 1 tablet (2 mg total) by mouth daily. 05/17/20 08/15/20  Pucilowski, Marchia Bond, MD  aspirin EC 81 MG tablet Take 1 tablet (81 mg total) by mouth daily. 04/22/20   Josue Hector, MD  budesonide (ENTOCORT EC) 3 MG 24 hr capsule TAKE 3 CAPSULES ONCE DAILY. 05/15/20   Gatha Mayer, MD  busPIRone (BUSPAR) 15 MG tablet Take 1 tablet (15 mg total) by mouth 2 (two) times daily. 04/16/20 07/15/20  Pucilowski, Marchia Bond, MD  Cholecalciferol (VITAMIN D) 50 MCG (2000 UT) tablet Take 2,000 Units by mouth daily after breakfast.     [provider]  denosumab (PROLIA) 60 MG/ML SOSY injection Inject 60 mg into the skin every 6 (six) months.    [provider]  folic acid (FOLVITE) 1 MG tablet Take 1 tablet (1 mg total) by mouth daily. Patient taking differently: Take 1 mg by mouth daily after breakfast.  09/05/18   Biagio Borg, MD  gabapentin (NEURONTIN) 300 MG capsule Take 300 mg by mouth 3 (three) times daily.    [provider]  hyoscyamine (ANASPAZ) 0.125 MG TBDP disintergrating tablet Place 0.125 mg under the tongue every 6 (six) hours as needed ([name] cramps).     [provider]  ibuprofen (ADVIL) 800 MG tablet Take 1 tablet (800 mg total) by mouth every 8 (eight) hours as needed. 02/22/20   Kinsinger, Arta Bruce, MD  morphine (MS CONTIN) 60 MG 12 hr tablet Take 60 mg by mouth every 12 (twelve) hours. 05/02/19   [provider]  Multiple Vitamin (MULTIVITAMIN WITH MINERALS) TABS tablet Take 1 tablet by mouth daily. 01/05/20   Swayze, Ava, DO  nicotine (NICODERM CQ - DOSED IN MG/24 HOURS) 21 mg/24hr patch Place 1 patch (21 mg total) onto the skin daily. 01/05/20   Swayze, Ava, DO  nitroGLYCERIN (NITROSTAT) 0.4 MG SL tablet Place 1 tablet (0.4 mg total) under the tongue every 5  (five) minutes as needed for chest pain. 04/22/20   Josue Hector, MD  ondansetron (ZOFRAN-ODT) 4 MG disintegrating tablet Take 1 tablet (4 mg total) by mouth every 8 (eight) hours as needed for up to 5 days for nausea  or vomiting. 06/11/20 06/16/20  Madilyn Hook A, PA-C  Oxycodone HCl 10 MG TABS Take 10 mg by mouth 4 (four) times daily.  03/06/19   [provider]  OXYGEN Inhale 2 L/min into the lungs as needed.    [provider]  pantoprazole (PROTONIX) 40 MG tablet TAKE (1) TABLET TWICE A DAY BEFORE MEALS. 05/02/20   Gatha Mayer, MD  rosuvastatin (CRESTOR) 5 MG tablet Take 1 tablet (5 mg total) by mouth every Monday, Wednesday, and Friday at 6 PM. 08/30/19 02/07/21  Josue Hector, MD  sertraline (ZOLOFT) 100 MG tablet Take 2 tablets (200 mg total) by mouth at bedtime. 05/17/20 08/15/20  Pucilowski, Marchia Bond, MD  spironolactone (ALDACTONE) 25 MG tablet Take 0.5 tablets (12.5 mg total) by mouth daily. 04/22/20   Josue Hector, MD  valACYclovir (VALTREX) 500 MG tablet Take 500 mg by mouth 2 (two) times daily as needed (flare ups).    [provider]  vitamin B-12 (CYANOCOBALAMIN) 100 MCG tablet Take 1 tablet (100 mcg total) by mouth daily. Patient taking differently: Take 100 mcg by mouth daily after breakfast.  11/03/18   Biagio Borg, MD    Allergies    Atorvastatin, Levofloxacin, Omnicef [cefdinir], Trazodone and nefazodone, Sulfa antibiotics, and Sulfonamide derivatives  Review of Systems   Review of Systems  Constitutional: Positive for appetite change and fatigue. Negative for fever.  HENT: Negative for congestion and sore throat.   Respiratory: Negative for cough and shortness of breath.   Cardiovascular: Negative for chest pain.  Gastrointestinal: Positive for abdominal pain, diarrhea, nausea and vomiting. Negative for blood in stool.  Genitourinary: Negative for dysuria.  Musculoskeletal: Positive for back pain.  Skin: Negative for rash.  All other  systems reviewed and are negative.   Physical Exam Updated Vital Signs BP 130/75 (BP Location: Right Arm)   Pulse 74   Temp 97.8 F (36.6 C) (Oral)   Resp 17   SpO2 96%   Physical Exam Vitals and nursing note reviewed.  Constitutional:      General: She is not in acute distress.    Appearance: Normal appearance. She is well-developed. She is not toxic-appearing.  HENT:     Head: Normocephalic.  Eyes:     Conjunctiva/sclera: Conjunctivae normal.  Cardiovascular:     Rate and Rhythm: Normal rate and regular rhythm.  Pulmonary:     Effort: Pulmonary effort is normal.  Abdominal:     General: Abdomen is flat. Bowel sounds are decreased.     Palpations: Abdomen is soft.     Tenderness: There is abdominal tenderness in the right upper quadrant, right lower quadrant and epigastric area. There is right CVA tenderness.  Skin:    General: Skin is warm and dry.  Neurological:     Mental Status: She is alert.  Psychiatric:        Mood and Affect: Mood normal.     ED Results / Procedures / Treatments   Labs (all labs ordered are listed, but only abnormal results are displayed) Labs Reviewed  COMPREHENSIVE METABOLIC PANEL - Abnormal; Notable for the following components:      Result Value   Chloride 95 (*)    Calcium 8.7 (*)    All other components within normal limits  LIPASE, BLOOD  CBC  URINALYSIS, ROUTINE W REFLEX MICROSCOPIC    EKG None  Radiology CT ABDOMEN PELVIS W CONTRAST  Result Date: 06/11/2020 CLINICAL DATA:  Right upper quadrant abdominal pain, nausea  and vomiting, diarrhea, symptoms for 3 days EXAM: CT ABDOMEN AND PELVIS WITH CONTRAST TECHNIQUE: Multidetector CT imaging of the abdomen and pelvis was performed using the standard protocol following bolus administration of intravenous contrast. CONTRAST:  153mL OMNIPAQUE IOHEXOL 300 MG/ML  SOLN COMPARISON:  12/23/2019 FINDINGS: Lower chest: No acute pleural or parenchymal lung disease. Hepatobiliary: No focal  liver abnormality is seen. Status post cholecystectomy. No biliary dilatation. Pancreas: Unremarkable. No pancreatic ductal dilatation or surrounding inflammatory changes. Pancreatic divisum again noted. Spleen: Normal in size without focal abnormality. Adrenals/Urinary Tract: Mild bilateral renal cortical atrophy. Small renal cysts are again noted. The adrenals are unremarkable. No urinary tract calculi or obstructive uropathy. Bladder is decompressed limiting its evaluation. Stomach/Bowel: No bowel obstruction or ileus. The appendix is surgically absent. No bowel wall thickening or inflammatory change. Vascular/Lymphatic: Aortic atherosclerosis. No enlarged abdominal or pelvic lymph nodes. Reproductive: Uterus and bilateral adnexa are unremarkable. Other: No free fluid or free gas. No abdominal wall hernia. Musculoskeletal: Stable postsurgical changes of the lumbar spine and right femur. No acute or destructive bony lesions. Reconstructed images demonstrate no additional findings. IMPRESSION: 1. No acute intra-abdominal or intrapelvic process. 2.  Emphysema (ICD10-J43.9). Electronically Signed   By: Randa Ngo M.D.   On: 06/11/2020 21:13    Procedures Procedures (including critical care time)  Medications Ordered in ED Medications  sodium chloride flush (NS) 0.9 % injection 3 mL (has no administration in time range)  sodium chloride 0.9 % bolus 1,000 mL (1,000 mLs Intravenous New Bag/Given 06/11/20 2007)  ondansetron (ZOFRAN) injection 4 mg (4 mg Intravenous Given 06/11/20 2007)  ketorolac (TORADOL) 15 MG/ML injection 15 mg (15 mg Intravenous Given 06/11/20 2007)  iohexol (OMNIPAQUE) 300 MG/ML solution 100 mL (100 mLs Intravenous Contrast Given 06/11/20 2046)    ED Course  I have reviewed the triage vital signs and the nursing notes.  Pertinent labs & imaging results that were available during my care of the patient were reviewed by me and considered in my medical decision making (see chart for  details).  Clinical Course as of Jun 11 2199  Tue Jun 15, 655  [id number] 69 year old presenting with nausea vomiting abdominal pain.  Work-up is reassuring including CT of abdomen pelvis.  She was improved with fluids, Zofran, Toradol.  She does have follow-up with her GI specialist due to her history of Crohn's and recent cholecystectomy.  She will see them this Friday.  She will be sent home with a prescription for Zofran and advised on return precautions   [KM]    Clinical Course User Index [KM] Kristine Royal   MDM Rules/Calculators/A&P                          Based on review of vitals, medical screening exam, lab work and/or imaging, there does not appear to be an acute, emergent etiology for the patient's symptoms. Counseled pt on good return precautions and encouraged both PCP and ED follow-up as needed.  Prior to discharge, I also discussed incidental imaging findings with patient in detail and advised appropriate, recommended follow-up in detail.  Clinical Impression: 1. Non-intractable vomiting with nausea, unspecified vomiting type     Disposition: Discharge  Prior to providing a prescription for a controlled substance, I independently reviewed the patient's recent prescription history on the [place]. The patient had no recent or regular prescriptions and was deemed appropriate for a brief, less than 3 day  prescription of narcotic for acute analgesia.  This note was prepared with assistance of Systems analyst. Occasional wrong-word or sound-a-like substitutions may have occurred due to the inherent limitations of voice recognition software.  Final Clinical Impression(s) / ED Diagnoses Final diagnoses:  Non-intractable vomiting with nausea, unspecified vomiting type    Rx / DC Orders ED Discharge Orders         Ordered    ondansetron (ZOFRAN-ODT) 4 MG disintegrating tablet  Every 8 hours PRN      Discontinue  Reprint     06/11/20 2159           Alveria Apley, PA-C 06/11/20 2200    Veryl Speak, MD 06/11/20 2259

## 2020-06-14 ENCOUNTER — Encounter: Payer: Self-pay | Admitting: Physician Assistant

## 2020-06-14 ENCOUNTER — Ambulatory Visit: Payer: Medicare HMO | Admitting: Physician Assistant

## 2020-06-14 VITALS — BP 112/52 | Ht 62.0 in

## 2020-06-14 DIAGNOSIS — K529 Noninfective gastroenteritis and colitis, unspecified: Secondary | ICD-10-CM | POA: Diagnosis not present

## 2020-06-14 DIAGNOSIS — R112 Nausea with vomiting, unspecified: Secondary | ICD-10-CM | POA: Diagnosis not present

## 2020-06-14 DIAGNOSIS — K52832 Lymphocytic colitis: Secondary | ICD-10-CM

## 2020-06-14 NOTE — Patient Instructions (Addendum)
If you are age 67 or older, your body mass index should be between 23-30. Your Body mass index is 20.67 kg/m. If this is out of the aforementioned range listed, please consider follow up with your Primary Care Provider.  If you are age 40 or younger, your body mass index should be between 19-25. Your Body mass index is 20.67 kg/m. If this is out of the aformentioned range listed, please consider follow up with your Primary Care Provider.   RESTART: Entocort 3mg  daily.  CONTINUE: Zofran 4mg  every 6 hours as needed for nausea.  CONTINUE: Protonix 40mg  twice daily.  Thank you for entrusting me with your care and choosing [facility].  Amy Esterwood, PA-C

## 2020-06-16 ENCOUNTER — Encounter: Payer: Self-pay | Admitting: Physician Assistant

## 2020-06-16 NOTE — Progress Notes (Signed)
Subjective:    Patient ID: Denise King, female    DOB: [birth year], 69 y.o.   MRN: [id number]  HPI [name] is a pleasant 69 year old white female, established with Dr. Carlean Purl, who comes in today after recent ER visit on 06/11/2020 with nausea and vomiting.  He says she had onset of symptoms about 3 days prior to the ER visit and had been unable to keep of much of anything down.  She was not having any abdominal pain, unless she would try to eat.  No associated diarrhea melena or hematochezia.  She did have chills and cold spells.  She is feeling better over the past couple of days and is able to eat, though still with some lingering nausea Labs done at the time of ER visit with normal CBC, c-Met, lipase and UA. CT of the abdomen and pelvis on 6/15/201 showed no ductal dilation, she does have pancreas divisum, there was no evidence of ileus or obstruction. She does have Zofran to use at home but has not been requiring it over the past few days.  She is maintained on Protonix 40 mg twice daily which she continues.  Denies any regular NSAID use. She had also recently been diagnosed with lymphocytic colitis in February 2021 while she was hospitalized.  At that time she was having 6-8 urgent diarrheal stools per day.  With use of Entocort 9 mg daily she was back to having normal bowel movements.  Since this recent acute illness she has not had a bowel movement in the past couple of days.  She states the Entocort is expensive and she is Wondering if she will need to continue long-term. Last colonoscopy February 2021 - with the exception of a 1 mm cecal polyp which was a tubular adenoma and biopsy showing lymphocytic colitis. EGD in January 2021 showed 1 nonbleeding duodenal ulcer which was at the ampulla and felt to be secondary to recent sphincterotomy.  This was treated with injection. Other medical problems include congestive heart failure, acute cholecystitis for which she underwent  cholecystectomy in February 2021, choledocholithiasis with ERCP and stone extraction 2021, bipolar disorder, anxiety, EtOH abuse.  Review of Systems Pertinent positive and negative review of systems were noted in the above HPI section.  All other review of systems was otherwise negative.  Outpatient Encounter Medications as of 06/14/2020  Medication Sig  . ALPRAZolam (XANAX) 0.25 MG tablet Take 1 tablet (0.25 mg total) by mouth 3 (three) times daily as needed for anxiety.  . ARIPiprazole (ABILIFY) 2 MG tablet Take 1 tablet (2 mg total) by mouth daily.  Marland Kitchen aspirin EC 81 MG tablet Take 1 tablet (81 mg total) by mouth daily.  . budesonide (ENTOCORT EC) 3 MG 24 hr capsule TAKE 3 CAPSULES ONCE DAILY.  . busPIRone (BUSPAR) 15 MG tablet Take 1 tablet (15 mg total) by mouth 2 (two) times daily.  . Cholecalciferol (VITAMIN D) 50 MCG (2000 UT) tablet Take 2,000 Units by mouth daily after breakfast.   . denosumab (PROLIA) 60 MG/ML SOSY injection Inject 60 mg into the skin every 6 (six) months.  . folic acid (FOLVITE) 1 MG tablet Take 1 tablet (1 mg total) by mouth daily. (Patient taking differently: Take 1 mg by mouth daily after breakfast. )  . gabapentin (NEURONTIN) 300 MG capsule Take 300 mg by mouth 3 (three) times daily.  . hyoscyamine (ANASPAZ) 0.125 MG TBDP disintergrating tablet Place 0.125 mg under the tongue every 6 (six) hours as needed ([name] cramps).   Marland Kitchen  morphine (MS CONTIN) 60 MG 12 hr tablet Take 60 mg by mouth every 12 (twelve) hours.  . Multiple Vitamin (MULTIVITAMIN WITH MINERALS) TABS tablet Take 1 tablet by mouth daily.  . nicotine (NICODERM CQ - DOSED IN MG/24 HOURS) 21 mg/24hr patch Place 1 patch (21 mg total) onto the skin daily.  . nitroGLYCERIN (NITROSTAT) 0.4 MG SL tablet Place 1 tablet (0.4 mg total) under the tongue every 5 (five) minutes as needed for chest pain.  Marland Kitchen ondansetron (ZOFRAN-ODT) 4 MG disintegrating tablet Take 1 tablet (4 mg total) by mouth every 8 (eight) hours as  needed for up to 5 days for nausea or vomiting.  . Oxycodone HCl 10 MG TABS Take 10 mg by mouth 4 (four) times daily.   . OXYGEN Inhale 2 L/min into the lungs as needed.  . pantoprazole (PROTONIX) 40 MG tablet TAKE (1) TABLET TWICE A DAY BEFORE MEALS.  . rosuvastatin (CRESTOR) 5 MG tablet Take 1 tablet (5 mg total) by mouth every Monday, Wednesday, and Friday at 6 PM.  . sertraline (ZOLOFT) 100 MG tablet Take 2 tablets (200 mg total) by mouth at bedtime.  Marland Kitchen spironolactone (ALDACTONE) 25 MG tablet Take 0.5 tablets (12.5 mg total) by mouth daily.  . valACYclovir (VALTREX) 500 MG tablet Take 500 mg by mouth 2 (two) times daily as needed (flare ups).  . vitamin B-12 (CYANOCOBALAMIN) 100 MCG tablet Take 1 tablet (100 mcg total) by mouth daily. (Patient taking differently: Take 100 mcg by mouth daily after breakfast. )  . [DISCONTINUED] ibuprofen (ADVIL) 800 MG tablet Take 1 tablet (800 mg total) by mouth every 8 (eight) hours as needed.   No facility-administered encounter medications on file as of 06/14/2020.   Allergies  Allergen Reactions  . Atorvastatin Nausea And Vomiting  . Levofloxacin Other (See Comments)    Reaction: achilles tendon pain  . Omnicef [Cefdinir] Diarrhea    Uncontrollable diarrhea  . Trazodone And Nefazodone Other (See Comments)    "Felt like I was going to faint"  . Sulfa Antibiotics Rash  . Sulfonamide Derivatives Rash   Patient Active Problem List   Diagnosis Date Noted  . Choledocholithiasis 02/22/2020  . Lymphocytic colitis 02/16/2020  . Duodenal ulcer hemorrhagic   . Malnutrition of moderate degree 01/01/2020  . Acute respiratory failure with hypoxia and hypercapnia ([place]) 12/28/2019  . Bipolar disorder ([place]) 12/28/2019  . Alcohol abuse 12/28/2019  . Anxiety 12/28/2019  . Acute on chronic diastolic CHF (congestive heart failure) ([place]) 12/28/2019  . Abnormal MRI of abdomen   . Cholelithiasis   . Common bile duct stone 12/23/2019  . Acute cholecystitis  12/23/2019  . Polycythemia 09/11/2019  . Right knee pain 08/09/2019  . Quadriceps contusion 08/09/2019  . Abnormal cardiac CT angiography 05/11/2019  . Chest pain 04/11/2019  . Acute viral syndrome 04/11/2019  . Tobacco abuse 04/11/2019  . Hyperphosphatemia 04/11/2019  . SOB (shortness of breath) 04/07/2019  . Hip fracture ([place]) 10/01/2018  . Dysuria 05/20/2018  . Right leg swelling 05/20/2018  . CAP (community acquired pneumonia) 05/13/2018  . Tachycardia 05/13/2018  . Chest wall pain 05/13/2018  . Narcotic poisoning ([place]) 05/13/2018  . EtOH dependence ([place])   . Polypharmacy   . Esophagitis   . Preventative health care 12/22/2017  . Erythrocytosis 12/22/2017  . Depression 12/22/2017  . Hyperglycemia 12/22/2017  . Right sided sciatica 12/22/2017  . Cough 06/10/2017  . Family history of colon cancer - brother and father 03/10/2016  . Insomnia 02/11/2016  .  Generalized anxiety disorder 11/25/2015  . Urinary frequency 04/25/2015  . Nausea and vomiting in adult 11/08/2014  . Cigarette smoker 08/15/2013  . Otitis media 10/27/2012  . Abdominal pain 03/30/2012  . HEPATIC CYST 02/18/2010  . Chronic pain syndrome 02/08/2010  . Vitamin B12 deficiency 10/04/2009  . GERD 01/04/2009  . ABDOMINAL PAIN-EPIGASTRIC 01/04/2009  . DYSTHYMIA 08/10/2008  . Venous (peripheral) insufficiency 08/10/2008  . Irritable bowel syndrome 08/10/2008  . Back pain 08/10/2008  . CIGARETTE SMOKER 03/06/2008  . Hx of adenomatous polyp of colon 12/06/2007  . BRONCHITIS, RECURRENT 12/06/2007  . DIVERTICULOSIS OF COLON 12/06/2007  . OSTEOPENIA 12/06/2007  . CALCULUS, KIDNEY 10/07/2007  . VERTIGO 10/07/2007  . Headache(784.0) 10/07/2007   Social History   Socioeconomic History  . Marital status: Married    Spouse name: Not on file  . Number of children: 4  . Years of education: 35  . Highest education level: Not on file  Occupational History  . Occupation: retired    Comment: disablility    Tobacco Use  . Smoking status: Current Every Day Smoker    Packs/day: 1.00    Years: 30.00    Pack years: 30.00    Types: Cigarettes  . Smokeless tobacco: Never Used  Vaping Use  . Vaping Use: Never used  Substance and Sexual Activity  . Alcohol use: Yes    Alcohol/week: 3.0 standard drinks    Types: 3 Standard drinks or equivalent per week    Comment: occasional  . Drug use: No  . Sexual activity: Yes  Other Topics Concern  . Not on file  Social History Narrative   Married x 38 yrs. Has BS degree in Horticulture from [place]. Currently resides in a house with her husband. 3 cats.  Fun: used to like to do a lot of things.    Denies religious beliefs that would effect health care.    lives with husband [name]; quit smoking- October 6th, 2019. ocasional alcohol. [employer] [2009] on disability sec to back issues.    Social Determinants of Health   Financial Resource Strain:   . Difficulty of Paying Living Expenses:   Food Insecurity:   . Worried About Charity fundraiser in the Last Year:   . Arboriculturist in the Last Year:   Transportation Needs:   . Film/video editor (Medical):   Marland Kitchen Lack of Transportation (Non-Medical):   Physical Activity:   . Days of Exercise per Week:   . Minutes of Exercise per Session:   Stress:   . Feeling of Stress :   Social Connections:   . Frequency of Communication with Friends and Family:   . Frequency of Social Gatherings with Friends and Family:   . Attends Religious Services:   . Active Member of Clubs or Organizations:   . Attends Archivist Meetings:   Marland Kitchen Marital Status:   Intimate Partner Violence:   . Fear of Current or Ex-Partner:   . Emotionally Abused:   Marland Kitchen Physically Abused:   . Sexually Abused:     Ms. Hanak family history includes Alcohol abuse in her daughter; Bipolar disorder in her daughter; Colon cancer in her brother and father; Drug abuse in her daughter; Heart disease in her mother;  Prostate cancer in her father; Skin cancer in her sister.      Objective:    Vitals:   06/14/20 1109  BP: (!) 112/52    Physical Exam Well-developed frail-appearing small older white female  in no acute distress.  Height, Weight, 113  HEENT; nontraumatic normocephalic, EOMI, PER R LA, sclera anicteric. Oropharynx;not examined Neck; supple, no JVD Cardiovascular; regular rate and rhythm with S1-S2, no murmur rub or gallop Pulmonary; Clear bilaterally Abdomen; soft, nontender, nondistended, no palpable mass or hepatosplenomegaly, bowel sounds are active Rectal;not done Skin; benign exam, no jaundice rash or appreciable lesions Extremities; no clubbing cyanosis or edema skin warm and dry Neuro/Psych; alert and oriented x4, grossly nonfocal mood and affect appropriate       Assessment & Plan:   #54  69 year old white female with acute illness over the past 7  days with nausea and vomiting and associated chills.  She had some postprandial abdominal discomfort.  No fever but did have associated chills. Symptoms have improved over the past couple of days some lingering nausea, no vomiting and is able to take p.o.'s. ER work-up on 06/11/2020 reassuring with completely normal labs and negative CT scan. Suspect this episode was likely viral in etiology. 2.  Status post cholecystectomy for acute cholecystitis February 2021, choledocholithiasis status post ERCP and stone extraction 2021 3.  Post sphincterotomy bleed with ulcer at ampulla post ERCP 4.  New diagnosis of lymphocytic colitis, asymptomatic on Entocort 9 mg p.o. daily 5.  Congestive heart failure 6.  GERD 7.  History of adenomatous colon polyps-1 small tubular adenoma at colonoscopy February 2021, indicated for 5-year interval follow-up, also has family history of colon cancer 8.  Bipolar disorder/anxiety 9.  History of EtOH abuse  Plan continue Protonix 40 mg p.o. twice daily Patient has been off Entocort over the past week  due to her acute illness and has not had recurrence of diarrhea though she says when she tried to stop Entocort a couple of months ago the diarrhea did recur.  For that reason we will restart Entocort at 3 mg p.o. daily.  She is advised to call should she have recurrence of diarrhea. Follow-up with Dr. Carlean Purl or myself in 2 months, if she is doing well may try to taper completely off Entocort.   Malerie Eakins Genia Harold PA-C 06/16/2020   Cc: Biagio Borg, MD

## 2020-06-17 ENCOUNTER — Other Ambulatory Visit: Payer: Self-pay

## 2020-06-17 ENCOUNTER — Encounter: Payer: Self-pay | Admitting: Internal Medicine

## 2020-06-17 ENCOUNTER — Ambulatory Visit (INDEPENDENT_AMBULATORY_CARE_PROVIDER_SITE_OTHER): Payer: Medicare HMO | Admitting: Internal Medicine

## 2020-06-17 VITALS — BP 122/80 | HR 67 | Temp 98.1°F | Ht 62.0 in | Wt 111.0 lb

## 2020-06-17 DIAGNOSIS — D751 Secondary polycythemia: Secondary | ICD-10-CM | POA: Diagnosis not present

## 2020-06-17 DIAGNOSIS — R739 Hyperglycemia, unspecified: Secondary | ICD-10-CM

## 2020-06-17 DIAGNOSIS — K219 Gastro-esophageal reflux disease without esophagitis: Secondary | ICD-10-CM | POA: Diagnosis not present

## 2020-06-17 NOTE — Assessment & Plan Note (Signed)
stable overall by history and exam, recent data reviewed with pt, and pt to continue medical treatment as before,  to f/u any worsening symptoms or concerns  

## 2020-06-17 NOTE — Progress Notes (Signed)
Subjective:    Patient ID: Denise King, female    DOB: 1951-08-25, 69 y.o.   MRN: [id number]  HPI  Here after recent episode 7 days n/v now resolved, lab in ED normal, and thought overall stable with GI visit June 18.  Here to f/u; overall doing ok,  Pt denies chest pain, increasing sob or doe, wheezing, orthopnea, PND, increased LE swelling, palpitations, dizziness or syncope.  Pt denies new neurological symptoms such as new headache, or facial or extremity weakness or numbness.  Pt denies polydipsia, polyuria.  Denies worsening reflux, abd pain, dysphagia, n/v, bowel change or blood. Wt Readings from Last 3 Encounters:  06/17/20 111 lb (50.3 kg)  04/22/20 113 lb (51.3 kg)  04/22/20 112 lb 6 oz (51 kg)   Past Medical History:  Diagnosis Date  . Acute cystitis   . Acute respiratory failure ([place])   . Allergy    as a child grew out of them  . Anemia    pernicious anemia  . Anxiety   . Arthritis   . Back pain   . Blood transfusion   . CAP (community acquired pneumonia)   . CHF (congestive heart failure) ([place])   . Chronic pain syndrome   . Common bile duct stone   . Depression   . Diverticulosis of colon   . Duodenal ulcer hemorrhagic-after biliary sphincterotomy   . Dysthymia   . Esophagitis   . EtOH dependence ([place])   . Gastritis   . GERD (gastroesophageal reflux disease)   . H/O chest pain Dec. 2013   no work up done  . Hx of colonic polyps   . Hypertension   . Irritable bowel syndrome   . Lymphocytic colitis 02/16/2020  . Osteopenia   . Osteoporosis   . Pneumonia 2019  . Polypharmacy   . Reflux esophagitis   . Right sided sciatica 12/22/2017  . Tobacco use disorder   . Unspecified chronic bronchitis ([place])   . Vertigo   . Vitamin B12 deficiency   . Wears glasses    Past Surgical History:  Procedure Laterality Date  . APPENDECTOMY    . BACK SURGERY  10-09   Dr. Patrice Paradise  . BIOPSY  02/14/2020   Procedure: BIOPSY;  Surgeon: Gatha Mayer, MD;  Location: [facility];  Service: Endoscopy;;  . CARPAL TUNNEL RELEASE Right 08/14/2014   Procedure: RIGHT CARPAL TUNNEL RELEASE AND INJECT LEFT THUMB;  Surgeon: Daryll Brod, MD;  Location: [facility];  Service: Orthopedics;  Laterality: Right;  . CHOLECYSTECTOMY N/A 02/22/2020   Procedure: LAPAROSCOPIC CHOLECYSTECTOMY WITH INTRAOPERATIVE CHOLANGIOGRAM;  Surgeon: Kieth Brightly Arta Bruce, MD;  Location: [facility];  Service: General;  Laterality: N/A;  . COLONOSCOPY    . COLONOSCOPY WITH PROPOFOL N/A 02/14/2020   Procedure: COLONOSCOPY WITH PROPOFOL;  Surgeon: Gatha Mayer, MD;  Location: [facility];  Service: Endoscopy;  Laterality: N/A;  . ENDOSCOPIC RETROGRADE CHOLANGIOPANCREATOGRAPHY (ERCP) WITH PROPOFOL N/A 12/25/2019   Procedure: ENDOSCOPIC RETROGRADE CHOLANGIOPANCREATOGRAPHY (ERCP) WITH PROPOFOL;  Surgeon: Gatha Mayer, MD;  Location: [facility];  Service: Gastroenterology;  Laterality: N/A;  . ESOPHAGOGASTRODUODENOSCOPY (EGD) WITH PROPOFOL N/A 01/06/2020   Procedure: ESOPHAGOGASTRODUODENOSCOPY (EGD) WITH PROPOFOL;  Surgeon: Irene Shipper, MD;  Location: [facility] ENDOSCOPY;  Service: Endoscopy;  Laterality: N/A;  . HOT HEMOSTASIS N/A 01/06/2020   Procedure: HOT HEMOSTASIS (ARGON PLASMA COAGULATION/BICAP);  Surgeon: Irene Shipper, MD;  Location: [facility] ENDOSCOPY;  Service: Endoscopy;  Laterality: N/A;  . INTRAMEDULLARY (IM) NAIL INTERTROCHANTERIC  Right 10/01/2018   Procedure: INTRAMEDULLARY (IM) NAIL INTERTROCHANTRIC;  Surgeon: Thornton Park, MD;  Location: [facility] ORS;  Service: Orthopedics;  Laterality: Right;  . LAPAROSCOPY N/A 02/21/2013   Procedure: LAPAROSCOPY OPERATIVE;  Surgeon: Margarette Asal, MD;  Location: [facility];  Service: Gynecology;  Laterality: N/A;  REQUESTING 5MM SCOPE WITH CAMERA  . LEFT HEART CATH AND CORONARY ANGIOGRAPHY N/A 05/11/2019   Procedure: LEFT HEART CATH AND CORONARY ANGIOGRAPHY;  Surgeon: Sherren Mocha, MD;  Location: [facility];  Service: Cardiovascular;   Laterality: N/A;  . REMOVAL OF STONES  12/25/2019   Procedure: Karlyn Agee;  Surgeon: Gatha Mayer, MD;  Location: [facility] ENDOSCOPY;  Service: Gastroenterology;;  . Clide Deutscher  01/06/2020   Procedure: Clide Deutscher;  Surgeon: Irene Shipper, MD;  Location: [facility] ENDOSCOPY;  Service: Endoscopy;;  . Joan Mayans  12/25/2019   Procedure: SPHINCTEROTOMY;  Surgeon: Gatha Mayer, MD;  Location: [facility];  Service: Gastroenterology;;  . TONSILLECTOMY    . TUBAL LIGATION    . UPPER GASTROINTESTINAL ENDOSCOPY      reports that she has been smoking cigarettes. She has a 30.00 pack-year smoking history. She has never used smokeless tobacco. She reports current alcohol use of about 3.0 standard drinks of alcohol per week. She reports that she does not use drugs. family history includes Alcohol abuse in her daughter; Bipolar disorder in her daughter; Colon cancer in her brother and father; Drug abuse in her daughter; Heart disease in her mother; Prostate cancer in her father; Skin cancer in her sister. Allergies  Allergen Reactions  . Atorvastatin Nausea And Vomiting  . Levofloxacin Other (See Comments)    Reaction: achilles tendon pain  . Omnicef [Cefdinir] Diarrhea    Uncontrollable diarrhea  . Trazodone And Nefazodone Other (See Comments)    "Felt like I was going to faint"  . Sulfa Antibiotics Rash  . Sulfonamide Derivatives Rash   Current Outpatient Medications on File Prior to Visit  Medication Sig Dispense Refill  . ALPRAZolam (XANAX) 0.25 MG tablet Take 1 tablet (0.25 mg total) by mouth 3 (three) times daily as needed for anxiety. 30 tablet 1  . ARIPiprazole (ABILIFY) 2 MG tablet Take 1 tablet (2 mg total) by mouth daily. 90 tablet 0  . aspirin EC 81 MG tablet Take 1 tablet (81 mg total) by mouth daily.    . budesonide (ENTOCORT EC) 3 MG 24 hr capsule TAKE 3 CAPSULES ONCE DAILY. 90 capsule 1  . busPIRone (BUSPAR) 15 MG tablet Take 1 tablet (15 mg total) by mouth 2 (two) times daily.  180 tablet 0  . Cholecalciferol (VITAMIN D) 50 MCG (2000 UT) tablet Take 2,000 Units by mouth daily after breakfast.     . denosumab (PROLIA) 60 MG/ML SOSY injection Inject 60 mg into the skin every 6 (six) months.    . folic acid (FOLVITE) 1 MG tablet Take 1 tablet (1 mg total) by mouth daily. (Patient taking differently: Take 1 mg by mouth daily after breakfast. ) 90 tablet 3  . gabapentin (NEURONTIN) 300 MG capsule Take 300 mg by mouth 3 (three) times daily.    . hyoscyamine (ANASPAZ) 0.125 MG TBDP disintergrating tablet Place 0.125 mg under the tongue every 6 (six) hours as needed ([name] cramps).     . morphine (MS CONTIN) 60 MG 12 hr tablet Take 60 mg by mouth every 12 (twelve) hours.    . Multiple Vitamin (MULTIVITAMIN WITH MINERALS) TABS tablet Take 1 tablet by mouth daily. 30 tablet  0  . nicotine (NICODERM CQ - DOSED IN MG/24 HOURS) 21 mg/24hr patch Place 1 patch (21 mg total) onto the skin daily. 28 patch 0  . nitroGLYCERIN (NITROSTAT) 0.4 MG SL tablet Place 1 tablet (0.4 mg total) under the tongue every 5 (five) minutes as needed for chest pain. 25 tablet 3  . Oxycodone HCl 10 MG TABS Take 10 mg by mouth 4 (four) times daily.     . pantoprazole (PROTONIX) 40 MG tablet TAKE (1) TABLET TWICE A DAY BEFORE MEALS. 60 tablet 4  . rosuvastatin (CRESTOR) 5 MG tablet Take 1 tablet (5 mg total) by mouth every Monday, Wednesday, and Friday at 6 PM. 36 tablet 3  . sertraline (ZOLOFT) 100 MG tablet Take 2 tablets (200 mg total) by mouth at bedtime. 180 tablet 0  . spironolactone (ALDACTONE) 25 MG tablet Take 0.5 tablets (12.5 mg total) by mouth daily. 45 tablet 3  . valACYclovir (VALTREX) 500 MG tablet Take 500 mg by mouth 2 (two) times daily as needed (flare ups).    . vitamin B-12 (CYANOCOBALAMIN) 100 MCG tablet Take 1 tablet (100 mcg total) by mouth daily. (Patient taking differently: Take 100 mcg by mouth daily after breakfast. ) 90 tablet 3   No current facility-administered medications on file  prior to visit.   Review of Systems All otherwise neg per pt     Objective:   Physical Exam BP 122/80 (BP Location: Left Arm, Patient Position: Sitting, Cuff Size: Large)   Pulse 67   Temp 98.1 F (36.7 C) (Oral)   Ht 5\' 2"  (1.575 m)   Wt 111 lb (50.3 kg)   SpO2 98%   BMI 20.30 kg/m  VS noted,  Constitutional: Pt appears in NAD HENT: Head: NCAT.  Right Ear: External ear normal.  Left Ear: External ear normal.  Eyes: . Pupils are equal, round, and reactive to light. Conjunctivae and EOM are normal Nose: without d/c or deformity Neck: Neck supple. Gross normal ROM Cardiovascular: Normal rate and regular rhythm.   Pulmonary/Chest: Effort normal and breath sounds without rales or wheezing.  Abd:  Soft, NT, ND, + BS, no organomegaly Neurological: Pt is alert. At baseline orientation, motor grossly intact Skin: Skin is warm. No rashes, other new lesions, no LE edema Psychiatric: Pt behavior is normal without agitation  All otherwise neg per pt Lab Results  Component Value Date   WBC 8.0 06/11/2020   HGB 14.7 06/11/2020   HCT 44.8 06/11/2020   PLT 278 06/11/2020   GLUCOSE 99 06/11/2020   CHOL 160 05/16/2020   TRIG 153 (H) 05/16/2020   HDL 61 05/16/2020   LDLDIRECT 102.4 05/24/2008   LDLCALC 73 05/16/2020   ALT 14 06/11/2020   AST 18 06/11/2020   NA 136 06/11/2020   K 3.7 06/11/2020   CL 95 (L) 06/11/2020   CREATININE 0.74 06/11/2020   BUN 9 06/11/2020   CO2 31 06/11/2020   TSH 0.499 04/08/2019   INR 0.9 09/10/2019   HGBA1C 5.2 10/01/2018      Assessment & Plan:

## 2020-06-17 NOTE — Patient Instructions (Signed)
Ok to stop the home o2  Please continue all other medications as before, and refills have been done if requested.  Please have the pharmacy call with any other refills you may need.  Please continue your efforts at being more active, low cholesterol diet, and weight control.  Please keep your appointments with your specialists as you may have planned

## 2020-06-17 NOTE — Assessment & Plan Note (Addendum)
Denies worsening reflux, abd pain, dysphagia, n/v, bowel change or blood, stable overall by history and exam, recent data reviewed with pt, and pt to continue medical treatment as before,  to f/u any worsening symptoms or concerns  I spent 31 minutes in preparing to see the patient by review of recent labs, imaging and procedures, obtaining and reviewing separately obtained history, communicating with the patient and family or caregiver, ordering medications, tests or procedures, and documenting clinical information in the EHR including the differential Dx, treatment, and any further evaluation and other management of gerd, hyperglycemia, polycythemia

## 2020-06-19 ENCOUNTER — Other Ambulatory Visit: Payer: Self-pay

## 2020-06-19 ENCOUNTER — Telehealth (INDEPENDENT_AMBULATORY_CARE_PROVIDER_SITE_OTHER): Payer: Medicare HMO | Admitting: Psychiatry

## 2020-06-19 DIAGNOSIS — F341 Dysthymic disorder: Secondary | ICD-10-CM

## 2020-06-19 DIAGNOSIS — F411 Generalized anxiety disorder: Secondary | ICD-10-CM

## 2020-06-19 DIAGNOSIS — R69 Illness, unspecified: Secondary | ICD-10-CM | POA: Diagnosis not present

## 2020-06-19 MED ORDER — BUPROPION HCL ER (SR) 150 MG PO TB12: 150.0000 mg | ORAL_TABLET | Freq: Every day | ORAL | 1 refills | Status: DC

## 2020-06-19 MED ORDER — BUSPIRONE HCL 15 MG PO TABS: 15.0000 mg | ORAL_TABLET | Freq: Two times a day (BID) | ORAL | 0 refills | Status: AC

## 2020-06-19 NOTE — Progress Notes (Signed)
BH MD/PA/NP OP Progress Note  06/19/2020 2:09 PM Denise King  MRN:  [id number] Interview was conducted by phone and I verified that I was speaking with the correct person using two identifiers. I discussed the limitations of evaluation and management by telemedicine and  the availability of in person appointments. Patient expressed understanding and agreed to proceed. Patient location - home; physician - home office.  Chief Complaint: Fatigue, lack of motivation.  HPI: 69yo female with chronic anxiety/depression which worsened with increased physical problems/pain. She has been for a long time on Cymbalta - we have cross tapered it to sertraline which she toleratedwell and dose has been increased to 150 mg as she continued to reports feeling anxious and depressed.She was taken off clonazepam out of concern about combining benzodiazepines with narcotic analgesics and instead buspirone was added. She finds itnot very effective but continues to take it. It is prescribed tid but Denise King takes it bid. We have added a low dose of aripiprazole to augment sertraline and Denise King reports that her mood started to improve.While in the hospital in December she was taken off hydroxyzine - she takes melatonin now and her sleep is adequate. Dr. Carlean King recently restarted alprazolam low dose as needed for anxiety but her pain specialist has been concerned about likely interaction with opioid analgesics she is on. Denise King is taking care of her husband who has several medical problems. She reports feeling tired and low on drive/motivation.    Visit Diagnosis:    ICD-10-CM   1. Generalized anxiety disorder  F41.1   2. DYSTHYMIA  F34.1     Past Psychiatric History: Please see intake H&P.  Past Medical History:  Past Medical History:  Diagnosis Date  . Acute cystitis   . Acute respiratory failure ([place])   . Allergy    as a child grew out of them  . Anemia    pernicious anemia  . Anxiety   .  Arthritis   . Back pain   . Blood transfusion   . CAP (community acquired pneumonia)   . CHF (congestive heart failure) ([place])   . Chronic pain syndrome   . Common bile duct stone   . Depression   . Diverticulosis of colon   . Duodenal ulcer hemorrhagic-after biliary sphincterotomy   . Dysthymia   . Esophagitis   . EtOH dependence ([place])   . Gastritis   . GERD (gastroesophageal reflux disease)   . H/O chest pain Dec. 2013   no work up done  . Hx of colonic polyps   . Hypertension   . Irritable bowel syndrome   . Lymphocytic colitis 02/16/2020  . Osteopenia   . Osteoporosis   . Pneumonia 2019  . Polypharmacy   . Reflux esophagitis   . Right sided sciatica 12/22/2017  . Tobacco use disorder   . Unspecified chronic bronchitis ([place])   . Vertigo   . Vitamin B12 deficiency   . Wears glasses     Past Surgical History:  Procedure Laterality Date  . APPENDECTOMY    . BACK SURGERY  10-09   Dr. Patrice Paradise  . BIOPSY  02/14/2020   Procedure: BIOPSY;  Surgeon: Gatha Mayer, MD;  Location: [facility];  Service: Endoscopy;;  . CARPAL TUNNEL RELEASE Right 08/14/2014   Procedure: RIGHT CARPAL TUNNEL RELEASE AND INJECT LEFT THUMB;  Surgeon: Daryll Brod, MD;  Location: [facility];  Service: Orthopedics;  Laterality: Right;  . CHOLECYSTECTOMY N/A 02/22/2020   Procedure: LAPAROSCOPIC CHOLECYSTECTOMY WITH  INTRAOPERATIVE CHOLANGIOGRAM;  Surgeon: Kinsinger, Arta Bruce, MD;  Location: [facility];  Service: General;  Laterality: N/A;  . COLONOSCOPY    . COLONOSCOPY WITH PROPOFOL N/A 02/14/2020   Procedure: COLONOSCOPY WITH PROPOFOL;  Surgeon: Gatha Mayer, MD;  Location: [facility];  Service: Endoscopy;  Laterality: N/A;  . ENDOSCOPIC RETROGRADE CHOLANGIOPANCREATOGRAPHY (ERCP) WITH PROPOFOL N/A 12/25/2019   Procedure: ENDOSCOPIC RETROGRADE CHOLANGIOPANCREATOGRAPHY (ERCP) WITH PROPOFOL;  Surgeon: Gatha Mayer, MD;  Location: [facility];  Service: Gastroenterology;  Laterality: N/A;   . ESOPHAGOGASTRODUODENOSCOPY (EGD) WITH PROPOFOL N/A 01/06/2020   Procedure: ESOPHAGOGASTRODUODENOSCOPY (EGD) WITH PROPOFOL;  Surgeon: Irene Shipper, MD;  Location: [facility]COPY;  Service: Endoscopy;  Laterality: N/A;  . HOT HEMOSTASIS N/A 01/06/2020   Procedure: HOT HEMOSTASIS (ARGON PLASMA COAGULATION/BICAP);  Surgeon: Irene Shipper, MD;  Location: [facility] END[facility]COPY;  Service: Endoscopy;  Laterality: N/A;  . INTRAMEDULLARY (IM) NAIL INTERTROCHANTERIC Right 10/01/2018   Procedure: INTRAMEDULLARY (IM) NAIL INTERTROCHANTRIC;  Surgeon: Thornton Park, MD;  Location: [facility] ORS;  Service: Orthopedics;  Laterality: Right;  . LAPAROSCOPY N/A 02/21/2013   Procedure: LAPAROSCOPY OPERATIVE;  Surgeon: Margarette Asal, MD;  Location: [facility];  Service: Gynecology;  Laterality: N/A;  REQUESTING 5MM SCOPE WITH CAMERA  . LEFT HEART CATH AND CORONARY ANGIOGRAPHY N/A 05/11/2019   Procedure: LEFT HEART CATH AND CORONARY ANGIOGRAPHY;  Surgeon: Sherren Mocha, MD;  Location: [facility];  Service: Cardiovascular;  Laterality: N/A;  . REMOVAL OF STONES  12/25/2019   Procedure: Karlyn Agee;  Surgeon: Gatha Mayer, MD;  Location: [facility] ENDOSCOPY;  Service: Gastroenterology;;  . Clide Deutscher  01/06/2020   Procedure: Clide Deutscher;  Surgeon: Irene Shipper, MD;  Location: [facility] END[facility];  Service: Endoscopy;;  . Joan Mayans  12/25/2019   Procedure: SPHINCTEROTOMY;  Surgeon: Gatha Mayer, MD;  Location: [facility];  Service: Gastroenterology;;  . TONSILLECTOMY    . TUBAL LIGATION    . UPPER GASTROINTESTINAL ENDOSCOPY      Family Psychiatric History: Reviewed.  Family History:  Family History  Problem Relation Age of Onset  . Colon cancer Father   . Prostate cancer Father   . Heart disease Mother        prev MVR, also has DJD  . Colon cancer Brother   . Skin cancer Sister   . Alcohol abuse Daughter   . Bipolar disorder Daughter   . Drug abuse Daughter   . Esophageal cancer Neg Hx   . Rectal cancer  Neg Hx   . Stomach cancer Neg Hx     Social History:  Social History   Socioeconomic History  . Marital status: Married    Spouse name: Not on file  . Number of children: 4  . Years of education: 98  . Highest education level: Not on file  Occupational History  . Occupation: retired    Comment: disablility  Tobacco Use  . Smoking status: Current Every Day Smoker    Packs/day: 1.00    Years: 30.00    Pack years: 30.00    Types: Cigarettes  . Smokeless tobacco: Never Used  Vaping Use  . Vaping Use: Never used  Substance and Sexual Activity  . Alcohol use: Yes    Alcohol/week: 3.0 standard drinks    Types: 3 Standard drinks or equivalent per week    Comment: occasional  . Drug use: No  . Sexual activity: Yes  Other Topics Concern  . Not on file  Social History Narrative   Married x 38 yrs.  Has BS degree in Horticulture from [employer]. Currently resides in a house with her husband. 3 cats.  Fun: used to like to do a lot of things.    Denies religious beliefs that would effect health care.    lives with husband [name]; quit smoking- October 6th, 2019. ocasional alcohol. [employer] [2009] on disability sec to back issues.    Social Determinants of Health   Financial Resource Strain:   . Difficulty of Paying Living Expenses:   Food Insecurity:   . Worried About Charity fundraiser in the Last Year:   . Arboriculturist in the Last Year:   Transportation Needs:   . Film/video editor (Medical):   Marland Kitchen Lack of Transportation (Non-Medical):   Physical Activity:   . Days of Exercise per Week:   . Minutes of Exercise per Session:   Stress:   . Feeling of Stress :   Social Connections:   . Frequency of Communication with Friends and Family:   . Frequency of Social Gatherings with Friends and Family:   . Attends Religious Services:   . Active Member of Clubs or Organizations:   . Attends Archivist Meetings:   Marland Kitchen Marital Status:     Allergies:   Allergies  Allergen Reactions  . Atorvastatin Nausea And Vomiting  . Levofloxacin Other (See Comments)    Reaction: achilles tendon pain  . Omnicef [Cefdinir] Diarrhea    Uncontrollable diarrhea  . Trazodone And Nefazodone Other (See Comments)    "Felt like I was going to faint"  . Sulfa Antibiotics Rash  . Sulfonamide Derivatives Rash    Metabolic Disorder Labs: Lab Results  Component Value Date   HGBA1C 5.2 10/01/2018   MPG 103 10/01/2018   No results found for: PROLACTIN Lab Results  Component Value Date   CHOL 160 05/16/2020   TRIG 153 (H) 05/16/2020   HDL 61 05/16/2020   CHOLHDL 2.6 05/16/2020   VLDL 10 04/09/2019   LDLCALC 73 05/16/2020   LDLCALC 47 12/04/2019   Lab Results  Component Value Date   TSH 0.499 04/08/2019   TSH 1.108 10/01/2018    Therapeutic Level Labs: No results found for: LITHIUM No results found for: VALPROATE No components found for:  CBMZ  Current Medications: Current Outpatient Medications  Medication Sig Dispense Refill  . ALPRAZolam (XANAX) 0.25 MG tablet Take 1 tablet (0.25 mg total) by mouth 3 (three) times daily as needed for anxiety. 30 tablet 1  . ARIPiprazole (ABILIFY) 2 MG tablet Take 1 tablet (2 mg total) by mouth daily. 90 tablet 0  . aspirin EC 81 MG tablet Take 1 tablet (81 mg total) by mouth daily.    . budesonide (ENTOCORT EC) 3 MG 24 hr capsule TAKE 3 CAPSULES ONCE DAILY. 90 capsule 1  . buPROPion (WELLBUTRIN SR) 150 MG 12 hr tablet Take 1 tablet (150 mg total) by mouth daily. 30 tablet 1  . [START ON 07/15/2020] busPIRone (BUSPAR) 15 MG tablet Take 1 tablet (15 mg total) by mouth 2 (two) times daily. 180 tablet 0  . Cholecalciferol (VITAMIN D) 50 MCG (2000 UT) tablet Take 2,000 Units by mouth daily after breakfast.     . denosumab (PROLIA) 60 MG/ML SOSY injection Inject 60 mg into the skin every 6 (six) months.    . folic acid (FOLVITE) 1 MG tablet Take 1 tablet (1 mg total) by mouth daily. (Patient taking differently:  Take 1 mg by mouth daily after breakfast. )  90 tablet 3  . gabapentin (NEURONTIN) 300 MG capsule Take 300 mg by mouth 3 (three) times daily.    . hyoscyamine (ANASPAZ) 0.125 MG TBDP disintergrating tablet Place 0.125 mg under the tongue every 6 (six) hours as needed ([name] cramps).     . morphine (MS CONTIN) 60 MG 12 hr tablet Take 60 mg by mouth every 12 (twelve) hours.    . Multiple Vitamin (MULTIVITAMIN WITH MINERALS) TABS tablet Take 1 tablet by mouth daily. 30 tablet 0  . nicotine (NICODERM CQ - DOSED IN MG/24 HOURS) 21 mg/24hr patch Place 1 patch (21 mg total) onto the skin daily. 28 patch 0  . nitroGLYCERIN (NITROSTAT) 0.4 MG SL tablet Place 1 tablet (0.4 mg total) under the tongue every 5 (five) minutes as needed for chest pain. 25 tablet 3  . Oxycodone HCl 10 MG TABS Take 10 mg by mouth 4 (four) times daily.     . pantoprazole (PROTONIX) 40 MG tablet TAKE (1) TABLET TWICE A DAY BEFORE MEALS. 60 tablet 4  . rosuvastatin (CRESTOR) 5 MG tablet Take 1 tablet (5 mg total) by mouth every Monday, Wednesday, and Friday at 6 PM. 36 tablet 3  . sertraline (ZOLOFT) 100 MG tablet Take 2 tablets (200 mg total) by mouth at bedtime. 180 tablet 0  . spironolactone (ALDACTONE) 25 MG tablet Take 0.5 tablets (12.5 mg total) by mouth daily. 45 tablet 3  . valACYclovir (VALTREX) 500 MG tablet Take 500 mg by mouth 2 (two) times daily as needed (flare ups).    . vitamin B-12 (CYANOCOBALAMIN) 100 MCG tablet Take 1 tablet (100 mcg total) by mouth daily. (Patient taking differently: Take 100 mcg by mouth daily after breakfast. ) 90 tablet 3   No current facility-administered medications for this visit.     Psychiatric Specialty Exam: Review of Systems  Constitutional: Positive for fatigue.  Musculoskeletal: Positive for back pain.  Psychiatric/Behavioral: The patient is nervous/anxious.     There were no vitals taken for this visit.There is no height or weight on file to calculate BMI.  General Appearance: NA   Eye Contact:  NA  Speech:  Clear and Coherent and Normal Rate  Volume:  Normal  Mood:  Anxious  Affect:  NA  Thought Process:  Goal Directed  Orientation:  Full (Time, Place, and Person)  Thought Content: Logical   Suicidal Thoughts:  No  Homicidal Thoughts:  No  Memory:  Immediate;   Good Recent;   Good Remote;   Good  Judgement:  Good  Insight:  Good  Psychomotor Activity:  NA  Concentration:  Concentration: Good  Recall:  Good  Fund of Knowledge: Good  Language: Good  Akathisia:  Negative  Handed:  Right  AIMS (if indicated): not done  Assets:  Communication Skills Desire for Improvement Financial Resources/Insurance Housing Resilience  ADL's:  Intact  Cognition: WNL  Sleep:  Fair   Screenings: PHQ2-9     Office Visit from 06/17/2020 in [place] at [facility] Visit from 03/14/2020 in [place] at [facility] Visit from 09/11/2019 in [place] Office Visit from 10/25/2015 in [facility] at [facility] Total Score 1 1 1 3   PHQ-9 Total Score 11 -- -- 18       Assessment and Plan: 68yo female with chronic anxiety/depression which worsened with increased physical problems/pain. She has been for a long time on Cymbalta - we have cross tapered it to sertraline which she toleratedwell  and dose has been increased to 150 mg as she continued to reports feeling anxious and depressed.She was taken off clonazepam out of concern about combining benzodiazepines with narcotic analgesics and instead buspirone was added. She finds itnot very effective but continues to take it. It is prescribed tid but Denise King takes it bid. We have added a low dose of aripiprazole to augment sertraline and Denise King reports that her mood started to improve.While in the hospital in December she was taken off hydroxyzine - she takes melatonin now and her sleep is adequate. Dr. Carlean King recently restarted alprazolam low dose as needed for anxiety  but her pain specialist has been concerned about likely interaction with opioid analgesics she is on. Denise King is taking care of her husband who has several medical problems. She reports feeling tired and low on drive/motivation.  JG:GEZMOQHUT disorder; GAD; Chronic pain disorder  Plan:ContinueZoloftto 200 mg at HS, buspirone15 mg bid andAbilify 2 mg in AM. I will add Wellbutrin SR 150 mg in am for fatigue/drive. Next appointment in2 months. The plan was discussed with patient who had an opportunity to ask questions and these were all answered. I spend15 minutes inphone consultation with the patient.    Stephanie Acre, MD 06/19/2020, 2:10 PM

## 2020-06-20 ENCOUNTER — Ambulatory Visit: Payer: Medicare HMO | Admitting: Internal Medicine

## 2020-06-21 IMAGING — CR DG CHEST 1V
1 series · 1 of 1 positions shown · non-contrast
Comparison: 05/15/2018 chest radiograph.

CLINICAL DATA: 67 y/o F; right hip pain after fall. History of
hypertension and pneumonia.

EXAM:
CHEST  1 VIEW

[chest ap]
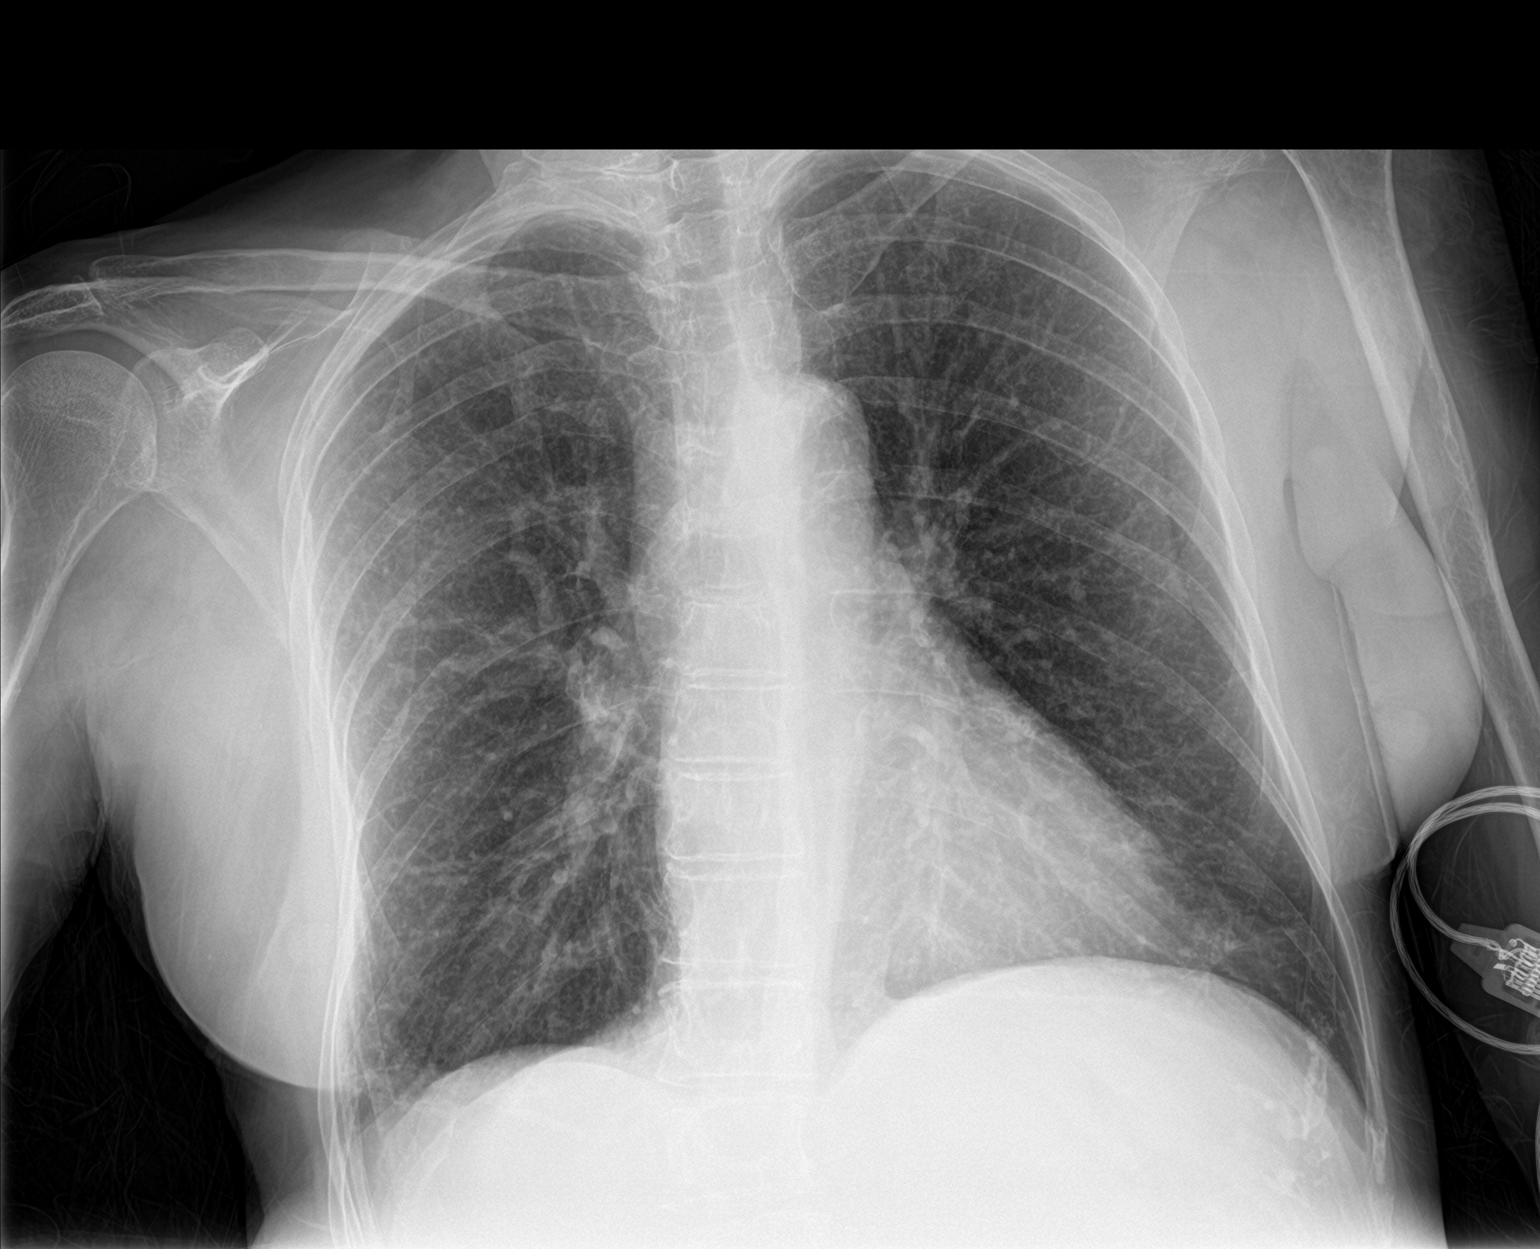

[1 of 1 positions shown; findings below may reference images not displayed]

FINDINGS: Normal cardiac silhouette. Aortic atherosclerosis with
calcification. Pulmonary venous hypertension. No focal
consolidation. No pleural effusion or pneumothorax. No acute osseous
abnormality is evident.
IMPRESSION: Pulmonary venous hypertension. No focal consolidation. Aortic
Atherosclerosis ([id number]).

By: Etongo Neiva M.D.

## 2020-06-21 IMAGING — DX DG HIP (WITH OR WITHOUT PELVIS) 1V PORT*R*
2 series · 2 of 2 positions shown · non-contrast
Comparison: 10/01/2018

CLINICAL DATA: Right hip ORIF

EXAM:
DG HIP (WITH OR WITHOUT PELVIS) 1V PORT RIGHT

[pelvis ap]
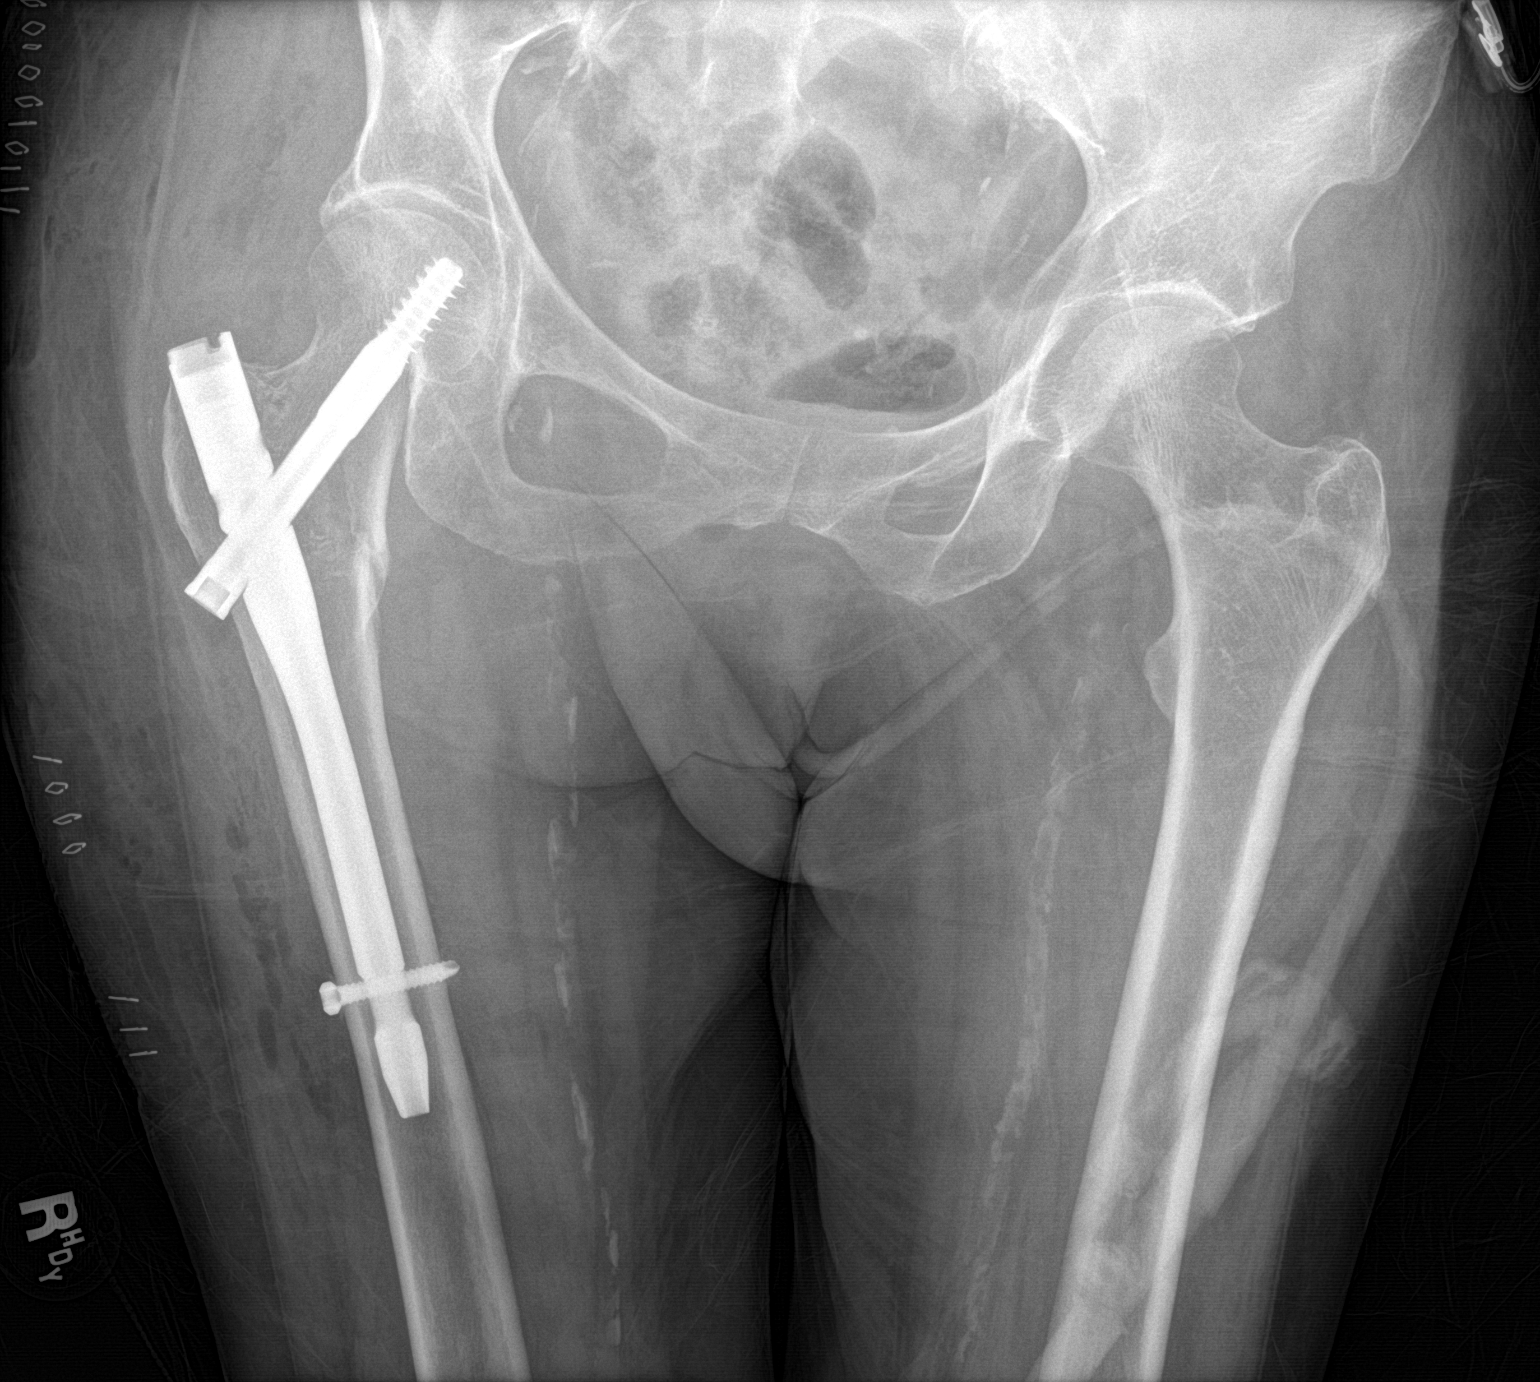

[hip lat]
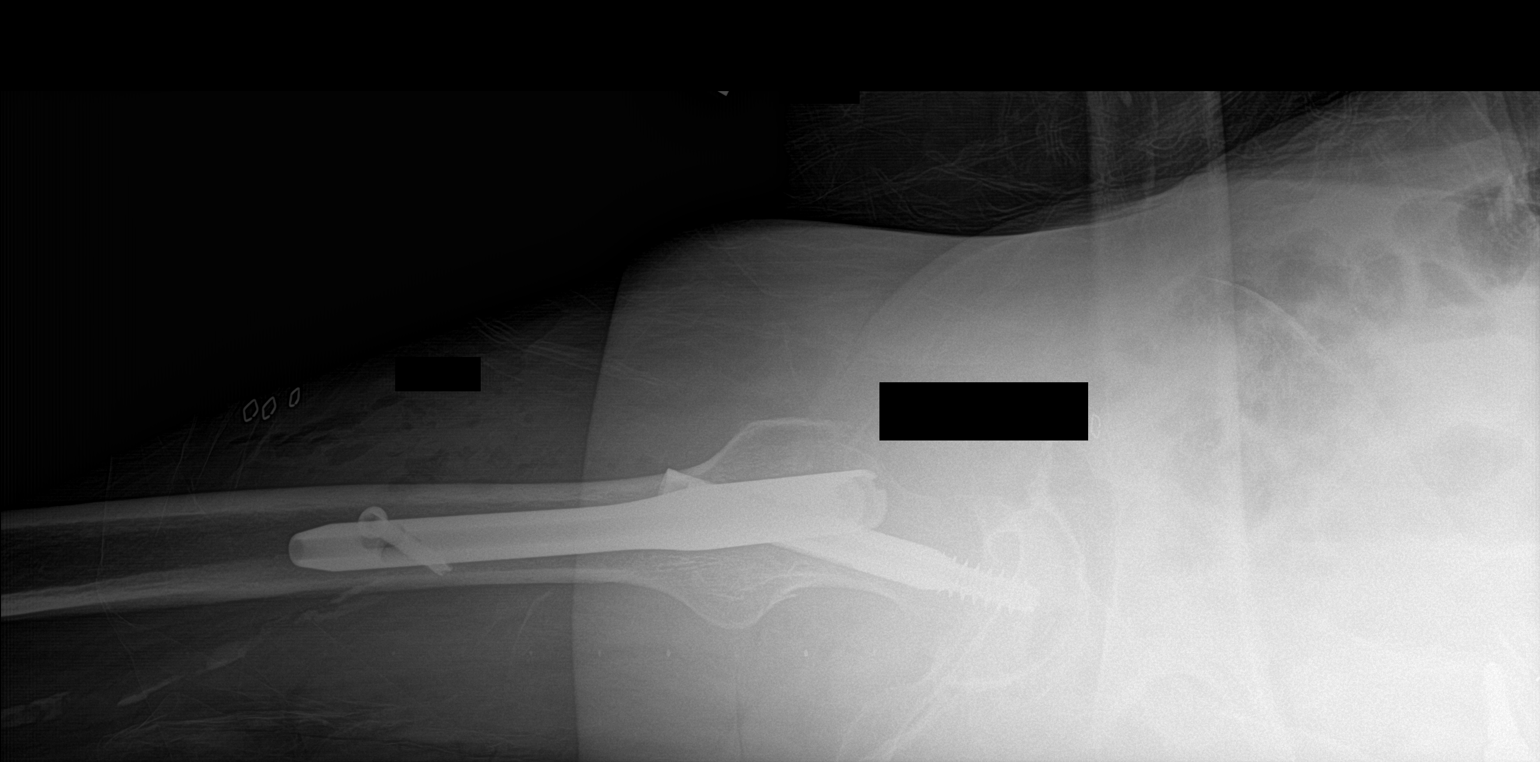

[2 of 2 positions shown; findings below may reference images not displayed]

FINDINGS: Internal fixation across the right femoral intertrochanteric
fracture. Anatomic alignment. No hardware complicating feature.
IMPRESSION: Internal fixation across the right femoral intertrochanteric
fracture with anatomic alignment and no visible complicating
feature.

## 2020-06-21 IMAGING — CR DG HIP (WITH OR WITHOUT PELVIS) 2-3V*R*
3 series · 3 of 3 positions shown · non-contrast
Comparison: None.

CLINICAL DATA: 67 y/o  F; right hip pain after fall.

EXAM:
DG HIP (WITH OR WITHOUT PELVIS) 2-3V RIGHT

[pelvis ap]
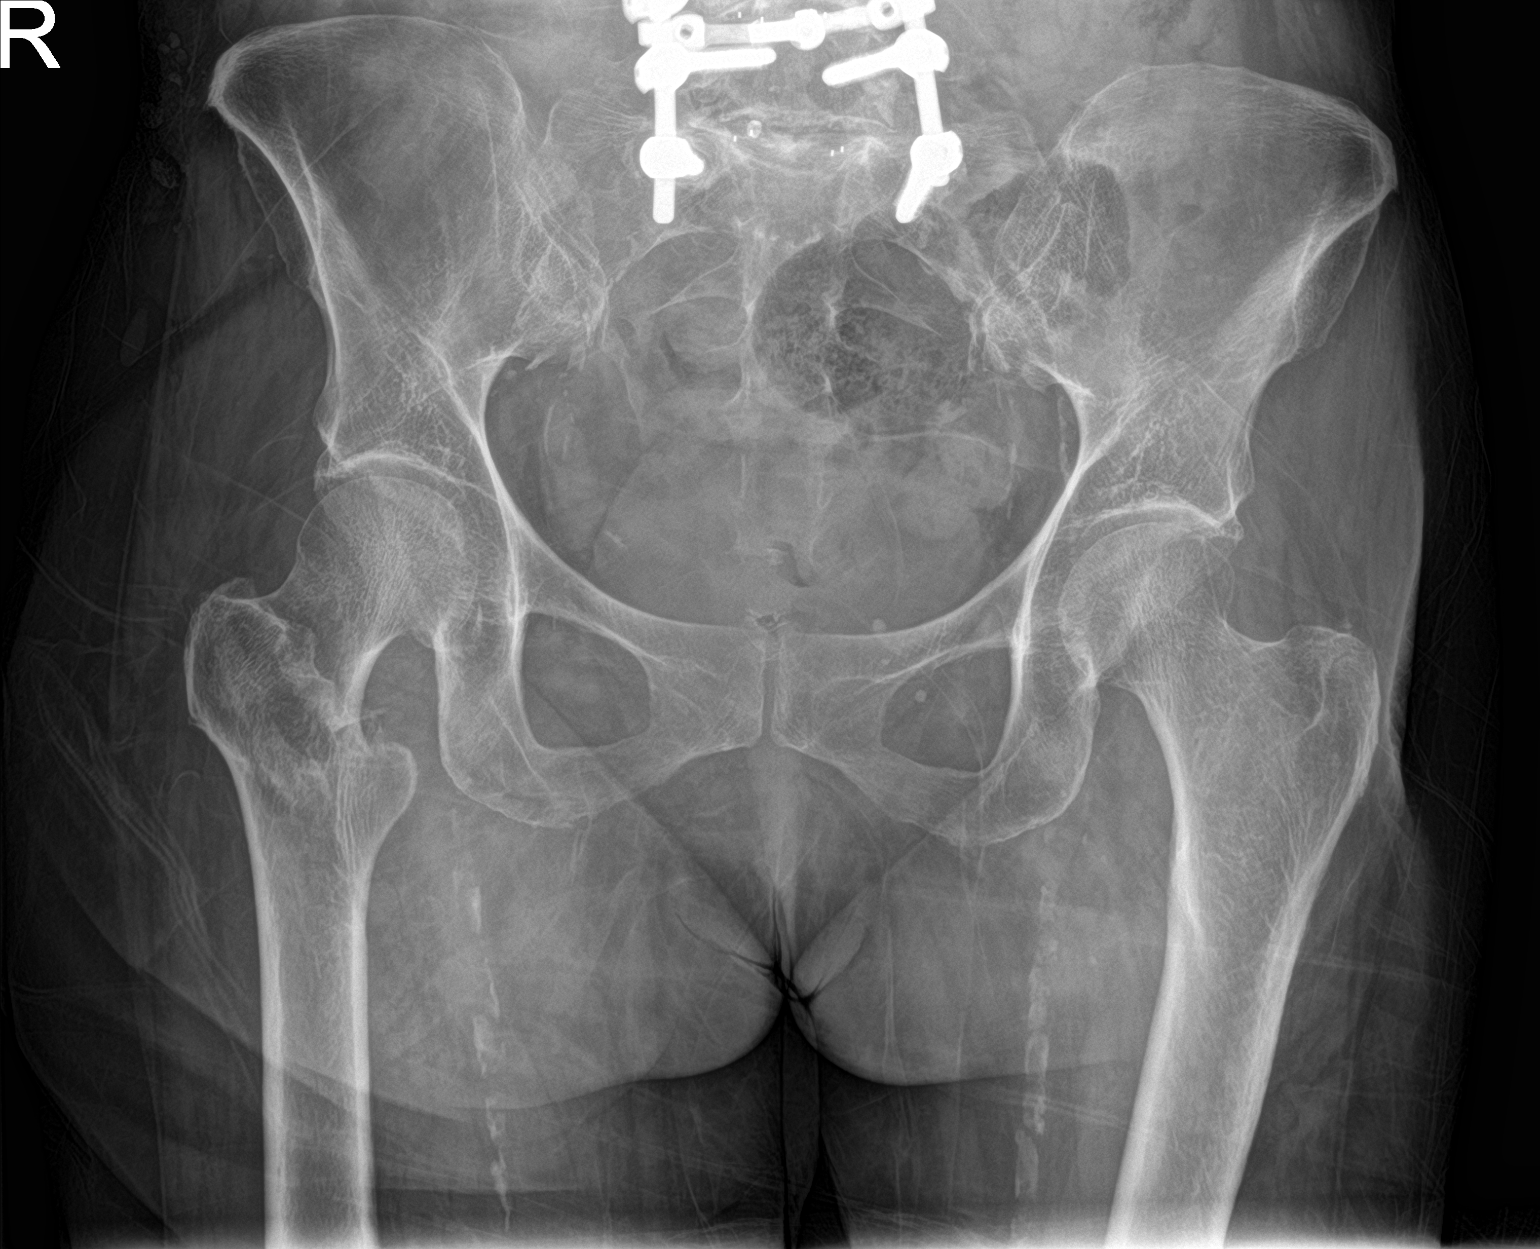

[hip ap]
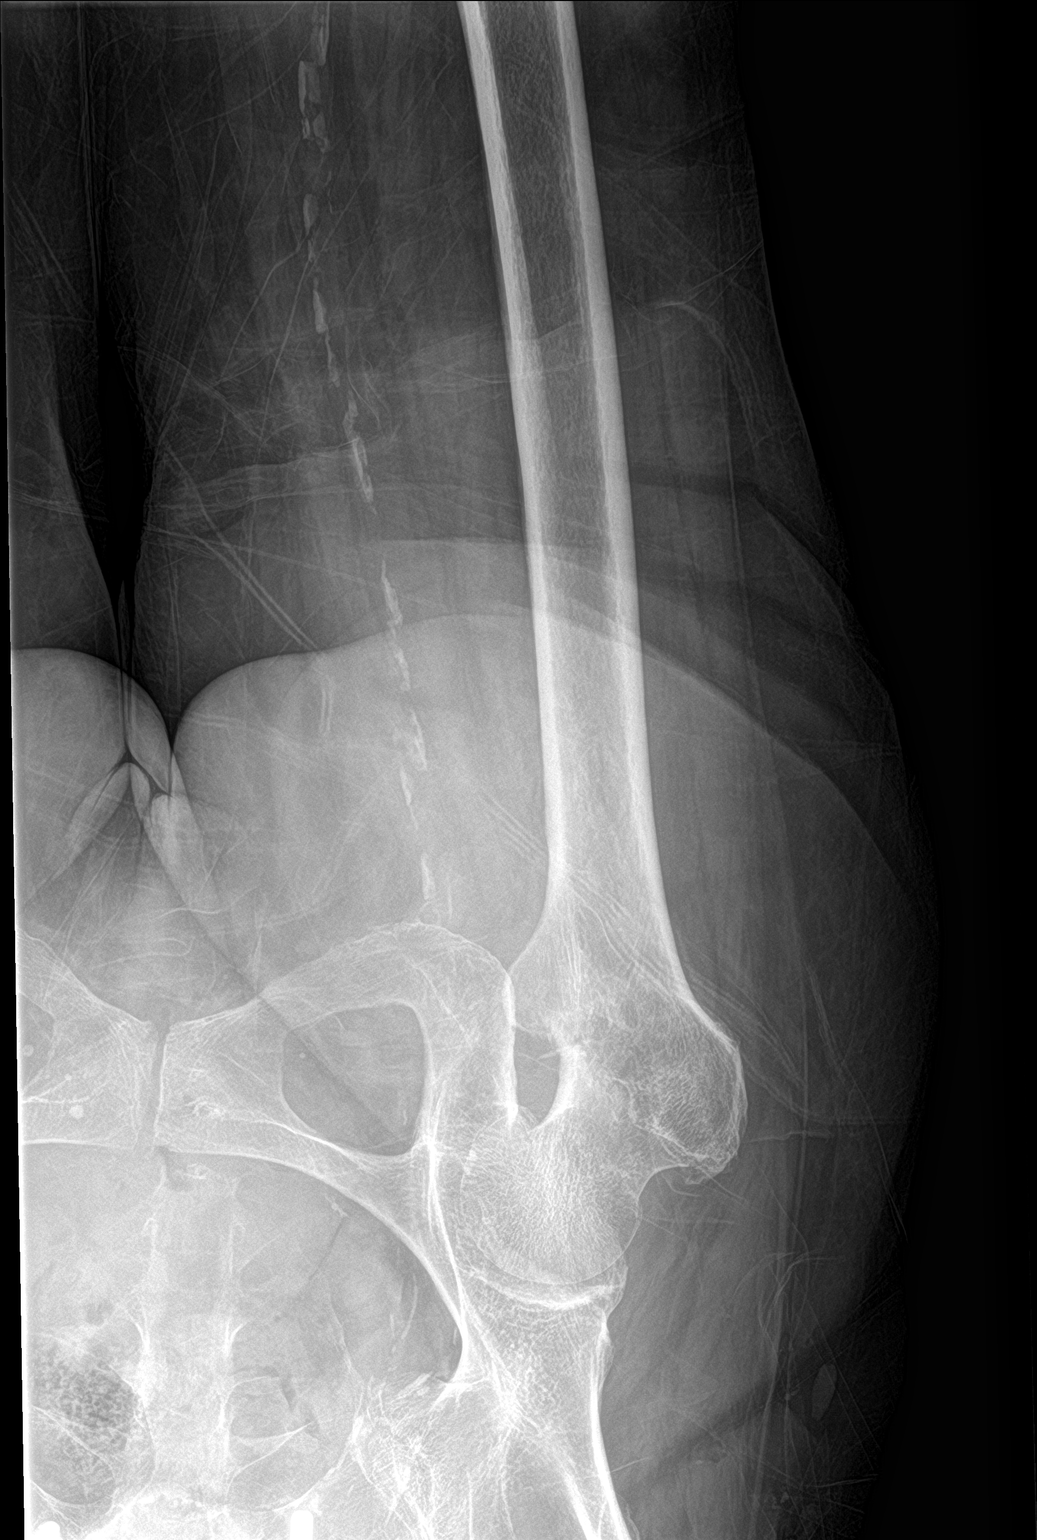

[hip lat]
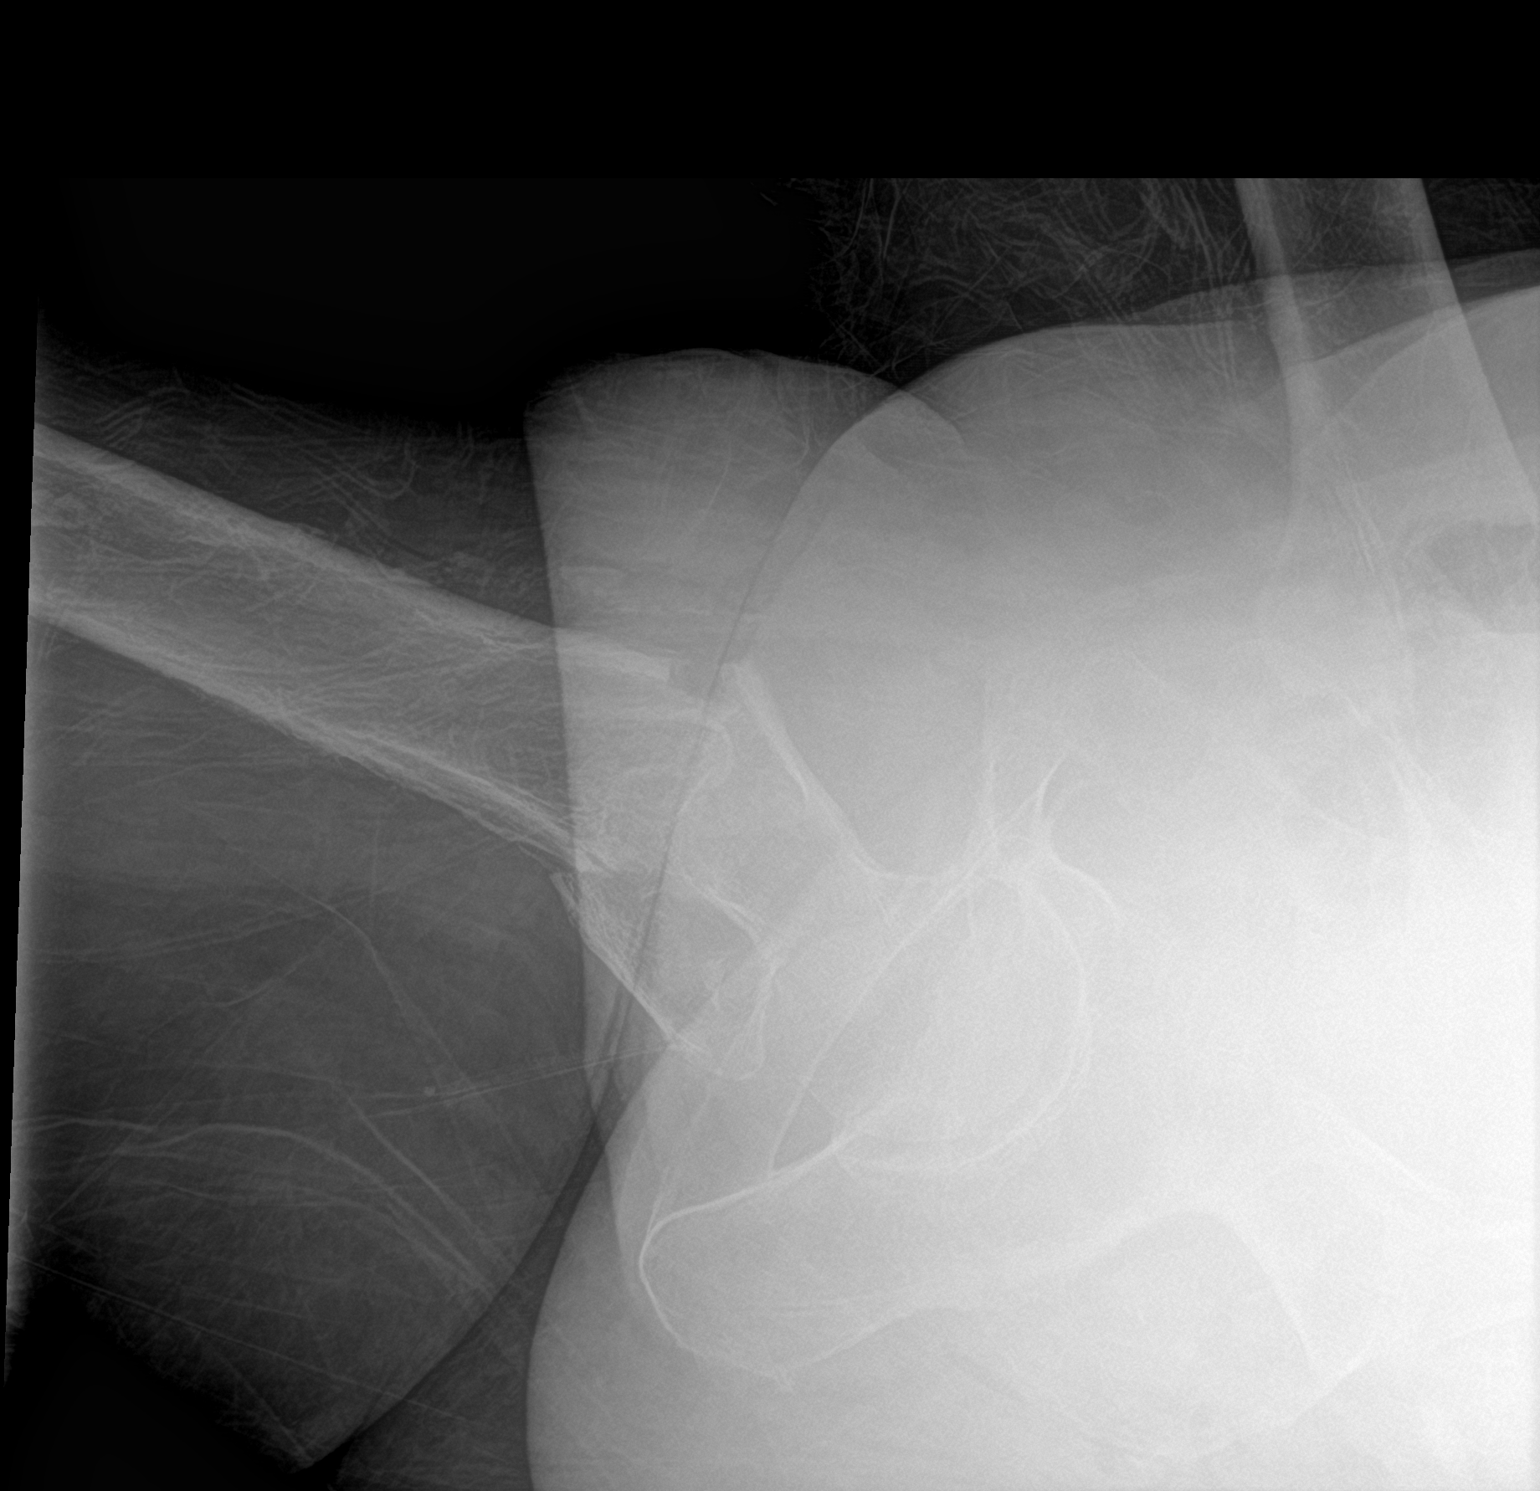

[3 of 3 positions shown; findings below may reference images not displayed]

FINDINGS: Acute intertrochanteric mildly displaced and angulated fracture of
the right proximal femur. No hip joint dislocation. No pelvic
fracture or diastasis identified. Left proximal femur and hip joint
is well maintained. Partially visualized lower lumbar spine fusion
hardware. Vascular calcifications noted.
IMPRESSION: Acute intertrochanteric mildly displaced and angulated fracture of
the right proximal femur.

By: Hanya Reza Farodiyah M.D.

## 2020-06-21 IMAGING — RF DG HIP (WITH PELVIS) OPERATIVE*R*
1 series · 4 of 4 positions shown · non-contrast
Comparison: 10/01/2018 RIGHT hip radiographs 1 minutes 8 seconds

Images obtained: 4

Dose: 9.09 mGy

CLINICAL DATA: IM nailing RIGHT hip

EXAM:
OPERATIVE RIGHT HIP (WITH PELVIS IF PERFORMED) 4 VIEWS
TECHNIQUE: Fluoroscopic spot image(s) were submitted for interpretation
post-operatively.

[Series 1: run · 4 of 4 slices shown]
[im 1/4]
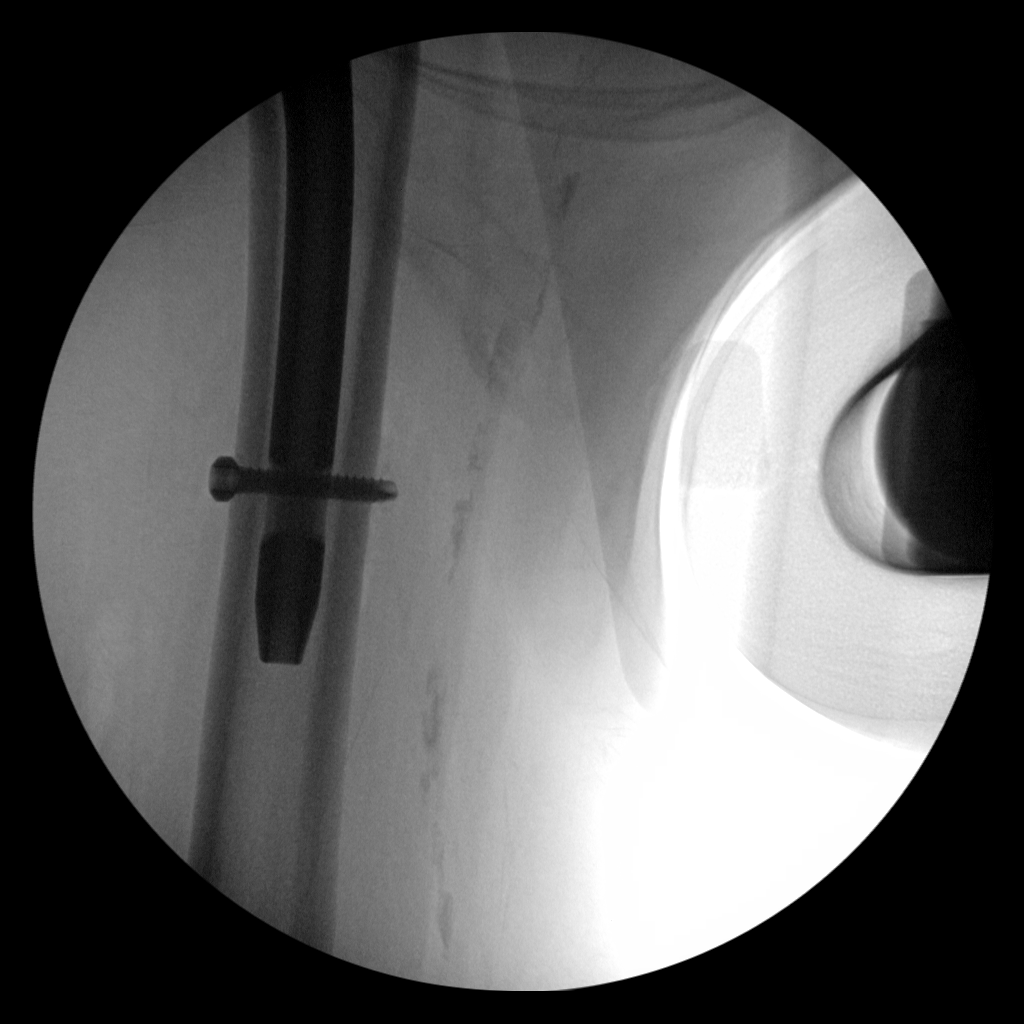
[im 2/4]
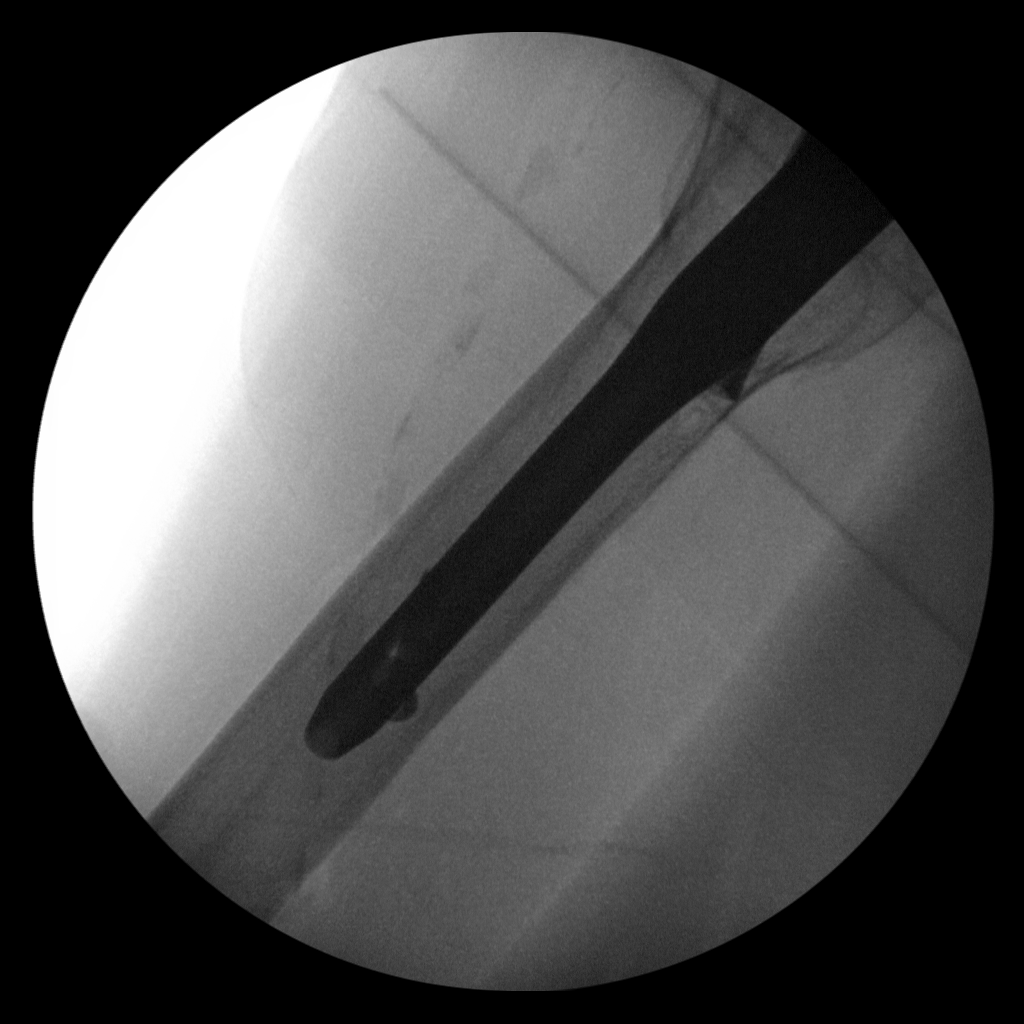
[im 3/4]
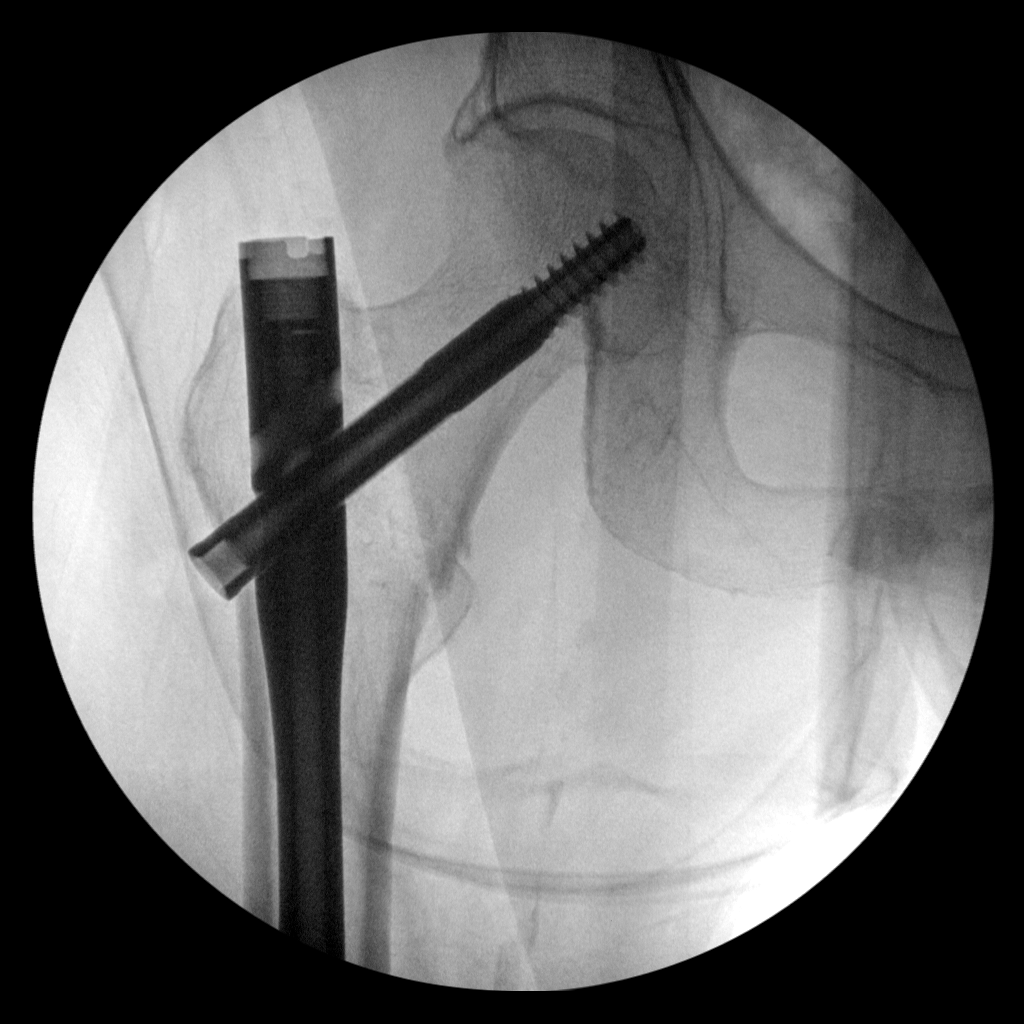
[im 4/4]
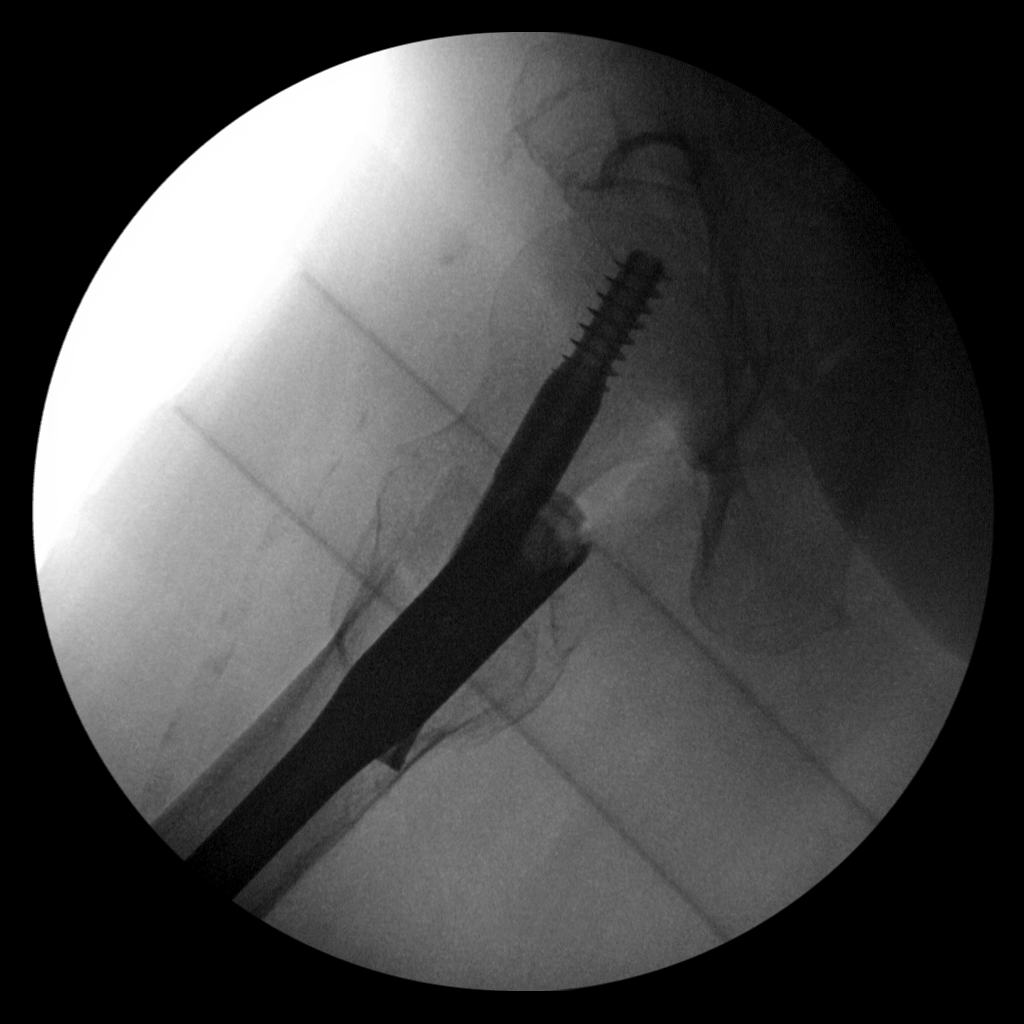

[4 of 4 positions shown; findings below may reference images not displayed]

FINDINGS: IM nail with compression screw placed across reduced
intertrochanteric fracture of RIGHT femur.

Bones demineralized.

No new fracture, dislocation or bone destruction.

Scattered atherosclerotic calcifications.
IMPRESSION: Post ORIF of a reduced intertrochanteric fracture of the RIGHT
femur.

## 2020-07-03 DIAGNOSIS — I509 Heart failure, unspecified: Secondary | ICD-10-CM | POA: Diagnosis not present

## 2020-07-04 ENCOUNTER — Emergency Department (HOSPITAL_COMMUNITY): Payer: Medicare HMO

## 2020-07-04 ENCOUNTER — Inpatient Hospital Stay (HOSPITAL_COMMUNITY)
Admission: EM | Admit: 2020-07-04 | Discharge: 2020-07-10 | DRG: 291 | Disposition: A | Discharge status: Home Health | Payer: Medicare HMO | Attending: Family Medicine | Admitting: Family Medicine

## 2020-07-04 ENCOUNTER — Encounter (HOSPITAL_COMMUNITY): Payer: Self-pay | Admitting: Emergency Medicine

## 2020-07-04 DIAGNOSIS — E876 Hypokalemia: Secondary | ICD-10-CM | POA: Diagnosis not present

## 2020-07-04 DIAGNOSIS — R778 Other specified abnormalities of plasma proteins: Secondary | ICD-10-CM | POA: Diagnosis not present

## 2020-07-04 DIAGNOSIS — F1020 Alcohol dependence, uncomplicated: Secondary | ICD-10-CM | POA: Diagnosis present

## 2020-07-04 DIAGNOSIS — T380X5A Adverse effect of glucocorticoids and synthetic analogues, initial encounter: Secondary | ICD-10-CM | POA: Diagnosis not present

## 2020-07-04 DIAGNOSIS — J9622 Acute and chronic respiratory failure with hypercapnia: Secondary | ICD-10-CM | POA: Diagnosis present

## 2020-07-04 DIAGNOSIS — Z7982 Long term (current) use of aspirin: Secondary | ICD-10-CM

## 2020-07-04 DIAGNOSIS — Z79899 Other long term (current) drug therapy: Secondary | ICD-10-CM | POA: Diagnosis not present

## 2020-07-04 DIAGNOSIS — K219 Gastro-esophageal reflux disease without esophagitis: Secondary | ICD-10-CM | POA: Diagnosis present

## 2020-07-04 DIAGNOSIS — Z882 Allergy status to sulfonamides status: Secondary | ICD-10-CM | POA: Diagnosis not present

## 2020-07-04 DIAGNOSIS — J9811 Atelectasis: Secondary | ICD-10-CM | POA: Diagnosis not present

## 2020-07-04 DIAGNOSIS — J690 Pneumonitis due to inhalation of food and vomit: Secondary | ICD-10-CM

## 2020-07-04 DIAGNOSIS — I509 Heart failure, unspecified: Secondary | ICD-10-CM | POA: Diagnosis not present

## 2020-07-04 DIAGNOSIS — Z8701 Personal history of pneumonia (recurrent): Secondary | ICD-10-CM

## 2020-07-04 DIAGNOSIS — Z888 Allergy status to other drugs, medicaments and biological substances status: Secondary | ICD-10-CM

## 2020-07-04 DIAGNOSIS — F17200 Nicotine dependence, unspecified, uncomplicated: Secondary | ICD-10-CM | POA: Diagnosis not present

## 2020-07-04 DIAGNOSIS — G92 Toxic encephalopathy: Secondary | ICD-10-CM | POA: Diagnosis present

## 2020-07-04 DIAGNOSIS — D72829 Elevated white blood cell count, unspecified: Secondary | ICD-10-CM | POA: Diagnosis not present

## 2020-07-04 DIAGNOSIS — R0602 Shortness of breath: Secondary | ICD-10-CM | POA: Diagnosis not present

## 2020-07-04 DIAGNOSIS — J9621 Acute and chronic respiratory failure with hypoxia: Secondary | ICD-10-CM | POA: Diagnosis not present

## 2020-07-04 DIAGNOSIS — R404 Transient alteration of awareness: Secondary | ICD-10-CM | POA: Diagnosis not present

## 2020-07-04 DIAGNOSIS — R69 Illness, unspecified: Secondary | ICD-10-CM | POA: Diagnosis not present

## 2020-07-04 DIAGNOSIS — J9611 Chronic respiratory failure with hypoxia: Secondary | ICD-10-CM | POA: Diagnosis not present

## 2020-07-04 DIAGNOSIS — I517 Cardiomegaly: Secondary | ICD-10-CM | POA: Diagnosis not present

## 2020-07-04 DIAGNOSIS — R7989 Other specified abnormal findings of blood chemistry: Secondary | ICD-10-CM | POA: Diagnosis not present

## 2020-07-04 DIAGNOSIS — I5021 Acute systolic (congestive) heart failure: Secondary | ICD-10-CM | POA: Diagnosis not present

## 2020-07-04 DIAGNOSIS — Z881 Allergy status to other antibiotic agents status: Secondary | ICD-10-CM

## 2020-07-04 DIAGNOSIS — E874 Mixed disorder of acid-base balance: Secondary | ICD-10-CM | POA: Diagnosis not present

## 2020-07-04 DIAGNOSIS — Z20822 Contact with and (suspected) exposure to covid-19: Secondary | ICD-10-CM | POA: Diagnosis not present

## 2020-07-04 DIAGNOSIS — I5023 Acute on chronic systolic (congestive) heart failure: Secondary | ICD-10-CM | POA: Diagnosis not present

## 2020-07-04 DIAGNOSIS — R4182 Altered mental status, unspecified: Secondary | ICD-10-CM | POA: Diagnosis not present

## 2020-07-04 DIAGNOSIS — G894 Chronic pain syndrome: Secondary | ICD-10-CM | POA: Diagnosis present

## 2020-07-04 DIAGNOSIS — I501 Left ventricular failure: Secondary | ICD-10-CM

## 2020-07-04 DIAGNOSIS — R000 Tachycardia, unspecified: Secondary | ICD-10-CM | POA: Diagnosis not present

## 2020-07-04 DIAGNOSIS — J449 Chronic obstructive pulmonary disease, unspecified: Secondary | ICD-10-CM | POA: Diagnosis present

## 2020-07-04 DIAGNOSIS — I110 Hypertensive heart disease with heart failure: Principal | ICD-10-CM | POA: Diagnosis present

## 2020-07-04 DIAGNOSIS — Z811 Family history of alcohol abuse and dependence: Secondary | ICD-10-CM

## 2020-07-04 DIAGNOSIS — R4020 Unspecified coma: Secondary | ICD-10-CM | POA: Diagnosis not present

## 2020-07-04 DIAGNOSIS — J9602 Acute respiratory failure with hypercapnia: Secondary | ICD-10-CM | POA: Diagnosis not present

## 2020-07-04 DIAGNOSIS — I4510 Unspecified right bundle-branch block: Secondary | ICD-10-CM | POA: Diagnosis not present

## 2020-07-04 DIAGNOSIS — N179 Acute kidney failure, unspecified: Secondary | ICD-10-CM | POA: Diagnosis not present

## 2020-07-04 DIAGNOSIS — I5033 Acute on chronic diastolic (congestive) heart failure: Secondary | ICD-10-CM | POA: Diagnosis present

## 2020-07-04 DIAGNOSIS — F17210 Nicotine dependence, cigarettes, uncomplicated: Secondary | ICD-10-CM | POA: Diagnosis present

## 2020-07-04 DIAGNOSIS — M1990 Unspecified osteoarthritis, unspecified site: Secondary | ICD-10-CM | POA: Diagnosis present

## 2020-07-04 DIAGNOSIS — F319 Bipolar disorder, unspecified: Secondary | ICD-10-CM | POA: Diagnosis present

## 2020-07-04 DIAGNOSIS — R0902 Hypoxemia: Secondary | ICD-10-CM | POA: Diagnosis not present

## 2020-07-04 DIAGNOSIS — J811 Chronic pulmonary edema: Secondary | ICD-10-CM | POA: Diagnosis not present

## 2020-07-04 DIAGNOSIS — J90 Pleural effusion, not elsewhere classified: Secondary | ICD-10-CM | POA: Diagnosis not present

## 2020-07-04 DIAGNOSIS — J9612 Chronic respiratory failure with hypercapnia: Secondary | ICD-10-CM | POA: Diagnosis not present

## 2020-07-04 LAB — CBC
HCT: 43.2 % (ref 36.0–46.0)
Hemoglobin: 13.6 g/dL (ref 12.0–15.0)
MCH: 31.1 pg (ref 26.0–34.0)
MCHC: 31.5 g/dL (ref 30.0–36.0)
MCV: 98.6 fL (ref 80.0–100.0)
Platelets: 218 10*3/uL (ref 150–400)
RBC: 4.38 MIL/uL (ref 3.87–5.11)
RDW: 12.2 % (ref 11.5–15.5)
WBC: 26.5 10*3/uL — ABNORMAL HIGH (ref 4.0–10.5)
nRBC: 0 % (ref 0.0–0.2)

## 2020-07-04 LAB — SARS CORONAVIRUS 2 BY RT PCR (HOSPITAL ORDER, PERFORMED IN ~~LOC~~ HOSPITAL LAB): SARS Coronavirus 2: NEGATIVE

## 2020-07-04 LAB — COMPREHENSIVE METABOLIC PANEL
ALT: 24 U/L (ref 0–44)
AST: 76 U/L — ABNORMAL HIGH (ref 15–41)
Albumin: 3.7 g/dL (ref 3.5–5.0)
Alkaline Phosphatase: 63 U/L (ref 38–126)
Anion gap: 16 — ABNORMAL HIGH (ref 5–15)
BUN: 13 mg/dL (ref 8–23)
CO2: 22 mmol/L (ref 22–32)
Calcium: 8.4 mg/dL — ABNORMAL LOW (ref 8.9–10.3)
Chloride: 98 mmol/L (ref 98–111)
Creatinine, Ser: 1.99 mg/dL — ABNORMAL HIGH (ref 0.44–1.00)
GFR calc Af Amer: 29 mL/min — ABNORMAL LOW (ref >60)
GFR calc non Af Amer: 25 mL/min — ABNORMAL LOW (ref >60)
Glucose, Bld: 117 mg/dL — ABNORMAL HIGH (ref 70–99)
Potassium: 4.9 mmol/L (ref 3.5–5.1)
Sodium: 136 mmol/L (ref 135–145)
Total Bilirubin: 1.4 mg/dL — ABNORMAL HIGH (ref 0.3–1.2)
Total Protein: 6.6 g/dL (ref 6.5–8.1)

## 2020-07-04 LAB — I-STAT ARTERIAL BLOOD GAS, ED
Acid-Base Excess: 1 mmol/L (ref 0.0–2.0)
Acid-Base Excess: 0 mmol/L (ref 0.0–2.0)
Bicarbonate: 31.2 mmol/L — ABNORMAL HIGH (ref 20.0–28.0)
Bicarbonate: 30.5 mmol/L — ABNORMAL HIGH (ref 20.0–28.0)
Calcium, Ion: 1.16 mmol/L (ref 1.15–1.40)
Calcium, Ion: 1.12 mmol/L — ABNORMAL LOW (ref 1.15–1.40)
HCT: 46 % (ref 36.0–46.0)
HCT: 44 % (ref 36.0–46.0)
Hemoglobin: 15.6 g/dL — ABNORMAL HIGH (ref 12.0–15.0)
Hemoglobin: 15 g/dL (ref 12.0–15.0)
O2 Saturation: 83 %
O2 Saturation: 83 %
Potassium: 3.9 mmol/L (ref 3.5–5.1)
Potassium: 3.8 mmol/L (ref 3.5–5.1)
Sodium: 137 mmol/L (ref 135–145)
Sodium: 139 mmol/L (ref 135–145)
TCO2: 34 mmol/L — ABNORMAL HIGH (ref 22–32)
TCO2: 33 mmol/L — ABNORMAL HIGH (ref 22–32)
pCO2 arterial: 78.4 mmHg (ref 32.0–48.0)
pCO2 arterial: 78.4 mmHg (ref 32.0–48.0)
pH, Arterial: 7.208 — ABNORMAL LOW (ref 7.350–7.450)
pH, Arterial: 7.197 — CL (ref 7.350–7.450)
pO2, Arterial: 60 mmHg — ABNORMAL LOW (ref 83.0–108.0)
pO2, Arterial: 61 mmHg — ABNORMAL LOW (ref 83.0–108.0)

## 2020-07-04 LAB — TROPONIN I (HIGH SENSITIVITY)
Troponin I (High Sensitivity): 469 ng/L (ref <18)
Troponin I (High Sensitivity): 608 ng/L (ref <18)

## 2020-07-04 LAB — CBC WITH DIFFERENTIAL/PLATELET
Abs Immature Granulocytes: 0 10*3/uL (ref 0.00–0.07)
Basophils Absolute: 0 10*3/uL (ref 0.0–0.1)
Basophils Relative: 0 %
Eosinophils Absolute: 0 10*3/uL (ref 0.0–0.5)
Eosinophils Relative: 0 %
HCT: 46.8 % — ABNORMAL HIGH (ref 36.0–46.0)
Hemoglobin: 14.5 g/dL (ref 12.0–15.0)
Lymphocytes Relative: 4 %
Lymphs Abs: 1 10*3/uL (ref 0.7–4.0)
MCH: 31.4 pg (ref 26.0–34.0)
MCHC: 31 g/dL (ref 30.0–36.0)
MCV: 101.3 fL — ABNORMAL HIGH (ref 80.0–100.0)
Monocytes Absolute: 2.9 10*3/uL — ABNORMAL HIGH (ref 0.1–1.0)
Monocytes Relative: 12 %
Neutro Abs: 20.3 10*3/uL — ABNORMAL HIGH (ref 1.7–7.7)
Neutrophils Relative %: 84 %
Platelets: 267 10*3/uL (ref 150–400)
RBC: 4.62 MIL/uL (ref 3.87–5.11)
RDW: 12.4 % (ref 11.5–15.5)
WBC: 24.2 10*3/uL — ABNORMAL HIGH (ref 4.0–10.5)
nRBC: 0 % (ref 0.0–0.2)
nRBC: 0 /100 WBC

## 2020-07-04 LAB — CREATININE, SERUM
Creatinine, Ser: 1.88 mg/dL — ABNORMAL HIGH (ref 0.44–1.00)
GFR calc Af Amer: 31 mL/min — ABNORMAL LOW (ref >60)
GFR calc non Af Amer: 27 mL/min — ABNORMAL LOW (ref >60)

## 2020-07-04 LAB — URINALYSIS, ROUTINE W REFLEX MICROSCOPIC
Bacteria, UA: NONE SEEN
Bilirubin Urine: NEGATIVE
Glucose, UA: NEGATIVE mg/dL
Hgb urine dipstick: NEGATIVE
Ketones, ur: NEGATIVE mg/dL
Leukocytes,Ua: NEGATIVE
Nitrite: NEGATIVE
Protein, ur: 100 mg/dL — AB
Specific Gravity, Urine: 1.017 (ref 1.005–1.030)
pH: 5 (ref 5.0–8.0)

## 2020-07-04 LAB — POCT I-STAT 7, (LYTES, BLD GAS, ICA,H+H)
Acid-Base Excess: 1 mmol/L (ref 0.0–2.0)
Bicarbonate: 29.5 mmol/L — ABNORMAL HIGH (ref 20.0–28.0)
Calcium, Ion: 1.11 mmol/L — ABNORMAL LOW (ref 1.15–1.40)
HCT: 42 % (ref 36.0–46.0)
Hemoglobin: 14.3 g/dL (ref 12.0–15.0)
O2 Saturation: 96 %
Patient temperature: 98.6
Potassium: 3.8 mmol/L (ref 3.5–5.1)
Sodium: 139 mmol/L (ref 135–145)
TCO2: 31 mmol/L (ref 22–32)
pCO2 arterial: 61.6 mmHg — ABNORMAL HIGH (ref 32.0–48.0)
pH, Arterial: 7.288 — ABNORMAL LOW (ref 7.350–7.450)
pO2, Arterial: 98 mmHg (ref 83.0–108.0)

## 2020-07-04 LAB — BRAIN NATRIURETIC PEPTIDE: B Natriuretic Peptide: 1053.1 pg/mL — ABNORMAL HIGH (ref 0.0–100.0)

## 2020-07-04 LAB — I-STAT CHEM 8, ED
BUN: 17 mg/dL (ref 8–23)
Calcium, Ion: 1 mmol/L — ABNORMAL LOW (ref 1.15–1.40)
Chloride: 96 mmol/L — ABNORMAL LOW (ref 98–111)
Creatinine, Ser: 1.9 mg/dL — ABNORMAL HIGH (ref 0.44–1.00)
Glucose, Bld: 115 mg/dL — ABNORMAL HIGH (ref 70–99)
HCT: 47 % — ABNORMAL HIGH (ref 36.0–46.0)
Hemoglobin: 16 g/dL — ABNORMAL HIGH (ref 12.0–15.0)
Potassium: 4.2 mmol/L (ref 3.5–5.1)
Sodium: 136 mmol/L (ref 135–145)
TCO2: 27 mmol/L (ref 22–32)

## 2020-07-04 LAB — RAPID URINE DRUG SCREEN, HOSP PERFORMED
Amphetamines: NOT DETECTED
Barbiturates: NOT DETECTED
Benzodiazepines: NOT DETECTED
Cocaine: NOT DETECTED
Opiates: POSITIVE — AB
Tetrahydrocannabinol: NOT DETECTED

## 2020-07-04 LAB — MAGNESIUM: Magnesium: 1.8 mg/dL (ref 1.7–2.4)

## 2020-07-04 LAB — MRSA PCR SCREENING: MRSA by PCR: NEGATIVE

## 2020-07-04 LAB — GLUCOSE, CAPILLARY: Glucose-Capillary: 133 mg/dL — ABNORMAL HIGH (ref 70–99)

## 2020-07-04 LAB — PROTIME-INR
INR: 1 (ref 0.8–1.2)
Prothrombin Time: 13.1 seconds (ref 11.4–15.2)

## 2020-07-04 LAB — PHOSPHORUS: Phosphorus: 5.7 mg/dL — ABNORMAL HIGH (ref 2.5–4.6)

## 2020-07-04 LAB — ETHANOL: Alcohol, Ethyl (B): <10 mg/dL (ref <10)

## 2020-07-04 MED ORDER — HEPARIN (PORCINE) 25000 UT/250ML-% IV SOLN
600.0000 [IU]/h | INTRAVENOUS | Status: DC
  Filled 2020-07-04: qty 250

## 2020-07-04 MED ORDER — ACETAMINOPHEN 10 MG/ML IV SOLN
1000.0000 mg | Freq: Four times a day (QID) | INTRAVENOUS | Status: AC | PRN
  Administered 2020-07-04 – 2020-07-05 (×2): 1000 mg via INTRAVENOUS
  Filled 2020-07-04 (×2): qty 100

## 2020-07-04 MED ORDER — SPIRONOLACTONE 12.5 MG HALF TABLET
12.5000 mg | ORAL_TABLET | Freq: Every day | ORAL | Status: DC
  Administered 2020-07-05 – 2020-07-10 (×6): 12.5 mg via ORAL
  Filled 2020-07-04 (×7): qty 1

## 2020-07-04 MED ORDER — ENOXAPARIN SODIUM 30 MG/0.3ML ~~LOC~~ SOLN
30.0000 mg | SUBCUTANEOUS | Status: DC
  Administered 2020-07-04 – 2020-07-09 (×5): 30 mg via SUBCUTANEOUS
  Filled 2020-07-04 (×5): qty 0.3

## 2020-07-04 MED ORDER — SODIUM CHLORIDE 0.9 % IV SOLN
3.0000 g | Freq: Once | INTRAVENOUS | Status: AC
  Administered 2020-07-04: 3 g via INTRAVENOUS
  Filled 2020-07-04: qty 8

## 2020-07-04 MED ORDER — PANTOPRAZOLE SODIUM 40 MG PO TBEC
40.0000 mg | DELAYED_RELEASE_TABLET | Freq: Two times a day (BID) | ORAL | Status: DC
  Administered 2020-07-05 – 2020-07-10 (×11): 40 mg via ORAL
  Filled 2020-07-04 (×11): qty 1

## 2020-07-04 MED ORDER — VITAMIN B-12 100 MCG PO TABS
100.0000 ug | ORAL_TABLET | Freq: Every day | ORAL | Status: DC
  Administered 2020-07-05 – 2020-07-10 (×6): 100 ug via ORAL
  Filled 2020-07-04 (×6): qty 1

## 2020-07-04 MED ORDER — ONDANSETRON 4 MG PO TBDP
4.0000 mg | ORAL_TABLET | Freq: Three times a day (TID) | ORAL | Status: DC | PRN
  Administered 2020-07-05 – 2020-07-07 (×3): 4 mg via ORAL
  Filled 2020-07-04 (×3): qty 1

## 2020-07-04 MED ORDER — ROSUVASTATIN CALCIUM 5 MG PO TABS
5.0000 mg | ORAL_TABLET | ORAL | Status: DC
  Administered 2020-07-05 – 2020-07-08 (×2): 5 mg via ORAL
  Filled 2020-07-04 (×3): qty 1

## 2020-07-04 MED ORDER — ASPIRIN EC 81 MG PO TBEC
81.0000 mg | DELAYED_RELEASE_TABLET | Freq: Every day | ORAL | Status: DC
  Administered 2020-07-05 – 2020-07-10 (×6): 81 mg via ORAL
  Filled 2020-07-04 (×6): qty 1

## 2020-07-04 MED ORDER — HEPARIN BOLUS VIA INFUSION
3000.0000 [IU] | Freq: Once | INTRAVENOUS | Status: DC
  Filled 2020-07-04: qty 3000

## 2020-07-04 MED ORDER — SODIUM CHLORIDE 0.9 % IV BOLUS
1000.0000 mL | Freq: Once | INTRAVENOUS | Status: AC
  Administered 2020-07-04: 1000 mL via INTRAVENOUS

## 2020-07-04 MED ORDER — DOCUSATE SODIUM 100 MG PO CAPS: 100.0000 mg | ORAL_CAPSULE | Freq: Two times a day (BID) | ORAL | Status: DC | PRN

## 2020-07-04 MED ORDER — FUROSEMIDE 10 MG/ML IJ SOLN
80.0000 mg | Freq: Once | INTRAMUSCULAR | Status: AC
  Administered 2020-07-04: 80 mg via INTRAVENOUS
  Filled 2020-07-04: qty 8

## 2020-07-04 MED ORDER — IPRATROPIUM-ALBUTEROL 0.5-2.5 (3) MG/3ML IN SOLN
3.0000 mL | RESPIRATORY_TRACT | Status: DC
  Administered 2020-07-05: 3 mL via RESPIRATORY_TRACT
  Filled 2020-07-04: qty 3

## 2020-07-04 MED ORDER — FOLIC ACID 1 MG PO TABS
1.0000 mg | ORAL_TABLET | Freq: Every day | ORAL | Status: DC
  Administered 2020-07-05 – 2020-07-10 (×6): 1 mg via ORAL
  Filled 2020-07-04 (×6): qty 1

## 2020-07-04 MED ORDER — POLYETHYLENE GLYCOL 3350 17 G PO PACK: 17.0000 g | PACK | Freq: Every day | ORAL | Status: DC | PRN

## 2020-07-04 NOTE — ED Provider Notes (Addendum)
[facility] EMERGENCY DEPARTMENT Provider Note   CSN: [id number] Arrival date & time: 07/04/20  1455     History Chief Complaint  Patient presents with  . Altered Mental Status    Denise King is a 69 y.o. female.  Patient is a 69 year old female that presents with altered mental status.  Per patient's daughter at around 2 PM today patient received a call from her sister that the patient could not be woken up and had vomited.  Patient's daughter does state that one of the other family members gave her CPR at her home for a few seconds per recommendations of the 911 operator to which the patient quickly awoke and the CPR was discontinued. Patient's daughter states that for the past 2-day the patient had been complaining of some dizziness and had some increased jerking in her bilateral hands that appeared as though her hands were "shaking" make it difficult to hold her phone.  Patient's daughter states that the patient has a chronic lean to the right at baseline but over the past 2 days has had a significantly increased lean to the right side causing her to have difficulty with balance.  Patient's daughter states she does not believe the patient has had any new medications and has been out of her oxycodone so she does not think she has overdosed on anything.         Past Medical History:  Diagnosis Date  . Acute cystitis   . Acute respiratory failure ([place])   . Allergy    as a child grew out of them  . Anemia    pernicious anemia  . Anxiety   . Arthritis   . Back pain   . Blood transfusion   . CAP (community acquired pneumonia)   . CHF (congestive heart failure) ([place])   . Chronic pain syndrome   . Common bile duct stone   . Depression   . Diverticulosis of colon   . Duodenal ulcer hemorrhagic-after biliary sphincterotomy   . Dysthymia   . Esophagitis   . EtOH dependence ([place])   . Gastritis   . GERD (gastroesophageal reflux disease)   . H/O chest pain  Dec. 2013   no work up done  . Hx of colonic polyps   . Hypertension   . Irritable bowel syndrome   . Lymphocytic colitis 02/16/2020  . Osteopenia   . Osteoporosis   . Pneumonia 2019  . Polypharmacy   . Reflux esophagitis   . Right sided sciatica 12/22/2017  . Tobacco use disorder   . Unspecified chronic bronchitis ([place])   . Vertigo   . Vitamin B12 deficiency   . Wears glasses     Patient Active Problem List   Diagnosis Date Noted  . Choledocholithiasis 02/22/2020  . Lymphocytic colitis 02/16/2020  . Duodenal ulcer hemorrhagic   . Malnutrition of moderate degree 01/01/2020  . Acute respiratory failure with hypoxia and hypercapnia ([place]) 12/28/2019  . Bipolar disorder ([place]) 12/28/2019  . Alcohol abuse 12/28/2019  . Acute on chronic diastolic CHF (congestive heart failure) ([place]) 12/28/2019  . Abnormal MRI of abdomen   . Cholelithiasis   . Common bile duct stone 12/23/2019  . Acute cholecystitis 12/23/2019  . Polycythemia 09/11/2019  . Right knee pain 08/09/2019  . Quadriceps contusion 08/09/2019  . Abnormal cardiac CT angiography 05/11/2019  . Chest pain 04/11/2019  . Acute viral syndrome 04/11/2019  . Tobacco abuse 04/11/2019  . Hyperphosphatemia 04/11/2019  . SOB (shortness of  breath) 04/07/2019  . Hip fracture ([place]) 10/01/2018  . Dysuria 05/20/2018  . Right leg swelling 05/20/2018  . CAP (community acquired pneumonia) 05/13/2018  . Tachycardia 05/13/2018  . Chest wall pain 05/13/2018  . Narcotic poisoning ([place]) 05/13/2018  . EtOH dependence ([place])   . Polypharmacy   . Esophagitis   . Preventative health care 12/22/2017  . Erythrocytosis 12/22/2017  . Depression 12/22/2017  . Hyperglycemia 12/22/2017  . Right sided sciatica 12/22/2017  . Cough 06/10/2017  . Family history of colon cancer - brother and father 03/10/2016  . Insomnia 02/11/2016  . Generalized anxiety disorder 11/25/2015  . Urinary frequency 04/25/2015  . Nausea and vomiting in adult 11/08/2014   . Cigarette smoker 08/15/2013  . Otitis media 10/27/2012  . Abdominal pain 03/30/2012  . HEPATIC CYST 02/18/2010  . Chronic pain syndrome 02/08/2010  . Vitamin B12 deficiency 10/04/2009  . GERD 01/04/2009  . ABDOMINAL PAIN-EPIGASTRIC 01/04/2009  . DYSTHYMIA 08/10/2008  . Venous (peripheral) insufficiency 08/10/2008  . Irritable bowel syndrome 08/10/2008  . Back pain 08/10/2008  . CIGARETTE SMOKER 03/06/2008  . Hx of adenomatous polyp of colon 12/06/2007  . BRONCHITIS, RECURRENT 12/06/2007  . DIVERTICULOSIS OF COLON 12/06/2007  . OSTEOPENIA 12/06/2007  . CALCULUS, KIDNEY 10/07/2007  . VERTIGO 10/07/2007  . Headache(784.0) 10/07/2007    Past Surgical History:  Procedure Laterality Date  . APPENDECTOMY    . BACK SURGERY  10-09   Dr. Patrice Paradise  . BIOPSY  02/14/2020   Procedure: BIOPSY;  Surgeon: Gatha Mayer, MD;  Location: [facility];  Service: Endoscopy;;  . CARPAL TUNNEL RELEASE Right 08/14/2014   Procedure: RIGHT CARPAL TUNNEL RELEASE AND INJECT LEFT THUMB;  Surgeon: Daryll Brod, MD;  Location: [facility];  Service: Orthopedics;  Laterality: Right;  . CHOLECYSTECTOMY N/A 02/22/2020   Procedure: LAPAROSCOPIC CHOLECYSTECTOMY WITH INTRAOPERATIVE CHOLANGIOGRAM;  Surgeon: Kieth Brightly Arta Bruce, MD;  Location: [facility];  Service: General;  Laterality: N/A;  . COLONOSCOPY    . COLONOSCOPY WITH PROPOFOL N/A 02/14/2020   Procedure: COLONOSCOPY WITH PROPOFOL;  Surgeon: Gatha Mayer, MD;  Location: [facility];  Service: Endoscopy;  Laterality: N/A;  . ENDOSCOPIC RETROGRADE CHOLANGIOPANCREATOGRAPHY (ERCP) WITH PROPOFOL N/A 12/25/2019   Procedure: ENDOSCOPIC RETROGRADE CHOLANGIOPANCREATOGRAPHY (ERCP) WITH PROPOFOL;  Surgeon: Gatha Mayer, MD;  Location: [facility];  Service: Gastroenterology;  Laterality: N/A;  . ESOPHAGOGASTRODUODENOSCOPY (EGD) WITH PROPOFOL N/A 01/06/2020   Procedure: ESOPHAGOGASTRODUODENOSCOPY (EGD) WITH PROPOFOL;  Surgeon: Irene Shipper, MD;  Location:  [facility];  Service: Endoscopy;  Laterality: N/A;  . HOT HEMOSTASIS N/A 01/06/2020   Procedure: HOT HEMOSTASIS (ARGON PLASMA COAGULATION/BICAP);  Surgeon: Irene Shipper, MD;  Location: [facility] ENDOSCOPY;  Service: Endoscopy;  Laterality: N/A;  . INTRAMEDULLARY (IM) NAIL INTERTROCHANTERIC Right 10/01/2018   Procedure: INTRAMEDULLARY (IM) NAIL INTERTROCHANTRIC;  Surgeon: Thornton Park, MD;  Location: [facility] ORS;  Service: Orthopedics;  Laterality: Right;  . LAPAROSCOPY N/A 02/21/2013   Procedure: LAPAROSCOPY OPERATIVE;  Surgeon: Margarette Asal, MD;  [facility];  Service: Gynecology;  Laterality: N/A;  REQUESTING 5MM SCOPE WITH CAMERA  . LEFT HEART CATH AND CORONARY ANGIOGRAPHY N/A 05/11/2019   Procedure: LEFT HEART CATH AND CORONARY ANGIOGRAPHY;  Surgeon: Sherren Mocha, MD;  Location: [facility];  Service: Cardiovascular;  Laterality: N/A;  . REMOVAL OF STONES  12/25/2019   Procedure: Karlyn Agee;  Surgeon: Gatha Mayer, MD;  Location: [facility] ENDOSCOPY;  Service: Gastroenterology;;  . Clide Deutscher  01/06/2020   Procedure: Clide Deutscher;  Surgeon: Henrene Pastor,  Docia Chuck, MD;  Location: [facility] ENDOSCOPY;  Service: Endoscopy;;  . Joan Mayans  12/25/2019   Procedure: Joan Mayans;  Surgeon: Gatha Mayer, MD;  Location: [facility];  Service: Gastroenterology;;  . TONSILLECTOMY    . TUBAL LIGATION    . UPPER GASTROINTESTINAL ENDOSCOPY       OB History   No obstetric history on file.     Family History  Problem Relation Age of Onset  . Colon cancer Father   . Prostate cancer Father   . Heart disease Mother        prev MVR, also has DJD  . Colon cancer Brother   . Skin cancer Sister   . Alcohol abuse Daughter   . Bipolar disorder Daughter   . Drug abuse Daughter   . Esophageal cancer Neg Hx   . Rectal cancer Neg Hx   . Stomach cancer Neg Hx     Social History   Tobacco Use  . Smoking status: Current Every Day Smoker    Packs/day: 1.00    Years: 30.00    Pack years:  30.00    Types: Cigarettes  . Smokeless tobacco: Never Used  Vaping Use  . Vaping Use: Never used  Substance Use Topics  . Alcohol use: Yes    Alcohol/week: 3.0 standard drinks    Types: 3 Standard drinks or equivalent per week    Comment: occasional  . Drug use: No    Home Medications Prior to Admission medications   Medication Sig Start Date End Date Taking? Authorizing Provider  ARIPiprazole (ABILIFY) 2 MG tablet Take 1 tablet (2 mg total) by mouth daily. 05/17/20 08/15/20 Yes Pucilowski, Olgierd A, MD  budesonide (ENTOCORT EC) 3 MG 24 hr capsule TAKE 3 CAPSULES ONCE DAILY. Patient taking differently: Take 9 mg by mouth daily.  05/15/20  Yes Gatha Mayer, MD  buPROPion [facility] SR) 150 MG 12 hr tablet Take 1 tablet (150 mg total) by mouth daily. 06/19/20 08/18/20 Yes Pucilowski, Olgierd A, MD  busPIRone (BUSPAR) 15 MG tablet Take 1 tablet (15 mg total) by mouth 2 (two) times daily. 07/15/20 10/13/20 Yes Pucilowski, Marchia Bond, MD  Cholecalciferol (VITAMIN D) 50 MCG (2000 UT) tablet Take 2,000 Units by mouth daily after breakfast.    Yes [provider]  denosumab (PROLIA) 60 MG/ML SOSY injection Inject 60 mg into the skin every 6 (six) months.   Yes [provider]  folic acid (FOLVITE) 1 MG tablet Take 1 tablet (1 mg total) by mouth daily. Patient taking differently: Take 1 mg by mouth daily after breakfast.  09/05/18  Yes Biagio Borg, MD  morphine (MS CONTIN) 60 MG 12 hr tablet Take 60 mg by mouth every 12 (twelve) hours. 05/02/19  Yes [provider]  nitroGLYCERIN (NITROSTAT) 0.4 MG SL tablet Place 1 tablet (0.4 mg total) under the tongue every 5 (five) minutes as needed for chest pain. 04/22/20  Yes Josue Hector, MD  Oxycodone HCl 10 MG TABS Take 10 mg by mouth 4 (four) times daily.  03/06/19  Yes [provider]  pantoprazole (PROTONIX) 40 MG tablet TAKE (1) TABLET TWICE A DAY BEFORE MEALS. Patient taking differently: Take 40 mg by mouth 2 (two)  times daily with a meal.  05/02/20  Yes Gatha Mayer, MD  rosuvastatin (CRESTOR) 5 MG tablet Take 1 tablet (5 mg total) by mouth every Monday, Wednesday, and Friday at 6 PM. 08/30/19 02/07/21 Yes Josue Hector, MD  sertraline (ZOLOFT) 100  MG tablet Take 2 tablets (200 mg total) by mouth at bedtime. 05/17/20 08/15/20 Yes Pucilowski, Olgierd A, MD  spironolactone (ALDACTONE) 25 MG tablet Take 0.5 tablets (12.5 mg total) by mouth daily. 04/22/20  Yes Josue Hector, MD  vitamin B-12 (CYANOCOBALAMIN) 100 MCG tablet Take 1 tablet (100 mcg total) by mouth daily. Patient taking differently: Take 100 mcg by mouth daily after breakfast.  11/03/18  Yes Biagio Borg, MD  ALPRAZolam Duanne Moron) 0.25 MG tablet Take 1 tablet (0.25 mg total) by mouth 3 (three) times daily as needed for anxiety. 03/20/20   Gatha Mayer, MD  aspirin EC 81 MG tablet Take 1 tablet (81 mg total) by mouth daily. 04/22/20   Josue Hector, MD  gabapentin (NEURONTIN) 300 MG capsule Take 300 mg by mouth 3 (three) times daily.    [provider]  hyoscyamine (ANASPAZ) 0.125 MG TBDP disintergrating tablet Place 0.125 mg under the tongue every 6 (six) hours as needed ([name] cramps).     [provider]  Multiple Vitamin (MULTIVITAMIN WITH MINERALS) TABS tablet Take 1 tablet by mouth daily. 01/05/20   Swayze, Ava, DO  nicotine (NICODERM CQ - DOSED IN MG/24 HOURS) 21 mg/24hr patch Place 1 patch (21 mg total) onto the skin daily. 01/05/20   Swayze, Ava, DO  valACYclovir (VALTREX) 500 MG tablet Take 500 mg by mouth 2 (two) times daily as needed (flare ups).    [provider]    Allergies    Atorvastatin, Levofloxacin, Omnicef [cefdinir], Trazodone and nefazodone, Sulfa antibiotics, and Sulfonamide derivatives  Review of Systems   Review of Systems  Unable to perform ROS: Mental status change    Physical Exam Updated Vital Signs BP 112/69   Pulse 98   Temp (!) 100.4 F (38 C) (Rectal)   Resp (!) 27   SpO2 99%    Physical Exam Constitutional:      Comments: Patient somnolent, awakes to speech, shaking briefly before falling asleep  HENT:     Head: Normocephalic and atraumatic.     Mouth/Throat:     Mouth: Mucous membranes are dry.  Eyes:     Pupils: Pupils are equal, round, and reactive to light.     Comments: Patient noncompliant with extraocular movement  Cardiovascular:     Rate and Rhythm: Regular rhythm. Tachycardia present.  Pulmonary:     Breath sounds: Rales (faint in lower left field) present.     Comments: BiPAP in place Abdominal:     General: Abdomen is flat. There is no distension.     Palpations: Abdomen is soft.     Tenderness: There is no abdominal tenderness.  Skin:    General: Skin is warm and dry.     Capillary Refill: Capillary refill takes 2 to 3 seconds.  Neurological:     Comments: Patient briefly awakens to shaking and speech, states she is in the hospital but does not respond when asked why.  Patient does respond to the question if she has pain stating "no".  Patient noncompliant with neurological exam     ED Results / Procedures / Treatments   Labs (all labs ordered are listed, but only abnormal results are displayed) Labs Reviewed  COMPREHENSIVE METABOLIC PANEL - Abnormal; Notable for the following components:      Result Value   Glucose, Bld 117 (*)    Creatinine, Ser 1.99 (*)    Calcium 8.4 (*)    AST 76 (*)    Total Bilirubin  1.4 (*)    GFR calc non Af Amer 25 (*)    GFR calc Af Amer 29 (*)    Anion gap 16 (*)    All other components within normal limits  CBC WITH DIFFERENTIAL/PLATELET - Abnormal; Notable for the following components:   WBC 24.2 (*)    HCT 46.8 (*)    MCV 101.3 (*)    Neutro Abs 20.3 (*)    Monocytes Absolute 2.9 (*)    All other components within normal limits  RAPID URINE DRUG SCREEN, HOSP PERFORMED - Abnormal; Notable for the following components:   Opiates POSITIVE (*)    All other components within normal limits   URINALYSIS, ROUTINE W REFLEX MICROSCOPIC - Abnormal; Notable for the following components:   Color, Urine AMBER (*)    APPearance HAZY (*)    Protein, ur 100 (*)    All other components within normal limits  I-STAT CHEM 8, ED - Abnormal; Notable for the following components:   Chloride 96 (*)    Creatinine, Ser 1.90 (*)    Glucose, Bld 115 (*)    Calcium, Ion 1.00 (*)    Hemoglobin 16.0 (*)    HCT 47.0 (*)    All other components within normal limits  I-STAT ARTERIAL BLOOD GAS, ED - Abnormal; Notable for the following components:   pH, Arterial 7.208 (*)    pCO2 arterial 78.4 (*)    pO2, Arterial 60 (*)    Bicarbonate 31.2 (*)    TCO2 34 (*)    Hemoglobin 15.6 (*)    All other components within normal limits  TROPONIN I (HIGH SENSITIVITY) - Abnormal; Notable for the following components:   Troponin I (High Sensitivity) 469 (*)    All other components within normal limits  SARS CORONAVIRUS 2 BY RT PCR (HOSPITAL ORDER, [facility])  ETHANOL  PROTIME-INR  BLOOD GAS, ARTERIAL  BRAIN NATRIURETIC PEPTIDE  HEPARIN LEVEL (UNFRACTIONATED)  TROPONIN I (HIGH SENSITIVITY)    EKG EKG Interpretation  Date/Time:  Thursday July 04 2020 14:59:22 EDT Ventricular Rate:  113 PR Interval:    QRS Duration: 131 QT Interval:  327 QTC Calculation: 449 R Axis:   90 Text Interpretation: Sinus tachycardia Consider right atrial enlargement RBBB and LPFB Since last tracing rate faster Confirmed by Wandra Arthurs [phone]) on 07/04/2020 3:20:18 PM   Radiology CT Head Wo Contrast  Result Date: 07/04/2020 CLINICAL DATA:  Altered mental status EXAM: CT HEAD WITHOUT CONTRAST TECHNIQUE: Contiguous axial images were obtained from the base of the skull through the vertex without intravenous contrast. COMPARISON:  09/10/2019 FINDINGS: Brain: No evidence of acute infarction, hemorrhage, hydrocephalus, extra-axial collection or mass lesion/mass effect. Scattered low-density changes within  the periventricular and subcortical white matter compatible with chronic microvascular ischemic change. Mild diffuse cerebral volume loss. Vascular: Atherosclerotic calcifications involving the large vessels of the skull base. No unexpected hyperdense vessel. Skull: Normal. Negative for fracture or focal lesion. Sinuses/Orbits: No acute finding. Other: None. IMPRESSION: 1. No acute intracranial findings. 2. Chronic microvascular ischemic change and cerebral volume loss. Electronically Signed   By: Davina Poke D.O.   On: 07/04/2020 16:16   DG Chest Port 1 View  Result Date: 07/04/2020 CLINICAL DATA:  Unresponsive, altered mental status EXAM: PORTABLE CHEST 1 VIEW COMPARISON:  12/30/2019 FINDINGS: Rotated AP portable examination with mild cardiomegaly. Mild, diffuse interstitial pulmonary opacity. No focal airspace opacity. IMPRESSION: Rotated AP portable examination with mild cardiomegaly and mild, diffuse interstitial  pulmonary opacity, likely mild edema. No focal airspace opacity. Electronically Signed   By: Eddie Candle M.D.   On: 07/04/2020 15:42    Procedures Procedures (including critical care time)  Medications Ordered in ED Medications  heparin bolus via infusion 3,000 Units (has no administration in time range)  heparin ADULT infusion 100 units/mL (25000 units/241mL sodium chloride 0.45%) (has no administration in time range)  sodium chloride 0.9 % bolus 1,000 mL (1,000 mLs Intravenous New Bag/Given 07/04/20 1530)  Ampicillin-Sulbactam (UNASYN) 3 g in sodium chloride 0.9 % 100 mL IVPB (0 g Intravenous Stopped 07/04/20 1650)    ED Course  I have reviewed the triage vital signs and the nursing notes.  Pertinent labs & imaging results that were available during my care of the patient were reviewed by me and considered in my medical decision making (see chart for details).  Patient with a history of opioid use, bipolar disorder, CHF presenting with altered mental status.  We will plan to  order CBC, BMP, UA, CT head, chest x-ray, troponin, arterial blood gas.  Chest x-ray which showed mild cardiomegaly and mild diffuse interstitial pulmonary opacity which was likely mild edema. ABG was significant for a pH of 7.2 with increased PCO2 of 78.4, calculated bicarb of 31.2 suggesting respiratory acidosis.  Patient was started on BiPAP.  Alcohol negative.  Patient was noted to have an AKI with creatinine elevated at 1.9 baseline appearing approximately 0.75.  Patient received 1 L bolus of normal saline due to AKI and tachycardia.  Due to concern for aspiration patient was started on Unasyn for antibiotic coverage.  Head CT negative for acute abnormality. Initial troponin elevated at 469 for which the patient started on heparin.  Due to new requirement of BiPAP and elevated troponin will admit to ICU.    MDM Rules/Calculators/A&P                          69 year old female with altered mental status likely differential includes aspiration pneumonia, NSTEMI, CVA, medication overdose.  Initial pH of 7.2 with increased CO2 of 78.  Patient started on BiPAP.  Patient with an elevated white cell count of 22,000 and some vomiting suggestive of an aspiration pneumonia.  Chest x-ray without obvious focal lesion.  Head CT negative for acute intracranial abnormality.  Patient with troponin of 469.  Consultant called for admission to ICU due to new requirement of BiPAP and elevated troponin.  5:11 PM  ED course: Patient with a history of opioid use, bipolar disorder, CHF presenting with altered mental status.  We will plan to order CBC, BMP, UA, CT head, chest x-ray, troponin, arterial blood gas.  Chest x-ray which showed mild cardiomegaly and mild diffuse interstitial pulmonary opacity which was likely mild edema. ABG was significant for a pH of 7.2 with increased PCO2 of 78.4, calculated bicarb of 31.2 suggesting respiratory acidosis.  Patient was started on BiPAP.  Alcohol negative.  Patient was noted to  have an AKI with creatinine elevated at 1.9 baseline appearing approximately 0.75.  Patient received 1 L bolus of normal saline due to AKI and tachycardia.  Due to concern for aspiration patient was started on Unasyn for antibiotic coverage.  Head CT negative for acute abnormality. Initial troponin elevated at 469 for which the patient started on heparin.  Due to new requirement of BiPAP and elevated troponin will admit to ICU.  Final Clinical Impression(s) / ED Diagnoses Final diagnoses:  Elevated troponin  Aspiration pneumonia, unspecified aspiration pneumonia type, unspecified laterality, unspecified part of lung [facility])    Rx / DC Orders ED Discharge Orders    None       Lurline Del, DO 07/04/20 [place], DO 07/04/20 1859    Drenda Freeze, MD 07/08/20 [phone]

## 2020-07-04 NOTE — Progress Notes (Signed)
ANTICOAGULATION CONSULT NOTE - Initial Consult  Pharmacy Consult for Heparin Indication: chest pain/ACS  Allergies  Allergen Reactions  . Atorvastatin Nausea And Vomiting  . Levofloxacin Other (See Comments)    Reaction: achilles tendon pain  . Omnicef [Cefdinir] Diarrhea    Uncontrollable diarrhea  . Trazodone And Nefazodone Other (See Comments)    "Felt like I was going to faint"  . Sulfa Antibiotics Rash  . Sulfonamide Derivatives Rash    Patient Measurements: TBW: 50.45kg Heparin Dosing Weight: 50.45kg  Vital Signs: BP: 114/86 (07/08 1600) Pulse Rate: 106 (07/08 1600)  Labs: Recent Labs    07/04/20 1501 07/04/20 1501 07/04/20 1518 07/04/20 1521  HGB 14.5   < > 15.6* 16.0*  HCT 46.8*  --  46.0 47.0*  PLT 267  --   --   --   LABPROT 13.1  --   --   --   INR 1.0  --   --   --   CREATININE 1.99*  --   --  1.90*  TROPONINIHS 469*  --   --   --    < > = values in this interval not displayed.    CrCl cannot be calculated (Unknown ideal weight.).   Medical History: Past Medical History:  Diagnosis Date  . Acute cystitis   . Acute respiratory failure ([place])   . Allergy    as a child grew out of them  . Anemia    pernicious anemia  . Anxiety   . Arthritis   . Back pain   . Blood transfusion   . CAP (community acquired pneumonia)   . CHF (congestive heart failure) ([place])   . Chronic pain syndrome   . Common bile duct stone   . Depression   . Diverticulosis of colon   . Duodenal ulcer hemorrhagic-after biliary sphincterotomy   . Dysthymia   . Esophagitis   . EtOH dependence ([place])   . Gastritis   . GERD (gastroesophageal reflux disease)   . H/O chest pain Dec. 2013   no work up done  . Hx of colonic polyps   . Hypertension   . Irritable bowel syndrome   . Lymphocytic colitis 02/16/2020  . Osteopenia   . Osteoporosis   . Pneumonia 2019  . Polypharmacy   . Reflux esophagitis   . Right sided sciatica 12/22/2017  . Tobacco use disorder   .  Unspecified chronic bronchitis ([place])   . Vertigo   . Vitamin B12 deficiency   . Wears glasses     Assessment: Patient is a 69yo female who presents with AMS. Troponin elevated at 469. Hg & Plt WNL, HCT 46.8.  Goal of Therapy:  Heparin level 0.3-0.7 units/ml Monitor platelets by anticoagulation protocol: Yes   Plan:  Give 3000 units bolus x 1 then give 600 units/hr. Check 8 hour HL. Monitor daily HL, CBC, & s/sx of bleeding.  Beckey Rutter 07/04/2020,4:36 PM

## 2020-07-04 NOTE — ED Triage Notes (Addendum)
Pt arrives from home with LSN at 0600 by family, pt was found by family at 2pm to be unresponsive family actually began cpr and this caused pt to open eyes. Pt is alert to voice, incomprehensive speech. Pt does have chronic pain and is taking morphine for this.

## 2020-07-04 NOTE — Progress Notes (Signed)
Patient admitted to 3M03 at 1932 with ED nurse and RT present. Patient oriented to all but time. Able to follow simple commands, moves all extremities. Notified e-link patient was admitted. Patient's daughter Royal Hawthorn at bedside.

## 2020-07-04 NOTE — Progress Notes (Signed)
[facility] Progress Note Patient Name: Denise King DOB: April 14, 1951 MRN: [id number]   Date of Service  07/04/2020  HPI/Events of Note  Patient admitted to ICU with acute on chronic hypercapnic respiratory failure and altered mental status which has improved with BIPAP, she has a leukocytosis without fever of unclear etiology, CXR without distinct consolidation / infiltrate, and U?A not suggestive of infection, patient is on empiric antibiotics pending culture results. Brain imaging was negative for acute findings.  eICU Interventions  New Patient Evaluation completed.        Kerry Kass Leesha Veno 07/04/2020, 8:27 PM

## 2020-07-04 NOTE — H&P (Addendum)
NAME:  Denise King, MRN:  [id number], DOB:  [birth year], LOS: 0 ADMISSION DATE:  07/04/2020, CONSULTATION DATE:  07/04/2020 REFERRING MD:  Calvert Cantor CHIEF COMPLAINT:  Altered mental status   Brief History   AMS 2/2 acute hypercarbic respiratory failure  History of present illness   Denise King is a 69 year female who presents with altered mental status. Patient's  Daughter is bedside and provides a history.  Reports patient presented to the ED last month with some nausea and vomiting. She was given Zofran. She was in her normal state of health until 2 days ago and she reports patient was leaning to her right side, her hands were shaky, and she was experiencing some dizziness.. Patient had no facial droop , slurred speech or  focal deficits at this time according to her daughter and thought the leaning was  secondary to chronic sciatic pain and chronic pain from right femur fracture in October 2019.   Acute event happen this morning when her other daughter found her vomiting. She seemed altered and was helped back to bed. She slept until 2 pm today and could not be woken up initially. CPR was attempted and patient woke up. EMS found her in bed with emesis. Noted not to have any focal deficits and responding to verbal stimuli.  Patient is on Bipap during my exam , but responds to verbal stimuli.  Past Medical History   Past medical history including not limited to depression, Crohn's disease, alcohol depenence, tobacco use disorder , and HFpEF  Significant Hospital Events   7/8 Hypercarbic on ABG and placed on Bipap  Consults:  N/A  Procedures:  N/A  Significant Diagnostic Tests:  7/8 CT Head wo Contrast - no acute intracranial findings. Chronic microvascular ischemic changes and acerebral volume loss.   Micro Data:  Linus [place] Coronavirus  Antimicrobials:  n/a   Interim history/subjective:    Objective   Blood pressure (!) 88/66, pulse 90, temperature (!) 100.4 F (38  C), temperature source Rectal, resp. rate (!) 21, SpO2 97 %.        Intake/Output Summary (Last 24 hours) at 07/04/2020 1741 Last data filed at 07/04/2020 1650 Gross per 24 hour  Intake 100 ml  Output --  Net 100 ml   There were no vitals filed for this visit.  Examination: General: ill appearing elderly female, on bipap HENT: [place]/AT, antiicteric sclera, trachea midline Lungs: On BiPAP, rhochorous breath sounds bilateral, no rales or wheezes  Cardiovascular: RRR,  No murmurs rubs or gallops Abdomen: soft, non distended  Extremities: no lower extremity edema , no deformities  Neuro: altered, EOMI, pupils reactive, withdrawals to painful stimuli GU: pure wick   Resolved Hospital Problem list     Assessment & Plan:   Denise King is a 69 year old female with past medical history including but not limited to depression, Crohn's disease, alcohol depenence, tobacco use disorder , and HFpEF who presents with altered mental status (AMS).  AMS Acute hypercarbic respiratory failure Acute on chronic heart failure  Patient presents with altered mental status and ABG shows primary respiratory acidosis. A Pulmonary edema on chest xray. BNP elevated .Last echo 2/04 with EF 60-65% with grade 2 diastolic dysfunction. Last heart cath 04/2019 without any stenosis significant enough for PCI.  Initial troponin 469 , will trend and believe this is likely from demand . CT Head reassuring. Electrolytes normal. Infection could also contribute for AMS. Infectious workup as below.Patient has multiple centrally acting medications which  also could be contributing.  Plan: - Bipap -Follow up ABG  - Lasix 80 mg once  -Strict ins and outs - daily weights  - Limit centrally acting medications  - Trend troponin - BMP, Mg ,Phos    Leukocytosis Blood cell count 24, neutrophil predominance. Fever to 100.4 patient found with emesis with altered mental state.  No focal opacities on chest x-ray but could have  aspirated.  AMS is more likely from hypercarbia which should improved on Bipap as above. Givenl 1 dose of Unasyn in the ED. Plan: -Holding antibiotics -Trend CBC -Trend fever -Follow-up UA -Follow blood cultures  AKI Baseline patient has normal kidney function. , baseline Cr around .75 , elevated to 1.9 on admission. Likely from acute on chronic heart failure as mentioned above Plan: -Trend Cr  Depression  Chronic Pain -Held Wellbutrin, Zoloft, oxycodone, morphine, gabapentin, xanax  - Can consider restarting medications as mental status improves   Alcohol use disorder History of alcohol use reported in chart.  Ethanol negative on admission. Plan: CIWA without Ativan  Best practice:  Diet: NPO Pain/Anxiety/Delirium protocol (if indicated):  VAP protocol (if indicated):  DVT prophylaxis: Lovenox GI prophylaxis: Protonix Glucose control: n/a Mobility: Bed rest Code Status: Full Family Communication: Patient daughter updated bedside by Dr.Shaneese Tait ( resident) and Dr.Arguwala.  Disposition: ICU  Labs   CBC: Recent Labs  Lab 07/04/20 1501 07/04/20 1518 07/04/20 1521 07/04/20 1728  WBC 24.2*  --   --   --   NEUTROABS 20.3*  --   --   --   HGB 14.5 15.6* 16.0* 15.0  HCT 46.8* 46.0 47.0* 44.0  MCV 101.3*  --   --   --   PLT 267  --   --   --     Basic Metabolic Panel: Recent Labs  Lab 07/04/20 1501 07/04/20 1518 07/04/20 1521 07/04/20 1728  NA 136 137 136 139  K 4.9 3.9 4.2 3.8  CL 98  --  96*  --   CO2 22  --   --   --   GLUCOSE 117*  --  115*  --   BUN 13  --  17  --   CREATININE 1.99*  --  1.90*  --   CALCIUM 8.4*  --   --   --    GFR: CrCl cannot be calculated (Unknown ideal weight.). Recent Labs  Lab 07/04/20 1501  WBC 24.2*    Liver Function Tests: Recent Labs  Lab 07/04/20 1501  AST 76*  ALT 24  ALKPHOS 63  BILITOT 1.4*  PROT 6.6  ALBUMIN 3.7   No results for input(s): LIPASE, AMYLASE in the last 168 hours. No results for input(s):  AMMONIA in the last 168 hours.  ABG    Component Value Date/Time   PHART 7.197 (LL) 07/04/2020 1728   PCO2ART 78.4 (HH) 07/04/2020 1728   PO2ART 61 (L) 07/04/2020 1728   HCO3 30.5 (H) 07/04/2020 1728   TCO2 33 (H) 07/04/2020 1728   O2SAT 83.0 07/04/2020 1728     Coagulation Profile: Recent Labs  Lab 07/04/20 1501  INR 1.0    Cardiac Enzymes: No results for input(s): CKTOTAL, CKMB, CKMBINDEX, TROPONINI in the last 168 hours.  HbA1C: Hgb A1c MFr Bld  Date/Time Value Ref Range Status  10/01/2018 02:53 AM 5.2 4.8 - 5.6 % Final    Comment:    (NOTE)         Prediabetes: 5.7 - 6.4  Diabetes: >6.4         Glycemic control for adults with diabetes: <7.0   12/22/2017 04:50 PM 4.9 4.6 - 6.5 % Final    Comment:    Glycemic Control Guidelines for People with Diabetes:Non Diabetic:  <6%Goal of Therapy: <7%Additional Action Suggested:  >8%     CBG: No results for input(s): GLUCAP in the last 168 hours.  Review of Systems:   Unable to complete due to patients mental status   Past Medical History  She,  has a past medical history of Acute cystitis, Acute respiratory failure ([place]), Allergy, Anemia, Anxiety, Arthritis, Back pain, Blood transfusion, CAP (community acquired pneumonia), CHF (congestive heart failure) ([place]), Chronic pain syndrome, Common bile duct stone, Depression, Diverticulosis of colon, Duodenal ulcer hemorrhagic-after biliary sphincterotomy, Dysthymia, Esophagitis, EtOH dependence ([place]), Gastritis, GERD (gastroesophageal reflux disease), H/O chest pain (Dec. 2013), colonic polyps, Hypertension, Irritable bowel syndrome, Lymphocytic colitis (02/16/2020), Osteopenia, Osteoporosis, Pneumonia (2019), Polypharmacy, Reflux esophagitis, Right sided sciatica (12/22/2017), Tobacco use disorder, Unspecified chronic bronchitis ([place]), Vertigo, Vitamin B12 deficiency, and Wears glasses.   Surgical History    Past Surgical History:  Procedure Laterality Date  . APPENDECTOMY     . BACK SURGERY  10-09   Dr. Patrice Paradise  . BIOPSY  02/14/2020   Procedure: BIOPSY;  Surgeon: Gatha Mayer, MD;  Location: [facility];  Service: Endoscopy;;  . CARPAL TUNNEL RELEASE Right 08/14/2014   Procedure: RIGHT CARPAL TUNNEL RELEASE AND INJECT LEFT THUMB;  Surgeon: Daryll Brod, MD;  Location: [facility];  Service: Orthopedics;  Laterality: Right;  . CHOLECYSTECTOMY N/A 02/22/2020   Procedure: LAPAROSCOPIC CHOLECYSTECTOMY WITH INTRAOPERATIVE CHOLANGIOGRAM;  Surgeon: Kieth Brightly Arta Bruce, MD;  Location: [facility];  Service: General;  Laterality: N/A;  . COLONOSCOPY    . COLONOSCOPY WITH PROPOFOL N/A 02/14/2020   Procedure: COLONOSCOPY WITH PROPOFOL;  Surgeon: Gatha Mayer, MD;  Location: [facility];  Service: Endoscopy;  Laterality: N/A;  . ENDOSCOPIC RETROGRADE CHOLANGIOPANCREATOGRAPHY (ERCP) WITH PROPOFOL N/A 12/25/2019   Procedure: ENDOSCOPIC RETROGRADE CHOLANGIOPANCREATOGRAPHY (ERCP) WITH PROPOFOL;  Surgeon: Gatha Mayer, MD;  Location: [facility];  Service: Gastroenterology;  Laterality: N/A;  . ESOPHAGOGASTRODUODENOSCOPY (EGD) WITH PROPOFOL N/A 01/06/2020   Procedure: ESOPHAGOGASTRODUODENOSCOPY (EGD) WITH PROPOFOL;  Surgeon: Irene Shipper, MD;  Location: [facility] ENDOSCOPY;  Service: Endoscopy;  Laterality: N/A;  . HOT HEMOSTASIS N/A 01/06/2020   Procedure: HOT HEMOSTASIS (ARGON PLASMA COAGULATION/BICAP);  Surgeon: Irene Shipper, MD;  Location[facility] END[facility]COPY;  Service: Endoscopy;  Laterality: N/A;  . INTRAMEDULLARY (IM) NAIL INTERTROCHANTERIC Right 10/01/2018   Procedure: INTRAMEDULLARY (IM) NAIL INTERTROCHANTRIC;  Surgeon: Thornton Park, MD;  Location: [facility] ORS;  Service: Orthopedics;  Laterality: Right;  . LAPAROSCOPY N/A 02/21/2013   Procedure: LAPAROSCOPY OPERATIVE;  Surgeon: Margarette Asal, MD;  Location: [facility];  Service: Gynecology;  Laterality: N/A;  REQUESTING 5MM SCOPE WITH CAMERA  . LEFT HEART CATH AND CORONARY ANGIOGRAPHY N/A 05/11/2019   Procedure: LEFT HEART  CATH AND CORONARY ANGIOGRAPHY;  Surgeon: Sherren Mocha, MD;  Location: [facility];  Service: Cardiovascular;  Laterality: N/A;  . REMOVAL OF STONES  12/25/2019   Procedure: Karlyn Agee;  Surgeon: Gatha Mayer, MD;  Location: [facility] ENDOSCOPY;  Service: Gastroenterology;;  . Clide Deutscher  01/06/2020   Procedure: Clide Deutscher;  Surgeon: Irene Shipper, MD;  Location: [facility] ENDOSCOPY;  Service: Endoscopy;;  . Joan Mayans  12/25/2019   Procedure: SPHINCTEROTOMY;  Surgeon: Gatha Mayer, MD;  Location: [facility];  Service: Gastroenterology;;  . TONSILLECTOMY    .  TUBAL LIGATION    . UPPER GASTROINTESTINAL ENDOSCOPY       Social History   reports that she has been smoking cigarettes. She has a 30.00 pack-year smoking history. She has never used smokeless tobacco. She reports current alcohol use of about 3.0 standard drinks of alcohol per week. She reports that she does not use drugs.   Family History   Her family history includes Alcohol abuse in her daughter; Bipolar disorder in her daughter; Colon cancer in her brother and father; Drug abuse in her daughter; Heart disease in her mother; Prostate cancer in her father; Skin cancer in her sister. There is no history of Esophageal cancer, Rectal cancer, or Stomach cancer.   Allergies Allergies  Allergen Reactions  . Atorvastatin Nausea And Vomiting  . Levofloxacin Other (See Comments)    Reaction: achilles tendon pain  . Omnicef [Cefdinir] Diarrhea    Uncontrollable diarrhea  . Trazodone And Nefazodone Other (See Comments)    "Felt like I was going to faint"  . Sulfa Antibiotics Rash  . Sulfonamide Derivatives Rash     Home Medications  Prior to Admission medications   Medication Sig Start Date End Date Taking? Authorizing Provider  ARIPiprazole (ABILIFY) 2 MG tablet Take 1 tablet (2 mg total) by mouth daily. 05/17/20 08/15/20 Yes Pucilowski, Olgierd A, MD  budesonide (ENTOCORT EC) 3 MG 24 hr capsule TAKE 3 CAPSULES ONCE  DAILY. Patient taking differently: Take 9 mg by mouth daily.  05/15/20  Yes Gatha Mayer, MD  buPROPion [facility] SR) 150 MG 12 hr tablet Take 1 tablet (150 mg total) by mouth daily. 06/19/20 08/18/20 Yes Pucilowski, Olgierd A, MD  busPIRone (BUSPAR) 15 MG tablet Take 1 tablet (15 mg total) by mouth 2 (two) times daily. 07/15/20 10/13/20 Yes Pucilowski, Marchia Bond, MD  Cholecalciferol (VITAMIN D) 50 MCG (2000 UT) tablet Take 2,000 Units by mouth daily after breakfast.    Yes [provider]  denosumab (PROLIA) 60 MG/ML SOSY injection Inject 60 mg into the skin every 6 (six) months.   Yes [provider]  folic acid (FOLVITE) 1 MG tablet Take 1 tablet (1 mg total) by mouth daily. Patient taking differently: Take 1 mg by mouth daily after breakfast.  09/05/18  Yes Biagio Borg, MD  morphine (MS CONTIN) 60 MG 12 hr tablet Take 60 mg by mouth every 12 (twelve) hours. 05/02/19  Yes [provider]  nitroGLYCERIN (NITROSTAT) 0.4 MG SL tablet Place 1 tablet (0.4 mg total) under the tongue every 5 (five) minutes as needed for chest pain. 04/22/20  Yes Josue Hector, MD  ondansetron (ZOFRAN-ODT) 4 MG disintegrating tablet Take 4 mg by mouth every 8 (eight) hours as needed for nausea or vomiting.   Yes [provider]  Oxycodone HCl 10 MG TABS Take 10 mg by mouth 4 (four) times daily.  03/06/19  Yes [provider]  pantoprazole (PROTONIX) 40 MG tablet TAKE (1) TABLET TWICE A DAY BEFORE MEALS. Patient taking differently: Take 40 mg by mouth 2 (two) times daily with a meal.  05/02/20  Yes Gatha Mayer, MD  potassium chloride (MICRO-K) 10 MEQ CR capsule Take 10 mEq by mouth 2 (two) times daily.   Yes [provider]  rosuvastatin (CRESTOR) 5 MG tablet Take 1 tablet (5 mg total) by mouth every Monday, Wednesday, and Friday at 6 PM. 08/30/19 02/07/21 Yes Josue Hector, MD  sertraline (ZOLOFT) 100 MG tablet Take 2 tablets (200 mg total) by  mouth at bedtime. 05/17/20  08/15/20 Yes Pucilowski, Olgierd A, MD  spironolactone (ALDACTONE) 25 MG tablet Take 0.5 tablets (12.5 mg total) by mouth daily. 04/22/20  Yes Josue Hector, MD  vitamin B-12 (CYANOCOBALAMIN) 100 MCG tablet Take 1 tablet (100 mcg total) by mouth daily. Patient taking differently: Take 100 mcg by mouth daily after breakfast.  11/03/18  Yes Biagio Borg, MD  ALPRAZolam Duanne Moron) 0.25 MG tablet Take 1 tablet (0.25 mg total) by mouth 3 (three) times daily as needed for anxiety. Patient not taking: Reported on 07/04/2020 03/20/20   Gatha Mayer, MD  aspirin EC 81 MG tablet Take 1 tablet (81 mg total) by mouth daily. 04/22/20   Josue Hector, MD  gabapentin (NEURONTIN) 300 MG capsule Take 300 mg by mouth 3 (three) times daily.    [provider]  hyoscyamine (ANASPAZ) 0.125 MG TBDP disintergrating tablet Place 0.125 mg under the tongue every 6 (six) hours as needed ([name] cramps).     [provider]  Multiple Vitamin (MULTIVITAMIN WITH MINERALS) TABS tablet Take 1 tablet by mouth daily. Patient not taking: Reported on 07/04/2020 01/05/20   Swayze, Ava, DO  nicotine (NICODERM CQ - DOSED IN MG/24 HOURS) 21 mg/24hr patch Place 1 patch (21 mg total) onto the skin daily. Patient not taking: Reported on 07/04/2020 01/05/20   Swayze, Ava, DO  valACYclovir (VALTREX) 500 MG tablet Take 500 mg by mouth 2 (two) times daily as needed (flare ups). Patient not taking: Reported on 07/04/2020    [provider]     Critical care time:      Tamsen Snider, MD PGY2

## 2020-07-04 NOTE — ED Provider Notes (Signed)
MSE was initiated and I personally evaluated the patient and placed orders (if any) at  3:01 PM on July 04, 2020.  The patient arrives from home with AMS. Last Seen Well at 0600 today. Family found her unresponsive at 1430 this afternoon. They did brief CPR but then she opened her eyes. EMS found her in bed with emesis. Responds to pain and verbal stimuli, no focal deficit on brief exam, moves all extremities but does not follow commands.    Truddie Hidden, MD 07/04/20 [phone]

## 2020-07-05 DIAGNOSIS — J9602 Acute respiratory failure with hypercapnia: Secondary | ICD-10-CM

## 2020-07-05 DIAGNOSIS — J9611 Chronic respiratory failure with hypoxia: Secondary | ICD-10-CM

## 2020-07-05 DIAGNOSIS — J9612 Chronic respiratory failure with hypercapnia: Secondary | ICD-10-CM

## 2020-07-05 LAB — BASIC METABOLIC PANEL
Anion gap: 12 (ref 5–15)
BUN: 19 mg/dL (ref 8–23)
CO2: 28 mmol/L (ref 22–32)
Calcium: 7.6 mg/dL — ABNORMAL LOW (ref 8.9–10.3)
Chloride: 98 mmol/L (ref 98–111)
Creatinine, Ser: 1.6 mg/dL — ABNORMAL HIGH (ref 0.44–1.00)
GFR calc Af Amer: 38 mL/min — ABNORMAL LOW (ref >60)
GFR calc non Af Amer: 33 mL/min — ABNORMAL LOW (ref >60)
Glucose, Bld: 104 mg/dL — ABNORMAL HIGH (ref 70–99)
Potassium: 3.5 mmol/L (ref 3.5–5.1)
Sodium: 138 mmol/L (ref 135–145)

## 2020-07-05 LAB — CBC
HCT: 37.1 % (ref 36.0–46.0)
Hemoglobin: 11.7 g/dL — ABNORMAL LOW (ref 12.0–15.0)
MCH: 30.9 pg (ref 26.0–34.0)
MCHC: 31.5 g/dL (ref 30.0–36.0)
MCV: 97.9 fL (ref 80.0–100.0)
Platelets: 195 10*3/uL (ref 150–400)
RBC: 3.79 MIL/uL — ABNORMAL LOW (ref 3.87–5.11)
RDW: 12.1 % (ref 11.5–15.5)
WBC: 20.4 10*3/uL — ABNORMAL HIGH (ref 4.0–10.5)
nRBC: 0 % (ref 0.0–0.2)

## 2020-07-05 LAB — MAGNESIUM: Magnesium: 1.8 mg/dL (ref 1.7–2.4)

## 2020-07-05 LAB — GLUCOSE, CAPILLARY: Glucose-Capillary: 130 mg/dL — ABNORMAL HIGH (ref 70–99)

## 2020-07-05 LAB — PHOSPHORUS: Phosphorus: 5.6 mg/dL — ABNORMAL HIGH (ref 2.5–4.6)

## 2020-07-05 MED ORDER — MORPHINE SULFATE ER 30 MG PO TBCR
30.0000 mg | EXTENDED_RELEASE_TABLET | Freq: Two times a day (BID) | ORAL | Status: DC
  Administered 2020-07-05: 30 mg via ORAL
  Filled 2020-07-05 (×2): qty 1

## 2020-07-05 MED ORDER — MORPHINE SULFATE ER 15 MG PO TBCR: 15.0000 mg | EXTENDED_RELEASE_TABLET | Freq: Two times a day (BID) | ORAL | Status: DC

## 2020-07-05 MED ORDER — IPRATROPIUM-ALBUTEROL 0.5-2.5 (3) MG/3ML IN SOLN
3.0000 mL | Freq: Two times a day (BID) | RESPIRATORY_TRACT | Status: DC
  Filled 2020-07-05: qty 3

## 2020-07-05 MED ORDER — POTASSIUM CHLORIDE CRYS ER 10 MEQ PO TBCR
40.0000 meq | EXTENDED_RELEASE_TABLET | Freq: Once | ORAL | Status: AC
  Administered 2020-07-05: 40 meq via ORAL
  Filled 2020-07-05: qty 4

## 2020-07-05 MED ORDER — FUROSEMIDE 10 MG/ML IJ SOLN
80.0000 mg | Freq: Two times a day (BID) | INTRAMUSCULAR | Status: AC
  Administered 2020-07-05 – 2020-07-06 (×4): 80 mg via INTRAVENOUS
  Filled 2020-07-05 (×4): qty 8

## 2020-07-05 MED ORDER — IPRATROPIUM-ALBUTEROL 0.5-2.5 (3) MG/3ML IN SOLN: 3.0000 mL | RESPIRATORY_TRACT | Status: DC | PRN

## 2020-07-05 MED ORDER — SERTRALINE HCL 100 MG PO TABS: 200.0000 mg | ORAL_TABLET | Freq: Every day | ORAL | Status: DC

## 2020-07-05 MED ORDER — OXYCODONE HCL 5 MG PO TABS
5.0000 mg | ORAL_TABLET | ORAL | Status: DC | PRN
  Administered 2020-07-05: 5 mg via ORAL
  Filled 2020-07-05: qty 1

## 2020-07-05 MED ORDER — ARIPIPRAZOLE 2 MG PO TABS
2.0000 mg | ORAL_TABLET | Freq: Every day | ORAL | Status: DC
  Administered 2020-07-05 – 2020-07-10 (×6): 2 mg via ORAL
  Filled 2020-07-05 (×6): qty 1

## 2020-07-05 MED ORDER — GABAPENTIN 300 MG PO CAPS
300.0000 mg | ORAL_CAPSULE | Freq: Three times a day (TID) | ORAL | Status: DC
  Administered 2020-07-05 (×2): 300 mg via ORAL
  Filled 2020-07-05 (×2): qty 1

## 2020-07-05 MED ORDER — CHLORHEXIDINE GLUCONATE CLOTH 2 % EX PADS
6.0000 | MEDICATED_PAD | Freq: Every day | CUTANEOUS | Status: DC
  Administered 2020-07-05 – 2020-07-10 (×4): 6 via TOPICAL

## 2020-07-05 MED ORDER — GABAPENTIN 100 MG PO CAPS
100.0000 mg | ORAL_CAPSULE | Freq: Three times a day (TID) | ORAL | Status: DC
  Administered 2020-07-06 – 2020-07-10 (×13): 100 mg via ORAL
  Filled 2020-07-05 (×13): qty 1

## 2020-07-05 MED ORDER — SERTRALINE HCL 100 MG PO TABS
200.0000 mg | ORAL_TABLET | Freq: Every day | ORAL | Status: DC
  Administered 2020-07-06 – 2020-07-09 (×4): 200 mg via ORAL
  Filled 2020-07-05 (×4): qty 2

## 2020-07-05 MED ORDER — OXYCODONE HCL 5 MG PO TABS: 5.0000 mg | ORAL_TABLET | Freq: Four times a day (QID) | ORAL | Status: DC | PRN

## 2020-07-05 MED ORDER — ORAL CARE MOUTH RINSE
15.0000 mL | Freq: Two times a day (BID) | OROMUCOSAL | Status: DC
  Administered 2020-07-05 – 2020-07-09 (×8): 15 mL via OROMUCOSAL

## 2020-07-05 MED ORDER — OXYCODONE HCL 5 MG PO TABS
10.0000 mg | ORAL_TABLET | ORAL | Status: DC | PRN
  Administered 2020-07-05 (×2): 10 mg via ORAL
  Filled 2020-07-05 (×2): qty 2

## 2020-07-05 MED ORDER — BUPROPION HCL ER (SR) 150 MG PO TB12
150.0000 mg | ORAL_TABLET | Freq: Every day | ORAL | Status: DC
  Administered 2020-07-05 – 2020-07-10 (×6): 150 mg via ORAL
  Filled 2020-07-05 (×6): qty 1

## 2020-07-05 MED ORDER — CHLORHEXIDINE GLUCONATE 0.12 % MT SOLN
15.0000 mL | Freq: Two times a day (BID) | OROMUCOSAL | Status: DC
  Administered 2020-07-05 – 2020-07-10 (×11): 15 mL via OROMUCOSAL
  Filled 2020-07-05 (×11): qty 15

## 2020-07-05 NOTE — Progress Notes (Signed)
NAME:  Denise King, MRN:  [id number], DOB:  1951/08/11, LOS: 1 ADMISSION DATE:  07/04/2020, CONSULTATION DATE:  07/05/20 REFERRING MD: Calvert Cantor CHIEF COMPLAINT:  AMS and Emesis    Brief History   Denise King is a 69 year old female with pmhx of HFpEF, chronic pain, bipolar disorder, tobacco use, alcohol abuse who presented with AMS and emesis found to have acute on chronic HF exacerbation with hypercarbia requiring Bipap for respiratory support .   History of present illness   Patient had decreased responsiveness at home, CPR was started and patient responded so this was stopped. Patient transported to the hosptial via EMS and ABG was obtained showing respiratory acidosis & hypercarbia for which patient was placed on BiPap. Head imaging showed no acute intracranial abnormalities.   Past Medical History   has a past medical history of Acute cystitis, Acute respiratory failure ([place]), Allergy, Anemia, Anxiety, Arthritis, Back pain, Blood transfusion, CAP (community acquired pneumonia), CHF (congestive heart failure) ([place]), Chronic pain syndrome, Common bile duct stone, Depression, Diverticulosis of colon, Duodenal ulcer hemorrhagic-after biliary sphincterotomy, Dysthymia, Esophagitis, EtOH dependence ([place]), Gastritis, GERD (gastroesophageal reflux disease), H/O chest pain (Dec. 2013), colonic polyps, Hypertension, Irritable bowel syndrome, Lymphocytic colitis (02/16/2020), Osteopenia, Osteoporosis, Pneumonia (2019), Polypharmacy, Reflux esophagitis, Right sided sciatica (12/22/2017), Tobacco use disorder, Unspecified chronic bronchitis ([place]), Vertigo, Vitamin B12 deficiency, and Wears glasses.  Significant Hospital Events   7/8: Bipap initiated for Co2 of 78.4 and pH 7.197   Consults:  CCM   Procedures:    Significant Diagnostic Tests:   ABG    Component Value Date/Time   PHART 7.288 (L) 07/04/2020 1938   PCO2ART 61.6 (H) 07/04/2020 1938   PO2ART 98 07/04/2020 1938    HCO3 29.5 (H) 07/04/2020 1938   TCO2 31 07/04/2020 1938   O2SAT 96.0 07/04/2020 1938   Head CT: No acute intracranial findings. Chronic microvascular ischemic change and cerebral volume loss.  CXR: mild cardiomegaly and mild, diffuse interstitial pulmonary opacity, likely mild edema. No focal airspace opacity.  Micro Data:  Blood Cultures pending, 7/8  MRSA PCR negative   Antimicrobials:  7/8: Unasyn 3mg   Interim history/subjective:  Overnight, patient was able to wean off of Bipap. UOP 250. Started on half of home pain medication including MS contin and oxycodone   Objective   Blood pressure 99/64, pulse 78, temperature 98.3 F (36.8 C), temperature source Oral, resp. rate 11, weight 54.7 kg, SpO2 96 %.        Intake/Output Summary (Last 24 hours) at 07/05/2020 1152 Last data filed at 07/05/2020 0411 Gross per 24 hour  Intake 200 ml  Output 450 ml  Net -250 ml   Filed Weights   07/05/20 0430  Weight: 54.7 kg    Examination: General: elderly and frail appearing female sitting upright in bed  HENT: MMM, no rhinorrhea Lungs: decreased breath sounds diffusely and bilaterally  Cardiovascular: RRR  Abdomen: soft, NT, bowel sounds minimal  Extremities: no LE edema or wounds examined  Neuro: alert, oriented  GU: golden appearing urine   Resolved Hospital Problem list   Acute respiratory failure requiring Bipap   Assessment & Plan:   AMS Acute hypercarbic respiratory failure Acute on chronic heart failure  Last echocardiogram in Feb of 2021 with EF of 60-65%, grade II diastolic dysfunction, elevated left atrial pressures. Patient follows with Dr. Johnsie Cancel for cardiology. Overnight, patient able to wean off of BiPap and now on [place] 6 liters.  - Bipap qHS  -  will need repeat ABG either 7/11 or 7/12  - lasix for diuresis  - continue respiratory support as needed   Leukocytosis WBC improved from 26 to 20 today. No signs of urinary nor respiratory source of infection. Patient  febrile to 100.4, has been afebrile since presentation to ED. S/p 1 time dose of unaysn.  - monitor WBC with serial CBC  - monitor fever curve - will hold further antibiotic therapy - blood cultures pending  - plan to repeat CXR if respiratory status does not improve or worsens   AKI Cr increased to 1.9 on admission, today improvded to 1.6. Patient s/p 1x dose of 80 Lasix with 230 UOP.  - monitor Cr with serial BMP  - Lung exam sounds as if patient with increased volume, repeat diuresis, 80mg  BID  - strict I/Os   Depression  Bipolar Disorder  Home medications include Zoloft 100mg , wellbutrin and abilify 2mg .   - will restart home Zoloft wellbutrin and abilify     Chronic Pain Home regimen includes MS contin 60mg  BID and oxycodone 10mg  four times daily. Gabapentin 300mg  TID  - 30mg  MS contin three times daily  - continue oxycodone 10mg  QID PRN - restart home gabapentin 300mg  TID   Alcohol use disorder CIWAs 0/0/5 yesterday for agitation, EtOH <10 on admission.  Monitor CIWA   Best practice:  Diet: heart healthy diet  Pain/Anxiety/Delirium protocol (if indicated): oxycodone and MS contin  VAP protocol (if indicated): N/A DVT prophylaxis: Lovenox GI prophylaxis: Protonix Glucose control: n/a Mobility: Bed rest Code Status: Full Family Communication: patient's spouse and daughter updated at bedside 7/9 Disposition: ICU for another 24 hours then likely to step down unit    Labs   CBC: Recent Labs  Lab 07/04/20 1501 07/04/20 1518 07/04/20 1521 07/04/20 1728 07/04/20 1938 07/04/20 1953 07/05/20 0327  WBC 24.2*  --   --   --   --  26.5* 20.4*  NEUTROABS 20.3*  --   --   --   --   --   --   HGB 14.5   < > 16.0* 15.0 14.3 13.6 11.7*  HCT 46.8*   < > 47.0* 44.0 42.0 43.2 37.1  MCV 101.3*  --   --   --   --  98.6 97.9  PLT 267  --   --   --   --  218 195   < > = values in this interval not displayed.    Basic Metabolic Panel: Recent Labs  Lab 07/04/20 1501 07/04/20  1501 07/04/20 1518 07/04/20 1521 07/04/20 1728 07/04/20 1744 07/04/20 1938 07/04/20 1953 07/05/20 0327  NA 136   < > 137 136 139  --  139  --  138  K 4.9   < > 3.9 4.2 3.8  --  3.8  --  3.5  CL 98  --   --  96*  --   --   --   --  98  CO2 22  --   --   --   --   --   --   --  28  GLUCOSE 117*  --   --  115*  --   --   --   --  104*  BUN 13  --   --  17  --   --   --   --  19  CREATININE 1.99*  --   --  1.90*  --  1.88*  --   --  1.60*  CALCIUM 8.4*  --   --   --   --   --   --   --  7.6*  MG  --   --   --   --   --   --   --  1.8 1.8  PHOS  --   --   --   --   --   --   --  5.7* 5.6*   < > = values in this interval not displayed.   GFR: Estimated Creatinine Clearance: 27 mL/min (A) (by C-G formula based on SCr of 1.6 mg/dL (H)). Recent Labs  Lab 07/04/20 1501 07/04/20 1953 07/05/20 0327  WBC 24.2* 26.5* 20.4*    Liver Function Tests: Recent Labs  Lab 07/04/20 1501  AST 76*  ALT 24  ALKPHOS 63  BILITOT 1.4*  PROT 6.6  ALBUMIN 3.7   No results for input(s): LIPASE, AMYLASE in the last 168 hours. No results for input(s): AMMONIA in the last 168 hours.  ABG    Component Value Date/Time   PHART 7.288 (L) 07/04/2020 1938   PCO2ART 61.6 (H) 07/04/2020 1938   PO2ART 98 07/04/2020 1938   HCO3 29.5 (H) 07/04/2020 1938   TCO2 31 07/04/2020 1938   O2SAT 96.0 07/04/2020 1938     Coagulation Profile: Recent Labs  Lab 07/04/20 1501  INR 1.0    Cardiac Enzymes: No results for input(s): CKTOTAL, CKMB, CKMBINDEX, TROPONINI in the last 168 hours.  HbA1C: Hgb A1c MFr Bld  Date/Time Value Ref Range Status  10/01/2018 02:53 AM 5.2 4.8 - 5.6 % Final    Comment:    (NOTE)         Prediabetes: 5.7 - 6.4         Diabetes: >6.4         Glycemic control for adults with diabetes: <7.0   12/22/2017 04:50 PM 4.9 4.6 - 6.5 % Final    Comment:    Glycemic Control Guidelines for People with Diabetes:Non Diabetic:  <6%Goal of Therapy: <7%Additional Action Suggested:  >8%      CBG: Recent Labs  Lab 07/04/20 1936  GLUCAP 133*    Past Medical History  She,  has a past medical history of Acute cystitis, Acute respiratory failure ([place]), Allergy, Anemia, Anxiety, Arthritis, Back pain, Blood transfusion, CAP (community acquired pneumonia), CHF (congestive heart failure) ([place]), Chronic pain syndrome, Common bile duct stone, Depression, Diverticulosis of colon, Duodenal ulcer hemorrhagic-after biliary sphincterotomy, Dysthymia, Esophagitis, EtOH dependence ([place]), Gastritis, GERD (gastroesophageal reflux disease), H/O chest pain (Dec. 2013), colonic polyps, Hypertension, Irritable bowel syndrome, Lymphocytic colitis (02/16/2020), Osteopenia, Osteoporosis, Pneumonia (2019), Polypharmacy, Reflux esophagitis, Right sided sciatica (12/22/2017), Tobacco use disorder, Unspecified chronic bronchitis ([place]), Vertigo, Vitamin B12 deficiency, and Wears glasses.   Surgical History    Past Surgical History:  Procedure Laterality Date  . APPENDECTOMY    . BACK SURGERY  10-09   Dr. Patrice Paradise  . BIOPSY  02/14/2020   Procedure: BIOPSY;  Surgeon: Gatha Mayer, MD;  Location: [facility];  Service: Endoscopy;;  . CARPAL TUNNEL RELEASE Right 08/14/2014   Procedure: RIGHT CARPAL TUNNEL RELEASE AND INJECT LEFT THUMB;  Surgeon: Daryll Brod, MD;  Location: [facility];  Service: Orthopedics;  Laterality: Right;  . CHOLECYSTECTOMY N/A 02/22/2020   Procedure: LAPAROSCOPIC CHOLECYSTECTOMY WITH INTRAOPERATIVE CHOLANGIOGRAM;  Surgeon: Kieth Brightly Arta Bruce, MD;  Location: [facility];  Service: General;  Laterality: N/A;  . COLONOSCOPY    . COLONOSCOPY WITH PROPOFOL N/A  02/14/2020   Procedure: COLONOSCOPY WITH PROPOFOL;  Surgeon: Gatha Mayer, MD;  Location: [facility];  Service: Endoscopy;  Laterality: N/A;  . ENDOSCOPIC RETROGRADE CHOLANGIOPANCREATOGRAPHY (ERCP) WITH PROPOFOL N/A 12/25/2019   Procedure: ENDOSCOPIC RETROGRADE CHOLANGIOPANCREATOGRAPHY (ERCP) WITH PROPOFOL;  Surgeon:  Gatha Mayer, MD;  Location: [facility];  Service: Gastroenterology;  Laterality: N/A;  . ESOPHAGOGASTRODUODENOSCOPY (EGD) WITH PROPOFOL N/A 01/06/2020   Procedure: ESOPHAGOGASTRODUODENOSCOPY (EGD) WITH PROPOFOL;  Surgeon: Irene Shipper, MD;  Location: [facility];  Service: Endoscopy;  Laterality: N/A;  . HOT HEMOSTASIS N/A 01/06/2020   Procedure: HOT HEMOSTASIS (ARGON PLASMA COAGULATION/BICAP);  Surgeon: Irene Shipper, MD;  Location: [facility] ENDOSCOPY;  Service: Endoscopy;  Laterality: N/A;  . INTRAMEDULLARY (IM) NAIL INTERTROCHANTERIC Right 10/01/2018   Procedure: INTRAMEDULLARY (IM) NAIL INTERTROCHANTRIC;  Surgeon: Thornton Park, MD;  Location: [facility]S;  Service: Orthopedics;  Laterality: Right;  . LAPAROSCOPY N/A 02/21/2013   Procedure: LAPAROSCOPY OPERATIVE;  Surgeon: Margarette Asal, MD;  Location: [facility];  Service: Gynecology;  Laterality: N/A;  REQUESTING 5MM SCOPE WITH CAMERA  . LEFT HEART CATH AND CORONARY ANGIOGRAPHY N/A 05/11/2019   Procedure: LEFT HEART CATH AND CORONARY ANGIOGRAPHY;  Surgeon: Sherren Mocha, MD;  Location: [facility];  Service: Cardiovascular;  Laterality: N/A;  . REMOVAL OF STONES  12/25/2019   Procedure: Karlyn Agee;  Surgeon: Gatha Mayer, MD;  Location: [facility] ENDOSCOPY;  Service: Gastroenterology;;  . Clide Deutscher  01/06/2020   Procedure: Clide Deutscher;  Surgeon: Irene Shipper, MD;  Location: [facility] ENDOSCOPY;  Service: Endoscopy;;  . Joan Mayans  12/25/2019   Procedure: SPHINCTEROTOMY;  Surgeon: Gatha Mayer, MD;  Location: [facility];  Service: Gastroenterology;;  . TONSILLECTOMY    . TUBAL LIGATION    . UPPER GASTROINTESTINAL ENDOSCOPY       Social History   reports that she has been smoking cigarettes. She has a 30.00 pack-year smoking history. She has never used smokeless tobacco. She reports current alcohol use of about 3.0 standard drinks of alcohol per week. She reports that she does not use drugs.   Family History   Her family  history includes Alcohol abuse in her daughter; Bipolar disorder in her daughter; Colon cancer in her brother and father; Drug abuse in her daughter; Heart disease in her mother; Prostate cancer in her father; Skin cancer in her sister. There is no history of Esophageal cancer, Rectal cancer, or Stomach cancer.   Allergies Allergies  Allergen Reactions  . Atorvastatin Nausea And Vomiting  . Levofloxacin Other (See Comments)    Reaction: achilles tendon pain  . Omnicef [Cefdinir] Diarrhea    Uncontrollable diarrhea  . Trazodone And Nefazodone Other (See Comments)    "Felt like I was going to faint"  . Sulfa Antibiotics Rash  . Sulfonamide Derivatives Rash     Home Medications  Prior to Admission medications   Medication Sig Start Date End Date Taking? Authorizing Provider  ARIPiprazole (ABILIFY) 2 MG tablet Take 1 tablet (2 mg total) by mouth daily. 05/17/20 08/15/20 Yes Pucilowski, Olgierd A, MD  budesonide (ENTOCORT EC) 3 MG 24 hr capsule TAKE 3 CAPSULES ONCE DAILY. Patient taking differently: Take 9 mg by mouth daily.  05/15/20  Yes Gatha Mayer, MD  buPROPion [facility] SR) 150 MG 12 hr tablet Take 1 tablet (150 mg total) by mouth daily. 06/19/20 08/18/20 Yes Pucilowski, Olgierd A, MD  busPIRone (BUSPAR) 15 MG tablet Take 1 tablet (15 mg total) by mouth 2 (two) times daily. 07/15/20 10/13/20 Yes  Pucilowski, Olgierd A, MD  Cholecalciferol (VITAMIN D) 50 MCG (2000 UT) tablet Take 2,000 Units by mouth daily after breakfast.    Yes [provider]  denosumab (PROLIA) 60 MG/ML SOSY injection Inject 60 mg into the skin every 6 (six) months.   Yes [provider]  folic acid (FOLVITE) 1 MG tablet Take 1 tablet (1 mg total) by mouth daily. Patient taking differently: Take 1 mg by mouth daily after breakfast.  09/05/18  Yes Biagio Borg, MD  morphine (MS CONTIN) 60 MG 12 hr tablet Take 60 mg by mouth every 12 (twelve) hours. 05/02/19  Yes [provider]  nitroGLYCERIN  (NITROSTAT) 0.4 MG SL tablet Place 1 tablet (0.4 mg total) under the tongue every 5 (five) minutes as needed for chest pain. 04/22/20  Yes Josue Hector, MD  ondansetron (ZOFRAN-ODT) 4 MG disintegrating tablet Take 4 mg by mouth every 8 (eight) hours as needed for nausea or vomiting.   Yes [provider]  Oxycodone HCl 10 MG TABS Take 10 mg by mouth 4 (four) times daily.  03/06/19  Yes [provider]  pantoprazole (PROTONIX) 40 MG tablet TAKE (1) TABLET TWICE A DAY BEFORE MEALS. Patient taking differently: Take 40 mg by mouth 2 (two) times daily with a meal.  05/02/20  Yes Gatha Mayer, MD  potassium chloride (MICRO-K) 10 MEQ CR capsule Take 10 mEq by mouth 2 (two) times daily.   Yes [provider]  rosuvastatin (CRESTOR) 5 MG tablet Take 1 tablet (5 mg total) by mouth every Monday, Wednesday, and Friday at 6 PM. 08/30/19 02/07/21 Yes Josue Hector, MD  sertraline (ZOLOFT) 100 MG tablet Take 2 tablets (200 mg total) by mouth at bedtime. 05/17/20 08/15/20 Yes Pucilowski, Olgierd A, MD  spironolactone (ALDACTONE) 25 MG tablet Take 0.5 tablets (12.5 mg total) by mouth daily. 04/22/20  Yes Josue Hector, MD  vitamin B-12 (CYANOCOBALAMIN) 100 MCG tablet Take 1 tablet (100 mcg total) by mouth daily. Patient taking differently: Take 100 mcg by mouth daily after breakfast.  11/03/18  Yes Biagio Borg, MD  ALPRAZolam Duanne Moron) 0.25 MG tablet Take 1 tablet (0.25 mg total) by mouth 3 (three) times daily as needed for anxiety. Patient not taking: Reported on 07/04/2020 03/20/20   Gatha Mayer, MD  aspirin EC 81 MG tablet Take 1 tablet (81 mg total) by mouth daily. 04/22/20   Josue Hector, MD  gabapentin (NEURONTIN) 300 MG capsule Take 300 mg by mouth 3 (three) times daily.    [provider]  hyoscyamine (ANASPAZ) 0.125 MG TBDP disintergrating tablet Place 0.125 mg under the tongue every 6 (six) hours as needed ([name] cramps).     [provider]  Multiple Vitamin  (MULTIVITAMIN WITH MINERALS) TABS tablet Take 1 tablet by mouth daily. Patient not taking: Reported on 07/04/2020 01/05/20   Swayze, Ava, DO  nicotine (NICODERM CQ - DOSED IN MG/24 HOURS) 21 mg/24hr patch Place 1 patch (21 mg total) onto the skin daily. Patient not taking: Reported on 07/04/2020 01/05/20   Swayze, Ava, DO  valACYclovir (VALTREX) 500 MG tablet Take 500 mg by mouth 2 (two) times daily as needed (flare ups). Patient not taking: Reported on 07/04/2020    [provider]     Critical care time: [place], MD  [facility], PGY-2  07/05/20

## 2020-07-05 NOTE — Progress Notes (Signed)
[facility] Progress Note Patient Name: [name] DOB: [birth year] MRN: [id number]   Date of Service  07/05/2020  HPI/Events of Note  Patient is now more alert, and off BIPAP, she is complaining of pain.  eICU Interventions  Pt  started back on half her listed home doses of MS Contin and Oxycodone respectively.        Kerry Kass Laysa Kimmey 07/05/2020, 4:40 AM

## 2020-07-05 NOTE — Progress Notes (Signed)
Notified E-link that patient is somnolent and that PO Morphine, Gabapentin, and Zoloft were held. Patient will arouse to voice and answer orientation questions correctly but she quickly goes back to sleep. Patient had episode of emesis for which Zofran was given.

## 2020-07-05 NOTE — Progress Notes (Signed)
Patient weaned off BiPAP by RT.  Patient placed on 4L [place].  Vitals stable. RT will continue to monitor.  BiPAP at bedside if needed.

## 2020-07-05 NOTE — Progress Notes (Signed)
[facility] Progress Note Patient Name: Denise King DOB: 08/11/1951 MRN: [id number]   Date of Service  07/05/2020  HPI/Events of Note  Patient with excessive sedation on her home pain medications.  eICU Interventions  Medications held tonight, and doses reduced going forward, order to hold medications for sedation, will check ABG tonight.        Kerry Kass Evonne Rinks 07/05/2020, 10:59 PM

## 2020-07-06 ENCOUNTER — Inpatient Hospital Stay (HOSPITAL_COMMUNITY): Payer: Medicare HMO

## 2020-07-06 DIAGNOSIS — J9622 Acute and chronic respiratory failure with hypercapnia: Secondary | ICD-10-CM

## 2020-07-06 LAB — COMPREHENSIVE METABOLIC PANEL
ALT: 136 U/L — ABNORMAL HIGH (ref 0–44)
AST: 169 U/L — ABNORMAL HIGH (ref 15–41)
Albumin: 3.3 g/dL — ABNORMAL LOW (ref 3.5–5.0)
Alkaline Phosphatase: 56 U/L (ref 38–126)
Anion gap: 12 (ref 5–15)
BUN: 20 mg/dL (ref 8–23)
CO2: 39 mmol/L — ABNORMAL HIGH (ref 22–32)
Calcium: 8.1 mg/dL — ABNORMAL LOW (ref 8.9–10.3)
Chloride: 87 mmol/L — ABNORMAL LOW (ref 98–111)
Creatinine, Ser: 0.99 mg/dL (ref 0.44–1.00)
GFR calc Af Amer: >60 mL/min (ref >60)
GFR calc non Af Amer: 59 mL/min — ABNORMAL LOW (ref >60)
Glucose, Bld: 129 mg/dL — ABNORMAL HIGH (ref 70–99)
Potassium: 3.2 mmol/L — ABNORMAL LOW (ref 3.5–5.1)
Sodium: 138 mmol/L (ref 135–145)
Total Bilirubin: 0.7 mg/dL (ref 0.3–1.2)
Total Protein: 6.5 g/dL (ref 6.5–8.1)

## 2020-07-06 LAB — BASIC METABOLIC PANEL
Anion gap: 15 (ref 5–15)
BUN: 19 mg/dL (ref 8–23)
CO2: 38 mmol/L — ABNORMAL HIGH (ref 22–32)
Calcium: 8.5 mg/dL — ABNORMAL LOW (ref 8.9–10.3)
Chloride: 85 mmol/L — ABNORMAL LOW (ref 98–111)
Creatinine, Ser: 0.89 mg/dL (ref 0.44–1.00)
GFR calc Af Amer: >60 mL/min (ref >60)
GFR calc non Af Amer: >60 mL/min (ref >60)
Glucose, Bld: 160 mg/dL — ABNORMAL HIGH (ref 70–99)
Potassium: 2.9 mmol/L — ABNORMAL LOW (ref 3.5–5.1)
Sodium: 138 mmol/L (ref 135–145)

## 2020-07-06 LAB — CBC
HCT: 39.4 % (ref 36.0–46.0)
Hemoglobin: 12.4 g/dL (ref 12.0–15.0)
MCH: 30.5 pg (ref 26.0–34.0)
MCHC: 31.5 g/dL (ref 30.0–36.0)
MCV: 96.8 fL (ref 80.0–100.0)
Platelets: 223 10*3/uL (ref 150–400)
RBC: 4.07 MIL/uL (ref 3.87–5.11)
RDW: 11.9 % (ref 11.5–15.5)
WBC: 15.2 10*3/uL — ABNORMAL HIGH (ref 4.0–10.5)
nRBC: 0 % (ref 0.0–0.2)

## 2020-07-06 LAB — POCT I-STAT 7, (LYTES, BLD GAS, ICA,H+H)
Acid-Base Excess: 11 mmol/L — ABNORMAL HIGH (ref 0.0–2.0)
Bicarbonate: 41 mmol/L — ABNORMAL HIGH (ref 20.0–28.0)
Calcium, Ion: 1.06 mmol/L — ABNORMAL LOW (ref 1.15–1.40)
HCT: 39 % (ref 36.0–46.0)
Hemoglobin: 13.3 g/dL (ref 12.0–15.0)
O2 Saturation: 89 %
Patient temperature: 98.6
Potassium: 3.5 mmol/L (ref 3.5–5.1)
Sodium: 139 mmol/L (ref 135–145)
TCO2: 43 mmol/L — ABNORMAL HIGH (ref 22–32)
pCO2 arterial: 79.5 mmHg (ref 32.0–48.0)
pH, Arterial: 7.32 — ABNORMAL LOW (ref 7.350–7.450)
pO2, Arterial: 64 mmHg — ABNORMAL LOW (ref 83.0–108.0)

## 2020-07-06 LAB — BRAIN NATRIURETIC PEPTIDE: B Natriuretic Peptide: 426.5 pg/mL — ABNORMAL HIGH (ref 0.0–100.0)

## 2020-07-06 MED ORDER — POTASSIUM CHLORIDE CRYS ER 20 MEQ PO TBCR
40.0000 meq | EXTENDED_RELEASE_TABLET | Freq: Two times a day (BID) | ORAL | Status: AC
  Filled 2020-07-06 (×3): qty 2

## 2020-07-06 MED ORDER — MAGNESIUM SULFATE 2 GM/50ML IV SOLN
2.0000 g | Freq: Once | INTRAVENOUS | Status: AC
  Administered 2020-07-06: 2 g via INTRAVENOUS
  Filled 2020-07-06: qty 50

## 2020-07-06 MED ORDER — ALPRAZOLAM 0.25 MG PO TABS
0.2500 mg | ORAL_TABLET | Freq: Three times a day (TID) | ORAL | Status: DC | PRN
  Administered 2020-07-07 – 2020-07-08 (×5): 0.25 mg via ORAL
  Filled 2020-07-06 (×5): qty 1

## 2020-07-06 MED ORDER — OXYCODONE HCL 5 MG PO TABS
5.0000 mg | ORAL_TABLET | Freq: Four times a day (QID) | ORAL | Status: DC | PRN
  Administered 2020-07-06 – 2020-07-08 (×5): 5 mg via ORAL
  Filled 2020-07-06 (×5): qty 1

## 2020-07-06 MED ORDER — LIDOCAINE 5 % EX PTCH
1.0000 | MEDICATED_PATCH | CUTANEOUS | Status: DC
  Administered 2020-07-06 – 2020-07-10 (×5): 1 via TRANSDERMAL
  Filled 2020-07-06 (×5): qty 1

## 2020-07-06 MED ORDER — METHYLPREDNISOLONE SODIUM SUCC 40 MG IJ SOLR
40.0000 mg | Freq: Two times a day (BID) | INTRAMUSCULAR | Status: DC
  Administered 2020-07-06 – 2020-07-07 (×2): 40 mg via INTRAVENOUS
  Filled 2020-07-06 (×2): qty 1

## 2020-07-06 MED ORDER — ACETAMINOPHEN 325 MG PO TABS
650.0000 mg | ORAL_TABLET | Freq: Four times a day (QID) | ORAL | Status: DC | PRN
  Administered 2020-07-06 – 2020-07-09 (×7): 650 mg via ORAL
  Filled 2020-07-06 (×8): qty 2

## 2020-07-06 MED ORDER — IPRATROPIUM-ALBUTEROL 0.5-2.5 (3) MG/3ML IN SOLN
3.0000 mL | Freq: Four times a day (QID) | RESPIRATORY_TRACT | Status: DC
  Administered 2020-07-06 – 2020-07-07 (×7): 3 mL via RESPIRATORY_TRACT
  Filled 2020-07-06 (×7): qty 3

## 2020-07-06 NOTE — Progress Notes (Signed)
[facility] Progress Note Patient Name: LIDIA CLAVIJO DOB: 03-May-1951 MRN: [id number]   Date of Service  07/06/2020  HPI/Events of Note  Patient complaining of headache and back pain not relieved by Tylenol. She usually takes MS Contin 60 mg q 12 and Oxycodone 10 mg QID but has been held due to somnolence which was thought to be the cause of her hypercapneic respiratory failure. She is now awake.  eICU Interventions  Resumed Oxycodone 5 mg for now     Intervention Category Intermediate Interventions: Pain - evaluation and management  Judd Lien 07/06/2020, 7:56 PM

## 2020-07-06 NOTE — Progress Notes (Signed)
[facility] Progress Note Patient Name: Denise King DOB: [birth year] MRN: [id number]   Date of Service  07/06/2020  HPI/Events of Note  Patient has a history of chronic hypercapnic respiratory failure, she is currently off BIPAP and on 11 liters high flow with a saturation in the low 90's.  eICU Interventions  Nursing communication order entered to aggressively wean FIO2 targeting  a saturation of > or = to 88%.        Kerry Kass [place] Dani 07/06/2020, 5:52 AM

## 2020-07-06 NOTE — Plan of Care (Signed)

## 2020-07-06 NOTE — Progress Notes (Signed)
Attending addendum 07/06/2020 Seen and examined with NP, agree with assessment and plan as written. Seen in f/u for respiratory failure. Somnolent this AM, c/o chronic back and neck pain. On exam, poor inspiratory effort, no wheezing. Minimal edema. ABG c/w acute on chronic retention K being repleted Net neg 3.6L  A: Acute on chronic hypercarbic respiratory failure suspicious mostly for narcotic over-use in patient with undiagnosed COPD + element of volume overload.  P: Continue to hold abx, trend fever/wbc curve Push diuresis, consider diamox if HCO3 continues to rise BIPAP at night and PRN Bronchodilators and short course of steroids  33 min cc time Erskine Emery MD PCCM       NAME:  Denise King, MRN:  [id number], DOB:  04-10-1951, LOS: 2 ADMISSION DATE:  07/04/2020, CONSULTATION DATE:  07/05/20 REFERRING MD: Calvert Cantor CHIEF COMPLAINT:  AMS and Emesis    Brief History   Denise King is a 69 year old female with pmhx of HFpEF, chronic pain, bipolar disorder, tobacco use, alcohol abuse who presented with AMS and emesis found to have acute on chronic HF exacerbation with hypercarbia requiring Bipap for respiratory support .   Past Medical History   has a past medical history of Acute cystitis, Acute respiratory failure ([place]), Allergy, Anemia, Anxiety, Arthritis, Back pain, Blood transfusion, CAP (community acquired pneumonia), CHF (congestive heart failure) ([place]), Chronic pain syndrome, Common bile duct stone, Depression, Diverticulosis of colon, Duodenal ulcer hemorrhagic-after biliary sphincterotomy, Dysthymia, Esophagitis, EtOH dependence ([place]), Gastritis, GERD (gastroesophageal reflux disease), H/O chest pain (Dec. 2013), colonic polyps, Hypertension, Irritable bowel syndrome, Lymphocytic colitis (02/16/2020), Osteopenia, Osteoporosis, Pneumonia (2019), Polypharmacy, Reflux esophagitis, Right sided sciatica (12/22/2017), Tobacco use disorder, Unspecified chronic  bronchitis ([place]), Vertigo, Vitamin B12 deficiency, and Wears glasses.  Significant Hospital Events   7/8: Bipap initiated for Co2 of 78.4 and pH 7.197  7/9: Weaned off BiPAP.  More hemodynamically stable.  Still hypoxic.  Still hypercarbic.  Plan was to continue nocturnal BiPAP, also decreasing narcotic support 7/10: Hypercarbic with PCO2 almost 80 placed back on BiPAP, later titrated to supplemental oxygen via high flow device titrating FiO2, still lethargic but when she is awake asks for pain. Narcotics stopped.   Consults:  CCM   Procedures:    Significant Diagnostic Tests:  Head CT: No acute intracranial findings. Chronic microvascular ischemic change and cerebral volume loss.  CXR: mild cardiomegaly and mild, diffuse interstitial pulmonary opacity, likely mild edema. No focal airspace opacity.  Micro Data:  Blood Cultures pending, 7/8 >>> MRSA PCR negative   Antimicrobials:  7/8: Unasyn 3mg   Interim history/subjective:    Objective   Blood pressure 99/70, pulse 72, temperature 99.1 F (37.3 C), temperature source Oral, resp. rate (Abnormal) 21, weight 51.6 kg, SpO2 93 %.        Intake/Output Summary (Last 24 hours) at 07/06/2020 0934 Last data filed at 07/06/2020 0600 Gross per 24 hour  Intake 200 ml  Output 3826 ml  Net -3626 ml   Filed Weights   07/05/20 0430 07/06/20 0500  Weight: 54.7 kg 51.6 kg    Examination:  General this is a 69 year old white female she is sitting up in bed and currently in no acute distress but does endorse discomfort HEENT normocephalic atraumatic no jugular venous distention is appreciated mucous membranes are moist Pulmonary: Coarse breath sounds bilaterally some occasional wheezes basilar crackles no accessory use currently 15 L high flow oxygen Cardiac: Regular rate and rhythm without murmur rub or gallop Abdomen: Soft not tender  no organomegaly   extremities: Scattered areas of ecchymosis no significant edema Neuro: Awake  oriented sleepy.  Moves all extremities.  Resolved Hospital Problem list   Acute respiratory failure requiring Bipap   Assessment & Plan:   Acute hypercarbic respiratory failure in setting of acute on chronic heart failure, complicated further by narcotics, also question underlying obstructive lung disease she does have a history of tobacco abuse Last echocardiogram in Feb of 2021 with EF of 60-65%, grade II diastolic dysfunction, elevated left atrial pressures. Patient follows with Dr. Johnsie Cancel for cardiology.  -Portable chest x-ray personally reviewed from admission showed diffuse pulmonary infiltrates consistent with what looked to be pulmonary edema -Clinically Improving however remains hypercarbic on last ABG Plan Portable chest x-ray today to follow-up pulmonary edema Trend BNP Supplemental oxygen titrate for pulse ox > 88; BiPAP mandatory at at bedtime and as needed during day she will need to keep in the intensive care given the acuteness Add scheduled bronchodilators Continuing aggressive diuresis w/ lasix and spironolactone;  as long as BUN/creatinine and blood pressure will allow Minimize narcotics, these need to be minimized at home as well as she has noncancer-related chronic pain Needs smoking cessation Would benefit from PFTs as outpatient Add Lidoderm patch for chest discomfort associated with previous episode of CPR  Acute metabolic encephalopathy, superimposed on history of depression/bipolar disease and further complicated by chronic pain and history of alcohol use disorder: Multifactorial and secondary to hypercarbia, but also possibly medication induced  -Currently Plan Continue Wellbutrin @ 150 mg, neurontin 100 tid, abilify 2mg  daily, and zoloft  Will hold narcotics at this time; may need to add low dose clonidine  Cont neuro checks   AKI Peak creatinine 1.9 at admission, this continues to improve with IV diuresis and Blood pressure control Plan Continue IV  diuresis KVO fluids Daily chemistries  Fluid electrolyte balance: Hypokalemia Plan Replace potassium Checking magnesium given active diuresis Ensure daily labs monitored   Leukocytosis had low-grade fever on admission over 100.4 maximum 10 since then 99.4 -White blood cell count continues to improve Plan Continue to trend CBCs and fever curve Holding antibiotics at this time   Best practice:  Diet: heart healthy diet  Pain/Anxiety/Delirium protocol (if indicated): oxycodone and MS contin  VAP protocol (if indicated): N/A DVT prophylaxis: Lovenox GI prophylaxis: Protonix Glucose control: n/a Mobility: Bed rest Code Status: Full Family Communication: patient's spouse and daughter updated at bedside 7/9 Disposition: ICU for another 24 hours then likely to step down unit    Labs   CBC: Recent Labs  Lab 07/04/20 1501 07/04/20 1518 07/04/20 1938 07/04/20 1953 07/05/20 0327 07/06/20 0024 07/06/20 0301  WBC 24.2*  --   --  26.5* 20.4*  --  15.2*  NEUTROABS 20.3*  --   --   --   --   --   --   HGB 14.5   < > 14.3 13.6 11.7* 13.3 12.4  HCT 46.8*   < > 42.0 43.2 37.1 39.0 39.4  MCV 101.3*  --   --  98.6 97.9  --  96.8  PLT 267  --   --  218 195  --  223   < > = values in this interval not displayed.    Basic Metabolic Panel: Recent Labs  Lab 07/04/20 1501 07/04/20 1518 07/04/20 1521 07/04/20 1728 07/04/20 1744 07/04/20 1938 07/04/20 1953 07/05/20 0327 07/06/20 0024  NA 136   < > 136 139  --  139  --  138  139  K 4.9   < > 4.2 3.8  --  3.8  --  3.5 3.5  CL 98  --  96*  --   --   --   --  98  --   CO2 22  --   --   --   --   --   --  28  --   GLUCOSE 117*  --  115*  --   --   --   --  104*  --   BUN 13  --  17  --   --   --   --  19  --   CREATININE 1.99*  --  1.90*  --  1.88*  --   --  1.60*  --   CALCIUM 8.4*  --   --   --   --   --   --  7.6*  --   MG  --   --   --   --   --   --  1.8 1.8  --   PHOS  --   --   --   --   --   --  5.7* 5.6*  --    < > =  values in this interval not displayed.   GFR: Estimated Creatinine Clearance: 27 mL/min (A) (by C-G formula based on SCr of 1.6 mg/dL (H)). Recent Labs  Lab 07/04/20 1501 07/04/20 1953 07/05/20 0327 07/06/20 0301  WBC 24.2* 26.5* 20.4* 15.2*    Liver Function Tests: Recent Labs  Lab 07/04/20 1501  AST 76*  ALT 24  ALKPHOS 63  BILITOT 1.4*  PROT 6.6  ALBUMIN 3.7   No results for input(s): LIPASE, AMYLASE in the last 168 hours. No results for input(s): AMMONIA in the last 168 hours.  ABG    Component Value Date/Time   PHART 7.320 (L) 07/06/2020 0024   PCO2ART 79.5 (HH) 07/06/2020 0024   PO2ART 64 (L) 07/06/2020 0024   HCO3 41.0 (H) 07/06/2020 0024   TCO2 43 (H) 07/06/2020 0024   O2SAT 89.0 07/06/2020 0024     Coagulation Profile: Recent Labs  Lab 07/04/20 1501  INR 1.0    Cardiac Enzymes: No results for input(s): CKTOTAL, CKMB, CKMBINDEX, TROPONINI in the last 168 hours.  HbA1C: Hgb A1c MFr Bld  Date/Time Value Ref Range Status  10/01/2018 02:53 AM 5.2 4.8 - 5.6 % Final    Comment:    (NOTE)         Prediabetes: 5.7 - 6.4         Diabetes: >6.4         Glycemic control for adults with diabetes: <7.0   12/22/2017 04:50 PM 4.9 4.6 - 6.5 % Final    Comment:    Glycemic Control Guidelines for People with Diabetes:Non Diabetic:  <6%Goal of Therapy: <7%Additional Action Suggested:  >8%     CBG: Recent Labs  Lab 07/04/20 1936 07/05/20 1945  GLUCAP 133* 130*     Critical care time: 35 minutes    Erick Colace ACNP-BC [place] Pager # [phone] OR # [phone] if no answer

## 2020-07-06 NOTE — Progress Notes (Signed)
Notified E-link up titrating High-flow nasal cannula to 15 LPM.

## 2020-07-06 NOTE — Progress Notes (Signed)
Notified Dr. Lucile Shutters of ABG result CO2 79.5. Placing patient on bipap and monitoring for nausea.

## 2020-07-07 DIAGNOSIS — I5023 Acute on chronic systolic (congestive) heart failure: Secondary | ICD-10-CM

## 2020-07-07 LAB — CBC
HCT: 40.5 % (ref 36.0–46.0)
Hemoglobin: 13.3 g/dL (ref 12.0–15.0)
MCH: 30.6 pg (ref 26.0–34.0)
MCHC: 32.8 g/dL (ref 30.0–36.0)
MCV: 93.3 fL (ref 80.0–100.0)
Platelets: 270 10*3/uL (ref 150–400)
RBC: 4.34 MIL/uL (ref 3.87–5.11)
RDW: 11.7 % (ref 11.5–15.5)
WBC: 14.8 10*3/uL — ABNORMAL HIGH (ref 4.0–10.5)
nRBC: 0 % (ref 0.0–0.2)

## 2020-07-07 LAB — POCT I-STAT 7, (LYTES, BLD GAS, ICA,H+H)
Acid-Base Excess: 18 mmol/L — ABNORMAL HIGH (ref 0.0–2.0)
Bicarbonate: 44 mmol/L — ABNORMAL HIGH (ref 20.0–28.0)
Calcium, Ion: 1.02 mmol/L — ABNORMAL LOW (ref 1.15–1.40)
HCT: 41 % (ref 36.0–46.0)
Hemoglobin: 13.9 g/dL (ref 12.0–15.0)
O2 Saturation: 93 %
Patient temperature: 98.7
Potassium: 3 mmol/L — ABNORMAL LOW (ref 3.5–5.1)
Sodium: 136 mmol/L (ref 135–145)
TCO2: 46 mmol/L — ABNORMAL HIGH (ref 22–32)
pCO2 arterial: 55.7 mmHg — ABNORMAL HIGH (ref 32.0–48.0)
pH, Arterial: 7.506 — ABNORMAL HIGH (ref 7.350–7.450)
pO2, Arterial: 62 mmHg — ABNORMAL LOW (ref 83.0–108.0)

## 2020-07-07 LAB — COMPREHENSIVE METABOLIC PANEL
ALT: 164 U/L — ABNORMAL HIGH (ref 0–44)
AST: 196 U/L — ABNORMAL HIGH (ref 15–41)
Albumin: 3.4 g/dL — ABNORMAL LOW (ref 3.5–5.0)
Alkaline Phosphatase: 58 U/L (ref 38–126)
Anion gap: 15 (ref 5–15)
BUN: 22 mg/dL (ref 8–23)
CO2: 37 mmol/L — ABNORMAL HIGH (ref 22–32)
Calcium: 8.5 mg/dL — ABNORMAL LOW (ref 8.9–10.3)
Chloride: 86 mmol/L — ABNORMAL LOW (ref 98–111)
Creatinine, Ser: 0.93 mg/dL (ref 0.44–1.00)
GFR calc Af Amer: >60 mL/min (ref >60)
GFR calc non Af Amer: >60 mL/min (ref >60)
Glucose, Bld: 138 mg/dL — ABNORMAL HIGH (ref 70–99)
Potassium: 2.9 mmol/L — ABNORMAL LOW (ref 3.5–5.1)
Sodium: 138 mmol/L (ref 135–145)
Total Bilirubin: 0.9 mg/dL (ref 0.3–1.2)
Total Protein: 7 g/dL (ref 6.5–8.1)

## 2020-07-07 LAB — PHOSPHORUS: Phosphorus: 1.8 mg/dL — ABNORMAL LOW (ref 2.5–4.6)

## 2020-07-07 LAB — MAGNESIUM: Magnesium: 2 mg/dL (ref 1.7–2.4)

## 2020-07-07 LAB — BRAIN NATRIURETIC PEPTIDE: B Natriuretic Peptide: 2174.9 pg/mL — ABNORMAL HIGH (ref 0.0–100.0)

## 2020-07-07 MED ORDER — ACETAZOLAMIDE 250 MG PO TABS
250.0000 mg | ORAL_TABLET | Freq: Once | ORAL | Status: DC
  Filled 2020-07-07: qty 1

## 2020-07-07 MED ORDER — POTASSIUM CHLORIDE CRYS ER 20 MEQ PO TBCR
40.0000 meq | EXTENDED_RELEASE_TABLET | Freq: Once | ORAL | Status: AC
  Administered 2020-07-07: 40 meq via ORAL

## 2020-07-07 MED ORDER — POTASSIUM CHLORIDE CRYS ER 20 MEQ PO TBCR
40.0000 meq | EXTENDED_RELEASE_TABLET | Freq: Two times a day (BID) | ORAL | Status: AC
  Administered 2020-07-07 (×2): 40 meq via ORAL
  Filled 2020-07-07 (×2): qty 2

## 2020-07-07 MED ORDER — POTASSIUM PHOSPHATES 15 MMOLE/5ML IV SOLN
30.0000 mmol | Freq: Once | INTRAVENOUS | Status: AC
  Administered 2020-07-07: 30 mmol via INTRAVENOUS
  Filled 2020-07-07: qty 10

## 2020-07-07 MED ORDER — ACETAZOLAMIDE 250 MG PO TABS
500.0000 mg | ORAL_TABLET | Freq: Two times a day (BID) | ORAL | Status: AC
  Administered 2020-07-07 (×2): 500 mg via ORAL
  Filled 2020-07-07 (×3): qty 2

## 2020-07-07 MED ORDER — IPRATROPIUM-ALBUTEROL 0.5-2.5 (3) MG/3ML IN SOLN
3.0000 mL | Freq: Two times a day (BID) | RESPIRATORY_TRACT | Status: DC
  Administered 2020-07-08 – 2020-07-10 (×5): 3 mL via RESPIRATORY_TRACT
  Filled 2020-07-07 (×5): qty 3

## 2020-07-07 MED ORDER — BUTALBITAL-APAP-CAFFEINE 50-325-40 MG PO TABS
1.0000 | ORAL_TABLET | ORAL | Status: DC | PRN
  Administered 2020-07-08 – 2020-07-10 (×4): 1 via ORAL
  Filled 2020-07-07 (×4): qty 1

## 2020-07-07 MED ORDER — PREDNISONE 20 MG PO TABS
40.0000 mg | ORAL_TABLET | Freq: Every day | ORAL | Status: DC
  Administered 2020-07-07 – 2020-07-10 (×4): 40 mg via ORAL
  Filled 2020-07-07 (×4): qty 2

## 2020-07-07 MED ORDER — CLONIDINE HCL 0.1 MG/24HR TD PTWK
0.1000 mg | MEDICATED_PATCH | TRANSDERMAL | Status: DC
  Administered 2020-07-07: 0.1 mg via TRANSDERMAL
  Filled 2020-07-07: qty 1

## 2020-07-07 MED ORDER — METOCLOPRAMIDE HCL 5 MG/ML IJ SOLN
10.0000 mg | Freq: Four times a day (QID) | INTRAMUSCULAR | Status: AC
  Administered 2020-07-07 – 2020-07-09 (×8): 10 mg via INTRAVENOUS
  Filled 2020-07-07 (×8): qty 2

## 2020-07-07 MED ORDER — CLONIDINE HCL 0.1 MG PO TABS
0.1000 mg | ORAL_TABLET | Freq: Two times a day (BID) | ORAL | Status: DC
  Administered 2020-07-07: 0.1 mg via ORAL
  Filled 2020-07-07: qty 1

## 2020-07-07 MED ORDER — POTASSIUM CHLORIDE CRYS ER 20 MEQ PO TBCR
40.0000 meq | EXTENDED_RELEASE_TABLET | Freq: Once | ORAL | Status: AC
  Administered 2020-07-07: 40 meq via ORAL
  Filled 2020-07-07: qty 2

## 2020-07-07 NOTE — Progress Notes (Addendum)
Attending addendum 07/07/2020 Seen and examined with NP, agree with assessment and plan as written. Seen in f/u for respiratory failure. Up in a chair. Still c/o head and back pain. On exam, poor inspiratory effort, no wheezing. Minimal edema. ABG alkalotic this am K being repleted I/O not accurate  A: Acute on chronic hypercarbic respiratory failure suspicious mostly for narcotic over-use in patient with undiagnosed COPD + element of volume overload.  P: Continue to hold abx, trend fever/wbc curve Diamox + lasix today BIPAP at night and PRN Can try some fioricet for headaches, would avoid titrating back up on opiates if able Bronchodilators and short course of steroids Okay for transfer to progressive, appreciate TRH taking over care  Erskine Emery MD PCCM       NAME:  Denise King, MRN:  [id number], DOB:  11/10/1951, LOS: 3 ADMISSION DATE:  07/04/2020, CONSULTATION DATE:  07/05/20 REFERRING MD: Calvert Cantor CHIEF COMPLAINT:  AMS and Emesis    Brief History   Denise King is a 69 year old female with pmhx of HFpEF, chronic pain, bipolar disorder, tobacco use, alcohol abuse who presented with AMS and emesis found to have acute on chronic HF exacerbation with hypercarbia requiring Bipap for respiratory support .   Past Medical History   has a past medical history of Acute cystitis, Acute respiratory failure ([place]), Allergy, Anemia, Anxiety, Arthritis, Back pain, Blood transfusion, CAP (community acquired pneumonia), CHF (congestive heart failure) ([place]), Chronic pain syndrome, Common bile duct stone, Depression, Diverticulosis of colon, Duodenal ulcer hemorrhagic-after biliary sphincterotomy, Dysthymia, Esophagitis, EtOH dependence ([place]), Gastritis, GERD (gastroesophageal reflux disease), H/O chest pain (Dec. 2013), colonic polyps, Hypertension, Irritable bowel syndrome, Lymphocytic colitis (02/16/2020), Osteopenia, Osteoporosis, Pneumonia (2019), Polypharmacy, Reflux  esophagitis, Right sided sciatica (12/22/2017), Tobacco use disorder, Unspecified chronic bronchitis ([place]), Vertigo, Vitamin B12 deficiency, and Wears glasses.  Significant Hospital Events   7/8: Bipap initiated for Co2 of 78.4 and pH 7.197  7/9: Weaned off BiPAP.  More hemodynamically stable.  Still hypoxic.  Still hypercarbic.  Plan was to continue nocturnal BiPAP, also decreasing narcotic support 7/10: Hypercarbic with PCO2 almost 80 placed back on BiPAP, later titrated to supplemental oxygen via high flow device titrating FiO2, still lethargic but when she is awake asks for pain. Narcotics stopped.  Then resumed at the family's insistence and after patient repeatedly requested 7/11: Awake not in distress.  Still on high flow oxygen but room to titrate down.  Her baseline CO2 calculates around 55.  A.m. arterial blood gas demonstrates metabolic alkalosis secondary to ongoing diuretics superimposed on her chronic hypercarbic state BNP increased chest x-ray showing bibasilar atelectasis and small effusions.  Ready for transfer to progressive  Consults:  CCM   Procedures:    Significant Diagnostic Tests:  Head CT: No acute intracranial findings. Chronic microvascular ischemic change and cerebral volume loss.  CXR: mild cardiomegaly and mild, diffuse interstitial pulmonary opacity, likely mild edema. No focal airspace opacity.  Micro Data:  Blood Cultures pending, 7/8 >>> MRSA PCR negative   Antimicrobials:  7/8: Unasyn 3mg   Interim history/subjective:   Complaining of headache and multiple areas of discomfort started on low-dose narcotic last night, tells me she is afraid she is going to withdrawal from her narcotics  Objective   Blood pressure 126/67, pulse 81, temperature 98.7 F (37.1 C), temperature source Oral, resp. rate 14, weight 48.2 kg, SpO2 93 %.        Intake/Output Summary (Last 24 hours) at 07/07/2020 0759 Last data filed  at 07/06/2020 1353 Gross per 24 hour  Intake  300 ml  Output 600 ml  Net -300 ml   Filed Weights   07/05/20 0430 07/06/20 0500 07/07/20 0424  Weight: 54.7 kg 51.6 kg 48.2 kg    Examination:  General this is a 69 year old chronically ill white female she is resting in bed not in acute distress HEENT normocephalic atraumatic no jugular venous distention Pulmonary diminished bases some scattered rhonchi no accessory use.  Still on 15 L high flow Cardiac regular rate and rhythm without murmur rub or gallop Abdomen soft nontender no organomegaly Extremities are warm and dry with brisk capillary refill, scattered areas of ecchymosis Neuro awake oriented no focal deficits  Resolved Hospital Problem list   Acute respiratory failure requiring Bipap  Acute metabolic encephalopathy resolved as of 7/11 Acute kidney injury resolved Assessment & Plan:   Acute hypercarbic respiratory failure in setting of acute on chronic heart failure, complicated further by narcotics, also question underlying obstructive lung disease she does have a history of tobacco abuse Last echocardiogram in Feb of 2021 with EF of 60-65%, grade II diastolic dysfunction, elevated left atrial pressures. Patient follows with Dr. Johnsie Cancel for cardiology.  Portable chest x-ray personally reviewed.  Aeration somewhat improved.  She has bibasilar effusions/atelectasis Still requiring high flow oxygen, however there is room to titrate here Currently looks to be at baseline CO2 Plan Continuing IV diuresis, we will supplement Lasix with Diamox today given rising serum bicarbonate Trend BNP Continue to wean supplemental oxygen, aiming for pulse oximetry greater than 88% Need to get her out of bed Start spirometry and flutter valve Continue bronchodilators Continue systemic steroids, changed to 40 daily of prednisone Smoking cessation We need to continue to work towards minimizing narcotics, I will not increase her current dosing Lidoderm patch, this is for CPR related  discomfort  history of depression/bipolar disease and further complicated by chronic pain and history of alcohol use disorder: She continues to complain of pain.  She is very concerned about withdrawal.  Complains of a headache.  We did add back narcotics last night at lower dose than baseline  plan Continue Wellbutrin, Neurontin, Abilify and Zoloft  No further escalation of narcotics  Add low-dose clonidine to address concern for withdrawal    Fluid electrolyte balance: Hypokalemia, hypophosphatemia Plan Replace and recheck   Leukocytosis continues to improve  plan Trend fever curve   Best practice:  Diet: heart healthy diet  Pain/Anxiety/Delirium protocol (if indicated): oxycodone and MS contin  VAP protocol (if indicated): N/A DVT prophylaxis: Lovenox GI prophylaxis: Protonix Glucose control: n/a Mobility: Bed rest Code Status: Full Family Communication: patient's spouse and daughter updated at bedside 7/9 Disposition:  We will move her to progressive care  Labs   CBC: Recent Labs  Lab 07/04/20 1501 07/04/20 1518 07/04/20 1953 07/04/20 1953 07/05/20 0327 07/06/20 0024 07/06/20 0301 07/07/20 0334 07/07/20 0434  WBC 24.2*  --  26.5*  --  20.4*  --  15.2* 14.8*  --   NEUTROABS 20.3*  --   --   --   --   --   --   --   --   HGB 14.5   < > 13.6   < > 11.7* 13.3 12.4 13.3 13.9  HCT 46.8*   < > 43.2   < > 37.1 39.0 39.4 40.5 41.0  MCV 101.3*  --  98.6  --  97.9  --  96.8 93.3  --   PLT 267  --  218  --  195  --  223 270  --    < > = values in this interval not displayed.    Basic Metabolic Panel: Recent Labs  Lab 07/04/20 1501 07/04/20 1518 07/04/20 1521 07/04/20 1728 07/04/20 1744 07/04/20 1938 07/04/20 1953 07/05/20 0327 07/05/20 0327 07/06/20 0024 07/06/20 1124 07/06/20 2127 07/07/20 0334 07/07/20 0434  NA 136   < > 136   < >  --    < >  --  138   < > 139 138 138 138 136  K 4.9   < > 4.2   < >  --    < >  --  3.5   < > 3.5 3.2* 2.9* 2.9* 3.0*  CL  98   < > 96*  --   --   --   --  98  --   --  87* 85* 86*  --   CO2 22  --   --   --   --   --   --  28  --   --  39* 38* 37*  --   GLUCOSE 117*   < > 115*  --   --   --   --  104*  --   --  129* 160* 138*  --   BUN 13   < > 17  --   --   --   --  19  --   --  20 19 22   --   CREATININE 1.99*   < > 1.90*   < > 1.88*  --   --  1.60*  --   --  0.99 0.89 0.93  --   CALCIUM 8.4*  --   --   --   --   --   --  7.6*  --   --  8.1* 8.5* 8.5*  --   MG  --   --   --   --   --   --  1.8 1.8  --   --   --   --  2.0  --   PHOS  --   --   --   --   --   --  5.7* 5.6*  --   --   --   --  1.8*  --    < > = values in this interval not displayed.   GFR: Estimated Creatinine Clearance: 44.7 mL/min (by C-G formula based on SCr of 0.93 mg/dL). Recent Labs  Lab 07/04/20 1953 07/05/20 0327 07/06/20 0301 07/07/20 0334  WBC 26.5* 20.4* 15.2* 14.8*    Liver Function Tests: Recent Labs  Lab 07/04/20 1501 07/06/20 1124 07/07/20 0334  AST 76* 169* 196*  ALT 24 136* 164*  ALKPHOS 63 56 58  BILITOT 1.4* 0.7 0.9  PROT 6.6 6.5 7.0  ALBUMIN 3.7 3.3* 3.4*   No results for input(s): LIPASE, AMYLASE in the last 168 hours. No results for input(s): AMMONIA in the last 168 hours.  ABG    Component Value Date/Time   PHART 7.506 (H) 07/07/2020 0434   PCO2ART 55.7 (H) 07/07/2020 0434   PO2ART 62 (L) 07/07/2020 0434   HCO3 44.0 (H) 07/07/2020 0434   TCO2 46 (H) 07/07/2020 0434   O2SAT 93.0 07/07/2020 0434     Coagulation Profile: Recent Labs  Lab 07/04/20 1501  INR 1.0    Cardiac Enzymes: No results for input(s): CKTOTAL, CKMB, CKMBINDEX, TROPONINI in the last 168 hours.  HbA1C: Hgb A1c MFr Bld  Date/Time Value Ref Range Status  10/01/2018 02:53 AM 5.2 4.8 - 5.6 % Final    Comment:    (NOTE)         Prediabetes: 5.7 - 6.4         Diabetes: >6.4         Glycemic control for adults with diabetes: <7.0   12/22/2017 04:50 PM 4.9 4.6 - 6.5 % Final    Comment:    Glycemic Control Guidelines for  People with Diabetes:Non Diabetic:  <6%Goal of Therapy: <7%Additional Action Suggested:  >8%     CBG: Recent Labs  Lab 07/04/20 1936 07/05/20 1945  GLUCAP 133* Pope ACNP-BC [place] Pager # [phone] OR # [phone] if no answer

## 2020-07-07 NOTE — Progress Notes (Signed)
[facility] ADULT ICU REPLACEMENT PROTOCOL   The patient does apply for the [facility] Adult ICU Electrolyte Replacment Protocol based on the criteria listed below:   1. Is GFR >/= 30 ml/min? Yes.    Patient's GFR today is >60 2. Is SCr </= 2? Yes.   Patient's SCr is 0.93 ml/kg/hr 3. Did SCr increase >/= 0.5 in 24 hours? No 4. Abnormal electrolyte(s): K+ 2.9, Phos 1.8 5. Ordered repletion with: Protocol 6. If a panic level lab has been reported, has the CCM MD in charge been notified? Yes.  .   Physician:  Phylis Bougie 07/07/2020 4:57 AM

## 2020-07-07 NOTE — Plan of Care (Signed)

## 2020-07-08 ENCOUNTER — Inpatient Hospital Stay (HOSPITAL_COMMUNITY): Payer: Medicare HMO

## 2020-07-08 DIAGNOSIS — I5021 Acute systolic (congestive) heart failure: Secondary | ICD-10-CM

## 2020-07-08 LAB — BASIC METABOLIC PANEL
Anion gap: 12 (ref 5–15)
BUN: 28 mg/dL — ABNORMAL HIGH (ref 8–23)
CO2: 31 mmol/L (ref 22–32)
Calcium: 9.1 mg/dL (ref 8.9–10.3)
Chloride: 91 mmol/L — ABNORMAL LOW (ref 98–111)
Creatinine, Ser: 1 mg/dL (ref 0.44–1.00)
GFR calc Af Amer: >60 mL/min (ref >60)
GFR calc non Af Amer: 58 mL/min — ABNORMAL LOW (ref >60)
Glucose, Bld: 138 mg/dL — ABNORMAL HIGH (ref 70–99)
Potassium: 4 mmol/L (ref 3.5–5.1)
Sodium: 134 mmol/L — ABNORMAL LOW (ref 135–145)

## 2020-07-08 LAB — CBC WITH DIFFERENTIAL/PLATELET
Abs Immature Granulocytes: 0.09 10*3/uL — ABNORMAL HIGH (ref 0.00–0.07)
Basophils Absolute: 0 10*3/uL (ref 0.0–0.1)
Basophils Relative: 0 %
Eosinophils Absolute: 0 10*3/uL (ref 0.0–0.5)
Eosinophils Relative: 0 %
HCT: 40.7 % (ref 36.0–46.0)
Hemoglobin: 13.2 g/dL (ref 12.0–15.0)
Immature Granulocytes: 1 %
Lymphocytes Relative: 3 %
Lymphs Abs: 0.6 10*3/uL — ABNORMAL LOW (ref 0.7–4.0)
MCH: 30.7 pg (ref 26.0–34.0)
MCHC: 32.4 g/dL (ref 30.0–36.0)
MCV: 94.7 fL (ref 80.0–100.0)
Monocytes Absolute: 0.4 10*3/uL (ref 0.1–1.0)
Monocytes Relative: 2 %
Neutro Abs: 16.1 10*3/uL — ABNORMAL HIGH (ref 1.7–7.7)
Neutrophils Relative %: 94 %
Platelets: 297 10*3/uL (ref 150–400)
RBC: 4.3 MIL/uL (ref 3.87–5.11)
RDW: 11.9 % (ref 11.5–15.5)
WBC: 17.2 10*3/uL — ABNORMAL HIGH (ref 4.0–10.5)
nRBC: 0 % (ref 0.0–0.2)

## 2020-07-08 LAB — PHOSPHORUS: Phosphorus: 2.4 mg/dL — ABNORMAL LOW (ref 2.5–4.6)

## 2020-07-08 LAB — ECHOCARDIOGRAM LIMITED: Weight: 1781.32 oz

## 2020-07-08 LAB — BRAIN NATRIURETIC PEPTIDE: B Natriuretic Peptide: 230.8 pg/mL — ABNORMAL HIGH (ref 0.0–100.0)

## 2020-07-08 MED ORDER — POTASSIUM PHOSPHATES 15 MMOLE/5ML IV SOLN
30.0000 mmol | Freq: Once | INTRAVENOUS | Status: AC
  Administered 2020-07-08: 30 mmol via INTRAVENOUS
  Filled 2020-07-08: qty 10

## 2020-07-08 MED ORDER — K PHOS MONO-SOD PHOS DI & MONO 155-852-130 MG PO TABS
250.0000 mg | ORAL_TABLET | Freq: Two times a day (BID) | ORAL | Status: DC
  Administered 2020-07-08 – 2020-07-09 (×3): 250 mg via ORAL
  Filled 2020-07-08 (×5): qty 1

## 2020-07-08 MED ORDER — OXYCODONE HCL 5 MG PO TABS
10.0000 mg | ORAL_TABLET | Freq: Four times a day (QID) | ORAL | Status: DC | PRN
  Administered 2020-07-08 – 2020-07-10 (×7): 10 mg via ORAL
  Filled 2020-07-08 (×8): qty 2

## 2020-07-08 NOTE — Evaluation (Signed)
Physical Therapy Evaluation Patient Details Name: Denise King MRN: [id number] DOB: 11-25-1951 Today's Date: 07/08/2020   History of Present Illness  Pt adm with acute on chronic hypercarbic respiratory failure suspicious mostly for narcotic over-use in patient with undiagnosed COPD and volume overload. Pt treated with bipap. PMH - heart failure, Crohn's disease, depression, chronic pain, rt femur fx, etoh dependence  Clinical Impression  Pt presents to PT with significant weakness and deconditioning. Expect pt will make steady progress and says she has 24 assist at home.     Follow Up Recommendations Home health PT;Supervision/Assistance - 24 hour    Equipment Recommendations  None recommended by PT    Recommendations for Other Services OT consult     Precautions / Restrictions Precautions Precautions: Fall Restrictions Weight Bearing Restrictions: No      Mobility  Bed Mobility Overal bed mobility: Needs Assistance Bed Mobility: Supine to Sit     Supine to sit: Min assist;HOB elevated     General bed mobility comments: Assist to elevate trunk into sitting.   Transfers Overall transfer level: Needs assistance Equipment used: 1 person hand held assist;4-wheeled walker Transfers: Sit to/from [employer] Transfers Sit to Stand: Min assist Stand pivot transfers: Min assist       General transfer comment: Assist to bring hips up and for balance. Bed to chair with bil forearm support for pivotal steps for bed to bsc.   Ambulation/Gait Ambulation/Gait assistance: Min assist Gait Distance (Feet): 15 Feet Assistive device: 4-wheeled walker Gait Pattern/deviations: Step-to pattern;Decreased step length - right;Decreased step length - left;Shuffle;Trunk flexed Gait velocity: decr Gait velocity interpretation: <1.31 ft/sec, indicative of household ambulator General Gait Details: Assist for balance and support  Stairs            Wheelchair Mobility     Modified Rankin (Stroke Patients Only)       Balance Overall balance assessment: Needs assistance Sitting-balance support: No upper extremity supported;Feet supported Sitting balance-Leahy Scale: Fair     Standing balance support: Bilateral upper extremity supported Standing balance-Leahy Scale: Poor Standing balance comment: rollator and min assist for static standing                             Pertinent Vitals/Pain Pain Assessment: Faces Faces Pain Scale: Hurts even more Pain Location: IV site in RUE Pain Descriptors / Indicators: Burning Pain Intervention(s): Limited activity within patient's tolerance;Premedicated before session;Ice applied    Home Living Family/patient expects to be discharged to:: Private residence Living Arrangements: Spouse/significant other;Children Available Help at Discharge: Family;Available 24 hours/day Type of Home: House Home Access: Stairs to enter Entrance Stairs-Rails: None Entrance Stairs-Number of Steps: 3 Home Layout: One level Home Equipment: Walker - 2 wheels;Walker - 4 wheels;Cane - single point;Shower seat      Prior Function Level of Independence: Independent         Comments: reports no assistive device     Hand Dominance   Dominant Hand: Right    Extremity/Trunk Assessment   Upper Extremity Assessment Upper Extremity Assessment: Defer to OT evaluation    Lower Extremity Assessment Lower Extremity Assessment: Generalized weakness       Communication   Communication: No difficulties  Cognition Arousal/Alertness: Awake/alert Behavior During Therapy: Flat affect Overall Cognitive Status: Within Functional Limits for tasks assessed  General Comments: Initially asleep but once awakened alert.       General Comments General comments (skin integrity, edema, etc.): Pt on 4L of O2 with SpO2 >92%    Exercises     Assessment/Plan    PT Assessment  Patient needs continued PT services  PT Problem List Decreased strength;Decreased activity tolerance;Decreased balance;Decreased mobility       PT Treatment Interventions DME instruction;Gait training;Stair training;Functional mobility training;Therapeutic activities;Therapeutic exercise;Balance training;Patient/family education    PT Goals (Current goals can be found in the Care Plan section)  Acute Rehab PT Goals Patient Stated Goal: return home PT Goal Formulation: With patient Time For Goal Achievement: 07/22/20 Potential to Achieve Goals: Good    Frequency Min 3X/week   Barriers to discharge Inaccessible home environment stairs to enter    Co-evaluation               AM-PAC PT "6 Clicks" Mobility  Outcome Measure Help needed turning from your back to your side while in a flat bed without using bedrails?: None Help needed moving from lying on your back to sitting on the side of a flat bed without using bedrails?: A Little Help needed moving to and from a bed to a chair (including a wheelchair)?: A Little Help needed standing up from a chair using your arms (e.g., wheelchair or bedside chair)?: A Little Help needed to walk in hospital room?: A Little Help needed climbing 3-5 steps with a railing? : A Lot 6 Click Score: 18    End of Session Equipment Utilized During Treatment: Gait belt;Oxygen Activity Tolerance: Patient limited by fatigue Patient left: in chair;with call bell/phone within reach;with family/visitor present Nurse Communication: Mobility status PT Visit Diagnosis: Unsteadiness on feet (R26.81);Other abnormalities of gait and mobility (R26.89);Muscle weakness (generalized) (M62.81)    Time: 6168-3729 PT Time Calculation (min) (ACUTE ONLY): 45 min   Charges:   PT Evaluation $PT Eval Moderate Complexity: 1 Mod PT Treatments $Gait Training: 23-37 mins        [place] Pager [phone] Office  [place] 07/08/2020, 12:05 PM

## 2020-07-08 NOTE — Plan of Care (Signed)

## 2020-07-08 NOTE — Progress Notes (Signed)
  Echocardiogram 2D Echocardiogram has been performed.  Geoffery Lyons [place] 07/08/2020, 8:15 AM

## 2020-07-08 NOTE — Progress Notes (Signed)
PROGRESS NOTE    Denise King  PYP:[id number] DOB: [birth year] DOA: 07/04/2020 PCP: Biagio Borg, MD   Brief Narrative:  Denise King is a 69 year old female with pmhx of HFpEF, chronic pain, bipolar disorder, tobacco use, alcohol abuse who presented with AMS and emesis found to have acute on chronic HF exacerbation with hypercarbia requiring Bipap for respiratory support .   7/8: Bipap initiated for Co2 of 78.4 and pH 7.197  7/9: Weaned off BiPAP.  More hemodynamically stable.  Still hypoxic.  Still hypercarbic.  Plan was to continue nocturnal BiPAP, also decreasing narcotic support 7/10: Hypercarbic with PCO2 almost 80 placed back on BiPAP, later titrated to supplemental oxygen via high flow device titrating FiO2, still lethargic but when she is awake asks for pain. Narcotics stopped.  Then resumed at the family's insistence and after patient repeatedly requested 7/11: Awake not in distress.  Still on high flow oxygen but room to titrate down.  Her baseline CO2 calculates around 55.  A.m. arterial blood gas demonstrates metabolic alkalosis secondary to ongoing diuretics superimposed on her chronic hypercarbic state BNP increased chest x-ray showing bibasilar atelectasis and small effusions.    Assessment & Plan:   Active Problems:   Acute on chronic heart failure (HCC)   Acute on chronic respiratory failure with hypercapnia (HCC)   Acute hypoxic and hypercarbic respiratory failure in setting of acute on chronic heart failure, complicated further by narcotics, also question underlying obstructive lung disease as she does have a history of tobacco abuse:  Last echocardiogram in Feb of 2021 with EF of 60-65%, grade II diastolic dysfunction, elevated left atrial pressures. Patient follows with Dr. Johnsie Cancel for cardiology.  Chest x-ray from yesterday improved.  Currently on 4 L of nasal oxygen.  Did not require any BiPAP last night.  She is only on acetazolamide currently.  She tells  me she takes Lasix 20 mg p.o. every other day at home.  Will obtain chest x-ray today.  Continue acetazolamide.  Based on the chest x-ray, might consider more Lasix today.  BNP significantly improved.  Try to wean oxygen.  Part of her hypoxia is likely secondary to undiagnosed COPD.  Patient on too many narcotics at home.  Currently she has been started on low-dose of oxycodone yesterday based on family's insistence.  Patient's daughter at the bedside.  Patient complains of headache.  Patient and patient's daughter both very much insistent about increasing her oxycodone to 10 mg.  I had a very long discussion about the side effects of narcotics causing respiratory depression but despite of that, they wanted me to increase her narcotics and are willing to take the responsibility if this causes more respiratory issue.  history of depression/bipolar disease and further complicated by chronic pain and history of alcohol use disorder: She continues to complain of pain.  She is very concerned about withdrawal.  Complains of a headache. Continue Wellbutrin, Neurontin, Abilify and Zoloft.  Increased oxycodone to 10 mg every 6 hours as needed per significant insistence from family and patient.  Hypophosphatemia: Ordered IV potassium phosphate this morning but that was hurting the patient.  We will switch on p.o. potassium phosphate.  Leukocytosis: Repeat CBC today.  DVT prophylaxis: enoxaparin (LOVENOX) injection 30 mg Start: 07/04/20 1800   Code Status: Full Code  Family Communication: Daughter present at bedside.  Plan of care discussed with patient in length and she verbalized understanding and agreed with it.  Status is: Inpatient  Remains inpatient appropriate  because:Hemodynamically unstable   Dispo: The patient is from: Home              Anticipated d/c is to: Home              Anticipated d/c date is: 1 day              Patient currently is not medically stable to d/c.        Estimated  body mass index is 20.36 kg/m as calculated from the following:   Height as of 06/17/20: 5\' 2"  (1.575 m).   Weight as of this encounter: 50.5 kg.      Nutritional status:               Consultants:   None  Procedures:   None  Antimicrobials:  Anti-infectives (From admission, onward)   Start     Dose/Rate Route Frequency Ordered Stop   07/04/20 1545  Ampicillin-Sulbactam (UNASYN) 3 g in sodium chloride 0.9 % 100 mL IVPB        3 g 200 mL/hr over 30 Minutes Intravenous  Once 07/04/20 1540 07/04/20 1650         Subjective: Patient seen and examined.  Daughter at the bedside.  Her only complaint is just generalized weakness and headache.  She denied any chest pain or shortness of breath or any other complaint.  Objective: Vitals:   07/08/20 0317 07/08/20 0718 07/08/20 0853 07/08/20 1100  BP:  112/69  113/71  Pulse:  75 76 79  Resp:  17 14 20   Temp: 98.4 F (36.9 C) 97.6 F (36.4 C)  97.9 F (36.6 C)  TempSrc: Oral Oral  Oral  SpO2: 90% 90% 94% 92%  Weight: 50.5 kg       Intake/Output Summary (Last 24 hours) at 07/08/2020 1223 Last data filed at 07/08/2020 0908 Gross per 24 hour  Intake 240 ml  Output 500 ml  Net -260 ml   Filed Weights   07/06/20 0500 07/07/20 0424 07/08/20 0317  Weight: 51.6 kg 48.2 kg 50.5 kg    Examination:  General exam: Appears calm but weak Respiratory system: Clear to auscultation. Respiratory effort normal. Cardiovascular system: S1 & S2 heard, RRR. No JVD, murmurs, rubs, gallops or clicks. No pedal edema. Gastrointestinal system: Abdomen is nondistended, soft and nontender. No organomegaly or masses felt. Normal bowel sounds heard. Central nervous system: Alert and oriented. No focal neurological deficits. Extremities: Symmetric 5 x 5 power. Skin: No rashes, lesions or ulcers Psychiatry: Judgement and insight appear normal. Mood & affect appropriate.    Data Reviewed: I have personally reviewed following labs and  imaging studies  CBC: Recent Labs  Lab 07/04/20 1501 07/04/20 1518 07/04/20 1953 07/04/20 1953 07/05/20 0327 07/06/20 0024 07/06/20 0301 07/07/20 0334 07/07/20 0434  WBC 24.2*  --  26.5*  --  20.4*  --  15.2* 14.8*  --   NEUTROABS 20.3*  --   --   --   --   --   --   --   --   HGB 14.5   < > 13.6   < > 11.7* 13.3 12.4 13.3 13.9  HCT 46.8*   < > 43.2   < > 37.1 39.0 39.4 40.5 41.0  MCV 101.3*  --  98.6  --  97.9  --  96.8 93.3  --   PLT 267  --  218  --  195  --  223 270  --    < > =  values in this interval not displayed.   Basic Metabolic Panel: Recent Labs  Lab 07/04/20 1521 07/04/20 1953 07/05/20 0327 07/06/20 0024 07/06/20 1124 07/06/20 2127 07/07/20 0334 07/07/20 0434 07/08/20 0232  NA   < >  --  138   < > 138 138 138 136 134*  K   < >  --  3.5   < > 3.2* 2.9* 2.9* 3.0* 4.0  CL   < >  --  98  --  87* 85* 86*  --  91*  CO2   < >  --  28  --  39* 38* 37*  --  31  GLUCOSE   < >  --  104*  --  129* 160* 138*  --  138*  BUN   < >  --  19  --  20 19 22   --  28*  CREATININE   < >  --  1.60*  --  0.99 0.89 0.93  --  1.00  CALCIUM   < >  --  7.6*  --  8.1* 8.5* 8.5*  --  9.1  MG  --  1.8 1.8  --   --   --  2.0  --   --   PHOS  --  5.7* 5.6*  --   --   --  1.8*  --  2.4*   < > = values in this interval not displayed.   GFR: Estimated Creatinine Clearance: 43.2 mL/min (by C-G formula based on SCr of 1 mg/dL). Liver Function Tests: Recent Labs  Lab 07/04/20 1501 07/06/20 1124 07/07/20 0334  AST 76* 169* 196*  ALT 24 136* 164*  ALKPHOS 63 56 58  BILITOT 1.4* 0.7 0.9  PROT 6.6 6.5 7.0  ALBUMIN 3.7 3.3* 3.4*   No results for input(s): LIPASE, AMYLASE in the last 168 hours. No results for input(s): AMMONIA in the last 168 hours. Coagulation Profile: Recent Labs  Lab 07/04/20 1501  INR 1.0   Cardiac Enzymes: No results for input(s): CKTOTAL, CKMB, CKMBINDEX, TROPONINI in the last 168 hours. BNP (last 3 results) No results for input(s): PROBNP in the last  8760 hours. HbA1C: No results for input(s): HGBA1C in the last 72 hours. CBG: Recent Labs  Lab 07/04/20 1936 07/05/20 1945  GLUCAP 133* 130*   Lipid Profile: No results for input(s): CHOL, HDL, LDLCALC, TRIG, CHOLHDL, LDLDIRECT in the last 72 hours. Thyroid Function Tests: No results for input(s): TSH, T4TOTAL, FREET4, T3FREE, THYROIDAB in the last 72 hours. Anemia Panel: No results for input(s): VITAMINB12, FOLATE, FERRITIN, TIBC, IRON, RETICCTPCT in the last 72 hours. Sepsis Labs: No results for input(s): PROCALCITON, LATICACIDVEN in the last 168 hours.  Recent Results (from the past 240 hour(s))  SARS Coronavirus 2 by RT PCR (hospital order, performed in [facility] hospital lab) Nasopharyngeal Nasopharyngeal Swab     Status: None   Collection Time: 07/04/20  3:11 PM   Specimen: Nasopharyngeal Swab  Result Value Ref Range Status   SARS Coronavirus 2 NEGATIVE NEGATIVE Final    Comment: (NOTE) SARS-CoV-2 target nucleic acids are NOT DETECTED.  The SARS-CoV-2 RNA is generally detectable in upper and lower respiratory specimens during the acute phase of infection. The lowest concentration of SARS-CoV-2 viral copies this assay can detect is 250 copies / mL. A negative result does not preclude SARS-CoV-2 infection and should not be used as the sole basis for treatment or other patient management decisions.  A negative result may occur with improper  specimen collection / handling, submission of specimen other than nasopharyngeal swab, presence of viral mutation(s) within the areas targeted by this assay, and inadequate number of viral copies (<250 copies / mL). A negative result must be combined with clinical observations, patient history, and epidemiological information.  Fact Sheet for Patients:   StrictlyIdeas.no  Fact Sheet for Healthcare Providers: BankingDealers.co.za  This test is not yet approved or  cleared by the [place] FDA and has been authorized for detection and/or diagnosis of SARS-CoV-2 by FDA under an Emergency Use Authorization (EUA).  This EUA will remain in effect (meaning this test can be used) for the duration of the COVID-19 declaration under Section 564(b)(1) of the Act, 21 U.S.C. section 360bbb-3(b)(1), unless the authorization is terminated or revoked sooner.  Performed at [facility], [place] [address] [place], [place] [address]   Culture, blood (routine x 2)     Status: None (Preliminary result)   Collection Time: 07/04/20  7:53 PM   Specimen: BLOOD  Result Value Ref Range Status   Specimen Description BLOOD RIGHT ANTECUBITAL  Final   Special Requests   Final    BOTTLES DRAWN AEROBIC ONLY Blood Culture adequate volume   Culture   Final    NO GROWTH 3 DAYS Performed at [facility], [place] [address], [place], [place] [address]    Report Status PENDING  Incomplete  MRSA PCR Screening     Status: None   Collection Time: 07/04/20  7:54 PM   Specimen: Nasal Mucosa; Nasopharyngeal  Result Value Ref Range Status   MRSA by PCR NEGATIVE NEGATIVE Final    Comment:        The GeneXpert MRSA Assay (FDA approved for NASAL specimens only), is one component of a comprehensive MRSA colonization surveillance program. It is not intended to diagnose MRSA infection nor to guide or monitor treatment for MRSA infections. Performed at [facility], [place] [address], [place], [place] [address]   Culture, blood (routine x 2)     Status: None (Preliminary result)   Collection Time: 07/04/20  7:56 PM   Specimen: BLOOD LEFT HAND  Result Value Ref Range Status   Specimen Description BLOOD LEFT HAND  Final   Special Requests   Final    BOTTLES DRAWN AEROBIC ONLY Blood Culture adequate volume   Culture   Final    NO GROWTH 3 DAYS Performed at [facility], [place] [address], [place],  [address]    Report Status PENDING  Incomplete      Radiology Studies: No  results found.  Scheduled Meds: . ARIPiprazole  2 mg Oral Daily  . aspirin EC  81 mg Oral Daily  . buPROPion  150 mg Oral Daily  . chlorhexidine  15 mL Mouth Rinse BID  . Chlorhexidine Gluconate Cloth  6 each Topical Daily  . cloNIDine  0.1 mg Transdermal Weekly  . enoxaparin (LOVENOX) injection  30 mg Subcutaneous Q24H  . folic acid  1 mg Oral Daily  . gabapentin  100 mg Oral TID  . ipratropium-albuterol  3 mL Nebulization BID  . lidocaine  1 patch Transdermal Q24H  . mouth rinse  15 mL Mouth Rinse q12n4p  . metoCLOPramide (REGLAN) injection  10 mg Intravenous Q6H  . pantoprazole  40 mg Oral BID WC  . predniSONE  40 mg Oral Q breakfast  . rosuvastatin  5 mg Oral Q M,W,F-1800  . sertraline  200 mg Oral QHS  .  spironolactone  12.5 mg Oral Daily  . vitamin B-12  100 mcg Oral QPC breakfast   Continuous Infusions: . potassium PHOSPHATE IVPB (in mmol) 30 mmol (07/08/20 0908)     LOS: 4 days   Time spent: 37 minutes   Darliss Cheney, MD [facility]  07/08/2020, 12:23 PM   To contact the attending provider between 7A-7P or the covering provider during after hours 7P-7A, please log into the web site [email or web address].

## 2020-07-09 DIAGNOSIS — I5033 Acute on chronic diastolic (congestive) heart failure: Secondary | ICD-10-CM

## 2020-07-09 LAB — CBC WITH DIFFERENTIAL/PLATELET
Abs Immature Granulocytes: 0.08 10*3/uL — ABNORMAL HIGH (ref 0.00–0.07)
Basophils Absolute: 0 10*3/uL (ref 0.0–0.1)
Basophils Relative: 0 %
Eosinophils Absolute: 0 10*3/uL (ref 0.0–0.5)
Eosinophils Relative: 0 %
HCT: 37.9 % (ref 36.0–46.0)
Hemoglobin: 12.4 g/dL (ref 12.0–15.0)
Immature Granulocytes: 1 %
Lymphocytes Relative: 19 %
Lymphs Abs: 2.6 10*3/uL (ref 0.7–4.0)
MCH: 30.6 pg (ref 26.0–34.0)
MCHC: 32.7 g/dL (ref 30.0–36.0)
MCV: 93.6 fL (ref 80.0–100.0)
Monocytes Absolute: 0.7 10*3/uL (ref 0.1–1.0)
Monocytes Relative: 5 %
Neutro Abs: 10.6 10*3/uL — ABNORMAL HIGH (ref 1.7–7.7)
Neutrophils Relative %: 75 %
Platelets: 286 10*3/uL (ref 150–400)
RBC: 4.05 MIL/uL (ref 3.87–5.11)
RDW: 11.9 % (ref 11.5–15.5)
WBC: 14.1 10*3/uL — ABNORMAL HIGH (ref 4.0–10.5)
nRBC: 0 % (ref 0.0–0.2)

## 2020-07-09 LAB — CULTURE, BLOOD (ROUTINE X 2)
Culture: NO GROWTH
Culture: NO GROWTH
Special Requests: ADEQUATE
Special Requests: ADEQUATE

## 2020-07-09 LAB — BASIC METABOLIC PANEL
Anion gap: 14 (ref 5–15)
BUN: 28 mg/dL — ABNORMAL HIGH (ref 8–23)
CO2: 28 mmol/L (ref 22–32)
Calcium: 9.7 mg/dL (ref 8.9–10.3)
Chloride: 92 mmol/L — ABNORMAL LOW (ref 98–111)
Creatinine, Ser: 0.97 mg/dL (ref 0.44–1.00)
GFR calc Af Amer: >60 mL/min (ref >60)
GFR calc non Af Amer: >60 mL/min (ref >60)
Glucose, Bld: 87 mg/dL (ref 70–99)
Potassium: 3.8 mmol/L (ref 3.5–5.1)
Sodium: 134 mmol/L — ABNORMAL LOW (ref 135–145)

## 2020-07-09 LAB — PHOSPHORUS: Phosphorus: 6.6 mg/dL — ABNORMAL HIGH (ref 2.5–4.6)

## 2020-07-09 LAB — MAGNESIUM: Magnesium: 1.9 mg/dL (ref 1.7–2.4)

## 2020-07-09 LAB — BRAIN NATRIURETIC PEPTIDE: B Natriuretic Peptide: 97.5 pg/mL (ref 0.0–100.0)

## 2020-07-09 MED ORDER — FUROSEMIDE 10 MG/ML IJ SOLN
20.0000 mg | Freq: Once | INTRAMUSCULAR | Status: AC
  Administered 2020-07-09: 20 mg via INTRAVENOUS
  Filled 2020-07-09: qty 2

## 2020-07-09 MED ORDER — GUAIFENESIN 100 MG/5ML PO SOLN
5.0000 mL | ORAL | Status: DC | PRN
  Administered 2020-07-09 – 2020-07-10 (×3): 100 mg via ORAL
  Filled 2020-07-09 (×2): qty 5

## 2020-07-09 NOTE — Progress Notes (Signed)
Desats to 80's on room air, o2 3L high flow applied. sats 90's.

## 2020-07-09 NOTE — Plan of Care (Signed)
  Problem: Health Behavior/Discharge Planning: Goal: Ability to manage health-related needs will improve Outcome: Progressing   Problem: Clinical Measurements: Goal: Ability to maintain clinical measurements within normal limits will improve Outcome: Progressing   Problem: Clinical Measurements: Goal: Diagnostic test results will improve Outcome: Progressing   Problem: Clinical Measurements: Goal: Respiratory complications will improve Outcome: Progressing   Problem: Activity: Goal: Risk for activity intolerance will decrease Outcome: Progressing   Problem: Nutrition: Goal: Adequate nutrition will be maintained Outcome: Progressing   Problem: Coping: Goal: Level of anxiety will decrease Outcome: Progressing   Problem: Pain Managment: Goal: General experience of comfort will improve Outcome: Progressing

## 2020-07-09 NOTE — Progress Notes (Signed)
PROGRESS NOTE    Denise King  KGU:[id number] DOB: [birth year] DOA: 07/04/2020 PCP: Biagio Borg, MD   Brief Narrative:  Denise King is a 69 year old female with pmhx of HFpEF, chronic pain, bipolar disorder, tobacco use, alcohol abuse who presented with AMS and emesis found to have acute on chronic HF exacerbation with hypercarbia requiring Bipap for respiratory support .   7/8: Bipap initiated for Co2 of 78.4 and pH 7.197  7/9: Weaned off BiPAP.  More hemodynamically stable.  Still hypoxic.  Still hypercarbic.  Plan was to continue nocturnal BiPAP, also decreasing narcotic support 7/10: Hypercarbic with PCO2 almost 80 placed back on BiPAP, later titrated to supplemental oxygen via high flow device titrating FiO2, still lethargic but when she is awake asks for pain. Narcotics stopped.  Then resumed at the family's insistence and after patient repeatedly requested 7/11: Awake not in distress.  Still on high flow oxygen but room to titrate down.  Her baseline CO2 calculates around 55.  A.m. arterial blood gas demonstrates metabolic alkalosis secondary to ongoing diuretics superimposed on her chronic hypercarbic state BNP increased chest x-ray showing bibasilar atelectasis and small effusions.    Assessment & Plan:   Active Problems:   Acute on chronic heart failure (HCC)   Acute on chronic respiratory failure with hypercapnia (HCC)   Acute hypoxic and hypercarbic respiratory failure in setting of acute on chronic heart failure, complicated further by narcotics, also question underlying obstructive lung disease as she does have a history of tobacco abuse:  Last echocardiogram in Feb of 2021 with EF of 60-65%, grade II diastolic dysfunction, elevated left atrial pressures. Patient follows with Dr. Johnsie Cancel for cardiology.  Chest x-ray from yesterday improved.  Currently on 3 L of nasal oxygen.  Did not require any BiPAP last night.  She is only on acetazolamide currently.  She tells  me she takes Lasix 20 mg p.o. every other day at home.  Chest x-ray does not show any pulmonary edema and although she is requiring less oxygen than yesterday but still feels shortness of breath and dropped oxygen saturation to 83% on room air so we will give her a dose of Lasix 20 mg IV.  Continue acetazolamide. BNP completely normal today.  Try to wean oxygen.  Part of her hypoxia is likely secondary to undiagnosed COPD.  Patient on too many narcotics at home.  Continue prednisone for another day.  She also told me that when she was hospitalized in January, she was sent home on oxygen and used it for 3 weeks.  I suspect that she may end up going on home oxygen this time 2.  history of depression/bipolar disease and further complicated by chronic pain and history of alcohol use disorder: She continues to complain of pain.  She is very concerned about withdrawal.  Complains of a headache. Continue Wellbutrin, Neurontin, Abilify and Zoloft.  Increased oxycodone to 10 mg every 6 hours as needed per significant insistence from family and patient on 07/08/2020.  Hypophosphatemia: Continue oral replacement and waiting for this morning's labs.  Leukocytosis: Improving.  Likely some component due to steroid.  DVT prophylaxis: enoxaparin (LOVENOX) injection 30 mg Start: 07/04/20 1800   Code Status: Full Code  Family Communication: No family present at bedside.  Plan of care discussed with patient in length and she verbalized understanding and agreed with it.   Status is: Inpatient  Remains inpatient appropriate because:Inpatient level of care appropriate due to severity of illness  Dispo: The patient is from: Home              Anticipated d/c is to: Home              Anticipated d/c date is: 1 day              Patient currently is medically stable to d/c.     Estimated body mass index is 20.04 kg/m as calculated from the following:   Height as of 06/17/20: 5\' 2"  (1.575 m).   Weight as of this  encounter: 49.7 kg.      Nutritional status:               Consultants:   None  Procedures:   None  Antimicrobials:  Anti-infectives (From admission, onward)   Start     Dose/Rate Route Frequency Ordered Stop   07/04/20 1545  Ampicillin-Sulbactam (UNASYN) 3 g in sodium chloride 0.9 % 100 mL IVPB        3 g 200 mL/hr over 30 Minutes Intravenous  Once 07/04/20 1540 07/04/20 1650         Subjective: Patient seen and examined there are no family member present at bedside.  She complains of some shortness of breath which is improving but continues to feel weak.  No new complaint.  Objective: Vitals:   07/09/20 0558 07/09/20 0733 07/09/20 0848 07/09/20 0854  BP:  102/67    Pulse:  77 79   Resp:  19 14   Temp:  98 F (36.7 C)    TempSrc:  Oral    SpO2:  90% (!) 87% 91%  Weight: 49.7 kg       Intake/Output Summary (Last 24 hours) at 07/09/2020 1048 Last data filed at 07/09/2020 0345 Gross per 24 hour  Intake 105.77 ml  Output 1100 ml  Net -994.23 ml   Filed Weights   07/07/20 0424 07/08/20 0317 07/09/20 0558  Weight: 48.2 kg 50.5 kg 49.7 kg    Examination:  General exam: Appears calm and comfortable  Respiratory system: Clear to auscultation. Respiratory effort normal. Cardiovascular system: S1 & S2 heard, RRR. No JVD, murmurs, rubs, gallops or clicks. No pedal edema. Gastrointestinal system: Abdomen is nondistended, soft and nontender. No organomegaly or masses felt. Normal bowel sounds heard. Central nervous system: Alert and oriented. No focal neurological deficits. Extremities: Symmetric 5 x 5 power. Skin: No rashes, lesions or ulcers.  Psychiatry: Judgement and insight appear normal. Mood & affect appropriate.   Data Reviewed: I have personally reviewed following labs and imaging studies  CBC: Recent Labs  Lab 07/04/20 1501 07/04/20 1518 07/05/20 0327 07/06/20 0024 07/06/20 0301 07/07/20 0334 07/07/20 0434 07/08/20 1300 07/09/20 0802    WBC 24.2*   < > 20.4*  --  15.2* 14.8*  --  17.2* 14.1*  NEUTROABS 20.3*  --   --   --   --   --   --  16.1* 10.6*  HGB 14.5   < > 11.7*   < > 12.4 13.3 13.9 13.2 12.4  HCT 46.8*   < > 37.1   < > 39.4 40.5 41.0 40.7 37.9  MCV 101.3*   < > 97.9  --  96.8 93.3  --  94.7 93.6  PLT 267   < > 195  --  223 270  --  297 286   < > = values in this interval not displayed.   Basic Metabolic Panel: Recent Labs  Lab 07/04/20 1521 07/04/20  1953 07/05/20 0327 07/06/20 0024 07/06/20 1124 07/06/20 2127 07/07/20 0334 07/07/20 0434 07/08/20 0232  NA   < >  --  138   < > 138 138 138 136 134*  K   < >  --  3.5   < > 3.2* 2.9* 2.9* 3.0* 4.0  CL   < >  --  98  --  87* 85* 86*  --  91*  CO2   < >  --  28  --  39* 38* 37*  --  31  GLUCOSE   < >  --  104*  --  129* 160* 138*  --  138*  BUN   < >  --  19  --  20 19 22   --  28*  CREATININE   < >  --  1.60*  --  0.99 0.89 0.93  --  1.00  CALCIUM   < >  --  7.6*  --  8.1* 8.5* 8.5*  --  9.1  MG  --  1.8 1.8  --   --   --  2.0  --   --   PHOS  --  5.7* 5.6*  --   --   --  1.8*  --  2.4*   < > = values in this interval not displayed.   GFR: Estimated Creatinine Clearance: 42.8 mL/min (by C-G formula based on SCr of 1 mg/dL). Liver Function Tests: Recent Labs  Lab 07/04/20 1501 07/06/20 1124 07/07/20 0334  AST 76* 169* 196*  ALT 24 136* 164*  ALKPHOS 63 56 58  BILITOT 1.4* 0.7 0.9  PROT 6.6 6.5 7.0  ALBUMIN 3.7 3.3* 3.4*   No results for input(s): LIPASE, AMYLASE in the last 168 hours. No results for input(s): AMMONIA in the last 168 hours. Coagulation Profile: Recent Labs  Lab 07/04/20 1501  INR 1.0   Cardiac Enzymes: No results for input(s): CKTOTAL, CKMB, CKMBINDEX, TROPONINI in the last 168 hours. BNP (last 3 results) No results for input(s): PROBNP in the last 8760 hours. HbA1C: No results for input(s): HGBA1C in the last 72 hours. CBG: Recent Labs  Lab 07/04/20 1936 07/05/20 1945  GLUCAP 133* 130*   Lipid Profile: No  results for input(s): CHOL, HDL, LDLCALC, TRIG, CHOLHDL, LDLDIRECT in the last 72 hours. Thyroid Function Tests: No results for input(s): TSH, T4TOTAL, FREET4, T3FREE, THYROIDAB in the last 72 hours. Anemia Panel: No results for input(s): VITAMINB12, FOLATE, FERRITIN, TIBC, IRON, RETICCTPCT in the last 72 hours. Sepsis Labs: No results for input(s): PROCALCITON, LATICACIDVEN in the last 168 hours.  Recent Results (from the past 240 hour(s))  SARS Coronavirus 2 by RT PCR (hospital order, performed in [facility] hospital lab) Nasopharyngeal Nasopharyngeal Swab     Status: None   Collection Time: 07/04/20  3:11 PM   Specimen: Nasopharyngeal Swab  Result Value Ref Range Status   SARS Coronavirus 2 NEGATIVE NEGATIVE Final    Comment: (NOTE) SARS-CoV-2 target nucleic acids are NOT DETECTED.  The SARS-CoV-2 RNA is generally detectable in upper and lower respiratory specimens during the acute phase of infection. The lowest concentration of SARS-CoV-2 viral copies this assay can detect is 250 copies / mL. A negative result does not preclude SARS-CoV-2 infection and should not be used as the sole basis for treatment or other patient management decisions.  A negative result may occur with improper specimen collection / handling, submission of specimen other than nasopharyngeal swab, presence of viral mutation(s) within the areas  targeted by this assay, and inadequate number of viral copies (<250 copies / mL). A negative result must be combined with clinical observations, patient history, and epidemiological information.  Fact Sheet for Patients:   StrictlyIdeas.no  Fact Sheet for Healthcare Providers: BankingDealers.co.za  This test is not yet approved or  cleared by the [place] FDA and has been authorized for detection and/or diagnosis of SARS-CoV-2 by FDA under an Emergency Use Authorization (EUA).  This EUA will remain in effect  (meaning this test can be used) for the duration of the COVID-19 declaration under Section 564(b)(1) of the Act, 21 U.S.C. section 360bbb-3(b)(1), unless the authorization is terminated or revoked sooner.  Performed at [facility], [place] [address], [place], [place] [address]   Culture, blood (routine x 2)     Status: None (Preliminary result)   Collection Time: 07/04/20  7:53 PM   Specimen: BLOOD  Result Value Ref Range Status   Specimen Description BLOOD RIGHT ANTECUBITAL  Final   Special Requests   Final    BOTTLES DRAWN AEROBIC ONLY Blood Culture adequate volume   Culture   Final    NO GROWTH 4 DAYS Performed at [facility], [place] [address] [place], [place] [address]    Report Status PENDING  Incomplete  MRSA PCR Screening     Status: None   Collection Time: 07/04/20  7:54 PM   Specimen: Nasal Mucosa; Nasopharyngeal  Result Value Ref Range Status   MRSA by PCR NEGATIVE NEGATIVE Final    Comment:        The GeneXpert MRSA Assay (FDA approved for NASAL specimens only), is one component of a comprehensive MRSA colonization surveillance program. It is not intended to diagnose MRSA infection nor to guide or monitor treatment for MRSA infections. Performed at [facility], [place] [address], [place], [place] [address]   Culture, blood (routine x 2)     Status: None (Preliminary result)   Collection Time: 07/04/20  7:56 PM   Specimen: BLOOD LEFT HAND  Result Value Ref Range Status   Specimen Description BLOOD LEFT HAND  Final   Special Requests   Final    BOTTLES DRAWN AEROBIC ONLY Blood Culture adequate volume   Culture   Final    NO GROWTH 4 DAYS Performed at [facility], [place] [address] [place], [place] [address]    Report Status PENDING  Incomplete      Radiology Studies: DG CHEST PORT 1 VIEW  Result Date: 07/08/2020 CLINICAL DATA:  CHF. EXAM: PORTABLE CHEST 1 VIEW COMPARISON:  Radiograph 07/06/2020. chest CT 12/23/2019 FINDINGS: Patient  is rotated limiting assessment. Cardiomegaly is grossly stable. Mediastinal contours grossly stable. Improving interstitial opacities likely resolving pulmonary edema. Persistent ill-defined opacity in the right infrahilar region. Streaky left lung base opacities. No pneumothorax. Stable osseous structures. IMPRESSION: 1. Improving interstitial opacities likely resolving pulmonary edema. 2. Persistent ill-defined opacity in the right infrahilar region, may represent atelectasis or pneumonia. Streaky left lung base opacities, favor atelectasis. Electronically Signed   By: Keith Rake M.D.   On: 07/08/2020 17:54   ECHOCARDIOGRAM LIMITED  Result Date: 07/08/2020    ECHOCARDIOGRAM LIMITED REPORT   Patient Name:   AHRI OLSON Date of Exam: 07/08/2020 Medical Rec #:  [id number]           Height:       62.0 in Accession #:    [id number]  Weight:       111.3 lb Date of Birth:  [birth year]           BSA:          1.490 m Patient Age:    69 years            BP:           112/69 mmHg Patient Gender: F                   HR:           75 bpm. Exam Location:  Inpatient Procedure: Limited Echo, Cardiac Doppler and Color Doppler Indications:    CHF-Acute Systolic 353.61 / W43.15  History:        Patient has prior history of Echocardiogram examinations, most                 recent 02/01/2020. CHF, Signs/Symptoms:Chest Pain and Shortness of                 Breath; Risk Factors:Current Smoker. GERD.  Sonographer:    Vickie Epley RDCS Referring Phys: [id number] [place]  1. Left ventricular ejection fraction, by estimation, is 60 to 65%. The left ventricle has normal function. The left ventricle has no regional wall motion abnormalities. Left ventricular diastolic parameters are consistent with Grade I diastolic dysfunction (impaired relaxation).  2. Right ventricular systolic function is normal. The right ventricular size is normal.  3. The mitral valve is normal in structure. Trivial mitral valve  regurgitation. No evidence of mitral stenosis.  4. The aortic valve is tricuspid. Aortic valve regurgitation is not visualized. No aortic stenosis is present.  5. The inferior vena cava is normal in size with greater than 50% respiratory variability, suggesting right atrial pressure of 3 mmHg. FINDINGS  Left Ventricle: Left ventricular ejection fraction, by estimation, is 60 to 65%. The left ventricle has normal function. The left ventricle has no regional wall motion abnormalities. The left ventricular internal cavity size was normal in size. There is  no left ventricular hypertrophy. Right Ventricle: The right ventricular size is normal. Right ventricular systolic function is normal. Left Atrium: Left atrial size was normal in size. Right Atrium: Right atrial size was normal in size. Pericardium: There is no evidence of pericardial effusion. Mitral Valve: The mitral valve is normal in structure. Normal mobility of the mitral valve leaflets. Trivial mitral valve regurgitation. No evidence of mitral valve stenosis. Tricuspid Valve: The tricuspid valve is normal in structure. Tricuspid valve regurgitation is trivial. No evidence of tricuspid stenosis. Aortic Valve: The aortic valve is tricuspid. Aortic valve regurgitation is not visualized. No aortic stenosis is present. Pulmonic Valve: The pulmonic valve was normal in structure. Pulmonic valve regurgitation is not visualized. No evidence of pulmonic stenosis. Aorta: The aortic root is normal in size and structure. Venous: The inferior vena cava is normal in size with greater than 50% respiratory variability, suggesting right atrial pressure of 3 mmHg.  LEFT VENTRICLE PLAX 2D LVIDd:         3.90 cm     Diastology LVIDs:         2.70 cm     LV e' lateral:   9.20 cm/s LV PW:         0.90 cm     LV E/e' lateral: 7.2 LV IVS:        0.90 cm     LV e' medial:    5.43 cm/s  LVOT diam:     2.00 cm     LV E/e' medial:  12.2 LV SV:         67 LV SV Index:   45 LVOT Area:      3.14 cm  LV Volumes (MOD) LV vol d, MOD A2C: 56.6 ml LV vol d, MOD A4C: 68.0 ml LV vol s, MOD A2C: 22.6 ml LV vol s, MOD A4C: 31.5 ml LV SV MOD A2C:     34.0 ml LV SV MOD A4C:     68.0 ml LV SV MOD BP:      35.4 ml LEFT ATRIUM         Index LA diam:    2.60 cm 1.74 cm/m  AORTIC VALVE LVOT Vmax:   110.00 cm/s LVOT Vmean:  75.600 cm/s LVOT VTI:    0.214 m  AORTA Ao Root diam: 2.90 cm MITRAL VALVE MV Area (PHT): 2.39 cm    SHUNTS MV Decel Time: 317 msec    Systemic VTI:  0.21 m MV E velocity: 66.40 cm/s  Systemic Diam: 2.00 cm MV A velocity: 88.70 cm/s MV E/A ratio:  0.75 Kirk Ruths MD Electronically signed by Kirk Ruths MD Signature Date/Time: 07/08/2020/12:40:03 PM    Final     Scheduled Meds: . ARIPiprazole  2 mg Oral Daily  . aspirin EC  81 mg Oral Daily  . buPROPion  150 mg Oral Daily  . chlorhexidine  15 mL Mouth Rinse BID  . Chlorhexidine Gluconate Cloth  6 each Topical Daily  . cloNIDine  0.1 mg Transdermal Weekly  . enoxaparin (LOVENOX) injection  30 mg Subcutaneous Q24H  . folic acid  1 mg Oral Daily  . furosemide  20 mg Intravenous Once  . gabapentin  100 mg Oral TID  . ipratropium-albuterol  3 mL Nebulization BID  . lidocaine  1 patch Transdermal Q24H  . mouth rinse  15 mL Mouth Rinse q12n4p  . pantoprazole  40 mg Oral BID WC  . phosphorus  250 mg Oral BID  . predniSONE  40 mg Oral Q breakfast  . rosuvastatin  5 mg Oral Q M,W,F-1800  . sertraline  200 mg Oral QHS  . spironolactone  12.5 mg Oral Daily  . vitamin B-12  100 mcg Oral QPC breakfast   Continuous Infusions:    LOS: 5 days   Time spent: The minutes   Darliss Cheney, MD [facility]  07/09/2020, 10:48 AM   To contact the attending provider between 7A-7P or the covering provider during after hours 7P-7A, please log into the web site [email or web address].

## 2020-07-10 DIAGNOSIS — J9621 Acute and chronic respiratory failure with hypoxia: Secondary | ICD-10-CM

## 2020-07-10 DIAGNOSIS — F17200 Nicotine dependence, unspecified, uncomplicated: Secondary | ICD-10-CM

## 2020-07-10 DIAGNOSIS — J9622 Acute and chronic respiratory failure with hypercapnia: Secondary | ICD-10-CM | POA: Diagnosis present

## 2020-07-10 LAB — CBC WITH DIFFERENTIAL/PLATELET
Abs Immature Granulocytes: 0.12 10*3/uL — ABNORMAL HIGH (ref 0.00–0.07)
Basophils Absolute: 0 10*3/uL (ref 0.0–0.1)
Basophils Relative: 0 %
Eosinophils Absolute: 0 10*3/uL (ref 0.0–0.5)
Eosinophils Relative: 0 %
HCT: 41.2 % (ref 36.0–46.0)
Hemoglobin: 14 g/dL (ref 12.0–15.0)
Immature Granulocytes: 1 %
Lymphocytes Relative: 26 %
Lymphs Abs: 3.5 10*3/uL (ref 0.7–4.0)
MCH: 31.4 pg (ref 26.0–34.0)
MCHC: 34 g/dL (ref 30.0–36.0)
MCV: 92.4 fL (ref 80.0–100.0)
Monocytes Absolute: 1.6 10*3/uL — ABNORMAL HIGH (ref 0.1–1.0)
Monocytes Relative: 12 %
Neutro Abs: 8.4 10*3/uL — ABNORMAL HIGH (ref 1.7–7.7)
Neutrophils Relative %: 61 %
Platelets: 319 10*3/uL (ref 150–400)
RBC: 4.46 MIL/uL (ref 3.87–5.11)
RDW: 11.9 % (ref 11.5–15.5)
WBC: 13.6 10*3/uL — ABNORMAL HIGH (ref 4.0–10.5)
nRBC: 0 % (ref 0.0–0.2)

## 2020-07-10 LAB — BASIC METABOLIC PANEL
Anion gap: 14 (ref 5–15)
BUN: 27 mg/dL — ABNORMAL HIGH (ref 8–23)
CO2: 26 mmol/L (ref 22–32)
Calcium: 9.7 mg/dL (ref 8.9–10.3)
Chloride: 96 mmol/L — ABNORMAL LOW (ref 98–111)
Creatinine, Ser: 0.87 mg/dL (ref 0.44–1.00)
GFR calc Af Amer: >60 mL/min (ref >60)
GFR calc non Af Amer: >60 mL/min (ref >60)
Glucose, Bld: 124 mg/dL — ABNORMAL HIGH (ref 70–99)
Potassium: 3.3 mmol/L — ABNORMAL LOW (ref 3.5–5.1)
Sodium: 136 mmol/L (ref 135–145)

## 2020-07-10 LAB — BRAIN NATRIURETIC PEPTIDE: B Natriuretic Peptide: 96.9 pg/mL (ref 0.0–100.0)

## 2020-07-10 NOTE — Progress Notes (Signed)
All set for discharge home , awaiting for husband to bring O2 canister. Discharge instructions given to pt. Belongings given to pt.

## 2020-07-10 NOTE — Discharge Summary (Signed)
Physician Discharge Summary  RYLEEANN URQUIZA CZY:[id number] DOB: 09-07-1951 DOA: 07/04/2020  PCP: Biagio Borg, MD  Admit date: 07/04/2020 Discharge date: 07/10/2020  Admitted From: Home  Disposition: Home  Recommendations for Outpatient Follow-up:  1. Follow up with PCP in 1-2 weeks 2. Follow with cardiology in 1 to 2 weeks 3. Please arrange for outpatient pulmonary function test through PCP. 4. Please obtain BMP/CBC in one week 5. Please follow up with your PCP on the following pending results: Unresulted Labs (From admission, onward) Comment          Start     Ordered   07/11/20 0500  Creatinine, serum  (enoxaparin (LOVENOX)    CrCl < 30 ml/min)  Weekly,   R     Comments: while on enoxaparin therapy.    07/04/20 1728   07/10/20 0932  Basic metabolic panel  Once,   R       Question:  Specimen collection method  Answer:  Lab=Lab collect   07/10/20 0736   07/06/20 1012  Brain natriuretic peptide  Daily,   R     Question:  Specimen collection method  Answer:  Lab=Lab collect   07/06/20 1011           Home Health:  yes Equipment/Devices: Home oxygen  Discharge Condition: Stable CODE STATUS: Full code Diet recommendation: Low-sodium  Subjective: Patient seen and examined.  Still complains of weakness but not willing to go to SNF and wants to go home instead.  Still complains of pleuritic chest pain but that is getting better.  Brief/Interim Summary: FLORITA NITSCH a 69 year old female with pmhx of HFpEF, chronic pain, bipolar disorder, tobacco use, alcohol abuse who presented with AMS and emesis found to have acute on chronic HF exacerbation with hypercarbia requiring Bipap for respiratory support .   According to HPI, she was in her normal state of health until 2 days before coming to the ED and reportedly patient was leaning to her right side, her hands were shaky, and she was experiencing some dizziness.. Patient had no facial droop , slurred speech or  focal  deficits at that time according to her daughter and thought the leaning was  secondary to chronic sciatic pain and chronic pain from right femur fracture in October 2019. Acute event happened the morning of presentation when her other daughter found her vomiting. She seemed altered and was helped back to bed. She slept until 2 pm and could not be woken up initially. CPR was attempted and patient woke up. EMS found her in bed with emesis. Noted not to have no focal deficits and responding to verbal stimuli.  She was brought to the emergency department.  She was found to be having acute hypoxic and hypercapnic respiratory failure.  Chest x-ray was compatible with pulmonary edema. Bipap initiated for Co2 of 78.4 and pH 7.197  She was then she was then weaned off BiPAP on 07/05/20. She was started on iv lasix. nocturnal BiPAP was continued for some time.  On 7/10, Hypercarbic with PCO2 almost 80 placed back on BiPAP, later titrated to supplemental oxygen via high flow device titrating FiO2, still lethargic but when she was awake she asks for pain pills. Narcotics were stopped as they were thought to be contributing to her hypercarbia along with undiagnosed COPD as she is long term smoker.low dose narcotics were resumed per family's strong insistence as patient was constantly complaining of headache and pain. She was titrated back to [facility]  oxygen and at that point in time she was transferred to floor under care of hospitalist on 07/08/20. She was then seen by PT/OT and they recommended HHPT. She has improved significantly but still c/o some chest pain and tenderness due to CPR performed pre admission. Now on 2 liter of oxygen. She told me that back in Jan 2021 when she was hospitalized, she was discharged on home O2 and she required it for 3 weeks and still has equipment at home. She also had intermittent hypokalemia this hospitalization which was replenished. She was also started on prednisone for 4 days for possible copd  component. She is now stable for discharge, although due to weakness that she still complains, she was offered discharge to SNF but she declined that. In the past, she was taken off of lasix by her cardiologist due to persistent hypokalemia so she is being discharged back on her kcl and aldactone and will f/u with her cardiologist.   Discharge Diagnoses:  Active Problems:   CIGARETTE SMOKER   Acute on chronic heart failure with preserved ejection fraction (HCC)   Acute on chronic heart failure (HCC)   Acute on chronic respiratory failure with hypoxia and hypercapnia (HCC)    Discharge Instructions   Allergies as of 07/10/2020      Reactions   Atorvastatin Nausea And Vomiting   Levofloxacin Other (See Comments)   Reaction: achilles tendon pain   Omnicef [cefdinir] Diarrhea   Uncontrollable diarrhea   Trazodone And Nefazodone Other (See Comments)   "Felt like I was going to faint"   Sulfa Antibiotics Rash   Sulfonamide Derivatives Rash      Medication List    STOP taking these medications   morphine 60 MG 12 hr tablet Commonly known as: MS CONTIN     TAKE these medications   ALPRAZolam 0.25 MG tablet Commonly known as: XANAX Take 1 tablet (0.25 mg total) by mouth 3 (three) times daily as needed for anxiety.   ARIPiprazole 2 MG tablet Commonly known as: ABILIFY Take 1 tablet (2 mg total) by mouth daily.   aspirin EC 81 MG tablet Take 1 tablet (81 mg total) by mouth daily.   budesonide 3 MG 24 hr capsule Commonly known as: ENTOCORT EC TAKE 3 CAPSULES ONCE DAILY. What changed: See the new instructions.   buPROPion 150 MG 12 hr tablet Commonly known as: WELLBUTRIN SR Take 1 tablet (150 mg total) by mouth daily.   busPIRone 15 MG tablet Commonly known as: BUSPAR Take 1 tablet (15 mg total) by mouth 2 (two) times daily. Start taking on: July 15, 2020   denosumab 60 MG/ML Sosy injection Commonly known as: PROLIA Inject 60 mg into the skin every 6 (six) months.    folic acid 1 MG tablet Commonly known as: FOLVITE Take 1 tablet (1 mg total) by mouth daily. What changed: when to take this   gabapentin 300 MG capsule Commonly known as: NEURONTIN Take 300 mg by mouth 3 (three) times daily.   hyoscyamine 0.125 MG Tbdp disintergrating tablet Commonly known as: ANASPAZ Place 0.125 mg under the tongue every 6 (six) hours as needed ([name] cramps).   multivitamin with minerals Tabs tablet Take 1 tablet by mouth daily.   nicotine 21 mg/24hr patch Commonly known as: NICODERM CQ - dosed in mg/24 hours Place 1 patch (21 mg total) onto the skin daily.   nitroGLYCERIN 0.4 MG SL tablet Commonly known as: NITROSTAT Place 1 tablet (0.4 mg total) under the tongue every  5 (five) minutes as needed for chest pain.   ondansetron 4 MG disintegrating tablet Commonly known as: ZOFRAN-ODT Take 4 mg by mouth every 8 (eight) hours as needed for nausea or vomiting.   Oxycodone HCl 10 MG Tabs Take 10 mg by mouth 4 (four) times daily.   pantoprazole 40 MG tablet Commonly known as: PROTONIX TAKE (1) TABLET TWICE A DAY BEFORE MEALS. What changed: See the new instructions.   potassium chloride 10 MEQ CR capsule Commonly known as: MICRO-K Take 10 mEq by mouth 2 (two) times daily.   rosuvastatin 5 MG tablet Commonly known as: CRESTOR Take 1 tablet (5 mg total) by mouth every Monday, Wednesday, and Friday at 6 PM.   sertraline 100 MG tablet Commonly known as: ZOLOFT Take 2 tablets (200 mg total) by mouth at bedtime.   spironolactone 25 MG tablet Commonly known as: ALDACTONE Take 0.5 tablets (12.5 mg total) by mouth daily.   valACYclovir 500 MG tablet Commonly known as: VALTREX Take 500 mg by mouth 2 (two) times daily as needed (flare ups).   vitamin B-12 100 MCG tablet Commonly known as: CYANOCOBALAMIN Take 1 tablet (100 mcg total) by mouth daily. What changed: when to take this   Vitamin D 50 MCG (2000 UT) tablet Take 2,000 Units by mouth daily after  breakfast.            Durable Medical Equipment  (From admission, onward)         Start     Ordered   07/10/20 0957  For home use only DME oxygen  Once       Question Answer Comment  Length of Need Lifetime   Mode or (Route) Nasal cannula   Liters per Minute 2   Frequency Continuous (stationary and portable oxygen unit needed)   Oxygen delivery system Gas      07/10/20 0956          Follow-up Information    [facility] Follow up.   Why: oxygen Contact information: [address] [place] [address] [phone]        [facility] [place] [facility] Follow up.   Specialty: Home Health Services Why: [facility], HHPT, [facility] Contact information: [place] [address] [phone]        Biagio Borg, MD.   Specialties: Internal Medicine, Radiology Why: July 28,2021 @11 :20 AM Contact information: [place] [address] [phone]              Allergies  Allergen Reactions  . Atorvastatin Nausea And Vomiting  . Levofloxacin Other (See Comments)    Reaction: achilles tendon pain  . Omnicef [Cefdinir] Diarrhea    Uncontrollable diarrhea  . Trazodone And Nefazodone Other (See Comments)    "Felt like I was going to faint"  . Sulfa Antibiotics Rash  . Sulfonamide Derivatives Rash    Consultations: pccm   Procedures/Studies: CT Head Wo Contrast  Result Date: 07/04/2020 CLINICAL DATA:  Altered mental status EXAM: CT HEAD WITHOUT CONTRAST TECHNIQUE: Contiguous axial images were obtained from the base of the skull through the vertex without intravenous contrast. COMPARISON:  09/10/2019 FINDINGS: Brain: No evidence of acute infarction, hemorrhage, hydrocephalus, extra-axial collection or mass lesion/mass effect. Scattered low-density changes within the periventricular and subcortical white matter compatible with chronic microvascular ischemic change. Mild diffuse cerebral volume loss. Vascular:  Atherosclerotic calcifications involving the large vessels of the skull base. No unexpected hyperdense vessel. Skull: Normal.  Negative for fracture or focal lesion. Sinuses/Orbits: No acute finding. Other: None. IMPRESSION: 1. No acute intracranial findings. 2. Chronic microvascular ischemic change and cerebral volume loss. Electronically Signed   By: Davina Poke D.O.   On: 07/04/2020 16:16   CT ABDOMEN PELVIS W CONTRAST  Result Date: 06/11/2020 CLINICAL DATA:  Right upper quadrant abdominal pain, nausea and vomiting, diarrhea, symptoms for 3 days EXAM: CT ABDOMEN AND PELVIS WITH CONTRAST TECHNIQUE: Multidetector CT imaging of the abdomen and pelvis was performed using the standard protocol following bolus administration of intravenous contrast. CONTRAST:  167mL OMNIPAQUE IOHEXOL 300 MG/ML  SOLN COMPARISON:  12/23/2019 FINDINGS: Lower chest: No acute pleural or parenchymal lung disease. Hepatobiliary: No focal liver abnormality is seen. Status post cholecystectomy. No biliary dilatation. Pancreas: Unremarkable. No pancreatic ductal dilatation or surrounding inflammatory changes. Pancreatic divisum again noted. Spleen: Normal in size without focal abnormality. Adrenals/Urinary Tract: Mild bilateral renal cortical atrophy. Small renal cysts are again noted. The adrenals are unremarkable. No urinary tract calculi or obstructive uropathy. Bladder is decompressed limiting its evaluation. Stomach/Bowel: No bowel obstruction or ileus. The appendix is surgically absent. No bowel wall thickening or inflammatory change. Vascular/Lymphatic: Aortic atherosclerosis. No enlarged abdominal or pelvic lymph nodes. Reproductive: Uterus and bilateral adnexa are unremarkable. Other: No free fluid or free gas. No abdominal wall hernia. Musculoskeletal: Stable postsurgical changes of the lumbar spine and right femur. No acute or destructive bony lesions. Reconstructed images demonstrate no additional findings. IMPRESSION: 1. No  acute intra-abdominal or intrapelvic process. 2.  Emphysema (ICD10-J43.9). Electronically Signed   By: Randa Ngo M.D.   On: 06/11/2020 21:13   DG CHEST PORT 1 VIEW  Result Date: 07/08/2020 CLINICAL DATA:  CHF. EXAM: PORTABLE CHEST 1 VIEW COMPARISON:  Radiograph 07/06/2020. chest CT 12/23/2019 FINDINGS: Patient is rotated limiting assessment. Cardiomegaly is grossly stable. Mediastinal contours grossly stable. Improving interstitial opacities likely resolving pulmonary edema. Persistent ill-defined opacity in the right infrahilar region. Streaky left lung base opacities. No pneumothorax. Stable osseous structures. IMPRESSION: 1. Improving interstitial opacities likely resolving pulmonary edema. 2. Persistent ill-defined opacity in the right infrahilar region, may represent atelectasis or pneumonia. Streaky left lung base opacities, favor atelectasis. Electronically Signed   By: Keith Rake M.D.   On: 07/08/2020 17:54   DG Chest Port 1 View  Result Date: 07/06/2020 CLINICAL DATA:  Shortness of breath EXAM: PORTABLE CHEST 1 VIEW COMPARISON:  July 04, 2020 FINDINGS: There are fairly small pleural effusions bilaterally with bibasilar atelectatic change. Lungs elsewhere are clear. Heart is mildly enlarged with pulmonary vascularity normal. No adenopathy. There is aortic atherosclerosis. Bones are osteoporotic. IMPRESSION: Fairly small pleural effusions bilaterally with bibasilar atelectasis. Lungs elsewhere clear. Stable cardiac prominence. Aortic Atherosclerosis (ICD10-I70.0). Electronically Signed   By: Lowella Grip III M.D.   On: 07/06/2020 13:10   DG Chest Port 1 View  Result Date: 07/04/2020 CLINICAL DATA:  Unresponsive, altered mental status EXAM: PORTABLE CHEST 1 VIEW COMPARISON:  12/30/2019 FINDINGS: Rotated AP portable examination with mild cardiomegaly. Mild, diffuse interstitial pulmonary opacity. No focal airspace opacity. IMPRESSION: Rotated AP portable examination with mild  cardiomegaly and mild, diffuse interstitial pulmonary opacity, likely mild edema. No focal airspace opacity. Electronically Signed   By: Eddie Candle M.D.   On: 07/04/2020 15:42   ECHOCARDIOGRAM LIMITED  Result Date: 07/08/2020    ECHOCARDIOGRAM LIMITED REPORT   Patient Name:   AKSHARA BLUMENTHAL Date of Exam: 07/08/2020 Medical Rec #:  [id number]  Height:       62.0 in Accession #:    [id number]          Weight:       111.3 lb Date of Birth:  1951-05-13           BSA:          1.490 m Patient Age:    10 years            BP:           112/69 mmHg Patient Gender: F                   HR:           75 bpm. Exam Location:  Inpatient Procedure: Limited Echo, Cardiac Doppler and Color Doppler Indications:    CHF-Acute Systolic 254.27 / C62.37  History:        Patient has prior history of Echocardiogram examinations, most                 recent 02/01/2020. CHF, Signs/Symptoms:Chest Pain and Shortness of                 Breath; Risk Factors:Current Smoker. GERD.  Sonographer:    Vickie Epley RDCS Referring Phys: [id number] [place]  1. Left ventricular ejection fraction, by estimation, is 60 to 65%. The left ventricle has normal function. The left ventricle has no regional wall motion abnormalities. Left ventricular diastolic parameters are consistent with Grade I diastolic dysfunction (impaired relaxation).  2. Right ventricular systolic function is normal. The right ventricular size is normal.  3. The mitral valve is normal in structure. Trivial mitral valve regurgitation. No evidence of mitral stenosis.  4. The aortic valve is tricuspid. Aortic valve regurgitation is not visualized. No aortic stenosis is present.  5. The inferior vena cava is normal in size with greater than 50% respiratory variability, suggesting right atrial pressure of 3 mmHg. FINDINGS  Left Ventricle: Left ventricular ejection fraction, by estimation, is 60 to 65%. The left ventricle has normal function. The left ventricle has  no regional wall motion abnormalities. The left ventricular internal cavity size was normal in size. There is  no left ventricular hypertrophy. Right Ventricle: The right ventricular size is normal. Right ventricular systolic function is normal. Left Atrium: Left atrial size was normal in size. Right Atrium: Right atrial size was normal in size. Pericardium: There is no evidence of pericardial effusion. Mitral Valve: The mitral valve is normal in structure. Normal mobility of the mitral valve leaflets. Trivial mitral valve regurgitation. No evidence of mitral valve stenosis. Tricuspid Valve: The tricuspid valve is normal in structure. Tricuspid valve regurgitation is trivial. No evidence of tricuspid stenosis. Aortic Valve: The aortic valve is tricuspid. Aortic valve regurgitation is not visualized. No aortic stenosis is present. Pulmonic Valve: The pulmonic valve was normal in structure. Pulmonic valve regurgitation is not visualized. No evidence of pulmonic stenosis. Aorta: The aortic root is normal in size and structure. Venous: The inferior vena cava is normal in size with greater than 50% respiratory variability, suggesting right atrial pressure of 3 mmHg.  LEFT VENTRICLE PLAX 2D LVIDd:         3.90 cm     Diastology LVIDs:         2.70 cm     LV e' lateral:   9.20 cm/s LV PW:         0.90 cm     LV E/e' lateral:  7.2 LV IVS:        0.90 cm     LV e' medial:    5.43 cm/s LVOT diam:     2.00 cm     LV E/e' medial:  12.2 LV SV:         67 LV SV Index:   45 LVOT Area:     3.14 cm  LV Volumes (MOD) LV vol d, MOD A2C: 56.6 ml LV vol d, MOD A4C: 68.0 ml LV vol s, MOD A2C: 22.6 ml LV vol s, MOD A4C: 31.5 ml LV SV MOD A2C:     34.0 ml LV SV MOD A4C:     68.0 ml LV SV MOD BP:      35.4 ml LEFT ATRIUM         Index LA diam:    2.60 cm 1.74 cm/m  AORTIC VALVE LVOT Vmax:   110.00 cm/s LVOT Vmean:  75.600 cm/s LVOT VTI:    0.214 m  AORTA Ao Root diam: 2.90 cm MITRAL VALVE MV Area (PHT): 2.39 cm    SHUNTS MV Decel Time:  317 msec    Systemic VTI:  0.21 m MV E velocity: 66.40 cm/s  Systemic Diam: 2.00 cm MV A velocity: 88.70 cm/s MV E/A ratio:  0.75 Kirk Ruths MD Electronically signed by Kirk Ruths MD Signature Date/Time: 07/08/2020/12:40:03 PM    Final      Discharge Exam: Vitals:   07/10/20 0713 07/10/20 0846  BP: 119/89   Pulse: 77   Resp: 16   Temp: 98.4 F (36.9 C)   SpO2: 91% (!) 88%   Vitals:   07/10/20 0356 07/10/20 0451 07/10/20 0713 07/10/20 0846  BP: 115/77  119/89   Pulse: 65  77   Resp: 15  16   Temp: 97.6 F (36.4 C)  98.4 F (36.9 C)   TempSrc: Oral  Oral   SpO2: 90%  91% (!) 88%  Weight:  47.4 kg      General: Pt is alert, awake, not in acute distress Cardiovascular: RRR, S1/S2 +, no rubs, no gallops Respiratory: CTA bilaterally, no wheezing, no rhonchi, pont tenderness anterior mid and left chest. Abdominal: Soft, NT, ND, bowel sounds + Extremities: no edema, no cyanosis    The results of significant diagnostics from this hospitalization (including imaging, microbiology, ancillary and laboratory) are listed below for reference.     Microbiology: Recent Results (from the past 240 hour(s))  SARS Coronavirus 2 by RT PCR (hospital order, performed in [facility] hospital lab) Nasopharyngeal Nasopharyngeal Swab     Status: None   Collection Time: 07/04/20  3:11 PM   Specimen: Nasopharyngeal Swab  Result Value Ref Range Status   SARS Coronavirus 2 NEGATIVE NEGATIVE Final    Comment: (NOTE) SARS-CoV-2 target nucleic acids are NOT DETECTED.  The SARS-CoV-2 RNA is generally detectable in upper and lower respiratory specimens during the acute phase of infection. The lowest concentration of SARS-CoV-2 viral copies this assay can detect is 250 copies / mL. A negative result does not preclude SARS-CoV-2 infection and should not be used as the sole basis for treatment or other patient management decisions.  A negative result may occur with improper specimen collection /  handling, submission of specimen other than nasopharyngeal swab, presence of viral mutation(s) within the areas targeted by this assay, and inadequate number of viral copies (<250 copies / mL). A negative result must be combined with clinical observations, patient history, and epidemiological information.  Fact Sheet  for Patients:   StrictlyIdeas.no  Fact Sheet for Healthcare Providers: BankingDealers.co.za  This test is not yet approved or  cleared by the [place] FDA and has been authorized for detection and/or diagnosis of SARS-CoV-2 by FDA under an Emergency Use Authorization (EUA).  This EUA will remain in effect (meaning this test can be used) for the duration of the COVID-19 declaration under Section 564(b)(1) of the Act, 21 U.S.C. section 360bbb-3(b)(1), unless the authorization is terminated or revoked sooner.  Performed at [facility], [place] [address], [place], [place] [address]   Culture, blood (routine x 2)     Status: None   Collection Time: 07/04/20  7:53 PM   Specimen: BLOOD  Result Value Ref Range Status   Specimen Description BLOOD RIGHT ANTECUBITAL  Final   Special Requests   Final    BOTTLES DRAWN AEROBIC ONLY Blood Culture adequate volume   Culture   Final    NO GROWTH 5 DAYS Performed at [facility], [place] [address] [place], [place] [address]    Report Status 07/09/2020 FINAL  Final  MRSA PCR Screening     Status: None   Collection Time: 07/04/20  7:54 PM   Specimen: Nasal Mucosa; Nasopharyngeal  Result Value Ref Range Status   MRSA by PCR NEGATIVE NEGATIVE Final    Comment:        The GeneXpert MRSA Assay (FDA approved for NASAL specimens only), is one component of a comprehensive MRSA colonization surveillance program. It is not intended to diagnose MRSA infection nor to guide or monitor treatment for MRSA infections. Performed at [facility], [place] [address],  [place], [place] [address]   Culture, blood (routine x 2)     Status: None   Collection Time: 07/04/20  7:56 PM   Specimen: BLOOD LEFT HAND  Result Value Ref Range Status   Specimen Description BLOOD LEFT HAND  Final   Special Requests   Final    BOTTLES DRAWN AEROBIC ONLY Blood Culture adequate volume   Culture   Final    NO GROWTH 5 DAYS Performed at [facility], [place] [address], [place],  [address]    Report Status 07/09/2020 FINAL  Final     Labs: BNP (last 3 results) Recent Labs    07/08/20 0232 07/09/20 0247 07/10/20 0252  BNP 230.8* 97.5 36.6   Basic Metabolic Panel: Recent Labs  Lab 07/04/20 1521 07/04/20 1953 07/05/20 0327 07/06/20 0024 07/06/20 1124 07/06/20 1124 07/06/20 2127 07/07/20 0334 07/07/20 0434 07/08/20 0232 07/09/20 0955  NA   < >  --  138   < > 138   < > 138 138 136 134* 134*  K   < >  --  3.5   < > 3.2*   < > 2.9* 2.9* 3.0* 4.0 3.8  CL   < >  --  98  --  87*  --  85* 86*  --  91* 92*  CO2   < >  --  28  --  39*  --  38* 37*  --  31 28  GLUCOSE   < >  --  104*  --  129*  --  160* 138*  --  138* 87  BUN   < >  --  19  --  20  --  19 22  --  28* 28*  CREATININE   < >  --  1.60*  --  0.99  --  0.89 0.93  --  1.00 0.97  CALCIUM   < >  --  7.6*  --  8.1*  --  8.5* 8.5*  --  9.1 9.7  MG  --  1.8 1.8  --   --   --   --  2.0  --   --  1.9  PHOS  --  5.7* 5.6*  --   --   --   --  1.8*  --  2.4* 6.6*   < > = values in this interval not displayed.   Liver Function Tests: Recent Labs  Lab 07/04/20 1501 07/06/20 1124 07/07/20 0334  AST 76* 169* 196*  ALT 24 136* 164*  ALKPHOS 63 56 58  BILITOT 1.4* 0.7 0.9  PROT 6.6 6.5 7.0  ALBUMIN 3.7 3.3* 3.4*   No results for input(s): LIPASE, AMYLASE in the last 168 hours. No results for input(s): AMMONIA in the last 168 hours. CBC: Recent Labs  Lab 07/04/20 1501 07/04/20 1518 07/06/20 0301 07/06/20 0301 07/07/20 0334 07/07/20 0434 07/08/20 1300 07/09/20 0802 07/10/20 0930  WBC 24.2*    < > 15.2*  --  14.8*  --  17.2* 14.1* 13.6*  NEUTROABS 20.3*  --   --   --   --   --  16.1* 10.6* 8.4*  HGB 14.5   < > 12.4   < > 13.3 13.9 13.2 12.4 14.0  HCT 46.8*   < > 39.4   < > 40.5 41.0 40.7 37.9 41.2  MCV 101.3*   < > 96.8  --  93.3  --  94.7 93.6 92.4  PLT 267   < > 223  --  270  --  297 286 319   < > = values in this interval not displayed.   Cardiac Enzymes: No results for input(s): CKTOTAL, CKMB, CKMBINDEX, TROPONINI in the last 168 hours. BNP: Invalid input(s): POCBNP CBG: Recent Labs  Lab 07/04/20 1936 07/05/20 1945  GLUCAP 133* 130*   D-Dimer No results for input(s): DDIMER in the last 72 hours. Hgb A1c No results for input(s): HGBA1C in the last 72 hours. Lipid Profile No results for input(s): CHOL, HDL, LDLCALC, TRIG, CHOLHDL, LDLDIRECT in the last 72 hours. Thyroid function studies No results for input(s): TSH, T4TOTAL, T3FREE, THYROIDAB in the last 72 hours.  Invalid input(s): FREET3 Anemia work up No results for input(s): VITAMINB12, FOLATE, FERRITIN, TIBC, IRON, RETICCTPCT in the last 72 hours. Urinalysis    Component Value Date/Time   COLORURINE AMBER (A) 07/04/2020 1602   APPEARANCEUR HAZY (A) 07/04/2020 1602   LABSPEC 1.017 07/04/2020 1602   PHURINE 5.0 07/04/2020 1602   GLUCOSEU NEGATIVE 07/04/2020 1602   GLUCOSEU NEGATIVE 12/22/2017 1650   HGBUR NEGATIVE 07/04/2020 1602   BILIRUBINUR NEGATIVE 07/04/2020 1602   BILIRUBINUR neg 02/07/2016 1627   KETONESUR NEGATIVE 07/04/2020 1602   PROTEINUR 100 (A) 07/04/2020 1602   UROBILINOGEN 0.2 12/22/2017 1650   NITRITE NEGATIVE 07/04/2020 1602   LEUKOCYTESUR NEGATIVE 07/04/2020 1602   Sepsis Labs Invalid input(s): PROCALCITONIN,  WBC,  LACTICIDVEN Microbiology Recent Results (from the past 240 hour(s))  SARS Coronavirus 2 by RT PCR (hospital order, performed in [place] hospital lab) Nasopharyngeal Nasopharyngeal Swab     Status: None   Collection Time: 07/04/20  3:11 PM   Specimen:  Nasopharyngeal Swab  Result Value Ref Range Status   SARS Coronavirus 2 NEGATIVE NEGATIVE Final    Comment: (NOTE) SARS-CoV-2 target nucleic acids are NOT DETECTED.  The SARS-CoV-2  RNA is generally detectable in upper and lower respiratory specimens during the acute phase of infection. The lowest concentration of SARS-CoV-2 viral copies this assay can detect is 250 copies / mL. A negative result does not preclude SARS-CoV-2 infection and should not be used as the sole basis for treatment or other patient management decisions.  A negative result may occur with improper specimen collection / handling, submission of specimen other than nasopharyngeal swab, presence of viral mutation(s) within the areas targeted by this assay, and inadequate number of viral copies (<250 copies / mL). A negative result must be combined with clinical observations, patient history, and epidemiological information.  Fact Sheet for Patients:   StrictlyIdeas.no  Fact Sheet for Healthcare Providers: BankingDealers.co.za  This test is not yet approved or  cleared by the [place] FDA and has been authorized for detection and/or diagnosis of SARS-CoV-2 by FDA under an Emergency Use Authorization (EUA).  This EUA will remain in effect (meaning this test can be used) for the duration of the COVID-19 declaration under Section 564(b)(1) of the Act, 21 U.S.C. section 360bbb-3(b)(1), unless the authorization is terminated or revoked sooner.  Performed at [facility], [place] [address] [place], [place] [address]   Culture, blood (routine x 2)     Status: None   Collection Time: 07/04/20  7:53 PM   Specimen: BLOOD  Result Value Ref Range Status   Specimen Description BLOOD RIGHT ANTECUBITAL  Final   Special Requests   Final    BOTTLES DRAWN AEROBIC ONLY Blood Culture adequate volume   Culture   Final    NO GROWTH 5 DAYS Performed at [facility], [place] [address], [place], [place] [address]    Report Status 07/09/2020 FINAL  Final  MRSA PCR Screening     Status: None   Collection Time: 07/04/20  7:54 PM   Specimen: Nasal Mucosa; Nasopharyngeal  Result Value Ref Range Status   MRSA by PCR NEGATIVE NEGATIVE Final    Comment:        The GeneXpert MRSA Assay (FDA approved for NASAL specimens only), is one component of a comprehensive MRSA colonization surveillance program. It is not intended to diagnose MRSA infection nor to guide or monitor treatment for MRSA infections. Performed at [facility], [place] [address], [place], [place] [address]   Culture, blood (routine x 2)     Status: None   Collection Time: 07/04/20  7:56 PM   Specimen: BLOOD LEFT HAND  Result Value Ref Range Status   Specimen Description BLOOD LEFT HAND  Final   Special Requests   Final    BOTTLES DRAWN AEROBIC ONLY Blood Culture adequate volume   Culture   Final    NO GROWTH 5 DAYS Performed at [facility], [place] [address] [place], [place] [address]    Report Status 07/09/2020 FINAL  Final     Time coordinating discharge: Over 30 minutes  SIGNED:   Darliss Cheney, MD  [facility] 07/10/2020, 10:36 AM  If 7PM-7AM, please contact night-coverage [email or web address]

## 2020-07-10 NOTE — Discharge Instructions (Signed)
Heart Failure, Self Care Heart failure is a serious condition. This sheet explains things you need to do to take care of yourself at home. To help you stay as healthy as possible, you may be asked to change your diet, take certain medicines, and make other changes in your life. Your doctor may also give you more specific instructions. If you have problems or questions, call your doctor. What are the risks? Having heart failure makes it more likely for you to have some problems. These problems can get worse if you do not take good care of yourself. Problems may include:  Blood clotting problems. This may cause a stroke.  Damage to the kidneys, liver, or lungs.  Abnormal heart rhythms. Supplies needed:  Scale for weighing yourself.  Blood pressure monitor.  Notebook.  Medicines. How to care for yourself when you have heart failure Medicines Take over-the-counter and prescription medicines only as told by your doctor. Take your medicines every day.  Do not stop taking your medicine unless your doctor tells you to do so.  Do not skip any medicines.  Get your prescriptions refilled before you run out of medicine. This is important. Eating and drinking   Eat heart-healthy foods. Talk with a diet specialist (dietitian) to create an eating plan.  Choose foods that: ? Have no trans fat. ? Are low in saturated fat and cholesterol.  Choose healthy foods, such as: ? Fresh or frozen fruits and vegetables. ? Fish. ? Low-fat (lean) meats. ? Legumes, such as beans, peas, and lentils. ? Fat-free or low-fat dairy products. ? Whole-grain foods. ? High-fiber foods.  Limit salt (sodium) if told by your doctor. Ask your diet specialist to tell you which seasonings are healthy for your heart.  Cook in healthy ways instead of frying. Healthy ways of cooking include roasting, grilling, broiling, baking, poaching, steaming, and stir-frying.  Limit how much fluid you drink, if told by your  doctor. Alcohol use  Do not drink alcohol if: ? Your doctor tells you not to drink. ? Your heart was damaged by alcohol, or you have very bad heart failure. ? You are pregnant, may be pregnant, or are planning to become pregnant.  If you drink alcohol: ? Limit how much you use to:  0-1 drink a day for women.  0-2 drinks a day for men. ? Be aware of how much alcohol is in your drink. In the U.S., one drink equals one 12 oz bottle of beer (355 mL), one 5 oz glass of wine (148 mL), or one 1 oz glass of hard liquor (44 mL). Lifestyle   Do not use any products that contain nicotine or tobacco, such as cigarettes, e-cigarettes, and chewing tobacco. If you need help quitting, ask your doctor. ? Do not use nicotine gum or patches before talking to your doctor.  Do not use illegal drugs.  Lose weight if told by your doctor.  Do physical activity if told by your doctor. Talk to your doctor before you begin an exercise if: ? You are an older adult. ? You have very bad heart failure.  Learn to manage stress. If you need help, ask your doctor.  Get rehab (rehabilitation) to help you stay independent and to help with your quality of life.  Plan time to rest when you get tired. Check weight and blood pressure   Weigh yourself every day. This will help you to know if fluid is building up in your body. ? Weigh yourself every morning   after you pee (urinate) and before you eat breakfast. ? Wear the same amount of clothing each time. ? Write down your daily weight. Give your record to your doctor.  Check and write down your blood pressure as told by your doctor.  Check your pulse as told by your doctor. Dealing with very hot and very cold weather  If it is very hot: ? Avoid activities that take a lot of energy. ? Use air conditioning or fans, or find a cooler place. ? Avoid caffeine and alcohol. ? Wear clothing that is loose-fitting, lightweight, and light-colored.  If it is very  cold: ? Avoid activities that take a lot of energy. ? Layer your clothes. ? Wear mittens or gloves, a hat, and a scarf when you go outside. ? Avoid alcohol. Follow these instructions at home:  Stay up to date with shots (vaccines). Get pneumococcal and flu (influenza) shots.  Keep all follow-up visits as told by your doctor. This is important. Contact a doctor if:  You gain weight quickly.  You have increasing shortness of breath.  You cannot do your normal activities.  You get tired easily.  You cough a lot.  You don't feel like eating or feel like you may vomit (nauseous).  You become puffy (swell) in your hands, feet, ankles, or belly (abdomen).  You cannot sleep well because it is hard to breathe.  You feel like your heart is beating fast (palpitations).  You get dizzy when you stand up. Get help right away if:  You have trouble breathing.  You or someone else notices a change in your behavior, such as having trouble staying awake.  You have chest pain or discomfort.  You pass out (faint). These symptoms may be an emergency. Do not wait to see if the symptoms will go away. Get medical help right away. Call your local emergency services (911 in the U.S.). Do not drive yourself to the hospital. Summary  Heart failure is a serious condition. To care for yourself, you may have to change your diet, take medicines, and make other lifestyle changes.  Take your medicines every day. Do not stop taking them unless your doctor tells you to do so.  Eat heart-healthy foods, such as fresh or frozen fruits and vegetables, fish, lean meats, legumes, fat-free or low-fat dairy products, and whole-grain or high-fiber foods.  Ask your doctor if you can drink alcohol. You may have to stop alcohol use if you have very bad heart failure.  Contact your doctor if you gain weight quickly or feel that your heart is beating too fast. Get help right away if you pass out, or have chest pain  or trouble breathing. This information is not intended to replace advice given to you by your health care provider. Make sure you discuss any questions you have with your health care provider. Document Revised: 03/28/2019 Document Reviewed: 03/29/2019 Elsevier Patient Education  2020 Elsevier Inc.  

## 2020-07-10 NOTE — Progress Notes (Signed)
Discharged home accompanied by husband and daughter. Belongings taken home.

## 2020-07-10 NOTE — Progress Notes (Signed)
SATURATION QUALIFICATIONS: (This note is used to comply with regulatory documentation for home oxygen)  Patient Saturations on Room Air at Rest = 88%  Patient Saturations on 2L at rest= 92%  Patient Saturations on 3 Liters of oxygen while Ambulating = 92%  Please briefly explain why patient needs home oxygen:Pt unable to maintain even resting saturation on room air and requires 2L at rest and 3L with mobility to maintain saturation >89% Denise King P, PT Acute Rehabilitation Services Pager: [phone] Office: [phone]

## 2020-07-10 NOTE — Progress Notes (Signed)
Physical Therapy Treatment Patient Details Name: Denise King MRN: [id number] DOB: 1951-07-25 Today's Date: 07/10/2020    History of Present Illness Pt adm with acute on chronic hypercarbic respiratory failure suspicious mostly for narcotic over-use in patient with undiagnosed COPD and volume overload. Pt treated with bipap. PMH - heart failure, Crohn's disease, depression, chronic pain, rt femur fx, etoh dependence    PT Comments    Pt supine on arrival reporting significant chest soreness from compressions and dry cough. Pt with slowly improving mobility able to ambulate limited distance into hall and perform pivot for toileting. Pt denied further hallway ambulation or attempt of stairs due to pain and fatigue. Pt states family can assist entry into home and car can be parked near stairs for entry. Pt also reports spouse with own health concerns and that she has been assisting him as he isn't supposed to driving but has been driving self to the hospital.   89% on RA at rest, 92% on 2L at rest 86% on 2L with gait, 92% on 3L with gait    Follow Up Recommendations  Home health PT;Supervision/Assistance - 24 hour     Equipment Recommendations  None recommended by PT    Recommendations for Other Services       Precautions / Restrictions Precautions Precautions: Fall Precaution Comments: watch sats    Mobility  Bed Mobility Overal bed mobility: Needs Assistance Bed Mobility: Supine to Sit     Supine to sit: Min assist;HOB elevated     General bed mobility comments: Assist to elevate trunk into sitting. HOB 30 degrees, increased time limited by pt with chest pain with movement from compressions  Transfers Overall transfer level: Needs assistance   Transfers: Sit to/from Stand Sit to Stand: Min guard Stand pivot transfers: Min guard       General transfer comment: cues for hand placement and safety with pt able to stand from bed and  BSC  Ambulation/Gait Ambulation/Gait assistance: Min guard Gait Distance (Feet): 74 Feet Assistive device: Rolling walker (2 wheeled) Gait Pattern/deviations: Step-through pattern;Decreased stride length;Trunk flexed   Gait velocity interpretation: 1.31 - 2.62 ft/sec, indicative of limited community ambulator General Gait Details: cues for posture and safety with gait limited by fatigue. pt required 3L during gait to maintain 92%   Stairs             Wheelchair Mobility    Modified Rankin (Stroke Patients Only)       Balance Overall balance assessment: Needs assistance Sitting-balance support: No upper extremity supported;Feet supported Sitting balance-Leahy Scale: Good     Standing balance support: Bilateral upper extremity supported;No upper extremity supported Standing balance-Leahy Scale: Fair Standing balance comment: pt able to statically stand at sink without support with use of RW for gait                            Cognition Arousal/Alertness: Awake/alert Behavior During Therapy: Flat affect Overall Cognitive Status: Within Functional Limits for tasks assessed                                        Exercises General Exercises - Lower Extremity Long Arc Quad: AROM;Both;Seated;15 reps Hip Flexion/Marching: AROM;Both;Seated;15 reps    General Comments        Pertinent Vitals/Pain Faces Pain Scale: Hurts whole lot Pain Location: chest Pain Descriptors /  Indicators: Aching;Guarding Pain Intervention(s): Limited activity within patient's tolerance;Monitored during session;Repositioned    Home Living                      Prior Function            PT Goals (current goals can now be found in the care plan section) Progress towards PT goals: Progressing toward goals    Frequency    Min 3X/week      PT Plan Current plan remains appropriate    Co-evaluation              AM-PAC PT "6 Clicks"  Mobility   Outcome Measure  Help needed turning from your back to your side while in a flat bed without using bedrails?: None Help needed moving from lying on your back to sitting on the side of a flat bed without using bedrails?: A Little Help needed moving to and from a bed to a chair (including a wheelchair)?: A Little Help needed standing up from a chair using your arms (e.g., wheelchair or bedside chair)?: A Little Help needed to walk in hospital room?: A Little Help needed climbing 3-5 steps with a railing? : A Little 6 Click Score: 19    End of Session Equipment Utilized During Treatment: Oxygen Activity Tolerance: Patient limited by fatigue Patient left: in chair;with call bell/phone within reach Nurse Communication: Mobility status PT Visit Diagnosis: Unsteadiness on feet (R26.81);Other abnormalities of gait and mobility (R26.89);Muscle weakness (generalized) (M62.81)     Time: 7185-5015 PT Time Calculation (min) (ACUTE ONLY): 23 min  Charges:  $Gait Training: 8-22 mins $Therapeutic Exercise: 8-22 mins                     [place], PT Acute Rehabilitation Services Pager: [phone] Office: [place] 07/10/2020, 1:26 PM

## 2020-07-10 NOTE — Care Management Important Message (Signed)
Important Message  Patient Details  Name: Denise King MRN: [id number] Date of Birth: [birth year]   Medicare Important Message Given:  Yes     Tyson Parkison [address] 07/10/2020, 1:53 PM

## 2020-07-10 NOTE — TOC Initial Note (Signed)
Transition of Care [facility]) - Initial/Assessment Note    Patient Details  Name: Denise King MRN: [id number] Date of Birth: Jun 28, 1951  [facility]) CM/SW Contact:    Zenon Mayo, RN Phone Number: 07/10/2020, 9:53 AM  Clinical Narrative:                 NCM spoke with patient at bedside, offered choice, she is ok with [facility], referral made to [place] with [facility] for [facility] [place], [place].  [place] is able to take referral with soc beginning 24 to 48 hrs post dc.  Patient states her spouse will be transporting her home today.  She states she has no issues getting her medications. She has home oxygen with [employer] already on 1.5 liters, but will need new ambulatory sat and new order.  NCM informed Zach with [employer] that she will need this. Patient states she can get her spouse to bring a tank to the hospital for her to go home with it takes her 15 to 20 minutes to get home.   Expected Discharge Plan: [place] Barriers to Discharge: No Barriers Identified   Patient Goals and CMS Choice Patient states their goals for this hospitalization and ongoing recovery are:: get better CMS Medicare.gov Compare Post Acute Care list provided to:: Patient Choice offered to / list presented to : Patient  Expected Discharge Plan and Services Expected Discharge Plan: [place] In-house Referral: NA Discharge Planning Services: CM Consult Post Acute Care Choice: [place] arrangements for the past 2 months: Single Family Home                 DME Arranged: Oxygen DME Agency: [employer] Date DME Agency Contacted: 07/10/20 Time DME Agency Contacted: [phone] Representative spoke with at DME Agency: Thedore Mins HH Arranged: RN, PT, Nurse's Aide, Disease Management [place] Agency: [facility] Date [place]: 07/10/20 Time [place]: [phone] Representative spoke with at [place]: [place]  Arrangements/Services Living arrangements for the past 2 months: [place] with:: Spouse Patient language and need for interpreter reviewed:: Yes Do you feel safe going back to the place where you live?: Yes      Need for Family Participation in Patient Care: Yes (Comment) Care giver support system in place?: Yes (comment) Current home services: DME (has a rollator and walker , and oxygen) Criminal Activity/Legal Involvement Pertinent to Current Situation/Hospitalization: No - Comment as needed  Activities of Daily Living      Permission Sought/Granted                  Emotional Assessment Appearance:: Appears stated age Attitude/Demeanor/Rapport: Engaged Affect (typically observed): Appropriate Orientation: : Oriented to Situation, Oriented to  Time, Oriented to Place, Oriented to Self Alcohol / Substance Use: Not Applicable Psych Involvement: No (comment)  Admission diagnosis:  Elevated troponin [R77.8] Acute on chronic heart failure (HCC) [I50.9] Aspiration pneumonia, unspecified aspiration pneumonia type, unspecified laterality, unspecified part of lung ([place]) [J69.0] Patient Active Problem List   Diagnosis Date Noted  . Acute on chronic respiratory failure with hypercapnia ([place])   . Acute on chronic heart failure ([place]) 07/04/2020  . Choledocholithiasis 02/22/2020  . Lymphocytic colitis 02/16/2020  . Duodenal ulcer hemorrhagic   . Malnutrition of moderate degree 01/01/2020  . Acute respiratory failure with hypoxia and hypercapnia ([place]) 12/28/2019  . Bipolar disorder ([place]) 12/28/2019  . Alcohol abuse 12/28/2019  . Acute on chronic  diastolic CHF (congestive heart failure) ([place]) 12/28/2019  . Abnormal MRI of abdomen   . Cholelithiasis   . Common bile duct stone 12/23/2019  . Acute cholecystitis 12/23/2019  . Polycythemia 09/11/2019  . Right knee pain 08/09/2019  . Quadriceps contusion 08/09/2019  . Abnormal cardiac CT angiography 05/11/2019  . Chest  pain 04/11/2019  . Acute viral syndrome 04/11/2019  . Tobacco abuse 04/11/2019  . Hyperphosphatemia 04/11/2019  . SOB (shortness of breath) 04/07/2019  . Hip fracture ([place]) 10/01/2018  . Dysuria 05/20/2018  . Right leg swelling 05/20/2018  . CAP (community acquired pneumonia) 05/13/2018  . Tachycardia 05/13/2018  . Chest wall pain 05/13/2018  . Narcotic poisoning ([place]) 05/13/2018  . EtOH dependence ([place])   . Polypharmacy   . Esophagitis   . Preventative health care 12/22/2017  . Erythrocytosis 12/22/2017  . Depression 12/22/2017  . Hyperglycemia 12/22/2017  . Right sided sciatica 12/22/2017  . Cough 06/10/2017  . Family history of colon cancer - brother and father 03/10/2016  . Insomnia 02/11/2016  . Generalized anxiety disorder 11/25/2015  . Urinary frequency 04/25/2015  . Nausea and vomiting in adult 11/08/2014  . Cigarette smoker 08/15/2013  . Otitis media 10/27/2012  . Abdominal pain 03/30/2012  . HEPATIC CYST 02/18/2010  . Chronic pain syndrome 02/08/2010  . Vitamin B12 deficiency 10/04/2009  . GERD 01/04/2009  . ABDOMINAL PAIN-EPIGASTRIC 01/04/2009  . DYSTHYMIA 08/10/2008  . Venous (peripheral) insufficiency 08/10/2008  . Irritable bowel syndrome 08/10/2008  . Back pain 08/10/2008  . CIGARETTE SMOKER 03/06/2008  . Hx of adenomatous polyp of colon 12/06/2007  . BRONCHITIS, RECURRENT 12/06/2007  . DIVERTICULOSIS OF COLON 12/06/2007  . OSTEOPENIA 12/06/2007  . CALCULUS, KIDNEY 10/07/2007  . VERTIGO 10/07/2007  . HCWCBJSE(831.5) 10/07/2007   PCP:  Biagio Borg, MD Pharmacy:   [facility] [place] [address] Phone: [phone] Fax: [phone]     Social Determinants of Health (SDOH) Interventions Social Connections Interventions: Intervention Not Indicated Transportation Interventions: Intervention Not Indicated  Readmission Risk Interventions Readmission Risk  Prevention Plan 07/10/2020  Transportation Screening Complete  PCP or Specialist Appt within 3-5 Days Complete  HRI or [place] Complete  Social Work Consult for [place] Planning/Counseling Complete  Palliative Care Screening Not Applicable  Some recent data might be hidden

## 2020-07-11 ENCOUNTER — Telehealth: Payer: Self-pay | Admitting: *Deleted

## 2020-07-11 NOTE — Telephone Encounter (Signed)
Transition Care Management Follow-up Telephone Call   Date discharged? 07/10/20   How have you been since you were released from the hospital? Pt states she is ok, but still weak/fatigue   Do you understand why you were in the hospital?YES   Do you understand the discharge instructions? YES   Where were you discharged to? Home   Items Reviewed:  Medications reviewed: YES, she states her medications are still the same  Allergies reviewed: YES  Dietary changes reviewed: YES, limited salt intake  Referrals reviewed: YES, will need to discuss getting referral to have PFT   Functional Questionnaire:   Activities of Daily Living (ADLs):   She states she are independent in the following: ambulation, bathing and hygiene, feeding, continence, grooming, toileting and dressing States she doesn't require assistance   Any transportation issues/concerns?: NO   Any patient concerns? NO   Confirmed importance and date/time of follow-up visits scheduled YES, appt 07/24/20  Provider Appointment booked with Dr. Jenny Reichmann  Confirmed with patient if condition begins to worsen call PCP or go to the ER.  Patient was given the office number and encouraged to call back with question or concerns.  : YES

## 2020-07-12 DIAGNOSIS — J9612 Chronic respiratory failure with hypercapnia: Secondary | ICD-10-CM | POA: Diagnosis not present

## 2020-07-12 DIAGNOSIS — M1990 Unspecified osteoarthritis, unspecified site: Secondary | ICD-10-CM | POA: Diagnosis not present

## 2020-07-12 DIAGNOSIS — I110 Hypertensive heart disease with heart failure: Secondary | ICD-10-CM | POA: Diagnosis not present

## 2020-07-12 DIAGNOSIS — G894 Chronic pain syndrome: Secondary | ICD-10-CM | POA: Diagnosis not present

## 2020-07-12 DIAGNOSIS — J42 Unspecified chronic bronchitis: Secondary | ICD-10-CM | POA: Diagnosis not present

## 2020-07-12 DIAGNOSIS — I5032 Chronic diastolic (congestive) heart failure: Secondary | ICD-10-CM | POA: Diagnosis not present

## 2020-07-12 DIAGNOSIS — M810 Age-related osteoporosis without current pathological fracture: Secondary | ICD-10-CM | POA: Diagnosis not present

## 2020-07-12 DIAGNOSIS — S2249XD Multiple fractures of ribs, unspecified side, subsequent encounter for fracture with routine healing: Secondary | ICD-10-CM | POA: Diagnosis not present

## 2020-07-12 DIAGNOSIS — J9611 Chronic respiratory failure with hypoxia: Secondary | ICD-10-CM | POA: Diagnosis not present

## 2020-07-12 DIAGNOSIS — M5431 Sciatica, right side: Secondary | ICD-10-CM | POA: Diagnosis not present

## 2020-07-12 NOTE — Progress Notes (Signed)
History of Present Illness:    Denise King is a 69 y.o. female first seen 04/07/19  with hospitalization for acute Viral syndrome and URI symptoms and neg COVID lab test.  Resp panel neg.    She had no history of cardiac disease admitted with 48 hours dry cough and dyspnea then had nausea and vomiting Has had weeks of diarrhea ? Due to [name]. After vomiting had mild central chest pressure  She is a ppd smoker active over 40 years.  Troponin 1.07 WBC mildly elevated 10.7  Troponin remained flat and echo without RWMA.  She was on IV heparin for 48 hours and neg PE on CTA of chest but + CAD.  In mLAD.  Lipids were normal in hospital at 71    Cardiac CTA done 05/09/19 calcium score 1304 99 th percentile with positive FFR CT in RCA/LAD Cath 05/11/19 with only 50% mid RCA and 50% prox/mid LAD Medical Rx recommended   Had PT and got hematoma right thigh and knee Has held aspirin  No motivation to quit smoking   Hospitalized 12/26-1/11/21 had PNA with gallstones  Had ERCP 12/25/19 post procedure had hypercarbic hypoxemia and required bipap. Plans for outpatient laparoscopic GB surgery Then had black stool EGD done 01/06/20 ulcer at ampulla where sphincterotomy was done This was injected She continues to smoke   CXR showed bilateral effusion with subsegmental atelectasis BNP 893 She was d/c with lasix 20 mg daily  Previous echo 04/11/19 showed EF 60-65% with no significant valve disease    TTE 02/01/20 EF 60-65% mild LVH elevated EDP mild MR   Had definitive laparoscopic cholecystectomy on 02/22/20  Lots of stress at home Husband had a spinal abscess and complications with surgery in February and is "an invalid" now    Hospitalized 07/04/20-07/10/20 with AMS, emesis respiratory failure with CO2 78.4 pH 7.19 and Rx with diuresis and bipap CXR ? CHF and right infrahilar opacity ? Pneumonia/atelectasis CT head negative BNP 2174 but following day only 230 and then only 97.5 CBC elevated at 17.2 She has  COPD and is a current and long time smoker ? Narcotics making hypercapnea worse D/c with prednisone and oxygen which she has used at home before She was instructed not to take MS Contin   Still sore from chest compressions she got on admission Needs to f/u with psychiatry and primary to simplify CNS/pain meds and likely be referred to pulmonary as she is still on oxygen  The patient does not have symptoms concerning for COVID-19 infection (fever, chills, cough, or new shortness of breath).    Past Medical History:  Diagnosis Date  . Acute cystitis   . Acute respiratory failure ([place])   . Allergy    as a child grew out of them  . Anemia    pernicious anemia  . Anxiety   . Arthritis   . Back pain   . Blood transfusion   . CAP (community acquired pneumonia)   . CHF (congestive heart failure) ([place])   . Chronic pain syndrome   . Common bile duct stone   . Depression   . Diverticulosis of colon   . Duodenal ulcer hemorrhagic-after biliary sphincterotomy   . Dysthymia   . Esophagitis   . EtOH dependence ([place])   . Gastritis   . GERD (gastroesophageal reflux disease)   . H/O chest pain Dec. 2013   no work up done  . Hx of colonic polyps   . Hypertension   .  Irritable bowel syndrome   . Lymphocytic colitis 02/16/2020  . Osteopenia   . Osteoporosis   . Pneumonia 2019  . Polypharmacy   . Reflux esophagitis   . Right sided sciatica 12/22/2017  . Tobacco use disorder   . Unspecified chronic bronchitis ([place])   . Vertigo   . Vitamin B12 deficiency   . Wears glasses    Past Surgical History:  Procedure Laterality Date  . APPENDECTOMY    . BACK SURGERY  10-09   Dr. Patrice Paradise  . BIOPSY  02/14/2020   Procedure: BIOPSY;  Surgeon: Gatha Mayer, MD;  Location: [facility];  Service: Endoscopy;;  . CARPAL TUNNEL RELEASE Right 08/14/2014   Procedure: RIGHT CARPAL TUNNEL RELEASE AND INJECT LEFT THUMB;  Surgeon: Daryll Brod, MD;  Location: [facility];  Service: Orthopedics;   Laterality: Right;  . CHOLECYSTECTOMY N/A 02/22/2020   Procedure: LAPAROSCOPIC CHOLECYSTECTOMY WITH INTRAOPERATIVE CHOLANGIOGRAM;  Surgeon: Kieth Brightly Arta Bruce, MD;  Location: [facility];  Service: General;  Laterality: N/A;  . COLONOSCOPY    . COLONOSCOPY WITH PROPOFOL N/A 02/14/2020   Procedure: COLONOSCOPY WITH PROPOFOL;  Surgeon: Gatha Mayer, MD;  Location: [facility];  Service: Endoscopy;  Laterality: N/A;  . ENDOSCOPIC RETROGRADE CHOLANGIOPANCREATOGRAPHY (ERCP) WITH PROPOFOL N/A 12/25/2019   Procedure: ENDOSCOPIC RETROGRADE CHOLANGIOPANCREATOGRAPHY (ERCP) WITH PROPOFOL;  Surgeon: Gatha Mayer, MD;  Location: [place] [facility];  Service: Gastroenterology;  Laterality: N/A;  . ESOPHAGOGASTRODUODENOSCOPY (EGD) WITH PROPOFOL N/A 01/06/2020   Procedure: ESOPHAGOGASTRODUODENOSCOPY (EGD) WITH PROPOFOL;  Surgeon: Irene Shipper, MD;  Location: [facility] ENDOSCOPY;  Service: Endoscopy;  Laterality: N/A;  . HOT HEMOSTASIS N/A 01/06/2020   Procedure: HOT HEMOSTASIS (ARGON PLASMA COAGULATION/BICAP);  Surgeon: Irene Shipper, MD;  Location: [facility] ENDOSCOPY;  Service: Endoscopy;  Laterality: N/A;  . INTRAMEDULLARY (IM) NAIL INTERTROCHANTERIC Right 10/01/2018   Procedure: INTRAMEDULLARY (IM) NAIL INTERTROCHANTRIC;  Surgeon: Thornton Park, MD;  Location: [facility];  Service: Orthopedics;  Laterality: Right;  . LAPAROSCOPY N/A 02/21/2013   Procedure: LAPAROSCOPY OPERATIVE;  Surgeon: Margarette Asal, MD;  Location: [facility];  Service: Gynecology;  Laterality: N/A;  REQUESTING 5MM SCOPE WITH CAMERA  . LEFT HEART CATH AND CORONARY ANGIOGRAPHY N/A 05/11/2019   Procedure: LEFT HEART CATH AND CORONARY ANGIOGRAPHY;  Surgeon: Sherren Mocha, MD;  Location: [place] CV LAB;  Service: Cardiovascular;  Laterality: N/A;  . REMOVAL OF STONES  12/25/2019   Procedure: Karlyn Agee;  Surgeon: Gatha Mayer, MD;  Location: [facility] ENDOSCOPY;  Service: Gastroenterology;;  . Clide Deutscher  01/06/2020   Procedure: Clide Deutscher;  Surgeon:  Irene Shipper, MD;  Location: [facility] ENDOSCOPY;  Service: Endoscopy;;  . Joan Mayans  12/25/2019   Procedure: SPHINCTEROTOMY;  Surgeon: Gatha Mayer, MD;  Location: [place];  Service: Gastroenterology;;  . TONSILLECTOMY    . TUBAL LIGATION    . UPPER GASTROINTESTINAL ENDOSCOPY       Current Meds  Medication Sig  . ALPRAZolam (XANAX) 0.25 MG tablet Take 1 tablet (0.25 mg total) by mouth 3 (three) times daily as needed for anxiety.  . ARIPiprazole (ABILIFY) 2 MG tablet Take 1 tablet (2 mg total) by mouth daily.  Marland Kitchen aspirin EC 81 MG tablet Take 1 tablet (81 mg total) by mouth daily.  . budesonide (ENTOCORT EC) 3 MG 24 hr capsule TAKE 3 CAPSULES ONCE DAILY. (Patient taking differently: Take 9 mg by mouth daily. )  . buPROPion (WELLBUTRIN SR) 150 MG 12 hr tablet Take 1 tablet (150 mg total) by mouth daily.  Marland Kitchen  busPIRone (BUSPAR) 15 MG tablet Take 1 tablet (15 mg total) by mouth 2 (two) times daily.  . Cholecalciferol (VITAMIN D) 50 MCG (2000 UT) tablet Take 2,000 Units by mouth daily after breakfast.   . denosumab (PROLIA) 60 MG/ML SOSY injection Inject 60 mg into the skin every 6 (six) months.  . folic acid (FOLVITE) 1 MG tablet Take 1 tablet (1 mg total) by mouth daily. (Patient taking differently: Take 1 mg by mouth daily after breakfast. )  . gabapentin (NEURONTIN) 300 MG capsule Take 300 mg by mouth 3 (three) times daily.  . hyoscyamine (ANASPAZ) 0.125 MG TBDP disintergrating tablet Place 0.125 mg under the tongue every 6 (six) hours as needed ([name] cramps).   . morphine (MS CONTIN) 15 MG 12 hr tablet Take 15 mg by mouth 3 (three) times daily.  . Multiple Vitamin (MULTIVITAMIN WITH MINERALS) TABS tablet Take 1 tablet by mouth daily.  . nicotine (NICODERM CQ - DOSED IN MG/24 HOURS) 21 mg/24hr patch Place 1 patch (21 mg total) onto the skin daily.  . nitroGLYCERIN (NITROSTAT) 0.4 MG SL tablet Place 1 tablet (0.4 mg total) under the tongue every 5 (five) minutes as needed for chest pain.  Marland Kitchen  ondansetron (ZOFRAN-ODT) 4 MG disintegrating tablet Take 4 mg by mouth every 8 (eight) hours as needed for nausea or vomiting.  . Oxycodone HCl 10 MG TABS Take 10 mg by mouth 4 (four) times daily.   . pantoprazole (PROTONIX) 40 MG tablet TAKE (1) TABLET TWICE A DAY BEFORE MEALS. (Patient taking differently: Take 40 mg by mouth 2 (two) times daily with a meal. )  . potassium chloride (MICRO-K) 10 MEQ CR capsule Take 10 mEq by mouth 2 (two) times daily.  . rosuvastatin (CRESTOR) 5 MG tablet Take 1 tablet (5 mg total) by mouth every Monday, Wednesday, and Friday at 6 PM.  . sertraline (ZOLOFT) 100 MG tablet Take 2 tablets (200 mg total) by mouth at bedtime.  Marland Kitchen spironolactone (ALDACTONE) 25 MG tablet Take 0.5 tablets (12.5 mg total) by mouth daily.  . valACYclovir (VALTREX) 500 MG tablet Take 500 mg by mouth 2 (two) times daily as needed (flare ups).   . vitamin B-12 (CYANOCOBALAMIN) 100 MCG tablet Take 1 tablet (100 mcg total) by mouth daily. (Patient taking differently: Take 100 mcg by mouth daily after breakfast. )     Allergies:   Atorvastatin, Levofloxacin, Omnicef [cefdinir], Trazodone and nefazodone, Sulfa antibiotics, and Sulfonamide derivatives   Social History   Tobacco Use  . Smoking status: Current Every Day Smoker    Packs/day: 1.00    Years: 30.00    Pack years: 30.00    Types: Cigarettes  . Smokeless tobacco: Never Used  Vaping Use  . Vaping Use: Never used  Substance Use Topics  . Alcohol use: Yes    Alcohol/week: 3.0 standard drinks    Types: 3 Standard drinks or equivalent per week    Comment: occasional  . Drug use: No     Family Hx: The patient's family history includes Alcohol abuse in her daughter; Bipolar disorder in her daughter; Colon cancer in her brother and father; Drug abuse in her daughter; Heart disease in her mother; Prostate cancer in her father; Skin cancer in her sister. There is no history of Esophageal cancer, Rectal cancer, or Stomach cancer.  ROS:    Please see the history of present illness.    General:no colds or fevers, no weight changes Skin:no rashes or ulcers HEENT:no blurred vision, no  congestion CV:see HPI PUL:see HPI GI:no diarrhea now but hx of [name].  no constipation or melena, no indigestion GU:no hematuria, no dysuria MS:no joint pain, no claudication Neuro:no syncope, no lightheadedness Endo:no diabetes, no thyroid disease hemoc followed by Hematology visit in July.     All other systems reviewed and are negative.   Prior CV studies:   The following studies were reviewed today:  Limited Echo 04/11/19 IMPRESSIONS    1. The left ventricle has normal systolic function, with an ejection fraction of 60-65%. The cavity size was normal. Left ventricular diastolic Doppler parameters are consistent with impaired relaxation.  2. The mitral valve is grossly normal.  3. The tricuspid valve was grossly normal.  4. The aortic valve is tricuspid Aortic valve regurgitation was not assessed by color flow Doppler. No stenosis of the aortic valve.  5. Limited study for LV function; full doppler not performed; normal LV function; mild diastolic dysfunction.  .Cath 05/11/19  Conclusion    Mid RCA lesion is 50% stenosed.  Prox Cx to Mid Cx lesion is 30% stenosed.  Prox LAD to Mid LAD lesion is 50% stenosed.  LV end diastolic pressure is low.   1.  Mild to moderate diffuse calcified stenosis of the proximal to mid LAD 2.  Widely patent left main and left circumflex with calcified nonobstructive disease 3.  Mild to moderate focal stenosis of the mid RCA, with lesion no greater than 50% does not appear flow-limiting 4.  Low LVEDP of 4 mmHg  Recommendations: Recommend medical therapy.  There is no target lesion for PCI.  The patient is a heavy smoker and tobacco cessation is recommended.        Labs/Other Tests and Data Reviewed:    EKG:   SR rate 75 normal 05/11/19   Recent Labs: 07/07/2020: ALT 164 07/09/2020:  Magnesium 1.9 07/10/2020: B Natriuretic Peptide 96.9; BUN 27; Creatinine, Ser 0.87; Hemoglobin 14.0; Platelets 319; Potassium 3.3; Sodium 136   Recent Lipid Panel Lab Results  Component Value Date/Time   CHOL 160 05/16/2020 08:15 AM   TRIG 153 (H) 05/16/2020 08:15 AM   HDL 61 05/16/2020 08:15 AM   CHOLHDL 2.6 05/16/2020 08:15 AM   CHOLHDL 2.7 04/09/2019 06:15 AM   LDLCALC 73 05/16/2020 08:15 AM   LDLDIRECT 102.4 05/24/2008 10:37 AM    Wt Readings from Last 3 Encounters:  07/25/20 109 lb (49.4 kg)  07/10/20 104 lb 8 oz (47.4 kg)  06/17/20 111 lb (50.3 kg)     Objective:    Vital Signs:  BP 128/72   Pulse 90   Ht 5\' 1"  (1.549 m)   Wt 109 lb (49.4 kg)   SpO2 96%   BMI 20.60 kg/m    Affect appropriate Frail chronically ill female  HEENT: normal Neck supple with no adenopathy JVP normal no bruits no thyromegaly Lungs clear with no wheezing and good diaphragmatic motion Heart:  S1/S2 no murmur, no rub, gallop or click PMI normal Abdomen: benighn, BS positve, no tenderness, no AAA no bruit.  No HSM or HJR post lap cholecystectomy  Distal pulses intact with no bruits No edema Neuro non-focal Skin warm and dry No muscular weakness   ASSESSMENT & PLAN:    1. CAD:  Moderate RCA/LAD by cath 05/11/19 continue ASA beta blocker  2. Chol:  Given high calcium score and moderate CAD continue crestor M/W/F LDL 47 12/04/19  3. GI:  Biliary colic now post definitive surgery improved  4. Smoking:  Counseled on  cessation lung cancer screening CT per primary   5. GERD per PCP on protonix  6. Diastolic Dysfunction:Lasix d/c due to persistent hypokalemia and started on aldactone 04/22/20  7. COPD: still smoking recent hospitalization with hypercapnic respiratory failure ? Aspiration and diastolic CHF on prednisone and oxygen Hypercapnea made worse by MS continue use f/u with Dr Jenny Reichmann primary and should have referral to pulmonary   COVID-19 Education: The signs and symptoms of COVID-19  were discussed with the patient and how to seek care for testing (follow up with PCP or arrange E-visit).  The importance of social distancing was discussed today.     Medication Adjustments/Labs and Tests Ordered: Current medicines are reviewed at length with the patient today.  Concerns regarding medicines are outlined above.   Tests Ordered:  None   Medication Changes:  None   Disposition:  Follow up in 6 months    Signed, Jenkins Rouge, MD  07/25/2020 10:09 AM    [place]

## 2020-07-15 DIAGNOSIS — S2249XD Multiple fractures of ribs, unspecified side, subsequent encounter for fracture with routine healing: Secondary | ICD-10-CM | POA: Diagnosis not present

## 2020-07-15 DIAGNOSIS — M1990 Unspecified osteoarthritis, unspecified site: Secondary | ICD-10-CM | POA: Diagnosis not present

## 2020-07-15 DIAGNOSIS — J9611 Chronic respiratory failure with hypoxia: Secondary | ICD-10-CM | POA: Diagnosis not present

## 2020-07-15 DIAGNOSIS — M5431 Sciatica, right side: Secondary | ICD-10-CM | POA: Diagnosis not present

## 2020-07-15 DIAGNOSIS — G894 Chronic pain syndrome: Secondary | ICD-10-CM | POA: Diagnosis not present

## 2020-07-15 DIAGNOSIS — I110 Hypertensive heart disease with heart failure: Secondary | ICD-10-CM | POA: Diagnosis not present

## 2020-07-15 DIAGNOSIS — J42 Unspecified chronic bronchitis: Secondary | ICD-10-CM | POA: Diagnosis not present

## 2020-07-15 DIAGNOSIS — M810 Age-related osteoporosis without current pathological fracture: Secondary | ICD-10-CM | POA: Diagnosis not present

## 2020-07-15 DIAGNOSIS — J9612 Chronic respiratory failure with hypercapnia: Secondary | ICD-10-CM | POA: Diagnosis not present

## 2020-07-15 DIAGNOSIS — I5032 Chronic diastolic (congestive) heart failure: Secondary | ICD-10-CM | POA: Diagnosis not present

## 2020-07-16 ENCOUNTER — Other Ambulatory Visit: Payer: Self-pay | Admitting: *Deleted

## 2020-07-16 NOTE — Patient Outreach (Signed)
[facility]) Care Management  07/16/2020  DESIRA ALESSANDRINI 09-19-1951 [id number]  EMMI-GENERAL DISCHARGED-RESOLVED RED ON EMMI ALERT Day #4 Date: 07/15/2020 Red Alert Reason: Questions and Concerns  OUTREACH #1 RN spoke with pt and inquired on the above emmi. Pt does not remember the response to the emmi call and has no questions or concerns at this time. Pt states she is pending an appointment with her provider tomorrow. Pt aware to present any questions or concerns at that time to her provider. No additional needs.  Plan: Will close with all issues resolved at this time with no additional questions or concerns.  Raina Mina, RN Care Management Coordinator [place] Office [phone]

## 2020-07-18 DIAGNOSIS — J9611 Chronic respiratory failure with hypoxia: Secondary | ICD-10-CM | POA: Diagnosis not present

## 2020-07-18 DIAGNOSIS — J9612 Chronic respiratory failure with hypercapnia: Secondary | ICD-10-CM | POA: Diagnosis not present

## 2020-07-18 DIAGNOSIS — G894 Chronic pain syndrome: Secondary | ICD-10-CM | POA: Diagnosis not present

## 2020-07-18 DIAGNOSIS — I110 Hypertensive heart disease with heart failure: Secondary | ICD-10-CM | POA: Diagnosis not present

## 2020-07-18 DIAGNOSIS — J42 Unspecified chronic bronchitis: Secondary | ICD-10-CM | POA: Diagnosis not present

## 2020-07-18 DIAGNOSIS — M5431 Sciatica, right side: Secondary | ICD-10-CM | POA: Diagnosis not present

## 2020-07-18 DIAGNOSIS — S2249XD Multiple fractures of ribs, unspecified side, subsequent encounter for fracture with routine healing: Secondary | ICD-10-CM | POA: Diagnosis not present

## 2020-07-18 DIAGNOSIS — I5032 Chronic diastolic (congestive) heart failure: Secondary | ICD-10-CM | POA: Diagnosis not present

## 2020-07-18 DIAGNOSIS — M1990 Unspecified osteoarthritis, unspecified site: Secondary | ICD-10-CM | POA: Diagnosis not present

## 2020-07-18 DIAGNOSIS — M810 Age-related osteoporosis without current pathological fracture: Secondary | ICD-10-CM | POA: Diagnosis not present

## 2020-07-19 DIAGNOSIS — S2249XD Multiple fractures of ribs, unspecified side, subsequent encounter for fracture with routine healing: Secondary | ICD-10-CM | POA: Diagnosis not present

## 2020-07-19 DIAGNOSIS — M1990 Unspecified osteoarthritis, unspecified site: Secondary | ICD-10-CM | POA: Diagnosis not present

## 2020-07-19 DIAGNOSIS — J9612 Chronic respiratory failure with hypercapnia: Secondary | ICD-10-CM | POA: Diagnosis not present

## 2020-07-19 DIAGNOSIS — J9611 Chronic respiratory failure with hypoxia: Secondary | ICD-10-CM | POA: Diagnosis not present

## 2020-07-19 DIAGNOSIS — Z79891 Long term (current) use of opiate analgesic: Secondary | ICD-10-CM | POA: Diagnosis not present

## 2020-07-19 DIAGNOSIS — M25551 Pain in right hip: Secondary | ICD-10-CM | POA: Diagnosis not present

## 2020-07-19 DIAGNOSIS — I110 Hypertensive heart disease with heart failure: Secondary | ICD-10-CM | POA: Diagnosis not present

## 2020-07-19 DIAGNOSIS — J42 Unspecified chronic bronchitis: Secondary | ICD-10-CM | POA: Diagnosis not present

## 2020-07-19 DIAGNOSIS — M5431 Sciatica, right side: Secondary | ICD-10-CM | POA: Diagnosis not present

## 2020-07-19 DIAGNOSIS — I5032 Chronic diastolic (congestive) heart failure: Secondary | ICD-10-CM | POA: Diagnosis not present

## 2020-07-19 DIAGNOSIS — G894 Chronic pain syndrome: Secondary | ICD-10-CM | POA: Diagnosis not present

## 2020-07-19 DIAGNOSIS — M810 Age-related osteoporosis without current pathological fracture: Secondary | ICD-10-CM | POA: Diagnosis not present

## 2020-07-19 DIAGNOSIS — R0789 Other chest pain: Secondary | ICD-10-CM | POA: Diagnosis not present

## 2020-07-24 ENCOUNTER — Inpatient Hospital Stay: Payer: Medicare HMO | Admitting: Internal Medicine

## 2020-07-24 DIAGNOSIS — J9612 Chronic respiratory failure with hypercapnia: Secondary | ICD-10-CM | POA: Diagnosis not present

## 2020-07-24 DIAGNOSIS — M1990 Unspecified osteoarthritis, unspecified site: Secondary | ICD-10-CM | POA: Diagnosis not present

## 2020-07-24 DIAGNOSIS — I5032 Chronic diastolic (congestive) heart failure: Secondary | ICD-10-CM | POA: Diagnosis not present

## 2020-07-24 DIAGNOSIS — S2249XD Multiple fractures of ribs, unspecified side, subsequent encounter for fracture with routine healing: Secondary | ICD-10-CM | POA: Diagnosis not present

## 2020-07-24 DIAGNOSIS — I110 Hypertensive heart disease with heart failure: Secondary | ICD-10-CM | POA: Diagnosis not present

## 2020-07-24 DIAGNOSIS — G894 Chronic pain syndrome: Secondary | ICD-10-CM | POA: Diagnosis not present

## 2020-07-24 DIAGNOSIS — J9611 Chronic respiratory failure with hypoxia: Secondary | ICD-10-CM | POA: Diagnosis not present

## 2020-07-24 DIAGNOSIS — M5431 Sciatica, right side: Secondary | ICD-10-CM | POA: Diagnosis not present

## 2020-07-24 DIAGNOSIS — J42 Unspecified chronic bronchitis: Secondary | ICD-10-CM | POA: Diagnosis not present

## 2020-07-24 DIAGNOSIS — M810 Age-related osteoporosis without current pathological fracture: Secondary | ICD-10-CM | POA: Diagnosis not present

## 2020-07-25 ENCOUNTER — Ambulatory Visit (INDEPENDENT_AMBULATORY_CARE_PROVIDER_SITE_OTHER): Payer: Medicare HMO | Admitting: Cardiovascular Disease

## 2020-07-25 ENCOUNTER — Other Ambulatory Visit: Payer: Self-pay

## 2020-07-25 ENCOUNTER — Encounter: Payer: Self-pay | Admitting: Internal Medicine

## 2020-07-25 ENCOUNTER — Ambulatory Visit (INDEPENDENT_AMBULATORY_CARE_PROVIDER_SITE_OTHER): Payer: Medicare HMO | Admitting: Internal Medicine

## 2020-07-25 ENCOUNTER — Encounter: Payer: Self-pay | Admitting: Cardiovascular Disease

## 2020-07-25 VITALS — BP 128/72 | HR 90 | Ht 61.0 in | Wt 109.0 lb

## 2020-07-25 VITALS — BP 120/80 | HR 84 | Temp 97.9°F | Ht 61.0 in | Wt 109.0 lb

## 2020-07-25 DIAGNOSIS — I5033 Acute on chronic diastolic (congestive) heart failure: Secondary | ICD-10-CM

## 2020-07-25 DIAGNOSIS — J9621 Acute and chronic respiratory failure with hypoxia: Secondary | ICD-10-CM | POA: Diagnosis not present

## 2020-07-25 DIAGNOSIS — R69 Illness, unspecified: Secondary | ICD-10-CM | POA: Diagnosis not present

## 2020-07-25 DIAGNOSIS — J9622 Acute and chronic respiratory failure with hypercapnia: Secondary | ICD-10-CM

## 2020-07-25 DIAGNOSIS — I2510 Atherosclerotic heart disease of native coronary artery without angina pectoris: Secondary | ICD-10-CM | POA: Diagnosis not present

## 2020-07-25 DIAGNOSIS — J9612 Chronic respiratory failure with hypercapnia: Secondary | ICD-10-CM

## 2020-07-25 DIAGNOSIS — R739 Hyperglycemia, unspecified: Secondary | ICD-10-CM

## 2020-07-25 DIAGNOSIS — F411 Generalized anxiety disorder: Secondary | ICD-10-CM | POA: Diagnosis not present

## 2020-07-25 LAB — CBC WITH DIFFERENTIAL/PLATELET
Absolute Monocytes: 616 cells/uL (ref 200–950)
Basophils Absolute: 39 cells/uL (ref 0–200)
Basophils Relative: 0.5 %
Eosinophils Absolute: 94 cells/uL (ref 15–500)
Eosinophils Relative: 1.2 %
HCT: 37.3 % (ref 35.0–45.0)
Hemoglobin: 12.3 g/dL (ref 11.7–15.5)
Lymphs Abs: 3526 cells/uL (ref 850–3900)
MCH: 31 pg (ref 27.0–33.0)
MCHC: 33 g/dL (ref 32.0–36.0)
MCV: 94 fL (ref 80.0–100.0)
MPV: 9.2 fL (ref 7.5–12.5)
Monocytes Relative: 7.9 %
Neutro Abs: 3526 cells/uL (ref 1500–7800)
Neutrophils Relative %: 45.2 %
Platelets: 367 10*3/uL (ref 140–400)
RBC: 3.97 10*6/uL (ref 3.80–5.10)
RDW: 11.3 % (ref 11.0–15.0)
Total Lymphocyte: 45.2 %
WBC: 7.8 10*3/uL (ref 3.8–10.8)

## 2020-07-25 LAB — COMPLETE METABOLIC PANEL WITH GFR
AG Ratio: 1.3 (calc) (ref 1.0–2.5)
ALT: 7 U/L (ref 6–29)
AST: 13 U/L (ref 10–35)
Albumin: 3.5 g/dL — ABNORMAL LOW (ref 3.6–5.1)
Alkaline phosphatase (APISO): 58 U/L (ref 37–153)
BUN: 12 mg/dL (ref 7–25)
CO2: 29 mmol/L (ref 20–32)
Calcium: 9 mg/dL (ref 8.6–10.4)
Chloride: 105 mmol/L (ref 98–110)
Creat: 0.84 mg/dL (ref 0.50–0.99)
GFR, Est African American: 83 mL/min/{1.73_m2} (ref >=60)
GFR, Est Non African American: 72 mL/min/{1.73_m2} (ref >=60)
Globulin: 2.8 g/dL (calc) (ref 1.9–3.7)
Glucose, Bld: 79 mg/dL (ref 65–99)
Potassium: 4 mmol/L (ref 3.5–5.3)
Sodium: 140 mmol/L (ref 135–146)
Total Bilirubin: 0.4 mg/dL (ref 0.2–1.2)
Total Protein: 6.3 g/dL (ref 6.1–8.1)

## 2020-07-25 NOTE — Patient Instructions (Addendum)
Ok to stop the xanax and wellbutrin  You will be contacted regarding the referral for: pulmonary function tests  Please continue all other medications as before, and refills have been done if requested.  Please have the pharmacy call with any other refills you may need.  Please continue your efforts at being more active, low cholesterol diet, and weight control.  Please keep your appointments with your specialists as you may have planned  Please go to the LAB at the blood drawing area for the tests to be done  You will be contacted by phone if any changes need to be made immediately.  Otherwise, you will receive a letter about your results with an explanation, but please check with MyChart first.  Please remember to sign up for MyChart if you have not done so, as this will be important to you in the future with finding out test results, communicating by private email, and scheduling acute appointments online when needed.  Please make an Appointment to return in 3 months

## 2020-07-25 NOTE — Patient Instructions (Signed)

## 2020-07-25 NOTE — Progress Notes (Deleted)
Subjective:    Patient ID: Denise King, female    DOB: 1951/03/31, 69 y.o.   MRN: [id number]  HPI  Her to f/u recent hospn 7/8 - 7/14    Review of Systems     Objective:   Physical Exam        Assessment & Plan:   Transitional Care Management elements:  1)  Date of D/C: as above 2)  Medication reconciliation:  done today at end visit 3)  Review of D/C summary or other information:  done today 4)  Review of need for f/u on pending diagnostic tests and treatments:  done today for:   Unresulted Labs (From admission, onward)   Comment                                         Start           Ordered        07/11/20 0500        Creatinine, serum  (enoxaparin (LOVENOX)    CrCl < 30 ml/min)  Weekly,   R       Comments: while on enoxaparin therapy.         07/04/20 1728        07/10/20 0737        Basic metabolic panel  Once,   R         Question:  Specimen collection method  Answer:  Lab=Lab collect      07/10/20 0736        07/06/20 1012        Brain natriuretic peptide  Daily,   R       Question:  Specimen collection method  Answer:  Lab=Lab collect      07/06/20 1011                          Home Health:  yes  Equipment/Devices: Home oxygen     Discharge Condition: Stable  CODE STATUS: Full code  Diet recommendation: Low-sodium     Subjective: Patient seen and examined.  Still complains of weakness but not willing to go to SNF and wants to go home instead.  Still complains of pleuritic chest pain but that is getting better.     Brief/Interim Summary: Denise King is a 69 year old female with pmhx of HFpEF, chronic pain, bipolar disorder, tobacco use, alcohol abuse who presented with AMS and emesis found to have acute on chronic HF exacerbation with hypercarbia requiring Bipap for respiratory support .      According to HPI, she was in her normal state of  health until 2 days before coming to the ED and reportedly patient was leaning to her right side, her hands were shaky, and she was experiencing some dizziness.. Patient had no facial droop , slurred speech or  focal deficits at that time according to her daughter and thought the leaning was  secondary to chronic sciatic pain and chronic pain from right femur fracture in October 2019. Acute event happened the morning of presentation when her other daughter found her vomiting. She seemed altered and was helped back to bed. She slept until 2 pm and could not be woken up initially. CPR was attempted and patient woke up. EMS found her in bed with emesis. Noted not to have no focal deficits  and responding to verbal stimuli.  She was brought to the emergency department.  She was found to be having acute hypoxic and hypercapnic respiratory failure.  Chest x-ray was compatible with pulmonary edema. Bipap initiated for Co2 of 78.4 and pH 7.197   She was then she was then weaned off BiPAP on 07/05/20. She was started on iv lasix. nocturnal BiPAP was continued for some time.   On 7/10, Hypercarbic with PCO2 almost 80 placed back on BiPAP, later titrated to supplemental oxygen via high flow device titrating FiO2, still lethargic but when she was awake she asks for pain pills. Narcotics were stopped as they were thought to be contributing to her hypercarbia along with undiagnosed COPD as she is long term smoker.  low dose narcotics were resumed per family's strong insistence as patient was constantly complaining of headache and pain. She was titrated back to [place] oxygen and at that point in time she was transferred to floor under care of hospitalist on 07/08/20. She was then seen by PT/OT and they recommended HHPT. She has improved significantly but still c/o some chest pain and tenderness due to CPR performed pre admission. Now on 2 liter of oxygen. She told me that back in Jan 2021 when she was hospitalized, she was discharged  on home O2 and she required it for 3 weeks and still has equipment at home. She also had intermittent hypokalemia this hospitalization which was replenished. She was also started on prednisone for 4 days for possible copd component. She is now stable for discharge, although due to weakness that she still complains, she was offered discharge to SNF but she declined that. In the past, she was taken off of lasix by her cardiologist due to persistent hypokalemia so she is being discharged back on her kcl and aldactone and will f/u with her cardiologist.      Discharge Diagnoses:   Active Problems:    CIGARETTE SMOKER    Acute on chronic heart failure with preserved ejection fraction (HCC)    Acute on chronic heart failure (HCC)    Acute on chronic respiratory failure with hypoxia and hypercapnia (HCC)           Discharge Instructions             Allergies as of 07/10/2020                 Reactions         Atorvastatin   Nausea And Vomiting        Levofloxacin   Other (See Comments)        Reaction: achilles tendon pain        Omnicef [cefdinir]   Diarrhea        Uncontrollable diarrhea        Trazodone And Nefazodone   Other (See Comments)        "Felt like I was going to faint"        Sulfa Antibiotics   Rash        Sulfonamide Derivatives   Rash                          Medication List              STOP taking these medications          morphine 60 MG 12 hr tablet  Commonly known as: MS CONTIN  TAKE these medications          ALPRAZolam 0.25 MG tablet  Commonly known as: XANAX  Take 1 tablet (0.25 mg total) by mouth 3 (three) times daily as needed for anxiety.       ARIPiprazole 2 MG tablet  Commonly known as: ABILIFY  Take 1 tablet (2 mg total) by mouth daily.       aspirin EC 81 MG tablet  Take 1 tablet (81 mg total) by mouth  daily.       budesonide 3 MG 24 hr capsule  Commonly known as: ENTOCORT EC  TAKE 3 CAPSULES ONCE DAILY.  What changed: See the new instructions.       buPROPion 150 MG 12 hr tablet  Commonly known as: WELLBUTRIN SR  Take 1 tablet (150 mg total) by mouth daily.       busPIRone 15 MG tablet  Commonly known as: BUSPAR  Take 1 tablet (15 mg total) by mouth 2 (two) times daily.  Start taking on: July 15, 2020       denosumab 60 MG/ML Sosy injection  Commonly known as: PROLIA  Inject 60 mg into the skin every 6 (six) months.       folic acid 1 MG tablet  Commonly known as: FOLVITE  Take 1 tablet (1 mg total) by mouth daily.  What changed: when to take this       gabapentin 300 MG capsule  Commonly known as: NEURONTIN  Take 300 mg by mouth 3 (three) times daily.       hyoscyamine 0.125 MG Tbdp disintergrating tablet  Commonly known as: ANASPAZ  Place 0.125 mg under the tongue every 6 (six) hours as needed ([name] cramps).       multivitamin with minerals Tabs tablet  Take 1 tablet by mouth daily.       nicotine 21 mg/24hr patch  Commonly known as: NICODERM CQ - dosed in mg/24 hours  Place 1 patch (21 mg total) onto the skin daily.       nitroGLYCERIN 0.4 MG SL tablet  Commonly known as: NITROSTAT  Place 1 tablet (0.4 mg total) under the tongue every 5 (five) minutes as needed for chest pain.       ondansetron 4 MG disintegrating tablet  Commonly known as: ZOFRAN-ODT  Take 4 mg by mouth every 8 (eight) hours as needed for nausea or vomiting.       Oxycodone HCl 10 MG Tabs  Take 10 mg by mouth 4 (four) times daily.       pantoprazole 40 MG tablet  Commonly known as: PROTONIX  TAKE (1) TABLET TWICE A DAY BEFORE MEALS.  What changed: See the new instructions.       potassium chloride 10 MEQ CR capsule  Commonly known as: MICRO-K  Take 10 mEq by mouth 2 (two) times daily.       rosuvastatin 5 MG  tablet  Commonly known as: CRESTOR  Take 1 tablet (5 mg total) by mouth every Monday, Wednesday, and Friday at 6 PM.       sertraline 100 MG tablet  Commonly known as: ZOLOFT  Take 2 tablets (200 mg total) by mouth at bedtime.       spironolactone 25 MG tablet  Commonly known as: ALDACTONE  Take 0.5 tablets (12.5 mg total) by mouth daily.       valACYclovir 500 MG tablet  Commonly known as: VALTREX  Take 500 mg by mouth 2 (two) times  daily as needed (flare ups).       vitamin B-12 100 MCG tablet  Commonly known as: CYANOCOBALAMIN  Take 1 tablet (100 mcg total) by mouth daily.  What changed: when to take this       Vitamin D 50 MCG (2000 UT) tablet  Take 2,000 Units by mouth daily after breakfast.                                              Durable Medical Equipment    (From admission, onward)                                          Start           Ordered        07/10/20 0957        For home use only DME oxygen  Once           Question   Answer   Comment    Length of Need   Lifetime        Mode or (Route)   Nasal cannula        Liters per Minute   2        Frequency   Continuous (stationary and portable oxygen unit needed)        Oxygen delivery system   Gas             07/10/20 0956                                  Follow-up Information                 [facility] Follow up.     Why: oxygen  Contact information:  [address]  [place] [address]  [phone]                              [facility] up.     Specialty: Home Health Services  Why: [facility], HHPT, [facility]  Contact information:  [place] [address]  [phone]                              Biagio Borg, MD.      Specialties: Internal Medicine, Radiology  Why: July 28,2021 @11 :20 AM  Contact information:  [place] [address]  [phone]                                           Allergies    Allergen   Reactions       Atorvastatin   Nausea And Vomiting       Levofloxacin   Other (See Comments)            Reaction: achilles tendon pain       Omnicef [Cefdinir]   Diarrhea  Uncontrollable diarrhea       Trazodone And Nefazodone   Other (See Comments)            "Felt like I was going to faint"       Sulfa Antibiotics   Rash       Sulfonamide Derivatives   Rash          Consultations: pccm        Procedures/Studies:  Imaging Results    CT Head Wo Contrast       Result Date: 07/04/2020   CLINICAL DATA:  Altered mental status EXAM: CT HEAD WITHOUT CONTRAST TECHNIQUE: Contiguous axial images were obtained from the base of the skull through the vertex without intravenous contrast. COMPARISON:  09/10/2019 FINDINGS: Brain: No evidence of acute infarction, hemorrhage, hydrocephalus, extra-axial collection or mass lesion/mass effect. Scattered low-density changes within the periventricular and subcortical white matter compatible with chronic microvascular ischemic change. Mild diffuse cerebral volume loss. Vascular: Atherosclerotic calcifications involving the large vessels of the skull base. No unexpected hyperdense vessel. Skull: Normal. Negative for fracture or focal lesion. Sinuses/Orbits: No acute finding. Other: None. IMPRESSION: 1. No acute intracranial findings. 2. Chronic microvascular ischemic change and cerebral volume loss. Electronically Signed   By: Davina Poke D.O.   On: 07/04/2020 16:16        CT ABDOMEN PELVIS W CONTRAST       Result Date: 06/11/2020   CLINICAL DATA:  Right upper quadrant abdominal pain, nausea and  vomiting, diarrhea, symptoms for 3 days EXAM: CT ABDOMEN AND PELVIS WITH CONTRAST TECHNIQUE: Multidetector CT imaging of the abdomen and pelvis was performed using the standard protocol following bolus administration of intravenous contrast. CONTRAST:  129mL OMNIPAQUE IOHEXOL 300 MG/ML  SOLN COMPARISON:  12/23/2019 FINDINGS: Lower chest: No acute pleural or parenchymal lung disease. Hepatobiliary: No focal liver abnormality is seen. Status post cholecystectomy. No biliary dilatation. Pancreas: Unremarkable. No pancreatic ductal dilatation or surrounding inflammatory changes. Pancreatic divisum again noted. Spleen: Normal in size without focal abnormality. Adrenals/Urinary Tract: Mild bilateral renal cortical atrophy. Small renal cysts are again noted. The adrenals are unremarkable. No urinary tract calculi or obstructive uropathy. Bladder is decompressed limiting its evaluation. Stomach/Bowel: No bowel obstruction or ileus. The appendix is surgically absent. No bowel wall thickening or inflammatory change. Vascular/Lymphatic: Aortic atherosclerosis. No enlarged abdominal or pelvic lymph nodes. Reproductive: Uterus and bilateral adnexa are unremarkable. Other: No free fluid or free gas. No abdominal wall hernia. Musculoskeletal: Stable postsurgical changes of the lumbar spine and right femur. No acute or destructive bony lesions. Reconstructed images demonstrate no additional findings. IMPRESSION: 1. No acute intra-abdominal or intrapelvic process. 2.  Emphysema (ICD10-J43.9). Electronically Signed   By: Randa Ngo M.D.   On: 06/11/2020 21:13        DG CHEST PORT 1 VIEW       Result Date: 07/08/2020   CLINICAL DATA:  CHF. EXAM: PORTABLE CHEST 1 VIEW COMPARISON:  Radiograph 07/06/2020. chest CT 12/23/2019 FINDINGS: Patient is rotated limiting assessment. Cardiomegaly is grossly stable. Mediastinal contours grossly stable. Improving interstitial opacities likely resolving pulmonary edema.  Persistent ill-defined opacity in the right infrahilar region. Streaky left lung base opacities. No pneumothorax. Stable osseous structures. IMPRESSION: 1. Improving interstitial opacities likely resolving pulmonary edema. 2. Persistent ill-defined opacity in the right infrahilar region, may represent atelectasis or pneumonia. Streaky left lung base opacities, favor atelectasis. Electronically Signed   By: Keith Rake M.D.   On: 07/08/2020 17:54  DG Chest Port 1 View       Result Date: 07/06/2020   CLINICAL DATA:  Shortness of breath EXAM: PORTABLE CHEST 1 VIEW COMPARISON:  July 04, 2020 FINDINGS: There are fairly small pleural effusions bilaterally with bibasilar atelectatic change. Lungs elsewhere are clear. Heart is mildly enlarged with pulmonary vascularity normal. No adenopathy. There is aortic atherosclerosis. Bones are osteoporotic. IMPRESSION: Fairly small pleural effusions bilaterally with bibasilar atelectasis. Lungs elsewhere clear. Stable cardiac prominence. Aortic Atherosclerosis (ICD10-I70.0). Electronically Signed   By: Lowella Grip III M.D.   On: 07/06/2020 13:10        DG Chest Port 1 View       Result Date: 07/04/2020   CLINICAL DATA:  Unresponsive, altered mental status EXAM: PORTABLE CHEST 1 VIEW COMPARISON:  12/30/2019 FINDINGS: Rotated AP portable examination with mild cardiomegaly. Mild, diffuse interstitial pulmonary opacity. No focal airspace opacity. IMPRESSION: Rotated AP portable examination with mild cardiomegaly and mild, diffuse interstitial pulmonary opacity, likely mild edema. No focal airspace opacity. Electronically Signed   By: Eddie Candle M.D.   On: 07/04/2020 15:42        ECHOCARDIOGRAM LIMITED       Result Date: 07/08/2020      ECHOCARDIOGRAM LIMITED REPORT   Patient Name:   MALAI LADY Date of Exam: 07/08/2020 Medical Rec #:  [id number]           Height:       62.0 in Accession #:    [id number]           Weight:       111.3 lb Date of Birth:  [birth year]           BSA:          1.490 m Patient Age:    83 years            BP:           112/69 mmHg Patient Gender: F                   HR:           75 bpm. Exam Location:  Inpatient Procedure: Limited Echo, Cardiac Doppler and Color Doppler Indications:    CHF-Acute Systolic 170.01 / V49.44  History:        Patient has prior history of Echocardiogram examinations, most                 recent 02/01/2020. CHF, Signs/Symptoms:Chest Pain and Shortness of                 Breath; Risk Factors:Current Smoker. GERD.  Sonographer:    Vickie Epley RDCS Referring Phys: [id number] Jansen  1. Left ventricular ejection fraction, by estimation, is 60 to 65%. The left ventricle has normal function. The left ventricle has no regional wall motion abnormalities. Left ventricular diastolic parameters are consistent with Grade I diastolic dysfunction (impaired relaxation).  2. Right ventricular systolic function is normal. The right ventricular size is normal.  3. The mitral valve is normal in structure. Trivial mitral valve regurgitation. No evidence of mitral stenosis.  4. The aortic valve is tricuspid. Aortic valve regurgitation is not visualized. No aortic stenosis is present.  5. The inferior vena cava is normal in size with greater than 50% respiratory variability, suggesting right atrial pressure of 3 mmHg. FINDINGS  Left Ventricle: Left ventricular ejection fraction, by estimation, is 60 to 65%. The left ventricle has normal function. The  left ventricle has no regional wall motion abnormalities. The left ventricular internal cavity size was normal in size. There is  no left ventricular hypertrophy. Right Ventricle: The right ventricular size is normal. Right ventricular systolic function is normal. Left Atrium: Left atrial size was normal in size. Right Atrium: Right atrial size was normal in size. Pericardium: There is no evidence of pericardial effusion. Mitral  Valve: The mitral valve is normal in structure. Normal mobility of the mitral valve leaflets. Trivial mitral valve regurgitation. No evidence of mitral valve stenosis. Tricuspid Valve: The tricuspid valve is normal in structure. Tricuspid valve regurgitation is trivial. No evidence of tricuspid stenosis. Aortic Valve: The aortic valve is tricuspid. Aortic valve regurgitation is not visualized. No aortic stenosis is present. Pulmonic Valve: The pulmonic valve was normal in structure. Pulmonic valve regurgitation is not visualized. No evidence of pulmonic stenosis. Aorta: The aortic root is normal in size and structure. Venous: The inferior vena cava is normal in size with greater than 50% respiratory variability, suggesting right atrial pressure of 3 mmHg.  LEFT VENTRICLE PLAX 2D LVIDd:         3.90 cm     Diastology LVIDs:         2.70 cm     LV e' lateral:   9.20 cm/s LV PW:         0.90 cm     LV E/e' lateral: 7.2 LV IVS:        0.90 cm     LV e' medial:    5.43 cm/s LVOT diam:     2.00 cm     LV E/e' medial:  12.2 LV SV:         67 LV SV Index:   45 LVOT Area:     3.14 cm  LV Volumes (MOD) LV vol d, MOD A2C: 56.6 ml LV vol d, MOD A4C: 68.0 ml LV vol s, MOD A2C: 22.6 ml LV vol s, MOD A4C: 31.5 ml LV SV MOD A2C:     34.0 ml LV SV MOD A4C:     68.0 ml LV SV MOD BP:      35.4 ml LEFT ATRIUM         Index LA diam:    2.60 cm 1.74 cm/m  AORTIC VALVE LVOT Vmax:   110.00 cm/s LVOT Vmean:  75.600 cm/s LVOT VTI:    0.214 m  AORTA Ao Root diam: 2.90 cm MITRAL VALVE MV Area (PHT): 2.39 cm    SHUNTS MV Decel Time: 317 msec    Systemic VTI:  0.21 m MV E velocity: 66.40 cm/s  Systemic Diam: 2.00 cm MV A velocity: 88.70 cm/s MV E/A ratio:  0.75 Kirk Ruths MD Electronically signed by Kirk Ruths MD Signature Date/Time: 07/08/2020/12:40:03 PM    Final                 Discharge Exam:       Vitals:        07/10/20 0713   07/10/20 0846    BP:   119/89        Pulse:   77        Resp:    16        Temp:   98.4 F (36.9 C)        SpO2:   91%   (!) 88%              Vitals:        07/10/20  1497   07/10/20 0451   07/10/20 0713   07/10/20 0846    BP:   115/77       119/89        Pulse:   65       77        Resp:   15       16        Temp:   97.6 F (36.4 C)       98.4 F (36.9 C)        TempSrc:   Oral       Oral        SpO2:   90%       91%   (!) 88%    Weight:       47.4 kg                  General: Pt is alert, awake, not in acute distress  Cardiovascular: RRR, S1/S2 +, no rubs, no gallops  Respiratory: CTA bilaterally, no wheezing, no rhonchi, pont tenderness anterior mid and left chest.  Abdominal: Soft, NT, ND, bowel sounds +  Extremities: no edema, no cyanosis            The results of significant diagnostics from this hospitalization (including imaging, microbiology, ancillary and laboratory) are listed below for reference.           Microbiology:          Recent Results (from the past 240 hour(s))    SARS Coronavirus 2 by RT PCR (hospital order, performed in [facility] hospital lab) Nasopharyngeal Nasopharyngeal Swab     Status: None        Collection Time: 07/04/20  3:11 PM        Specimen: Nasopharyngeal Swab    Result   Value   Ref Range   Status        SARS Coronavirus 2   NEGATIVE   NEGATIVE   Final            Comment:   (NOTE)  SARS-CoV-2 target nucleic acids are NOT DETECTED.     The SARS-CoV-2 RNA is generally detectable in upper and lower  respiratory specimens during the acute phase of infection. The lowest  concentration of SARS-CoV-2 viral copies this assay can detect is 250  copies / mL. A negative result does not preclude SARS-CoV-2 infection  and should not be used as the sole basis for treatment or other  patient management decisions.  A negative result may occur  with  improper specimen collection / handling, submission of specimen other  than nasopharyngeal swab, presence of viral mutation(s) within the  areas targeted by this assay, and inadequate number of viral copies  (<250 copies / mL). A negative result must be combined with clinical  observations, patient history, and epidemiological information.     Fact Sheet for Patients:    StrictlyIdeas.no     Fact Sheet for Healthcare Providers:  BankingDealers.co.za     This test is not yet approved or  cleared by the [place] FDA and  has been authorized for detection and/or diagnosis of SARS-CoV-2 by  FDA under an Emergency Use Authorization (EUA).  This EUA will remain  in effect (meaning this test can be used) for the duration of the  COVID-19 declaration under Section 564(b)(1) of the Act, 21 U.S.C.  section 360bbb-3(b)(1), unless the authorization is terminated or  revoked sooner.     Performed  at [facility], [place] [address] [place], [place]  [address]       Culture, blood (routine x 2)     Status: None        Collection Time: 07/04/20  7:53 PM        Specimen: BLOOD    Result   Value   Ref Range   Status        Specimen Description   BLOOD RIGHT ANTECUBITAL       Final        Special Requests           Final            BOTTLES DRAWN AEROBIC ONLY Blood Culture adequate volume        Culture           Final            NO GROWTH 5 DAYS  Performed at [facility], [place] [address], [place], [place] [address]           Report Status   07/09/2020 FINAL       Final    MRSA PCR Screening     Status: None        Collection Time: 07/04/20  7:54 PM        Specimen: Nasal Mucosa; Nasopharyngeal    Result   Value   Ref Range   Status        MRSA by PCR   NEGATIVE   NEGATIVE   Final            Comment:            The GeneXpert MRSA Assay (FDA  approved for NASAL specimens  only), is one component of a  comprehensive MRSA colonization  surveillance program. It is not  intended to diagnose MRSA  infection nor to guide or  monitor treatment for  MRSA infections.  Performed at [facility], [place] [address] [place], [place] [address]       Culture, blood (routine x 2)     Status: None        Collection Time: 07/04/20  7:56 PM        Specimen: BLOOD LEFT HAND    Result   Value   Ref Range   Status        Specimen Description   BLOOD LEFT HAND       Final        Special Requests           Final            BOTTLES DRAWN AEROBIC ONLY Blood Culture adequate volume        Culture           Final            NO GROWTH 5 DAYS  Performed at [facility], [place] [address] [place], [place] [address]           Report Status   07/09/2020 FINAL       Final          Labs:  BNP (last 3 results)  Recent Labs (within last 365 days)          Recent Labs        07/08/20  0232   07/09/20  0247   07/10/20  0252    BNP   230.8*   97.5   96.9  Basic Metabolic Panel:  Last Labs                   Recent Labs    Lab   07/04/20  1521   07/04/20  1953   07/05/20  0327   07/06/20  0024   07/06/20  1124   07/06/20  1124   07/06/20  2127   07/07/20  0334   07/07/20  0434   07/08/20  0232   07/09/20  0955    NA     < >    --    138     < >   138     < >   138   138   136   134*   134*    K     < >    --    3.5     < >   3.2*     < >   2.9*   2.9*   3.0*   4.0   3.8    CL     < >    --    98    --    87*    --    85*   86*    --    91*   92*    CO2     < >    --    28    --    39*    --    38*   37*    --    31   28    GLUCOSE      < >    --    104*    --    129*    --    160*   138*    --    138*   87    BUN     < >    --    19    --    20    --    19   22    --    28*   28*    CREATININE     < >    --    1.60*    --    0.99    --    0.89   0.93    --    1.00   0.97    CALCIUM     < >    --    7.6*    --    8.1*    --    8.5*   8.5*    --    9.1   9.7    MG    --    1.8   1.8    --     --     --     --    2.0    --     --    1.9    PHOS    --    5.7*   5.6*    --     --     --     --    1.8*    --    2.4*   6.6*     < > = values in this interval not displayed.         Liver Function Tests:  Last Labs  Recent Labs    Lab   07/04/20  1501   07/06/20  1124   07/07/20  0334    AST   76*   169*   196*    ALT   24   136*   164*    ALKPHOS   63   56   58    BILITOT   1.4*   0.7   0.9    PROT   6.6   6.5   7.0    ALBUMIN   3.7   3.3*   3.4*          Last Labs    No results for input(s): LIPASE, AMYLASE in the last 168 hours.     Last Labs    No results for input(s): AMMONIA in the last 168 hours.    CBC:  Last Labs                 Recent Labs    Lab   07/04/20  1501   07/04/20  1518   07/06/20  0301   07/06/20  0301   07/07/20  0334   07/07/20  0434   07/08/20  1300   07/09/20  0802   07/10/20  0930    WBC   24.2*     < >   15.2*    --    14.8*    --    17.2*   14.1*   13.6*    NEUTROABS   20.3*    --     --     --     --     --    16.1*   10.6*   8.4*    HGB   14.5     < >   12.4     < >   13.3   13.9   13.2   12.4   14.0    HCT   46.8*     < >   39.4     < >   40.5   41.0   40.7   37.9   41.2    MCV   101.3*     < >   96.8    --    93.3    --    94.7    93.6   92.4    PLT   267     < >   223    --    270    --    297   286   319     < > = values in this interval not displayed.         Cardiac Enzymes:   Last Labs    No results for input(s): CKTOTAL, CKMB, CKMBINDEX, TROPONINI in the last 168 hours.    BNP:   Last Labs    Invalid input(s): POCBNP    CBG:  Last Labs          Recent Labs    Lab   07/04/20  1936   07/05/20  1945    GLUCAP   133*   130*         D-Dimer   Recent Labs (last 2 labs)    No results for input(s): DDIMER in the last 72 hours.    Hgb A1c   Recent Labs (last 2 labs)    No results for input(s): HGBA1C in the last 72 hours.  Lipid Profile   Recent Labs (last 2 labs)    No results for input(s): CHOL, HDL, LDLCALC, TRIG, CHOLHDL, LDLDIRECT in the last 72 hours.    Thyroid function studies    Recent Labs (last 2 labs)     No results for input(s): TSH, T4TOTAL, T3FREE, THYROIDAB in the last 72 hours.    Invalid input(s): FREET3     Anemia work up   [employer] (last 2 labs)    No results for input(s): VITAMINB12, FOLATE, FERRITIN, TIBC, IRON, RETICCTPCT in the last 72 hours.    Urinalysis  Labs (Brief)               Component   Value   Date/Time        COLORURINE   AMBER (A)   07/04/2020 1602        APPEARANCEUR   HAZY (A)   07/04/2020 1602        LABSPEC   1.017   07/04/2020 1602        PHURINE   5.0   07/04/2020 1602        GLUCOSEU   NEGATIVE   07/04/2020 1602        GLUCOSEU   NEGATIVE   12/22/2017 1650        HGBUR   NEGATIVE   07/04/2020 1602        BILIRUBINUR   NEGATIVE   07/04/2020 1602        BILIRUBINUR   neg   02/07/2016 1627        KETONESUR   NEGATIVE   07/04/2020 1602        PROTEINUR   100 (A)   07/04/2020 1602        UROBILINOGEN   0.2   12/22/2017 1650        NITRITE   NEGATIVE    07/04/2020 1602        LEUKOCYTESUR   NEGATIVE   07/04/2020 1602         Sepsis Labs   Last Labs    Invalid input(s): PROCALCITONIN,  WBC,  LACTICIDVEN    Microbiology          Recent Results (from the past 240 hour(s))    SARS Coronavirus 2 by RT PCR (hospital order, performed in [place] hospital lab) Nasopharyngeal Nasopharyngeal Swab     Status: None        Collection Time: 07/04/20  3:11 PM        Specimen: Nasopharyngeal Swab    Result   Value   Ref Range   Status        SARS Coronavirus 2   NEGATIVE   NEGATIVE   Final            Comment:   (NOTE)  SARS-CoV-2 target nucleic acids are NOT DETECTED.     The SARS-CoV-2 RNA is generally detectable in upper and lower  respiratory specimens during the acute phase of infection. The lowest  concentration of SARS-CoV-2 viral copies this assay can detect is 250  copies / mL. A negative result does not preclude SARS-CoV-2 infection  and should not be used as the sole basis for treatment or other  patient management decisions.  A negative result may occur with  improper specimen collection / handling, submission of specimen other  than nasopharyngeal swab, presence of viral mutation(s) within the  areas targeted by this assay, and inadequate number of viral copies  (<250 copies / mL). A negative  result must be combined with clinical  observations, patient history, and epidemiological information.     Fact Sheet for Patients:    StrictlyIdeas.no     Fact Sheet for Healthcare Providers:  BankingDealers.co.za     This test is not yet approved or  cleared by the [place] FDA and  has been authorized for detection and/or diagnosis of SARS-CoV-2 by  FDA under an Emergency Use Authorization (EUA).  This EUA will remain  in effect (meaning this test can be used) for the duration of the  COVID-19  declaration under Section 564(b)(1) of the Act, 21 U.S.C.  section 360bbb-3(b)(1), unless the authorization is terminated or  revoked sooner.     Performed at [facility], [place] [address] [place], [place]  [address]       Culture, blood (routine x 2)     Status: None        Collection Time: 07/04/20  7:53 PM        Specimen: BLOOD    Result   Value   Ref Range   Status        Specimen Description   BLOOD RIGHT ANTECUBITAL       Final        Special Requests           Final            BOTTLES DRAWN AEROBIC ONLY Blood Culture adequate volume        Culture           Final            NO GROWTH 5 DAYS  Performed at [facility], [place] [address] [place], [place] [address]           Report Status   07/09/2020 FINAL       Final    MRSA PCR Screening     Status: None        Collection Time: 07/04/20  7:54 PM        Specimen: Nasal Mucosa; Nasopharyngeal    Result   Value   Ref Range   Status        MRSA by PCR   NEGATIVE   NEGATIVE   Final            Comment:           The GeneXpert MRSA Assay (FDA  approved for NASAL specimens  only), is one component of a  comprehensive MRSA colonization  surveillance program. It is not  intended to diagnose MRSA  infection nor to guide or  monitor treatment for  MRSA infections.  Performed at [facility], [place] [address] [place], [place] [address]       Culture, blood (routine x 2)     Status: None        Collection Time: 07/04/20  7:56 PM        Specimen: BLOOD LEFT HAND    Result   Value   Ref Range   Status        Specimen Description   BLOOD LEFT HAND       Final        Special Requests           Final            BOTTLES DRAWN AEROBIC ONLY Blood Culture adequate volume        Culture  Final            NO GROWTH 5 DAYS  Performed  at [facility], [place] [address] [place], [place] [address]           Report Status   07/09/2020 FINAL       Final             Time coordinating discharge: Over 30 minutes     SIGNED:        Darliss Cheney, MD        [facility]  07/10/2020, 10:36 AM     If 7PM-7AM, please contact night-coverage  [email or web address]                Electronically signed by Darliss Cheney, MD at 07/10/2020 10:36 AM                     ED to Hosp-Admission (Discharged) on 07/04/2020               Routing History               Detailed Report             Note shared with patient   5)  Review of need for Interaction with other providers who will assume or resume care of pt specific problems: done today 6)  Education of patient/family/guardian or caregiver: done today 7)  Establishment or Re-establishment of referrals and arranging for needed community resources:  done today 8)  Assistance in scheduling any required follow up with community providers and services:  done today

## 2020-07-26 ENCOUNTER — Encounter: Payer: Self-pay | Admitting: Internal Medicine

## 2020-07-26 DIAGNOSIS — S2249XD Multiple fractures of ribs, unspecified side, subsequent encounter for fracture with routine healing: Secondary | ICD-10-CM | POA: Diagnosis not present

## 2020-07-26 DIAGNOSIS — M5431 Sciatica, right side: Secondary | ICD-10-CM | POA: Diagnosis not present

## 2020-07-26 DIAGNOSIS — M1990 Unspecified osteoarthritis, unspecified site: Secondary | ICD-10-CM | POA: Diagnosis not present

## 2020-07-26 DIAGNOSIS — J9611 Chronic respiratory failure with hypoxia: Secondary | ICD-10-CM | POA: Diagnosis not present

## 2020-07-26 DIAGNOSIS — I5032 Chronic diastolic (congestive) heart failure: Secondary | ICD-10-CM | POA: Diagnosis not present

## 2020-07-26 DIAGNOSIS — J42 Unspecified chronic bronchitis: Secondary | ICD-10-CM | POA: Diagnosis not present

## 2020-07-26 DIAGNOSIS — J9612 Chronic respiratory failure with hypercapnia: Secondary | ICD-10-CM | POA: Diagnosis not present

## 2020-07-26 DIAGNOSIS — G894 Chronic pain syndrome: Secondary | ICD-10-CM | POA: Diagnosis not present

## 2020-07-26 DIAGNOSIS — I110 Hypertensive heart disease with heart failure: Secondary | ICD-10-CM | POA: Diagnosis not present

## 2020-07-26 DIAGNOSIS — M810 Age-related osteoporosis without current pathological fracture: Secondary | ICD-10-CM | POA: Diagnosis not present

## 2020-07-29 ENCOUNTER — Telehealth: Payer: Self-pay

## 2020-07-29 DIAGNOSIS — S2249XD Multiple fractures of ribs, unspecified side, subsequent encounter for fracture with routine healing: Secondary | ICD-10-CM | POA: Diagnosis not present

## 2020-07-29 DIAGNOSIS — I5032 Chronic diastolic (congestive) heart failure: Secondary | ICD-10-CM | POA: Diagnosis not present

## 2020-07-29 DIAGNOSIS — M810 Age-related osteoporosis without current pathological fracture: Secondary | ICD-10-CM | POA: Diagnosis not present

## 2020-07-29 DIAGNOSIS — M5431 Sciatica, right side: Secondary | ICD-10-CM | POA: Diagnosis not present

## 2020-07-29 DIAGNOSIS — J9612 Chronic respiratory failure with hypercapnia: Secondary | ICD-10-CM | POA: Diagnosis not present

## 2020-07-29 DIAGNOSIS — I110 Hypertensive heart disease with heart failure: Secondary | ICD-10-CM | POA: Diagnosis not present

## 2020-07-29 DIAGNOSIS — G894 Chronic pain syndrome: Secondary | ICD-10-CM | POA: Diagnosis not present

## 2020-07-29 DIAGNOSIS — J9611 Chronic respiratory failure with hypoxia: Secondary | ICD-10-CM | POA: Diagnosis not present

## 2020-07-29 DIAGNOSIS — M1990 Unspecified osteoarthritis, unspecified site: Secondary | ICD-10-CM | POA: Diagnosis not present

## 2020-07-29 DIAGNOSIS — J42 Unspecified chronic bronchitis: Secondary | ICD-10-CM | POA: Diagnosis not present

## 2020-07-29 NOTE — Telephone Encounter (Signed)
New message    The patient is asking for a chest x-ray order to be entered into the epic system  Reason: Swollen area below collarbone/ribs area

## 2020-07-30 ENCOUNTER — Encounter: Payer: Self-pay | Admitting: Internal Medicine

## 2020-07-30 NOTE — Telephone Encounter (Signed)
Spoke with pt and she has stated she made an appointment with an UC for tomorrow due to not hearing from anyone since this morning when she called.

## 2020-07-31 ENCOUNTER — Ambulatory Visit (HOSPITAL_COMMUNITY)
Admission: RE | Admit: 2020-07-31 | Discharge: 2020-07-31 | Disposition: A | Discharge status: Home / Self Care | Payer: Medicare HMO | Source: Ambulatory Visit | Attending: Family Medicine | Admitting: Family Medicine

## 2020-07-31 ENCOUNTER — Encounter (HOSPITAL_COMMUNITY): Payer: Self-pay

## 2020-07-31 ENCOUNTER — Other Ambulatory Visit: Payer: Self-pay

## 2020-07-31 ENCOUNTER — Ambulatory Visit (INDEPENDENT_AMBULATORY_CARE_PROVIDER_SITE_OTHER): Payer: Medicare HMO

## 2020-07-31 VITALS — BP 138/82 | HR 66 | Temp 97.9°F | Resp 16

## 2020-07-31 DIAGNOSIS — S2222XA Fracture of body of sternum, initial encounter for closed fracture: Secondary | ICD-10-CM | POA: Diagnosis not present

## 2020-07-31 DIAGNOSIS — Z8674 Personal history of sudden cardiac arrest: Secondary | ICD-10-CM | POA: Diagnosis not present

## 2020-07-31 DIAGNOSIS — R0789 Other chest pain: Secondary | ICD-10-CM

## 2020-07-31 DIAGNOSIS — L02415 Cutaneous abscess of right lower limb: Secondary | ICD-10-CM

## 2020-07-31 MED ORDER — DOXYCYCLINE HYCLATE 100 MG PO CAPS
100.0000 mg | ORAL_CAPSULE | Freq: Two times a day (BID) | ORAL | 0 refills | Status: DC
Start: 2020-07-31 — End: 2020-08-23

## 2020-07-31 NOTE — ED Triage Notes (Signed)
Pt presents to UC for sternum pain following CPR done 7/8. Pt states she feels a bump in her chest that was not there earlier. Pt states her home health nurse instrubed her to come in for x-ray. Pt states pain is worse with with palpation.   Pt also here for abscess on right hip. Pt states abscess is draining, but unsure if she needs abx.

## 2020-07-31 NOTE — Discharge Instructions (Addendum)
Sternal fractures do not commonly need surgery. Please schedule follow up with sports medicine for evaluation if you are not recovering over the next 8-12 weeks. Keep your scheduled appointments with your pain medicine specialist.  You may follow up here if you feel the abscess of your thigh is worsening. Start the antibiotic prescribed.

## 2020-08-01 ENCOUNTER — Telehealth: Payer: Self-pay | Admitting: Cardiovascular Disease

## 2020-08-01 DIAGNOSIS — S2222XG Fracture of body of sternum, subsequent encounter for fracture with delayed healing: Secondary | ICD-10-CM

## 2020-08-01 DIAGNOSIS — R079 Chest pain, unspecified: Secondary | ICD-10-CM

## 2020-08-01 NOTE — ED Provider Notes (Signed)
Denise King   [id number] 07/31/20 Arrival Time: 3762  ASSESSMENT & PLAN:  1. Sternal pain   2. Closed fracture of body of sternum, initial encounter   3. Abscess of right thigh     I have personally viewed the imaging studies ordered this visit. Sternal fracture.  No respiratory difficulties.   Discharge Instructions     Sternal fractures do not commonly need surgery. Please schedule follow up with sports medicine for evaluation if you are not recovering over the next 8-12 weeks. Keep your scheduled appointments with your pain medicine specialist.  You may follow up here if you feel the abscess of your thigh is worsening. Start the antibiotic prescribed.    Recommend:  Follow-up Information    [facility].   Why: If worsening or failing to improve as anticipated. Contact information: [place] [address] [place] on right thigh is draining. No need for I&D at this time. Begin: Meds ordered this encounter  Medications  . doxycycline (VIBRAMYCIN) 100 MG capsule    Sig: Take 1 capsule (100 mg total) by mouth 2 (two) times daily.    Dispense:  14 capsule    Refill:  0    Chest pain precautions given. Reviewed expectations re: course of current medical issues. Questions answered. Outlined signs and symptoms indicating need for more acute intervention. Patient verbalized understanding. After Visit Summary given.   SUBJECTIVE:  History from: patient. Denise King is a 69 y.o. female who presents with complaint of chest pain, more specifically sternal pain. Reports having CPR performed on 07/04/2020 by her daughter after her daughter felt that she could not wake her up after a nap. Chest pains since; worse with certain movements. No SOB or respiratory difficulties reported. Takes oxycodone for chronic pain; followed by pain specialist.   Also reports area of  erythema on R lateral thigh; several days; now draining; no bleeding; is tender; does not affect ambulation. Afebrile.   Social History   Tobacco Use  Smoking Status Current Every Day Smoker  . Packs/day: 1.00  . Years: 30.00  . Pack years: 30.00  . Types: Cigarettes  Smokeless Tobacco Never Used   Social History   Substance and Sexual Activity  Alcohol Use Yes  . Alcohol/week: 3.0 standard drinks  . Types: 3 Standard drinks or equivalent per week   Comment: occasional     OBJECTIVE:  Vitals:   07/31/20 1608  BP: 138/82  Pulse: 66  Resp: 16  Temp: 97.9 F (36.6 C)  TempSrc: Oral  SpO2: 95%    General appearance: alert, oriented, no acute distress Eyes: PERRLA; EOMI; conjunctivae normal HENT: normocephalic; atraumatic Neck: supple with FROM Lungs: without labored respirations; speaks full sentences without difficulty; CTAB Heart: regular Chest Wall: tender over sternum with small bony prominence Abdomen: soft, non-tender Extremities: without edema Skin: warm and dry; R lateral thigh with 1.5 cm area of erythema with central opening; slight purulent discharge; no bleeding; is TTP Neuro: normal gait Psychological: alert and cooperative; normal mood and affect  Imaging: DG Sternum  Result Date: 07/31/2020 CLINICAL DATA:  Status post CPR. EXAM: STERNUM - 2+ VIEW COMPARISON:  July 08, 2020 FINDINGS: An acute fracture deformity is seen involving the mid to upper portion of the body of the sternum. Approximately 5 mm anterior displacement of the inferior aspect of the body of the  sternum is seen. An additional fracture deformity of indeterminate age is noted involving the lower portion of the body of the sternum. Radiopaque surgical clips are seen within the abdomen on the lateral view. Bilateral radiopaque pedicle screws are seen within the visualized portion of the lumbar spine. IMPRESSION: 1. Acute fracture of the mid to upper body of the sternum. 2. Age-indeterminate  fracture of the lower portion of the body of the sternum. Electronically Signed   By: Virgina Norfolk M.D.   On: 07/31/2020 16:49     Allergies  Allergen Reactions  . Atorvastatin Nausea And Vomiting  . Levofloxacin Other (See Comments)    Reaction: achilles tendon pain  . Omnicef [Cefdinir] Diarrhea    Uncontrollable diarrhea  . Trazodone And Nefazodone Other (See Comments)    "Felt like I was going to faint"  . Sulfa Antibiotics Rash  . Sulfonamide Derivatives Rash    Past Medical History:  Diagnosis Date  . Acute cystitis   . Acute respiratory failure ([place] [facility])   . Allergy    as a child grew out of them  . Anemia    pernicious anemia  . Anxiety   . Arthritis   . Back pain   . Blood transfusion   . CAP (community acquired pneumonia)   . CHF (congestive heart failure) ([place])   . Chronic pain syndrome   . Common bile duct stone   . Depression   . Diverticulosis of colon   . Duodenal ulcer hemorrhagic-after biliary sphincterotomy   . Dysthymia   . Esophagitis   . EtOH dependence ([place])   . Gastritis   . GERD (gastroesophageal reflux disease)   . H/O chest pain Dec. 2013   no work up done  . Hx of colonic polyps   . Hypertension   . Irritable bowel syndrome   . Lymphocytic colitis 02/16/2020  . Osteopenia   . Osteoporosis   . Pneumonia 2019  . Polypharmacy   . Reflux esophagitis   . Right sided sciatica 12/22/2017  . Tobacco use disorder   . Unspecified chronic bronchitis ([place])   . Vertigo   . Vitamin B12 deficiency   . Wears glasses    Social History   Socioeconomic History  . Marital status: Married    Spouse name: Not on file  . Number of children: 4  . Years of education: 9  . Highest education level: Not on file  Occupational History  . Occupation: retired    Comment: disablility  Tobacco Use  . Smoking status: Current Every Day Smoker    Packs/day: 1.00    Years: 30.00    Pack years: 30.00    Types: Cigarettes  . Smokeless tobacco: Never Used   Vaping Use  . Vaping Use: Never used  Substance and Sexual Activity  . Alcohol use: Yes    Alcohol/week: 3.0 standard drinks    Types: 3 Standard drinks or equivalent per week    Comment: occasional  . Drug use: No  . Sexual activity: Yes  Other Topics Concern  . Not on file  Social History Narrative   Married x 38 yrs. Has BS degree in Horticulture from [employer]. Currently resides in a house with her husband. 3 cats.  Fun: used to like to do a lot of things.    Denies religious beliefs that would effect health care.    lives with husband [name]; quit smoking- October 6th, 2019. ocasional alcohol. [employer] [2009] on disability sec to back issues.  Social Determinants of Health   Financial Resource Strain:   . Difficulty of Paying Living Expenses:   Food Insecurity:   . Worried About Charity fundraiser in the Last Year:   . Arboriculturist in the Last Year:   Transportation Needs: No Transportation Needs  . Lack of Transportation (Medical): No  . Lack of Transportation (Non-Medical): No  Physical Activity:   . Days of Exercise per Week:   . Minutes of Exercise per Session:   Stress:   . Feeling of Stress :   Social Connections: Moderately Isolated  . Frequency of Communication with Friends and Family: More than three times a week  . Frequency of Social Gatherings with Friends and Family: Once a week  . Attends Religious Services: Never  . Active Member of Clubs or Organizations: No  . Attends Archivist Meetings: Never  . Marital Status: Married  Human resources officer Violence:   . Fear of Current or Ex-Partner:   . Emotionally Abused:   Marland Kitchen Physically Abused:   . Sexually Abused:    Family History  Problem Relation Age of Onset  . Colon cancer Father   . Prostate cancer Father   . Heart disease Mother        prev MVR, also has DJD  . Colon cancer Brother   . Skin cancer Sister   . Alcohol abuse Daughter   . Bipolar disorder Daughter   .  Drug abuse Daughter   . Esophageal cancer Neg Hx   . Rectal cancer Neg Hx   . Stomach cancer Neg Hx    Past Surgical History:  Procedure Laterality Date  . APPENDECTOMY    . BACK SURGERY  10-09   Dr. Patrice Paradise  . BIOPSY  02/14/2020   Procedure: BIOPSY;  Surgeon: Gatha Mayer, MD;  Location: [facility];  Service: Endoscopy;;  . CARPAL TUNNEL RELEASE Right 08/14/2014   Procedure: RIGHT CARPAL TUNNEL RELEASE AND INJECT LEFT THUMB;  Surgeon: Daryll Brod, MD;  Location: [facility];  Service: Orthopedics;  Laterality: Right;  . CHOLECYSTECTOMY N/A 02/22/2020   Procedure: LAPAROSCOPIC CHOLECYSTECTOMY WITH INTRAOPERATIVE CHOLANGIOGRAM;  Surgeon: Kieth Brightly Arta Bruce, MD;  Location: [facility];  Service: General;  Laterality: N/A;  . COLONOSCOPY    . COLONOSCOPY WITH PROPOFOL N/A 02/14/2020   Procedure: COLONOSCOPY WITH PROPOFOL;  Surgeon: Gatha Mayer, MD;  Location: [facility];  Service: Endoscopy;  Laterality: N/A;  . ENDOSCOPIC RETROGRADE CHOLANGIOPANCREATOGRAPHY (ERCP) WITH PROPOFOL N/A 12/25/2019   Procedure: ENDOSCOPIC RETROGRADE CHOLANGIOPANCREATOGRAPHY (ERCP) WITH PROPOFOL;  Surgeon: Gatha Mayer, MD;  Location: [facility];  Service: Gastroenterology;  Laterality: N/A;  . ESOPHAGOGASTRODUODENOSCOPY (EGD) WITH PROPOFOL N/A 01/06/2020   Procedure: ESOPHAGOGASTRODUODENOSCOPY (EGD) WITH PROPOFOL;  Surgeon: Irene Shipper, MD;  Location[facility] END[facility];  Service: Endoscopy;  Laterality: N/A;  . HOT HEMOSTASIS N/A 01/06/2020   Procedure: HOT HEMOSTASIS (ARGON PLASMA COAGULATION/BICAP);  Surgeon: Irene Shipper, MD;  Location: [facility] ENDOSCOPY;  Service: Endoscopy;  Laterality: N/A;  . INTRAMEDULLARY (IM) NAIL INTERTROCHANTERIC Right 10/01/2018   Procedure: INTRAMEDULLARY (IM) NAIL INTERTROCHANTRIC;  Surgeon: Thornton Park, MD;  Location: [facility]S;  Service: Orthopedics;  Laterality: Right;  . LAPAROSCOPY N/A 02/21/2013   Procedure: LAPAROSCOPY OPERATIVE;  Surgeon: Margarette Asal, MD;   Location: [facility];  Service: Gynecology;  Laterality: N/A;  REQUESTING 5MM SCOPE WITH CAMERA  . LEFT HEART CATH AND CORONARY ANGIOGRAPHY N/A 05/11/2019   Procedure: LEFT HEART CATH AND CORONARY ANGIOGRAPHY;  Surgeon:  Sherren Mocha, MD;  Location: [facility];  Service: Cardiovascular;  Laterality: N/A;  . REMOVAL OF STONES  12/25/2019   Procedure: Karlyn Agee;  Surgeon: Gatha Mayer, MD;  Location: [facility] ENDOSCOPY;  Service: Gastroenterology;;  . Clide Deutscher  01/06/2020   Procedure: Clide Deutscher;  Surgeon: Irene Shipper, MD;  Location: [facility] ENDOSCOPY;  Service: Endoscopy;;  . Joan Mayans  12/25/2019   Procedure: SPHINCTEROTOMY;  Surgeon: Gatha Mayer, MD;  Location: [facility];  Service: Gastroenterology;;  . TONSILLECTOMY    . TUBAL LIGATION    . UPPER GASTROINTESTINAL ENDOSCOPY       Vanessa Kick, MD 08/01/20 1212

## 2020-08-01 NOTE — Telephone Encounter (Signed)
Called patient back. Patient stated she wanted Dr. Johnsie Cancel to know that her chest xray showed that her sternum is broken and if there is anything she can do to ease the pain. After reviewing patient's AVS from hospital, encouraged patient to call [facility] that was advised.  Informed patient that Dr. Johnsie Cancel is not in the office this week, but her message would be sent to him for further advisement. Patient verbalized understanding and agreed to call Sports Medicine.

## 2020-08-01 NOTE — Telephone Encounter (Signed)
    Pt said she had an x-ray yesterday and it shows her sternum is broken, she wanted to let Dr. Johnsie Cancel know and ask if there's anything she can do to ease the pain.

## 2020-08-03 DIAGNOSIS — I509 Heart failure, unspecified: Secondary | ICD-10-CM | POA: Diagnosis not present

## 2020-08-05 DIAGNOSIS — I110 Hypertensive heart disease with heart failure: Secondary | ICD-10-CM | POA: Diagnosis not present

## 2020-08-05 DIAGNOSIS — M5431 Sciatica, right side: Secondary | ICD-10-CM | POA: Diagnosis not present

## 2020-08-05 DIAGNOSIS — M1990 Unspecified osteoarthritis, unspecified site: Secondary | ICD-10-CM | POA: Diagnosis not present

## 2020-08-05 DIAGNOSIS — M810 Age-related osteoporosis without current pathological fracture: Secondary | ICD-10-CM | POA: Diagnosis not present

## 2020-08-05 DIAGNOSIS — I5032 Chronic diastolic (congestive) heart failure: Secondary | ICD-10-CM | POA: Diagnosis not present

## 2020-08-05 DIAGNOSIS — J9611 Chronic respiratory failure with hypoxia: Secondary | ICD-10-CM | POA: Diagnosis not present

## 2020-08-05 DIAGNOSIS — J42 Unspecified chronic bronchitis: Secondary | ICD-10-CM | POA: Diagnosis not present

## 2020-08-05 DIAGNOSIS — J9612 Chronic respiratory failure with hypercapnia: Secondary | ICD-10-CM | POA: Diagnosis not present

## 2020-08-05 DIAGNOSIS — G894 Chronic pain syndrome: Secondary | ICD-10-CM | POA: Diagnosis not present

## 2020-08-05 DIAGNOSIS — S2249XD Multiple fractures of ribs, unspecified side, subsequent encounter for fracture with routine healing: Secondary | ICD-10-CM | POA: Diagnosis not present

## 2020-08-05 NOTE — Telephone Encounter (Signed)
Can refer to [facility] for opinion on sternal fracture

## 2020-08-05 NOTE — Telephone Encounter (Signed)
Called patient back with Dr. Kyla Balzarine recommendation. Patient verbalized understanding and will look forward to seeing [facility]. Referral placed.

## 2020-08-06 DIAGNOSIS — M25551 Pain in right hip: Secondary | ICD-10-CM | POA: Diagnosis not present

## 2020-08-06 DIAGNOSIS — R0789 Other chest pain: Secondary | ICD-10-CM | POA: Diagnosis not present

## 2020-08-06 DIAGNOSIS — Z79891 Long term (current) use of opiate analgesic: Secondary | ICD-10-CM | POA: Diagnosis not present

## 2020-08-06 DIAGNOSIS — G894 Chronic pain syndrome: Secondary | ICD-10-CM | POA: Diagnosis not present

## 2020-08-07 ENCOUNTER — Telehealth: Payer: Self-pay

## 2020-08-07 DIAGNOSIS — S2220XG Unspecified fracture of sternum, subsequent encounter for fracture with delayed healing: Secondary | ICD-10-CM

## 2020-08-07 DIAGNOSIS — G894 Chronic pain syndrome: Secondary | ICD-10-CM | POA: Diagnosis not present

## 2020-08-07 DIAGNOSIS — M5431 Sciatica, right side: Secondary | ICD-10-CM | POA: Diagnosis not present

## 2020-08-07 DIAGNOSIS — J42 Unspecified chronic bronchitis: Secondary | ICD-10-CM | POA: Diagnosis not present

## 2020-08-07 DIAGNOSIS — M810 Age-related osteoporosis without current pathological fracture: Secondary | ICD-10-CM | POA: Diagnosis not present

## 2020-08-07 DIAGNOSIS — S2249XD Multiple fractures of ribs, unspecified side, subsequent encounter for fracture with routine healing: Secondary | ICD-10-CM | POA: Diagnosis not present

## 2020-08-07 DIAGNOSIS — J9612 Chronic respiratory failure with hypercapnia: Secondary | ICD-10-CM | POA: Diagnosis not present

## 2020-08-07 DIAGNOSIS — I5032 Chronic diastolic (congestive) heart failure: Secondary | ICD-10-CM | POA: Diagnosis not present

## 2020-08-07 DIAGNOSIS — I110 Hypertensive heart disease with heart failure: Secondary | ICD-10-CM | POA: Diagnosis not present

## 2020-08-07 DIAGNOSIS — M1990 Unspecified osteoarthritis, unspecified site: Secondary | ICD-10-CM | POA: Diagnosis not present

## 2020-08-07 DIAGNOSIS — J9611 Chronic respiratory failure with hypoxia: Secondary | ICD-10-CM | POA: Diagnosis not present

## 2020-08-07 NOTE — Telephone Encounter (Signed)
Ordered CT for patient and sent her a mychart message, that someone will be calling her to schedule it.

## 2020-08-07 NOTE — Telephone Encounter (Signed)
-----   Message from Josue Hector, MD sent at 08/07/2020  1:00 PM EDT ----- That's fine can order a non contrast chest CT for sternal fracture ----- Message ----- From: Michaelyn Barter, RN Sent: 08/07/2020  12:28 PM EDT To: Josue Hector, MD  Dr. Johnsie Cancel,  Would you like to order a CT of the Chest? Patient just has a chest xray from the ED.  Thank you, Pam ----- Message ----- From: Ned Clines Sent: 08/07/2020  11:57 AM EDT To: Michaelyn Barter, RN  [facility] received a referral for this patient and we need images to look at.  Can you get a CT Chest scheduled before we schedule an appointment at our office.  We need updated images.  Thanks  [employer]

## 2020-08-09 ENCOUNTER — Ambulatory Visit (INDEPENDENT_AMBULATORY_CARE_PROVIDER_SITE_OTHER)
Admission: RE | Admit: 2020-08-09 | Discharge: 2020-08-09 | Disposition: A | Discharge status: Home / Self Care | Payer: Medicare HMO | Source: Ambulatory Visit | Attending: Cardiovascular Disease | Admitting: Cardiovascular Disease

## 2020-08-09 ENCOUNTER — Other Ambulatory Visit: Payer: Self-pay

## 2020-08-09 DIAGNOSIS — I509 Heart failure, unspecified: Secondary | ICD-10-CM | POA: Diagnosis not present

## 2020-08-09 DIAGNOSIS — S2220XD Unspecified fracture of sternum, subsequent encounter for fracture with routine healing: Secondary | ICD-10-CM | POA: Diagnosis not present

## 2020-08-09 DIAGNOSIS — J432 Centrilobular emphysema: Secondary | ICD-10-CM | POA: Diagnosis not present

## 2020-08-09 DIAGNOSIS — I2510 Atherosclerotic heart disease of native coronary artery without angina pectoris: Secondary | ICD-10-CM | POA: Diagnosis not present

## 2020-08-09 DIAGNOSIS — S2220XG Unspecified fracture of sternum, subsequent encounter for fracture with delayed healing: Secondary | ICD-10-CM

## 2020-08-09 DIAGNOSIS — I700 Atherosclerosis of aorta: Secondary | ICD-10-CM | POA: Diagnosis not present

## 2020-08-11 ENCOUNTER — Encounter: Payer: Self-pay | Admitting: Internal Medicine

## 2020-08-11 NOTE — Assessment & Plan Note (Addendum)
Stable, cont current med tx, for labs as ordered, recent labs unresulted reviewed, and f/u cardiology as planned, PFTs ordered

## 2020-08-11 NOTE — Progress Notes (Signed)
Subjective:    Patient ID: Denise King, female    DOB: Nov 08, 1951, 69 y.o.   MRN: [id number]  HPI   Here to f/u recent hospn 7/8 - 7/14 for acute/chronic CHF requiring Bipap, diuresed with IV lasix, and able for d/c home again on 2L home o2 (same as prior).  She also had intermittent hypokalemia this hospitalization which was replenished. She was also started on prednisone for 4 days for possible copd component. She was felt  stable for discharge, although due to weakness that she still complains, she was offered discharge to SNF but she declined that. In the past, she was taken off of lasix by her cardiologist due to persistent hypokalemia so she is being discharged back on her kcl and aldactone and will f/u with her cardiologist at 1-2 wks.  Today was recommended for f/u bmp/cbc and f/u hospital unresulted labs from 7/10-15 inclduing BNP, BMP, weekly cr on lovenox, as well as need for PFTs.  Today, Pt denies chest pain, increased sob or doe, wheezing, orthopnea, PND, increased LE swelling, palpitations, dizziness or syncope.  Pt denies new neurological symptoms such as new headache, or facial or extremity weakness or numbness   Pt denies polydipsia, polyuria, Denies worsening depressive symptoms, suicidal ideation, or panic; has ongoing anxiety, mild only recently and pt is wanting to get off as many meds as possible, and asks to d/c the xanax and wellbutrin.   Also:  Transitional Care Management elements:  1)  Date of D/C: as above 2)  Medication reconciliation:  done today at end visit 3)  Review of D/C summary or other information:  done today 4)  Review of need for f/u on pending diagnostic tests and treatments:  done today 5)  Review of need for Interaction with other providers who will assume or resume care of pt specific problems: done today 6)  Education of patient/family/guardian or caregiver: done today 7)  Establishment or Re-establishment of referrals and arranging for needed  community resources:  done today 8)  Assistance in scheduling any required follow up with community providers and services:  done today Past Medical History:  Diagnosis Date  . Acute cystitis   . Acute respiratory failure ([place])   . Allergy    as a child grew out of them  . Anemia    pernicious anemia  . Anxiety   . Arthritis   . Back pain   . Blood transfusion   . CAP (community acquired pneumonia)   . CHF (congestive heart failure) ([place])   . Chronic pain syndrome   . Common bile duct stone   . Depression   . Diverticulosis of colon   . Duodenal ulcer hemorrhagic-after biliary sphincterotomy   . Dysthymia   . Esophagitis   . EtOH dependence ([place])   . Gastritis   . GERD (gastroesophageal reflux disease)   . H/O chest pain Dec. 2013   no work up done  . Hx of colonic polyps   . Hypertension   . Irritable bowel syndrome   . Lymphocytic colitis 02/16/2020  . Osteopenia   . Osteoporosis   . Pneumonia 2019  . Polypharmacy   . Reflux esophagitis   . Right sided sciatica 12/22/2017  . Tobacco use disorder   . Unspecified chronic bronchitis ([place])   . Vertigo   . Vitamin B12 deficiency   . Wears glasses    Past Surgical History:  Procedure Laterality Date  . APPENDECTOMY    . BACK SURGERY  10-09   Dr. Patrice Paradise  . BIOPSY  02/14/2020   Procedure: BIOPSY;  Surgeon: Gatha Mayer, MD;  Location: [facility];  Service: Endoscopy;;  . CARPAL TUNNEL RELEASE Right 08/14/2014   Procedure: RIGHT CARPAL TUNNEL RELEASE AND INJECT LEFT THUMB;  Surgeon: Daryll Brod, MD;  Location: [facility];  Service: Orthopedics;  Laterality: Right;  . CHOLECYSTECTOMY N/A 02/22/2020   Procedure: LAPAROSCOPIC CHOLECYSTECTOMY WITH INTRAOPERATIVE CHOLANGIOGRAM;  Surgeon: Kieth Brightly Arta Bruce, MD;  Location: [facility];  Service: General;  Laterality: N/A;  . COLONOSCOPY    . COLONOSCOPY WITH PROPOFOL N/A 02/14/2020   Procedure: COLONOSCOPY WITH PROPOFOL;  Surgeon: Gatha Mayer, MD;  Location:  [facility];  Service: Endoscopy;  Laterality: N/A;  . ENDOSCOPIC RETROGRADE CHOLANGIOPANCREATOGRAPHY (ERCP) WITH PROPOFOL N/A 12/25/2019   Procedure: ENDOSCOPIC RETROGRADE CHOLANGIOPANCREATOGRAPHY (ERCP) WITH PROPOFOL;  Surgeon: Gatha Mayer, MD;  Location: [facility];  Service: Gastroenterology;  Laterality: N/A;  . ESOPHAGOGASTRODUODENOSCOPY (EGD) WITH PROPOFOL N/A 01/06/2020   Procedure: ESOPHAGOGASTRODUODENOSCOPY (EGD) WITH PROPOFOL;  Surgeon: Irene Shipper, MD;  Location: [facility];  Service: Endoscopy;  Laterality: N/A;  . HOT HEMOSTASIS N/A 01/06/2020   Procedure: HOT HEMOSTASIS (ARGON PLASMA COAGULATION/BICAP);  Surgeon: Irene Shipper, MD;  Location[facility];  Service: Endoscopy;  Laterality: N/A;  . INTRAMEDULLARY (IM) NAIL INTERTROCHANTERIC Right 10/01/2018   Procedure: INTRAMEDULLARY (IM) NAIL INTERTROCHANTRIC;  Surgeon: Thornton Park, MD;  Location: [facility];  Service: Orthopedics;  Laterality: Right;  . LAPAROSCOPY N/A 02/21/2013   Procedure: LAPAROSCOPY OPERATIVE;  Surgeon: Margarette Asal, MD;  Location: [facility];  Service: Gynecology;  Laterality: N/A;  REQUESTING 5MM SCOPE WITH CAMERA  . LEFT HEART CATH AND CORONARY ANGIOGRAPHY N/A 05/11/2019   Procedure: LEFT HEART CATH AND CORONARY ANGIOGRAPHY;  Surgeon: Sherren Mocha, MD;  Location: [facility];  Service: Cardiovascular;  Laterality: N/A;  . REMOVAL OF STONES  12/25/2019   Procedure: Karlyn Agee;  Surgeon: Gatha Mayer, MD;  Location: [facility] ENDOSCOPY;  Service: Gastroenterology;;  . Clide Deutscher  01/06/2020   Procedure: Clide Deutscher;  Surgeon: Irene Shipper, MD;  Location: [facility]COPY;  Service: Endoscopy;;  . Joan Mayans  12/25/2019   Procedure: SPHINCTEROTOMY;  Surgeon: Gatha Mayer, MD;  Location: [facility];  Service: Gastroenterology;;  . TONSILLECTOMY    . TUBAL LIGATION    . UPPER GASTROINTESTINAL ENDOSCOPY      reports that she has been smoking cigarettes. She has a 30.00 pack-year  smoking history. She has never used smokeless tobacco. She reports current alcohol use of about 3.0 standard drinks of alcohol per week. She reports that she does not use drugs. family history includes Alcohol abuse in her daughter; Bipolar disorder in her daughter; Colon cancer in her brother and father; Drug abuse in her daughter; Heart disease in her mother; Prostate cancer in her father; Skin cancer in her sister. Allergies  Allergen Reactions  . Atorvastatin Nausea And Vomiting  . Levofloxacin Other (See Comments)    Reaction: achilles tendon pain  . Omnicef [Cefdinir] Diarrhea    Uncontrollable diarrhea  . Trazodone And Nefazodone Other (See Comments)    "Felt like I was going to faint"  . Sulfa Antibiotics Rash  . Sulfonamide Derivatives Rash   Current Outpatient Medications on File Prior to Visit  Medication Sig Dispense Refill  . ARIPiprazole (ABILIFY) 2 MG tablet Take 1 tablet (2 mg total) by mouth daily. 90 tablet 0  . aspirin EC 81 MG tablet Take 1 tablet (81 mg  total) by mouth daily.    . budesonide (ENTOCORT EC) 3 MG 24 hr capsule TAKE 3 CAPSULES ONCE DAILY. (Patient taking differently: Take 9 mg by mouth daily. ) 90 capsule 1  . busPIRone (BUSPAR) 15 MG tablet Take 1 tablet (15 mg total) by mouth 2 (two) times daily. 180 tablet 0  . Cholecalciferol (VITAMIN D) 50 MCG (2000 UT) tablet Take 2,000 Units by mouth daily after breakfast.     . denosumab (PROLIA) 60 MG/ML SOSY injection Inject 60 mg into the skin every 6 (six) months.    . folic acid (FOLVITE) 1 MG tablet Take 1 tablet (1 mg total) by mouth daily. (Patient taking differently: Take 1 mg by mouth daily after breakfast. ) 90 tablet 3  . gabapentin (NEURONTIN) 300 MG capsule Take 300 mg by mouth 3 (three) times daily.    . hyoscyamine (ANASPAZ) 0.125 MG TBDP disintergrating tablet Place 0.125 mg under the tongue every 6 (six) hours as needed ([name] cramps).     . morphine (MS CONTIN) 15 MG 12 hr tablet Take 15 mg by mouth  3 (three) times daily.    . Multiple Vitamin (MULTIVITAMIN WITH MINERALS) TABS tablet Take 1 tablet by mouth daily. 30 tablet 0  . nicotine (NICODERM CQ - DOSED IN MG/24 HOURS) 21 mg/24hr patch Place 1 patch (21 mg total) onto the skin daily. 28 patch 0  . nitroGLYCERIN (NITROSTAT) 0.4 MG SL tablet Place 1 tablet (0.4 mg total) under the tongue every 5 (five) minutes as needed for chest pain. 25 tablet 3  . ondansetron (ZOFRAN-ODT) 4 MG disintegrating tablet Take 4 mg by mouth every 8 (eight) hours as needed for nausea or vomiting.    . Oxycodone HCl 10 MG TABS Take 10 mg by mouth 4 (four) times daily.     . pantoprazole (PROTONIX) 40 MG tablet TAKE (1) TABLET TWICE A DAY BEFORE MEALS. (Patient taking differently: Take 40 mg by mouth 2 (two) times daily with a meal. ) 60 tablet 4  . potassium chloride (MICRO-K) 10 MEQ CR capsule Take 10 mEq by mouth 2 (two) times daily.    . rosuvastatin (CRESTOR) 5 MG tablet Take 1 tablet (5 mg total) by mouth every Monday, Wednesday, and Friday at 6 PM. 36 tablet 3  . sertraline (ZOLOFT) 100 MG tablet Take 2 tablets (200 mg total) by mouth at bedtime. 180 tablet 0  . spironolactone (ALDACTONE) 25 MG tablet Take 0.5 tablets (12.5 mg total) by mouth daily. 45 tablet 3  . valACYclovir (VALTREX) 500 MG tablet Take 500 mg by mouth 2 (two) times daily as needed (flare ups).     . vitamin B-12 (CYANOCOBALAMIN) 100 MCG tablet Take 1 tablet (100 mcg total) by mouth daily. (Patient taking differently: Take 100 mcg by mouth daily after breakfast. ) 90 tablet 3  . Diphenoxylate-Atropine (LOMOTIL PO) Lomotil  1 po qid prn    . furosemide (LASIX) 20 MG tablet furosemide 20 mg tablet    . sucralfate (CARAFATE) 1 g tablet sucralfate 1 gram tablet     No current facility-administered medications on file prior to visit.   Review of Systems All otherwise neg per pt     Objective:   Physical Exam BP 120/80 (BP Location: Left Arm, Patient Position: Sitting, Cuff Size: Large)    Pulse 84   Temp 97.9 F (36.6 C) (Oral)   Ht 5\' 1"  (1.549 m)   Wt 109 lb (49.4 kg)   SpO2 97%  BMI 20.60 kg/m  VS noted,  Constitutional: Pt appears in NAD HENT: Head: NCAT.  Right Ear: External ear normal.  Left Ear: External ear normal.  Eyes: . Pupils are equal, round, and reactive to light. Conjunctivae and EOM are normal Nose: without d/c or deformity Neck: Neck supple. Gross normal ROM Cardiovascular: Normal rate and regular rhythm.   Pulmonary/Chest: Effort normal and breath sounds without rales or wheezing.  Abd:  Soft, NT, ND, + BS, no organomegaly Neurological: Pt is alert. At baseline orientation, motor grossly intact Skin: Skin is warm. No rashes, other new lesions, no LE edema Psychiatric: Pt behavior is normal without agitation  All otherwise neg per pt     Assessment & Plan:

## 2020-08-11 NOTE — Assessment & Plan Note (Signed)
[place] for d/x xanax and wellbutrin, declines psychiatry f/u for now

## 2020-08-12 ENCOUNTER — Telehealth: Payer: Self-pay

## 2020-08-12 DIAGNOSIS — R918 Other nonspecific abnormal finding of lung field: Secondary | ICD-10-CM

## 2020-08-12 DIAGNOSIS — R9389 Abnormal findings on diagnostic imaging of other specified body structures: Secondary | ICD-10-CM

## 2020-08-12 NOTE — Telephone Encounter (Signed)
Patient aware of results. Will place order for follow up CT.

## 2020-08-12 NOTE — Telephone Encounter (Signed)
-----   Message from Josue Hector, MD sent at 08/12/2020  8:01 AM EDT ----- COPD needs f/u CT 3 months see report should have had referral to [facility] for sternal fracture

## 2020-08-13 DIAGNOSIS — G894 Chronic pain syndrome: Secondary | ICD-10-CM | POA: Diagnosis not present

## 2020-08-13 DIAGNOSIS — J42 Unspecified chronic bronchitis: Secondary | ICD-10-CM | POA: Diagnosis not present

## 2020-08-13 DIAGNOSIS — J9611 Chronic respiratory failure with hypoxia: Secondary | ICD-10-CM | POA: Diagnosis not present

## 2020-08-13 DIAGNOSIS — I110 Hypertensive heart disease with heart failure: Secondary | ICD-10-CM | POA: Diagnosis not present

## 2020-08-13 DIAGNOSIS — M810 Age-related osteoporosis without current pathological fracture: Secondary | ICD-10-CM | POA: Diagnosis not present

## 2020-08-13 DIAGNOSIS — M5431 Sciatica, right side: Secondary | ICD-10-CM | POA: Diagnosis not present

## 2020-08-13 DIAGNOSIS — I5032 Chronic diastolic (congestive) heart failure: Secondary | ICD-10-CM | POA: Diagnosis not present

## 2020-08-13 DIAGNOSIS — J9612 Chronic respiratory failure with hypercapnia: Secondary | ICD-10-CM | POA: Diagnosis not present

## 2020-08-13 DIAGNOSIS — S2249XD Multiple fractures of ribs, unspecified side, subsequent encounter for fracture with routine healing: Secondary | ICD-10-CM | POA: Diagnosis not present

## 2020-08-13 DIAGNOSIS — M1990 Unspecified osteoarthritis, unspecified site: Secondary | ICD-10-CM | POA: Diagnosis not present

## 2020-08-19 ENCOUNTER — Telehealth: Payer: Self-pay | Admitting: Cardiovascular Disease

## 2020-08-19 ENCOUNTER — Other Ambulatory Visit: Payer: Self-pay

## 2020-08-19 ENCOUNTER — Telehealth (INDEPENDENT_AMBULATORY_CARE_PROVIDER_SITE_OTHER): Payer: Medicare HMO | Admitting: Psychiatry

## 2020-08-19 DIAGNOSIS — M5431 Sciatica, right side: Secondary | ICD-10-CM | POA: Diagnosis not present

## 2020-08-19 DIAGNOSIS — M1990 Unspecified osteoarthritis, unspecified site: Secondary | ICD-10-CM | POA: Diagnosis not present

## 2020-08-19 DIAGNOSIS — I5032 Chronic diastolic (congestive) heart failure: Secondary | ICD-10-CM | POA: Diagnosis not present

## 2020-08-19 DIAGNOSIS — F411 Generalized anxiety disorder: Secondary | ICD-10-CM | POA: Diagnosis not present

## 2020-08-19 DIAGNOSIS — S2249XD Multiple fractures of ribs, unspecified side, subsequent encounter for fracture with routine healing: Secondary | ICD-10-CM | POA: Diagnosis not present

## 2020-08-19 DIAGNOSIS — J9611 Chronic respiratory failure with hypoxia: Secondary | ICD-10-CM | POA: Diagnosis not present

## 2020-08-19 DIAGNOSIS — I110 Hypertensive heart disease with heart failure: Secondary | ICD-10-CM | POA: Diagnosis not present

## 2020-08-19 DIAGNOSIS — F341 Dysthymic disorder: Secondary | ICD-10-CM | POA: Diagnosis not present

## 2020-08-19 DIAGNOSIS — R69 Illness, unspecified: Secondary | ICD-10-CM | POA: Diagnosis not present

## 2020-08-19 DIAGNOSIS — J42 Unspecified chronic bronchitis: Secondary | ICD-10-CM | POA: Diagnosis not present

## 2020-08-19 DIAGNOSIS — M810 Age-related osteoporosis without current pathological fracture: Secondary | ICD-10-CM | POA: Diagnosis not present

## 2020-08-19 DIAGNOSIS — J9612 Chronic respiratory failure with hypercapnia: Secondary | ICD-10-CM | POA: Diagnosis not present

## 2020-08-19 DIAGNOSIS — G894 Chronic pain syndrome: Secondary | ICD-10-CM | POA: Diagnosis not present

## 2020-08-19 MED ORDER — SERTRALINE HCL 100 MG PO TABS: 200.0000 mg | ORAL_TABLET | Freq: Every day | ORAL | 0 refills | Status: DC

## 2020-08-19 MED ORDER — BUPROPION HCL ER (SR) 150 MG PO TB12: 150.0000 mg | ORAL_TABLET | Freq: Every day | ORAL | 0 refills | Status: DC

## 2020-08-19 NOTE — Telephone Encounter (Signed)
Denise King returning call back

## 2020-08-19 NOTE — Telephone Encounter (Signed)
New messagr:    Denise King from [facility] calling concering this patient medication. please call back at [phone]

## 2020-08-19 NOTE — Progress Notes (Signed)
BH MD/PA/NP OP Progress Note  08/19/2020 2:48 PM DURENDA PECHACEK  MRN:  [id number] Interview was conducted by phone and I verified that I was speaking with the correct person using two identifiers. I discussed the limitations of evaluation and management by telemedicine and  the availability of in person appointments. Patient expressed understanding and agreed to proceed. Patient location - home; physician - home office.  Chief Complaint: Anxiety, fatigue, pain.  HPI: 69yo female with chronic anxiety/depression which worsened with increased physical problems/pain. She has been for a long time on Cymbalta - we have cross tapered it to sertraline which she toleratedwell and dose has been increased to 150 mg as she continued to reports feeling anxious and depressed.She was taken off clonazepam out of concern about combining benzodiazepines with narcotic analgesics and instead buspirone was added. She finds itnot very effective but continues to take it.It is prescribed tid but [name] takes it bid.We have added a low dose of aripiprazole to augment sertraline and [name] reports that her mood started to improve.While in the hospital in December she was taken off hydroxyzine - she takes melatonin now and her sleep is adequate.She was restarted on alprazolam low dose as needed for anxiety in March (by GI) but her pain specialist has been concerned about likely interaction with opioid analgesics she is on. [name] reports falling few weeks ago, broke sternum and several of her medications were stopped at that time (including alprazolam, buspirone, aripiprazole and bupropion). She is complaining of  sternal pain and will see a surgeon soon. She is on oxycodone and MS Contin. She continues to report feeling tired and low on drive/motivation.  Visit Diagnosis:    ICD-10-CM   1. Generalized anxiety disorder  F41.1   2. DYSTHYMIA  F34.1     Past Psychiatric History: Please see intake H&P.  Past  Medical History:  Past Medical History:  Diagnosis Date  . Acute cystitis   . Acute respiratory failure ([place])   . Allergy    as a child grew out of them  . Anemia    pernicious anemia  . Anxiety   . Arthritis   . Back pain   . Blood transfusion   . CAP (community acquired pneumonia)   . CHF (congestive heart failure) ([place])   . Chronic pain syndrome   . Common bile duct stone   . Depression   . Diverticulosis of colon   . Duodenal ulcer hemorrhagic-after biliary sphincterotomy   . Dysthymia   . Esophagitis   . EtOH dependence ([place])   . Gastritis   . GERD (gastroesophageal reflux disease)   . H/O chest pain Dec. 2013   no work up done  . Hx of colonic polyps   . Hypertension   . Irritable bowel syndrome   . Lymphocytic colitis 02/16/2020  . Osteopenia   . Osteoporosis   . Pneumonia 2019  . Polypharmacy   . Reflux esophagitis   . Right sided sciatica 12/22/2017  . Tobacco use disorder   . Unspecified chronic bronchitis ([place])   . Vertigo   . Vitamin B12 deficiency   . Wears glasses     Past Surgical History:  Procedure Laterality Date  . APPENDECTOMY    . BACK SURGERY  10-09   Dr. Patrice Paradise  . BIOPSY  02/14/2020   Procedure: BIOPSY;  Surgeon: Gatha Mayer, MD;  Location: [facility];  Service: Endoscopy;;  . CARPAL TUNNEL RELEASE Right 08/14/2014   Procedure: RIGHT CARPAL TUNNEL RELEASE  AND INJECT LEFT THUMB;  Surgeon: Daryll Brod, MD;  Location: [place];  Service: Orthopedics;  Laterality: Right;  . CHOLECYSTECTOMY N/A 02/22/2020   Procedure: LAPAROSCOPIC CHOLECYSTECTOMY WITH INTRAOPERATIVE CHOLANGIOGRAM;  Surgeon: Kieth Brightly Arta Bruce, MD;  Location: [facility];  Service: General;  Laterality: N/A;  . COLONOSCOPY    . COLONOSCOPY WITH PROPOFOL N/A 02/14/2020   Procedure: COLONOSCOPY WITH PROPOFOL;  Surgeon: Gatha Mayer, MD;  Location: [facility];  Service: Endoscopy;  Laterality: N/A;  . ENDOSCOPIC RETROGRADE CHOLANGIOPANCREATOGRAPHY (ERCP) WITH  PROPOFOL N/A 12/25/2019   Procedure: ENDOSCOPIC RETROGRADE CHOLANGIOPANCREATOGRAPHY (ERCP) WITH PROPOFOL;  Surgeon: Gatha Mayer, MD;  Location: [facility];  Service: Gastroenterology;  Laterality: N/A;  . ESOPHAGOGASTRODUODENOSCOPY (EGD) WITH PROPOFOL N/A 01/06/2020   Procedure: ESOPHAGOGASTRODUODENOSCOPY (EGD) WITH PROPOFOL;  Surgeon: Irene Shipper, MD;  Location: [facility] ENDOSCOPY;  Service: Endoscopy;  Laterality: N/A;  . HOT HEMOSTASIS N/A 01/06/2020   Procedure: HOT HEMOSTASIS (ARGON PLASMA COAGULATION/BICAP);  Surgeon: Irene Shipper, MD;  Location: [facility] ENDOSCOPY;  Service: Endoscopy;  Laterality: N/A;  . INTRAMEDULLARY (IM) NAIL INTERTROCHANTERIC Right 10/01/2018   Procedure: INTRAMEDULLARY (IM) NAIL INTERTROCHANTRIC;  Surgeon: Thornton Park, MD;  Location: [facility] ORS;  Service: Orthopedics;  Laterality: Right;  . LAPAROSCOPY N/A 02/21/2013   Procedure: LAPAROSCOPY OPERATIVE;  Surgeon: Margarette Asal, MD;  Location: [facility];  Service: Gynecology;  Laterality: N/A;  REQUESTING 5MM SCOPE WITH CAMERA  . LEFT HEART CATH AND CORONARY ANGIOGRAPHY N/A 05/11/2019   Procedure: LEFT HEART CATH AND CORONARY ANGIOGRAPHY;  Surgeon: Sherren Mocha, MD;  Location: [place] CV LAB;  Service: Cardiovascular;  Laterality: N/A;  . REMOVAL OF STONES  12/25/2019   Procedure: Karlyn Agee;  Surgeon: Gatha Mayer, MD;  Location: [facility] ENDOSCOPY;  Service: Gastroenterology;;  . Clide Deutscher  01/06/2020   Procedure: Clide Deutscher;  Surgeon: Irene Shipper, MD;  Location: [facility] ENDOSCOPY;  Service: Endoscopy;;  . Joan Mayans  12/25/2019   Procedure: SPHINCTEROTOMY;  Surgeon: Gatha Mayer, MD;  Location: [place];  Service: Gastroenterology;;  . TONSILLECTOMY    . TUBAL LIGATION    . UPPER GASTROINTESTINAL ENDOSCOPY      Family Psychiatric History: Reviewed.  Family History:  Family History  Problem Relation Age of Onset  . Colon cancer Father   . Prostate cancer Father   . Heart disease Mother         prev MVR, also has DJD  . Colon cancer Brother   . Skin cancer Sister   . Alcohol abuse Daughter   . Bipolar disorder Daughter   . Drug abuse Daughter   . Esophageal cancer Neg Hx   . Rectal cancer Neg Hx   . Stomach cancer Neg Hx     Social History:  Social History   Socioeconomic History  . Marital status: Married    Spouse name: Not on file  . Number of children: 4  . Years of education: 41  . Highest education level: Not on file  Occupational History  . Occupation: retired    Comment: disablility  Tobacco Use  . Smoking status: Current Every Day Smoker    Packs/day: 1.00    Years: 30.00    Pack years: 30.00    Types: Cigarettes  . Smokeless tobacco: Never Used  Vaping Use  . Vaping Use: Never used  Substance and Sexual Activity  . Alcohol use: Yes    Alcohol/week: 3.0 standard drinks    Types: 3 Standard drinks or equivalent per week  Comment: occasional  . Drug use: No  . Sexual activity: Yes  Other Topics Concern  . Not on file  Social History Narrative   Married x 38 yrs. Has BS degree in Horticulture from [employer]. Currently resides in a house with her husband. 3 cats.  Fun: used to like to do a lot of things.    Denies religious beliefs that would effect health care.    lives with husband [name]; quit smoking- October 6th, 2019. ocasional alcohol. [employer] [2009] on disability sec to back issues.    Social Determinants of Health   Financial Resource Strain:   . Difficulty of Paying Living Expenses: Not on file  Food Insecurity:   . Worried About Charity fundraiser in the Last Year: Not on file  . Ran Out of Food in the Last Year: Not on file  Transportation Needs: No Transportation Needs  . Lack of Transportation (Medical): No  . Lack of Transportation (Non-Medical): No  Physical Activity:   . Days of Exercise per Week: Not on file  . Minutes of Exercise per Session: Not on file  Stress:   . Feeling of Stress : Not on file   Social Connections: Moderately Isolated  . Frequency of Communication with Friends and Family: More than three times a week  . Frequency of Social Gatherings with Friends and Family: Once a week  . Attends Religious Services: Never  . Active Member of Clubs or Organizations: No  . Attends Archivist Meetings: Never  . Marital Status: Married    Allergies:  Allergies  Allergen Reactions  . Atorvastatin Nausea And Vomiting  . Levofloxacin Other (See Comments)    Reaction: achilles tendon pain  . Omnicef [Cefdinir] Diarrhea    Uncontrollable diarrhea  . Trazodone And Nefazodone Other (See Comments)    "Felt like I was going to faint"  . Sulfa Antibiotics Rash  . Sulfonamide Derivatives Rash    Metabolic Disorder Labs: Lab Results  Component Value Date   HGBA1C 5.2 10/01/2018   MPG 103 10/01/2018   No results found for: PROLACTIN Lab Results  Component Value Date   CHOL 160 05/16/2020   TRIG 153 (H) 05/16/2020   HDL 61 05/16/2020   CHOLHDL 2.6 05/16/2020   VLDL 10 04/09/2019   LDLCALC 73 05/16/2020   LDLCALC 47 12/04/2019   Lab Results  Component Value Date   TSH 0.499 04/08/2019   TSH 1.108 10/01/2018    Therapeutic Level Labs: No results found for: LITHIUM No results found for: VALPROATE No components found for:  CBMZ  Current Medications: Current Outpatient Medications  Medication Sig Dispense Refill  . aspirin EC 81 MG tablet Take 1 tablet (81 mg total) by mouth daily.    . budesonide (ENTOCORT EC) 3 MG 24 hr capsule TAKE 3 CAPSULES ONCE DAILY. (Patient taking differently: Take 9 mg by mouth daily. ) 90 capsule 1  . buPROPion (WELLBUTRIN SR) 150 MG 12 hr tablet Take 1 tablet (150 mg total) by mouth daily. 90 tablet 0  . busPIRone (BUSPAR) 15 MG tablet Take 1 tablet (15 mg total) by mouth 2 (two) times daily. 180 tablet 0  . Cholecalciferol (VITAMIN D) 50 MCG (2000 UT) tablet Take 2,000 Units by mouth daily after breakfast.     . denosumab  (PROLIA) 60 MG/ML SOSY injection Inject 60 mg into the skin every 6 (six) months.    . Diphenoxylate-Atropine (LOMOTIL PO) Lomotil  1 po qid prn    .  doxycycline (VIBRAMYCIN) 100 MG capsule Take 1 capsule (100 mg total) by mouth 2 (two) times daily. 14 capsule 0  . folic acid (FOLVITE) 1 MG tablet Take 1 tablet (1 mg total) by mouth daily. (Patient taking differently: Take 1 mg by mouth daily after breakfast. ) 90 tablet 3  . furosemide (LASIX) 20 MG tablet furosemide 20 mg tablet    . gabapentin (NEURONTIN) 300 MG capsule Take 300 mg by mouth 3 (three) times daily.    . hyoscyamine (ANASPAZ) 0.125 MG TBDP disintergrating tablet Place 0.125 mg under the tongue every 6 (six) hours as needed ([name] cramps).     . morphine (MS CONTIN) 15 MG 12 hr tablet Take 15 mg by mouth 3 (three) times daily.    . Multiple Vitamin (MULTIVITAMIN WITH MINERALS) TABS tablet Take 1 tablet by mouth daily. 30 tablet 0  . nicotine (NICODERM CQ - DOSED IN MG/24 HOURS) 21 mg/24hr patch Place 1 patch (21 mg total) onto the skin daily. 28 patch 0  . nitroGLYCERIN (NITROSTAT) 0.4 MG SL tablet Place 1 tablet (0.4 mg total) under the tongue every 5 (five) minutes as needed for chest pain. 25 tablet 3  . ondansetron (ZOFRAN-ODT) 4 MG disintegrating tablet Take 4 mg by mouth every 8 (eight) hours as needed for nausea or vomiting.    . Oxycodone HCl 10 MG TABS Take 10 mg by mouth 4 (four) times daily.     . pantoprazole (PROTONIX) 40 MG tablet TAKE (1) TABLET TWICE A DAY BEFORE MEALS. (Patient taking differently: Take 40 mg by mouth 2 (two) times daily with a meal. ) 60 tablet 4  . potassium chloride (MICRO-K) 10 MEQ CR capsule Take 10 mEq by mouth 2 (two) times daily.    . rosuvastatin (CRESTOR) 5 MG tablet Take 1 tablet (5 mg total) by mouth every Monday, Wednesday, and Friday at 6 PM. 36 tablet 3  . sertraline (ZOLOFT) 100 MG tablet Take 2 tablets (200 mg total) by mouth at bedtime. 180 tablet 0  . spironolactone (ALDACTONE) 25 MG  tablet Take 0.5 tablets (12.5 mg total) by mouth daily. 45 tablet 3  . sucralfate (CARAFATE) 1 g tablet sucralfate 1 gram tablet    . valACYclovir (VALTREX) 500 MG tablet Take 500 mg by mouth 2 (two) times daily as needed (flare ups).     . vitamin B-12 (CYANOCOBALAMIN) 100 MCG tablet Take 1 tablet (100 mcg total) by mouth daily. (Patient taking differently: Take 100 mcg by mouth daily after breakfast. ) 90 tablet 3   No current facility-administered medications for this visit.      Psychiatric Specialty Exam: Review of Systems  Constitutional: Positive for fatigue.  Musculoskeletal:       Sternal pain  Psychiatric/Behavioral: The patient is nervous/anxious.     There were no vitals taken for this visit.There is no height or weight on file to calculate BMI.  General Appearance: NA  Eye Contact:  NA  Speech:  Clear and Coherent and Normal Rate  Volume:  Normal  Mood:  Anxious  Affect:  NA  Thought Process:  Goal Directed  Orientation:  Full (Time, Place, and Person)  Thought Content: Logical   Suicidal Thoughts:  No  Homicidal Thoughts:  No  Memory:  Immediate;   Fair Recent;   Fair Remote;   Good  Judgement:  Good  Insight:  Fair  Psychomotor Activity:  NA  Concentration:  Concentration: Good  Recall:  [place] of Knowledge: Good  Language: Good  Akathisia:  Negative  Handed:  Right  AIMS (if indicated): not done  Assets:  Communication Skills Desire for Improvement Financial Resources/Insurance Housing Resilience  ADL's:  Intact  Cognition: WNL  Sleep:  Fair   Screenings: PHQ2-9     Office Visit from 06/17/2020 in [place] at [facility] Visit from 03/14/2020 in [place] at [facility] Visit from 09/11/2019 in [place] Visit from 10/25/2015 in [facility] at [facility] Total Score 1 1 1 3   PHQ-9 Total Score 11 -- -- 18       Assessment and Plan: 69yo female with chronic  anxiety/depression which worsened with increased physical problems/pain. She has been for a long time on Cymbalta - we have cross tapered it to sertraline which she toleratedwell and dose has been increased to 150 mg as she continued to reports feeling anxious and depressed.She was taken off clonazepam out of concern about combining benzodiazepines with narcotic analgesics and instead buspirone was added. She finds itnot very effective but continues to take it.It is prescribed tid but [name] takes it bid.We have added a low dose of aripiprazole to augment sertraline and [name] reports that her mood started to improve.While in the hospital in December she was taken off hydroxyzine - she takes melatonin now and her sleep is adequate.She was restarted on alprazolam low dose as needed for anxiety in March (by GI) but her pain specialist has been concerned about likely interaction with opioid analgesics she is on. [name] reports falling in early July, broke sternum and several of her medications were stopped at that time (including alprazolam, buspirone, aripiprazole and bupropion). She is complaining of  sternal pain and will see a surgeon soon. She is on oxycodone and MS Contin. She continues to report feeling tired and low on drive/motivation.  GY:IRSWNIOEV disorder;GAD;Chronic pain disorder  Plan:ContinueZoloftto 200 mg at HS and resume Wellbutrin SR 150 mg in am. We will not restart buspirone15 mg bidandAbilify 2 mg in AM. Next appointment in2 months. The plan was discussed with patient who had an opportunity to ask questions and these were all answered. I spend24minutes inphone consultation with the patient.    Stephanie Acre, MD 08/19/2020, 2:48 PM

## 2020-08-19 NOTE — Telephone Encounter (Signed)
Raechel Chute from [facility] at [phone] and left a message for her to return call regarding medication questions.

## 2020-08-19 NOTE — Telephone Encounter (Signed)
Spoke with Denise King from [facility] who says the patient told her that she didn't need to take her Spirinolactone any longer but the patient could not remember which provider told her this. Denise King would like to know if patient should still be taking this medication as well as her Potassium pill? Will route to Dr. Johnsie Cancel for advisement.

## 2020-08-20 ENCOUNTER — Telehealth: Payer: Self-pay | Admitting: Internal Medicine

## 2020-08-20 DIAGNOSIS — I10 Essential (primary) hypertension: Secondary | ICD-10-CM

## 2020-08-20 DIAGNOSIS — J9611 Chronic respiratory failure with hypoxia: Secondary | ICD-10-CM | POA: Diagnosis not present

## 2020-08-20 DIAGNOSIS — J9612 Chronic respiratory failure with hypercapnia: Secondary | ICD-10-CM | POA: Diagnosis not present

## 2020-08-20 DIAGNOSIS — M5431 Sciatica, right side: Secondary | ICD-10-CM | POA: Diagnosis not present

## 2020-08-20 DIAGNOSIS — M810 Age-related osteoporosis without current pathological fracture: Secondary | ICD-10-CM | POA: Diagnosis not present

## 2020-08-20 DIAGNOSIS — J42 Unspecified chronic bronchitis: Secondary | ICD-10-CM | POA: Diagnosis not present

## 2020-08-20 DIAGNOSIS — S2249XD Multiple fractures of ribs, unspecified side, subsequent encounter for fracture with routine healing: Secondary | ICD-10-CM | POA: Diagnosis not present

## 2020-08-20 DIAGNOSIS — I5032 Chronic diastolic (congestive) heart failure: Secondary | ICD-10-CM | POA: Diagnosis not present

## 2020-08-20 DIAGNOSIS — G894 Chronic pain syndrome: Secondary | ICD-10-CM | POA: Diagnosis not present

## 2020-08-20 DIAGNOSIS — I110 Hypertensive heart disease with heart failure: Secondary | ICD-10-CM | POA: Diagnosis not present

## 2020-08-20 DIAGNOSIS — M1990 Unspecified osteoarthritis, unspecified site: Secondary | ICD-10-CM | POA: Diagnosis not present

## 2020-08-20 NOTE — Telephone Encounter (Signed)
   Tillie Rung from [facility] calling to update medication list Please call [phone]

## 2020-08-20 NOTE — Telephone Encounter (Signed)
Called Tillie Rung back to let her know at patient's office visit on 04/22/20, patient's furosemide and potassium was stopped and spirolactone 12.5 mg was started. At that time these medications were taken off her list. These medications are now back on her medications list. Informed her that she would need to talk to patient's PCP about these medications. Informed her that I can just state what we have done with medication changes and patient sees other doctors and has been in the hospital since then.

## 2020-08-23 ENCOUNTER — Institutional Professional Consult (permissible substitution): Payer: Medicare HMO | Admitting: Thoracic Surgery (Cardiothoracic Vascular Surgery)

## 2020-08-23 ENCOUNTER — Other Ambulatory Visit: Payer: Self-pay

## 2020-08-23 ENCOUNTER — Encounter: Payer: Self-pay | Admitting: Thoracic Surgery (Cardiothoracic Vascular Surgery)

## 2020-08-23 VITALS — BP 111/62 | HR 85 | Temp 98.2°F | Resp 20 | Ht 61.0 in | Wt 110.0 lb

## 2020-08-23 DIAGNOSIS — S2222XA Fracture of body of sternum, initial encounter for closed fracture: Secondary | ICD-10-CM

## 2020-08-23 MED ORDER — POTASSIUM CHLORIDE ER 10 MEQ PO CPCR: 10.0000 meq | ORAL_CAPSULE | Freq: Two times a day (BID) | ORAL | 3 refills | Status: DC

## 2020-08-23 NOTE — Progress Notes (Signed)
Erroneous entry

## 2020-08-23 NOTE — Telephone Encounter (Signed)
Ok for restart K pill but only one per day, and needs to check BMP 1 wk after restart, as the K can go too high while taking the aldactone

## 2020-08-23 NOTE — Progress Notes (Signed)
[address]       [place] [address]             [phone]                    Trenee B Iribe  Medical Record #[id number] Date of Birth: 02/21/1951  Referring: Josue Hector, MD Primary Care: Biagio Borg, MD Primary Cardiologist: Jenkins Rouge, MD  Chief Complaint:    Chief Complaint  Patient presents with  . Consult    Surgical eval on sternal fracture, Chest CT 08/09/20, ECHO 07/08/20     History of Present Illness:    JAYLENE ARROWOOD 69 y.o. female presents for surgical evaluation of a sternal fracture that was sustained on July 8.  The patient states that she coded at home and her daughter and EMS performed CPR.  She does have a history of heart failure, it is unclear as to what happened that caused her coding event.  Since that time she has had nagging chest wall pain that is made worse with upper extremity movement.  She has undergone physical therapy with slight improvement in her pain.  She is not too fond of undergoing surgical correction but wanted to discuss potential options.      Zubrod Score: At the time of surgery this patient's most appropriate activity status/level should be described as: [x]     0    Normal activity, no symptoms []     1    Restricted in physical strenuous activity but ambulatory, able to do out light work []     2    Ambulatory and capable of self care, unable to do work activities, up and about               >50 % of waking hours                              []     3    Only limited self care, in bed greater than 50% of waking hours []     4    Completely disabled, no self care, confined to bed or chair []     5    Moribund   Past Medical History:  Diagnosis Date  . Acute cystitis   . Acute respiratory failure ([place])   . Allergy    as a child grew out of them  . Anemia    pernicious anemia  . Anxiety   . Arthritis   . Back pain   . Blood transfusion   . CAP (community acquired pneumonia)   .  CHF (congestive heart failure) ([place])   . Chronic pain syndrome   . Common bile duct stone   . Depression   . Diverticulosis of colon   . Duodenal ulcer hemorrhagic-after biliary sphincterotomy   . Dysthymia   . Esophagitis   . EtOH dependence ([place])   . Gastritis   . GERD (gastroesophageal reflux disease)   . H/O chest pain Dec. 2013   no work up done  . Hx of colonic polyps   . Hypertension   . Irritable bowel syndrome   . Lymphocytic colitis 02/16/2020  . Osteopenia   . Osteoporosis   . Pneumonia 2019  . Polypharmacy   . Reflux esophagitis   . Right sided sciatica 12/22/2017  . Tobacco use disorder   . Unspecified chronic bronchitis ([place])   .  Vertigo   . Vitamin B12 deficiency   . Wears glasses     Past Surgical History:  Procedure Laterality Date  . APPENDECTOMY    . BACK SURGERY  10-09   Dr. Patrice Paradise  . BIOPSY  02/14/2020   Procedure: BIOPSY;  Surgeon: Gatha Mayer, MD;  Location: [facility];  Service: Endoscopy;;  . CARPAL TUNNEL RELEASE Right 08/14/2014   Procedure: RIGHT CARPAL TUNNEL RELEASE AND INJECT LEFT THUMB;  Surgeon: Daryll Brod, MD;  Location: [facility];  Service: Orthopedics;  Laterality: Right;  . CHOLECYSTECTOMY N/A 02/22/2020   Procedure: LAPAROSCOPIC CHOLECYSTECTOMY WITH INTRAOPERATIVE CHOLANGIOGRAM;  Surgeon: Kieth Brightly Arta Bruce, MD;  Location: [facility];  Service: General;  Laterality: N/A;  . COLONOSCOPY    . COLONOSCOPY WITH PROPOFOL N/A 02/14/2020   Procedure: COLONOSCOPY WITH PROPOFOL;  Surgeon: Gatha Mayer, MD;  Location: [facility];  Service: Endoscopy;  Laterality: N/A;  . ENDOSCOPIC RETROGRADE CHOLANGIOPANCREATOGRAPHY (ERCP) WITH PROPOFOL N/A 12/25/2019   Procedure: ENDOSCOPIC RETROGRADE CHOLANGIOPANCREATOGRAPHY (ERCP) WITH PROPOFOL;  Surgeon: Gatha Mayer, MD;  Location: [facility];  Service: Gastroenterology;  Laterality: N/A;  . ESOPHAGOGASTRODUODENOSCOPY (EGD) WITH PROPOFOL N/A 01/06/2020   Procedure:  ESOPHAGOGASTRODUODENOSCOPY (EGD) WITH PROPOFOL;  Surgeon: Irene Shipper, MD;  Location: [facility];  Service: Endoscopy;  Laterality: N/A;  . HOT HEMOSTASIS N/A 01/06/2020   Procedure: HOT HEMOSTASIS (ARGON PLASMA COAGULATION/BICAP);  Surgeon: Irene Shipper, MD;  Location: [facility]COPY;  Service: Endoscopy;  Laterality: N/A;  . INTRAMEDULLARY (IM) NAIL INTERTROCHANTERIC Right 10/01/2018   Procedure: INTRAMEDULLARY (IM) NAIL INTERTROCHANTRIC;  Surgeon: Thornton Park, MD;  Location: [facility];  Service: Orthopedics;  Laterality: Right;  . LAPAROSCOPY N/A 02/21/2013   Procedure: LAPAROSCOPY OPERATIVE;  Surgeon: Margarette Asal, MD;  Location: [facility];  Service: Gynecology;  Laterality: N/A;  REQUESTING 5MM SCOPE WITH CAMERA  . LEFT HEART CATH AND CORONARY ANGIOGRAPHY N/A 05/11/2019   Procedure: LEFT HEART CATH AND CORONARY ANGIOGRAPHY;  Surgeon: Sherren Mocha, MD;  Location: [facility];  Service: Cardiovascular;  Laterality: N/A;  . REMOVAL OF STONES  12/25/2019   Procedure: Karlyn Agee;  Surgeon: Gatha Mayer, MD;  Location: [facility];  Service: Gastroenterology;;  . Clide Deutscher  01/06/2020   Procedure: Clide Deutscher;  Surgeon: Irene Shipper, MD;  Location: [facility];  Service: Endoscopy;;  . Joan Mayans  12/25/2019   Procedure: SPHINCTEROTOMY;  Surgeon: Gatha Mayer, MD;  Location: [facility];  Service: Gastroenterology;;  . TONSILLECTOMY    . TUBAL LIGATION    . UPPER GASTROINTESTINAL ENDOSCOPY      Family History  Problem Relation Age of Onset  . Colon cancer Father   . Prostate cancer Father   . Heart disease Mother        prev MVR, also has DJD  . Colon cancer Brother   . Skin cancer Sister   . Alcohol abuse Daughter   . Bipolar disorder Daughter   . Drug abuse Daughter   . Esophageal cancer Neg Hx   . Rectal cancer Neg Hx   . Stomach cancer Neg Hx      Social History   Tobacco Use  Smoking Status Current Every Day Smoker  . Packs/day: 1.00  .  Years: 30.00  . Pack years: 30.00  . Types: Cigarettes  Smokeless Tobacco Never Used    Social History   Substance and Sexual Activity  Alcohol Use Yes  . Alcohol/week: 3.0 standard drinks  . Types: 3 Standard drinks or equivalent  per week   Comment: occasional     Allergies  Allergen Reactions  . Atorvastatin Nausea And Vomiting  . Levofloxacin Other (See Comments)    Reaction: achilles tendon pain  . Omnicef [Cefdinir] Diarrhea    Uncontrollable diarrhea  . Trazodone And Nefazodone Other (See Comments)    "Felt like I was going to faint"  . Sulfa Antibiotics Rash  . Sulfonamide Derivatives Rash    Current Outpatient Medications  Medication Sig Dispense Refill  . aspirin EC 81 MG tablet Take 1 tablet (81 mg total) by mouth daily.    . budesonide (ENTOCORT EC) 3 MG 24 hr capsule TAKE 3 CAPSULES ONCE DAILY. (Patient taking differently: Take 9 mg by mouth daily. ) 90 capsule 1  . buPROPion (WELLBUTRIN SR) 150 MG 12 hr tablet Take 1 tablet (150 mg total) by mouth daily. 90 tablet 0  . busPIRone (BUSPAR) 15 MG tablet Take 1 tablet (15 mg total) by mouth 2 (two) times daily. 180 tablet 0  . Cholecalciferol (VITAMIN D) 50 MCG (2000 UT) tablet Take 2,000 Units by mouth daily after breakfast.     . denosumab (PROLIA) 60 MG/ML SOSY injection Inject 60 mg into the skin every 6 (six) months.    . Diphenoxylate-Atropine (LOMOTIL PO) Lomotil  1 po qid prn    . folic acid (FOLVITE) 1 MG tablet Take 1 tablet (1 mg total) by mouth daily. (Patient taking differently: Take 1 mg by mouth daily after breakfast. ) 90 tablet 3  . gabapentin (NEURONTIN) 300 MG capsule Take 300 mg by mouth 3 (three) times daily.    . hyoscyamine (ANASPAZ) 0.125 MG TBDP disintergrating tablet Place 0.125 mg under the tongue every 6 (six) hours as needed ([name] cramps).     . morphine (MS CONTIN) 15 MG 12 hr tablet Take 15 mg by mouth 3 (three) times daily.    . Multiple Vitamin (MULTIVITAMIN WITH MINERALS) TABS tablet  Take 1 tablet by mouth daily. 30 tablet 0  . nicotine (NICODERM CQ - DOSED IN MG/24 HOURS) 21 mg/24hr patch Place 1 patch (21 mg total) onto the skin daily. 28 patch 0  . nitroGLYCERIN (NITROSTAT) 0.4 MG SL tablet Place 1 tablet (0.4 mg total) under the tongue every 5 (five) minutes as needed for chest pain. 25 tablet 3  . ondansetron (ZOFRAN-ODT) 4 MG disintegrating tablet Take 4 mg by mouth every 8 (eight) hours as needed for nausea or vomiting.    . Oxycodone HCl 10 MG TABS Take 10 mg by mouth 4 (four) times daily.     . pantoprazole (PROTONIX) 40 MG tablet TAKE (1) TABLET TWICE A DAY BEFORE MEALS. (Patient taking differently: Take 40 mg by mouth 2 (two) times daily with a meal. ) 60 tablet 4  . potassium chloride (MICRO-K) 10 MEQ CR capsule Take 10 mEq by mouth 2 (two) times daily.    . rosuvastatin (CRESTOR) 5 MG tablet Take 1 tablet (5 mg total) by mouth every Monday, Wednesday, and Friday at 6 PM. 36 tablet 3  . sertraline (ZOLOFT) 100 MG tablet Take 2 tablets (200 mg total) by mouth at bedtime. 180 tablet 0  . spironolactone (ALDACTONE) 25 MG tablet Take 0.5 tablets (12.5 mg total) by mouth daily. 45 tablet 3  . valACYclovir (VALTREX) 500 MG tablet Take 500 mg by mouth 2 (two) times daily as needed (flare ups).     . vitamin B-12 (CYANOCOBALAMIN) 100 MCG tablet Take 1 tablet (100 mcg total) by mouth daily. (  Patient taking differently: Take 100 mcg by mouth daily after breakfast. ) 90 tablet 3   No current facility-administered medications for this visit.    Review of Systems  Constitutional: Positive for malaise/fatigue.  Respiratory: Positive for shortness of breath.   Cardiovascular: Positive for chest pain.  Musculoskeletal: Positive for back pain, joint pain and myalgias.  Neurological: Positive for focal weakness.  Psychiatric/Behavioral: Positive for depression. The patient is nervous/anxious.      PHYSICAL EXAMINATION: BP 111/62 (BP Location: Left Arm, Patient Position:  Sitting, Cuff Size: Normal)   Pulse 85   Temp 98.2 F (36.8 C) (Temporal)   Resp 20   Ht 5\' 1"  (1.549 m)   Wt 110 lb (49.9 kg)   SpO2 94% Comment: RA  BMI 20.78 kg/m  Physical Exam Constitutional:      General: She is not in acute distress.    Appearance: Normal appearance. She is normal weight. She is not ill-appearing.  HENT:     Head: Normocephalic and atraumatic.  Cardiovascular:     Comments: There is a step-off along her mid and lower sternal body.  No pain to palpation.  Her sternum is stable.  Some tenderness along her left anterior chest wall. Pulmonary:     Effort: Pulmonary effort is normal. No respiratory distress.  Abdominal:     General: There is no distension.  Musculoskeletal:     Comments: Severe scoliosis in the leans to the right.  Skin:    General: Skin is warm and dry.  Neurological:     General: No focal deficit present.     Mental Status: She is alert and oriented to person, place, and time.  Psychiatric:        Mood and Affect: Mood normal.     Diagnostic Studies & Laboratory data:     Recent Radiology Findings:   DG Sternum  Result Date: 07/31/2020 CLINICAL DATA:  Status post CPR. EXAM: STERNUM - 2+ VIEW COMPARISON:  July 08, 2020 FINDINGS: An acute fracture deformity is seen involving the mid to upper portion of the body of the sternum. Approximately 5 mm anterior displacement of the inferior aspect of the body of the sternum is seen. An additional fracture deformity of indeterminate age is noted involving the lower portion of the body of the sternum. Radiopaque surgical clips are seen within the abdomen on the lateral view. Bilateral radiopaque pedicle screws are seen within the visualized portion of the lumbar spine. IMPRESSION: 1. Acute fracture of the mid to upper body of the sternum. 2. Age-indeterminate fracture of the lower portion of the body of the sternum. Electronically Signed   By: Virgina Norfolk M.D.   On: 07/31/2020 16:49   CT Chest  Wo Contrast  Result Date: 08/09/2020 CLINICAL DATA:  Sternal fracture due to cardiopulmonary resuscitation on 07/04/2020. EXAM: CT CHEST WITHOUT CONTRAST TECHNIQUE: Multidetector CT imaging of the chest was performed following the standard protocol without IV contrast. COMPARISON:  12/23/2019. FINDINGS: Cardiovascular: Atherosclerotic calcification of the aorta and coronary arteries. Heart size normal. No pericardial effusion. Mediastinum/Nodes: Mediastinal lymph nodes are not enlarged by CT size criteria. Hilar regions are difficult to definitively evaluate without IV contrast. No axillary adenopathy. Esophagus is grossly unremarkable. Lungs/Pleura: Centrilobular emphysema. Mild cylindrical bronchiectasis. Volume loss in the posteromedial right lower lobe. 3 mm right lower lobe nodule (3/71), not well seen on 12/23/2019. Probable nodular scarring in the perihilar right lower lobe, measuring 0.4 x 1.1 cm (3/72), also new. Peribronchovascular nodularity in the  posterior left lower lobe, likely postinfectious in etiology. No pleural fluid. Adherent debris in the trachea. Upper Abdomen: Subcentimeter low-attenuation lesion in the left hepatic lobe is too small to characterize. Cholecystectomy. Adrenal glands are unremarkable. A stone is seen in the right kidney. Renal vascular calcifications on the left. Visualized portions of the spleen, pancreas, stomach and bowel are otherwise unremarkable. Musculoskeletal: There is a healing fracture of the sternum, near the junction of the upper and middle thirds, with 3 mm ventral displacement of the distal fracture fragment. Degenerative changes in the spine. No worrisome lytic or sclerotic lesions. IMPRESSION: 1. Healing sternal fracture. 2. Likely postinfectious/postinflammatory scarring in the lower lobes. Given the slight nodular appearance in the central right lower lobe, follow-up CT chest without contrast in 3 months is recommended, as clinically indicated. 3. Right  renal stone. 4. Aortic atherosclerosis (ICD10-I70.0). Coronary artery calcification. 5.  Emphysema (ICD10-J43.9). Electronically Signed   By: Lorin Picket M.D.   On: 08/09/2020 16:45       I have independently reviewed the above radiology studies  and reviewed the findings with the patient.   Recent Lab Findings: Lab Results  Component Value Date   WBC 7.8 07/25/2020   HGB 12.3 07/25/2020   HCT 37.3 07/25/2020   PLT 367 07/25/2020   GLUCOSE 79 07/25/2020   CHOL 160 05/16/2020   TRIG 153 (H) 05/16/2020   HDL 61 05/16/2020   LDLDIRECT 102.4 05/24/2008   LDLCALC 73 05/16/2020   ALT 7 07/25/2020   AST 13 07/25/2020   NA 140 07/25/2020   K 4.0 07/25/2020   CL 105 07/25/2020   CREATININE 0.84 07/25/2020   BUN 12 07/25/2020   CO2 29 07/25/2020   TSH 0.499 04/08/2019   INR 1.0 07/04/2020   HGBA1C 5.2 10/01/2018       Assessment / Plan:   69 year old female that sustained a sternal fracture following CPR in July of this year.  I personally reviewed her cross-sectional imaging and she has 2 fractures along her sternal body that appear to be healing they are both slightly displaced, but on exam her sternum is stable.  She did receive some benefit from physical therapy but admits to not using her upper extremities much.  We discussed surgical fixation and she is not in agreement with proceeding with that.  She would like to try more physical therapy, and I have referred her to our cardiac rehab center specifically to focus on upper extremity mobility.     I  spent 40 minutes with  the patient face to face and greater then 50% of the time was spent in counseling and coordination of care.    Lajuana Matte 08/23/2020 1:23 PM

## 2020-08-23 NOTE — Telephone Encounter (Signed)
Was able to speak with Tillie Rung from [facility] and send a note to Dr. Jenny Reichmann about some clarity of pt taking potassium.

## 2020-08-26 DIAGNOSIS — M5431 Sciatica, right side: Secondary | ICD-10-CM | POA: Diagnosis not present

## 2020-08-26 DIAGNOSIS — G894 Chronic pain syndrome: Secondary | ICD-10-CM | POA: Diagnosis not present

## 2020-08-26 DIAGNOSIS — J9611 Chronic respiratory failure with hypoxia: Secondary | ICD-10-CM | POA: Diagnosis not present

## 2020-08-26 DIAGNOSIS — S2249XD Multiple fractures of ribs, unspecified side, subsequent encounter for fracture with routine healing: Secondary | ICD-10-CM | POA: Diagnosis not present

## 2020-08-26 DIAGNOSIS — I5032 Chronic diastolic (congestive) heart failure: Secondary | ICD-10-CM | POA: Diagnosis not present

## 2020-08-26 DIAGNOSIS — M1990 Unspecified osteoarthritis, unspecified site: Secondary | ICD-10-CM | POA: Diagnosis not present

## 2020-08-26 DIAGNOSIS — M810 Age-related osteoporosis without current pathological fracture: Secondary | ICD-10-CM | POA: Diagnosis not present

## 2020-08-26 DIAGNOSIS — J42 Unspecified chronic bronchitis: Secondary | ICD-10-CM | POA: Diagnosis not present

## 2020-08-26 DIAGNOSIS — I110 Hypertensive heart disease with heart failure: Secondary | ICD-10-CM | POA: Diagnosis not present

## 2020-08-26 DIAGNOSIS — J9612 Chronic respiratory failure with hypercapnia: Secondary | ICD-10-CM | POA: Diagnosis not present

## 2020-08-28 ENCOUNTER — Other Ambulatory Visit: Payer: Self-pay | Admitting: Cardiovascular Disease

## 2020-08-29 DIAGNOSIS — M1990 Unspecified osteoarthritis, unspecified site: Secondary | ICD-10-CM | POA: Diagnosis not present

## 2020-08-29 DIAGNOSIS — J9612 Chronic respiratory failure with hypercapnia: Secondary | ICD-10-CM | POA: Diagnosis not present

## 2020-08-29 DIAGNOSIS — J9611 Chronic respiratory failure with hypoxia: Secondary | ICD-10-CM | POA: Diagnosis not present

## 2020-08-29 DIAGNOSIS — J42 Unspecified chronic bronchitis: Secondary | ICD-10-CM | POA: Diagnosis not present

## 2020-08-29 DIAGNOSIS — S2249XD Multiple fractures of ribs, unspecified side, subsequent encounter for fracture with routine healing: Secondary | ICD-10-CM | POA: Diagnosis not present

## 2020-08-29 DIAGNOSIS — M810 Age-related osteoporosis without current pathological fracture: Secondary | ICD-10-CM | POA: Diagnosis not present

## 2020-08-29 DIAGNOSIS — I110 Hypertensive heart disease with heart failure: Secondary | ICD-10-CM | POA: Diagnosis not present

## 2020-08-29 DIAGNOSIS — I5032 Chronic diastolic (congestive) heart failure: Secondary | ICD-10-CM | POA: Diagnosis not present

## 2020-08-29 DIAGNOSIS — G894 Chronic pain syndrome: Secondary | ICD-10-CM | POA: Diagnosis not present

## 2020-08-29 DIAGNOSIS — M5431 Sciatica, right side: Secondary | ICD-10-CM | POA: Diagnosis not present

## 2020-09-03 DIAGNOSIS — I509 Heart failure, unspecified: Secondary | ICD-10-CM | POA: Diagnosis not present

## 2020-09-05 DIAGNOSIS — R0789 Other chest pain: Secondary | ICD-10-CM | POA: Diagnosis not present

## 2020-09-05 DIAGNOSIS — G894 Chronic pain syndrome: Secondary | ICD-10-CM | POA: Diagnosis not present

## 2020-09-05 DIAGNOSIS — M25551 Pain in right hip: Secondary | ICD-10-CM | POA: Diagnosis not present

## 2020-09-05 DIAGNOSIS — Z79891 Long term (current) use of opiate analgesic: Secondary | ICD-10-CM | POA: Diagnosis not present

## 2020-09-06 ENCOUNTER — Telehealth (HOSPITAL_COMMUNITY): Payer: Self-pay | Admitting: *Deleted

## 2020-09-06 NOTE — Telephone Encounter (Signed)
Received referral for this pt to participate in Cardiac Rehab from Dr. Kipp Brood.  Unfortunately with the diagnosis of CHF and sternal fracture the EF must be 35% or below per Medicare guidelines. Dr. Kipp Brood made aware and request made for any handouts for upper body exercises be sent to the patient. Clarified with Dr. Kipp Brood  that pt does not have any restrictions.  Called and spoke to pt who completed home PT several weeks ago.  Per pt the emphasis was on performing ADLS safely and walking. She was not instructed on any exercises to continue after therapy ended.  Pt is not very active outside of the home and really did not seem motivated to increase physical activity.  Pt instructed to begin walking 5-10 day on a level surface and increase her time as she is able to. Pt complains that she continues to have back pain and soreness from CPR.  Will send pt light weight bands and handout on strength training.  Will include light hand weight - aware she can use a bottle of water or can good.  Verified address and will check back in with pt in a couple of weeks to see how she is doing. Cherre Huger, BSN Cardiac and Training and development officer

## 2020-09-09 DIAGNOSIS — I509 Heart failure, unspecified: Secondary | ICD-10-CM | POA: Diagnosis not present

## 2020-09-09 DIAGNOSIS — M810 Age-related osteoporosis without current pathological fracture: Secondary | ICD-10-CM | POA: Diagnosis not present

## 2020-09-27 ENCOUNTER — Telehealth: Payer: Self-pay

## 2020-09-27 NOTE — Telephone Encounter (Signed)
Opened in error

## 2020-10-01 DIAGNOSIS — M810 Age-related osteoporosis without current pathological fracture: Secondary | ICD-10-CM | POA: Diagnosis not present

## 2020-10-02 DIAGNOSIS — M25551 Pain in right hip: Secondary | ICD-10-CM | POA: Diagnosis not present

## 2020-10-02 DIAGNOSIS — R0789 Other chest pain: Secondary | ICD-10-CM | POA: Diagnosis not present

## 2020-10-02 DIAGNOSIS — G894 Chronic pain syndrome: Secondary | ICD-10-CM | POA: Diagnosis not present

## 2020-10-02 DIAGNOSIS — Z79891 Long term (current) use of opiate analgesic: Secondary | ICD-10-CM | POA: Diagnosis not present

## 2020-10-03 DIAGNOSIS — I509 Heart failure, unspecified: Secondary | ICD-10-CM | POA: Diagnosis not present

## 2020-10-09 DIAGNOSIS — I509 Heart failure, unspecified: Secondary | ICD-10-CM | POA: Diagnosis not present

## 2020-10-11 ENCOUNTER — Other Ambulatory Visit: Payer: Self-pay | Admitting: Internal Medicine

## 2020-10-21 ENCOUNTER — Other Ambulatory Visit: Payer: Self-pay

## 2020-10-21 ENCOUNTER — Telehealth (INDEPENDENT_AMBULATORY_CARE_PROVIDER_SITE_OTHER): Payer: Medicare HMO | Admitting: Psychiatry

## 2020-10-21 DIAGNOSIS — F411 Generalized anxiety disorder: Secondary | ICD-10-CM

## 2020-10-21 DIAGNOSIS — F341 Dysthymic disorder: Secondary | ICD-10-CM | POA: Diagnosis not present

## 2020-10-21 DIAGNOSIS — R69 Illness, unspecified: Secondary | ICD-10-CM | POA: Diagnosis not present

## 2020-10-21 MED ORDER — SERTRALINE HCL 100 MG PO TABS: 200.0000 mg | ORAL_TABLET | Freq: Every day | ORAL | 1 refills | Status: DC

## 2020-10-21 MED ORDER — BUPROPION HCL ER (SR) 150 MG PO TB12: 150.0000 mg | ORAL_TABLET | Freq: Every day | ORAL | 1 refills | Status: DC

## 2020-10-21 NOTE — Progress Notes (Signed)
BH MD/PA/NP OP Progress Note  10/21/2020 2:41 PM Denise King  MRN:  [id number] Interview was conducted by phone and I verified that I was speaking with the correct person using two identifiers. I discussed the limitations of evaluation and management by telemedicine and  the availability of in person appointments. Patient expressed understanding and agreed to proceed. Patient location - home; physician - home office.  Chief Complaint: None.  HPI: 69yo female with chronic anxiety/depression which worsened with increased physical problems/pain. She has been for a long time on Cymbalta - we have cross tapered it to sertraline which she toleratedwell and dose has been increased to 150 mg as she continued to reports feeling anxious and depressed.She was taken off clonazepam out of concern about combining benzodiazepines with narcotic analgesics and instead buspirone was added. She did not find it very effective.We have added a low dose of aripiprazole to augment sertraline and Denise King reported that her mood started to improve.While in the hospital in December she was taken off hydroxyzine - she takes melatonin now and her sleep is adequate.She was restarted on alprazolam low dose as needed for anxietyin March (by GI) but her pain specialist has been concerned about likely interaction with opioid analgesics she is on.Denise King reports falling in early July, broke sternum (although chart indicated that it was a result of CPR) and several of her medications were stopped at that time (including alprazolam, buspirone, aripiprazole and bupropion). She remains on oxycodone and MS Contin for chronic pain. She reports that her mood has been fairly stable recently: back on sertyraline and burpopion.     Visit Diagnosis:    ICD-10-CM   1. DYSTHYMIA  F34.1   2. Generalized anxiety disorder  F41.1     Past Psychiatric History: Please see intake H&P.  Past Medical History:  Past Medical History:   Diagnosis Date  . Acute cystitis   . Acute respiratory failure ([place])   . Allergy    as a child grew out of them  . Anemia    pernicious anemia  . Anxiety   . Arthritis   . Back pain   . Blood transfusion   . CAP (community acquired pneumonia)   . CHF (congestive heart failure) ([place])   . Chronic pain syndrome   . Common bile duct stone   . Depression   . Diverticulosis of colon   . Duodenal ulcer hemorrhagic-after biliary sphincterotomy   . Dysthymia   . Esophagitis   . EtOH dependence ([place])   . Gastritis   . GERD (gastroesophageal reflux disease)   . H/O chest pain Dec. 2013   no work up done  . Hx of colonic polyps   . Hypertension   . Irritable bowel syndrome   . Lymphocytic colitis 02/16/2020  . Osteopenia   . Osteoporosis   . Pneumonia 2019  . Polypharmacy   . Reflux esophagitis   . Right sided sciatica 12/22/2017  . Tobacco use disorder   . Unspecified chronic bronchitis ([place])   . Vertigo   . Vitamin B12 deficiency   . Wears glasses     Past Surgical History:  Procedure Laterality Date  . APPENDECTOMY    . BACK SURGERY  10-09   Dr. Patrice Paradise  . BIOPSY  02/14/2020   Procedure: BIOPSY;  Surgeon: Gatha Mayer, MD;  Location: [facility];  Service: Endoscopy;;  . CARPAL TUNNEL RELEASE Right 08/14/2014   Procedure: RIGHT CARPAL TUNNEL RELEASE AND INJECT LEFT THUMB;  Surgeon: Dominica Severin  Fredna Dow, MD;  Location: [facility];  Service: Orthopedics;  Laterality: Right;  . CHOLECYSTECTOMY N/A 02/22/2020   Procedure: LAPAROSCOPIC CHOLECYSTECTOMY WITH INTRAOPERATIVE CHOLANGIOGRAM;  Surgeon: Kieth Brightly Arta Bruce, MD;  Location: [facility];  Service: General;  Laterality: N/A;  . COLONOSCOPY    . COLONOSCOPY WITH PROPOFOL N/A 02/14/2020   Procedure: COLONOSCOPY WITH PROPOFOL;  Surgeon: Gatha Mayer, MD;  [facility];  Service: Endoscopy;  Laterality: N/A;  . ENDOSCOPIC RETROGRADE CHOLANGIOPANCREATOGRAPHY (ERCP) WITH PROPOFOL N/A 12/25/2019   Procedure:  ENDOSCOPIC RETROGRADE CHOLANGIOPANCREATOGRAPHY (ERCP) WITH PROPOFOL;  Surgeon: Gatha Mayer, MD;  Location: [facility];  Service: Gastroenterology;  Laterality: N/A;  . ESOPHAGOGASTRODUODENOSCOPY (EGD) WITH PROPOFOL N/A 01/06/2020   Procedure: ESOPHAGOGASTRODUODENOSCOPY (EGD) WITH PROPOFOL;  Surgeon: Irene Shipper, MD;  Location: [facility];  Service: Endoscopy;  Laterality: N/A;  . HOT HEMOSTASIS N/A 01/06/2020   Procedure: HOT HEMOSTASIS (ARGON PLASMA COAGULATION/BICAP);  Surgeon: Irene Shipper, MD;  Location: [facility] ENDOSCOPY;  Service: Endoscopy;  Laterality: N/A;  . INTRAMEDULLARY (IM) NAIL INTERTROCHANTERIC Right 10/01/2018   Procedure: INTRAMEDULLARY (IM) NAIL INTERTROCHANTRIC;  Surgeon: Thornton Park, MD;  Location: [facility]S;  Service: Orthopedics;  Laterality: Right;  . LAPAROSCOPY N/A 02/21/2013   Procedure: LAPAROSCOPY OPERATIVE;  Surgeon: Margarette Asal, MD;  Location: [facility];  Service: Gynecology;  Laterality: N/A;  REQUESTING 5MM SCOPE WITH CAMERA  . LEFT HEART CATH AND CORONARY ANGIOGRAPHY N/A 05/11/2019   Procedure: LEFT HEART CATH AND CORONARY ANGIOGRAPHY;  Surgeon: Sherren Mocha, MD;  Location: [facility];  Service: Cardiovascular;  Laterality: N/A;  . REMOVAL OF STONES  12/25/2019   Procedure: Karlyn Agee;  Surgeon: Gatha Mayer, MD;  Location: [facility] ENDOSCOPY;  Service: Gastroenterology;;  . Clide Deutscher  01/06/2020   Procedure: Clide Deutscher;  Surgeon: Irene Shipper, MD;  Location: [facility]COPY;  Service: Endoscopy;;  . Joan Mayans  12/25/2019   Procedure: SPHINCTEROTOMY;  Surgeon: Gatha Mayer, MD;  Location: [facility];  Service: Gastroenterology;;  . TONSILLECTOMY    . TUBAL LIGATION    . UPPER GASTROINTESTINAL ENDOSCOPY      Family Psychiatric History: Reviewed.  Family History:  Family History  Problem Relation Age of Onset  . Colon cancer Father   . Prostate cancer Father   . Heart disease Mother        prev MVR, also has DJD  . Colon  cancer Brother   . Skin cancer Sister   . Alcohol abuse Daughter   . Bipolar disorder Daughter   . Drug abuse Daughter   . Esophageal cancer Neg Hx   . Rectal cancer Neg Hx   . Stomach cancer Neg Hx     Social History:  Social History   Socioeconomic History  . Marital status: Married    Spouse name: Not on file  . Number of children: 4  . Years of education: 19  . Highest education level: Not on file  Occupational History  . Occupation: retired    Comment: disablility  Tobacco Use  . Smoking status: Current Every Day Smoker    Packs/day: 1.00    Years: 30.00    Pack years: 30.00    Types: Cigarettes  . Smokeless tobacco: Never Used  Vaping Use  . Vaping Use: Never used  Substance and Sexual Activity  . Alcohol use: Yes    Alcohol/week: 3.0 standard drinks    Types: 3 Standard drinks or equivalent per week    Comment: occasional  . Drug use:  No  . Sexual activity: Yes  Other Topics Concern  . Not on file  Social History Narrative   Married x 38 yrs. Has BS degree in Horticulture from [employer]. Currently resides in a house with her husband. 3 cats.  Fun: used to like to do a lot of things.    Denies religious beliefs that would effect health care.    lives with husband [name]; quit smoking- October 6th, 2019. ocasional alcohol. [employer] [2009] on disability sec to back issues.    Social Determinants of Health   Financial Resource Strain:   . Difficulty of Paying Living Expenses: Not on file  Food Insecurity:   . Worried About Charity fundraiser in the Last Year: Not on file  . Ran Out of Food in the Last Year: Not on file  Transportation Needs: No Transportation Needs  . Lack of Transportation (Medical): No  . Lack of Transportation (Non-Medical): No  Physical Activity:   . Days of Exercise per Week: Not on file  . Minutes of Exercise per Session: Not on file  Stress:   . Feeling of Stress : Not on file  Social Connections: Moderately  Isolated  . Frequency of Communication with Friends and Family: More than three times a week  . Frequency of Social Gatherings with Friends and Family: Once a week  . Attends Religious Services: Never  . Active Member of Clubs or Organizations: No  . Attends Archivist Meetings: Never  . Marital Status: Married    Allergies:  Allergies  Allergen Reactions  . Atorvastatin Nausea And Vomiting  . Levofloxacin Other (See Comments)    Reaction: achilles tendon pain  . Omnicef [Cefdinir] Diarrhea    Uncontrollable diarrhea  . Trazodone And Nefazodone Other (See Comments)    "Felt like I was going to faint"  . Sulfa Antibiotics Rash  . Sulfonamide Derivatives Rash    Metabolic Disorder Labs: Lab Results  Component Value Date   HGBA1C 5.2 10/01/2018   MPG 103 10/01/2018   No results found for: PROLACTIN Lab Results  Component Value Date   CHOL 160 05/16/2020   TRIG 153 (H) 05/16/2020   HDL 61 05/16/2020   CHOLHDL 2.6 05/16/2020   VLDL 10 04/09/2019   LDLCALC 73 05/16/2020   LDLCALC 47 12/04/2019   Lab Results  Component Value Date   TSH 0.499 04/08/2019   TSH 1.108 10/01/2018    Therapeutic Level Labs: No results found for: LITHIUM No results found for: VALPROATE No components found for:  CBMZ  Current Medications: Current Outpatient Medications  Medication Sig Dispense Refill  . aspirin EC 81 MG tablet Take 1 tablet (81 mg total) by mouth daily.    . budesonide (ENTOCORT EC) 3 MG 24 hr capsule TAKE 3 CAPSULES ONCE DAILY. (Patient taking differently: Take 9 mg by mouth daily. ) 90 capsule 1  . buPROPion (WELLBUTRIN SR) 150 MG 12 hr tablet Take 1 tablet (150 mg total) by mouth daily. 90 tablet 1  . Cholecalciferol (VITAMIN D) 50 MCG (2000 UT) tablet Take 2,000 Units by mouth daily after breakfast.     . denosumab (PROLIA) 60 MG/ML SOSY injection Inject 60 mg into the skin every 6 (six) months.    . Diphenoxylate-Atropine (LOMOTIL PO) Lomotil  1 po qid prn     . folic acid (FOLVITE) 1 MG tablet Take 1 tablet (1 mg total) by mouth daily. (Patient taking differently: Take 1 mg by mouth daily after breakfast. )  90 tablet 3  . gabapentin (NEURONTIN) 300 MG capsule Take 300 mg by mouth 3 (three) times daily.    . hyoscyamine (ANASPAZ) 0.125 MG TBDP disintergrating tablet Place 0.125 mg under the tongue every 6 (six) hours as needed ([name] cramps).     . morphine (MS CONTIN) 15 MG 12 hr tablet Take 15 mg by mouth 3 (three) times daily.    . Multiple Vitamin (MULTIVITAMIN WITH MINERALS) TABS tablet Take 1 tablet by mouth daily. 30 tablet 0  . nicotine (NICODERM CQ - DOSED IN MG/24 HOURS) 21 mg/24hr patch Place 1 patch (21 mg total) onto the skin daily. 28 patch 0  . nitroGLYCERIN (NITROSTAT) 0.4 MG SL tablet Place 1 tablet (0.4 mg total) under the tongue every 5 (five) minutes as needed for chest pain. 25 tablet 3  . ondansetron (ZOFRAN-ODT) 4 MG disintegrating tablet DISSOLVE 1 TABLET ON TONGUE EVERY 8 HOURS AS NEEDED FOR NAUSEA/VOMITING. 15 tablet 0  . Oxycodone HCl 10 MG TABS Take 10 mg by mouth 4 (four) times daily.     . pantoprazole (PROTONIX) 40 MG tablet TAKE (1) TABLET TWICE A DAY BEFORE MEALS. (Patient taking differently: Take 40 mg by mouth 2 (two) times daily with a meal. ) 60 tablet 4  . potassium chloride (MICRO-K) 10 MEQ CR capsule Take 1 capsule (10 mEq total) by mouth 2 (two) times daily. 90 capsule 3  . rosuvastatin (CRESTOR) 5 MG tablet Take 1 tablet (5 mg total) by mouth 3 (three) times a week. 39 tablet 3  . [START ON 11/15/2020] sertraline (ZOLOFT) 100 MG tablet Take 2 tablets (200 mg total) by mouth at bedtime. 180 tablet 1  . spironolactone (ALDACTONE) 25 MG tablet Take 0.5 tablets (12.5 mg total) by mouth daily. 45 tablet 3  . valACYclovir (VALTREX) 500 MG tablet Take 500 mg by mouth 2 (two) times daily as needed (flare ups).     . vitamin B-12 (CYANOCOBALAMIN) 100 MCG tablet Take 1 tablet (100 mcg total) by mouth daily. (Patient taking  differently: Take 100 mcg by mouth daily after breakfast. ) 90 tablet 3   No current facility-administered medications for this visit.    Psychiatric Specialty Exam: Review of Systems  Constitutional: Positive for fatigue.  Musculoskeletal: Positive for arthralgias and back pain.  Psychiatric/Behavioral: The patient is nervous/anxious.   All other systems reviewed and are negative.   There were no vitals taken for this visit.There is no height or weight on file to calculate BMI.  General Appearance: NA  Eye Contact:  NA  Speech:  Clear and Coherent and Normal Rate  Volume:  Normal  Mood:  Anxious  Affect:  NA  Thought Process:  Goal Directed  Orientation:  Full (Time, Place, and Person)  Thought Content: Logical   Suicidal Thoughts:  No  Homicidal Thoughts:  No  Memory:  Immediate;   Good Recent;   Good Remote;   Good  Judgement:  Good  Insight:  Good  Psychomotor Activity:  NA  Concentration:  Concentration: Good  Recall:  Good  Fund of Knowledge: Good  Language: Good  Akathisia:  Negative  Handed:  Right  AIMS (if indicated): not done  Assets:  Communication Skills Desire for Improvement Financial Resources/Insurance Housing Social Support  ADL's:  Intact  Cognition: WNL  Sleep:  Fair   Screenings: PHQ2-9     Office Visit from 06/17/2020 in [place] at [facility] Visit from 03/14/2020 in [place] at [facility] Visit  from 09/11/2019 in [place] Office Visit from 10/25/2015 in [facility] at [facility] Total Score 1 1 1 3   PHQ-9 Total Score 11 -- -- 18       Assessment and Plan: 69yo female with chronic anxiety/depression which worsened with increased physical problems/pain. She has been for a long time on Cymbalta - we have cross tapered it to sertraline which she toleratedwell and dose has been increased to 150 mg as she continued to reports feeling anxious and depressed.She was taken off  clonazepam out of concern about combining benzodiazepines with narcotic analgesics and instead buspirone was added. She did not find it very effective.We have added a low dose of aripiprazole to augment sertraline and Denise King reported that her mood started to improve.While in the hospital in December she was taken off hydroxyzine - she takes melatonin now and her sleep is adequate.She was restarted on alprazolam low dose as needed for anxietyin March (by GI) but her pain specialist has been concerned about likely interaction with opioid analgesics she is on.Denise King reports falling in early July, broke sternum (although chart indicated that it was a result of CPR) and several of her medications were stopped at that time (including alprazolam, buspirone, aripiprazole and bupropion). She remains on oxycodone and MS Contin for chronic pain. She reports that her mood has been fairly stable recently: back on sertyraline and burpopion.  VA:NVBTYOMAY disorder;GAD;Chronic pain disorder  Plan:ContinueZoloft200 mg at HS and Wellbutrin SR 150 mg in am. Next appointment in3 months. The plan was discussed with patient who had an opportunity to ask questions and these were all answered. I spend63minutes inphone consultation with the patient.   Stephanie Acre, MD 10/21/2020, 2:41 PM

## 2020-10-22 ENCOUNTER — Other Ambulatory Visit: Payer: Self-pay

## 2020-10-22 ENCOUNTER — Inpatient Hospital Stay: Payer: Medicare HMO

## 2020-10-22 ENCOUNTER — Encounter: Payer: Self-pay | Admitting: Internal Medicine

## 2020-10-22 ENCOUNTER — Inpatient Hospital Stay: Payer: Medicare HMO | Attending: Internal Medicine

## 2020-10-22 ENCOUNTER — Inpatient Hospital Stay (HOSPITAL_BASED_OUTPATIENT_CLINIC_OR_DEPARTMENT_OTHER): Payer: Medicare HMO | Admitting: Internal Medicine

## 2020-10-22 VITALS — BP 114/77 | HR 14 | Temp 98.5°F | Resp 16 | Ht 61.0 in | Wt 115.0 lb

## 2020-10-22 DIAGNOSIS — Z9049 Acquired absence of other specified parts of digestive tract: Secondary | ICD-10-CM | POA: Insufficient documentation

## 2020-10-22 DIAGNOSIS — Z7982 Long term (current) use of aspirin: Secondary | ICD-10-CM | POA: Insufficient documentation

## 2020-10-22 DIAGNOSIS — E538 Deficiency of other specified B group vitamins: Secondary | ICD-10-CM

## 2020-10-22 DIAGNOSIS — D751 Secondary polycythemia: Secondary | ICD-10-CM

## 2020-10-22 DIAGNOSIS — F17210 Nicotine dependence, cigarettes, uncomplicated: Secondary | ICD-10-CM | POA: Insufficient documentation

## 2020-10-22 DIAGNOSIS — Z7952 Long term (current) use of systemic steroids: Secondary | ICD-10-CM | POA: Insufficient documentation

## 2020-10-22 DIAGNOSIS — I509 Heart failure, unspecified: Secondary | ICD-10-CM | POA: Insufficient documentation

## 2020-10-22 DIAGNOSIS — Z79899 Other long term (current) drug therapy: Secondary | ICD-10-CM | POA: Insufficient documentation

## 2020-10-22 DIAGNOSIS — Z8249 Family history of ischemic heart disease and other diseases of the circulatory system: Secondary | ICD-10-CM | POA: Insufficient documentation

## 2020-10-22 DIAGNOSIS — R69 Illness, unspecified: Secondary | ICD-10-CM | POA: Diagnosis not present

## 2020-10-22 LAB — CBC WITH DIFFERENTIAL/PLATELET
Abs Immature Granulocytes: 0.01 10*3/uL (ref 0.00–0.07)
Basophils Absolute: 0 10*3/uL (ref 0.0–0.1)
Basophils Relative: 1 %
Eosinophils Absolute: 0.1 10*3/uL (ref 0.0–0.5)
Eosinophils Relative: 1 %
HCT: 33.7 % — ABNORMAL LOW (ref 36.0–46.0)
Hemoglobin: 11.2 g/dL — ABNORMAL LOW (ref 12.0–15.0)
Immature Granulocytes: 0 %
Lymphocytes Relative: 50 %
Lymphs Abs: 4.3 10*3/uL — ABNORMAL HIGH (ref 0.7–4.0)
MCH: 31.1 pg (ref 26.0–34.0)
MCHC: 33.2 g/dL (ref 30.0–36.0)
MCV: 93.6 fL (ref 80.0–100.0)
Monocytes Absolute: 0.7 10*3/uL (ref 0.1–1.0)
Monocytes Relative: 9 %
Neutro Abs: 3.4 10*3/uL (ref 1.7–7.7)
Neutrophils Relative %: 39 %
Platelets: 290 10*3/uL (ref 150–400)
RBC: 3.6 MIL/uL — ABNORMAL LOW (ref 3.87–5.11)
RDW: 11.9 % (ref 11.5–15.5)
WBC: 8.5 10*3/uL (ref 4.0–10.5)
nRBC: 0 % (ref 0.0–0.2)

## 2020-10-22 LAB — BASIC METABOLIC PANEL
Anion gap: 6 (ref 5–15)
BUN: 14 mg/dL (ref 8–23)
CO2: 28 mmol/L (ref 22–32)
Calcium: 8.2 mg/dL — ABNORMAL LOW (ref 8.9–10.3)
Chloride: 103 mmol/L (ref 98–111)
Creatinine, Ser: 0.68 mg/dL (ref 0.44–1.00)
GFR, Estimated: >60 mL/min (ref >60)
Glucose, Bld: 123 mg/dL — ABNORMAL HIGH (ref 70–99)
Potassium: 4.5 mmol/L (ref 3.5–5.1)
Sodium: 137 mmol/L (ref 135–145)

## 2020-10-22 NOTE — Assessment & Plan Note (Addendum)
#   Erythrocytosis-isolated hematocrit/hemoglobin-elevated >10 years. Suspect secondary causes [smoking]; rather than primary [jak-2 NEG/Normal EPO; JAK2 exon 12- NEGATIVE.   #Hemoglobin today is 11.5; [likely from recent hospitalization];hematocrit normal.  No phlebotomy.  As the patient is motivated/leaning towards quitting smoking.  I think it for psychosis should resolve.  Patient should need any further phlebotomies.  #Hypocalcemia mild-8.2; recommend compliance with calcium + Vit D>   # Active smoker- counselled to quit smoking; in process of quitting. Discussed regarding lung cancer screening program at length; which includes low-dose CT scan on annual basis for 5 years.  Based upon the findings patient would be recommended surveillance/biopsies/or surgery.  Lung cancer program has shown to save lives by early detection of lung cancer.  Patient interested in pursuing the lung cancer screening program.  Patient's friend recently died of lung cancer.  *print out of labs from today # DISPOSITION:  # NO phlebotomy # Follow up as needed- Dr.B  Cc; Shawn Prkins

## 2020-10-22 NOTE — Progress Notes (Signed)
Pt states since she was last seen she was dx with CHF in July.

## 2020-10-22 NOTE — Progress Notes (Signed)
[facility] NOTE  Patient Care Team: Biagio Borg, MD as PCP - General (Internal Medicine) Josue Hector, MD as PCP - Cardiology (Cardiology)  CHIEF COMPLAINTS/PURPOSE OF CONSULTATION: Erythrocytosis  #  ERYTHROCYTOSIS Denise King since 2009]-October 2019-Jak 2/erythropoietin level-normal.   # Hx of Pernicious anemia  # Smoker-active smoker- declines LCSP  Oncology History   No history exists.     HISTORY OF PRESENTING ILLNESS:  Denise King 69 y.o.  female history of erythrocytosis thought to be secondary is here for follow-up.  In the interim patient had a complicated hospital course when she was found to have acute respiratory failure [?  Aspiration]/also needing CPR.  She also diagnosed with congestive heart failure.  Currently clinically stable.  Mild to moderate fatigue but otherwise no headaches or nausea vomiting.  Review of Systems  Constitutional: Positive for malaise/fatigue. Negative for chills, diaphoresis, fever and weight loss.  HENT: Negative for nosebleeds and sore throat.   Eyes: Negative for double vision.  Respiratory: Positive for cough and shortness of breath. Negative for hemoptysis, sputum production and wheezing.   Cardiovascular: Negative for chest pain, palpitations, orthopnea and leg swelling.  Gastrointestinal: Negative for abdominal pain, blood in stool, constipation, diarrhea, heartburn, melena, nausea and vomiting.  Genitourinary: Negative for dysuria, frequency and urgency.  Musculoskeletal: Positive for back pain and joint pain.  Skin: Negative.  Negative for itching and rash.  Neurological: Negative for dizziness, tingling, focal weakness, weakness and headaches.  Endo/Heme/Allergies: Does not bruise/bleed easily.  Psychiatric/Behavioral: Negative for depression. The patient is not nervous/anxious and does not have insomnia.      MEDICAL HISTORY:  Past Medical History:  Diagnosis Date  . Acute cystitis   .  Acute respiratory failure ([place])   . Allergy    as a child grew out of them  . Anemia    pernicious anemia  . Anxiety   . Arthritis   . Back pain   . Blood transfusion   . CAP (community acquired pneumonia)   . CHF (congestive heart failure) ([place])   . Chronic pain syndrome   . Common bile duct stone   . Depression   . Diverticulosis of colon   . Duodenal ulcer hemorrhagic-after biliary sphincterotomy   . Dysthymia   . Esophagitis   . EtOH dependence ([place])   . Gastritis   . GERD (gastroesophageal reflux disease)   . H/O chest pain Dec. 2013   no work up done  . Hx of colonic polyps   . Hypertension   . Irritable bowel syndrome   . Lymphocytic colitis 02/16/2020  . Osteopenia   . Osteoporosis   . Pneumonia 2019  . Polypharmacy   . Reflux esophagitis   . Right sided sciatica 12/22/2017  . Tobacco use disorder   . Unspecified chronic bronchitis ([place])   . Vertigo   . Vitamin B12 deficiency   . Wears glasses     SURGICAL HISTORY: Past Surgical History:  Procedure Laterality Date  . APPENDECTOMY    . BACK SURGERY  10-09   Dr. Patrice Paradise  . BIOPSY  02/14/2020   Procedure: BIOPSY;  Surgeon: Gatha Mayer, MD;  Location: [facility];  Service: Endoscopy;;  . CARPAL TUNNEL RELEASE Right 08/14/2014   Procedure: RIGHT CARPAL TUNNEL RELEASE AND INJECT LEFT THUMB;  Surgeon: Daryll Brod, MD;  Location: [place];  Service: Orthopedics;  Laterality: Right;  . CHOLECYSTECTOMY N/A 02/22/2020   Procedure: LAPAROSCOPIC CHOLECYSTECTOMY WITH INTRAOPERATIVE CHOLANGIOGRAM;  Surgeon: Kieth Brightly, Arta Bruce, MD;  Location: [facility];  Service: General;  Laterality: N/A;  . COLONOSCOPY    . COLONOSCOPY WITH PROPOFOL N/A 02/14/2020   Procedure: COLONOSCOPY WITH PROPOFOL;  Surgeon: Gatha Mayer, MD;  Location: [facility];  Service: Endoscopy;  Laterality: N/A;  . ENDOSCOPIC RETROGRADE CHOLANGIOPANCREATOGRAPHY (ERCP) WITH PROPOFOL N/A 12/25/2019   Procedure: ENDOSCOPIC RETROGRADE  CHOLANGIOPANCREATOGRAPHY (ERCP) WITH PROPOFOL;  Surgeon: Gatha Mayer, MD;  Location: [facility];  Service: Gastroenterology;  Laterality: N/A;  . ESOPHAGOGASTRODUODENOSCOPY (EGD) WITH PROPOFOL N/A 01/06/2020   Procedure: ESOPHAGOGASTRODUODENOSCOPY (EGD) WITH PROPOFOL;  Surgeon: Irene Shipper, MD;  Location: [facility];  Service: Endoscopy;  Laterality: N/A;  . HOT HEMOSTASIS N/A 01/06/2020   Procedure: HOT HEMOSTASIS (ARGON PLASMA COAGULATION/BICAP);  Surgeon: Irene Shipper, MD;  Location: [facility] ENDOSCOPY;  Service: Endoscopy;  Laterality: N/A;  . INTRAMEDULLARY (IM) NAIL INTERTROCHANTERIC Right 10/01/2018   Procedure: INTRAMEDULLARY (IM) NAIL INTERTROCHANTRIC;  Surgeon: Thornton Park, MD;  Location: [facility] ORS;  Service: Orthopedics;  Laterality: Right;  . LAPAROSCOPY N/A 02/21/2013   Procedure: LAPAROSCOPY OPERATIVE;  Surgeon: Margarette Asal, MD;  Location: [facility];  Service: Gynecology;  Laterality: N/A;  REQUESTING 5MM SCOPE WITH CAMERA  . LEFT HEART CATH AND CORONARY ANGIOGRAPHY N/A 05/11/2019   Procedure: LEFT HEART CATH AND CORONARY ANGIOGRAPHY;  Surgeon: Sherren Mocha, MD;  Location: [facility];  Service: Cardiovascular;  Laterality: N/A;  . REMOVAL OF STONES  12/25/2019   Procedure: Karlyn Agee;  Surgeon: Gatha Mayer, MD;  Location: [facility] ENDOSCOPY;  Service: Gastroenterology;;  . Clide Deutscher  01/06/2020   Procedure: Clide Deutscher;  Surgeon: Irene Shipper, MD;  Location: [facility] ENDOSCOPY;  Service: Endoscopy;;  . Joan Mayans  12/25/2019   Procedure: SPHINCTEROTOMY;  Surgeon: Gatha Mayer, MD;  Location: [place];  Service: Gastroenterology;;  . TONSILLECTOMY    . TUBAL LIGATION    . UPPER GASTROINTESTINAL ENDOSCOPY      SOCIAL HISTORY: Social History   Socioeconomic History  . Marital status: Married    Spouse name: Not on file  . Number of children: 4  . Years of education: 42  . Highest education level: Not on file  Occupational History  . Occupation:  retired    Comment: disablility  Tobacco Use  . Smoking status: Current Every Day Smoker    Packs/day: 1.00    Years: 30.00    Pack years: 30.00    Types: Cigarettes  . Smokeless tobacco: Never Used  Vaping Use  . Vaping Use: Never used  Substance and Sexual Activity  . Alcohol use: Yes    Alcohol/week: 3.0 standard drinks    Types: 3 Standard drinks or equivalent per week    Comment: occasional  . Drug use: No  . Sexual activity: Yes  Other Topics Concern  . Not on file  Social History Narrative   Married x 38 yrs. Has BS degree in Horticulture from [employer]. Currently resides in a house with her husband. 3 cats.  Fun: used to like to do a lot of things.    Denies religious beliefs that would effect health care.    lives with husband [name]; quit smoking- October 6th, 2019. ocasional alcohol. [employer] [2009] on disability sec to back issues.    Social Determinants of Health   Financial Resource Strain:   . Difficulty of Paying Living Expenses: Not on file  Food Insecurity:   . Worried About Charity fundraiser in the Last Year: Not on  file  . [place] in the Last Year: Not on file  Transportation Needs: No Transportation Needs  . Lack of Transportation (Medical): No  . Lack of Transportation (Non-Medical): No  Physical Activity:   . Days of Exercise per Week: Not on file  . Minutes of Exercise per Session: Not on file  Stress:   . Feeling of Stress : Not on file  Social Connections: Moderately Isolated  . Frequency of Communication with Friends and Family: More than three times a week  . Frequency of Social Gatherings with Friends and Family: Once a week  . Attends Religious Services: Never  . Active Member of Clubs or Organizations: No  . Attends Archivist Meetings: Never  . Marital Status: Married  Human resources officer Violence:   . Fear of Current or Ex-Partner: Not on file  . Emotionally Abused: Not on file  . Physically Abused: Not  on file  . Sexually Abused: Not on file    FAMILY HISTORY: Family History  Problem Relation Age of Onset  . Colon cancer Father   . Prostate cancer Father   . Heart disease Mother        prev MVR, also has DJD  . Colon cancer Brother   . Skin cancer Sister   . Alcohol abuse Daughter   . Bipolar disorder Daughter   . Drug abuse Daughter   . Esophageal cancer Neg Hx   . Rectal cancer Neg Hx   . Stomach cancer Neg Hx     ALLERGIES:  is allergic to atorvastatin, levofloxacin, omnicef [cefdinir], trazodone and nefazodone, sulfa antibiotics, and sulfonamide derivatives.  MEDICATIONS:  Current Outpatient Medications  Medication Sig Dispense Refill  . aspirin EC 81 MG tablet Take 1 tablet (81 mg total) by mouth daily.    . budesonide (ENTOCORT EC) 3 MG 24 hr capsule TAKE 3 CAPSULES ONCE DAILY. (Patient taking differently: Take 9 mg by mouth daily. ) 90 capsule 1  . buPROPion (WELLBUTRIN SR) 150 MG 12 hr tablet Take 1 tablet (150 mg total) by mouth daily. 90 tablet 1  . Cholecalciferol (VITAMIN D) 50 MCG (2000 UT) tablet Take 2,000 Units by mouth daily after breakfast.     . denosumab (PROLIA) 60 MG/ML SOSY injection Inject 60 mg into the skin every 6 (six) months.    . Diphenoxylate-Atropine (LOMOTIL PO) Lomotil  1 po qid prn    . folic acid (FOLVITE) 1 MG tablet Take 1 tablet (1 mg total) by mouth daily. (Patient taking differently: Take 1 mg by mouth daily after breakfast. ) 90 tablet 3  . gabapentin (NEURONTIN) 300 MG capsule Take 300 mg by mouth 3 (three) times daily.    . hyoscyamine (ANASPAZ) 0.125 MG TBDP disintergrating tablet Place 0.125 mg under the tongue every 6 (six) hours as needed ([name] cramps).     . morphine (MS CONTIN) 15 MG 12 hr tablet Take 15 mg by mouth 3 (three) times daily.    . Multiple Vitamin (MULTIVITAMIN WITH MINERALS) TABS tablet Take 1 tablet by mouth daily. 30 tablet 0  . nicotine (NICODERM CQ - DOSED IN MG/24 HOURS) 21 mg/24hr patch Place 1 patch (21 mg  total) onto the skin daily. 28 patch 0  . nitroGLYCERIN (NITROSTAT) 0.4 MG SL tablet Place 1 tablet (0.4 mg total) under the tongue every 5 (five) minutes as needed for chest pain. 25 tablet 3  . ondansetron (ZOFRAN-ODT) 4 MG disintegrating tablet DISSOLVE 1 TABLET ON TONGUE EVERY 8  HOURS AS NEEDED FOR NAUSEA/VOMITING. 15 tablet 0  . Oxycodone HCl 10 MG TABS Take 10 mg by mouth 4 (four) times daily.     . pantoprazole (PROTONIX) 40 MG tablet TAKE (1) TABLET TWICE A DAY BEFORE MEALS. (Patient taking differently: Take 40 mg by mouth 2 (two) times daily with a meal. ) 60 tablet 4  . potassium chloride (MICRO-K) 10 MEQ CR capsule Take 1 capsule (10 mEq total) by mouth 2 (two) times daily. 90 capsule 3  . rosuvastatin (CRESTOR) 5 MG tablet Take 1 tablet (5 mg total) by mouth 3 (three) times a week. 39 tablet 3  . [START ON 11/15/2020] sertraline (ZOLOFT) 100 MG tablet Take 2 tablets (200 mg total) by mouth at bedtime. 180 tablet 1  . spironolactone (ALDACTONE) 25 MG tablet Take 0.5 tablets (12.5 mg total) by mouth daily. 45 tablet 3  . valACYclovir (VALTREX) 500 MG tablet Take 500 mg by mouth 2 (two) times daily as needed (flare ups).     . vitamin B-12 (CYANOCOBALAMIN) 100 MCG tablet Take 1 tablet (100 mcg total) by mouth daily. (Patient taking differently: Take 100 mcg by mouth daily after breakfast. ) 90 tablet 3   No current facility-administered medications for this visit.      Marland Kitchen  PHYSICAL EXAMINATION:  Vitals:   10/22/20 1334  BP: 114/77  Pulse: (!) 14  Resp: 16  Temp: 98.5 F (36.9 C)  SpO2: 97%   Filed Weights   10/22/20 1334  Weight: 115 lb (52.2 kg)    Physical Exam Constitutional:      Comments: Thin built Caucasian female patient.  Ambulating independently.  HENT:     Head: Normocephalic and atraumatic.     Mouth/Throat:     Pharynx: No oropharyngeal exudate.  Eyes:     Pupils: Pupils are equal, round, and reactive to light.  Cardiovascular:     Rate and Rhythm:  Normal rate and regular rhythm.  Pulmonary:     Effort: No respiratory distress.     Breath sounds: No wheezing.     Comments: Decreased air entry bilaterally.  No wheeze or crackles. Abdominal:     General: Bowel sounds are normal. There is no distension.     Palpations: Abdomen is soft. There is no mass.     Tenderness: There is no abdominal tenderness. There is no guarding or rebound.  Musculoskeletal:        General: No tenderness. Normal range of motion.     Cervical back: Normal range of motion and neck supple.  Skin:    General: Skin is warm.  Neurological:     Mental Status: She is alert and oriented to person, place, and time.  Psychiatric:        Mood and Affect: Affect normal.      LABORATORY DATA:  I have reviewed the data as listed Lab Results  Component Value Date   WBC 8.5 10/22/2020   HGB 11.2 (L) 10/22/2020   HCT 33.7 (L) 10/22/2020   MCV 93.6 10/22/2020   PLT 290 10/22/2020   Recent Labs    12/04/19 1117 12/23/19 0948 05/16/20 0815 06/11/20 1250 07/04/20 1501 07/04/20 1518 07/06/20 1124 07/06/20 2127 07/07/20 0334 07/07/20 0434 07/09/20 0955 07/09/20 0955 07/10/20 0930 07/25/20 1456 10/22/20 1331  NA  --    < > 139   < > 136   < > 138   < > 138   < > 134*   < > 136 140  137  K  --    < > 4.1   < > 4.9   < > 3.2*   < > 2.9*   < > 3.8   < > 3.3* 4.0 4.5  CL  --    < > 98   < > 98   < > 87*   < > 86*   < > 92*   < > 96* 105 103  CO2  --    < > 26   < > 22   < > 39*   < > 37*   < > 28   < > 26 29 28   GLUCOSE  --    < > 90   < > 117*   < > 129*   < > 138*   < > 87   < > 124* 79 123*  BUN  --    < > 8   < > 13   < > 20   < > 22   < > 28*   < > 27* 12 14  CREATININE  --    < > 0.76   < > 1.99*   < > 0.99   < > 0.93   < > 0.97   < > 0.87 0.84 0.68  CALCIUM  --    < > 9.3   < > 8.4*   < > 8.1*   < > 8.5*   < > 9.7   < > 9.7 9.0 8.2*  GFRNONAA  --    < > 82   < > 25*   < > 59*   < > >60   < > >60   < > >60 72 >60  GFRAA  --    < > 95   < > 29*   < >  >60   < > >60   < > >60  --  >60 83  --   PROT 6.5   < > 7.0   < > 6.6   < > 6.5  --  7.0  --   --   --   --  6.3  --   ALBUMIN 3.9   < > 4.4   < > 3.7  --  3.3*  --  3.4*  --   --   --   --   --   --   AST 20   < > 24   < > 76*   < > 169*  --  196*  --   --   --   --  13  --   ALT 6   < > 10   < > 24   < > 136*  --  164*  --   --   --   --  7  --   ALKPHOS 94   < > 59   < > 63  --  56  --  58  --   --   --   --   --   --   BILITOT 0.4   < > 0.4   < > 1.4*   < > 0.7  --  0.9  --   --   --   --  0.4  --   BILIDIR 0.11  --  0.16  --   --   --   --   --   --   --   --   --   --   --   --    < > =  values in this interval not displayed.    RADIOGRAPHIC STUDIES: I have personally reviewed the radiological images as listed and agreed with the findings in the report. No results found.  ASSESSMENT & PLAN:   Erythrocytosis # Erythrocytosis-isolated hematocrit/hemoglobin-elevated >10 years. Suspect secondary causes [smoking]; rather than primary [jak-2 NEG/Normal EPO; JAK2 exon 12- NEGATIVE.   #Hemoglobin today is 11.5; [likely from recent hospitalization];hematocrit normal.  No phlebotomy.  As the patient is motivated/leaning towards quitting smoking.  I think it for psychosis should resolve.  Patient should need any further phlebotomies.  #Hypocalcemia mild-8.2; recommend compliance with calcium + Vit D>   # Active smoker- counselled to quit smoking; in process of quitting. Discussed regarding lung cancer screening program at length; which includes low-dose CT scan on annual basis for 5 years.  Based upon the findings patient would be recommended surveillance/biopsies/or surgery.  Lung cancer program has shown to save lives by early detection of lung cancer.  Patient interested in pursuing the lung cancer screening program.  Patient's friend recently died of lung cancer.  *print out of labs from today # DISPOSITION:  # NO phlebotomy # Follow up as needed- Dr.B  Cc; Shawn Prkins      All  questions were answered. The patient knows to call the clinic with any problems, questions or concerns.    Cammie Sickle, MD 10/22/2020 2:08 PM

## 2020-10-24 IMAGING — CT CT HIP*R* W/O CM
1 series · 15 of 32 positions shown, 19 images · non-contrast
Comparison: Radiographs 10/01/2018.

CLINICAL DATA: Persistent hip pain and difficulty walking. History
of right hip fracture with ORIF 10/01/2018.

EXAM:
CT OF THE RIGHT HIP WITHOUT CONTRAST
TECHNIQUE: Multidetector CT imaging of the right hip was performed according to
the standard protocol. Multiplanar CT image reconstructions were
also generated.

[Series 8: axial soft tissue · axial · 0.24mm/px · z∈[-289,-11]mm · 15 of 154 slices shown, 19 images]
[im 10/154  soft-tissue]
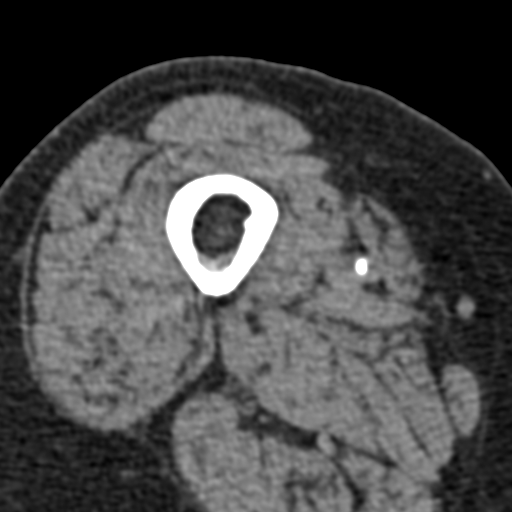
[im 10/154  bone]
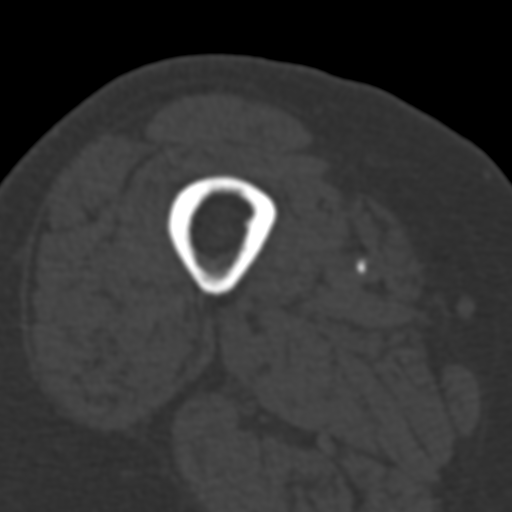
[im 20/154  soft-tissue]
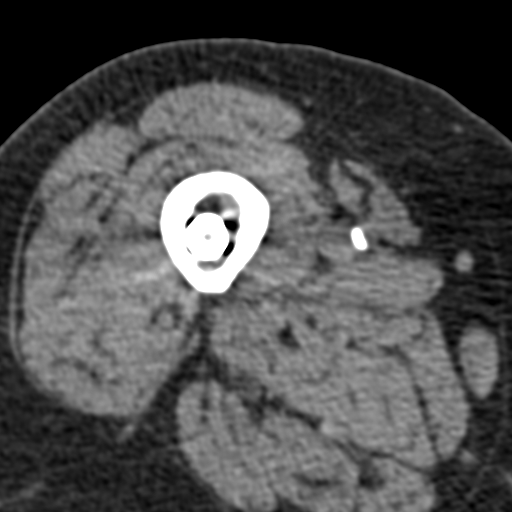
[im 30/154  soft-tissue]
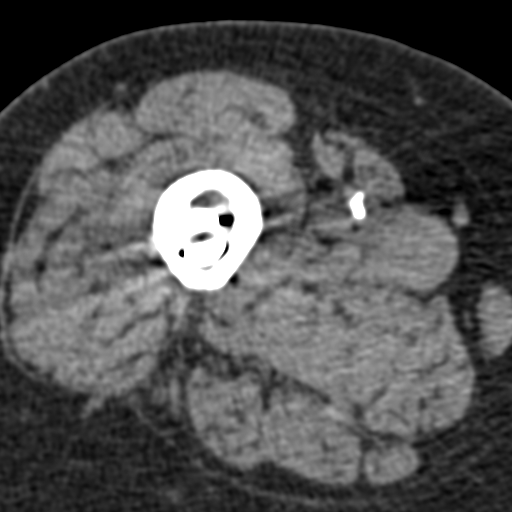
[im 45/154  soft-tissue]
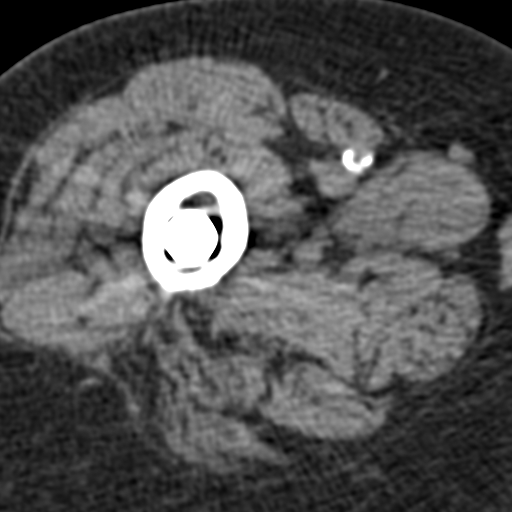
[im 55/154  soft-tissue]
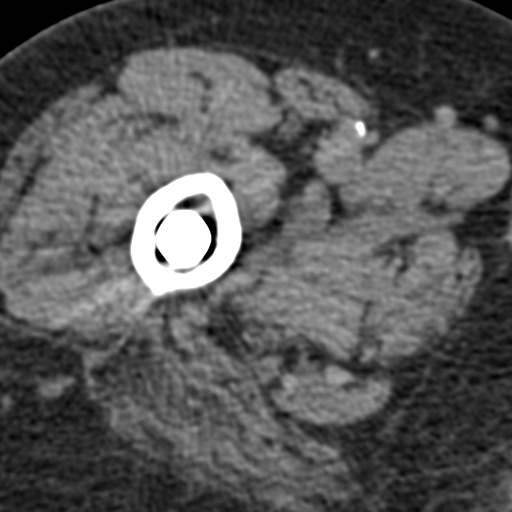
[im 65/154  soft-tissue]
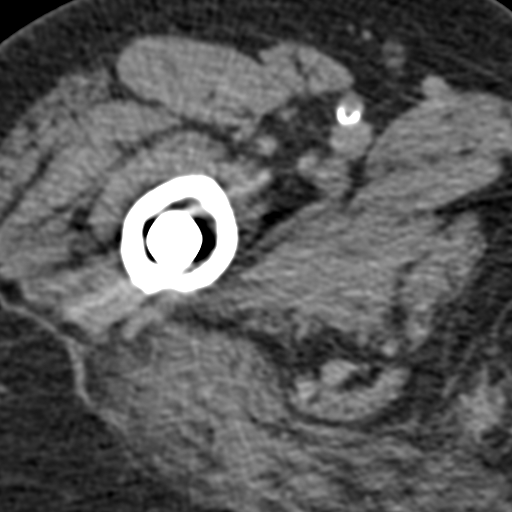
[im 79/154  soft-tissue]
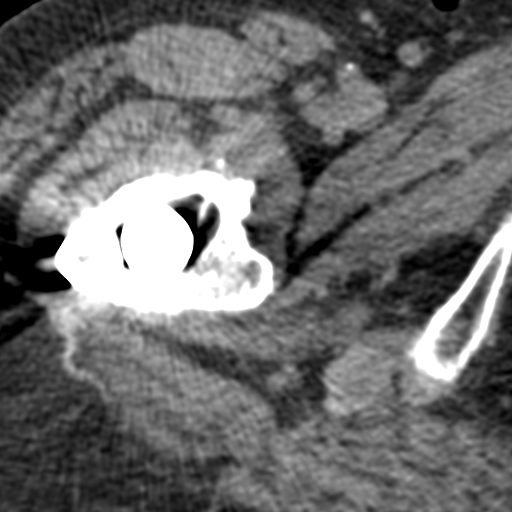
[im 89/154  soft-tissue]
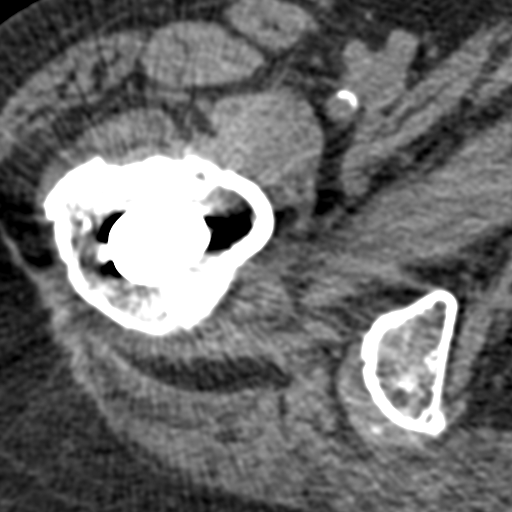
[im 99/154  soft-tissue]
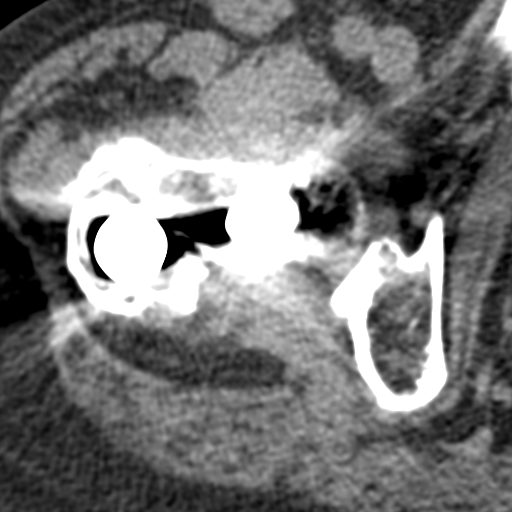
[im 99/154  bone]
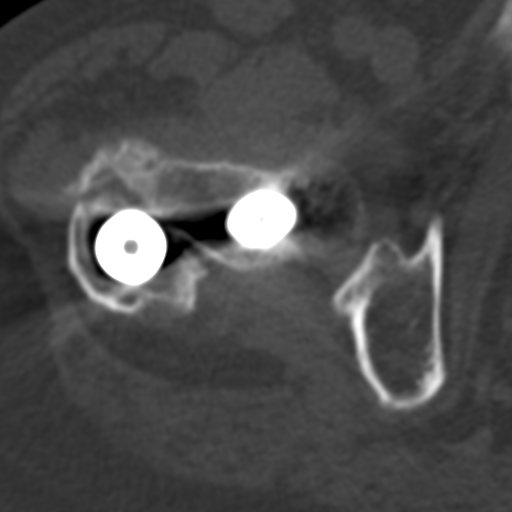
[im 109/154  soft-tissue]
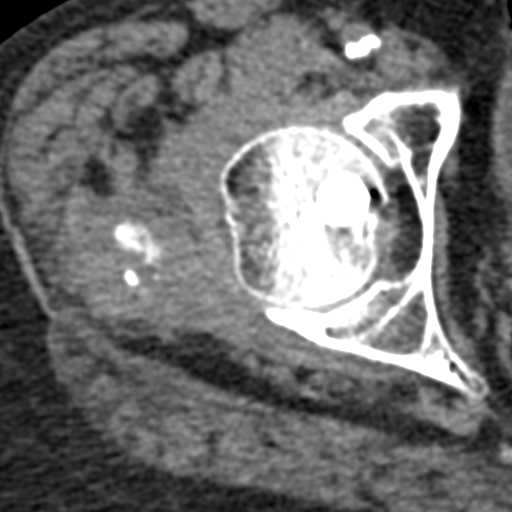
[im 124/154  soft-tissue]
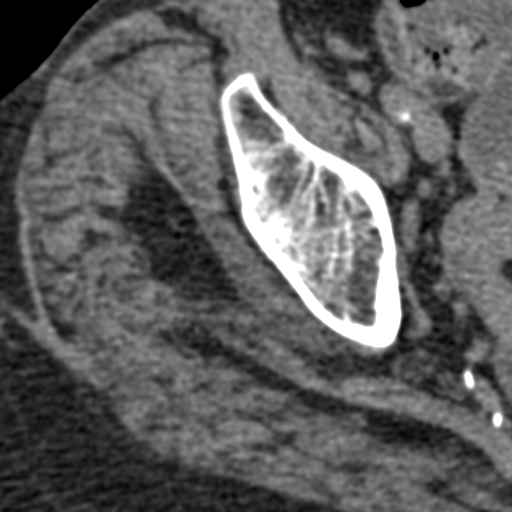
[im 134/154  soft-tissue]
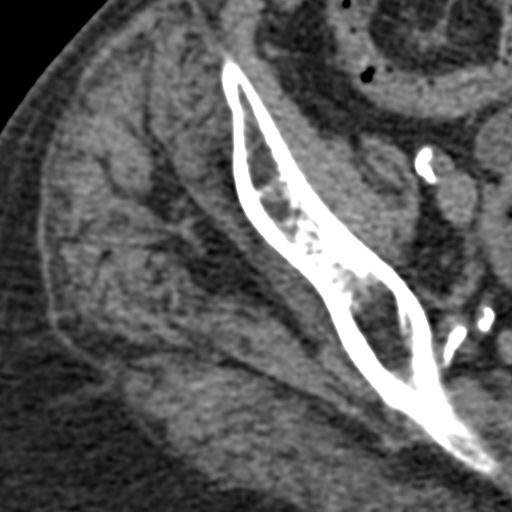
[im 134/154  lung]
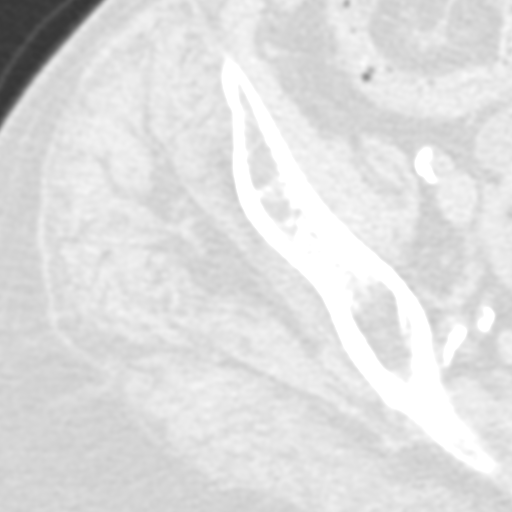
[im 139/154  lung]
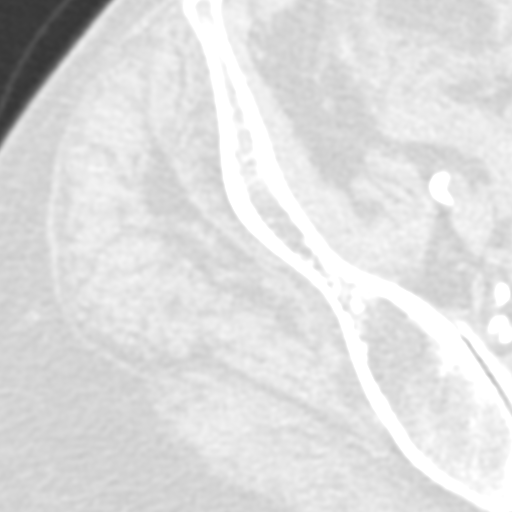
[im 144/154  soft-tissue]
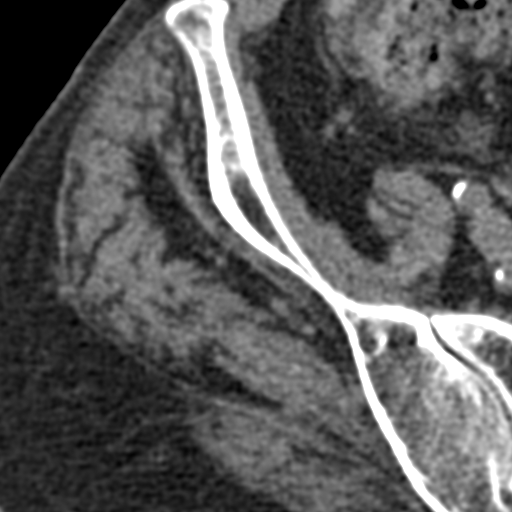
[im 144/154  lung]
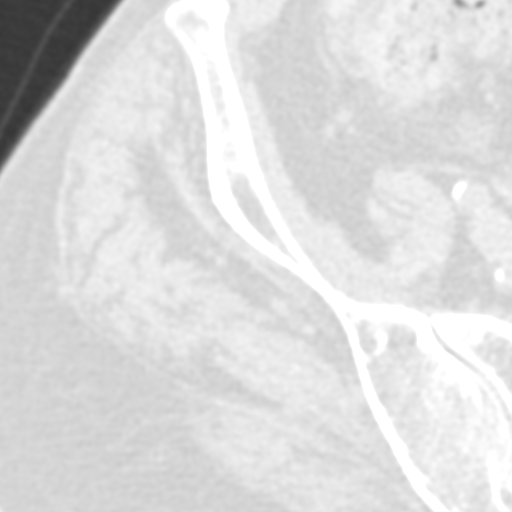
[im 149/154  lung]
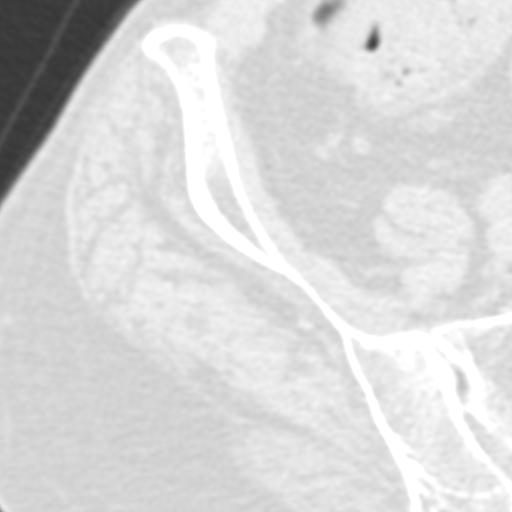

[15 of 32 positions shown; findings below may reference images not displayed]

FINDINGS: Bones/Joint/Cartilage

Status post dynamic compression screw fixation of an
intertrochanteric right femur fracture. The hardware is well
positioned without loosening. The fracture demonstrates near
anatomic reduction with interval partial healing. There are small
fracture fragments proximal to the greater trochanter. No evidence
of femoral head avascular necrosis, significant hip arthropathy or
hip joint effusion.

Ligaments

Not relevant for exam/indication.

Muscles and Tendons

Unremarkable.

Soft tissues

No periarticular hematoma or other significant fluid collections
identified. Iliofemoral atherosclerosis noted.
IMPRESSION: 1. No demonstrated complication following ORIF of intertrochanteric
right femur fracture. The hardware is well positioned, and there is
evidence of partial healing of the fracture.
2. No significant hip arthropathy or periarticular abnormalities.
3. Iliofemoral atherosclerosis.

## 2020-10-30 ENCOUNTER — Other Ambulatory Visit: Payer: Self-pay

## 2020-10-30 ENCOUNTER — Ambulatory Visit (INDEPENDENT_AMBULATORY_CARE_PROVIDER_SITE_OTHER)
Admission: RE | Admit: 2020-10-30 | Discharge: 2020-10-30 | Disposition: A | Discharge status: Home / Self Care | Payer: Medicare HMO | Source: Ambulatory Visit | Attending: Cardiovascular Disease | Admitting: Cardiovascular Disease

## 2020-10-30 DIAGNOSIS — I2510 Atherosclerotic heart disease of native coronary artery without angina pectoris: Secondary | ICD-10-CM | POA: Diagnosis not present

## 2020-10-30 DIAGNOSIS — R918 Other nonspecific abnormal finding of lung field: Secondary | ICD-10-CM | POA: Diagnosis not present

## 2020-10-30 DIAGNOSIS — S2220XA Unspecified fracture of sternum, initial encounter for closed fracture: Secondary | ICD-10-CM | POA: Diagnosis not present

## 2020-10-30 DIAGNOSIS — G894 Chronic pain syndrome: Secondary | ICD-10-CM | POA: Diagnosis not present

## 2020-10-30 DIAGNOSIS — M25551 Pain in right hip: Secondary | ICD-10-CM | POA: Diagnosis not present

## 2020-10-30 DIAGNOSIS — J432 Centrilobular emphysema: Secondary | ICD-10-CM | POA: Diagnosis not present

## 2020-10-30 DIAGNOSIS — R9389 Abnormal findings on diagnostic imaging of other specified body structures: Secondary | ICD-10-CM | POA: Diagnosis not present

## 2020-10-30 DIAGNOSIS — R0789 Other chest pain: Secondary | ICD-10-CM | POA: Diagnosis not present

## 2020-10-30 DIAGNOSIS — Z79891 Long term (current) use of opiate analgesic: Secondary | ICD-10-CM | POA: Diagnosis not present

## 2020-10-30 DIAGNOSIS — M47814 Spondylosis without myelopathy or radiculopathy, thoracic region: Secondary | ICD-10-CM | POA: Diagnosis not present

## 2020-11-03 DIAGNOSIS — I509 Heart failure, unspecified: Secondary | ICD-10-CM | POA: Diagnosis not present

## 2020-11-09 DIAGNOSIS — I509 Heart failure, unspecified: Secondary | ICD-10-CM | POA: Diagnosis not present

## 2020-11-12 ENCOUNTER — Ambulatory Visit: Payer: Medicare HMO | Admitting: Internal Medicine

## 2020-11-12 ENCOUNTER — Ambulatory Visit: Payer: Medicare HMO

## 2020-11-15 ENCOUNTER — Ambulatory Visit: Payer: Medicare HMO

## 2020-11-26 ENCOUNTER — Ambulatory Visit: Payer: Self-pay

## 2020-11-26 ENCOUNTER — Ambulatory Visit: Payer: Medicare HMO | Attending: Internal Medicine

## 2020-11-26 DIAGNOSIS — Z23 Encounter for immunization: Secondary | ICD-10-CM

## 2020-11-26 NOTE — Progress Notes (Signed)
   Covid-19 Vaccination Clinic  Name:  Denise King    MRN: [id number] DOB: March 28, 1951  11/26/2020  [name] was observed post Covid-19 immunization for 15 minutes without incident. She was provided with Vaccine Information Sheet and instruction to access the V-Safe system.   [name] was instructed to call 911 with any severe reactions post vaccine: Marland Kitchen Difficulty breathing  . Swelling of face and throat  . A fast heartbeat  . A bad rash all over body  . Dizziness and weakness   Immunizations Administered    Name Date Dose VIS Date Route   Pfizer COVID-19 Vaccine 11/26/2020  3:22 PM 0.3 mL 10/16/2020 Intramuscular   Manufacturer: [place]   Lot: [id number]   [place]: [id number]

## 2020-11-27 DIAGNOSIS — G894 Chronic pain syndrome: Secondary | ICD-10-CM | POA: Diagnosis not present

## 2020-11-27 DIAGNOSIS — R0789 Other chest pain: Secondary | ICD-10-CM | POA: Diagnosis not present

## 2020-11-27 DIAGNOSIS — M25551 Pain in right hip: Secondary | ICD-10-CM | POA: Diagnosis not present

## 2020-11-27 DIAGNOSIS — Z79891 Long term (current) use of opiate analgesic: Secondary | ICD-10-CM | POA: Diagnosis not present

## 2020-12-03 DIAGNOSIS — I509 Heart failure, unspecified: Secondary | ICD-10-CM | POA: Diagnosis not present

## 2020-12-09 DIAGNOSIS — I509 Heart failure, unspecified: Secondary | ICD-10-CM | POA: Diagnosis not present

## 2020-12-26 IMAGING — DX PORTABLE CHEST - 1 VIEW
1 series · 1 of 1 positions shown · non-contrast
Comparison: Chest x-ray dated 10/01/2018

CLINICAL DATA: Patient reports shortness of breath, non-productive
cough, fever onset yesterday and over the night.

EXAM:
PORTABLE CHEST 1 VIEW

[chest ap]
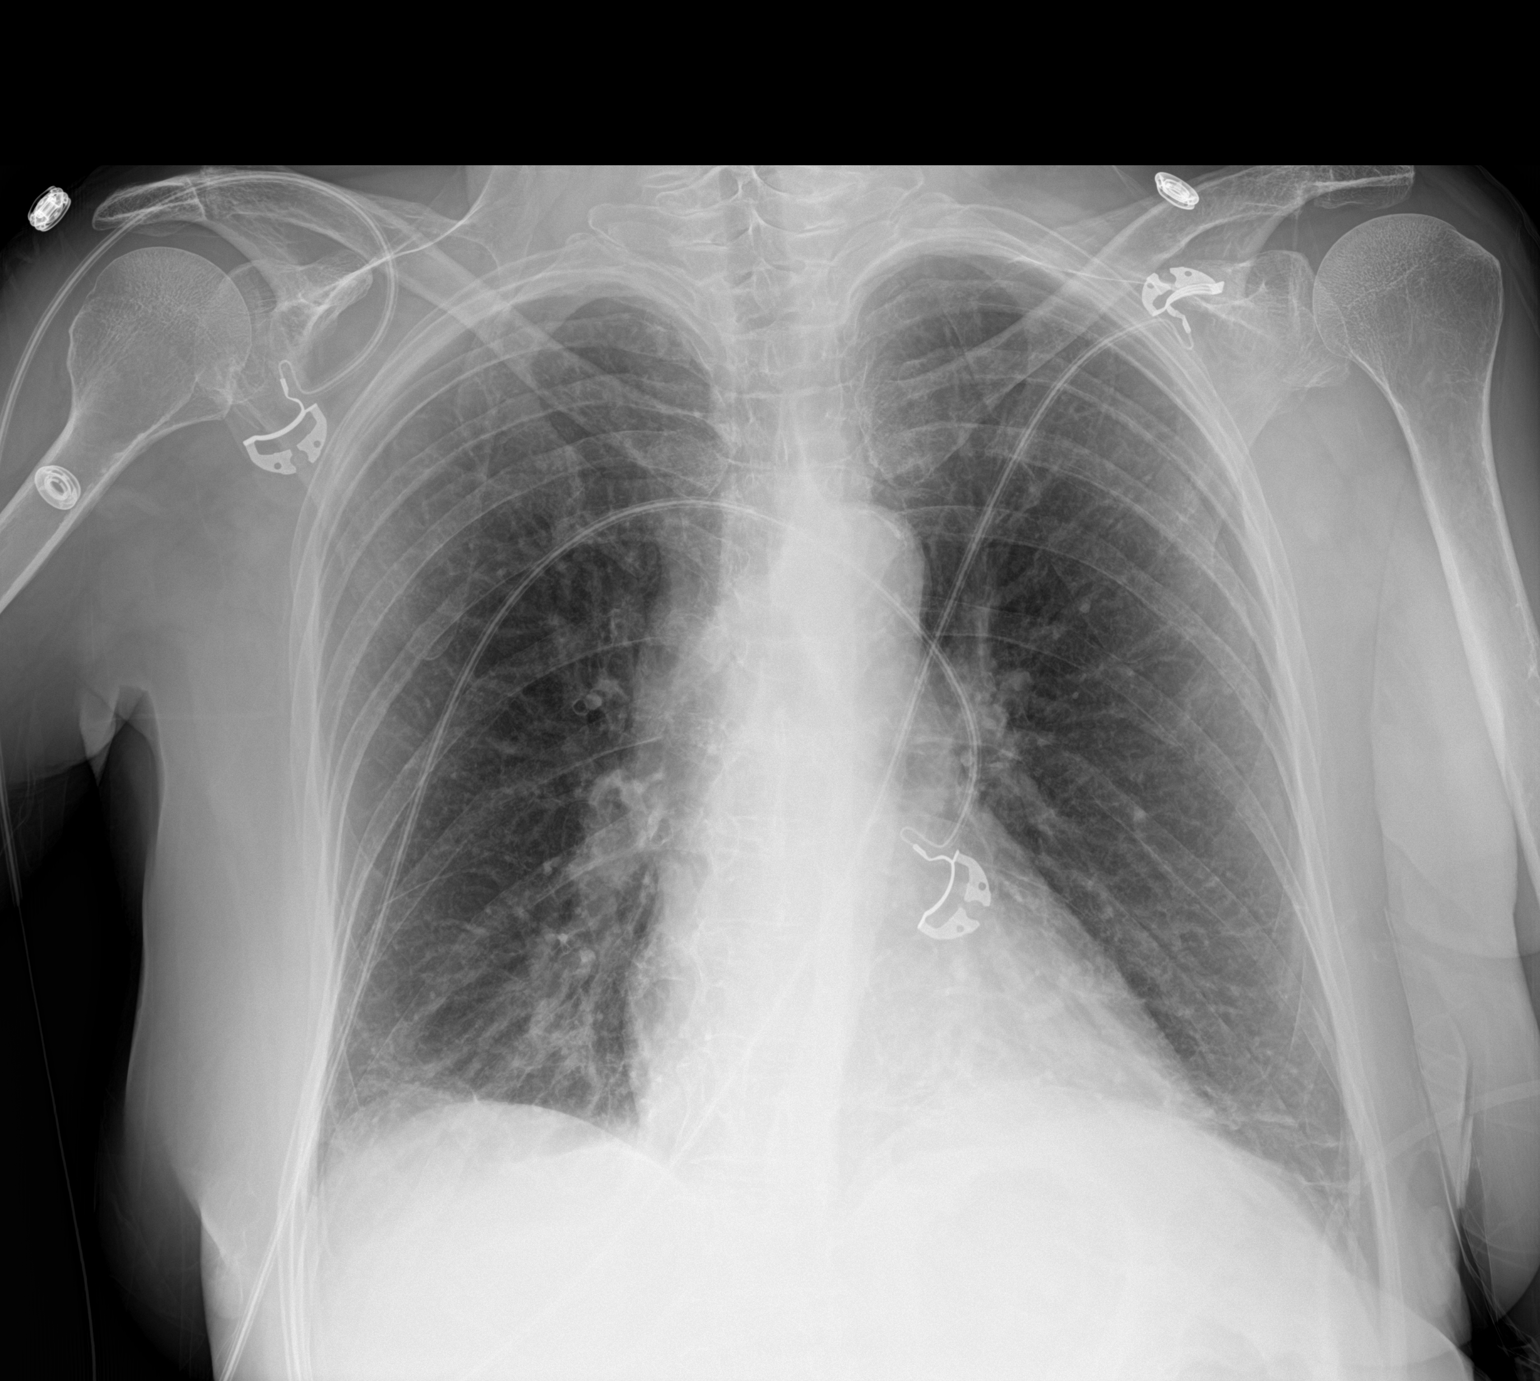

[1 of 1 positions shown; findings below may reference images not displayed]

FINDINGS: Heart size and mediastinal contours are within normal limits. Lungs
are clear. No pleural effusion or pneumothorax seen. Osseous
structures about the chest are unremarkable.
IMPRESSION: No active disease. No evidence of pneumonia or pulmonary edema.

## 2020-12-26 IMAGING — CT CT ANGIOGRAPHY CHEST
2 of 7 series · 19 of 46 positions shown · IV contrast (APPLIED)
Comparison: Chest radiograph earlier this day. Chest CT 05/13/2018

CLINICAL DATA: Shortness of breath. Cough. Fever. PE suspected,
intermediate prob, neg D-dimer

EXAM:
CT ANGIOGRAPHY CHEST WITH CONTRAST
TECHNIQUE: Multidetector CT imaging of the chest was performed using the
standard protocol during bolus administration of intravenous
contrast. Multiplanar CT image reconstructions and MIPs were
obtained to evaluate the vascular anatomy.
CONTRAST:  100mL OMNIPAQUE IOHEXOL 350 MG/ML SOLN

[Series 7: thins · axial · 0.64mm/px · z∈[+1180,+1406]mm · 16 of 366 slices shown]
[im 21/366  lung]
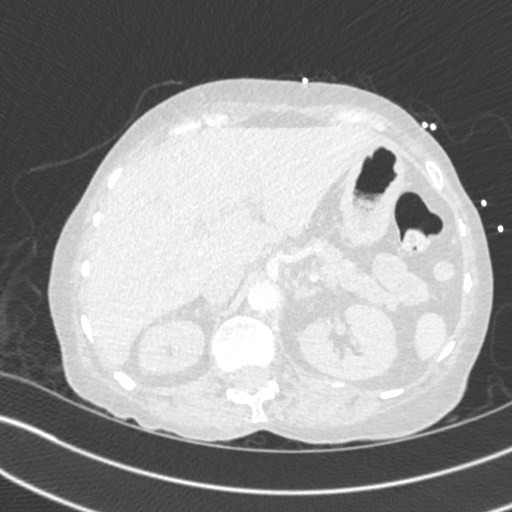
[im 41/366  soft-tissue]
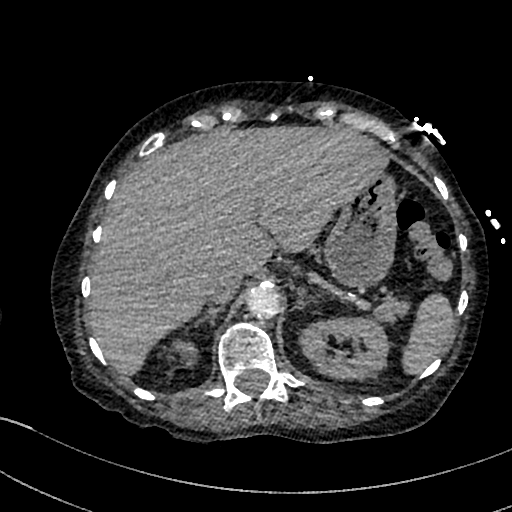
[im 61/366  lung]
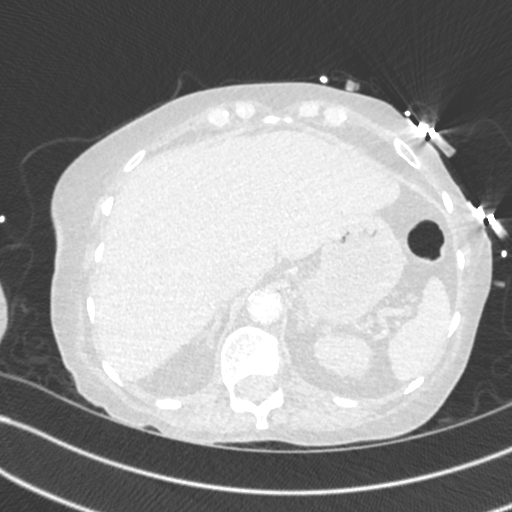
[im 82/366  soft-tissue]
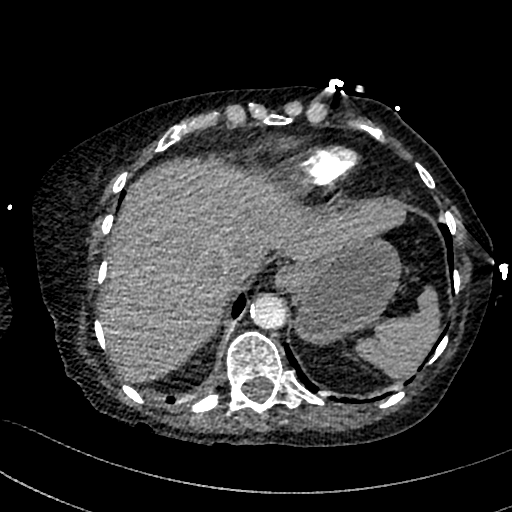
[im 102/366  lung]
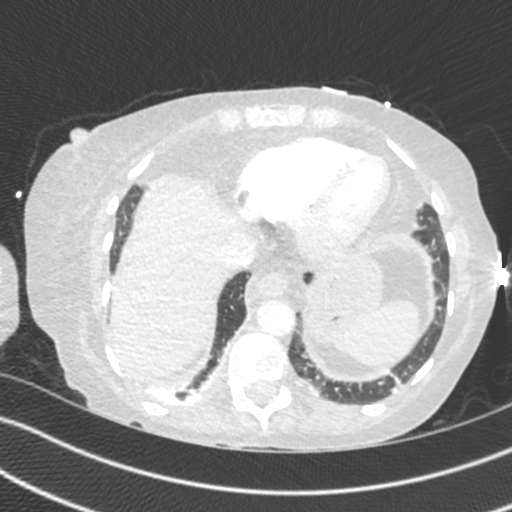
[im 122/366  soft-tissue]
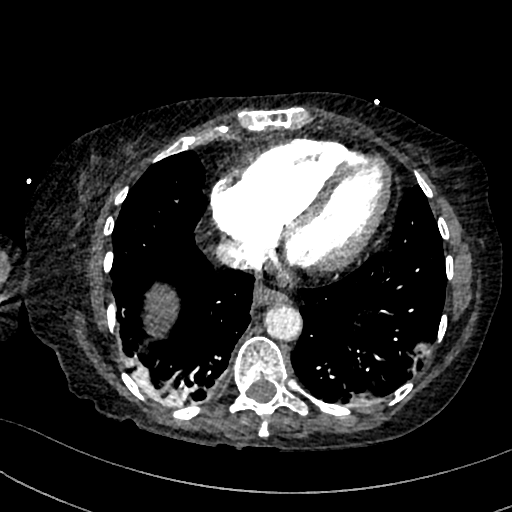
[im 142/366  lung]
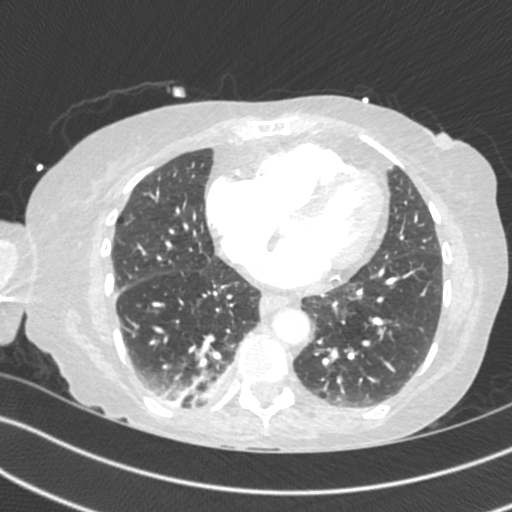
[im 163/366  soft-tissue]
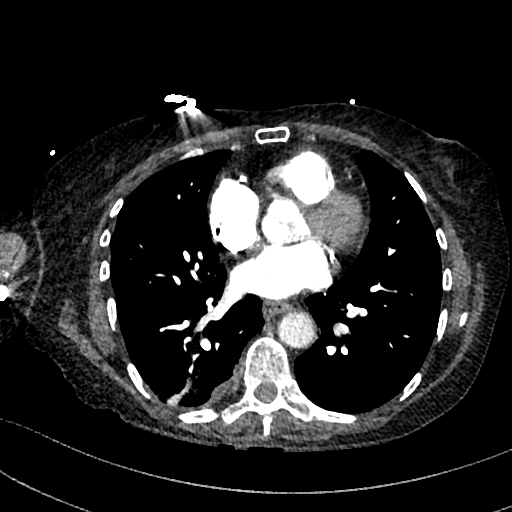
[im 203/366  lung]
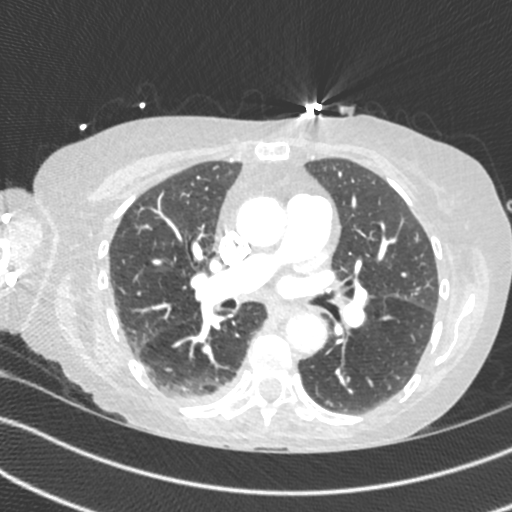
[im 224/366  soft-tissue]
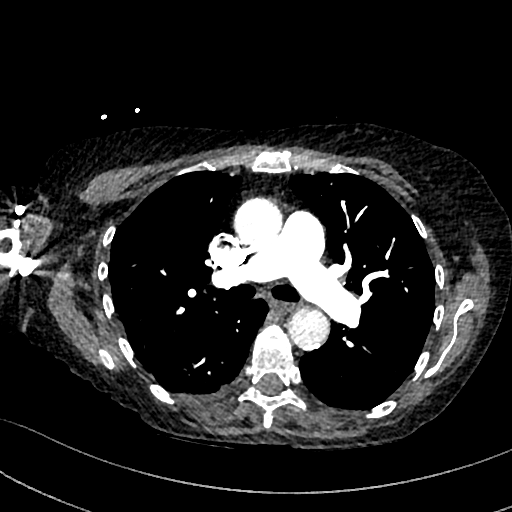
[im 244/366  lung]
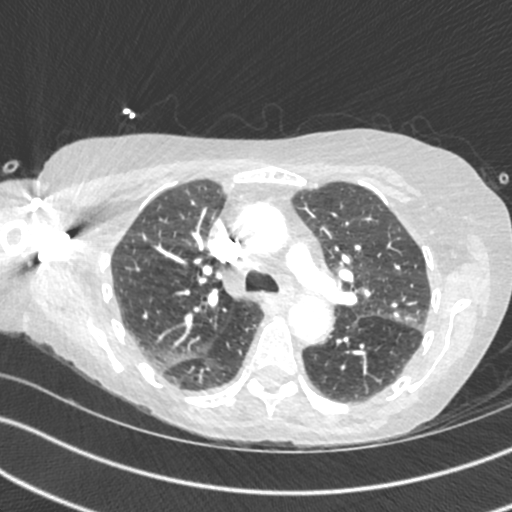
[im 264/366  soft-tissue]
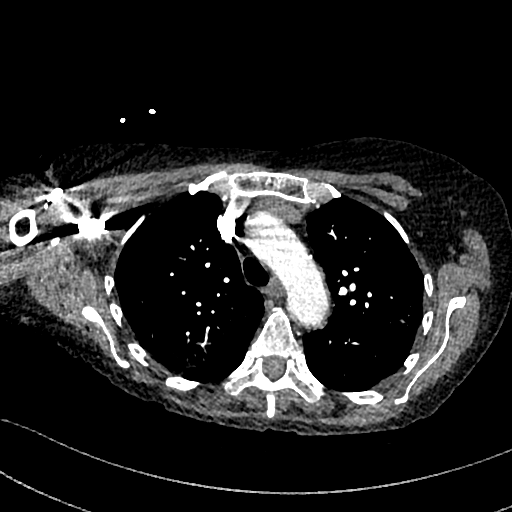
[im 284/366  lung]
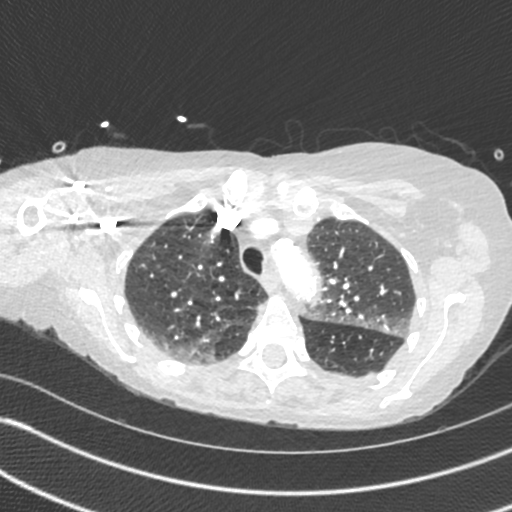
[im 305/366  soft-tissue]
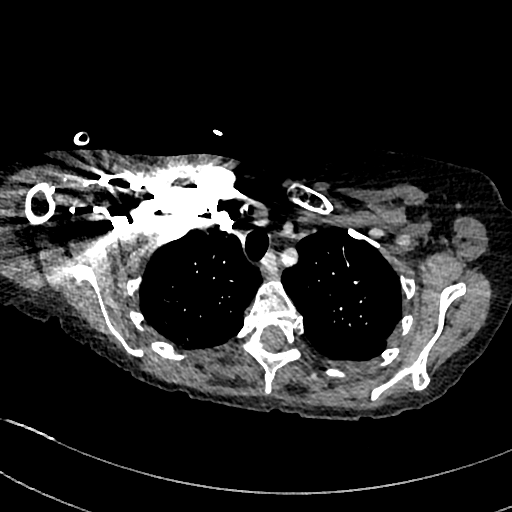
[im 325/366  lung]
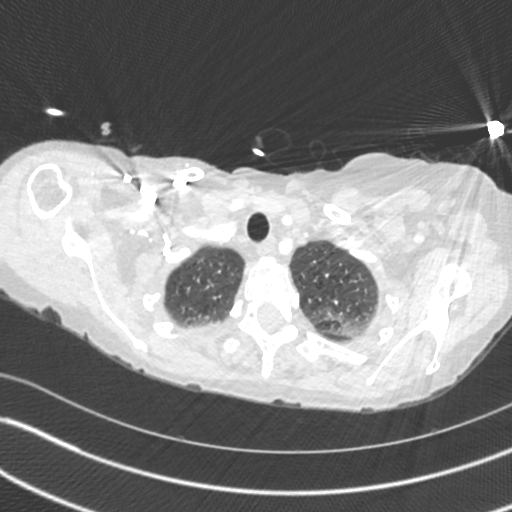
[im 345/366  soft-tissue]
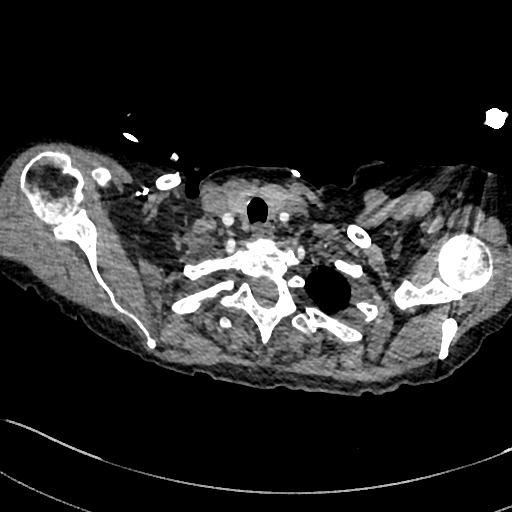

[Series 8: cor · coronal · 0.59mm/px · 3 of 149 slices shown]
[im 38/149  soft-tissue]
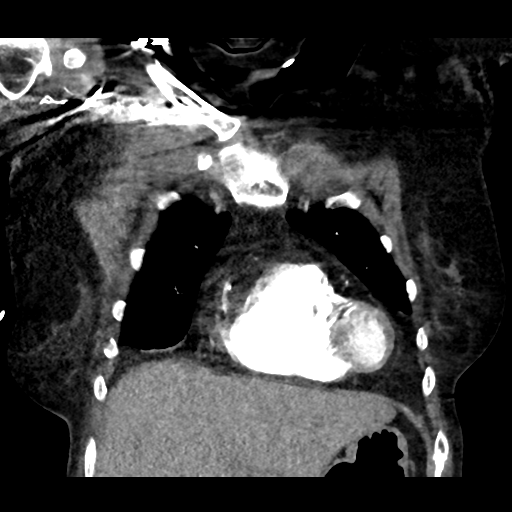
[im 75/149  soft-tissue]
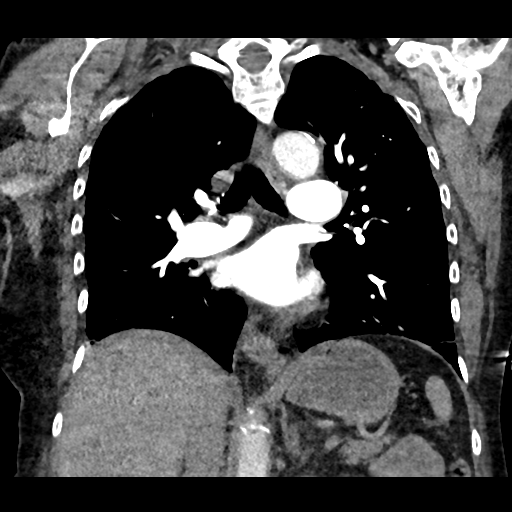
[im 112/149  soft-tissue]
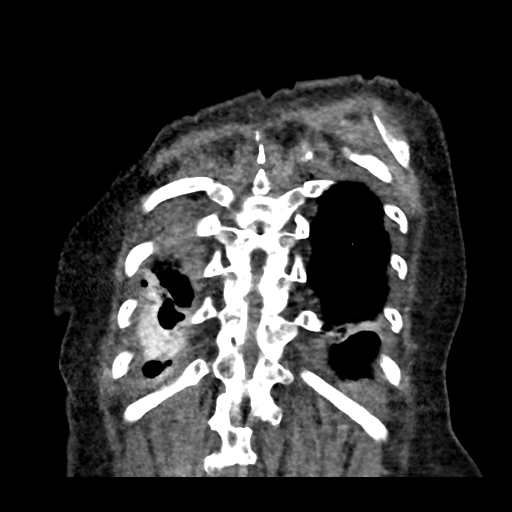

[19 of 46 positions shown; findings below may reference images not displayed]

FINDINGS: Cardiovascular: There are no filling defects within the pulmonary
arteries to suggest pulmonary embolus. Atherosclerosis of the
thoracic aorta without aneurysm or dissection. No evidence of acute
aortic syndrome. Coronary artery calcifications. Heart is normal in
size. No pericardial effusion.

Mediastinum/Nodes: Small mediastinal nodes are not enlarged by size
criteria. No hilar adenopathy. Improved esophageal wall thickening
from prior. No dominant thyroid nodule.

Lungs/Pleura: Dependent linear opacities may be combination of
atelectasis and scarring. Mild emphysema. Pulmonary edema or acute
airspace disease. No pleural fluid. Trachea and mainstem bronchi are
patent.

Upper Abdomen: No acute findings.

Musculoskeletal: Remote lower sternal fracture. There are no acute
or suspicious osseous abnormalities.

Review of the MIP images confirms the above findings.
IMPRESSION: 1. No pulmonary embolus. Bibasilar atelectasis or scarring. No acute
intrathoracic abnormality.
2. Coronary artery calcifications.

Aortic Atherosclerosis (SDAME-1VE.E).

## 2020-12-28 IMAGING — DX PORTABLE CHEST - 1 VIEW
1 series · 1 of 1 positions shown · non-contrast
Comparison: 04/07/2019

CLINICAL DATA: Short of breath

EXAM:
PORTABLE CHEST 1 VIEW

[chest ap]
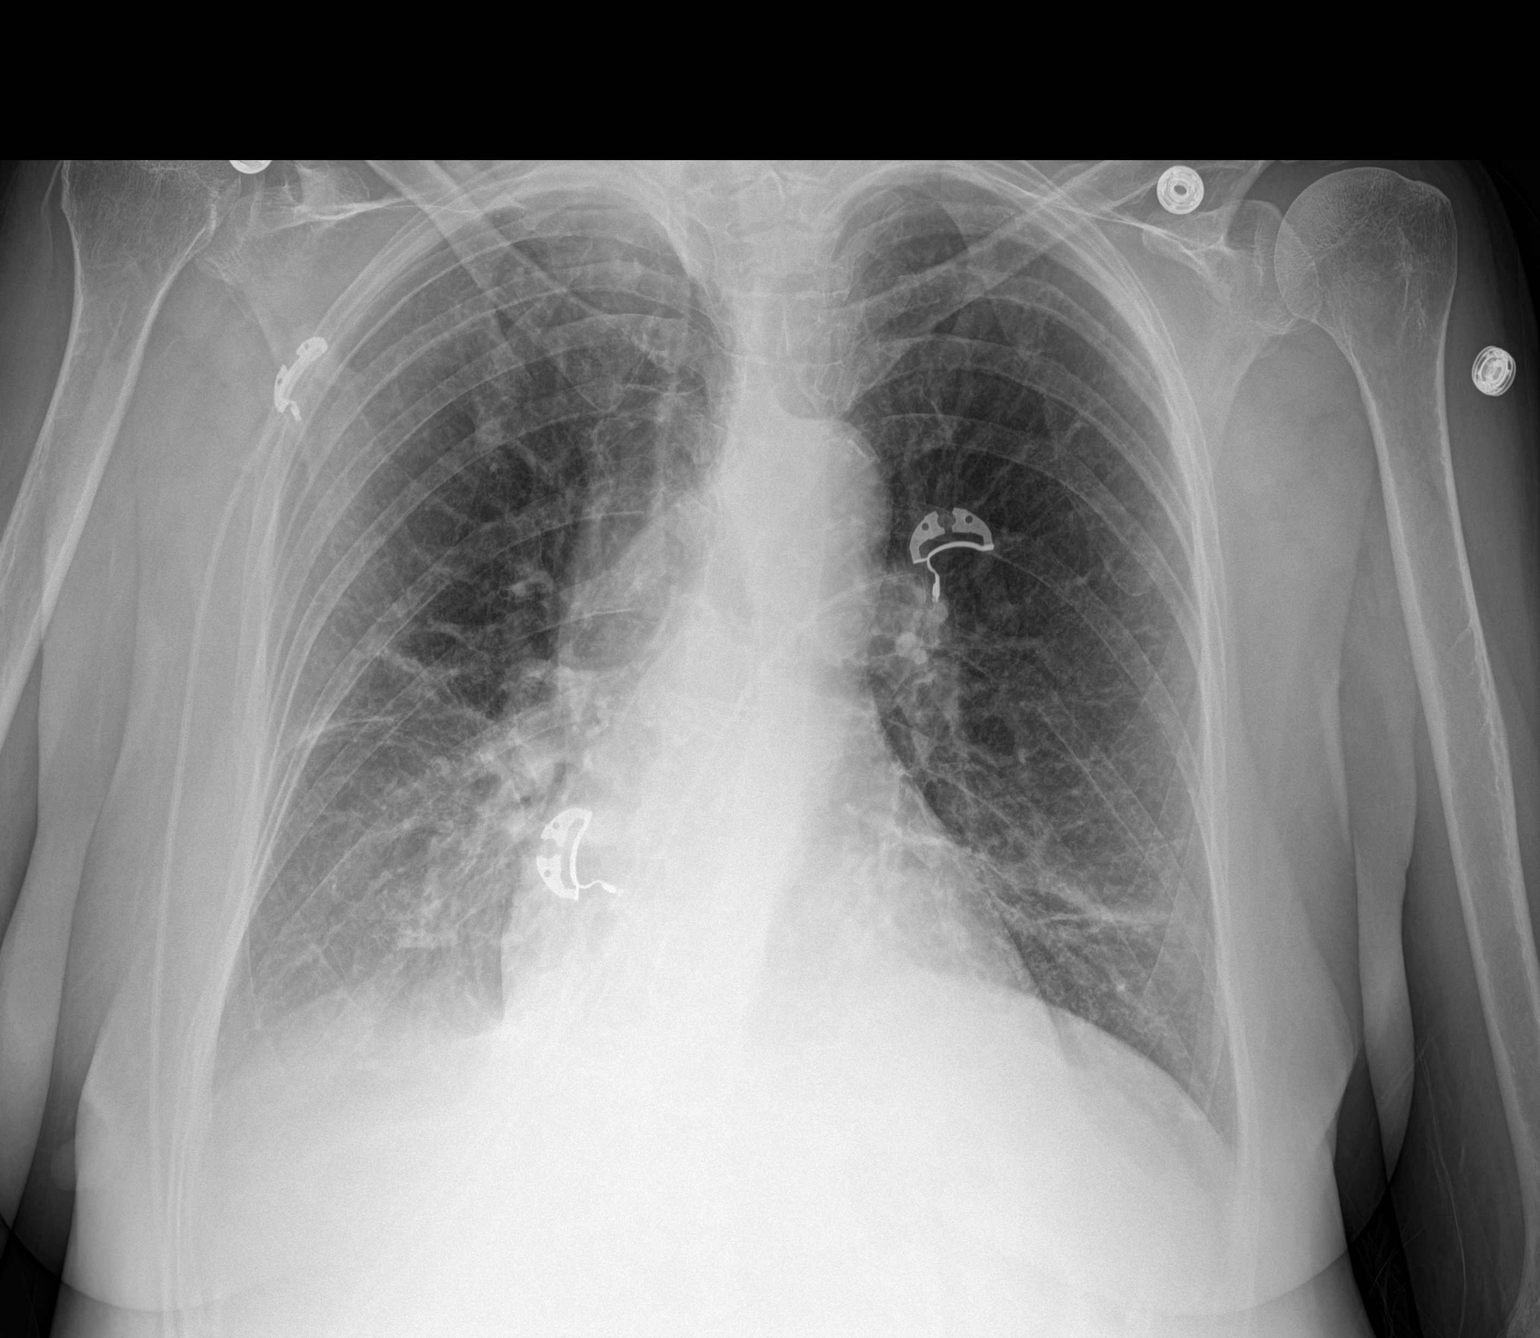

[1 of 1 positions shown; findings below may reference images not displayed]

FINDINGS: Progression of mild right lower lobe airspace disease. Left lower
lobe atelectasis slightly progressive. Small right effusion.

Negative for heart failure.
IMPRESSION: Mild progression of bibasilar airspace disease right greater than
left and small right effusion. Possible pneumonia versus
atelectasis.

## 2020-12-30 IMAGING — DX PORTABLE CHEST - 1 VIEW
1 series · 1 of 1 positions shown · non-contrast
Comparison: Chest x-ray dated April 09, 2019.

CLINICAL DATA: Shortness of breath.

EXAM:
PORTABLE CHEST 1 VIEW

[chest ap]
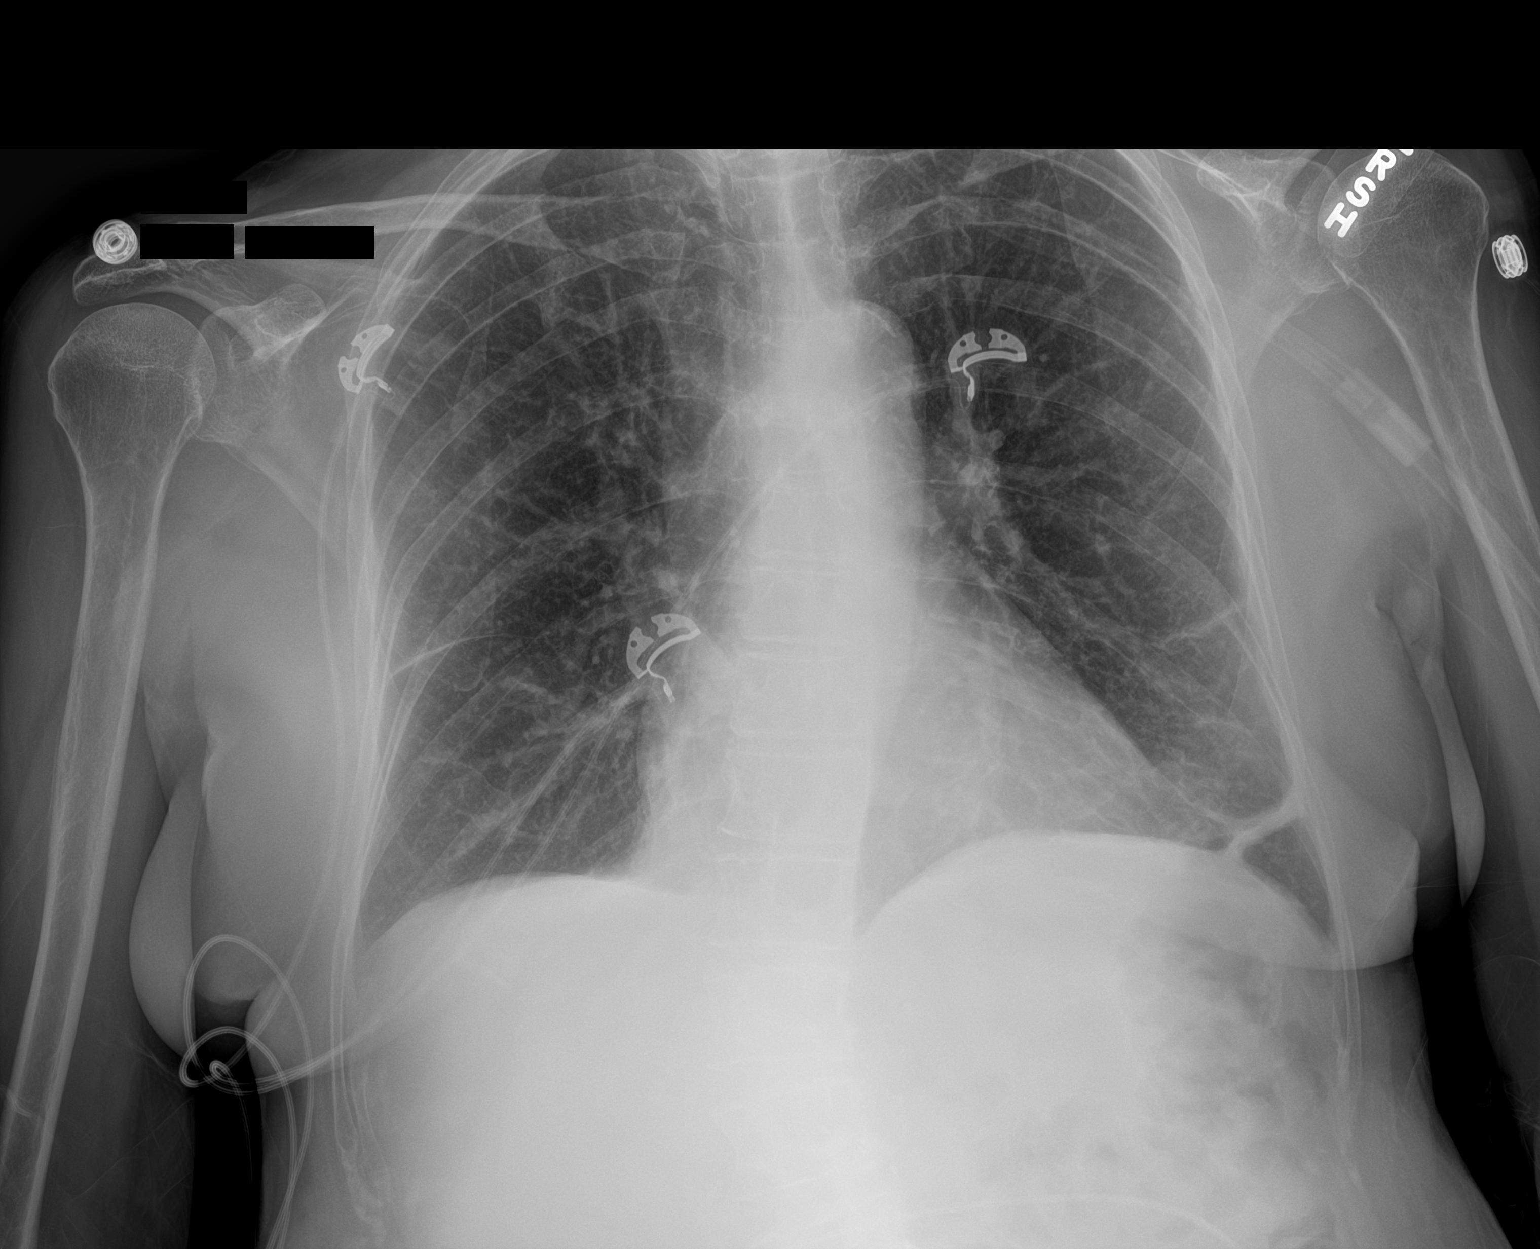

[1 of 1 positions shown; findings below may reference images not displayed]

FINDINGS: The heart size and mediastinal contours are within normal limits.
Normal pulmonary vascularity. Atherosclerotic calcification of the
aortic arch. Improved aeration of the right lower lobe with trace
residual pleural effusion. Bandlike atelectasis at the peripheral
left lung base. No consolidation or pneumothorax. No acute osseous
abnormality.
IMPRESSION: 1. Improved aeration of the right lower lobe with trace residual
pleural effusion.
2. Persistent left basilar subsegmental atelectasis.

## 2021-01-02 DIAGNOSIS — Z79891 Long term (current) use of opiate analgesic: Secondary | ICD-10-CM | POA: Diagnosis not present

## 2021-01-02 DIAGNOSIS — M25551 Pain in right hip: Secondary | ICD-10-CM | POA: Diagnosis not present

## 2021-01-02 DIAGNOSIS — R0789 Other chest pain: Secondary | ICD-10-CM | POA: Diagnosis not present

## 2021-01-02 DIAGNOSIS — G894 Chronic pain syndrome: Secondary | ICD-10-CM | POA: Diagnosis not present

## 2021-01-03 DIAGNOSIS — I509 Heart failure, unspecified: Secondary | ICD-10-CM | POA: Diagnosis not present

## 2021-01-09 DIAGNOSIS — I509 Heart failure, unspecified: Secondary | ICD-10-CM | POA: Diagnosis not present

## 2021-01-16 ENCOUNTER — Telehealth (INDEPENDENT_AMBULATORY_CARE_PROVIDER_SITE_OTHER): Payer: Medicare HMO | Admitting: Psychiatry

## 2021-01-16 ENCOUNTER — Other Ambulatory Visit: Payer: Self-pay

## 2021-01-16 DIAGNOSIS — F411 Generalized anxiety disorder: Secondary | ICD-10-CM

## 2021-01-16 DIAGNOSIS — R69 Illness, unspecified: Secondary | ICD-10-CM | POA: Diagnosis not present

## 2021-01-16 DIAGNOSIS — F341 Dysthymic disorder: Secondary | ICD-10-CM | POA: Diagnosis not present

## 2021-01-16 NOTE — Progress Notes (Signed)
BH MD/PA/NP OP Progress Note  01/16/2021 3:42 PM Denise King  MRN:  [id number] Interview was conducted by phone and I verified that I was speaking with the correct person using two identifiers. I discussed the limitations of evaluation and management by telemedicine and  the availability of in person appointments. Patient expressed understanding and agreed to proceed. Participants in the visit: patient (location - home); physician (location - home office).  Chief Complaint: More stress lately.  HPI: 70yo female with chronic anxiety/depression which worsened with increased physical problems/pain. She has been for a long time on Cymbalta - we have cross tapered it to sertraline which she toleratedwell and dose has been increased to 150 mg as she continued to reports feeling anxious and depressed.She was taken off clonazepam out of concern about combining benzodiazepines with narcotic analgesics and instead buspirone was added. She did not find it very effective.We have added a low dose of aripiprazole to augment sertraline and [name] reported that her mood started to improve.While in the hospital in December she was taken off hydroxyzine - she takes melatonin now and her sleep is adequate.She wasrestarted onalprazolam low dose as needed for anxietyin March (by GI)but her pain specialist has been concerned about likely interaction with opioid analgesics she is on.Brooksreports falling in early July, broke sternum (although chart indicated that it was a result of CPR) and several of her medications were stopped at that time (including alprazolam, buspirone, aripiprazole and bupropion). She remains on oxycodone and MS Contin for chronic pain. Her mood which had been fairly stable lately worsened again due to family circumstances: daughter with bipolar disorder and substance abuse issues in conflict with Brooks'es husband (yelling, arguments) and she is being in a position of  "peacemaker". She  feels at times like giving up and walking away from "all that".   Visit Diagnosis:    ICD-10-CM   1. Generalized anxiety disorder  F41.1   2. DYSTHYMIA  F34.1     Past Psychiatric History: Please see intake H&P.  Past Medical History:  Past Medical History:  Diagnosis Date  . Acute cystitis   . Acute respiratory failure ([place])   . Allergy    as a child grew out of them  . Anemia    pernicious anemia  . Anxiety   . Arthritis   . Back pain   . Blood transfusion   . CAP (community acquired pneumonia)   . CHF (congestive heart failure) ([place])   . Chronic pain syndrome   . Common bile duct stone   . Depression   . Diverticulosis of colon   . Duodenal ulcer hemorrhagic-after biliary sphincterotomy   . Dysthymia   . Esophagitis   . EtOH dependence ([place])   . Gastritis   . GERD (gastroesophageal reflux disease)   . H/O chest pain Dec. 2013   no work up done  . Hx of colonic polyps   . Hypertension   . Irritable bowel syndrome   . Lymphocytic colitis 02/16/2020  . Osteopenia   . Osteoporosis   . Pneumonia 2019  . Polypharmacy   . Reflux esophagitis   . Right sided sciatica 12/22/2017  . Tobacco use disorder   . Unspecified chronic bronchitis ([place])   . Vertigo   . Vitamin B12 deficiency   . Wears glasses     Past Surgical History:  Procedure Laterality Date  . APPENDECTOMY    . BACK SURGERY  10-09   Dr. Patrice Paradise  . BIOPSY  02/14/2020  Procedure: BIOPSY;  Surgeon: Gatha Mayer, MD;  Location: [facility];  Service: Endoscopy;;  . CARPAL TUNNEL RELEASE Right 08/14/2014   Procedure: RIGHT CARPAL TUNNEL RELEASE AND INJECT LEFT THUMB;  Surgeon: Daryll Brod, MD;  Location: [facility];  Service: Orthopedics;  Laterality: Right;  . CHOLECYSTECTOMY N/A 02/22/2020   Procedure: LAPAROSCOPIC CHOLECYSTECTOMY WITH INTRAOPERATIVE CHOLANGIOGRAM;  Surgeon: Kieth Brightly Arta Bruce, MD;  Location: [facility];  Service: General;  Laterality: N/A;  . COLONOSCOPY    .  COLONOSCOPY WITH PROPOFOL N/A 02/14/2020   Procedure: COLONOSCOPY WITH PROPOFOL;  Surgeon: Gatha Mayer, MD;  Location: [facility];  Service: Endoscopy;  Laterality: N/A;  . ENDOSCOPIC RETROGRADE CHOLANGIOPANCREATOGRAPHY (ERCP) WITH PROPOFOL N/A 12/25/2019   Procedure: ENDOSCOPIC RETROGRADE CHOLANGIOPANCREATOGRAPHY (ERCP) WITH PROPOFOL;  Surgeon: Gatha Mayer, MD;  Location: [place];  Service: Gastroenterology;  Laterality: N/A;  . ESOPHAGOGASTRODUODENOSCOPY (EGD) WITH PROPOFOL N/A 01/06/2020   Procedure: ESOPHAGOGASTRODUODENOSCOPY (EGD) WITH PROPOFOL;  Surgeon: Irene Shipper, MD;  Location: [facility] END[facility]COPY;  Service: Endoscopy;  Laterality: N/A;  . HOT HEMOSTASIS N/A 01/06/2020   Procedure: HOT HEMOSTASIS (ARGON PLASMA COAGULATION/BICAP);  Surgeon: Irene Shipper, MD;  Location: [facility] END[facility]COPY;  Service: Endoscopy;  Laterality: N/A;  . INTRAMEDULLARY (IM) NAIL INTERTROCHANTERIC Right 10/01/2018   Procedure: INTRAMEDULLARY (IM) NAIL INTERTROCHANTRIC;  Surgeon: Thornton Park, MD;  Location: [facility];  Service: Orthopedics;  Laterality: Right;  . LAPAROSCOPY N/A 02/21/2013   Procedure: LAPAROSCOPY OPERATIVE;  Surgeon: Margarette Asal, MD;  Location: [facility];  Service: Gynecology;  Laterality: N/A;  REQUESTING 5MM SCOPE WITH CAMERA  . LEFT HEART CATH AND CORONARY ANGIOGRAPHY N/A 05/11/2019   Procedure: LEFT HEART CATH AND CORONARY ANGIOGRAPHY;  Surgeon: Sherren Mocha, MD;  Location: [place] [facility];  Service: Cardiovascular;  Laterality: N/A;  . REMOVAL OF STONES  12/25/2019   Procedure: Karlyn Agee;  Surgeon: Gatha Mayer, MD;  Location: [facility] ENDOSCOPY;  Service: Gastroenterology;;  . Clide Deutscher  01/06/2020   Procedure: Clide Deutscher;  Surgeon: Irene Shipper, MD;  Location: [facility];  Service: Endoscopy;;  . Joan Mayans  12/25/2019   Procedure: SPHINCTEROTOMY;  Surgeon: Gatha Mayer, MD;  Location: [facility];  Service: Gastroenterology;;  . TONSILLECTOMY    . TUBAL  LIGATION    . UPPER GASTROINTESTINAL ENDOSCOPY      Family Psychiatric History: Reviewed.  Family History:  Family History  Problem Relation Age of Onset  . Colon cancer Father   . Prostate cancer Father   . Heart disease Mother        prev MVR, also has DJD  . Colon cancer Brother   . Skin cancer Sister   . Alcohol abuse Daughter   . Bipolar disorder Daughter   . Drug abuse Daughter   . Esophageal cancer Neg Hx   . Rectal cancer Neg Hx   . Stomach cancer Neg Hx     Social History:  Social History   Socioeconomic History  . Marital status: Married    Spouse name: Not on file  . Number of children: 4  . Years of education: 46  . Highest education level: Not on file  Occupational History  . Occupation: retired    Comment: disablility  Tobacco Use  . Smoking status: Current Every Day Smoker    Packs/day: 1.00    Years: 30.00    Pack years: 30.00    Types: Cigarettes  . Smokeless tobacco: Never Used  Vaping Use  . Vaping Use: Never used  Substance and Sexual Activity  . Alcohol use: Yes    Alcohol/week: 3.0 standard drinks    Types: 3 Standard drinks or equivalent per week    Comment: occasional  . Drug use: No  . Sexual activity: Yes  Other Topics Concern  . Not on file  Social History Narrative   Married x 38 yrs. Has BS degree in Horticulture from [employer]. Currently resides in a house with her husband. 3 cats.  Fun: used to like to do a lot of things.    Denies religious beliefs that would effect health care.    lives with husband [name]; quit smoking- October 6th, 2019. ocasional alcohol. [employer] [2009] on disability sec to back issues.    Social Determinants of Health   Financial Resource Strain: Not on file  Food Insecurity: Not on file  Transportation Needs: No Transportation Needs  . Lack of Transportation (Medical): No  . Lack of Transportation (Non-Medical): No  Physical Activity: Not on file  Stress: Not on file  Social  Connections: Moderately Isolated  . Frequency of Communication with Friends and Family: More than three times a week  . Frequency of Social Gatherings with Friends and Family: Once a week  . Attends Religious Services: Never  . Active Member of Clubs or Organizations: No  . Attends Archivist Meetings: Never  . Marital Status: Married    Allergies:  Allergies  Allergen Reactions  . Atorvastatin Nausea And Vomiting  . Levofloxacin Other (See Comments)    Reaction: achilles tendon pain  . Omnicef [Cefdinir] Diarrhea    Uncontrollable diarrhea  . Trazodone And Nefazodone Other (See Comments)    "Felt like I was going to faint"  . Sulfa Antibiotics Rash  . Sulfonamide Derivatives Rash    Metabolic Disorder Labs: Lab Results  Component Value Date   HGBA1C 5.2 10/01/2018   MPG 103 10/01/2018   No results found for: PROLACTIN Lab Results  Component Value Date   CHOL 160 05/16/2020   TRIG 153 (H) 05/16/2020   HDL 61 05/16/2020   CHOLHDL 2.6 05/16/2020   VLDL 10 04/09/2019   LDLCALC 73 05/16/2020   LDLCALC 47 12/04/2019   Lab Results  Component Value Date   TSH 0.499 04/08/2019   TSH 1.108 10/01/2018    Therapeutic Level Labs: No results found for: LITHIUM No results found for: VALPROATE No components found for:  CBMZ  Current Medications: Current Outpatient Medications  Medication Sig Dispense Refill  . aspirin EC 81 MG tablet Take 1 tablet (81 mg total) by mouth daily.    . budesonide (ENTOCORT EC) 3 MG 24 hr capsule TAKE 3 CAPSULES ONCE DAILY. (Patient taking differently: Take 9 mg by mouth daily. ) 90 capsule 1  . buPROPion (WELLBUTRIN SR) 150 MG 12 hr tablet Take 1 tablet (150 mg total) by mouth daily. 90 tablet 1  . Cholecalciferol (VITAMIN D) 50 MCG (2000 UT) tablet Take 2,000 Units by mouth daily after breakfast.     . denosumab (PROLIA) 60 MG/ML SOSY injection Inject 60 mg into the skin every 6 (six) months.    . Diphenoxylate-Atropine (LOMOTIL PO)  Lomotil  1 po qid prn    . folic acid (FOLVITE) 1 MG tablet Take 1 tablet (1 mg total) by mouth daily. (Patient taking differently: Take 1 mg by mouth daily after breakfast. ) 90 tablet 3  . gabapentin (NEURONTIN) 300 MG capsule Take 300 mg by mouth 3 (three) times daily.    Marland Kitchen  hyoscyamine (ANASPAZ) 0.125 MG TBDP disintergrating tablet Place 0.125 mg under the tongue every 6 (six) hours as needed ([name] cramps).     . morphine (MS CONTIN) 15 MG 12 hr tablet Take 15 mg by mouth 3 (three) times daily.    . Multiple Vitamin (MULTIVITAMIN WITH MINERALS) TABS tablet Take 1 tablet by mouth daily. 30 tablet 0  . nicotine (NICODERM CQ - DOSED IN MG/24 HOURS) 21 mg/24hr patch Place 1 patch (21 mg total) onto the skin daily. 28 patch 0  . nitroGLYCERIN (NITROSTAT) 0.4 MG SL tablet Place 1 tablet (0.4 mg total) under the tongue every 5 (five) minutes as needed for chest pain. 25 tablet 3  . ondansetron (ZOFRAN-ODT) 4 MG disintegrating tablet DISSOLVE 1 TABLET ON TONGUE EVERY 8 HOURS AS NEEDED FOR NAUSEA/VOMITING. 15 tablet 0  . Oxycodone HCl 10 MG TABS Take 10 mg by mouth 4 (four) times daily.     . pantoprazole (PROTONIX) 40 MG tablet TAKE (1) TABLET TWICE A DAY BEFORE MEALS. (Patient taking differently: Take 40 mg by mouth 2 (two) times daily with a meal. ) 60 tablet 4  . potassium chloride (MICRO-K) 10 MEQ CR capsule Take 1 capsule (10 mEq total) by mouth 2 (two) times daily. 90 capsule 3  . rosuvastatin (CRESTOR) 5 MG tablet Take 1 tablet (5 mg total) by mouth 3 (three) times a week. 39 tablet 3  . sertraline (ZOLOFT) 100 MG tablet Take 2 tablets (200 mg total) by mouth at bedtime. 180 tablet 1  . spironolactone (ALDACTONE) 25 MG tablet Take 0.5 tablets (12.5 mg total) by mouth daily. 45 tablet 3  . valACYclovir (VALTREX) 500 MG tablet Take 500 mg by mouth 2 (two) times daily as needed (flare ups).     . vitamin B-12 (CYANOCOBALAMIN) 100 MCG tablet Take 1 tablet (100 mcg total) by mouth daily. (Patient taking  differently: Take 100 mcg by mouth daily after breakfast. ) 90 tablet 3   No current facility-administered medications for this visit.     Psychiatric Specialty Exam: Review of Systems  Constitutional: Positive for fatigue.  Musculoskeletal: Positive for back pain.  Psychiatric/Behavioral: The patient is nervous/anxious.   All other systems reviewed and are negative.   There were no vitals taken for this visit.There is no height or weight on file to calculate BMI.  General Appearance: NA  Eye Contact:  NA  Speech:  Clear and Coherent and Normal Rate  Volume:  Normal  Mood:  Anxious and Depressed  Affect:  NA  Thought Process:  Goal Directed  Orientation:  Full (Time, Place, and Person)  Thought Content: Logical   Suicidal Thoughts:  No  Homicidal Thoughts:  No  Memory:  Immediate;   Good Recent;   Good Remote;   Good  Judgement:  Good  Insight:  Good  Psychomotor Activity:  NA  Concentration:  Concentration: Good  Recall:  Good  Fund of Knowledge: Good  Language: Good  Akathisia:  Negative  Handed:  Right  AIMS (if indicated): not done  Assets:  Communication Skills Desire for Improvement Financial Resources/Insurance Housing Resilience  ADL's:  Intact  Cognition: WNL  Sleep:  Fair   Screenings: PHQ2-9   [place] Office Visit from 06/17/2020 in [place] at [facility] Visit from 03/14/2020 in [place] at [facility] Visit from 09/11/2019 in [place] Visit from 10/25/2015 in [facility] at [facility] Total Score 1 1 1  3  PHQ-9 Total Score 11 - - 18       Assessment and Plan: 70yo female with chronic anxiety/depression which worsened with increased physical problems/pain. She has been for a long time on Cymbalta - we have cross tapered it to sertraline which she toleratedwell and dose has been increased to 150 mg as she continued to reports feeling anxious and depressed.She was  taken off clonazepam out of concern about combining benzodiazepines with narcotic analgesics and instead buspirone was added. She did not find it very effective.We have added a low dose of aripiprazole to augment sertraline and [name] reported that her mood started to improve.While in the hospital in December she was taken off hydroxyzine - she takes melatonin now and her sleep is adequate.She wasrestarted onalprazolam low dose as needed for anxietyin March (by GI)but her pain specialist has been concerned about likely interaction with opioid analgesics she is on.Brooksreports falling in early July, broke sternum (although chart indicated that it was a result of CPR) and several of her medications were stopped at that time (including alprazolam, buspirone, aripiprazole and bupropion). She remains on oxycodone and MS Contin for chronic pain. Her mood which had been fairly stable lately worsened again due to family circumstances: daughter with bipolar disorder and substance abuse issues in conflict with Brooks'es husband (yelling, arguments) and she is being in a position of  "peacemaker". She feels at times like giving up and walking away from "all that".  [id number] disorder;GAD;Chronic pain disorder  Plan:ContinueZoloft200 mg at [facility] Wellbutrin SR 150 mg in am. Next appointment in3 months with a new provider. The plan was discussed with patient who had an opportunity to ask questions and these were all answered. I spend109minutes inphone consultation with the patient.   Stephanie Acre, MD 01/16/2021, 3:42 PM

## 2021-01-22 DIAGNOSIS — E559 Vitamin D deficiency, unspecified: Secondary | ICD-10-CM | POA: Diagnosis not present

## 2021-01-22 DIAGNOSIS — M545 Low back pain, unspecified: Secondary | ICD-10-CM | POA: Diagnosis not present

## 2021-01-22 DIAGNOSIS — Z20822 Contact with and (suspected) exposure to covid-19: Secondary | ICD-10-CM | POA: Diagnosis not present

## 2021-01-22 DIAGNOSIS — M129 Arthropathy, unspecified: Secondary | ICD-10-CM | POA: Diagnosis not present

## 2021-01-22 DIAGNOSIS — M25512 Pain in left shoulder: Secondary | ICD-10-CM | POA: Diagnosis not present

## 2021-01-22 DIAGNOSIS — Z79899 Other long term (current) drug therapy: Secondary | ICD-10-CM | POA: Diagnosis not present

## 2021-01-22 DIAGNOSIS — R5383 Other fatigue: Secondary | ICD-10-CM | POA: Diagnosis not present

## 2021-01-22 DIAGNOSIS — M546 Pain in thoracic spine: Secondary | ICD-10-CM | POA: Diagnosis not present

## 2021-01-22 DIAGNOSIS — M25551 Pain in right hip: Secondary | ICD-10-CM | POA: Diagnosis not present

## 2021-01-22 DIAGNOSIS — M542 Cervicalgia: Secondary | ICD-10-CM | POA: Diagnosis not present

## 2021-01-22 DIAGNOSIS — M25561 Pain in right knee: Secondary | ICD-10-CM | POA: Diagnosis not present

## 2021-01-22 DIAGNOSIS — M25511 Pain in right shoulder: Secondary | ICD-10-CM | POA: Diagnosis not present

## 2021-01-22 IMAGING — DX CHEST - 2 VIEW
2 series · 2 of 2 positions shown · non-contrast
Comparison: Chest x-ray dated 04/11/2019

CLINICAL DATA: Viral illness.  Possible [id number].

EXAM:
CHEST - 2 VIEW

[chest pa]
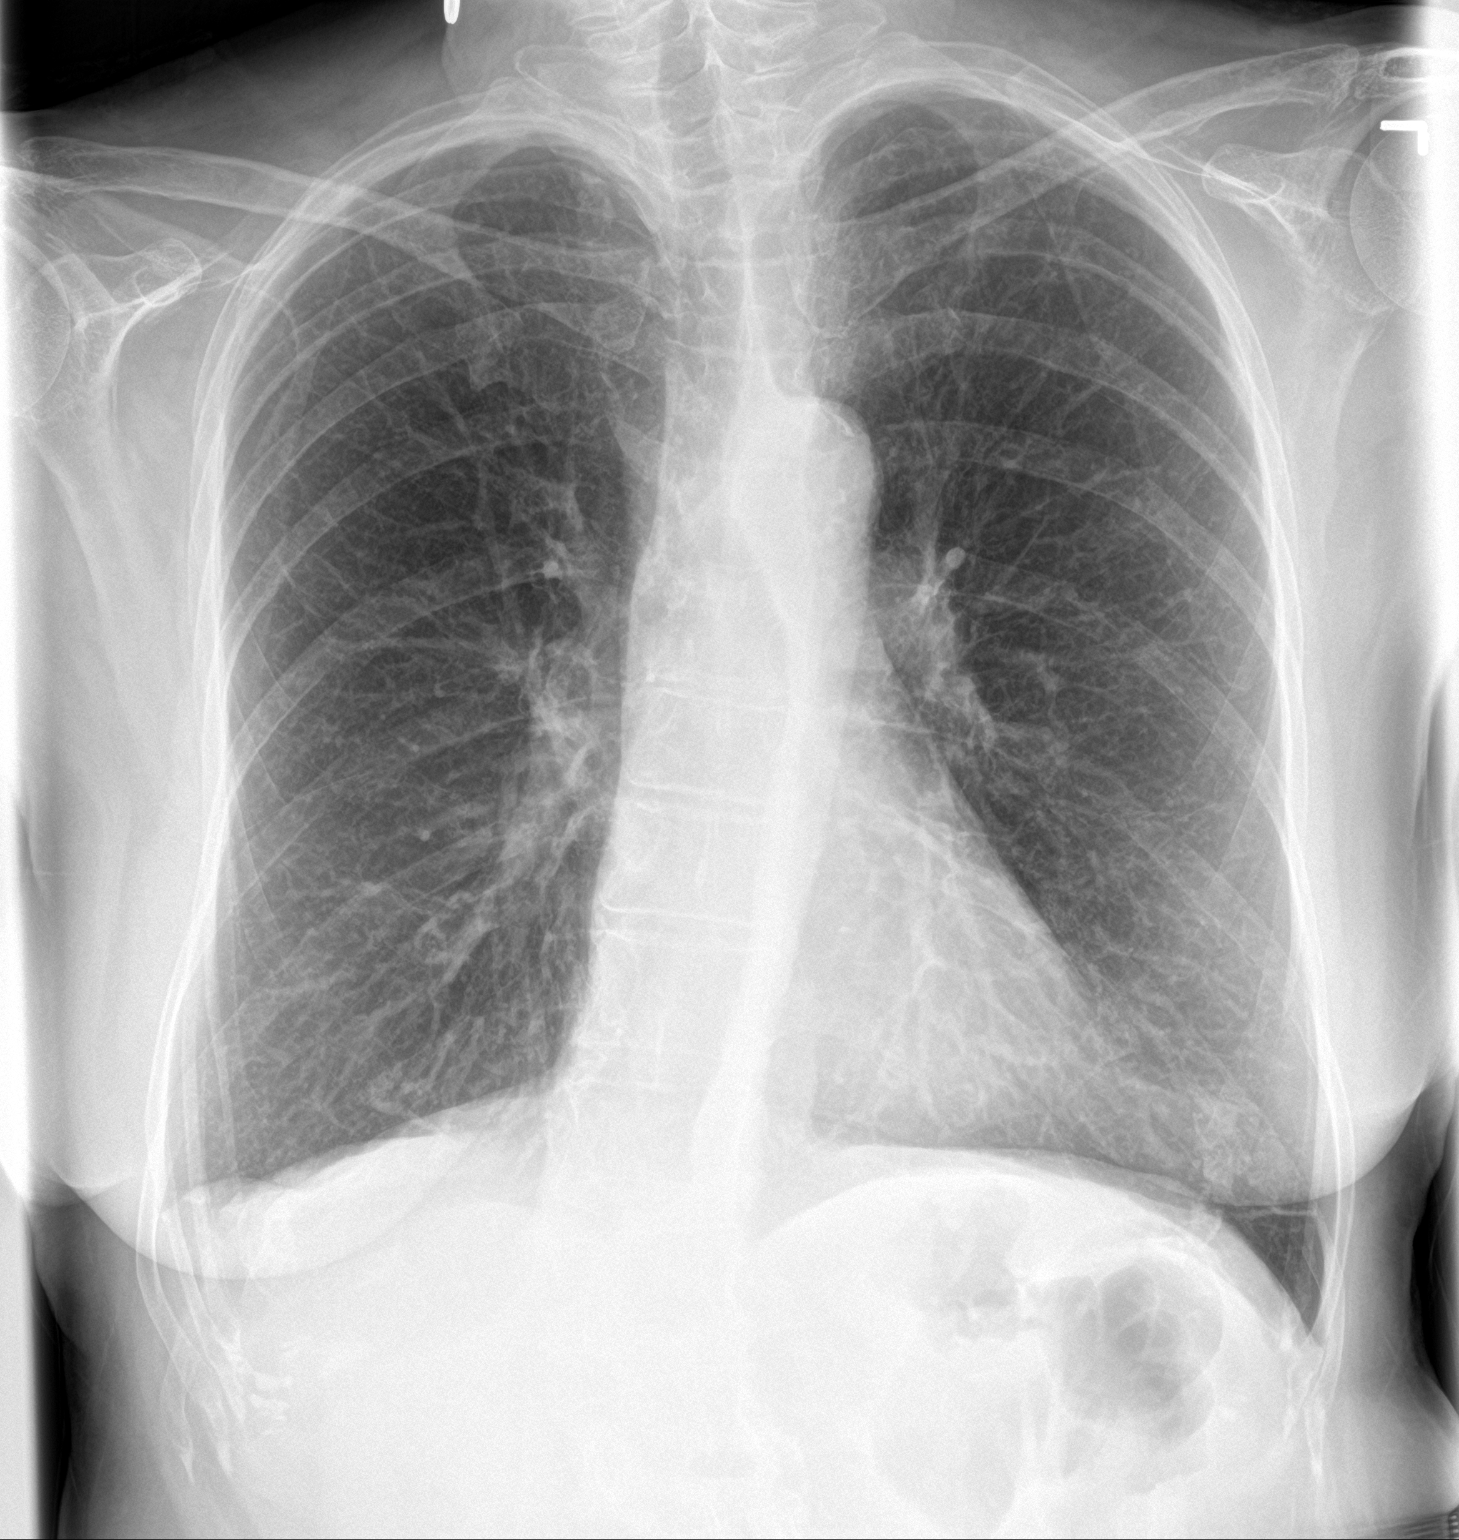

[chest lat]
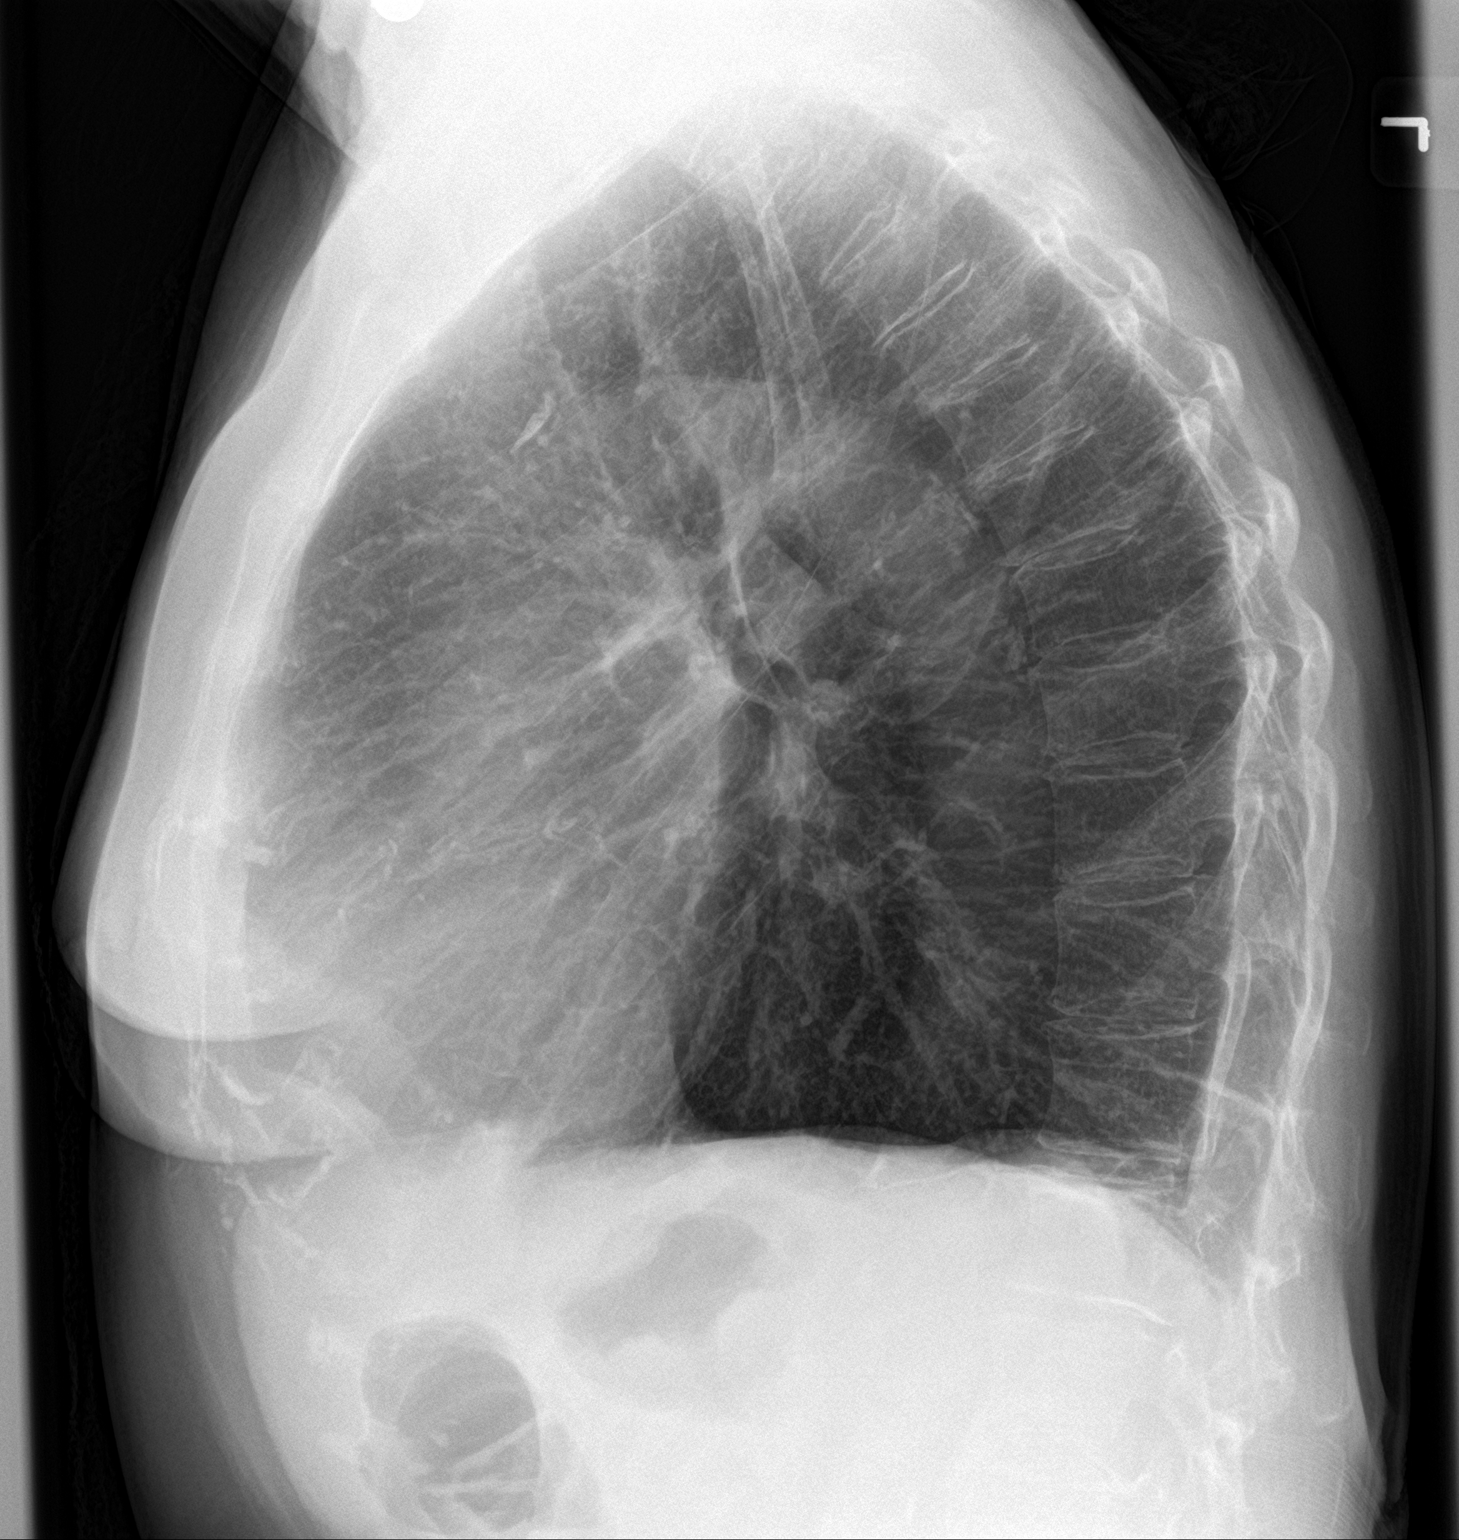

[2 of 2 positions shown; findings below may reference images not displayed]

FINDINGS: The previously noted scattered linear airspace opacities have
essentially resolved. The lungs are somewhat hyperexpanded which can
be seen in patients with COPD. The cardiac silhouette is not
significantly enlarged. No acute osseous abnormality detected. No
pneumothorax.
IMPRESSION: 1. Essentially complete resolution of the previously demonstrated
airspace opacities. No new area of consolidation.
2. Hyperexpanded lungs which can be seen in patients with COPD.

## 2021-01-27 IMAGING — CT CT HEAR MORPH WITH CTA COR WITH SCORE WITH CA WITH CONTRAST AND
1 of 8 series · 9 of 20 positions shown, 12 images · IV contrast (APPLIED)
Comparison: Chest CT 04/07/2019

Addendum:
CLINICAL DATA: Chest pain

EXAM:
Cardiac CTA
MEDICATIONS:
Sub lingual nitro. 4mg and lopressor 50mg PO
TECHNIQUE: The patient was scanned on a Siemens Force [REDACTED]ice scanner. Gantry
rotation speed was 250 msecs. Collimation was .6 mm. A 100 kV
prospective scan was triggered in the ascending thoracic aorta at
140 HU's Full mA was used between 35% and 75% of the R-R interval.
Average HR during the scan was 61 bpm. The 3D data set was
interpreted on a dedicated work station using MPR, MIP and VRT
modes. A total of 80 cc of contrast was used.

[Series 11: multiphase · axial · 0.31mm/px · z∈[+957,+1052]mm · 9 of 1773 slices shown, 12 images]
[im 178/1773  vessel]
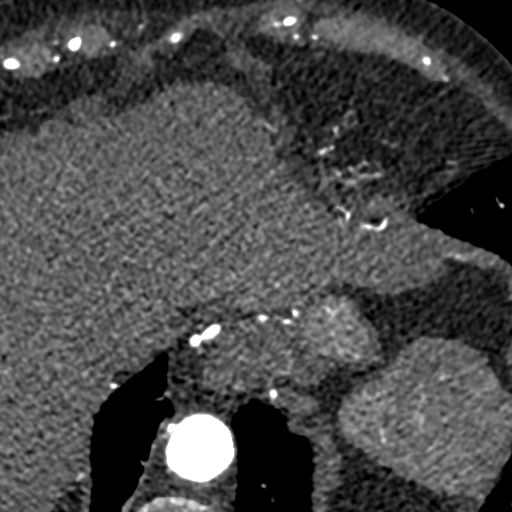
[im 178/1773  lung]
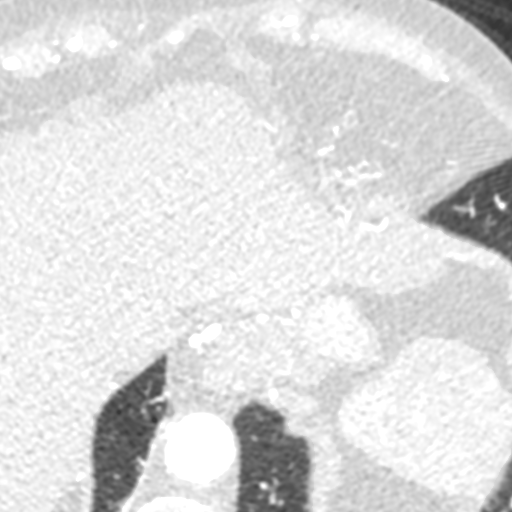
[im 355/1773  vessel]
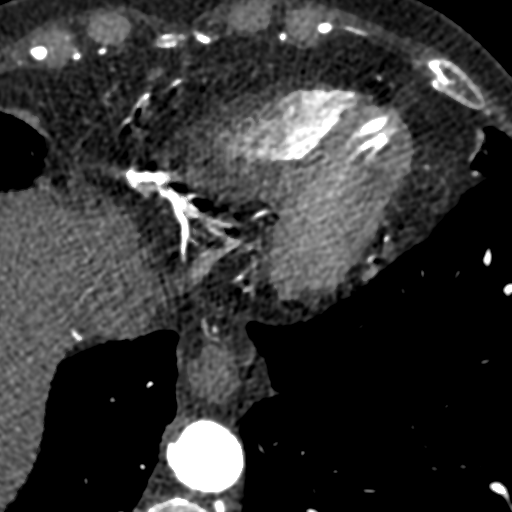
[im 532/1773  vessel]
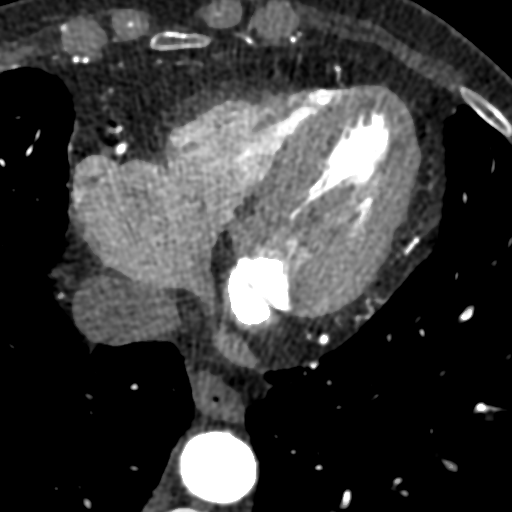
[im 709/1773  vessel]
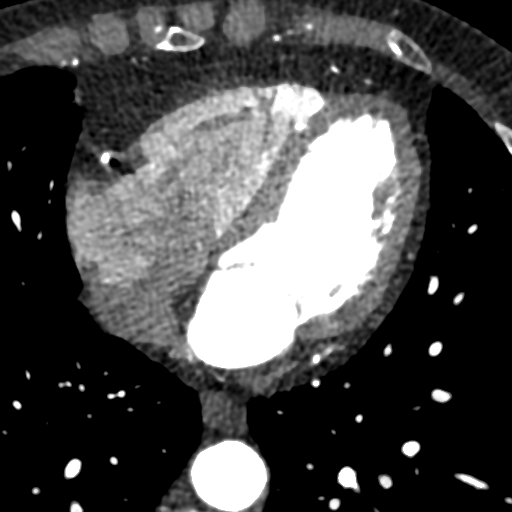
[im 887/1773  vessel]
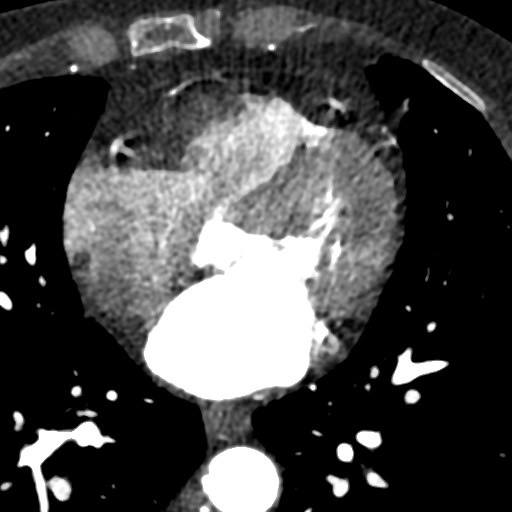
[im 887/1773  lung]
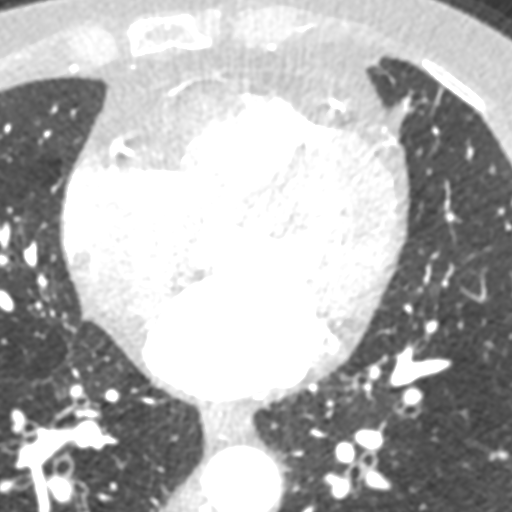
[im 1064/1773  vessel]
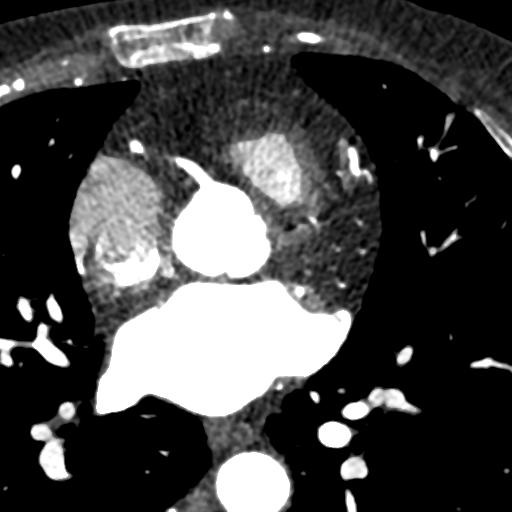
[im 1241/1773  vessel]
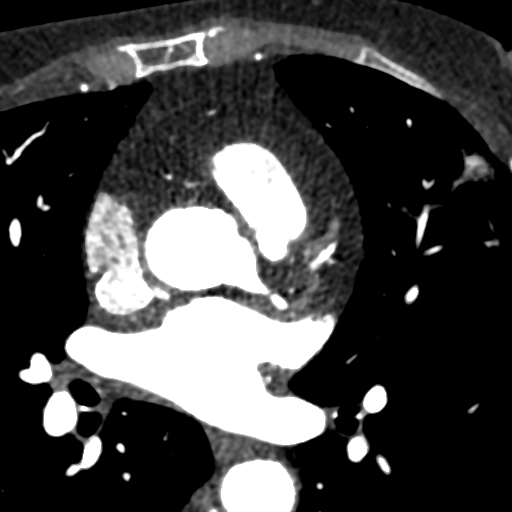
[im 1418/1773  vessel]
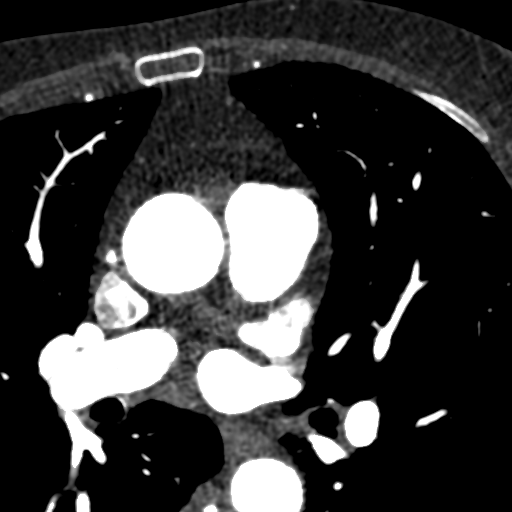
[im 1595/1773  vessel]
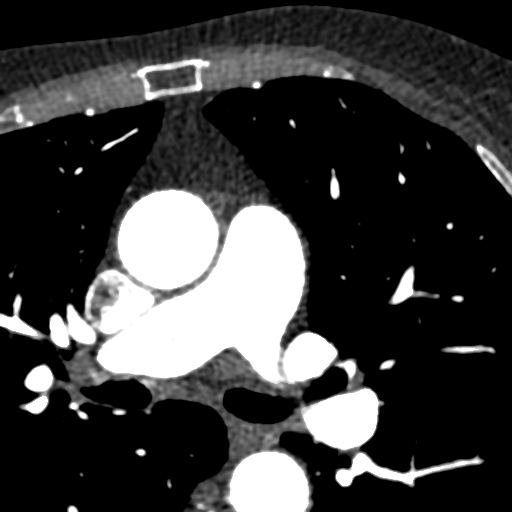
[im 1595/1773  lung]
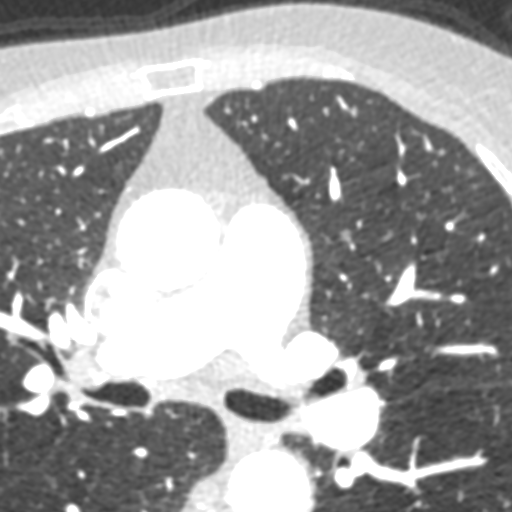

[9 of 20 positions shown; findings below may reference images not displayed]

FINDINGS: Non-cardiac: See separate report from [REDACTED]. No
significant findings on limited lung and soft tissue windows.

Calcium Score: Severe diffuse 3 vessel coronary calcium

Coronary Arteries: Right dominant with no anomalies

LM: Normal

LAD: 50-75% diffuse calcific disease likely obstructive in proximal
and mid LAD

D1: Small less than 30% mixed plaque

D2: Small less than 30% mixed plaque

Circumflex: 50% or less calcific plaque proximally and mid vessel

OM1: Less than 50% mixed plaque

OM2: Not well seen

RCA: 50-75% mixed plaque in proximal to mid vessel 50% calcific
plaque distally

PDA: 50-75% mixed plaque

PLA: Poorly seen
IMPRESSION: 1. Calcium score extremely high involving all 3 major epicardial
vessels 9669 which is 99 th percentile for age and sex

2. Likely obstructive CAD in proximal/mid RCA and LAD study will be
sent for FFR CT but patient referred for cath

3.  Normal aortic root diameter 3.2 cm with calcific atherosclerosis

Matusala Flohil

EXAM:
OVER-READ INTERPRETATION  CT CHEST

The following report is an over-read performed by radiologist Dr.
does not include interpretation of cardiac or coronary anatomy or
pathology. The coronary CTA interpretation by the cardiologist is
attached.
FINDINGS: Diffuse coronary artery calcifications. Normal caliber of the
visualized thoracic aorta. Atherosclerotic calcifications in the
descending thoracic aorta. No significant pericardial fluid. Main
pulmonary arteries are patent.

Visualized mediastinal and hilar structures are unremarkable. Images
of the upper abdomen are unremarkable.

Small amount of atelectasis or scarring in the lingula and right
lung base. No significant airspace disease or lung consolidation. No
large pleural effusions.

Visualized bone structures are unremarkable.
IMPRESSION: 1. Negative over-read examination.  No acute findings.
2.  Aortic Atherosclerosis ([id number]).

*** End of Addendum ***
FINDINGS: Non-cardiac: See separate report from [REDACTED]. No
significant findings on limited lung and soft tissue windows.

Calcium Score: Severe diffuse 3 vessel coronary calcium

Coronary Arteries: Right dominant with no anomalies

LM: Normal

LAD: 50-75% diffuse calcific disease likely obstructive in proximal
and mid LAD

D1: Small less than 30% mixed plaque

D2: Small less than 30% mixed plaque

Circumflex: 50% or less calcific plaque proximally and mid vessel

OM1: Less than 50% mixed plaque

OM2: Not well seen

RCA: 50-75% mixed plaque in proximal to mid vessel 50% calcific
plaque distally

PDA: 50-75% mixed plaque

PLA: Poorly seen
IMPRESSION: 1. Calcium score extremely high involving all 3 major epicardial
vessels 9669 which is 99 th percentile for age and sex

2. Likely obstructive CAD in proximal/mid RCA and LAD study will be
sent for FFR CT but patient referred for cath

3.  Normal aortic root diameter 3.2 cm with calcific atherosclerosis

Matusala Flohil

## 2021-01-28 ENCOUNTER — Emergency Department (HOSPITAL_COMMUNITY): Payer: Medicare HMO

## 2021-01-28 ENCOUNTER — Emergency Department (HOSPITAL_COMMUNITY)
Admission: EM | Admit: 2021-01-28 | Discharge: 2021-01-28 | Disposition: A | Discharge status: Home / Self Care | Payer: Medicare HMO | Attending: Emergency Medicine | Admitting: Emergency Medicine

## 2021-01-28 ENCOUNTER — Encounter (HOSPITAL_COMMUNITY): Payer: Self-pay | Admitting: Emergency Medicine

## 2021-01-28 ENCOUNTER — Other Ambulatory Visit: Payer: Self-pay

## 2021-01-28 DIAGNOSIS — I5033 Acute on chronic diastolic (congestive) heart failure: Secondary | ICD-10-CM | POA: Diagnosis not present

## 2021-01-28 DIAGNOSIS — S060X0A Concussion without loss of consciousness, initial encounter: Secondary | ICD-10-CM | POA: Insufficient documentation

## 2021-01-28 DIAGNOSIS — Z86010 Personal history of colonic polyps: Secondary | ICD-10-CM | POA: Insufficient documentation

## 2021-01-28 DIAGNOSIS — Z79899 Other long term (current) drug therapy: Secondary | ICD-10-CM | POA: Insufficient documentation

## 2021-01-28 DIAGNOSIS — Y9301 Activity, walking, marching and hiking: Secondary | ICD-10-CM | POA: Insufficient documentation

## 2021-01-28 DIAGNOSIS — Y92019 Unspecified place in single-family (private) house as the place of occurrence of the external cause: Secondary | ICD-10-CM | POA: Diagnosis not present

## 2021-01-28 DIAGNOSIS — S92511A Displaced fracture of proximal phalanx of right lesser toe(s), initial encounter for closed fracture: Secondary | ICD-10-CM | POA: Diagnosis not present

## 2021-01-28 DIAGNOSIS — Z981 Arthrodesis status: Secondary | ICD-10-CM | POA: Diagnosis not present

## 2021-01-28 DIAGNOSIS — I110 Hypertensive heart disease with heart failure: Secondary | ICD-10-CM | POA: Insufficient documentation

## 2021-01-28 DIAGNOSIS — S92501A Displaced unspecified fracture of right lesser toe(s), initial encounter for closed fracture: Secondary | ICD-10-CM | POA: Diagnosis not present

## 2021-01-28 DIAGNOSIS — S92501B Displaced unspecified fracture of right lesser toe(s), initial encounter for open fracture: Secondary | ICD-10-CM | POA: Insufficient documentation

## 2021-01-28 DIAGNOSIS — M47814 Spondylosis without myelopathy or radiculopathy, thoracic region: Secondary | ICD-10-CM | POA: Diagnosis not present

## 2021-01-28 DIAGNOSIS — W108XXA Fall (on) (from) other stairs and steps, initial encounter: Secondary | ICD-10-CM | POA: Insufficient documentation

## 2021-01-28 DIAGNOSIS — M533 Sacrococcygeal disorders, not elsewhere classified: Secondary | ICD-10-CM | POA: Diagnosis not present

## 2021-01-28 DIAGNOSIS — S0990XA Unspecified injury of head, initial encounter: Secondary | ICD-10-CM | POA: Diagnosis not present

## 2021-01-28 DIAGNOSIS — W19XXXA Unspecified fall, initial encounter: Secondary | ICD-10-CM

## 2021-01-28 DIAGNOSIS — M545 Low back pain, unspecified: Secondary | ICD-10-CM | POA: Diagnosis not present

## 2021-01-28 LAB — CBC
HCT: 41.5 % (ref 36.0–46.0)
Hemoglobin: 12.8 g/dL (ref 12.0–15.0)
MCH: 30 pg (ref 26.0–34.0)
MCHC: 30.8 g/dL (ref 30.0–36.0)
MCV: 97.4 fL (ref 80.0–100.0)
Platelets: 274 10*3/uL (ref 150–400)
RBC: 4.26 MIL/uL (ref 3.87–5.11)
RDW: 12.5 % (ref 11.5–15.5)
WBC: 11.5 10*3/uL — ABNORMAL HIGH (ref 4.0–10.5)
nRBC: 0 % (ref 0.0–0.2)

## 2021-01-28 LAB — BASIC METABOLIC PANEL
Anion gap: 10 (ref 5–15)
BUN: 7 mg/dL — ABNORMAL LOW (ref 8–23)
CO2: 26 mmol/L (ref 22–32)
Calcium: 8.4 mg/dL — ABNORMAL LOW (ref 8.9–10.3)
Chloride: 100 mmol/L (ref 98–111)
Creatinine, Ser: 0.96 mg/dL (ref 0.44–1.00)
GFR, Estimated: >60 mL/min (ref >60)
Glucose, Bld: 116 mg/dL — ABNORMAL HIGH (ref 70–99)
Potassium: 3.6 mmol/L (ref 3.5–5.1)
Sodium: 136 mmol/L (ref 135–145)

## 2021-01-28 MED ORDER — OXYCODONE-ACETAMINOPHEN 5-325 MG PO TABS
1.0000 | ORAL_TABLET | Freq: Once | ORAL | Status: AC
  Administered 2021-01-28: 1 via ORAL
  Filled 2021-01-28: qty 1

## 2021-01-28 NOTE — ED Triage Notes (Signed)
Pt arrives to ED with c/o back pain and right foot pain after fall last night. Pt was walking into house and missed steps, falling back and hitting her head. Pt in intense pain in triage.

## 2021-01-28 NOTE — ED Notes (Signed)
Pt transported to xray 

## 2021-01-28 NOTE — Discharge Instructions (Addendum)
You can take 600 mg of ibuprofen every 6 hours, you can take 1000 mg of Tylenol every 6 hours, you can alternate these every 3 or you can take them together.  

## 2021-01-28 NOTE — ED Provider Notes (Signed)
[facility] EMERGENCY DEPARTMENT Provider Note   CSN: [id number] Arrival date & time: 01/28/21  1438     History Chief Complaint  Patient presents with  . Fall    Denise King is a 70 y.o. female.   Fall This is a new problem. The current episode started 12 to 24 hours ago. The problem occurs constantly. The problem has been gradually improving. Associated symptoms include headaches. Pertinent negatives include no chest pain and no shortness of breath. Associated symptoms comments: Back pain, right foot pain . The symptoms are aggravated by standing. The symptoms are relieved by rest. She has tried nothing for the symptoms. The treatment provided no relief.       Past Medical History:  Diagnosis Date  . Acute cystitis   . Acute respiratory failure ([place])   . Allergy    as a child grew out of them  . Anemia    pernicious anemia  . Anxiety   . Arthritis   . Back pain   . Blood transfusion   . CAP (community acquired pneumonia)   . CHF (congestive heart failure) ([place])   . Chronic pain syndrome   . Common bile duct stone   . Depression   . Diverticulosis of colon   . Duodenal ulcer hemorrhagic-after biliary sphincterotomy   . Dysthymia   . Esophagitis   . EtOH dependence ([place])   . Gastritis   . GERD (gastroesophageal reflux disease)   . H/O chest pain Dec. 2013   no work up done  . Hx of colonic polyps   . Hypertension   . Irritable bowel syndrome   . Lymphocytic colitis 02/16/2020  . Osteopenia   . Osteoporosis   . Pneumonia 2019  . Polypharmacy   . Reflux esophagitis   . Right sided sciatica 12/22/2017  . Tobacco use disorder   . Unspecified chronic bronchitis ([place])   . Vertigo   . Vitamin B12 deficiency   . Wears glasses     Patient Active Problem List   Diagnosis Date Noted  . Acute on chronic respiratory failure with hypoxia and hypercapnia ([place]) 07/10/2020  . Acute on chronic heart failure ([place]) 07/04/2020  .  Choledocholithiasis 02/22/2020  . Lymphocytic colitis 02/16/2020  . Duodenal ulcer hemorrhagic   . Malnutrition of moderate degree 01/01/2020  . Acute respiratory failure with hypoxia and hypercapnia ([place]) 12/28/2019  . Bipolar disorder ([place]) 12/28/2019  . Alcohol abuse 12/28/2019  . Acute on chronic heart failure with preserved ejection fraction ([place]) 12/28/2019  . Abnormal MRI of abdomen   . Cholelithiasis   . Common bile duct stone 12/23/2019  . Acute cholecystitis 12/23/2019  . Polycythemia 09/11/2019  . Right knee pain 08/09/2019  . Quadriceps contusion 08/09/2019  . Abnormal cardiac CT angiography 05/11/2019  . Chest pain 04/11/2019  . Acute viral syndrome 04/11/2019  . Tobacco abuse 04/11/2019  . Hyperphosphatemia 04/11/2019  . SOB (shortness of breath) 04/07/2019  . Hip fracture ([place]) 10/01/2018  . Dysuria 05/20/2018  . Right leg swelling 05/20/2018  . CAP (community acquired pneumonia) 05/13/2018  . Tachycardia 05/13/2018  . Chest wall pain 05/13/2018  . Narcotic poisoning ([place]) 05/13/2018  . EtOH dependence ([place])   . Polypharmacy   . Esophagitis   . Preventative health care 12/22/2017  . Erythrocytosis 12/22/2017  . Depression 12/22/2017  . Hyperglycemia 12/22/2017  . Right sided sciatica 12/22/2017  . Cough 06/10/2017  . Family history of colon cancer - brother and father 03/10/2016  .  Insomnia 02/11/2016  . Generalized anxiety disorder 11/25/2015  . Urinary frequency 04/25/2015  . Nausea and vomiting in adult 11/08/2014  . Cigarette smoker 08/15/2013  . Otitis media 10/27/2012  . Abdominal pain 03/30/2012  . HEPATIC CYST 02/18/2010  . Chronic pain syndrome 02/08/2010  . Vitamin B12 deficiency 10/04/2009  . GERD 01/04/2009  . ABDOMINAL PAIN-EPIGASTRIC 01/04/2009  . DYSTHYMIA 08/10/2008  . Venous (peripheral) insufficiency 08/10/2008  . Irritable bowel syndrome 08/10/2008  . Back pain 08/10/2008  . CIGARETTE SMOKER 03/06/2008  . Hx of adenomatous polyp  of colon 12/06/2007  . BRONCHITIS, RECURRENT 12/06/2007  . DIVERTICULOSIS OF COLON 12/06/2007  . OSTEOPENIA 12/06/2007  . CALCULUS, KIDNEY 10/07/2007  . VERTIGO 10/07/2007  . Headache(784.0) 10/07/2007    Past Surgical History:  Procedure Laterality Date  . APPENDECTOMY    . BACK SURGERY  10-09   Dr. Patrice Paradise  . BIOPSY  02/14/2020   Procedure: BIOPSY;  Surgeon: Gatha Mayer, MD;  Location: [facility];  Service: Endoscopy;;  . CARPAL TUNNEL RELEASE Right 08/14/2014   Procedure: RIGHT CARPAL TUNNEL RELEASE AND INJECT LEFT THUMB;  Surgeon: Daryll Brod, MD;  Location: [facility];  Service: Orthopedics;  Laterality: Right;  . CHOLECYSTECTOMY N/A 02/22/2020   Procedure: LAPAROSCOPIC CHOLECYSTECTOMY WITH INTRAOPERATIVE CHOLANGIOGRAM;  Surgeon: Kieth Brightly Arta Bruce, MD;  Location: [facility];  Service: General;  Laterality: N/A;  . COLONOSCOPY    . COLONOSCOPY WITH PROPOFOL N/A 02/14/2020   Procedure: COLONOSCOPY WITH PROPOFOL;  Surgeon: Gatha Mayer, MD;  Location: [facility];  Service: Endoscopy;  Laterality: N/A;  . ENDOSCOPIC RETROGRADE CHOLANGIOPANCREATOGRAPHY (ERCP) WITH PROPOFOL N/A 12/25/2019   Procedure: ENDOSCOPIC RETROGRADE CHOLANGIOPANCREATOGRAPHY (ERCP) WITH PROPOFOL;  Surgeon: Gatha Mayer, MD;  Location: [facility];  Service: Gastroenterology;  Laterality: N/A;  . ESOPHAGOGASTRODUODENOSCOPY (EGD) WITH PROPOFOL N/A 01/06/2020   Procedure: ESOPHAGOGASTRODUODENOSCOPY (EGD) WITH PROPOFOL;  Surgeon: Irene Shipper, MD;  Location[facility];  Service: Endoscopy;  Laterality: N/A;  . HOT HEMOSTASIS N/A 01/06/2020   Procedure: HOT HEMOSTASIS (ARGON PLASMA COAGULATION/BICAP);  Surgeon: Irene Shipper, MD;  Location: [facility] END[facility]COPY;  Service: Endoscopy;  Laterality: N/A;  . INTRAMEDULLARY (IM) NAIL INTERTROCHANTERIC Right 10/01/2018   Procedure: INTRAMEDULLARY (IM) NAIL INTERTROCHANTRIC;  Surgeon: Thornton Park, MD;  Location: [facility] ORS;  Service: Orthopedics;  Laterality:  Right;  . LAPAROSCOPY N/A 02/21/2013   Procedure: LAPAROSCOPY OPERATIVE;  Surgeon: Margarette Asal, MD;  Location: [facility];  Service: Gynecology;  Laterality: N/A;  REQUESTING 5MM SCOPE WITH CAMERA  . LEFT HEART CATH AND CORONARY ANGIOGRAPHY N/A 05/11/2019   Procedure: LEFT HEART CATH AND CORONARY ANGIOGRAPHY;  Surgeon: Sherren Mocha, MD;  Location: [facility];  Service: Cardiovascular;  Laterality: N/A;  . REMOVAL OF STONES  12/25/2019   Procedure: Karlyn Agee;  Surgeon: Gatha Mayer, MD;  Location[facility];  Service: Gastroenterology;;  . Clide Deutscher  01/06/2020   Procedure: Clide Deutscher;  Surgeon: Irene Shipper, MD;  Location: [facility] ENDOSCOPY;  Service: Endoscopy;;  . Joan Mayans  12/25/2019   Procedure: SPHINCTEROTOMY;  Surgeon: Gatha Mayer, MD;  Location: [facility];  Service: Gastroenterology;;  . TONSILLECTOMY    . TUBAL LIGATION    . UPPER GASTROINTESTINAL ENDOSCOPY       OB History   No obstetric history on file.     Family History  Problem Relation Age of Onset  . Colon cancer Father   . Prostate cancer Father   . Heart disease Mother  prev MVR, also has DJD  . Colon cancer Brother   . Skin cancer Sister   . Alcohol abuse Daughter   . Bipolar disorder Daughter   . Drug abuse Daughter   . Esophageal cancer Neg Hx   . Rectal cancer Neg Hx   . Stomach cancer Neg Hx     Social History   Tobacco Use  . Smoking status: Current Every Day Smoker    Packs/day: 1.00    Years: 30.00    Pack years: 30.00    Types: Cigarettes  . Smokeless tobacco: Never Used  Vaping Use  . Vaping Use: Never used  Substance Use Topics  . Alcohol use: Yes    Alcohol/week: 3.0 standard drinks    Types: 3 Standard drinks or equivalent per week    Comment: occasional  . Drug use: No    Home Medications Prior to Admission medications   Medication Sig Start Date End Date Taking? Authorizing Provider  aspirin EC 81 MG tablet Take 1 tablet (81 mg total)  by mouth daily. 04/22/20   Josue Hector, MD  budesonide (ENTOCORT EC) 3 MG 24 hr capsule TAKE 3 CAPSULES ONCE DAILY. Patient taking differently: Take 9 mg by mouth daily.  05/15/20   Gatha Mayer, MD  buPROPion (WELLBUTRIN SR) 150 MG 12 hr tablet Take 1 tablet (150 mg total) by mouth daily. 10/21/20 04/19/21  Pucilowski, Marchia Bond, MD  Cholecalciferol (VITAMIN D) 50 MCG (2000 UT) tablet Take 2,000 Units by mouth daily after breakfast.     [provider]  denosumab (PROLIA) 60 MG/ML SOSY injection Inject 60 mg into the skin every 6 (six) months.    [provider]  Diphenoxylate-Atropine (LOMOTIL PO) Lomotil  1 po qid prn    [provider]  folic acid (FOLVITE) 1 MG tablet Take 1 tablet (1 mg total) by mouth daily. Patient taking differently: Take 1 mg by mouth daily after breakfast.  09/05/18   Biagio Borg, MD  gabapentin (NEURONTIN) 300 MG capsule Take 300 mg by mouth 3 (three) times daily.    [provider]  hyoscyamine (ANASPAZ) 0.125 MG TBDP disintergrating tablet Place 0.125 mg under the tongue every 6 (six) hours as needed ([name] cramps).     [provider]  morphine (MS CONTIN) 15 MG 12 hr tablet Take 15 mg by mouth 3 (three) times daily. 07/19/20   [provider]  Multiple Vitamin (MULTIVITAMIN WITH MINERALS) TABS tablet Take 1 tablet by mouth daily. 01/05/20   Swayze, Ava, DO  nicotine (NICODERM CQ - DOSED IN MG/24 HOURS) 21 mg/24hr patch Place 1 patch (21 mg total) onto the skin daily. 01/05/20   Swayze, Ava, DO  nitroGLYCERIN (NITROSTAT) 0.4 MG SL tablet Place 1 tablet (0.4 mg total) under the tongue every 5 (five) minutes as needed for chest pain. 04/22/20   Josue Hector, MD  ondansetron (ZOFRAN-ODT) 4 MG disintegrating tablet DISSOLVE 1 TABLET ON TONGUE EVERY 8 HOURS AS NEEDED FOR NAUSEA/VOMITING. 10/14/20   Gatha Mayer, MD  Oxycodone HCl 10 MG TABS Take 10 mg by mouth 4 (four) times daily.  03/06/19   [provider]   pantoprazole (PROTONIX) 40 MG tablet TAKE (1) TABLET TWICE A DAY BEFORE MEALS. Patient taking differently: Take 40 mg by mouth 2 (two) times daily with a meal.  05/02/20   Gatha Mayer, MD  potassium chloride (MICRO-K) 10 MEQ CR capsule Take 1 capsule (10 mEq total) by mouth 2 (  two) times daily. 08/23/20   Biagio Borg, MD  rosuvastatin (CRESTOR) 5 MG tablet Take 1 tablet (5 mg total) by mouth 3 (three) times a week. 08/28/20   Josue Hector, MD  sertraline (ZOLOFT) 100 MG tablet Take 2 tablets (200 mg total) by mouth at bedtime. 11/15/20 05/14/21  Pucilowski, Marchia Bond, MD  spironolactone (ALDACTONE) 25 MG tablet Take 0.5 tablets (12.5 mg total) by mouth daily. 04/22/20   Josue Hector, MD  valACYclovir (VALTREX) 500 MG tablet Take 500 mg by mouth 2 (two) times daily as needed (flare ups).     [provider]  vitamin B-12 (CYANOCOBALAMIN) 100 MCG tablet Take 1 tablet (100 mcg total) by mouth daily. Patient taking differently: Take 100 mcg by mouth daily after breakfast.  11/03/18   Biagio Borg, MD    Allergies    Atorvastatin, Levofloxacin, Omnicef [cefdinir], Trazodone and nefazodone, Sulfa antibiotics, and Sulfonamide derivatives  Review of Systems   Review of Systems  Constitutional: Negative for chills and fever.  HENT: Negative for congestion and rhinorrhea.   Respiratory: Negative for cough and shortness of breath.   Cardiovascular: Negative for chest pain and palpitations.  Gastrointestinal: Negative for diarrhea, nausea and vomiting.  Genitourinary: Negative for difficulty urinating and dysuria.  Musculoskeletal: Positive for arthralgias, back pain and joint swelling.  Skin: Negative for rash and wound.  Neurological: Positive for headaches. Negative for weakness, light-headedness and numbness.  Psychiatric/Behavioral: Positive for confusion and decreased concentration.    Physical Exam Updated Vital Signs BP (!) 118/58   Pulse 92   Temp 98.4 F (36.9 C) (Oral)    Resp (!) 24   SpO2 (!) 88%   Physical Exam Vitals and nursing note reviewed. Exam conducted with a chaperone present.  Constitutional:      General: She is not in acute distress.    Appearance: Normal appearance.  HENT:     Head: Normocephalic and atraumatic.     Nose: No rhinorrhea.  Eyes:     General:        Right eye: No discharge.        Left eye: No discharge.     Conjunctiva/sclera: Conjunctivae normal.  Cardiovascular:     Rate and Rhythm: Normal rate and regular rhythm.     Heart sounds: No murmur heard. No gallop.   Pulmonary:     Effort: Pulmonary effort is normal. No respiratory distress.     Breath sounds: No stridor. No wheezing or rales.  Abdominal:     General: Abdomen is flat. There is no distension.     Palpations: Abdomen is soft.  Musculoskeletal:        General: Swelling, tenderness and signs of injury present.     Comments: Swelling and tenderness at the base of the right fourth toe, sensation intact capillary refill intact distal.  No midline spinal tenderness.  But tenderness to the distal sacrum and coccyx.  No extremity tenderness.  Neurovascular intact all extremities  Skin:    General: Skin is warm and dry.  Neurological:     General: No focal deficit present.     Mental Status: She is alert. Mental status is at baseline.     Motor: No weakness.     Comments: Alert oriented x3, no focal weaknesses in the upper lower extremity sensation intact throughout, symmetric facial expressions no slurred speech.  Psychiatric:        Mood and Affect: Mood normal.  Behavior: Behavior normal.     ED Results / Procedures / Treatments   Labs (all labs ordered are listed, but only abnormal results are displayed) Labs Reviewed  CBC - Abnormal; Notable for the following components:      Result Value   WBC 11.5 (*)    All other components within normal limits  BASIC METABOLIC PANEL - Abnormal; Notable for the following components:   Glucose, Bld 116  (*)    BUN 7 (*)    Calcium 8.4 (*)    All other components within normal limits    EKG None  Radiology DG Thoracic Spine 2 View  Result Date: 01/28/2021 CLINICAL DATA:  Pain status post fall EXAM: THORACIC SPINE 2 VIEWS COMPARISON:  None. FINDINGS: There is an S-shaped curvature of the thoracolumbar spine. No acute compression fracture. There is osteopenia. Degenerative changes are noted of the thoracic spine. IMPRESSION: Negative. Electronically Signed   By: Trynity Holster M.D.   On: 01/28/2021 17:57   DG Lumbar Spine 2-3 Views  Result Date: 01/28/2021 CLINICAL DATA:  Pain status post fall EXAM: LUMBAR SPINE - 2-3 VIEW COMPARISON:  None. FINDINGS: The patient is status post prior L4 through S1 posterior fusion. There is severe disc height loss at the L3-L4 disc space. There is no definite acute compression fracture. Vascular calcifications are noted. IMPRESSION: Negative. Electronically Signed   By: Shaaron Holster M.D.   On: 01/28/2021 17:55   DG Sacrum/Coccyx  Result Date: 01/28/2021 CLINICAL DATA:  70 year old female with fall and pain in the tailbone. EXAM: SACRUM AND COCCYX - 2+ VIEW COMPARISON:  CT abdomen pelvis dated 06/11/2020. FINDINGS: No acute fracture. Probable old healed fracture of the mid sacrum. The bones are osteopenic. L4-S1 posterior fusion. Partially visualized right femoral hardware. The soft tissues are unremarkable. IMPRESSION: No acute fracture. Electronically Signed   By: Anner Crete M.D.   On: 01/28/2021 21:13   CT Head Wo Contrast  Result Date: 01/28/2021 CLINICAL DATA:  Fall, hit back of head EXAM: CT HEAD WITHOUT CONTRAST TECHNIQUE: Contiguous axial images were obtained from the base of the skull through the vertex without intravenous contrast. COMPARISON:  07/04/2020 FINDINGS: Brain: There is no acute intracranial hemorrhage, mass effect, or edema. Gray-white differentiation is preserved. There is no extra-axial fluid collection. Ventricles and sulci  are within normal limits in size and configuration. Vascular: There is atherosclerotic calcification at the skull base. Skull: Calvarium is unremarkable. Sinuses/Orbits: No acute finding. Other: None. IMPRESSION: No evidence of acute traumatic injury. Electronically Signed   By: Macy Mis M.D.   On: 01/28/2021 21:23   DG Foot Complete Right  Result Date: 01/28/2021 CLINICAL DATA:  Pain status post fall EXAM: RIGHT FOOT COMPLETE - 3+ VIEW COMPARISON:  None. FINDINGS: There is an acute oblique minimally displaced fracture through the proximal phalanx of the fourth digit. There is no dislocation. IMPRESSION: Acute oblique minimally displaced fracture through the proximal phalanx of the fourth digit. Electronically Signed   By: Julliana Holster M.D.   On: 01/28/2021 18:01    Procedures Procedures   Medications Ordered in ED Medications  oxyCODONE-acetaminophen (PERCOCET/ROXICET) 5-325 MG per tablet 1 tablet (1 tablet Oral Given 01/28/21 2116)    ED Course  I have reviewed the triage vital signs and the nursing notes.  Pertinent labs & imaging results that were available during my care of the patient were reviewed by me and considered in my medical decision making (see chart for details).  Clinical Course as  of 01/28/21 2136  Tue Jan 28, 2021  2039 DG Foot Complete Right [EK]    Clinical Course User Index [EK] Breck Coons, MD   MDM Rules/Calculators/A&P                          Fall from standing, no loss of consciousness but now having concussive type symptoms of headache confusion concentration issues.  No nausea vomiting no abdominal pain no chest pain or shortness of breath.  Toe pain, x-ray reviewed by myself shows a fracture of the fourth metatarsal minimally displaced.  Will get postoperative sandal will get buddy taping will get crutches to weight-bear as tolerated.  X-ray review of the thoracic lumbar spine shows no acute fracture or malalignment and she has no tenderness  there.  We will get a CT scan to rule out traumatic brain injury we will also get a x-ray of the coccyx sacrum.  Will give pain control.  Laboratory studies reviewed by myself are fairly unremarkable for any acute derangements.  Patient was in normal state of health prior to the fall.  It was a mechanical fall.  Patient is able to get around at home she has a walker.  Narcotics given here it made her somnolent however she still will answer questions appropriately.  She has significant history of cigarette smoking, so likely has undiagnosed COPD, no shortness of breath or difficulty breathing, no focal lung sounds.  So for her I would put her oxygen goal at 88% for which she is stable.  X-ray review of the coccyx and CT review of the head of radiology myself is unremarkable for any acute fracture or malalignment.  She feels comfortable is requesting discharge home.  Over-the-counter pain medication recommended.  Likely concussion given patient headache little bit of confusion, outpatient follow-up recommended return precautions discussed  Final Clinical Impression(s) / ED Diagnoses Final diagnoses:  Fall, initial encounter  Concussion without loss of consciousness, initial encounter  Displaced unspecified fracture of right lesser toe(s), initial encounter for open fracture    Rx / DC Orders ED Discharge Orders    None       Breck Coons, MD 01/28/21 2136

## 2021-01-28 NOTE — ED Notes (Signed)
Patient verbalizes understanding of discharge instructions. Opportunity for questioning and answers were provided. Armband removed by staff, pt discharged from ED via wheelchair with family memeber.

## 2021-01-28 NOTE — Progress Notes (Signed)
Orthopedic Tech Progress Note Patient Details:  Denise King 02-18-1951 [id number]  Ortho Devices Type of Ortho Device: Buddy tape,Postop shoe/boot,Crutches Ortho Device/Splint Location: Right Lower Extremity/ 3rd and 4th Toes Ortho Device/Splint Interventions: Ordered,Application   Post Interventions Patient Tolerated: Well Instructions Provided: Adjustment of device,Care of device,Poper ambulation with device   Terence Bart P Lorel Monaco 01/28/2021, 9:45 PM

## 2021-01-29 ENCOUNTER — Other Ambulatory Visit: Payer: Self-pay | Admitting: Internal Medicine

## 2021-01-29 ENCOUNTER — Telehealth: Payer: Self-pay | Admitting: Physician Assistant

## 2021-01-29 MED ORDER — PANTOPRAZOLE SODIUM 40 MG PO TBEC: DELAYED_RELEASE_TABLET | ORAL | 11 refills | Status: AC

## 2021-01-29 NOTE — Telephone Encounter (Signed)
Patient needs to refill Pantoprazole. Please send it to [facility].

## 2021-01-29 NOTE — Telephone Encounter (Signed)
Rx sent to pharmacy as requested.

## 2021-01-31 ENCOUNTER — Other Ambulatory Visit: Payer: Self-pay

## 2021-01-31 ENCOUNTER — Ambulatory Visit (INDEPENDENT_AMBULATORY_CARE_PROVIDER_SITE_OTHER): Payer: Medicare HMO | Admitting: Internal Medicine

## 2021-01-31 ENCOUNTER — Encounter: Payer: Self-pay | Admitting: Internal Medicine

## 2021-01-31 VITALS — BP 130/70 | HR 92 | Temp 98.4°F

## 2021-01-31 DIAGNOSIS — S92504D Nondisplaced unspecified fracture of right lesser toe(s), subsequent encounter for fracture with routine healing: Secondary | ICD-10-CM | POA: Diagnosis not present

## 2021-01-31 DIAGNOSIS — R739 Hyperglycemia, unspecified: Secondary | ICD-10-CM | POA: Diagnosis not present

## 2021-01-31 DIAGNOSIS — H43393 Other vitreous opacities, bilateral: Secondary | ICD-10-CM | POA: Diagnosis not present

## 2021-01-31 DIAGNOSIS — R69 Illness, unspecified: Secondary | ICD-10-CM | POA: Diagnosis not present

## 2021-01-31 DIAGNOSIS — G894 Chronic pain syndrome: Secondary | ICD-10-CM

## 2021-01-31 DIAGNOSIS — S060X0D Concussion without loss of consciousness, subsequent encounter: Secondary | ICD-10-CM | POA: Diagnosis not present

## 2021-01-31 DIAGNOSIS — F331 Major depressive disorder, recurrent, moderate: Secondary | ICD-10-CM

## 2021-01-31 MED ORDER — DULOXETINE HCL 30 MG PO CPEP: 30.0000 mg | ORAL_CAPSULE | Freq: Every day | ORAL | 3 refills | Status: DC

## 2021-01-31 NOTE — Patient Instructions (Signed)
Please take all new medication as prescribed - the cymbalta 30 mg per day  Please continue all other medications as before, and refills have been done if requested.  Please have the pharmacy call with any other refills you may need.  Please keep your appointments with your specialists as you may have planned  You will be contacted regarding the referral for: eye doctor (Dr Roderic Palau) for the new floaters to rule out retinal detachments  Please make an Appointment to return in 3 months

## 2021-01-31 NOTE — Progress Notes (Signed)
Established Patient Office Visit  Subjective:  Patient ID: Denise King, female    DOB: July 14, 1951  Age: 70 y.o. MRN: [id number]      Chief Complaint: follow up recent fall        HPI:  Denise King is a 70 y.o. female here with above and seen in ED feb 1; pt unfortunately was up in dark at night and unfortuantely fell at 2AM trying to get the dog in from outside, sunokt tripped on welcome mat, and fell backwards, suffering mild concussion with HA, 2 broken toes (4th and 5th right foot), and coccydynia/back pain.  ED evaluation included multiple imaging with tspine, ls spine, right foot, sacral/coccyx, and CT head without acute except for toe fractures.  Today, Pt denies new neurological symptoms such as facial or extremity weakness or numbness, but does c/o mild occipital soreness and dizziness and unstead on her feet, husband had to bring her here today, but also Hit back of head and bounced, now with 1 wk of bilateral floaters, new for her.  Has no discernable vision loss but clearly sees dark spots laterally to both sides and wants to swipe at them with the hand to the air in front of her, but then realizes there is nothing there.  The worst pain is the toe fractures today, foot in boot and helps as well as the buddy taping.  Also c/o persistent mild coccydynia aggrevated it seems by more sitting due to the toe fracture pain and less walking, though she does have walker at home.  Continues to follow with pain clinic.  O/w c/o persistent depression, situationlly worse with the fall, at least moderate with her hx of chronic persistent resistant to treatment.  Pt states she may have tried cymbalta in past, but willing to try again as can help some pain asa well.  Denies SI or hi or need for referral for counseling Wt Readings from Last 3 Encounters:  10/22/20 115 lb (52.2 kg)  08/23/20 110 lb (49.9 kg)  07/25/20 109 lb (49.4 kg)   BP Readings from Last 3 Encounters:  01/31/21 130/70   01/28/21 122/60  10/22/20 114/77         Past Medical History:  Diagnosis Date  . Acute cystitis   . Acute respiratory failure ([place])   . Allergy    as a child grew out of them  . Anemia    pernicious anemia  . Anxiety   . Arthritis   . Back pain   . Blood transfusion   . CAP (community acquired pneumonia)   . CHF (congestive heart failure) ([place])   . Chronic pain syndrome   . Common bile duct stone   . Depression   . Diverticulosis of colon   . Duodenal ulcer hemorrhagic-after biliary sphincterotomy   . Dysthymia   . Esophagitis   . EtOH dependence ([place])   . Gastritis   . GERD (gastroesophageal reflux disease)   . H/O chest pain Dec. 2013   no work up done  . Hx of colonic polyps   . Hypertension   . Irritable bowel syndrome   . Lymphocytic colitis 02/16/2020  . Osteopenia   . Osteoporosis   . Pneumonia 2019  . Polypharmacy   . Reflux esophagitis   . Right sided sciatica 12/22/2017  . Tobacco use disorder   . Unspecified chronic bronchitis ([place])   . Vertigo   . Vitamin B12 deficiency   . Wears glasses  Past Surgical History:  Procedure Laterality Date  . APPENDECTOMY    . BACK SURGERY  10-09   Dr. Patrice Paradise  . BIOPSY  02/14/2020   Procedure: BIOPSY;  Surgeon: Gatha Mayer, MD;  Location: [facility];  Service: Endoscopy;;  . CARPAL TUNNEL RELEASE Right 08/14/2014   Procedure: RIGHT CARPAL TUNNEL RELEASE AND INJECT LEFT THUMB;  Surgeon: Daryll Brod, MD;  Location: [facility];  Service: Orthopedics;  Laterality: Right;  . CHOLECYSTECTOMY N/A 02/22/2020   Procedure: LAPAROSCOPIC CHOLECYSTECTOMY WITH INTRAOPERATIVE CHOLANGIOGRAM;  Surgeon: Kieth Brightly Arta Bruce, MD;  Location: [facility];  Service: General;  Laterality: N/A;  . COLONOSCOPY    . COLONOSCOPY WITH PROPOFOL N/A 02/14/2020   Procedure: COLONOSCOPY WITH PROPOFOL;  Surgeon: Gatha Mayer, MD;  Location: [facility];  Service: Endoscopy;  Laterality: N/A;  . ENDOSCOPIC RETROGRADE  CHOLANGIOPANCREATOGRAPHY (ERCP) WITH PROPOFOL N/A 12/25/2019   Procedure: ENDOSCOPIC RETROGRADE CHOLANGIOPANCREATOGRAPHY (ERCP) WITH PROPOFOL;  Surgeon: Gatha Mayer, MD;  Location: [facility];  Service: Gastroenterology;  Laterality: N/A;  . ESOPHAGOGASTRODUODENOSCOPY (EGD) WITH PROPOFOL N/A 01/06/2020   Procedure: ESOPHAGOGASTRODUODENOSCOPY (EGD) WITH PROPOFOL;  Surgeon: Irene Shipper, MD;  Location: [facility];  Service: Endoscopy;  Laterality: N/A;  . HOT HEMOSTASIS N/A 01/06/2020   Procedure: HOT HEMOSTASIS (ARGON PLASMA COAGULATION/BICAP);  Surgeon: Irene Shipper, MD;  Location: [facility] ENDOSCOPY;  Service: Endoscopy;  Laterality: N/A;  . INTRAMEDULLARY (IM) NAIL INTERTROCHANTERIC Right 10/01/2018   Procedure: INTRAMEDULLARY (IM) NAIL INTERTROCHANTRIC;  Surgeon: Thornton Park, MD;  Location: [facility];  Service: Orthopedics;  Laterality: Right;  . LAPAROSCOPY N/A 02/21/2013   Procedure: LAPAROSCOPY OPERATIVE;  Surgeon: Margarette Asal, MD;  Location: [facility];  Service: Gynecology;  Laterality: N/A;  REQUESTING 5MM SCOPE WITH CAMERA  . LEFT HEART CATH AND CORONARY ANGIOGRAPHY N/A 05/11/2019   Procedure: LEFT HEART CATH AND CORONARY ANGIOGRAPHY;  Surgeon: Sherren Mocha, MD;  Location: [facility];  Service: Cardiovascular;  Laterality: N/A;  . REMOVAL OF STONES  12/25/2019   Procedure: Karlyn Agee;  Surgeon: Gatha Mayer, MD;  Location: [facility] ENDOSCOPY;  Service: Gastroenterology;;  . Clide Deutscher  01/06/2020   Procedure: Clide Deutscher;  Surgeon: Irene Shipper, MD;  Location: [facility];  Service: Endoscopy;;  . Joan Mayans  12/25/2019   Procedure: SPHINCTEROTOMY;  Surgeon: Gatha Mayer, MD;  Location: [place] [facility];  Service: Gastroenterology;;  . TONSILLECTOMY    . TUBAL LIGATION    . UPPER GASTROINTESTINAL ENDOSCOPY      reports that she has been smoking cigarettes. She has a 30.00 pack-year smoking history. She has never used smokeless tobacco. She reports current alcohol  use of about 3.0 standard drinks of alcohol per week. She reports that she does not use drugs. family history includes Alcohol abuse in her daughter; Bipolar disorder in her daughter; Colon cancer in her brother and father; Drug abuse in her daughter; Heart disease in her mother; Prostate cancer in her father; Skin cancer in her sister. Allergies  Allergen Reactions  . Atorvastatin Nausea And Vomiting  . Levofloxacin Other (See Comments)    Reaction: achilles tendon pain  . Omnicef [Cefdinir] Diarrhea    Uncontrollable diarrhea  . Trazodone And Nefazodone Other (See Comments)    "Felt like I was going to faint"  . Sulfa Antibiotics Rash  . Sulfonamide Derivatives Rash   Current Outpatient Medications on File Prior to Visit  Medication Sig Dispense Refill  . budesonide (ENTOCORT EC) 3 MG 24 hr capsule TAKE 3 CAPSULES ONCE  DAILY. (Patient taking differently: Take 9 mg by mouth daily.) 90 capsule 1  . buPROPion (WELLBUTRIN SR) 150 MG 12 hr tablet Take 1 tablet (150 mg total) by mouth daily. 90 tablet 1  . denosumab (PROLIA) 60 MG/ML SOSY injection Inject 60 mg into the skin every 6 (six) months.    . folic acid (FOLVITE) 1 MG tablet Take 1 tablet (1 mg total) by mouth daily. (Patient taking differently: Take 1 mg by mouth daily after breakfast.) 90 tablet 3  . gabapentin (NEURONTIN) 300 MG capsule Take 300 mg by mouth 3 (three) times daily.    . hyoscyamine (ANASPAZ) 0.125 MG TBDP disintergrating tablet Place 0.125 mg under the tongue every 6 (six) hours as needed ([name] cramps).     . morphine (MS CONTIN) 15 MG 12 hr tablet Take 15 mg by mouth 3 (three) times daily.    . Multiple Vitamin (MULTIVITAMIN WITH MINERALS) TABS tablet Take 1 tablet by mouth daily. 30 tablet 0  . nicotine (NICODERM CQ - DOSED IN MG/24 HOURS) 21 mg/24hr patch Place 1 patch (21 mg total) onto the skin daily. 28 patch 0  . nitroGLYCERIN (NITROSTAT) 0.4 MG SL tablet Place 1 tablet (0.4 mg total) under the tongue every 5  (five) minutes as needed for chest pain. 25 tablet 3  . ondansetron (ZOFRAN-ODT) 4 MG disintegrating tablet DISSOLVE 1 TABLET ON TONGUE EVERY 8 HOURS AS NEEDED FOR NAUSEA/VOMITING. 15 tablet 0  . Oxycodone HCl 10 MG TABS Take 10 mg by mouth 4 (four) times daily.     . pantoprazole (PROTONIX) 40 MG tablet TAKE (1) TABLET TWICE A DAY BEFORE MEALS. 60 tablet 11  . potassium chloride (MICRO-K) 10 MEQ CR capsule Take 1 capsule (10 mEq total) by mouth 2 (two) times daily. 90 capsule 3  . rosuvastatin (CRESTOR) 5 MG tablet Take 1 tablet (5 mg total) by mouth 3 (three) times a week. 39 tablet 3  . sertraline (ZOLOFT) 100 MG tablet Take 2 tablets (200 mg total) by mouth at bedtime. 180 tablet 1  . valACYclovir (VALTREX) 500 MG tablet Take 500 mg by mouth 2 (two) times daily as needed (flare ups).     Marland Kitchen aspirin EC 81 MG tablet Take 1 tablet (81 mg total) by mouth daily. (Patient not taking: Reported on 01/31/2021)    . Cholecalciferol (VITAMIN D) 50 MCG (2000 UT) tablet Take 2,000 Units by mouth daily after breakfast.  (Patient not taking: Reported on 01/31/2021)    . Diphenoxylate-Atropine (LOMOTIL PO) Lomotil  1 po qid prn (Patient not taking: Reported on 01/31/2021)    . spironolactone (ALDACTONE) 25 MG tablet Take 0.5 tablets (12.5 mg total) by mouth daily. (Patient not taking: Reported on 01/31/2021) 45 tablet 3  . vitamin B-12 (CYANOCOBALAMIN) 100 MCG tablet Take 1 tablet (100 mcg total) by mouth daily. (Patient not taking: Reported on 01/31/2021) 90 tablet 3   No current facility-administered medications on file prior to visit.        ROS:  All others reviewed and negative.  Objective        PE:  BP 130/70   Pulse 92   Temp 98.4 F (36.9 C) (Oral)   SpO2 91%                 Constitutional: Pt appears in NAD               HENT: Head: NCAT.  Right Ear: External ear normal.                 Left Ear: External ear normal.                Eyes: . Pupils are equal, round, and reactive to  light. Conjunctivae and EOM are normal               Nose: without d/c or deformity               Neck: Neck supple. Gross normal ROM               Cardiovascular: Normal rate and regular rhythm.                 Pulmonary/Chest: Effort normal and breath sounds without rales or wheezing.                Abd:  Soft, NT, ND, + BS, no organomegaly               Neurological: Pt is alert. At baseline orientation, motor grossly intact               Skin: Skin is warm. No rashes, no other new lesions, LE edema - none; right foot in postop shoe, and toes buddy taped              Psychiatric: Pt behavior is normal without agitation  But depressed affect  Assessment/Plan:  Denise King is a 70 y.o. White or Caucasian [1] female with  has a past medical history of Acute cystitis, Acute respiratory failure (HCC), Allergy, Anemia, Anxiety, Arthritis, Back pain, Blood transfusion, CAP (community acquired pneumonia), CHF (congestive heart failure) (HCC), Chronic pain syndrome, Common bile duct stone, Depression, Diverticulosis of colon, Duodenal ulcer hemorrhagic-after biliary sphincterotomy, Dysthymia, Esophagitis, EtOH dependence (HCC), Gastritis, GERD (gastroesophageal reflux disease), H/O chest pain (Dec. 2013), colonic polyps, Hypertension, Irritable bowel syndrome, Lymphocytic colitis (02/16/2020), Osteopenia, Osteoporosis, Pneumonia (2019), Polypharmacy, Reflux esophagitis, Right sided sciatica (12/22/2017), Tobacco use disorder, Unspecified chronic bronchitis (HCC), Vertigo, Vitamin B12 deficiency, and Wears glasses.   Micro: none  Cardiac tracings I have personally interpreted today:  none  Pertinent Radiological findings (summarize): right foot Jan 28, 2021  IMPRESSION: Acute oblique minimally displaced fracture through the proximal phalanx of the fourth digit.    Lab Results  Component Value Date   WBC 11.5 (H) 01/28/2021   HGB 12.8 01/28/2021   HCT 41.5 01/28/2021   PLT 274 01/28/2021    GLUCOSE 116 (H) 01/28/2021   CHOL 160 05/16/2020   TRIG 153 (H) 05/16/2020   HDL 61 05/16/2020   LDLDIRECT 102.4 05/24/2008   LDLCALC 73 05/16/2020   ALT 7 07/25/2020   AST 13 07/25/2020   NA 136 01/28/2021   K 3.6 01/28/2021   CL 100 01/28/2021   CREATININE 0.96 01/28/2021   BUN 7 (L) 01/28/2021   CO2 26 01/28/2021   TSH 0.499 04/08/2019   INR 1.0 07/04/2020   HGBA1C 5.2 10/01/2018       Assessment & Plan:   Problem List Items Addressed This Visit      High   Toe fracture, right - Primary    Improved pain with conservative measures and continue 3-4 wks, declines podiatry or sport med or ortho for now for f/u        Medium   Hyperglycemia    Lab Results  Component Value Date   HGBA1C 5.2 10/01/2018  Stable, pt to continue current medical treatment  - diet       Floaters, bilateral    New onset subjective, cant r/o retinal detachment, for urgent optho referral      Relevant Orders   Ambulatory referral to Ophthalmology   Depression    Ok for cymbalta 30 qd, consider 60 mg in 3 wks if not improving, declines referral to psychiatry      Relevant Medications   DULoxetine (CYMBALTA) 30 MG capsule   Concussion    Stable, with some persistent dizziness, has support at home, continue to follow with conservative management      Chronic pain syndrome (Chronic)    O/w stable, tylenol for coccydynia, and f/u pain management as planned      Relevant Medications   DULoxetine (CYMBALTA) 30 MG capsule      Meds ordered this encounter  Medications  . DULoxetine (CYMBALTA) 30 MG capsule    Sig: Take 1 capsule (30 mg total) by mouth daily.    Dispense:  90 capsule    Refill:  3    Follow-up: Return in about 3 months (around 04/30/2021).   Cathlean Cower, MD 02/01/2021 2:11 PM [facility]

## 2021-02-01 ENCOUNTER — Encounter: Payer: Self-pay | Admitting: Internal Medicine

## 2021-02-01 DIAGNOSIS — S060X9A Concussion with loss of consciousness of unspecified duration, initial encounter: Secondary | ICD-10-CM | POA: Insufficient documentation

## 2021-02-01 DIAGNOSIS — S060XAA Concussion with loss of consciousness status unknown, initial encounter: Secondary | ICD-10-CM | POA: Insufficient documentation

## 2021-02-01 DIAGNOSIS — S92911A Unspecified fracture of right toe(s), initial encounter for closed fracture: Secondary | ICD-10-CM | POA: Insufficient documentation

## 2021-02-01 NOTE — Assessment & Plan Note (Signed)
Ok for cymbalta 30 qd, consider 60 mg in 3 wks if not improving, declines referral to psychiatry

## 2021-02-01 NOTE — Assessment & Plan Note (Signed)
Lab Results  Component Value Date   HGBA1C 5.2 10/01/2018   Stable, pt to continue current medical treatment  - diet

## 2021-02-01 NOTE — Assessment & Plan Note (Signed)
Improved pain with conservative measures and continue 3-4 wks, declines podiatry or sport med or ortho for now for f/u

## 2021-02-01 NOTE — Assessment & Plan Note (Signed)
New onset subjective, cant r/o retinal detachment, for urgent optho referral

## 2021-02-01 NOTE — Assessment & Plan Note (Signed)
Stable, with some persistent dizziness, has support at home, continue to follow with conservative management

## 2021-02-01 NOTE — Assessment & Plan Note (Signed)
O/w stable, tylenol for coccydynia, and f/u pain management as planned

## 2021-02-03 DIAGNOSIS — I509 Heart failure, unspecified: Secondary | ICD-10-CM | POA: Diagnosis not present

## 2021-02-07 DIAGNOSIS — M25552 Pain in left hip: Secondary | ICD-10-CM | POA: Diagnosis not present

## 2021-02-07 DIAGNOSIS — S92504A Nondisplaced unspecified fracture of right lesser toe(s), initial encounter for closed fracture: Secondary | ICD-10-CM | POA: Diagnosis not present

## 2021-02-09 ENCOUNTER — Telehealth: Payer: Self-pay | Admitting: Medical

## 2021-02-09 ENCOUNTER — Other Ambulatory Visit: Payer: Self-pay

## 2021-02-09 ENCOUNTER — Inpatient Hospital Stay (HOSPITAL_COMMUNITY)
Admission: EM | Admit: 2021-02-09 | Discharge: 2021-02-21 | DRG: 871 | Disposition: A | Discharge status: Skilled Nursing Facility | Payer: Medicare HMO | Attending: Family Medicine | Admitting: Family Medicine

## 2021-02-09 ENCOUNTER — Encounter (HOSPITAL_COMMUNITY): Payer: Self-pay | Admitting: Emergency Medicine

## 2021-02-09 ENCOUNTER — Emergency Department (HOSPITAL_COMMUNITY): Payer: Medicare HMO

## 2021-02-09 DIAGNOSIS — I110 Hypertensive heart disease with heart failure: Secondary | ICD-10-CM | POA: Diagnosis present

## 2021-02-09 DIAGNOSIS — F419 Anxiety disorder, unspecified: Secondary | ICD-10-CM | POA: Diagnosis present

## 2021-02-09 DIAGNOSIS — R000 Tachycardia, unspecified: Secondary | ICD-10-CM | POA: Diagnosis not present

## 2021-02-09 DIAGNOSIS — G9341 Metabolic encephalopathy: Secondary | ICD-10-CM | POA: Diagnosis not present

## 2021-02-09 DIAGNOSIS — R9082 White matter disease, unspecified: Secondary | ICD-10-CM | POA: Diagnosis not present

## 2021-02-09 DIAGNOSIS — E0781 Sick-euthyroid syndrome: Secondary | ICD-10-CM | POA: Diagnosis not present

## 2021-02-09 DIAGNOSIS — Z20822 Contact with and (suspected) exposure to covid-19: Secondary | ICD-10-CM | POA: Diagnosis not present

## 2021-02-09 DIAGNOSIS — I509 Heart failure, unspecified: Secondary | ICD-10-CM | POA: Diagnosis not present

## 2021-02-09 DIAGNOSIS — R6520 Severe sepsis without septic shock: Secondary | ICD-10-CM | POA: Diagnosis not present

## 2021-02-09 DIAGNOSIS — K219 Gastro-esophageal reflux disease without esophagitis: Secondary | ICD-10-CM | POA: Diagnosis present

## 2021-02-09 DIAGNOSIS — M1612 Unilateral primary osteoarthritis, left hip: Secondary | ICD-10-CM | POA: Diagnosis not present

## 2021-02-09 DIAGNOSIS — Z888 Allergy status to other drugs, medicaments and biological substances status: Secondary | ICD-10-CM

## 2021-02-09 DIAGNOSIS — Z7982 Long term (current) use of aspirin: Secondary | ICD-10-CM

## 2021-02-09 DIAGNOSIS — Z7401 Bed confinement status: Secondary | ICD-10-CM | POA: Diagnosis not present

## 2021-02-09 DIAGNOSIS — R0902 Hypoxemia: Secondary | ICD-10-CM | POA: Diagnosis not present

## 2021-02-09 DIAGNOSIS — M8580 Other specified disorders of bone density and structure, unspecified site: Secondary | ICD-10-CM | POA: Diagnosis present

## 2021-02-09 DIAGNOSIS — Z9851 Tubal ligation status: Secondary | ICD-10-CM

## 2021-02-09 DIAGNOSIS — S32119A Unspecified Zone I fracture of sacrum, initial encounter for closed fracture: Secondary | ICD-10-CM | POA: Diagnosis not present

## 2021-02-09 DIAGNOSIS — R410 Disorientation, unspecified: Secondary | ICD-10-CM | POA: Diagnosis not present

## 2021-02-09 DIAGNOSIS — Z720 Tobacco use: Secondary | ICD-10-CM | POA: Diagnosis not present

## 2021-02-09 DIAGNOSIS — N179 Acute kidney failure, unspecified: Secondary | ICD-10-CM | POA: Diagnosis not present

## 2021-02-09 DIAGNOSIS — M810 Age-related osteoporosis without current pathological fracture: Secondary | ICD-10-CM | POA: Diagnosis not present

## 2021-02-09 DIAGNOSIS — D649 Anemia, unspecified: Secondary | ICD-10-CM | POA: Diagnosis present

## 2021-02-09 DIAGNOSIS — N3000 Acute cystitis without hematuria: Secondary | ICD-10-CM | POA: Diagnosis not present

## 2021-02-09 DIAGNOSIS — Z9049 Acquired absence of other specified parts of digestive tract: Secondary | ICD-10-CM

## 2021-02-09 DIAGNOSIS — F17210 Nicotine dependence, cigarettes, uncomplicated: Secondary | ICD-10-CM | POA: Diagnosis present

## 2021-02-09 DIAGNOSIS — F331 Major depressive disorder, recurrent, moderate: Secondary | ICD-10-CM | POA: Diagnosis not present

## 2021-02-09 DIAGNOSIS — A4151 Sepsis due to Escherichia coli [E. coli]: Principal | ICD-10-CM | POA: Diagnosis present

## 2021-02-09 DIAGNOSIS — J449 Chronic obstructive pulmonary disease, unspecified: Secondary | ICD-10-CM | POA: Diagnosis present

## 2021-02-09 DIAGNOSIS — M79652 Pain in left thigh: Secondary | ICD-10-CM | POA: Diagnosis not present

## 2021-02-09 DIAGNOSIS — I5032 Chronic diastolic (congestive) heart failure: Secondary | ICD-10-CM | POA: Diagnosis present

## 2021-02-09 DIAGNOSIS — W010XXA Fall on same level from slipping, tripping and stumbling without subsequent striking against object, initial encounter: Secondary | ICD-10-CM | POA: Diagnosis not present

## 2021-02-09 DIAGNOSIS — Z800 Family history of malignant neoplasm of digestive organs: Secondary | ICD-10-CM

## 2021-02-09 DIAGNOSIS — Z882 Allergy status to sulfonamides status: Secondary | ICD-10-CM

## 2021-02-09 DIAGNOSIS — B957 Other staphylococcus as the cause of diseases classified elsewhere: Secondary | ICD-10-CM | POA: Diagnosis present

## 2021-02-09 DIAGNOSIS — G9340 Encephalopathy, unspecified: Secondary | ICD-10-CM | POA: Diagnosis not present

## 2021-02-09 DIAGNOSIS — Z743 Need for continuous supervision: Secondary | ICD-10-CM | POA: Diagnosis not present

## 2021-02-09 DIAGNOSIS — G894 Chronic pain syndrome: Secondary | ICD-10-CM | POA: Diagnosis present

## 2021-02-09 DIAGNOSIS — K52832 Lymphocytic colitis: Secondary | ICD-10-CM | POA: Diagnosis not present

## 2021-02-09 DIAGNOSIS — J9611 Chronic respiratory failure with hypoxia: Secondary | ICD-10-CM | POA: Diagnosis not present

## 2021-02-09 DIAGNOSIS — A419 Sepsis, unspecified organism: Secondary | ICD-10-CM

## 2021-02-09 DIAGNOSIS — F32A Depression, unspecified: Secondary | ICD-10-CM | POA: Diagnosis present

## 2021-02-09 DIAGNOSIS — R55 Syncope and collapse: Secondary | ICD-10-CM | POA: Diagnosis not present

## 2021-02-09 DIAGNOSIS — R29810 Facial weakness: Secondary | ICD-10-CM | POA: Diagnosis not present

## 2021-02-09 DIAGNOSIS — Z9981 Dependence on supplemental oxygen: Secondary | ICD-10-CM

## 2021-02-09 DIAGNOSIS — M2550 Pain in unspecified joint: Secondary | ICD-10-CM | POA: Diagnosis not present

## 2021-02-09 DIAGNOSIS — M25552 Pain in left hip: Secondary | ICD-10-CM

## 2021-02-09 DIAGNOSIS — Z86010 Personal history of colonic polyps: Secondary | ICD-10-CM

## 2021-02-09 DIAGNOSIS — R600 Localized edema: Secondary | ICD-10-CM | POA: Diagnosis not present

## 2021-02-09 DIAGNOSIS — S3210XD Unspecified fracture of sacrum, subsequent encounter for fracture with routine healing: Secondary | ICD-10-CM | POA: Diagnosis not present

## 2021-02-09 DIAGNOSIS — Z79891 Long term (current) use of opiate analgesic: Secondary | ICD-10-CM

## 2021-02-09 DIAGNOSIS — M79605 Pain in left leg: Secondary | ICD-10-CM | POA: Diagnosis not present

## 2021-02-09 DIAGNOSIS — N390 Urinary tract infection, site not specified: Secondary | ICD-10-CM | POA: Diagnosis not present

## 2021-02-09 DIAGNOSIS — F319 Bipolar disorder, unspecified: Secondary | ICD-10-CM | POA: Diagnosis not present

## 2021-02-09 DIAGNOSIS — S79912A Unspecified injury of left hip, initial encounter: Secondary | ICD-10-CM | POA: Diagnosis not present

## 2021-02-09 DIAGNOSIS — R69 Illness, unspecified: Secondary | ICD-10-CM | POA: Diagnosis not present

## 2021-02-09 LAB — PROTIME-INR
INR: 1 (ref 0.8–1.2)
Prothrombin Time: 12.9 seconds (ref 11.4–15.2)

## 2021-02-09 LAB — URINALYSIS, ROUTINE W REFLEX MICROSCOPIC
Bilirubin Urine: NEGATIVE
Glucose, UA: NEGATIVE mg/dL
Hgb urine dipstick: NEGATIVE
Ketones, ur: NEGATIVE mg/dL
Leukocytes,Ua: NEGATIVE
Nitrite: POSITIVE — AB
Protein, ur: NEGATIVE mg/dL
Specific Gravity, Urine: 1.025 (ref 1.005–1.030)
pH: 6 (ref 5.0–8.0)

## 2021-02-09 LAB — CBC WITH DIFFERENTIAL/PLATELET
Abs Immature Granulocytes: 0.05 10*3/uL (ref 0.00–0.07)
Basophils Absolute: 0 10*3/uL (ref 0.0–0.1)
Basophils Relative: 0 %
Eosinophils Absolute: 0 10*3/uL (ref 0.0–0.5)
Eosinophils Relative: 0 %
HCT: 34.8 % — ABNORMAL LOW (ref 36.0–46.0)
Hemoglobin: 10.7 g/dL — ABNORMAL LOW (ref 12.0–15.0)
Immature Granulocytes: 1 %
Lymphocytes Relative: 15 %
Lymphs Abs: 1.6 10*3/uL (ref 0.7–4.0)
MCH: 30.1 pg (ref 26.0–34.0)
MCHC: 30.7 g/dL (ref 30.0–36.0)
MCV: 98 fL (ref 80.0–100.0)
Monocytes Absolute: 0.7 10*3/uL (ref 0.1–1.0)
Monocytes Relative: 7 %
Neutro Abs: 8.4 10*3/uL — ABNORMAL HIGH (ref 1.7–7.7)
Neutrophils Relative %: 77 %
Platelets: 313 10*3/uL (ref 150–400)
RBC: 3.55 MIL/uL — ABNORMAL LOW (ref 3.87–5.11)
RDW: 12.3 % (ref 11.5–15.5)
WBC: 10.7 10*3/uL — ABNORMAL HIGH (ref 4.0–10.5)
nRBC: 0 % (ref 0.0–0.2)

## 2021-02-09 LAB — I-STAT VENOUS BLOOD GAS, ED
Acid-Base Excess: 1 mmol/L (ref 0.0–2.0)
Bicarbonate: 27.6 mmol/L (ref 20.0–28.0)
Calcium, Ion: 1.01 mmol/L — ABNORMAL LOW (ref 1.15–1.40)
HCT: 34 % — ABNORMAL LOW (ref 36.0–46.0)
Hemoglobin: 11.6 g/dL — ABNORMAL LOW (ref 12.0–15.0)
O2 Saturation: 99 %
Potassium: 4.2 mmol/L (ref 3.5–5.1)
Sodium: 134 mmol/L — ABNORMAL LOW (ref 135–145)
TCO2: 29 mmol/L (ref 22–32)
pCO2, Ven: 49.8 mmHg (ref 44.0–60.0)
pH, Ven: 7.352 (ref 7.250–7.430)
pO2, Ven: 132 mmHg — ABNORMAL HIGH (ref 32.0–45.0)

## 2021-02-09 LAB — URINALYSIS, MICROSCOPIC (REFLEX): Squamous Epithelial / HPF: NONE SEEN (ref 0–5)

## 2021-02-09 LAB — COMPREHENSIVE METABOLIC PANEL
ALT: 13 U/L (ref 0–44)
AST: 34 U/L (ref 15–41)
Albumin: 3.2 g/dL — ABNORMAL LOW (ref 3.5–5.0)
Alkaline Phosphatase: 68 U/L (ref 38–126)
Anion gap: 12 (ref 5–15)
BUN: 14 mg/dL (ref 8–23)
CO2: 22 mmol/L (ref 22–32)
Calcium: 8.2 mg/dL — ABNORMAL LOW (ref 8.9–10.3)
Chloride: 100 mmol/L (ref 98–111)
Creatinine, Ser: 1.09 mg/dL — ABNORMAL HIGH (ref 0.44–1.00)
GFR, Estimated: 56 mL/min — ABNORMAL LOW (ref >60)
Glucose, Bld: 135 mg/dL — ABNORMAL HIGH (ref 70–99)
Potassium: 4.3 mmol/L (ref 3.5–5.1)
Sodium: 134 mmol/L — ABNORMAL LOW (ref 135–145)
Total Bilirubin: 0.4 mg/dL (ref 0.3–1.2)
Total Protein: 6.2 g/dL — ABNORMAL LOW (ref 6.5–8.1)

## 2021-02-09 LAB — RESP PANEL BY RT-PCR (FLU A&B, COVID) ARPGX2
Influenza A by PCR: NEGATIVE
Influenza B by PCR: NEGATIVE
SARS Coronavirus 2 by RT PCR: NEGATIVE

## 2021-02-09 LAB — APTT: aPTT: 32 seconds (ref 24–36)

## 2021-02-09 LAB — LACTIC ACID, PLASMA: Lactic Acid, Venous: 1.2 mmol/L (ref 0.5–1.9)

## 2021-02-09 MED ORDER — ACETAMINOPHEN 325 MG PO TABS
650.0000 mg | ORAL_TABLET | Freq: Four times a day (QID) | ORAL | Status: DC | PRN
  Administered 2021-02-09 – 2021-02-16 (×4): 650 mg via ORAL
  Filled 2021-02-09 (×4): qty 2

## 2021-02-09 MED ORDER — LACTATED RINGERS IV BOLUS (SEPSIS)
30.0000 mL/kg | Freq: Once | INTRAVENOUS | Status: AC
  Administered 2021-02-09: 1560 mL via INTRAVENOUS

## 2021-02-09 MED ORDER — ACETAMINOPHEN 650 MG RE SUPP: 650.0000 mg | Freq: Four times a day (QID) | RECTAL | Status: DC | PRN

## 2021-02-09 MED ORDER — SODIUM CHLORIDE 0.9 % IV SOLN
1.0000 g | INTRAVENOUS | Status: DC
  Administered 2021-02-10 – 2021-02-11 (×2): 1 g via INTRAVENOUS
  Filled 2021-02-09: qty 1
  Filled 2021-02-09 (×2): qty 10

## 2021-02-09 MED ORDER — BUDESONIDE 3 MG PO CPEP
9.0000 mg | ORAL_CAPSULE | Freq: Every day | ORAL | Status: DC
  Administered 2021-02-11 – 2021-02-21 (×11): 9 mg via ORAL
  Filled 2021-02-09 (×13): qty 3

## 2021-02-09 MED ORDER — NICOTINE 21 MG/24HR TD PT24
21.0000 mg | MEDICATED_PATCH | Freq: Every day | TRANSDERMAL | Status: DC | PRN
  Administered 2021-02-10: 21 mg via TRANSDERMAL
  Filled 2021-02-09: qty 1

## 2021-02-09 MED ORDER — SODIUM CHLORIDE 0.9 % IV SOLN
1.0000 g | Freq: Once | INTRAVENOUS | Status: AC
  Administered 2021-02-09: 1 g via INTRAVENOUS
  Filled 2021-02-09: qty 10

## 2021-02-09 MED ORDER — PANTOPRAZOLE SODIUM 40 MG IV SOLR
40.0000 mg | Freq: Two times a day (BID) | INTRAVENOUS | Status: DC
  Administered 2021-02-10 – 2021-02-11 (×4): 40 mg via INTRAVENOUS
  Filled 2021-02-09 (×5): qty 40

## 2021-02-09 MED ORDER — ONDANSETRON HCL 4 MG/2ML IJ SOLN
4.0000 mg | Freq: Four times a day (QID) | INTRAMUSCULAR | Status: DC | PRN
  Administered 2021-02-11 – 2021-02-15 (×3): 4 mg via INTRAVENOUS
  Filled 2021-02-09 (×4): qty 2

## 2021-02-09 NOTE — Telephone Encounter (Signed)
   Patient's daughter Laretta Alstrom called the after hours line to reports AMS for her mother. She came to the house to check on her after her father reported difficulty arousing Mrs. Ipock. They checked her O2 sats which were down to 59%. O2 was applied and sats improved to low 90s. Patient continues to be difficult to arouse. She reported an episode of emesis and a bloody nose overnight. No complaints of chest pain. She has known COPD with prior issues with aspiration in the setting of AMS with vomiting. She also has several narcotics listed in her medication list. Differential is quite wide, though suspicions low for cardiac etiology. Recommended EMS activation and likely transportation to the ED for further evaluation. Daughter was in agreement with the plan.   Abigail Butts, PA-C  02/09/21; 2:37 PM

## 2021-02-09 NOTE — ED Triage Notes (Signed)
Patient BIB [facility] from home. Per patient husband, patient was lethargic yesterday. Patient had syncopal episode today with some confusion to follow. Patient A&Ox4 upon EMS arrival. Patient with fever of 103 with EMS. Received 1 gram tylenol via EMS. EMS placed patient on 4 L [place]. Patient supposed to be on oxygen at home but has been noncompliant. VSS. NAD.

## 2021-02-09 NOTE — ED Notes (Signed)
Pt is c/o increased pain to left hip since fall several days ago. Dr. Velia Meyer notified and is ordering x-ray.

## 2021-02-09 NOTE — ED Provider Notes (Signed)
[facility]ERG[facility] DEPARTMENT Provider Note   CSN: [id number] Arrival date & time: 02/09/21  1537     History Chief Complaint  Patient presents with  . Fever    Denise King is a 70 y.o. female.  HPI   Patient presents to the ED for evaluation of lethargy and a syncopal episode today. According to the EMS report patient appeared ill yesterday. Today she had a syncopal episode and appeared confused. EMS was called. On arrival the patient was alert and oriented. She did have a fever up to 103. She was given a gram of Tylenol. Patient per EMS is supposed to be on home oxygen but has not been using it. In the ED the patient is somnolent. She denies having any trouble with cough. She denies chest pain. She denies vomiting or diarrhea. She is somewhat vague about whether she is having any urinary discomfort or frequency.  Past Medical History:  Diagnosis Date  . Acute cystitis   . Acute respiratory failure ([place])   . Allergy    as a child grew out of them  . Anemia    pernicious anemia  . Anxiety   . Arthritis   . Back pain   . Blood transfusion   . CAP (community acquired pneumonia)   . CHF (congestive heart failure) ([place])   . Chronic pain syndrome   . Common bile duct stone   . Depression   . Diverticulosis of colon   . Duodenal ulcer hemorrhagic-after biliary sphincterotomy   . Dysthymia   . Esophagitis   . EtOH dependence ([place])   . Gastritis   . GERD (gastroesophageal reflux disease)   . H/O chest pain Dec. 2013   no work up done  . Hx of colonic polyps   . Hypertension   . Irritable bowel syndrome   . Lymphocytic colitis 02/16/2020  . Osteopenia   . Osteoporosis   . Pneumonia 2019  . Polypharmacy   . Reflux esophagitis   . Right sided sciatica 12/22/2017  . Tobacco use disorder   . Unspecified chronic bronchitis ([place])   . Vertigo   . Vitamin B12 deficiency   . Wears glasses     Patient Active Problem List   Diagnosis Date Noted   . Toe fracture, right 02/01/2021  . Concussion 02/01/2021  . Floaters, bilateral 01/31/2021  . Acute on chronic respiratory failure with hypoxia and hypercapnia ([place]) 07/10/2020  . Acute on chronic heart failure ([place]) 07/04/2020  . Choledocholithiasis 02/22/2020  . Lymphocytic colitis 02/16/2020  . Duodenal ulcer hemorrhagic   . Malnutrition of moderate degree 01/01/2020  . Acute respiratory failure with hypoxia and hypercapnia ([place]) 12/28/2019  . Bipolar disorder ([place]) 12/28/2019  . Alcohol abuse 12/28/2019  . Acute on chronic heart failure with preserved ejection fraction ([place]) 12/28/2019  . Abnormal MRI of abdomen   . Cholelithiasis   . Common bile duct stone 12/23/2019  . Acute cholecystitis 12/23/2019  . Polycythemia 09/11/2019  . Right knee pain 08/09/2019  . Quadriceps contusion 08/09/2019  . Abnormal cardiac CT angiography 05/11/2019  . Chest pain 04/11/2019  . Acute viral syndrome 04/11/2019  . Tobacco abuse 04/11/2019  . Hyperphosphatemia 04/11/2019  . SOB (shortness of breath) 04/07/2019  . Hip fracture ([place]) 10/01/2018  . Dysuria 05/20/2018  . Right leg swelling 05/20/2018  . CAP (community acquired pneumonia) 05/13/2018  . Tachycardia 05/13/2018  . Chest wall pain 05/13/2018  . Narcotic poisoning ([place]) 05/13/2018  . EtOH dependence (  [facility])   . Polypharmacy   . Esophagitis   . Preventative health care 12/22/2017  . Erythrocytosis 12/22/2017  . Depression 12/22/2017  . Hyperglycemia 12/22/2017  . Right sided sciatica 12/22/2017  . Cough 06/10/2017  . Family history of colon cancer - brother and father 03/10/2016  . Insomnia 02/11/2016  . Generalized anxiety disorder 11/25/2015  . Urinary frequency 04/25/2015  . Nausea and vomiting in adult 11/08/2014  . Cigarette smoker 08/15/2013  . Otitis media 10/27/2012  . Abdominal pain 03/30/2012  . HEPATIC CYST 02/18/2010  . Chronic pain syndrome 02/08/2010  . Vitamin B12 deficiency 10/04/2009  . GERD 01/04/2009   . ABDOMINAL PAIN-EPIGASTRIC 01/04/2009  . DYSTHYMIA 08/10/2008  . Venous (peripheral) insufficiency 08/10/2008  . Irritable bowel syndrome 08/10/2008  . Back pain 08/10/2008  . CIGARETTE SMOKER 03/06/2008  . Hx of adenomatous polyp of colon 12/06/2007  . BRONCHITIS, RECURRENT 12/06/2007  . DIVERTICULOSIS OF COLON 12/06/2007  . OSTEOPENIA 12/06/2007  . CALCULUS, KIDNEY 10/07/2007  . VERTIGO 10/07/2007  . Headache(784.0) 10/07/2007    Past Surgical History:  Procedure Laterality Date  . APPENDECTOMY    . BACK SURGERY  10-09   Dr. Patrice Paradise  . BIOPSY  02/14/2020   Procedure: BIOPSY;  Surgeon: Gatha Mayer, MD;  Location: [facility];  Service: Endoscopy;;  . CARPAL TUNNEL RELEASE Right 08/14/2014   Procedure: RIGHT CARPAL TUNNEL RELEASE AND INJECT LEFT THUMB;  Surgeon: Daryll Brod, MD;  Location: [facility];  Service: Orthopedics;  Laterality: Right;  . CHOLECYSTECTOMY N/A 02/22/2020   Procedure: LAPAROSCOPIC CHOLECYSTECTOMY WITH INTRAOPERATIVE CHOLANGIOGRAM;  Surgeon: Kieth Brightly Arta Bruce, MD;  Location: [facility];  Service: General;  Laterality: N/A;  . COLONOSCOPY    . COLONOSCOPY WITH PROPOFOL N/A 02/14/2020   Procedure: COLONOSCOPY WITH PROPOFOL;  Surgeon: Gatha Mayer, MD;  Location: [facility];  Service: Endoscopy;  Laterality: N/A;  . ENDOSCOPIC RETROGRADE CHOLANGIOPANCREATOGRAPHY (ERCP) WITH PROPOFOL N/A 12/25/2019   Procedure: ENDOSCOPIC RETROGRADE CHOLANGIOPANCREATOGRAPHY (ERCP) WITH PROPOFOL;  Surgeon: Gatha Mayer, MD;  Location: [facility];  Service: Gastroenterology;  Laterality: N/A;  . ESOPHAGOGASTRODUODENOSCOPY (EGD) WITH PROPOFOL N/A 01/06/2020   Procedure: ESOPHAGOGASTRODUODENOSCOPY (EGD) WITH PROPOFOL;  Surgeon: Irene Shipper, MD;  Location[facility] END[facility]COPY;  Service: Endoscopy;  Laterality: N/A;  . HOT HEMOSTASIS N/A 01/06/2020   Procedure: HOT HEMOSTASIS (ARGON PLASMA COAGULATION/BICAP);  Surgeon: Irene Shipper, MD;  Location: [facility] ENDOSCOPY;  Service:  Endoscopy;  Laterality: N/A;  . INTRAMEDULLARY (IM) NAIL INTERTROCHANTERIC Right 10/01/2018   Procedure: INTRAMEDULLARY (IM) NAIL INTERTROCHANTRIC;  Surgeon: Thornton Park, MD;  Location: [facility] ORS;  Service: Orthopedics;  Laterality: Right;  . LAPAROSCOPY N/A 02/21/2013   Procedure: LAPAROSCOPY OPERATIVE;  Surgeon: Margarette Asal, MD;  Location: [facility];  Service: Gynecology;  Laterality: N/A;  REQUESTING 5MM SCOPE WITH CAMERA  . LEFT HEART CATH AND CORONARY ANGIOGRAPHY N/A 05/11/2019   Procedure: LEFT HEART CATH AND CORONARY ANGIOGRAPHY;  Surgeon: Sherren Mocha, MD;  Location: [facility];  Service: Cardiovascular;  Laterality: N/A;  . REMOVAL OF STONES  12/25/2019   Procedure: Karlyn Agee;  Surgeon: Gatha Mayer, MD;  Location: [facility] ENDOSCOPY;  Service: Gastroenterology;;  . Clide Deutscher  01/06/2020   Procedure: Clide Deutscher;  Surgeon: Irene Shipper, MD;  Location: [facility] ENDOSCOPY;  Service: Endoscopy;;  . Joan Mayans  12/25/2019   Procedure: SPHINCTEROTOMY;  Surgeon: Gatha Mayer, MD;  Location: [facility];  Service: Gastroenterology;;  . TONSILLECTOMY    . TUBAL LIGATION    .  UPPER GASTROINTESTINAL ENDOSCOPY       OB History   No obstetric history on file.     Family History  Problem Relation Age of Onset  . Colon cancer Father   . Prostate cancer Father   . Heart disease Mother        prev MVR, also has DJD  . Colon cancer Brother   . Skin cancer Sister   . Alcohol abuse Daughter   . Bipolar disorder Daughter   . Drug abuse Daughter   . Esophageal cancer Neg Hx   . Rectal cancer Neg Hx   . Stomach cancer Neg Hx     Social History   Tobacco Use  . Smoking status: Current Every Day Smoker    Packs/day: 1.00    Years: 30.00    Pack years: 30.00    Types: Cigarettes  . Smokeless tobacco: Never Used  Vaping Use  . Vaping Use: Never used  Substance Use Topics  . Alcohol use: Yes    Alcohol/week: 3.0 standard drinks    Types: 3 Standard drinks or  equivalent per week    Comment: occasional  . Drug use: No    Home Medications Prior to Admission medications   Medication Sig Start Date End Date Taking? Authorizing Provider  aspirin EC 81 MG tablet Take 1 tablet (81 mg total) by mouth daily. Patient not taking: Reported on 01/31/2021 04/22/20   Josue Hector, MD  budesonide (ENTOCORT EC) 3 MG 24 hr capsule TAKE 3 CAPSULES ONCE DAILY. Patient taking differently: Take 9 mg by mouth daily. 05/15/20   Gatha Mayer, MD  buPROPion (WELLBUTRIN SR) 150 MG 12 hr tablet Take 1 tablet (150 mg total) by mouth daily. 10/21/20 04/19/21  Pucilowski, Marchia Bond, MD  Cholecalciferol (VITAMIN D) 50 MCG (2000 UT) tablet Take 2,000 Units by mouth daily after breakfast.  Patient not taking: Reported on 01/31/2021    [provider]  denosumab (PROLIA) 60 MG/ML SOSY injection Inject 60 mg into the skin every 6 (six) months.    [provider]  Diphenoxylate-Atropine (LOMOTIL PO) Lomotil  1 po qid prn Patient not taking: Reported on 01/31/2021    [provider]  DULoxetine (CYMBALTA) 30 MG capsule Take 1 capsule (30 mg total) by mouth daily. 01/31/21   Biagio Borg, MD  folic acid (FOLVITE) 1 MG tablet Take 1 tablet (1 mg total) by mouth daily. Patient taking differently: Take 1 mg by mouth daily after breakfast. 09/05/18   Biagio Borg, MD  gabapentin (NEURONTIN) 300 MG capsule Take 300 mg by mouth 3 (three) times daily.    [provider]  hyoscyamine (ANASPAZ) 0.125 MG TBDP disintergrating tablet Place 0.125 mg under the tongue every 6 (six) hours as needed ([name] cramps).     [provider]  morphine (MS CONTIN) 15 MG 12 hr tablet Take 15 mg by mouth 3 (three) times daily. 07/19/20   [provider]  Multiple Vitamin (MULTIVITAMIN WITH MINERALS) TABS tablet Take 1 tablet by mouth daily. 01/05/20   Swayze, Ava, DO  nicotine (NICODERM CQ - DOSED IN MG/24 HOURS) 21 mg/24hr patch Place 1 patch (21 mg total) onto the  skin daily. 01/05/20   Swayze, Ava, DO  nitroGLYCERIN (NITROSTAT) 0.4 MG SL tablet Place 1 tablet (0.4 mg total) under the tongue every 5 (five) minutes as needed for chest pain. 04/22/20   Josue Hector, MD  ondansetron (ZOFRAN-ODT) 4 MG disintegrating tablet DISSOLVE 1 TABLET  ON TONGUE EVERY 8 HOURS AS NEEDED FOR NAUSEA/VOMITING. 01/29/21   Gatha Mayer, MD  Oxycodone HCl 10 MG TABS Take 10 mg by mouth 4 (four) times daily.  03/06/19   [provider]  pantoprazole (PROTONIX) 40 MG tablet TAKE (1) TABLET TWICE A DAY BEFORE MEALS. 01/29/21   Gatha Mayer, MD  potassium chloride (MICRO-K) 10 MEQ CR capsule Take 1 capsule (10 mEq total) by mouth 2 (two) times daily. 08/23/20   Biagio Borg, MD  rosuvastatin (CRESTOR) 5 MG tablet Take 1 tablet (5 mg total) by mouth 3 (three) times a week. 08/28/20   Josue Hector, MD  sertraline (ZOLOFT) 100 MG tablet Take 2 tablets (200 mg total) by mouth at bedtime. 11/15/20 05/14/21  Pucilowski, Marchia Bond, MD  spironolactone (ALDACTONE) 25 MG tablet Take 0.5 tablets (12.5 mg total) by mouth daily. Patient not taking: Reported on 01/31/2021 04/22/20   Josue Hector, MD  valACYclovir (VALTREX) 500 MG tablet Take 500 mg by mouth 2 (two) times daily as needed (flare ups).     [provider]  vitamin B-12 (CYANOCOBALAMIN) 100 MCG tablet Take 1 tablet (100 mcg total) by mouth daily. Patient not taking: Reported on 01/31/2021 11/03/18   Biagio Borg, MD    Allergies    Atorvastatin, Levofloxacin, Omnicef [cefdinir], Trazodone and nefazodone, Sulfa antibiotics, and Sulfonamide derivatives  Review of Systems   Review of Systems  All other systems reviewed and are negative.   Physical Exam Updated Vital Signs BP 111/73   Pulse 90   Temp 98.3 F (36.8 C) (Oral)   Resp (!) 23   SpO2 99%   Physical Exam Vitals and nursing note reviewed.  Constitutional:      Appearance: She is well-developed and well-nourished. She is ill-appearing.      Comments: Listless  HENT:     Head: Normocephalic and atraumatic.     Right Ear: External ear normal.     Left Ear: External ear normal.  Eyes:     General: No scleral icterus.       Right eye: No discharge.        Left eye: No discharge.     Conjunctiva/sclera: Conjunctivae normal.  Neck:     Trachea: No tracheal deviation.  Cardiovascular:     Rate and Rhythm: Regular rhythm. Tachycardia present.     Pulses: Intact distal pulses.  Pulmonary:     Effort: Pulmonary effort is normal. No respiratory distress.     Breath sounds: Normal breath sounds. No stridor. No wheezing or rales.     Comments: Occasional cough, decreased breath sounds Abdominal:     General: Bowel sounds are normal. There is no distension.     Palpations: Abdomen is soft.     Tenderness: There is no abdominal tenderness. There is no guarding or rebound.  Musculoskeletal:        General: No tenderness or edema.     Cervical back: Neck supple.  Skin:    General: Skin is warm and dry.     Findings: No rash.  Neurological:     Mental Status: She is alert.     Cranial Nerves: No cranial nerve deficit (no facial droop, extraocular movements intact, no slurred speech).     Sensory: No sensory deficit.     Motor: Weakness ( generalized) present. No abnormal muscle tone or seizure activity.     Coordination: Coordination normal.     Deep Tendon Reflexes: Strength normal.  Comments: Patient alert and oriented, is somewhat somnolent and will fall asleep unless directly spoken to  Psychiatric:        Mood and Affect: Mood and affect normal.     ED Results / Procedures / Treatments   Labs (all labs ordered are listed, but only abnormal results are displayed) Labs Reviewed  COMPREHENSIVE METABOLIC PANEL - Abnormal; Notable for the following components:      Result Value   Sodium 134 (*)    Glucose, Bld 135 (*)    Creatinine, Ser 1.09 (*)    Calcium 8.2 (*)    Total Protein 6.2 (*)    Albumin 3.2 (*)     GFR, Estimated 56 (*)    All other components within normal limits  CBC WITH DIFFERENTIAL/PLATELET - Abnormal; Notable for the following components:   WBC 10.7 (*)    RBC 3.55 (*)    Hemoglobin 10.7 (*)    HCT 34.8 (*)    Neutro Abs 8.4 (*)    All other components within normal limits  URINALYSIS, ROUTINE W REFLEX MICROSCOPIC - Abnormal; Notable for the following components:   APPearance CLOUDY (*)    Nitrite POSITIVE (*)    All other components within normal limits  URINALYSIS, MICROSCOPIC (REFLEX) - Abnormal; Notable for the following components:   Bacteria, UA MANY (*)    All other components within normal limits  I-STAT VENOUS BLOOD GAS, ED - Abnormal; Notable for the following components:   pO2, Ven 132.0 (*)    Sodium 134 (*)    Calcium, Ion 1.01 (*)    HCT 34.0 (*)    Hemoglobin 11.6 (*)    All other components within normal limits  RESP PANEL BY RT-PCR (FLU A&B, COVID) ARPGX2  URINE CULTURE  CULTURE, BLOOD (ROUTINE X 2)  CULTURE, BLOOD (ROUTINE X 2)  LACTIC ACID, PLASMA  PROTIME-INR  APTT  LACTIC ACID, PLASMA    EKG EKG Interpretation  Date/Time:  Sunday February 09 2021 15:42:49 EST Ventricular Rate:  109 PR Interval:    QRS Duration: 128 QT Interval:  355 QTC Calculation: 478 R Axis:   104 Text Interpretation: Sinus tachycardia RBBB and LPFB No significant change since last tracing Confirmed by Dorie Rank [phone]) on 02/09/2021 3:59:00 PM   Radiology DG Chest Port 1 View  Result Date: 02/09/2021 CLINICAL DATA:  Lethargy yesterday. Syncopal episode today with some confusion. EXAM: PORTABLE CHEST 1 VIEW COMPARISON:  07/08/2020 FINDINGS: Cardiac silhouette is normal in size. No mediastinal or hilar masses. Prominent bronchovascular markings. Additional opacity noted the lung bases, greater on the right, consistent with atelectasis and/or scarring. No convincing pneumonia and no evidence of pulmonary edema. No pleural effusion or pneumothorax. Skeletal structures  demineralized, but grossly intact. IMPRESSION: 1. No acute cardiopulmonary disease. Electronically Signed   By: Lajean Manes M.D.   On: 02/09/2021 16:46    Procedures Procedures   Medications Ordered in ED Medications  lactated ringers bolus 1,560 mL (0 mL/kg  52 kg (Order-Specific) Intravenous Stopped 02/09/21 1904)  cefTRIAXone (ROCEPHIN) 1 g in sodium chloride 0.9 % 100 mL IVPB (0 g Intravenous Stopped 02/09/21 2017)    ED Course  I have reviewed the triage vital signs and the nursing notes.  Pertinent labs & imaging results that were available during my care of the patient were reviewed by me and considered in my medical decision making (see chart for details).  Clinical Course as of 02/09/21 2204  Sun Feb 09, 2021  1816  Venous blood gas does not show signs of acidosis or hypercarbia.  Metabolic panel does not show any severe abnormalities. [ZH]  2992 White blood cell count elevated.  Hemoglobin decreased from previous.  No indication for transfusion.  Will monitor. [JK]  4268 Chest x-ray without acute findings. [JK]  3419 Blood pressure remained stable. [JK]  2201 Patient's urinalysis shows positive nitrates many bacteria. Is consistent with infection [JK]    Clinical Course User Index [JK] Dorie Rank, MD   MDM Rules/Calculators/A&P                          Patient presented to the ED for evaluation of altered mental status, fever confusion. Patient had a fever of 103 with EMS. She was given Tylenol. In the ED the patient continued to have low-grade temperature but did defervesce. She has remained hemodynamically stable. No signs of sepsis. Chest x-ray does not show pneumonia. No signs of lactic acidosis. Patient's urinalysis does show urinary tract infection. Patient has remained listless and somnolent. She is on chronic opiates at home. Is possible this is a contributing factor. With her infection and lethargy I will consult the medical service for admission and further  treatment. Patient has been given IV Rocephin. Final Clinical Impression(s) / ED Diagnoses Final diagnoses:  Acute cystitis without hematuria  Metabolic encephalopathy      Dorie Rank, MD 02/09/21 2205

## 2021-02-09 NOTE — H&P (Signed)
History and Physical    PLEASE NOTE THAT DRAGON DICTATION SOFTWARE WAS USED IN THE CONSTRUCTION OF THIS NOTE.   DALLIS DARDEN VQQ:[id number] DOB: Aug 16, 1951 DOA: 02/09/2021  PCP: Biagio Borg, MD Patient coming from: home   I have personally briefly reviewed patient's old medical records in [place]  Chief Complaint: Altered mental status  HPI: ESTEPHANY King is a 70 y.o. female with medical history significant for chronic hypoxic respiratory failure on 2 L continuous supplemental oxygen, COPD, chronic pain syndrome on chronic opioid therapy , chronic diastolic heart failure, depression, who is admitted to [facility] on 02/09/2021 with suspected acute metabolic encephalopathy after presenting from home to [facility] ED for evaluation of altered mental status.   The following history is provided by the patient, her husband, EMS, my discussions with the emergency department physician, and via chart review.  Patient's husband, with whom the patient lives at home, noted the patient to exhibit 1 to 2 days of confusion associated with lethargy and increased somnolence, noting the patient to fall asleep during discussions over that time, which represents a departure from her baseline level of alertness.    The patient reports ground-level fall around 2300 on 02/08/21 in which the trip on the corner of the door frame while attempting to walk back into her house after taking her dogs outside.  She reports that she fell on her left side, landing on her left hip, resulting in worsening of chronic left hip pain.  Correspondingly, she reports worsening of the her sharp left hip pain, with no radiation.  She notes exacerbation of the intensity of this discomfort with ambulation as well as with attempting to bear weight on the left lower extremity, although she acknowledges that she has been able to continue to ambulate over that timeframe in spite of this discomfort.  She reports that she  hit her head as a component of this fall, but does not believe that her head was the primary point of contact as a consequence of the above fall.  She denies any ensuing headache, change in vision, dizziness.  She reports no associated loss of consciousness.  She also denies preceding or ensuing chest pain, palpitations, diaphoresis, presyncope, or syncope. The patient and her husband convey that the patient has exhibited diminished oral intake over the last day, with the patient exhibiting one episode of nonbilious, nonbloody emesis yesterday.  She currently denies any underlying nausea.  She notes 1 to 2 days of new onset dysuria with subjective fever in the absence of chills, full body rigors, or generalized myalgias.  Denies any associated gross hematuria or change in urinary urgency/frequency.  Denies any associated neck stiffness, rhinitis, rhinorrhea, sore throat, shortness of breath, wheezing, cough, nausea, vomiting, abdominal pain, diarrhea, or rash.  No recent traveling or known COVID-19 exposures.  She reports that she has received 2 doses of Pfizer's COVID-19 vaccine in March and April 2021, as well as ensuing booster in November 2021.  Medical history notable for history of chronic pain syndrome on chronic opioid therapy with reported scheduled MS Contin 15 mg p.o. 3 times daily as well as scheduled oxycodone 10 mg p.o. every 6 hours.  This is in addition to use of gabapentin.  Additionally, from my discussions with the inpatient pharmacist, it appears that the patient has been taking Wellbutrin and Zoloft over the last 6 months, with preceding Cymbalta discontinued at least 6 months ago per review of associated psychiatry documentation.  However,  approximately 8 days ago, in the setting of PCP follow-up, there was concern for worsening depression prompting PCP to resume the patient's Cymbalta while continuing existing Wellbutrin and Zoloft.   In the context of he has been his concern for the  patient somnolence over the last 1 to 2 days, EMS was contacted, and upon arrival reportedly noted the patient's temperature to be 103.  Consequently, she received 1 g of acetaminophen in route to [facility] emergency department.   Of note, while EMS documentation conveys a potential syncopal episode over the last day, the patient vehemently denies any recent loss of consciousness.     ED Course:  Vital signs in the ED were notable for the following: Temperature max 100.0; initial heart rate 111, which decreased to 90 following interval administration of IV fluids, as further described above; blood pressure 105/70 - 123/82; respiratory rate 15-23; oxygen saturation 97 to 99% on 4 L [place].  Labs were notable for the following: ABG performed on 4 L nasal cannula showed the following: 7.352/49.9.  CMP was notable for the following: Sodium 134, bicarbonate 22, BUN 14, creatinine 1.09 relative to baseline range of 0.9-1.0, with most recent prior creatinine data point noted to be 0.96 on 01/28/2021, glucose 135.  CBC notable for white blood cell count 10,700, hemoglobin 11.6 relative to 12.8 on 01/28/2021.  Lactic acid 1.2.  INR 1.0.  Urinalysis notable for a cloudy specimen associated with many bacteria, nitrate positive, no squamous epithelial cells, and specific gravity of 1.025.  Nasopharyngeal COVID-19/influenza PCR performed in the ED today found to be negative.  Urine sample was sent for culture, and blood cultures x2 were collected prior to initiation of antibiotics.  EKG, in comparison to most recent prior from 07/05/2020 showed sinus tachycardia with heart rate 109, QTc 478 ms, nonspecific T wave inversion in V1/V2, which is unchanged from his recent prior EKG, and no evidence of ST changes, including no evidence of ST elevation.  Chest x-ray showed no evidence of acute cardiopulmonary process.  While in the ED, the following were administered: Rocephin 1 g IV x1, and a 30 mL/kg LR bolus equating to 1560 cc.   Subsequently, the patient was admitted to the med telemetry floor for further evaluation and management of presenting suspected acute metabolic encephalopathy in the context of severe sepsis due to suspected UTI.    Review of Systems: As per HPI otherwise 10 point review of systems negative.   Past Medical History:  Diagnosis Date  . Acute cystitis   . Acute respiratory failure ([place])   . Allergy    as a child grew out of them  . Anemia    pernicious anemia  . Anxiety   . Arthritis   . Back pain   . Blood transfusion   . CAP (community acquired pneumonia)   . CHF (congestive heart failure) ([place])   . Chronic pain syndrome   . Common bile duct stone   . Depression   . Diverticulosis of colon   . Duodenal ulcer hemorrhagic-after biliary sphincterotomy   . Dysthymia   . Esophagitis   . EtOH dependence ([place])   . Gastritis   . GERD (gastroesophageal reflux disease)   . H/O chest pain Dec. 2013   no work up done  . Hx of colonic polyps   . Hypertension   . Irritable bowel syndrome   . Lymphocytic colitis 02/16/2020  . Osteopenia   . Osteoporosis   . Pneumonia 2019  . Polypharmacy   .  Reflux esophagitis   . Right sided sciatica 12/22/2017  . Tobacco use disorder   . Unspecified chronic bronchitis ([place])   . Vertigo   . Vitamin B12 deficiency   . Wears glasses     Past Surgical History:  Procedure Laterality Date  . APPENDECTOMY    . BACK SURGERY  10-09   Dr. Patrice Paradise  . BIOPSY  02/14/2020   Procedure: BIOPSY;  Surgeon: Gatha Mayer, MD;  Location: [facility];  Service: Endoscopy;;  . CARPAL TUNNEL RELEASE Right 08/14/2014   Procedure: RIGHT CARPAL TUNNEL RELEASE AND INJECT LEFT THUMB;  Surgeon: Daryll Brod, MD;  Location: [place];  Service: Orthopedics;  Laterality: Right;  . CHOLECYSTECTOMY N/A 02/22/2020   Procedure: LAPAROSCOPIC CHOLECYSTECTOMY WITH INTRAOPERATIVE CHOLANGIOGRAM;  Surgeon: Kieth Brightly Arta Bruce, MD;  Location: [facility];  Service:  General;  Laterality: N/A;  . COLONOSCOPY    . COLONOSCOPY WITH PROPOFOL N/A 02/14/2020   Procedure: COLONOSCOPY WITH PROPOFOL;  Surgeon: Gatha Mayer, MD;  Location: [facility];  Service: Endoscopy;  Laterality: N/A;  . ENDOSCOPIC RETROGRADE CHOLANGIOPANCREATOGRAPHY (ERCP) WITH PROPOFOL N/A 12/25/2019   Procedure: ENDOSCOPIC RETROGRADE CHOLANGIOPANCREATOGRAPHY (ERCP) WITH PROPOFOL;  Surgeon: Gatha Mayer, MD;  Location: [place];  Service: Gastroenterology;  Laterality: N/A;  . ESOPHAGOGASTRODUODENOSCOPY (EGD) WITH PROPOFOL N/A 01/06/2020   Procedure: ESOPHAGOGASTRODUODENOSCOPY (EGD) WITH PROPOFOL;  Surgeon: Irene Shipper, MD;  Location: [facility] ENDOSCOPY;  Service: Endoscopy;  Laterality: N/A;  . HOT HEMOSTASIS N/A 01/06/2020   Procedure: HOT HEMOSTASIS (ARGON PLASMA COAGULATION/BICAP);  Surgeon: Irene Shipper, MD;  Location: [facility] ENDOSCOPY;  Service: Endoscopy;  Laterality: N/A;  . INTRAMEDULLARY (IM) NAIL INTERTROCHANTERIC Right 10/01/2018   Procedure: INTRAMEDULLARY (IM) NAIL INTERTROCHANTRIC;  Surgeon: Thornton Park, MD;  Location: [facility] ORS;  Service: Orthopedics;  Laterality: Right;  . LAPAROSCOPY N/A 02/21/2013   Procedure: LAPAROSCOPY OPERATIVE;  Surgeon: Margarette Asal, MD;  Location: [place] [facility];  Service: Gynecology;  Laterality: N/A;  REQUESTING 5MM SCOPE WITH CAMERA  . LEFT HEART CATH AND CORONARY ANGIOGRAPHY N/A 05/11/2019   Procedure: LEFT HEART CATH AND CORONARY ANGIOGRAPHY;  Surgeon: Sherren Mocha, MD;  Location: [place] CV LAB;  Service: Cardiovascular;  Laterality: N/A;  . REMOVAL OF STONES  12/25/2019   Procedure: Karlyn Agee;  Surgeon: Gatha Mayer, MD;  Location: [facility] ENDOSCOPY;  Service: Gastroenterology;;  . Clide Deutscher  01/06/2020   Procedure: Clide Deutscher;  Surgeon: Irene Shipper, MD;  Location: [facility] ENDOSCOPY;  Service: Endoscopy;;  . Joan Mayans  12/25/2019   Procedure: SPHINCTEROTOMY;  Surgeon: Gatha Mayer, MD;  Location: [place];  Service:  Gastroenterology;;  . TONSILLECTOMY    . TUBAL LIGATION    . UPPER GASTROINTESTINAL ENDOSCOPY      Social History:  reports that she has been smoking cigarettes. She has a 30.00 pack-year smoking history. She has never used smokeless tobacco. She reports current alcohol use of about 3.0 standard drinks of alcohol per week. She reports that she does not use drugs.   Allergies  Allergen Reactions  . Atorvastatin Nausea And Vomiting  . Levofloxacin Other (See Comments)    Reaction: achilles tendon pain  . Omnicef [Cefdinir] Diarrhea    Uncontrollable diarrhea  . Trazodone And Nefazodone Other (See Comments)    "Felt like I was going to faint"  . Sulfa Antibiotics Rash  . Sulfonamide Derivatives Rash    Family History  Problem Relation Age of Onset  . Colon cancer Father   . Prostate cancer Father   .  Heart disease Mother        prev MVR, also has DJD  . Colon cancer Brother   . Skin cancer Sister   . Alcohol abuse Daughter   . Bipolar disorder Daughter   . Drug abuse Daughter   . Esophageal cancer Neg Hx   . Rectal cancer Neg Hx   . Stomach cancer Neg Hx     Prior to Admission medications   Medication Sig Start Date End Date Taking? Authorizing Provider  aspirin EC 81 MG tablet Take 1 tablet (81 mg total) by mouth daily. Patient not taking: Reported on 01/31/2021 04/22/20   Josue Hector, MD  budesonide (ENTOCORT EC) 3 MG 24 hr capsule TAKE 3 CAPSULES ONCE DAILY. Patient taking differently: Take 9 mg by mouth daily. 05/15/20   Gatha Mayer, MD  buPROPion (WELLBUTRIN SR) 150 MG 12 hr tablet Take 1 tablet (150 mg total) by mouth daily. 10/21/20 04/19/21  Pucilowski, Marchia Bond, MD  Cholecalciferol (VITAMIN D) 50 MCG (2000 UT) tablet Take 2,000 Units by mouth daily after breakfast.  Patient not taking: Reported on 01/31/2021    [provider]  denosumab (PROLIA) 60 MG/ML SOSY injection Inject 60 mg into the skin every 6 (six) months.    [provider]   Diphenoxylate-Atropine (LOMOTIL PO) Lomotil  1 po qid prn Patient not taking: Reported on 01/31/2021    [provider]  DULoxetine (CYMBALTA) 30 MG capsule Take 1 capsule (30 mg total) by mouth daily. 01/31/21   Biagio Borg, MD  folic acid (FOLVITE) 1 MG tablet Take 1 tablet (1 mg total) by mouth daily. Patient taking differently: Take 1 mg by mouth daily after breakfast. 09/05/18   Biagio Borg, MD  gabapentin (NEURONTIN) 300 MG capsule Take 300 mg by mouth 3 (three) times daily.    [provider]  hyoscyamine (ANASPAZ) 0.125 MG TBDP disintergrating tablet Place 0.125 mg under the tongue every 6 (six) hours as needed ([name] cramps).     [provider]  morphine (MS CONTIN) 15 MG 12 hr tablet Take 15 mg by mouth 3 (three) times daily. 07/19/20   [provider]  Multiple Vitamin (MULTIVITAMIN WITH MINERALS) TABS tablet Take 1 tablet by mouth daily. 01/05/20   Swayze, Ava, DO  nicotine (NICODERM CQ - DOSED IN MG/24 HOURS) 21 mg/24hr patch Place 1 patch (21 mg total) onto the skin daily. 01/05/20   Swayze, Ava, DO  nitroGLYCERIN (NITROSTAT) 0.4 MG SL tablet Place 1 tablet (0.4 mg total) under the tongue every 5 (five) minutes as needed for chest pain. 04/22/20   Josue Hector, MD  ondansetron (ZOFRAN-ODT) 4 MG disintegrating tablet DISSOLVE 1 TABLET ON TONGUE EVERY 8 HOURS AS NEEDED FOR NAUSEA/VOMITING. 01/29/21   Gatha Mayer, MD  Oxycodone HCl 10 MG TABS Take 10 mg by mouth 4 (four) times daily.  03/06/19   [provider]  pantoprazole (PROTONIX) 40 MG tablet TAKE (1) TABLET TWICE A DAY BEFORE MEALS. 01/29/21   Gatha Mayer, MD  potassium chloride (MICRO-K) 10 MEQ CR capsule Take 1 capsule (10 mEq total) by mouth 2 (two) times daily. 08/23/20   Biagio Borg, MD  rosuvastatin (CRESTOR) 5 MG tablet Take 1 tablet (5 mg total) by mouth 3 (three) times a week. 08/28/20   Josue Hector, MD  sertraline (ZOLOFT) 100 MG tablet Take 2 tablets (200 mg total) by mouth  at bedtime. 11/15/20 05/14/21  Pucilowski, Marchia Bond, MD  spironolactone (ALDACTONE) 25 MG tablet Take 0.5 tablets (12.5 mg total) by mouth daily. Patient not taking: Reported on 01/31/2021 04/22/20   Josue Hector, MD  valACYclovir (VALTREX) 500 MG tablet Take 500 mg by mouth 2 (two) times daily as needed (flare ups).     [provider]  vitamin B-12 (CYANOCOBALAMIN) 100 MCG tablet Take 1 tablet (100 mcg total) by mouth daily. Patient not taking: Reported on 01/31/2021 11/03/18   Biagio Borg, MD     Objective    Physical Exam: Vitals:   02/09/21 1951 02/09/21 2030 02/09/21 2100 02/09/21 2200  BP:  115/73 111/73 112/74  Pulse:  94 90 85  Resp:  (!) 23 (!) 23 20  Temp: 98.3 F (36.8 C)     TempSrc: Oral     SpO2:  99% 99% 99%    General: appears to be stated age; oriented to person, place, time, and self (knows that she [facility] emergency department for evaluation of recent confusion; correctly identifies today's date as well as the current [place] president, in addition to correct identification of her birthdate).  Appears somewhat somnolent, intermittently falling asleep during our discussion. Skin: warm, dry, no rash Head:  AT/[place] Mouth:  Oral mucosa membranes appear moist, normal dentition Neck: supple; trachea midline Heart:  RRR; did not appreciate any M/R/G Lungs: CTAB, did not appreciate any wheezes, rales, or rhonchi Abdomen: + BS; soft, ND, NT Vascular: 2+ pedal pulses b/l; 2+ radial pulses b/l Extremities: no peripheral edema, no muscle wasting Neuro: Evaluation of full neurologic assessment limited by patient's somnolence, although she does appear to be spontaneously moving all 4 extremities; sensation appears to be intact in the upper and lower extremities bilaterally.  Unable to perform full assessment of cranial nerves in the setting of the above somnolence.   Labs on Admission: I have personally reviewed following labs and imaging  studies  CBC: Recent Labs  Lab 02/09/21 1636 02/09/21 1704  WBC 10.7*  --   NEUTROABS 8.4*  --   HGB 10.7* 11.6*  HCT 34.8* 34.0*  MCV 98.0  --   PLT 313  --    Basic Metabolic Panel: Recent Labs  Lab 02/09/21 1636 02/09/21 1704  NA 134* 134*  K 4.3 4.2  CL 100  --   CO2 22  --   GLUCOSE 135*  --   BUN 14  --   CREATININE 1.09*  --   CALCIUM 8.2*  --    GFR: CrCl cannot be calculated (Unknown ideal weight.). Liver Function Tests: Recent Labs  Lab 02/09/21 1636  AST 34  ALT 13  ALKPHOS 68  BILITOT 0.4  PROT 6.2*  ALBUMIN 3.2*   No results for input(s): LIPASE, AMYLASE in the last 168 hours. No results for input(s): AMMONIA in the last 168 hours. Coagulation Profile: Recent Labs  Lab 02/09/21 1636  INR 1.0   Cardiac Enzymes: No results for input(s): CKTOTAL, CKMB, CKMBINDEX, TROPONINI in the last 168 hours. BNP (last 3 results) No results for input(s): PROBNP in the last 8760 hours. HbA1C: No results for input(s): HGBA1C in the last 72 hours. CBG: No results for input(s): GLUCAP in the last 168 hours. Lipid Profile: No results for input(s): CHOL, HDL, LDLCALC, TRIG, CHOLHDL, LDLDIRECT in the last 72 hours. Thyroid Function Tests: No results for input(s): TSH, T4TOTAL, FREET4, T3FREE, THYROIDAB in the last 72 hours. Anemia Panel: No results for input(s): VITAMINB12, FOLATE, FERRITIN, TIBC, IRON, RETICCTPCT in the last 72  hours. Urine analysis:    Component Value Date/Time   COLORURINE YELLOW 02/09/2021 2124   APPEARANCEUR CLOUDY (A) 02/09/2021 2124   LABSPEC 1.025 02/09/2021 2124   PHURINE 6.0 02/09/2021 2124   GLUCOSEU NEGATIVE 02/09/2021 2124   GLUCOSEU NEGATIVE 12/22/2017 1650   HGBUR NEGATIVE 02/09/2021 2124   BILIRUBINUR NEGATIVE 02/09/2021 2124   BILIRUBINUR neg 02/07/2016 1627   KETONESUR NEGATIVE 02/09/2021 2124   PROTEINUR NEGATIVE 02/09/2021 2124   UROBILINOGEN 0.2 12/22/2017 1650   NITRITE POSITIVE (A) 02/09/2021 2124    LEUKOCYTESUR NEGATIVE 02/09/2021 2124    Radiological Exams on Admission: DG Chest Port 1 View  Result Date: 02/09/2021 CLINICAL DATA:  Lethargy yesterday. Syncopal episode today with some confusion. EXAM: PORTABLE CHEST 1 VIEW COMPARISON:  07/08/2020 FINDINGS: Cardiac silhouette is normal in size. No mediastinal or hilar masses. Prominent bronchovascular markings. Additional opacity noted the lung bases, greater on the right, consistent with atelectasis and/or scarring. No convincing pneumonia and no evidence of pulmonary edema. No pleural effusion or pneumothorax. Skeletal structures demineralized, but grossly intact. IMPRESSION: 1. No acute cardiopulmonary disease. Electronically Signed   By: Lajean Manes M.D.   On: 02/09/2021 16:46     EKG: Independently reviewed, with result as described above.    Assessment/Plan   MARSHA GUNDLACH is a 70 y.o. female with medical history significant for chronic hypoxic respiratory failure on 2 L continuous supplemental oxygen, COPD, chronic pain syndrome on chronic opioid therapy , chronic diastolic heart failure, depression, who is admitted to [facility] on 02/09/2021 with suspected acute metabolic encephalopathy after presenting from home to [facility] ED for evaluation of altered mental status.    Principal Problem:   Acute metabolic encephalopathy Active Problems:   GERD   Depression   Tobacco abuse   Severe sepsis (HCC)   Acute cystitis   Left hip pain    #) Acute metabolic encephalopathy: 2 days of reported confusion and somnolence, which is potentially multifactorial in nature, with suspected contribution from physiologic stress stemming from severe sepsis in the setting of suspected urinary tract infection, as further described below.  There may also be a pharmacologic contribution from the patient's use of chronic opioid use.  Sitter the possibility of contribution from hypercapnic encephalopathy in the setting of COPD and opioid use,  although presenting VBG does not appear to be consistent with this.  Regardless, differential includes polypharmacy, including recent resumption of Cymbalta superimposed on existing Wellbutrin and Zoloft, as further described above.  Per my discussions with inpatient pharmacy this evening, the recent resumption of Cymbalta 8 days ago does not warrant any tapering, but rather can be safely discontinued at this time.  Of note, COVID-19/influenza PCR performed in the ED this evening found to be negative, while presenting chest x-ray shows no evidence of acute cardiopulmonary process.  Following initiation of IV antibiotics for suspected urinary tract infection, the patient's confusion appears to be improving, and I found her to be oriented to person, place, time, and self, although she does remain somnolent, intermittently falling asleep during our discussions.  No evidence of acute focal neurologic deficits to suspect acute stroke.  Should patient's somnolence and continue to improve with treatment of underlying UTI as well as holding of home opioid medications, could consider pursuing CT of the head in the context of recent fall, although this appears to been a ground-level mechanical fall, without associated loss of consciousness in the setting of no evidence of acute focal neurologic deficits.   Plan: Recommend  management presenting severe sepsis due to suspected urinary tract infection, as above.  We will keep in p.o. until the patient has passed nursing bedside swallow evaluation.  Check urinary drug screen as well as serum ethanol level.  Check TSH.  Repeat CMP and CBC in the morning. Will hold home opioid pain medications for now, with plan to resume scheduled MS Contin as somnolence improves, with plan to order as needed oxycodone for breakthrough discomfort as opposed to continuing on scheduled oxycodone.  Hold gabapentin for now.  Hold home Cymbalta.       #) Severe sepsis due to urinary tract  infection: Diagnosis on the basis of 2 days of dysuria, with urinalysis showing cloudy specimen associated many bacteria that was nitrate positive.  UA was associated with significant pyuria, although in the context of acute urinary symptoms, objective fever per EMS, and criteria for severe sepsis met, will treat as urinary tract infection with initial IV antibiotics, as further described below.  Overall, SIRS criteria met the presenting objective fever, tachycardia.  Additionally, patient sepsis meets criteria to severe nature in the setting of presenting acute encephalopathy.  Of note, presenting lactic acid found to be nonelevated at 1.2.  No evidence of associated hypotension thus far.  Urine specimen was sent for culture and blood cultures x2 were collected prior to initiation of Rocephin in the ED.  Additionally, the patient has received a 30 mL/kg LR bolus in the ED this evening, as further noted above.  No evidence of additional underlying infectious process at this time, including negative COVID-19/influenza PCR performed in the ED this evening.  Additionally, chest x-ray shows no evidence of acute cardiopulmonary process, including no evidence of infiltrate.  Plan: Continue Rocephin.  Monitor for results blood cultures x2 as well as urine culture collected in the ED this evening.  Repeat CBC with differential in the morning.  As needed acetaminophen for fever.      #) Left hip pain: Patient reports worsening of her left hip pain over the last several weeks following her reported ground-level mechanical fall on 02/08/2021.  Left lower extremity appears neurovascular intact, although full neurologic assessment of the left lower remedy is limited by the patient's current level of pain control.  We will pursue plain films of the left hip, and approach associated pain control conservatively in the context of presenting suspected acute metabolic encephalopathy.   Plan: Plain films of the left hip, as  above.  As somnolence improves, will plan to resume MS Contin, with prn Oxycodone as opposed to her reported home scheduled oxycodone.  As needed Narcan ordered.      #) Chronic hypoxic respiratory failure: in the setting of documented history COPD, the patient reports that she is on 2 L continuous supplemental oxygen at baseline.  It appears that patient supplemental oxygen was empirically increased to 4 L nasal cannula upon arrival in the emergency department in spite of no evidence of acute hypoxic findings.  On this ensuing rate of 4 L/min nasal cannula, patient's oxygen saturations have been noted to be in the high 90s, and can likely be weaned down back to her baseline 2 L/min given the absence of evidence of acute COPD exacerbation at this time, including reassuring VBG findings, as above.  Plan: Monitor continuous pulse oximetry.  Add on serum magnesium level.  Check serum phosphorus level.  As needed albuterol nebulizer.      #) GERD: On Protonix twice daily as an outpatient.  Plan: In the  setting of current n.p.o. status, pending nursing bedside swallow screen, I have ordered IV Protonix twice daily.       #) Depression: Per my discussion with the inpatient pharmacist this evening, the patient has been taking Wellbutrin and Zoloft for at least the last 6 months, with resumption of Cymbalta by her PCP approximately 8 days ago after being off of this medication for at least the last 6 months.  Per these discussions with the inpatient pharmacist, initiation of Cymbalta 8 days ago does not warrant any weaning of this medication, but rather it appears safe to discontinue further use of Cymbalta at this time.  In the setting of presenting suspected acute encephalopathy, will hold Cymbalta for now and attempt to provide some limited to central acting medications, but will plan to resume Wellbutrin and Zoloft as it appears that the patient has been on both of these medications at current  dose for at least the last 6 months.  Plan: Resume home Zoloft and Wellbutrin, while holding recently resume Cymbalta, as further described above.      #) Chronic diastolic heart failure: Most recent echocardiogram, which was performed in July 2021, showed LVEF 60 to 65%, no evidence of focal wall motion normalities, grade 1 diastolic dysfunction, no significant valvular pathology, including no evidence of aortic stenosis.  No clinical or radiographic evidence to suggest acutely decompensated heart failure at this time.  Plan: Monitor strict I's and O's and daily weights.  Repeat BMP in the morning.  Add on serum magnesium level.      #) Chronic tobacco abuse: Patient is a current smoker, having smoked 1 pack/day over the last 30 years.  Plan: Counseled the patient portance of complete discontinuation of smoking, particular in the setting of a history of chronic hypoxic respiratory failure, as above.      DVT prophylaxis: scd's  Code Status: Full code Disposition Plan: Per Rounding Team Consults called: none  Admission status: Inpatient; med telemetry   Of note, this patient was added by me to the following Admit List/Treatment Team:  mcadmits.     PLEASE NOTE THAT DRAGON DICTATION SOFTWARE WAS USED IN THE CONSTRUCTION OF THIS NOTE.   Rhetta Mura DO [facility] Pager [phone] From [place]   02/09/2021, 11:26 PM

## 2021-02-10 ENCOUNTER — Inpatient Hospital Stay (HOSPITAL_COMMUNITY): Payer: Medicare HMO

## 2021-02-10 DIAGNOSIS — F331 Major depressive disorder, recurrent, moderate: Secondary | ICD-10-CM

## 2021-02-10 DIAGNOSIS — N390 Urinary tract infection, site not specified: Secondary | ICD-10-CM

## 2021-02-10 DIAGNOSIS — M25552 Pain in left hip: Secondary | ICD-10-CM | POA: Diagnosis present

## 2021-02-10 DIAGNOSIS — G9341 Metabolic encephalopathy: Secondary | ICD-10-CM | POA: Diagnosis not present

## 2021-02-10 DIAGNOSIS — R6520 Severe sepsis without septic shock: Secondary | ICD-10-CM | POA: Diagnosis present

## 2021-02-10 DIAGNOSIS — K219 Gastro-esophageal reflux disease without esophagitis: Secondary | ICD-10-CM | POA: Diagnosis not present

## 2021-02-10 DIAGNOSIS — A419 Sepsis, unspecified organism: Secondary | ICD-10-CM | POA: Diagnosis present

## 2021-02-10 DIAGNOSIS — N3000 Acute cystitis without hematuria: Secondary | ICD-10-CM | POA: Diagnosis present

## 2021-02-10 LAB — BLOOD CULTURE ID PANEL (REFLEXED) - BCID2

## 2021-02-10 LAB — CBC WITH DIFFERENTIAL/PLATELET
Abs Immature Granulocytes: 0.04 10*3/uL (ref 0.00–0.07)
Basophils Absolute: 0 10*3/uL (ref 0.0–0.1)
Basophils Relative: 0 %
Eosinophils Absolute: 0 10*3/uL (ref 0.0–0.5)
Eosinophils Relative: 0 %
HCT: 33.6 % — ABNORMAL LOW (ref 36.0–46.0)
Hemoglobin: 10.5 g/dL — ABNORMAL LOW (ref 12.0–15.0)
Immature Granulocytes: 0 %
Lymphocytes Relative: 17 %
Lymphs Abs: 2 10*3/uL (ref 0.7–4.0)
MCH: 30.7 pg (ref 26.0–34.0)
MCHC: 31.3 g/dL (ref 30.0–36.0)
MCV: 98.2 fL (ref 80.0–100.0)
Monocytes Absolute: 1.1 10*3/uL — ABNORMAL HIGH (ref 0.1–1.0)
Monocytes Relative: 9 %
Neutro Abs: 8.7 10*3/uL — ABNORMAL HIGH (ref 1.7–7.7)
Neutrophils Relative %: 74 %
Platelets: 292 10*3/uL (ref 150–400)
RBC: 3.42 MIL/uL — ABNORMAL LOW (ref 3.87–5.11)
RDW: 12.1 % (ref 11.5–15.5)
WBC: 11.9 10*3/uL — ABNORMAL HIGH (ref 4.0–10.5)
nRBC: 0 % (ref 0.0–0.2)

## 2021-02-10 LAB — MAGNESIUM
Magnesium: 2.1 mg/dL (ref 1.7–2.4)
Magnesium: 1.9 mg/dL (ref 1.7–2.4)

## 2021-02-10 LAB — TROPONIN I (HIGH SENSITIVITY)
Troponin I (High Sensitivity): 136 ng/L (ref <18)
Troponin I (High Sensitivity): 101 ng/L (ref <18)

## 2021-02-10 LAB — COMPREHENSIVE METABOLIC PANEL
ALT: 13 U/L (ref 0–44)
AST: 23 U/L (ref 15–41)
Albumin: 2.8 g/dL — ABNORMAL LOW (ref 3.5–5.0)
Alkaline Phosphatase: 64 U/L (ref 38–126)
Anion gap: 9 (ref 5–15)
BUN: 10 mg/dL (ref 8–23)
CO2: 27 mmol/L (ref 22–32)
Calcium: 7.9 mg/dL — ABNORMAL LOW (ref 8.9–10.3)
Chloride: 100 mmol/L (ref 98–111)
Creatinine, Ser: 0.74 mg/dL (ref 0.44–1.00)
GFR, Estimated: >60 mL/min (ref >60)
Glucose, Bld: 114 mg/dL — ABNORMAL HIGH (ref 70–99)
Potassium: 4.4 mmol/L (ref 3.5–5.1)
Sodium: 136 mmol/L (ref 135–145)
Total Bilirubin: 0.7 mg/dL (ref 0.3–1.2)
Total Protein: 5.5 g/dL — ABNORMAL LOW (ref 6.5–8.1)

## 2021-02-10 LAB — TSH: TSH: 0.175 u[IU]/mL — ABNORMAL LOW (ref 0.350–4.500)

## 2021-02-10 LAB — RAPID URINE DRUG SCREEN, HOSP PERFORMED
Amphetamines: NOT DETECTED
Barbiturates: NOT DETECTED
Benzodiazepines: NOT DETECTED
Cocaine: NOT DETECTED
Opiates: POSITIVE — AB
Tetrahydrocannabinol: NOT DETECTED

## 2021-02-10 LAB — ETHANOL: Alcohol, Ethyl (B): <10 mg/dL (ref <10)

## 2021-02-10 LAB — HIV ANTIBODY (ROUTINE TESTING W REFLEX): HIV Screen 4th Generation wRfx: NONREACTIVE

## 2021-02-10 MED ORDER — OXYCODONE HCL 5 MG PO TABS
10.0000 mg | ORAL_TABLET | Freq: Four times a day (QID) | ORAL | Status: DC | PRN
  Administered 2021-02-10 – 2021-02-21 (×37): 10 mg via ORAL
  Filled 2021-02-10 (×37): qty 2

## 2021-02-10 MED ORDER — SERTRALINE HCL 100 MG PO TABS
200.0000 mg | ORAL_TABLET | Freq: Every day | ORAL | Status: DC
  Administered 2021-02-11 – 2021-02-21 (×12): 200 mg via ORAL
  Filled 2021-02-10 (×14): qty 2

## 2021-02-10 MED ORDER — MELATONIN 3 MG PO TABS
3.0000 mg | ORAL_TABLET | Freq: Every evening | ORAL | Status: DC | PRN
  Administered 2021-02-11 – 2021-02-21 (×9): 3 mg via ORAL
  Filled 2021-02-10 (×9): qty 1

## 2021-02-10 MED ORDER — MORPHINE SULFATE ER 15 MG PO TBCR
15.0000 mg | EXTENDED_RELEASE_TABLET | Freq: Three times a day (TID) | ORAL | Status: DC
  Administered 2021-02-10 – 2021-02-21 (×36): 15 mg via ORAL
  Filled 2021-02-10 (×38): qty 1

## 2021-02-10 NOTE — ED Notes (Signed)
Attempted report  X1. Left name and number with unit secretary.

## 2021-02-10 NOTE — ED Notes (Signed)
Attempted x 2.

## 2021-02-10 NOTE — Progress Notes (Signed)
PROGRESS NOTE    Denise King  ONG:[id number] DOB: [birth year] DOA: 02/09/2021 PCP: Biagio Borg, MD   Brief Narrative:  HPI per Dr. Babs Bertin on 02/09/21 Denise King is a 69 y.o. female with medical history significant for chronic hypoxic respiratory failure on 2 L continuous supplemental oxygen, COPD, chronic pain syndrome on chronic opioid therapy , chronic diastolic heart failure, depression, who is admitted to [facility] on 02/09/2021 with suspected acute metabolic encephalopathy after presenting from home to [facility] ED for evaluation of altered mental status.   The following history is provided by the patient, her husband, EMS, my discussions with the emergency department physician, and via chart review.  Patient's husband, with whom the patient lives at home, noted the patient to exhibit 1 to 2 days of confusion associated with lethargy and increased somnolence, noting the patient to fall asleep during discussions over that time, which represents a departure from her baseline level of alertness.    The patient reports ground-level fall around 2300 on 02/08/21 in which the trip on the corner of the door frame while attempting to walk back into her house after taking her dogs outside.  She reports that she fell on her left side, landing on her left hip, resulting in worsening of chronic left hip pain.  Correspondingly, she reports worsening of the her sharp left hip pain, with no radiation.  She notes exacerbation of the intensity of this discomfort with ambulation as well as with attempting to bear weight on the left lower extremity, although she acknowledges that she has been able to continue to ambulate over that timeframe in spite of this discomfort.  She reports that she hit her head as a component of this fall, but does not believe that her head was the primary point of contact as a consequence of the above fall.  She denies any ensuing headache, change in vision,  dizziness.  She reports no associated loss of consciousness.  She also denies preceding or ensuing chest pain, palpitations, diaphoresis, presyncope, or syncope. The patient and her husband convey that the patient has exhibited diminished oral intake over the last day, with the patient exhibiting one episode of nonbilious, nonbloody emesis yesterday.  She currently denies any underlying nausea.  She notes 1 to 2 days of new onset dysuria with subjective fever in the absence of chills, full body rigors, or generalized myalgias.  Denies any associated gross hematuria or change in urinary urgency/frequency.  Denies any associated neck stiffness, rhinitis, rhinorrhea, sore throat, shortness of breath, wheezing, cough, nausea, vomiting, abdominal pain, diarrhea, or rash.  No recent traveling or known COVID-19 exposures.  She reports that she has received 2 doses of Pfizer's COVID-19 vaccine in March and April 2021, as well as ensuing booster in November 2021.  Medical history notable for history of chronic pain syndrome on chronic opioid therapy with reported scheduled MS Contin 15 mg p.o. 3 times daily as well as scheduled oxycodone 10 mg p.o. every 6 hours.  This is in addition to use of gabapentin.  Additionally, from my discussions with the inpatient pharmacist, it appears that the patient has been taking Wellbutrin and Zoloft over the last 6 months, with preceding Cymbalta discontinued at least 6 months ago per review of associated psychiatry documentation.  However, approximately 8 days ago, in the setting of PCP follow-up, there was concern for worsening depression prompting PCP to resume the patient's Cymbalta while continuing existing Wellbutrin and Zoloft.   In  the context of he has been his concern for the patient somnolence over the last 1 to 2 days, EMS was contacted, and upon arrival reportedly noted the patient's temperature to be 103.  Consequently, she received 1 g of acetaminophen in route to  [facility] emergency department.   Of note, while EMS documentation conveys a potential syncopal episode over the last day, the patient vehemently denies any recent loss of consciousness.     ED Course:  Vital signs in the ED were notable for the following: Temperature max 100.0; initial heart rate 111, which decreased to 90 following interval administration of IV fluids, as further described above; blood pressure 105/70 - 123/82; respiratory rate 15-23; oxygen saturation 97 to 99% on 4 L [place].  Labs were notable for the following: ABG performed on 4 L nasal cannula showed the following: 7.352/49.9.  CMP was notable for the following: Sodium 134, bicarbonate 22, BUN 14, creatinine 1.09 relative to baseline range of 0.9-1.0, with most recent prior creatinine data point noted to be 0.96 on 01/28/2021, glucose 135.  CBC notable for white blood cell count 10,700, hemoglobin 11.6 relative to 12.8 on 01/28/2021.  Lactic acid 1.2.  INR 1.0.  Urinalysis notable for a cloudy specimen associated with many bacteria, nitrate positive, no squamous epithelial cells, and specific gravity of 1.025.  Nasopharyngeal COVID-19/influenza PCR performed in the ED today found to be negative.  Urine sample was sent for culture, and blood cultures x2 were collected prior to initiation of antibiotics.  EKG, in comparison to most recent prior from 07/05/2020 showed sinus tachycardia with heart rate 109, QTc 478 ms, nonspecific T wave inversion in V1/V2, which is unchanged from his recent prior EKG, and no evidence of ST changes, including no evidence of ST elevation.  Chest x-ray showed no evidence of acute cardiopulmonary process.  While in the ED, the following were administered: Rocephin 1 g IV x1, and a 30 mL/kg LR bolus equating to 1560 cc.  Subsequently, the patient was admitted to the med telemetry floor for further evaluation and management of presenting suspected acute metabolic encephalopathy in the context of severe sepsis  due to suspected UTI.  **Interim History  Her mentation is improved but nurses complain of significant back pain and hip pain.  Renal function is improving as well.  Will need PT OT evaluate and treat.  Assessment & Plan:   Principal Problem:   Acute metabolic encephalopathy Active Problems:   GERD   Depression   Tobacco abuse   Severe sepsis (HCC)   Acute cystitis   Left hip pain  Acute Metabolic Encephalopathy -2 days of reported confusion and somnolence, which is potentially multifactorial in nature, with suspected contribution from physiologic stress stemming from severe sepsis in the setting of suspected urinary tract infection, as further described below.   -There may also be a pharmacologic contribution from the patient's use of chronic opioid use.  Sitter the possibility of contribution from hypercapnic encephalopathy in the setting of COPD and opioid use, although presenting VBG does not appear to be consistent with this.   -Regardless, differential includes polypharmacy, including recent resumption of Cymbalta superimposed on existing Wellbutrin and Zoloft, as further described above.   -Per my discussions with inpatient pharmacy this evening, the recent resumption of Cymbalta 8 days ago does not warrant any tapering, but rather can be safely discontinued at this time.   -Of note, COVID-19/influenza PCR performed in the ED this evening found to be negative, while presenting chest x-ray  shows no evidence of acute cardiopulmonary process. -Following initiation of IV antibiotics for suspected urinary tract infection, the patient's confusion appears to be improving, and I found her to be oriented to person, place, time, and self, although she does remain somnolent, intermittently falling asleep during our discussions.   -No evidence of acute focal neurologic deficits to suspect acute stroke.   -Should patient's somnolence and continue to improve with treatment of underlying UTI as well  as holding of home opioid medications, could consider pursuing CT of the head in the context of recent fall, although this appears to been a ground-level mechanical fall, without associated loss of consciousness in the setting of no evidence of acute focal neurologic deficits. -Will management presenting severe sepsis due to suspected urinary tract infection, as above.   -We will keep in p.o. until the patient has passed nursing bedside swallow evaluation.   -Check urinary drug screen as well as serum ethanol level.  Check TSH.  Repeat CMP and CBC in the morning.  -Will hold home opioid pain medications for now, with plan to resume scheduled MS Contin as somnolence improves, with plan to order as needed oxycodone for breakthrough discomfort as opposed to continuing on scheduled oxycodone.   -Hold gabapentin for now.  -Hold home Cymbalta.  -Delirium Precautions  Severe Sepsis due to Urinary Tract Infection -Diagnosis on the basis of 2 days of dysuria, with urinalysis showing cloudy specimen associated many bacteria that was nitrate positive.   -WBC went from 10.7 -> 11.9 -UA was associated with significant pyuria, although in the context of acute urinary symptoms, objective fever per EMS, and criteria for severe sepsis met, will treat as urinary tract infection with initial IV antibiotics, as further described below.   -Overall, SIRS criteria met the presenting objective fever, tachycardia.  Additionally, patient sepsis meets criteria to severe nature in the setting of presenting acute encephalopathy.  Of note, presenting lactic acid found to be nonelevated at 1.2.  No evidence of associated hypotension thus far.   -Urine specimen was sent for culture and blood cultures x2 were collected prior to initiation of Rocephin in the ED.   -Additionally, the patient has received a 30 mL/kg LR bolus in the ED this evening, as further noted above.  No evidence of additional underlying infectious process at  this time, including negative COVID-19/influenza PCR performed in the ED this evening.   -Additionally, chest x-ray shows no evidence of acute cardiopulmonary process, including no evidence of infiltrate. -Blood cultures x2 showed possible contaminant; follow-up on final cultures -Continue Rocephin.  Monitor for results blood cultures x2 as well as urine culture collected in the ED this evening.   -Repeat CBC with differential in the morning.  As needed acetaminophen for fever.  Left hip pain and back pain -Patient reports worsening of her left hip pain over the last several weeks following her reported ground-level mechanical fall on 02/08/2021.   -Left lower extremity appears neurovascular intact, although full neurologic assessment of the left lower remedy is limited by the patient's current level of pain control.  We will pursue plain films of the left hip, and approach associated pain control conservatively in the context of presenting suspected acute metabolic encephalopathy.  -Left hip x-ray showed "No acute displaced fracture or dislocation. Extensive vascular calcifications." -As somnolence improves, will plan to resume MS Contin, with prn Oxycodone as opposed to her reported home scheduled oxycodone.  As needed Narcan ordered.  Chronic hypoxic respiratory failure -In the setting of  documented history COPD, the patient reports that she is on 2 L continuous supplemental oxygen at baseline.   -It appears that patient supplemental oxygen was empirically increased to 4 L nasal cannula upon arrival in the emergency department in spite of no evidence of acute hypoxic findings.   -On this ensuing rate of 4 L/min nasal cannula, patient's oxygen saturations have been noted to be in the high 90s, and can likely be weaned down back to her baseline 2 L/min given the absence of evidence of acute COPD exacerbation at this time, including reassuring VBG findings, as above. -Continue supplemental oxygen  via nasal cannula and wean O2 as tolerated to baseline Continuous pulse oximetry maintain O2 saturation greater than 90% -Continue with albuterol neb as needed -Repeat chest x-ray in a.m.  AKI -Patient's WBC went from 14/1.09 -> 10/0.74 -Avoid Nephrotoxic Medications, Contrast Dyes, Hypotension and Renally adjust medications -Received IVF Hydration for Sepsis protocol -Continue to Monitor and Trend Renal Fxn -Repeat CMP in the AM   GERD -On Protonix twice daily as an outpatient. -Placed on IV PPI twice daily given her n.p.o. status and SLP evaluation  Depression -Per Dr. Brett Albino discussion with the inpatient pharmacist yesterday evening, the patient has been taking Wellbutrin and Zoloft for at least the last 6 months, with resumption of Cymbalta by her PCP approximately 8 days ago after being off of this medication for at least the last 6 months.   -Per these discussions with the inpatient pharmacist, initiation of Cymbalta 8 days ago does not warrant any weaning of this medication, but rather it appears safe to discontinue further use of Cymbalta at this time.  -In the setting of presenting suspected acute encephalopathy, will hold Cymbalta for now and attempt to provide some limited to central acting medications, but will plan to resume Wellbutrin and Zoloft as it appears that the patient has been on both of these medications at current dose for at least the last 6 months. -Resume homed Zoloft and Wellbutrin, while holding recently resume Cymbalta, as further described above. -Continue to Monitor carefully  Normocytic Anemia  -Patient's Hgb/Hct went from 11.6/34.0 -> 10.5/33.6  -Check Anemia Panel in the AM -Continue to Monitor for S/Sx of Bleeding; Currently no overt bleeding noted -Repeat CBC in the AM   Chronic Diastolic Heart Failure -Most recent echocardiogram, which was performed in July 2021, showed LVEF 60 to 65%, no evidence of focal wall motion normalities, grade 1  diastolic dysfunction, no significant valvular pathology, including no evidence of aortic stenosis.  No clinical or radiographic evidence to suggest acutely decompensated heart failure at this time. -Monitor strict I's and O's and daily weights.   -Continue to Monitor for S/Sx of Volume Overload   Chronic Tobacco Abuse -Patient is a current smoker, having smoked 1 pack/day over the last 30 years. -Counseled the patient for the Importance of complete discontinuation of smoking, particular in the setting of a history of chronic hypoxic respiratory failure, as above.  DVT prophylaxis: SCDs Code Status: FULL CODE  Family Communication: Discussed with Daughter ate bedside  Disposition Plan: Pending further evaluation by PT and OT Status is: Inpatient  Remains inpatient appropriate because:Unsafe d/c plan, IV treatments appropriate due to intensity of illness or inability to take PO and Inpatient level of care appropriate due to severity of illness   Dispo: The patient is from: Home              Anticipated d/c is to: Home  Anticipated d/c date is: 2 days              Patient currently is not medically stable to d/c.   Difficult to place patient No  Consultants:   None   Procedures: None  Antimicrobials:  Anti-infectives (From admission, onward)   Start     Dose/Rate Route Frequency Ordered Stop   02/10/21 2000  cefTRIAXone (ROCEPHIN) 1 g in sodium chloride 0.9 % 100 mL IVPB        1 g 200 mL/hr over 30 Minutes Intravenous Every 24 hours 02/09/21 2324     02/09/21 1830  cefTRIAXone (ROCEPHIN) 1 g in sodium chloride 0.9 % 100 mL IVPB        1 g 200 mL/hr over 30 Minutes Intravenous  Once 02/09/21 1817 02/09/21 2017        Subjective: Seen and examined at bedside and her mentation is much improved but she is complaining of significant back pain and hip pain.  No nausea or vomiting.  Felt okay.  No other concerns or complaints at this time.  Objective: Vitals:    02/10/21 1200 02/10/21 1300 02/10/21 1315 02/10/21 1500  BP: (!) 101/58 111/67  95/83  Pulse: 87 (!) 105 99 94  Resp: (!) 22 (!) 23 (!) 24 (!) 24  Temp:      TempSrc:      SpO2: 94%  98% 97%    Intake/Output Summary (Last 24 hours) at 02/10/2021 1739 Last data filed at 02/10/2021 1611 Gross per 24 hour  Intake --  Output 800 ml  Net -800 ml   There were no vitals filed for this visit.  Examination: Physical Exam:  Constitutional: Thin chronically ill-appearing Caucasian female currently in no acute distress but appears calm and uncomfortable due to pain Eyes: Lids and conjunctivae normal, sclerae anicteric  ENMT: External Ears, Nose appear normal. Grossly normal hearing.  Neck: Appears normal, supple, no cervical masses, normal ROM, no appreciable thyromegaly; no appreciable JVD Respiratory: Diminished to auscultation bilaterally with coarse breath sounds, no wheezing, rales, rhonchi or crackles. Normal respiratory effort and patient is not tachypenic. No accessory muscle use.  Unlabored breathing Cardiovascular: RRR, no murmurs / rubs / gallops. S1 and S2 auscultated.  Slight extremity edema Abdomen: Soft, non-tender, non-distended.  Bowel sounds positive.  GU: Deferred. Musculoskeletal: No clubbing / cyanosis of digits/nails. No joint deformity upper and lower extremities..  Skin: No rashes, lesions, ulcers on to skin evaluation. No induration; Warm and dry.  Neurologic: CN 2-12 grossly intact with no focal deficits. Romberg sign and cerebellar reflexes not assessed.  Psychiatric: Normal judgment and insight. Alert and oriented x 3. Normal mood and appropriate affect.   Data Reviewed: I have personally reviewed following labs and imaging studies  CBC: Recent Labs  Lab 02/09/21 1636 02/09/21 1704 02/10/21 0405  WBC 10.7*  --  11.9*  NEUTROABS 8.4*  --  8.7*  HGB 10.7* 11.6* 10.5*  HCT 34.8* 34.0* 33.6*  MCV 98.0  --  98.2  PLT 313  --  161   Basic Metabolic  Panel: Recent Labs  Lab 02/09/21 1636 02/09/21 1704 02/09/21 2325 02/10/21 0405  NA 134* 134*  --  136  K 4.3 4.2  --  4.4  CL 100  --   --  100  CO2 22  --   --  27  GLUCOSE 135*  --   --  114*  BUN 14  --   --  10  CREATININE 1.09*  --   --  0.74  CALCIUM 8.2*  --   --  7.9*  MG  --   --  2.1 1.9   GFR: CrCl cannot be calculated (Unknown ideal weight.). Liver Function Tests: Recent Labs  Lab 02/09/21 1636 02/10/21 0405  AST 34 23  ALT 13 13  ALKPHOS 68 64  BILITOT 0.4 0.7  PROT 6.2* 5.5*  ALBUMIN 3.2* 2.8*   No results for input(s): LIPASE, AMYLASE in the last 168 hours. No results for input(s): AMMONIA in the last 168 hours. Coagulation Profile: Recent Labs  Lab 02/09/21 1636  INR 1.0   Cardiac Enzymes: No results for input(s): CKTOTAL, CKMB, CKMBINDEX, TROPONINI in the last 168 hours. BNP (last 3 results) No results for input(s): PROBNP in the last 8760 hours. HbA1C: No results for input(s): HGBA1C in the last 72 hours. CBG: No results for input(s): GLUCAP in the last 168 hours. Lipid Profile: No results for input(s): CHOL, HDL, LDLCALC, TRIG, CHOLHDL, LDLDIRECT in the last 72 hours. Thyroid Function Tests: Recent Labs    02/10/21 0406  TSH 0.175*   Anemia Panel: No results for input(s): VITAMINB12, FOLATE, FERRITIN, TIBC, IRON, RETICCTPCT in the last 72 hours. Sepsis Labs: Recent Labs  Lab 02/09/21 1636  LATICACIDVEN 1.2    Recent Results (from the past 240 hour(s))  Blood Culture (routine x 2)     Status: None (Preliminary result)   Collection Time: 02/09/21  4:36 PM   Specimen: BLOOD LEFT FOREARM  Result Value Ref Range Status   Specimen Description BLOOD LEFT FOREARM  Final   Special Requests   Final    BOTTLES DRAWN AEROBIC AND ANAEROBIC Blood Culture adequate volume   Culture  Setup Time   Final    GRAM POSITIVE COCCI IN CLUSTERS IN BOTH AEROBIC AND ANAEROBIC BOTTLES Organism ID to follow CRITICAL RESULT CALLED TO, READ BACK BY AND  VERIFIED WITH: A PAYTES PHARMD [id number] 02/10/21 A BROWNING Performed at [facility], [address] [place], [place] [address]    Culture PENDING  Incomplete   Report Status PENDING  Incomplete  Resp Panel by RT-PCR (Flu A&B, Covid) Nasopharyngeal Swab     Status: None   Collection Time: 02/09/21  4:36 PM   Specimen: Nasopharyngeal Swab; Nasopharyngeal(NP) swabs in vial transport medium  Result Value Ref Range Status   SARS Coronavirus 2 by RT PCR NEGATIVE NEGATIVE Final    Comment: (NOTE) SARS-CoV-2 target nucleic acids are NOT DETECTED.  The SARS-CoV-2 RNA is generally detectable in upper respiratory specimens during the acute phase of infection. The lowest concentration of SARS-CoV-2 viral copies this assay can detect is 138 copies/mL. A negative result does not preclude SARS-Cov-2 infection and should not be used as the sole basis for treatment or other patient management decisions. A negative result may occur with  improper specimen collection/handling, submission of specimen other than nasopharyngeal swab, presence of viral mutation(s) within the areas targeted by this assay, and inadequate number of viral copies(<138 copies/mL). A negative result must be combined with clinical observations, patient history, and epidemiological information. The expected result is Negative.  Fact Sheet for Patients:  BloggerCourse.com  Fact Sheet for Healthcare Providers:  SeriousBroker.it  This test is no t yet approved or cleared by the [place] FDA and  has been authorized for detection and/or diagnosis of SARS-CoV-2 by FDA under an Emergency Use Authorization (EUA). This EUA will remain  in effect (meaning this test can be used) for the duration of the COVID-19 declaration under  Section 564(b)(1) of the Act, 21 U.S.C.section 360bbb-3(b)(1), unless the authorization is terminated  or revoked sooner.       Influenza A by PCR  NEGATIVE NEGATIVE Final   Influenza B by PCR NEGATIVE NEGATIVE Final    Comment: (NOTE) The Xpert Xpress SARS-CoV-2/FLU/RSV plus assay is intended as an aid in the diagnosis of influenza from Nasopharyngeal swab specimens and should not be used as a sole basis for treatment. Nasal washings and aspirates are unacceptable for Xpert Xpress SARS-CoV-2/FLU/RSV testing.  Fact Sheet for Patients: EntrepreneurPulse.com.au  Fact Sheet for Healthcare Providers: IncredibleEmployment.be  This test is not yet approved or cleared by the [place] FDA and has been authorized for detection and/or diagnosis of SARS-CoV-2 by FDA under an Emergency Use Authorization (EUA). This EUA will remain in effect (meaning this test can be used) for the duration of the COVID-19 declaration under Section 564(b)(1) of the Act, 21 U.S.C. section 360bbb-3(b)(1), unless the authorization is terminated or revoked.  Performed at [facility], [place] [address] [place], [place] [address]   Blood Culture ID Panel (Reflexed)     Status: Abnormal   Collection Time: 02/09/21  4:36 PM  Result Value Ref Range Status   Enterococcus faecalis NOT DETECTED NOT DETECTED Final   Enterococcus Faecium NOT DETECTED NOT DETECTED Final   Listeria monocytogenes NOT DETECTED NOT DETECTED Final   Staphylococcus species DETECTED (A) NOT DETECTED Final    Comment: CRITICAL RESULT CALLED TO, READ BACK BY AND VERIFIED WITH: A PAYTES PHARMD 1425 02/10/21 A BROWNING    Staphylococcus aureus (BCID) NOT DETECTED NOT DETECTED Final   Staphylococcus epidermidis NOT DETECTED NOT DETECTED Final   Staphylococcus lugdunensis NOT DETECTED NOT DETECTED Final   Streptococcus species NOT DETECTED NOT DETECTED Final   Streptococcus agalactiae NOT DETECTED NOT DETECTED Final   Streptococcus pneumoniae NOT DETECTED NOT DETECTED Final   Streptococcus pyogenes NOT DETECTED NOT DETECTED Final    A.calcoaceticus-baumannii NOT DETECTED NOT DETECTED Final   Bacteroides fragilis NOT DETECTED NOT DETECTED Final   Enterobacterales NOT DETECTED NOT DETECTED Final   Enterobacter cloacae complex NOT DETECTED NOT DETECTED Final   Escherichia coli NOT DETECTED NOT DETECTED Final   Klebsiella aerogenes NOT DETECTED NOT DETECTED Final   Klebsiella oxytoca NOT DETECTED NOT DETECTED Final   Klebsiella pneumoniae NOT DETECTED NOT DETECTED Final   Proteus species NOT DETECTED NOT DETECTED Final   Salmonella species NOT DETECTED NOT DETECTED Final   Serratia marcescens NOT DETECTED NOT DETECTED Final   Haemophilus influenzae NOT DETECTED NOT DETECTED Final   Neisseria meningitidis NOT DETECTED NOT DETECTED Final   Pseudomonas aeruginosa NOT DETECTED NOT DETECTED Final   Stenotrophomonas maltophilia NOT DETECTED NOT DETECTED Final   Candida albicans NOT DETECTED NOT DETECTED Final   Candida auris NOT DETECTED NOT DETECTED Final   Candida glabrata NOT DETECTED NOT DETECTED Final   Candida krusei NOT DETECTED NOT DETECTED Final   Candida parapsilosis NOT DETECTED NOT DETECTED Final   Candida tropicalis NOT DETECTED NOT DETECTED Final   Cryptococcus neoformans/gattii NOT DETECTED NOT DETECTED Final    Comment: Performed at [facility], [address] [place], [place] [address]  Blood Culture (routine x 2)     Status: None (Preliminary result)   Collection Time: 02/09/21  7:35 PM   Specimen: BLOOD  Result Value Ref Range Status   Specimen Description BLOOD SITE NOT SPECIFIED  Final   Special Requests   Final    BOTTLES DRAWN  AEROBIC ONLY Blood Culture adequate volume   Culture   Final    NO GROWTH < 24 HOURS Performed at [facility], [place] [address] [place], [place] [address]    Report Status PENDING  Incomplete    RN Pressure Injury Documentation:     Estimated body mass index is 21.73 kg/m as calculated from the following:   Height as of 10/22/20: $RemoveBefo'5\' 1"'wYgZvqlxuSf$  (1.549 m).    Weight as of 10/22/20: 52.2 kg.  Malnutrition Type:   Malnutrition Characteristics:   Nutrition Interventions:     Radiology Studies: DG Chest Port 1 View  Result Date: 02/09/2021 CLINICAL DATA:  Lethargy yesterday. Syncopal episode today with some confusion. EXAM: PORTABLE CHEST 1 VIEW COMPARISON:  07/08/2020 FINDINGS: Cardiac silhouette is normal in size. No mediastinal or hilar masses. Prominent bronchovascular markings. Additional opacity noted the lung bases, greater on the right, consistent with atelectasis and/or scarring. No convincing pneumonia and no evidence of pulmonary edema. No pleural effusion or pneumothorax. Skeletal structures demineralized, but grossly intact. IMPRESSION: 1. No acute cardiopulmonary disease. Electronically Signed   By: Lajean Manes M.D.   On: 02/09/2021 16:46   DG HIP UNILAT WITH PELVIS 2-3 VIEWS LEFT  Result Date: 02/10/2021 CLINICAL DATA:  Fall, left hip pain. EXAM: DG HIP (WITH OR WITHOUT PELVIS) 2-3V LEFT COMPARISON:  None. FINDINGS: Intramedullary nail fixation of the right femur partially visualized on frontal view. Frontal view of the right hip is grossly unremarkable. Lumbosacral surgical hardware noted. There is no evidence of left hip fracture or dislocation. Frontal view of the pelvis demonstrates no acute displaced fracture or diastasis. Other than degenerative changes of the lumbar spine, no severe arthropathy. No aggressive bone abnormality. Limited evaluation of the lumbar spine on frontal view due to overlying bowel gas. Extensive vascular calcifications. IMPRESSION: 1. No acute displaced fracture or dislocation. 2. Extensive vascular calcifications. Electronically Signed   By: Iven Finn M.D.   On: 02/10/2021 00:16   Scheduled Meds: . budesonide  9 mg Oral Daily  . morphine  15 mg Oral TID  . pantoprazole (PROTONIX) IV  40 mg Intravenous Q12H   Continuous Infusions: . cefTRIAXone (ROCEPHIN)  IV      LOS: 1 day   Kerney Elbe, DO [facility] PAGER is on AMION  If 7PM-7AM, please contact night-coverage [email or web address]

## 2021-02-10 NOTE — Progress Notes (Signed)
PHARMACY - PHYSICIAN COMMUNICATION CRITICAL VALUE ALERT - BLOOD CULTURE IDENTIFICATION (BCID)  Denise King is an 70 y.o. female who presented to [facility] on 02/09/2021 with a chief complaint of altered mental status.   Assessment:  1/3 bottles for gram-positive cocci in clusters. BCID showed staph species. Most likely a contaminant.   Name of physician (or Provider) Contacted: Dr. Alfredia Ferguson, DO  Current antibiotics: Ceftriaxone 1g Q24H   Changes to prescribed antibiotics recommended:  NONE  Results for orders placed or performed during the hospital encounter of 02/09/21  Blood Culture ID Panel (Reflexed) (Collected: 02/09/2021  4:36 PM)  Result Value Ref Range   Enterococcus faecalis NOT DETECTED NOT DETECTED   Enterococcus Faecium NOT DETECTED NOT DETECTED   Listeria monocytogenes NOT DETECTED NOT DETECTED   Staphylococcus species DETECTED (A) NOT DETECTED   Staphylococcus aureus (BCID) NOT DETECTED NOT DETECTED   Staphylococcus epidermidis NOT DETECTED NOT DETECTED   Staphylococcus lugdunensis NOT DETECTED NOT DETECTED   Streptococcus species NOT DETECTED NOT DETECTED   Streptococcus agalactiae NOT DETECTED NOT DETECTED   Streptococcus pneumoniae NOT DETECTED NOT DETECTED   Streptococcus pyogenes NOT DETECTED NOT DETECTED   A.calcoaceticus-baumannii NOT DETECTED NOT DETECTED   Bacteroides fragilis NOT DETECTED NOT DETECTED   Enterobacterales NOT DETECTED NOT DETECTED   Enterobacter cloacae complex NOT DETECTED NOT DETECTED   Escherichia coli NOT DETECTED NOT DETECTED   Klebsiella aerogenes NOT DETECTED NOT DETECTED   Klebsiella oxytoca NOT DETECTED NOT DETECTED   Klebsiella pneumoniae NOT DETECTED NOT DETECTED   Proteus species NOT DETECTED NOT DETECTED   Salmonella species NOT DETECTED NOT DETECTED   Serratia marcescens NOT DETECTED NOT DETECTED   Haemophilus influenzae NOT DETECTED NOT DETECTED   Neisseria meningitidis NOT DETECTED NOT DETECTED   Pseudomonas  aeruginosa NOT DETECTED NOT DETECTED   Stenotrophomonas maltophilia NOT DETECTED NOT DETECTED   Candida albicans NOT DETECTED NOT DETECTED   Candida auris NOT DETECTED NOT DETECTED   Candida glabrata NOT DETECTED NOT DETECTED   Candida krusei NOT DETECTED NOT DETECTED   Candida parapsilosis NOT DETECTED NOT DETECTED   Candida tropicalis NOT DETECTED NOT DETECTED   Cryptococcus neoformans/gattii NOT DETECTED NOT DETECTED    Adria Dill, PharmD-Candidate  02/10/2021  2:26 PM

## 2021-02-10 NOTE — ED Notes (Signed)
Pt changed into new depend and repositioned purewick, placed fresh pillow case on pillow for comfort and helped pt reposition

## 2021-02-10 NOTE — ED Notes (Signed)
Pt placed on hospital bed

## 2021-02-10 NOTE — ED Notes (Signed)
Dinner Tray Ordered @ [phone].

## 2021-02-10 NOTE — ED Notes (Signed)
Pt brief changed and peri care done.

## 2021-02-11 ENCOUNTER — Inpatient Hospital Stay (HOSPITAL_COMMUNITY): Payer: Medicare HMO

## 2021-02-11 DIAGNOSIS — M25552 Pain in left hip: Secondary | ICD-10-CM | POA: Diagnosis not present

## 2021-02-11 DIAGNOSIS — F331 Major depressive disorder, recurrent, moderate: Secondary | ICD-10-CM | POA: Diagnosis not present

## 2021-02-11 DIAGNOSIS — G9341 Metabolic encephalopathy: Secondary | ICD-10-CM | POA: Diagnosis not present

## 2021-02-11 DIAGNOSIS — A4151 Sepsis due to Escherichia coli [E. coli]: Principal | ICD-10-CM

## 2021-02-11 DIAGNOSIS — K219 Gastro-esophageal reflux disease without esophagitis: Secondary | ICD-10-CM | POA: Diagnosis not present

## 2021-02-11 LAB — COMPREHENSIVE METABOLIC PANEL
ALT: 11 U/L (ref 0–44)
AST: 13 U/L — ABNORMAL LOW (ref 15–41)
Albumin: 2.7 g/dL — ABNORMAL LOW (ref 3.5–5.0)
Alkaline Phosphatase: 57 U/L (ref 38–126)
Anion gap: 7 (ref 5–15)
BUN: 7 mg/dL — ABNORMAL LOW (ref 8–23)
CO2: 30 mmol/L (ref 22–32)
Calcium: 7.9 mg/dL — ABNORMAL LOW (ref 8.9–10.3)
Chloride: 99 mmol/L (ref 98–111)
Creatinine, Ser: 0.72 mg/dL (ref 0.44–1.00)
GFR, Estimated: >60 mL/min (ref >60)
Glucose, Bld: 101 mg/dL — ABNORMAL HIGH (ref 70–99)
Potassium: 3.8 mmol/L (ref 3.5–5.1)
Sodium: 136 mmol/L (ref 135–145)
Total Bilirubin: 0.5 mg/dL (ref 0.3–1.2)
Total Protein: 5.6 g/dL — ABNORMAL LOW (ref 6.5–8.1)

## 2021-02-11 LAB — CBC WITH DIFFERENTIAL/PLATELET
Abs Immature Granulocytes: 0.03 10*3/uL (ref 0.00–0.07)
Basophils Absolute: 0 10*3/uL (ref 0.0–0.1)
Basophils Relative: 0 %
Eosinophils Absolute: 0.1 10*3/uL (ref 0.0–0.5)
Eosinophils Relative: 1 %
HCT: 30.5 % — ABNORMAL LOW (ref 36.0–46.0)
Hemoglobin: 10.1 g/dL — ABNORMAL LOW (ref 12.0–15.0)
Immature Granulocytes: 0 %
Lymphocytes Relative: 29 %
Lymphs Abs: 3 10*3/uL (ref 0.7–4.0)
MCH: 31.5 pg (ref 26.0–34.0)
MCHC: 33.1 g/dL (ref 30.0–36.0)
MCV: 95 fL (ref 80.0–100.0)
Monocytes Absolute: 1 10*3/uL (ref 0.1–1.0)
Monocytes Relative: 9 %
Neutro Abs: 6.4 10*3/uL (ref 1.7–7.7)
Neutrophils Relative %: 61 %
Platelets: 307 10*3/uL (ref 150–400)
RBC: 3.21 MIL/uL — ABNORMAL LOW (ref 3.87–5.11)
RDW: 12.1 % (ref 11.5–15.5)
WBC: 10.6 10*3/uL — ABNORMAL HIGH (ref 4.0–10.5)
nRBC: 0 % (ref 0.0–0.2)

## 2021-02-11 LAB — MAGNESIUM: Magnesium: 1.7 mg/dL (ref 1.7–2.4)

## 2021-02-11 LAB — PHOSPHORUS: Phosphorus: 2.7 mg/dL (ref 2.5–4.6)

## 2021-02-11 LAB — HM COLONOSCOPY

## 2021-02-11 LAB — CALCIUM, IONIZED: Calcium, Ionized, Serum: 4.5 mg/dL (ref 4.5–5.6)

## 2021-02-11 MED ORDER — ALUM & MAG HYDROXIDE-SIMETH 200-200-20 MG/5ML PO SUSP
30.0000 mL | ORAL | Status: DC | PRN
  Administered 2021-02-11: 30 mL via ORAL
  Filled 2021-02-11: qty 30

## 2021-02-11 MED ORDER — MAGNESIUM SULFATE 2 GM/50ML IV SOLN
2.0000 g | Freq: Once | INTRAVENOUS | Status: AC
  Administered 2021-02-11: 2 g via INTRAVENOUS
  Filled 2021-02-11: qty 50

## 2021-02-11 NOTE — Progress Notes (Signed)
PT Cancellation Note  Patient Details Name: Denise King MRN: [id number] DOB: October 27, 1951   Cancelled Treatment:    Reason Eval/Treat Not Completed: Patient not medically ready  Noted hip xray negative for fracture. CT scan to rule out fracture has not yet been completed. Will await results of CT prior to initiating PT.   Arby Barrette, PT Pager [phone]  Rexanne Mano 02/11/2021, 3:02 PM

## 2021-02-11 NOTE — Progress Notes (Addendum)
PROGRESS NOTE    Denise King  ONG:[id number] DOB: [birth year] DOA: 02/09/2021 PCP: Biagio Borg, MD   Brief Narrative:  HPI per Dr. Babs Bertin on 02/09/21 Denise King is a 70 y.o. female with medical history significant for chronic hypoxic respiratory failure on 2 L continuous supplemental oxygen, COPD, chronic pain syndrome on chronic opioid therapy , chronic diastolic heart failure, depression, who is admitted to [facility] on 02/09/2021 with suspected acute metabolic encephalopathy after presenting from home to [facility] ED for evaluation of altered mental status.   The following history is provided by the patient, her husband, EMS, my discussions with the emergency department physician, and via chart review.  Patient's husband, with whom the patient lives at home, noted the patient to exhibit 1 to 2 days of confusion associated with lethargy and increased somnolence, noting the patient to fall asleep during discussions over that time, which represents a departure from her baseline level of alertness.    The patient reports ground-level fall around 2300 on 02/08/21 in which the trip on the corner of the door frame while attempting to walk back into her house after taking her dogs outside.  She reports that she fell on her left side, landing on her left hip, resulting in worsening of chronic left hip pain.  Correspondingly, she reports worsening of the her sharp left hip pain, with no radiation.  She notes exacerbation of the intensity of this discomfort with ambulation as well as with attempting to bear weight on the left lower extremity, although she acknowledges that she has been able to continue to ambulate over that timeframe in spite of this discomfort.  She reports that she hit her head as a component of this fall, but does not believe that her head was the primary point of contact as a consequence of the above fall.  She denies any ensuing headache, change in vision,  dizziness.  She reports no associated loss of consciousness.  She also denies preceding or ensuing chest pain, palpitations, diaphoresis, presyncope, or syncope. The patient and her husband convey that the patient has exhibited diminished oral intake over the last day, with the patient exhibiting one episode of nonbilious, nonbloody emesis yesterday.  She currently denies any underlying nausea.  She notes 1 to 2 days of new onset dysuria with subjective fever in the absence of chills, full body rigors, or generalized myalgias.  Denies any associated gross hematuria or change in urinary urgency/frequency.  Denies any associated neck stiffness, rhinitis, rhinorrhea, sore throat, shortness of breath, wheezing, cough, nausea, vomiting, abdominal pain, diarrhea, or rash.  No recent traveling or known COVID-19 exposures.  She reports that she has received 2 doses of Pfizer's COVID-19 vaccine in March and April 2021, as well as ensuing booster in November 2021.  Medical history notable for history of chronic pain syndrome on chronic opioid therapy with reported scheduled MS Contin 15 mg p.o. 3 times daily as well as scheduled oxycodone 10 mg p.o. every 6 hours.  This is in addition to use of gabapentin.  Additionally, from my discussions with the inpatient pharmacist, it appears that the patient has been taking Wellbutrin and Zoloft over the last 6 months, with preceding Cymbalta discontinued at least 6 months ago per review of associated psychiatry documentation.  However, approximately 8 days ago, in the setting of PCP follow-up, there was concern for worsening depression prompting PCP to resume the patient's Cymbalta while continuing existing Wellbutrin and Zoloft.   In  the context of he has been his concern for the patient somnolence over the last 1 to 2 days, EMS was contacted, and upon arrival reportedly noted the patient's temperature to be 103.  Consequently, she received 1 g of acetaminophen in route to  [facility] emergency department.   Of note, while EMS documentation conveys a potential syncopal episode over the last day, the patient vehemently denies any recent loss of consciousness.     ED Course:  Vital signs in the ED were notable for the following: Temperature max 100.0; initial heart rate 111, which decreased to 90 following interval administration of IV fluids, as further described above; blood pressure 105/70 - 123/82; respiratory rate 15-23; oxygen saturation 97 to 99% on 4 L [place].  Labs were notable for the following: ABG performed on 4 L nasal cannula showed the following: 7.352/49.9.  CMP was notable for the following: Sodium 134, bicarbonate 22, BUN 14, creatinine 1.09 relative to baseline range of 0.9-1.0, with most recent prior creatinine data point noted to be 0.96 on 01/28/2021, glucose 135.  CBC notable for white blood cell count 10,700, hemoglobin 11.6 relative to 12.8 on 01/28/2021.  Lactic acid 1.2.  INR 1.0.  Urinalysis notable for a cloudy specimen associated with many bacteria, nitrate positive, no squamous epithelial cells, and specific gravity of 1.025.  Nasopharyngeal COVID-19/influenza PCR performed in the ED today found to be negative.  Urine sample was sent for culture, and blood cultures x2 were collected prior to initiation of antibiotics.  EKG, in comparison to most recent prior from 07/05/2020 showed sinus tachycardia with heart rate 109, QTc 478 ms, nonspecific T wave inversion in V1/V2, which is unchanged from his recent prior EKG, and no evidence of ST changes, including no evidence of ST elevation.  Chest x-ray showed no evidence of acute cardiopulmonary process.  While in the ED, the following were administered: Rocephin 1 g IV x1, and a 30 mL/kg LR bolus equating to 1560 cc.  Subsequently, the patient was admitted to the med telemetry floor for further evaluation and management of presenting suspected acute metabolic encephalopathy in the context of severe sepsis  due to suspected UTI.  **Interim History  Her mentation is improved but still becomes confused after her morphine administration so we will need to hold the next dose but nurses complain of significant back pain and hip pain.  Renal function is improving as well and urinalysis reveals an E. coli UTI.  Will need PT OT evaluate and treat but first we will get a CT of the hip given that her significant complaint today..  Assessment & Plan:   Principal Problem:   Acute metabolic encephalopathy Active Problems:   GERD   Depression   Tobacco abuse   Severe sepsis (HCC)   Acute cystitis   Left hip pain  Acute Metabolic Encephalopathy -2 days of reported confusion and somnolence, which is potentially multifactorial in nature, with suspected contribution from physiologic stress stemming from severe sepsis in the setting of suspected urinary tract infection, as further described below.   -There may also be a pharmacologic contribution from the patient's use of chronic opioid use.  Sitter the possibility of contribution from hypercapnic encephalopathy in the setting of COPD and opioid use, although presenting VBG does not appear to be consistent with this.   -Regardless, differential includes polypharmacy, including recent resumption of Cymbalta superimposed on existing Wellbutrin and Zoloft, as further described above.   -Per my discussions with inpatient pharmacy this evening, the recent  resumption of Cymbalta 8 days ago does not warrant any tapering, but rather can be safely discontinued at this time.   -Of note, COVID-19/influenza PCR performed in the ED this evening found to be negative, while presenting chest x-ray shows no evidence of acute cardiopulmonary process. -Following initiation of IV antibiotics for suspected urinary tract infection, the patient's confusion appears to be improving, and I found her to be oriented to person, place, time, and self, although she does remain somnolent,  intermittently falling asleep during our discussions.   -No evidence of acute focal neurologic deficits to suspect acute stroke.   -Should patient's somnolence and continue to improve with treatment of underlying UTI as well as holding of home opioid medications -Will be pursuing CT of the head in the context of recent fall, although this appears to been a ground-level mechanical fall, without associated loss of consciousness in the setting of no evidence of acute focal neurologic deficits. -Will management presenting severe sepsis due to suspected urinary tract infection, as above.   -We will keep in p.o. until the patient has passed nursing bedside swallow evaluation.   -Check urinary drug screen as well as serum ethanol level.  Check TSH.  Repeat CMP and CBC in the morning.  -Will hold home opioid pain medications for now, with plan to resume scheduled MS Contin as somnolence improves, with plan to order as needed oxycodone for breakthrough discomfort as opposed to continuing on scheduled oxycodone.   -Hold gabapentin for now.  -Hold home Cymbalta.  -She takes her opiates however may need to hold given that she becomes confused low but after; Will obtain Head CT to r/o -Delirium Precautions  Severe Sepsis due to E. coli urinary Tract Infection -Diagnosis on the basis of 2 days of dysuria, with urinalysis showing cloudy specimen associated many bacteria that was nitrate positive.   -WBC went from 10.7 -> 11.9 and is now 10.6 -UA was associated with significant pyuria, although in the context of acute urinary symptoms, objective fever per EMS, and criteria for severe sepsis met, will treat as urinary tract infection with initial IV antibiotics, as further described below.   -Overall, SIRS criteria met the presenting objective fever, tachycardia.  Additionally, patient sepsis meets criteria to severe nature in the setting of presenting acute encephalopathy.  Of note, presenting lactic acid found  to be nonelevated at 1.2.  No evidence of associated hypotension thus far.   -Urine specimen was sent for culture and blood cultures x2 were collected prior to initiation of Rocephin in the ED.   -Additionally, the patient has received a 30 mL/kg LR bolus in the ED this evening, as further noted above.  No evidence of additional underlying infectious process at this time, including negative COVID-19/influenza PCR performed in the ED this evening.   -Additionally, chest x-ray shows no evidence of acute cardiopulmonary process, including no evidence of infiltrate. -Blood cultures x2 showed possible contaminant; follow-up on final cultures and will repeat in the morning -Continue Rocephin.  Monitor for results blood cultures x2 as well as urine culture collected in the ED this evening.   -Repeat CBC with differential in the morning.  As needed acetaminophen for fever.  Left hip pain and back pain -Patient reports worsening of her left hip pain over the last several weeks following her reported ground-level mechanical fall on 02/08/2021.   -Left lower extremity appears neurovascular intact, although full neurologic assessment of the left lower remedy is limited by the patient's current level of  pain control.  We will pursue plain films of the left hip, and approach associated pain control conservatively in the context of presenting suspected acute metabolic encephalopathy.  -Left hip x-ray showed "No acute displaced fracture or dislocation. Extensive vascular calcifications." -As somnolence improves, will plan to resume MS Contin, with prn Oxycodone as opposed to her reported home scheduled oxycodone.  As needed Narcan ordered. -Order a CT scan of the hip given her significant pain  Chronic hypoxic respiratory failure -In the setting of documented history COPD, the patient reports that she is on 2 L continuous supplemental oxygen at baseline.   -It appears that patient supplemental oxygen was  empirically increased to 4 L nasal cannula upon arrival in the emergency department in spite of no evidence of acute hypoxic findings.   -On this ensuing rate of 4 L/min nasal cannula, patient's oxygen saturations have been noted to be in the high 90s, and can likely be weaned down back to her baseline 2 L/min given the absence of evidence of acute COPD exacerbation at this time, including reassuring VBG findings, as above. -Continue supplemental oxygen via nasal cannula and wean O2 as tolerated to baseline Continuous pulse oximetry maintain O2 saturation greater than 90% -Continue with albuterol neb as needed -Repeat chest x-ray in a.m.  AKI -Patient's WBC went from 14/1.09 -> 10/0.74 it is 7/0.72 -Avoid Nephrotoxic Medications, Contrast Dyes, Hypotension and Renally adjust medications -Received IVF Hydration for Sepsis protocol -Continue to Monitor and Trend Renal Fxn -Repeat CMP in the AM   GERD -On Protonix twice daily as an outpatient. -Placed on IV PPI twice daily given that she was n.p.o. for her SLP evaluation which can be changed back  Depression -Per Dr. Brett Albino discussion with the inpatient pharmacist yesterday evening, the patient has been taking Wellbutrin and Zoloft for at least the last 6 months, with resumption of Cymbalta by her PCP approximately 8 days ago after being off of this medication for at least the last 6 months.   -Per these discussions with the inpatient pharmacist, initiation of Cymbalta 8 days ago does not warrant any weaning of this medication, but rather it appears safe to discontinue further use of Cymbalta at this time.  -In the setting of presenting suspected acute encephalopathy, will hold Cymbalta for now and attempt to provide some limited to central acting medications, but will plan to resume Wellbutrin and Zoloft as it appears that the patient has been on both of these medications at current dose for at least the last 6 months. -Resume homed  Zoloft and Wellbutrin, while holding recently resume Cymbalta, as further described above. -Continue to Monitor carefully  Normocytic Anemia  -Patient's Hgb/Hct went from 11.6/34.0 -> 10.5/33.6 and is now 10.1/30.5 -Check Anemia Panel in the AM -Continue to Monitor for S/Sx of Bleeding; Currently no overt bleeding noted -Repeat CBC in the AM   Chronic Diastolic Heart Failure -Most recent echocardiogram, which was performed in July 2021, showed LVEF 60 to 65%, no evidence of focal wall motion normalities, grade 1 diastolic dysfunction, no significant valvular pathology, including no evidence of aortic stenosis.  No clinical or radiographic evidence to suggest acutely decompensated heart failure at this time. -She was given IV fluid hydration and will need to continue carefully and fluids have now stopped -Monitor strict I's and O's and daily weights.   -Continue to Monitor for S/Sx of Volume Overload   Chronic Tobacco Abuse -Patient is a current smoker, having smoked 1 pack/day over the last 30  years. -Counseled the patient for the Importance of complete discontinuation of smoking, particular in the setting of a history of chronic hypoxic respiratory failure, as above.  DVT prophylaxis: SCDs Code Status: FULL CODE  Family Communication: Discussed with Daughter ate bedside  Disposition Plan: Pending further evaluation by PT and OT further work-up with CT scan Status is: Inpatient  Remains inpatient appropriate because:Unsafe d/c plan, IV treatments appropriate due to intensity of illness or inability to take PO and Inpatient level of care appropriate due to severity of illness   Dispo: The patient is from: Home              Anticipated d/c is to: Home              Anticipated d/c date is: 2 days              Patient currently is not medically stable to d/c.   Difficult to place patient No  Consultants:   None   Procedures: None  Antimicrobials:  Anti-infectives (From  admission, onward)   Start     Dose/Rate Route Frequency Ordered Stop   02/10/21 2000  cefTRIAXone (ROCEPHIN) 1 g in sodium chloride 0.9 % 100 mL IVPB        1 g 200 mL/hr over 30 Minutes Intravenous Every 24 hours 02/09/21 2324     02/09/21 1830  cefTRIAXone (ROCEPHIN) 1 g in sodium chloride 0.9 % 100 mL IVPB        1 g 200 mL/hr over 30 Minutes Intravenous  Once 02/09/21 1817 02/09/21 2017        Subjective: Seen and examined at bedside and her mentation was much improved but daughter states that she has some periods of confusion and hallucinations.  She complains of significant Left Hip Pain. No lightheadedness or dizziness. No other concerns or complaints at this time.   Objective: Vitals:   02/10/21 2230 02/11/21 0546 02/11/21 0700 02/11/21 1409  BP: 138/73 123/70 124/74 (!) 153/82  Pulse:   76 80  Resp: _0 Temp: 98 F (36.7 C) 98 F (36.7 C) 98.6 F (37 C) 98.7 F (37.1 C)  TempSrc: Oral Oral Oral Oral  SpO2: 97% 97% 95% 99%  Height: _1  (1.6 m)       Intake/Output Summary (Last 24 hours) at 02/11/2021 1648 Last data filed at 02/11/2021 1314 Gross per 24 hour  Intake --  Output 2000 ml  Net -2000 ml   There were no vitals filed for this visit.  Examination: Physical Exam:  Constitutional: Thin chronically ill-appearing Caucasian female currently in pain and appears uncomfortable secondary to pain Eyes: Lids and conjunctivae normal, sclerae anicteric  ENMT: External Ears, Nose appear normal. Grossly normal hearing.  Neck: Appears normal, supple, no cervical masses, normal ROM, no appreciable thyromegaly; no JVD Respiratory: Diminished to auscultation bilaterally with coarse breath sounds, no wheezing, rales, rhonchi or crackles. Normal respiratory effort and patient is not tachypenic. No accessory muscle use. Wearing Supplemental O2 via [place]  Cardiovascular: RRR, no murmurs / rubs / gallops. S1 and S2 auscultated. Trace extremity edema.  Abdomen: Soft,  non-tender, Non-Distended.  Bowel sounds positive.  GU: Deferred. Musculoskeletal: No clubbing / cyanosis of digits/nails.  Pain on the left hip on palpation Skin: No rashes, lesions, ulcers on a limited skin evaluation. No induration; Warm and dry.  Neurologic: CN 2-12 grossly intact with no focal deficits. Romberg sign cerebellar reflexes not assessed.  Psychiatric: Slightly impaired judgment and  insight. Alert and oriented x 3 on my examination but nursing states that she has periods of lucidity and sometimes gets confused and starts hallucinating. Normal mood and appropriate affect.   Data Reviewed: I have personally reviewed following labs and imaging studies  CBC: Recent Labs  Lab 02/09/21 1636 02/09/21 1704 02/10/21 0405 02/11/21 0155  WBC 10.7*  --  11.9* 10.6*  NEUTROABS 8.4*  --  8.7* 6.4  HGB 10.7* 11.6* 10.5* 10.1*  HCT 34.8* 34.0* 33.6* 30.5*  MCV 98.0  --  98.2 95.0  PLT 313  --  292 889   Basic Metabolic Panel: Recent Labs  Lab 02/09/21 1636 02/09/21 1704 02/09/21 2325 02/10/21 0405 02/11/21 0155  NA 134* 134*  --  136 136  K 4.3 4.2  --  4.4 3.8  CL 100  --   --  100 99  CO2 22  --   --  27 30  GLUCOSE 135*  --   --  114* 101*  BUN 14  --   --  10 7*  CREATININE 1.09*  --   --  0.74 0.72  CALCIUM 8.2*  --   --  7.9* 7.9*  MG  --   --  2.1 1.9 1.7  PHOS  --   --   --   --  2.7   GFR: CrCl cannot be calculated (Unknown ideal weight.). Liver Function Tests: Recent Labs  Lab 02/09/21 1636 02/10/21 0405 02/11/21 0155  AST 34 23 13*  ALT _0 ALKPHOS 68 64 57  BILITOT 0.4 0.7 0.5  PROT 6.2* 5.5* 5.6*  ALBUMIN 3.2* 2.8* 2.7*   No results for input(s): LIPASE, AMYLASE in the last 168 hours. No results for input(s): AMMONIA in the last 168 hours. Coagulation Profile: Recent Labs  Lab 02/09/21 1636  INR 1.0   Cardiac Enzymes: No results for input(s): CKTOTAL, CKMB, CKMBINDEX, TROPONINI in the last 168 hours. BNP (last 3 results) No  results for input(s): PROBNP in the last 8760 hours. HbA1C: No results for input(s): HGBA1C in the last 72 hours. CBG: No results for input(s): GLUCAP in the last 168 hours. Lipid Profile: No results for input(s): CHOL, HDL, LDLCALC, TRIG, CHOLHDL, LDLDIRECT in the last 72 hours. Thyroid Function Tests: Recent Labs    02/10/21 0406  TSH 0.175*   Anemia Panel: No results for input(s): VITAMINB12, FOLATE, FERRITIN, TIBC, IRON, RETICCTPCT in the last 72 hours. Sepsis Labs: Recent Labs  Lab 02/09/21 1636  LATICACIDVEN 1.2    Recent Results (from the past 240 hour(s))  Blood Culture (routine x 2)     Status: Abnormal (Preliminary result)   Collection Time: 02/09/21  4:36 PM   Specimen: BLOOD LEFT FOREARM  Result Value Ref Range Status   Specimen Description BLOOD LEFT FOREARM  Final   Special Requests   Final    BOTTLES DRAWN AEROBIC AND ANAEROBIC Blood Culture adequate volume   Culture  Setup Time   Final    GRAM POSITIVE COCCI IN CLUSTERS IN BOTH AEROBIC AND ANAEROBIC BOTTLES CRITICAL RESULT CALLED TO, READ BACK BY AND VERIFIED WITH: A PAYTES [id number] 02/10/21 A BROWNING    Culture (A)  Final    STAPHYLOCOCCUS HOMINIS THE SIGNIFICANCE OF ISOLATING THIS ORGANISM FROM A SINGLE VENIPUNCTURE CANNOT BE PREDICTED WITHOUT FURTHER CLINICAL AND CULTURE CORRELATION. SUSCEPTIBILITIES AVAILABLE ONLY ON REQUEST. Performed at [facility], [place] [address], [place],  [address]    Report Status PENDING  Incomplete  Resp Panel by RT-PCR (Flu A&B, Covid) Nasopharyngeal Swab     Status: None   Collection Time: 02/09/21  4:36 PM   Specimen: Nasopharyngeal Swab; Nasopharyngeal(NP) swabs in vial transport medium  Result Value Ref Range Status   SARS Coronavirus 2 by RT PCR NEGATIVE NEGATIVE Final    Comment: (NOTE) SARS-CoV-2 target nucleic acids are NOT DETECTED.  The SARS-CoV-2 RNA is generally detectable in upper respiratory specimens during the acute phase of infection. The  lowest concentration of SARS-CoV-2 viral copies this assay can detect is 138 copies/mL. A negative result does not preclude SARS-Cov-2 infection and should not be used as the sole basis for treatment or other patient management decisions. A negative result may occur with  improper specimen collection/handling, submission of specimen other than nasopharyngeal swab, presence of viral mutation(s) within the areas targeted by this assay, and inadequate number of viral copies(<138 copies/mL). A negative result must be combined with clinical observations, patient history, and epidemiological information. The expected result is Negative.  Fact Sheet for Patients:  EntrepreneurPulse.com.au  Fact Sheet for Healthcare Providers:  IncredibleEmployment.be  This test is no t yet approved or cleared by the [place] FDA and  has been authorized for detection and/or diagnosis of SARS-CoV-2 by FDA under an Emergency Use Authorization (EUA). This EUA will remain  in effect (meaning this test can be used) for the duration of the COVID-19 declaration under Section 564(b)(1) of the Act, 21 U.S.C.section 360bbb-3(b)(1), unless the authorization is terminated  or revoked sooner.       Influenza A by PCR NEGATIVE NEGATIVE Final   Influenza B by PCR NEGATIVE NEGATIVE Final    Comment: (NOTE) The Xpert Xpress SARS-CoV-2/FLU/RSV plus assay is intended as an aid in the diagnosis of influenza from Nasopharyngeal swab specimens and should not be used as a sole basis for treatment. Nasal washings and aspirates are unacceptable for Xpert Xpress SARS-CoV-2/FLU/RSV testing.  Fact Sheet for Patients: EntrepreneurPulse.com.au  Fact Sheet for Healthcare Providers: IncredibleEmployment.be  This test is not yet approved or cleared by the [place] FDA and has been authorized for detection and/or diagnosis of SARS-CoV-2 by FDA under  an Emergency Use Authorization (EUA). This EUA will remain in effect (meaning this test can be used) for the duration of the COVID-19 declaration under Section 564(b)(1) of the Act, 21 U.S.C. section 360bbb-3(b)(1), unless the authorization is terminated or revoked.  Performed at [facility], [place] [address], [place], [place] [address]   Blood Culture ID Panel (Reflexed)     Status: Abnormal   Collection Time: 02/09/21  4:36 PM  Result Value Ref Range Status   Enterococcus faecalis NOT DETECTED NOT DETECTED Final   Enterococcus Faecium NOT DETECTED NOT DETECTED Final   Listeria monocytogenes NOT DETECTED NOT DETECTED Final   Staphylococcus species DETECTED (A) NOT DETECTED Final    Comment: CRITICAL RESULT CALLED TO, READ BACK BY AND VERIFIED WITH: A PAYTES [id number] [id number] 02/10/21 A BROWNING    Staphylococcus aureus (BCID) NOT DETECTED NOT DETECTED Final   Staphylococcus epidermidis NOT DETECTED NOT DETECTED Final   Staphylococcus lugdunensis NOT DETECTED NOT DETECTED Final   Streptococcus species NOT DETECTED NOT DETECTED Final   Streptococcus agalactiae NOT DETECTED NOT DETECTED Final   Streptococcus pneumoniae NOT DETECTED NOT DETECTED Final   Streptococcus pyogenes NOT DETECTED NOT DETECTED Final   A.calcoaceticus-baumannii NOT DETECTED NOT DETECTED Final   Bacteroides fragilis NOT DETECTED NOT DETECTED Final   Enterobacterales NOT DETECTED NOT DETECTED Final  Enterobacter cloacae complex NOT DETECTED NOT DETECTED Final   Escherichia coli NOT DETECTED NOT DETECTED Final   Klebsiella aerogenes NOT DETECTED NOT DETECTED Final   Klebsiella oxytoca NOT DETECTED NOT DETECTED Final   Klebsiella pneumoniae NOT DETECTED NOT DETECTED Final   Proteus species NOT DETECTED NOT DETECTED Final   Salmonella species NOT DETECTED NOT DETECTED Final   Serratia marcescens NOT DETECTED NOT DETECTED Final   Haemophilus influenzae NOT DETECTED NOT DETECTED Final   Neisseria meningitidis NOT  DETECTED NOT DETECTED Final   Pseudomonas aeruginosa NOT DETECTED NOT DETECTED Final   Stenotrophomonas maltophilia NOT DETECTED NOT DETECTED Final   Candida albicans NOT DETECTED NOT DETECTED Final   Candida auris NOT DETECTED NOT DETECTED Final   Candida glabrata NOT DETECTED NOT DETECTED Final   Candida krusei NOT DETECTED NOT DETECTED Final   Candida parapsilosis NOT DETECTED NOT DETECTED Final   Candida tropicalis NOT DETECTED NOT DETECTED Final   Cryptococcus neoformans/gattii NOT DETECTED NOT DETECTED Final    Comment: Performed at [facility], [place] [address], [place], [place] [address]  Blood Culture (routine x 2)     Status: None (Preliminary result)   Collection Time: 02/09/21  7:35 PM   Specimen: BLOOD  Result Value Ref Range Status   Specimen Description BLOOD SITE NOT SPECIFIED  Final   Special Requests   Final    BOTTLES DRAWN AEROBIC ONLY Blood Culture adequate volume   Culture   Final    NO GROWTH < 24 HOURS Performed at [facility], [place] [address], [place], [place] [address]    Report Status PENDING  Incomplete  Urine culture     Status: Abnormal (Preliminary result)   Collection Time: 02/09/21  9:24 PM   Specimen: In/Out Cath Urine  Result Value Ref Range Status   Specimen Description IN/OUT CATH URINE  Final   Special Requests NONE  Final   Culture (A)  Final    >=100,000 COLONIES/mL ESCHERICHIA COLI SUSCEPTIBILITIES TO FOLLOW Performed at [facility], [place] [address], [place], [place] [address]    Report Status PENDING  Incomplete    RN Pressure Injury Documentation:     Estimated body mass index is 20.37 kg/m as calculated from the following:   Height as of this encounter: $RemoveBeforeD'5\' 3"'TioAuhmPSIOQex$  (1.6 m).   Weight as of 10/22/20: 52.2 kg.  Malnutrition Type:   Malnutrition Characteristics:   Nutrition Interventions:     Radiology Studies: DG HIP UNILAT WITH PELVIS 2-3 VIEWS LEFT  Result Date: 02/10/2021 CLINICAL DATA:  Fall, left hip pain.  EXAM: DG HIP (WITH OR WITHOUT PELVIS) 2-3V LEFT COMPARISON:  None. FINDINGS: Intramedullary nail fixation of the right femur partially visualized on frontal view. Frontal view of the right hip is grossly unremarkable. Lumbosacral surgical hardware noted. There is no evidence of left hip fracture or dislocation. Frontal view of the pelvis demonstrates no acute displaced fracture or diastasis. Other than degenerative changes of the lumbar spine, no severe arthropathy. No aggressive bone abnormality. Limited evaluation of the lumbar spine on frontal view due to overlying bowel gas. Extensive vascular calcifications. IMPRESSION: 1. No acute displaced fracture or dislocation. 2. Extensive vascular calcifications. Electronically Signed   By: Iven Finn M.D.   On: 02/10/2021 00:16   Scheduled Meds: . budesonide  9 mg Oral Daily  . morphine  15 mg Oral TID  . pantoprazole (PROTONIX) IV  40 mg Intravenous Q12H  . sertraline  200 mg Oral  QHS   Continuous Infusions: . cefTRIAXone (ROCEPHIN)  IV Stopped (02/10/21 2237)    LOS: 2 days   Kerney Elbe, DO [facility] PAGER is on [place]  If 7PM-7AM, please contact night-coverage [email or web address]

## 2021-02-11 NOTE — Plan of Care (Signed)
  Problem: Education: Goal: Knowledge of General Education information will improve Description Including pain rating scale, medication(s)/side effects and non-pharmacologic comfort measures Outcome: Progressing   

## 2021-02-12 DIAGNOSIS — G9341 Metabolic encephalopathy: Secondary | ICD-10-CM | POA: Diagnosis not present

## 2021-02-12 DIAGNOSIS — N3000 Acute cystitis without hematuria: Secondary | ICD-10-CM | POA: Diagnosis not present

## 2021-02-12 DIAGNOSIS — M25552 Pain in left hip: Secondary | ICD-10-CM | POA: Diagnosis not present

## 2021-02-12 LAB — CBC WITH DIFFERENTIAL/PLATELET
Abs Immature Granulocytes: 0.03 10*3/uL (ref 0.00–0.07)
Basophils Absolute: 0 10*3/uL (ref 0.0–0.1)
Basophils Relative: 0 %
Eosinophils Absolute: 0.2 10*3/uL (ref 0.0–0.5)
Eosinophils Relative: 2 %
HCT: 36.5 % (ref 36.0–46.0)
Hemoglobin: 11.6 g/dL — ABNORMAL LOW (ref 12.0–15.0)
Immature Granulocytes: 0 %
Lymphocytes Relative: 26 %
Lymphs Abs: 2.4 10*3/uL (ref 0.7–4.0)
MCH: 30.2 pg (ref 26.0–34.0)
MCHC: 31.8 g/dL (ref 30.0–36.0)
MCV: 95.1 fL (ref 80.0–100.0)
Monocytes Absolute: 0.9 10*3/uL (ref 0.1–1.0)
Monocytes Relative: 9 %
Neutro Abs: 5.7 10*3/uL (ref 1.7–7.7)
Neutrophils Relative %: 63 %
Platelets: 350 10*3/uL (ref 150–400)
RBC: 3.84 MIL/uL — ABNORMAL LOW (ref 3.87–5.11)
RDW: 11.9 % (ref 11.5–15.5)
WBC: 9.2 10*3/uL (ref 4.0–10.5)
nRBC: 0 % (ref 0.0–0.2)

## 2021-02-12 LAB — RETICULOCYTES
Immature Retic Fract: 13.2 % (ref 2.3–15.9)
RBC.: 3.74 MIL/uL — ABNORMAL LOW (ref 3.87–5.11)
Retic Count, Absolute: 68.4 10*3/uL (ref 19.0–186.0)
Retic Ct Pct: 1.8 % (ref 0.4–3.1)

## 2021-02-12 LAB — IRON AND TIBC
Iron: 49 ug/dL (ref 28–170)
Saturation Ratios: 20 % (ref 10.4–31.8)
TIBC: 248 ug/dL — ABNORMAL LOW (ref 250–450)
UIBC: 199 ug/dL

## 2021-02-12 LAB — URINE CULTURE: Culture: >=100000 — AB

## 2021-02-12 LAB — COMPREHENSIVE METABOLIC PANEL
ALT: 9 U/L (ref 0–44)
AST: 13 U/L — ABNORMAL LOW (ref 15–41)
Albumin: 2.9 g/dL — ABNORMAL LOW (ref 3.5–5.0)
Alkaline Phosphatase: 65 U/L (ref 38–126)
Anion gap: 11 (ref 5–15)
BUN: 7 mg/dL — ABNORMAL LOW (ref 8–23)
CO2: 29 mmol/L (ref 22–32)
Calcium: 8.4 mg/dL — ABNORMAL LOW (ref 8.9–10.3)
Chloride: 97 mmol/L — ABNORMAL LOW (ref 98–111)
Creatinine, Ser: 0.64 mg/dL (ref 0.44–1.00)
GFR, Estimated: >60 mL/min (ref >60)
Glucose, Bld: 76 mg/dL (ref 70–99)
Potassium: 4.1 mmol/L (ref 3.5–5.1)
Sodium: 137 mmol/L (ref 135–145)
Total Bilirubin: 0.6 mg/dL (ref 0.3–1.2)
Total Protein: 5.9 g/dL — ABNORMAL LOW (ref 6.5–8.1)

## 2021-02-12 LAB — PHOSPHORUS: Phosphorus: 3.6 mg/dL (ref 2.5–4.6)

## 2021-02-12 LAB — MAGNESIUM: Magnesium: 2.4 mg/dL (ref 1.7–2.4)

## 2021-02-12 LAB — FERRITIN: Ferritin: 203 ng/mL (ref 11–307)

## 2021-02-12 LAB — FOLATE: Folate: 9.8 ng/mL (ref >5.9)

## 2021-02-12 LAB — VITAMIN B12: Vitamin B-12: 182 pg/mL (ref 180–914)

## 2021-02-12 LAB — CULTURE, BLOOD (ROUTINE X 2): Special Requests: ADEQUATE

## 2021-02-12 MED ORDER — VITAMIN B-12 1000 MCG PO TABS
1000.0000 ug | ORAL_TABLET | Freq: Every day | ORAL | Status: DC
  Administered 2021-02-12 – 2021-02-21 (×10): 1000 ug via ORAL
  Filled 2021-02-12 (×10): qty 1

## 2021-02-12 MED ORDER — PANTOPRAZOLE SODIUM 40 MG PO TBEC
40.0000 mg | DELAYED_RELEASE_TABLET | Freq: Two times a day (BID) | ORAL | Status: DC
  Administered 2021-02-12 – 2021-02-21 (×20): 40 mg via ORAL
  Filled 2021-02-12 (×21): qty 1

## 2021-02-12 MED ORDER — CEPHALEXIN 500 MG PO CAPS
500.0000 mg | ORAL_CAPSULE | Freq: Three times a day (TID) | ORAL | Status: AC
  Administered 2021-02-12 – 2021-02-15 (×11): 500 mg via ORAL
  Filled 2021-02-12 (×11): qty 1

## 2021-02-12 NOTE — Care Management Important Message (Signed)
Important Message  Patient Details  Name: Denise King MRN: [id number] Date of Birth: 02/22/1951   Medicare Important Message Given:  Yes     Cleburne Savini [address] 02/12/2021, 3:43 PM

## 2021-02-12 NOTE — Evaluation (Signed)
Physical Therapy Evaluation Patient Details Name: Denise King MRN: [id number] DOB: [birth year] Today's Date: 02/12/2021   History of Present Illness  70 y.o. female with medical history significant for chronic hypoxic respiratory failure on 2 L continuous supplemental oxygen, COPD, chronic pain syndrome on chronic opioid therapy , chronic diastolic heart failure, depression, who is admitted to [facility] on 02/09/2021 with suspected acute metabolic encephalopathy. Patient reported fall on 2/12 (tripped on doorframe) landing on left hip. Xray and CT hip negative for fracture. +hit her head but denied LOC; severe sepsis due to UTI  Clinical Impression   Pt admitted with above diagnosis. Patient was independent PTA however admits to 3 falls in 3 years. Most recent fall was a trip over doorframe. She reports she has broken toes on rt foot in addition to left hip and back pain. Pain currently limiting her activity tolerance and requiring more assist as her pain worsened over course of the session. Pt is highly motivated and anticipate as pain control improved, she will make steady progress.  Pt currently with functional limitations due to the deficits listed below (see PT Problem List). Pt will benefit from skilled PT to increase their independence and safety with mobility to allow discharge to the venue listed below.       Follow Up Recommendations CIR    Equipment Recommendations  None recommended by PT    Recommendations for Other Services Rehab consult     Precautions / Restrictions Precautions Precautions: Fall Precaution Comments: 3 falls in 3 years Restrictions Weight Bearing Restrictions: No      Mobility  Bed Mobility Overal bed mobility: Needs Assistance Bed Mobility: Supine to Sit     Supine to sit: Min assist     General bed mobility comments: to raise torso    Transfers Overall transfer level: Needs assistance Equipment used: Rolling walker (2  wheeled) Transfers: Sit to/from Stand Sit to Stand: Min assist;Mod assist         General transfer comment: assist with forward translation over BOS; initial transfer min assist; with incr pain 2nd and 3rd transfers with mod assist  Ambulation/Gait Ambulation/Gait assistance: Min guard;+2 safety/equipment Gait Distance (Feet): 8 Feet Assistive device: Rolling walker (2 wheeled) Gait Pattern/deviations: Step-to pattern     General Gait Details: flexed torso; vc for unweighting LLE through bil UEs via RW  Stairs            Wheelchair Mobility    Modified Rankin (Stroke Patients Only)       Balance Overall balance assessment: History of Falls                                           Pertinent Vitals/Pain Pain Assessment: 0-10 Pain Score: 3  Pain Location: left hip Pain Descriptors / Indicators: Aching Pain Intervention(s): Limited activity within patient's tolerance;Monitored during session;Premedicated before session;Repositioned;Patient requesting pain meds-RN notified    Home Living Family/patient expects to be discharged to:: Private residence Living Arrangements: Spouse/significant other Available Help at Discharge: Family (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 (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 his rt hand)) Type of Home: House Home Access: Stairs to enter Entrance Stairs-Rails: Left Entrance Stairs-Number of Steps: 4+1 Home Layout: One level Home Equipment: Walker - 2 wheels;Walker - 4 wheels;Cane - single point;Shower seat - built in;Grab bars - tub/shower;Hand held shower head      Prior Function Level of  Independence: Independent with assistive device(s)         Comments: began using a rollator after her recent fall; 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     Hand Dominance   Dominant Hand: Right    Extremity/Trunk Assessment   Upper Extremity Assessment Upper Extremity Assessment: Defer to OT evaluation    Lower Extremity Assessment Lower Extremity  Assessment: Generalized weakness (difficult to assess due to pain L hip and back)    Cervical / Trunk Assessment Cervical / Trunk Assessment: Kyphotic  Communication   Communication: No difficulties  Cognition Arousal/Alertness: Awake/alert Behavior During Therapy: WFL for tasks assessed/performed Overall Cognitive Status: Within Functional Limits for tasks assessed                                        General Comments General comments (skin integrity, edema, etc.): patient's pain continued to incr during session until she was essentially NWB on LLE while standing at sink    Exercises     Assessment/Plan    PT Assessment Patient needs continued PT services  PT Problem List Decreased strength;Decreased activity tolerance;Decreased balance;Decreased mobility;Pain       PT Treatment Interventions DME instruction;Gait training;Stair training;Functional mobility training;Therapeutic activities;Therapeutic exercise;Patient/family education    PT Goals (Current goals can be found in the Care Plan section)  Acute Rehab PT Goals Patient Stated Goal: to return home to husband PT Goal Formulation: With patient Time For Goal Achievement: 02/26/21 Potential to Achieve Goals: Good    Frequency Min 4X/week   Barriers to discharge Decreased caregiver support      Co-evaluation PT/OT/SLP Co-Evaluation/Treatment: Yes Reason for Co-Treatment: To address functional/ADL transfers;Other (comment) (due to anticipated pain in back and hip) PT goals addressed during session: Mobility/safety with mobility;Proper use of DME         AM-PAC PT "6 Clicks" Mobility  Outcome Measure Help needed turning from your back to your side while in a flat bed without using bedrails?: A Little Help needed moving from lying on your back to sitting on the side of a flat bed without using bedrails?: A Little Help needed moving to and from a bed to a chair (including a wheelchair)?: A  Lot Help needed standing up from a chair using your arms (e.g., wheelchair or bedside chair)?: A Lot Help needed to walk in hospital room?: A Lot Help needed climbing 3-5 steps with a railing? : Total 6 Click Score: 13    End of Session Equipment Utilized During Treatment: Gait belt Activity Tolerance: Patient limited by pain Patient left: in bed;with call bell/phone within reach;with bed alarm set Nurse Communication: Mobility status;Patient requests pain meds PT Visit Diagnosis: Difficulty in walking, not elsewhere classified (R26.2);Pain Pain - Right/Left: Left Pain - part of body: Hip (and back)    Time: 5974-1638 PT Time Calculation (min) (ACUTE ONLY): 39 min   Charges:   PT Evaluation $PT Eval Moderate Complexity: 1 Mod PT Treatments $Therapeutic Activity: 8-22 mins         Arby Barrette, PT Pager [phone]   Rexanne Mano 02/12/2021, 9:51 AM

## 2021-02-12 NOTE — Progress Notes (Signed)
[facility] PROGRESS NOTE   Denise King ZHY:[id number] DOB: 05/17/1951 DOA: 02/09/2021  PCP: Biagio Borg, MD  Brief History/Interval Summary: 70 y.o.femalewith medical history significant forchronic hypoxic respiratory failure on 2 L continuous supplemental oxygen, COPD, chronic pain syndrome on chronic opioid therapy,chronic diastolic heart failure, depression,who was admitted to [facility] on2/13/2022 with altered mental status.  Thought to be secondary to UTI.   Consultants: None  Procedures: None  Antibiotics: Anti-infectives (From admission, onward)   Start     Dose/Rate Route Frequency Ordered Stop   02/12/21 1400  cephALEXin (KEFLEX) capsule 500 mg        500 mg Oral Every 8 hours 02/12/21 0908 02/15/21 2359   02/10/21 2000  cefTRIAXone (ROCEPHIN) 1 g in sodium chloride 0.9 % 100 mL IVPB  Status:  Discontinued        1 g 200 mL/hr over 30 Minutes Intravenous Every 24 hours 02/09/21 2324 02/12/21 0908   02/09/21 1830  cefTRIAXone (ROCEPHIN) 1 g in sodium chloride 0.9 % 100 mL IVPB        1 g 200 mL/hr over 30 Minutes Intravenous  Once 02/09/21 1817 02/09/21 2017      Subjective/Interval History: Patient mentions that she is feeling slightly better in terms of her mentation.  Weakness is also improving.  Continues to have pain in the left hip area.    Assessment/Plan:  Acute metabolic encephalopathy Most likely secondary to urinary tract infection.  CT scan of the head did not show any acute findings.  Mentation seems to be gradually improving.  Patient's home opioid pain medication regimen was held.  Patient is noted to be on MS Contin at home.  Since mentation is improving we can certainly resume some of her medications.  Her gabapentin and Cymbalta are on hold.  No focal neurological deficits noted. TSH noted to be 0.175.  We will check free T4.  Urine drug screen positive only for opiates.  CT head was repeated last night which was  also unremarkable for any acute findings.  E. coli urinary tract infection with sepsis present on admission/staph hominis bacteremia Seems to be improving.  Sensitivities reviewed.  Changed to Keflex today.  We will plan for a 7-day course. Staph hominis noted on blood cultures.  Likely contaminant.  Left hip pain This is chronic for the most part.  She does have decreased range of motion of the right and left lower extremities left more than right.  Imaging studies did not show any obvious fractures.  She does have reasonably good range of motion of her hip and knees on passive movement.  CT was done which did not show any acute fractures.  Osteoarthritis was noted.  PT and OT.  Will likely need rehab of some sort.  Chronic hypoxic respiratory failure/history of COPD On 2 L of oxygen at home.  Respiratory status seems to be stable at this time.  Acute kidney injury Renal function seems to have improved.  History of GERD PPI  History of depression Has been taking Wellbutrin and Zoloft for at least 6 months.  Cymbalta was resumed by PCP about 8 days prior to admission.  Cymbalta was subsequently discontinued.  Patient noted to be on Zoloft.  Normocytic anemia Hemoglobin is stable.  No evidence of overt bleeding.  Chronic diastolic CHF Most recent echocardiogram done last July showed normal systolic function.  Appears to be reasonably compensated at this time.  Continue to monitor.  Chronic tobacco  abuse She was counseled.   DVT Prophylaxis:   SCDs Code Status: Full code Family Communication: Discussed with the patient Disposition Plan: Inpatient rehab recommended by PT and OT  Status is: Inpatient  Remains inpatient appropriate because:Ongoing active pain requiring inpatient pain management and Altered mental status   Dispo: The patient is from: Home              Anticipated d/c is to: [facility]              Anticipated d/c date is: 2 days              Patient currently is not  medically stable to d/c.   Difficult to place patient No       Medications:  Scheduled: . budesonide  9 mg Oral Daily  . cephALEXin  500 mg Oral Q8H  . morphine  15 mg Oral TID  . pantoprazole  40 mg Oral BID  . sertraline  200 mg Oral QHS   Continuous:  MBT:DHRCBULAGTXMI **OR** acetaminophen, alum & mag hydroxide-simeth, melatonin, nicotine, ondansetron (ZOFRAN) IV, oxyCODONE   Objective:  Vital Signs  Vitals:   02/11/21 1409 02/11/21 1915 02/11/21 1940 02/12/21 0507  BP: (!) 153/82 124/68 129/66 115/66  Pulse: 80 97 87 76  Resp: 14  18 18   Temp: 98.7 F (37.1 C)  98.1 F (36.7 C) 98 F (36.7 C)  TempSrc: Oral  Axillary Axillary  SpO2: 99% 91%  94%  Height:        Intake/Output Summary (Last 24 hours) at 02/12/2021 1120 Last data filed at 02/12/2021 0500 Gross per 24 hour  Intake 390.28 ml  Output 1300 ml  Net -909.72 ml   There were no vitals filed for this visit.  General appearance: Awake alert.  In no distress Resp: Clear to auscultation bilaterally.  Normal effort Cardio: S1-S2 is normal regular.  No S3-S4.  No rubs murmurs or bruit GI: Abdomen is soft.  Nontender nondistended.  Bowel sounds are present normal.  No masses organomegaly Extremities: No edema.  Good range of motion of both lower extremities on passive movement.  No pain with straight leg raising test Neurologic: Alert and oriented x3.  No focal neurological deficits.    Lab Results:  Data Reviewed: I have personally reviewed following labs and imaging studies  CBC: Recent Labs  Lab 02/09/21 1636 02/09/21 1704 02/10/21 0405 02/11/21 0155 02/12/21 0317  WBC 10.7*  --  11.9* 10.6* 9.2  NEUTROABS 8.4*  --  8.7* 6.4 5.7  HGB 10.7* 11.6* 10.5* 10.1* 11.6*  HCT 34.8* 34.0* 33.6* 30.5* 36.5  MCV 98.0  --  98.2 95.0 95.1  PLT 313  --  292 307 680    Basic Metabolic Panel: Recent Labs  Lab 02/09/21 1636 02/09/21 1704 02/09/21 2325 02/10/21 0405 02/11/21 0155 02/12/21 0317   NA 134* 134*  --  136 136 137  K 4.3 4.2  --  4.4 3.8 4.1  CL 100  --   --  100 99 97*  CO2 22  --   --  27 30 29   GLUCOSE 135*  --   --  114* 101* 76  BUN 14  --   --  10 7* 7*  CREATININE 1.09*  --   --  0.74 0.72 0.64  CALCIUM 8.2*  --   --  7.9* 7.9* 8.4*  MG  --   --  2.1 1.9 1.7 2.4  PHOS  --   --   --   --  2.7 3.6    GFR: CrCl cannot be calculated (Unknown ideal weight.).  Liver Function Tests: Recent Labs  Lab 02/09/21 1636 02/10/21 0405 02/11/21 0155 02/12/21 0317  AST 34 23 13* 13*  ALT 13 13 11 9   ALKPHOS 68 64 57 65  BILITOT 0.4 0.7 0.5 0.6  PROT 6.2* 5.5* 5.6* 5.9*  ALBUMIN 3.2* 2.8* 2.7* 2.9*     Coagulation Profile: Recent Labs  Lab 02/09/21 1636  INR 1.0     Thyroid Function Tests: Recent Labs    02/10/21 0406  TSH 0.175*    Anemia Panel: Recent Labs    02/12/21 0317  VITAMINB12 182  FOLATE 9.8  FERRITIN 203  TIBC 248*  IRON 49  RETICCTPCT 1.8    Recent Results (from the past 240 hour(s))  Blood Culture (routine x 2)     Status: Abnormal   Collection Time: 02/09/21  4:36 PM   Specimen: BLOOD LEFT FOREARM  Result Value Ref Range Status   Specimen Description BLOOD LEFT FOREARM  Final   Special Requests   Final    BOTTLES DRAWN AEROBIC AND ANAEROBIC Blood Culture adequate volume   Culture  Setup Time   Final    GRAM POSITIVE COCCI IN CLUSTERS IN BOTH AEROBIC AND ANAEROBIC BOTTLES CRITICAL RESULT CALLED TO, READ BACK BY AND VERIFIED WITH: A PAYTES PHARMD 1425 02/10/21 A BROWNING    Culture (A)  Final    STAPHYLOCOCCUS HOMINIS THE SIGNIFICANCE OF ISOLATING THIS ORGANISM FROM A SINGLE SET OF BLOOD CULTURES WHEN MULTIPLE SETS ARE DRAWN IS UNCERTAIN. PLEASE NOTIFY THE MICROBIOLOGY DEPARTMENT WITHIN ONE WEEK IF SPECIATION AND SENSITIVITIES ARE REQUIRED. Performed at [facility], [place] [address] [place], [place] [address]    Report Status 02/12/2021 FINAL  Final  Resp Panel by RT-PCR (Flu A&B, Covid) Nasopharyngeal Swab      Status: None   Collection Time: 02/09/21  4:36 PM   Specimen: Nasopharyngeal Swab; Nasopharyngeal(NP) swabs in vial transport medium  Result Value Ref Range Status   SARS Coronavirus 2 by RT PCR NEGATIVE NEGATIVE Final    Comment: (NOTE) SARS-CoV-2 target nucleic acids are NOT DETECTED.  The SARS-CoV-2 RNA is generally detectable in upper respiratory specimens during the acute phase of infection. The lowest concentration of SARS-CoV-2 viral copies this assay can detect is 138 copies/mL. A negative result does not preclude SARS-Cov-2 infection and should not be used as the sole basis for treatment or other patient management decisions. A negative result may occur with  improper specimen collection/handling, submission of specimen other than nasopharyngeal swab, presence of viral mutation(s) within the areas targeted by this assay, and inadequate number of viral copies(<138 copies/mL). A negative result must be combined with clinical observations, patient history, and epidemiological information. The expected result is Negative.  Fact Sheet for Patients:  EntrepreneurPulse.com.au  Fact Sheet for Healthcare Providers:  IncredibleEmployment.be  This test is no t yet approved or cleared by the [place] FDA and  has been authorized for detection and/or diagnosis of SARS-CoV-2 by FDA under an Emergency Use Authorization (EUA). This EUA will remain  in effect (meaning this test can be used) for the duration of the COVID-19 declaration under Section 564(b)(1) of the Act, 21 U.S.C.section 360bbb-3(b)(1), unless the authorization is terminated  or revoked sooner.       Influenza A by PCR NEGATIVE NEGATIVE Final   Influenza B by PCR NEGATIVE NEGATIVE Final    Comment: (NOTE) The Xpert Xpress SARS-CoV-2/FLU/RSV plus assay is  intended as an aid in the diagnosis of influenza from Nasopharyngeal swab specimens and should not be used as a sole basis  for treatment. Nasal washings and aspirates are unacceptable for Xpert Xpress SARS-CoV-2/FLU/RSV testing.  Fact Sheet for Patients: EntrepreneurPulse.com.au  Fact Sheet for Healthcare Providers: IncredibleEmployment.be  This test is not yet approved or cleared by the [place] FDA and has been authorized for detection and/or diagnosis of SARS-CoV-2 by FDA under an Emergency Use Authorization (EUA). This EUA will remain in effect (meaning this test can be used) for the duration of the COVID-19 declaration under Section 564(b)(1) of the Act, 21 U.S.C. section 360bbb-3(b)(1), unless the authorization is terminated or revoked.  Performed at [facility], [place] [address], [place], [place] [address]   Blood Culture ID Panel (Reflexed)     Status: Abnormal   Collection Time: 02/09/21  4:36 PM  Result Value Ref Range Status   Enterococcus faecalis NOT DETECTED NOT DETECTED Final   Enterococcus Faecium NOT DETECTED NOT DETECTED Final   Listeria monocytogenes NOT DETECTED NOT DETECTED Final   Staphylococcus species DETECTED (A) NOT DETECTED Final    Comment: CRITICAL RESULT CALLED TO, READ BACK BY AND VERIFIED WITH: A PAYTES PHARMD 1425 02/10/21 A BROWNING    Staphylococcus aureus (BCID) NOT DETECTED NOT DETECTED Final   Staphylococcus epidermidis NOT DETECTED NOT DETECTED Final   Staphylococcus lugdunensis NOT DETECTED NOT DETECTED Final   Streptococcus species NOT DETECTED NOT DETECTED Final   Streptococcus agalactiae NOT DETECTED NOT DETECTED Final   Streptococcus pneumoniae NOT DETECTED NOT DETECTED Final   Streptococcus pyogenes NOT DETECTED NOT DETECTED Final   A.calcoaceticus-baumannii NOT DETECTED NOT DETECTED Final   Bacteroides fragilis NOT DETECTED NOT DETECTED Final   Enterobacterales NOT DETECTED NOT DETECTED Final   Enterobacter cloacae complex NOT DETECTED NOT DETECTED Final   Escherichia coli NOT DETECTED NOT DETECTED Final    Klebsiella aerogenes NOT DETECTED NOT DETECTED Final   Klebsiella oxytoca NOT DETECTED NOT DETECTED Final   Klebsiella pneumoniae NOT DETECTED NOT DETECTED Final   Proteus species NOT DETECTED NOT DETECTED Final   Salmonella species NOT DETECTED NOT DETECTED Final   Serratia marcescens NOT DETECTED NOT DETECTED Final   Haemophilus influenzae NOT DETECTED NOT DETECTED Final   Neisseria meningitidis NOT DETECTED NOT DETECTED Final   Pseudomonas aeruginosa NOT DETECTED NOT DETECTED Final   Stenotrophomonas maltophilia NOT DETECTED NOT DETECTED Final   Candida albicans NOT DETECTED NOT DETECTED Final   Candida auris NOT DETECTED NOT DETECTED Final   Candida glabrata NOT DETECTED NOT DETECTED Final   Candida krusei NOT DETECTED NOT DETECTED Final   Candida parapsilosis NOT DETECTED NOT DETECTED Final   Candida tropicalis NOT DETECTED NOT DETECTED Final   Cryptococcus neoformans/gattii NOT DETECTED NOT DETECTED Final    Comment: Performed at [facility], [address], [place], [place] [address]  Blood Culture (routine x 2)     Status: None (Preliminary result)   Collection Time: 02/09/21  7:35 PM   Specimen: BLOOD  Result Value Ref Range Status   Specimen Description BLOOD SITE NOT SPECIFIED  Final   Special Requests   Final    BOTTLES DRAWN AEROBIC ONLY Blood Culture adequate volume   Culture   Final    NO GROWTH 3 DAYS Performed at [facility], [address], [place], [place] [address]    Report Status PENDING  Incomplete  Urine culture     Status: Abnormal  Collection Time: 02/09/21  9:24 PM   Specimen: In/Out Cath Urine  Result Value Ref Range Status   Specimen Description IN/OUT CATH URINE  Final   Special Requests   Final    NONE Performed at [facility], [place] [address] [place], [place] [address]    Culture >=100,000 COLONIES/mL ESCHERICHIA COLI (A)  Final   Report Status 02/12/2021 FINAL  Final   Organism ID, Bacteria ESCHERICHIA COLI (A)  Final       Susceptibility   Escherichia coli - MIC*    AMPICILLIN 8 SENSITIVE Sensitive     CEFAZOLIN <=4 SENSITIVE Sensitive     CEFEPIME <=0.12 SENSITIVE Sensitive     CEFTRIAXONE <=0.25 SENSITIVE Sensitive     CIPROFLOXACIN <=0.25 SENSITIVE Sensitive     GENTAMICIN <=1 SENSITIVE Sensitive     IMIPENEM <=0.25 SENSITIVE Sensitive     NITROFURANTOIN 32 SENSITIVE Sensitive     TRIMETH/SULFA <=20 SENSITIVE Sensitive     AMPICILLIN/SULBACTAM 4 SENSITIVE Sensitive     PIP/TAZO <=4 SENSITIVE Sensitive     * >=100,000 COLONIES/mL ESCHERICHIA COLI      Radiology Studies: CT HEAD WO CONTRAST  Result Date: 02/11/2021 CLINICAL DATA:  Encephalopathy EXAM: CT HEAD WITHOUT CONTRAST TECHNIQUE: Contiguous axial images were obtained from the base of the skull through the vertex without intravenous contrast. COMPARISON:  01/28/2021 FINDINGS: Brain: There is no mass, hemorrhage or extra-axial collection. The size and configuration of the ventricles and extra-axial CSF spaces are normal. There is hypoattenuation of the white matter, most commonly indicating chronic small vessel disease. Vascular: Atherosclerotic calcification of the internal carotid arteries at the skull base. No abnormal hyperdensity of the major intracranial arteries or dural venous sinuses. Skull: The visualized skull base, calvarium and extracranial soft tissues are normal. Sinuses/Orbits: No fluid levels or advanced mucosal thickening of the visualized paranasal sinuses. No mastoid or middle ear effusion. The orbits are normal. IMPRESSION: Chronic small vessel disease without acute intracranial abnormality. Electronically Signed   By: Ulyses Jarred M.D.   On: 02/11/2021 21:54   CT HIP LEFT WO CONTRAST  Result Date: 02/12/2021 CLINICAL DATA:  Left hip pain after fall on 02/08/2021 EXAM: CT OF THE LEFT HIP WITHOUT CONTRAST TECHNIQUE: Multidetector CT imaging of the left hip was performed according to the standard protocol. Multiplanar CT image  reconstructions were also generated. COMPARISON:  X-ray 02/10/2021 FINDINGS: Bones/Joint/Cartilage No acute fracture. No dislocation. Mild left hip joint space narrowing. No appreciable hip joint effusion. Pubic symphysis and visualized portion of the left sacroiliac joint are intact without diastasis. Osseous structures appear slightly demineralized. No suspicious lytic or sclerotic bone lesion. Ligaments Suboptimally assessed by CT. Muscles and Tendons No acute musculotendinous abnormality by CT. Soft tissues No soft tissue fluid collection or hematoma. No left inguinal lymphadenopathy. Prominent atherosclerotic vascular calcifications. IMPRESSION: 1. No acute fracture or dislocation of the left hip. 2. Mild left hip osteoarthritis. Electronically Signed   By: Davina Poke D.O.   On: 02/12/2021 08:09       LOS: 3 days   [facility] Pager on [email or web address]  02/12/2021, 11:20 AM

## 2021-02-12 NOTE — Evaluation (Signed)
Occupational Therapy Evaluation Patient Details Name: Denise King MRN: [id number] DOB: 1951/06/11 Today's Date: 02/12/2021    History of Present Illness 70 y.o. female with medical history significant for chronic hypoxic respiratory failure on 2 L continuous supplemental oxygen, COPD, chronic pain syndrome on chronic opioid therapy , chronic diastolic heart failure, depression, who is admitted to [facility] on 02/09/2021 with suspected acute metabolic encephalopathy. Patient reported fall on 2/12 (tripped on doorframe) landing on left hip. Xray and CT hip negative for fracture. +hit her head but denied LOC; severe sepsis due to UTI   Clinical Impression   PTA, pt lives with spouse (that she assists/provides supervision for at home) and reports Modified Independent with ADLs, IADLs and mobility using Rollator. Pt presents with deficits noted below and primary limitation today being pain. Pt overall Min A for bed mobility, Min A initially for transfers and short mobility to sink but requiring increasing physical assist by end of session due to pain. Pt Min A for grooming tasks standing at sink for short period, Setup for other UB ADLs and Max A for LB ADLs. Pt with high PLOF and motivated to increase independence to return home to husband. Anticipate once pain more controlled, pt will progress well towards goals.     Follow Up Recommendations  CIR    Equipment Recommendations  3 in 1 bedside commode    Recommendations for Other Services Rehab consult     Precautions / Restrictions Precautions Precautions: Fall Precaution Comments: 3 falls in 3 years Restrictions Weight Bearing Restrictions: No      Mobility Bed Mobility Overal bed mobility: Needs Assistance Bed Mobility: Supine to Sit     Supine to sit: Min assist     General bed mobility comments: to raise torso    Transfers Overall transfer level: Needs assistance Equipment used: Rolling walker (2  wheeled) Transfers: Sit to/from [facility] Sit to Stand: Mod assist Stand pivot transfers: Min assist       General transfer comment: assist with forward translation over BOS; initial transfer min assist; with incr pain 2nd and 3rd transfers with mod assist. Min A for turning/negotiating walker with turning    Balance Overall balance assessment: History of Falls                                         ADL either performed or assessed with clinical judgement   ADL Overall ADL's : Needs assistance/impaired Eating/Feeding: Set up;Sitting   Grooming: Minimal assistance;Standing;Oral care Grooming Details (indicate cue type and reason): Min A with assist needed for bimanual tasks in standing due to attempting to offload L LE pressure with UE support. Difficulty standing for duration of task due to pain Upper Body Bathing: Set up;Sitting   Lower Body Bathing: Maximal assistance;Sit to/from stand;Sitting/lateral leans   Upper Body Dressing : Set up;Sitting   Lower Body Dressing: Maximal assistance;Sit to/from stand;Sitting/lateral leans   Toilet Transfer: Minimal assistance;Stand-pivot;RW   Toileting- Clothing Manipulation and Hygiene: Maximal assistance;Sit to/from stand;Sitting/lateral lean       Functional mobility during ADLs: Minimal assistance;Rolling walker;Cueing for safety;Cueing for sequencing General ADL Comments: Pt limited mainly by pain, decreased strength and standing balance secondary to increasing pain     Vision Baseline Vision/History: Wears glasses Wears Glasses: At all times Patient Visual Report: No change from baseline Vision Assessment?: No apparent visual deficits  Perception     Praxis      Pertinent Vitals/Pain Pain Assessment: 0-10 Pain Score: 3  Pain Location: left hip Pain Descriptors / Indicators: Aching Pain Intervention(s): Monitored during session;Patient requesting pain meds-RN notified;Repositioned  (3/10 at rest, increasing to 7/10 with activity)     Hand Dominance Right   Extremity/Trunk Assessment Upper Extremity Assessment Upper Extremity Assessment: Generalized weakness   Lower Extremity Assessment Lower Extremity Assessment: Defer to PT evaluation   Cervical / Trunk Assessment Cervical / Trunk Assessment: Kyphotic   Communication Communication Communication: No difficulties   Cognition Arousal/Alertness: Awake/alert Behavior During Therapy: WFL for tasks assessed/performed Overall Cognitive Status: Within Functional Limits for tasks assessed                                 General Comments: A&Ox4, some memory deficits though self corrects easily   General Comments  Pain increasing during standing with pt eventually treating L LE as NWB. IV bleed noted during session - RN notified and present at end of session. Pt on 2 L O2 at start of session at 93%. Trialed on RA with SpO2 85% on RA - pt reports may be due to holding breath secondary to pain    Exercises     Shoulder Instructions      Home Living Family/patient expects to be discharged to:: Private residence Living Arrangements: Spouse/significant other Available Help at Discharge: Family (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 (can't use his rt hand)) Type of Home: House Home Access: Stairs to enter [employer] of Steps: 4+1 Entrance Stairs-Rails: Left Home Layout: One level     Bathroom Shower/Tub: Occupational psychologist: Standard Bathroom Accessibility: Yes   Home Equipment: Environmental consultant - 2 wheels;Walker - 4 wheels;Cane - single point;Shower seat - built in;Grab bars - tub/shower;Hand held shower head          Prior Functioning/Environment Level of Independence: Independent with assistive device(s)        Comments: began using a rollator after her recent fall; 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 Pt drives, grocery shops.  Enjoys cooking but limited at times due to chronic back pain        OT Problem List: Decreased strength;Decreased activity tolerance;Impaired balance (sitting and/or standing);Decreased knowledge of use of DME or AE;Pain;Decreased knowledge of precautions      OT Treatment/Interventions: Self-care/ADL training;Therapeutic exercise;DME and/or AE instruction;Therapeutic activities;Patient/family education;Balance training    OT Goals(Current goals can be found in the care plan section) Acute Rehab OT Goals Patient Stated Goal: to return home to husband OT Goal Formulation: With patient Time For Goal Achievement: 02/26/21 Potential to Achieve Goals: Good ADL Goals Pt Will Perform Grooming: with modified independence;standing Pt Will Perform Lower Body Bathing: with modified independence;sitting/lateral leans;sit to/from stand Pt Will Perform Lower Body Dressing: with modified independence;sitting/lateral leans;sit to/from stand;with adaptive equipment Pt Will Transfer to Toilet: with modified independence;ambulating Pt Will Perform Toileting - Clothing Manipulation and hygiene: with modified independence;sitting/lateral leans;sit to/from stand  OT Frequency: Min 2X/week   Barriers to D/C:            Co-evaluation PT/OT/SLP Co-Evaluation/Treatment: Yes Reason for Co-Treatment: To address functional/ADL transfers;Other (comment) (due to anticipated pain in back/hip) PT goals addressed during session: Mobility/safety with mobility;Proper use of DME OT goals addressed during session: ADL's and self-care      AM-PAC OT "6 Clicks" Daily Activity  Outcome Measure Help from another person eating meals?: A Little Help from another person taking care of personal grooming?: A Little Help from another person toileting, which includes using toliet, bedpan, or urinal?: A Lot Help from another person bathing (including washing, rinsing, drying)?: A Lot Help from another person to put on and  taking off regular upper body clothing?: A Little Help from another person to put on and taking off regular lower body clothing?: A Lot 6 Click Score: 15   End of Session Equipment Utilized During Treatment: Rolling walker;Gait belt Nurse Communication: Mobility status;Patient requests pain meds;Other (comment) (IV bleed)  Activity Tolerance: Patient limited by pain Patient left: in bed;with call bell/phone within reach;with bed alarm set;with nursing/sitter in room  OT Visit Diagnosis: Unsteadiness on feet (R26.81);Other abnormalities of gait and mobility (R26.89);Muscle weakness (generalized) (M62.81);Pain Pain - Right/Left: Left Pain - part of body: Leg;Hip (and back)                Time: 5726-2035 OT Time Calculation (min): 44 min Charges:  OT General Charges $OT Visit: 1 Visit OT Evaluation $OT Eval Moderate Complexity: 1 Mod  Marcene Brawn, OTR/L 02/12/2021, 10:36 AM

## 2021-02-12 NOTE — Progress Notes (Signed)
Inpatient Rehab Admissions Coordinator Note:   Per therapy recommendations, pt was screened for CIR candidacy by Shann Medal, PT, DPT.  Note that [facility] unlikely to approve diagnosis of septic UTI for CIR.  Would not recommend consult at this time.  Please contact me with questions.   Shann Medal, PT, DPT [phone] 02/12/21 1:19 PM

## 2021-02-13 DIAGNOSIS — M25552 Pain in left hip: Secondary | ICD-10-CM | POA: Diagnosis not present

## 2021-02-13 DIAGNOSIS — N3000 Acute cystitis without hematuria: Secondary | ICD-10-CM | POA: Diagnosis not present

## 2021-02-13 DIAGNOSIS — G9341 Metabolic encephalopathy: Secondary | ICD-10-CM | POA: Diagnosis not present

## 2021-02-13 LAB — T4, FREE: Free T4: 0.79 ng/dL (ref 0.61–1.12)

## 2021-02-13 MED ORDER — METHOCARBAMOL 500 MG PO TABS
500.0000 mg | ORAL_TABLET | Freq: Three times a day (TID) | ORAL | Status: DC
  Administered 2021-02-13 – 2021-02-14 (×4): 500 mg via ORAL
  Filled 2021-02-13 (×4): qty 1

## 2021-02-13 MED ORDER — BUPROPION HCL ER (SR) 150 MG PO TB12
150.0000 mg | ORAL_TABLET | Freq: Every day | ORAL | Status: DC
  Administered 2021-02-13 – 2021-02-21 (×9): 150 mg via ORAL
  Filled 2021-02-13 (×9): qty 1

## 2021-02-13 NOTE — TOC Initial Note (Addendum)
Transition of Care [facility]) - Initial/Assessment Note    Patient Details  Name: Denise King MRN: [id number] Date of Birth: [birth year]  Transition of Care [facility]) CM/SW Contact:    Angelita Ingles, RN Phone Number: [phone]  02/13/2021, 3:32 PM  Clinical Narrative:                 [facility] consulted for SNF placement for rehab. Patient is from home with husband where she normally functions independently. Patient is agreeable to SNF placement for rehab in order to get her strength back so that she can walk again. Patient states that it is currently too painful for her to walk. Patient reports being from home with her husband where she hopes to return once she regains her strength. Currently has no HH services or DME. SNF list has been given to the patient and patient gives verbal consent for her info to be faxed out to seek bed offers. FL2 completed, PASRR [id number] A. Patient has [place] [facility] so facility has to [employer]  TOC will continue to follow for placement.     Expected Discharge Plan: Skilled Nursing Facility Barriers to Discharge: Continued Medical Work up   Patient Goals and CMS Choice Patient states their goals for this hospitalization and ongoing recovery are:: Want to be able to walk and go home with husband. CMS Medicare.gov Compare Post Acute Care list provided to:: Patient Choice offered to / list presented to : Patient  Expected Discharge Plan and Services Expected Discharge Plan: [place] In-house Referral: NA Discharge Planning Services: CM Consult Post Acute Care Choice: [place] Living arrangements for the past 2 months: Single Family Home                 DME Arranged: N/A DME Agency: NA       HH Arranged: Patient Refused [place] Agency: NA        Prior Living Arrangements/Services Living arrangements for the past 2 months: Single Family Home Lives with:: Spouse Patient language and need for interpreter reviewed::  Yes Do you feel safe going back to the place where you live?: Yes      Need for Family Participation in Patient Care: Yes (Comment) Care giver support system in place?: Yes (comment) Current home services:  (none) Criminal Activity/Legal Involvement Pertinent to Current Situation/Hospitalization: No - Comment as needed  Activities of Daily Living Home Assistive Devices/Equipment: Environmental consultant (specify type) ADL Screening (condition at time of admission) Patient's cognitive ability adequate to safely complete daily activities?: Yes Is the patient deaf or have difficulty hearing?: No Does the patient have difficulty seeing, even when wearing glasses/contacts?: No Does the patient have difficulty concentrating, remembering, or making decisions?: No Patient able to express need for assistance with ADLs?: Yes Does the patient have difficulty dressing or bathing?: Yes Independently performs ADLs?: Yes (appropriate for developmental age) Does the patient have difficulty walking or climbing stairs?: Yes Weakness of Legs: Both Weakness of Arms/Hands: None  Permission Sought/Granted   Permission granted to share information with : No              Emotional Assessment Appearance:: Appears stated age Attitude/Demeanor/Rapport: Gracious Affect (typically observed): Accepting,Appropriate Orientation: : Oriented to Self,Oriented to Place,Oriented to  Time,Oriented to Situation Alcohol / Substance Use: Tobacco Use,Alcohol Use Psych Involvement: No (comment)  Admission diagnosis:  Metabolic encephalopathy [F74.94] Left hip pain [M25.552] Acute cystitis without hematuria [W96.75] Acute metabolic encephalopathy [F16.38] Patient Active Problem List   Diagnosis Date  Noted  . Severe sepsis ([place]) 02/10/2021  . Acute cystitis 02/10/2021  . Left hip pain 02/10/2021  . Acute metabolic encephalopathy 93/81/8299  . Toe fracture, right 02/01/2021  . Concussion 02/01/2021  . Floaters, bilateral  01/31/2021  . Acute on chronic respiratory failure with hypoxia and hypercapnia ([place]) 07/10/2020  . Acute on chronic heart failure ([place]) 07/04/2020  . Choledocholithiasis 02/22/2020  . Lymphocytic colitis 02/16/2020  . Duodenal ulcer hemorrhagic   . Malnutrition of moderate degree 01/01/2020  . Acute respiratory failure with hypoxia and hypercapnia ([place]) 12/28/2019  . Bipolar disorder ([place]) 12/28/2019  . Alcohol abuse 12/28/2019  . Acute on chronic heart failure with preserved ejection fraction ([place]) 12/28/2019  . Abnormal MRI of abdomen   . Cholelithiasis   . Common bile duct stone 12/23/2019  . Acute cholecystitis 12/23/2019  . Polycythemia 09/11/2019  . Right knee pain 08/09/2019  . Quadriceps contusion 08/09/2019  . Abnormal cardiac CT angiography 05/11/2019  . Chest pain 04/11/2019  . Acute viral syndrome 04/11/2019  . Tobacco abuse 04/11/2019  . Hyperphosphatemia 04/11/2019  . SOB (shortness of breath) 04/07/2019  . Hip fracture ([place]) 10/01/2018  . Dysuria 05/20/2018  . Right leg swelling 05/20/2018  . CAP (community acquired pneumonia) 05/13/2018  . Tachycardia 05/13/2018  . Chest wall pain 05/13/2018  . Narcotic poisoning ([place]) 05/13/2018  . EtOH dependence ([place])   . Polypharmacy   . Esophagitis   . Preventative health care 12/22/2017  . Erythrocytosis 12/22/2017  . Depression 12/22/2017  . Hyperglycemia 12/22/2017  . Right sided sciatica 12/22/2017  . Cough 06/10/2017  . Family history of colon cancer - brother and father 03/10/2016  . Insomnia 02/11/2016  . Generalized anxiety disorder 11/25/2015  . Urinary frequency 04/25/2015  . Nausea and vomiting in adult 11/08/2014  . Cigarette smoker 08/15/2013  . Otitis media 10/27/2012  . Abdominal pain 03/30/2012  . HEPATIC CYST 02/18/2010  . Chronic pain syndrome 02/08/2010  . Vitamin B12 deficiency 10/04/2009  . GERD 01/04/2009  . ABDOMINAL PAIN-EPIGASTRIC 01/04/2009  . DYSTHYMIA 08/10/2008  . Venous  (peripheral) insufficiency 08/10/2008  . Irritable bowel syndrome 08/10/2008  . Back pain 08/10/2008  . CIGARETTE SMOKER 03/06/2008  . Hx of adenomatous polyp of colon 12/06/2007  . BRONCHITIS, RECURRENT 12/06/2007  . DIVERTICULOSIS OF COLON 12/06/2007  . OSTEOPENIA 12/06/2007  . CALCULUS, KIDNEY 10/07/2007  . VERTIGO 10/07/2007  . Headache(784.0) 10/07/2007   PCP:  Biagio Borg, MD Pharmacy:   [facility], [address] [place] [address] Phone: [phone] Fax: [phone]     Social Determinants of Health (SDOH) Interventions    Readmission Risk Interventions Readmission Risk Prevention Plan 07/10/2020  Transportation Screening Complete  PCP or Specialist Appt within 3-5 Days Complete  HRI or [place] Complete  Social Work Consult for [place] Planning/Counseling Complete  Palliative Care Screening Not Applicable  Some recent data might be hidden

## 2021-02-13 NOTE — Progress Notes (Signed)
Physical Therapy Treatment Patient Details Name: Denise King MRN: [id number] DOB: 05/29/1951 Today's Date: 02/13/2021    History of Present Illness 70 y.o. female with medical history significant for chronic hypoxic respiratory failure on 2 L continuous supplemental oxygen, COPD, chronic pain syndrome on chronic opioid therapy , chronic diastolic heart failure, depression, who is admitted to [facility] on 02/09/2021 with suspected acute metabolic encephalopathy. Patient reported fall on 2/12 (tripped on doorframe) landing on left hip. Xray and CT hip negative for fracture. +hit her head but denied LOC; severe sepsis due to UTI    PT Comments    Patient remains limited by left hip pain (see actually points to area of piriformis and sciatic nerve, not her hip). She rated pain 7/10 at rest and 7.5/10 with activity. She was better able to bear some weight through LLE compared to 02/12/21. She is now in agreement with need for therapy/rehab prior to discharge home. See updated recommendations.     Follow Up Recommendations  SNF     Equipment Recommendations  None recommended by PT    Recommendations for Other Services       Precautions / Restrictions Precautions Precautions: Fall Precaution Comments: 3 falls in 3 years Restrictions Weight Bearing Restrictions: No    Mobility  Bed Mobility Overal bed mobility: Needs Assistance Bed Mobility: Supine to Sit;Sit to Supine     Supine to sit: Min guard Sit to supine: Min assist   General bed mobility comments: assist to raise legs onto bed    Transfers Overall transfer level: Needs assistance Equipment used: Rolling walker (2 wheeled) Transfers: Sit to/from Stand Sit to Stand: Min assist         General transfer comment: slight steadying assist upon standing with mild dizziness noted  Ambulation/Gait Ambulation/Gait assistance: Min guard Gait Distance (Feet): 25 Feet Assistive device: Rolling walker (2  wheeled) Gait Pattern/deviations: Step-to pattern     General Gait Details: flexed torso; vc for unweighting LLE through bil UEs via RW; improved WB'g through LLE compared to 2/16   Stairs             Wheelchair Mobility    Modified Rankin (Stroke Patients Only)       Balance Overall balance assessment: History of Falls                                          Cognition Arousal/Alertness: Awake/alert Behavior During Therapy: WFL for tasks assessed/performed Overall Cognitive Status: Within Functional Limits for tasks assessed                                        Exercises General Exercises - Lower Extremity Ankle Circles/Pumps: AROM;Both;10 reps Quad Sets: AROM;Both;5 reps Gluteal Sets: AROM;Both;5 reps Heel Slides: AAROM;Both;5 reps Hip ABduction/ADduction: AROM;AAROM;Right;5 reps    General Comments        Pertinent Vitals/Pain Pain Assessment: 0-10 Pain Score: 7  Pain Location: left hip Pain Descriptors / Indicators: Aching;Grimacing;Guarding;Cramping Pain Intervention(s): Limited activity within patient's tolerance;Monitored during session;Other (comment);Relaxation (gentle stretching)    Home Living                      Prior Function            PT Goals (current  goals can now be found in the care plan section) Acute Rehab PT Goals Patient Stated Goal: to return home to husband Time For Goal Achievement: 02/26/21 Potential to Achieve Goals: Good Progress towards PT goals: Progressing toward goals    Frequency    Min 4X/week      PT Plan Discharge plan needs to be updated    Co-evaluation              AM-PAC PT "6 Clicks" Mobility   Outcome Measure  Help needed turning from your back to your side while in a flat bed without using bedrails?: A Little Help needed moving from lying on your back to sitting on the side of a flat bed without using bedrails?: A Little Help needed moving  to and from a bed to a chair (including a wheelchair)?: A Little Help needed standing up from a chair using your arms (e.g., wheelchair or bedside chair)?: A Little Help needed to walk in hospital room?: A Lot Help needed climbing 3-5 steps with a railing? : Total 6 Click Score: 15    End of Session Equipment Utilized During Treatment: Oxygen Activity Tolerance: Patient limited by pain Patient left: in bed;with call bell/phone within reach   PT Visit Diagnosis: Difficulty in walking, not elsewhere classified (R26.2);Pain Pain - Right/Left: Left Pain - part of body: Hip (and back)     Time: 1245-8099 PT Time Calculation (min) (ACUTE ONLY): 33 min  Charges:  $Gait Training: 8-22 mins $Therapeutic Exercise: 8-22 mins                      Arby Barrette, PT Pager [phone]    Rexanne Mano 02/13/2021, 11:55 AM

## 2021-02-13 NOTE — NC FL2 (Signed)
[place] LEVEL OF CARE SCREENING TOOL     IDENTIFICATION  Patient Name: Denise King Birthdate: [birth year] Sex: female Admission Date (Current Location): 02/09/2021  [facility] and [place] Number:  Herbalist and Address:  The [place]. [facility], [place] [address] [place], [place] [address]      Provider Number:    Attending Physician Name and Address:  Bonnielee Haff, MD  Relative Name and Phone Number:  Atiyah Bauer    Current Level of Care: Hospital Recommended Level of Care: [place] Prior Approval Number:    Date Approved/Denied:   PASRR Number: [id number]  Discharge Plan: SNF    Current Diagnoses: Patient Active Problem List   Diagnosis Date Noted  . Severe sepsis ([place]) 02/10/2021  . Acute cystitis 02/10/2021  . Left hip pain 02/10/2021  . Acute metabolic encephalopathy 21/30/8657  . Toe fracture, right 02/01/2021  . Concussion 02/01/2021  . Floaters, bilateral 01/31/2021  . Acute on chronic respiratory failure with hypoxia and hypercapnia ([place]) 07/10/2020  . Acute on chronic heart failure ([place]) 07/04/2020  . Choledocholithiasis 02/22/2020  . Lymphocytic colitis 02/16/2020  . Duodenal ulcer hemorrhagic   . Malnutrition of moderate degree 01/01/2020  . Acute respiratory failure with hypoxia and hypercapnia ([place]) 12/28/2019  . Bipolar disorder ([place]) 12/28/2019  . Alcohol abuse 12/28/2019  . Acute on chronic heart failure with preserved ejection fraction ([place]) 12/28/2019  . Abnormal MRI of abdomen   . Cholelithiasis   . Common bile duct stone 12/23/2019  . Acute cholecystitis 12/23/2019  . Polycythemia 09/11/2019  . Right knee pain 08/09/2019  . Quadriceps contusion 08/09/2019  . Abnormal cardiac CT angiography 05/11/2019  . Chest pain 04/11/2019  . Acute viral syndrome 04/11/2019  . Tobacco abuse 04/11/2019  . Hyperphosphatemia 04/11/2019  . SOB (shortness of breath) 04/07/2019  . Hip  fracture ([place]) 10/01/2018  . Dysuria 05/20/2018  . Right leg swelling 05/20/2018  . CAP (community acquired pneumonia) 05/13/2018  . Tachycardia 05/13/2018  . Chest wall pain 05/13/2018  . Narcotic poisoning ([place]) 05/13/2018  . EtOH dependence ([place])   . Polypharmacy   . Esophagitis   . Preventative health care 12/22/2017  . Erythrocytosis 12/22/2017  . Depression 12/22/2017  . Hyperglycemia 12/22/2017  . Right sided sciatica 12/22/2017  . Cough 06/10/2017  . Family history of colon cancer - brother and father 03/10/2016  . Insomnia 02/11/2016  . Generalized anxiety disorder 11/25/2015  . Urinary frequency 04/25/2015  . Nausea and vomiting in adult 11/08/2014  . Cigarette smoker 08/15/2013  . Otitis media 10/27/2012  . Abdominal pain 03/30/2012  . HEPATIC CYST 02/18/2010  . Chronic pain syndrome 02/08/2010  . Vitamin B12 deficiency 10/04/2009  . GERD 01/04/2009  . ABDOMINAL PAIN-EPIGASTRIC 01/04/2009  . DYSTHYMIA 08/10/2008  . Venous (peripheral) insufficiency 08/10/2008  . Irritable bowel syndrome 08/10/2008  . Back pain 08/10/2008  . CIGARETTE SMOKER 03/06/2008  . Hx of adenomatous polyp of colon 12/06/2007  . BRONCHITIS, RECURRENT 12/06/2007  . DIVERTICULOSIS OF COLON 12/06/2007  . OSTEOPENIA 12/06/2007  . CALCULUS, KIDNEY 10/07/2007  . VERTIGO 10/07/2007  . Headache(784.0) 10/07/2007    Orientation RESPIRATION BLADDER Height & Weight     Self,Time,Situation,Place  Normal Continent Weight: 56.2 kg Height:  5\' 3"  (160 cm)  BEHAVIORAL SYMPTOMS/MOOD NEUROLOGICAL BOWEL NUTRITION STATUS   (n/a)  (n/a) Continent Diet  AMBULATORY STATUS COMMUNICATION OF NEEDS Skin   Limited Assist (rolling walker) Verbally Normal  Personal Care Assistance Level of Assistance  Bathing,Feeding,Dressing,Total care Bathing Assistance: Limited assistance Feeding assistance: Limited assistance Dressing Assistance: Limited assistance Total Care Assistance:  Limited assistance   Functional Limitations Info  Sight,Hearing,Speech Sight Info: Adequate Hearing Info: Adequate Speech Info: Adequate    SPECIAL CARE FACTORS FREQUENCY  PT (By licensed PT),OT (By licensed OT)     PT Frequency: 5X OT Frequency: 5X            Contractures Contractures Info: Not present    Additional Factors Info  Code Status,Allergies,Psychotropic,Isolation Precautions,Suctioning Needs Code Status Info: Full Allergies Info: Atorvastatin, Levofloxacin, Omnicef , Trazodone And Nefazodone, Sulfa Antibiotics, Sulfonamide Derivatives Psychotropic Info: wellbutrin 150 mg QD, zoloft 200 mg Q HS   Isolation Precautions Info: None Suctioning Needs: n/a   Current Medications (02/13/2021):  This is the current hospital active medication list Current Facility-Administered Medications  Medication Dose Route Frequency Provider Last Rate Last Admin  . acetaminophen (TYLENOL) tablet 650 mg  650 mg Oral Q6H PRN Howerter, Justin B, DO   650 mg at 02/11/21 1944   Or  . acetaminophen (TYLENOL) suppository 650 mg  650 mg Rectal Q6H PRN Howerter, Justin B, DO      . alum & mag hydroxide-simeth (MAALOX/MYLANTA) 200-200-20 MG/5ML suspension 30 mL  30 mL Oral Q4H PRN Raiford Noble Latif, DO   30 mL at 02/11/21 2245  . budesonide (ENTOCORT EC) 24 hr capsule 9 mg  9 mg Oral Daily Howerter, Justin B, DO   9 mg at 02/13/21 0936  . buPROPion (WELLBUTRIN SR) 12 hr tablet 150 mg  150 mg Oral Daily Bonnielee Haff, MD   150 mg at 02/13/21 1210  . cephALEXin (KEFLEX) capsule 500 mg  500 mg Oral Q8H Pierce, Dwayne A, RPH   500 mg at 02/13/21 1210  . melatonin tablet 3 mg  3 mg Oral QHS PRN Opyd, Ilene Qua, MD   3 mg at 02/12/21 2157  . methocarbamol (ROBAXIN) tablet 500 mg  500 mg Oral TID Bonnielee Haff, MD   500 mg at 02/13/21 1210  . morphine (MS CONTIN) 12 hr tablet 15 mg  15 mg Oral TID Howerter, Justin B, DO   15 mg at 02/13/21 0936  . nicotine (NICODERM CQ - dosed in mg/24 hours)  patch 21 mg  21 mg Transdermal Daily PRN Howerter, Justin B, DO   21 mg at 02/10/21 1713  . ondansetron (ZOFRAN) injection 4 mg  4 mg Intravenous Q6H PRN Howerter, Justin B, DO   4 mg at 02/13/21 0031  . oxyCODONE (Oxy IR/ROXICODONE) immediate release tablet 10 mg  10 mg Oral QID PRN Howerter, Justin B, DO   10 mg at 02/13/21 2355  . pantoprazole (PROTONIX) EC tablet 40 mg  40 mg Oral BID Bonnielee Haff, MD   40 mg at 02/13/21 0936  . sertraline (ZOLOFT) tablet 200 mg  200 mg Oral QHS Opyd, Ilene Qua, MD   200 mg at 02/12/21 2157  . vitamin B-12 (CYANOCOBALAMIN) tablet 1,000 mcg  1,000 mcg Oral Daily Bonnielee Haff, MD   1,000 mcg at 02/13/21 7322     Discharge Medications: Please see discharge summary for a list of discharge medications.  Relevant Imaging Results:  Relevant Lab Results:   Additional Information SS# [id number]  Angelita Ingles, RN

## 2021-02-13 NOTE — Progress Notes (Addendum)
[facility] PROGRESS NOTE   Denise King:[id number] DOB: 10/23/1951 DOA: 02/09/2021  PCP: Biagio Borg, MD  Brief History/Interval Summary: 70 y.o.femalewith medical history significant forchronic hypoxic respiratory failure on 2 L continuous supplemental oxygen, COPD, chronic pain syndrome on chronic opioid therapy,chronic diastolic heart failure, depression,who was admitted to [facility] on2/13/2022 with altered mental status.  Thought to be secondary to UTI.   Consultants: None  Procedures: None  Antibiotics: Anti-infectives (From admission, onward)   Start     Dose/Rate Route Frequency Ordered Stop   02/12/21 1400  cephALEXin (KEFLEX) capsule 500 mg        500 mg Oral Every 8 hours 02/12/21 0908 02/15/21 2359   02/10/21 2000  cefTRIAXone (ROCEPHIN) 1 g in sodium chloride 0.9 % 100 mL IVPB  Status:  Discontinued        1 g 200 mL/hr over 30 Minutes Intravenous Every 24 hours 02/09/21 2324 02/12/21 0908   02/09/21 1830  cefTRIAXone (ROCEPHIN) 1 g in sodium chloride 0.9 % 100 mL IVPB        1 g 200 mL/hr over 30 Minutes Intravenous  Once 02/09/21 1817 02/09/21 2017       Subjective/Interval History: Patient mentions that overall she is feeling better.  Continues to have pain in the back of her left leg near the thigh area.      Assessment/Plan:  Acute metabolic encephalopathy Most likely secondary to urinary tract infection.  CT scan of the head did not show any acute findings.  Some outpatient medications were held.  Mentation seems to be back to baseline.   No focal neurological deficits noted. TSH noted to be 0.175.  Free T4 is noted to be normal at 0.79.  Likely sick euthyroid.    E. coli urinary tract infection with sepsis present on admission/staph hominis bacteremia Seems to be improving.  Sensitivities reviewed.  Changed to Keflex.  We will plan for a 7-day course. Staph hominis noted on blood cultures.  Likely  contaminant.  Left hip/posterior thigh pain No clear etiology for same.  No obvious lesions noted on examination.  CT hip did not show any fractures.  The pain is mostly in the muscle area posteriorly.  We will try some muscle relaxants.  She does have good range of motion of her hip joint on the left.  PT and OT.    Chronic hypoxic respiratory failure/history of COPD On 2 L of oxygen at home.  Respiratory status seems to be stable at this time.  Acute kidney injury Renal function seems to have improved.  History of GERD PPI  History of depression Has been taking Wellbutrin and Zoloft for at least 6 months.  Cymbalta was resumed by PCP about 8 days prior to admission.  Cymbalta was subsequently discontinued.  Patient noted to be on Zoloft.  Resume Wellbutrin.  Normocytic anemia Hemoglobin is stable.  No evidence of overt bleeding.  Chronic diastolic CHF Most recent echocardiogram done last July showed normal systolic function.  Appears to be reasonably compensated at this time.  Continue to monitor.  Chronic tobacco abuse She was counseled.  Chronic pain syndrome Patient noted to be on MS Contin which is being continued.  PDMP database was reviewed.  Patient also on oxycodone 10 mg.  Was also on buprenorphine patch but apparently not taking it anymore.  History of lymphocytic colitis On budesonide.  Follows with Dr. Carlean Purl with gastroenterology.   DVT Prophylaxis:   SCDs Code  Status: Full code Family Communication: Discussed with the patient Disposition Plan: Not a good candidate for inpatient rehab.  Will likely need to go to a skilled nursing facility.  Status is: Inpatient  Remains inpatient appropriate because:Ongoing active pain requiring inpatient pain management and Altered mental status   Dispo: The patient is from: Home              Anticipated d/c is to: CIR              Anticipated d/c date is: 2 days              Patient currently is not medically stable to  d/c.   Difficult to place patient No       Medications:  Scheduled: . budesonide  9 mg Oral Daily  . cephALEXin  500 mg Oral Q8H  . morphine  15 mg Oral TID  . pantoprazole  40 mg Oral BID  . sertraline  200 mg Oral QHS  . vitamin B-12  1,000 mcg Oral Daily   Continuous:  JJK:[id number] **OR** acetaminophen, alum & mag hydroxide-simeth, melatonin, nicotine, ondansetron (ZOFRAN) IV, oxyCODONE   Objective:  Vital Signs  Vitals:   02/12/21 1157 02/12/21 1952 02/13/21 0500 02/13/21 0750  BP: (!) 104/54 136/87  (!) 142/75  Pulse: 85 86    Resp: 18 19  18   Temp: 98.6 F (37 C) 97.9 F (36.6 C)  98.3 F (36.8 C)  TempSrc:    Oral  SpO2: 93% 98%  92%  Weight:   56.2 kg   Height:       No intake or output data in the 24 hours ending 02/13/21 1017 Filed Weights   02/13/21 0500  Weight: 56.2 kg    General appearance: Awake alert.  In no distress Resp: Clear to auscultation bilaterally.  Normal effort Cardio: S1-S2 is normal regular.  No S3-S4.  No rubs murmurs or bruit GI: Abdomen is soft.  Nontender nondistended.  Bowel sounds are present normal.  No masses organomegaly Extremities: No edema.  No obvious lesions or masses identified in the left lower extremity.  Passive range of motion is noted to be normal of the hip and the knee joints. Neurologic: Alert and oriented x3.  No focal neurological deficits.     Lab Results:  Data Reviewed: I have personally reviewed following labs and imaging studies  CBC: Recent Labs  Lab 02/09/21 1636 02/09/21 1704 02/10/21 0405 02/11/21 0155 02/12/21 0317  WBC 10.7*  --  11.9* 10.6* 9.2  NEUTROABS 8.4*  --  8.7* 6.4 5.7  HGB 10.7* 11.6* 10.5* 10.1* 11.6*  HCT 34.8* 34.0* 33.6* 30.5* 36.5  MCV 98.0  --  98.2 95.0 95.1  PLT 313  --  292 307 967    Basic Metabolic Panel: Recent Labs  Lab 02/09/21 1636 02/09/21 1704 02/09/21 2325 02/10/21 0405 02/11/21 0155 02/12/21 0317  NA 134* 134*  --  136 136 137  K 4.3  4.2  --  4.4 3.8 4.1  CL 100  --   --  100 99 97*  CO2 22  --   --  27 30 29   GLUCOSE 135*  --   --  114* 101* 76  BUN 14  --   --  10 7* 7*  CREATININE 1.09*  --   --  0.74 0.72 0.64  CALCIUM 8.2*  --   --  7.9* 7.9* 8.4*  MG  --   --  2.1 1.9 1.7  2.4  PHOS  --   --   --   --  2.7 3.6    GFR: Estimated Creatinine Clearance: 56.4 mL/min (by C-G formula based on SCr of 0.64 mg/dL).  Liver Function Tests: Recent Labs  Lab 02/09/21 1636 02/10/21 0405 02/11/21 0155 02/12/21 0317  AST 34 23 13* 13*  ALT 13 13 11 9   ALKPHOS 68 64 57 65  BILITOT 0.4 0.7 0.5 0.6  PROT 6.2* 5.5* 5.6* 5.9*  ALBUMIN 3.2* 2.8* 2.7* 2.9*     Coagulation Profile: Recent Labs  Lab 02/09/21 1636  INR 1.0     Thyroid Function Tests: Recent Labs    02/13/21 0238  FREET4 0.79    Anemia Panel: Recent Labs    02/12/21 0317  VITAMINB12 182  FOLATE 9.8  FERRITIN 203  TIBC 248*  IRON 49  RETICCTPCT 1.8    Recent Results (from the past 240 hour(s))  Blood Culture (routine x 2)     Status: Abnormal   Collection Time: 02/09/21  4:36 PM   Specimen: BLOOD LEFT FOREARM  Result Value Ref Range Status   Specimen Description BLOOD LEFT FOREARM  Final   Special Requests   Final    BOTTLES DRAWN AEROBIC AND ANAEROBIC Blood Culture adequate volume   Culture  Setup Time   Final    GRAM POSITIVE COCCI IN CLUSTERS IN BOTH AEROBIC AND ANAEROBIC BOTTLES CRITICAL RESULT CALLED TO, READ BACK BY AND VERIFIED WITH: A PAYTES [id number] 02/10/21 A BROWNING    Culture (A)  Final    STAPHYLOCOCCUS HOMINIS THE SIGNIFICANCE OF ISOLATING THIS ORGANISM FROM A SINGLE SET OF BLOOD CULTURES WHEN MULTIPLE SETS ARE DRAWN IS UNCERTAIN. PLEASE NOTIFY THE MICROBIOLOGY DEPARTMENT WITHIN ONE WEEK IF SPECIATION AND SENSITIVITIES ARE REQUIRED. Performed at [facility], [place] [address] [place], [place] [address]    Report Status 02/12/2021 FINAL  Final  Resp Panel by RT-PCR (Flu A&B, Covid) Nasopharyngeal Swab      Status: None   Collection Time: 02/09/21  4:36 PM   Specimen: Nasopharyngeal Swab; Nasopharyngeal(NP) swabs in vial transport medium  Result Value Ref Range Status   SARS Coronavirus 2 by RT PCR NEGATIVE NEGATIVE Final    Comment: (NOTE) SARS-CoV-2 target nucleic acids are NOT DETECTED.  The SARS-CoV-2 RNA is generally detectable in upper respiratory specimens during the acute phase of infection. The lowest concentration of SARS-CoV-2 viral copies this assay can detect is 138 copies/mL. A negative result does not preclude SARS-Cov-2 infection and should not be used as the sole basis for treatment or other patient management decisions. A negative result may occur with  improper specimen collection/handling, submission of specimen other than nasopharyngeal swab, presence of viral mutation(s) within the areas targeted by this assay, and inadequate number of viral copies(<138 copies/mL). A negative result must be combined with clinical observations, patient history, and epidemiological information. The expected result is Negative.  Fact Sheet for Patients:  EntrepreneurPulse.com.au  Fact Sheet for Healthcare Providers:  IncredibleEmployment.be  This test is no t yet approved or cleared by the [place] FDA and  has been authorized for detection and/or diagnosis of SARS-CoV-2 by FDA under an Emergency Use Authorization (EUA). This EUA will remain  in effect (meaning this test can be used) for the duration of the COVID-19 declaration under Section 564(b)(1) of the Act, 21 U.S.C.section 360bbb-3(b)(1), unless the authorization is terminated  or revoked sooner.       Influenza A by PCR NEGATIVE NEGATIVE  Final   Influenza B by PCR NEGATIVE NEGATIVE Final    Comment: (NOTE) The Xpert Xpress SARS-CoV-2/FLU/RSV plus assay is intended as an aid in the diagnosis of influenza from Nasopharyngeal swab specimens and should not be used as a sole basis  for treatment. Nasal washings and aspirates are unacceptable for Xpert Xpress SARS-CoV-2/FLU/RSV testing.  Fact Sheet for Patients: EntrepreneurPulse.com.au  Fact Sheet for Healthcare Providers: IncredibleEmployment.be  This test is not yet approved or cleared by the [place] FDA and has been authorized for detection and/or diagnosis of SARS-CoV-2 by FDA under an Emergency Use Authorization (EUA). This EUA will remain in effect (meaning this test can be used) for the duration of the COVID-19 declaration under Section 564(b)(1) of the Act, 21 U.S.C. section 360bbb-3(b)(1), unless the authorization is terminated or revoked.  Performed at [facility], [place] [address], [place], [place] [address]   Blood Culture ID Panel (Reflexed)     Status: Abnormal   Collection Time: 02/09/21  4:36 PM  Result Value Ref Range Status   Enterococcus faecalis NOT DETECTED NOT DETECTED Final   Enterococcus Faecium NOT DETECTED NOT DETECTED Final   Listeria monocytogenes NOT DETECTED NOT DETECTED Final   Staphylococcus species DETECTED (A) NOT DETECTED Final    Comment: CRITICAL RESULT CALLED TO, READ BACK BY AND VERIFIED WITH: A PAYTES PHARMD 1425 02/10/21 A BROWNING    Staphylococcus aureus (BCID) NOT DETECTED NOT DETECTED Final   Staphylococcus epidermidis NOT DETECTED NOT DETECTED Final   Staphylococcus lugdunensis NOT DETECTED NOT DETECTED Final   Streptococcus species NOT DETECTED NOT DETECTED Final   Streptococcus agalactiae NOT DETECTED NOT DETECTED Final   Streptococcus pneumoniae NOT DETECTED NOT DETECTED Final   Streptococcus pyogenes NOT DETECTED NOT DETECTED Final   A.calcoaceticus-baumannii NOT DETECTED NOT DETECTED Final   Bacteroides fragilis NOT DETECTED NOT DETECTED Final   Enterobacterales NOT DETECTED NOT DETECTED Final   Enterobacter cloacae complex NOT DETECTED NOT DETECTED Final   Escherichia coli NOT DETECTED NOT DETECTED Final    Klebsiella aerogenes NOT DETECTED NOT DETECTED Final   Klebsiella oxytoca NOT DETECTED NOT DETECTED Final   Klebsiella pneumoniae NOT DETECTED NOT DETECTED Final   Proteus species NOT DETECTED NOT DETECTED Final   Salmonella species NOT DETECTED NOT DETECTED Final   Serratia marcescens NOT DETECTED NOT DETECTED Final   Haemophilus influenzae NOT DETECTED NOT DETECTED Final   Neisseria meningitidis NOT DETECTED NOT DETECTED Final   Pseudomonas aeruginosa NOT DETECTED NOT DETECTED Final   Stenotrophomonas maltophilia NOT DETECTED NOT DETECTED Final   Candida albicans NOT DETECTED NOT DETECTED Final   Candida auris NOT DETECTED NOT DETECTED Final   Candida glabrata NOT DETECTED NOT DETECTED Final   Candida krusei NOT DETECTED NOT DETECTED Final   Candida parapsilosis NOT DETECTED NOT DETECTED Final   Candida tropicalis NOT DETECTED NOT DETECTED Final   Cryptococcus neoformans/gattii NOT DETECTED NOT DETECTED Final    Comment: Performed at [facility], [address] [place], [place] [address]  Blood Culture (routine x 2)     Status: None (Preliminary result)   Collection Time: 02/09/21  7:35 PM   Specimen: BLOOD  Result Value Ref Range Status   Specimen Description BLOOD SITE NOT SPECIFIED  Final   Special Requests   Final    BOTTLES DRAWN AEROBIC ONLY Blood Culture adequate volume   Culture   Final    NO GROWTH 4 DAYS Performed at [facility], [place] [address],  [place], [place] [address]    Report Status PENDING  Incomplete  Urine culture     Status: Abnormal   Collection Time: 02/09/21  9:24 PM   Specimen: In/Out Cath Urine  Result Value Ref Range Status   Specimen Description IN/OUT CATH URINE  Final   Special Requests   Final    NONE Performed at [facility], [place] [address], [place], [place] [address]    Culture >=100,000 COLONIES/mL ESCHERICHIA COLI (A)  Final   Report Status 02/12/2021 FINAL  Final   Organism ID, Bacteria ESCHERICHIA COLI (A)  Final       Susceptibility   Escherichia coli - MIC*    AMPICILLIN 8 SENSITIVE Sensitive     CEFAZOLIN <=4 SENSITIVE Sensitive     CEFEPIME <=0.12 SENSITIVE Sensitive     CEFTRIAXONE <=0.25 SENSITIVE Sensitive     CIPROFLOXACIN <=0.25 SENSITIVE Sensitive     GENTAMICIN <=1 SENSITIVE Sensitive     IMIPENEM <=0.25 SENSITIVE Sensitive     NITROFURANTOIN 32 SENSITIVE Sensitive     TRIMETH/SULFA <=20 SENSITIVE Sensitive     AMPICILLIN/SULBACTAM 4 SENSITIVE Sensitive     PIP/TAZO <=4 SENSITIVE Sensitive     * >=100,000 COLONIES/mL ESCHERICHIA COLI      Radiology Studies: CT HEAD WO CONTRAST  Result Date: 02/11/2021 CLINICAL DATA:  Encephalopathy EXAM: CT HEAD WITHOUT CONTRAST TECHNIQUE: Contiguous axial images were obtained from the base of the skull through the vertex without intravenous contrast. COMPARISON:  01/28/2021 FINDINGS: Brain: There is no mass, hemorrhage or extra-axial collection. The size and configuration of the ventricles and extra-axial CSF spaces are normal. There is hypoattenuation of the white matter, most commonly indicating chronic small vessel disease. Vascular: Atherosclerotic calcification of the internal carotid arteries at the skull base. No abnormal hyperdensity of the major intracranial arteries or dural venous sinuses. Skull: The visualized skull base, calvarium and extracranial soft tissues are normal. Sinuses/Orbits: No fluid levels or advanced mucosal thickening of the visualized paranasal sinuses. No mastoid or middle ear effusion. The orbits are normal. IMPRESSION: Chronic small vessel disease without acute intracranial abnormality. Electronically Signed   By: Ulyses Jarred M.D.   On: 02/11/2021 21:54   CT HIP LEFT WO CONTRAST  Result Date: 02/12/2021 CLINICAL DATA:  Left hip pain after fall on 02/08/2021 EXAM: CT OF THE LEFT HIP WITHOUT CONTRAST TECHNIQUE: Multidetector CT imaging of the left hip was performed according to the standard protocol. Multiplanar CT image  reconstructions were also generated. COMPARISON:  X-ray 02/10/2021 FINDINGS: Bones/Joint/Cartilage No acute fracture. No dislocation. Mild left hip joint space narrowing. No appreciable hip joint effusion. Pubic symphysis and visualized portion of the left sacroiliac joint are intact without diastasis. Osseous structures appear slightly demineralized. No suspicious lytic or sclerotic bone lesion. Ligaments Suboptimally assessed by CT. Muscles and Tendons No acute musculotendinous abnormality by CT. Soft tissues No soft tissue fluid collection or hematoma. No left inguinal lymphadenopathy. Prominent atherosclerotic vascular calcifications. IMPRESSION: 1. No acute fracture or dislocation of the left hip. 2. Mild left hip osteoarthritis. Electronically Signed   By: Davina Poke D.O.   On: 02/12/2021 08:09       LOS: 4 days   [facility] Pager on [email or web address]  02/13/2021, 10:17 AM

## 2021-02-14 DIAGNOSIS — M25552 Pain in left hip: Secondary | ICD-10-CM | POA: Diagnosis not present

## 2021-02-14 DIAGNOSIS — N3000 Acute cystitis without hematuria: Secondary | ICD-10-CM | POA: Diagnosis not present

## 2021-02-14 LAB — CULTURE, BLOOD (ROUTINE X 2)
Culture: NO GROWTH
Special Requests: ADEQUATE

## 2021-02-14 LAB — BASIC METABOLIC PANEL
Anion gap: 11 (ref 5–15)
BUN: 7 mg/dL — ABNORMAL LOW (ref 8–23)
CO2: 33 mmol/L — ABNORMAL HIGH (ref 22–32)
Calcium: 9.1 mg/dL (ref 8.9–10.3)
Chloride: 96 mmol/L — ABNORMAL LOW (ref 98–111)
Creatinine, Ser: 0.78 mg/dL (ref 0.44–1.00)
GFR, Estimated: >60 mL/min (ref >60)
Glucose, Bld: 99 mg/dL (ref 70–99)
Potassium: 4.9 mmol/L (ref 3.5–5.1)
Sodium: 140 mmol/L (ref 135–145)

## 2021-02-14 LAB — CBC
HCT: 36.4 % (ref 36.0–46.0)
Hemoglobin: 11.4 g/dL — ABNORMAL LOW (ref 12.0–15.0)
MCH: 30.2 pg (ref 26.0–34.0)
MCHC: 31.3 g/dL (ref 30.0–36.0)
MCV: 96.3 fL (ref 80.0–100.0)
Platelets: 366 10*3/uL (ref 150–400)
RBC: 3.78 MIL/uL — ABNORMAL LOW (ref 3.87–5.11)
RDW: 11.9 % (ref 11.5–15.5)
WBC: 8.7 10*3/uL (ref 4.0–10.5)
nRBC: 0 % (ref 0.0–0.2)

## 2021-02-14 MED ORDER — METHOCARBAMOL 500 MG PO TABS
750.0000 mg | ORAL_TABLET | Freq: Three times a day (TID) | ORAL | Status: DC
  Administered 2021-02-14 – 2021-02-21 (×23): 750 mg via ORAL
  Filled 2021-02-14 (×25): qty 2

## 2021-02-14 MED ORDER — METHOCARBAMOL 500 MG PO TABS
500.0000 mg | ORAL_TABLET | Freq: Four times a day (QID) | ORAL | Status: DC | PRN
  Administered 2021-02-16: 500 mg via ORAL
  Filled 2021-02-14: qty 1

## 2021-02-14 NOTE — Progress Notes (Signed)
[facility] PROGRESS NOTE   Denise King GEX:[id number] DOB: [birth year] DOA: 02/09/2021  PCP: Biagio Borg, MD  Brief History/Interval Summary: 70 y.o.femalewith medical history significant forchronic hypoxic respiratory failure on 2 L continuous supplemental oxygen, COPD, chronic pain syndrome on chronic opioid therapy,chronic diastolic heart failure, depression,who was admitted to [facility] on2/13/2022 with altered mental status.  Thought to be secondary to UTI.   Consultants: None  Procedures: None  Antibiotics: Anti-infectives (From admission, onward)   Start     Dose/Rate Route Frequency Ordered Stop   02/12/21 1400  cephALEXin (KEFLEX) capsule 500 mg        500 mg Oral Every 8 hours 02/12/21 0908 02/15/21 2359   02/10/21 2000  cefTRIAXone (ROCEPHIN) 1 g in sodium chloride 0.9 % 100 mL IVPB  Status:  Discontinued        1 g 200 mL/hr over 30 Minutes Intravenous Every 24 hours 02/09/21 2324 02/12/21 0908   02/09/21 1830  cefTRIAXone (ROCEPHIN) 1 g in sodium chloride 0.9 % 100 mL IVPB        1 g 200 mL/hr over 30 Minutes Intravenous  Once 02/09/21 1817 02/09/21 2017       Subjective/Interval History: Overall patient feels better.  Continues to have some muscle pain in the left thigh area towards the back.  Denies any chest pain shortness of breath nausea or vomiting.     Assessment/Plan:  Acute metabolic encephalopathy Most likely secondary to urinary tract infection.  CT scan of the head did not show any acute findings.  Some outpatient medications were held.  Mentation is back to baseline. TSH noted to be 0.175.  Free T4 is noted to be normal at 0.79.  Likely sick euthyroid.    E. coli urinary tract infection with sepsis present on admission/staph hominis bacteremia Seems to be improving.  Sensitivities reviewed.  Changed to Keflex.  We will plan for a 7-day course. Staph hominis noted on blood cultures.  Likely contaminant.  Left  hip/posterior thigh pain No clear etiology for same.  No obvious lesions noted on examination.  CT hip did not show any fractures.  The pain is mostly in the muscle area posteriorly.  This did cover the area of concern and did not show any abnormalities.  Straight leg raising test was negative.  She was started on Robaxin yesterday.  PT and OT is working with her.  Will need short-term rehab.   If patient's symptoms do not improve in the next 2 to 3 weeks consideration may have to be given to doing MRI of the area of concern.  Chronic hypoxic respiratory failure/history of COPD On 2 L of oxygen at home.  Respiratory status seems to be stable at this time.  Acute kidney injury Renal function seems to have improved.  History of GERD PPI  History of depression Has been taking Wellbutrin and Zoloft for at least 6 months.   Cymbalta was resumed by PCP about 8 days prior to admission.  Cymbalta was subsequently discontinued.   Wellbutrin and Zoloft were resumed.  Normocytic anemia Hemoglobin is stable.  No evidence of overt bleeding.  Chronic diastolic CHF Most recent echocardiogram done last July showed normal systolic function.  Appears to be reasonably compensated at this time.    Chronic tobacco abuse She was counseled.  Chronic pain syndrome Patient noted to be on MS Contin which is being continued.  PDMP database was reviewed.  Patient also on oxycodone 10 mg.  Was also on buprenorphine patch but apparently not taking it anymore.  History of lymphocytic colitis On budesonide.  Follows with Dr. Carlean Purl with gastroenterology.   DVT Prophylaxis:   SCDs Code Status: Full code Family Communication: Discussed with the patient Disposition Plan: Waiting on skilled nursing facility placement for short-term rehab.  Status is: Inpatient  Remains inpatient appropriate because:Ongoing active pain requiring inpatient pain management and Altered mental status   Dispo: The patient is  from: Home              Anticipated d/c is to: SNF              Anticipated d/c date is: 2 days              Patient currently is not medically stable to d/c.   Difficult to place patient No       Medications:  Scheduled: . budesonide  9 mg Oral Daily  . buPROPion  150 mg Oral Daily  . cephALEXin  500 mg Oral Q8H  . methocarbamol  500 mg Oral TID  . morphine  15 mg Oral TID  . pantoprazole  40 mg Oral BID  . sertraline  200 mg Oral QHS  . vitamin B-12  1,000 mcg Oral Daily   Continuous:  PXT:GGYIRSWNIOEVO **OR** acetaminophen, alum & mag hydroxide-simeth, melatonin, nicotine, ondansetron (ZOFRAN) IV, oxyCODONE   Objective:  Vital Signs  Vitals:   02/13/21 0500 02/13/21 0750 02/13/21 2110 02/14/21 0815  BP:  (!) 142/75 132/77 116/60  Pulse:   74   Resp:  18 18 19   Temp:  98.3 F (36.8 C) 98.7 F (37.1 C) 98.5 F (36.9 C)  TempSrc:  Oral Oral Oral  SpO2:  92% 92% 93%  Weight: 56.2 kg     Height:        Intake/Output Summary (Last 24 hours) at 02/14/2021 0939 Last data filed at 02/13/2021 2100 Gross per 24 hour  Intake --  Output 700 ml  Net -700 ml   Filed Weights   02/13/21 0500  Weight: 56.2 kg    General appearance: Awake alert.  In no distress Resp: Clear to auscultation bilaterally.  Normal effort Cardio: S1-S2 is normal regular.  No S3-S4.  No rubs murmurs or bruit GI: Abdomen is soft.  Nontender nondistended.  Bowel sounds are present normal.  No masses organomegaly Extremities: No edema.  Passive range of motion of hip and knees is normal.  Straight leg raising test was negative. Neurologic: Alert and oriented x3.  No focal neurological deficits.      Lab Results:  Data Reviewed: I have personally reviewed following labs and imaging studies  CBC: Recent Labs  Lab 02/09/21 1636 02/09/21 1704 02/10/21 0405 02/11/21 0155 02/12/21 0317 02/14/21 0429  WBC 10.7*  --  11.9* 10.6* 9.2 8.7  NEUTROABS 8.4*  --  8.7* 6.4 5.7  --   HGB 10.7*  11.6* 10.5* 10.1* 11.6* 11.4*  HCT 34.8* 34.0* 33.6* 30.5* 36.5 36.4  MCV 98.0  --  98.2 95.0 95.1 96.3  PLT 313  --  292 307 350 350    Basic Metabolic Panel: Recent Labs  Lab 02/09/21 1636 02/09/21 1704 02/09/21 2325 02/10/21 0405 02/11/21 0155 02/12/21 0317 02/14/21 0429  NA 134* 134*  --  136 136 137 140  K 4.3 4.2  --  4.4 3.8 4.1 4.9  CL 100  --   --  100 99 97* 96*  CO2 22  --   --  27 30 29  33*  GLUCOSE 135*  --   --  114* 101* 76 99  BUN 14  --   --  10 7* 7* 7*  CREATININE 1.09*  --   --  0.74 0.72 0.64 0.78  CALCIUM 8.2*  --   --  7.9* 7.9* 8.4* 9.1  MG  --   --  2.1 1.9 1.7 2.4  --   PHOS  --   --   --   --  2.7 3.6  --     GFR: Estimated Creatinine Clearance: 56.4 mL/min (by C-G formula based on SCr of 0.78 mg/dL).  Liver Function Tests: Recent Labs  Lab 02/09/21 1636 02/10/21 0405 02/11/21 0155 02/12/21 0317  AST 34 23 13* 13*  ALT 13 13 11 9   ALKPHOS 68 64 57 65  BILITOT 0.4 0.7 0.5 0.6  PROT 6.2* 5.5* 5.6* 5.9*  ALBUMIN 3.2* 2.8* 2.7* 2.9*     Coagulation Profile: Recent Labs  Lab 02/09/21 1636  INR 1.0     Thyroid Function Tests: Recent Labs    02/13/21 0238  FREET4 0.79    Anemia Panel: Recent Labs    02/12/21 0317  VITAMINB12 182  FOLATE 9.8  FERRITIN 203  TIBC 248*  IRON 49  RETICCTPCT 1.8    Recent Results (from the past 240 hour(s))  Blood Culture (routine x 2)     Status: Abnormal   Collection Time: 02/09/21  4:36 PM   Specimen: BLOOD LEFT FOREARM  Result Value Ref Range Status   Specimen Description BLOOD LEFT FOREARM  Final   Special Requests   Final    BOTTLES DRAWN AEROBIC AND ANAEROBIC Blood Culture adequate volume   Culture  Setup Time   Final    GRAM POSITIVE COCCI IN CLUSTERS IN BOTH AEROBIC AND ANAEROBIC BOTTLES CRITICAL RESULT CALLED TO, READ BACK BY AND VERIFIED WITH: A PAYTES [id number] 02/10/21 A BROWNING    Culture (A)  Final    STAPHYLOCOCCUS HOMINIS THE SIGNIFICANCE OF ISOLATING THIS  ORGANISM FROM A SINGLE SET OF BLOOD CULTURES WHEN MULTIPLE SETS ARE DRAWN IS UNCERTAIN. PLEASE NOTIFY THE MICROBIOLOGY DEPARTMENT WITHIN ONE WEEK IF SPECIATION AND SENSITIVITIES ARE REQUIRED. Performed at [facility], [place] [address], [place], [place] [address]    Report Status 02/12/2021 FINAL  Final  Resp Panel by RT-PCR (Flu A&B, Covid) Nasopharyngeal Swab     Status: None   Collection Time: 02/09/21  4:36 PM   Specimen: Nasopharyngeal Swab; Nasopharyngeal(NP) swabs in vial transport medium  Result Value Ref Range Status   SARS Coronavirus 2 by RT PCR NEGATIVE NEGATIVE Final    Comment: (NOTE) SARS-CoV-2 target nucleic acids are NOT DETECTED.  The SARS-CoV-2 RNA is generally detectable in upper respiratory specimens during the acute phase of infection. The lowest concentration of SARS-CoV-2 viral copies this assay can detect is 138 copies/mL. A negative result does not preclude SARS-Cov-2 infection and should not be used as the sole basis for treatment or other patient management decisions. A negative result may occur with  improper specimen collection/handling, submission of specimen other than nasopharyngeal swab, presence of viral mutation(s) within the areas targeted by this assay, and inadequate number of viral copies(<138 copies/mL). A negative result must be combined with clinical observations, patient history, and epidemiological information. The expected result is Negative.  Fact Sheet for Patients:  EntrepreneurPulse.com.au  Fact Sheet for Healthcare Providers:  IncredibleEmployment.be  This test is no t yet approved or cleared by  the [employer] and  has been authorized for detection and/or diagnosis of SARS-CoV-2 by FDA under an Emergency Use Authorization (EUA). This EUA will remain  in effect (meaning this test can be used) for the duration of the COVID-19 declaration under Section 564(b)(1) of the Act,  21 U.S.C.section 360bbb-3(b)(1), unless the authorization is terminated  or revoked sooner.       Influenza A by PCR NEGATIVE NEGATIVE Final   Influenza B by PCR NEGATIVE NEGATIVE Final    Comment: (NOTE) The Xpert Xpress SARS-CoV-2/FLU/RSV plus assay is intended as an aid in the diagnosis of influenza from Nasopharyngeal swab specimens and should not be used as a sole basis for treatment. Nasal washings and aspirates are unacceptable for Xpert Xpress SARS-CoV-2/FLU/RSV testing.  Fact Sheet for Patients: EntrepreneurPulse.com.au  Fact Sheet for Healthcare Providers: IncredibleEmployment.be  This test is not yet approved or cleared by the [place] FDA and has been authorized for detection and/or diagnosis of SARS-CoV-2 by FDA under an Emergency Use Authorization (EUA). This EUA will remain in effect (meaning this test can be used) for the duration of the COVID-19 declaration under Section 564(b)(1) of the Act, 21 U.S.C. section 360bbb-3(b)(1), unless the authorization is terminated or revoked.  Performed at [facility], [place] [address], [place], [place] [address]   Blood Culture ID Panel (Reflexed)     Status: Abnormal   Collection Time: 02/09/21  4:36 PM  Result Value Ref Range Status   Enterococcus faecalis NOT DETECTED NOT DETECTED Final   Enterococcus Faecium NOT DETECTED NOT DETECTED Final   Listeria monocytogenes NOT DETECTED NOT DETECTED Final   Staphylococcus species DETECTED (A) NOT DETECTED Final    Comment: CRITICAL RESULT CALLED TO, READ BACK BY AND VERIFIED WITH: A PAYTES PHARMD 1425 02/10/21 A BROWNING    Staphylococcus aureus (BCID) NOT DETECTED NOT DETECTED Final   Staphylococcus epidermidis NOT DETECTED NOT DETECTED Final   Staphylococcus lugdunensis NOT DETECTED NOT DETECTED Final   Streptococcus species NOT DETECTED NOT DETECTED Final   Streptococcus agalactiae NOT DETECTED NOT DETECTED Final   Streptococcus  pneumoniae NOT DETECTED NOT DETECTED Final   Streptococcus pyogenes NOT DETECTED NOT DETECTED Final   A.calcoaceticus-baumannii NOT DETECTED NOT DETECTED Final   Bacteroides fragilis NOT DETECTED NOT DETECTED Final   Enterobacterales NOT DETECTED NOT DETECTED Final   Enterobacter cloacae complex NOT DETECTED NOT DETECTED Final   Escherichia coli NOT DETECTED NOT DETECTED Final   Klebsiella aerogenes NOT DETECTED NOT DETECTED Final   Klebsiella oxytoca NOT DETECTED NOT DETECTED Final   Klebsiella pneumoniae NOT DETECTED NOT DETECTED Final   Proteus species NOT DETECTED NOT DETECTED Final   Salmonella species NOT DETECTED NOT DETECTED Final   Serratia marcescens NOT DETECTED NOT DETECTED Final   Haemophilus influenzae NOT DETECTED NOT DETECTED Final   Neisseria meningitidis NOT DETECTED NOT DETECTED Final   Pseudomonas aeruginosa NOT DETECTED NOT DETECTED Final   Stenotrophomonas maltophilia NOT DETECTED NOT DETECTED Final   Candida albicans NOT DETECTED NOT DETECTED Final   Candida auris NOT DETECTED NOT DETECTED Final   Candida glabrata NOT DETECTED NOT DETECTED Final   Candida krusei NOT DETECTED NOT DETECTED Final   Candida parapsilosis NOT DETECTED NOT DETECTED Final   Candida tropicalis NOT DETECTED NOT DETECTED Final   Cryptococcus neoformans/gattii NOT DETECTED NOT DETECTED Final    Comment: Performed at [facility], [address], [place], [place] [address]  Blood Culture (routine x 2)  Status: None (Preliminary result)   Collection Time: 02/09/21  7:35 PM   Specimen: BLOOD  Result Value Ref Range Status   Specimen Description BLOOD SITE NOT SPECIFIED  Final   Special Requests   Final    BOTTLES DRAWN AEROBIC ONLY Blood Culture adequate volume   Culture   Final    NO GROWTH 4 DAYS Performed at [facility], [address] [place], [place] [address]    Report Status PENDING  Incomplete  Urine culture     Status: Abnormal   Collection Time: 02/09/21  9:24  PM   Specimen: In/Out Cath Urine  Result Value Ref Range Status   Specimen Description IN/OUT CATH URINE  Final   Special Requests   Final    NONE Performed at [facility], [place] [address], [place],  [address]    Culture >=100,000 COLONIES/mL ESCHERICHIA COLI (A)  Final   Report Status 02/12/2021 FINAL  Final   Organism ID, Bacteria ESCHERICHIA COLI (A)  Final      Susceptibility   Escherichia coli - MIC*    AMPICILLIN 8 SENSITIVE Sensitive     CEFAZOLIN <=4 SENSITIVE Sensitive     CEFEPIME <=0.12 SENSITIVE Sensitive     CEFTRIAXONE <=0.25 SENSITIVE Sensitive     CIPROFLOXACIN <=0.25 SENSITIVE Sensitive     GENTAMICIN <=1 SENSITIVE Sensitive     IMIPENEM <=0.25 SENSITIVE Sensitive     NITROFURANTOIN 32 SENSITIVE Sensitive     TRIMETH/SULFA <=20 SENSITIVE Sensitive     AMPICILLIN/SULBACTAM 4 SENSITIVE Sensitive     PIP/TAZO <=4 SENSITIVE Sensitive     * >=100,000 COLONIES/mL ESCHERICHIA COLI      Radiology Studies: No results found.     LOS: 5 days   Prabhleen Montemayor [employer] on [email or web address]  02/14/2021, 9:39 AM

## 2021-02-14 NOTE — Progress Notes (Signed)
Occupational Therapy Treatment Patient Details Name: Denise King MRN: [id number] DOB: 12-Feb-1951 Today's Date: 02/14/2021    History of present illness 70 y.o. female with medical history significant for chronic hypoxic respiratory failure on 2 L continuous supplemental oxygen, COPD, chronic pain syndrome on chronic opioid therapy , chronic diastolic heart failure, depression, who is admitted to [facility] on 02/09/2021 with suspected acute metabolic encephalopathy. Patient reported fall on 2/12 (tripped on doorframe) landing on left hip. Xray and CT hip negative for fracture. +hit her head but denied LOC; severe sepsis due to UTI   OT comments  Pt progressing towards OT goals, remains limited by pain but determined to participate and increase independence. Guided pt in AAROM of L LE prior to activity with muscle spasms visibly noted. Pt able to demo improved WB and standing tolerance at sink during ADLs today. Pt with urine incontinence standing at sink, able to assist in LB bathing and dressing tasks for clean up with Mod A at most. Pt continues to require Min A for sit to stand transfers using RW to gain balance but gradually improving. VSS on 2 L O2. Noted insurance challenges per CIR consult, so updated DC recs to SNF for postacute rehab.   Follow Up Recommendations  SNF    Equipment Recommendations  3 in 1 bedside commode    Recommendations for Other Services      Precautions / Restrictions Precautions Precautions: Fall Precaution Comments: 3 falls in 3 years, monitor O2 Restrictions Weight Bearing Restrictions: No       Mobility Bed Mobility Overal bed mobility: Needs Assistance Bed Mobility: Supine to Sit;Sit to Supine     Supine to sit: Min guard Sit to supine: Min assist   General bed mobility comments: assist to raise legs onto bed  Transfers Overall transfer level: Needs assistance Equipment used: Rolling walker (2 wheeled) Transfers: Sit to/from  Stand Sit to Stand: Min assist         General transfer comment: Min A for power up and to gain balance x 3-4 during session. improved throughout session    Balance Overall balance assessment: History of Falls;Needs assistance Sitting-balance support: Single extremity supported;Feet supported Sitting balance-Leahy Scale: Fair Sitting balance - Comments: tendency to lean to R to offload painful L hip   Standing balance support: Single extremity supported;During functional activity;Bilateral upper extremity supported Standing balance-Leahy Scale: Poor Standing balance comment: reliant on at least one UE support                           ADL either performed or assessed with clinical judgement   ADL Overall ADL's : Needs assistance/impaired     Grooming: Min guard;Standing;Oral care Grooming Details (indicate cue type and reason): min guard for oral care standing at sink, still painful but pt able to stand for duration of task with improved WB through L LE     Lower Body Bathing: Minimal assistance;Sit to/from stand Lower Body Bathing Details (indicate cue type and reason): Min A for bathing LB after urine incontinence in standing, assist needed for balance in standing Upper Body Dressing : Set up;Sitting Upper Body Dressing Details (indicate cue type and reason): Setup to don clean gown Lower Body Dressing: Moderate assistance;Sit to/from stand;Sitting/lateral leans Lower Body Dressing Details (indicate cue type and reason): Overall improved, able to doff underwear, socks after urine incontinence. More assist needed for R LE (hx of hip surgery) than L LE today.  Toileting - Clothing Manipulation Details (indicate cue type and reason): urine incontinence during session     Functional mobility during ADLs: Minimal assistance;Rolling walker General ADL Comments: Continues with limitations due to pain but determined to improve functional abilities and able to  improve WB through L LE     Vision   Vision Assessment?: No apparent visual deficits   Perception     Praxis      Cognition Arousal/Alertness: Awake/alert Behavior During Therapy: WFL for tasks assessed/performed Overall Cognitive Status: Within Functional Limits for tasks assessed                                 General Comments: A&OX4. cognition at baseline        Exercises     Shoulder Instructions       General Comments SpO2 WFL on 2 L O2. Muscle spasms visible with lifting LE and bending knee. Assisted with AAROM of L LE for knee flexion in bed and sitting EOB to attempt in relaxing muscles/prep for activity. Assisted to side lying for pain mgmt    Pertinent Vitals/ Pain       Pain Assessment: 0-10 Pain Score: 8  Pain Location: left hip Pain Descriptors / Indicators: Aching;Grimacing;Guarding;Cramping Pain Intervention(s): Monitored during session;Limited activity within patient's tolerance;Premedicated before session;Repositioned  Home Living                                          Prior Functioning/Environment              Frequency  Min 2X/week        Progress Toward Goals  OT Goals(current goals can now be found in the care plan section)  Progress towards OT goals: Progressing toward goals  Acute Rehab OT Goals Patient Stated Goal: to return home to husband OT Goal Formulation: With patient Time For Goal Achievement: 02/26/21 Potential to Achieve Goals: Good ADL Goals Pt Will Perform Grooming: with modified independence;standing Pt Will Perform Lower Body Bathing: with modified independence;sitting/lateral leans;sit to/from stand Pt Will Perform Lower Body Dressing: with modified independence;sitting/lateral leans;sit to/from stand;with adaptive equipment Pt Will Transfer to Toilet: with modified independence;ambulating Pt Will Perform Toileting - Clothing Manipulation and hygiene: with modified  independence;sitting/lateral leans;sit to/from stand  Plan Discharge plan needs to be updated    Co-evaluation                 AM-PAC OT "6 Clicks" Daily Activity     Outcome Measure   Help from another person eating meals?: None Help from another person taking care of personal grooming?: A Little Help from another person toileting, which includes using toliet, bedpan, or urinal?: A Lot Help from another person bathing (including washing, rinsing, drying)?: A Little Help from another person to put on and taking off regular upper body clothing?: A Little Help from another person to put on and taking off regular lower body clothing?: A Little 6 Click Score: 18    End of Session Equipment Utilized During Treatment: Gait belt;Rolling walker;Oxygen  OT Visit Diagnosis: Unsteadiness on feet (R26.81);Other abnormalities of gait and mobility (R26.89);Muscle weakness (generalized) (M62.81);Pain Pain - Right/Left: Left Pain - part of body: Leg;Hip (and back)   Activity Tolerance Patient tolerated treatment well;Patient limited by pain   Patient Left in bed;with call bell/phone within  reach;with bed alarm set   Nurse Communication          Time: [phone] OT Time Calculation (min): 41 min  Charges: OT General Charges $OT Visit: 1 Visit OT Treatments $Self Care/Home Management : 38-52 mins  Malachy Chamber, OTR/L Acute Rehab Services Office: [phone]   Layla Maw 02/14/2021, 11:27 AM

## 2021-02-14 NOTE — TOC Progression Note (Signed)
Transition of Care [facility]) - Progression Note    Patient Details  Name: LORAH KALINA MRN: [id number] Date of Birth: May 23, 1951  Transition of Care [facility]) CM/SW [place], RN Phone Number: [phone]  02/14/2021, 3:12 PM  Clinical Narrative:    CM following up on bed offers. Currently no bed offers in the HUB. Several facilities are out of network for insurance. CM has attempted to follow up with [facility], [facility], [facility] with no response.    Expected Discharge Plan: [place] Barriers to Discharge: Continued Medical Work up  Expected Discharge Plan and Services Expected Discharge Plan: [place] In-house Referral: NA Discharge Planning Services: CM Consult Post Acute Care Choice: [place] Living arrangements for the past 2 months: Single Family Home                 DME Arranged: N/A DME Agency: NA       HH Arranged: Patient Refused [place] Agency: NA         Social Determinants of Health (SDOH) Interventions    Readmission Risk Interventions Readmission Risk Prevention Plan 07/10/2020  Transportation Screening Complete  PCP or Specialist Appt within 3-5 Days Complete  HRI or [place] Complete  Social Work Consult for [place] Planning/Counseling Complete  Palliative Care Screening Not Applicable  Some recent data might be hidden

## 2021-02-14 NOTE — Progress Notes (Signed)
Physical Therapy Treatment Patient Details Name: Denise King MRN: [id number] DOB: [birth year] Today's Date: 02/14/2021    History of Present Illness Pt is 70 y.o. female with medical history significant for chronic hypoxic respiratory failure on 2 L continuous supplemental oxygen, COPD, chronic pain syndrome on chronic opioid therapy , chronic diastolic heart failure, depression, who is admitted to [facility] on 02/09/2021 with suspected acute metabolic encephalopathy due to sepsis from UTI. Patient reported fall on 2/2 (tripped on doorframe) landing on left hip and was seen in ED. Xray and CT hip negative for fracture. +hit her head but denied .  She did have R foot fracture but reports she was told she could ambulate.    PT Comments    Pt making gradual progress with mobility; however, remains limited by L buttock/SI pain.  She has scolosis with history of back pain but reports this new pain began 4-5 days after her fall.  Pain starts L buttock/piriformis area and can shoot up to SI joint and over to R SI.  Pt felt some relief with ice and light piriformis stretch.  Also gave HEP of AAROM piriformis stretch and gentle pelvic tilts for core strengthening.  Continue to recommend SNF at discharge as pt would have difficulty performing household ambulation, transfers, and daily activities at this time.     Follow Up Recommendations  SNF     Equipment Recommendations  None recommended by PT    Recommendations for Other Services       Precautions / Restrictions Precautions Precautions: Fall Precaution Comments: 3 falls in 3 years, monitor O2 Restrictions Weight Bearing Restrictions: No    Mobility  Bed Mobility Overal bed mobility: Needs Assistance Bed Mobility: Supine to Sit;Sit to Supine     Supine to sit: Min guard;HOB elevated Sit to supine: Min guard;HOB elevated   General bed mobility comments: increased time due to pain    Transfers Overall transfer level:  Needs assistance Equipment used: Rolling walker (2 wheeled) Transfers: Sit to/from Stand Sit to Stand: Min assist         General transfer comment: Min A for power up with cues for hand placement  Ambulation/Gait Ambulation/Gait assistance: Min guard Gait Distance (Feet): 25 Feet Assistive device: Rolling walker (2 wheeled) Gait Pattern/deviations: Step-to pattern;Decreased stance time - left;Decreased stride length Gait velocity: decreased   General Gait Details: Step to L gait with use of RW to offload weight; cues for use of RW   Stairs             Wheelchair Mobility    Modified Rankin (Stroke Patients Only)       Balance Overall balance assessment: History of Falls;Needs assistance Sitting-balance support: No upper extremity supported Sitting balance-Leahy Scale: Fair Sitting balance - Comments: tendency to lean to R to offload painful L hip   Standing balance support: During functional activity;Bilateral upper extremity supported;Single extremity supported Standing balance-Leahy Scale: Poor Standing balance comment: reliant on at least one UE support                            Cognition Arousal/Alertness: Awake/alert Behavior During Therapy: WFL for tasks assessed/performed Overall Cognitive Status: Within Functional Limits for tasks assessed                                 General Comments: A&OX4. cognition at baseline  Exercises Other Exercises Other Exercises: Pelvic tilts with tactile cues x 10 Other Exercises: Light L piriformis stretch in supine with L knee toward R shoulder : PT assisted x 2 30 second hold then showed pt AAROM with gait belt and hands to assist.  Pt reports helped to relief some pain.    General Comments General comments (skin integrity, edema, etc.): Pt's VSS on 2 LO2 during therapy at 95%.  Able to walk to door and back on RA with sats 90%.  Pain in L buttock: Increased with L lateral L, L  rotation, and trunk flexion.  Trunk extension increased pt's chronic back pain.  Pt with scolosis and some chronic back pain but typically in upper lumbar/thoracic area.  Buttock pain also increased with L LE AROM including knee ext and hip flexion at EOB and with weight bearing.  Pt reports ice reliefs pain.  Also performed gentle piriformis stretch that pt reports improved pain - educated on AAROM stretch but to stop if pain increases.       Pertinent Vitals/Pain Pain Assessment: 0-10 Pain Score: 7  Pain Location: left hip Pain Descriptors / Indicators: Aching;Grimacing;Guarding;Cramping Pain Intervention(s): Monitored during session;Limited activity within patient's tolerance;Repositioned;Relaxation;Ice applied    Home Living                      Prior Function            PT Goals (current goals can now be found in the care plan section) Acute Rehab PT Goals Patient Stated Goal: to return home to husband PT Goal Formulation: With patient Time For Goal Achievement: 02/26/21 Potential to Achieve Goals: Good Progress towards PT goals: Progressing toward goals    Frequency    Min 3X/week      PT Plan Frequency needs to be updated    Co-evaluation              AM-PAC PT "6 Clicks" Mobility   Outcome Measure  Help needed turning from your back to your side while in a flat bed without using bedrails?: A Little Help needed moving from lying on your back to sitting on the side of a flat bed without using bedrails?: A Little Help needed moving to and from a bed to a chair (including a wheelchair)?: A Little Help needed standing up from a chair using your arms (e.g., wheelchair or bedside chair)?: A Little Help needed to walk in hospital room?: A Little Help needed climbing 3-5 steps with a railing? : A Lot 6 Click Score: 17    End of Session Equipment Utilized During Treatment: Oxygen;Gait belt Activity Tolerance: Patient limited by pain Patient left: in  bed;with call bell/phone within reach Nurse Communication: Mobility status PT Visit Diagnosis: Difficulty in walking, not elsewhere classified (R26.2);Pain Pain - Right/Left: Left Pain - part of body: Hip     Time: 1245-1310 PT Time Calculation (min) (ACUTE ONLY): 25 min  Charges:  $Gait Training: 8-22 mins $Therapeutic Exercise: 8-22 mins                     Abran Richard, PT Acute Rehab Services Pager [phone] [facility] [place] 02/14/2021, 2:16 PM

## 2021-02-15 DIAGNOSIS — M79605 Pain in left leg: Secondary | ICD-10-CM | POA: Diagnosis not present

## 2021-02-15 DIAGNOSIS — N3000 Acute cystitis without hematuria: Secondary | ICD-10-CM | POA: Diagnosis not present

## 2021-02-15 NOTE — Plan of Care (Signed)
  Problem: Education: Goal: Knowledge of General Education information will improve Description: Including pain rating scale, medication(s)/side effects and non-pharmacologic comfort measures Outcome: Not Progressing   Problem: Health Behavior/Discharge Planning: Goal: Ability to manage health-related needs will improve Outcome: Not Progressing   Problem: Clinical Measurements: Goal: Will remain free from infection Outcome: Not Progressing Goal: Diagnostic test results will improve Outcome: Not Progressing   Problem: Activity: Goal: Risk for activity intolerance will decrease Outcome: Not Progressing

## 2021-02-15 NOTE — Progress Notes (Signed)
[facility] PROGRESS NOTE   Denise King XLK:[id number] DOB: 1951/07/19 DOA: 02/09/2021  PCP: Biagio Borg, MD  Brief History/Interval Summary: 70 y.o.femalewith medical history significant forchronic hypoxic respiratory failure on 2 L continuous supplemental oxygen, COPD, chronic pain syndrome on chronic opioid therapy,chronic diastolic heart failure, depression,who was admitted to [facility] on2/13/2022 with altered mental status.  Thought to be secondary to UTI.   Consultants: None  Procedures: None  Antibiotics: Anti-infectives (From admission, onward)   Start     Dose/Rate Route Frequency Ordered Stop   02/12/21 1400  cephALEXin (KEFLEX) capsule 500 mg        500 mg Oral Every 8 hours 02/12/21 0908 02/15/21 2359   02/10/21 2000  cefTRIAXone (ROCEPHIN) 1 g in sodium chloride 0.9 % 100 mL IVPB  Status:  Discontinued        1 g 200 mL/hr over 30 Minutes Intravenous Every 24 hours 02/09/21 2324 02/12/21 0908   02/09/21 1830  cefTRIAXone (ROCEPHIN) 1 g in sodium chloride 0.9 % 100 mL IVPB        1 g 200 mL/hr over 30 Minutes Intravenous  Once 02/09/21 1817 02/09/21 2017       Subjective/Interval History: Patient states that the cramping in her left leg appears to be improving.  Denies any other complaints.  No shortness of breath or chest pain.  No nausea or vomiting.     Assessment/Plan:  Acute metabolic encephalopathy This has resolved.  Was most likely secondary to urinary tract infection.  CT scan of the head did not show any acute findings.   TSH noted to be 0.175.  Free T4 is noted to be normal at 0.79.  Likely sick euthyroid.    E. coli urinary tract infection with sepsis present on admission/staph hominis bacteremia Initially placed on ceftriaxone.  Based on sensitivities she was changed over to Keflex.  7-day treatment as planned.   Staph hominis noted on blood cultures.  Likely contaminant.  Left hip/posterior thigh pain No  clear etiology for same.  No obvious lesions noted on examination.  CT hip did not show any fractures.  The pain is mostly in the muscle area posteriorly.  This did cover the area of concern and did not show any abnormalities.  Straight leg raising test was negative.  Started on Robaxin.  Seems to be slightly better today.  Continue PT and OT.  Continue muscle relaxants and pain medications.   If patient's symptoms do not improve in the next 2 to 3 weeks consideration may have to be given to doing MRI of the area of concern.  Chronic hypoxic respiratory failure/history of COPD On 2 L of oxygen at home.  Respiratory status seems to be stable at this time.  Acute kidney injury Renal function seems to have improved.  History of GERD PPI  History of depression Has been taking Wellbutrin and Zoloft for at least 6 months.   Cymbalta was resumed by PCP about 8 days prior to admission.  Cymbalta was subsequently discontinued.   Wellbutrin and Zoloft were resumed.  Normocytic anemia Hemoglobin is stable.  No evidence of overt bleeding.  Chronic diastolic CHF Most recent echocardiogram done last July showed normal systolic function.  Appears to be reasonably compensated at this time.    Chronic tobacco abuse She was counseled.  Chronic pain syndrome Patient noted to be on MS Contin which is being continued.  PDMP database was reviewed.  Patient also on oxycodone 10  mg.  Was also on buprenorphine patch but apparently not taking it anymore.  History of lymphocytic colitis On budesonide.  Follows with Dr. Carlean Purl with gastroenterology.   DVT Prophylaxis:   SCDs Code Status: Full code Family Communication: Discussed with the patient Disposition Plan: Waiting on skilled nursing facility placement for short-term rehab.  Status is: Inpatient  Remains inpatient appropriate because:Ongoing active pain requiring inpatient pain management and Altered mental status   Dispo: The patient is from:  Home              Anticipated d/c is to: SNF              Anticipated d/c date is: 2 days              Patient currently is not medically stable to d/c.   Difficult to place patient No       Medications:  Scheduled: . budesonide  9 mg Oral Daily  . buPROPion  150 mg Oral Daily  . cephALEXin  500 mg Oral Q8H  . methocarbamol  750 mg Oral TID  . morphine  15 mg Oral TID  . pantoprazole  40 mg Oral BID  . sertraline  200 mg Oral QHS  . vitamin B-12  1,000 mcg Oral Daily   Continuous:  HER:DEYCXKGYJEHUD **OR** acetaminophen, alum & mag hydroxide-simeth, melatonin, methocarbamol, nicotine, ondansetron (ZOFRAN) IV, oxyCODONE   Objective:  Vital Signs  Vitals:   02/13/21 0750 02/13/21 2110 02/14/21 0815 02/15/21 0505  BP: (!) 142/75 132/77 116/60 128/67  Pulse:  74  68  Resp: 18 18 19 18   Temp: 98.3 F (36.8 C) 98.7 F (37.1 C) 98.5 F (36.9 C) (!) 97.5 F (36.4 C)  TempSrc: Oral Oral Oral Oral  SpO2: 92% 92% 93% 93%  Weight:      Height:        Intake/Output Summary (Last 24 hours) at 02/15/2021 1035 Last data filed at 02/14/2021 2211 Gross per 24 hour  Intake 120 ml  Output 650 ml  Net -530 ml   Filed Weights   02/13/21 0500  Weight: 56.2 kg    General appearance: Awake alert.  In no distress Resp: Clear to auscultation bilaterally.  Normal effort Cardio: S1-S2 is normal regular.  No S3-S4.  No rubs murmurs or bruit GI: Abdomen is soft.  Nontender nondistended.  Bowel sounds are present normal.  No masses organomegaly     Lab Results:  Data Reviewed: I have personally reviewed following labs and imaging studies  CBC: Recent Labs  Lab 02/09/21 1636 02/09/21 1704 02/10/21 0405 02/11/21 0155 02/12/21 0317 02/14/21 0429  WBC 10.7*  --  11.9* 10.6* 9.2 8.7  NEUTROABS 8.4*  --  8.7* 6.4 5.7  --   HGB 10.7* 11.6* 10.5* 10.1* 11.6* 11.4*  HCT 34.8* 34.0* 33.6* 30.5* 36.5 36.4  MCV 98.0  --  98.2 95.0 95.1 96.3  PLT 313  --  292 307 350 366     Basic Metabolic Panel: Recent Labs  Lab 02/09/21 1636 02/09/21 1704 02/09/21 2325 02/10/21 0405 02/11/21 0155 02/12/21 0317 02/14/21 0429  NA 134* 134*  --  136 136 137 140  K 4.3 4.2  --  4.4 3.8 4.1 4.9  CL 100  --   --  100 99 97* 96*  CO2 22  --   --  27 30 29  33*  GLUCOSE 135*  --   --  114* 101* 76 99  BUN 14  --   --  10 7* 7* 7*  CREATININE 1.09*  --   --  0.74 0.72 0.64 0.78  CALCIUM 8.2*  --   --  7.9* 7.9* 8.4* 9.1  MG  --   --  2.1 1.9 1.7 2.4  --   PHOS  --   --   --   --  2.7 3.6  --     GFR: Estimated Creatinine Clearance: 56.4 mL/min (by C-G formula based on SCr of 0.78 mg/dL).  Liver Function Tests: Recent Labs  Lab 02/09/21 1636 02/10/21 0405 02/11/21 0155 02/12/21 0317  AST 34 23 13* 13*  ALT 13 13 11 9   ALKPHOS 68 64 57 65  BILITOT 0.4 0.7 0.5 0.6  PROT 6.2* 5.5* 5.6* 5.9*  ALBUMIN 3.2* 2.8* 2.7* 2.9*     Coagulation Profile: Recent Labs  Lab 02/09/21 1636  INR 1.0     Thyroid Function Tests: Recent Labs    02/13/21 0238  FREET4 0.79      Recent Results (from the past 240 hour(s))  Blood Culture (routine x 2)     Status: Abnormal   Collection Time: 02/09/21  4:36 PM   Specimen: BLOOD LEFT FOREARM  Result Value Ref Range Status   Specimen Description BLOOD LEFT FOREARM  Final   Special Requests   Final    BOTTLES DRAWN AEROBIC AND ANAEROBIC Blood Culture adequate volume   Culture  Setup Time   Final    GRAM POSITIVE COCCI IN CLUSTERS IN BOTH AEROBIC AND ANAEROBIC BOTTLES CRITICAL RESULT CALLED TO, READ BACK BY AND VERIFIED WITH: A PAYTES [id number] 02/10/21 A BROWNING    Culture (A)  Final    STAPHYLOCOCCUS HOMINIS THE SIGNIFICANCE OF ISOLATING THIS ORGANISM FROM A SINGLE SET OF BLOOD CULTURES WHEN MULTIPLE SETS ARE DRAWN IS UNCERTAIN. PLEASE NOTIFY THE MICROBIOLOGY DEPARTMENT WITHIN ONE WEEK IF SPECIATION AND SENSITIVITIES ARE REQUIRED. Performed at [facility], [place] [address] [place], [place] [address]     Report Status 02/12/2021 FINAL  Final  Resp Panel by RT-PCR (Flu A&B, Covid) Nasopharyngeal Swab     Status: None   Collection Time: 02/09/21  4:36 PM   Specimen: Nasopharyngeal Swab; Nasopharyngeal(NP) swabs in vial transport medium  Result Value Ref Range Status   SARS Coronavirus 2 by RT PCR NEGATIVE NEGATIVE Final    Comment: (NOTE) SARS-CoV-2 target nucleic acids are NOT DETECTED.  The SARS-CoV-2 RNA is generally detectable in upper respiratory specimens during the acute phase of infection. The lowest concentration of SARS-CoV-2 viral copies this assay can detect is 138 copies/mL. A negative result does not preclude SARS-Cov-2 infection and should not be used as the sole basis for treatment or other patient management decisions. A negative result may occur with  improper specimen collection/handling, submission of specimen other than nasopharyngeal swab, presence of viral mutation(s) within the areas targeted by this assay, and inadequate number of viral copies(<138 copies/mL). A negative result must be combined with clinical observations, patient history, and epidemiological information. The expected result is Negative.  Fact Sheet for Patients:  EntrepreneurPulse.com.au  Fact Sheet for Healthcare Providers:  IncredibleEmployment.be  This test is no t yet approved or cleared by the [place] FDA and  has been authorized for detection and/or diagnosis of SARS-CoV-2 by FDA under an Emergency Use Authorization (EUA). This EUA will remain  in effect (meaning this test can be used) for the duration of the COVID-19 declaration under Section 564(b)(1) of the Act, 21 U.S.C.section 360bbb-3(b)(1), unless the  authorization is terminated  or revoked sooner.       Influenza A by PCR NEGATIVE NEGATIVE Final   Influenza B by PCR NEGATIVE NEGATIVE Final    Comment: (NOTE) The Xpert Xpress SARS-CoV-2/FLU/RSV plus assay is intended as an aid in  the diagnosis of influenza from Nasopharyngeal swab specimens and should not be used as a sole basis for treatment. Nasal washings and aspirates are unacceptable for Xpert Xpress SARS-CoV-2/FLU/RSV testing.  Fact Sheet for Patients: EntrepreneurPulse.com.au  Fact Sheet for Healthcare Providers: IncredibleEmployment.be  This test is not yet approved or cleared by the [place] FDA and has been authorized for detection and/or diagnosis of SARS-CoV-2 by FDA under an Emergency Use Authorization (EUA). This EUA will remain in effect (meaning this test can be used) for the duration of the COVID-19 declaration under Section 564(b)(1) of the Act, 21 U.S.C. section 360bbb-3(b)(1), unless the authorization is terminated or revoked.  Performed at [facility], [place] [address] [place], [place] [address]   Blood Culture ID Panel (Reflexed)     Status: Abnormal   Collection Time: 02/09/21  4:36 PM  Result Value Ref Range Status   Enterococcus faecalis NOT DETECTED NOT DETECTED Final   Enterococcus Faecium NOT DETECTED NOT DETECTED Final   Listeria monocytogenes NOT DETECTED NOT DETECTED Final   Staphylococcus species DETECTED (A) NOT DETECTED Final    Comment: CRITICAL RESULT CALLED TO, READ BACK BY AND VERIFIED WITH: A PAYTES PHARMD [id number] 02/10/21 A BROWNING    Staphylococcus aureus (BCID) NOT DETECTED NOT DETECTED Final   Staphylococcus epidermidis NOT DETECTED NOT DETECTED Final   Staphylococcus lugdunensis NOT DETECTED NOT DETECTED Final   Streptococcus species NOT DETECTED NOT DETECTED Final   Streptococcus agalactiae NOT DETECTED NOT DETECTED Final   Streptococcus pneumoniae NOT DETECTED NOT DETECTED Final   Streptococcus pyogenes NOT DETECTED NOT DETECTED Final   A.calcoaceticus-baumannii NOT DETECTED NOT DETECTED Final   Bacteroides fragilis NOT DETECTED NOT DETECTED Final   Enterobacterales NOT DETECTED NOT DETECTED Final   Enterobacter  cloacae complex NOT DETECTED NOT DETECTED Final   Escherichia coli NOT DETECTED NOT DETECTED Final   Klebsiella aerogenes NOT DETECTED NOT DETECTED Final   Klebsiella oxytoca NOT DETECTED NOT DETECTED Final   Klebsiella pneumoniae NOT DETECTED NOT DETECTED Final   Proteus species NOT DETECTED NOT DETECTED Final   Salmonella species NOT DETECTED NOT DETECTED Final   Serratia marcescens NOT DETECTED NOT DETECTED Final   Haemophilus influenzae NOT DETECTED NOT DETECTED Final   Neisseria meningitidis NOT DETECTED NOT DETECTED Final   Pseudomonas aeruginosa NOT DETECTED NOT DETECTED Final   Stenotrophomonas maltophilia NOT DETECTED NOT DETECTED Final   Candida albicans NOT DETECTED NOT DETECTED Final   Candida auris NOT DETECTED NOT DETECTED Final   Candida glabrata NOT DETECTED NOT DETECTED Final   Candida krusei NOT DETECTED NOT DETECTED Final   Candida parapsilosis NOT DETECTED NOT DETECTED Final   Candida tropicalis NOT DETECTED NOT DETECTED Final   Cryptococcus neoformans/gattii NOT DETECTED NOT DETECTED Final    Comment: Performed at [facility], [address], [place], [place] [address]  Blood Culture (routine x 2)     Status: None   Collection Time: 02/09/21  7:35 PM   Specimen: BLOOD  Result Value Ref Range Status   Specimen Description BLOOD SITE NOT SPECIFIED  Final   Special Requests   Final    BOTTLES DRAWN AEROBIC ONLY Blood Culture adequate volume   Culture   Final  NO GROWTH 5 DAYS Performed at [facility], [place] [address] [place], [place] [address]    Report Status 02/14/2021 FINAL  Final  Urine culture     Status: Abnormal   Collection Time: 02/09/21  9:24 PM   Specimen: In/Out Cath Urine  Result Value Ref Range Status   Specimen Description IN/OUT CATH URINE  Final   Special Requests   Final    NONE Performed at [facility], [place] [address] [place], [place] [address]    Culture >=100,000 COLONIES/mL ESCHERICHIA COLI (A)  Final   Report  Status 02/12/2021 FINAL  Final   Organism ID, Bacteria ESCHERICHIA COLI (A)  Final      Susceptibility   Escherichia coli - MIC*    AMPICILLIN 8 SENSITIVE Sensitive     CEFAZOLIN <=4 SENSITIVE Sensitive     CEFEPIME <=0.12 SENSITIVE Sensitive     CEFTRIAXONE <=0.25 SENSITIVE Sensitive     CIPROFLOXACIN <=0.25 SENSITIVE Sensitive     GENTAMICIN <=1 SENSITIVE Sensitive     IMIPENEM <=0.25 SENSITIVE Sensitive     NITROFURANTOIN 32 SENSITIVE Sensitive     TRIMETH/SULFA <=20 SENSITIVE Sensitive     AMPICILLIN/SULBACTAM 4 SENSITIVE Sensitive     PIP/TAZO <=4 SENSITIVE Sensitive     * >=100,000 COLONIES/mL ESCHERICHIA COLI      Radiology Studies: No results found.     LOS: 6 days   Lanina Larranaga [employer] on [email or web address]  02/15/2021, 10:35 AM

## 2021-02-16 DIAGNOSIS — N3000 Acute cystitis without hematuria: Secondary | ICD-10-CM | POA: Diagnosis not present

## 2021-02-16 DIAGNOSIS — M79605 Pain in left leg: Secondary | ICD-10-CM | POA: Diagnosis not present

## 2021-02-16 NOTE — Plan of Care (Signed)

## 2021-02-16 NOTE — TOC Progression Note (Signed)
Transition of Care [facility]) - Progression Note    Patient Details  Name: Denise King MRN: [id number] Date of Birth: April 15, 1951  [facility]) CM/SW Contact  Joanne Chars, LCSW Phone Number: 02/16/2021, 2:01 PM  Clinical Narrative:   CSW attempted to follow up with SNF facilities.  [facility]: no admissions coverage on weekends.  [facility], [facility], [facility]: no answer to attempted phone call.     Expected Discharge Plan: [place] Barriers to Discharge: Continued Medical Work up  Expected Discharge Plan and Services Expected Discharge Plan: [place] In-house Referral: NA Discharge Planning Services: CM Consult Post Acute Care Choice: [place] Living arrangements for the past 2 months: Single Family Home                 DME Arranged: N/A DME Agency: NA       HH Arranged: Patient Refused [place] Agency: NA         Social Determinants of Health (SDOH) Interventions    Readmission Risk Interventions Readmission Risk Prevention Plan 07/10/2020  Transportation Screening Complete  PCP or Specialist Appt within 3-5 Days Complete  HRI or [place] Complete  Social Work Consult for [place] Planning/Counseling Complete  Palliative Care Screening Not Applicable  Some recent data might be hidden

## 2021-02-16 NOTE — Progress Notes (Signed)
[facility] PROGRESS NOTE   QUINLAN MCFALL NKN:[id number] DOB: [birth year] DOA: 02/09/2021  PCP: Biagio Borg, MD  Brief History/Interval Summary: 70 y.o.femalewith medical history significant forchronic hypoxic respiratory failure on 2 L continuous supplemental oxygen, COPD, chronic pain syndrome on chronic opioid therapy,chronic diastolic heart failure, depression,who was admitted to [facility] on2/13/2022 with altered mental status.  Thought to be secondary to UTI.   Consultants: None  Procedures: None  Antibiotics: Anti-infectives (From admission, onward)   Start     Dose/Rate Route Frequency Ordered Stop   02/12/21 1400  cephALEXin (KEFLEX) capsule 500 mg        500 mg Oral Every 8 hours 02/12/21 0908 02/15/21 2016   02/10/21 2000  cefTRIAXone (ROCEPHIN) 1 g in sodium chloride 0.9 % 100 mL IVPB  Status:  Discontinued        1 g 200 mL/hr over 30 Minutes Intravenous Every 24 hours 02/09/21 2324 02/12/21 0908   02/09/21 1830  cefTRIAXone (ROCEPHIN) 1 g in sodium chloride 0.9 % 100 mL IVPB        1 g 200 mL/hr over 30 Minutes Intravenous  Once 02/09/21 1817 02/09/21 2017       Subjective/Interval History: Patient mentions that the left leg cramps are slowly improving.  Denies any other complaints.  No shortness of breath.  Waiting on going to rehab.     Assessment/Plan:  Acute metabolic encephalopathy This has resolved.  Was most likely secondary to urinary tract infection.  CT scan of the head did not show any acute findings.   TSH noted to be 0.175.  Free T4 is noted to be normal at 0.79.  Likely sick euthyroid.    E. coli urinary tract infection with sepsis present on admission/staph hominis bacteremia Initially placed on ceftriaxone.  Based on sensitivities she was changed over to Keflex.  7-day treatment as planned.   Staph hominis noted on blood cultures.  Likely contaminant.  Left hip/posterior thigh pain No clear etiology for same.   No obvious lesions noted on examination.  CT hip did not show any fractures.  The pain is mostly in the muscle area posteriorly.  CT did cover the area of concern and did not show any abnormalities.  Straight leg raising test was negative.  Started on Robaxin.  Dose was increased 2 days ago.  Symptoms appear to be slowly getting better.   If patient's symptoms do not improve in the next 2 to 3 weeks consideration may have to be given to doing MRI of the area of concern.  Chronic hypoxic respiratory failure/history of COPD On 2 L of oxygen at home.  Respiratory status seems to be stable at this time.  Acute kidney injury Resolved  History of GERD PPI  History of depression Has been taking Wellbutrin and Zoloft for at least 6 months.   Cymbalta was resumed by PCP about 8 days prior to admission.  Cymbalta was subsequently discontinued.   Wellbutrin and Zoloft were resumed.  Normocytic anemia Hemoglobin is stable.  No evidence of overt bleeding.  Chronic diastolic CHF Most recent echocardiogram done last July showed normal systolic function.  Appears to be reasonably compensated at this time.    Chronic tobacco abuse She was counseled.  Chronic pain syndrome Patient noted to be on MS Contin which is being continued.  PDMP database was reviewed.  Patient also on oxycodone 10 mg.  Was also on buprenorphine patch but apparently not taking it anymore.  History of lymphocytic colitis On budesonide.  Follows with Dr. Carlean Purl with gastroenterology.   DVT Prophylaxis:   SCDs Code Status: Full code Family Communication: Discussed with the patient Disposition Plan: Waiting on skilled nursing facility placement for short-term rehab.  Status is: Inpatient  Remains inpatient appropriate because:Ongoing active pain requiring inpatient pain management and Altered mental status   Dispo: The patient is from: Home              Anticipated d/c is to: SNF              Anticipated d/c date is:  2 days              Patient currently is not medically stable to d/c.   Difficult to place patient No       Medications:  Scheduled: . budesonide  9 mg Oral Daily  . buPROPion  150 mg Oral Daily  . methocarbamol  750 mg Oral TID  . morphine  15 mg Oral TID  . pantoprazole  40 mg Oral BID  . sertraline  200 mg Oral QHS  . vitamin B-12  1,000 mcg Oral Daily   Continuous:  LZJ:QBHALPFXTKWIO **OR** acetaminophen, alum & mag hydroxide-simeth, melatonin, methocarbamol, nicotine, ondansetron (ZOFRAN) IV, oxyCODONE   Objective:  Vital Signs  Vitals:   02/15/21 1454 02/16/21 0020 02/16/21 0500 02/16/21 0719  BP: (!) 99/55 130/74  131/72  Pulse: 83 80  69  Resp: 16 17    Temp:  98.5 F (36.9 C)    TempSrc:      SpO2: 94% 98%  98%  Weight:   56 kg   Height:        Intake/Output Summary (Last 24 hours) at 02/16/2021 1036 Last data filed at 02/16/2021 0419 Gross per 24 hour  Intake 200 ml  Output 400 ml  Net -200 ml   Filed Weights   02/13/21 0500 02/16/21 0500  Weight: 56.2 kg 56 kg    General appearance: Awake alert.  In no distress Resp: Clear to auscultation bilaterally.  Normal effort Cardio: S1-S2 is normal regular.  No S3-S4.  No rubs murmurs or bruit GI: Abdomen is soft.  Nontender nondistended.  Bowel sounds are present normal.  No masses organomegaly     Lab Results:  Data Reviewed: I have personally reviewed following labs and imaging studies  CBC: Recent Labs  Lab 02/09/21 1636 02/09/21 1704 02/10/21 0405 02/11/21 0155 02/12/21 0317 02/14/21 0429  WBC 10.7*  --  11.9* 10.6* 9.2 8.7  NEUTROABS 8.4*  --  8.7* 6.4 5.7  --   HGB 10.7* 11.6* 10.5* 10.1* 11.6* 11.4*  HCT 34.8* 34.0* 33.6* 30.5* 36.5 36.4  MCV 98.0  --  98.2 95.0 95.1 96.3  PLT 313  --  292 307 350 973    Basic Metabolic Panel: Recent Labs  Lab 02/09/21 1636 02/09/21 1704 02/09/21 2325 02/10/21 0405 02/11/21 0155 02/12/21 0317 02/14/21 0429  NA 134* 134*  --  136 136  137 140  K 4.3 4.2  --  4.4 3.8 4.1 4.9  CL 100  --   --  100 99 97* 96*  CO2 22  --   --  27 30 29  33*  GLUCOSE 135*  --   --  114* 101* 76 99  BUN 14  --   --  10 7* 7* 7*  CREATININE 1.09*  --   --  0.74 0.72 0.64 0.78  CALCIUM 8.2*  --   --  7.9* 7.9* 8.4* 9.1  MG  --   --  2.1 1.9 1.7 2.4  --   PHOS  --   --   --   --  2.7 3.6  --     GFR: Estimated Creatinine Clearance: 56.4 mL/min (by C-G formula based on SCr of 0.78 mg/dL).  Liver Function Tests: Recent Labs  Lab 02/09/21 1636 02/10/21 0405 02/11/21 0155 02/12/21 0317  AST 34 23 13* 13*  ALT 13 13 11 9   ALKPHOS 68 64 57 65  BILITOT 0.4 0.7 0.5 0.6  PROT 6.2* 5.5* 5.6* 5.9*  ALBUMIN 3.2* 2.8* 2.7* 2.9*     Coagulation Profile: Recent Labs  Lab 02/09/21 1636  INR 1.0     Thyroid Function Tests: No results for input(s): TSH, T4TOTAL, FREET4, T3FREE, THYROIDAB in the last 72 hours.    Recent Results (from the past 240 hour(s))  Blood Culture (routine x 2)     Status: Abnormal   Collection Time: 02/09/21  4:36 PM   Specimen: BLOOD LEFT FOREARM  Result Value Ref Range Status   Specimen Description BLOOD LEFT FOREARM  Final   Special Requests   Final    BOTTLES DRAWN AEROBIC AND ANAEROBIC Blood Culture adequate volume   Culture  Setup Time   Final    GRAM POSITIVE COCCI IN CLUSTERS IN BOTH AEROBIC AND ANAEROBIC BOTTLES CRITICAL RESULT CALLED TO, READ BACK BY AND VERIFIED WITH: A PAYTES [id number] 02/10/21 A BROWNING    Culture (A)  Final    STAPHYLOCOCCUS HOMINIS THE SIGNIFICANCE OF ISOLATING THIS ORGANISM FROM A SINGLE SET OF BLOOD CULTURES WHEN MULTIPLE SETS ARE DRAWN IS UNCERTAIN. PLEASE NOTIFY THE MICROBIOLOGY DEPARTMENT WITHIN ONE WEEK IF SPECIATION AND SENSITIVITIES ARE REQUIRED. Performed at [facility], [place] [address] [place], [place] [address]    Report Status 02/12/2021 FINAL  Final  Resp Panel by RT-PCR (Flu A&B, Covid) Nasopharyngeal Swab     Status: None   Collection Time: 02/09/21   4:36 PM   Specimen: Nasopharyngeal Swab; Nasopharyngeal(NP) swabs in vial transport medium  Result Value Ref Range Status   SARS Coronavirus 2 by RT PCR NEGATIVE NEGATIVE Final    Comment: (NOTE) SARS-CoV-2 target nucleic acids are NOT DETECTED.  The SARS-CoV-2 RNA is generally detectable in upper respiratory specimens during the acute phase of infection. The lowest concentration of SARS-CoV-2 viral copies this assay can detect is 138 copies/mL. A negative result does not preclude SARS-Cov-2 infection and should not be used as the sole basis for treatment or other patient management decisions. A negative result may occur with  improper specimen collection/handling, submission of specimen other than nasopharyngeal swab, presence of viral mutation(s) within the areas targeted by this assay, and inadequate number of viral copies(<138 copies/mL). A negative result must be combined with clinical observations, patient history, and epidemiological information. The expected result is Negative.  Fact Sheet for Patients:  EntrepreneurPulse.com.au  Fact Sheet for Healthcare Providers:  IncredibleEmployment.be  This test is no t yet approved or cleared by the [place] FDA and  has been authorized for detection and/or diagnosis of SARS-CoV-2 by FDA under an Emergency Use Authorization (EUA). This EUA will remain  in effect (meaning this test can be used) for the duration of the COVID-19 declaration under Section 564(b)(1) of the Act, 21 U.S.C.section 360bbb-3(b)(1), unless the authorization is terminated  or revoked sooner.       Influenza A by PCR NEGATIVE NEGATIVE Final   Influenza B  by PCR NEGATIVE NEGATIVE Final    Comment: (NOTE) The Xpert Xpress SARS-CoV-2/FLU/RSV plus assay is intended as an aid in the diagnosis of influenza from Nasopharyngeal swab specimens and should not be used as a sole basis for treatment. Nasal washings and aspirates  are unacceptable for Xpert Xpress SARS-CoV-2/FLU/RSV testing.  Fact Sheet for Patients: EntrepreneurPulse.com.au  Fact Sheet for Healthcare Providers: IncredibleEmployment.be  This test is not yet approved or cleared by the [place] FDA and has been authorized for detection and/or diagnosis of SARS-CoV-2 by FDA under an Emergency Use Authorization (EUA). This EUA will remain in effect (meaning this test can be used) for the duration of the COVID-19 declaration under Section 564(b)(1) of the Act, 21 U.S.C. section 360bbb-3(b)(1), unless the authorization is terminated or revoked.  Performed at [facility], [place] [address] [place], [place] [address]   Blood Culture ID Panel (Reflexed)     Status: Abnormal   Collection Time: 02/09/21  4:36 PM  Result Value Ref Range Status   Enterococcus faecalis NOT DETECTED NOT DETECTED Final   Enterococcus Faecium NOT DETECTED NOT DETECTED Final   Listeria monocytogenes NOT DETECTED NOT DETECTED Final   Staphylococcus species DETECTED (A) NOT DETECTED Final    Comment: CRITICAL RESULT CALLED TO, READ BACK BY AND VERIFIED WITH: A PAYTES PHARMD 1425 02/10/21 A BROWNING    Staphylococcus aureus (BCID) NOT DETECTED NOT DETECTED Final   Staphylococcus epidermidis NOT DETECTED NOT DETECTED Final   Staphylococcus lugdunensis NOT DETECTED NOT DETECTED Final   Streptococcus species NOT DETECTED NOT DETECTED Final   Streptococcus agalactiae NOT DETECTED NOT DETECTED Final   Streptococcus pneumoniae NOT DETECTED NOT DETECTED Final   Streptococcus pyogenes NOT DETECTED NOT DETECTED Final   A.calcoaceticus-baumannii NOT DETECTED NOT DETECTED Final   Bacteroides fragilis NOT DETECTED NOT DETECTED Final   Enterobacterales NOT DETECTED NOT DETECTED Final   Enterobacter cloacae complex NOT DETECTED NOT DETECTED Final   Escherichia coli NOT DETECTED NOT DETECTED Final   Klebsiella aerogenes NOT DETECTED NOT  DETECTED Final   Klebsiella oxytoca NOT DETECTED NOT DETECTED Final   Klebsiella pneumoniae NOT DETECTED NOT DETECTED Final   Proteus species NOT DETECTED NOT DETECTED Final   Salmonella species NOT DETECTED NOT DETECTED Final   Serratia marcescens NOT DETECTED NOT DETECTED Final   Haemophilus influenzae NOT DETECTED NOT DETECTED Final   Neisseria meningitidis NOT DETECTED NOT DETECTED Final   Pseudomonas aeruginosa NOT DETECTED NOT DETECTED Final   Stenotrophomonas maltophilia NOT DETECTED NOT DETECTED Final   Candida albicans NOT DETECTED NOT DETECTED Final   Candida auris NOT DETECTED NOT DETECTED Final   Candida glabrata NOT DETECTED NOT DETECTED Final   Candida krusei NOT DETECTED NOT DETECTED Final   Candida parapsilosis NOT DETECTED NOT DETECTED Final   Candida tropicalis NOT DETECTED NOT DETECTED Final   Cryptococcus neoformans/gattii NOT DETECTED NOT DETECTED Final    Comment: Performed at [facility], [address] [place], [place] [address]  Blood Culture (routine x 2)     Status: None   Collection Time: 02/09/21  7:35 PM   Specimen: BLOOD  Result Value Ref Range Status   Specimen Description BLOOD SITE NOT SPECIFIED  Final   Special Requests   Final    BOTTLES DRAWN AEROBIC ONLY Blood Culture adequate volume   Culture   Final    NO GROWTH 5 DAYS Performed at [facility], [address] [place], [place] [address]    Report  Status 02/14/2021 FINAL  Final  Urine culture     Status: Abnormal   Collection Time: 02/09/21  9:24 PM   Specimen: In/Out Cath Urine  Result Value Ref Range Status   Specimen Description IN/OUT CATH URINE  Final   Special Requests   Final    NONE Performed at [facility], [place] [address] [place], [place] [address]    Culture >=100,000 COLONIES/mL ESCHERICHIA COLI (A)  Final   Report Status 02/12/2021 FINAL  Final   Organism ID, Bacteria ESCHERICHIA COLI (A)  Final      Susceptibility   Escherichia coli - MIC*     AMPICILLIN 8 SENSITIVE Sensitive     CEFAZOLIN <=4 SENSITIVE Sensitive     CEFEPIME <=0.12 SENSITIVE Sensitive     CEFTRIAXONE <=0.25 SENSITIVE Sensitive     CIPROFLOXACIN <=0.25 SENSITIVE Sensitive     GENTAMICIN <=1 SENSITIVE Sensitive     IMIPENEM <=0.25 SENSITIVE Sensitive     NITROFURANTOIN 32 SENSITIVE Sensitive     TRIMETH/SULFA <=20 SENSITIVE Sensitive     AMPICILLIN/SULBACTAM 4 SENSITIVE Sensitive     PIP/TAZO <=4 SENSITIVE Sensitive     * >=100,000 COLONIES/mL ESCHERICHIA COLI      Radiology Studies: No results found.     LOS: 7 days   Calandra Madura [employer] on [email or web address]  02/16/2021, 10:36 AM

## 2021-02-17 ENCOUNTER — Inpatient Hospital Stay (HOSPITAL_COMMUNITY): Payer: Medicare HMO

## 2021-02-17 DIAGNOSIS — N3000 Acute cystitis without hematuria: Secondary | ICD-10-CM | POA: Diagnosis not present

## 2021-02-17 LAB — COMPREHENSIVE METABOLIC PANEL
ALT: 8 U/L (ref 0–44)
AST: 15 U/L (ref 15–41)
Albumin: 3 g/dL — ABNORMAL LOW (ref 3.5–5.0)
Alkaline Phosphatase: 74 U/L (ref 38–126)
Anion gap: 10 (ref 5–15)
BUN: 10 mg/dL (ref 8–23)
CO2: 31 mmol/L (ref 22–32)
Calcium: 8.8 mg/dL — ABNORMAL LOW (ref 8.9–10.3)
Chloride: 95 mmol/L — ABNORMAL LOW (ref 98–111)
Creatinine, Ser: 0.85 mg/dL (ref 0.44–1.00)
GFR, Estimated: >60 mL/min (ref >60)
Glucose, Bld: 101 mg/dL — ABNORMAL HIGH (ref 70–99)
Potassium: 3.7 mmol/L (ref 3.5–5.1)
Sodium: 136 mmol/L (ref 135–145)
Total Bilirubin: 0.3 mg/dL (ref 0.3–1.2)
Total Protein: 6.1 g/dL — ABNORMAL LOW (ref 6.5–8.1)

## 2021-02-17 LAB — CBC
HCT: 32.7 % — ABNORMAL LOW (ref 36.0–46.0)
Hemoglobin: 10.9 g/dL — ABNORMAL LOW (ref 12.0–15.0)
MCH: 31.3 pg (ref 26.0–34.0)
MCHC: 33.3 g/dL (ref 30.0–36.0)
MCV: 94 fL (ref 80.0–100.0)
Platelets: 352 10*3/uL (ref 150–400)
RBC: 3.48 MIL/uL — ABNORMAL LOW (ref 3.87–5.11)
RDW: 11.9 % (ref 11.5–15.5)
WBC: 9.4 10*3/uL (ref 4.0–10.5)
nRBC: 0 % (ref 0.0–0.2)

## 2021-02-17 MED ORDER — METHOCARBAMOL 500 MG PO TABS: 500.0000 mg | ORAL_TABLET | Freq: Four times a day (QID) | ORAL | Status: DC | PRN

## 2021-02-17 MED ORDER — MORPHINE SULFATE ER 15 MG PO TBCR: 15.0000 mg | EXTENDED_RELEASE_TABLET | Freq: Three times a day (TID) | ORAL | 0 refills | Status: DC

## 2021-02-17 MED ORDER — OXYCODONE HCL 10 MG PO TABS: 10.0000 mg | ORAL_TABLET | Freq: Four times a day (QID) | ORAL | 0 refills | Status: DC | PRN

## 2021-02-17 MED ORDER — METHOCARBAMOL 750 MG PO TABS: 750.0000 mg | ORAL_TABLET | Freq: Three times a day (TID) | ORAL | Status: DC

## 2021-02-17 NOTE — Consult Note (Signed)
   [facility] Inpatient Consult   02/17/2021  Denise King 05-16-1951 [id number]  [facility] [ACO] Patient: [name]   Patient screened for length of stay  hospitalization to assess for barriers to transition of care and for potential [facility] Management service needs for post hospital transition.  Review of patient's medical record reveals patient is being recommended for a skilled nursing facility level of care for rehab.  Plan:  No [facility] Care Management follow up needed for SNF level of care.  For questions contact:   Natividad Brood, RN BSN [facility] Liaison  [phone] business mobile phone Toll free office [phone]  Fax number: [phone] [email or web address]

## 2021-02-17 NOTE — Progress Notes (Signed)
Physical Therapy Treatment Patient Details Name: Denise King MRN: [id number] DOB: 09-03-1951 Today's Date: 02/17/2021    History of Present Illness Pt is 70 y.o. female with medical history significant for chronic hypoxic respiratory failure on 2 L continuous supplemental oxygen, COPD, chronic pain syndrome on chronic opioid therapy , chronic diastolic heart failure, depression, who is admitted to [facility] on 02/09/2021 with suspected acute metabolic encephalopathy due to sepsis from UTI. Patient reported fall on 2/2 (tripped on doorframe) landing on left hip and was seen in ED. Xray and CT hip negative for fracture. +hit her head but denied .  She did have R foot fracture but reports she was told she could ambulate.    PT Comments    Continues to improve despite back, hip and rt toe pain. She reports left piriformis stretch feels good and likely helps, however she forgets to do this on her own. Tolerated 2 short walks today and maintained foot on the ground the entire time.     Follow Up Recommendations  SNF     Equipment Recommendations  None recommended by PT    Recommendations for Other Services       Precautions / Restrictions Precautions Precautions: Fall Precaution Comments: 3 falls in 3 years, monitor O2    Mobility  Bed Mobility Overal bed mobility: Needs Assistance Bed Mobility: Supine to Sit;Sit to Supine     Supine to sit: HOB elevated;Modified independent (Device/Increase time) Sit to supine: HOB elevated;Min assist   General bed mobility comments: increased time due to pain; assist to raise RLE onto bed due to back pain    Transfers Overall transfer level: Needs assistance Equipment used: Rolling walker (2 wheeled) Transfers: Sit to/from Stand Sit to Stand: Min assist Stand pivot transfers: Min assist       General transfer comment: Min A for power up with cues for hand placement  Ambulation/Gait Ambulation/Gait assistance: Min  guard Gait Distance (Feet): 30 Feet (seated rest; 20) Assistive device: Rolling walker (2 wheeled) Gait Pattern/deviations: Step-to pattern;Decreased stance time - left;Decreased stride length Gait velocity: decreased   General Gait Details: Step to L gait with use of RW to offload weight; cues for use of RW   Stairs             Wheelchair Mobility    Modified Rankin (Stroke Patients Only)       Balance Overall balance assessment: History of Falls;Needs assistance Sitting-balance support: No upper extremity supported Sitting balance-Leahy Scale: Fair Sitting balance - Comments: tendency to lean to R to offload painful L hip   Standing balance support: During functional activity;Bilateral upper extremity supported;Single extremity supported Standing balance-Leahy Scale: Poor Standing balance comment: reliant on at least one UE support                            Cognition Arousal/Alertness: Awake/alert Behavior During Therapy: WFL for tasks assessed/performed Overall Cognitive Status: Within Functional Limits for tasks assessed                                 General Comments: A&OX4. cognition at baseline      Exercises General Exercises - Lower Extremity Ankle Circles/Pumps: AROM;Both;10 reps Long Arc Quad: AROM;Both;10 reps Hip Flexion/Marching: AROM;Both;5 reps Other Exercises Other Exercises: Light L piriformis stretch in sitting 30 second hold. Repeated on RLE. pt reports she forgot to  do the stretch she was shown previously    General Comments        Pertinent Vitals/Pain Pain Assessment: 0-10 Pain Score: 5  Pain Location: left hip, back, rt toes Pain Descriptors / Indicators: Aching;Grimacing;Guarding;Cramping Pain Intervention(s): Limited activity within patient's tolerance;Monitored during session    Home Living                      Prior Function            PT Goals (current goals can now be found in the  care plan section) Acute Rehab PT Goals Patient Stated Goal: to return home to husband PT Goal Formulation: With patient Time For Goal Achievement: 02/26/21 Potential to Achieve Goals: Good Progress towards PT goals: Progressing toward goals    Frequency    Min 3X/week      PT Plan Current plan remains appropriate    Co-evaluation              AM-PAC PT "6 Clicks" Mobility   Outcome Measure  Help needed turning from your back to your side while in a flat bed without using bedrails?: A Little Help needed moving from lying on your back to sitting on the side of a flat bed without using bedrails?: A Little Help needed moving to and from a bed to a chair (including a wheelchair)?: A Little Help needed standing up from a chair using your arms (e.g., wheelchair or bedside chair)?: A Little Help needed to walk in hospital room?: A Little Help needed climbing 3-5 steps with a railing? : A Lot 6 Click Score: 17    End of Session Equipment Utilized During Treatment: Gait belt Activity Tolerance: Patient limited by pain Patient left: in bed;with call bell/phone within reach   PT Visit Diagnosis: Difficulty in walking, not elsewhere classified (R26.2);Pain Pain - Right/Left: Left Pain - part of body: Hip     Time: 0947-0962 PT Time Calculation (min) (ACUTE ONLY): 24 min  Charges:  $Gait Training: 8-22 mins $Therapeutic Exercise: 8-22 mins                      Arby Barrette, PT Pager [phone]    Rexanne Mano 02/17/2021, 1:35 PM

## 2021-02-17 NOTE — Discharge Summary (Signed)
[facility]  Physician Discharge Summary   Patient ID: Denise King MRN: [id number] DOB/AGE: May 26, 1951 70 y.o.  Admit date: 02/09/2021 Discharge date: 02/17/2021  PCP: Biagio Borg, MD  DISCHARGE DIAGNOSES:  Acute metabolic encephalopathy, resolved E. coli UTI with sepsis present on admission Left hip/posterior thigh pain History of COPD Chronic respiratory failure with hypoxia History of GERD History of depression Normocytic anemia Chronic diastolic CHF Chronic pain syndrome History of lymphocytic colitis  RECOMMENDATIONS FOR OUTPATIENT FOLLOW UP: 1. CBC and basic metabolic panel in 1 week 2. Check her TSH and free T4 in 3 to 4 weeks.    Home Health: Going to SNF Equipment/Devices: None  CODE STATUS: Full code  DISCHARGE CONDITION: fair  Diet recommendation: Heart Healthy  INITIAL HISTORY: 70 y.o.femalewith medical history significant forchronic hypoxic respiratory failure on 2 L continuous supplemental oxygen, COPD, chronic pain syndrome on chronic opioid therapy,chronic diastolic heart failure, depression,who was admitted to [facility] on2/13/2022 with altered mental status.  Thought to be secondary to UTI.   HOSPITAL COURSE:    Acute metabolic encephalopathy This has resolved.  Was most likely secondary to urinary tract infection.  CT scan of the head did not show any acute findings.   TSH noted to be 0.175.  Free T4 is noted to be normal at 0.79.  Likely sick euthyroid.    E. coli urinary tract infection with sepsis present on admission/staph hominis bacteremia Initially placed on ceftriaxone.  Based on sensitivities she was changed over to Keflex.    She has completed 7-day treatment. Staph hominis noted on blood cultures.  Likely contaminant.  Left hip/posterior thigh pain No clear etiology for same.  No obvious lesions noted on examination.  CT hip did not show any fractures.  The pain is mostly in the muscle area  posteriorly.  CT did cover the area of concern and did not show any abnormalities.  Straight leg raising test was negative.    Patient was started on Robaxin.  Symptoms were getting better but noted to have more pain this morning.  Patient was told that this might take a while to get better.  Due to persistent symptoms we will proceed with MRI to rule out any concerning findings.   Chronic hypoxic respiratory failure/history of COPD Chronically on 2 L of oxygen.  Respiratory status seems to be stable at this time.  Acute kidney injury Resolved  History of GERD PPI  History of depression Continue with Wellbutrin and Zoloft.  Cymbalta discontinued.  Normocytic anemia Hemoglobin is stable.  No evidence of overt bleeding.  Chronic diastolic CHF Most recent echocardiogram done last July showed normal systolic function.  Appears to be reasonably compensated at this time.    Chronic tobacco abuse She was counseled.  Chronic pain syndrome Patient noted to be on MS Contin which is being continued.  PDMP database was reviewed.  Patient also on oxycodone 10 mg.  Was also on buprenorphine patch but apparently not taking it anymore.  History of lymphocytic colitis On budesonide.  Follows with Dr. Carlean Purl with gastroenterology.   Overall stable.  Okay for discharge to SNF when bed is available.  MRI of the left leg is pending.   PERTINENT LABS:  The results of significant diagnostics from this hospitalization (including imaging, microbiology, ancillary and laboratory) are listed below for reference.    Microbiology: Recent Results (from the past 240 hour(s))  Blood Culture (routine x 2)     Status: Abnormal   Collection  Time: 02/09/21  4:36 PM   Specimen: BLOOD LEFT FOREARM  Result Value Ref Range Status   Specimen Description BLOOD LEFT FOREARM  Final   Special Requests   Final    BOTTLES DRAWN AEROBIC AND ANAEROBIC Blood Culture adequate volume   Culture  Setup Time    Final    GRAM POSITIVE COCCI IN CLUSTERS IN BOTH AEROBIC AND ANAEROBIC BOTTLES CRITICAL RESULT CALLED TO, READ BACK BY AND VERIFIED WITH: A PAYTES [id number] [id number] 02/10/21 A BROWNING    Culture (A)  Final    STAPHYLOCOCCUS HOMINIS THE SIGNIFICANCE OF ISOLATING THIS ORGANISM FROM A SINGLE SET OF BLOOD CULTURES WHEN MULTIPLE SETS ARE DRAWN IS UNCERTAIN. PLEASE NOTIFY THE MICROBIOLOGY DEPARTMENT WITHIN ONE WEEK IF SPECIATION AND SENSITIVITIES ARE REQUIRED. Performed at [facility], [place] [address], [place], [place] [address]    Report Status 02/12/2021 FINAL  Final  Resp Panel by RT-PCR (Flu A&B, Covid) Nasopharyngeal Swab     Status: None   Collection Time: 02/09/21  4:36 PM   Specimen: Nasopharyngeal Swab; Nasopharyngeal(NP) swabs in vial transport medium  Result Value Ref Range Status   SARS Coronavirus 2 by RT PCR NEGATIVE NEGATIVE Final    Comment: (NOTE) SARS-CoV-2 target nucleic acids are NOT DETECTED.  The SARS-CoV-2 RNA is generally detectable in upper respiratory specimens during the acute phase of infection. The lowest concentration of SARS-CoV-2 viral copies this assay can detect is 138 copies/mL. A negative result does not preclude SARS-Cov-2 infection and should not be used as the sole basis for treatment or other patient management decisions. A negative result may occur with  improper specimen collection/handling, submission of specimen other than nasopharyngeal swab, presence of viral mutation(s) within the areas targeted by this assay, and inadequate number of viral copies(<138 copies/mL). A negative result must be combined with clinical observations, patient history, and epidemiological information. The expected result is Negative.  Fact Sheet for Patients:  EntrepreneurPulse.com.au  Fact Sheet for Healthcare Providers:  IncredibleEmployment.be  This test is no t yet approved or cleared by the [place] FDA and  has been  authorized for detection and/or diagnosis of SARS-CoV-2 by FDA under an Emergency Use Authorization (EUA). This EUA will remain  in effect (meaning this test can be used) for the duration of the COVID-19 declaration under Section 564(b)(1) of the Act, 21 U.S.C.section 360bbb-3(b)(1), unless the authorization is terminated  or revoked sooner.       Influenza A by PCR NEGATIVE NEGATIVE Final   Influenza B by PCR NEGATIVE NEGATIVE Final    Comment: (NOTE) The Xpert Xpress SARS-CoV-2/FLU/RSV plus assay is intended as an aid in the diagnosis of influenza from Nasopharyngeal swab specimens and should not be used as a sole basis for treatment. Nasal washings and aspirates are unacceptable for Xpert Xpress SARS-CoV-2/FLU/RSV testing.  Fact Sheet for Patients: EntrepreneurPulse.com.au  Fact Sheet for Healthcare Providers: IncredibleEmployment.be  This test is not yet approved or cleared by the [place] FDA and has been authorized for detection and/or diagnosis of SARS-CoV-2 by FDA under an Emergency Use Authorization (EUA). This EUA will remain in effect (meaning this test can be used) for the duration of the COVID-19 declaration under Section 564(b)(1) of the Act, 21 U.S.C. section 360bbb-3(b)(1), unless the authorization is terminated or revoked.  Performed at [facility], [place] [address] [place], [place] [address]   Blood Culture ID Panel (Reflexed)     Status: Abnormal   Collection Time: 02/09/21  4:36 PM  Result Value Ref Range Status   Enterococcus faecalis NOT DETECTED NOT DETECTED Final   Enterococcus Faecium NOT DETECTED NOT DETECTED Final   Listeria monocytogenes NOT DETECTED NOT DETECTED Final   Staphylococcus species DETECTED (A) NOT DETECTED Final    Comment: CRITICAL RESULT CALLED TO, READ BACK BY AND VERIFIED WITH: A PAYTES PHARMD 1425 02/10/21 A BROWNING    Staphylococcus aureus (BCID) NOT DETECTED NOT DETECTED Final    Staphylococcus epidermidis NOT DETECTED NOT DETECTED Final   Staphylococcus lugdunensis NOT DETECTED NOT DETECTED Final   Streptococcus species NOT DETECTED NOT DETECTED Final   Streptococcus agalactiae NOT DETECTED NOT DETECTED Final   Streptococcus pneumoniae NOT DETECTED NOT DETECTED Final   Streptococcus pyogenes NOT DETECTED NOT DETECTED Final   A.calcoaceticus-baumannii NOT DETECTED NOT DETECTED Final   Bacteroides fragilis NOT DETECTED NOT DETECTED Final   Enterobacterales NOT DETECTED NOT DETECTED Final   Enterobacter cloacae complex NOT DETECTED NOT DETECTED Final   Escherichia coli NOT DETECTED NOT DETECTED Final   Klebsiella aerogenes NOT DETECTED NOT DETECTED Final   Klebsiella oxytoca NOT DETECTED NOT DETECTED Final   Klebsiella pneumoniae NOT DETECTED NOT DETECTED Final   Proteus species NOT DETECTED NOT DETECTED Final   Salmonella species NOT DETECTED NOT DETECTED Final   Serratia marcescens NOT DETECTED NOT DETECTED Final   Haemophilus influenzae NOT DETECTED NOT DETECTED Final   Neisseria meningitidis NOT DETECTED NOT DETECTED Final   Pseudomonas aeruginosa NOT DETECTED NOT DETECTED Final   Stenotrophomonas maltophilia NOT DETECTED NOT DETECTED Final   Candida albicans NOT DETECTED NOT DETECTED Final   Candida auris NOT DETECTED NOT DETECTED Final   Candida glabrata NOT DETECTED NOT DETECTED Final   Candida krusei NOT DETECTED NOT DETECTED Final   Candida parapsilosis NOT DETECTED NOT DETECTED Final   Candida tropicalis NOT DETECTED NOT DETECTED Final   Cryptococcus neoformans/gattii NOT DETECTED NOT DETECTED Final    Comment: Performed at [facility], [address], [place], [place] [address]  Blood Culture (routine x 2)     Status: None   Collection Time: 02/09/21  7:35 PM   Specimen: BLOOD  Result Value Ref Range Status   Specimen Description BLOOD SITE NOT SPECIFIED  Final   Special Requests   Final    BOTTLES DRAWN AEROBIC ONLY Blood Culture adequate  volume   Culture   Final    NO GROWTH 5 DAYS Performed at [facility], [address], [place], [place] [address]    Report Status 02/14/2021 FINAL  Final  Urine culture     Status: Abnormal   Collection Time: 02/09/21  9:24 PM   Specimen: In/Out Cath Urine  Result Value Ref Range Status   Specimen Description IN/OUT CATH URINE  Final   Special Requests   Final    NONE Performed at [facility], [place] [address], [place], [place] [address]    Culture >=100,000 COLONIES/mL ESCHERICHIA COLI (A)  Final   Report Status 02/12/2021 FINAL  Final   Organism ID, Bacteria ESCHERICHIA COLI (A)  Final      Susceptibility   Escherichia coli - MIC*    AMPICILLIN 8 SENSITIVE Sensitive     CEFAZOLIN <=4 SENSITIVE Sensitive     CEFEPIME <=0.12 SENSITIVE Sensitive     CEFTRIAXONE <=0.25 SENSITIVE Sensitive     CIPROFLOXACIN <=0.25 SENSITIVE Sensitive     GENTAMICIN <=1 SENSITIVE Sensitive     IMIPENEM <=0.25 SENSITIVE Sensitive     NITROFURANTOIN 32  SENSITIVE Sensitive     TRIMETH/SULFA <=20 SENSITIVE Sensitive     AMPICILLIN/SULBACTAM 4 SENSITIVE Sensitive     PIP/TAZO <=4 SENSITIVE Sensitive     * >=100,000 COLONIES/mL ESCHERICHIA COLI     Labs:  COVID-19 Labs  Lab Results  Component Value Date   SARSCOV2NAA NEGATIVE 02/09/2021   [place] NEGATIVE 07/04/2020   [place] NEGATIVE 02/19/2020   [place] NEGATIVE 02/12/2020      Basic Metabolic Panel: Recent Labs  Lab 02/11/21 0155 02/12/21 0317 02/14/21 0429 02/17/21 0330  NA 136 137 140 136  K 3.8 4.1 4.9 3.7  CL 99 97* 96* 95*  CO2 30 29 33* 31  GLUCOSE 101* 76 99 101*  BUN 7* 7* 7* 10  CREATININE 0.72 0.64 0.78 0.85  CALCIUM 7.9* 8.4* 9.1 8.8*  MG 1.7 2.4  --   --   PHOS 2.7 3.6  --   --    Liver Function Tests: Recent Labs  Lab 02/11/21 0155 02/12/21 0317 02/17/21 0330  AST 13* 13* 15  ALT 11 9 8   ALKPHOS 57 65 74  BILITOT 0.5 0.6 0.3  PROT 5.6* 5.9* 6.1*  ALBUMIN 2.7* 2.9* 3.0*   No  results for input(s): LIPASE, AMYLASE in the last 168 hours. No results for input(s): AMMONIA in the last 168 hours. CBC: Recent Labs  Lab 02/11/21 0155 02/12/21 0317 02/14/21 0429 02/17/21 0330  WBC 10.6* 9.2 8.7 9.4  NEUTROABS 6.4 5.7  --   --   HGB 10.1* 11.6* 11.4* 10.9*  HCT 30.5* 36.5 36.4 32.7*  MCV 95.0 95.1 96.3 94.0  PLT 307 350 366 352   BNP: BNP (last 3 results) Recent Labs    07/08/20 0232 07/09/20 0247 07/10/20 0252  BNP 230.8* 97.5 96.9     IMAGING STUDIES DG Thoracic Spine 2 View  Result Date: 01/28/2021 CLINICAL DATA:  Pain status post fall EXAM: THORACIC SPINE 2 VIEWS COMPARISON:  None. FINDINGS: There is an S-shaped curvature of the thoracolumbar spine. No acute compression fracture. There is osteopenia. Degenerative changes are noted of the thoracic spine. IMPRESSION: Negative. Electronically Signed   By: Camillia Holster M.D.   On: 01/28/2021 17:57   DG Lumbar Spine 2-3 Views  Result Date: 01/28/2021 CLINICAL DATA:  Pain status post fall EXAM: LUMBAR SPINE - 2-3 VIEW COMPARISON:  None. FINDINGS: The patient is status post prior L4 through S1 posterior fusion. There is severe disc height loss at the L3-L4 disc space. There is no definite acute compression fracture. Vascular calcifications are noted. IMPRESSION: Negative. Electronically Signed   By: Hayleen Holster M.D.   On: 01/28/2021 17:55   DG Sacrum/Coccyx  Result Date: 01/28/2021 CLINICAL DATA:  70 year old female with fall and pain in the tailbone. EXAM: SACRUM AND COCCYX - 2+ VIEW COMPARISON:  CT abdomen pelvis dated 06/11/2020. FINDINGS: No acute fracture. Probable old healed fracture of the mid sacrum. The bones are osteopenic. L4-S1 posterior fusion. Partially visualized right femoral hardware. The soft tissues are unremarkable. IMPRESSION: No acute fracture. Electronically Signed   By: Anner Crete M.D.   On: 01/28/2021 21:13   CT HEAD WO CONTRAST  Result Date: 02/11/2021 CLINICAL DATA:   Encephalopathy EXAM: CT HEAD WITHOUT CONTRAST TECHNIQUE: Contiguous axial images were obtained from the base of the skull through the vertex without intravenous contrast. COMPARISON:  01/28/2021 FINDINGS: Brain: There is no mass, hemorrhage or extra-axial collection. The size and configuration of the ventricles and extra-axial CSF spaces are normal. There is hypoattenuation of  the white matter, most commonly indicating chronic small vessel disease. Vascular: Atherosclerotic calcification of the internal carotid arteries at the skull base. No abnormal hyperdensity of the major intracranial arteries or dural venous sinuses. Skull: The visualized skull base, calvarium and extracranial soft tissues are normal. Sinuses/Orbits: No fluid levels or advanced mucosal thickening of the visualized paranasal sinuses. No mastoid or middle ear effusion. The orbits are normal. IMPRESSION: Chronic small vessel disease without acute intracranial abnormality. Electronically Signed   By: Ulyses Jarred M.D.   On: 02/11/2021 21:54   CT Head Wo Contrast  Result Date: 01/28/2021 CLINICAL DATA:  Fall, hit back of head EXAM: CT HEAD WITHOUT CONTRAST TECHNIQUE: Contiguous axial images were obtained from the base of the skull through the vertex without intravenous contrast. COMPARISON:  07/04/2020 FINDINGS: Brain: There is no acute intracranial hemorrhage, mass effect, or edema. Gray-white differentiation is preserved. There is no extra-axial fluid collection. Ventricles and sulci are within normal limits in size and configuration. Vascular: There is atherosclerotic calcification at the skull base. Skull: Calvarium is unremarkable. Sinuses/Orbits: No acute finding. Other: None. IMPRESSION: No evidence of acute traumatic injury. Electronically Signed   By: Macy Mis M.D.   On: 01/28/2021 21:23   CT HIP LEFT WO CONTRAST  Result Date: 02/12/2021 CLINICAL DATA:  Left hip pain after fall on 02/08/2021 EXAM: CT OF THE LEFT HIP WITHOUT  CONTRAST TECHNIQUE: Multidetector CT imaging of the left hip was performed according to the standard protocol. Multiplanar CT image reconstructions were also generated. COMPARISON:  X-ray 02/10/2021 FINDINGS: Bones/Joint/Cartilage No acute fracture. No dislocation. Mild left hip joint space narrowing. No appreciable hip joint effusion. Pubic symphysis and visualized portion of the left sacroiliac joint are intact without diastasis. Osseous structures appear slightly demineralized. No suspicious lytic or sclerotic bone lesion. Ligaments Suboptimally assessed by CT. Muscles and Tendons No acute musculotendinous abnormality by CT. Soft tissues No soft tissue fluid collection or hematoma. No left inguinal lymphadenopathy. Prominent atherosclerotic vascular calcifications. IMPRESSION: 1. No acute fracture or dislocation of the left hip. 2. Mild left hip osteoarthritis. Electronically Signed   By: Davina Poke D.O.   On: 02/12/2021 08:09   DG Chest Port 1 View  Result Date: 02/09/2021 CLINICAL DATA:  Lethargy yesterday. Syncopal episode today with some confusion. EXAM: PORTABLE CHEST 1 VIEW COMPARISON:  07/08/2020 FINDINGS: Cardiac silhouette is normal in size. No mediastinal or hilar masses. Prominent bronchovascular markings. Additional opacity noted the lung bases, greater on the right, consistent with atelectasis and/or scarring. No convincing pneumonia and no evidence of pulmonary edema. No pleural effusion or pneumothorax. Skeletal structures demineralized, but grossly intact. IMPRESSION: 1. No acute cardiopulmonary disease. Electronically Signed   By: Lajean Manes M.D.   On: 02/09/2021 16:46   DG Foot Complete Right  Result Date: 01/28/2021 CLINICAL DATA:  Pain status post fall EXAM: RIGHT FOOT COMPLETE - 3+ VIEW COMPARISON:  None. FINDINGS: There is an acute oblique minimally displaced fracture through the proximal phalanx of the fourth digit. There is no dislocation. IMPRESSION: Acute oblique  minimally displaced fracture through the proximal phalanx of the fourth digit. Electronically Signed   By: Drinda Holster M.D.   On: 01/28/2021 18:01   DG HIP UNILAT WITH PELVIS 2-3 VIEWS LEFT  Result Date: 02/10/2021 CLINICAL DATA:  Fall, left hip pain. EXAM: DG HIP (WITH OR WITHOUT PELVIS) 2-3V LEFT COMPARISON:  None. FINDINGS: Intramedullary nail fixation of the right femur partially visualized on frontal view. Frontal view of the right hip is  grossly unremarkable. Lumbosacral surgical hardware noted. There is no evidence of left hip fracture or dislocation. Frontal view of the pelvis demonstrates no acute displaced fracture or diastasis. Other than degenerative changes of the lumbar spine, no severe arthropathy. No aggressive bone abnormality. Limited evaluation of the lumbar spine on frontal view due to overlying bowel gas. Extensive vascular calcifications. IMPRESSION: 1. No acute displaced fracture or dislocation. 2. Extensive vascular calcifications. Electronically Signed   By: Iven Finn M.D.   On: 02/10/2021 00:16    DISCHARGE EXAMINATION: Vitals:   02/16/21 1513 02/16/21 1950 02/17/21 0509 02/17/21 0955  BP: 121/67 115/67 113/69 109/68  Pulse: 78 79 61 81  Resp: 16 19 18 18   Temp:  98.1 F (36.7 C) 98 F (36.7 C) 98 F (36.7 C)  TempSrc:   Oral   SpO2: 95% 93% 92% 92%  Weight:      Height:       General appearance: Awake alert.  In no distress Resp: Clear to auscultation bilaterally.  Normal effort Cardio: S1-S2 is normal regular.  No S3-S4.  No rubs murmurs or bruit GI: Abdomen is soft.  Nontender nondistended.  Bowel sounds are present normal.  No masses organomegaly    DISPOSITION: SNF  Discharge Instructions    Call MD for:  difficulty breathing, headache or visual disturbances   Complete by: As directed    Call MD for:  extreme fatigue   Complete by: As directed    Call MD for:  persistant dizziness or light-headedness   Complete by: As directed    Call  MD for:  persistant nausea and vomiting   Complete by: As directed    Call MD for:  severe uncontrolled pain   Complete by: As directed    Call MD for:  temperature >100.4   Complete by: As directed    Diet general   Complete by: As directed    Discharge instructions   Complete by: As directed    Please review instructions on the discharge summary.  You were cared for by a hospitalist during your hospital stay. If you have any questions about your discharge medications or the care you received while you were in the hospital after you are discharged, you can call the unit and asked to speak with the hospitalist on call if the hospitalist that took care of you is not available. Once you are discharged, your primary care physician will handle any further medical issues. Please note that NO REFILLS for any discharge medications will be authorized once you are discharged, as it is imperative that you return to your primary care physician (or establish a relationship with a primary care physician if you do not have one) for your aftercare needs so that they can reassess your need for medications and monitor your lab values. If you do not have a primary care physician, you can call [phone] for a physician referral.   Increase activity slowly   Complete by: As directed         Allergies as of 02/17/2021      Reactions   Atorvastatin Nausea And Vomiting   Levofloxacin Other (See Comments)   Reaction: achilles tendon pain   Omnicef [cefdinir] Diarrhea   Uncontrollable diarrhea   Trazodone And Nefazodone Other (See Comments)   "Felt like I was going to faint"   Sulfa Antibiotics Rash   Sulfonamide Derivatives Rash      Medication List    STOP taking these medications  folic acid 1 MG tablet Commonly known as: FOLVITE   multivitamin with minerals Tabs tablet   potassium chloride 10 MEQ CR capsule Commonly known as: MICRO-K   vitamin B-12 100 MCG tablet Commonly known as:  CYANOCOBALAMIN   Vitamin D 50 MCG (2000 UT) tablet     TAKE these medications   budesonide 3 MG 24 hr capsule Commonly known as: ENTOCORT EC TAKE 3 CAPSULES ONCE DAILY. What changed: See the new instructions.   buPROPion 150 MG 12 hr tablet Commonly known as: WELLBUTRIN SR Take 1 tablet (150 mg total) by mouth daily.   denosumab 60 MG/ML Sosy injection Commonly known as: PROLIA Inject 60 mg into the skin every 6 (six) months.   hyoscyamine 0.125 MG Tbdp disintergrating tablet Commonly known as: ANASPAZ Place 0.125 mg under the tongue every 6 (six) hours as needed ([name] cramps).   LOMOTIL PO Take 1 tablet by mouth 2 (two) times daily as needed (diarrhea).   methocarbamol 500 MG tablet Commonly known as: ROBAXIN Take 1 tablet (500 mg total) by mouth every 6 (six) hours as needed for muscle spasms.   methocarbamol 750 MG tablet Commonly known as: ROBAXIN Take 1 tablet (750 mg total) by mouth 3 (three) times daily.   morphine 15 MG 12 hr tablet Commonly known as: MS CONTIN Take 1 tablet (15 mg total) by mouth 3 (three) times daily.   nicotine 21 mg/24hr patch Commonly known as: NICODERM CQ - dosed in mg/24 hours Place 1 patch (21 mg total) onto the skin daily.   nitroGLYCERIN 0.4 MG SL tablet Commonly known as: NITROSTAT Place 1 tablet (0.4 mg total) under the tongue every 5 (five) minutes as needed for chest pain.   ondansetron 4 MG disintegrating tablet Commonly known as: ZOFRAN-ODT DISSOLVE 1 TABLET ON TONGUE EVERY 8 HOURS AS NEEDED FOR NAUSEA/VOMITING. What changed: See the new instructions.   Oxycodone HCl 10 MG Tabs Take 1 tablet (10 mg total) by mouth 4 (four) times daily as needed. What changed:   when to take this  reasons to take this   pantoprazole 40 MG tablet Commonly known as: PROTONIX TAKE (1) TABLET TWICE A DAY BEFORE MEALS. What changed:   how much to take  how to take this  when to take this  reasons to take this  additional  instructions   rosuvastatin 5 MG tablet Commonly known as: CRESTOR Take 1 tablet (5 mg total) by mouth 3 (three) times a week.   sertraline 100 MG tablet Commonly known as: ZOLOFT Take 2 tablets (200 mg total) by mouth at bedtime.   spironolactone 25 MG tablet Commonly known as: ALDACTONE Take 0.5 tablets (12.5 mg total) by mouth daily. What changed:   when to take this  reasons to take this   valACYclovir 500 MG tablet Commonly known as: VALTREX Take 500 mg by mouth 2 (two) times daily as needed (flare ups).         Follow-up Information    Biagio Borg, MD. Schedule an appointment as soon as possible for a visit in 1 week(s).   Specialties: Internal Medicine, Radiology Contact information: [place] [address] [phone]               TOTAL DISCHARGE TIME: 57 minutes  [place]  [facility] Pager on [email or web address]  02/17/2021, 12:13 PM

## 2021-02-17 NOTE — TOC Progression Note (Addendum)
Transition of Care [facility]) - Progression Note    Patient Details  Name: Denise King MRN: [id number] Date of Birth: Jun 12, 1951  Transition of Care [facility]) CM/SW [place], RN Phone Number: [phone] 02/17/2021, 1:25 PM  Clinical Narrative:    CM called facilities to follow up on bed offers [facility] - no answer, [facility] no answer message sent, [facility] no beds available, [facility] admissions are closed. CM will continue to reach out to other facilities.   1540 Bed search extended, info sent via [place].    Expected Discharge Plan: [place] Barriers to Discharge: Continued Medical Work up  Expected Discharge Plan and Services Expected Discharge Plan: [place] In-house Referral: NA Discharge Planning Services: CM Consult Post Acute Care Choice: [place] Living arrangements for the past 2 months: Single Family Home Expected Discharge Date: 02/17/21               DME Arranged: N/A DME Agency: NA       HH Arranged: Patient Refused [place] Agency: NA         Social Determinants of Health (SDOH) Interventions    Readmission Risk Interventions Readmission Risk Prevention Plan 07/10/2020  Transportation Screening Complete  PCP or Specialist Appt within 3-5 Days Complete  HRI or [place] Complete  Social Work Consult for [place] Planning/Counseling Complete  Palliative Care Screening Not Applicable  Some recent data might be hidden

## 2021-02-17 NOTE — Discharge Summary (Incomplete Revision)
[facility]  Physician Discharge Summary   Patient ID: Denise King MRN: [id number] DOB/AGE: 03-18-1951 70 y.o.  Admit date: 02/09/2021 Discharge date: 02/17/2021  PCP: Biagio Borg, MD  DISCHARGE DIAGNOSES:  Acute metabolic encephalopathy, resolved E. coli UTI with sepsis present on admission Left hip/posterior thigh pain History of COPD Chronic respiratory failure with hypoxia History of GERD History of depression Normocytic anemia Chronic diastolic CHF Chronic pain syndrome History of lymphocytic colitis  RECOMMENDATIONS FOR OUTPATIENT FOLLOW UP: 1. CBC and basic metabolic panel in 1 week 2. Check her TSH and free T4 in 3 to 4 weeks.    Home Health: Going to SNF Equipment/Devices: None  CODE STATUS: Full code  DISCHARGE CONDITION: fair  Diet recommendation: Heart Healthy  INITIAL HISTORY: 70 y.o.femalewith medical history significant forchronic hypoxic respiratory failure on 2 L continuous supplemental oxygen, COPD, chronic pain syndrome on chronic opioid therapy,chronic diastolic heart failure, depression,who was admitted to [facility] on2/13/2022 with altered mental status.  Thought to be secondary to UTI.   HOSPITAL COURSE:    Acute metabolic encephalopathy This has resolved.  Was most likely secondary to urinary tract infection.  CT scan of the head did not show any acute findings.   TSH noted to be 0.175.  Free T4 is noted to be normal at 0.79.  Likely sick euthyroid.    E. coli urinary tract infection with sepsis present on admission/staph hominis bacteremia Initially placed on ceftriaxone.  Based on sensitivities she was changed over to Keflex.    She has completed 7-day treatment. Staph hominis noted on blood cultures.  Likely contaminant.  Left hip/posterior thigh pain No clear etiology for same.  No obvious lesions noted on examination.  CT hip did not show any fractures.  The pain is mostly in the muscle area  posteriorly.  CT did cover the area of concern and did not show any abnormalities.  Straight leg raising test was negative.    Patient was started on Robaxin.  Symptoms were getting better but noted to have more pain this morning.  Patient was told that this might take a while to get better.  Due to persistent symptoms we will proceed with MRI to rule out any concerning findings.   Chronic hypoxic respiratory failure/history of COPD Chronically on 2 L of oxygen.  Respiratory status seems to be stable at this time.  Acute kidney injury Resolved  History of GERD PPI  History of depression Continue with Wellbutrin and Zoloft.  Cymbalta discontinued.  Normocytic anemia Hemoglobin is stable.  No evidence of overt bleeding.  Chronic diastolic CHF Most recent echocardiogram done last July showed normal systolic function.  Appears to be reasonably compensated at this time.    Chronic tobacco abuse She was counseled.  Chronic pain syndrome Patient noted to be on MS Contin which is being continued.  PDMP database was reviewed.  Patient also on oxycodone 10 mg.  Was also on buprenorphine patch but apparently not taking it anymore.  History of lymphocytic colitis On budesonide.  Follows with Dr. Carlean Purl with gastroenterology.   Overall stable.  Okay for discharge to SNF when bed is available.  MRI of the left leg is pending.   PERTINENT LABS:  The results of significant diagnostics from this hospitalization (including imaging, microbiology, ancillary and laboratory) are listed below for reference.    Microbiology: Recent Results (from the past 240 hour(s))  Blood Culture (routine x 2)     Status: Abnormal   Collection  Time: 02/09/21  4:36 PM   Specimen: BLOOD LEFT FOREARM  Result Value Ref Range Status   Specimen Description BLOOD LEFT FOREARM  Final   Special Requests   Final    BOTTLES DRAWN AEROBIC AND ANAEROBIC Blood Culture adequate volume   Culture  Setup Time    Final    GRAM POSITIVE COCCI IN CLUSTERS IN BOTH AEROBIC AND ANAEROBIC BOTTLES CRITICAL RESULT CALLED TO, READ BACK BY AND VERIFIED WITH: A PAYTES [id number] 02/10/21 A BROWNING    Culture (A)  Final    STAPHYLOCOCCUS HOMINIS THE SIGNIFICANCE OF ISOLATING THIS ORGANISM FROM A SINGLE SET OF BLOOD CULTURES WHEN MULTIPLE SETS ARE DRAWN IS UNCERTAIN. PLEASE NOTIFY THE MICROBIOLOGY DEPARTMENT WITHIN ONE WEEK IF SPECIATION AND SENSITIVITIES ARE REQUIRED. Performed at [facility], [place] [address] [place], [place] [address]    Report Status 02/12/2021 FINAL  Final  Resp Panel by RT-PCR (Flu A&B, Covid) Nasopharyngeal Swab     Status: None   Collection Time: 02/09/21  4:36 PM   Specimen: Nasopharyngeal Swab; Nasopharyngeal(NP) swabs in vial transport medium  Result Value Ref Range Status   SARS Coronavirus 2 by RT PCR NEGATIVE NEGATIVE Final    Comment: (NOTE) SARS-CoV-2 target nucleic acids are NOT DETECTED.  The SARS-CoV-2 RNA is generally detectable in upper respiratory specimens during the acute phase of infection. The lowest concentration of SARS-CoV-2 viral copies this assay can detect is 138 copies/mL. A negative result does not preclude SARS-Cov-2 infection and should not be used as the sole basis for treatment or other patient management decisions. A negative result may occur with  improper specimen collection/handling, submission of specimen other than nasopharyngeal swab, presence of viral mutation(s) within the areas targeted by this assay, and inadequate number of viral copies(<138 copies/mL). A negative result must be combined with clinical observations, patient history, and epidemiological information. The expected result is Negative.  Fact Sheet for Patients:  EntrepreneurPulse.com.au  Fact Sheet for Healthcare Providers:  IncredibleEmployment.be  This test is no t yet approved or cleared by the [place] FDA and  has been  authorized for detection and/or diagnosis of SARS-CoV-2 by FDA under an Emergency Use Authorization (EUA). This EUA will remain  in effect (meaning this test can be used) for the duration of the COVID-19 declaration under Section 564(b)(1) of the Act, 21 U.S.C.section 360bbb-3(b)(1), unless the authorization is terminated  or revoked sooner.       Influenza A by PCR NEGATIVE NEGATIVE Final   Influenza B by PCR NEGATIVE NEGATIVE Final    Comment: (NOTE) The Xpert Xpress SARS-CoV-2/FLU/RSV plus assay is intended as an aid in the diagnosis of influenza from Nasopharyngeal swab specimens and should not be used as a sole basis for treatment. Nasal washings and aspirates are unacceptable for Xpert Xpress SARS-CoV-2/FLU/RSV testing.  Fact Sheet for Patients: EntrepreneurPulse.com.au  Fact Sheet for Healthcare Providers: IncredibleEmployment.be  This test is not yet approved or cleared by the [place] FDA and has been authorized for detection and/or diagnosis of SARS-CoV-2 by FDA under an Emergency Use Authorization (EUA). This EUA will remain in effect (meaning this test can be used) for the duration of the COVID-19 declaration under Section 564(b)(1) of the Act, 21 U.S.C. section 360bbb-3(b)(1), unless the authorization is terminated or revoked.  Performed at [facility], [place] [address] [place], [place] [address]   Blood Culture ID Panel (Reflexed)     Status: Abnormal   Collection Time: 02/09/21  4:36 PM  Result Value Ref Range Status   Enterococcus faecalis NOT DETECTED NOT DETECTED Final   Enterococcus Faecium NOT DETECTED NOT DETECTED Final   Listeria monocytogenes NOT DETECTED NOT DETECTED Final   Staphylococcus species DETECTED (A) NOT DETECTED Final    Comment: CRITICAL RESULT CALLED TO, READ BACK BY AND VERIFIED WITH: A PAYTES PHARMD 1425 02/10/21 A BROWNING    Staphylococcus aureus (BCID) NOT DETECTED NOT DETECTED Final    Staphylococcus epidermidis NOT DETECTED NOT DETECTED Final   Staphylococcus lugdunensis NOT DETECTED NOT DETECTED Final   Streptococcus species NOT DETECTED NOT DETECTED Final   Streptococcus agalactiae NOT DETECTED NOT DETECTED Final   Streptococcus pneumoniae NOT DETECTED NOT DETECTED Final   Streptococcus pyogenes NOT DETECTED NOT DETECTED Final   A.calcoaceticus-baumannii NOT DETECTED NOT DETECTED Final   Bacteroides fragilis NOT DETECTED NOT DETECTED Final   Enterobacterales NOT DETECTED NOT DETECTED Final   Enterobacter cloacae complex NOT DETECTED NOT DETECTED Final   Escherichia coli NOT DETECTED NOT DETECTED Final   Klebsiella aerogenes NOT DETECTED NOT DETECTED Final   Klebsiella oxytoca NOT DETECTED NOT DETECTED Final   Klebsiella pneumoniae NOT DETECTED NOT DETECTED Final   Proteus species NOT DETECTED NOT DETECTED Final   Salmonella species NOT DETECTED NOT DETECTED Final   Serratia marcescens NOT DETECTED NOT DETECTED Final   Haemophilus influenzae NOT DETECTED NOT DETECTED Final   Neisseria meningitidis NOT DETECTED NOT DETECTED Final   Pseudomonas aeruginosa NOT DETECTED NOT DETECTED Final   Stenotrophomonas maltophilia NOT DETECTED NOT DETECTED Final   Candida albicans NOT DETECTED NOT DETECTED Final   Candida auris NOT DETECTED NOT DETECTED Final   Candida glabrata NOT DETECTED NOT DETECTED Final   Candida krusei NOT DETECTED NOT DETECTED Final   Candida parapsilosis NOT DETECTED NOT DETECTED Final   Candida tropicalis NOT DETECTED NOT DETECTED Final   Cryptococcus neoformans/gattii NOT DETECTED NOT DETECTED Final    Comment: Performed at [facility], [address], [place], [place] [address]  Blood Culture (routine x 2)     Status: None   Collection Time: 02/09/21  7:35 PM   Specimen: BLOOD  Result Value Ref Range Status   Specimen Description BLOOD SITE NOT SPECIFIED  Final   Special Requests   Final    BOTTLES DRAWN AEROBIC ONLY Blood Culture adequate  volume   Culture   Final    NO GROWTH 5 DAYS Performed at [facility], [address], [place], [place] [address]    Report Status 02/14/2021 FINAL  Final  Urine culture     Status: Abnormal   Collection Time: 02/09/21  9:24 PM   Specimen: In/Out Cath Urine  Result Value Ref Range Status   Specimen Description IN/OUT CATH URINE  Final   Special Requests   Final    NONE Performed at [facility], [place] [address], [place], [place] [address]    Culture >=100,000 COLONIES/mL ESCHERICHIA COLI (A)  Final   Report Status 02/12/2021 FINAL  Final   Organism ID, Bacteria ESCHERICHIA COLI (A)  Final      Susceptibility   Escherichia coli - MIC*    AMPICILLIN 8 SENSITIVE Sensitive     CEFAZOLIN <=4 SENSITIVE Sensitive     CEFEPIME <=0.12 SENSITIVE Sensitive     CEFTRIAXONE <=0.25 SENSITIVE Sensitive     CIPROFLOXACIN <=0.25 SENSITIVE Sensitive     GENTAMICIN <=1 SENSITIVE Sensitive     IMIPENEM <=0.25 SENSITIVE Sensitive     NITROFURANTOIN 32  SENSITIVE Sensitive     TRIMETH/SULFA <=20 SENSITIVE Sensitive     AMPICILLIN/SULBACTAM 4 SENSITIVE Sensitive     PIP/TAZO <=4 SENSITIVE Sensitive     * >=100,000 COLONIES/mL ESCHERICHIA COLI     Labs:  COVID-19 Labs  Lab Results  Component Value Date   SARSCOV2NAA NEGATIVE 02/09/2021   [place] NEGATIVE 07/04/2020   [place] NEGATIVE 02/19/2020   [place] NEGATIVE 02/12/2020      Basic Metabolic Panel: Recent Labs  Lab 02/11/21 0155 02/12/21 0317 02/14/21 0429 02/17/21 0330  NA 136 137 140 136  K 3.8 4.1 4.9 3.7  CL 99 97* 96* 95*  CO2 30 29 33* 31  GLUCOSE 101* 76 99 101*  BUN 7* 7* 7* 10  CREATININE 0.72 0.64 0.78 0.85  CALCIUM 7.9* 8.4* 9.1 8.8*  MG 1.7 2.4  --   --   PHOS 2.7 3.6  --   --    Liver Function Tests: Recent Labs  Lab 02/11/21 0155 02/12/21 0317 02/17/21 0330  AST 13* 13* 15  ALT 11 9 8   ALKPHOS 57 65 74  BILITOT 0.5 0.6 0.3  PROT 5.6* 5.9* 6.1*  ALBUMIN 2.7* 2.9* 3.0*   No  results for input(s): LIPASE, AMYLASE in the last 168 hours. No results for input(s): AMMONIA in the last 168 hours. CBC: Recent Labs  Lab 02/11/21 0155 02/12/21 0317 02/14/21 0429 02/17/21 0330  WBC 10.6* 9.2 8.7 9.4  NEUTROABS 6.4 5.7  --   --   HGB 10.1* 11.6* 11.4* 10.9*  HCT 30.5* 36.5 36.4 32.7*  MCV 95.0 95.1 96.3 94.0  PLT 307 350 366 352   BNP: BNP (last 3 results) Recent Labs    07/08/20 0232 07/09/20 0247 07/10/20 0252  BNP 230.8* 97.5 96.9     IMAGING STUDIES DG Thoracic Spine 2 View  Result Date: 01/28/2021 CLINICAL DATA:  Pain status post fall EXAM: THORACIC SPINE 2 VIEWS COMPARISON:  None. FINDINGS: There is an S-shaped curvature of the thoracolumbar spine. No acute compression fracture. There is osteopenia. Degenerative changes are noted of the thoracic spine. IMPRESSION: Negative. Electronically Signed   By: Trana Holster M.D.   On: 01/28/2021 17:57   DG Lumbar Spine 2-3 Views  Result Date: 01/28/2021 CLINICAL DATA:  Pain status post fall EXAM: LUMBAR SPINE - 2-3 VIEW COMPARISON:  None. FINDINGS: The patient is status post prior L4 through S1 posterior fusion. There is severe disc height loss at the L3-L4 disc space. There is no definite acute compression fracture. Vascular calcifications are noted. IMPRESSION: Negative. Electronically Signed   By: Nyima Holster M.D.   On: 01/28/2021 17:55   DG Sacrum/Coccyx  Result Date: 01/28/2021 CLINICAL DATA:  70 year old female with fall and pain in the tailbone. EXAM: SACRUM AND COCCYX - 2+ VIEW COMPARISON:  CT abdomen pelvis dated 06/11/2020. FINDINGS: No acute fracture. Probable old healed fracture of the mid sacrum. The bones are osteopenic. L4-S1 posterior fusion. Partially visualized right femoral hardware. The soft tissues are unremarkable. IMPRESSION: No acute fracture. Electronically Signed   By: Anner Crete M.D.   On: 01/28/2021 21:13   CT HEAD WO CONTRAST  Result Date: 02/11/2021 CLINICAL DATA:   Encephalopathy EXAM: CT HEAD WITHOUT CONTRAST TECHNIQUE: Contiguous axial images were obtained from the base of the skull through the vertex without intravenous contrast. COMPARISON:  01/28/2021 FINDINGS: Brain: There is no mass, hemorrhage or extra-axial collection. The size and configuration of the ventricles and extra-axial CSF spaces are normal. There is hypoattenuation of  the white matter, most commonly indicating chronic small vessel disease. Vascular: Atherosclerotic calcification of the internal carotid arteries at the skull base. No abnormal hyperdensity of the major intracranial arteries or dural venous sinuses. Skull: The visualized skull base, calvarium and extracranial soft tissues are normal. Sinuses/Orbits: No fluid levels or advanced mucosal thickening of the visualized paranasal sinuses. No mastoid or middle ear effusion. The orbits are normal. IMPRESSION: Chronic small vessel disease without acute intracranial abnormality. Electronically Signed   By: Ulyses Jarred M.D.   On: 02/11/2021 21:54   CT Head Wo Contrast  Result Date: 01/28/2021 CLINICAL DATA:  Fall, hit back of head EXAM: CT HEAD WITHOUT CONTRAST TECHNIQUE: Contiguous axial images were obtained from the base of the skull through the vertex without intravenous contrast. COMPARISON:  07/04/2020 FINDINGS: Brain: There is no acute intracranial hemorrhage, mass effect, or edema. Gray-white differentiation is preserved. There is no extra-axial fluid collection. Ventricles and sulci are within normal limits in size and configuration. Vascular: There is atherosclerotic calcification at the skull base. Skull: Calvarium is unremarkable. Sinuses/Orbits: No acute finding. Other: None. IMPRESSION: No evidence of acute traumatic injury. Electronically Signed   By: Macy Mis M.D.   On: 01/28/2021 21:23   CT HIP LEFT WO CONTRAST  Result Date: 02/12/2021 CLINICAL DATA:  Left hip pain after fall on 02/08/2021 EXAM: CT OF THE LEFT HIP WITHOUT  CONTRAST TECHNIQUE: Multidetector CT imaging of the left hip was performed according to the standard protocol. Multiplanar CT image reconstructions were also generated. COMPARISON:  X-ray 02/10/2021 FINDINGS: Bones/Joint/Cartilage No acute fracture. No dislocation. Mild left hip joint space narrowing. No appreciable hip joint effusion. Pubic symphysis and visualized portion of the left sacroiliac joint are intact without diastasis. Osseous structures appear slightly demineralized. No suspicious lytic or sclerotic bone lesion. Ligaments Suboptimally assessed by CT. Muscles and Tendons No acute musculotendinous abnormality by CT. Soft tissues No soft tissue fluid collection or hematoma. No left inguinal lymphadenopathy. Prominent atherosclerotic vascular calcifications. IMPRESSION: 1. No acute fracture or dislocation of the left hip. 2. Mild left hip osteoarthritis. Electronically Signed   By: Davina Poke D.O.   On: 02/12/2021 08:09   DG Chest Port 1 View  Result Date: 02/09/2021 CLINICAL DATA:  Lethargy yesterday. Syncopal episode today with some confusion. EXAM: PORTABLE CHEST 1 VIEW COMPARISON:  07/08/2020 FINDINGS: Cardiac silhouette is normal in size. No mediastinal or hilar masses. Prominent bronchovascular markings. Additional opacity noted the lung bases, greater on the right, consistent with atelectasis and/or scarring. No convincing pneumonia and no evidence of pulmonary edema. No pleural effusion or pneumothorax. Skeletal structures demineralized, but grossly intact. IMPRESSION: 1. No acute cardiopulmonary disease. Electronically Signed   By: Lajean Manes M.D.   On: 02/09/2021 16:46   DG Foot Complete Right  Result Date: 01/28/2021 CLINICAL DATA:  Pain status post fall EXAM: RIGHT FOOT COMPLETE - 3+ VIEW COMPARISON:  None. FINDINGS: There is an acute oblique minimally displaced fracture through the proximal phalanx of the fourth digit. There is no dislocation. IMPRESSION: Acute oblique  minimally displaced fracture through the proximal phalanx of the fourth digit. Electronically Signed   By: Hadlei Holster M.D.   On: 01/28/2021 18:01   DG HIP UNILAT WITH PELVIS 2-3 VIEWS LEFT  Result Date: 02/10/2021 CLINICAL DATA:  Fall, left hip pain. EXAM: DG HIP (WITH OR WITHOUT PELVIS) 2-3V LEFT COMPARISON:  None. FINDINGS: Intramedullary nail fixation of the right femur partially visualized on frontal view. Frontal view of the right hip is  grossly unremarkable. Lumbosacral surgical hardware noted. There is no evidence of left hip fracture or dislocation. Frontal view of the pelvis demonstrates no acute displaced fracture or diastasis. Other than degenerative changes of the lumbar spine, no severe arthropathy. No aggressive bone abnormality. Limited evaluation of the lumbar spine on frontal view due to overlying bowel gas. Extensive vascular calcifications. IMPRESSION: 1. No acute displaced fracture or dislocation. 2. Extensive vascular calcifications. Electronically Signed   By: Iven Finn M.D.   On: 02/10/2021 00:16    DISCHARGE EXAMINATION: Vitals:   02/16/21 1513 02/16/21 1950 02/17/21 0509 02/17/21 0955  BP: 121/67 115/67 113/69 109/68  Pulse: 78 79 61 81  Resp: 16 19 18 18   Temp:  98.1 F (36.7 C) 98 F (36.7 C) 98 F (36.7 C)  TempSrc:   Oral   SpO2: 95% 93% 92% 92%  Weight:      Height:       General appearance: Awake alert.  In no distress Resp: Clear to auscultation bilaterally.  Normal effort Cardio: S1-S2 is normal regular.  No S3-S4.  No rubs murmurs or bruit GI: Abdomen is soft.  Nontender nondistended.  Bowel sounds are present normal.  No masses organomegaly    DISPOSITION: SNF  Discharge Instructions    Call MD for:  difficulty breathing, headache or visual disturbances   Complete by: As directed    Call MD for:  extreme fatigue   Complete by: As directed    Call MD for:  persistant dizziness or light-headedness   Complete by: As directed    Call  MD for:  persistant nausea and vomiting   Complete by: As directed    Call MD for:  severe uncontrolled pain   Complete by: As directed    Call MD for:  temperature >100.4   Complete by: As directed    Diet general   Complete by: As directed    Discharge instructions   Complete by: As directed    Please review instructions on the discharge summary.  You were cared for by a hospitalist during your hospital stay. If you have any questions about your discharge medications or the care you received while you were in the hospital after you are discharged, you can call the unit and asked to speak with the hospitalist on call if the hospitalist that took care of you is not available. Once you are discharged, your primary care physician will handle any further medical issues. Please note that NO REFILLS for any discharge medications will be authorized once you are discharged, as it is imperative that you return to your primary care physician (or establish a relationship with a primary care physician if you do not have one) for your aftercare needs so that they can reassess your need for medications and monitor your lab values. If you do not have a primary care physician, you can call [phone] for a physician referral.   Increase activity slowly   Complete by: As directed         Allergies as of 02/17/2021      Reactions   Atorvastatin Nausea And Vomiting   Levofloxacin Other (See Comments)   Reaction: achilles tendon pain   Omnicef [cefdinir] Diarrhea   Uncontrollable diarrhea   Trazodone And Nefazodone Other (See Comments)   "Felt like I was going to faint"   Sulfa Antibiotics Rash   Sulfonamide Derivatives Rash      Medication List    STOP taking these medications  folic acid 1 MG tablet Commonly known as: FOLVITE   multivitamin with minerals Tabs tablet   potassium chloride 10 MEQ CR capsule Commonly known as: MICRO-K   vitamin B-12 100 MCG tablet Commonly known as:  CYANOCOBALAMIN   Vitamin D 50 MCG (2000 UT) tablet     TAKE these medications   budesonide 3 MG 24 hr capsule Commonly known as: ENTOCORT EC TAKE 3 CAPSULES ONCE DAILY. What changed: See the new instructions.   buPROPion 150 MG 12 hr tablet Commonly known as: WELLBUTRIN SR Take 1 tablet (150 mg total) by mouth daily.   denosumab 60 MG/ML Sosy injection Commonly known as: PROLIA Inject 60 mg into the skin every 6 (six) months.   hyoscyamine 0.125 MG Tbdp disintergrating tablet Commonly known as: ANASPAZ Place 0.125 mg under the tongue every 6 (six) hours as needed ([name] cramps).   LOMOTIL PO Take 1 tablet by mouth 2 (two) times daily as needed (diarrhea).   methocarbamol 500 MG tablet Commonly known as: ROBAXIN Take 1 tablet (500 mg total) by mouth every 6 (six) hours as needed for muscle spasms.   methocarbamol 750 MG tablet Commonly known as: ROBAXIN Take 1 tablet (750 mg total) by mouth 3 (three) times daily.   morphine 15 MG 12 hr tablet Commonly known as: MS CONTIN Take 1 tablet (15 mg total) by mouth 3 (three) times daily.   nicotine 21 mg/24hr patch Commonly known as: NICODERM CQ - dosed in mg/24 hours Place 1 patch (21 mg total) onto the skin daily.   nitroGLYCERIN 0.4 MG SL tablet Commonly known as: NITROSTAT Place 1 tablet (0.4 mg total) under the tongue every 5 (five) minutes as needed for chest pain.   ondansetron 4 MG disintegrating tablet Commonly known as: ZOFRAN-ODT DISSOLVE 1 TABLET ON TONGUE EVERY 8 HOURS AS NEEDED FOR NAUSEA/VOMITING. What changed: See the new instructions.   Oxycodone HCl 10 MG Tabs Take 1 tablet (10 mg total) by mouth 4 (four) times daily as needed. What changed:   when to take this  reasons to take this   pantoprazole 40 MG tablet Commonly known as: PROTONIX TAKE (1) TABLET TWICE A DAY BEFORE MEALS. What changed:   how much to take  how to take this  when to take this  reasons to take this  additional  instructions   rosuvastatin 5 MG tablet Commonly known as: CRESTOR Take 1 tablet (5 mg total) by mouth 3 (three) times a week.   sertraline 100 MG tablet Commonly known as: ZOLOFT Take 2 tablets (200 mg total) by mouth at bedtime.   spironolactone 25 MG tablet Commonly known as: ALDACTONE Take 0.5 tablets (12.5 mg total) by mouth daily. What changed:   when to take this  reasons to take this   valACYclovir 500 MG tablet Commonly known as: VALTREX Take 500 mg by mouth 2 (two) times daily as needed (flare ups).         Follow-up Information    Biagio Borg, MD. Schedule an appointment as soon as possible for a visit in 1 week(s).   Specialties: Internal Medicine, Radiology Contact information: [place] [address] [phone]               TOTAL DISCHARGE TIME: 50 minutes  [place]  [facility] Pager on [email or web address]  02/17/2021, 12:13 PM

## 2021-02-17 NOTE — Progress Notes (Addendum)
OT Cancellation Note  Patient Details Name: Denise King MRN: [id number] DOB: 1951-05-18   Cancelled Treatment:    Reason Eval/Treat Not Completed: Pain limiting ability to participate. Attempted OT session at 10AM after pt given morphine around 9AM. Pt reports morphine did not help much with the pain and planned to call RN for additional pain meds. Will follow-up for OT session again as time allows.   Addendum: Attempted OT session this afternoon, but pt reports just finishing with PT and fatigued from session. Will follow-up tomorrow as able.   Layla Maw 02/17/2021, 10:54 AM

## 2021-02-18 NOTE — Discharge Summary (Signed)
[facility]  Physician Discharge Summary   Patient ID: Denise King MRN: [id number] DOB/AGE: 68-Jul-1952 68 y.o.  Admit date: 02/09/2021 Discharge date: 02/18/2021  PCP: Biagio Borg, MD  DISCHARGE DIAGNOSES:  Acute metabolic encephalopathy, resolved E. coli UTI with sepsis present on admission Left hip/posterior thigh pain History of COPD Chronic respiratory failure with hypoxia History of GERD History of depression Normocytic anemia Chronic diastolic CHF Chronic pain syndrome History of lymphocytic colitis  RECOMMENDATIONS FOR OUTPATIENT FOLLOW UP: 1. CBC and basic metabolic panel in 1 week 2. Check her TSH and free T4 in 3 to 4 weeks.    Home Health: Going to SNF Equipment/Devices: None  CODE STATUS: Full code  DISCHARGE CONDITION: fair  Diet recommendation: Heart Healthy  INITIAL HISTORY: 70 y.o.femalewith medical history significant forchronic hypoxic respiratory failure on 2 L continuous supplemental oxygen, COPD, chronic pain syndrome on chronic opioid therapy,chronic diastolic heart failure, depression,who was admitted to [facility] on2/13/2022 with altered mental status.  Thought to be secondary to UTI.   HOSPITAL COURSE:    Acute metabolic encephalopathy This has resolved.  Was most likely secondary to urinary tract infection.  CT scan of the head did not show any acute findings.   TSH noted to be 0.175.  Free T4 is noted to be normal at 0.79.  Likely sick euthyroid.    E. coli urinary tract infection with sepsis present on admission/staph hominis bacteremia Initially placed on ceftriaxone.  Based on sensitivities she was changed over to Keflex.    She has completed 7-day treatment. Staph hominis noted on blood cultures.  Likely contaminant.  Left hip/posterior thigh pain/Fracture of sacral ala No clear etiology for same.  No obvious lesions noted on examination.  CT hip did not show any fractures.  The pain is mostly in  the muscle area posteriorly.  CT did cover the area of concern and did not show any abnormalities.  Straight leg raising test was negative.    Patient was started on Robaxin.   Symptoms were getting better however patient was noted to have more pain yesterday.  She initially could not work with therapy.  So MRI was subsequently ordered which showed does show evidence for muscle strain in that area which is the most likely reason for her pain.  Patient was reassured.  Continue with muscle relaxants and pain medications.  Sacral ala fractures were noted which were thought to be acute to subacute.  No displacement mention.  No surgical intervention is necessary for this condition at this time.  Continue with PT and OT.  Rehab.    Chronic hypoxic respiratory failure/history of COPD Chronically on 2 L of oxygen.  Respiratory status seems to be stable at this time.  Acute kidney injury Resolved  History of GERD PPI  History of depression Continue with Wellbutrin and Zoloft.  Cymbalta discontinued.  Normocytic anemia Hemoglobin is stable.  No evidence of overt bleeding.  Chronic diastolic CHF Most recent echocardiogram done last July showed normal systolic function.  Appears to be reasonably compensated at this time.    Chronic tobacco abuse She was counseled.  Chronic pain syndrome Patient noted to be on MS Contin which is being continued.  PDMP database was reviewed.  Patient also on oxycodone 10 mg.  Was also on buprenorphine patch but apparently not taking it anymore.  History of lymphocytic colitis On budesonide.  Follows with Dr. Carlean Purl with gastroenterology.   Patient remains stable.  Okay for discharge to SNF  when bed is available.   PERTINENT LABS:  The results of significant diagnostics from this hospitalization (including imaging, microbiology, ancillary and laboratory) are listed below for reference.    Microbiology: Recent Results (from the past 240 hour(s))   Blood Culture (routine x 2)     Status: Abnormal   Collection Time: 02/09/21  4:36 PM   Specimen: BLOOD LEFT FOREARM  Result Value Ref Range Status   Specimen Description BLOOD LEFT FOREARM  Final   Special Requests   Final    BOTTLES DRAWN AEROBIC AND ANAEROBIC Blood Culture adequate volume   Culture  Setup Time   Final    GRAM POSITIVE COCCI IN CLUSTERS IN BOTH AEROBIC AND ANAEROBIC BOTTLES CRITICAL RESULT CALLED TO, READ BACK BY AND VERIFIED WITH: A PAYTES [id number] 02/10/21 A BROWNING    Culture (A)  Final    STAPHYLOCOCCUS HOMINIS THE SIGNIFICANCE OF ISOLATING THIS ORGANISM FROM A SINGLE SET OF BLOOD CULTURES WHEN MULTIPLE SETS ARE DRAWN IS UNCERTAIN. PLEASE NOTIFY THE MICROBIOLOGY DEPARTMENT WITHIN ONE WEEK IF SPECIATION AND SENSITIVITIES ARE REQUIRED. Performed at [facility], [place] [address] [place], [place] [address]    Report Status 02/12/2021 FINAL  Final  Resp Panel by RT-PCR (Flu A&B, Covid) Nasopharyngeal Swab     Status: None   Collection Time: 02/09/21  4:36 PM   Specimen: Nasopharyngeal Swab; Nasopharyngeal(NP) swabs in vial transport medium  Result Value Ref Range Status   SARS Coronavirus 2 by RT PCR NEGATIVE NEGATIVE Final    Comment: (NOTE) SARS-CoV-2 target nucleic acids are NOT DETECTED.  The SARS-CoV-2 RNA is generally detectable in upper respiratory specimens during the acute phase of infection. The lowest concentration of SARS-CoV-2 viral copies this assay can detect is 138 copies/mL. A negative result does not preclude SARS-Cov-2 infection and should not be used as the sole basis for treatment or other patient management decisions. A negative result may occur with  improper specimen collection/handling, submission of specimen other than nasopharyngeal swab, presence of viral mutation(s) within the areas targeted by this assay, and inadequate number of viral copies(<138 copies/mL). A negative result must be combined with clinical observations,  patient history, and epidemiological information. The expected result is Negative.  Fact Sheet for Patients:  EntrepreneurPulse.com.au  Fact Sheet for Healthcare Providers:  IncredibleEmployment.be  This test is no t yet approved or cleared by the [place] FDA and  has been authorized for detection and/or diagnosis of SARS-CoV-2 by FDA under an Emergency Use Authorization (EUA). This EUA will remain  in effect (meaning this test can be used) for the duration of the COVID-19 declaration under Section 564(b)(1) of the Act, 21 U.S.C.section 360bbb-3(b)(1), unless the authorization is terminated  or revoked sooner.       Influenza A by PCR NEGATIVE NEGATIVE Final   Influenza B by PCR NEGATIVE NEGATIVE Final    Comment: (NOTE) The Xpert Xpress SARS-CoV-2/FLU/RSV plus assay is intended as an aid in the diagnosis of influenza from Nasopharyngeal swab specimens and should not be used as a sole basis for treatment. Nasal washings and aspirates are unacceptable for Xpert Xpress SARS-CoV-2/FLU/RSV testing.  Fact Sheet for Patients: EntrepreneurPulse.com.au  Fact Sheet for Healthcare Providers: IncredibleEmployment.be  This test is not yet approved or cleared by the [place] FDA and has been authorized for detection and/or diagnosis of SARS-CoV-2 by FDA under an Emergency Use Authorization (EUA). This EUA will remain in effect (meaning this test can be used) for the duration of the  COVID-19 declaration under Section 564(b)(1) of the Act, 21 U.S.C. section 360bbb-3(b)(1), unless the authorization is terminated or revoked.  Performed at [facility], [place] [address] [place], [place] [address]   Blood Culture ID Panel (Reflexed)     Status: Abnormal   Collection Time: 02/09/21  4:36 PM  Result Value Ref Range Status   Enterococcus faecalis NOT DETECTED NOT DETECTED Final   Enterococcus Faecium NOT  DETECTED NOT DETECTED Final   Listeria monocytogenes NOT DETECTED NOT DETECTED Final   Staphylococcus species DETECTED (A) NOT DETECTED Final    Comment: CRITICAL RESULT CALLED TO, READ BACK BY AND VERIFIED WITH: A PAYTES PHARMD 1425 02/10/21 A BROWNING    Staphylococcus aureus (BCID) NOT DETECTED NOT DETECTED Final   Staphylococcus epidermidis NOT DETECTED NOT DETECTED Final   Staphylococcus lugdunensis NOT DETECTED NOT DETECTED Final   Streptococcus species NOT DETECTED NOT DETECTED Final   Streptococcus agalactiae NOT DETECTED NOT DETECTED Final   Streptococcus pneumoniae NOT DETECTED NOT DETECTED Final   Streptococcus pyogenes NOT DETECTED NOT DETECTED Final   A.calcoaceticus-baumannii NOT DETECTED NOT DETECTED Final   Bacteroides fragilis NOT DETECTED NOT DETECTED Final   Enterobacterales NOT DETECTED NOT DETECTED Final   Enterobacter cloacae complex NOT DETECTED NOT DETECTED Final   Escherichia coli NOT DETECTED NOT DETECTED Final   Klebsiella aerogenes NOT DETECTED NOT DETECTED Final   Klebsiella oxytoca NOT DETECTED NOT DETECTED Final   Klebsiella pneumoniae NOT DETECTED NOT DETECTED Final   Proteus species NOT DETECTED NOT DETECTED Final   Salmonella species NOT DETECTED NOT DETECTED Final   Serratia marcescens NOT DETECTED NOT DETECTED Final   Haemophilus influenzae NOT DETECTED NOT DETECTED Final   Neisseria meningitidis NOT DETECTED NOT DETECTED Final   Pseudomonas aeruginosa NOT DETECTED NOT DETECTED Final   Stenotrophomonas maltophilia NOT DETECTED NOT DETECTED Final   Candida albicans NOT DETECTED NOT DETECTED Final   Candida auris NOT DETECTED NOT DETECTED Final   Candida glabrata NOT DETECTED NOT DETECTED Final   Candida krusei NOT DETECTED NOT DETECTED Final   Candida parapsilosis NOT DETECTED NOT DETECTED Final   Candida tropicalis NOT DETECTED NOT DETECTED Final   Cryptococcus neoformans/gattii NOT DETECTED NOT DETECTED Final    Comment: Performed at [facility], [address], [place], [place] [address]  Blood Culture (routine x 2)     Status: None   Collection Time: 02/09/21  7:35 PM   Specimen: BLOOD  Result Value Ref Range Status   Specimen Description BLOOD SITE NOT SPECIFIED  Final   Special Requests   Final    BOTTLES DRAWN AEROBIC ONLY Blood Culture adequate volume   Culture   Final    NO GROWTH 5 DAYS Performed at [facility], [address] [place], [place] [address]    Report Status 02/14/2021 FINAL  Final  Urine culture     Status: Abnormal   Collection Time: 02/09/21  9:24 PM   Specimen: In/Out Cath Urine  Result Value Ref Range Status   Specimen Description IN/OUT CATH URINE  Final   Special Requests   Final    NONE Performed at [facility], [place] [address] [place], [place] [address]    Culture >=100,000 COLONIES/mL ESCHERICHIA COLI (A)  Final   Report Status 02/12/2021 FINAL  Final   Organism ID, Bacteria ESCHERICHIA COLI (A)  Final      Susceptibility   Escherichia coli - MIC*    AMPICILLIN 8 SENSITIVE  Sensitive     CEFAZOLIN <=4 SENSITIVE Sensitive     CEFEPIME <=0.12 SENSITIVE Sensitive     CEFTRIAXONE <=0.25 SENSITIVE Sensitive     CIPROFLOXACIN <=0.25 SENSITIVE Sensitive     GENTAMICIN <=1 SENSITIVE Sensitive     IMIPENEM <=0.25 SENSITIVE Sensitive     NITROFURANTOIN 32 SENSITIVE Sensitive     TRIMETH/SULFA <=20 SENSITIVE Sensitive     AMPICILLIN/SULBACTAM 4 SENSITIVE Sensitive     PIP/TAZO <=4 SENSITIVE Sensitive     * >=100,000 COLONIES/mL ESCHERICHIA COLI     Labs:  COVID-19 Labs  Lab Results  Component Value Date   SARSCOV2NAA NEGATIVE 02/09/2021   [place] NEGATIVE 07/04/2020   [place] NEGATIVE 02/19/2020   [place] NEGATIVE 02/12/2020      Basic Metabolic Panel: Recent Labs  Lab 02/12/21 0317 02/14/21 0429 02/17/21 0330  NA 137 140 136  K 4.1 4.9 3.7  CL 97* 96* 95*  CO2 29 33* 31  GLUCOSE 76 99 101*  BUN 7* 7* 10  CREATININE 0.64 0.78 0.85   CALCIUM 8.4* 9.1 8.8*  MG 2.4  --   --   PHOS 3.6  --   --    Liver Function Tests: Recent Labs  Lab 02/12/21 0317 02/17/21 0330  AST 13* 15  ALT 9 8  ALKPHOS 65 74  BILITOT 0.6 0.3  PROT 5.9* 6.1*  ALBUMIN 2.9* 3.0*   CBC: Recent Labs  Lab 02/12/21 0317 02/14/21 0429 02/17/21 0330  WBC 9.2 8.7 9.4  NEUTROABS 5.7  --   --   HGB 11.6* 11.4* 10.9*  HCT 36.5 36.4 32.7*  MCV 95.1 96.3 94.0  PLT 350 366 352   BNP: BNP (last 3 results) Recent Labs    07/08/20 0232 07/09/20 0247 07/10/20 0252  BNP 230.8* 97.5 96.9     IMAGING STUDIES DG Thoracic Spine 2 View  Result Date: 01/28/2021 CLINICAL DATA:  Pain status post fall EXAM: THORACIC SPINE 2 VIEWS COMPARISON:  None. FINDINGS: There is an S-shaped curvature of the thoracolumbar spine. No acute compression fracture. There is osteopenia. Degenerative changes are noted of the thoracic spine. IMPRESSION: Negative. Electronically Signed   By: Kyliana Holster M.D.   On: 01/28/2021 17:57   DG Lumbar Spine 2-3 Views  Result Date: 01/28/2021 CLINICAL DATA:  Pain status post fall EXAM: LUMBAR SPINE - 2-3 VIEW COMPARISON:  None. FINDINGS: The patient is status post prior L4 through S1 posterior fusion. There is severe disc height loss at the L3-L4 disc space. There is no definite acute compression fracture. Vascular calcifications are noted. IMPRESSION: Negative. Electronically Signed   By: Nadina Holster M.D.   On: 01/28/2021 17:55   DG Sacrum/Coccyx  Result Date: 01/28/2021 CLINICAL DATA:  70 year old female with fall and pain in the tailbone. EXAM: SACRUM AND COCCYX - 2+ VIEW COMPARISON:  CT abdomen pelvis dated 06/11/2020. FINDINGS: No acute fracture. Probable old healed fracture of the mid sacrum. The bones are osteopenic. L4-S1 posterior fusion. Partially visualized right femoral hardware. The soft tissues are unremarkable. IMPRESSION: No acute fracture. Electronically Signed   By: Anner Crete M.D.   On: 01/28/2021  21:13   CT HEAD WO CONTRAST  Result Date: 02/11/2021 CLINICAL DATA:  Encephalopathy EXAM: CT HEAD WITHOUT CONTRAST TECHNIQUE: Contiguous axial images were obtained from the base of the skull through the vertex without intravenous contrast. COMPARISON:  01/28/2021 FINDINGS: Brain: There is no mass, hemorrhage or extra-axial collection. The size and configuration of the ventricles and extra-axial CSF spaces  are normal. There is hypoattenuation of the white matter, most commonly indicating chronic small vessel disease. Vascular: Atherosclerotic calcification of the internal carotid arteries at the skull base. No abnormal hyperdensity of the major intracranial arteries or dural venous sinuses. Skull: The visualized skull base, calvarium and extracranial soft tissues are normal. Sinuses/Orbits: No fluid levels or advanced mucosal thickening of the visualized paranasal sinuses. No mastoid or middle ear effusion. The orbits are normal. IMPRESSION: Chronic small vessel disease without acute intracranial abnormality. Electronically Signed   By: Ulyses Jarred M.D.   On: 02/11/2021 21:54   CT Head Wo Contrast  Result Date: 01/28/2021 CLINICAL DATA:  Fall, hit back of head EXAM: CT HEAD WITHOUT CONTRAST TECHNIQUE: Contiguous axial images were obtained from the base of the skull through the vertex without intravenous contrast. COMPARISON:  07/04/2020 FINDINGS: Brain: There is no acute intracranial hemorrhage, mass effect, or edema. Gray-white differentiation is preserved. There is no extra-axial fluid collection. Ventricles and sulci are within normal limits in size and configuration. Vascular: There is atherosclerotic calcification at the skull base. Skull: Calvarium is unremarkable. Sinuses/Orbits: No acute finding. Other: None. IMPRESSION: No evidence of acute traumatic injury. Electronically Signed   By: Macy Mis M.D.   On: 01/28/2021 21:23   CT HIP LEFT WO CONTRAST  Result Date: 02/12/2021 CLINICAL DATA:   Left hip pain after fall on 02/08/2021 EXAM: CT OF THE LEFT HIP WITHOUT CONTRAST TECHNIQUE: Multidetector CT imaging of the left hip was performed according to the standard protocol. Multiplanar CT image reconstructions were also generated. COMPARISON:  X-ray 02/10/2021 FINDINGS: Bones/Joint/Cartilage No acute fracture. No dislocation. Mild left hip joint space narrowing. No appreciable hip joint effusion. Pubic symphysis and visualized portion of the left sacroiliac joint are intact without diastasis. Osseous structures appear slightly demineralized. No suspicious lytic or sclerotic bone lesion. Ligaments Suboptimally assessed by CT. Muscles and Tendons No acute musculotendinous abnormality by CT. Soft tissues No soft tissue fluid collection or hematoma. No left inguinal lymphadenopathy. Prominent atherosclerotic vascular calcifications. IMPRESSION: 1. No acute fracture or dislocation of the left hip. 2. Mild left hip osteoarthritis. Electronically Signed   By: Davina Poke D.O.   On: 02/12/2021 08:09   MR FEMUR LEFT WO CONTRAST  Result Date: 02/18/2021 CLINICAL DATA:  Posterior left thigh pain. Limping. Fall on 02/08/2021 EXAM: MR OF THE LEFT FEMUR WITHOUT CONTRAST TECHNIQUE: Multiplanar, multisequence MR imaging of the left femur was performed. No intravenous contrast was administered. COMPARISON:  CT left hip 02/11/2021 FINDINGS: Technical note: Patient was unable to tolerate further imaging and the postcontrast portion of the exam was not performed. Bones/Joint/Cartilage Partially visualized at the edge of the field of view on full-field coronal images are acute to subacute bilateral sacral alar fractures (series 20, images 5-9) with surrounding bone marrow edema. The visualized SI joints are intact without diastasis. No SI joint effusion. Pubic symphysis intact. No acute fracture or dislocation of the left femur. Negative for left femoral head avascular necrosis. No significant left hip joint effusion.  Mild left hip osteoarthritis. Prior ORIF of the proximal right femur without evidence of hardware complication. No suspicious marrow replacing bone lesion. Ligaments Intact. Muscles and Tendons Tendinous structures appear intact without significant tendinosis or tear. No bursal fluid collections. Mild intramuscular edema within the left adductor compartment including the adductor longus and brevis muscles, which may reflect a mild muscle strain. No evidence of muscle tear or intramuscular fluid collection. Soft tissues No soft tissue edema or fluid collection. No left  inguinal lymphadenopathy. IMPRESSION: 1. Acute-to-subacute bilateral sacral alar fractures. 2. Mild intramuscular edema within the left adductor compartment, which may reflect a mild muscle strain. 3. Mild left hip osteoarthritis. Electronically Signed   By: Davina Poke D.O.   On: 02/18/2021 08:09   DG Chest Port 1 View  Result Date: 02/09/2021 CLINICAL DATA:  Lethargy yesterday. Syncopal episode today with some confusion. EXAM: PORTABLE CHEST 1 VIEW COMPARISON:  07/08/2020 FINDINGS: Cardiac silhouette is normal in size. No mediastinal or hilar masses. Prominent bronchovascular markings. Additional opacity noted the lung bases, greater on the right, consistent with atelectasis and/or scarring. No convincing pneumonia and no evidence of pulmonary edema. No pleural effusion or pneumothorax. Skeletal structures demineralized, but grossly intact. IMPRESSION: 1. No acute cardiopulmonary disease. Electronically Signed   By: Lajean Manes M.D.   On: 02/09/2021 16:46   DG Foot Complete Right  Result Date: 01/28/2021 CLINICAL DATA:  Pain status post fall EXAM: RIGHT FOOT COMPLETE - 3+ VIEW COMPARISON:  None. FINDINGS: There is an acute oblique minimally displaced fracture through the proximal phalanx of the fourth digit. There is no dislocation. IMPRESSION: Acute oblique minimally displaced fracture through the proximal phalanx of the fourth digit.  Electronically Signed   By: Anetha Holster M.D.   On: 01/28/2021 18:01   DG HIP UNILAT WITH PELVIS 2-3 VIEWS LEFT  Result Date: 02/10/2021 CLINICAL DATA:  Fall, left hip pain. EXAM: DG HIP (WITH OR WITHOUT PELVIS) 2-3V LEFT COMPARISON:  None. FINDINGS: Intramedullary nail fixation of the right femur partially visualized on frontal view. Frontal view of the right hip is grossly unremarkable. Lumbosacral surgical hardware noted. There is no evidence of left hip fracture or dislocation. Frontal view of the pelvis demonstrates no acute displaced fracture or diastasis. Other than degenerative changes of the lumbar spine, no severe arthropathy. No aggressive bone abnormality. Limited evaluation of the lumbar spine on frontal view due to overlying bowel gas. Extensive vascular calcifications. IMPRESSION: 1. No acute displaced fracture or dislocation. 2. Extensive vascular calcifications. Electronically Signed   By: Iven Finn M.D.   On: 02/10/2021 00:16    DISCHARGE EXAMINATION: Vitals:   02/17/21 0955 02/17/21 2017 02/18/21 0510 02/18/21 0905  BP: 109/68 118/62 114/78 140/81  Pulse: 81 80 76 74  Resp: 18 20 18 19   Temp: 98 F (36.7 C) 98.5 F (36.9 C) 98.1 F (36.7 C) 98.3 F (36.8 C)  TempSrc:  Oral Oral   SpO2: 92% 91% 92% 94%  Weight:      Height:        General appearance: Awake alert.  In no distress Resp: Clear to auscultation bilaterally.  Normal effort Cardio: S1-S2 is normal regular.  No S3-S4.  No rubs murmurs or bruit GI: Abdomen is soft.  Nontender nondistended.  Bowel sounds are present normal.  No masses organomegaly Extremities: No edema.  Limited mobility of the left lower extremity due to muscle strain Neurologic: Alert and oriented x3.  No focal neurological deficits.      DISPOSITION: SNF  Discharge Instructions    Call MD for:  difficulty breathing, headache or visual disturbances   Complete by: As directed    Call MD for:  extreme fatigue   Complete by:  As directed    Call MD for:  persistant dizziness or light-headedness   Complete by: As directed    Call MD for:  persistant nausea and vomiting   Complete by: As directed    Call MD for:  severe uncontrolled pain  Complete by: As directed    Call MD for:  temperature >100.4   Complete by: As directed    Diet general   Complete by: As directed    Discharge instructions   Complete by: As directed    Please review instructions on the discharge summary.  You were cared for by a hospitalist during your hospital stay. If you have any questions about your discharge medications or the care you received while you were in the hospital after you are discharged, you can call the unit and asked to speak with the hospitalist on call if the hospitalist that took care of you is not available. Once you are discharged, your primary care physician will handle any further medical issues. Please note that NO REFILLS for any discharge medications will be authorized once you are discharged, as it is imperative that you return to your primary care physician (or establish a relationship with a primary care physician if you do not have one) for your aftercare needs so that they can reassess your need for medications and monitor your lab values. If you do not have a primary care physician, you can call [phone] for a physician referral.   Increase activity slowly   Complete by: As directed         Allergies as of 02/18/2021      Reactions   Atorvastatin Nausea And Vomiting   Levofloxacin Other (See Comments)   Reaction: achilles tendon pain   Omnicef [cefdinir] Diarrhea   Uncontrollable diarrhea   Trazodone And Nefazodone Other (See Comments)   "Felt like I was going to faint"   Sulfa Antibiotics Rash   Sulfonamide Derivatives Rash      Medication List    STOP taking these medications   folic acid 1 MG tablet Commonly known as: FOLVITE   multivitamin with minerals Tabs tablet   potassium chloride  10 MEQ CR capsule Commonly known as: MICRO-K   vitamin B-12 100 MCG tablet Commonly known as: CYANOCOBALAMIN   Vitamin D 50 MCG (2000 UT) tablet     TAKE these medications   budesonide 3 MG 24 hr capsule Commonly known as: ENTOCORT EC TAKE 3 CAPSULES ONCE DAILY. What changed: See the new instructions.   buPROPion 150 MG 12 hr tablet Commonly known as: WELLBUTRIN SR Take 1 tablet (150 mg total) by mouth daily.   denosumab 60 MG/ML Sosy injection Commonly known as: PROLIA Inject 60 mg into the skin every 6 (six) months.   hyoscyamine 0.125 MG Tbdp disintergrating tablet Commonly known as: ANASPAZ Place 0.125 mg under the tongue every 6 (six) hours as needed ([name] cramps).   LOMOTIL PO Take 1 tablet by mouth 2 (two) times daily as needed (diarrhea).   methocarbamol 500 MG tablet Commonly known as: ROBAXIN Take 1 tablet (500 mg total) by mouth every 6 (six) hours as needed for muscle spasms.   methocarbamol 750 MG tablet Commonly known as: ROBAXIN Take 1 tablet (750 mg total) by mouth 3 (three) times daily.   morphine 15 MG 12 hr tablet Commonly known as: MS CONTIN Take 1 tablet (15 mg total) by mouth 3 (three) times daily.   nicotine 21 mg/24hr patch Commonly known as: NICODERM CQ - dosed in mg/24 hours Place 1 patch (21 mg total) onto the skin daily.   nitroGLYCERIN 0.4 MG SL tablet Commonly known as: NITROSTAT Place 1 tablet (0.4 mg total) under the tongue every 5 (five) minutes as needed for chest pain.   ondansetron  4 MG disintegrating tablet Commonly known as: ZOFRAN-ODT DISSOLVE 1 TABLET ON TONGUE EVERY 8 HOURS AS NEEDED FOR NAUSEA/VOMITING. What changed: See the new instructions.   Oxycodone HCl 10 MG Tabs Take 1 tablet (10 mg total) by mouth 4 (four) times daily as needed. What changed:   when to take this  reasons to take this   pantoprazole 40 MG tablet Commonly known as: PROTONIX TAKE (1) TABLET TWICE A DAY BEFORE MEALS. What changed:   how  much to take  how to take this  when to take this  reasons to take this  additional instructions   rosuvastatin 5 MG tablet Commonly known as: CRESTOR Take 1 tablet (5 mg total) by mouth 3 (three) times a week.   sertraline 100 MG tablet Commonly known as: ZOLOFT Take 2 tablets (200 mg total) by mouth at bedtime.   spironolactone 25 MG tablet Commonly known as: ALDACTONE Take 0.5 tablets (12.5 mg total) by mouth daily. What changed:   when to take this  reasons to take this   valACYclovir 500 MG tablet Commonly known as: VALTREX Take 500 mg by mouth 2 (two) times daily as needed (flare ups).         Follow-up Information    Biagio Borg, MD. Schedule an appointment as soon as possible for a visit in 1 week(s).   Specialties: Internal Medicine, Radiology Contact information: [place] [address] [phone]               TOTAL DISCHARGE TIME: 37 minutes  [place]  [facility] Pager on [email or web address]  02/18/2021, 11:00 AM

## 2021-02-19 DIAGNOSIS — G9341 Metabolic encephalopathy: Secondary | ICD-10-CM | POA: Diagnosis not present

## 2021-02-19 MED ORDER — ALPRAZOLAM 0.25 MG PO TABS
0.2500 mg | ORAL_TABLET | Freq: Once | ORAL | Status: AC | PRN
  Administered 2021-02-19: 0.25 mg via ORAL
  Filled 2021-02-19: qty 1

## 2021-02-19 MED ORDER — DICLOFENAC SODIUM 1 % EX GEL
2.0000 g | Freq: Four times a day (QID) | CUTANEOUS | Status: DC
  Administered 2021-02-19 – 2021-02-21 (×9): 2 g via TOPICAL
  Filled 2021-02-19: qty 100

## 2021-02-19 NOTE — Progress Notes (Signed)
Physical Therapy Treatment Patient Details Name: Denise King MRN: [id number] DOB: 15-Apr-1951 Today's Date: 02/19/2021    History of Present Illness Pt is 70 y.o. female with medical history significant for chronic hypoxic respiratory failure on 2 L continuous supplemental oxygen, COPD, chronic pain syndrome on chronic opioid therapy , chronic diastolic heart failure, depression, who is admitted to [facility] on 02/09/2021 with suspected acute metabolic encephalopathy due to sepsis from UTI. Patient reported fall on 2/2 (tripped on doorframe) landing on left hip and was seen in ED. Xray and CT hip negative for fracture. +hit her head but denied .  She did have R foot fracture but reports she was told she could ambulate.    PT Comments    Pt was seen for mobility on RW with assistance to get from the bed to the chair with cues for body mechanics.  Pt is waiting for pain meds, and was careful to sit supported on the chair for spine stabilization.  Follow along for goals of PT and continue to teach spinal body mechanics for protection of sacral ala fractures on her imaging report.      Follow Up Recommendations  SNF     Equipment Recommendations  None recommended by PT    Recommendations for Other Services       Precautions / Restrictions Precautions Precautions: Fall;Back Precaution Comments: falls, ck O2 sats Required Braces or Orthoses:  (spinal precautions) Restrictions Weight Bearing Restrictions: No    Mobility  Bed Mobility Overal bed mobility: Modified Independent Bed Mobility: Supine to Sit;Sit to Supine           General bed mobility comments: reviewed back precautions and sitting with supported posture    Transfers Overall transfer level: Needs assistance Equipment used: Rolling walker (2 wheeled) Transfers: Sit to/from Stand Sit to Stand: Min guard Stand pivot transfers: Min guard       General transfer comment: min guard for safety and  spinal support  Ambulation/Gait Ambulation/Gait assistance: Min guard Gait Distance (Feet): 120 Feet Assistive device: Rolling walker (2 wheeled) Gait Pattern/deviations: Step-to pattern;Step-through pattern;Decreased stride length Gait velocity: decreased Gait velocity interpretation: <1.31 ft/sec, indicative of household ambulator General Gait Details: RW for spinal support to unload L hip   Stairs             Wheelchair Mobility    Modified Rankin (Stroke Patients Only)       Balance Overall balance assessment: History of Falls;Needs assistance Sitting-balance support: Feet supported Sitting balance-Leahy Scale: Good     Standing balance support: Single extremity supported Standing balance-Leahy Scale: Fair Standing balance comment: using BUEs for toilet hygiene task with no LOB episodes                            Cognition Arousal/Alertness: Awake/alert Behavior During Therapy: WFL for tasks assessed/performed Overall Cognitive Status: Within Functional Limits for tasks assessed                                 General Comments: A&OX4. cognition appears WNLs.      Exercises General Exercises - Lower Extremity Ankle Circles/Pumps: AAROM;5 reps Long Arc Quad: Strengthening;10 reps Heel Slides: Strengthening;10 reps Hip ABduction/ADduction: Strengthening;10 reps    General Comments General comments (skin integrity, edema, etc.): sats were 95% at end of gait      Pertinent Vitals/Pain Pain Assessment: 0-10  Pain Score: 5  Pain Location: L hip and back Pain Descriptors / Indicators: Grimacing;Guarding Pain Intervention(s): Limited activity within patient's tolerance;Monitored during session;Repositioned;Patient requesting pain meds-RN notified (reminded nursing about meds)    Home Living Family/patient expects to be discharged to:: Private residence Living Arrangements: Spouse/significant other Available Help at Discharge:  Family Type of Home: House Home Access: Stairs to enter Entrance Stairs-Rails: Left Home Layout: One level Home Equipment: Environmental consultant - 2 wheels;Walker - 4 wheels;Cane - single point;Shower seat - built in;Grab bars - tub/shower;Hand held shower head      Prior Function Level of Independence: Independent with assistive device(s)      Comments: began using a rollator after her recent fall; 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 Pt drives, grocery shops. Enjoys cooking but limited at times due to chronic back pain   PT Goals (current goals can now be found in the care plan section) Acute Rehab PT Goals Patient Stated Goal: to return home to husband Progress towards PT goals: Progressing toward goals    Frequency    Min 3X/week      PT Plan Current plan remains appropriate    Co-evaluation              AM-PAC PT "6 Clicks" Mobility   Outcome Measure  Help needed turning from your back to your side while in a flat bed without using bedrails?: A Little Help needed moving from lying on your back to sitting on the side of a flat bed without using bedrails?: A Little Help needed moving to and from a bed to a chair (including a wheelchair)?: A Little Help needed standing up from a chair using your arms (e.g., wheelchair or bedside chair)?: A Little Help needed to walk in hospital room?: A Little Help needed climbing 3-5 steps with a railing? : A Little 6 Click Score: 18    End of Session Equipment Utilized During Treatment: Gait belt Activity Tolerance: Patient limited by pain;Treatment limited secondary to medical complications (Comment) Patient left: in bed;with call bell/phone within reach Nurse Communication: Mobility status PT Visit Diagnosis: Difficulty in walking, not elsewhere classified (R26.2);Pain Pain - Right/Left: Left Pain - part of body: Hip     Time: 8101-7510 PT Time Calculation (min) (ACUTE ONLY): 30  min  Charges:  $Gait Training: 8-22 mins $Therapeutic Exercise: 8-22 mins                   Ramond Dial 02/19/2021, 5:39 PM Mee Hives, PT MS Acute Rehab Dept. Number: [place]

## 2021-02-19 NOTE — TOC Progression Note (Signed)
Transition of Care [facility]) - Progression Note    Patient Details  Name: [name] MRN: [id number] Date of Birth: 1951/01/24  Transition of Care [facility]) CM/SW [place], RN Phone Number: [phone]  02/19/2021, 4:13 PM  [facility] Narrative:    Patient to be discharged home with [facility] services. HH has been arranged through [facility]. DME 3in 1 to be delivered per [employer]. Patient states that she does not need any other DME and no other recommendations noted. Message has been sent to MD to verify that patient is clear for discharge home today. Bedside nurse has been made aware and added to the chat. No other needs noted at this time. TOC will sign off.    Expected Discharge Plan: Skilled Nursing Facility Barriers to Discharge: No Barriers Identified  Expected Discharge Plan and Services Expected Discharge Plan: [place] In-house Referral: NA Discharge Planning Services: CM Consult Post Acute Care Choice: [place] Living arrangements for the past 2 months: Single Family Home Expected Discharge Date: 02/17/21               DME Arranged: 3-N-1 DME Agency: [employer] Date DME Agency Contacted: 02/19/21 Time DME Agency Contacted: [phone] Representative spoke with at DME Agency: [place]: PT,OT,RN,Nurse's Aide [place]: [facility]) Date [place]: 02/19/21 Time [place]: [phone] Representative spoke with at [place]: [place]   Social Determinants of Health ([place]) Interventions    Readmission Risk Interventions Readmission Risk Prevention Plan 07/10/2020  Transportation Screening Complete  PCP or Specialist Appt within 3-5 Days Complete  HRI or [place] Complete  Social Work Consult for [place] Planning/Counseling Complete  Palliative Care Screening Not Applicable  Some recent data might be hidden

## 2021-02-19 NOTE — Care Management Important Message (Signed)
Important Message  Patient Details  Name: Denise King MRN: [id number] Date of Birth: 03/25/1951   Medicare Important Message Given:  Yes     Delorse Lek 02/19/2021, 2:41 PM

## 2021-02-19 NOTE — Progress Notes (Addendum)
Occupational Therapy Treatment Patient Details Name: Denise King MRN: [id number] DOB: [birth year] Today's Date: 02/19/2021    History of present illness Pt is 70 y.o. female with medical history significant for chronic hypoxic respiratory failure on 2 L continuous supplemental oxygen, COPD, chronic pain syndrome on chronic opioid therapy , chronic diastolic heart failure, depression, who is admitted to [facility] on 02/09/2021 with suspected acute metabolic encephalopathy due to sepsis from UTI. Patient reported fall on 2/2 (tripped on doorframe) landing on left hip and was seen in ED. Xray and CT hip negative for fracture. +hit her head but denied .  She did have R foot fracture but reports she was told she could ambulate.   OT comments  Pt progressing well today with OOB ADL and mobility. Pt nearly at baseline, but continues to struggle with chronic pain in low back and LLE, but they do not disrupt ability to perform own ADL and mobility.  Pt requires increased time. Pt able to perform ADL functional tasks at sink in standing and at commode in standing with no physical assist for tasks. Pt ambulating 50' with RW and no rest breaks. Pt reports seeing small black spots in vision and pt reports that they go away soon after. Pt WBing through LLE for mobility and continues to have pain across low back that appears chronic. O2 >90% on RA; HR 77 BPM with exertion. Pt would benefit from continued OT skilled services. OT following acutely. ** would help if daughter is available to assist daily for iADL/cooking needs for a few hours daily. Denise King reports that she lives nearby and can assist.   Follow Up Recommendations  Home health OT;Supervision - Intermittent    Equipment Recommendations  3 in 1 bedside commode    Recommendations for Other Services      Precautions / Restrictions Precautions Precautions: Fall Precaution Comments: 3 falls in 3 years, monitor O2 Restrictions Weight  Bearing Restrictions: No       Mobility Bed Mobility Overal bed mobility: Modified Independent             General bed mobility comments: increased time, no use of rail    Transfers Overall transfer level: Needs assistance Equipment used: Rolling walker (2 wheeled) Transfers: Sit to/from [facility] Sit to Stand: Supervision Stand pivot transfers: Supervision       General transfer comment: RW for stability    Balance Overall balance assessment: History of Falls;Needs assistance Sitting-balance support: No upper extremity supported Sitting balance-Leahy Scale: Good     Standing balance support: No upper extremity supported;During functional activity Standing balance-Leahy Scale: Fair Standing balance comment: using BUEs for toilet hygiene task with no LOB episodes                           ADL either performed or assessed with clinical judgement   ADL Overall ADL's : Needs assistance/impaired     Grooming: Supervision/safety;Standing           Upper Body Dressing : Set up;Sitting   Lower Body Dressing: Supervision/safety;Sitting/lateral leans;Sit to/from stand Lower Body Dressing Details (indicate cue type and reason): EOB LB dressing Toilet Transfer: Supervision/safety;RW;Ambulation   Toileting- Clothing Manipulation and Hygiene: Supervision/safety;Sit to/from stand Toileting - Clothing Manipulation Details (indicate cue type and reason): no physical assist required     Functional mobility during ADLs: Supervision/safety;Rolling walker General ADL Comments: Pt able to perform ADL functional tasks at sink in standing and  at commode in standing. Pt does not require physical assist for tasks. Pt ambulating 50' with RW and no rest breaks. Pt reports seeing small black spots in vision and pt reports that they go away soon after. Pt WBing through LLE for mobility and continues to have pain across low back that appears chronic.      Vision Baseline Vision/History: Wears glasses Wears Glasses: At all times Patient Visual Report: No change from baseline Additional Comments: Pt reports seeing spots, but they go away after a few sedonds. pt asked not to hold breath as pt gripping RW due to low back pain.   Perception     Praxis      Cognition Arousal/Alertness: Awake/alert Behavior During Therapy: WFL for tasks assessed/performed Overall Cognitive Status: Within Functional Limits for tasks assessed                                 General Comments: A&OX4. cognition appears WNLs.        Exercises     Shoulder Instructions       General Comments O2 >90% on RA; HR 77 BPM with exertion.    Pertinent Vitals/ Pain       Pain Assessment: 0-10 Pain Score: 5  Pain Location: left hip, back, rt toes Pain Descriptors / Indicators: Aching;Grimacing;Guarding;Cramping Pain Intervention(s): Monitored during session;Limited activity within patient's tolerance  Home Living Family/patient expects to be discharged to:: Private residence Living Arrangements: Spouse/significant other Available Help at Discharge: Family Type of Home: House Home Access: Stairs to enter Technical brewer of Steps: 4+1 Entrance Stairs-Rails: Left Home Layout: One level     Bathroom Shower/Tub: Occupational psychologist: Standard Bathroom Accessibility: Yes   Home Equipment: Environmental consultant - 2 wheels;Walker - 4 wheels;Cane - single point;Shower seat - built in;Grab bars - tub/shower;Hand held shower head          Prior Functioning/Environment Level of Independence: Independent with assistive device(s)        Comments: began using a rollator after her recent fall; 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 Pt drives, grocery shops. Enjoys cooking but limited at times due to chronic back pain   Frequency  Min 2X/week        Progress Toward Goals  OT  Goals(current goals can now be found in the care plan section)     Acute Rehab OT Goals Patient Stated Goal: to return home to husband OT Goal Formulation: With patient Time For Goal Achievement: 02/26/21 Potential to Achieve Goals: Good  Plan      Co-evaluation                 AM-PAC OT "6 Clicks" Daily Activity     Outcome Measure   Help from another person eating meals?: None Help from another person taking care of personal grooming?: None Help from another person toileting, which includes using toliet, bedpan, or urinal?: A Little Help from another person bathing (including washing, rinsing, drying)?: A Little Help from another person to put on and taking off regular upper body clothing?: None Help from another person to put on and taking off regular lower body clothing?: None 6 Click Score: 22    End of Session Equipment Utilized During Treatment: Gait belt;Rolling walker  OT Visit Diagnosis: Muscle weakness (generalized) (M62.81);Pain Pain - Right/Left: Left Pain - part of body: Leg (and back)   Activity  Tolerance Patient tolerated treatment well   Patient Left in chair;with call bell/phone within reach;with chair alarm set;with family/visitor present   Nurse Communication Mobility status        Time: 5009-3818 OT Time Calculation (min): 17 min  Charges: OT General Charges $OT Visit: 1 Visit OT Treatments $Self Care/Home Management : 8-22 mins  Jefferey Pica, OTR/L Acute Rehabilitation Services Pager: [phone] Office: [phone]    Abeeha Twist C 02/19/2021, 2:35 PM

## 2021-02-19 NOTE — Plan of Care (Signed)
  Problem: Education: Goal: Knowledge of General Education information will improve Description: Including pain rating scale, medication(s)/side effects and non-pharmacologic comfort measures Outcome: Not Progressing   Problem: Health Behavior/Discharge Planning: Goal: Ability to manage health-related needs will improve Outcome: Not Progressing   Problem: Clinical Measurements: Goal: Will remain free from infection Outcome: Not Progressing Goal: Diagnostic test results will improve Outcome: Not Progressing   Problem: Activity: Goal: Risk for activity intolerance will decrease Outcome: Not Progressing   Problem: Coping: Goal: Level of anxiety will decrease Outcome: Not Progressing   Problem: Elimination: Goal: Will not experience complications related to bowel motility Outcome: Not Progressing Goal: Will not experience complications related to urinary retention Outcome: Not Progressing   Problem: Pain Managment: Goal: General experience of comfort will improve Outcome: Not Progressing   Problem: Safety: Goal: Ability to remain free from injury will improve Outcome: Not Progressing   Problem: Skin Integrity: Goal: Risk for impaired skin integrity will decrease Outcome: Not Progressing

## 2021-02-19 NOTE — Progress Notes (Signed)
PROGRESS NOTE    Denise King  VFI:[id number] DOB: Oct 02, 1951 DOA: 02/09/2021 PCP: Biagio Borg, MD   Chief Complaint  Patient presents with  . Fever    Brief Narrative:  70 y.o.femalewith medical history significant forchronic hypoxic respiratory failure on 2 L continuous supplemental oxygen, COPD, chronic pain syndrome on chronic opioid therapy,chronic diastolic heart failure, depression,who was admitted to [facility] on2/13/2022 with altered mental status. Thought to be secondary to UTI.  Discharge at this time pending SNF placement.   Assessment & Plan:   Principal Problem:   Acute metabolic encephalopathy Active Problems:   GERD   Depression   Tobacco abuse   Severe sepsis (HCC)   Acute cystitis   Left hip pain  Acute metabolic encephalopathy This has resolved. Was most likely secondary to urinary tract infection. CT scan of the head did not show any acute findings.  TSH noted to be 0.175. Free T4 is noted to be normal at 0.79. Likely sick euthyroid. Needs repeat check outpatient.  Delirium precautions  E. coli urinary tract infection with sepsis present on admission/staph hominis bacteremia Initially placed on ceftriaxone. Based on sensitivities she was changed over to Keflex.   She has completed 7-day treatment. Staph hominis noted on blood cultures (in 1/2 sets). Likely contaminant.  Follow clinically.  Left hip  posterior thigh pain Fracture of sacral ala No clear etiology for same. No obvious lesions noted on examination. CT hip did not show any fractures. The pain is mostly in the muscle area posteriorly. CTdid cover the area of concern and did not show any abnormalities. Straight leg raising test was negative.   Patient was started on Robaxin.   MRI L femur shows acute to subacute bilateral sacral alar fx, mild intramuscular edema within L adductor compartment (mild strain) Discussed with ortho informally, ok with WBAT,  no need for follow up Continue therapy  Chronic hypoxic respiratory failure/history of COPD Chronically on 2 L of oxygen. Respiratory status seems to be stable at this time.  Acute kidney injury Resolved  History of GERD PPI  History of depression Continue with Wellbutrin and Zoloft.  Cymbalta discontinued.  Normocytic anemia Hemoglobin is stable. No evidence of overt bleeding.  Chronic diastolic CHF Most recent echocardiogram done last July showed normal systolic function. Appears to be reasonably compensated at this time.   Chronic tobacco abuse She was counseled.  Chronic pain syndrome Patient noted to be on MS Contin which is being continued. PDMP database was reviewed. Patient also on oxycodone 10 mg. Was also on buprenorphine patch but apparently not taking it anymore.  History of lymphocytic colitis On budesonide. Follows with Dr. Carlean Purl with gastroenterology.  DVT prophylaxis: SCD Code Status: full  Family Communication: none at bedside - daughter over phone Disposition:   Status is: Inpatient  Remains inpatient appropriate because:Inpatient level of care appropriate due to severity of illness   Dispo: The patient is from: Home              Anticipated d/c is to: pending              Anticipated d/c date is: > 3 days              Patient currently is not medically stable to d/c.   Difficult to place patient Yes       Consultants:   none  Procedures:  none  Antimicrobials:  Anti-infectives (From admission, onward)   Start     Dose/Rate  Route Frequency Ordered Stop   02/12/21 1400  cephALEXin (KEFLEX) capsule 500 mg        500 mg Oral Every 8 hours 02/12/21 0908 02/15/21 2016   02/10/21 2000  cefTRIAXone (ROCEPHIN) 1 g in sodium chloride 0.9 % 100 mL IVPB  Status:  Discontinued        1 g 200 mL/hr over 30 Minutes Intravenous Every 24 hours 02/09/21 2324 02/12/21 0908   02/09/21 1830  cefTRIAXone (ROCEPHIN) 1 g in sodium  chloride 0.9 % 100 mL IVPB        1 g 200 mL/hr over 30 Minutes Intravenous  Once 02/09/21 1817 02/09/21 2017     Subjective: C/o L posterior hip pain No other complaints  Objective: Vitals:   02/18/21 0905 02/18/21 1959 02/19/21 0517 02/19/21 1507  BP: 140/81 122/75 122/90 106/67  Pulse: 74 77 89 85  Resp: 19 20 20 20   Temp: 98.3 F (36.8 C) 97.8 F (36.6 C) 98.2 F (36.8 C) 98.4 F (36.9 C)  TempSrc:  Oral Oral   SpO2: 94% 94% 94% 93%  Weight:      Height:       No intake or output data in the 24 hours ending 02/19/21 1745 Filed Weights   02/13/21 0500 02/16/21 0500  Weight: 56.2 kg 56 kg    Examination:  General exam: Appears calm and comfortable  Respiratory system: unlabored Cardiovascular system: RRR Gastrointestinal system: Abdomen is nondistended, soft and nontender Central nervous system: Alert and oriented. No focal neurological deficits. Extremities: mild TTP to L buttock  Skin: No rashes, lesions or ulcers Psychiatry: Judgement and insight appear normal. Mood & affect appropriate.     Data Reviewed: I have personally reviewed following labs and imaging studies  CBC: Recent Labs  Lab 02/14/21 0429 02/17/21 0330  WBC 8.7 9.4  HGB 11.4* 10.9*  HCT 36.4 32.7*  MCV 96.3 94.0  PLT 366 149    Basic Metabolic Panel: Recent Labs  Lab 02/14/21 0429 02/17/21 0330  NA 140 136  K 4.9 3.7  CL 96* 95*  CO2 33* 31  GLUCOSE 99 101*  BUN 7* 10  CREATININE 0.78 0.85  CALCIUM 9.1 8.8*    GFR: Estimated Creatinine Clearance: 53.1 mL/min (by C-G formula based on SCr of 0.85 mg/dL).  Liver Function Tests: Recent Labs  Lab 02/17/21 0330  AST 15  ALT 8  ALKPHOS 74  BILITOT 0.3  PROT 6.1*  ALBUMIN 3.0*    CBG: No results for input(s): GLUCAP in the last 168 hours.   Recent Results (from the past 240 hour(s))  Blood Culture (routine x 2)     Status: None   Collection Time: 02/09/21  7:35 PM   Specimen: BLOOD  Result Value Ref Range  Status   Specimen Description BLOOD SITE NOT SPECIFIED  Final   Special Requests   Final    BOTTLES DRAWN AEROBIC ONLY Blood Culture adequate volume   Culture   Final    NO GROWTH 5 DAYS Performed at [facility], [address] [place], [place] [address]    Report Status 02/14/2021 FINAL  Final  Urine culture     Status: Abnormal   Collection Time: 02/09/21  9:24 PM   Specimen: In/Out Cath Urine  Result Value Ref Range Status   Specimen Description IN/OUT CATH URINE  Final   Special Requests   Final    NONE Performed at [facility], [place],  [place] [address]    Culture >=100,000 COLONIES/mL ESCHERICHIA COLI (A)  Final   Report Status 02/12/2021 FINAL  Final   Organism ID, Bacteria ESCHERICHIA COLI (A)  Final      Susceptibility   Escherichia coli - MIC*    AMPICILLIN 8 SENSITIVE Sensitive     CEFAZOLIN <=4 SENSITIVE Sensitive     CEFEPIME <=0.12 SENSITIVE Sensitive     CEFTRIAXONE <=0.25 SENSITIVE Sensitive     CIPROFLOXACIN <=0.25 SENSITIVE Sensitive     GENTAMICIN <=1 SENSITIVE Sensitive     IMIPENEM <=0.25 SENSITIVE Sensitive     NITROFURANTOIN 32 SENSITIVE Sensitive     TRIMETH/SULFA <=20 SENSITIVE Sensitive     AMPICILLIN/SULBACTAM 4 SENSITIVE Sensitive     PIP/TAZO <=4 SENSITIVE Sensitive     * >=100,000 COLONIES/mL ESCHERICHIA COLI         Radiology Studies: No results found.      Scheduled Meds: . budesonide  9 mg Oral Daily  . buPROPion  150 mg Oral Daily  . diclofenac Sodium  2 g Topical QID  . methocarbamol  750 mg Oral TID  . morphine  15 mg Oral TID  . pantoprazole  40 mg Oral BID  . sertraline  200 mg Oral QHS  . vitamin B-12  1,000 mcg Oral Daily   Continuous Infusions:   LOS: 10 days    Time spent: over 30 min    Fayrene Helper, MD [facility]   To contact the attending provider between 7A-7P or the covering provider during after hours 7P-7A, please log into the web site [email or web address] and  access using universal [place] password for that web site. If you do not have the password, please call the hospital operator.  02/19/2021, 5:45 PM

## 2021-02-20 LAB — COMPREHENSIVE METABOLIC PANEL
ALT: 8 U/L (ref 0–44)
AST: 14 U/L — ABNORMAL LOW (ref 15–41)
Albumin: 3.2 g/dL — ABNORMAL LOW (ref 3.5–5.0)
Alkaline Phosphatase: 85 U/L (ref 38–126)
Anion gap: 9 (ref 5–15)
BUN: 12 mg/dL (ref 8–23)
CO2: 28 mmol/L (ref 22–32)
Calcium: 8.8 mg/dL — ABNORMAL LOW (ref 8.9–10.3)
Chloride: 98 mmol/L (ref 98–111)
Creatinine, Ser: 0.86 mg/dL (ref 0.44–1.00)
GFR, Estimated: >60 mL/min (ref >60)
Glucose, Bld: 100 mg/dL — ABNORMAL HIGH (ref 70–99)
Potassium: 3.8 mmol/L (ref 3.5–5.1)
Sodium: 135 mmol/L (ref 135–145)
Total Bilirubin: 0.3 mg/dL (ref 0.3–1.2)
Total Protein: 6.1 g/dL — ABNORMAL LOW (ref 6.5–8.1)

## 2021-02-20 LAB — CBC WITH DIFFERENTIAL/PLATELET
Abs Immature Granulocytes: 0.03 10*3/uL (ref 0.00–0.07)
Basophils Absolute: 0.1 10*3/uL (ref 0.0–0.1)
Basophils Relative: 1 %
Eosinophils Absolute: 0.2 10*3/uL (ref 0.0–0.5)
Eosinophils Relative: 2 %
HCT: 33.5 % — ABNORMAL LOW (ref 36.0–46.0)
Hemoglobin: 10.8 g/dL — ABNORMAL LOW (ref 12.0–15.0)
Immature Granulocytes: 0 %
Lymphocytes Relative: 39 %
Lymphs Abs: 4.3 10*3/uL — ABNORMAL HIGH (ref 0.7–4.0)
MCH: 30.5 pg (ref 26.0–34.0)
MCHC: 32.2 g/dL (ref 30.0–36.0)
MCV: 94.6 fL (ref 80.0–100.0)
Monocytes Absolute: 1.1 10*3/uL — ABNORMAL HIGH (ref 0.1–1.0)
Monocytes Relative: 10 %
Neutro Abs: 5.4 10*3/uL (ref 1.7–7.7)
Neutrophils Relative %: 48 %
Platelets: 331 10*3/uL (ref 150–400)
RBC: 3.54 MIL/uL — ABNORMAL LOW (ref 3.87–5.11)
RDW: 11.7 % (ref 11.5–15.5)
WBC: 11 10*3/uL — ABNORMAL HIGH (ref 4.0–10.5)
nRBC: 0 % (ref 0.0–0.2)

## 2021-02-20 LAB — MAGNESIUM: Magnesium: 2.3 mg/dL (ref 1.7–2.4)

## 2021-02-20 LAB — SARS CORONAVIRUS 2 (TAT 6-24 HRS): SARS Coronavirus 2: NEGATIVE

## 2021-02-20 LAB — PHOSPHORUS: Phosphorus: 4.8 mg/dL — ABNORMAL HIGH (ref 2.5–4.6)

## 2021-02-20 MED ORDER — ONDANSETRON HCL 4 MG PO TABS
4.0000 mg | ORAL_TABLET | Freq: Three times a day (TID) | ORAL | Status: DC | PRN
  Administered 2021-02-20: 4 mg via ORAL
  Filled 2021-02-20: qty 1

## 2021-02-20 MED ORDER — MORPHINE SULFATE ER 15 MG PO TBCR
15.0000 mg | EXTENDED_RELEASE_TABLET | Freq: Once | ORAL | Status: AC | PRN
  Administered 2021-02-20: 15 mg via ORAL
  Filled 2021-02-20: qty 1

## 2021-02-20 NOTE — Progress Notes (Signed)
Physical Therapy Treatment Patient Details Name: Denise King MRN: [id number] DOB: 05-10-1951 Today's Date: 02/20/2021    History of Present Illness Pt is 70 y.o. female with medical history significant for chronic hypoxic respiratory failure on 2 L continuous supplemental oxygen, COPD, chronic pain syndrome on chronic opioid therapy , chronic diastolic heart failure, depression, who is admitted to [facility] on 02/09/2021 with suspected acute metabolic encephalopathy due to sepsis from UTI. Patient reported fall on 2/2 (tripped on doorframe) landing on left hip and was seen in ED. Xray and CT hip negative for fracture, MRI L femur shows bilat sacral ala fractures. +hit her head but denied .  She did have R foot fracture but reports she was told she could ambulate.    PT Comments    Pt reporting nausea/vomiting this morning with concerns for vomiting up pain pills, RN aware. Pt reporting moderate back pain, improved with mobility. Pt ambulatory in hallway with use of RW, requiring cues for form and safety, as well as close guard for safety. Pt remains a high fall risk, and per pt report pt's husband has significant mobility issues as well. Continuing to recommend SNF level of care post-acutely to maximize functional mobility and decrease fall risk.    Follow Up Recommendations  SNF     Equipment Recommendations  None recommended by PT    Recommendations for Other Services       Precautions / Restrictions Precautions Precautions: Fall Required Braces or Orthoses:  (spinal precautions) Restrictions Weight Bearing Restrictions: No    Mobility  Bed Mobility Overal bed mobility: Modified Independent             General bed mobility comments: sitting EOB upon arrival to room    Transfers Overall transfer level: Needs assistance Equipment used: Rolling walker (2 wheeled) Transfers: Sit to/from Stand Sit to Stand: Min guard         General transfer comment:  for safety, verbal cuing for sequencing from stand>sit  Ambulation/Gait Ambulation/Gait assistance: Min guard Gait Distance (Feet): 100 Feet Assistive device: Rolling walker (2 wheeled) Gait Pattern/deviations: Step-through pattern;Decreased stride length;Trunk flexed Gait velocity: decr   General Gait Details: Min guard for safety, verbal cuing for upright posture. DOE 2/4, SPO2 94% when assessed   Stairs             Wheelchair Mobility    Modified Rankin (Stroke Patients Only)       Balance Overall balance assessment: History of Falls;Needs assistance Sitting-balance support: Feet supported Sitting balance-Leahy Scale: Good     Standing balance support: Single extremity supported Standing balance-Leahy Scale: Fair Standing balance comment: able to stand statically without UE support, requires RW for dynamic activity                            Cognition Arousal/Alertness: Awake/alert Behavior During Therapy: WFL for tasks assessed/performed Overall Cognitive Status: Within Functional Limits for tasks assessed                                        Exercises Other Exercises Other Exercises: gentle cat/cow in sitting, emphasis on cat posture due to pt preference for spinal flexion    General Comments        Pertinent Vitals/Pain Pain Assessment: Faces Faces Pain Scale: Hurts even more Pain Location: back Pain Descriptors / Indicators:  Grimacing;Guarding;Aching Pain Intervention(s): Limited activity within patient's tolerance;Monitored during session;Repositioned    Home Living                      Prior Function            PT Goals (current goals can now be found in the care plan section) Acute Rehab PT Goals Patient Stated Goal: to return home to husband PT Goal Formulation: With patient Time For Goal Achievement: 02/26/21 Potential to Achieve Goals: Good Progress towards PT goals: Progressing toward goals     Frequency    Min 3X/week      PT Plan Current plan remains appropriate    Co-evaluation              AM-PAC PT "6 Clicks" Mobility   Outcome Measure  Help needed turning from your back to your side while in a flat bed without using bedrails?: A Little Help needed moving from lying on your back to sitting on the side of a flat bed without using bedrails?: A Little Help needed moving to and from a bed to a chair (including a wheelchair)?: A Little Help needed standing up from a chair using your arms (e.g., wheelchair or bedside chair)?: A Little Help needed to walk in hospital room?: A Little Help needed climbing 3-5 steps with a railing? : A Little 6 Click Score: 18    End of Session   Activity Tolerance: Patient limited by pain;Patient tolerated treatment well Patient left: with call bell/phone within reach;in chair;with chair alarm set Nurse Communication: Mobility status PT Visit Diagnosis: Difficulty in walking, not elsewhere classified (R26.2);Pain Pain - Right/Left:  (low) Pain - part of body:  (back)     Time: 3007-6226 PT Time Calculation (min) (ACUTE ONLY): 20 min  Charges:  $Gait Training: 8-22 mins                     Stacie Glaze, PT Acute Rehabilitation Services Pager [phone]  Office [phone]    [place] 02/20/2021, 10:25 AM

## 2021-02-20 NOTE — Progress Notes (Addendum)
PROGRESS NOTE    Denise King  HKV:[id number] DOB: 11-10-1951 DOA: 02/09/2021 PCP: Biagio Borg, MD   Chief Complaint  Patient presents with   Fever    Brief Narrative:  70 y.o.femalewith medical history significant forchronic hypoxic respiratory failure on 2 L continuous supplemental oxygen, COPD, chronic pain syndrome on chronic opioid therapy,chronic diastolic heart failure, depression,who was admitted to [facility] on2/13/2022 with altered mental status. Thought to be secondary to UTI.  Discharge at this time pending SNF placement. Tomorrow, 2/25, hopefully.  Assessment & Plan:   Principal Problem:   Acute metabolic encephalopathy Active Problems:   GERD   Depression   Tobacco abuse   Severe sepsis (HCC)   Acute cystitis   Left hip pain  Acute metabolic encephalopathy This has resolved. Was most likely secondary to urinary tract infection. CT scan of the head did not show any acute findings.  TSH noted to be 0.175. Free T4 is noted to be normal at 0.79. Likely sick euthyroid. Needs repeat check outpatient.  Delirium precautions  E. coli urinary tract infection with sepsis present on admission/staph hominis bacteremia Initially placed on ceftriaxone. Based on sensitivities she was changed over to Keflex.   She has completed 7-day treatment. Staph hominis noted on blood cultures (in 1/2 sets). Likely contaminant.  Follow clinically.  Left hip   posterior thigh pain  Fracture of sacral ala No clear etiology for same. No obvious lesions noted on examination. CT hip did not show any fractures. The pain is mostly in the muscle area posteriorly. CTdid cover the area of concern and did not show any abnormalities. Straight leg raising test was negative.   Patient was started on Robaxin.   MRI L femur shows acute to subacute bilateral sacral alar fx, mild intramuscular edema within L adductor compartment (mild strain) Discussed with ortho  informally, ok with WBAT, no need for follow up Continue therapy Improved pain with voltaren  Chronic hypoxic respiratory failure/history of COPD Chronically on 2 L of oxygen. Respiratory status seems to be stable at this time.  Acute kidney injury Resolved  History of GERD PPI  History of depression Continue with Wellbutrin and Zoloft.  Cymbalta discontinued.  Normocytic anemia Hemoglobin is stable. No evidence of overt bleeding.  Chronic diastolic CHF Most recent echocardiogram done last July showed normal systolic function. Appears to be reasonably compensated at this time.   Chronic tobacco abuse She was counseled.  Chronic pain syndrome Patient noted to be on MS Contin which is being continued. PDMP database was reviewed. Patient also on oxycodone 10 mg. Was also on buprenorphine patch but apparently not taking it anymore.  History of lymphocytic colitis On budesonide. Follows with Dr. Carlean Purl with gastroenterology.  DVT prophylaxis: SCD Code Status: full  Family Communication: none at bedside - daughter over phone 2/24 Disposition:   Status is: Inpatient  Remains inpatient appropriate because:Inpatient level of care appropriate due to severity of illness   Dispo: The patient is from: Home              Anticipated d/c is to: pending              Anticipated d/c date is: > 3 days              Patient currently is not medically stable to d/c.   Difficult to place patient Yes       Consultants:   none  Procedures:  none  Antimicrobials:  Anti-infectives (From admission,  onward)   Start     Dose/Rate Route Frequency Ordered Stop   02/12/21 1400  cephALEXin (KEFLEX) capsule 500 mg        500 mg Oral Every 8 hours 02/12/21 0908 02/15/21 2016   02/10/21 2000  cefTRIAXone (ROCEPHIN) 1 g in sodium chloride 0.9 % 100 mL IVPB  Status:  Discontinued        1 g 200 mL/hr over 30 Minutes Intravenous Every 24 hours 02/09/21 2324 02/12/21 0908    02/09/21 1830  cefTRIAXone (ROCEPHIN) 1 g in sodium chloride 0.9 % 100 mL IVPB        1 g 200 mL/hr over 30 Minutes Intravenous  Once 02/09/21 1817 02/09/21 2017     Subjective: Pain improved  Objective: Vitals:   02/19/21 1507 02/19/21 2050 02/20/21 0637 02/20/21 1200  BP: 106/67 135/64 132/86 125/69  Pulse: 85 79 79 76  Resp: 20 18 18 20   Temp: 98.4 F (36.9 C) 98.4 F (36.9 C) 98.5 F (36.9 C) 98.1 F (36.7 C)  TempSrc:  Oral Oral   SpO2: 93% 96% 94% 92%  Weight:      Height:       No intake or output data in the 24 hours ending 02/20/21 1749 Filed Weights   02/13/21 0500 02/16/21 0500  Weight: 56.2 kg 56 kg    Examination:  General: No acute distress. Up with walker, going to bathroom Lungs: unlabored Neurological: Alert and oriented 3. Moves all extremities 4 . Cranial nerves II through XII grossly intact. Skin: Warm and dry. No rashes or lesions. Extremities: No clubbing or cyanosis. No edema.   Data Reviewed: I have personally reviewed following labs and imaging studies  CBC: Recent Labs  Lab 02/14/21 0429 02/17/21 0330 02/20/21 0159  WBC 8.7 9.4 11.0*  NEUTROABS  --   --  5.4  HGB 11.4* 10.9* 10.8*  HCT 36.4 32.7* 33.5*  MCV 96.3 94.0 94.6  PLT 366 352 716    Basic Metabolic Panel: Recent Labs  Lab 02/14/21 0429 02/17/21 0330 02/20/21 0159  NA 140 136 135  K 4.9 3.7 3.8  CL 96* 95* 98  CO2 33* 31 28  GLUCOSE 99 101* 100*  BUN 7* 10 12  CREATININE 0.78 0.85 0.86  CALCIUM 9.1 8.8* 8.8*  MG  --   --  2.3  PHOS  --   --  4.8*    GFR: Estimated Creatinine Clearance: 52.5 mL/min (by C-G formula based on SCr of 0.86 mg/dL).  Liver Function Tests: Recent Labs  Lab 02/17/21 0330 02/20/21 0159  AST 15 14*  ALT 8 8  ALKPHOS 74 85  BILITOT 0.3 0.3  PROT 6.1* 6.1*  ALBUMIN 3.0* 3.2*    CBG: No results for input(s): GLUCAP in the last 168 hours.   Recent Results (from the past 240 hour(s))  SARS CORONAVIRUS 2 (TAT 6-24 HRS)  Nasopharyngeal Nasopharyngeal Swab     Status: None   Collection Time: 02/20/21  9:56 AM   Specimen: Nasopharyngeal Swab  Result Value Ref Range Status   SARS Coronavirus 2 NEGATIVE NEGATIVE Final    Comment: (NOTE) SARS-CoV-2 target nucleic acids are NOT DETECTED.  The SARS-CoV-2 RNA is generally detectable in upper and lower respiratory specimens during the acute phase of infection. Negative results do not preclude SARS-CoV-2 infection, do not rule out co-infections with other pathogens, and should not be used as the sole basis for treatment or other patient management decisions. Negative results must be combined  with clinical observations, patient history, and epidemiological information. The expected result is Negative.  Fact Sheet for Patients: SugarRoll.be  Fact Sheet for Healthcare Providers: https://www.woods-mathews.com/  This test is not yet approved or cleared by the [place] FDA and  has been authorized for detection and/or diagnosis of SARS-CoV-2 by FDA under an Emergency Use Authorization (EUA). This EUA will remain  in effect (meaning this test can be used) for the duration of the COVID-19 declaration under Se ction 564(b)(1) of the Act, 21 U.S.C. section 360bbb-3(b)(1), unless the authorization is terminated or revoked sooner.  Performed at [facility], [place] [address], [place], [place] [address]          Radiology Studies: No results found.      Scheduled Meds:  budesonide  9 mg Oral Daily   buPROPion  150 mg Oral Daily   diclofenac Sodium  2 g Topical QID   methocarbamol  750 mg Oral TID   morphine  15 mg Oral TID   pantoprazole  40 mg Oral BID   sertraline  200 mg Oral QHS   vitamin B-12  1,000 mcg Oral Daily   Continuous Infusions:   LOS: 11 days    Time spent: over 30 min    Fayrene Helper, MD [facility]   To contact the attending provider between 7A-7P or the  covering provider during after hours 7P-7A, please log into the web site [email or web address] and access using universal [place] password for that web site. If you do not have the password, please call the hospital operator.  02/20/2021, 5:49 PM

## 2021-02-20 NOTE — TOC Progression Note (Addendum)
Transition of Care [facility]) - Progression Note    Patient Details  Name: Denise King MRN: [id number] Date of Birth: [birth year]  [facility] CM/SW Contact  Denise Ingles, RN Phone Number: [phone]  02/20/2021, 9:43 AM  Clinical Narrative:    Received message form MD in reference to daughters concern for mothers safety at home. CM at bedside to speak to the patient. Patient made aware that CM has received bed offer from [facility] this am. Patient gives verbal consent for CM to speak with her daughter. CM spoke with daughter Denise King who is in agreement with her mother going to [facility]. Daughter states that she will talk with her mother and notify CM with decision. CM went back to patients room and patient states that she is agreeable to go to [facility] for rehab. CM spoke with Denise King at [facility] to verify that patient is able to come to the facility. Facility has initiated [facility]. TOC will continue to follow for placement.   [facility] will accept patient as soon as they get authorization. Denise King will call CM with authorization approval. Covid test has been requested. CM has verified that order has been entered.   Expected Discharge Plan: Skilled Nursing Facility Barriers to Discharge: No Barriers Identified  Expected Discharge Plan and Services Expected Discharge Plan: [place] In-house Referral: NA Discharge Planning Services: CM Consult Post Acute Care Choice: [place] Living arrangements for the past 2 months: Single Family Home Expected Discharge Date: 02/17/21               DME Arranged: 3-N-1 DME Agency: [employer] Date DME Agency Contacted: 02/19/21 Time DME Agency Contacted: [phone] Representative spoke with at DME Agency: [place]: PT,OT,RN,Nurse's Aide [place]: [facility]) Date [place]: 02/19/21 Time [place]: [phone] Representative spoke  with at [place]: [place]   Social Determinants of Health ([place]) Interventions    Readmission Risk Interventions Readmission Risk Prevention Plan 07/10/2020  Transportation Screening Complete  PCP or Specialist Appt within 3-5 Days Complete  HRI or [place] Complete  Social Work Consult for [place] Planning/Counseling Complete  Palliative Care Screening Not Applicable  Some recent data might be hidden

## 2021-02-21 DIAGNOSIS — Z7401 Bed confinement status: Secondary | ICD-10-CM | POA: Diagnosis not present

## 2021-02-21 DIAGNOSIS — I5032 Chronic diastolic (congestive) heart failure: Secondary | ICD-10-CM | POA: Diagnosis not present

## 2021-02-21 DIAGNOSIS — D649 Anemia, unspecified: Secondary | ICD-10-CM | POA: Diagnosis not present

## 2021-02-21 DIAGNOSIS — K219 Gastro-esophageal reflux disease without esophagitis: Secondary | ICD-10-CM | POA: Diagnosis not present

## 2021-02-21 DIAGNOSIS — R69 Illness, unspecified: Secondary | ICD-10-CM | POA: Diagnosis not present

## 2021-02-21 DIAGNOSIS — Z720 Tobacco use: Secondary | ICD-10-CM | POA: Diagnosis not present

## 2021-02-21 DIAGNOSIS — M2550 Pain in unspecified joint: Secondary | ICD-10-CM | POA: Diagnosis not present

## 2021-02-21 DIAGNOSIS — S3210XD Unspecified fracture of sacrum, subsequent encounter for fracture with routine healing: Secondary | ICD-10-CM | POA: Diagnosis not present

## 2021-02-21 DIAGNOSIS — G9341 Metabolic encephalopathy: Secondary | ICD-10-CM | POA: Diagnosis not present

## 2021-02-21 DIAGNOSIS — J449 Chronic obstructive pulmonary disease, unspecified: Secondary | ICD-10-CM | POA: Diagnosis not present

## 2021-02-21 DIAGNOSIS — J9611 Chronic respiratory failure with hypoxia: Secondary | ICD-10-CM | POA: Diagnosis not present

## 2021-02-21 DIAGNOSIS — G894 Chronic pain syndrome: Secondary | ICD-10-CM | POA: Diagnosis not present

## 2021-02-21 DIAGNOSIS — Z743 Need for continuous supervision: Secondary | ICD-10-CM | POA: Diagnosis not present

## 2021-02-21 LAB — BASIC METABOLIC PANEL
Anion gap: 11 (ref 5–15)
BUN: 12 mg/dL (ref 8–23)
CO2: 28 mmol/L (ref 22–32)
Calcium: 9 mg/dL (ref 8.9–10.3)
Chloride: 98 mmol/L (ref 98–111)
Creatinine, Ser: 0.92 mg/dL (ref 0.44–1.00)
GFR, Estimated: >60 mL/min (ref >60)
Glucose, Bld: 99 mg/dL (ref 70–99)
Potassium: 4.1 mmol/L (ref 3.5–5.1)
Sodium: 137 mmol/L (ref 135–145)

## 2021-02-21 LAB — CBC
HCT: 34.1 % — ABNORMAL LOW (ref 36.0–46.0)
Hemoglobin: 10.7 g/dL — ABNORMAL LOW (ref 12.0–15.0)
MCH: 30 pg (ref 26.0–34.0)
MCHC: 31.4 g/dL (ref 30.0–36.0)
MCV: 95.5 fL (ref 80.0–100.0)
Platelets: 346 10*3/uL (ref 150–400)
RBC: 3.57 MIL/uL — ABNORMAL LOW (ref 3.87–5.11)
RDW: 11.8 % (ref 11.5–15.5)
WBC: 11.7 10*3/uL — ABNORMAL HIGH (ref 4.0–10.5)
nRBC: 0 % (ref 0.0–0.2)

## 2021-02-21 MED ORDER — DICLOFENAC SODIUM 1 % EX GEL: 2.0000 g | Freq: Four times a day (QID) | CUTANEOUS | Status: AC | PRN

## 2021-02-21 NOTE — TOC Transition Note (Signed)
Transition of Care [facility]) - CM/SW Discharge Note   Patient Details  Name: Denise King MRN: [id number] Date of Birth: 20-Jan-1951  Transition of Care [facility]) CM/SW Contact:  Angelita Ingles, RN Phone Number: [phone]  02/21/2021, 2:01 PM   Clinical Narrative:    Patient to be transferred to [facility] in [place]. CM attempted to call daughter with no answer. Message has been left. Patient has been updated and message has been sent to primary nurse. Transport has been set up with [facility].  No other needs noted TOC will sign off.   Please call report to [facility] Room # B13 Bed 2   Final next level of care: Skilled Nursing Facility Barriers to Discharge: No Barriers Identified   Patient Goals and CMS Choice Patient states their goals for this hospitalization and ongoing recovery are:: [place] to be able to take care of husband CMS Medicare.gov Compare Post Acute Care list provided to:: Patient Choice offered to / list presented to : Patient  Discharge Placement              Patient chooses bed at:  [facility]) Patient to be transferred to facility by: [place] Name of family member notified: Laretta Alstrom daughter no answer message left patienthas been updated Patient and family notified of of transfer: 02/21/21 (messaage left)  Discharge Plan and Services In-house Referral: NA Discharge Planning Services: CM Consult Post Acute Care Choice: [place]          DME Arranged: N/A DME Agency: NA Date DME Agency Contacted: 02/19/21 Time DME Agency Contacted: [phone] Representative spoke with at DME Agency: [place]: PT,OT,RN,Nurse's Aide [place]: NA Date [place]: 02/19/21 Time [place]: 1613 Representative spoke with at [place]: [place]  Social Determinants of Health ([place]) Interventions     Readmission Risk Interventions Readmission Risk Prevention Plan 07/10/2020   Transportation Screening Complete  PCP or Specialist Appt within 3-5 Days Complete  HRI or Home Care Consult Complete  Social Work Consult for [place] Planning/Counseling Complete  Palliative Care Screening Not Applicable  Some recent data might be hidden

## 2021-02-21 NOTE — Progress Notes (Signed)
Report called to [facility].

## 2021-02-21 NOTE — Plan of Care (Signed)
  Problem: Education: Goal: Knowledge of General Education information will improve Description: Including pain rating scale, medication(s)/side effects and non-pharmacologic comfort measures Outcome: Adequate for Discharge   Problem: Health Behavior/Discharge Planning: Goal: Ability to manage health-related needs will improve Outcome: Adequate for Discharge   Problem: Clinical Measurements: Goal: Will remain free from infection Outcome: Adequate for Discharge Goal: Diagnostic test results will improve Outcome: Adequate for Discharge   Problem: Activity: Goal: Risk for activity intolerance will decrease Outcome: Adequate for Discharge   Problem: Coping: Goal: Level of anxiety will decrease Outcome: Adequate for Discharge   Problem: Elimination: Goal: Will not experience complications related to bowel motility Outcome: Adequate for Discharge Goal: Will not experience complications related to urinary retention Outcome: Adequate for Discharge   Problem: Pain Managment: Goal: General experience of comfort will improve Outcome: Adequate for Discharge   Problem: Safety: Goal: Ability to remain free from injury will improve Outcome: Adequate for Discharge   Problem: Skin Integrity: Goal: Risk for impaired skin integrity will decrease Outcome: Adequate for Discharge

## 2021-02-21 NOTE — Discharge Summary (Signed)
Physician Discharge Summary  Denise King YPP:[id number] DOB: Jan 09, 1951 DOA: 02/09/2021  PCP: Denise Borg, MD  Admit date: 02/09/2021 Discharge date: 02/21/2021  Time spent: 40 minutes  Recommendations for Outpatient Follow-up:  1. Follow outpatient CBC/CMP 2. Follow TSH/free T4 in about 4 weeks 3. Follow pain from L hip/posterior thigh - bilateral sacral ala fractures - consider orthopedic follow up outpatient if persistent pain that is not improving   Discharge Diagnoses:  Principal Problem:   Acute metabolic encephalopathy Active Problems:   GERD   Depression   Tobacco abuse   Severe sepsis ([place])   Acute cystitis   Left hip pain   Discharge Condition: stable  Diet recommendation: heart healthy  Filed Weights   02/13/21 0500 02/16/21 0500 02/21/21 0551  Weight: 56.2 kg 56 kg 56.5 kg    History of present illness:  70 y.o.femalewith medical history significant forchronic hypoxic respiratory failure on 2 L continuous supplemental oxygen, COPD, chronic pain syndrome on chronic opioid therapy,chronic diastolic heart failure, depression,who was [facility] to [facility] on[facility]22 with altered mental status. Thought to be secondary to UTI.  She's improved after treatment with antibiotics.  She had left posterior thigh pain and had imaging notable for bilateral sacral alar fractures in addition to muscle strains.  Plan for discharge to SNF 2/25 pending insurance auth.  See below for additional details.  Hospital Course:  Acute metabolic encephalopathy This has resolved. Was most likely secondary to urinary tract infection. CT scan of the head did not show any acute findings.  TSH noted to be 0.175. Free T4 is noted to be normal at 0.79. Likely sick euthyroid. Needs repeat check outpatient.  Delirium precautions  E. coli urinary tract infection with sepsis present on admission/staph hominis bacteremia Initially placed on ceftriaxone. Based  on sensitivities she was changed over to Keflex. She has completed 7-day treatment. Staph hominis noted on blood cultures (in 1/2 sets). Likely contaminant.  Follow clinically.  Left hip  posterior thigh pain Fracture of sacral ala No clear etiology for same. No obvious lesions noted on examination. CT hip did not show any fractures. The pain is mostly in the muscle area posteriorly. CTdid cover the area of concern and did not show any abnormalities. Straight leg raising test was negative. Patient was started on Robaxin.  MRI L femur shows acute to subacute bilateral sacral alar fx, mild intramuscular edema within L adductor compartment (mild strain) Discussed with ortho informally, ok with WBAT, no need for follow up - consider follow up if persistent pain Continue therapy Improved pain with voltaren  Chronic hypoxic respiratory failure/history of COPD Chronically on 2 L of oxygen. Respiratory status seems to be stable at this time.  Acute kidney injury Resolved  History of GERD PPI  History of depression Continue with Wellbutrin and Zoloft. Cymbalta discontinued.  Normocytic anemia Hemoglobin is stable. No evidence of overt bleeding.  Chronic diastolic CHF Most recent echocardiogram done last July showed normal systolic function. Appears to be reasonably compensated at this time.   Chronic tobacco abuse She was counseled.  Chronic pain syndrome Patient noted to be on MS Contin which is being continued. PDMP database was reviewed. Patient also on oxycodone 10 mg. Was also on buprenorphine patch but apparently not taking it anymore.  History of lymphocytic colitis On budesonide. Follows with Dr. Carlean Purl with gastroenterology.  Procedures: none  Consultations:  none  Discharge Exam: Vitals:   02/20/21 2142 02/21/21 0545  BP: 118/75 107/64  Pulse: 79  76  Resp: 18 17  Temp: 98.6 F (37 C) 98.6 F (37 C)  SpO2: 92% 90%   Feels well,  no complaints today Pain is not present  General: No acute distress.  Sitting up on edge of bed. Cardiovascular: Heart sounds show Denise King regular rate, and rhythm Lungs: Clear to auscultation bilaterally  Abdomen: Soft, nontender, nondistended with normal active bowel sounds. No masses. No hepatosplenomegaly. Neurological: Alert and oriented 3. Moves all extremities 4. Cranial nerves II through XII grossly intact. Skin: Warm and dry. No rashes or lesions. Extremities: No clubbing or cyanosis. No edema.   Discharge Instructions   Discharge Instructions    Call MD for:  difficulty breathing, headache or visual disturbances   Complete by: As directed    Call MD for:  extreme fatigue   Complete by: As directed    Call MD for:  persistant dizziness or light-headedness   Complete by: As directed    Call MD for:  persistant nausea and vomiting   Complete by: As directed    Call MD for:  severe uncontrolled pain   Complete by: As directed    Call MD for:  temperature >100.4   Complete by: As directed    Diet general   Complete by: As directed    Discharge instructions   Complete by: As directed    You were seen for Denise King urinary tract infection.  You were treated with appropriate antibiotics.  You have bilateral sacral ala fractures.  You can weight bear as tolerated.  If you have continued pain or issues, you should follow up with orthopedics outpatient.  You had abnormal thyroid function tests, you should follow these up outpatient after Denise King few weeks.   Return for new, recurrent, or worsening symptoms.  Please ask your PCP to request records from this hospitalization so they know what was done and what the next steps will be.  Please review instructions on the discharge summary.  You were cared for by Denise King hospitalist during your hospital stay. If you have any questions about your discharge medications or the care you received while you were in the hospital after you are discharged, you can  call the unit and asked to speak with the hospitalist on call if the hospitalist that took care of you is not available. Once you are discharged, your primary care physician will handle any further medical issues. Please note that NO REFILLS for any discharge medications will be authorized once you are discharged, as it is imperative that you return to your primary care physician (or establish Morgane Joerger relationship with Zoejane Gaulin primary care physician if you do not have one) for your aftercare needs so that they can reassess your need for medications and monitor your lab values. If you do not have Adamae Ricklefs primary care physician, you can call [phone] for Zigmond Trela physician referral.   Increase activity slowly   Complete by: As directed      Allergies as of 02/21/2021      Reactions   Atorvastatin Nausea And Vomiting   Levofloxacin Other (See Comments)   Reaction: achilles tendon pain   Omnicef [cefdinir] Diarrhea   Uncontrollable diarrhea   Trazodone And Nefazodone Other (See Comments)   "Felt like I was going to faint"   Sulfa Antibiotics Rash   Sulfonamide Derivatives Rash      Medication List    STOP taking these medications   folic acid 1 MG tablet Commonly known as: FOLVITE   multivitamin with minerals  Tabs tablet   potassium chloride 10 MEQ CR capsule Commonly known as: MICRO-K   vitamin B-12 100 MCG tablet Commonly known as: CYANOCOBALAMIN   Vitamin D 50 MCG (2000 UT) tablet     TAKE these medications   budesonide 3 MG 24 hr capsule Commonly known as: ENTOCORT EC TAKE 3 CAPSULES ONCE DAILY. What changed: See the new instructions.   buPROPion 150 MG 12 hr tablet Commonly known as: WELLBUTRIN SR Take 1 tablet (150 mg total) by mouth daily.   denosumab 60 MG/ML Sosy injection Commonly known as: PROLIA Inject 60 mg into the skin every 6 (six) months.   diclofenac Sodium 1 % Gel Commonly known as: VOLTAREN Apply 2 g topically 4 (four) times daily as needed (for joint pain).   hyoscyamine  0.125 MG Tbdp disintergrating tablet Commonly known as: ANASPAZ Place 0.125 mg under the tongue every 6 (six) hours as needed ([name] cramps).   LOMOTIL PO Take 1 tablet by mouth 2 (two) times daily as needed (diarrhea).   methocarbamol 500 MG tablet Commonly known as: ROBAXIN Take 1 tablet (500 mg total) by mouth every 6 (six) hours as needed for muscle spasms.   methocarbamol 750 MG tablet Commonly known as: ROBAXIN Take 1 tablet (750 mg total) by mouth 3 (three) times daily.   morphine 15 MG 12 hr tablet Commonly known as: MS CONTIN Take 1 tablet (15 mg total) by mouth 3 (three) times daily.   nicotine 21 mg/24hr patch Commonly known as: NICODERM CQ - dosed in mg/24 hours Place 1 patch (21 mg total) onto the skin daily.   nitroGLYCERIN 0.4 MG SL tablet Commonly known as: NITROSTAT Place 1 tablet (0.4 mg total) under the tongue every 5 (five) minutes as needed for chest pain.   ondansetron 4 MG disintegrating tablet Commonly known as: ZOFRAN-ODT DISSOLVE 1 TABLET ON TONGUE EVERY 8 HOURS AS NEEDED FOR NAUSEA/VOMITING. What changed: See the new instructions.   Oxycodone HCl 10 MG Tabs Take 1 tablet (10 mg total) by mouth 4 (four) times daily as needed. What changed:   when to take this  reasons to take this   pantoprazole 40 MG tablet Commonly known as: PROTONIX TAKE (1) TABLET TWICE Zuha Dejonge DAY BEFORE MEALS. What changed:   how much to take  how to take this  when to take this  reasons to take this  additional instructions   rosuvastatin 5 MG tablet Commonly known as: CRESTOR Take 1 tablet (5 mg total) by mouth 3 (three) times Nathen Balaban week.   sertraline 100 MG tablet Commonly known as: ZOLOFT Take 2 tablets (200 mg total) by mouth at bedtime.   spironolactone 25 MG tablet Commonly known as: ALDACTONE Take 0.5 tablets (12.5 mg total) by mouth daily. What changed:   when to take this  reasons to take this   valACYclovir 500 MG tablet Commonly known as:  VALTREX Take 500 mg by mouth 2 (two) times daily as needed (flare ups).            Durable Medical Equipment  (From admission, onward)         Start     Ordered   02/19/21 1554  For home use only DME 3 n 1  Once        02/19/21 1553         Allergies  Allergen Reactions  . Atorvastatin Nausea And Vomiting  . Levofloxacin Other (See Comments)    Reaction: achilles tendon pain  . Holmes.Ano [  Cefdinir] Diarrhea    Uncontrollable diarrhea  . Trazodone And Nefazodone Other (See Comments)    "Felt like I was going to faint"  . Sulfa Antibiotics Rash  . Sulfonamide Derivatives Rash    Follow-up Information    Denise Borg, MD. Schedule an appointment as soon as possible for Dariah Mcsorley visit in 1 week(s).   Specialties: Internal Medicine, Radiology Contact information: [place] [address] [phone]        Home, [facility] Follow up.   Specialty: Home Health Services Why: your home health has been set up with [facility]. The office will call you to set up  Contact information: [place] [address] [phone]                The results of significant diagnostics from this hospitalization (including imaging, microbiology, ancillary and laboratory) are listed below for reference.    Significant Diagnostic Studies: DG Thoracic Spine 2 View  Result Date: 01/28/2021 CLINICAL DATA:  Pain status post fall EXAM: THORACIC SPINE 2 VIEWS COMPARISON:  None. FINDINGS: There is an S-shaped curvature of the thoracolumbar spine. No acute compression fracture. There is osteopenia. Degenerative changes are noted of the thoracic spine. IMPRESSION: Negative. Electronically Signed   By: Amir Holster M.D.   On: 01/28/2021 17:57   DG Lumbar Spine 2-3 Views  Result Date: 01/28/2021 CLINICAL DATA:  Pain status post fall EXAM: LUMBAR SPINE - 2-3 VIEW COMPARISON:  None. FINDINGS: The patient is status post prior L4 through S1 posterior  fusion. There is severe disc height loss at the L3-L4 disc space. There is no definite acute compression fracture. Vascular calcifications are noted. IMPRESSION: Negative. Electronically Signed   By: Raliyah Holster M.D.   On: 01/28/2021 17:55   DG Sacrum/Coccyx  Result Date: 01/28/2021 CLINICAL DATA:  70 year old female with fall and pain in the tailbone. EXAM: SACRUM AND COCCYX - 2+ VIEW COMPARISON:  CT abdomen pelvis dated 06/11/2020. FINDINGS: No acute fracture. Probable old healed fracture of the mid sacrum. The bones are osteopenic. L4-S1 posterior fusion. Partially visualized right femoral hardware. The soft tissues are unremarkable. IMPRESSION: No acute fracture. Electronically Signed   By: Anner Crete M.D.   On: 01/28/2021 21:13   CT HEAD WO CONTRAST  Result Date: 02/11/2021 CLINICAL DATA:  Encephalopathy EXAM: CT HEAD WITHOUT CONTRAST TECHNIQUE: Contiguous axial images were obtained from the base of the skull through the vertex without intravenous contrast. COMPARISON:  01/28/2021 FINDINGS: Brain: There is no mass, hemorrhage or extra-axial collection. The size and configuration of the ventricles and extra-axial CSF spaces are normal. There is hypoattenuation of the white matter, most commonly indicating chronic small vessel disease. Vascular: Atherosclerotic calcification of the internal carotid arteries at the skull base. No abnormal hyperdensity of the major intracranial arteries or dural venous sinuses. Skull: The visualized skull base, calvarium and extracranial soft tissues are normal. Sinuses/Orbits: No fluid levels or advanced mucosal thickening of the visualized paranasal sinuses. No mastoid or middle ear effusion. The orbits are normal. IMPRESSION: Chronic small vessel disease without acute intracranial abnormality. Electronically Signed   By: Ulyses Jarred M.D.   On: 02/11/2021 21:54   CT Head Wo Contrast  Result Date: 01/28/2021 CLINICAL DATA:  Fall, hit back of head EXAM: CT  HEAD WITHOUT CONTRAST TECHNIQUE: Contiguous axial images were obtained from the base of the skull through the vertex without intravenous contrast. COMPARISON:  07/04/2020 FINDINGS: Brain: There is no  acute intracranial hemorrhage, mass effect, or edema. Gray-white differentiation is preserved. There is no extra-axial fluid collection. Ventricles and sulci are within normal limits in size and configuration. Vascular: There is atherosclerotic calcification at the skull base. Skull: Calvarium is unremarkable. Sinuses/Orbits: No acute finding. Other: None. IMPRESSION: No evidence of acute traumatic injury. Electronically Signed   By: Macy Mis M.D.   On: 01/28/2021 21:23   CT HIP LEFT WO CONTRAST  Result Date: 02/12/2021 CLINICAL DATA:  Left hip pain after fall on 02/08/2021 EXAM: CT OF THE LEFT HIP WITHOUT CONTRAST TECHNIQUE: Multidetector CT imaging of the left hip was performed according to the standard protocol. Multiplanar CT image reconstructions were also generated. COMPARISON:  X-ray 02/10/2021 FINDINGS: Bones/Joint/Cartilage No acute fracture. No dislocation. Mild left hip joint space narrowing. No appreciable hip joint effusion. Pubic symphysis and visualized portion of the left sacroiliac joint are intact without diastasis. Osseous structures appear slightly demineralized. No suspicious lytic or sclerotic bone lesion. Ligaments Suboptimally assessed by CT. Muscles and Tendons No acute musculotendinous abnormality by CT. Soft tissues No soft tissue fluid collection or hematoma. No left inguinal lymphadenopathy. Prominent atherosclerotic vascular calcifications. IMPRESSION: 1. No acute fracture or dislocation of the left hip. 2. Mild left hip osteoarthritis. Electronically Signed   By: Davina Poke D.O.   On: 02/12/2021 08:09   MR FEMUR LEFT WO CONTRAST  Result Date: 02/18/2021 CLINICAL DATA:  Posterior left thigh pain. Limping. Fall on 02/08/2021 EXAM: MR OF THE LEFT FEMUR WITHOUT CONTRAST  TECHNIQUE: Multiplanar, multisequence MR imaging of the left femur was performed. No intravenous contrast was administered. COMPARISON:  CT left hip 02/11/2021 FINDINGS: Technical note: Patient was unable to tolerate further imaging and the postcontrast portion of the exam was not performed. Bones/Joint/Cartilage Partially visualized at the edge of the field of view on full-field coronal images are acute to subacute bilateral sacral alar fractures (series 20, images 5-9) with surrounding bone marrow edema. The visualized SI joints are intact without diastasis. No SI joint effusion. Pubic symphysis intact. No acute fracture or dislocation of the left femur. Negative for left femoral head avascular necrosis. No significant left hip joint effusion. Mild left hip osteoarthritis. Prior ORIF of the proximal right femur without evidence of hardware complication. No suspicious marrow replacing bone lesion. Ligaments Intact. Muscles and Tendons Tendinous structures appear intact without significant tendinosis or tear. No bursal fluid collections. Mild intramuscular edema within the left adductor compartment including the adductor longus and brevis muscles, which may reflect Shanan Mcmiller mild muscle strain. No evidence of muscle tear or intramuscular fluid collection. Soft tissues No soft tissue edema or fluid collection. No left inguinal lymphadenopathy. IMPRESSION: 1. Acute-to-subacute bilateral sacral alar fractures. 2. Mild intramuscular edema within the left adductor compartment, which may reflect Winn Muehl mild muscle strain. 3. Mild left hip osteoarthritis. Electronically Signed   By: Davina Poke D.O.   On: 02/18/2021 08:09   DG Chest Port 1 View  Result Date: 02/09/2021 CLINICAL DATA:  Lethargy yesterday. Syncopal episode today with some confusion. EXAM: PORTABLE CHEST 1 VIEW COMPARISON:  07/08/2020 FINDINGS: Cardiac silhouette is normal in size. No mediastinal or hilar masses. Prominent bronchovascular markings. Additional  opacity noted the lung bases, greater on the right, consistent with atelectasis and/or scarring. No convincing pneumonia and no evidence of pulmonary edema. No pleural effusion or pneumothorax. Skeletal structures demineralized, but grossly intact. IMPRESSION: 1. No acute cardiopulmonary disease. Electronically Signed   By: Lajean Manes M.D.   On: 02/09/2021 16:46   DG  Foot Complete Right  Result Date: 01/28/2021 CLINICAL DATA:  Pain status post fall EXAM: RIGHT FOOT COMPLETE - 3+ VIEW COMPARISON:  None. FINDINGS: There is an acute oblique minimally displaced fracture through the proximal phalanx of the fourth digit. There is no dislocation. IMPRESSION: Acute oblique minimally displaced fracture through the proximal phalanx of the fourth digit. Electronically Signed   By: Anshika Holster M.D.   On: 01/28/2021 18:01   DG HIP UNILAT WITH PELVIS 2-3 VIEWS LEFT  Result Date: 02/10/2021 CLINICAL DATA:  Fall, left hip pain. EXAM: DG HIP (WITH OR WITHOUT PELVIS) 2-3V LEFT COMPARISON:  None. FINDINGS: Intramedullary nail fixation of the right femur partially visualized on frontal view. Frontal view of the right hip is grossly unremarkable. Lumbosacral surgical hardware noted. There is no evidence of left hip fracture or dislocation. Frontal view of the pelvis demonstrates no acute displaced fracture or diastasis. Other than degenerative changes of the lumbar spine, no severe arthropathy. No aggressive bone abnormality. Limited evaluation of the lumbar spine on frontal view due to overlying bowel gas. Extensive vascular calcifications. IMPRESSION: 1. No acute displaced fracture or dislocation. 2. Extensive vascular calcifications. Electronically Signed   By: Iven Finn M.D.   On: 02/10/2021 00:16    Microbiology: Recent Results (from the past 240 hour(s))  SARS CORONAVIRUS 2 (TAT 6-24 HRS) Nasopharyngeal Nasopharyngeal Swab     Status: None   Collection Time: 02/20/21  9:56 AM   Specimen:  Nasopharyngeal Swab  Result Value Ref Range Status   SARS Coronavirus 2 NEGATIVE NEGATIVE Final    Comment: (NOTE) SARS-CoV-2 target nucleic acids are NOT DETECTED.  The SARS-CoV-2 RNA is generally detectable in upper and lower respiratory specimens during the acute phase of infection. Negative results do not preclude SARS-CoV-2 infection, do not rule out co-infections with other pathogens, and should not be used as the sole basis for treatment or other patient management decisions. Negative results must be combined with clinical observations, patient history, and epidemiological information. The expected result is Negative.  Fact Sheet for Patients: SugarRoll.be  Fact Sheet for Healthcare Providers: https://www.woods-mathews.com/  This test is not yet approved or cleared by the [place] FDA and  has been authorized for detection and/or diagnosis of SARS-CoV-2 by FDA under an Emergency Use Authorization (EUA). This EUA will remain  in effect (meaning this test can be used) for the duration of the COVID-19 declaration under Se ction 564(b)(1) of the Act, 21 U.S.C. section 360bbb-3(b)(1), unless the authorization is terminated or revoked sooner.  Performed at [facility], [place] [address] [place], [place] [address]      Labs: Basic Metabolic Panel: Recent Labs  Lab 02/17/21 0330 02/20/21 0159 02/21/21 0320  NA 136 135 137  K 3.7 3.8 4.1  CL 95* 98 98  CO2 31 28 28   GLUCOSE 101* 100* 99  BUN 10 12 12   CREATININE 0.85 0.86 0.92  CALCIUM 8.8* 8.8* 9.0  MG  --  2.3  --   PHOS  --  4.8*  --    Liver Function Tests: Recent Labs  Lab 02/17/21 0330 02/20/21 0159  AST 15 14*  ALT 8 8  ALKPHOS 74 85  BILITOT 0.3 0.3  PROT 6.1* 6.1*  ALBUMIN 3.0* 3.2*   No results for input(s): LIPASE, AMYLASE in the last 168 hours. No results for input(s): AMMONIA in the last 168 hours. CBC: Recent Labs  Lab 02/17/21 0330  02/20/21 0159 02/21/21 0320  WBC 9.4 11.0* 11.7*  NEUTROABS  --  5.4  --   HGB 10.9* 10.8* 10.7*  HCT 32.7* 33.5* 34.1*  MCV 94.0 94.6 95.5  PLT 352 331 346   Cardiac Enzymes: No results for input(s): CKTOTAL, CKMB, CKMBINDEX, TROPONINI in the last 168 hours. BNP: BNP (last 3 results) Recent Labs    07/08/20 0232 07/09/20 0247 07/10/20 0252  BNP 230.8* 97.5 96.9    ProBNP (last 3 results) No results for input(s): PROBNP in the last 8760 hours.  CBG: No results for input(s): GLUCAP in the last 168 hours.     Signed:  Fayrene Helper MD.  [facility] 02/21/2021, 8:48 AM

## 2021-02-24 DIAGNOSIS — R6520 Severe sepsis without septic shock: Secondary | ICD-10-CM | POA: Diagnosis not present

## 2021-02-24 DIAGNOSIS — R69 Illness, unspecified: Secondary | ICD-10-CM | POA: Diagnosis not present

## 2021-02-24 DIAGNOSIS — Z720 Tobacco use: Secondary | ICD-10-CM | POA: Diagnosis not present

## 2021-02-24 DIAGNOSIS — I509 Heart failure, unspecified: Secondary | ICD-10-CM | POA: Diagnosis not present

## 2021-02-24 DIAGNOSIS — H2000 Unspecified acute and subacute iridocyclitis: Secondary | ICD-10-CM | POA: Diagnosis not present

## 2021-02-24 DIAGNOSIS — G8929 Other chronic pain: Secondary | ICD-10-CM | POA: Diagnosis not present

## 2021-02-24 DIAGNOSIS — A419 Sepsis, unspecified organism: Secondary | ICD-10-CM | POA: Diagnosis not present

## 2021-02-24 DIAGNOSIS — K219 Gastro-esophageal reflux disease without esophagitis: Secondary | ICD-10-CM | POA: Diagnosis not present

## 2021-02-26 DIAGNOSIS — R69 Illness, unspecified: Secondary | ICD-10-CM | POA: Diagnosis not present

## 2021-02-26 DIAGNOSIS — Z20822 Contact with and (suspected) exposure to covid-19: Secondary | ICD-10-CM | POA: Diagnosis not present

## 2021-02-26 DIAGNOSIS — F17200 Nicotine dependence, unspecified, uncomplicated: Secondary | ICD-10-CM | POA: Diagnosis not present

## 2021-02-26 DIAGNOSIS — Z79899 Other long term (current) drug therapy: Secondary | ICD-10-CM | POA: Diagnosis not present

## 2021-02-26 DIAGNOSIS — R5383 Other fatigue: Secondary | ICD-10-CM | POA: Diagnosis not present

## 2021-02-26 DIAGNOSIS — M25561 Pain in right knee: Secondary | ICD-10-CM | POA: Diagnosis not present

## 2021-02-26 DIAGNOSIS — M129 Arthropathy, unspecified: Secondary | ICD-10-CM | POA: Diagnosis not present

## 2021-02-26 DIAGNOSIS — E559 Vitamin D deficiency, unspecified: Secondary | ICD-10-CM | POA: Diagnosis not present

## 2021-02-26 DIAGNOSIS — F17210 Nicotine dependence, cigarettes, uncomplicated: Secondary | ICD-10-CM | POA: Diagnosis not present

## 2021-02-26 DIAGNOSIS — Z7689 Persons encountering health services in other specified circumstances: Secondary | ICD-10-CM | POA: Diagnosis not present

## 2021-03-03 DIAGNOSIS — I509 Heart failure, unspecified: Secondary | ICD-10-CM | POA: Diagnosis not present

## 2021-03-09 DIAGNOSIS — I509 Heart failure, unspecified: Secondary | ICD-10-CM | POA: Diagnosis not present

## 2021-03-10 ENCOUNTER — Other Ambulatory Visit (HOSPITAL_COMMUNITY): Payer: Self-pay | Admitting: Psychiatry

## 2021-03-10 DIAGNOSIS — S92414A Nondisplaced fracture of proximal phalanx of right great toe, initial encounter for closed fracture: Secondary | ICD-10-CM | POA: Diagnosis not present

## 2021-03-12 ENCOUNTER — Ambulatory Visit (INDEPENDENT_AMBULATORY_CARE_PROVIDER_SITE_OTHER): Payer: Medicare HMO | Admitting: Internal Medicine

## 2021-03-12 ENCOUNTER — Encounter: Payer: Self-pay | Admitting: Internal Medicine

## 2021-03-12 ENCOUNTER — Other Ambulatory Visit: Payer: Self-pay

## 2021-03-12 VITALS — BP 120/80 | HR 87 | Temp 97.6°F | Ht 63.0 in | Wt 112.0 lb

## 2021-03-12 DIAGNOSIS — I700 Atherosclerosis of aorta: Secondary | ICD-10-CM

## 2021-03-12 DIAGNOSIS — R739 Hyperglycemia, unspecified: Secondary | ICD-10-CM | POA: Diagnosis not present

## 2021-03-12 DIAGNOSIS — A6000 Herpesviral infection of urogenital system, unspecified: Secondary | ICD-10-CM | POA: Insufficient documentation

## 2021-03-12 DIAGNOSIS — N3000 Acute cystitis without hematuria: Secondary | ICD-10-CM | POA: Diagnosis not present

## 2021-03-12 DIAGNOSIS — R69 Illness, unspecified: Secondary | ICD-10-CM | POA: Diagnosis not present

## 2021-03-12 DIAGNOSIS — E785 Hyperlipidemia, unspecified: Secondary | ICD-10-CM | POA: Insufficient documentation

## 2021-03-12 DIAGNOSIS — F17210 Nicotine dependence, cigarettes, uncomplicated: Secondary | ICD-10-CM | POA: Diagnosis not present

## 2021-03-12 NOTE — Progress Notes (Signed)
Patient ID: Denise King, female   DOB: June 22, 1951, 70 y.o.   MRN: [id number]        Chief Complaint: follow up recent hospn with AMS, UTI, severe sepsis, after prior to that fall with resulting bilateral alar sacral fractures       HPI:  Denise King is a 70 y.o. female here with above, after f/u with ortho yesterday and advised for conservative management per pt.  Successfully tx with antibx, did well post d/c but now with 2 days onset recurring mild dysuria and frequency, thinking she may have recurrent uti.  Did also d/c with mild leukocytosis and recommendation for f/u cbc, cmp.  Denies hyper or hypo thyroid symptoms such as voice, skin or hair change, had abnormal tsh c/w likely sick euthyroid.  Pt denies chest pain, increased sob or doe, wheezing, orthopnea, PND, increased LE swelling, palpitations, dizziness or syncope.   Pt denies polydipsia, polyuria, Denies worsening focal neuro s/s.   Pt denies fever, wt loss, night sweats, loss of appetite, or other constitutional symptoms       Wt Readings from Last 3 Encounters:  03/12/21 112 lb (50.8 kg)  02/21/21 124 lb 9 oz (56.5 kg)  10/22/20 115 lb (52.2 kg)   BP Readings from Last 3 Encounters:  03/12/21 120/80  02/21/21 125/78  01/31/21 130/70         Past Medical History:  Diagnosis Date  . Acute cystitis   . Acute respiratory failure ([place])   . Allergy    as a child grew out of them  . Anemia    pernicious anemia  . Anxiety   . Arthritis   . Back pain   . Blood transfusion   . CAP (community acquired pneumonia)   . CHF (congestive heart failure) ([place])   . Chronic pain syndrome   . Common bile duct stone   . Depression   . Diverticulosis of colon   . Duodenal ulcer hemorrhagic-after biliary sphincterotomy   . Dysthymia   . Esophagitis   . EtOH dependence ([place])   . Gastritis   . GERD (gastroesophageal reflux disease)   . H/O chest pain Dec. 2013   no work up done  . Hx of colonic polyps   . Hypertension    . Irritable bowel syndrome   . Lymphocytic colitis 02/16/2020  . Osteopenia   . Osteoporosis   . Pneumonia 2019  . Polypharmacy   . Reflux esophagitis   . Right sided sciatica 12/22/2017  . Tobacco use disorder   . Unspecified chronic bronchitis ([place])   . Vertigo   . Vitamin B12 deficiency   . Wears glasses    Past Surgical History:  Procedure Laterality Date  . APPENDECTOMY    . BACK SURGERY  10-09   Dr. Patrice Paradise  . BIOPSY  02/14/2020   Procedure: BIOPSY;  Surgeon: Gatha Mayer, MD;  Location: [facility];  Service: Endoscopy;;  . CARPAL TUNNEL RELEASE Right 08/14/2014   Procedure: RIGHT CARPAL TUNNEL RELEASE AND INJECT LEFT THUMB;  Surgeon: Daryll Brod, MD;  Location: [place];  Service: Orthopedics;  Laterality: Right;  . CHOLECYSTECTOMY N/A 02/22/2020   Procedure: LAPAROSCOPIC CHOLECYSTECTOMY WITH INTRAOPERATIVE CHOLANGIOGRAM;  Surgeon: Kieth Brightly Arta Bruce, MD;  [facility];  Service: General;  Laterality: N/A;  . COLONOSCOPY    . COLONOSCOPY WITH PROPOFOL N/A 02/14/2020   Procedure: COLONOSCOPY WITH PROPOFOL;  Surgeon: Gatha Mayer, MD;  [facility];  Service: Endoscopy;  Laterality: N/A;  . ENDOSCOPIC RETROGRADE CHOLANGIOPANCREATOGRAPHY (ERCP) WITH PROPOFOL N/A 12/25/2019   Procedure: ENDOSCOPIC RETROGRADE CHOLANGIOPANCREATOGRAPHY (ERCP) WITH PROPOFOL;  Surgeon: Gatha Mayer, MD;  Location: [place];  Service: Gastroenterology;  Laterality: N/A;  . ESOPHAGOGASTRODUODENOSCOPY (EGD) WITH PROPOFOL N/A 01/06/2020   Procedure: ESOPHAGOGASTRODUODENOSCOPY (EGD) WITH PROPOFOL;  Surgeon: Irene Shipper, MD;  Location: [facility] END[facility]COPY;  Service: Endoscopy;  Laterality: N/A;  . HOT HEMOSTASIS N/A 01/06/2020   Procedure: HOT HEMOSTASIS (ARGON PLASMA COAGULATION/BICAP);  Surgeon: Irene Shipper, MD;  Location: [facility] ENDOSCOPY;  Service: Endoscopy;  Laterality: N/A;  . INTRAMEDULLARY (IM) NAIL INTERTROCHANTERIC Right 10/01/2018   Procedure: INTRAMEDULLARY (IM)  NAIL INTERTROCHANTRIC;  Surgeon: Thornton Park, MD;  Location: [facility];  Service: Orthopedics;  Laterality: Right;  . LAPAROSCOPY N/A 02/21/2013   Procedure: LAPAROSCOPY OPERATIVE;  Surgeon: Margarette Asal, MD;  Location: [place] [facility];  Service: Gynecology;  Laterality: N/A;  REQUESTING 5MM SCOPE WITH CAMERA  . LEFT HEART CATH AND CORONARY ANGIOGRAPHY N/A 05/11/2019   Procedure: LEFT HEART CATH AND CORONARY ANGIOGRAPHY;  Surgeon: Sherren Mocha, MD;  Location: [place] CV LAB;  Service: Cardiovascular;  Laterality: N/A;  . REMOVAL OF STONES  12/25/2019   Procedure: Karlyn Agee;  Surgeon: Gatha Mayer, MD;  Location: [facility] ENDOSCOPY;  Service: Gastroenterology;;  . Clide Deutscher  01/06/2020   Procedure: Clide Deutscher;  Surgeon: Irene Shipper, MD;  Location: [facility] ENDOSCOPY;  Service: Endoscopy;;  . Joan Mayans  12/25/2019   Procedure: SPHINCTEROTOMY;  Surgeon: Gatha Mayer, MD;  Location: [place];  Service: Gastroenterology;;  . TONSILLECTOMY    . TUBAL LIGATION    . UPPER GASTROINTESTINAL ENDOSCOPY      reports that she has been smoking cigarettes. She has a 30.00 pack-year smoking history. She has never used smokeless tobacco. She reports current alcohol use of about 3.0 standard drinks of alcohol per week. She reports that she does not use drugs. family history includes Alcohol abuse in her daughter; Bipolar disorder in her daughter; Colon cancer in her brother and father; Drug abuse in her daughter; Heart disease in her mother; Prostate cancer in her father; Skin cancer in her sister. Allergies  Allergen Reactions  . Atorvastatin Nausea And Vomiting  . Levofloxacin Other (See Comments)    Reaction: achilles tendon pain  . Omnicef [Cefdinir] Diarrhea    Uncontrollable diarrhea  . Trazodone And Nefazodone Other (See Comments)    "Felt like I was going to faint"  . Sulfa Antibiotics Rash  . Sulfonamide Derivatives Rash   Current Outpatient Medications on File Prior to Visit   Medication Sig Dispense Refill  . budesonide (ENTOCORT EC) 3 MG 24 hr capsule TAKE 3 CAPSULES ONCE DAILY. (Patient taking differently: Take 9 mg by mouth daily.) 90 capsule 1  . buPROPion (WELLBUTRIN SR) 150 MG 12 hr tablet Take 1 tablet (150 mg total) by mouth daily. 90 tablet 1  . denosumab (PROLIA) 60 MG/ML SOSY injection Inject 60 mg into the skin every 6 (six) months.    . diclofenac Sodium (VOLTAREN) 1 % GEL Apply 2 g topically 4 (four) times daily as needed (for joint pain).    . Diphenoxylate-Atropine (LOMOTIL PO) Take 1 tablet by mouth 2 (two) times daily as needed (diarrhea).    . hyoscyamine (ANASPAZ) 0.125 MG TBDP disintergrating tablet Place 0.125 mg under the tongue every 6 (six) hours as needed ([name] cramps).     . methocarbamol (ROBAXIN) 500 MG tablet Take 1 tablet (500 mg total) by mouth every 6 (  six) hours as needed for muscle spasms.    . methocarbamol (ROBAXIN) 750 MG tablet Take 1 tablet (750 mg total) by mouth 3 (three) times daily.    Marland Kitchen morphine (MS CONTIN) 15 MG 12 hr tablet Take 1 tablet (15 mg total) by mouth 3 (three) times daily. 30 tablet 0  . nicotine (NICODERM CQ - DOSED IN MG/24 HOURS) 21 mg/24hr patch Place 1 patch (21 mg total) onto the skin daily. 28 patch 0  . nitroGLYCERIN (NITROSTAT) 0.4 MG SL tablet Place 1 tablet (0.4 mg total) under the tongue every 5 (five) minutes as needed for chest pain. 25 tablet 3  . ondansetron (ZOFRAN-ODT) 4 MG disintegrating tablet DISSOLVE 1 TABLET ON TONGUE EVERY 8 HOURS AS NEEDED FOR NAUSEA/VOMITING. (Patient taking differently: Take 4 mg by mouth every 8 (eight) hours as needed for vomiting or nausea.) 15 tablet 0  . Oxycodone HCl 10 MG TABS Take 1 tablet (10 mg total) by mouth 4 (four) times daily as needed. 15 tablet 0  . pantoprazole (PROTONIX) 40 MG tablet TAKE (1) TABLET TWICE A DAY BEFORE MEALS. (Patient taking differently: Take 40 mg by mouth 2 (two) times daily as needed (acid reflux).) 60 tablet 11  . rosuvastatin  (CRESTOR) 5 MG tablet Take 1 tablet (5 mg total) by mouth 3 (three) times a week. 39 tablet 3  . sertraline (ZOLOFT) 100 MG tablet Take 2 tablets (200 mg total) by mouth at bedtime. 180 tablet 1  . spironolactone (ALDACTONE) 25 MG tablet Take 0.5 tablets (12.5 mg total) by mouth daily. (Patient taking differently: Take 12.5 mg by mouth daily as needed (swelling).) 45 tablet 3  . valACYclovir (VALTREX) 500 MG tablet Take 500 mg by mouth 2 (two) times daily as needed (flare ups).      No current facility-administered medications on file prior to visit.        ROS:  All others reviewed and negative.  Objective        PE:  BP 120/80   Pulse 87   Temp 97.6 F (36.4 C) (Oral)   Ht 5\' 3"  (1.6 m)   Wt 112 lb (50.8 kg)   SpO2 97%   BMI 19.84 kg/m                 Constitutional: Pt appears in NAD               HENT: Head: NCAT.                Right Ear: External ear normal.                 Left Ear: External ear normal.                Eyes: . Pupils are equal, round, and reactive to light. Conjunctivae and EOM are normal               Nose: without d/c or deformity               Neck: Neck supple. Gross normal ROM               Cardiovascular: Normal rate and regular rhythm.                 Pulmonary/Chest: Effort normal and breath sounds without rales or wheezing.                Abd:  Soft, NT, ND, + BS, no organomegaly  Neurological: Pt is alert. At baseline orientation, motor grossly intact               Skin: Skin is warm. No rashes, no other new lesions, LE edema - none               Psychiatric: Pt behavior is normal without agitation   Micro: none  Cardiac tracings I have personally interpreted today:  none  Pertinent Radiological findings (summarize): none   Lab Results  Component Value Date   WBC 11.7 (H) 02/21/2021   HGB 10.7 (L) 02/21/2021   HCT 34.1 (L) 02/21/2021   PLT 346 02/21/2021   GLUCOSE 99 02/21/2021   CHOL 160 05/16/2020   TRIG 153 (H)  05/16/2020   HDL 61 05/16/2020   LDLDIRECT 102.4 05/24/2008   LDLCALC 73 05/16/2020   ALT 8 02/20/2021   AST 14 (L) 02/20/2021   NA 137 02/21/2021   K 4.1 02/21/2021   CL 98 02/21/2021   CREATININE 0.92 02/21/2021   BUN 12 02/21/2021   CO2 28 02/21/2021   TSH 0.175 (L) 02/10/2021   INR 1.0 02/09/2021   HGBA1C 5.2 10/01/2018   Assessment/Plan:  Denise King is a 70 y.o. White or Caucasian [1] female with  has a past medical history of Acute cystitis, Acute respiratory failure ([place]), Allergy, Anemia, Anxiety, Arthritis, Back pain, Blood transfusion, CAP (community acquired pneumonia), CHF (congestive heart failure) ([place]), Chronic pain syndrome, Common bile duct stone, Depression, Diverticulosis of colon, Duodenal ulcer hemorrhagic-after biliary sphincterotomy, Dysthymia, Esophagitis, EtOH dependence ([place]), Gastritis, GERD (gastroesophageal reflux disease), H/O chest pain (Dec. 2013), colonic polyps, Hypertension, Irritable bowel syndrome, Lymphocytic colitis (02/16/2020), Osteopenia, Osteoporosis, Pneumonia (2019), Polypharmacy, Reflux esophagitis, Right sided sciatica (12/22/2017), Tobacco use disorder, Unspecified chronic bronchitis ([place]), Vertigo, Vitamin B12 deficiency, and Wears glasses.  Acute cystitis Recently improved s/p tx for e coli, now with mild possible recurrent symptoms, for urine studies, consider antibx pending results  Hyperglycemia Lab Results  Component Value Date   HGBA1C 5.2 10/01/2018   Stable, pt to continue current medical treatment  - diet, wt control   Cigarette smoker Counseled to quit  Followup: Return if symptoms worsen or fail to improve.  Cathlean Cower, MD 03/12/2021 9:55 PM [facility]

## 2021-03-12 NOTE — Patient Instructions (Signed)

## 2021-03-12 NOTE — Assessment & Plan Note (Signed)
Counseled to quit 

## 2021-03-12 NOTE — Assessment & Plan Note (Signed)
Recently improved s/p tx for e coli, now with mild possible recurrent symptoms, for urine studies, consider antibx pending results

## 2021-03-12 NOTE — Assessment & Plan Note (Signed)
Lab Results  Component Value Date   HGBA1C 5.2 10/01/2018   Stable, pt to continue current medical treatment  - diet, wt control

## 2021-03-13 ENCOUNTER — Encounter: Payer: Self-pay | Admitting: Internal Medicine

## 2021-03-13 DIAGNOSIS — R69 Illness, unspecified: Secondary | ICD-10-CM | POA: Diagnosis not present

## 2021-03-13 DIAGNOSIS — M25561 Pain in right knee: Secondary | ICD-10-CM | POA: Diagnosis not present

## 2021-03-13 DIAGNOSIS — Z79899 Other long term (current) drug therapy: Secondary | ICD-10-CM | POA: Diagnosis not present

## 2021-03-13 DIAGNOSIS — F17200 Nicotine dependence, unspecified, uncomplicated: Secondary | ICD-10-CM | POA: Diagnosis not present

## 2021-03-13 DIAGNOSIS — F17210 Nicotine dependence, cigarettes, uncomplicated: Secondary | ICD-10-CM | POA: Diagnosis not present

## 2021-03-13 DIAGNOSIS — I700 Atherosclerosis of aorta: Secondary | ICD-10-CM | POA: Insufficient documentation

## 2021-03-13 DIAGNOSIS — M545 Low back pain, unspecified: Secondary | ICD-10-CM | POA: Diagnosis not present

## 2021-03-13 LAB — URINE CULTURE

## 2021-03-13 LAB — BASIC METABOLIC PANEL
BUN: 12 mg/dL (ref 6–23)
CO2: 29 mEq/L (ref 19–32)
Calcium: 9.3 mg/dL (ref 8.4–10.5)
Chloride: 102 mEq/L (ref 96–112)
Creatinine, Ser: 0.72 mg/dL (ref 0.40–1.20)
GFR: 86.28 mL/min (ref >60.00)
Glucose, Bld: 87 mg/dL (ref 70–99)
Potassium: 4 mEq/L (ref 3.5–5.1)
Sodium: 140 mEq/L (ref 135–145)

## 2021-03-13 LAB — URINALYSIS, ROUTINE W REFLEX MICROSCOPIC
Bilirubin Urine: NEGATIVE
Hgb urine dipstick: NEGATIVE
Ketones, ur: NEGATIVE
Leukocytes,Ua: NEGATIVE
Nitrite: NEGATIVE
RBC / HPF: NONE SEEN (ref 0–?)
Specific Gravity, Urine: 1.025 (ref 1.000–1.030)
Total Protein, Urine: NEGATIVE
Urine Glucose: NEGATIVE
Urobilinogen, UA: 0.2 (ref 0.0–1.0)
pH: 6 (ref 5.0–8.0)

## 2021-03-13 LAB — HEPATIC FUNCTION PANEL
ALT: 11 U/L (ref 0–35)
AST: 21 U/L (ref 0–37)
Albumin: 4.1 g/dL (ref 3.5–5.2)
Alkaline Phosphatase: 100 U/L (ref 39–117)
Bilirubin, Direct: 0.1 mg/dL (ref 0.0–0.3)
Total Bilirubin: 0.5 mg/dL (ref 0.2–1.2)
Total Protein: 7.5 g/dL (ref 6.0–8.3)

## 2021-03-13 LAB — CBC WITH DIFFERENTIAL/PLATELET
Basophils Absolute: 0.1 10*3/uL (ref 0.0–0.1)
Basophils Relative: 1 % (ref 0.0–3.0)
Eosinophils Absolute: 0.1 10*3/uL (ref 0.0–0.7)
Eosinophils Relative: 1.7 % (ref 0.0–5.0)
HCT: 37.7 % (ref 36.0–46.0)
Hemoglobin: 12.6 g/dL (ref 12.0–15.0)
Lymphocytes Relative: 41.3 % (ref 12.0–46.0)
Lymphs Abs: 3.7 10*3/uL (ref 0.7–4.0)
MCHC: 33.4 g/dL (ref 30.0–36.0)
MCV: 91 fl (ref 78.0–100.0)
Monocytes Absolute: 0.7 10*3/uL (ref 0.1–1.0)
Monocytes Relative: 7.9 % (ref 3.0–12.0)
Neutro Abs: 4.3 10*3/uL (ref 1.4–7.7)
Neutrophils Relative %: 48.1 % (ref 43.0–77.0)
Platelets: 312 10*3/uL (ref 150.0–400.0)
RBC: 4.14 Mil/uL (ref 3.87–5.11)
RDW: 12.8 % (ref 11.5–15.5)
WBC: 8.9 10*3/uL (ref 4.0–10.5)

## 2021-03-18 NOTE — Telephone Encounter (Signed)
Encounter not needed

## 2021-03-20 ENCOUNTER — Other Ambulatory Visit (HOSPITAL_COMMUNITY): Payer: Self-pay | Admitting: Psychiatry

## 2021-03-20 ENCOUNTER — Other Ambulatory Visit: Payer: Self-pay | Admitting: Internal Medicine

## 2021-03-28 DIAGNOSIS — M25561 Pain in right knee: Secondary | ICD-10-CM | POA: Diagnosis not present

## 2021-03-28 DIAGNOSIS — G8929 Other chronic pain: Secondary | ICD-10-CM | POA: Diagnosis not present

## 2021-03-28 DIAGNOSIS — Z79899 Other long term (current) drug therapy: Secondary | ICD-10-CM | POA: Diagnosis not present

## 2021-03-28 DIAGNOSIS — F17210 Nicotine dependence, cigarettes, uncomplicated: Secondary | ICD-10-CM | POA: Diagnosis not present

## 2021-03-28 DIAGNOSIS — F17200 Nicotine dependence, unspecified, uncomplicated: Secondary | ICD-10-CM | POA: Diagnosis not present

## 2021-03-28 DIAGNOSIS — R69 Illness, unspecified: Secondary | ICD-10-CM | POA: Diagnosis not present

## 2021-03-28 DIAGNOSIS — M545 Low back pain, unspecified: Secondary | ICD-10-CM | POA: Diagnosis not present

## 2021-04-01 DIAGNOSIS — M810 Age-related osteoporosis without current pathological fracture: Secondary | ICD-10-CM | POA: Diagnosis not present

## 2021-04-02 ENCOUNTER — Telehealth: Payer: Self-pay | Admitting: Internal Medicine

## 2021-04-02 NOTE — Telephone Encounter (Signed)
Patient reports 5-6 episodes of diarrhea a day.  She has been taking budesoide a few days at a time. She has been on 9 mg for the last week.  She understood that she can take is a few days then stop. She has not been taking the lomotil.  She reports cramping and pain mostly after a meal. We reviewed she has hyoscyamine for this pain. Budesonide has not improved her symptoms.  No new meds, sick contacts etc.

## 2021-04-03 DIAGNOSIS — I509 Heart failure, unspecified: Secondary | ICD-10-CM | POA: Diagnosis not present

## 2021-04-03 NOTE — Telephone Encounter (Signed)
Patient notified Follow up arranged for May

## 2021-04-03 NOTE — Telephone Encounter (Signed)
Take budesonide 9 mg daily  See me next available May ok Use Lomitil prn  I think she misunderstood using budesonide few days at a time - in some we cut dose but always daily

## 2021-04-08 NOTE — Telephone Encounter (Signed)
Patient calling back stating medication is not helping and she is still experiencing diarrhea with abdominal pain.  Please advise.

## 2021-04-08 NOTE — Telephone Encounter (Signed)
Patient will come in tomorrow for appointment at 3:10

## 2021-04-09 ENCOUNTER — Ambulatory Visit (INDEPENDENT_AMBULATORY_CARE_PROVIDER_SITE_OTHER): Payer: Medicare HMO | Admitting: Internal Medicine

## 2021-04-09 ENCOUNTER — Encounter: Payer: Self-pay | Admitting: Internal Medicine

## 2021-04-09 VITALS — BP 130/70 | HR 88 | Ht 63.0 in | Wt 112.0 lb

## 2021-04-09 DIAGNOSIS — R1013 Epigastric pain: Secondary | ICD-10-CM | POA: Diagnosis not present

## 2021-04-09 DIAGNOSIS — K580 Irritable bowel syndrome with diarrhea: Secondary | ICD-10-CM

## 2021-04-09 DIAGNOSIS — I509 Heart failure, unspecified: Secondary | ICD-10-CM | POA: Diagnosis not present

## 2021-04-09 DIAGNOSIS — K52832 Lymphocytic colitis: Secondary | ICD-10-CM

## 2021-04-09 MED ORDER — ONDANSETRON 4 MG PO TBDP: ORAL_TABLET | ORAL | 3 refills | Status: DC

## 2021-04-09 MED ORDER — HYOSCYAMINE SULFATE 0.125 MG PO TBDP: 0.1250 mg | ORAL_TABLET | Freq: Four times a day (QID) | ORAL | 3 refills | Status: AC | PRN

## 2021-04-09 NOTE — Progress Notes (Signed)
Denise King 70 y.o. Feb 25, 1951 [id number]  Assessment & Plan:   Encounter Diagnoses  Name Primary?  . Lymphocytic colitis Yes  . Irritable bowel syndrome with diarrhea   . Abdominal pain, epigastric     The plan is to continue daily budesonide in her and titrate to the lowest effective dose at 3 mg daily if possible.  It is currently costing her about $100 for 90 days worth or 90 capsules.  She is looking into getting something a bit cheaper.  Does not seem like she can do episodic care though we could revisit that in the future.  Admittedly she is on some culprit medications including PPI and SSRI but her medication regimen is complicated and I do not think stopping those is appropriate.  Perhaps the epigastric pain and tenderness is transverse colon.  She is on twice daily PPI, we will observe this if it fails to resolve or worsen she is to contact me in the interim.  Otherwise routine follow-up in July.  07/15/2021   I appreciate the opportunity to care for this patient. CC: Biagio Borg, MD     Subjective:   Chief Complaint: Lymphocytic colitis  HPI Denise King is a 69 year old woman with lymphocytic colitis and well as [name] history who had communicated to the office she was having a flare of diarrhea.  She was using her budesonide as needed intermittently and previously had recommended she titrate down to the lowest effective daily dose.  I had her restart 9 mg daily in the last couple of weeks and she is much better though still having some watery stools but no bowel movement today.  She is asking for refills on hyoscyamine and ondansetron.  She is significantly improved from when she contacted [place] the other week. Allergies  Allergen Reactions  . Atorvastatin Nausea And Vomiting  . Levofloxacin Other (See Comments)    Reaction: achilles tendon pain  . Omnicef [Cefdinir] Diarrhea    Uncontrollable diarrhea  . Trazodone And Nefazodone Other (See Comments)    "Felt  like I was going to faint"  . Sulfa Antibiotics Rash  . Sulfonamide Derivatives Rash   Current Meds  Medication Sig  . budesonide (ENTOCORT EC) 3 MG 24 hr capsule TAKE 3 CAPSULES ONCE DAILY.  Marland Kitchen buPROPion (WELLBUTRIN SR) 150 MG 12 hr tablet Take 1 tablet (150 mg total) by mouth daily.  Marland Kitchen denosumab (PROLIA) 60 MG/ML SOSY injection Inject 60 mg into the skin every 6 (six) months.  . diclofenac Sodium (VOLTAREN) 1 % GEL Apply 2 g topically 4 (four) times daily as needed (for joint pain).  . methocarbamol (ROBAXIN) 500 MG tablet Take 1 tablet (500 mg total) by mouth every 6 (six) hours as needed for muscle spasms.  Marland Kitchen morphine (MS CONTIN) 15 MG 12 hr tablet Take 1 tablet (15 mg total) by mouth 3 (three) times daily.  . nicotine (NICODERM CQ - DOSED IN MG/24 HOURS) 21 mg/24hr patch Place 1 patch (21 mg total) onto the skin daily.  . nitroGLYCERIN (NITROSTAT) 0.4 MG SL tablet Place 1 tablet (0.4 mg total) under the tongue every 5 (five) minutes as needed for chest pain.  . Oxycodone HCl 10 MG TABS Take 1 tablet (10 mg total) by mouth 4 (four) times daily as needed.  . pantoprazole (PROTONIX) 40 MG tablet TAKE (1) TABLET TWICE A DAY BEFORE MEALS. (Patient taking differently: Take 40 mg by mouth 2 (two) times daily as needed (acid reflux).)  . rosuvastatin (CRESTOR)  5 MG tablet Take 1 tablet (5 mg total) by mouth 3 (three) times a week.  . sertraline (ZOLOFT) 100 MG tablet Take 2 tablets (200 mg total) by mouth at bedtime.  Marland Kitchen spironolactone (ALDACTONE) 25 MG tablet Take 0.5 tablets (12.5 mg total) by mouth daily. (Patient taking differently: Take 12.5 mg by mouth daily as needed (swelling).)  . valACYclovir (VALTREX) 500 MG tablet Take 500 mg by mouth 2 (two) times daily as needed (flare ups).   . [DISCONTINUED] hyoscyamine (ANASPAZ) 0.125 MG TBDP disintergrating tablet Place 0.125 mg under the tongue every 6 (six) hours as needed ([name] cramps).   . [DISCONTINUED] ondansetron (ZOFRAN-ODT) 4 MG  disintegrating tablet DISSOLVE 1 TABLET ON TONGUE EVERY 8 HOURS AS NEEDED FOR NAUSEA/VOMITING. (Patient taking differently: Take 4 mg by mouth every 8 (eight) hours as needed for vomiting or nausea.)   Past Medical History:  Diagnosis Date  . Acute cystitis   . Acute respiratory failure ([place])   . Allergy    as a child grew out of them  . Anemia    pernicious anemia  . Anxiety   . Arthritis   . Back pain   . Blood transfusion   . CAP (community acquired pneumonia)   . CHF (congestive heart failure) ([place])   . Chronic pain syndrome   . Common bile duct stone   . Depression   . Diverticulosis of colon   . Duodenal ulcer hemorrhagic-after biliary sphincterotomy   . Dysthymia   . Esophagitis   . EtOH dependence ([place])   . Gastritis   . GERD (gastroesophageal reflux disease)   . H/O chest pain Dec. 2013   no work up done  . Hx of colonic polyps   . Hypertension   . Irritable bowel syndrome   . Lymphocytic colitis 02/16/2020  . Osteopenia   . Osteoporosis   . Pneumonia 2019  . Polypharmacy   . Reflux esophagitis   . Right sided sciatica 12/22/2017  . Tobacco use disorder   . Unspecified chronic bronchitis ([place])   . Vertigo   . Vitamin B12 deficiency   . Wears glasses    Past Surgical History:  Procedure Laterality Date  . APPENDECTOMY    . BACK SURGERY  10-09   Dr. Patrice Paradise  . BIOPSY  02/14/2020   Procedure: BIOPSY;  Surgeon: Gatha Mayer, MD;  Location: [facility];  Service: Endoscopy;;  . CARPAL TUNNEL RELEASE Right 08/14/2014   Procedure: RIGHT CARPAL TUNNEL RELEASE AND INJECT LEFT THUMB;  Surgeon: Daryll Brod, MD;  Location: [place];  Service: Orthopedics;  Laterality: Right;  . CHOLECYSTECTOMY N/A 02/22/2020   Procedure: LAPAROSCOPIC CHOLECYSTECTOMY WITH INTRAOPERATIVE CHOLANGIOGRAM;  Surgeon: Kieth Brightly Arta Bruce, MD;  [facility];  Service: General;  Laterality: N/A;  . COLONOSCOPY    . COLONOSCOPY WITH PROPOFOL N/A 02/14/2020   Procedure:  COLONOSCOPY WITH PROPOFOL;  Surgeon: Gatha Mayer, MD;  [facility];  Service: Endoscopy;  Laterality: N/A;  . ENDOSCOPIC RETROGRADE CHOLANGIOPANCREATOGRAPHY (ERCP) WITH PROPOFOL N/A 12/25/2019   Procedure: ENDOSCOPIC RETROGRADE CHOLANGIOPANCREATOGRAPHY (ERCP) WITH PROPOFOL;  Surgeon: Gatha Mayer, MD;  Location: [place];  Service: Gastroenterology;  Laterality: N/A;  . ESOPHAGOGASTRODUODENOSCOPY (EGD) WITH PROPOFOL N/A 01/06/2020   Procedure: ESOPHAGOGASTRODUODENOSCOPY (EGD) WITH PROPOFOL;  Surgeon: Irene Shipper, MD;  Location: [facility] ENDOSCOPY;  Service: Endoscopy;  Laterality: N/A;  . HOT HEMOSTASIS N/A 01/06/2020   Procedure: HOT HEMOSTASIS (ARGON PLASMA COAGULATION/BICAP);  Surgeon: Irene Shipper, MD;  Location:  [place] ENDOSCOPY;  Service: Endoscopy;  Laterality: N/A;  . INTRAMEDULLARY (IM) NAIL INTERTROCHANTERIC Right 10/01/2018   Procedure: INTRAMEDULLARY (IM) NAIL INTERTROCHANTRIC;  Surgeon: Thornton Park, MD;  Location: [facility]S;  Service: Orthopedics;  Laterality: Right;  . LAPAROSCOPY N/A 02/21/2013   Procedure: LAPAROSCOPY OPERATIVE;  Surgeon: Margarette Asal, MD;  Location: [facility];  Service: Gynecology;  Laterality: N/A;  REQUESTING 5MM SCOPE WITH CAMERA  . LEFT HEART CATH AND CORONARY ANGIOGRAPHY N/A 05/11/2019   Procedure: LEFT HEART CATH AND CORONARY ANGIOGRAPHY;  Surgeon: Sherren Mocha, MD;  Location: [place] CV LAB;  Service: Cardiovascular;  Laterality: N/A;  . REMOVAL OF STONES  12/25/2019   Procedure: Karlyn Agee;  Surgeon: Gatha Mayer, MD;  Location: [facility] ENDOSCOPY;  Service: Gastroenterology;;  . Clide Deutscher  01/06/2020   Procedure: Clide Deutscher;  Surgeon: Irene Shipper, MD;  Location: [facility] ENDOSCOPY;  Service: Endoscopy;;  . Joan Mayans  12/25/2019   Procedure: SPHINCTEROTOMY;  Surgeon: Gatha Mayer, MD;  Location: [place];  Service: Gastroenterology;;  . TONSILLECTOMY    . TUBAL LIGATION    . UPPER GASTROINTESTINAL ENDOSCOPY     Social  History   Social History Narrative   Married x 38 yrs. 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 Currently resides in a house with her husband. 3 cats.  Fun: used to like to do a lot of things.    Denies religious beliefs that would effect health care.    lives with husband [name]; quit smoking- October 6th, 2019. ocasional alcohol. [employer] [2009] on disability sec to back issues.    family history includes Alcohol abuse in her daughter; Bipolar disorder in her daughter; Colon cancer in her brother and father; Drug abuse in her daughter; Heart disease in her mother; Prostate cancer in her father; Skin cancer in her sister.   Review of Systems As above  Objective:   Physical Exam BP 130/70   Pulse 88   Ht 5\' 3"  (1.6 m)   Wt 112 lb (50.8 kg)   SpO2 93%   BMI 19.84 kg/m  Thin chronically ill NAD abd sl tender epigastrium, scaphoid BS+

## 2021-04-09 NOTE — Patient Instructions (Signed)
Please stay on the budesonide at 9 mg (3) a day for 1 month then when you are in a good place with diarrhea try reducing to 2 pills a day for a month and then can try to get to 1 pill a day but not lower.  If the abdominal pain worsens or fails to resolve in next couple of weeks call back.  Otherwise see you in July.  I appreciate the opportunity to care for you. Gatha Mayer, MD, [facility]

## 2021-04-10 ENCOUNTER — Telehealth: Payer: Self-pay

## 2021-04-10 ENCOUNTER — Other Ambulatory Visit: Payer: Self-pay

## 2021-04-10 NOTE — Telephone Encounter (Signed)
I have started a prior authorization thru [facility] for [place]. Will await the outcome.

## 2021-04-14 DIAGNOSIS — F17210 Nicotine dependence, cigarettes, uncomplicated: Secondary | ICD-10-CM | POA: Diagnosis not present

## 2021-04-14 DIAGNOSIS — M545 Low back pain, unspecified: Secondary | ICD-10-CM | POA: Diagnosis not present

## 2021-04-14 DIAGNOSIS — R69 Illness, unspecified: Secondary | ICD-10-CM | POA: Diagnosis not present

## 2021-04-14 DIAGNOSIS — F17200 Nicotine dependence, unspecified, uncomplicated: Secondary | ICD-10-CM | POA: Diagnosis not present

## 2021-04-14 DIAGNOSIS — Z79899 Other long term (current) drug therapy: Secondary | ICD-10-CM | POA: Diagnosis not present

## 2021-04-14 DIAGNOSIS — M25561 Pain in right knee: Secondary | ICD-10-CM | POA: Diagnosis not present

## 2021-04-14 DIAGNOSIS — G8929 Other chronic pain: Secondary | ICD-10-CM | POA: Diagnosis not present

## 2021-04-15 NOTE — Telephone Encounter (Signed)
Per Cover my meds prescription for hyoscyamine has been denied. Called and informed her that PA for medication was denied. Please advise Dr. Carlean Purl.

## 2021-04-15 NOTE — Telephone Encounter (Signed)
Informed patient of Dr. Celesta Aver recommendations and patient states they have a Good rx card already on file at her pharmacy so she will use that.

## 2021-04-15 NOTE — Telephone Encounter (Signed)
I suggest she try cash and Good Rx

## 2021-04-23 ENCOUNTER — Telehealth (INDEPENDENT_AMBULATORY_CARE_PROVIDER_SITE_OTHER): Payer: Medicare HMO | Admitting: Psychiatry

## 2021-04-23 ENCOUNTER — Encounter (HOSPITAL_COMMUNITY): Payer: Self-pay | Admitting: Psychiatry

## 2021-04-23 ENCOUNTER — Other Ambulatory Visit: Payer: Self-pay

## 2021-04-23 DIAGNOSIS — F411 Generalized anxiety disorder: Secondary | ICD-10-CM

## 2021-04-23 DIAGNOSIS — F1020 Alcohol dependence, uncomplicated: Secondary | ICD-10-CM

## 2021-04-23 DIAGNOSIS — R69 Illness, unspecified: Secondary | ICD-10-CM | POA: Diagnosis not present

## 2021-04-23 MED ORDER — SERTRALINE HCL 100 MG PO TABS: 200.0000 mg | ORAL_TABLET | Freq: Every day | ORAL | 1 refills | Status: AC

## 2021-04-23 MED ORDER — BUPROPION HCL ER (SR) 150 MG PO TB12: 150.0000 mg | ORAL_TABLET | Freq: Every day | ORAL | 1 refills | Status: AC

## 2021-04-23 NOTE — Progress Notes (Signed)
BH MD/PA/NP OP Progress Note  04/23/2021 3:09 PM Denise King  MRN:  [id number] Interview was conducted by phone and I verified that I was speaking with the correct person using two identifiers. I discussed the limitations of evaluation and management by telemedicine and  the availability of in person appointments. Patient expressed understanding and agreed to proceed. Participants in the visit: patient (location - home); physician (location - home office).  Chief Complaint:  "not feeling good"  HPI: 70 yo female with chronic anxiety/depression who was seen previously by Dr.pucilowska who is no longer with [facility]. She reports she ran out of her medications 1 week ago and not feeling good. She has crohn's disease which is not under control and she has been losing weight. Stressed about her health.  Has not been sleeping ok. Denies drinking alcohol. Denies suicidal thoughts.  Per Dr.Pucilowska, last note from his visit with this patient, She was taken off clonazepam out of concern about combining benzodiazepines with narcotic analgesics and instead buspirone was added. She did not find it very effective. We have added a low dose of aripiprazole to augment sertraline and [name] reported that her mood started to improve. While in the hospital in December she was taken off hydroxyzine - she takes melatonin now and her sleep is adequate. She was restarted on alprazolam low dose as needed for anxiety in March (by GI) but her pain specialist has been concerned about likely interaction with opioid analgesics she is on. [name] reports falling in early July, broke sternum (although chart indicated that it was a result of CPR) and several of her medications were stopped at that time (including alprazolam, buspirone, aripiprazole and bupropion). She remains on oxycodone and MS Contin for chronic pain. Her mood which had been fairly stable lately worsened again due to family circumstances: daughter with  bipolar disorder and substance abuse issues in conflict with Brooks'es husband (yelling, arguments) and she is being in a position of  "peacemaker". She feels at times like giving up and walking away from "all that".    Visit Diagnosis:    ICD-10-CM   1. Generalized anxiety disorder  F41.1   2. Uncomplicated alcohol dependence ([place])  F10.20     Past Psychiatric History: Please see intake H&P.  Past Medical History:  Past Medical History:  Diagnosis Date   Acute cystitis    Acute respiratory failure ([place])    Allergy    as a child grew out of them   Anemia    pernicious anemia   Anxiety    Arthritis    Back pain    Blood transfusion    CAP (community acquired pneumonia)    CHF (congestive heart failure) (HCC)    Chronic pain syndrome    Common bile duct stone    Depression    Diverticulosis of colon    Duodenal ulcer hemorrhagic-after biliary sphincterotomy    Dysthymia    Esophagitis    EtOH dependence ([place])    Gastritis    GERD (gastroesophageal reflux disease)    H/O chest pain Dec. 2013   no work up done   Hx of colonic polyps    Hypertension    Irritable bowel syndrome    Lymphocytic colitis 02/16/2020   Osteopenia    Osteoporosis    Pneumonia 2019   Polypharmacy    Reflux esophagitis    Right sided sciatica 12/22/2017   Tobacco use disorder    Unspecified chronic bronchitis ([place])  Vertigo    Vitamin B12 deficiency    Wears glasses     Past Surgical History:  Procedure Laterality Date   APPENDECTOMY     BACK SURGERY  10-09   Dr. Patrice Paradise   BIOPSY  02/14/2020   Procedure: BIOPSY;  Surgeon: Gatha Mayer, MD;  Location: [facility];  Service: Endoscopy;;   CARPAL TUNNEL RELEASE Right 08/14/2014   Procedure: RIGHT CARPAL TUNNEL RELEASE AND INJECT LEFT THUMB;  Surgeon: Daryll Brod, MD;  Location: [place];  Service: Orthopedics;  Laterality: Right;   CHOLECYSTECTOMY N/A 02/22/2020   Procedure: LAPAROSCOPIC CHOLECYSTECTOMY WITH  INTRAOPERATIVE CHOLANGIOGRAM;  Surgeon: Kieth Brightly Arta Bruce, MD;  Location: [facility];  Service: General;  Laterality: N/A;   COLONOSCOPY     COLONOSCOPY WITH PROPOFOL N/A 02/14/2020   Procedure: COLONOSCOPY WITH PROPOFOL;  Surgeon: Gatha Mayer, MD;  Location: [facility];  Service: Endoscopy;  Laterality: N/A;   ENDOSCOPIC RETROGRADE CHOLANGIOPANCREATOGRAPHY (ERCP) WITH PROPOFOL N/A 12/25/2019   Procedure: ENDOSCOPIC RETROGRADE CHOLANGIOPANCREATOGRAPHY (ERCP) WITH PROPOFOL;  Surgeon: Gatha Mayer, MD;  Location: [facility];  Service: Gastroenterology;  Laterality: N/A;   ESOPHAGOGASTRODUODENOSCOPY (EGD) WITH PROPOFOL N/A 01/06/2020   Procedure: ESOPHAGOGASTRODUODENOSCOPY (EGD) WITH PROPOFOL;  Surgeon: Irene Shipper, MD;  Location[facility]COPY;  Service: Endoscopy;  Laterality: N/A;   HOT HEMOSTASIS N/A 01/06/2020   Procedure: HOT HEMOSTASIS (ARGON PLASMA COAGULATION/BICAP);  Surgeon: Irene Shipper, MD;  [facility];  Service: Endoscopy;  Laterality: N/A;   INTRAMEDULLARY (IM) NAIL INTERTROCHANTERIC Right 10/01/2018   Procedure: INTRAMEDULLARY (IM) NAIL INTERTROCHANTRIC;  Surgeon: Thornton Park, MD;  Location: [facility] ORS;  Service: Orthopedics;  Laterality: Right;   LAPAROSCOPY N/A 02/21/2013   Procedure: LAPAROSCOPY OPERATIVE;  Surgeon: Margarette Asal, MD;  Location: [facility];  Service: Gynecology;  Laterality: N/A;  REQUESTING 5MM SCOPE WITH CAMERA   LEFT HEART CATH AND CORONARY ANGIOGRAPHY N/A 05/11/2019   Procedure: LEFT HEART CATH AND CORONARY ANGIOGRAPHY;  Surgeon: Sherren Mocha, MD;  Location: [place] CV LAB;  Service: Cardiovascular;  Laterality: N/A;   REMOVAL OF STONES  12/25/2019   Procedure: Karlyn Agee;  Surgeon: Gatha Mayer, MD;  Location: [facility] ENDOSCOPY;  Service: Gastroenterology;;   Clide Deutscher  01/06/2020   Procedure: Clide Deutscher;  Surgeon: Irene Shipper, MD;  Location: [facility] ENDOSCOPY;  Service: Endoscopy;;   SPHINCTEROTOMY  12/25/2019   Procedure:  Joan Mayans;  Surgeon: Gatha Mayer, MD;  Location: [place];  Service: Gastroenterology;;   TONSILLECTOMY     TUBAL LIGATION     UPPER GASTROINTESTINAL ENDOSCOPY      Family Psychiatric History: Reviewed.  Family History:  Family History  Problem Relation Age of Onset   Colon cancer Father    Prostate cancer Father    Heart disease Mother        prev MVR, also has DJD   Colon cancer Brother    Skin cancer Sister    Alcohol abuse Daughter    Bipolar disorder Daughter    Drug abuse Daughter    Esophageal cancer Neg Hx    Rectal cancer Neg Hx    Stomach cancer Neg Hx     Social History:  Social History   Socioeconomic History   Marital status: Married    Spouse name: Not on file   Number of children: 4   Years of education: 16   Highest education level: Not on file  Occupational History   Occupation: retired    Comment: disablility  Tobacco Use  Smoking status: Current Every Day Smoker    Packs/day: 1.00    Years: 30.00    Pack years: 30.00    Types: Cigarettes   Smokeless tobacco: Never Used  Vaping Use   Vaping Use: Never used  Substance and Sexual Activity   Alcohol use: Yes    Alcohol/week: 3.0 standard drinks    Types: 3 Standard drinks or equivalent per week    Comment: occasional   Drug use: No   Sexual activity: Yes  Other Topics Concern   Not on file  Social History Narrative   Married x 38 yrs. Has BS degree in Horticulture from [employer]. Currently resides in a house with her husband. 3 cats.  Fun: used to like to do a lot of things.    Denies religious beliefs that would effect health care.    lives with husband [name]; quit smoking- October 6th, 2019. ocasional alcohol. [employer] [2009] on disability sec to back issues.    Social Determinants of Health   Financial Resource Strain: Not on file  Food Insecurity: Not on file  Transportation Needs: No Transportation Needs   Lack of Transportation (Medical): No   Lack of  Transportation (Non-Medical): No  Physical Activity: Not on file  Stress: Not on file  Social Connections: Moderately Isolated   Frequency of Communication with Friends and Family: More than three times a week   Frequency of Social Gatherings with Friends and Family: Once a week   Attends Religious Services: Never   Marine scientist or Organizations: No   Attends Archivist Meetings: Never   Marital Status: Married    Allergies:  Allergies  Allergen Reactions   Atorvastatin Nausea And Vomiting   Levofloxacin Other (See Comments)    Reaction: achilles tendon pain   Omnicef [Cefdinir] Diarrhea    Uncontrollable diarrhea   Trazodone And Nefazodone Other (See Comments)    "Felt like I was going to faint"   Sulfa Antibiotics Rash   Sulfonamide Derivatives Rash    Metabolic Disorder Labs: Lab Results  Component Value Date   HGBA1C 5.2 10/01/2018   MPG 103 10/01/2018   No results found for: PROLACTIN Lab Results  Component Value Date   CHOL 160 05/16/2020   TRIG 153 (H) 05/16/2020   HDL 61 05/16/2020   CHOLHDL 2.6 05/16/2020   VLDL 10 04/09/2019   LDLCALC 73 05/16/2020   LDLCALC 47 12/04/2019   Lab Results  Component Value Date   TSH 0.175 (L) 02/10/2021   TSH 0.499 04/08/2019    Therapeutic Level Labs: No results found for: LITHIUM No results found for: VALPROATE No components found for:  CBMZ  Current Medications: Current Outpatient Medications  Medication Sig Dispense Refill   budesonide (ENTOCORT EC) 3 MG 24 hr capsule TAKE 3 CAPSULES ONCE DAILY. 90 capsule 1   buPROPion (WELLBUTRIN SR) 150 MG 12 hr tablet Take 1 tablet (150 mg total) by mouth daily. 90 tablet 1   denosumab (PROLIA) 60 MG/ML SOSY injection Inject 60 mg into the skin every 6 (six) months.     diclofenac Sodium (VOLTAREN) 1 % GEL Apply 2 g topically 4 (four) times daily as needed (for joint pain).     hyoscyamine (ANASPAZ) 0.125 MG TBDP disintergrating tablet Place 1 tablet  (0.125 mg total) under the tongue every 6 (six) hours as needed ([name] cramps). 90 tablet 3   methocarbamol (ROBAXIN) 500 MG tablet Take 1 tablet (500 mg total) by mouth every 6 (six)  hours as needed for muscle spasms.     morphine (MS CONTIN) 15 MG 12 hr tablet Take 1 tablet (15 mg total) by mouth 3 (three) times daily. 30 tablet 0   nicotine (NICODERM CQ - DOSED IN MG/24 HOURS) 21 mg/24hr patch Place 1 patch (21 mg total) onto the skin daily. 28 patch 0   nitroGLYCERIN (NITROSTAT) 0.4 MG SL tablet Place 1 tablet (0.4 mg total) under the tongue every 5 (five) minutes as needed for chest pain. 25 tablet 3   ondansetron (ZOFRAN-ODT) 4 MG disintegrating tablet DISSOLVE 1 TABLET ON TONGUE EVERY 8 HOURS AS NEEDED FOR NAUSEA/VOMITING. 30 tablet 3   Oxycodone HCl 10 MG TABS Take 1 tablet (10 mg total) by mouth 4 (four) times daily as needed. 15 tablet 0   pantoprazole (PROTONIX) 40 MG tablet TAKE (1) TABLET TWICE A DAY BEFORE MEALS. (Patient taking differently: Take 40 mg by mouth 2 (two) times daily as needed (acid reflux).) 60 tablet 11   rosuvastatin (CRESTOR) 5 MG tablet Take 1 tablet (5 mg total) by mouth 3 (three) times a week. 39 tablet 3   sertraline (ZOLOFT) 100 MG tablet Take 2 tablets (200 mg total) by mouth at bedtime. 180 tablet 1   spironolactone (ALDACTONE) 25 MG tablet Take 0.5 tablets (12.5 mg total) by mouth daily. (Patient taking differently: Take 12.5 mg by mouth daily as needed (swelling).) 45 tablet 3   valACYclovir (VALTREX) 500 MG tablet Take 500 mg by mouth 2 (two) times daily as needed (flare ups).      No current facility-administered medications for this visit.     Psychiatric Specialty Exam: Review of Systems  Constitutional: Positive for fatigue.  Musculoskeletal: Positive for back pain.  Psychiatric/Behavioral: The patient is nervous/anxious.   All other systems reviewed and are negative.   There were no vitals taken for this visit.There is no height or weight on file  to calculate BMI.  General Appearance: NA  Eye Contact:  NA  Speech:  Clear and Coherent and Normal Rate  Volume:  Normal  Mood:  Anxious and Depressed  Affect:  NA  Thought Process:  Goal Directed  Orientation:  Full (Time, Place, and Person)  Thought Content: Logical   Suicidal Thoughts:  No  Homicidal Thoughts:  No  Memory:  Immediate;   Good Recent;   Good Remote;   Good  Judgement:  Good  Insight:  Good  Psychomotor Activity:  NA  Concentration:  Concentration: Good  Recall:  Good  Fund of Knowledge: Good  Language: Good  Akathisia:  Negative  Handed:  Right  AIMS (if indicated): not done  Assets:  Communication Skills Desire for Improvement Financial Resources/Insurance Housing Resilience  ADL's:  Intact  Cognition: WNL  Sleep:  Fair   Screenings: PHQ2-9    [facility] Visit from 01/31/2021 in [place] at [facility] Visit from 06/17/2020 in [place] at [facility] Visit from 03/14/2020 in [place] at [facility] Visit from 09/11/2019 in [place] Visit from 10/25/2015 in [facility] at [facility] Total Score 4 1 1 1 3   PHQ-9 Total Score 5 11 -- -- 18      Flowsheet Row ED to Hosp-Admission (Discharged) from 02/09/2021 in [facility] ED from 01/28/2021 in [facility] No Risk No Risk        Assessment and Plan: 70  yo female with chronic anxiety/depression which worsened with increased physical problems/pain. She has been for a long time on Cymbalta - we have cross tapered it to sertraline which she tolerated well and dose has been increased to 150 mg as she continued to reports feeling anxious and depressed.  She was taken off clonazepam out of concern about combining benzodiazepines with narcotic analgesics and instead buspirone was added. She did not find it very effective. We have  added a low dose of aripiprazole to augment sertraline and [name] reported that her mood started to improve. While in the hospital in December she was taken off hydroxyzine - she takes melatonin now and her sleep is adequate. She was restarted on alprazolam low dose as needed for anxiety in March (by GI) but her pain specialist has been concerned about likely interaction with opioid analgesics she is on. [name] reports falling in early July, broke sternum (although chart indicated that it was a result of CPR) and several of her medications were stopped at that time (including alprazolam, buspirone, aripiprazole and bupropion). She remains on oxycodone and MS Contin for chronic pain. Her mood which had been fairly stable lately worsened again due to family circumstances: daughter with bipolar disorder and substance abuse issues in conflict with Brooks'es husband (yelling, arguments) and she is being in a position of  "peacemaker". She feels at times like giving up and walking away from "all that".   Dx: Dysthymic disorder; GAD; Chronic pain disorder   Plan: Continue Zoloft 200 mg at HS and Wellbutrin SR 150 mg in am. Next appointment in 3 months. The plan was discussed with patient who had an opportunity to ask questions and these were all answered. I spend 10 minutes in phone consultation with the patient.  I spent 20 minutes on chart review.   Elvin So, MD 04/23/2021, 3:09 PM

## 2021-05-03 DIAGNOSIS — I509 Heart failure, unspecified: Secondary | ICD-10-CM | POA: Diagnosis not present

## 2021-05-05 ENCOUNTER — Emergency Department (HOSPITAL_COMMUNITY): Payer: Medicare HMO

## 2021-05-05 ENCOUNTER — Telehealth: Payer: Self-pay | Admitting: Internal Medicine

## 2021-05-05 ENCOUNTER — Emergency Department (HOSPITAL_COMMUNITY)
Admission: EM | Admit: 2021-05-05 | Discharge: 2021-05-05 | Disposition: A | Discharge status: Home / Self Care | Payer: Medicare HMO | Attending: Emergency Medicine | Admitting: Emergency Medicine

## 2021-05-05 ENCOUNTER — Other Ambulatory Visit: Payer: Self-pay

## 2021-05-05 ENCOUNTER — Encounter (HOSPITAL_COMMUNITY): Payer: Self-pay | Admitting: Emergency Medicine

## 2021-05-05 DIAGNOSIS — I110 Hypertensive heart disease with heart failure: Secondary | ICD-10-CM | POA: Insufficient documentation

## 2021-05-05 DIAGNOSIS — E876 Hypokalemia: Secondary | ICD-10-CM | POA: Diagnosis not present

## 2021-05-05 DIAGNOSIS — I509 Heart failure, unspecified: Secondary | ICD-10-CM | POA: Insufficient documentation

## 2021-05-05 DIAGNOSIS — Z743 Need for continuous supervision: Secondary | ICD-10-CM | POA: Diagnosis not present

## 2021-05-05 DIAGNOSIS — J449 Chronic obstructive pulmonary disease, unspecified: Secondary | ICD-10-CM | POA: Insufficient documentation

## 2021-05-05 DIAGNOSIS — K219 Gastro-esophageal reflux disease without esophagitis: Secondary | ICD-10-CM | POA: Insufficient documentation

## 2021-05-05 DIAGNOSIS — R0989 Other specified symptoms and signs involving the circulatory and respiratory systems: Secondary | ICD-10-CM

## 2021-05-05 DIAGNOSIS — J40 Bronchitis, not specified as acute or chronic: Secondary | ICD-10-CM | POA: Diagnosis not present

## 2021-05-05 DIAGNOSIS — I10 Essential (primary) hypertension: Secondary | ICD-10-CM | POA: Diagnosis not present

## 2021-05-05 DIAGNOSIS — J9811 Atelectasis: Secondary | ICD-10-CM | POA: Diagnosis not present

## 2021-05-05 DIAGNOSIS — R0602 Shortness of breath: Secondary | ICD-10-CM | POA: Diagnosis not present

## 2021-05-05 DIAGNOSIS — F17210 Nicotine dependence, cigarettes, uncomplicated: Secondary | ICD-10-CM | POA: Insufficient documentation

## 2021-05-05 DIAGNOSIS — F458 Other somatoform disorders: Secondary | ICD-10-CM | POA: Diagnosis not present

## 2021-05-05 DIAGNOSIS — R198 Other specified symptoms and signs involving the digestive system and abdomen: Secondary | ICD-10-CM

## 2021-05-05 DIAGNOSIS — R079 Chest pain, unspecified: Secondary | ICD-10-CM | POA: Diagnosis not present

## 2021-05-05 DIAGNOSIS — R9431 Abnormal electrocardiogram [ECG] [EKG]: Secondary | ICD-10-CM | POA: Diagnosis not present

## 2021-05-05 DIAGNOSIS — R69 Illness, unspecified: Secondary | ICD-10-CM | POA: Diagnosis not present

## 2021-05-05 DIAGNOSIS — Z79899 Other long term (current) drug therapy: Secondary | ICD-10-CM | POA: Diagnosis not present

## 2021-05-05 DIAGNOSIS — R0789 Other chest pain: Secondary | ICD-10-CM | POA: Diagnosis not present

## 2021-05-05 LAB — CBC WITH DIFFERENTIAL/PLATELET
Abs Immature Granulocytes: 0.01 10*3/uL (ref 0.00–0.07)
Basophils Absolute: 0 10*3/uL (ref 0.0–0.1)
Basophils Relative: 0 %
Eosinophils Absolute: 0 10*3/uL (ref 0.0–0.5)
Eosinophils Relative: 0 %
HCT: 36.2 % (ref 36.0–46.0)
Hemoglobin: 11.7 g/dL — ABNORMAL LOW (ref 12.0–15.0)
Immature Granulocytes: 0 %
Lymphocytes Relative: 15 %
Lymphs Abs: 0.7 10*3/uL (ref 0.7–4.0)
MCH: 31 pg (ref 26.0–34.0)
MCHC: 32.3 g/dL (ref 30.0–36.0)
MCV: 96 fL (ref 80.0–100.0)
Monocytes Absolute: 0 10*3/uL — ABNORMAL LOW (ref 0.1–1.0)
Monocytes Relative: 1 %
Neutro Abs: 4.3 10*3/uL (ref 1.7–7.7)
Neutrophils Relative %: 84 %
Platelets: 253 10*3/uL (ref 150–400)
RBC: 3.77 MIL/uL — ABNORMAL LOW (ref 3.87–5.11)
RDW: 14.5 % (ref 11.5–15.5)
WBC: 5 10*3/uL (ref 4.0–10.5)
nRBC: 0 % (ref 0.0–0.2)

## 2021-05-05 LAB — COMPREHENSIVE METABOLIC PANEL
ALT: 21 U/L (ref 0–44)
AST: 28 U/L (ref 15–41)
Albumin: 3.3 g/dL — ABNORMAL LOW (ref 3.5–5.0)
Alkaline Phosphatase: 90 U/L (ref 38–126)
Anion gap: 7 (ref 5–15)
BUN: 7 mg/dL — ABNORMAL LOW (ref 8–23)
CO2: 28 mmol/L (ref 22–32)
Calcium: 8.5 mg/dL — ABNORMAL LOW (ref 8.9–10.3)
Chloride: 105 mmol/L (ref 98–111)
Creatinine, Ser: 0.75 mg/dL (ref 0.44–1.00)
GFR, Estimated: >60 mL/min (ref >60)
Glucose, Bld: 105 mg/dL — ABNORMAL HIGH (ref 70–99)
Potassium: 2.9 mmol/L — ABNORMAL LOW (ref 3.5–5.1)
Sodium: 140 mmol/L (ref 135–145)
Total Bilirubin: 0.9 mg/dL (ref 0.3–1.2)
Total Protein: 6 g/dL — ABNORMAL LOW (ref 6.5–8.1)

## 2021-05-05 LAB — TROPONIN I (HIGH SENSITIVITY): Troponin I (High Sensitivity): 17 ng/L (ref <18)

## 2021-05-05 MED ORDER — AZITHROMYCIN 250 MG PO TABS: 250.0000 mg | ORAL_TABLET | Freq: Every day | ORAL | 0 refills | Status: DC

## 2021-05-05 MED ORDER — SUCRALFATE 1 G PO TABS: 1.0000 g | ORAL_TABLET | Freq: Three times a day (TID) | ORAL | 0 refills | Status: DC

## 2021-05-05 MED ORDER — POTASSIUM CHLORIDE CRYS ER 20 MEQ PO TBCR
40.0000 meq | EXTENDED_RELEASE_TABLET | Freq: Once | ORAL | Status: AC
  Administered 2021-05-05: 40 meq via ORAL
  Filled 2021-05-05: qty 2

## 2021-05-05 MED ORDER — PREDNISONE 50 MG PO TABS: 50.0000 mg | ORAL_TABLET | Freq: Every day | ORAL | 0 refills | Status: AC

## 2021-05-05 MED ORDER — ALBUTEROL SULFATE HFA 108 (90 BASE) MCG/ACT IN AERS
2.0000 | INHALATION_SPRAY | Freq: Once | RESPIRATORY_TRACT | Status: AC
  Administered 2021-05-05: 2 via RESPIRATORY_TRACT
  Filled 2021-05-05: qty 6.7

## 2021-05-05 MED ORDER — IOHEXOL 350 MG/ML SOLN
75.0000 mL | Freq: Once | INTRAVENOUS | Status: AC | PRN
  Administered 2021-05-05: 75 mL via INTRAVENOUS

## 2021-05-05 NOTE — Telephone Encounter (Signed)
[facility]   Advised to call 911 EMS now. Patient seemed hesitant to call. Triage nurse called her back 5 mins ago and it went straight to VM. While on the phone advised her to put on her oxygen to try to help her breathing.   Please advise.

## 2021-05-05 NOTE — ED Triage Notes (Signed)
Pt arrives via EMS from home- pt wears home O2 only as needed- 2LNC. Pt feeling SOB/CP over the past week- started wearing O2 3LNC to assist with SOB at night this week. Pt has hx of CHF. Lung clear per EMS. EKG unremarkable per EMS. EMS gave 125mg  solu medrol and 324mg  ASA. BP 160/90, HR 90's.

## 2021-05-05 NOTE — ED Notes (Signed)
Xray at the bedside.

## 2021-05-05 NOTE — Telephone Encounter (Signed)
Patient calling, states she is having a hard time breathing and been really fatigued. Transferred to team health for further evaluation.

## 2021-05-05 NOTE — Telephone Encounter (Signed)
[facility] FYI...  She is having trouble breathing and has fatigue. Her chest feels she can not take a deep breath. Over a week is much worse. coughing , voice sounds hoarse.   Advised to CALL EMS 911 NOW: CARE ADVICE given per COVID-19 - DIAGNOSED OR SUSPECTED (Adult) guideline. She states" i have to concentrate to breath" She has o2 in the home. I recommended she ask her husband rf her daughter to get the o2. Other person should calll 911.   Patient went to the ED.

## 2021-05-05 NOTE — ED Provider Notes (Signed)
[facility] EMERGENCY DEPARTMENT Provider Note   CSN: [id number] Arrival date & time: 05/05/21  1129     History No chief complaint on file.   Denise King is a 70 y.o. female with PMH of tobacco use, COPD with chronic hypoxic respiratory failure on 2 L supplemental oxygen, and chronic diastolic CHF who presents to the ED via EMS for a 1 week history of chest pain and shortness of breath.  History obtained by EMS was that she was given 125 mg Solu-Medrol and 324 mg aspirin.  On my examination, patient reports that for the past week she has been having difficulty breathing.  She states that her chest does not want to expand and that she has difficulty/discomfort taking deep breath.  She states that this is unusual for her.  She continues to endorse tobacco use intermittently.  She wears 2 L supplemental oxygen at home, usually just at night.  She lives at home with her husband for whom she cares.    Patient also states that for the past few days she has been having difficulty swallowing.  She states that she has been able to drink liquids, but feels as though she is having difficulty swallowing solids.  She denies any recent illness or infection, fevers or chills, new or different cough, abdominal pain, back pain, history of clots or clotting disorder, unilateral extremity swelling or edema, orthopnea, weight gain, or other symptoms.   HPI     Past Medical History:  Diagnosis Date  . Acute cystitis   . Acute respiratory failure ([place])   . Allergy    as a child grew out of them  . Anemia    pernicious anemia  . Anxiety   . Arthritis   . Back pain   . Blood transfusion   . CAP (community acquired pneumonia)   . CHF (congestive heart failure) ([place])   . Chronic pain syndrome   . Common bile duct stone   . Depression   . Diverticulosis of colon   . Duodenal ulcer hemorrhagic-after biliary sphincterotomy   . Dysthymia   . Esophagitis   . EtOH dependence  ([place])   . Gastritis   . GERD (gastroesophageal reflux disease)   . H/O chest pain Dec. 2013   no work up done  . Hx of colonic polyps   . Hypertension   . Irritable bowel syndrome   . Lymphocytic colitis 02/16/2020  . Osteopenia   . Osteoporosis   . Pneumonia 2019  . Polypharmacy   . Reflux esophagitis   . Right sided sciatica 12/22/2017  . Tobacco use disorder   . Unspecified chronic bronchitis ([place])   . Vertigo   . Vitamin B12 deficiency   . Wears glasses     Patient Active Problem List   Diagnosis Date Noted  . Aortic atherosclerosis ([place]) 03/13/2021  . Genital herpes simplex 03/12/2021  . Hyperlipidemia 03/12/2021  . Severe sepsis ([place]) 02/10/2021  . Acute cystitis 02/10/2021  . Left hip pain 02/10/2021  . Acute metabolic encephalopathy [id number]  . Toe fracture, right 02/01/2021  . Concussion 02/01/2021  . Floaters, bilateral 01/31/2021  . Acute on chronic respiratory failure with hypoxia and hypercapnia ([place]) 07/10/2020  . Acute on chronic heart failure ([place]) 07/04/2020  . Choledocholithiasis 02/22/2020  . Lymphocytic colitis 02/16/2020  . Duodenal ulcer hemorrhagic   . Malnutrition of moderate degree 01/01/2020  . Acute respiratory failure with hypoxia and hypercapnia ([place]) 12/28/2019  . Bipolar disorder ([place])  12/28/2019  . Alcohol abuse 12/28/2019  . Anxiety 12/28/2019  . Acute on chronic heart failure with preserved ejection fraction ([place]) 12/28/2019  . Abnormal MRI of abdomen   . Cholelithiasis   . Common bile duct stone 12/23/2019  . Acute cholecystitis 12/23/2019  . Polycythemia 09/11/2019  . Right knee pain 08/09/2019  . Quadriceps contusion 08/09/2019  . Abnormal cardiac CT angiography 05/11/2019  . Chest pain 04/11/2019  . Acute viral syndrome 04/11/2019  . Tobacco abuse 04/11/2019  . Hyperphosphatemia 04/11/2019  . SOB (shortness of breath) 04/07/2019  . Osteoporosis 01/03/2019  . Closed fracture of neck of femur ([place]) 10/17/2018  . Hip  fracture ([place]) 10/01/2018  . Dysuria 05/20/2018  . Right leg swelling 05/20/2018  . CAP (community acquired pneumonia) 05/13/2018  . Tachycardia 05/13/2018  . Chest wall pain 05/13/2018  . Narcotic poisoning ([place]) 05/13/2018  . EtOH dependence ([place])   . Polypharmacy   . Esophagitis   . Preventative health care 12/22/2017  . Erythrocytosis 12/22/2017  . Depression 12/22/2017  . Hyperglycemia 12/22/2017  . Right sided sciatica 12/22/2017  . Cough 06/10/2017  . Family history of colon cancer - brother and father 03/10/2016  . Insomnia 02/11/2016  . Generalized anxiety disorder 11/25/2015  . Urinary frequency 04/25/2015  . Nausea and vomiting in adult 11/08/2014  . Spinal stenosis of lumbar region without neurogenic claudication 02/14/2014  . Cigarette smoker 08/15/2013  . Otitis media 10/27/2012  . Abdominal pain 03/30/2012  . HEPATIC CYST 02/18/2010  . Chronic pain 02/08/2010  . Vitamin B12 deficiency 10/04/2009  . GERD 01/04/2009  . ABDOMINAL PAIN-EPIGASTRIC 01/04/2009  . Depressive disorder 08/10/2008  . Venous (peripheral) insufficiency 08/10/2008  . Irritable bowel syndrome 08/10/2008  . Back pain 08/10/2008  . CIGARETTE SMOKER 03/06/2008  . Hx of adenomatous polyp of colon 12/06/2007  . BRONCHITIS, RECURRENT 12/06/2007  . DIVERTICULOSIS OF COLON 12/06/2007  . Other forms of scoliosis, lumbosacral region 12/06/2007  . CALCULUS, KIDNEY 10/07/2007  . VERTIGO 10/07/2007  . Headache(784.0) 10/07/2007    Past Surgical History:  Procedure Laterality Date  . APPENDECTOMY    . BACK SURGERY  10-09   Dr. Patrice Paradise  . BIOPSY  02/14/2020   Procedure: BIOPSY;  Surgeon: Gatha Mayer, MD;  Location: [facility];  Service: Endoscopy;;  . CARPAL TUNNEL RELEASE Right 08/14/2014   Procedure: RIGHT CARPAL TUNNEL RELEASE AND INJECT LEFT THUMB;  Surgeon: Daryll Brod, MD;  Location: [facility];  Service: Orthopedics;  Laterality: Right;  . CHOLECYSTECTOMY N/A 02/22/2020    Procedure: LAPAROSCOPIC CHOLECYSTECTOMY WITH INTRAOPERATIVE CHOLANGIOGRAM;  Surgeon: Kieth Brightly Arta Bruce, MD;  Location: [facility];  Service: General;  Laterality: N/A;  . COLONOSCOPY    . COLONOSCOPY WITH PROPOFOL N/A 02/14/2020   Procedure: COLONOSCOPY WITH PROPOFOL;  Surgeon: Gatha Mayer, MD;  Location: [facility];  Service: Endoscopy;  Laterality: N/A;  . ENDOSCOPIC RETROGRADE CHOLANGIOPANCREATOGRAPHY (ERCP) WITH PROPOFOL N/A 12/25/2019   Procedure: ENDOSCOPIC RETROGRADE CHOLANGIOPANCREATOGRAPHY (ERCP) WITH PROPOFOL;  Surgeon: Gatha Mayer, MD;  Location: [facility];  Service: Gastroenterology;  Laterality: N/A;  . ESOPHAGOGASTRODUODENOSCOPY (EGD) WITH PROPOFOL N/A 01/06/2020   Procedure: ESOPHAGOGASTRODUODENOSCOPY (EGD) WITH PROPOFOL;  Surgeon: Irene Shipper, MD;  Location[facility];  Service: Endoscopy;  Laterality: N/A;  . HOT HEMOSTASIS N/A 01/06/2020   Procedure: HOT HEMOSTASIS (ARGON PLASMA COAGULATION/BICAP);  Surgeon: Irene Shipper, MD;  Location: [facility] ENDOSCOPY;  Service: Endoscopy;  Laterality: N/A;  . INTRAMEDULLARY (IM) NAIL INTERTROCHANTERIC Right 10/01/2018   Procedure: INTRAMEDULLARY (  IM) NAIL INTERTROCHANTRIC;  Surgeon: Thornton Park, MD;  Location: [facility] ORS;  Service: Orthopedics;  Laterality: Right;  . LAPAROSCOPY N/A 02/21/2013   Procedure: LAPAROSCOPY OPERATIVE;  Surgeon: Margarette Asal, MD;  Location: [facility];  Service: Gynecology;  Laterality: N/A;  REQUESTING 5MM SCOPE WITH CAMERA  . LEFT HEART CATH AND CORONARY ANGIOGRAPHY N/A 05/11/2019   Procedure: LEFT HEART CATH AND CORONARY ANGIOGRAPHY;  Surgeon: Sherren Mocha, MD;  Location: [facility] CV LAB;  Service: Cardiovascular;  Laterality: N/A;  . REMOVAL OF STONES  12/25/2019   Procedure: Karlyn Agee;  Surgeon: Gatha Mayer, MD;  Location: [facility] ENDOSCOPY;  Service: Gastroenterology;;  . Clide Deutscher  01/06/2020   Procedure: Clide Deutscher;  Surgeon: Irene Shipper, MD;  Location: [facility] ENDOSCOPY;  Service:  Endoscopy;;  . Joan Mayans  12/25/2019   Procedure: SPHINCTEROTOMY;  Surgeon: Gatha Mayer, MD;  Location: [facility];  Service: Gastroenterology;;  . TONSILLECTOMY    . TUBAL LIGATION    . UPPER GASTROINTESTINAL ENDOSCOPY       OB History   No obstetric history on file.     Family History  Problem Relation Age of Onset  . Colon cancer Father   . Prostate cancer Father   . Heart disease Mother        prev MVR, also has DJD  . Colon cancer Brother   . Skin cancer Sister   . Alcohol abuse Daughter   . Bipolar disorder Daughter   . Drug abuse Daughter   . Esophageal cancer Neg Hx   . Rectal cancer Neg Hx   . Stomach cancer Neg Hx     Social History   Tobacco Use  . Smoking status: Current Every Day Smoker    Packs/day: 1.00    Years: 30.00    Pack years: 30.00    Types: Cigarettes  . Smokeless tobacco: Never Used  Vaping Use  . Vaping Use: Never used  Substance Use Topics  . Alcohol use: Yes    Alcohol/week: 3.0 standard drinks    Types: 3 Standard drinks or equivalent per week    Comment: occasional  . Drug use: No    Home Medications Prior to Admission medications   Medication Sig Start Date End Date Taking? Authorizing Provider  azithromycin (ZITHROMAX) 250 MG tablet Take 1 tablet (250 mg total) by mouth daily. Take first 2 tablets together, then 1 every day until finished. 05/05/21  Yes Corena Herter, PA-C  predniSONE (DELTASONE) 50 MG tablet Take 1 tablet (50 mg total) by mouth daily with breakfast for 5 days. 05/05/21 05/10/21 Yes Corena Herter, PA-C  sucralfate (CARAFATE) 1 g tablet Take 1 tablet (1 g total) by mouth 4 (four) times daily -  with meals and at bedtime for 14 days. 05/05/21 05/19/21 Yes Corena Herter, PA-C  budesonide (ENTOCORT EC) 3 MG 24 hr capsule TAKE 3 CAPSULES ONCE DAILY. 03/20/21   Gatha Mayer, MD  buPROPion [facility] SR) 150 MG 12 hr tablet Take 1 tablet (150 mg total) by mouth daily. 04/23/21 10/20/21  Elvin So, MD   denosumab (PROLIA) 60 MG/ML SOSY injection Inject 60 mg into the skin every 6 (six) months.    [provider]  diclofenac Sodium (VOLTAREN) 1 % GEL Apply 2 g topically 4 (four) times daily as needed (for joint pain). 02/21/21   Elodia Florence., MD  hyoscyamine (ANASPAZ) 0.125 MG TBDP disintergrating tablet Place 1 tablet (0.125 mg total) under the tongue every 6 (  six) hours as needed ([name] cramps). 04/09/21   Gatha Mayer, MD  methocarbamol (ROBAXIN) 500 MG tablet Take 1 tablet (500 mg total) by mouth every 6 (six) hours as needed for muscle spasms. 02/17/21   Bonnielee Haff, MD  morphine (MS CONTIN) 15 MG 12 hr tablet Take 1 tablet (15 mg total) by mouth 3 (three) times daily. 02/17/21   Bonnielee Haff, MD  nicotine (NICODERM CQ - DOSED IN MG/24 HOURS) 21 mg/24hr patch Place 1 patch (21 mg total) onto the skin daily. 01/05/20   Swayze, Ava, DO  nitroGLYCERIN (NITROSTAT) 0.4 MG SL tablet Place 1 tablet (0.4 mg total) under the tongue every 5 (five) minutes as needed for chest pain. 04/22/20   Josue Hector, MD  ondansetron (ZOFRAN-ODT) 4 MG disintegrating tablet DISSOLVE 1 TABLET ON TONGUE EVERY 8 HOURS AS NEEDED FOR NAUSEA/VOMITING. 04/09/21   Gatha Mayer, MD  Oxycodone HCl 10 MG TABS Take 1 tablet (10 mg total) by mouth 4 (four) times daily as needed. 02/17/21   Bonnielee Haff, MD  pantoprazole (PROTONIX) 40 MG tablet TAKE (1) TABLET TWICE A DAY BEFORE MEALS. Patient taking differently: Take 40 mg by mouth 2 (two) times daily as needed (acid reflux). 01/29/21   Gatha Mayer, MD  rosuvastatin (CRESTOR) 5 MG tablet Take 1 tablet (5 mg total) by mouth 3 (three) times a week. 08/28/20   Josue Hector, MD  sertraline (ZOLOFT) 100 MG tablet Take 2 tablets (200 mg total) by mouth at bedtime. 04/23/21 10/20/21  Elvin So, MD  spironolactone (ALDACTONE) 25 MG tablet Take 0.5 tablets (12.5 mg total) by mouth daily. Patient taking differently: Take 12.5 mg by mouth daily as needed  (swelling). 04/22/20   Josue Hector, MD  valACYclovir (VALTREX) 500 MG tablet Take 500 mg by mouth 2 (two) times daily as needed (flare ups).     [provider]    Allergies    Atorvastatin, Levofloxacin, Omnicef [cefdinir], Trazodone and nefazodone, Sulfa antibiotics, and Sulfonamide derivatives  Review of Systems   Review of Systems  All other systems reviewed and are negative.   Physical Exam Updated Vital Signs BP 129/74   Pulse 93   Temp 99 F (37.2 C) (Oral)   Resp 14   Ht 4\' 5"  (1.346 m)   Wt 49.9 kg   SpO2 98%   BMI 27.53 kg/m   Physical Exam Vitals and nursing note reviewed. Exam conducted with a chaperone present.  Constitutional:      Appearance: Normal appearance.  HENT:     Head: Normocephalic and atraumatic.  Eyes:     General: No scleral icterus.    Conjunctiva/sclera: Conjunctivae normal.  Cardiovascular:     Rate and Rhythm: Normal rate.     Pulses: Normal pulses.  Pulmonary:     Effort: No respiratory distress.     Breath sounds: No wheezing or rales.     Comments: Difficulty speaking in full sentence without catching her breath.  Shallow breathing.  Borderline tachypnea.  98% SPO2 on her regular 2 L supplemental oxygen.  No wheezing auscultated on exam.  CTA. Chest:     Chest wall: Tenderness present.  Abdominal:     General: Abdomen is flat. There is no distension.     Palpations: Abdomen is soft.     Tenderness: There is no abdominal tenderness.  Musculoskeletal:        General: Normal range of motion.     Right lower leg: No edema.  Left lower leg: No edema.  Skin:    General: Skin is dry.  Neurological:     Mental Status: She is alert and oriented to person, place, and time.     GCS: GCS eye subscore is 4. GCS verbal subscore is 5. GCS motor subscore is 6.  Psychiatric:        Mood and Affect: Mood normal.        Behavior: Behavior normal.        Thought Content: Thought content normal.     ED Results / Procedures /  Treatments   Labs (all labs ordered are listed, but only abnormal results are displayed) Labs Reviewed  CBC WITH DIFFERENTIAL/PLATELET - Abnormal; Notable for the following components:      Result Value   RBC 3.77 (*)    Hemoglobin 11.7 (*)    Monocytes Absolute 0.0 (*)    All other components within normal limits  COMPREHENSIVE METABOLIC PANEL - Abnormal; Notable for the following components:   Potassium 2.9 (*)    Glucose, Bld 105 (*)    BUN 7 (*)    Calcium 8.5 (*)    Total Protein 6.0 (*)    Albumin 3.3 (*)    All other components within normal limits  TROPONIN I (HIGH SENSITIVITY)    EKG None  Radiology CT Angio Chest PE W and/or Wo Contrast  Result Date: 05/05/2021 CLINICAL DATA:  Short of breath and chest pain over the last week. EXAM: CT ANGIOGRAPHY CHEST WITH CONTRAST TECHNIQUE: Multidetector CT imaging of the chest was performed using the standard protocol during bolus administration of intravenous contrast. Multiplanar CT image reconstructions and MIPs were obtained to evaluate the vascular anatomy. CONTRAST:  61mL OMNIPAQUE IOHEXOL 350 MG/ML SOLN COMPARISON:  10/30/2020. FINDINGS: Cardiovascular: Satisfactory opacification of the pulmonary arteries to the segmental level. No evidence of pulmonary embolism. Heart normal in size. Three-vessel coronary artery calcifications. No pericardial effusion. Great vessels are normal in caliber. No aortic dissection. Aortic atherosclerosis. Arch branch vessels are widely patent. Mediastinum/Nodes: No neck base, mediastinal or hilar masses or enlarged lymph nodes. Trachea and esophagus are unremarkable. Lungs/Pleura: Moderate emphysema. Minor dependent atelectasis in the lower lobes, greater on the right. There are areas of bronchial wall thickening with some partially filled with mucus, most evident in the lower lobes. No mass or suspicious nodule. No pleural effusion or pneumothorax. Upper Abdomen: No acute findings. Musculoskeletal: Mild  compression fracture of T4 new since the prior CT. Previously seen fractures of the sternum have healed. No other fractures. No bone lesions. No chest wall mass. Review of the MIP images confirms the above findings. IMPRESSION: 1. No evidence of a pulmonary embolism. 2. No acute findings in the lungs. 3. Moderate emphysema. Mild areas of bronchial wall thickening with some partly mucous filled consistent with chronic bronchitis. 4. Mild compression fracture of T4, of unclear chronicity, but new since the CT dated 10/30/2020. Aortic Atherosclerosis (ICD10-I70.0) and Emphysema (ICD10-J43.9). Electronically Signed   By: Lajean Manes M.D.   On: 05/05/2021 16:36   DG Chest Portable 1 View  Result Date: 05/05/2021 CLINICAL DATA:  Shortness of breath EXAM: PORTABLE CHEST 1 VIEW COMPARISON:  February 09, 2021 and July 04, 2020 FINDINGS: Subtle increased opacity in the right mid lung is stable and may represent chronic scarring. Lungs elsewhere are clear. Heart size and pulmonary vascularity normal. No adenopathy. There is aortic atherosclerosis as well as calcification in right subclavian artery region. There is upper thoracic levoscoliosis. IMPRESSION: Stable  increased opacity right mid lung which may represent scarring. No frank edema or consolidation evident. Stable cardiac silhouette. Aortic Atherosclerosis (ICD10-I70.0). Electronically Signed   By: Lowella Grip III M.D.   On: 05/05/2021 12:27    Procedures Procedures   Medications Ordered in ED Medications  albuterol (VENTOLIN HFA) 108 (90 Base) MCG/ACT inhaler 2 puff (has no administration in time range)  potassium chloride SA (KLOR-CON) CR tablet 40 mEq (has no administration in time range)  iohexol (OMNIPAQUE) 350 MG/ML injection 75 mL (75 mLs Intravenous Contrast Given 05/05/21 1628)    ED Course  I have reviewed the triage vital signs and the nursing notes.  Pertinent labs & imaging results that were available during my care of the patient  were reviewed by me and considered in my medical decision making (see chart for details).    MDM Rules/Calculators/A&P                          Denise King was evaluated in Emergency Department on 05/05/2021 for the symptoms described in the history of present illness. She was evaluated in the context of the global COVID-19 pandemic, which necessitated consideration that the patient might be at risk for infection with the SARS-CoV-2 virus that causes COVID-19. Institutional protocols and algorithms that pertain to the evaluation of patients at risk for COVID-19 are in a state of rapid change based on information released by regulatory bodies including the CDC and federal and state organizations. These policies and algorithms were followed during the patient's care in the ED.  I personally reviewed patient's medical chart and all notes from triage and staff during today's encounter. I have also ordered and reviewed all labs and imaging that I felt to be medically necessary in the evaluation of this patient's complaints and with consideration of their physical exam. If needed, translation services were available and utilized.   Patient's complaining of difficulty with deep inspiration and is exhibiting shallow breathing.  Will proceed with chest x-ray and basic laboratory work-up.  Lung sounds are clear to auscultation.  Denies hx of blood clots, but may ultimately proceed with CTA PE study for better evaluation.    Initial troponin 17, do not need to trend.  CMP notable for hypokalemia 2.9, no other significant derangement.  CBC without significant anemia or leukocytosis concerning for infection.  Chest x-ray is personally reviewed and demonstrated a stable yet increased (?)  opacity in right midlung, may represent scarring.  Proceed with CT angio chest PE study given patient's shallow and mildly increased work of breathing which demonstrated no evidence of pulmonary embolism, mass, or suspicious  nodule.  There is also no focal consolidation concerning for pneumonia.  There was however moderate emphysema noted with minor dependent atelectasis and areas of bronchial wall thickening, some partially filled with mucus.    She continues to be 98% on her usual 2 L supplemental oxygen via [place].  Repleted patient's potassium here in the ER.  Treat her with 2 puffs albuterol.  On subsequent evaluation, she is feeling improved after steroids and albuterol treatment.  She feels prepared for discharge.  I will provide ambulatory referral for her to pulmonology for ongoing evaluation and management.  I will also discharge her home with a 5-day course of prednisone burst as well as azithromycin.  We will give her Carafate for her difficulty swallowing/globus sensation and encourage her to follow-up with her gastroenterologist.  Last EGD obtained 12/2019 was largely  benign.  Encouraging her to continue with her daily Protonix.  ER return precautions discussed.  Patient voices understanding is agreeable to the plan.  Final Clinical Impression(s) / ED Diagnoses Final diagnoses:  Bronchitis  Globus sensation  Hypokalemia    Rx / DC Orders ED Discharge Orders         Ordered    azithromycin (ZITHROMAX) 250 MG tablet  Daily        05/05/21 1816    predniSONE (DELTASONE) 50 MG tablet  Daily with breakfast        05/05/21 1816    sucralfate (CARAFATE) 1 g tablet  3 times daily with meals & bedtime        05/05/21 1816    Ambulatory referral to Pulmonology       Comments: Chronic resp failure COPD   05/05/21 1816           Corena Herter, PA-C 05/05/21 Ysidro Evert, MD 05/06/21 [phone]

## 2021-05-05 NOTE — Discharge Instructions (Addendum)
Your work-up today was largely reassuring.  Suspect that your shortness of breath, cough, and chest tightness is in context of bronchitis.  This is supported by the CT obtained of your chest.  Please take the prednisone and Z-Pak, as directed.  You will receive a phone call from pulmonology.  I encourage you to get established given your chronic bronchitis and COPD.  As for the difficulty swallowing, suspect globus sensation secondary to GERD.  In addition to your daily times, only treated take the Carafate, as directed.  Your potassium was also noted to be mildly low.  It was repleted here in the ER.  Please follow-up with your primary care provider for repeat labs to ensure correction of your electrolyte derangement.  Return to the ER or seek immediate medical attention should you experience any new or worsening symptoms.

## 2021-05-06 IMAGING — DX RIGHT KNEE - COMPLETE 4+ VIEW
4 series · 4 of 4 positions shown · non-contrast
Comparison: CT of the right hip dated 07/31/2019

CLINICAL DATA: Right knee and right femur pain and swelling for 2
weeks.

EXAM:
RIGHT FEMUR 2 VIEWS; RIGHT KNEE - COMPLETE 4+ VIEW

[knee ap]
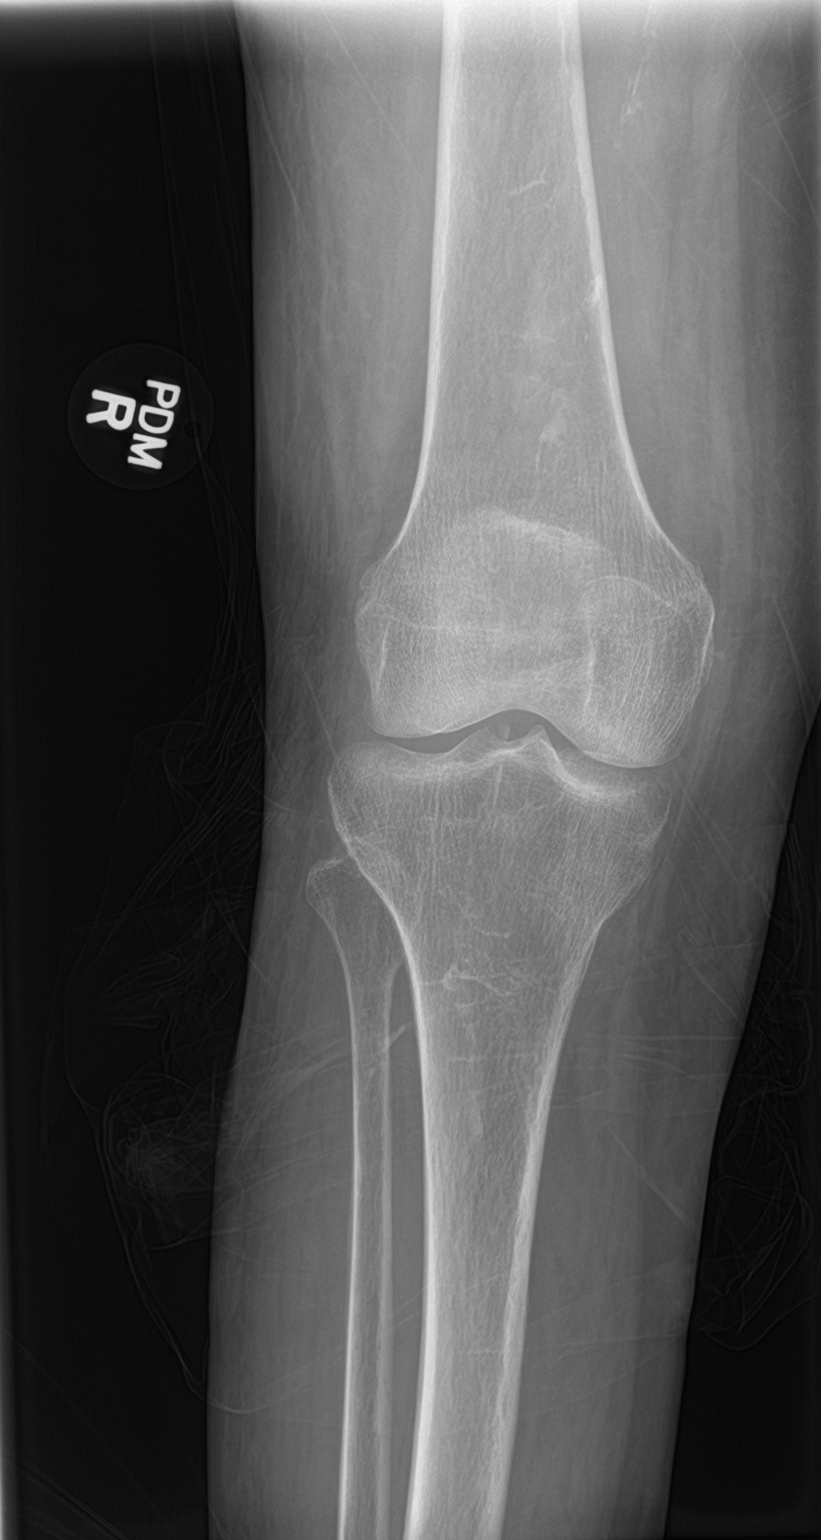

[knee lat]
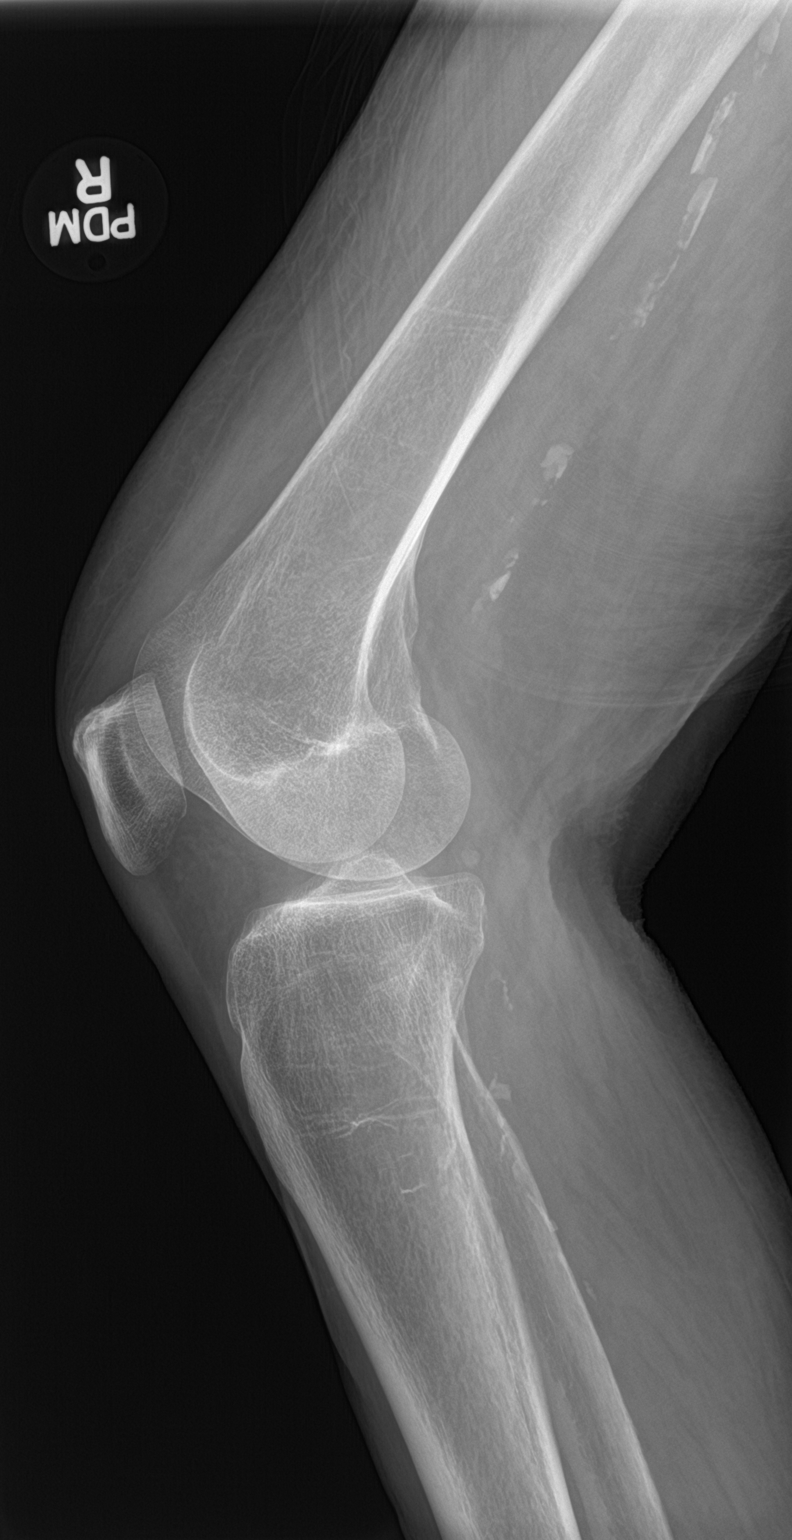

[knee obl (1 of 2)]
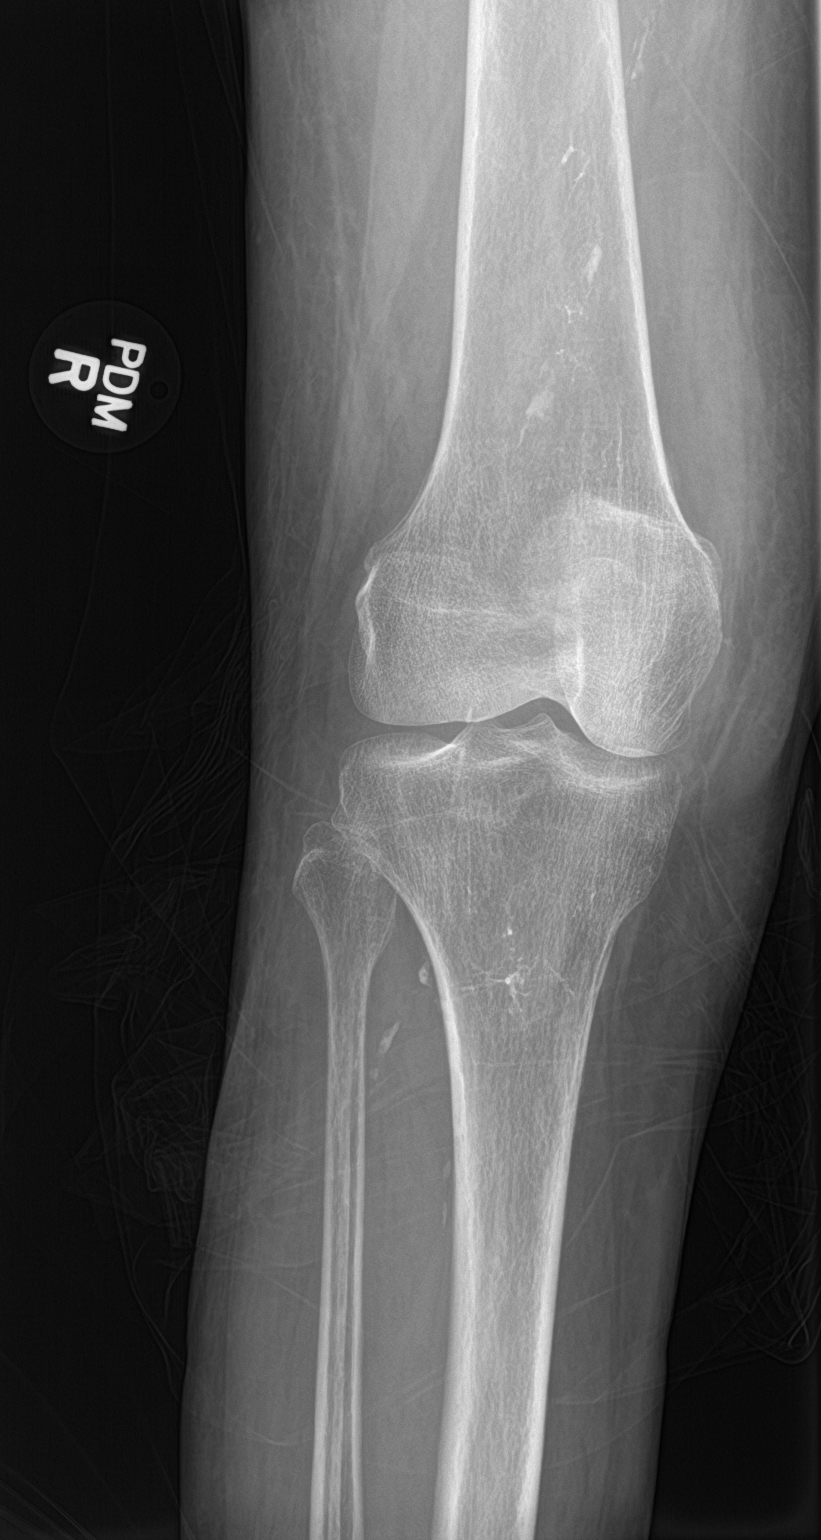

[knee obl (2 of 2)]
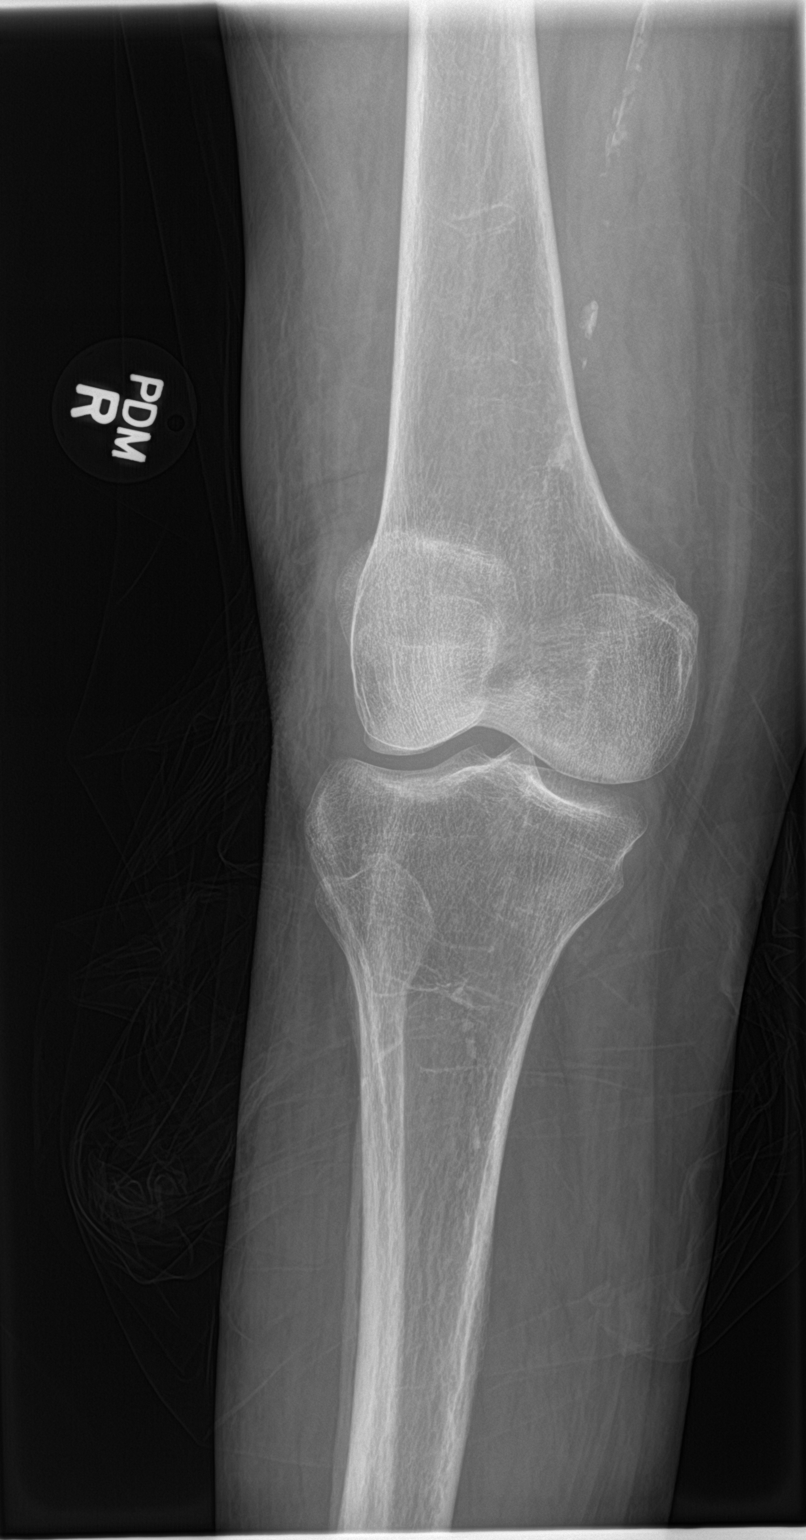

[4 of 4 positions shown; findings below may reference images not displayed]

FINDINGS: Right femur:

The patient is status post fixation of an intertrochanteric fracture
with an intramedullary hip screw. Hardware is in good position
without evidence of loosening. Dystrophic calcifications above the
greater trochanter are redemonstrated. No acute osseous abnormality
is identified. The joint spaces are preserved. Vascular
calcifications are noted in the thigh.

Right knee:

No acute osseous abnormality is identified. The joint spaces are
preserved and there is no knee joint effusion. Vascular
calcifications are noted in the thigh and leg.
IMPRESSION: No acute osseous abnormality of the right femur and right knee.

## 2021-05-06 IMAGING — DX RIGHT FEMUR 2 VIEWS
4 series · 4 of 4 positions shown · non-contrast
Comparison: CT of the right hip dated 07/31/2019

CLINICAL DATA: Right knee and right femur pain and swelling for 2
weeks.

EXAM:
RIGHT FEMUR 2 VIEWS; RIGHT KNEE - COMPLETE 4+ VIEW

[femur ap (1 of 2)]
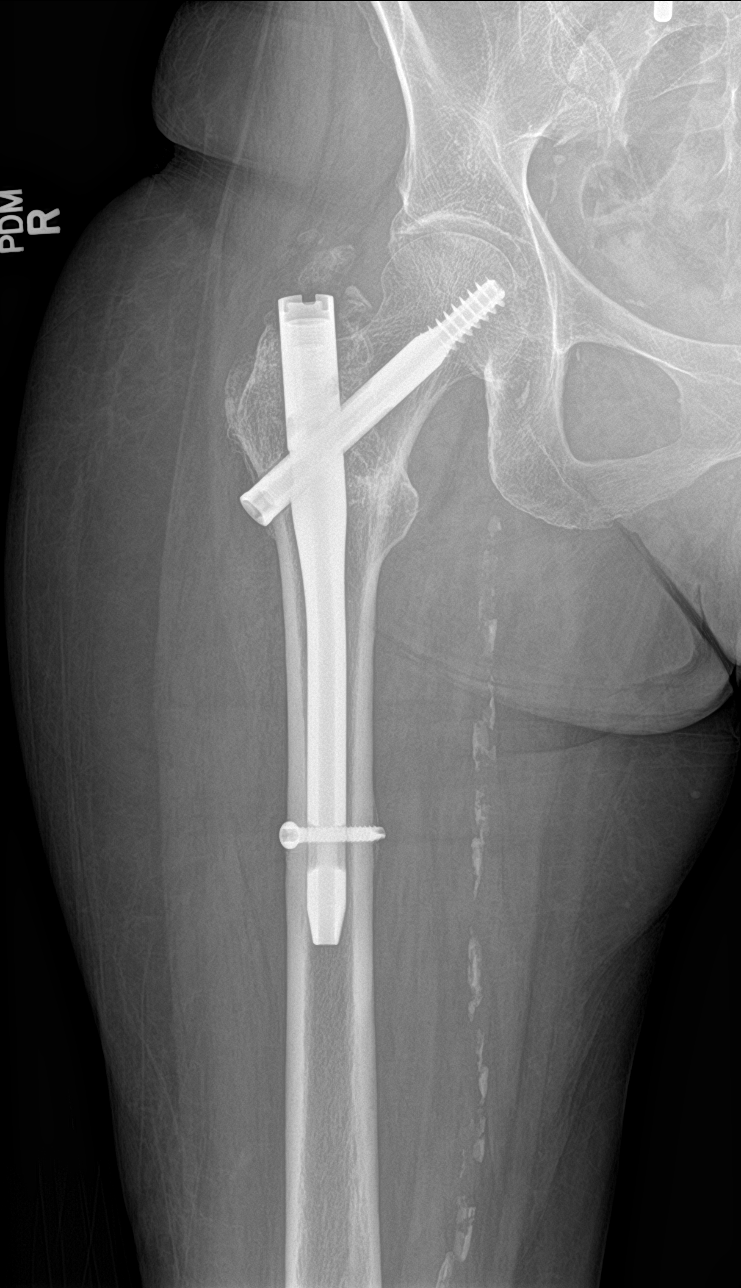

[femur ap (2 of 2)]
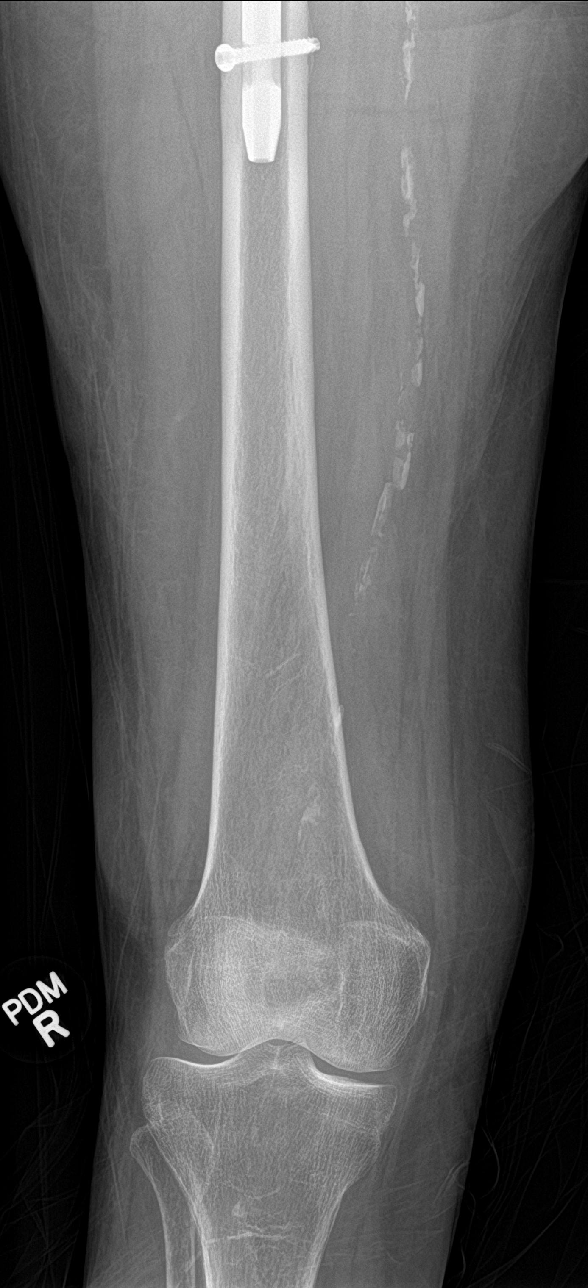

[femur lat (1 of 2)]
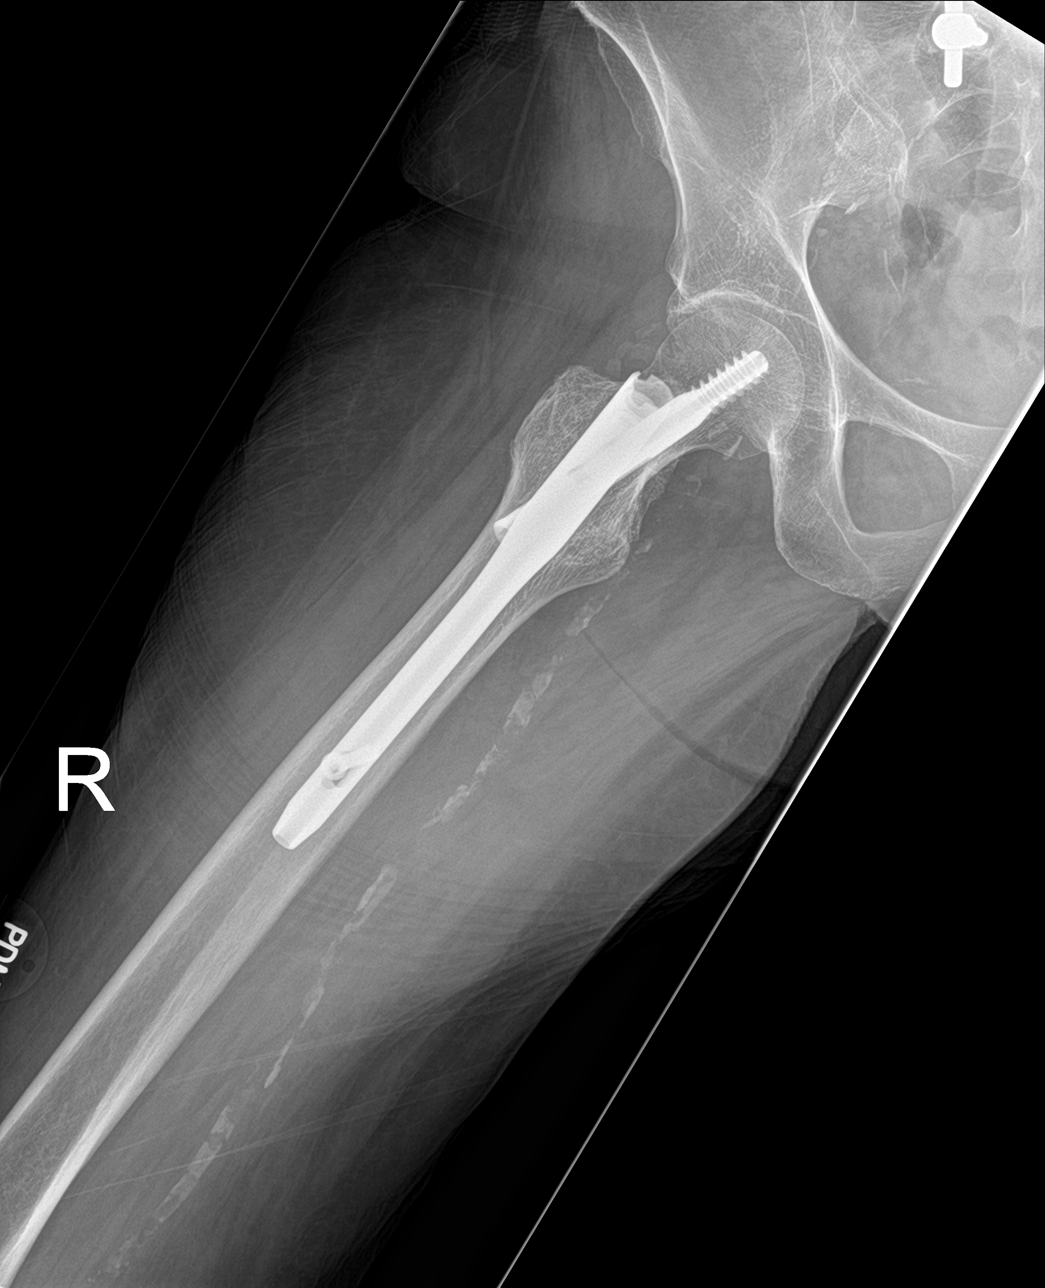

[femur lat (2 of 2)]
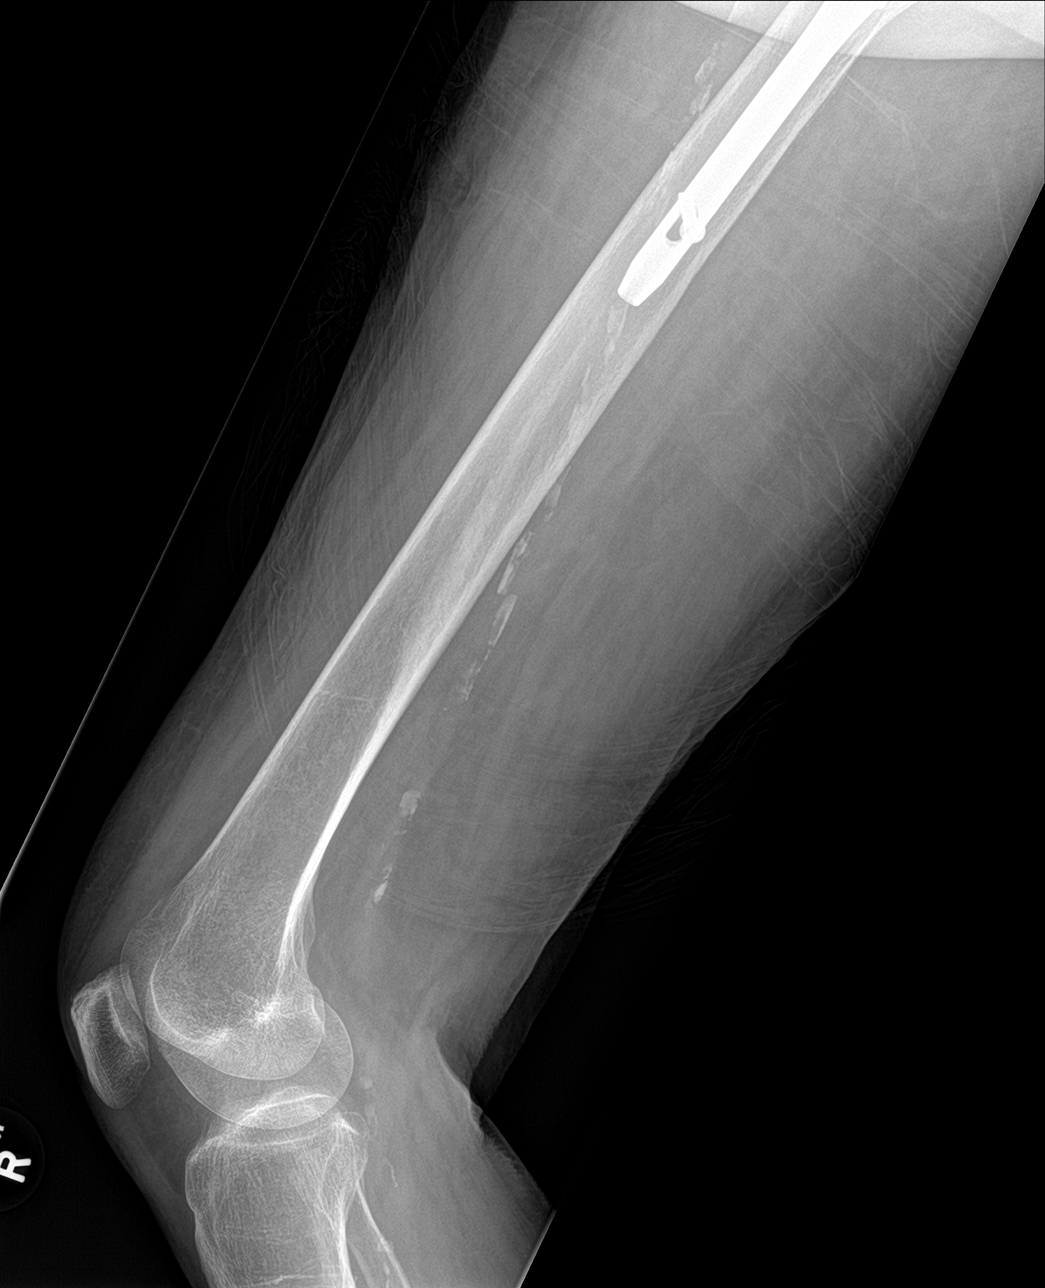

[4 of 4 positions shown; findings below may reference images not displayed]

FINDINGS: Right femur:

The patient is status post fixation of an intertrochanteric fracture
with an intramedullary hip screw. Hardware is in good position
without evidence of loosening. Dystrophic calcifications above the
greater trochanter are redemonstrated. No acute osseous abnormality
is identified. The joint spaces are preserved. Vascular
calcifications are noted in the thigh.

Right knee:

No acute osseous abnormality is identified. The joint spaces are
preserved and there is no knee joint effusion. Vascular
calcifications are noted in the thigh and leg.
IMPRESSION: No acute osseous abnormality of the right femur and right knee.

## 2021-05-07 ENCOUNTER — Telehealth: Payer: Self-pay | Admitting: Internal Medicine

## 2021-05-07 NOTE — Telephone Encounter (Signed)
I spoke with Denise King, the ED notes are in epic. She has a July appointment with Dr Carlean Purl. She is concerned that they gave her prednisone and she takes budesonide and she wanted to make sure that is okay with Dr Carlean Purl. I will route to him to see if he wants her appointment moved up.

## 2021-05-08 NOTE — Telephone Encounter (Signed)
Chart reviewed  Agree w/ ED care and rxs   I do not see a need to move up appointment but if she has GI issues in the meantime let us know - I saw she was c/o some dysphagia but would see how that goes w/ tx provided

## 2021-05-08 NOTE — Telephone Encounter (Signed)
Patient informed. 

## 2021-05-09 DIAGNOSIS — I509 Heart failure, unspecified: Secondary | ICD-10-CM | POA: Diagnosis not present

## 2021-05-09 NOTE — Telephone Encounter (Signed)
Denise King is having pain behind her belly button. No vomiting in past several hours. Her ondansetron has helped some. She had a bad headache this AM. Please advise Sir. She has a covid test at home but hasn't taken it.

## 2021-05-09 NOTE — Telephone Encounter (Signed)
I informed Denise King of the plan .

## 2021-05-09 NOTE — Telephone Encounter (Signed)
Given that she has had respiratory symptoms prior to all this it does make sense to take the COVID test and see if it is positive.  I would recommend staying on clear liquids and then advancing to a brat diet when able.  Use her ondansetron as needed.  Beyond that if she fails to improve or worsens she would need to go to the emergency department.  If she does not feel significantly better by Monday as well she can check back with [place] and we can see about working her into be seen next week

## 2021-05-09 NOTE — Telephone Encounter (Signed)
Inbound call from patient asking for PJ. Patient said today she feels worse. Cant stop vomiting and belly is burning. Would like a call back [phone]

## 2021-05-13 ENCOUNTER — Other Ambulatory Visit: Payer: Self-pay | Admitting: Internal Medicine

## 2021-05-13 ENCOUNTER — Ambulatory Visit: Payer: Medicare HMO | Admitting: Internal Medicine

## 2021-05-15 DIAGNOSIS — F17200 Nicotine dependence, unspecified, uncomplicated: Secondary | ICD-10-CM | POA: Diagnosis not present

## 2021-05-15 DIAGNOSIS — G8929 Other chronic pain: Secondary | ICD-10-CM | POA: Diagnosis not present

## 2021-05-15 DIAGNOSIS — F17210 Nicotine dependence, cigarettes, uncomplicated: Secondary | ICD-10-CM | POA: Diagnosis not present

## 2021-05-15 DIAGNOSIS — R079 Chest pain, unspecified: Secondary | ICD-10-CM | POA: Diagnosis not present

## 2021-05-15 DIAGNOSIS — M546 Pain in thoracic spine: Secondary | ICD-10-CM | POA: Diagnosis not present

## 2021-05-15 DIAGNOSIS — M545 Low back pain, unspecified: Secondary | ICD-10-CM | POA: Diagnosis not present

## 2021-05-15 DIAGNOSIS — M25561 Pain in right knee: Secondary | ICD-10-CM | POA: Diagnosis not present

## 2021-05-15 DIAGNOSIS — R69 Illness, unspecified: Secondary | ICD-10-CM | POA: Diagnosis not present

## 2021-05-15 DIAGNOSIS — Z79899 Other long term (current) drug therapy: Secondary | ICD-10-CM | POA: Diagnosis not present

## 2021-05-16 DIAGNOSIS — R0602 Shortness of breath: Secondary | ICD-10-CM | POA: Diagnosis not present

## 2021-05-16 DIAGNOSIS — R0902 Hypoxemia: Secondary | ICD-10-CM | POA: Diagnosis not present

## 2021-05-16 DIAGNOSIS — R000 Tachycardia, unspecified: Secondary | ICD-10-CM | POA: Diagnosis not present

## 2021-05-16 DIAGNOSIS — S72001A Fracture of unspecified part of neck of right femur, initial encounter for closed fracture: Secondary | ICD-10-CM | POA: Diagnosis not present

## 2021-05-16 DIAGNOSIS — S22000A Wedge compression fracture of unspecified thoracic vertebra, initial encounter for closed fracture: Secondary | ICD-10-CM | POA: Insufficient documentation

## 2021-05-16 DIAGNOSIS — I10 Essential (primary) hypertension: Secondary | ICD-10-CM | POA: Diagnosis not present

## 2021-05-16 DIAGNOSIS — Z743 Need for continuous supervision: Secondary | ICD-10-CM | POA: Diagnosis not present

## 2021-05-16 DIAGNOSIS — M4854XA Collapsed vertebra, not elsewhere classified, thoracic region, initial encounter for fracture: Secondary | ICD-10-CM | POA: Diagnosis not present

## 2021-05-17 ENCOUNTER — Emergency Department (HOSPITAL_COMMUNITY): Payer: Medicare HMO

## 2021-05-17 ENCOUNTER — Other Ambulatory Visit: Payer: Self-pay

## 2021-05-17 ENCOUNTER — Emergency Department (HOSPITAL_COMMUNITY)
Admission: EM | Admit: 2021-05-17 | Discharge: 2021-05-17 | Disposition: A | Discharge status: Home / Self Care | Payer: Medicare HMO | Attending: Emergency Medicine | Admitting: Emergency Medicine

## 2021-05-17 ENCOUNTER — Encounter (HOSPITAL_COMMUNITY): Payer: Self-pay

## 2021-05-17 DIAGNOSIS — K449 Diaphragmatic hernia without obstruction or gangrene: Secondary | ICD-10-CM | POA: Diagnosis not present

## 2021-05-17 DIAGNOSIS — R110 Nausea: Secondary | ICD-10-CM | POA: Diagnosis not present

## 2021-05-17 DIAGNOSIS — F17210 Nicotine dependence, cigarettes, uncomplicated: Secondary | ICD-10-CM | POA: Diagnosis not present

## 2021-05-17 DIAGNOSIS — M546 Pain in thoracic spine: Secondary | ICD-10-CM | POA: Diagnosis not present

## 2021-05-17 DIAGNOSIS — I509 Heart failure, unspecified: Secondary | ICD-10-CM | POA: Insufficient documentation

## 2021-05-17 DIAGNOSIS — G8929 Other chronic pain: Secondary | ICD-10-CM

## 2021-05-17 DIAGNOSIS — I119 Hypertensive heart disease without heart failure: Secondary | ICD-10-CM | POA: Insufficient documentation

## 2021-05-17 DIAGNOSIS — E876 Hypokalemia: Secondary | ICD-10-CM | POA: Diagnosis not present

## 2021-05-17 DIAGNOSIS — R109 Unspecified abdominal pain: Secondary | ICD-10-CM | POA: Insufficient documentation

## 2021-05-17 DIAGNOSIS — K219 Gastro-esophageal reflux disease without esophagitis: Secondary | ICD-10-CM | POA: Insufficient documentation

## 2021-05-17 DIAGNOSIS — R1013 Epigastric pain: Secondary | ICD-10-CM | POA: Diagnosis not present

## 2021-05-17 DIAGNOSIS — N8110 Cystocele, unspecified: Secondary | ICD-10-CM | POA: Diagnosis not present

## 2021-05-17 DIAGNOSIS — N2889 Other specified disorders of kidney and ureter: Secondary | ICD-10-CM | POA: Diagnosis not present

## 2021-05-17 DIAGNOSIS — R69 Illness, unspecified: Secondary | ICD-10-CM | POA: Diagnosis not present

## 2021-05-17 LAB — CBC
HCT: 35.8 % — ABNORMAL LOW (ref 36.0–46.0)
Hemoglobin: 11.6 g/dL — ABNORMAL LOW (ref 12.0–15.0)
MCH: 31.5 pg (ref 26.0–34.0)
MCHC: 32.4 g/dL (ref 30.0–36.0)
MCV: 97.3 fL (ref 80.0–100.0)
Platelets: 300 10*3/uL (ref 150–400)
RBC: 3.68 MIL/uL — ABNORMAL LOW (ref 3.87–5.11)
RDW: 14 % (ref 11.5–15.5)
WBC: 9.3 10*3/uL (ref 4.0–10.5)
nRBC: 0 % (ref 0.0–0.2)

## 2021-05-17 LAB — COMPREHENSIVE METABOLIC PANEL
ALT: 9 U/L (ref 0–44)
AST: 15 U/L (ref 15–41)
Albumin: 3.2 g/dL — ABNORMAL LOW (ref 3.5–5.0)
Alkaline Phosphatase: 73 U/L (ref 38–126)
Anion gap: 7 (ref 5–15)
BUN: 7 mg/dL — ABNORMAL LOW (ref 8–23)
CO2: 36 mmol/L — ABNORMAL HIGH (ref 22–32)
Calcium: 8.9 mg/dL (ref 8.9–10.3)
Chloride: 94 mmol/L — ABNORMAL LOW (ref 98–111)
Creatinine, Ser: 0.75 mg/dL (ref 0.44–1.00)
GFR, Estimated: >60 mL/min (ref >60)
Glucose, Bld: 100 mg/dL — ABNORMAL HIGH (ref 70–99)
Potassium: 3.1 mmol/L — ABNORMAL LOW (ref 3.5–5.1)
Sodium: 137 mmol/L (ref 135–145)
Total Bilirubin: 0.3 mg/dL (ref 0.3–1.2)
Total Protein: 6.1 g/dL — ABNORMAL LOW (ref 6.5–8.1)

## 2021-05-17 LAB — LIPASE, BLOOD: Lipase: 24 U/L (ref 11–51)

## 2021-05-17 LAB — URINALYSIS, ROUTINE W REFLEX MICROSCOPIC
Bilirubin Urine: NEGATIVE
Glucose, UA: NEGATIVE mg/dL
Hgb urine dipstick: NEGATIVE
Ketones, ur: NEGATIVE mg/dL
Leukocytes,Ua: NEGATIVE
Nitrite: NEGATIVE
Protein, ur: NEGATIVE mg/dL
Specific Gravity, Urine: 1.011 (ref 1.005–1.030)
pH: 6 (ref 5.0–8.0)

## 2021-05-17 LAB — TROPONIN I (HIGH SENSITIVITY)
Troponin I (High Sensitivity): 41 ng/L — ABNORMAL HIGH (ref <18)
Troponin I (High Sensitivity): 41 ng/L — ABNORMAL HIGH (ref <18)

## 2021-05-17 MED ORDER — POTASSIUM CHLORIDE ER 10 MEQ PO TBCR: 10.0000 meq | EXTENDED_RELEASE_TABLET | Freq: Every day | ORAL | 0 refills | Status: DC

## 2021-05-17 MED ORDER — HYDROMORPHONE HCL 1 MG/ML IJ SOLN
1.0000 mg | Freq: Once | INTRAMUSCULAR | Status: AC
  Administered 2021-05-17: 1 mg via INTRAVENOUS
  Filled 2021-05-17: qty 1

## 2021-05-17 MED ORDER — POTASSIUM CHLORIDE 10 MEQ/100ML IV SOLN
10.0000 meq | Freq: Once | INTRAVENOUS | Status: AC
  Administered 2021-05-17: 10 meq via INTRAVENOUS
  Filled 2021-05-17: qty 100

## 2021-05-17 MED ORDER — LIDOCAINE VISCOUS HCL 2 % MT SOLN
15.0000 mL | Freq: Once | OROMUCOSAL | Status: AC
  Administered 2021-05-17: 15 mL via ORAL
  Filled 2021-05-17: qty 15

## 2021-05-17 MED ORDER — ALUM & MAG HYDROXIDE-SIMETH 200-200-20 MG/5ML PO SUSP
30.0000 mL | Freq: Once | ORAL | Status: AC
  Administered 2021-05-17: 30 mL via ORAL
  Filled 2021-05-17: qty 30

## 2021-05-17 MED ORDER — HYDROMORPHONE HCL 1 MG/ML IJ SOLN
2.0000 mg | Freq: Once | INTRAMUSCULAR | Status: AC
  Administered 2021-05-17: 2 mg via INTRAVENOUS
  Filled 2021-05-17: qty 2

## 2021-05-17 NOTE — Discharge Instructions (Addendum)
Your work-up in the ER for abdominal pain and back pain.  Your work-up included a CT scan of the abdomen, and blood test.  We did not see any signs of life-threatening emergencies on these work-ups.  Your blood test that showed your potassium remains mildly low at 3.1, but is improving from a few days ago.  We gave you some IV potassium for this today.  I prescribed a few potassium pills to take at home.    Your CT scan showed that you may be a little constipated.  I would recommend you consider an over-the-counter laxative, such as Dulcolax, if you are not able to have a bowel movement by tomorrow.  It is very likely that your pain is coming from the scoliosis or bending of your spine. I discussed with you the importance of following up with your spine doctor and pain management specialist for your chronic pain.    The emergency department does not provide additional opioids or narcotics for chronic pain, and strongly recommends the patient is contact their pain specialist first before coming to the ER.

## 2021-05-17 NOTE — ED Notes (Signed)
Pt is ambulatory to the bathroom with a steady gait. 

## 2021-05-17 NOTE — ED Triage Notes (Signed)
Patient reports she was recently treated for bronchitis, finished her abx, now with epigastric abdominal pain with nausea and back pain

## 2021-05-17 NOTE — ED Provider Notes (Signed)
  Emergency Medicine Provider in Triage Note   MSE was initiated and I personally evaluated the patient  1:33 AM on May 17, 2021 as provider in triage.   Chief Complaint: abdominal pain  HPI  Patient is a 70 y.o. who presents to the ED with complaints of abdominal pain x 3 days. LUQ, constant, difficulty to describe, pain in the past as well. No alleviating/aggravating factors. Associated nausea & at time dyspnea. Last BM day before yesterday, has passed gas   Review of Systems  Positive: Abdominal pain, dyspnea, nausea Negative: Melena, hematemesis, emesis, syncope  Physical Exam  BP (!) 142/92 (BP Location: Right Arm)   Pulse 91   Temp 98.1 F (36.7 C) (Oral)   Resp 19   Ht 5\' 3"  (1.6 m)   Wt 50.8 kg   SpO2 100%   BMI 19.84 kg/m    Gen:   Awake, no distress   HEENT:  Atraumatic  Resp:  Normal effort  Cardiac:  Normal rate  Abd:   Tender to palpation to the epigastrium & LUQ.  MSK:   Moves extremities without difficulty  Neuro:  Speech clear   Medical Decision Making   Initiation of care has begun. The patient has been counseled on the process, plan, and necessity for staying for the completion/evaluation, informed that the remainder of the evaluation will be completed by another provider, this initial triage assessment does not replace that evaluation, and the importance of remaining in the ED until their evaluation is complete.   Clinical Impression  Abdominal pain        Amaryllis Dyke, PA-C 05/17/21 0135    Maudie Flakes, MD 05/17/21 [phone]

## 2021-05-17 NOTE — ED Provider Notes (Signed)
[facility] EMERGENCY DEPARTMENT Provider Note   CSN: [id number] Arrival date & time: 05/17/21  0019     History Chief Complaint  Patient presents with  . Abdominal Pain    Denise King is a 70 y.o. female with history of chronic pain, severe kyphosis and scoliosis of the spine, Crohn's disease, COPD/smoker on chronic oxygen, presented to emergency department abdominal pain.  She reports she is having a flareup of her chronic pain located near her epigastrium and also in her lower back.  She says she has had this kind of episodes in the past, but this 1 is "much more severe".  It began approximately 4 days ago, gradual onset, has been persistent for 4 days.  Nothing makes it better, but it appears to be worse when she is laying flat.  She does go to a pain clinic for chronic pain and is on oxycodone 10 mg and morphine ER 15 mg tablets, but states it's not "touching" her pain.  Pain is currently 10 out of 10.  She reports nausea but denies vomiting.  She reports her last bowel movement was 2 days ago.  She says normally she has no issues with opioid-induced constipation, does not take laxatives, but says that she has loose bowel movements to her Crohn's.  She does not feel constipated.  She is passing gas.  She follows with [facility] for her spine condition.  Of note, the patient was seen in the emergency department approximately 2 weeks ago with shortness of breath.  She had a PE scan at that time which showed no acute PE, no evidence of aortic aneurysm or dissection.  HPI     Past Medical History:  Diagnosis Date  . Acute cystitis   . Acute respiratory failure ([place])   . Allergy    as a child grew out of them  . Anemia    pernicious anemia  . Anxiety   . Arthritis   . Back pain   . Blood transfusion   . CAP (community acquired pneumonia)   . CHF (congestive heart failure) ([place])   . Chronic pain syndrome   . Common bile duct stone   . Depression   .  Diverticulosis of colon   . Duodenal ulcer hemorrhagic-after biliary sphincterotomy   . Dysthymia   . Esophagitis   . EtOH dependence ([place])   . Gastritis   . GERD (gastroesophageal reflux disease)   . H/O chest pain Dec. 2013   no work up done  . Hx of colonic polyps   . Hypertension   . Irritable bowel syndrome   . Lymphocytic colitis 02/16/2020  . Osteopenia   . Osteoporosis   . Pneumonia 2019  . Polypharmacy   . Reflux esophagitis   . Right sided sciatica 12/22/2017  . Tobacco use disorder   . Unspecified chronic bronchitis ([place])   . Vertigo   . Vitamin B12 deficiency   . Wears glasses     Patient Active Problem List   Diagnosis Date Noted  . Aortic atherosclerosis ([place]) 03/13/2021  . Genital herpes simplex 03/12/2021  . Hyperlipidemia 03/12/2021  . Severe sepsis ([place]) 02/10/2021  . Acute cystitis 02/10/2021  . Left hip pain 02/10/2021  . Acute metabolic encephalopathy [id number]  . Toe fracture, right 02/01/2021  . Concussion 02/01/2021  . Floaters, bilateral 01/31/2021  . Acute on chronic respiratory failure with hypoxia and hypercapnia ([place]) 07/10/2020  . Acute on chronic heart failure ([place]) 07/04/2020  . Choledocholithiasis  02/22/2020  . Lymphocytic colitis 02/16/2020  . Duodenal ulcer hemorrhagic   . Malnutrition of moderate degree 01/01/2020  . Acute respiratory failure with hypoxia and hypercapnia ([place]) 12/28/2019  . Bipolar disorder ([place]) 12/28/2019  . Alcohol abuse 12/28/2019  . Anxiety 12/28/2019  . Acute on chronic heart failure with preserved ejection fraction ([place]) 12/28/2019  . Abnormal MRI of abdomen   . Cholelithiasis   . Common bile duct stone 12/23/2019  . Acute cholecystitis 12/23/2019  . Polycythemia 09/11/2019  . Right knee pain 08/09/2019  . Quadriceps contusion 08/09/2019  . Abnormal cardiac CT angiography 05/11/2019  . Chest pain 04/11/2019  . Acute viral syndrome 04/11/2019  . Tobacco abuse 04/11/2019  . Hyperphosphatemia  04/11/2019  . SOB (shortness of breath) 04/07/2019  . Osteoporosis 01/03/2019  . Closed fracture of neck of femur ([place]) 10/17/2018  . Hip fracture ([place]) 10/01/2018  . Dysuria 05/20/2018  . Right leg swelling 05/20/2018  . CAP (community acquired pneumonia) 05/13/2018  . Tachycardia 05/13/2018  . Chest wall pain 05/13/2018  . Narcotic poisoning ([place]) 05/13/2018  . EtOH dependence ([place])   . Polypharmacy   . Esophagitis   . Preventative health care 12/22/2017  . Erythrocytosis 12/22/2017  . Depression 12/22/2017  . Hyperglycemia 12/22/2017  . Right sided sciatica 12/22/2017  . Cough 06/10/2017  . Family history of colon cancer - brother and father 03/10/2016  . Insomnia 02/11/2016  . Generalized anxiety disorder 11/25/2015  . Urinary frequency 04/25/2015  . Nausea and vomiting in adult 11/08/2014  . Spinal stenosis of lumbar region without neurogenic claudication 02/14/2014  . Cigarette smoker 08/15/2013  . Otitis media 10/27/2012  . Abdominal pain 03/30/2012  . HEPATIC CYST 02/18/2010  . Chronic pain 02/08/2010  . Vitamin B12 deficiency 10/04/2009  . GERD 01/04/2009  . ABDOMINAL PAIN-EPIGASTRIC 01/04/2009  . Depressive disorder 08/10/2008  . Venous (peripheral) insufficiency 08/10/2008  . Irritable bowel syndrome 08/10/2008  . Back pain 08/10/2008  . CIGARETTE SMOKER 03/06/2008  . Hx of adenomatous polyp of colon 12/06/2007  . BRONCHITIS, RECURRENT 12/06/2007  . DIVERTICULOSIS OF COLON 12/06/2007  . Other forms of scoliosis, lumbosacral region 12/06/2007  . CALCULUS, KIDNEY 10/07/2007  . VERTIGO 10/07/2007  . Headache(784.0) 10/07/2007    Past Surgical History:  Procedure Laterality Date  . APPENDECTOMY    . BACK SURGERY  10-09   Dr. Patrice Paradise  . BIOPSY  02/14/2020   Procedure: BIOPSY;  Surgeon: Gatha Mayer, MD;  Location: [facility];  Service: Endoscopy;;  . CARPAL TUNNEL RELEASE Right 08/14/2014   Procedure: RIGHT CARPAL TUNNEL RELEASE AND INJECT LEFT THUMB;   Surgeon: Daryll Brod, MD;  Location: [facility];  Service: Orthopedics;  Laterality: Right;  . CHOLECYSTECTOMY N/A 02/22/2020   Procedure: LAPAROSCOPIC CHOLECYSTECTOMY WITH INTRAOPERATIVE CHOLANGIOGRAM;  Surgeon: Kieth Brightly Arta Bruce, MD;  Location: [facility];  Service: General;  Laterality: N/A;  . COLONOSCOPY    . COLONOSCOPY WITH PROPOFOL N/A 02/14/2020   Procedure: COLONOSCOPY WITH PROPOFOL;  Surgeon: Gatha Mayer, MD;  Location: [facility];  Service: Endoscopy;  Laterality: N/A;  . ENDOSCOPIC RETROGRADE CHOLANGIOPANCREATOGRAPHY (ERCP) WITH PROPOFOL N/A 12/25/2019   Procedure: ENDOSCOPIC RETROGRADE CHOLANGIOPANCREATOGRAPHY (ERCP) WITH PROPOFOL;  Surgeon: Gatha Mayer, MD;  Location: [facility];  Service: Gastroenterology;  Laterality: N/A;  . ESOPHAGOGASTRODUODENOSCOPY (EGD) WITH PROPOFOL N/A 01/06/2020   Procedure: ESOPHAGOGASTRODUODENOSCOPY (EGD) WITH PROPOFOL;  Surgeon: Irene Shipper, MD;  Location: [facility] ENDOSCOPY;  Service: Endoscopy;  Laterality: N/A;  . HOT HEMOSTASIS N/A 01/06/2020  Procedure: HOT HEMOSTASIS (ARGON PLASMA COAGULATION/BICAP);  Surgeon: Irene Shipper, MD;  Location: [facility] ENDOSCOPY;  Service: Endoscopy;  Laterality: N/A;  . INTRAMEDULLARY (IM) NAIL INTERTROCHANTERIC Right 10/01/2018   Procedure: INTRAMEDULLARY (IM) NAIL INTERTROCHANTRIC;  Surgeon: Thornton Park, MD;  Location: [facility]S;  Service: Orthopedics;  Laterality: Right;  . LAPAROSCOPY N/A 02/21/2013   Procedure: LAPAROSCOPY OPERATIVE;  Surgeon: Margarette Asal, MD;  Location: [facility];  Service: Gynecology;  Laterality: N/A;  REQUESTING 5MM SCOPE WITH CAMERA  . LEFT HEART CATH AND CORONARY ANGIOGRAPHY N/A 05/11/2019   Procedure: LEFT HEART CATH AND CORONARY ANGIOGRAPHY;  Surgeon: Sherren Mocha, MD;  Location: [facility];  Service: Cardiovascular;  Laterality: N/A;  . REMOVAL OF STONES  12/25/2019   Procedure: Karlyn Agee;  Surgeon: Gatha Mayer, MD;  Location: [facility] ENDOSCOPY;  Service:  Gastroenterology;;  . Clide Deutscher  01/06/2020   Procedure: Clide Deutscher;  Surgeon: Irene Shipper, MD;  Location: [facility] ENDOSCOPY;  Service: Endoscopy;;  . Joan Mayans  12/25/2019   Procedure: SPHINCTEROTOMY;  Surgeon: Gatha Mayer, MD;  Location: [facility];  Service: Gastroenterology;;  . TONSILLECTOMY    . TUBAL LIGATION    . UPPER GASTROINTESTINAL ENDOSCOPY       OB History   No obstetric history on file.     Family History  Problem Relation Age of Onset  . Colon cancer Father   . Prostate cancer Father   . Heart disease Mother        prev MVR, also has DJD  . Colon cancer Brother   . Skin cancer Sister   . Alcohol abuse Daughter   . Bipolar disorder Daughter   . Drug abuse Daughter   . Esophageal cancer Neg Hx   . Rectal cancer Neg Hx   . Stomach cancer Neg Hx     Social History   Tobacco Use  . Smoking status: Current Every Day Smoker    Packs/day: 1.00    Years: 30.00    Pack years: 30.00    Types: Cigarettes  . Smokeless tobacco: Never Used  Vaping Use  . Vaping Use: Never used  Substance Use Topics  . Alcohol use: Yes    Alcohol/week: 3.0 standard drinks    Types: 3 Standard drinks or equivalent per week    Comment: occasional  . Drug use: No    Home Medications Prior to Admission medications   Medication Sig Start Date End Date Taking? Authorizing Provider  potassium chloride (KLOR-CON) 10 MEQ tablet Take 1 tablet (10 mEq total) by mouth daily. 05/17/21 06/16/21 Yes Bernhardt Riemenschneider, Carola Rhine, MD  azithromycin (ZITHROMAX) 250 MG tablet Take 1 tablet (250 mg total) by mouth daily. Take first 2 tablets together, then 1 every day until finished. 05/05/21   Corena Herter, PA-C  budesonide (ENTOCORT EC) 3 MG 24 hr capsule TAKE 3 CAPSULES ONCE DAILY. 05/13/21   Gatha Mayer, MD  buPROPion (WELLBUTRIN SR) 150 MG 12 hr tablet Take 1 tablet (150 mg total) by mouth daily. 04/23/21 10/20/21  Elvin So, MD  denosumab (PROLIA) 60 MG/ML SOSY injection Inject 60  mg into the skin every 6 (six) months.    [provider]  diclofenac Sodium (VOLTAREN) 1 % GEL Apply 2 g topically 4 (four) times daily as needed (for joint pain). 02/21/21   Elodia Florence., MD  hyoscyamine (ANASPAZ) 0.125 MG TBDP disintergrating tablet Place 1 tablet (0.125 mg total) under the tongue every 6 (six) hours as needed ([name]  cramps). 04/09/21   Gatha Mayer, MD  methocarbamol (ROBAXIN) 500 MG tablet Take 1 tablet (500 mg total) by mouth every 6 (six) hours as needed for muscle spasms. 02/17/21   Bonnielee Haff, MD  morphine (MS CONTIN) 15 MG 12 hr tablet Take 1 tablet (15 mg total) by mouth 3 (three) times daily. 02/17/21   Bonnielee Haff, MD  nicotine (NICODERM CQ - DOSED IN MG/24 HOURS) 21 mg/24hr patch Place 1 patch (21 mg total) onto the skin daily. 01/05/20   Swayze, Ava, DO  nitroGLYCERIN (NITROSTAT) 0.4 MG SL tablet Place 1 tablet (0.4 mg total) under the tongue every 5 (five) minutes as needed for chest pain. 04/22/20   Josue Hector, MD  ondansetron (ZOFRAN-ODT) 4 MG disintegrating tablet DISSOLVE 1 TABLET ON TONGUE EVERY 8 HOURS AS NEEDED FOR NAUSEA/VOMITING. 04/09/21   Gatha Mayer, MD  Oxycodone HCl 10 MG TABS Take 1 tablet (10 mg total) by mouth 4 (four) times daily as needed. 02/17/21   Bonnielee Haff, MD  pantoprazole (PROTONIX) 40 MG tablet TAKE (1) TABLET TWICE A DAY BEFORE MEALS. Patient taking differently: Take 40 mg by mouth 2 (two) times daily as needed (acid reflux). 01/29/21   Gatha Mayer, MD  rosuvastatin (CRESTOR) 5 MG tablet Take 1 tablet (5 mg total) by mouth 3 (three) times a week. 08/28/20   Josue Hector, MD  sertraline (ZOLOFT) 100 MG tablet Take 2 tablets (200 mg total) by mouth at bedtime. 04/23/21 10/20/21  Elvin So, MD  spironolactone (ALDACTONE) 25 MG tablet Take 0.5 tablets (12.5 mg total) by mouth daily. Patient taking differently: Take 12.5 mg by mouth daily as needed (swelling). 04/22/20   Josue Hector, MD  sucralfate  (CARAFATE) 1 g tablet Take 1 tablet (1 g total) by mouth 4 (four) times daily -  with meals and at bedtime for 14 days. 05/05/21 05/19/21  Corena Herter, PA-C  valACYclovir (VALTREX) 500 MG tablet Take 500 mg by mouth 2 (two) times daily as needed (flare ups).     [provider]    Allergies    Atorvastatin, Levofloxacin, Omnicef [cefdinir], Trazodone and nefazodone, Sulfa antibiotics, and Sulfonamide derivatives  Review of Systems   Review of Systems  Constitutional: Negative for chills and fever.  HENT: Negative for ear pain and sore throat.   Eyes: Negative for pain and visual disturbance.  Respiratory: Negative for cough and shortness of breath.   Cardiovascular: Negative for chest pain and palpitations.  Gastrointestinal: Positive for abdominal pain and nausea. Negative for vomiting.  Genitourinary: Negative for dysuria and hematuria.  Musculoskeletal: Positive for arthralgias, back pain and myalgias.  Skin: Negative for color change and rash.  Neurological: Negative for syncope and numbness.  All other systems reviewed and are negative.   Physical Exam Updated Vital Signs BP (!) 139/101   Pulse 78   Temp 97.9 F (36.6 C) (Oral)   Resp 14   Ht 5\' 3"  (1.6 m)   Wt 50.8 kg   SpO2 95%   BMI 19.84 kg/m   Physical Exam Constitutional:      General: She is not in acute distress. HENT:     Head: Normocephalic and atraumatic.  Eyes:     Conjunctiva/sclera: Conjunctivae normal.     Pupils: Pupils are equal, round, and reactive to light.  Cardiovascular:     Rate and Rhythm: Normal rate and regular rhythm.  Pulmonary:     Effort: Pulmonary effort is normal. No respiratory  distress.  Abdominal:     General: There is no distension.     Tenderness: There is no abdominal tenderness. There is no right CVA tenderness, left CVA tenderness, guarding or rebound. Negative signs include Murphy's sign and Rovsing's sign.  Skin:    General: Skin is warm and dry.   Neurological:     General: No focal deficit present.     Mental Status: She is alert. Mental status is at baseline.     Comments: Kyphosis and scoliosis of the spine, severe Pain reproducible with sitting upright, axial loading on head  Psychiatric:        Mood and Affect: Mood normal.        Behavior: Behavior normal.     ED Results / Procedures / Treatments   Labs (all labs ordered are listed, but only abnormal results are displayed) Labs Reviewed  COMPREHENSIVE METABOLIC PANEL - Abnormal; Notable for the following components:      Result Value   Potassium 3.1 (*)    Chloride 94 (*)    CO2 36 (*)    Glucose, Bld 100 (*)    BUN 7 (*)    Total Protein 6.1 (*)    Albumin 3.2 (*)    All other components within normal limits  CBC - Abnormal; Notable for the following components:   RBC 3.68 (*)    Hemoglobin 11.6 (*)    HCT 35.8 (*)    All other components within normal limits  TROPONIN I (HIGH SENSITIVITY) - Abnormal; Notable for the following components:   Troponin I (High Sensitivity) 41 (*)    All other components within normal limits  TROPONIN I (HIGH SENSITIVITY) - Abnormal; Notable for the following components:   Troponin I (High Sensitivity) 41 (*)    All other components within normal limits  LIPASE, BLOOD  URINALYSIS, ROUTINE W REFLEX MICROSCOPIC    EKG EKG Interpretation  Date/Time:  Saturday May 17 2021 00:47:11 EDT Ventricular Rate:  93 PR Interval:  144 QRS Duration: 92 QT Interval:  392 QTC Calculation: 487 R Axis:   83 Text Interpretation: Normal sinus rhythm Possible Left atrial enlargement Incomplete right bundle branch block Borderline ECG Confirmed by Octaviano Glow [phone]) on 05/17/2021 7:31:58 AM   Radiology CT Abdomen Pelvis Wo Contrast  Result Date: 05/17/2021 CLINICAL DATA:  Left upper quadrant abdominal pain.  Nausea. EXAM: CT ABDOMEN AND PELVIS WITHOUT CONTRAST TECHNIQUE: Multidetector CT imaging of the abdomen and pelvis was performed  following the standard protocol without IV contrast. COMPARISON:  Radiograph earlier today.  CT 06/11/2020 FINDINGS: Lower chest: Emphysema with occasional areas of right lower lobe mucous plugging. No focal airspace disease or pleural effusion. Normal heart size with coronary artery calcifications. Hepatobiliary: Tiny low-density lesion in the left hepatic lobe unchanged from prior exam. No new focal lesion. Clips in the gallbladder fossa postcholecystectomy. No biliary dilatation. Pancreas: No ductal dilatation or inflammation. Spleen: Normal in size without focal abnormality. Adrenals/Urinary Tract: Normal adrenal glands. No hydronephrosis or perinephric edema. Calcifications at the left renal hila appear vascular rather than nonobstructing stones. Suspected 4 mm nonobstructing stone in the upper right kidney. Decompressed ureters without ureteral calculi. Minimally distended urinary bladder. No bladder stone no bladder wall thickening. There is mild pelvic floor descent with cystocele. Stomach/Bowel: No bowel obstruction or inflammation. Small hiatal hernia. Moderate stool in the colon, primarily right colon. Low lying cecum in the pelvis. Appendix surgically absent per history. Mild colonic redundancy. No colonic wall thickening or pericolonic  edema. Vascular/Lymphatic: Advanced aortic and branch atherosclerosis. No aortic aneurysm. No bulky abdominopelvic adenopathy. Reproductive: Atrophic uterus, normal for age.  No adnexal mass. Other: No ascites, free air, or focal fluid collection. No abdominal wall hernia. Musculoskeletal: L4-S1 posterior fusion. L3-L4 degenerative disc disease. Surgical hardware in the right proximal femur. No acute osseous abnormalities are seen. IMPRESSION: 1. No acute abnormality or explanation for abdominal pain. 2. Suspected 4 mm nonobstructing stone in the upper right kidney. No hydronephrosis or obstructive uropathy. 3. Mild pelvic floor descent with cystocele. Aortic  Atherosclerosis (ICD10-I70.0) and Emphysema (ICD10-J43.9). Electronically Signed   By: Narda Rutherford M.D.   On: 05/17/2021 03:45   DG Abdomen Acute W/Chest  Result Date: 05/17/2021 CLINICAL DATA:  Epigastric pain and dysuria. EXAM: DG ABDOMEN ACUTE WITH 1 VIEW CHEST COMPARISON:  CT 06/11/2020 FINDINGS: Heart is normal in size. No acute airspace disease. No pulmonary edema or pleural effusion. No free intra-abdominal air. No bowel dilatation to suggest obstruction. Small volume of stool in the colon. Cholecystectomy clips in the right upper quadrant. No visualized radiopaque calculi. Surgical hardware in the lumbosacral spine. Surgical hardware in the right proximal femur. Bones appear under mineralized. IMPRESSION: Negative abdominal radiographs. No acute cardiopulmonary disease. Electronically Signed   By: Narda Rutherford M.D.   On: 05/17/2021 02:17    Procedures Procedures   Medications Ordered in ED Medications  alum & mag hydroxide-simeth (MAALOX/MYLANTA) 200-200-20 MG/5ML suspension 30 mL (30 mLs Oral Given 05/17/21 0903)    And  lidocaine (XYLOCAINE) 2 % viscous mouth solution 15 mL (15 mLs Oral Given 05/17/21 0903)  potassium chloride 10 mEq in 100 mL IVPB (0 mEq Intravenous Stopped 05/17/21 0926)  HYDROmorphone (DILAUDID) injection 2 mg (2 mg Intravenous Given 05/17/21 0916)  HYDROmorphone (DILAUDID) injection 1 mg (1 mg Intravenous Given 05/17/21 1236)    ED Course  I have reviewed the triage vital signs and the nursing notes.  Pertinent labs & imaging results that were available during my care of the patient were reviewed by me and considered in my medical decision making (see chart for details).  This patient complains of epigastric and back pain. This involves an extensive number of treatment options, and is a complaint that carries with it a high risk of complications and morbidity.  The differential diagnosis includes gastritis vs referred spinal pain vs ACS vs other  I  ordered, reviewed, and interpreted labs. Labs near baseline.  Trop 41 -> 41.  K 3.1 (improved from 2 weeks ago), but IV K ordered and patient recommended to start on PO K at home.  Lipase normal.  WBC normal.  UA unremarkable.  I ordered medication IV dilaudid, IV potassium for pain and hypoK, GI cocktail I ordered imaging studies which included CT abdomen pelvis I independently visualized and interpreted imaging which showed no life-threatening abnormalities, small hiatal hernia, and stool in the colon, and the monitor tracing which showed NSR  Previous records obtained and reviewed showing CT PE on 05/05/21 ordered for chest pain showing no acute PE, bronchial thickening, mild atelectasis, compression fx of T4 (mild), no aortic dissection or aneurysm  Based on her workup and clinical exam, I suspect her symptoms are most likely referred pain from her significant spinal disease.  I doubt pneumonia, aortic dissection, PE, or intraabdominal infection.  Doubt mesenteric ischemia.  She has no other GI symptoms, doubt SBO.  Likewise low suspicion for atypical ACS.  I believe she is clinically stable for discharge, advised she contact her pain  management clinic about her current medications.   Clinical Course as of 05/17/21 1706  Sat May 17, 2021  1101 Patient reports her abdominal pain is significantly improved after the Dilaudid and she is currently sleeping.  Her initial troponin 41, although it is chronically elevated, I have asked the nurse to recheck the troponin level. [MT]  12 Delta trop is flat [MT]  6967 Doubt ACS [MT]    Clinical Course User Index [MT] Langston Masker Carola Rhine, MD    Final Clinical Impression(s) / ED Diagnoses Final diagnoses:  Abdominal pain, unspecified abdominal location  Chronic midline thoracic back pain  Hypokalemia    Rx / DC Orders ED Discharge Orders         Ordered    potassium chloride (KLOR-CON) 10 MEQ tablet  Daily        05/17/21 1254            Wyvonnia Dusky, MD 05/17/21 1706

## 2021-05-22 ENCOUNTER — Encounter (HOSPITAL_COMMUNITY): Payer: Self-pay

## 2021-05-22 ENCOUNTER — Other Ambulatory Visit: Payer: Self-pay

## 2021-05-22 ENCOUNTER — Ambulatory Visit (HOSPITAL_COMMUNITY)
Admission: EM | Admit: 2021-05-22 | Discharge: 2021-05-22 | Disposition: A | Discharge status: Home / Self Care | Payer: Medicare HMO | Attending: Emergency Medicine | Admitting: Emergency Medicine

## 2021-05-22 DIAGNOSIS — K5900 Constipation, unspecified: Secondary | ICD-10-CM | POA: Diagnosis not present

## 2021-05-22 DIAGNOSIS — R1012 Left upper quadrant pain: Secondary | ICD-10-CM | POA: Diagnosis not present

## 2021-05-22 MED ORDER — MINERAL OIL RE ENEM: 1.0000 | ENEMA | Freq: Once | RECTAL | 0 refills | Status: AC

## 2021-05-22 MED ORDER — POLYETHYLENE GLYCOL 3350 17 G PO PACK: 17.0000 g | PACK | Freq: Two times a day (BID) | ORAL | 0 refills | Status: DC

## 2021-05-22 NOTE — ED Triage Notes (Signed)
Pt c/o abdominal spasms x 3 weeks. She denies nausea and vomiting. Pt states she has not had a bowel movement in 3 days.

## 2021-05-22 NOTE — Discharge Instructions (Addendum)
Can use miralax ice daily, place packet into water or juice, use until bowel movement has occurred  Can use enema once to help achieve relief, instructions on how to compete in you packet  It has been recommended that you be reevaluated at the emergency department  NOW due to severity of pain, you have chosen to attempt bowel movement first, if bowel movement occurs and you still have pain it is recommended that you go to the hospital for evaluation, at any point if pain worsens, fever, chills, nausea, vomiting begin please go to emergency department for evaluation

## 2021-05-22 NOTE — ED Provider Notes (Signed)
[place]    CSN: [id number] Arrival date & time: 05/22/21  1402      History   Chief Complaint Chief Complaint  Patient presents with  . Abdominal Pain  . Chest Pain    rib    HPI Denise King is a 70 y.o. female.   Patient presents with left upper quadrant abdominal pain with spasms beginning 3 weeks ago. Difficulty describing how pain feels, per husband has described as being felt all over. Denies nausea, vomiting, fever, chills. History of Chron's, typically has diarrhea, Last bm 3 days ago. Taking senna, unsure of frequency, without relief, started within the last week. Taking oxycodone and MS contin for chronic back pain, followed by pain clinic. Narcotics are not relieving abdominal pain at all. Seen in ED twice for abdominal pain, last visit 05/17/21. Abdominal x-ray and CT scan negative for acute process.   Past Medical History:  Diagnosis Date  . Acute cystitis   . Acute respiratory failure ([place])   . Allergy    as a child grew out of them  . Anemia    pernicious anemia  . Anxiety   . Arthritis   . Back pain   . Blood transfusion   . CAP (community acquired pneumonia)   . CHF (congestive heart failure) ([place])   . Chronic pain syndrome   . Common bile duct stone   . Depression   . Diverticulosis of colon   . Duodenal ulcer hemorrhagic-after biliary sphincterotomy   . Dysthymia   . Esophagitis   . EtOH dependence ([place])   . Gastritis   . GERD (gastroesophageal reflux disease)   . H/O chest pain Dec. 2013   no work up done  . Hx of colonic polyps   . Hypertension   . Irritable bowel syndrome   . Lymphocytic colitis 02/16/2020  . Osteopenia   . Osteoporosis   . Pneumonia 2019  . Polypharmacy   . Reflux esophagitis   . Right sided sciatica 12/22/2017  . Tobacco use disorder   . Unspecified chronic bronchitis ([place])   . Vertigo   . Vitamin B12 deficiency   . Wears glasses     Patient Active Problem List   Diagnosis Date Noted  .  Aortic atherosclerosis ([place]) 03/13/2021  . Genital herpes simplex 03/12/2021  . Hyperlipidemia 03/12/2021  . Severe sepsis ([place]) 02/10/2021  . Acute cystitis 02/10/2021  . Left hip pain 02/10/2021  . Acute metabolic encephalopathy 60/63/0160  . Toe fracture, right 02/01/2021  . Concussion 02/01/2021  . Floaters, bilateral 01/31/2021  . Acute on chronic respiratory failure with hypoxia and hypercapnia ([place]) 07/10/2020  . Acute on chronic heart failure ([place]) 07/04/2020  . Choledocholithiasis 02/22/2020  . Lymphocytic colitis 02/16/2020  . Duodenal ulcer hemorrhagic   . Malnutrition of moderate degree 01/01/2020  . Acute respiratory failure with hypoxia and hypercapnia ([place]) 12/28/2019  . Bipolar disorder ([place]) 12/28/2019  . Alcohol abuse 12/28/2019  . Anxiety 12/28/2019  . Acute on chronic heart failure with preserved ejection fraction ([place]) 12/28/2019  . Abnormal MRI of abdomen   . Cholelithiasis   . Common bile duct stone 12/23/2019  . Acute cholecystitis 12/23/2019  . Polycythemia 09/11/2019  . Right knee pain 08/09/2019  . Quadriceps contusion 08/09/2019  . Abnormal cardiac CT angiography 05/11/2019  . Chest pain 04/11/2019  . Acute viral syndrome 04/11/2019  . Tobacco abuse 04/11/2019  . Hyperphosphatemia 04/11/2019  . SOB (shortness of breath) 04/07/2019  . Osteoporosis 01/03/2019  .  Closed fracture of neck of femur ([place]) 10/17/2018  . Hip fracture ([place]) 10/01/2018  . Dysuria 05/20/2018  . Right leg swelling 05/20/2018  . CAP (community acquired pneumonia) 05/13/2018  . Tachycardia 05/13/2018  . Chest wall pain 05/13/2018  . Narcotic poisoning ([place]) 05/13/2018  . EtOH dependence ([place])   . Polypharmacy   . Esophagitis   . Preventative health care 12/22/2017  . Erythrocytosis 12/22/2017  . Depression 12/22/2017  . Hyperglycemia 12/22/2017  . Right sided sciatica 12/22/2017  . Cough 06/10/2017  . Family history of colon cancer - brother and father 03/10/2016  .  Insomnia 02/11/2016  . Generalized anxiety disorder 11/25/2015  . Urinary frequency 04/25/2015  . Nausea and vomiting in adult 11/08/2014  . Spinal stenosis of lumbar region without neurogenic claudication 02/14/2014  . Cigarette smoker 08/15/2013  . Otitis media 10/27/2012  . Abdominal pain 03/30/2012  . HEPATIC CYST 02/18/2010  . Chronic pain 02/08/2010  . Vitamin B12 deficiency 10/04/2009  . GERD 01/04/2009  . ABDOMINAL PAIN-EPIGASTRIC 01/04/2009  . Depressive disorder 08/10/2008  . Venous (peripheral) insufficiency 08/10/2008  . Irritable bowel syndrome 08/10/2008  . Back pain 08/10/2008  . CIGARETTE SMOKER 03/06/2008  . Hx of adenomatous polyp of colon 12/06/2007  . BRONCHITIS, RECURRENT 12/06/2007  . DIVERTICULOSIS OF COLON 12/06/2007  . Other forms of scoliosis, lumbosacral region 12/06/2007  . CALCULUS, KIDNEY 10/07/2007  . VERTIGO 10/07/2007  . Headache(784.0) 10/07/2007    Past Surgical History:  Procedure Laterality Date  . APPENDECTOMY    . BACK SURGERY  10-09   Dr. Patrice [place]  . BIOPSY  02/14/2020   Procedure: BIOPSY;  Surgeon: Gatha Mayer, MD;  Location: [facility];  Service: Endoscopy;;  . CARPAL TUNNEL RELEASE Right 08/14/2014   Procedure: RIGHT CARPAL TUNNEL RELEASE AND INJECT LEFT THUMB;  Surgeon: Daryll Brod, MD;  Location: [facility];  Service: Orthopedics;  Laterality: Right;  . CHOLECYSTECTOMY N/A 02/22/2020   Procedure: LAPAROSCOPIC CHOLECYSTECTOMY WITH INTRAOPERATIVE CHOLANGIOGRAM;  Surgeon: Kieth Brightly Arta Bruce, MD;  Location: [facility];  Service: General;  Laterality: N/A;  . COLONOSCOPY    . COLONOSCOPY WITH PROPOFOL N/A 02/14/2020   Procedure: COLONOSCOPY WITH PROPOFOL;  Surgeon: Gatha Mayer, MD;  Location: [facility];  Service: Endoscopy;  Laterality: N/A;  . ENDOSCOPIC RETROGRADE CHOLANGIOPANCREATOGRAPHY (ERCP) WITH PROPOFOL N/A 12/25/2019   Procedure: ENDOSCOPIC RETROGRADE CHOLANGIOPANCREATOGRAPHY (ERCP) WITH PROPOFOL;  Surgeon:  Gatha Mayer, MD;  Location: [facility];  Service: Gastroenterology;  Laterality: N/A;  . ESOPHAGOGASTRODUODENOSCOPY (EGD) WITH PROPOFOL N/A 01/06/2020   Procedure: ESOPHAGOGASTRODUODENOSCOPY (EGD) WITH PROPOFOL;  Surgeon: Irene Shipper, MD;  Location: [facility] END[facility]COPY;  Service: Endoscopy;  Laterality: N/A;  . HOT HEMOSTASIS N/A 01/06/2020   Procedure: HOT HEMOSTASIS (ARGON PLASMA COAGULATION/BICAP);  Surgeon: Irene Shipper, MD;  Location: [facility] END[facility]COPY;  Service: Endoscopy;  Laterality: N/A;  . INTRAMEDULLARY (IM) NAIL INTERTROCHANTERIC Right 10/01/2018   Procedure: INTRAMEDULLARY (IM) NAIL INTERTROCHANTRIC;  Surgeon: Thornton Park, MD;  Location: [facility]S;  Service: Orthopedics;  Laterality: Right;  . LAPAROSCOPY N/A 02/21/2013   Procedure: LAPAROSCOPY OPERATIVE;  Surgeon: Margarette Asal, MD;  Location: [facility];  Service: Gynecology;  Laterality: N/A;  REQUESTING 5MM SCOPE WITH CAMERA  . LEFT HEART CATH AND CORONARY ANGIOGRAPHY N/A 05/11/2019   Procedure: LEFT HEART CATH AND CORONARY ANGIOGRAPHY;  Surgeon: Sherren Mocha, MD;  Location: [facility];  Service: Cardiovascular;  Laterality: N/A;  . REMOVAL OF STONES  12/25/2019   Procedure: Karlyn Agee;  Surgeon: Carlean Purl,  Ofilia Neas, MD;  Location[facility]COPY;  Service: Gastroenterology;;  . Clide Deutscher  01/06/2020   Procedure: Clide Deutscher;  Surgeon: Irene Shipper, MD;  Location: [facility] ENDOSCOPY;  Service: Endoscopy;;  . Joan Mayans  12/25/2019   Procedure: Joan Mayans;  Surgeon: Gatha Mayer, MD;  Location: [facility];  Service: Gastroenterology;;  . TONSILLECTOMY    . TUBAL LIGATION    . UPPER GASTROINTESTINAL ENDOSCOPY      OB History   No obstetric history on file.      Home Medications    Prior to Admission medications   Medication Sig Start Date End Date Taking? Authorizing Provider  mineral oil enema Place 133 mLs (1 enema total) rectally once for 1 dose. 05/22/21 05/22/21 Yes Elasha Tess R, NP  polyethylene  glycol (MIRALAX) 17 g packet Take 17 g by mouth 2 (two) times daily. 05/22/21  Yes Eros Montour R, NP  azithromycin (ZITHROMAX) 250 MG tablet Take 1 tablet (250 mg total) by mouth daily. Take first 2 tablets together, then 1 every day until finished. 05/05/21   Corena Herter, PA-C  budesonide (ENTOCORT EC) 3 MG 24 hr capsule TAKE 3 CAPSULES ONCE DAILY. 05/13/21   Gatha Mayer, MD  buPROPion (WELLBUTRIN SR) 150 MG 12 hr tablet Take 1 tablet (150 mg total) by mouth daily. 04/23/21 10/20/21  Elvin So, MD  denosumab (PROLIA) 60 MG/ML SOSY injection Inject 60 mg into the skin every 6 (six) months.    [provider]  diclofenac Sodium (VOLTAREN) 1 % GEL Apply 2 g topically 4 (four) times daily as needed (for joint pain). 02/21/21   Elodia Florence., MD  hyoscyamine (ANASPAZ) 0.125 MG TBDP disintergrating tablet Place 1 tablet (0.125 mg total) under the tongue every 6 (six) hours as needed ([name] cramps). 04/09/21   Gatha Mayer, MD  methocarbamol (ROBAXIN) 500 MG tablet Take 1 tablet (500 mg total) by mouth every 6 (six) hours as needed for muscle spasms. 02/17/21   Bonnielee Haff, MD  morphine (MS CONTIN) 15 MG 12 hr tablet Take 1 tablet (15 mg total) by mouth 3 (three) times daily. 02/17/21   Bonnielee Haff, MD  nicotine (NICODERM CQ - DOSED IN MG/24 HOURS) 21 mg/24hr patch Place 1 patch (21 mg total) onto the skin daily. 01/05/20   Swayze, Ava, DO  nitroGLYCERIN (NITROSTAT) 0.4 MG SL tablet Place 1 tablet (0.4 mg total) under the tongue every 5 (five) minutes as needed for chest pain. 04/22/20   Josue Hector, MD  ondansetron (ZOFRAN-ODT) 4 MG disintegrating tablet DISSOLVE 1 TABLET ON TONGUE EVERY 8 HOURS AS NEEDED FOR NAUSEA/VOMITING. 04/09/21   Gatha Mayer, MD  Oxycodone HCl 10 MG TABS Take 1 tablet (10 mg total) by mouth 4 (four) times daily as needed. 02/17/21   Bonnielee Haff, MD  pantoprazole (PROTONIX) 40 MG tablet TAKE (1) TABLET TWICE A DAY BEFORE MEALS. Patient  taking differently: Take 40 mg by mouth 2 (two) times daily as needed (acid reflux). 01/29/21   Gatha Mayer, MD  potassium chloride (KLOR-CON) 10 MEQ tablet Take 1 tablet (10 mEq total) by mouth daily. 05/17/21 06/16/21  Wyvonnia Dusky, MD  rosuvastatin (CRESTOR) 5 MG tablet Take 1 tablet (5 mg total) by mouth 3 (three) times a week. 08/28/20   Josue Hector, MD  sertraline (ZOLOFT) 100 MG tablet Take 2 tablets (200 mg total) by mouth at bedtime. 04/23/21 10/20/21  Elvin So, MD  spironolactone (ALDACTONE) 25 MG tablet Take  0.5 tablets (12.5 mg total) by mouth daily. Patient taking differently: Take 12.5 mg by mouth daily as needed (swelling). 04/22/20   Josue Hector, MD  sucralfate (CARAFATE) 1 g tablet Take 1 tablet (1 g total) by mouth 4 (four) times daily -  with meals and at bedtime for 14 days. 05/05/21 05/19/21  Corena Herter, PA-C  valACYclovir (VALTREX) 500 MG tablet Take 500 mg by mouth 2 (two) times daily as needed (flare ups).     [provider]    Family History Family History  Problem Relation Age of Onset  . Colon cancer Father   . Prostate cancer Father   . Heart disease Mother        prev MVR, also has DJD  . Colon cancer Brother   . Skin cancer Sister   . Alcohol abuse Daughter   . Bipolar disorder Daughter   . Drug abuse Daughter   . Esophageal cancer Neg Hx   . Rectal cancer Neg Hx   . Stomach cancer Neg Hx     Social History Social History   Tobacco Use  . Smoking status: Current Every Day Smoker    Packs/day: 1.00    Years: 30.00    Pack years: 30.00    Types: Cigarettes  . Smokeless tobacco: Never Used  Vaping Use  . Vaping Use: Never used  Substance Use Topics  . Alcohol use: Yes    Alcohol/week: 3.0 standard drinks    Types: 3 Standard drinks or equivalent per week    Comment: occasional  . Drug use: No     Allergies   Atorvastatin, Levofloxacin, Omnicef [cefdinir], Trazodone and nefazodone, Sulfa antibiotics, and  Sulfonamide derivatives   Review of Systems Review of Systems  Constitutional: Negative.   HENT: Negative.   Respiratory: Negative.   Cardiovascular: Negative.   Gastrointestinal: Positive for abdominal pain. Negative for abdominal distention, anal bleeding, blood in stool, constipation, diarrhea, nausea, rectal pain and vomiting.  Skin: Negative.   Neurological: Negative.      Physical Exam Triage Vital Signs ED Triage Vitals  Enc Vitals Group     BP 05/22/21 1455 117/65     Pulse Rate 05/22/21 1455 93     Resp 05/22/21 1455 15     Temp 05/22/21 1455 98.4 F (36.9 C)     Temp Source 05/22/21 1455 Oral     SpO2 05/22/21 1455 95 %     Weight --      Height --      Head Circumference --      Peak Flow --      Pain Score 05/22/21 1454 8     Pain Loc --      Pain Edu? --      Excl. in [place]? --    No data found.  Updated Vital Signs BP 117/65 (BP Location: Left Arm)   Pulse 93   Temp 98.4 F (36.9 C) (Oral)   Resp 15   SpO2 95%   Visual Acuity Right Eye Distance:   Left Eye Distance:   Bilateral Distance:    Right Eye Near:   Left Eye Near:    Bilateral Near:     Physical Exam Constitutional:      General: She is in acute distress.     Appearance: She is well-developed and normal weight.  HENT:     Head: Normocephalic.  Eyes:     Extraocular Movements: Extraocular movements intact.  Abdominal:  General: Abdomen is flat. Bowel sounds are normal.     Palpations: Abdomen is soft.     Tenderness: There is abdominal tenderness in the epigastric area and left upper quadrant. There is guarding. There is no right CVA tenderness, left CVA tenderness or rebound. Negative signs include Murphy's sign.  Musculoskeletal:        General: Normal range of motion.  Skin:    General: Skin is warm and dry.  Neurological:     General: No focal deficit present.     Mental Status: She is alert and oriented to person, place, and time.  Psychiatric:        Mood and  Affect: Mood normal.        Behavior: Behavior normal.        Thought Content: Thought content normal.        Judgment: Judgment normal.      UC Treatments / Results  Labs (all labs ordered are listed, but only abnormal results are displayed) Labs Reviewed - No data to display  EKG   Radiology No results found.  Procedures Procedures (including critical care time)  Medications Ordered in UC Medications - No data to display  Initial Impression / Assessment and Plan / UC Course  I have reviewed the triage vital signs and the nursing notes.  Pertinent labs & imaging results that were available during my care of the patient were reviewed by me and considered in my medical decision making (see chart for details).  LUQ pain Constipation  Due to severity of abdominal pain, patient was instructed to go to emergency department for evaluation. Refused, only wanting prescription medication. Discussed that constipation may not be cause of pain, that medication can be attempted but if pain persist that patient needs to go to the emergency department. Patient and husband verbalized understanding.   1. Miralax 17g bid until bowel movement occurs 2. Mineral oil enema once prn Final Clinical Impressions(s) / UC Diagnoses   Final diagnoses:  None     Discharge Instructions     Can use miralax ice daily, place packet into water or juice, use until bowel movement has occurred  Can use enema once to help achieve relief, instructions on how to compete in you packet  It has been recommended that you be reevaluated at the emergency department  NOW due to severity of pain, you have chosen to attempt bowel movement first, if bowel movement occurs and you still have pain it is recommended that you go to the hospital for evaluation, at any point if pain worsens, fever, chills, nausea, vomiting begin please go to emergency department for evaluation   ED Prescriptions    Medication Sig  Dispense Auth. Provider   polyethylene glycol (MIRALAX) 17 g packet Take 17 g by mouth 2 (two) times daily. 14 each Krystelle Prashad R, NP   mineral oil enema Place 133 mLs (1 enema total) rectally once for 1 dose. 133 mL Claudine Stallings, Leitha Schuller, NP     PDMP not reviewed this encounter.   Hans Eden, NP 05/22/21 1553

## 2021-05-23 ENCOUNTER — Ambulatory Visit: Payer: Medicare HMO | Admitting: Internal Medicine

## 2021-05-24 ENCOUNTER — Emergency Department (HOSPITAL_COMMUNITY)
Admission: EM | Admit: 2021-05-24 | Discharge: 2021-05-24 | Disposition: A | Discharge status: Home / Self Care | Payer: Medicare HMO | Attending: Emergency Medicine | Admitting: Emergency Medicine

## 2021-05-24 ENCOUNTER — Emergency Department (HOSPITAL_COMMUNITY): Payer: Medicare HMO

## 2021-05-24 ENCOUNTER — Other Ambulatory Visit: Payer: Self-pay

## 2021-05-24 ENCOUNTER — Encounter (HOSPITAL_COMMUNITY): Payer: Self-pay | Admitting: Emergency Medicine

## 2021-05-24 DIAGNOSIS — N200 Calculus of kidney: Secondary | ICD-10-CM | POA: Diagnosis not present

## 2021-05-24 DIAGNOSIS — K5900 Constipation, unspecified: Secondary | ICD-10-CM

## 2021-05-24 DIAGNOSIS — X58XXXA Exposure to other specified factors, initial encounter: Secondary | ICD-10-CM | POA: Insufficient documentation

## 2021-05-24 DIAGNOSIS — S22040A Wedge compression fracture of fourth thoracic vertebra, initial encounter for closed fracture: Secondary | ICD-10-CM | POA: Diagnosis not present

## 2021-05-24 DIAGNOSIS — I110 Hypertensive heart disease with heart failure: Secondary | ICD-10-CM | POA: Insufficient documentation

## 2021-05-24 DIAGNOSIS — M545 Low back pain, unspecified: Secondary | ICD-10-CM | POA: Diagnosis not present

## 2021-05-24 DIAGNOSIS — S3992XA Unspecified injury of lower back, initial encounter: Secondary | ICD-10-CM | POA: Diagnosis not present

## 2021-05-24 DIAGNOSIS — I509 Heart failure, unspecified: Secondary | ICD-10-CM | POA: Diagnosis not present

## 2021-05-24 DIAGNOSIS — S22040S Wedge compression fracture of fourth thoracic vertebra, sequela: Secondary | ICD-10-CM

## 2021-05-24 DIAGNOSIS — R1084 Generalized abdominal pain: Secondary | ICD-10-CM | POA: Insufficient documentation

## 2021-05-24 DIAGNOSIS — M549 Dorsalgia, unspecified: Secondary | ICD-10-CM

## 2021-05-24 DIAGNOSIS — F17210 Nicotine dependence, cigarettes, uncomplicated: Secondary | ICD-10-CM | POA: Diagnosis not present

## 2021-05-24 DIAGNOSIS — R69 Illness, unspecified: Secondary | ICD-10-CM | POA: Diagnosis not present

## 2021-05-24 DIAGNOSIS — K7689 Other specified diseases of liver: Secondary | ICD-10-CM | POA: Diagnosis not present

## 2021-05-24 DIAGNOSIS — S22060A Wedge compression fracture of T7-T8 vertebra, initial encounter for closed fracture: Secondary | ICD-10-CM | POA: Diagnosis not present

## 2021-05-24 DIAGNOSIS — K219 Gastro-esophageal reflux disease without esophagitis: Secondary | ICD-10-CM | POA: Diagnosis not present

## 2021-05-24 DIAGNOSIS — M546 Pain in thoracic spine: Secondary | ICD-10-CM | POA: Diagnosis not present

## 2021-05-24 DIAGNOSIS — K449 Diaphragmatic hernia without obstruction or gangrene: Secondary | ICD-10-CM | POA: Diagnosis not present

## 2021-05-24 DIAGNOSIS — R0902 Hypoxemia: Secondary | ICD-10-CM | POA: Diagnosis not present

## 2021-05-24 DIAGNOSIS — I10 Essential (primary) hypertension: Secondary | ICD-10-CM | POA: Diagnosis not present

## 2021-05-24 LAB — COMPREHENSIVE METABOLIC PANEL
ALT: 9 U/L (ref 0–44)
AST: 16 U/L (ref 15–41)
Albumin: 3.4 g/dL — ABNORMAL LOW (ref 3.5–5.0)
Alkaline Phosphatase: 76 U/L (ref 38–126)
Anion gap: 10 (ref 5–15)
BUN: 7 mg/dL — ABNORMAL LOW (ref 8–23)
CO2: 30 mmol/L (ref 22–32)
Calcium: 8.6 mg/dL — ABNORMAL LOW (ref 8.9–10.3)
Chloride: 97 mmol/L — ABNORMAL LOW (ref 98–111)
Creatinine, Ser: 0.78 mg/dL (ref 0.44–1.00)
GFR, Estimated: >60 mL/min (ref >60)
Glucose, Bld: 93 mg/dL (ref 70–99)
Potassium: 3.4 mmol/L — ABNORMAL LOW (ref 3.5–5.1)
Sodium: 137 mmol/L (ref 135–145)
Total Bilirubin: 0.5 mg/dL (ref 0.3–1.2)
Total Protein: 6.4 g/dL — ABNORMAL LOW (ref 6.5–8.1)

## 2021-05-24 LAB — CBC
HCT: 36.1 % (ref 36.0–46.0)
Hemoglobin: 11.6 g/dL — ABNORMAL LOW (ref 12.0–15.0)
MCH: 31.7 pg (ref 26.0–34.0)
MCHC: 32.1 g/dL (ref 30.0–36.0)
MCV: 98.6 fL (ref 80.0–100.0)
Platelets: 309 10*3/uL (ref 150–400)
RBC: 3.66 MIL/uL — ABNORMAL LOW (ref 3.87–5.11)
RDW: 13.8 % (ref 11.5–15.5)
WBC: 9.1 10*3/uL (ref 4.0–10.5)
nRBC: 0 % (ref 0.0–0.2)

## 2021-05-24 LAB — URINALYSIS, ROUTINE W REFLEX MICROSCOPIC
Bilirubin Urine: NEGATIVE
Glucose, UA: NEGATIVE mg/dL
Hgb urine dipstick: NEGATIVE
Ketones, ur: NEGATIVE mg/dL
Leukocytes,Ua: NEGATIVE
Nitrite: NEGATIVE
Protein, ur: NEGATIVE mg/dL
Specific Gravity, Urine: 1.009 (ref 1.005–1.030)
pH: 5 (ref 5.0–8.0)

## 2021-05-24 LAB — TROPONIN I (HIGH SENSITIVITY)
Troponin I (High Sensitivity): 92 ng/L — ABNORMAL HIGH (ref <18)
Troponin I (High Sensitivity): 80 ng/L — ABNORMAL HIGH (ref <18)

## 2021-05-24 LAB — LACTIC ACID, PLASMA: Lactic Acid, Venous: 0.7 mmol/L (ref 0.5–1.9)

## 2021-05-24 LAB — LIPASE, BLOOD: Lipase: 26 U/L (ref 11–51)

## 2021-05-24 MED ORDER — HYDROMORPHONE HCL 1 MG/ML IJ SOLN
1.0000 mg | Freq: Once | INTRAMUSCULAR | Status: AC
  Administered 2021-05-24: 1 mg via INTRAVENOUS
  Filled 2021-05-24: qty 1

## 2021-05-24 MED ORDER — IOHEXOL 9 MG/ML PO SOLN
ORAL | Status: AC
  Administered 2021-05-24: 500 mL
  Filled 2021-05-24: qty 1000

## 2021-05-24 MED ORDER — ONDANSETRON HCL 4 MG/2ML IJ SOLN
4.0000 mg | Freq: Once | INTRAMUSCULAR | Status: AC
  Administered 2021-05-24: 4 mg via INTRAVENOUS
  Filled 2021-05-24: qty 2

## 2021-05-24 NOTE — ED Provider Notes (Signed)
[facility] EMERGENCY DEPARTMENT Provider Note   CSN: [id number] Arrival date & time: 05/24/21  4854     History Chief Complaint  Patient presents with  . Constipated / Abdominal Pain     Denise King is a 70 y.o. female.  The history is provided by the patient and medical records. No language interpreter was used.  Abdominal Pain Pain location:  Generalized Pain quality: aching   Pain radiates to:  Back Pain severity:  Severe Onset quality:  Gradual Timing:  Constant Progression:  Worsening Relieved by:  Nothing Worsened by:  Nothing Ineffective treatments:  None tried Associated symptoms: constipation   Associated symptoms: no chest pain, no chills, no cough, no diarrhea, no dysuria, no fatigue, no fever, no nausea, no shortness of breath and no vomiting        Past Medical History:  Diagnosis Date  . Acute cystitis   . Acute respiratory failure ([place])   . Allergy    as a child grew out of them  . Anemia    pernicious anemia  . Anxiety   . Arthritis   . Back pain   . Blood transfusion   . CAP (community acquired pneumonia)   . CHF (congestive heart failure) ([place])   . Chronic pain syndrome   . Common bile duct stone   . Depression   . Diverticulosis of colon   . Duodenal ulcer hemorrhagic-after biliary sphincterotomy   . Dysthymia   . Esophagitis   . EtOH dependence ([place])   . Gastritis   . GERD (gastroesophageal reflux disease)   . H/O chest pain Dec. 2013   no work up done  . Hx of colonic polyps   . Hypertension   . Irritable bowel syndrome   . Lymphocytic colitis 02/16/2020  . Osteopenia   . Osteoporosis   . Pneumonia 2019  . Polypharmacy   . Reflux esophagitis   . Right sided sciatica 12/22/2017  . Tobacco use disorder   . Unspecified chronic bronchitis ([place])   . Vertigo   . Vitamin B12 deficiency   . Wears glasses     Patient Active Problem List   Diagnosis Date Noted  . Aortic atherosclerosis ([place]) 03/13/2021   . Genital herpes simplex 03/12/2021  . Hyperlipidemia 03/12/2021  . Severe sepsis ([place]) 02/10/2021  . Acute cystitis 02/10/2021  . Left hip pain 02/10/2021  . Acute metabolic encephalopathy 62/70/3500  . Toe fracture, right 02/01/2021  . Concussion 02/01/2021  . Floaters, bilateral 01/31/2021  . Acute on chronic respiratory failure with hypoxia and hypercapnia ([place]) 07/10/2020  . Acute on chronic heart failure ([place]) 07/04/2020  . Choledocholithiasis 02/22/2020  . Lymphocytic colitis 02/16/2020  . Duodenal ulcer hemorrhagic   . Malnutrition of moderate degree 01/01/2020  . Acute respiratory failure with hypoxia and hypercapnia ([place]) 12/28/2019  . Bipolar disorder ([place]) 12/28/2019  . Alcohol abuse 12/28/2019  . Anxiety 12/28/2019  . Acute on chronic heart failure with preserved ejection fraction ([place]) 12/28/2019  . Abnormal MRI of abdomen   . Cholelithiasis   . Common bile duct stone 12/23/2019  . Acute cholecystitis 12/23/2019  . Polycythemia 09/11/2019  . Right knee pain 08/09/2019  . Quadriceps contusion 08/09/2019  . Abnormal cardiac CT angiography 05/11/2019  . Chest pain 04/11/2019  . Acute viral syndrome 04/11/2019  . Tobacco abuse 04/11/2019  . Hyperphosphatemia 04/11/2019  . SOB (shortness of breath) 04/07/2019  . Osteoporosis 01/03/2019  . Closed fracture of neck of femur ([place]) 10/17/2018  .  Hip fracture ([place]) 10/01/2018  . Dysuria 05/20/2018  . Right leg swelling 05/20/2018  . CAP (community acquired pneumonia) 05/13/2018  . Tachycardia 05/13/2018  . Chest wall pain 05/13/2018  . Narcotic poisoning ([place]) 05/13/2018  . EtOH dependence ([place])   . Polypharmacy   . Esophagitis   . Preventative health care 12/22/2017  . Erythrocytosis 12/22/2017  . Depression 12/22/2017  . Hyperglycemia 12/22/2017  . Right sided sciatica 12/22/2017  . Cough 06/10/2017  . Family history of colon cancer - brother and father 03/10/2016  . Insomnia 02/11/2016  . Generalized  anxiety disorder 11/25/2015  . Urinary frequency 04/25/2015  . Nausea and vomiting in adult 11/08/2014  . Spinal stenosis of lumbar region without neurogenic claudication 02/14/2014  . Cigarette smoker 08/15/2013  . Otitis media 10/27/2012  . Abdominal pain 03/30/2012  . HEPATIC CYST 02/18/2010  . Chronic pain 02/08/2010  . Vitamin B12 deficiency 10/04/2009  . GERD 01/04/2009  . ABDOMINAL PAIN-EPIGASTRIC 01/04/2009  . Depressive disorder 08/10/2008  . Venous (peripheral) insufficiency 08/10/2008  . Irritable bowel syndrome 08/10/2008  . Back pain 08/10/2008  . CIGARETTE SMOKER 03/06/2008  . Hx of adenomatous polyp of colon 12/06/2007  . BRONCHITIS, RECURRENT 12/06/2007  . DIVERTICULOSIS OF COLON 12/06/2007  . Other forms of scoliosis, lumbosacral region 12/06/2007  . CALCULUS, KIDNEY 10/07/2007  . VERTIGO 10/07/2007  . Headache(784.0) 10/07/2007    Past Surgical History:  Procedure Laterality Date  . APPENDECTOMY    . BACK SURGERY  10-09   Dr. Patrice Paradise  . BIOPSY  02/14/2020   Procedure: BIOPSY;  Surgeon: Gatha Mayer, MD;  Location: [facility];  Service: Endoscopy;;  . CARPAL TUNNEL RELEASE Right 08/14/2014   Procedure: RIGHT CARPAL TUNNEL RELEASE AND INJECT LEFT THUMB;  Surgeon: Daryll Brod, MD;  Location: [place];  Service: Orthopedics;  Laterality: Right;  . CHOLECYSTECTOMY N/A 02/22/2020   Procedure: LAPAROSCOPIC CHOLECYSTECTOMY WITH INTRAOPERATIVE CHOLANGIOGRAM;  Surgeon: Kieth Brightly Arta Bruce, MD;  Location: [facility];  Service: General;  Laterality: N/A;  . COLONOSCOPY    . COLONOSCOPY WITH PROPOFOL N/A 02/14/2020   Procedure: COLONOSCOPY WITH PROPOFOL;  Surgeon: Gatha Mayer, MD;  Location: [facility];  Service: Endoscopy;  Laterality: N/A;  . ENDOSCOPIC RETROGRADE CHOLANGIOPANCREATOGRAPHY (ERCP) WITH PROPOFOL N/A 12/25/2019   Procedure: ENDOSCOPIC RETROGRADE CHOLANGIOPANCREATOGRAPHY (ERCP) WITH PROPOFOL;  Surgeon: Gatha Mayer, MD;  Location: [place];  Service: Gastroenterology;  Laterality: N/A;  . ESOPHAGOGASTRODUODENOSCOPY (EGD) WITH PROPOFOL N/A 01/06/2020   Procedure: ESOPHAGOGASTRODUODENOSCOPY (EGD) WITH PROPOFOL;  Surgeon: Irene Shipper, MD;  Location: [facility];  Service: Endoscopy;  Laterality: N/A;  . HOT HEMOSTASIS N/A 01/06/2020   Procedure: HOT HEMOSTASIS (ARGON PLASMA COAGULATION/BICAP);  Surgeon: Irene Shipper, MD;  Location: [facility];  Service: Endoscopy;  Laterality: N/A;  . INTRAMEDULLARY (IM) NAIL INTERTROCHANTERIC Right 10/01/2018   Procedure: INTRAMEDULLARY (IM) NAIL INTERTROCHANTRIC;  Surgeon: Thornton Park, MD;  Location: [facility];  Service: Orthopedics;  Laterality: Right;  . LAPAROSCOPY N/A 02/21/2013   Procedure: LAPAROSCOPY OPERATIVE;  Surgeon: Margarette Asal, MD;  Location: [facility];  Service: Gynecology;  Laterality: N/A;  REQUESTING 5MM SCOPE WITH CAMERA  . LEFT HEART CATH AND CORONARY ANGIOGRAPHY N/A 05/11/2019   Procedure: LEFT HEART CATH AND CORONARY ANGIOGRAPHY;  Surgeon: Sherren Mocha, MD;  Location: [place] [facility];  Service: Cardiovascular;  Laterality: N/A;  . REMOVAL OF STONES  12/25/2019   Procedure: Karlyn Agee;  Surgeon: Gatha Mayer, MD;  Location: [place];  Service: Gastroenterology;;  .  SCLEROTHERAPY  01/06/2020   Procedure: SCLEROTHERAPY;  Surgeon: Irene Shipper, MD;  Location: [facility];  Service: Endoscopy;;  . Joan Mayans  12/25/2019   Procedure: Joan Mayans;  Surgeon: Gatha Mayer, MD;  Location: [place];  Service: Gastroenterology;;  . TONSILLECTOMY    . TUBAL LIGATION    . UPPER GASTROINTESTINAL ENDOSCOPY       OB History   No obstetric history on file.     Family History  Problem Relation Age of Onset  . Colon cancer Father   . Prostate cancer Father   . Heart disease Mother        prev MVR, also has DJD  . Colon cancer Brother   . Skin cancer Sister   . Alcohol abuse Daughter   . Bipolar disorder Daughter   . Drug abuse Daughter    . Esophageal cancer Neg Hx   . Rectal cancer Neg Hx   . Stomach cancer Neg Hx     Social History   Tobacco Use  . Smoking status: Current Every Day Smoker    Packs/day: 1.00    Years: 30.00    Pack years: 30.00    Types: Cigarettes  . Smokeless tobacco: Never Used  Vaping Use  . Vaping Use: Never used  Substance Use Topics  . Alcohol use: Yes    Alcohol/week: 3.0 standard drinks    Types: 3 Standard drinks or equivalent per week    Comment: occasional  . Drug use: No    Home Medications Prior to Admission medications   Medication Sig Start Date End Date Taking? Authorizing Provider  azithromycin (ZITHROMAX) 250 MG tablet Take 1 tablet (250 mg total) by mouth daily. Take first 2 tablets together, then 1 every day until finished. 05/05/21   Corena Herter, PA-C  budesonide (ENTOCORT EC) 3 MG 24 hr capsule TAKE 3 CAPSULES ONCE DAILY. 05/13/21   Gatha Mayer, MD  buPROPion (WELLBUTRIN SR) 150 MG 12 hr tablet Take 1 tablet (150 mg total) by mouth daily. 04/23/21 10/20/21  Elvin So, MD  denosumab (PROLIA) 60 MG/ML SOSY injection Inject 60 mg into the skin every 6 (six) months.    [provider]  diclofenac Sodium (VOLTAREN) 1 % GEL Apply 2 g topically 4 (four) times daily as needed (for joint pain). 02/21/21   Elodia Florence., MD  hyoscyamine (ANASPAZ) 0.125 MG TBDP disintergrating tablet Place 1 tablet (0.125 mg total) under the tongue every 6 (six) hours as needed ([name] cramps). 04/09/21   Gatha Mayer, MD  methocarbamol (ROBAXIN) 500 MG tablet Take 1 tablet (500 mg total) by mouth every 6 (six) hours as needed for muscle spasms. 02/17/21   Bonnielee Haff, MD  morphine (MS CONTIN) 15 MG 12 hr tablet Take 1 tablet (15 mg total) by mouth 3 (three) times daily. 02/17/21   Bonnielee Haff, MD  nicotine (NICODERM CQ - DOSED IN MG/24 HOURS) 21 mg/24hr patch Place 1 patch (21 mg total) onto the skin daily. 01/05/20   Swayze, Ava, DO  nitroGLYCERIN (NITROSTAT) 0.4 MG  SL tablet Place 1 tablet (0.4 mg total) under the tongue every 5 (five) minutes as needed for chest pain. 04/22/20   Josue Hector, MD  ondansetron (ZOFRAN-ODT) 4 MG disintegrating tablet DISSOLVE 1 TABLET ON TONGUE EVERY 8 HOURS AS NEEDED FOR NAUSEA/VOMITING. 04/09/21   Gatha Mayer, MD  Oxycodone HCl 10 MG TABS Take 1 tablet (10 mg total) by mouth 4 (four) times daily as needed.  02/17/21   Bonnielee Haff, MD  pantoprazole (PROTONIX) 40 MG tablet TAKE (1) TABLET TWICE A DAY BEFORE MEALS. Patient taking differently: Take 40 mg by mouth 2 (two) times daily as needed (acid reflux). 01/29/21   Gatha Mayer, MD  polyethylene glycol (MIRALAX) 17 g packet Take 17 g by mouth 2 (two) times daily. 05/22/21   White, Leitha Schuller, NP  potassium chloride (KLOR-CON) 10 MEQ tablet Take 1 tablet (10 mEq total) by mouth daily. 05/17/21 06/16/21  Wyvonnia Dusky, MD  rosuvastatin (CRESTOR) 5 MG tablet Take 1 tablet (5 mg total) by mouth 3 (three) times a week. 08/28/20   Josue Hector, MD  sertraline (ZOLOFT) 100 MG tablet Take 2 tablets (200 mg total) by mouth at bedtime. 04/23/21 10/20/21  Elvin So, MD  spironolactone (ALDACTONE) 25 MG tablet Take 0.5 tablets (12.5 mg total) by mouth daily. Patient taking differently: Take 12.5 mg by mouth daily as needed (swelling). 04/22/20   Josue Hector, MD  sucralfate (CARAFATE) 1 g tablet Take 1 tablet (1 g total) by mouth 4 (four) times daily -  with meals and at bedtime for 14 days. 05/05/21 05/19/21  Corena Herter, PA-C  valACYclovir (VALTREX) 500 MG tablet Take 500 mg by mouth 2 (two) times daily as needed (flare ups).     [provider]    Allergies    Atorvastatin, Levofloxacin, Omnicef [cefdinir], Trazodone and nefazodone, Sulfa antibiotics, and Sulfonamide derivatives  Review of Systems   Review of Systems  Constitutional: Negative for chills, diaphoresis, fatigue and fever.  HENT: Negative for congestion.   Eyes: Negative for visual  disturbance.  Respiratory: Negative for cough, chest tightness and shortness of breath.   Cardiovascular: Negative for chest pain, palpitations and leg swelling.  Gastrointestinal: Positive for abdominal pain and constipation. Negative for abdominal distention, diarrhea, nausea and vomiting.  Genitourinary: Negative for dysuria and flank pain.  Musculoskeletal: Positive for back pain. Negative for neck pain and neck stiffness.  Skin: Negative for rash and wound.  Neurological: Negative for dizziness, light-headedness and headaches.  Psychiatric/Behavioral: Negative for agitation.  All other systems reviewed and are negative.   Physical Exam Updated Vital Signs BP 123/89   Pulse 87   Temp 99 F (37.2 C)   Resp 15   Ht 5\' 3"  (1.6 m)   Wt 60 kg   SpO2 90%   BMI 23.43 kg/m   Physical Exam Vitals and nursing note reviewed.  Constitutional:      General: She is not in acute distress.    Appearance: She is well-developed. She is not ill-appearing, toxic-appearing or diaphoretic.  HENT:     Head: Normocephalic and atraumatic.     Right Ear: External ear normal.     Left Ear: External ear normal.     Nose: Nose normal. No congestion or rhinorrhea.     Mouth/Throat:     Mouth: Mucous membranes are moist.     Pharynx: No oropharyngeal exudate.  Eyes:     Extraocular Movements: Extraocular movements intact.     Conjunctiva/sclera: Conjunctivae normal.     Pupils: Pupils are equal, round, and reactive to light.  Cardiovascular:     Rate and Rhythm: Normal rate.     Pulses: Normal pulses.     Heart sounds: No murmur heard.   Pulmonary:     Effort: No respiratory distress.     Breath sounds: No stridor. No wheezing, rhonchi or rales.  Chest:  Chest wall: No tenderness.  Abdominal:     General: Abdomen is flat. There is no distension.     Tenderness: There is abdominal tenderness. There is no right CVA tenderness, left CVA tenderness, guarding or rebound.  Musculoskeletal:         General: Tenderness present.     Cervical back: Normal range of motion and neck supple. No tenderness.     Right lower leg: No edema.     Left lower leg: No edema.  Skin:    General: Skin is warm.     Capillary Refill: Capillary refill takes less than 2 seconds.     Coloration: Skin is not pale.     Findings: No erythema or rash.  Neurological:     Mental Status: She is alert and oriented to person, place, and time.     Sensory: No sensory deficit.     Motor: No weakness or abnormal muscle tone.     Deep Tendon Reflexes: Reflexes are normal and symmetric.  Psychiatric:        Mood and Affect: Mood normal.     ED Results / Procedures / Treatments   Labs (all labs ordered are listed, but only abnormal results are displayed) Labs Reviewed  COMPREHENSIVE METABOLIC PANEL - Abnormal; Notable for the following components:      Result Value   Potassium 3.4 (*)    Chloride 97 (*)    BUN 7 (*)    Calcium 8.6 (*)    Total Protein 6.4 (*)    Albumin 3.4 (*)    All other components within normal limits  CBC - Abnormal; Notable for the following components:   RBC 3.66 (*)    Hemoglobin 11.6 (*)    All other components within normal limits  TROPONIN I (HIGH SENSITIVITY) - Abnormal; Notable for the following components:   Troponin I (High Sensitivity) 92 (*)    All other components within normal limits  URINE CULTURE  LIPASE, BLOOD  URINALYSIS, ROUTINE W REFLEX MICROSCOPIC  LACTIC ACID, PLASMA  LACTIC ACID, PLASMA  TROPONIN I (HIGH SENSITIVITY)    EKG EKG Interpretation  Date/Time:  Saturday May 24 2021 10:06:59 EDT Ventricular Rate:  84 PR Interval:  146 QRS Duration: 105 QT Interval:  410 QTC Calculation: 485 R Axis:   88 Text Interpretation: Sinus rhythm Borderline right axis deviation Baseline wander in lead(s) V4 when compared to prior more wandering baseline. No STEMI Confirmed by Antony Blackbird [phone]) on 05/24/2021 11:04:56 AM   Radiology CT ABDOMEN PELVIS  WO CONTRAST  Result Date: 05/24/2021 CLINICAL DATA:  Constipation EXAM: CT ABDOMEN AND PELVIS WITHOUT CONTRAST TECHNIQUE: Multidetector CT imaging of the abdomen and pelvis was performed following the standard protocol without IV contrast. COMPARISON:  May 17, 2021 FINDINGS: Lower chest: RIGHT lower lobe atelectasis. Hepatobiliary: Mildly nodular contour of the liver could reflect underlying cirrhosis. Status post cholecystectomy. Unchanged appearance of a prominent common bile duct, likely due to post cholecystectomy state. Pancreas: Unremarkable noncontrast appearance. Spleen: Unremarkable. Adrenals/Urinary Tract: Likely nonobstructive RIGHT-sided nephrolithiasis, unchanged. Calcifications of the LEFT kidney are favored to be vascular in origin. No hydronephrosis. Bladder is decompressed. Adrenal glands are unremarkable. Stomach/Bowel: No bowel obstruction. Moderate colonic stool burden of predominantly the RIGHT and transverse colon. Status post appendectomy. No new focal bowel wall inflammation. Small hiatal hernia. Vascular/Lymphatic: Severe atherosclerotic calcifications. No new suspicious lymphadenopathy. Reproductive: Uterus and bilateral adnexa are unremarkable. Other: No free air or free fluid. Musculoskeletal: Osteopenia. Status post posterior fixation  of the lower lumbar spine. Status post intramedullary rod fixation of the RIGHT femur. Similar trace lucency adjacent to the RIGHT inferior screw of lumbar spinal hardware. Remote RIGHT posterior eleventh rib fracture. IMPRESSION: 1. No evidence of bowel obstruction. Moderate colonic stool burden predominately in the RIGHT and transverse colon. 2. Mildly nodular contour of the liver could reflect underlying cirrhosis. Aortic Atherosclerosis (ICD10-I70.0). Electronically Signed   By: Valentino Saxon MD   On: 05/24/2021 13:15   DG Chest 2 View  Result Date: 05/24/2021 CLINICAL DATA:  Hypoxia.  Constipation for 2 weeks. EXAM: CHEST - 2 VIEW  COMPARISON:  05/05/2021 and older studies. FINDINGS: Cardiac silhouette is normal in size. No mediastinal or hilar masses or evidence of adenopathy. Lungs mildly hyperexpanded. Mild linear opacity at the left lung base consistent with scarring or atelectasis. Lungs otherwise clear. No pleural effusion or pneumothorax. Skeletal structures are demineralized. Mild compression fracture a midthoracic vertebra, T7 or T8, not evident on the previous chest CT from 05/05/2021. The T4 fracture seen on the prior study is not well visualized on the current exam. No other evidence of a fracture. No bone lesion. IMPRESSION: 1. No acute cardiopulmonary disease. 2. Mild compression fracture of a midthoracic vertebra new since the CT dated 05/05/2021. Electronically Signed   By: Lajean Manes M.D.   On: 05/24/2021 10:11   MR THORACIC SPINE WO CONTRAST  Result Date: 05/24/2021 CLINICAL DATA:  Mid back pain. EXAM: MRI THORACIC SPINE WITHOUT CONTRAST TECHNIQUE: Multiplanar, multisequence MR imaging of the thoracic spine was performed. No intravenous contrast was administered. COMPARISON:  Chest radiograph May 24, 2021 and chest CT May 05, 2021. FINDINGS: Motion limited evaluation.  Within this limitation: Alignment: Exaggerated thoracic kyphosis. Reverse S-shaped thoracolumbar curvature. Vertebrae: Approximately 40% height loss of T7 vertebral body with associated bone marrow edema, compatible with acute/recent compression fracture. There is mild surrounding paraspinous edema. Slight bony retropulsion along the superior endplate without significant canal stenosis. Mild bilateral foraminal stenosis at T7-T8. Remote T4 compression fracture with approximately 40% height loss and no bone marrow edema. Height loss is similar to priors. Cord:  Normal cord signal. Paraspinal and other soft tissues: Unremarkable. Partially imaged left cyst. Disc levels: Motion limited evaluation without evidence of significant canal stenosis. Small right  eccentric disc bulge at T8-T9 partially effaces CSF. Slight bony retropulsion T7 also slightly root effaces ventral CSF. Mild foraminal stenosis bilaterally at T7 T8 and on the left at T8-T9 and T9-T10. IMPRESSION: 1. Acute/recent T7 compression fracture with approximately 40% height loss. Slight bony retropulsion without significant canal stenosis. 2. Remote T4 compression fracture without progressive height loss. 3. Multilevel mild foraminal stenosis is detailed above. Electronically Signed   By: Margaretha Sheffield MD   On: 05/24/2021 14:36   MR LUMBAR SPINE WO CONTRAST  Result Date: 05/24/2021 CLINICAL DATA:  Low back pain. EXAM: MRI LUMBAR SPINE WITHOUT CONTRAST TECHNIQUE: Multiplanar, multisequence MR imaging of the lumbar spine was performed. No intravenous contrast was administered. COMPARISON:  May 26, 2017. FINDINGS: Segmentation: The inferior-most fully formed intervertebral disc is labeled L5-S1, consistent with prior numbering. Alignment: Moderate rotatory dextrocurvature. Grade 1 retrolisthesis of L3 on L4, similar to prior. Vertebrae: Vertebral body heights are maintained. No specific evidence of acute fracture or discitis/osteomyelitis, although metallic artifact from posterior fusion hardware at L4-S1 limits evaluation. Conus medullaris and cauda equina: The conus is not imaged on this study. The conus appears unremarkable on concurrent MRI of the thoracic spine and terminates at the superior L1  level. Paraspinal and other soft tissues: Bilateral renal cyst including a complex cyst in the interpolar right kidney which is similar in size (approximately 8 mm). Additional complex right renal cyst or not imaged on the prior. Disc levels: Motion limited evaluation.  Within this limitation: T12-L1: Not imaged. L1-L2: Small right foraminal disc protrusion without significant canal or foraminal stenosis. L2-L3: Mild bilateral facet hypertrophy and small bilateral foraminal disc protrusions without  significant canal or foraminal stenosis. L3-L4: Degenerative disc height loss and endplate changes. Right eccentric disc bulge with endplate spurring and mild bilateral facet hypertrophy. Resulting moderate right and mild left foraminal stenosis, likely mildly progressed from the prior. Similar mild to moderate right subarticular recess stenosis and mild canal stenosis. L4-L5: Posterior fusion and decompression. No significant canal or foraminal stenosis. L5-S1: Mild disc bulging and right greater than left facet hypertrophy with mild right and borderline left foraminal stenosis, similar. IMPRESSION: 1. L4-L5 PLIF. Motion limited evaluation with suspected mild progression of adjacent level degenerative change at L3-L4 where there is moderate right and mild left foraminal stenosis, mild to moderate right subarticular recess stenosis, and mild canal stenosis. 2. Similar L5-S1 degenerative change with suspected mild right foraminal stenosis. 3. Similar size of an 8 mm complex right interpolar cyst. Additional complex right renal cysts were not imaged on the prior. Consider non urgent renal ultrasound to further evaluate. Electronically Signed   By: Margaretha Sheffield MD   On: 05/24/2021 14:52    Procedures Procedures   Medications Ordered in ED Medications  HYDROmorphone (DILAUDID) injection 1 mg (1 mg Intravenous Given 05/24/21 1022)  iohexol (OMNIPAQUE) 9 MG/ML oral solution (500 mLs  Contrast Given 05/24/21 1021)  HYDROmorphone (DILAUDID) injection 1 mg (1 mg Intravenous Given 05/24/21 1315)  ondansetron (ZOFRAN) injection 4 mg (4 mg Intravenous Given 05/24/21 1316)    ED Course  I have reviewed the triage vital signs and the nursing notes.  Pertinent labs & imaging results that were available during my care of the patient were reviewed by me and considered in my medical decision making (see chart for details).    MDM Rules/Calculators/A&P                          TIFANNY DOLLENS is a 70 y.o.  female with a past medical history significant for diverticulosis, Crohn's, prior appendectomy, GERD, osteoporosis, vertigo, hypertension, CHF, prior cholecystectomy, and chronic back pain who presents with continued abdominal pain, worsened back pain, and no bowel movement for 2 weeks.  Patient reports that multiple times, emergency department for her abdominal pain they have told her may be related to her back and she has been trying to follow-up with her back team for further management.  She reports that she is having severe abdominal pain diffusely but is not having any bowel movements.  She reports is not passing any gas.  She is concerned because of history of Crohn's in her family history of small bowel obstruction that she could have obstruction.  She reports she is not any dysuria but has been urinating on herself over the last few weeks without meaning to.  This is new.  She denies any fevers or chills congestion, or cough.  She denies shortness of breath or chest pain but she reports her abdominal pain is worse when she takes a deep breath.  She reports her pain between her shoulder blades and mid low back have been worsened despite pain medicines.  She  reports that she did have a fall a while back.  She denies any numbness or weakness in the legs and still able to ambulate reportedly.  Denies any headache or neck pain.  On exam, lungs are clear and chest is nontender.  Abdomen is diffusely tender but bowel sounds were appreciated.  Back was tender to palpation both in the mid and mid low back.  There was some paraspinal tenderness and spasm palpated on the right worse than left.  Intact sensation and strength in the legs.  Hips nontender.  Clinically I am concerned about several etiologies.  With her reported multiple abdominal surgeries, Crohn's, and worsened abdominal pain with no bowel movements or flatus over the last 2 weeks, I do feel we need to rule out development of a bowel obstruction  despite having recent CT images over the last few weeks that did not show this.  I am also concerned that her back may be the source of some of these troubles if she is having cord compression or even cauda equina with the new urinary incontinence and the lack of urge to have a bowel movement with this constipation.  Had a discussion with the patient she agrees to get MRI of the thoracic and lumbar spine as well as CT scan of the abdomen.  We will get chest x-ray due to the pleuritic discomfort in her abdomen.  We will get screening labs.  We will give the patient pain medicine and she will remain n.p.o. during her work-up.  Anticipate reassessment for work-up to determine decision.  3:17 PM CT of the pelvis does not show acute obstruction but does show stool burden increase.  Also appears some nodular contours of the liver could reflect underlying cirrhosis.  MRIs returned showing evidence of similar L-spine degenerative changes and previous surgery with some suspected mild progression.  The thoracic spine MRI shows acute T7 compression fracture with 40% height loss as well as a remote T4 compression fracture without progressive height loss.  There is some slight bony retropulsion but no significant canal stenosis.  No evidence of cauda equina or cord compression seen.  Now that there does not appear to be an acute spinal cause of her constipation, I suspect is more chronic constipation.  She is waiting on delta troponin as well due to the left lower chest discomfort and a troponin that has risen since last evaluation.  If delta troponin is not significantly worsen, suspect she is stable for outpatient follow-up in this regard..   Second troponin is improving.  Will call neurosurgery to discuss if bracing would help with pain.  Patient will need to follow-up with her gastroenterologist for the continued constipation but no evidence of obstruction.  We will have her continue her outpatient GI regimen  and she will need to speaking with her back team and pain team in regards to further management of the back pain that is likely contributing to the discomfort on her side and abdomen.  There is no evidence of acute surgical problem in her abdomen or pelvis and no evidence of bowel obstruction.  Awaiting neurosurgery consultation and patient will likely be stable for discharge home.  3:48 PM Spoke to neurosurgery who recommended TLSO brace.  We will have patient follow-up with her spine surgeon but if she cannot get in with them, she can follow-up with Dr. Vertell Limber with neurosurgery.  She will use her outpatient pain regimen and will call her gastroenterologist for continued constipation troubles.  There is  no evidence of obstruction on imaging.   Final Clinical Impression(s) / ED Diagnoses Final diagnoses:  Compression fracture of T7 vertebra, initial encounter ([facility]CC)  Compression fracture of T4 vertebra, sequela  Acute back pain, unspecified back location, unspecified back pain laterality  Constipation, unspecified constipation type  Generalized abdominal pain     Clinical Impression: 1. Compression fracture of T7 vertebra, initial encounter ([place])   2. Compression fracture of T4 vertebra, sequela   3. Acute back pain, unspecified back location, unspecified back pain laterality   4. Constipation, unspecified constipation type   5. Generalized abdominal pain     Disposition: Awaiting delta troponin and neurosurgery recommendations for anticipate discharge home for outpatient follow-up with GI for constipation and neurosurgery for her back pain and spine fractures.  This note was prepared with assistance of Systems analyst. Occasional wrong-word or sound-a-like substitutions may have occurred due to the inherent limitations of voice recognition software.     Genova Kiner, Gwenyth Allegra, MD 05/24/21 1630

## 2021-05-24 NOTE — ED Triage Notes (Signed)
Patient reports constipation for 2 weeks unrelieved by laxatives with pain/pressure across her abdomen , denies emesis or fever .

## 2021-05-24 NOTE — ED Notes (Signed)
Pt's oxygen saturation keeps dropping to the mid 80's. Per EDP place pt on 2L [place]. Pt placed on 2L [place]. Will continue to monitor.

## 2021-05-24 NOTE — Progress Notes (Signed)
Orthopedic Tech Progress Note Patient Details:  Denise King [birth year] [id number] [employer] TLSO to patient Ortho Devices Ortho Device/Splint Location: TLSO Ortho Device/Splint Interventions: Application,Ordered,Adjustment   Post Interventions Patient Tolerated: Well Instructions Provided: Adjustment of device,Care of device   Azuree Minish A Quinterious Walraven 05/24/2021, 4:43 PM

## 2021-05-24 NOTE — Discharge Instructions (Addendum)
Your work-up today revealed new compression fracture of the thoracic spine in T7 and confirmed the older injury on T4.  There was no evidence of acute spinal compression.  There was some progression of your lumbar spine disease but your hardware from surgery previously appears in place.  The CT of her abdomen and pelvis did not show evidence of acute obstruction but there was stool present.  No other concerning findings seen on initial imaging.  Clinically I do suspect that your back pain has worsened with the new injuries leading to more constipation from pain medication use and the abdominal discomfort.  I spoke with neurosurgery who recommended the new brace, please wear to help with discomfort and follow-up with the neurosurgery team to discuss further management.  Please use your continued outpatient pain regimen and continue your bowel regimen to encourage bowel movements.  Please call your gastroenterologist for further management of the constipation but hopefully you will be able to decrease your pain use with the brace to help with your bowels.  Please also  try to increase your hydration as this can also drastically help bowel movement.  If any symptoms change acutely or you start having new symptoms such as leg numbness, leg weakness, or new symptoms, please return to the nearest emergency department.

## 2021-05-25 LAB — URINE CULTURE: Culture: <10000 — AB

## 2021-05-28 ENCOUNTER — Telehealth (HOSPITAL_COMMUNITY): Payer: Self-pay | Admitting: *Deleted

## 2021-05-28 DIAGNOSIS — M549 Dorsalgia, unspecified: Secondary | ICD-10-CM | POA: Diagnosis not present

## 2021-05-28 DIAGNOSIS — Z79899 Other long term (current) drug therapy: Secondary | ICD-10-CM | POA: Diagnosis not present

## 2021-05-28 DIAGNOSIS — F17200 Nicotine dependence, unspecified, uncomplicated: Secondary | ICD-10-CM | POA: Diagnosis not present

## 2021-05-28 DIAGNOSIS — F17210 Nicotine dependence, cigarettes, uncomplicated: Secondary | ICD-10-CM | POA: Diagnosis not present

## 2021-05-28 DIAGNOSIS — R69 Illness, unspecified: Secondary | ICD-10-CM | POA: Diagnosis not present

## 2021-05-28 DIAGNOSIS — M25561 Pain in right knee: Secondary | ICD-10-CM | POA: Diagnosis not present

## 2021-05-28 NOTE — Telephone Encounter (Signed)
Placed call to patient to inform of cancelled appointment.  Discussed need for patient to secure another provider and gave information regarding the resource letter that has been mailed.  Patient will not need refills of medications.

## 2021-05-30 DIAGNOSIS — M4854XA Collapsed vertebra, not elsewhere classified, thoracic region, initial encounter for fracture: Secondary | ICD-10-CM | POA: Diagnosis not present

## 2021-05-31 IMAGING — CT CT HEAD W/O CM
3 series · 15 of 47 positions shown, 18 images · non-contrast
Comparison: 10/29/2017

CLINICAL DATA: Recent slip and fall with headaches and neck pain,
initial encounter

EXAM:
CT HEAD WITHOUT CONTRAST
CT CERVICAL SPINE WITHOUT CONTRAST
TECHNIQUE: Multidetector CT imaging of the head and cervical spine was
performed following the standard protocol without intravenous
contrast. Multiplanar CT image reconstructions of the cervical spine
were also generated.

[Series 3: head 5.0 h30s · axial · 0.44mm/px · z∈[-68,+72]mm · 9 of 34 slices shown, 12 images]
[im 3/34  brain]
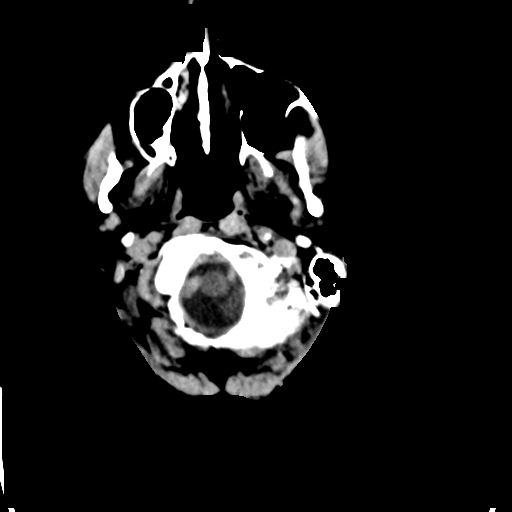
[im 3/34  bone]
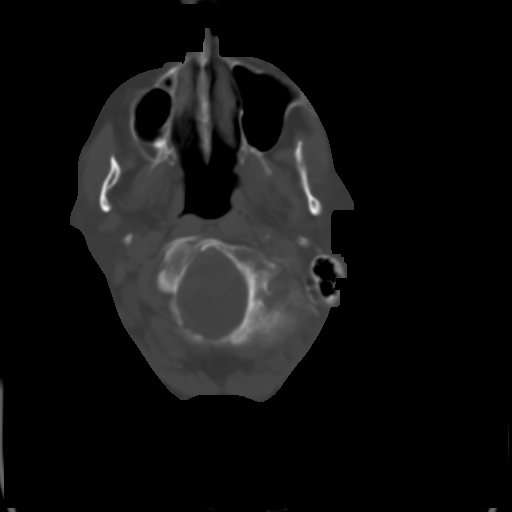
[im 6/34  brain]
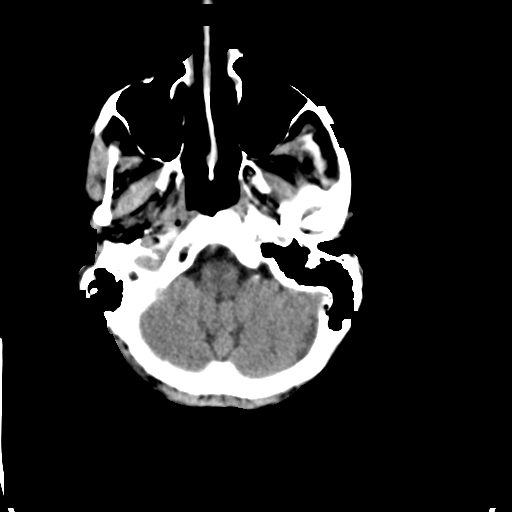
[im 10/34  brain]
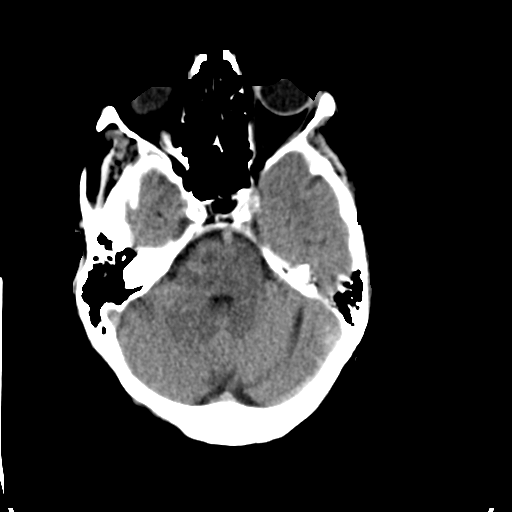
[im 13/34  brain]
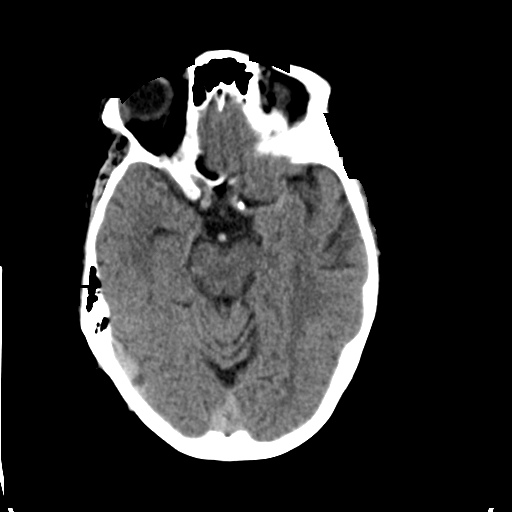
[im 18/34  brain]
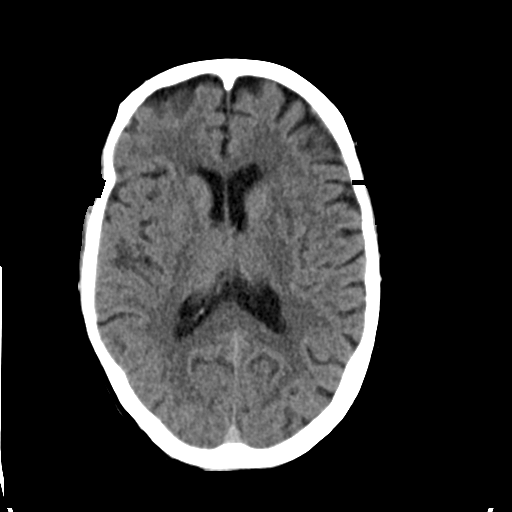
[im 18/34  bone]
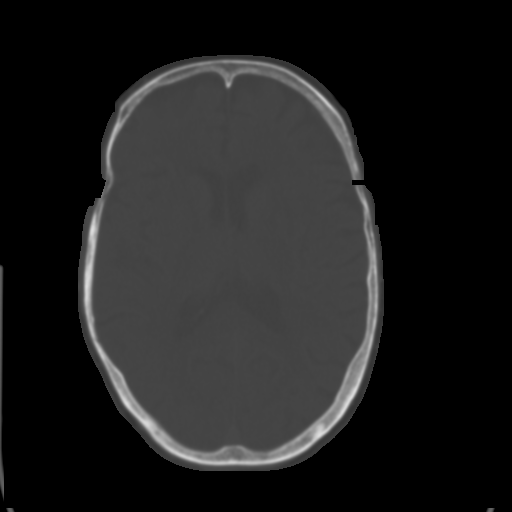
[im 21/34  brain]
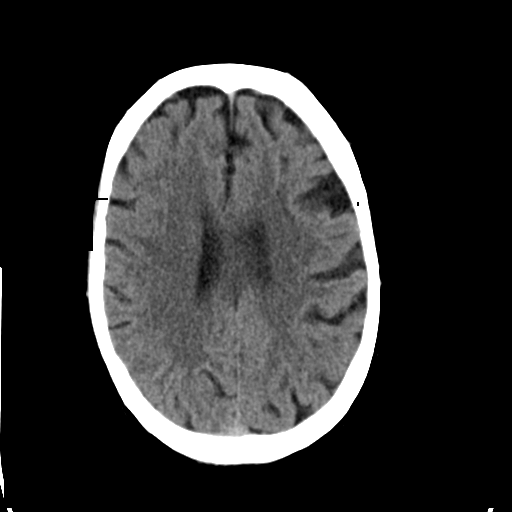
[im 24/34  brain]
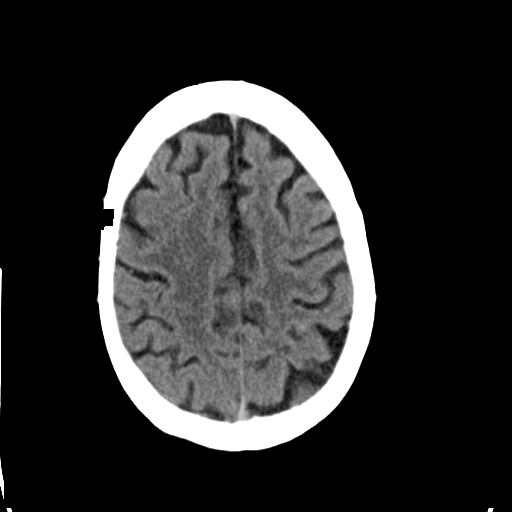
[im 28/34  brain]
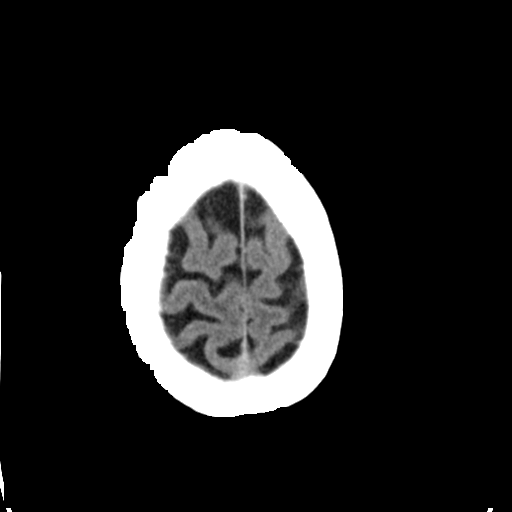
[im 31/34  brain]
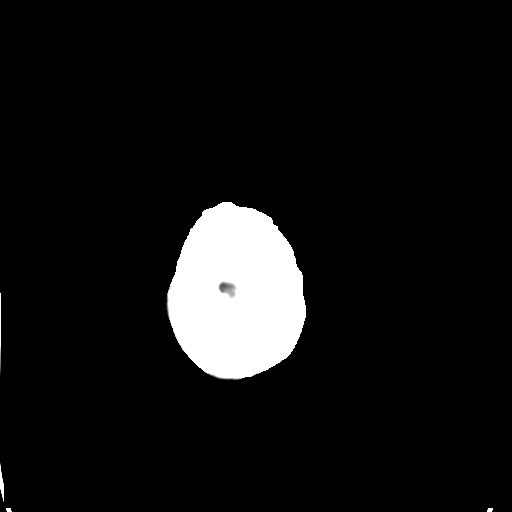
[im 31/34  bone]
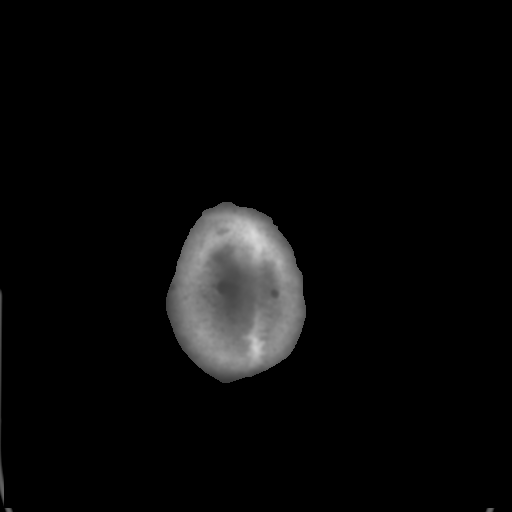

[Series 5: head 3.0 mpr cor · coronal · 0.33mm/px · 3 of 67 slices shown]
[im 23/67  brain]
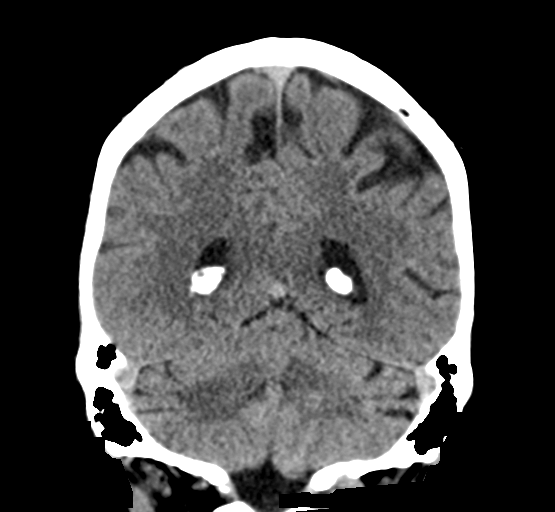
[im 30/67  brain]
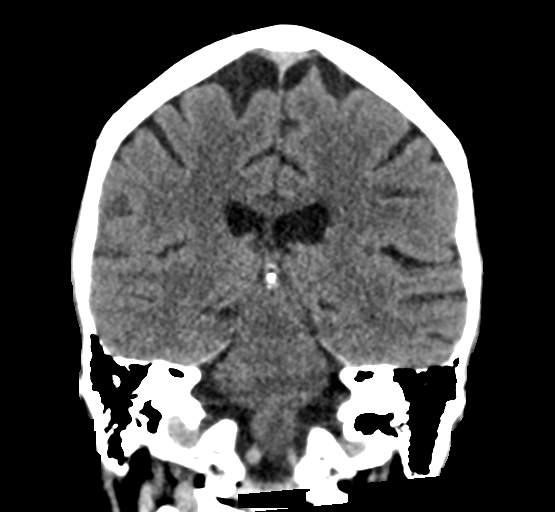
[im 37/67  brain]
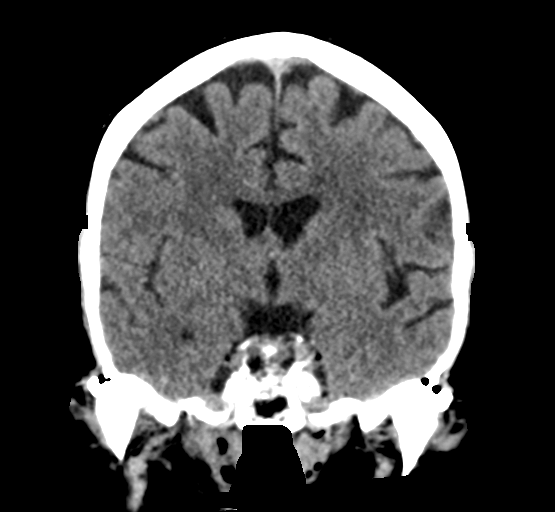

[Series 6: head 3.0 mpr sag · sagittal · 0.33mm/px · 3 of 55 slices shown]
[im 19/55  brain]
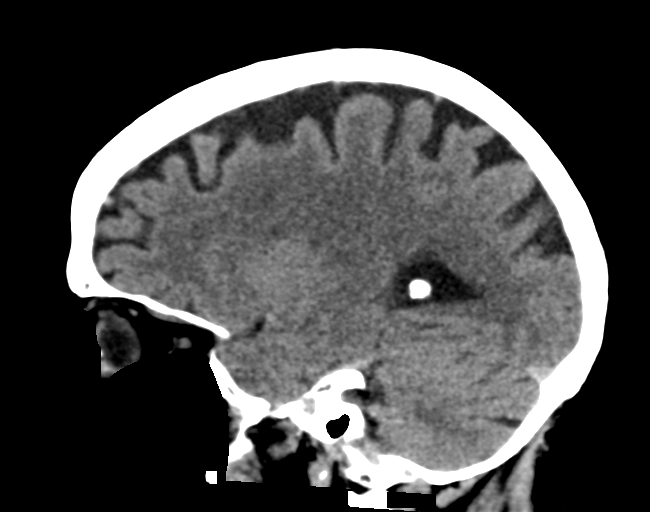
[im 28/55  brain]
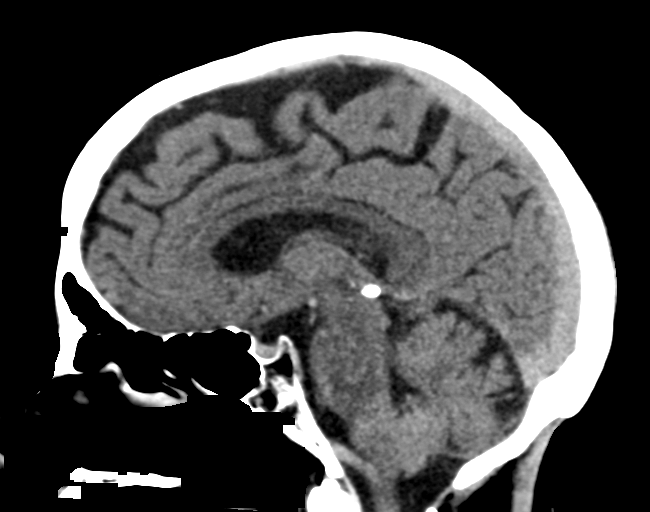
[im 37/55  brain]
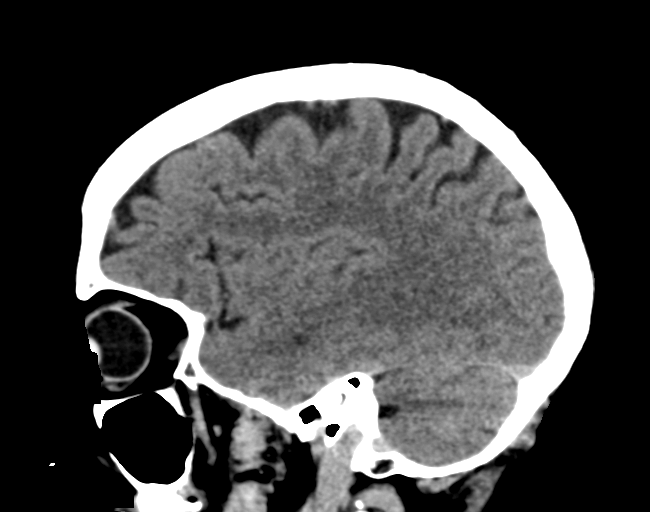

[15 of 47 positions shown; findings below may reference images not displayed]

FINDINGS: CT HEAD FINDINGS

Brain: Mild atrophic and chronic white matter ischemic changes are
noted stable from the prior exam. No findings to suggest acute
hemorrhage, acute infarction or space-occupying mass lesion are
noted.

Vascular: No hyperdense vessel or unexpected calcification.

Skull: Normal. Negative for fracture or focal lesion.

Sinuses/Orbits: No acute finding.

Other: None.

CT CERVICAL SPINE FINDINGS

Alignment: Within normal limits.

Skull base and vertebrae: 7 cervical segments are well visualized.
Vertebral body height is well maintained. Disc space narrowing is
noted at C5-6 with mild vacuum disc phenomenon osteophytic changes.
Multilevel facet hypertrophic changes are noted left slightly
greater than right. No acute fracture or acute facet abnormality is
noted.

Soft tissues and spinal canal: The surrounding soft tissue
structures demonstrate some heterogeneity of the thyroid gland
without definitive lesion. Carotid calcifications are noted.

Upper chest: Visualized lung apices are within normal limits.

Other: None
IMPRESSION: CT of the head: Chronic atrophic and ischemic changes without acute
abnormality.

CT of the cervical spine: Degenerative changes are seen without
acute abnormality.

## 2021-05-31 IMAGING — CT CT CERVICAL SPINE W/O CM
3 series · 14 of 33 positions shown, 17 images · non-contrast
Comparison: 10/29/2017

CLINICAL DATA: Recent slip and fall with headaches and neck pain,
initial encounter

EXAM:
CT HEAD WITHOUT CONTRAST
CT CERVICAL SPINE WITHOUT CONTRAST
TECHNIQUE: Multidetector CT imaging of the head and cervical spine was
performed following the standard protocol without intravenous
contrast. Multiplanar CT image reconstructions of the cervical spine
were also generated.

[Series 5: c_spine 2.0 st · axial · 0.29mm/px · z∈[-176,-52]mm · 6 of 82 slices shown, 8 images]
[im 13/82  soft-tissue]
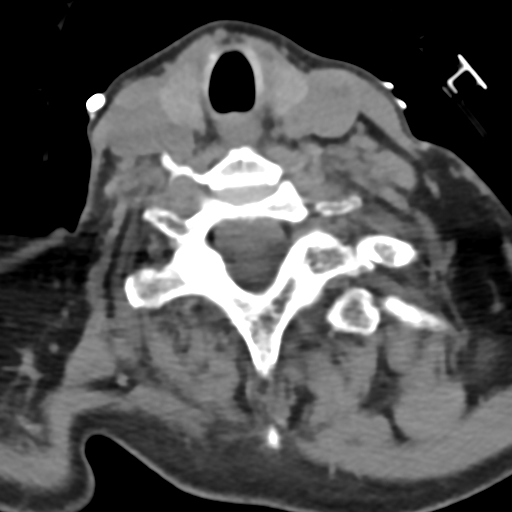
[im 13/82  bone]
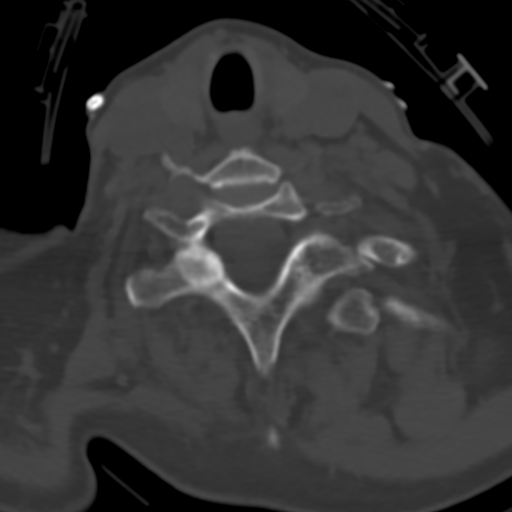
[im 25/82  bone]
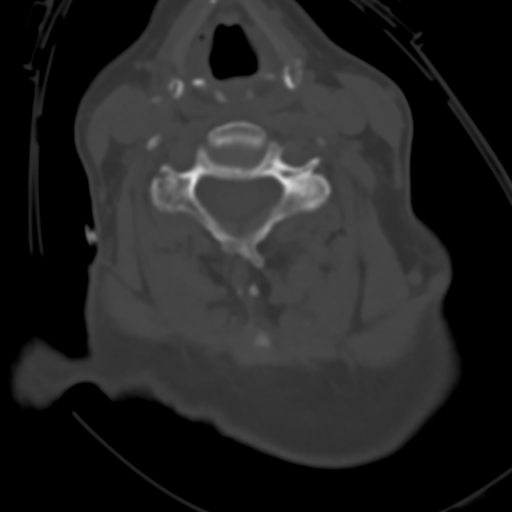
[im 38/82  bone]
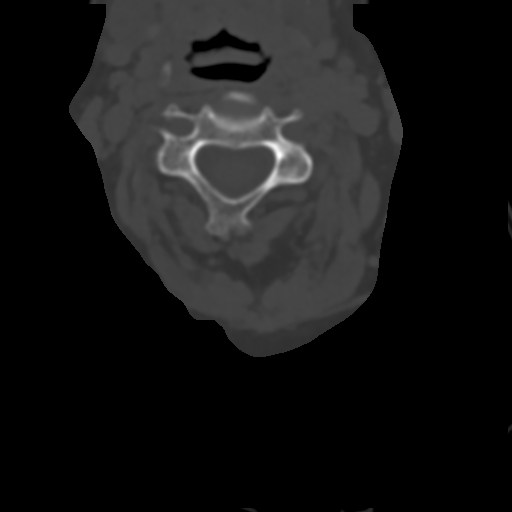
[im 50/82  bone]
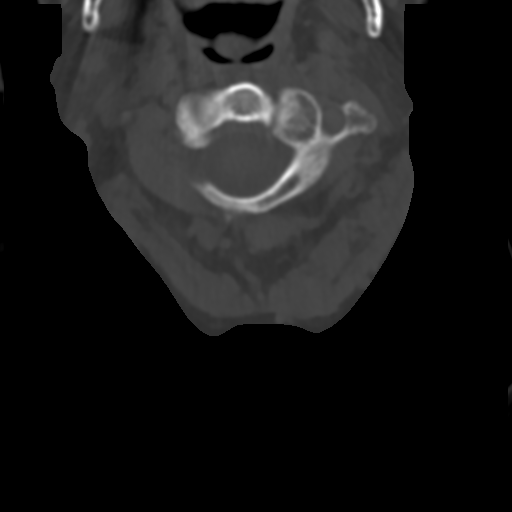
[im 63/82  soft-tissue]
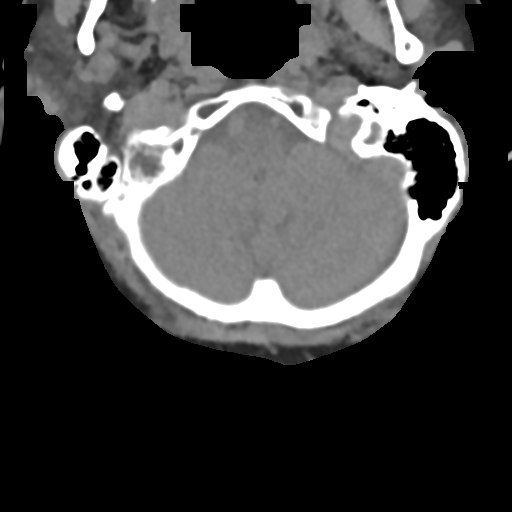
[im 63/82  bone]
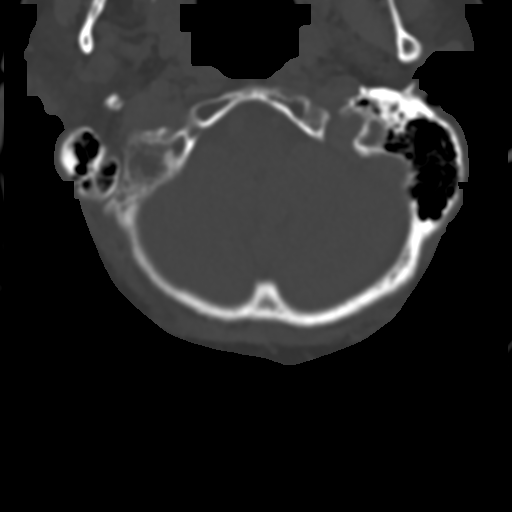
[im 75/82  bone]
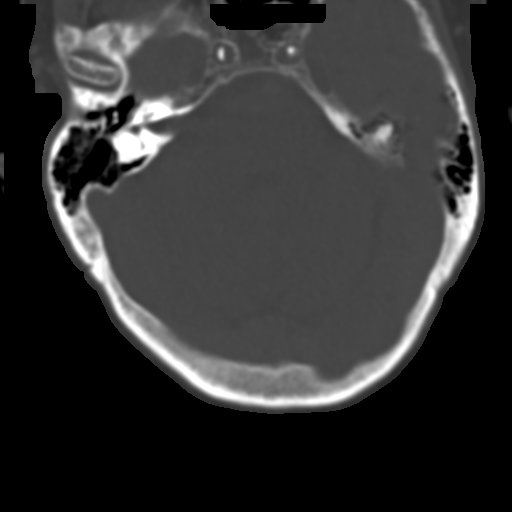

[Series 6: coronal bone · coronal · 0.23mm/px · 3 of 61 slices shown]
[im 13/61  bone]
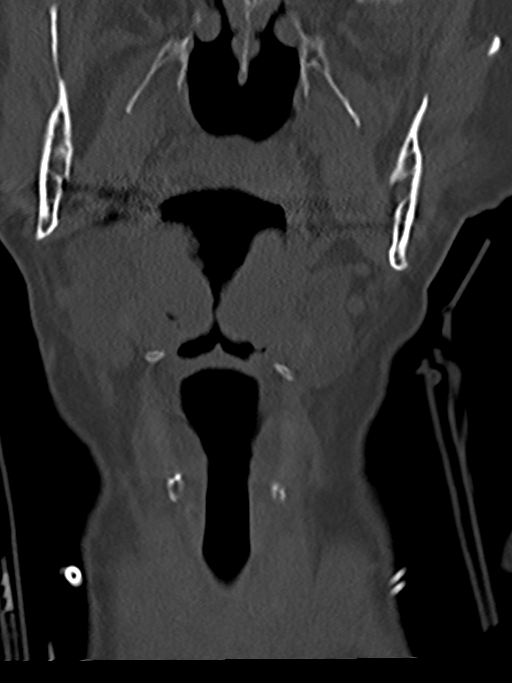
[im 25/61  bone]
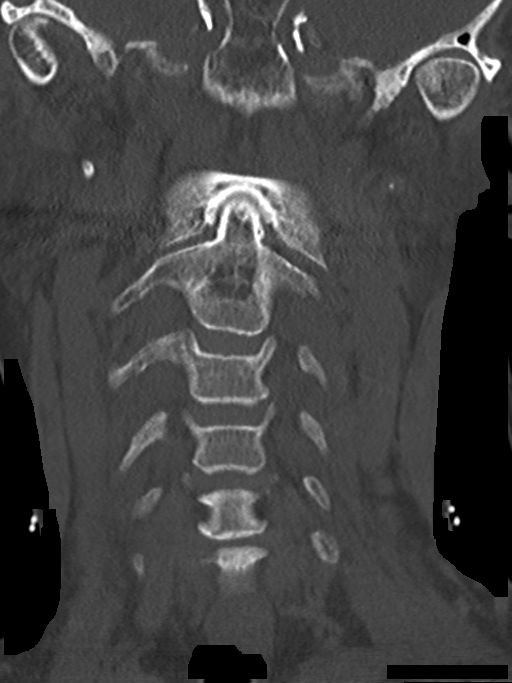
[im 37/61  bone]
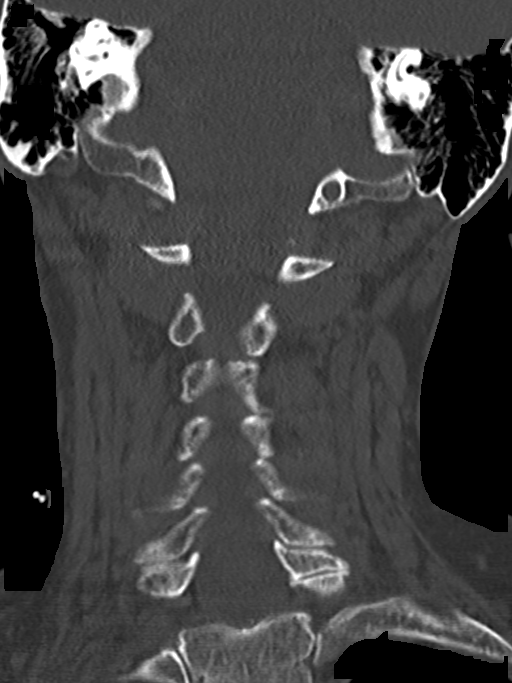

[Series 7: sagittal bone · sagittal · 0.29mm/px · 5 of 63 slices shown, 6 images]
[im 21/63  bone]
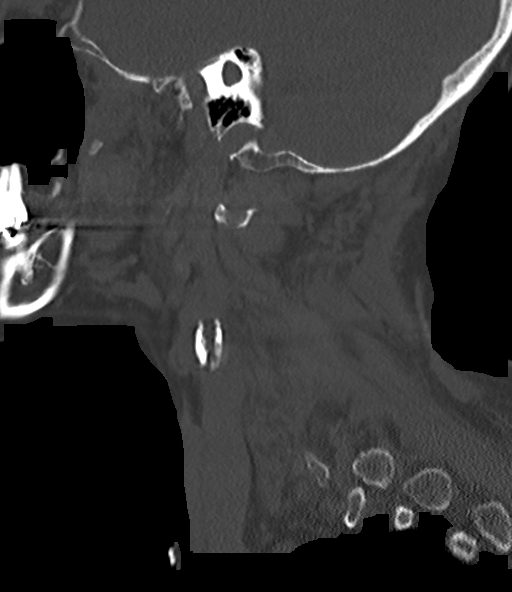
[im 26/63  bone]
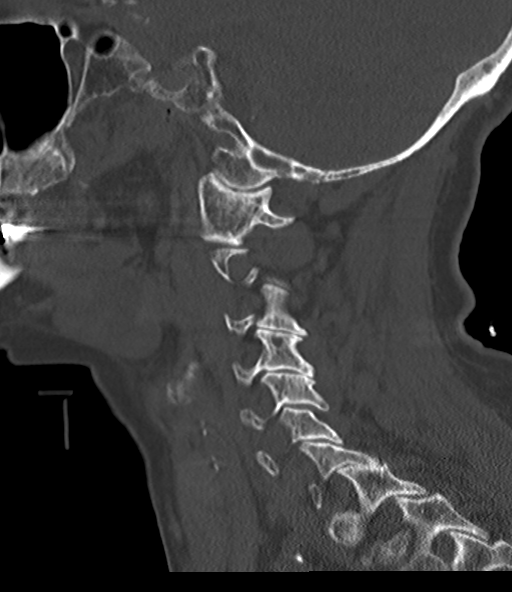
[im 32/63  soft-tissue]
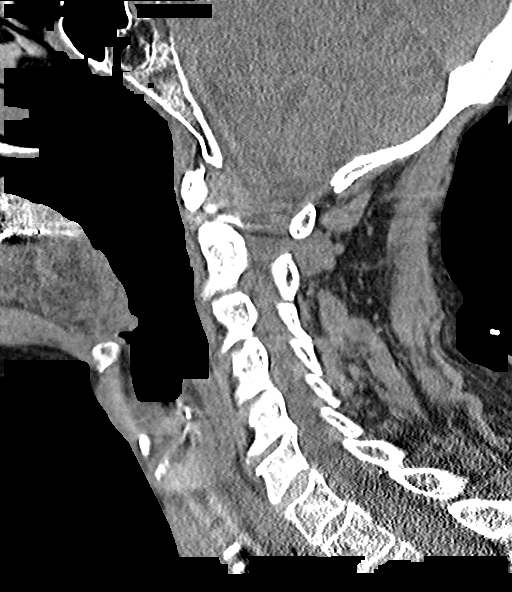
[im 32/63  bone]
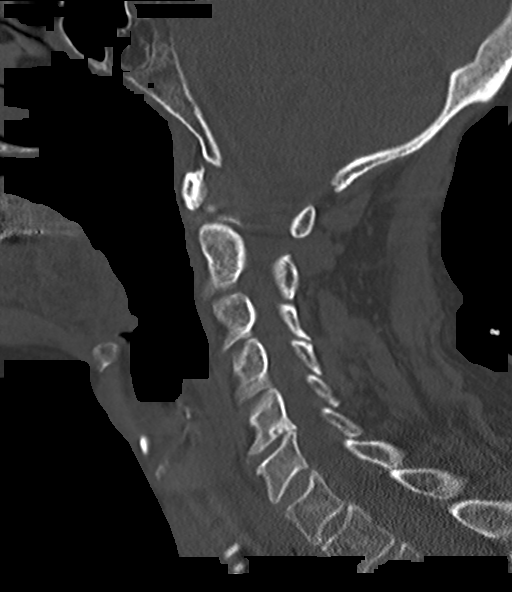
[im 37/63  bone]
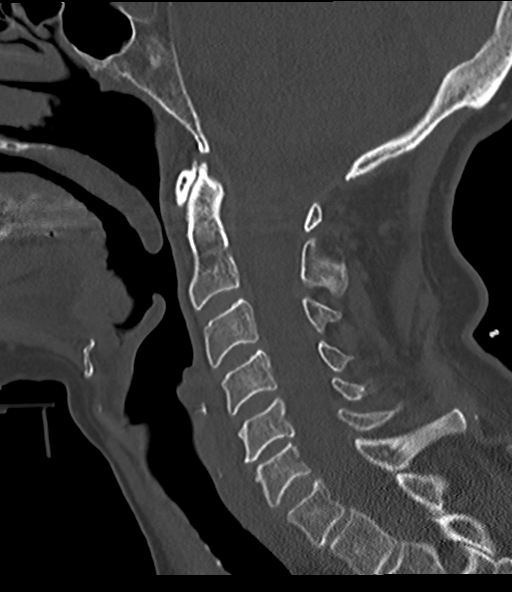
[im 42/63  bone]
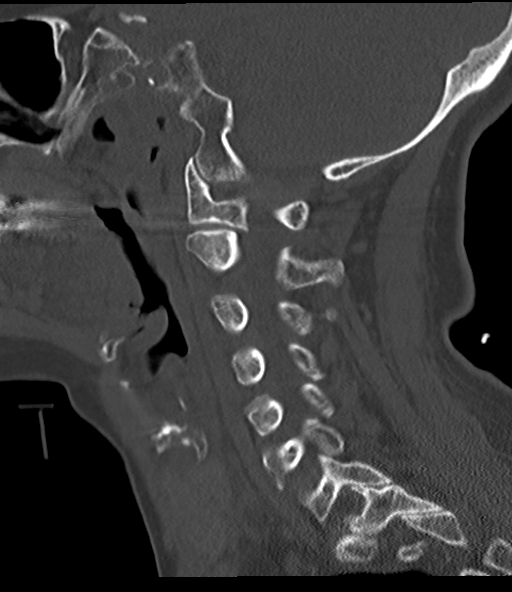

[14 of 33 positions shown; findings below may reference images not displayed]

FINDINGS: CT HEAD FINDINGS

Brain: Mild atrophic and chronic white matter ischemic changes are
noted stable from the prior exam. No findings to suggest acute
hemorrhage, acute infarction or space-occupying mass lesion are
noted.

Vascular: No hyperdense vessel or unexpected calcification.

Skull: Normal. Negative for fracture or focal lesion.

Sinuses/Orbits: No acute finding.

Other: None.

CT CERVICAL SPINE FINDINGS

Alignment: Within normal limits.

Skull base and vertebrae: 7 cervical segments are well visualized.
Vertebral body height is well maintained. Disc space narrowing is
noted at C5-6 with mild vacuum disc phenomenon osteophytic changes.
Multilevel facet hypertrophic changes are noted left slightly
greater than right. No acute fracture or acute facet abnormality is
noted.

Soft tissues and spinal canal: The surrounding soft tissue
structures demonstrate some heterogeneity of the thyroid gland
without definitive lesion. Carotid calcifications are noted.

Upper chest: Visualized lung apices are within normal limits.

Other: None
IMPRESSION: CT of the head: Chronic atrophic and ischemic changes without acute
abnormality.

CT of the cervical spine: Degenerative changes are seen without
acute abnormality.

## 2021-06-02 DIAGNOSIS — M4854XA Collapsed vertebra, not elsewhere classified, thoracic region, initial encounter for fracture: Secondary | ICD-10-CM | POA: Diagnosis not present

## 2021-06-03 ENCOUNTER — Encounter: Payer: Self-pay | Admitting: Internal Medicine

## 2021-06-03 ENCOUNTER — Other Ambulatory Visit: Payer: Self-pay

## 2021-06-03 DIAGNOSIS — I509 Heart failure, unspecified: Secondary | ICD-10-CM | POA: Diagnosis not present

## 2021-06-03 DIAGNOSIS — H43393 Other vitreous opacities, bilateral: Secondary | ICD-10-CM | POA: Diagnosis not present

## 2021-06-03 DIAGNOSIS — H2513 Age-related nuclear cataract, bilateral: Secondary | ICD-10-CM | POA: Diagnosis not present

## 2021-06-03 DIAGNOSIS — H353131 Nonexudative age-related macular degeneration, bilateral, early dry stage: Secondary | ICD-10-CM | POA: Diagnosis not present

## 2021-06-03 DIAGNOSIS — H04123 Dry eye syndrome of bilateral lacrimal glands: Secondary | ICD-10-CM | POA: Diagnosis not present

## 2021-06-03 DIAGNOSIS — H524 Presbyopia: Secondary | ICD-10-CM | POA: Diagnosis not present

## 2021-06-04 ENCOUNTER — Ambulatory Visit (INDEPENDENT_AMBULATORY_CARE_PROVIDER_SITE_OTHER): Payer: Medicare HMO | Admitting: Internal Medicine

## 2021-06-04 ENCOUNTER — Encounter: Payer: Self-pay | Admitting: Internal Medicine

## 2021-06-04 ENCOUNTER — Other Ambulatory Visit: Payer: Self-pay

## 2021-06-04 VITALS — BP 138/76 | HR 98 | Temp 98.1°F | Ht 63.0 in | Wt 112.0 lb

## 2021-06-04 DIAGNOSIS — F419 Anxiety disorder, unspecified: Secondary | ICD-10-CM | POA: Diagnosis not present

## 2021-06-04 DIAGNOSIS — S22040D Wedge compression fracture of fourth thoracic vertebra, subsequent encounter for fracture with routine healing: Secondary | ICD-10-CM | POA: Diagnosis not present

## 2021-06-04 DIAGNOSIS — G8929 Other chronic pain: Secondary | ICD-10-CM | POA: Diagnosis not present

## 2021-06-04 DIAGNOSIS — R69 Illness, unspecified: Secondary | ICD-10-CM | POA: Diagnosis not present

## 2021-06-04 DIAGNOSIS — R739 Hyperglycemia, unspecified: Secondary | ICD-10-CM

## 2021-06-04 LAB — HM DIABETES EYE EXAM

## 2021-06-04 MED ORDER — CYCLOBENZAPRINE HCL 5 MG PO TABS: 5.0000 mg | ORAL_TABLET | Freq: Three times a day (TID) | ORAL | 2 refills | Status: AC | PRN

## 2021-06-04 NOTE — Progress Notes (Signed)
Patient ID: Denise King, female   DOB: 02-20-1951, 70 y.o.   MRN: [id number]         Chief Complaint: compression fx T4 and t7       HPI:  Denise King is a 70 y.o. female here with c/o 2 thoracic compression fx T4, then more recently T7 with ED visit may 28 for thoracic pain; proven with MRI T spine May 24 2021 ; pt decliens further pain med due to pain contract;  Pt denies polydipsia, polyuria, or new focal neuro s/s.  Pt denies chest pain, increased sob or doe, wheezing, orthopnea, PND, increased LE swelling, palpitations, dizziness or syncope.   Pt denies fever, wt loss, night sweats, loss of appetite, or other constitutional symptoms  Denies worsening depressive symptoms, suicidal ideation, or panic;       Wt Readings from Last 3 Encounters:  06/04/21 112 lb (50.8 kg)  05/24/21 132 lb 4.4 oz (60 kg)  05/17/21 112 lb (50.8 kg)   BP Readings from Last 3 Encounters:  06/04/21 138/76  05/24/21 123/60  05/22/21 117/65         Past Medical History:  Diagnosis Date   Acute cystitis    Acute respiratory failure ([place])    Allergy    as a child grew out of them   Anemia    pernicious anemia   Anxiety    Arthritis    Back pain    Blood transfusion    CAP (community acquired pneumonia)    CHF (congestive heart failure) (HCC)    Chronic pain syndrome    Common bile duct stone    Depression    Diverticulosis of colon    Duodenal ulcer hemorrhagic-after biliary sphincterotomy    Dysthymia    Esophagitis    EtOH dependence ([place])    Gastritis    GERD (gastroesophageal reflux disease)    H/O chest pain Dec. 2013   no work up done   Hx of colonic polyps    Hypertension    Irritable bowel syndrome    Lymphocytic colitis 02/16/2020   Osteopenia    Osteoporosis    Pneumonia 2019   Polypharmacy    Reflux esophagitis    Right sided sciatica 12/22/2017   Tobacco use disorder    Unspecified chronic bronchitis (HCC)    Vertigo    Vitamin B12 deficiency    Wears glasses     Past Surgical History:  Procedure Laterality Date   APPENDECTOMY     BACK SURGERY  10-09   Dr. Patrice Paradise   BIOPSY  02/14/2020   Procedure: BIOPSY;  Surgeon: Gatha Mayer, MD;  Location: [facility];  Service: Endoscopy;;   CARPAL TUNNEL RELEASE Right 08/14/2014   Procedure: RIGHT CARPAL TUNNEL RELEASE AND INJECT LEFT THUMB;  Surgeon: Daryll Brod, MD;  Location: [place];  Service: Orthopedics;  Laterality: Right;   CHOLECYSTECTOMY N/A 02/22/2020   Procedure: LAPAROSCOPIC CHOLECYSTECTOMY WITH INTRAOPERATIVE CHOLANGIOGRAM;  Surgeon: Kieth Brightly Arta Bruce, MD;  Location: [facility];  Service: General;  Laterality: N/A;   COLONOSCOPY     COLONOSCOPY WITH PROPOFOL N/A 02/14/2020   Procedure: COLONOSCOPY WITH PROPOFOL;  Surgeon: Gatha Mayer, MD;  Location: [facility];  Service: Endoscopy;  Laterality: N/A;   ENDOSCOPIC RETROGRADE CHOLANGIOPANCREATOGRAPHY (ERCP) WITH PROPOFOL N/A 12/25/2019   Procedure: ENDOSCOPIC RETROGRADE CHOLANGIOPANCREATOGRAPHY (ERCP) WITH PROPOFOL;  Surgeon: Gatha Mayer, MD;  Location: [place];  Service: Gastroenterology;  Laterality: N/A;   ESOPHAGOGASTRODUODENOSCOPY (EGD) WITH PROPOFOL  N/A 01/06/2020   Procedure: ESOPHAGOGASTRODUODENOSCOPY (EGD) WITH PROPOFOL;  Surgeon: Irene Shipper, MD;  Location: [facility];  Service: Endoscopy;  Laterality: N/A;   HOT HEMOSTASIS N/A 01/06/2020   Procedure: HOT HEMOSTASIS (ARGON PLASMA COAGULATION/BICAP);  Surgeon: Irene Shipper, MD;  Location: [facility];  Service: Endoscopy;  Laterality: N/A;   INTRAMEDULLARY (IM) NAIL INTERTROCHANTERIC Right 10/01/2018   Procedure: INTRAMEDULLARY (IM) NAIL INTERTROCHANTRIC;  Surgeon: Thornton Park, MD;  Location: [facility];  Service: Orthopedics;  Laterality: Right;   LAPAROSCOPY N/A 02/21/2013   Procedure: LAPAROSCOPY OPERATIVE;  Surgeon: Margarette Asal, MD;  Location: [facility];  Service: Gynecology;  Laterality: N/A;  REQUESTING 5MM SCOPE WITH CAMERA   LEFT HEART CATH AND  CORONARY ANGIOGRAPHY N/A 05/11/2019   Procedure: LEFT HEART CATH AND CORONARY ANGIOGRAPHY;  Surgeon: Sherren Mocha, MD;  Location: [place] [facility];  Service: Cardiovascular;  Laterality: N/A;   REMOVAL OF STONES  12/25/2019   Procedure: Karlyn Agee;  Surgeon: Gatha Mayer, MD;  Location: [facility] ENDOSCOPY;  Service: Gastroenterology;;   Clide Deutscher  01/06/2020   Procedure: Clide Deutscher;  Surgeon: Irene Shipper, MD;  Location: [facility] ENDOSCOPY;  Service: Endoscopy;;   SPHINCTEROTOMY  12/25/2019   Procedure: Joan Mayans;  Surgeon: Gatha Mayer, MD;  Location: [place];  Service: Gastroenterology;;   TONSILLECTOMY     TUBAL LIGATION     UPPER GASTROINTESTINAL ENDOSCOPY      reports that she has been smoking cigarettes. She has a 30.00 pack-year smoking history. She has never used smokeless tobacco. She reports current alcohol use of about 3.0 standard drinks of alcohol per week. She reports that she does not use drugs. family history includes Alcohol abuse in her daughter; Bipolar disorder in her daughter; Colon cancer in her brother and father; Drug abuse in her daughter; Heart disease in her mother; Prostate cancer in her father; Skin cancer in her sister. Allergies  Allergen Reactions   Atorvastatin Nausea And Vomiting   Levofloxacin Other (See Comments)    Achilles tendon pain   Omnicef [Cefdinir] Diarrhea and Other (See Comments)    Uncontrollable diarrhea   Trazodone And Nefazodone Other (See Comments)    "Felt like I was going to faint"   Sulfa Antibiotics Rash   Sulfonamide Derivatives Rash   Current Outpatient Medications on File Prior to Visit  Medication Sig Dispense Refill   albuterol (VENTOLIN HFA) 108 (90 Base) MCG/ACT inhaler Inhale 1-2 puffs into the lungs every 6 (six) hours as needed for wheezing or shortness of breath.     budesonide (ENTOCORT EC) 3 MG 24 hr capsule TAKE 3 CAPSULES ONCE DAILY. 90 capsule 1   buPROPion (WELLBUTRIN SR) 150 MG 12 hr tablet Take 1  tablet (150 mg total) by mouth daily. 90 tablet 1   denosumab (PROLIA) 60 MG/ML SOSY injection Inject 60 mg into the skin every 6 (six) months.     diclofenac Sodium (VOLTAREN) 1 % GEL Apply 2 g topically 4 (four) times daily as needed (for joint pain).     DULoxetine (CYMBALTA) 30 MG capsule Take 30 mg by mouth daily.     hyoscyamine (ANASPAZ) 0.125 MG TBDP disintergrating tablet Place 1 tablet (0.125 mg total) under the tongue every 6 (six) hours as needed ([name] cramps). 90 tablet 3   morphine (MS CONTIN) 15 MG 12 hr tablet Take 1 tablet (15 mg total) by mouth 3 (three) times daily. 30 tablet 0   nicotine (NICODERM CQ - DOSED IN MG/24 HOURS) 21 mg/24hr  patch Place 1 patch (21 mg total) onto the skin daily. 28 patch 0   nitroGLYCERIN (NITROSTAT) 0.4 MG SL tablet Place 1 tablet (0.4 mg total) under the tongue every 5 (five) minutes as needed for chest pain. 25 tablet 3   ondansetron (ZOFRAN-ODT) 4 MG disintegrating tablet DISSOLVE 1 TABLET ON TONGUE EVERY 8 HOURS AS NEEDED FOR NAUSEA/VOMITING. (Patient taking differently: Take 4 mg by mouth every 8 (eight) hours as needed for vomiting or nausea (dissolve orally).) 30 tablet 3   Oxycodone HCl 10 MG TABS Take 1 tablet (10 mg total) by mouth 4 (four) times daily as needed. (Patient taking differently: Take 10 mg by mouth every 4 (four) hours as needed (for pain).) 15 tablet 0   pantoprazole (PROTONIX) 40 MG tablet TAKE (1) TABLET TWICE A DAY BEFORE MEALS. (Patient taking differently: Take 40 mg by mouth 2 (two) times daily before a meal.) 60 tablet 11   polyethylene glycol (MIRALAX) 17 g packet Take 17 g by mouth 2 (two) times daily. 14 each 0   potassium chloride (KLOR-CON) 10 MEQ tablet Take 1 tablet (10 mEq total) by mouth daily. 30 tablet 0   rosuvastatin (CRESTOR) 5 MG tablet Take 1 tablet (5 mg total) by mouth 3 (three) times a week. (Patient taking differently: Take 5 mg by mouth every Monday, Wednesday, and Friday at 6 PM.) 39 tablet 3    sennosides-docusate sodium (SENOKOT-S) 8.6-50 MG tablet Take 2 tablets by mouth 2 (two) times daily.     sertraline (ZOLOFT) 100 MG tablet Take 2 tablets (200 mg total) by mouth at bedtime. 180 tablet 1   spironolactone (ALDACTONE) 25 MG tablet Take 0.5 tablets (12.5 mg total) by mouth daily. (Patient taking differently: Take 12.5 mg by mouth daily as needed (swelling).) 45 tablet 3   valACYclovir (VALTREX) 500 MG tablet Take 500 mg by mouth 2 (two) times daily as needed (flare ups).     sucralfate (CARAFATE) 1 g tablet Take 1 tablet (1 g total) by mouth 4 (four) times daily -  with meals and at bedtime for 14 days. (Patient taking differently: Take 1 g by mouth in the morning and at bedtime.) 56 tablet 0   No current facility-administered medications on file prior to visit.        ROS:  All others reviewed and negative.  Objective        PE:  BP 138/76 (BP Location: Left Arm, Patient Position: Sitting, Cuff Size: Normal)   Pulse 98   Temp 98.1 F (36.7 C) (Oral)   Ht 5\' 3"  (1.6 m)   Wt 112 lb (50.8 kg)   SpO2 93%   BMI 19.84 kg/m                 Constitutional: Pt appears in NAD               HENT: Head: NCAT.                Right Ear: External ear normal.                 Left Ear: External ear normal.                Eyes: . Pupils are equal, round, and reactive to light. Conjunctivae and EOM are normal               Nose: without d/c or deformity  Neck: Neck supple. Gross normal ROM               Cardiovascular: Normal rate and regular rhythm.                 Pulmonary/Chest: Effort normal and breath sounds without rales or wheezing.                Abd:  Soft, NT, ND, + BS, no organomegaly; has tender over midline t spine diffusely               Neurological: Pt is alert. At baseline orientation, motor grossly intact               Skin: Skin is warm. No rashes, no other new lesions, LE edema - none               Psychiatric: Pt behavior is normal without agitation    Micro: none  Cardiac tracings I have personally interpreted today:  none  Pertinent Radiological findings (summarize): May 24 2021 MRI t spine IMPRESSION: 1. No acute cardiopulmonary disease. 2. Mild compression fracture of a midthoracic vertebra new since the CT dated 05/05/2021   Lab Results  Component Value Date   WBC 9.1 05/24/2021   HGB 11.6 (L) 05/24/2021   HCT 36.1 05/24/2021   PLT 309 05/24/2021   GLUCOSE 93 05/24/2021   CHOL 160 05/16/2020   TRIG 153 (H) 05/16/2020   HDL 61 05/16/2020   LDLDIRECT 102.4 05/24/2008   LDLCALC 73 05/16/2020   ALT 9 05/24/2021   AST 16 05/24/2021   NA 137 05/24/2021   K 3.4 (L) 05/24/2021   CL 97 (L) 05/24/2021   CREATININE 0.78 05/24/2021   BUN 7 (L) 05/24/2021   CO2 30 05/24/2021   TSH 0.175 (L) 02/10/2021   INR 1.0 02/09/2021   HGBA1C 5.2 10/01/2018   Assessment/Plan:  Denise King is a 70 y.o. White or Caucasian [1] female with  has a past medical history of Acute cystitis, Acute respiratory failure ([place]), Allergy, Anemia, Anxiety, Arthritis, Back pain, Blood transfusion, CAP (community acquired pneumonia), CHF (congestive heart failure) ([place]), Chronic pain syndrome, Common bile duct stone, Depression, Diverticulosis of colon, Duodenal ulcer hemorrhagic-after biliary sphincterotomy, Dysthymia, Esophagitis, EtOH dependence ([place]), Gastritis, GERD (gastroesophageal reflux disease), H/O chest pain (Dec. 2013), colonic polyps, Hypertension, Irritable bowel syndrome, Lymphocytic colitis (02/16/2020), Osteopenia, Osteoporosis, Pneumonia (2019), Polypharmacy, Reflux esophagitis, Right sided sciatica (12/22/2017), Tobacco use disorder, Unspecified chronic bronchitis ([place]), Vertigo, Vitamin B12 deficiency, and Wears glasses.  Compression fracture of thoracic vertebra (HCC) Recent oenst, cont oxycodone, add salon paz prn, change robaxin to flexeril per pt request, f/u pain management, and refer NS - ? Kyphosis candidate  Anxiety Overall  stable, cont current med tx - zoloft, wellbutrin   Chronic pain To continue oxycodone, f/u pain maneagement  Hyperglycemia Lab Results  Component Value Date   HGBA1C 5.2 10/01/2018   Stable, pt to continue current medical treatment  - diet  Followup: Return if symptoms worsen or fail to improve.  Cathlean Cower, MD 06/09/2021 10:01 PM [facility]

## 2021-06-04 NOTE — Patient Instructions (Signed)
Please take all new medication as prescribed - the flexeril as needed for spasms  Ok to stop the methocarbamol  Please continue all other medications as before, and refills have been done if requested.  Please have the pharmacy call with any other refills you may need.  Please keep your appointments with your specialists as you may have planned  You will be contacted regarding the referral for: Neurosurg[facility]

## 2021-06-05 ENCOUNTER — Encounter: Payer: Self-pay | Admitting: Internal Medicine

## 2021-06-09 ENCOUNTER — Encounter: Payer: Self-pay | Admitting: Internal Medicine

## 2021-06-09 DIAGNOSIS — Z79899 Other long term (current) drug therapy: Secondary | ICD-10-CM | POA: Diagnosis not present

## 2021-06-09 DIAGNOSIS — I509 Heart failure, unspecified: Secondary | ICD-10-CM | POA: Diagnosis not present

## 2021-06-09 NOTE — Assessment & Plan Note (Signed)
Overall stable, cont current med tx - zoloft, wellbutrin

## 2021-06-09 NOTE — Assessment & Plan Note (Signed)
Recent oenst, cont oxycodone, add salon paz prn, change robaxin to flexeril per pt request, f/u pain management, and refer NS - ? Kyphosis candidate

## 2021-06-09 NOTE — Assessment & Plan Note (Signed)
Lab Results  Component Value Date   HGBA1C 5.2 10/01/2018   Stable, pt to continue current medical treatment  - diet

## 2021-06-09 NOTE — Assessment & Plan Note (Signed)
To continue oxycodone, f/u pain maneagement

## 2021-06-11 DIAGNOSIS — Z1231 Encounter for screening mammogram for malignant neoplasm of breast: Secondary | ICD-10-CM | POA: Diagnosis not present

## 2021-06-11 DIAGNOSIS — Z6820 Body mass index (BMI) 20.0-20.9, adult: Secondary | ICD-10-CM | POA: Diagnosis not present

## 2021-06-11 DIAGNOSIS — Z01419 Encounter for gynecological examination (general) (routine) without abnormal findings: Secondary | ICD-10-CM | POA: Diagnosis not present

## 2021-06-11 DIAGNOSIS — M816 Localized osteoporosis [Lequesne]: Secondary | ICD-10-CM | POA: Diagnosis not present

## 2021-06-11 LAB — HM MAMMOGRAPHY

## 2021-06-11 LAB — HM DEXA SCAN

## 2021-06-13 DIAGNOSIS — F17200 Nicotine dependence, unspecified, uncomplicated: Secondary | ICD-10-CM | POA: Diagnosis not present

## 2021-06-13 DIAGNOSIS — M549 Dorsalgia, unspecified: Secondary | ICD-10-CM | POA: Diagnosis not present

## 2021-06-13 DIAGNOSIS — F17210 Nicotine dependence, cigarettes, uncomplicated: Secondary | ICD-10-CM | POA: Diagnosis not present

## 2021-06-13 DIAGNOSIS — M25561 Pain in right knee: Secondary | ICD-10-CM | POA: Diagnosis not present

## 2021-06-13 DIAGNOSIS — R69 Illness, unspecified: Secondary | ICD-10-CM | POA: Diagnosis not present

## 2021-06-13 DIAGNOSIS — Z79899 Other long term (current) drug therapy: Secondary | ICD-10-CM | POA: Diagnosis not present

## 2021-06-13 DIAGNOSIS — S22000S Wedge compression fracture of unspecified thoracic vertebra, sequela: Secondary | ICD-10-CM | POA: Diagnosis not present

## 2021-06-17 ENCOUNTER — Encounter: Payer: Self-pay | Admitting: Internal Medicine

## 2021-06-17 DIAGNOSIS — Z79899 Other long term (current) drug therapy: Secondary | ICD-10-CM | POA: Diagnosis not present

## 2021-06-18 ENCOUNTER — Encounter: Payer: Self-pay | Admitting: Internal Medicine

## 2021-06-22 ENCOUNTER — Observation Stay (HOSPITAL_COMMUNITY): Payer: Medicare HMO

## 2021-06-22 ENCOUNTER — Other Ambulatory Visit: Payer: Self-pay

## 2021-06-22 ENCOUNTER — Ambulatory Visit (HOSPITAL_COMMUNITY)
Admission: EM | Admit: 2021-06-22 | Discharge: 2021-06-22 | Disposition: A | Discharge status: Home / Self Care | Payer: Medicare HMO

## 2021-06-22 ENCOUNTER — Inpatient Hospital Stay (HOSPITAL_COMMUNITY)
Admission: EM | Admit: 2021-06-22 | Discharge: 2021-06-24 | DRG: 917 | Disposition: A | Discharge status: Home Health | Payer: Medicare HMO | Attending: Internal Medicine | Admitting: Internal Medicine

## 2021-06-22 ENCOUNTER — Emergency Department (HOSPITAL_COMMUNITY): Payer: Medicare HMO

## 2021-06-22 ENCOUNTER — Encounter (HOSPITAL_COMMUNITY): Payer: Self-pay | Admitting: *Deleted

## 2021-06-22 DIAGNOSIS — J9811 Atelectasis: Secondary | ICD-10-CM | POA: Diagnosis not present

## 2021-06-22 DIAGNOSIS — J449 Chronic obstructive pulmonary disease, unspecified: Secondary | ICD-10-CM | POA: Diagnosis present

## 2021-06-22 DIAGNOSIS — J9622 Acute and chronic respiratory failure with hypercapnia: Secondary | ICD-10-CM

## 2021-06-22 DIAGNOSIS — J9620 Acute and chronic respiratory failure, unspecified whether with hypoxia or hypercapnia: Secondary | ICD-10-CM | POA: Diagnosis present

## 2021-06-22 DIAGNOSIS — Z800 Family history of malignant neoplasm of digestive organs: Secondary | ICD-10-CM

## 2021-06-22 DIAGNOSIS — Z9981 Dependence on supplemental oxygen: Secondary | ICD-10-CM

## 2021-06-22 DIAGNOSIS — I509 Heart failure, unspecified: Secondary | ICD-10-CM

## 2021-06-22 DIAGNOSIS — Z808 Family history of malignant neoplasm of other organs or systems: Secondary | ICD-10-CM

## 2021-06-22 DIAGNOSIS — Z20822 Contact with and (suspected) exposure to covid-19: Secondary | ICD-10-CM | POA: Diagnosis present

## 2021-06-22 DIAGNOSIS — Z79899 Other long term (current) drug therapy: Secondary | ICD-10-CM

## 2021-06-22 DIAGNOSIS — G9341 Metabolic encephalopathy: Secondary | ICD-10-CM | POA: Diagnosis not present

## 2021-06-22 DIAGNOSIS — K219 Gastro-esophageal reflux disease without esophagitis: Secondary | ICD-10-CM | POA: Diagnosis present

## 2021-06-22 DIAGNOSIS — I5032 Chronic diastolic (congestive) heart failure: Secondary | ICD-10-CM | POA: Diagnosis present

## 2021-06-22 DIAGNOSIS — T402X1A Poisoning by other opioids, accidental (unintentional), initial encounter: Principal | ICD-10-CM | POA: Diagnosis present

## 2021-06-22 DIAGNOSIS — R4182 Altered mental status, unspecified: Secondary | ICD-10-CM | POA: Diagnosis not present

## 2021-06-22 DIAGNOSIS — R0602 Shortness of breath: Secondary | ICD-10-CM

## 2021-06-22 DIAGNOSIS — E785 Hyperlipidemia, unspecified: Secondary | ICD-10-CM | POA: Diagnosis present

## 2021-06-22 DIAGNOSIS — F17210 Nicotine dependence, cigarettes, uncomplicated: Secondary | ICD-10-CM | POA: Diagnosis present

## 2021-06-22 DIAGNOSIS — F419 Anxiety disorder, unspecified: Secondary | ICD-10-CM | POA: Diagnosis present

## 2021-06-22 DIAGNOSIS — R079 Chest pain, unspecified: Secondary | ICD-10-CM | POA: Diagnosis not present

## 2021-06-22 DIAGNOSIS — E860 Dehydration: Secondary | ICD-10-CM | POA: Diagnosis present

## 2021-06-22 DIAGNOSIS — Z743 Need for continuous supervision: Secondary | ICD-10-CM | POA: Diagnosis not present

## 2021-06-22 DIAGNOSIS — Z720 Tobacco use: Secondary | ICD-10-CM | POA: Diagnosis present

## 2021-06-22 DIAGNOSIS — S299XXA Unspecified injury of thorax, initial encounter: Secondary | ICD-10-CM | POA: Diagnosis not present

## 2021-06-22 DIAGNOSIS — R296 Repeated falls: Secondary | ICD-10-CM | POA: Diagnosis present

## 2021-06-22 DIAGNOSIS — R0781 Pleurodynia: Secondary | ICD-10-CM | POA: Diagnosis not present

## 2021-06-22 DIAGNOSIS — I110 Hypertensive heart disease with heart failure: Secondary | ICD-10-CM | POA: Diagnosis present

## 2021-06-22 DIAGNOSIS — R0902 Hypoxemia: Secondary | ICD-10-CM | POA: Diagnosis not present

## 2021-06-22 DIAGNOSIS — J42 Unspecified chronic bronchitis: Secondary | ICD-10-CM

## 2021-06-22 DIAGNOSIS — J9621 Acute and chronic respiratory failure with hypoxia: Secondary | ICD-10-CM | POA: Diagnosis not present

## 2021-06-22 DIAGNOSIS — J9601 Acute respiratory failure with hypoxia: Secondary | ICD-10-CM

## 2021-06-22 DIAGNOSIS — R410 Disorientation, unspecified: Secondary | ICD-10-CM | POA: Diagnosis not present

## 2021-06-22 DIAGNOSIS — S22069A Unspecified fracture of T7-T8 vertebra, initial encounter for closed fracture: Secondary | ICD-10-CM | POA: Diagnosis present

## 2021-06-22 DIAGNOSIS — Z28310 Unvaccinated for covid-19: Secondary | ICD-10-CM

## 2021-06-22 DIAGNOSIS — R000 Tachycardia, unspecified: Secondary | ICD-10-CM | POA: Diagnosis not present

## 2021-06-22 DIAGNOSIS — G319 Degenerative disease of nervous system, unspecified: Secondary | ICD-10-CM | POA: Diagnosis not present

## 2021-06-22 DIAGNOSIS — I5033 Acute on chronic diastolic (congestive) heart failure: Secondary | ICD-10-CM

## 2021-06-22 DIAGNOSIS — Z79891 Long term (current) use of opiate analgesic: Secondary | ICD-10-CM

## 2021-06-22 DIAGNOSIS — R0789 Other chest pain: Secondary | ICD-10-CM | POA: Diagnosis not present

## 2021-06-22 DIAGNOSIS — E538 Deficiency of other specified B group vitamins: Secondary | ICD-10-CM | POA: Diagnosis present

## 2021-06-22 DIAGNOSIS — Z8249 Family history of ischemic heart disease and other diseases of the circulatory system: Secondary | ICD-10-CM

## 2021-06-22 DIAGNOSIS — G894 Chronic pain syndrome: Secondary | ICD-10-CM | POA: Diagnosis present

## 2021-06-22 DIAGNOSIS — T426X1A Poisoning by other antiepileptic and sedative-hypnotic drugs, accidental (unintentional), initial encounter: Secondary | ICD-10-CM | POA: Diagnosis present

## 2021-06-22 LAB — I-STAT VENOUS BLOOD GAS, ED
Acid-Base Excess: 2 mmol/L (ref 0.0–2.0)
Bicarbonate: 29 mmol/L — ABNORMAL HIGH (ref 20.0–28.0)
Calcium, Ion: 0.97 mmol/L — ABNORMAL LOW (ref 1.15–1.40)
HCT: 39 % (ref 36.0–46.0)
Hemoglobin: 13.3 g/dL (ref 12.0–15.0)
O2 Saturation: 92 %
Potassium: 4.4 mmol/L (ref 3.5–5.1)
Sodium: 139 mmol/L (ref 135–145)
TCO2: 31 mmol/L (ref 22–32)
pCO2, Ven: 55.5 mmHg (ref 44.0–60.0)
pH, Ven: 7.326 (ref 7.250–7.430)
pO2, Ven: 72 mmHg — ABNORMAL HIGH (ref 32.0–45.0)

## 2021-06-22 LAB — URINALYSIS, ROUTINE W REFLEX MICROSCOPIC
Bilirubin Urine: NEGATIVE
Glucose, UA: NEGATIVE mg/dL
Hgb urine dipstick: NEGATIVE
Ketones, ur: NEGATIVE mg/dL
Leukocytes,Ua: NEGATIVE
Nitrite: NEGATIVE
Protein, ur: NEGATIVE mg/dL
Specific Gravity, Urine: 1.006 (ref 1.005–1.030)
pH: 6 (ref 5.0–8.0)

## 2021-06-22 LAB — CBC
HCT: 38 % (ref 36.0–46.0)
Hemoglobin: 12.1 g/dL (ref 12.0–15.0)
MCH: 31.2 pg (ref 26.0–34.0)
MCHC: 31.8 g/dL (ref 30.0–36.0)
MCV: 97.9 fL (ref 80.0–100.0)
Platelets: 290 10*3/uL (ref 150–400)
RBC: 3.88 MIL/uL (ref 3.87–5.11)
RDW: 13 % (ref 11.5–15.5)
WBC: 9.6 10*3/uL (ref 4.0–10.5)
nRBC: 0 % (ref 0.0–0.2)

## 2021-06-22 LAB — HEPATIC FUNCTION PANEL
ALT: 9 U/L (ref 0–44)
AST: 18 U/L (ref 15–41)
Albumin: 3.4 g/dL — ABNORMAL LOW (ref 3.5–5.0)
Alkaline Phosphatase: 64 U/L (ref 38–126)
Bilirubin, Direct: <0.1 mg/dL (ref 0.0–0.2)
Total Bilirubin: 0.4 mg/dL (ref 0.3–1.2)
Total Protein: 6.2 g/dL — ABNORMAL LOW (ref 6.5–8.1)

## 2021-06-22 LAB — BLOOD GAS, VENOUS
Acid-Base Excess: 1.1 mmol/L (ref 0.0–2.0)
Bicarbonate: 28 mmol/L (ref 20.0–28.0)
Drawn by: 6344
O2 Saturation: 35.1 %
Patient temperature: 37
pCO2, Ven: 69.5 mmHg — ABNORMAL HIGH (ref 44.0–60.0)
pH, Ven: 7.228 — ABNORMAL LOW (ref 7.250–7.430)
pO2, Ven: <31 mmHg — CL (ref 32.0–45.0)

## 2021-06-22 LAB — TROPONIN I (HIGH SENSITIVITY): Troponin I (High Sensitivity): 91 ng/L — ABNORMAL HIGH (ref <18)

## 2021-06-22 LAB — BASIC METABOLIC PANEL
Anion gap: 8 (ref 5–15)
BUN: 7 mg/dL — ABNORMAL LOW (ref 8–23)
CO2: 25 mmol/L (ref 22–32)
Calcium: 7.5 mg/dL — ABNORMAL LOW (ref 8.9–10.3)
Chloride: 103 mmol/L (ref 98–111)
Creatinine, Ser: 0.76 mg/dL (ref 0.44–1.00)
GFR, Estimated: >60 mL/min (ref >60)
Glucose, Bld: 109 mg/dL — ABNORMAL HIGH (ref 70–99)
Potassium: 4.1 mmol/L (ref 3.5–5.1)
Sodium: 136 mmol/L (ref 135–145)

## 2021-06-22 LAB — ETHANOL: Alcohol, Ethyl (B): <10 mg/dL (ref <10)

## 2021-06-22 LAB — MAGNESIUM: Magnesium: 1.8 mg/dL (ref 1.7–2.4)

## 2021-06-22 LAB — RAPID URINE DRUG SCREEN, HOSP PERFORMED
Amphetamines: NOT DETECTED
Barbiturates: NOT DETECTED
Benzodiazepines: NOT DETECTED
Cocaine: NOT DETECTED
Opiates: POSITIVE — AB
Tetrahydrocannabinol: NOT DETECTED

## 2021-06-22 LAB — CK: Total CK: 67 U/L (ref 38–234)

## 2021-06-22 LAB — RESP PANEL BY RT-PCR (FLU A&B, COVID) ARPGX2
Influenza A by PCR: NEGATIVE
Influenza B by PCR: NEGATIVE
SARS Coronavirus 2 by RT PCR: NEGATIVE

## 2021-06-22 LAB — D-DIMER, QUANTITATIVE: D-Dimer, Quant: 0.66 ug/mL-FEU — ABNORMAL HIGH (ref 0.00–0.50)

## 2021-06-22 LAB — CBG MONITORING, ED: Glucose-Capillary: 116 mg/dL — ABNORMAL HIGH (ref 70–99)

## 2021-06-22 LAB — AMMONIA: Ammonia: 27 umol/L (ref 9–35)

## 2021-06-22 LAB — BRAIN NATRIURETIC PEPTIDE: B Natriuretic Peptide: 54.3 pg/mL (ref 0.0–100.0)

## 2021-06-22 MED ORDER — IOHEXOL 350 MG/ML SOLN
50.0000 mL | Freq: Once | INTRAVENOUS | Status: AC | PRN
  Administered 2021-06-22: 50 mL via INTRAVENOUS

## 2021-06-22 MED ORDER — ACETAMINOPHEN 650 MG RE SUPP: 650.0000 mg | Freq: Four times a day (QID) | RECTAL | Status: DC | PRN

## 2021-06-22 MED ORDER — ENOXAPARIN SODIUM 40 MG/0.4ML IJ SOSY
40.0000 mg | PREFILLED_SYRINGE | Freq: Every day | INTRAMUSCULAR | Status: DC
  Administered 2021-06-22 – 2021-06-23 (×2): 40 mg via SUBCUTANEOUS
  Filled 2021-06-22 (×2): qty 0.4

## 2021-06-22 MED ORDER — SERTRALINE HCL 100 MG PO TABS
200.0000 mg | ORAL_TABLET | Freq: Every day | ORAL | Status: DC
  Administered 2021-06-22 – 2021-06-23 (×2): 200 mg via ORAL
  Filled 2021-06-22 (×2): qty 2

## 2021-06-22 MED ORDER — BUPROPION HCL ER (SR) 150 MG PO TB12
150.0000 mg | ORAL_TABLET | Freq: Every day | ORAL | Status: DC
  Administered 2021-06-23 – 2021-06-24 (×2): 150 mg via ORAL
  Filled 2021-06-22 (×2): qty 1

## 2021-06-22 MED ORDER — PANTOPRAZOLE SODIUM 40 MG PO TBEC
40.0000 mg | DELAYED_RELEASE_TABLET | Freq: Two times a day (BID) | ORAL | Status: DC
  Administered 2021-06-23 – 2021-06-24 (×3): 40 mg via ORAL
  Filled 2021-06-22 (×4): qty 1

## 2021-06-22 MED ORDER — BUDESONIDE 3 MG PO CPEP
9.0000 mg | ORAL_CAPSULE | Freq: Every day | ORAL | Status: DC
  Administered 2021-06-23 – 2021-06-24 (×2): 9 mg via ORAL
  Filled 2021-06-22 (×2): qty 3

## 2021-06-22 MED ORDER — HYDROCODONE-ACETAMINOPHEN 5-325 MG PO TABS
1.0000 | ORAL_TABLET | ORAL | Status: DC | PRN
  Administered 2021-06-22: 1 via ORAL
  Filled 2021-06-22: qty 1

## 2021-06-22 MED ORDER — ROSUVASTATIN CALCIUM 5 MG PO TABS
5.0000 mg | ORAL_TABLET | ORAL | Status: DC
  Administered 2021-06-23: 5 mg via ORAL
  Filled 2021-06-22: qty 1

## 2021-06-22 MED ORDER — SENNOSIDES-DOCUSATE SODIUM 8.6-50 MG PO TABS
2.0000 | ORAL_TABLET | Freq: Two times a day (BID) | ORAL | Status: DC
  Administered 2021-06-22 – 2021-06-23 (×2): 2 via ORAL
  Filled 2021-06-22 (×2): qty 2

## 2021-06-22 MED ORDER — DICLOFENAC EPOLAMINE 1.3 % EX PTCH
1.0000 | MEDICATED_PATCH | Freq: Two times a day (BID) | CUTANEOUS | Status: DC
  Administered 2021-06-22 – 2021-06-24 (×3): 1 via TRANSDERMAL
  Filled 2021-06-22 (×4): qty 1

## 2021-06-22 MED ORDER — SODIUM CHLORIDE 0.9 % IV SOLN
75.0000 mL/h | INTRAVENOUS | Status: DC
  Administered 2021-06-22: 75 mL/h via INTRAVENOUS

## 2021-06-22 MED ORDER — ACETAMINOPHEN 325 MG PO TABS
650.0000 mg | ORAL_TABLET | Freq: Four times a day (QID) | ORAL | Status: DC | PRN
  Administered 2021-06-23 – 2021-06-24 (×3): 650 mg via ORAL
  Filled 2021-06-22 (×3): qty 2

## 2021-06-22 MED ORDER — NICOTINE 21 MG/24HR TD PT24
21.0000 mg | MEDICATED_PATCH | Freq: Every day | TRANSDERMAL | Status: DC
  Administered 2021-06-23 – 2021-06-24 (×2): 21 mg via TRANSDERMAL
  Filled 2021-06-22 (×2): qty 1

## 2021-06-22 MED ORDER — ALBUTEROL SULFATE (2.5 MG/3ML) 0.083% IN NEBU: 2.5000 mg | INHALATION_SOLUTION | RESPIRATORY_TRACT | Status: DC | PRN

## 2021-06-22 MED ORDER — KETOROLAC TROMETHAMINE 15 MG/ML IJ SOLN
15.0000 mg | Freq: Three times a day (TID) | INTRAMUSCULAR | Status: DC | PRN
  Administered 2021-06-23 – 2021-06-24 (×3): 15 mg via INTRAVENOUS
  Filled 2021-06-22 (×3): qty 1

## 2021-06-22 MED ORDER — ALBUTEROL SULFATE HFA 108 (90 BASE) MCG/ACT IN AERS
2.0000 | INHALATION_SPRAY | RESPIRATORY_TRACT | Status: DC | PRN
  Filled 2021-06-22: qty 6.7

## 2021-06-22 NOTE — H&P (Signed)
Denise King HLK:[id number] DOB: 04/10/1951 DOA: 06/22/2021     PCP: Biagio Borg, MD   Outpatient Specialists:    GI Dr. Carlean Purl      Patient arrived to ER on 06/22/21 at 1426 Referred by Attending Carmin Muskrat, MD   Patient coming from: home Lives With family    Chief Complaint:   Chief Complaint  Patient presents with   Shortness of Breath    HPI: Denise King is a 70 y.o. female with medical history significant of COPD on home oxygen 2 L as needed, CHF    Presented with   shortness of breath and confusion.  She has had frequent falls  Has been chronically on opioids 2 weeks ago fall downstairs hit his left side of her torso.  Has had progressive left-sided chest wall rib pain. She is not sure how she fell, reports had severe paina nd could not sleep all night now feels extra sleepy Today daughter noted that she was disoriented and confused. At home usually her pulse ox in the low 90s and she uses oxygen only as needed.  Not all the time.  Last time seen normal was 6 PM yesterday 's found to have oxygen saturation down to 84% on room air [facility] oxygen increased to 3 L and she was doing better.  She was sent to emergency department   Infectious risk factors:  Reports   chest pain,      Has  NOt been vaccinated against COVID and boosted   Initial COVID TEST  NEGATIVE   Lab Results  Component Value Date   SARSCOV2NAA NEGATIVE 06/22/2021   [place] NEGATIVE 02/20/2021   [place] NEGATIVE 02/09/2021   [place] NEGATIVE 07/04/2020     Regarding pertinent Chronic problems:     Hyperlipidemia -  on statins Crestor Lipid Panel     Component Value Date/Time   CHOL 160 05/16/2020 0815   TRIG 153 (H) 05/16/2020 0815   HDL 61 05/16/2020 0815   CHOLHDL 2.6 05/16/2020 0815   CHOLHDL 2.7 04/09/2019 0615   VLDL 10 04/09/2019 0615   LDLCALC 73 05/16/2020 0815   LDLDIRECT 102.4 05/24/2008 1037   LABVLDL 26  05/16/2020 0815     HTN on spironolactone   chronic CHF diastolic/systolic/ combined - last echoJuly 2021 with Grade I diastolic Right ventricular systolic function is normal. The right ventricular  size is normal.       COPD - not  followed by pulmonology   on baseline oxygen  2L PRN    Chronic anemia - baseline hg Hemoglobin & Hematocrit  Recent Labs    05/17/21 0142 05/24/21 0644 06/22/21 1446  HGB 11.6* 11.6* 12.1     While in ER: CXR no rib tractures or pNA CT head neg  CT chest showed T8 fracture but no rib frx  CTA showed Right lower lobe atelectasis or infiltrate. ED Triage Vitals  Enc Vitals Group     BP 06/22/21 1428 (!) 113/98     Pulse Rate 06/22/21 1428 97     Resp 06/22/21 1428 18     Temp --      Temp src --      SpO2 06/22/21 1428 93 %     Weight 06/22/21 1443 112 lb (50.8 kg)     Height --      Head Circumference --      Peak Flow --      Pain Score 06/22/21  1443 8     Pain Loc --      Pain Edu? --      Excl. in [place]? --   TMAX(24)@     _________________________________________ Significant initial  Findings: Abnormal Labs Reviewed  BASIC METABOLIC PANEL - Abnormal; Notable for the following components:      Result Value   Glucose, Bld 109 (*)    BUN 7 (*)    Calcium 7.5 (*)    All other components within normal limits  HEPATIC FUNCTION PANEL - Abnormal; Notable for the following components:   Total Protein 6.2 (*)    Albumin 3.4 (*)    All other components within normal limits  CBG MONITORING, ED - Abnormal; Notable for the following components:   Glucose-Capillary 116 (*)    All other components within normal limits   ____________________________________________ Ordered CT HEAD   NON acute  CXR -  NON acute   Thoracic spine compression fracture   ECG: Ordered Personally reviewed by me showing: HR : 106 Rhythm:  Sinus tachycardia    no evidence of ischemic changes QTC 406    The recent clinical data is shown below. Vitals:    06/22/21 1930 06/22/21 1945 06/22/21 2000 06/22/21 2015  BP: (!) 108/95 126/72 132/85 132/90  Pulse: 91 89 92 94  Resp: (!) 35 12 15 (!) 23  SpO2: 97% 98% 98% 91%  Weight:          WBC     Component Value Date/Time   WBC 9.6 06/22/2021 1446   LYMPHSABS 0.7 05/05/2021 1635   LYMPHSABS 3.3 (H) 05/09/2019 1551   MONOABS 0.0 (L) 05/05/2021 1635   EOSABS 0.0 05/05/2021 1635   EOSABS 0.0 05/09/2019 1551   BASOSABS 0.0 05/05/2021 1635   BASOSABS 0.0 05/09/2019 1551     Lactic Acid, Venous    Component Value Date/Time   LATICACIDVEN 0.7 05/24/2021 0944      UA no evidence of UTI      Urine analysis:    Component Value Date/Time   COLORURINE YELLOW 06/22/2021 [place] 06/22/2021 1533   LABSPEC 1.006 06/22/2021 1533   PHURINE 6.0 06/22/2021 1533   [place] 06/22/2021 1533   GLUCOSEU NEGATIVE 03/12/2021 1639   HGBUR NEGATIVE 06/22/2021 1533   BILIRUBINUR NEGATIVE 06/22/2021 1533   BILIRUBINUR neg 02/07/2016 1627   KETONESUR NEGATIVE 06/22/2021 1533   PROTEINUR NEGATIVE 06/22/2021 1533   UROBILINOGEN 0.2 03/12/2021 1639   NITRITE NEGATIVE 06/22/2021 1533   [place] 06/22/2021 1533    Results for orders placed or performed during the hospital encounter of 06/22/21  Resp Panel by RT-PCR (Flu A&B, Covid) Nasopharyngeal Swab     Status: None   Collection Time: 06/22/21  3:33 PM   Specimen: Nasopharyngeal Swab; Nasopharyngeal(NP) swabs in vial transport medium  Result Value Ref Range Status   SARS Coronavirus 2 by RT PCR NEGATIVE NEGATIVE Final         Influenza A by PCR NEGATIVE NEGATIVE Final   Influenza B by PCR NEGATIVE NEGATIVE Final           _______________________________________________ Hospitalist was called for admission for acute encephalopathy acute on chronic respiratory failure  The following Work up has been ordered so far:  Orders Placed This Encounter  Procedures   Urine culture   Resp Panel by RT-PCR (Flu  A&B, Covid) Nasopharyngeal Swab   DG Ribs Unilateral W/Chest Left   CT Head Wo Contrast   CT Chest  Wo Contrast   CBC   Basic metabolic panel   Magnesium   Brain natriuretic peptide (order ONLY if patient c/o SOB)   Urinalysis, Routine w reflex microscopic   Rapid urine drug screen (hospital performed)   Hepatic function panel   Ethanol   Cardiac monitoring   Utilize spacer/aerochamber with mdi inhaler for COVID-19 positive patients or PUI for COVID-19   If O2 Sat <94% administer O2 at 2 liters/minute via nasal cannula   Cardiac monitoring   Strict intake and output   Initiate Carrier Fluid Protocol   In and Out Cath   Consult to hospitalist   Airborne and Contact precautions   Pulse oximetry, continuous   Adult wheeze protocol - initiate by RT   Pulse oximetry, continuous   POC CBG, ED   EKG 12-Lead   ED EKG   Insert peripheral IV     Following Medications were ordered in ER: Medications  albuterol (VENTOLIN HFA) 108 (90 Base) MCG/ACT inhaler 2 puff (has no administration in time range)  diclofenac (FLECTOR) 1.3 % 1 patch (has no administration in time range)        Consult Orders  (From admission, onward)           Start     Ordered   06/22/21 2018  Consult to hospitalist  Paged by Jerene Pitch  Once       Provider:  (Not yet assigned)  Question Answer Comment  Place call to: [facility]   Reason for Consult Admit      06/22/21 2017             OTHER Significant initial  Findings:  labs showing:    Recent Labs  Lab 06/22/21 1446 06/22/21 1532  NA 136  --   K 4.1  --   CO2 25  --   GLUCOSE 109*  --   BUN 7*  --   CREATININE 0.76  --   CALCIUM 7.5*  --   MG  --  1.8    Cr  * stable,  Up from baseline see below Lab Results  Component Value Date   CREATININE 0.76 06/22/2021   CREATININE 0.78 05/24/2021   CREATININE 0.75 05/17/2021    Recent Labs  Lab 06/22/21 1532  AST 18  ALT 9  ALKPHOS 64  BILITOT 0.4  PROT 6.2*  ALBUMIN  3.4*   Lab Results  Component Value Date   CALCIUM 7.5 (L) 06/22/2021   PHOS 4.8 (H) 02/20/2021          Plt: Lab Results  Component Value Date   PLT 290 06/22/2021      COVID-19 Labs  Recent Labs    06/22/21 2300  DDIMER 0.66*    Lab Results  Component Value Date   SARSCOV2NAA NEGATIVE 06/22/2021   SARSCOV2NAA NEGATIVE 02/20/2021   SARSCOV2NAA NEGATIVE 02/09/2021   SARSCOV2NAA NEGATIVE 07/04/2020      Venous  Blood Gas result:  pH 7.228 pCO2 69.2 ;       Recent Labs  Lab 06/22/21 1446  WBC 9.6  HGB 12.1  HCT 38.0  MCV 97.9  PLT 290    HG/HCT  stable,      Component Value Date/Time   HGB 12.1 06/22/2021 1446   HGB 15.9 05/09/2019 1551   HCT 38.0 06/22/2021 1446   HCT 46.9 (H) 05/09/2019 1551   MCV 97.9 06/22/2021 1446   MCV 96 05/09/2019 1551      No results for input(s):  LIPASE, AMYLASE in the last 168 hours. No results for input(s): AMMONIA in the last 168 hours.   Cardiac Panel (last 3 results) No results for input(s): CKTOTAL, CKMB, TROPONINI, RELINDX in the last 72 hours.   BNP (last 3 results) Recent Labs    07/09/20 0247 07/10/20 0252 06/22/21 1532  BNP 97.5 96.9 54.3     DM  labs:  HbA1C: No results for input(s): HGBA1C in the last 8760 hours.    CBG (last 3)  Recent Labs    06/22/21 1649  GLUCAP 116*        Cultures:    Component Value Date/Time   SDES URINE, RANDOM 05/24/2021 0943   SPECREQUEST NONE 05/24/2021 0943   CULT (A) 05/24/2021 0943    <10,000 COLONIES/mL INSIGNIFICANT GROWTH Performed at [facility], [place] [address], [place], [place] [address]    REPTSTATUS 05/25/2021 FINAL 05/24/2021 2355     Radiological Exams on Admission: DG Ribs Unilateral W/Chest Left  Result Date: 06/22/2021 CLINICAL DATA:  Left rib pain and shortness of breath since fall 2 weeks ago. EXAM: LEFT RIBS AND CHEST - 3+ VIEW COMPARISON:  Chest x-ray May 24, 2021 FINDINGS: No rib fractures identified. No pneumothorax. Mild  opacity in the lateral right lung base was not seen on the May 16, 2021 comparison. No other acute abnormalities. IMPRESSION: 1. No cause for left-sided pain identified. No rib lesions or fractures noted. 2. Mild opacity in the lateral right lung base may represent atelectasis or infiltrate. Recommend clinical correlation and follow-up imaging to ensure resolution. Electronically Signed   By: Dorise Bullion III M.D   On: 06/22/2021 15:59   CT Head Wo Contrast  Result Date: 06/22/2021 CLINICAL DATA:  Altered mental status. EXAM: CT HEAD WITHOUT CONTRAST TECHNIQUE: Contiguous axial images were obtained from the base of the skull through the vertex without intravenous contrast. COMPARISON:  February 11, 2021 FINDINGS: Brain: There is mild cerebral atrophy with widening of the extra-axial spaces and ventricular dilatation. There are areas of decreased attenuation within the white matter tracts of the supratentorial brain, consistent with microvascular disease changes. Vascular: No hyperdense vessel or unexpected calcification. Skull: Normal. Negative for fracture or focal lesion. Sinuses/Orbits: No acute finding. Other: None. IMPRESSION: 1. Generalized cerebral atrophy. 2. No acute intracranial abnormality. Electronically Signed   By: Virgina Norfolk M.D.   On: 06/22/2021 17:00   CT Chest Wo Contrast  Result Date: 06/22/2021 CLINICAL DATA:  Chest trauma. EXAM: CT CHEST WITHOUT CONTRAST TECHNIQUE: Multidetector CT imaging of the chest was performed following the standard protocol without IV contrast. COMPARISON:  May 05, 2021 and August 09, 2020 FINDINGS: Cardiovascular: There is marked severity calcification of the aortic arch without evidence of aneurysmal dilatation. Normal heart size with marked severity coronary artery calcification. No pericardial effusion. Mediastinum/Nodes: No enlarged mediastinal or axillary lymph nodes. Thyroid gland, trachea, and esophagus demonstrate no significant findings.  Lungs/Pleura: A stable 4 mm noncalcified lung nodule is seen within the posterior aspect of the right apex. Mild areas of scarring and/or atelectasis are seen within the bilateral lung bases. There is no evidence of acute infiltrate, pleural effusion or pneumothorax. Upper Abdomen: There is a small hiatal hernia. Musculoskeletal: Chronic fracture deformities are seen involving the posterior eleventh right rib, sternum and T4 vertebral body. A compression fracture deformity of the T8 vertebral body is seen which represents a new finding when compared to the prior exam. Degenerative changes are seen throughout the thoracic spine IMPRESSION: 1. Multiple  chronic fracture deformities, as described above, with a compression fracture deformity of the T8 vertebral body which is new since the prior study. 2. Stable, likely benign right apical lung nodule. 3. Small hiatal hernia. Electronically Signed   By: Virgina Norfolk M.D.   On: 06/22/2021 17:13   _______________________________________________________________________________________________________ Latest  Blood pressure 132/90, pulse 94, resp. rate (!) 23, weight 50.8 kg, SpO2 91 %.   Review of Systems:    Pertinent positives include:  fatigue  Constitutional:  No weight loss, night sweats, Fevers, chills, , weight loss  HEENT:  No headaches, Difficulty swallowing,Tooth/dental problems,Sore throat,  No sneezing, itching, ear ache, nasal congestion, post nasal drip,  Cardio-vascular:  No chest pain, Orthopnea, PND, anasarca, dizziness, palpitations.no Bilateral lower extremity swelling  GI:  No heartburn, indigestion, abdominal pain, nausea, vomiting, diarrhea, change in bowel habits, loss of appetite, melena, blood in stool, hematemesis Resp:  no shortness of breath at rest. No dyspnea on exertion, No excess mucus, no productive cough, No non-productive cough, No coughing up of blood.No change in color of mucus.No wheezing. Skin:  no rash or  lesions. No jaundice GU:  no dysuria, change in color of urine, no urgency or frequency. No straining to urinate.  No flank pain.  Musculoskeletal:  No joint pain or no joint swelling. No decreased range of motion. No back pain.  Psych:  No change in mood or affect. No depression or anxiety. No memory loss.  Neuro: no localizing neurological complaints, no tingling, no weakness, no double vision, no gait abnormality, no slurred speech, no confusion  All systems reviewed and apart from [place] all are negative _______________________________________________________________________________________________ Past Medical History:   Past Medical History:  Diagnosis Date   Acute cystitis    Acute respiratory failure ([place])    Allergy    as a child grew out of them   Anemia    pernicious anemia   Anxiety    Arthritis    Back pain    Blood transfusion    CAP (community acquired pneumonia)    CHF (congestive heart failure) (HCC)    Chronic pain syndrome    Common bile duct stone    Depression    Diverticulosis of colon    Duodenal ulcer hemorrhagic-after biliary sphincterotomy    Dysthymia    Esophagitis    EtOH dependence ([place])    Gastritis    GERD (gastroesophageal reflux disease)    H/O chest pain Dec. 2013   no work up done   Hx of colonic polyps    Hypertension    Irritable bowel syndrome    Lymphocytic colitis 02/16/2020   Osteopenia    Osteoporosis    Pneumonia 2019   Polypharmacy    Reflux esophagitis    Right sided sciatica 12/22/2017   Tobacco use disorder    Unspecified chronic bronchitis ([place])    Vertigo    Vitamin B12 deficiency    Wears glasses     Past Surgical History:  Procedure Laterality Date   APPENDECTOMY     BACK SURGERY  10-09   Dr. Patrice Paradise   BIOPSY  02/14/2020   Procedure: BIOPSY;  Surgeon: Gatha Mayer, MD;  Location: [facility];  Service: Endoscopy;;   CARPAL TUNNEL RELEASE Right 08/14/2014   Procedure: RIGHT CARPAL TUNNEL RELEASE AND INJECT  LEFT THUMB;  Surgeon: Daryll Brod, MD;  Location: [facility];  Service: Orthopedics;  Laterality: Right;   CHOLECYSTECTOMY N/A 02/22/2020   Procedure: LAPAROSCOPIC CHOLECYSTECTOMY WITH INTRAOPERATIVE  CHOLANGIOGRAM;  Surgeon: Kinsinger, Arta Bruce, MD;  Location: [facility];  Service: General;  Laterality: N/A;   COLONOSCOPY     COLONOSCOPY WITH PROPOFOL N/A 02/14/2020   Procedure: COLONOSCOPY WITH PROPOFOL;  Surgeon: Gatha Mayer, MD;  Location: [facility];  Service: Endoscopy;  Laterality: N/A;   ENDOSCOPIC RETROGRADE CHOLANGIOPANCREATOGRAPHY (ERCP) WITH PROPOFOL N/A 12/25/2019   Procedure: ENDOSCOPIC RETROGRADE CHOLANGIOPANCREATOGRAPHY (ERCP) WITH PROPOFOL;  Surgeon: Gatha Mayer, MD;  Location: [facility];  Service: Gastroenterology;  Laterality: N/A;   ESOPHAGOGASTRODUODENOSCOPY (EGD) WITH PROPOFOL N/A 01/06/2020   Procedure: ESOPHAGOGASTRODUODENOSCOPY (EGD) WITH PROPOFOL;  Surgeon: Irene Shipper, MD;  Location[facility];  Service: Endoscopy;  Laterality: N/A;   HOT HEMOSTASIS N/A 01/06/2020   Procedure: HOT HEMOSTASIS (ARGON PLASMA COAGULATION/BICAP);  Surgeon: Irene Shipper, MD;  Location: [facility];  Service: Endoscopy;  Laterality: N/A;   INTRAMEDULLARY (IM) NAIL INTERTROCHANTERIC Right 10/01/2018   Procedure: INTRAMEDULLARY (IM) NAIL INTERTROCHANTRIC;  Surgeon: Thornton Park, MD;  Location: [facility];  Service: Orthopedics;  Laterality: Right;   LAPAROSCOPY N/A 02/21/2013   Procedure: LAPAROSCOPY OPERATIVE;  Surgeon: Margarette Asal, MD;  Location: [facility];  Service: Gynecology;  Laterality: N/A;  REQUESTING 5MM SCOPE WITH CAMERA   LEFT HEART CATH AND CORONARY ANGIOGRAPHY N/A 05/11/2019   Procedure: LEFT HEART CATH AND CORONARY ANGIOGRAPHY;  Surgeon: Sherren Mocha, MD;  Location: [facility];  Service: Cardiovascular;  Laterality: N/A;   REMOVAL OF STONES  12/25/2019   Procedure: Karlyn Agee;  Surgeon: Gatha Mayer, MD;  Location: [facility];  Service:  Gastroenterology;;   Clide Deutscher  01/06/2020   Procedure: Clide Deutscher;  Surgeon: Irene Shipper, MD;  Location: [facility] ENDOSCOPY;  Service: Endoscopy;;   SPHINCTEROTOMY  12/25/2019   Procedure: Joan Mayans;  Surgeon: Gatha Mayer, MD;  Location: [place];  Service: Gastroenterology;;   TONSILLECTOMY     TUBAL LIGATION     UPPER GASTROINTESTINAL ENDOSCOPY      Social History:  Ambulatory   independently       reports that she has been smoking cigarettes. She has a 30.00 pack-year smoking history. She has never used smokeless tobacco. She reports current alcohol use of about 3.0 standard drinks of alcohol per week. She reports that she does not use drugs.    Family History:   Family History  Problem Relation Age of Onset   Colon cancer Father    Prostate cancer Father    Heart disease Mother        prev MVR, also has DJD   Colon cancer Brother    Skin cancer Sister    Alcohol abuse Daughter    Bipolar disorder Daughter    Drug abuse Daughter    Esophageal cancer Neg Hx    Rectal cancer Neg Hx    Stomach cancer Neg Hx    ______________________________________________________________________________________________ Allergies: Allergies  Allergen Reactions   Atorvastatin Nausea And Vomiting   Levofloxacin Other (See Comments)    Achilles tendon pain   Omnicef [Cefdinir] Diarrhea and Other (See Comments)    Uncontrollable diarrhea   Trazodone And Nefazodone Other (See Comments)    "Felt like I was going to faint"   Sulfa Antibiotics Rash   Sulfonamide Derivatives Rash     Prior to Admission medications   Medication Sig Start Date End Date Taking? Authorizing Provider  albuterol (VENTOLIN HFA) 108 (90 Base) MCG/ACT inhaler Inhale 1-2 puffs into the lungs every 6 (six) hours as needed for wheezing or shortness of breath.  [provider]  budesonide (ENTOCORT EC) 3 MG 24 hr capsule TAKE 3 CAPSULES ONCE DAILY. 05/13/21   Gatha Mayer, MD  buPROPion  (WELLBUTRIN SR) 150 MG 12 hr tablet Take 1 tablet (150 mg total) by mouth daily. 04/23/21 10/20/21  Elvin So, MD  cyclobenzaprine (FLEXERIL) 5 MG tablet Take 1 tablet (5 mg total) by mouth 3 (three) times daily as needed for muscle spasms. 06/04/21   Biagio Borg, MD  denosumab (PROLIA) 60 MG/ML SOSY injection Inject 60 mg into the skin every 6 (six) months.    [provider]  diclofenac Sodium (VOLTAREN) 1 % GEL Apply 2 g topically 4 (four) times daily as needed (for joint pain). 02/21/21   Elodia Florence., MD  DULoxetine (CYMBALTA) 30 MG capsule Take 30 mg by mouth daily.    [provider]  hyoscyamine (ANASPAZ) 0.125 MG TBDP disintergrating tablet Place 1 tablet (0.125 mg total) under the tongue every 6 (six) hours as needed ([name] cramps). 04/09/21   Gatha Mayer, MD  morphine (MS CONTIN) 15 MG 12 hr tablet Take 1 tablet (15 mg total) by mouth 3 (three) times daily. 02/17/21   Bonnielee Haff, MD  nicotine (NICODERM CQ - DOSED IN MG/24 HOURS) 21 mg/24hr patch Place 1 patch (21 mg total) onto the skin daily. 01/05/20   Swayze, Ava, DO  nitroGLYCERIN (NITROSTAT) 0.4 MG SL tablet Place 1 tablet (0.4 mg total) under the tongue every 5 (five) minutes as needed for chest pain. 04/22/20   Josue Hector, MD  ondansetron (ZOFRAN-ODT) 4 MG disintegrating tablet DISSOLVE 1 TABLET ON TONGUE EVERY 8 HOURS AS NEEDED FOR NAUSEA/VOMITING. Patient taking differently: Take 4 mg by mouth every 8 (eight) hours as needed for vomiting or nausea (dissolve orally). 04/09/21   Gatha Mayer, MD  Oxycodone HCl 10 MG TABS Take 1 tablet (10 mg total) by mouth 4 (four) times daily as needed. Patient taking differently: Take 10 mg by mouth every 4 (four) hours as needed (for pain). 02/17/21   Bonnielee Haff, MD  pantoprazole (PROTONIX) 40 MG tablet TAKE (1) TABLET TWICE A DAY BEFORE MEALS. Patient taking differently: Take 40 mg by mouth 2 (two) times daily before a meal. 01/29/21   Gatha Mayer,  MD  polyethylene glycol (MIRALAX) 17 g packet Take 17 g by mouth 2 (two) times daily. 05/22/21   White, Leitha Schuller, NP  potassium chloride (KLOR-CON) 10 MEQ tablet Take 1 tablet (10 mEq total) by mouth daily. 05/17/21 06/16/21  Wyvonnia Dusky, MD  rosuvastatin (CRESTOR) 5 MG tablet Take 1 tablet (5 mg total) by mouth 3 (three) times a week. Patient taking differently: Take 5 mg by mouth every Monday, Wednesday, and Friday at 6 PM. 08/28/20   Josue Hector, MD  sennosides-docusate sodium (SENOKOT-S) 8.6-50 MG tablet Take 2 tablets by mouth 2 (two) times daily.    [provider]  sertraline (ZOLOFT) 100 MG tablet Take 2 tablets (200 mg total) by mouth at bedtime. 04/23/21 10/20/21  Elvin So, MD  spironolactone (ALDACTONE) 25 MG tablet Take 0.5 tablets (12.5 mg total) by mouth daily. Patient taking differently: Take 12.5 mg by mouth daily as needed (swelling). 04/22/20   Josue Hector, MD  sucralfate (CARAFATE) 1 g tablet Take 1 tablet (1 g total) by mouth 4 (four) times daily -  with meals and at bedtime for 14 days. Patient taking differently: Take 1 g by mouth in the morning and at bedtime.  05/05/21 05/19/21  Corena Herter, PA-C  valACYclovir (VALTREX) 500 MG tablet Take 500 mg by mouth 2 (two) times daily as needed (flare ups).    [provider]    ___________________________________________________________________________________________________ Physical Exam: Vitals with BMI 06/22/2021 06/22/2021 06/22/2021  Height - - -  Weight - - -  BMI - - -  Systolic [phone]  Diastolic 90 85 72  Pulse 94 92 89     1. General:  in No Acute distress    Chronically ill  -appearing 2. Psychological:lethargic but Oriented 3. Head/ENT:    Dry Mucous Membranes                          Head Non traumatic, neck supple                          Poor Dentition Chest wall tenderness on the left 4. SKIN:  decreased Skin turgor,  Skin clean Dry and intact no rash 5. Heart:  Regular rate and rhythm no  Murmur, no Rub or gallop 6. Lungs:   no wheezes or crackles   7. Abdomen: Soft,  non-tender, Non distended bowel sounds present 8. Lower extremities: no clubbing, cyanosis, no  edema 9. Neurologically Grossly intact, moving all 4 extremities equally   10. MSK: Normal range of motion    Chart has been reviewed  ______________________________________________________________________________________________  Assessment/Plan   70 y.o. female with medical history significant of COPD on home oxygen 2 L as needed, CHF Admitted for  acute encephalopathy acute on chronic respiratory failure  Present on Admission:  Acute on chronic respiratory failure with hypoxia and hypercapnia ([place]) -most likely secondary to opioid overuse.  Chronically on 2 L of oxygen as needed.  Noted to be hypoxic requiring up to 3 L.  VBG showing evidence of hypercarbia.  Treated with oxygen and BiPAP Avoid sedation   T8 vertebral fracture (HCC) -try to avoid narcotics for pain management if possible until patient is more alert.  PT OT assessment.  May need IR consult   Tobacco abuse -  - Spoke about importance of quitting spent 5 minutes discussing options for treatment, prior attempts at quitting, and dangers of smoking  -At this point patient is   interested in quitting  - order nicotine patch   - nursing tobacco cessation protocol   Hyperlipidemia -chronic stable continue home Patient's   Chest wall pain -most likely secondary to fall, imaging showing no fracture or PE There atelectasis on the right. Doubt PNA no WBC fever or cough, hold off ABX for now   Acute metabolic encephalopathy -Patient noted to be profoundly more sedated over time.  Work-up showed evidence of hypercarbia.  Ordered BiPAP, Narcan as needed and repeat VBG. Holding narcotics for now. Was notified by nursing staff that patient actually had narcotics in her purse although she denied taking  Chronic diastolic CHF  -chronic currently appears to be slightly on the dry side continue to monitor avoid fluid overload  Other plan as per orders.  DVT prophylaxis:   Lovenox       Code Status:    Code Status: Prior FULL CODE  as per patient   I had personally discussed CODE STATUS with patient      Family Communication:   Family not at  Bedside    Disposition Plan:    To home once workup is complete and patient is stable  Following barriers for discharge:                            Electrolytes corrected                               Mental status improves                             Pain controlled with PO medications                                                             Will need to be able to tolerate PO                            Will likely need home health, home O2, set up                       Would benefit from PT/OT eval prior to DC  Ordered                                        Consults called: none  Admission status:  ED Disposition     ED Disposition  Admit   Condition  --   [place]: [place] [[id number]]  Level of Care: Telemetry Cardiac [[id number]]  May place patient in observation at [facility] or [place] if equivalent level of care is available:: No  Covid Evaluation: Asymptomatic Screening Protocol (No Symptoms)  Diagnosis: Acute on chronic respiratory failure [facility]) [[id number]]  Admitting Physician: Toy Baker [[id number]]  Attending Physician: Toy Baker [[id number]]           Obs       Level of care    progressive tele indefinitely please discontinue once patient no longer qualifies COVID-19 Labs    Lab Results  Component Value Date   [place] 06/22/2021     Precautions: admitted as  Covid Negative     PPE: Used by the provider:   N95  eye Goggles,  Gloves    Odalys Win 06/23/2021, 12:27 AM    [facility]     after 2 AM please page floor coverage PA If 7AM-7PM, please contact  the day team taking care of the patient using [email or web address]   Patient was evaluated in the context of the global COVID-19 pandemic, which necessitated consideration that the patient might be at risk for infection with the SARS-CoV-2 virus that causes COVID-19. Institutional protocols and algorithms that pertain to the evaluation of patients at risk for COVID-19 are in a state of rapid change based on information released by regulatory bodies including the CDC and federal and state organizations. These policies and algorithms were followed during the patient's care.

## 2021-06-22 NOTE — ED Triage Notes (Signed)
Pt feels may have fractured a rib (due to c/o rib pain on left side, reports started Friday, no known injury) states trouble breathing and low 02 sat at home. Daughter reports difficulty with dexterity worsening from normal, initially trouble falling asleep but now drowsiness/trouble staying awake/confusion. Sat 84% on RA, up to 94% on 3L [place].   Pt reports pre-existing spinal fractures.

## 2021-06-22 NOTE — ED Notes (Signed)
Pt to CT

## 2021-06-22 NOTE — ED Provider Notes (Addendum)
[place]   MRN: [id number] DOB: 10-29-1951  Subjective:   Denise King is a 70 y.o. female presenting for acute onset of difficulty breathing this morning.  Patient has a history of frequent falls, last fall was 2 weeks ago, fell down some steps and ended up colliding against a brick wall on the left side of her torso.  She has since had progressively worsening moderate to severe left-sided chest wall/rib pain.  However, patient presents with her daughter who noticed that she has altered mental status and disorientation today.  She does have a history of chronic bronchitis, history of respiratory failure and congestive heart failure.  She does have the capacity to use 2 L of oxygen at home as needed.  Normally her pulse oximetry is in the low 90s.  Has not needed to use her home oxygen.  No current facility-administered medications for this encounter.  Current Outpatient Medications:    albuterol (VENTOLIN HFA) 108 (90 Base) MCG/ACT inhaler, Inhale 1-2 puffs into the lungs every 6 (six) hours as needed for wheezing or shortness of breath., Disp: , Rfl:    budesonide (ENTOCORT EC) 3 MG 24 hr capsule, TAKE 3 CAPSULES ONCE DAILY., Disp: 90 capsule, Rfl: 1   buPROPion (WELLBUTRIN SR) 150 MG 12 hr tablet, Take 1 tablet (150 mg total) by mouth daily., Disp: 90 tablet, Rfl: 1   cyclobenzaprine (FLEXERIL) 5 MG tablet, Take 1 tablet (5 mg total) by mouth 3 (three) times daily as needed for muscle spasms., Disp: 60 tablet, Rfl: 2   denosumab (PROLIA) 60 MG/ML SOSY injection, Inject 60 mg into the skin every 6 (six) months., Disp: , Rfl:    diclofenac Sodium (VOLTAREN) 1 % GEL, Apply 2 g topically 4 (four) times daily as needed (for joint pain)., Disp: , Rfl:    DULoxetine (CYMBALTA) 30 MG capsule, Take 30 mg by mouth daily., Disp: , Rfl:    hyoscyamine (ANASPAZ) 0.125 MG TBDP disintergrating tablet, Place 1 tablet (0.125 mg total) under the tongue every 6 (six) hours as needed  ([name] cramps)., Disp: 90 tablet, Rfl: 3   morphine (MS CONTIN) 15 MG 12 hr tablet, Take 1 tablet (15 mg total) by mouth 3 (three) times daily., Disp: 30 tablet, Rfl: 0   nicotine (NICODERM CQ - DOSED IN MG/24 HOURS) 21 mg/24hr patch, Place 1 patch (21 mg total) onto the skin daily., Disp: 28 patch, Rfl: 0   nitroGLYCERIN (NITROSTAT) 0.4 MG SL tablet, Place 1 tablet (0.4 mg total) under the tongue every 5 (five) minutes as needed for chest pain., Disp: 25 tablet, Rfl: 3   ondansetron (ZOFRAN-ODT) 4 MG disintegrating tablet, DISSOLVE 1 TABLET ON TONGUE EVERY 8 HOURS AS NEEDED FOR NAUSEA/VOMITING. (Patient taking differently: Take 4 mg by mouth every 8 (eight) hours as needed for vomiting or nausea (dissolve orally).), Disp: 30 tablet, Rfl: 3   Oxycodone HCl 10 MG TABS, Take 1 tablet (10 mg total) by mouth 4 (four) times daily as needed. (Patient taking differently: Take 10 mg by mouth every 4 (four) hours as needed (for pain).), Disp: 15 tablet, Rfl: 0   pantoprazole (PROTONIX) 40 MG tablet, TAKE (1) TABLET TWICE A DAY BEFORE MEALS. (Patient taking differently: Take 40 mg by mouth 2 (two) times daily before a meal.), Disp: 60 tablet, Rfl: 11   polyethylene glycol (MIRALAX) 17 g packet, Take 17 g by mouth 2 (two) times daily., Disp: 14 each, Rfl: 0   potassium chloride (KLOR-CON) 10  MEQ tablet, Take 1 tablet (10 mEq total) by mouth daily., Disp: 30 tablet, Rfl: 0   rosuvastatin (CRESTOR) 5 MG tablet, Take 1 tablet (5 mg total) by mouth 3 (three) times a week. (Patient taking differently: Take 5 mg by mouth every Monday, Wednesday, and Friday at 6 PM.), Disp: 39 tablet, Rfl: 3   sennosides-docusate sodium (SENOKOT-S) 8.6-50 MG tablet, Take 2 tablets by mouth 2 (two) times daily., Disp: , Rfl:    sertraline (ZOLOFT) 100 MG tablet, Take 2 tablets (200 mg total) by mouth at bedtime., Disp: 180 tablet, Rfl: 1   spironolactone (ALDACTONE) 25 MG tablet, Take 0.5 tablets (12.5 mg total) by mouth daily. (Patient  taking differently: Take 12.5 mg by mouth daily as needed (swelling).), Disp: 45 tablet, Rfl: 3   sucralfate (CARAFATE) 1 g tablet, Take 1 tablet (1 g total) by mouth 4 (four) times daily -  with meals and at bedtime for 14 days. (Patient taking differently: Take 1 g by mouth in the morning and at bedtime.), Disp: 56 tablet, Rfl: 0   valACYclovir (VALTREX) 500 MG tablet, Take 500 mg by mouth 2 (two) times daily as needed (flare ups)., Disp: , Rfl:    Allergies  Allergen Reactions   Atorvastatin Nausea And Vomiting   Levofloxacin Other (See Comments)    Achilles tendon pain   Omnicef [Cefdinir] Diarrhea and Other (See Comments)    Uncontrollable diarrhea   Trazodone And Nefazodone Other (See Comments)    "Felt like I was going to faint"   Sulfa Antibiotics Rash   Sulfonamide Derivatives Rash    Past Medical History:  Diagnosis Date   Acute cystitis    Acute respiratory failure ([place])    Allergy    as a child grew out of them   Anemia    pernicious anemia   Anxiety    Arthritis    Back pain    Blood transfusion    CAP (community acquired pneumonia)    CHF (congestive heart failure) (HCC)    Chronic pain syndrome    Common bile duct stone    Depression    Diverticulosis of colon    Duodenal ulcer hemorrhagic-after biliary sphincterotomy    Dysthymia    Esophagitis    EtOH dependence ([place])    Gastritis    GERD (gastroesophageal reflux disease)    H/O chest pain Dec. 2013   no work up done   Hx of colonic polyps    Hypertension    Irritable bowel syndrome    Lymphocytic colitis 02/16/2020   Osteopenia    Osteoporosis    Pneumonia 2019   Polypharmacy    Reflux esophagitis    Right sided sciatica 12/22/2017   Tobacco use disorder    Unspecified chronic bronchitis (HCC)    Vertigo    Vitamin B12 deficiency    Wears glasses      Past Surgical History:  Procedure Laterality Date   APPENDECTOMY     BACK SURGERY  10-09   Dr. Patrice Paradise   BIOPSY  02/14/2020   Procedure:  BIOPSY;  Surgeon: Gatha Mayer, MD;  Location: [facility] END[facility];  Service: Endoscopy;;   CARPAL TUNNEL RELEASE Right 08/14/2014   Procedure: RIGHT CARPAL TUNNEL RELEASE AND INJECT LEFT THUMB;  Surgeon: Daryll Brod, MD;  Location: [facility];  Service: Orthopedics;  Laterality: Right;   CHOLECYSTECTOMY N/A 02/22/2020   Procedure: LAPAROSCOPIC CHOLECYSTECTOMY WITH INTRAOPERATIVE CHOLANGIOGRAM;  Surgeon: Kieth Brightly Arta Bruce, MD;  Location: [facility] ORS;  Service: General;  Laterality: N/A;   COLONOSCOPY     COLONOSCOPY WITH PROPOFOL N/A 02/14/2020   Procedure: COLONOSCOPY WITH PROPOFOL;  Surgeon: Gatha Mayer, MD;  Location: [facility];  Service: Endoscopy;  Laterality: N/A;   ENDOSCOPIC RETROGRADE CHOLANGIOPANCREATOGRAPHY (ERCP) WITH PROPOFOL N/A 12/25/2019   Procedure: ENDOSCOPIC RETROGRADE CHOLANGIOPANCREATOGRAPHY (ERCP) WITH PROPOFOL;  Surgeon: Gatha Mayer, MD;  Location: [facility];  Service: Gastroenterology;  Laterality: N/A;   ESOPHAGOGASTRODUODENOSCOPY (EGD) WITH PROPOFOL N/A 01/06/2020   Procedure: ESOPHAGOGASTRODUODENOSCOPY (EGD) WITH PROPOFOL;  Surgeon: Irene Shipper, MD;  Location[facility] ENDOSCOPY;  Service: Endoscopy;  Laterality: N/A;   HOT HEMOSTASIS N/A 01/06/2020   Procedure: HOT HEMOSTASIS (ARGON PLASMA COAGULATION/BICAP);  Surgeon: Irene Shipper, MD;  Location[facility] ENDOSCOPY;  Service: Endoscopy;  Laterality: N/A;   INTRAMEDULLARY (IM) NAIL INTERTROCHANTERIC Right 10/01/2018   Procedure: INTRAMEDULLARY (IM) NAIL INTERTROCHANTRIC;  Surgeon: Thornton Park, MD;  Location: [facility] ORS;  Service: Orthopedics;  Laterality: Right;   LAPAROSCOPY N/A 02/21/2013   Procedure: LAPAROSCOPY OPERATIVE;  Surgeon: Margarette Asal, MD;  Location: [facility]S;  Service: Gynecology;  Laterality: N/A;  REQUESTING 5MM SCOPE WITH CAMERA   LEFT HEART CATH AND CORONARY ANGIOGRAPHY N/A 05/11/2019   Procedure: LEFT HEART CATH AND CORONARY ANGIOGRAPHY;  Surgeon: Sherren Mocha, MD;  Location: [place]  CV LAB;  Service: Cardiovascular;  Laterality: N/A;   REMOVAL OF STONES  12/25/2019   Procedure: Karlyn Agee;  Surgeon: Gatha Mayer, MD;  Location: [facility] ENDOSCOPY;  Service: Gastroenterology;;   Clide Deutscher  01/06/2020   Procedure: Clide Deutscher;  Surgeon: Irene Shipper, MD;  Location: [facility] ENDOSCOPY;  Service: Endoscopy;;   SPHINCTEROTOMY  12/25/2019   Procedure: Joan Mayans;  Surgeon: Gatha Mayer, MD;  Location[facility] ENDOSCOPY;  Service: Gastroenterology;;   TONSILLECTOMY     TUBAL LIGATION     UPPER GASTROINTESTINAL ENDOSCOPY      Family History  Problem Relation Age of Onset   Colon cancer Father    Prostate cancer Father    Heart disease Mother        prev MVR, also has DJD   Colon cancer Brother    Skin cancer Sister    Alcohol abuse Daughter    Bipolar disorder Daughter    Drug abuse Daughter    Esophageal cancer Neg Hx    Rectal cancer Neg Hx    Stomach cancer Neg Hx     Social History   Tobacco Use   Smoking status: Every Day    Packs/day: 1.00    Years: 30.00    Pack years: 30.00    Types: Cigarettes   Smokeless tobacco: Never  Vaping Use   Vaping Use: Never used  Substance Use Topics   Alcohol use: Yes    Alcohol/week: 3.0 standard drinks    Types: 3 Standard drinks or equivalent per week    Comment: occasional   Drug use: No    ROS   Objective:   Vitals: BP 120/77   Pulse (!) 106   Temp 99.2 F (37.3 C)   SpO2 (!) 84% Comment: 95 on 3L [place]  Physical Exam Constitutional:      General: She is not in acute distress.    Appearance: Normal appearance. She is well-developed. She is not ill-appearing, toxic-appearing or diaphoretic.  HENT:     Head: Normocephalic and atraumatic.     Nose: Nose normal.     Mouth/Throat:     Mouth: Mucous membranes are moist.  Eyes:  Extraocular Movements: Extraocular movements intact.     Pupils: Pupils are equal, round, and reactive to light.  Cardiovascular:     Rate and Rhythm: Regular rhythm.  Tachycardia present.     Pulses: Normal pulses.     Heart sounds: Normal heart sounds. No murmur heard.   No friction rub. No gallop.  Pulmonary:     Effort: Pulmonary effort is normal. No respiratory distress.     Breath sounds: No stridor. No wheezing, rhonchi or rales.     Comments: Slightly decreased breath sounds mid to lower lung fields. Chest:     Chest wall: Tenderness (left chest wall) present.  Skin:    General: Skin is warm and dry.     Findings: No rash.  Neurological:     Mental Status: She is alert and oriented to person, place, and time.    ED ECG REPORT   Date: 06/22/2021  Rate: 106bpm  Rhythm: sinus tachycardia  QRS Axis: right  Intervals: normal  ST/T Wave abnormalities: nonspecific T wave changes  Conduction Disutrbances:none  Narrative Interpretation: Sinus tachycardia at 106 bpm with nonspecific T wave flattening.  Very comparable to previous EKG.  Old EKG Reviewed: unchanged  I have personally reviewed the EKG tracing and agree with the computerized printout as noted.   Assessment and Plan :   PDMP not reviewed this encounter.  1. Altered mental status, unspecified altered mental status type   2. Hypoxemia   3. Chronic bronchitis, unspecified chronic bronchitis type ([place])   4. Congestive heart failure, unspecified HF chronicity, unspecified heart failure type [facility])     Patient is in need of a higher level of care than we can provide in the urgent care setting.  At triage patient was found to have 84% pulse oximetry on room air and was having difficulty breathing.  This improved to 97% on 3 L of oxygen.  EKG does not show any signs of ACS or acute changes.  Concern is for acute on chronic respiratory failure, CHF, severe rib fracture.  An IV was established prior to EMS arrival, case reported out, transfer of care completed for transport to the [place], [place] 06/22/21 [place]    Jaynee Eagles, [place] 06/22/21 1441

## 2021-06-22 NOTE — ED Notes (Signed)
Attempted to call report to ED charge x2 with no answer.

## 2021-06-22 NOTE — ED Notes (Signed)
EMS called for transport.

## 2021-06-22 NOTE — ED Provider Notes (Signed)
[facility] EMERGENCY DEPARTMENT Provider Note   CSN: [id number] Arrival date & time: 06/22/21  1426     History Chief Complaint  Patient presents with   Shortness of Breath    Denise King is a 70 y.o. female with a history of CHF, hypertension, alcohol use, GERD.  Patient presents emerged department with a chief complaint of left-sided chest wall/rib pain, shortness of breath, and increased somnolence.  Patient reports that she suffered a fall 2 weeks prior.  Patient states that she fell down 4 steps colliding with a brick wall on her left side.  Patient denies any loss of consciousness or hitting her head during that encounter.  Since then patient has had pain to the left side of her chest wall.  Pain has progressively worsened since then.  At present patient rates her pain 8/10 on the pain scale.  Patient reports that she has had minimal relief with her prescribed morphine and oxycodone.  Patient denies any aggravating factors.  Patient reports that "feels like someone punched me in the ribs."  Patient has also developed worsening shortness of breath since this event.  Patient reports that she uses 2 L of oxygen via nasal cannula at night.  Patient does not require any oxygen during the day.  Her chart review patient had an oxygen saturation at 84% on room air at urgent care earlier today.  Oxygen saturation 93% on 3 L via nasal cannula.  Mr. Watt Climes reports that when she went over to see the patient this morning at 1130 patient was having difficulty forming coherent sentences and was falling asleep.  States that patient has had previous episodes like this when she has had exacerbation of her heart failure.  Daughter last spoke to the patient 1800 yesterday and she was at her normal baseline.  Patient does live with her husband and another daughter.  When asked about her somnolence patient reports that she did not sleep last night due to her chest wall  pain.  Endorses palpitations, nausea, and vomiting.  Patient had episode of vomiting 2 nights prior.  Emesis was described as bilious.  No hematemesis or coffee-ground emesis.  Patient denies any fevers, chills, cough, hemoptysis, leg swelling, abdominal pain, blood in stool, melena, neck pain, back pain, saddle anesthesia, bowel or bladder dysfunction, numbness to extremities, weakness to extremities, dysuria, hematuria, urinary frequency, loss of consciousness, headache, lightheadedness, dizziness, visual disturbance.   Shortness of Breath Associated symptoms: chest pain (left chestwall/rib) and vomiting   Associated symptoms: no abdominal pain, no cough, no fever, no headaches, no neck pain, no rash and no sore throat       Past Medical History:  Diagnosis Date   Acute cystitis    Acute respiratory failure ([place])    Allergy    as a child grew out of them   Anemia    pernicious anemia   Anxiety    Arthritis    Back pain    Blood transfusion    CAP (community acquired pneumonia)    CHF (congestive heart failure) (HCC)    Chronic pain syndrome    Common bile duct stone    Depression    Diverticulosis of colon    Duodenal ulcer hemorrhagic-after biliary sphincterotomy    Dysthymia    Esophagitis    EtOH dependence ([place])    Gastritis    GERD (gastroesophageal reflux disease)    H/O chest pain Dec. 2013   no work up done  Hx of colonic polyps    Hypertension    Irritable bowel syndrome    Lymphocytic colitis 02/16/2020   Osteopenia    Osteoporosis    Pneumonia 2019   Polypharmacy    Reflux esophagitis    Right sided sciatica 12/22/2017   Tobacco use disorder    Unspecified chronic bronchitis (HCC)    Vertigo    Vitamin B12 deficiency    Wears glasses     Patient Active Problem List   Diagnosis Date Noted   Compression fracture of thoracic vertebra ([place]) 05/16/2021   Aortic atherosclerosis ([place]) 03/13/2021   Genital herpes simplex 03/12/2021   Hyperlipidemia  03/12/2021   Severe sepsis (HCC) 02/10/2021   Acute cystitis 02/10/2021   Left hip pain 62/22/9798   Acute metabolic encephalopathy 92/10/9416   Toe fracture, right 02/01/2021   Concussion 02/01/2021   Floaters, bilateral 01/31/2021   Acute on chronic respiratory failure with hypoxia and hypercapnia ([place]) 07/10/2020   Acute on chronic heart failure ([place]) 07/04/2020   Choledocholithiasis 02/22/2020   Lymphocytic colitis 02/16/2020   Duodenal ulcer hemorrhagic    Malnutrition of moderate degree 01/01/2020   Acute respiratory failure with hypoxia and hypercapnia (HCC) 12/28/2019   Alcohol abuse 12/28/2019   Anxiety 12/28/2019   Acute on chronic heart failure with preserved ejection fraction ([place]) 12/28/2019   Abnormal MRI of abdomen    Cholelithiasis    Common bile duct stone 12/23/2019   Acute cholecystitis 12/23/2019   Polycythemia 09/11/2019   Right knee pain 08/09/2019   Quadriceps contusion 08/09/2019   Abnormal cardiac CT angiography 05/11/2019   Chest pain 04/11/2019   Acute viral syndrome 04/11/2019   Tobacco abuse 04/11/2019   Hyperphosphatemia 04/11/2019   SOB (shortness of breath) 04/07/2019   Osteoporosis 01/03/2019   Closed fracture of neck of femur ([place]) 10/17/2018   Hip fracture ([place]) 10/01/2018   Dysuria 05/20/2018   Right leg swelling 05/20/2018   CAP (community acquired pneumonia) 05/13/2018   Tachycardia 05/13/2018   Chest wall pain 05/13/2018   Narcotic poisoning ([place]) 05/13/2018   EtOH dependence ([place])    Polypharmacy    Esophagitis    Preventative health care 12/22/2017   Erythrocytosis 12/22/2017   Depression 12/22/2017   Hyperglycemia 12/22/2017   Right sided sciatica 12/22/2017   Cough 06/10/2017   Family history of colon cancer - brother and father 03/10/2016   Insomnia 02/11/2016   Generalized anxiety disorder 11/25/2015   Urinary frequency 04/25/2015   Nausea and vomiting in adult 11/08/2014   Spinal stenosis of lumbar region without  neurogenic claudication 02/14/2014   Cigarette smoker 08/15/2013   Otitis media 10/27/2012   Abdominal pain 03/30/2012   HEPATIC CYST 02/18/2010   Chronic pain 02/08/2010   Vitamin B12 deficiency 10/04/2009   GERD 01/04/2009   ABDOMINAL PAIN-EPIGASTRIC 01/04/2009   Depressive disorder 08/10/2008   Venous (peripheral) insufficiency 08/10/2008   Irritable bowel syndrome 08/10/2008   Back pain 08/10/2008   CIGARETTE SMOKER 03/06/2008   Hx of adenomatous polyp of colon 12/06/2007   BRONCHITIS, RECURRENT 12/06/2007   DIVERTICULOSIS OF COLON 12/06/2007   Other forms of scoliosis, lumbosacral region 12/06/2007   CALCULUS, KIDNEY 10/07/2007   VERTIGO 10/07/2007   Headache(784.0) 10/07/2007    Past Surgical History:  Procedure Laterality Date   APPENDECTOMY     BACK SURGERY  10-09   Dr. Patrice Paradise   BIOPSY  02/14/2020   Procedure: BIOPSY;  Surgeon: Gatha Mayer, MD;  Location: [facility];  Service: Endoscopy;;  CARPAL TUNNEL RELEASE Right 08/14/2014   Procedure: RIGHT CARPAL TUNNEL RELEASE AND INJECT LEFT THUMB;  Surgeon: Daryll Brod, MD;  Location: [facility];  Service: Orthopedics;  Laterality: Right;   CHOLECYSTECTOMY N/A 02/22/2020   Procedure: LAPAROSCOPIC CHOLECYSTECTOMY WITH INTRAOPERATIVE CHOLANGIOGRAM;  Surgeon: Kieth Brightly Arta Bruce, MD;  Location: [facility];  Service: General;  Laterality: N/A;   COLONOSCOPY     COLONOSCOPY WITH PROPOFOL N/A 02/14/2020   Procedure: COLONOSCOPY WITH PROPOFOL;  Surgeon: Gatha Mayer, MD;  Location: [facility];  Service: Endoscopy;  Laterality: N/A;   ENDOSCOPIC RETROGRADE CHOLANGIOPANCREATOGRAPHY (ERCP) WITH PROPOFOL N/A 12/25/2019   Procedure: ENDOSCOPIC RETROGRADE CHOLANGIOPANCREATOGRAPHY (ERCP) WITH PROPOFOL;  Surgeon: Gatha Mayer, MD;  Location: [facility];  Service: Gastroenterology;  Laterality: N/A;   ESOPHAGOGASTRODUODENOSCOPY (EGD) WITH PROPOFOL N/A 01/06/2020   Procedure: ESOPHAGOGASTRODUODENOSCOPY (EGD) WITH  PROPOFOL;  Surgeon: Irene Shipper, MD;  Location: [facility] ENDOSCOPY;  Service: Endoscopy;  Laterality: N/A;   HOT HEMOSTASIS N/A 01/06/2020   Procedure: HOT HEMOSTASIS (ARGON PLASMA COAGULATION/BICAP);  Surgeon: Irene Shipper, MD;  Location: [facility] ENDOSCOPY;  Service: Endoscopy;  Laterality: N/A;   INTRAMEDULLARY (IM) NAIL INTERTROCHANTERIC Right 10/01/2018   Procedure: INTRAMEDULLARY (IM) NAIL INTERTROCHANTRIC;  Surgeon: Thornton Park, MD;  Location: [facility] ORS;  Service: Orthopedics;  Laterality: Right;   LAPAROSCOPY N/A 02/21/2013   Procedure: LAPAROSCOPY OPERATIVE;  Surgeon: Margarette Asal, MD;  Location: [facility]S;  Service: Gynecology;  Laterality: N/A;  REQUESTING 5MM SCOPE WITH CAMERA   LEFT HEART CATH AND CORONARY ANGIOGRAPHY N/A 05/11/2019   Procedure: LEFT HEART CATH AND CORONARY ANGIOGRAPHY;  Surgeon: Sherren Mocha, MD;  Location: [facility] LAB;  Service: Cardiovascular;  Laterality: N/A;   REMOVAL OF STONES  12/25/2019   Procedure: Karlyn Agee;  Surgeon: Gatha Mayer, MD;  Location: [facility] ENDOSCOPY;  Service: Gastroenterology;;   Clide Deutscher  01/06/2020   Procedure: Clide Deutscher;  Surgeon: Irene Shipper, MD;  Location: [facility] ENDOSCOPY;  Service: Endoscopy;;   SPHINCTEROTOMY  12/25/2019   Procedure: Joan Mayans;  Surgeon: Gatha Mayer, MD;  Location: [facility] ENDOSCOPY;  Service: Gastroenterology;;   TONSILLECTOMY     TUBAL LIGATION     UPPER GASTROINTESTINAL ENDOSCOPY       OB History   No obstetric history on file.     Family History  Problem Relation Age of Onset   Colon cancer Father    Prostate cancer Father    Heart disease Mother        prev MVR, also has DJD   Colon cancer Brother    Skin cancer Sister    Alcohol abuse Daughter    Bipolar disorder Daughter    Drug abuse Daughter    Esophageal cancer Neg Hx    Rectal cancer Neg Hx    Stomach cancer Neg Hx     Social History   Tobacco Use   Smoking status: Every Day    Packs/day: 1.00    Years: 30.00     Pack years: 30.00    Types: Cigarettes   Smokeless tobacco: Never  Vaping Use   Vaping Use: Never used  Substance Use Topics   Alcohol use: Yes    Alcohol/week: 3.0 standard drinks    Types: 3 Standard drinks or equivalent per week    Comment: occasional   Drug use: No    Home Medications Prior to Admission medications   Medication Sig Start Date End Date Taking? Authorizing Provider  albuterol (VENTOLIN HFA) 108 (90 Base) MCG/ACT inhaler  Inhale 1-2 puffs into the lungs every 6 (six) hours as needed for wheezing or shortness of breath.    [provider]  budesonide (ENTOCORT EC) 3 MG 24 hr capsule TAKE 3 CAPSULES ONCE DAILY. 05/13/21   Gatha Mayer, MD  buPROPion (WELLBUTRIN SR) 150 MG 12 hr tablet Take 1 tablet (150 mg total) by mouth daily. 04/23/21 10/20/21  Elvin So, MD  cyclobenzaprine (FLEXERIL) 5 MG tablet Take 1 tablet (5 mg total) by mouth 3 (three) times daily as needed for muscle spasms. 06/04/21   Biagio Borg, MD  denosumab (PROLIA) 60 MG/ML SOSY injection Inject 60 mg into the skin every 6 (six) months.    [provider]  diclofenac Sodium (VOLTAREN) 1 % GEL Apply 2 g topically 4 (four) times daily as needed (for joint pain). 02/21/21   Elodia Florence., MD  DULoxetine (CYMBALTA) 30 MG capsule Take 30 mg by mouth daily.    [provider]  hyoscyamine (ANASPAZ) 0.125 MG TBDP disintergrating tablet Place 1 tablet (0.125 mg total) under the tongue every 6 (six) hours as needed ([name] cramps). 04/09/21   Gatha Mayer, MD  morphine (MS CONTIN) 15 MG 12 hr tablet Take 1 tablet (15 mg total) by mouth 3 (three) times daily. 02/17/21   Bonnielee Haff, MD  nicotine (NICODERM CQ - DOSED IN MG/24 HOURS) 21 mg/24hr patch Place 1 patch (21 mg total) onto the skin daily. 01/05/20   Swayze, Ava, DO  nitroGLYCERIN (NITROSTAT) 0.4 MG SL tablet Place 1 tablet (0.4 mg total) under the tongue every 5 (five) minutes as needed for chest pain. 04/22/20   Josue Hector, MD  ondansetron (ZOFRAN-ODT) 4 MG disintegrating tablet DISSOLVE 1 TABLET ON TONGUE EVERY 8 HOURS AS NEEDED FOR NAUSEA/VOMITING. Patient taking differently: Take 4 mg by mouth every 8 (eight) hours as needed for vomiting or nausea (dissolve orally). 04/09/21   Gatha Mayer, MD  Oxycodone HCl 10 MG TABS Take 1 tablet (10 mg total) by mouth 4 (four) times daily as needed. Patient taking differently: Take 10 mg by mouth every 4 (four) hours as needed (for pain). 02/17/21   Bonnielee Haff, MD  pantoprazole (PROTONIX) 40 MG tablet TAKE (1) TABLET TWICE A DAY BEFORE MEALS. Patient taking differently: Take 40 mg by mouth 2 (two) times daily before a meal. 01/29/21   Gatha Mayer, MD  polyethylene glycol (MIRALAX) 17 g packet Take 17 g by mouth 2 (two) times daily. 05/22/21   White, Leitha Schuller, NP  potassium chloride (KLOR-CON) 10 MEQ tablet Take 1 tablet (10 mEq total) by mouth daily. 05/17/21 06/16/21  Wyvonnia Dusky, MD  rosuvastatin (CRESTOR) 5 MG tablet Take 1 tablet (5 mg total) by mouth 3 (three) times a week. Patient taking differently: Take 5 mg by mouth every Monday, Wednesday, and Friday at 6 PM. 08/28/20   Josue Hector, MD  sennosides-docusate sodium (SENOKOT-S) 8.6-50 MG tablet Take 2 tablets by mouth 2 (two) times daily.    [provider]  sertraline (ZOLOFT) 100 MG tablet Take 2 tablets (200 mg total) by mouth at bedtime. 04/23/21 10/20/21  Elvin So, MD  spironolactone (ALDACTONE) 25 MG tablet Take 0.5 tablets (12.5 mg total) by mouth daily. Patient taking differently: Take 12.5 mg by mouth daily as needed (swelling). 04/22/20   Josue Hector, MD  sucralfate (CARAFATE) 1 g tablet Take 1 tablet (1 g total) by mouth 4 (four) times daily -  with meals  and at bedtime for 14 days. Patient taking differently: Take 1 g by mouth in the morning and at bedtime. 05/05/21 05/19/21  Corena Herter, PA-C  valACYclovir (VALTREX) 500 MG tablet Take 500 mg by mouth 2 (two) times  daily as needed (flare ups).    [provider]    Allergies    Atorvastatin, Levofloxacin, Omnicef [cefdinir], Trazodone and nefazodone, Sulfa antibiotics, and Sulfonamide derivatives  Review of Systems   Review of Systems  Constitutional:  Negative for chills and fever.  HENT:  Negative for congestion, rhinorrhea and sore throat.   Eyes:  Negative for visual disturbance.  Respiratory:  Positive for shortness of breath. Negative for cough.   Cardiovascular:  Positive for chest pain (left chestwall/rib) and palpitations. Negative for leg swelling.  Gastrointestinal:  Positive for nausea and vomiting. Negative for abdominal pain.  Genitourinary:  Negative for difficulty urinating, dysuria, frequency and hematuria.  Musculoskeletal:  Negative for back pain and neck pain.  Skin:  Negative for color change, pallor, rash and wound.  Neurological:  Negative for dizziness, tremors, seizures, syncope, facial asymmetry, speech difficulty, weakness, light-headedness, numbness and headaches.  Psychiatric/Behavioral:  Positive for sleep disturbance. Negative for confusion.    Physical Exam Updated Vital Signs BP 140/82   Pulse 96   Resp (!) 23   Wt 50.8 kg   SpO2 100%   BMI 19.84 kg/m   Physical Exam Vitals and nursing note reviewed.  Constitutional:      General: She is sleeping. She is not in acute distress.    Appearance: She is not ill-appearing, toxic-appearing or diaphoretic.     Interventions: Nasal cannula in place.     Comments: On 3 LPM of O2 via nasal cannula  Eyes:     General: No scleral icterus.       Right eye: No discharge.        Left eye: No discharge.     Extraocular Movements: Extraocular movements intact.     Conjunctiva/sclera: Conjunctivae normal.     Pupils: Pupils are equal, round, and reactive to light.     Comments: Pupils 4 mm bilaterally  Cardiovascular:     Rate and Rhythm: Normal rate.  Pulmonary:     Effort: Pulmonary effort is normal. No  tachypnea, bradypnea or respiratory distress.     Breath sounds: No stridor. Examination of the left-upper field reveals decreased breath sounds. Examination of the left-middle field reveals decreased breath sounds. Examination of the left-lower field reveals decreased breath sounds. Decreased breath sounds present. No wheezing, rhonchi or rales.     Comments: Patient able speak in full pretenses without difficulty Chest:     Chest wall: No mass, lacerations, deformity, swelling, tenderness, crepitus or edema.     Comments: No flail chest, contusion, ecchymosis, or tenderness to left chest wall Abdominal:     General: Abdomen is flat. There is no distension. There are no signs of injury.     Palpations: Abdomen is soft. There is no mass or pulsatile mass.     Tenderness: There is no abdominal tenderness. There is no guarding or rebound.     Hernia: There is no hernia in the umbilical area or ventral area.  Musculoskeletal:     Cervical back: Normal range of motion and neck supple. No rigidity.     Right lower leg: Normal. No swelling or tenderness. No edema.     Left lower leg: Normal. No swelling or tenderness. No edema.  Skin:  General: Skin is warm and dry.     Coloration: Skin is not pale.  Neurological:     General: No focal deficit present.     Mental Status: She is oriented to person, place, and time and easily aroused.     GCS: GCS eye subscore is 3. GCS verbal subscore is 5. GCS motor subscore is 6.     Cranial Nerves: No cranial nerve deficit or facial asymmetry.     Sensory: Sensation is intact.     Motor: No weakness, tremor, seizure activity or pronator drift.     Coordination: Finger-Nose-Finger Test normal.     Comments: CN II-XII intact; performed in supine position, +5 strength to bilateral upper extremities, +5 strength to dorsiflexion and plantarflexion, patient able to left both legs against gravity and hold each there without difficulty, sensation to light touch  intact to bilateral upper and lower extremities.  She is sleeping however easily arousable to voice.  Patient has increased somnolence and falls asleep multiple times during interview.  When awake patient is alert to person, place, and time.  Psychiatric:        Behavior: Behavior is cooperative.    ED Results / Procedures / Treatments   Labs (all labs ordered are listed, but only abnormal results are displayed) Labs Reviewed  BASIC METABOLIC PANEL - Abnormal; Notable for the following components:      Result Value   Glucose, Bld 109 (*)    BUN 7 (*)    Calcium 7.5 (*)    All other components within normal limits  HEPATIC FUNCTION PANEL - Abnormal; Notable for the following components:   Total Protein 6.2 (*)    Albumin 3.4 (*)    All other components within normal limits  CBG MONITORING, ED - Abnormal; Notable for the following components:   Glucose-Capillary 116 (*)    All other components within normal limits  URINE CULTURE  RESP PANEL BY RT-PCR (FLU A&B, COVID) ARPGX2  CBC  MAGNESIUM  BRAIN NATRIURETIC PEPTIDE  URINALYSIS, ROUTINE W REFLEX MICROSCOPIC  RAPID URINE DRUG SCREEN, HOSP PERFORMED  ETHANOL    EKG EKG Interpretation  Date/Time:  Sunday June 22 2021 14:28:20 EDT Ventricular Rate:  102 PR Interval:  164 QRS Duration: 80 QT Interval:  364 QTC Calculation: 474 R Axis:   82 Text Interpretation: Sinus tachycardia Artifact Abnormal ECG Confirmed by Carmin Muskrat [phone]) on 06/22/2021 3:29:03 PM  Radiology DG Ribs Unilateral W/Chest Left  Result Date: 06/22/2021 CLINICAL DATA:  Left rib pain and shortness of breath since fall 2 weeks ago. EXAM: LEFT RIBS AND CHEST - 3+ VIEW COMPARISON:  Chest x-ray May 24, 2021 FINDINGS: No rib fractures identified. No pneumothorax. Mild opacity in the lateral right lung base was not seen on the May 16, 2021 comparison. No other acute abnormalities. IMPRESSION: 1. No cause for left-sided pain identified. No rib lesions or  fractures noted. 2. Mild opacity in the lateral right lung base may represent atelectasis or infiltrate. Recommend clinical correlation and follow-up imaging to ensure resolution. Electronically Signed   By: Dorise Bullion III M.D   On: 06/22/2021 15:59   CT Head Wo Contrast  Result Date: 06/22/2021 CLINICAL DATA:  Altered mental status. EXAM: CT HEAD WITHOUT CONTRAST TECHNIQUE: Contiguous axial images were obtained from the base of the skull through the vertex without intravenous contrast. COMPARISON:  February 11, 2021 FINDINGS: Brain: There is mild cerebral atrophy with widening of the extra-axial spaces and ventricular dilatation. There are  areas of decreased attenuation within the white matter tracts of the supratentorial brain, consistent with microvascular disease changes. Vascular: No hyperdense vessel or unexpected calcification. Skull: Normal. Negative for fracture or focal lesion. Sinuses/Orbits: No acute finding. Other: None. IMPRESSION: 1. Generalized cerebral atrophy. 2. No acute intracranial abnormality. Electronically Signed   By: Virgina Norfolk M.D.   On: 06/22/2021 17:00   CT Chest Wo Contrast  Result Date: 06/22/2021 CLINICAL DATA:  Chest trauma. EXAM: CT CHEST WITHOUT CONTRAST TECHNIQUE: Multidetector CT imaging of the chest was performed following the standard protocol without IV contrast. COMPARISON:  May 05, 2021 and August 09, 2020 FINDINGS: Cardiovascular: There is marked severity calcification of the aortic arch without evidence of aneurysmal dilatation. Normal heart size with marked severity coronary artery calcification. No pericardial effusion. Mediastinum/Nodes: No enlarged mediastinal or axillary lymph nodes. Thyroid gland, trachea, and esophagus demonstrate no significant findings. Lungs/Pleura: A stable 4 mm noncalcified lung nodule is seen within the posterior aspect of the right apex. Mild areas of scarring and/or atelectasis are seen within the bilateral lung bases.  There is no evidence of acute infiltrate, pleural effusion or pneumothorax. Upper Abdomen: There is a small hiatal hernia. Musculoskeletal: Chronic fracture deformities are seen involving the posterior eleventh right rib, sternum and T4 vertebral body. A compression fracture deformity of the T8 vertebral body is seen which represents a new finding when compared to the prior exam. Degenerative changes are seen throughout the thoracic spine IMPRESSION: 1. Multiple chronic fracture deformities, as described above, with a compression fracture deformity of the T8 vertebral body which is new since the prior study. 2. Stable, likely benign right apical lung nodule. 3. Small hiatal hernia. Electronically Signed   By: Virgina Norfolk M.D.   On: 06/22/2021 17:13   CT Angio Chest Pulmonary Embolism (PE) W or WO Contrast  Result Date: 06/22/2021 CLINICAL DATA:  Left chest pain EXAM: CT ANGIOGRAPHY CHEST WITH CONTRAST TECHNIQUE: Multidetector CT imaging of the chest was performed using the standard protocol during bolus administration of intravenous contrast. Multiplanar CT image reconstructions and MIPs were obtained to evaluate the vascular anatomy. CONTRAST:  22mL OMNIPAQUE IOHEXOL 350 MG/ML SOLN COMPARISON:  06/22/2021 FINDINGS: Cardiovascular: No filling defects in the pulmonary arteries to suggest pulmonary emboli. Heart is normal size. Aorta is normal caliber. Coronary artery and aortic calcifications. Mediastinum/Nodes: No mediastinal, hilar, or axillary adenopathy. Trachea and esophagus are unremarkable. Thyroid unremarkable. Lungs/Pleura: Mild emphysema. Patchy opacity at the right lung base could reflect atelectasis or infiltrate/pneumonia. Linear atelectasis at the left base. No effusions. Upper Abdomen: Imaging into the upper abdomen demonstrates no acute findings. Musculoskeletal: Compression deformity at T8 and through the superior endplate of T6. Review of the MIP images confirms the above findings.  IMPRESSION: No evidence of pulmonary embolus. Right lower lobe atelectasis or infiltrate. Compression fracture at T8 and the superior endplate of T6. Coronary artery disease. Aortic Atherosclerosis (ICD10-I70.0) and Emphysema (ICD10-J43.9). Electronically Signed   By: Rolm Baptise M.D.   On: 06/22/2021 22:51    Procedures Procedures   Medications Ordered in ED Medications  albuterol (VENTOLIN HFA) 108 (90 Base) MCG/ACT inhaler 2 puff (has no administration in time range)    ED Course  I have reviewed the triage vital signs and the nursing notes.  Pertinent labs & imaging results that were available during my care of the patient were reviewed by me and considered in my medical decision making (see chart for details).    MDM Rules/Calculators/A&P  69 year old female no acute distress, nontoxic appearing.  Patient is sleeping however easily arousable to voice.  Patient had to be alert to person, place, and time.  Patient has increased somnolence and is falling asleep multiple times during interview.  Patient is easily aroused each time.  Presents with complaint of left chest wall/rib pain after suffering a fall 2 weeks prior.  Patient has developed shortness of breath since this event which has progressively worsened.  Patient is now requiring 3 LPM of O2 via nasal cannula during the day which is new for her.  Chart review patient had oxygen saturation of 84% on room air at urgent care.  Patient's daughter reports that patient could not form coherent sentences and had increased somnolence when she checked on her today at 1130.  Daughter states she has had similar episodes when CHF has been exacerbated.  On physical exam patient has no focal neurological deficits.  Decreased lung sounds to left lung fields.  No wheezing, rales, or rhonchi.  No flail chest, contusion, ecchymosis, or tenderness to left chest wall.  Head is atraumatic.  No midline tenderness or deformity to  cervical, thoracic, or lumbar spine.  Pupils 4 mm bilaterally and PERRL.  Will obtain lab work to look for x-ray source of patient's confusion and new oxygen requirement.  Will obtain CT chest and head.  Chest x-ray shows no rib lesions or fractures noted. Mild opacity in the lateral right lung base may represent atelectasis or infiltrate.   Will obtain follow-up CT chest for further evaluation. CT chest shows multiple chronic fracture deformity of, new compression fracture deformity HPI vertebral body.  Stable right apical lung nodule.  Small hiatal hernia.  CT head shows no acute intracranial abnormality.  CBC, BMP, magnesium, hepatic function, BNP are unremarkable Respiratory panel negative for COVID-19 or influenza Ethanol less than 10 UDS positive for opiates Urinalysis shows no signs of infection.  No acute cause found for patient's new oxygen requirement.  Suspect patient may not be taking full deep breaths.  Complains of lumbar pain and that this is contributing to patient's decreased oxygen saturations on room air.    Will consult hospitalist due to patient's new oxygen requirement.  2040 spoke to hospitalist Dr. Roel Cluck who will see the patient for admission.  Final Clinical Impression(s) / ED Diagnoses Final diagnoses:  None    Rx / DC Orders ED Discharge Orders     None        Dyann Ruddle 06/22/21 2345    Carmin Muskrat, MD 06/23/21 [phone]

## 2021-06-22 NOTE — ED Triage Notes (Addendum)
Pt was sent from [facility] due to 84% RA spo2 initially for UCC>  EMS found her to be 93% on RA during transport.  Pt had a fall 2 weeks ago and has left sided rib pain since and sob.  Pt was not seen for this prior to today

## 2021-06-22 NOTE — ED Notes (Signed)
Lab to add on hepatic function, BNP, and Magnesium.

## 2021-06-22 NOTE — ED Notes (Signed)
Pt resting comfortably, denies complaints at this time.

## 2021-06-22 NOTE — ED Notes (Signed)
Patient is being discharged from the Urgent Care and sent to the Emergency Department via EMS. Per Corning PA, patient is in need of higher level of care due to hypoxia and chest pain. Patient is aware and verbalizes understanding of plan of care.  Vitals:   06/22/21 1352 06/22/21 1354  BP:  120/77  Pulse: (!) 106   Temp:  99.2 F (37.3 C)  SpO2: (!) 84%

## 2021-06-23 ENCOUNTER — Observation Stay (HOSPITAL_COMMUNITY): Payer: Medicare HMO

## 2021-06-23 ENCOUNTER — Encounter (HOSPITAL_COMMUNITY): Payer: Self-pay | Admitting: Internal Medicine

## 2021-06-23 DIAGNOSIS — F419 Anxiety disorder, unspecified: Secondary | ICD-10-CM | POA: Diagnosis present

## 2021-06-23 DIAGNOSIS — J449 Chronic obstructive pulmonary disease, unspecified: Secondary | ICD-10-CM | POA: Diagnosis not present

## 2021-06-23 DIAGNOSIS — J9621 Acute and chronic respiratory failure with hypoxia: Secondary | ICD-10-CM | POA: Diagnosis not present

## 2021-06-23 DIAGNOSIS — Z800 Family history of malignant neoplasm of digestive organs: Secondary | ICD-10-CM | POA: Diagnosis not present

## 2021-06-23 DIAGNOSIS — K219 Gastro-esophageal reflux disease without esophagitis: Secondary | ICD-10-CM | POA: Diagnosis present

## 2021-06-23 DIAGNOSIS — R69 Illness, unspecified: Secondary | ICD-10-CM | POA: Diagnosis not present

## 2021-06-23 DIAGNOSIS — J9601 Acute respiratory failure with hypoxia: Secondary | ICD-10-CM | POA: Diagnosis not present

## 2021-06-23 DIAGNOSIS — J9811 Atelectasis: Secondary | ICD-10-CM | POA: Diagnosis not present

## 2021-06-23 DIAGNOSIS — G9341 Metabolic encephalopathy: Secondary | ICD-10-CM | POA: Diagnosis not present

## 2021-06-23 DIAGNOSIS — Z79891 Long term (current) use of opiate analgesic: Secondary | ICD-10-CM | POA: Diagnosis not present

## 2021-06-23 DIAGNOSIS — Z20822 Contact with and (suspected) exposure to covid-19: Secondary | ICD-10-CM | POA: Diagnosis not present

## 2021-06-23 DIAGNOSIS — I110 Hypertensive heart disease with heart failure: Secondary | ICD-10-CM | POA: Diagnosis not present

## 2021-06-23 DIAGNOSIS — T426X1A Poisoning by other antiepileptic and sedative-hypnotic drugs, accidental (unintentional), initial encounter: Secondary | ICD-10-CM | POA: Diagnosis present

## 2021-06-23 DIAGNOSIS — R296 Repeated falls: Secondary | ICD-10-CM | POA: Diagnosis present

## 2021-06-23 DIAGNOSIS — R0602 Shortness of breath: Secondary | ICD-10-CM | POA: Diagnosis not present

## 2021-06-23 DIAGNOSIS — Z28310 Unvaccinated for covid-19: Secondary | ICD-10-CM | POA: Diagnosis not present

## 2021-06-23 DIAGNOSIS — I5032 Chronic diastolic (congestive) heart failure: Secondary | ICD-10-CM | POA: Diagnosis not present

## 2021-06-23 DIAGNOSIS — Z9981 Dependence on supplemental oxygen: Secondary | ICD-10-CM | POA: Diagnosis not present

## 2021-06-23 DIAGNOSIS — E785 Hyperlipidemia, unspecified: Secondary | ICD-10-CM | POA: Diagnosis not present

## 2021-06-23 DIAGNOSIS — R410 Disorientation, unspecified: Secondary | ICD-10-CM | POA: Diagnosis not present

## 2021-06-23 DIAGNOSIS — E538 Deficiency of other specified B group vitamins: Secondary | ICD-10-CM | POA: Diagnosis present

## 2021-06-23 DIAGNOSIS — E860 Dehydration: Secondary | ICD-10-CM | POA: Diagnosis present

## 2021-06-23 DIAGNOSIS — F17210 Nicotine dependence, cigarettes, uncomplicated: Secondary | ICD-10-CM | POA: Diagnosis present

## 2021-06-23 DIAGNOSIS — Z79899 Other long term (current) drug therapy: Secondary | ICD-10-CM | POA: Diagnosis not present

## 2021-06-23 DIAGNOSIS — J9622 Acute and chronic respiratory failure with hypercapnia: Secondary | ICD-10-CM | POA: Diagnosis not present

## 2021-06-23 DIAGNOSIS — G894 Chronic pain syndrome: Secondary | ICD-10-CM | POA: Diagnosis present

## 2021-06-23 DIAGNOSIS — Z8249 Family history of ischemic heart disease and other diseases of the circulatory system: Secondary | ICD-10-CM | POA: Diagnosis not present

## 2021-06-23 DIAGNOSIS — Z808 Family history of malignant neoplasm of other organs or systems: Secondary | ICD-10-CM | POA: Diagnosis not present

## 2021-06-23 DIAGNOSIS — T402X1A Poisoning by other opioids, accidental (unintentional), initial encounter: Secondary | ICD-10-CM | POA: Diagnosis present

## 2021-06-23 LAB — CBC WITH DIFFERENTIAL/PLATELET
Abs Immature Granulocytes: 0.03 10*3/uL (ref 0.00–0.07)
Basophils Absolute: 0 10*3/uL (ref 0.0–0.1)
Basophils Relative: 0 %
Eosinophils Absolute: 0.2 10*3/uL (ref 0.0–0.5)
Eosinophils Relative: 2 %
HCT: 38.5 % (ref 36.0–46.0)
Hemoglobin: 12 g/dL (ref 12.0–15.0)
Immature Granulocytes: 0 %
Lymphocytes Relative: 23 %
Lymphs Abs: 2.3 10*3/uL (ref 0.7–4.0)
MCH: 30.8 pg (ref 26.0–34.0)
MCHC: 31.2 g/dL (ref 30.0–36.0)
MCV: 98.7 fL (ref 80.0–100.0)
Monocytes Absolute: 0.8 10*3/uL (ref 0.1–1.0)
Monocytes Relative: 8 %
Neutro Abs: 6.5 10*3/uL (ref 1.7–7.7)
Neutrophils Relative %: 67 %
Platelets: 280 10*3/uL (ref 150–400)
RBC: 3.9 MIL/uL (ref 3.87–5.11)
RDW: 13.1 % (ref 11.5–15.5)
WBC: 9.8 10*3/uL (ref 4.0–10.5)
nRBC: 0 % (ref 0.0–0.2)

## 2021-06-23 LAB — MAGNESIUM: Magnesium: 1.8 mg/dL (ref 1.7–2.4)

## 2021-06-23 LAB — COMPREHENSIVE METABOLIC PANEL
ALT: 7 U/L (ref 0–44)
AST: 16 U/L (ref 15–41)
Albumin: 3.2 g/dL — ABNORMAL LOW (ref 3.5–5.0)
Alkaline Phosphatase: 63 U/L (ref 38–126)
Anion gap: 8 (ref 5–15)
BUN: 6 mg/dL — ABNORMAL LOW (ref 8–23)
CO2: 26 mmol/L (ref 22–32)
Calcium: 7.8 mg/dL — ABNORMAL LOW (ref 8.9–10.3)
Chloride: 106 mmol/L (ref 98–111)
Creatinine, Ser: 0.74 mg/dL (ref 0.44–1.00)
GFR, Estimated: >60 mL/min (ref >60)
Glucose, Bld: 90 mg/dL (ref 70–99)
Potassium: 4.7 mmol/L (ref 3.5–5.1)
Sodium: 140 mmol/L (ref 135–145)
Total Bilirubin: 0.7 mg/dL (ref 0.3–1.2)
Total Protein: 6.1 g/dL — ABNORMAL LOW (ref 6.5–8.1)

## 2021-06-23 LAB — BLOOD GAS, VENOUS
Acid-Base Excess: 0.4 mmol/L (ref 0.0–2.0)
Bicarbonate: 26.6 mmol/L (ref 20.0–28.0)
Drawn by: 6344
O2 Saturation: 63.1 %
Patient temperature: 37
pCO2, Ven: 60.2 mmHg — ABNORMAL HIGH (ref 44.0–60.0)
pH, Ven: 7.268 (ref 7.250–7.430)
pO2, Ven: 32.9 mmHg (ref 32.0–45.0)

## 2021-06-23 LAB — PHOSPHORUS: Phosphorus: 2.2 mg/dL — ABNORMAL LOW (ref 2.5–4.6)

## 2021-06-23 LAB — T4, FREE: Free T4: 0.68 ng/dL (ref 0.61–1.12)

## 2021-06-23 LAB — TSH: TSH: 0.174 u[IU]/mL — ABNORMAL LOW (ref 0.350–4.500)

## 2021-06-23 MED ORDER — POTASSIUM PHOSPHATES 15 MMOLE/5ML IV SOLN
30.0000 mmol | Freq: Once | INTRAVENOUS | Status: AC
  Administered 2021-06-23: 30 mmol via INTRAVENOUS
  Filled 2021-06-23: qty 10

## 2021-06-23 MED ORDER — OXYCODONE HCL 5 MG PO TABS
5.0000 mg | ORAL_TABLET | Freq: Three times a day (TID) | ORAL | Status: DC | PRN
  Administered 2021-06-23 – 2021-06-24 (×3): 5 mg via ORAL
  Filled 2021-06-23 (×3): qty 1

## 2021-06-23 MED ORDER — DOCUSATE SODIUM 100 MG PO CAPS
200.0000 mg | ORAL_CAPSULE | Freq: Two times a day (BID) | ORAL | Status: DC
  Filled 2021-06-23: qty 2

## 2021-06-23 MED ORDER — VALACYCLOVIR HCL 500 MG PO TABS
500.0000 mg | ORAL_TABLET | Freq: Two times a day (BID) | ORAL | Status: DC | PRN
  Filled 2021-06-23: qty 1

## 2021-06-23 MED ORDER — NALOXONE HCL 0.4 MG/ML IJ SOLN
0.4000 mg | INTRAMUSCULAR | Status: DC | PRN
  Administered 2021-06-23: 0.4 mg via INTRAVENOUS
  Filled 2021-06-23: qty 1

## 2021-06-23 MED ORDER — POLYETHYLENE GLYCOL 3350 17 G PO PACK: 17.0000 g | PACK | Freq: Two times a day (BID) | ORAL | Status: DC

## 2021-06-23 MED ORDER — ONDANSETRON HCL 4 MG/2ML IJ SOLN
4.0000 mg | Freq: Four times a day (QID) | INTRAMUSCULAR | Status: DC | PRN
  Administered 2021-06-23 – 2021-06-24 (×2): 4 mg via INTRAVENOUS
  Filled 2021-06-23 (×2): qty 2

## 2021-06-23 NOTE — Progress Notes (Signed)
PROGRESS NOTE                                                                                                                                                                                                             Patient Demographics:    Denise King, is a 70 y.o. female, DOB - [birth year], OIT:[id number]  Outpatient Primary MD for the patient is Biagio Borg, MD    LOS - 0  Admit date - 06/22/2021    Chief Complaint  Patient presents with   Shortness of Breath       Brief Narrative (HPI from H&P)  - Denise King is a 70 y.o. female with medical history significant of COPD on home oxygen 2 L as needed, chronic diastolic CHF with EF 58% on recent echocardiogram.  Patient is on very high doses of narcotics and sedative medications and has had multiple falls at home, she has chronic back pain, she came to the ER with chief complaints of shortness of breath and confusion.  Was found to have acute on chronic hypoxic and hypercapnic respiratory failure likely due to overuse of narcotics and admitted to [facility].      Subjective:    Denise King today has, No headache, No chest pain, No abdominal pain - No Nausea, No new weakness tingling or numbness, no shortness of breath, chronic low back pain unchanged in its pattern.   Assessment  & Plan :      Acute Hypoxic & Hypercapnic Resp. Failure causing metabolic encephalopathy and multiple falls - she is on multiple sedative medications including high doses of narcotics and muscle relaxants, holding of offending medications has already helped and her mentation is much better, she is currently symptom-free and using 1 L nasal cannula oxygen.  She and her husband have been extensively counseled to cut down on narcotics and sedative medications on a urgent basis.  They have been clearly told that she is heading towards accidental overdose and death.  Will monitor  on low-dose narcotics for 24 hours if stable discharge tomorrow.  Encouraged the patient to sit up in chair in the daytime use I-S and flutter valve for pulmonary toiletry.  Will advance activity and titrate down oxygen as possible.   2.  Multiple T-spine fractures 1 somewhat acute, no midline spinal step in deformity, no  change in chronic back pain pattern, no paresthesias or focal weakness.  Supportive care.  Minimize narcotic and sedative use as above.  PT OT evaluation, rolling walker if needed, will discuss with neurosurgery if she will benefit from TLSO brace.  3.  Chronic pain and anxiety.  On multiple narcotics and sedative medications.  We will cautiously resume at low-dose to avoid abrupt withdrawal.  4.  COPD with ongoing smoking.  Counseled to quit smoking.  Supportive care.  Currently no wheezing or acute issues  5.  GERD.  On PPI.  6.  Dyslipidemia.  On statin.  7. HTN - blood pressure is soft hold blood pressure medications and hydrate.  8.  Chronic diastolic CHF EF 02% on recent echocardiogram.  Currently dehydrated.  Hydrate and hold diuretics.  9.  Narcotic bowel with stool burden.  Placed on aggressive bowel regimen.       Condition - Fair  Family Communication  : husband Denise King [phone] on 06/23/21  Code Status :  Full  Consults  :  N Surg - phone  PUD Prophylaxis : PPI   Procedures  :     CT Head - Non acute  CT Abd Pelvis - 1. No evidence of bowel obstruction. Moderate colonic stool burden predominately in the RIGHT and transverse colon. 2. Mildly nodular contour of the liver could reflect underlying cirrhosis. Aortic Atherosclerosis (ICD10-I70.0).  CT Chest- 1. Multiple chronic fracture deformities, as described above, with a compression fracture deformity of the T8 vertebral body which is new since the prior study. 2. Stable, likely benign right apical lung nodule. 3. Small hiatal hernia.      Disposition Plan  :    Status is:  Observation   Dispo: The patient is from: Home              Anticipated d/c is to: Home              Patient currently is not medically stable to d/c.   Difficult to place patient No  DVT Prophylaxis  :    enoxaparin (LOVENOX) injection 40 mg Start: 06/22/21 2315 SCDs Start: 06/22/21 2310    Lab Results  Component Value Date   PLT 280 06/23/2021    Diet :  Diet Order             Diet Heart Room service appropriate? Yes; Fluid consistency: Thin  Diet effective now                    Inpatient Medications  Scheduled Meds:  budesonide  9 mg Oral Daily   buPROPion  150 mg Oral Daily   diclofenac  1 patch Transdermal BID   enoxaparin (LOVENOX) injection  40 mg Subcutaneous QHS   nicotine  21 mg Transdermal Daily   pantoprazole  40 mg Oral BID AC   rosuvastatin  5 mg Oral Q M,W,F-1800   senna-docusate  2 tablet Oral BID   sertraline  200 mg Oral QHS   Continuous Infusions:  potassium PHOSPHATE IVPB (in mmol)     PRN Meds:.acetaminophen **OR** [DISCONTINUED] acetaminophen, albuterol, ketorolac, naLOXone (NARCAN)  injection  Antibiotics  :    Anti-infectives (From admission, onward)    None        Time Spent in minutes  30   Lala Lund M.D on 06/23/2021 at 11:40 AM  To page go to [email or web address]   [facility] -  Office  [phone]    See all Orders from today  for further details    Objective:   Vitals:   06/22/21 2300 06/23/21 0430 06/23/21 0800 06/23/21 1139  BP: 137/70 (!) 110/59 97/68   Pulse: 92 89 89 90  Resp: 16   16  Temp: 98.2 F (36.8 C) 97.7 F (36.5 C)    TempSrc: Oral Oral    SpO2: 97% 97% 94% 94%  Weight: 52.1 kg 51.8 kg    Height: 5\' 3"  (1.6 m)       Wt Readings from Last 3 Encounters:  06/23/21 51.8 kg  06/04/21 50.8 kg  05/24/21 60 kg     Intake/Output Summary (Last 24 hours) at 06/23/2021 1140 Last data filed at 06/23/2021 0802 Gross per 24 hour  Intake 120 ml  Output --  Net 120 ml     Physical  Exam  Awake Alert, No new F.N deficits, Normal affect [place].AT,PERRAL Supple Neck,No JVD, No cervical lymphadenopathy appriciated.  Symmetrical Chest wall movement, Good air movement bilaterally, CTAB RRR,No Gallops,Rubs or new Murmurs, No Parasternal Heave +ve B.Sounds, Abd Soft, No tenderness, No organomegaly appriciated, No rebound - guarding or rigidity. No Cyanosis, Clubbing or edema, No new Rash or bruise        Data Review:    CBC Recent Labs  Lab 06/22/21 1446 06/22/21 2118 06/23/21 0350  WBC 9.6  --  9.8  HGB 12.1 13.3 12.0  HCT 38.0 39.0 38.5  PLT 290  --  280  MCV 97.9  --  98.7  MCH 31.2  --  30.8  MCHC 31.8  --  31.2  RDW 13.0  --  13.1  LYMPHSABS  --   --  2.3  MONOABS  --   --  0.8  EOSABS  --   --  0.2  BASOSABS  --   --  0.0    Recent Labs  Lab 06/22/21 1446 06/22/21 1532 06/22/21 2101 06/22/21 2118 06/22/21 2300 06/23/21 0350  NA 136  --   --  139  --  140  K 4.1  --   --  4.4  --  4.7  CL 103  --   --   --   --  106  CO2 25  --   --   --   --  26  GLUCOSE 109*  --   --   --   --  90  BUN 7*  --   --   --   --  6*  CREATININE 0.76  --   --   --   --  0.74  CALCIUM 7.5*  --   --   --   --  7.8*  AST  --  18  --   --   --  16  ALT  --  9  --   --   --  7  ALKPHOS  --  64  --   --   --  63  BILITOT  --  0.4  --   --   --  0.7  ALBUMIN  --  3.4*  --   --   --  3.2*  MG  --  1.8  --   --   --  1.8  DDIMER  --   --   --   --  0.66*  --   TSH  --   --   --   --   --  0.174*  AMMONIA  --   --  27  --   --   --  BNP  --  54.3  --   --   --   --     ------------------------------------------------------------------------------------------------------------------ No results for input(s): CHOL, HDL, LDLCALC, TRIG, CHOLHDL, LDLDIRECT in the last 72 hours.  Lab Results  Component Value Date   HGBA1C 5.2 10/01/2018   ------------------------------------------------------------------------------------------------------------------ Recent Labs     06/23/21 0350  TSH 0.174*    Cardiac Enzymes No results for input(s): CKMB, TROPONINI, MYOGLOBIN in the last 168 hours.  Invalid input(s): CK ------------------------------------------------------------------------------------------------------------------    Component Value Date/Time   BNP 54.3 06/22/2021 1532    Micro Results Recent Results (from the past 240 hour(s))  Resp Panel by RT-PCR (Flu A&B, Covid) Nasopharyngeal Swab     Status: None   Collection Time: 06/22/21  3:33 PM   Specimen: Nasopharyngeal Swab; Nasopharyngeal(NP) swabs in vial transport medium  Result Value Ref Range Status   SARS Coronavirus 2 by RT PCR NEGATIVE NEGATIVE Final    Comment: (NOTE) SARS-CoV-2 target nucleic acids are NOT DETECTED.  The SARS-CoV-2 RNA is generally detectable in upper respiratory specimens during the acute phase of infection. The lowest concentration of SARS-CoV-2 viral copies this assay can detect is 138 copies/mL. A negative result does not preclude SARS-Cov-2 infection and should not be used as the sole basis for treatment or other patient management decisions. A negative result may occur with  improper specimen collection/handling, submission of specimen other than nasopharyngeal swab, presence of viral mutation(s) within the areas targeted by this assay, and inadequate number of viral copies(<138 copies/mL). A negative result must be combined with clinical observations, patient history, and epidemiological information. The expected result is Negative.  Fact Sheet for Patients:  EntrepreneurPulse.com.au  Fact Sheet for Healthcare Providers:  IncredibleEmployment.be  This test is no t yet approved or cleared by the [place] FDA and  has been authorized for detection and/or diagnosis of SARS-CoV-2 by FDA under an Emergency Use Authorization (EUA). This EUA will remain  in effect (meaning this test can be used) for the duration of  the COVID-19 declaration under Section 564(b)(1) of the Act, 21 U.S.C.section 360bbb-3(b)(1), unless the authorization is terminated  or revoked sooner.       Influenza A by PCR NEGATIVE NEGATIVE Final   Influenza B by PCR NEGATIVE NEGATIVE Final    Comment: (NOTE) The Xpert Xpress SARS-CoV-2/FLU/RSV plus assay is intended as an aid in the diagnosis of influenza from Nasopharyngeal swab specimens and should not be used as a sole basis for treatment. Nasal washings and aspirates are unacceptable for Xpert Xpress SARS-CoV-2/FLU/RSV testing.  Fact Sheet for Patients: EntrepreneurPulse.com.au  Fact Sheet for Healthcare Providers: IncredibleEmployment.be  This test is not yet approved or cleared by the [place] FDA and has been authorized for detection and/or diagnosis of SARS-CoV-2 by FDA under an Emergency Use Authorization (EUA). This EUA will remain in effect (meaning this test can be used) for the duration of the COVID-19 declaration under Section 564(b)(1) of the Act, 21 U.S.C. section 360bbb-3(b)(1), unless the authorization is terminated or revoked.  Performed at [facility], [place] [address], [place],  [address]     Radiology Reports CT ABDOMEN PELVIS WO CONTRAST  Result Date: 05/24/2021 CLINICAL DATA:  Constipation EXAM: CT ABDOMEN AND PELVIS WITHOUT CONTRAST TECHNIQUE: Multidetector CT imaging of the abdomen and pelvis was performed following the standard protocol without IV contrast. COMPARISON:  May 17, 2021 FINDINGS: Lower chest: RIGHT lower lobe atelectasis. Hepatobiliary: Mildly nodular contour of the liver could reflect underlying cirrhosis.  Status post cholecystectomy. Unchanged appearance of a prominent common bile duct, likely due to post cholecystectomy state. Pancreas: Unremarkable noncontrast appearance. Spleen: Unremarkable. Adrenals/Urinary Tract: Likely nonobstructive RIGHT-sided nephrolithiasis, unchanged.  Calcifications of the LEFT kidney are favored to be vascular in origin. No hydronephrosis. Bladder is decompressed. Adrenal glands are unremarkable. Stomach/Bowel: No bowel obstruction. Moderate colonic stool burden of predominantly the RIGHT and transverse colon. Status post appendectomy. No new focal bowel wall inflammation. Small hiatal hernia. Vascular/Lymphatic: Severe atherosclerotic calcifications. No new suspicious lymphadenopathy. Reproductive: Uterus and bilateral adnexa are unremarkable. Other: No free air or free fluid. Musculoskeletal: Osteopenia. Status post posterior fixation of the lower lumbar spine. Status post intramedullary rod fixation of the RIGHT femur. Similar trace lucency adjacent to the RIGHT inferior screw of lumbar spinal hardware. Remote RIGHT posterior eleventh rib fracture. IMPRESSION: 1. No evidence of bowel obstruction. Moderate colonic stool burden predominately in the RIGHT and transverse colon. 2. Mildly nodular contour of the liver could reflect underlying cirrhosis. Aortic Atherosclerosis (ICD10-I70.0). Electronically Signed   By: Valentino Saxon MD   On: 05/24/2021 13:15   DG Ribs Unilateral W/Chest Left  Result Date: 06/22/2021 CLINICAL DATA:  Left rib pain and shortness of breath since fall 2 weeks ago. EXAM: LEFT RIBS AND CHEST - 3+ VIEW COMPARISON:  Chest x-ray May 24, 2021 FINDINGS: No rib fractures identified. No pneumothorax. Mild opacity in the lateral right lung base was not seen on the May 16, 2021 comparison. No other acute abnormalities. IMPRESSION: 1. No cause for left-sided pain identified. No rib lesions or fractures noted. 2. Mild opacity in the lateral right lung base may represent atelectasis or infiltrate. Recommend clinical correlation and follow-up imaging to ensure resolution. Electronically Signed   By: Dorise Bullion III M.D   On: 06/22/2021 15:59   CT Head Wo Contrast  Result Date: 06/22/2021 CLINICAL DATA:  Altered mental status. EXAM: CT  HEAD WITHOUT CONTRAST TECHNIQUE: Contiguous axial images were obtained from the base of the skull through the vertex without intravenous contrast. COMPARISON:  February 11, 2021 FINDINGS: Brain: There is mild cerebral atrophy with widening of the extra-axial spaces and ventricular dilatation. There are areas of decreased attenuation within the white matter tracts of the supratentorial brain, consistent with microvascular disease changes. Vascular: No hyperdense vessel or unexpected calcification. Skull: Normal. Negative for fracture or focal lesion. Sinuses/Orbits: No acute finding. Other: None. IMPRESSION: 1. Generalized cerebral atrophy. 2. No acute intracranial abnormality. Electronically Signed   By: Virgina Norfolk M.D.   On: 06/22/2021 17:00   CT Chest Wo Contrast  Result Date: 06/22/2021 CLINICAL DATA:  Chest trauma. EXAM: CT CHEST WITHOUT CONTRAST TECHNIQUE: Multidetector CT imaging of the chest was performed following the standard protocol without IV contrast. COMPARISON:  May 05, 2021 and August 09, 2020 FINDINGS: Cardiovascular: There is marked severity calcification of the aortic arch without evidence of aneurysmal dilatation. Normal heart size with marked severity coronary artery calcification. No pericardial effusion. Mediastinum/Nodes: No enlarged mediastinal or axillary lymph nodes. Thyroid gland, trachea, and esophagus demonstrate no significant findings. Lungs/Pleura: A stable 4 mm noncalcified lung nodule is seen within the posterior aspect of the right apex. Mild areas of scarring and/or atelectasis are seen within the bilateral lung bases. There is no evidence of acute infiltrate, pleural effusion or pneumothorax. Upper Abdomen: There is a small hiatal hernia. Musculoskeletal: Chronic fracture deformities are seen involving the posterior eleventh right rib, sternum and T4 vertebral body. A compression fracture deformity of the T8 vertebral body is  seen which represents a new finding when  compared to the prior exam. Degenerative changes are seen throughout the thoracic spine IMPRESSION: 1. Multiple chronic fracture deformities, as described above, with a compression fracture deformity of the T8 vertebral body which is new since the prior study. 2. Stable, likely benign right apical lung nodule. 3. Small hiatal hernia. Electronically Signed   By: Virgina Norfolk M.D.   On: 06/22/2021 17:13   CT Angio Chest Pulmonary Embolism (PE) W or WO Contrast  Result Date: 06/22/2021 CLINICAL DATA:  Left chest pain EXAM: CT ANGIOGRAPHY CHEST WITH CONTRAST TECHNIQUE: Multidetector CT imaging of the chest was performed using the standard protocol during bolus administration of intravenous contrast. Multiplanar CT image reconstructions and MIPs were obtained to evaluate the vascular anatomy. CONTRAST:  110mL OMNIPAQUE IOHEXOL 350 MG/ML SOLN COMPARISON:  06/22/2021 FINDINGS: Cardiovascular: No filling defects in the pulmonary arteries to suggest pulmonary emboli. Heart is normal size. Aorta is normal caliber. Coronary artery and aortic calcifications. Mediastinum/Nodes: No mediastinal, hilar, or axillary adenopathy. Trachea and esophagus are unremarkable. Thyroid unremarkable. Lungs/Pleura: Mild emphysema. Patchy opacity at the right lung base could reflect atelectasis or infiltrate/pneumonia. Linear atelectasis at the left base. No effusions. Upper Abdomen: Imaging into the upper abdomen demonstrates no acute findings. Musculoskeletal: Compression deformity at T8 and through the superior endplate of T6. Review of the MIP images confirms the above findings. IMPRESSION: No evidence of pulmonary embolus. Right lower lobe atelectasis or infiltrate. Compression fracture at T8 and the superior endplate of T6. Coronary artery disease. Aortic Atherosclerosis (ICD10-I70.0) and Emphysema (ICD10-J43.9). Electronically Signed   By: Rolm Baptise M.D.   On: 06/22/2021 22:51   MR THORACIC SPINE WO CONTRAST  Result Date:  05/24/2021 CLINICAL DATA:  Mid back pain. EXAM: MRI THORACIC SPINE WITHOUT CONTRAST TECHNIQUE: Multiplanar, multisequence MR imaging of the thoracic spine was performed. No intravenous contrast was administered. COMPARISON:  Chest radiograph May 24, 2021 and chest CT May 05, 2021. FINDINGS: Motion limited evaluation.  Within this limitation: Alignment: Exaggerated thoracic kyphosis. Reverse S-shaped thoracolumbar curvature. Vertebrae: Approximately 40% height loss of T7 vertebral body with associated bone marrow edema, compatible with acute/recent compression fracture. There is mild surrounding paraspinous edema. Slight bony retropulsion along the superior endplate without significant canal stenosis. Mild bilateral foraminal stenosis at T7-T8. Remote T4 compression fracture with approximately 40% height loss and no bone marrow edema. Height loss is similar to priors. Cord:  Normal cord signal. Paraspinal and other soft tissues: Unremarkable. Partially imaged left cyst. Disc levels: Motion limited evaluation without evidence of significant canal stenosis. Small right eccentric disc bulge at T8-T9 partially effaces CSF. Slight bony retropulsion T7 also slightly root effaces ventral CSF. Mild foraminal stenosis bilaterally at T7 T8 and on the left at T8-T9 and T9-T10. IMPRESSION: 1. Acute/recent T7 compression fracture with approximately 40% height loss. Slight bony retropulsion without significant canal stenosis. 2. Remote T4 compression fracture without progressive height loss. 3. Multilevel mild foraminal stenosis is detailed above. Electronically Signed   By: Margaretha Sheffield MD   On: 05/24/2021 14:36   MR LUMBAR SPINE WO CONTRAST  Result Date: 05/24/2021 CLINICAL DATA:  Low back pain. EXAM: MRI LUMBAR SPINE WITHOUT CONTRAST TECHNIQUE: Multiplanar, multisequence MR imaging of the lumbar spine was performed. No intravenous contrast was administered. COMPARISON:  May 26, 2017. FINDINGS: Segmentation: The  inferior-most fully formed intervertebral disc is labeled L5-S1, consistent with prior numbering. Alignment: Moderate rotatory dextrocurvature. Grade 1 retrolisthesis of L3 on L4, similar to prior. Vertebrae:  Vertebral body heights are maintained. No specific evidence of acute fracture or discitis/osteomyelitis, although metallic artifact from posterior fusion hardware at L4-S1 limits evaluation. Conus medullaris and cauda equina: The conus is not imaged on this study. The conus appears unremarkable on concurrent MRI of the thoracic spine and terminates at the superior L1 level. Paraspinal and other soft tissues: Bilateral renal cyst including a complex cyst in the interpolar right kidney which is similar in size (approximately 8 mm). Additional complex right renal cyst or not imaged on the prior. Disc levels: Motion limited evaluation.  Within this limitation: T12-L1: Not imaged. L1-L2: Small right foraminal disc protrusion without significant canal or foraminal stenosis. L2-L3: Mild bilateral facet hypertrophy and small bilateral foraminal disc protrusions without significant canal or foraminal stenosis. L3-L4: Degenerative disc height loss and endplate changes. Right eccentric disc bulge with endplate spurring and mild bilateral facet hypertrophy. Resulting moderate right and mild left foraminal stenosis, likely mildly progressed from the prior. Similar mild to moderate right subarticular recess stenosis and mild canal stenosis. L4-L5: Posterior fusion and decompression. No significant canal or foraminal stenosis. L5-S1: Mild disc bulging and right greater than left facet hypertrophy with mild right and borderline left foraminal stenosis, similar. IMPRESSION: 1. L4-L5 PLIF. Motion limited evaluation with suspected mild progression of adjacent level degenerative change at L3-L4 where there is moderate right and mild left foraminal stenosis, mild to moderate right subarticular recess stenosis, and mild canal  stenosis. 2. Similar L5-S1 degenerative change with suspected mild right foraminal stenosis. 3. Similar size of an 8 mm complex right interpolar cyst. Additional complex right renal cysts were not imaged on the prior. Consider non urgent renal ultrasound to further evaluate. Electronically Signed   By: Margaretha Sheffield MD   On: 05/24/2021 14:52   DG Chest Port 1 View  Result Date: 06/23/2021 CLINICAL DATA:  Shortness of breath EXAM: PORTABLE CHEST 1 VIEW COMPARISON:  June 22, 2021 chest radiograph and chest CT FINDINGS: Persistent ill-defined opacity right base noted. Lungs otherwise clear. Heart size and pulmonary vascularity are normal. No adenopathy. There is aortic atherosclerosis. No bone lesions. IMPRESSION: Apparent atelectasis with questionable superimposed pneumonia right base. Lungs otherwise clear. Heart size normal. Aortic Atherosclerosis (ICD10-I70.0). Electronically Signed   By: Lowella Grip III M.D.   On: 06/23/2021 08:15
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
## 2021-06-23 NOTE — Progress Notes (Signed)
Patient stated she was breathing fine on room air with Sp02=95%. B/S clear with no signs of increased worl of breathing. Bipap on standby at this time, will continue to monitor patient.

## 2021-06-23 NOTE — Progress Notes (Signed)
Patient able to remain alert for longer periods after receiving Narcan but continues to doze off.  Complaining of all over pain and not feeling well after Narcan. Patient placed on BiPap. Patient intermittently awaking in pain and pulling at mask.

## 2021-06-23 NOTE — Progress Notes (Signed)
Orthopedic Tech Progress Note Patient Details:  YVONNE STOPHER [birth year] [id number]  Went to service patient with a TLSO BRACE but stated" she has this time of brace at home".. I asked her if she could have her love one bring the brace up here when she had a chance, she asked if it to be worn at all times I said no, per order just when up " walking"   Patient ID: NIKO JAKEL, female   DOB: 1951/11/01, 71 y.o.   MRN: [id number]  Janit Pagan 06/23/2021, 1:30 PM

## 2021-06-23 NOTE — Progress Notes (Signed)
SATURATION QUALIFICATIONS: (This note is used to comply with regulatory documentation for home oxygen)  Patient Saturations on Room Air at Rest = 94%  Patient Saturations on Room Air while Ambulating = 92%  
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
## 2021-06-24 DIAGNOSIS — R0602 Shortness of breath: Secondary | ICD-10-CM | POA: Diagnosis not present

## 2021-06-24 LAB — CBC WITH DIFFERENTIAL/PLATELET
Abs Immature Granulocytes: 0.02 10*3/uL (ref 0.00–0.07)
Basophils Absolute: 0 10*3/uL (ref 0.0–0.1)
Basophils Relative: 0 %
Eosinophils Absolute: 0.1 10*3/uL (ref 0.0–0.5)
Eosinophils Relative: 2 %
HCT: 36 % (ref 36.0–46.0)
Hemoglobin: 11.5 g/dL — ABNORMAL LOW (ref 12.0–15.0)
Immature Granulocytes: 0 %
Lymphocytes Relative: 40 %
Lymphs Abs: 2.9 10*3/uL (ref 0.7–4.0)
MCH: 30.8 pg (ref 26.0–34.0)
MCHC: 31.9 g/dL (ref 30.0–36.0)
MCV: 96.5 fL (ref 80.0–100.0)
Monocytes Absolute: 0.5 10*3/uL (ref 0.1–1.0)
Monocytes Relative: 7 %
Neutro Abs: 3.7 10*3/uL (ref 1.7–7.7)
Neutrophils Relative %: 51 %
Platelets: 262 10*3/uL (ref 150–400)
RBC: 3.73 MIL/uL — ABNORMAL LOW (ref 3.87–5.11)
RDW: 12.8 % (ref 11.5–15.5)
WBC: 7.3 10*3/uL (ref 4.0–10.5)
nRBC: 0 % (ref 0.0–0.2)

## 2021-06-24 LAB — COMPREHENSIVE METABOLIC PANEL
ALT: 8 U/L (ref 0–44)
AST: 15 U/L (ref 15–41)
Albumin: 2.9 g/dL — ABNORMAL LOW (ref 3.5–5.0)
Alkaline Phosphatase: 55 U/L (ref 38–126)
Anion gap: 9 (ref 5–15)
BUN: 7 mg/dL — ABNORMAL LOW (ref 8–23)
CO2: 22 mmol/L (ref 22–32)
Calcium: 7.3 mg/dL — ABNORMAL LOW (ref 8.9–10.3)
Chloride: 105 mmol/L (ref 98–111)
Creatinine, Ser: 0.81 mg/dL (ref 0.44–1.00)
GFR, Estimated: >60 mL/min (ref >60)
Glucose, Bld: 84 mg/dL (ref 70–99)
Potassium: 3.7 mmol/L (ref 3.5–5.1)
Sodium: 136 mmol/L (ref 135–145)
Total Bilirubin: 0.6 mg/dL (ref 0.3–1.2)
Total Protein: 5.5 g/dL — ABNORMAL LOW (ref 6.5–8.1)

## 2021-06-24 LAB — MAGNESIUM: Magnesium: 1.5 mg/dL — ABNORMAL LOW (ref 1.7–2.4)

## 2021-06-24 LAB — URINE CULTURE

## 2021-06-24 LAB — C-REACTIVE PROTEIN: CRP: 1.6 mg/dL — ABNORMAL HIGH (ref <1.0)

## 2021-06-24 LAB — TSH: TSH: 0.124 u[IU]/mL — ABNORMAL LOW (ref 0.350–4.500)

## 2021-06-24 LAB — BRAIN NATRIURETIC PEPTIDE: B Natriuretic Peptide: 421.6 pg/mL — ABNORMAL HIGH (ref 0.0–100.0)

## 2021-06-24 LAB — PHOSPHORUS: Phosphorus: 2.5 mg/dL (ref 2.5–4.6)

## 2021-06-24 LAB — T3: T3, Total: 105 ng/dL (ref 71–180)

## 2021-06-24 MED ORDER — OXYCODONE HCL 10 MG PO TABS: 10.0000 mg | ORAL_TABLET | Freq: Two times a day (BID) | ORAL | 0 refills | Status: AC | PRN

## 2021-06-24 MED ORDER — MAGNESIUM SULFATE 2 GM/50ML IV SOLN
2.0000 g | Freq: Once | INTRAVENOUS | Status: AC
  Administered 2021-06-24: 2 g via INTRAVENOUS
  Filled 2021-06-24: qty 50

## 2021-06-24 NOTE — TOC Initial Note (Signed)
Transition of Care [facility]) - Initial/Assessment Note    Patient Details  Name: Denise King MRN: [id number] Date of Birth: 10-12-1951  Transition of [facility]) CM/SW Contact:    Zenon Mayo, RN Phone Number: 06/24/2021, 9:18 AM  Clinical Narrative:                 NCM spoke with patient at bedside, NCM offered choice for [facility], HHPT, she states she does not want [place] services.  She states she has all the DME at home that she needs. She has bp cuff and scale at home.  When asked if she consumes a low sodium diet, she states "I don't eat much".  She is for dc today.  She has no other needs.  Expected Discharge Plan: [place] Barriers to Discharge: No Barriers Identified   Patient Goals and CMS Choice Patient states their goals for this hospitalization and ongoing recovery are:: return home CMS Medicare.gov Compare Post Acute Care list provided to:: Patient Choice offered to / list presented to : Patient  Expected Discharge Plan and Services Expected Discharge Plan: [place] In-house Referral: NA Discharge Planning Services: CM Consult Post Acute Care Choice: NA Living arrangements for the past 2 months: Single Family Home Expected Discharge Date: 06/24/21                 DME Agency: NA       HH Arranged: Refused HH          Prior Living Arrangements/Services Living arrangements for the past 2 months: Single Family Home Lives with:: Adult Children, Spouse Patient language and need for interpreter reviewed:: Yes Do you feel safe going back to the place where you live?: Yes      Need for Family Participation in Patient Care: Yes (Comment) Care giver support system in place?: Yes (comment)   Criminal Activity/Legal Involvement Pertinent to Current Situation/Hospitalization: No - Comment as needed  Activities of Daily Living Home Assistive Devices/Equipment: None ADL Screening (condition at time of admission) Patient's  cognitive ability adequate to safely complete daily activities?: Yes Is the patient deaf or have difficulty hearing?: No Does the patient have difficulty seeing, even when wearing glasses/contacts?: No Does the patient have difficulty concentrating, remembering, or making decisions?: No Patient able to express need for assistance with ADLs?: Yes Does the patient have difficulty dressing or bathing?: No Independently performs ADLs?: Yes (appropriate for developmental age) Does the patient have difficulty walking or climbing stairs?: Yes Weakness of Legs: None Weakness of Arms/Hands: None  Permission Sought/Granted                  Emotional Assessment Appearance:: Appears stated age Attitude/Demeanor/Rapport: Engaged Affect (typically observed): Appropriate Orientation: : Oriented to Self, Oriented to  Time, Oriented to Place, Oriented to Situation   Psych Involvement: No (comment)  Admission diagnosis:  Acute on chronic respiratory failure ([place]) [J96.20] Patient Active Problem List   Diagnosis Date Noted   Acute on chronic respiratory failure ([place]) 06/22/2021   T8 vertebral fracture ([place]) 06/22/2021   Compression fracture of thoracic vertebra ([place]) 05/16/2021   Aortic atherosclerosis ([place]) 03/13/2021   Genital herpes simplex 03/12/2021   Hyperlipidemia 03/12/2021   Severe sepsis ([place]) 02/10/2021   Acute cystitis 02/10/2021   Left hip pain 36/14/4315   Acute metabolic encephalopathy 40/07/6760   Toe fracture, right 02/01/2021   Concussion 02/01/2021   Floaters, bilateral 01/31/2021   Acute on chronic respiratory failure  with hypoxia and hypercapnia ([place]) 07/10/2020   Acute on chronic heart failure ([place]) 07/04/2020   Choledocholithiasis 02/22/2020   Lymphocytic colitis 02/16/2020   Duodenal ulcer hemorrhagic    Malnutrition of moderate degree 01/01/2020   Acute respiratory failure with hypoxia and hypercapnia (H[facility]) 12/28/2019   Alcohol abuse 12/28/2019   Anxiety  12/28/2019   Acute on chronic heart failure with preserved ejection fraction ([place]) 12/28/2019   Abnormal MRI of abdomen    Cholelithiasis    Common bile duct stone 12/23/2019   Acute cholecystitis 12/23/2019   Polycythemia 09/11/2019   Right knee pain 08/09/2019   Quadriceps contusion 08/09/2019   Abnormal cardiac CT angiography 05/11/2019   Chest pain 04/11/2019   Acute viral syndrome 04/11/2019   Tobacco abuse 04/11/2019   Hyperphosphatemia 04/11/2019   SOB (shortness of breath) 04/07/2019   Osteoporosis 01/03/2019   Closed fracture of neck of femur ([place]) 10/17/2018   Hip fracture ([place]) 10/01/2018   Dysuria 05/20/2018   Right leg swelling 05/20/2018   CAP (community acquired pneumonia) 05/13/2018   Tachycardia 05/13/2018   Chest wall pain 05/13/2018   Narcotic poisoning ([place]) 05/13/2018   EtOH dependence ([place])    Polypharmacy    Esophagitis    Preventative health care 12/22/2017   Erythrocytosis 12/22/2017   Depression 12/22/2017   Hyperglycemia 12/22/2017   Right sided sciatica 12/22/2017   Cough 06/10/2017   Family history of colon cancer - brother and father 03/10/2016   Insomnia 02/11/2016   Generalized anxiety disorder 11/25/2015   Urinary frequency 04/25/2015   Nausea and vomiting in adult 11/08/2014   Spinal stenosis of lumbar region without neurogenic claudication 02/14/2014   Cigarette smoker 08/15/2013   Otitis media 10/27/2012   Abdominal pain 03/30/2012   HEPATIC CYST 02/18/2010   Chronic pain 02/08/2010   Vitamin B12 deficiency 10/04/2009   GERD 01/04/2009   ABDOMINAL PAIN-EPIGASTRIC 01/04/2009   Depressive disorder 08/10/2008   Venous (peripheral) insufficiency 08/10/2008   Irritable bowel syndrome 08/10/2008   Back pain 08/10/2008   CIGARETTE SMOKER 03/06/2008   Hx of adenomatous polyp of colon 12/06/2007   BRONCHITIS, RECURRENT 12/06/2007   DIVERTICULOSIS OF COLON 12/06/2007   Other forms of scoliosis, lumbosacral region 12/06/2007   CALCULUS,  KIDNEY 10/07/2007   VERTIGO 10/07/2007   Headache(784.0) 10/07/2007   PCP:  Biagio Borg, MD Pharmacy:   [place] [place] [address] Phone: [phone] Fax: [phone]     Social Determinants of Health (SDOH) Interventions    Readmission Risk Interventions Readmission Risk Prevention Plan 06/24/2021 07/10/2020  Transportation Screening Complete Complete  PCP or Specialist Appt within 3-5 Days Complete Complete  HRI or [place] - Complete  Social Work Consult for [place] Planning/Counseling Complete Complete  Palliative Care Screening Not Applicable Not Applicable  Medication Review (RN Care Manager) Complete -  Some recent data might be hidden

## 2021-06-24 NOTE — Consult Note (Signed)
   [facility] Inpatient Consult   06/24/2021  [name] August 29, 1951 [id number]  [place]  Accountable Care Organization [ACO] Patient: Denise King  Primary Care Provider:  [facility], Biagio Borg, MD, this provider office does the Transition of Care follow up   Patient screened for hospitalization and discussed in unit progression meeting with noted extreme high risk score for unplanned readmission risk . Patient was assess for potential Embedded Chronic Care Management service needs for post hospital transition.  Review of patient's medical record reveals patient is declining home health as recommended.  Met with patient at the bedside, HIPAA verified, regarding chronic care management services available at PCP office.  Patient can be followed for chronic care needs.   Plan:  Referral made for chronic care management follow up with Embedded CCM.    For questions contact:   Natividad Brood, RN BSN [facility] Liaison  [phone] business mobile phone Toll free office [phone]  Fax number: [phone] [email or web address]

## 2021-06-24 NOTE — TOC Transition Note (Signed)
Transition of Care [facility]) - CM/SW Discharge Note   Patient Details  Name: Denise King MRN: [id number] Date of Birth: 12/19/1951  Transition of Care [facility]) CM/SW Contact:  Zenon Mayo, RN Phone Number: 06/24/2021, 9:20 AM   Clinical Narrative:    NCM spoke with patient at bedside, NCM offered choice for [facility], HHPT, she states she does not want [facility] services.  She states she has all the DME at home that she needs. She has bp cuff and scale at home.  When asked if she consumes a low sodium diet, she states "I don't eat much".  She is for dc today.  She has no other needs.   Final next level of care: Home/Self Care Barriers to Discharge: No Barriers Identified   Patient Goals and CMS Choice Patient states their goals for this hospitalization and ongoing recovery are:: return home CMS Medicare.gov Compare Post Acute Care list provided to:: Patient Choice offered to / list presented to : Patient  Discharge Placement                       Discharge Plan and Services In-house Referral: NA Discharge Planning Services: CM Consult Post Acute Care Choice: NA            DME Agency: NA       HH Arranged: Refused HH          Social Determinants of Health (SDOH) Interventions     Readmission Risk Interventions Readmission Risk Prevention Plan 06/24/2021 07/10/2020  Transportation Screening Complete Complete  PCP or Specialist Appt within 3-5 Days Complete Complete  HRI or [place] - Complete  Social Work Consult for [place] Planning/Counseling Complete Complete  Palliative Care Screening Not Applicable Not Applicable  Medication Review Press photographer) Complete -  Some recent data might be hidden

## 2021-06-24 NOTE — Discharge Summary (Addendum)
Denise King OHY:[id number] DOB: 25-Nov-1951 DOA: 06/22/2021  PCP: Biagio Borg, MD  Admit date: 06/22/2021  Discharge date: 06/24/2021  Admitted From: Home  Disposition:  Home   Recommendations for Outpatient Follow-up:   Follow up with PCP in 1-2 weeks  PCP Please obtain BMP/CBC, 2 view CXR in 1week,  (see Discharge instructions)   PCP Please follow up on the following pending results: Please taper off narcotics rapidly, she is accidentally overdosing on narcotics and sedative medications.  Check CBC, CMP and TSH in 7 to 10 days   Home Health: PT, RN Equipment/Devices: TLSO brace, Walker  Consultations: None  Discharge Condition: Stable    CODE STATUS: Full    Diet Recommendation: Heart Healthy     Chief Complaint  Patient presents with   Shortness of Breath     Brief history of present illness from the day of admission and additional interim summary    Denise King is a 70 y.o. female with medical history significant of COPD on home oxygen 2 L as needed, chronic diastolic CHF with EF 94% on recent echocardiogram.  Patient is on very high doses of narcotics and sedative medications and has had multiple falls at home, she has chronic back pain, she came to the ER with chief complaints of shortness of breath and confusion.  Was found to have acute on chronic hypoxic and hypercapnic respiratory failure likely due to overuse of narcotics and admitted to the hospital.                                                                 Hospital Course     Acute Hypoxic & Hypercapnic Resp. Failure causing metabolic encephalopathy and multiple falls - she is on multiple sedative medications including high doses of narcotics and muscle relaxants, holding of offending medications and with supportive care her  mentation is back to her baseline, she and her husband have been extensively counseled to cut down narcotics and sedative medications on a urgent basis as this can cause life-threatening accidental overuse with possible death or disability, will request PCP also to kindly assist with this.  She is currently back to her baseline and wants to get her pain medications right away, counseled again will be discharged home with home PT and RN.  I have cut down her short-acting narcotic dose by one third..    2.  Multiple T-spine fractures 1 somewhat acute, no midline spinal step in deformity, no change in chronic back pain pattern, no paresthesias or focal weakness.  Supportive care.  Minimize narcotic and sedative use as above.  PT OT evaluation, rolling walker ordered Case discussed with neurosurgery Dr. Arnoldo Morale TLSO brace and follow-up with Dr. Dierdre Harness, she has already missed an appointment with him for similar problem that  occurred recently.  Of note patient is having multiple falls and back fractures kindly see #1 above.   3.  Chronic pain and anxiety.  On multiple narcotics and sedative medications.  Nightly see #1 above.   4.  COPD with ongoing smoking.  Counseled to quit smoking.  Supportive care.  Currently no wheezing or acute issues   5.  GERD.  On PPI.   6.  Dyslipidemia.  On statin.   7. HTN -stable after IV fluids continue home regimen.   8.  Chronic diastolic CHF EF 11% on recent echocardiogram.  Hydrated and stable continue home regimen.   9.  Narcotic bowel with stool burden.  Placed bowel regimen with improvement clinically.   Discharge diagnosis     Active Problems:   Chest wall pain   Polypharmacy   Tobacco abuse   Acute on chronic respiratory failure with hypoxia and hypercapnia (HCC)   Acute metabolic encephalopathy   Hyperlipidemia   Acute on chronic respiratory failure (HCC)   T8 vertebral fracture (HCC)    Discharge instructions    Discharge Instructions      Diet - low sodium heart healthy   Complete by: As directed    Discharge instructions   Complete by: As directed    Please cut your narcotic intake into half and follow-up with your family physician and pain clinic physician within a week to gradually taper it off.  Follow with Primary MD Biagio Borg, MD in 7 days   Get CBC, TSH, CMP, 2 view Chest X ray -  checked next visit within 1 week by Primary MD    Activity: As tolerated with Full fall precautions use walker/cane & assistance as needed, wear the TLSO brace when out of bed.  Disposition Home    Diet: Heart Healthy    Special Instructions: If you have smoked or chewed Tobacco  in the last 2 yrs please stop smoking, stop any regular Alcohol  and or any Recreational drug use.  On your next visit with your primary care physician please Get Medicines reviewed and adjusted.  Please request your Prim.MD to go over all Hospital Tests and Procedure/Radiological results at the follow up, please get all Hospital records sent to your Prim MD by signing hospital release before you go home.  If you experience worsening of your admission symptoms, develop shortness of breath, life threatening emergency, suicidal or homicidal thoughts you must seek medical attention immediately by calling 911 or calling your MD immediately  if symptoms less severe.  You Must read complete instructions/literature along with all the possible adverse reactions/side effects for all the Medicines you take and that have been prescribed to you. Take any new Medicines after you have completely understood and accpet all the possible adverse reactions/side effects.   Do not drive when taking Pain medications.  Do not take more than prescribed Pain, Sleep and Anxiety Medications   Increase activity slowly   Complete by: As directed        Discharge Medications   Allergies as of 06/24/2021       Reactions   Atorvastatin Nausea And Vomiting   Levofloxacin Other (See  Comments)   Achilles tendon pain   Omnicef [cefdinir] Diarrhea, Other (See Comments)   Uncontrollable diarrhea   Trazodone And Nefazodone Other (See Comments)   "Felt like I was going to faint"   Sulfa Antibiotics Rash   Sulfonamide Derivatives Rash        Medication List  TAKE these medications    albuterol 108 (90 Base) MCG/ACT inhaler Commonly known as: VENTOLIN HFA Inhale 1-2 puffs into the lungs every 6 (six) hours as needed for wheezing or shortness of breath.   budesonide 3 MG 24 hr capsule Commonly known as: ENTOCORT EC TAKE 3 CAPSULES ONCE DAILY. What changed: See the new instructions.   buPROPion 150 MG 12 hr tablet Commonly known as: WELLBUTRIN SR Take 1 tablet (150 mg total) by mouth daily.   cyclobenzaprine 5 MG tablet Commonly known as: FLEXERIL Take 1 tablet (5 mg total) by mouth 3 (three) times daily as needed for muscle spasms.   denosumab 60 MG/ML Sosy injection Commonly known as: PROLIA Inject 60 mg into the skin every 6 (six) months.   diclofenac Sodium 1 % Gel Commonly known as: VOLTAREN Apply 2 g topically 4 (four) times daily as needed (for joint pain).   DULoxetine 30 MG capsule Commonly known as: CYMBALTA Take 30 mg by mouth daily.   hyoscyamine 0.125 MG Tbdp disintergrating tablet Commonly known as: ANASPAZ Place 1 tablet (0.125 mg total) under the tongue every 6 (six) hours as needed ([name] cramps).   Melatonin 10 MG Tabs Take 10 mg by mouth at bedtime as needed (sleep).   morphine 15 MG 12 hr tablet Commonly known as: MS CONTIN Take 1 tablet (15 mg total) by mouth 3 (three) times daily.   naloxone 4 MG/0.1ML Liqd nasal spray kit Commonly known as: NARCAN Place 1 spray into the nose as needed (overdose).   nicotine 21 mg/24hr patch Commonly known as: NICODERM CQ - dosed in mg/24 hours Place 1 patch (21 mg total) onto the skin daily.   nitroGLYCERIN 0.4 MG SL tablet Commonly known as: NITROSTAT Place 1 tablet (0.4 mg total)  under the tongue every 5 (five) minutes as needed for chest pain.   ondansetron 4 MG disintegrating tablet Commonly known as: ZOFRAN-ODT DISSOLVE 1 TABLET ON TONGUE EVERY 8 HOURS AS NEEDED FOR NAUSEA/VOMITING. What changed:  how much to take how to take this when to take this reasons to take this additional instructions   Oxycodone HCl 10 MG Tabs Take 1 tablet (10 mg total) by mouth 2 (two) times daily as needed. What changed: when to take this   pantoprazole 40 MG tablet Commonly known as: PROTONIX TAKE (1) TABLET TWICE A DAY BEFORE MEALS. What changed:  how much to take how to take this when to take this additional instructions   polyethylene glycol 17 g packet Commonly known as: MiraLax Take 17 g by mouth 2 (two) times daily. What changed:  when to take this reasons to take this   potassium chloride 10 MEQ tablet Commonly known as: KLOR-CON Take 1 tablet (10 mEq total) by mouth daily.   rosuvastatin 5 MG tablet Commonly known as: CRESTOR Take 1 tablet (5 mg total) by mouth 3 (three) times a week. What changed: when to take this   sennosides-docusate sodium 8.6-50 MG tablet Commonly known as: SENOKOT-S Take 2 tablets by mouth 2 (two) times daily.   sertraline 100 MG tablet Commonly known as: ZOLOFT Take 2 tablets (200 mg total) by mouth at bedtime.   spironolactone 25 MG tablet Commonly known as: ALDACTONE Take 0.5 tablets (12.5 mg total) by mouth daily. What changed:  when to take this reasons to take this   sucralfate 1 g tablet Commonly known as: Carafate Take 1 tablet (1 g total) by mouth 4 (four) times daily -  with meals and  at bedtime for 14 days. What changed: when to take this   valACYclovir 500 MG tablet Commonly known as: VALTREX Take 500 mg by mouth 2 (two) times daily as needed (flare ups).               Durable Medical Equipment  (From admission, onward)           Start     Ordered   06/24/21 1101  For home use only DME  Walker rolling  Once       Comments: 5 wheel  Question Answer Comment  Walker: With 5 Inch Wheels   Patient needs a walker to treat with the following condition Weakness      06/24/21 1100             Follow-up Information     Erline Levine, MD. Schedule an appointment as soon as possible for a visit in 1 week(s).   Specialty: Neurosurgery Why: T spine fractures Contact information: [address] [place] [address] [phone]         Biagio Borg, MD. Daphane Shepherd on 06/26/2021.   Specialties: Internal Medicine, Radiology Why: _0 :00pm Contact information: [place] [address] [phone]         Josue Hector, MD .   Specialty: Cardiology Contact information: [phone] [address] [place] [address] [place] [address] [phone]                 Major procedures and Radiology Reports - PLEASE review detailed and final reports thoroughly  -        DG Ribs Unilateral W/Chest Left  Result Date: 06/22/2021 CLINICAL DATA:  Left rib pain and shortness of breath since fall 2 weeks ago. EXAM: LEFT RIBS AND CHEST - 3+ VIEW COMPARISON:  Chest x-ray May 24, 2021 FINDINGS: No rib fractures identified. No pneumothorax. Mild opacity in the lateral right lung base was not seen on the May 16, 2021 comparison. No other acute abnormalities. IMPRESSION: 1. No cause for left-sided pain identified. No rib lesions or fractures noted. 2. Mild opacity in the lateral right lung base may represent atelectasis or infiltrate. Recommend clinical correlation and follow-up imaging to ensure resolution. Electronically Signed   By: Dorise Bullion III M.D   On: 06/22/2021 15:59   CT Head Wo Contrast  Result Date: 06/22/2021 CLINICAL DATA:  Altered mental status. EXAM: CT HEAD WITHOUT CONTRAST TECHNIQUE: Contiguous axial images were obtained from the base of the skull through the vertex without intravenous contrast. COMPARISON:  February 11, 2021 FINDINGS:  Brain: There is mild cerebral atrophy with widening of the extra-axial spaces and ventricular dilatation. There are areas of decreased attenuation within the white matter tracts of the supratentorial brain, consistent with microvascular disease changes. Vascular: No hyperdense vessel or unexpected calcification. Skull: Normal. Negative for fracture or focal lesion. Sinuses/Orbits: No acute finding. Other: None. IMPRESSION: 1. Generalized cerebral atrophy. 2. No acute intracranial abnormality. Electronically Signed   By: Virgina Norfolk M.D.   On: 06/22/2021 17:00   CT Chest Wo Contrast  Result Date: 06/22/2021 CLINICAL DATA:  Chest trauma. EXAM: CT CHEST WITHOUT CONTRAST TECHNIQUE: Multidetector CT imaging of the chest was performed following the standard protocol without IV contrast. COMPARISON:  May 05, 2021 and August 09, 2020 FINDINGS: Cardiovascular: There is marked severity calcification of the aortic arch without evidence of aneurysmal dilatation. Normal heart size with marked severity coronary artery calcification. No pericardial effusion. Mediastinum/Nodes: No enlarged  mediastinal or axillary lymph nodes. Thyroid gland, trachea, and esophagus demonstrate no significant findings. Lungs/Pleura: A stable 4 mm noncalcified lung nodule is seen within the posterior aspect of the right apex. Mild areas of scarring and/or atelectasis are seen within the bilateral lung bases. There is no evidence of acute infiltrate, pleural effusion or pneumothorax. Upper Abdomen: There is a small hiatal hernia. Musculoskeletal: Chronic fracture deformities are seen involving the posterior eleventh right rib, sternum and T4 vertebral body. A compression fracture deformity of the T8 vertebral body is seen which represents a new finding when compared to the prior exam. Degenerative changes are seen throughout the thoracic spine IMPRESSION: 1. Multiple chronic fracture deformities, as described above, with a compression fracture  deformity of the T8 vertebral body which is new since the prior study. 2. Stable, likely benign right apical lung nodule. 3. Small hiatal hernia. Electronically Signed   By: Virgina Norfolk M.D.   On: 06/22/2021 17:13   CT Angio Chest Pulmonary Embolism (PE) W or WO Contrast  Result Date: 06/22/2021 CLINICAL DATA:  Left chest pain EXAM: CT ANGIOGRAPHY CHEST WITH CONTRAST TECHNIQUE: Multidetector CT imaging of the chest was performed using the standard protocol during bolus administration of intravenous contrast. Multiplanar CT image reconstructions and MIPs were obtained to evaluate the vascular anatomy. CONTRAST:  19mL OMNIPAQUE IOHEXOL 350 MG/ML SOLN COMPARISON:  06/22/2021 FINDINGS: Cardiovascular: No filling defects in the pulmonary arteries to suggest pulmonary emboli. Heart is normal size. Aorta is normal caliber. Coronary artery and aortic calcifications. Mediastinum/Nodes: No mediastinal, hilar, or axillary adenopathy. Trachea and esophagus are unremarkable. Thyroid unremarkable. Lungs/Pleura: Mild emphysema. Patchy opacity at the right lung base could reflect atelectasis or infiltrate/pneumonia. Linear atelectasis at the left base. No effusions. Upper Abdomen: Imaging into the upper abdomen demonstrates no acute findings. Musculoskeletal: Compression deformity at T8 and through the superior endplate of T6. Review of the MIP images confirms the above findings. IMPRESSION: No evidence of pulmonary embolus. Right lower lobe atelectasis or infiltrate. Compression fracture at T8 and the superior endplate of T6. Coronary artery disease. Aortic Atherosclerosis (ICD10-I70.0) and Emphysema (ICD10-J43.9). Electronically Signed   By: Rolm Baptise M.D.   On: 06/22/2021 22:51   DG Chest Port 1 View  Result Date: 06/23/2021 CLINICAL DATA:  Shortness of breath EXAM: PORTABLE CHEST 1 VIEW COMPARISON:  June 22, 2021 chest radiograph and chest CT FINDINGS: Persistent ill-defined opacity right base noted. Lungs  otherwise clear. Heart size and pulmonary vascularity are normal. No adenopathy. There is aortic atherosclerosis. No bone lesions. IMPRESSION: Apparent atelectasis with questionable superimposed pneumonia right base. Lungs otherwise clear. Heart size normal. Aortic Atherosclerosis (ICD10-I70.0). Electronically Signed   By: Lowella Grip III M.D.   On: 06/23/2021 08:15    Micro Results    Recent Results (from the past 240 hour(s))  Resp Panel by RT-PCR (Flu A&B, Covid) Nasopharyngeal Swab     Status: None   Collection Time: 06/22/21  3:33 PM   Specimen: Nasopharyngeal Swab; Nasopharyngeal(NP) swabs in vial transport medium  Result Value Ref Range Status   SARS Coronavirus 2 by RT PCR NEGATIVE NEGATIVE Final    Comment: (NOTE) SARS-CoV-2 target nucleic acids are NOT DETECTED.  The SARS-CoV-2 RNA is generally detectable in upper respiratory specimens during the acute phase of infection. The lowest concentration of SARS-CoV-2 viral copies this assay can detect is 138 copies/mL. A negative result does not preclude SARS-Cov-2 infection and should not be used as the sole basis for treatment or other patient management  decisions. A negative result may occur with  improper specimen collection/handling, submission of specimen other than nasopharyngeal swab, presence of viral mutation(s) within the areas targeted by this assay, and inadequate number of viral copies(<138 copies/mL). A negative result must be combined with clinical observations, patient history, and epidemiological information. The expected result is Negative.  Fact Sheet for Patients:  EntrepreneurPulse.com.au  Fact Sheet for Healthcare Providers:  IncredibleEmployment.be  This test is no t yet approved or cleared by the [place] FDA and  has been authorized for detection and/or diagnosis of SARS-CoV-2 by FDA under an Emergency Use Authorization (EUA). This EUA will remain  in effect  (meaning this test can be used) for the duration of the COVID-19 declaration under Section 564(b)(1) of the Act, 21 U.S.C.section 360bbb-3(b)(1), unless the authorization is terminated  or revoked sooner.       Influenza A by PCR NEGATIVE NEGATIVE Final   Influenza B by PCR NEGATIVE NEGATIVE Final    Comment: (NOTE) The Xpert Xpress SARS-CoV-2/FLU/RSV plus assay is intended as an aid in the diagnosis of influenza from Nasopharyngeal swab specimens and should not be used as a sole basis for treatment. Nasal washings and aspirates are unacceptable for Xpert Xpress SARS-CoV-2/FLU/RSV testing.  Fact Sheet for Patients: EntrepreneurPulse.com.au  Fact Sheet for Healthcare Providers: IncredibleEmployment.be  This test is not yet approved or cleared by the [place] FDA and has been authorized for detection and/or diagnosis of SARS-CoV-2 by FDA under an Emergency Use Authorization (EUA). This EUA will remain in effect (meaning this test can be used) for the duration of the COVID-19 declaration under Section 564(b)(1) of the Act, 21 U.S.C. section 360bbb-3(b)(1), unless the authorization is terminated or revoked.  Performed at [facility], [place] [address], [place], [place] [address]   Urine culture     Status: Abnormal   Collection Time: 06/22/21  6:58 PM   Specimen: Urine, Random  Result Value Ref Range Status   Specimen Description URINE, RANDOM  Final   Special Requests   Final    NONE Performed at [facility], [place] [address], [place], [place] [address]    Culture MULTIPLE SPECIES PRESENT, SUGGEST RECOLLECTION (A)  Final   Report Status 06/24/2021 FINAL  Final    Today   Subjective    [facility] today has no headache,no chest abdominal pain,no new weakness tingling or numbness, feels much better wants to go home today.     Objective   Blood pressure 137/77, pulse 93, temperature 98 F (36.7 C), temperature  source Oral, resp. rate 20, height _0  (1.6 m), weight 50.9 kg, SpO2 96 %.   Intake/Output Summary (Last 24 hours) at 06/24/2021 1111 Last data filed at 06/24/2021 0500 Gross per 24 hour  Intake 720 ml  Output --  Net 720 ml    Exam  Awake Alert, No new F.N deficits, Anxious affect [place].AT,PERRAL Supple Neck,No JVD, No cervical lymphadenopathy appriciated.  Symmetrical Chest wall movement, Good air movement bilaterally, CTAB RRR,No Gallops,Rubs or new Murmurs, No Parasternal Heave +ve B.Sounds, Abd Soft, Non tender, No organomegaly appriciated, No rebound -guarding or rigidity. No Cyanosis, Clubbing or edema, No new Rash or bruise   Data Review   CBC w Diff:  Lab Results  Component Value Date   WBC 7.3 06/24/2021   HGB 11.5 (L) 06/24/2021   HGB 15.9 05/09/2019   HCT 36.0 06/24/2021   HCT 46.9 (H) 05/09/2019   PLT 262 06/24/2021   PLT  245 05/09/2019   LYMPHOPCT 40 06/24/2021   MONOPCT 7 06/24/2021   EOSPCT 2 06/24/2021   BASOPCT 0 06/24/2021    CMP:  Lab Results  Component Value Date   NA 136 06/24/2021   NA 139 05/16/2020   K 3.7 06/24/2021   CL 105 06/24/2021   CO2 22 06/24/2021   BUN 7 (L) 06/24/2021   BUN 8 05/16/2020   CREATININE 0.81 06/24/2021   CREATININE 0.84 07/25/2020   PROT 5.5 (L) 06/24/2021   PROT 7.0 05/16/2020   ALBUMIN 2.9 (L) 06/24/2021   ALBUMIN 4.4 05/16/2020   BILITOT 0.6 06/24/2021   BILITOT 0.4 05/16/2020   ALKPHOS 55 06/24/2021   AST 15 06/24/2021   ALT 8 06/24/2021  .   Total Time in preparing paper work, data evaluation and todays exam - 57 minutes  Lala Lund M.D on 06/24/2021 at 11:11 AM  [facility]

## 2021-06-24 NOTE — Discharge Instructions (Addendum)
Please cut your narcotic intake into half and follow-up with your family physician and pain clinic physician within a week to gradually taper it off.  Follow with Primary MD Biagio Borg, MD in 7 days   Get CBC, TSH, CMP, 2 view Chest X ray -  checked next visit within 1 week by Primary MD    Activity: As tolerated with Full fall precautions use walker/cane & assistance as needed, wear the TLSO brace when out of bed.  Disposition Home    Diet: Heart Healthy    Special Instructions: If you have smoked or chewed Tobacco  in the last 2 yrs please stop smoking, stop any regular Alcohol  and or any Recreational drug use.  On your next visit with your primary care physician please Get Medicines reviewed and adjusted.  Please request your Prim.MD to go over all Hospital Tests and Procedure/Radiological results at the follow up, please get all Hospital records sent to your Prim MD by signing hospital release before you go home.  If you experience worsening of your admission symptoms, develop shortness of breath, life threatening emergency, suicidal or homicidal thoughts you must seek medical attention immediately by calling 911 or calling your MD immediately  if symptoms less severe.  You Must read complete instructions/literature along with all the possible adverse reactions/side effects for all the Medicines you take and that have been prescribed to you. Take any new Medicines after you have completely understood and accpet all the possible adverse reactions/side effects.   Do not drive when taking Pain medications.  Do not take more than prescribed Pain, Sleep and Anxiety Medications

## 2021-06-25 ENCOUNTER — Telehealth: Payer: Self-pay | Admitting: *Deleted

## 2021-06-25 NOTE — Chronic Care Management (AMB) (Signed)
  Chronic Care Management   Note  06/25/2021 Name: TACHE BOBST MRN: [id number] DOB: 1951-01-15  LILE MCCURLEY is a 70 y.o. year old female who is a primary care patient of Biagio Borg, MD. I reached out to Karie Mainland by phone today in response to a referral sent by Ms. Dionne Ano Kinkead's PCP Biagio Borg, MD     Ms. Heuer was given information about Chronic Care Management services today including:  CCM service includes personalized support from designated clinical staff supervised by her physician, including individualized plan of care and coordination with other care providers 24/7 contact phone numbers for assistance for urgent and routine care needs. Service will only be billed when office clinical staff spend 20 minutes or more in a month to coordinate care. Only one practitioner may furnish and bill the service in a calendar month. The patient may stop CCM services at any time (effective at the end of the month) by phone call to the office staff. The patient will be responsible for cost sharing (co-pay) of up to 20% of the service fee (after annual deductible is met).  Patient agreed to services and verbal consent obtained.   Follow up plan: Telephone appointment with care management team member scheduled for:07/04/2021  Julian Hy, [facility]  Direct Dial: [phone]

## 2021-06-26 ENCOUNTER — Other Ambulatory Visit: Payer: Self-pay

## 2021-06-26 ENCOUNTER — Encounter: Payer: Self-pay | Admitting: Internal Medicine

## 2021-06-26 ENCOUNTER — Ambulatory Visit (INDEPENDENT_AMBULATORY_CARE_PROVIDER_SITE_OTHER): Payer: Medicare HMO | Admitting: Internal Medicine

## 2021-06-26 ENCOUNTER — Ambulatory Visit (INDEPENDENT_AMBULATORY_CARE_PROVIDER_SITE_OTHER): Payer: Medicare HMO

## 2021-06-26 VITALS — BP 132/80 | HR 90 | Temp 98.5°F | Resp 18 | Ht 63.0 in | Wt 111.2 lb

## 2021-06-26 DIAGNOSIS — R0600 Dyspnea, unspecified: Secondary | ICD-10-CM

## 2021-06-26 DIAGNOSIS — R5383 Other fatigue: Secondary | ICD-10-CM | POA: Diagnosis not present

## 2021-06-26 DIAGNOSIS — Z0001 Encounter for general adult medical examination with abnormal findings: Secondary | ICD-10-CM | POA: Diagnosis not present

## 2021-06-26 DIAGNOSIS — Z0000 Encounter for general adult medical examination without abnormal findings: Secondary | ICD-10-CM

## 2021-06-26 DIAGNOSIS — I700 Atherosclerosis of aorta: Secondary | ICD-10-CM

## 2021-06-26 DIAGNOSIS — R739 Hyperglycemia, unspecified: Secondary | ICD-10-CM | POA: Diagnosis not present

## 2021-06-26 DIAGNOSIS — E7800 Pure hypercholesterolemia, unspecified: Secondary | ICD-10-CM | POA: Diagnosis not present

## 2021-06-26 DIAGNOSIS — J9611 Chronic respiratory failure with hypoxia: Secondary | ICD-10-CM

## 2021-06-26 DIAGNOSIS — S22040D Wedge compression fracture of fourth thoracic vertebra, subsequent encounter for fracture with routine healing: Secondary | ICD-10-CM | POA: Diagnosis not present

## 2021-06-26 DIAGNOSIS — G8929 Other chronic pain: Secondary | ICD-10-CM | POA: Diagnosis not present

## 2021-06-26 DIAGNOSIS — F17210 Nicotine dependence, cigarettes, uncomplicated: Secondary | ICD-10-CM

## 2021-06-26 DIAGNOSIS — Z79899 Other long term (current) drug therapy: Secondary | ICD-10-CM

## 2021-06-26 DIAGNOSIS — E538 Deficiency of other specified B group vitamins: Secondary | ICD-10-CM | POA: Diagnosis not present

## 2021-06-26 DIAGNOSIS — J449 Chronic obstructive pulmonary disease, unspecified: Secondary | ICD-10-CM | POA: Diagnosis not present

## 2021-06-26 DIAGNOSIS — R69 Illness, unspecified: Secondary | ICD-10-CM | POA: Diagnosis not present

## 2021-06-26 DIAGNOSIS — E559 Vitamin D deficiency, unspecified: Secondary | ICD-10-CM | POA: Diagnosis not present

## 2021-06-26 DIAGNOSIS — R0602 Shortness of breath: Secondary | ICD-10-CM | POA: Diagnosis not present

## 2021-06-26 LAB — VITAMIN B12: Vitamin B-12: 304 pg/mL (ref 211–911)

## 2021-06-26 LAB — LIPID PANEL
Cholesterol: 174 mg/dL (ref 0–200)
HDL: 73.1 mg/dL (ref >39.00)
LDL Cholesterol: 81 mg/dL (ref 0–99)
NonHDL: 100.54
Total CHOL/HDL Ratio: 2
Triglycerides: 96 mg/dL (ref 0.0–149.0)
VLDL: 19.2 mg/dL (ref 0.0–40.0)

## 2021-06-26 LAB — CBC WITH DIFFERENTIAL/PLATELET
Basophils Absolute: 0 10*3/uL (ref 0.0–0.1)
Basophils Relative: 0.6 % (ref 0.0–3.0)
Eosinophils Absolute: 0.1 10*3/uL (ref 0.0–0.7)
Eosinophils Relative: 1.7 % (ref 0.0–5.0)
HCT: 39 % (ref 36.0–46.0)
Hemoglobin: 13 g/dL (ref 12.0–15.0)
Lymphocytes Relative: 39.1 % (ref 12.0–46.0)
Lymphs Abs: 2.7 10*3/uL (ref 0.7–4.0)
MCHC: 33.2 g/dL (ref 30.0–36.0)
MCV: 93.3 fl (ref 78.0–100.0)
Monocytes Absolute: 0.7 10*3/uL (ref 0.1–1.0)
Monocytes Relative: 10.3 % (ref 3.0–12.0)
Neutro Abs: 3.4 10*3/uL (ref 1.4–7.7)
Neutrophils Relative %: 48.3 % (ref 43.0–77.0)
Platelets: 322 10*3/uL (ref 150.0–400.0)
RBC: 4.19 Mil/uL (ref 3.87–5.11)
RDW: 14 % (ref 11.5–15.5)
WBC: 7 10*3/uL (ref 4.0–10.5)

## 2021-06-26 LAB — BASIC METABOLIC PANEL
BUN: 6 mg/dL (ref 6–23)
CO2: 29 mEq/L (ref 19–32)
Calcium: 8.2 mg/dL — ABNORMAL LOW (ref 8.4–10.5)
Chloride: 104 mEq/L (ref 96–112)
Creatinine, Ser: 0.72 mg/dL (ref 0.40–1.20)
GFR: 86.11 mL/min (ref >60.00)
Glucose, Bld: 88 mg/dL (ref 70–99)
Potassium: 4.4 mEq/L (ref 3.5–5.1)
Sodium: 140 mEq/L (ref 135–145)

## 2021-06-26 LAB — VITAMIN D 25 HYDROXY (VIT D DEFICIENCY, FRACTURES): VITD: 40.01 ng/mL (ref 30.00–100.00)

## 2021-06-26 LAB — TSH: TSH: 1.05 u[IU]/mL (ref 0.35–5.50)

## 2021-06-26 LAB — HEPATIC FUNCTION PANEL
ALT: 8 U/L (ref 0–35)
AST: 18 U/L (ref 0–37)
Albumin: 3.9 g/dL (ref 3.5–5.2)
Alkaline Phosphatase: 66 U/L (ref 39–117)
Bilirubin, Direct: 0.1 mg/dL (ref 0.0–0.3)
Total Bilirubin: 0.3 mg/dL (ref 0.2–1.2)
Total Protein: 6.8 g/dL (ref 6.0–8.3)

## 2021-06-26 MED ORDER — ONDANSETRON 4 MG PO TBDP: ORAL_TABLET | ORAL | 3 refills | Status: DC

## 2021-06-26 NOTE — Patient Instructions (Addendum)
Please continue all other medications as before, and refills have been done if requested.  Please have the pharmacy call with any other refills you may need.  Please continue your efforts at being more active, low cholesterol diet, and weight control.  You are otherwise up to date with prevention measures today.  Please keep your appointments with your specialists as you may have planned  Please go to the XRAY Department in the first floor for the x-ray testing  Please go to the LAB at the blood drawing area for the tests to be done  You will be contacted by phone if any changes need to be made immediately.  Otherwise, you will receive a letter about your results with an explanation, but please check with MyChart first.  Please remember to sign up for MyChart if you have not done so, as this will be important to you in the future with finding out test results, communicating by private email, and scheduling acute appointments online when needed.

## 2021-06-26 NOTE — Progress Notes (Signed)
Patient ID: Denise King, female   DOB: [birth year], 70 y.o.   MRN: [id number]         Chief Complaint:: wellness exam and Hospital F/U (She states that she still hurts a lot. Pain scale at present 7/10 at present. Patient has a back brace with her but is not currently wearing it. )  And dyspnea, fatigue, polypharmacy       HPI:  Denise King is a 70 y.o. female here for wellness exam; declines covid booster, shingrix and pneumovax for now, o/w up to date with preventive referrals and immunizations.                        Also recently hospd 6/26-6/28 with polypharmacy and sedation related to narcotic and sedatives, seeming to result in several multiple recent falls, and most recently consfusion with acute on chronic hypoxic respiratory failure on home O2 2L. Marland Kitchen  Pt continues to have recurring LBP without change in severity, bowel or bladder change, fever, wt loss,  worsening LE pain/numbness/weakness, gait change or falls, and continues to follow with pain management.  Was d/c on less short acting narcotic by 1/3.  Pt has multiple T spine fractures with 1 possibly subacute at T8  Was recommended for Supportive care.  Minimize narcotic and sedative use as above.  PT OT evaluation, rolling walker ordered Case discussed with neurosurgery Dr. Arnoldo Morale TLSO brace and follow-up with Dr. Dierdre Harness.  COPD felt to be doing relatively well, controlled GERD, hld on statin, HTN management adequate, diastolic CHF controlled and narcotic bowel addressed with bowel regimen. Does c/o ongoing fatigue, but denies signficant daytime hypersomnolence.  Due today for f/u lab and xray.     Wt Readings from Last 3 Encounters:  06/26/21 111 lb 3.2 oz (50.4 kg)  06/24/21 112 lb 4.8 oz (50.9 kg)  06/04/21 112 lb (50.8 kg)   BP Readings from Last 3 Encounters:  06/26/21 132/80  06/24/21 137/77  06/22/21 120/77   Immunization History  Administered Date(s) Administered   Fluad Quad(high Dose 65+) 09/11/2019    H1N1 12/11/2008   Influenza Split 11/13/2011, 09/30/2012   Influenza Whole 10/02/2009, 09/27/2010   Influenza, High Dose Seasonal PF 11/13/2016, 10/05/2018   Influenza,inj,Quad PF,6+ Mos 11/16/2013, 11/08/2014, 10/10/2015, 10/20/2016, 10/22/2017   PFIZER(Purple Top)SARS-COV-2 Vaccination 02/29/2020, 03/27/2020, 11/26/2020   Tdap 12/22/2013  There are no preventive care reminders to display for this patient.    Past Medical History:  Diagnosis Date   Acute cystitis    Acute respiratory failure ([place])    Allergy    as a child grew out of them   Anemia    pernicious anemia   Anxiety    Arthritis    Back pain    Blood transfusion    CAP (community acquired pneumonia)    CHF (congestive heart failure) (HCC)    Chronic pain syndrome    Common bile duct stone    Depression    Diverticulosis of colon    Duodenal ulcer hemorrhagic-after biliary sphincterotomy    Dysthymia    Esophagitis    EtOH dependence ([place])    Gastritis    GERD (gastroesophageal reflux disease)    H/O chest pain Dec. 2013   no work up done   Hx of colonic polyps    Hypertension    Irritable bowel syndrome    Lymphocytic colitis 02/16/2020   Osteopenia    Osteoporosis    Pneumonia 2019  Polypharmacy    Reflux esophagitis    Right sided sciatica 12/22/2017   Tobacco use disorder    Unspecified chronic bronchitis (HCC)    Vertigo    Vitamin B12 deficiency    Wears glasses    Past Surgical History:  Procedure Laterality Date   APPENDECTOMY     BACK SURGERY  10-09   Dr. Patrice Paradise   BIOPSY  02/14/2020   Procedure: BIOPSY;  Surgeon: Gatha Mayer, MD;  Location: [facility];  Service: Endoscopy;;   CARPAL TUNNEL RELEASE Right 08/14/2014   Procedure: RIGHT CARPAL TUNNEL RELEASE AND INJECT LEFT THUMB;  Surgeon: Daryll Brod, MD;  Location: [place];  Service: Orthopedics;  Laterality: Right;   CHOLECYSTECTOMY N/A 02/22/2020   Procedure: LAPAROSCOPIC CHOLECYSTECTOMY WITH INTRAOPERATIVE  CHOLANGIOGRAM;  Surgeon: Kieth Brightly Arta Bruce, MD;  Location: [facility];  Service: General;  Laterality: N/A;   COLONOSCOPY     COLONOSCOPY WITH PROPOFOL N/A 02/14/2020   Procedure: COLONOSCOPY WITH PROPOFOL;  Surgeon: Gatha Mayer, MD;  Location: [facility];  Service: Endoscopy;  Laterality: N/A;   ENDOSCOPIC RETROGRADE CHOLANGIOPANCREATOGRAPHY (ERCP) WITH PROPOFOL N/A 12/25/2019   Procedure: ENDOSCOPIC RETROGRADE CHOLANGIOPANCREATOGRAPHY (ERCP) WITH PROPOFOL;  Surgeon: Gatha Mayer, MD;  Location: [place];  Service: Gastroenterology;  Laterality: N/A;   ESOPHAGOGASTRODUODENOSCOPY (EGD) WITH PROPOFOL N/A 01/06/2020   Procedure: ESOPHAGOGASTRODUODENOSCOPY (EGD) WITH PROPOFOL;  Surgeon: Irene Shipper, MD;  Location: [facility] ENDOSCOPY;  Service: Endoscopy;  Laterality: N/A;   HOT HEMOSTASIS N/A 01/06/2020   Procedure: HOT HEMOSTASIS (ARGON PLASMA COAGULATION/BICAP);  Surgeon: Irene Shipper, MD;  Location: [facility] ENDOSCOPY;  Service: Endoscopy;  Laterality: N/A;   INTRAMEDULLARY (IM) NAIL INTERTROCHANTERIC Right 10/01/2018   Procedure: INTRAMEDULLARY (IM) NAIL INTERTROCHANTRIC;  Surgeon: Thornton Park, MD;  Location: [facility] ORS;  Service: Orthopedics;  Laterality: Right;   LAPAROSCOPY N/A 02/21/2013   Procedure: LAPAROSCOPY OPERATIVE;  Surgeon: Margarette Asal, MD;  Location: [place] [facility];  Service: Gynecology;  Laterality: N/A;  REQUESTING 5MM SCOPE WITH CAMERA   LEFT HEART CATH AND CORONARY ANGIOGRAPHY N/A 05/11/2019   Procedure: LEFT HEART CATH AND CORONARY ANGIOGRAPHY;  Surgeon: Sherren Mocha, MD;  Location: [place] CV LAB;  Service: Cardiovascular;  Laterality: N/A;   REMOVAL OF STONES  12/25/2019   Procedure: Karlyn Agee;  Surgeon: Gatha Mayer, MD;  Location: [facility] ENDOSCOPY;  Service: Gastroenterology;;   Denise King  01/06/2020   Procedure: Denise King;  Surgeon: Irene Shipper, MD;  Location: [facility] ENDOSCOPY;  Service: Endoscopy;;   SPHINCTEROTOMY  12/25/2019   Procedure: Joan Mayans;   Surgeon: Gatha Mayer, MD;  Location: [place];  Service: Gastroenterology;;   TONSILLECTOMY     TUBAL LIGATION     UPPER GASTROINTESTINAL ENDOSCOPY      reports that she has been smoking cigarettes. She has a 30.00 pack-year smoking history. She has never used smokeless tobacco. She reports current alcohol use of about 3.0 standard drinks of alcohol per week. She reports that she does not use drugs. family history includes Alcohol abuse in her daughter; Bipolar disorder in her daughter; Colon cancer in her brother and father; Drug abuse in her daughter; Heart disease in her mother; Prostate cancer in her father; Skin cancer in her sister. Allergies  Allergen Reactions   Atorvastatin Nausea And Vomiting   Levofloxacin Other (See Comments)    Achilles tendon pain   Omnicef [Cefdinir] Diarrhea and Other (See Comments)    Uncontrollable diarrhea   Trazodone And Nefazodone Other (See Comments)    "  Felt like I was going to faint"   Sulfa Antibiotics Rash   Sulfonamide Derivatives Rash   Current Outpatient Medications on File Prior to Visit  Medication Sig Dispense Refill   albuterol (VENTOLIN HFA) 108 (90 Base) MCG/ACT inhaler Inhale 1-2 puffs into the lungs every 6 (six) hours as needed for wheezing or shortness of breath.     budesonide (ENTOCORT EC) 3 MG 24 hr capsule TAKE 3 CAPSULES ONCE DAILY. 90 capsule 1   buPROPion (WELLBUTRIN SR) 150 MG 12 hr tablet Take 1 tablet (150 mg total) by mouth daily. 90 tablet 1   cyclobenzaprine (FLEXERIL) 5 MG tablet Take 1 tablet (5 mg total) by mouth 3 (three) times daily as needed for muscle spasms. 60 tablet 2   denosumab (PROLIA) 60 MG/ML SOSY injection Inject 60 mg into the skin every 6 (six) months.     diclofenac Sodium (VOLTAREN) 1 % GEL Apply 2 g topically 4 (four) times daily as needed (for joint pain).     DULoxetine (CYMBALTA) 30 MG capsule Take 30 mg by mouth daily.     hyoscyamine (ANASPAZ) 0.125 MG TBDP disintergrating tablet Place 1  tablet (0.125 mg total) under the tongue every 6 (six) hours as needed ([name] cramps). 90 tablet 3   Melatonin 10 MG TABS Take 10 mg by mouth at bedtime as needed (sleep).     morphine (MS CONTIN) 15 MG 12 hr tablet Take 1 tablet (15 mg total) by mouth 3 (three) times daily. 30 tablet 0   naloxone (NARCAN) nasal spray 4 mg/0.1 mL Place 1 spray into the nose as needed (overdose).     nicotine (NICODERM CQ - DOSED IN MG/24 HOURS) 21 mg/24hr patch Place 1 patch (21 mg total) onto the skin daily. 28 patch 0   nitroGLYCERIN (NITROSTAT) 0.4 MG SL tablet Place 1 tablet (0.4 mg total) under the tongue every 5 (five) minutes as needed for chest pain. 25 tablet 3   Oxycodone HCl 10 MG TABS Take 1 tablet (10 mg total) by mouth 2 (two) times daily as needed. 1 tablet 0   pantoprazole (PROTONIX) 40 MG tablet TAKE (1) TABLET TWICE A DAY BEFORE MEALS. 60 tablet 11   polyethylene glycol (MIRALAX) 17 g packet Take 17 g by mouth 2 (two) times daily. 14 each 0   rosuvastatin (CRESTOR) 5 MG tablet Take 1 tablet (5 mg total) by mouth 3 (three) times a week. 39 tablet 3   sennosides-docusate sodium (SENOKOT-S) 8.6-50 MG tablet Take 2 tablets by mouth 2 (two) times daily.     sertraline (ZOLOFT) 100 MG tablet Take 2 tablets (200 mg total) by mouth at bedtime. 180 tablet 1   spironolactone (ALDACTONE) 25 MG tablet Take 0.5 tablets (12.5 mg total) by mouth daily. 45 tablet 3   valACYclovir (VALTREX) 500 MG tablet Take 500 mg by mouth 2 (two) times daily as needed (flare ups).     potassium chloride (KLOR-CON) 10 MEQ tablet Take 1 tablet (10 mEq total) by mouth daily. 30 tablet 0   sucralfate (CARAFATE) 1 g tablet Take 1 tablet (1 g total) by mouth 4 (four) times daily -  with meals and at bedtime for 14 days. (Patient taking differently: Take 1 g by mouth in the morning and at bedtime.) 56 tablet 0   No current facility-administered medications on file prior to visit.        ROS:  All others reviewed and  negative.  Objective  PE:  BP 132/80   Pulse 90   Temp 98.5 F (36.9 C) (Oral)   Resp 18   Ht 5\' 3"  (1.6 m)   Wt 111 lb 3.2 oz (50.4 kg)   SpO2 94%   BMI 19.70 kg/m                 Constitutional: Pt appears in NAD               HENT: Head: NCAT.                Right Ear: External ear normal.                 Left Ear: External ear normal.                Eyes: . Pupils are equal, round, and reactive to light. Conjunctivae and EOM are normal               Nose: without d/c or deformity               Neck: Neck supple. Gross normal ROM               Cardiovascular: Normal rate and regular rhythm.                 Pulmonary/Chest: Effort normal and breath sounds without rales or wheezing.                Abd:  Soft, NT, ND, + BS, no organomegaly               Neurological: Pt is alert. At baseline orientation, motor grossly intact               Skin: Skin is warm. No rashes, no other new lesions, LE edema - none               Psychiatric: Pt behavior is normal without agitation   Micro: none  Cardiac tracings I have personally interpreted today:  none  Pertinent Radiological findings (summarize): none   Lab Results  Component Value Date   WBC 7.0 06/26/2021   HGB 13.0 06/26/2021   HCT 39.0 06/26/2021   PLT 322.0 06/26/2021   GLUCOSE 88 06/26/2021   CHOL 174 06/26/2021   TRIG 96.0 06/26/2021   HDL 73.10 06/26/2021   LDLDIRECT 102.4 05/24/2008   LDLCALC 81 06/26/2021   ALT 8 06/26/2021   AST 18 06/26/2021   NA 140 06/26/2021   K 4.4 06/26/2021   CL 104 06/26/2021   CREATININE 0.72 06/26/2021   BUN 6 06/26/2021   CO2 29 06/26/2021   TSH 1.05 06/26/2021   INR 1.0 02/09/2021   HGBA1C 5.2 10/01/2018   Assessment/Plan:  SHERYN ALDAZ is a 70 y.o. White or Caucasian [1] female with  has a past medical history of Acute cystitis, Acute respiratory failure ([place]), Allergy, Anemia, Anxiety, Arthritis, Back pain, Blood transfusion, CAP (community acquired  pneumonia), CHF (congestive heart failure) ([place]), Chronic pain syndrome, Common bile duct stone, Depression, Diverticulosis of colon, Duodenal ulcer hemorrhagic-after biliary sphincterotomy, Dysthymia, Esophagitis, EtOH dependence ([place]), Gastritis, GERD (gastroesophageal reflux disease), H/O chest pain (Dec. 2013), colonic polyps, Hypertension, Irritable bowel syndrome, Lymphocytic colitis (02/16/2020), Osteopenia, Osteoporosis, Pneumonia (2019), Polypharmacy, Reflux esophagitis, Right sided sciatica (12/22/2017), Tobacco use disorder, Unspecified chronic bronchitis ([place]), Vertigo, Vitamin B12 deficiency, and Wears glasses.  Aortic atherosclerosis (HCC) Pt to continue low chol diet, exercise, current crestor 5  Compression fracture of thoracic vertebra (HCC) Pt not wearing her brace today; encouraged to do so, f/u PT and Dr Vertell Limber  Polypharmacy Agree with close monitoring of sedating meds  Chronic pain Agree with current pain management, and pt to f/u with pain management as planned  Hyperglycemia Lab Results  Component Value Date   HGBA1C 5.2 10/01/2018   Stable, pt to continue current medical treatment  - diet   Fatigue Also for labs today as ordered including cbc  Encounter for well adult exam with abnormal findings Age and sex appropriate education and counseling updated with regular exercise and diet Referrals for preventative services - none needed Immunizations addressed - declines covid booster, shingrix, pneumovax Smoking counseling  - counseled to qut, pt not ready Evidence for depression or other mood disorder - chronic anxiety being treated Most recent labs reviewed. I have personally reviewed and have noted: 1) the patient's medical and social history 2) The patient's current medications and supplements 3) The patient's height, weight, and BMI have been recorded in the chart   Vitamin B12 deficiency Lab Results  Component Value Date   VITAMINB12 304  06/26/2021   Stable, cont oral replacement - b12 1000 mcg qd   Cigarette smoker counsled to quit pt not ready  Hyperlipidemia Lab Results  Component Value Date   LDLCALC 81 06/26/2021   Stable, pt to continue current statin crestor 5   COPD (chronic obstructive pulmonary disease) ([place]) Also for f/u cxr given ongoing dyspnea and recent acute on chronic resp failure  Vitamin D deficiency Last vitamin D Lab Results  Component Value Date   VD25OH 40.01 06/26/2021   Stable, cont oral replacement   Chronic hypoxemic respiratory failure ([place]) Cont on home o2 2L, follow with pulmonary  Followup: Return in about 3 months (around 09/26/2021).  Cathlean Cower, MD 06/30/2021 11:30 AM [facility]

## 2021-06-28 ENCOUNTER — Encounter: Payer: Self-pay | Admitting: Internal Medicine

## 2021-06-30 ENCOUNTER — Encounter: Payer: Self-pay | Admitting: Internal Medicine

## 2021-06-30 DIAGNOSIS — J9611 Chronic respiratory failure with hypoxia: Secondary | ICD-10-CM | POA: Insufficient documentation

## 2021-06-30 DIAGNOSIS — E559 Vitamin D deficiency, unspecified: Secondary | ICD-10-CM | POA: Insufficient documentation

## 2021-06-30 DIAGNOSIS — J449 Chronic obstructive pulmonary disease, unspecified: Secondary | ICD-10-CM | POA: Insufficient documentation

## 2021-06-30 NOTE — Assessment & Plan Note (Signed)
Cont on home o2 2L, follow with pulmonary

## 2021-06-30 NOTE — Assessment & Plan Note (Signed)
counsled to quit pt not ready

## 2021-06-30 NOTE — Assessment & Plan Note (Signed)
Pt to continue low chol diet, exercise, current crestor 5

## 2021-06-30 NOTE — Assessment & Plan Note (Signed)
Also for f/u cxr given ongoing dyspnea and recent acute on chronic resp failure

## 2021-06-30 NOTE — Assessment & Plan Note (Signed)
Lab Results  Component Value Date   LDLCALC 81 06/26/2021   Stable, pt to continue current statin crestor 5

## 2021-06-30 NOTE — Assessment & Plan Note (Signed)
Lab Results  Component Value Date   VITAMINB12 304 06/26/2021   Stable, cont oral replacement - b12 1000 mcg qd

## 2021-06-30 NOTE — Assessment & Plan Note (Signed)
Agree with close monitoring of sedating meds

## 2021-06-30 NOTE — Assessment & Plan Note (Signed)
Also for labs today as ordered including cbc

## 2021-06-30 NOTE — Assessment & Plan Note (Addendum)
Age and sex appropriate education and counseling updated with regular exercise and diet Referrals for preventative services - none needed Immunizations addressed - declines covid booster, shingrix, pneumovax Smoking counseling  - counseled to qut, pt not ready Evidence for depression or other mood disorder - chronic anxiety being treated Most recent labs reviewed. I have personally reviewed and have noted: 1) the patient's medical and social history 2) The patient's current medications and supplements 3) The patient's height, weight, and BMI have been recorded in the chart

## 2021-06-30 NOTE — Assessment & Plan Note (Signed)
Last vitamin D Lab Results  Component Value Date   VD25OH 40.01 06/26/2021   Stable, cont oral replacement

## 2021-06-30 NOTE — Assessment & Plan Note (Signed)
Pt not wearing her brace today; encouraged to do so, f/u PT and Dr Vertell Limber

## 2021-06-30 NOTE — Assessment & Plan Note (Signed)
Agree with current pain management, and pt to f/u with pain management as planned

## 2021-06-30 NOTE — Assessment & Plan Note (Signed)
Lab Results  Component Value Date   HGBA1C 5.2 10/01/2018   Stable, pt to continue current medical treatment  - diet

## 2021-07-03 ENCOUNTER — Encounter: Payer: Self-pay | Admitting: Pulmonary Disease

## 2021-07-03 ENCOUNTER — Other Ambulatory Visit: Payer: Self-pay

## 2021-07-03 ENCOUNTER — Ambulatory Visit: Payer: Medicare HMO | Admitting: Pulmonary Disease

## 2021-07-03 VITALS — BP 128/86 | HR 100 | Ht 63.0 in | Wt 100.8 lb

## 2021-07-03 DIAGNOSIS — G4734 Idiopathic sleep related nonobstructive alveolar hypoventilation: Secondary | ICD-10-CM

## 2021-07-03 DIAGNOSIS — F17210 Nicotine dependence, cigarettes, uncomplicated: Secondary | ICD-10-CM

## 2021-07-03 DIAGNOSIS — I509 Heart failure, unspecified: Secondary | ICD-10-CM | POA: Diagnosis not present

## 2021-07-03 DIAGNOSIS — J439 Emphysema, unspecified: Secondary | ICD-10-CM | POA: Diagnosis not present

## 2021-07-03 DIAGNOSIS — R69 Illness, unspecified: Secondary | ICD-10-CM | POA: Diagnosis not present

## 2021-07-03 NOTE — Progress Notes (Signed)
Synopsis: Referred in July 2022 for Asthma/COPD by Krista Blue, PA  Subjective:   PATIENT ID: Denise King GENDER: female DOB: 1951-10-19, MRN: [id number]   HPI  Chief Complaint  Patient presents with   Consult    Referred by ED for COPD. Was in the hospital on 6/26. Still has some SOB with exertion.     Denise King is a 70 year old woman, daily smoker with HFpEF, GERD, and COPD who is referred to pulmonary clinic for evaluation of Asthma/COPD.  She had a recent hospital admission at the beginning of May for hypercapnic respiratory failure in the setting of narcotic use for her chronic back pain and lumbar fractures with underlying emphysema.   She has done well since that hospitalization denies any cough, sputum production or wheezing.  She denies any concerns for shortness of breath at this time.  She is using 2 L of supplemental oxygen at night and has been on this for about a year now.  She denies any trouble with GERD or seasonal allergies.  She continues to smoke about a pack per day.  She has a 25-pack-year smoking history.  She did quit smoking during each of her pregnancies.  She is currently interested in quitting smoking.  Past Medical History:  Diagnosis Date   Acute cystitis    Acute respiratory failure ([facility])    Allergy    as a child grew out of them   Anemia    pernicious anemia   Anxiety    Arthritis    Back pain    Blood transfusion    CAP (community acquired pneumonia)    CHF (congestive heart failure) (HCC)    Chronic pain syndrome    Common bile duct stone    Depression    Diverticulosis of colon    Duodenal ulcer hemorrhagic-after biliary sphincterotomy    Dysthymia    Esophagitis    EtOH dependence (HCC)    Gastritis    GERD (gastroesophageal reflux disease)    H/O chest pain Dec. 2013   no work up done   Hx of colonic polyps    Hypertension    Irritable bowel syndrome    Lymphocytic colitis 02/16/2020   Osteopenia     Osteoporosis    Pneumonia 2019   Polypharmacy    Reflux esophagitis    Right sided sciatica 12/22/2017   Tobacco use disorder    Unspecified chronic bronchitis (HCC)    Vertigo    Vitamin B12 deficiency    Wears glasses      Family History  Problem Relation Age of Onset   Colon cancer Father    Prostate cancer Father    Heart disease Mother        prev MVR, also has DJD   Colon cancer Brother    Skin cancer Sister    Alcohol abuse Daughter    Bipolar disorder Daughter    Drug abuse Daughter    Esophageal cancer Neg Hx    Rectal cancer Neg Hx    Stomach cancer Neg Hx      Social History   Socioeconomic History   Marital status: Married    Spouse name: Not on file   Number of children: 4   Years of education: 16   Highest education level: Not on file  Occupational History   Occupation: retired    Comment: disablility  Tobacco Use   Smoking status: Every Day    Packs/day: 1.00  Years: 30.00    Pack years: 30.00    Types: Cigarettes   Smokeless tobacco: Never  Vaping Use   Vaping Use: Never used  Substance and Sexual Activity   Alcohol use: Yes    Alcohol/week: 3.0 standard drinks    Types: 3 Standard drinks or equivalent per week    Comment: occasional   Drug use: No   Sexual activity: Yes  Other Topics Concern   Not on file  Social History Narrative   Married x 38 yrs. Has BS degree in Horticulture from [employer]. Currently resides in a house with her husband. 3 cats.  Fun: used to like to do a lot of things.    Denies religious beliefs that would effect health care.    lives with husband [name]; quit smoking- October 6th, 2019. ocasional alcohol. [employer] [2009] on disability sec to back issues.    Social Determinants of Health   Financial Resource Strain: Not on file  Food Insecurity: Not on file  Transportation Needs: No Transportation Needs   Lack of Transportation (Medical): No   Lack of Transportation (Non-Medical): No   Physical Activity: Not on file  Stress: Not on file  Social Connections: Moderately Isolated   Frequency of Communication with Friends and Family: More than three times a week   Frequency of Social Gatherings with Friends and Family: Once a week   Attends Religious Services: Never   Marine scientist or Organizations: No   Attends Music therapist: Never   Marital Status: Married  Human resources officer Violence: Not on file     Allergies  Allergen Reactions   Atorvastatin Nausea And Vomiting   Levofloxacin Other (See Comments)    Achilles tendon pain   Omnicef [Cefdinir] Diarrhea and Other (See Comments)    Uncontrollable diarrhea   Trazodone And Nefazodone Other (See Comments)    "Felt like I was going to faint"   Sulfa Antibiotics Rash   Sulfonamide Derivatives Rash     Outpatient Medications Prior to Visit  Medication Sig Dispense Refill   albuterol (VENTOLIN HFA) 108 (90 Base) MCG/ACT inhaler Inhale 1-2 puffs into the lungs every 6 (six) hours as needed for wheezing or shortness of breath.     budesonide (ENTOCORT EC) 3 MG 24 hr capsule TAKE 3 CAPSULES ONCE DAILY. 90 capsule 1   buPROPion (WELLBUTRIN SR) 150 MG 12 hr tablet Take 1 tablet (150 mg total) by mouth daily. 90 tablet 1   cyclobenzaprine (FLEXERIL) 5 MG tablet Take 1 tablet (5 mg total) by mouth 3 (three) times daily as needed for muscle spasms. 60 tablet 2   denosumab (PROLIA) 60 MG/ML SOSY injection Inject 60 mg into the skin every 6 (six) months.     diclofenac Sodium (VOLTAREN) 1 % GEL Apply 2 g topically 4 (four) times daily as needed (for joint pain).     DULoxetine (CYMBALTA) 30 MG capsule Take 30 mg by mouth daily.     hyoscyamine (ANASPAZ) 0.125 MG TBDP disintergrating tablet Place 1 tablet (0.125 mg total) under the tongue every 6 (six) hours as needed ([name] cramps). 90 tablet 3   Melatonin 10 MG TABS Take 10 mg by mouth at bedtime as needed (sleep).     morphine (MS CONTIN) 15 MG 12 hr tablet  Take 1 tablet (15 mg total) by mouth 3 (three) times daily. 30 tablet 0   naloxone (NARCAN) nasal spray 4 mg/0.1 mL Place 1 spray into the nose as needed (overdose).  nicotine (NICODERM CQ - DOSED IN MG/24 HOURS) 21 mg/24hr patch Place 1 patch (21 mg total) onto the skin daily. 28 patch 0   nitroGLYCERIN (NITROSTAT) 0.4 MG SL tablet Place 1 tablet (0.4 mg total) under the tongue every 5 (five) minutes as needed for chest pain. 25 tablet 3   ondansetron (ZOFRAN-ODT) 4 MG disintegrating tablet DISSOLVE 1 TABLET ON TONGUE EVERY 8 HOURS AS NEEDED FOR NAUSEA/VOMITING. 30 tablet 3   Oxycodone HCl 10 MG TABS Take 1 tablet (10 mg total) by mouth 2 (two) times daily as needed. 1 tablet 0   pantoprazole (PROTONIX) 40 MG tablet TAKE (1) TABLET TWICE A DAY BEFORE MEALS. 60 tablet 11   polyethylene glycol (MIRALAX) 17 g packet Take 17 g by mouth 2 (two) times daily. 14 each 0   rosuvastatin (CRESTOR) 5 MG tablet Take 1 tablet (5 mg total) by mouth 3 (three) times a week. 39 tablet 3   sennosides-docusate sodium (SENOKOT-S) 8.6-50 MG tablet Take 2 tablets by mouth 2 (two) times daily.     sertraline (ZOLOFT) 100 MG tablet Take 2 tablets (200 mg total) by mouth at bedtime. 180 tablet 1   spironolactone (ALDACTONE) 25 MG tablet Take 0.5 tablets (12.5 mg total) by mouth daily. 45 tablet 3   valACYclovir (VALTREX) 500 MG tablet Take 500 mg by mouth 2 (two) times daily as needed (flare ups).     potassium chloride (KLOR-CON) 10 MEQ tablet Take 1 tablet (10 mEq total) by mouth daily. 30 tablet 0   sucralfate (CARAFATE) 1 g tablet Take 1 tablet (1 g total) by mouth 4 (four) times daily -  with meals and at bedtime for 14 days. (Patient taking differently: Take 1 g by mouth in the morning and at bedtime.) 56 tablet 0   No facility-administered medications prior to visit.    Review of Systems  Constitutional:  Negative for chills, fever, malaise/fatigue and weight loss.  HENT:  Negative for congestion, sinus pain  and sore throat.   Eyes: Negative.   Respiratory:  Negative for cough, hemoptysis, sputum production, shortness of breath and wheezing.   Cardiovascular:  Negative for chest pain, palpitations, orthopnea, claudication and leg swelling.  Gastrointestinal:  Negative for abdominal pain, heartburn, nausea and vomiting.  Genitourinary: Negative.   Musculoskeletal:  Positive for back pain. Negative for joint pain and myalgias.  Skin:  Negative for rash.  Neurological:  Negative for weakness.  Endo/Heme/Allergies: Negative.   Psychiatric/Behavioral: Negative.       Objective:   Vitals:   07/03/21 1418  BP: 128/86  Pulse: 100  SpO2: 96%  Weight: 100 lb 12.8 oz (45.7 kg)  Height: 5\' 3"  (1.6 m)     Physical Exam Constitutional:      General: She is not in acute distress.    Appearance: She is not ill-appearing.     Comments: Thin woman  HENT:     Head: Normocephalic and atraumatic.  Eyes:     General: No scleral icterus.    Conjunctiva/sclera: Conjunctivae normal.     Pupils: Pupils are equal, round, and reactive to light.  Cardiovascular:     Rate and Rhythm: Normal rate and regular rhythm.     Pulses: Normal pulses.     Heart sounds: Normal heart sounds. No murmur heard. Pulmonary:     Effort: Pulmonary effort is normal.     Breath sounds: Decreased breath sounds present. No wheezing, rhonchi or rales.     Comments: Back brace in  place Abdominal:     General: Bowel sounds are normal.     Palpations: Abdomen is soft.  Musculoskeletal:     Right lower leg: No edema.     Left lower leg: No edema.  Lymphadenopathy:     Cervical: No cervical adenopathy.  Skin:    General: Skin is warm and dry.     Findings: Bruising present.  Neurological:     General: No focal deficit present.     Mental Status: She is alert.  Psychiatric:        Mood and Affect: Mood normal.        Behavior: Behavior normal.        Thought Content: Thought content normal.        Judgment: Judgment  normal.      CBC    Component Value Date/Time   WBC 7.0 06/26/2021 1452   RBC 4.19 06/26/2021 1452   HGB 13.0 06/26/2021 1452   HGB 15.9 05/09/2019 1551   HCT 39.0 06/26/2021 1452   HCT 46.9 (H) 05/09/2019 1551   PLT 322.0 06/26/2021 1452   PLT 245 05/09/2019 1551   MCV 93.3 06/26/2021 1452   MCV 96 05/09/2019 1551   MCH 30.8 06/24/2021 0304   MCHC 33.2 06/26/2021 1452   RDW 14.0 06/26/2021 1452   RDW 13.6 05/09/2019 1551   LYMPHSABS 2.7 06/26/2021 1452   LYMPHSABS 3.3 (H) 05/09/2019 1551   MONOABS 0.7 06/26/2021 1452   EOSABS 0.1 06/26/2021 1452   EOSABS 0.0 05/09/2019 1551   BASOSABS 0.0 06/26/2021 1452   BASOSABS 0.0 05/09/2019 1551   BMP Latest Ref Rng & Units 06/26/2021 06/24/2021 06/23/2021  Glucose 70 - 99 mg/dL 88 84 90  BUN 6 - 23 mg/dL 6 7(L) 6(L)  Creatinine 0.40 - 1.20 mg/dL 0.72 0.81 0.74  BUN/Creat Ratio 6 - 22 (calc) - - -  Sodium 135 - 145 mEq/L 140 136 140  Potassium 3.5 - 5.1 mEq/L 4.4 3.7 4.7  Chloride 96 - 112 mEq/L 104 105 106  CO2 19 - 32 mEq/L 29 22 26   Calcium 8.4 - 10.5 mg/dL 8.2(L) 7.3(L) 7.8(L)   Chest imaging: CTA Chest 06/22/21 Mediastinum/Nodes: No mediastinal, hilar, or axillary adenopathy. Trachea and esophagus are unremarkable. Thyroid unremarkable.   Lungs/Pleura: Mild emphysema. Patchy opacity at the right lung base could reflect atelectasis or infiltrate/pneumonia. Linear atelectasis at the left base. No effusions.  PFT: No flowsheet data found.  Echo 07/08/20: LVEF 60-65%. Grade I diastolic dysfunction. RV systolic function is normal. RV size is normal.   Heart Catheterization 05/11/2019: Mid RCA lesion is 50% stenosed. Prox Cx to Mid Cx lesion is 30% stenosed. Prox LAD to Mid LAD lesion is 50% stenosed. LV end diastolic pressure is low.   1.  Mild to moderate diffuse calcified stenosis of the proximal to mid LAD 2.  Widely patent left main and left circumflex with calcified nonobstructive disease 3.  Mild to moderate  focal stenosis of the mid RCA, with lesion no greater than 50% does not appear flow-limiting 4.  Low LVEDP of 4 mmHg   Recommendations: Recommend medical therapy.  There is no target lesion for PCI.  The patient is a heavy smoker and tobacco cessation is recommended.  Assessment & Plan:   Pulmonary emphysema, unspecified emphysema type ([place]) - Plan: Pulmonary Function Test  Cigarette smoker - Plan: Ambulatory Referral for Lung Cancer Scre  Nocturnal hypoxemia  Discussion: Denise King is a 70 year old woman, daily smoker with  HFpEF, GERD, and COPD who is referred to pulmonary clinic for evaluation of Asthma/COPD.  She has mild emphysematous changes on CT chest scan from last month.  She clinically does not have any symptoms of cough, sputum production, wheezing or shortness of breath at this time.  She is to continue as needed albuterol for any of the above-mentioned symptoms.  We will obtain pulmonary function tests in the next couple of weeks and determine a maintenance inhaler based on these results if needed.  We discussed smoking cessation at length during today's visit.  She appears to be motivated to quit.  She is currently on Cymbalta, Wellbutrin and sertraline for anxiety/depression per her psychiatrist, so we will hold off on prescribing Chantix at this time unless deemed safe by her psychiatrist.  She is to start nicotine patches 21 mg daily along with nicotine gum/lozenges as needed for breakthrough cravings.  She is to continue on supplemental oxygen at night for nocturnal hypoxemia.  Follow-up in 3 months.  Freda Jackson, MD [facility] Office: [phone]    Current Outpatient Medications:    albuterol (VENTOLIN HFA) 108 (90 Base) MCG/ACT inhaler, Inhale 1-2 puffs into the lungs every 6 (six) hours as needed for wheezing or shortness of breath., Disp: , Rfl:    budesonide (ENTOCORT EC) 3 MG 24 hr capsule, TAKE 3 CAPSULES ONCE DAILY., Disp:  90 capsule, Rfl: 1   buPROPion (WELLBUTRIN SR) 150 MG 12 hr tablet, Take 1 tablet (150 mg total) by mouth daily., Disp: 90 tablet, Rfl: 1   cyclobenzaprine (FLEXERIL) 5 MG tablet, Take 1 tablet (5 mg total) by mouth 3 (three) times daily as needed for muscle spasms., Disp: 60 tablet, Rfl: 2   denosumab (PROLIA) 60 MG/ML SOSY injection, Inject 60 mg into the skin every 6 (six) months., Disp: , Rfl:    diclofenac Sodium (VOLTAREN) 1 % GEL, Apply 2 g topically 4 (four) times daily as needed (for joint pain)., Disp: , Rfl:    DULoxetine (CYMBALTA) 30 MG capsule, Take 30 mg by mouth daily., Disp: , Rfl:    hyoscyamine (ANASPAZ) 0.125 MG TBDP disintergrating tablet, Place 1 tablet (0.125 mg total) under the tongue every 6 (six) hours as needed ([name] cramps)., Disp: 90 tablet, Rfl: 3   Melatonin 10 MG TABS, Take 10 mg by mouth at bedtime as needed (sleep)., Disp: , Rfl:    morphine (MS CONTIN) 15 MG 12 hr tablet, Take 1 tablet (15 mg total) by mouth 3 (three) times daily., Disp: 30 tablet, Rfl: 0   naloxone (NARCAN) nasal spray 4 mg/0.1 mL, Place 1 spray into the nose as needed (overdose)., Disp: , Rfl:    nicotine (NICODERM CQ - DOSED IN MG/24 HOURS) 21 mg/24hr patch, Place 1 patch (21 mg total) onto the skin daily., Disp: 28 patch, Rfl: 0   nitroGLYCERIN (NITROSTAT) 0.4 MG SL tablet, Place 1 tablet (0.4 mg total) under the tongue every 5 (five) minutes as needed for chest pain., Disp: 25 tablet, Rfl: 3   ondansetron (ZOFRAN-ODT) 4 MG disintegrating tablet, DISSOLVE 1 TABLET ON TONGUE EVERY 8 HOURS AS NEEDED FOR NAUSEA/VOMITING., Disp: 30 tablet, Rfl: 3   Oxycodone HCl 10 MG TABS, Take 1 tablet (10 mg total) by mouth 2 (two) times daily as needed., Disp: 1 tablet, Rfl: 0   pantoprazole (PROTONIX) 40 MG tablet, TAKE (1) TABLET TWICE A DAY BEFORE MEALS., Disp: 60 tablet, Rfl: 11   polyethylene glycol (MIRALAX) 17 g packet, Take 17 g  by mouth 2 (two) times daily., Disp: 14 each, Rfl: 0   rosuvastatin (CRESTOR)  5 MG tablet, Take 1 tablet (5 mg total) by mouth 3 (three) times a week., Disp: 39 tablet, Rfl: 3   sennosides-docusate sodium (SENOKOT-S) 8.6-50 MG tablet, Take 2 tablets by mouth 2 (two) times daily., Disp: , Rfl:    sertraline (ZOLOFT) 100 MG tablet, Take 2 tablets (200 mg total) by mouth at bedtime., Disp: 180 tablet, Rfl: 1   spironolactone (ALDACTONE) 25 MG tablet, Take 0.5 tablets (12.5 mg total) by mouth daily., Disp: 45 tablet, Rfl: 3   valACYclovir (VALTREX) 500 MG tablet, Take 500 mg by mouth 2 (two) times daily as needed (flare ups)., Disp: , Rfl:    potassium chloride (KLOR-CON) 10 MEQ tablet, Take 1 tablet (10 mEq total) by mouth daily., Disp: 30 tablet, Rfl: 0   sucralfate (CARAFATE) 1 g tablet, Take 1 tablet (1 g total) by mouth 4 (four) times daily -  with meals and at bedtime for 14 days. (Patient taking differently: Take 1 g by mouth in the morning and at bedtime.), Disp: 56 tablet, Rfl: 0

## 2021-07-03 NOTE — Patient Instructions (Addendum)
You have mild emphysema based on your CT chest last month.  We will check pulmonary function tests in the next week or 2 and call you with the results and any recommendations on new inhaler therapy.  Continue albuterol 1 to 2 puffs as needed every 4-6 hours for shortness of breath, cough or wheezing.  For smoking cessation please start 21 mg daily nicotine patch along with nicotine gum and lozenges as needed for breakthrough cravings.  Discussed with your psychiatrist whether it is safe to use Chantix while on your other anxiety/depression medications.  We will refer you to our lung cancer screening program for a follow-up scan in September 2022 to follow-up on the opacity of your right lower lung on the CT scan from last month.

## 2021-07-04 ENCOUNTER — Encounter: Payer: Self-pay | Admitting: *Deleted

## 2021-07-04 ENCOUNTER — Telehealth: Payer: Self-pay | Admitting: *Deleted

## 2021-07-04 ENCOUNTER — Telehealth: Payer: Medicare HMO

## 2021-07-04 NOTE — Telephone Encounter (Signed)
  Chronic Care Management   Follow Up Note   07/04/2021 Name: Denise King MRN: [id number] DOB: March 28, 1951  Referred by: Biagio Borg, MD Reason for referral : Chronic Care Management (CCM RN CM Initial outreach attempt: visit re-scheduled per patient request)  An unsuccessful telephone outreach was attempted today. The patient was referred to the case management team for assistance with care management and care coordination.   Call placed as scheduled for CCM RN CM initial outreach/ assessment; patient answered phone; HIPAA/ identity verified.  Patient reported that she is currently at the hospital attending her husband who is a patient; she is unable to talk today for scheduled CCM RN CM initial outreach assessment appointment; requests re-scheduling of today's initial visit- this was completed according to patient preference.  Follow Up Plan:   CCM RN CM Initial outreach assessment re-scheduled with patient for Friday, July 11, 2021 at 9:30 am  Oneta Rack, RN, BSN, [place] [phone]: direct office [phone]: mobile

## 2021-07-07 DIAGNOSIS — R0781 Pleurodynia: Secondary | ICD-10-CM | POA: Diagnosis not present

## 2021-07-07 DIAGNOSIS — R079 Chest pain, unspecified: Secondary | ICD-10-CM | POA: Diagnosis not present

## 2021-07-08 DIAGNOSIS — R69 Illness, unspecified: Secondary | ICD-10-CM | POA: Diagnosis not present

## 2021-07-08 DIAGNOSIS — M549 Dorsalgia, unspecified: Secondary | ICD-10-CM | POA: Diagnosis not present

## 2021-07-08 DIAGNOSIS — Z1159 Encounter for screening for other viral diseases: Secondary | ICD-10-CM | POA: Diagnosis not present

## 2021-07-08 DIAGNOSIS — R5383 Other fatigue: Secondary | ICD-10-CM | POA: Diagnosis not present

## 2021-07-08 DIAGNOSIS — E559 Vitamin D deficiency, unspecified: Secondary | ICD-10-CM | POA: Diagnosis not present

## 2021-07-08 DIAGNOSIS — D539 Nutritional anemia, unspecified: Secondary | ICD-10-CM | POA: Diagnosis not present

## 2021-07-08 DIAGNOSIS — F17210 Nicotine dependence, cigarettes, uncomplicated: Secondary | ICD-10-CM | POA: Diagnosis not present

## 2021-07-08 DIAGNOSIS — Z79899 Other long term (current) drug therapy: Secondary | ICD-10-CM | POA: Diagnosis not present

## 2021-07-08 DIAGNOSIS — F17200 Nicotine dependence, unspecified, uncomplicated: Secondary | ICD-10-CM | POA: Diagnosis not present

## 2021-07-08 DIAGNOSIS — M25561 Pain in right knee: Secondary | ICD-10-CM | POA: Diagnosis not present

## 2021-07-09 DIAGNOSIS — I509 Heart failure, unspecified: Secondary | ICD-10-CM | POA: Diagnosis not present

## 2021-07-10 DIAGNOSIS — Z79899 Other long term (current) drug therapy: Secondary | ICD-10-CM | POA: Diagnosis not present

## 2021-07-11 ENCOUNTER — Telehealth: Payer: Self-pay | Admitting: *Deleted

## 2021-07-11 ENCOUNTER — Encounter: Payer: Self-pay | Admitting: *Deleted

## 2021-07-11 ENCOUNTER — Telehealth: Payer: Medicare HMO

## 2021-07-11 NOTE — Telephone Encounter (Signed)
  Chronic Care Management   Follow Up Note   07/11/2021 Name: Denise King MRN: [id number] DOB: 1951-12-07   Referred by: Biagio Borg, MD Reason for referral : Chronic Care Management (CCM RN CM Initial Telephone Outreach: Unsuccessful attempt)  An unsuccessful telephone outreach was attempted today. The patient was referred to the case management team for assistance with care management and care coordination.   Follow Up Plan:  A HIPPA compliant phone message was left for the patient providing contact information and requesting a return call Will place request with scheduling care guide to contact patient to re-schedule today's missed CCM RN initial telephone appointment  Oneta Rack, RN, BSN, [place] [phone]: direct office [phone]: mobile

## 2021-07-15 ENCOUNTER — Encounter: Payer: Self-pay | Admitting: Internal Medicine

## 2021-07-15 ENCOUNTER — Ambulatory Visit (INDEPENDENT_AMBULATORY_CARE_PROVIDER_SITE_OTHER): Payer: Medicare HMO | Admitting: Internal Medicine

## 2021-07-15 VITALS — BP 120/60 | HR 64 | Ht 63.0 in | Wt 111.0 lb

## 2021-07-15 DIAGNOSIS — K52832 Lymphocytic colitis: Secondary | ICD-10-CM

## 2021-07-15 NOTE — Patient Instructions (Addendum)
Keep using the budesonide 2-3 a day as you are.  I hope you get some relief regarding the compression fractures.  I appreciate the opportunity to care for you. Gatha Mayer, MD, [facility]

## 2021-07-15 NOTE — Progress Notes (Signed)
MEIA EMLEY 70 y.o. January 29, 1951 [id number]  Assessment & Plan:   Encounter Diagnosis  Name Primary?   Lymphocytic colitis Yes    Continue current regimen of budesonide 6-9 mg daily.  Titrate lower dose if possible.  Ideally would be best not be on steroids given compression fractures though her osteoporosis osteopenia issues is not related to chronic steroids in my opinion as this is a relatively recent development i.e. we started these last year.  Note she was previously screened for celiac disease in 2015 with a normal IgA level she had a whole panel with slightly elevated D deamidated gliadin antibodies but everything else was negative.  She has also had normal duodenal biopsies in 2015.  Thus I do not think she has celiac disease.  She is to return in 6 months sooner as needed Subjective:   Chief Complaint: Follow-up of lymphocytic colitis  HPI [name] is doing well taking anywhere from 6 to 9 mg of budesonide daily she titrates her effectiveness.  If she has a day or 2 with multiple bowel movements she will take 9 mg until it calms down and then she will go back to 6 mg.  She was last seen in the spring, in the interim she was admitted to the hospital after multiple falls and toxic metabolic encephalopathy related to narcotic use.  This is not the first time she has been admitted with this problem.  She was discovered to have compression fractures and is wearing a brace and is waiting to see Dr. Vertell Limber of neurosurgery. Allergies  Allergen Reactions   Atorvastatin Nausea And Vomiting   Levofloxacin Other (See Comments)    Achilles tendon pain   Omnicef [Cefdinir] Diarrhea and Other (See Comments)    Uncontrollable diarrhea   Trazodone And Nefazodone Other (See Comments)    "Felt like I was going to faint"   Sulfa Antibiotics Rash   Sulfonamide Derivatives Rash   No outpatient medications have been marked as taking for the 07/15/21 encounter (Appointment) with Gatha Mayer, MD.   Past Medical History:  Diagnosis Date   Acute cystitis    Acute respiratory failure ([place])    Allergy    as a child grew out of them   Anemia    pernicious anemia   Anxiety    Arthritis    Back pain    Blood transfusion    CAP (community acquired pneumonia)    CHF (congestive heart failure) (HCC)    Chronic pain syndrome    Common bile duct stone    Depression    Diverticulosis of colon    Duodenal ulcer hemorrhagic-after biliary sphincterotomy    Dysthymia    Esophagitis    EtOH dependence ([place])    Gastritis    GERD (gastroesophageal reflux disease)    H/O chest pain Dec. 2013   no work up done   Hx of colonic polyps    Hypertension    Irritable bowel syndrome    Lymphocytic colitis 02/16/2020   Osteopenia    Osteoporosis    Pneumonia 2019   Polypharmacy    Reflux esophagitis    Right sided sciatica 12/22/2017   Tobacco use disorder    Unspecified chronic bronchitis (HCC)    Vertigo    Vitamin B12 deficiency    Wears glasses    Past Surgical History:  Procedure Laterality Date   APPENDECTOMY     BACK SURGERY  10-09   Dr. Patrice Paradise   BIOPSY  02/14/2020   Procedure: BIOPSY;  Surgeon: Gatha Mayer, MD;  Location: [facility];  Service: Endoscopy;;   CARPAL TUNNEL RELEASE Right 08/14/2014   Procedure: RIGHT CARPAL TUNNEL RELEASE AND INJECT LEFT THUMB;  Surgeon: Daryll Brod, MD;  Location: [place];  Service: Orthopedics;  Laterality: Right;   CHOLECYSTECTOMY N/A 02/22/2020   Procedure: LAPAROSCOPIC CHOLECYSTECTOMY WITH INTRAOPERATIVE CHOLANGIOGRAM;  Surgeon: Kieth Brightly Arta Bruce, MD;  Location: [facility];  Service: General;  Laterality: N/A;   COLONOSCOPY     COLONOSCOPY WITH PROPOFOL N/A 02/14/2020   Procedure: COLONOSCOPY WITH PROPOFOL;  Surgeon: Gatha Mayer, MD;  Location: [facility];  Service: Endoscopy;  Laterality: N/A;   ENDOSCOPIC RETROGRADE CHOLANGIOPANCREATOGRAPHY (ERCP) WITH PROPOFOL N/A 12/25/2019   Procedure: ENDOSCOPIC  RETROGRADE CHOLANGIOPANCREATOGRAPHY (ERCP) WITH PROPOFOL;  Surgeon: Gatha Mayer, MD;  Location: [facility];  Service: Gastroenterology;  Laterality: N/A;   ESOPHAGOGASTRODUODENOSCOPY (EGD) WITH PROPOFOL N/A 01/06/2020   Procedure: ESOPHAGOGASTRODUODENOSCOPY (EGD) WITH PROPOFOL;  Surgeon: Irene Shipper, MD;  Location: [facility];  Service: Endoscopy;  Laterality: N/A;   HOT HEMOSTASIS N/A 01/06/2020   Procedure: HOT HEMOSTASIS (ARGON PLASMA COAGULATION/BICAP);  Surgeon: Irene Shipper, MD;  Location: [facility] ENDOSCOPY;  Service: Endoscopy;  Laterality: N/A;   INTRAMEDULLARY (IM) NAIL INTERTROCHANTERIC Right 10/01/2018   Procedure: INTRAMEDULLARY (IM) NAIL INTERTROCHANTRIC;  Surgeon: Thornton Park, MD;  Location: [facility];  Service: Orthopedics;  Laterality: Right;   LAPAROSCOPY N/A 02/21/2013   Procedure: LAPAROSCOPY OPERATIVE;  Surgeon: Margarette Asal, MD;  Location: [facility];  Service: Gynecology;  Laterality: N/A;  REQUESTING 5MM SCOPE WITH CAMERA   LEFT HEART CATH AND CORONARY ANGIOGRAPHY N/A 05/11/2019   Procedure: LEFT HEART CATH AND CORONARY ANGIOGRAPHY;  Surgeon: Sherren Mocha, MD;  Location: [place] [facility];  Service: Cardiovascular;  Laterality: N/A;   REMOVAL OF STONES  12/25/2019   Procedure: Karlyn Agee;  Surgeon: Gatha Mayer, MD;  Location: [facility] ENDOSCOPY;  Service: Gastroenterology;;   Clide Deutscher  01/06/2020   Procedure: Clide Deutscher;  Surgeon: Irene Shipper, MD;  Location: [facility] ENDOSCOPY;  Service: Endoscopy;;   SPHINCTEROTOMY  12/25/2019   Procedure: Joan Mayans;  Surgeon: Gatha Mayer, MD;  Location: [facility] ENDOSCOPY;  Service: Gastroenterology;;   TONSILLECTOMY     TUBAL LIGATION     UPPER GASTROINTESTINAL ENDOSCOPY     Social History   Social History Narrative   Married x 38 yrs. Has BS degree in Horticulture from [employer]. Currently resides in a house with her husband. 3 cats.  Fun: used to like to do a lot of things.    Denies religious beliefs that would effect  health care.    lives with husband [name]; quit smoking- October 6th, 2019. ocasional alcohol. [employer] [2009] on disability sec to back issues.    family history includes Alcohol abuse in her daughter; Bipolar disorder in her daughter; Colon cancer in her brother and father; Drug abuse in her daughter; Heart disease in her mother; Prostate cancer in her father; Skin cancer in her sister.   Review of Systems As above  Objective:   Physical Exam BP 120/60   Pulse 64   Ht 5\' 3"  (1.6 m)   Wt 111 lb (50.3 kg)   BMI 19.66 kg/m   Chronically ill elderly frail white woman, wearing a spinal brace

## 2021-07-22 DIAGNOSIS — Z79899 Other long term (current) drug therapy: Secondary | ICD-10-CM | POA: Diagnosis not present

## 2021-07-22 DIAGNOSIS — M25561 Pain in right knee: Secondary | ICD-10-CM | POA: Diagnosis not present

## 2021-07-22 DIAGNOSIS — M549 Dorsalgia, unspecified: Secondary | ICD-10-CM | POA: Diagnosis not present

## 2021-07-22 DIAGNOSIS — R69 Illness, unspecified: Secondary | ICD-10-CM | POA: Diagnosis not present

## 2021-07-22 DIAGNOSIS — F17200 Nicotine dependence, unspecified, uncomplicated: Secondary | ICD-10-CM | POA: Diagnosis not present

## 2021-07-22 DIAGNOSIS — S22000S Wedge compression fracture of unspecified thoracic vertebra, sequela: Secondary | ICD-10-CM | POA: Diagnosis not present

## 2021-07-22 DIAGNOSIS — F17210 Nicotine dependence, cigarettes, uncomplicated: Secondary | ICD-10-CM | POA: Diagnosis not present

## 2021-07-23 ENCOUNTER — Telehealth: Payer: Self-pay | Admitting: Internal Medicine

## 2021-07-23 NOTE — Chronic Care Management (AMB) (Signed)
  Chronic Care Management   Outreach Note  07/23/2021 Name: Denise King MRN: [id number] DOB: Feb 25, 1951  Referred by: Biagio Borg, MD Reason for referral : No chief complaint on file.   An unsuccessful telephone outreach was attempted today. The patient was referred to the pharmacist for assistance with care management and care coordination.   Follow Up Plan:   Lauretta Grill Upstream Scheduler

## 2021-07-24 ENCOUNTER — Telehealth (HOSPITAL_COMMUNITY): Payer: Medicare HMO | Admitting: Psychiatry

## 2021-07-25 DIAGNOSIS — Z79899 Other long term (current) drug therapy: Secondary | ICD-10-CM | POA: Diagnosis not present

## 2021-08-03 DIAGNOSIS — I509 Heart failure, unspecified: Secondary | ICD-10-CM | POA: Diagnosis not present

## 2021-08-04 DIAGNOSIS — M546 Pain in thoracic spine: Secondary | ICD-10-CM | POA: Diagnosis not present

## 2021-08-04 DIAGNOSIS — S22060D Wedge compression fracture of T7-T8 vertebra, subsequent encounter for fracture with routine healing: Secondary | ICD-10-CM | POA: Diagnosis not present

## 2021-08-04 DIAGNOSIS — S22040S Wedge compression fracture of fourth thoracic vertebra, sequela: Secondary | ICD-10-CM | POA: Diagnosis not present

## 2021-08-04 DIAGNOSIS — G8929 Other chronic pain: Secondary | ICD-10-CM | POA: Diagnosis not present

## 2021-08-09 DIAGNOSIS — I509 Heart failure, unspecified: Secondary | ICD-10-CM | POA: Diagnosis not present

## 2021-08-12 ENCOUNTER — Telehealth: Payer: Self-pay | Admitting: *Deleted

## 2021-08-12 ENCOUNTER — Telehealth: Payer: Medicare HMO

## 2021-08-12 NOTE — Chronic Care Management (AMB) (Signed)
  Care Management   Note  08/12/2021 Name: Denise King MRN: [id number] DOB: Jun 24, 1951  Denise King is a 70 y.o. year old female who is a primary care patient of Biagio Borg, MD and is actively engaged with the care management team. I reached out to Denise King by phone today to assist with re-scheduling an initial visit with the RN Case Manager  Follow up plan: Telephone appointment with care management team member scheduled for: 08/21/2021  Julian Hy, [place], [place] Management  Direct Dial: [phone]

## 2021-08-12 NOTE — Telephone Encounter (Signed)
  Chronic Care Management   Follow Up Note   08/12/2021 Name: Denise King MRN: [id number] DOB: [birth year]  Referred by: Biagio Borg, MD Reason for referral : Chronic Care Management (CCM RN CM Initial Outreach- Unsuccessful attempt)  An unsuccessful telephone outreach was attempted today. The patient was referred to the case management team for assistance with care management and care coordination.   Follow Up Plan:  A HIPPA compliant phone message was left for the patient providing contact information and requesting a return call Will place request with scheduling care guide to contact patient to re-schedule today's missed CCM RN initial telephone appointment   Oneta Rack, RN, BSN, [place] [phone]: direct office [phone]: mobile

## 2021-08-21 ENCOUNTER — Encounter: Payer: Self-pay | Admitting: *Deleted

## 2021-08-21 ENCOUNTER — Telehealth: Payer: Medicare HMO

## 2021-08-21 ENCOUNTER — Telehealth: Payer: Self-pay | Admitting: *Deleted

## 2021-08-21 DIAGNOSIS — Z79899 Other long term (current) drug therapy: Secondary | ICD-10-CM | POA: Diagnosis not present

## 2021-08-21 DIAGNOSIS — M79645 Pain in left finger(s): Secondary | ICD-10-CM | POA: Diagnosis not present

## 2021-08-21 DIAGNOSIS — F17210 Nicotine dependence, cigarettes, uncomplicated: Secondary | ICD-10-CM | POA: Diagnosis not present

## 2021-08-21 DIAGNOSIS — G8929 Other chronic pain: Secondary | ICD-10-CM | POA: Diagnosis not present

## 2021-08-21 DIAGNOSIS — M549 Dorsalgia, unspecified: Secondary | ICD-10-CM | POA: Diagnosis not present

## 2021-08-21 DIAGNOSIS — R69 Illness, unspecified: Secondary | ICD-10-CM | POA: Diagnosis not present

## 2021-08-21 DIAGNOSIS — M545 Low back pain, unspecified: Secondary | ICD-10-CM | POA: Diagnosis not present

## 2021-08-21 NOTE — Telephone Encounter (Signed)
  Chronic Care Management   Follow Up Note   08/21/2021 Name: Denise King MRN: [id number] DOB: 07-03-1951   Referred by: Biagio Borg, MD Reason for referral : Chronic Care Management (CCM RN CM Initial Outreach- unsuccessful, patient requests call to be re-scheduled)  An unsuccessful telephone outreach was attempted today. The patient was referred to the case management team for assistance with care management and care coordination.   Upon contacting patient by phone for scheduled initial RN CCM RN phone call at 1:00 pm, HIPAA/ identity verified- she reports she is on her way to pain management clinic/ provider and does not have time to talk; she requests re-scheduling of call today-- this was completed with patient: call re-scheduled for tomorrow, Friday 08/22/21 at 1:00 pm  Follow Up Plan: Telephone follow up appointment with care management team member re-scheduled for:  Friday August 22, 2021 at 1:00 pm  Oneta Rack, RN, BSN, [place] [phone]: direct office [phone]: mobile

## 2021-08-22 ENCOUNTER — Ambulatory Visit (INDEPENDENT_AMBULATORY_CARE_PROVIDER_SITE_OTHER): Payer: Medicare HMO | Admitting: *Deleted

## 2021-08-22 DIAGNOSIS — G894 Chronic pain syndrome: Secondary | ICD-10-CM

## 2021-08-22 DIAGNOSIS — J449 Chronic obstructive pulmonary disease, unspecified: Secondary | ICD-10-CM

## 2021-08-22 NOTE — Chronic Care Management (AMB) (Signed)
  Chronic Care Management   Follow Up Note   08/22/2021 Name: Denise King MRN: [id number] DOB: 1951-09-04  Referred by: Biagio Borg, MD Reason for referral : No chief complaint on file.  Third unsuccessful telephone outreach was attempted today. The patient was referred to the case management team for assistance with care management and care coordination. The patient's primary care provider has been notified of our unsuccessful attempts to make or maintain contact with the patient. The care management team is pleased to engage with this patient at any time in the future should he/she be interested in assistance from the care management team.   HIPAA compliant voice message left for patient today, requesting call-back  Follow Up Plan: We have been unable to establish contact with the patient for initial outreach/ assessment. The care management team is available to follow up with the patient after provider conversation with the patient regarding recommendation for care management engagement and subsequent re-referral to the care management team.   Oneta Rack, RN, BSN, [place] [phone]: direct office [phone]: mobile

## 2021-08-22 NOTE — Patient Instructions (Signed)
Visit Information  Thank you for allowing me to share the care management and care coordination services that are available to you as part of your health plan and services through your primary care provider and medical home. Please reach out to me at [phone] if the care management/care coordination team may be of assistance to you in the future.   Oneta Rack, RN, BSN, [facility] RN Care Coordination- [place] [phone]: direct office [phone]: mobile

## 2021-08-25 DIAGNOSIS — Z79899 Other long term (current) drug therapy: Secondary | ICD-10-CM | POA: Diagnosis not present

## 2021-08-28 DIAGNOSIS — F319 Bipolar disorder, unspecified: Secondary | ICD-10-CM | POA: Diagnosis not present

## 2021-08-28 DIAGNOSIS — G4700 Insomnia, unspecified: Secondary | ICD-10-CM | POA: Diagnosis not present

## 2021-08-28 DIAGNOSIS — R69 Illness, unspecified: Secondary | ICD-10-CM | POA: Diagnosis not present

## 2021-08-28 DIAGNOSIS — J9611 Chronic respiratory failure with hypoxia: Secondary | ICD-10-CM | POA: Diagnosis not present

## 2021-08-28 DIAGNOSIS — F17210 Nicotine dependence, cigarettes, uncomplicated: Secondary | ICD-10-CM | POA: Diagnosis not present

## 2021-08-28 DIAGNOSIS — I739 Peripheral vascular disease, unspecified: Secondary | ICD-10-CM | POA: Diagnosis not present

## 2021-08-28 DIAGNOSIS — E785 Hyperlipidemia, unspecified: Secondary | ICD-10-CM | POA: Diagnosis not present

## 2021-08-28 DIAGNOSIS — F411 Generalized anxiety disorder: Secondary | ICD-10-CM | POA: Diagnosis not present

## 2021-08-28 DIAGNOSIS — J449 Chronic obstructive pulmonary disease, unspecified: Secondary | ICD-10-CM | POA: Diagnosis not present

## 2021-08-28 DIAGNOSIS — K5090 Crohn's disease, unspecified, without complications: Secondary | ICD-10-CM | POA: Diagnosis not present

## 2021-08-28 DIAGNOSIS — I110 Hypertensive heart disease with heart failure: Secondary | ICD-10-CM | POA: Diagnosis not present

## 2021-08-28 DIAGNOSIS — I509 Heart failure, unspecified: Secondary | ICD-10-CM | POA: Diagnosis not present

## 2021-08-28 DIAGNOSIS — E261 Secondary hyperaldosteronism: Secondary | ICD-10-CM | POA: Diagnosis not present

## 2021-09-03 DIAGNOSIS — I509 Heart failure, unspecified: Secondary | ICD-10-CM | POA: Diagnosis not present

## 2021-09-09 DIAGNOSIS — I509 Heart failure, unspecified: Secondary | ICD-10-CM | POA: Diagnosis not present

## 2021-09-12 IMAGING — CT CT ABD-PELV W/ CM
2 of 5 series · 15 of 46 positions shown, 17 images · IV contrast (omnipaque)
Comparison: 07/17/2017

CLINICAL DATA: Abdominal pain, LEFT side abdominal tenderness,
vomiting since 6166 hours, low-grade fever, chills, history of
ethanol dependence, GERD, hypertension, irritable bowel syndrome,
smoker

EXAM:
CT ABDOMEN AND PELVIS WITH CONTRAST
TECHNIQUE: Multidetector CT imaging of the abdomen and pelvis was performed
using the standard protocol following bolus administration of
intravenous contrast. Sagittal and coronal MPR images reconstructed
from axial data set.
CONTRAST:  100mL OMNIPAQUE IOHEXOL 300 MG/ML SOLN IV. No oral
contrast.

[Series 3: abd/ pelvis 5.0 i30f 2 · axial · 0.81mm/px · z∈[+673,+1048]mm · 12 of 85 slices shown, 14 images]
[im 5/85  soft-tissue]
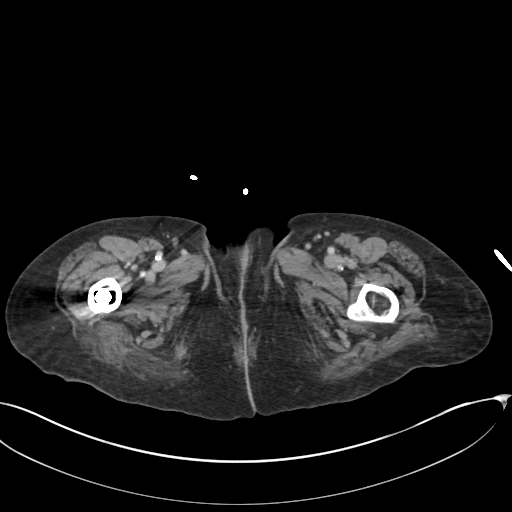
[im 5/85  bone]
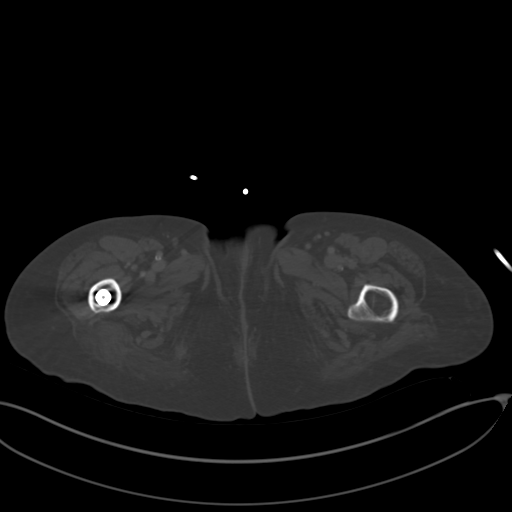
[im 14/85  soft-tissue]
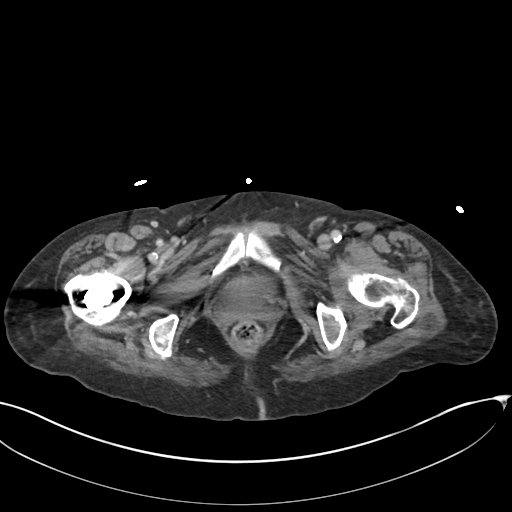
[im 18/85  soft-tissue]
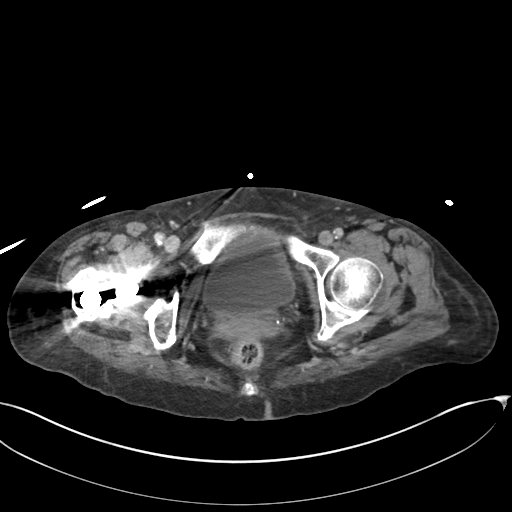
[im 27/85  soft-tissue]
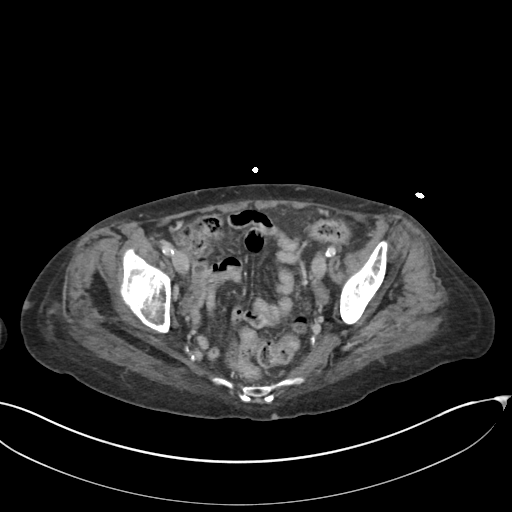
[im 31/85  soft-tissue]
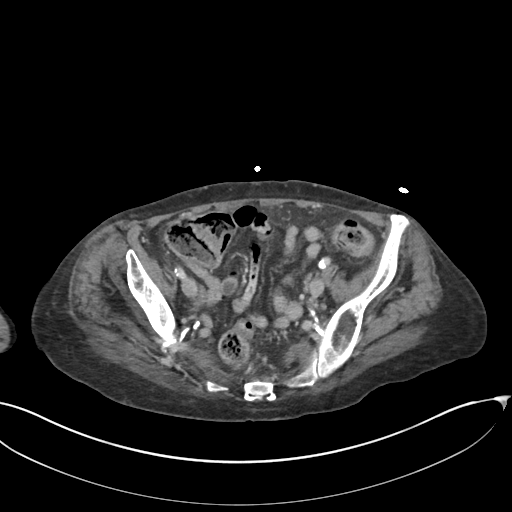
[im 40/85  soft-tissue]
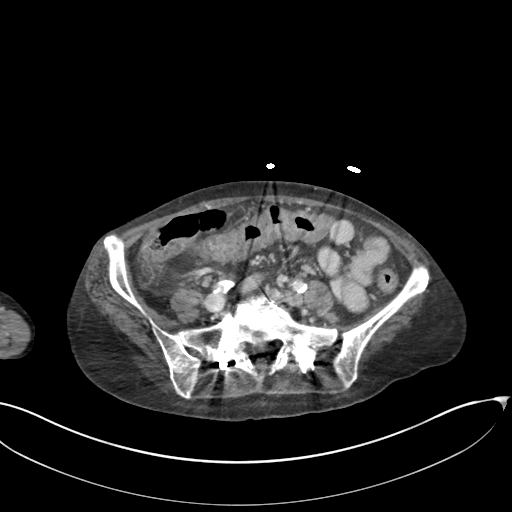
[im 45/85  soft-tissue]
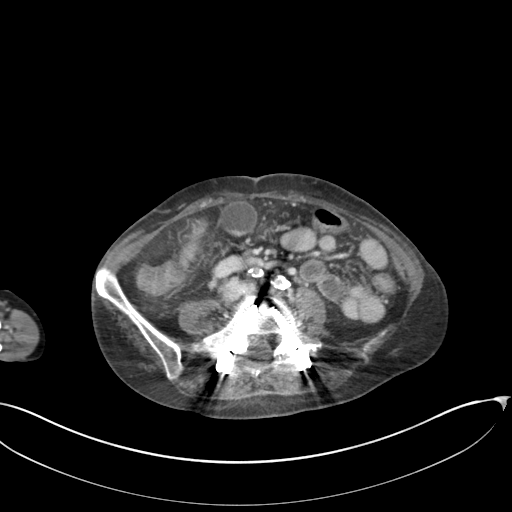
[im 54/85  soft-tissue]
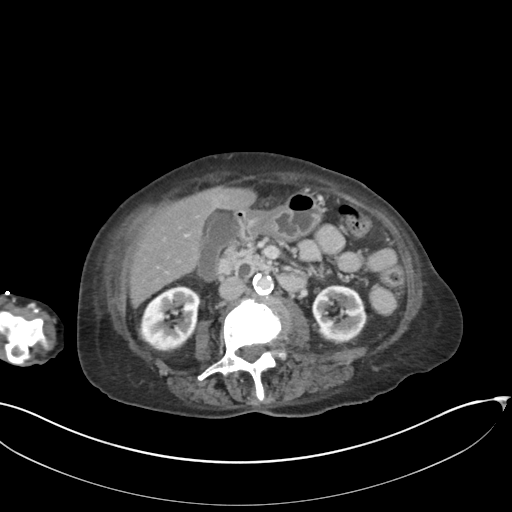
[im 58/85  soft-tissue]
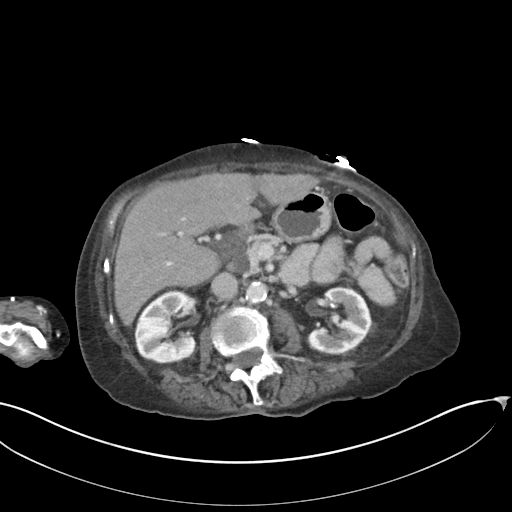
[im 58/85  bone]
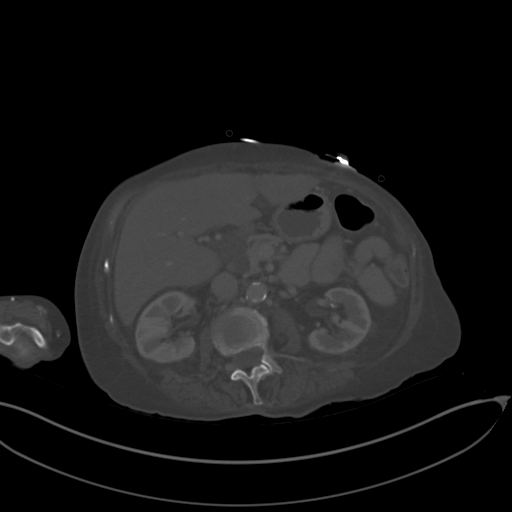
[im 67/85  soft-tissue]
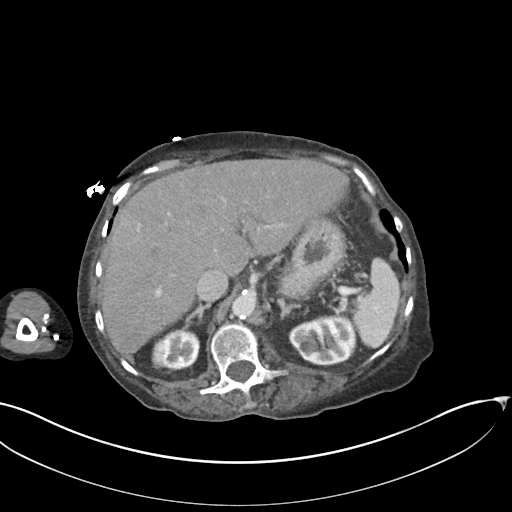
[im 71/85  soft-tissue]
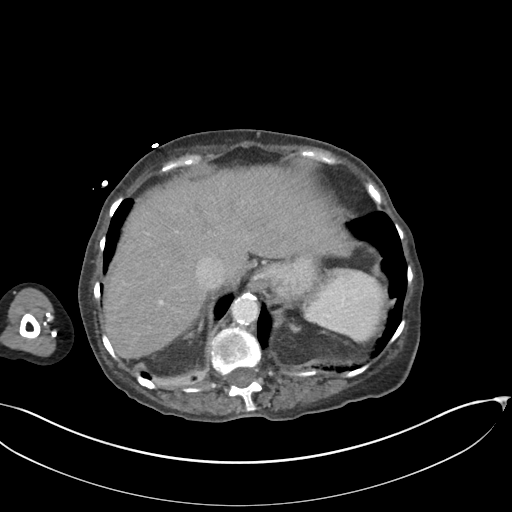
[im 80/85  soft-tissue]
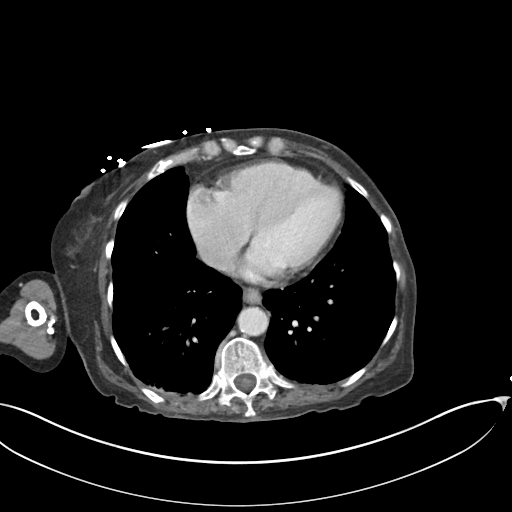

[Series 6: coronal soft tissue · coronal · 0.72mm/px · 3 of 101 slices shown]
[im 34/101  soft-tissue]
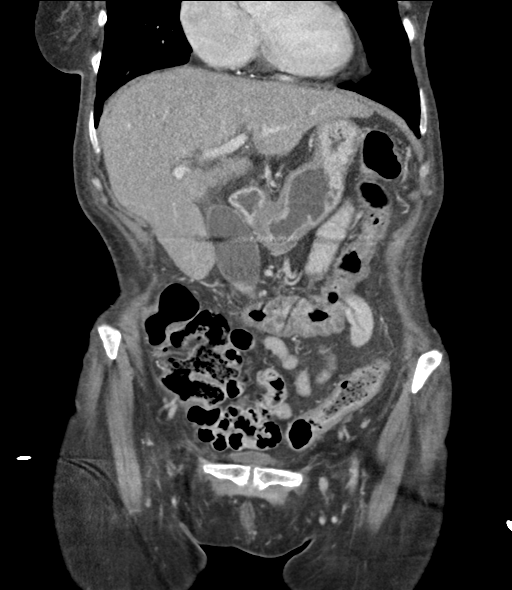
[im 45/101  soft-tissue]
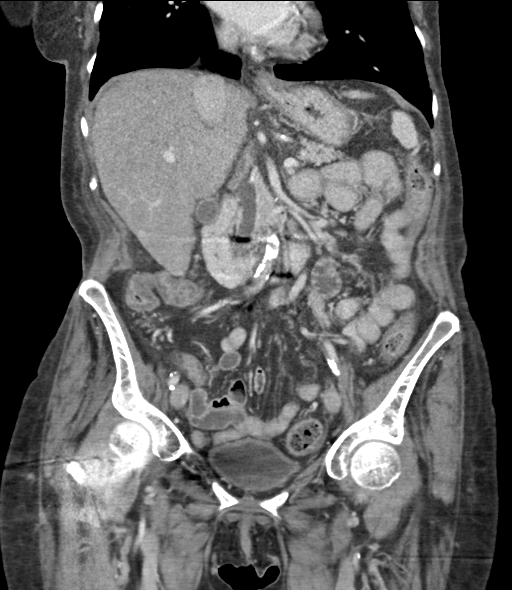
[im 56/101  soft-tissue]
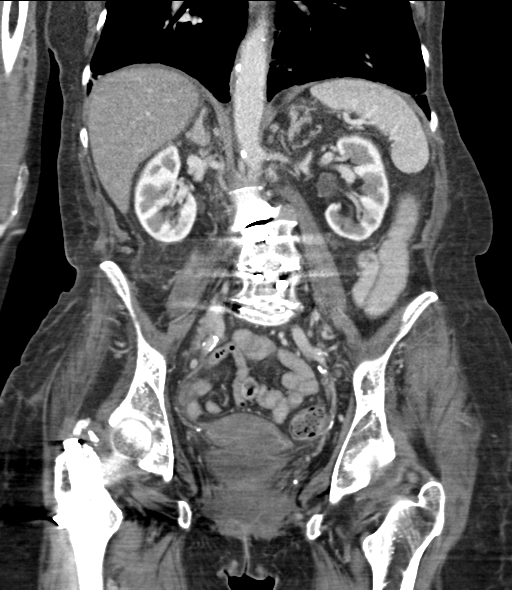

[15 of 46 positions shown; findings below may reference images not displayed]

FINDINGS: Lower chest: Subsegmental atelectasis dependently at both lung bases
greater on RIGHT

Hepatobiliary: Fatty infiltration of liver. Tiny nonspecific
low-attenuation focus anterior LEFT lobe likely 5 mm cyst unchanged.
Dependent density within gallbladder likely reflects cholelithiasis.
Mild pericholecystic edema. Dilated CBD 11 mm diameter. Filling
defect within distal CBD axial image 38 highly suspicious for
choledocholithiasis. Questionable additional subtle intraluminal
density on coronal images.

Pancreas: Unremarkable

Spleen: Normal appearance

Adrenals/Urinary Tract: Adrenal glands normal appearance. Cortical
thinning of both kidneys without renal mass or hydronephrosis. No
urinary tract calcification or ureteral dilatation. Bladder
unremarkable.

Stomach/Bowel: Appendix surgically absent by history. Bowel wall
thickening of transverse colon compatible with colitis. Remainder of
colon unremarkable. Small bowel loops normal appearance. Wall
thickening of gastric antrum with slight hyperenhancement of mucosa
question gastritis; no discrete ulcer identified.

Vascular/Lymphatic: Extensive atherosclerotic calcifications aorta,
iliac arteries, femoral arteries, coronary arteries. Aorta normal
caliber. No adenopathy.

Reproductive: Atrophic uterus and ovaries

Other: No free air or free fluid.  No hernia.

Musculoskeletal: Osseous demineralization. Orthopedic hardware
proximal RIGHT femur. Prior lumbar fusion L4-S1. Degenerative disc
disease changes L3-L4.
IMPRESSION: Bowel wall thickening of the transverse colon consistent with
colitis: consider inflammatory bowel disease and infection.

Edema of gastric antrum with hyperenhancement of the mucosa raising
question of gastritis, with no discrete ulcer identified.

Cholelithiasis with pericholecystic fluid, cannot exclude acute
cholecystitis.

Extrahepatic biliary dilatation with at least 1 filling defect in
distal CBD highly suspicious for choledocholithiasis; recommend
correlation with LFTs and consider MRCP imaging.

Fatty infiltration of liver.

Bibasilar atelectasis

Aortic Atherosclerosis ([id number]).

## 2021-09-12 IMAGING — CR DG CHEST 1V PORT
1 series · 1 of 1 positions shown · non-contrast
Comparison: 05/04/2019

CLINICAL DATA: Cough and shortness of breath.

EXAM:
PORTABLE CHEST 1 VIEW

[AP]
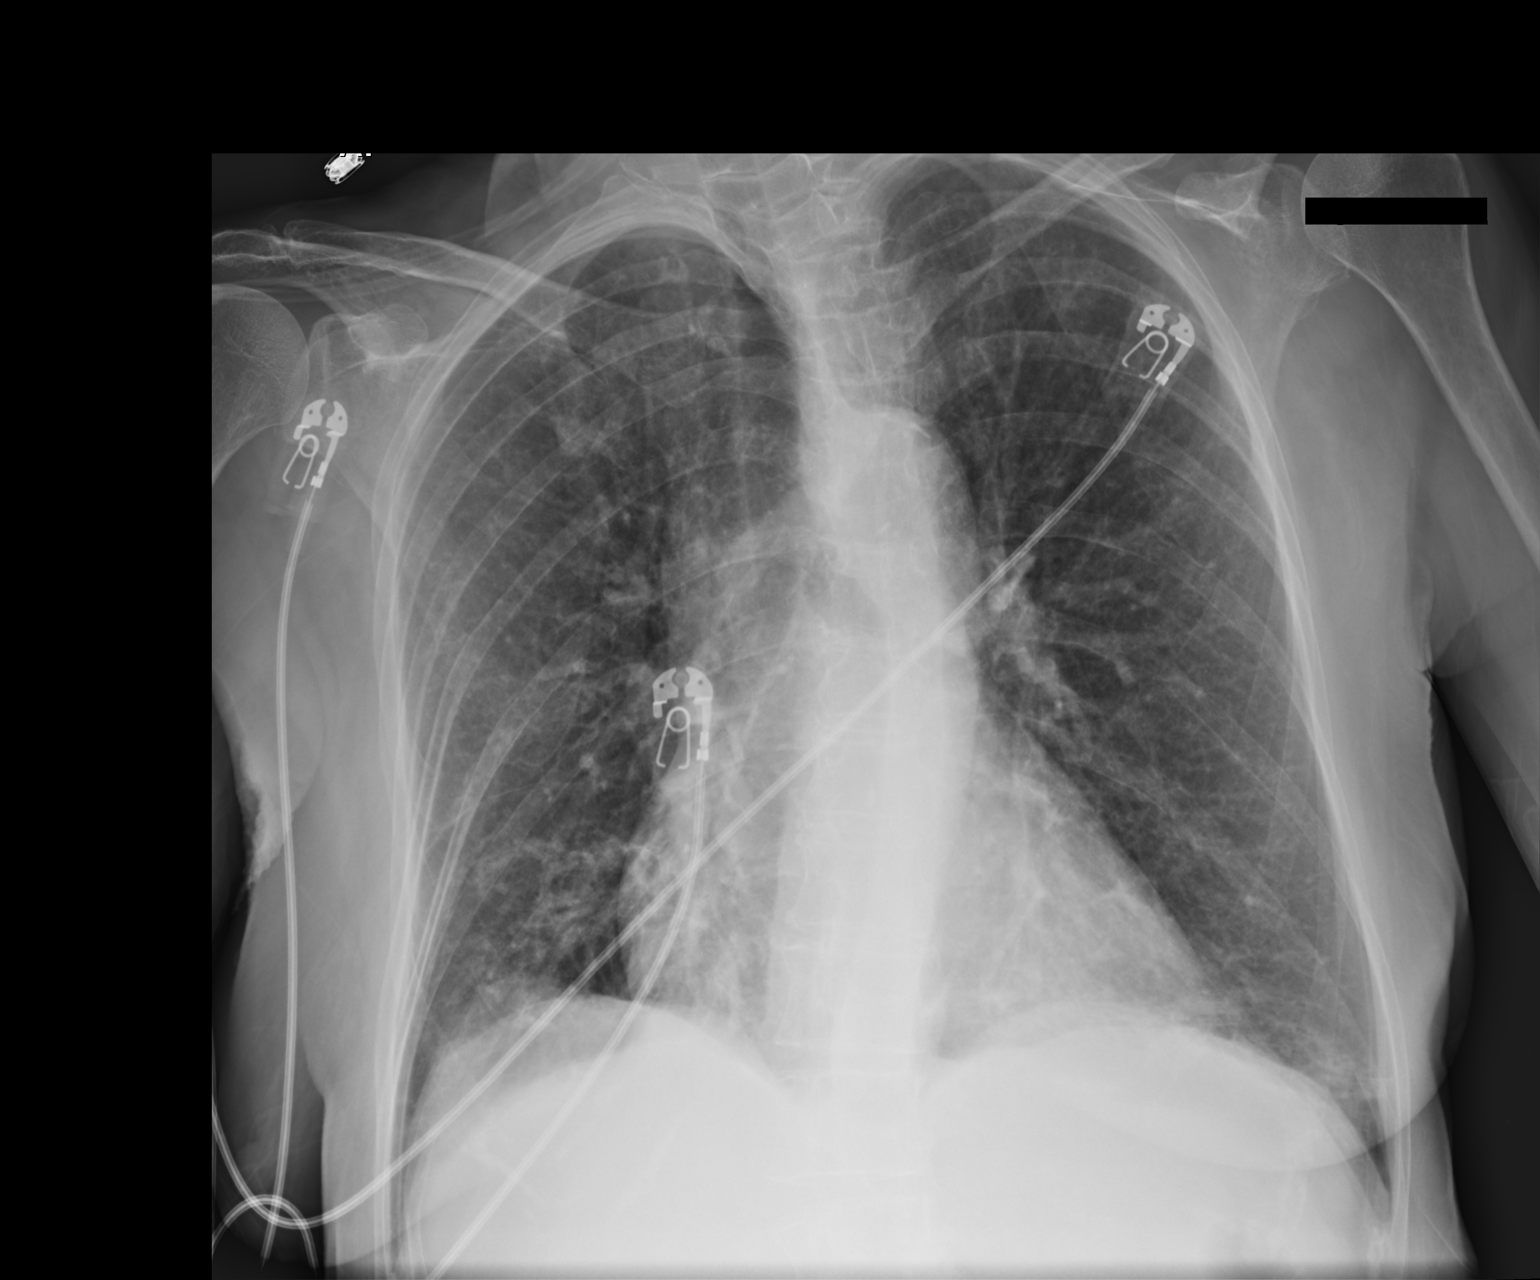

[1 of 1 positions shown; findings below may reference images not displayed]

FINDINGS: Lungs are hyperexpanded. Interstitial markings are diffusely
coarsened with chronic features. Mild patchy atelectasis or
infiltrate noted at the bases. No pleural effusion. Nodular density
noted right upper lobe, superimposed on the anterior end of the
first rib. Cardiopericardial silhouette is at upper limits of normal
for size. Bones are diffusely demineralized. Telemetry leads overlie
the chest.
IMPRESSION: 1. Emphysema with bibasilar atelectasis or infiltrate.
2. Nodular density in the right upper lobe may be related to the
anterior right first rib and. Follow-up PA and lateral chest x-ray
could be used to further assess. Alternatively, CT chest without
contrast could be used to further evaluate as clinically warranted.

## 2021-09-12 IMAGING — US US ABDOMEN LIMITED
2 series · 14 of 25 positions shown · non-contrast
Comparison: September 24, 2004

CLINICAL DATA: Abdomen pain and vomiting since last night

EXAM:
ULTRASOUND ABDOMEN LIMITED RIGHT UPPER QUADRANT

[Series 1: us abdomen limited · 13 of 68 slices shown (1 of 2)]
[im 1/68]
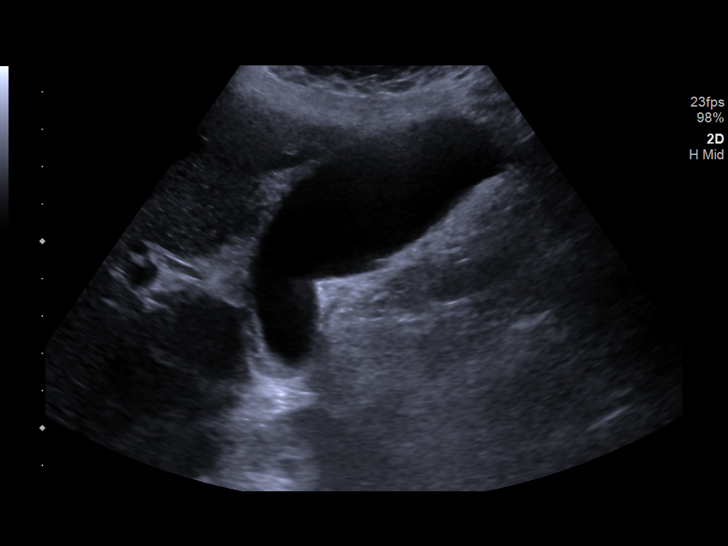
[im 6/68]
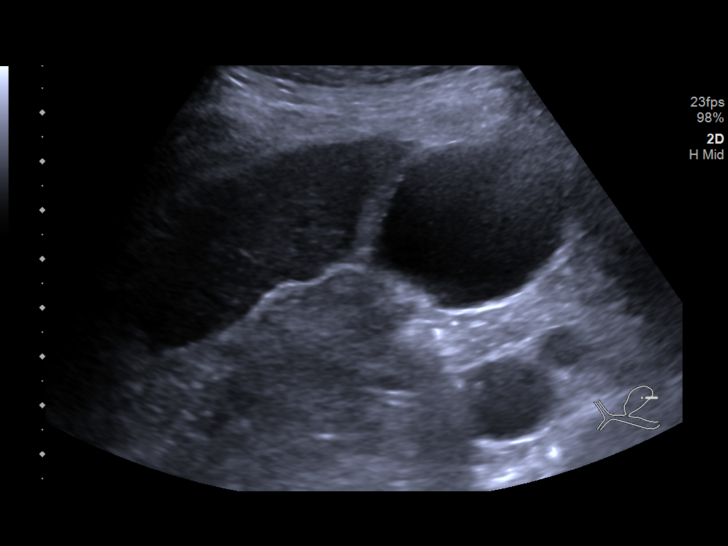
[im 12/68]
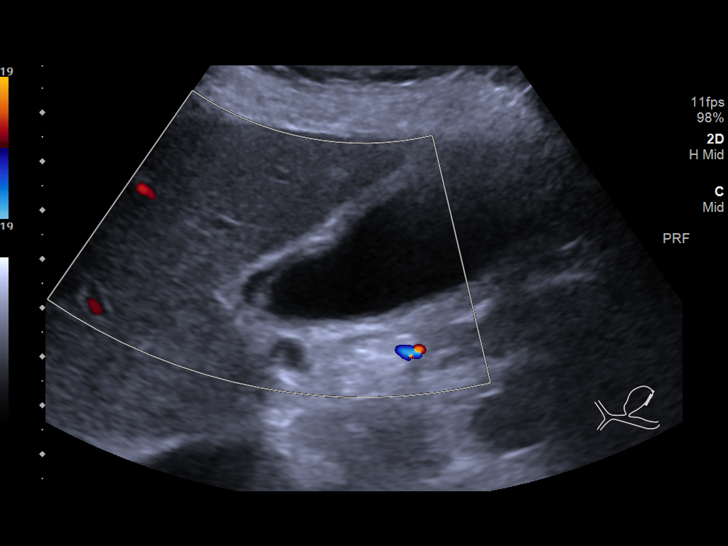
[im 18/68]
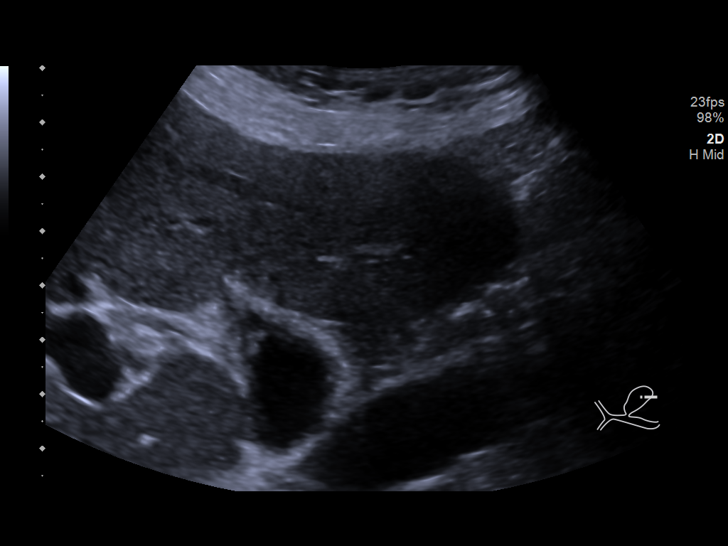
[im 24/68]
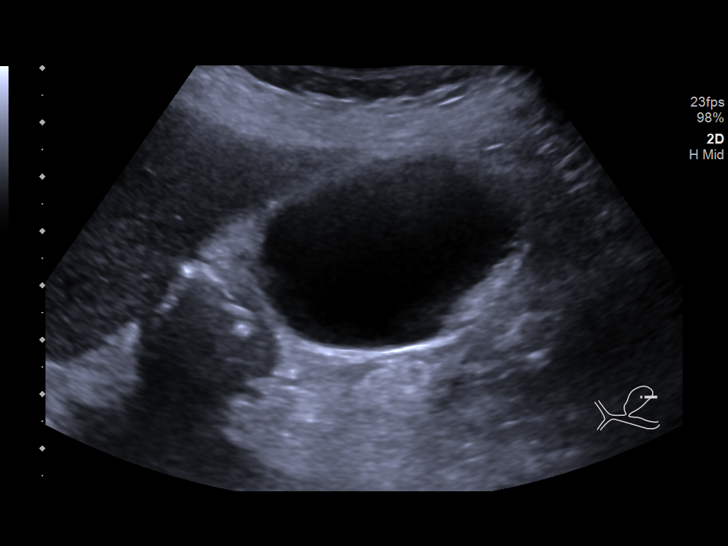
[im 27/68]
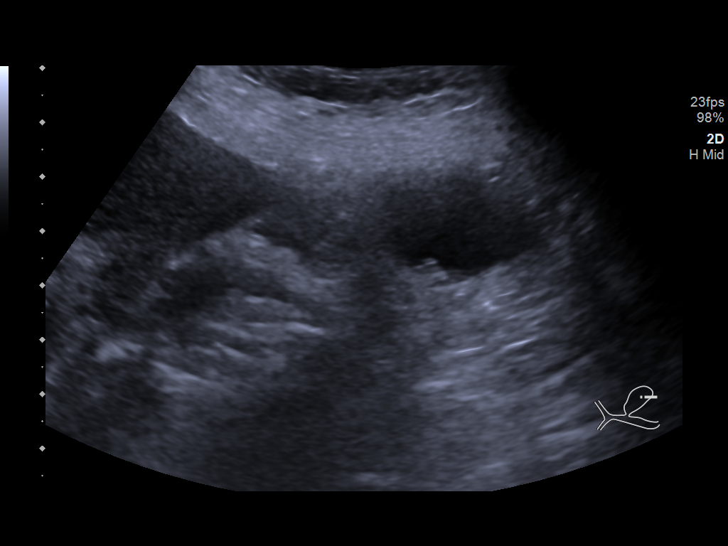
[im 33/68]
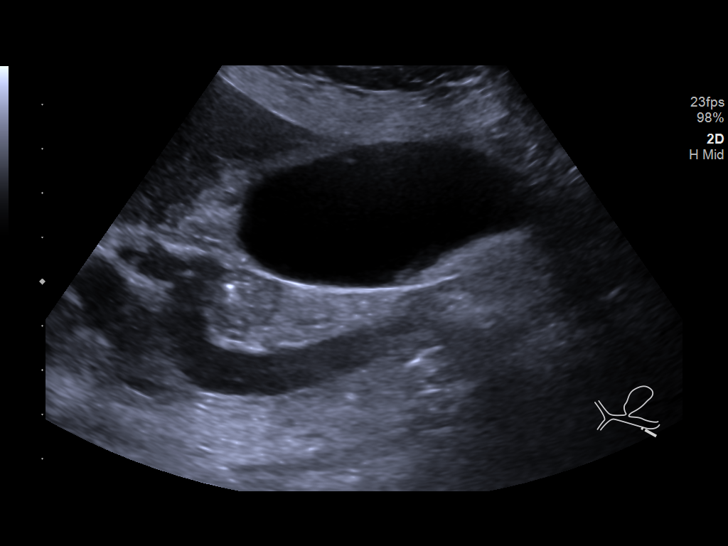
[im 38/68]
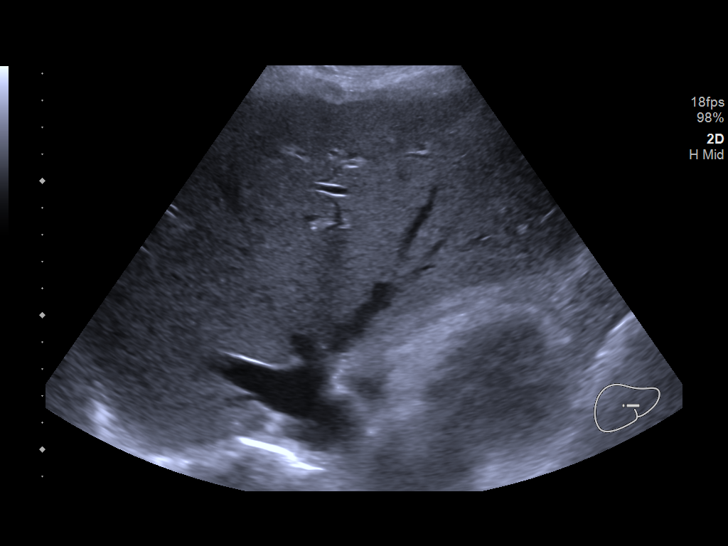
[im 44/68]
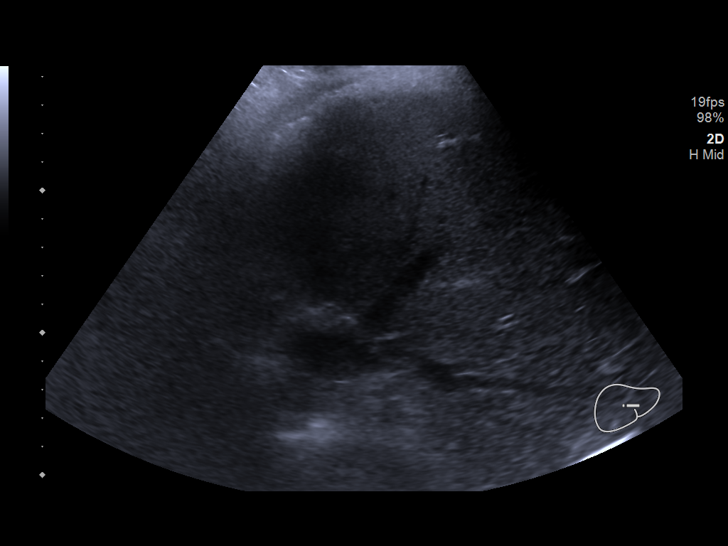
[im 47/68]
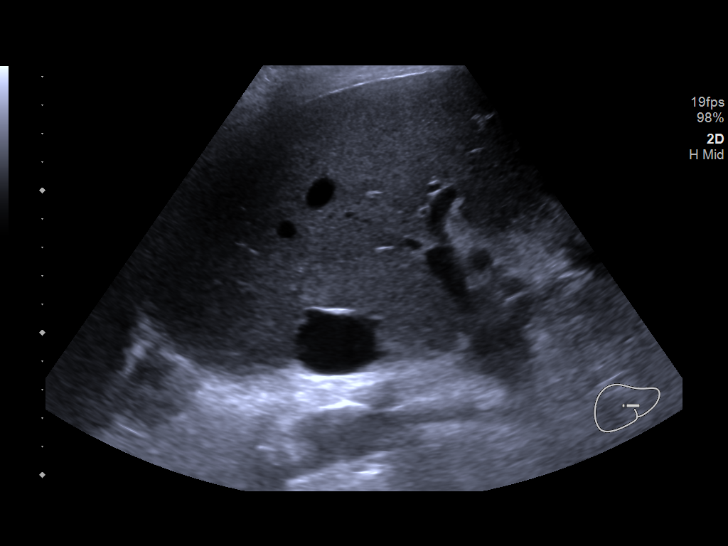
[im 53/68]
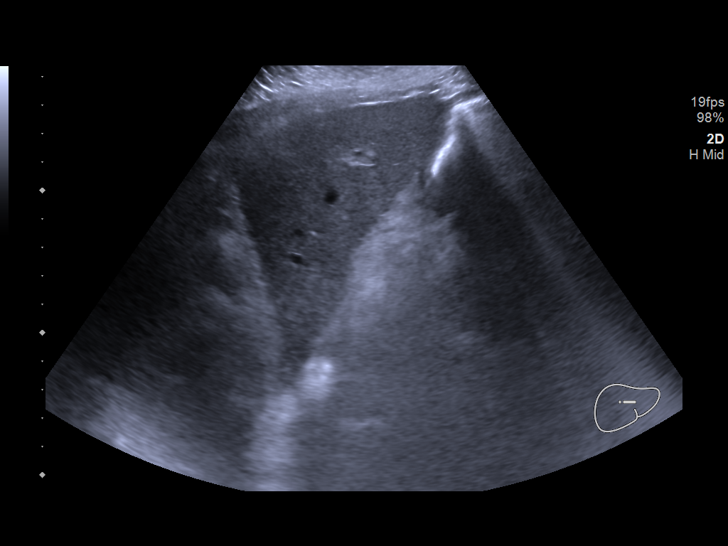
[im 59/68]
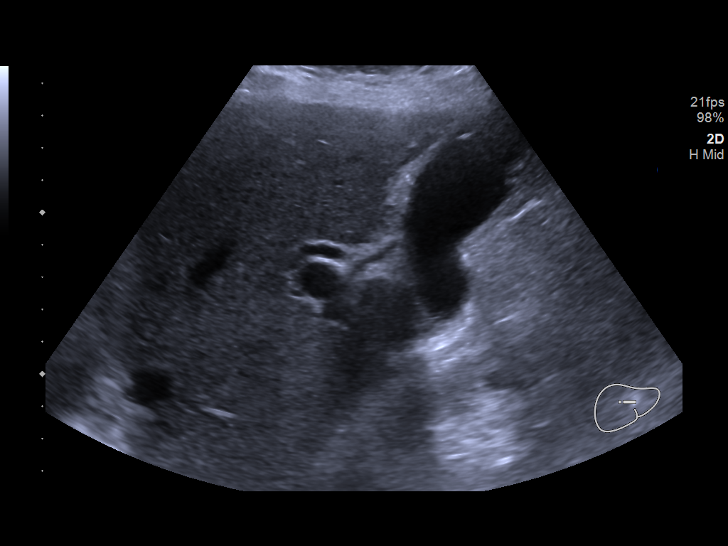
[im 65/68]
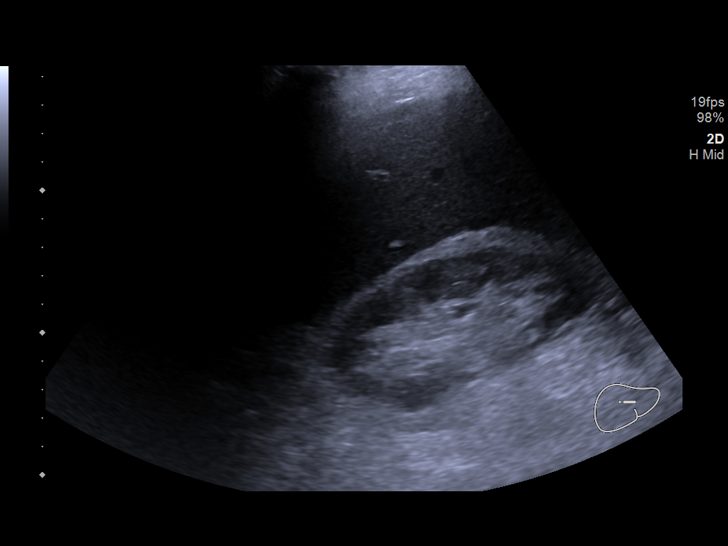

[Series 2: us abdomen limited · 1 of 1 slices shown (2 of 2)]
[im 1/1]
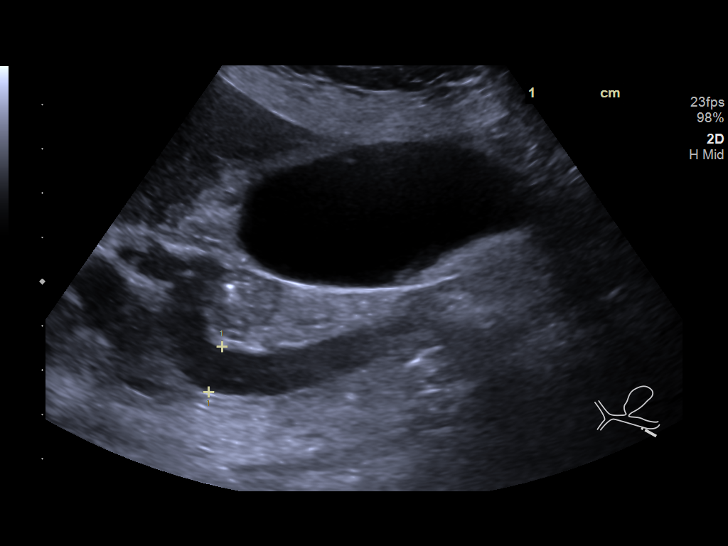

[14 of 25 positions shown; findings below may reference images not displayed]

FINDINGS: Gallbladder:

Gallstones are identified. There is pericholecystic fluid. No
sonographic Murphy sign is reported by the ultrasound technologist.
The gallbladder wall measures 3.4 mm.

Common bile duct:

Diameter: 10.7 mm

Liver:

No focal lesion identified. Within normal limits in parenchymal
echogenicity. Portal vein is patent on color Doppler imaging with
normal direction of blood flow towards the liver.

Other: None.
IMPRESSION: Gallstones identified in the gallbladder with pericholecystic fluid.
No sonographic Murphy sign is reported by the ultrasound
technologist. The findings are equivocal for acute cholecystitis.

## 2021-09-12 IMAGING — CT CT CHEST W/O CM
2 of 4 series · 15 of 36 positions shown, 18 images · non-contrast
Comparison: One-view chest x-ray 01/22/2019

CLINICAL DATA: Shortness of breath and hypoxia. Abnormal chest
x-ray.

EXAM:
CT CHEST WITHOUT CONTRAST
TECHNIQUE: Multidetector CT imaging of the chest was performed following the
standard protocol without IV contrast.

[Series 4: thorax 2.0 · axial · 0.64mm/px · z∈[+1108,+1374]mm · 12 of 149 slices shown, 15 images]
[im 8/149  mediastinal]
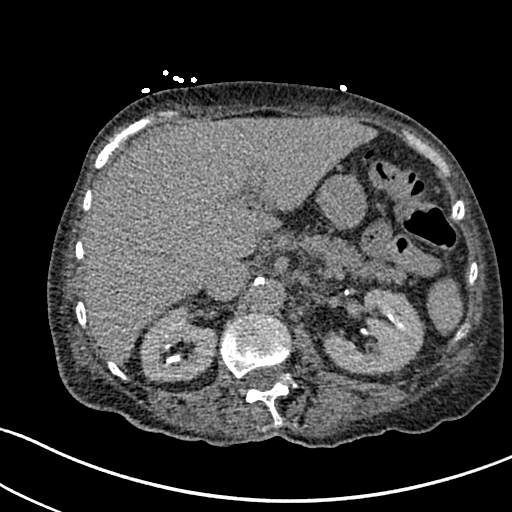
[im 8/149  lung]
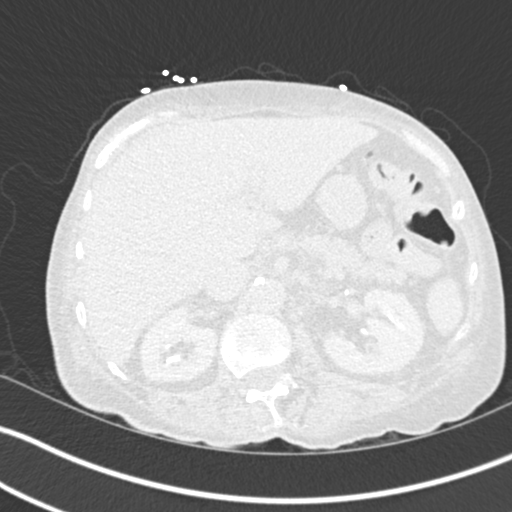
[im 24/149  lung]
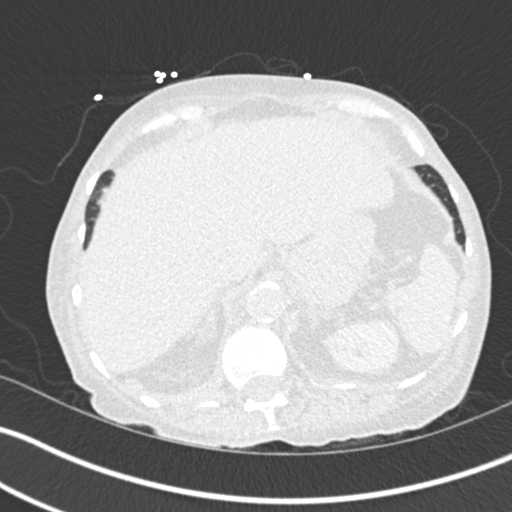
[im 32/149  lung]
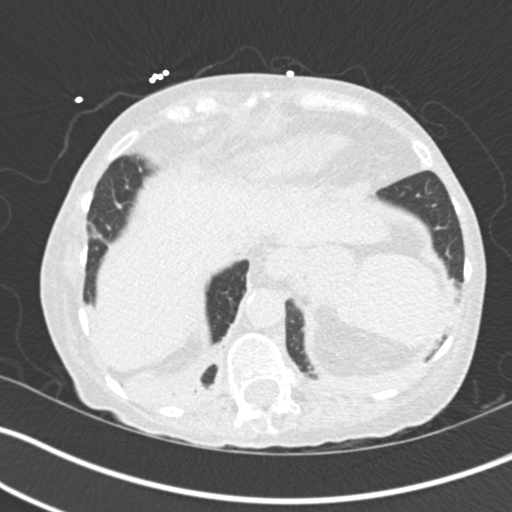
[im 47/149  lung]
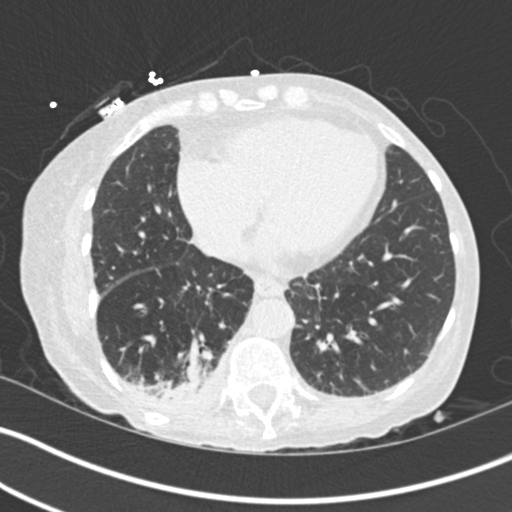
[im 55/149  mediastinal]
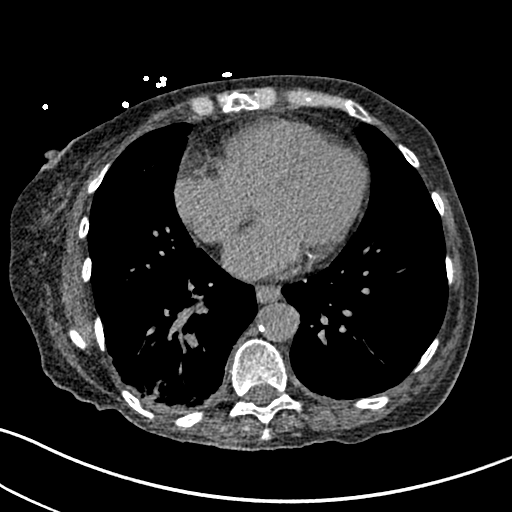
[im 55/149  lung]
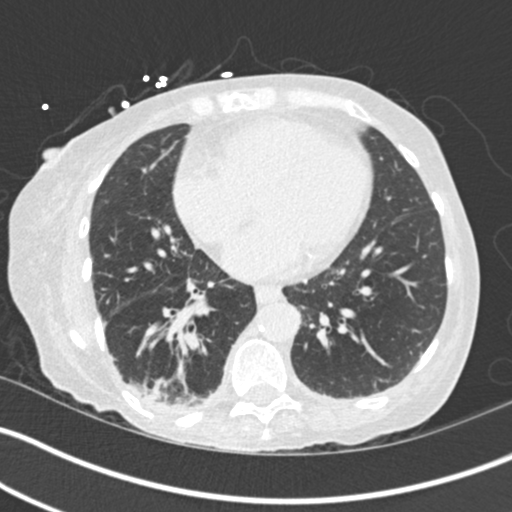
[im 71/149  lung]
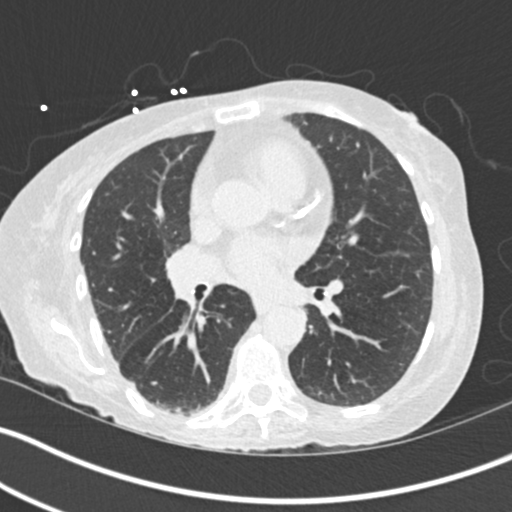
[im 78/149  lung]
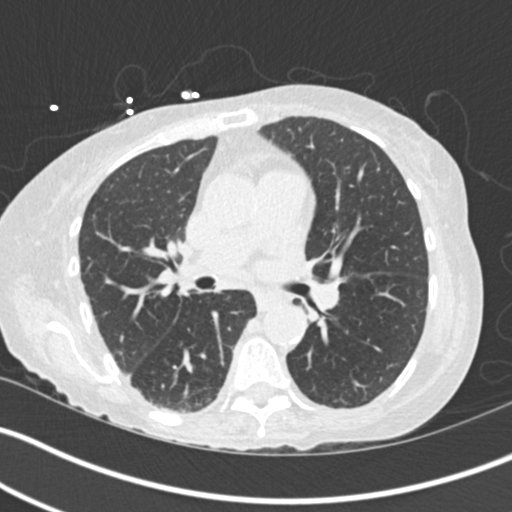
[im 94/149  lung]
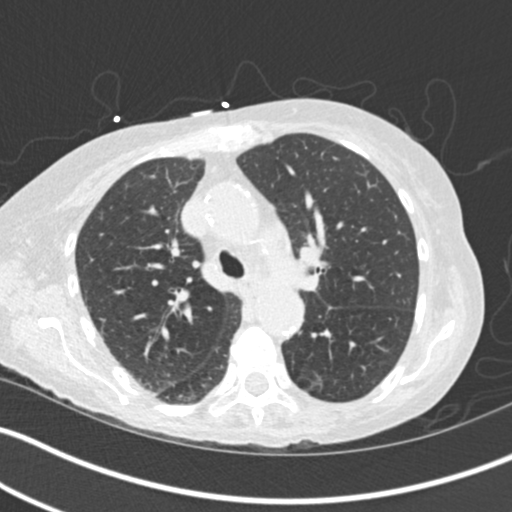
[im 102/149  mediastinal]
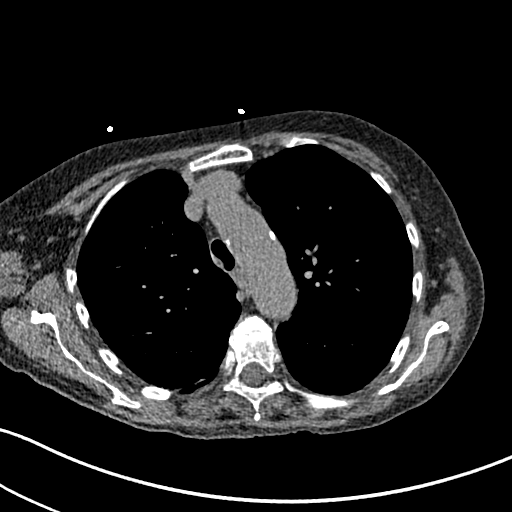
[im 102/149  lung]
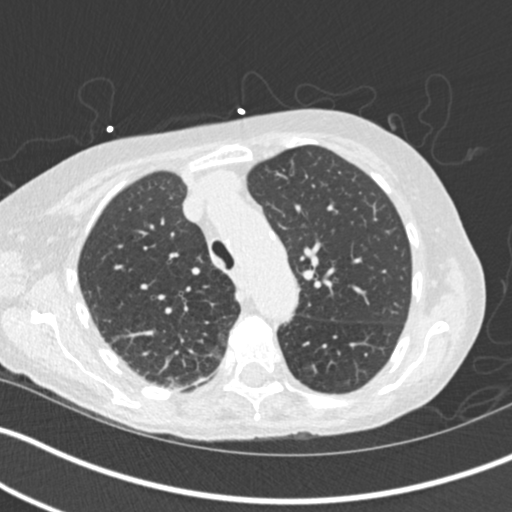
[im 117/149  lung]
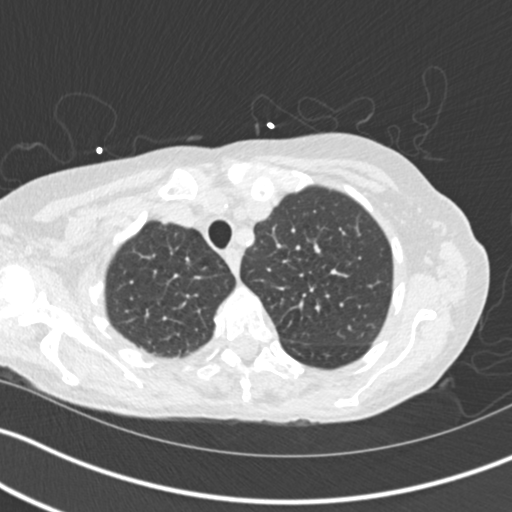
[im 125/149  lung]
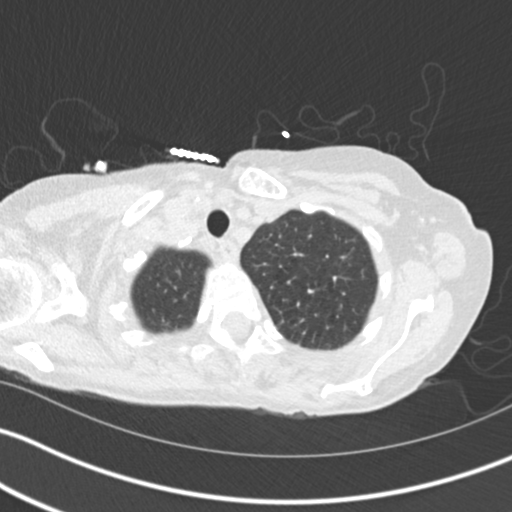
[im 141/149  lung]
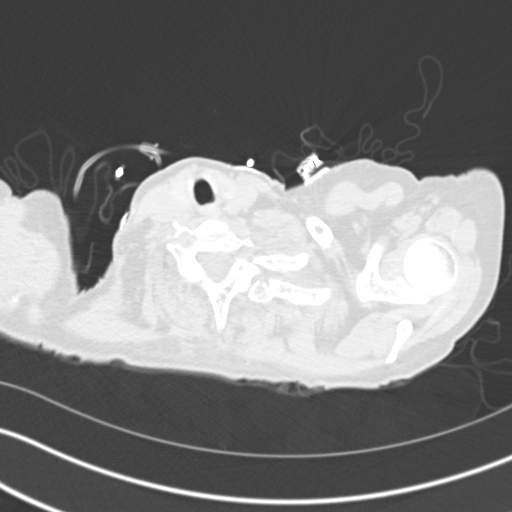

[Series 7: coronal · coronal · 0.59mm/px · 3 of 93 slices shown]
[im 19/93  lung]
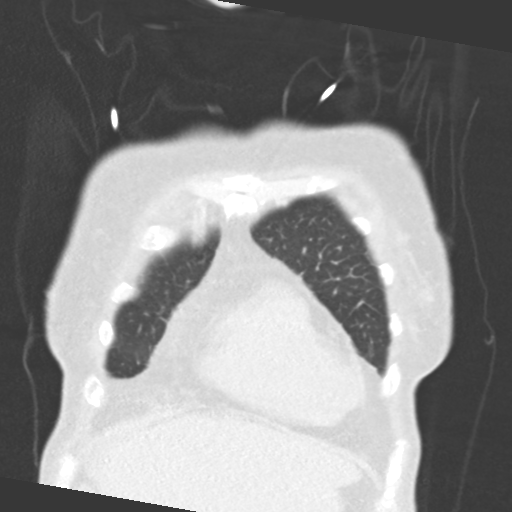
[im 37/93  lung]
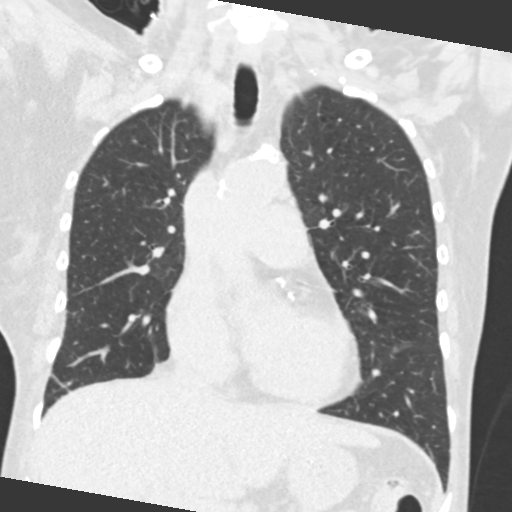
[im 56/93  lung]
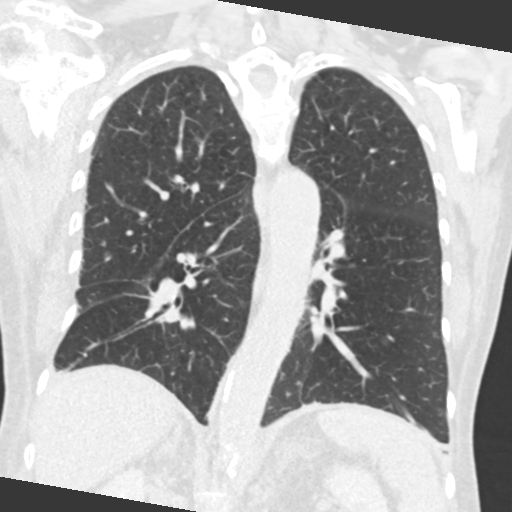

[15 of 36 positions shown; findings below may reference images not displayed]

FINDINGS: Cardiovascular: The heart size is normal. Dense coronary artery
calcifications are present. Atherosclerotic calcifications are
present at the aortic arch and origins the great vessels.

Mediastinum/Nodes: No significant mediastinal axillary, or hilar
adenopathy is present.

Lungs/Pleura: The lung fields are clear. There is no nodule or mass
lesion. The nodular density corresponds with the anterior right rib.
Bibasilar airspace disease is present, right greater than left. No
effusions are present. There is no pneumothorax.

Upper Abdomen: IV contrast is noted within the renal collecting
systems. Limited imaging of the upper abdomen is otherwise
unremarkable.

Musculoskeletal: Leftward curvature is present in the upper thoracic
spine. Vertebral body heights are maintained. Focal lytic or blastic
lesions are present.
IMPRESSION: 1. The nodular density on chest x-ray corresponds with the anterior
right rib.
2. Bibasilar airspace disease, right greater than left. While this
may represent atelectasis, infection is not excluded.
3. Coronary artery disease.
4. Aortic Atherosclerosis ([id number]).

## 2021-09-13 IMAGING — MR MR ABDOMEN WO/W CM MRCP
19 of 22 series · 43 of 48 positions shown · IV contrast (Gadavist)
Comparison: Abdomen/pelvis CT 12/23/2019. Abdominal ultrasound
12/23/2019.

CLINICAL DATA: Gallstones with biliary dilatation.



[Series 4: cor haste · coronal · 6.0mm · 1.19mm/px · 1 of 36 slices shown]
[im 1/36]
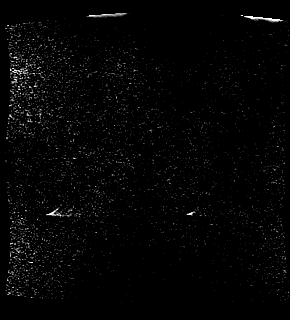

[Series 5: ax haste · axial · 6.0mm · 1.12mm/px · 1 of 41 slices shown]
[im 1/41]
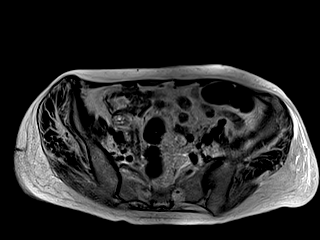

[Series 6: T2 fat-sat · axial · 6.0mm · 1.12mm/px · 1 of 41 slices shown]
[im 1/41]
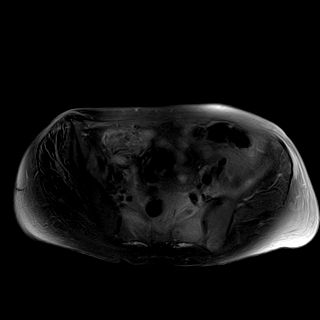

[Series 7: ax in and · axial · 3.0mm · 1.12mm/px · z∈[-118,+143]mm · 2 of 88 slices shown (1 of 2)]
[im 1/88]
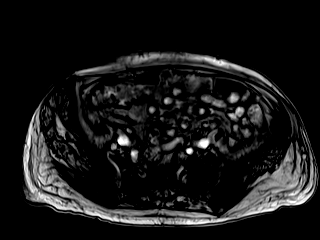
[im 88/88]
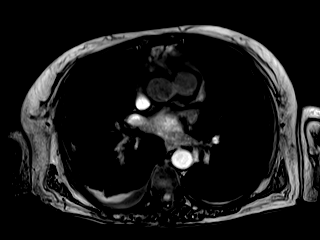

[Series 7: ax in and · axial · 3.0mm · 1.12mm/px · z∈[-118,+143]mm · 2 of 88 slices shown (2 of 2)]
[im 1/88]
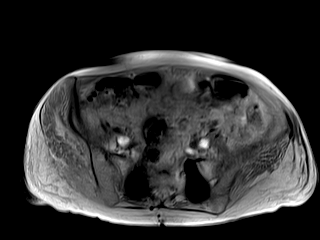
[im 88/88]
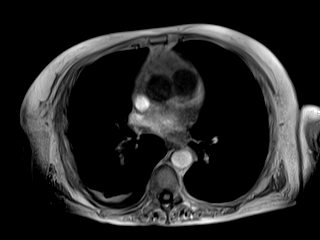

[Series 8: DWI · axial · 6.0mm · 1.34mm/px · z∈[-137,+180]mm · 3 of 135 slices shown (1 of 2)]
[im 1/135]
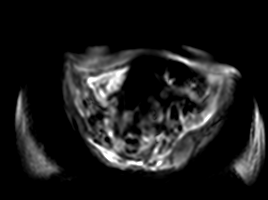
[im 68/135]
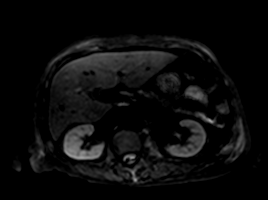
[im 135/135]
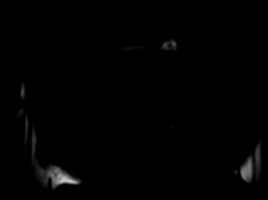

[Series 9: DWI · axial · 6.0mm · 1.34mm/px · 1 of 45 slices shown (2 of 2)]
[im 1/45]
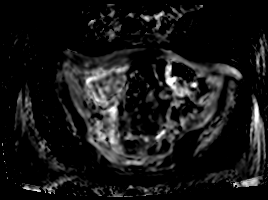

[Series 12: MRCP · coronal · 4.0mm · 1.12mm/px · 1 of 17 slices shown]
[im 1/17]
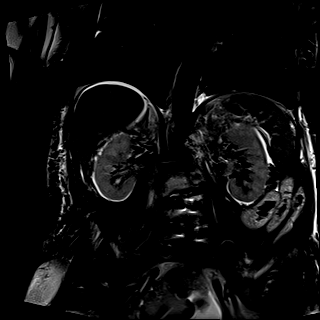

[Series 13: radials · coronal · 50.0mm · 0.78mm/px · 1 of 5 slices shown]
[im 1/5]
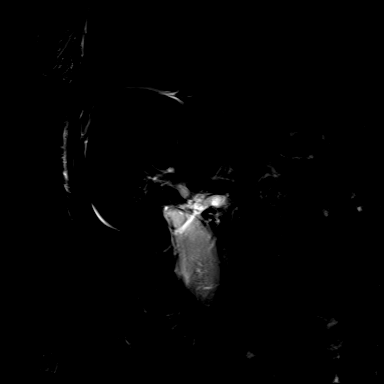

[Series 14: T1 dynamic · axial · non-contrast · 3.0mm · 1.12mm/px · z∈[-118,+143]mm · 3 of 88 slices shown (1 of 5)]
[im 1/88]
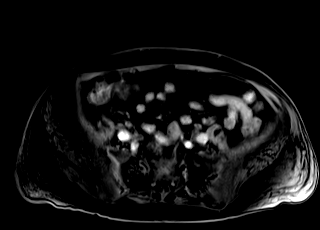
[im 44/88]
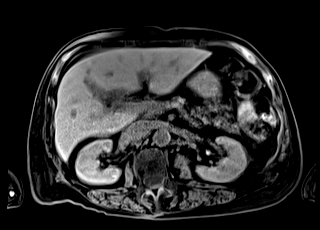
[im 88/88]
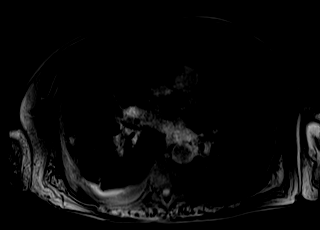

[Series 16: T1 dynamic post-contrast · axial · 3.0mm · 1.12mm/px · z∈[-118,+143]mm · 3 of 88 slices shown (1 of 5)]
[im 1/88]
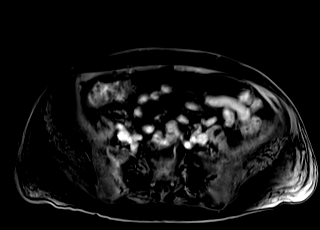
[im 44/88]
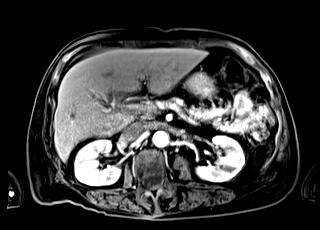
[im 88/88]
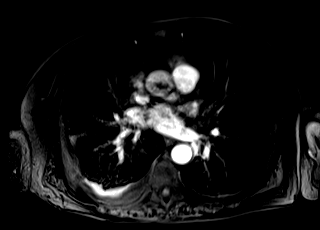

[Series 17: T1 dynamic · axial · 3.0mm · 1.12mm/px · z∈[-118,+143]mm · 3 of 88 slices shown (2 of 5)]
[im 1/88]
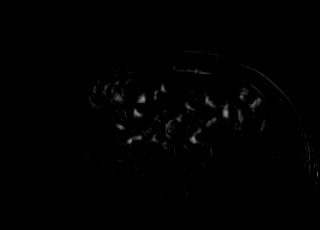
[im 44/88]
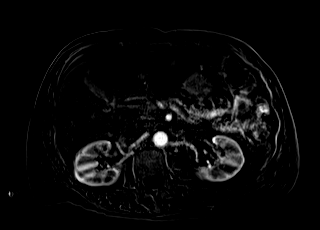
[im 88/88]
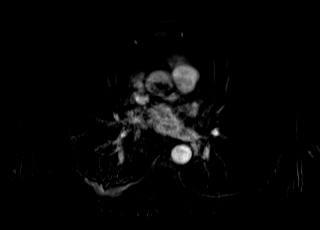

[Series 18: T1 dynamic post-contrast · axial · 3.0mm · 1.12mm/px · z∈[-118,+143]mm · 3 of 88 slices shown (2 of 5)]
[im 1/88]
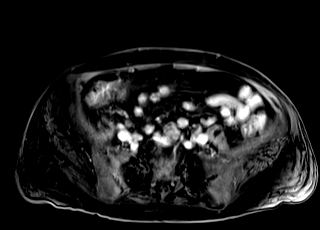
[im 44/88]
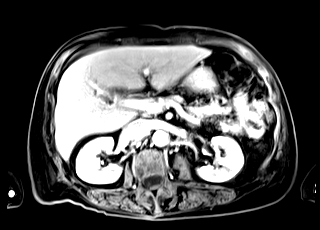
[im 88/88]
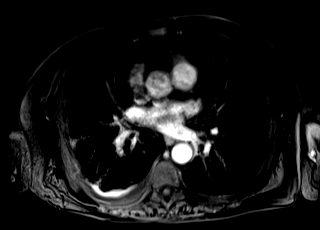

[Series 19: T1 dynamic · axial · 3.0mm · 1.12mm/px · z∈[-118,+143]mm · 3 of 88 slices shown (3 of 5)]
[im 1/88]
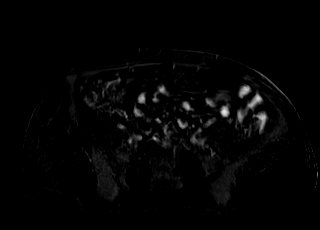
[im 44/88]
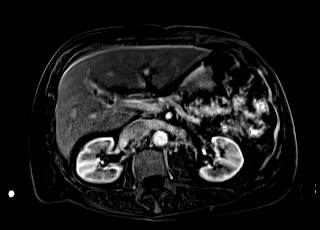
[im 88/88]
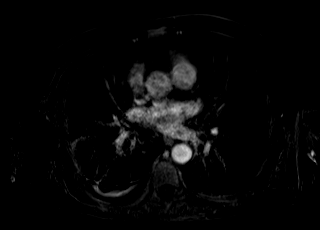

[Series 20: T1 dynamic post-contrast · axial · 3.0mm · 1.12mm/px · z∈[-118,+143]mm · 3 of 88 slices shown (3 of 5)]
[im 1/88]
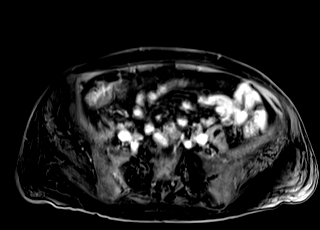
[im 44/88]
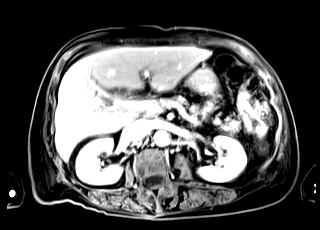
[im 88/88]
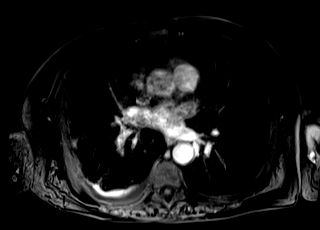

[Series 21: T1 dynamic · axial · 3.0mm · 1.12mm/px · z∈[-118,+143]mm · 3 of 88 slices shown (4 of 5)]
[im 1/88]
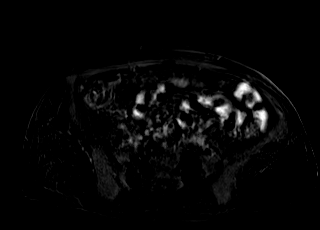
[im 44/88]
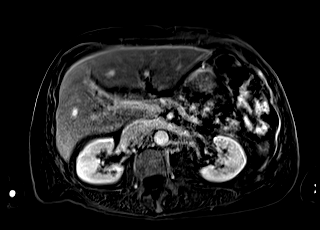
[im 88/88]
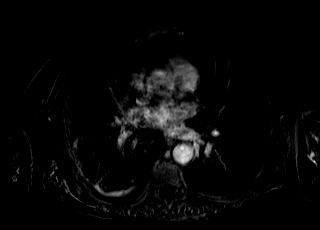

[Series 22: T1 dynamic post-contrast · axial · 3.0mm · 1.12mm/px · z∈[-118,+143]mm · 3 of 88 slices shown (4 of 5)]
[im 1/88]
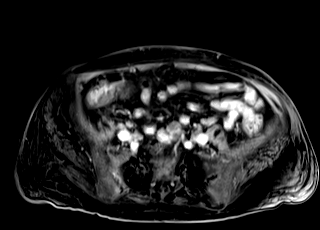
[im 44/88]
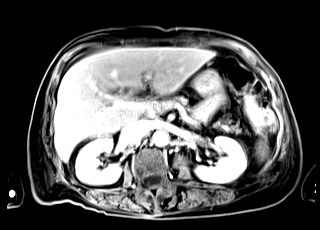
[im 88/88]
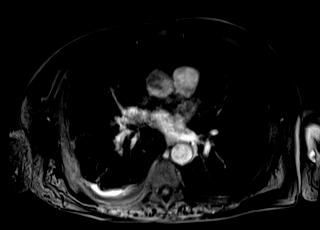

[Series 23: T1 dynamic · axial · 3.0mm · 1.12mm/px · z∈[-118,+143]mm · 3 of 88 slices shown (5 of 5)]
[im 1/88]
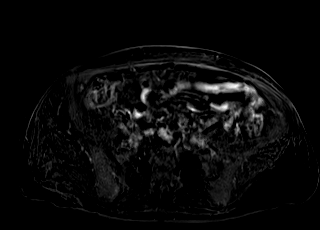
[im 44/88]
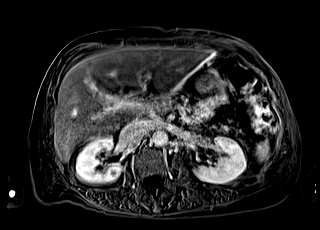
[im 88/88]
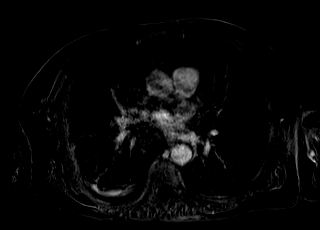

[Series 24: T1 dynamic post-contrast · coronal · 3.0mm · 1.56mm/px · 3 of 80 slices shown (5 of 5)]
[im 1/80]
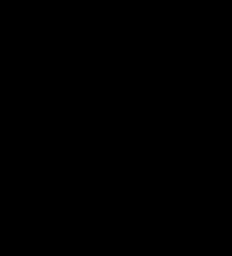
[im 40/80]
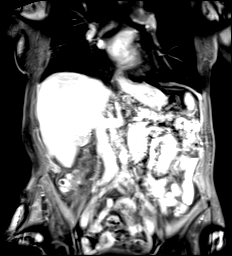
[im 80/80]
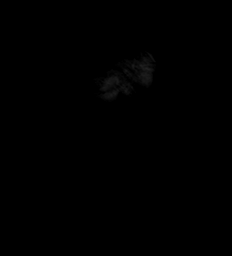

[43 of 48 positions shown; findings below may reference images not displayed]

FINDINGS: Lower chest: Dependent atelectasis with tiny bilateral effusions.

Hepatobiliary: Tiny cyst noted in the anterior left liver. No
suspicious enhancing liver abnormality. Gallbladder is distended
with small volume pericholecystic fluid. Several tiny/granular
stones are visualized in the gallbladder lumen (image 12 coronal T2
series 12). Common bile duct in the head of pancreas measures up to
8-9 mm diameter with some dependent sludge and probable tiny
dependent stones (coronal T2 image 9 of series 12). Common bile duct
inserts low on the descending duodenum.

Pancreas: Pancreas divisum ductal anatomy. No evidence for
pancreatic mass lesion although assessment on postcontrast imaging
is motion degraded.

Spleen:  No splenomegaly. No focal mass lesion.

Adrenals/Urinary Tract: No adrenal nodule or mass. Tiny T2
hyperintense lesions in the cortex of both kidney show no
appreciable enhancement after IV contrast administration, but
assessment on postcontrast imaging is markedly motion degraded.

Stomach/Bowel: Stomach is unremarkable. No gastric wall thickening.
No evidence of outlet obstruction. Duodenum is normally positioned
as is the ligament of Treitz. No small bowel or colonic dilatation
within the visualized abdomen. Wall thickening in the transverse
colon described on the recent CT scan is not well demonstrated by
MR.

Vascular/Lymphatic: No abdominal aortic aneurysm. No abdominal
lymphadenopathy.

Other: Trace perihepatic fluid with trace fluid in the gallbladder
fossa. Body wall edema noted.

Musculoskeletal: Status post lumbosacral fusion. No suspicious
marrow enhancement within the visualized bony anatomy.
IMPRESSION: 1. Cholelithiasis with small volume pericholecystic fluid. Mild
intra and extrahepatic biliary duct dilatation is associated with
several apparent tiny granular stones in the common bile duct.
2. Pancreas divisum anatomy with major papilla low on the descending
duodenum.
3. Probable tiny bilateral renal cysts incompletely characterized
due to substantial motion degradation on postcontrast imaging today.

## 2021-09-14 IMAGING — DX DG CHEST 1V PORT
1 series · 1 of 1 positions shown · non-contrast
Comparison: CT chest 12/23/2019, chest radiograph 12/23/2019

CLINICAL DATA: Acute respiratory failure, post ERCP, BIPAP

EXAM:
PORTABLE CHEST 1 VIEW

[chest ap]
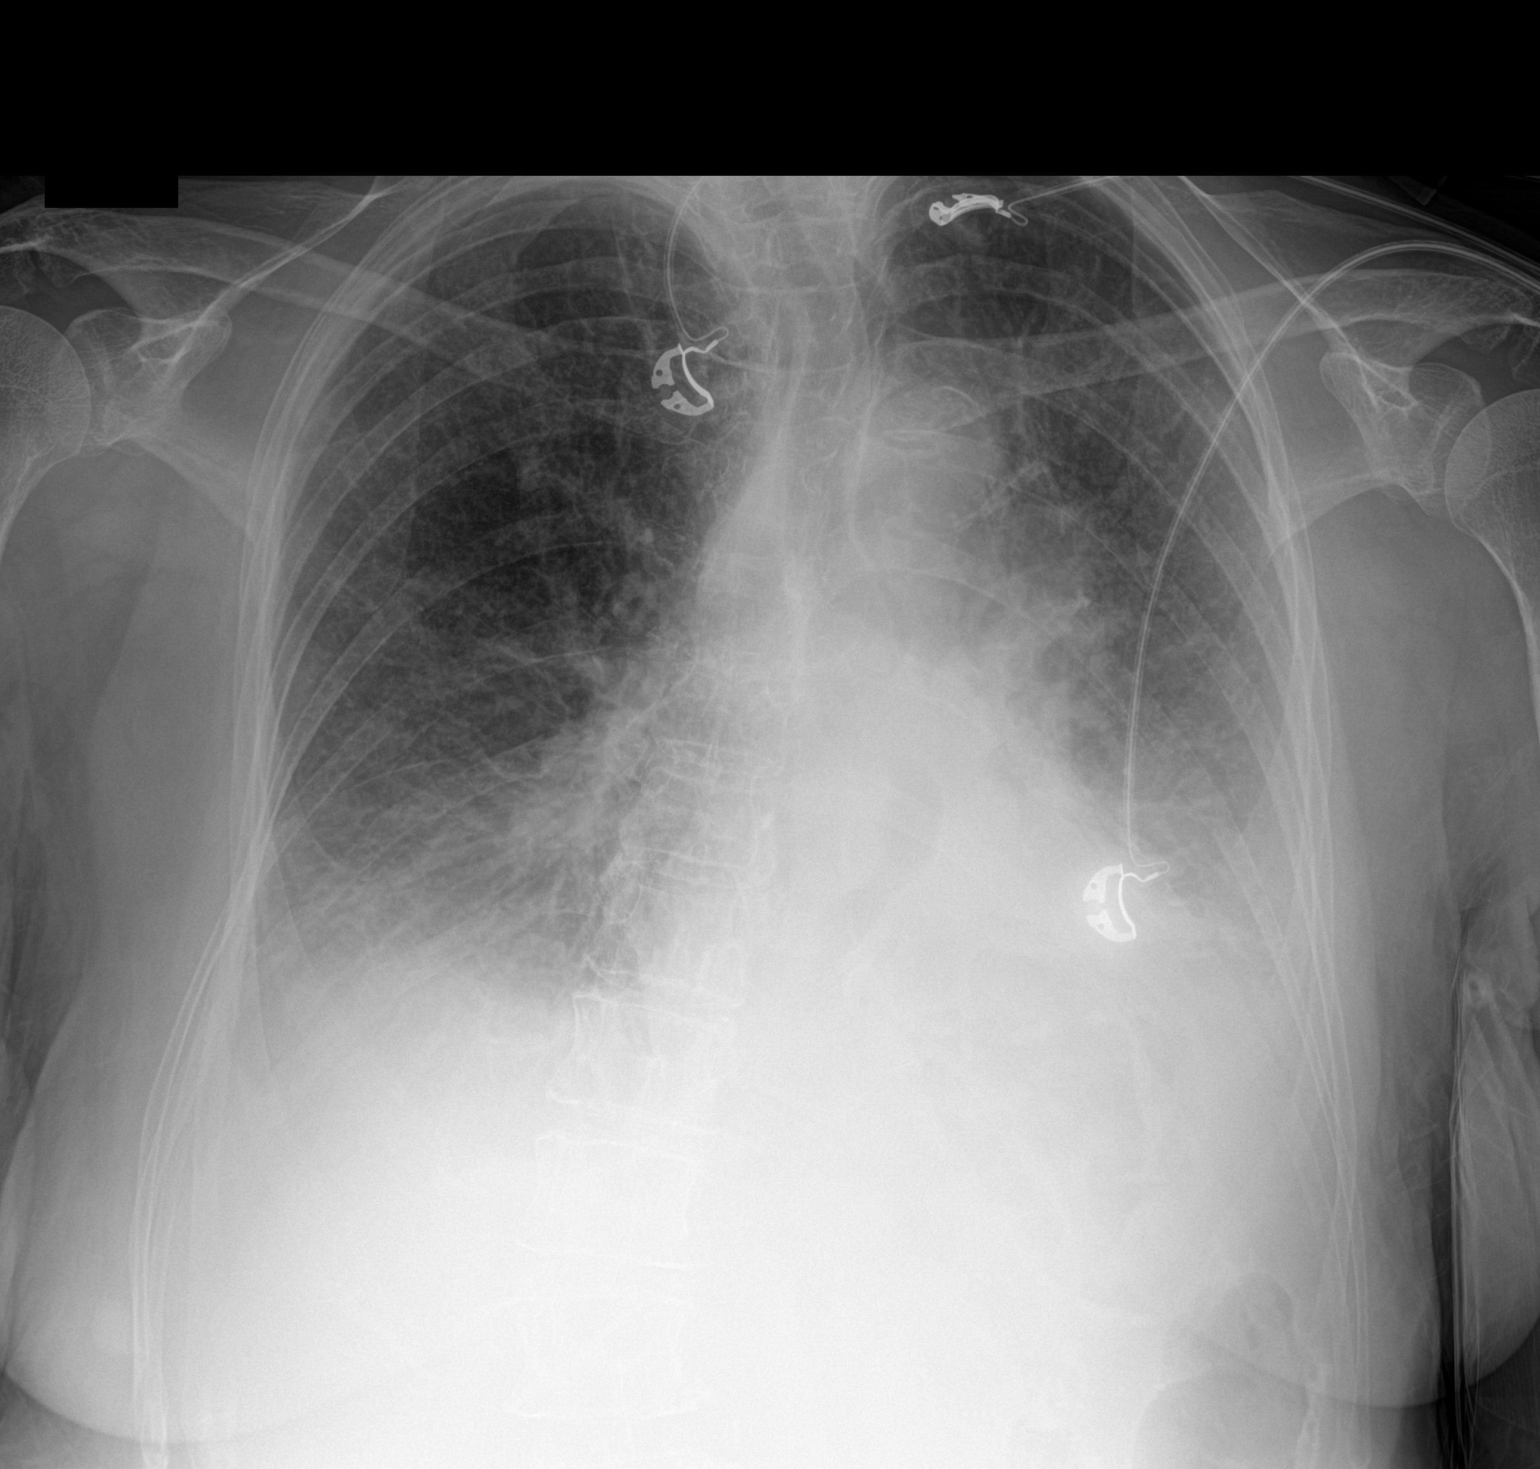

[1 of 1 positions shown; findings below may reference images not displayed]

FINDINGS: Heart size at the upper limits of normal, unchanged. Aortic
atherosclerosis. Significantly increased from prior examinations,
there is bibasilar opacity consistent with atelectasis and/or
pneumonia with possible small effusions. No evidence of
pneumothorax. Upper thoracic levocurvature. Overlying cardiac
monitoring leads.
IMPRESSION: Increasing bibasilar opacities consistent with atelectasis and/or
pneumonia with possible small effusions.

## 2021-09-14 IMAGING — RF DG ERCP WO/W SPHINCTEROTOMY
1 series · 8 of 8 positions shown · non-contrast
Comparison: MRCP-12/24/2019

CLINICAL DATA: ERCP for choledocholithiasis.

EXAM:
ERCP
TECHNIQUE: Multiple spot images obtained with the fluoroscopic device and
submitted for interpretation post-procedure.
FLUOROSCOPY TIME:  5 minutes, 39 seconds

[Series 1: unknown protocol · 0.20mm/px · 8 of 8 slices shown]
[im 1/8]
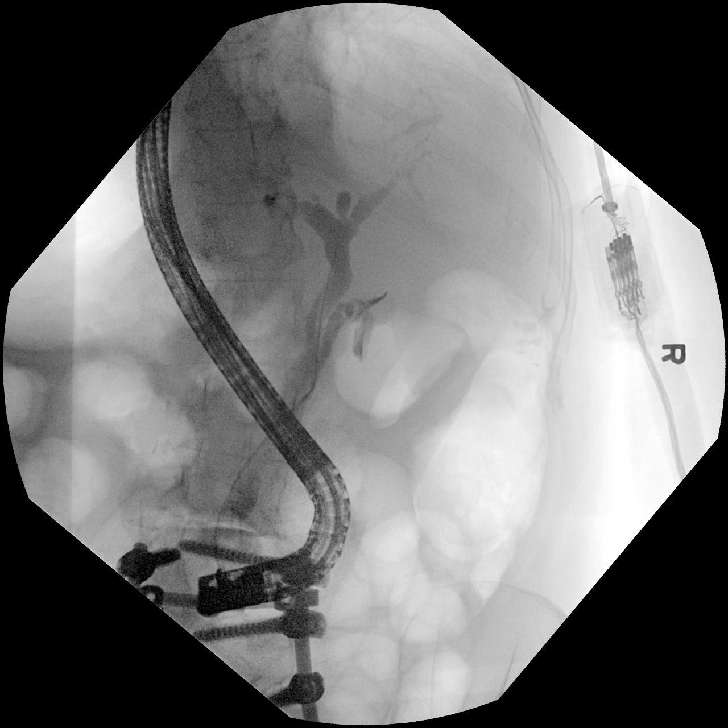
[im 2/8]
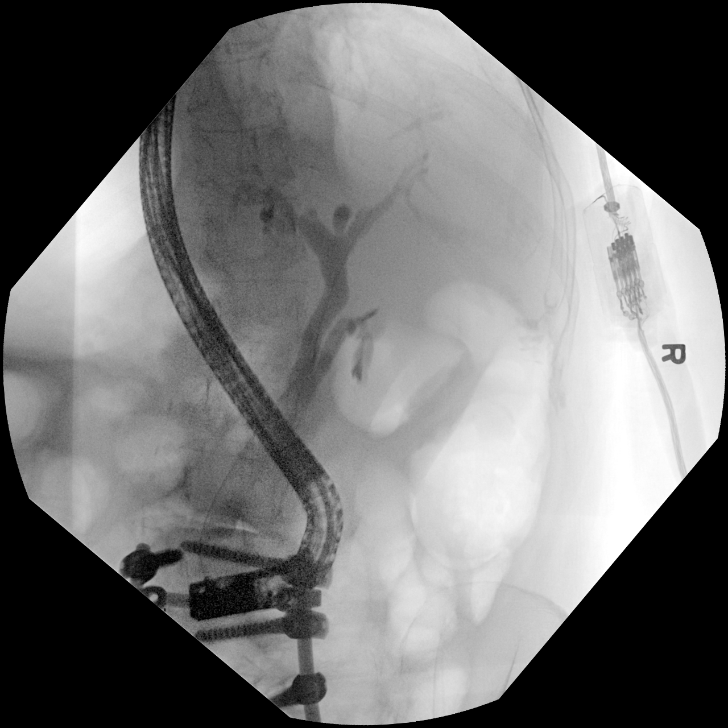
[im 3/8]
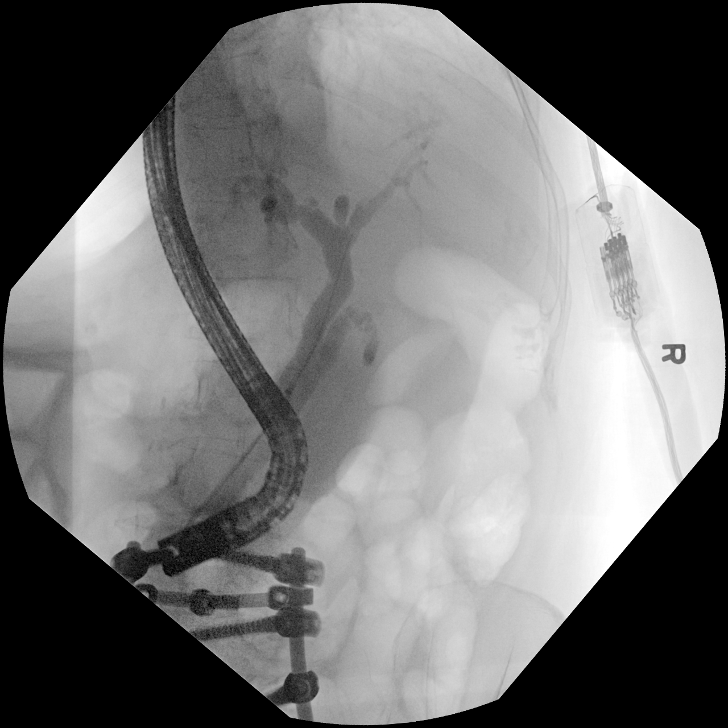
[im 4/8]
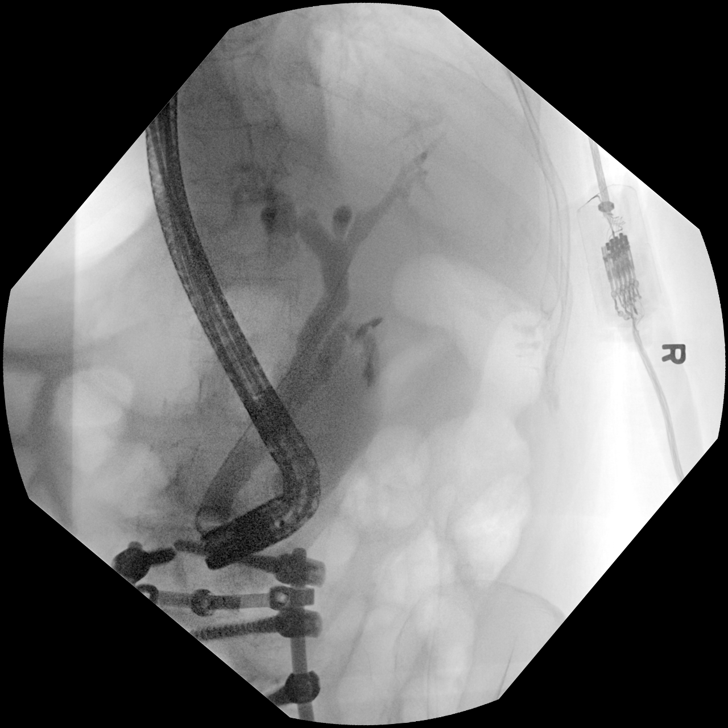
[im 5/8]
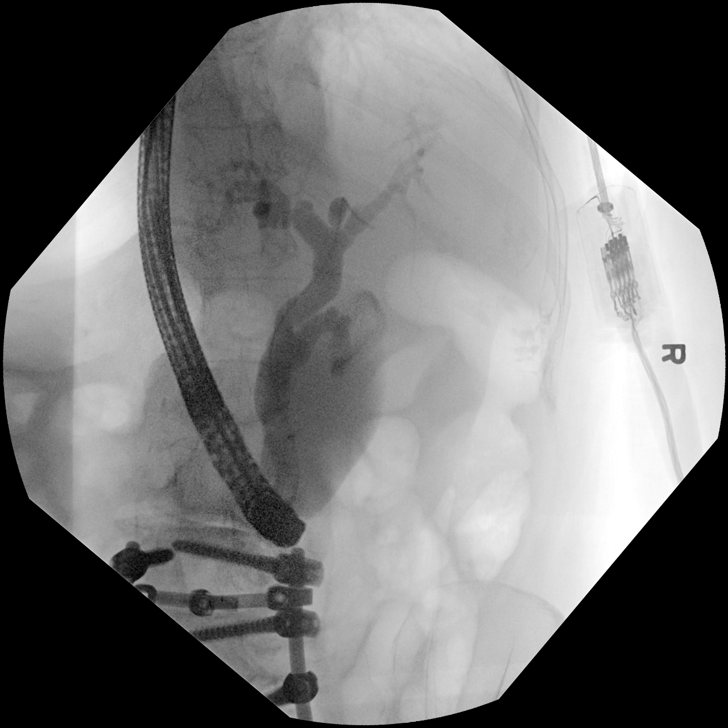
[im 6/8]
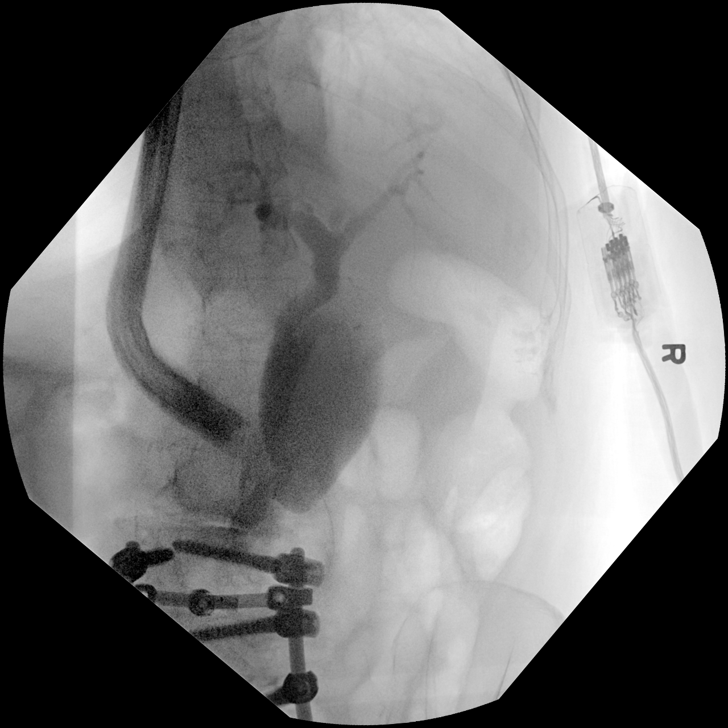
[im 7/8]
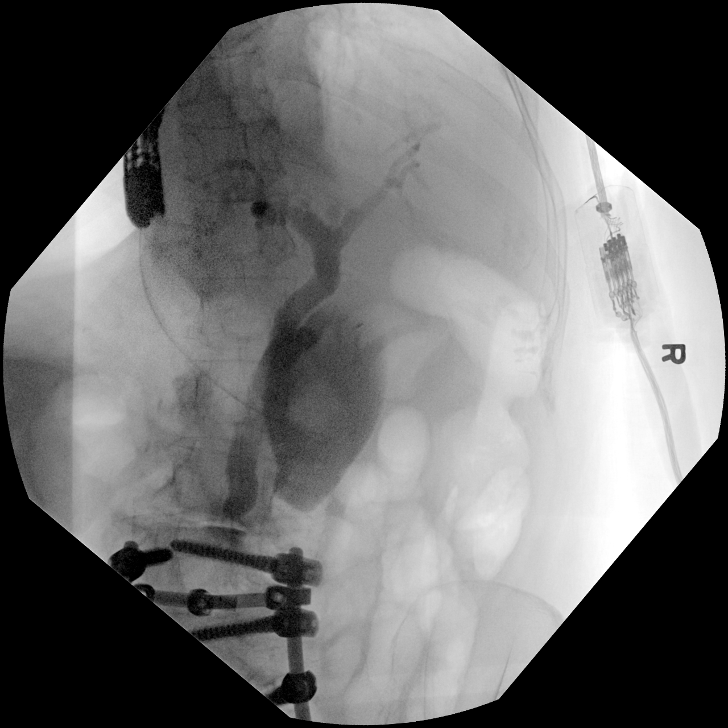
[im 8/8]
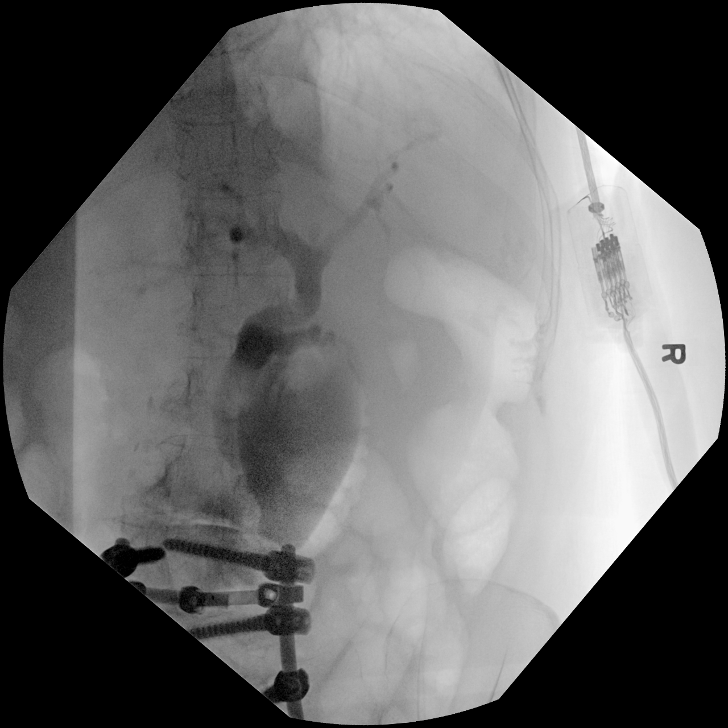

[8 of 8 positions shown; findings below may reference images not displayed]

FINDINGS: Eight spot intraoperative fluoroscopic images of the right upper
abdominal quadrant during ERCP are provided for review

Initial image demonstrates an ERCP probe overlying the right upper
abdominal quadrant with selective cannulation opacification of the
common bile duct which appears mildly dilated.

Subsequent images demonstrate insufflation of a balloon within the
mid aspect of the CBD with subsequent biliary sweeping and presumed
sphincterotomy.

There is passage of contrast through the cystic duct with
opacification of the gallbladder.

There is minimal opacification of the intrahepatic biliary tree
which appears mildly dilated.
IMPRESSION: ERCP with biliary sweeping and presumed sphincterotomy as above.

These images were submitted for radiologic interpretation only.
Please see the procedural report for the amount of contrast and the
fluoroscopy time utilized.

## 2021-09-19 DIAGNOSIS — M545 Low back pain, unspecified: Secondary | ICD-10-CM | POA: Diagnosis not present

## 2021-09-19 DIAGNOSIS — R69 Illness, unspecified: Secondary | ICD-10-CM | POA: Diagnosis not present

## 2021-09-19 DIAGNOSIS — Z0000 Encounter for general adult medical examination without abnormal findings: Secondary | ICD-10-CM | POA: Diagnosis not present

## 2021-09-19 DIAGNOSIS — Z79899 Other long term (current) drug therapy: Secondary | ICD-10-CM | POA: Diagnosis not present

## 2021-09-19 DIAGNOSIS — G8929 Other chronic pain: Secondary | ICD-10-CM | POA: Diagnosis not present

## 2021-09-19 IMAGING — DX DG CHEST 1V PORT
1 series · 1 of 1 positions shown · non-contrast
Comparison: 12/25/2019

CLINICAL DATA: Fever, emesis. Acute respiratory failure and
hypoxia.

EXAM:
PORTABLE CHEST 1 VIEW

[chest ap]
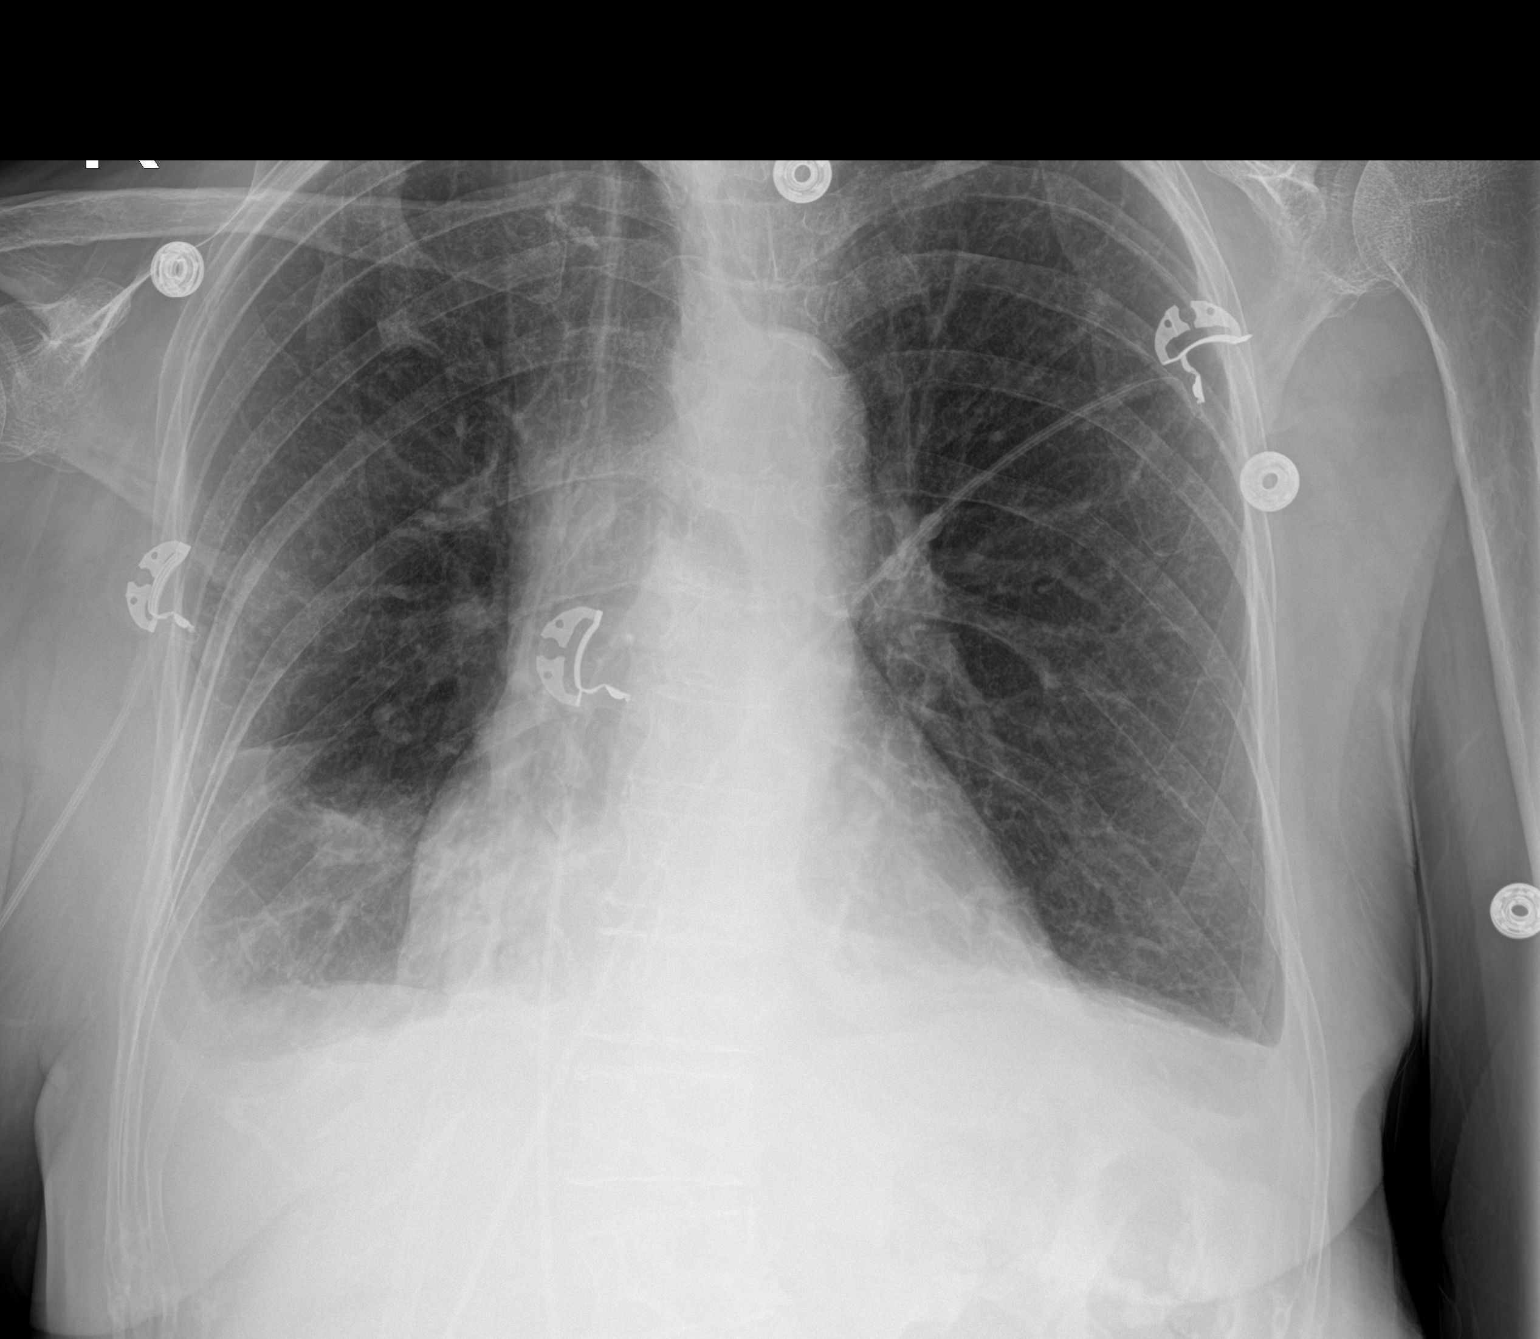

[1 of 1 positions shown; findings below may reference images not displayed]

FINDINGS: Mild cardiac enlargement. Moderate bilateral pleural effusions are
identified. No interstitial edema. Subsegmental atelectasis
identified in the right lower lobe.
IMPRESSION: 1. Moderate bilateral pleural effusions.
2. Right lower lobe subsegmental atelectasis.

## 2021-10-03 DIAGNOSIS — I509 Heart failure, unspecified: Secondary | ICD-10-CM | POA: Diagnosis not present

## 2021-10-07 ENCOUNTER — Telehealth: Payer: Self-pay | Admitting: Internal Medicine

## 2021-10-07 NOTE — Telephone Encounter (Signed)
Our next available was mid November and she didn't want to wait that long. I have put her on to see Alonza Bogus, PA-C for 10/21/2021 at 2:00pm.

## 2021-10-07 NOTE — Telephone Encounter (Signed)
Pt states that budisonide is not working for her anymore so she would like another alternative. She uses [employer].

## 2021-10-07 NOTE — Telephone Encounter (Signed)
The budesonide has gone up to $240 a month with [employer]. She said it's quit working, she is having abdominal pain beneath her belly button on and off, nausea, no fever, diarrhea on average 3 times a day. Please advise Sir? Thank you.

## 2021-10-07 NOTE — Telephone Encounter (Signed)
This will need to be figured out there an office visit, next available follow-up

## 2021-10-09 ENCOUNTER — Other Ambulatory Visit: Payer: Self-pay | Admitting: Cardiovascular Disease

## 2021-10-09 DIAGNOSIS — I509 Heart failure, unspecified: Secondary | ICD-10-CM | POA: Diagnosis not present

## 2021-10-16 ENCOUNTER — Ambulatory Visit (INDEPENDENT_AMBULATORY_CARE_PROVIDER_SITE_OTHER): Payer: Medicare HMO

## 2021-10-16 ENCOUNTER — Encounter: Payer: Self-pay | Admitting: Internal Medicine

## 2021-10-16 ENCOUNTER — Ambulatory Visit (INDEPENDENT_AMBULATORY_CARE_PROVIDER_SITE_OTHER): Payer: Medicare HMO | Admitting: Internal Medicine

## 2021-10-16 ENCOUNTER — Other Ambulatory Visit: Payer: Self-pay

## 2021-10-16 VITALS — BP 144/72 | HR 95 | Temp 98.4°F | Ht 63.0 in | Wt 108.0 lb

## 2021-10-16 DIAGNOSIS — J449 Chronic obstructive pulmonary disease, unspecified: Secondary | ICD-10-CM | POA: Diagnosis not present

## 2021-10-16 DIAGNOSIS — R739 Hyperglycemia, unspecified: Secondary | ICD-10-CM

## 2021-10-16 DIAGNOSIS — J441 Chronic obstructive pulmonary disease with (acute) exacerbation: Secondary | ICD-10-CM

## 2021-10-16 DIAGNOSIS — R059 Cough, unspecified: Secondary | ICD-10-CM | POA: Diagnosis not present

## 2021-10-16 DIAGNOSIS — R051 Acute cough: Secondary | ICD-10-CM | POA: Diagnosis not present

## 2021-10-16 DIAGNOSIS — J479 Bronchiectasis, uncomplicated: Secondary | ICD-10-CM | POA: Diagnosis not present

## 2021-10-16 DIAGNOSIS — J439 Emphysema, unspecified: Secondary | ICD-10-CM | POA: Diagnosis not present

## 2021-10-16 DIAGNOSIS — F17210 Nicotine dependence, cigarettes, uncomplicated: Secondary | ICD-10-CM | POA: Diagnosis not present

## 2021-10-16 DIAGNOSIS — R69 Illness, unspecified: Secondary | ICD-10-CM | POA: Diagnosis not present

## 2021-10-16 MED ORDER — DOXYCYCLINE HYCLATE 100 MG PO TABS
100.0000 mg | ORAL_TABLET | Freq: Two times a day (BID) | ORAL | 0 refills | Status: DC
Start: 2021-10-16 — End: 2021-11-03

## 2021-10-16 MED ORDER — HYDROCODONE BIT-HOMATROP MBR 5-1.5 MG/5ML PO SOLN: 5.0000 mL | Freq: Four times a day (QID) | ORAL | 0 refills | Status: AC | PRN

## 2021-10-16 MED ORDER — PREDNISONE 10 MG PO TABS: ORAL_TABLET | ORAL | 0 refills | Status: DC

## 2021-10-16 NOTE — Assessment & Plan Note (Signed)
With mild wheezing, for predpac asd,  to f/u any worsening symptoms or concerns

## 2021-10-16 NOTE — Progress Notes (Signed)
Patient ID: Denise King, female   DOB: 21-Dec-1951, 70 y.o.   MRN: [id number]        Chief Complaint: follow up cough and wheezing       HPI:  Denise King is a 70 y.o. female Here with acute onset mild to mod 2-3 days ST, HA, general weakness and malaise, with prod cough greenish sputum, but Pt denies chest pain, increased sob or doe, wheezing, orthopnea, PND, increased LE swelling, palpitations, dizziness or syncope, except for mild wheezing in last 1 day.   Pt denies polydipsia, polyuria, or new focal neuro s/s.   Pt denies further wt loss, night sweats, loss of appetite, or other constitutional symptoms        Wt Readings from Last 3 Encounters:  10/16/21 108 lb (49 kg)  07/15/21 111 lb (50.3 kg)  07/03/21 100 lb 12.8 oz (45.7 kg)   BP Readings from Last 3 Encounters:  10/16/21 (!) 144/72  07/15/21 120/60  07/03/21 128/86         Past Medical History:  Diagnosis Date   Acute cystitis    Acute respiratory failure ([place])    Allergy    as a child grew out of them   Anemia    pernicious anemia   Anxiety    Arthritis    Back pain    Blood transfusion    CAP (community acquired pneumonia)    CHF (congestive heart failure) (HCC)    Chronic pain syndrome    Common bile duct stone    Depression    Diverticulosis of colon    Duodenal ulcer hemorrhagic-after biliary sphincterotomy    Dysthymia    Esophagitis    EtOH dependence ([place])    Gastritis    GERD (gastroesophageal reflux disease)    H/O chest pain Dec. 2013   no work up done   Hx of colonic polyps    Hypertension    Irritable bowel syndrome    Lymphocytic colitis 02/16/2020   Osteopenia    Osteoporosis    Pneumonia 2019   Polypharmacy    Reflux esophagitis    Right sided sciatica 12/22/2017   Tobacco use disorder    Unspecified chronic bronchitis (HCC)    Vertigo    Vitamin B12 deficiency    Wears glasses    Past Surgical History:  Procedure Laterality Date   APPENDECTOMY     BACK SURGERY   10-09   Dr. Patrice Paradise   BIOPSY  02/14/2020   Procedure: BIOPSY;  Surgeon: Gatha Mayer, MD;  Location: [facility];  Service: Endoscopy;;   CARPAL TUNNEL RELEASE Right 08/14/2014   Procedure: RIGHT CARPAL TUNNEL RELEASE AND INJECT LEFT THUMB;  Surgeon: Daryll Brod, MD;  Location: [facility];  Service: Orthopedics;  Laterality: Right;   CHOLECYSTECTOMY N/A 02/22/2020   Procedure: LAPAROSCOPIC CHOLECYSTECTOMY WITH INTRAOPERATIVE CHOLANGIOGRAM;  Surgeon: Kieth Brightly Arta Bruce, MD;  Location: [facility];  Service: General;  Laterality: N/A;   COLONOSCOPY     COLONOSCOPY WITH PROPOFOL N/A 02/14/2020   Procedure: COLONOSCOPY WITH PROPOFOL;  Surgeon: Gatha Mayer, MD;  Location: [facility];  Service: Endoscopy;  Laterality: N/A;   ENDOSCOPIC RETROGRADE CHOLANGIOPANCREATOGRAPHY (ERCP) WITH PROPOFOL N/A 12/25/2019   Procedure: ENDOSCOPIC RETROGRADE CHOLANGIOPANCREATOGRAPHY (ERCP) WITH PROPOFOL;  Surgeon: Gatha Mayer, MD;  Location: [facility];  Service: Gastroenterology;  Laterality: N/A;   ESOPHAGOGASTRODUODENOSCOPY (EGD) WITH PROPOFOL N/A 01/06/2020   Procedure: ESOPHAGOGASTRODUODENOSCOPY (EGD) WITH PROPOFOL;  Surgeon: Irene Shipper, MD;  Location:  [facility];  Service: Endoscopy;  Laterality: N/A;   HOT HEMOSTASIS N/A 01/06/2020   Procedure: HOT HEMOSTASIS (ARGON PLASMA COAGULATION/BICAP);  Surgeon: Irene Shipper, MD;  Location: [facility];  Service: Endoscopy;  Laterality: N/A;   INTRAMEDULLARY (IM) NAIL INTERTROCHANTERIC Right 10/01/2018   Procedure: INTRAMEDULLARY (IM) NAIL INTERTROCHANTRIC;  Surgeon: Thornton Park, MD;  Location: [facility];  Service: Orthopedics;  Laterality: Right;   LAPAROSCOPY N/A 02/21/2013   Procedure: LAPAROSCOPY OPERATIVE;  Surgeon: Margarette Asal, MD;  Location: [facility];  Service: Gynecology;  Laterality: N/A;  REQUESTING 5MM SCOPE WITH CAMERA   LEFT HEART CATH AND CORONARY ANGIOGRAPHY N/A 05/11/2019   Procedure: LEFT HEART CATH AND CORONARY ANGIOGRAPHY;   Surgeon: Sherren Mocha, MD;  Location: [facility];  Service: Cardiovascular;  Laterality: N/A;   REMOVAL OF STONES  12/25/2019   Procedure: Karlyn Agee;  Surgeon: Gatha Mayer, MD;  Location[facility]COPY;  Service: Gastroenterology;;   Clide Deutscher  01/06/2020   Procedure: Clide Deutscher;  Surgeon: Irene Shipper, MD;  Location: [facility]COPY;  Service: Endoscopy;;   SPHINCTEROTOMY  12/25/2019   Procedure: Joan Mayans;  Surgeon: Gatha Mayer, MD;  Location: [facility];  Service: Gastroenterology;;   TONSILLECTOMY     TUBAL LIGATION     UPPER GASTROINTESTINAL ENDOSCOPY      reports that she has been smoking cigarettes. She has a 30.00 pack-year smoking history. She has never used smokeless tobacco. She reports current alcohol use of about 3.0 standard drinks per week. She reports that she does not use drugs. family history includes Alcohol abuse in her daughter; Bipolar disorder in her daughter; Colon cancer in her brother and father; Drug abuse in her daughter; Heart disease in her mother; Prostate cancer in her father; Skin cancer in her sister. Allergies  Allergen Reactions   Atorvastatin Nausea And Vomiting   Levofloxacin Other (See Comments)    Achilles tendon pain   Omnicef [Cefdinir] Diarrhea and Other (See Comments)    Uncontrollable diarrhea   Trazodone And Nefazodone Other (See Comments)    "Felt like I was going to faint"   Sulfa Antibiotics Rash   Sulfonamide Derivatives Rash   Current Outpatient Medications on File Prior to Visit  Medication Sig Dispense Refill   albuterol (VENTOLIN HFA) 108 (90 Base) MCG/ACT inhaler Inhale 1-2 puffs into the lungs every 6 (six) hours as needed for wheezing or shortness of breath.     budesonide (ENTOCORT EC) 3 MG 24 hr capsule TAKE 3 CAPSULES ONCE DAILY. 90 capsule 1   buPROPion (WELLBUTRIN SR) 150 MG 12 hr tablet Take 1 tablet (150 mg total) by mouth daily. 90 tablet 1   cyclobenzaprine (FLEXERIL) 5 MG tablet Take 1  tablet (5 mg total) by mouth 3 (three) times daily as needed for muscle spasms. 60 tablet 2   denosumab (PROLIA) 60 MG/ML SOSY injection Inject 60 mg into the skin every 6 (six) months.     diclofenac Sodium (VOLTAREN) 1 % GEL Apply 2 g topically 4 (four) times daily as needed (for joint pain).     DULoxetine (CYMBALTA) 30 MG capsule Take 30 mg by mouth daily.     hyoscyamine (ANASPAZ) 0.125 MG TBDP disintergrating tablet Place 1 tablet (0.125 mg total) under the tongue every 6 (six) hours as needed ([name] cramps). 90 tablet 3   Melatonin 10 MG TABS Take 10 mg by mouth at bedtime as needed (sleep).     naloxone (NARCAN) nasal spray 4 mg/0.1 mL Place 1  spray into the nose as needed (overdose).     nicotine (NICODERM CQ - DOSED IN MG/24 HOURS) 21 mg/24hr patch Place 1 patch (21 mg total) onto the skin daily. 28 patch 0   nitroGLYCERIN (NITROSTAT) 0.4 MG SL tablet Place 1 tablet (0.4 mg total) under the tongue every 5 (five) minutes as needed for chest pain. 25 tablet 3   ondansetron (ZOFRAN-ODT) 4 MG disintegrating tablet DISSOLVE 1 TABLET ON TONGUE EVERY 8 HOURS AS NEEDED FOR NAUSEA/VOMITING. 30 tablet 3   Oxycodone HCl 10 MG TABS Take 1 tablet (10 mg total) by mouth 2 (two) times daily as needed. 1 tablet 0   pantoprazole (PROTONIX) 40 MG tablet TAKE (1) TABLET TWICE A DAY BEFORE MEALS. 60 tablet 11   rosuvastatin (CRESTOR) 5 MG tablet Take 1 tablet (5 mg total) by mouth 3 (three) times a week. Please keep upcoming appt in January 2023 with Dr. Johnsie Cancel before anymore refills. Thank you Final Attempt 45 tablet 1   sertraline (ZOLOFT) 100 MG tablet Take 2 tablets (200 mg total) by mouth at bedtime. 180 tablet 1   spironolactone (ALDACTONE) 25 MG tablet Take 0.5 tablets (12.5 mg total) by mouth daily. 45 tablet 3   valACYclovir (VALTREX) 500 MG tablet Take 500 mg by mouth 2 (two) times daily as needed (flare ups).     potassium chloride (KLOR-CON) 10 MEQ tablet Take 1 tablet (10 mEq total) by mouth daily.  30 tablet 0   sucralfate (CARAFATE) 1 g tablet Take 1 tablet (1 g total) by mouth 4 (four) times daily -  with meals and at bedtime for 14 days. (Patient taking differently: Take 1 g by mouth in the morning and at bedtime.) 56 tablet 0   No current facility-administered medications on file prior to visit.        ROS:  All others reviewed and negative.  Objective        PE:  BP (!) 144/72 (BP Location: Right Arm, Patient Position: Sitting, Cuff Size: Normal)   Pulse 95   Temp 98.4 F (36.9 C) (Oral)   Ht 5\' 3"  (1.6 m)   Wt 108 lb (49 kg)   SpO2 96%   BMI 19.13 kg/m                 Constitutional: Pt appears in NAD               HENT: Head: NCAT.                Right Ear: External ear normal.                 Left Ear: External ear normal.                Eyes: . Pupils are equal, round, and reactive to light. Conjunctivae and EOM are normal               Nose: without d/c or deformity               Neck: Neck supple. Gross normal ROM               Cardiovascular: Normal rate and regular rhythm.                 Pulmonary/Chest: Effort normal and breath sounds without rales with mild bilat wheezing.                Abd:  Soft, NT, ND, + BS, no  organomegaly               Neurological: Pt is alert. At baseline orientation, motor grossly intact               Skin: Skin is warm. No rashes, no other new lesions, LE edema - none               Psychiatric: Pt behavior is normal without agitation   Micro: none  Cardiac tracings I have personally interpreted today:  none  Pertinent Radiological findings (summarize): none   Lab Results  Component Value Date   WBC 7.0 06/26/2021   HGB 13.0 06/26/2021   HCT 39.0 06/26/2021   PLT 322.0 06/26/2021   GLUCOSE 88 06/26/2021   CHOL 174 06/26/2021   TRIG 96.0 06/26/2021   HDL 73.10 06/26/2021   LDLDIRECT 102.4 05/24/2008   LDLCALC 81 06/26/2021   ALT 8 06/26/2021   AST 18 06/26/2021   NA 140 06/26/2021   K 4.4 06/26/2021   CL 104  06/26/2021   CREATININE 0.72 06/26/2021   BUN 6 06/26/2021   CO2 29 06/26/2021   TSH 1.05 06/26/2021   INR 1.0 02/09/2021   HGBA1C 5.2 10/01/2018   Assessment/Plan:  Denise King is a 70 y.o. White or Caucasian [1] female with  has a past medical history of Acute cystitis, Acute respiratory failure ([place]), Allergy, Anemia, Anxiety, Arthritis, Back pain, Blood transfusion, CAP (community acquired pneumonia), CHF (congestive heart failure) ([place]), Chronic pain syndrome, Common bile duct stone, Depression, Diverticulosis of colon, Duodenal ulcer hemorrhagic-after biliary sphincterotomy, Dysthymia, Esophagitis, EtOH dependence ([place]), Gastritis, GERD (gastroesophageal reflux disease), H/O chest pain (Dec. 2013), colonic polyps, Hypertension, Irritable bowel syndrome, Lymphocytic colitis (02/16/2020), Osteopenia, Osteoporosis, Pneumonia (2019), Polypharmacy, Reflux esophagitis, Right sided sciatica (12/22/2017), Tobacco use disorder, Unspecified chronic bronchitis ([place]), Vertigo, Vitamin B12 deficiency, and Wears glasses.  COPD (chronic obstructive pulmonary disease) (HCC) With mild wheezing, for predpac asd,  to f/u any worsening symptoms or concerns   Cigarette smoker Counseled to quit, pt not ready  Hyperglycemia Lab Results  Component Value Date   HGBA1C 5.2 10/01/2018   Stable, pt to continue current medical treatment  - diet  Cough Mild to mod, /w bronchitis vs pna, for antibx course, cough med prn, cxr,  to f/u any worsening symptoms or concerns  Followup: Return if symptoms worsen or fail to improve.  Cathlean Cower, MD 10/16/2021 9:15 PM [facility]

## 2021-10-16 NOTE — Assessment & Plan Note (Signed)
Counseled to quit, pt not ready 

## 2021-10-16 NOTE — Patient Instructions (Addendum)
Please take all new medication as prescribed - the antibiotic, cough medicine, and prednisone  Please continue all other medications as before  Please have the pharmacy call with any other refills you may need.  Please continue your efforts at being more active, low cholesterol diet,   Please keep your appointments with your specialists as you may have planned  Please go to the XRAY Department in the first floor for the x-ray testing  You will be contacted by phone if any changes need to be made immediately.  Otherwise, you will receive a letter about your results with an explanation, but please check with MyChart first.  Please remember to sign up for MyChart if you have not done so, as this will be important to you in the future with finding out test results, communicating by private email, and scheduling acute appointments online when needed.

## 2021-10-16 NOTE — Assessment & Plan Note (Signed)
Lab Results  Component Value Date   HGBA1C 5.2 10/01/2018   Stable, pt to continue current medical treatment  - diet

## 2021-10-16 NOTE — Assessment & Plan Note (Signed)
Mild to mod, /w bronchitis vs pna, for antibx course, cough med prn, cxr,  to f/u any worsening symptoms or concerns

## 2021-10-17 ENCOUNTER — Encounter: Payer: Self-pay | Admitting: Internal Medicine

## 2021-10-17 DIAGNOSIS — R69 Illness, unspecified: Secondary | ICD-10-CM | POA: Diagnosis not present

## 2021-10-17 DIAGNOSIS — R059 Cough, unspecified: Secondary | ICD-10-CM | POA: Diagnosis not present

## 2021-10-17 DIAGNOSIS — F17210 Nicotine dependence, cigarettes, uncomplicated: Secondary | ICD-10-CM | POA: Diagnosis not present

## 2021-10-17 DIAGNOSIS — Z681 Body mass index (BMI) 19 or less, adult: Secondary | ICD-10-CM | POA: Diagnosis not present

## 2021-10-17 DIAGNOSIS — G8929 Other chronic pain: Secondary | ICD-10-CM | POA: Diagnosis not present

## 2021-10-17 DIAGNOSIS — Z79899 Other long term (current) drug therapy: Secondary | ICD-10-CM | POA: Diagnosis not present

## 2021-10-17 DIAGNOSIS — M545 Low back pain, unspecified: Secondary | ICD-10-CM | POA: Diagnosis not present

## 2021-10-21 ENCOUNTER — Encounter: Payer: Self-pay | Admitting: Gastroenterology

## 2021-10-21 ENCOUNTER — Ambulatory Visit (INDEPENDENT_AMBULATORY_CARE_PROVIDER_SITE_OTHER): Payer: Medicare HMO | Admitting: Gastroenterology

## 2021-10-21 VITALS — BP 144/86 | HR 96 | Ht 61.0 in | Wt 107.2 lb

## 2021-10-21 DIAGNOSIS — R1013 Epigastric pain: Secondary | ICD-10-CM | POA: Diagnosis not present

## 2021-10-21 DIAGNOSIS — R112 Nausea with vomiting, unspecified: Secondary | ICD-10-CM | POA: Diagnosis not present

## 2021-10-21 DIAGNOSIS — K580 Irritable bowel syndrome with diarrhea: Secondary | ICD-10-CM | POA: Diagnosis not present

## 2021-10-21 DIAGNOSIS — K52832 Lymphocytic colitis: Secondary | ICD-10-CM

## 2021-10-21 MED ORDER — ONDANSETRON 4 MG PO TBDP: 4.0000 mg | ORAL_TABLET | Freq: Three times a day (TID) | ORAL | 0 refills | Status: AC

## 2021-10-21 NOTE — Progress Notes (Signed)
10/21/2021 ALOMA BOCH [id number] 1951-04-24   HISTORY OF PRESENT ILLNESS: This is a 70 year old female is a patient of Dr. Celesta Aver.  She follows here for treatment of her lymphocytic colitis.  Has been on budesonide 3 mg, 3 pills daily.  Recently she feels that it has not been as effective and is now very expensive as she is in the "donut hole" with her insurance.  She is now on prednisone and doxycycline since last Thursday as it was prescribed by her PCP for bronchitis.  Since she has been on the prednisone her diarrhea has about resolved for the past few days.  Since beginning these medications, however, she has now been having nausea and vomiting for the past 3 to 4 days.  The doxycycline was prescribed for 10 days.  She is on her pantoprazole 40 mg twice daily.  She also describes an epigastric abdominal pain, hard to know if it is more acute or if its been an ongoing issue.  She tried cholestyramine previously for her diarrhea, but says that she had a hard time tolerating it as she tried to mix it with liquid.  She mentions a few times while she is here that she is under a lot of stress as she is the caregiver for her husband whose health has been declining.  She also cares for her daughter who suffers from bipolar.  Her daughter resides with them as well and has been refusing to take her medications a lot of times, is up all night so the patient has been unable to sleep as she has to keep a watchful eye on her daughter.    Past Medical History:  Diagnosis Date   Acute cystitis    Acute respiratory failure ([place])    Allergy    as a child grew out of them   Anemia    pernicious anemia   Anxiety    Arthritis    Back pain    Blood transfusion    CAP (community acquired pneumonia)    CHF (congestive heart failure) (HCC)    Chronic pain syndrome    Common bile duct stone    Depression    Diverticulosis of colon    Duodenal ulcer hemorrhagic-after biliary sphincterotomy     Dysthymia    Esophagitis    EtOH dependence ([place])    Gastritis    GERD (gastroesophageal reflux disease)    H/O chest pain Dec. 2013   no work up done   Hx of colonic polyps    Hypertension    Irritable bowel syndrome    Lymphocytic colitis 02/16/2020   Osteopenia    Osteoporosis    Pneumonia 2019   Polypharmacy    Reflux esophagitis    Right sided sciatica 12/22/2017   Tobacco use disorder    Unspecified chronic bronchitis (HCC)    Vertigo    Vitamin B12 deficiency    Wears glasses    Past Surgical History:  Procedure Laterality Date   APPENDECTOMY     BACK SURGERY  10-09   Dr. Patrice Paradise   BIOPSY  02/14/2020   Procedure: BIOPSY;  Surgeon: Gatha Mayer, MD;  Location: [facility];  Service: Endoscopy;;   CARPAL TUNNEL RELEASE Right 08/14/2014   Procedure: RIGHT CARPAL TUNNEL RELEASE AND INJECT LEFT THUMB;  Surgeon: Daryll Brod, MD;  Location: [place];  Service: Orthopedics;  Laterality: Right;   CHOLECYSTECTOMY N/A 02/22/2020   Procedure: LAPAROSCOPIC CHOLECYSTECTOMY WITH INTRAOPERATIVE CHOLANGIOGRAM;  Surgeon: Kieth Brightly, Arta Bruce, MD;  Location: [facility];  Service: General;  Laterality: N/A;   COLONOSCOPY     COLONOSCOPY WITH PROPOFOL N/A 02/14/2020   Procedure: COLONOSCOPY WITH PROPOFOL;  Surgeon: Gatha Mayer, MD;  Location: [facility];  Service: Endoscopy;  Laterality: N/A;   ENDOSCOPIC RETROGRADE CHOLANGIOPANCREATOGRAPHY (ERCP) WITH PROPOFOL N/A 12/25/2019   Procedure: ENDOSCOPIC RETROGRADE CHOLANGIOPANCREATOGRAPHY (ERCP) WITH PROPOFOL;  Surgeon: Gatha Mayer, MD;  Location: [place];  Service: Gastroenterology;  Laterality: N/A;   ESOPHAGOGASTRODUODENOSCOPY (EGD) WITH PROPOFOL N/A 01/06/2020   Procedure: ESOPHAGOGASTRODUODENOSCOPY (EGD) WITH PROPOFOL;  Surgeon: Irene Shipper, MD;  Location: [facility];  Service: Endoscopy;  Laterality: N/A;   HOT HEMOSTASIS N/A 01/06/2020   Procedure: HOT HEMOSTASIS (ARGON PLASMA COAGULATION/BICAP);  Surgeon:  Irene Shipper, MD;  Location: [facility] ENDOSCOPY;  Service: Endoscopy;  Laterality: N/A;   INTRAMEDULLARY (IM) NAIL INTERTROCHANTERIC Right 10/01/2018   Procedure: INTRAMEDULLARY (IM) NAIL INTERTROCHANTRIC;  Surgeon: Thornton Park, MD;  Location: [facility];  Service: Orthopedics;  Laterality: Right;   LAPAROSCOPY N/A 02/21/2013   Procedure: LAPAROSCOPY OPERATIVE;  Surgeon: Margarette Asal, MD;  Location: [place] [facility];  Service: Gynecology;  Laterality: N/A;  REQUESTING 5MM SCOPE WITH CAMERA   LEFT HEART CATH AND CORONARY ANGIOGRAPHY N/A 05/11/2019   Procedure: LEFT HEART CATH AND CORONARY ANGIOGRAPHY;  Surgeon: Sherren Mocha, MD;  Location: [place] [facility];  Service: Cardiovascular;  Laterality: N/A;   REMOVAL OF STONES  12/25/2019   Procedure: Karlyn Agee;  Surgeon: Gatha Mayer, MD;  Location: [facility] ENDOSCOPY;  Service: Gastroenterology;;   Clide Deutscher  01/06/2020   Procedure: Clide Deutscher;  Surgeon: Irene Shipper, MD;  Location[facility] ENDOSCOPY;  Service: Endoscopy;;   SPHINCTEROTOMY  12/25/2019   Procedure: Joan Mayans;  Surgeon: Gatha Mayer, MD;  Location: [place];  Service: Gastroenterology;;   TONSILLECTOMY     TUBAL LIGATION     UPPER GASTROINTESTINAL ENDOSCOPY      reports that she has been smoking cigarettes. She has a 30.00 pack-year smoking history. She has never used smokeless tobacco. She reports current alcohol use of about 3.0 standard drinks per week. She reports that she does not use drugs. family history includes Alcohol abuse in her daughter; Bipolar disorder in her daughter; Colon cancer in her brother and father; Drug abuse in her daughter; Heart disease in her mother; Prostate cancer in her father; Skin cancer in her sister. Allergies  Allergen Reactions   Atorvastatin Nausea And Vomiting   Levofloxacin Other (See Comments)    Achilles tendon pain   Omnicef [Cefdinir] Diarrhea and Other (See Comments)    Uncontrollable diarrhea   Trazodone And Nefazodone Other  (See Comments)    "Felt like I was going to faint"   Sulfa Antibiotics Rash   Sulfonamide Derivatives Rash      Outpatient Encounter Medications as of 10/21/2021  Medication Sig   albuterol (VENTOLIN HFA) 108 (90 Base) MCG/ACT inhaler Inhale 1-2 puffs into the lungs every 6 (six) hours as needed for wheezing or shortness of breath.   buPROPion (WELLBUTRIN SR) 150 MG 12 hr tablet Take 1 tablet (150 mg total) by mouth daily.   cyclobenzaprine (FLEXERIL) 5 MG tablet Take 1 tablet (5 mg total) by mouth 3 (three) times daily as needed for muscle spasms.   denosumab (PROLIA) 60 MG/ML SOSY injection Inject 60 mg into the skin every 6 (six) months.   diclofenac Sodium (VOLTAREN) 1 % GEL Apply 2 g topically 4 (four) times daily as needed (  for joint pain).   doxycycline (VIBRA-TABS) 100 MG tablet Take 1 tablet (100 mg total) by mouth 2 (two) times daily.   DULoxetine (CYMBALTA) 30 MG capsule Take 30 mg by mouth daily.   HYDROcodone bit-homatropine (HYCODAN) 5-1.5 MG/5ML syrup Take 5 mLs by mouth every 6 (six) hours as needed for up to 10 days.   hyoscyamine (ANASPAZ) 0.125 MG TBDP disintergrating tablet Place 1 tablet (0.125 mg total) under the tongue every 6 (six) hours as needed ([name] cramps).   Melatonin 10 MG TABS Take 10 mg by mouth at bedtime as needed (sleep).   nicotine (NICODERM CQ - DOSED IN MG/24 HOURS) 21 mg/24hr patch Place 1 patch (21 mg total) onto the skin daily.   ondansetron (ZOFRAN-ODT) 4 MG disintegrating tablet DISSOLVE 1 TABLET ON TONGUE EVERY 8 HOURS AS NEEDED FOR NAUSEA/VOMITING.   Oxycodone HCl 10 MG TABS Take 1 tablet (10 mg total) by mouth 2 (two) times daily as needed.   pantoprazole (PROTONIX) 40 MG tablet TAKE (1) TABLET TWICE A DAY BEFORE MEALS.   potassium chloride (KLOR-CON) 10 MEQ tablet Take 1 tablet (10 mEq total) by mouth daily.   predniSONE (DELTASONE) 10 MG tablet 3 tabs by mouth per day for 3 days,2tabs per day for 3 days,1tab per day for 3 days   rosuvastatin  (CRESTOR) 5 MG tablet Take 1 tablet (5 mg total) by mouth 3 (three) times a week. Please keep upcoming appt in January 2023 with Dr. Johnsie Cancel before anymore refills. Thank you Final Attempt   sertraline (ZOLOFT) 100 MG tablet Take 2 tablets (200 mg total) by mouth at bedtime.   spironolactone (ALDACTONE) 25 MG tablet Take 0.5 tablets (12.5 mg total) by mouth daily.   valACYclovir (VALTREX) 500 MG tablet Take 500 mg by mouth 2 (two) times daily as needed (flare ups).   budesonide (ENTOCORT EC) 3 MG 24 hr capsule TAKE 3 CAPSULES ONCE DAILY. (Patient not taking: Reported on 10/21/2021)   naloxone (NARCAN) nasal spray 4 mg/0.1 mL Place 1 spray into the nose as needed (overdose). (Patient not taking: Reported on 10/21/2021)   nitroGLYCERIN (NITROSTAT) 0.4 MG SL tablet Place 1 tablet (0.4 mg total) under the tongue every 5 (five) minutes as needed for chest pain. (Patient not taking: Reported on 10/21/2021)   [DISCONTINUED] sucralfate (CARAFATE) 1 g tablet Take 1 tablet (1 g total) by mouth 4 (four) times daily -  with meals and at bedtime for 14 days. (Patient taking differently: Take 1 g by mouth in the morning and at bedtime.)   No facility-administered encounter medications on file as of 10/21/2021.     REVIEW OF SYSTEMS  : All other systems reviewed and negative except where noted in the History of Present Illness.   PHYSICAL EXAM: BP (!) 144/86 (BP Location: Left Arm, Patient Position: Sitting, Cuff Size: Normal)   Pulse 96   Ht 5\' 1"  (1.549 m)   Wt 107 lb 4 oz (48.6 kg)   BMI 20.26 kg/m  General: Well developed white female in no acute distress Head: Normocephalic and atraumatic Eyes:  Sclerae anicteric, conjunctiva pink. Ears: Normal auditory acuity Lungs: Clear throughout to auscultation; no W/R/R. Heart: Regular rate and rhythm; no M/R/G. Abdomen: Soft, non-distended.  BS present.  Mild epigastric TTP. Musculoskeletal: Symmetrical with no gross deformities  Skin: No lesions on  visible extremities Extremities: No edema  Neurological: Alert oriented x 4, grossly non-focal Psychological:  Alert and cooperative. Normal mood and affect  ASSESSMENT AND PLAN: *Lymphocytic colitis  with diarrhea: Previously on budesonide, but she thinks it has been ineffective recently and is extremely expensive since she is now in the "donut hole".  It appears that the formulation she has been on is the Entocort formulation.  I wonder if she would do better with a different formulation.  Unfortunately Uceris is not covered by her insurance.  I believe there is a compounded budesonide that we can get from a pharmacy.  I am going to look into that.  Currently she is doing well as she is on prednisone for bronchitis, but obviously will need a treatment plan following that course of prednisone.  We discussed retrying the cholestyramine as she still has a lot of that left at home.  She says that she was unable to tolerate it previously when trying to mix it in liquid.  It sounds like she has a bad gag reflex.  I suggested instead that maybe she try it in applesauce or something. *Acute nausea and vomiting for the past 3 to 4 days: As mentioned above she is now on prednisone as well as doxycycline for treatment of bronchitis.  Vomiting started after beginning these medications.  Question side effect of the medication, especially doxycycline.  While she completes the course of this medication I am going to have her take her Zofran scheduled around-the-clock at mealtimes and at bedtime for the next week or so. *Epigastric abdominal: Sounds like there might be an acute and chronic component to it.  It acutely may be worsened by recent nausea and vomiting.  Chronically possibly some dyspepsia and related to her GERD.  Is on pantoprazole 40 mg twice daily.  **She mentions a few times that she is under a lot of stress with caring for her husband whose health has been declining and also caring for her daughter who  lives with her who suffers from bipolar and refuses to take her medications a lot of times.  She reports that she is unable to sleep because her daughter cannot be left alone and does not sleep at night.  She says that she got maybe 3 hours of sleep last night.  I think that all of this could be playing into her symptoms as well.  CC:  Biagio Borg, MD

## 2021-10-21 NOTE — Patient Instructions (Addendum)
We have sent the following medications to your pharmacy for you to pick up at your convenience: Zofran 4 mg ODT four times daily before meals and at bedtime for next 7 days.  If you are age 68 or older, your body mass index should be between 23-30. Your Body mass index is 20.26 kg/m. If this is out of the aforementioned range listed, please consider follow up with your Primary Care Provider.  If you are age 43 or younger, your body mass index should be between 19-25. Your Body mass index is 20.26 kg/m. If this is out of the aformentioned range listed, please consider follow up with your Primary Care Provider.   ________________________________________________________  The [place] [facility] providers would like to encourage you to use [facility] to communicate with providers for non-urgent requests or questions.  Due to long hold times on the telephone, sending your provider a message by [facility] may be a faster and more efficient way to get a response.  Please allow 48 business hours for a response.  Please remember that this is for non-urgent requests.  _______________________________________________________

## 2021-10-23 ENCOUNTER — Telehealth: Payer: Self-pay

## 2021-10-23 MED ORDER — AMBULATORY NON FORMULARY MEDICATION: 10.0000 mg | Freq: Every day | 2 refills | Status: DC

## 2021-10-23 NOTE — Telephone Encounter (Signed)
Spoke with the pt and she is able to go pick up the prescription from [facility].  Prescription was faxed.  She will begin after she finishes the prednisone.  Appt made to see Dr Carlean Purl on 12/30/21 at 1030 am.

## 2021-10-23 NOTE — Telephone Encounter (Signed)
-----   Message from Loralie Champagne, PA-C sent at 10/23/2021  8:43 AM EDT ----- Can we please see if [facility] in [place] delivers or mails medications?  If so then please prescribe Budesonide 5 mg DR caps- give two 5 mg (total 10 mg) once daily #60 refill 2 for her to begin once she finishes taking her prednisone.  Or if they don't then see if she is able to drive there to get it.  Please schedule her for follow-up with Dr. Carlean Purl in 6-8 weeks as well.  Thank you,  Jess

## 2021-10-28 NOTE — Telephone Encounter (Signed)
Needs OV.  

## 2021-10-29 ENCOUNTER — Ambulatory Visit: Payer: Medicare HMO | Admitting: Internal Medicine

## 2021-10-29 DIAGNOSIS — R69 Illness, unspecified: Secondary | ICD-10-CM | POA: Diagnosis not present

## 2021-10-29 DIAGNOSIS — F411 Generalized anxiety disorder: Secondary | ICD-10-CM | POA: Diagnosis not present

## 2021-10-29 DIAGNOSIS — F17200 Nicotine dependence, unspecified, uncomplicated: Secondary | ICD-10-CM | POA: Diagnosis not present

## 2021-10-29 DIAGNOSIS — F331 Major depressive disorder, recurrent, moderate: Secondary | ICD-10-CM | POA: Diagnosis not present

## 2021-10-29 DIAGNOSIS — Z638 Other specified problems related to primary support group: Secondary | ICD-10-CM | POA: Diagnosis not present

## 2021-11-03 ENCOUNTER — Observation Stay (HOSPITAL_COMMUNITY)
Admission: EM | Admit: 2021-11-03 | Discharge: 2021-11-05 | Disposition: A | Discharge status: Home / Self Care | Payer: Medicare HMO | Attending: Emergency Medicine | Admitting: Emergency Medicine

## 2021-11-03 ENCOUNTER — Emergency Department (HOSPITAL_COMMUNITY): Payer: Medicare HMO

## 2021-11-03 ENCOUNTER — Other Ambulatory Visit: Payer: Self-pay

## 2021-11-03 DIAGNOSIS — R059 Cough, unspecified: Secondary | ICD-10-CM | POA: Diagnosis not present

## 2021-11-03 DIAGNOSIS — R0902 Hypoxemia: Secondary | ICD-10-CM | POA: Diagnosis not present

## 2021-11-03 DIAGNOSIS — I10 Essential (primary) hypertension: Secondary | ICD-10-CM | POA: Diagnosis not present

## 2021-11-03 DIAGNOSIS — Z720 Tobacco use: Secondary | ICD-10-CM | POA: Diagnosis not present

## 2021-11-03 DIAGNOSIS — I110 Hypertensive heart disease with heart failure: Secondary | ICD-10-CM | POA: Diagnosis not present

## 2021-11-03 DIAGNOSIS — Z20822 Contact with and (suspected) exposure to covid-19: Secondary | ICD-10-CM | POA: Diagnosis not present

## 2021-11-03 DIAGNOSIS — I214 Non-ST elevation (NSTEMI) myocardial infarction: Secondary | ICD-10-CM | POA: Diagnosis not present

## 2021-11-03 DIAGNOSIS — J449 Chronic obstructive pulmonary disease, unspecified: Secondary | ICD-10-CM | POA: Diagnosis not present

## 2021-11-03 DIAGNOSIS — R69 Illness, unspecified: Secondary | ICD-10-CM | POA: Diagnosis not present

## 2021-11-03 DIAGNOSIS — J439 Emphysema, unspecified: Secondary | ICD-10-CM | POA: Diagnosis not present

## 2021-11-03 DIAGNOSIS — E785 Hyperlipidemia, unspecified: Secondary | ICD-10-CM | POA: Diagnosis present

## 2021-11-03 DIAGNOSIS — Z79899 Other long term (current) drug therapy: Secondary | ICD-10-CM | POA: Insufficient documentation

## 2021-11-03 DIAGNOSIS — I5089 Other heart failure: Secondary | ICD-10-CM | POA: Insufficient documentation

## 2021-11-03 DIAGNOSIS — R079 Chest pain, unspecified: Secondary | ICD-10-CM | POA: Diagnosis not present

## 2021-11-03 DIAGNOSIS — Z9981 Dependence on supplemental oxygen: Secondary | ICD-10-CM | POA: Diagnosis not present

## 2021-11-03 DIAGNOSIS — F17210 Nicotine dependence, cigarettes, uncomplicated: Secondary | ICD-10-CM | POA: Diagnosis not present

## 2021-11-03 DIAGNOSIS — Z743 Need for continuous supervision: Secondary | ICD-10-CM | POA: Diagnosis not present

## 2021-11-03 DIAGNOSIS — R0789 Other chest pain: Secondary | ICD-10-CM | POA: Diagnosis not present

## 2021-11-03 DIAGNOSIS — R0602 Shortness of breath: Secondary | ICD-10-CM | POA: Diagnosis not present

## 2021-11-03 DIAGNOSIS — I509 Heart failure, unspecified: Secondary | ICD-10-CM | POA: Diagnosis not present

## 2021-11-03 LAB — CBC WITH DIFFERENTIAL/PLATELET
Abs Immature Granulocytes: 0.02 10*3/uL (ref 0.00–0.07)
Basophils Absolute: 0 10*3/uL (ref 0.0–0.1)
Basophils Relative: 1 %
Eosinophils Absolute: 0.1 10*3/uL (ref 0.0–0.5)
Eosinophils Relative: 1 %
HCT: 39.3 % (ref 36.0–46.0)
Hemoglobin: 12.5 g/dL (ref 12.0–15.0)
Immature Granulocytes: 0 %
Lymphocytes Relative: 29 %
Lymphs Abs: 2.3 10*3/uL (ref 0.7–4.0)
MCH: 31.5 pg (ref 26.0–34.0)
MCHC: 31.8 g/dL (ref 30.0–36.0)
MCV: 99 fL (ref 80.0–100.0)
Monocytes Absolute: 0.7 10*3/uL (ref 0.1–1.0)
Monocytes Relative: 9 %
Neutro Abs: 4.7 10*3/uL (ref 1.7–7.7)
Neutrophils Relative %: 60 %
Platelets: 271 10*3/uL (ref 150–400)
RBC: 3.97 MIL/uL (ref 3.87–5.11)
RDW: 12.6 % (ref 11.5–15.5)
WBC: 8 10*3/uL (ref 4.0–10.5)
nRBC: 0 % (ref 0.0–0.2)

## 2021-11-03 LAB — TROPONIN I (HIGH SENSITIVITY)
Troponin I (High Sensitivity): 111 ng/L (ref <18)
Troponin I (High Sensitivity): 269 ng/L (ref <18)
Troponin I (High Sensitivity): 176 ng/L (ref <18)

## 2021-11-03 LAB — BASIC METABOLIC PANEL
Anion gap: 8 (ref 5–15)
BUN: 5 mg/dL — ABNORMAL LOW (ref 8–23)
CO2: 28 mmol/L (ref 22–32)
Calcium: 8.7 mg/dL — ABNORMAL LOW (ref 8.9–10.3)
Chloride: 103 mmol/L (ref 98–111)
Creatinine, Ser: 0.8 mg/dL (ref 0.44–1.00)
GFR, Estimated: >60 mL/min (ref >60)
Glucose, Bld: 96 mg/dL (ref 70–99)
Potassium: 3.6 mmol/L (ref 3.5–5.1)
Sodium: 139 mmol/L (ref 135–145)

## 2021-11-03 LAB — BRAIN NATRIURETIC PEPTIDE: B Natriuretic Peptide: 60.8 pg/mL (ref 0.0–100.0)

## 2021-11-03 LAB — RESP PANEL BY RT-PCR (FLU A&B, COVID) ARPGX2
Influenza A by PCR: NEGATIVE
Influenza B by PCR: NEGATIVE
SARS Coronavirus 2 by RT PCR: NEGATIVE

## 2021-11-03 LAB — HEPARIN LEVEL (UNFRACTIONATED): Heparin Unfractionated: 0.28 IU/mL — ABNORMAL LOW (ref 0.30–0.70)

## 2021-11-03 MED ORDER — SODIUM CHLORIDE 0.9 % WEIGHT BASED INFUSION
1.0000 mL/kg/h | INTRAVENOUS | Status: DC
  Administered 2021-11-04: 1 mL/kg/h via INTRAVENOUS

## 2021-11-03 MED ORDER — SODIUM CHLORIDE 0.9 % WEIGHT BASED INFUSION: 3.0000 mL/kg/h | INTRAVENOUS | Status: AC

## 2021-11-03 MED ORDER — ASPIRIN 81 MG PO CHEW
81.0000 mg | CHEWABLE_TABLET | ORAL | Status: AC
  Administered 2021-11-04: 81 mg via ORAL
  Filled 2021-11-03: qty 1

## 2021-11-03 MED ORDER — GUAIFENESIN 100 MG/5ML PO LIQD
5.0000 mL | ORAL | Status: DC | PRN
  Administered 2021-11-03 – 2021-11-04 (×3): 5 mL via ORAL
  Filled 2021-11-03 (×3): qty 5

## 2021-11-03 MED ORDER — NITROGLYCERIN 0.4 MG SL SUBL: 0.4000 mg | SUBLINGUAL_TABLET | SUBLINGUAL | Status: DC | PRN

## 2021-11-03 MED ORDER — BUPROPION HCL ER (SR) 150 MG PO TB12
150.0000 mg | ORAL_TABLET | Freq: Every day | ORAL | Status: DC
  Administered 2021-11-04 – 2021-11-05 (×2): 150 mg via ORAL
  Filled 2021-11-03 (×2): qty 1

## 2021-11-03 MED ORDER — PANTOPRAZOLE SODIUM 40 MG PO TBEC
40.0000 mg | DELAYED_RELEASE_TABLET | Freq: Two times a day (BID) | ORAL | Status: DC
  Administered 2021-11-04 – 2021-11-05 (×3): 40 mg via ORAL
  Filled 2021-11-03 (×3): qty 1

## 2021-11-03 MED ORDER — METHYLPREDNISOLONE SODIUM SUCC 125 MG IJ SOLR
125.0000 mg | Freq: Once | INTRAMUSCULAR | Status: AC
  Administered 2021-11-03: 125 mg via INTRAVENOUS
  Filled 2021-11-03: qty 2

## 2021-11-03 MED ORDER — SODIUM CHLORIDE 0.9 % IV SOLN: 250.0000 mL | INTRAVENOUS | Status: DC | PRN

## 2021-11-03 MED ORDER — ALUM & MAG HYDROXIDE-SIMETH 200-200-20 MG/5ML PO SUSP
30.0000 mL | Freq: Four times a day (QID) | ORAL | Status: DC | PRN
  Administered 2021-11-03: 30 mL via ORAL
  Filled 2021-11-03: qty 30

## 2021-11-03 MED ORDER — ALBUTEROL SULFATE (2.5 MG/3ML) 0.083% IN NEBU: 3.0000 mL | INHALATION_SOLUTION | Freq: Four times a day (QID) | RESPIRATORY_TRACT | Status: DC | PRN

## 2021-11-03 MED ORDER — ASPIRIN EC 81 MG PO TBEC
81.0000 mg | DELAYED_RELEASE_TABLET | Freq: Every day | ORAL | Status: DC
  Administered 2021-11-04 – 2021-11-05 (×2): 81 mg via ORAL
  Filled 2021-11-03 (×2): qty 1

## 2021-11-03 MED ORDER — SODIUM CHLORIDE 0.9% FLUSH
3.0000 mL | Freq: Two times a day (BID) | INTRAVENOUS | Status: DC
  Administered 2021-11-04: 3 mL via INTRAVENOUS

## 2021-11-03 MED ORDER — HEPARIN BOLUS VIA INFUSION
3000.0000 [IU] | Freq: Once | INTRAVENOUS | Status: AC
  Administered 2021-11-03: 3000 [IU] via INTRAVENOUS
  Filled 2021-11-03: qty 3000

## 2021-11-03 MED ORDER — IPRATROPIUM-ALBUTEROL 0.5-2.5 (3) MG/3ML IN SOLN
3.0000 mL | RESPIRATORY_TRACT | Status: DC
  Administered 2021-11-03 (×3): 3 mL via RESPIRATORY_TRACT
  Filled 2021-11-03 (×3): qty 3

## 2021-11-03 MED ORDER — MELATONIN 5 MG PO TABS
10.0000 mg | ORAL_TABLET | Freq: Every evening | ORAL | Status: DC | PRN
  Administered 2021-11-03 – 2021-11-04 (×2): 10 mg via ORAL
  Filled 2021-11-03 (×2): qty 2

## 2021-11-03 MED ORDER — ONDANSETRON HCL 4 MG/2ML IJ SOLN
4.0000 mg | Freq: Four times a day (QID) | INTRAMUSCULAR | Status: DC | PRN
  Administered 2021-11-05: 4 mg via INTRAVENOUS
  Filled 2021-11-03: qty 2

## 2021-11-03 MED ORDER — NICOTINE 21 MG/24HR TD PT24
21.0000 mg | MEDICATED_PATCH | Freq: Every day | TRANSDERMAL | Status: DC
  Administered 2021-11-05: 21 mg via TRANSDERMAL
  Filled 2021-11-03: qty 1

## 2021-11-03 MED ORDER — ACETAMINOPHEN 325 MG PO TABS
650.0000 mg | ORAL_TABLET | ORAL | Status: DC | PRN
  Administered 2021-11-05 (×2): 650 mg via ORAL
  Filled 2021-11-03 (×2): qty 2

## 2021-11-03 MED ORDER — BUSPIRONE HCL 5 MG PO TABS
10.0000 mg | ORAL_TABLET | Freq: Every day | ORAL | Status: DC
  Administered 2021-11-04 – 2021-11-05 (×2): 10 mg via ORAL
  Filled 2021-11-03 (×2): qty 2

## 2021-11-03 MED ORDER — OXYCODONE HCL 5 MG PO TABS
10.0000 mg | ORAL_TABLET | Freq: Two times a day (BID) | ORAL | Status: DC | PRN
  Administered 2021-11-03 – 2021-11-05 (×4): 10 mg via ORAL
  Filled 2021-11-03 (×4): qty 2

## 2021-11-03 MED ORDER — SODIUM CHLORIDE 0.9% FLUSH: 3.0000 mL | INTRAVENOUS | Status: DC | PRN

## 2021-11-03 MED ORDER — HEPARIN (PORCINE) 25000 UT/250ML-% IV SOLN
700.0000 [IU]/h | INTRAVENOUS | Status: DC
  Administered 2021-11-03: 600 [IU]/h via INTRAVENOUS
  Filled 2021-11-03: qty 250

## 2021-11-03 MED ORDER — SERTRALINE HCL 100 MG PO TABS
200.0000 mg | ORAL_TABLET | Freq: Every day | ORAL | Status: DC
  Administered 2021-11-03 – 2021-11-04 (×2): 200 mg via ORAL
  Filled 2021-11-03 (×2): qty 2

## 2021-11-03 MED ORDER — IPRATROPIUM-ALBUTEROL 0.5-2.5 (3) MG/3ML IN SOLN
3.0000 mL | Freq: Three times a day (TID) | RESPIRATORY_TRACT | Status: DC
  Administered 2021-11-04: 3 mL via RESPIRATORY_TRACT
  Filled 2021-11-03: qty 3

## 2021-11-03 MED ORDER — HEPARIN BOLUS VIA INFUSION
1000.0000 [IU] | Freq: Once | INTRAVENOUS | Status: AC
  Administered 2021-11-03: 1000 [IU] via INTRAVENOUS
  Filled 2021-11-03: qty 1000

## 2021-11-03 MED ORDER — BUDESONIDE 3 MG PO CPEP
3.0000 mg | ORAL_CAPSULE | Freq: Every day | ORAL | Status: DC
  Administered 2021-11-03 – 2021-11-05 (×3): 3 mg via ORAL
  Filled 2021-11-03 (×3): qty 1

## 2021-11-03 MED ORDER — DULOXETINE HCL 30 MG PO CPEP
30.0000 mg | ORAL_CAPSULE | Freq: Every day | ORAL | Status: DC
  Administered 2021-11-04 – 2021-11-05 (×2): 30 mg via ORAL
  Filled 2021-11-03 (×2): qty 1

## 2021-11-03 MED ORDER — ROSUVASTATIN CALCIUM 5 MG PO TABS: 5.0000 mg | ORAL_TABLET | ORAL | Status: DC

## 2021-11-03 NOTE — H&P (Addendum)
Cardiology Consultation:   Patient ID: Denise King MRN: [id number]; DOB: March 22, 1951  Admit date: 11/03/2021 Date of Consult: 11/03/2021  PCP:  Biagio Borg, MD   [facility] Providers Cardiologist:  Jenkins Rouge, MD   {   Patient Profile:   Denise King is a 70 y.o. female with a hx of mild to moderate CAD, COPD with ongoing tobacco smoking, hypertension, hyperlipidemia and lymphocytic colitis with diarrhea who is being seen 11/03/2021 for the evaluation of chest pain at the request of Dr. Eulis Foster.  Cardiac CTA done 05/09/19 showed calcium score 1304, 99 th percentile with positive FFR CT in RCA/LAD. Follow up cath 05/11/19 with only 50% mid RCA and 50% prox/mid LAD >>>Medical Rx recommended.  Admitted May 2022 for hypercapnic respiratory failure in setting of narcotic use for chronic back pain and lumbar fracture with underlying emphysema.  Establish care with pulmonary July 2022 for COPD.  Has pending pulmonary function test.  She is also dealing with cough with congestion treated with antibiotic on 10/16/21 however worsen cough.   Patient with history of lymphocytic colitis with diarrhea.  Patient is followed by GI and previously on budesonide.  Last seen by GI October 25.  Had worsening epi gastritis given recent nausea and vomiting.  History of Present Illness:   [name] reports she has not felt well for past 2 to 3 weeks since diagnosed with bronchitis.  Her cough has gotten progressively worse despite treatment with antibiotic and steroids.  No regular exercise.  She continues to smoke about half pack per day.  She is under stress as well.  This morning around 9 AM patient had substernal burning chest pressure radiating underneath her left axilla.  She took sublingual nitroglycerin with minimal improvement but pain reoccurred.  She took total 4 sublingual nitroglycerin but pain persisted intermittently and EMS was called.  Eventually pain resolved in route and no  reoccurrence since then.  BNP 111>>269 Scr 3.6 BNP 60.8 Chest x-ray with chronic emphysema.  No acute abnormality.  Past Medical History:  Diagnosis Date   Acute cystitis    Acute respiratory failure ([place])    Allergy    as a child grew out of them   Anemia    pernicious anemia   Anxiety    Arthritis    Back pain    Blood transfusion    CAP (community acquired pneumonia)    CHF (congestive heart failure) (HCC)    Chronic pain syndrome    Common bile duct stone    Depression    Diverticulosis of colon    Duodenal ulcer hemorrhagic-after biliary sphincterotomy    Dysthymia    Esophagitis    EtOH dependence ([place])    Gastritis    GERD (gastroesophageal reflux disease)    H/O chest pain Dec. 2013   no work up done   Hx of colonic polyps    Hypertension    Irritable bowel syndrome    Lymphocytic colitis 02/16/2020   Osteopenia    Osteoporosis    Pneumonia 2019   Polypharmacy    Reflux esophagitis    Right sided sciatica 12/22/2017   Tobacco use disorder    Unspecified chronic bronchitis (HCC)    Vertigo    Vitamin B12 deficiency    Wears glasses     Past Surgical History:  Procedure Laterality Date   APPENDECTOMY     BACK SURGERY  10-09   Dr. Patrice Paradise   BIOPSY  02/14/2020   Procedure:  BIOPSY;  Surgeon: Gatha Mayer, MD;  Location: [facility];  Service: Endoscopy;;   CARPAL TUNNEL RELEASE Right 08/14/2014   Procedure: RIGHT CARPAL TUNNEL RELEASE AND INJECT LEFT THUMB;  Surgeon: Daryll Brod, MD;  Location: [place];  Service: Orthopedics;  Laterality: Right;   CHOLECYSTECTOMY N/A 02/22/2020   Procedure: LAPAROSCOPIC CHOLECYSTECTOMY WITH INTRAOPERATIVE CHOLANGIOGRAM;  Surgeon: Kieth Brightly Arta Bruce, MD;  Location: [facility];  Service: General;  Laterality: N/A;   COLONOSCOPY     COLONOSCOPY WITH PROPOFOL N/A 02/14/2020   Procedure: COLONOSCOPY WITH PROPOFOL;  Surgeon: Gatha Mayer, MD;  Location: [facility];  Service: Endoscopy;  Laterality: N/A;    ENDOSCOPIC RETROGRADE CHOLANGIOPANCREATOGRAPHY (ERCP) WITH PROPOFOL N/A 12/25/2019   Procedure: ENDOSCOPIC RETROGRADE CHOLANGIOPANCREATOGRAPHY (ERCP) WITH PROPOFOL;  Surgeon: Gatha Mayer, MD;  Location: [place];  Service: Gastroenterology;  Laterality: N/A;   ESOPHAGOGASTRODUODENOSCOPY (EGD) WITH PROPOFOL N/A 01/06/2020   Procedure: ESOPHAGOGASTRODUODENOSCOPY (EGD) WITH PROPOFOL;  Surgeon: Irene Shipper, MD;  Location: [facility]COPY;  Service: Endoscopy;  Laterality: N/A;   HOT HEMOSTASIS N/A 01/06/2020   Procedure: HOT HEMOSTASIS (ARGON PLASMA COAGULATION/BICAP);  Surgeon: Irene Shipper, MD;  Location: [facility]COPY;  Service: Endoscopy;  Laterality: N/A;   INTRAMEDULLARY (IM) NAIL INTERTROCHANTERIC Right 10/01/2018   Procedure: INTRAMEDULLARY (IM) NAIL INTERTROCHANTRIC;  Surgeon: Thornton Park, MD;  Location: [facility]S;  Service: Orthopedics;  Laterality: Right;   LAPAROSCOPY N/A 02/21/2013   Procedure: LAPAROSCOPY OPERATIVE;  Surgeon: Margarette Asal, MD;  Location: [place] [facility];  Service: Gynecology;  Laterality: N/A;  REQUESTING 5MM SCOPE WITH CAMERA   LEFT HEART CATH AND CORONARY ANGIOGRAPHY N/A 05/11/2019   Procedure: LEFT HEART CATH AND CORONARY ANGIOGRAPHY;  Surgeon: Sherren Mocha, MD;  Location: [place] [facility];  Service: Cardiovascular;  Laterality: N/A;   REMOVAL OF STONES  12/25/2019   Procedure: Karlyn Agee;  Surgeon: Gatha Mayer, MD;  Location: [facility];  Service: Gastroenterology;;   Clide Deutscher  01/06/2020   Procedure: Clide Deutscher;  Surgeon: Irene Shipper, MD;  Location: [facility] ENDOSCOPY;  Service: Endoscopy;;   SPHINCTEROTOMY  12/25/2019   Procedure: Joan Mayans;  Surgeon: Gatha Mayer, MD;  Location: [facility] ENDOSCOPY;  Service: Gastroenterology;;   TONSILLECTOMY     TUBAL LIGATION     UPPER GASTROINTESTINAL ENDOSCOPY       Inpatient Medications: Scheduled Meds:  ipratropium-albuterol  3 mL Nebulization Q4H   Continuous Infusions:  PRN Meds:  Allergies:     Allergies  Allergen Reactions   Atorvastatin Nausea And Vomiting   Levofloxacin Other (See Comments)    Achilles tendon pain   Omnicef [Cefdinir] Diarrhea and Other (See Comments)    Uncontrollable diarrhea   Trazodone And Nefazodone Other (See Comments)    "Felt like I was going to faint"   Sulfa Antibiotics Rash   Sulfonamide Derivatives Rash    Social History:   Social History   Socioeconomic History   Marital status: Married    Spouse name: Not on file   Number of children: 4   Years of education: 16   Highest education level: Not on file  Occupational History   Occupation: retired    Comment: disablility  Tobacco Use   Smoking status: Every Day    Packs/day: 1.00    Years: 30.00    Pack years: 30.00    Types: Cigarettes   Smokeless tobacco: Never  Vaping Use   Vaping Use: Never used  Substance and Sexual Activity   Alcohol use: Yes  Alcohol/week: 3.0 standard drinks    Types: 3 Standard drinks or equivalent per week    Comment: occasional   Drug use: No   Sexual activity: Yes  Other Topics Concern   Not on file  Social History Narrative   Married x 38 yrs. Has BS degree in Horticulture from [place]. Currently resides in a house with her husband. 3 cats.  Fun: used to like to do a lot of things.    Denies religious beliefs that would effect health care.    lives with husband [name]; quit smoking- October 6th, 2019. ocasional alcohol. [employer] [2009] on disability sec to back issues.    Social Determinants of Health   Financial Resource Strain: Not on file  Food Insecurity: Not on file  Transportation Needs: Not on file  Physical Activity: Not on file  Stress: Not on file  Social Connections: Not on file  Intimate Partner Violence: Not on file    Family History:    Family History  Problem Relation Age of Onset   Colon cancer Father    Prostate cancer Father    Heart disease Mother        prev MVR, also has DJD   Colon cancer  Brother    Skin cancer Sister    Alcohol abuse Daughter    Bipolar disorder Daughter    Drug abuse Daughter    Esophageal cancer Neg Hx    Rectal cancer Neg Hx    Stomach cancer Neg Hx      ROS:  Please see the history of present illness.   All other ROS reviewed and negative.     Physical Exam/Data:   Vitals:   11/03/21 1452 11/03/21 1456 11/03/21 1457 11/03/21 1500  BP:      Pulse:      Resp:      Temp:      TempSrc:      SpO2:  91% 92% 96%  Weight: 49.9 kg     Height: 5\' 1"  (1.549 m)      No intake or output data in the 24 hours ending 11/03/21 1550 Last 3 Weights 11/03/2021 10/21/2021 10/16/2021  Weight (lbs) 110 lb 107 lb 4 oz 108 lb  Weight (kg) 49.896 kg 48.648 kg 48.988 kg  Some encounter information is confidential and restricted. Go to Review Flowsheets activity to see all data.     Body mass index is 20.78 kg/m.  General:  Well nourished, well developed, in no acute distress HEENT: normal Neck: no JVD Vascular: No carotid bruits; Distal pulses 2+ bilaterally Cardiac:  normal S1, S2; RRR; no murmur  Lungs:  clear to auscultation bilaterally, no wheezing, rhonchi or rales  Abd: soft, nontender, no hepatomegaly  Ext: no edema Musculoskeletal:  No deformities, BUE and BLE strength normal and equal Skin: warm and dry  Neuro:  CNs 2-12 intact, no focal abnormalities noted Psych:  Normal affect   EKG:  The EKG was personally reviewed and demonstrates: Sinus rhythm with anterior lateral T wave inversion Telemetry:  Telemetry was personally reviewed and demonstrates: Sinus rhythm  Relevant CV Studies:  LEFT HEART CATH AND CORONARY ANGIOGRAPHY  04/2019   Conclusion    Mid RCA lesion is 50% stenosed. Prox Cx to Mid Cx lesion is 30% stenosed. Prox LAD to Mid LAD lesion is 50% stenosed. LV end diastolic pressure is low.   1.  Mild to moderate diffuse calcified stenosis of the proximal to mid LAD 2.  Widely patent left  main and left circumflex with  calcified nonobstructive disease 3.  Mild to moderate focal stenosis of the mid RCA, with lesion no greater than 50% does not appear flow-limiting 4.  Low LVEDP of 4 mmHg   Recommendations: Recommend medical therapy.  There is no target lesion for PCI.  The patient is a heavy smoker and tobacco cessation is recommended.   Diagnostic Dominance: Right  Laboratory Data:  High Sensitivity Troponin:   Recent Labs  Lab 11/03/21 1040 11/03/21 1414  TROPONINIHS 111* 269*     Chemistry Recent Labs  Lab 11/03/21 1040  NA 139  K 3.6  CL 103  CO2 28  GLUCOSE 96  BUN 5*  CREATININE 0.80  CALCIUM 8.7*  GFRNONAA >60  ANIONGAP 8     Hematology Recent Labs  Lab 11/03/21 1040  WBC 8.0  RBC 3.97  HGB 12.5  HCT 39.3  MCV 99.0  MCH 31.5  MCHC 31.8  RDW 12.6  PLT 271   Thyroid No results for input(s): TSH, FREET4 in the last 168 hours.  BNP Recent Labs  Lab 11/03/21 1040  BNP 60.8    DDimer No results for input(s): DDIMER in the last 168 hours.   Radiology/Studies:  DG Chest 2 View  Result Date: 11/03/2021 CLINICAL DATA:  70 year old female with chest pain, cough and shortness of breath onset this morning. EXAM: CHEST - 2 VIEW COMPARISON:  Chest CTA 06/22/2021. Radiographs 10/16/2021 and earlier. FINDINGS: Chronic scoliosis and partially visible lumbar fusion hardware. Chronic midthoracic compression fracture and osteopenia. No acute osseous abnormality identified. Stable large lung volumes. Mediastinal contours remain normal. Visualized tracheal air column is within normal limits. Stable mildly increased bilateral lung markings. Both lungs otherwise appear clear. No pneumothorax or pleural effusion. Calcified aortic atherosclerosis.  Stable cholecystectomy clips. IMPRESSION: Chronic emphysema.  No acute cardiopulmonary abnormality. Electronically Signed   By: Genevie Ann M.D.   On: 11/03/2021 11:07     Assessment and Plan:   NSTEMI with hx of non obstructive CAD -Cardiac  catheterization May 2020 showed mild to moderate nonobstructive disease in LAD and RCA.  Patient was treated medically.  She is now presenting with acute onset substernal burning chest pressure radiating to her left axilla.  Recently treated with antibiotic and steroid for acute bronchitis in setting of ongoing tobacco smoking.  Patient reports worsening cough since then.  She does not appear volume overloaded on exam.  No pulmonary edema on chest x-ray.  BNP normal.  She does have significant cardiac risk factors.  Hs-Troponin trending up 111>>269. EKG with new TWI in leads V1-V3.  Currently chest pain-free.   - Received aspirin 324 mg by EMS. - Got total 4 SL nitro prior to arrival  - Start ASA 81mg  qd -History of statin intolerance.  Currently taking Crestor 5 mg 3 times per week.  Will continue.  Repeat lipid panel.  2.  Recent bronchitis  with COPD -Treated with antibiotic and steroid taper.  However, patient reports progressive worsening cough.  Acute disease.  No active wheezing on exam. -ED provider has ordered 1 dose of IV Solu-Medrol and DuoNeb nebulizer every 4 hours. -She has pending outpatient pulmonary function test  3. Tobacco smoking - Cessation advised    Risk Assessment/Risk Scores:   TIMI Risk Score for Unstable Angina or Non-ST Elevation MI:   The patient's TIMI risk score is 5, which indicates a 26% risk of all cause mortality, new or recurrent myocardial infarction or need for urgent revascularization in  the next 14 days.{   For questions or updates, please contact [place] Please consult [email or web address] for contact info under    Jarrett Soho, PA  11/03/2021 3:50 PM   Patient seen, examined. Available data reviewed. Agree with findings, assessment, and plan as outlined by Robbie Lis, PA.  The patient is independently interviewed and examined.  She is alert, oriented, in no distress.  The patient stand.  HEENT is normal.  JVP is normal.  No carotid  bruits.  Lung fields are clear but patient coughs periodically throughout evaluation.  Heart is distant and regular with no murmur gallop.  Abdomen soft with no organomegaly. Skin is warm and dry with no rash.  There is no lower extremity edema.  Neurologic is 5/5 strength bilaterally.  EKG shows normal sinus rhythm with T wave abnormality consider anteroseptal ischemia.  Renal and liver function labs are normal.  BNP is normal.  Troponin is elevated with a initial troponin of 111 followed by a second troponin of 269.  The patient's clinical presentation with chest burning and pressure radiating to the left axilla and left arm associated with EKG changes and mild troponin elevation is concerning for non-ST elevation MI/ACS.  The patient will be treated with IV heparin.  I think cardiac catheterization is indicated for definitive diagnosis in this patient who has been a longstanding smoker and has known moderate CAD.  I reviewed her previous coronary angiogram which was done in 2020.  This demonstrated focal eccentric stenosis of the mid RCA and diffuse calcific mild to moderate proximal LAD stenosis. I have reviewed the risks, indications, and alternatives to cardiac catheterization, possible angioplasty, and stenting with the patient. Risks include but are not limited to bleeding, infection, vascular injury, stroke, myocardial infection, arrhythmia, kidney injury, radiation-related injury in the case of prolonged fluoroscopy use, emergency cardiac surgery, and death. The patient understands the risks of serious complication is 1-2 in 6283 with diagnostic cardiac cath and 1-2% or less with angioplasty/stenting.  The patient's cardiac catheterization case will be scheduled for tomorrow when she has medically refractory ischemic symptoms overnight.  Sherren Mocha, M.D. 11/03/2021 4:08 PM

## 2021-11-03 NOTE — Progress Notes (Signed)
[facility] for heparin Indication: chest pain/ACS  Allergies  Allergen Reactions   Atorvastatin Nausea And Vomiting   Levofloxacin Other (See Comments)    Achilles tendon pain   Omnicef [Cefdinir] Diarrhea and Other (See Comments)    Uncontrollable diarrhea   Trazodone And Nefazodone Other (See Comments)    "Felt like I was going to faint"   Sulfa Antibiotics Rash   Sulfonamide Derivatives Rash    Patient Measurements: Height: 5\' 1"  (154.9 cm) Weight: 49.7 kg (109 lb 8 oz) IBW/kg (Calculated) : 47.8 Heparin Dosing Weight: 49.9 kg   Vital Signs: Temp: 98.4 F (36.9 C) (11/07 2043) Temp Source: Oral (11/07 2043) BP: 153/85 (11/07 2043) Pulse Rate: 100 (11/07 2043)  Labs: Recent Labs    11/03/21 1040 11/03/21 1414 11/03/21 2215  HGB 12.5  --   --   HCT 39.3  --   --   PLT 271  --   --   HEPARINUNFRC  --   --  0.28*  CREATININE 0.80  --   --   TROPONINIHS 111* 269*  --      Estimated Creatinine Clearance: 50.8 mL/min (by C-G formula based on SCr of 0.8 mg/dL).   Medical History: Past Medical History:  Diagnosis Date   Acute cystitis    Acute respiratory failure ([place])    Allergy    as a child grew out of them   Anemia    pernicious anemia   Anxiety    Arthritis    Back pain    Blood transfusion    CAP (community acquired pneumonia)    CHF (congestive heart failure) (HCC)    Chronic pain syndrome    Common bile duct stone    Depression    Diverticulosis of colon    Duodenal ulcer hemorrhagic-after biliary sphincterotomy    Dysthymia    Esophagitis    EtOH dependence ([place])    Gastritis    GERD (gastroesophageal reflux disease)    H/O chest pain Dec. 2013   no work up done   Hx of colonic polyps    Hypertension    Irritable bowel syndrome    Lymphocytic colitis 02/16/2020   Osteopenia    Osteoporosis    Pneumonia 2019   Polypharmacy    Reflux esophagitis    Right sided sciatica 12/22/2017   Tobacco use  disorder    Unspecified chronic bronchitis (HCC)    Vertigo    Vitamin B12 deficiency    Wears glasses     Medications:  Medications Prior to Admission  Medication Sig Dispense Refill Last Dose   albuterol (VENTOLIN HFA) 108 (90 Base) MCG/ACT inhaler Inhale 1-2 puffs into the lungs every 6 (six) hours as needed for wheezing or shortness of breath.   11/02/2021   buPROPion (WELLBUTRIN SR) 150 MG 12 hr tablet Take 1 tablet (150 mg total) by mouth daily. 90 tablet 1 11/02/2021   busPIRone (BUSPAR) 10 MG tablet Take 10 mg by mouth daily.   11/02/2021   cyclobenzaprine (FLEXERIL) 5 MG tablet Take 1 tablet (5 mg total) by mouth 3 (three) times daily as needed for muscle spasms. 60 tablet 2 Past Month   DULoxetine (CYMBALTA) 30 MG capsule Take 30 mg by mouth daily.   11/02/2021   guaiFENesin (MUCINEX CHEST CONGESTION CHILD) 100 MG/5ML liquid Take 5 mLs by mouth every 4 (four) hours as needed for cough or to loosen phlegm.   11/02/2021   hyoscyamine (ANASPAZ) 0.125  MG TBDP disintergrating tablet Place 1 tablet (0.125 mg total) under the tongue every 6 (six) hours as needed ([name] cramps). 90 tablet 3 11/02/2021   Melatonin 10 MG TABS Take 10 mg by mouth at bedtime as needed (sleep).   11/02/2021   naloxone (NARCAN) nasal spray 4 mg/0.1 mL Place 1 spray into the nose as needed (overdose).   unk   nicotine (NICODERM CQ - DOSED IN MG/24 HOURS) 21 mg/24hr patch Place 1 patch (21 mg total) onto the skin daily. 28 patch 0 Past Month   nitroGLYCERIN (NITROSTAT) 0.4 MG SL tablet Place 1 tablet (0.4 mg total) under the tongue every 5 (five) minutes as needed for chest pain. 25 tablet 3 11/03/2021   Oxycodone HCl 10 MG TABS Take 1 tablet (10 mg total) by mouth 2 (two) times daily as needed. 1 tablet 0 11/03/2021   pantoprazole (PROTONIX) 40 MG tablet TAKE (1) TABLET TWICE A DAY BEFORE MEALS. (Patient taking differently: Take 40 mg by mouth 2 (two) times daily before a meal.) 60 tablet 11 11/02/2021   rosuvastatin (CRESTOR)  5 MG tablet Take 1 tablet (5 mg total) by mouth 3 (three) times a week. Please keep upcoming appt in January 2023 with Dr. Johnsie Cancel before anymore refills. Thank you Final Attempt 45 tablet 1 11/02/2021   sertraline (ZOLOFT) 100 MG tablet Take 2 tablets (200 mg total) by mouth at bedtime. 180 tablet 1 11/02/2021   valACYclovir (VALTREX) 500 MG tablet Take 500 mg by mouth 2 (two) times daily as needed (flare ups).   unk   AMBULATORY NON FORMULARY MEDICATION Take 10 mg by mouth daily. Budesonide DR 5 mg capsules 60 capsule 2    budesonide (ENTOCORT EC) 3 MG 24 hr capsule TAKE 3 CAPSULES ONCE DAILY. (Patient not taking: Reported on 11/03/2021) 90 capsule 1 Not Taking   denosumab (PROLIA) 60 MG/ML SOSY injection Inject 60 mg into the skin every 6 (six) months. (Patient not taking: Reported on 11/03/2021)   Not Taking   diclofenac Sodium (VOLTAREN) 1 % GEL Apply 2 g topically 4 (four) times daily as needed (for joint pain). (Patient not taking: Reported on 11/03/2021)   Not Taking   potassium chloride (KLOR-CON) 10 MEQ tablet Take 1 tablet (10 mEq total) by mouth daily. 30 tablet 0     Assessment: 71 YOF who presents with chest pain. Troponins elevated and up trending. Pharmacy consulted to start IV heparin for ACS/NSTEMI.   H/H and Plt wnl.   11/7 PM update:  Heparin level below goal  Goal of Therapy:  Heparin level 0.3-0.7 units/ml Monitor platelets by anticoagulation protocol: Yes   Plan:  -Heparin 1000 units re-bolus -Inc heparin to 700 units/hr -Heparin level and CBC with AM labs  Narda Bonds, PharmD, [place] Pharmacist Phone: [phone]

## 2021-11-03 NOTE — ED Notes (Signed)
Pt ambulated to bathroom 

## 2021-11-03 NOTE — ED Provider Notes (Signed)
[facility] Provider Note   CSN: [id number] Arrival date & time: 11/03/21  1013     History Chief Complaint  Patient presents with   Chest Pain   Shortness of Breath    Denise King is a 70 y.o. female.  Denise King is a 70 y.o. female with a history of CHF, CAD, COPD, arthritis, alcohol dependence, GERD, who presents to the ED via EMS for evaluation of chest pain and shortness of breath.  Patient reports she woke up this morning and walked to the bathroom and as she was coming back to lay down she started having central chest pain described as a heaviness.  She reports this pain radiated into her back and down her left arm.  She reports that she took 4 tablets of nitro at home to try and relieve chest pain and eventually did get some relief.  She reports with this she was feeling increasingly short of breath and reports that she typically only wears 2 L of oxygen at night when sleeping but felt like she needed to leave her oxygen on this morning.  She also reports feeling fatigued and diaphoretic when chest pain began.  Given 325 of aspirin with EMS and now she reports her chest pain has improved, still feeling a bit short of breath.  Pain is not pleuritic at all.  Patient denies any lower extremity swelling or pain.  Does have underlying history of CHF.  No associated abdominal pain, nausea or vomiting.  No lightheadedness or syncope.  Followed by Dr. Johnsie Cancel with cardiology, last heart cath in 2020 with mild to moderate multivessel disease but none that required stenting or intervention.  Patient reports a few weeks ago she was treated for an episode of bronchitis with steroids, reports initially it was improving but now she started to have some worsening cough and shortness of breath.  The history is provided by the patient and medical records.      Past Medical History:  Diagnosis Date   Acute cystitis    Acute respiratory failure  ([place])    Allergy    as a child grew out of them   Anemia    pernicious anemia   Anxiety    Arthritis    Back pain    Blood transfusion    CAP (community acquired pneumonia)    CHF (congestive heart failure) (HCC)    Chronic pain syndrome    Common bile duct stone    Depression    Diverticulosis of colon    Duodenal ulcer hemorrhagic-after biliary sphincterotomy    Dysthymia    Esophagitis    EtOH dependence ([place])    Gastritis    GERD (gastroesophageal reflux disease)    H/O chest pain Dec. 2013   no work up done   Hx of colonic polyps    Hypertension    Irritable bowel syndrome    Lymphocytic colitis 02/16/2020   Osteopenia    Osteoporosis    Pneumonia 2019   Polypharmacy    Reflux esophagitis    Right sided sciatica 12/22/2017   Tobacco use disorder    Unspecified chronic bronchitis (HCC)    Vertigo    Vitamin B12 deficiency    Wears glasses     Patient Active Problem List   Diagnosis Date Noted   COPD (chronic obstructive pulmonary disease) ([place]) 06/30/2021   Vitamin D deficiency 06/30/2021   Chronic hypoxemic respiratory failure ([place]) 06/30/2021   Fatigue 06/26/2021  Acute on chronic respiratory failure ([place]) 06/22/2021   T8 vertebral fracture ([place]) 06/22/2021   Compression fracture of thoracic vertebra ([place]) 05/16/2021   Aortic atherosclerosis ([place]) 03/13/2021   Genital herpes simplex 03/12/2021   Hyperlipidemia 03/12/2021   Severe sepsis (HCC) 02/10/2021   Acute cystitis 02/10/2021   Left hip pain 43/15/4008   Acute metabolic encephalopathy 67/61/9509   Toe fracture, right 02/01/2021   Concussion 02/01/2021   Floaters, bilateral 01/31/2021   Acute on chronic respiratory failure with hypoxia and hypercapnia (HCC) 07/10/2020   Acute on chronic heart failure ([place]) 07/04/2020   Choledocholithiasis 02/22/2020   Lymphocytic colitis 02/16/2020   Duodenal ulcer hemorrhagic    Malnutrition of moderate degree 01/01/2020   Acute respiratory failure with  hypoxia and hypercapnia (HCC) 12/28/2019   Alcohol abuse 12/28/2019   Acute on chronic heart failure with preserved ejection fraction ([place]) 12/28/2019   Abnormal MRI of abdomen    Cholelithiasis    Common bile duct stone 12/23/2019   Acute cholecystitis 12/23/2019   Polycythemia 09/11/2019   Right knee pain 08/09/2019   Quadriceps contusion 08/09/2019   Abnormal cardiac CT angiography 05/11/2019   Chest pain 04/11/2019   Acute viral syndrome 04/11/2019   Hyperphosphatemia 04/11/2019   Osteoporosis 01/03/2019   Closed fracture of neck of femur ([place]) 10/17/2018   Hip fracture ([place]) 10/01/2018   Dysuria 05/20/2018   Right leg swelling 05/20/2018   CAP (community acquired pneumonia) 05/13/2018   Tachycardia 05/13/2018   Chest wall pain 05/13/2018   Narcotic poisoning ([place]) 05/13/2018   EtOH dependence ([place])    Polypharmacy    Esophagitis    Encounter for well adult exam with abnormal findings 12/22/2017   Erythrocytosis 12/22/2017   Depression 12/22/2017   Hyperglycemia 12/22/2017   Right sided sciatica 12/22/2017   Cough 06/10/2017   Family history of colon cancer - brother and father 03/10/2016   Insomnia 02/11/2016   Generalized anxiety disorder 11/25/2015   Urinary frequency 04/25/2015   Nausea and vomiting in adult 11/08/2014   Spinal stenosis of lumbar region without neurogenic claudication 02/14/2014   Cigarette smoker 08/15/2013   Otitis media 10/27/2012   Abdominal pain 03/30/2012   HEPATIC CYST 02/18/2010   Chronic pain 02/08/2010   Vitamin B12 deficiency 10/04/2009   GERD 01/04/2009   ABDOMINAL PAIN-EPIGASTRIC 01/04/2009   Venous (peripheral) insufficiency 08/10/2008   Irritable bowel syndrome 08/10/2008   Back pain 08/10/2008   Hx of adenomatous polyp of colon 12/06/2007   BRONCHITIS, RECURRENT 12/06/2007   DIVERTICULOSIS OF COLON 12/06/2007   Other forms of scoliosis, lumbosacral region 12/06/2007   CALCULUS, KIDNEY 10/07/2007   VERTIGO 10/07/2007    Headache(784.0) 10/07/2007    Past Surgical History:  Procedure Laterality Date   APPENDECTOMY     BACK SURGERY  10-09   Dr. Patrice Paradise   BIOPSY  02/14/2020   Procedure: BIOPSY;  Surgeon: Gatha Mayer, MD;  Location: [facility];  Service: Endoscopy;;   CARPAL TUNNEL RELEASE Right 08/14/2014   Procedure: RIGHT CARPAL TUNNEL RELEASE AND INJECT LEFT THUMB;  Surgeon: Daryll Brod, MD;  Location: [facility];  Service: Orthopedics;  Laterality: Right;   CHOLECYSTECTOMY N/A 02/22/2020   Procedure: LAPAROSCOPIC CHOLECYSTECTOMY WITH INTRAOPERATIVE CHOLANGIOGRAM;  Surgeon: Kieth Brightly Arta Bruce, MD;  Location: [facility];  Service: General;  Laterality: N/A;   COLONOSCOPY     COLONOSCOPY WITH PROPOFOL N/A 02/14/2020   Procedure: COLONOSCOPY WITH PROPOFOL;  Surgeon: Gatha Mayer, MD;  Location: [facility];  Service: Endoscopy;  Laterality: N/A;   ENDOSCOPIC RETROGRADE CHOLANGIOPANCREATOGRAPHY (ERCP) WITH PROPOFOL N/A 12/25/2019   Procedure: ENDOSCOPIC RETROGRADE CHOLANGIOPANCREATOGRAPHY (ERCP) WITH PROPOFOL;  Surgeon: Gatha Mayer, MD;  Location: [facility];  Service: Gastroenterology;  Laterality: N/A;   ESOPHAGOGASTRODUODENOSCOPY (EGD) WITH PROPOFOL N/A 01/06/2020   Procedure: ESOPHAGOGASTRODUODENOSCOPY (EGD) WITH PROPOFOL;  Surgeon: Irene Shipper, MD;  Location: [facility];  Service: Endoscopy;  Laterality: N/A;   HOT HEMOSTASIS N/A 01/06/2020   Procedure: HOT HEMOSTASIS (ARGON PLASMA COAGULATION/BICAP);  Surgeon: Irene Shipper, MD;  Location: [facility] ENDOSCOPY;  Service: Endoscopy;  Laterality: N/A;   INTRAMEDULLARY (IM) NAIL INTERTROCHANTERIC Right 10/01/2018   Procedure: INTRAMEDULLARY (IM) NAIL INTERTROCHANTRIC;  Surgeon: [place] Park, MD;  Location: [facility] ORS;  Service: Orthopedics;  Laterality: Right;   LAPAROSCOPY N/A 02/21/2013   Procedure: LAPAROSCOPY OPERATIVE;  Surgeon: Margarette Asal, MD;  Location: [facility];  Service: Gynecology;  Laterality: N/A;  REQUESTING 5MM SCOPE WITH  CAMERA   LEFT HEART CATH AND CORONARY ANGIOGRAPHY N/A 05/11/2019   Procedure: LEFT HEART CATH AND CORONARY ANGIOGRAPHY;  Surgeon: Sherren Mocha, MD;  Location: [facility];  Service: Cardiovascular;  Laterality: N/A;   REMOVAL OF STONES  12/25/2019   Procedure: Karlyn Agee;  Surgeon: Gatha Mayer, MD;  Location: [facility] ENDOSCOPY;  Service: Gastroenterology;;   Clide Deutscher  01/06/2020   Procedure: Clide Deutscher;  Surgeon: Irene Shipper, MD;  Location: [facility] ENDOSCOPY;  Service: Endoscopy;;   SPHINCTEROTOMY  12/25/2019   Procedure: Joan Mayans;  Surgeon: Gatha Mayer, MD;  Location: [facility] ENDOSCOPY;  Service: Gastroenterology;;   TONSILLECTOMY     TUBAL LIGATION     UPPER GASTROINTESTINAL ENDOSCOPY       OB History   No obstetric history on file.     Family History  Problem Relation Age of Onset   Colon cancer Father    Prostate cancer Father    Heart disease Mother        prev MVR, also has DJD   Colon cancer Brother    Skin cancer Sister    Alcohol abuse Daughter    Bipolar disorder Daughter    Drug abuse Daughter    Esophageal cancer Neg Hx    Rectal cancer Neg Hx    Stomach cancer Neg Hx     Social History   Tobacco Use   Smoking status: Every Day    Packs/day: 1.00    Years: 30.00    Pack years: 30.00    Types: Cigarettes   Smokeless tobacco: Never  Vaping Use   Vaping Use: Never used  Substance Use Topics   Alcohol use: Yes    Alcohol/week: 3.0 standard drinks    Types: 3 Standard drinks or equivalent per week    Comment: occasional   Drug use: No    Home Medications Prior to Admission medications   Medication Sig Start Date End Date Taking? Authorizing Provider  albuterol (VENTOLIN HFA) 108 (90 Base) MCG/ACT inhaler Inhale 1-2 puffs into the lungs every 6 (six) hours as needed for wheezing or shortness of breath.   Yes [provider]  buPROPion (WELLBUTRIN SR) 150 MG 12 hr tablet Take 1 tablet (150 mg total) by mouth daily. 04/23/21  11/03/21 Yes Ravi, Himabindu, MD  busPIRone (BUSPAR) 10 MG tablet Take 10 mg by mouth daily. 10/29/21  Yes [provider]  cyclobenzaprine (FLEXERIL) 5 MG tablet Take 1 tablet (5 mg total) by mouth 3 (three) times daily as needed for muscle spasms. 06/04/21  Yes Cathlean Cower  W, MD  DULoxetine (CYMBALTA) 30 MG capsule Take 30 mg by mouth daily.   Yes [provider]  guaiFENesin (MUCINEX CHEST CONGESTION CHILD) 100 MG/5ML liquid Take 5 mLs by mouth every 4 (four) hours as needed for cough or to loosen phlegm.   Yes [provider]  hyoscyamine (ANASPAZ) 0.125 MG TBDP disintergrating tablet Place 1 tablet (0.125 mg total) under the tongue every 6 (six) hours as needed ([name] cramps). 04/09/21  Yes Gatha Mayer, MD  Melatonin 10 MG TABS Take 10 mg by mouth at bedtime as needed (sleep).   Yes [provider]  naloxone (NARCAN) nasal spray 4 mg/0.1 mL Place 1 spray into the nose as needed (overdose).   Yes [provider]  nicotine (NICODERM CQ - DOSED IN MG/24 HOURS) 21 mg/24hr patch Place 1 patch (21 mg total) onto the skin daily. 01/05/20  Yes Swayze, Ava, DO  nitroGLYCERIN (NITROSTAT) 0.4 MG SL tablet Place 1 tablet (0.4 mg total) under the tongue every 5 (five) minutes as needed for chest pain. 04/22/20  Yes Josue Hector, MD  Oxycodone HCl 10 MG TABS Take 1 tablet (10 mg total) by mouth 2 (two) times daily as needed. 06/24/21  Yes Thurnell Lose, MD  pantoprazole (PROTONIX) 40 MG tablet TAKE (1) TABLET TWICE A DAY BEFORE MEALS. Patient taking differently: Take 40 mg by mouth 2 (two) times daily before a meal. 01/29/21  Yes Gatha Mayer, MD  rosuvastatin (CRESTOR) 5 MG tablet Take 1 tablet (5 mg total) by mouth 3 (three) times a week. Please keep upcoming appt in January 2023 with Dr. Johnsie Cancel before anymore refills. Thank you Final Attempt 10/10/21  Yes Josue Hector, MD  sertraline (ZOLOFT) 100 MG tablet Take 2 tablets (200 mg total) by mouth at bedtime.  04/23/21 11/03/21 Yes Ravi, Himabindu, MD  valACYclovir (VALTREX) 500 MG tablet Take 500 mg by mouth 2 (two) times daily as needed (flare ups).   Yes [provider]  AMBULATORY NON FORMULARY MEDICATION Take 10 mg by mouth daily. Budesonide DR 5 mg capsules 10/23/21   Zehr, Janett Billow D, PA-C  budesonide (ENTOCORT EC) 3 MG 24 hr capsule TAKE 3 CAPSULES ONCE DAILY. Patient not taking: Reported on 11/03/2021 05/13/21   Gatha Mayer, MD  denosumab (PROLIA) 60 MG/ML SOSY injection Inject 60 mg into the skin every 6 (six) months. Patient not taking: Reported on 11/03/2021    [provider]  diclofenac Sodium (VOLTAREN) 1 % GEL Apply 2 g topically 4 (four) times daily as needed (for joint pain). Patient not taking: Reported on 11/03/2021 02/21/21   Elodia Florence., MD  doxycycline (VIBRA-TABS) 100 MG tablet Take 1 tablet (100 mg total) by mouth 2 (two) times daily. Patient not taking: Reported on 11/03/2021 10/16/21   Biagio Borg, MD  potassium chloride (KLOR-CON) 10 MEQ tablet Take 1 tablet (10 mEq total) by mouth daily. 05/17/21 10/21/21  Wyvonnia Dusky, MD  predniSONE (DELTASONE) 10 MG tablet 3 tabs by mouth per day for 3 days,2tabs per day for 3 days,1tab per day for 3 days Patient not taking: Reported on 11/03/2021 10/16/21   Biagio Borg, MD  spironolactone (ALDACTONE) 25 MG tablet Take 0.5 tablets (12.5 mg total) by mouth daily. Patient not taking: Reported on 11/03/2021 04/22/20   Josue Hector, MD    Allergies    Atorvastatin, Levofloxacin, Omnicef [cefdinir], Trazodone and nefazodone, Sulfa antibiotics, and Sulfonamide derivatives  Review of Systems  Review of Systems  Constitutional:  Positive for diaphoresis. Negative for chills and fever.  HENT: Negative.    Respiratory:  Positive for shortness of breath.   Cardiovascular:  Positive for chest pain.  Gastrointestinal:  Positive for nausea and vomiting. Negative for abdominal pain.  Genitourinary:  Negative for  dysuria.  Musculoskeletal:  Negative for arthralgias and myalgias.  Skin:  Negative for color change and rash.  Neurological:  Negative for syncope and light-headedness.  All other systems reviewed and are negative.  Physical Exam Updated Vital Signs BP 117/73   Pulse 87   Temp 98.3 F (36.8 C) (Oral)   Resp 19   Ht 5\' 1"  (1.549 m)   Wt 49.9 kg   SpO2 96%   BMI 20.78 kg/m   Physical Exam Vitals and nursing note reviewed.  Constitutional:      General: She is not in acute distress.    Appearance: Normal appearance. She is well-developed. She is ill-appearing. She is not diaphoretic.     Comments: Chronically ill-appearing, no acute distress  HENT:     Head: Normocephalic and atraumatic.  Eyes:     General:        Right eye: No discharge.        Left eye: No discharge.     Pupils: Pupils are equal, round, and reactive to light.  Cardiovascular:     Rate and Rhythm: Normal rate and regular rhythm.     Pulses: Normal pulses.          Radial pulses are 2+ on the right side and 2+ on the left side.       Dorsalis pedis pulses are 2+ on the right side and 2+ on the left side.     Heart sounds: Normal heart sounds. No murmur heard.   No friction rub. No gallop.  Pulmonary:     Effort: Pulmonary effort is normal. No respiratory distress.     Breath sounds: Decreased breath sounds and wheezing present. No rales.     Comments: On 2 L nasal cannula patient mildly tachypneic, but able to speak in full sentences, on auscultation she has decreased air movement throughout with some faint expiratory wheezing, frequent cough during exam. Abdominal:     General: Bowel sounds are normal. There is no distension.     Palpations: Abdomen is soft. There is no mass.     Tenderness: There is no abdominal tenderness. There is no guarding.     Comments: Abdomen soft, nondistended, nontender to palpation in all quadrants without guarding or peritoneal signs  Musculoskeletal:        General: No  deformity.     Cervical back: Neck supple.     Right lower leg: No tenderness. No edema.     Left lower leg: No tenderness. No edema.  Skin:    General: Skin is warm and dry.     Capillary Refill: Capillary refill takes less than 2 seconds.  Neurological:     Mental Status: She is alert and oriented to person, place, and time.     Coordination: Coordination normal.     Comments: Speech is clear, able to follow commands Moves extremities without ataxia, coordination intact  Psychiatric:        Mood and Affect: Mood normal.        Behavior: Behavior normal.    ED Results / Procedures / Treatments   Labs (all labs ordered are listed, but only abnormal results are displayed) Labs Reviewed  BASIC METABOLIC PANEL - Abnormal; Notable for the following components:      Result Value   BUN 5 (*)    Calcium 8.7 (*)    All other components within normal limits  TROPONIN I (HIGH SENSITIVITY) - Abnormal; Notable for the following components:   Troponin I (High Sensitivity) 111 (*)    All other components within normal limits  TROPONIN I (HIGH SENSITIVITY) - Abnormal; Notable for the following components:   Troponin I (High Sensitivity) 269 (*)    All other components within normal limits  RESP PANEL BY RT-PCR (FLU A&B, COVID) ARPGX2  BRAIN NATRIURETIC PEPTIDE  CBC WITH DIFFERENTIAL/PLATELET  HEPARIN LEVEL (UNFRACTIONATED)    EKG EKG Interpretation  Date/Time:  Monday November 03 2021 14:47:15 EST Ventricular Rate:  91 PR Interval:  146 QRS Duration: 97 QT Interval:  369 QTC Calculation: 454 R Axis:   88 Text Interpretation: Sinus rhythm Borderline right axis deviation Abnrm T, consider ischemia, anterolateral lds Since last tracing of earlier today No significant change was found Confirmed by Daleen Bo [phone]) on 11/03/2021 3:02:10 PM  Radiology DG Chest 2 View  Result Date: 11/03/2021 CLINICAL DATA:  70 year old female with chest pain, cough and shortness of breath onset  this morning. EXAM: CHEST - 2 VIEW COMPARISON:  Chest CTA 06/22/2021. Radiographs 10/16/2021 and earlier. FINDINGS: Chronic scoliosis and partially visible lumbar fusion hardware. Chronic midthoracic compression fracture and osteopenia. No acute osseous abnormality identified. Stable large lung volumes. Mediastinal contours remain normal. Visualized tracheal air column is within normal limits. Stable mildly increased bilateral lung markings. Both lungs otherwise appear clear. No pneumothorax or pleural effusion. Calcified aortic atherosclerosis.  Stable cholecystectomy clips. IMPRESSION: Chronic emphysema.  No acute cardiopulmonary abnormality. Electronically Signed   By: Genevie Ann M.D.   On: 11/03/2021 11:07    Procedures .Critical Care Performed by: Jacqlyn Larsen, PA-C Authorized by: Jacqlyn Larsen, PA-C   Critical care provider statement:    Critical care time (minutes):  45   Critical care time was exclusive of:  Separately billable procedures and treating other patients and teaching time   Critical care was necessary to treat or prevent imminent or life-threatening deterioration of the following conditions:  Cardiac failure   Critical care was time spent personally by me on the following activities:  Blood draw for specimens, development of treatment plan with patient or surrogate, discussions with consultants, discussions with primary provider, evaluation of patient's response to treatment, examination of patient, obtaining history from patient or surrogate, review of old charts, re-evaluation of patient's condition, pulse oximetry, ordering and review of radiographic studies, ordering and review of laboratory studies and ordering and performing treatments and interventions   Care discussed with: admitting provider     Medications Ordered in ED Medications  ipratropium-albuterol (DUONEB) 0.5-2.5 (3) MG/3ML nebulizer solution 3 mL (3 mLs Nebulization Given 11/03/21 1543)  heparin ADULT infusion  100 units/mL (25000 units/257mL) (has no administration in time range)  heparin bolus via infusion 3,000 Units (has no administration in time range)  methylPREDNISolone sodium succinate (SOLU-MEDROL) 125 mg/2 mL injection 125 mg (125 mg Intravenous Given 11/03/21 1543)    ED Course  I have reviewed the triage vital signs and the nursing notes.  Pertinent labs & imaging results that were available during my care of the patient were reviewed by me and considered in my medical decision making (see chart for details).   MDM Rules/Calculators/A&P  70 year old female presents for evaluation of chest pain and shortness of breath.  Symptoms started when she woke up this morning after walking to the bathroom she had chest heaviness rating into her back and down her left arm associated with diaphoresis and worsening shortness of breath.  Took 4 nitro and was given aspirin with EMS which seemed to improve her pain.  She does have known history of multivessel disease that was mild to moderate on cath 2 years ago.  Followed by Dr. Johnsie Cancel with cardiology.  Pain today is not pleuritic, not ripping or tearing in nature, lower suspicion for PE or dissection.  Story is concerning for potential ACS.  Especially with pain being improved with nitro and aspirin.  She is also had worsening bronchitis recently we will treat with Solu-Medrol and DuoNeb to help with cough and shortness of breath.  Patient initially arrived on her home O2 that she usually only wears at night, but this was removed and with patient's chest pain improving she is satting at 94-95% on room air, the rest of her vitals are stable.  I have independently ordered, reviewed and interpreted all labs and imaging:  EKG: Sinus rhythm with some nonspecific ST changes, no evidence of STEMI  CBC: No leukocytosis, normal hemoglobin BMP: No significant electrolyte derangements aside from very mild hypocalcemia, normal renal  function Troponin: Initial troponin mildly elevated at 111, it looks like patient's baseline troponin is between 80-90.  Will repeat EKG and check delta troponin.  Chest x-ray: Chronic emphysema with no acute cardiopulmonary disease.  COVID/flu: Negative  Case discussed with cardiology, they will see and evaluate the patient.  She is currently chest pain-free with stable vitals, will hold off on starting heparin until discussing with cardiology and seeing delta troponin.  Delta troponin increasing to 269.  Discussed with cardiology APP, they have seen and evaluated the patient and will discuss with cardiology attending.  At this point presentation is consistent with NSTEMI.  Patient started on heparin.  Cardiology team will see and admit the patient and plan for heart cath tomorrow.  Final Clinical Impression(s) / ED Diagnoses Final diagnoses:  NSTEMI (non-ST elevated myocardial infarction) [facility])    Rx / [place] Orders ED Discharge Orders     None        Janet Berlin 11/03/21 Ivar Bury, MD 11/03/21 2053

## 2021-11-03 NOTE — Progress Notes (Signed)
Pt arrived to the unit with medications from home, pt stated that she did not receive her medication in a timely manner when she was hospitalized previously. Pt educated that while she is hospitalized it is important to follow treatment regimen that is prescribed. Pt agreed to surrender all medications to pharmacy. Pt also assessed to have a nicotine patch on that she stated was placed earlier this morning. Pt stated that her chest pain started after she applied the patch. Nipp, MD made aware & awaiting further orders.   Elaina Hoops, RN

## 2021-11-03 NOTE — Progress Notes (Signed)
ANTICOAGULATION CONSULT NOTE - Initial Consult  Pharmacy Consult for heparin Indication: chest pain/ACS  Allergies  Allergen Reactions   Atorvastatin Nausea And Vomiting   Levofloxacin Other (See Comments)    Achilles tendon pain   Omnicef [Cefdinir] Diarrhea and Other (See Comments)    Uncontrollable diarrhea   Trazodone And Nefazodone Other (See Comments)    "Felt like I was going to faint"   Sulfa Antibiotics Rash   Sulfonamide Derivatives Rash    Patient Measurements: Height: 5\' 1"  (154.9 cm) Weight: 49.9 kg (110 lb) IBW/kg (Calculated) : 47.8 Heparin Dosing Weight: 49.9 kg   Vital Signs: Temp: 98.3 F (36.8 C) (11/07 1126) Temp Source: Oral (11/07 1126) BP: 117/73 (11/07 1430) Pulse Rate: 87 (11/07 1430)  Labs: Recent Labs    11/03/21 1040 11/03/21 1414  HGB 12.5  --   HCT 39.3  --   PLT 271  --   CREATININE 0.80  --   TROPONINIHS 111* 269*    Estimated Creatinine Clearance: 50.8 mL/min (by C-G formula based on SCr of 0.8 mg/dL).   Medical History: Past Medical History:  Diagnosis Date   Acute cystitis    Acute respiratory failure ([facility])    Allergy    as a child grew out of them   Anemia    pernicious anemia   Anxiety    Arthritis    Back pain    Blood transfusion    CAP (community acquired pneumonia)    CHF (congestive heart failure) (HCC)    Chronic pain syndrome    Common bile duct stone    Depression    Diverticulosis of colon    Duodenal ulcer hemorrhagic-after biliary sphincterotomy    Dysthymia    Esophagitis    EtOH dependence ([place])    Gastritis    GERD (gastroesophageal reflux disease)    H/O chest pain Dec. 2013   no work up done   Hx of colonic polyps    Hypertension    Irritable bowel syndrome    Lymphocytic colitis 02/16/2020   Osteopenia    Osteoporosis    Pneumonia 2019   Polypharmacy    Reflux esophagitis    Right sided sciatica 12/22/2017   Tobacco use disorder    Unspecified chronic bronchitis (HCC)    Vertigo     Vitamin B12 deficiency    Wears glasses     Medications:  (Not in a hospital admission)   Assessment: 70 YOF who presents with chest pain. Troponins elevated and up trending. Pharmacy consulted to start IV heparin for ACS/NSTEMI.   H/H and Plt wnl.   Goal of Therapy:  Heparin level 0.3-0.7 units/ml Monitor platelets by anticoagulation protocol: Yes   Plan:  -Heparin 3000 units IV bolus followed by heparin infusion at 600 units/hr -F/u 6 hr anti-Xa level -Monitor daily anti-Xa level, CBC and s/s of bleeding  Albertina Parr, PharmD., BCPS, BCCCP Clinical Pharmacist Please refer to [facility] for unit-specific pharmacist

## 2021-11-03 NOTE — ED Triage Notes (Signed)
EMS stated she has chest pain with some SOB started about 2 hours ago when getting up to go to [employer].

## 2021-11-03 NOTE — ED Provider Notes (Signed)
Emergency Medicine Provider Triage Evaluation Note  KINSLEY HOLDERMAN , a 70 y.o. female  was evaluated in triage.  Pt complains of chest pain and shortness of breath.  Symptoms started when patient woke up this morning after she went to the bathroom started to notice a burning in her chest and shortness of breath.  Typically only wears oxygen at night but felt like she needed to continue wearing her oxygen today.  Has not had a cough and saw her doctor last week and was diagnosed with bronchitis but reports this has been worsening without fever.  Chest pain is constant, patient has underlying history of CHF.  Review of Systems  Positive: Chest pain, shortness of breath, cough Negative: Fever, lower extremity swelling  Physical Exam  BP 112/84   Pulse 87   Temp 98.1 F (36.7 C) (Oral)   Resp 18   SpO2 97%  Gen:   Awake, no distress, chronically ill-appearing Resp:  Normal effort on home O2, breath sounds decreased throughout with some faint wheezing, frequent cough during exam MSK:   Moves extremities without difficulty  Other:    Medical Decision Making  Medically screening exam initiated at 10:41 AM.  Appropriate orders placed.  ATHANASIA STANWOOD was informed that the remainder of the evaluation will be completed by another provider, this initial triage assessment does not replace that evaluation, and the importance of remaining in the ED until their evaluation is complete.     Jacqlyn Larsen, PA-C 11/03/21 1043    Daleen Bo, MD 11/03/21 2050

## 2021-11-04 ENCOUNTER — Encounter (HOSPITAL_COMMUNITY)
Admission: EM | Disposition: A | Discharge status: Home / Self Care | Payer: Self-pay | Source: Home / Self Care | Attending: Emergency Medicine

## 2021-11-04 ENCOUNTER — Encounter (HOSPITAL_COMMUNITY): Payer: Self-pay | Admitting: Cardiology

## 2021-11-04 DIAGNOSIS — R69 Illness, unspecified: Secondary | ICD-10-CM | POA: Diagnosis not present

## 2021-11-04 DIAGNOSIS — J449 Chronic obstructive pulmonary disease, unspecified: Secondary | ICD-10-CM | POA: Diagnosis not present

## 2021-11-04 DIAGNOSIS — I214 Non-ST elevation (NSTEMI) myocardial infarction: Secondary | ICD-10-CM | POA: Diagnosis not present

## 2021-11-04 DIAGNOSIS — Z9981 Dependence on supplemental oxygen: Secondary | ICD-10-CM | POA: Diagnosis not present

## 2021-11-04 DIAGNOSIS — I5089 Other heart failure: Secondary | ICD-10-CM | POA: Diagnosis not present

## 2021-11-04 DIAGNOSIS — E785 Hyperlipidemia, unspecified: Secondary | ICD-10-CM | POA: Diagnosis not present

## 2021-11-04 DIAGNOSIS — Z79899 Other long term (current) drug therapy: Secondary | ICD-10-CM | POA: Diagnosis not present

## 2021-11-04 DIAGNOSIS — I2510 Atherosclerotic heart disease of native coronary artery without angina pectoris: Secondary | ICD-10-CM | POA: Diagnosis not present

## 2021-11-04 DIAGNOSIS — Z720 Tobacco use: Secondary | ICD-10-CM | POA: Diagnosis not present

## 2021-11-04 DIAGNOSIS — I110 Hypertensive heart disease with heart failure: Secondary | ICD-10-CM | POA: Diagnosis not present

## 2021-11-04 DIAGNOSIS — F17210 Nicotine dependence, cigarettes, uncomplicated: Secondary | ICD-10-CM | POA: Diagnosis not present

## 2021-11-04 DIAGNOSIS — Z20822 Contact with and (suspected) exposure to covid-19: Secondary | ICD-10-CM | POA: Diagnosis not present

## 2021-11-04 HISTORY — PX: LEFT HEART CATH AND CORONARY ANGIOGRAPHY: CATH118249

## 2021-11-04 LAB — BASIC METABOLIC PANEL
Anion gap: 10 (ref 5–15)
BUN: 8 mg/dL (ref 8–23)
CO2: 28 mmol/L (ref 22–32)
Calcium: 8.7 mg/dL — ABNORMAL LOW (ref 8.9–10.3)
Chloride: 99 mmol/L (ref 98–111)
Creatinine, Ser: 0.88 mg/dL (ref 0.44–1.00)
GFR, Estimated: >60 mL/min (ref >60)
Glucose, Bld: 175 mg/dL — ABNORMAL HIGH (ref 70–99)
Potassium: 3.7 mmol/L (ref 3.5–5.1)
Sodium: 137 mmol/L (ref 135–145)

## 2021-11-04 LAB — CBC
HCT: 35.9 % — ABNORMAL LOW (ref 36.0–46.0)
Hemoglobin: 11.7 g/dL — ABNORMAL LOW (ref 12.0–15.0)
MCH: 31.8 pg (ref 26.0–34.0)
MCHC: 32.6 g/dL (ref 30.0–36.0)
MCV: 97.6 fL (ref 80.0–100.0)
Platelets: 224 10*3/uL (ref 150–400)
RBC: 3.68 MIL/uL — ABNORMAL LOW (ref 3.87–5.11)
RDW: 12.3 % (ref 11.5–15.5)
WBC: 4.6 10*3/uL (ref 4.0–10.5)
nRBC: 0 % (ref 0.0–0.2)

## 2021-11-04 LAB — LIPID PANEL
Cholesterol: 198 mg/dL (ref 0–200)
HDL: 105 mg/dL (ref >40)
LDL Cholesterol: 84 mg/dL (ref 0–99)
Total CHOL/HDL Ratio: 1.9 RATIO
Triglycerides: 44 mg/dL (ref <150)
VLDL: 9 mg/dL (ref 0–40)

## 2021-11-04 LAB — TROPONIN I (HIGH SENSITIVITY): Troponin I (High Sensitivity): 146 ng/L (ref <18)

## 2021-11-04 LAB — HEPARIN LEVEL (UNFRACTIONATED): Heparin Unfractionated: 0.53 IU/mL (ref 0.30–0.70)

## 2021-11-04 SURGERY — LEFT HEART CATH AND CORONARY ANGIOGRAPHY
Anesthesia: LOCAL

## 2021-11-04 MED ORDER — HEPARIN SODIUM (PORCINE) 1000 UNIT/ML IJ SOLN
INTRAMUSCULAR | Status: DC | PRN
  Administered 2021-11-04: 3000 [IU] via INTRAVENOUS

## 2021-11-04 MED ORDER — HEPARIN SODIUM (PORCINE) 1000 UNIT/ML IJ SOLN
INTRAMUSCULAR | Status: AC
  Filled 2021-11-04: qty 1

## 2021-11-04 MED ORDER — MIDAZOLAM HCL 2 MG/2ML IJ SOLN
INTRAMUSCULAR | Status: AC
  Filled 2021-11-04: qty 2

## 2021-11-04 MED ORDER — ROSUVASTATIN CALCIUM 20 MG PO TABS
20.0000 mg | ORAL_TABLET | Freq: Every day | ORAL | Status: DC
  Administered 2021-11-04 – 2021-11-05 (×2): 20 mg via ORAL
  Filled 2021-11-04 (×2): qty 1

## 2021-11-04 MED ORDER — VERAPAMIL HCL 2.5 MG/ML IV SOLN
INTRAVENOUS | Status: DC | PRN
  Administered 2021-11-04: 10 mL via INTRA_ARTERIAL

## 2021-11-04 MED ORDER — FENTANYL CITRATE (PF) 100 MCG/2ML IJ SOLN
INTRAMUSCULAR | Status: DC | PRN
  Administered 2021-11-04 (×3): 25 ug via INTRAVENOUS

## 2021-11-04 MED ORDER — HEPARIN (PORCINE) IN NACL 1000-0.9 UT/500ML-% IV SOLN
INTRAVENOUS | Status: AC
  Filled 2021-11-04: qty 1000

## 2021-11-04 MED ORDER — IOHEXOL 350 MG/ML SOLN
INTRAVENOUS | Status: DC | PRN
  Administered 2021-11-04: 70 mL

## 2021-11-04 MED ORDER — VERAPAMIL HCL 2.5 MG/ML IV SOLN
INTRAVENOUS | Status: AC
  Filled 2021-11-04: qty 2

## 2021-11-04 MED ORDER — FENTANYL CITRATE (PF) 100 MCG/2ML IJ SOLN
INTRAMUSCULAR | Status: AC
  Filled 2021-11-04: qty 2

## 2021-11-04 MED ORDER — LIDOCAINE HCL (PF) 1 % IJ SOLN
INTRAMUSCULAR | Status: DC | PRN
  Administered 2021-11-04 (×2): 2 mL

## 2021-11-04 MED ORDER — MIDAZOLAM HCL 2 MG/2ML IJ SOLN
INTRAMUSCULAR | Status: DC | PRN
  Administered 2021-11-04: 1 mg via INTRAVENOUS
  Administered 2021-11-04: 2 mg via INTRAVENOUS

## 2021-11-04 MED ORDER — HEPARIN (PORCINE) IN NACL 1000-0.9 UT/500ML-% IV SOLN
INTRAVENOUS | Status: DC | PRN
  Administered 2021-11-04 (×2): 500 mL

## 2021-11-04 MED ORDER — ENOXAPARIN SODIUM 40 MG/0.4ML IJ SOSY
40.0000 mg | PREFILLED_SYRINGE | INTRAMUSCULAR | Status: DC
  Administered 2021-11-05: 40 mg via SUBCUTANEOUS
  Filled 2021-11-04: qty 0.4

## 2021-11-04 MED ORDER — SODIUM CHLORIDE 0.9 % IV SOLN: 250.0000 mL | INTRAVENOUS | Status: DC | PRN

## 2021-11-04 MED ORDER — SODIUM CHLORIDE 0.9% FLUSH
3.0000 mL | Freq: Two times a day (BID) | INTRAVENOUS | Status: DC
  Administered 2021-11-05: 3 mL via INTRAVENOUS

## 2021-11-04 MED ORDER — SODIUM CHLORIDE 0.9 % WEIGHT BASED INFUSION
1.0000 mL/kg/h | INTRAVENOUS | Status: AC
  Administered 2021-11-04: 1 mL/kg/h via INTRAVENOUS

## 2021-11-04 MED ORDER — SODIUM CHLORIDE 0.9% FLUSH: 3.0000 mL | INTRAVENOUS | Status: DC | PRN

## 2021-11-04 MED ORDER — LIDOCAINE HCL (PF) 1 % IJ SOLN
INTRAMUSCULAR | Status: AC
  Filled 2021-11-04: qty 30

## 2021-11-04 SURGICAL SUPPLY — 10 items
CATH 5FR JL3.5 JR4 ANG PIG MP (CATHETERS) ×2 IMPLANT
DEVICE RAD TR BAND REGULAR (VASCULAR PRODUCTS) ×2 IMPLANT
GLIDESHEATH SLEND SS 6F .021 (SHEATH) ×2 IMPLANT
GUIDEWIRE INQWIRE 1.5J.035X260 (WIRE) IMPLANT
INQWIRE 1.5J .035X260CM (WIRE) ×3
KIT HEART LEFT (KITS) ×3 IMPLANT
PACK CARDIAC CATHETERIZATION (CUSTOM PROCEDURE TRAY) ×3 IMPLANT
SYR MEDRAD MARK 7 150ML (SYRINGE) ×3 IMPLANT
TRANSDUCER W/STOPCOCK (MISCELLANEOUS) ×3 IMPLANT
TUBING CIL FLEX 10 FLL-RA (TUBING) ×3 IMPLANT

## 2021-11-04 NOTE — Care Management Obs Status (Signed)
[facility] NOTIFICATION   Patient Details  Name: Denise King MRN: [id number] Date of Birth: 01/02/1951   Medicare Observation Status Notification Given:  Yes    Bethena Roys, RN 11/04/2021, 4:22 PM

## 2021-11-04 NOTE — Interval H&P Note (Signed)
History and Physical Interval Note:  11/04/2021 11:17 AM  Denise King  has presented today for surgery, with the diagnosis of nstemi.  The various methods of treatment have been discussed with the patient and family. After consideration of risks, benefits and other options for treatment, the patient has consented to  Procedure(s): LEFT HEART CATH AND CORONARY ANGIOGRAPHY (N/A) as a surgical intervention.  The patient's history has been reviewed, patient examined, no change in status, stable for surgery.  I have reviewed the patient's chart and labs.  Questions were answered to the patient's satisfaction.   Cath Lab Visit (complete for each Cath Lab visit)  Clinical Evaluation Leading to the Procedure:   ACS: Yes.    Non-ACS:    Anginal Classification: CCS III  Anti-ischemic medical therapy: No Therapy  Non-Invasive Test Results: No non-invasive testing performed  Prior CABG: No previous CABG        [name] [facility] 11/04/2021 11:17 AM

## 2021-11-04 NOTE — Progress Notes (Signed)
Denise King for heparin Indication: chest pain/ACS  Allergies  Allergen Reactions   Atorvastatin Nausea And Vomiting   Levofloxacin Other (See Comments)    Achilles tendon pain   Omnicef [Cefdinir] Diarrhea and Other (See Comments)    Uncontrollable diarrhea   Trazodone And Nefazodone Other (See Comments)    "Felt like I was going to faint"   Sulfa Antibiotics Rash   Sulfonamide Derivatives Rash    Patient Measurements: Height: 5\' 1"  (154.9 cm) Weight: 49.7 kg (109 lb 8 oz) IBW/kg (Calculated) : 47.8 Heparin Dosing Weight: 49.9 kg   Vital Signs: Temp: 98 F (36.7 C) (11/08 0352) Temp Source: Oral (11/08 0352) BP: 119/82 (11/08 0352) Pulse Rate: 103 (11/08 0352)  Labs: Recent Labs    11/03/21 1040 11/03/21 1414 11/03/21 2215 11/04/21 0129  HGB 12.5  --   --  11.7*  HCT 39.3  --   --  35.9*  PLT 271  --   --  224  HEPARINUNFRC  --   --  0.28* 0.53  CREATININE 0.80  --   --  0.88  TROPONINIHS 111* 269* 176* 146*     Estimated Creatinine Clearance: 46.2 mL/min (by C-G formula based on SCr of 0.88 mg/dL).   Medical History: Past Medical History:  Diagnosis Date   Acute cystitis    Acute respiratory failure ([place])    Allergy    as a child grew out of them   Anemia    pernicious anemia   Anxiety    Arthritis    Back pain    Blood transfusion    CAP (community acquired pneumonia)    CHF (congestive heart failure) (HCC)    Chronic pain syndrome    Common bile duct stone    Depression    Diverticulosis of colon    Duodenal ulcer hemorrhagic-after biliary sphincterotomy    Dysthymia    Esophagitis    EtOH dependence ([place])    Gastritis    GERD (gastroesophageal reflux disease)    H/O chest pain Dec. 2013   no work up done   Hx of colonic polyps    Hypertension    Irritable bowel syndrome    Lymphocytic colitis 02/16/2020   Osteopenia    Osteoporosis    Pneumonia 2019   Polypharmacy    Reflux esophagitis     Right sided sciatica 12/22/2017   Tobacco use disorder    Unspecified chronic bronchitis (H[facility])    Vertigo    Vitamin B12 deficiency    Wears glasses     Medications:  Medications Prior to Admission  Medication Sig Dispense Refill Last Dose   albuterol (VENTOLIN HFA) 108 (90 Base) MCG/ACT inhaler Inhale 1-2 puffs into the lungs every 6 (six) hours as needed for wheezing or shortness of breath.   11/02/2021   buPROPion (WELLBUTRIN SR) 150 MG 12 hr tablet Take 1 tablet (150 mg total) by mouth daily. 90 tablet 1 11/02/2021   busPIRone (BUSPAR) 10 MG tablet Take 10 mg by mouth daily.   11/02/2021   cyclobenzaprine (FLEXERIL) 5 MG tablet Take 1 tablet (5 mg total) by mouth 3 (three) times daily as needed for muscle spasms. 60 tablet 2 Past Month   DULoxetine (CYMBALTA) 30 MG capsule Take 30 mg by mouth daily.   11/02/2021   guaiFENesin (MUCINEX CHEST CONGESTION CHILD) 100 MG/5ML liquid Take 5 mLs by mouth every 4 (four) hours as needed for cough or to loosen phlegm.  11/02/2021   hyoscyamine (ANASPAZ) 0.125 MG TBDP disintergrating tablet Place 1 tablet (0.125 mg total) under the tongue every 6 (six) hours as needed ([name] cramps). 90 tablet 3 11/02/2021   Melatonin 10 MG TABS Take 10 mg by mouth at bedtime as needed (sleep).   11/02/2021   naloxone (NARCAN) nasal spray 4 mg/0.1 mL Place 1 spray into the nose as needed (overdose).   unk   nicotine (NICODERM CQ - DOSED IN MG/24 HOURS) 21 mg/24hr patch Place 1 patch (21 mg total) onto the skin daily. 28 patch 0 Past Month   nitroGLYCERIN (NITROSTAT) 0.4 MG SL tablet Place 1 tablet (0.4 mg total) under the tongue every 5 (five) minutes as needed for chest pain. 25 tablet 3 11/03/2021   Oxycodone HCl 10 MG TABS Take 1 tablet (10 mg total) by mouth 2 (two) times daily as needed. 1 tablet 0 11/03/2021   pantoprazole (PROTONIX) 40 MG tablet TAKE (1) TABLET TWICE A DAY BEFORE MEALS. (Patient taking differently: Take 40 mg by mouth 2 (two) times daily before a meal.)  60 tablet 11 11/02/2021   rosuvastatin (CRESTOR) 5 MG tablet Take 1 tablet (5 mg total) by mouth 3 (three) times a week. Please keep upcoming appt in January 2023 with Dr. Johnsie Cancel before anymore refills. Thank you Final Attempt 45 tablet 1 11/02/2021   sertraline (ZOLOFT) 100 MG tablet Take 2 tablets (200 mg total) by mouth at bedtime. 180 tablet 1 11/02/2021   valACYclovir (VALTREX) 500 MG tablet Take 500 mg by mouth 2 (two) times daily as needed (flare ups).   unk   AMBULATORY NON FORMULARY MEDICATION Take 10 mg by mouth daily. Budesonide DR 5 mg capsules 60 capsule 2    budesonide (ENTOCORT EC) 3 MG 24 hr capsule TAKE 3 CAPSULES ONCE DAILY. (Patient not taking: Reported on 11/03/2021) 90 capsule 1 Not Taking   denosumab (PROLIA) 60 MG/ML SOSY injection Inject 60 mg into the skin every 6 (six) months. (Patient not taking: Reported on 11/03/2021)   Not Taking   diclofenac Sodium (VOLTAREN) 1 % GEL Apply 2 g topically 4 (four) times daily as needed (for joint pain). (Patient not taking: Reported on 11/03/2021)   Not Taking   potassium chloride (KLOR-CON) 10 MEQ tablet Take 1 tablet (10 mEq total) by mouth daily. 30 tablet 0     Assessment: 20 YOF who presents with chest pain. Troponins elevated and up trending. Pharmacy consulted to start IV heparin for ACS/NSTEMI. Cath planned for 11/04/21.  Heparin level therapeutic this morning at 0.53 on 700 units/hr. Hgb trending down but stable. No s/sx of bleeding noted.   Goal of Therapy:  Heparin level 0.3-0.7 units/ml Monitor platelets by anticoagulation protocol: Yes   Plan:  -Continue heparin gtt at 700 units/hr -Monitor heparin level, CBC, and s/sx of bleeding daily   [name], Pharm.D. PGY-1 Pharmacy Resident [phone] 11/04/2021 7:36 AM

## 2021-11-04 NOTE — Care Management Obs Status (Deleted)
[facility] [address] NOTIFICATION   Patient Details  Name: Denise King MRN: [id number] Date of Birth: 02-04-1951   Medicare Observation Status Notification Given:       Bethena Roys, RN 11/04/2021, 4:22 PM

## 2021-11-04 NOTE — Progress Notes (Addendum)
Progress Note  Patient Name: Denise King Date of Encounter: 11/04/2021  [facility] Cardiologist: Jenkins Rouge, MD   Subjective   No chest pain, c/o nonproductive cough. Planned for cardiac cath today.   Inpatient Medications    Scheduled Meds:  aspirin EC  81 mg Oral Daily   budesonide  3 mg Oral Daily   buPROPion  150 mg Oral Daily   busPIRone  10 mg Oral Daily   DULoxetine  30 mg Oral Daily   ipratropium-albuterol  3 mL Nebulization TID   nicotine  21 mg Transdermal Daily   pantoprazole  40 mg Oral BID AC   [START ON 11/05/2021] rosuvastatin  5 mg Oral Once per day on Mon Wed Fri   sertraline  200 mg Oral QHS   sodium chloride flush  3 mL Intravenous Q12H   Continuous Infusions:  sodium chloride     sodium chloride 1 mL/kg/hr (11/04/21 0506)   heparin 700 Units/hr (11/04/21 0630)   PRN Meds: sodium chloride, acetaminophen, albuterol, alum & mag hydroxide-simeth, guaiFENesin, melatonin, nitroGLYCERIN, ondansetron (ZOFRAN) IV, oxyCODONE, sodium chloride flush   Vital Signs    Vitals:   11/03/21 2043 11/03/21 2305 11/04/21 0032 11/04/21 0352  BP: (!) 153/85  132/82 119/82  Pulse: 100  (!) 104 (!) 103  Resp: 20   20  Temp: 98.4 F (36.9 C)  98.3 F (36.8 C) 98 F (36.7 C)  TempSrc: Oral  Oral Oral  SpO2:  100% 91% 96%  Weight:      Height:        Intake/Output Summary (Last 24 hours) at 11/04/2021 0805 Last data filed at 11/04/2021 0630 Gross per 24 hour  Intake 489.59 ml  Output --  Net 489.59 ml   Last 3 Weights 11/03/2021 11/03/2021 10/21/2021  Weight (lbs) 109 lb 8 oz 110 lb 107 lb 4 oz  Weight (kg) 49.669 kg 49.896 kg 48.648 kg  Some encounter information is confidential and restricted. Go to Review Flowsheets activity to see all data.      Telemetry    SR-->ST  - Personally Reviewed  ECG    SR with TWI in anteroseptal leads - Personally Reviewed  Physical Exam   GEN: No acute distress.   Neck: No JVD Cardiac: RRR, no murmurs,  rubs, or gallops.  Respiratory: Tight, expiratory wheezing  GI: Soft, nontender, non-distended  MS: No edema; No deformity. Neuro:  Nonfocal  Psych: Normal affect   Labs    High Sensitivity Troponin:   Recent Labs  Lab 11/03/21 1040 11/03/21 1414 11/03/21 2215 11/04/21 0129  TROPONINIHS 111* 269* 176* 146*     Chemistry Recent Labs  Lab 11/03/21 1040 11/04/21 0129  NA 139 137  K 3.6 3.7  CL 103 99  CO2 28 28  GLUCOSE 96 175*  BUN 5* 8  CREATININE 0.80 0.88  CALCIUM 8.7* 8.7*  GFRNONAA >60 >60  ANIONGAP 8 10    Lipids  Recent Labs  Lab 11/04/21 0129  CHOL 198  TRIG 44  HDL 105  LDLCALC 84  CHOLHDL 1.9    Hematology Recent Labs  Lab 11/03/21 1040 11/04/21 0129  WBC 8.0 4.6  RBC 3.97 3.68*  HGB 12.5 11.7*  HCT 39.3 35.9*  MCV 99.0 97.6  MCH 31.5 31.8  MCHC 31.8 32.6  RDW 12.6 12.3  PLT 271 224   Thyroid No results for input(s): TSH, FREET4 in the last 168 hours.  BNP Recent Labs  Lab 11/03/21 1040  BNP 60.8    DDimer No results for input(s): DDIMER in the last 168 hours.   Radiology    DG Chest 2 View  Result Date: 11/03/2021 CLINICAL DATA:  70 year old female with chest pain, cough and shortness of breath onset this morning. EXAM: CHEST - 2 VIEW COMPARISON:  Chest CTA 06/22/2021. Radiographs 10/16/2021 and earlier. FINDINGS: Chronic scoliosis and partially visible lumbar fusion hardware. Chronic midthoracic compression fracture and osteopenia. No acute osseous abnormality identified. Stable large lung volumes. Mediastinal contours remain normal. Visualized tracheal air column is within normal limits. Stable mildly increased bilateral lung markings. Both lungs otherwise appear clear. No pneumothorax or pleural effusion. Calcified aortic atherosclerosis.  Stable cholecystectomy clips. IMPRESSION: Chronic emphysema.  No acute cardiopulmonary abnormality. Electronically Signed   By: Genevie Ann M.D.   On: 11/03/2021 11:07    Cardiac Studies    N/a  Patient Profile     70 y.o. female with a hx of mild to moderate CAD, COPD with ongoing tobacco smoking, hypertension, hyperlipidemia and lymphocytic colitis with diarrhea who was seen 11/03/2021 for the evaluation of chest pain at the request of Dr. Eulis Foster.  Assessment & Plan    NSTEMI with hx of nonobstructive CAD'20: hsTn peaked at 269, planned for cardiac cath this morning. EKG showed anteroseptal T wave inversion on admission.  -- on ASA, statin, IV heparin -- will avoid BB use for now with wheezing on exam, underlying COPD/emphysema   HTN: blood pressures are stable.   HLD: LDL 84 -- on crestor 5mg  daily, no issues with myalgias  -- will further increase crestor to 20mg  daily   Recent Bronchitis with COPD: treated with recent antibiotic and steroid taper. Still with non-productive cough, wheezing on exam -- continue albuterol, robitussin  -- seen by pulmonary 7/22, pending PFTs. Suspect she would benefit from maintenance dose inhaler   Tobacco use: continues to smoke  For questions or updates, please contact [place] Please consult [email or web address] for contact info under   Signed, Reino Bellis, NP  11/04/2021, 8:05 AM    Patient seen, examined. Available data reviewed. Agree with findings, assessment, and plan as outlined by Reino Bellis, NP.  On exam, the patient is alert, oriented, no distress.  HEENT is normal, JVP is normal, lungs are clear, heart is regular rate and rhythm with no murmur gallop, abdomen soft nontender, extremities have no edema.  The patient has had no further chest discomfort on IV heparin overnight.  We plan to proceed with cardiac catheterization and possible PCI today as previously outlined.  Risks, indications, and alternatives to cardiac catheterization/PTCA/stenting have been reviewed with the patient and she agrees to proceed.  Otherwise as outlined above.  Sherren Mocha, M.D. 11/04/2021 9:42 AM

## 2021-11-04 NOTE — Progress Notes (Signed)
Made Dr. Burt Knack aware while a the bedside of several times of trying to deflate right radial TR band firm hematoma forming below TR band. MD assessed and readjusted band. No signs of hematoma, and able to deflate 3 cc of air. Patient tolerated with no signs of distress noted. Will continue to monitor.

## 2021-11-04 NOTE — H&P (View-Only) (Signed)
Progress Note  Patient Name: Denise King Date of Encounter: 11/04/2021  [facility] Cardiologist: Jenkins Rouge, MD   Subjective   No chest pain, c/o nonproductive cough. Planned for cardiac cath today.   Inpatient Medications    Scheduled Meds:  aspirin EC  81 mg Oral Daily   budesonide  3 mg Oral Daily   buPROPion  150 mg Oral Daily   busPIRone  10 mg Oral Daily   DULoxetine  30 mg Oral Daily   ipratropium-albuterol  3 mL Nebulization TID   nicotine  21 mg Transdermal Daily   pantoprazole  40 mg Oral BID AC   [START ON 11/05/2021] rosuvastatin  5 mg Oral Once per day on Mon Wed Fri   sertraline  200 mg Oral QHS   sodium chloride flush  3 mL Intravenous Q12H   Continuous Infusions:  sodium chloride     sodium chloride 1 mL/kg/hr (11/04/21 0506)   heparin 700 Units/hr (11/04/21 0630)   PRN Meds: sodium chloride, acetaminophen, albuterol, alum & mag hydroxide-simeth, guaiFENesin, melatonin, nitroGLYCERIN, ondansetron (ZOFRAN) IV, oxyCODONE, sodium chloride flush   Vital Signs    Vitals:   11/03/21 2043 11/03/21 2305 11/04/21 0032 11/04/21 0352  BP: (!) 153/85  132/82 119/82  Pulse: 100  (!) 104 (!) 103  Resp: 20   20  Temp: 98.4 F (36.9 C)  98.3 F (36.8 C) 98 F (36.7 C)  TempSrc: Oral  Oral Oral  SpO2:  100% 91% 96%  Weight:      Height:        Intake/Output Summary (Last 24 hours) at 11/04/2021 0805 Last data filed at 11/04/2021 0630 Gross per 24 hour  Intake 489.59 ml  Output --  Net 489.59 ml   Last 3 Weights 11/03/2021 11/03/2021 10/21/2021  Weight (lbs) 109 lb 8 oz 110 lb 107 lb 4 oz  Weight (kg) 49.669 kg 49.896 kg 48.648 kg  Some encounter information is confidential and restricted. Go to Review Flowsheets activity to see all data.      Telemetry    SR-->ST  - Personally Reviewed  ECG    SR with TWI in anteroseptal leads - Personally Reviewed  Physical Exam   GEN: No acute distress.   Neck: No JVD Cardiac: RRR, no murmurs,  rubs, or gallops.  Respiratory: Tight, expiratory wheezing  GI: Soft, nontender, non-distended  MS: No edema; No deformity. Neuro:  Nonfocal  Psych: Normal affect   Labs    High Sensitivity Troponin:   Recent Labs  Lab 11/03/21 1040 11/03/21 1414 11/03/21 2215 11/04/21 0129  TROPONINIHS 111* 269* 176* 146*     Chemistry Recent Labs  Lab 11/03/21 1040 11/04/21 0129  NA 139 137  K 3.6 3.7  CL 103 99  CO2 28 28  GLUCOSE 96 175*  BUN 5* 8  CREATININE 0.80 0.88  CALCIUM 8.7* 8.7*  GFRNONAA >60 >60  ANIONGAP 8 10    Lipids  Recent Labs  Lab 11/04/21 0129  CHOL 198  TRIG 44  HDL 105  LDLCALC 84  CHOLHDL 1.9    Hematology Recent Labs  Lab 11/03/21 1040 11/04/21 0129  WBC 8.0 4.6  RBC 3.97 3.68*  HGB 12.5 11.7*  HCT 39.3 35.9*  MCV 99.0 97.6  MCH 31.5 31.8  MCHC 31.8 32.6  RDW 12.6 12.3  PLT 271 224   Thyroid No results for input(s): TSH, FREET4 in the last 168 hours.  BNP Recent Labs  Lab 11/03/21 1040  BNP 60.8    DDimer No results for input(s): DDIMER in the last 168 hours.   Radiology    DG Chest 2 View  Result Date: 11/03/2021 CLINICAL DATA:  70 year old female with chest pain, cough and shortness of breath onset this morning. EXAM: CHEST - 2 VIEW COMPARISON:  Chest CTA 06/22/2021. Radiographs 10/16/2021 and earlier. FINDINGS: Chronic scoliosis and partially visible lumbar fusion hardware. Chronic midthoracic compression fracture and osteopenia. No acute osseous abnormality identified. Stable large lung volumes. Mediastinal contours remain normal. Visualized tracheal air column is within normal limits. Stable mildly increased bilateral lung markings. Both lungs otherwise appear clear. No pneumothorax or pleural effusion. Calcified aortic atherosclerosis.  Stable cholecystectomy clips. IMPRESSION: Chronic emphysema.  No acute cardiopulmonary abnormality. Electronically Signed   By: Genevie Ann M.D.   On: 11/03/2021 11:07    Cardiac Studies    N/a  Patient Profile     70 y.o. female with a hx of mild to moderate CAD, COPD with ongoing tobacco smoking, hypertension, hyperlipidemia and lymphocytic colitis with diarrhea who was seen 11/03/2021 for the evaluation of chest pain at the request of Dr. Eulis Foster.  Assessment & Plan    NSTEMI with hx of nonobstructive CAD'20: hsTn peaked at 269, planned for cardiac cath this morning. EKG showed anteroseptal T wave inversion on admission.  -- on ASA, statin, IV heparin -- will avoid BB use for now with wheezing on exam, underlying COPD/emphysema   HTN: blood pressures are stable.   HLD: LDL 84 -- on crestor 5mg  daily, no issues with myalgias  -- will further increase crestor to 20mg  daily   Recent Bronchitis with COPD: treated with recent antibiotic and steroid taper. Still with non-productive cough, wheezing on exam -- continue albuterol, robitussin  -- seen by pulmonary 7/22, pending PFTs. Suspect she would benefit from maintenance dose inhaler   Tobacco use: continues to smoke  For questions or updates, please contact [place] Please consult [email or web address] for contact info under   Signed, Reino Bellis, NP  11/04/2021, 8:05 AM    Patient seen, examined. Available data reviewed. Agree with findings, assessment, and plan as outlined by Reino Bellis, NP.  On exam, the patient is alert, oriented, no distress.  HEENT is normal, JVP is normal, lungs are clear, heart is regular rate and rhythm with no murmur gallop, abdomen soft nontender, extremities have no edema.  The patient has had no further chest discomfort on IV heparin overnight.  We plan to proceed with cardiac catheterization and possible PCI today as previously outlined.  Risks, indications, and alternatives to cardiac catheterization/PTCA/stenting have been reviewed with the patient and she agrees to proceed.  Otherwise as outlined above.  Sherren Mocha, M.D. 11/04/2021 9:42 AM

## 2021-11-05 ENCOUNTER — Other Ambulatory Visit (HOSPITAL_COMMUNITY): Payer: Self-pay

## 2021-11-05 DIAGNOSIS — F17210 Nicotine dependence, cigarettes, uncomplicated: Secondary | ICD-10-CM | POA: Diagnosis not present

## 2021-11-05 DIAGNOSIS — Z79899 Other long term (current) drug therapy: Secondary | ICD-10-CM | POA: Diagnosis not present

## 2021-11-05 DIAGNOSIS — Z9981 Dependence on supplemental oxygen: Secondary | ICD-10-CM | POA: Diagnosis not present

## 2021-11-05 DIAGNOSIS — I5089 Other heart failure: Secondary | ICD-10-CM | POA: Diagnosis not present

## 2021-11-05 DIAGNOSIS — Z20822 Contact with and (suspected) exposure to covid-19: Secondary | ICD-10-CM | POA: Diagnosis not present

## 2021-11-05 DIAGNOSIS — E785 Hyperlipidemia, unspecified: Secondary | ICD-10-CM | POA: Diagnosis not present

## 2021-11-05 DIAGNOSIS — J449 Chronic obstructive pulmonary disease, unspecified: Secondary | ICD-10-CM | POA: Diagnosis not present

## 2021-11-05 DIAGNOSIS — R69 Illness, unspecified: Secondary | ICD-10-CM | POA: Diagnosis not present

## 2021-11-05 DIAGNOSIS — I214 Non-ST elevation (NSTEMI) myocardial infarction: Secondary | ICD-10-CM | POA: Diagnosis not present

## 2021-11-05 DIAGNOSIS — Z720 Tobacco use: Secondary | ICD-10-CM | POA: Diagnosis not present

## 2021-11-05 DIAGNOSIS — I110 Hypertensive heart disease with heart failure: Secondary | ICD-10-CM | POA: Diagnosis not present

## 2021-11-05 LAB — CBC
HCT: 34.5 % — ABNORMAL LOW (ref 36.0–46.0)
Hemoglobin: 11.3 g/dL — ABNORMAL LOW (ref 12.0–15.0)
MCH: 32.4 pg (ref 26.0–34.0)
MCHC: 32.8 g/dL (ref 30.0–36.0)
MCV: 98.9 fL (ref 80.0–100.0)
Platelets: 227 10*3/uL (ref 150–400)
RBC: 3.49 MIL/uL — ABNORMAL LOW (ref 3.87–5.11)
RDW: 12.7 % (ref 11.5–15.5)
WBC: 9.9 10*3/uL (ref 4.0–10.5)
nRBC: 0 % (ref 0.0–0.2)

## 2021-11-05 MED ORDER — CLOPIDOGREL BISULFATE 75 MG PO TABS: 75.0000 mg | ORAL_TABLET | Freq: Every day | ORAL | Status: DC

## 2021-11-05 MED ORDER — CLOPIDOGREL BISULFATE 75 MG PO TABS
300.0000 mg | ORAL_TABLET | Freq: Once | ORAL | Status: AC
  Administered 2021-11-05: 300 mg via ORAL
  Filled 2021-11-05: qty 4

## 2021-11-05 MED ORDER — ASPIRIN 81 MG PO TBEC
81.0000 mg | DELAYED_RELEASE_TABLET | Freq: Every day | ORAL | 1 refills | Status: AC
  Filled 2021-11-05: qty 90, 90d supply, fill #0

## 2021-11-05 MED ORDER — ROSUVASTATIN CALCIUM 20 MG PO TABS
20.0000 mg | ORAL_TABLET | Freq: Every day | ORAL | 1 refills | Status: AC
  Filled 2021-11-05: qty 90, 90d supply, fill #0

## 2021-11-05 MED ORDER — CLOPIDOGREL BISULFATE 75 MG PO TABS
75.0000 mg | ORAL_TABLET | Freq: Every day | ORAL | 1 refills | Status: AC
  Filled 2021-11-05: qty 90, 90d supply, fill #0

## 2021-11-05 NOTE — Discharge Summary (Addendum)
Discharge Summary    Patient ID: Denise King MRN: [id number]; DOB: [birth year]  Admit date: 11/03/2021 Discharge date: 11/05/2021  PCP:  Biagio Borg, MD   [facility] Providers Cardiologist:  Jenkins Rouge, MD    Discharge Diagnoses    Principal Problem:   NSTEMI (non-ST elevated myocardial infarction) [facility]) Active Problems:   Cigarette smoker   Hyperlipidemia   COPD (chronic obstructive pulmonary disease) ([place])  Diagnostic Studies/Procedures    Cath: 11/04/21  Prox Cx to Mid Cx lesion is 30% stenosed.   Prox RCA lesion is 50% stenosed.   Prox LAD to Mid LAD lesion is 70% stenosed.   The left ventricular systolic function is normal.   LV end diastolic pressure is normal.   The left ventricular ejection fraction is 55-65% by visual estimate.   Moderate CAD with long 70% stenosis in the proximal to mid LAD with heavy calcification. The RCA has a focal 50% stenosis in the proximal vessel Good LV function Normal LVEDP   Plan: would recommend continued medical therapy. If she has refractory symptoms could consider PCI of the LAD but this would require atherectomy and stenting of a fairly long segment. Will review with rounding team.   Diagnostic Dominance: Right  _____________   History of Present Illness     Denise King is a 70 y.o. female with with a hx of mild to moderate CAD, COPD with ongoing tobacco smoking, hypertension, hyperlipidemia and lymphocytic colitis with diarrhea who is being seen 11/03/2021 for the evaluation of chest pain at the request of Dr. Eulis Foster.   Cardiac CTA done 05/09/19 showed calcium score 1304, 99 th percentile with positive FFR CT in RCA/LAD. Follow up cath 05/11/19 with only 50% mid RCA and 50% prox/mid LAD >>>Medical Rx recommended.   Admitted May 2022 for hypercapnic respiratory failure in setting of narcotic use for chronic back pain and lumbar fracture with underlying emphysema.   Establish care with pulmonary July 2022  for COPD.  Has pending pulmonary function test.   She is also dealing with cough with congestion treated with antibiotic on 10/16/21 however worsen cough.    Patient with history of lymphocytic colitis with diarrhea.  Patient is followed by GI and previously on budesonide.  Last seen by GI October 25.  Had worsening epi gastritis given recent nausea and vomiting.  Hospital Course      NSTEMI with hx of nonobstructive CAD'20: hsTn peaked at 269, planned for cardiac cath this morning. EKG showed anteroseptal T wave inversion on admission. Treated with IV heparin. Underwent cardiac cath noted above with moderate CAD with long 70% stenosis in the p/mLAD with heavy calcification. RCA with focal 50% stenosis in the proximal vessel. Recommendations for medical therapy with consideration of PCI of LAD if refractory angina in the future -- on ASA, statin. Started on plavix with 31m load prior to DC given NSTEMI.  -- unable to add BB therapy with COPD   HTN: reports her blood pressures    HLD: LDL 84 -- on crestor 515mdaily PTA, no issues with myalgias  -- further increased crestor to 2050maily  -- needs FLP/LFTs in 8 weeks   Recent Bronchitis with COPD: treated with recent antibiotic and steroid taper.  -- continue albuterol, robitussin  -- seen by pulmonary 7/22, pending PFTs. Encouraged to follow up with pulmonary as an outpatient.    Tobacco use: continues to smoke   General: Well developed, well nourished, female appearing in no acute distress.  Head: Normocephalic, atraumatic.  Neck: Supple without bruits, JVD. Lungs:  Resp regular and unlabored, CTA. Heart: RRR, S1, S2, no S3, S4, or murmur; no rub. Abdomen: Soft, non-tender, non-distended with normoactive bowel sounds. No hepatomegaly. No rebound/guarding. No obvious abdominal masses. Extremities: No clubbing, cyanosis, edema. Distal pedal pulses are 2+ bilaterally. Right cath site stable with bruising but no hematoma Neuro: Alert  and oriented X 3. Moves all extremities spontaneously. Psych: Normal affect.  Patient was seen by Dr. Burt Knack and deemed stable for discharge home. Follow up in the office has been arranged. Medications sent to the [facility] pharmacy prior to discharge. Educated by PharmD.    Did the patient have an acute coronary syndrome (MI, NSTEMI, STEMI, etc) this admission?:  Yes                               AHA/ACC Clinical Performance & Quality Measures: Aspirin prescribed? - Yes ADP Receptor Inhibitor (Plavix/Clopidogrel, Brilinta/Ticagrelor or Effient/Prasugrel) prescribed (includes medically managed patients)? - Yes Beta Blocker prescribed? - No - underlying COPD High Intensity Statin (Lipitor 40-34m or Crestor 20-447m prescribed? - Yes EF assessed during THIS hospitalization? - Yes For EF <40%, was ACEI/ARB prescribed? - Not Applicable (EF >/= 4039%For EF <40%, Aldosterone Antagonist (Spironolactone or Eplerenone) prescribed? - Not Applicable (EF >/= 4003%Cardiac Rehab Phase II ordered (including medically managed patients)? - Yes   The patient will be scheduled for a TOC follow up appointment in 10-14 days.  A [facility] at the office where the patient should be seen for follow up.  _____________  Discharge Vitals Blood pressure (!) 150/95, pulse 81, temperature 98.8 F (37.1 C), temperature source Oral, resp. rate 20, height _0  (1.549 m), weight 49.7 kg, SpO2 92 %.  Filed Weights   11/03/21 1452 11/03/21 2042  Weight: 49.9 kg 49.7 kg    Labs & Radiologic Studies    CBC Recent Labs    11/03/21 1040 11/04/21 0129 11/05/21 0315  WBC 8.0 4.6 9.9  NEUTROABS 4.7  --   --   HGB 12.5 11.7* 11.3*  HCT 39.3 35.9* 34.5*  MCV 99.0 97.6 98.9  PLT 271 224 22009 Basic Metabolic Panel Recent Labs    11/03/21 1040 11/04/21 0129  NA 139 137  K 3.6 3.7  CL 103 99  CO2 28 28  GLUCOSE 96 175*  BUN 5* 8  CREATININE 0.80 0.88  CALCIUM 8.7*  8.7*   Liver Function Tests No results for input(s): AST, ALT, ALKPHOS, BILITOT, PROT, ALBUMIN in the last 72 hours. No results for input(s): LIPASE, AMYLASE in the last 72 hours. High Sensitivity Troponin:   Recent Labs  Lab 11/03/21 1040 11/03/21 1414 11/03/21 2215 11/04/21 0129  TROPONINIHS 111* 269* 176* 146*    BNP Invalid input(s): POCBNP D-Dimer No results for input(s): DDIMER in the last 72 hours. Hemoglobin A1C No results for input(s): HGBA1C in the last 72 hours. Fasting Lipid Panel Recent Labs    11/04/21 0129  CHOL 198  HDL 105  LDLCALC 84  TRIG 44  CHOLHDL 1.9   Thyroid Function Tests No results for input(s): TSH, T4TOTAL, T3FREE, THYROIDAB in the last 72 hours.  Invalid input(s): FREET3 _____________  DG Chest 2 View  Result Date: 11/03/2021 CLINICAL DATA:  [age]ear old female with chest pain, cough and shortness of breath onset this morning. EXAM: CHEST - 2  VIEW COMPARISON:  Chest CTA 06/22/2021. Radiographs 10/16/2021 and earlier. FINDINGS: Chronic scoliosis and partially visible lumbar fusion hardware. Chronic midthoracic compression fracture and osteopenia. No acute osseous abnormality identified. Stable large lung volumes. Mediastinal contours remain normal. Visualized tracheal air column is within normal limits. Stable mildly increased bilateral lung markings. Both lungs otherwise appear clear. No pneumothorax or pleural effusion. Calcified aortic atherosclerosis.  Stable cholecystectomy clips. IMPRESSION: Chronic emphysema.  No acute cardiopulmonary abnormality. Electronically Signed   By: Genevie Ann M.D.   On: 11/03/2021 11:07   DG Chest 2 View  Result Date: 10/17/2021 CLINICAL DATA:  70 year old female with cough EXAM: CHEST - 2 VIEW COMPARISON:  06/26/2021 FINDINGS: Cardiomediastinal silhouette unchanged in size and contour. No evidence of central vascular congestion. No interlobular septal thickening. Stigmata of emphysema, with increased retrosternal  airspace, flattened hemidiaphragms, increased AP diameter, and hyperinflation on the AP view. Similar appearance of coarsened interstitial markings throughout. Similar appearance of bronchiectasis and mild architectural distortion. No pneumothorax or pleural effusion. No new confluent airspace disease No acute displaced fracture. Degenerative changes of the spine. Compression fracture in the midthoracic spine unchanged. IMPRESSION: Chronic lung changes and emphysema without definite evidence of acute cardiopulmonary disease Electronically Signed   By: Corrie Mckusick D.O.   On: 10/17/2021 13:44   CARDIAC CATHETERIZATION  Result Date: 11/04/2021   Prox Cx to Mid Cx lesion is 30% stenosed.   Prox RCA lesion is 50% stenosed.   Prox LAD to Mid LAD lesion is 70% stenosed.   The left ventricular systolic function is normal.   LV end diastolic pressure is normal.   The left ventricular ejection fraction is 55-65% by visual estimate. Moderate CAD with long 70% stenosis in the proximal to mid LAD with heavy calcification. The RCA has a focal 50% stenosis in the proximal vessel Good LV function Normal LVEDP Plan: would recommend continued medical therapy. If she has refractory symptoms could consider PCI of the LAD but this would require atherectomy and stenting of a fairly long segment. Will review with rounding team.   Disposition   Pt is being discharged home today in good condition.  Follow-up Plans & Appointments     Follow-up Information     Imogene Burn, PA-C Follow up on 12/02/2021.   Specialty: Cardiology Why: at 12:15pm for your follow up appt with Dr. Kyla Balzarine PA Nigel Berthold information: [address] [place] [id number] [phone]                  Discharge Medications   Allergies as of 11/05/2021       Reactions   Atorvastatin Nausea And Vomiting   Levofloxacin Other (See Comments)   Achilles tendon pain   Omnicef [cefdinir] Diarrhea, Other (See  Comments)   Uncontrollable diarrhea   Trazodone And Nefazodone Other (See Comments)   "Felt like I was going to faint"   Sulfa Antibiotics Rash   Sulfonamide Derivatives Rash        Medication List     STOP taking these medications    potassium chloride 10 MEQ tablet Commonly known as: KLOR-CON       TAKE these medications    albuterol 108 (90 Base) MCG/ACT inhaler Commonly known as: VENTOLIN HFA Inhale 1-2 puffs into the lungs every 6 (six) hours as needed for wheezing or shortness of breath.   AMBULATORY NON FORMULARY MEDICATION Take 10 mg by mouth daily. Budesonide DR 5 mg capsules   aspirin 81 MG  EC tablet Take 1 tablet (81 mg total) by mouth daily. Swallow whole. Start taking on: November 06, 2021   budesonide 3 MG 24 hr capsule Commonly known as: ENTOCORT EC TAKE 3 CAPSULES ONCE DAILY.   buPROPion 150 MG 12 hr tablet Commonly known as: WELLBUTRIN SR Take 1 tablet (150 mg total) by mouth daily.   busPIRone 10 MG tablet Commonly known as: BUSPAR Take 10 mg by mouth daily.   clopidogrel 75 MG tablet Commonly known as: PLAVIX Take 1 tablet (75 mg total) by mouth daily. Start taking on: November 06, 2021   cyclobenzaprine 5 MG tablet Commonly known as: FLEXERIL Take 1 tablet (5 mg total) by mouth 3 (three) times daily as needed for muscle spasms.   denosumab 60 MG/ML Sosy injection Commonly known as: PROLIA Inject 60 mg into the skin every 6 (six) months.   diclofenac Sodium 1 % Gel Commonly known as: VOLTAREN Apply 2 g topically 4 (four) times daily as needed (for joint pain).   DULoxetine 30 MG capsule Commonly known as: CYMBALTA Take 30 mg by mouth daily.   hyoscyamine 0.125 MG Tbdp disintergrating tablet Commonly known as: ANASPAZ Place 1 tablet (0.125 mg total) under the tongue every 6 (six) hours as needed ([name] cramps).   Melatonin 10 MG Tabs Take 10 mg by mouth at bedtime as needed (sleep).   Mucinex Chest Congestion Child 100 MG/5ML  liquid Generic drug: guaiFENesin Take 5 mLs by mouth every 4 (four) hours as needed for cough or to loosen phlegm.   naloxone 4 MG/0.1ML Liqd nasal spray kit Commonly known as: NARCAN Place 1 spray into the nose as needed (overdose).   nicotine 21 mg/24hr patch Commonly known as: NICODERM CQ - dosed in mg/24 hours Place 1 patch (21 mg total) onto the skin daily.   nitroGLYCERIN 0.4 MG SL tablet Commonly known as: NITROSTAT Place 1 tablet (0.4 mg total) under the tongue every 5 (five) minutes as needed for chest pain.   Oxycodone HCl 10 MG Tabs Take 1 tablet (10 mg total) by mouth 2 (two) times daily as needed.   pantoprazole 40 MG tablet Commonly known as: PROTONIX TAKE (1) TABLET TWICE A DAY BEFORE MEALS. What changed:  how much to take how to take this when to take this additional instructions   rosuvastatin 20 MG tablet Commonly known as: CRESTOR Take 1 tablet (20 mg total) by mouth daily. Start taking on: November 06, 2021 What changed:  medication strength how much to take when to take this additional instructions   sertraline 100 MG tablet Commonly known as: ZOLOFT Take 2 tablets (200 mg total) by mouth at bedtime.   valACYclovir 500 MG tablet Commonly known as: VALTREX Take 500 mg by mouth 2 (two) times daily as needed (flare ups).         Outstanding Labs/Studies   FLP/LFTs in 8 weeks   Duration of Discharge Encounter   Greater than 30 minutes including physician time.  Signed, Reino Bellis, NP 11/05/2021, 11:10 AM  Patient seen, examined. Available data reviewed. Agree with findings, assessment, and plan as outlined by Reino Bellis, NP.  The patient is independently interviewed and examined.  She is alert and oriented in no distress.  Lungs have diffuse rhonchi but otherwise clear with no inspiratory crackles.  Heart is regular rate and rhythm no murmur gallop, abdomen is soft, thin, nontender.  Right wrist site is clear with mild ecchymoses  but no hematoma.  Lower extremities have no edema.  Skin is warm and dry with no rash.  The patient is clinically stable with small non-ST elevation MI peak troponin of 269, preserved LV function by left ventriculography.  The patient is started on clopidogrel.  I had a lengthy discussion with the patient and her daughters yesterday afternoon about medical management of her coronary artery disease.  If she develops recurrent or refractory angina, she could be treated with LAD atherectomy and stenting via a femoral approach.  Considering her comorbidities, we favor a medical approach at this time as she has had no recurrent chest pain.  The patient's medication program is reviewed and will be continued as above.  She is now medically stable for hospital discharge.  Sherren Mocha, M.D. 11/05/2021 11:14 AM

## 2021-11-05 NOTE — Progress Notes (Signed)
Discussed with pt MI, restrictions, risk factors, smoking cessation, diet, exercise, NTG, and CRPII. Pt voiced understanding. She has significant stress but wants to quit smoking. Gave her tips and resources. Will place CRPII referral to [place] however pt does not feel she can do program due to caring for her husband and daughter.  [place], ACSM 12:22 PM 11/05/2021

## 2021-11-05 NOTE — Progress Notes (Signed)
Mobility Specialist Progress Note    11/05/21 1247  Mobility  Activity Ambulated in hall  Level of Assistance Independent  Assistive Device None  Distance Ambulated (ft) 470 ft  Mobility Ambulated independently in hallway  Mobility Response Tolerated well  Mobility performed by Mobility specialist  $Mobility charge 1 Mobility   Pt received in bed and agreeable. No complaints on walk. Returned to bed with call bell in reach.   Hildred Alamin Mobility Specialist  Mobility Specialist Phone: [phone]

## 2021-11-05 NOTE — Consult Note (Signed)
   [name] Inpatient Consult   11/05/2021  [name] 04/07/1951 [id number]  [place] [ACO] Patient: Denise King   Primary Care Provider:  [facility], Biagio Borg, MD, this provider office does the Transition of Care follow up.   Patient was in observation status   Patient screened for hospitalization and has transitioned home . Patient was assess for potential Embedded Chronic Care Management service needs for post hospital transition.  Patient active with Embedded CCM team.  Natividad Brood, RN BSN [facility] Liaison  [phone] business mobile phone Toll free office [phone]  Fax number: [phone] [email or web address]

## 2021-11-06 ENCOUNTER — Other Ambulatory Visit: Payer: Self-pay

## 2021-11-06 ENCOUNTER — Ambulatory Visit (INDEPENDENT_AMBULATORY_CARE_PROVIDER_SITE_OTHER): Payer: Medicare HMO

## 2021-11-06 ENCOUNTER — Ambulatory Visit (HOSPITAL_COMMUNITY)
Admission: EM | Admit: 2021-11-06 | Discharge: 2021-11-06 | Disposition: A | Discharge status: Home / Self Care | Payer: Medicare HMO | Attending: Internal Medicine | Admitting: Internal Medicine

## 2021-11-06 ENCOUNTER — Encounter (HOSPITAL_COMMUNITY): Payer: Self-pay | Admitting: Emergency Medicine

## 2021-11-06 DIAGNOSIS — S92515D Nondisplaced fracture of proximal phalanx of left lesser toe(s), subsequent encounter for fracture with routine healing: Secondary | ICD-10-CM

## 2021-11-06 DIAGNOSIS — M7989 Other specified soft tissue disorders: Secondary | ICD-10-CM | POA: Diagnosis not present

## 2021-11-06 DIAGNOSIS — S99921A Unspecified injury of right foot, initial encounter: Secondary | ICD-10-CM | POA: Diagnosis not present

## 2021-11-06 DIAGNOSIS — M79671 Pain in right foot: Secondary | ICD-10-CM

## 2021-11-06 DIAGNOSIS — T148XXA Other injury of unspecified body region, initial encounter: Secondary | ICD-10-CM

## 2021-11-06 MED ORDER — TETANUS-DIPHTH-ACELL PERTUSSIS 5-2.5-18.5 LF-MCG/0.5 IM SUSY
0.5000 mL | PREFILLED_SYRINGE | Freq: Once | INTRAMUSCULAR | Status: AC
  Administered 2021-11-06: 0.5 mL via INTRAMUSCULAR

## 2021-11-06 MED ORDER — MUPIROCIN 2 % EX OINT: 1.0000 "application " | TOPICAL_OINTMENT | Freq: Every day | CUTANEOUS | 0 refills | Status: AC

## 2021-11-06 MED ORDER — TETANUS-DIPHTH-ACELL PERTUSSIS 5-2.5-18.5 LF-MCG/0.5 IM SUSY
PREFILLED_SYRINGE | INTRAMUSCULAR | Status: AC
  Filled 2021-11-06: qty 0.5

## 2021-11-06 NOTE — Discharge Instructions (Signed)
There was no fracture on x-ray that is new or concerning; they did see the old fracture but this is unchanged.  Please keep area clean with soap and water and apply Bactroban ointment with dressing changes.  We updated your tetanus today.  Use your prescription pain medication as needed.  If your symptoms are not improving or if anything worsens follow-up with podiatry.

## 2021-11-06 NOTE — ED Provider Notes (Signed)
[place]    CSN: [id number] Arrival date & time: 11/06/21  1241      History   Chief Complaint Chief Complaint  Patient presents with   Foot Injury    HPI LEMOYNE SCARPATI is a 70 y.o. female.   Patient presents today with a several hour history of right foot pain.  Reports that yesterday her 80 pound dog stepped on her right foot and has had pain and bleeding since that time.  She was able to stop bleeding with direct pressure but this did bleed for a while she recently was discharged from the hospital after having heart catheterization during which time she was anticoagulated.  She is prescribed oxycodone and has taken this medication without improvement of symptoms.  She is having difficulty bearing weight as result of pain.  Pain is rated 9 on a 0-10 pain scale, localized to right foot with radiation into toes, described as throbbing/aching, no aggravating alleviating factors identified.  Denies any numbness or paresthesias.  Last tetanus was 12/22/2013.   Past Medical History:  Diagnosis Date   Acute cystitis    Acute respiratory failure ([place])    Allergy    as a child grew out of them   Anemia    pernicious anemia   Anxiety    Arthritis    Back pain    Blood transfusion    CAP (community acquired pneumonia)    CHF (congestive heart failure) (HCC)    Chronic pain syndrome    Common bile duct stone    Depression    Diverticulosis of colon    Duodenal ulcer hemorrhagic-after biliary sphincterotomy    Dysthymia    Esophagitis    EtOH dependence ([place])    Gastritis    GERD (gastroesophageal reflux disease)    H/O chest pain Dec. 2013   no work up done   Hx of colonic polyps    Hypertension    Irritable bowel syndrome    Lymphocytic colitis 02/16/2020   Osteopenia    Osteoporosis    Pneumonia 2019   Polypharmacy    Reflux esophagitis    Right sided sciatica 12/22/2017   Tobacco use disorder    Unspecified chronic bronchitis (HCC)    Vertigo     Vitamin B12 deficiency    Wears glasses     Patient Active Problem List   Diagnosis Date Noted   NSTEMI (non-ST elevated myocardial infarction) ([place]) 11/03/2021   COPD (chronic obstructive pulmonary disease) ([place]) 06/30/2021   Vitamin D deficiency 06/30/2021   Chronic hypoxemic respiratory failure ([place]) 06/30/2021   Fatigue 06/26/2021   Acute on chronic respiratory failure ([place]) 06/22/2021   T8 vertebral fracture ([place]) 06/22/2021   Compression fracture of thoracic vertebra ([place]) 05/16/2021   Aortic atherosclerosis ([place]) 03/13/2021   Genital herpes simplex 03/12/2021   Hyperlipidemia 03/12/2021   Severe sepsis ([place]) 02/10/2021   Acute cystitis 02/10/2021   Left hip pain 06/22/9484   Acute metabolic encephalopathy 46/27/0350   Toe fracture, right 02/01/2021   Concussion 02/01/2021   Floaters, bilateral 01/31/2021   Acute on chronic respiratory failure with hypoxia and hypercapnia ([place]) 07/10/2020   Acute on chronic heart failure ([place]) 07/04/2020   Choledocholithiasis 02/22/2020   Lymphocytic colitis 02/16/2020   Duodenal ulcer hemorrhagic    Malnutrition of moderate degree 01/01/2020   Acute respiratory failure with hypoxia and hypercapnia ([place]) 12/28/2019   Alcohol abuse 12/28/2019   Acute on chronic heart failure with preserved ejection fraction ([place]) 12/28/2019  Abnormal MRI of abdomen    Cholelithiasis    Common bile duct stone 12/23/2019   Acute cholecystitis 12/23/2019   Polycythemia 09/11/2019   Right knee pain 08/09/2019   Quadriceps contusion 08/09/2019   Abnormal cardiac CT angiography 05/11/2019   Chest pain 04/11/2019   Acute viral syndrome 04/11/2019   Hyperphosphatemia 04/11/2019   Osteoporosis 01/03/2019   Closed fracture of neck of femur ([place]) 10/17/2018   Hip fracture ([place]) 10/01/2018   Dysuria 05/20/2018   Right leg swelling 05/20/2018   CAP (community acquired pneumonia) 05/13/2018   Tachycardia 05/13/2018   Chest wall pain 05/13/2018    Narcotic poisoning ([place]) 05/13/2018   EtOH dependence ([place])    Polypharmacy    Esophagitis    Encounter for well adult exam with abnormal findings 12/22/2017   Erythrocytosis 12/22/2017   Depression 12/22/2017   Hyperglycemia 12/22/2017   Right sided sciatica 12/22/2017   Cough 06/10/2017   Family history of colon cancer - brother and father 03/10/2016   Insomnia 02/11/2016   Generalized anxiety disorder 11/25/2015   Urinary frequency 04/25/2015   Nausea and vomiting in adult 11/08/2014   Spinal stenosis of lumbar region without neurogenic claudication 02/14/2014   Cigarette smoker 08/15/2013   Otitis media 10/27/2012   Abdominal pain 03/30/2012   HEPATIC CYST 02/18/2010   Chronic pain 02/08/2010   Vitamin B12 deficiency 10/04/2009   GERD 01/04/2009   ABDOMINAL PAIN-EPIGASTRIC 01/04/2009   Venous (peripheral) insufficiency 08/10/2008   Irritable bowel syndrome 08/10/2008   Back pain 08/10/2008   Hx of adenomatous polyp of colon 12/06/2007   BRONCHITIS, RECURRENT 12/06/2007   DIVERTICULOSIS OF COLON 12/06/2007   Other forms of scoliosis, lumbosacral region 12/06/2007   CALCULUS, KIDNEY 10/07/2007   VERTIGO 10/07/2007   Headache(784.0) 10/07/2007    Past Surgical History:  Procedure Laterality Date   APPENDECTOMY     BACK SURGERY  10-09   Dr. Patrice Paradise   BIOPSY  02/14/2020   Procedure: BIOPSY;  Surgeon: Gatha Mayer, MD;  Location: [facility];  Service: Endoscopy;;   CARPAL TUNNEL RELEASE Right 08/14/2014   Procedure: RIGHT CARPAL TUNNEL RELEASE AND INJECT LEFT THUMB;  Surgeon: Daryll Brod, MD;  Location: [facility];  Service: Orthopedics;  Laterality: Right;   CHOLECYSTECTOMY N/A 02/22/2020   Procedure: LAPAROSCOPIC CHOLECYSTECTOMY WITH INTRAOPERATIVE CHOLANGIOGRAM;  Surgeon: Kieth Brightly Arta Bruce, MD;  Location: [facility];  Service: General;  Laterality: N/A;   COLONOSCOPY     COLONOSCOPY WITH PROPOFOL N/A 02/14/2020   Procedure: COLONOSCOPY WITH PROPOFOL;   Surgeon: Gatha Mayer, MD;  Location: [facility];  Service: Endoscopy;  Laterality: N/A;   ENDOSCOPIC RETROGRADE CHOLANGIOPANCREATOGRAPHY (ERCP) WITH PROPOFOL N/A 12/25/2019   Procedure: ENDOSCOPIC RETROGRADE CHOLANGIOPANCREATOGRAPHY (ERCP) WITH PROPOFOL;  Surgeon: Gatha Mayer, MD;  Location: [facility];  Service: Gastroenterology;  Laterality: N/A;   ESOPHAGOGASTRODUODENOSCOPY (EGD) WITH PROPOFOL N/A 01/06/2020   Procedure: ESOPHAGOGASTRODUODENOSCOPY (EGD) WITH PROPOFOL;  Surgeon: Irene Shipper, MD;  Location: [facility]COPY;  Service: Endoscopy;  Laterality: N/A;   HOT HEMOSTASIS N/A 01/06/2020   Procedure: HOT HEMOSTASIS (ARGON PLASMA COAGULATION/BICAP);  Surgeon: Irene Shipper, MD;  Location: [facility] ENDOSCOPY;  Service: Endoscopy;  Laterality: N/A;   INTRAMEDULLARY (IM) NAIL INTERTROCHANTERIC Right 10/01/2018   Procedure: INTRAMEDULLARY (IM) NAIL INTERTROCHANTRIC;  Surgeon: Thornton Park, MD;  Location: [facility]S;  Service: Orthopedics;  Laterality: Right;   LAPAROSCOPY N/A 02/21/2013   Procedure: LAPAROSCOPY OPERATIVE;  Surgeon: Margarette Asal, MD;  Location: [facility];  Service: Gynecology;  Laterality: N/A;  REQUESTING 5MM SCOPE WITH CAMERA   LEFT HEART CATH AND CORONARY ANGIOGRAPHY N/A 05/11/2019   Procedure: LEFT HEART CATH AND CORONARY ANGIOGRAPHY;  Surgeon: Sherren Mocha, MD;  Location: [facility];  Service: Cardiovascular;  Laterality: N/A;   LEFT HEART CATH AND CORONARY ANGIOGRAPHY N/A 11/04/2021   Procedure: LEFT HEART CATH AND CORONARY ANGIOGRAPHY;  Surgeon: Martinique, Peter M, MD;  Location: [facility] [facility];  Service: Cardiovascular;  Laterality: N/A;   REMOVAL OF STONES  12/25/2019   Procedure: Karlyn Agee;  Surgeon: Gatha Mayer, MD;  Location: [facility] ENDOSCOPY;  Service: Gastroenterology;;   Clide Deutscher  01/06/2020   Procedure: Clide Deutscher;  Surgeon: Irene Shipper, MD;  Location: [facility] ENDOSCOPY;  Service: Endoscopy;;   SPHINCTEROTOMY  12/25/2019   Procedure:  Joan Mayans;  Surgeon: Gatha Mayer, MD;  Location: [facility] ENDOSCOPY;  Service: Gastroenterology;;   TONSILLECTOMY     TUBAL LIGATION     UPPER GASTROINTESTINAL ENDOSCOPY      OB History   No obstetric history on file.      Home Medications    Prior to Admission medications   Medication Sig Start Date End Date Taking? Authorizing Provider  mupirocin ointment (BACTROBAN) 2 % Apply 1 application topically daily. 11/06/21  Yes Brittania Sudbeck K, PA-C  albuterol (VENTOLIN HFA) 108 (90 Base) MCG/ACT inhaler Inhale 1-2 puffs into the lungs every 6 (six) hours as needed for wheezing or shortness of breath.    [provider]  AMBULATORY NON FORMULARY MEDICATION Take 10 mg by mouth daily. Budesonide DR 5 mg capsules 10/23/21   Zehr, Laban Emperor, PA-C  aspirin 81 MG EC tablet Take 1 tablet (81 mg total) by mouth daily. Swallow whole. 11/06/21   Cheryln Manly, NP  budesonide (ENTOCORT EC) 3 MG 24 hr capsule TAKE 3 CAPSULES ONCE DAILY. Patient not taking: Reported on 11/03/2021 05/13/21   Gatha Mayer, MD  buPROPion [facility] SR) 150 MG 12 hr tablet Take 1 tablet (150 mg total) by mouth daily. 04/23/21 11/03/21  Elvin So, MD  busPIRone (BUSPAR) 10 MG tablet Take 10 mg by mouth daily. 10/29/21   [provider]  clopidogrel (PLAVIX) 75 MG tablet Take 1 tablet (75 mg total) by mouth daily. 11/06/21   Cheryln Manly, NP  cyclobenzaprine (FLEXERIL) 5 MG tablet Take 1 tablet (5 mg total) by mouth 3 (three) times daily as needed for muscle spasms. 06/04/21   Biagio Borg, MD  denosumab (PROLIA) 60 MG/ML SOSY injection Inject 60 mg into the skin every 6 (six) months. Patient not taking: Reported on 11/03/2021    [provider]  diclofenac Sodium (VOLTAREN) 1 % GEL Apply 2 g topically 4 (four) times daily as needed (for joint pain). Patient not taking: Reported on 11/03/2021 02/21/21   Elodia Florence., MD  DULoxetine (CYMBALTA) 30 MG capsule Take 30 mg by mouth  daily.    [provider]  guaiFENesin (MUCINEX CHEST CONGESTION CHILD) 100 MG/5ML liquid Take 5 mLs by mouth every 4 (four) hours as needed for cough or to loosen phlegm.    [provider]  hyoscyamine (ANASPAZ) 0.125 MG TBDP disintergrating tablet Place 1 tablet (0.125 mg total) under the tongue every 6 (six) hours as needed ([name] cramps). 04/09/21   Gatha Mayer, MD  Melatonin 10 MG TABS Take 10 mg by mouth at bedtime as needed (sleep).    [provider]  naloxone [facility]) nasal spray 4 mg/0.1 mL  Place 1 spray into the nose as needed (overdose).    [provider]  nicotine (NICODERM CQ - DOSED IN MG/24 HOURS) 21 mg/24hr patch Place 1 patch (21 mg total) onto the skin daily. 01/05/20   Swayze, Ava, DO  nitroGLYCERIN (NITROSTAT) 0.4 MG SL tablet Place 1 tablet (0.4 mg total) under the tongue every 5 (five) minutes as needed for chest pain. 04/22/20   Josue Hector, MD  Oxycodone HCl 10 MG TABS Take 1 tablet (10 mg total) by mouth 2 (two) times daily as needed. 06/24/21   Thurnell Lose, MD  pantoprazole (PROTONIX) 40 MG tablet TAKE (1) TABLET TWICE A DAY BEFORE MEALS. Patient taking differently: Take 40 mg by mouth 2 (two) times daily before a meal. 01/29/21   Gatha Mayer, MD  rosuvastatin (CRESTOR) 20 MG tablet Take 1 tablet (20 mg total) by mouth daily. 11/06/21   Cheryln Manly, NP  sertraline (ZOLOFT) 100 MG tablet Take 2 tablets (200 mg total) by mouth at bedtime. 04/23/21 11/03/21  Elvin So, MD  valACYclovir (VALTREX) 500 MG tablet Take 500 mg by mouth 2 (two) times daily as needed (flare ups).    [provider]    Family History Family History  Problem Relation Age of Onset   Heart disease Mother        prev MVR, also has DJD   Colon cancer Father    Prostate cancer Father    Skin cancer Sister    Colon cancer Brother    Alcohol abuse Daughter    Bipolar disorder Daughter    Drug abuse Daughter    Esophageal cancer Neg  Hx    Rectal cancer Neg Hx    Stomach cancer Neg Hx     Social History Social History   Tobacco Use   Smoking status: Every Day    Packs/day: 1.00    Years: 30.00    Pack years: 30.00    Types: Cigarettes   Smokeless tobacco: Never  Vaping Use   Vaping Use: Never used  Substance Use Topics   Alcohol use: Yes    Alcohol/week: 3.0 standard drinks    Types: 3 Standard drinks or equivalent per week    Comment: occasional   Drug use: No     Allergies   Atorvastatin, Levofloxacin, Omnicef [cefdinir], Trazodone and nefazodone, Sulfa antibiotics, and Sulfonamide derivatives   Review of Systems Review of Systems  Constitutional:  Positive for activity change. Negative for appetite change, fatigue and fever.  Respiratory:  Negative for cough and shortness of breath.   Cardiovascular:  Negative for chest pain.  Gastrointestinal:  Negative for abdominal pain, diarrhea, nausea and vomiting.  Musculoskeletal:  Positive for arthralgias and gait problem. Negative for joint swelling and myalgias.  Skin:  Positive for color change.  Neurological:  Negative for dizziness, weakness, light-headedness, numbness and headaches.    Physical Exam Triage Vital Signs ED Triage Vitals  Enc Vitals Group     BP 11/06/21 1340 (!) 142/79     Pulse --      Resp 11/06/21 1340 20     Temp 11/06/21 1340 98.8 F (37.1 C)     Temp Source 11/06/21 1340 Oral     SpO2 11/06/21 1340 97 %     Weight --      Height --      Head Circumference --      Peak Flow --      Pain Score 11/06/21 1335 9  Pain Loc --      Pain Edu? --      Excl. in [place]? --    No data found.  Updated Vital Signs BP (!) 142/79 (BP Location: Left Arm)   Temp 98.8 F (37.1 C) (Oral)   Resp 20   SpO2 97%   Visual Acuity Right Eye Distance:   Left Eye Distance:   Bilateral Distance:    Right Eye Near:   Left Eye Near:    Bilateral Near:     Physical Exam Vitals reviewed.  Constitutional:      General: She is  awake. She is not in acute distress.    Appearance: Normal appearance. She is well-developed. She is not ill-appearing.     Comments: Very pleasant female appears stated age no acute distress  HENT:     Head: Normocephalic and atraumatic.  Cardiovascular:     Rate and Rhythm: Normal rate and regular rhythm.     Pulses:          Posterior tibial pulses are 2+ on the right side and 2+ on the left side.     Heart sounds: Normal heart sounds, S1 normal and S2 normal. No murmur heard. Pulmonary:     Effort: Pulmonary effort is normal.     Breath sounds: Normal breath sounds. No wheezing, rhonchi or rales.     Comments: Clear auscultation bilaterally Abdominal:     General: Bowel sounds are normal.     Palpations: Abdomen is soft.     Tenderness: There is no abdominal tenderness. There is no right CVA tenderness, left CVA tenderness, guarding or rebound.  Musculoskeletal:     Right foot: Laceration, tenderness and bony tenderness present. No swelling.     Comments: Right foot: Foot neurovascularly intact.  Bruising and swelling noted over dorsal medial right foot.  No deformity noted.  Tenderness palpation on exam.  2 cm x 1 cm skin avulsion noted without active bleeding.  Psychiatric:        Behavior: Behavior is cooperative.     UC Treatments / Results  Labs (all labs ordered are listed, but only abnormal results are displayed) Labs Reviewed - No data to display  EKG   Radiology DG Foot Complete Right  Result Date: 11/06/2021 CLINICAL DATA:  dog stepped on foot, swelling pain bruising EXAM: RIGHT FOOT COMPLETE - 3+ VIEW COMPARISON:  None FINDINGS: There is a there is a healing nondisplaced fracture of the fourth toe proximal phalanx in unchanged alignment in comparison to prior exam in January. There is no evidence of new acute fracture. IMPRESSION: Healing nondisplaced fourth toe proximal phalanx fracture in unchanged alignment. No new acute fracture. Electronically Signed   By:  Maurine Simmering M.D.   On: 11/06/2021 13:55    Procedures Procedures (including critical care time)  Medications Ordered in UC Medications  Tdap (BOOSTRIX) injection 0.5 mL (has no administration in time range)    Initial Impression / Assessment and Plan / UC Course  I have reviewed the triage vital signs and the nursing notes.  Pertinent labs & imaging results that were available during my care of the patient were reviewed by me and considered in my medical decision making (see chart for details).     X-ray obtained given mechanism of injury showed no acute fracture but did show unchanged nonhealing fracture of fourth toe proximal phalanx.  Skin avulsion was cleaned and dressed in clinic.  Patient was prescribed Bactroban to be used on skin  avulsion to prevent infection.  Discussed signs or symptoms of infection that warrant initiation of antibiotics and reevaluation.  She can use prescription pain medication for symptom relief.  She was placed in postop shoe for comfort.  Discussed that if symptoms are improving she should follow-up with podiatry and was given contact information for local group.  Discussed alarm symptoms that warrant emergent evaluation.  Strict return precautions given to which she expressed understanding.  Final Clinical Impressions(s) / UC Diagnoses   Final diagnoses:  Right foot pain  Injury of right foot, initial encounter  Skin avulsion     Discharge Instructions      There was no fracture on x-ray that is new or concerning; they did see the old fracture but this is unchanged.  Please keep area clean with soap and water and apply Bactroban ointment with dressing changes.  We updated your tetanus today.  Use your prescription pain medication as needed.  If your symptoms are not improving or if anything worsens follow-up with podiatry.     ED Prescriptions     Medication Sig Dispense Auth. Provider   mupirocin ointment (BACTROBAN) 2 % Apply 1 application  topically daily. 22 g Brienna Bass K, PA-C      PDMP not reviewed this encounter.   Terrilee Croak, PA-C 11/06/21 1427

## 2021-11-06 NOTE — ED Triage Notes (Signed)
Right foot injury.  Patient reports 80 lb dog stepped on right foot yesterday when trying to get to a cat.  Patient has bruising to top of foot and middle toe bruise and less bruising to toe next to middle toe and little toe.    Patient had a cardiac catheterization on Tuesday of this week.

## 2021-11-09 DIAGNOSIS — I509 Heart failure, unspecified: Secondary | ICD-10-CM | POA: Diagnosis not present

## 2021-11-12 ENCOUNTER — Other Ambulatory Visit (HOSPITAL_COMMUNITY): Payer: Self-pay

## 2021-11-12 IMAGING — RF DG CHOLANGIOGRAM OPERATIVE
1 series · 7 of 7 positions shown · non-contrast
Comparison: ERCP-12/25/2019; MRCP-12/24/2019

CLINICAL DATA: Intraoperative cholangiogram during laparoscopic
cholecystectomy.

EXAM:
INTRAOPERATIVE CHOLANGIOGRAM
FLUOROSCOPY TIME:  26 seconds (5.42 mGy)

[Series 1: run · 2 acquisitions, 7 frames shown]
[im 1/2]
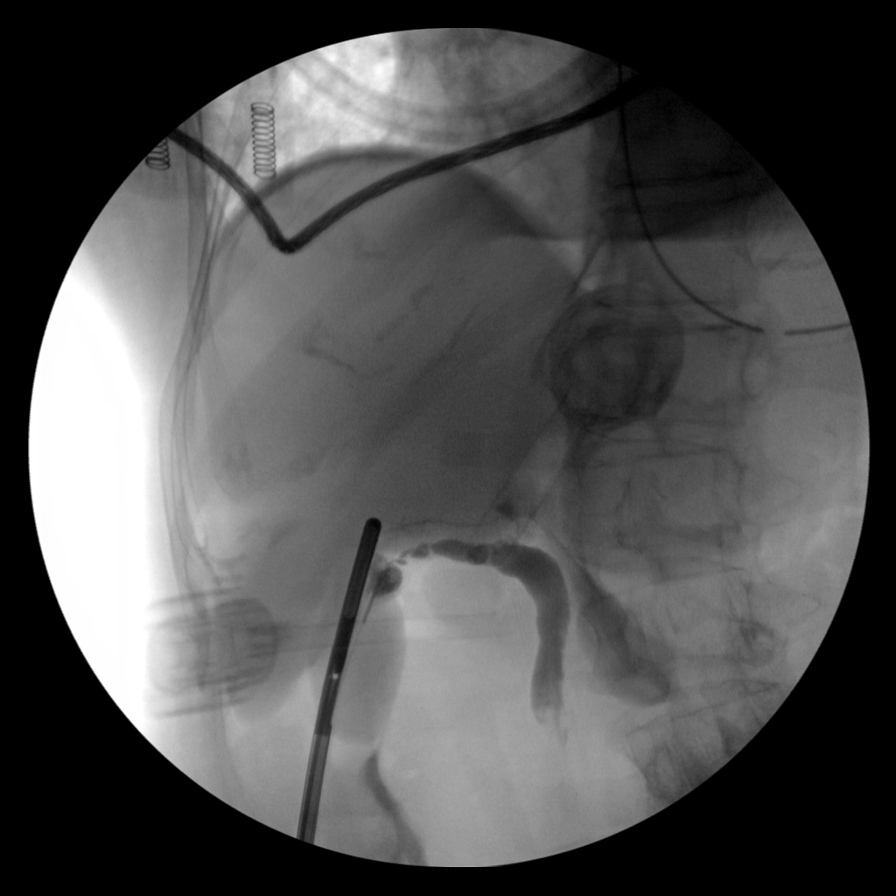
[im 1/2]
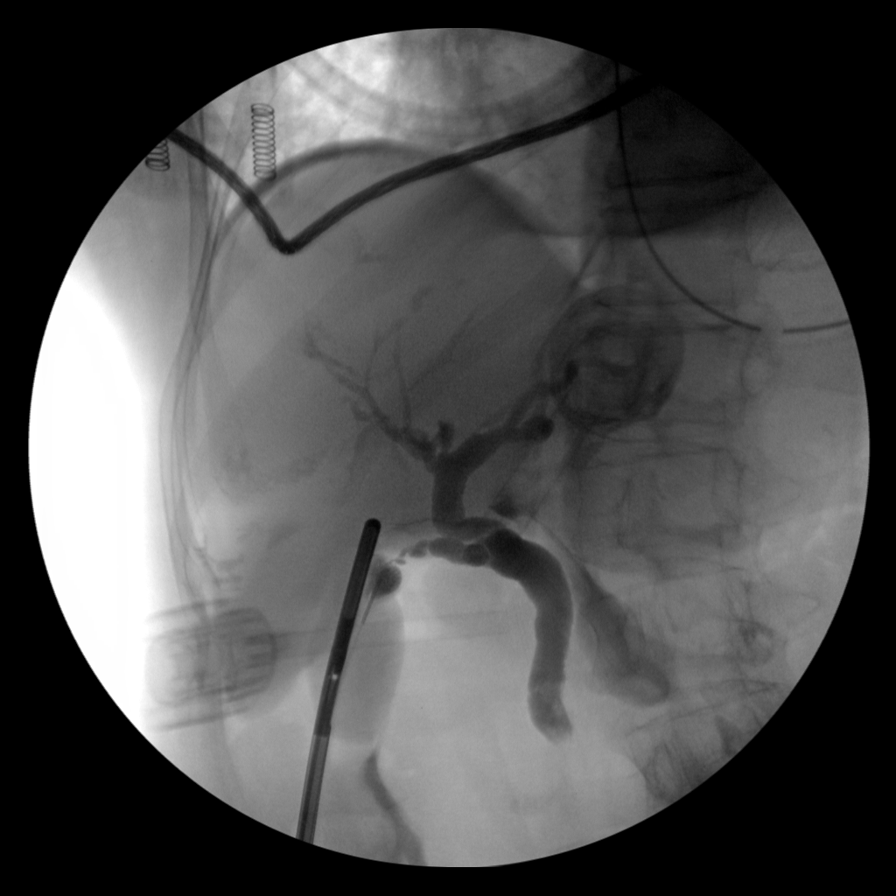
[im 1/2]
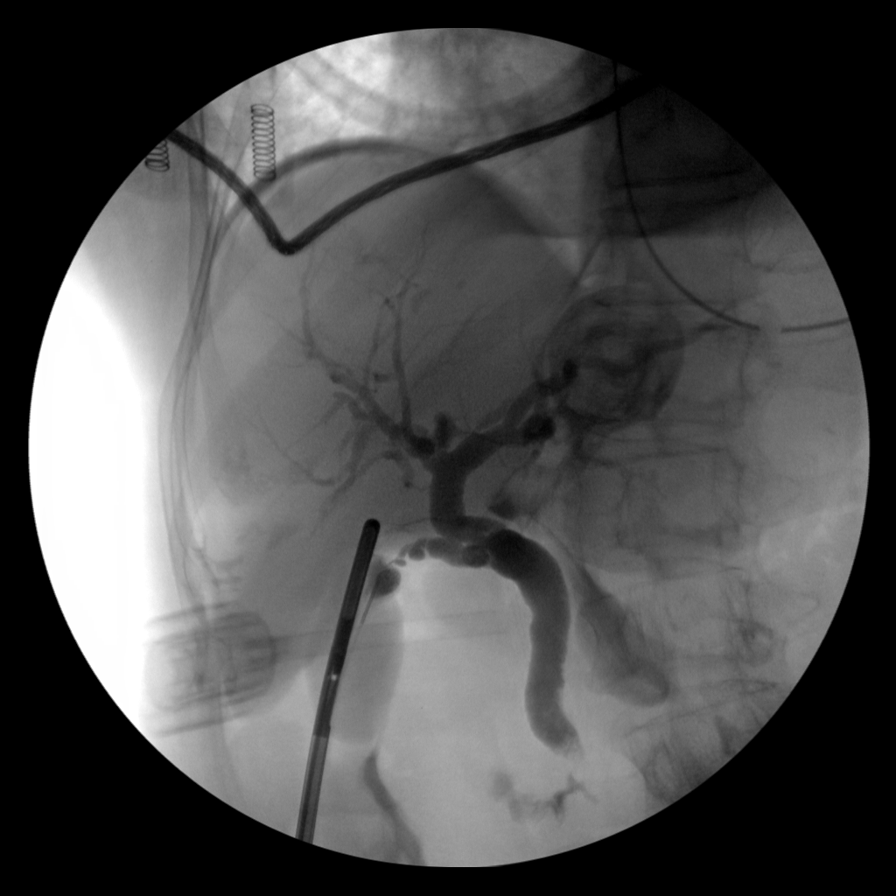
[im 2/2]
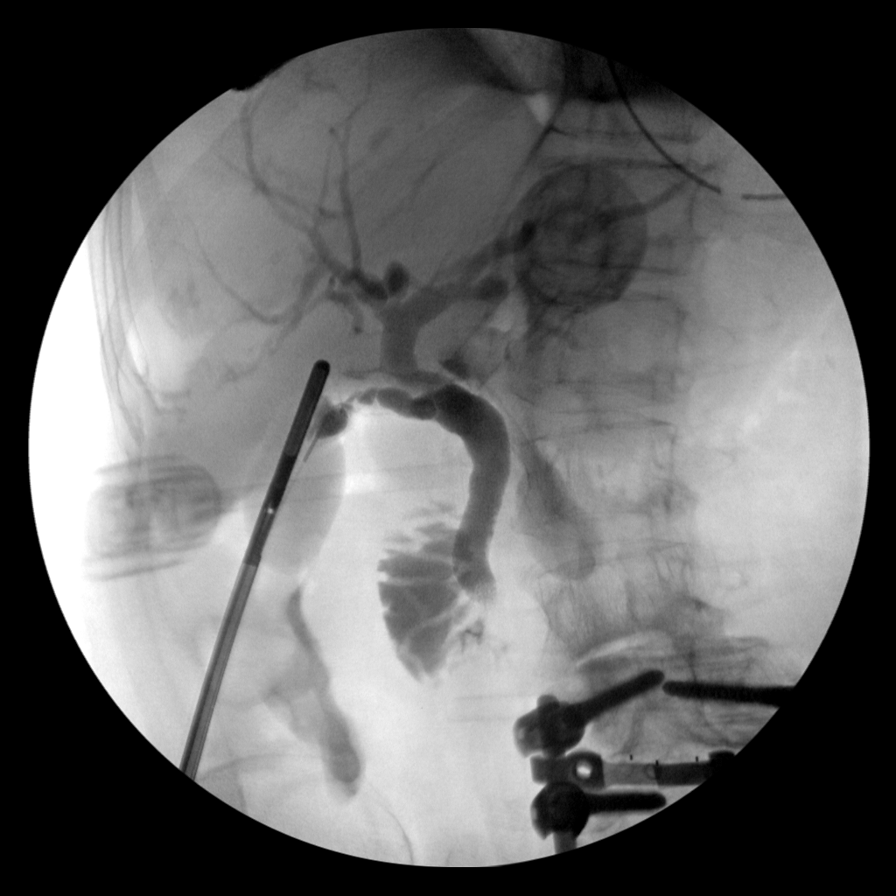
[im 2/2]
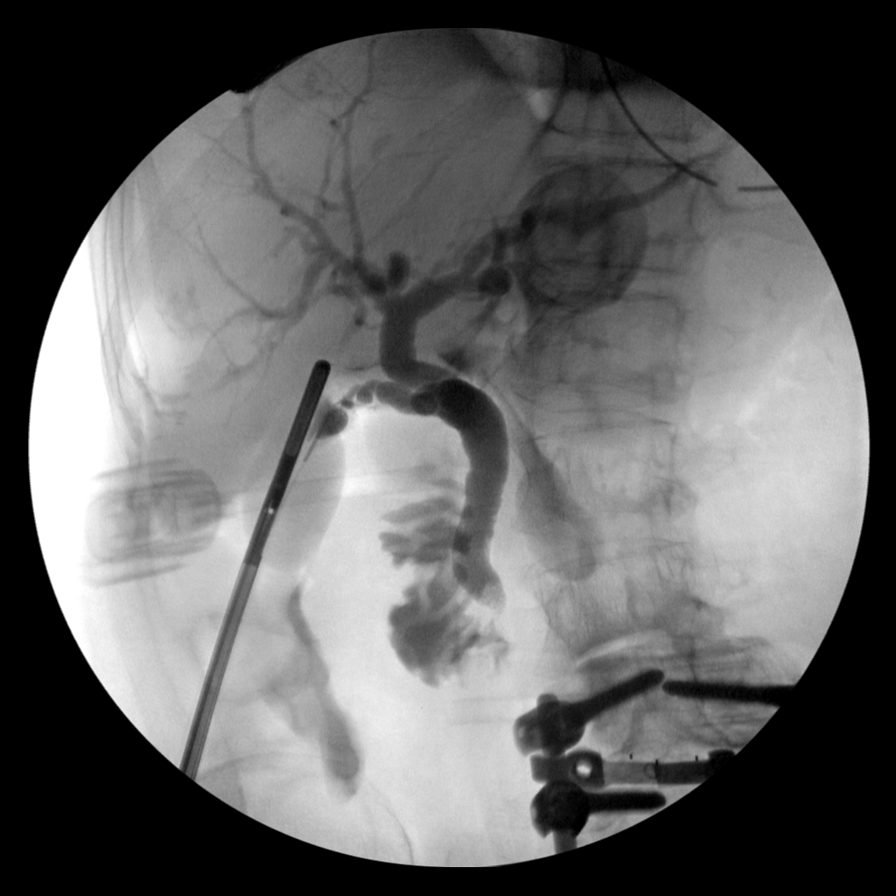
[im 2/2]
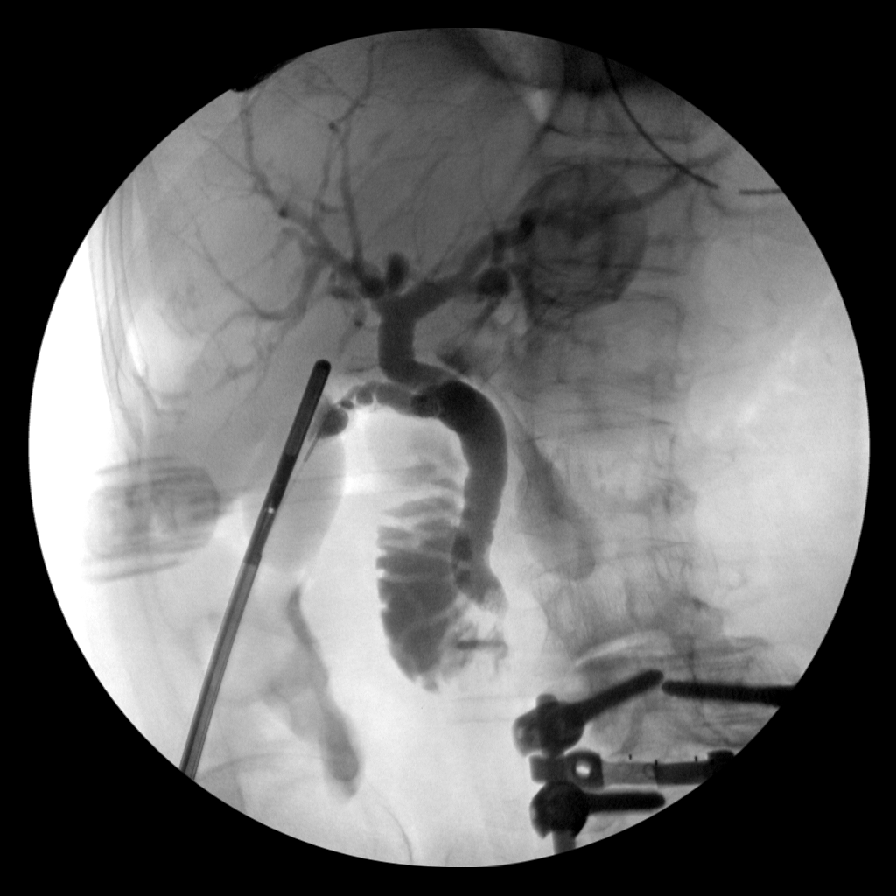
[im 2/2]
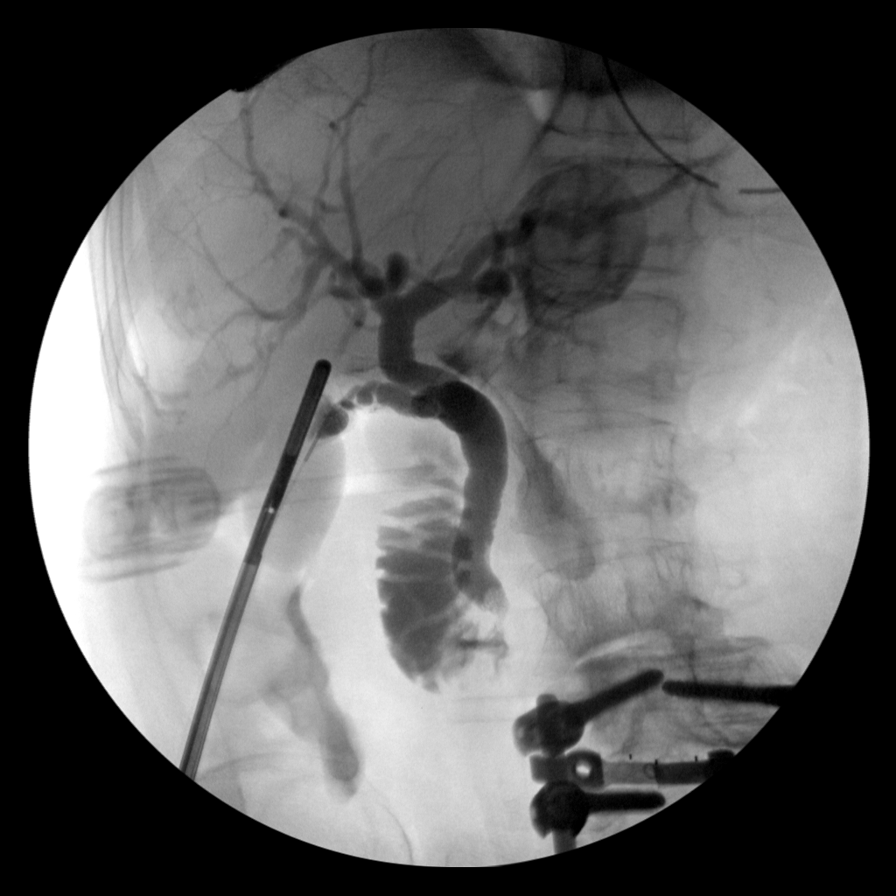

[7 of 7 positions shown; findings below may reference images not displayed]

FINDINGS: Intraoperative cholangiographic images of the right upper abdominal
quadrant during laparoscopic cholecystectomy are provided for
review.

Surgical clips overlie the expected location of the gallbladder
fossa.

Contrast injection demonstrates selective cannulation of the central
aspect of the cystic duct.

There is passage of contrast through the central aspect of the
cystic duct with filling of a non dilated common bile duct. There is
passage of contrast though the CBD and into the descending portion
of the duodenum.

There is minimal reflux of injected contrast into the common hepatic
duct and central aspect of the non dilated intrahepatic biliary
system.

There is a minimal amount of persistent nonocclusive debris within
distal aspect of the CBD.
IMPRESSION: Minimal amount of persistent nonocclusive debris within the distal
aspect of the CBD, worrisome for nonocclusive choledocholithiasis.
Correlation with the operative report is advised.

## 2021-11-14 DIAGNOSIS — L814 Other melanin hyperpigmentation: Secondary | ICD-10-CM | POA: Diagnosis not present

## 2021-11-14 DIAGNOSIS — L539 Erythematous condition, unspecified: Secondary | ICD-10-CM | POA: Diagnosis not present

## 2021-11-14 DIAGNOSIS — L989 Disorder of the skin and subcutaneous tissue, unspecified: Secondary | ICD-10-CM | POA: Diagnosis not present

## 2021-11-17 ENCOUNTER — Other Ambulatory Visit: Payer: Self-pay | Admitting: Cardiovascular Disease

## 2021-11-18 ENCOUNTER — Other Ambulatory Visit (HOSPITAL_COMMUNITY): Payer: Self-pay

## 2021-11-18 ENCOUNTER — Telehealth (HOSPITAL_COMMUNITY): Payer: Self-pay | Admitting: Pharmacist

## 2021-11-18 DIAGNOSIS — G8929 Other chronic pain: Secondary | ICD-10-CM | POA: Diagnosis not present

## 2021-11-18 DIAGNOSIS — Z09 Encounter for follow-up examination after completed treatment for conditions other than malignant neoplasm: Secondary | ICD-10-CM | POA: Diagnosis not present

## 2021-11-18 DIAGNOSIS — Z79899 Other long term (current) drug therapy: Secondary | ICD-10-CM | POA: Diagnosis not present

## 2021-11-18 DIAGNOSIS — K5090 Crohn's disease, unspecified, without complications: Secondary | ICD-10-CM | POA: Diagnosis not present

## 2021-11-18 DIAGNOSIS — R69 Illness, unspecified: Secondary | ICD-10-CM | POA: Diagnosis not present

## 2021-11-18 DIAGNOSIS — R030 Elevated blood-pressure reading, without diagnosis of hypertension: Secondary | ICD-10-CM | POA: Diagnosis not present

## 2021-11-18 DIAGNOSIS — M545 Low back pain, unspecified: Secondary | ICD-10-CM | POA: Diagnosis not present

## 2021-11-18 DIAGNOSIS — F17210 Nicotine dependence, cigarettes, uncomplicated: Secondary | ICD-10-CM | POA: Diagnosis not present

## 2021-11-18 DIAGNOSIS — Z681 Body mass index (BMI) 19 or less, adult: Secondary | ICD-10-CM | POA: Diagnosis not present

## 2021-11-18 DIAGNOSIS — Z23 Encounter for immunization: Secondary | ICD-10-CM | POA: Diagnosis not present

## 2021-11-18 NOTE — Telephone Encounter (Signed)
VM

## 2021-11-21 DIAGNOSIS — Z79899 Other long term (current) drug therapy: Secondary | ICD-10-CM | POA: Diagnosis not present

## 2021-11-24 ENCOUNTER — Telehealth (HOSPITAL_COMMUNITY): Payer: Self-pay

## 2021-11-24 ENCOUNTER — Other Ambulatory Visit (HOSPITAL_COMMUNITY): Payer: Self-pay

## 2021-11-24 NOTE — Telephone Encounter (Signed)
Attempted to contact Denise King for a follow up conversation.

## 2021-11-25 NOTE — Progress Notes (Signed)
Cardiology Office Note    Date:  12/02/2021   ID:  Denise King, DOB [birth year], MRN [id number]   PCP:  Biagio Borg, MD   [place]  Cardiologist:  Jenkins Rouge, MD   Advanced Practice Provider:  No care team member to display Electrophysiologist:  None   [phone]   Chief Complaint  Patient presents with   Hospitalization Follow-up     History of Present Illness:  Denise King is a 70 y.o. female  with COPD with ongoing tobacco smoking, hypertension, hyperlipidemia and lymphocytic colitis with diarrhea  a hx of mild to moderate CADCardiac CTA done 2020 showed calcium score 1304, 99 th percentile with positive FFR CT in RCA/LAD. Follow up cath 05/11/19 with only 50% mid RCA and 50% prox/mid LAD >>>Medical Rx recommended.   Admitted May 2022 for hypercapnic respiratory failure in setting of narcotic use for chronic back pain and lumbar fracture with underlying emphysema.  Patient discharge 11/05/21 with NSTEMI hsTn 269, EKG anteroseptal TWI, cath 70% p/mLAD with heavy calcification, 50% RCA-medical therapy and consider PCI LAD if refractory angina. No BB with COPD.  Patient comes in for f/u. Has pain and swelling in right arm since cath. Denies chest pain. Breathing ok with albuterol. Still smoking 1/2 ppd. Using nicotine patches. Stays busy with housework and driving her husband and daughter around. Daughter is bipolar and a lot of stress. Husband has declining health.   Past Medical History:  Diagnosis Date   Acute cystitis    Acute respiratory failure ([place])    Allergy    as a child grew out of them   Anemia    pernicious anemia   Anxiety    Arthritis    Back pain    Blood transfusion    CAP (community acquired pneumonia)    CHF (congestive heart failure) (HCC)    Chronic pain syndrome    Common bile duct stone    Depression    Diverticulosis of colon    Duodenal ulcer hemorrhagic-after biliary sphincterotomy    Dysthymia     Esophagitis    EtOH dependence ([place])    Gastritis    GERD (gastroesophageal reflux disease)    H/O chest pain Dec. 2013   no work up done   Hx of colonic polyps    Hypertension    Irritable bowel syndrome    Lymphocytic colitis 02/16/2020   Osteopenia    Osteoporosis    Pneumonia 2019   Polypharmacy    Reflux esophagitis    Right sided sciatica 12/22/2017   Tobacco use disorder    Unspecified chronic bronchitis (HCC)    Vertigo    Vitamin B12 deficiency    Wears glasses     Past Surgical History:  Procedure Laterality Date   APPENDECTOMY     BACK SURGERY  10-09   Dr. Patrice Paradise   BIOPSY  02/14/2020   Procedure: BIOPSY;  Surgeon: Gatha Mayer, MD;  Location: [facility];  Service: Endoscopy;;   CARPAL TUNNEL RELEASE Right 08/14/2014   Procedure: RIGHT CARPAL TUNNEL RELEASE AND INJECT LEFT THUMB;  Surgeon: Daryll Brod, MD;  Location: [place];  Service: Orthopedics;  Laterality: Right;   CHOLECYSTECTOMY N/A 02/22/2020   Procedure: LAPAROSCOPIC CHOLECYSTECTOMY WITH INTRAOPERATIVE CHOLANGIOGRAM;  Surgeon: Kieth Brightly Arta Bruce, MD;  Location: [facility];  Service: General;  Laterality: N/A;   COLONOSCOPY     COLONOSCOPY WITH PROPOFOL N/A 02/14/2020   Procedure: COLONOSCOPY WITH PROPOFOL;  Surgeon: Gatha Mayer, MD;  Location: [facility];  Service: Endoscopy;  Laterality: N/A;   ENDOSCOPIC RETROGRADE CHOLANGIOPANCREATOGRAPHY (ERCP) WITH PROPOFOL N/A 12/25/2019   Procedure: ENDOSCOPIC RETROGRADE CHOLANGIOPANCREATOGRAPHY (ERCP) WITH PROPOFOL;  Surgeon: Gatha Mayer, MD;  Location: [place];  Service: Gastroenterology;  Laterality: N/A;   ESOPHAGOGASTRODUODENOSCOPY (EGD) WITH PROPOFOL N/A 01/06/2020   Procedure: ESOPHAGOGASTRODUODENOSCOPY (EGD) WITH PROPOFOL;  Surgeon: Irene Shipper, MD;  Location[facility];  Service: Endoscopy;  Laterality: N/A;   HOT HEMOSTASIS N/A 01/06/2020   Procedure: HOT HEMOSTASIS (ARGON PLASMA COAGULATION/BICAP);  Surgeon: Irene Shipper, MD;  Location: [facility] END[facility];  Service: Endoscopy;  Laterality: N/A;   INTRAMEDULLARY (IM) NAIL INTERTROCHANTERIC Right 10/01/2018   Procedure: INTRAMEDULLARY (IM) NAIL INTERTROCHANTRIC;  Surgeon: Thornton Park, MD;  Location: [facility] ORS;  Service: Orthopedics;  Laterality: Right;   LAPAROSCOPY N/A 02/21/2013   Procedure: LAPAROSCOPY OPERATIVE;  Surgeon: Margarette Asal, MD;  Location: [place] [facility];  Service: Gynecology;  Laterality: N/A;  REQUESTING 5MM SCOPE WITH CAMERA   LEFT HEART CATH AND CORONARY ANGIOGRAPHY N/A 05/11/2019   Procedure: LEFT HEART CATH AND CORONARY ANGIOGRAPHY;  Surgeon: Sherren Mocha, MD;  Location: [place] CV [facility];  Service: Cardiovascular;  Laterality: N/A;   LEFT HEART CATH AND CORONARY ANGIOGRAPHY N/A 11/04/2021   Procedure: LEFT HEART CATH AND CORONARY ANGIOGRAPHY;  Surgeon: Martinique, Peter M, MD;  Location: [place] [facility];  Service: Cardiovascular;  Laterality: N/A;   REMOVAL OF STONES  12/25/2019   Procedure: Karlyn Agee;  Surgeon: Gatha Mayer, MD;  Location: [facility] ENDOSCOPY;  Service: Gastroenterology;;   Clide Deutscher  01/06/2020   Procedure: Clide Deutscher;  Surgeon: Irene Shipper, MD;  Location: [facility] ENDOSCOPY;  Service: Endoscopy;;   SPHINCTEROTOMY  12/25/2019   Procedure: Joan Mayans;  Surgeon: Gatha Mayer, MD;  Location: [facility] ENDOSCOPY;  Service: Gastroenterology;;   TONSILLECTOMY     TUBAL LIGATION     UPPER GASTROINTESTINAL ENDOSCOPY      Current Medications: Current Meds  Medication Sig   albuterol (VENTOLIN HFA) 108 (90 Base) MCG/ACT inhaler Inhale 1-2 puffs into the lungs every 6 (six) hours as needed for wheezing or shortness of breath.   AMBULATORY NON FORMULARY MEDICATION Take 10 mg by mouth daily. Budesonide DR 5 mg capsules   aspirin 81 MG EC tablet Take 1 tablet (81 mg total) by mouth daily. Swallow whole.   busPIRone (BUSPAR) 10 MG tablet Take 10 mg by mouth daily.   clopidogrel (PLAVIX) 75 MG tablet Take 1 tablet (75 mg total) by mouth  daily.   cyclobenzaprine (FLEXERIL) 5 MG tablet Take 1 tablet (5 mg total) by mouth 3 (three) times daily as needed for muscle spasms.   DULoxetine (CYMBALTA) 30 MG capsule Take 30 mg by mouth daily.   guaiFENesin (ROBITUSSIN) 100 MG/5ML liquid Take 5 mLs by mouth every 4 (four) hours as needed for cough or to loosen phlegm.   hyoscyamine (ANASPAZ) 0.125 MG TBDP disintergrating tablet Place 1 tablet (0.125 mg total) under the tongue every 6 (six) hours as needed ([name] cramps).   Melatonin 10 MG TABS Take 10 mg by mouth at bedtime as needed (sleep).   mupirocin ointment (BACTROBAN) 2 % Apply 1 application topically daily.   naloxone (NARCAN) nasal spray 4 mg/0.1 mL Place 1 spray into the nose as needed (overdose).   nicotine (NICODERM CQ - DOSED IN MG/24 HOURS) 21 mg/24hr patch Place 1 patch (21 mg total) onto the skin daily.   nitroGLYCERIN (NITROSTAT) 0.4 MG SL  tablet DISSOLVE ONE TABLET UNDER TONGUE AS NEEDED FOR ARM/CHEST PAIN.   Oxycodone HCl 10 MG TABS Take 1 tablet (10 mg total) by mouth 2 (two) times daily as needed. (Patient taking differently: Take 15 mg by mouth 4 (four) times daily as needed.)   pantoprazole (PROTONIX) 40 MG tablet TAKE (1) TABLET TWICE A DAY BEFORE MEALS. (Patient taking differently: Take 40 mg by mouth 2 (two) times daily before a meal.)   rosuvastatin (CRESTOR) 20 MG tablet Take 1 tablet (20 mg total) by mouth daily.   valACYclovir (VALTREX) 500 MG tablet Take 500 mg by mouth 2 (two) times daily as needed (flare ups).     Allergies:   Atorvastatin, Levofloxacin, Omnicef [cefdinir], Trazodone and nefazodone, Sulfa antibiotics, and Sulfonamide derivatives   Social History   Socioeconomic History   Marital status: Married    Spouse name: Not on file   Number of children: 4   Years of education: 16   Highest education level: Not on file  Occupational History   Occupation: retired    Comment: disablility  Tobacco Use   Smoking status: Every Day    Packs/day:  1.00    Years: 30.00    Pack years: 30.00    Types: Cigarettes   Smokeless tobacco: Never  Vaping Use   Vaping Use: Never used  Substance and Sexual Activity   Alcohol use: Yes    Alcohol/week: 3.0 standard drinks    Types: 3 Standard drinks or equivalent per week    Comment: occasional   Drug use: No   Sexual activity: Yes  Other Topics Concern   Not on file  Social History Narrative   Married x 38 yrs. Has BS degree in Horticulture from [place]. Currently resides in a house with her husband. 3 cats.  Fun: used to like to do a lot of things.    Denies religious beliefs that would effect health care.    lives with husband [name]; quit smoking- October 6th, 2019. ocasional alcohol. [employer] [2009] on disability sec to back issues.    Social Determinants of Health   Financial Resource Strain: Not on file  Food Insecurity: Not on file  Transportation Needs: Not on file  Physical Activity: Not on file  Stress: Not on file  Social Connections: Not on file     Family History:  The patient's  family history includes Alcohol abuse in her daughter; Bipolar disorder in her daughter; Colon cancer in her brother and father; Drug abuse in her daughter; Heart disease in her mother; Prostate cancer in her father; Skin cancer in her sister.   ROS:   Please see the history of present illness.    ROS All other systems reviewed and are negative.   PHYSICAL EXAM:   VS:  BP 136/80   Pulse 95   Ht 5\' 1"  (1.549 m)   Wt 107 lb 3.2 oz (48.6 kg)   SpO2 93%   BMI 20.26 kg/m   Physical Exam  GEN: Thin, in no acute distress  Neck: no JVD, carotid bruits, or masses Cardiac:RRR; no murmurs, rubs, or gallops  Respiratory: Decreased breath sounds throughout but clear GI: soft, nontender, nondistended, + BS Ext: Right arm at cath site at the ulnar artery with small hematoma, no bruit, tender to touch good brachial pulses.  Lower extremities without cyanosis, clubbing, or edema, Good  distal pulses bilaterally Neuro:  Alert and Oriented x 3 Psych: Anxious  Wt Readings from Last 3 Encounters:  12/02/21  107 lb 3.2 oz (48.6 kg)  11/03/21 109 lb 8 oz (49.7 kg)  10/21/21 107 lb 4 oz (48.6 kg)      Studies/Labs Reviewed:   EKG:  EKG is not ordered today.     Recent Labs: 06/24/2021: Magnesium 1.5 06/26/2021: ALT 8; TSH 1.05 11/03/2021: B Natriuretic Peptide 60.8 11/04/2021: BUN 8; Creatinine, Ser 0.88; Potassium 3.7; Sodium 137 11/05/2021: Hemoglobin 11.3; Platelets 227   Lipid Panel    Component Value Date/Time   CHOL 198 11/04/2021 0129   CHOL 160 05/16/2020 0815   TRIG 44 11/04/2021 0129   HDL 105 11/04/2021 0129   HDL 61 05/16/2020 0815   CHOLHDL 1.9 11/04/2021 0129   VLDL 9 11/04/2021 0129   LDLCALC 84 11/04/2021 0129   LDLCALC 73 05/16/2020 0815   LDLDIRECT 102.4 05/24/2008 1037    Additional studies/ records that were reviewed today include:   Cath: 11/04/21   Prox Cx to Mid Cx lesion is 30% stenosed.   Prox RCA lesion is 50% stenosed.   Prox LAD to Mid LAD lesion is 70% stenosed.   The left ventricular systolic function is normal.   LV end diastolic pressure is normal.   The left ventricular ejection fraction is 55-65% by visual estimate.   Moderate CAD with long 70% stenosis in the proximal to mid LAD with heavy calcification. The RCA has a focal 50% stenosis in the proximal vessel Good LV function Normal LVEDP   Plan: would recommend continued medical therapy. If she has refractory symptoms could consider PCI of the LAD but this would require atherectomy and stenting of a fairly long segment. Will review with rounding team.      Risk Assessment/Calculations:         ASSESSMENT:    1. Coronary artery disease involving native coronary artery of native heart without angina pectoris   2. Essential hypertension   3. Hyperlipidemia, unspecified hyperlipidemia type   4. Cigarette smoker   5. Hematoma of arm, right, initial encounter       PLAN:  In order of problems listed above:  CAD NSTEMI 10/2021  hsTn 269, EKG anteroseptal TWI, cath 70% p/mLAD with heavy calcification, 50% RCA-medical therapy and consider PCI LAD if refractory angina. No BB with COPD.  Patient denies angina.  Continue aspirin and Plavix, higher dose Crestor  HTN blood pressure stable today usually runs low so she is not on any medication  HLD LDL 84 Crestor increased check fasting lipid panel next month  COPD with ongoing tobacco use 1/2 ppd. Trying to quit but husband and daughter smoke.  Importance of smoking cessation discussed  Right ulnar hematoma at cath site with some swelling and tenderness.  We will check ultrasound  Shared Decision Making/Informed Consent        Medication Adjustments/Labs and Tests Ordered: Current medicines are reviewed at length with the patient today.  Concerns regarding medicines are outlined above.  Medication changes, Labs and Tests ordered today are listed in the Patient Instructions below. Patient Instructions  Medication Instructions:   Your physician recommends that you continue on your current medications as directed. Please refer to the Current Medication list given to you today.  *If you need a refill on your cardiac medications before your next appointment, please call your pharmacy*   Lab Work: [place]   If you have labs (blood work) drawn today and your tests are completely normal, you will receive your results only  by: MyChart Message (if you have MyChart) OR A paper copy in the mail If you have any lab test that is abnormal or we need to change your treatment, we will call you to review the results.   Testing/Procedures:  ASAP OPENING  TODAY  TOMORROW  OR NEXT DAY PER Ladelle Teodoro  DUE ACTIVE PAIN NOW PER CATHERIZATION .  Your physician has requested that you have a lower or upper extremity arterial duplex. This test is an ultrasound of the  arteries in the legs or arms. It looks at arterial blood flow in the legs and arms. Allow one hour for Lower and Upper Arterial scans. There are no restrictions or special instructions    Follow-Up: At [facility], you and your health needs are our priority.  As part of our continuing mission to provide you with exceptional heart care, we have created designated Provider Care Teams.  These Care Teams include your primary Cardiologist (physician) and Advanced Practice Providers (APPs -  Physician Assistants and Nurse Practitioners) who all work together to provide you with the care you need, when you need it.  We recommend signing up for the patient portal called "MyChart".  Sign up information is provided on this After Visit Summary.  MyChart is used to connect with patients for Virtual Visits (Telemedicine).  Patients are able to view lab/test results, encounter notes, upcoming appointments, etc.  Non-urgent messages can be sent to your provider as well.   To learn more about what you can do with MyChart, go to [email or web address].    Your next appointment:   1 month(s)  AS SCHEDULED   The format for your next appointment:   In Person  Provider:   Jenkins Rouge, MD   Other Instructions     Signed, Ermalinda Barrios, PA-C  12/02/2021 1:46 PM    [facility] [place], [place], [place]  [address] Phone: [phone]; Fax: [phone]

## 2021-12-02 ENCOUNTER — Encounter: Payer: Self-pay | Admitting: Physician Assistant

## 2021-12-02 ENCOUNTER — Other Ambulatory Visit: Payer: Self-pay

## 2021-12-02 ENCOUNTER — Ambulatory Visit (INDEPENDENT_AMBULATORY_CARE_PROVIDER_SITE_OTHER): Payer: Medicare HMO | Admitting: Physician Assistant

## 2021-12-02 VITALS — BP 136/80 | HR 95 | Ht 61.0 in | Wt 107.2 lb

## 2021-12-02 DIAGNOSIS — F17210 Nicotine dependence, cigarettes, uncomplicated: Secondary | ICD-10-CM | POA: Diagnosis not present

## 2021-12-02 DIAGNOSIS — E785 Hyperlipidemia, unspecified: Secondary | ICD-10-CM | POA: Diagnosis not present

## 2021-12-02 DIAGNOSIS — I2510 Atherosclerotic heart disease of native coronary artery without angina pectoris: Secondary | ICD-10-CM

## 2021-12-02 DIAGNOSIS — S40021A Contusion of right upper arm, initial encounter: Secondary | ICD-10-CM

## 2021-12-02 DIAGNOSIS — R69 Illness, unspecified: Secondary | ICD-10-CM | POA: Diagnosis not present

## 2021-12-02 DIAGNOSIS — I10 Essential (primary) hypertension: Secondary | ICD-10-CM | POA: Diagnosis not present

## 2021-12-02 NOTE — Patient Instructions (Addendum)
Medication Instructions:   Your physician recommends that you continue on your current medications as directed. Please refer to the Current Medication list given to you today.  *If you need a refill on your cardiac medications before your next appointment, please call your pharmacy*   Lab Work: [place]   If you have labs (blood work) drawn today and your tests are completely normal, you will receive your results only by: [place] (if you have MyChart) OR A paper copy in the mail If you have any lab test that is abnormal or we need to change your treatment, we will call you to review the results.   Testing/Procedures:  ASAP OPENING  TODAY  TOMORROW  OR NEXT DAY PER [place]  DUE ACTIVE PAIN NOW PER CATHERIZATION .  Your physician has requested that you have a lower or upper extremity arterial duplex. This test is an ultrasound of the arteries in the legs or arms. It looks at arterial blood flow in the legs and arms. Allow one hour for Lower and Upper Arterial scans. There are no restrictions or special instructions    Follow-Up: At [facility], you and your health needs are our priority.  As part of our continuing mission to provide you with exceptional heart care, we have created designated Provider Care Teams.  These Care Teams include your primary Cardiologist (physician) and Advanced Practice Providers (APPs -  Physician Assistants and Nurse Practitioners) who all work together to provide you with the care you need, when you need it.  We recommend signing up for the patient portal called "MyChart".  Sign up information is provided on this After Visit Summary.  MyChart is used to connect with patients for Virtual Visits (Telemedicine).  Patients are able to view lab/test results, encounter notes, upcoming appointments, etc.  Non-urgent messages can be sent to your provider as well.   To learn more about what you can do with  MyChart, go to [email or web address].    Your next appointment:   1 month(s)  AS SCHEDULED   The format for your next appointment:   In Person  Provider:   Jenkins Rouge, MD   Other Instructions

## 2021-12-03 ENCOUNTER — Ambulatory Visit (HOSPITAL_COMMUNITY)
Admission: RE | Admit: 2021-12-03 | Discharge: 2021-12-03 | Disposition: A | Discharge status: Home / Self Care | Payer: Medicare HMO | Source: Ambulatory Visit | Attending: Cardiovascular Disease | Admitting: Cardiovascular Disease

## 2021-12-03 DIAGNOSIS — M79631 Pain in right forearm: Secondary | ICD-10-CM

## 2021-12-03 DIAGNOSIS — S40021A Contusion of right upper arm, initial encounter: Secondary | ICD-10-CM

## 2021-12-03 DIAGNOSIS — I509 Heart failure, unspecified: Secondary | ICD-10-CM | POA: Diagnosis not present

## 2021-12-04 ENCOUNTER — Telehealth: Payer: Self-pay | Admitting: Physician Assistant

## 2021-12-04 NOTE — Telephone Encounter (Signed)
Pt is calling for [place] results

## 2021-12-04 NOTE — Telephone Encounter (Signed)
Left the pt a message to call the office back. 

## 2021-12-07 ENCOUNTER — Ambulatory Visit
Admission: EM | Admit: 2021-12-07 | Discharge: 2021-12-07 | Disposition: A | Discharge status: Home / Self Care | Payer: Medicare HMO | Attending: Physician Assistant | Admitting: Physician Assistant

## 2021-12-07 ENCOUNTER — Ambulatory Visit (INDEPENDENT_AMBULATORY_CARE_PROVIDER_SITE_OTHER): Payer: Medicare HMO

## 2021-12-07 ENCOUNTER — Other Ambulatory Visit: Payer: Self-pay

## 2021-12-07 DIAGNOSIS — M25531 Pain in right wrist: Secondary | ICD-10-CM | POA: Diagnosis not present

## 2021-12-07 DIAGNOSIS — M7989 Other specified soft tissue disorders: Secondary | ICD-10-CM | POA: Diagnosis not present

## 2021-12-07 NOTE — ED Provider Notes (Signed)
[name]    CSN: [id number] Arrival date & time: 12/07/21  0940      History   Chief Complaint Chief Complaint  Patient presents with   Wrist Pain    right    HPI Denise King is a 70 y.o. female.   Patient here today for evaluation of swelling and discomfort to her right wrist that started after cardiac cath about a month ago.  She reports that pain seems to be worsening with time.  Movement makes pain worse specifically things like opening her car door.  She states that she has seen her cardiologist and has had ultrasound with good blood flow and no concerns from that standpoint after cath.  She does not report any numbness.  The history is provided by the patient.  Wrist Pain Pertinent negatives include no abdominal pain and no shortness of breath.   Past Medical History:  Diagnosis Date   Acute cystitis    Acute respiratory failure ([place])    Allergy    as a child grew out of them   Anemia    pernicious anemia   Anxiety    Arthritis    Back pain    Blood transfusion    CAP (community acquired pneumonia)    CHF (congestive heart failure) (HCC)    Chronic pain syndrome    Common bile duct stone    Depression    Diverticulosis of colon    Duodenal ulcer hemorrhagic-after biliary sphincterotomy    Dysthymia    Esophagitis    EtOH dependence ([place])    Gastritis    GERD (gastroesophageal reflux disease)    H/O chest pain Dec. 2013   no work up done   Hx of colonic polyps    Hypertension    Irritable bowel syndrome    Lymphocytic colitis 02/16/2020   Osteopenia    Osteoporosis    Pneumonia 2019   Polypharmacy    Reflux esophagitis    Right sided sciatica 12/22/2017   Tobacco use disorder    Unspecified chronic bronchitis (HCC)    Vertigo    Vitamin B12 deficiency    Wears glasses     Patient Active Problem List   Diagnosis Date Noted   NSTEMI (non-ST elevated myocardial infarction) ([place]) 11/03/2021   COPD (chronic obstructive  pulmonary disease) ([place]) 06/30/2021   Vitamin D deficiency 06/30/2021   Chronic hypoxemic respiratory failure ([place]) 06/30/2021   Fatigue 06/26/2021   Acute on chronic respiratory failure ([place]) 06/22/2021   T8 vertebral fracture ([place]) 06/22/2021   Compression fracture of thoracic vertebra ([place]) 05/16/2021   Aortic atherosclerosis ([place]) 03/13/2021   Genital herpes simplex 03/12/2021   Hyperlipidemia 03/12/2021   Severe sepsis ([place]) 02/10/2021   Acute cystitis 02/10/2021   Left hip pain 64/40/3474   Acute metabolic encephalopathy 25/95/6387   Toe fracture, right 02/01/2021   Concussion 02/01/2021   Floaters, bilateral 01/31/2021   Acute on chronic respiratory failure with hypoxia and hypercapnia ([place]) 07/10/2020   Acute on chronic heart failure ([place]) 07/04/2020   Choledocholithiasis 02/22/2020   Lymphocytic colitis 02/16/2020   Duodenal ulcer hemorrhagic    Malnutrition of moderate degree 01/01/2020   Acute respiratory failure with hypoxia and hypercapnia ([place]) 12/28/2019   Alcohol abuse 12/28/2019   Acute on chronic heart failure with preserved ejection fraction ([place]) 12/28/2019   Abnormal MRI of abdomen    Cholelithiasis    Common bile duct stone 12/23/2019   Acute cholecystitis 12/23/2019   Polycythemia 09/11/2019  Right knee pain 08/09/2019   Quadriceps contusion 08/09/2019   Abnormal cardiac CT angiography 05/11/2019   Chest pain 04/11/2019   Acute viral syndrome 04/11/2019   Hyperphosphatemia 04/11/2019   Osteoporosis 01/03/2019   Closed fracture of neck of femur ([place]) 10/17/2018   Hip fracture ([place]) 10/01/2018   Dysuria 05/20/2018   Right leg swelling 05/20/2018   CAP (community acquired pneumonia) 05/13/2018   Tachycardia 05/13/2018   Chest wall pain 05/13/2018   Narcotic poisoning ([place]) 05/13/2018   EtOH dependence ([place])    Polypharmacy    Esophagitis    Encounter for well adult exam with abnormal findings 12/22/2017   Erythrocytosis 12/22/2017   Depression  12/22/2017   Hyperglycemia 12/22/2017   Right sided sciatica 12/22/2017   Cough 06/10/2017   Family history of colon cancer - brother and father 03/10/2016   Insomnia 02/11/2016   Generalized anxiety disorder 11/25/2015   Urinary frequency 04/25/2015   Nausea and vomiting in adult 11/08/2014   Spinal stenosis of lumbar region without neurogenic claudication 02/14/2014   Cigarette smoker 08/15/2013   Otitis media 10/27/2012   Abdominal pain 03/30/2012   HEPATIC CYST 02/18/2010   Chronic pain 02/08/2010   Vitamin B12 deficiency 10/04/2009   GERD 01/04/2009   ABDOMINAL PAIN-EPIGASTRIC 01/04/2009   Venous (peripheral) insufficiency 08/10/2008   Irritable bowel syndrome 08/10/2008   Back pain 08/10/2008   Hx of adenomatous polyp of colon 12/06/2007   BRONCHITIS, RECURRENT 12/06/2007   DIVERTICULOSIS OF COLON 12/06/2007   Other forms of scoliosis, lumbosacral region 12/06/2007   CALCULUS, KIDNEY 10/07/2007   VERTIGO 10/07/2007   Headache(784.0) 10/07/2007    Past Surgical History:  Procedure Laterality Date   APPENDECTOMY     BACK SURGERY  10-09   Dr. Patrice Paradise   BIOPSY  02/14/2020   Procedure: BIOPSY;  Surgeon: Gatha Mayer, MD;  Location: [facility];  Service: Endoscopy;;   CARPAL TUNNEL RELEASE Right 08/14/2014   Procedure: RIGHT CARPAL TUNNEL RELEASE AND INJECT LEFT THUMB;  Surgeon: Daryll Brod, MD;  Location: [place];  Service: Orthopedics;  Laterality: Right;   CHOLECYSTECTOMY N/A 02/22/2020   Procedure: LAPAROSCOPIC CHOLECYSTECTOMY WITH INTRAOPERATIVE CHOLANGIOGRAM;  Surgeon: Kieth Brightly Arta Bruce, MD;  Location: [facility];  Service: General;  Laterality: N/A;   COLONOSCOPY     COLONOSCOPY WITH PROPOFOL N/A 02/14/2020   Procedure: COLONOSCOPY WITH PROPOFOL;  Surgeon: Gatha Mayer, MD;  Location: [facility];  Service: Endoscopy;  Laterality: N/A;   ENDOSCOPIC RETROGRADE CHOLANGIOPANCREATOGRAPHY (ERCP) WITH PROPOFOL N/A 12/25/2019   Procedure: ENDOSCOPIC  RETROGRADE CHOLANGIOPANCREATOGRAPHY (ERCP) WITH PROPOFOL;  Surgeon: Gatha Mayer, MD;  Location: [place];  Service: Gastroenterology;  Laterality: N/A;   ESOPHAGOGASTRODUODENOSCOPY (EGD) WITH PROPOFOL N/A 01/06/2020   Procedure: ESOPHAGOGASTRODUODENOSCOPY (EGD) WITH PROPOFOL;  Surgeon: Irene Shipper, MD;  Location[facility] END[facility]COPY;  Service: Endoscopy;  Laterality: N/A;   HOT HEMOSTASIS N/A 01/06/2020   Procedure: HOT HEMOSTASIS (ARGON PLASMA COAGULATION/BICAP);  Surgeon: Irene Shipper, MD;  Location: [facility] ENDOSCOPY;  Service: Endoscopy;  Laterality: N/A;   INTRAMEDULLARY (IM) NAIL INTERTROCHANTERIC Right 10/01/2018   Procedure: INTRAMEDULLARY (IM) NAIL INTERTROCHANTRIC;  Surgeon: Thornton Park, MD;  Location: [facility];  Service: Orthopedics;  Laterality: Right;   LAPAROSCOPY N/A 02/21/2013   Procedure: LAPAROSCOPY OPERATIVE;  Surgeon: Margarette Asal, MD;  Location: [facility];  Service: Gynecology;  Laterality: N/A;  REQUESTING 5MM SCOPE WITH CAMERA   LEFT HEART CATH AND CORONARY ANGIOGRAPHY N/A 05/11/2019   Procedure: LEFT HEART CATH AND CORONARY ANGIOGRAPHY;  Surgeon: Sherren Mocha, MD;  Location: [place] [facility];  Service: Cardiovascular;  Laterality: N/A;   LEFT HEART CATH AND CORONARY ANGIOGRAPHY N/A 11/04/2021   Procedure: LEFT HEART CATH AND CORONARY ANGIOGRAPHY;  Surgeon: Martinique, Peter M, MD;  Location: [place] [facility];  Service: Cardiovascular;  Laterality: N/A;   REMOVAL OF STONES  12/25/2019   Procedure: Karlyn Agee;  Surgeon: Gatha Mayer, MD;  Location: [facility] ENDOSCOPY;  Service: Gastroenterology;;   Clide Deutscher  01/06/2020   Procedure: Clide Deutscher;  Surgeon: Irene Shipper, MD;  Location: [facility] ENDOSCOPY;  Service: Endoscopy;;   SPHINCTEROTOMY  12/25/2019   Procedure: Joan Mayans;  Surgeon: Gatha Mayer, MD;  Location: [facility] ENDOSCOPY;  Service: Gastroenterology;;   TONSILLECTOMY     TUBAL LIGATION     UPPER GASTROINTESTINAL ENDOSCOPY      OB History   No obstetric  history on file.      Home Medications    Prior to Admission medications   Medication Sig Start Date End Date Taking? Authorizing Provider  albuterol (VENTOLIN HFA) 108 (90 Base) MCG/ACT inhaler Inhale 1-2 puffs into the lungs every 6 (six) hours as needed for wheezing or shortness of breath.    [provider]  AMBULATORY NON FORMULARY MEDICATION Take 10 mg by mouth daily. Budesonide DR 5 mg capsules 10/23/21   Zehr, Laban Emperor, PA-C  aspirin 81 MG EC tablet Take 1 tablet (81 mg total) by mouth daily. Swallow whole. 11/06/21   Cheryln Manly, NP  budesonide (ENTOCORT EC) 3 MG 24 hr capsule TAKE 3 CAPSULES ONCE DAILY. Patient not taking: Reported on 11/03/2021 05/13/21   Gatha Mayer, MD  buPROPion [facility] SR) 150 MG 12 hr tablet Take 1 tablet (150 mg total) by mouth daily. 04/23/21 11/03/21  Elvin So, MD  busPIRone (BUSPAR) 10 MG tablet Take 10 mg by mouth daily. 10/29/21   [provider]  clopidogrel (PLAVIX) 75 MG tablet Take 1 tablet (75 mg total) by mouth daily. 11/06/21   Cheryln Manly, NP  cyclobenzaprine (FLEXERIL) 5 MG tablet Take 1 tablet (5 mg total) by mouth 3 (three) times daily as needed for muscle spasms. 06/04/21   Biagio Borg, MD  denosumab (PROLIA) 60 MG/ML SOSY injection Inject 60 mg into the skin every 6 (six) months. Patient not taking: Reported on 11/03/2021    [provider]  diclofenac Sodium (VOLTAREN) 1 % GEL Apply 2 g topically 4 (four) times daily as needed (for joint pain). Patient not taking: Reported on 11/03/2021 02/21/21   Elodia [place]., MD  DULoxetine (CYMBALTA) 30 MG capsule Take 30 mg by mouth daily.    [provider]  guaiFENesin (ROBITUSSIN) 100 MG/5ML liquid Take 5 mLs by mouth every 4 (four) hours as needed for cough or to loosen phlegm.    [provider]  hyoscyamine (ANASPAZ) 0.125 MG TBDP disintergrating tablet Place 1 tablet (0.125 mg total) under the tongue every 6 (six) hours  as needed ([name] cramps). 04/09/21   Gatha Mayer, MD  Melatonin 10 MG TABS Take 10 mg by mouth at bedtime as needed (sleep).    [provider]  mupirocin ointment (BACTROBAN) 2 % Apply 1 application topically daily. 11/06/21   Raspet, Derry Skill, PA-C  naloxone (NARCAN) nasal spray 4 mg/0.1 mL Place 1 spray into the nose as needed (overdose).    [provider]  nicotine (NICODERM CQ - DOSED IN MG/24 HOURS) 21 mg/24hr patch Place 1 patch (21  mg total) onto the skin daily. 01/05/20   Swayze, Ava, DO  nitroGLYCERIN (NITROSTAT) 0.4 MG SL tablet DISSOLVE ONE TABLET UNDER TONGUE AS NEEDED FOR ARM/CHEST PAIN. 11/17/21   Josue Hector, MD  Oxycodone HCl 10 MG TABS Take 1 tablet (10 mg total) by mouth 2 (two) times daily as needed. Patient taking differently: Take 15 mg by mouth 4 (four) times daily as needed. 06/24/21   Thurnell Lose, MD  pantoprazole (PROTONIX) 40 MG tablet TAKE (1) TABLET TWICE A DAY BEFORE MEALS. Patient taking differently: Take 40 mg by mouth 2 (two) times daily before a meal. 01/29/21   Gatha Mayer, MD  rosuvastatin (CRESTOR) 20 MG tablet Take 1 tablet (20 mg total) by mouth daily. 11/06/21   Cheryln Manly, NP  sertraline (ZOLOFT) 100 MG tablet Take 2 tablets (200 mg total) by mouth at bedtime. 04/23/21 11/03/21  Elvin So, MD  valACYclovir (VALTREX) 500 MG tablet Take 500 mg by mouth 2 (two) times daily as needed (flare ups).    [provider]    Family History Family History  Problem Relation Age of Onset   Heart disease Mother        prev MVR, also has DJD   Colon cancer Father    Prostate cancer Father    Skin cancer Sister    Colon cancer Brother    Alcohol abuse Daughter    Bipolar disorder Daughter    Drug abuse Daughter    Esophageal cancer Neg Hx    Rectal cancer Neg Hx    Stomach cancer Neg Hx     Social History Social History   Tobacco Use   Smoking status: Every Day    Packs/day: 1.00    Years: 30.00    Pack  years: 30.00    Types: Cigarettes   Smokeless tobacco: Never  Vaping Use   Vaping Use: Never used  Substance Use Topics   Alcohol use: Yes    Alcohol/week: 3.0 standard drinks    Types: 3 Standard drinks or equivalent per week    Comment: occasional   Drug use: No     Allergies   Atorvastatin, Levofloxacin, Omnicef [cefdinir], Trazodone and nefazodone, Sulfa antibiotics, and Sulfonamide derivatives   Review of Systems Review of Systems  Constitutional:  Negative for chills and fever.  Eyes:  Negative for discharge and redness.  Respiratory:  Negative for shortness of breath.   Gastrointestinal:  Negative for abdominal pain, nausea and vomiting.  Genitourinary:  Positive for vaginal bleeding and vaginal discharge.  Musculoskeletal:  Positive for arthralgias and joint swelling.    Physical Exam Triage Vital Signs ED Triage Vitals  Enc Vitals Group     BP 12/07/21 1111 (!) 145/78     Pulse Rate 12/07/21 1111 (!) 105     Resp 12/07/21 1111 20     Temp 12/07/21 1111 98 F (36.7 C)     Temp Source 12/07/21 1111 Oral     SpO2 12/07/21 1111 93 %     Weight --      Height --      Head Circumference --      Peak Flow --      Pain Score 12/07/21 1115 3     Pain Loc --      Pain Edu? --      Excl. in [place]? --    No data found.  Updated Vital Signs BP (!) 145/78 (BP Location: Left Arm)   Pulse Marland Kitchen)  105   Temp 98 F (36.7 C) (Oral)   Resp 20   SpO2 93%      Physical Exam Vitals and nursing note reviewed.  Constitutional:      General: She is not in acute distress.    Appearance: Normal appearance. She is not ill-appearing.  HENT:     Head: Normocephalic and atraumatic.  Eyes:     Conjunctiva/sclera: Conjunctivae normal.  Cardiovascular:     Rate and Rhythm: Normal rate.  Pulmonary:     Effort: Pulmonary effort is normal.  Musculoskeletal:     Comments: Full range of motion of right wrist with some pain noted at extremes.  Minimal tenderness to palpation noted  to anterior wrist diffusely.  Neurological:     Mental Status: She is alert.  Psychiatric:        Mood and Affect: Mood normal.        Behavior: Behavior normal.        Thought Content: Thought content normal.     UC Treatments / Results  Labs (all labs ordered are listed, but only abnormal results are displayed) Labs Reviewed - No data to display  EKG   Radiology DG Wrist Complete Right  Result Date: 12/07/2021 CLINICAL DATA:  Swelling and pain EXAM: RIGHT WRIST - COMPLETE 3+ VIEW COMPARISON:  None. FINDINGS: No acute fracture or dislocation. Mild-to-moderate degenerative changes of the triscaphe joint. Mild degenerative changes of the first [facility] joint with mild subluxation. Soft tissues are unremarkable. IMPRESSION: No acute osseous abnormality. Electronically Signed   By: Yetta Glassman M.D.   On: 12/07/2021 11:43    Procedures Procedures (including critical care time)  Medications Ordered in UC Medications - No data to display  Initial Impression / Assessment and Plan / UC Course  I have reviewed the triage vital signs and the nursing notes.  Pertinent labs & imaging results that were available during my care of the patient were reviewed by me and considered in my medical decision making (see chart for details).    Xray was ordered as patient was concerned with occult fracture.  X-ray was normal and discussed this with patient.  Recommended she try to wear a wrist brace for support and follow-up if symptoms do not improve with same.  Final Clinical Impressions(s) / UC Diagnoses   Final diagnoses:  Right wrist pain   Discharge Instructions   None    ED Prescriptions   None    PDMP not reviewed this encounter.   Francene Finders, PA-C 12/07/21 1453

## 2021-12-07 NOTE — ED Triage Notes (Signed)
Approx 34mo ago, Pt underwent a cardiac catheterization. Pt reports since then she's had pain and swelling in her right wrist that increases with everyday activities such as driving and trying to open items. Pt was worked by cardio this week for sxs and per Pt they did not find any reason for the sxs. No meds taken.

## 2021-12-08 ENCOUNTER — Telehealth: Payer: Self-pay | Admitting: Gastroenterology

## 2021-12-08 NOTE — Telephone Encounter (Signed)
See result note.  

## 2021-12-08 NOTE — Telephone Encounter (Signed)
Patient called to request refill on Budesonide states the pharmacy does not have it.

## 2021-12-09 DIAGNOSIS — I509 Heart failure, unspecified: Secondary | ICD-10-CM | POA: Diagnosis not present

## 2021-12-09 MED ORDER — AMBULATORY NON FORMULARY MEDICATION: 10.0000 mg | Freq: Every day | 2 refills | Status: AC

## 2021-12-09 MED ORDER — AMBULATORY NON FORMULARY MEDICATION: 10.0000 mg | Freq: Every day | 2 refills | Status: DC

## 2021-12-09 NOTE — Telephone Encounter (Signed)
Refaxed prescription to [facility] in [place]. Patient informed.

## 2021-12-11 DIAGNOSIS — S59901A Unspecified injury of right elbow, initial encounter: Secondary | ICD-10-CM | POA: Diagnosis not present

## 2021-12-11 DIAGNOSIS — S199XXA Unspecified injury of neck, initial encounter: Secondary | ICD-10-CM | POA: Diagnosis not present

## 2021-12-11 DIAGNOSIS — S4991XA Unspecified injury of right shoulder and upper arm, initial encounter: Secondary | ICD-10-CM | POA: Diagnosis not present

## 2021-12-11 DIAGNOSIS — S299XXA Unspecified injury of thorax, initial encounter: Secondary | ICD-10-CM | POA: Diagnosis not present

## 2021-12-11 DIAGNOSIS — S0990XA Unspecified injury of head, initial encounter: Secondary | ICD-10-CM | POA: Diagnosis not present

## 2021-12-11 DIAGNOSIS — S52571A Other intraarticular fracture of lower end of right radius, initial encounter for closed fracture: Secondary | ICD-10-CM | POA: Diagnosis not present

## 2021-12-11 DIAGNOSIS — S0993XA Unspecified injury of face, initial encounter: Secondary | ICD-10-CM | POA: Diagnosis not present

## 2021-12-12 DIAGNOSIS — G936 Cerebral edema: Secondary | ICD-10-CM | POA: Diagnosis not present

## 2021-12-12 DIAGNOSIS — I700 Atherosclerosis of aorta: Secondary | ICD-10-CM | POA: Diagnosis not present

## 2021-12-12 DIAGNOSIS — W1830XA Fall on same level, unspecified, initial encounter: Secondary | ICD-10-CM | POA: Diagnosis not present

## 2021-12-12 DIAGNOSIS — S0990XA Unspecified injury of head, initial encounter: Secondary | ICD-10-CM | POA: Diagnosis not present

## 2021-12-12 DIAGNOSIS — S069X9A Unspecified intracranial injury with loss of consciousness of unspecified duration, initial encounter: Secondary | ICD-10-CM | POA: Diagnosis not present

## 2021-12-12 DIAGNOSIS — K838 Other specified diseases of biliary tract: Secondary | ICD-10-CM | POA: Diagnosis not present

## 2021-12-12 DIAGNOSIS — J9690 Respiratory failure, unspecified, unspecified whether with hypoxia or hypercapnia: Secondary | ICD-10-CM | POA: Diagnosis not present

## 2021-12-12 DIAGNOSIS — I615 Nontraumatic intracerebral hemorrhage, intraventricular: Secondary | ICD-10-CM | POA: Diagnosis not present

## 2021-12-12 DIAGNOSIS — S62101A Fracture of unspecified carpal bone, right wrist, initial encounter for closed fracture: Secondary | ICD-10-CM | POA: Diagnosis not present

## 2021-12-12 DIAGNOSIS — Z4682 Encounter for fitting and adjustment of non-vascular catheter: Secondary | ICD-10-CM | POA: Diagnosis not present

## 2021-12-12 DIAGNOSIS — S20213A Contusion of bilateral front wall of thorax, initial encounter: Secondary | ICD-10-CM | POA: Diagnosis not present

## 2021-12-12 DIAGNOSIS — Z515 Encounter for palliative care: Secondary | ICD-10-CM | POA: Diagnosis not present

## 2021-12-12 DIAGNOSIS — Z452 Encounter for adjustment and management of vascular access device: Secondary | ICD-10-CM | POA: Diagnosis not present

## 2021-12-12 DIAGNOSIS — Z20822 Contact with and (suspected) exposure to covid-19: Secondary | ICD-10-CM | POA: Diagnosis not present

## 2021-12-12 DIAGNOSIS — J432 Centrilobular emphysema: Secondary | ICD-10-CM | POA: Diagnosis not present

## 2021-12-12 DIAGNOSIS — S066X0A Traumatic subarachnoid hemorrhage without loss of consciousness, initial encounter: Secondary | ICD-10-CM | POA: Diagnosis not present

## 2021-12-12 DIAGNOSIS — S0993XA Unspecified injury of face, initial encounter: Secondary | ICD-10-CM | POA: Diagnosis not present

## 2021-12-12 DIAGNOSIS — W109XXA Fall (on) (from) unspecified stairs and steps, initial encounter: Secondary | ICD-10-CM | POA: Diagnosis not present

## 2021-12-12 DIAGNOSIS — N3289 Other specified disorders of bladder: Secondary | ICD-10-CM | POA: Diagnosis not present

## 2021-12-12 DIAGNOSIS — S52571A Other intraarticular fracture of lower end of right radius, initial encounter for closed fracture: Secondary | ICD-10-CM | POA: Diagnosis not present

## 2021-12-12 DIAGNOSIS — S065X0A Traumatic subdural hemorrhage without loss of consciousness, initial encounter: Secondary | ICD-10-CM | POA: Diagnosis not present

## 2021-12-12 DIAGNOSIS — Z7902 Long term (current) use of antithrombotics/antiplatelets: Secondary | ICD-10-CM | POA: Diagnosis not present

## 2021-12-12 DIAGNOSIS — Y92009 Unspecified place in unspecified non-institutional (private) residence as the place of occurrence of the external cause: Secondary | ICD-10-CM | POA: Diagnosis not present

## 2021-12-12 DIAGNOSIS — S59901A Unspecified injury of right elbow, initial encounter: Secondary | ICD-10-CM | POA: Diagnosis not present

## 2021-12-12 DIAGNOSIS — G9389 Other specified disorders of brain: Secondary | ICD-10-CM | POA: Diagnosis not present

## 2021-12-12 DIAGNOSIS — J9811 Atelectasis: Secondary | ICD-10-CM | POA: Diagnosis not present

## 2021-12-12 DIAGNOSIS — S0636AA Traumatic hemorrhage of cerebrum, unspecified, with loss of consciousness status unknown, initial encounter: Secondary | ICD-10-CM | POA: Diagnosis not present

## 2021-12-12 DIAGNOSIS — S066X9A Traumatic subarachnoid hemorrhage with loss of consciousness of unspecified duration, initial encounter: Secondary | ICD-10-CM | POA: Diagnosis not present

## 2021-12-12 DIAGNOSIS — S065X9A Traumatic subdural hemorrhage with loss of consciousness of unspecified duration, initial encounter: Secondary | ICD-10-CM | POA: Diagnosis not present

## 2021-12-12 DIAGNOSIS — J984 Other disorders of lung: Secondary | ICD-10-CM | POA: Diagnosis not present

## 2021-12-12 DIAGNOSIS — I609 Nontraumatic subarachnoid hemorrhage, unspecified: Secondary | ICD-10-CM | POA: Diagnosis not present

## 2021-12-12 DIAGNOSIS — I10 Essential (primary) hypertension: Secondary | ICD-10-CM | POA: Diagnosis not present

## 2021-12-12 DIAGNOSIS — Z9049 Acquired absence of other specified parts of digestive tract: Secondary | ICD-10-CM | POA: Diagnosis not present

## 2021-12-12 DIAGNOSIS — I7090 Unspecified atherosclerosis: Secondary | ICD-10-CM | POA: Diagnosis not present

## 2021-12-12 DIAGNOSIS — G935 Compression of brain: Secondary | ICD-10-CM | POA: Diagnosis not present

## 2021-12-12 DIAGNOSIS — I2510 Atherosclerotic heart disease of native coronary artery without angina pectoris: Secondary | ICD-10-CM | POA: Diagnosis not present

## 2021-12-12 DIAGNOSIS — J9601 Acute respiratory failure with hypoxia: Secondary | ICD-10-CM | POA: Diagnosis not present

## 2021-12-12 DIAGNOSIS — S4991XA Unspecified injury of right shoulder and upper arm, initial encounter: Secondary | ICD-10-CM | POA: Diagnosis not present

## 2021-12-12 DIAGNOSIS — S199XXA Unspecified injury of neck, initial encounter: Secondary | ICD-10-CM | POA: Diagnosis not present

## 2021-12-12 DIAGNOSIS — S065XAA Traumatic subdural hemorrhage with loss of consciousness status unknown, initial encounter: Secondary | ICD-10-CM | POA: Diagnosis not present

## 2021-12-12 DIAGNOSIS — W19XXXA Unspecified fall, initial encounter: Secondary | ICD-10-CM | POA: Diagnosis not present

## 2021-12-12 DIAGNOSIS — R402431 Glasgow coma scale score 3-8, in the field [EMT or ambulance]: Secondary | ICD-10-CM | POA: Diagnosis not present

## 2021-12-12 DIAGNOSIS — S066XAA Traumatic subarachnoid hemorrhage with loss of consciousness status unknown, initial encounter: Secondary | ICD-10-CM | POA: Diagnosis not present

## 2021-12-12 DIAGNOSIS — I161 Hypertensive emergency: Secondary | ICD-10-CM | POA: Diagnosis not present

## 2021-12-12 DIAGNOSIS — Z87891 Personal history of nicotine dependence: Secondary | ICD-10-CM | POA: Diagnosis not present

## 2021-12-12 DIAGNOSIS — S06360A Traumatic hemorrhage of cerebrum, unspecified, without loss of consciousness, initial encounter: Secondary | ICD-10-CM | POA: Diagnosis not present

## 2021-12-12 DIAGNOSIS — G9382 Brain death: Secondary | ICD-10-CM | POA: Diagnosis not present

## 2021-12-12 DIAGNOSIS — S3991XA Unspecified injury of abdomen, initial encounter: Secondary | ICD-10-CM | POA: Diagnosis not present

## 2021-12-12 DIAGNOSIS — S299XXA Unspecified injury of thorax, initial encounter: Secondary | ICD-10-CM | POA: Diagnosis not present

## 2021-12-13 DIAGNOSIS — Y92009 Unspecified place in unspecified non-institutional (private) residence as the place of occurrence of the external cause: Secondary | ICD-10-CM | POA: Diagnosis not present

## 2021-12-13 DIAGNOSIS — G935 Compression of brain: Secondary | ICD-10-CM | POA: Diagnosis not present

## 2021-12-13 DIAGNOSIS — G9382 Brain death: Secondary | ICD-10-CM | POA: Diagnosis not present

## 2021-12-13 DIAGNOSIS — I10 Essential (primary) hypertension: Secondary | ICD-10-CM | POA: Diagnosis not present

## 2021-12-13 DIAGNOSIS — I161 Hypertensive emergency: Secondary | ICD-10-CM | POA: Diagnosis not present

## 2021-12-13 DIAGNOSIS — J9601 Acute respiratory failure with hypoxia: Secondary | ICD-10-CM | POA: Diagnosis not present

## 2021-12-13 DIAGNOSIS — W1830XA Fall on same level, unspecified, initial encounter: Secondary | ICD-10-CM | POA: Diagnosis not present

## 2021-12-13 DIAGNOSIS — S20213A Contusion of bilateral front wall of thorax, initial encounter: Secondary | ICD-10-CM | POA: Diagnosis not present

## 2021-12-15 DIAGNOSIS — G9382 Brain death: Secondary | ICD-10-CM | POA: Diagnosis not present

## 2021-12-28 DEATH — deceased

## 2021-12-30 ENCOUNTER — Ambulatory Visit: Payer: Medicare HMO | Admitting: Internal Medicine

## 2022-01-23 ENCOUNTER — Ambulatory Visit: Payer: Medicare HMO | Admitting: Cardiovascular Disease

## 2022-01-23 ENCOUNTER — Other Ambulatory Visit: Payer: Medicare HMO

## 2022-03-02 IMAGING — CT CT ABD-PELV W/ CM
2 of 5 series · 16 of 46 positions shown, 18 images · IV contrast (APPLIED)
Comparison: 12/23/2019

CLINICAL DATA: Right upper quadrant abdominal pain, nausea and
vomiting, diarrhea, symptoms for 3 days

EXAM:
CT ABDOMEN AND PELVIS WITH CONTRAST
TECHNIQUE: Multidetector CT imaging of the abdomen and pelvis was performed
using the standard protocol following bolus administration of
intravenous contrast.
CONTRAST:  100mL OMNIPAQUE IOHEXOL 300 MG/ML  SOLN

[Series 3: abdomen 5.0 · axial · 0.67mm/px · z∈[-425,-85]mm · 13 of 78 slices shown, 15 images]
[im 5/78  soft-tissue]
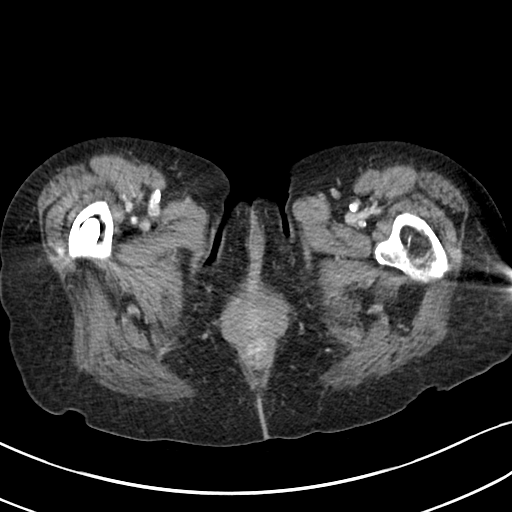
[im 5/78  bone]
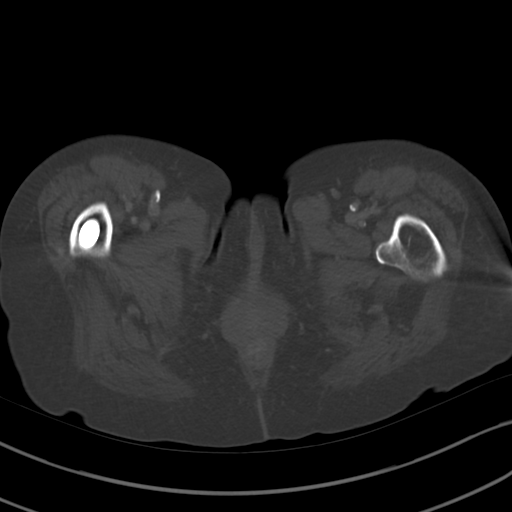
[im 10/78  soft-tissue]
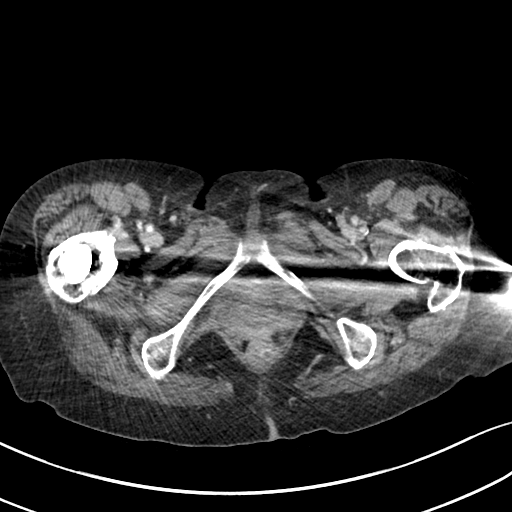
[im 19/78  soft-tissue]
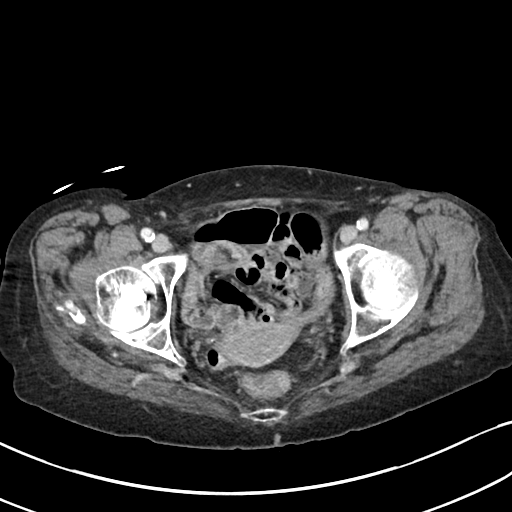
[im 23/78  soft-tissue]
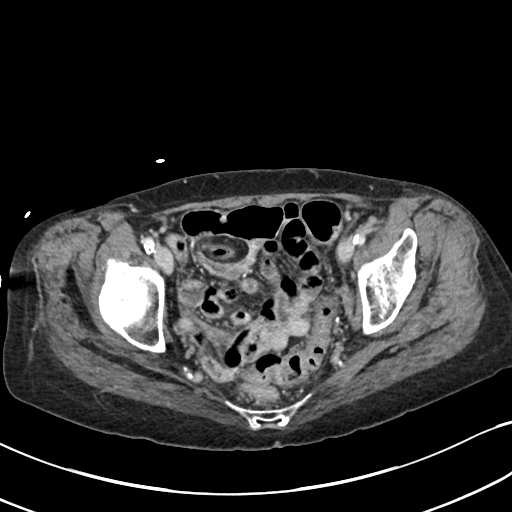
[im 28/78  soft-tissue]
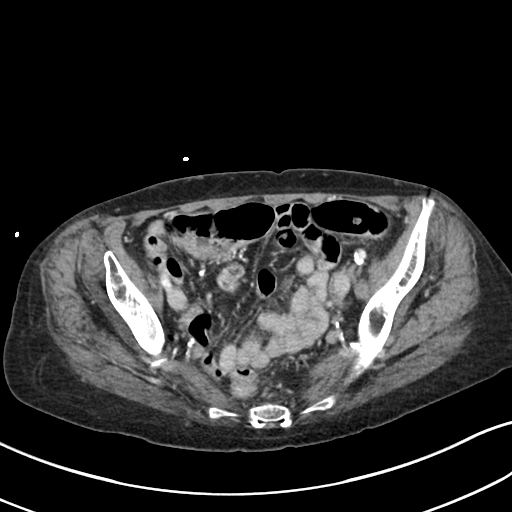
[im 32/78  soft-tissue]
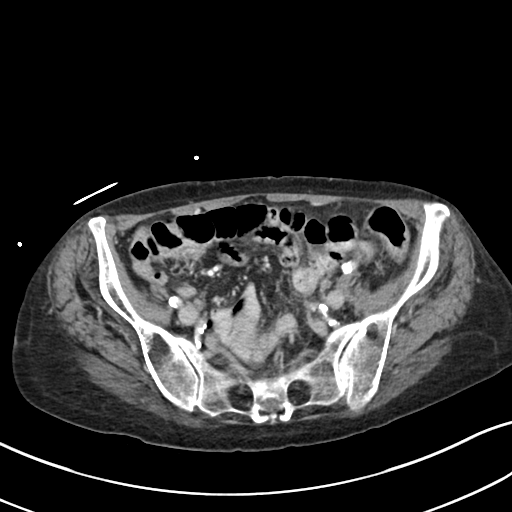
[im 41/78  soft-tissue]
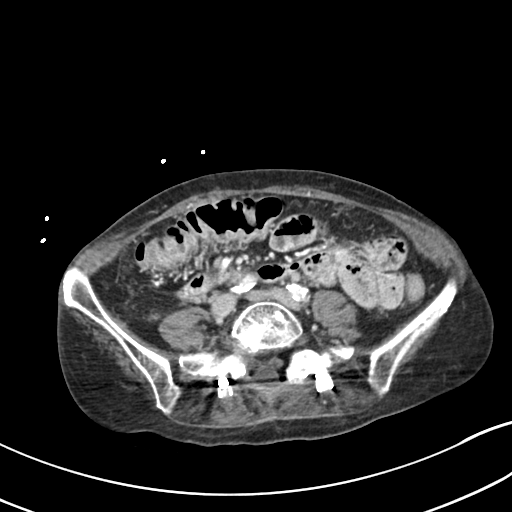
[im 46/78  soft-tissue]
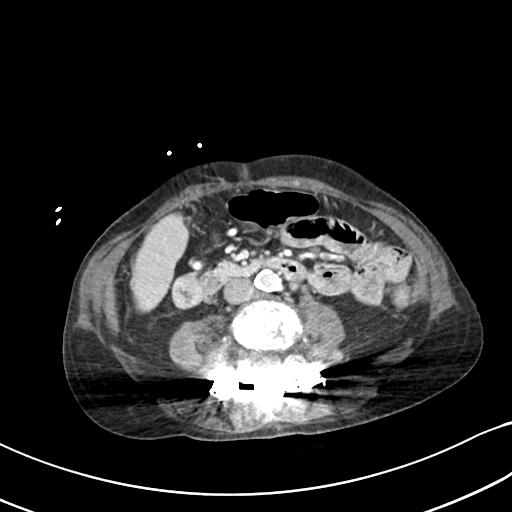
[im 50/78  soft-tissue]
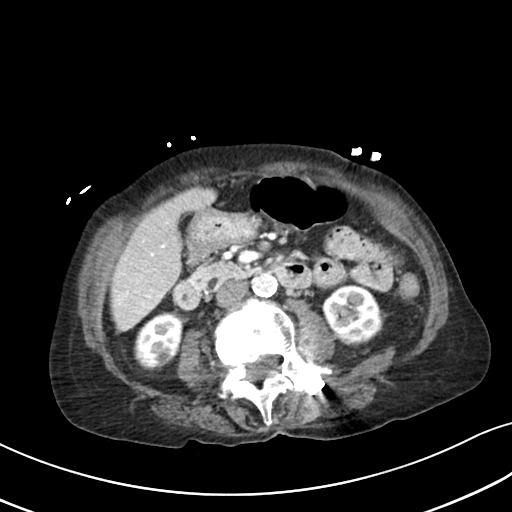
[im 50/78  bone]
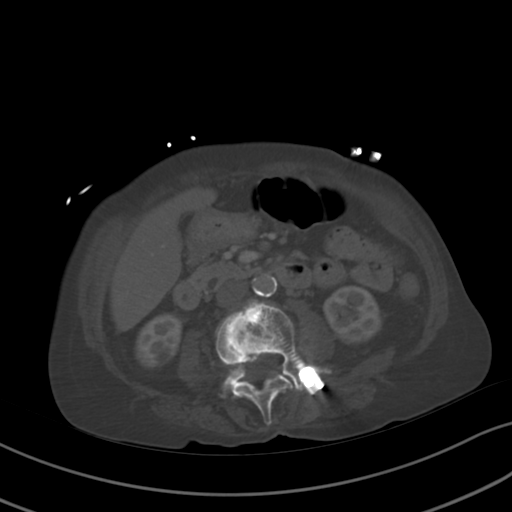
[im 55/78  soft-tissue]
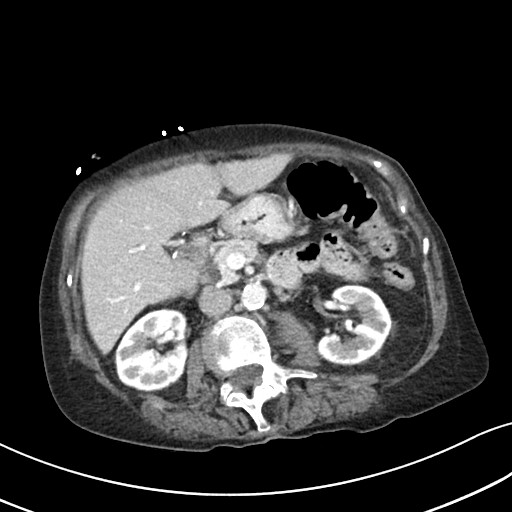
[im 59/78  soft-tissue]
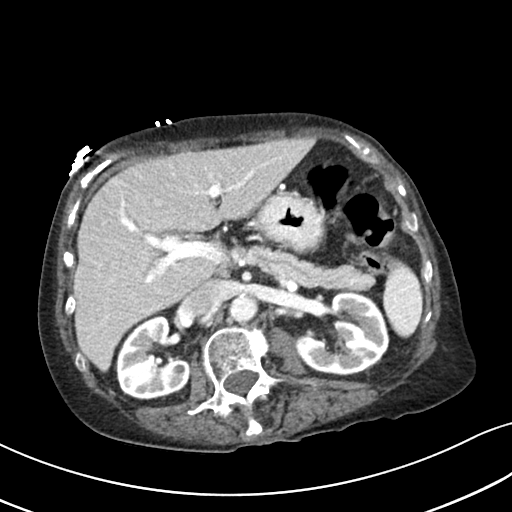
[im 68/78  soft-tissue]
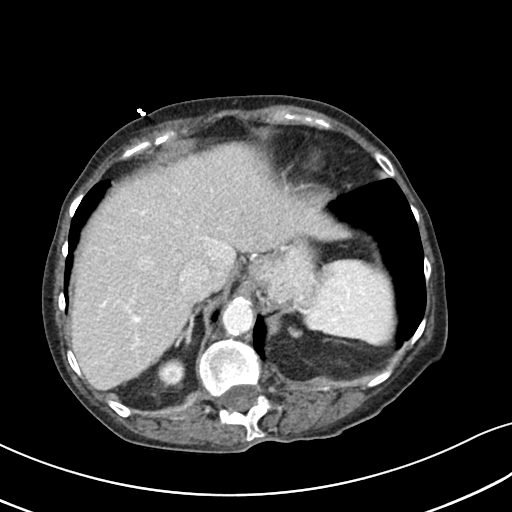
[im 73/78  soft-tissue]
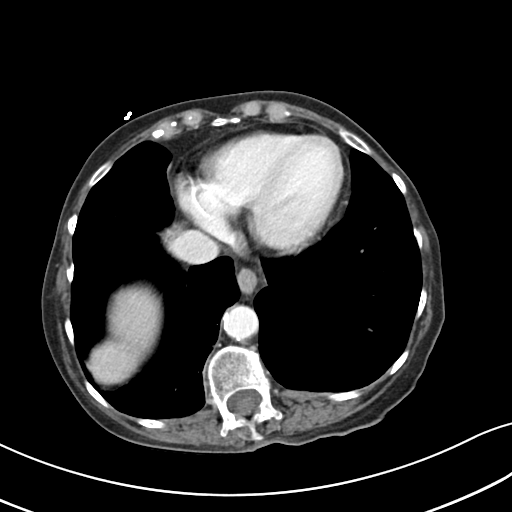

[Series 6: abdomen 3.0 mpr cor · coronal · 0.61mm/px · 3 of 82 slices shown]
[im 28/82  soft-tissue]
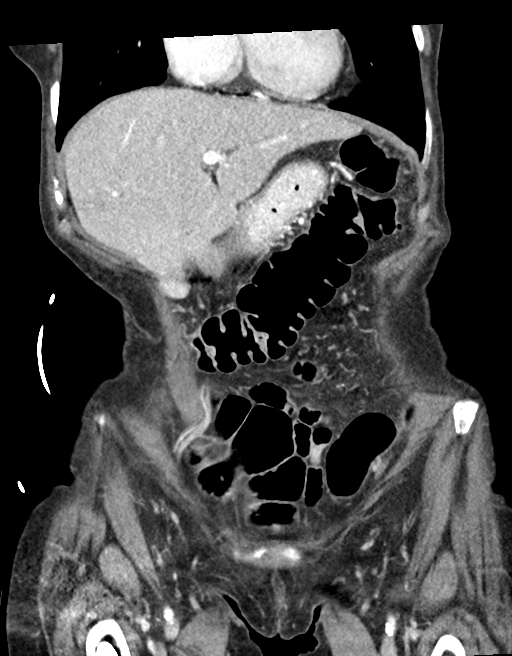
[im 37/82  soft-tissue]
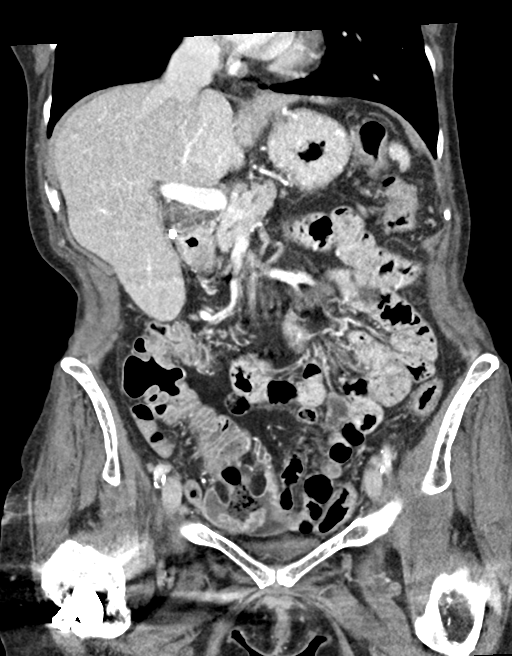
[im 46/82  soft-tissue]
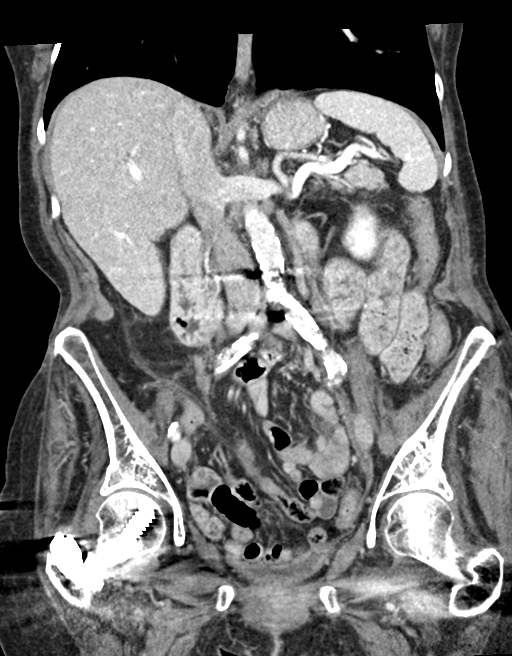

[16 of 46 positions shown; findings below may reference images not displayed]

FINDINGS: Lower chest: No acute pleural or parenchymal lung disease.

Hepatobiliary: No focal liver abnormality is seen. Status post
cholecystectomy. No biliary dilatation.

Pancreas: Unremarkable. No pancreatic ductal dilatation or
surrounding inflammatory changes. Pancreatic divisum again noted.

Spleen: Normal in size without focal abnormality.

Adrenals/Urinary Tract: Mild bilateral renal cortical atrophy. Small
renal cysts are again noted. The adrenals are unremarkable. No
urinary tract calculi or obstructive uropathy. Bladder is
decompressed limiting its evaluation.

Stomach/Bowel: No bowel obstruction or ileus. The appendix is
surgically absent. No bowel wall thickening or inflammatory change.

Vascular/Lymphatic: Aortic atherosclerosis. No enlarged abdominal or
pelvic lymph nodes.

Reproductive: Uterus and bilateral adnexa are unremarkable.

Other: No free fluid or free gas. No abdominal wall hernia.

Musculoskeletal: Stable postsurgical changes of the lumbar spine and
right femur. No acute or destructive bony lesions. Reconstructed
images demonstrate no additional findings.
IMPRESSION: 1. No acute intra-abdominal or intrapelvic process.
2.  Emphysema ([id number]).

## 2022-03-25 IMAGING — DX DG CHEST 1V PORT
1 series · 1 of 1 positions shown · non-contrast
Comparison: 12/30/2019

CLINICAL DATA: Unresponsive, altered mental status

EXAM:
PORTABLE CHEST 1 VIEW

[chest]
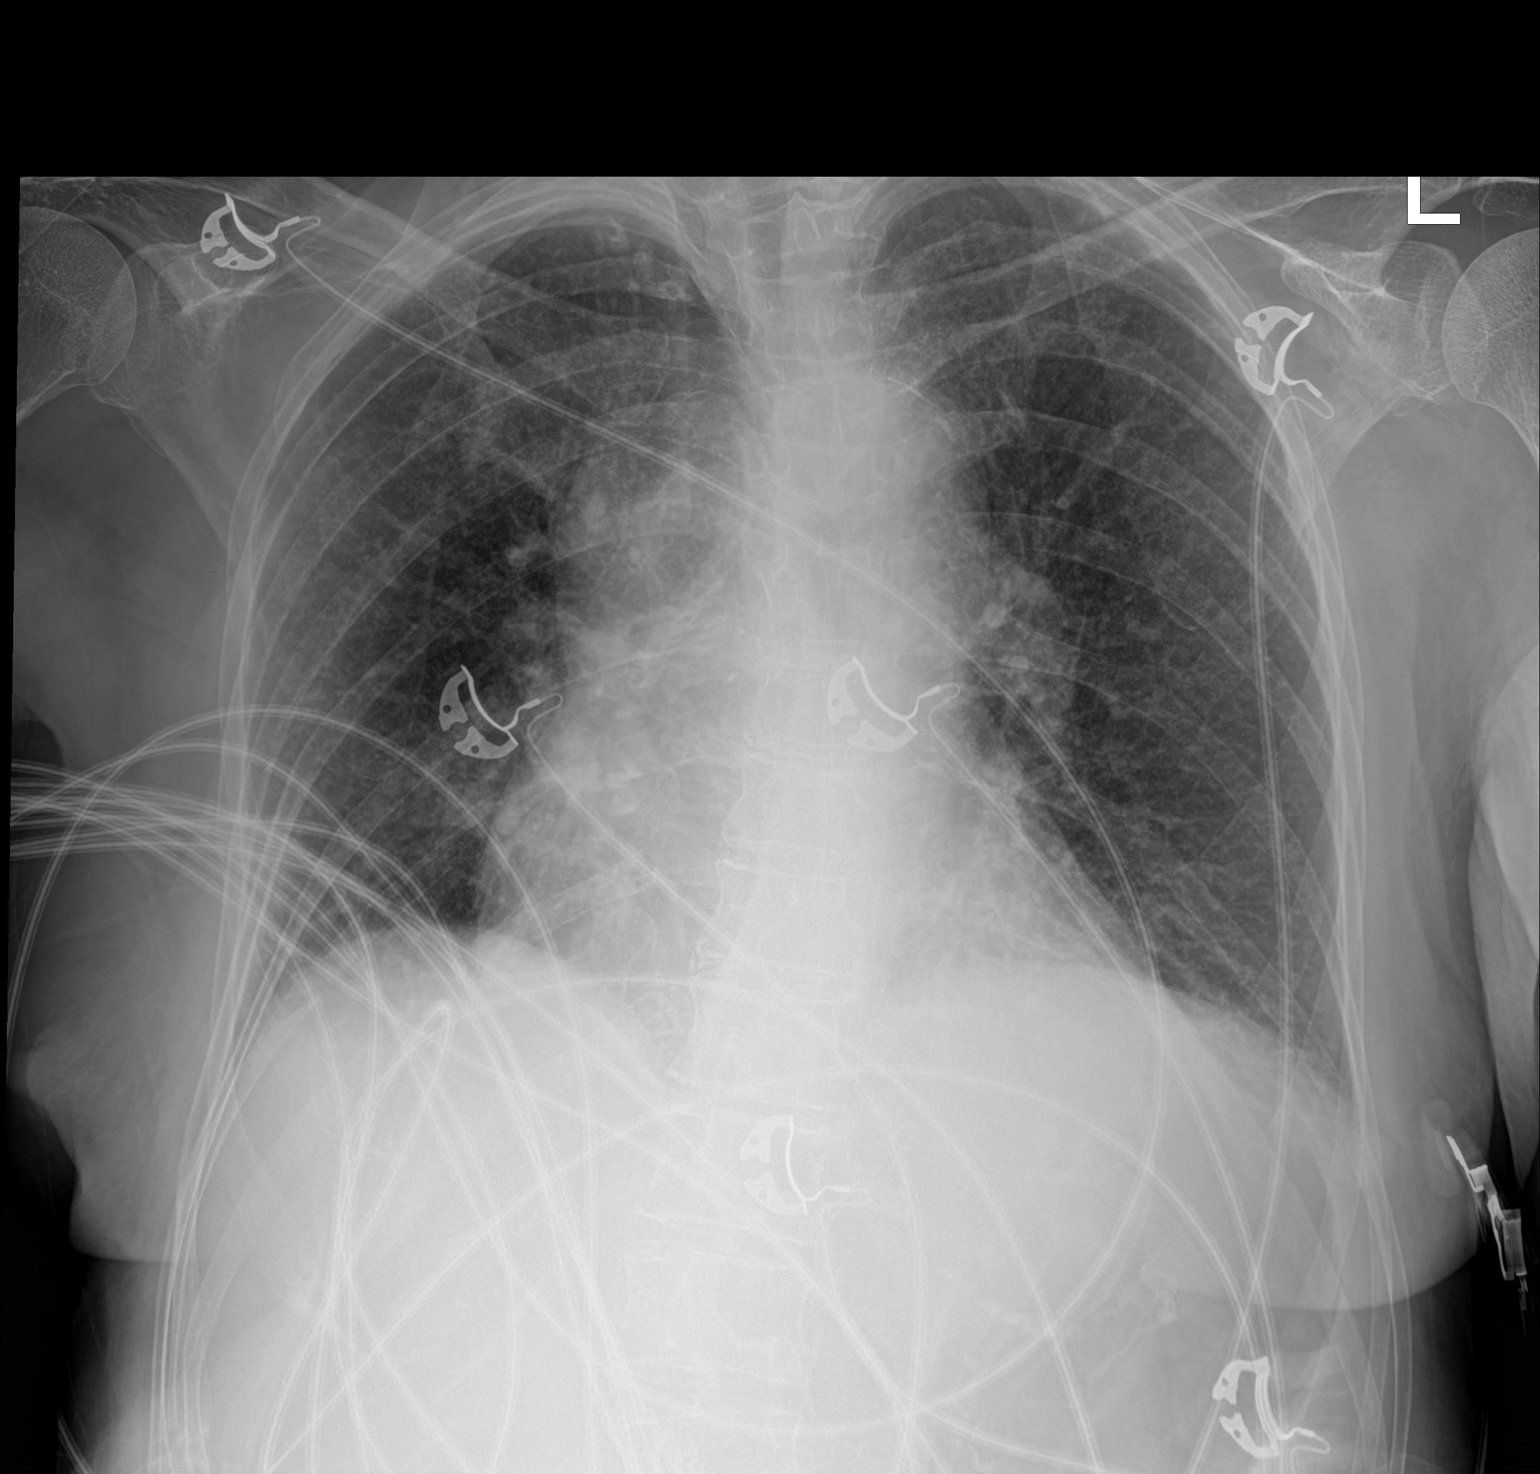

[1 of 1 positions shown; findings below may reference images not displayed]

FINDINGS: Rotated AP portable examination with mild cardiomegaly. Mild,
diffuse interstitial pulmonary opacity. No focal airspace opacity.
IMPRESSION: Rotated AP portable examination with mild cardiomegaly and mild,
diffuse interstitial pulmonary opacity, likely mild edema. No focal
airspace opacity.

## 2022-03-25 IMAGING — CT CT HEAD W/O CM
4 series · 17 of 47 positions shown, 19 images · non-contrast
Comparison: 09/10/2019

CLINICAL DATA: Altered mental status

EXAM:
CT HEAD WITHOUT CONTRAST
TECHNIQUE: Contiguous axial images were obtained from the base of the skull
through the vertex without intravenous contrast.

[Series 3: head without · axial · non-contrast · 0.44mm/px · z∈[-114,+11]mm · 7 of 35 slices shown, 9 images]
[im 5/35  brain]
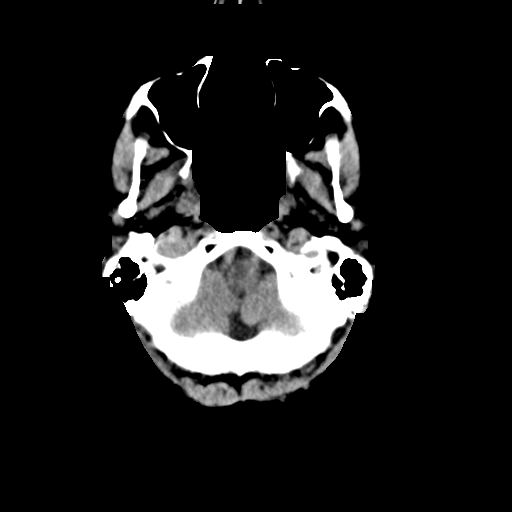
[im 5/35  bone]
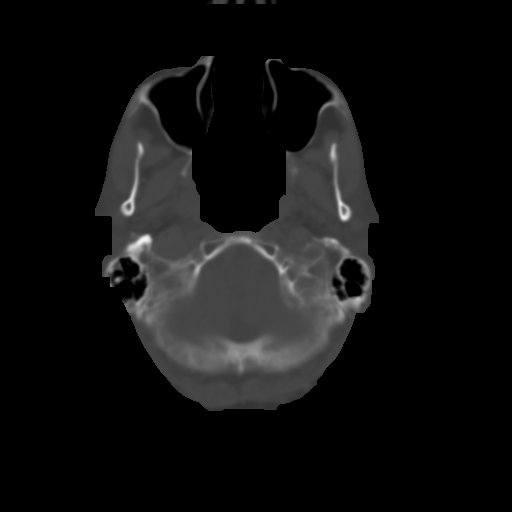
[im 9/35  brain]
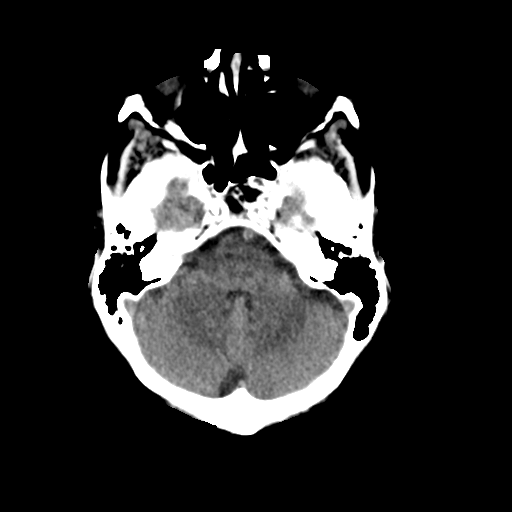
[im 13/35  brain]
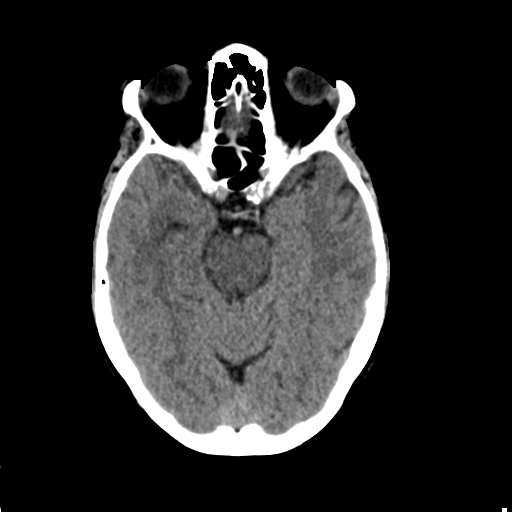
[im 18/35  brain]
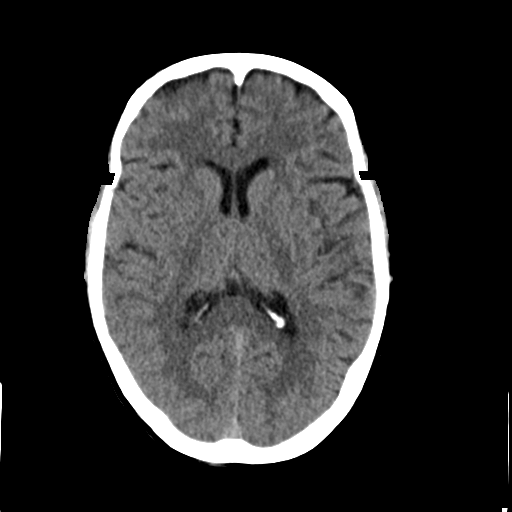
[im 22/35  brain]
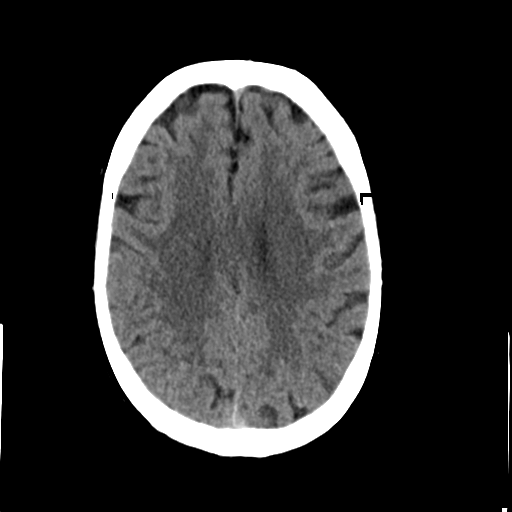
[im 22/35  bone]
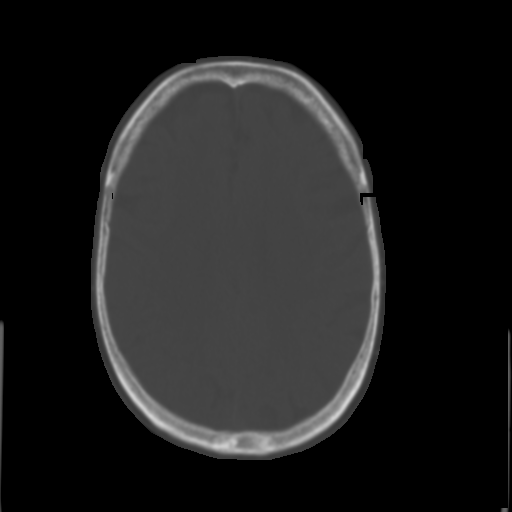
[im 26/35  brain]
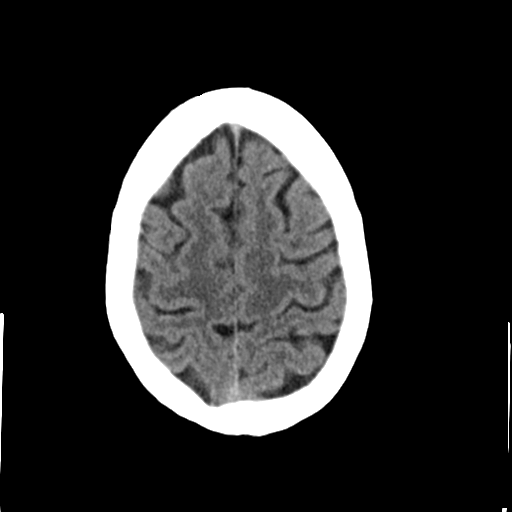
[im 30/35  brain]
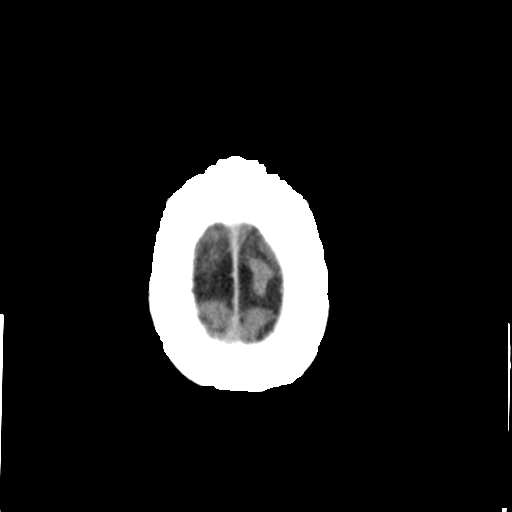

[Series 4: head bone · axial · 0.44mm/px · z∈[-118,-58]mm · 4 of 87 slices shown]
[im 9/87  bone]
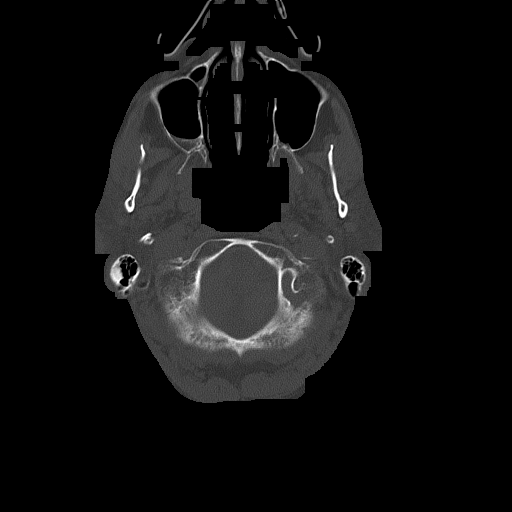
[im 18/87  bone]
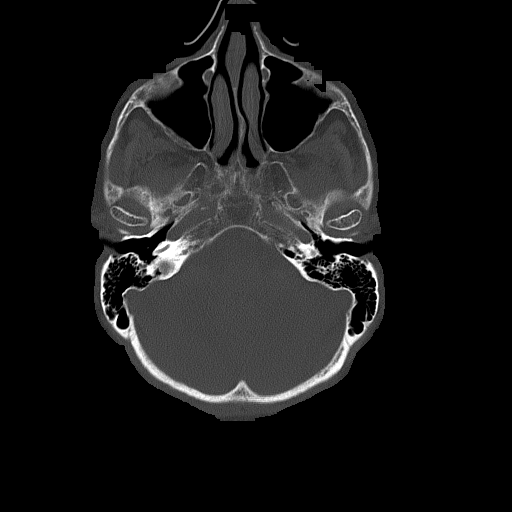
[im 26/87  bone]
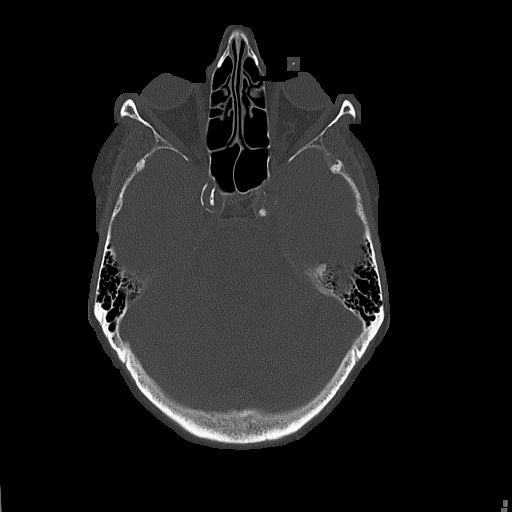
[im 39/87  bone]
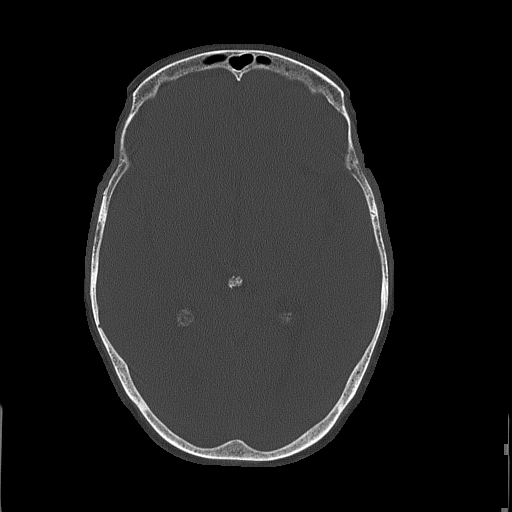

[Series 5: head without cor · coronal · non-contrast · 0.33mm/px · 3 of 71 slices shown]
[im 24/71  brain]
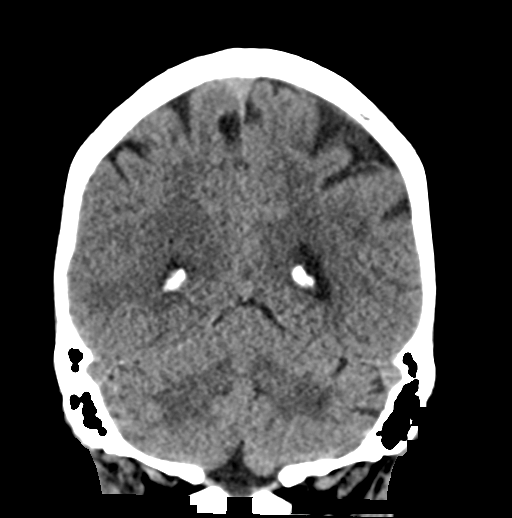
[im 32/71  brain]
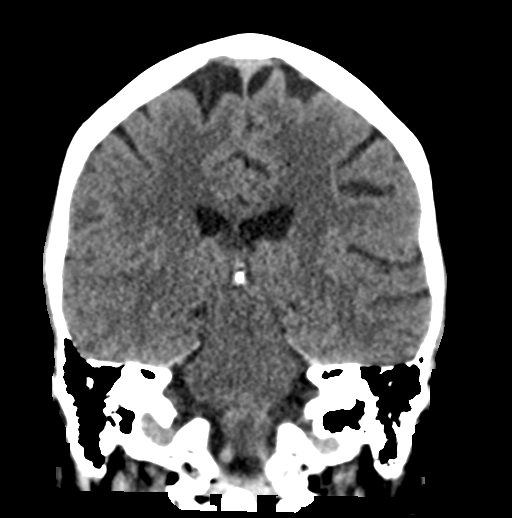
[im 39/71  brain]
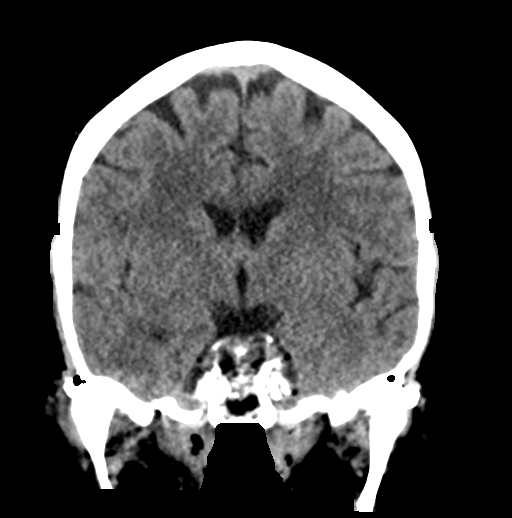

[Series 6: head without sag · sagittal · non-contrast · 0.34mm/px · 3 of 60 slices shown]
[im 20/60  brain]
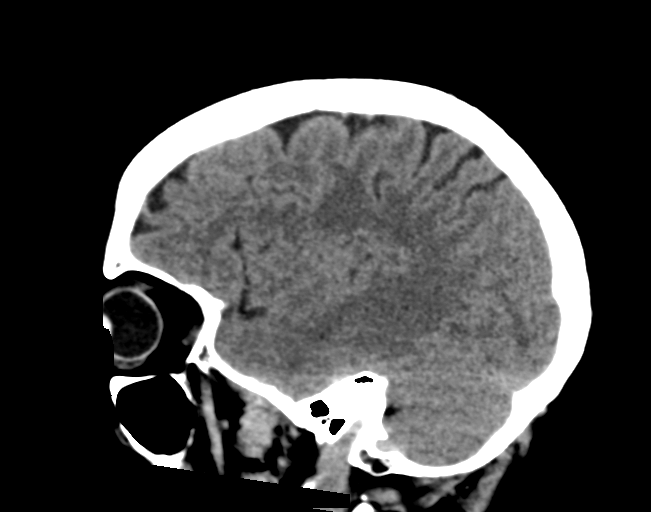
[im 30/60  brain]
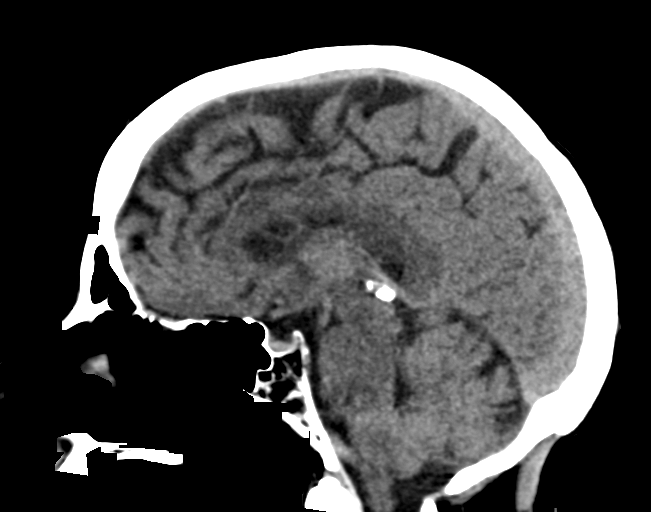
[im 40/60  brain]
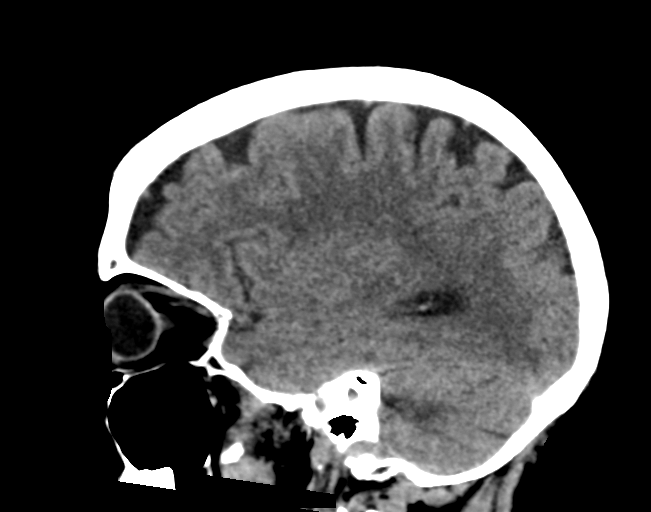

[17 of 47 positions shown; findings below may reference images not displayed]

FINDINGS: Brain: No evidence of acute infarction, hemorrhage, hydrocephalus,
extra-axial collection or mass lesion/mass effect. Scattered
low-density changes within the periventricular and subcortical white
matter compatible with chronic microvascular ischemic change. Mild
diffuse cerebral volume loss.

Vascular: Atherosclerotic calcifications involving the large vessels
of the skull base. No unexpected hyperdense vessel.

Skull: Normal. Negative for fracture or focal lesion.

Sinuses/Orbits: No acute finding.

Other: None.
IMPRESSION: 1. No acute intracranial findings.
2. Chronic microvascular ischemic change and cerebral volume loss.

## 2022-03-27 IMAGING — DX DG CHEST 1V PORT
1 series · 1 of 1 positions shown · non-contrast
Comparison: July 04, 2020

CLINICAL DATA: Shortness of breath

EXAM:
PORTABLE CHEST 1 VIEW

[chest ap]
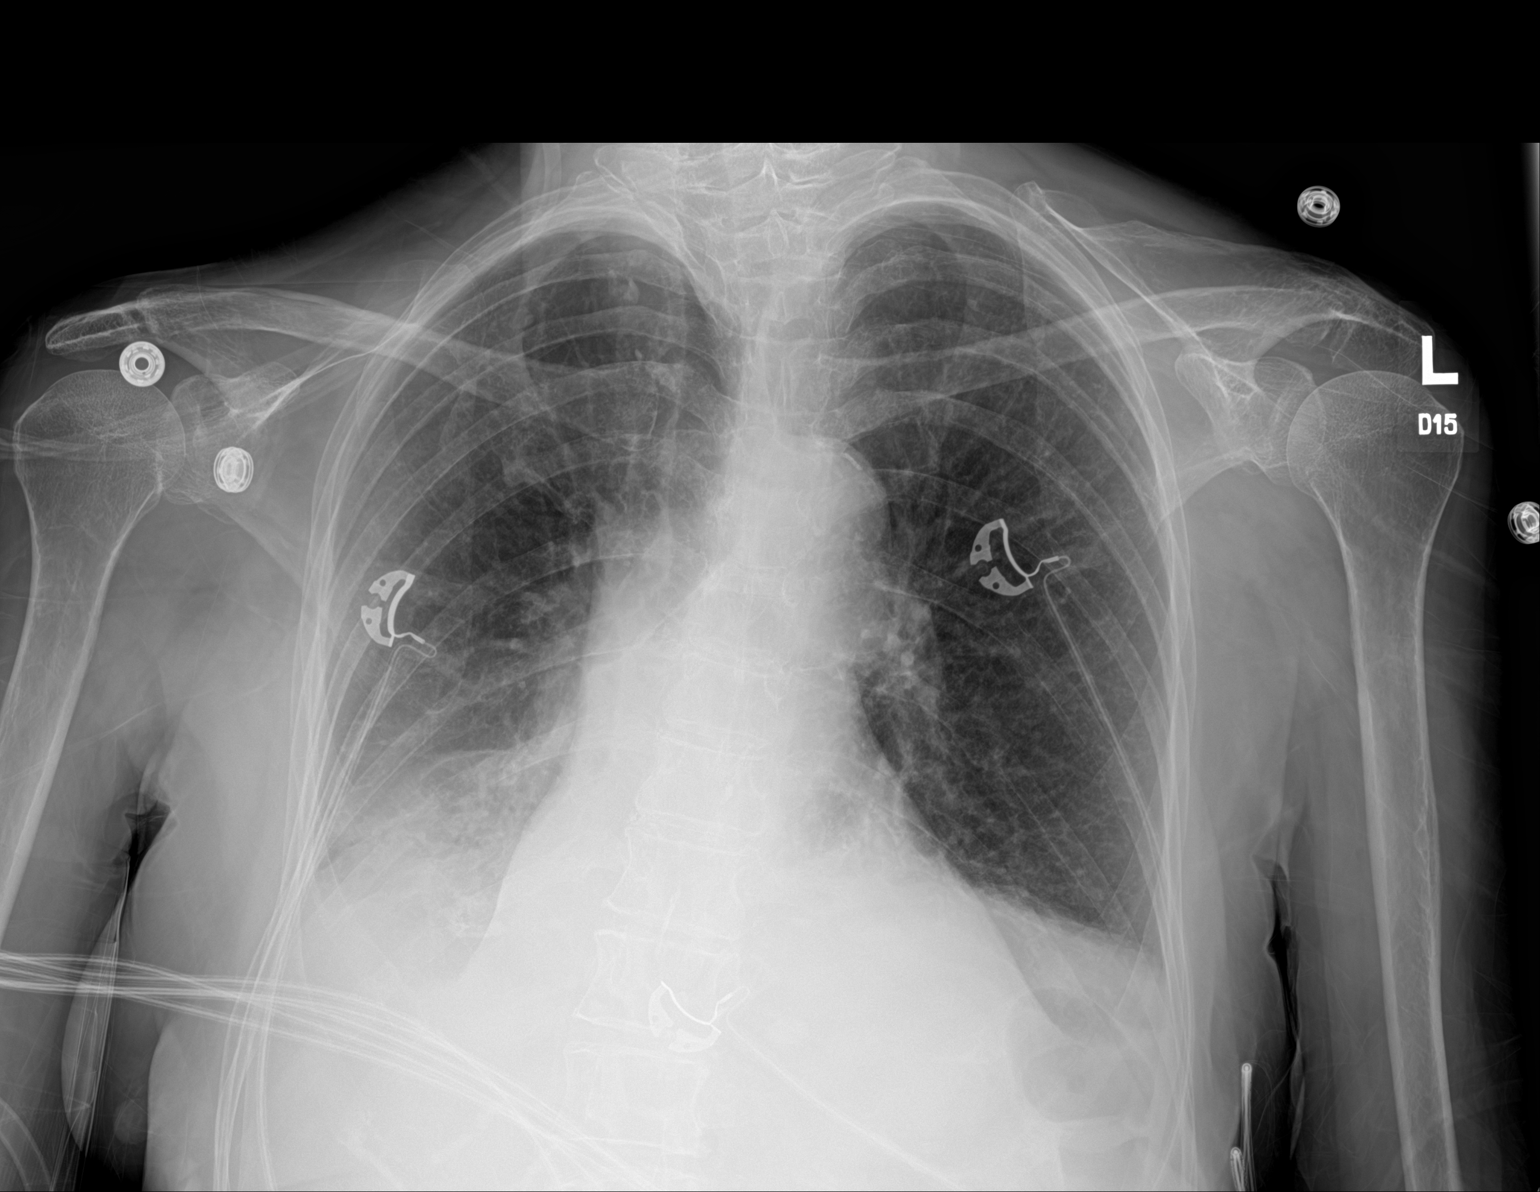

[1 of 1 positions shown; findings below may reference images not displayed]

FINDINGS: There are fairly small pleural effusions bilaterally with bibasilar
atelectatic change. Lungs elsewhere are clear. Heart is mildly
enlarged with pulmonary vascularity normal. No adenopathy. There is
aortic atherosclerosis. Bones are osteoporotic.
IMPRESSION: Fairly small pleural effusions bilaterally with bibasilar
atelectasis. Lungs elsewhere clear. Stable cardiac prominence.

Aortic Atherosclerosis ([id number]).

## 2022-03-29 IMAGING — DX DG CHEST 1V PORT
1 series · 1 of 1 positions shown · non-contrast
Comparison: Radiograph 07/06/2020. chest CT 12/23/2019

CLINICAL DATA: CHF.

EXAM:
PORTABLE CHEST 1 VIEW

[chest ap]
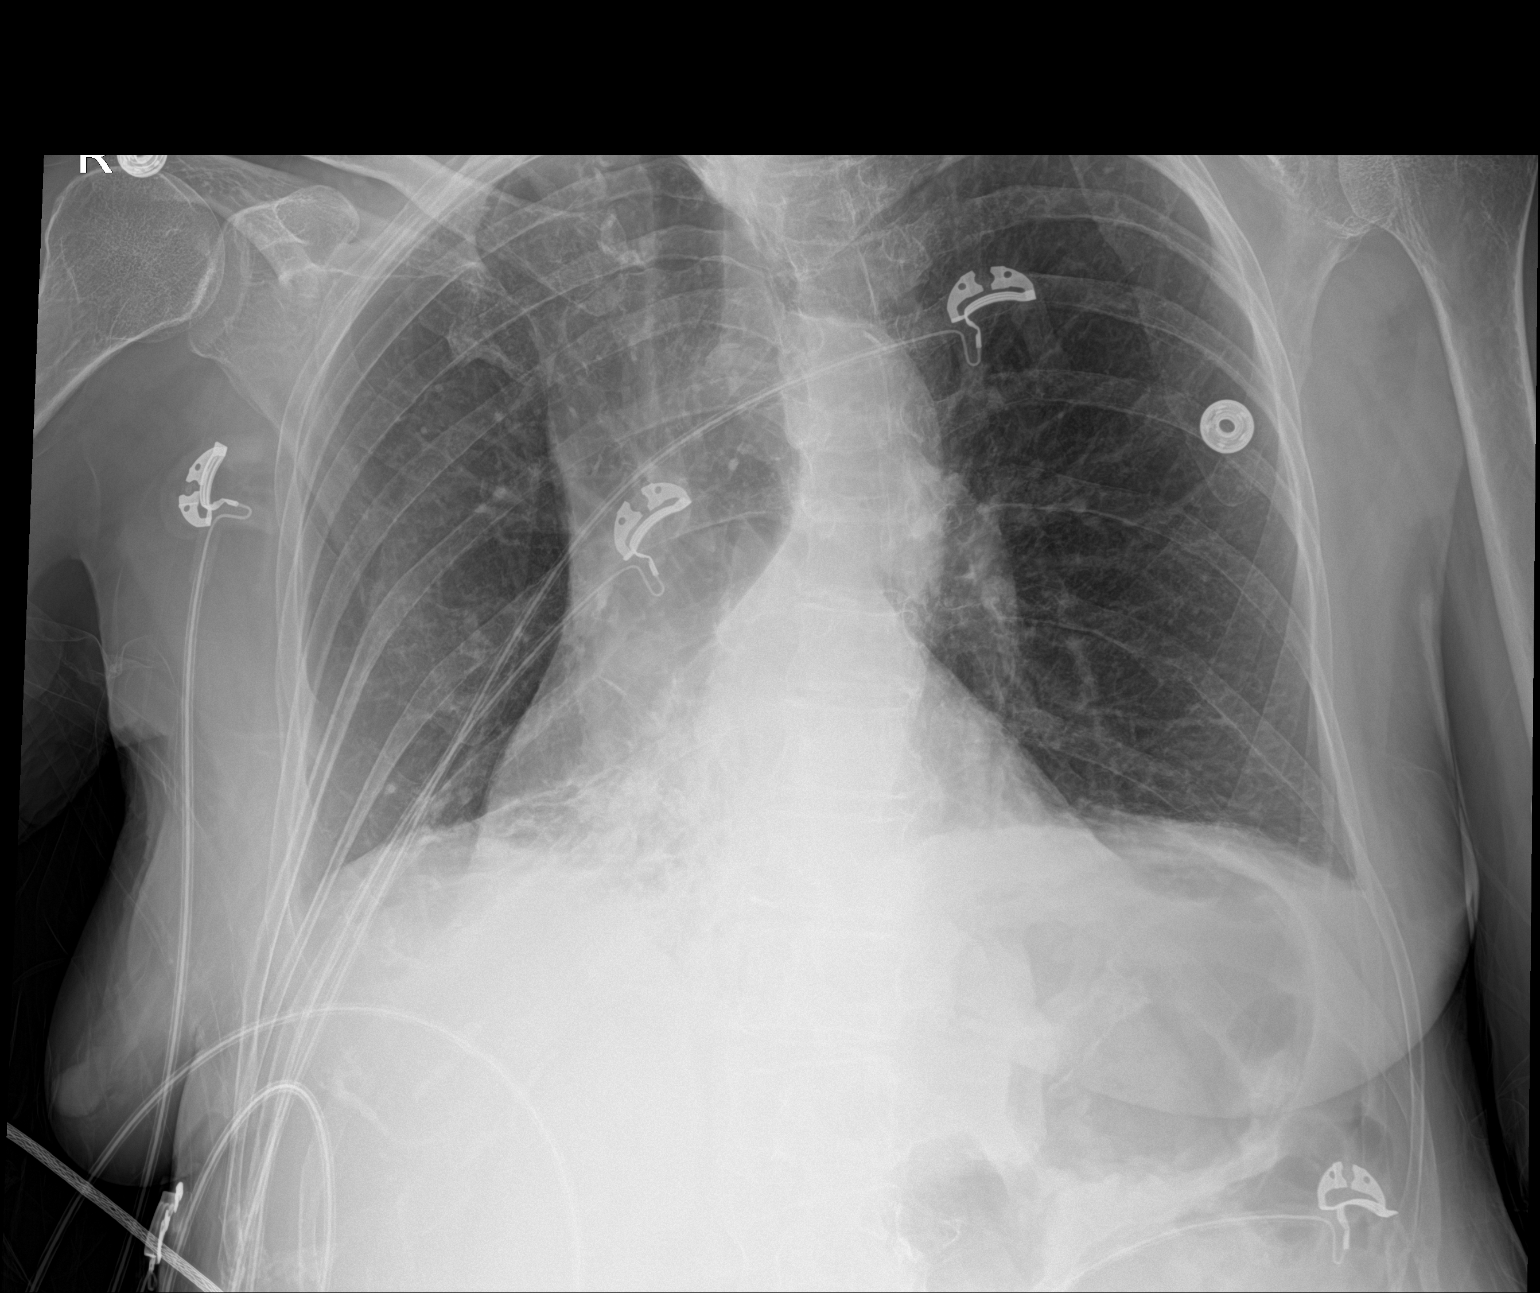

[1 of 1 positions shown; findings below may reference images not displayed]

FINDINGS: Patient is rotated limiting assessment. Cardiomegaly is grossly
stable. Mediastinal contours grossly stable. Improving interstitial
opacities likely resolving pulmonary edema. Persistent ill-defined
opacity in the right infrahilar region. Streaky left lung base
opacities. No pneumothorax. Stable osseous structures.
IMPRESSION: 1. Improving interstitial opacities likely resolving pulmonary
edema.
2. Persistent ill-defined opacity in the right infrahilar region,
may represent atelectasis or pneumonia. Streaky left lung base
opacities, favor atelectasis.

## 2022-04-21 IMAGING — DX DG STERNUM 2+V
2 series · 2 of 2 positions shown · non-contrast
Comparison: July 08, 2020

CLINICAL DATA: Status post CPR.

EXAM:
STERNUM - 2+ VIEW

[chest pa]
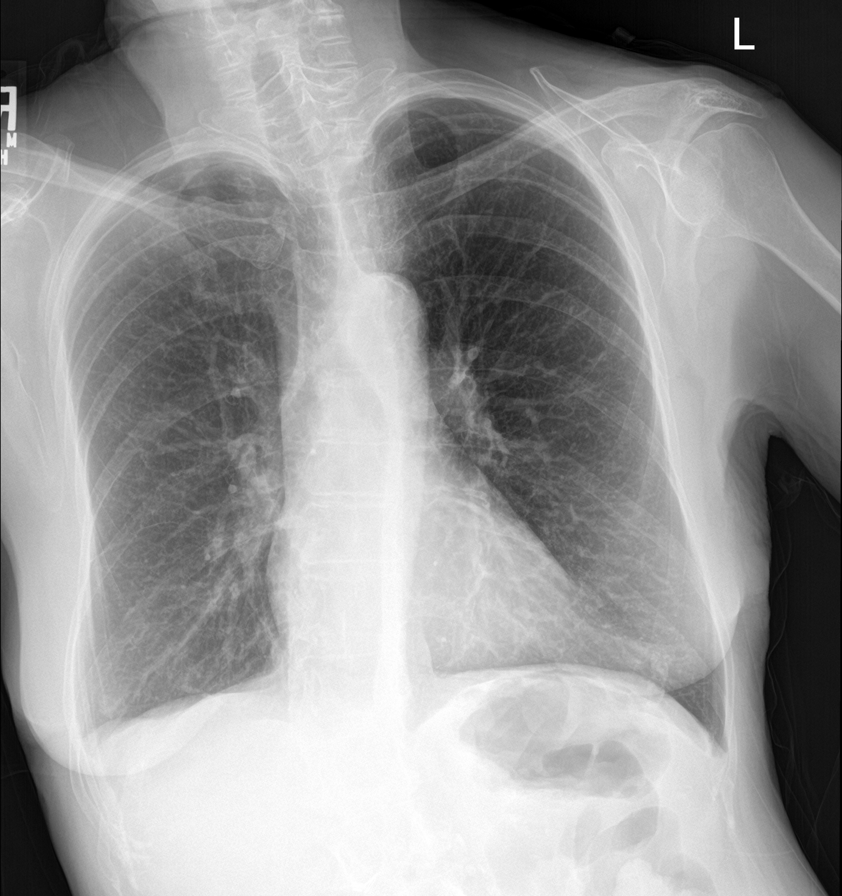

[sternum obl]
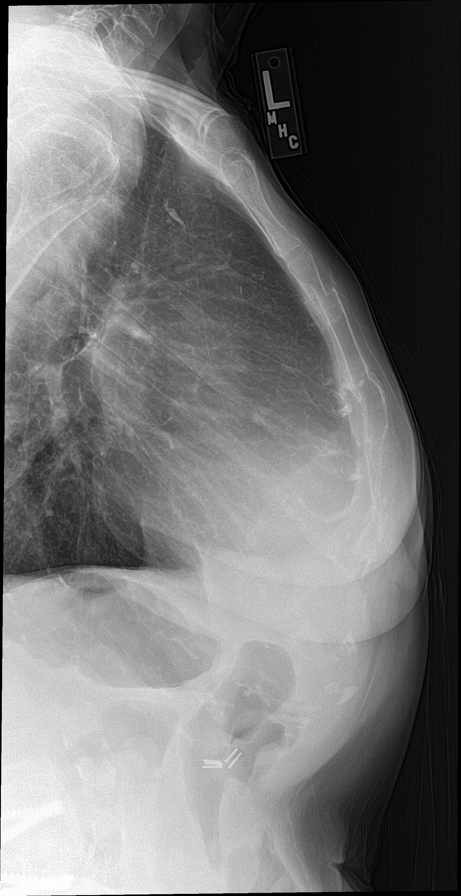

[2 of 2 positions shown; findings below may reference images not displayed]

FINDINGS: An acute fracture deformity is seen involving the mid to upper
portion of the body of the sternum. Approximately 5 mm anterior
displacement of the inferior aspect of the body of the sternum is
seen. An additional fracture deformity of indeterminate age is noted
involving the lower portion of the body of the sternum. Radiopaque
surgical clips are seen within the abdomen on the lateral view.
Bilateral radiopaque pedicle screws are seen within the visualized
portion of the lumbar spine.
IMPRESSION: 1. Acute fracture of the mid to upper body of the sternum.
2. Age-indeterminate fracture of the lower portion of the body of
the sternum.

## 2022-04-30 IMAGING — CT CT CHEST W/O CM
2 of 5 series · 15 of 36 positions shown, 18 images · non-contrast
Comparison: 12/23/2019.

CLINICAL DATA: Sternal fracture due to cardiopulmonary
resuscitation on 07/04/2020.

EXAM:
CT CHEST WITHOUT CONTRAST
TECHNIQUE: Multidetector CT imaging of the chest was performed following the
standard protocol without IV contrast.

[Series 2: thorax · axial · 0.57mm/px · z∈[-276,-34]mm · 12 of 145 slices shown, 15 images]
[im 12/145  mediastinal]
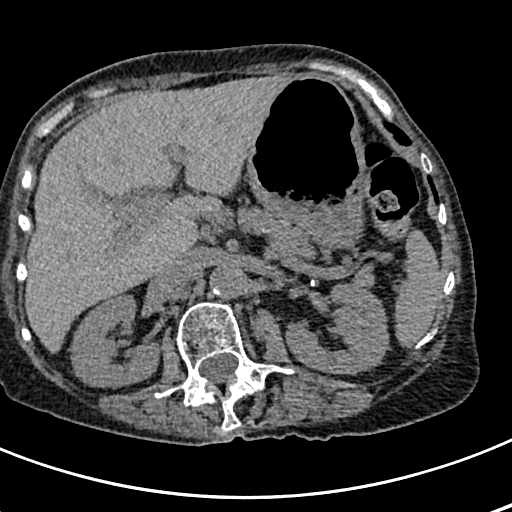
[im 12/145  lung]
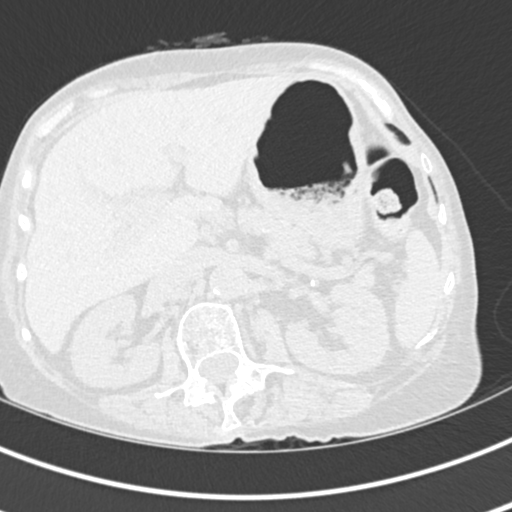
[im 23/145  lung]
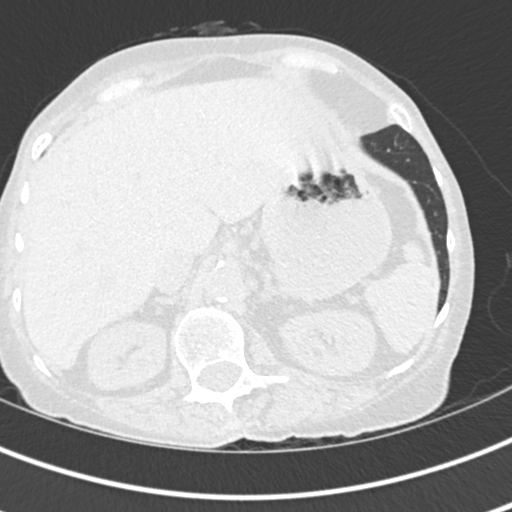
[im 34/145  lung]
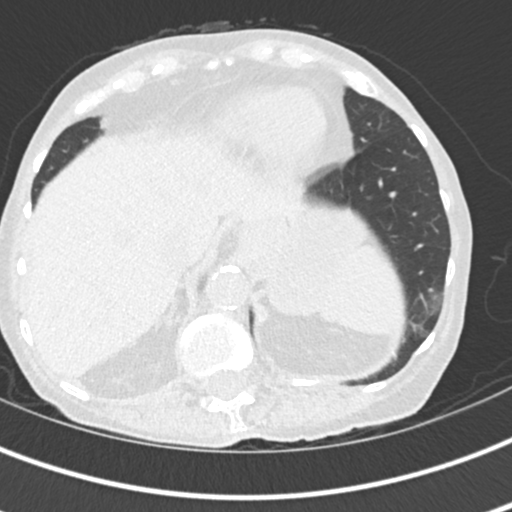
[im 45/145  lung]
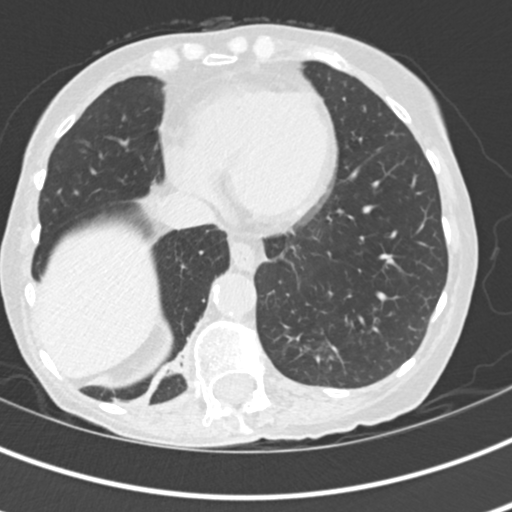
[im 56/145  mediastinal]
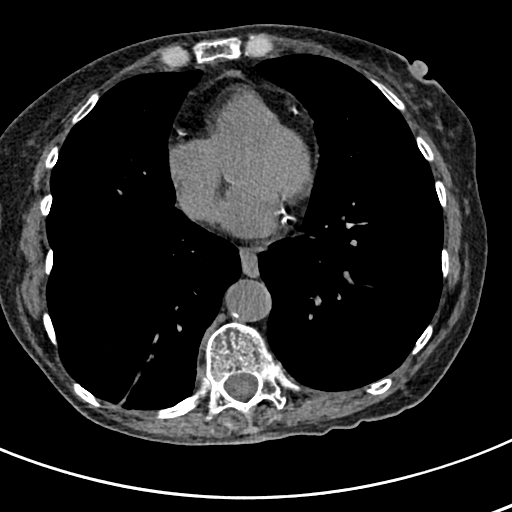
[im 56/145  lung]
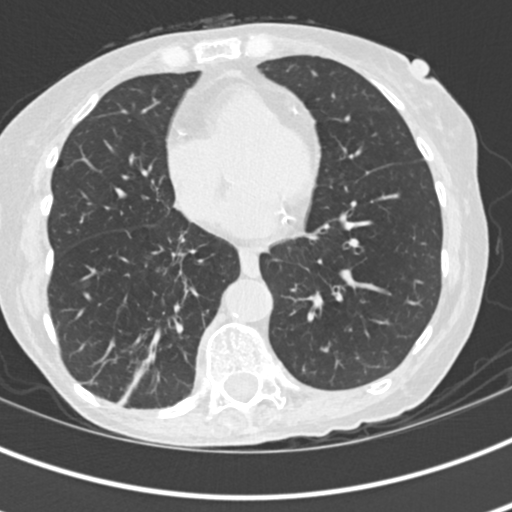
[im 67/145  lung]
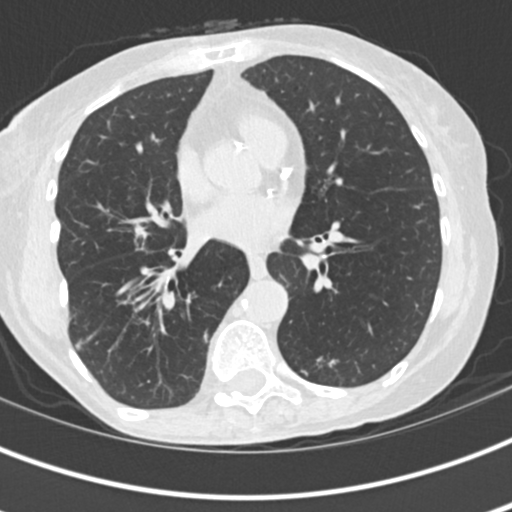
[im 78/145  lung]
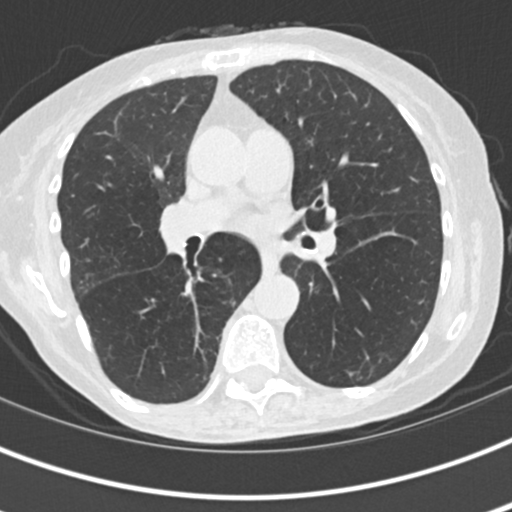
[im 89/145  lung]
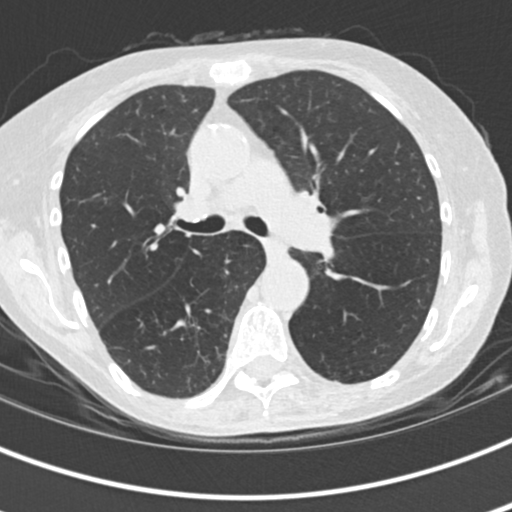
[im 100/145  mediastinal]
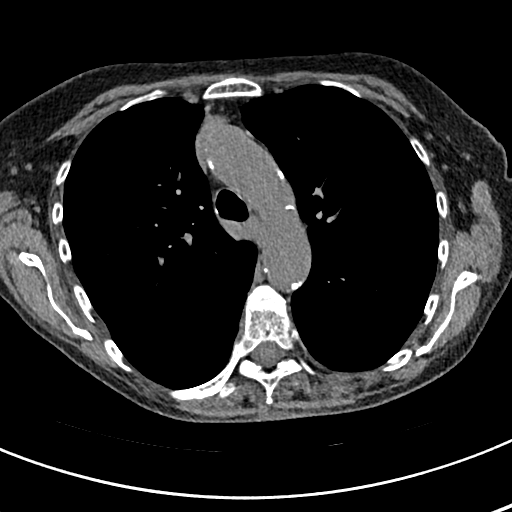
[im 100/145  lung]
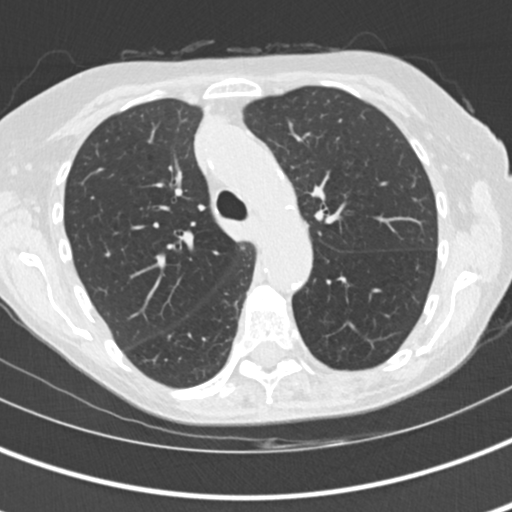
[im 111/145  lung]
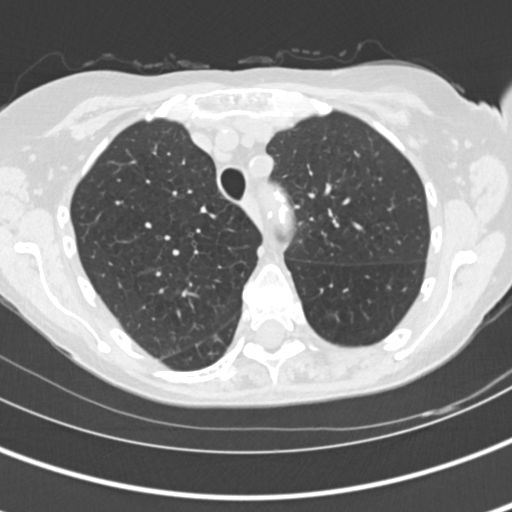
[im 122/145  lung]
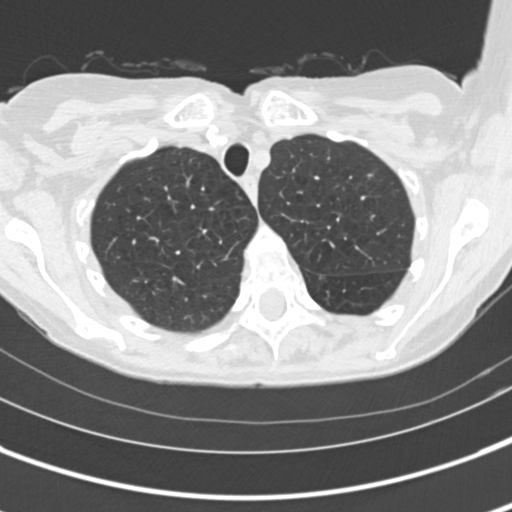
[im 133/145  lung]
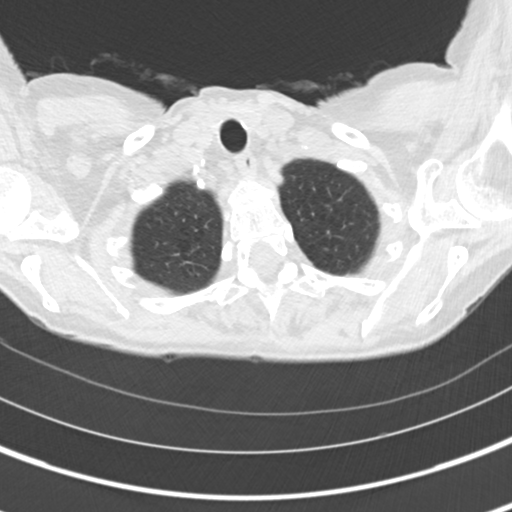

[Series 5: coronal · coronal · 0.59mm/px · 3 of 121 slices shown]
[im 25/121  lung]
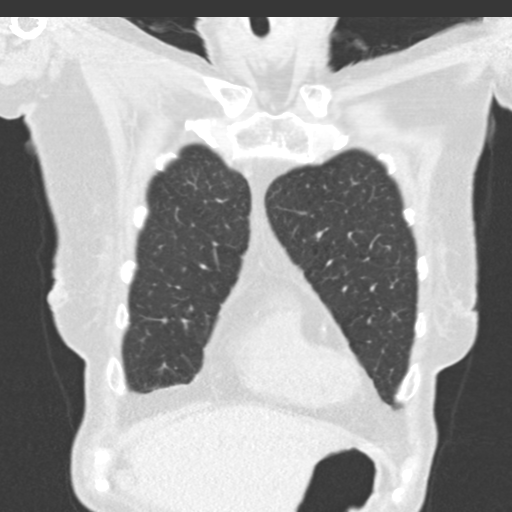
[im 49/121  lung]
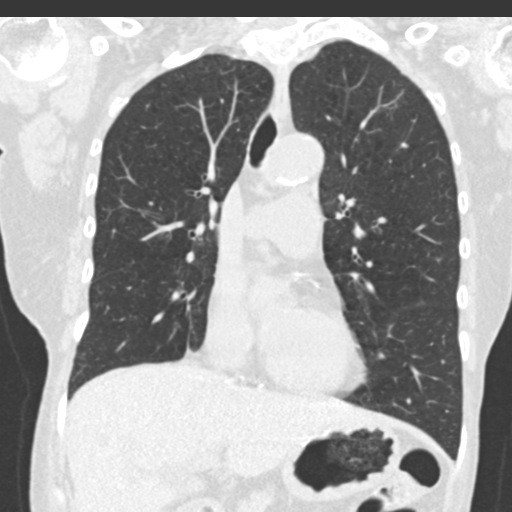
[im 73/121  lung]
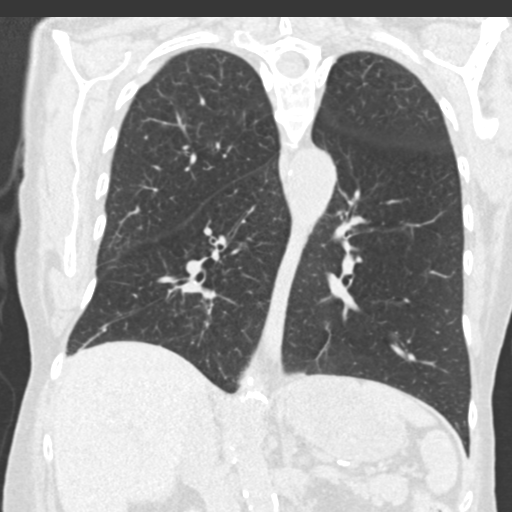

[15 of 36 positions shown; findings below may reference images not displayed]

FINDINGS: Cardiovascular: Atherosclerotic calcification of the aorta and
coronary arteries. Heart size normal. No pericardial effusion.

Mediastinum/Nodes: Mediastinal lymph nodes are not enlarged by CT
size criteria. Hilar regions are difficult to definitively evaluate
without IV contrast. No axillary adenopathy. Esophagus is grossly
unremarkable.

Lungs/Pleura: Centrilobular emphysema. Mild cylindrical
bronchiectasis. Volume loss in the posteromedial right lower lobe. 3
mm right lower lobe nodule (3/71), not well seen on 12/23/2019.
Probable nodular scarring in the perihilar right lower lobe,
measuring 0.4 x 1.1 cm (3/72), also new. Peribronchovascular
nodularity in the posterior left lower lobe, likely postinfectious
in etiology. No pleural fluid. Adherent debris in the trachea.

Upper Abdomen: Subcentimeter low-attenuation lesion in the left
hepatic lobe is too small to characterize. Cholecystectomy. Adrenal
glands are unremarkable. A stone is seen in the right kidney. Renal
vascular calcifications on the left. Visualized portions of the
spleen, pancreas, stomach and bowel are otherwise unremarkable.

Musculoskeletal: There is a healing fracture of the sternum, near
the junction of the upper and middle thirds, with 3 mm ventral
displacement of the distal fracture fragment. Degenerative changes
in the spine. No worrisome lytic or sclerotic lesions.
IMPRESSION: 1. Healing sternal fracture.
2. Likely postinfectious/postinflammatory scarring in the lower
lobes. Given the slight nodular appearance in the central right
lower lobe, follow-up CT chest without contrast in 3 months is
recommended, as clinically indicated.
3. Right renal stone.
4. Aortic atherosclerosis ([id number]-W92.2). Coronary artery
calcification.
5.  Emphysema ([id number].I).

## 2022-07-21 IMAGING — CT CT CHEST W/O CM
2 of 4 series · 15 of 36 positions shown, 18 images · non-contrast
Comparison: 08/09/2020 chest CT.

CLINICAL DATA: Follow-up nodular suspected scarring in right lower
lobe.

EXAM:
CT CHEST WITHOUT CONTRAST
TECHNIQUE: Multidetector CT imaging of the chest was performed following the
standard protocol without IV contrast.

[Series 2: thorax · axial · 0.60mm/px · z∈[-262,-42]mm · 12 of 131 slices shown, 15 images]
[im 11/131  mediastinal]
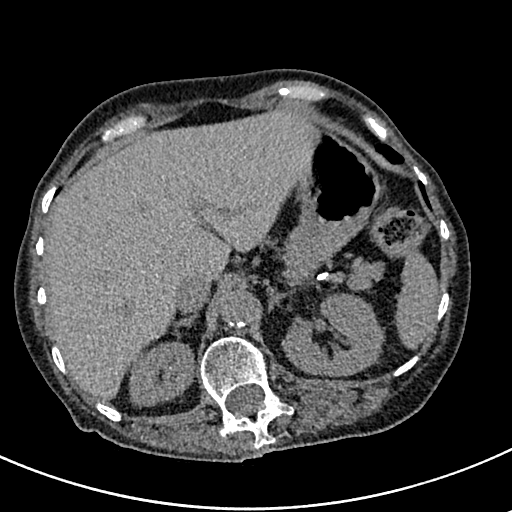
[im 11/131  lung]
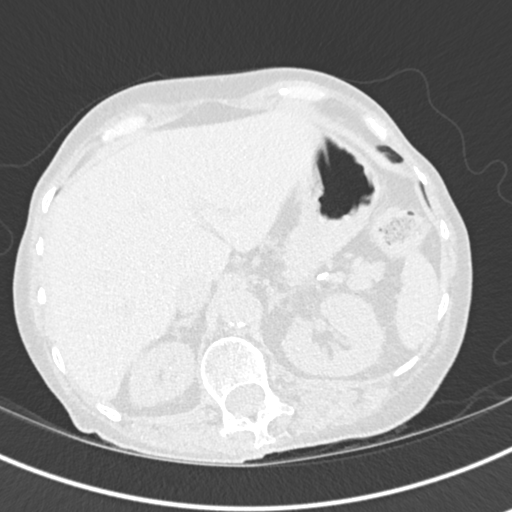
[im 21/131  lung]
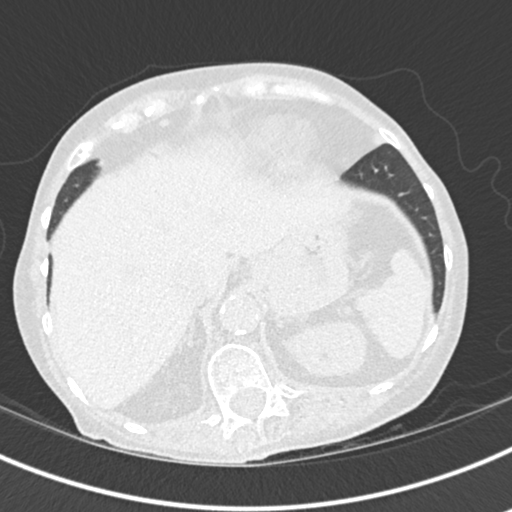
[im 31/131  lung]
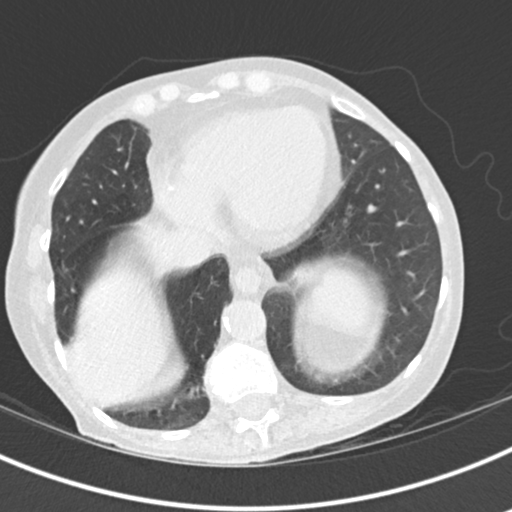
[im 41/131  lung]
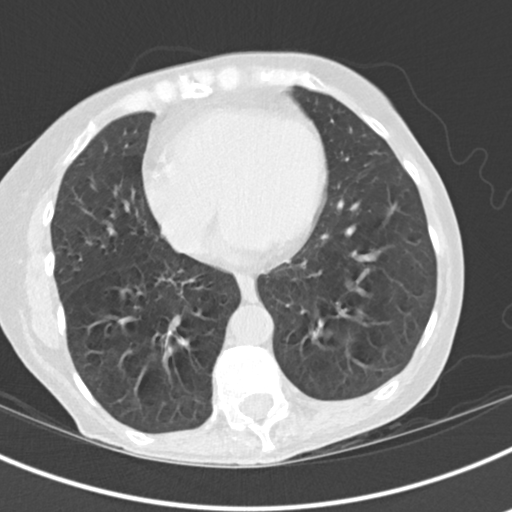
[im 51/131  mediastinal]
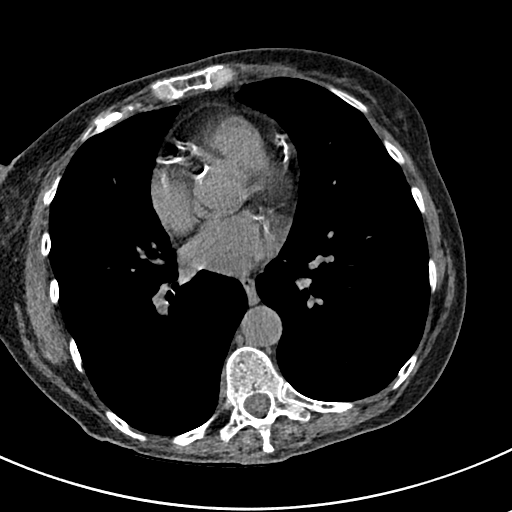
[im 51/131  lung]
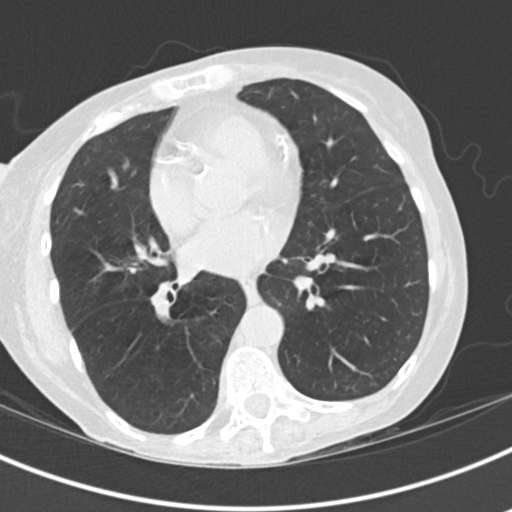
[im 61/131  lung]
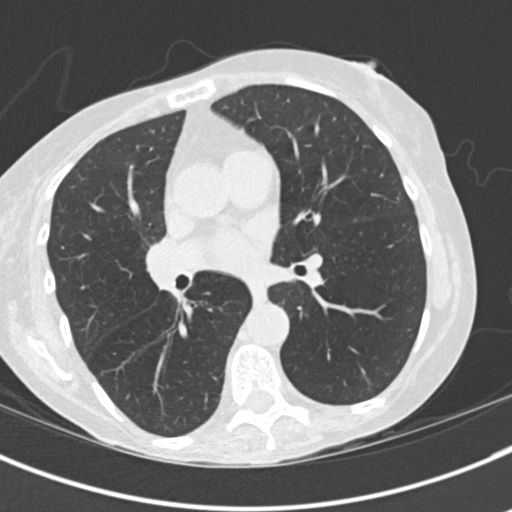
[im 71/131  lung]
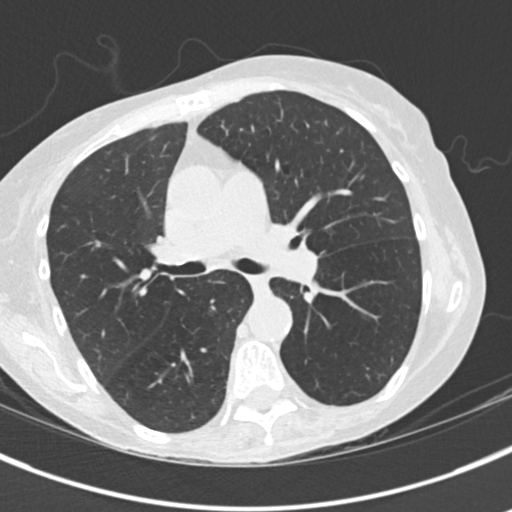
[im 81/131  lung]
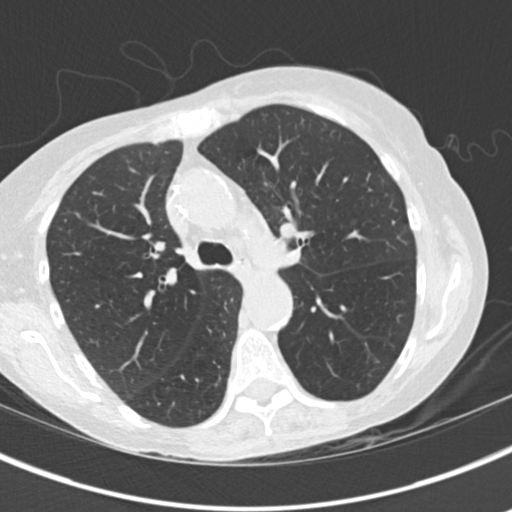
[im 91/131  mediastinal]
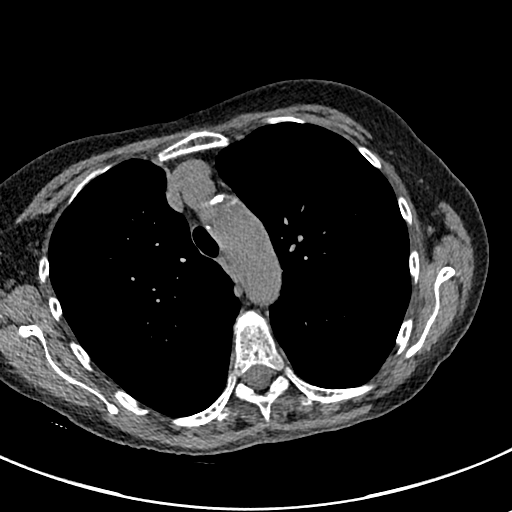
[im 91/131  lung]
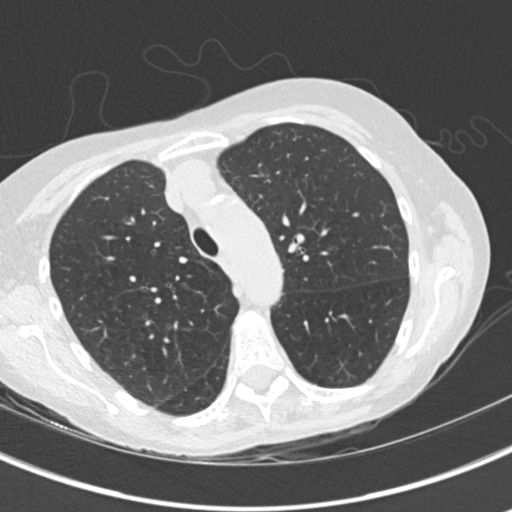
[im 101/131  lung]
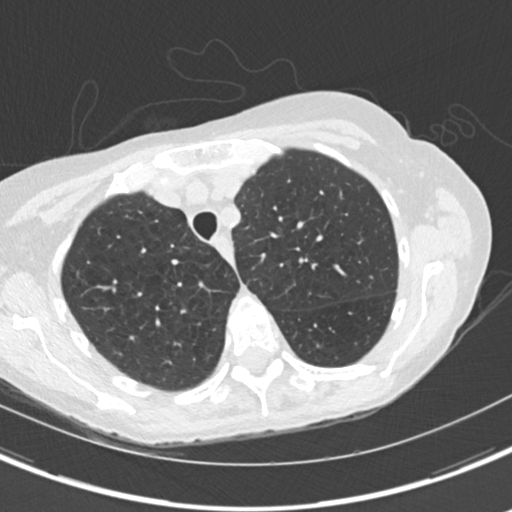
[im 111/131  lung]
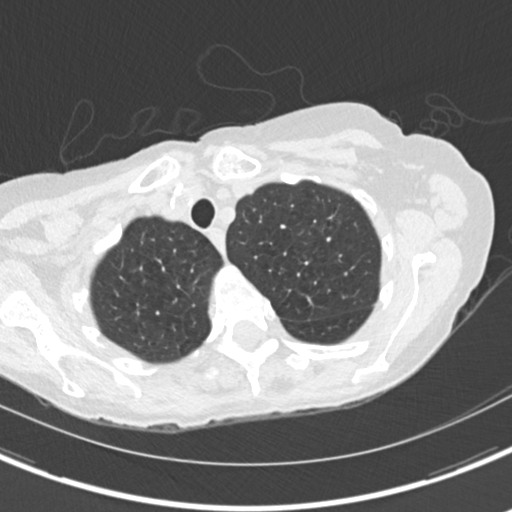
[im 121/131  lung]
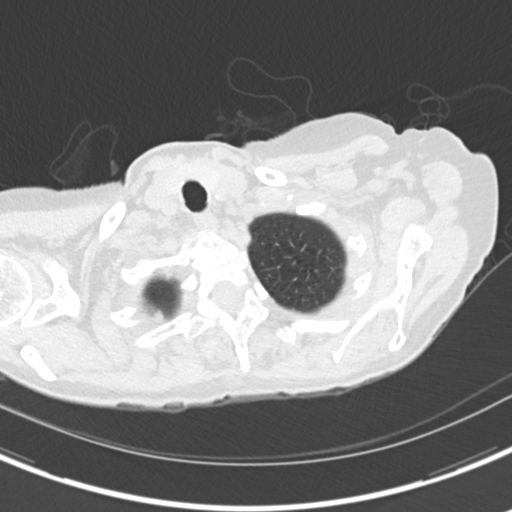

[Series 5: coronal · coronal · 0.54mm/px · 3 of 128 slices shown]
[im 26/128  lung]
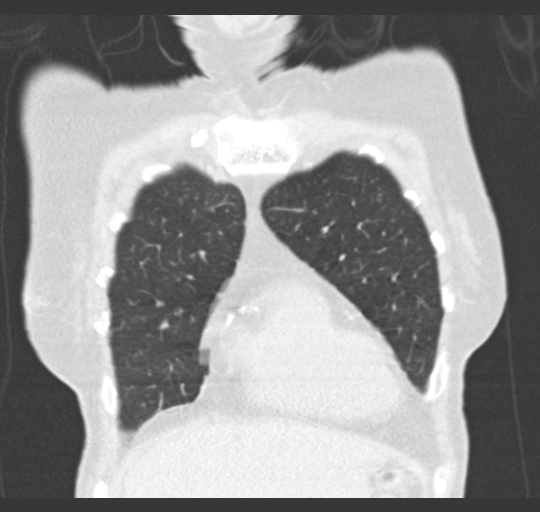
[im 51/128  lung]
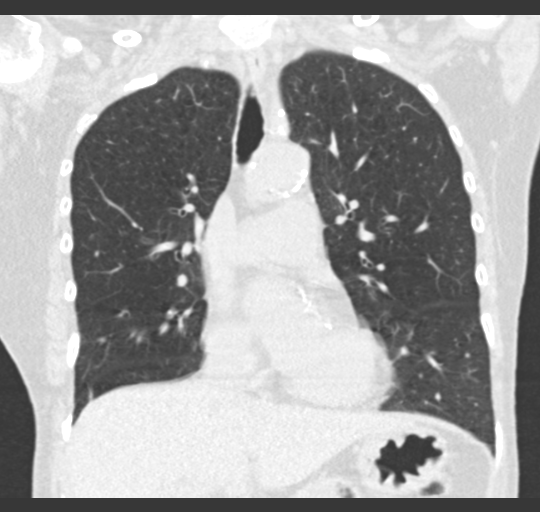
[im 77/128  lung]
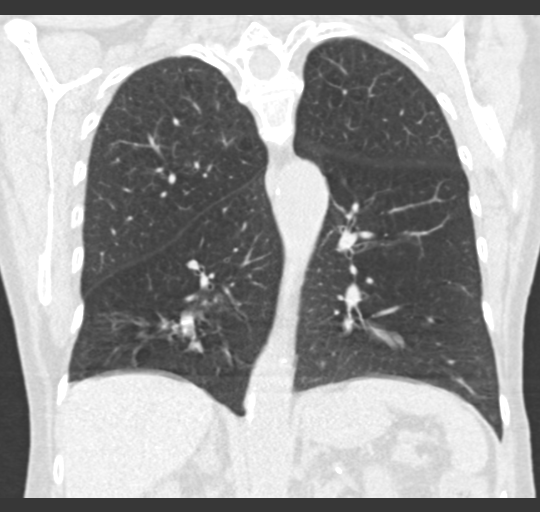

[15 of 36 positions shown; findings below may reference images not displayed]

FINDINGS: Cardiovascular: Normal heart size. No significant pericardial
effusion/thickening. Three-vessel coronary atherosclerosis.
Atherosclerotic nonaneurysmal thoracic aorta. Normal caliber
pulmonary arteries.

Mediastinum/Nodes: No discrete thyroid nodules. Unremarkable
esophagus. No pathologically enlarged axillary, mediastinal or hilar
lymph nodes, noting limited sensitivity for the detection of hilar
adenopathy on this noncontrast study.

Lungs/Pleura: No pneumothorax. No pleural effusion. Severe
centrilobular emphysema with mild diffuse bronchial wall thickening.
No acute consolidative airspace disease, lung masses or significant
pulmonary nodules. Previously visualized thickened bandlike
consolidation in the dependent basilar right lower lobe is decreased
in thickness, compatible with mild postinfectious/postinflammatory
scarring. Previously visualized patchy mild tree-in-bud opacities in
the lower lobes bilaterally have resolved. Previously visualized
central right lower lobe 1.1 cm and 0.3 cm solid pulmonary nodules
have resolved.

Upper abdomen: Subcentimeter hypodense left liver lesion is too
small to characterize and is unchanged, probably benign.

Musculoskeletal: No aggressive appearing focal osseous lesions.
Incompletely healed subacute mid sternal fracture with stable 3 mm
anterior displacement of the inferior fracture fragment with new
periosteal reaction and sclerosis at the fracture site. Mild
thoracic spondylosis.
IMPRESSION: 1. Resolved right lower lobe inflammatory nodularity. No acute
pulmonary disease.
2. Severe centrilobular emphysema with mild diffuse bronchial wall
thickening, suggesting COPD.
3. Incompletely healed subacute mid sternal fracture.
4. Aortic Atherosclerosis ([id number].A) and Emphysema (GI8OB-RXX.0).

## 2022-10-19 IMAGING — CR DG FOOT COMPLETE 3+V*R*
3 series · 3 of 3 positions shown · non-contrast
Comparison: None.

CLINICAL DATA: Pain status post fall

EXAM:
RIGHT FOOT COMPLETE - 3+ VIEW

[foot ap]
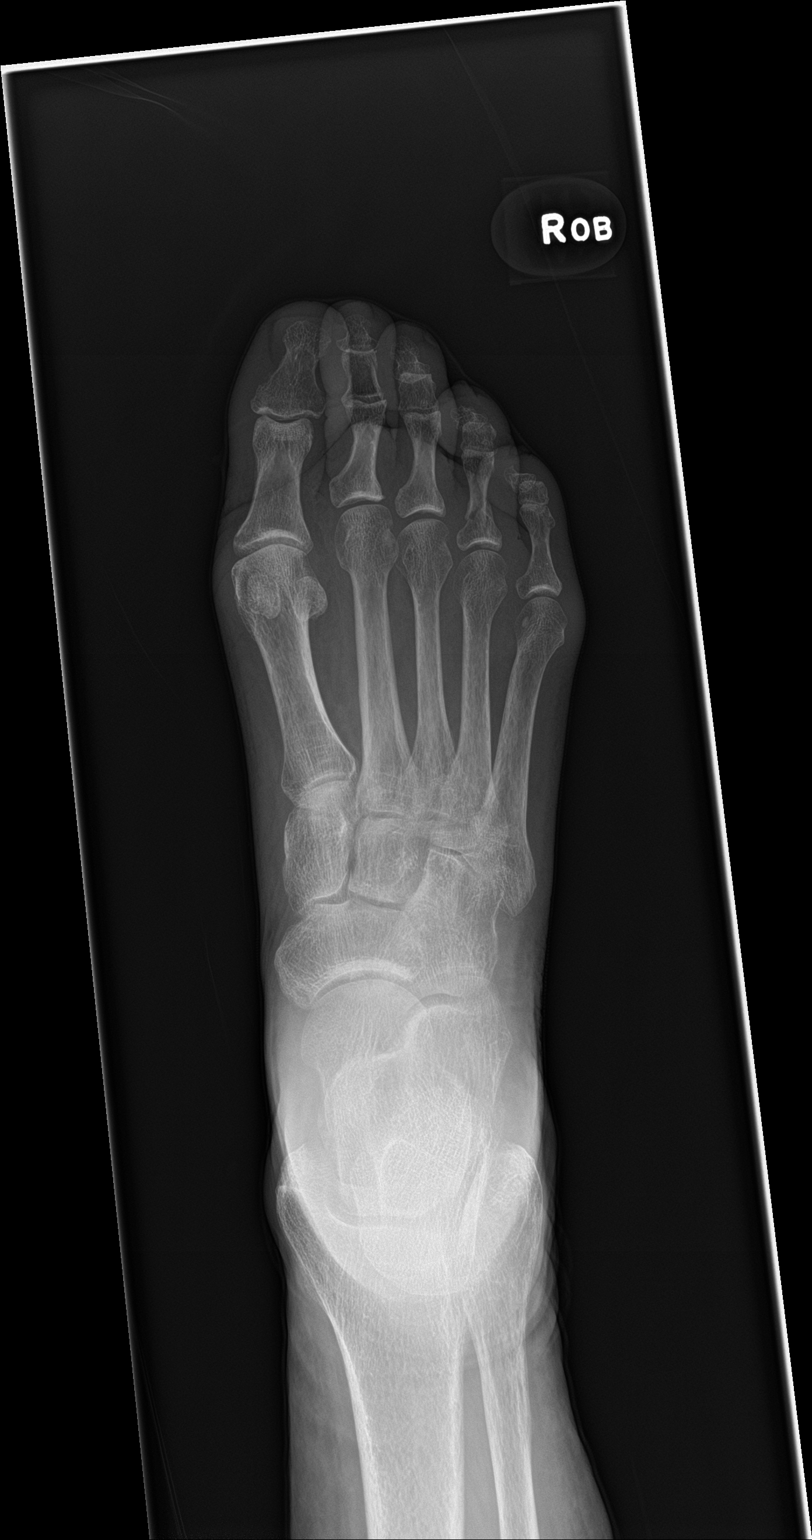

[foot obl]
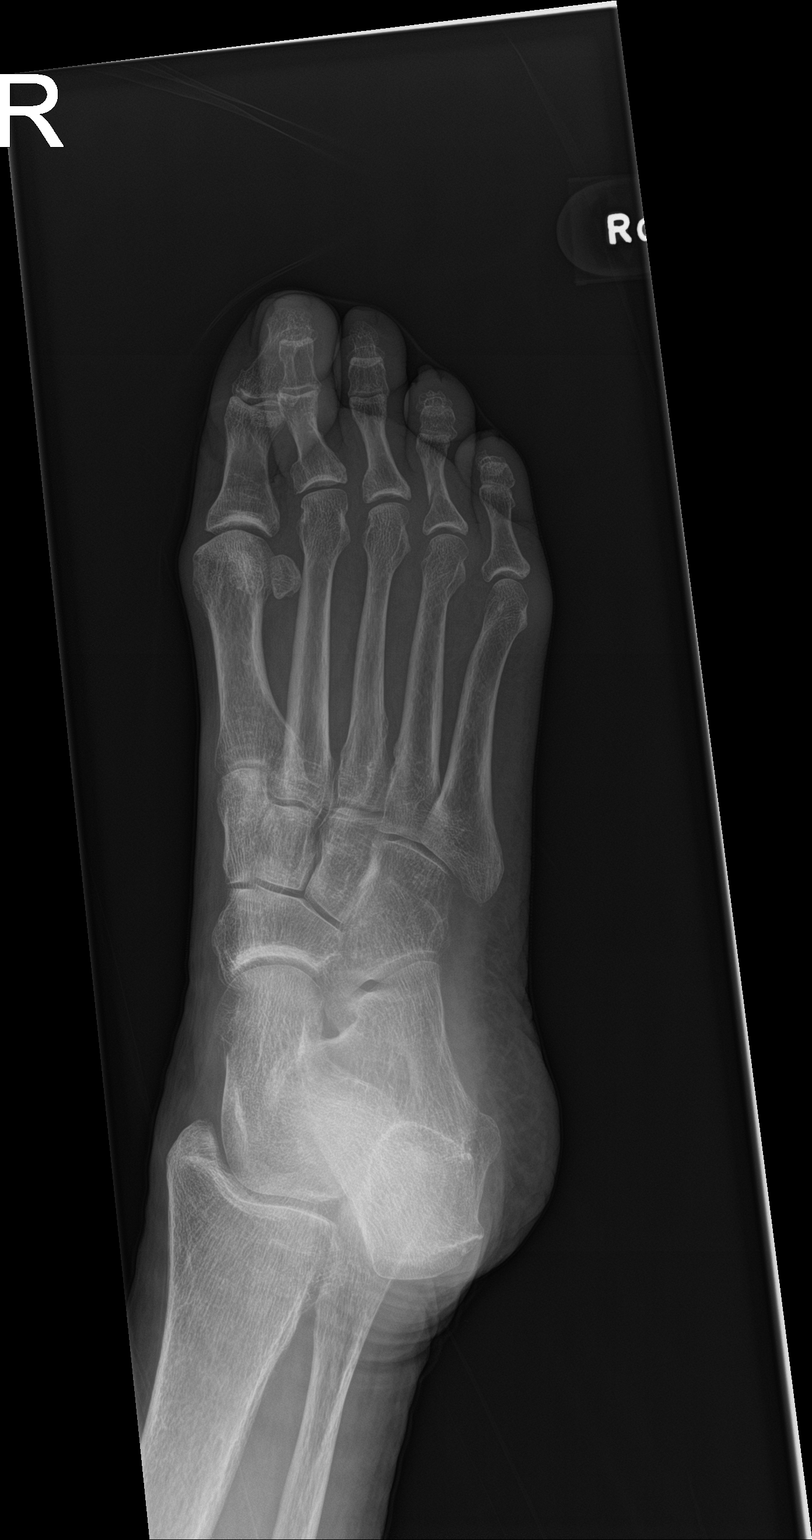

[foot lat]
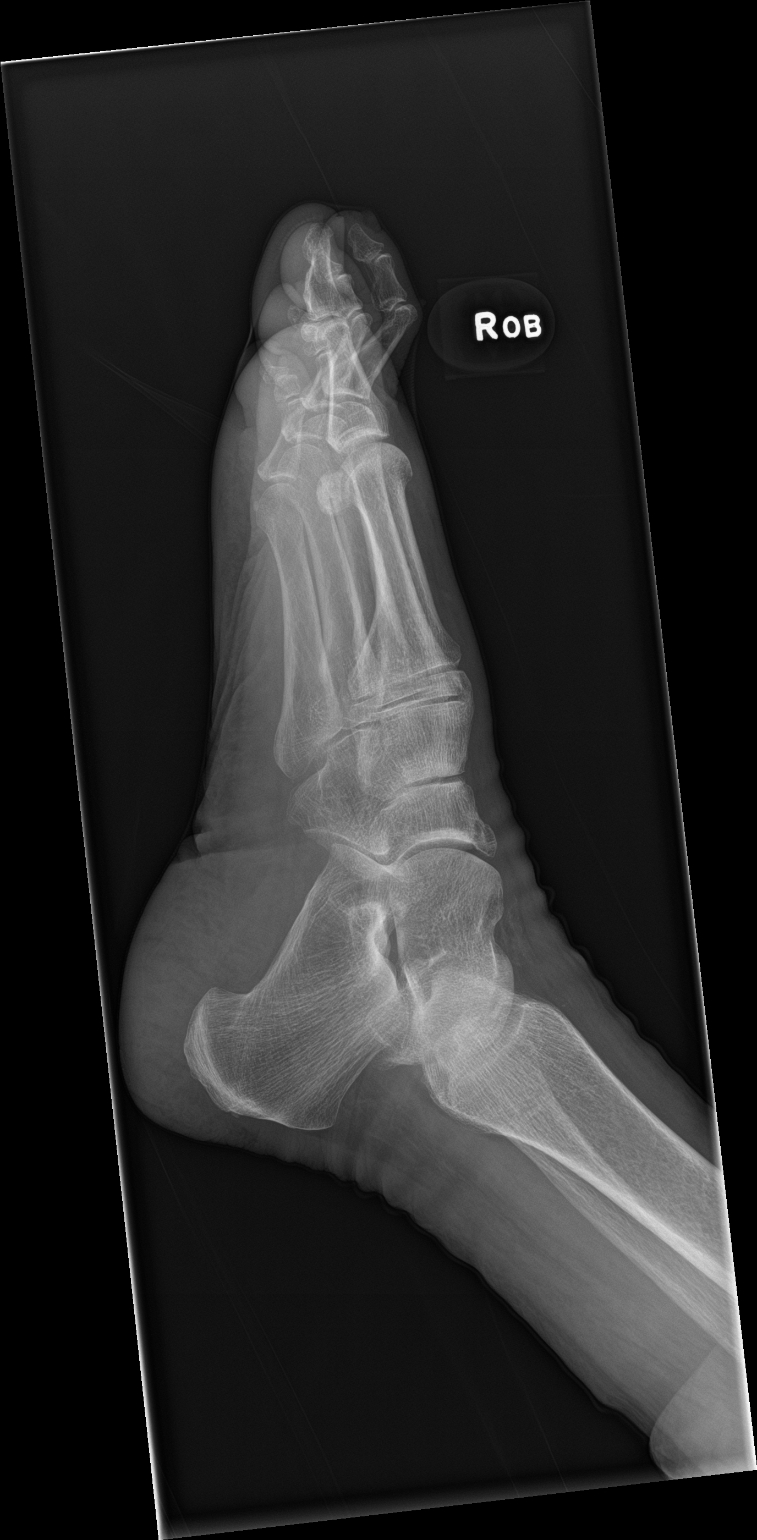

[3 of 3 positions shown; findings below may reference images not displayed]

FINDINGS: There is an acute oblique minimally displaced fracture through the
proximal phalanx of the fourth digit. There is no dislocation.
IMPRESSION: Acute oblique minimally displaced fracture through the proximal
phalanx of the fourth digit.

## 2022-10-19 IMAGING — CR DG LUMBAR SPINE 2-3V
2 series · 2 of 2 positions shown · non-contrast
Comparison: None.

CLINICAL DATA: Pain status post fall

EXAM:
LUMBAR SPINE - 2-3 VIEW

[l-spine ap]
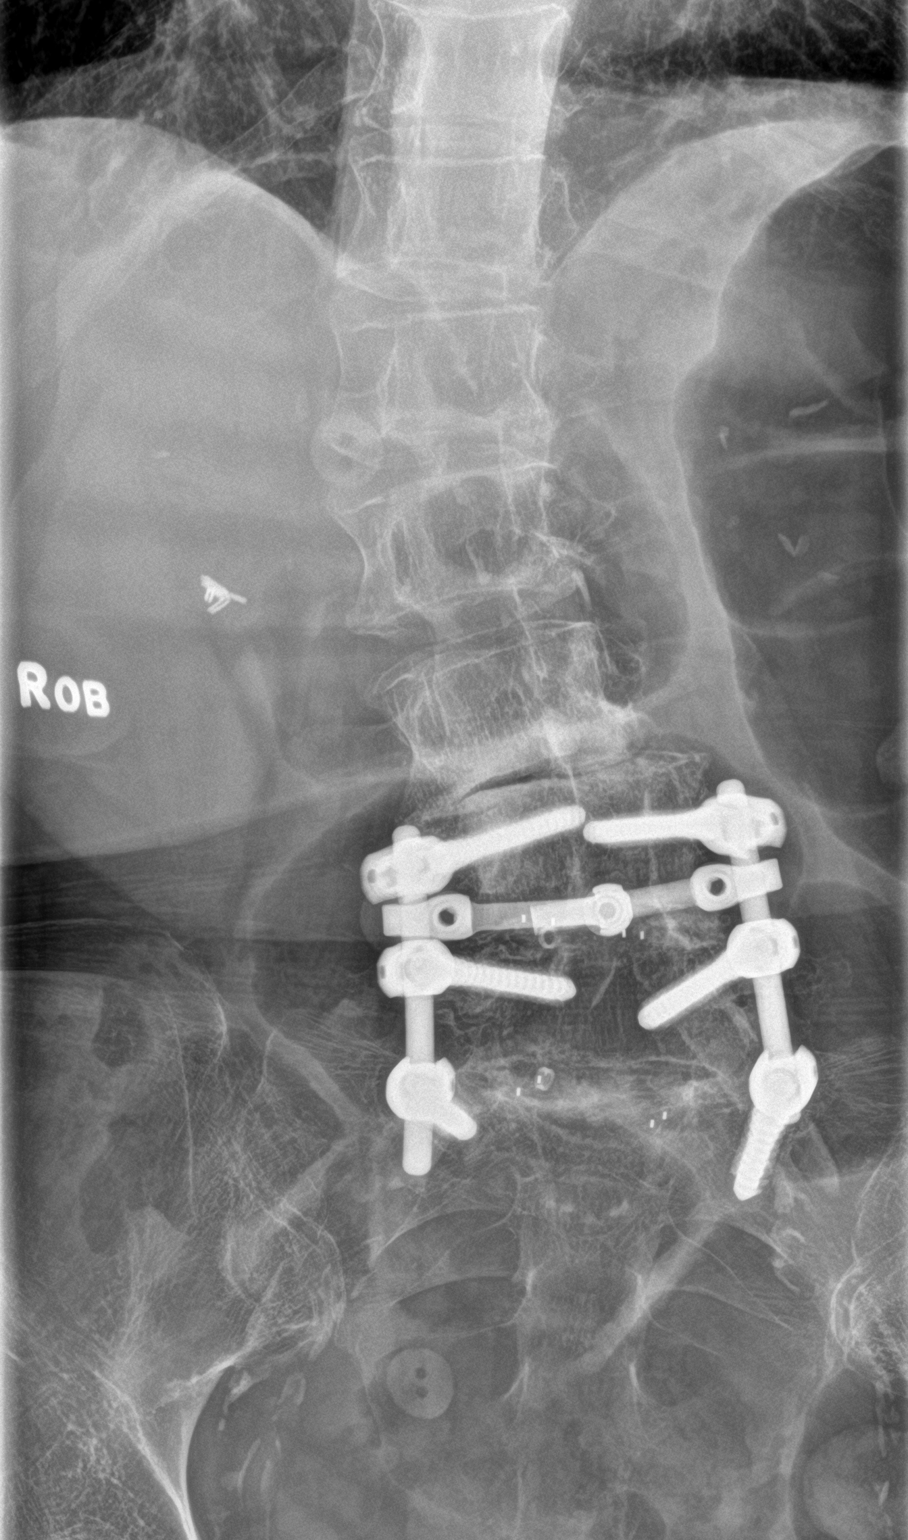

[l-spine lat]
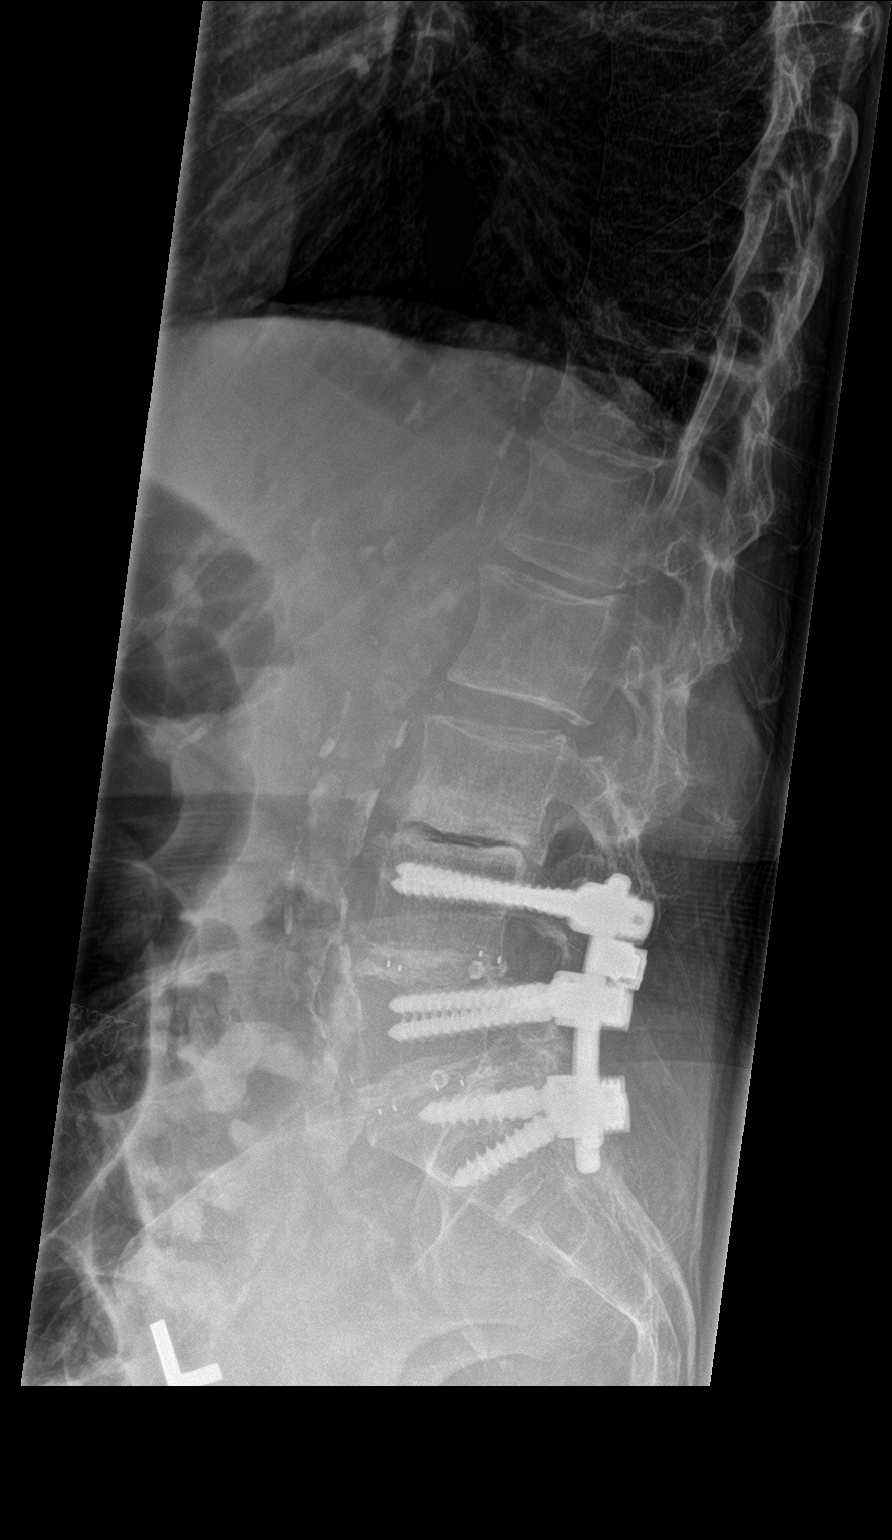

[2 of 2 positions shown; findings below may reference images not displayed]

FINDINGS: The patient is status post prior L4 through S1 posterior fusion.
There is severe disc height loss at the L3-L4 disc space. There is
no definite acute compression fracture. Vascular calcifications are
noted.
IMPRESSION: Negative.

## 2022-10-19 IMAGING — CR DG THORACIC SPINE 2V
3 series · 3 of 3 positions shown · non-contrast
Comparison: None.

CLINICAL DATA: Pain status post fall

EXAM:
THORACIC SPINE 2 VIEWS

[t-spine ap]
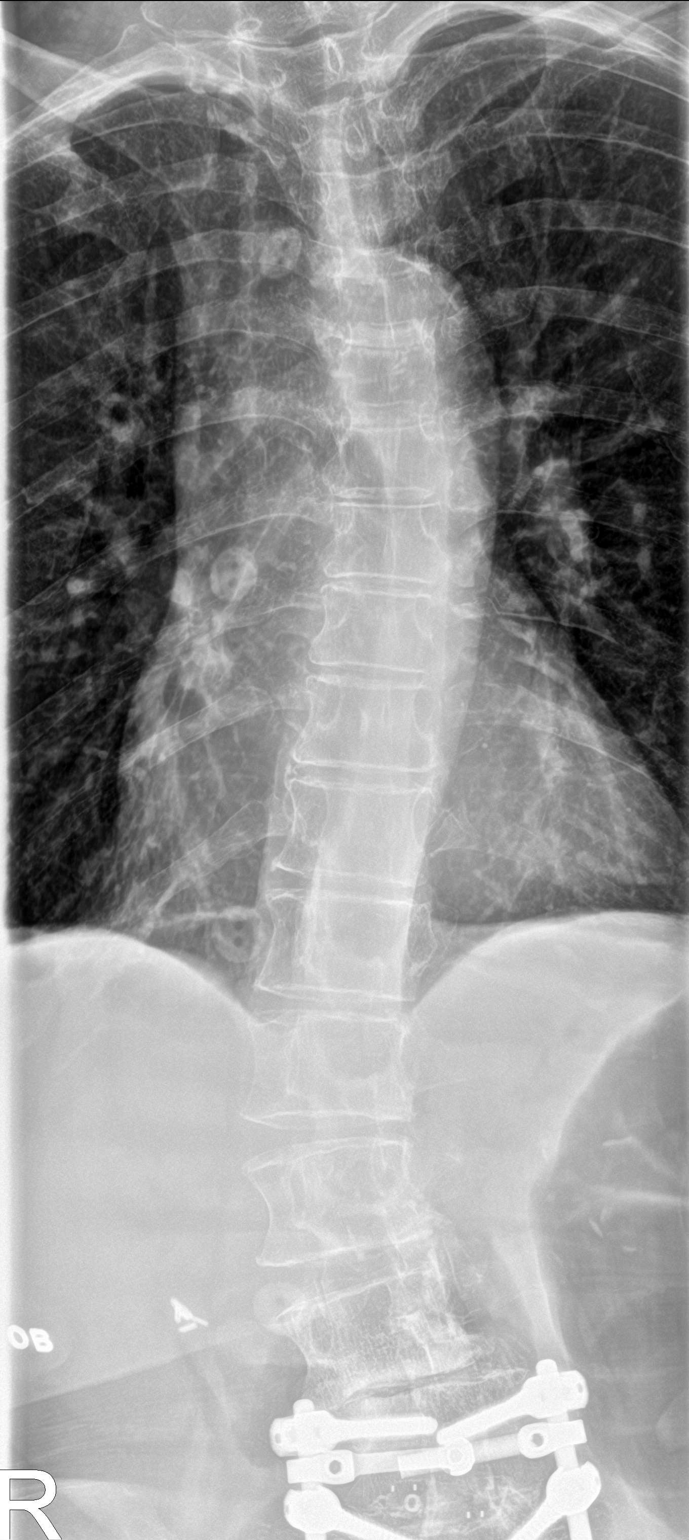

[t-spine lat]
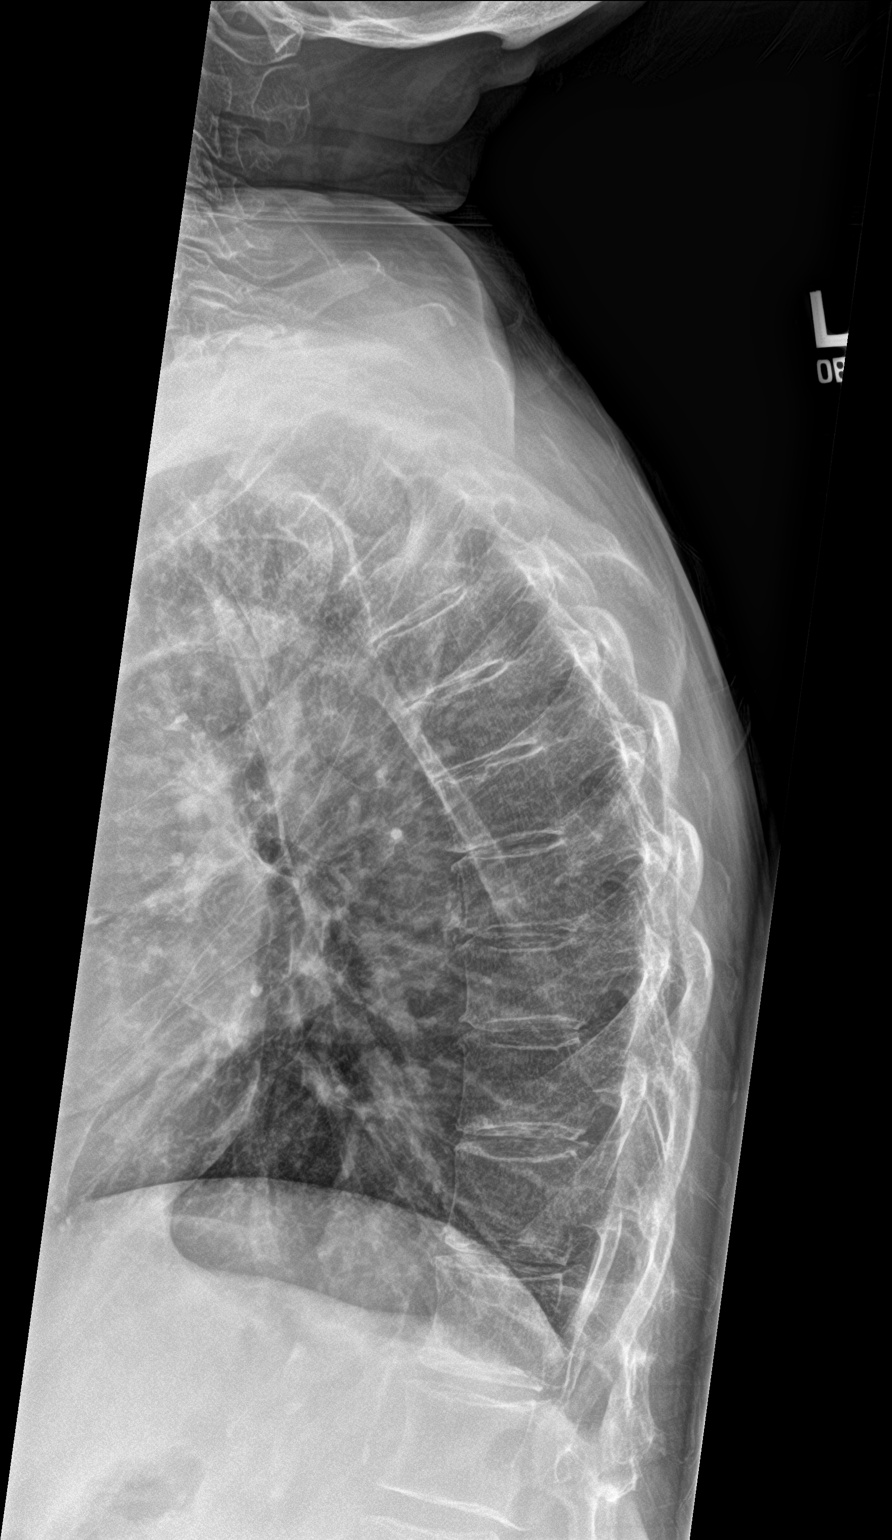

[t-spine swimmers]
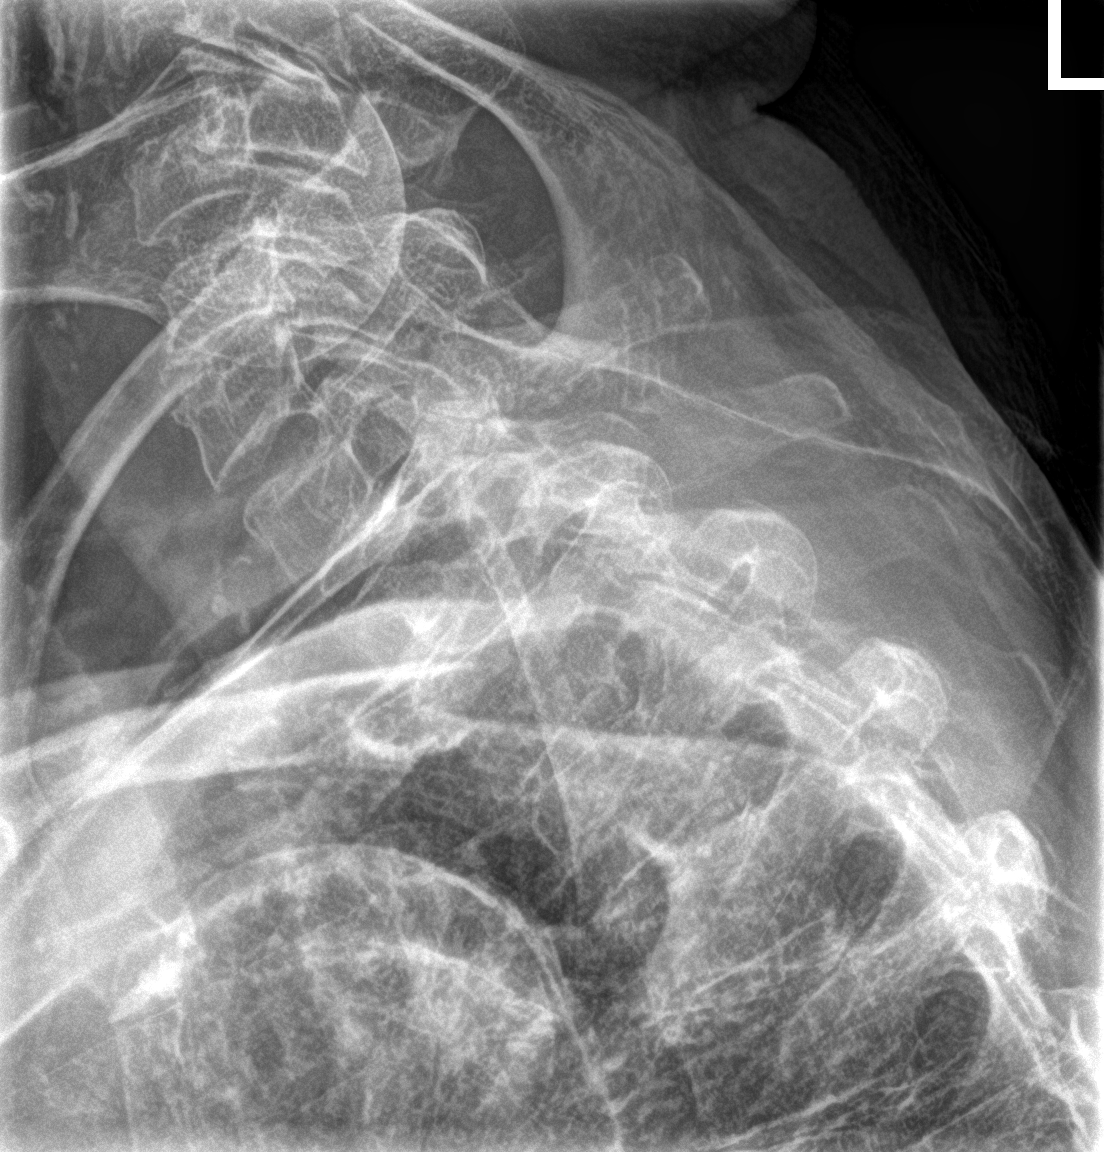

[3 of 3 positions shown; findings below may reference images not displayed]

FINDINGS: There is an S-shaped curvature of the thoracolumbar spine. No acute
compression fracture. There is osteopenia. Degenerative changes are
noted of the thoracic spine.
IMPRESSION: Negative.

## 2022-10-19 IMAGING — CR DG SACRUM/COCCYX 2+V
3 series · 3 of 3 positions shown · non-contrast
Comparison: CT abdomen pelvis dated 06/11/2020.

CLINICAL DATA: 69-year-old female with fall and pain in the
tailbone.

EXAM:
SACRUM AND COCCYX - 2+ VIEW

[coccyx ap]
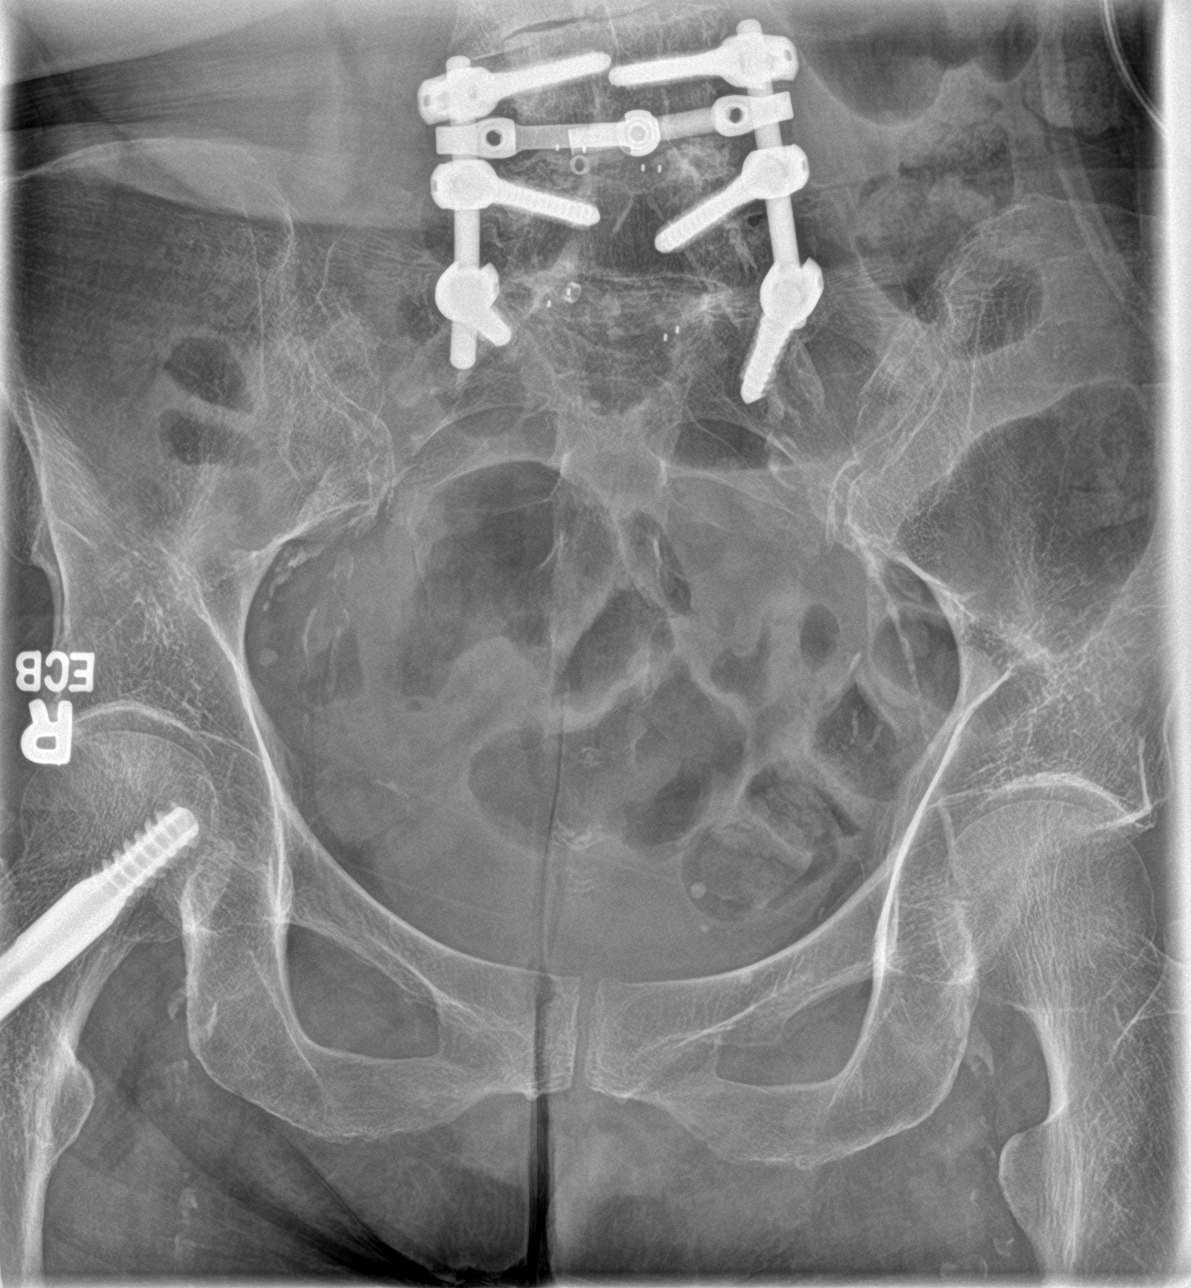

[sacrum ap]
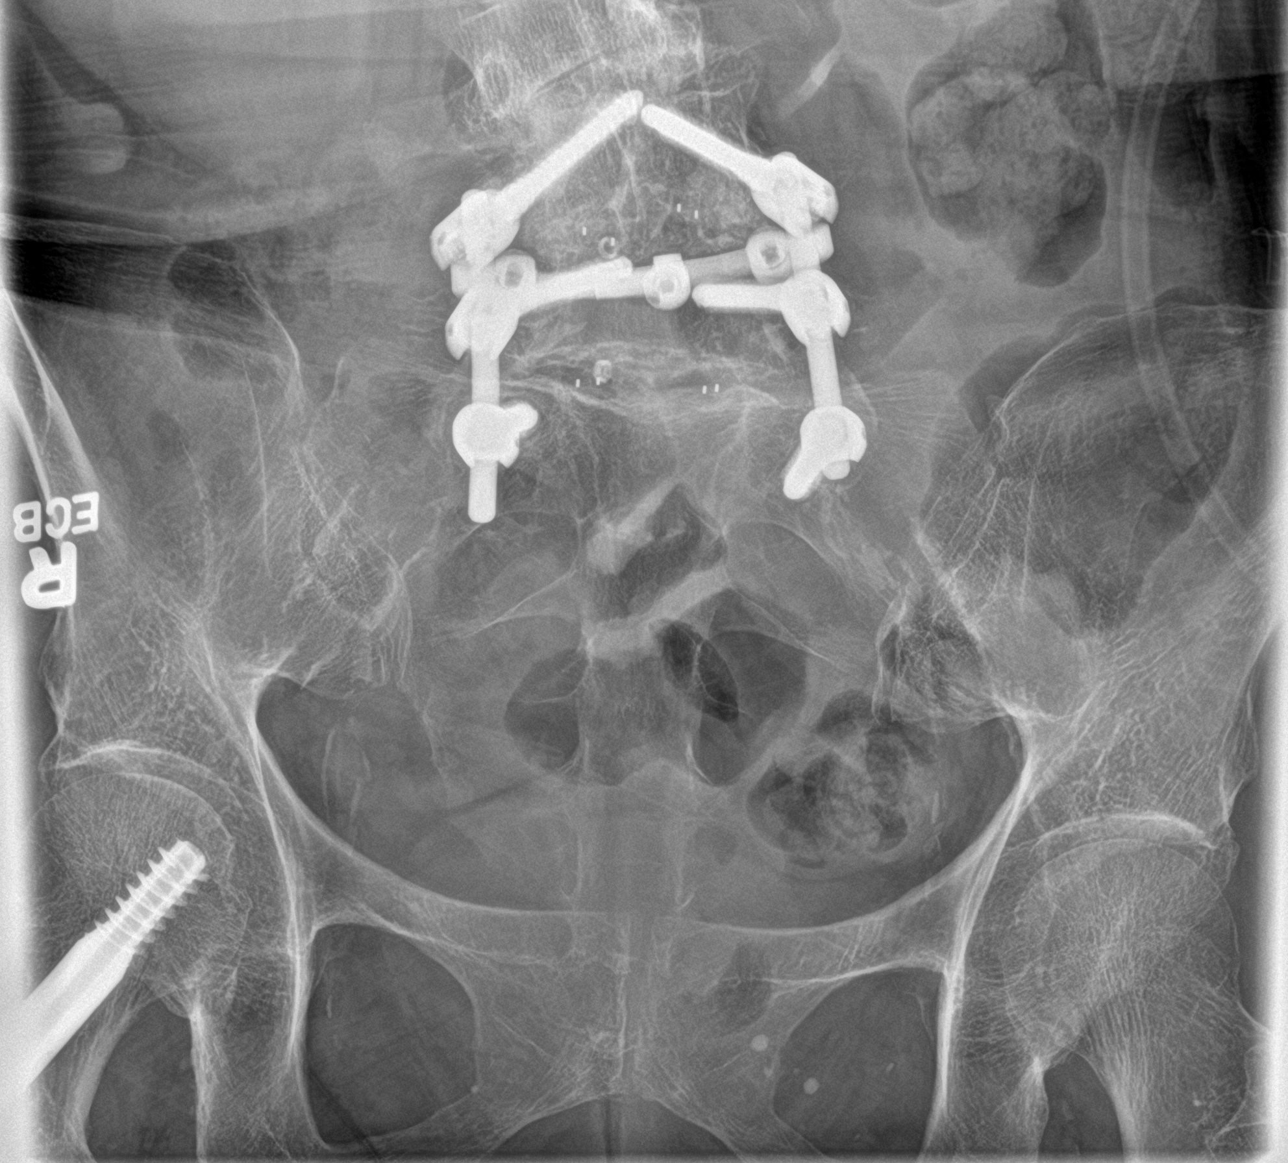

[sacrum lat]
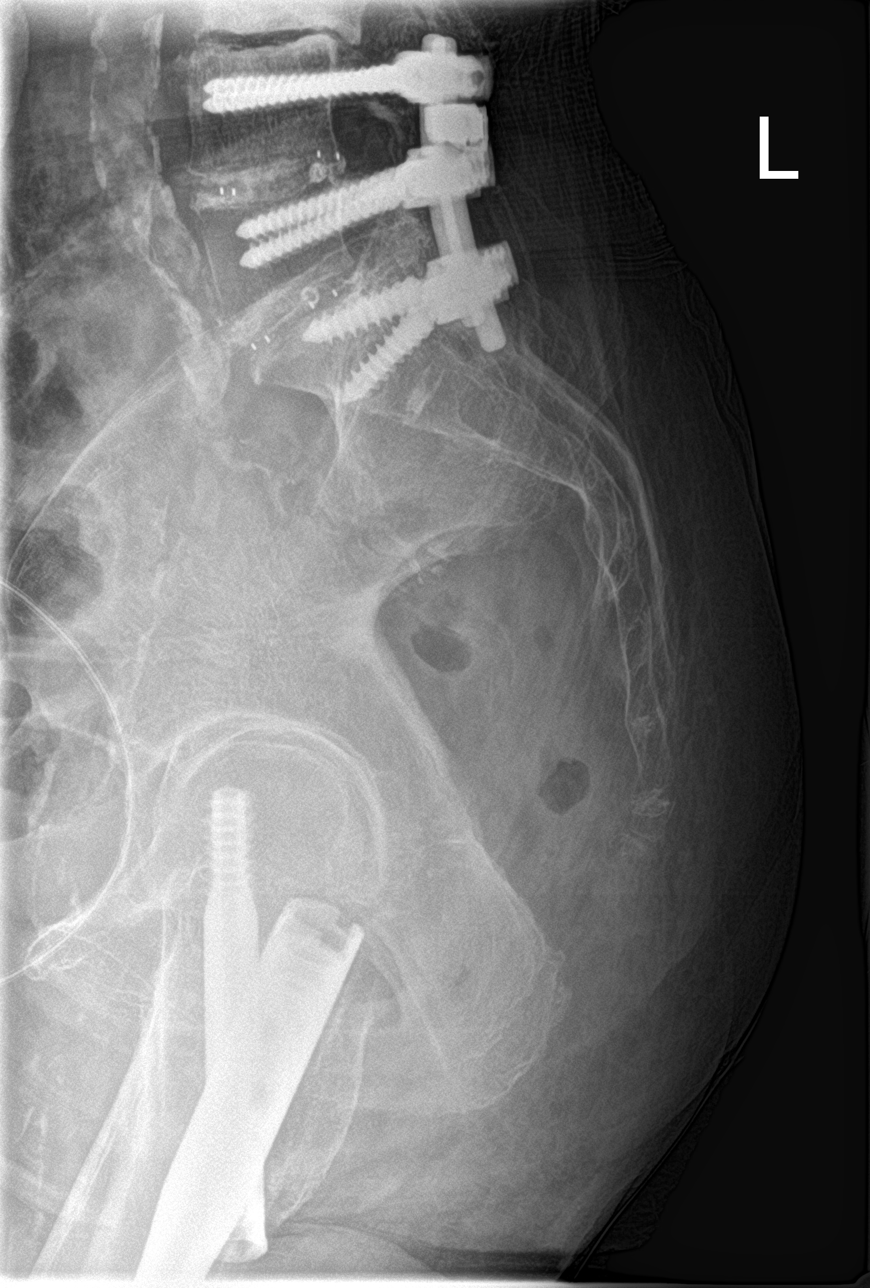

[3 of 3 positions shown; findings below may reference images not displayed]

FINDINGS: No acute fracture. Probable old healed fracture of the mid sacrum.
The bones are osteopenic. L4-S1 posterior fusion. Partially
visualized right femoral hardware. The soft tissues are
unremarkable.
IMPRESSION: No acute fracture.

## 2022-10-19 IMAGING — CT CT HEAD W/O CM
4 series · 17 of 47 positions shown, 19 images · non-contrast
Comparison: 07/04/2020

CLINICAL DATA: Fall, hit back of head

EXAM:
CT HEAD WITHOUT CONTRAST
TECHNIQUE: Contiguous axial images were obtained from the base of the skull
through the vertex without intravenous contrast.

[Series 4: head (person_name) · axial · 0.41mm/px · z∈[+1238,+1368]mm · 7 of 36 slices shown, 9 images]
[im 5/36  brain]
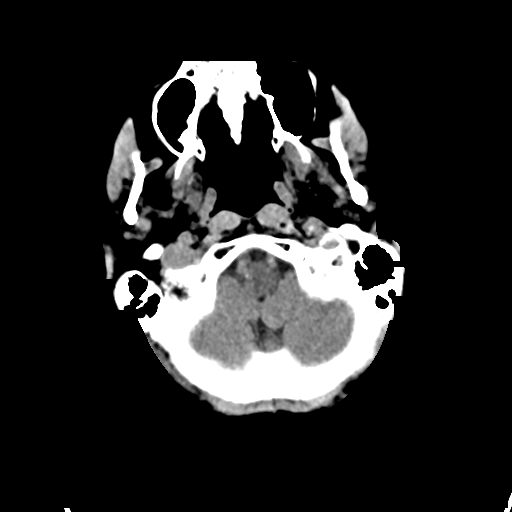
[im 5/36  bone]
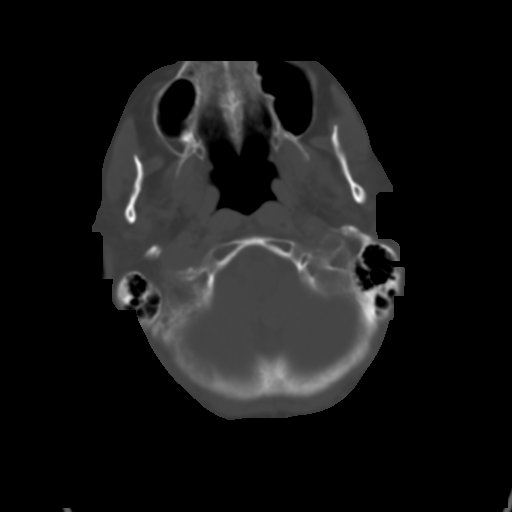
[im 9/36  brain]
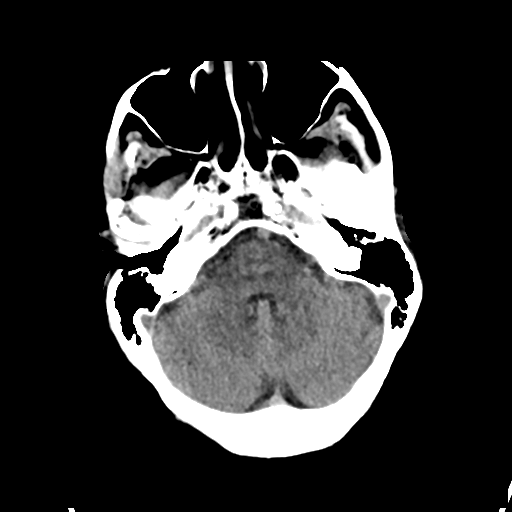
[im 14/36  brain]
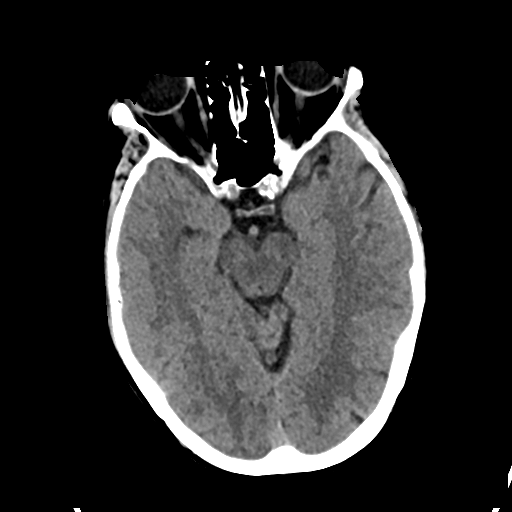
[im 18/36  brain]
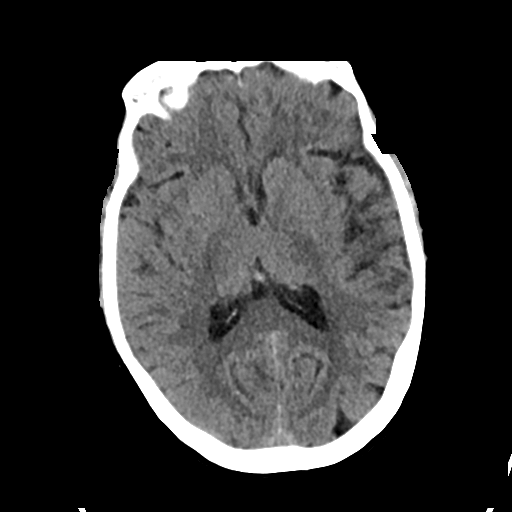
[im 22/36  brain]
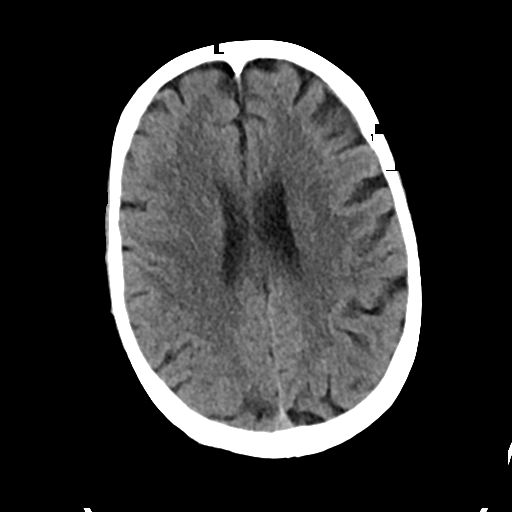
[im 22/36  bone]
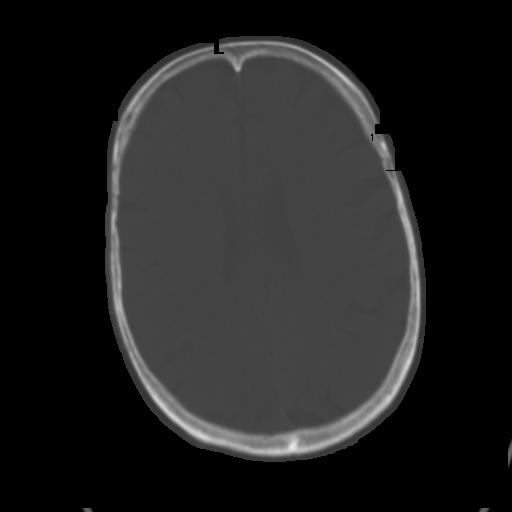
[im 27/36  brain]
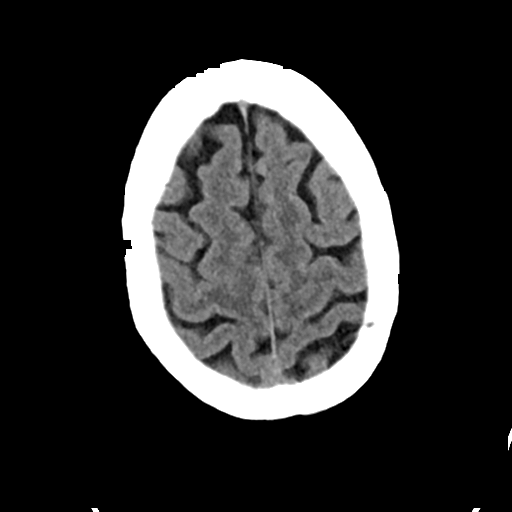
[im 31/36  brain]
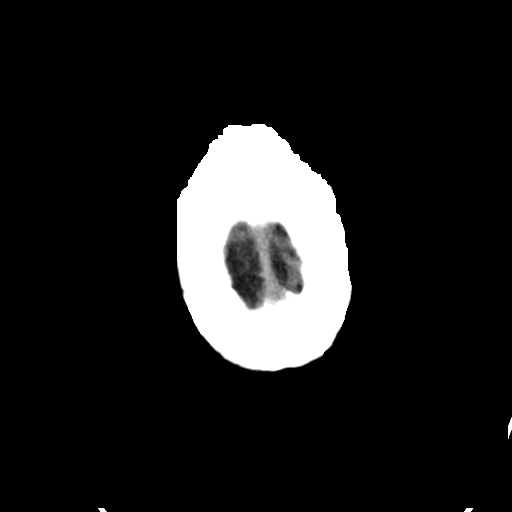

[Series 5: head bone · axial · 0.41mm/px · z∈[+1234,+1296]mm · 4 of 88 slices shown]
[im 9/88  bone]
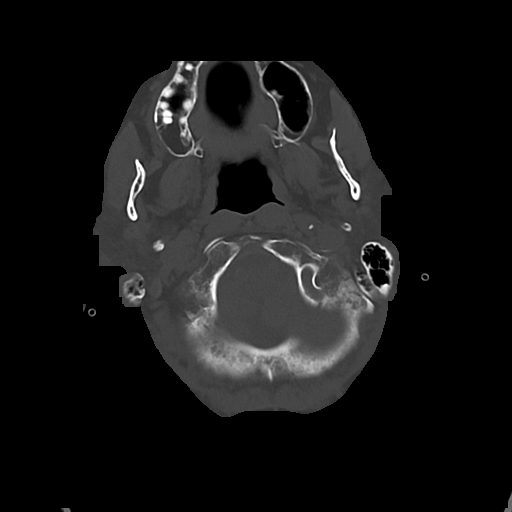
[im 18/88  bone]
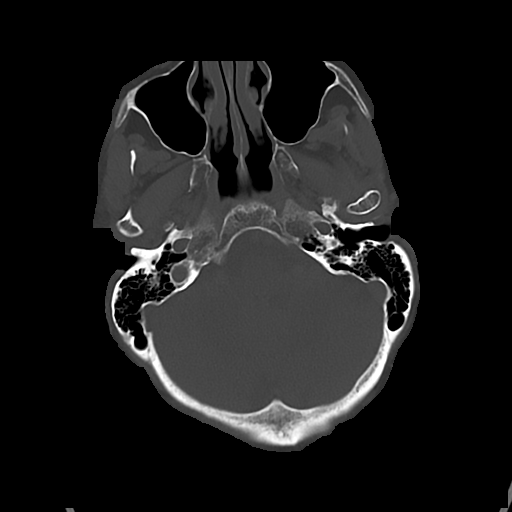
[im 27/88  bone]
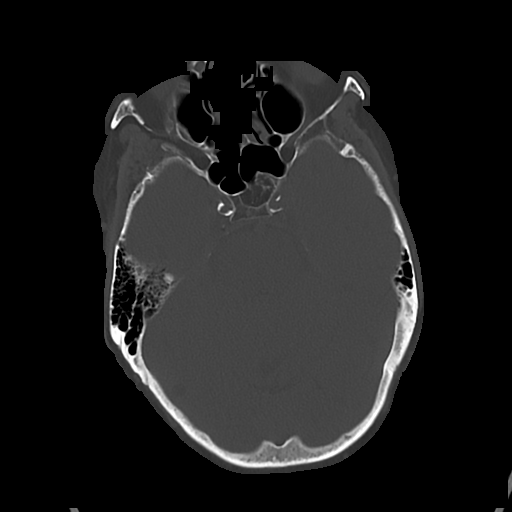
[im 40/88  bone]
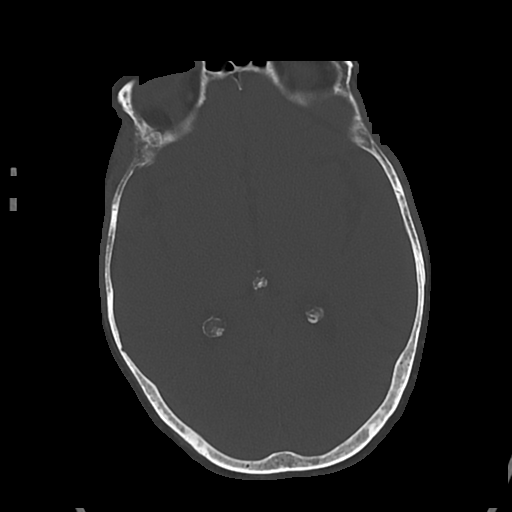

[Series 6: cor soft (person_name) · coronal · 0.34mm/px · 3 of 68 slices shown]
[im 23/68  brain]
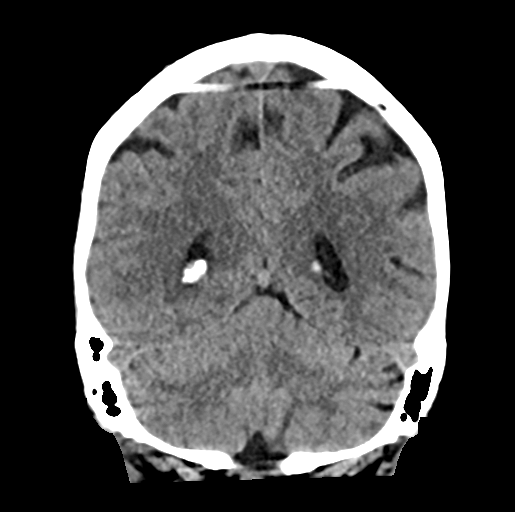
[im 30/68  brain]
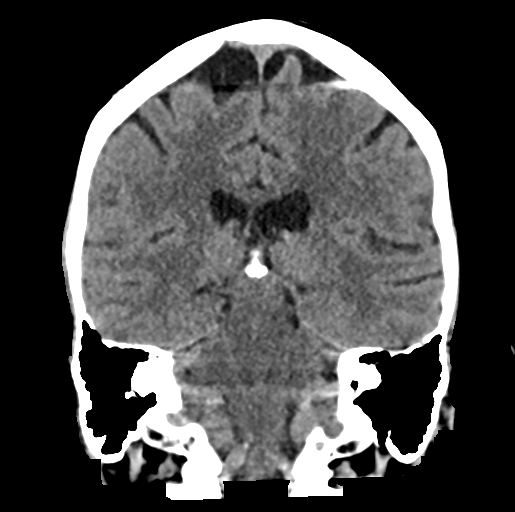
[im 38/68  brain]
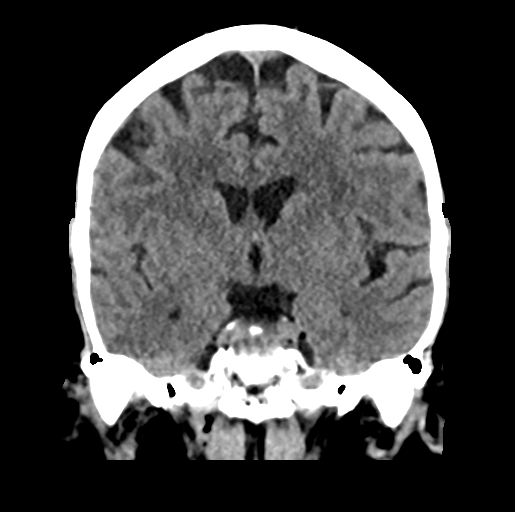

[Series 7: sag soft · sagittal · 0.34mm/px · 3 of 57 slices shown]
[im 19/57  brain]
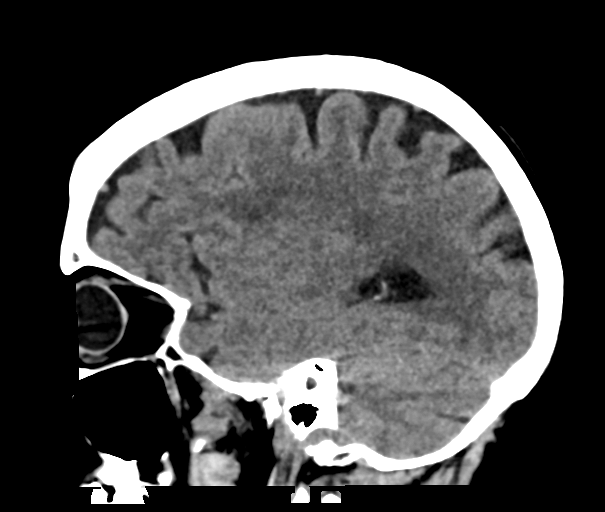
[im 29/57  brain]
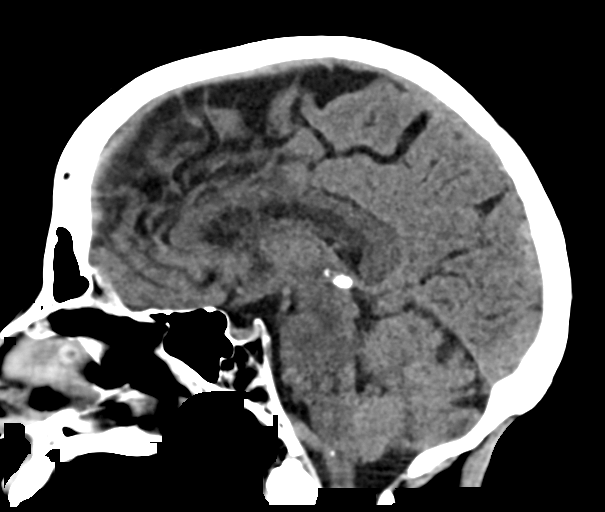
[im 38/57  brain]
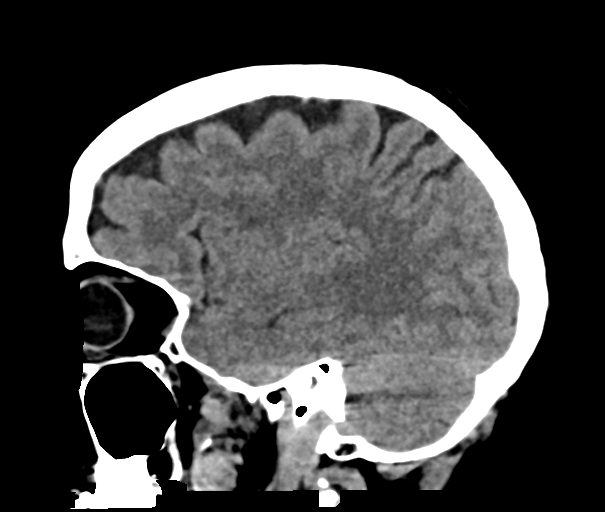

[17 of 47 positions shown; findings below may reference images not displayed]

FINDINGS: Brain: There is no acute intracranial hemorrhage, mass effect, or
edema. Gray-white differentiation is preserved. There is no
extra-axial fluid collection. Ventricles and sulci are within normal
limits in size and configuration.

Vascular: There is atherosclerotic calcification at the skull base.

Skull: Calvarium is unremarkable.

Sinuses/Orbits: No acute finding.

Other: None.
IMPRESSION: No evidence of acute traumatic injury.

## 2022-10-31 IMAGING — DX DG CHEST 1V PORT
1 series · 1 of 1 positions shown · non-contrast
Comparison: 07/08/2020

CLINICAL DATA: Lethargy yesterday. Syncopal episode today with some
confusion.

EXAM:
PORTABLE CHEST 1 VIEW

[chest ap]
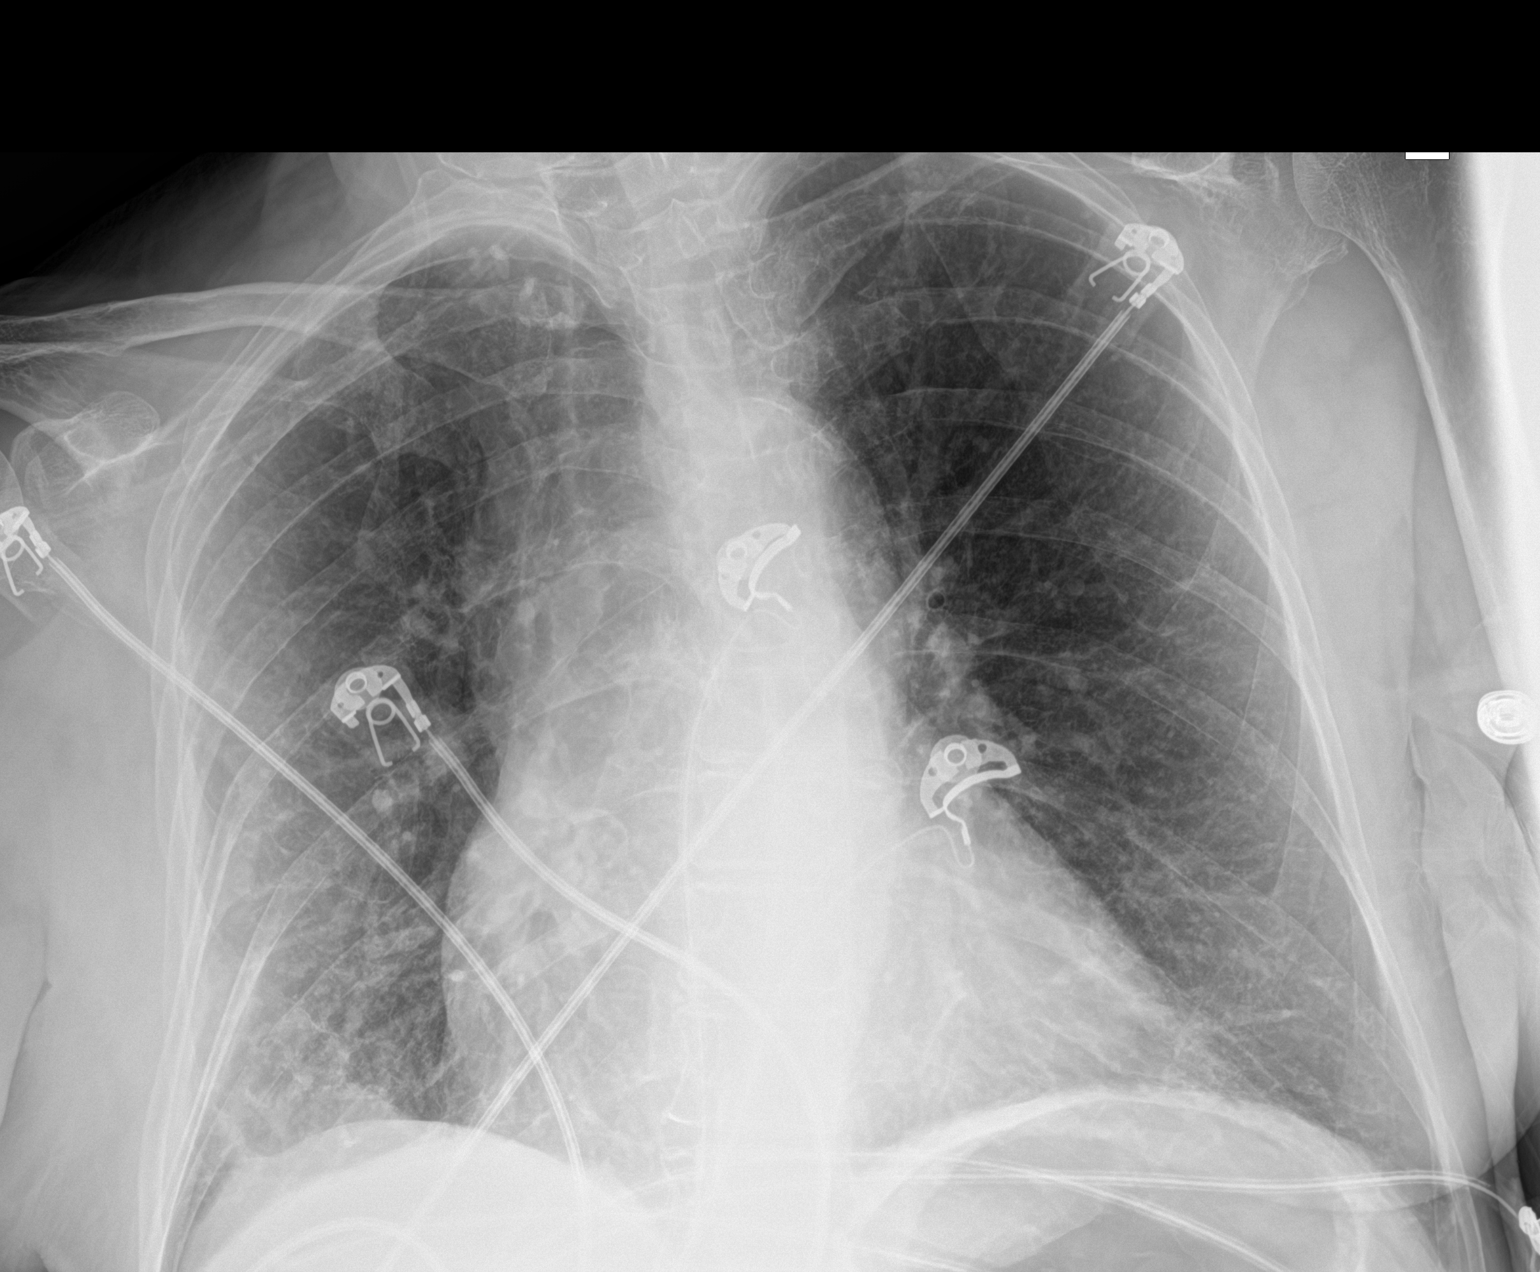

[1 of 1 positions shown; findings below may reference images not displayed]

FINDINGS: Cardiac silhouette is normal in size. No mediastinal or hilar
masses.

Prominent bronchovascular markings. Additional opacity noted the
lung bases, greater on the right, consistent with atelectasis and/or
scarring. No convincing pneumonia and no evidence of pulmonary
edema.

No pleural effusion or pneumothorax.

Skeletal structures demineralized, but grossly intact.
IMPRESSION: 1. No acute cardiopulmonary disease.

## 2022-11-01 IMAGING — CR DG HIP (WITH OR WITHOUT PELVIS) 2-3V*L*
3 series · 3 of 3 positions shown · non-contrast
Comparison: None.

CLINICAL DATA: Fall, left hip pain.

EXAM:
DG HIP (WITH OR WITHOUT PELVIS) 2-3V LEFT

[hip ap]
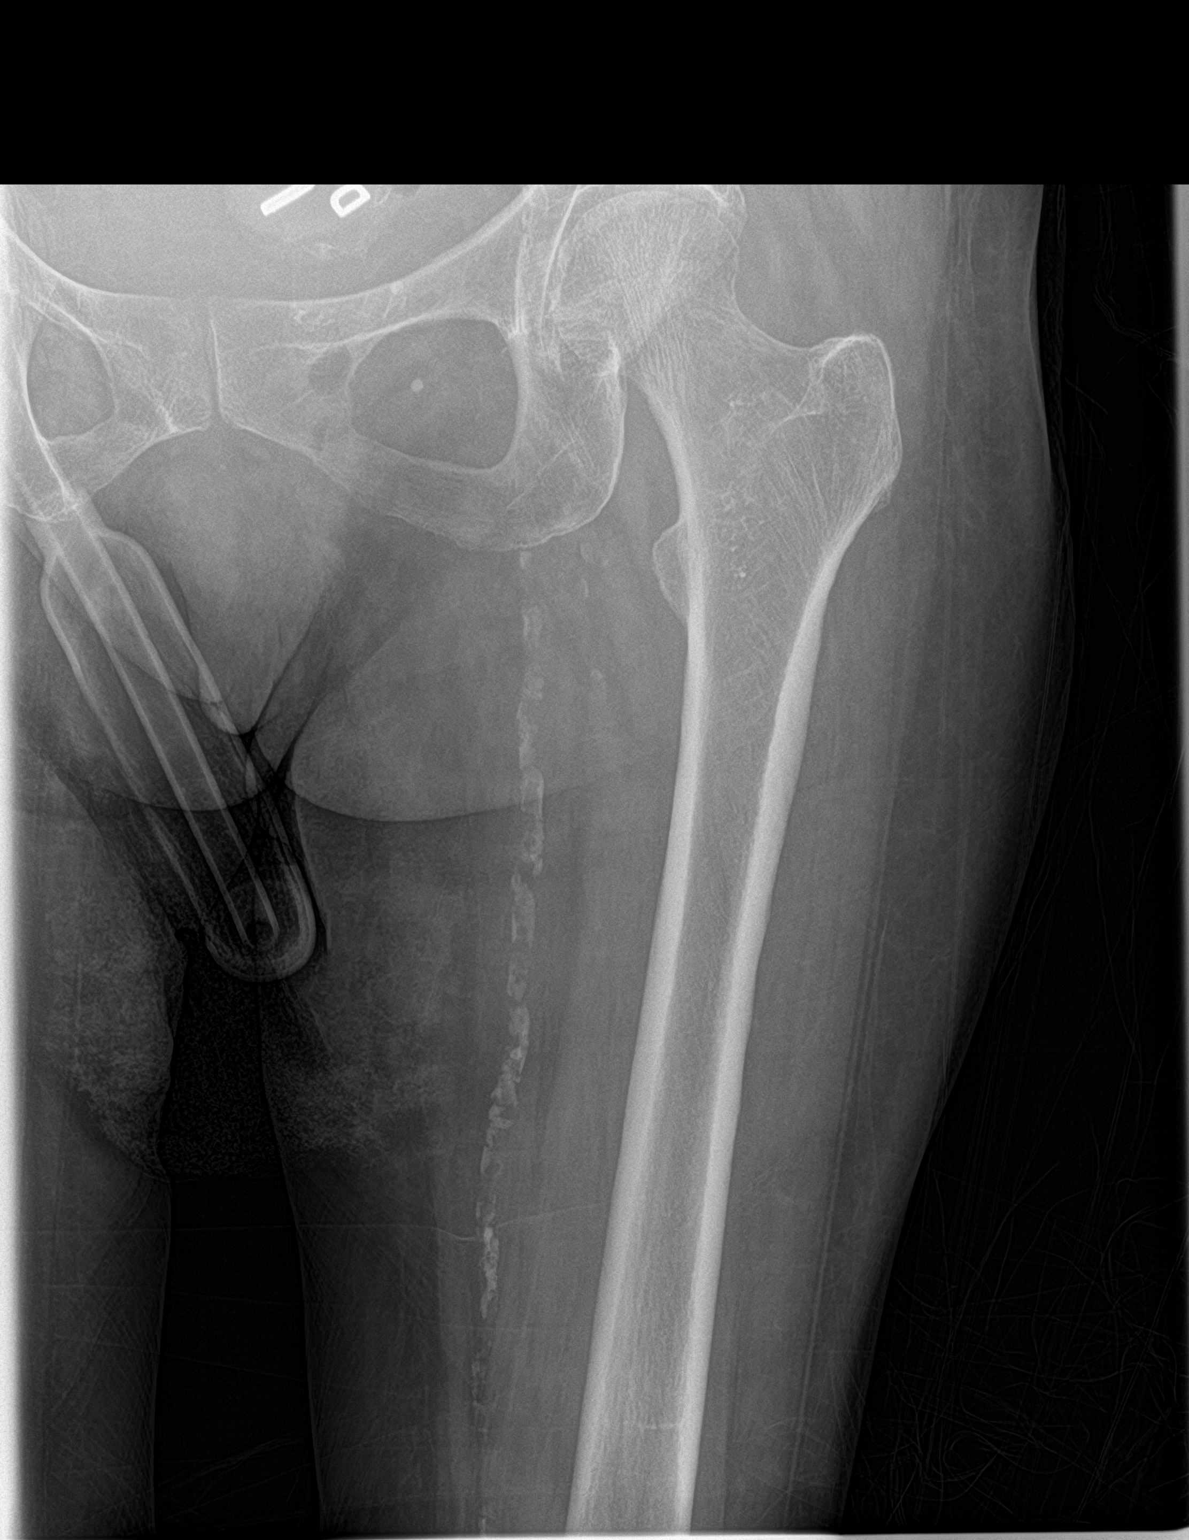

[hip lat]
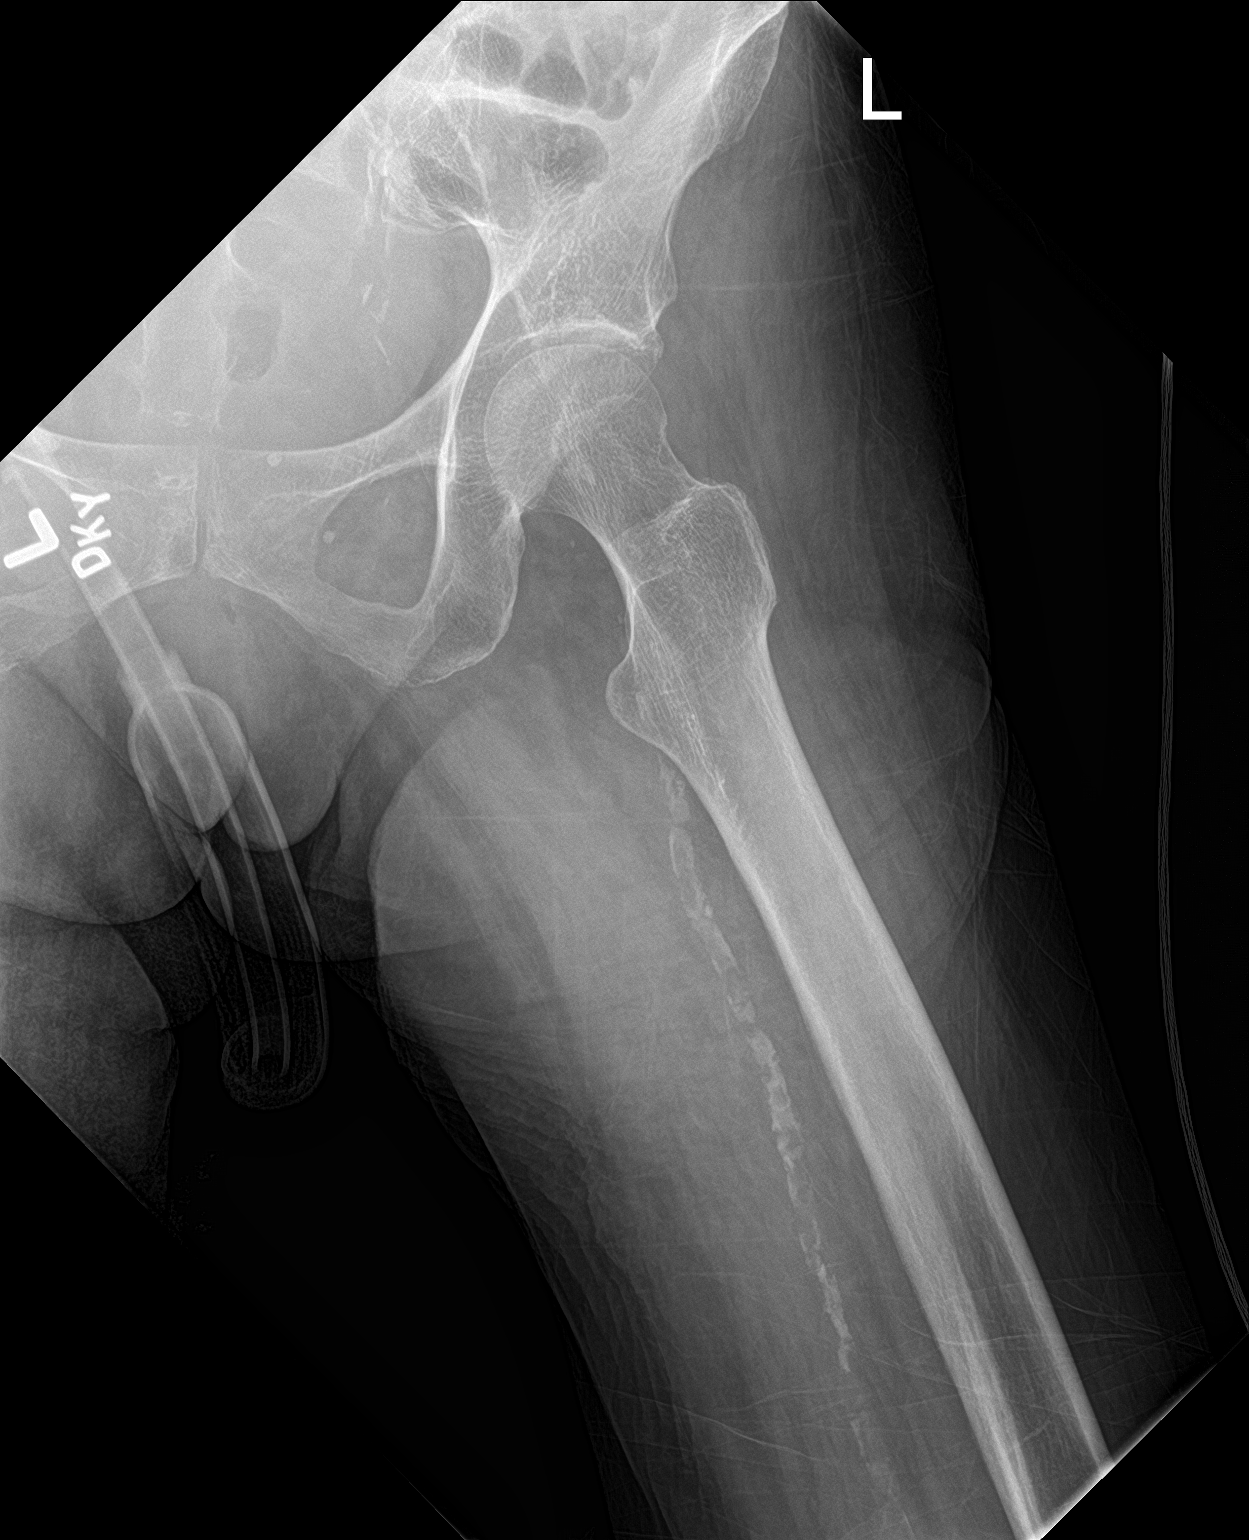

[pelvis ap]
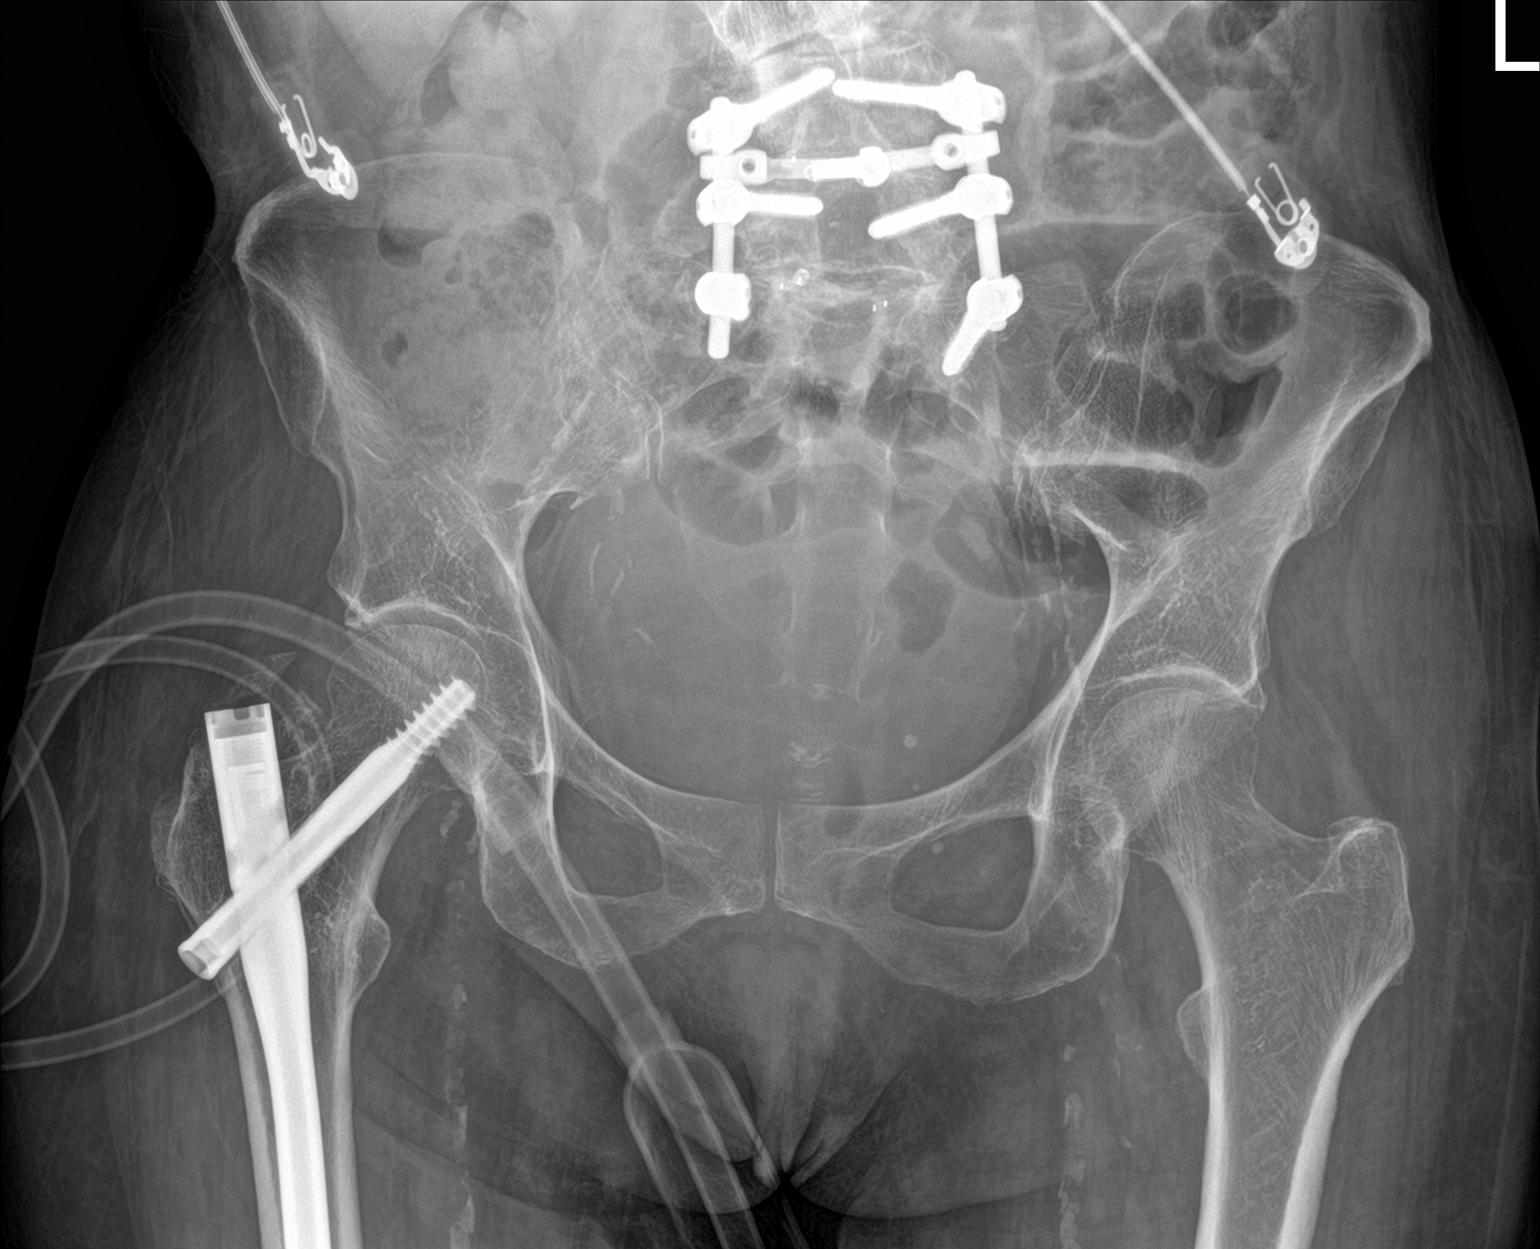

[3 of 3 positions shown; findings below may reference images not displayed]

FINDINGS: Intramedullary nail fixation of the right femur partially visualized
on frontal view. Frontal view of the right hip is grossly
unremarkable. Lumbosacral surgical hardware noted.

There is no evidence of left hip fracture or dislocation. Frontal
view of the pelvis demonstrates no acute displaced fracture or
diastasis.

Other than degenerative changes of the lumbar spine, no severe
arthropathy. No aggressive bone abnormality. Limited evaluation of
the lumbar spine on frontal view due to overlying bowel gas.

Extensive vascular calcifications.
IMPRESSION: 1. No acute displaced fracture or dislocation.
2. Extensive vascular calcifications.

## 2022-11-02 IMAGING — CT CT HIP*L* W/O CM
2 of 4 series · 17 of 46 positions shown, 19 images · non-contrast
Comparison: X-ray 02/10/2021

CLINICAL DATA: Left hip pain after fall on 02/08/2021

EXAM:
CT OF THE LEFT HIP WITHOUT CONTRAST
TECHNIQUE: Multidetector CT imaging of the left hip was performed according to
the standard protocol. Multiplanar CT image reconstructions were
also generated.

[Series 8: coronal st · coronal · 0.45mm/px · 3 of 99 slices shown]
[im 33/99  soft-tissue]
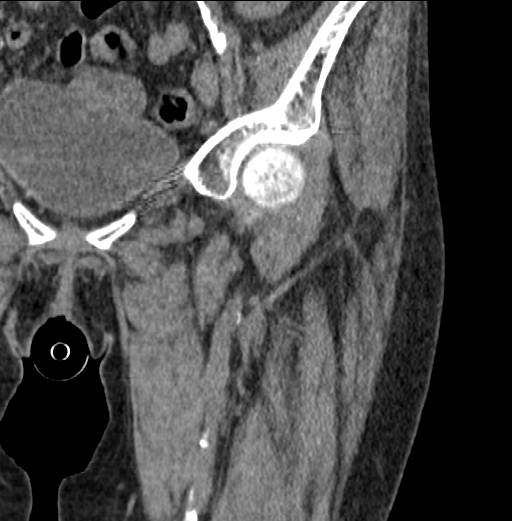
[im 44/99  soft-tissue]
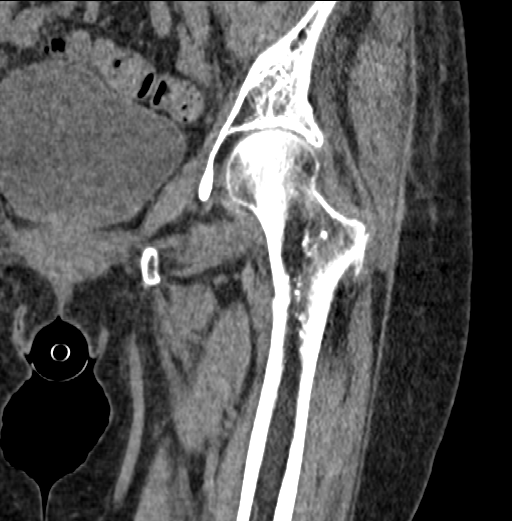
[im 55/99  soft-tissue]
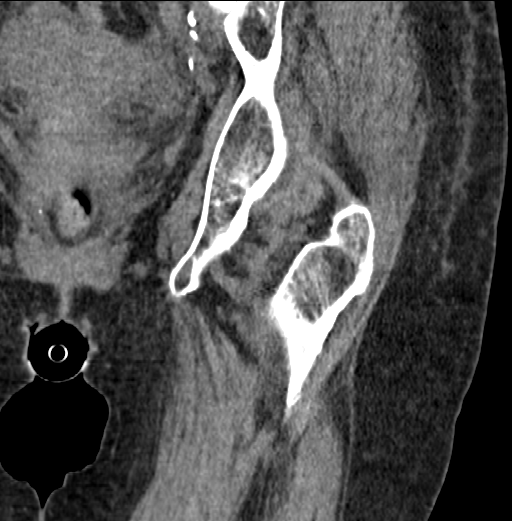

[Series 10: left hip thin (person_name) · axial · 0.53mm/px · z∈[+846,+1059]mm · 14 of 389 slices shown, 16 images]
[im 17/389  soft-tissue]
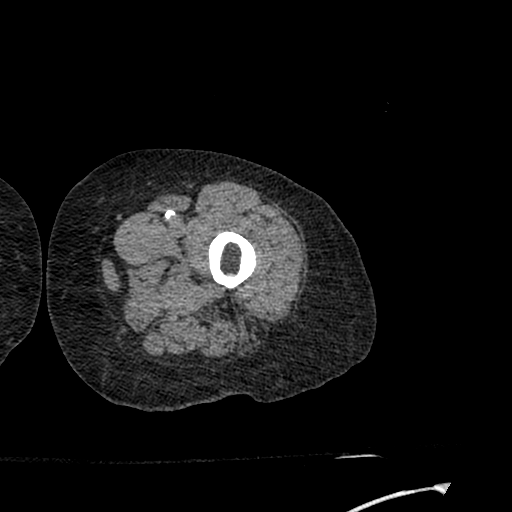
[im 17/389  bone]
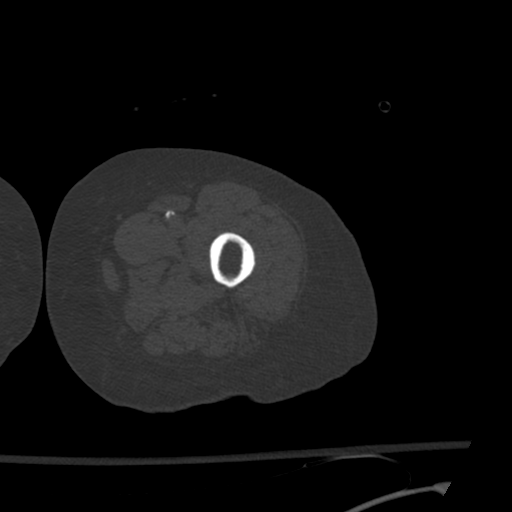
[im 49/389  soft-tissue]
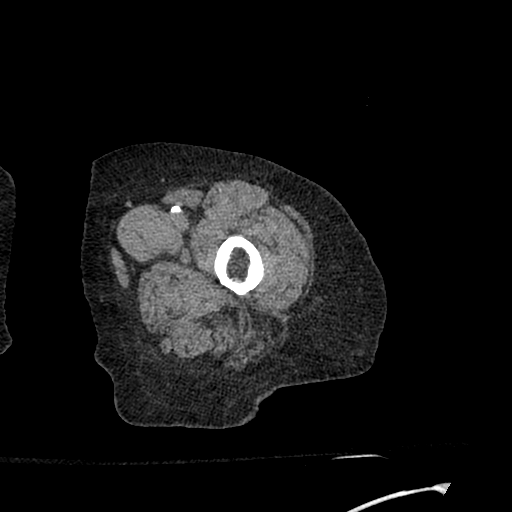
[im 81/389  soft-tissue]
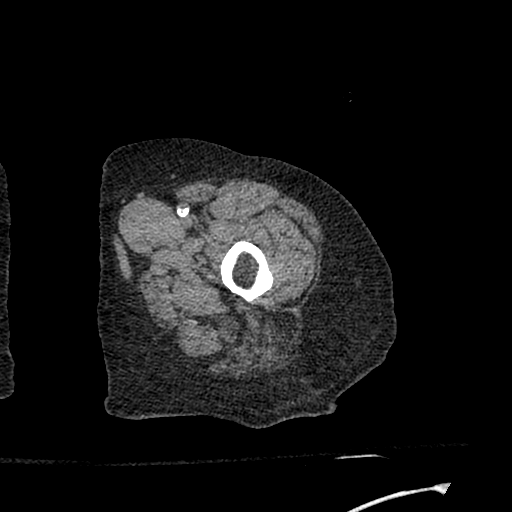
[im 98/389  soft-tissue]
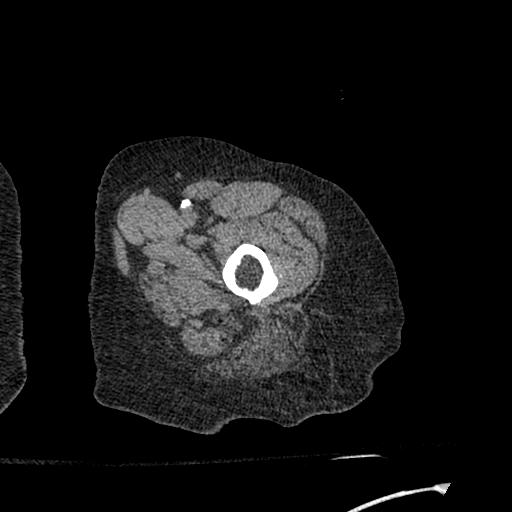
[im 130/389  soft-tissue]
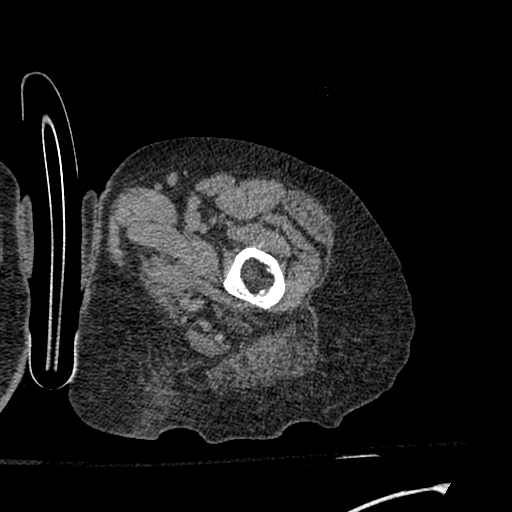
[im 162/389  soft-tissue]
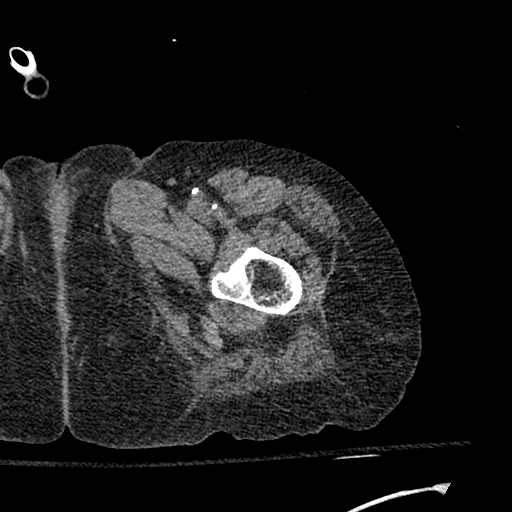
[im 178/389  soft-tissue]
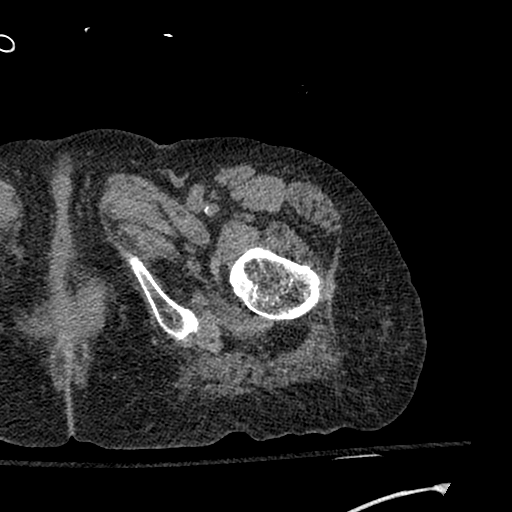
[im 211/389  soft-tissue]
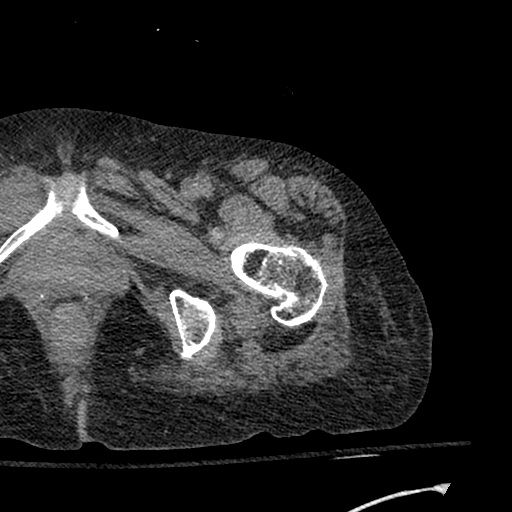
[im 227/389  soft-tissue]
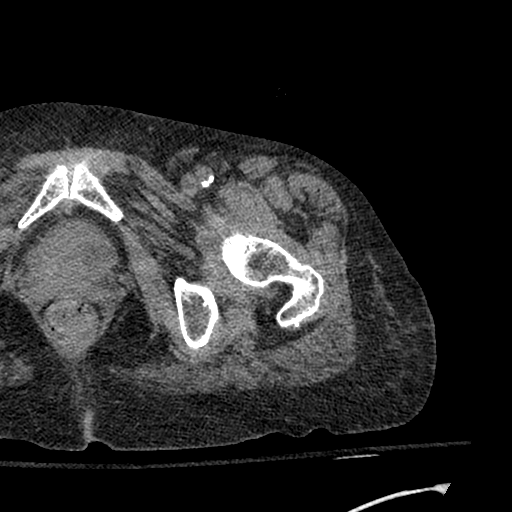
[im 227/389  bone]
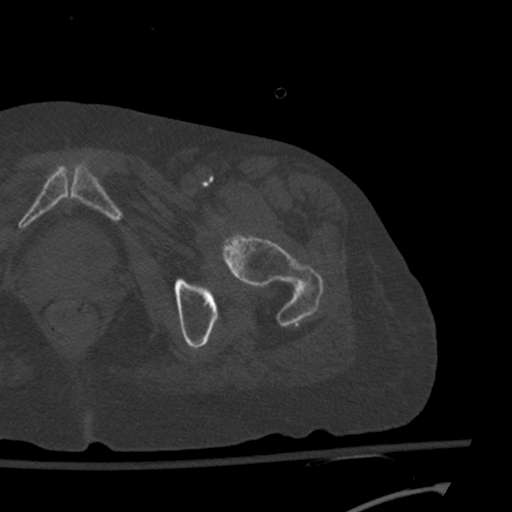
[im 259/389  soft-tissue]
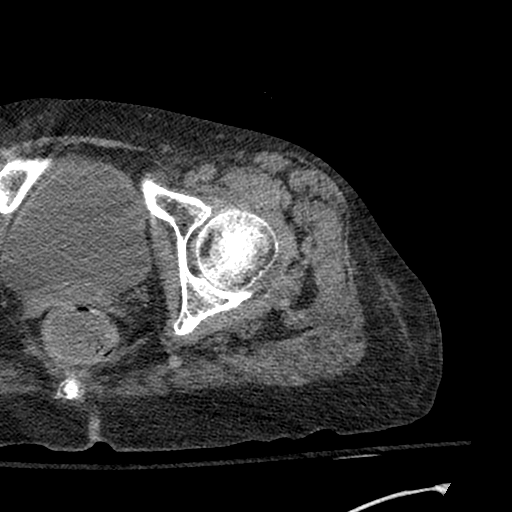
[im 292/389  soft-tissue]
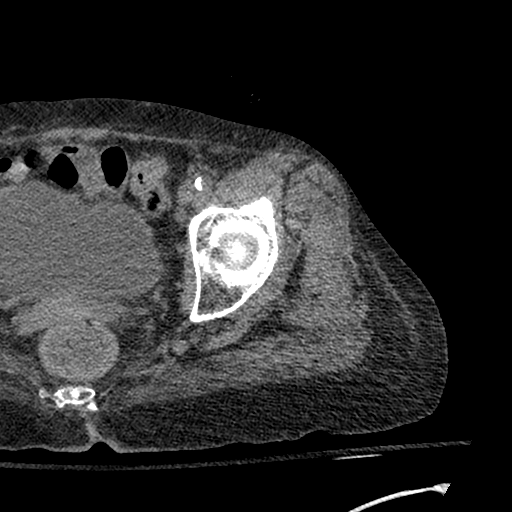
[im 308/389  soft-tissue]
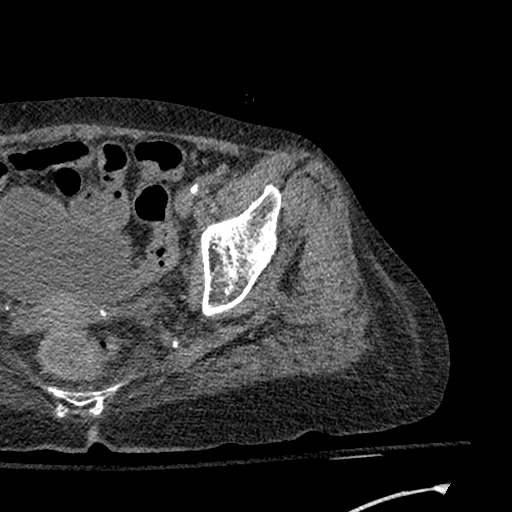
[im 340/389  soft-tissue]
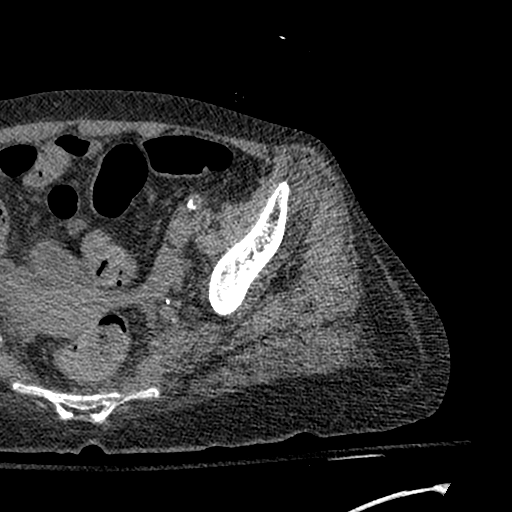
[im 372/389  soft-tissue]
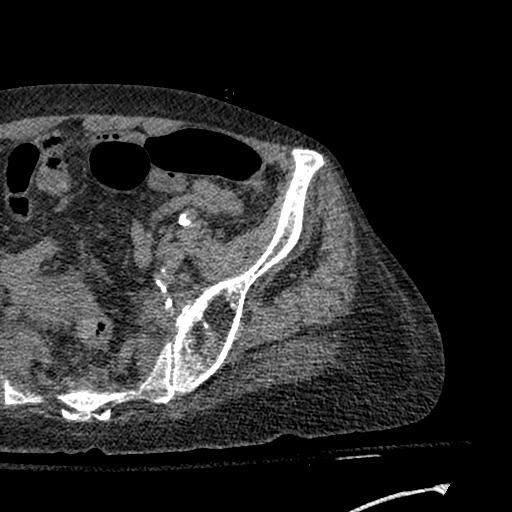

[17 of 46 positions shown; findings below may reference images not displayed]

FINDINGS: Bones/Joint/Cartilage

No acute fracture. No dislocation. Mild left hip joint space
narrowing. No appreciable hip joint effusion. Pubic symphysis and
visualized portion of the left sacroiliac joint are intact without
diastasis. Osseous structures appear slightly demineralized. No
suspicious lytic or sclerotic bone lesion.

Ligaments

Suboptimally assessed by CT.

Muscles and Tendons

No acute musculotendinous abnormality by CT.

Soft tissues

No soft tissue fluid collection or hematoma. No left inguinal
lymphadenopathy. Prominent atherosclerotic vascular calcifications.
IMPRESSION: 1. No acute fracture or dislocation of the left hip.
2. Mild left hip osteoarthritis.

## 2022-11-02 IMAGING — CT CT HEAD W/O CM
4 series · 17 of 47 positions shown, 19 images · non-contrast
Comparison: 01/28/2021

CLINICAL DATA: Encephalopathy

EXAM:
CT HEAD WITHOUT CONTRAST
TECHNIQUE: Contiguous axial images were obtained from the base of the skull
through the vertex without intravenous contrast.

[Series 3: head wo · axial · 0.45mm/px · z∈[+1010,+1130]mm · 7 of 34 slices shown, 9 images]
[im 5/34  brain]
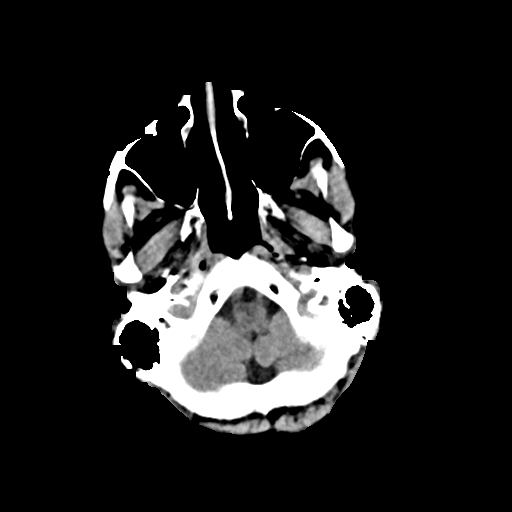
[im 5/34  bone]
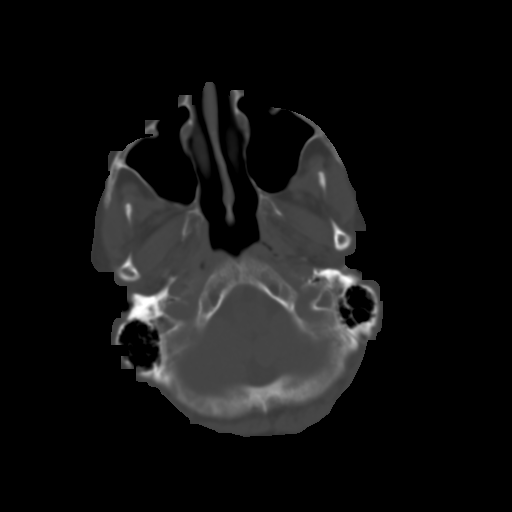
[im 9/34  brain]
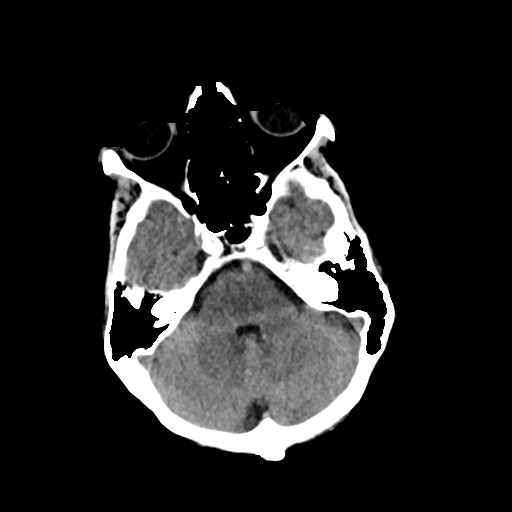
[im 13/34  brain]
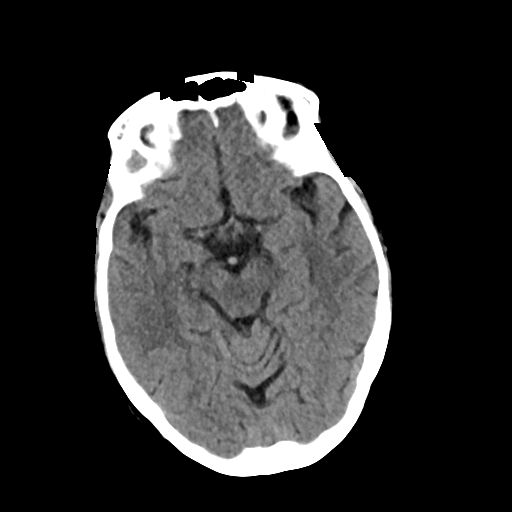
[im 17/34  brain]
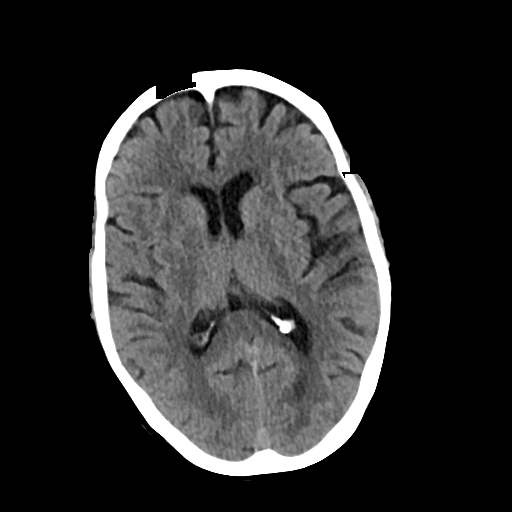
[im 21/34  brain]
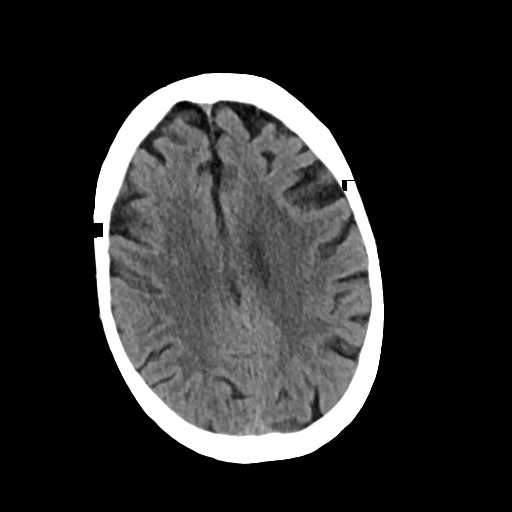
[im 21/34  bone]
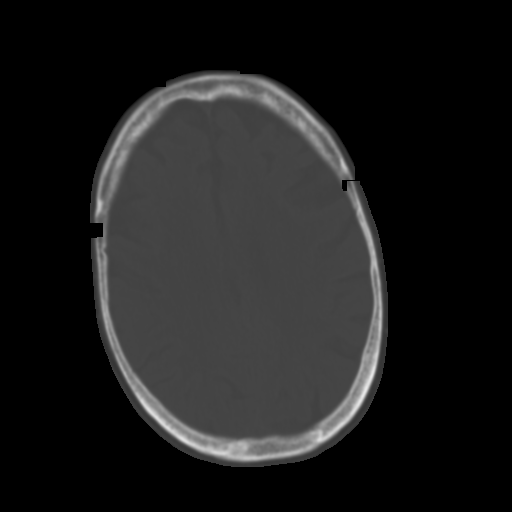
[im 25/34  brain]
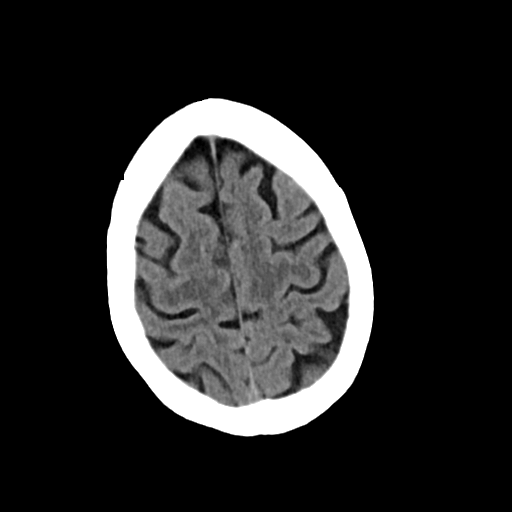
[im 29/34  brain]
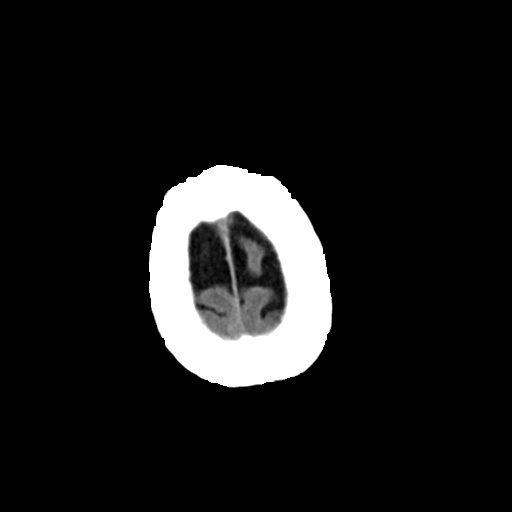

[Series 4: head bone · axial · 0.45mm/px · z∈[+1006,+1062]mm · 4 of 83 slices shown]
[im 9/83  bone]
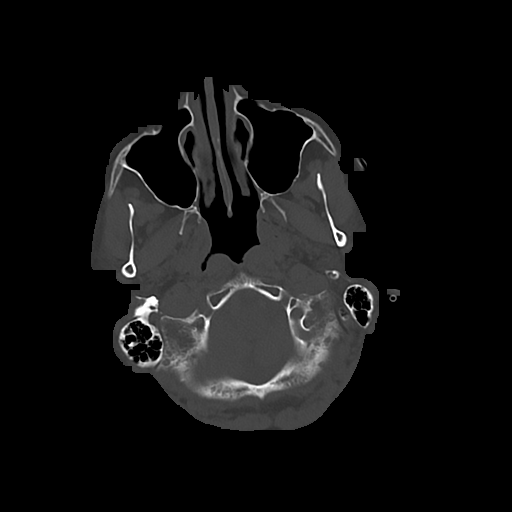
[im 17/83  bone]
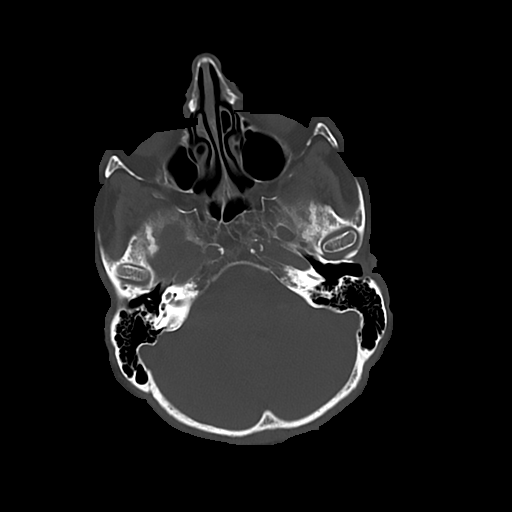
[im 25/83  bone]
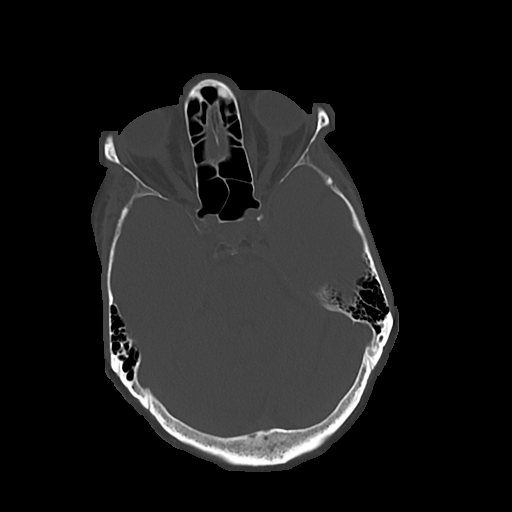
[im 37/83  bone]
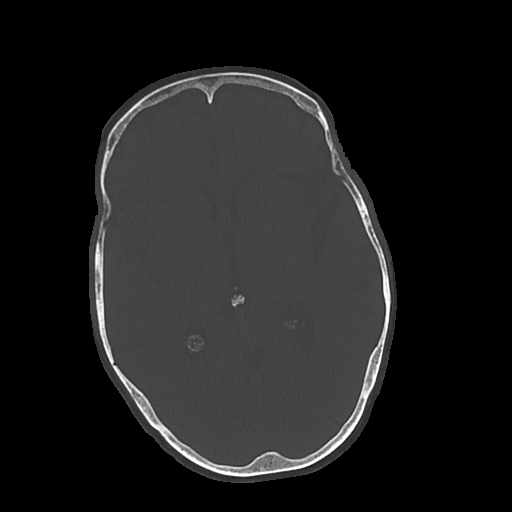

[Series 5: cor soft · coronal · 0.30mm/px · 3 of 73 slices shown]
[im 25/73  brain]
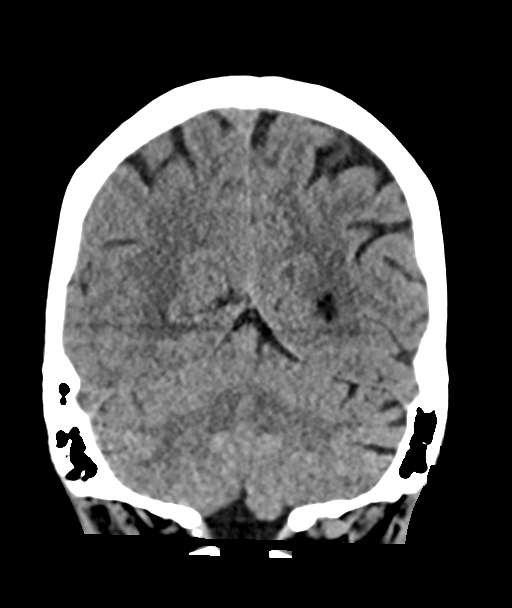
[im 33/73  brain]
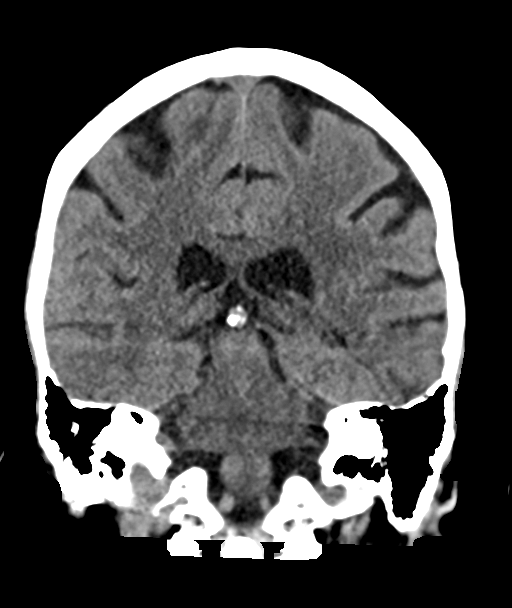
[im 41/73  brain]
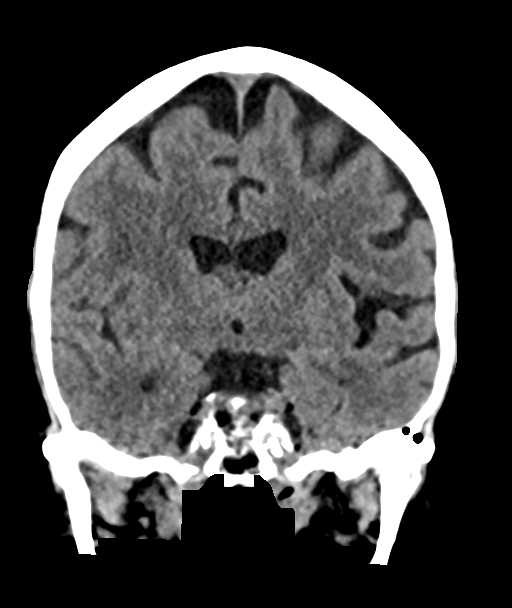

[Series 6: sag soft · sagittal · 0.39mm/px · 3 of 52 slices shown]
[im 18/52  brain]
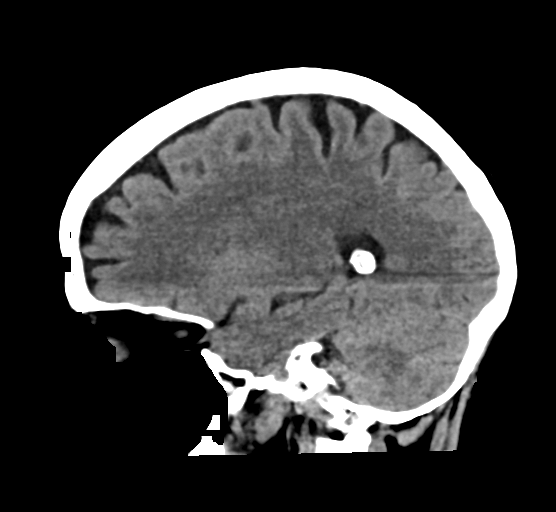
[im 26/52  brain]
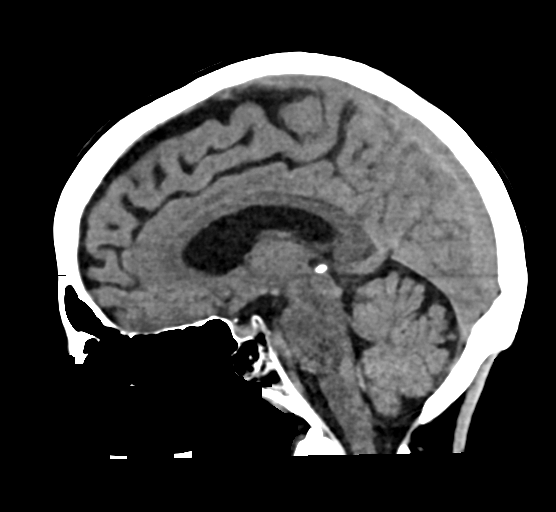
[im 35/52  brain]
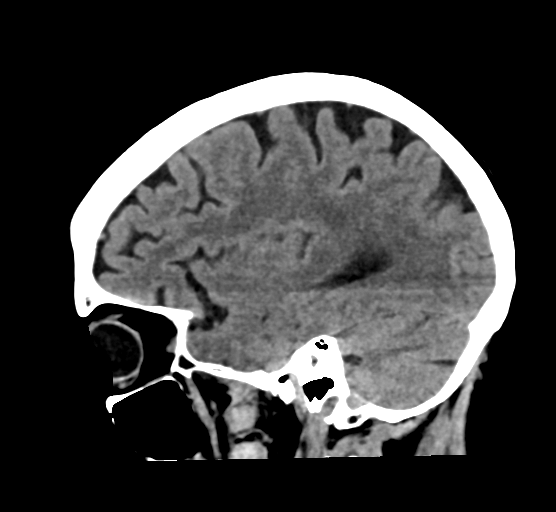

[17 of 47 positions shown; findings below may reference images not displayed]

FINDINGS: Brain: There is no mass, hemorrhage or extra-axial collection. The
size and configuration of the ventricles and extra-axial CSF spaces
are normal. There is hypoattenuation of the white matter, most
commonly indicating chronic small vessel disease.

Vascular: Atherosclerotic calcification of the internal carotid
arteries at the skull base. No abnormal hyperdensity of the major
intracranial arteries or dural venous sinuses.

Skull: The visualized skull base, calvarium and extracranial soft
tissues are normal.

Sinuses/Orbits: No fluid levels or advanced mucosal thickening of
the visualized paranasal sinuses. No mastoid or middle ear effusion.
The orbits are normal.
IMPRESSION: Chronic small vessel disease without acute intracranial abnormality.

## 2022-11-08 IMAGING — MR MR FEMUR*L* W/O CM
7 series · 40 of 40 positions shown · non-contrast
Comparison: CT left hip 02/11/2021

CLINICAL DATA: Posterior left thigh pain. Limping. Fall on
02/08/2021

EXAM:
MR OF THE LEFT FEMUR WITHOUT CONTRAST
TECHNIQUE: Multiplanar, multisequence MR imaging of the left femur was
performed. No intravenous contrast was administered.

[Series 16: ax t1_comp · axial · left · 5.0mm · 0.70mm/px · z∈[-439,+56]mm · 10 of 84 slices shown]
[im 1/84]
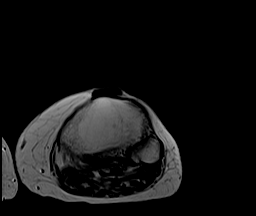
[im 10/84]
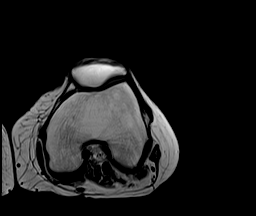
[im 19/84]
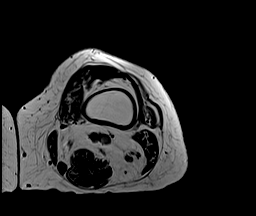
[im 28/84]
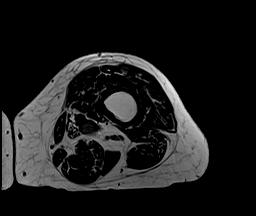
[im 37/84]
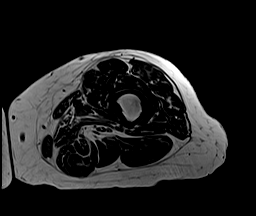
[im 47/84]
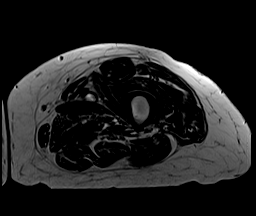
[im 56/84]
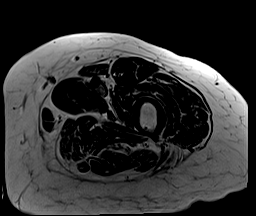
[im 65/84]
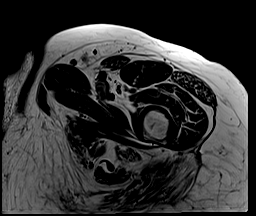
[im 74/84]
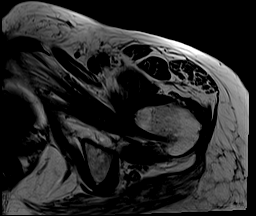
[im 84/84]
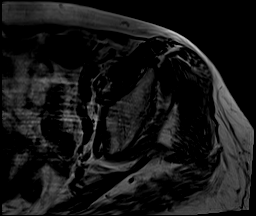

[Series 20: composed cor stir_comp_filt · coronal · left · 6.0mm · 1.17mm/px · 3 of 26 slices shown]
[im 1/26]
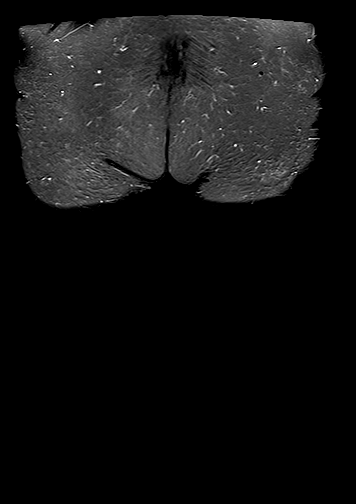
[im 13/26]
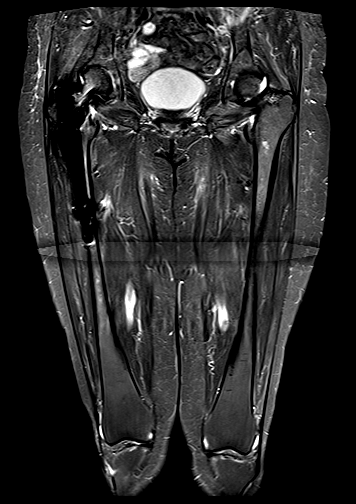
[im 26/26]
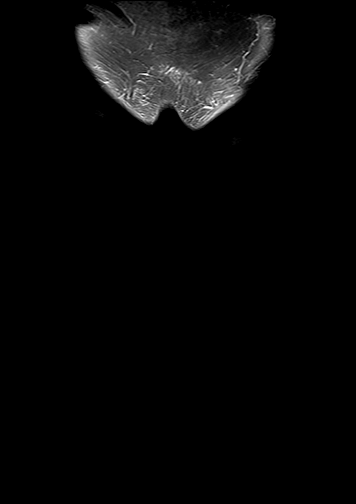

[Series 24: composed cor t1_comp_filt · coronal · left · 6.0mm · 1.17mm/px · 3 of 26 slices shown]
[im 1/26]
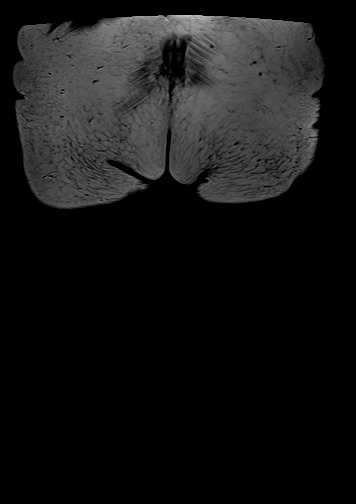
[im 13/26]
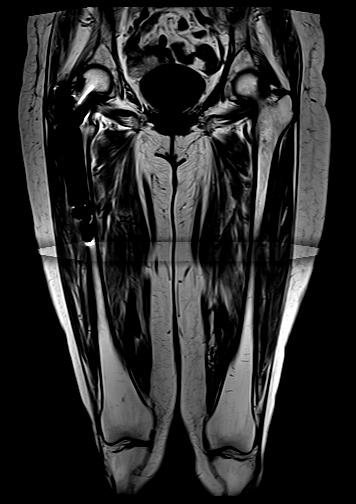
[im 26/26]
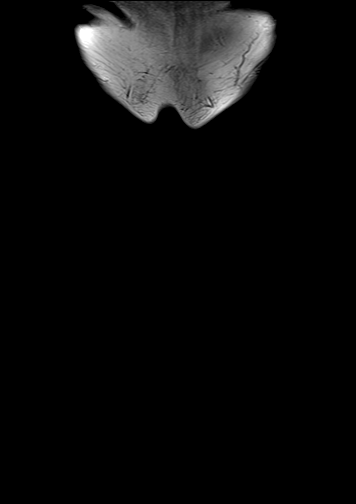

[Series 27: T2 · axial · left · 5.0mm · 0.87mm/px · z∈[-439,-194]mm · 5 of 42 slices shown (1 of 3)]
[im 1/42]
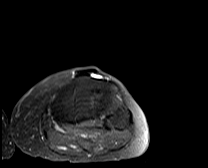
[im 11/42]
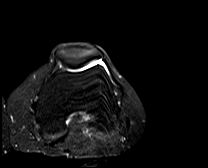
[im 21/42]
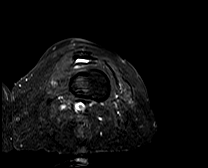
[im 31/42]
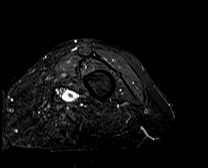
[im 42/42]
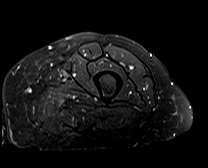

[Series 27: T2 · axial · left · 5.0mm · 0.87mm/px · z∈[-189,+56]mm · 5 of 42 slices shown (2 of 3)]
[im 1/42]
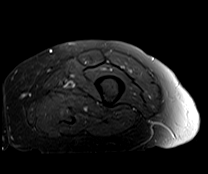
[im 11/42]
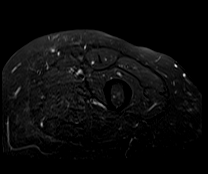
[im 21/42]
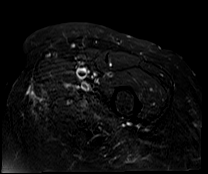
[im 31/42]
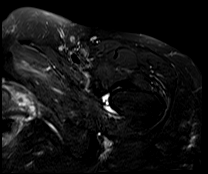
[im 42/42]
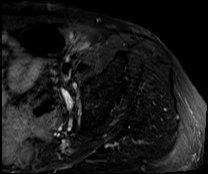

[Series 30: T2 · sagittal · left · 5.0mm · 1.15mm/px · 4 of 32 slices shown (3 of 3)]
[im 1/32]
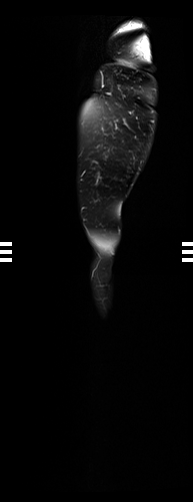
[im 11/32]
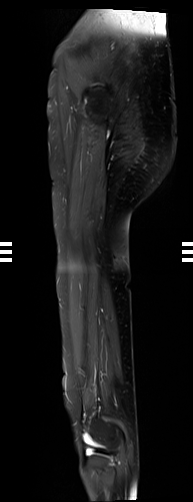
[im 21/32]
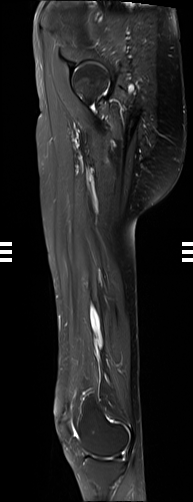
[im 32/32]
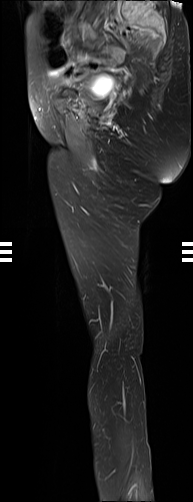

[Series 33: T1 fat-sat · axial · left · 5.0mm · 0.87mm/px · z∈[-440,+55]mm · 10 of 84 slices shown]
[im 1/84]
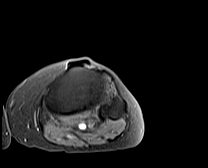
[im 10/84]
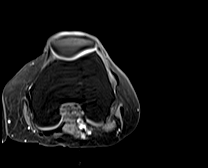
[im 19/84]
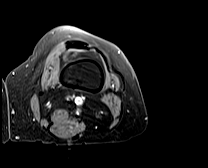
[im 28/84]
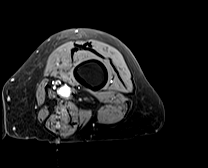
[im 37/84]
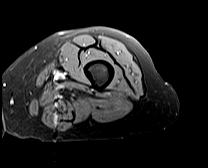
[im 47/84]
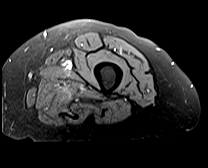
[im 56/84]
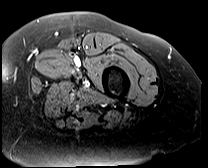
[im 65/84]
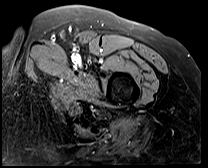
[im 74/84]
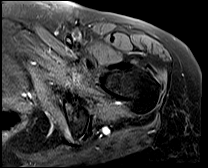
[im 84/84]
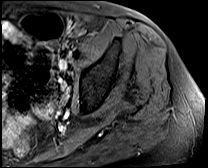

[40 of 40 positions shown; findings below may reference images not displayed]

FINDINGS: Technical note: Patient was unable to tolerate further imaging and
the postcontrast portion of the exam was not performed.

Bones/Joint/Cartilage

Partially visualized at the edge of the field of view on full-field
coronal images are acute to subacute bilateral sacral alar fractures
(series 20, images 5-9) with surrounding bone marrow edema. The
visualized SI joints are intact without diastasis. No SI joint
effusion. Pubic symphysis intact.

No acute fracture or dislocation of the left femur. Negative for
left femoral head avascular necrosis. No significant left hip joint
effusion. Mild left hip osteoarthritis. Prior ORIF of the proximal
right femur without evidence of hardware complication. No suspicious
marrow replacing bone lesion.

Ligaments

Intact.

Muscles and Tendons

Tendinous structures appear intact without significant tendinosis or
tear. No bursal fluid collections. Mild intramuscular edema within
the left adductor compartment including the adductor longus and
brevis muscles, which may reflect a mild muscle strain. No evidence
of muscle tear or intramuscular fluid collection.

Soft tissues

No soft tissue edema or fluid collection. No left inguinal
lymphadenopathy.
IMPRESSION: 1. Acute-to-subacute bilateral sacral alar fractures.
2. Mild intramuscular edema within the left adductor compartment,
which may reflect a mild muscle strain.
3. Mild left hip osteoarthritis.

## 2023-01-24 IMAGING — CT CT ANGIO CHEST
2 of 6 series · 18 of 36 positions shown · IV contrast (omnipaque)
Comparison: 10/30/2020.

CLINICAL DATA: Short of breath and chest pain over the last week.

EXAM:
CT ANGIOGRAPHY CHEST WITH CONTRAST
TECHNIQUE: Multidetector CT imaging of the chest was performed using the
standard protocol during bolus administration of intravenous
contrast. Multiplanar CT image reconstructions and MIPs were
obtained to evaluate the vascular anatomy.
CONTRAST:  75mL OMNIPAQUE IOHEXOL 350 MG/ML SOLN

[Series 7: pe thins · axial · 0.70mm/px · z∈[+1162,+1412]mm · 17 of 398 slices shown]
[im 20/398  lung]
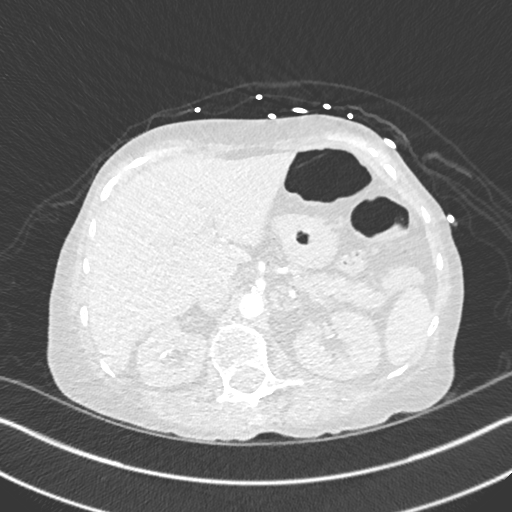
[im 40/398  mediastinal]
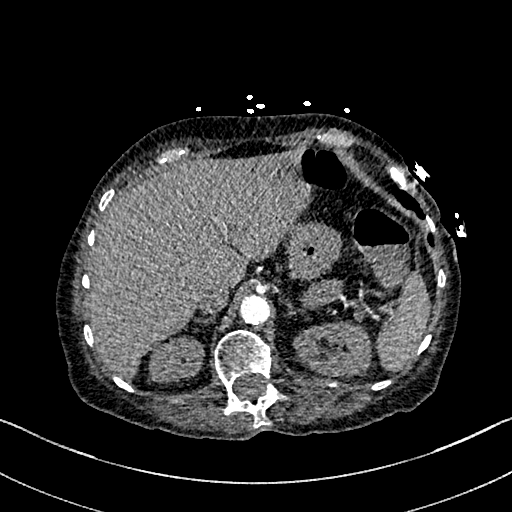
[im 60/398  lung]
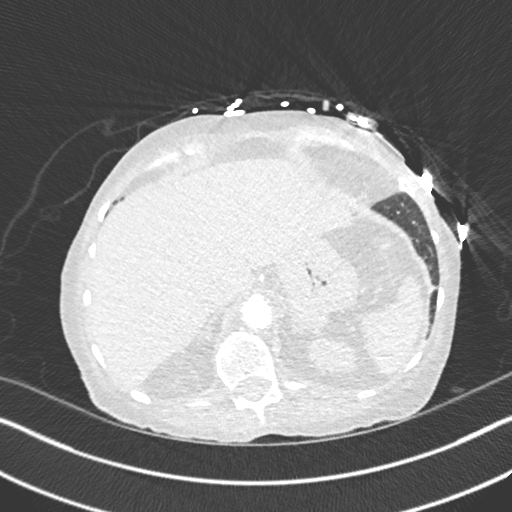
[im 80/398  mediastinal]
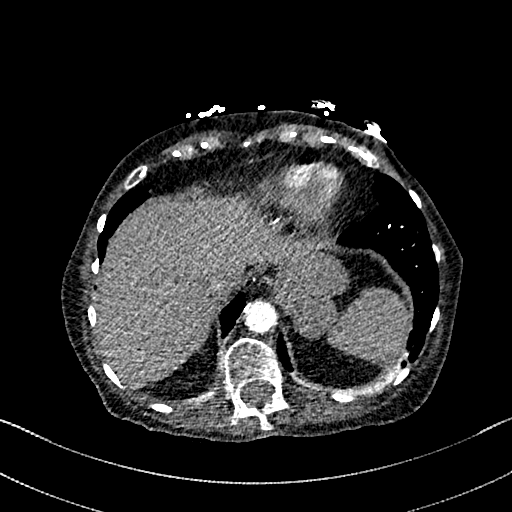
[im 120/398  lung]
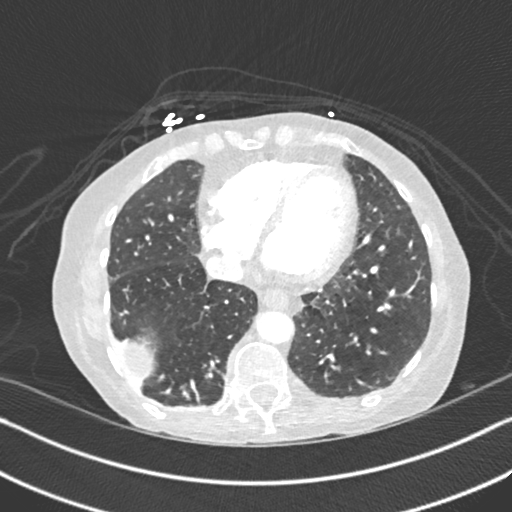
[im 139/398  mediastinal]
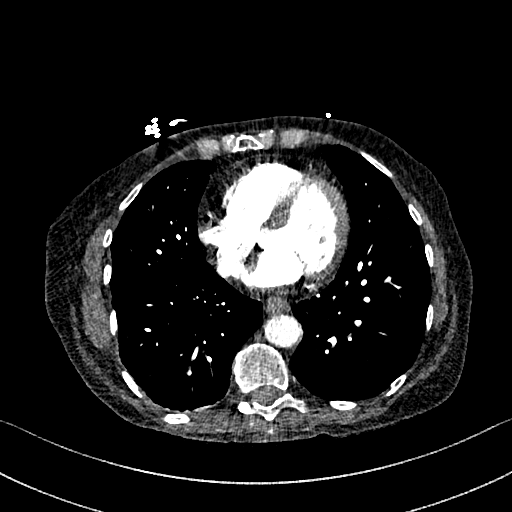
[im 159/398  lung]
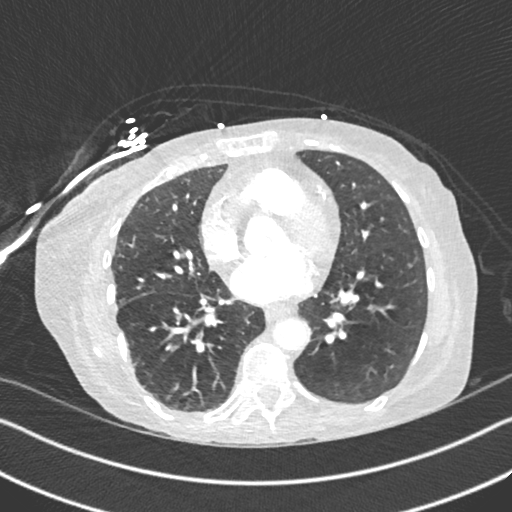
[im 179/398  mediastinal]
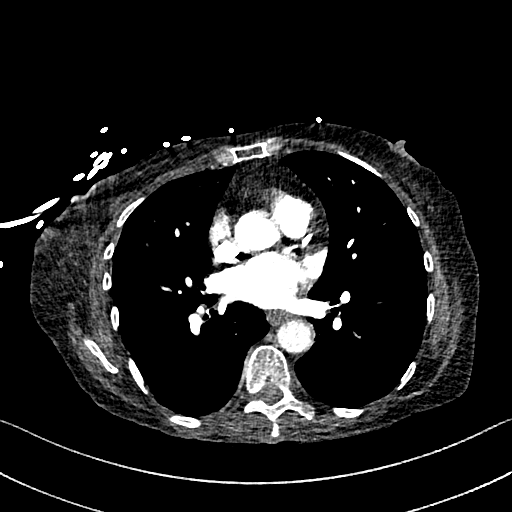
[im 199/398  lung]
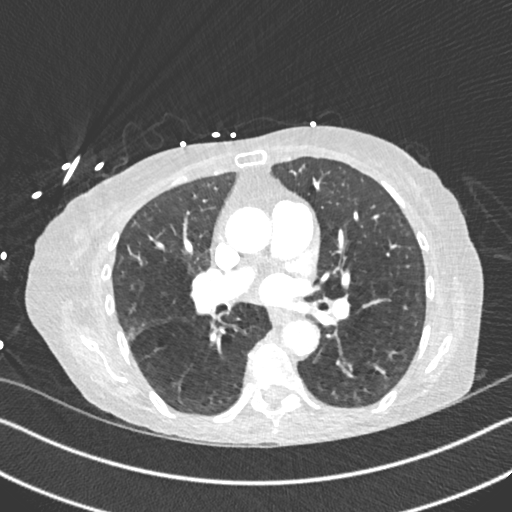
[im 219/398  mediastinal]
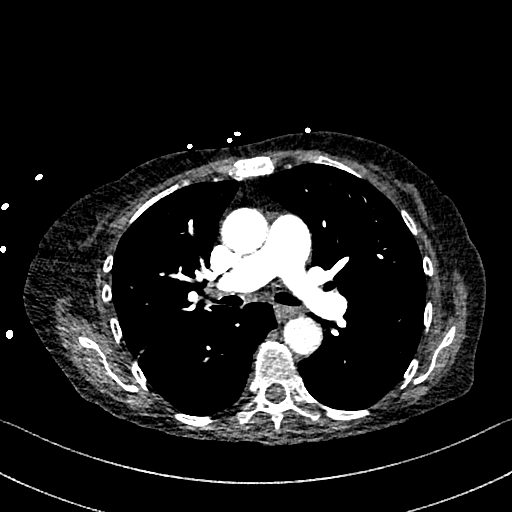
[im 239/398  lung]
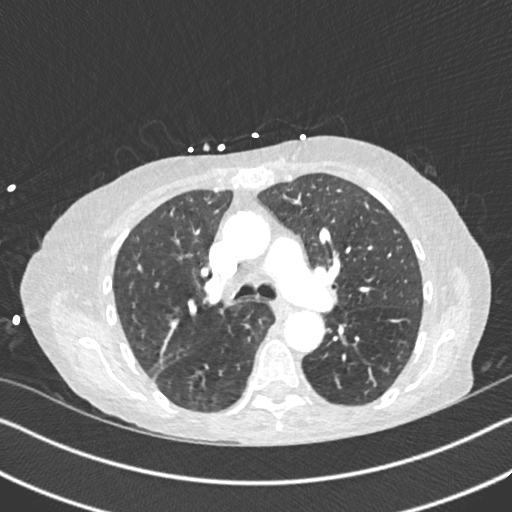
[im 259/398  mediastinal]
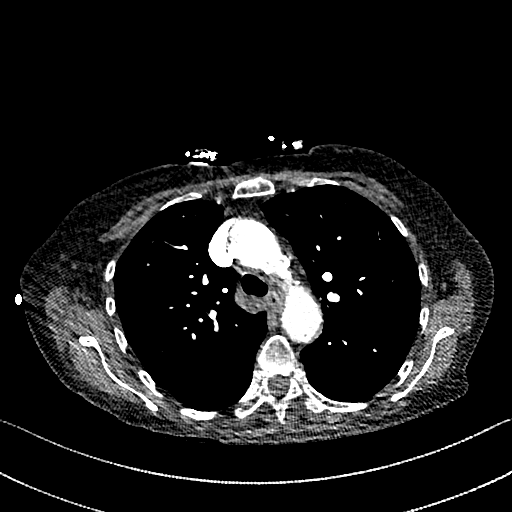
[im 278/398  lung]
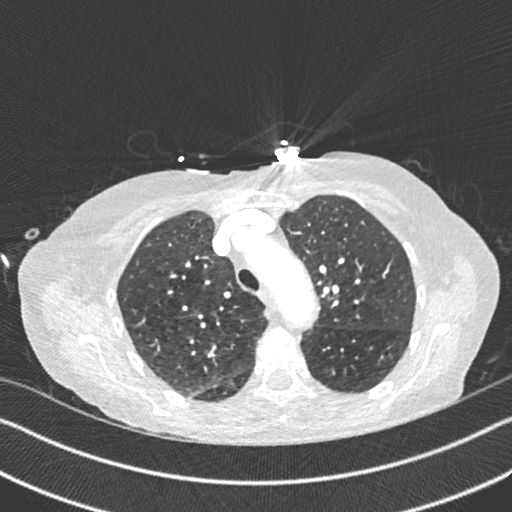
[im 318/398  mediastinal]
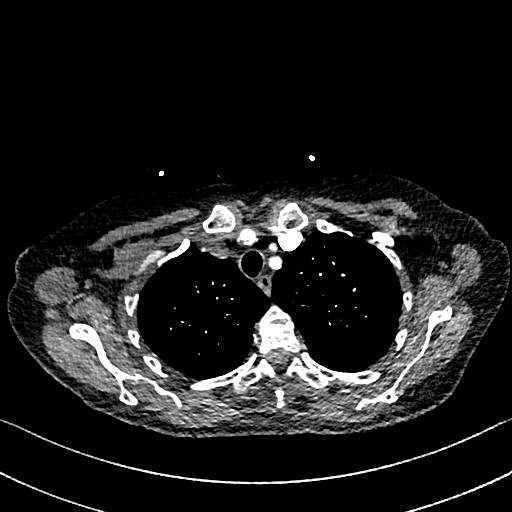
[im 338/398  lung]
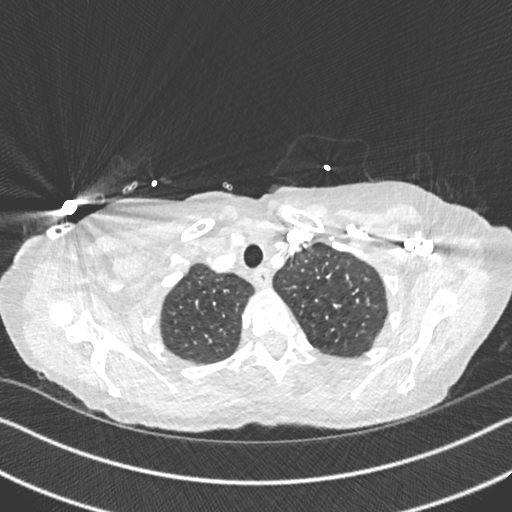
[im 358/398  mediastinal]
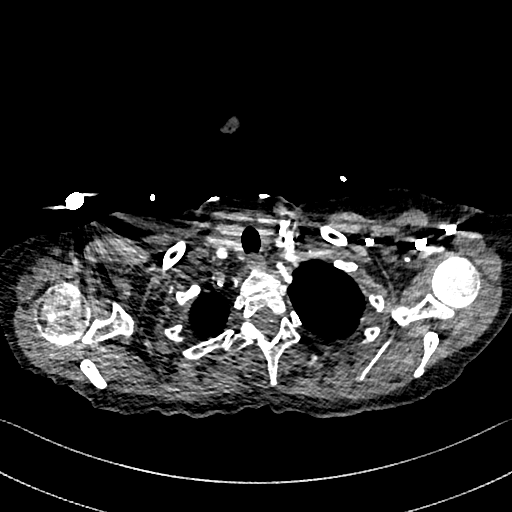
[im 378/398  lung]
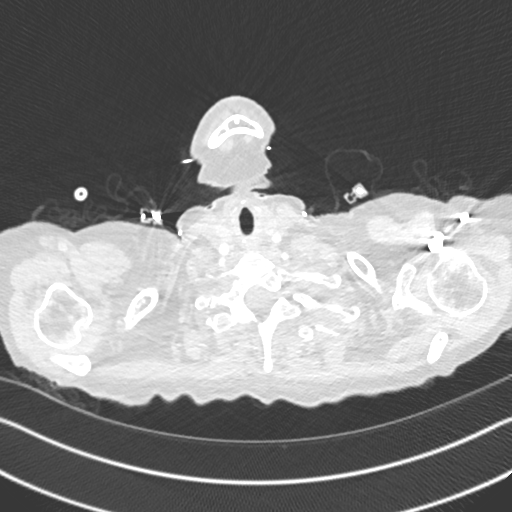

[Series 8: pe 2mm cor · coronal · 0.53mm/px · 1 of 122 slices shown]
[im 61/122  mediastinal]
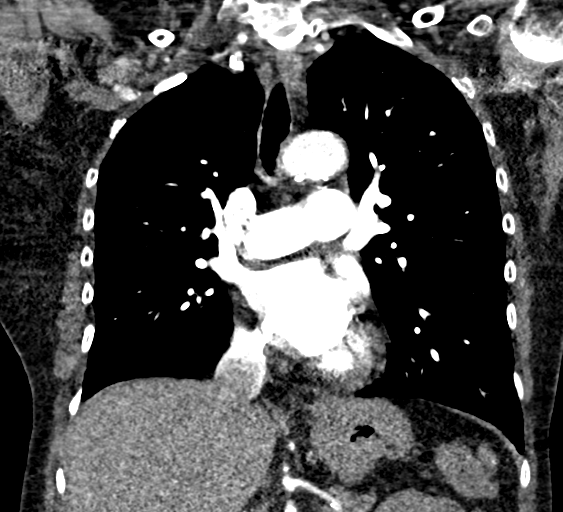

[18 of 36 positions shown; findings below may reference images not displayed]

FINDINGS: Cardiovascular: Satisfactory opacification of the pulmonary arteries
to the segmental level. No evidence of pulmonary embolism.

Heart normal in size. Three-vessel coronary artery calcifications.
No pericardial effusion. Great vessels are normal in caliber. No
aortic dissection. Aortic atherosclerosis. Arch branch vessels are
widely patent.

Mediastinum/Nodes: No neck base, mediastinal or hilar masses or
enlarged lymph nodes. Trachea and esophagus are unremarkable.

Lungs/Pleura: Moderate emphysema. Minor dependent atelectasis in the
lower lobes, greater on the right. There are areas of bronchial wall
thickening with some partially filled with mucus, most evident in
the lower lobes. No mass or suspicious nodule. No pleural effusion
or pneumothorax.

Upper Abdomen: No acute findings.

Musculoskeletal: Mild compression fracture of T4 new since the prior
CT. Previously seen fractures of the sternum have healed. No other
fractures. No bone lesions. No chest wall mass.

Review of the MIP images confirms the above findings.
IMPRESSION: 1. No evidence of a pulmonary embolism.
2. No acute findings in the lungs.
3. Moderate emphysema. Mild areas of bronchial wall thickening with
some partly mucous filled consistent with chronic bronchitis.
4. Mild compression fracture of T4, of unclear chronicity, but new
since the CT dated 10/30/2020.

Aortic Atherosclerosis (H17E2-[id number].C) and Emphysema ([id number].R).

## 2023-01-24 IMAGING — DX DG CHEST 1V PORT
1 series · 1 of 1 positions shown · non-contrast
Comparison: February 09, 2021 and July 04, 2020

CLINICAL DATA: Shortness of breath

EXAM:
PORTABLE CHEST 1 VIEW

[chest]
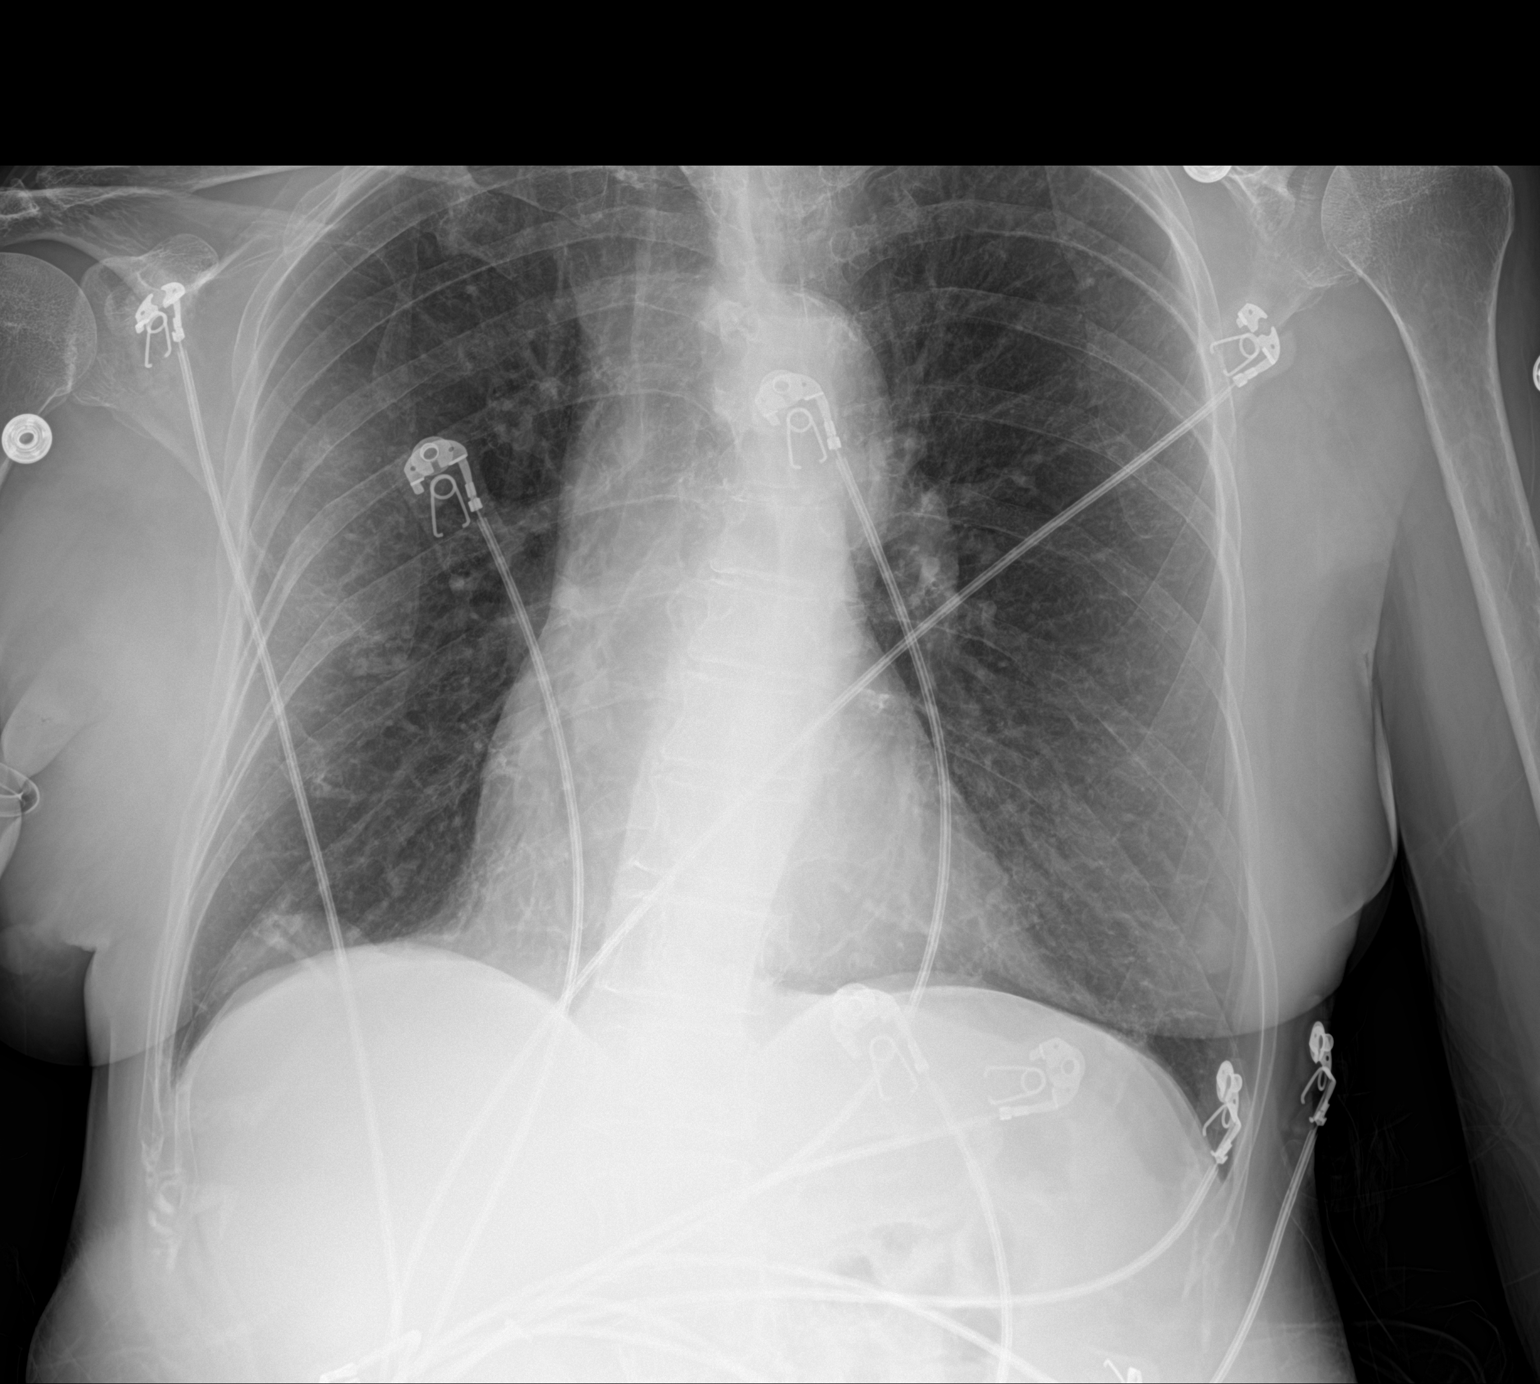

[1 of 1 positions shown; findings below may reference images not displayed]

FINDINGS: Subtle increased opacity in the right mid lung is stable and may
represent chronic scarring. Lungs elsewhere are clear. Heart size
and pulmonary vascularity normal. No adenopathy. There is aortic
atherosclerosis as well as calcification in right subclavian artery
region. There is upper thoracic levoscoliosis.
IMPRESSION: Stable increased opacity right mid lung which may represent
scarring. No frank edema or consolidation evident. Stable cardiac
silhouette. Aortic Atherosclerosis ([id number]-SV9.9).

## 2023-02-05 IMAGING — CT CT ABD-PELV W/O CM
4 series · 13 of 46 positions shown, 18 images · non-contrast
Comparison: Radiograph earlier today.  CT 06/11/2020

CLINICAL DATA: Left upper quadrant abdominal pain.  Nausea.

EXAM:
CT ABDOMEN AND PELVIS WITHOUT CONTRAST
TECHNIQUE: Multidetector CT imaging of the abdomen and pelvis was performed
following the standard protocol without IV contrast.

[Series 3: ap without · axial · non-contrast · 0.73mm/px · z∈[-382,-92]mm · 7 of 78 slices shown, 12 images]
[im 10/78  soft-tissue]
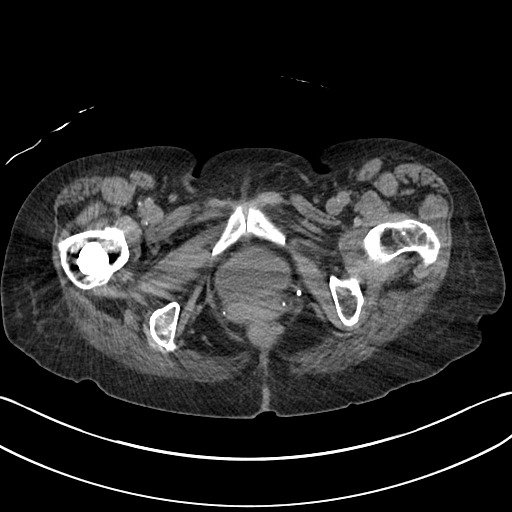
[im 10/78  bone]
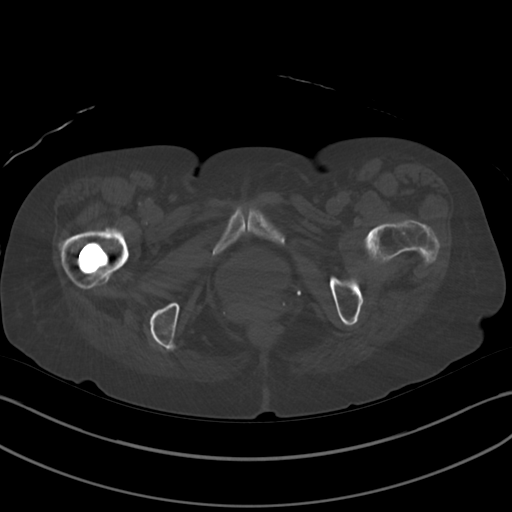
[im 20/78  soft-tissue]
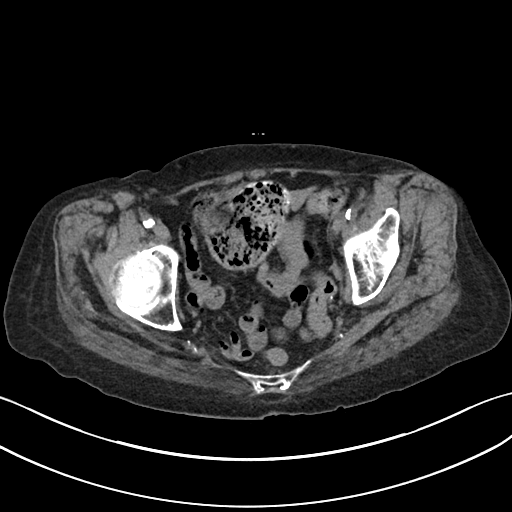
[im 29/78  soft-tissue]
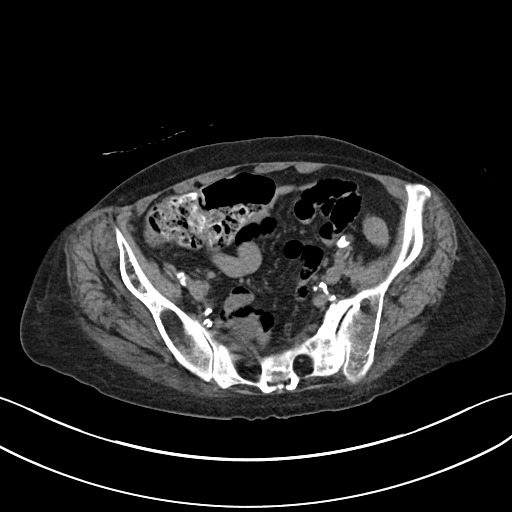
[im 39/78  soft-tissue]
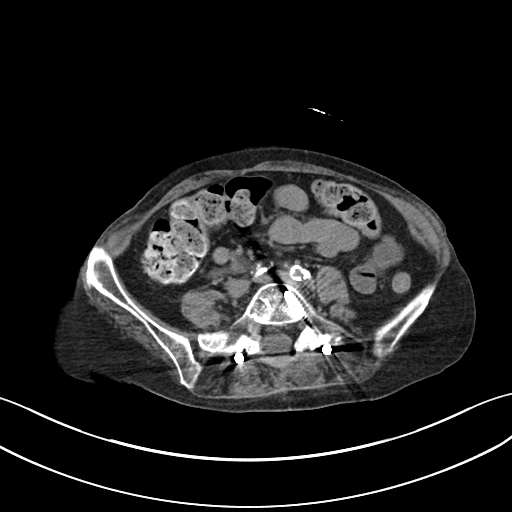
[im 39/78  lung]
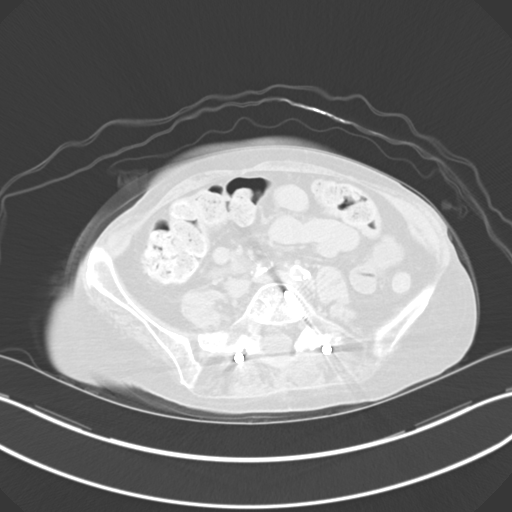
[im 49/78  soft-tissue]
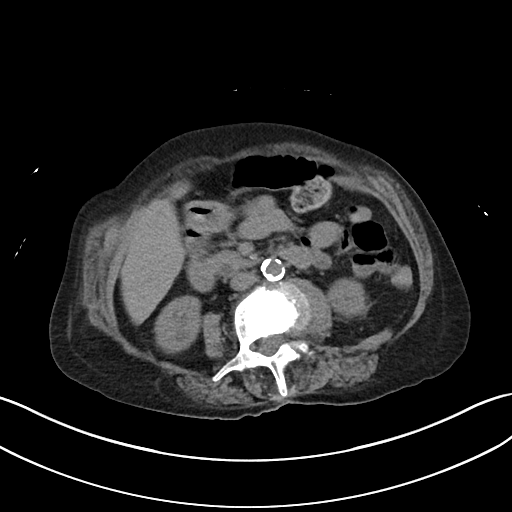
[im 49/78  lung]
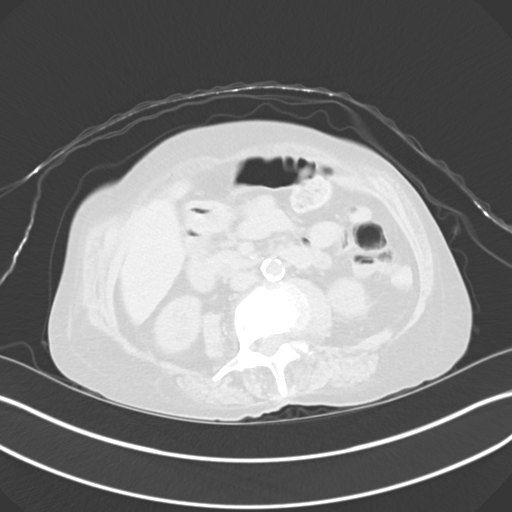
[im 58/78  soft-tissue]
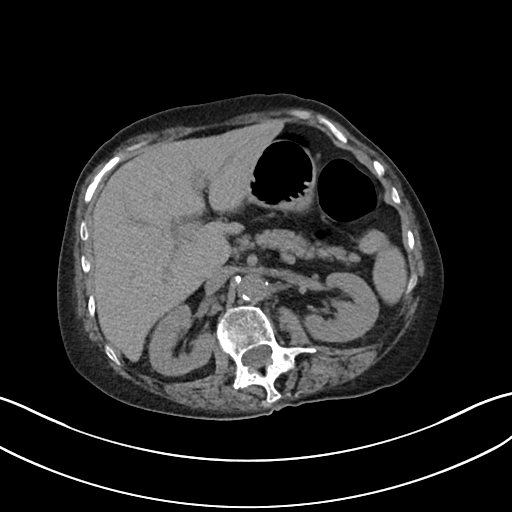
[im 58/78  lung]
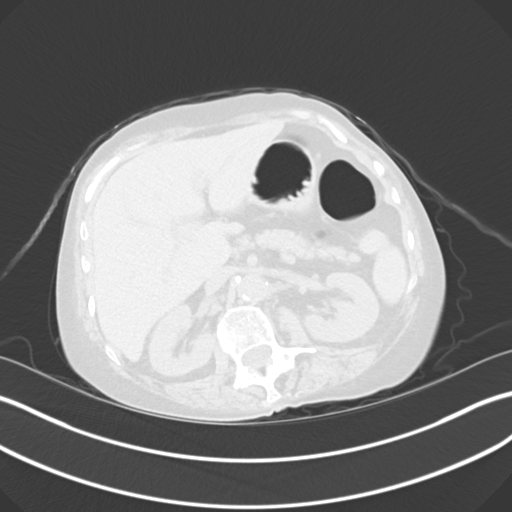
[im 68/78  soft-tissue]
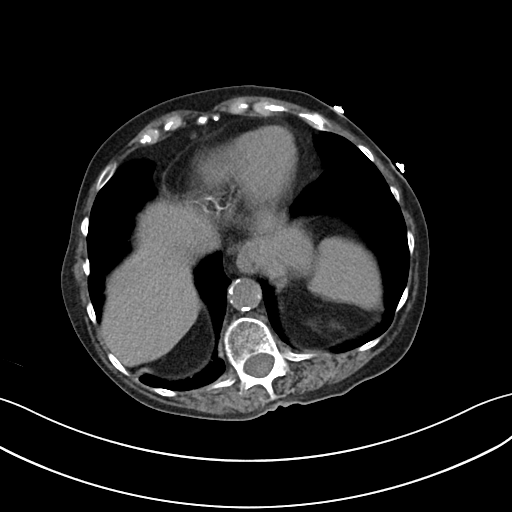
[im 68/78  lung]
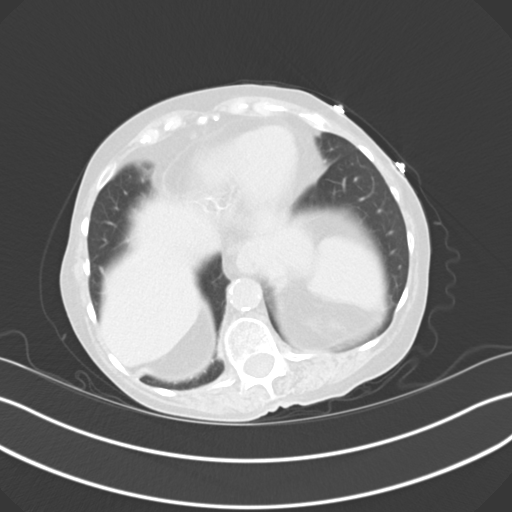

[Series 5: lungs · axial · 0.73mm/px · z∈[-394,-358]mm · 2 of 194 slices shown]
[im 18/194  soft-tissue]
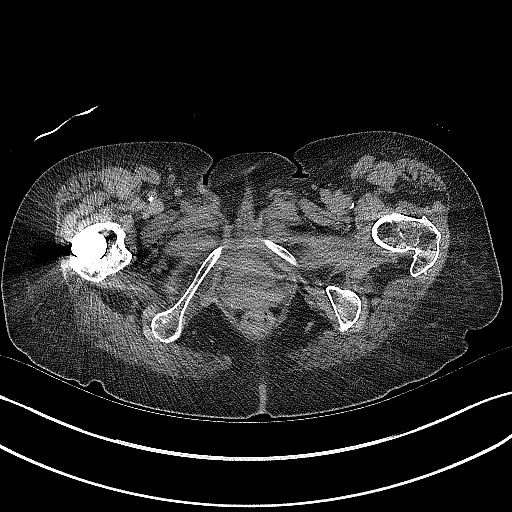
[im 36/194  soft-tissue]
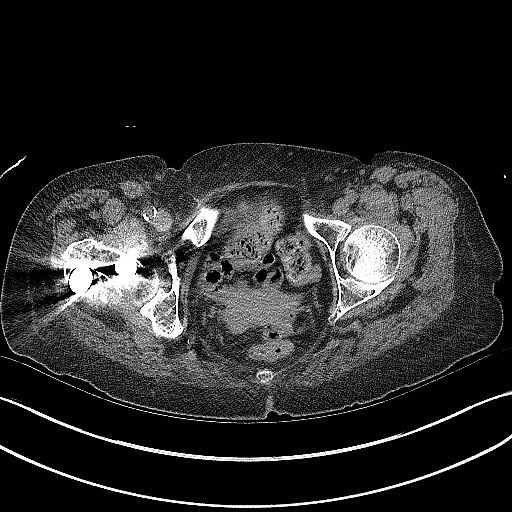

[Series 6: cor · coronal · 0.79mm/px · 3 of 90 slices shown]
[im 30/90  soft-tissue]
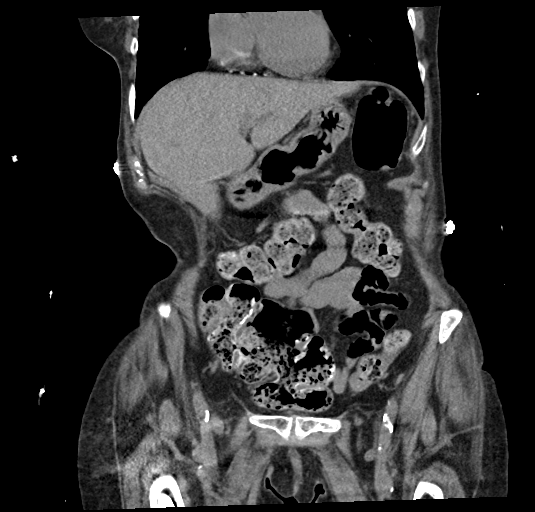
[im 40/90  soft-tissue]
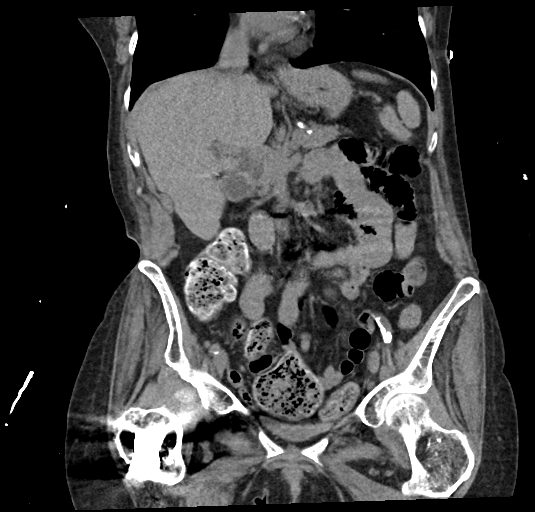
[im 50/90  soft-tissue]
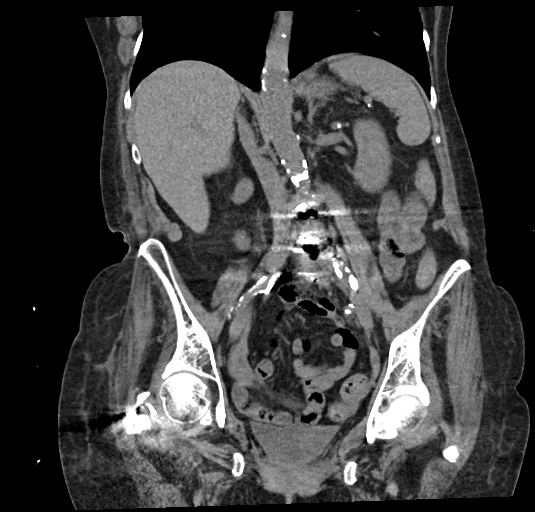

[Series 7: sag · sagittal · 0.57mm/px · 1 of 124 slices shown]
[im 42/124  soft-tissue]
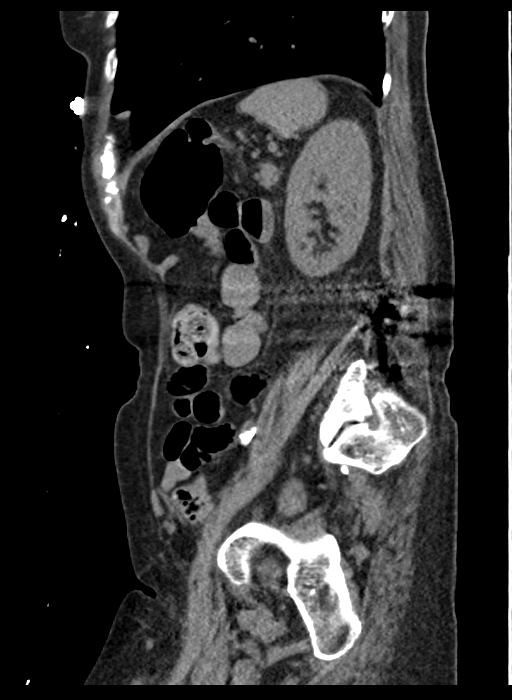

[13 of 46 positions shown; findings below may reference images not displayed]

FINDINGS: Lower chest: Emphysema with occasional areas of right lower lobe
mucous plugging. No focal airspace disease or pleural effusion.
Normal heart size with coronary artery calcifications.

Hepatobiliary: Tiny low-density lesion in the left hepatic lobe
unchanged from prior exam. No new focal lesion. Clips in the
gallbladder fossa postcholecystectomy. No biliary dilatation.

Pancreas: No ductal dilatation or inflammation.

Spleen: Normal in size without focal abnormality.

Adrenals/Urinary Tract: Normal adrenal glands. No hydronephrosis or
perinephric edema. Calcifications at the left renal hila appear
vascular rather than nonobstructing stones. Suspected 4 mm
nonobstructing stone in the upper right kidney. Decompressed ureters
without ureteral calculi. Minimally distended urinary bladder. No
bladder stone no bladder wall thickening. There is mild pelvic floor
descent with cystocele.

Stomach/Bowel: No bowel obstruction or inflammation. Small hiatal
hernia. Moderate stool in the colon, primarily right colon. Low
lying cecum in the pelvis. Appendix surgically absent per history.
Mild colonic redundancy. No colonic wall thickening or pericolonic
edema.

Vascular/Lymphatic: Advanced aortic and branch atherosclerosis. No
aortic aneurysm. No bulky abdominopelvic adenopathy.

Reproductive: Atrophic uterus, normal for age.  No adnexal mass.

Other: No ascites, free air, or focal fluid collection. No abdominal
wall hernia.

Musculoskeletal: L4-S1 posterior fusion. L3-L4 degenerative disc
disease. Surgical hardware in the right proximal femur. No acute
osseous abnormalities are seen.
IMPRESSION: 1. No acute abnormality or explanation for abdominal pain.
2. Suspected 4 mm nonobstructing stone in the upper right kidney. No
hydronephrosis or obstructive uropathy.
3. Mild pelvic floor descent with cystocele.

Aortic Atherosclerosis (BSCQ3-577.7) and Emphysema ([id number].Y).

## 2023-02-05 IMAGING — DX DG ABDOMEN ACUTE W/ 1V CHEST
3 series · 3 of 3 positions shown · non-contrast
Comparison: CT 06/11/2020

CLINICAL DATA: Epigastric pain and dysuria.

EXAM:
DG ABDOMEN ACUTE WITH 1 VIEW CHEST

[abdomen erect]
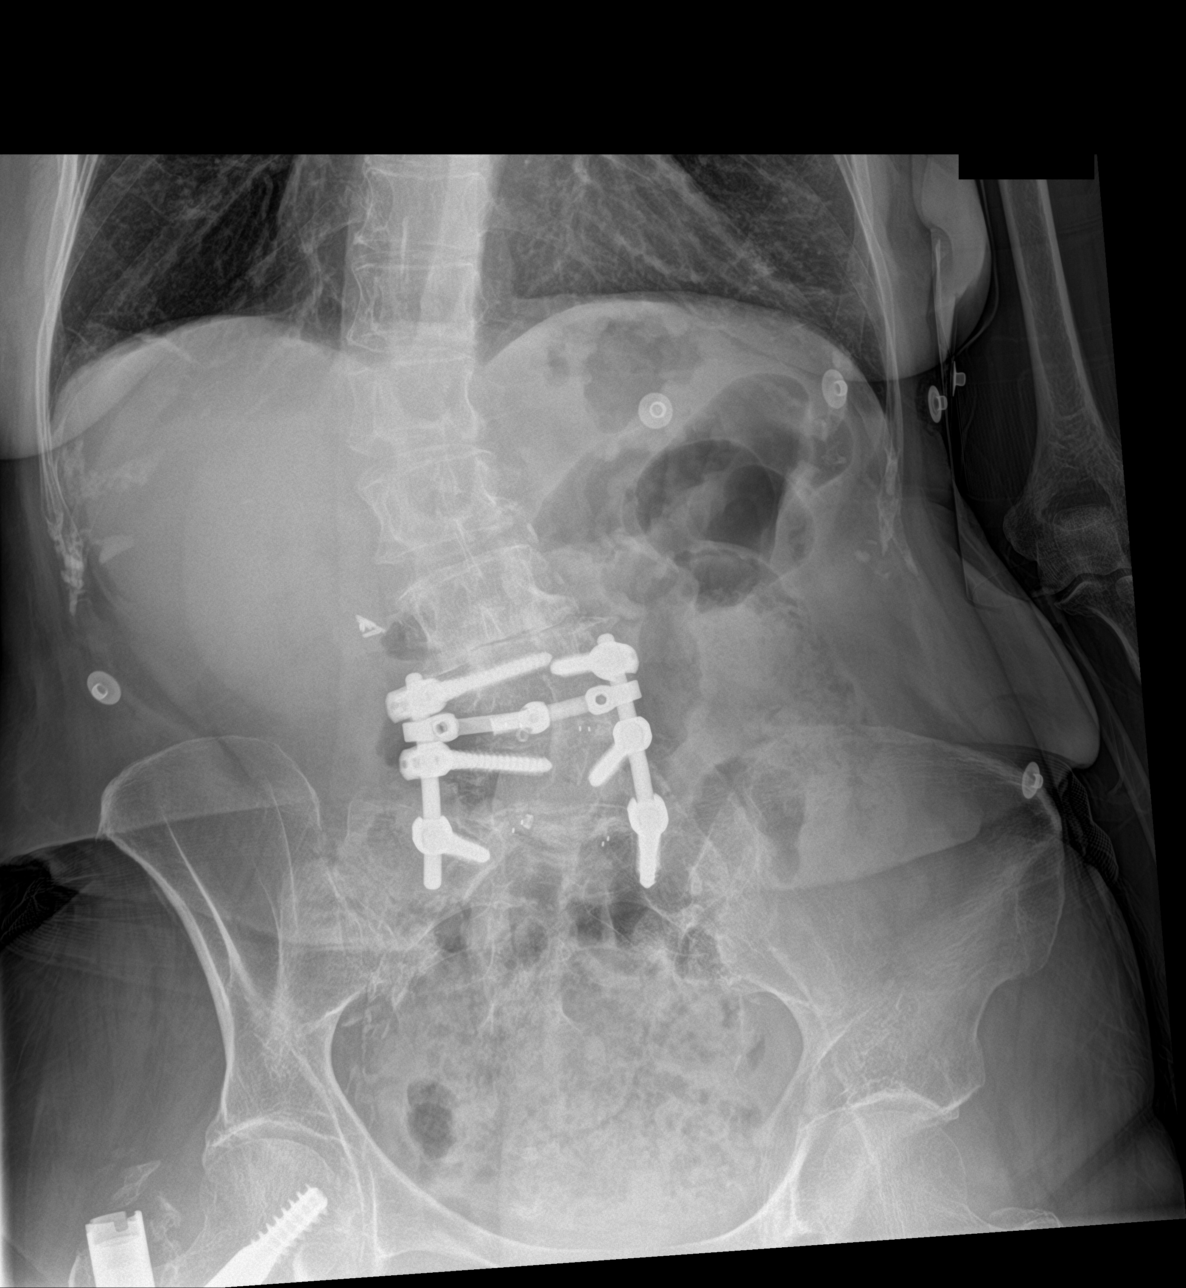

[abdomen supine]
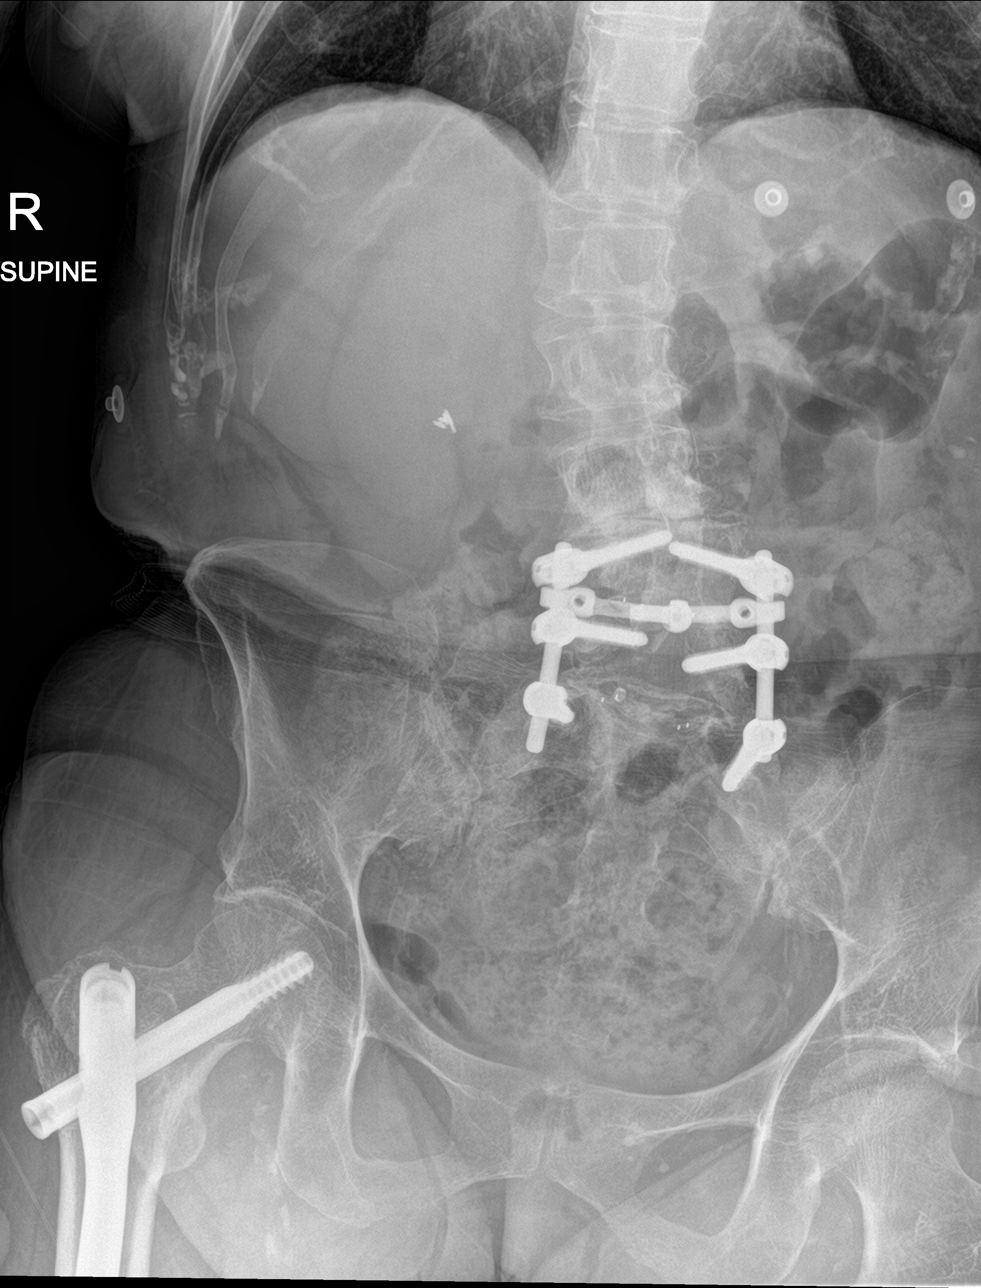

[chest pa]
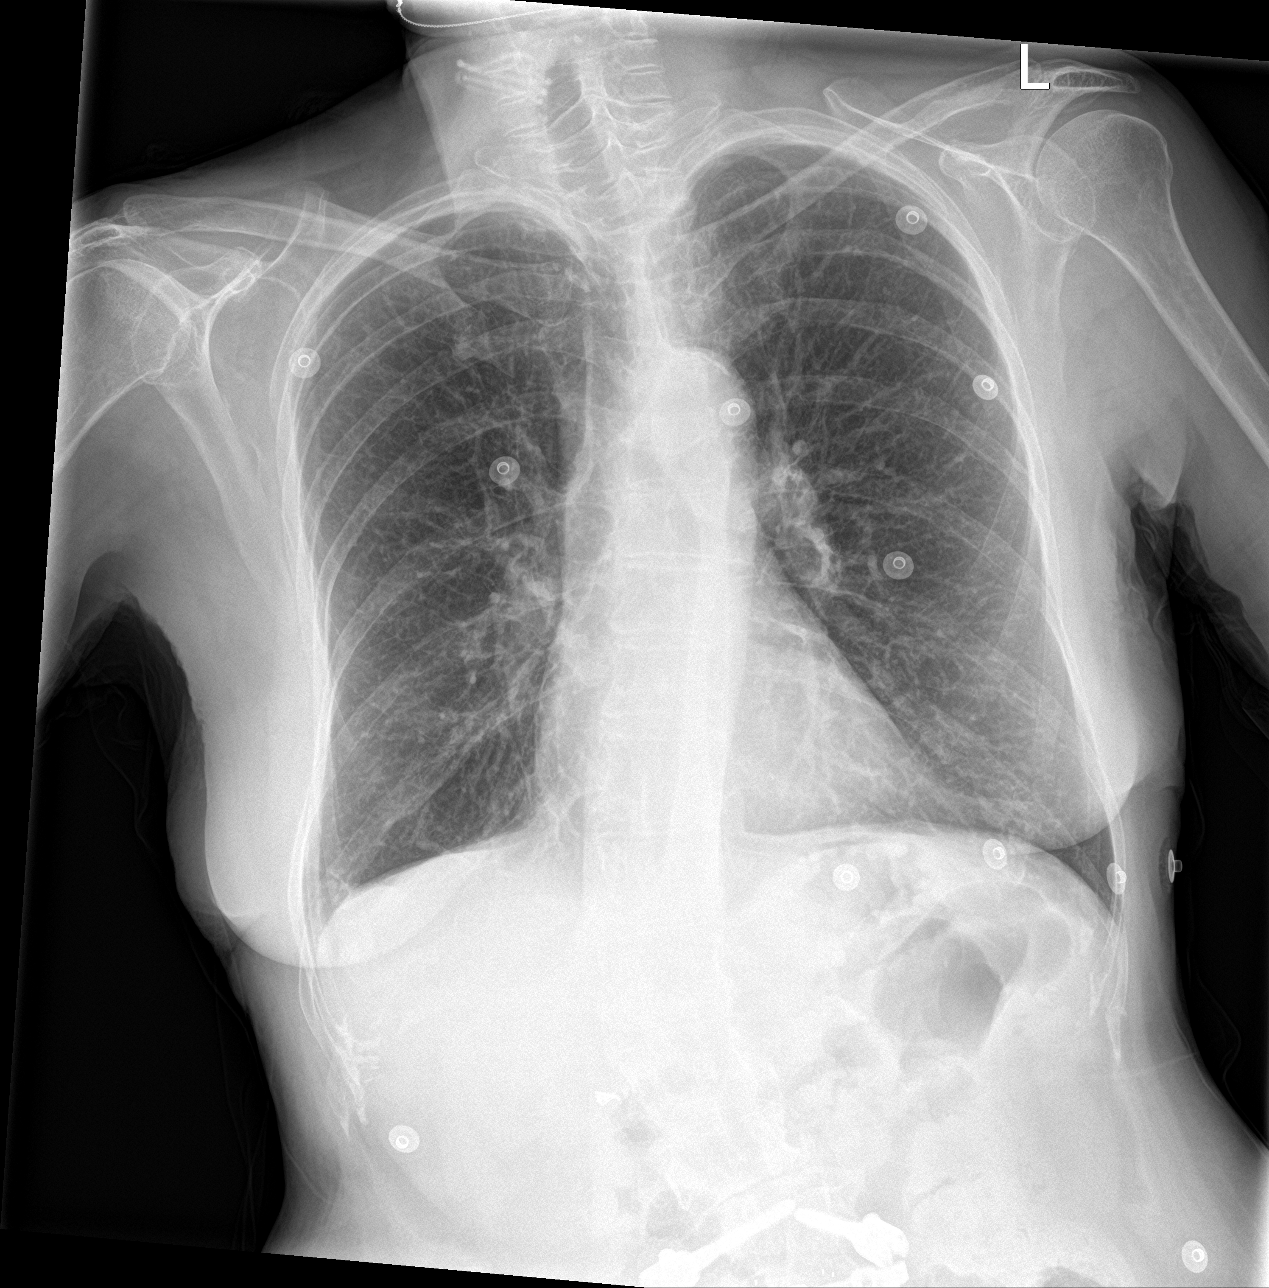

[3 of 3 positions shown; findings below may reference images not displayed]

FINDINGS: Heart is normal in size. No acute airspace disease. No pulmonary
edema or pleural effusion.

No free intra-abdominal air. No bowel dilatation to suggest
obstruction. Small volume of stool in the colon. Cholecystectomy
clips in the right upper quadrant. No visualized radiopaque calculi.
Surgical hardware in the lumbosacral spine. Surgical hardware in the
right proximal femur. Bones appear under mineralized.
IMPRESSION: Negative abdominal radiographs. No acute cardiopulmonary disease.

## 2023-02-12 IMAGING — MR MR THORACIC SPINE W/O CM
4 of 7 series · 21 of 48 positions shown · non-contrast
Comparison: Chest radiograph May 24, 2021 and chest CT May 05, 2021.

CLINICAL DATA: Mid back pain.

EXAM:
MRI THORACIC SPINE WITHOUT CONTRAST
TECHNIQUE: Multiplanar, multisequence MR imaging of the thoracic spine was
performed. No intravenous contrast was administered.

[Series 21: T1 · sagittal · 3.3mm · 0.62mm/px · 4 of 17 slices shown (1 of 2)]
[im 1/17]
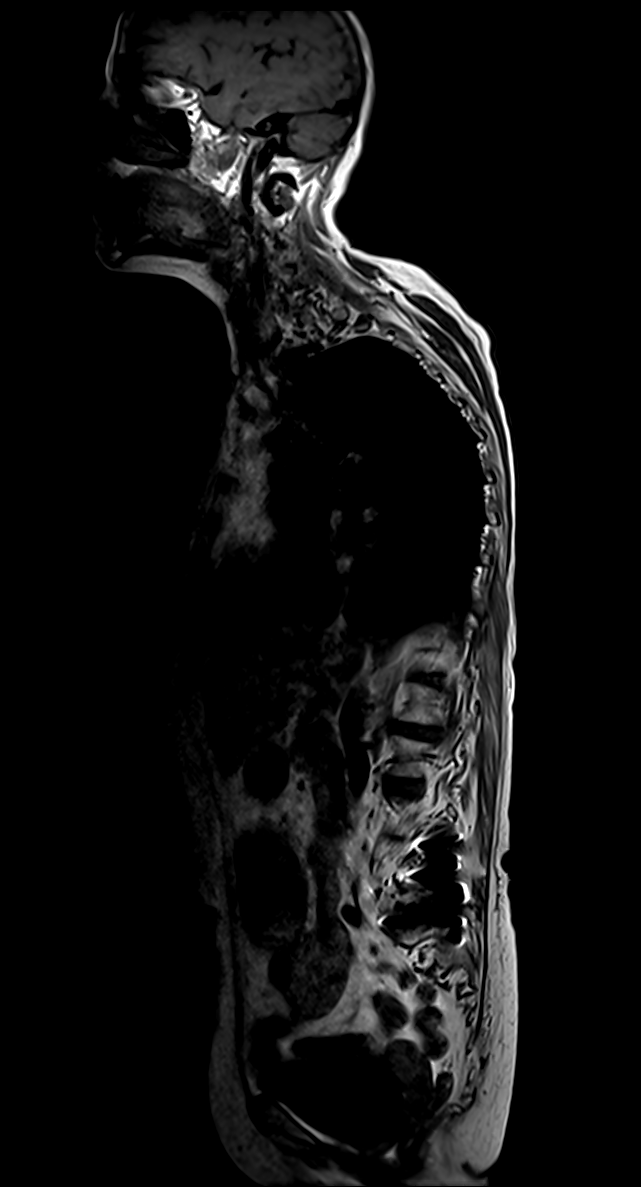
[im 5/17]
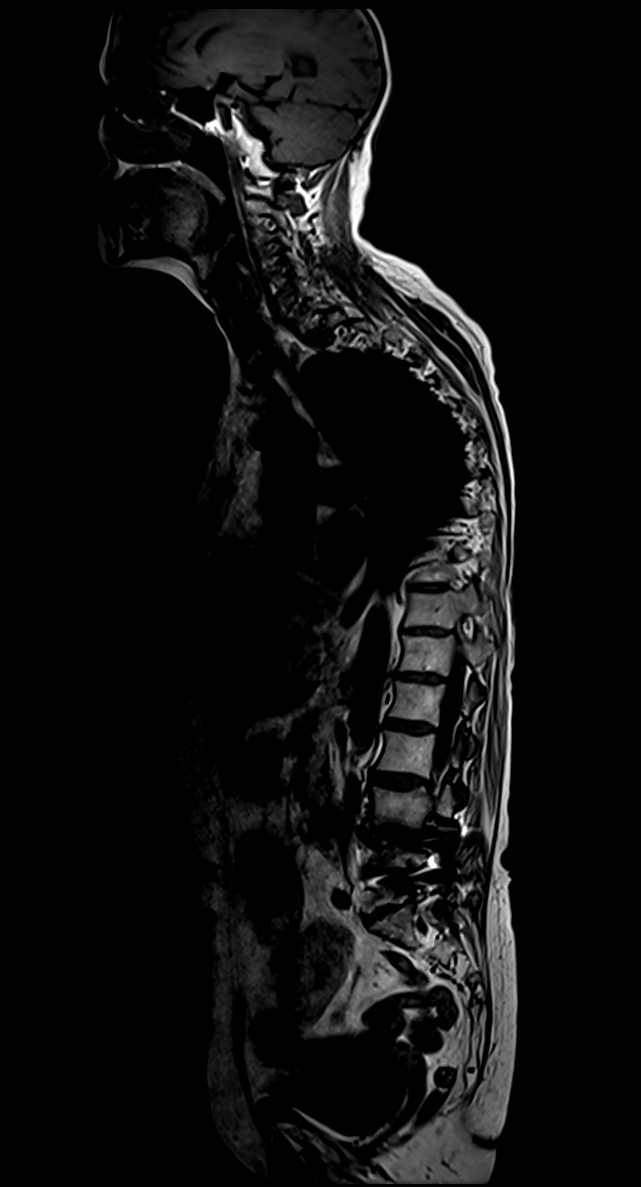
[im 9/17]
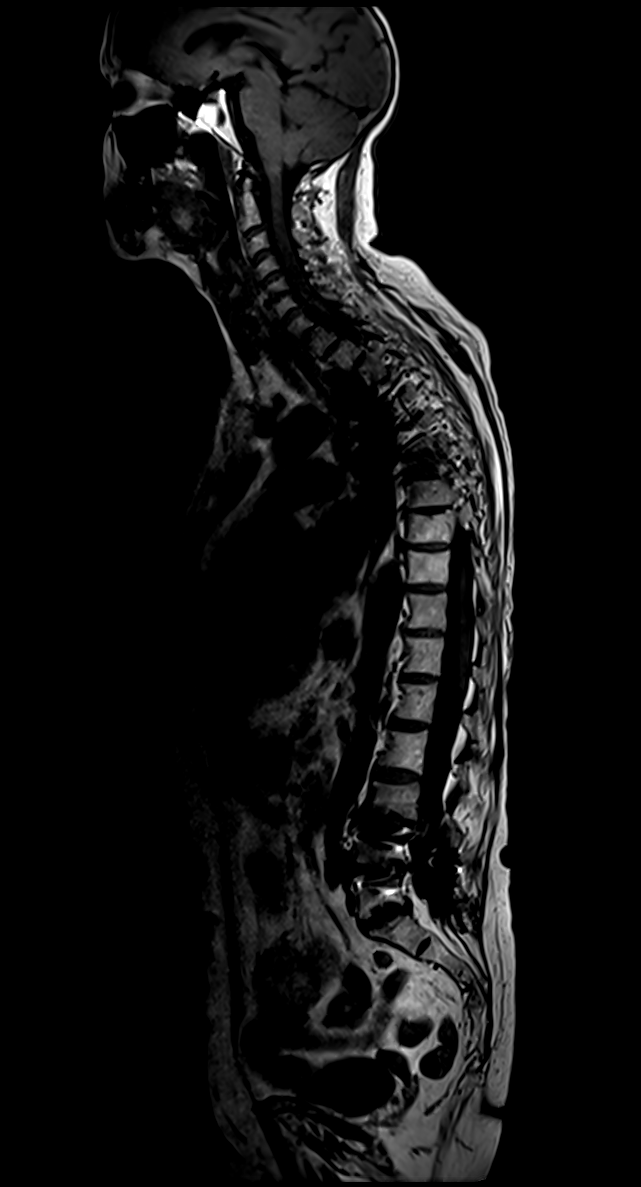
[im 17/17]
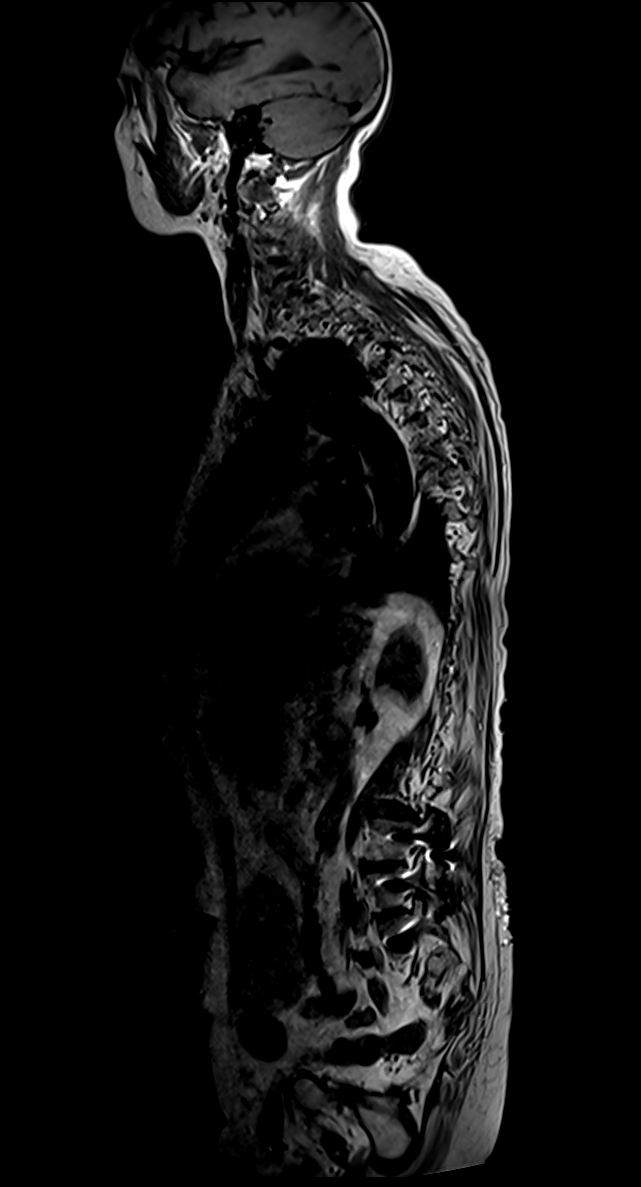

[Series 22: T2 · sagittal · 3.0mm · 0.80mm/px · 5 of 17 slices shown (1 of 2)]
[im 1/17]
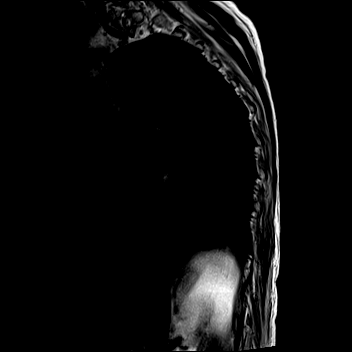
[im 5/17]
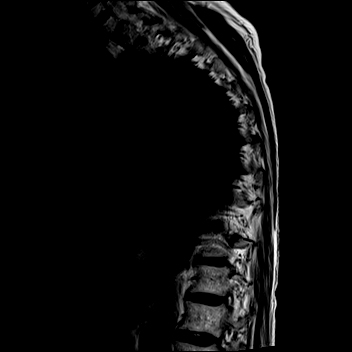
[im 9/17]
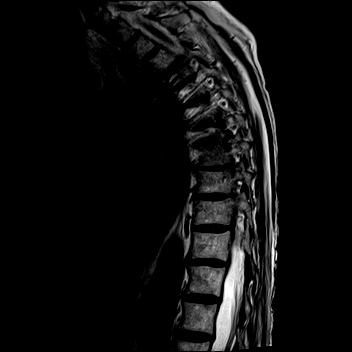
[im 13/17]
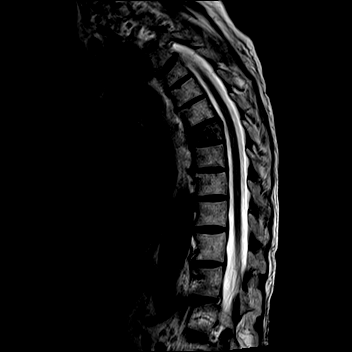
[im 17/17]
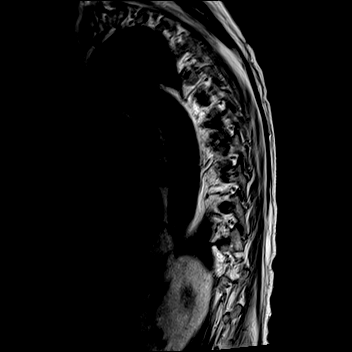

[Series 23: T1 · sagittal · 3.0mm · 0.80mm/px · 3 of 17 slices shown (2 of 2)]
[im 1/17]
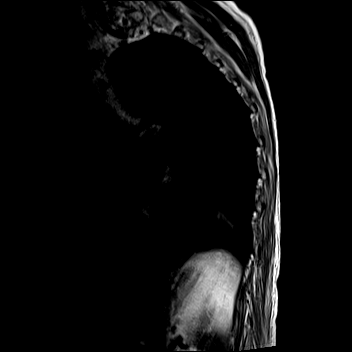
[im 9/17]
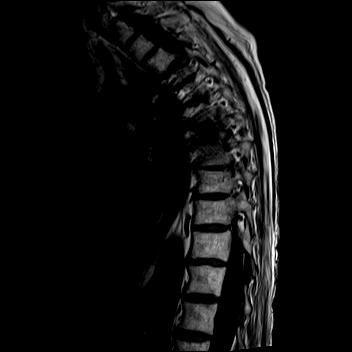
[im 17/17]
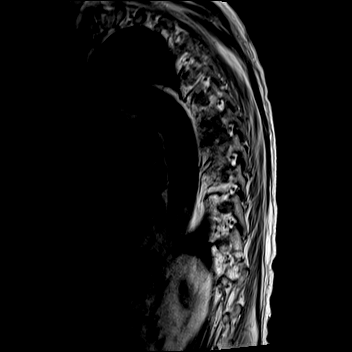

[Series 25: T2 · axial · 5.0mm · 0.59mm/px · z∈[-246,+57]mm · 9 of 39 slices shown (2 of 2)]
[im 1/39]
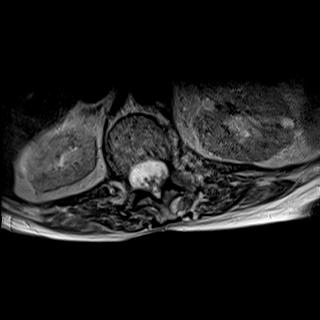
[im 7/39]
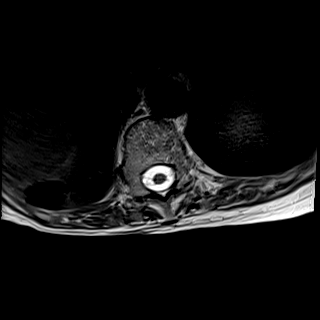
[im 11/39]
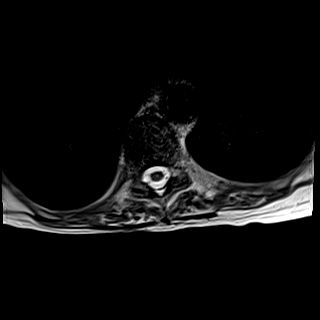
[im 18/39]
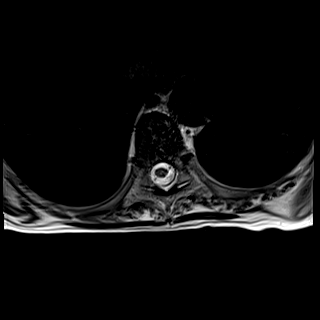
[im 21/39]
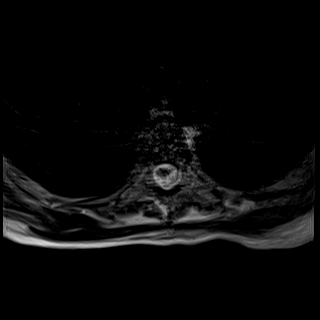
[im 28/39]
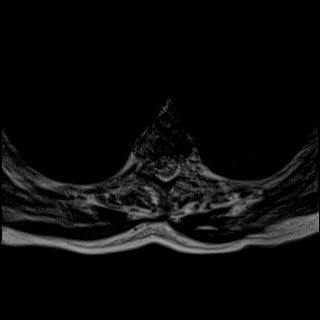
[im 32/39]
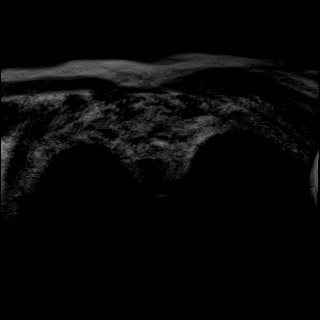
[im 35/39]
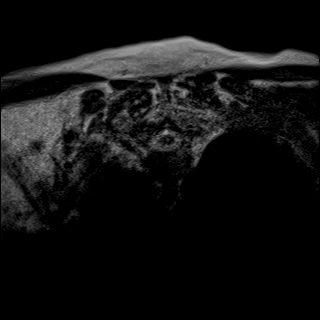
[im 39/39]
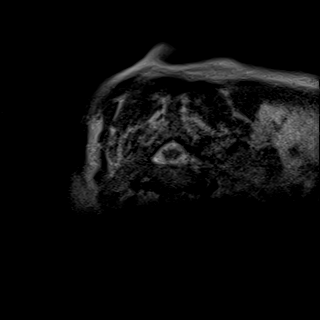

[21 of 48 positions shown; findings below may reference images not displayed]

FINDINGS: Motion limited evaluation.  Within this limitation:

Alignment: Exaggerated thoracic kyphosis. Reverse S-shaped
thoracolumbar curvature.

Vertebrae: Approximately 40% height loss of T7 vertebral body with
associated bone marrow edema, compatible with acute/recent
compression fracture. There is mild surrounding paraspinous edema.
Slight bony retropulsion along the superior endplate without
significant canal stenosis. Mild bilateral foraminal stenosis at
T7-T8.

Remote T4 compression fracture with approximately 40% height loss
and no bone marrow edema. Height loss is similar to priors.

Cord:  Normal cord signal.

Paraspinal and other soft tissues: Unremarkable. Partially imaged
left cyst.

Disc levels:

Motion limited evaluation without evidence of significant canal
stenosis. Small right eccentric disc bulge at T8-T9 partially
effaces CSF. Slight bony retropulsion T7 also slightly root effaces
ventral CSF.

Mild foraminal stenosis bilaterally at T7 T8 and on the left at
T8-T9 and T9-T10.
IMPRESSION: 1. Acute/recent T7 compression fracture with approximately 40%
height loss. Slight bony retropulsion without significant canal
stenosis.
2. Remote T4 compression fracture without progressive height loss.
3. Multilevel mild foraminal stenosis is detailed above.

## 2023-02-12 IMAGING — CT CT ABD-PELV W/O CM
2 of 4 series · 16 of 46 positions shown, 18 images · non-contrast
Comparison: May 17, 2021

CLINICAL DATA: Constipation

EXAM:
CT ABDOMEN AND PELVIS WITHOUT CONTRAST
TECHNIQUE: Multidetector CT imaging of the abdomen and pelvis was performed
following the standard protocol without IV contrast.

[Series 3: abd/ pelvis 5.0 i30f 2 · axial · 0.85mm/px · z∈[+1064,+1419]mm · 13 of 79 slices shown, 15 images]
[im 4/79  soft-tissue]
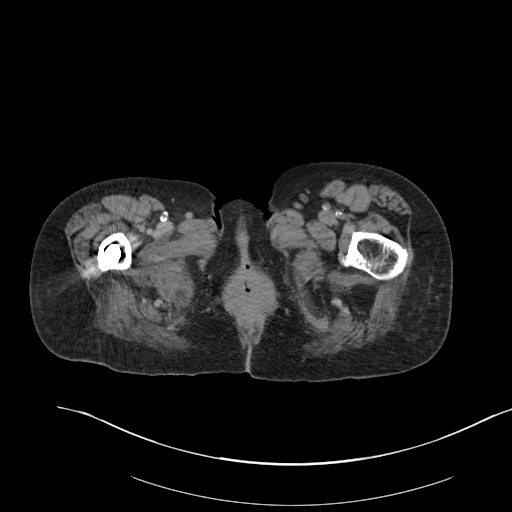
[im 4/79  bone]
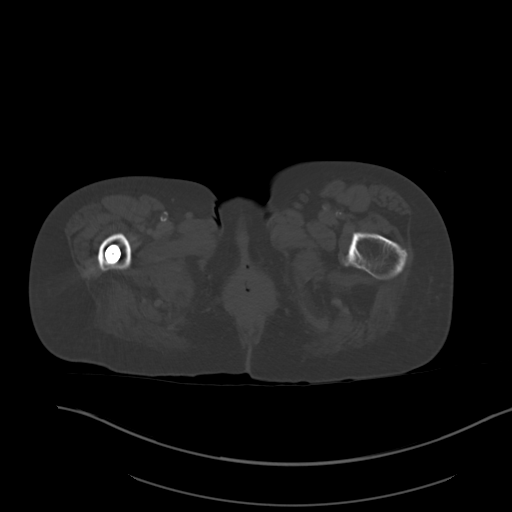
[im 10/79  soft-tissue]
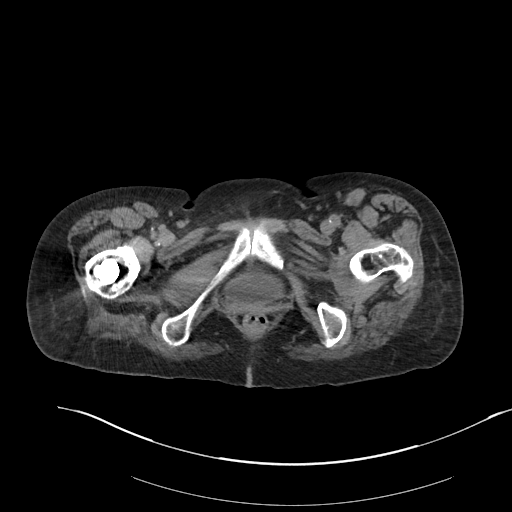
[im 17/79  soft-tissue]
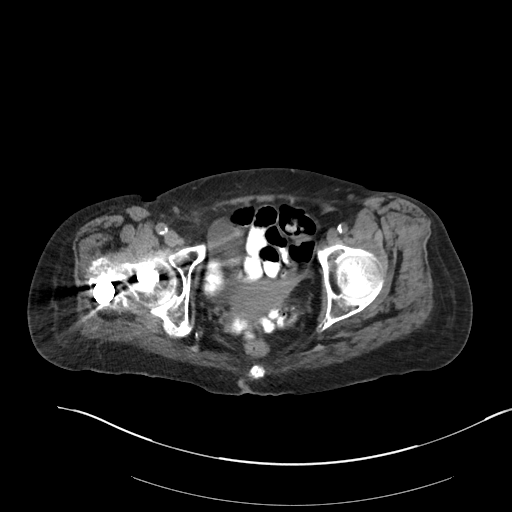
[im 23/79  soft-tissue]
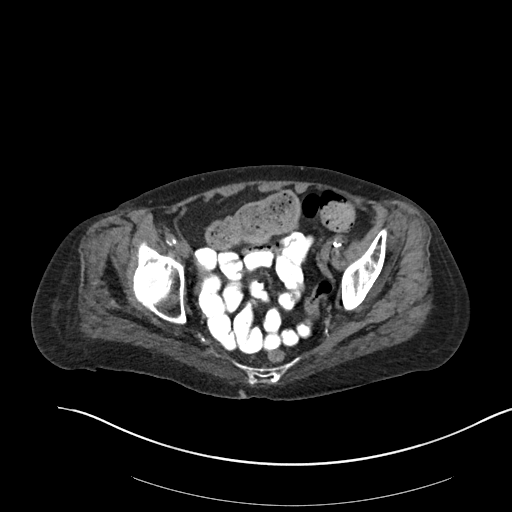
[im 27/79  soft-tissue]
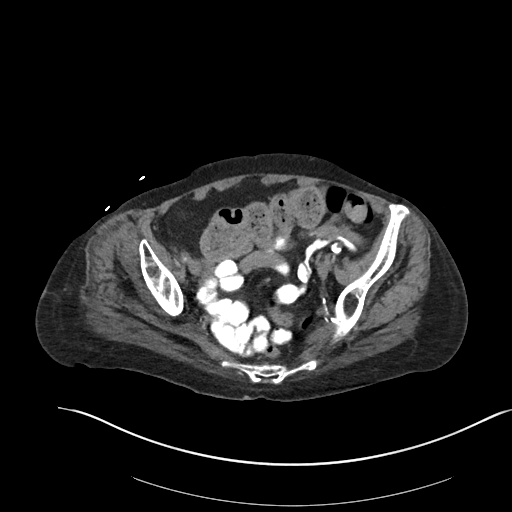
[im 33/79  soft-tissue]
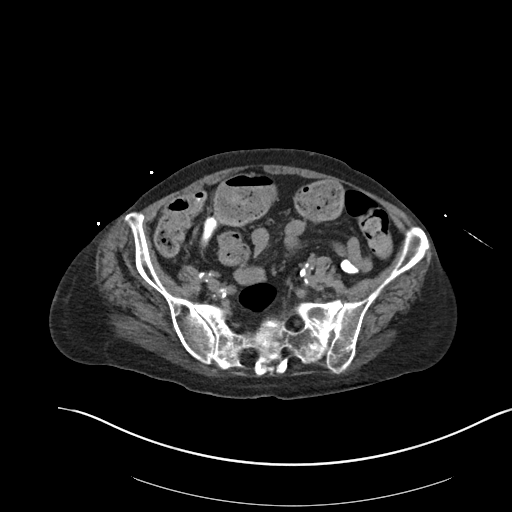
[im 40/79  soft-tissue]
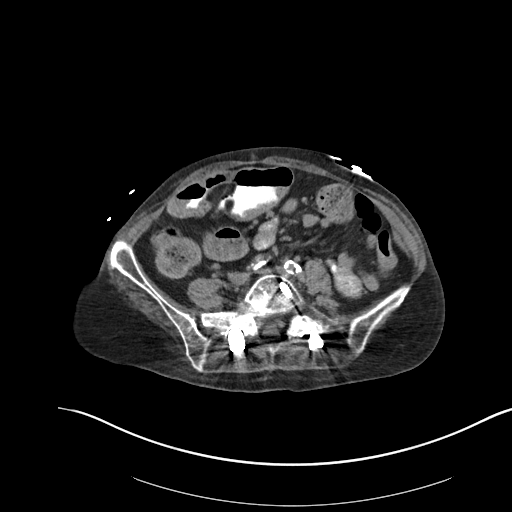
[im 46/79  soft-tissue]
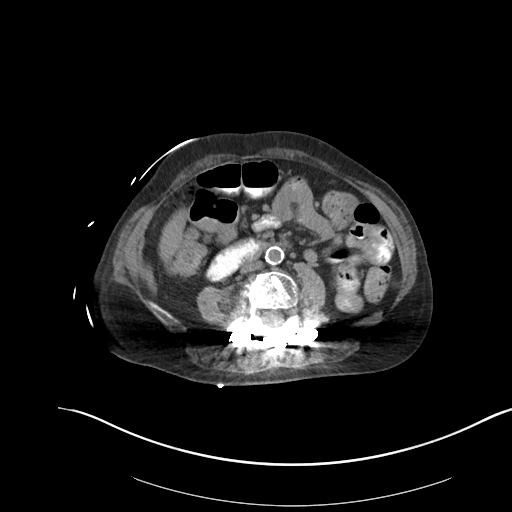
[im 53/79  soft-tissue]
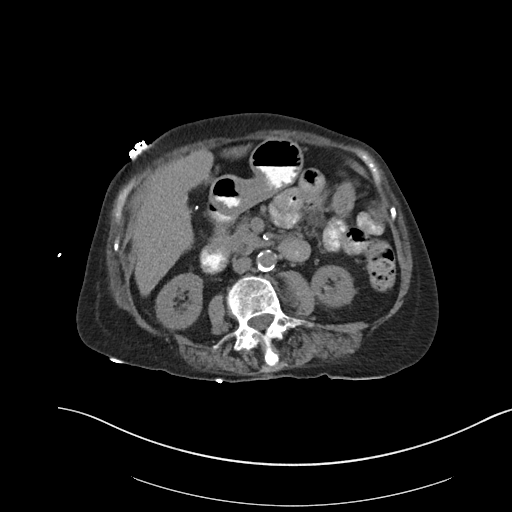
[im 53/79  bone]
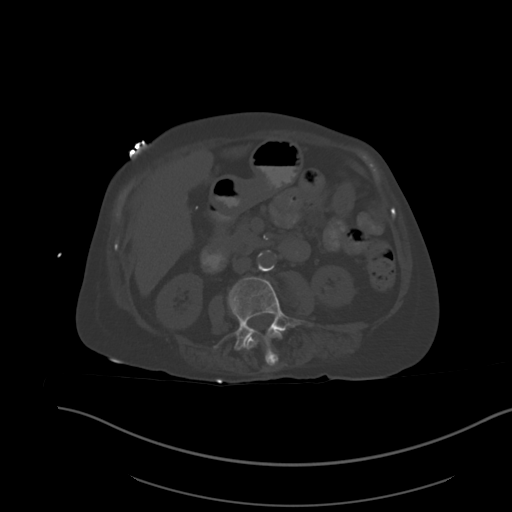
[im 56/79  soft-tissue]
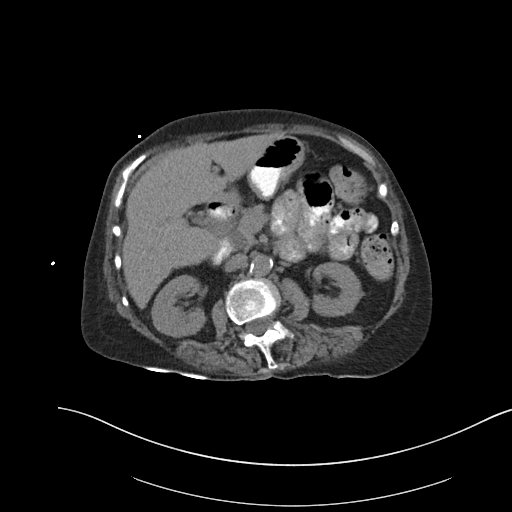
[im 62/79  soft-tissue]
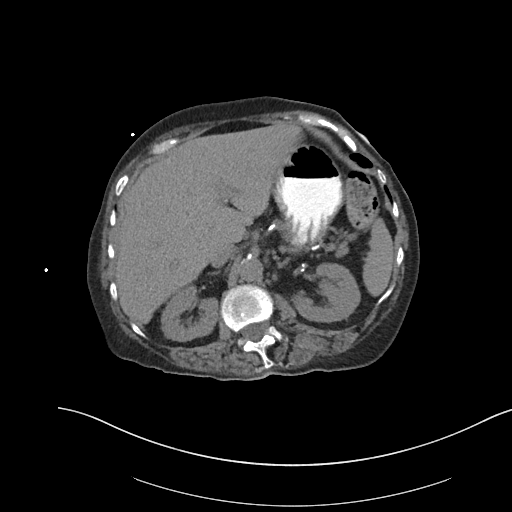
[im 69/79  soft-tissue]
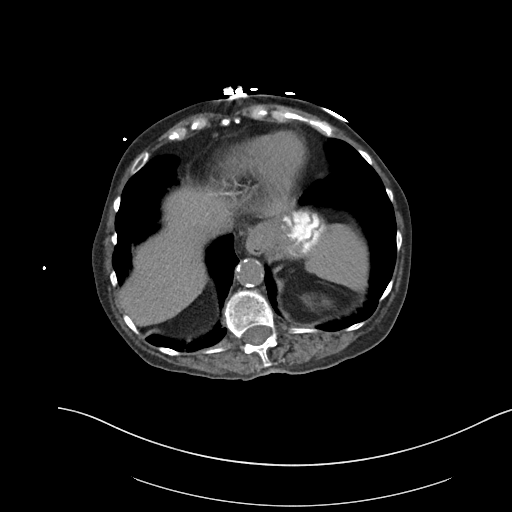
[im 75/79  soft-tissue]
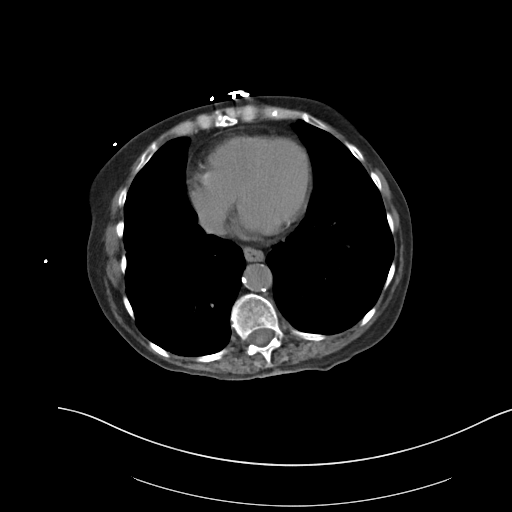

[Series 6: cor st · coronal · 0.71mm/px · 3 of 87 slices shown]
[im 29/87  soft-tissue]
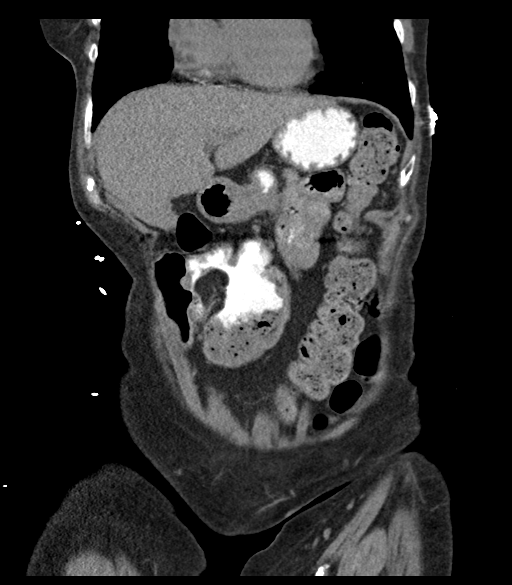
[im 39/87  soft-tissue]
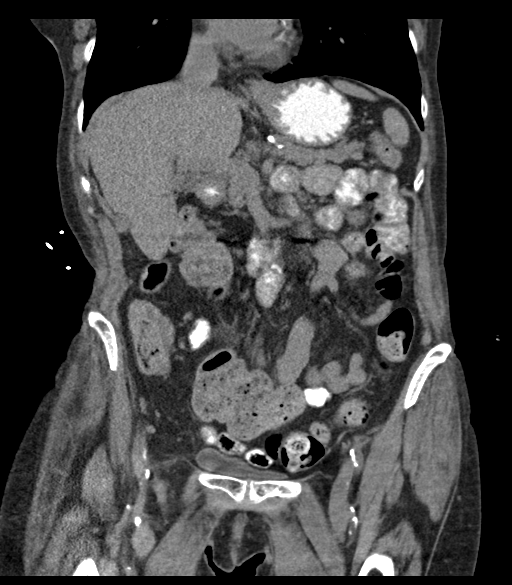
[im 48/87  soft-tissue]
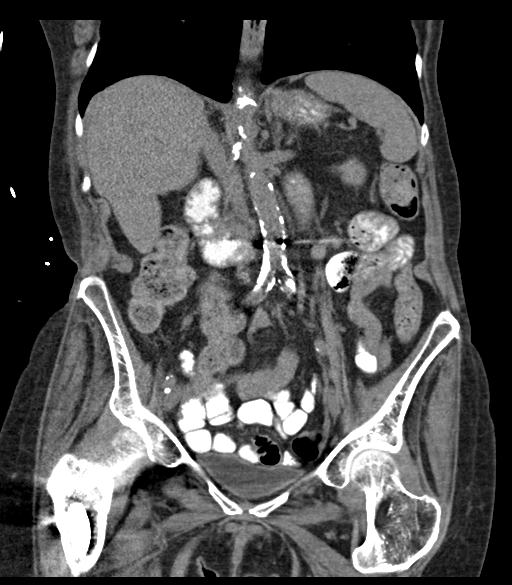

[16 of 46 positions shown; findings below may reference images not displayed]

FINDINGS: Lower chest: RIGHT lower lobe atelectasis.

Hepatobiliary: Mildly nodular contour of the liver could reflect
underlying cirrhosis. Status post cholecystectomy. Unchanged
appearance of a prominent common bile duct, likely due to post
cholecystectomy state.

Pancreas: Unremarkable noncontrast appearance.

Spleen: Unremarkable.

Adrenals/Urinary Tract: Likely nonobstructive RIGHT-sided
nephrolithiasis, unchanged. Calcifications of the LEFT kidney are
favored to be vascular in origin. No hydronephrosis. Bladder is
decompressed. Adrenal glands are unremarkable.

Stomach/Bowel: No bowel obstruction. Moderate colonic stool burden
of predominantly the RIGHT and transverse colon. Status post
appendectomy. No new focal bowel wall inflammation. Small hiatal
hernia.

Vascular/Lymphatic: Severe atherosclerotic calcifications. No new
suspicious lymphadenopathy.

Reproductive: Uterus and bilateral adnexa are unremarkable.

Other: No free air or free fluid.

Musculoskeletal: Osteopenia. Status post posterior fixation of the
lower lumbar spine. Status post intramedullary rod fixation of the
RIGHT femur. Similar trace lucency adjacent to the RIGHT inferior
screw of lumbar spinal hardware. Remote RIGHT posterior eleventh rib
fracture.
IMPRESSION: 1. No evidence of bowel obstruction. Moderate colonic stool burden
predominately in the RIGHT and transverse colon.
2. Mildly nodular contour of the liver could reflect underlying
cirrhosis.

Aortic Atherosclerosis ([id number]).

## 2023-02-12 IMAGING — MR MR LUMBAR SPINE W/O CM
4 of 5 series · 34 of 48 positions shown · non-contrast
Comparison: May 26, 2017.

CLINICAL DATA: Low back pain.

EXAM:
MRI LUMBAR SPINE WITHOUT CONTRAST
TECHNIQUE: Multiplanar, multisequence MR imaging of the lumbar spine was
performed. No intravenous contrast was administered.

[Series 1: T2 · sagittal · 4.0mm · 0.86mm/px · 8 of 15 slices shown (1 of 2)]
[im 1/15]
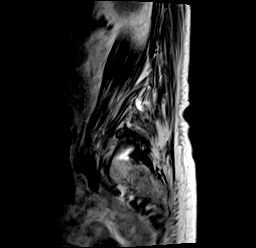
[im 3/15]
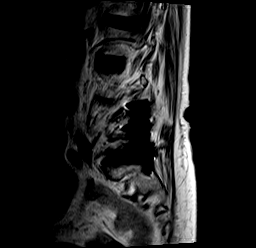
[im 5/15]
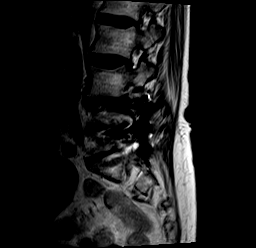
[im 7/15]
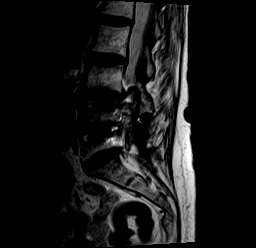
[im 9/15]
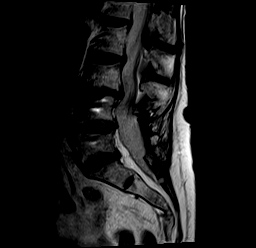
[im 11/15]
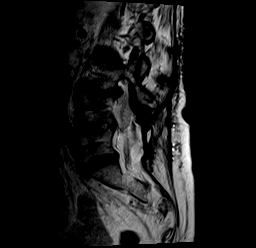
[im 13/15]
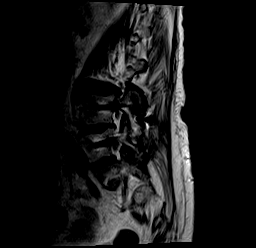
[im 15/15]
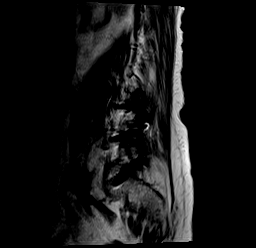

[Series 3: T1 · sagittal · 4.0mm · 0.86mm/px · 8 of 15 slices shown (1 of 2)]
[im 1/15]
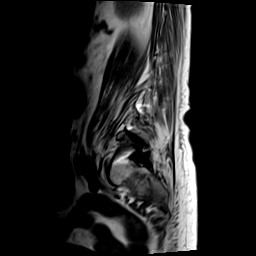
[im 3/15]
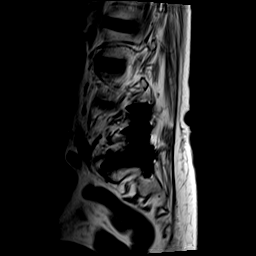
[im 5/15]
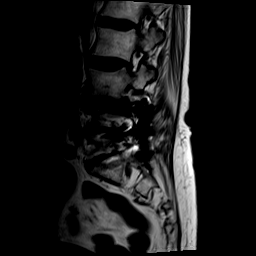
[im 7/15]
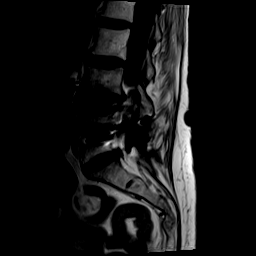
[im 9/15]
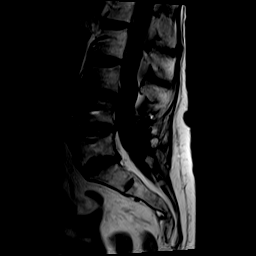
[im 11/15]
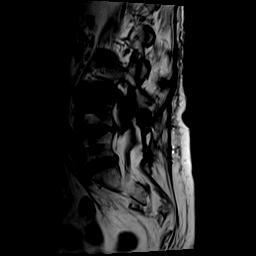
[im 13/15]
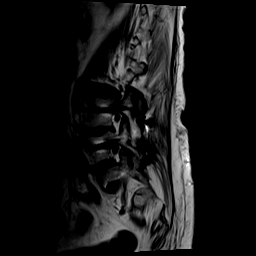
[im 15/15]
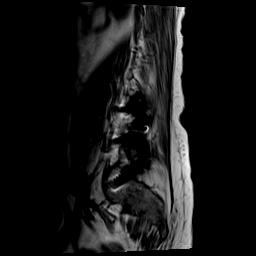

[Series 4: T2 · axial · 5.0mm · 0.69mm/px · z∈[-415,-259]mm · 9 of 22 slices shown (2 of 2)]
[im 1/22]
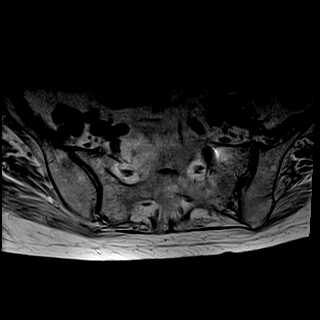
[im 4/22]
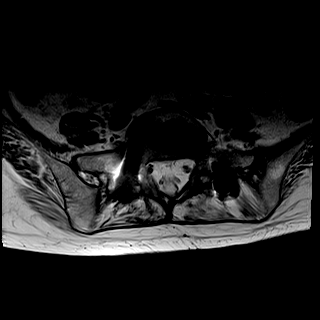
[im 6/22]
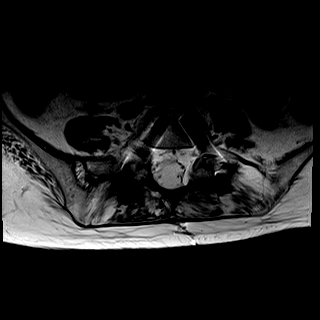
[im 10/22]
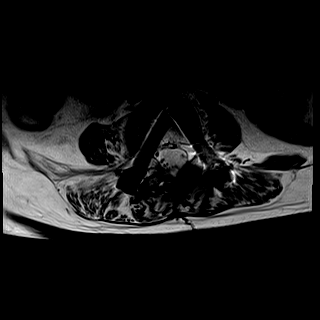
[im 12/22]
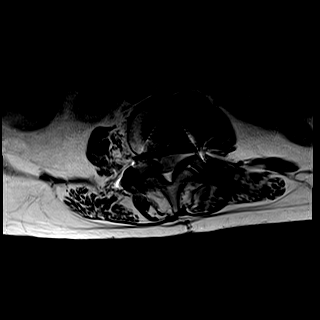
[im 16/22]
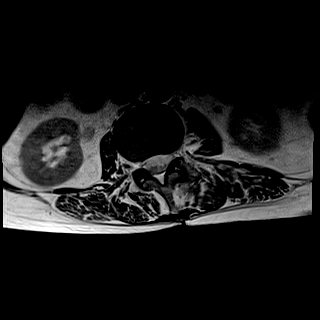
[im 18/22]
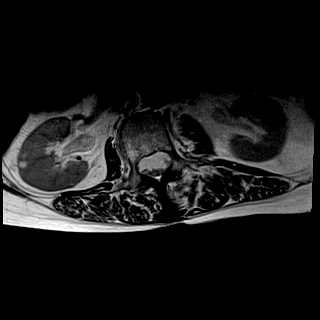
[im 20/22]
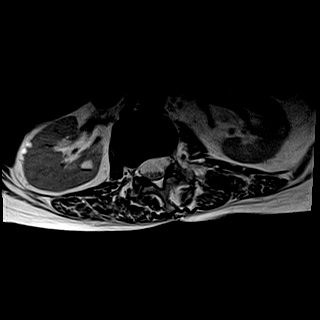
[im 22/22]
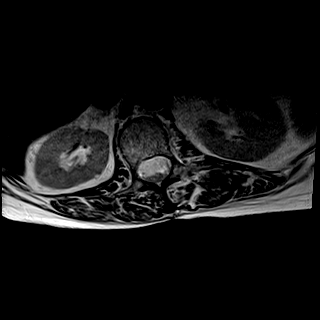

[Series 5: T1 · axial · 5.0mm · 0.40mm/px · z∈[-415,-259]mm · 9 of 22 slices shown (2 of 2)]
[im 1/22]
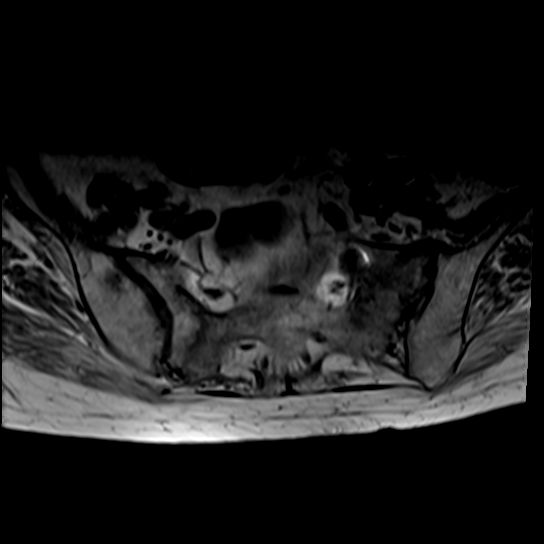
[im 4/22]
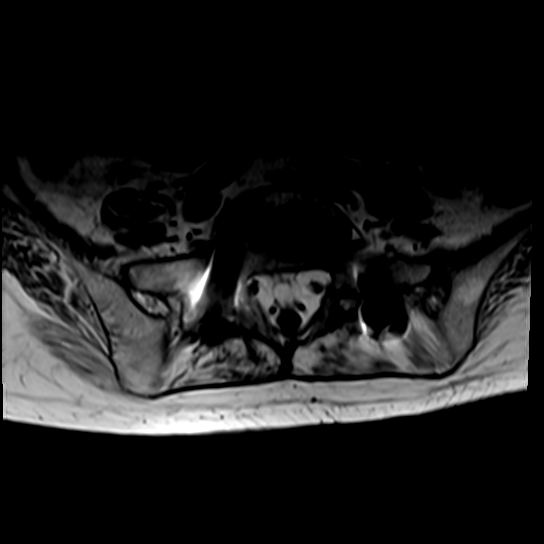
[im 6/22]
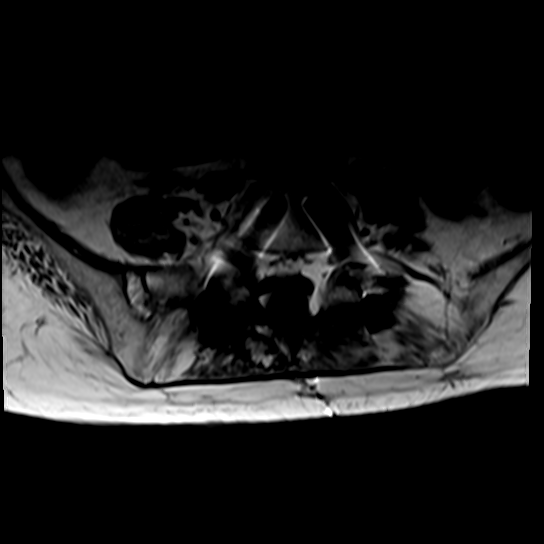
[im 10/22]
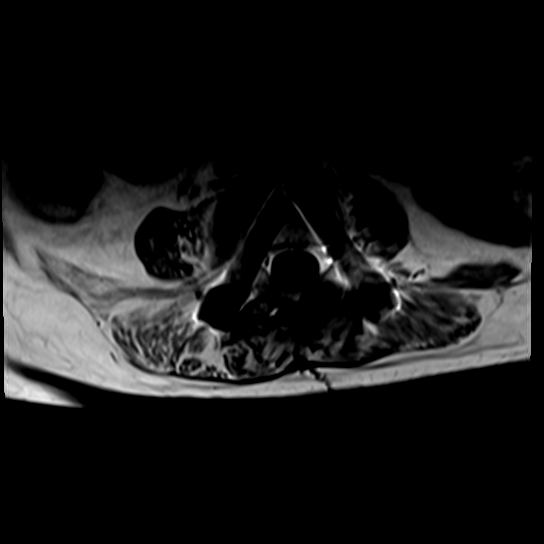
[im 12/22]
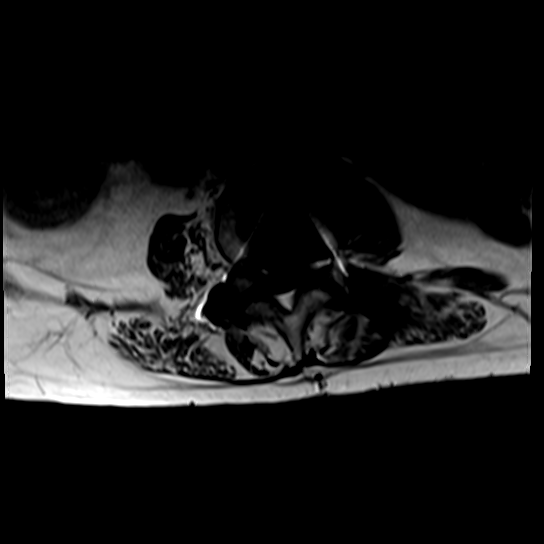
[im 16/22]
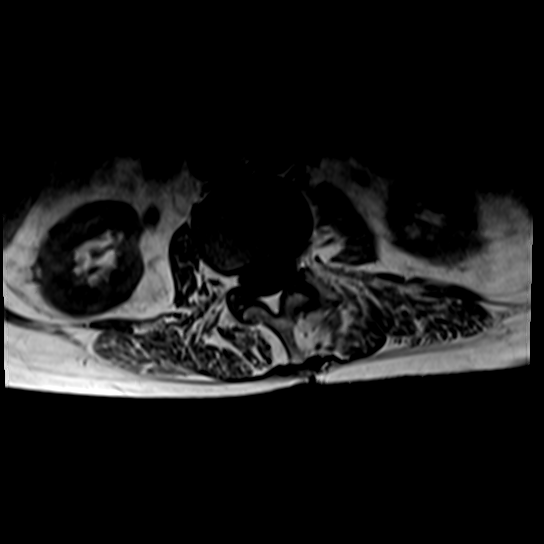
[im 18/22]
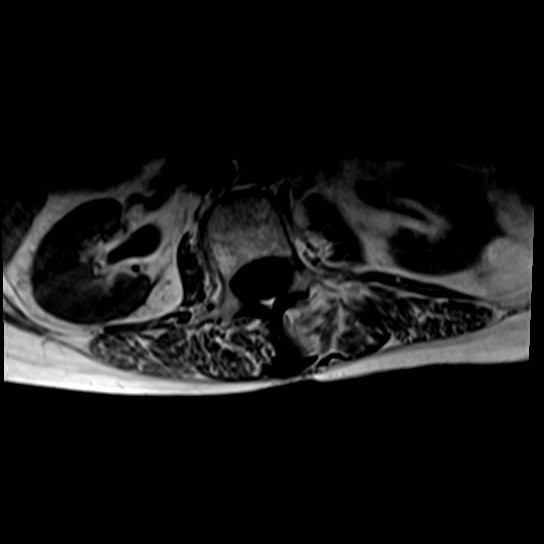
[im 20/22]
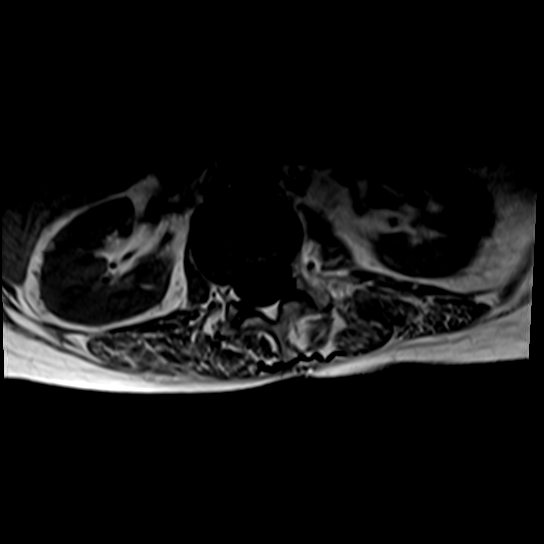
[im 22/22]
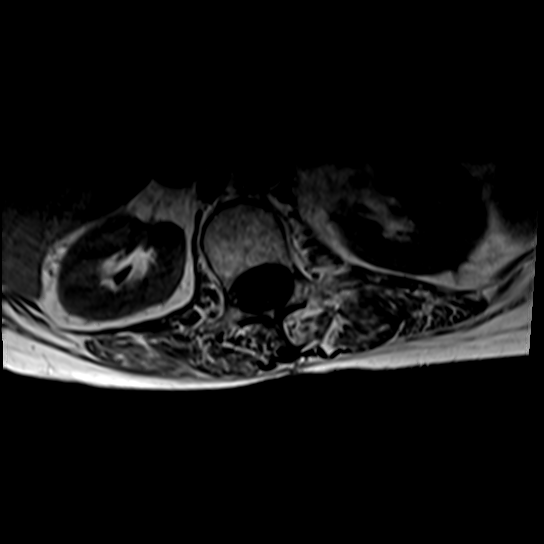

[34 of 48 positions shown; findings below may reference images not displayed]

FINDINGS: Segmentation: The inferior-most fully formed intervertebral disc is
labeled L5-S1, consistent with prior numbering.

Alignment: Moderate rotatory dextrocurvature. Grade 1 retrolisthesis
of L3 on L4, similar to prior.

Vertebrae: Vertebral body heights are maintained. No specific
evidence of acute fracture or discitis/osteomyelitis, although
metallic artifact from posterior fusion hardware at L4-S1 limits
evaluation.

Conus medullaris and cauda equina: The conus is not imaged on this
study. The conus appears unremarkable on concurrent MRI of the
thoracic spine and terminates at the superior L1 level.

Paraspinal and other soft tissues: Bilateral renal cyst including a
complex cyst in the interpolar right kidney which is similar in size
(approximately 8 mm). Additional complex right renal cyst or not
imaged on the prior.

Disc levels:

Motion limited evaluation.  Within this limitation:

T12-L1: Not imaged.

L1-L2: Small right foraminal disc protrusion without significant
canal or foraminal stenosis.

L2-L3: Mild bilateral facet hypertrophy and small bilateral
foraminal disc protrusions without significant canal or foraminal
stenosis.

L3-L4: Degenerative disc height loss and endplate changes. Right
eccentric disc bulge with endplate spurring and mild bilateral facet
hypertrophy. Resulting moderate right and mild left foraminal
stenosis, likely mildly progressed from the prior. Similar mild to
moderate right subarticular recess stenosis and mild canal stenosis.

L4-L5: Posterior fusion and decompression. No significant canal or
foraminal stenosis.

L5-S1: Mild disc bulging and right greater than left facet
hypertrophy with mild right and borderline left foraminal stenosis,
similar.
IMPRESSION: 1. L4-L5 PLIF. Motion limited evaluation with suspected mild
progression of adjacent level degenerative change at L3-L4 where
there is moderate right and mild left foraminal stenosis, mild to
moderate right subarticular recess stenosis, and mild canal
stenosis.
2. Similar L5-S1 degenerative change with suspected mild right
foraminal stenosis.
3. Similar size of an 8 mm complex right interpolar cyst. Additional
complex right renal cysts were not imaged on the prior. Consider non
urgent renal ultrasound to further evaluate.

## 2023-02-12 IMAGING — CR DG CHEST 2V
2 series · 2 of 2 positions shown · non-contrast
Comparison: 05/05/2021 and older studies.

CLINICAL DATA: Hypoxia.  Constipation for 2 weeks.

EXAM:
CHEST - 2 VIEW

[chest lat]
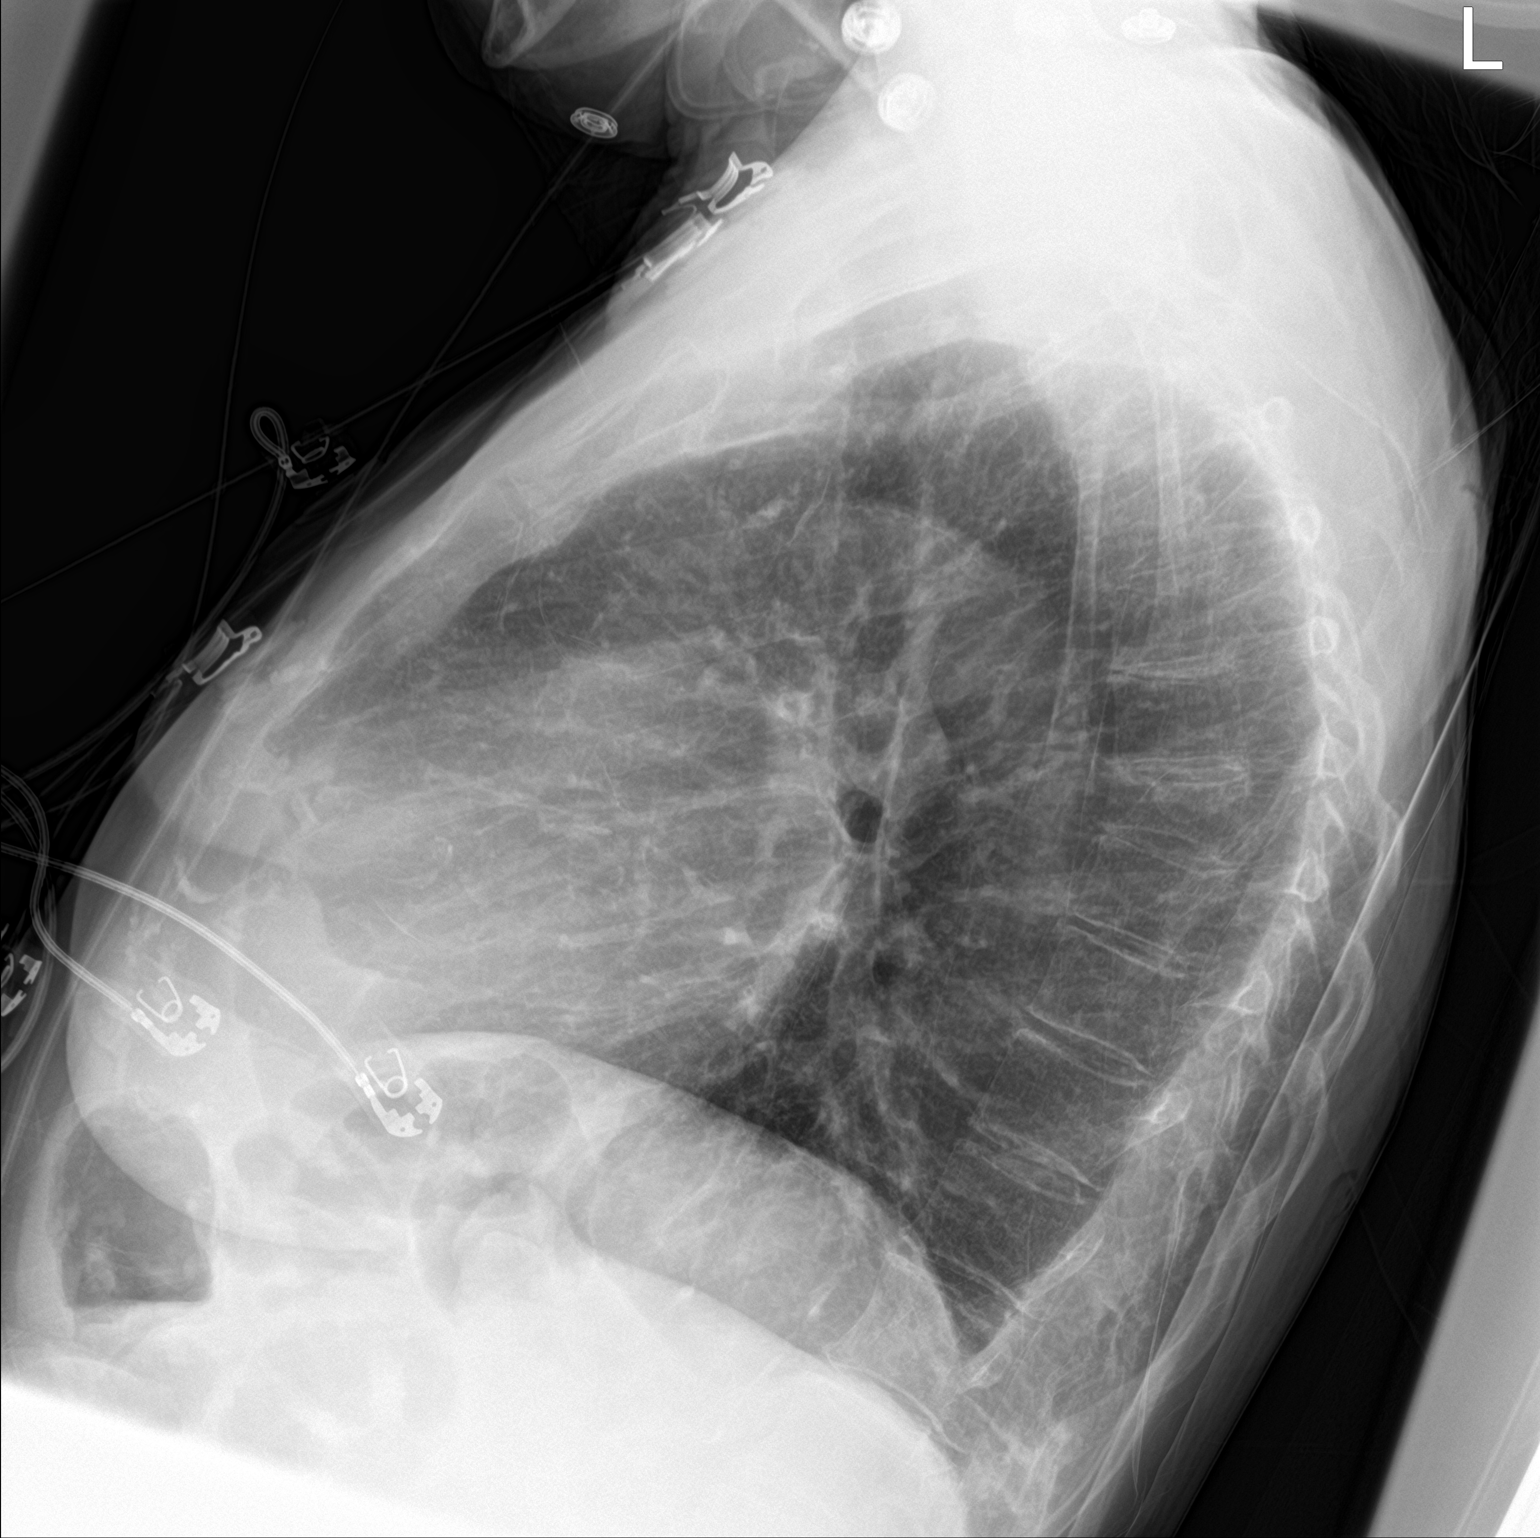

[chest ap]
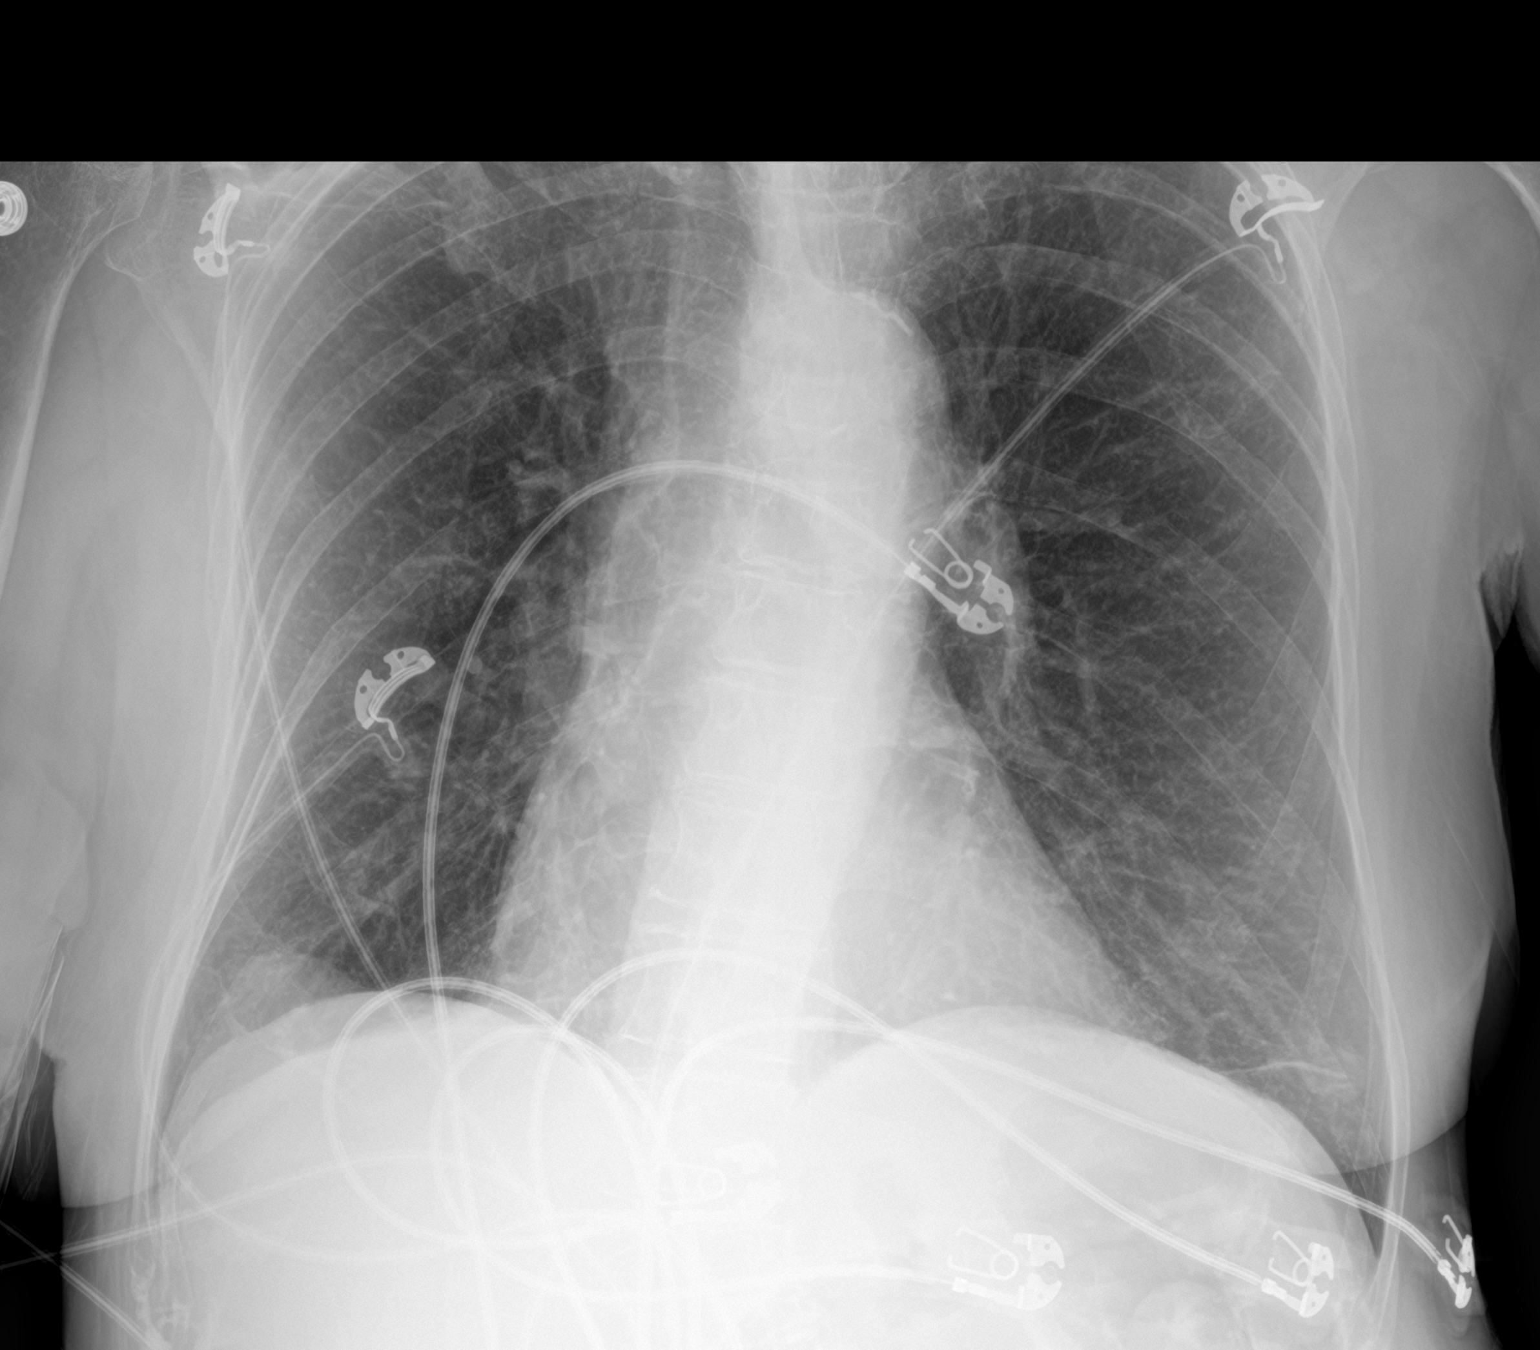

[2 of 2 positions shown; findings below may reference images not displayed]

FINDINGS: Cardiac silhouette is normal in size. No mediastinal or hilar masses
or evidence of adenopathy.

Lungs mildly hyperexpanded. Mild linear opacity at the left lung
base consistent with scarring or atelectasis. Lungs otherwise clear.

No pleural effusion or pneumothorax.

Skeletal structures are demineralized. Mild compression fracture a
midthoracic vertebra, T7 or T8, not evident on the previous chest CT
from 05/05/2021. The T4 fracture seen on the prior study is not well
visualized on the current exam. No other evidence of a fracture. No
bone lesion.
IMPRESSION: 1. No acute cardiopulmonary disease.
2. Mild compression fracture of a midthoracic vertebra new since the
CT dated 05/05/2021.

## 2023-03-13 IMAGING — CT CT CHEST W/O CM
2 of 5 series · 13 of 36 positions shown, 16 images · non-contrast
Comparison: May 05, 2021 and August 09, 2020

CLINICAL DATA: Chest trauma.

EXAM:
CT CHEST WITHOUT CONTRAST
TECHNIQUE: Multidetector CT imaging of the chest was performed following the
standard protocol without IV contrast.

[Series 4: thorax 2.0 · axial · 0.68mm/px · z∈[-365,-137]mm · 10 of 140 slices shown, 13 images]
[im 13/140  mediastinal]
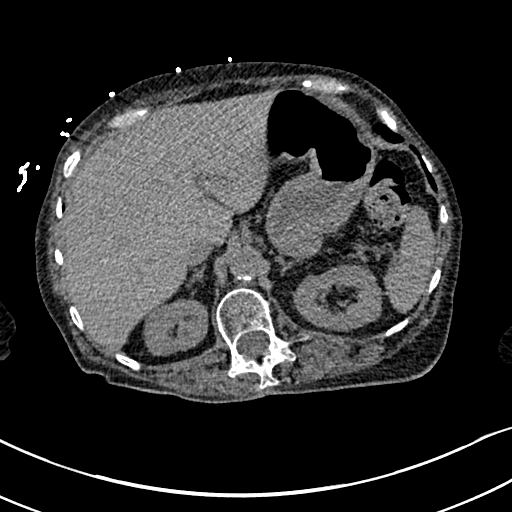
[im 13/140  lung]
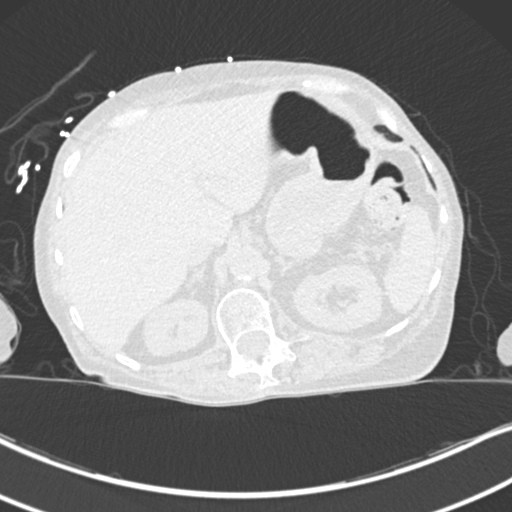
[im 26/140  lung]
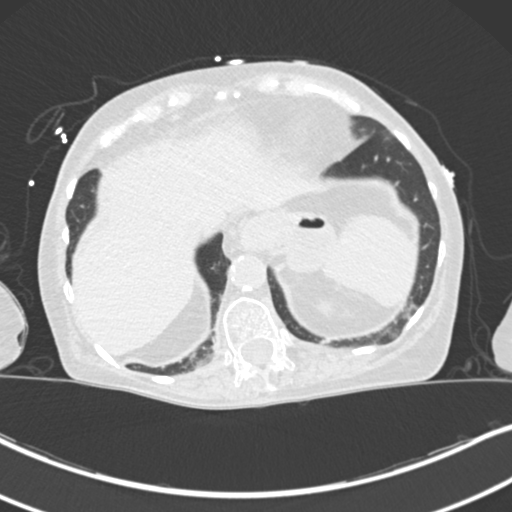
[im 38/140  lung]
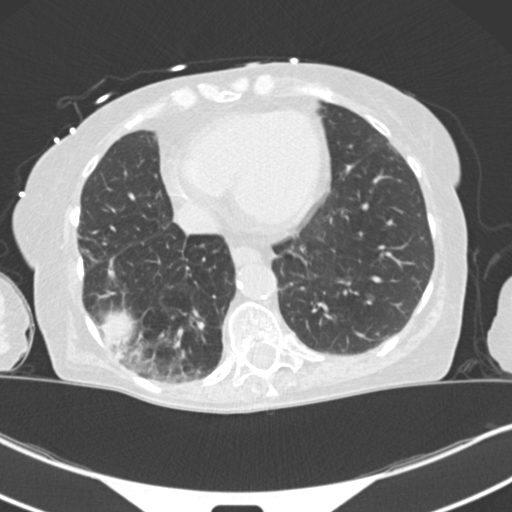
[im 51/140  lung]
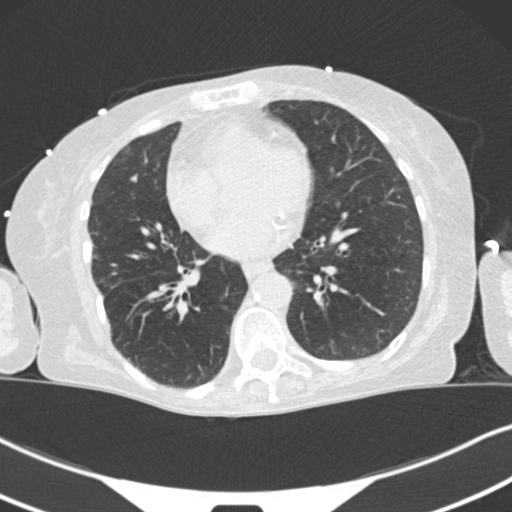
[im 64/140  mediastinal]
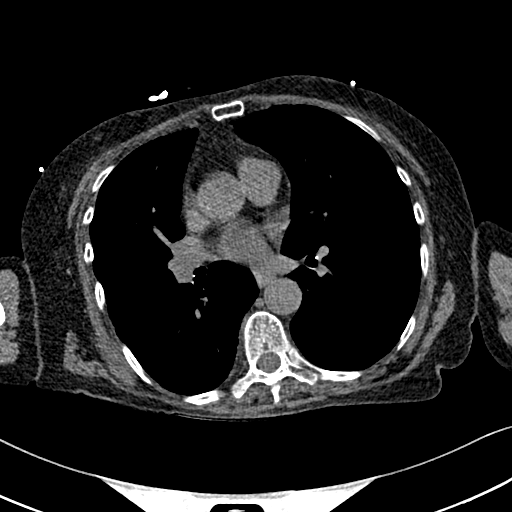
[im 64/140  lung]
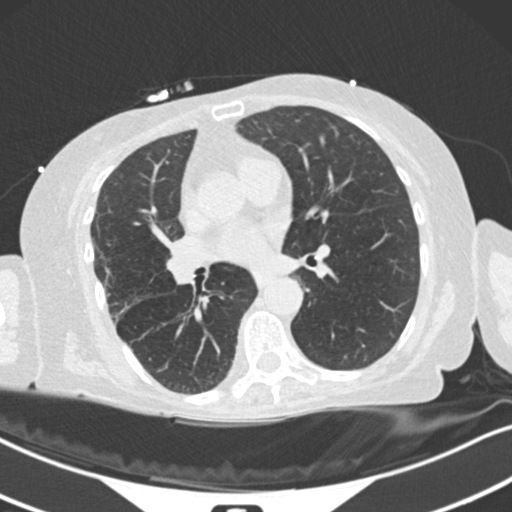
[im 76/140  lung]
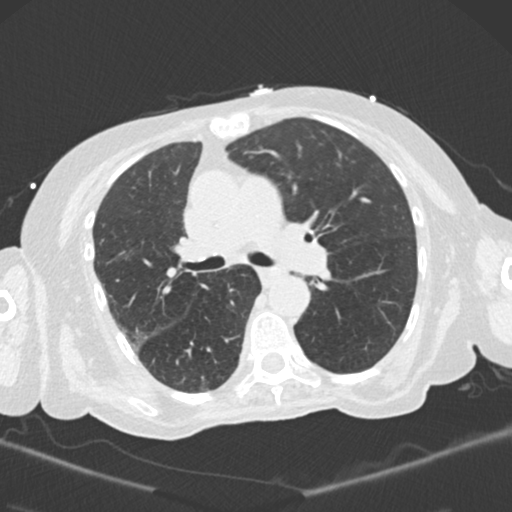
[im 89/140  lung]
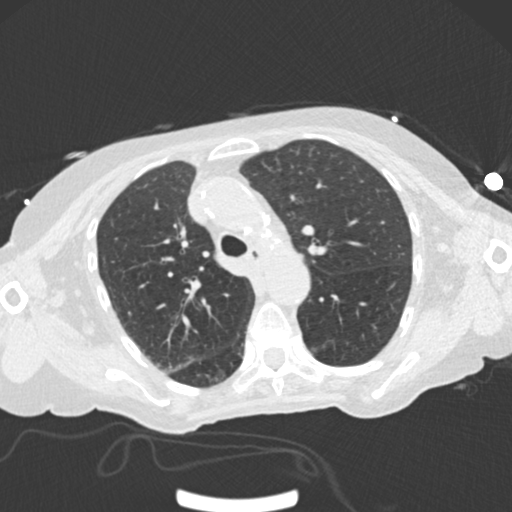
[im 102/140  lung]
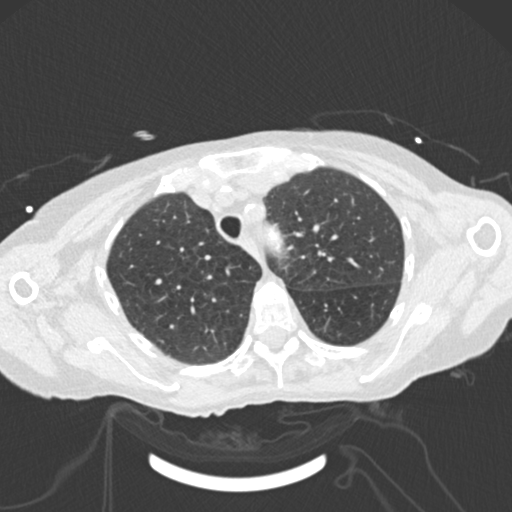
[im 114/140  mediastinal]
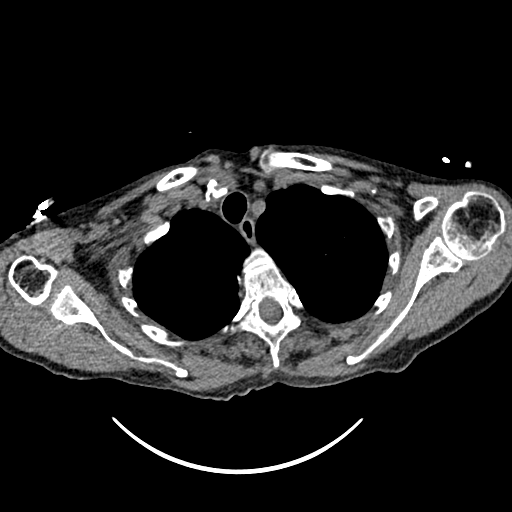
[im 114/140  lung]
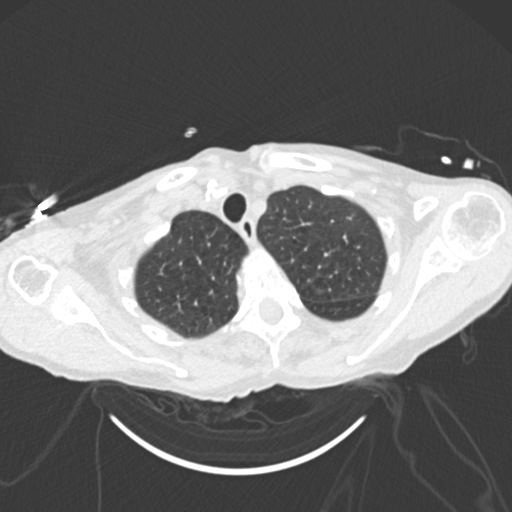
[im 127/140  lung]
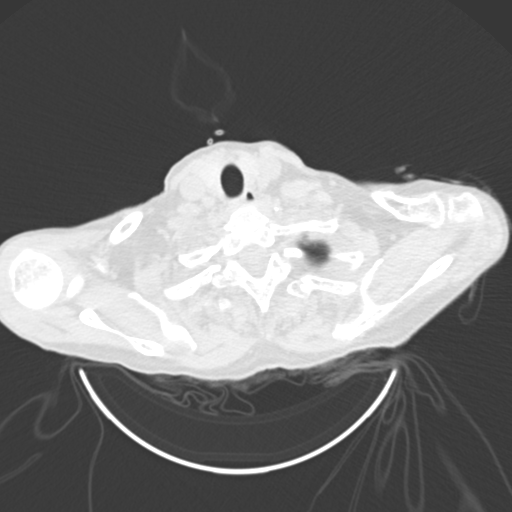

[Series 6: coronal · coronal · 0.59mm/px · 3 of 83 slices shown]
[im 17/83  lung]
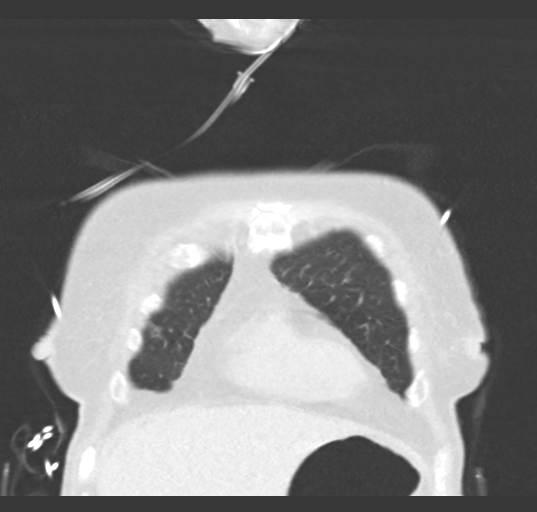
[im 33/83  lung]
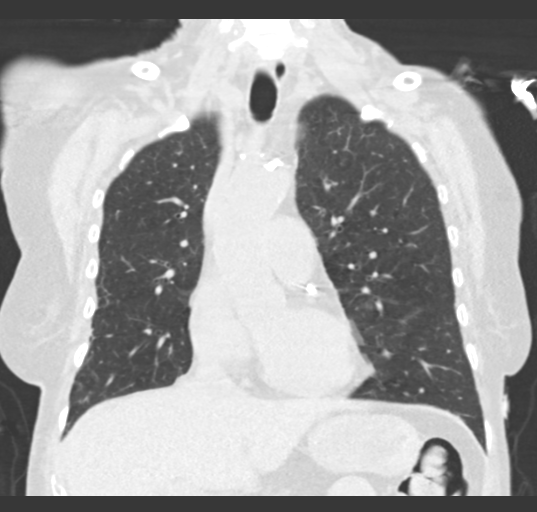
[im 50/83  lung]
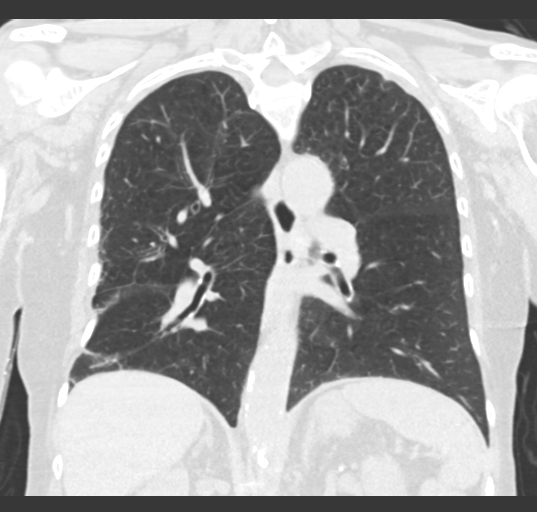

[13 of 36 positions shown; findings below may reference images not displayed]

FINDINGS: Cardiovascular: There is marked severity calcification of the aortic
arch without evidence of aneurysmal dilatation. Normal heart size
with marked severity coronary artery calcification. No pericardial
effusion.

Mediastinum/Nodes: No enlarged mediastinal or axillary lymph nodes.
Thyroid gland, trachea, and esophagus demonstrate no significant
findings.

Lungs/Pleura: A stable 4 mm noncalcified lung nodule is seen within
the posterior aspect of the right apex.

Mild areas of scarring and/or atelectasis are seen within the
bilateral lung bases.

There is no evidence of acute infiltrate, pleural effusion or
pneumothorax.

Upper Abdomen: There is a small hiatal hernia.

Musculoskeletal: Chronic fracture deformities are seen involving the
posterior eleventh right rib, sternum and T4 vertebral body.

A compression fracture deformity of the T8 vertebral body is seen
which represents a new finding when compared to the prior exam.

Degenerative changes are seen throughout the thoracic spine
IMPRESSION: 1. Multiple chronic fracture deformities, as described above, with a
compression fracture deformity of the T8 vertebral body which is new
since the prior study.
2. Stable, likely benign right apical lung nodule.
3. Small hiatal hernia.

## 2023-03-13 IMAGING — CT CT ANGIO CHEST
2 of 7 series · 19 of 46 positions shown · IV contrast (APPLIED)
Comparison: 06/22/2021

CLINICAL DATA: Left chest pain

EXAM:
CT ANGIOGRAPHY CHEST WITH CONTRAST
TECHNIQUE: Multidetector CT imaging of the chest was performed using the
standard protocol during bolus administration of intravenous
contrast. Multiplanar CT image reconstructions and MIPs were
obtained to evaluate the vascular anatomy.
CONTRAST:  50mL OMNIPAQUE IOHEXOL 350 MG/ML SOLN

[Series 6: thins · axial · 0.62mm/px · z∈[-238,+8]mm · 16 of 395 slices shown]
[im 22/395  lung]
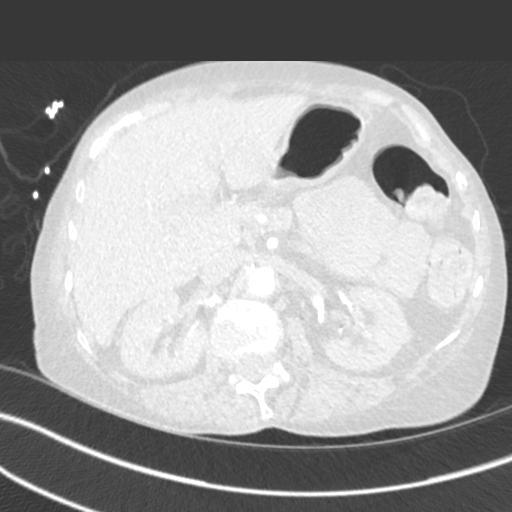
[im 44/395  soft-tissue]
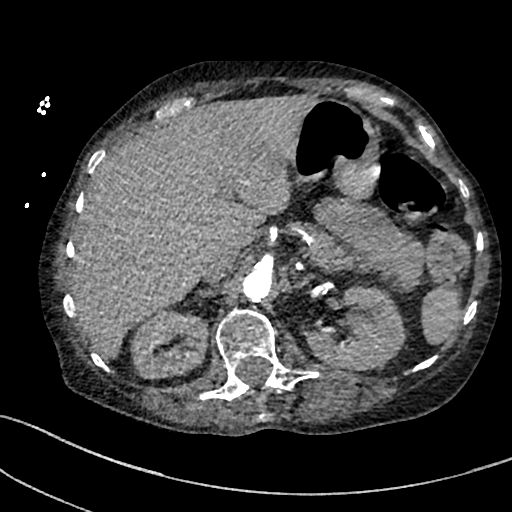
[im 66/395  lung]
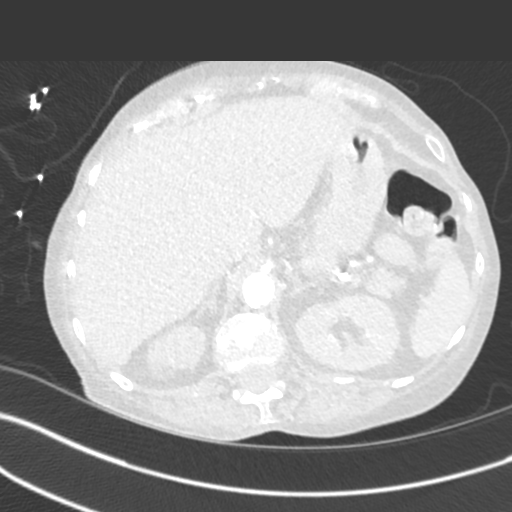
[im 88/395  soft-tissue]
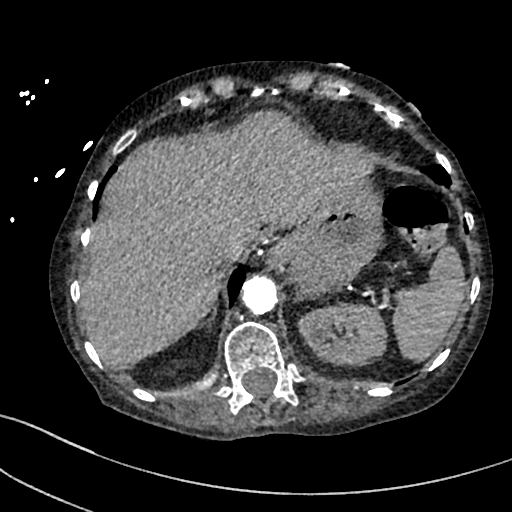
[im 110/395  lung]
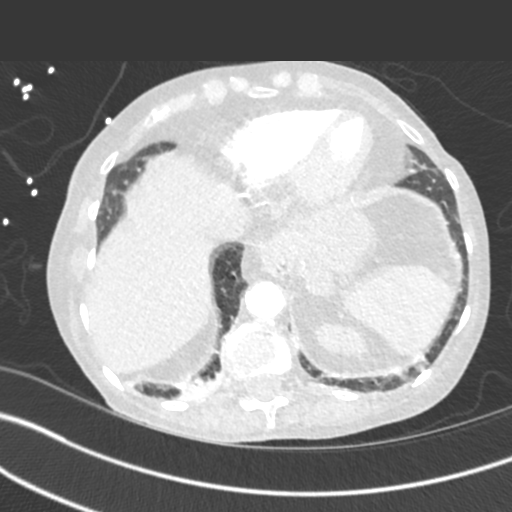
[im 132/395  soft-tissue]
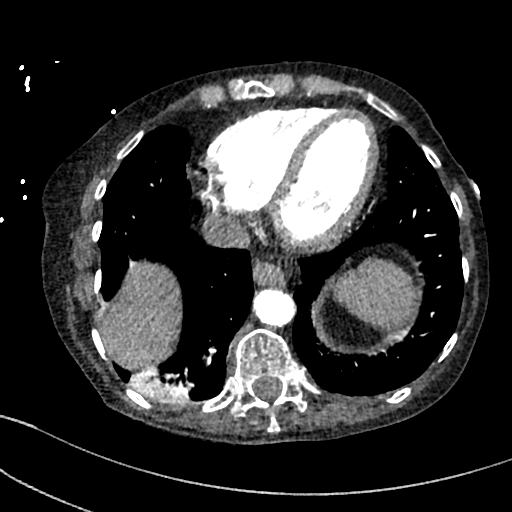
[im 154/395  lung]
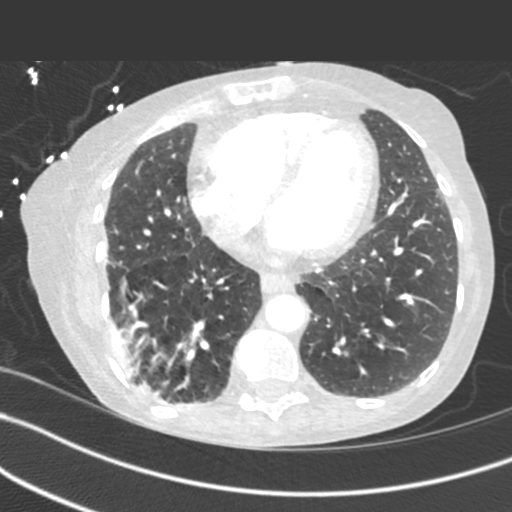
[im 176/395  soft-tissue]
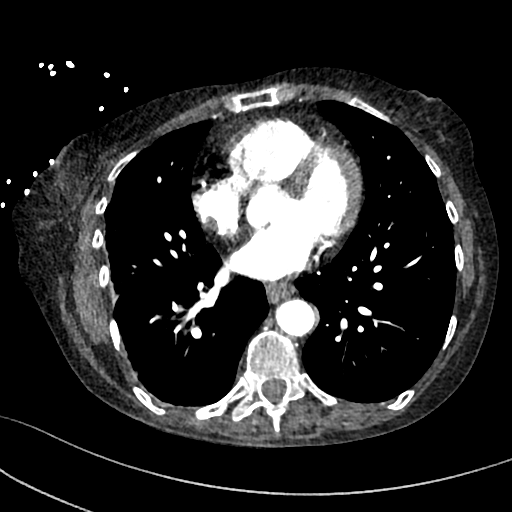
[im 219/395  lung]
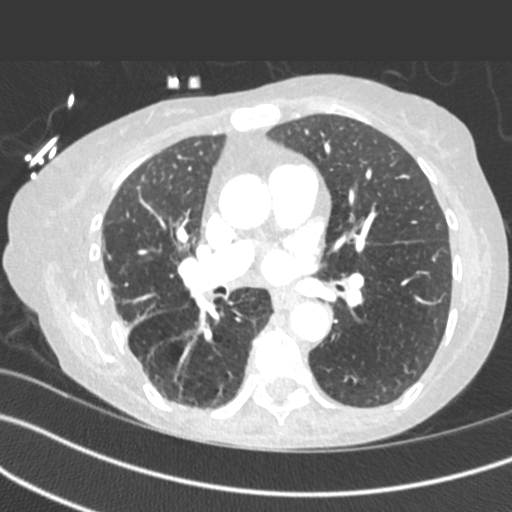
[im 241/395  soft-tissue]
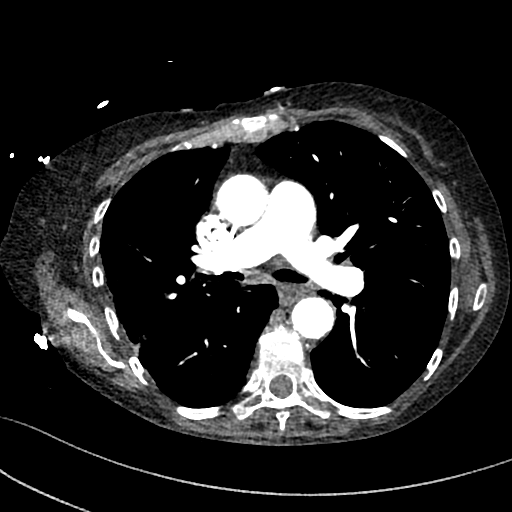
[im 263/395  lung]
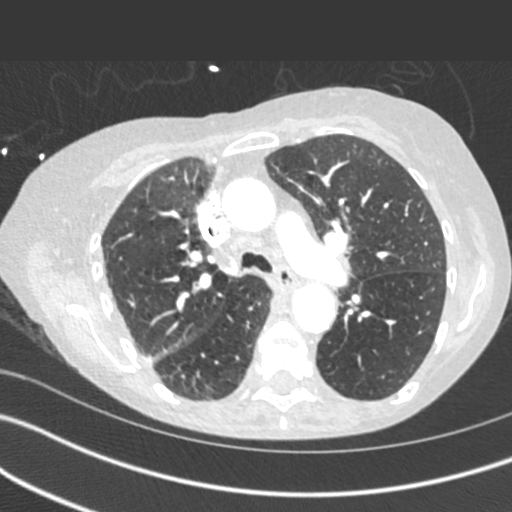
[im 285/395  soft-tissue]
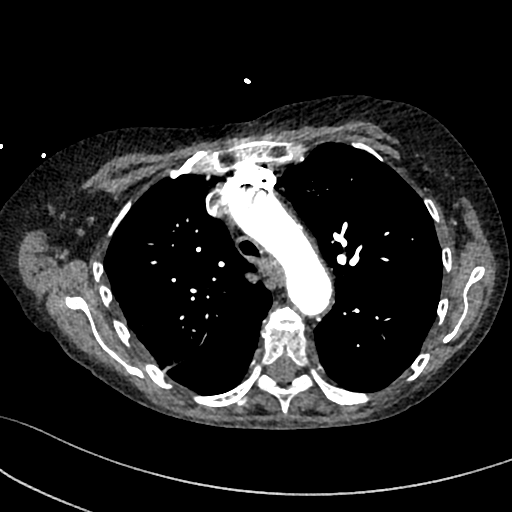
[im 307/395  lung]
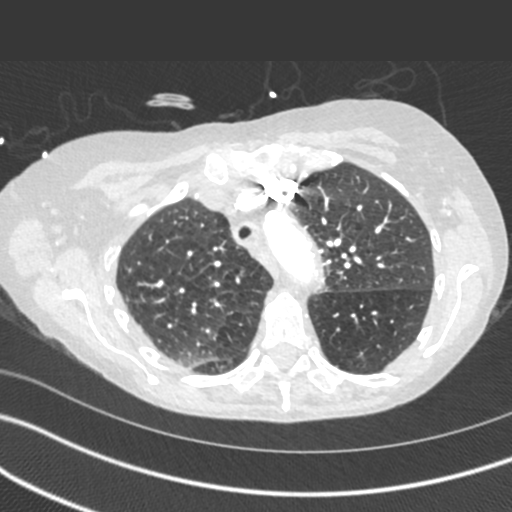
[im 329/395  soft-tissue]
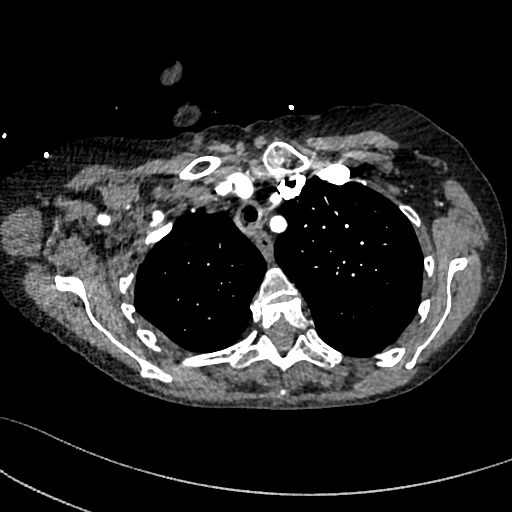
[im 351/395  lung]
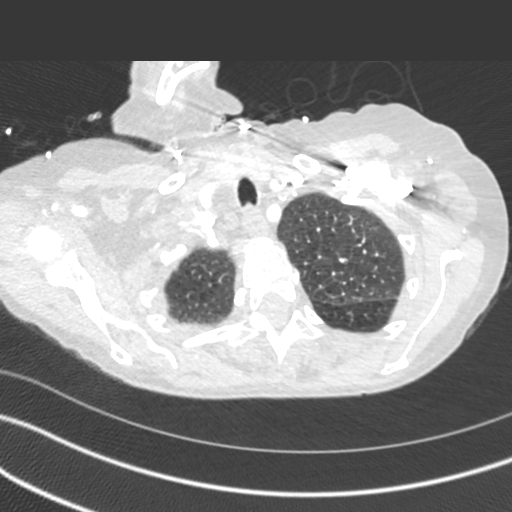
[im 373/395  soft-tissue]
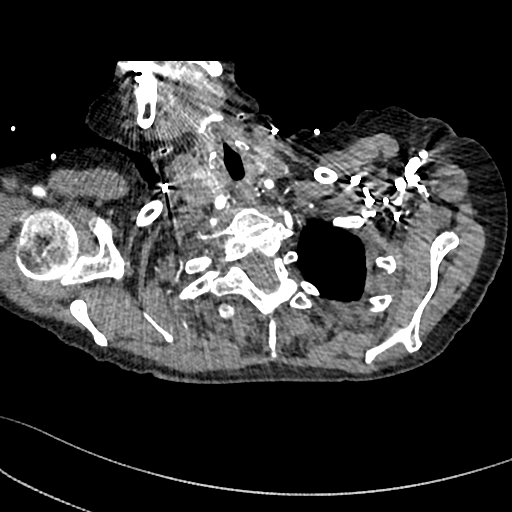

[Series 8: cor · coronal · 0.57mm/px · 3 of 135 slices shown]
[im 34/135  soft-tissue]
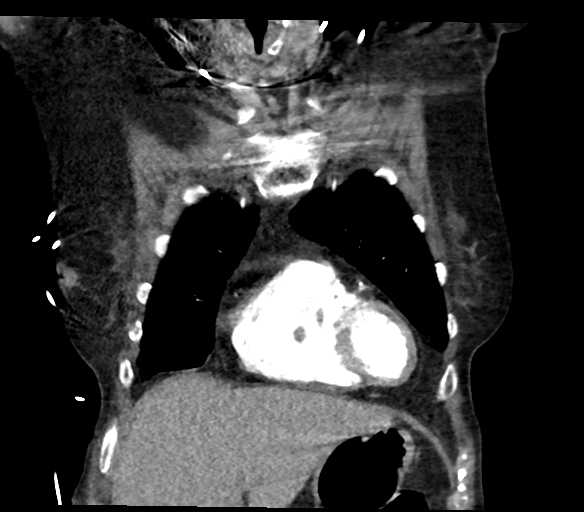
[im 68/135  soft-tissue]
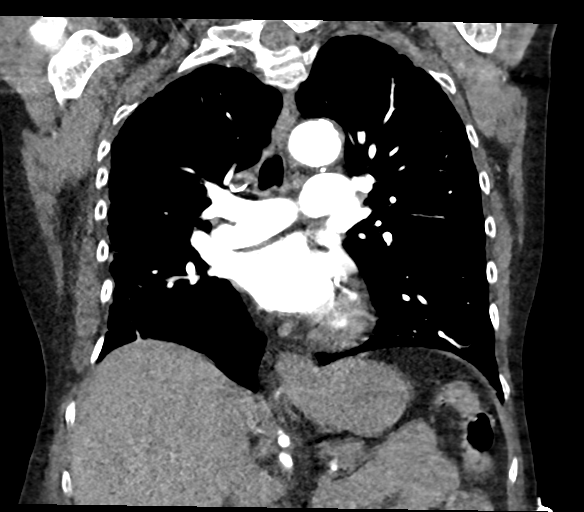
[im 101/135  soft-tissue]
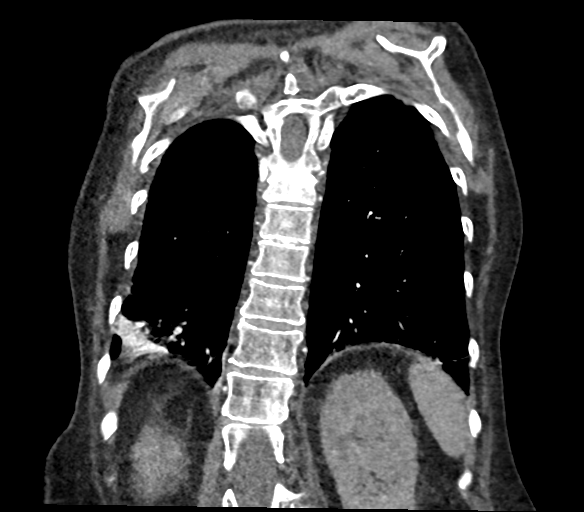

[19 of 46 positions shown; findings below may reference images not displayed]

FINDINGS: Cardiovascular: No filling defects in the pulmonary arteries to
suggest pulmonary emboli. Heart is normal size. Aorta is normal
caliber. Coronary artery and aortic calcifications.

Mediastinum/Nodes: No mediastinal, hilar, or axillary adenopathy.
Trachea and esophagus are unremarkable. Thyroid unremarkable.

Lungs/Pleura: Mild emphysema. Patchy opacity at the right lung base
could reflect atelectasis or infiltrate/pneumonia. Linear
atelectasis at the left base. No effusions.

Upper Abdomen: Imaging into the upper abdomen demonstrates no acute
findings.

Musculoskeletal: Compression deformity at T8 and through the
superior endplate of T6.

Review of the MIP images confirms the above findings.
IMPRESSION: No evidence of pulmonary embolus.

Right lower lobe atelectasis or infiltrate.

Compression fracture at T8 and the superior endplate of T6.

Coronary artery disease.

Aortic Atherosclerosis ([id number]) and Emphysema ([id number]).

## 2023-03-13 IMAGING — CR DG RIBS W/ CHEST 3+V*L*
3 series · 3 of 3 positions shown · non-contrast
Comparison: Chest x-ray May 24, 2021

CLINICAL DATA: Left rib pain and shortness of breath since fall 2
weeks ago.

EXAM:
LEFT RIBS AND CHEST - 3+ VIEW

[rib pa obl]
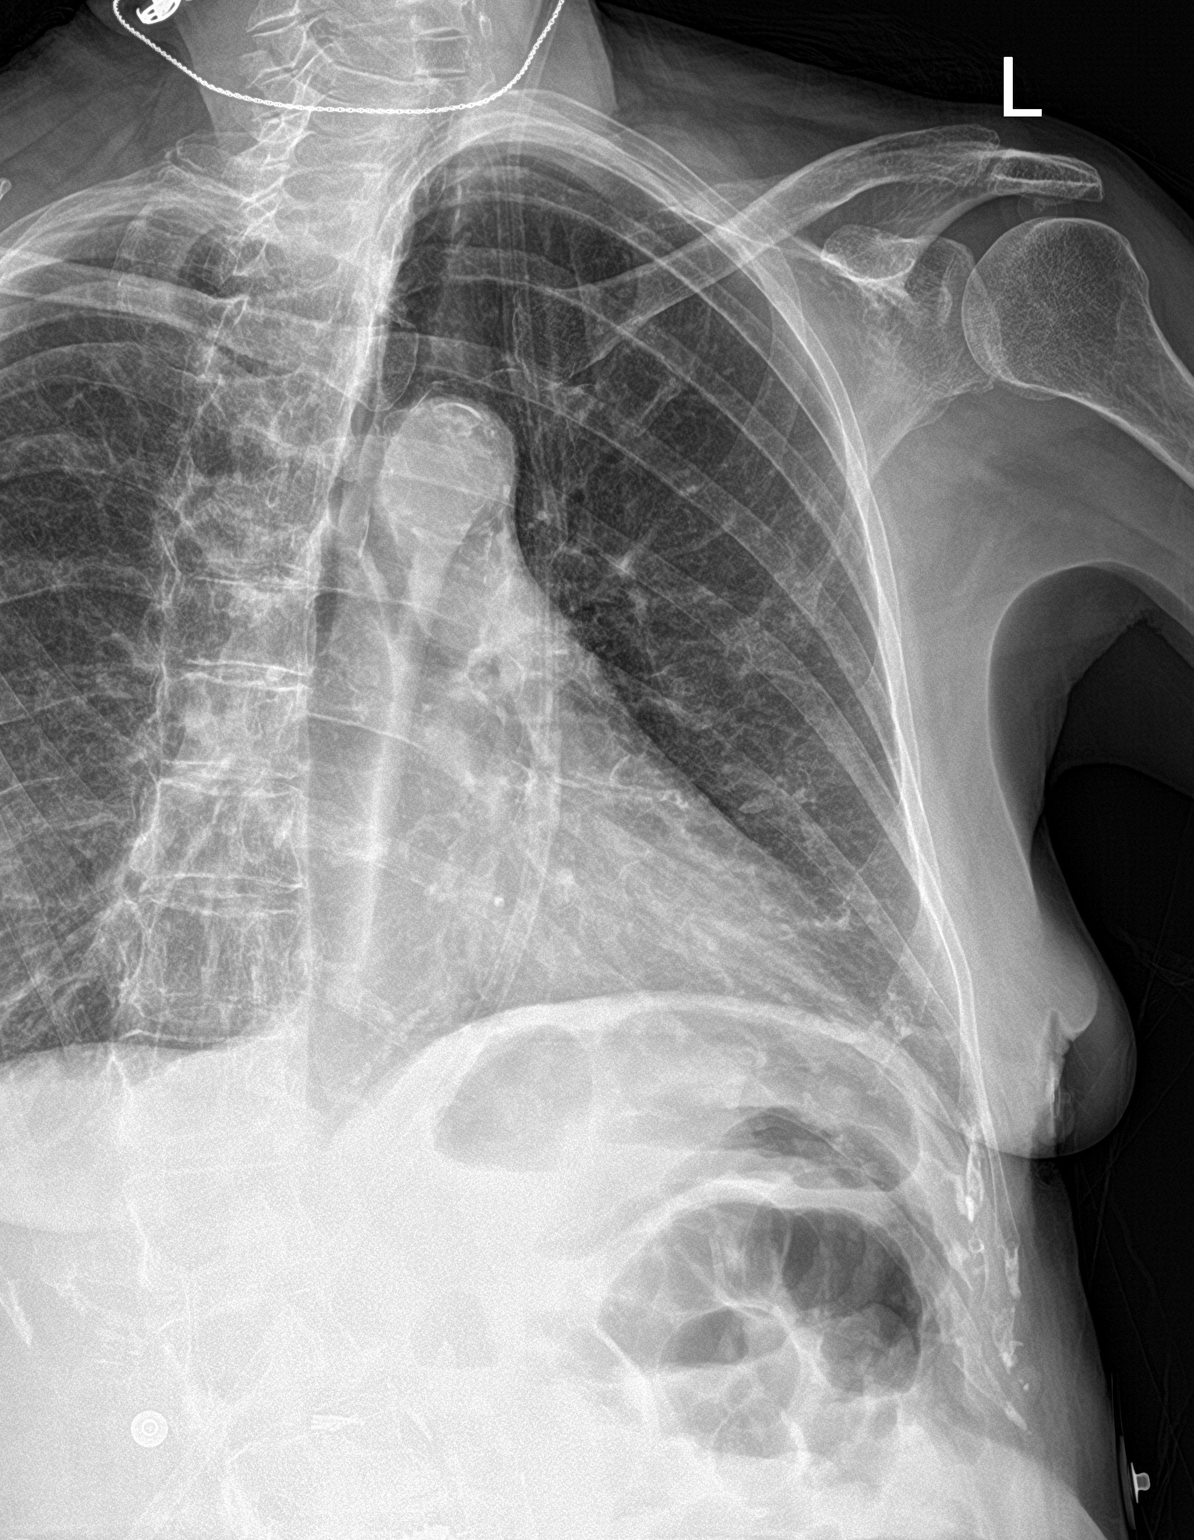

[rib pa]
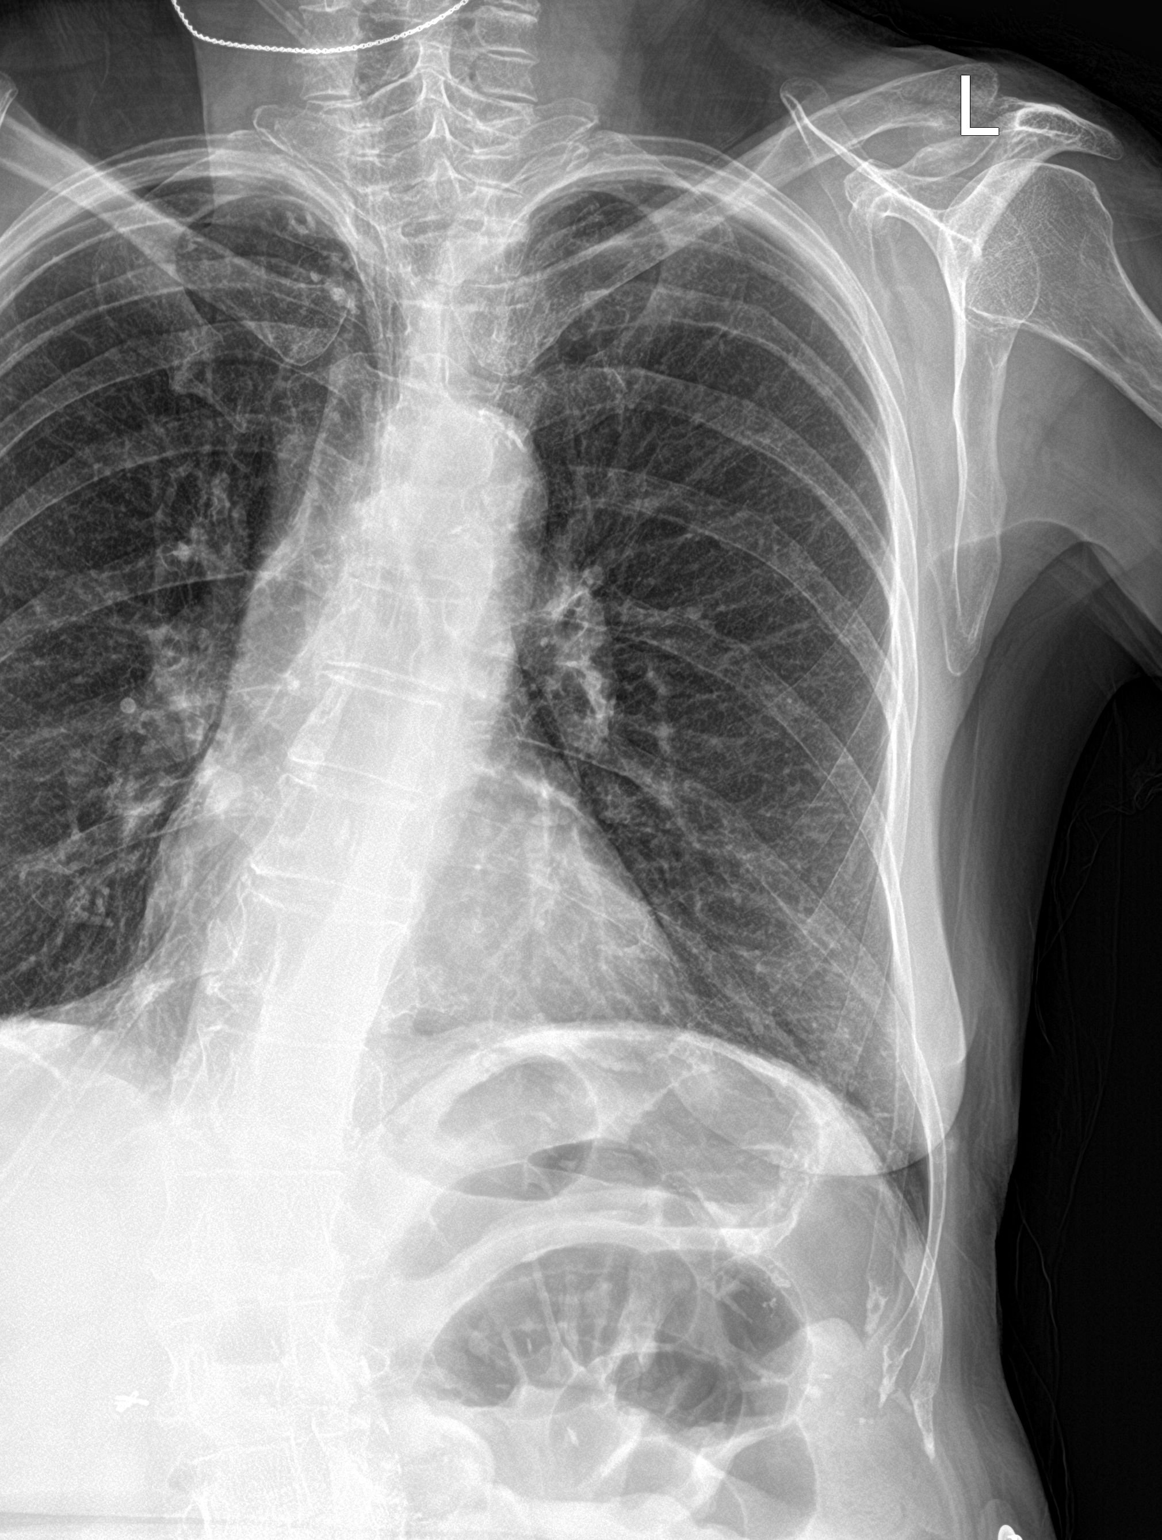

[chest pa]
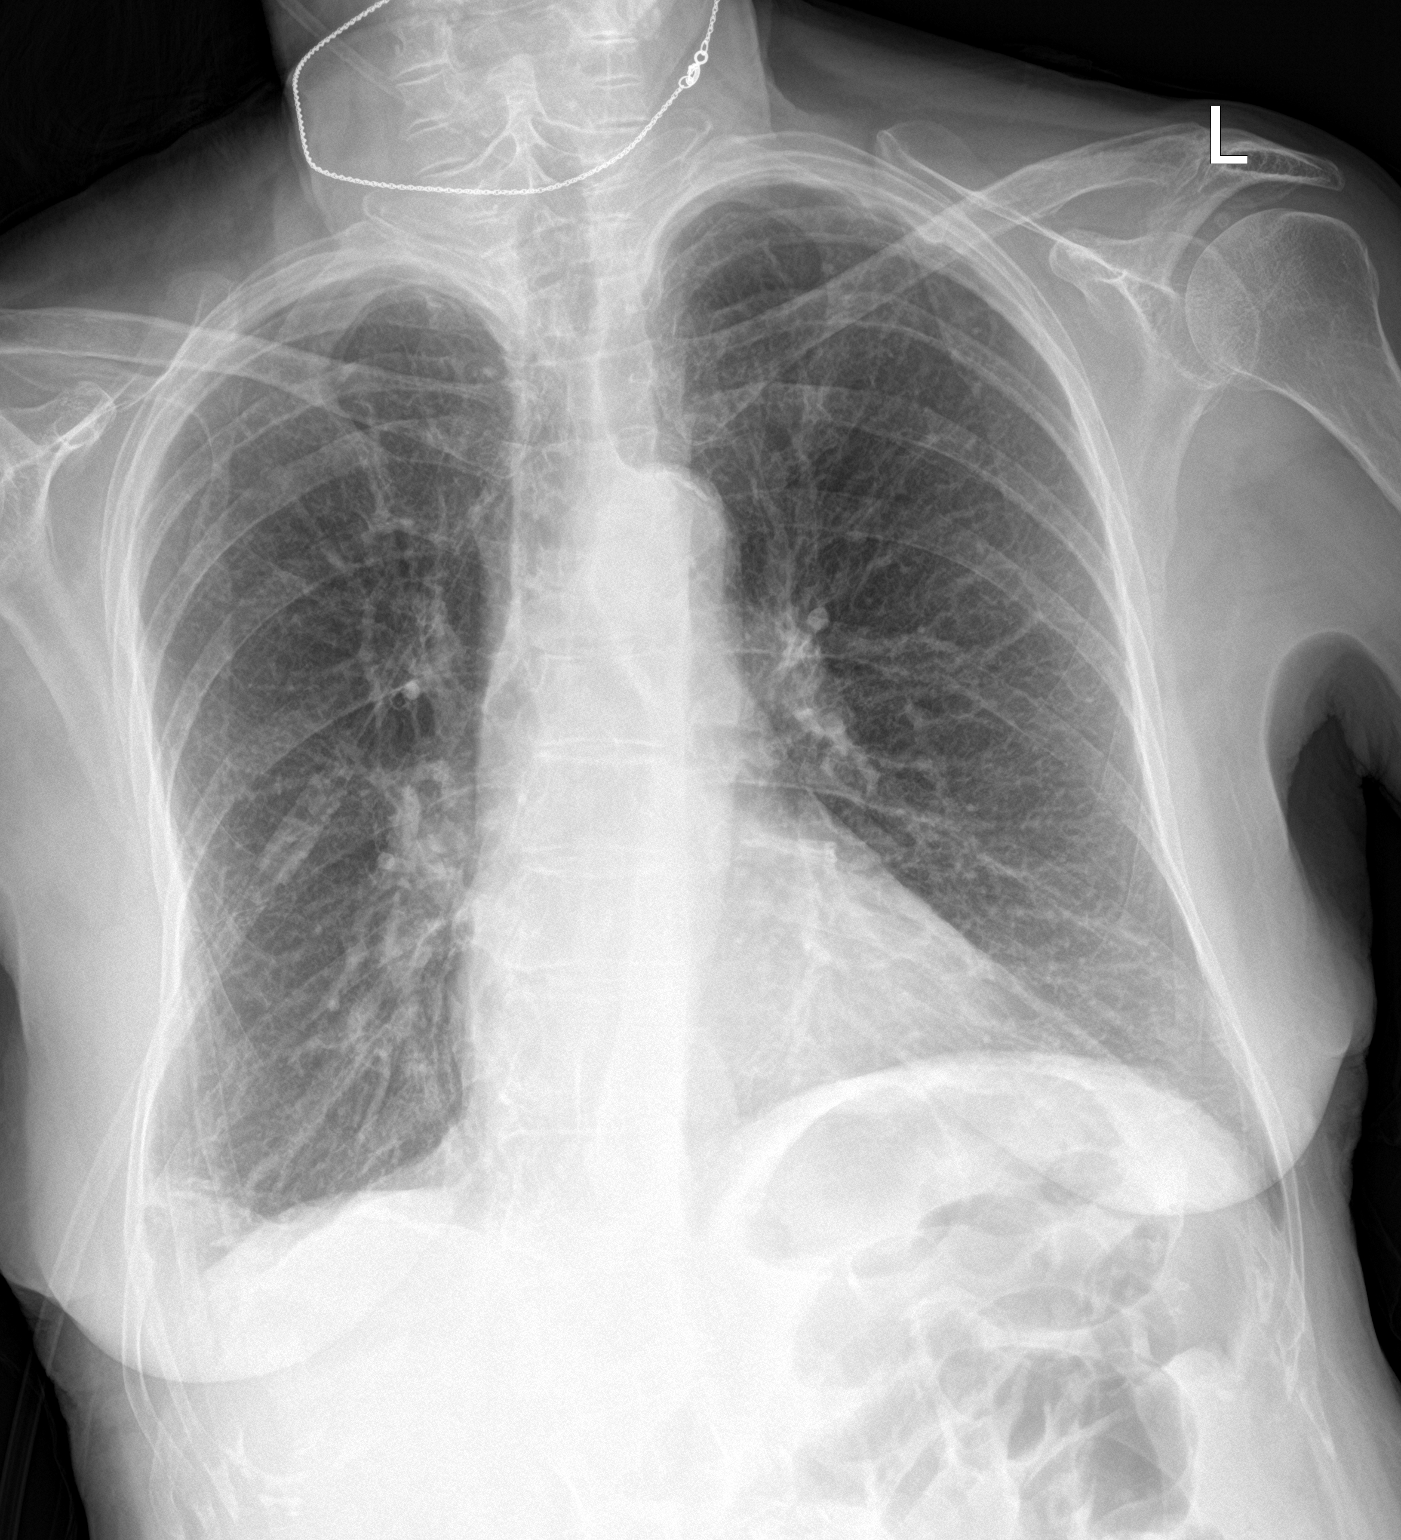

[3 of 3 positions shown; findings below may reference images not displayed]

FINDINGS: No rib fractures identified. No pneumothorax. Mild opacity in the
lateral right lung base was not seen on the May 16, 2021 comparison.
No other acute abnormalities.
IMPRESSION: 1. No cause for left-sided pain identified. No rib lesions or
fractures noted.
2. Mild opacity in the lateral right lung base may represent
atelectasis or infiltrate. Recommend clinical correlation and
follow-up imaging to ensure resolution.

## 2023-03-13 IMAGING — CT CT HEAD W/O CM
3 series · 16 of 47 positions shown, 19 images · non-contrast
Comparison: February 11, 2021

CLINICAL DATA: Altered mental status.

EXAM:
CT HEAD WITHOUT CONTRAST
TECHNIQUE: Contiguous axial images were obtained from the base of the skull
through the vertex without intravenous contrast.

[Series 3: head 5.0 h30s · axial · 0.40mm/px · z∈[-34,+96]mm · 10 of 32 slices shown, 13 images]
[im 3/32  brain]
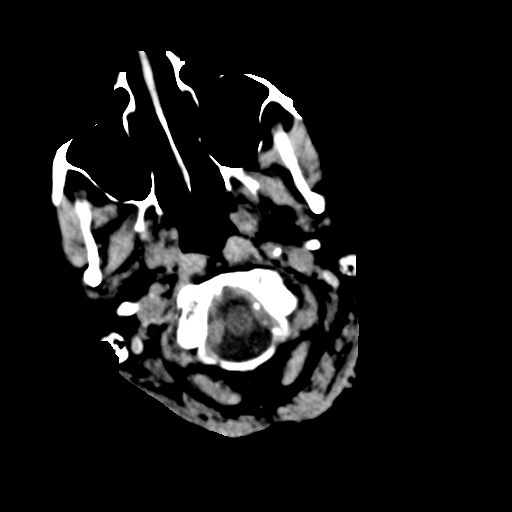
[im 3/32  bone]
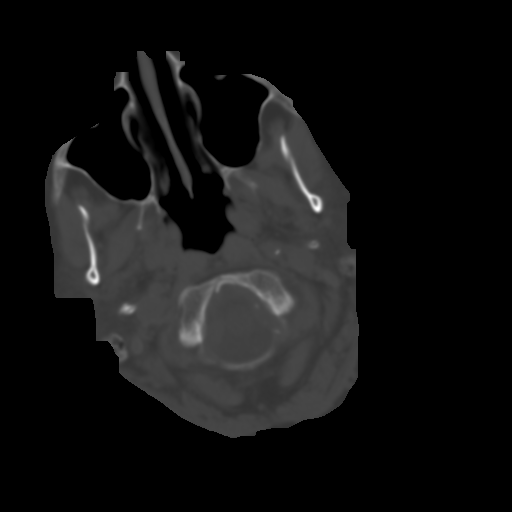
[im 6/32  brain]
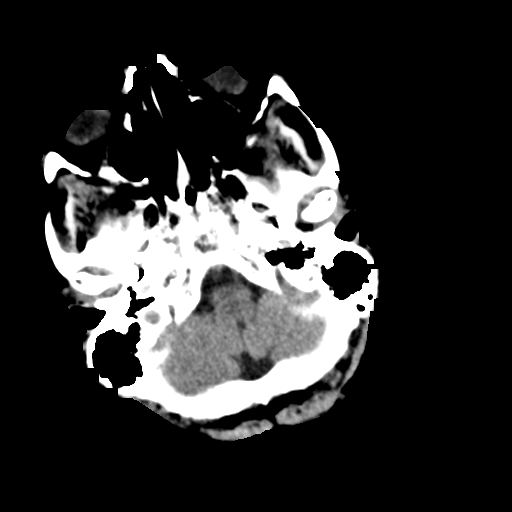
[im 9/32  brain]
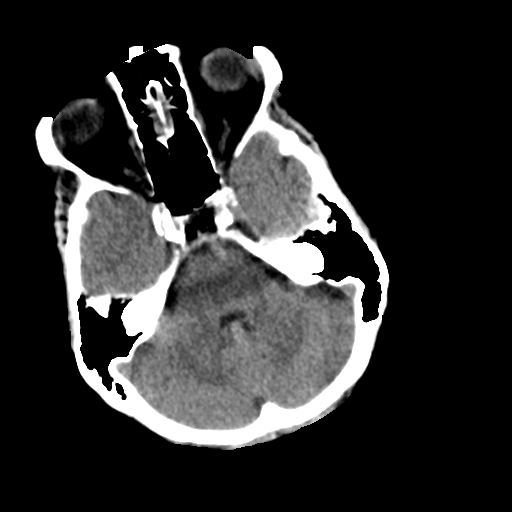
[im 11/32  brain]
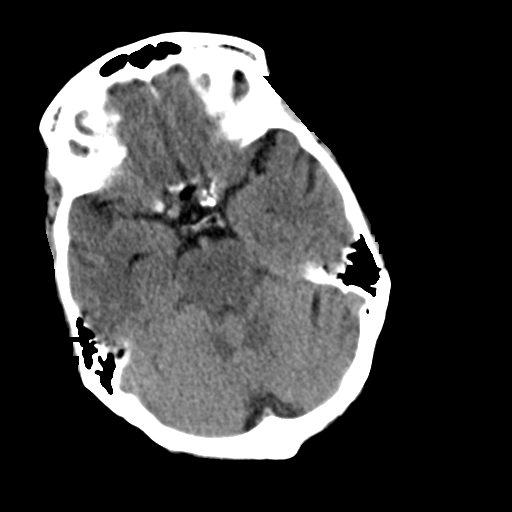
[im 14/32  brain]
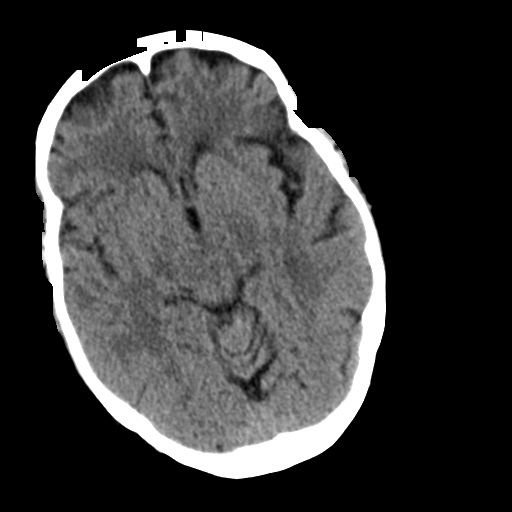
[im 14/32  bone]
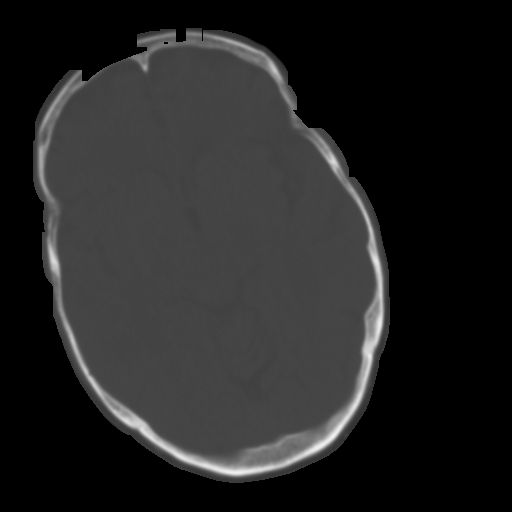
[im 18/32  brain]
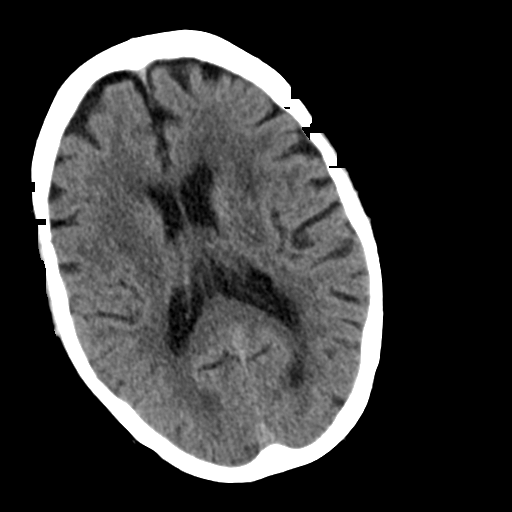
[im 21/32  brain]
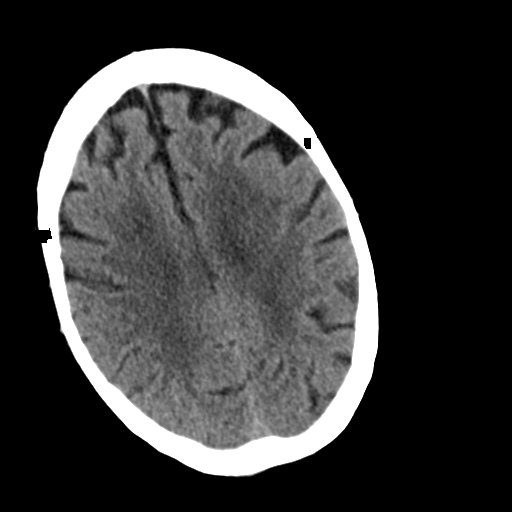
[im 24/32  brain]
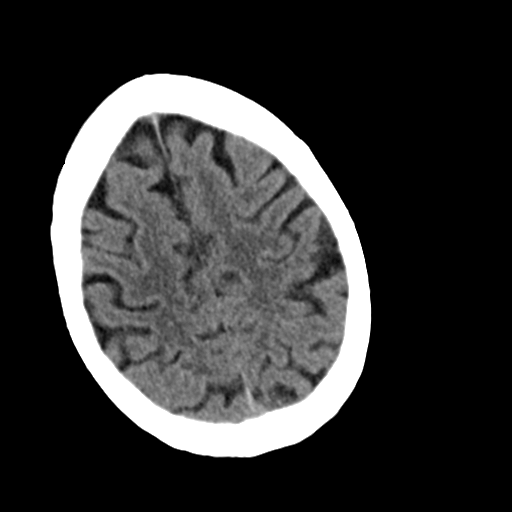
[im 26/32  brain]
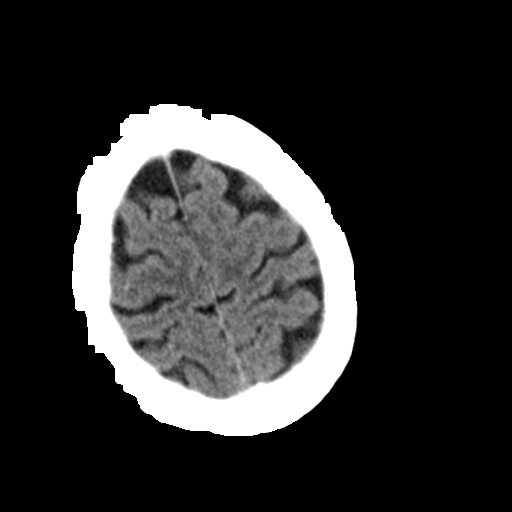
[im 26/32  bone]
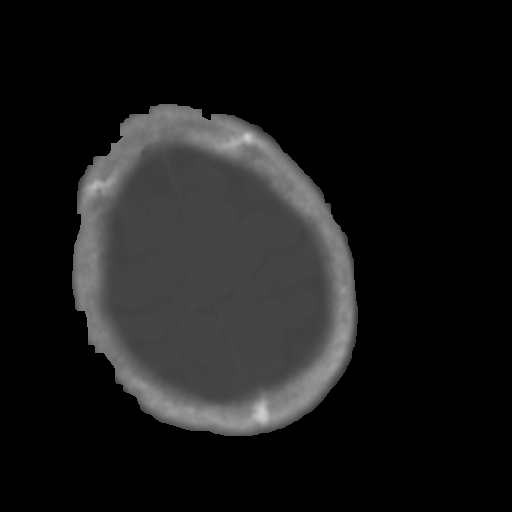
[im 29/32  brain]
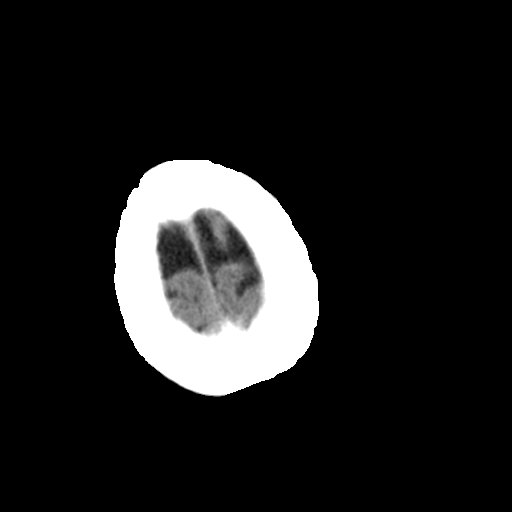

[Series 5: head 3.0 mpr cor · coronal · 0.31mm/px · 3 of 66 slices shown]
[im 22/66  brain]
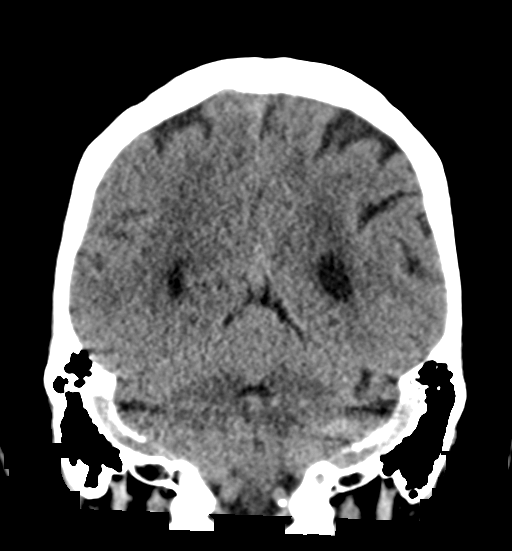
[im 29/66  brain]
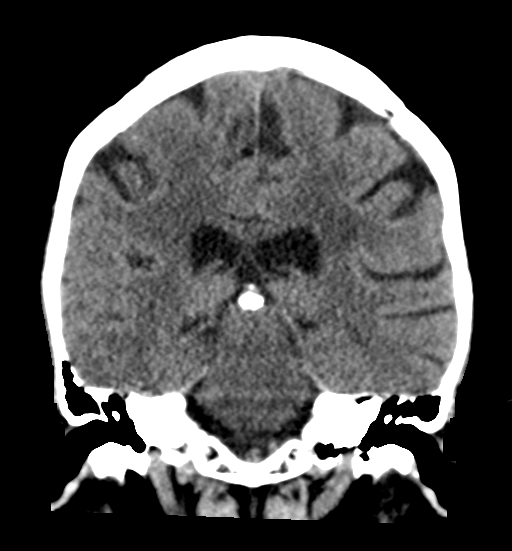
[im 37/66  brain]
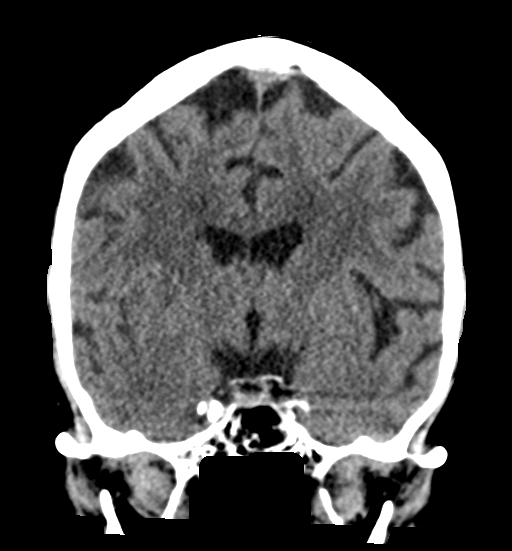

[Series 6: head 3.0 mpr sag · sagittal · 0.33mm/px · 3 of 52 slices shown]
[im 18/52  brain]
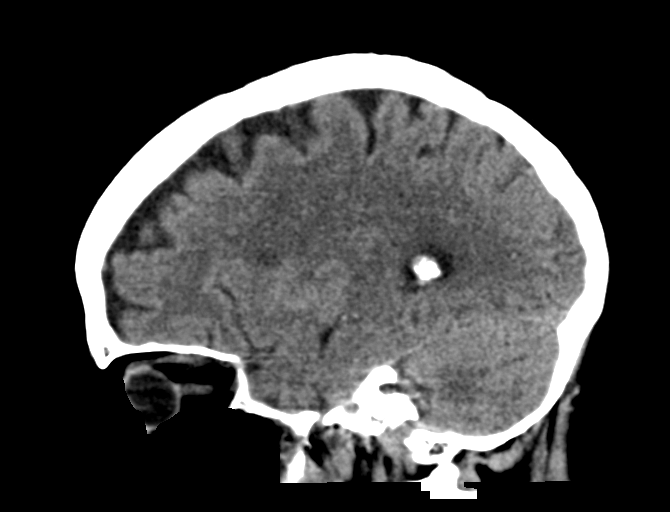
[im 26/52  brain]
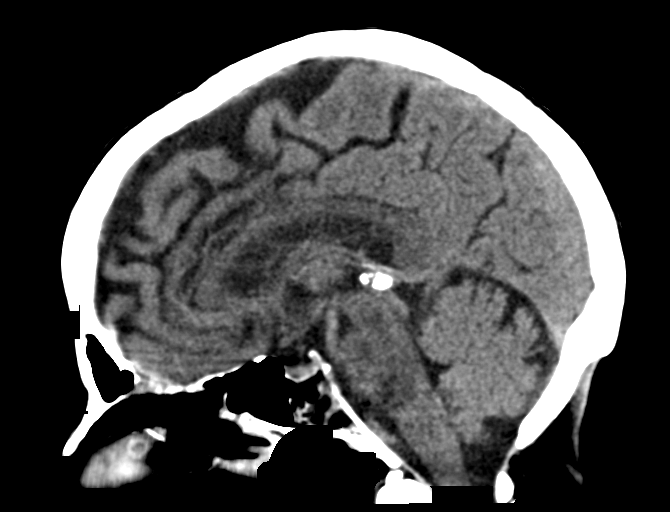
[im 35/52  brain]
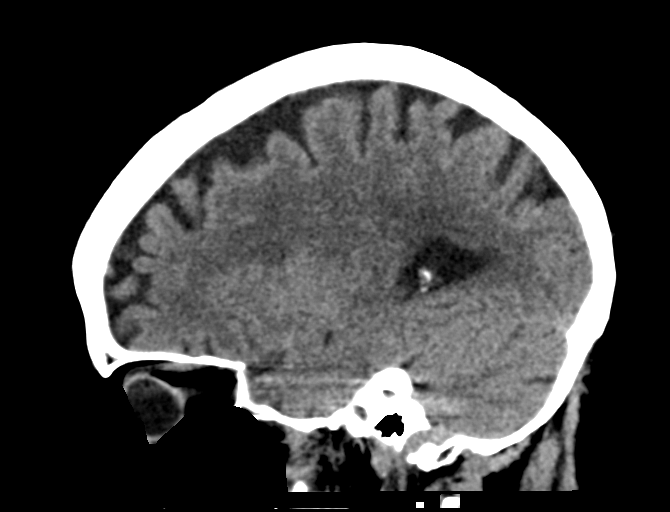

[16 of 47 positions shown; findings below may reference images not displayed]

FINDINGS: Brain: There is mild cerebral atrophy with widening of the
extra-axial spaces and ventricular dilatation.
There are areas of decreased attenuation within the white matter
tracts of the supratentorial brain, consistent with microvascular
disease changes.

Vascular: No hyperdense vessel or unexpected calcification.

Skull: Normal. Negative for fracture or focal lesion.

Sinuses/Orbits: No acute finding.

Other: None.
IMPRESSION: 1. Generalized cerebral atrophy.
2. No acute intracranial abnormality.

## 2023-03-17 IMAGING — DX DG CHEST 2V
2 series · 2 of 2 positions shown · non-contrast
Comparison: June 23, 2021

CLINICAL DATA: Follow-up shortness of breath.

EXAM:
CHEST - 2 VIEW

[chest pa]
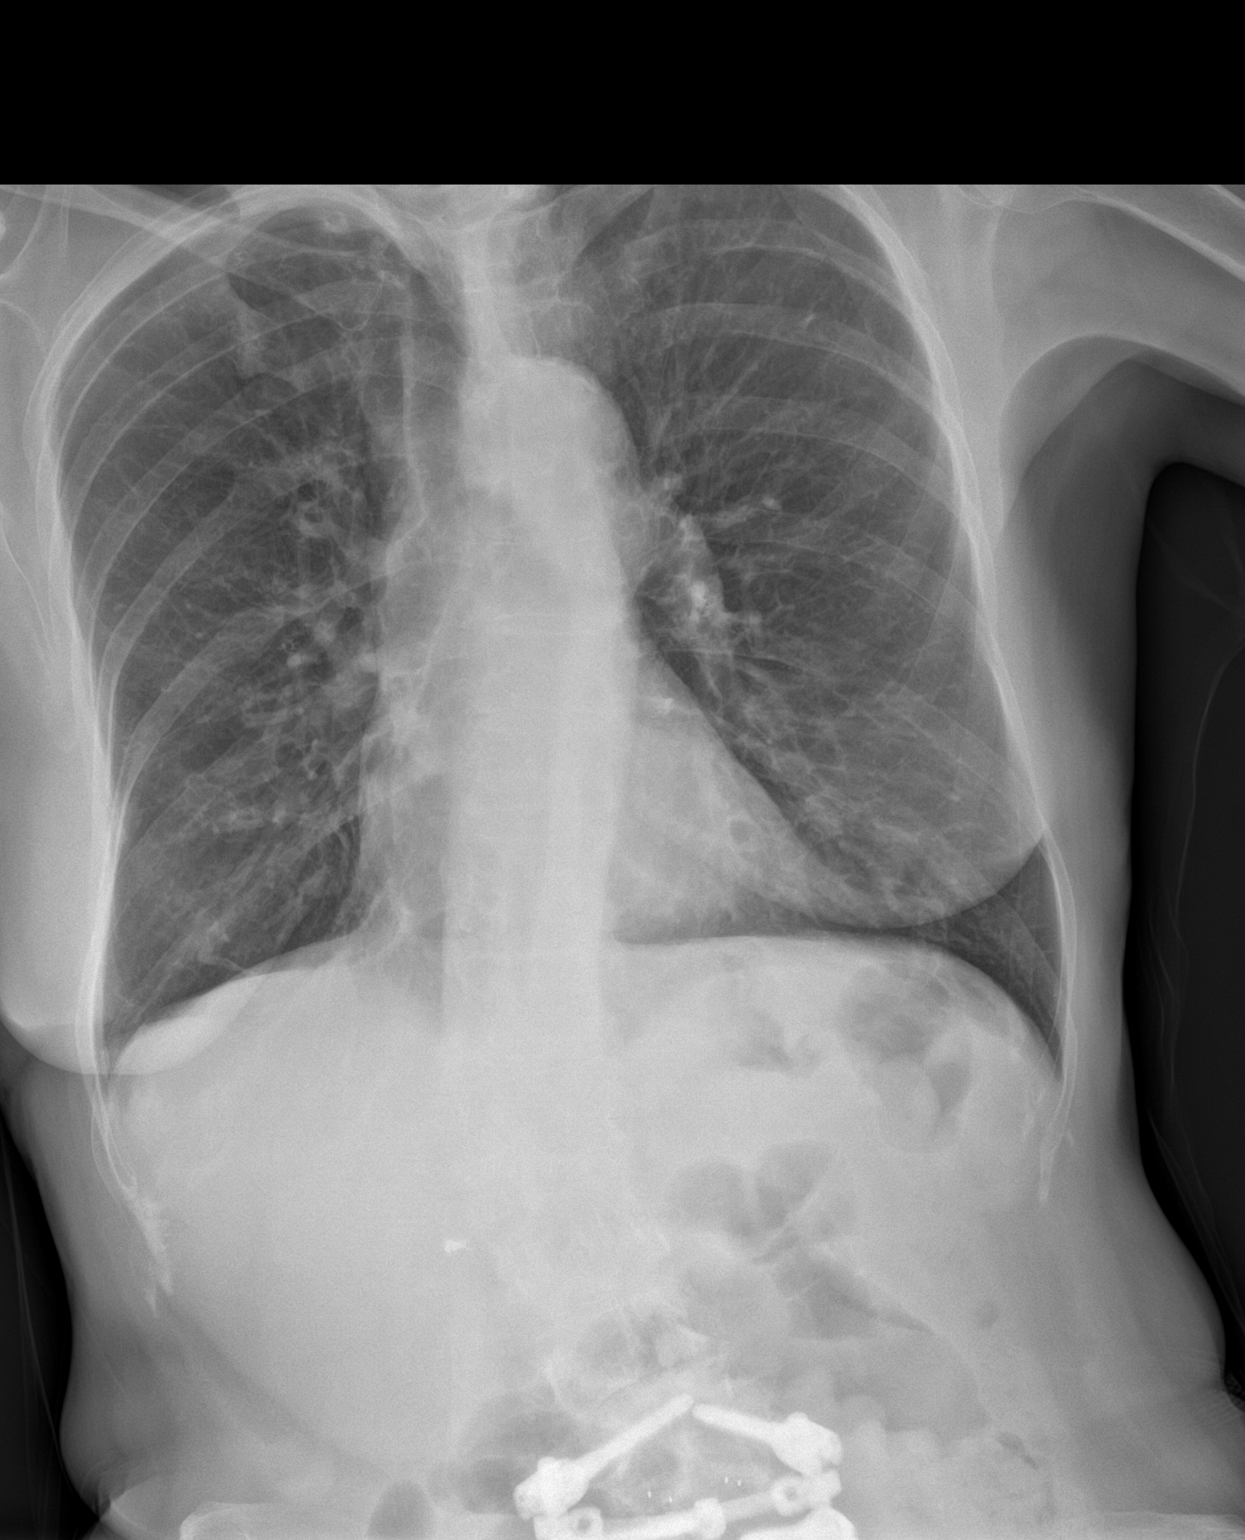

[chest lat]
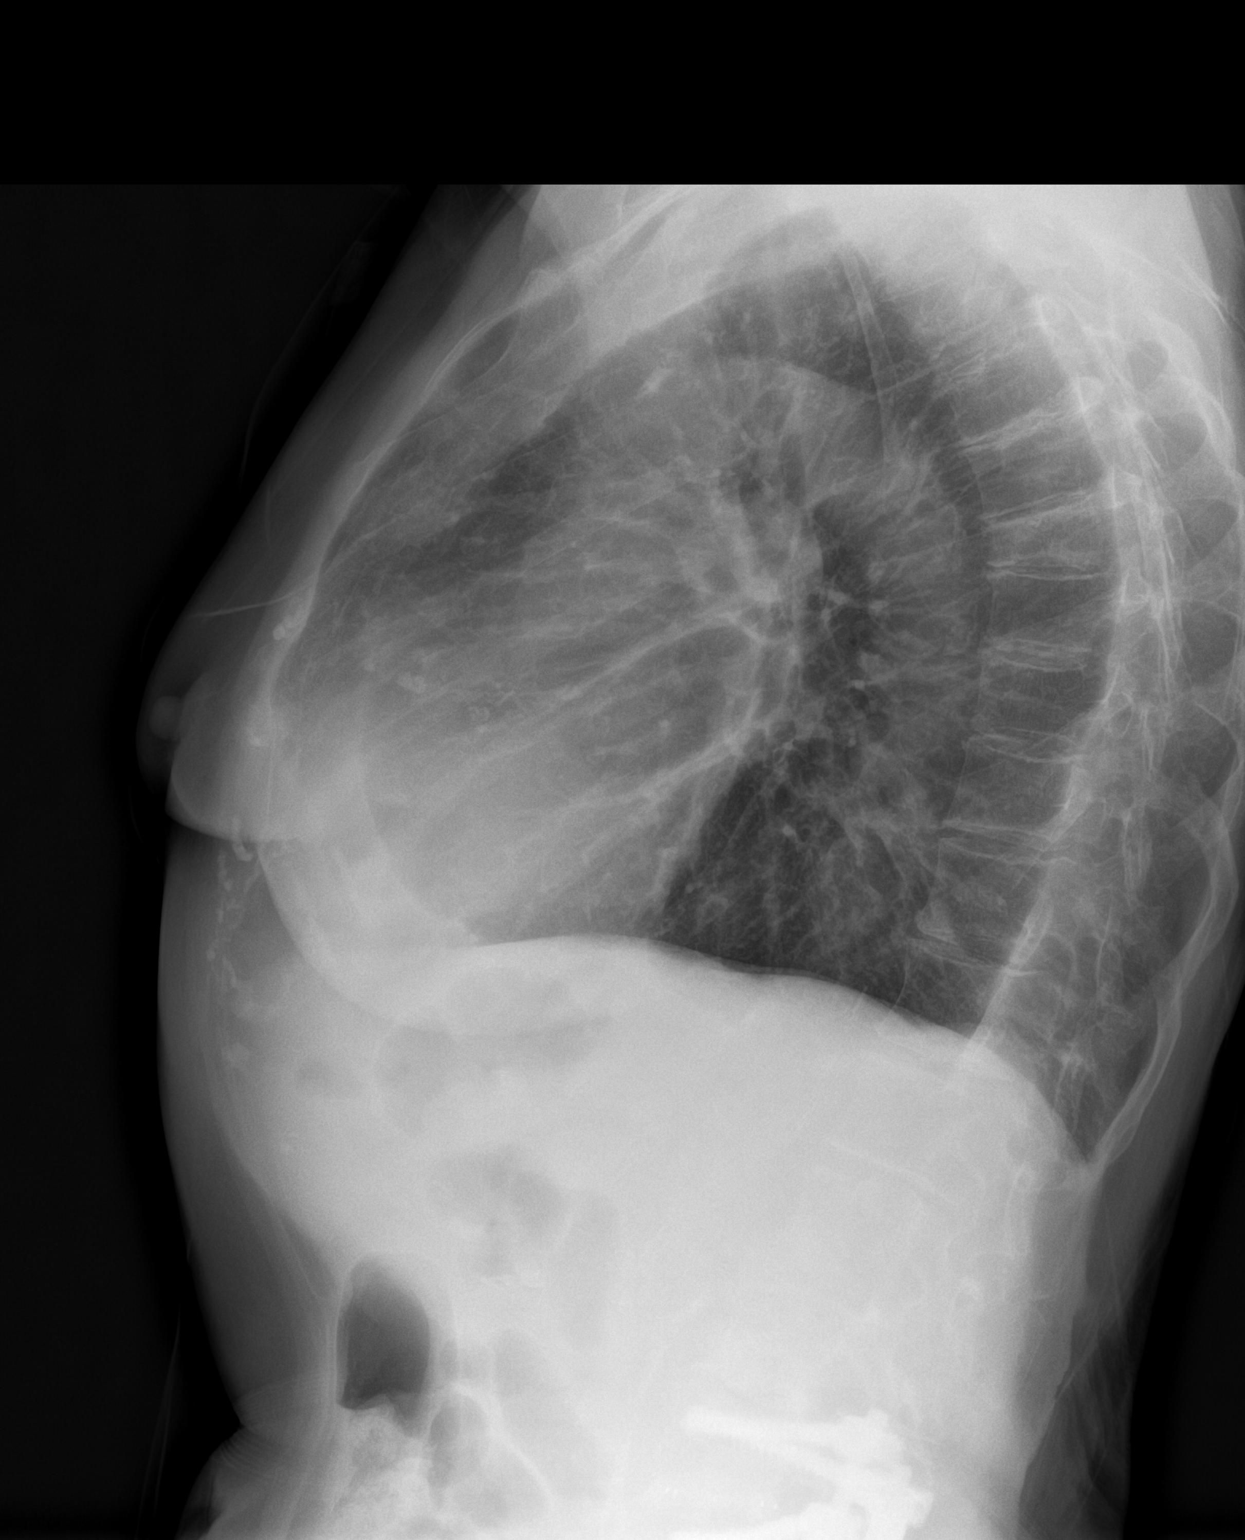

[2 of 2 positions shown; findings below may reference images not displayed]

FINDINGS: No pneumothorax. The heart, hila, and mediastinum are unremarkable.
No suspicious pulmonary nodules or masses. Bronchitic change seen.
Suggested bronchial wall thickening. No focal infiltrate or mass.
IMPRESSION: 1. Bronchitic change and suggesting bronchial wall thickening.
Findings could represent bronchitis. No focal infiltrate to suggest
pneumonia.

## 2023-07-07 IMAGING — DX DG CHEST 2V
2 series · 2 of 2 positions shown · non-contrast
Comparison: 06/26/2021

CLINICAL DATA: 70-year-old female with cough

EXAM:
CHEST - 2 VIEW

[chest pa]
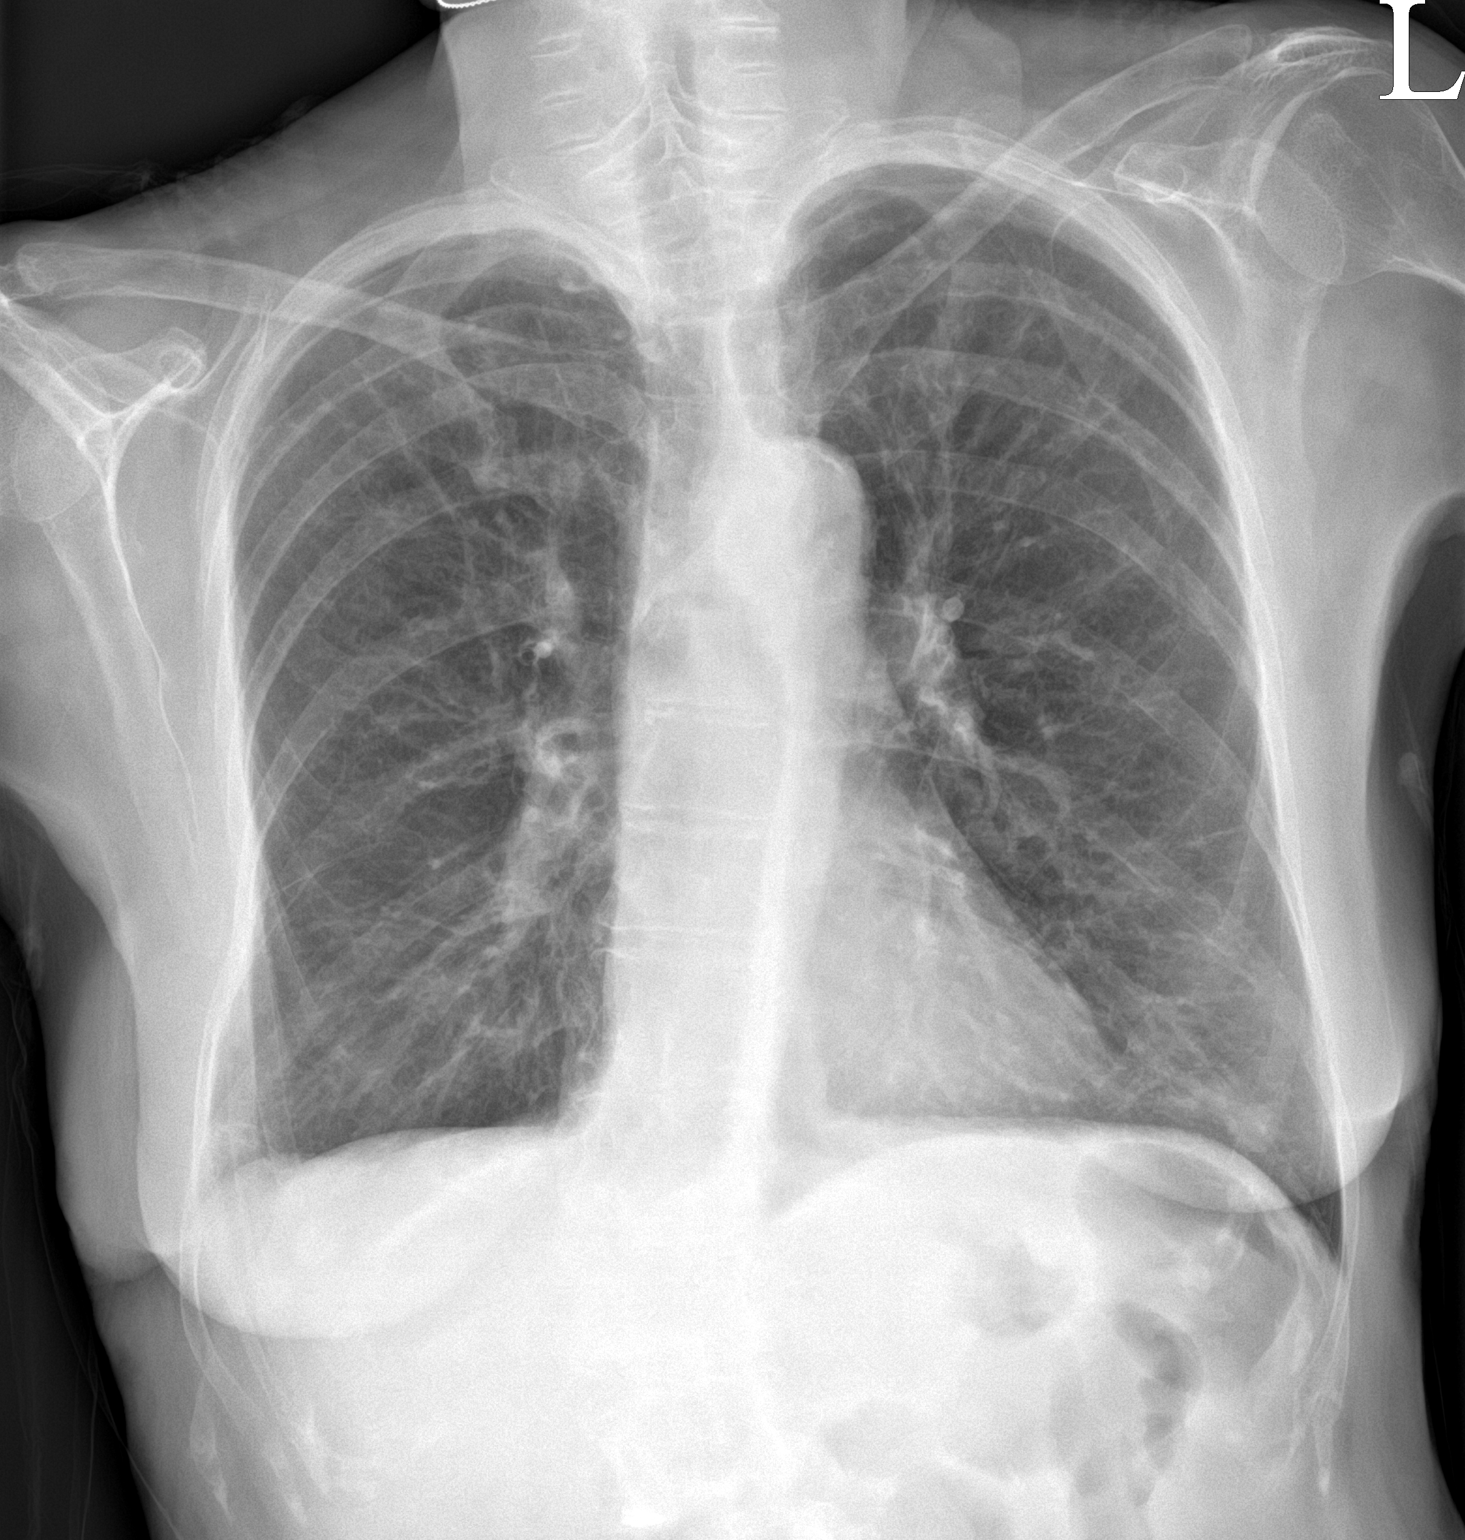

[chest lat]
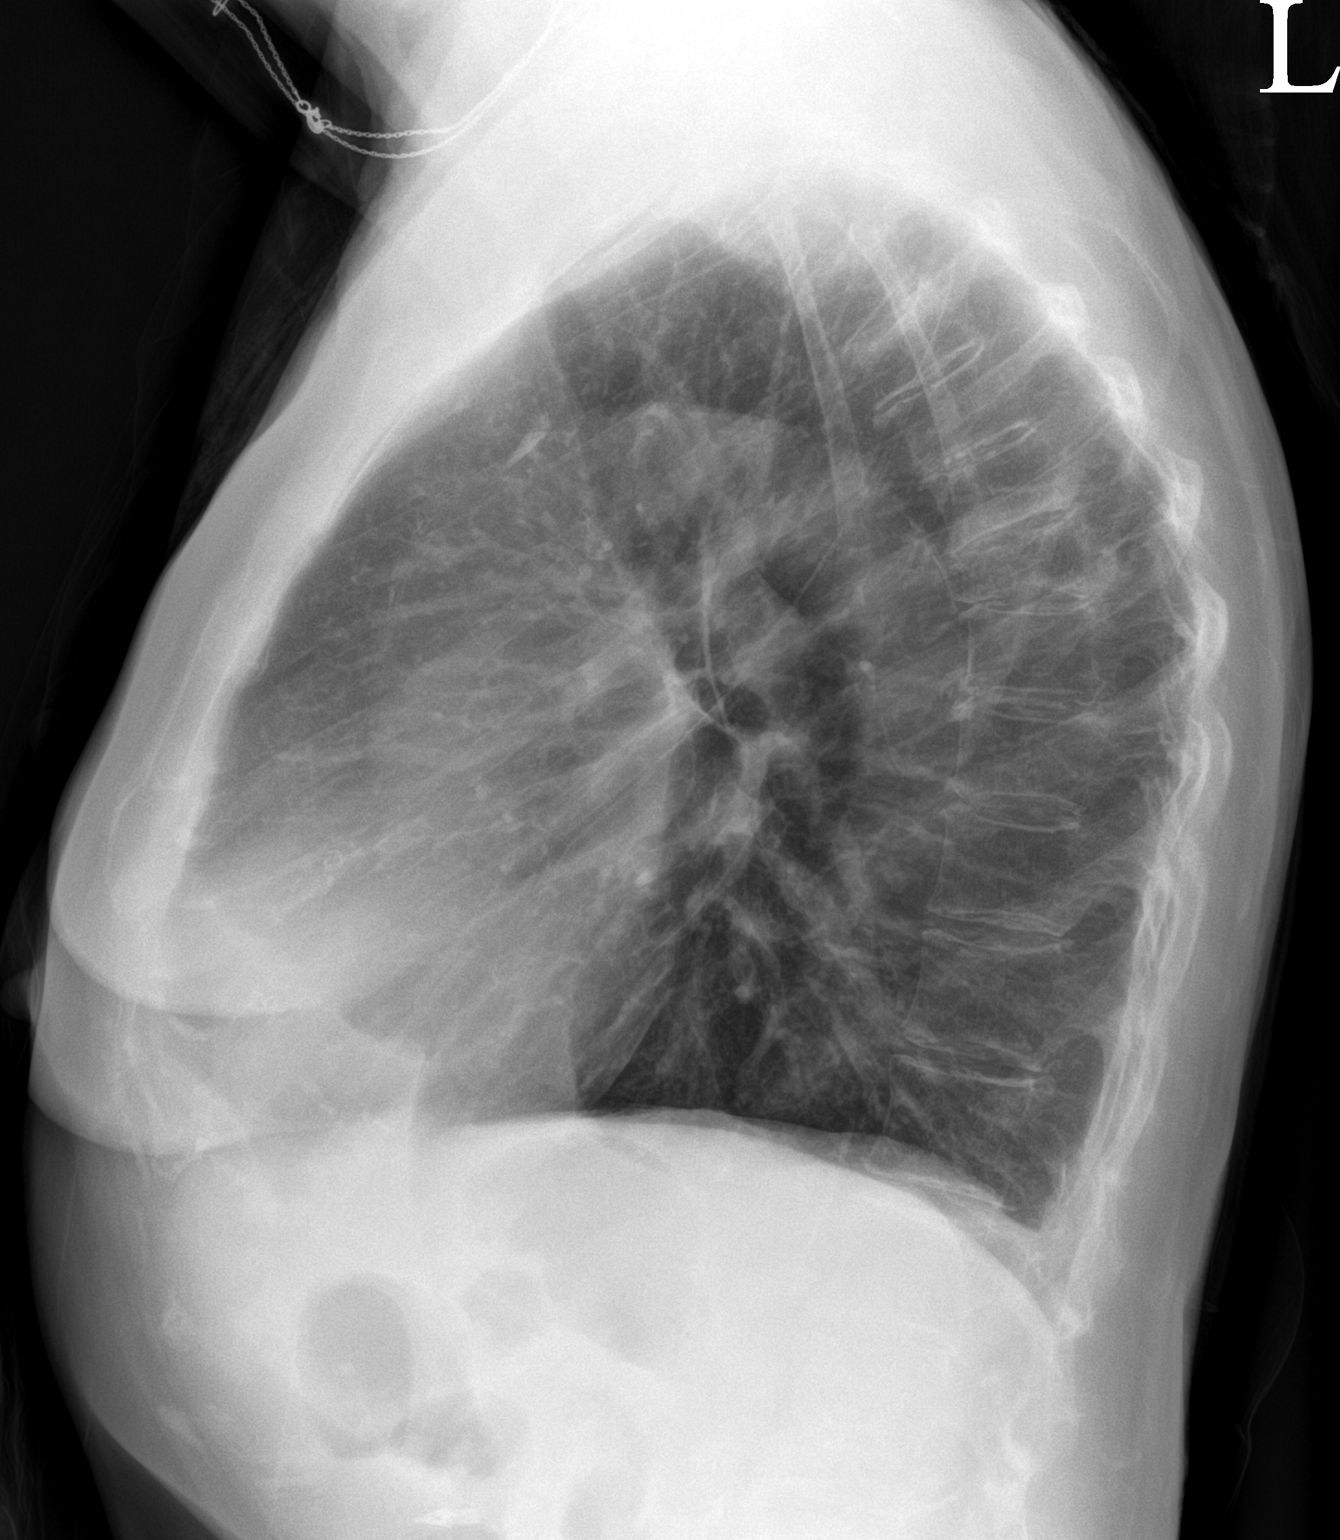

[2 of 2 positions shown; findings below may reference images not displayed]

FINDINGS: Cardiomediastinal silhouette unchanged in size and contour. No
evidence of central vascular congestion. No interlobular septal
thickening.

Stigmata of emphysema, with increased retrosternal airspace,
flattened hemidiaphragms, increased AP diameter, and hyperinflation
on the AP view.

Similar appearance of coarsened interstitial markings throughout.

Similar appearance of bronchiectasis and mild architectural
distortion.

No pneumothorax or pleural effusion. No new confluent airspace
disease

No acute displaced fracture. Degenerative changes of the spine.

Compression fracture in the midthoracic spine unchanged.
IMPRESSION: Chronic lung changes and emphysema without definite evidence of
acute cardiopulmonary disease

## 2023-07-25 IMAGING — DX DG CHEST 2V
2 series · 2 of 2 positions shown · non-contrast
Comparison: Chest CTA 06/22/2021. Radiographs 10/16/2021 and
earlier.

CLINICAL DATA: 70-year-old female with chest pain, cough and
shortness of breath onset this morning.

EXAM:
CHEST - 2 VIEW

[chest pa]
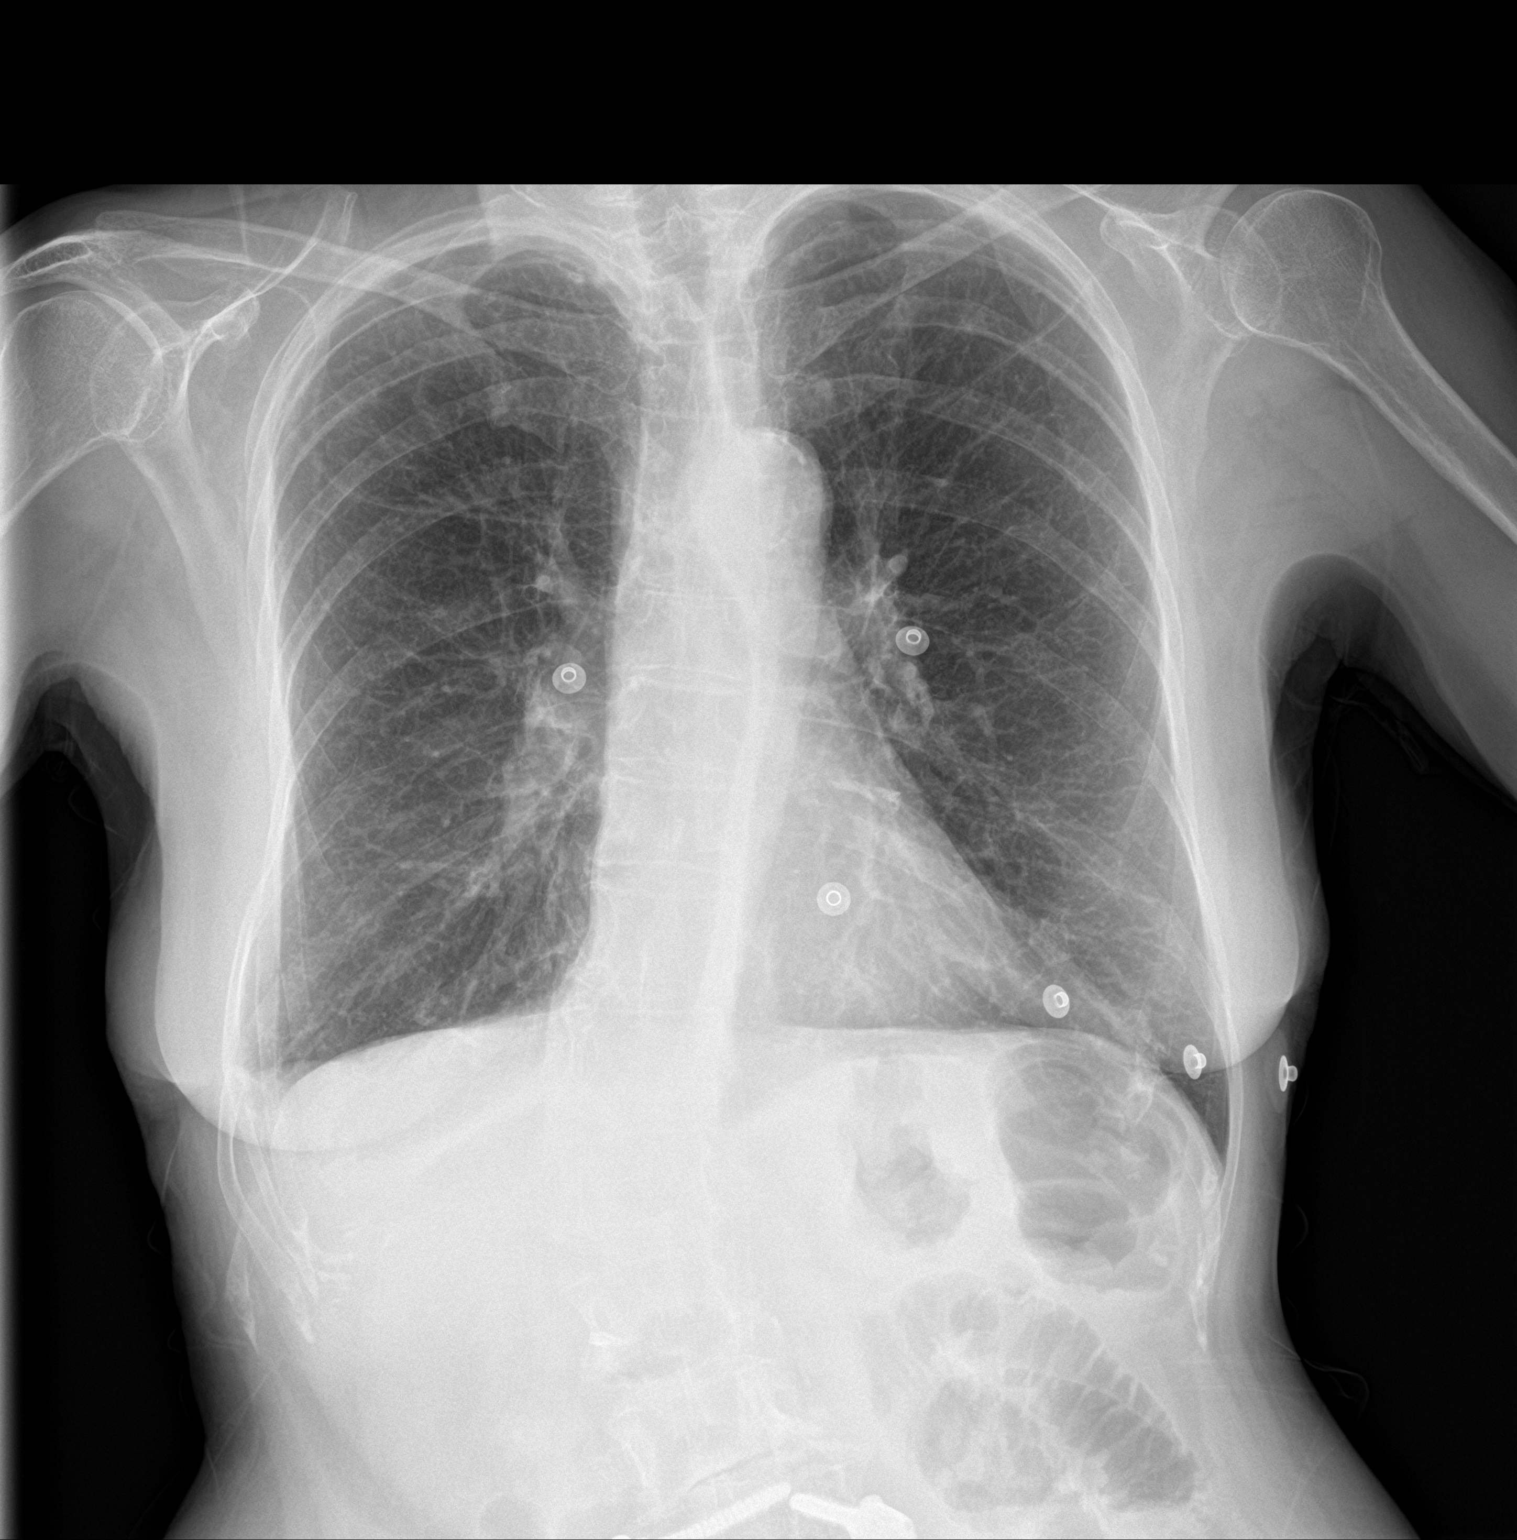

[chest lat]
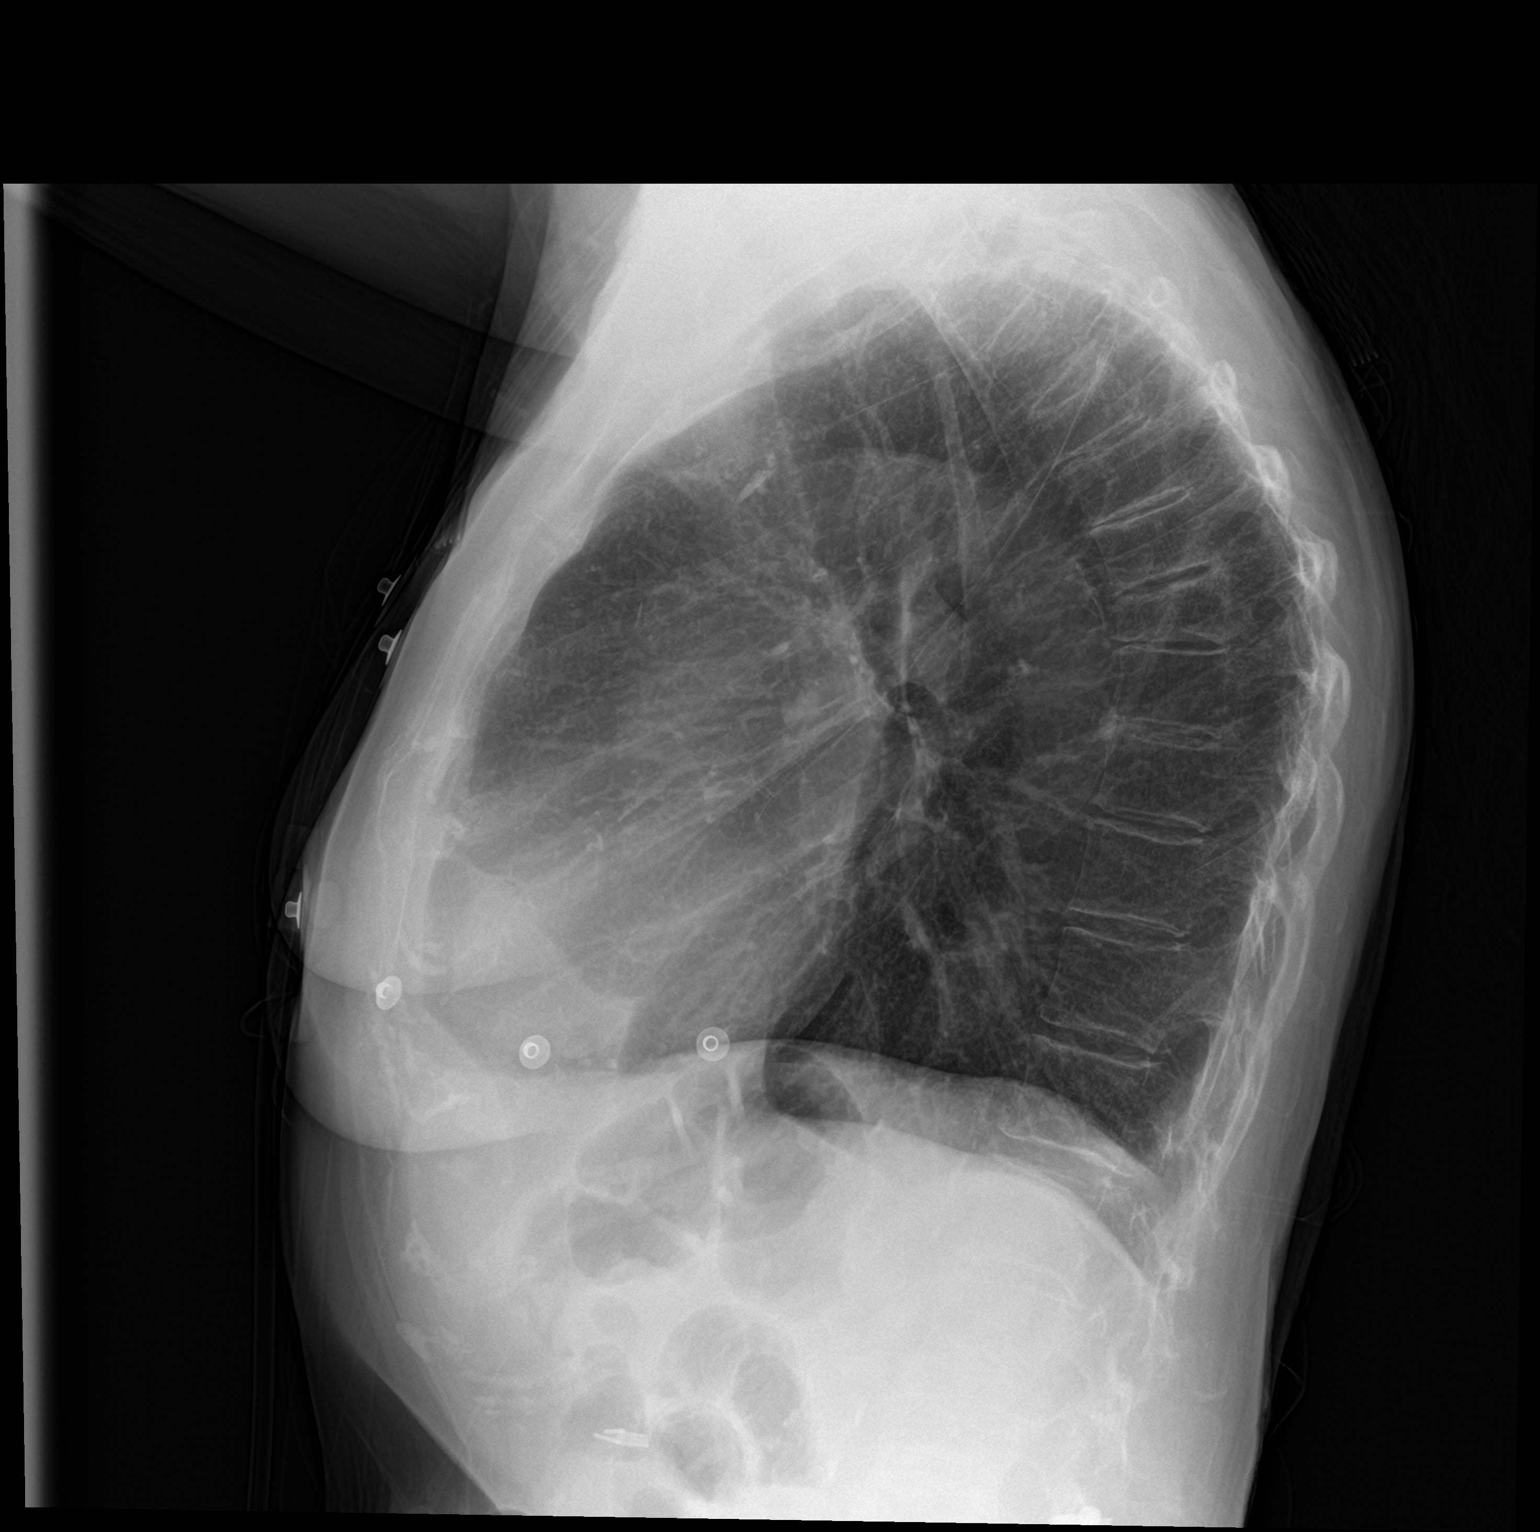

[2 of 2 positions shown; findings below may reference images not displayed]

FINDINGS: Chronic scoliosis and partially visible lumbar fusion hardware.
Chronic midthoracic compression fracture and osteopenia. No acute
osseous abnormality identified.

Stable large lung volumes. Mediastinal contours remain normal.
Visualized tracheal air column is within normal limits. Stable
mildly increased bilateral lung markings. Both lungs otherwise
appear clear. No pneumothorax or pleural effusion.

Calcified aortic atherosclerosis.  Stable cholecystectomy clips.
IMPRESSION: Chronic emphysema.  No acute cardiopulmonary abnormality.

## 2023-07-28 IMAGING — DX DG FOOT COMPLETE 3+V*R*
3 series · 3 of 3 positions shown · non-contrast
Comparison: None

CLINICAL DATA: dog stepped on foot, swelling pain bruising

EXAM:
RIGHT FOOT COMPLETE - 3+ VIEW

[foot ap]
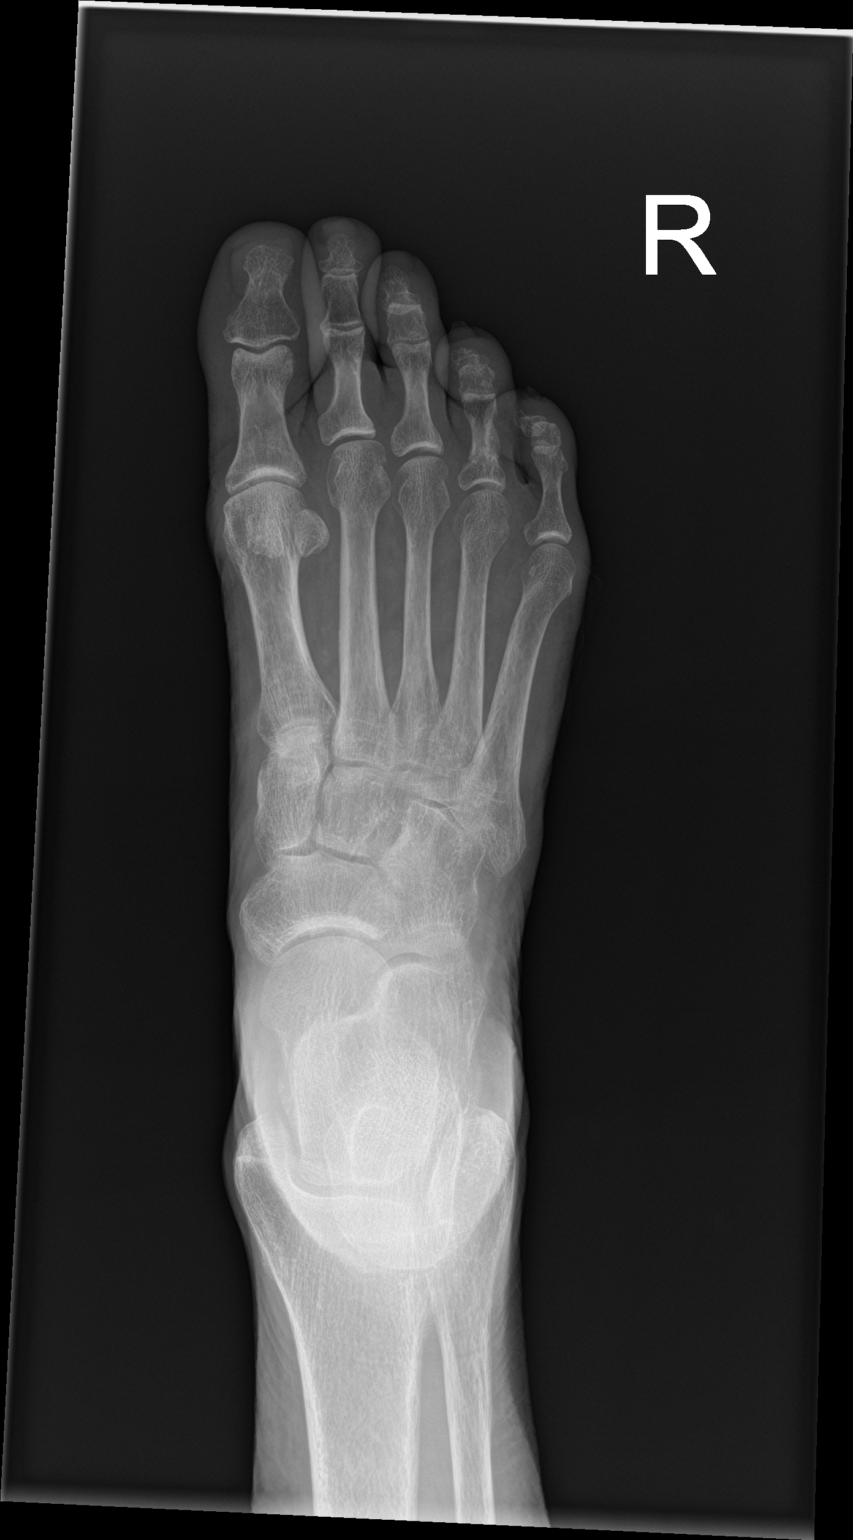

[foot obl]
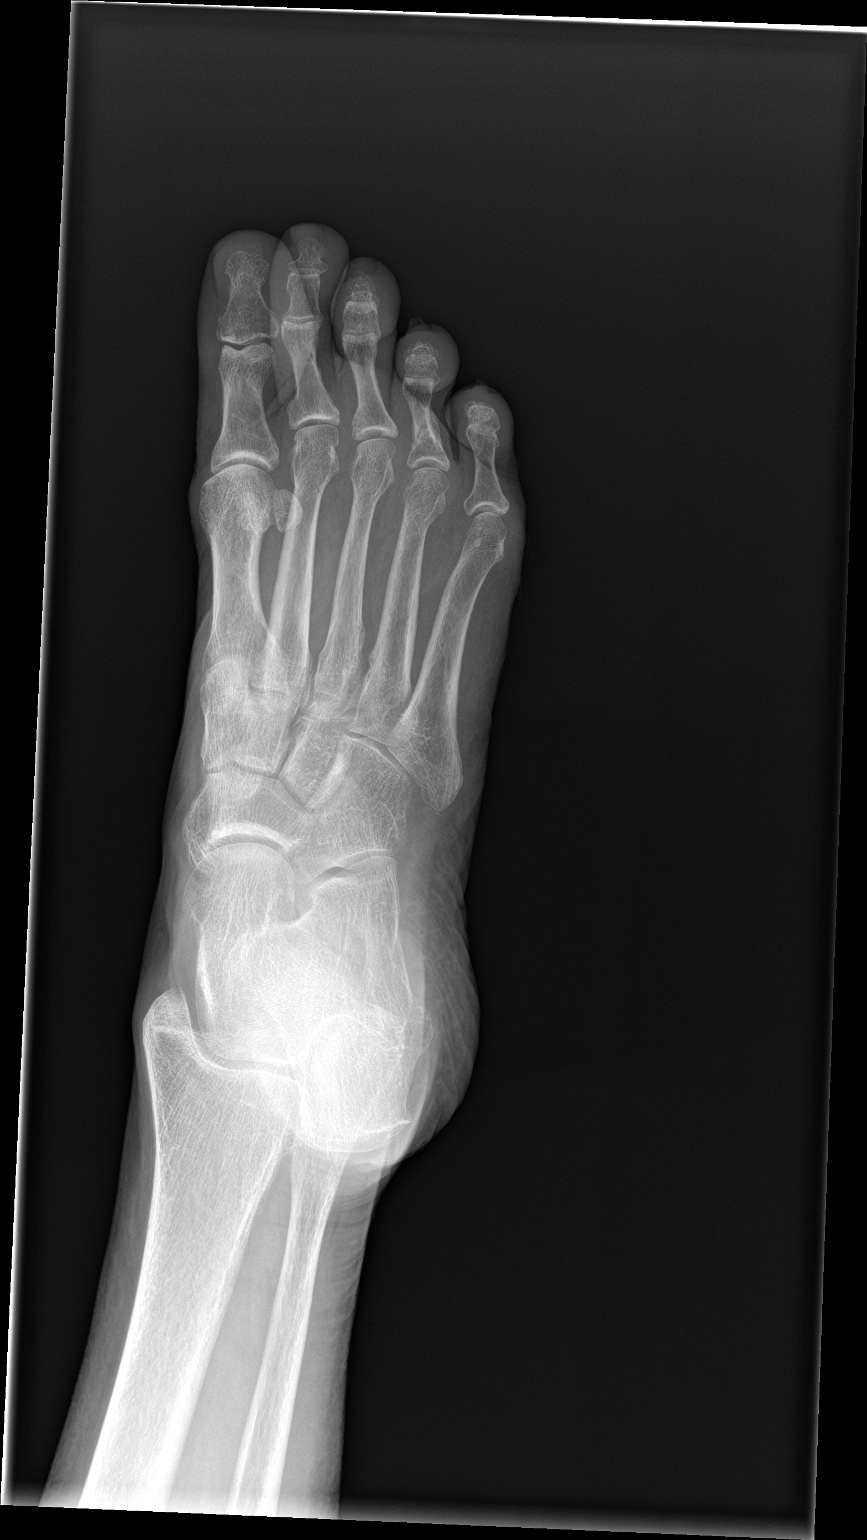

[foot lat]
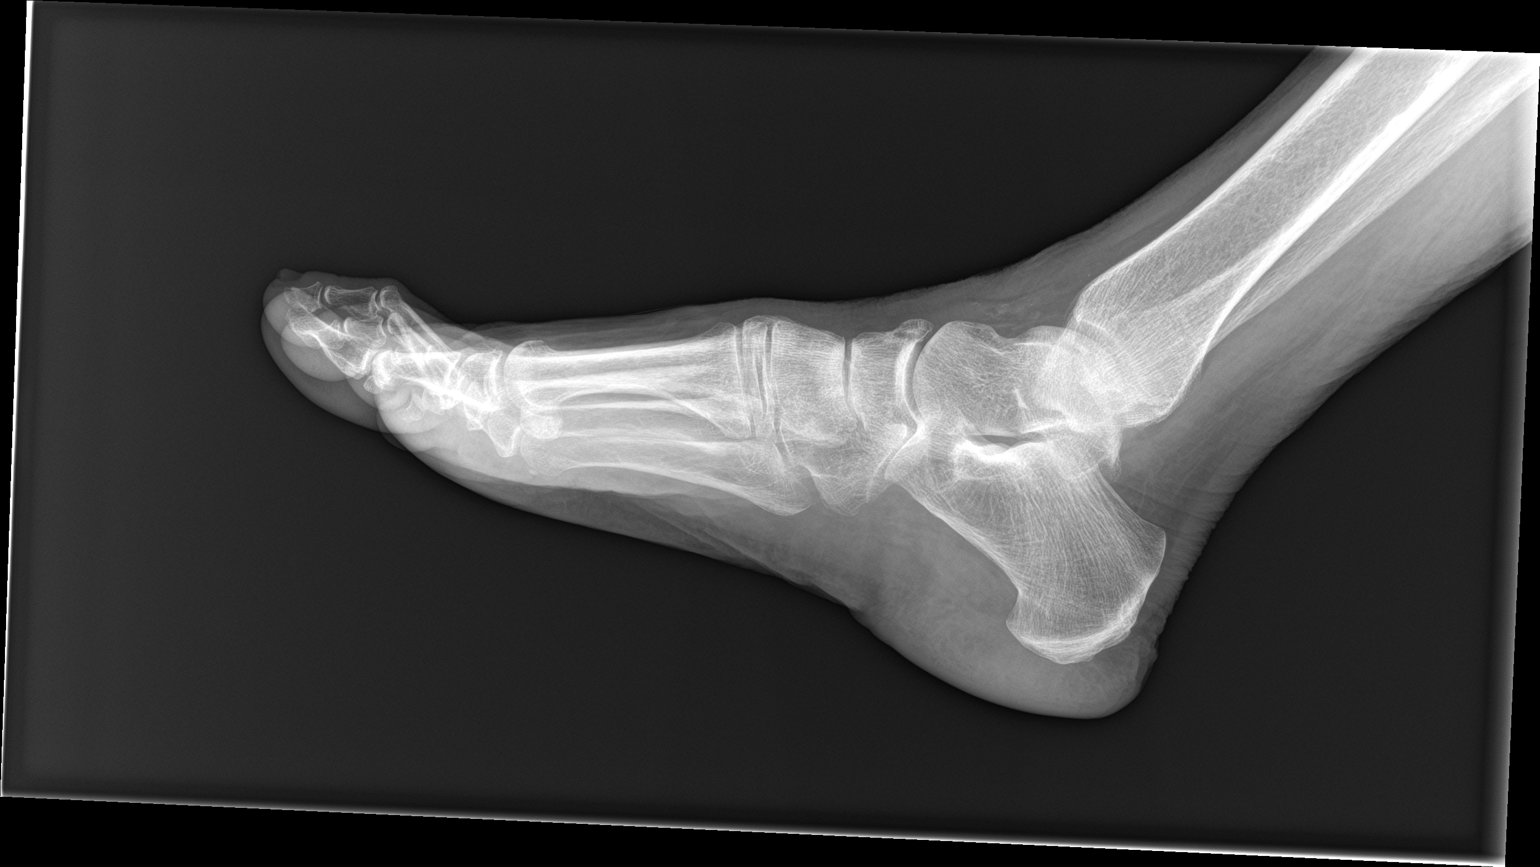

[3 of 3 positions shown; findings below may reference images not displayed]

FINDINGS: There is a there is a healing nondisplaced fracture of the fourth
toe proximal phalanx in unchanged alignment in comparison to prior
exam in [REDACTED]. There is no evidence of new acute fracture.
IMPRESSION: Healing nondisplaced fourth toe proximal phalanx fracture in
unchanged alignment. No new acute fracture.

## 2023-08-28 IMAGING — DX DG WRIST COMPLETE 3+V*R*
4 series · 4 of 4 positions shown · non-contrast
Comparison: None.

CLINICAL DATA: Swelling and pain

EXAM:
RIGHT WRIST - COMPLETE 3+ VIEW

[wrist pa (1 of 2)]
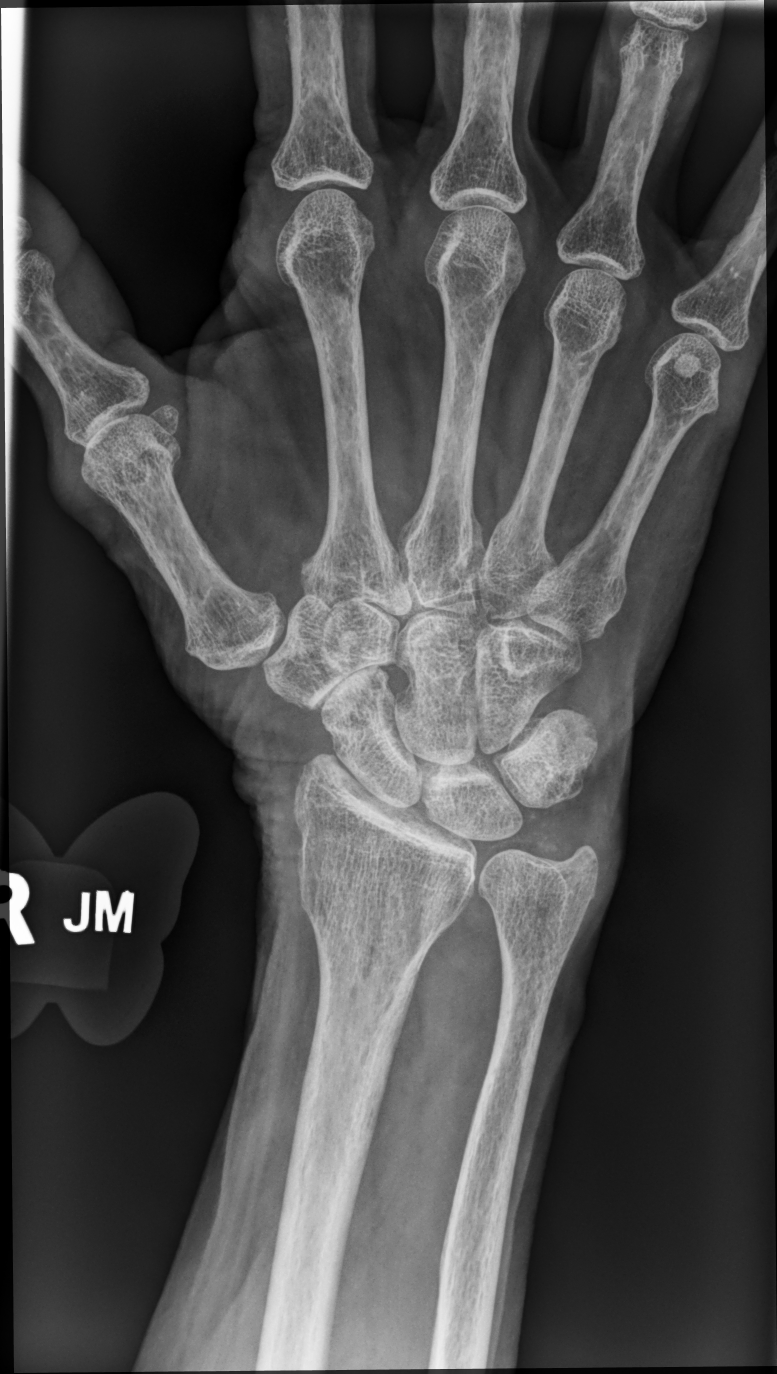

[wrist pa (2 of 2)]
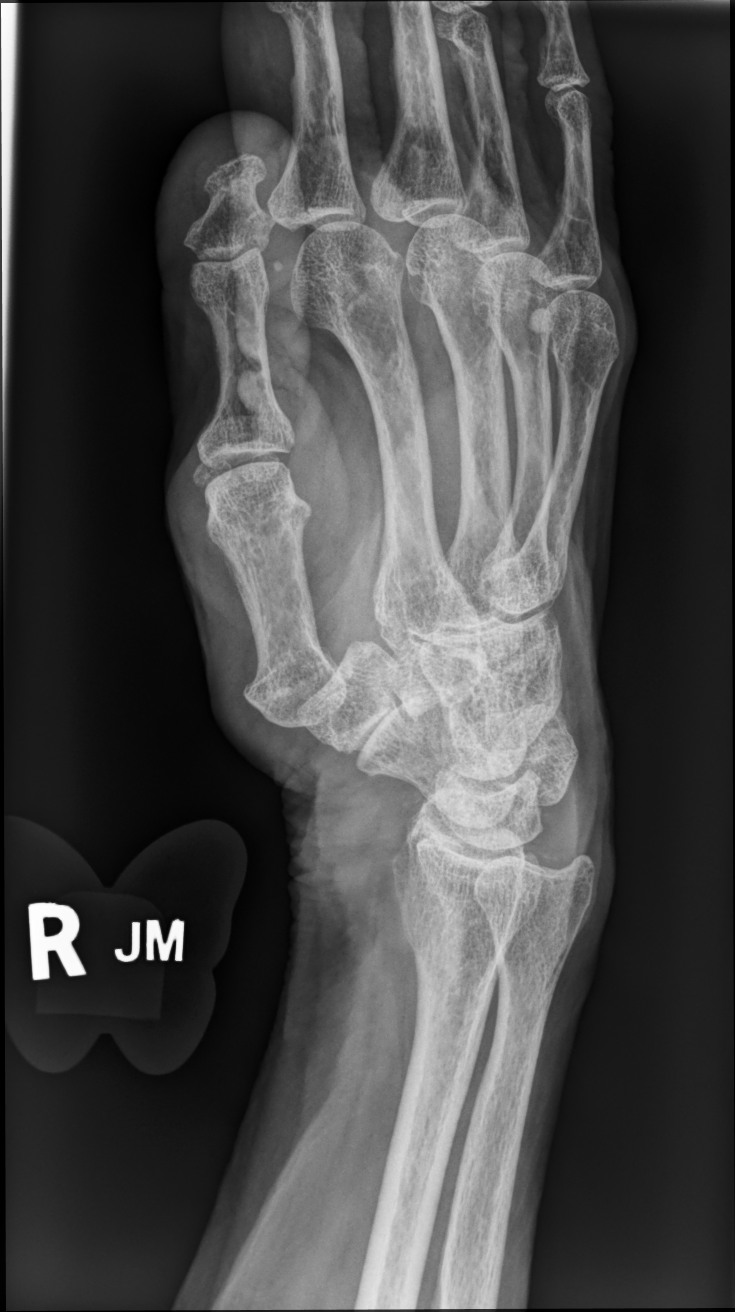

[wrist lat]
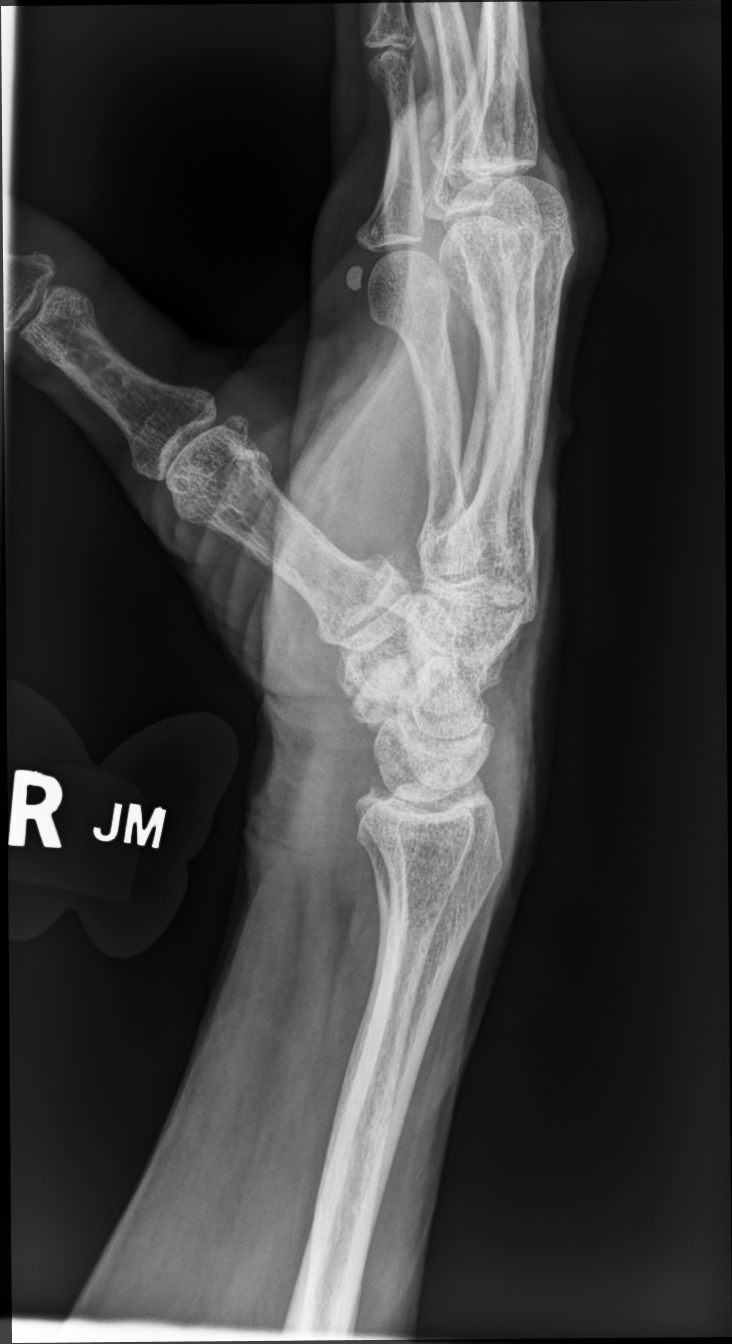

[scaphoid (os scaphoideum i) pa]
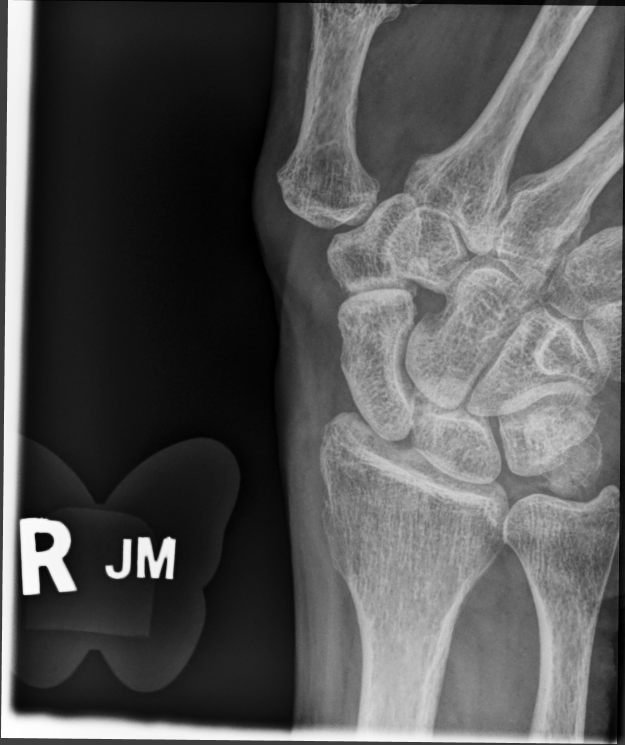

[4 of 4 positions shown; findings below may reference images not displayed]

FINDINGS: No acute fracture or dislocation. Mild-to-moderate degenerative
changes of the triscaphe joint. Mild degenerative changes of the
first CMC joint with mild subluxation. Soft tissues are
unremarkable.
IMPRESSION: No acute osseous abnormality.
